# Patient Record
Sex: Male | Born: 1955
Health system: Southern US, Community
[De-identification: ages and names within clinical notes are randomized; demographics above are authoritative.]

## PROBLEM LIST (undated history)

## (undated) DIAGNOSIS — N433 Hydrocele, unspecified: Secondary | ICD-10-CM

## (undated) DIAGNOSIS — F329 Major depressive disorder, single episode, unspecified: Secondary | ICD-10-CM

## (undated) DIAGNOSIS — I10 Essential (primary) hypertension: Secondary | ICD-10-CM

## (undated) DIAGNOSIS — F1020 Alcohol dependence, uncomplicated: Secondary | ICD-10-CM

## (undated) DIAGNOSIS — D61818 Other pancytopenia: Secondary | ICD-10-CM

## (undated) DIAGNOSIS — F32A Depression, unspecified: Secondary | ICD-10-CM

## (undated) DIAGNOSIS — C4490 Unspecified malignant neoplasm of skin, unspecified: Secondary | ICD-10-CM

## (undated) DIAGNOSIS — T7422XA Child sexual abuse, confirmed, initial encounter: Secondary | ICD-10-CM

## (undated) DIAGNOSIS — S32000A Wedge compression fracture of unspecified lumbar vertebra, initial encounter for closed fracture: Secondary | ICD-10-CM

## (undated) HISTORY — PX: SKIN CANCER EXCISION: SHX779

## (undated) HISTORY — DX: Essential (primary) hypertension: I10

## (undated) HISTORY — PX: CATARACT EXTRACTION: SUR2

## (undated) HISTORY — DX: Hydrocele, unspecified: N43.3

## (undated) HISTORY — DX: Unspecified malignant neoplasm of skin, unspecified: C44.90

## (undated) HISTORY — DX: Depression, unspecified: F32.A

## (undated) HISTORY — DX: Wedge compression fracture of unspecified lumbar vertebra, initial encounter for closed fracture: S32.000A

---
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
## 2004-01-04 ENCOUNTER — Encounter: Admission: RE | Admit: 2004-01-04 | Discharge: 2004-01-04 | Payer: Self-pay | Admitting: Family Medicine

## 2004-05-12 ENCOUNTER — Ambulatory Visit: Payer: Self-pay | Admitting: Family Medicine

## 2006-09-16 DIAGNOSIS — G8929 Other chronic pain: Secondary | ICD-10-CM

## 2006-09-16 DIAGNOSIS — M545 Low back pain, unspecified: Secondary | ICD-10-CM

## 2006-09-16 DIAGNOSIS — F1020 Alcohol dependence, uncomplicated: Secondary | ICD-10-CM | POA: Insufficient documentation

## 2006-09-16 DIAGNOSIS — N509 Disorder of male genital organs, unspecified: Secondary | ICD-10-CM | POA: Insufficient documentation

## 2006-09-16 DIAGNOSIS — F1010 Alcohol abuse, uncomplicated: Secondary | ICD-10-CM

## 2006-09-16 HISTORY — DX: Alcohol dependence, uncomplicated: F10.20

## 2006-09-16 HISTORY — DX: Low back pain, unspecified: M54.50

## 2007-04-26 ENCOUNTER — Emergency Department (HOSPITAL_COMMUNITY): Admission: EM | Admit: 2007-04-26 | Discharge: 2007-04-27 | Payer: Self-pay | Admitting: Emergency Medicine

## 2008-05-10 ENCOUNTER — Ambulatory Visit: Payer: Self-pay | Admitting: Internal Medicine

## 2008-05-10 LAB — CONVERTED CEMR LAB
Albumin: 4.1 g/dL (ref 3.5–5.2)
CO2: 25 meq/L (ref 19–32)
Calcium: 9.7 mg/dL (ref 8.4–10.5)
Eosinophils Relative: 3 % (ref 0–5)
Glucose, Bld: 66 mg/dL — ABNORMAL LOW (ref 70–99)
HCT: 43.8 % (ref 39.0–52.0)
Lymphocytes Relative: 34 % (ref 12–46)
Lymphs Abs: 2.3 10*3/uL (ref 0.7–4.0)
Platelets: 281 10*3/uL (ref 150–400)
Potassium: 4.5 meq/L (ref 3.5–5.3)
Sodium: 143 meq/L (ref 135–145)
Total Protein: 6.7 g/dL (ref 6.0–8.3)
WBC: 6.8 10*3/uL (ref 4.0–10.5)

## 2008-07-12 ENCOUNTER — Emergency Department (HOSPITAL_COMMUNITY): Admission: EM | Admit: 2008-07-12 | Discharge: 2008-07-12 | Payer: Self-pay | Admitting: Emergency Medicine

## 2008-07-20 HISTORY — PX: HYDROCELE EXCISION / REPAIR: SUR1145

## 2008-09-03 ENCOUNTER — Emergency Department (HOSPITAL_COMMUNITY): Admission: EM | Admit: 2008-09-03 | Discharge: 2008-09-04 | Payer: Self-pay | Admitting: Emergency Medicine

## 2008-12-07 ENCOUNTER — Emergency Department (HOSPITAL_COMMUNITY): Admission: EM | Admit: 2008-12-07 | Discharge: 2008-12-07 | Payer: Self-pay | Admitting: Emergency Medicine

## 2009-01-18 ENCOUNTER — Encounter (INDEPENDENT_AMBULATORY_CARE_PROVIDER_SITE_OTHER): Payer: Self-pay | Admitting: Urology

## 2009-01-18 ENCOUNTER — Ambulatory Visit (HOSPITAL_BASED_OUTPATIENT_CLINIC_OR_DEPARTMENT_OTHER): Admission: RE | Admit: 2009-01-18 | Discharge: 2009-01-18 | Payer: Self-pay | Admitting: Urology

## 2009-04-10 ENCOUNTER — Ambulatory Visit: Payer: Self-pay | Admitting: Internal Medicine

## 2009-04-10 LAB — CONVERTED CEMR LAB
Lymphs Abs: 1.4 10*3/uL (ref 0.7–4.0)
Monocytes Relative: 15 % — ABNORMAL HIGH (ref 3–12)
Neutro Abs: 1.8 10*3/uL (ref 1.7–7.7)
Neutrophils Relative %: 43 % (ref 43–77)
RBC: 3.87 M/uL — ABNORMAL LOW (ref 4.22–5.81)
WBC: 4.3 10*3/uL (ref 4.0–10.5)

## 2010-10-25 ENCOUNTER — Emergency Department (HOSPITAL_COMMUNITY)
Admission: EM | Admit: 2010-10-25 | Discharge: 2010-10-25 | Disposition: A | Discharge status: Home / Self Care | Payer: Self-pay | Attending: Emergency Medicine | Admitting: Emergency Medicine

## 2010-10-25 DIAGNOSIS — R000 Tachycardia, unspecified: Secondary | ICD-10-CM | POA: Insufficient documentation

## 2010-10-25 DIAGNOSIS — R29898 Other symptoms and signs involving the musculoskeletal system: Secondary | ICD-10-CM | POA: Insufficient documentation

## 2010-10-25 DIAGNOSIS — R209 Unspecified disturbances of skin sensation: Secondary | ICD-10-CM | POA: Insufficient documentation

## 2010-10-25 LAB — GLUCOSE, CAPILLARY: Glucose-Capillary: 77 mg/dL (ref 70–99)

## 2010-10-26 LAB — RAPID URINE DRUG SCREEN, HOSP PERFORMED
Amphetamines: NOT DETECTED
Barbiturates: NOT DETECTED

## 2010-10-28 LAB — DIFFERENTIAL
Blasts: 0 %
Metamyelocytes Relative: 0 %
Monocytes Relative: 14 % — ABNORMAL HIGH (ref 3–12)
Myelocytes: 0 %
Promyelocytes Absolute: 0 %
nRBC: 0 /100 WBC

## 2010-10-28 LAB — URINALYSIS, ROUTINE W REFLEX MICROSCOPIC
Glucose, UA: NEGATIVE mg/dL
Hgb urine dipstick: NEGATIVE
Specific Gravity, Urine: 1.023 (ref 1.005–1.030)
pH: 5.5 (ref 5.0–8.0)

## 2010-10-28 LAB — CBC
HCT: 38.7 % — ABNORMAL LOW (ref 39.0–52.0)
MCV: 95.5 fL (ref 78.0–100.0)
RBC: 4.05 MIL/uL — ABNORMAL LOW (ref 4.22–5.81)
WBC: 6 10*3/uL (ref 4.0–10.5)

## 2010-10-28 LAB — URINE MICROSCOPIC-ADD ON

## 2010-10-28 LAB — COMPREHENSIVE METABOLIC PANEL
AST: 26 U/L (ref 0–37)
Alkaline Phosphatase: 65 U/L (ref 39–117)
CO2: 27 mEq/L (ref 19–32)
Chloride: 103 mEq/L (ref 96–112)
Creatinine, Ser: 0.76 mg/dL (ref 0.4–1.5)
GFR calc Af Amer: >60 mL/min (ref >60)
GFR calc non Af Amer: >60 mL/min (ref >60)
Potassium: 3.6 mEq/L (ref 3.5–5.1)
Total Bilirubin: 1 mg/dL (ref 0.3–1.2)

## 2010-10-30 ENCOUNTER — Emergency Department (HOSPITAL_COMMUNITY)
Admission: EM | Admit: 2010-10-30 | Discharge: 2010-10-30 | Disposition: A | Discharge status: Home / Self Care | Payer: Self-pay | Attending: Emergency Medicine | Admitting: Emergency Medicine

## 2010-10-30 ENCOUNTER — Emergency Department (HOSPITAL_COMMUNITY): Payer: Self-pay

## 2010-10-30 DIAGNOSIS — R5381 Other malaise: Secondary | ICD-10-CM | POA: Insufficient documentation

## 2010-10-30 DIAGNOSIS — R209 Unspecified disturbances of skin sensation: Secondary | ICD-10-CM | POA: Insufficient documentation

## 2010-10-30 DIAGNOSIS — R5383 Other fatigue: Secondary | ICD-10-CM | POA: Insufficient documentation

## 2010-10-30 DIAGNOSIS — E860 Dehydration: Secondary | ICD-10-CM | POA: Insufficient documentation

## 2010-10-30 DIAGNOSIS — R0602 Shortness of breath: Secondary | ICD-10-CM | POA: Insufficient documentation

## 2010-10-30 DIAGNOSIS — F411 Generalized anxiety disorder: Secondary | ICD-10-CM | POA: Insufficient documentation

## 2010-10-30 DIAGNOSIS — R259 Unspecified abnormal involuntary movements: Secondary | ICD-10-CM | POA: Insufficient documentation

## 2010-10-30 DIAGNOSIS — I498 Other specified cardiac arrhythmias: Secondary | ICD-10-CM | POA: Insufficient documentation

## 2010-10-30 LAB — RAPID URINE DRUG SCREEN, HOSP PERFORMED: Barbiturates: NOT DETECTED

## 2010-10-30 LAB — CBC
HCT: 46.5 % (ref 39.0–52.0)
MCHC: 34.2 g/dL (ref 30.0–36.0)
MCV: 96.9 fL (ref 78.0–100.0)
Platelets: 179 10*3/uL (ref 150–400)
RDW: 12.8 % (ref 11.5–15.5)
WBC: 6.6 10*3/uL (ref 4.0–10.5)

## 2010-10-30 LAB — URINE MICROSCOPIC-ADD ON

## 2010-10-30 LAB — URINALYSIS, ROUTINE W REFLEX MICROSCOPIC
Glucose, UA: NEGATIVE mg/dL
Hgb urine dipstick: NEGATIVE
Specific Gravity, Urine: 1.026 (ref 1.005–1.030)
pH: 5.5 (ref 5.0–8.0)

## 2010-10-30 LAB — DIFFERENTIAL
Basophils Absolute: 0 10*3/uL (ref 0.0–0.1)
Eosinophils Absolute: 0.2 10*3/uL (ref 0.0–0.7)
Eosinophils Relative: 3 % (ref 0–5)
Monocytes Absolute: 0.6 10*3/uL (ref 0.1–1.0)

## 2010-10-30 LAB — COMPREHENSIVE METABOLIC PANEL
ALT: 57 U/L — ABNORMAL HIGH (ref 0–53)
Alkaline Phosphatase: 74 U/L (ref 39–117)
BUN: 7 mg/dL (ref 6–23)
CO2: 23 mEq/L (ref 19–32)
GFR calc non Af Amer: >60 mL/min (ref >60)
Glucose, Bld: 91 mg/dL (ref 70–99)
Potassium: 4.1 mEq/L (ref 3.5–5.1)
Sodium: 133 mEq/L — ABNORMAL LOW (ref 135–145)
Total Protein: 6.8 g/dL (ref 6.0–8.3)

## 2010-10-30 LAB — POCT CARDIAC MARKERS
CKMB, poc: 2 ng/mL (ref 1.0–8.0)
Myoglobin, poc: 45.4 ng/mL (ref 12–200)

## 2010-10-30 LAB — ETHANOL: Alcohol, Ethyl (B): <5 mg/dL (ref 0–10)

## 2010-11-04 LAB — DIFFERENTIAL
Basophils Absolute: 0.1 10*3/uL (ref 0.0–0.1)
Basophils Relative: 1 % (ref 0–1)
Lymphocytes Relative: 39 % (ref 12–46)
Neutro Abs: 3.6 10*3/uL (ref 1.7–7.7)

## 2010-11-04 LAB — CBC
MCHC: 33.4 g/dL (ref 30.0–36.0)
Platelets: 296 10*3/uL (ref 150–400)
RDW: 15 % (ref 11.5–15.5)

## 2010-11-04 LAB — BASIC METABOLIC PANEL
BUN: 12 mg/dL (ref 6–23)
GFR calc Af Amer: >60 mL/min (ref >60)
GFR calc non Af Amer: >60 mL/min (ref >60)
Potassium: 4.1 mEq/L (ref 3.5–5.1)

## 2010-11-04 LAB — ETHANOL
Alcohol, Ethyl (B): 441 mg/dL (ref 0–10)
Alcohol, Ethyl (B): 337 mg/dL — ABNORMAL HIGH (ref 0–10)

## 2010-11-04 LAB — RAPID URINE DRUG SCREEN, HOSP PERFORMED
Cocaine: NOT DETECTED
Tetrahydrocannabinol: NOT DETECTED

## 2010-12-02 NOTE — Op Note (Signed)
NAME:  Anthony [name], Anthony [name]                  ACCOUNT NO.:  [id number]   MEDICAL RECORD NO.:  [id number]          PATIENT TYPE:  AMB   LOCATION:  [facility]                         FACILITY:  [facility]   PHYSICIAN:  Ronald L. Earlene Plater, M.D.  DATE OF BIRTH:  01-Dec-1955   DATE OF PROCEDURE:  01/18/2009  DATE OF DISCHARGE:                               OPERATIVE REPORT   PREOPERATIVE DIAGNOSIS:  Large left hydrocele.   POSTOPERATIVE DIAGNOSIS:  Large left hydrocele.   OPERATIVE PROCEDURE:  Left hydrocele repair.   SURGEON:  Dr. Windy Fast L. Davis.   ANESTHESIA:  LMA.   ESTIMATED BLOOD LOSS:  25 mL.   DRAINS:  A 1/4 inch Penrose drain.   COMPLICATIONS:  None.   INDICATIONS FOR PROCEDURE:  Anthony [name] is a very nice 55 year old white  male who presents with a very enlarged scrotal sac on ultrasound and was  found to have a large left hydrocele.  He notes it is been present for  some time.  It is worsened and become unsightly and uncomfortable.  After understanding the risks, benefits and alternatives, he has elected  to proceed.  He has a small hydrocele on the right, which he has elected  to leave alone at present.   DESCRIPTION OF PROCEDURE:  The patient was placed in the supine  position.  After proper LMA anesthesia, was prepped and draped with  Betadine in a sterile fashion.  A transverse left hemiscrotal incision  was made.  Sharp dissection was carried down through dartos tunica.  The  tunica vaginalis was incised and approximately 700 mL of clear straw  fluid was obtained.  A large hydrocele sac was present that was  noncommunicating. The hydrocele sac was resected off of the cord and the  surrounding structures, and the bulk of the hydrocele sac was excised  and submitted for identification only to pathology.  Good hemostasis was  noted be present.  The remaining tunica vaginalis was bottlenecked  around the cord and the adnexa with running 3-0 chromic catgut, and  there were no pockets noted.   Good hemostasis was noted to be present.  The testicle appeared to be viable and was placed into the scrotal sac.  A thorough irrigation was performed.  A 1/4 inch Penrose drain was  placed through a separate stab incision and sutured in place with  chromic catgut.  The scrotum was then closed in two layers.  The dartos  tunica  was closed with a running 3-0 chromic catgut, and the skin was closed  with a running 3-0 chromic catgut, horizontal mattress-type suture.  The  wound was dressed with collodion and then fluffs and a scrotal support.   The patient tolerated the procedure well.  No complications.  He was  taken to the recovery room stable.      Ronald L. Earlene Plater, M.D.  Electronically Signed     RLD/MEDQ  D:  01/18/2009  T:  01/18/2009  Job:  [id number]

## 2011-04-30 LAB — BASIC METABOLIC PANEL
CO2: 25
Calcium: 8.7
Creatinine, Ser: 0.78
GFR calc Af Amer: >60
GFR calc non Af Amer: >60
Glucose, Bld: 103 — ABNORMAL HIGH
Sodium: 141

## 2011-04-30 LAB — I-STAT 8, (EC8 V) (CONVERTED LAB)
Chloride: 107
HCT: 52
Hemoglobin: 17.7 — ABNORMAL HIGH
Potassium: 4
Sodium: 140
TCO2: 26

## 2011-04-30 LAB — RAPID URINE DRUG SCREEN, HOSP PERFORMED
Barbiturates: NOT DETECTED
Opiates: NOT DETECTED

## 2011-04-30 LAB — CBC
Hemoglobin: 15.8
MCHC: 34.1
RBC: 4.65
RDW: 14.1 — ABNORMAL HIGH

## 2011-04-30 LAB — DIFFERENTIAL
Basophils Absolute: 0.1
Basophils Relative: 2 — ABNORMAL HIGH
Lymphocytes Relative: 43
Monocytes Absolute: 0.5
Neutro Abs: 2.4
Neutrophils Relative %: 39 — ABNORMAL LOW

## 2011-05-18 ENCOUNTER — Emergency Department (HOSPITAL_COMMUNITY): Payer: Worker's Compensation

## 2011-05-18 ENCOUNTER — Emergency Department (HOSPITAL_COMMUNITY)
Admission: EM | Admit: 2011-05-18 | Discharge: 2011-05-18 | Disposition: A | Discharge status: Home / Self Care | Payer: Worker's Compensation | Attending: Emergency Medicine | Admitting: Emergency Medicine

## 2011-05-18 DIAGNOSIS — Y990 Civilian activity done for income or pay: Secondary | ICD-10-CM | POA: Insufficient documentation

## 2011-05-18 DIAGNOSIS — M79609 Pain in unspecified limb: Secondary | ICD-10-CM | POA: Insufficient documentation

## 2011-05-18 DIAGNOSIS — W230XXA Caught, crushed, jammed, or pinched between moving objects, initial encounter: Secondary | ICD-10-CM | POA: Insufficient documentation

## 2011-05-18 DIAGNOSIS — IMO0002 Reserved for concepts with insufficient information to code with codable children: Secondary | ICD-10-CM | POA: Insufficient documentation

## 2011-05-29 ENCOUNTER — Telehealth (HOSPITAL_COMMUNITY): Payer: Self-pay | Admitting: *Deleted

## 2012-03-25 ENCOUNTER — Telehealth: Payer: Self-pay | Admitting: Family Medicine

## 2012-03-25 NOTE — Telephone Encounter (Signed)
Patient called in. He has not yet received a call about a new patient. He came this afternoon to speak to someone about his status and the office was closed. Please call the patient to update regarding this.

## 2012-04-11 ENCOUNTER — Encounter: Payer: Self-pay | Admitting: Family Medicine

## 2012-04-11 ENCOUNTER — Ambulatory Visit (INDEPENDENT_AMBULATORY_CARE_PROVIDER_SITE_OTHER): Payer: Self-pay | Admitting: Family Medicine

## 2012-04-11 VITALS — BP 138/98 | HR 82 | Temp 97.9°F | Ht 70.0 in | Wt 181.6 lb

## 2012-04-11 DIAGNOSIS — I10 Essential (primary) hypertension: Secondary | ICD-10-CM

## 2012-04-11 DIAGNOSIS — F1010 Alcohol abuse, uncomplicated: Secondary | ICD-10-CM

## 2012-04-11 MED ORDER — HYDROCHLOROTHIAZIDE 12.5 MG PO TABS: 12.5000 mg | ORAL_TABLET | Freq: Every day | ORAL | Status: DC

## 2012-04-11 NOTE — Assessment & Plan Note (Signed)
Multiple elevated blood pressures measured at plasma donation site as well as here today. Will start on HCTZ 12.5 mg. FU in 4-6 weeks for nurse visit to recheck blood pressure.

## 2012-04-11 NOTE — Patient Instructions (Addendum)
Come back in 4-6 weeks for a blood pressure check.   We can check some basic labs at that time as well once you have obtained the [facility].   Take the HCTZ 12.5 mg once daily.   It was good to meet you today.    Hypertension As your heart beats, it forces blood through your arteries. This force is your blood pressure. If the pressure is too high, it is called hypertension (HTN) or high blood pressure. HTN is dangerous because you may have it and not know it. High blood pressure may mean that your heart has to work harder to pump blood. Your arteries may be narrow or stiff. The extra work puts you at risk for heart disease, stroke, and other problems.  Blood pressure consists of two numbers, a higher number over a lower, 110/72, for example. It is stated as "110 over 72." The ideal is below 120 for the top number (systolic) and under 80 for the bottom (diastolic). Write down your blood pressure today. You should pay close attention to your blood pressure if you have certain conditions such as:  Heart failure.   Prior heart attack.   Diabetes   Chronic kidney disease.   Prior stroke.   Multiple risk factors for heart disease.  To see if you have HTN, your blood pressure should be measured while you are seated with your arm held at the level of the heart. It should be measured at least twice. A one-time elevated blood pressure reading (especially in the Emergency Department) does not mean that you need treatment. There may be conditions in which the blood pressure is different between your right and left arms. It is important to see your caregiver soon for a recheck. Most people have essential hypertension which means that there is not a specific cause. This type of high blood pressure may be lowered by changing lifestyle factors such as:  Stress.   Smoking.   Lack of exercise.   Excessive weight.   Drug/tobacco/alcohol use.   Eating less salt.  Most people do not have symptoms  from high blood pressure until it has caused damage to the body. Effective treatment can often prevent, delay or reduce that damage. TREATMENT  When a cause has been identified, treatment for high blood pressure is directed at the cause. There are a large number of medications to treat HTN. These fall into several categories, and your caregiver will help you select the medicines that are best for you. Medications may have side effects. You should review side effects with your caregiver. If your blood pressure stays high after you have made lifestyle changes or started on medicines,   Your medication(s) may need to be changed.   Other problems may need to be addressed.   Be certain you understand your prescriptions, and know how and when to take your medicine.   Be sure to follow up with your caregiver within the time frame advised (usually within two weeks) to have your blood pressure rechecked and to review your medications.   If you are taking more than one medicine to lower your blood pressure, make sure you know how and at what times they should be taken. Taking two medicines at the same time can result in blood pressure that is too low.  SEEK IMMEDIATE MEDICAL CARE IF:  You develop a severe headache, blurred or changing vision, or confusion.   You have unusual weakness or numbness, or a faint feeling.  You have severe chest or abdominal pain, vomiting, or breathing problems.  MAKE SURE YOU:   Understand these instructions.   Will watch your condition.   Will get help right away if you are not doing well or get worse.  Document Released: 07/06/2005 Document Revised: 06/25/2011 Document Reviewed: 02/24/2008 [facility] Patient Information 2012 [place], [place].

## 2012-04-11 NOTE — Assessment & Plan Note (Signed)
Drinking heavily this summer, however now down to 1-2 beers a week.  CAGE negative.   Will continue to screen at further visits.

## 2012-04-11 NOTE — Progress Notes (Signed)
Patient ID: Nell Range, male   DOB: 06-11-1956, 56 y.o.   MRN: [id number] Truth A Russman is a 56 y.o. male who presents to [facility] today to re-establish care.  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  Was denied any further plasma donation about 6 weeks ago due to elevated blood pressure.  He presents for care to have this checked.  Denies any other medical issues or concerns.    Elevated blood pressures:  Unclear how long he has had this problem. No formal diagnosis of HTN, but he has had increased blood pressures multiple visits at plasma donation site.  Not checking it regularly.  No HA, CP, dizziness, shortness of breath, palpitations, or LE swelling.   BP Readings from Last 3 Encounters:  04/11/12 138/98   The following portions of the patient's history were reviewed and updated as appropriate: allergies, current medications, past medical history, family and social history, and problem list.  Patient is a nonsmoker.  No past medical history on file.  ROS as above otherwise neg. No Chest pain, palpitations, SOB, Fever, Chills, Abd pain, N/V/D.   Medications reviewed. No current outpatient prescriptions on file.    Exam:  BP 157/99  Pulse 82  Temp 97.9 F (36.6 C) (Oral)  Ht 5\' 10"  (1.778 m)  Wt 181 lb 9.6 oz (82.373 kg)  BMI 26.06 kg/m2 Gen: Well NAD HEENT:  [place]/AT.  EOMI, PERRL.  MMM, tonsils non-erythematous, non-edematous.  External ears WNL, Bilateral TM's normal without retraction, redness or bulging.  Cardiac:  Regular rate and rhythm without murmur auscultated.  Good S1/S2. Pulm:  Clear to auscultation bilaterally with good air movement.  No wheezes or rales noted.   Abd:  Soft/nondistended/nontender.  Good bowel sounds throughout all four quadrants.  No masses noted.  Exts: Non edematous BL  LE, warm and well perfused.  Right LE with varicose veins noted, non-thrombosed, non painful,  nonerythematous.   Psych:  Pleasant, not depressed, not anxious appearing   No results found for this or any previous visit (from the past 72 hour(s)).

## 2012-04-25 ENCOUNTER — Ambulatory Visit (INDEPENDENT_AMBULATORY_CARE_PROVIDER_SITE_OTHER): Payer: Self-pay | Admitting: *Deleted

## 2012-04-25 VITALS — BP 144/92 | HR 84

## 2012-04-25 DIAGNOSIS — I10 Essential (primary) hypertension: Secondary | ICD-10-CM

## 2012-04-25 NOTE — Progress Notes (Signed)
Patient notified and appointment scheduled for 05/09/2012.

## 2012-04-25 NOTE — Progress Notes (Signed)
Patient in for BP check as requested by Dr. Gwendolyn Grant at last visit. BP checked manually using regular adult cuff. BP RA 144/92 and LA 140/96 pulse 84. Taking medication as directed. Will forward to MD and call patient  back at [phone].

## 2012-04-25 NOTE — Progress Notes (Signed)
Will continue medication at current dosage.  Will follow up at next visit, if still elevated will consider increasing dose at that time.

## 2012-05-09 ENCOUNTER — Encounter: Payer: Self-pay | Admitting: Family Medicine

## 2012-05-09 ENCOUNTER — Ambulatory Visit (INDEPENDENT_AMBULATORY_CARE_PROVIDER_SITE_OTHER): Payer: Self-pay | Admitting: Family Medicine

## 2012-05-09 VITALS — BP 122/78 | HR 79 | Temp 98.0°F | Ht 70.0 in | Wt 175.0 lb

## 2012-05-09 DIAGNOSIS — I10 Essential (primary) hypertension: Secondary | ICD-10-CM

## 2012-05-09 DIAGNOSIS — F1010 Alcohol abuse, uncomplicated: Secondary | ICD-10-CM

## 2012-05-09 MED ORDER — HYDROCHLOROTHIAZIDE 12.5 MG PO CAPS: 12.5000 mg | ORAL_CAPSULE | Freq: Every day | ORAL | Status: DC

## 2012-05-09 MED ORDER — HYDROCHLOROTHIAZIDE 25 MG PO TABS: 25.0000 mg | ORAL_TABLET | Freq: Every day | ORAL | Status: DC

## 2012-05-09 NOTE — Patient Instructions (Signed)
Call here to speak to Britta Mccreedy to get the [employer] up.  We have increased your dose of HCTZ (blood pressure medicine) to 25 mg daily.  Take 2 of your current pills until they are gone and then you can start the new prescription.  Follow up with me in 6 - 8 weeks, sometime around Christmas.  It was good to see you today.

## 2012-05-09 NOTE — Assessment & Plan Note (Signed)
Patient states he "still drinks some."   CAGE negative. States he has never been late for work as long as he's been working (most of his life).   Does not feel alcohol negatively affects his life. Will continue to screen at follow-up visits.

## 2012-05-09 NOTE — Progress Notes (Signed)
  Subjective:    Patient ID: Anthony [name], male    DOB: Jan 28, 1956, 56 y.o.   MRN: [id number]  HPI  1.  Hypertension:  Long-term problem for this patient.  No adverse effects from medication, just started on HCTZ 12.5 mg last visit after not being on BP medications prior.  Seen at nurse visit on 10/7 with BP as below.  Has "missed a few pills" since nurse visit.  Not checking it regularly on his own.  No HA, CP, dizziness, shortness of breath, palpitations, or LE swelling.   BP Readings from Last 3 Encounters:  05/09/12 151/95  04/25/12 144/92  04/11/12 138/98    Review of Systems See HPI above for review of systems.       Objective:   Physical Exam Gen:  Alert, cooperative patient who appears stated age in no acute distress.  Vital signs reviewed. Cardiac:  Regular rate and rhythm without murmur auscultated.  Good S1/S2. Pulm:  Clear to auscultation bilaterally with good air movement.  No wheezes or rales noted.   Abd:  Soft/nondistended/nontender.  Good bowel sounds throughout all four quadrants.  No masses noted.  Ext:  No edema noted bilateral lower extremities.          Assessment & Plan:

## 2012-05-09 NOTE — Assessment & Plan Note (Addendum)
Repeat BP at goal. 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 Continue HCTZ at 12.5 mg.  FU in 6 - 8 weeks around Christmas time for recheck.

## 2012-08-04 ENCOUNTER — Emergency Department (HOSPITAL_COMMUNITY)
Admission: EM | Admit: 2012-08-04 | Discharge: 2012-08-04 | Disposition: A | Discharge status: Home / Self Care | Payer: Self-pay | Attending: Emergency Medicine | Admitting: Emergency Medicine

## 2012-08-04 ENCOUNTER — Emergency Department (HOSPITAL_COMMUNITY): Payer: Self-pay

## 2012-08-04 ENCOUNTER — Encounter (HOSPITAL_COMMUNITY): Payer: Self-pay | Admitting: *Deleted

## 2012-08-04 DIAGNOSIS — I10 Essential (primary) hypertension: Secondary | ICD-10-CM | POA: Insufficient documentation

## 2012-08-04 DIAGNOSIS — Z87891 Personal history of nicotine dependence: Secondary | ICD-10-CM | POA: Insufficient documentation

## 2012-08-04 DIAGNOSIS — J069 Acute upper respiratory infection, unspecified: Secondary | ICD-10-CM | POA: Insufficient documentation

## 2012-08-04 DIAGNOSIS — F1010 Alcohol abuse, uncomplicated: Secondary | ICD-10-CM

## 2012-08-04 DIAGNOSIS — Z79899 Other long term (current) drug therapy: Secondary | ICD-10-CM | POA: Insufficient documentation

## 2012-08-04 DIAGNOSIS — R079 Chest pain, unspecified: Secondary | ICD-10-CM

## 2012-08-04 DIAGNOSIS — Z7982 Long term (current) use of aspirin: Secondary | ICD-10-CM | POA: Insufficient documentation

## 2012-08-04 LAB — CBC WITH DIFFERENTIAL/PLATELET
Basophils Absolute: 0 10*3/uL (ref 0.0–0.1)
Basophils Relative: 1 % (ref 0–1)
Eosinophils Absolute: 0 10*3/uL (ref 0.0–0.7)
Eosinophils Relative: 1 % (ref 0–5)
HCT: 45.3 % (ref 39.0–52.0)
Hemoglobin: 15.8 g/dL (ref 13.0–17.0)
Lymphocytes Relative: 32 % (ref 12–46)
Lymphs Abs: 2.8 10*3/uL (ref 0.7–4.0)
MCH: 33.8 pg (ref 26.0–34.0)
MCHC: 34.9 g/dL (ref 30.0–36.0)
MCV: 97 fL (ref 78.0–100.0)
Monocytes Absolute: 0.9 10*3/uL (ref 0.1–1.0)
Monocytes Relative: 11 % (ref 3–12)
Neutro Abs: 4.8 10*3/uL (ref 1.7–7.7)
Neutrophils Relative %: 56 % (ref 43–77)
Platelets: 296 10*3/uL (ref 150–400)
RBC: 4.67 MIL/uL (ref 4.22–5.81)
RDW: 12.3 % (ref 11.5–15.5)
WBC: 8.5 10*3/uL (ref 4.0–10.5)

## 2012-08-04 LAB — COMPREHENSIVE METABOLIC PANEL WITH GFR
ALT: 22 U/L (ref 0–53)
AST: 38 U/L — ABNORMAL HIGH (ref 0–37)
Albumin: 3.4 g/dL — ABNORMAL LOW (ref 3.5–5.2)
Alkaline Phosphatase: 60 U/L (ref 39–117)
BUN: 9 mg/dL (ref 6–23)
CO2: 25 meq/L (ref 19–32)
Calcium: 8.7 mg/dL (ref 8.4–10.5)
Chloride: 102 meq/L (ref 96–112)
Creatinine, Ser: 0.72 mg/dL (ref 0.50–1.35)
GFR calc Af Amer: >90 mL/min
GFR calc non Af Amer: >90 mL/min
Glucose, Bld: 97 mg/dL (ref 70–99)
Potassium: 3.9 meq/L (ref 3.5–5.1)
Sodium: 142 meq/L (ref 135–145)
Total Bilirubin: 0.2 mg/dL — ABNORMAL LOW (ref 0.3–1.2)
Total Protein: 6.8 g/dL (ref 6.0–8.3)

## 2012-08-04 MED ORDER — LORAZEPAM 1 MG PO TABS: 1.0000 mg | ORAL_TABLET | Freq: Three times a day (TID) | ORAL | Status: DC | PRN

## 2012-08-04 NOTE — ED Notes (Signed)
EDP at bedside  

## 2012-08-04 NOTE — ED Notes (Addendum)
Pt with sore throat and malaise since 12/24.  However, cough/myalgias/weakness and sore throat increased markedly last week to where he was unable to go to work.  Yesterday he noticed swelling to the lymph nodes below R jaw.  Pt states he has not been able to cough since yesterday d/t increased pain to L lat chest when coughing.

## 2012-08-04 NOTE — ED Notes (Signed)
Security at door.

## 2012-08-04 NOTE — ED Notes (Signed)
Patient transported to X-ray 

## 2012-08-04 NOTE — ED Notes (Addendum)
Pt seems to be aggravated, stating he is "pissed off" states he has been waiting for 3 hrs, pt has been in ED for a total of 1.5 hrs at this point. He is not appropriate for situation, verbally or physically.

## 2012-08-04 NOTE — ED Notes (Addendum)
Answered call bell. Patient expressing displeasure that "nobody has been to see me yet". Explained to patient that MD would be with him within . Patient stated that he was angry and that if he could he would get out of bed and "pop you in the face". De-escalation technique used. Patient still verbally abusive. 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

## 2012-08-05 ENCOUNTER — Emergency Department (HOSPITAL_COMMUNITY): Payer: Self-pay

## 2012-08-05 ENCOUNTER — Emergency Department (HOSPITAL_COMMUNITY)
Admission: EM | Admit: 2012-08-05 | Discharge: 2012-08-05 | Discharge status: Against Medical Advice | Payer: Self-pay | Attending: Emergency Medicine | Admitting: Emergency Medicine

## 2012-08-05 ENCOUNTER — Encounter (HOSPITAL_COMMUNITY): Payer: Self-pay | Admitting: Family Medicine

## 2012-08-05 DIAGNOSIS — R42 Dizziness and giddiness: Secondary | ICD-10-CM | POA: Insufficient documentation

## 2012-08-05 DIAGNOSIS — S060X9A Concussion with loss of consciousness of unspecified duration, initial encounter: Secondary | ICD-10-CM | POA: Insufficient documentation

## 2012-08-05 DIAGNOSIS — R079 Chest pain, unspecified: Secondary | ICD-10-CM | POA: Insufficient documentation

## 2012-08-05 DIAGNOSIS — Z87891 Personal history of nicotine dependence: Secondary | ICD-10-CM | POA: Insufficient documentation

## 2012-08-05 DIAGNOSIS — I10 Essential (primary) hypertension: Secondary | ICD-10-CM | POA: Insufficient documentation

## 2012-08-05 DIAGNOSIS — W108XXA Fall (on) (from) other stairs and steps, initial encounter: Secondary | ICD-10-CM | POA: Insufficient documentation

## 2012-08-05 DIAGNOSIS — R55 Syncope and collapse: Secondary | ICD-10-CM | POA: Insufficient documentation

## 2012-08-05 DIAGNOSIS — Y9301 Activity, walking, marching and hiking: Secondary | ICD-10-CM | POA: Insufficient documentation

## 2012-08-05 DIAGNOSIS — R112 Nausea with vomiting, unspecified: Secondary | ICD-10-CM | POA: Insufficient documentation

## 2012-08-05 DIAGNOSIS — F1010 Alcohol abuse, uncomplicated: Secondary | ICD-10-CM | POA: Insufficient documentation

## 2012-08-05 DIAGNOSIS — Z79899 Other long term (current) drug therapy: Secondary | ICD-10-CM | POA: Insufficient documentation

## 2012-08-05 DIAGNOSIS — J3489 Other specified disorders of nose and nasal sinuses: Secondary | ICD-10-CM | POA: Insufficient documentation

## 2012-08-05 DIAGNOSIS — S0990XA Unspecified injury of head, initial encounter: Secondary | ICD-10-CM

## 2012-08-05 DIAGNOSIS — Y92009 Unspecified place in unspecified non-institutional (private) residence as the place of occurrence of the external cause: Secondary | ICD-10-CM | POA: Insufficient documentation

## 2012-08-05 DIAGNOSIS — R509 Fever, unspecified: Secondary | ICD-10-CM | POA: Insufficient documentation

## 2012-08-05 LAB — CBC
MCH: 34.6 pg — ABNORMAL HIGH (ref 26.0–34.0)
MCHC: 35.2 g/dL (ref 30.0–36.0)
MCV: 98.3 fL (ref 78.0–100.0)
Platelets: 227 10*3/uL (ref 150–400)
RBC: 4.13 MIL/uL — ABNORMAL LOW (ref 4.22–5.81)
RDW: 12.5 % (ref 11.5–15.5)

## 2012-08-05 LAB — COMPREHENSIVE METABOLIC PANEL
ALT: 20 U/L (ref 0–53)
AST: 36 U/L (ref 0–37)
Albumin: 2.9 g/dL — ABNORMAL LOW (ref 3.5–5.2)
Calcium: 7.6 mg/dL — ABNORMAL LOW (ref 8.4–10.5)
Creatinine, Ser: 0.72 mg/dL (ref 0.50–1.35)
GFR calc non Af Amer: >90 mL/min (ref >90)
Sodium: 143 mEq/L (ref 135–145)
Total Protein: 6.2 g/dL (ref 6.0–8.3)

## 2012-08-05 LAB — ETHANOL: Alcohol, Ethyl (B): 385 mg/dL — ABNORMAL HIGH (ref 0–11)

## 2012-08-05 LAB — GLUCOSE, CAPILLARY: Glucose-Capillary: 81 mg/dL (ref 70–99)

## 2012-08-05 MED ORDER — SODIUM CHLORIDE 0.9 % IV BOLUS (SEPSIS)
1000.0000 mL | Freq: Once | INTRAVENOUS | Status: AC
  Administered 2012-08-05: 1000 mL via INTRAVENOUS

## 2012-08-05 MED ORDER — SODIUM CHLORIDE 0.9 % IV BOLUS (SEPSIS): 1000.0000 mL | Freq: Once | INTRAVENOUS | Status: DC

## 2012-08-05 NOTE — ED Notes (Signed)
Per pt sts was discharged from here yesterday and walked all the way home. sts today he was really sick and passed out and hit his head. Pt also admits to drinking ETOH. sts that we should have never sent him home that he was really sick and he is going to sue. Pt has abrasion to right forehead and sts pain in right jaw. sts also chest pain and it hurts to cough and take a deep breath. Pt keeps repeating himself.

## 2012-08-05 NOTE — ED Notes (Signed)
CBG 81.  

## 2012-08-05 NOTE — ED Provider Notes (Signed)
History     CSN: [id number]  Arrival date & time 08/05/12  1826   First MD Initiated Contact with Patient 08/05/12 1834      Chief Complaint  Patient presents with  . Loss of Consciousness    (Consider location/radiation/quality/duration/timing/severity/associated sxs/prior treatment) HPI Comments: 57 y/o male h/o htn p/w syncope. Patient admits to etoh today. Was at home when he fell down 5 steps. Also reports some chest pain, neck pain, n/v/d. History limited 2/2 etoh.  Patient is a 57 y.o. male presenting with fall. The history is provided by the patient and the EMS personnel. The history is limited by the condition of the patient.  Fall Incident onset: patient states he is not sure when he fell. The fall occurred while walking. Distance fallen: down 5 stairs. There was no blood loss. Point of impact: uncertain. Pain location: left chest pain. The pain is moderate. There was alcohol use involved in the accident. Associated symptoms include a fever (subjective), nausea, vomiting and loss of consciousness. Pertinent negatives include no abdominal pain, no hematuria and no headaches. Treatment on scene includes IV fluid.    Past Medical History  Diagnosis Date  . Hypertension     Past Surgical History  Procedure Date  . Hydrocele excision / repair 2010    History reviewed. No pertinent family history.  History  Substance Use Topics  . 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 Smokeless tobacco: Never Used  . Alcohol Use: 25.2 oz/week    42 Cans of beer per week     Comment: and liquor on the weekends      Review of Systems  Constitutional: Positive for fever (subjective). Negative for chills.  HENT: Positive for congestion and rhinorrhea. Negative for neck pain and neck stiffness.   Eyes: Negative for visual disturbance.  Respiratory: Negative for cough and shortness of breath.   Cardiovascular: Positive for chest pain. Negative for leg swelling.  Gastrointestinal: Positive  for nausea and vomiting. Negative for abdominal pain.  Genitourinary: Negative for dysuria, hematuria, flank pain and difficulty urinating.  Neurological: Positive for loss of consciousness, syncope and light-headedness. Negative for weakness and headaches.  All other systems reviewed and are negative.    Allergies  Review of patient's allergies indicates no known allergies.  Home Medications   Current Outpatient Rx  Name  Route  Sig  Dispense  Refill  . ACETAMINOPHEN 325 MG PO TABS   Oral   Take 650 mg by mouth at bedtime as needed. For pain         . ASPIRIN EC 325 MG PO TBEC   Oral   Take 325 mg by mouth at bedtime as needed. For pain         . HYDROCHLOROTHIAZIDE 12.5 MG PO CAPS   Oral   Take 12.5 mg by mouth every morning.           BP 140/86  Pulse 120  Resp 16  SpO2 98%  Physical Exam  Nursing note and vitals reviewed. Constitutional: He is oriented to person, place, and time. He appears well-developed. No distress.  HENT:  Head: Normocephalic.  Mouth/Throat: Oropharynx is clear and moist.       Mild abrasion/ecchymosis to right forehead.  Eyes: Conjunctivae normal and EOM are normal. Pupils are equal, round, and reactive to light.  Neck: No tracheal deviation present.  Cardiovascular: Regular rhythm, normal heart sounds and intact distal pulses.        tachycardic  Pulmonary/Chest: Effort normal  and breath sounds normal. No stridor. No respiratory distress. He has no wheezes. He has no rales. He exhibits no tenderness.  Abdominal: Soft. He exhibits no distension. There is no tenderness. There is no rebound and no guarding.  Musculoskeletal: He exhibits no edema and no tenderness.       Cervical back: Normal. He exhibits no tenderness, no bony tenderness, no deformity and no laceration.       Thoracic back: Normal. He exhibits no tenderness, no bony tenderness, no deformity and no laceration.       Lumbar back: Normal. He exhibits no tenderness, no bony  tenderness, no deformity and no laceration.  Neurological: He is alert and oriented to person, place, and time.  Skin: Skin is warm and dry. He is not diaphoretic.    ED Course  Procedures (including critical care time)  Labs Reviewed  CBC - Abnormal; Notable for the following:    RBC 4.13 (*)     MCH 34.6 (*)     All other components within normal limits  COMPREHENSIVE METABOLIC PANEL - Abnormal; Notable for the following:    Glucose, Bld 122 (*)     Calcium 7.6 (*)     Albumin 2.9 (*)     Total Bilirubin 0.2 (*)     All other components within normal limits  ETHANOL - Abnormal; Notable for the following:    Alcohol, Ethyl (B) 385 (*)     All other components within normal limits  TROPONIN I  GLUCOSE, CAPILLARY   Dg Chest 2 View  08/04/2012  *RADIOLOGY REPORT*  Clinical Data: Upper respiratory infection.  Cough, chest pain.  CHEST - 2 VIEW  Comparison: 10/30/2010  Findings: Heart and mediastinal contours are within normal limits. No focal opacities or effusions.  No acute bony abnormality.  IMPRESSION: No active cardiopulmonary disease.   Original Report Authenticated By: Charlett Nose, M.D.    Ct Head Wo Contrast  08/05/2012  *RADIOLOGY REPORT*  Clinical Data:  57 year old male with syncope and head and neck injury.  Headache and neck pain.  CT HEAD WITHOUT CONTRAST CT CERVICAL SPINE WITHOUT CONTRAST  Technique:  Multidetector CT imaging of the head and cervical spine was performed following the standard protocol without intravenous contrast.  Multiplanar CT image reconstructions of the cervical spine were also generated.  Comparison:  None  CT HEAD  Findings: No intracranial abnormalities are identified, including mass lesion or mass effect, hydrocephalus, extra-axial fluid collection, midline shift, hemorrhage, or acute infarction.  The visualized bony calvarium is unremarkable.  IMPRESSION: No evidence of intracranial or acute abnormality.  CT CERVICAL SPINE  Findings: Normal  alignment is noted. There is no evidence of fracture, subluxation or prevertebral soft tissue swelling. Mild to moderate multilevel degenerative disc disease and spondylosis noted. No focal bony lesions are identified. The soft tissue structures are unremarkable.  IMPRESSION: No static evidence of acute injury to the cervical spine.  Mild to moderate multilevel degenerative changes.   Original Report Authenticated By: Harmon Pier, M.D.    Ct Cervical Spine Wo Contrast  08/05/2012  *RADIOLOGY REPORT*  Clinical Data:  57 year old male with syncope and head and neck injury.  Headache and neck pain.  CT HEAD WITHOUT CONTRAST CT CERVICAL SPINE WITHOUT CONTRAST  Technique:  Multidetector CT imaging of the head and cervical spine was performed following the standard protocol without intravenous contrast.  Multiplanar CT image reconstructions of the cervical spine were also generated.  Comparison:  None  CT HEAD  Findings:  No intracranial abnormalities are identified, including mass lesion or mass effect, hydrocephalus, extra-axial fluid collection, midline shift, hemorrhage, or acute infarction.  The visualized bony calvarium is unremarkable.  IMPRESSION: No evidence of intracranial or acute abnormality.  CT CERVICAL SPINE  Findings: Normal alignment is noted. There is no evidence of fracture, subluxation or prevertebral soft tissue swelling. Mild to moderate multilevel degenerative disc disease and spondylosis noted. No focal bony lesions are identified. The soft tissue structures are unremarkable.  IMPRESSION: No static evidence of acute injury to the cervical spine.  Mild to moderate multilevel degenerative changes.   Original Report Authenticated By: Harmon Pier, M.D.    Dg Chest Portable 1 View  08/05/2012  *RADIOLOGY REPORT*  Clinical Data: Loss of consciousness  PORTABLE CHEST - 1 VIEW  Comparison: 08/04/2012  Findings: Low lung volumes.  Heart size within normal limits for portable technique.  Mediastinal and  hilar contours normal.  The lungs are clear.  No visible pleural effusion or pneumothorax.  No acute osseous abnormality.  IMPRESSION: Low lung volumes.  No acute findings.   Original Report Authenticated By: Britta Mccreedy, M.D.      1. Alcohol abuse, unspecified   2. Syncope   3. Head injury     Date: 08/05/2012  Rate: 121  Rhythm: sinus tachycardia  QRS Axis: normal  Intervals: normal  ST/T Wave abnormalities: normal  Conduction Disutrbances:none  Narrative Interpretation:   Old EKG Reviewed: unchanged     MDM   57 y/o male p/w syncope. Reports etoh use. Abrasion to head. Now A&Ox4. Uncertain source of syncope. Likely 2/2 etoh use.  EKG as above. Unremarkable. Patient denies any active chest pain. Reportedly had some yesterday. No diaphoresis or SOB. Troponin negative. Doubt ACS. CT head and c-spine negative for injury or intracranial pathology.  No reported h/o seizure like activity. No h/o sz's. Patient asking to go home. Does not want to wait for labs.  Discussed with patient importance of staying for further w/u of his syncope and tachycardia. Patient refusing to stay. Is A&Ox4. Has decision making capacity. Refused to sign AMA papers. Discussed risks with patient. Patient left ED without paperwork.  Labs and imaging reviewed by myself and considered in medical decision making if ordered. Imaging interpreted by radiology.   Discussed case with Dr. Rubin Payor who is in agreement with assessment and plan.      Stevie Kern, MD 08/06/12 [phone]

## 2012-08-05 NOTE — ED Notes (Addendum)
After speaking with ED Resident and EDP, Pt advocate and myself, pt has decided to leave AMA. Pt states taking bus to get home. EDP and ED Resident aware.

## 2012-08-05 NOTE — ED Notes (Signed)
Per EMS, pt had syncopal episode earlier and hit head. ETOH on board. Was here yesterday for flu-like symptoms. sts SOB, V,D. Per EMS, pt orthostatic with HR in the 150s upon standing. IV left hand. sts chest and jaw hurt.

## 2012-08-05 NOTE — ED Notes (Signed)
Patient transported to CT 

## 2012-08-05 NOTE — ED Notes (Addendum)
Pt being inappropriate with staff, pt redirected multiple times and given explanations for lab work, radiology exams, and vital signs. Pt declining lab work at this time, pt states "I came here because I am sick and I feel like can't breathe, not to get a bunch of xrays that I don't want." Pt speaking in full sentences, RR15 even and unlabored, SpO2 99%. EDP and Pt advocate aware and at bedside.

## 2012-08-06 NOTE — ED Provider Notes (Signed)
I saw and evaluated the patient, reviewed the resident's note and I agree with the findings and plan and agree with their ECG interpretation. Patient with a syncopal episode. Also has tachycardia. Lab work at was back was reassuring. Patient does not want workup of his passing out. He states he just wants his flu cured. Patient does not want to stay. He is aware the wrist. Appears to have the capacity to make the decision.  Juliet Rude. Rubin Payor, MD 08/06/12 2317

## 2012-08-10 NOTE — ED Provider Notes (Signed)
History    57 y male with multiple complaints. Patient is primarily complaining of left-sided chest pain. Patient is not exactly sure when this started as at least the past several days. Pain is worse with deep breathing, coughing and certain movements. Denies any specific trauma to this area but does admit to frequent falls. Patient abuses alcohol. Occasional cough. Nonproductive. No fevers or chills. No shortness of breath. No unusual leg pain or swelling. CSN: [id number]  Arrival date & time 08/04/12  1650   First MD Initiated Contact with Patient 08/04/12 1900      Chief Complaint  Patient presents with  . URI    (Consider location/radiation/quality/duration/timing/severity/associated sxs/prior treatment) HPI  Past Medical History  Diagnosis Date  . Hypertension     Past Surgical History  Procedure Date  . Hydrocele excision / repair 2010    No family history on file.  History  Substance Use Topics  . 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 Smokeless tobacco: Never Used  . Alcohol Use: 25.2 oz/week    42 Cans of beer per week     Comment: and liquor on the weekends      Review of Systems  All systems reviewed and negative, other than as noted in HPI.   Allergies  Review of patient's allergies indicates no known allergies.  Home Medications   Current Outpatient Rx  Name  Route  Sig  Dispense  Refill  . ACETAMINOPHEN 325 MG PO TABS   Oral   Take 650 mg by mouth at bedtime as needed. For pain         . ASPIRIN EC 325 MG PO TBEC   Oral   Take 325 mg by mouth at bedtime as needed. For pain         . HYDROCHLOROTHIAZIDE 12.5 MG PO CAPS   Oral   Take 12.5 mg by mouth every morning.           BP 143/94  Pulse 140  Temp 98.2 F (36.8 C) (Oral)  Resp 18  SpO2 99%  Physical Exam  Nursing note and vitals reviewed. Constitutional: He is oriented to person, place, and time. He appears well-developed and well-nourished. No distress.  HENT:  Head:  Normocephalic and atraumatic.  Eyes: Conjunctivae normal are normal. Right eye exhibits no discharge. Left eye exhibits no discharge.  Neck: Neck supple.  Cardiovascular: Normal rate, regular rhythm and normal heart sounds.  Exam reveals no gallop and no friction rub.   No murmur heard. Pulmonary/Chest: Effort normal and breath sounds normal. No respiratory distress. He exhibits tenderness.       Area of ecchymosis and left axillary region with localized tenderness. No crepitus. Breath sounds were symmetric bilaterally with good air movement.  Abdominal: Soft. He exhibits no distension. There is no tenderness.  Musculoskeletal: He exhibits no edema and no tenderness.  Neurological: He is alert and oriented to person, place, and time. No cranial nerve deficit. He exhibits normal muscle tone. Coordination normal.  Skin: Skin is warm and dry. He is not diaphoretic.  Psychiatric: His behavior is normal. Thought content normal.    ED Course  Procedures (including critical care time)  Labs Reviewed  COMPREHENSIVE METABOLIC PANEL - Abnormal; Notable for the following:    Albumin 3.4 (*)     AST 38 (*)     Total Bilirubin 0.2 (*)     All other components within normal limits  CBC WITH DIFFERENTIAL  LAB REPORT - SCANNED  No results found.  EKG:  Rhythm: sinus tach Vent. rate 103 BPM PR interval 158 ms QRS duration 88 ms QT/QTc 344/450 ms ST segments: normal   1. Chest pain   2. Alcohol abuse       MDM  57 year old male with left-sided chest pain. Likely musculoskeletal. Patient has a large area of ecchymosis in area of his pain. Patient is unsure how he actually injured himself. He is an alcoholic though he does admit to frequent falls. Chest x-ray without evidence for fracture or pneumothorax. EKG is unremarkable. Very atypical for ACS. Plan symptomatic treatment. Offered have some talk to him in regard of his alcohol abuse, but is declining.         Raeford Razor,  MD 08/10/12 [phone]

## 2012-11-07 ENCOUNTER — Encounter: Payer: Self-pay | Admitting: *Deleted

## 2012-11-08 ENCOUNTER — Ambulatory Visit (INDEPENDENT_AMBULATORY_CARE_PROVIDER_SITE_OTHER): Payer: BC Managed Care – PPO | Admitting: Family Medicine

## 2012-11-08 ENCOUNTER — Encounter: Payer: Self-pay | Admitting: Family Medicine

## 2012-11-08 VITALS — BP 129/93 | HR 94 | Temp 98.1°F | Ht 70.0 in | Wt 182.2 lb

## 2012-11-08 DIAGNOSIS — R209 Unspecified disturbances of skin sensation: Secondary | ICD-10-CM

## 2012-11-08 DIAGNOSIS — G5621 Lesion of ulnar nerve, right upper limb: Secondary | ICD-10-CM

## 2012-11-08 DIAGNOSIS — M6740 Ganglion, unspecified site: Secondary | ICD-10-CM

## 2012-11-08 DIAGNOSIS — G5620 Lesion of ulnar nerve, unspecified upper limb: Secondary | ICD-10-CM

## 2012-11-08 DIAGNOSIS — I10 Essential (primary) hypertension: Secondary | ICD-10-CM

## 2012-11-08 DIAGNOSIS — L218 Other seborrheic dermatitis: Secondary | ICD-10-CM

## 2012-11-08 DIAGNOSIS — R200 Anesthesia of skin: Secondary | ICD-10-CM

## 2012-11-08 DIAGNOSIS — L219 Seborrheic dermatitis, unspecified: Secondary | ICD-10-CM | POA: Insufficient documentation

## 2012-11-08 HISTORY — DX: Lesion of ulnar nerve, right upper limb: G56.21

## 2012-11-08 MED ORDER — HYDROCHLOROTHIAZIDE 12.5 MG PO CAPS: 12.5000 mg | ORAL_CAPSULE | Freq: Every morning | ORAL | Status: DC

## 2012-11-08 NOTE — Assessment & Plan Note (Addendum)
Selsun blue to treat.   No evidence of sun damage or actinic keratosis.

## 2012-11-08 NOTE — Progress Notes (Signed)
Subjective:    Anthony [name] is a 57 y.o. male who presents to [facility] today for several issues:  1.  Right hand numbness:  New problem for patient.  Present for past several months.  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  Describes described sensation and tingling along 5th digit and most of 4th digit.  Otherwise hand WNL.  No pain.  No motor weakness. No inciting injury.  Has also noted a "bump" here that appeared last week.  Does not hurt, not red or inflamed.    2.  Dry skin on face:  Noted for past several months as well.  Has been using OTC cream which causes relief, but he didn't use this AM because he wanted me to see it.  Scaly patches in beard and scalp.    3.Hypertension:  Long-term problem for this patient.  No adverse effects from medication.  Donates plasma and checks BP regularly then, ranges from 130 - 150 systolic and persistently 90s diastolic.  No HA, CP, dizziness, shortness of breath, palpitations, or LE swelling.  Has actually been out of his medications since January.  BP controlled prior to that point, he reports.  Has been out of work also since January, increasing his stress levels.  BP Readings from Last 3 Encounters:  11/08/12 129/93  08/05/12 140/86  08/04/12 143/94     The following portions of the patient's history were reviewed and updated as appropriate: allergies, current medications, past medical history, family and social history, and problem list. Patient is a nonsmoker.    PMH reviewed.  Past Medical History  Diagnosis Date  . Hypertension    Past Surgical History  Procedure Laterality Date  . Hydrocele excision / repair  2010    Medications reviewed. Current Outpatient Prescriptions  Medication Sig Dispense Refill  . acetaminophen (TYLENOL) 325 MG tablet Take 650 mg by mouth at bedtime as needed. For pain      . aspirin EC 325 MG tablet Take 325 mg by mouth at bedtime as needed. For pain      . hydrochlorothiazide (MICROZIDE) 12.5 MG capsule  Take 12.5 mg by mouth every morning.       No current facility-administered medications for this visit.    ROS as above otherwise neg.  No chest pain, palpitations, SOB, Fever, Chills, Abd pain, N/V/D.   Objective:   Physical Exam BP 129/93  Pulse 94  Temp(Src) 98.1 F (36.7 C) (Oral)  Ht 5\' 10"  (1.778 m)  Wt 182 lb 3.2 oz (82.645 kg)  BMI 26.14 kg/m2 Gen:  Alert, cooperative patient who appears stated age in no acute distress.  Vital signs reviewed. HEENT: EOMI,  MMM Cardiac:  Regular rate and rhythm without murmur auscultated.  Good S1/S2. Pulm:  Clear to auscultation bilaterally with good air movement.  No wheezes or rales noted.   Abd:  Soft/nondistended/nontender.  Good bowel sounds throughout all four quadrants.  No masses noted.  Exts: RLE with +1 non pitting edema (from prior accidnet and surgery).  LLE without edema.   Hands:  Left hand WNL.  Right hand with nontender nodule noted palm of Right hand.  Just lateral to 4th digit tendon.  Does not move with tendon.  Sensation decreased along 5th and 4th digit of hand. No deformity to hand, no hypothenar atrophy.  Skin:  Red scaly patches noted at corner of moustache on Right side, along Left side of nose with easy scraping of skin, and throughout scalp, especially  at border with face.    No results found for this or any previous visit (from the past 72 hour(s)).

## 2012-11-08 NOTE — Assessment & Plan Note (Signed)
Most likely diagnosis for palmar lesion. Can be ultrasounded when at Sports [facility].  Not inflamed currently. If worsens or any evidence of inflammation, can discuss removal.

## 2012-11-08 NOTE — Assessment & Plan Note (Signed)
Persistently elevated but controlled when on his medications. Refill for HCTZ today.   FU in 3 months.

## 2012-11-08 NOTE — Assessment & Plan Note (Signed)
Sensory components.   No motor  Plan to refer to Sports med for ultrasonography of ulnar nerve and for any recommendations for ulnar entrapment, if this is what is going on.

## 2012-11-08 NOTE — Patient Instructions (Addendum)
It was good to see you today.    [facility] will be giving you a call by the end of the day or early tomorrow.  Use the Selsun blue on your face.  Refill for meds.

## 2012-11-18 ENCOUNTER — Ambulatory Visit (INDEPENDENT_AMBULATORY_CARE_PROVIDER_SITE_OTHER): Payer: BC Managed Care – PPO | Admitting: Family Medicine

## 2012-11-18 VITALS — BP 130/100 | Ht 70.0 in | Wt 180.0 lb

## 2012-11-18 DIAGNOSIS — M6530 Trigger finger, unspecified finger: Secondary | ICD-10-CM

## 2012-11-18 DIAGNOSIS — G5621 Lesion of ulnar nerve, right upper limb: Secondary | ICD-10-CM

## 2012-11-18 DIAGNOSIS — G5620 Lesion of ulnar nerve, unspecified upper limb: Secondary | ICD-10-CM

## 2012-11-18 HISTORY — DX: Trigger finger, unspecified finger: M65.30

## 2012-11-18 NOTE — Progress Notes (Signed)
  Subjective:    Patient ID: Anthony [name], male    DOB: January 12, 1956, 57 y.o.   MRN: [id number]  HPI 2 months of some numbness in his right hand. Came on fairly gradually, involves the ring and index finger and the ulnar portion of the palm up to the wrist. The entire forearm feels normal. The numbness is constant. Some tingling. No pain. #2. Also was noted by his primary care physician to have a nodule in the palm of the right hand. It is not really tender. He has no triggering or locking sensation of either the [place] finger or the ring finger.   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 He is right-hand dominant. He does not ride a motorcycle her bike and has no other specific injury noted his right upper extremity hand or wrist. He drinks alcohol fairly regularly. He is currently out of work.   Review of Systems See history of present illness. Additionally denies fever.    Objective:   Physical Exam  Vital signs are reviewed GENERAL: Well-developed male no acute distress IN: Right. Normal in appearance and symmetrical with the left hand. No thenar or hyperthenar atrophy. Full range of motion flexion extension lateral rotation at the wrist. Grip strength is normal. Abduction and adduction of the finger strength is normal. Pincer grasp is normal. Sensation to soft touch is mildly decreased in the lateral portion of the volar and dorsal hand as well as the fourth and fifth finger. Intact proprioception appeared intact sharp sense. ULTRASOUND: Nodular area noted on the flexor tendon of the fourth right finger. No triggering is noted to wrist reveals a fairly normal appearing carpal tunnel although the median nerve is mildly flattened. 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 Unclear if this will resolve.  His main concern was whether or not it would progress which I doubt. Potentially this could be a result of chronic repetitive trauma or single injury. There may be some component of alcoholic neuropathy but I doubt it. We discussed options which are limited. His main concerns have been answered and we will just follow this #2. Flexor tendon nodule. Nontender not causing triggering. We'll follow.

## 2013-05-02 ENCOUNTER — Ambulatory Visit: Payer: BC Managed Care – PPO | Admitting: Family Medicine

## 2013-05-05 ENCOUNTER — Ambulatory Visit (INDEPENDENT_AMBULATORY_CARE_PROVIDER_SITE_OTHER): Payer: BC Managed Care – PPO | Admitting: Family Medicine

## 2013-05-05 ENCOUNTER — Encounter: Payer: Self-pay | Admitting: Family Medicine

## 2013-05-05 VITALS — BP 156/97 | HR 137 | Temp 98.0°F | Ht 70.0 in | Wt 172.0 lb

## 2013-05-05 DIAGNOSIS — IMO0002 Reserved for concepts with insufficient information to code with codable children: Secondary | ICD-10-CM

## 2013-05-05 DIAGNOSIS — S32000S Wedge compression fracture of unspecified lumbar vertebra, sequela: Secondary | ICD-10-CM

## 2013-05-05 MED ORDER — NAPROXEN 500 MG PO TABS: 500.0000 mg | ORAL_TABLET | Freq: Two times a day (BID) | ORAL | Status: DC

## 2013-05-05 MED ORDER — OMEPRAZOLE 40 MG PO CPDR: 40.0000 mg | DELAYED_RELEASE_CAPSULE | Freq: Every day | ORAL | Status: DC

## 2013-05-05 NOTE — Patient Instructions (Signed)
It was nice to see you today.  Your pain should continue to improve over the next few weeks - month.  We will continue treating your pain.  I am switching you to Naprosyn twice daily.  You should also take scheduled tylenol (1000 mg three times daily).    Please follow up with your PCP.  If this continues to be a problem, you will need imaging and further workup.

## 2013-05-06 NOTE — Progress Notes (Signed)
Subjective:     Patient ID: Anthony [name], male   DOB: 1955-09-04, 57 y.o.   MRN: [id number]  HPI 57 year old male with HTN and alcohol abuse presents for evaluation of back pain.  1) Back Pain - Patient reports that on July 14th, 2014 he was in [place] visiting family.  He was helping his brother repair a chimney and slipped and fell off the roof (~25 feet).   - He landed on his feet and was taken to the hospital.  There, he was found to have L3, L4, and L5 compression fractures.  No surgery was done.  Patient was treated with medications and bracing and was discharged home 4-5 days later.   - He states that since that time he has had lower back pain.   - This has worsened recently.  He has been taking ibuprofen regularly for pain with some improvement/relief.  - He denies any radiation. No incontinence or LE weakness.  No recent fevers, chills.   Review of Systems Per HPI    Objective:   Physical Exam Exam: General: patient well appearing and in NAD.  Patient noted to be sweaty. Cardiovascular: Tachycardic.  Regular rhythm. No murmurs, rubs, or gallops. Respiratory: CTAB. No rales, rhonchi, or wheeze. Back: Decreased ROM with extension. No erythema noted.  Lumbar spine nontender to palpation. Neuro: No focal deficits.     Assessment:     See Problem list    Plan:

## 2013-05-07 DIAGNOSIS — M545 Low back pain: Secondary | ICD-10-CM | POA: Insufficient documentation

## 2013-05-07 DIAGNOSIS — M549 Dorsalgia, unspecified: Secondary | ICD-10-CM | POA: Insufficient documentation

## 2013-05-07 NOTE — Assessment & Plan Note (Signed)
Patient requesting "pain" medication. Patient given Naprosyn for pain.  Patient also encouraged to take scheduled tylenol.  PPI also given to help avoid gastritis from NSAIDS. Opoids not indicated given history, PE, and PMH (alcohol abuse).

## 2013-05-23 ENCOUNTER — Encounter: Payer: Self-pay | Admitting: Family Medicine

## 2013-05-23 ENCOUNTER — Ambulatory Visit (INDEPENDENT_AMBULATORY_CARE_PROVIDER_SITE_OTHER): Payer: BC Managed Care – PPO | Admitting: Family Medicine

## 2013-05-23 VITALS — BP 162/109 | HR 97 | Temp 97.7°F | Ht 70.0 in | Wt 179.3 lb

## 2013-05-23 DIAGNOSIS — F1010 Alcohol abuse, uncomplicated: Secondary | ICD-10-CM

## 2013-05-23 DIAGNOSIS — I10 Essential (primary) hypertension: Secondary | ICD-10-CM

## 2013-05-23 DIAGNOSIS — Z23 Encounter for immunization: Secondary | ICD-10-CM

## 2013-05-23 DIAGNOSIS — T148XXA Other injury of unspecified body region, initial encounter: Secondary | ICD-10-CM

## 2013-05-23 DIAGNOSIS — S32000A Wedge compression fracture of unspecified lumbar vertebra, initial encounter for closed fracture: Secondary | ICD-10-CM

## 2013-05-23 DIAGNOSIS — R000 Tachycardia, unspecified: Secondary | ICD-10-CM

## 2013-05-23 DIAGNOSIS — S32009A Unspecified fracture of unspecified lumbar vertebra, initial encounter for closed fracture: Secondary | ICD-10-CM

## 2013-05-23 DIAGNOSIS — IMO0002 Reserved for concepts with insufficient information to code with codable children: Secondary | ICD-10-CM

## 2013-05-23 LAB — COMPREHENSIVE METABOLIC PANEL
ALT: 66 U/L — ABNORMAL HIGH (ref 0–53)
AST: 93 U/L — ABNORMAL HIGH (ref 0–37)
CO2: 27 mEq/L (ref 19–32)
Calcium: 9.7 mg/dL (ref 8.4–10.5)
Chloride: 94 mEq/L — ABNORMAL LOW (ref 96–112)
Creat: 0.63 mg/dL (ref 0.50–1.35)
Sodium: 131 mEq/L — ABNORMAL LOW (ref 135–145)
Total Protein: 7.6 g/dL (ref 6.0–8.3)

## 2013-05-23 LAB — CBC WITH DIFFERENTIAL/PLATELET
Basophils Absolute: 0.1 10*3/uL (ref 0.0–0.1)
Basophils Relative: 2 % — ABNORMAL HIGH (ref 0–1)
Eosinophils Relative: 2 % (ref 0–5)
HCT: 38.3 % — ABNORMAL LOW (ref 39.0–52.0)
MCHC: 35.5 g/dL (ref 30.0–36.0)
MCV: 97.5 fL (ref 78.0–100.0)
Monocytes Absolute: 0.7 10*3/uL (ref 0.1–1.0)
Neutro Abs: 3.5 10*3/uL (ref 1.7–7.7)
Platelets: 435 10*3/uL — ABNORMAL HIGH (ref 150–400)
RDW: 14.4 % (ref 11.5–15.5)

## 2013-05-23 MED ORDER — METOPROLOL SUCCINATE ER 25 MG PO TB24: 25.0000 mg | ORAL_TABLET | Freq: Every day | ORAL | Status: DC

## 2013-05-23 MED ORDER — TRAMADOL HCL 50 MG PO TABS: 50.0000 mg | ORAL_TABLET | Freq: Three times a day (TID) | ORAL | Status: DC | PRN

## 2013-05-23 MED ORDER — HYDROCHLOROTHIAZIDE 25 MG PO TABS: 25.0000 mg | ORAL_TABLET | Freq: Every day | ORAL | Status: DC

## 2013-05-23 MED ORDER — CYCLOBENZAPRINE HCL 5 MG PO TABS: 5.0000 mg | ORAL_TABLET | Freq: Two times a day (BID) | ORAL | Status: DC | PRN

## 2013-05-23 NOTE — Patient Instructions (Signed)
I will talk to Anthony [name] our Child psychotherapist.   Increased the HCTZ 25 mg.  Take the Metoprolol as an addition and for your pulse.    Tramadol and Flexeril for pain relief.    It was good to see you today.  Come back and see me around Thanksgiving.

## 2013-05-24 ENCOUNTER — Ambulatory Visit
Admission: RE | Admit: 2013-05-24 | Discharge: 2013-05-24 | Disposition: A | Discharge status: Home / Self Care | Payer: BC Managed Care – PPO | Source: Ambulatory Visit | Attending: Family Medicine | Admitting: Family Medicine

## 2013-05-24 DIAGNOSIS — IMO0002 Reserved for concepts with insufficient information to code with codable children: Secondary | ICD-10-CM

## 2013-05-24 DIAGNOSIS — R000 Tachycardia, unspecified: Secondary | ICD-10-CM | POA: Insufficient documentation

## 2013-05-24 NOTE — Progress Notes (Signed)
Subjective:    Anthony [name] is a 57 y.o. male who presents to [facility] today for several concerns:  1.  Back pain:  Larey Seat off roof in July.  See previous OV note for full details.  Noted to have compression fracture L3-4-5 and treated as inpatient in [place] at that time.  Initially prescribed with Tramadol for pain relief but only for about 3 weeks.  Has been alternating Tylenol with Ibuprofen 800 mg 3-5 times a day since then.  Some relief with pain from these, had flexeril which helped with pain relief as well.  Doing well despite fractures.  Working out, doing abdominal exercises, no limping, no LE weakness, no paresthesias.  Only complaint is pain in back.  No bladder/bowel incontinence  2.  Nausea:  Has been taking increased doses of Ibuprofen 800 mg 3-4 times a day since his fall as above.  Very infrequent vomiting, none for about 3 weeks.  Not eating regularly, but this is due to being homeless and not nausea.  He is hungry.    3.  Hypertension:  Long-term problem for this patient.  No adverse effects from medication.  Not checking it regularly.  No HA, CP, dizziness, shortness of breath, palpitations, or LE swelling.  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  No chest pain.  Occasional palpitations.  No dyspnea.   BP Readings from Last 3 Encounters:  05/23/13 162/109  05/05/13 156/97  11/18/12 130/100   4.  Social situation:  Now homeless.  Living in sleeping bag and sleeping outside in cool to cold weather.  Draws some social service money and maintains gym membership mainly for ability to shower daily.  Not regular meals due to finances.    Prev health:   Currently overdue for flu, tetanus, colonoscopy. .  The following portions of the patient's history were reviewed and updated as appropriate: allergies, current medications, past medical history, family and social history, and problem list. Patient  is a nonsmoker.    PMH reviewed.  Past Medical History  Diagnosis Date  . Hypertension    Past Surgical History  Procedure Laterality Date  . Hydrocele excision / repair  2010    Medications reviewed. Current Outpatient Prescriptions  Medication Sig Dispense Refill  . acetaminophen (TYLENOL) 325 MG tablet Take 650 mg by mouth at bedtime as needed. For pain      . aspirin EC 325 MG tablet Take 325 mg by mouth at bedtime as needed. For pain      . hydrochlorothiazide (MICROZIDE) 12.5 MG capsule Take 1 capsule (12.5 mg total) by mouth every morning.  30 capsule  6  . naproxen (NAPROSYN) 500 MG tablet Take 1 tablet (500 mg total) by mouth 2 (two) times daily with a meal.  60 tablet  0  . omeprazole (PRILOSEC) 40 MG capsule Take 1 capsule (40 mg total) by mouth daily.  30 capsule  3   No current facility-administered medications for this visit.    ROS as above otherwise neg.    Objective:   Physical Exam BP 162/109  Pulse 97  Temp(Src) 97.7 F (36.5 C) (Oral)  Ht 5\' 10"  (1.778 m)  Wt 179 lb 4.8 oz (81.33 kg)  BMI 25.73 kg/m2 Gen:  Alert, cooperative patient who appears stated age in no acute distress.  Vital signs reviewed. HEENT: EOMI,  MMM Cardiac:  Regular rhythm, tachycardic.  No murmur Pulm:  Clear to auscultation bilaterally with good air movement.  No wheezes or rales noted.   Abd:  Soft/nondistended/nontender. Back:  Forward flexion about 40 degrees.limited by pain.  Apley's test negative.  SLR positive for back pain BL.  TTP along L4/L5 minimally.  No BL lumbar pain.  Stork's test positive.   Exts: Non edematous BL  LE, warm and well perfused.  Neuro:  Faint resting tremor noted BL hands.  No decreased sensation/strength  No results found for this or any previous visit (from the past 72 hour(s)).

## 2013-05-24 NOTE — Assessment & Plan Note (Signed)
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  Present for almost a year.   No red flags or concerning findings.

## 2013-05-24 NOTE — Assessment & Plan Note (Signed)
Tramadol and Flexeril for relief.   Checking for any signs of kidney damage from NSAID usage long-term.  Told to stop these. Needs repeat x-ray to see how he's doing.

## 2013-05-24 NOTE — Assessment & Plan Note (Signed)
Increase HCTZ and add Metoprolol for pulse control as well See tachycardia below.

## 2013-05-24 NOTE — Assessment & Plan Note (Signed)
CAGE positive for cutting back. Tremor noted at rest. Referred to social worker today -- homelessness is bigger issue but will continue to address at each visit and encourage complete cessation.

## 2013-05-29 ENCOUNTER — Telehealth: Payer: Self-pay | Admitting: Family Medicine

## 2013-05-29 NOTE — Telephone Encounter (Signed)
Called and discussed labwork with patient.  Somewhat elevated LFTs, likely secondary to EtOHism.  Kidney function WNL, which is what I was concerned about with NSAID use.  Recommended again to cut back on alcohol.  To FU for BP recheck in 1-2 weeks.  Patient expressed understanding and appreciation for call.

## 2013-06-05 ENCOUNTER — Telehealth: Payer: Self-pay | Admitting: Clinical

## 2013-06-05 NOTE — Telephone Encounter (Signed)
Clinical Child psychotherapist (CSW) received a referral to provide resources for housing. CSW was unable to reach pt or leave a vm. CSW will try again at a later time.  Theresia Bough, MSW, LCSW [phone]

## 2013-06-08 ENCOUNTER — Ambulatory Visit (INDEPENDENT_AMBULATORY_CARE_PROVIDER_SITE_OTHER): Payer: BC Managed Care – PPO | Admitting: Family Medicine

## 2013-06-08 ENCOUNTER — Encounter: Payer: Self-pay | Admitting: Family Medicine

## 2013-06-08 ENCOUNTER — Telehealth: Payer: Self-pay | Admitting: Clinical

## 2013-06-08 VITALS — BP 123/83 | HR 108 | Temp 98.1°F | Ht 70.0 in | Wt 179.8 lb

## 2013-06-08 DIAGNOSIS — Z609 Problem related to social environment, unspecified: Secondary | ICD-10-CM

## 2013-06-08 DIAGNOSIS — R000 Tachycardia, unspecified: Secondary | ICD-10-CM

## 2013-06-08 DIAGNOSIS — IMO0002 Reserved for concepts with insufficient information to code with codable children: Secondary | ICD-10-CM

## 2013-06-08 DIAGNOSIS — B360 Pityriasis versicolor: Secondary | ICD-10-CM

## 2013-06-08 DIAGNOSIS — Z659 Problem related to unspecified psychosocial circumstances: Secondary | ICD-10-CM

## 2013-06-08 DIAGNOSIS — I10 Essential (primary) hypertension: Secondary | ICD-10-CM

## 2013-06-08 DIAGNOSIS — Z658 Other specified problems related to psychosocial circumstances: Secondary | ICD-10-CM

## 2013-06-08 DIAGNOSIS — S32000S Wedge compression fracture of unspecified lumbar vertebra, sequela: Secondary | ICD-10-CM

## 2013-06-08 HISTORY — DX: Pityriasis versicolor: B36.0

## 2013-06-08 MED ORDER — KETOCONAZOLE 2 % EX CREA: 1.0000 "application " | TOPICAL_CREAM | Freq: Every day | CUTANEOUS | Status: DC

## 2013-06-08 NOTE — Patient Instructions (Addendum)
The rash is called tinea versicolor.  Use the ketoconazole cream daily for 2 weeks.  Call for the colonoscopy.    Take the Tramadol and Flexeril only if needed for your pain relief.  Otherwise use the Tylenol for pain relief.  Keep doing the core/abdominal exercises for strengthening.    [facility] is the name of who to call for hair donation.    See me in late December or January so I know you're doing okay.  Come back sooner if you need to.

## 2013-06-08 NOTE — Assessment & Plan Note (Signed)
Improving.   Can be controlled with rest when at plasma donation site.

## 2013-06-08 NOTE — Assessment & Plan Note (Signed)
Tylenol for pain relief.   Tramadol if needed along with Flexeril. To continue abdominal core strengthening exercises.  Repeat CXR about same as radiographs from outside facility.

## 2013-06-08 NOTE — Assessment & Plan Note (Addendum)
At goal.   No med changes today.

## 2013-06-08 NOTE — Telephone Encounter (Signed)
Clinical Child psychotherapist (CSW) contacted pt to provide housing resources. CSW explored pt living situation and whether pt has any family/other support/housing options. Pt stated he is currently staying in his storage unit or with a friend. Pt states he has signed up for the [facility] which will be available to him from Dec 1 - April 1. Pt also stated he has not used his days at [facility] and therefore has the option of staying there. Pt stated he does not have any family locally however if he runs out of places to stay he can always move back to [place] where his family lives. CSW explored whether pt has blankets/a coat and is getting food. 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 CSW explored whether pt has looked for employment through job [employer]. Pt stated he is waiting for his birth certificate to arrive so he can apply for disability however feels certain that he will not qualify. Pt also states that he is unable to work at this moment due to his back injury. CSW provided active listening and encouraged pt to contact CSW if needs arise in the future. At this moment pt appears to have his housing situated, is eating daily and does not express any concerns. Pt appreciative of CSW call.  Theresia Bough, MSW, LCSW [phone]

## 2013-06-08 NOTE — Assessment & Plan Note (Signed)
Ketoconazole cream to treat.

## 2013-06-08 NOTE — Progress Notes (Signed)
Subjective:    Anthony [name] is a 57 y.o. male who presents to [facility] today for several concerns:  1.  FU for compression fx:  Took Tramadol on Monday.  Stopped b/c plasma center won't let him donate if he's taking this daily.  Has been taking Tylenol since then for relief, taking 2000 mg a day.  Says this makes pain manageable.  Mostly aching in lower back.  No sharp shooting pain.  Continuing core exercises at the gym.  2.  Cards Concerns:  Donating plasma twice a week.  Tachycardia still precludes him from donating at times, however usually he can rest and bring it down to normal levels.  Blood pressure has greatly improved since addition of Metoprolol.  No chest pain/diaphoresis/palpitations.  3.  Blood in stool: Never had colonoscopy.  Has noted some few drops of blood when wiping.  No blood in stool/toilet.  No rectal pain.  Some rectal itching.  No abdominal pain.   4.  Rash: Present for past several months.  Redness spreading in middle of his chest.  Does not itch.  Has not tried anything for relief.  No fevers/chills/recent illnesses.     Prev health:  Currently overdue for colonoscopy.  The following portions of the patient's history were reviewed and updated as appropriate: allergies, current medications, past medical history, family and social history, and problem list. Patient is a nonsmoker.    PMH reviewed.  Past Medical History  Diagnosis Date  . Hypertension    Past Surgical History  Procedure Laterality Date  . Hydrocele excision / repair  2010    Medications reviewed. Current Outpatient Prescriptions  Medication Sig Dispense Refill  . acetaminophen (TYLENOL) 325 MG tablet Take 650 mg by mouth at bedtime as needed. For pain      . aspirin EC 325 MG tablet Take 325 mg by mouth at bedtime as needed. For pain      . cyclobenzaprine (FLEXERIL) 5 MG tablet Take 1 tablet (5 mg total) by mouth 2 (two) times daily as needed for muscle spasms.  30 tablet  1  .  hydrochlorothiazide (HYDRODIURIL) 25 MG tablet Take 1 tablet (25 mg total) by mouth daily.  90 tablet  3  . metoprolol succinate (TOPROL-XL) 25 MG 24 hr tablet Take 1 tablet (25 mg total) by mouth daily.  90 tablet  3  . naproxen (NAPROSYN) 500 MG tablet Take 1 tablet (500 mg total) by mouth 2 (two) times daily with a meal.  60 tablet  0  . omeprazole (PRILOSEC) 40 MG capsule Take 1 capsule (40 mg total) by mouth daily.  30 capsule  3  . traMADol (ULTRAM) 50 MG tablet Take 1 tablet (50 mg total) by mouth every 8 (eight) hours as needed.  45 tablet  1   No current facility-administered medications for this visit.    ROS as above otherwise neg.  No chest pain, palpitations, SOB, Fever, Chills, Abd pain, N/V/D.   Objective:   Physical Exam BP 123/83  Pulse 108  Temp(Src) 98.1 F (36.7 C) (Oral)  Ht 5\' 10"  (1.778 m)  Wt 179 lb 12.8 oz (81.557 kg)  BMI 25.80 kg/m2 Gen:  Alert, cooperative patient who appears stated age in no acute distress.  Vital signs reviewed. HEENT: EOMI,  MMM.  Poor dental hygiene. Cardiac:  Tachycardic but regular rhythm. No murmur Pulm:  Clear to auscultation BL  Abd:  Soft/nondistended/nontender.   Rectal: External hemorrhoid noted, not thrombosed/nontender.  Good rectal tone.  No overt bleeding noted.  Exts: Non edematous BL LE's Skin:  Red macular rash noted midsternum.   Neuro:  Fine resting tremor.  No focal deficits  No results found for this or any previous visit (from the past 72 hour(s)).

## 2013-06-09 ENCOUNTER — Encounter: Payer: Self-pay | Admitting: Gastroenterology

## 2013-06-09 DIAGNOSIS — Z659 Problem related to unspecified psychosocial circumstances: Secondary | ICD-10-CM | POA: Insufficient documentation

## 2013-06-09 NOTE — Assessment & Plan Note (Signed)
Patient has place to sleep every night.  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 Has plans in place for rest of winter as well.   Social worker still planning on touching base with him.

## 2013-06-22 ENCOUNTER — Telehealth: Payer: Self-pay | Admitting: Family Medicine

## 2013-06-22 NOTE — Telephone Encounter (Signed)
Patient states that BP has been good but his heart rate is still very high. Patient thinks that it could possibly be anxiety? Please call patient

## 2013-06-29 ENCOUNTER — Ambulatory Visit: Payer: BC Managed Care – PPO | Admitting: Family Medicine

## 2013-07-05 ENCOUNTER — Encounter: Payer: Self-pay | Admitting: Gastroenterology

## 2013-07-05 ENCOUNTER — Ambulatory Visit (INDEPENDENT_AMBULATORY_CARE_PROVIDER_SITE_OTHER): Payer: BC Managed Care – PPO | Admitting: Gastroenterology

## 2013-07-05 VITALS — BP 130/80 | HR 92 | Ht 70.0 in | Wt 186.0 lb

## 2013-07-05 DIAGNOSIS — K921 Melena: Secondary | ICD-10-CM

## 2013-07-05 MED ORDER — PEG-KCL-NACL-NASULF-NA ASC-C 100 G PO SOLR: 1.0000 | Freq: Once | ORAL | Status: DC

## 2013-07-05 NOTE — Patient Instructions (Signed)
You have been scheduled for a colonoscopy with propofol. Please follow written instructions given to you at your visit today.  Please pick up your prep kit at the pharmacy within the next 1-3 days. If you use inhalers (even only as needed), please bring them with you on the day of your procedure.  Thank you for choosing me and [facility].  Malcolm T. Stark, Jr., MD., FACG    

## 2013-07-05 NOTE — Progress Notes (Signed)
    History of Present Illness: This is a 57 year old male who notes intermittent small amounts of bright red blood on the tissue paper for about the past year. He was evaluated by his primary physician and external hemorrhoids were noted. Denies weight loss, abdominal pain, constipation, diarrhea, change in stool caliber, melena, nausea, vomiting, dysphagia, reflux symptoms, chest pain.  Review of Systems: Pertinent positive and negative review of systems were noted in the above HPI section. All other review of systems were otherwise negative.  Current Medications, Allergies, Past Medical History, Past Surgical History, Family History and Social History were reviewed in [employer] record.  Physical Exam: General: Well developed , well nourished, no acute distress Head: Normocephalic and atraumatic Eyes:  sclerae anicteric, EOMI Ears: Normal auditory acuity Mouth: No deformity or lesions Neck: Supple, no masses or thyromegaly Lungs: Clear throughout to auscultation Heart: Regular rate and rhythm; no murmurs, rubs or bruits Abdomen: Soft, non tender and non distended. No masses, hepatosplenomegaly or hernias noted. Normal Bowel sounds Rectal: Deferred to colonoscopy, recent exam by his PCP showed an external hemorrhoid and no other abnormalities Musculoskeletal: Symmetrical with no gross deformities  Skin: No lesions on visible extremities Pulses:  Normal pulses noted Extremities: No clubbing, cyanosis, edema or deformities noted Neurological: Alert oriented x 4, grossly nonfocal Cervical Nodes:  No significant cervical adenopathy Inguinal Nodes: No significant inguinal adenopathy Psychological:  Alert and cooperative. Normal mood and affect  Assessment and Recommendations:  1. Hematochezia. External hemorrhoid. Suspect hemorrhoids as a source of bleeding however colorectal neoplasms should be definitively excluded and he has not previously had a screening  colonoscopy. The risks, benefits, and alternatives to colonoscopy with possible biopsy and possible polypectomy were discussed with the patient and they consent to proceed.

## 2013-07-06 ENCOUNTER — Ambulatory Visit (INDEPENDENT_AMBULATORY_CARE_PROVIDER_SITE_OTHER): Payer: BC Managed Care – PPO | Admitting: Family Medicine

## 2013-07-06 ENCOUNTER — Encounter: Payer: Self-pay | Admitting: Family Medicine

## 2013-07-06 ENCOUNTER — Encounter: Payer: Self-pay | Admitting: Gastroenterology

## 2013-07-06 VITALS — BP 139/89 | HR 105 | Temp 98.0°F | Wt 185.3 lb

## 2013-07-06 DIAGNOSIS — Z658 Other specified problems related to psychosocial circumstances: Secondary | ICD-10-CM

## 2013-07-06 DIAGNOSIS — Z659 Problem related to unspecified psychosocial circumstances: Secondary | ICD-10-CM

## 2013-07-06 DIAGNOSIS — M79609 Pain in unspecified limb: Secondary | ICD-10-CM

## 2013-07-06 DIAGNOSIS — M79674 Pain in right toe(s): Secondary | ICD-10-CM

## 2013-07-06 DIAGNOSIS — R000 Tachycardia, unspecified: Secondary | ICD-10-CM

## 2013-07-06 MED ORDER — LORAZEPAM 0.5 MG PO TABS: 0.5000 mg | ORAL_TABLET | Freq: Every evening | ORAL | Status: DC | PRN

## 2013-07-06 NOTE — Patient Instructions (Signed)
Try the Lorazepam before going to the [facility].  It was good to see you today.  Keep working out like you have been.    I'll see you back in about 3 months.

## 2013-07-06 NOTE — Progress Notes (Signed)
Subjective:    Anthony [name] is a 57 y.o. male who presents to [facility] today for follow-up of several issues:  1.  Tachycardia:  Persists.  No chest pain.  Precludes him from donating plasma.  NO dyspnea.  No actual palpitations.  Still drinking though trying to cut back.   2.  Right small toe pain:  Toe curls under other toes.  Walks on this constantly.  Painful.  No redness or swelling, has tried nothing for relief.    3.  Back pain:  Improving.  Still some occasional sharp pains that occur intermittently.  Working out daily to improve core strength and stamina.  Still occasionally aching at times.  No incontinence, fevers/ chills.      The following portions of the patient's history were reviewed and updated as appropriate: allergies, current medications, past medical history, family and social history, and problem list. Patient is a nonsmoker.    PMH reviewed.  Past Medical History  Diagnosis Date  . Hypertension   . Hydrocele   . Lumbar compression fracture    Past Surgical History  Procedure Laterality Date  . Hydrocele excision / repair  2010    Medications reviewed. Current Outpatient Prescriptions  Medication Sig Dispense Refill  . acetaminophen (TYLENOL) 325 MG tablet Take 650 mg by mouth daily. For pain      . aspirin EC 325 MG tablet Take 325 mg by mouth at bedtime as needed. For pain      . cyclobenzaprine (FLEXERIL) 5 MG tablet Take 1 tablet (5 mg total) by mouth 2 (two) times daily as needed for muscle spasms.  30 tablet  1  . hydrochlorothiazide (HYDRODIURIL) 25 MG tablet Take 1 tablet (25 mg total) by mouth daily.  90 tablet  3  . ketoconazole (NIZORAL) 2 % cream Apply 1 application topically daily. X 14 days  30 g  1  . metoprolol succinate (TOPROL-XL) 25 MG 24 hr tablet Take 1 tablet (25 mg total) by mouth daily.  90 tablet  3  . naproxen (NAPROSYN) 500 MG tablet Take 1 tablet (500 mg total) by mouth 2 (two) times daily with a meal.  60 tablet  0  . omeprazole  (PRILOSEC) 40 MG capsule Take 1 capsule (40 mg total) by mouth daily.  30 capsule  3  . peg 3350 powder (MOVIPREP) 100 G SOLR Take 1 kit (200 g total) by mouth once.  1 kit  0  . traMADol (ULTRAM) 50 MG tablet Take 1 tablet (50 mg total) by mouth every 8 (eight) hours as needed.  45 tablet  1   No current facility-administered medications for this visit.    ROS as above otherwise neg.  No chest pain, palpitations, SOB, Fever, Chills, Abd pain, N/V/D.   Objective:   Physical Exam BP 139/89  Pulse 105  Temp(Src) 98 F (36.7 C) (Oral)  Wt 185 lb 4.8 oz (84.052 kg) Gen:  Alert, cooperative patient who appears stated age in no acute distress.  Vital signs reviewed. HEENT: EOMI,  MMM Cardiac:  Regular rate and rhythm without murmur auscultated.   Pulm:  Clear to auscultation bilaterally with good air movement.  No wheezes or rales noted.   Back:  TTP along lumbar spine.  No spasm noted.  Exts: Non edematous BL  LE, warm and well perfused.   Right 5th digit curls under 4th digit.  Has non-painful callus on dorsum.   Neuro:  Sensation 5/5 BL feet and toes to touch  No results found for this or any previous visit (from the past 72 hour(s)).

## 2013-07-07 DIAGNOSIS — M79676 Pain in unspecified toe(s): Secondary | ICD-10-CM | POA: Insufficient documentation

## 2013-07-07 NOTE — Assessment & Plan Note (Signed)
Living in shelter, situated for coming winter

## 2013-07-07 NOTE — Assessment & Plan Note (Signed)
No refills needed today. Still with some pain.

## 2013-07-07 NOTE — Assessment & Plan Note (Signed)
Unclear what to do to help so he doesn't mash toe each time he walks Refer to podiatry for rec's

## 2013-07-07 NOTE — Assessment & Plan Note (Signed)
Persists. Brother has similar issue, non-drinker.  Relief with Ativan once daily. Will attempt this, to take on days he has plasma donation.  Discussed possibility of addiction to this medication as well as resp depression.  Low dose only at night and not when drinking.  Patient expressed understanding Possibly may help with decrease in drinking as well.

## 2013-08-01 ENCOUNTER — Telehealth: Payer: Self-pay | Admitting: *Deleted

## 2013-08-01 NOTE — Telephone Encounter (Signed)
Patient comes in today stating that the Lorazepam prescribed at last office visit doesn't work for him. He states his brother has been on Citalopram with more success and wonders if Dr. Mingo Amber thinks this would be a good choice for him instead of the Lorazepam. Patient would like any rx's sent to [facility]. Will forward to PCP

## 2013-08-02 NOTE — Telephone Encounter (Signed)
Pt called to see if Dr. Mingo Amber has decided to change his medication? Please call and let him know. jw

## 2013-08-02 NOTE — Telephone Encounter (Signed)
Spoke with patient.  Too hard to figure out over the phone.  Told him to call and come see me tomorrow afternoon.  Will check EKG as well for persistent tachycardia.    Message for front office staff -- it's okay to double-book tomorrow PM.

## 2013-08-10 ENCOUNTER — Ambulatory Visit (HOSPITAL_COMMUNITY)
Admission: RE | Admit: 2013-08-10 | Discharge: 2013-08-10 | Disposition: A | Discharge status: Home / Self Care | Payer: BC Managed Care – PPO | Source: Ambulatory Visit | Attending: Family Medicine | Admitting: Family Medicine

## 2013-08-10 ENCOUNTER — Encounter: Payer: Self-pay | Admitting: Family Medicine

## 2013-08-10 ENCOUNTER — Ambulatory Visit (INDEPENDENT_AMBULATORY_CARE_PROVIDER_SITE_OTHER): Payer: BC Managed Care – PPO | Admitting: Family Medicine

## 2013-08-10 VITALS — BP 133/91 | HR 105 | Temp 98.1°F | Wt 183.0 lb

## 2013-08-10 DIAGNOSIS — Z659 Problem related to unspecified psychosocial circumstances: Secondary | ICD-10-CM

## 2013-08-10 DIAGNOSIS — R000 Tachycardia, unspecified: Secondary | ICD-10-CM | POA: Insufficient documentation

## 2013-08-10 DIAGNOSIS — S32000A Wedge compression fracture of unspecified lumbar vertebra, initial encounter for closed fracture: Secondary | ICD-10-CM

## 2013-08-10 DIAGNOSIS — Z658 Other specified problems related to psychosocial circumstances: Secondary | ICD-10-CM

## 2013-08-10 DIAGNOSIS — I10 Essential (primary) hypertension: Secondary | ICD-10-CM

## 2013-08-10 DIAGNOSIS — F1010 Alcohol abuse, uncomplicated: Secondary | ICD-10-CM

## 2013-08-10 DIAGNOSIS — S32009A Unspecified fracture of unspecified lumbar vertebra, initial encounter for closed fracture: Secondary | ICD-10-CM

## 2013-08-10 LAB — COMPREHENSIVE METABOLIC PANEL
ALBUMIN: 4 g/dL (ref 3.5–5.2)
ALT: 55 U/L — ABNORMAL HIGH (ref 0–53)
AST: 75 U/L — ABNORMAL HIGH (ref 0–37)
Alkaline Phosphatase: 76 U/L (ref 39–117)
BUN: 13 mg/dL (ref 6–23)
CALCIUM: 8.9 mg/dL (ref 8.4–10.5)
CHLORIDE: 102 meq/L (ref 96–112)
CO2: 24 meq/L (ref 19–32)
CREATININE: 0.78 mg/dL (ref 0.50–1.35)
GLUCOSE: 82 mg/dL (ref 70–99)
POTASSIUM: 4.5 meq/L (ref 3.5–5.3)
Sodium: 139 mEq/L (ref 135–145)
Total Bilirubin: 0.6 mg/dL (ref 0.3–1.2)
Total Protein: 7.1 g/dL (ref 6.0–8.3)

## 2013-08-10 LAB — CBC
HEMATOCRIT: 39.8 % (ref 39.0–52.0)
HEMOGLOBIN: 13.7 g/dL (ref 13.0–17.0)
MCH: 33.7 pg (ref 26.0–34.0)
MCHC: 34.4 g/dL (ref 30.0–36.0)
MCV: 97.8 fL (ref 78.0–100.0)
Platelets: 249 10*3/uL (ref 150–400)
RBC: 4.07 MIL/uL — AB (ref 4.22–5.81)
RDW: 13 % (ref 11.5–15.5)
WBC: 4.7 10*3/uL (ref 4.0–10.5)

## 2013-08-10 MED ORDER — CITALOPRAM HYDROBROMIDE 20 MG PO TABS: 20.0000 mg | ORAL_TABLET | Freq: Every day | ORAL | Status: DC

## 2013-08-10 NOTE — Patient Instructions (Signed)
Try the treatment centers I have circled.  Call and let me know if they're not working out.    I have sent in the Citalopram for you.  Let me know how this does.    We are checking labs today.  It will tell me more about the trouble eating and if you've had any issues from the alcohol.    See me in 2 weeks.

## 2013-08-10 NOTE — Progress Notes (Signed)
Subjective:    Anthony [name] is a 58 y.o. male who presents to [facility] today for several issues:  1.  Tachycardia:  Persists.  No chest pain.  No dyspnea.  No palpitations.  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    2.  Alcohol abuse:  Admits not "wanting" to quit, but knows he needs to.  Not interested in AA b/c of focus on a higher power.  Has quit previously cold [place] in 2001 after multiple DUI's, but needs help now.  Drinking for years.  No bleeding/bruising.  No LE edema.   3.  Difficulty eating and sleeping:  States present since July, though on further questioning sleep difficulties present since he was a child.  Has noted decreased appetitite since hurt his back.  Irregular eating schedule.  Protein shake every few days.  Drinks 12-24 beers to a 5th of whiskey basically daily.  Weight has remained stable.    ROS as above per HPI, otherwise neg.    The following portions of the patient's history were reviewed and updated as appropriate: allergies, current medications, past medical history, family and social history, and problem list. Patient is a nonsmoker.    PMH reviewed.  Past Medical History  Diagnosis Date  . Hypertension   . Hydrocele   . Lumbar compression fracture    Past Surgical History  Procedure Laterality Date  . Hydrocele excision / repair  2010    Medications reviewed. Current Outpatient Prescriptions  Medication Sig Dispense Refill  . acetaminophen (TYLENOL) 325 MG tablet Take 650 mg by mouth daily. For pain      . aspirin EC 325 MG tablet Take 325 mg by mouth at bedtime as needed. For pain      . cyclobenzaprine (FLEXERIL) 5 MG tablet Take 1 tablet (5 mg total) by mouth 2 (two) times daily as needed for muscle spasms.  30 tablet  1  . hydrochlorothiazide (HYDRODIURIL) 25 MG tablet Take 1 tablet (25 mg total) by mouth daily.  90 tablet  3  . ketoconazole (NIZORAL) 2 % cream Apply 1 application topically daily. X 14 days  30 g  1  . LORazepam (ATIVAN) 0.5 MG tablet Take 1  tablet (0.5 mg total) by mouth at bedtime as needed for anxiety.  30 tablet  1  . metoprolol succinate (TOPROL-XL) 25 MG 24 hr tablet Take 1 tablet (25 mg total) by mouth daily.  90 tablet  3  . naproxen (NAPROSYN) 500 MG tablet Take 1 tablet (500 mg total) by mouth 2 (two) times daily with a meal.  60 tablet  0  . omeprazole (PRILOSEC) 40 MG capsule Take 1 capsule (40 mg total) by mouth daily.  30 capsule  3  . peg 3350 powder (MOVIPREP) 100 G SOLR Take 1 kit (200 g total) by mouth once.  1 kit  0  . traMADol (ULTRAM) 50 MG tablet Take 1 tablet (50 mg total) by mouth every 8 (eight) hours as needed.  45 tablet  1   No current facility-administered medications for this visit.     Objective:   Physical Exam There were no vitals taken for this visit. Gen:  Alert, cooperative patient who appears stated age in no acute distress.  Vital signs reviewed.  Somewhat more disheveled and fatigued appearing today than usual. HEENT: EOMI,  MMM Cardiac:  RRR, pulse 96 on my count Pulm:  Clear to auscultation bilaterally  Abd:  Soft/nondistended/nontender.  No organomegaly, unable to palpate liver edge Exts: Non edematous  BL  LE, warm and well perfused.  Skin:  No bruising or varicosities noted  No results found for this or any previous visit (from the past 72 hour(s)).

## 2013-08-11 LAB — HEPATITIS PANEL, ACUTE
HCV AB: NEGATIVE
HEP A IGM: NONREACTIVE
HEP B C IGM: NONREACTIVE
HEP B S AG: NEGATIVE

## 2013-08-11 NOTE — Assessment & Plan Note (Addendum)
Know has to pay deductible for insurance.  Contributes to EtOHism Contributing to eating and drinking difficulties -- checking labs today

## 2013-08-11 NOTE — Assessment & Plan Note (Signed)
Still some mild pain Controlled with Ibuprofen/Tramadol.

## 2013-08-11 NOTE — Assessment & Plan Note (Signed)
Long discussion with him today. Wants to talk with social worker, out on maternity leave Provided handout on resources.  Will call [facility]

## 2013-08-11 NOTE — Assessment & Plan Note (Addendum)
Better. Continue metoprolol Baseline EKG today -- NSR Evidently brother with same issue and improved with Celexa. Stopping Ativan and trying Celexa  FU in 2 weeks.

## 2013-08-16 ENCOUNTER — Telehealth: Payer: Self-pay | Admitting: Family Medicine

## 2013-08-16 NOTE — Telephone Encounter (Signed)
Pt is calling because Dr. Mingo Amber told him that if his medication is over 4.00 call back and he would tell him where to get it at 4.00. Please send to a place that it is 4.00 and call and let him know. jw

## 2013-08-18 NOTE — Telephone Encounter (Signed)
Please let patient know that this medicine is listed as $4 on [facility] list.  He'll need to go to [facility] -- which one should I send it to?

## 2013-08-21 NOTE — Telephone Encounter (Signed)
Pt chose the [facility] on [address]

## 2013-08-22 ENCOUNTER — Telehealth: Payer: Self-pay | Admitting: Gastroenterology

## 2013-08-22 MED ORDER — PEG-KCL-NACL-NASULF-NA ASC-C 100 G PO SOLR: 1.0000 | Freq: Once | ORAL | Status: DC

## 2013-08-22 NOTE — Telephone Encounter (Signed)
Patient will come and pick up instructions and rx sent to the pharmacy

## 2013-08-28 ENCOUNTER — Telehealth: Payer: Self-pay | Admitting: Gastroenterology

## 2013-08-28 NOTE — Telephone Encounter (Signed)
Yes charge 

## 2013-08-29 ENCOUNTER — Encounter: Payer: BC Managed Care – PPO | Admitting: Gastroenterology

## 2013-09-20 ENCOUNTER — Telehealth: Payer: Self-pay | Admitting: Family Medicine

## 2013-09-20 NOTE — Telephone Encounter (Signed)
Pt is stating that he will be going out of town and pharmacy has changed to [facility] on [address], [place]. [address] and will call back with phone number [place] can be called if any questions.

## 2013-09-21 ENCOUNTER — Telehealth: Payer: Self-pay | Admitting: Family Medicine

## 2013-09-21 NOTE — Telephone Encounter (Signed)
Disregard this message patient will be staying in [facility]

## 2013-09-21 NOTE — Telephone Encounter (Signed)
Patient is needing a refill of tramadol and of his muscle relaxer.  He is supposed to be starting a new job next week.  He uses [facility] on [address].

## 2013-09-22 MED ORDER — TRAMADOL HCL 50 MG PO TABS: 50.0000 mg | ORAL_TABLET | Freq: Three times a day (TID) | ORAL | Status: DC | PRN

## 2013-09-22 MED ORDER — CYCLOBENZAPRINE HCL 5 MG PO TABS: 5.0000 mg | ORAL_TABLET | Freq: Two times a day (BID) | ORAL | Status: DC | PRN

## 2013-09-22 NOTE — Telephone Encounter (Signed)
Refilled Flexeril.  Will forward note to clinic staff to call in Tramadol

## 2013-09-22 NOTE — Telephone Encounter (Signed)
Called rx in  

## 2013-10-25 ENCOUNTER — Other Ambulatory Visit: Payer: Self-pay | Admitting: Family Medicine

## 2014-07-20 HISTORY — PX: HIATAL HERNIA REPAIR: SHX195

## 2016-08-13 ENCOUNTER — Encounter: Payer: Self-pay | Admitting: Student

## 2016-08-13 ENCOUNTER — Ambulatory Visit (INDEPENDENT_AMBULATORY_CARE_PROVIDER_SITE_OTHER): Payer: Medicaid Other | Admitting: Student

## 2016-08-13 VITALS — BP 120/90 | HR 99 | Temp 99.3°F | Ht 69.0 in | Wt 183.0 lb

## 2016-08-13 DIAGNOSIS — C4490 Unspecified malignant neoplasm of skin, unspecified: Secondary | ICD-10-CM | POA: Insufficient documentation

## 2016-08-13 DIAGNOSIS — M5440 Lumbago with sciatica, unspecified side: Secondary | ICD-10-CM

## 2016-08-13 MED ORDER — NAPROXEN 500 MG PO TABS: 500.0000 mg | ORAL_TABLET | Freq: Two times a day (BID) | ORAL | 0 refills | Status: DC

## 2016-08-13 NOTE — Progress Notes (Signed)
Subjective:     Anthony [name] is a 61 y.o. old male here to reestablish care  HPI  Patient moved back from [place] to [place] about a month ago. He lived here before 4 years and moved to [place]. He is currently applying for [facility]. He is also applying for his SSI. Currently he stays with his friend. Reports sleeping on recliner at his friends appartment. He stated that he is on "2 dozens" of medications. He did not bring his medication list with him.  He states that he has signed release at front desk for his MR from [place].  He reports going to gym everyday.   Back pain: he has history of lumbar compression fracture and reports taking Ibuprofen 800 mg three times a day as needed and methocarbamol. He states that he is about to run out of ibuprofen and likes to get his refill.  Pain is at baseline. He denies fever, chills, numbness or weakness in his legs, saddle anesthesia, urine or fecal incontinence or unintentional weight loss. PMH/Problem List: has ALCOHOL ABUSE, UNSPECIFIED; HTN (hypertension); Ulnar neuropathy at wrist; Seborrheic dermatitis of scalp; Trigger finger, acquired; Lumbar compression fracture ([place]); Tachycardia; Tinea versicolor; Poor social situation; Toe pain; and Skin cancer on his problem list.   has a past medical history of Hydrocele; Hypertension; Lumbar compression fracture ([place]); and Skin cancer.  [facility]  Family History  Problem Relation Age of Onset  . Heart attack Father     died at 70 years  . Dementia Father   . Stroke Father   . Cancer Sister     [facility] Social History  Substance Use Topics  . Smoking status: Never Smoker  . Smokeless tobacco: Never Used  . Alcohol use 25.2 oz/week    42 Cans of beer per week     Comment: quit 04/2016    Immunizations needed: reports receiving his flu vaccine in [place]  Review of Systems  Constitutional: Negative for diaphoresis, fatigue, fever and unexpected weight change.  HENT: Negative  for trouble swallowing.   Eyes: Negative for visual disturbance.  Respiratory: Negative for cough, chest tightness, shortness of breath and wheezing.   Cardiovascular: Positive for leg swelling. Negative for chest pain and palpitations.       Has right-sided ankle swelling due to prior ankle injury  Gastrointestinal: Negative for blood in stool, constipation, diarrhea and vomiting.  Genitourinary: Negative for dysuria, genital sores and scrotal swelling.  Musculoskeletal: Positive for back pain. Negative for arthralgias and myalgias.       Chronic  Allergic/Immunologic: Negative for environmental allergies.  Neurological: Negative for light-headedness and headaches.  Hematological: Negative for adenopathy.  Psychiatric/Behavioral: Negative for dysphoric mood. The patient is not nervous/anxious.    Review of Systems Objective:   Physical Exam Vitals:   08/13/16 1425  BP: 120/90  Pulse: 99  Temp: 99.3 F (37.4 C)  TempSrc: Oral  SpO2: 98%  Weight: 183 lb (83 kg)  Height: 5\' 9"  (1.753 m)   GEN: unkempt, here with history back pack, no apparent distress. Head: normocephalic and atraumatic  Oropharynx: mmm without erythema or exudation, poor dentitition HEM: negative for cervical or periauricular lymphadenopathies CVS: RRR, nl s1 & s2, no murmurs, no edema RESP: speaks in full sentence, no IWOB, CTAB GI: BS present & normal, soft, NTND, no guarding, no rebound, no mass MSK: no focal tenderness or notable swelling SKIN: no apparent skin lesion ENDO: negative thyromegally NEURO: alert and oiented appropriately, no gross defecits  PSYCH:  unkempt, circumferential    Assessment & Plan:  Lumbar compression fracture No red flags. Gave Rx for Naproxen 500 mg twice a day as needed for pain.   Establishing care: reviewed and updated history in chart. Patient to return in a month or when he receives his [place] Medicaid. Will review his medications when he brings his medication list or  bottles. Awaiting on his MR from [place]. Signed release form.   He reports receiving his flu vaccine in [place].

## 2016-08-13 NOTE — Patient Instructions (Signed)
It was great seeing you today! I recommend you come back and see [place] in about a month. Please make sure to bring your medications with you when you come back.   If we did any lab work today, and the results require attention, either me or my nurse will get in touch with you. If everything is normal, you will get a letter in mail and a message via . If you don't hear from [place] in two weeks, please give [place] a call. Otherwise, we look forward to seeing you again at your next visit. If you have any questions or concerns before then, please call the clinic at [phone].  Please bring all your medications to every doctors visit  Sign up for My Chart to have easy access to your labs results, and communication with your Primary care physician.    Please check-out at the front desk before leaving the clinic.    Take Care,   Dr. Cyndia Skeeters

## 2016-08-13 NOTE — Assessment & Plan Note (Signed)
No red flags. Gave Rx for Naproxen 500 mg twice a day as needed for pain.

## 2016-09-14 ENCOUNTER — Encounter: Payer: Self-pay | Admitting: Student

## 2016-09-14 DIAGNOSIS — K449 Diaphragmatic hernia without obstruction or gangrene: Secondary | ICD-10-CM | POA: Insufficient documentation

## 2016-09-14 HISTORY — DX: Diaphragmatic hernia without obstruction or gangrene: K44.9

## 2016-10-12 ENCOUNTER — Other Ambulatory Visit: Payer: Self-pay | Admitting: Student

## 2016-10-12 DIAGNOSIS — M5440 Lumbago with sciatica, unspecified side: Secondary | ICD-10-CM

## 2016-10-12 NOTE — Telephone Encounter (Signed)
Pt  calling to request refill of:  Name of Medication(s):  Naproxen Last date of OV:  08-13-16 Pharmacy:  [facility]   Will route refill request to Clinic RN.  Discussed with patient policy to call pharmacy for future refills.  Also, discussed refills may take up to 48 hours to approve or deny.  Renella Cunas

## 2016-10-13 ENCOUNTER — Other Ambulatory Visit: Payer: Self-pay | Admitting: Student

## 2016-10-13 DIAGNOSIS — I10 Essential (primary) hypertension: Secondary | ICD-10-CM

## 2016-10-13 MED ORDER — NAPROXEN 500 MG PO TABS: ORAL_TABLET | ORAL | 0 refills | Status: DC

## 2016-10-14 NOTE — Telephone Encounter (Signed)
pt calling to request refill of:  Name of Medication(s): HCTZ Last date of OV:  08-13-16  Pharmacy:  [facility]  [place]  [address] Please note the pharmacy  Change.  Will route refill request to Clinic RN.  Discussed with patient policy to call pharmacy for future refills.  Also, discussed refills may take up to 48 hours to approve or deny.  Roseanna Rainbow

## 2016-10-20 ENCOUNTER — Telehealth: Payer: Self-pay

## 2016-10-20 NOTE — Telephone Encounter (Signed)
Would like to have Dr.Gonfa order an xray for ribs lower ribs on right hand side. He would like to have these for when you see him on Friday. Please call pt when order is place. Ottis Stain, CMA

## 2016-10-23 ENCOUNTER — Ambulatory Visit: Payer: Medicaid Other | Admitting: Student

## 2016-11-01 ENCOUNTER — Emergency Department (HOSPITAL_COMMUNITY): Payer: Medicaid Other

## 2016-11-01 ENCOUNTER — Encounter (HOSPITAL_COMMUNITY): Payer: Self-pay | Admitting: Emergency Medicine

## 2016-11-01 ENCOUNTER — Inpatient Hospital Stay (HOSPITAL_COMMUNITY)
Admission: EM | Admit: 2016-11-01 | Discharge: 2016-11-07 | DRG: 176 | Disposition: A | Discharge status: Home / Self Care | Payer: Medicaid Other | Attending: Family Medicine | Admitting: Family Medicine

## 2016-11-01 DIAGNOSIS — Z781 Physical restraint status: Secondary | ICD-10-CM

## 2016-11-01 DIAGNOSIS — R451 Restlessness and agitation: Secondary | ICD-10-CM | POA: Diagnosis not present

## 2016-11-01 DIAGNOSIS — M545 Low back pain: Secondary | ICD-10-CM | POA: Diagnosis present

## 2016-11-01 DIAGNOSIS — D696 Thrombocytopenia, unspecified: Secondary | ICD-10-CM | POA: Diagnosis present

## 2016-11-01 DIAGNOSIS — D649 Anemia, unspecified: Secondary | ICD-10-CM | POA: Diagnosis present

## 2016-11-01 DIAGNOSIS — R443 Hallucinations, unspecified: Secondary | ICD-10-CM | POA: Diagnosis not present

## 2016-11-01 DIAGNOSIS — G8929 Other chronic pain: Secondary | ICD-10-CM | POA: Diagnosis present

## 2016-11-01 DIAGNOSIS — M7981 Nontraumatic hematoma of soft tissue: Secondary | ICD-10-CM | POA: Diagnosis not present

## 2016-11-01 DIAGNOSIS — G312 Degeneration of nervous system due to alcohol: Secondary | ICD-10-CM | POA: Diagnosis not present

## 2016-11-01 DIAGNOSIS — T510X1A Toxic effect of ethanol, accidental (unintentional), initial encounter: Secondary | ICD-10-CM | POA: Diagnosis not present

## 2016-11-01 DIAGNOSIS — E872 Acidosis: Secondary | ICD-10-CM | POA: Diagnosis present

## 2016-11-01 DIAGNOSIS — Z79899 Other long term (current) drug therapy: Secondary | ICD-10-CM

## 2016-11-01 DIAGNOSIS — K219 Gastro-esophageal reflux disease without esophagitis: Secondary | ICD-10-CM | POA: Diagnosis present

## 2016-11-01 DIAGNOSIS — N179 Acute kidney failure, unspecified: Secondary | ICD-10-CM

## 2016-11-01 DIAGNOSIS — F1010 Alcohol abuse, uncomplicated: Secondary | ICD-10-CM

## 2016-11-01 DIAGNOSIS — E8729 Other acidosis: Secondary | ICD-10-CM

## 2016-11-01 DIAGNOSIS — Z7982 Long term (current) use of aspirin: Secondary | ICD-10-CM

## 2016-11-01 DIAGNOSIS — I10 Essential (primary) hypertension: Secondary | ICD-10-CM | POA: Diagnosis present

## 2016-11-01 DIAGNOSIS — Z86711 Personal history of pulmonary embolism: Secondary | ICD-10-CM | POA: Diagnosis present

## 2016-11-01 DIAGNOSIS — E162 Hypoglycemia, unspecified: Secondary | ICD-10-CM | POA: Diagnosis present

## 2016-11-01 DIAGNOSIS — Z85828 Personal history of other malignant neoplasm of skin: Secondary | ICD-10-CM

## 2016-11-01 DIAGNOSIS — F10231 Alcohol dependence with withdrawal delirium: Secondary | ICD-10-CM | POA: Diagnosis not present

## 2016-11-01 DIAGNOSIS — E873 Alkalosis: Secondary | ICD-10-CM | POA: Diagnosis present

## 2016-11-01 DIAGNOSIS — I2699 Other pulmonary embolism without acute cor pulmonale: Principal | ICD-10-CM | POA: Diagnosis present

## 2016-11-01 DIAGNOSIS — K5900 Constipation, unspecified: Secondary | ICD-10-CM | POA: Diagnosis not present

## 2016-11-01 DIAGNOSIS — E876 Hypokalemia: Secondary | ICD-10-CM | POA: Diagnosis present

## 2016-11-01 LAB — CBC WITH DIFFERENTIAL/PLATELET
Basophils Absolute: 0 10*3/uL (ref 0.0–0.1)
Basophils Relative: 0 %
Eosinophils Absolute: 0 10*3/uL (ref 0.0–0.7)
Eosinophils Relative: 0 %
HCT: 38.1 % — ABNORMAL LOW (ref 39.0–52.0)
Hemoglobin: 13.4 g/dL (ref 13.0–17.0)
Lymphocytes Relative: 13 %
Lymphs Abs: 1.3 10*3/uL (ref 0.7–4.0)
MCH: 34.4 pg — ABNORMAL HIGH (ref 26.0–34.0)
MCHC: 35.2 g/dL (ref 30.0–36.0)
MCV: 97.7 fL (ref 78.0–100.0)
Monocytes Absolute: 0.5 10*3/uL (ref 0.1–1.0)
Monocytes Relative: 5 %
Neutro Abs: 8.3 10*3/uL — ABNORMAL HIGH (ref 1.7–7.7)
Neutrophils Relative %: 82 %
Platelets: 122 10*3/uL — ABNORMAL LOW (ref 150–400)
RBC: 3.9 MIL/uL — ABNORMAL LOW (ref 4.22–5.81)
RDW: 13.6 % (ref 11.5–15.5)
WBC: 10.1 10*3/uL (ref 4.0–10.5)

## 2016-11-01 LAB — I-STAT TROPONIN, ED: TROPONIN I, POC: 0.02 ng/mL (ref 0.00–0.08)

## 2016-11-01 MED ORDER — THIAMINE HCL 100 MG/ML IJ SOLN
100.0000 mg | Freq: Once | INTRAMUSCULAR | Status: AC
  Administered 2016-11-02: 100 mg via INTRAVENOUS
  Filled 2016-11-01: qty 2

## 2016-11-01 MED ORDER — LORAZEPAM 2 MG/ML IJ SOLN
1.0000 mg | Freq: Once | INTRAMUSCULAR | Status: AC
  Administered 2016-11-02: 1 mg via INTRAVENOUS
  Filled 2016-11-01: qty 1

## 2016-11-01 MED ORDER — SODIUM CHLORIDE 0.9 % IV BOLUS (SEPSIS)
1000.0000 mL | Freq: Once | INTRAVENOUS | Status: AC
  Administered 2016-11-02: 1000 mL via INTRAVENOUS

## 2016-11-01 NOTE — ED Provider Notes (Signed)
TIME SEEN: 11:10 PM  CHIEF COMPLAINT: SOB  HPI: Patient is a 61 y.o. M with h/o ETOH abuse, HTN who presents to the ED with complaints of SOB and intermittent chest tightness x 3 months.  History is limited as patient is a very poor historian. He reports he came in tonight because shortness of breath was worse with exertion and better with rest. He states he has had some "congestion" and thinks that this is consistent with "hayfever". He denies fevers, cough. No lower extremity swelling or pain. No history of diabetes, hyperlipidemia, tobacco use. Patient has a previous history of alcohol abuse but states that he quit drinking alcohol in November 2017 but then reports that he drinks 3 days ago. He is tachycardic here which he reports is normal for him. He states he does not think he is in alcohol withdrawal but does appear tremulous here. He states he has not slept in several days. States he has had intermittent chest tightness. None currently. Also reports right chest pain that is sharp in nature that he attributes to previous rib fractures that he states he thinks that he had after a fall 5 years ago. No new injury to the chest.  ROS: See HPI Constitutional: no fever  Eyes: no drainage  ENT: no runny nose   Cardiovascular:  chest pain  Resp:  SOB  GI: no vomiting GU: no dysuria Integumentary: no rash  Allergy: no hives  Musculoskeletal: no leg swelling  Neurological: no slurred speech ROS otherwise negative  PAST MEDICAL HISTORY/PAST SURGICAL HISTORY:  Past Medical History:  Diagnosis Date  . Hydrocele    Surgically corrected  . Hypertension   . Lumbar compression fracture ([place])   . Skin cancer    exciced 2017    MEDICATIONS:  Prior to Admission medications   Medication Sig Start Date End Date Taking? Authorizing Provider  acetaminophen (TYLENOL) 325 MG tablet Take 650 mg by mouth daily. For pain    Historical Provider, MD  aspirin EC 325 MG tablet Take 325 mg by mouth at bedtime  as needed. For pain    Historical Provider, MD  citalopram (CELEXA) 20 MG tablet Take 1 tablet (20 mg total) by mouth daily. 08/10/13   Alveda Reasons, MD  cyclobenzaprine (FLEXERIL) 5 MG tablet Take 1 tablet (5 mg total) by mouth 2 (two) times daily as needed for muscle spasms. 09/22/13   Alveda Reasons, MD  cyclobenzaprine (FLEXERIL) 5 MG tablet take 1 tablet by mouth twice a day if needed for MUSCLE SPASMS 10/25/13   Alveda Reasons, MD  hydrochlorothiazide (HYDRODIURIL) 25 MG tablet TAKE 1 TABLET BY MOUTH EVERY DAY 10/14/16   Mercy Riding, MD  ketoconazole (NIZORAL) 2 % cream Apply 1 application topically daily. X 14 days 06/08/13   Alveda Reasons, MD  LORazepam (ATIVAN) 0.5 MG tablet Take 1 tablet (0.5 mg total) by mouth at bedtime as needed for anxiety. 07/06/13   Alveda Reasons, MD  metoprolol succinate (TOPROL-XL) 25 MG 24 hr tablet Take 1 tablet (25 mg total) by mouth daily. 05/23/13   Alveda Reasons, MD  naproxen (NAPROSYN) 500 MG tablet Take 1 tablet (500 mg total) by mouth up to 2 (two) times daily with meals as needed for pain. Don't take this with Aspirn! 10/13/16   Mercy Riding, MD  omeprazole (PRILOSEC) 40 MG capsule Take 1 capsule (40 mg total) by mouth daily. 05/05/13   Coral Spikes, DO  peg 3350 powder (  MOVIPREP) 100 G SOLR Take 1 kit (200 g total) by mouth once. 08/22/13   Ladene Artist, MD  traMADol Veatrice Bourbon) 50 MG tablet take 1 tablet by mouth every 8 hours if needed 10/25/13   Alveda Reasons, MD    ALLERGIES:  No Known Allergies  SOCIAL HISTORY:  Social History  Substance Use Topics  . Smoking status: Never Smoker  . Smokeless tobacco: Never Used  . Alcohol use 25.2 oz/week    42 Cans of beer per week     Comment: quit 04/2016    FAMILY HISTORY: Family History  Problem Relation Age of Onset  . Heart attack Father     died at 9 years  . Dementia Father   . Stroke Father   . Cancer Sister     EXAM: BP (!) 114/98 (BP Location: Right Arm)   Pulse (!) 126    Temp 98.7 F (37.1 C) (Oral)   SpO2 92%  CONSTITUTIONAL: Alert and oriented and responds appropriately to questions. Chronically ill-appearing; well-nourished HEAD: Normocephalic EYES: Conjunctivae clear, pupils appear equal, EOMI ENT: normal nose; moist mucous membranes NECK: Supple, no meningismus, no nuchal rigidity, no LAD  CARD: Regular and tachycardic; S1 and S2 appreciated; no murmurs, no clicks, no rubs, no gallops RESP: Normal chest excursion without splinting or tachypnea; breath sounds clear and equal bilaterally; no wheezes, no rhonchi, no rales, no hypoxia or respiratory distress, speaking full sentences ABD/GI: Normal bowel sounds; non-distended; soft, non-tender, no rebound, no guarding, no peritoneal signs, no hepatosplenomegaly BACK:  The back appears normal and is non-tender to palpation, there is no CVA tenderness EXT: Normal ROM in all joints; non-tender to palpation; no edema; normal capillary refill; no cyanosis, no calf tenderness or swelling    SKIN: Normal color for age and race; warm; no rash NEURO: Moves all extremities equally, slightly tremulous PSYCH: The patient's mood and manner are appropriate. Grooming and personal hygiene are appropriate.  MEDICAL DECISION MAKING: Patient here shortness of breath the past 3 months. Lungs are clear. No history of tobacco use, asthma or COPD. Reports a history of benign skin cancer that has been removed. No other risk factors other than age for pulmonary embolus but this is also on the differential. Also concerned for possible pneumonia given he is so tachycardic, reports congestion and feels warm to touch. We'll check a rectal temperature. Given intermittent chest tightness with shortness of breath worse with exertion this could also be his anginal equivalent. Will obtain labs including troponin, d-dimer. Will obtain chest x-ray. His tachycardia and tremulousness may be also from alcohol withdrawal. We'll give IV fluids, IV Ativan  and reassess.  ED PROGRESS: Patient's labs show what appears to be acute renal failure. His bicarbonate is slightly low and he does have an elevated anion gap which may be from uremia. Normal glucose. We'll give second liter of IV fluids. Troponin is negative. D-dimer positive. Chest x-ray clear. We'll obtain CT of the chest for further evaluation to rule out pulmonary embolus. Plan is to repeat second troponin in 3 hours.  1:10 AM  Pt's CT shows acute right sided PE with evidence of right heart strain.  Patient is still hemodynamically stable. We'll start heparin. Will discuss with family medicine. Patient is a full code.   1:37 AM Discussed patient's case with family medicine resident.  I have recommended admission and patient (and family if present) agree with this plan. Admitting physician will place admission orders.   I reviewed all nursing  notes, vitals, pertinent previous records, EKGs, lab and urine results, imaging (as available).    EKG Interpretation  Date/Time:  Sunday November 01 2016 22:51:58 EDT Ventricular Rate:  140 PR Interval:    QRS Duration: 93 QT Interval:  317 QTC Calculation: 493 R Axis:   69 Text Interpretation:  Sinus tachycardia Multiform ventricular premature complexes Aberrant conduction of SV complex(es) Minimal ST depression, diffuse leads Borderline prolonged QT interval Confirmed by Madina Galati,  DO, [employer] ([id number]) on 11/01/2016 11:10:25 PM        CRITICAL CARE Performed by: Nyra Jabs   Total critical care time: 45 minutes  Critical care time was exclusive of separately billable procedures and treating other patients.  Critical care was necessary to treat or prevent imminent or life-threatening deterioration.  Critical care was time spent personally by me on the following activities: development of treatment plan with patient and/or surrogate as well as nursing, discussions with consultants, evaluation of patient's response to treatment, examination  of patient, obtaining history from patient or surrogate, ordering and performing treatments and interventions, ordering and review of laboratory studies, ordering and review of radiographic studies, pulse oximetry and re-evaluation of patient's condition.     [place], DO 11/02/16 [phone]

## 2016-11-01 NOTE — ED Triage Notes (Signed)
Per EMS: Pt c/o of SOB x 3 months.  Tonight worse with excerption and dizziness. A&Ox4.  VSS CBG 159

## 2016-11-02 ENCOUNTER — Emergency Department (HOSPITAL_COMMUNITY): Payer: Medicaid Other

## 2016-11-02 ENCOUNTER — Ambulatory Visit: Payer: Medicaid Other | Admitting: Student

## 2016-11-02 ENCOUNTER — Inpatient Hospital Stay (HOSPITAL_COMMUNITY): Payer: Medicaid Other

## 2016-11-02 ENCOUNTER — Encounter (HOSPITAL_COMMUNITY): Payer: Self-pay | Admitting: Radiology

## 2016-11-02 DIAGNOSIS — D649 Anemia, unspecified: Secondary | ICD-10-CM | POA: Diagnosis present

## 2016-11-02 DIAGNOSIS — D696 Thrombocytopenia, unspecified: Secondary | ICD-10-CM | POA: Diagnosis present

## 2016-11-02 DIAGNOSIS — E876 Hypokalemia: Secondary | ICD-10-CM | POA: Diagnosis present

## 2016-11-02 DIAGNOSIS — E872 Acidosis: Secondary | ICD-10-CM | POA: Insufficient documentation

## 2016-11-02 DIAGNOSIS — I2699 Other pulmonary embolism without acute cor pulmonale: Secondary | ICD-10-CM

## 2016-11-02 DIAGNOSIS — K219 Gastro-esophageal reflux disease without esophagitis: Secondary | ICD-10-CM | POA: Diagnosis present

## 2016-11-02 DIAGNOSIS — Z86711 Personal history of pulmonary embolism: Secondary | ICD-10-CM | POA: Diagnosis present

## 2016-11-02 DIAGNOSIS — R0600 Dyspnea, unspecified: Secondary | ICD-10-CM | POA: Diagnosis not present

## 2016-11-02 DIAGNOSIS — G312 Degeneration of nervous system due to alcohol: Secondary | ICD-10-CM | POA: Diagnosis not present

## 2016-11-02 DIAGNOSIS — R451 Restlessness and agitation: Secondary | ICD-10-CM | POA: Diagnosis not present

## 2016-11-02 DIAGNOSIS — Z7982 Long term (current) use of aspirin: Secondary | ICD-10-CM | POA: Diagnosis not present

## 2016-11-02 DIAGNOSIS — M7981 Nontraumatic hematoma of soft tissue: Secondary | ICD-10-CM | POA: Diagnosis not present

## 2016-11-02 DIAGNOSIS — Z79899 Other long term (current) drug therapy: Secondary | ICD-10-CM | POA: Diagnosis not present

## 2016-11-02 DIAGNOSIS — Z781 Physical restraint status: Secondary | ICD-10-CM | POA: Diagnosis not present

## 2016-11-02 DIAGNOSIS — F10231 Alcohol dependence with withdrawal delirium: Secondary | ICD-10-CM | POA: Diagnosis not present

## 2016-11-02 DIAGNOSIS — Z85828 Personal history of other malignant neoplasm of skin: Secondary | ICD-10-CM | POA: Diagnosis not present

## 2016-11-02 DIAGNOSIS — K5900 Constipation, unspecified: Secondary | ICD-10-CM | POA: Diagnosis not present

## 2016-11-02 DIAGNOSIS — I10 Essential (primary) hypertension: Secondary | ICD-10-CM | POA: Diagnosis present

## 2016-11-02 DIAGNOSIS — N179 Acute kidney failure, unspecified: Secondary | ICD-10-CM | POA: Insufficient documentation

## 2016-11-02 DIAGNOSIS — R443 Hallucinations, unspecified: Secondary | ICD-10-CM | POA: Diagnosis not present

## 2016-11-02 DIAGNOSIS — M545 Low back pain: Secondary | ICD-10-CM | POA: Diagnosis present

## 2016-11-02 DIAGNOSIS — G8929 Other chronic pain: Secondary | ICD-10-CM | POA: Diagnosis present

## 2016-11-02 DIAGNOSIS — E162 Hypoglycemia, unspecified: Secondary | ICD-10-CM | POA: Diagnosis present

## 2016-11-02 DIAGNOSIS — E873 Alkalosis: Secondary | ICD-10-CM | POA: Diagnosis present

## 2016-11-02 DIAGNOSIS — I2609 Other pulmonary embolism with acute cor pulmonale: Secondary | ICD-10-CM

## 2016-11-02 DIAGNOSIS — E8729 Other acidosis: Secondary | ICD-10-CM | POA: Insufficient documentation

## 2016-11-02 DIAGNOSIS — T510X1A Toxic effect of ethanol, accidental (unintentional), initial encounter: Secondary | ICD-10-CM | POA: Diagnosis not present

## 2016-11-02 HISTORY — DX: Personal history of pulmonary embolism: Z86.711

## 2016-11-02 LAB — COMPREHENSIVE METABOLIC PANEL
ALT: 45 U/L (ref 17–63)
AST: 57 U/L — AB (ref 15–41)
Albumin: 4.1 g/dL (ref 3.5–5.0)
Alkaline Phosphatase: 89 U/L (ref 38–126)
Anion gap: 20 — ABNORMAL HIGH (ref 5–15)
BUN: 53 mg/dL — AB (ref 6–20)
CHLORIDE: 89 mmol/L — AB (ref 101–111)
CO2: 21 mmol/L — ABNORMAL LOW (ref 22–32)
CREATININE: 1.74 mg/dL — AB (ref 0.61–1.24)
Calcium: 9.5 mg/dL (ref 8.9–10.3)
GFR calc Af Amer: 47 mL/min — ABNORMAL LOW (ref >60)
GFR, EST NON AFRICAN AMERICAN: 41 mL/min — AB (ref >60)
GLUCOSE: 92 mg/dL (ref 65–99)
Potassium: 3.1 mmol/L — ABNORMAL LOW (ref 3.5–5.1)
Sodium: 130 mmol/L — ABNORMAL LOW (ref 135–145)
Total Bilirubin: 1.5 mg/dL — ABNORMAL HIGH (ref 0.3–1.2)
Total Protein: 7.3 g/dL (ref 6.5–8.1)

## 2016-11-02 LAB — OSMOLALITY: Osmolality: 293 mOsm/kg (ref 275–295)

## 2016-11-02 LAB — HEPARIN LEVEL (UNFRACTIONATED)
HEPARIN UNFRACTIONATED: 1.06 [IU]/mL — AB (ref 0.30–0.70)
Heparin Unfractionated: 0.45 IU/mL (ref 0.30–0.70)

## 2016-11-02 LAB — GLUCOSE, CAPILLARY
GLUCOSE-CAPILLARY: 75 mg/dL (ref 65–99)
GLUCOSE-CAPILLARY: 123 mg/dL — AB (ref 65–99)
GLUCOSE-CAPILLARY: 65 mg/dL (ref 65–99)
Glucose-Capillary: 101 mg/dL — ABNORMAL HIGH (ref 65–99)
Glucose-Capillary: 90 mg/dL (ref 65–99)
Glucose-Capillary: 132 mg/dL — ABNORMAL HIGH (ref 65–99)

## 2016-11-02 LAB — TROPONIN I
TROPONIN I: 0.03 ng/mL — AB (ref <0.03)
TROPONIN I: 0.03 ng/mL — AB (ref <0.03)
Troponin I: <0.03 ng/mL (ref <0.03)

## 2016-11-02 LAB — POCT I-STAT 3, ART BLOOD GAS (G3+)
Acid-base deficit: 7 mmol/L — ABNORMAL HIGH (ref 0.0–2.0)
Bicarbonate: 16.1 mmol/L — ABNORMAL LOW (ref 20.0–28.0)
O2 Saturation: 96 %
PCO2 ART: 25.6 mmHg — AB (ref 32.0–48.0)
PH ART: 7.405 (ref 7.350–7.450)
PO2 ART: 79 mmHg — AB (ref 83.0–108.0)
Patient temperature: 98.1
TCO2: 17 mmol/L (ref 0–100)

## 2016-11-02 LAB — BASIC METABOLIC PANEL
ANION GAP: 17 — AB (ref 5–15)
BUN: 47 mg/dL — ABNORMAL HIGH (ref 6–20)
CHLORIDE: 100 mmol/L — AB (ref 101–111)
CO2: 18 mmol/L — ABNORMAL LOW (ref 22–32)
CREATININE: 1.23 mg/dL (ref 0.61–1.24)
Calcium: 8.4 mg/dL — ABNORMAL LOW (ref 8.9–10.3)
GFR calc non Af Amer: >60 mL/min (ref >60)
Glucose, Bld: 62 mg/dL — ABNORMAL LOW (ref 65–99)
POTASSIUM: 3.3 mmol/L — AB (ref 3.5–5.1)
Sodium: 135 mmol/L (ref 135–145)

## 2016-11-02 LAB — LACTIC ACID, PLASMA
Lactic Acid, Venous: 1.3 mmol/L (ref 0.5–1.9)
Lactic Acid, Venous: 1.1 mmol/L (ref 0.5–1.9)

## 2016-11-02 LAB — I-STAT TROPONIN, ED: Troponin i, poc: 0.02 ng/mL (ref 0.00–0.08)

## 2016-11-02 LAB — MRSA PCR SCREENING: MRSA BY PCR: POSITIVE — AB

## 2016-11-02 LAB — ECHOCARDIOGRAM COMPLETE
Height: 72 in
WEIGHTICAEL: 2740.76 [oz_av]

## 2016-11-02 LAB — D-DIMER, QUANTITATIVE (NOT AT ARMC): D DIMER QUANT: 2.17 ug{FEU}/mL — AB (ref 0.00–0.50)

## 2016-11-02 LAB — URINALYSIS, ROUTINE W REFLEX MICROSCOPIC
BILIRUBIN URINE: NEGATIVE
GLUCOSE, UA: NEGATIVE mg/dL
HGB URINE DIPSTICK: NEGATIVE
KETONES UR: 5 mg/dL — AB
Leukocytes, UA: NEGATIVE
Nitrite: NEGATIVE
PROTEIN: NEGATIVE mg/dL
Specific Gravity, Urine: 1.038 — ABNORMAL HIGH (ref 1.005–1.030)
pH: 6 (ref 5.0–8.0)

## 2016-11-02 LAB — RAPID URINE DRUG SCREEN, HOSP PERFORMED
AMPHETAMINES: NOT DETECTED
BARBITURATES: NOT DETECTED
BENZODIAZEPINES: NOT DETECTED
Cocaine: NOT DETECTED
Opiates: NOT DETECTED
TETRAHYDROCANNABINOL: NOT DETECTED

## 2016-11-02 LAB — RENAL FUNCTION PANEL
Albumin: 3.5 g/dL (ref 3.5–5.0)
Anion gap: 16 — ABNORMAL HIGH (ref 5–15)
BUN: 44 mg/dL — AB (ref 6–20)
CHLORIDE: 94 mmol/L — AB (ref 101–111)
CO2: 21 mmol/L — AB (ref 22–32)
Calcium: 8.4 mg/dL — ABNORMAL LOW (ref 8.9–10.3)
Creatinine, Ser: 1.27 mg/dL — ABNORMAL HIGH (ref 0.61–1.24)
GFR calc Af Amer: >60 mL/min (ref >60)
GFR, EST NON AFRICAN AMERICAN: 59 mL/min — AB (ref >60)
Glucose, Bld: 75 mg/dL (ref 65–99)
POTASSIUM: 3.3 mmol/L — AB (ref 3.5–5.1)
Phosphorus: 4.8 mg/dL — ABNORMAL HIGH (ref 2.5–4.6)
Sodium: 131 mmol/L — ABNORMAL LOW (ref 135–145)

## 2016-11-02 LAB — HIV ANTIBODY (ROUTINE TESTING W REFLEX): HIV Screen 4th Generation wRfx: NONREACTIVE

## 2016-11-02 LAB — ETHANOL: Alcohol, Ethyl (B): <5 mg/dL (ref <5)

## 2016-11-02 LAB — BRAIN NATRIURETIC PEPTIDE: B NATRIURETIC PEPTIDE 5: 72.1 pg/mL (ref 0.0–100.0)

## 2016-11-02 LAB — AMMONIA: Ammonia: 38 umol/L — ABNORMAL HIGH (ref 9–35)

## 2016-11-02 LAB — TSH: TSH: 3.349 u[IU]/mL (ref 0.350–4.500)

## 2016-11-02 MED ORDER — SODIUM CHLORIDE 0.9 % IV SOLN
30.0000 meq | Freq: Once | INTRAVENOUS | Status: AC
  Administered 2016-11-02: 30 meq via INTRAVENOUS
  Filled 2016-11-02 (×2): qty 15

## 2016-11-02 MED ORDER — DEXTROSE-NACL 5-0.9 % IV SOLN
INTRAVENOUS | Status: DC
  Administered 2016-11-02 – 2016-11-03 (×3): via INTRAVENOUS

## 2016-11-02 MED ORDER — LORAZEPAM 2 MG/ML IJ SOLN
INTRAMUSCULAR | Status: AC
  Administered 2016-11-02: 2 mg
  Filled 2016-11-02: qty 1

## 2016-11-02 MED ORDER — SODIUM CHLORIDE 0.9% FLUSH
3.0000 mL | Freq: Two times a day (BID) | INTRAVENOUS | Status: DC
  Administered 2016-11-02 – 2016-11-07 (×5): 3 mL via INTRAVENOUS

## 2016-11-02 MED ORDER — DEXTROSE 50 % IV SOLN
25.0000 mL | Freq: Once | INTRAVENOUS | Status: AC
  Administered 2016-11-02: 25 mL via INTRAVENOUS
  Filled 2016-11-02: qty 50

## 2016-11-02 MED ORDER — SODIUM CHLORIDE 0.9 % IV SOLN
INTRAVENOUS | Status: DC
  Administered 2016-11-02: 05:00:00 via INTRAVENOUS

## 2016-11-02 MED ORDER — HEPARIN BOLUS VIA INFUSION
4000.0000 [IU] | Freq: Once | INTRAVENOUS | Status: AC
  Administered 2016-11-02: 4000 [IU] via INTRAVENOUS
  Filled 2016-11-02: qty 4000

## 2016-11-02 MED ORDER — MIDAZOLAM HCL 2 MG/2ML IJ SOLN: 2.0000 mg | Freq: Once | INTRAMUSCULAR | Status: DC

## 2016-11-02 MED ORDER — LORAZEPAM 2 MG/ML IJ SOLN
4.0000 mg | Freq: Once | INTRAMUSCULAR | Status: AC
  Administered 2016-11-02: 4 mg via INTRAVENOUS
  Filled 2016-11-02: qty 2

## 2016-11-02 MED ORDER — LORAZEPAM 2 MG/ML IJ SOLN
1.0000 mg | Freq: Once | INTRAMUSCULAR | Status: AC
  Administered 2016-11-02: 1 mg via INTRAVENOUS
  Filled 2016-11-02: qty 1

## 2016-11-02 MED ORDER — ACETAMINOPHEN 325 MG PO TABS
650.0000 mg | ORAL_TABLET | Freq: Four times a day (QID) | ORAL | Status: DC | PRN
Start: 2016-11-02 — End: 2016-11-02

## 2016-11-02 MED ORDER — SODIUM CHLORIDE 0.9 % IV BOLUS (SEPSIS)
500.0000 mL | Freq: Once | INTRAVENOUS | Status: AC
  Administered 2016-11-02: 500 mL via INTRAVENOUS

## 2016-11-02 MED ORDER — DEXMEDETOMIDINE HCL IN NACL 400 MCG/100ML IV SOLN
0.4000 ug/kg/h | INTRAVENOUS | Status: DC
  Administered 2016-11-02: 1.2 ug/kg/h via INTRAVENOUS
  Administered 2016-11-02: 1 ug/kg/h via INTRAVENOUS
  Administered 2016-11-03 (×2): 0.9 ug/kg/h via INTRAVENOUS
  Filled 2016-11-02 (×4): qty 100

## 2016-11-02 MED ORDER — LORAZEPAM 2 MG/ML IJ SOLN
1.0000 mg | INTRAMUSCULAR | Status: DC | PRN
  Administered 2016-11-02: 2 mg via INTRAVENOUS
  Filled 2016-11-02: qty 1

## 2016-11-02 MED ORDER — LABETALOL HCL 5 MG/ML IV SOLN: 10.0000 mg | INTRAVENOUS | Status: DC | PRN

## 2016-11-02 MED ORDER — PANTOPRAZOLE SODIUM 40 MG IV SOLR
40.0000 mg | Freq: Every day | INTRAVENOUS | Status: DC
  Administered 2016-11-02 – 2016-11-03 (×2): 40 mg via INTRAVENOUS
  Filled 2016-11-02 (×2): qty 40

## 2016-11-02 MED ORDER — FOLIC ACID 5 MG/ML IJ SOLN
1.0000 mg | Freq: Every day | INTRAMUSCULAR | Status: DC
  Administered 2016-11-02 – 2016-11-04 (×3): 1 mg via INTRAVENOUS
  Filled 2016-11-02 (×4): qty 0.2

## 2016-11-02 MED ORDER — THIAMINE HCL 100 MG/ML IJ SOLN
100.0000 mg | Freq: Every day | INTRAMUSCULAR | Status: DC
  Administered 2016-11-03 – 2016-11-04 (×2): 100 mg via INTRAVENOUS
  Filled 2016-11-02 (×2): qty 1
  Filled 2016-11-02 (×2): qty 2

## 2016-11-02 MED ORDER — SODIUM CHLORIDE 0.9 % IV SOLN
0.4000 ug/kg/h | INTRAVENOUS | Status: DC
  Administered 2016-11-02: 1.3 ug/kg/h via INTRAVENOUS
  Administered 2016-11-02: 0.4 ug/kg/h via INTRAVENOUS
  Filled 2016-11-02 (×2): qty 2

## 2016-11-02 MED ORDER — HEPARIN (PORCINE) IN NACL 100-0.45 UNIT/ML-% IJ SOLN
1150.0000 [IU]/h | INTRAMUSCULAR | Status: DC
  Administered 2016-11-02: 1200 [IU]/h via INTRAVENOUS
  Administered 2016-11-03 – 2016-11-05 (×3): 1000 [IU]/h via INTRAVENOUS
  Administered 2016-11-06: 1150 [IU]/h via INTRAVENOUS
  Filled 2016-11-02 (×7): qty 250

## 2016-11-02 MED ORDER — VITAMIN B-1 100 MG PO TABS: 100.0000 mg | ORAL_TABLET | Freq: Every day | ORAL | Status: DC

## 2016-11-02 MED ORDER — HEPARIN (PORCINE) IN NACL 100-0.45 UNIT/ML-% IJ SOLN
1400.0000 [IU]/h | INTRAMUSCULAR | Status: DC
  Administered 2016-11-02: 1400 [IU]/h via INTRAVENOUS
  Filled 2016-11-02 (×3): qty 250

## 2016-11-02 MED ORDER — SODIUM CHLORIDE 0.9 % IV BOLUS (SEPSIS)
1000.0000 mL | Freq: Once | INTRAVENOUS | Status: AC
  Administered 2016-11-02: 1000 mL via INTRAVENOUS

## 2016-11-02 MED ORDER — ADULT MULTIVITAMIN W/MINERALS CH
1.0000 | ORAL_TABLET | Freq: Every day | ORAL | Status: DC
  Administered 2016-11-04 – 2016-11-07 (×4): 1 via ORAL
  Filled 2016-11-02 (×6): qty 1

## 2016-11-02 MED ORDER — PANTOPRAZOLE SODIUM 40 MG PO TBEC: 40.0000 mg | DELAYED_RELEASE_TABLET | Freq: Every day | ORAL | Status: DC

## 2016-11-02 MED ORDER — MUPIROCIN 2 % EX OINT
1.0000 "application " | TOPICAL_OINTMENT | Freq: Two times a day (BID) | CUTANEOUS | Status: AC
  Administered 2016-11-02 – 2016-11-06 (×10): 1 via NASAL
  Filled 2016-11-02 (×3): qty 22

## 2016-11-02 MED ORDER — LORAZEPAM 2 MG/ML IJ SOLN
1.0000 mg | Freq: Four times a day (QID) | INTRAMUSCULAR | Status: DC | PRN
  Administered 2016-11-02: 1 mg via INTRAVENOUS

## 2016-11-02 MED ORDER — CHLORHEXIDINE GLUCONATE CLOTH 2 % EX PADS
6.0000 | MEDICATED_PAD | Freq: Every day | CUTANEOUS | Status: AC
  Administered 2016-11-03 – 2016-11-06 (×3): 6 via TOPICAL

## 2016-11-02 MED ORDER — FENTANYL CITRATE (PF) 100 MCG/2ML IJ SOLN
25.0000 ug | INTRAMUSCULAR | Status: DC | PRN
  Filled 2016-11-02: qty 2

## 2016-11-02 MED ORDER — LORAZEPAM 2 MG/ML IJ SOLN
2.0000 mg | Freq: Once | INTRAMUSCULAR | Status: AC
  Filled 2016-11-02: qty 1

## 2016-11-02 MED ORDER — IOPAMIDOL (ISOVUE-370) INJECTION 76%
INTRAVENOUS | Status: AC
  Administered 2016-11-02: 80 mL
  Filled 2016-11-02: qty 100

## 2016-11-02 MED ORDER — HYDROCHLOROTHIAZIDE 25 MG PO TABS: 25.0000 mg | ORAL_TABLET | Freq: Every day | ORAL | Status: DC

## 2016-11-02 MED ORDER — FOLIC ACID 1 MG PO TABS: 1.0000 mg | ORAL_TABLET | Freq: Every day | ORAL | Status: DC

## 2016-11-02 MED ORDER — ACETAMINOPHEN 650 MG RE SUPP: 650.0000 mg | Freq: Four times a day (QID) | RECTAL | Status: DC | PRN

## 2016-11-02 MED ORDER — LORAZEPAM 1 MG PO TABS: 1.0000 mg | ORAL_TABLET | Freq: Four times a day (QID) | ORAL | Status: DC | PRN

## 2016-11-02 NOTE — Progress Notes (Signed)
S:  Called to assess at bedside for agitation / airway concerns   O: Blood pressure (!) 93/58, pulse 84, temperature 99.1 F (37.3 C), temperature source Oral, resp. rate (!) 30, height 6' (1.829 m), weight 171 lb 4.8 oz (77.7 kg), SpO2 92 %.  Sats 99% on 2L  General: adult male, lying in bed HEENT: MM pink, pupils 2-52mm PSY: sedate / calm, arouses with mild intermittent agitation and falls back to sleep  Neuro: sedate, MAE spontaneously  CV: s1s2 rrr, no m/r/g PULM: even/non-labored, lungs bilaterally diminished  BE:EFEO, non-tender, bsx4 active  Extremities: warm/dry, no edema, cap refill <3 sec in extremities  Skin: no rashes or lesions  ABG    Component Value Date/Time   PHART 7.405 11/02/2016 0829   PCO2ART 25.6 (L) 11/02/2016 0829   PO2ART 79.0 (L) 11/02/2016 0829   HCO3 16.1 (L) 11/02/2016 0829   TCO2 17 11/02/2016 0829   ACIDBASEDEF 7.0 (H) 11/02/2016 0829   O2SAT 96.0 11/02/2016 0829    Recent Labs Lab 11/01/16 2332  HGB 13.4  HCT 38.1*  WBC 10.1  PLT 122*    Recent Labs Lab 11/01/16 2332 11/02/16 0448 11/02/16 0822  NA 130* 131* 135  K 3.1* 3.3* 3.3*  CL 89* 94* 100*  CO2 21* 21* 18*  GLUCOSE 92 75 62*  BUN 53* 44* 47*  CREATININE 1.74* 1.27* 1.23  CALCIUM 9.5 8.4* 8.4*  PHOS  --  4.8*  --     Osmol 293 Ethyl Alcohol <5 UDS >> negative   A: Acute PE with RV Strain   Acute Encephalopathy  ETOH Withdrawal - day 3 since last ETOH per staff Chronic Respiratory Alkalosis AG MA AKI     P: Continue precedex gtt > discussed weaning with RN.   RASS Goal: 0 to -1  Continue thiamine / folate / MVI No indication for intubation at this time Monitor respiratory status closely   O2 as needed to support sat > 90% Intermittent CXR  Continue heparin gtt  Check ammonia with next lab draw  Noe Gens, NP-C [facility] Pgr: [phone] or if no answer [phone] 11/02/2016, 12:01 PM

## 2016-11-02 NOTE — Progress Notes (Signed)
Pt continues to be very disoriented, combative, and restless despite restraints and pharmacological interventions. Pt has now received total of 5mg  of ativan IV without improvement, MD notified, came to bedside, abnormal vitals (BP 167/116, HR 157, SpO2 97% on room air, temperature of 98.72F, and 24 respirations per minute), MD placed order for another dose of 4mg  of ativan IV. Rapid response also notified of patient's altered awareness and failure to respond to medications via CIWA protocol. RN will continue to monitor pt and initiated new orders, pt call bell within reach, bed in lowest position with bed alarm in use, relaxation techniques implemented to decrease stimulation.      Izola Price, RN 11/02/2016

## 2016-11-02 NOTE — H&P (Signed)
[facility] Admission History and Physical Service Pager: [phone]  Patient name: Breland Trouten Troost Medical record number: [id number] Date of birth: [birth year] Age: 61 y.o. Gender: male  Primary Care Provider: Mercy Riding, MD Consultants: none Code Status: FULL  Chief Complaint: shortness of breath  Assessment and Plan: Stevan A Morrisette is a 61 y.o. male presenting with shortness of breath. PMH is significant for HTN, EtOH abuse, h/o skin cancer, chronic back pain.  Acute submassive pulmonary embolus. Presented with acute worsening of shortness of breath and chest tightness, tachycardic into HR 120s with elevated D-dimer 2.17 found to have acute PE with evidence of R heart strain on CTA chest imaging. No signs or symptoms of DVT. EKG with sinus tachycardia and minimal ST depression in diffuse leads. POC troponin neg x2. No signs of fluid overload on exam and patient is hemodynamically stable.  - Admit to [facility], attending Dr. Andria Frames - Heparin gtt started in the ED - Trending troponins - monitor on telemetry  - Echo - Holding home metoprolol - BNP pending - am EKG  Acute renal injury with high anion gap. SCr 1.74 with AG 20. BUN to creatinine ratio >20 suggesting prerenal etiology. However DDx includes causes of high anion gap metabolic acidosis including alcoholic ketoacidosis. Lactic acid 1.3.  - monitor BMET - lactic acid pending - s/p NS bolus x2 - IVF NS@ 125cc/hr - low threshold to provide NS bolus  EtOH abuse. Patient is unreliable historian. On admission stated last drink was 1 week ago but told ED provider was 3 days ago. EtOH level <5. UDS neg. Was tremulous and agitated in the ED requiring multiple doses of Ativan. Will monitor closely in case need to start on scheduled dosing. - CIWA protocol  HTN. BP on admission 142/96. At home takes HCTZ 25mg  qd. States did take medication yesterday. Does not take home metoprolol - continue home HCTZ  GERD At  home omeprazole 40mg  qd - protonix  H/o skin cancer areas of pink discoloration on face and head. Will need to follow up as outpatient.  FEN/GI: regular diet. NS@125 , protonix Prophylaxis: heparin gtt  Disposition: admit to FPTS  History of Present Illness:  Katsumi A Yutzy is a 61 y.o. male presenting with shortness of breath. Patient is a poor historian with tangential speech.  States noticed worsening SOB over the past 2 weeks that with acutely worsened yesterday associated with some chest tightness. Worse with exertion, was not able to walk "just sat because couldn't breathe." Has some R sided chest pain that is at his baseline from a fall 5 years ago, no palpitations. Does endorse some lightheadedness.  Has h/o alcohol abuse, states quit liquor years ago but continued to drink beer regularly because he does not consider it to be alcohol. States last drink was 1 week ago although he told the ED provider 3 days ago.    Review Of Systems: Per HPI with the following additions:   Review of Systems  Constitutional: Negative for chills and fever.  Respiratory: Positive for shortness of breath. Negative for wheezing.   Cardiovascular: Negative for chest pain and palpitations.  Gastrointestinal: Negative for abdominal pain, constipation, diarrhea, heartburn, nausea and vomiting.  Genitourinary: Negative for dysuria, frequency and urgency.  Musculoskeletal:       Chest wall pain  Neurological: Negative for dizziness and seizures.    Patient Active Problem List   Diagnosis Date Noted  . Hiatal hernia 09/14/2016  . Skin cancer 08/13/2016  .  Toe pain 07/07/2013  . Poor social situation 06/09/2013  . Tinea versicolor 06/08/2013  . Tachycardia 05/24/2013  . Lumbar compression fracture ([place]) 05/07/2013  . Trigger finger, acquired 11/18/2012  . 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 HTN (hypertension) 04/11/2012  . Alcohol abuse 09/16/2006  .  Chronic low back pain 09/16/2006    Past Medical History: Past Medical History:  Diagnosis Date  . Hydrocele    Surgically corrected  . Hypertension   . Lumbar compression fracture ([place])   . Skin cancer    exciced 2017    Past Surgical History: Past Surgical History:  Procedure Laterality Date  . HIATAL HERNIA REPAIR  2016  . HYDROCELE EXCISION / REPAIR  2010  . SKIN CANCER EXCISION Left    Excised 2017    Social History: Social History  Substance Use Topics  . Smoking status: Never Smoker  . Smokeless tobacco: Never Used  . Alcohol use 25.2 oz/week    42 Cans of beer per week     Comment: quit 04/2016   Additional social history: Drinks beer. Denies recreational drug use. Please also refer to relevant sections of EMR.  Family History: Family History  Problem Relation Age of Onset  . Heart attack Father     died at 58 years  . Dementia Father   . Stroke Father   . Cancer Sister     Allergies and Medications: No Known Allergies No current facility-administered medications on file prior to encounter.    Current Outpatient Prescriptions on File Prior to Encounter  Medication Sig Dispense Refill  . acetaminophen (TYLENOL) 325 MG tablet Take 650 mg by mouth every 6 (six) hours as needed for mild pain.     Marland Kitchen aspirin EC 325 MG tablet Take 325 mg by mouth at bedtime as needed for mild pain. For pain     . hydrochlorothiazide (HYDRODIURIL) 25 MG tablet TAKE 1 TABLET BY MOUTH EVERY DAY 30 tablet 0  . LORazepam (ATIVAN) 0.5 MG tablet Take 1 tablet (0.5 mg total) by mouth at bedtime as needed for anxiety. 30 tablet 1  . naproxen (NAPROSYN) 500 MG tablet Take 1 tablet (500 mg total) by mouth up to 2 (two) times daily with meals as needed for pain. Don't take this with Aspirn! 60 tablet 0  . traMADol (ULTRAM) 50 MG tablet take 1 tablet by mouth every 8 hours if needed (Patient taking differently: take 1 tablet by mouth every 8 hours if needed for pain) 45 tablet 1  .  citalopram (CELEXA) 20 MG tablet Take 1 tablet (20 mg total) by mouth daily. 30 tablet 3  . cyclobenzaprine (FLEXERIL) 5 MG tablet Take 1 tablet (5 mg total) by mouth 2 (two) times daily as needed for muscle spasms. 30 tablet 1  . cyclobenzaprine (FLEXERIL) 5 MG tablet take 1 tablet by mouth twice a day if needed for MUSCLE SPASMS 30 tablet 1  . ketoconazole (NIZORAL) 2 % cream Apply 1 application topically daily. X 14 days 30 g 1  . metoprolol succinate (TOPROL-XL) 25 MG 24 hr tablet Take 1 tablet (25 mg total) by mouth daily. 90 tablet 3  . omeprazole (PRILOSEC) 40 MG capsule Take 1 capsule (40 mg total) by mouth daily. 30 capsule 3    Objective: BP (!) 142/101   Pulse (!) 115   Temp 98.7 F (37.1 C) (Oral)   Resp 19   SpO2 99%  Exam: General: Sitting up  in bed, appears tremulous. In NAD Eyes: PERRL, EOMI, conjunctiva normal ENTM: MMM. Pharynx unremarkable Neck: supple. No lymphadenopathy. No JVD Cardiovascular: Tachycardic, regular, normal S1 and S2. No murmurs Respiratory: CTAB, normal effort on room air, speaking in full sentences Gastrointestinal: soft, nontender, nondistended, + bowel sounds MSK: normal ROM. No calf tenderness. Neg homans. Derm: warm and dry, no rashes. Areas of pink discoloration on face and head. Neuro: Alert. Oriented to person, place and time. Psych: Tangential speech. Incongruent affect.   Labs and Imaging:  CBC    Component Value Date/Time   WBC 10.1 11/01/2016 2332   RBC 3.90 (L) 11/01/2016 2332   HGB 13.4 11/01/2016 2332   HCT 38.1 (L) 11/01/2016 2332   PLT 122 (L) 11/01/2016 2332   MCV 97.7 11/01/2016 2332   MCH 34.4 (H) 11/01/2016 2332   MCHC 35.2 11/01/2016 2332   RDW 13.6 11/01/2016 2332   LYMPHSABS 1.3 11/01/2016 2332   MONOABS 0.5 11/01/2016 2332   EOSABS 0.0 11/01/2016 2332   BASOSABS 0.0 11/01/2016 2332   CMP     Component Value Date/Time   NA 130 (L) 11/01/2016 2332   K 3.1 (L) 11/01/2016 2332   CL 89 (L) 11/01/2016 2332    CO2 21 (L) 11/01/2016 2332   GLUCOSE 92 11/01/2016 2332   BUN 53 (H) 11/01/2016 2332   CREATININE 1.74 (H) 11/01/2016 2332   CREATININE 0.78 08/10/2013 1630   CALCIUM 9.5 11/01/2016 2332   PROT 7.3 11/01/2016 2332   ALBUMIN 4.1 11/01/2016 2332   AST 57 (H) 11/01/2016 2332   ALT 45 11/01/2016 2332   ALKPHOS 89 11/01/2016 2332   BILITOT 1.5 (H) 11/01/2016 2332   GFRNONAA 41 (L) 11/01/2016 2332   GFRAA 47 (L) 11/01/2016 2332   Lactic acid 1.3  TSH 3.349  EtOH <5  Dg Chest 2 View  Result Date: 11/01/2016 CLINICAL DATA:  Dyspnea EXAM: CHEST  2 VIEW COMPARISON:  08/05/2012 chest radiograph. FINDINGS: Surgical clips overlie the left upper abdomen. Stable cardiomediastinal silhouette with normal heart size. No pneumothorax. No pleural effusion. Lungs appear clear, with no acute consolidative airspace disease and no pulmonary edema. IMPRESSION: No active cardiopulmonary disease. Electronically Signed   By: Ilona Sorrel M.D.   On: 11/01/2016 23:58   Ct Angio Chest Pe W And/or Wo Contrast  Result Date: 11/02/2016 CLINICAL DATA:  Shortness of breath. Tachycardia. Elevated D-dimer. Chest tightness for a very long time. EXAM: CT ANGIOGRAPHY CHEST WITH CONTRAST TECHNIQUE: Multidetector CT imaging of the chest was performed using the standard protocol during bolus administration of intravenous contrast. Multiplanar CT image reconstructions and MIPs were obtained to evaluate the vascular anatomy. CONTRAST:  80 mL Isovue 370 COMPARISON:  None. FINDINGS: Cardiovascular: Focal filling defects are demonstrated in the distal right lobar pulmonary artery with extension into right upper and lower lobe branches consistent with acute pulmonary embolus. RV to LV ratio is elevated at 1.965. Mild cardiac enlargement. Normal caliber thoracic aorta. No aortic dissection. Mediastinum/Nodes: No enlarged mediastinal, hilar, or axillary lymph nodes. Thyroid gland, trachea, and esophagus demonstrate no significant findings.  Lungs/Pleura: Calcified granuloma in the right lung. Mild dependent changes in the lung bases. No focal airspace disease or consolidation. No pleural effusions. No pneumothorax. Upper Abdomen: Diffuse fatty infiltration of the liver. Probable left renal cysts. Postoperative changes at the EG junction consistent with fundoplication. Musculoskeletal: Degenerative changes in spine. Old rib fractures. No destructive bone lesions. Review of the MIP images confirms the above findings. IMPRESSION: Positive for acute  PE with CT evidence of right heart strain (RV/LV Ratio = 1.965) consistent with at least submassive (intermediate risk) PE. The presence of right heart strain has been associated with an increased risk of morbidity and mortality. Please activate Code PE by paging [phone]. These results were called by telephone at the time of interpretation on 11/02/2016 at 1:04 am to [place] , who verbally acknowledged these results. Electronically Signed   By: Lucienne Capers M.D.   On: 11/02/2016 01:07    Bufford Lope, DO 11/02/2016, 1:55 AM PGY-1, [facility] pager: [phone], text pages welcome   Upper Level Addendum:  I have seen and evaluated this patient along with Dr. Shawna Orleans and reviewed the above note, making necessary revisions in red.   Elberta Leatherwood, MD,MS,  PGY3 11/02/2016 7:16 AM

## 2016-11-02 NOTE — Progress Notes (Signed)
Pt arrived from ED having pulled out IV during transport. Pt being combative and refusing to allow staff to replace IV. Pt presently requiring six staff members in room to maintain safety.  Contacted MD for orders for bilateral wrist restraints so that IV may be restarted and safety maintained. Pt has already received Ativan in ED.  Orders received for bilateral wrist restraints.

## 2016-11-02 NOTE — Progress Notes (Signed)
ANTICOAGULATION CONSULT NOTE - Initial Consult  Pharmacy Consult for Heparin Indication: pulmonary embolus  No Known Allergies  Patient Measurements:    Vital Signs: Temp: 98.7 F (37.1 C) (04/15 2253) Temp Source: Oral (04/15 2253) BP: 138/117 (04/16 0100) Pulse Rate: 114 (04/16 0100)  Labs:  Recent Labs  11/01/16 2332  HGB 13.4  HCT 38.1*  PLT 122*  CREATININE 1.74*    CrCl cannot be calculated (Unknown ideal weight.).   Medical History: Past Medical History:  Diagnosis Date  . Hydrocele    Surgically corrected  . Hypertension   . Lumbar compression fracture ([place])   . Skin cancer    exciced 2017    Medications:  No current facility-administered medications on file prior to encounter.    Current Outpatient Prescriptions on File Prior to Encounter  Medication Sig Dispense Refill  . acetaminophen (TYLENOL) 325 MG tablet Take 650 mg by mouth every 6 (six) hours as needed for mild pain.     Marland Kitchen aspirin EC 325 MG tablet Take 325 mg by mouth at bedtime as needed for mild pain. For pain     . hydrochlorothiazide (HYDRODIURIL) 25 MG tablet TAKE 1 TABLET BY MOUTH EVERY DAY 30 tablet 0  . LORazepam (ATIVAN) 0.5 MG tablet Take 1 tablet (0.5 mg total) by mouth at bedtime as needed for anxiety. 30 tablet 1  . naproxen (NAPROSYN) 500 MG tablet Take 1 tablet (500 mg total) by mouth up to 2 (two) times daily with meals as needed for pain. Don't take this with Aspirn! 60 tablet 0  . traMADol (ULTRAM) 50 MG tablet take 1 tablet by mouth every 8 hours if needed (Patient taking differently: take 1 tablet by mouth every 8 hours if needed for pain) 45 tablet 1  . citalopram (CELEXA) 20 MG tablet Take 1 tablet (20 mg total) by mouth daily. 30 tablet 3  . cyclobenzaprine (FLEXERIL) 5 MG tablet Take 1 tablet (5 mg total) by mouth 2 (two) times daily as needed for muscle spasms. 30 tablet 1  . cyclobenzaprine (FLEXERIL) 5 MG tablet take 1 tablet by mouth twice a day if needed for MUSCLE  SPASMS 30 tablet 1  . ketoconazole (NIZORAL) 2 % cream Apply 1 application topically daily. X 14 days 30 g 1  . metoprolol succinate (TOPROL-XL) 25 MG 24 hr tablet Take 1 tablet (25 mg total) by mouth daily. 90 tablet 3  . omeprazole (PRILOSEC) 40 MG capsule Take 1 capsule (40 mg total) by mouth daily. 30 capsule 3     Assessment: 61 y.o. male with PE for heparin  Goal of Therapy:  Heparin level 0.3-0.7 units/ml Monitor platelets by anticoagulation protocol: Yes   Plan:  Heparin 4000 units IV bolus, then start heparin 1400 units/hr Check heparin level in 6 hours.   Caryl Pina 11/02/2016,1:14 AM

## 2016-11-02 NOTE — Progress Notes (Signed)
Attempted to call brother who is listed in the computer to obtain medical information regarding patient. Only number listed and left message will try again later this afternoon. Modena Morrow E, RN 11/02/2016 1008

## 2016-11-02 NOTE — Progress Notes (Signed)
ANTICOAGULATION CONSULT NOTE - Follow up [facility] for Heparin Indication: pulmonary embolus with R heart strain  No Known Allergies  Patient Measurements: Height: 6' (182.9 cm) Weight: 171 lb 4.8 oz (77.7 kg) IBW/kg (Calculated) : 77.6  Vital Signs: Temp: 98 F (36.7 C) (04/16 1604) Temp Source: Oral (04/16 1604) BP: 110/44 (04/16 1500) Pulse Rate: 68 (04/16 1500)  Labs:  Recent Labs  11/01/16 2332 11/02/16 0410 11/02/16 0448 11/02/16 0822 11/02/16 1122 11/02/16 1512  HGB 13.4  --   --   --   --   --   HCT 38.1*  --   --   --   --   --   PLT 122*  --   --   --   --   --   HEPARINUNFRC  --   --   --  0.45  --  1.06*  CREATININE 1.74*  --  1.27* 1.23  --   --   TROPONINI  --  0.03*  --  <0.03 0.03*  --     Estimated Creatinine Clearance: 69.2 mL/min (by C-G formula based on SCr of 1.23 mg/dL).   Medications (infusion):  . dexmedetomidine 0.5 mcg/kg/hr (11/02/16 1625)  . dextrose 5 % and 0.9% NaCl 125 mL/hr at 11/02/16 1630  . heparin 1,400 Units/hr (11/02/16 1400)    Assessment: 61 y.o. male with PE with R heart strain currently treated with heparin drip. No AC PTA. Heparin level rose from 0.45 >> 1.06 (supratherapeutic) with no change of line or problems with infusion. Heparin level drawn from arm with no heparin infusing. Spoke with RN who confirmed this info. No s/sx bleeding noted.   Goal of Therapy:  Heparin level 0.3-0.7 units/ml Monitor platelets by anticoagulation protocol: Yes   Plan:  Hold heparin drip for 30 min (conservative with R heart strain) Decrease heparin drip to 1200 units/hr 6 hr heparin level  Daily CBC, heparin level Monitor for s/sx of bleeding   Renold Genta, PharmD, BCPS Clinical Pharmacist Phone for tonight - [place] - [phone] 11/02/2016 4:24 PM

## 2016-11-02 NOTE — Progress Notes (Signed)
ANTICOAGULATION CONSULT NOTE - Follow up [place] [facility] for Heparin Indication: pulmonary embolus with R heart strain  No Known Allergies  Patient Measurements: Height: 6' (182.9 cm) Weight: 171 lb 4.8 oz (77.7 kg) IBW/kg (Calculated) : 77.6  Vital Signs: Temp: 99.1 F (37.3 C) (04/16 0838) Temp Source: Oral (04/16 0838) BP: 152/93 (04/16 0745) Pulse Rate: 130 (04/16 0808)  Labs:  Recent Labs  11/01/16 2332 11/02/16 0410 11/02/16 0448 11/02/16 0822  HGB 13.4  --   --   --   HCT 38.1*  --   --   --   PLT 122*  --   --   --   HEPARINUNFRC  --   --   --  0.45  CREATININE 1.74*  --  1.27* 1.23  TROPONINI  --  0.03*  --  <0.03    Estimated Creatinine Clearance: 69.2 mL/min (by C-G formula based on SCr of 1.23 mg/dL).   Medical History: Past Medical History:  Diagnosis Date  . Hydrocele    Surgically corrected  . Hypertension   . Lumbar compression fracture ([place])   . Skin cancer    exciced 2017    Medications:  No current facility-administered medications on file prior to encounter.    Current Outpatient Prescriptions on File Prior to Encounter  Medication Sig Dispense Refill  . acetaminophen (TYLENOL) 325 MG tablet Take 650 mg by mouth every 6 (six) hours as needed for mild pain.     Marland Kitchen aspirin EC 325 MG tablet Take 325 mg by mouth at bedtime as needed for mild pain. For pain     . hydrochlorothiazide (HYDRODIURIL) 25 MG tablet TAKE 1 TABLET BY MOUTH EVERY DAY 30 tablet 0  . LORazepam (ATIVAN) 0.5 MG tablet Take 1 tablet (0.5 mg total) by mouth at bedtime as needed for anxiety. 30 tablet 1  . naproxen (NAPROSYN) 500 MG tablet Take 1 tablet (500 mg total) by mouth up to 2 (two) times daily with meals as needed for pain. Don't take this with Aspirn! 60 tablet 0  . traMADol (ULTRAM) 50 MG tablet take 1 tablet by mouth every 8 hours if needed (Patient taking differently: take 1 tablet by mouth every 8 hours if needed for pain) 45 tablet 1  . citalopram  (CELEXA) 20 MG tablet Take 1 tablet (20 mg total) by mouth daily. 30 tablet 3  . cyclobenzaprine (FLEXERIL) 5 MG tablet Take 1 tablet (5 mg total) by mouth 2 (two) times daily as needed for muscle spasms. 30 tablet 1  . cyclobenzaprine (FLEXERIL) 5 MG tablet take 1 tablet by mouth twice a day if needed for MUSCLE SPASMS 30 tablet 1  . ketoconazole (NIZORAL) 2 % cream Apply 1 application topically daily. X 14 days 30 g 1  . metoprolol succinate (TOPROL-XL) 25 MG 24 hr tablet Take 1 tablet (25 mg total) by mouth daily. 90 tablet 3  . omeprazole (PRILOSEC) 40 MG capsule Take 1 capsule (40 mg total) by mouth daily. 30 capsule 3     Assessment: 61 y.o. male with PE with R heart strain currently treated with heparin. No AC PTA. 6Hr heparin level therapeutic x1. No s/sx bleeding noted. CBC stable.   Goal of Therapy:  Heparin level 0.3-0.7 units/ml Monitor platelets by anticoagulation protocol: Yes   Plan:  Continue heparin 1400 units/hr Check confirmatory heparin level in 6 hours.  Daily CBC, HL, s/sx bleeding  Carlean Jews, Pharm.D. PGY1 Pharmacy Resident 4/16/20189:41 AM Pager [phone]

## 2016-11-02 NOTE — Progress Notes (Signed)
Hypoglycemic Event  CBG: 65  Treatment: D50 IV 25 mL  Symptoms: Pale and Sweaty  Follow-up CBG: [id number] CBG Result:123  Possible Reasons for Event: Inadequate meal intake  Comments/MD notified:Yes    Abdiaziz Klahn E

## 2016-11-02 NOTE — ED Notes (Signed)
Staffing notified of sitter need, EMT at bedside speaking with patient. Patient continues to pull off leads and pulse ox

## 2016-11-02 NOTE — Progress Notes (Signed)
Pt disoriented x4 with hallucinations, pt now in 4-point restraints and is still very combative, pt refuses to calm down while lying in the bed, pt ordered heparin unable to be continued due to loss of 2nd IV site placed on arrival to unit, CIWA of 54, MD notified of pt's behavior and CIWA score, MD paged and is currently at bedside, RN will initiate upcoming orders and continue to monitor pt, call bell within reach, bed in lowest position with bed alarm in use.   Izola Price, RN 11/02/2016

## 2016-11-02 NOTE — Significant Event (Signed)
Rapid Response Event Note  Overview: Time Called: 0701 Arrival Time: 0715 Event Type: Neurologic  Initial Focused Assessment: Patient in active DTs,  Constantly talking and restless in the bed but not interacting with staff. 152/93 HR 139  RR  24 O2 sat 98%  Interventions: Orders received to transfer to ICU Patient transferred to [facility] gtt  Plan of Care (if not transferred):  Event Summary: Name of Physician Notified: Resident at bedside upon my arrival at    Name of Consulting Physician Notified: Dr Sood/CCM at [facility]: Transferred (Comment)     Raliegh Ip

## 2016-11-02 NOTE — Progress Notes (Signed)
FPTS Interim Progress Note  S: Paged by RN that CIWA score 37 and that patient continues to be agitated. Patient is currently laying in bed, eyes closed, thrashing about and confused with incoherent speech.  O: BP (!) 167/116   Pulse (!) 157   Temp 98.1 F (36.7 C) (Axillary)   Resp 20   Wt 81.7 kg (180 lb 1.9 oz)   SpO2 99%   BMI 26.60 kg/m   General: Laying in bed, arms extended, thrashing and talking incoherently. Appears very diaphoretic  A/P: EtOH withdrawal with CIWA scores persistently over 20 despite now having received 6mg  Ativan since admission. - Ativan 4mg  IV now - CCM to see to evaluate for precedex drip and ICU transfer, appreciate recommendations.  Bufford Lope, DO 11/02/2016, 7:06 AM PGY-1, [facility] pager [phone]

## 2016-11-02 NOTE — Consult Note (Signed)
PCCM Consult Note  Admission date: 11/01/2016  CC: Altered mental status  HPI: Consulted by Dr. Shawna Orleans with [facility] on 11/02/16 for assessment of altered mental status. Pt not able to provide history.    He presented with dyspnea for about 2 weeks with chest discomfort.  He was found to have acute PE and started on heparin gtt.  He has hx of ETOH and last drink was from 3 to 7 days ago.  He developed progressive delirium with hallucinations.  He didn't improve with ativan.  He  has a past medical history of Hydrocele; Hypertension; Lumbar compression fracture ([place]); and Skin cancer.  He  has a past surgical history that includes Hydrocele excision / repair (2010); Hiatal hernia repair (2016); and Skin cancer excision ([place]).  His family history includes Cancer in his sister; Dementia in his father; Heart attack in his father; Stroke in his father.  He  reports that he has never smoked. He has never used smokeless tobacco. He reports that he drinks about 25.2 oz of alcohol per week . He reports that he uses drugs, including Marijuana.  No Known Allergies  No current facility-administered medications on file prior to encounter.    Current Outpatient Prescriptions on File Prior to Encounter  Medication Sig  . acetaminophen (TYLENOL) 325 MG tablet Take 650 mg by mouth every 6 (six) hours as needed for mild pain.   Marland Kitchen aspirin EC 325 MG tablet Take 325 mg by mouth at bedtime as needed for mild pain. For pain   . hydrochlorothiazide (HYDRODIURIL) 25 MG tablet TAKE 1 TABLET BY MOUTH EVERY DAY  . LORazepam (ATIVAN) 0.5 MG tablet Take 1 tablet (0.5 mg total) by mouth at bedtime as needed for anxiety.  . naproxen (NAPROSYN) 500 MG tablet Take 1 tablet (500 mg total) by mouth up to 2 (two) times daily with meals as needed for pain. Don't take this with Aspirn!  . traMADol (ULTRAM) 50 MG tablet take 1 tablet by mouth every 8 hours if needed (Patient taking differently: take 1 tablet by mouth every 8 hours if  needed for pain)  . citalopram (CELEXA) 20 MG tablet Take 1 tablet (20 mg total) by mouth daily.  . cyclobenzaprine (FLEXERIL) 5 MG tablet Take 1 tablet (5 mg total) by mouth 2 (two) times daily as needed for muscle spasms.  . cyclobenzaprine (FLEXERIL) 5 MG tablet take 1 tablet by mouth twice a day if needed for MUSCLE SPASMS  . ketoconazole (NIZORAL) 2 % cream Apply 1 application topically daily. X 14 days  . metoprolol succinate (TOPROL-XL) 25 MG 24 hr tablet Take 1 tablet (25 mg total) by mouth daily.  Marland Kitchen omeprazole (PRILOSEC) 40 MG capsule Take 1 capsule (40 mg total) by mouth daily.   ROS: Unable to obtain  Vital signs: BP (!) 167/116   Pulse (!) 157   Temp 98.1 F (36.7 C) (Axillary)   Resp 20   Wt 180 lb 1.9 oz (81.7 kg)   SpO2 99%   BMI 26.60 kg/m   Intake/output: I/O last 3 completed shifts: In: 2000 [IV Piggyback:2000] Out: -   General: confused Neuro: tremulous, wiggling in bed HEENT: Pupils dilated and reactive, no stridor, no LAN Cardiac: regular, tachycardic Chest: no wheeze Abd: soft, non tender Ext: no edema Skin: no rashes   CMP Latest Ref Rng & Units 11/02/2016 11/01/2016 08/10/2013  Glucose 65 - 99 mg/dL 75 92 82  BUN 6 - 20 mg/dL 44(H) 53(H) 13  Creatinine 0.61 -  1.24 mg/dL 1.27(H) 1.74(H) 0.78  Sodium 135 - 145 mmol/L 131(L) 130(L) 139  Potassium 3.5 - 5.1 mmol/L 3.3(L) 3.1(L) 4.5  Chloride 101 - 111 mmol/L 94(L) 89(L) 102  CO2 22 - 32 mmol/L 21(L) 21(L) 24  Calcium 8.9 - 10.3 mg/dL 8.4(L) 9.5 8.9  Total Protein 6.5 - 8.1 g/dL - 7.3 7.1  Total Bilirubin 0.3 - 1.2 mg/dL - 1.5(H) 0.6  Alkaline Phos 38 - 126 U/L - 89 76  AST 15 - 41 U/L - 57(H) 75(H)  ALT 17 - 63 U/L - 45 55(H)     CBC Latest Ref Rng & Units 11/01/2016 08/10/2013 05/23/2013  WBC 4.0 - 10.5 K/uL 10.1 4.7 5.4  Hemoglobin 13.0 - 17.0 g/dL 13.4 13.7 13.6  Hematocrit 39.0 - 52.0 % 38.1(L) 39.8 38.3(L)  Platelets 150 - 400 K/uL 122(L) 249 435(H)     ABG    Component Value  Date/Time   HCO3 24.5 (H) 04/26/2007 1718   TCO2 26 04/26/2007 1718     CBG (last 3)   Recent Labs  11/02/16 0546  GLUCAP 75     Imaging: Dg Chest 2 View  Result Date: 11/01/2016 CLINICAL DATA:  Dyspnea EXAM: CHEST  2 VIEW COMPARISON:  08/05/2012 chest radiograph. FINDINGS: Surgical clips overlie the left upper abdomen. Stable cardiomediastinal silhouette with normal heart size. No pneumothorax. No pleural effusion. Lungs appear clear, with no acute consolidative airspace disease and no pulmonary edema. IMPRESSION: No active cardiopulmonary disease. Electronically Signed   By: Ilona Sorrel M.D.   On: 11/01/2016 23:58   Ct Angio Chest Pe W And/or Wo Contrast  Result Date: 11/02/2016 CLINICAL DATA:  Shortness of breath. Tachycardia. Elevated D-dimer. Chest tightness for a very long time. EXAM: CT ANGIOGRAPHY CHEST WITH CONTRAST TECHNIQUE: Multidetector CT imaging of the chest was performed using the standard protocol during bolus administration of intravenous contrast. Multiplanar CT image reconstructions and MIPs were obtained to evaluate the vascular anatomy. CONTRAST:  80 mL Isovue 370 COMPARISON:  None. FINDINGS: Cardiovascular: Focal filling defects are demonstrated in the distal right lobar pulmonary artery with extension into right upper and lower lobe branches consistent with acute pulmonary embolus. RV to LV ratio is elevated at 1.965. Mild cardiac enlargement. Normal caliber thoracic aorta. No aortic dissection. Mediastinum/Nodes: No enlarged mediastinal, hilar, or axillary lymph nodes. Thyroid gland, trachea, and esophagus demonstrate no significant findings. Lungs/Pleura: Calcified granuloma in the right lung. Mild dependent changes in the lung bases. No focal airspace disease or consolidation. No pleural effusions. No pneumothorax. Upper Abdomen: Diffuse fatty infiltration of the liver. Probable left renal cysts. Postoperative changes at the EG junction consistent with  fundoplication. Musculoskeletal: Degenerative changes in spine. Old rib fractures. No destructive bone lesions. Review of the MIP images confirms the above findings. IMPRESSION: Positive for acute PE with CT evidence of right heart strain (RV/LV Ratio = 1.965) consistent with at least submassive (intermediate risk) PE. The presence of right heart strain has been associated with an increased risk of morbidity and mortality. Please activate Code PE by paging [phone]. These results were called by telephone at the time of interpretation on 11/02/2016 at 1:04 am to [place] , who verbally acknowledged these results. Electronically Signed   By: Lucienne Capers M.D.   On: 11/02/2016 01:07     Studies: CT angio chest 4/16 >> PE with RV:LV ratio 1.96  Events: 4/16 Admit, start heparin gtt, transfer to ICU for precedex  Summary: 61 yo male with acute  PE developed DT's.  Assessment/plan:  Acute pulmonary embolism with RV strain. - not a candidate for thrombolytic therapy in setting of ETOH with DTs - continue heparin gtt - f/u Echo - continue IV fluids  Delirium tremens. - change to precedex with RASS goal 0 to -1 - thiamine, folic acid - dextrose in IV fluids  Acute renal failure >> baseline creatinine 0.78 from 08/10/13. High anion gap metabolic acidosis. - repeat BMET - check serum osmolarity, ABG - continue IV fluids  Hx of HTN. - monitor hemodynamics  DVT prophylaxis - heparin gtt SUP - protonix Nutrition - NPO Goals of care - full code  CC time 32 minutes  Chesley Mires, MD [facility] 11/02/2016, 7:31 AM Pager:  [phone] After 3pm call: [phone]

## 2016-11-03 ENCOUNTER — Inpatient Hospital Stay (HOSPITAL_COMMUNITY): Payer: Medicaid Other

## 2016-11-03 LAB — COMPREHENSIVE METABOLIC PANEL
ALBUMIN: 3.1 g/dL — AB (ref 3.5–5.0)
ALK PHOS: 68 U/L (ref 38–126)
ALT: 36 U/L (ref 17–63)
AST: 70 U/L — ABNORMAL HIGH (ref 15–41)
Anion gap: 7 (ref 5–15)
BUN: 20 mg/dL (ref 6–20)
CALCIUM: 8.1 mg/dL — AB (ref 8.9–10.3)
CO2: 21 mmol/L — AB (ref 22–32)
CREATININE: 0.62 mg/dL (ref 0.61–1.24)
Chloride: 107 mmol/L (ref 101–111)
GFR calc non Af Amer: >60 mL/min (ref >60)
Glucose, Bld: 111 mg/dL — ABNORMAL HIGH (ref 65–99)
Potassium: 2.9 mmol/L — ABNORMAL LOW (ref 3.5–5.1)
SODIUM: 135 mmol/L (ref 135–145)
Total Bilirubin: 1.1 mg/dL (ref 0.3–1.2)
Total Protein: 6.1 g/dL — ABNORMAL LOW (ref 6.5–8.1)

## 2016-11-03 LAB — CBC
HCT: 32.8 % — ABNORMAL LOW (ref 39.0–52.0)
HEMOGLOBIN: 11.5 g/dL — AB (ref 13.0–17.0)
MCH: 34.7 pg — AB (ref 26.0–34.0)
MCHC: 35.1 g/dL (ref 30.0–36.0)
MCV: 99.1 fL (ref 78.0–100.0)
PLATELETS: 91 10*3/uL — AB (ref 150–400)
RBC: 3.31 MIL/uL — ABNORMAL LOW (ref 4.22–5.81)
RDW: 13.5 % (ref 11.5–15.5)
WBC: 4.9 10*3/uL (ref 4.0–10.5)

## 2016-11-03 LAB — GLUCOSE, CAPILLARY
GLUCOSE-CAPILLARY: 98 mg/dL (ref 65–99)
Glucose-Capillary: 109 mg/dL — ABNORMAL HIGH (ref 65–99)
Glucose-Capillary: 125 mg/dL — ABNORMAL HIGH (ref 65–99)
Glucose-Capillary: 66 mg/dL (ref 65–99)
Glucose-Capillary: 66 mg/dL (ref 65–99)
Glucose-Capillary: 70 mg/dL (ref 65–99)
Glucose-Capillary: 77 mg/dL (ref 65–99)
Glucose-Capillary: 87 mg/dL (ref 65–99)

## 2016-11-03 LAB — PROTIME-INR
INR: 1.09
Prothrombin Time: 14.2 seconds (ref 11.4–15.2)

## 2016-11-03 LAB — HEPARIN LEVEL (UNFRACTIONATED)
HEPARIN UNFRACTIONATED: 0.37 [IU]/mL (ref 0.30–0.70)
Heparin Unfractionated: 0.71 IU/mL — ABNORMAL HIGH (ref 0.30–0.70)
Heparin Unfractionated: 1 IU/mL — ABNORMAL HIGH (ref 0.30–0.70)

## 2016-11-03 LAB — HEMOGLOBIN A1C
HEMOGLOBIN A1C: 5.2 % (ref 4.8–5.6)
Mean Plasma Glucose: 103 mg/dL

## 2016-11-03 LAB — SEDIMENTATION RATE: SED RATE: 23 mm/h — AB (ref 0–16)

## 2016-11-03 MED ORDER — CLONIDINE HCL 0.1 MG PO TABS
0.1000 mg | ORAL_TABLET | Freq: Two times a day (BID) | ORAL | Status: DC
  Administered 2016-11-03 – 2016-11-04 (×3): 0.1 mg via ORAL
  Filled 2016-11-03 (×3): qty 1

## 2016-11-03 MED ORDER — SODIUM CHLORIDE 0.9 % IV SOLN
30.0000 meq | INTRAVENOUS | Status: AC
  Administered 2016-11-03 (×2): 30 meq via INTRAVENOUS
  Filled 2016-11-03 (×2): qty 15

## 2016-11-03 MED ORDER — LORAZEPAM 2 MG/ML IJ SOLN
1.0000 mg | Freq: Three times a day (TID) | INTRAMUSCULAR | Status: DC
  Administered 2016-11-03 – 2016-11-04 (×3): 1 mg via INTRAVENOUS
  Filled 2016-11-03 (×4): qty 1

## 2016-11-03 NOTE — Progress Notes (Signed)
FPTS Social Note  Socially rounded on patient. Appreciate excellent care being provided by CCM. Will resume care when patient is stable for transfer to floor.   Bufford Lope, DO 11/03/2016, 6:57 AM PGY-1, [facility] Service pager [phone]

## 2016-11-03 NOTE — Progress Notes (Signed)
Hypoglycemic Event  CBG: 66  Treatment: 15 GM carbohydrate snack  Symptoms: None  Follow-up CBG: Time:1240 CBG Result:77  Possible Reasons for Event: Inadequate meal intake  Comments/MD notified: E-link notified.    Bee Marchiano Pearline Cables

## 2016-11-03 NOTE — Progress Notes (Addendum)
PULMONARY  / CRITICAL CARE MEDICINE  Name: Anthony [name] MRN: [id number] DOB: 1955-12-29    LOS: 1  REFERRING MD : Dr. Andria Frames   CHIEF COMPLAINT: shortness of breath, chest tightness, tachycardia   BRIEF PATIENT DESCRIPTION:  61 yo M with PMH HTN, EtOH abuse, hx of skin CA, chronic back pain presented 4/16 with shortness of breath, chest tightness, tachycardia with HR 120s. Vital signs were stable and he had no signs of fluid overload. elevated D- Dimer, CT angio chest revealed acute PE, EKG showed sinus tachycardia minimal ST depression , trop negative. At 6 pm 4/16 was found to be disorientedx4 with hallucinations, CIWA 37 and was transferred to the ICU for precedex gtt.   LINES / TUBES: Urethral cath 4/16 >>  Peripheral IVs 4/16 >>   CULTURES: MRSA positive   ANTIBIOTICS: None   SIGNIFICANT EVENTS:  4/16 admitted  LEVEL OF CARE:  Critical care  PRIMARY SERVICE:  PCCM CONSULTANTS:  CODE STATUS FULL  DIET:  NPO DVT Px: heparin  GI Px:  protonix   REVIEW OF SYSTEMS:  Denies chest or abdominal pain, no shortness of breath.    INTERVAL HISTORY:   VITAL SIGNS: Temp:  [97.1 F (36.2 C)-98.9 F (37.2 C)] 98.9 F (37.2 C) (04/17 0830) Pulse Rate:  [40-84] 56 (04/17 0900) Resp:  [12-30] 13 (04/17 0900) BP: (70-144)/(44-91) 141/78 (04/17 0900) SpO2:  [92 %-100 %] 98 % (04/17 0900) HEMODYNAMICS:   VENTILATOR SETTINGS:   INTAKE / OUTPUT: Intake/Output      04/16 0701 - 04/17 0700 04/17 0701 - 04/18 0700   I.V. (mL/kg) 3528.1 (45.4) 55 (0.7)   IV Piggyback 529.9    Total Intake(mL/kg) 4058 (52.2) 55 (0.7)   Urine (mL/kg/hr) 2140 (1.1) 125 (0.7)   Stool 0 (0)    Total Output 2140 125   Net +1918 -70        Stool Occurrence 1 x      PHYSICAL EXAMINATION: General: NAD, well nourished  Neuro:  AAOx3, moving extremities  HEENT:  MMM, poor dentition  Cardiovascular:  No murmu, No calf swelling or tenderness  Lungs: clear to auscultation b/l  Abdomen:  BS+, non  tender, non distended Musculoskeletal: normal tone  Skin:  Intact, warm and dry    LABS: Cbc  Recent Labs Lab 11/01/16 2332 11/03/16 0254  WBC 10.1 4.9  HGB 13.4 11.5*  HCT 38.1* 32.8*  PLT 122* 91*    Chemistry   Recent Labs Lab 11/02/16 0448 11/02/16 0822 11/03/16 0254  NA 131* 135 135  K 3.3* 3.3* 2.9*  CL 94* 100* 107  CO2 21* 18* 21*  BUN 44* 47* 20  CREATININE 1.27* 1.23 0.62  CALCIUM 8.4* 8.4* 8.1*  PHOS 4.8*  --   --   GLUCOSE 75 62* 111*    Liver fxn  Recent Labs Lab 11/01/16 2332 11/02/16 0448 11/03/16 0254  AST 57*  --  70*  ALT 45  --  36  ALKPHOS 89  --  68  BILITOT 1.5*  --  1.1  PROT 7.3  --  6.1*  ALBUMIN 4.1 3.5 3.1*   coags No results for input(s): APTT, INR in the last 168 hours. Sepsis markers  Recent Labs Lab 11/02/16 0222 11/02/16 0448  LATICACIDVEN 1.3 1.1   Cardiac markers  Recent Labs Lab 11/02/16 0410 11/02/16 0822 11/02/16 1122  TROPONINI 0.03* <0.03 0.03*   BNP No results for input(s): PROBNP in the last 168 hours. ABG  Recent  Labs Lab 11/02/16 0829  PHART 7.405  PCO2ART 25.6*  PO2ART 79.0*  HCO3 16.1*  TCO2 17    CBG trend  Recent Labs Lab 11/02/16 1602 11/02/16 1958 11/03/16 0015 11/03/16 0347 11/03/16 0820  GLUCAP 90 132* 125* 109* 98    IMAGING:  ECG:  DIAGNOSES: Active Problems:   Right pulmonary embolus (HCC)   ASSESSMENT / PLAN:  PULMONARY  ASSESSMENT: Acute submassive pulmonary embolus  PLAN:   On heparin gtt   CARDIOVASCULAR  ASSESSMENT:  stable s/p 2.5 L NS bolus  PLAN:  On D5 NS at 125 ml/hr Labetalol PRN SBP >160 Cardiac monitor  RENAL  ASSESSMENT:   Hypokalemia, repleating s/p 2.5 L NS bolus PLAN:   On D5 NS at 125 ml/hr  GASTROINTESTINAL  ASSESSMENT:   Hx of alcohol abuse  PLAN:   Folic acid  MVM  thiamine Protonix 40 mg qd   HEMATOLOGIC  ASSESSMENT:   Acute submassive pulmonary embolus PLAN:  subq heparin  Heparin  gtt  INFECTIOUS  ASSESSMENT:   No acute issues  PLAN:   Monitor   ENDOCRINE  ASSESSMENT:   Hypoglycemia    PLAN:   On D5 NS gtt CBG q4h   NEUROLOGIC  ASSESSMENT:   Agitated  Alcohol withdrawal  PLAN:   Precedex gtt Ativan PRN  Neuro checks RASS 0--1  Safety restraints   CLINICAL SUMMARY:  precedex  Pulmonary and Critical Care Medicine [place] Pager: [phone]  11/03/2016, 9:17 AM   STAFF NOTE: I, Merrie Roof, MD FACP have personally reviewed patient's available data, including medical history, events of note, physical examination and test results as part of my evaluation. I have discussed with resident/NP and other care providers such as pharmacist, RN and RRT. In addition, I personally evaluated patient and elicited key findings of: awake, follows commands, int lethargy, lungs clear, no distress, not tachy, normal BP, I am also in agreement with Dr Halford Chessman, PE is not impressive and likely IS NOT causing the ratio, I am more concerned of volume status and pa htn causing ration, continued hep, NO ROLE TPA of any kind, would even consider roal agent now but I am concerned about fall risk for nonreversible agents and coumadin with etoh, , precedex is on going, add scheduled ativan to dc precedex, mobilize when doppler noted without clot and mobile clot, echo needed for pa pressures, get homocysteine level, ana, esr, assess cancer screening done, consider US liver, get baseline INR and PT, albumin reassuring, may be able to use coumadin The patient is critically ill with multiple organ systems failure and requires high complexity decision making for assessment and support, frequent evaluation and titration of therapies, application of advanced monitoring technologies and extensive interpretation of multiple databases.   Critical Care Time devoted to patient care services described in this note is35 Minutes. This time reflects time of care of this signee: Merrie Roof, MD FACP. This critical care time does not reflect procedure time, or teaching time or supervisory time of PA/NP/Med student/Med Resident etc but could involve care discussion time. Rest per NP/medical resident whose note is outlined above and that I agree with   Lavon Paganini. Titus Mould, MD, [place] Pgr: [facility] 11/03/2016 10:21 AM

## 2016-11-03 NOTE — Progress Notes (Signed)
[facility] for Heparin Indication: pulmonary embolus  No Known Allergies  Patient Measurements: Height: 6' (182.9 cm) Weight: 171 lb 4.8 oz (77.7 kg) IBW/kg (Calculated) : 77.6  Vital Signs: Temp: 98.6 F (37 C) (04/17 2339) Temp Source: Oral (04/17 2339) BP: 132/93 (04/17 2300) Pulse Rate: 125 (04/17 2300)  Labs:  Recent Labs  11/01/16 2332 11/02/16 0410 11/02/16 0448 11/02/16 0822 11/02/16 1122  11/03/16 0254 11/03/16 1040 11/03/16 1627 11/03/16 2215  HGB 13.4  --   --   --   --   --  11.5*  --   --   --   HCT 38.1*  --   --   --   --   --  32.8*  --   --   --   PLT 122*  --   --   --   --   --  91*  --   --   --   LABPROT  --   --   --   --   --   --   --  14.2  --   --   INR  --   --   --   --   --   --   --  1.09  --   --   HEPARINUNFRC  --   --   --  0.45  --   < >  --  0.71* 0.37 <0.10*  CREATININE 1.74*  --  1.27* 1.23  --   --  0.62  --   --   --   TROPONINI  --  0.03*  --  <0.03 0.03*  --   --   --   --   --   < > = values in this interval not displayed.  Estimated Creatinine Clearance: 106.4 mL/min (by C-G formula based on SCr of 0.62 mg/dL).   Assessment: 61 y.o. male with PE for heparin.  Heparin was off for ~ 2 hours this evening due to IV site infiltration and while a new site was obtained.  Goal of Therapy:  Heparin level 0.3-0.7 units/ml Monitor platelets by anticoagulation protocol: Yes   Plan:  Heparin 3000 units IV bolus, then continue heparin 1000 units/hr Check heparin level in 6 hours.     Caryl Pina 11/03/2016,11:57 PM

## 2016-11-03 NOTE — Progress Notes (Signed)
ANTICOAGULATION CONSULT NOTE  Pharmacy Consult for Heparin Indication: pulmonary embolus  No Known Allergies  Patient Measurements: Height: 6' (182.9 cm) Weight: 171 lb 4.8 oz (77.7 kg) IBW/kg (Calculated) : 77.6  Vital Signs: Temp: 98.7 F (37.1 C) (04/17 0019) Temp Source: Oral (04/17 0019) BP: 144/80 (04/17 0000) Pulse Rate: 51 (04/17 0000)  Labs:  Recent Labs  11/01/16 2332 11/02/16 0410 11/02/16 0448 11/02/16 0822 11/02/16 1122 11/02/16 1512 11/03/16 0004  HGB 13.4  --   --   --   --   --   --   HCT 38.1*  --   --   --   --   --   --   PLT 122*  --   --   --   --   --   --   HEPARINUNFRC  --   --   --  0.45  --  1.06* 1.00*  CREATININE 1.74*  --  1.27* 1.23  --   --   --   TROPONINI  --  0.03*  --  <0.03 0.03*  --   --     Estimated Creatinine Clearance: 69.2 mL/min (by C-G formula based on SCr of 1.23 mg/dL).   Assessment: 61 y.o. male with PE for heparin  Goal of Therapy:  Heparin level 0.3-0.7 units/ml Monitor platelets by anticoagulation protocol: Yes   Plan:  Decrease Heparin 1000 units/hr Check heparin level in 8 hours.   Caryl Pina 11/03/2016,1:03 AM

## 2016-11-03 NOTE — Care Management Note (Signed)
Case Management Note  Patient Details  Name: Anthony [name] MRN: [id number] Date of Birth: [birth year]  Subjective/Objective:     Pt admitted with SOB               Action/Plan:  PTA from home - with ETOH and experiencing withdrawls - on Precedex.  CSW consulted   Expected Discharge Date:                  Expected Discharge Plan:     In-House Referral:  Clinical Social Work  Discharge planning Services  CM Consult  Post Acute Care Choice:    Choice offered to:     DME Arranged:    DME Agency:     HH Arranged:    HH Agency:     Status of Service:  In process, will continue to follow  If discussed at Long Length of Stay Meetings, dates discussed:    Additional Comments:  Maryclare Labrador, RN 11/03/2016, 3:27 PM

## 2016-11-03 NOTE — Progress Notes (Signed)
ANTICOAGULATION CONSULT NOTE - Follow up [facility] for Heparin Indication: pulmonary embolus with R heart strain  No Known Allergies  Patient Measurements: Height: 6' (182.9 cm) Weight: 171 lb 4.8 oz (77.7 kg) IBW/kg (Calculated) : 77.6  Vital Signs: Temp: 98.9 F (37.2 C) (04/17 0830) Temp Source: Oral (04/17 0830) BP: 157/92 (04/17 1100) Pulse Rate: 60 (04/17 1100)  Labs:  Recent Labs  11/01/16 2332 11/02/16 0410 11/02/16 0448  11/02/16 0822 11/02/16 1122 11/02/16 1512 11/03/16 0004 11/03/16 0254 11/03/16 1040  HGB 13.4  --   --   --   --   --   --   --  11.5*  --   HCT 38.1*  --   --   --   --   --   --   --  32.8*  --   PLT 122*  --   --   --   --   --   --   --  91*  --   HEPARINUNFRC  --   --   --   < > 0.45  --  1.06* 1.00*  --  0.71*  CREATININE 1.74*  --  1.27*  --  1.23  --   --   --  0.62  --   TROPONINI  --  0.03*  --   --  <0.03 0.03*  --   --   --   --   < > = values in this interval not displayed.  Estimated Creatinine Clearance: 106.4 mL/min (by C-G formula based on SCr of 0.62 mg/dL).   Medical History: Past Medical History:  Diagnosis Date  . Hydrocele    Surgically corrected  . Hypertension   . Lumbar compression fracture ([place])   . Skin cancer    exciced 2017    Assessment: 61 y.o. male with PE with R heart strain currently treated with heparin. No AC PTA.  Initial heparin level therapeutic x1 however subsequent heparin levels supratherapeutic despite dose decreases. Most recent heparin level slightly supratherapeutic at 0.71 which is within 10% margin of error. Suspect patient is slow to equilibrate. Hgb down to 11.5, PLTC down to 91. S/p Fluid resuscitation, no bleeding noted.    Goal of Therapy:  Heparin level 0.3-0.7 units/ml Monitor platelets by anticoagulation protocol: Yes   Plan:  Continue heparin 1000 units/hr Check confirmatory heparin level in 6 hours.  Daily CBC, Heparin level Monitor for s/sx  bleeding F/u switch to PO AC option   Carlean Jews, Pharm.D. PGY1 Pharmacy Resident 4/17/201812:11 PM Pager [phone]

## 2016-11-03 NOTE — Progress Notes (Signed)
ANTICOAGULATION CONSULT NOTE - Follow up Anthony [name] for Heparin Indication: pulmonary embolus with R heart strain  No Known Allergies  Patient Measurements: Height: 6' (182.9 cm) Weight: 171 lb 4.8 oz (77.7 kg) IBW/kg (Calculated) : 77.6  Vital Signs: Temp: 97.5 F (36.4 C) (04/17 1614) Temp Source: Oral (04/17 1614) BP: 155/95 (04/17 1700) Pulse Rate: 87 (04/17 1700)  Labs:  Recent Labs  11/01/16 2332 11/02/16 0410 11/02/16 0448 11/02/16 0822 11/02/16 1122  11/03/16 0004 11/03/16 0254 11/03/16 1040 11/03/16 1627  HGB 13.4  --   --   --   --   --   --  11.5*  --   --   HCT 38.1*  --   --   --   --   --   --  32.8*  --   --   PLT 122*  --   --   --   --   --   --  91*  --   --   LABPROT  --   --   --   --   --   --   --   --  14.2  --   INR  --   --   --   --   --   --   --   --  1.09  --   HEPARINUNFRC  --   --   --  0.45  --   < > 1.00*  --  0.71* 0.37  CREATININE 1.74*  --  1.27* 1.23  --   --   --  0.62  --   --   TROPONINI  --  0.03*  --  <0.03 0.03*  --   --   --   --   --   < > = values in this interval not displayed.  Estimated Creatinine Clearance: 106.4 mL/min (by C-G formula based on SCr of 0.62 mg/dL).   Medical History: Past Medical History:  Diagnosis Date  . Hydrocele    Surgically corrected  . Hypertension   . Lumbar compression fracture ([place])   . Skin cancer    exciced 2017    Assessment: 61 y.o. male with PE with R heart strain currently treated with heparin. No AC PTA.  Initial heparin level therapeutic x1 however subsequent heparin levels supratherapeutic despite dose decreases. A heparin level earlier today was slightly supratherapeutic at 0.71 which is within 10% margin of error. However, heparin level rechecked this afternoon and level dropped to 0.37 units/mL on same rate.  Spoke with RN- no issues with line or drip being stopped, lab draws have been peripheral sticks, heparin is running at ordered rate. Patient may  take a longer time than most to re-equilibrate which would explain delay in level dropping with decreased rates.  Goal of Therapy:  Heparin level 0.3-0.7 units/ml Monitor platelets by anticoagulation protocol: Yes   Plan:  Continue heparin 1000 units/hr Check confirmatory heparin level in 6 hours at 2230 to ensure level does not drop further Daily CBC, Heparin level Monitor for s/sx bleeding F/u switch to PO AC option   Anthony [name], PharmD, BCPS Clinical Pharmacist Pager: [phone] 11/03/2016 6:10 PM

## 2016-11-04 ENCOUNTER — Inpatient Hospital Stay (HOSPITAL_COMMUNITY): Payer: Medicaid Other

## 2016-11-04 DIAGNOSIS — F1010 Alcohol abuse, uncomplicated: Secondary | ICD-10-CM

## 2016-11-04 DIAGNOSIS — Z86711 Personal history of pulmonary embolism: Secondary | ICD-10-CM

## 2016-11-04 LAB — MAGNESIUM: Magnesium: 1.4 mg/dL — ABNORMAL LOW (ref 1.7–2.4)

## 2016-11-04 LAB — BASIC METABOLIC PANEL
Anion gap: 9 (ref 5–15)
BUN: 7 mg/dL (ref 6–20)
CALCIUM: 8.8 mg/dL — AB (ref 8.9–10.3)
CHLORIDE: 103 mmol/L (ref 101–111)
CO2: 21 mmol/L — ABNORMAL LOW (ref 22–32)
CREATININE: 0.65 mg/dL (ref 0.61–1.24)
GFR calc non Af Amer: >60 mL/min (ref >60)
Glucose, Bld: 96 mg/dL (ref 65–99)
Potassium: 3 mmol/L — ABNORMAL LOW (ref 3.5–5.1)
SODIUM: 133 mmol/L — AB (ref 135–145)

## 2016-11-04 LAB — GLUCOSE, CAPILLARY
GLUCOSE-CAPILLARY: 74 mg/dL (ref 65–99)
Glucose-Capillary: 105 mg/dL — ABNORMAL HIGH (ref 65–99)
Glucose-Capillary: 73 mg/dL (ref 65–99)
Glucose-Capillary: 86 mg/dL (ref 65–99)

## 2016-11-04 LAB — CBC
HEMATOCRIT: 28.8 % — AB (ref 39.0–52.0)
Hemoglobin: 10 g/dL — ABNORMAL LOW (ref 13.0–17.0)
MCH: 34.4 pg — ABNORMAL HIGH (ref 26.0–34.0)
MCHC: 34.7 g/dL (ref 30.0–36.0)
MCV: 99 fL (ref 78.0–100.0)
Platelets: 97 10*3/uL — ABNORMAL LOW (ref 150–400)
RBC: 2.91 MIL/uL — ABNORMAL LOW (ref 4.22–5.81)
RDW: 13.6 % (ref 11.5–15.5)
WBC: 4.8 10*3/uL (ref 4.0–10.5)

## 2016-11-04 LAB — HEPARIN LEVEL (UNFRACTIONATED)
Heparin Unfractionated: 0.7 IU/mL (ref 0.30–0.70)
Heparin Unfractionated: 0.48 IU/mL (ref 0.30–0.70)

## 2016-11-04 LAB — ANTINUCLEAR ANTIBODIES, IFA: ANA Ab, IFA: NEGATIVE

## 2016-11-04 MED ORDER — HYDROCHLOROTHIAZIDE 25 MG PO TABS
25.0000 mg | ORAL_TABLET | Freq: Every day | ORAL | Status: DC
  Administered 2016-11-04 – 2016-11-07 (×4): 25 mg via ORAL
  Filled 2016-11-04 (×4): qty 1

## 2016-11-04 MED ORDER — LORAZEPAM 1 MG PO TABS
1.0000 mg | ORAL_TABLET | Freq: Three times a day (TID) | ORAL | Status: DC
  Administered 2016-11-04 – 2016-11-05 (×3): 1 mg via ORAL
  Filled 2016-11-04 (×3): qty 1

## 2016-11-04 MED ORDER — MAGNESIUM SULFATE 2 GM/50ML IV SOLN
2.0000 g | Freq: Once | INTRAVENOUS | Status: AC
Start: 2016-11-04 — End: 2016-11-04
  Administered 2016-11-04: 2 g via INTRAVENOUS
  Filled 2016-11-04: qty 50

## 2016-11-04 MED ORDER — LORAZEPAM 1 MG PO TABS
1.0000 mg | ORAL_TABLET | Freq: Four times a day (QID) | ORAL | Status: AC | PRN
  Administered 2016-11-06: 1 mg via ORAL
  Filled 2016-11-04: qty 1

## 2016-11-04 MED ORDER — PANTOPRAZOLE SODIUM 40 MG PO TBEC
40.0000 mg | DELAYED_RELEASE_TABLET | Freq: Every day | ORAL | Status: DC
  Administered 2016-11-04 – 2016-11-07 (×4): 40 mg via ORAL
  Filled 2016-11-04 (×4): qty 1

## 2016-11-04 MED ORDER — IBUPROFEN 600 MG PO TABS
600.0000 mg | ORAL_TABLET | Freq: Four times a day (QID) | ORAL | Status: DC | PRN
  Administered 2016-11-06 – 2016-11-07 (×4): 600 mg via ORAL
  Filled 2016-11-04 (×4): qty 1

## 2016-11-04 MED ORDER — HEPARIN BOLUS VIA INFUSION
3000.0000 [IU] | Freq: Once | INTRAVENOUS | Status: AC
  Administered 2016-11-04: 3000 [IU] via INTRAVENOUS
  Filled 2016-11-04: qty 3000

## 2016-11-04 MED ORDER — POTASSIUM CHLORIDE CRYS ER 20 MEQ PO TBCR
40.0000 meq | EXTENDED_RELEASE_TABLET | Freq: Two times a day (BID) | ORAL | Status: DC
Start: 2016-11-04 — End: 2016-11-07
  Administered 2016-11-04 – 2016-11-07 (×7): 40 meq via ORAL
  Filled 2016-11-04 (×7): qty 2

## 2016-11-04 MED ORDER — LORAZEPAM 2 MG/ML IJ SOLN: 2.0000 mg | INTRAMUSCULAR | Status: DC | PRN

## 2016-11-04 MED ORDER — FOLIC ACID 1 MG PO TABS
1.0000 mg | ORAL_TABLET | Freq: Every day | ORAL | Status: DC
  Administered 2016-11-05 – 2016-11-07 (×3): 1 mg via ORAL
  Filled 2016-11-04 (×3): qty 1

## 2016-11-04 MED ORDER — VITAMIN B-1 100 MG PO TABS
100.0000 mg | ORAL_TABLET | Freq: Every day | ORAL | Status: DC
  Administered 2016-11-05 – 2016-11-07 (×3): 100 mg via ORAL
  Filled 2016-11-04 (×3): qty 1

## 2016-11-04 MED ORDER — LORAZEPAM 2 MG/ML IJ SOLN: 1.0000 mg | Freq: Four times a day (QID) | INTRAMUSCULAR | Status: AC | PRN

## 2016-11-04 NOTE — Progress Notes (Signed)
[facility] for Heparin Indication: pulmonary embolus  No Known Allergies  Patient Measurements: Height: 6' (182.9 cm) Weight: 171 lb 4.8 oz (77.7 kg) IBW/kg (Calculated) : 77.6  Vital Signs: Temp: 98.1 F (36.7 C) (04/18 0723) Temp Source: Oral (04/18 0723) BP: 108/56 (04/18 1000) Pulse Rate: 114 (04/18 1000)  Labs:  Recent Labs  11/01/16 2332 11/02/16 0410  11/02/16 0822 11/02/16 1122  11/03/16 0254 11/03/16 1040  11/03/16 2215 11/04/16 0259 11/04/16 0817 11/04/16 0923  HGB 13.4  --   --   --   --   --  11.5*  --   --   --  10.0*  --   --   HCT 38.1*  --   --   --   --   --  32.8*  --   --   --  28.8*  --   --   PLT 122*  --   --   --   --   --  91*  --   --   --  97*  --   --   LABPROT  --   --   --   --   --   --   --  14.2  --   --   --   --   --   INR  --   --   --   --   --   --   --  1.09  --   --   --   --   --   HEPARINUNFRC  --   --   --  0.45  --   < >  --  0.71*  < > <0.10* 0.70  --  0.48  CREATININE 1.74*  --   < > 1.23  --   --  0.62  --   --   --   --  0.65  --   TROPONINI  --  0.03*  --  <0.03 0.03*  --   --   --   --   --   --   --   --   < > = values in this interval not displayed.  Estimated Creatinine Clearance: 106.4 mL/min (by C-G formula based on SCr of 0.65 mg/dL).   Assessment: 61 y.o. male with PE + R heart strain, on heparin. Heparin level was previously therapeutic on 1000 units/hr however, infusion was paused for ~2 hours 2/2 infiltration overnight. Patient was rebolused with 3000 units and gtt restarted at 1000 units/hr. Heparin level now therapeutic x2 now (first was drawn too early). Hgb slightly low to 10, PLTC low stable. No bleeding noted.    Goal of Therapy:  Heparin level 0.3-0.7 units/ml Monitor platelets by anticoagulation protocol: Yes   Plan:  Continue heparin 1000 units/hr Daily heparin level, CBC Watch s/sx bleeding  Carlean Jews, Pharm.D. PGY1 Pharmacy  Resident 4/18/201811:01 AM Pager [phone]

## 2016-11-04 NOTE — Progress Notes (Signed)
Attempted report to 5W. 

## 2016-11-04 NOTE — Progress Notes (Signed)
**  Preliminary report by tech**  Bilateral lower extremity venous duplex completed. There is no evidence of deep or superficial vein thrombosis involving the right and left lower extremities. All visualized vessels appear patent and compressible. There is no evidence of Baker's cysts bilaterally. Results were given to the patient's nurse, Colletta Maryland.  11/04/16 1:59 PM Carlos Levering RVT

## 2016-11-04 NOTE — Progress Notes (Signed)
Attempted report to 52W X2

## 2016-11-04 NOTE — Progress Notes (Signed)
Family Medicine Teaching Service Daily Progress Note Intern Pager: [phone]  Patient name: Anthony [name] Medical record number: [id number] Date of birth: 12-Feb-1956 Age: 61 y.o. Gender: male  Primary Care Provider: Mercy Riding, MD Consultants: none Code Status: FULL  Pt Overview and Major Events to Date:  4/16 Admitted for Acute PE 4/16 Transferred to ICU for EtOH with DT 4/18 Transferred to med-surg telemetry  Assessment and Plan: Anthony [name] is a 61 y.o. male presenting with shortness of breath. PMH is significant for HTN, EtOH abuse, h/o skin cancer, chronic back pain.  Acute submassive pulmonary embolus. CTA chest with acute PE with evidence of R heart strain. Patient is hemodynamically stable. Per CCM, PE not impressive and patient was not a candidate for tPA. Echo EF 60-65% wall motion normal, G1DD. - Continue heparin gtt  - monitor on telemetry   EtOH abuse with DTs this admission. Required ICU stay for precedex gtt for DTs and now improving on scheduled ativan. - transfer out of ICU this morning - Ativan 1mg  q8h scheduled  - CIWA protocol - folic acid, thiamine daily. Change to po  HTN.  At home takes HCTZ 25mg  qd. Does not take home metoprolol - restart HCTZ 25mg  qd and discontinue clonidine 0.1 BID  - labetalol 10mg  IV q2prn  Hypokalemia K 3.0 this am - replete with KDur 40 mEq BID today - monitor BMET  Decreasing Hgb Likely 2/2 bruising. Hgb 13.4 > 11.5 >10.0 - monitor CBC  GERD At home omeprazole 40mg  qd - protonix IV 40mg  qhs, change to po   H/o skin cancer areas of pink discoloration on face and head. Will need to follow up as outpatient.  Chronic back pain. h/o lumbar compression fracture since 2014. At home on naproxen 500mg  BID prn - discontinue fentanyl - start ibuprofen prn  Acute renal injury with high anion gap, resolved. Likely prerenal.  - monitor BMET  FEN/GI: heart healthy diet, protonix Prophylaxis: heparin gtt  Disposition: pending  medical management  Subjective:  States feels well this morning. Has no recollection of going through withdrawal 2 days ago, states last drink was 2 weeks ago. C/o his chronic back pain this morning and bruising on R arm.  Objective: Temp:  [97.5 F (36.4 C)-98.9 F (37.2 C)] 98.1 F (36.7 C) (04/18 0723) Pulse Rate:  [56-125] 86 (04/18 0600) Resp:  [12-31] 18 (04/18 0600) BP: (84-169)/(46-95) 120/46 (04/18 0600) SpO2:  [97 %-100 %] 100 % (04/18 0600) Physical Exam: General: Sitting up in bed, in NAD Cardiovascular: RRR, no murmurs Respiratory: CTAB, normal effort on room air Abdomen: soft, nontender, nondistended, + bowel sounds Extremities: warm and well perfused, no edema Skin: R arm with 3x2in area of ecchymosis in area of previous restraints   Laboratory:  Recent Labs Lab 11/01/16 2332 11/03/16 0254 11/04/16 0259  WBC 10.1 4.9 4.8  HGB 13.4 11.5* 10.0*  HCT 38.1* 32.8* 28.8*  PLT 122* 91* 97*    Recent Labs Lab 11/01/16 2332  11/02/16 0822 11/03/16 0254 11/04/16 0817  NA 130*  < > 135 135 133*  K 3.1*  < > 3.3* 2.9* 3.0*  CL 89*  < > 100* 107 103  CO2 21*  < > 18* 21* 21*  BUN 53*  < > 47* 20 7  CREATININE 1.74*  < > 1.23 0.62 0.65  CALCIUM 9.5  < > 8.4* 8.1* 8.8*  PROT 7.3  --   --  6.1*  --   BILITOT 1.5*  --   --  1.1  --   ALKPHOS 89  --   --  68  --   ALT 45  --   --  36  --   AST 57*  --   --  70*  --   GLUCOSE 92  < > 62* 111* 96  < > = values in this interval not displayed.   Imaging/Diagnostic Tests: US Abdomen Complete  Result Date: 11/03/2016 CLINICAL DATA:  Alcohol abuse EXAM: ABDOMEN ULTRASOUND COMPLETE COMPARISON:  None in PACs FINDINGS: Gallbladder: No gallstones or wall thickening visualized. No sonographic Murphy sign noted by sonographer. Common bile duct: Diameter: 6.5 mm Liver: The hepatic echotexture is mildly increased diffusely. There is no discrete mass or ductal dilation. The surface contour of the liver remains smooth. IVC: No  abnormality visualized. Pancreas: Visualized portion unremarkable. Spleen: The spleen is normal in size and echotexture. Right Kidney: Length: 11.2 cm. Echogenicity within normal limits. No mass or hydronephrosis visualized. Left Kidney: Length: The 11.1 cm. There is an exophytic midpole cyst measuring 1.6 x 1.3 x 1.9 cm. There is no hydronephrosis. Abdominal aorta: No aneurysm visualized. Other findings: There is no ascites. IMPRESSION: Heterogeneously increased hepatic echotexture may reflect fatty infiltrative change or other hepatocellular disease. No suspicious masses or surface contour abnormality to suggest cirrhosis or hepatocellular malignancy. Normal appearing gallbladder and spleen. No intra-abdominal abnormality is observed elsewhere. There is a simple appearing left renal cyst. Electronically Signed   By: David  Martinique M.D.   On: 11/03/2016 12:06    Bufford Lope, DO 11/04/2016, 8:19 AM PGY-1, [place] Intern pager: [phone], text pages welcome

## 2016-11-05 LAB — BASIC METABOLIC PANEL
Anion gap: 8 (ref 5–15)
BUN: 10 mg/dL (ref 6–20)
CALCIUM: 9 mg/dL (ref 8.9–10.3)
CHLORIDE: 105 mmol/L (ref 101–111)
CO2: 20 mmol/L — AB (ref 22–32)
CREATININE: 0.71 mg/dL (ref 0.61–1.24)
GFR calc non Af Amer: >60 mL/min (ref >60)
Glucose, Bld: 94 mg/dL (ref 65–99)
Potassium: 3.7 mmol/L (ref 3.5–5.1)
Sodium: 133 mmol/L — ABNORMAL LOW (ref 135–145)

## 2016-11-05 LAB — HEPARIN LEVEL (UNFRACTIONATED): Heparin Unfractionated: 0.34 IU/mL (ref 0.30–0.70)

## 2016-11-05 LAB — CBC
HEMATOCRIT: 28.1 % — AB (ref 39.0–52.0)
Hemoglobin: 9.5 g/dL — ABNORMAL LOW (ref 13.0–17.0)
MCH: 34.1 pg — ABNORMAL HIGH (ref 26.0–34.0)
MCHC: 33.8 g/dL (ref 30.0–36.0)
MCV: 100.7 fL — AB (ref 78.0–100.0)
Platelets: 120 10*3/uL — ABNORMAL LOW (ref 150–400)
RBC: 2.79 MIL/uL — ABNORMAL LOW (ref 4.22–5.81)
RDW: 13.6 % (ref 11.5–15.5)
WBC: 3.9 10*3/uL — AB (ref 4.0–10.5)

## 2016-11-05 LAB — HOMOCYSTEINE: HOMOCYSTEINE-NORM: 13.3 umol/L (ref 0.0–15.0)

## 2016-11-05 MED ORDER — LORAZEPAM 0.5 MG PO TABS
0.5000 mg | ORAL_TABLET | Freq: Three times a day (TID) | ORAL | Status: DC
  Administered 2016-11-05 – 2016-11-06 (×3): 0.5 mg via ORAL
  Filled 2016-11-05 (×3): qty 1

## 2016-11-05 MED ORDER — LORATADINE 10 MG PO TABS
10.0000 mg | ORAL_TABLET | Freq: Every day | ORAL | Status: DC
  Administered 2016-11-05 – 2016-11-07 (×3): 10 mg via ORAL
  Filled 2016-11-05 (×3): qty 1

## 2016-11-05 NOTE — Progress Notes (Signed)
Saw Anthony [name] on 11/04/2016 about 3:00 pm. [name] moved from [place] and reestablished his care at [facility] about three months ago. At that time, he was waiting on his  Medicaid. I recommended follow in clinic as soon as he receives his medicaid. Unfortunately, he ended up being admitted to the hospital with acute PE. Besides, his hospitalization was complicated by DT from EtOH withdrawal necessitating ICU stay for precedex drip. He is finally stable, and coming out of ICU to regular floor. He appears well.  I discussed about the importance of follow up in clinic. He is in agreement with this. He mentioned that he is on a lot of OTC medications at home that he doesn't remember the name. I recommended bringing his bottle to his hospital follow up.  I appreciate everyone involved in his care for the excellent care!

## 2016-11-05 NOTE — Progress Notes (Signed)
Family Medicine Teaching Service Daily Progress Note Intern Pager: [phone]  Patient name: Anthony [name] Medical record number: [id number] Date of birth: 06/09/1956 Age: 61 y.o. Gender: male  Primary Care Provider: Mercy Riding, MD Consultants: none Code Status: FULL  Pt Overview and Major Events to Date:  4/16 Admitted for Acute PE 4/16 Transferred to ICU for EtOH with DT 4/18 Transferred to med-surg telemetry  Assessment and Plan: Anthony [name] is a 61 y.o. male presenting with shortness of breath. PMH is significant for HTN, EtOH abuse, h/o skin cancer, chronic back pain.  Acute submassive pulmonary embolus. CTA chest with acute PE with evidence of R heart strain. Patient is hemodynamically stable. Per CCM, PE not impressive and patient was not a candidate for tPA. Echo EF 60-65% wall motion normal, G1DD. - Continue heparin gtt, will likely DC on xarelto when Hgb stable - monitor on telemetry   EtOH abuse with DTs this admission. Required ICU stay for precedex gtt for DTs and now improving on scheduled ativan. CIWA scores 0- 2 -Decrease Ativan from 1mg  to 0.5mg  q8h scheduled - CIWA protocol - folic acid, thiamine daily - Discussed EtOH cessation, patient is precontemplative  Decreasing Hgb, worsening Likely 2/2 bruising. Hgb 13.4 > 9.5 - monitor CBC  HTN.  At home takes HCTZ 25mg  qd. Does not take home metoprolol - HCTZ 25mg  qd  - labetalol 10mg  IV q2prn  Thrombocytopenia, improved. Plt back up to 120s, similar to admit. Unknown baseline but in 200s in 2015. Suspect some component of chronically low PLts given heavy EtOH use - monitor CBC  GERD At home omeprazole 40mg  qd - protonix 40mg  qd   H/o skin cancer areas of pink discoloration on face and head. Will need to follow up as outpatient.  Chronic back pain. h/o lumbar compression fracture since 2014. At home on naproxen 500mg  BID prn - ibuprofen prn  Hypokalemia, resolved  - monitor BMET  Acute renal injury  with high anion gap, resolved. Likely prerenal.  - monitor BMET  FEN/GI: heart healthy diet, protonix Prophylaxis: heparin gtt  Disposition: pending medical management  Subjective:  States feels well this morning. No SOB or CP. Bruising and swelling of R arm has not worsened. Discussed EtOH cessation but states still plans to continue drinking beer/wine after discharge. Some constipation but no hard stools or blood in stool.  Objective: Temp:  [97.4 F (36.3 C)-98.6 F (37 C)] 98.6 F (37 C) (04/19 0928) Pulse Rate:  [86-116] 105 (04/19 0928) Resp:  [15-20] 18 (04/19 0928) BP: (93-134)/(56-85) 109/84 (04/19 0928) SpO2:  [91 %-100 %] 95 % (04/19 0928) Weight:  [180 lb (81.6 kg)] 180 lb (81.6 kg) (04/18 1549) Physical Exam: General: Sitting up in bed, in NAD Cardiovascular: RRR, no murmurs Respiratory: CTAB, normal effort on room air Abdomen: soft, nontender, nondistended, + bowel sounds Extremities: warm and well perfused, no edema Skin: R arm with 5x5 inch area of ecchymosis in area of previous restraints with smaller 2x3inch area on L arm.  Laboratory:  Recent Labs Lab 11/03/16 0254 11/04/16 0259 11/05/16 0417  WBC 4.9 4.8 3.9*  HGB 11.5* 10.0* 9.5*  HCT 32.8* 28.8* 28.1*  PLT 91* 97* 120*    Recent Labs Lab 11/01/16 2332  11/03/16 0254 11/04/16 0817 11/05/16 0417  NA 130*  < > 135 133* 133*  K 3.1*  < > 2.9* 3.0* 3.7  CL 89*  < > 107 103 105  CO2 21*  < > 21* 21* 20*  BUN 53*  < > 20 7 10   CREATININE 1.74*  < > 0.62 0.65 0.71  CALCIUM 9.5  < > 8.1* 8.8* 9.0  PROT 7.3  --  6.1*  --   --   BILITOT 1.5*  --  1.1  --   --   ALKPHOS 89  --  68  --   --   ALT 45  --  36  --   --   AST 57*  --  70*  --   --   GLUCOSE 92  < > 111* 96 94  < > = values in this interval not displayed.   Imaging/Diagnostic Tests: No results found.  Bufford Lope, DO 11/05/2016, 9:46 AM PGY-1, [place] Intern pager: [phone], text pages welcome

## 2016-11-05 NOTE — Progress Notes (Signed)
ANTICOAGULATION CONSULT NOTE - Follow-Up Consult Pharmacy Consult for Heparin Indication: pulmonary embolus  No Known Allergies  Patient Measurements: Height: 5\' 10"  (177.8 cm) Weight: 180 lb (81.6 kg) IBW/kg (Calculated) : 73  Vital Signs: Temp: 98.6 F (37 C) (04/19 0928) Temp Source: Oral (04/19 0928) BP: 135/83 (04/19 1321) Pulse Rate: 86 (04/19 1321)  Labs:  Recent Labs  11/03/16 0254 11/03/16 1040  11/04/16 0259 11/04/16 0817 11/04/16 0923 11/05/16 0417  HGB 11.5*  --   --  10.0*  --   --  9.5*  HCT 32.8*  --   --  28.8*  --   --  28.1*  PLT 91*  --   --  97*  --   --  120*  LABPROT  --  14.2  --   --   --   --   --   INR  --  1.09  --   --   --   --   --   HEPARINUNFRC  --  0.71*  < > 0.70  --  0.48 0.34  CREATININE 0.62  --   --   --  0.65  --  0.71  < > = values in this interval not displayed.  Estimated Creatinine Clearance: 100.1 mL/min (by C-G formula based on SCr of 0.71 mg/dL).   Assessment: 61 y.o. male with PE + R heart strain, on heparin. Heparin levels remain therapeutic on 1000 units/hr.  Hgb trending down, no bleeding noted.   Goal of Therapy:  Heparin level 0.3-0.7 units/ml Monitor platelets by anticoagulation protocol: Yes   Plan:  Continue heparin at 1000 units/hr Daily heparin level, CBC Watch s/sx bleeding Follow-up transition to oral anticoagulant Does patient need benefits check for coverage of NOAC?  [employer], Pharm.D., BCPS Clinical Pharmacist Pager [phone] 11/05/2016 2:40 PM

## 2016-11-06 LAB — CBC
HCT: 27.5 % — ABNORMAL LOW (ref 39.0–52.0)
HEMOGLOBIN: 9.1 g/dL — AB (ref 13.0–17.0)
MCH: 33.6 pg (ref 26.0–34.0)
MCHC: 33.1 g/dL (ref 30.0–36.0)
MCV: 101.5 fL — ABNORMAL HIGH (ref 78.0–100.0)
Platelets: 156 10*3/uL (ref 150–400)
RBC: 2.71 MIL/uL — AB (ref 4.22–5.81)
RDW: 14.1 % (ref 11.5–15.5)
WBC: 4.5 10*3/uL (ref 4.0–10.5)

## 2016-11-06 LAB — BASIC METABOLIC PANEL
Anion gap: 7 (ref 5–15)
BUN: 10 mg/dL (ref 6–20)
CALCIUM: 9.8 mg/dL (ref 8.9–10.3)
CHLORIDE: 107 mmol/L (ref 101–111)
CO2: 23 mmol/L (ref 22–32)
Creatinine, Ser: 0.8 mg/dL (ref 0.61–1.24)
GFR calc Af Amer: >60 mL/min (ref >60)
GFR calc non Af Amer: >60 mL/min (ref >60)
Glucose, Bld: 98 mg/dL (ref 65–99)
Potassium: 4.3 mmol/L (ref 3.5–5.1)
Sodium: 137 mmol/L (ref 135–145)

## 2016-11-06 LAB — GLUCOSE, CAPILLARY: Glucose-Capillary: 88 mg/dL (ref 65–99)

## 2016-11-06 LAB — MAGNESIUM: Magnesium: 1.5 mg/dL — ABNORMAL LOW (ref 1.7–2.4)

## 2016-11-06 LAB — RETICULOCYTES
RBC.: 2.72 MIL/uL — ABNORMAL LOW (ref 4.22–5.81)
RETIC COUNT ABSOLUTE: 87 10*3/uL (ref 19.0–186.0)
RETIC CT PCT: 3.2 % — AB (ref 0.4–3.1)

## 2016-11-06 LAB — HEPARIN LEVEL (UNFRACTIONATED): HEPARIN UNFRACTIONATED: 0.29 [IU]/mL — AB (ref 0.30–0.70)

## 2016-11-06 LAB — VITAMIN B12: VITAMIN B 12: 368 pg/mL (ref 180–914)

## 2016-11-06 MED ORDER — ACETAMINOPHEN 325 MG PO TABS
650.0000 mg | ORAL_TABLET | Freq: Four times a day (QID) | ORAL | Status: DC | PRN
  Administered 2016-11-06 – 2016-11-07 (×4): 650 mg via ORAL
  Filled 2016-11-06 (×4): qty 2

## 2016-11-06 MED ORDER — RIVAROXABAN 15 MG PO TABS
15.0000 mg | ORAL_TABLET | Freq: Two times a day (BID) | ORAL | Status: DC
  Administered 2016-11-06 – 2016-11-07 (×4): 15 mg via ORAL
  Filled 2016-11-06 (×5): qty 1

## 2016-11-06 NOTE — Progress Notes (Signed)
Family Medicine Teaching Service Daily Progress Note Intern Pager: [phone]  Patient name: Anthony [name] Medical record number: [id number] Date of birth: October 24, 1955 Age: 61 y.o. Gender: male  Primary Care Provider: Mercy Riding, MD Consultants: none Code Status: FULL  Pt Overview and Major Events to Date:  4/16 Admitted for Acute PE 4/16 Transferred to ICU for EtOH with DT 4/18 Transferred to med-surg telemetry  Assessment and Plan: Anthony [name] is a 61 y.o. male presenting with shortness of breath. PMH is significant for HTN, EtOH abuse, h/o skin cancer, chronic back pain.  Acute submassive pulmonary embolus. CTA chest with acute PE with evidence of R heart strain. Patient is hemodynamically stable. Per CCM, PE not impressive and patient was not a candidate for tPA. Echo EF 60-65% wall motion normal, G1DD. LE doppler neg for DVTs. - Continue Xarelto  - monitor on telemetry    EtOH abuse with DTs this admission. Required ICU stay for precedex gtt for DTs and now improving on scheduled ativan. CIWA scores 0-2 - CIWA protocol - folic acid, thiamine daily - Discussed EtOH cessation, patient is precontemplative  Decreasing Hgb 2/2 ecchymosis, stable/improving. Hgb 13.4 > 9.5>9.1>9.7 but retic 3.2% and plts 204 - monitor CBC  HTN.  At home takes HCTZ 25mg  qd. Does not take home metoprolol - HCTZ 25mg  qd  - labetalol 10mg  IV q2prn  Thrombocytopenia, resolved. Plt 204 improved from admit. Suspect some component of chronically low Plts given heavy EtOH use - monitor CBC  GERD At home omeprazole 40mg  qd - protonix 40mg  qd   H/o skin cancer areas of pink discoloration on face and head. Will need to follow up as outpatient.  Chronic back pain. h/o lumbar compression fracture since 2014. At home on naproxen 500mg  BID prn - ibuprofen prn  Hypokalemia, resolved  - monitor BMET  Acute renal injury with high anion gap, resolved. Likely prerenal.  - monitor BMET  FEN/GI: heart  healthy diet, protonix Prophylaxis: heparin gtt  Disposition: pending medical management  Subjective:  Patient without SOB or chest pain. Doing well. Feels ready to go home.   Objective: Temp:  [97.8 F (36.6 C)-98.6 F (37 C)] 98.5 F (36.9 C) (04/20 2133) Pulse Rate:  [79-91] 85 (04/20 2133) Resp:  [18-20] 19 (04/20 2133) BP: (104-124)/(68-84) 124/84 (04/20 2133) SpO2:  [100 %] 100 % (04/20 2133) Physical Exam: General: Sitting up in bed, in NAD Cardiovascular: RRR, no murmurs Respiratory: CTAB, normal effort on room air Abdomen: soft, nontender, nondistended, + bowel sounds Extremities: warm and well perfused, no edema Skin: R arm with large area of ecchymosis in area of previous restraints showing signs of healing with smaller 2x3inch area on L arm. No hematoma.  Laboratory:  Recent Labs Lab 11/04/16 0259 11/05/16 0417 11/06/16 0442  WBC 4.8 3.9* 4.5  HGB 10.0* 9.5* 9.1*  HCT 28.8* 28.1* 27.5*  PLT 97* 120* 156    Recent Labs Lab 11/01/16 2332  11/03/16 0254 11/04/16 0817 11/05/16 0417 11/06/16 0942  NA 130*  < > 135 133* 133* 137  K 3.1*  < > 2.9* 3.0* 3.7 4.3  CL 89*  < > 107 103 105 107  CO2 21*  < > 21* 21* 20* 23  BUN 53*  < > 20 7 10 10   CREATININE 1.74*  < > 0.62 0.65 0.71 0.80  CALCIUM 9.5  < > 8.1* 8.8* 9.0 9.8  PROT 7.3  --  6.1*  --   --   --  BILITOT 1.5*  --  1.1  --   --   --   ALKPHOS 89  --  68  --   --   --   ALT 45  --  36  --   --   --   AST 57*  --  70*  --   --   --   GLUCOSE 92  < > 111* 96 94 98  < > = values in this interval not displayed.   Imaging/Diagnostic Tests: No results found.  Theo Reither [employer], MD 11/06/2016, 11:06 PM PGY-2, [place] Intern pager: [phone], text pages welcome

## 2016-11-06 NOTE — Progress Notes (Signed)
Family Medicine Teaching Service Daily Progress Note Intern Pager: [phone]  Patient name: Anthony [name] Medical record number: [id number] Date of birth: Apr 09, 1956 Age: 61 y.o. Gender: male  Primary Care Provider: Mercy Riding, MD Consultants: none Code Status: FULL  Pt Overview and Major Events to Date:  4/16 Admitted for Acute PE 4/16 Transferred to ICU for EtOH with DT 4/18 Transferred to med-surg telemetry  Assessment and Plan: Anthony [name] is a 61 y.o. male presenting with shortness of breath. PMH is significant for HTN, EtOH abuse, h/o skin cancer, chronic back pain.  Acute submassive pulmonary embolus. CTA chest with acute PE with evidence of R heart strain. Patient is hemodynamically stable. Per CCM, PE not impressive and patient was not a candidate for tPA. Echo EF 60-65% wall motion normal, G1DD. LE doppler neg for DVTs. - Transition from heparin gtt to xarelto today - monitor on telemetry    EtOH abuse with DTs this admission. Required ICU stay for precedex gtt for DTs and now improving on scheduled ativan. CIWA scores 0- 2 - Discontinue Ativan 0.5mg  q8h scheduled - CIWA protocol - folic acid, thiamine daily - Discussed EtOH cessation, patient is precontemplative  Decreasing Hgb 2/2 ecchymosis, stable. Hgb 13.4 > 9.5>9.1 but retic 3.2% and Plts improved at 156 - monitor CBC  HTN.  At home takes HCTZ 25mg  qd. Does not take home metoprolol - HCTZ 25mg  qd  - labetalol 10mg  IV q2prn  Thrombocytopenia, improved. Plt 156 improved from admit. Unknown baseline but in 200s in 2015. Suspect some component of chronically low Plts given heavy EtOH use - monitor CBC  GERD At home omeprazole 40mg  qd - protonix 40mg  qd   H/o skin cancer areas of pink discoloration on face and head. Will need to follow up as outpatient.  Chronic back pain. h/o lumbar compression fracture since 2014. At home on naproxen 500mg  BID prn - ibuprofen prn  Hypokalemia, resolved  - monitor  BMET  Acute renal injury with high anion gap, resolved. Likely prerenal.  - monitor BMET  FEN/GI: heart healthy diet, protonix Prophylaxis: heparin gtt  Disposition: pending medical management  Subjective:  States feels well this morning. No SOB or CP. Only complaint is his chronic back pain.   Objective: Temp:  [97.8 F (36.6 C)-98.6 F (37 C)] 97.8 F (36.6 C) (04/20 0611) Pulse Rate:  [79-114] 79 (04/20 0611) Resp:  [18] 18 (04/20 0611) BP: (109-135)/(66-84) 109/74 (04/20 0611) SpO2:  [95 %-100 %] 100 % (04/20 7654) Physical Exam: General: Sitting up in bed, in NAD Cardiovascular: RRR, no murmurs Respiratory: CTAB, normal effort on room air Abdomen: soft, nontender, nondistended, + bowel sounds Extremities: warm and well perfused, no edema Skin: R arm with large area of ecchymosis in area of previous restraints showing signs of healing with smaller 2x3inch area on L arm. No hematoma.  Laboratory:  Recent Labs Lab 11/04/16 0259 11/05/16 0417 11/06/16 0442  WBC 4.8 3.9* 4.5  HGB 10.0* 9.5* 9.1*  HCT 28.8* 28.1* 27.5*  PLT 97* 120* 156    Recent Labs Lab 11/01/16 2332  11/03/16 0254 11/04/16 0817 11/05/16 0417  NA 130*  < > 135 133* 133*  K 3.1*  < > 2.9* 3.0* 3.7  CL 89*  < > 107 103 105  CO2 21*  < > 21* 21* 20*  BUN 53*  < > 20 7 10   CREATININE 1.74*  < > 0.62 0.65 0.71  CALCIUM 9.5  < > 8.1* 8.8* 9.0  PROT 7.3  --  6.1*  --   --   BILITOT 1.5*  --  1.1  --   --   ALKPHOS 89  --  68  --   --   ALT 45  --  36  --   --   AST 57*  --  70*  --   --   GLUCOSE 92  < > 111* 96 94  < > = values in this interval not displayed.   Imaging/Diagnostic Tests: No results found.  Bufford Lope, DO 11/06/2016, 7:16 AM PGY-1, [place] Intern pager: [phone], text pages welcome

## 2016-11-06 NOTE — Progress Notes (Signed)
ANTICOAGULATION CONSULT NOTE - Follow-Up Consult  Pharmacy Consult for Heparin Indication: pulmonary embolus  No Known Allergies  Patient Measurements: Height: 5\' 10"  (177.8 cm) Weight: 180 lb (81.6 kg) IBW/kg (Calculated) : 73  Vital Signs: Temp: 97.8 F (36.6 C) (04/20 0611) BP: 109/74 (04/20 0611) Pulse Rate: 79 (04/20 0611)  Labs:  Recent Labs  11/03/16 1040  11/04/16 0259 11/04/16 0817 11/04/16 0923 11/05/16 0417 11/06/16 0442  HGB  --   < > 10.0*  --   --  9.5* 9.1*  HCT  --   --  28.8*  --   --  28.1* 27.5*  PLT  --   --  97*  --   --  120* 156  LABPROT 14.2  --   --   --   --   --   --   INR 1.09  --   --   --   --   --   --   HEPARINUNFRC 0.71*  < > 0.70  --  0.48 0.34 0.29*  CREATININE  --   --   --  0.65  --  0.71  --   < > = values in this interval not displayed.  Estimated Creatinine Clearance: 100.1 mL/min (by C-G formula based on SCr of 0.71 mg/dL).   Assessment: 61 y.o. male with PE + R heart strain, on heparin. Heparin level down to slightly subtherapeutic (0.29) on gtt at 1000 units/hr. Hgb down to 9.1. No issues with line or bleeding reported per RN.  Goal of Therapy:  Heparin level 0.3-0.7 units/ml (aim for 0.5-0.7 with PE with R heart strain) Monitor platelets by anticoagulation protocol: Yes   Plan:  Increase heparin to 1150 units/hr Will f/u 6 hr heparin level  Sherlon Handing, PharmD, BCPS Clinical pharmacist, pager [phone] 11/06/2016 6:28 AM

## 2016-11-06 NOTE — Discharge Instructions (Addendum)
You were admitted for a clot in your lung. You will need to be on blood clot therapy for 3-6 months. You are being sent out with 20 days of this medication (xarelto), however you need to make sure to go to your hospital follow up as you need to take this medication for 3- 6 months and will need to have it managed by your outpatient clinic. This is very important if you don't follow up and take your medication. You could have another life-threatening clot.  Below is some information of the illness you were treated for during your hospitalization.     Pulmonary Embolism A pulmonary embolism (PE) is a sudden blockage or decrease of blood flow in one lung or both lungs. Most blockages come from a blood clot that travels from the legs or the pelvis to the lungs. PE is a dangerous and potentially life-threatening condition if it is not treated right away. What are the causes? A pulmonary embolism occurs most commonly when a blood clot travels from one of your veins to your lungs. Rarely, PE is caused by air, fat, amniotic fluid, or part of a tumor traveling through your veins to your lungs. What increases the risk? A PE is more likely to develop in:  People who smoke.  People who areolder, especially over 28 years of age.  People who are overweight (obese).  People who sit or lie still for a long time, such as during long-distance travel (over 4 hours), bed rest, hospitalization, or during recovery from certain medical conditions like a stroke.  People who do not engage in much physical activity (sedentary lifestyle).  People who have chronic breathing disorders.  People whohave a personal or family history of blood clots or blood clotting disease.  People whohave peripheral vascular disease (PVD), diabetes, or some types of cancer.  People who haveheart disease, especially if the person had a recent heart attack or has congestive heart failure.  People who have neurological diseases that  affect the legs (leg paresis).  People who have had a traumatic injury, such as breaking a hip or leg.  People whohave recently had major or lengthy surgery, especially on the hip, knee, or abdomen.  People who have hada central line placed inside a large vein.  People who takemedicines that contain the hormone estrogen. These include birth control pills and hormone replacement therapy.  Pregnancy or during childbirth or the postpartum period. What are the signs or symptoms? The symptoms of a PE usually start suddenly and include:  Shortness of breath while active or at rest.  Coughing or coughing up blood or blood-tinged mucus.  Chest pain that is often worse with deep breaths.  Rapid or irregular heartbeat.  Feeling light-headed or dizzy.  Fainting.  Feelinganxious.  Sweating. There may also be pain and swelling in a leg if that is where the blood clot started. These symptoms may represent a serious problem that is an emergency. Do not wait to see if the symptoms will go away. Get medical help right away. Call your local emergency services (911 in the [place] Do not drive yourself to the hospital.  How is this diagnosed? Your health care provider will take a medical history and perform a physical exam. You may also have other tests, including:  Blood tests to assess the clotting properties of your blood, assess oxygen levels in your blood, and find blood clots.  Imaging tests, such as CT, ultrasound, MRI, X-ray, and other tests to see if  you have clots anywhere in your body.  An electrocardiogram (ECG) to look for heart strain from blood clots in the lungs. How is this treated? The main goals of PE treatment are:  To stop a blood clot from growing larger.  To stop new blood clots from forming. The type of treatment that you receive depends on many factors, such as the cause of your PE, your risk for bleeding or developing more clots, and other medical conditions that  you have. Sometimes, a combination of treatments is necessary. This condition may be treated with:  Medicines, including newer oral blood thinners (anticoagulants), warfarin, low molecular weight heparins, thrombolytics, or heparins.  Wearing compression stockings or using different types of devices.  Surgery (rare) to remove the blood clot or to place a filter in your abdomen to stop the blood clot from traveling to your lungs. Treatments for a PE are often divided into immediate treatment, long-term treatment (up to 3 months after PE), and extended treatment (more than 3 months after PE). Your treatment may continue for several months. This is called maintenance therapy, and it is used to prevent the forming of new blood clots. You can work with your health care provider to choose the treatment program that is best for you. What are anticoagulants?  Anticoagulants are medicines that treat PEs. They can stop current blood clots from growing and stop new clots from forming. They cannot dissolve existing clots. Your body dissolves clots by itself over time. Anticoagulants are given by mouth, by injection, or through an IV tube. What are thrombolytics?  Thrombolytics are clot-dissolving medicines that are used to dissolve a PE. They carry a high risk of bleeding, so they tend to be used only in severe cases or if you have very low blood pressure. Follow these instructions at home: If you are taking a newer oral anticoagulant:   Take the medicine every single day at the same time each day.  Understand what foods and drugs interact with this medicine.  Understand that there are no regular blood tests required when using this medicine.  Understandthe side effects of this medicine, including excessive bruising or bleeding. Ask your health care provider or pharmacist about other possible side effects. If you are taking warfarin:   Understand how to take warfarin and know which foods can affect how  warfarin works in Veterinary surgeon.  Understand that it is dangerous to taketoo much or too Golz warfarin. Too much warfarin increases the risk of bleeding. Too Abaya warfarin continues to allow the risk for blood clots.  Follow your PT and INR blood testing schedule. The PT and INR results allow your health care provider to adjust your dose of warfarin. It is very important that you have your PT and INR tested as often as told by your health care provider.  Avoid major changes in your diet, or tell your health care provider before you change your diet. Arrange a visit with a registered dietitian to answer your questions. Many foods, especially foods that are high in vitamin K, can interfere with warfarin and affect the PT and INR results. Eat a consistent amount of foods that are high in vitamin K, such as:  Spinach, kale, broccoli, cabbage, collard greens, turnip greens, Brussels sprouts, peas, cauliflower, seaweed, and parsley.  Beef liver and pork liver.  Green tea.  Soybean oil.  Tell your health care provider about any and all medicines, vitamins, and supplements that you take, including aspirin and other over-the-counter anti-inflammatory  medicines. Be especially cautious with aspirin and anti-inflammatory medicines. Do not take those before you ask your health care provider if it is safe to do so. This is important because many medicines can interfere with warfarin and affect the PT and INR results.  Do not start or stop taking any over-the-counter or prescription medicine unless your health care provider or pharmacist tells you to do so. If you take warfarin, you will also need to do these things:  Hold pressure over cuts for longer than usual.  Tell your dentist and other health care providers that you are taking warfarin before you have any procedures in which bleeding may occur.  Avoid alcohol or drink very small amounts. Tell your health care provider if you change your alcohol  intake.  Do not use tobacco products, including cigarettes, chewing tobacco, and e-cigarettes. If you need help quitting, ask your health care provider.  Avoid contact sports. General instructions   Take over-the-counter and prescription medicines only as told by your health care provider. Anticoagulant medicines can have side effects, including easy bruising and difficulty stopping bleeding. If you are prescribed an anticoagulant, you will also need to do these things:  Hold pressure over cuts for longer than usual.  Tell your dentist and other health care providers that you are taking anticoagulants before you have any procedures in which bleeding may occur.  Avoid contact sports.  Wear a medical alert bracelet or carry a medical alert card that says you have had a PE.  Ask your health care provider how soon you can go back to your normal activities. Stay active to prevent new blood clots from forming.  Make sure to exercise while traveling or when you have been sitting or standing for a long period of time. It is very important to exercise. Exercise your legs by walking or by tightening and relaxing your leg muscles often. Take frequent walks.  Wear compression stockings as told by your health care provider to help prevent more blood clots from forming.  Do not use tobacco products, including cigarettes, chewing tobacco, and e-cigarettes. If you need help quitting, ask your health care provider.  Keep all follow-up appointments with your health care provider. This is important. How is this prevented? Take these actions to decrease your risk of developing another PE:  Exercise regularly. For at least 30 minutes every day, engage in:  Activity that involves moving your arms and legs.  Activity that encourages good blood flow through your body by increasing your heart rate.  Exercise your arms and legs every hour during long-distance travel (over 4 hours). Drink plenty of water and  avoid drinking alcohol while traveling.  Avoid sitting or lying in bed for long periods of time without moving your legs.  Maintain a weight that is appropriate for your height. Ask your health care provider what weight is healthy for you.  If you are a woman who is over 3 years of age, avoid unnecessary use of medicines that contain estrogen. These include birth control pills.  Do not smoke, especially if you take estrogen medicines. If you need help quitting, ask your health care provider.  If you are at very high risk for PE, wear compression stockings.  If you recently had a PE, have regularly scheduled ultrasound testing on your legs to check for new blood clots. If you are hospitalized, prevention measures may include:  Early walking after surgery, as soon as your health care provider says that it is safe.  Receiving anticoagulants to prevent blood clots. If you cannot take anticoagulants, other options may be available, such as wearing compression stockings or using different types of devices. Get help right away if:  You have new or increased pain, swelling, or redness in an arm or leg.  You have numbness or tingling in an arm or leg.  You have shortness of breath while active or at rest.  You have chest pain.  You have a rapid or irregular heartbeat.  You feel light-headed or dizzy.  You cough up blood.  You notice blood in your vomit, bowel movement, or urine.  You have a fever. These symptoms may represent a serious problem that is an emergency. Do not wait to see if the symptoms will go away. Get medical help right away. Call your local emergency services (911 in the [place] Do not drive yourself to the hospital. This information is not intended to replace advice given to you by your health care provider. Make sure you discuss any questions you have with your health care provider. Document Released: 07/03/2000 Document Revised: 12/12/2015 Document Reviewed:  10/31/2014 Elsevier Interactive Patient Education  2017 [place] on my medicine - XARELTO (rivaroxaban)  This medication education was reviewed with me or my healthcare representative as part of my discharge preparation.    WHY WAS XARELTO PRESCRIBED FOR YOU? Xarelto was prescribed to treat blood clots that may have been found in the veins of your legs (deep vein thrombosis) or in your lungs (pulmonary embolism) and to reduce the risk of them occurring again.  What do you need to know about Xarelto? The starting dose is one 15 mg tablet taken TWICE daily with food for the FIRST 21 DAYS then on May 11th, 2018  the dose is changed to one 20 mg tablet taken ONCE A DAY with your evening meal.  DO NOT stop taking Xarelto without talking to the health care provider who prescribed the medication.  Refill your prescription for 20 mg tablets before you run out.  After discharge, you should have regular check-up appointments with your healthcare provider that is prescribing your Xarelto.  In the future your dose may need to be changed if your kidney function changes by a significant amount.  What do you do if you miss a dose? If you are taking Xarelto TWICE DAILY and you miss a dose, take it as soon as you remember. You may take two 15 mg tablets (total 30 mg) at the same time then resume your regularly scheduled 15 mg twice daily the next day.  If you are taking Xarelto ONCE DAILY and you miss a dose, take it as soon as you remember on the same day then continue your regularly scheduled once daily regimen the next day. Do not take two doses of Xarelto at the same time.   Important Safety Information Xarelto is a blood thinner medicine that can cause bleeding. You should call your healthcare provider right away if you experience any of the following: ? Bleeding from an injury or your nose that does not stop. ? Unusual colored urine (red or dark brown) or unusual colored  stools (red or black). ? Unusual bruising for unknown reasons. ? A serious fall or if you hit your head (even if there is no bleeding).  Some medicines may interact with Xarelto and might increase your risk of bleeding while on Xarelto. To help avoid this, consult your healthcare provider or pharmacist prior to using any  new prescription or non-prescription medications, including herbals, vitamins, non-steroidal anti-inflammatory drugs (NSAIDs) and supplements.  This website has more information on Xarelto: [email or web address]-benson.[email or web address]

## 2016-11-07 LAB — BASIC METABOLIC PANEL
ANION GAP: 8 (ref 5–15)
BUN: 11 mg/dL (ref 6–20)
CALCIUM: 9.5 mg/dL (ref 8.9–10.3)
CO2: 23 mmol/L (ref 22–32)
CREATININE: 0.98 mg/dL (ref 0.61–1.24)
Chloride: 104 mmol/L (ref 101–111)
Glucose, Bld: 98 mg/dL (ref 65–99)
Potassium: 4.4 mmol/L (ref 3.5–5.1)
SODIUM: 135 mmol/L (ref 135–145)

## 2016-11-07 LAB — CBC
HCT: 28.8 % — ABNORMAL LOW (ref 39.0–52.0)
Hemoglobin: 9.7 g/dL — ABNORMAL LOW (ref 13.0–17.0)
MCH: 34.9 pg — ABNORMAL HIGH (ref 26.0–34.0)
MCHC: 33.7 g/dL (ref 30.0–36.0)
MCV: 103.6 fL — ABNORMAL HIGH (ref 78.0–100.0)
Platelets: 204 10*3/uL (ref 150–400)
RBC: 2.78 MIL/uL — ABNORMAL LOW (ref 4.22–5.81)
RDW: 14.4 % (ref 11.5–15.5)
WBC: 3.9 10*3/uL — AB (ref 4.0–10.5)

## 2016-11-07 MED ORDER — MAGNESIUM SULFATE 2 GM/50ML IV SOLN
2.0000 g | Freq: Once | INTRAVENOUS | Status: AC
  Administered 2016-11-07: 2 g via INTRAVENOUS
  Filled 2016-11-07: qty 50

## 2016-11-07 MED ORDER — RIVAROXABAN 15 MG PO TABS: 15.0000 mg | ORAL_TABLET | Freq: Two times a day (BID) | ORAL | 0 refills | Status: DC

## 2016-11-07 NOTE — Progress Notes (Signed)
Pt. Came back for his home medications that was not given back to him at discharged.  Meet pt. At [facility] and returned medications.  Pt. Was very upset about having to come back, also stated he didn't get to the pharmacy to pick up his discharge medication.  Alphonzo Lemmings, RN

## 2016-11-07 NOTE — Care Management Note (Signed)
Case Management Note  Patient Details  Name: PURVIS SIDLE MRN: [id number] Date of Birth: 03-Apr-1956  Subjective/Objective:            Patient admitted from home with PE. Order in place for patient to DC to home. No CM consult, orders or needs identified. Jennye Moccasin covered by Medicaid.         Action/Plan:  DC to home self care.   Expected Discharge Date:  11/07/16               Expected Discharge Plan:  Home/Self Care  In-House Referral:  Clinical Social Work  Discharge planning Services  CM Consult  Post Acute Care Choice:    Choice offered to:     DME Arranged:    DME Agency:     HH Arranged:    HH Agency:     Status of Service:  Completed, signed off  If discussed at [employer], dates discussed:    Additional Comments:  Carles Collet, RN 11/07/2016, 11:02 AM

## 2016-11-09 NOTE — Progress Notes (Deleted)
  HPI:  Anthony [name] presents for hospital follow up. Patient was hospitalized from 11/01/2016 to 11/07/2016 with an acute PE.  PMH is significant for HTN, EtOH abuse, h/o skin cancer, chronic back pain. He also underwent precedex gtt in the ICU due to alcohol withdrawal during his hospital stay.  Patient now reports ***  1. Please ensure patient is on Xarelto 15 mg twice daily (started on 11/07/16) for 21 days followed by 20 mg once daily (start on 11/28/16).   ROS: See HPI.  [place]: ***  PHYSICAL EXAM: There were no vitals taken for this visit. Gen: *** HEENT: *** Heart: *** Lungs: *** Neuro: *** Ext: ***  ASSESSMENT/PLAN:  No problem-specific Assessment & Plan notes found for this encounter.  FOLLOW UP: Follow up in *** for ***  Everrett Coombe, MD, [place] PGY1 [place]

## 2016-11-09 NOTE — Discharge Summary (Signed)
[facility] Discharge Summary  Patient name: Anthony [name] Medical record number: [id number] Date of birth: [birth year] Age: 61 y.o. Gender: male Date of Admission: 11/01/2016  Date of Discharge: 11/07/16 Admitting Physician: Zenia Resides, MD  Primary Care Provider: Mercy Riding, MD Consultants: none  Indication for Hospitalization: Pulmonary embolism  Discharge Diagnoses/Problem List:  Acute submassive pulmonary embolus EtOH abuse with DTs Acute anemia 2/2 ecchymosis HTN Thrombocytopenia GERD H/o skin CA Chronic back pain AKI, resolved  Disposition: Home  Discharge Condition: Stable, improved  Discharge Exam:  General: Sitting up in bed, in NAD Cardiovascular: RRR, no murmurs Respiratory: CTAB, normal effort on room air Abdomen: soft, nontender, nondistended, + bowel sounds Extremities: warm and well perfused, no edema Skin: R arm with large area of ecchymosis in area of previous restraints showing signs of healing with smaller 2x3inch area on L arm. No hematoma.  Brief Hospital Course:  Anthony [name] is a 61 y.o. male with PMH significant for HTN, EtOH abuse, h/o skin cancer, chronic back pain who presented with dyspnea and was found to have an acute PE.  Patient presented with worsening shortness of breath and chest tightness and was found to have acute PE with evidence of R heart strain on CTA chest imaging. He was hemodynamically stable and not a candidate for tPA so was started on heparin gtt. Shortly after admission, patient went into acute alcohol withdrawal prompting a transfer to ICU for a precedex gtt. During his acute mental status change, patient had lost his IV and needed restraints to re-establish IV access. Given his thrashing and being administered heparin he developed significant bruising over his upper extremities. Patient did well in the ICU and was able to be transitioned out to the floor. His hemoglobin and platelets had an acute  drop 2/2 to ecchymoses which stabilized. Patient was then transitioned over to xarelto and tolerated well before discharge.  Issues for Follow Up:  1. Please ensure patient is on Xarelto 15 mg twice daily (started on 11/07/16) for 21 days followed by 20 mg once daily (start on 11/28/16).  Significant Procedures: none  Significant Labs and Imaging:   Recent Labs Lab 11/05/16 0417 11/06/16 0442 11/07/16 0530  WBC 3.9* 4.5 3.9*  HGB 9.5* 9.1* 9.7*  HCT 28.1* 27.5* 28.8*  PLT 120* 156 204    Recent Labs Lab 11/03/16 0254 11/04/16 0817 11/04/16 0923 11/05/16 0417 11/06/16 0942 11/07/16 0530  NA 135 133*  --  133* 137 135  K 2.9* 3.0*  --  3.7 4.3 4.4  CL 107 103  --  105 107 104  CO2 21* 21*  --  20* 23 23  GLUCOSE 111* 96  --  94 98 98  BUN 20 7  --  10 10 11   CREATININE 0.62 0.65  --  0.71 0.80 0.98  CALCIUM 8.1* 8.8*  --  9.0 9.8 9.5  MG  --   --  1.4*  --  1.5*  --   ALKPHOS 68  --   --   --   --   --   AST 70*  --   --   --   --   --   ALT 36  --   --   --   --   --   ALBUMIN 3.1*  --   --   --   --   --      Results/Tests Pending at Time of Discharge: none  Discharge Medications:  Allergies as of 11/07/2016   No Known Allergies     Medication List    STOP taking these medications   citalopram 20 MG tablet Commonly known as:  CELEXA   ketoconazole 2 % cream Commonly known as:  NIZORAL   methocarbamol 750 MG tablet Commonly known as:  ROBAXIN   metoprolol succinate 25 MG 24 hr tablet Commonly known as:  TOPROL-XL   omeprazole 40 MG capsule Commonly known as:  PRILOSEC     TAKE these medications   acetaminophen 325 MG tablet Commonly known as:  TYLENOL Take 650 mg by mouth every 6 (six) hours as needed for mild pain.   aspirin EC 325 MG tablet Take 325 mg by mouth at bedtime as needed for mild pain. For pain   cyclobenzaprine 5 MG tablet Commonly known as:  FLEXERIL Take 1 tablet (5 mg total) by mouth 2 (two) times daily as needed for muscle  spasms. What changed:  Another medication with the same name was removed. Continue taking this medication, and follow the directions you see here.   ferrous sulfate 325 (65 FE) MG tablet Take 325 mg by mouth 2 (two) times a week.   hydrochlorothiazide 25 MG tablet Commonly known as:  HYDRODIURIL TAKE 1 TABLET BY MOUTH EVERY DAY   LORazepam 0.5 MG tablet Commonly known as:  ATIVAN Take 1 tablet (0.5 mg total) by mouth at bedtime as needed for anxiety.   multivitamin with minerals Tabs tablet Take 1 tablet by mouth 2 (two) times a week.   naproxen 500 MG tablet Commonly known as:  NAPROSYN Take 1 tablet (500 mg total) by mouth up to 2 (two) times daily with meals as needed for pain. Don't take this with Aspirn!   PRESERVISION AREDS 2 PO Take 1 capsule by mouth daily.   Rivaroxaban 15 MG Tabs tablet Commonly known as:  XARELTO Take 1 tablet (15 mg total) by mouth 2 (two) times daily with a meal.   thiamine 100 MG tablet Commonly known as:  VITAMIN B-1 Take 100 mg by mouth daily.   traMADol 50 MG tablet Commonly known as:  ULTRAM take 1 tablet by mouth every 8 hours if needed What changed:  See the new instructions.       Discharge Instructions: Please refer to Patient Instructions section of EMR for full details.  Patient was counseled important signs and symptoms that should prompt return to medical care, changes in medications, dietary instructions, activity restrictions, and follow up appointments.   Follow-Up Appointments: Follow-up Information    Everrett Coombe, MD Follow up on 11/10/2016.   Why:  Please go to [facility] clinic @ 1:30 for your hospital follow up appointment  Contact information: [place] [address] [phone]            Janaisa Birkland J Traycen Goyer, DO 11/09/2016, 6:52 AM PGY-1, [place]

## 2016-11-10 ENCOUNTER — Encounter: Payer: Self-pay | Admitting: Obstetrics and Gynecology

## 2016-11-10 ENCOUNTER — Ambulatory Visit (INDEPENDENT_AMBULATORY_CARE_PROVIDER_SITE_OTHER): Payer: Medicaid Other | Admitting: Obstetrics and Gynecology

## 2016-11-10 ENCOUNTER — Other Ambulatory Visit: Payer: Self-pay | Admitting: *Deleted

## 2016-11-10 VITALS — BP 122/64 | HR 96 | Temp 98.5°F | Ht 70.0 in | Wt 174.0 lb

## 2016-11-10 DIAGNOSIS — I2699 Other pulmonary embolism without acute cor pulmonale: Secondary | ICD-10-CM

## 2016-11-10 DIAGNOSIS — F1010 Alcohol abuse, uncomplicated: Secondary | ICD-10-CM | POA: Diagnosis not present

## 2016-11-10 DIAGNOSIS — Z09 Encounter for follow-up examination after completed treatment for conditions other than malignant neoplasm: Secondary | ICD-10-CM

## 2016-11-10 NOTE — Telephone Encounter (Addendum)
Received request for med reconciliation from Dr. Gerarda Fraction and [place]. Pharm D and students unavailable at this time. Patient brought grocery bag filled with current meds and garbage bag filled with prn meds and expired meds. Expired meds (sucralfate, erythromycin 0.5% eye ung, and neomycin-polym-dexameth eye suspension) removed for destruction.  Current med list updated. Patient counseled on the 3 different acetaminophens (regular, PM and cold)  as well as taking 1000 mg ASA with caffeine while on Xarelto. Patient states that if he is in pain he will continue taking this amount of aspirin. Patient was taking zyprexa 10 mg prn stomach pain. Discussed that this med is for mood disorders and should be taken daily qHS. Patient states he will start taking it this way. Patient is aware not to take naproxen and ibuprofen. States he takes one or the other but not both.  ROI signed and faxed to Dr. Avanell Shackleton [phone]) in [place], [place]. Ph [phone]  First available F/U appt made with PCP for 11/27/2016 at 2:15 pm  Patient requesting referral to eye doctor and dermatologist for previous skin cancer.   Requesting refill on methocarbamol 750 mg and something for stomach. States sulcrafate did not really help. Hubbard Hartshorn, RN, BSN

## 2016-11-10 NOTE — Patient Instructions (Signed)
Follow-up with PCP for back pain  Continue blood thinners. Call for refill of medication a week from running out  Try to decrease alcohol intake  Follow-up with PCP as needed for acute concerns

## 2016-11-10 NOTE — Progress Notes (Signed)
   Received request for med reconciliation from Dr. Gerarda Fraction and [place]. Pharm D and students unavailable at this time. Patient brought grocery bag filled with current meds and garbage bag filled with prn meds and expired meds. Expired meds (sucralfate, erythromycin 0.5% eye ung, and neomycin-polym-dexameth eye suspension) removed for destruction.  Current med list updated. Patient counseled on the 3 different acetaminophens (regular, PM and cold)  as well as taking 1000 mg ASA with caffeine while on Xarelto. Patient states that if he is in pain he will continue taking this amount of aspirin. Patient was taking zyprexa 10 mg prn stomach pain. Discussed that this med is for mood disorders and should be taken daily qHS. Patient states he will start taking it this way. Patient is aware not to take naproxen and ibuprofen. States he takes one or the other but not both.  ROI signed and faxed to Dr. Avanell Shackleton [phone]) in [place], [place]. Ph [phone]  First available F/U appt made with PCP for 11/27/2016 at 2:15 pm  Patient requesting referral to eye doctor and dermatologist for previous skin cancer.   Requesting refill on methocarbamol 750 mg and something for stomach. States sulcrafate did not really help. Hubbard Hartshorn, RN, BSN

## 2016-11-10 NOTE — Progress Notes (Signed)
     Subjective: Chief Complaint  Patient presents with  . Hospitalization Follow-up     HPI: Anthony [name] is a 61 y.o. presenting to clinic today for hospital discharge follow-up:  HPI: Patient was hospitalized from 11/01/2016 to 11/07/2016 with an acute PE.  PMH is significant for HTN, EtOH abuse, h/o skin cancer, chronic back pain. He also underwent precedex gtt in the ICU due to alcohol withdrawal with DTs during his hospital stay.  Patient now reports that he is breathing much better. Able to walk, climb stairs, and do his usual activities without difficulty breathing. He is taking his Xarelto as prescribed. Denies any bleeding. Still has large bruise and hematoma from hospitlizayion. Patient endorse that he continues to drink alcohol but drinks "light beer" now. Endorses 2 cans of beer a day.   ROS: See HPI.  Past Medical, Surgical, Social, and Family History Reviewed & Updated per EMR.    History  Smoking Status  . Never Smoker  Smokeless Tobacco  . Never Used    Objective: BP 122/64   Pulse 96   Temp 98.5 F (36.9 C) (Oral)   Ht 5\' 10"  (1.778 m)   Wt 174 lb (78.9 kg)   SpO2 99%   BMI 24.97 kg/m  Nursing notes and vitals reviewed.  Physical Exam General: well-appearing, NAD Cardiovascular: RRR, no murmurs Respiratory: CTAB, normal effort on room air Abdomen: soft, nontender, nondistended, + bowel sounds Extremities: warm and well perfused, no edema Skin: R arm with large area of ecchymosis in area of previous restraints showing signs of healing.   Assessment/Plan: 1. Hospital discharge follow-up Doing well since hospital discharge. No complaints voiced today. Med reconciliation had. Patient scheduled for follow-up with PCP. Please see separate note from RN as well.   2. Right pulmonary embolus (HCC) Saturating well on RA. Lung exam normal. No signs of respiratory distress. Continue Xarelto 15 mg twice daily (started on 11/07/16)for 21 days followed by 20  mg once daily (start on 11/28/16).  3. Alcohol abuse Continues to drink alcohol. Discussed alcohol cessation but patient is not ready or willing to quit at this time.   PATIENT EDUCATION PROVIDED: See AVS    Luiz Blare, DO 11/10/2016, 8:19 AM PGY-3, [place]

## 2016-11-11 ENCOUNTER — Telehealth: Payer: Self-pay | Admitting: Student

## 2016-11-11 DIAGNOSIS — I10 Essential (primary) hypertension: Secondary | ICD-10-CM

## 2016-11-11 MED ORDER — METHOCARBAMOL 750 MG PO TABS: 750.0000 mg | ORAL_TABLET | Freq: Three times a day (TID) | ORAL | 0 refills | Status: DC | PRN

## 2016-11-11 MED ORDER — HYDROCHLOROTHIAZIDE 25 MG PO TABS: 25.0000 mg | ORAL_TABLET | Freq: Every day | ORAL | 6 refills | Status: DC

## 2016-11-11 NOTE — Telephone Encounter (Signed)
Called to discuss about his refill request on his methocarbamol. He says he has been on this medication for long time for his chronic back pain. He says this is the only medicine that helped with his pain. He says he didn't see improvement with Naproxen. He is also on Xarelto for PE now so NSAID may not be a good option. He says he received injection to his back when he was up in [place].  I will refill his Methocarbamol for now and discuss about his pain when he sees me in clinic on 11/27/2016. We may discuss about referral to physical therapy or pain clinic. Doubt injection to his back is an option now while he is on Xarelto.  Also refilled his HCTZ.

## 2016-11-12 ENCOUNTER — Other Ambulatory Visit: Payer: Self-pay | Admitting: Student in an Organized Health Care Education/Training Program

## 2016-11-16 NOTE — Telephone Encounter (Signed)
LVM for pt to call the office. Dr. Cyndia Skeeters refilled is Mehtocarbamol for now and will discuss about his pain when he sees himin clinic in 11/27/2016. See note below. Also HCTZ refilled. Ottis Stain, CMA

## 2016-11-16 NOTE — Telephone Encounter (Signed)
Pt wants pain medication. He says he has a broken back and is in pain.  He has used naproxen but it didn't seem to work. He would like to get something else. He doesn't want to wait until May 11 to get the pain medicine. Please advise

## 2016-11-20 NOTE — Telephone Encounter (Signed)
Has apt with me 5/11. Will address this at that visit.  Thanks! Bretta Bang

## 2016-11-20 NOTE — Telephone Encounter (Signed)
Patient states that he would like to get an xray for his "broken ribs" and also to look at his back again.  Patient was under the impression that this was going to happen when he came in for his hospital follow up.  Will forward to MD to advise. Felipe Cabell,CMA

## 2016-11-23 ENCOUNTER — Other Ambulatory Visit: Payer: Self-pay | Admitting: Student

## 2016-11-23 DIAGNOSIS — H04129 Dry eye syndrome of unspecified lacrimal gland: Secondary | ICD-10-CM | POA: Insufficient documentation

## 2016-11-23 DIAGNOSIS — F1010 Alcohol abuse, uncomplicated: Secondary | ICD-10-CM

## 2016-11-23 DIAGNOSIS — D539 Nutritional anemia, unspecified: Secondary | ICD-10-CM

## 2016-11-23 HISTORY — DX: Dry eye syndrome of unspecified lacrimal gland: H04.129

## 2016-11-23 MED ORDER — FOLIC ACID 1 MG PO TABS: 1.0000 mg | ORAL_TABLET | ORAL | 11 refills | Status: DC

## 2016-11-23 MED ORDER — PRESERVISION AREDS 2 PO CAPS: 2.0000 | ORAL_CAPSULE | ORAL | 6 refills | Status: DC

## 2016-11-23 MED ORDER — VITAMIN B-1 100 MG PO TABS
100.0000 mg | ORAL_TABLET | ORAL | 11 refills | Status: DC
Start: 2016-11-23 — End: 2018-01-07

## 2016-11-23 NOTE — Telephone Encounter (Signed)
Refills sent to his pharmacy. 

## 2016-11-23 NOTE — Progress Notes (Signed)
Refilled his folate, Thiamine and AREDS 2.

## 2016-11-23 NOTE — Telephone Encounter (Signed)
Contacted pt to inform him of below and at the time he requested refills on his folic acid 1mg , vitamin B1 100mg , and  areds 2 soft gels which is for dry eyes.  I told him that it may be that the PCP waits until visit to review medications and he said that he doesn't see why he should have to run out of these they are not like pain medication. Routing to PCP to see about refilling these. Katharina Caper, April D, [place]

## 2016-11-27 ENCOUNTER — Encounter: Payer: Self-pay | Admitting: Student

## 2016-11-27 ENCOUNTER — Ambulatory Visit (INDEPENDENT_AMBULATORY_CARE_PROVIDER_SITE_OTHER): Payer: Medicaid Other | Admitting: Student

## 2016-11-27 VITALS — BP 128/86 | HR 91 | Temp 98.0°F | Ht 70.0 in | Wt 181.4 lb

## 2016-11-27 DIAGNOSIS — G8929 Other chronic pain: Secondary | ICD-10-CM | POA: Diagnosis not present

## 2016-11-27 DIAGNOSIS — R0602 Shortness of breath: Secondary | ICD-10-CM | POA: Diagnosis present

## 2016-11-27 DIAGNOSIS — D539 Nutritional anemia, unspecified: Secondary | ICD-10-CM | POA: Diagnosis not present

## 2016-11-27 DIAGNOSIS — M545 Low back pain: Secondary | ICD-10-CM | POA: Diagnosis not present

## 2016-11-27 DIAGNOSIS — I2699 Other pulmonary embolism without acute cor pulmonale: Secondary | ICD-10-CM | POA: Diagnosis not present

## 2016-11-27 DIAGNOSIS — R0600 Dyspnea, unspecified: Secondary | ICD-10-CM | POA: Diagnosis not present

## 2016-11-27 DIAGNOSIS — L570 Actinic keratosis: Secondary | ICD-10-CM

## 2016-11-27 DIAGNOSIS — I10 Essential (primary) hypertension: Secondary | ICD-10-CM

## 2016-11-27 DIAGNOSIS — R0609 Other forms of dyspnea: Secondary | ICD-10-CM | POA: Insufficient documentation

## 2016-11-27 HISTORY — DX: Actinic keratosis: L57.0

## 2016-11-27 MED ORDER — HYDROCHLOROTHIAZIDE 25 MG PO TABS: 25.0000 mg | ORAL_TABLET | Freq: Every day | ORAL | 6 refills | Status: DC

## 2016-11-27 MED ORDER — OLANZAPINE 10 MG PO TABS: 10.0000 mg | ORAL_TABLET | Freq: Every day | ORAL | 3 refills | Status: DC

## 2016-11-27 MED ORDER — RIVAROXABAN 20 MG PO TABS: 20.0000 mg | ORAL_TABLET | Freq: Every day | ORAL | 0 refills | Status: DC

## 2016-11-27 NOTE — Assessment & Plan Note (Signed)
Unclear if this is due to his PE. Doubt another PE while on Xarelto. He compliant with this medication. He has bilateral edema but no JVD, JVR or crackles. No chest pain. He has macrocytic anemia which could contribute to this. TSH was within normal limit. -CMP, CBC with diff and BNP today.

## 2016-11-27 NOTE — Assessment & Plan Note (Signed)
Over his forehead and temporal areas. He has been followed by dermatologist in [place] before he moved back to [place]. Will order refer to dermatologist. He also has seborrheic dermatitis over his face.

## 2016-11-27 NOTE — Progress Notes (Signed)
Subjective:    Anthony [name] is a 61 y.o. old male here for back pain and shortness of breath   HPI Back pain: this is a chronic issue. Fell off two storyhouse roof while he was cleaning the gutters and sustained compression fracture of L3, L4 and L5 and fracture of right transverse process at L3 and nondisplaced fracture of left glenoid rim many years ago. He had multiple imaging in [place] including Lumbar MRI in 2016 which showed moderate to severe spinal canal stenosis at L3/L4 and mild canal and foraminal stenosis at other lumbar regions (see under media section). Patient describes his pain as achy over his lower back. Denies radiation to his legs or backside. Denies numbness or tingling in his legs or feet. Pain is basically unchanged. He denies fever, chills, bowel or bladder issue, saddle anesthesia, night sweat, unintentional weight loss or appetite changes. He has been taking multiple NSAIDs including naproxen, ibuprofen, ASA 325 with caffeine. He is also on Robaxin. He says Robaxin and tylenol helps with the pain. Off note, he was recently diagnosed with PE and started on Xarelto.   Shortness of breath: he recently started brisk walking and felt short of breath. He says he has not walked since he was diagnosed with PE about a month ago. He is on Xarelto. He reports good compliance with this medication. He brought his medication bottles to this visit. He has 3 pills of the starter pack left. He denies chest pain, cough, fever, orthopnea, PND or hemoptysis. He admits bilateral leg swelling. He also has history of macrocytic anemia likely due to alcohol use disorder. He went in to DT at his recent hospitalization for PE. He is on folic acid and vit B1.   Skin rash: reports skin rash on his face and head that has been there for long time. He says these were monitored by his dermatologist in [place] so that they don't turn in to skin cancer. He denies significant sun exposure. He likes to have a  referral to dermatologist.    PMH/Problem List: has Alcohol abuse; Chronic low back pain; HTN (hypertension); Ulnar neuropathy at wrist; Seborrheic dermatitis of scalp; Trigger finger, acquired; Lower back pain; Tinea versicolor; Poor social situation; Toe pain; Skin cancer; Hiatal hernia; Right pulmonary embolus ([place]); Acute renal failure ([place]); Increased anion gap metabolic acidosis; Macrocytic anemia; Dry eye; Dyspnea; and Actinic keratosis on his problem list.   has a past medical history of Hydrocele; Hypertension; Lumbar compression fracture ([place]); and Skin cancer.  FH:  Family History  Problem Relation Age of Onset  . Heart attack Father        died at 90 years  . Dementia Father   . Stroke Father   . Cancer Sister     [facility] Social History  Substance Use Topics  . Smoking status: Never Smoker  . Smokeless tobacco: Never Used  . Alcohol use 25.2 oz/week    42 Cans of beer per week     Comment: quit 04/2016    Review of Systems Review of systems negative except for pertinent positives and negatives in history of present illness above.     Objective:     Vitals:   11/27/16 1404  BP: 128/86  Pulse: 91  Temp: 98 F (36.7 C)  TempSrc: Oral  SpO2: 99%  Weight: 181 lb 6.4 oz (82.3 kg)  Height: 5\' 10"  (1.778 m)    Physical Exam GEN: appears anxious, no apparent distress. Head: normocephalic and atraumatic.  Eyes: conjunctiva  without injection, sclera anicteric Nares: no rhinorrhea, congestion or erythema Oropharynx: mmm without erythema or exudation HEM: negative for cervical or periauricular lymphadenopathies CVS: RRR, nl S1&S2, no murmurs, no JVD or HJR, 1+ pitting edema to his lower third ot his legs bilaterally,  2+ DP & PT pulses bilaterally RESP: speaks in full sentence, no IWOB, good air movement bilaterally, CTAB GI: BS present & normal, soft, NTND, no guarding, no rebound MSK:  Back:  Normal skin, spine with normal alignment and no deformity.    No step  offs, some tenderness to palpation over paraspinous muscles but none over his spines.  ROM is full at lumbar sacral regions  Special tests: lying and seated SLR negative  Neuro exam in LE: motor 5/5 in all muscle groups, light sensation intact in L4-S1 dermatomes, 1+ patellar and Achille reflexes bilaterally  SKIN: noted scaly skin lesions over his forehead and his temporal areas.  NEURO: alert and oiented appropriately. The rest as above. PSYCH: appears anxious, hardly focused.     Assessment and Plan:  Lower back pain This is a chronic issue. He has history of L3-L5 compression fractures per his chart and imaging reports from [place]. No recent changes to his pain. No red flags. Neuro exam within normal limit. Given the severity of his lumbar stenosis based on his old MRI and history of trauma, I will refer him to neurosurgery for further evaluation and imaging. Recommend against NSAID use while on Xarelto. Collected his Ibuprofen, Naproxen and ASA bottles and gave to RN for disposal. Tylenol and Robaxin are helping.   Dyspnea Unclear if this is due to his PE. Doubt another PE while on Xarelto. He compliant with this medication. He has bilateral edema but no JVD, JVR or crackles. No chest pain. He has macrocytic anemia which could contribute to this. TSH was within normal limit. -CMP, CBC with diff and BNP today.   Actinic keratosis Over his forehead and temporal areas. He has been followed by dermatologist in [place] before he moved back to [place]. Will order refer to dermatologist. He also has seborrheic dermatitis over his face.   Right pulmonary embolus (HCC) Got three pills of 15 mg Xarelto starter pack left. Sent new prescription for Xarelto 20 mg once daily. Understands that he takes one tablet a day.   Macrocytic anemia -CBC with diff. -Continue folate and Vit B1.  -Advised to quit drinking. He is still in precontemplation stage.   Orders Placed This Encounter    Procedures  . Brain natriuretic peptide  . Comprehensive metabolic panel  . CBC with Differential  . Ambulatory referral to Neurosurgery    Referral Priority:   Routine    Referral Type:   Surgical    Referral Reason:   Specialty Services Required    Requested Specialty:   Neurosurgery    Number of Visits Requested:   1  . Ambulatory referral to Dermatology    Referral Priority:   Routine    Referral Type:   Consultation    Referral Reason:   Specialty Services Required    Requested Specialty:   Dermatology    Number of Visits Requested:   1    Mercy Riding, MD 11/27/16 Pager: [phone]

## 2016-11-27 NOTE — Patient Instructions (Signed)
It was great seeing you today! We have addressed the following issues today 1. Back pain: I refilled her prescription for Robaxin. You can also take Tylenol along with that. Avoid taking other over-the-counter medications such as ibuprofen, Motrin, Naproxen, Aleve e.t.c. I also recommend trying ice or heat as needed.  2.   Shortness of breath: We'll get some blood tests. Someone will get in touch with you if the blood tests are abnormal. I recommend elevating the legs when you sit. You can also try compression stocking which is available at drug store.   3.  Pulmonary embolism: I have refilled his surgery also. You will be taking 20 mg daily.   4. Colon cancer screening: Please call one of the numbers below and schedule for your colonoscopy  If we did any lab work today, and the results require attention, either me or my nurse will get in touch with you. If everything is normal, you will get a letter in mail and a message via . If you don't hear from [place] in two weeks, please give [place] a call. Otherwise, we look forward to seeing you again at your next visit. If you have any questions or concerns before then, please call the clinic at [phone].  Please bring all your medications to every doctors visit  Sign up for My Chart to have easy access to your labs results, and communication with your Primary care physician.    Please check-out at the front desk before leaving the clinic.    Take Care,   Dr. Cyndia Skeeters             Colon Cancer  People with early colon cancer usually have no warning signs or symptoms.  If found early, most patients can be cured, but if found when it has already spread, the chance of survival is not as good.  Colon cancer is the second most common cause of concern is in the [place] with over 56,000 deaths from colon cancer in 2005  Colon cancer is a common, treatable disease. Screening tests can find a cancer  early, before you have symptoms, and make it more likely that  you will survive the disease.  Who needs to be tested? If you are age 4-75 yrs, you should be tested for colon cancer.  Ways to be tested:  A colonoscopy the best test to detect colon cancer. It requires you to drink a bowel preparation to clean out your colon before the test. During this test, a tube with a camera inserted into your rectum and examines your entire colon. You can be given medicine to make you sleepy during the exam. Therefore, you will not be able to drive immediately after the test. There is a small risk of bowel injury during the test.   Stool cards that you can take home and take a sample of your stool is another option. The cards are not as good as colonoscopy at detecting cancer, but the tests are easier and cheaper.   To schedule the colonoscopy, you can call one of the 3 options below:  [facility]. Phone number: [phone]  [facility]. Phone number: [phone]  [facility]: Phone number [phone]

## 2016-11-27 NOTE — Assessment & Plan Note (Signed)
-  CBC with diff. -Continue folate and Vit B1.  -Advised to quit drinking. He is still in precontemplation stage.

## 2016-11-27 NOTE — Assessment & Plan Note (Signed)
Got three pills of 15 mg Xarelto starter pack left. Sent new prescription for Xarelto 20 mg once daily. Understands that he takes one tablet a day.

## 2016-11-27 NOTE — Assessment & Plan Note (Addendum)
This is a chronic issue. He has history of L3-L5 compression fractures per his chart and imaging reports from [place]. No recent changes to his pain. No red flags. Neuro exam within normal limit. Given the severity of his lumbar stenosis based on his old MRI and history of trauma, I will refer him to neurosurgery for further evaluation and imaging. Recommend against NSAID use while on Xarelto. Collected his Ibuprofen, Naproxen and ASA bottles and gave to RN for disposal. Tylenol and Robaxin are helping.

## 2016-11-28 LAB — COMPREHENSIVE METABOLIC PANEL
ALT: 22 IU/L (ref 0–44)
AST: 36 IU/L (ref 0–40)
Albumin/Globulin Ratio: 1.5 (ref 1.2–2.2)
Albumin: 4.1 g/dL (ref 3.6–4.8)
Alkaline Phosphatase: 70 IU/L (ref 39–117)
BUN/Creatinine Ratio: 12 (ref 10–24)
BUN: 9 mg/dL (ref 8–27)
Bilirubin Total: 0.3 mg/dL (ref 0.0–1.2)
CALCIUM: 9.3 mg/dL (ref 8.6–10.2)
CO2: 20 mmol/L (ref 18–29)
CREATININE: 0.75 mg/dL — AB (ref 0.76–1.27)
Chloride: 104 mmol/L (ref 96–106)
GFR calc Af Amer: 114 mL/min/{1.73_m2} (ref >59)
GFR, EST NON AFRICAN AMERICAN: 99 mL/min/{1.73_m2} (ref >59)
GLOBULIN, TOTAL: 2.7 g/dL (ref 1.5–4.5)
Glucose: 81 mg/dL (ref 65–99)
Potassium: 4.2 mmol/L (ref 3.5–5.2)
Sodium: 138 mmol/L (ref 134–144)
TOTAL PROTEIN: 6.8 g/dL (ref 6.0–8.5)

## 2016-11-28 LAB — CBC WITH DIFFERENTIAL/PLATELET
BASOS: 1 %
Basophils Absolute: 0.1 10*3/uL (ref 0.0–0.2)
EOS (ABSOLUTE): 0.4 10*3/uL (ref 0.0–0.4)
EOS: 6 %
HEMATOCRIT: 32.3 % — AB (ref 37.5–51.0)
HEMOGLOBIN: 10.3 g/dL — AB (ref 13.0–17.7)
IMMATURE GRANS (ABS): 0 10*3/uL (ref 0.0–0.1)
Immature Granulocytes: 0 %
LYMPHS: 37 %
Lymphocytes Absolute: 2.2 10*3/uL (ref 0.7–3.1)
MCH: 33.6 pg — ABNORMAL HIGH (ref 26.6–33.0)
MCHC: 31.9 g/dL (ref 31.5–35.7)
MCV: 105 fL — AB (ref 79–97)
Monocytes Absolute: 0.7 10*3/uL (ref 0.1–0.9)
Monocytes: 12 %
NEUTROS PCT: 44 %
Neutrophils Absolute: 2.6 10*3/uL (ref 1.4–7.0)
Platelets: 275 10*3/uL (ref 150–379)
RBC: 3.07 x10E6/uL — ABNORMAL LOW (ref 4.14–5.80)
RDW: 14.5 % (ref 12.3–15.4)
WBC: 5.9 10*3/uL (ref 3.4–10.8)

## 2016-11-28 LAB — BRAIN NATRIURETIC PEPTIDE: BNP: 70.2 pg/mL (ref 0.0–100.0)

## 2016-12-01 ENCOUNTER — Telehealth: Payer: Self-pay | Admitting: Student

## 2016-12-01 NOTE — Telephone Encounter (Signed)
Pt feels he needs to be seen more often than every 3 months. He says he has so many medical issues that needs to see dr  more frequently. He wants to know why he doesn't he isnt seeing an oncologiist, eye dr. Halford Decamp specialiist.

## 2016-12-02 NOTE — Telephone Encounter (Signed)
Attempted to call patient. Patient did not answer. Doesn't have voicemail either. Patient already have a referral to dermatologist for skin issue and neurosurgery for his back. He can call the front desk office and schedule an appointment if he has other issues to discuss.  Thanks! Bretta Bang

## 2016-12-06 ENCOUNTER — Encounter: Payer: Self-pay | Admitting: Student

## 2016-12-06 NOTE — Progress Notes (Signed)
Your hemoglobin level is low but better than what it has been. I recommend you continue taking your folic acid and vitamin B12 or multivitamin. I also recommend against drinking alcohol.   Your other test results are normal.  I have also sent a referral to neurosurgeon for you back and dermatologist for your skin.  I tried to call you but you have no voice mail to leave a message.   Thank you! Dr. Cyndia Skeeters

## 2016-12-07 NOTE — Telephone Encounter (Signed)
Tried to contact pt about below, no answer and no VM. Checked appointment desk and pt has appointment scheduled on 12/18/2016 with Dr. Cyndia Skeeters so nothing further needs to be done. Katharina Caper, April D, [place]

## 2016-12-18 ENCOUNTER — Encounter: Payer: Self-pay | Admitting: Student

## 2016-12-18 ENCOUNTER — Ambulatory Visit (INDEPENDENT_AMBULATORY_CARE_PROVIDER_SITE_OTHER): Payer: Medicaid Other | Admitting: Student

## 2016-12-18 VITALS — BP 120/80 | HR 109 | Temp 98.1°F | Ht 70.0 in | Wt 184.6 lb

## 2016-12-18 DIAGNOSIS — Z23 Encounter for immunization: Secondary | ICD-10-CM

## 2016-12-18 DIAGNOSIS — D539 Nutritional anemia, unspecified: Secondary | ICD-10-CM | POA: Diagnosis not present

## 2016-12-18 DIAGNOSIS — F1010 Alcohol abuse, uncomplicated: Secondary | ICD-10-CM

## 2016-12-18 DIAGNOSIS — I2699 Other pulmonary embolism without acute cor pulmonale: Secondary | ICD-10-CM | POA: Diagnosis present

## 2016-12-18 DIAGNOSIS — L570 Actinic keratosis: Secondary | ICD-10-CM

## 2016-12-18 MED ORDER — ACETAMINOPHEN ER 650 MG PO TBCR: 650.0000 mg | EXTENDED_RELEASE_TABLET | Freq: Three times a day (TID) | ORAL | 2 refills | Status: DC | PRN

## 2016-12-18 MED ORDER — VITAMIN B-12 1000 MCG PO TABS: 1000.0000 ug | ORAL_TABLET | Freq: Every day | ORAL | 0 refills | Status: DC

## 2016-12-18 NOTE — Progress Notes (Signed)
Subjective:    Anthony [name] is a 61 y.o. old male here to discuss about his PE and skin rash  HPI PE: patient was recently hospitalized and found to have PE. He was started on Xarelto starter pack. He is concerned that Xarelto may not dissolve the clot in his lungs. He also reports skipping his Xarelto doses a couple of times when he took NSAIDs for his chronic back pain because he was told not to take NSAIDs with Xarelto. He denies chest pain, shortness of breath or swelling or tenderness in his legs.  Skin rash: reports having chronic skin rash on his arms and face. He says he had history of skin cancer on his left hand that was excised in the past. He was told that he is at risk of skin cancer from the skin rashes on his arms.   Alcohol: patient had DT when he was recently hospitalized for PE. He reports cutting down on his drinks to 2 of the 8 oz beers a day but admits drinking more than that sometimes. He says he has nothing to do around his friends who drinks alcohol. He voices understanding the impact of this on his health but doesn't want to quit.   PMH/Problem List: has Alcohol abuse; Chronic low back pain; HTN (hypertension); Ulnar neuropathy at wrist; Seborrheic dermatitis of scalp; Trigger finger, acquired; Lower back pain; Tinea versicolor; Poor social situation; Toe pain; Skin cancer; Hiatal hernia; Right pulmonary embolus ([name]); Acute renal failure ([name]); Macrocytic anemia; Dry eye; Dyspnea; and Actinic keratosis on his problem list.   has a past medical history of Hydrocele; Hypertension; Lumbar compression fracture ([name]); and Skin cancer.  FH:  Family History  Problem Relation Age of Onset  . Heart attack Father        died at 95 years  . Dementia Father   . Stroke Father   . Cancer Sister     [name] Social History  Substance Use Topics  . Smoking status: Never Smoker  . Smokeless tobacco: Never Used  . Alcohol use 25.2 oz/week    42 Cans of beer per week     Comment: quit  04/2016    Review of Systems Review of systems negative except for pertinent positives and negatives in history of present illness above.     Objective:     Vitals:   12/18/16 1350  BP: 120/80  Pulse: (!) 109  Temp: 98.1 F (36.7 C)  TempSrc: Oral  SpO2: 97%  Weight: 184 lb 9.6 oz (83.7 kg)  Height: 5\' 10"  (1.778 m)    Physical Exam GEN: appears well, no apparent distress. Oropharynx: poor dentition, no apparent lesion.  CVS: RRR, nl S1&S2, no murmurs, no edema RESP: no IWOB, good air movement bilaterally, CTAB GI: BS present & normal, soft, NTND MSK: no focal tenderness or notable swelling SKIN: scattered erythematous macules over the extensor surface his arms. None on his palms or oral mucosa. See pictures.    NEURO: alert and oiented appropriately, no gross defecits  PSYCH: appropriately dressed. Sometimes not able to maintain train of thought and concentrate on the questions. Cognitive ability may be low average.   Assessment and Plan:  Right pulmonary embolus [name]) Emphasized about the importance of adherence with his Xarelto. Xarelto might not dissolve the clot but prevents new clot formation and the exsisting clot from getting bigger. Skipping dose or stopping Xarelto before he completes the treatment course would increase his risk of thrombosis. Advised him to take tylenol  for pain and avoid NSAIDs. He is also on Robaxin. He has an upcoming appointment with neurosurgery for his chronic back pain in two weeks.   Actinic keratosis Has already placed referral to dermatology when I saw him at his last visit with me.  Alcohol abuse Discussed the impact of this on his overall health. Advised him to quit or stay below 2 of the 8 oz beers or equivalent in a day. Unfortunately, he is not willing.  Received his PPSV-23 today.   Macrocytic anemia Hgb 10.3. MCV 105%. Likely due to alcohol abuse.  Will continue folic acid. Added Vitamin B12 today.  Gave a number to  schedule for colonoscopy last visit about three weeks ago.   Orders Placed This Encounter  Procedures  . Pneumococcal polysaccharide vaccine 23-valent greater than or equal to 2yo subcutaneous/IM   Return in about 1 year (around 12/18/2017) for For annual physical.  Mercy Riding, MD 12/18/16 Pager: [phone]

## 2016-12-18 NOTE — Assessment & Plan Note (Addendum)
Hgb 10.3. MCV 105%. Likely due to alcohol abuse.  Will continue folic acid. Added Vitamin B12 today.  Gave a number to schedule for colonoscopy last visit about three weeks ago.

## 2016-12-18 NOTE — Assessment & Plan Note (Addendum)
Emphasized about the importance of adherence with his Xarelto. Xarelto might not dissolve the clot but prevents new clot formation and the exsisting clot from getting bigger. Skipping dose or stopping Xarelto before he completes the treatment course would increase his risk of thrombosis. Advised him to take tylenol for pain and avoid NSAIDs. He is also on Robaxin. He has an upcoming appointment with neurosurgery for his chronic back pain in two weeks.

## 2016-12-18 NOTE — Assessment & Plan Note (Signed)
Has already placed referral to dermatology when I saw him at his last visit with me.

## 2016-12-18 NOTE — Patient Instructions (Signed)
It was great seeing you today! We have addressed the following issues today 1. Blood clots: I strongly recommend you take his Crestor as prescribed. Avoid over-the-counter pain medications such as ibuprofen,  Naproxen, Advil and Aleve e.t.c at those medication could interfere with your blood thinner medicine 2.   Skin rash: I have already sent a referral to dermatologist. Someone will get in touch with you about this.  3.   Pain: Continue Tylenol and Robaxin as needed for pain  4.   Alcohol: I strongly recommend you cut down further to 1 oz of beer a day.   If we did any lab work today, and the results require attention, either me or my nurse will get in touch with you. If everything is normal, you will get a letter in mail and a message via . If you don't hear from [place] in two weeks, please give [place] a call. Otherwise, we look forward to seeing you again at your next visit. If you have any questions or concerns before then, please call the clinic at [phone].  Please bring all your medications to every doctors visit  Sign up for My Chart to have easy access to your labs results, and communication with your Primary care physician.    Please check-out at the front desk before leaving the clinic.    Take Care,   Dr. Cyndia Skeeters

## 2016-12-18 NOTE — Assessment & Plan Note (Addendum)
Discussed the impact of this on his overall health. Advised him to quit or stay below 2 of the 8 oz beers or equivalent in a day. Unfortunately, he is not willing.  Received his PPSV-23 today.

## 2017-01-04 ENCOUNTER — Other Ambulatory Visit: Payer: Self-pay | Admitting: *Deleted

## 2017-01-04 DIAGNOSIS — D539 Nutritional anemia, unspecified: Secondary | ICD-10-CM

## 2017-01-04 MED ORDER — VITAMIN B-12 1000 MCG PO TABS: 1000.0000 ug | ORAL_TABLET | Freq: Every day | ORAL | 0 refills | Status: DC

## 2017-01-04 NOTE — Telephone Encounter (Signed)
Refill request for 90 day supply.  Aaliya Maultsby L, RN  

## 2017-01-09 ENCOUNTER — Other Ambulatory Visit: Payer: Self-pay | Admitting: Student

## 2017-01-13 ENCOUNTER — Other Ambulatory Visit: Payer: Self-pay | Admitting: Neurosurgery

## 2017-01-13 DIAGNOSIS — M4726 Other spondylosis with radiculopathy, lumbar region: Secondary | ICD-10-CM

## 2017-01-29 ENCOUNTER — Other Ambulatory Visit: Payer: Medicaid Other

## 2017-02-01 ENCOUNTER — Ambulatory Visit
Admission: RE | Admit: 2017-02-01 | Discharge: 2017-02-01 | Disposition: A | Discharge status: Home / Self Care | Payer: Medicaid Other | Source: Ambulatory Visit | Attending: Neurosurgery | Admitting: Neurosurgery

## 2017-02-01 ENCOUNTER — Other Ambulatory Visit: Payer: Self-pay | Admitting: Internal Medicine

## 2017-02-01 DIAGNOSIS — D539 Nutritional anemia, unspecified: Secondary | ICD-10-CM

## 2017-02-01 DIAGNOSIS — M4726 Other spondylosis with radiculopathy, lumbar region: Secondary | ICD-10-CM

## 2017-02-11 ENCOUNTER — Other Ambulatory Visit: Payer: Medicaid Other

## 2017-02-16 ENCOUNTER — Encounter (HOSPITAL_COMMUNITY): Payer: Self-pay | Admitting: *Deleted

## 2017-02-16 ENCOUNTER — Emergency Department (HOSPITAL_COMMUNITY)
Admission: EM | Admit: 2017-02-16 | Discharge: 2017-02-17 | Disposition: A | Discharge status: Home / Self Care | Payer: Medicaid Other | Attending: Emergency Medicine | Admitting: Emergency Medicine

## 2017-02-16 DIAGNOSIS — I10 Essential (primary) hypertension: Secondary | ICD-10-CM | POA: Diagnosis not present

## 2017-02-16 DIAGNOSIS — F1010 Alcohol abuse, uncomplicated: Secondary | ICD-10-CM | POA: Diagnosis not present

## 2017-02-16 DIAGNOSIS — Z7901 Long term (current) use of anticoagulants: Secondary | ICD-10-CM | POA: Insufficient documentation

## 2017-02-16 DIAGNOSIS — Z79899 Other long term (current) drug therapy: Secondary | ICD-10-CM | POA: Diagnosis not present

## 2017-02-16 DIAGNOSIS — F329 Major depressive disorder, single episode, unspecified: Secondary | ICD-10-CM | POA: Diagnosis present

## 2017-02-16 DIAGNOSIS — R45851 Suicidal ideations: Secondary | ICD-10-CM | POA: Diagnosis not present

## 2017-02-16 DIAGNOSIS — F1020 Alcohol dependence, uncomplicated: Secondary | ICD-10-CM | POA: Diagnosis present

## 2017-02-16 DIAGNOSIS — G8929 Other chronic pain: Secondary | ICD-10-CM | POA: Diagnosis not present

## 2017-02-16 LAB — COMPREHENSIVE METABOLIC PANEL
ALK PHOS: 88 U/L (ref 38–126)
ALT: 33 U/L (ref 17–63)
ANION GAP: 15 (ref 5–15)
AST: 56 U/L — ABNORMAL HIGH (ref 15–41)
Albumin: 3.6 g/dL (ref 3.5–5.0)
BILIRUBIN TOTAL: 0.4 mg/dL (ref 0.3–1.2)
BUN: 10 mg/dL (ref 6–20)
CALCIUM: 8.4 mg/dL — AB (ref 8.9–10.3)
CO2: 21 mmol/L — ABNORMAL LOW (ref 22–32)
Chloride: 108 mmol/L (ref 101–111)
Creatinine, Ser: 0.61 mg/dL (ref 0.61–1.24)
GFR calc non Af Amer: >60 mL/min (ref >60)
Glucose, Bld: 95 mg/dL (ref 65–99)
POTASSIUM: 3.7 mmol/L (ref 3.5–5.1)
SODIUM: 144 mmol/L (ref 135–145)
TOTAL PROTEIN: 6.9 g/dL (ref 6.5–8.1)

## 2017-02-16 LAB — CBC
HCT: 35.4 % — ABNORMAL LOW (ref 39.0–52.0)
Hemoglobin: 11.8 g/dL — ABNORMAL LOW (ref 13.0–17.0)
MCH: 30.9 pg (ref 26.0–34.0)
MCHC: 33.3 g/dL (ref 30.0–36.0)
MCV: 92.7 fL (ref 78.0–100.0)
PLATELETS: 418 10*3/uL — AB (ref 150–400)
RBC: 3.82 MIL/uL — ABNORMAL LOW (ref 4.22–5.81)
RDW: 15.1 % (ref 11.5–15.5)
WBC: 6.6 10*3/uL (ref 4.0–10.5)

## 2017-02-16 LAB — RAPID URINE DRUG SCREEN, HOSP PERFORMED
Amphetamines: NOT DETECTED
Barbiturates: NOT DETECTED
Benzodiazepines: NOT DETECTED
COCAINE: NOT DETECTED
OPIATES: NOT DETECTED
Tetrahydrocannabinol: NOT DETECTED

## 2017-02-16 LAB — ETHANOL: Alcohol, Ethyl (B): 477 mg/dL (ref <5)

## 2017-02-16 LAB — ACETAMINOPHEN LEVEL

## 2017-02-16 LAB — SALICYLATE LEVEL

## 2017-02-16 MED ORDER — ACETAMINOPHEN 325 MG PO TABS
650.0000 mg | ORAL_TABLET | Freq: Once | ORAL | Status: AC
  Administered 2017-02-16: 650 mg via ORAL
  Filled 2017-02-16: qty 2

## 2017-02-16 NOTE — ED Triage Notes (Signed)
Per EMS, pt with SI, states he wants to hang himself. Pt states he has been drinking a lot, does not know how much. Pt smelled of alcohol.   BP 150/110 HR 130 CBG 134

## 2017-02-16 NOTE — ED Notes (Signed)
Bed: [facility] Expected date:  Expected time:  Means of arrival:  Comments: EMS 61 yo male SI-currently denies to EMS/ETOH CBG 134 HR 130

## 2017-02-16 NOTE — ED Notes (Signed)
Patient given bottle of water.

## 2017-02-16 NOTE — ED Notes (Signed)
Pt is dressed out pt has in his belongings bag a pair of blue jeans and a red plaid shirt.

## 2017-02-16 NOTE — ED Provider Notes (Signed)
[facility] DEPT Provider Note   CSN: [id number] Arrival date & time: 02/16/17  2048     History   Chief Complaint Chief Complaint  Patient presents with  . Suicidal  . Alcohol Intoxication    HPI Anthony [name] is a 61 y.o. male.  HPI patient presents due to concern of suicidal ideation. Patient knowledge is a history of depression, alcohol abuse. He drinks substantially, daily today, with thoughts of killing himself, he was referred here for evaluation. He told EMS providers that he wants to hang himself. To me the patient states that he is unwilling to commit to a plan of suicide, but states that he is tired of living. He denies physical pain beyond chronic back pain, which is unchanged, sore, midline, mid back. He typically takes Tylenol for pain relief, and has not had Tylenol recently. .   Past Medical History:  Diagnosis Date  . Hydrocele    Surgically corrected  . Hypertension   . Lumbar compression fracture ([place])   . Skin cancer    exciced 2017    Patient Active Problem List   Diagnosis Date Noted  . Dyspnea 11/27/2016  . Actinic keratosis 11/27/2016  . Macrocytic anemia 11/23/2016  . Dry eye 11/23/2016  . Right pulmonary embolus ([place]) 11/02/2016  . Acute renal failure ([place])   . Hiatal hernia 09/14/2016  . Skin cancer 08/13/2016  . Toe pain 07/07/2013  . Poor social situation 06/09/2013  . Tinea versicolor 06/08/2013  . Lower back pain 05/07/2013  . Trigger finger, acquired 11/18/2012  . 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 HTN (hypertension) 04/11/2012  . Alcohol abuse 09/16/2006  . Chronic low back pain 09/16/2006    Past Surgical History:  Procedure Laterality Date  . HIATAL HERNIA REPAIR  2016  . HYDROCELE EXCISION / REPAIR  2010  . SKIN CANCER EXCISION Left    Excised 2017       Home Medications    Prior to Admission medications   Medication Sig Start Date End Date Taking?  Authorizing Provider  acetaminophen (TYLENOL 8 HOUR) 650 MG CR tablet Take 1 tablet (650 mg total) by mouth every 8 (eight) hours as needed for pain. 12/18/16   Mercy Riding, MD  calcium carbonate (TUMS - DOSED IN MG ELEMENTAL CALCIUM) 500 MG chewable tablet Chew 2 tablets by mouth daily as needed for indigestion or heartburn.    [provider]  [facility] VITAMIN B12 1000 MCG tablet TAKE 1 TABLET BY MOUTH EVERY DAY 02/01/17   Mercy Riding, MD  Diphenhydramine-APAP 25-500 MG TABS Take 2 tablets by mouth at bedtime.    [provider]  ferrous sulfate 325 (65 FE) MG tablet Take 325 mg by mouth 2 (two) times a week.    [provider]  folic acid (FOLVITE) 1 MG tablet Take 1 tablet (1 mg total) by mouth 2 (two) times a week. 11/23/16   Mercy Riding, MD  hydrochlorothiazide (HYDRODIURIL) 25 MG tablet Take 1 tablet (25 mg total) by mouth daily. 11/27/16   Mercy Riding, MD  hydroxypropyl methylcellulose / hypromellose (ISOPTO TEARS / GONIOVISC) 2.5 % ophthalmic solution Place 1 drop into both eyes as needed for dry eyes.    [provider]  ketoconazole (NIZORAL) 2 % cream Apply 1 application topically 2 (two) times a week.    [provider]  ketoconazole (NIZORAL) 2 % shampoo Apply 1 application topically 2 (two) times  a week.    [provider]  methocarbamol (ROBAXIN) 750 MG tablet TAKE 1 TABLET (750 MG TOTAL) BY MOUTH EVERY 8 (EIGHT) HOURS AS NEEDED. 01/12/17   Mercy Riding, MD  Multiple Vitamin (MULTIVITAMIN WITH MINERALS) TABS tablet Take 1 tablet by mouth 2 (two) times a week.    [provider]  Multiple Vitamins-Minerals (PRESERVISION AREDS 2) CAPS Take 2 capsules by mouth 2 (two) times a week. 11/23/16   Mercy Riding, MD  Nutritional Supplements (GLUCERNA MEAL REPLACEMENT PO) Take 1 Bottle by mouth 2 (two) times a week.    [provider]  OLANZapine (ZYPREXA) 10 MG tablet Take 1 tablet (10 mg total) by mouth at bedtime. 11/27/16    Mercy Riding, MD  Rivaroxaban (XARELTO) 20 MG TABS tablet Take 1 tablet (20 mg total) by mouth daily with supper. 11/27/16   Mercy Riding, MD  simethicone (MYLICON) 106 MG chewable tablet Chew 125 mg by mouth every 6 (six) hours as needed for flatulence.    [provider]  thiamine (VITAMIN B-1) 100 MG tablet Take 1 tablet (100 mg total) by mouth 2 (two) times a week. 11/23/16   Mercy Riding, MD    Family History Family History  Problem Relation Age of Onset  . Heart attack Father        died at 55 years  . Dementia Father   . Stroke Father   . Cancer Sister     Social History Social History  Substance Use Topics  . Smoking status: Never Smoker  . Smokeless tobacco: Never Used  . Alcohol use 25.2 oz/week    42 Cans of beer per week     Comment: quit 04/2016     Allergies   Patient has no known allergies.   Review of Systems Review of Systems  Constitutional:       Per HPI, otherwise negative  HENT:       Per HPI, otherwise negative  Respiratory:       Per HPI, otherwise negative  Cardiovascular:       Per HPI, otherwise negative  Gastrointestinal: Negative for vomiting.  Endocrine:       Negative aside from HPI  Genitourinary:       Neg aside from HPI   Musculoskeletal:       Per HPI, otherwise negative  Skin: Negative.   Neurological: Negative for syncope.  Psychiatric/Behavioral: Positive for dysphoric mood, sleep disturbance and suicidal ideas.     Physical Exam Updated Vital Signs BP (!) 155/99 (BP Location: Right Arm)   Pulse (!) 110   Temp 97.8 F (36.6 C)   SpO2 90%   Physical Exam  Constitutional: He is oriented to person, place, and time. He appears well-developed. No distress.  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  Eyes: Conjunctivae and EOM are normal.  Cardiovascular: Normal rate and regular rhythm.   Pulmonary/Chest: Effort  normal. No stridor. No respiratory distress.  Abdominal: He exhibits no distension.  Musculoskeletal: He exhibits no edema.  Neurological: He is alert and oriented to person, place, and time.  Skin: Skin is warm and dry.  Psychiatric: He has a normal mood and affect. Thought content is paranoid. He expresses suicidal ideation.  Nursing note and vitals reviewed.    ED Treatments / Results  Labs (all labs ordered are listed, but only abnormal results are displayed) Labs Reviewed  COMPREHENSIVE METABOLIC PANEL - Abnormal; Notable for the following:       Result Value   CO2 21 (*)    Calcium 8.4 (*)    AST 56 (*)    All other components within normal limits  CBC - Abnormal; Notable for the following:    RBC 3.82 (*)    Hemoglobin 11.8 (*)    HCT 35.4 (*)    Platelets 418 (*)    All other components within normal limits  ETHANOL  SALICYLATE LEVEL  ACETAMINOPHEN LEVEL  RAPID URINE DRUG SCREEN, HOSP PERFORMED      Procedures Procedures (including critical care time)  Medications Ordered in ED Medications  acetaminophen (TYLENOL) tablet 650 mg (not administered)     Initial Impression / Assessment and Plan / ED Course  I have reviewed the triage vital signs and the nursing notes.  Pertinent labs & imaging results that were available during my care of the patient were reviewed by me and considered in my medical decision making (see chart for details).  Elderly male with history of alcohol abuse, depression presents with suicidal ideation. Patient is clinically intoxicated, but awake, alert, interactive, and describes his history well. Patient received Tylenol for back pain control, has no red flags for deep neurologic function, or spinal injury. With patient's endorsement of suicidal ideation he will be medically cleared for psychiatric evaluation after he has achieved sobriety. Patient's initial alcohol level is greater than 400.   Final Clinical Impressions(s) / ED  Diagnoses  Suicidal ideation Alcohol intoxication   Carmin Muskrat, MD 02/16/17 2323

## 2017-02-17 DIAGNOSIS — G8929 Other chronic pain: Secondary | ICD-10-CM

## 2017-02-17 DIAGNOSIS — F329 Major depressive disorder, single episode, unspecified: Secondary | ICD-10-CM

## 2017-02-17 DIAGNOSIS — F1010 Alcohol abuse, uncomplicated: Secondary | ICD-10-CM

## 2017-02-17 LAB — ETHANOL: Alcohol, Ethyl (B): 279 mg/dL — ABNORMAL HIGH (ref <5)

## 2017-02-17 MED ORDER — ACETAMINOPHEN 325 MG PO TABS
650.0000 mg | ORAL_TABLET | Freq: Four times a day (QID) | ORAL | Status: DC | PRN
  Administered 2017-02-17: 650 mg via ORAL
  Filled 2017-02-17: qty 2

## 2017-02-17 NOTE — Discharge Instructions (Signed)
For your ongoing behavioral health needs you are advised to follow up with [facility].  New patients are seen at their walk-in clinic.  Walk-in hours are Monday - Friday from 8:00 am - 12:00 pm, and from 1:00 pm - 3:00 pm.  Walk-in patients are seen on a first come, first served basis, so try to arrive as early as possible for the best chance of being seen the same day.  There is an initial fee of $22.50: ° °     [facility] °     [address] °     [place], [place] [address] °     [phone] °

## 2017-02-17 NOTE — ED Notes (Signed)
Pt up OOB walking in hallway.  Pt stated "walking helps with my back pain."

## 2017-02-17 NOTE — ED Notes (Signed)
Pt provided turkey sandwich & gingerale

## 2017-02-17 NOTE — ED Notes (Signed)
Pt requesting tylenol for back pain.

## 2017-02-17 NOTE — ED Notes (Signed)
TTS assessment in progress. 

## 2017-02-17 NOTE — BH Assessment (Addendum)
Tele Assessment Note   Anthony [name] is an 61 y.o. male who presents to the ED voluntarily. Pt is currently denying SI, HI, and reports past hx of VH that include the pt seeing "someone with a gun outside of the window and dead people that are alive but really dead." Pt was asked what led him to coming to the ED and he reports "well I've got a blood clot in my lungs, I've got skin cancer, I've got a broken back, and I'm getting thrown out of my apartment and I just couldn't take it anymore." Pt was asked if he had a plan to commit suicide as noted in the triage documentation but the pt denies. Pt stated "if I wanted to kill myself I would have done it already." Pt reports he has chronic pain and he feels that his PCP does not care about his medical issues because he has Medicaid. Pt endorses consuming up to a 12 pack of alcohol a day and his BAL on arrival to ED is 477(HH). Pt reports he has 3 DUI's in the past and has attended SA treatment many times. Pt states he has not driven in almost 18 years due to losing his license as a result of DUI's. Pt smiling throughout the assessment and stated "I had a choice between drinking or driving and I chose drinking." Pt was asked about his alcohol abuse and how often he consumes alcohol. Pt stated to this assessor "I'd like to have a beer right now" and began laughing during the assessment. Pt reports he does not have a plan to kill himself but states he does not want to be alive much longer. Pt reports a family hx of suicide and stated his mother committed suicide when he was 22 years old. Pt denies a current mental health provider.  Per [name], [place] pt will need an AM psych eval. [name], RN notified of the recommendation.   Diagnosis: Alcohol Use D/O; Substance Induced Mood D/O  Past Medical History:  Past Medical History:  Diagnosis Date  . Hydrocele    Surgically corrected  . Hypertension   . Lumbar compression fracture ([place])   . Skin cancer     exciced 2017    Past Surgical History:  Procedure Laterality Date  . HIATAL HERNIA REPAIR  2016  . HYDROCELE EXCISION / REPAIR  2010  . SKIN CANCER EXCISION Left    Excised 2017    Family History:  Family History  Problem Relation Age of Onset  . Heart attack Father        died at 39 years  . Dementia Father   . Stroke Father   . Cancer Sister     Social History:  reports that he has never smoked. He has never used smokeless tobacco. He reports that he drinks about 25.2 oz of alcohol per week . He reports that he uses drugs, including Marijuana.  Additional Social History:  Alcohol / Drug Use Pain Medications: See MAR Prescriptions: See MAR Over the Counter: See MAR History of alcohol / drug use?: Yes Substance #1 Name of Substance 1: Alcohol 1 - Age of First Use: 10 1 - Amount (size/oz): 12 beers  1 - Frequency: daily 1 - Duration: ongoing 1 - Last Use / Amount: 02/16/17 Substance #2 Name of Substance 2: Marijuana 2 - Age of First Use: teens 2 - Amount (size/oz): pt stated "2 hits" 2 - Frequency: 3x/ this year 2 - Duration: ongoing 2 -  Last Use / Amount: unknown  CIWA: CIWA-Ar BP: 138/89 Pulse Rate: (!) 106 COWS:    PATIENT STRENGTHS: (choose at least two) Ability for insight Active sense of humor Average or above average intelligence Capable of independent living Communication skills Financial means  Allergies: No Known Allergies  Home Medications:  (Not in a hospital admission)  OB/GYN Status:  No LMP for male patient.  General Assessment Data Location of Assessment: [facility] TTS Assessment: In system Is this a Tele or Face-to-Face Assessment?: Face-to-Face Is this an Initial Assessment or a Re-assessment for this encounter?: Initial Assessment Marital status: Divorced Is patient pregnant?: No Pregnancy Status: No Living Arrangements: Alone Can pt return to current living arrangement?: Yes Admission Status: Voluntary Is patient capable of  signing voluntary admission?: Yes Referral Source: Self/Family/Friend Insurance type: Medicaid     Crisis Care Plan Living Arrangements: Alone Name of Psychiatrist: none Name of Therapist: none  Education Status Is patient currently in school?: No Highest grade of school patient has completed: unknown  Risk to self with the past 6 months Suicidal Ideation: No-Not Currently/Within Last 6 Months Has patient been a risk to self within the past 6 months prior to admission? : No Suicidal Intent: No Has patient had any suicidal intent within the past 6 months prior to admission? : No Is patient at risk for suicide?: Yes (per triage note) Suicidal Plan?: No-Not Currently/Within Last 6 Months Has patient had any suicidal plan within the past 6 months prior to admission? : Yes Access to Means: Yes Specify Access to Suicidal Means: pt denies this during assessment, however chart reports pt reported to EMS that he planned to hang himself and has access to rope and items that could be used to hang himself  What has been your use of drugs/alcohol within the last 12 months?: reports to daily alcohol use  Previous Attempts/Gestures: No Triggers for Past Attempts: None known Intentional Self Injurious Behavior: None Family Suicide History: Yes (mother committed suicide when pt was 59 years old ) Recent stressful life event(s): Loss (Comment), Recent negative physical changes (put out of apartment ) Persecutory voices/beliefs?: No Depression: No Substance abuse history and/or treatment for substance abuse?: Yes Suicide prevention information given to non-admitted patients: Not applicable  Risk to Others within the past 6 months Homicidal Ideation: No Does patient have any lifetime risk of violence toward others beyond the six months prior to admission? : No Thoughts of Harm to Others: No Current Homicidal Intent: No Current Homicidal Plan: No Access to Homicidal Means: No History of harm to  others?: No Assessment of Violence: None Noted Does patient have access to weapons?: Yes (Comment) (machete) Criminal Charges Pending?: Yes Describe Pending Criminal Charges: pt reports he has trespassing charges pending in [place] Does patient have a court date: Yes Court Date:  (unknown) Is patient on probation?: No  Psychosis Hallucinations: Visual Delusions: None noted  Mental Status Report Appearance/Hygiene: Disheveled, In scrubs Eye Contact: Good Motor Activity: Unsteady Speech: Logical/coherent, Loud Level of Consciousness: Alert Mood: Silly, Euthymic Affect: Silly Anxiety Level: None Thought Processes: Tangential Judgement: Partial Orientation: Person, Place, Time, Situation, Appropriate for developmental age Obsessive Compulsive Thoughts/Behaviors: None  Cognitive Functioning Concentration: Fair Memory: Recent Intact, Remote Intact IQ: Average Insight: Poor Impulse Control: Fair Appetite: Fair Sleep: Decreased Total Hours of Sleep: 4 Vegetative Symptoms: None  ADLScreening [facility] Assessment Services) Patient's cognitive ability adequate to safely complete daily activities?: Yes Patient able to express need for assistance with ADLs?: Yes Independently performs  ADLs?: Yes (appropriate for developmental age)  Prior Inpatient Therapy Prior Inpatient Therapy: No  Prior Outpatient Therapy Prior Outpatient Therapy: No Does patient have an ACCT team?: No Does patient have Intensive In-House Services?  : No Does patient have [facility] services? : No Does patient have P4CC services?: No  ADL Screening (condition at time of admission) Patient's cognitive ability adequate to safely complete daily activities?: Yes Is the patient deaf or have difficulty hearing?: Yes Does the patient have difficulty seeing, even when wearing glasses/contacts?: No Does the patient have difficulty concentrating, remembering, or making decisions?: No Patient able to express need for  assistance with ADLs?: Yes Does the patient have difficulty dressing or bathing?: No Independently performs ADLs?: Yes (appropriate for developmental age) Does the patient have difficulty walking or climbing stairs?: Yes Weakness of Legs: Both Weakness of Arms/Hands: None  Home Assistive Devices/Equipment Home Assistive Devices/Equipment: Eyeglasses    Abuse/Neglect Assessment (Assessment to be complete while patient is alone) Physical Abuse: Denies Verbal Abuse: Denies Sexual Abuse: Denies Exploitation of patient/patient's resources: Denies Self-Neglect: Denies     Regulatory affairs officer (For Healthcare) Does Patient Have a Medical Advance Directive?: No Would patient like information on creating a medical advance directive?: No - Patient declined    Additional Information 1:1 In Past 12 Months?: No CIRT Risk: No Elopement Risk: No Does patient have medical clearance?:  (pending)     Disposition:  Disposition Initial Assessment Completed for this Encounter: Yes Disposition of Patient: Other dispositions Other disposition(s): Other (Comment) (AM psych eval per [name], [place])  Lyanne Co 02/17/2017 5:08 AM

## 2017-02-17 NOTE — ED Notes (Signed)
Pt requesting tylenol.  Informed is not time but will speak to provider.

## 2017-02-17 NOTE — BH Assessment (Signed)
[place] Assessment Progress Note  Per Corena Pilgrim, MD, this pt does not require psychiatric hospitalization at this time.  Pt is to be discharged from [facility] with recommendation to follow up with [facility].  This has been included in pt's discharge instructions.  Pt's nurse has been notified.  Jalene Mullet, [place] Triage Specialist [phone]

## 2017-02-17 NOTE — [facility] Suicide Risk Assessment (Signed)
Suicide Risk Assessment  Discharge Assessment   [facility] Discharge Suicide Risk Assessment   Principal Problem: Alcohol abuse Discharge Diagnoses:  Patient Active Problem List   Diagnosis Date Noted  . Alcohol abuse [F10.10] 09/16/2006    Priority: High  . Dyspnea [R06.00] 11/27/2016  . Actinic keratosis [L57.0] 11/27/2016  . Macrocytic anemia [D53.9] 11/23/2016  . Dry eye [B63.845] 11/23/2016  . Right pulmonary embolus ([place]) [I26.99] 11/02/2016  . Acute renal failure ([place]) [N17.9]   . Hiatal hernia [K44.9] 09/14/2016  . Skin cancer [C44.90] 08/13/2016  . Toe pain [M79.676] 07/07/2013  . Poor social situation [Z65.9] 06/09/2013  . Tinea versicolor [B36.0] 06/08/2013  . Lower back pain [M54.5] 05/07/2013  . Trigger finger, acquired [M65.30] 11/18/2012  . 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 Seborrheic dermatitis of scalp [L21.9] 11/08/2012  . HTN (hypertension) [I10] 04/11/2012  . Chronic low back pain [M54.5, G89.29] 09/16/2006    Total Time spent with patient: 30 minutes  Musculoskeletal: Strength & Muscle Tone: within normal limits Gait & Station: normal Patient leans: N/A  Psychiatric Specialty Exam:   Blood pressure (!) 135/94, pulse (!) 111, temperature 98.2 F (36.8 C), temperature source Oral, resp. rate (!) 22, SpO2 96 %.There is no height or weight on file to calculate BMI.  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 Social Support  Sleep:     Cognition: WNL  ADL's:  Intact   Mental Status Per Nursing Assessment::   On Admission:     Demographic Factors:  Male and Low socioeconomic status  Loss Factors: Decline in physical health and Financial problems/change in socioeconomic status  Historical Factors: Family history of suicide and Impulsivity  Risk Reduction Factors:   Positive social support  Continued Clinical Symptoms:  Depression:   Comorbid alcohol abuse/dependence Alcohol/Substance Abuse/Dependencies Chronic Pain  Cognitive Features That Contribute To Risk:  None    Suicide Risk:  Minimal: No identifiable suicidal ideation.  Patients presenting with no risk factors but with morbid ruminations; may be classified as minimal risk based on the severity of the depressive symptoms    Plan Of Care/Follow-up recommendations:  Activity:  as tolerated Diet:  heart healthy  Ethelene Hal, NP 02/17/2017, 10:18 AM

## 2017-02-17 NOTE — ED Notes (Addendum)
Pt stated "I fell 40 feet off my brother's roof and broke my back.  I haven't worked in 5 years because of it.  I saw Dr. Christella Noa last week.  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  I live in a room that was an old hotel.  I don't know who called the ambulance to bring me here.  I don't want to hurt myself and I don't want to kill myself but I don't want to suffer.  My dad died a horrible death.  He waited too long."

## 2017-02-27 ENCOUNTER — Other Ambulatory Visit: Payer: Self-pay | Admitting: Student

## 2017-02-27 DIAGNOSIS — I2699 Other pulmonary embolism without acute cor pulmonale: Secondary | ICD-10-CM

## 2017-03-18 ENCOUNTER — Ambulatory Visit (INDEPENDENT_AMBULATORY_CARE_PROVIDER_SITE_OTHER): Payer: Medicaid Other | Admitting: Student

## 2017-03-18 ENCOUNTER — Encounter: Payer: Self-pay | Admitting: Student

## 2017-03-18 VITALS — BP 118/74 | HR 116 | Temp 97.9°F | Ht 70.0 in | Wt 189.0 lb

## 2017-03-18 DIAGNOSIS — R0600 Dyspnea, unspecified: Secondary | ICD-10-CM | POA: Diagnosis not present

## 2017-03-18 DIAGNOSIS — Z23 Encounter for immunization: Secondary | ICD-10-CM | POA: Diagnosis not present

## 2017-03-18 DIAGNOSIS — I2699 Other pulmonary embolism without acute cor pulmonale: Secondary | ICD-10-CM | POA: Diagnosis not present

## 2017-03-18 MED ORDER — ZOSTER VAC RECOMB ADJUVANTED 50 MCG/0.5ML IM SUSR: 0.5000 mL | Freq: Once | INTRAMUSCULAR | 1 refills | Status: AC

## 2017-03-18 MED ORDER — PNEUMOCOCCAL VAC POLYVALENT 25 MCG/0.5ML IJ INJ: 0.5000 mL | INJECTION | Freq: Once | INTRAMUSCULAR | 0 refills | Status: AC

## 2017-03-18 NOTE — Progress Notes (Signed)
Subjective:    Anthony [name] is a 61 y.o. old male here to discuss about PE/shortness of breath  HPI Shortness of breath: this has been going on for two months.  Short of breath with walking over 300 yards today. It is getting better. Never a smoker or history of COPD. No history of CHF. Reports good compliance with Xarelto. Denies swelling in legs. Sleeps on recliner for his back pain. He had a PE about 4 months ago. He asks for more explanation about his PE and course of his Xarelto.  Denies cough, fever, night sweat losing and weight. Patient with alcohol use disorder.   PMH/Problem List: has Alcohol abuse; Chronic low back pain; HTN (hypertension); Ulnar neuropathy at wrist; Seborrheic dermatitis of scalp; Trigger finger, acquired; Lower back pain; Tinea versicolor; Poor social situation; Toe pain; Skin cancer; Hiatal hernia; Right pulmonary embolus ([place]); Acute renal failure ([place]); Macrocytic anemia; Dry eye; Dyspnea; and Actinic keratosis on his problem list.   has a past medical history of Hydrocele; Hypertension; Lumbar compression fracture ([place]); and Skin cancer.  FH:  Family History  Problem Relation Age of Onset  . Heart attack Father        died at 48 years  . Dementia Father   . Stroke Father   . Cancer Sister     [facility] Social History  Substance Use Topics  . Smoking status: Never Smoker  . Smokeless tobacco: Never Used  . Alcohol use 25.2 oz/week    42 Cans of beer per week     Comment: quit 04/2016    Review of Systems Review of systems negative except for pertinent positives and negatives in history of present illness above.     Objective:    Vitals:   03/18/17 1408  BP: 118/74  Pulse: (!) 116  Temp: 97.9 F (36.6 C)  TempSrc: Oral  SpO2: 96%  Weight: 189 lb (85.7 kg)  Height: 5\' 10"  (1.778 m)   Body mass index is 27.12 kg/m.  Physical Exam GEN: appears well, no apparent distress. HEM: negative for cervical or periauricular lymphadenopathies CVS: 96/min,  RR, nl S1&S2, no murmurs, no edema RESP: Speaks in full sentences, no IWOB, good air movement bilaterally, CTAB GI: BS present & normal, soft, NTND MSK: no focal tenderness or notable swelling SKIN: no apparent skin lesion NEURO: alert and oiented appropriately, no gross deficits     Assessment and Plan:  1. Dyspnea, unspecified type: No clear explanation for this. He reports feeling short after walking about 300 yards. Lung exam within normal limits. Has history of PE but he is on Xarelto and reports good compliance with this. History and exam not consistent with COPD or CHF. Has microcytic anemia due to alcohol abuse which could contribute to this to some extent. No signs and symptoms of infectious etiology. He has mild tachycardia likely due to anemia and alcohol. Advised him to quit drinking.   2. Right pulmonary embolus ([place]): Stable. -Continue Xarelto 20 mg daily. Started about 4 months ago.   3. Encounter for immunization - Zoster Vac Recomb Adjuvanted [facility]) injection; Inject 0.5 mLs into the muscle once. Repeat dose in 2 to 6 months.  Dispense: 0.5 mL; Refill: 1 - pneumococcal 23 valent vaccine (PNU-IMMUNE) 25 MCG/0.5ML injection; Inject 0.5 mLs into the muscle once.  Dispense: 0.5 mL; Refill: 0 - Flu Vaccine QUAD 36+ mos IM given  4. Screening for colon cancer: Gave him phone number to schedule for his colonoscopy.  Return if symptoms worsen  or fail to improve.  Mercy Riding, MD 03/18/17 Pager: [phone]

## 2017-03-18 NOTE — Patient Instructions (Addendum)
It was great seeing you today! We have addressed the following issues today 1. Shortness of breath: it is unlikely that this is related to your pulmonary embolism while you're taking your medication. I strongly recommend more physical activity. I also recommend quitting drinking.  2.   Screening for colon cancer: Please call one of the numbers below to schedule for colonoscopy 3.   Shingles vaccine: Please take the prescription we gave you to your pharmacy is to get a shingles vaccine.  If we did any lab work today, and the results require attention, either me or my nurse will get in touch with you. If everything is normal, you will get a letter in mail and a message via . If you don't hear from [place] in two weeks, please give [place] a call. Otherwise, we look forward to seeing you again at your next visit. If you have any questions or concerns before then, please call the clinic at [phone].  Please bring all your medications to every doctors visit  Sign up for My Chart to have easy access to your labs results, and communication with your Primary care physician.    Please check-out at the front desk before leaving the clinic.    Take Care,   Dr. Cyndia Skeeters              Colon Cancer  People with early colon cancer usually have no warning signs or symptoms.  If found early, most patients can be cured, but if found when it has already spread, the chance of survival is not as good.  Colon cancer is the second most common cause of concern is in the [place] with over 56,000 deaths from colon cancer in 2005  Colon cancer is a common, treatable disease. Screening tests can find a cancer  early, before you have symptoms, and make it more likely that you will survive the disease.  Who needs to be tested? If you are age 61-75 yrs, you should be tested for colon cancer.  Ways to be tested:  A colonoscopy the best test to detect colon cancer. It requires you to drink a bowel preparation to clean out  your colon before the test. During this test, a tube with a camera inserted into your rectum and examines your entire colon. You can be given medicine to make you sleepy during the exam. Therefore, you will not be able to drive immediately after the test. There is a small risk of bowel injury during the test.   Stool cards that you can take home and take a sample of your stool is another option. The cards are not as good as colonoscopy at detecting cancer, but the tests are easier and cheaper.   To schedule the colonoscopy, you can call one of the 3 options below:  [facility]. Phone number: [phone]  [facility]. Phone number: [phone]  [facility]: Phone number [phone]          Shigirix (Shingles Vaccine) Recombinant zoster (shingles) vaccine: Patient drug information Access [employer] here. Copyright (626) [phone] [employer] All rights reserved. (For additional information see "Recombinant zoster (shingles) vaccine: Drug information") Brand Names: [place]  Shingrix  What is this drug used for?   It is used to prevent shingles.  What do I need to tell my doctor BEFORE I take this drug?   If you have an allergy to this drug or any part of this drug.   If you are allergic to any drugs like  this one, any other drugs, foods, or other substances. Tell your doctor about the allergy and what signs you had, like rash; hives; itching; shortness of breath; wheezing; cough; swelling of face, lips, tongue, or throat; or any other signs.   This drug may interact with other drugs or health problems.   Tell your doctor and pharmacist about all of your drugs (prescription or OTC, natural products, vitamins) and health problems. You must check to make sure that it is safe for you to take this drug with all of your drugs and health problems. Do not start, stop, or change the dose of any drug without checking with your doctor.  What are some things I need to know or do while I take  this drug?   Tell all of your health care providers that you take this drug. This includes your doctors, nurses, pharmacists, and dentists.   Tell your doctor if you are pregnant or plan on getting pregnant. You will need to talk about the benefits and risks of using this drug while you are pregnant.   Tell your doctor if you are breast-feeding. You will need to talk about any risks to your baby.  What are some side effects that I need to call my doctor about right away?   WARNING/CAUTION: Even though it may be rare, some people may have very bad and sometimes deadly side effects when taking a drug. Tell your doctor or get medical help right away if you have any of the following signs or symptoms that may be related to a very bad side effect:   Signs of an allergic reaction, like rash; hives; itching; red, swollen, blistered, or peeling skin with or without fever; wheezing; tightness in the chest or throat; trouble breathing, swallowing, or talking; unusual hoarseness; or swelling of the mouth, face, lips, tongue, or throat.  What are some other side effects of this drug?   All drugs may cause side effects. However, many people have no side effects or only have minor side effects. Call your doctor or get medical help if any of these side effects or any other side effects bother you or do not go away:   Pain where the shot was given.   Redness or swelling where the shot is given.   Muscle pain.   Feeling tired or weak.   Headache.   Shivering.   Fever.   Upset stomach or throwing up.   Loose stools (diarrhea).   Belly pain.   These are not all of the side effects that may occur. If you have questions about side effects, call your doctor. Call your doctor for medical advice about side effects.   You may report side effects to your national health agency.  How is this drug best taken?   Use this drug as ordered by your doctor. Read all information given to you. Follow all instructions  closely.   It is given as a shot into a muscle.  What do I do if I miss a dose?   Call your doctor to find out what to do.  How do I store and/or throw out this drug?   If you need to store this drug at home, talk with your doctor, nurse, or pharmacist about how to store it.  General drug facts   If your symptoms or health problems do not get better or if they become worse, call your doctor.   Do not share your drugs with others and do not  take anyone else's drugs.   Keep a list of all your drugs (prescription, natural products, vitamins, OTC) with you. Give this list to your doctor.   Talk with the doctor before starting any new drug, including prescription or OTC, natural products, or vitamins.   Keep all drugs in a safe place. Keep all drugs out of the reach of children and pets.   Throw away unused or expired drugs. Do not flush down a toilet or pour down a drain unless you are told to do so. Check with your pharmacist if you have questions about the best way to throw out drugs. There may be drug take-back programs in your area.   Some drugs may have another patient information leaflet. If you have any questions about this drug, please talk with your doctor, nurse, pharmacist, or other health care provider.   If you think there has been an overdose, call your poison control center or get medical care right away. Be ready to tell or show what was taken, how much, and when it happened.  CDC Information   Vaccine Information Statements (VIS) are made by the staff of the Centers for Disease Control and Prevention (CDC). Each VIS gives information to properly inform the adult receiving the vaccine or, in the case of a minor, the child's parent or legal representative about the risks and benefits of each vaccine. Before a doctor vaccinates a child or an adult, the provider is required by the National Childhood Vaccine Injury Act to give a copy of the VIS. You can also get foreign language  versions.   https://www.bishop.org/

## 2017-03-25 ENCOUNTER — Telehealth: Payer: Self-pay | Admitting: *Deleted

## 2017-03-25 NOTE — Telephone Encounter (Signed)
Called and talked to his pharmacist. Realized that he has gotten Pneumovax about three months ago. So, doesn't need more.

## 2017-03-25 NOTE — Telephone Encounter (Signed)
Received fax from [place] requesting new Rx for Prevnar since patient is under age 61.  Will route request to PCP.  Burna Forts, BSN, RN-BC

## 2017-04-02 ENCOUNTER — Other Ambulatory Visit: Payer: Self-pay | Admitting: Student

## 2017-04-12 ENCOUNTER — Other Ambulatory Visit: Payer: Self-pay | Admitting: Student

## 2017-04-14 ENCOUNTER — Other Ambulatory Visit: Payer: Self-pay | Admitting: Student

## 2017-04-22 ENCOUNTER — Emergency Department (HOSPITAL_COMMUNITY): Payer: Medicaid Other

## 2017-04-22 ENCOUNTER — Emergency Department (HOSPITAL_COMMUNITY)
Admission: EM | Admit: 2017-04-22 | Discharge: 2017-04-22 | Disposition: A | Discharge status: Home / Self Care | Payer: Medicaid Other | Attending: Emergency Medicine | Admitting: Emergency Medicine

## 2017-04-22 ENCOUNTER — Encounter (HOSPITAL_COMMUNITY): Payer: Self-pay | Admitting: Radiology

## 2017-04-22 DIAGNOSIS — F1010 Alcohol abuse, uncomplicated: Secondary | ICD-10-CM | POA: Diagnosis not present

## 2017-04-22 DIAGNOSIS — F10920 Alcohol use, unspecified with intoxication, uncomplicated: Secondary | ICD-10-CM | POA: Insufficient documentation

## 2017-04-22 DIAGNOSIS — R079 Chest pain, unspecified: Secondary | ICD-10-CM | POA: Insufficient documentation

## 2017-04-22 DIAGNOSIS — Z7901 Long term (current) use of anticoagulants: Secondary | ICD-10-CM | POA: Diagnosis not present

## 2017-04-22 DIAGNOSIS — Z79899 Other long term (current) drug therapy: Secondary | ICD-10-CM | POA: Insufficient documentation

## 2017-04-22 DIAGNOSIS — F32A Depression, unspecified: Secondary | ICD-10-CM

## 2017-04-22 DIAGNOSIS — I10 Essential (primary) hypertension: Secondary | ICD-10-CM | POA: Insufficient documentation

## 2017-04-22 DIAGNOSIS — F329 Major depressive disorder, single episode, unspecified: Secondary | ICD-10-CM | POA: Diagnosis not present

## 2017-04-22 LAB — CBC WITH DIFFERENTIAL/PLATELET
BASOS PCT: 1 %
Basophils Absolute: 0 10*3/uL (ref 0.0–0.1)
EOS ABS: 0.1 10*3/uL (ref 0.0–0.7)
EOS PCT: 1 %
HCT: 37.5 % — ABNORMAL LOW (ref 39.0–52.0)
Hemoglobin: 12.5 g/dL — ABNORMAL LOW (ref 13.0–17.0)
LYMPHS ABS: 2.8 10*3/uL (ref 0.7–4.0)
Lymphocytes Relative: 51 %
MCH: 30.5 pg (ref 26.0–34.0)
MCHC: 33.3 g/dL (ref 30.0–36.0)
MCV: 91.5 fL (ref 78.0–100.0)
MONOS PCT: 8 %
Monocytes Absolute: 0.5 10*3/uL (ref 0.1–1.0)
Neutro Abs: 2.1 10*3/uL (ref 1.7–7.7)
Neutrophils Relative %: 39 %
PLATELETS: 217 10*3/uL (ref 150–400)
RBC: 4.1 MIL/uL — ABNORMAL LOW (ref 4.22–5.81)
RDW: 17.3 % — ABNORMAL HIGH (ref 11.5–15.5)
WBC: 5.5 10*3/uL (ref 4.0–10.5)

## 2017-04-22 LAB — COMPREHENSIVE METABOLIC PANEL
ALBUMIN: 3.8 g/dL (ref 3.5–5.0)
ALT: 37 U/L (ref 17–63)
AST: 53 U/L — AB (ref 15–41)
Alkaline Phosphatase: 83 U/L (ref 38–126)
Anion gap: 12 (ref 5–15)
BILIRUBIN TOTAL: 0.4 mg/dL (ref 0.3–1.2)
BUN: 10 mg/dL (ref 6–20)
CO2: 24 mmol/L (ref 22–32)
Calcium: 8.3 mg/dL — ABNORMAL LOW (ref 8.9–10.3)
Chloride: 108 mmol/L (ref 101–111)
Creatinine, Ser: 0.69 mg/dL (ref 0.61–1.24)
GFR calc Af Amer: >60 mL/min (ref >60)
GFR calc non Af Amer: >60 mL/min (ref >60)
GLUCOSE: 121 mg/dL — AB (ref 65–99)
Potassium: 3.2 mmol/L — ABNORMAL LOW (ref 3.5–5.1)
SODIUM: 144 mmol/L (ref 135–145)
TOTAL PROTEIN: 7.5 g/dL (ref 6.5–8.1)

## 2017-04-22 LAB — I-STAT TROPONIN, ED: Troponin i, poc: 0.01 ng/mL (ref 0.00–0.08)

## 2017-04-22 LAB — RAPID URINE DRUG SCREEN, HOSP PERFORMED
AMPHETAMINES: NOT DETECTED
BENZODIAZEPINES: NOT DETECTED
Barbiturates: NOT DETECTED
Cocaine: NOT DETECTED
Opiates: NOT DETECTED
TETRAHYDROCANNABINOL: NOT DETECTED

## 2017-04-22 LAB — D-DIMER, QUANTITATIVE (NOT AT ARMC): D DIMER QUANT: 0.72 ug{FEU}/mL — AB (ref 0.00–0.50)

## 2017-04-22 LAB — ACETAMINOPHEN LEVEL

## 2017-04-22 LAB — ETHANOL: ALCOHOL ETHYL (B): 426 mg/dL — AB (ref <10)

## 2017-04-22 MED ORDER — ACETAMINOPHEN 500 MG PO TABS
1000.0000 mg | ORAL_TABLET | Freq: Once | ORAL | Status: AC
  Administered 2017-04-22: 1000 mg via ORAL
  Filled 2017-04-22: qty 2

## 2017-04-22 MED ORDER — CHLORDIAZEPOXIDE HCL 25 MG PO CAPS: ORAL_CAPSULE | ORAL | 0 refills | Status: DC

## 2017-04-22 MED ORDER — SODIUM CHLORIDE 0.9 % IV BOLUS (SEPSIS)
500.0000 mL | Freq: Once | INTRAVENOUS | Status: AC
  Administered 2017-04-22: 500 mL via INTRAVENOUS

## 2017-04-22 MED ORDER — LORAZEPAM 2 MG/ML IJ SOLN
1.0000 mg | Freq: Once | INTRAMUSCULAR | Status: AC
  Administered 2017-04-22: 1 mg via INTRAVENOUS
  Filled 2017-04-22: qty 1

## 2017-04-22 MED ORDER — IOPAMIDOL (ISOVUE-370) INJECTION 76%
INTRAVENOUS | Status: AC
  Administered 2017-04-22: 100 mL via INTRAVENOUS
  Filled 2017-04-22: qty 100

## 2017-04-22 MED ORDER — IOPAMIDOL (ISOVUE-370) INJECTION 76%
100.0000 mL | Freq: Once | INTRAVENOUS | Status: AC | PRN
  Administered 2017-04-22: 100 mL via INTRAVENOUS

## 2017-04-22 MED ORDER — CHLORDIAZEPOXIDE HCL 25 MG PO CAPS
100.0000 mg | ORAL_CAPSULE | Freq: Once | ORAL | Status: AC
  Administered 2017-04-22: 100 mg via ORAL
  Filled 2017-04-22: qty 4

## 2017-04-22 NOTE — ED Notes (Signed)
Patient agitated, yelling at staff multiple times to "get my medication now" patient requesting a muscle relaxant for his chronic back pain. Dr. Tamera Punt aware. Orders placed for ativan.

## 2017-04-22 NOTE — ED Notes (Signed)
Patient transported to X-ray 

## 2017-04-22 NOTE — ED Triage Notes (Signed)
Per EMS, patient comes from home. He lives with his friend who he asked to call PD for having suicidal thoughts. Pt has hx of chronic back and chest pain. Pt smells of ETOH and verbalized to EMS he is intoxicated. During triage patient stated he did not want to verbalize to me if he was suicidal or not.

## 2017-04-22 NOTE — ED Provider Notes (Signed)
61 yo M with a history of alcoholism here with the acute intoxication. The patient is now clinically sober. He denies suicidal or homicidal ideology. Given resources for alcohol. Also given a Librium taper. Discharge home.   Deno Etienne, DO 04/22/17 1733

## 2017-04-22 NOTE — ED Notes (Signed)
Patient walked in hall with RN and MD. Patient steady on feet. Okay to d/c monitors except pulse ox per Dr. Tamera Punt.

## 2017-04-22 NOTE — ED Notes (Signed)
PT pulled IV out of arm. RN have been made aware

## 2017-04-22 NOTE — ED Notes (Signed)
Bed: ST41 Expected date:  Expected time:  Means of arrival:  Comments: EMS SI/intoxicated/chest pain

## 2017-04-22 NOTE — ED Notes (Signed)
PT have been made aware of urine sample. Urinal in hand

## 2017-04-22 NOTE — ED Provider Notes (Signed)
[facility] DEPT Provider Note   CSN: [id number] Arrival date & time: 04/22/17  0721     History   Chief Complaint Chief Complaint  Patient presents with  . Suicidal  . Alcohol Intoxication  . Back Pain    chronic  . Chest Pain    HPI Anthony [name] is a 61 y.o. male.  Patient is a 61 year old male with a history of chronic back pain, prior pulmonary embolus and alcohol abuse who presents with shortness of breath and thoughts of suicide. He does say that he drinks a lot. When I asked him if he is suicidal, he states that he doesn't want answer that question. Reportedly he asked a friend of his to call the police department because he was having suicidal thoughts. He does report shortness of breath. He states that he gets short of breath walking even short distances. He doesn't have a history of prior lung disease other than prior right pulmonary embolus that happened in March of this year. He is on Xarelto for this. He reports that he is taking it as directed.  He denies any cough or fever. He does say that he's had some occasional vomiting which she attributes to drinking too much. He does not use inhalers for his shortness of breath. He is not on oxygen at home.  He denies any associated chest pain.      Past Medical History:  Diagnosis Date  . Hydrocele    Surgically corrected  . Hypertension   . Lumbar compression fracture ([place])   . Skin cancer    exciced 2017    Patient Active Problem List   Diagnosis Date Noted  . Dyspnea 11/27/2016  . Actinic keratosis 11/27/2016  . Macrocytic anemia 11/23/2016  . Dry eye 11/23/2016  . Right pulmonary embolus ([place]) 11/02/2016  . Acute renal failure ([place])   . Hiatal hernia 09/14/2016  . Skin cancer 08/13/2016  . Toe pain 07/07/2013  . Poor social situation 06/09/2013  . Tinea versicolor 06/08/2013  . Lower back pain 05/07/2013  . Trigger finger, acquired 11/18/2012  . 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 HTN (hypertension) 04/11/2012  . Alcohol abuse 09/16/2006  . Chronic low back pain 09/16/2006    Past Surgical History:  Procedure Laterality Date  . HIATAL HERNIA REPAIR  2016  . HYDROCELE EXCISION / REPAIR  2010  . SKIN CANCER EXCISION Left    Excised 2017       Home Medications    Prior to Admission medications   Medication Sig Start Date End Date Taking? Authorizing Provider  acetaminophen (TYLENOL 8 HOUR) 650 MG CR tablet Take 1 tablet (650 mg total) by mouth every 8 (eight) hours as needed for pain. 12/18/16  Yes Mercy Riding, MD  [facility] VITAMIN B12 1000 MCG tablet TAKE 1 TABLET BY MOUTH EVERY DAY 02/01/17  Yes Gonfa, Taye T, MD  ferrous sulfate 325 (65 FE) MG tablet Take 325 mg by mouth 2 (two) times a week.   Yes [provider]  folic acid (FOLVITE) 1 MG tablet Take 1 tablet (1 mg total) by mouth 2 (two) times a week. 11/23/16  Yes Mercy Riding, MD  hydrochlorothiazide (HYDRODIURIL) 25 MG tablet Take 1 tablet (25 mg total) by mouth daily. 11/27/16  Yes Mercy Riding, MD  hydroxypropyl methylcellulose / hypromellose (ISOPTO TEARS / GONIOVISC) 2.5 % ophthalmic solution Place 1 drop into both eyes as needed for dry  eyes.   Yes [provider]  methocarbamol (ROBAXIN) 750 MG tablet TAKE 1 TABLET (750 MG TOTAL) BY MOUTH EVERY 8 (EIGHT) HOURS AS NEEDED. 04/05/17  Yes Mercy Riding, MD  Multiple Vitamin (MULTIVITAMIN WITH MINERALS) TABS tablet Take 1 tablet by mouth 2 (two) times a week.   Yes [provider]  thiamine (VITAMIN B-1) 100 MG tablet Take 1 tablet (100 mg total) by mouth 2 (two) times a week. 11/23/16  Yes Gonfa, Taye T, MD  XARELTO 20 MG TABS tablet TAKE 1 TABLET (20 MG TOTAL) BY MOUTH DAILY WITH SUPPER. 03/01/17  Yes Gonfa, Charlesetta Ivory, MD  Multiple Vitamins-Minerals (PRESERVISION AREDS 2) CAPS Take 2 capsules by mouth 2 (two) times a week. Patient not taking: Reported on 02/17/2017 11/23/16   Mercy Riding, MD  OLANZapine  (ZYPREXA) 10 MG tablet TAKE 1 TABLET BY MOUTH EVERYDAY AT BEDTIME Patient not taking: Reported on 04/22/2017 04/12/17   Mercy Riding, MD    Family History Family History  Problem Relation Age of Onset  . Heart attack Father        died at 87 years  . Dementia Father   . Stroke Father   . Cancer Sister     Social History Social History  Substance Use Topics  . Smoking status: Never Smoker  . Smokeless tobacco: Never Used  . Alcohol use 25.2 oz/week    42 Cans of beer per week     Comment: quit 04/2016     Allergies   Patient has no known allergies.   Review of Systems Review of Systems  Constitutional: Negative for chills, diaphoresis, fatigue and fever.  HENT: Negative for congestion, rhinorrhea and sneezing.   Eyes: Negative.   Respiratory: Positive for shortness of breath. Negative for cough and chest tightness.   Cardiovascular: Negative for chest pain and leg swelling.  Gastrointestinal: Positive for nausea and vomiting. Negative for abdominal pain, blood in stool and diarrhea.  Genitourinary: Negative for difficulty urinating, flank pain, frequency and hematuria.  Musculoskeletal: Positive for back pain (chronic). Negative for arthralgias.  Skin: Negative for rash.  Neurological: Negative for dizziness, speech difficulty, weakness, numbness and headaches.     Physical Exam Updated Vital Signs BP 121/89   Pulse 90   Temp 97.9 F (36.6 C)   Resp 18   Ht 5\' 8"  (1.727 m)   Wt 81.6 kg (180 lb)   SpO2 96%   BMI 27.37 kg/m   Physical Exam  Constitutional: He is oriented to person, place, and time. He appears well-developed and well-nourished.  Disheveled  HENT:  Head: Normocephalic and atraumatic.  Eyes: Pupils are equal, round, and reactive to light.  Neck: Normal range of motion. Neck supple.  Cardiovascular: Regular rhythm and normal heart sounds.  Tachycardia present.   Pulmonary/Chest: Effort normal and breath sounds normal. No respiratory distress.  He has no wheezes. He has no rales. He exhibits no tenderness.  Abdominal: Soft. Bowel sounds are normal. There is no tenderness. There is no rebound and no guarding.  Musculoskeletal: Normal range of motion. He exhibits edema (mild nonpitting edema bilaterally).  Lymphadenopathy:    He has no cervical adenopathy.  Neurological: He is alert and oriented to person, place, and time.  Skin: Skin is warm and dry. No rash noted.  Psychiatric: He has a normal mood and affect.     ED Treatments / Results  Labs (all labs ordered are listed, but only abnormal results are displayed) Labs Reviewed  COMPREHENSIVE METABOLIC PANEL - Abnormal; Notable for the following:       Result Value   Potassium 3.2 (*)    Glucose, Bld 121 (*)    Calcium 8.3 (*)    AST 53 (*)    All other components within normal limits  ETHANOL - Abnormal; Notable for the following:    Alcohol, Ethyl (B) 426 (*)    All other components within normal limits  CBC WITH DIFFERENTIAL/PLATELET - Abnormal; Notable for the following:    RBC 4.10 (*)    Hemoglobin 12.5 (*)    HCT 37.5 (*)    RDW 17.3 (*)    All other components within normal limits  ACETAMINOPHEN LEVEL - Abnormal; Notable for the following:    Acetaminophen (Tylenol), Serum <10 (*)    All other components within normal limits  D-DIMER, QUANTITATIVE (NOT AT [facility]) - Abnormal; Notable for the following:    D-Dimer, Quant 0.72 (*)    All other components within normal limits  RAPID URINE DRUG SCREEN, HOSP PERFORMED  I-STAT TROPONIN, ED    EKG  EKG Interpretation  Date/Time:  Thursday April 22 2017 07:37:28 EDT Ventricular Rate:  120 PR Interval:    QRS Duration: 91 QT Interval:  321 QTC Calculation: 454 R Axis:   59 Text Interpretation:  Sinus tachycardia since last tracing no significant change Confirmed by Malvin Johns [phone]) on 04/22/2017 8:40:03 AM       Radiology Dg Chest 2 View  Result Date: 04/22/2017 CLINICAL DATA:  Suicidal ideation,  alcohol on born. Patient ports mid chest and mid back pain and worsening of shortness of breath recently. History of pulmonary embolism. Nonsmoker. EXAM: CHEST  2 VIEW COMPARISON:  Chest x-ray of November 01, 2016 FINDINGS: The lungs are hypoinflated. There is some crowding of the interstitial lung markings. The cardiac silhouette is mildly enlarged. The pulmonary vascularity is not engorged. There is faint calcification in the wall of the aortic arch. There is no pleural effusion or pneumothorax. The observed bony thorax is unremarkable. IMPRESSION: Bilateral hypoinflation. This accentuates the lung markings as well as the cardiac silhouette. No definite acute cardiopulmonary abnormality. Electronically Signed   By: David  Martinique M.D.   On: 04/22/2017 08:18   Ct Angio Chest Pe W/cm &/or Wo Cm  Result Date: 04/22/2017 CLINICAL DATA:  Chest and back pain. Evaluate for pulmonary embolism. EXAM: CT ANGIOGRAPHY CHEST WITH CONTRAST TECHNIQUE: Multidetector CT imaging of the chest was performed using the standard protocol during bolus administration of intravenous contrast. Multiplanar CT image reconstructions and MIPs were obtained to evaluate the vascular anatomy. CONTRAST:  100 cc Isovue 370 intravenous contrast. COMPARISON:  CTA chest dated November 02, 2016. FINDINGS: Cardiovascular: Satisfactory opacification of the pulmonary arteries to the segmental level. No evidence of pulmonary embolism. Mild cardiomegaly. No pericardial effusion. Normal thoracic aorta. Mediastinum/Nodes: No enlarged mediastinal, hilar, or axillary lymph nodes. The thyroid gland and trachea are unremarkable. Mildly patulous, partially fluid filled esophagus. Lungs/Pleura: Bibasilar atelectasis. Unchanged calcified granuloma in the posterior right upper lobe. Stable 4 mm nodule at the left lung base (series 11, image 78). No pneumothorax or pleural effusion. Upper Abdomen: No acute abnormality. Prior fundoplication at the gastroesophageal  junction. Musculoskeletal: Degenerative changes of the thoracic spine. Old right-sided rib fractures. No acute osseous abnormality. Review of the MIP images confirms the above findings. IMPRESSION: 1. No evidence of pulmonary embolism. No acute intrathoracic process. Electronically Signed   By: Titus Dubin M.D.   On: 04/22/2017 10:56  Procedures Procedures (including critical care time)  Medications Ordered in ED Medications  sodium chloride 0.9 % bolus 500 mL (0 mLs Intravenous Stopped 04/22/17 0910)  acetaminophen (TYLENOL) tablet 1,000 mg (1,000 mg Oral Given 04/22/17 0918)  LORazepam (ATIVAN) injection 1 mg (1 mg Intravenous Given 04/22/17 0945)  iopamidol (ISOVUE-370) 76 % injection 100 mL (100 mLs Intravenous Contrast Given 04/22/17 1023)     Initial Impression / Assessment and Plan / ED Course  I have reviewed the triage vital signs and the nursing notes.  Pertinent labs & imaging results that were available during my care of the patient were reviewed by me and considered in my medical decision making (see chart for details).      Pt presents with ETOH intoxication and SI.  Also having chest pain.  No evidence of acute coronary syndrome. No ischemic changes on EKG. His d-dimer was elevated and a CT scan was performed which showed no evidence of PE. His alcohol level was markedly elevated. He will need to sober up and will need a reassessment 1 over to assess for ongoing suicidal ideations.  Turned over to oncoming EDP.  Final Clinical Impressions(s) / ED Diagnoses   Final diagnoses:  Alcoholic intoxication without complication ([place])  Depression, unspecified depression type    New Prescriptions New Prescriptions   No medications on file     Malvin Johns, MD 04/22/17 1526

## 2017-04-22 NOTE — ED Notes (Signed)
Bed: WA31 Expected date:  Expected time:  Means of arrival:  Comments: 

## 2017-04-22 NOTE — ED Notes (Signed)
Date and time results received: 04/22/17 8:59 AM  Test: Alcohol Critical Value: 426  Name of Provider Notified: Tamera Punt, MD  Orders Received? Or Actions Taken?: awaiting orders

## 2017-04-26 ENCOUNTER — Emergency Department (HOSPITAL_COMMUNITY)
Admission: EM | Admit: 2017-04-26 | Discharge: 2017-04-26 | Disposition: A | Discharge status: Home / Self Care | Payer: Medicaid Other | Attending: Emergency Medicine | Admitting: Emergency Medicine

## 2017-04-26 ENCOUNTER — Encounter (HOSPITAL_COMMUNITY): Payer: Self-pay | Admitting: Emergency Medicine

## 2017-04-26 ENCOUNTER — Telehealth: Payer: Self-pay | Admitting: *Deleted

## 2017-04-26 DIAGNOSIS — Z79899 Other long term (current) drug therapy: Secondary | ICD-10-CM | POA: Diagnosis not present

## 2017-04-26 DIAGNOSIS — G8929 Other chronic pain: Secondary | ICD-10-CM

## 2017-04-26 DIAGNOSIS — M549 Dorsalgia, unspecified: Secondary | ICD-10-CM | POA: Insufficient documentation

## 2017-04-26 DIAGNOSIS — R159 Full incontinence of feces: Secondary | ICD-10-CM | POA: Diagnosis not present

## 2017-04-26 DIAGNOSIS — F1019 Alcohol abuse with unspecified alcohol-induced disorder: Secondary | ICD-10-CM | POA: Insufficient documentation

## 2017-04-26 DIAGNOSIS — I10 Essential (primary) hypertension: Secondary | ICD-10-CM | POA: Insufficient documentation

## 2017-04-26 DIAGNOSIS — F1010 Alcohol abuse, uncomplicated: Secondary | ICD-10-CM

## 2017-04-26 LAB — ETHANOL: Alcohol, Ethyl (B): 347 mg/dL (ref <10)

## 2017-04-26 LAB — COMPREHENSIVE METABOLIC PANEL
ALBUMIN: 3.8 g/dL (ref 3.5–5.0)
ALK PHOS: 83 U/L (ref 38–126)
ALT: 46 U/L (ref 17–63)
ANION GAP: 13 (ref 5–15)
AST: 138 U/L — ABNORMAL HIGH (ref 15–41)
BILIRUBIN TOTAL: 0.8 mg/dL (ref 0.3–1.2)
BUN: 14 mg/dL (ref 6–20)
CALCIUM: 8.1 mg/dL — AB (ref 8.9–10.3)
CO2: 21 mmol/L — ABNORMAL LOW (ref 22–32)
Chloride: 107 mmol/L (ref 101–111)
Creatinine, Ser: 0.93 mg/dL (ref 0.61–1.24)
GFR calc Af Amer: >60 mL/min (ref >60)
GFR calc non Af Amer: >60 mL/min (ref >60)
GLUCOSE: 116 mg/dL — AB (ref 65–99)
Potassium: 3.2 mmol/L — ABNORMAL LOW (ref 3.5–5.1)
Sodium: 141 mmol/L (ref 135–145)
TOTAL PROTEIN: 7.2 g/dL (ref 6.5–8.1)

## 2017-04-26 LAB — CBC
HCT: 36.3 % — ABNORMAL LOW (ref 39.0–52.0)
Hemoglobin: 12.4 g/dL — ABNORMAL LOW (ref 13.0–17.0)
MCH: 30.9 pg (ref 26.0–34.0)
MCHC: 34.2 g/dL (ref 30.0–36.0)
MCV: 90.5 fL (ref 78.0–100.0)
PLATELETS: 114 10*3/uL — AB (ref 150–400)
RBC: 4.01 MIL/uL — AB (ref 4.22–5.81)
RDW: 16.6 % — ABNORMAL HIGH (ref 11.5–15.5)
WBC: 4 10*3/uL (ref 4.0–10.5)

## 2017-04-26 MED ORDER — RIVAROXABAN 20 MG PO TABS
20.0000 mg | ORAL_TABLET | Freq: Once | ORAL | Status: AC
  Administered 2017-04-26: 20 mg via ORAL
  Filled 2017-04-26: qty 1

## 2017-04-26 MED ORDER — ACETAMINOPHEN 325 MG PO TABS
650.0000 mg | ORAL_TABLET | Freq: Once | ORAL | Status: AC
  Administered 2017-04-26: 650 mg via ORAL
  Filled 2017-04-26: qty 2

## 2017-04-26 NOTE — ED Provider Notes (Signed)
[facility] DEPT Provider Note   CSN: [id number] Arrival date & time: 04/26/17  1433     History   Chief Complaint Chief Complaint  Patient presents with  . intoxicated  . Back Pain    HPI Anthony [name] is a 61 y.o. male.  HPI Patient started his interview by describing his eating habits to me. He stressed that he doesn't overeat because his father had been very obese and that he typically tries to eat healthy but not too much. He does indicate he would like to eat a sandwich here. No vomiting or abdominal pain. Patient however does report that he has some rectal leakage of stool at times and this is becoming very inconvenient because the people that let him live with them, obviously are not thrilled about the situation in his words. Patient also spent significant time describing to me the circumstances around at back injury he sustained about 5 years ago whereupon he directly onto his feet and ended up getting some vertebral body fractures. He reports that he was told there really wasn't anything to be done about it but he always has problems with pain. He reports his doctor will only prescribed Tylenol and a muscle relaxer because of his heavy drinking history. She reports that sometimes is helpful. He reports they work synergistically and if I was to give him only the Tylenol without the muscle relaxer wouldn't help at all. Patient described to me that he got his diagnosis with a blood clot to his lungs and is supposed be taking Xarelto. He complained because he had to wait in the waiting room for about 3 hours and hasn't had Xarelto which he usually takes with a meal.  Patient reports that he knows he drinks a lot. We discussed his alcohol level and he felt that it was incorrect that it would still be high because he had waited in the waiting room for so long and hadn't had a drink for while. He clarified that, "It doesn't matter anyways, I'm always going to drink and that's not going to  change". He continued to describe to me all the problems with the long wait times and how inappropriate it was and how we should either build a bigger hospital or hire  more doctors.   Past Medical History:  Diagnosis Date  . Hydrocele    Surgically corrected  . Hypertension   . Lumbar compression fracture ([place])   . Skin cancer    exciced 2017    Patient Active Problem List   Diagnosis Date Noted  . Dyspnea 11/27/2016  . Actinic keratosis 11/27/2016  . Macrocytic anemia 11/23/2016  . Dry eye 11/23/2016  . Right pulmonary embolus ([place]) 11/02/2016  . Acute renal failure ([place])   . Hiatal hernia 09/14/2016  . Skin cancer 08/13/2016  . Toe pain 07/07/2013  . Poor social situation 06/09/2013  . Tinea versicolor 06/08/2013  . Lower back pain 05/07/2013  . Trigger finger, acquired 11/18/2012  . 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 HTN (hypertension) 04/11/2012  . Alcohol abuse 09/16/2006  . Chronic low back pain 09/16/2006    Past Surgical History:  Procedure Laterality Date  . HIATAL HERNIA REPAIR  2016  . HYDROCELE EXCISION / REPAIR  2010  . SKIN CANCER EXCISION Left    Excised 2017       Home Medications    Prior to Admission medications   Medication Sig Start Date End Date  Taking? Authorizing Provider  acetaminophen (TYLENOL 8 HOUR) 650 MG CR tablet Take 1 tablet (650 mg total) by mouth every 8 (eight) hours as needed for pain. 12/18/16   Mercy Riding, MD  chlordiazePOXIDE (LIBRIUM) 25 MG capsule 50mg  PO TID x 1D, then 25-50mg  PO BID X 1D, then 25-50mg  PO QD X 1D 04/22/17   Deno Etienne, DO  [employer] VITAMIN B12 1000 MCG tablet TAKE 1 TABLET BY MOUTH EVERY DAY 02/01/17   Mercy Riding, MD  ferrous sulfate 325 (65 FE) MG tablet Take 325 mg by mouth 2 (two) times a week.    [provider]  folic acid (FOLVITE) 1 MG tablet Take 1 tablet (1 mg total) by mouth 2 (two) times a week. 11/23/16   Mercy Riding, MD    hydrochlorothiazide (HYDRODIURIL) 25 MG tablet Take 1 tablet (25 mg total) by mouth daily. 11/27/16   Mercy Riding, MD  hydroxypropyl methylcellulose / hypromellose (ISOPTO TEARS / GONIOVISC) 2.5 % ophthalmic solution Place 1 drop into both eyes as needed for dry eyes.    [provider]  methocarbamol (ROBAXIN) 750 MG tablet TAKE 1 TABLET (750 MG TOTAL) BY MOUTH EVERY 8 (EIGHT) HOURS AS NEEDED. 04/05/17   Mercy Riding, MD  Multiple Vitamin (MULTIVITAMIN WITH MINERALS) TABS tablet Take 1 tablet by mouth 2 (two) times a week.    [provider]  Multiple Vitamins-Minerals (PRESERVISION AREDS 2) CAPS Take 2 capsules by mouth 2 (two) times a week. Patient not taking: Reported on 02/17/2017 11/23/16   Mercy Riding, MD  OLANZapine (ZYPREXA) 10 MG tablet TAKE 1 TABLET BY MOUTH EVERYDAY AT BEDTIME Patient not taking: Reported on 04/22/2017 04/12/17   Mercy Riding, MD  thiamine (VITAMIN B-1) 100 MG tablet Take 1 tablet (100 mg total) by mouth 2 (two) times a week. 11/23/16   Mercy Riding, MD  XARELTO 20 MG TABS tablet TAKE 1 TABLET (20 MG TOTAL) BY MOUTH DAILY WITH SUPPER. 03/01/17   Mercy Riding, MD    Family History Family History  Problem Relation Age of Onset  . Heart attack Father        died at 49 years  . Dementia Father   . Stroke Father   . Cancer Sister     Social History Social History  Substance Use Topics  . Smoking status: Never Smoker  . Smokeless tobacco: Never Used  . Alcohol use 25.2 oz/week    42 Cans of beer per week     Allergies   Patient has no known allergies.   Review of Systems Review of Systems 10 Systems reviewed and are negative for acute change except as noted in the HPI.   Physical Exam Updated Vital Signs BP 104/74 (BP Location: Left Arm)   Pulse (!) 127   Temp 98 F (36.7 C) (Oral)   Resp 20   SpO2 98%   Physical Exam  Constitutional: He is oriented to person, place, and time.  Patient is alert and nontoxic. Mental status is  clear. No respiratory distress.  HENT:  Head: Normocephalic and atraumatic.  Patient has a lot of dry eczematous skin of the face. Airway is widely patent.  Eyes: EOM are normal.  Cardiovascular:  Borderline tachycardia. No rub murmur gallop.  Pulmonary/Chest: Effort normal and breath sounds normal.  Abdominal: Soft. Bowel sounds are normal. He exhibits no distension. There is no tenderness. There is no guarding.  Musculoskeletal:  Patient has trace peripheral edema  bilateral lower extremities. No facial asymmetry. Calves are soft and nontender. Skin condition of feet and lower legs is good. No active wounds or cellulitis.  Neurological: He is alert and oriented to person, place, and time. No cranial nerve deficit. He exhibits normal muscle tone. Coordination normal.  Skin: Skin is warm and dry.  Psychiatric: He has a normal mood and affect.     ED Treatments / Results  Labs (all labs ordered are listed, but only abnormal results are displayed) Labs Reviewed  COMPREHENSIVE METABOLIC PANEL - Abnormal; Notable for the following:       Result Value   Potassium 3.2 (*)    CO2 21 (*)    Glucose, Bld 116 (*)    Calcium 8.1 (*)    AST 138 (*)    All other components within normal limits  ETHANOL - Abnormal; Notable for the following:    Alcohol, Ethyl (B) 347 (*)    All other components within normal limits  CBC - Abnormal; Notable for the following:    RBC 4.01 (*)    Hemoglobin 12.4 (*)    HCT 36.3 (*)    RDW 16.6 (*)    Platelets 114 (*)    All other components within normal limits  RAPID URINE DRUG SCREEN, HOSP PERFORMED    EKG  EKG Interpretation None       Radiology No results found.  Procedures Procedures (including critical care time)  Medications Ordered in ED Medications  rivaroxaban (XARELTO) tablet 20 mg (20 mg Oral Given 04/26/17 2010)  acetaminophen (TYLENOL) tablet 650 mg (650 mg Oral Given 04/26/17 2010)     Initial Impression / Assessment and Plan  / ED Course  I have reviewed the triage vital signs and the nursing notes.  Pertinent labs & imaging results that were available during my care of the patient were reviewed by me and considered in my medical decision making (see chart for details).     Final Clinical Impressions(s) / ED Diagnoses   Final diagnoses:  Alcohol abuse, continuous  Chronic midline back pain, unspecified back location  Incontinence of feces, unspecified fecal incontinence type  As outlined above, patient has multiple complaints upon evaluation. I spent considerable time listing to the patient describe all of his concerns and performing examination. Ultimately, to the patient's dismay, I did have to advise him that the encounter was over. Patient ultimately wished to stay in the hospital, seemingly because he is having trouble with some bowel incontinence and the people with whom he is living are not dealing with this well. At this time, I do not find indication for hospitalization or further diagnostic evaluation. The patient is alert and nontoxic. Although alcohol level is in the 300s, this is not a high for him and his insight and mental status are clear. Patient clearly stated he has no intention to ever stop drinking. Patient was given his evening dose of Xarelto and a meal as requested. At this time he is stable for discharge.  New Prescriptions New Prescriptions   No medications on file     Charlesetta Shanks, MD 04/26/17 2101

## 2017-04-26 NOTE — ED Triage Notes (Signed)
Patient brought here by friend. Patient c/o back pain due his "fractured back, I have spot in my lung and I drink to much". When asking patient what he would like for ED to do for him today, he responds, "think I need to be here long time". When asked why, he repeats "got broken back, spot on my lung and drink to much".

## 2017-04-26 NOTE — Telephone Encounter (Signed)
Called to speak with Charge Nurse at [facility], however on hold for about 15 minutes. Derl Barrow, RN

## 2017-04-26 NOTE — Telephone Encounter (Signed)
Patient calling stating he is in the lobby of [facility] ED and getting ready to pass out. He has been waiting for about hour and was told he will have to wait. Patient stated he has been drinking alcoholic drinks today; per patient "they were pretty strong". Patient speech is very slow and slurred at times. Patient stated he will "fall down on the floor and then they will have to put me in medical care." Advised patient that she should wait to be seen, patient stated "he does not want to wait another two hours". 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 He keep repeating he does not want to wait another 2 hours. 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  "They have to service me if I pass out."  Derl Barrow, RN

## 2017-04-26 NOTE — ED Notes (Addendum)
Pt yelling at staff saying "I completely disagree with this! I do not need to be discharged. I came to get help and you only gave me a couple pills and now you're kicking me out." Explained to patient that he has been seen and there is nothing else we can do for him other than provide resources to assist him with alcohol abuse. Pt refused to sign signature pad for discharge and refused to take discharge papers with him. Pt stated "I'm telling my insurance company not to pay this bill and I'm filing a complaint. You don't deserve to be paid the thousands of dollars you're charging for this visit. You didn't provide any services to me and you were negligent." Told patient we are sorry he feels that way but there is nothing else we can do for him regarding his need for help with chronic back pain and alcohol abuse. Pt continued to yell at staff and had to be escorted out by security.

## 2017-04-27 ENCOUNTER — Telehealth: Payer: Self-pay | Admitting: *Deleted

## 2017-04-27 NOTE — Progress Notes (Signed)
Type of Service: [place] phone consult  SUBJECTIVE: Anthony [name] is a 61 y.o. male referred by Elwin Sleight, RN nurse line: resources for treatment with alcohol.  Patient reports he had one beer today.  Patient's speech is slow and slightly slurred.   Duration of problem: over 20 years Current / Hx of substance use: Patient states he drinks as much as he can get  GOALS: Patient will reduce and stop drinking improve:coping skills and self-management skills, and Increase healthy adjustment to current life circumstances.  INTERVENTION:  Supportive Counseling, Reflective listening,, Community Resource and Referral to Substance Abuse Program    ISSUES DISCUSSED: community resources, residential treatment and day program resources as well as transportation. Provided information for [facility], [facility], [facility] and [facility].     PLAN:   1. LCSW will F/U with phone call in 3 days  2.  Referral:Substance Abuse Program,   3.Patient will call all programs provided  Casimer Lanius, LCSW Licensed Clinical Social Worker [place]   [phone] 3:34 PM

## 2017-04-27 NOTE — Telephone Encounter (Signed)
Patient called stating he is need of help with alcohol. He was seen in the ED yesterday, per patient for help with drinking. He has history of anxiety and depression.  Patient transferred to Casimer Lanius, [place] with [facility] to assist.  Derl Barrow, RN

## 2017-04-29 ENCOUNTER — Other Ambulatory Visit: Payer: Self-pay | Admitting: Student

## 2017-04-29 DIAGNOSIS — D539 Nutritional anemia, unspecified: Secondary | ICD-10-CM

## 2017-05-04 ENCOUNTER — Telehealth: Payer: Self-pay | Admitting: Licensed Clinical Social Worker

## 2017-05-04 NOTE — Progress Notes (Signed)
[facility] f/u phone call to patient to see if he was able to connect to resources provided for substance treatment. Left voice message to call LCSW if additional resources are needed.  Plan: LCSW will wait for return call.  Casimer Lanius, LCSW Licensed Clinical Social Worker [place]   [phone] 1:27 PM

## 2017-05-13 ENCOUNTER — Ambulatory Visit: Payer: Self-pay | Admitting: Gastroenterology

## 2017-05-24 ENCOUNTER — Other Ambulatory Visit: Payer: Self-pay | Admitting: Student

## 2017-05-25 ENCOUNTER — Other Ambulatory Visit: Payer: Self-pay | Admitting: Student

## 2017-05-25 DIAGNOSIS — I2699 Other pulmonary embolism without acute cor pulmonale: Secondary | ICD-10-CM

## 2017-05-26 NOTE — Telephone Encounter (Signed)
Called and talked to patient about his refill on Xarelto.  Patient had unprovoked PE about 6 months ago.  He has been on Xarelto since then.  He says he has been out of his Xarelto for one week. He states calling his pharmacy but there was some confusion about his refill.  It is unclear to me where the confusion was. I received his refill request yesterday. Patient has history of alcohol abuse.  He has history of delirium tremens requiring ICU admission when he was diagnosed with PE.  He is still drinking.  He says he still drinks 3 beers a day.  He says "3 beers is nothing".  He is not willing to quit drinking. At this time, I discussed about the risks and benefits of continuing Xarelto.  Benefits include prevention of recurrence of PE/DVT, which could be up to 10% in the first year and then 5% annually after that.  Risk includes but not limited to major bleeding in his head that could lead to death especially with history of alcohol abuse.  Patient voiced understanding about the risks and benefits.  He still likes to continue taking his Xarelto. So, I have refilled his Xarelto today.

## 2017-07-01 ENCOUNTER — Other Ambulatory Visit: Payer: Self-pay | Admitting: Student

## 2017-07-02 ENCOUNTER — Other Ambulatory Visit: Payer: Self-pay | Admitting: Student

## 2017-07-02 MED ORDER — METHOCARBAMOL 750 MG PO TABS: 750.0000 mg | ORAL_TABLET | Freq: Three times a day (TID) | ORAL | 2 refills | Status: DC | PRN

## 2017-07-28 ENCOUNTER — Other Ambulatory Visit: Payer: Self-pay | Admitting: Student

## 2017-07-28 DIAGNOSIS — D539 Nutritional anemia, unspecified: Secondary | ICD-10-CM

## 2017-07-28 MED ORDER — CYANOCOBALAMIN 1000 MCG PO TABS: 1000.0000 ug | ORAL_TABLET | Freq: Every day | ORAL | 3 refills | Status: DC

## 2017-09-01 ENCOUNTER — Emergency Department (HOSPITAL_COMMUNITY): Payer: Medicaid Other

## 2017-09-01 ENCOUNTER — Inpatient Hospital Stay (HOSPITAL_COMMUNITY)
Admission: EM | Admit: 2017-09-01 | Discharge: 2017-09-05 | DRG: 897 | Disposition: A | Discharge status: Home / Self Care | Payer: Medicaid Other | Attending: Family Medicine | Admitting: Family Medicine

## 2017-09-01 ENCOUNTER — Encounter (HOSPITAL_COMMUNITY): Payer: Self-pay

## 2017-09-01 DIAGNOSIS — R4182 Altered mental status, unspecified: Secondary | ICD-10-CM | POA: Diagnosis not present

## 2017-09-01 DIAGNOSIS — I5189 Other ill-defined heart diseases: Secondary | ICD-10-CM | POA: Diagnosis present

## 2017-09-01 DIAGNOSIS — N179 Acute kidney failure, unspecified: Secondary | ICD-10-CM | POA: Diagnosis present

## 2017-09-01 DIAGNOSIS — L821 Other seborrheic keratosis: Secondary | ICD-10-CM | POA: Diagnosis not present

## 2017-09-01 DIAGNOSIS — E872 Acidosis: Secondary | ICD-10-CM | POA: Diagnosis present

## 2017-09-01 DIAGNOSIS — I10 Essential (primary) hypertension: Secondary | ICD-10-CM | POA: Diagnosis present

## 2017-09-01 DIAGNOSIS — F10931 Alcohol use, unspecified with withdrawal delirium: Secondary | ICD-10-CM

## 2017-09-01 DIAGNOSIS — Z7901 Long term (current) use of anticoagulants: Secondary | ICD-10-CM | POA: Diagnosis not present

## 2017-09-01 DIAGNOSIS — F10239 Alcohol dependence with withdrawal, unspecified: Secondary | ICD-10-CM | POA: Diagnosis not present

## 2017-09-01 DIAGNOSIS — I361 Nonrheumatic tricuspid (valve) insufficiency: Secondary | ICD-10-CM | POA: Diagnosis not present

## 2017-09-01 DIAGNOSIS — E876 Hypokalemia: Secondary | ICD-10-CM | POA: Diagnosis present

## 2017-09-01 DIAGNOSIS — R0600 Dyspnea, unspecified: Secondary | ICD-10-CM | POA: Diagnosis present

## 2017-09-01 DIAGNOSIS — G8929 Other chronic pain: Secondary | ICD-10-CM | POA: Diagnosis present

## 2017-09-01 DIAGNOSIS — R0602 Shortness of breath: Secondary | ICD-10-CM

## 2017-09-01 DIAGNOSIS — Z85828 Personal history of other malignant neoplasm of skin: Secondary | ICD-10-CM | POA: Diagnosis not present

## 2017-09-01 DIAGNOSIS — F10231 Alcohol dependence with withdrawal delirium: Principal | ICD-10-CM | POA: Diagnosis present

## 2017-09-01 DIAGNOSIS — F10939 Alcohol use, unspecified with withdrawal, unspecified: Secondary | ICD-10-CM | POA: Diagnosis present

## 2017-09-01 DIAGNOSIS — M549 Dorsalgia, unspecified: Secondary | ICD-10-CM | POA: Diagnosis present

## 2017-09-01 DIAGNOSIS — Z86711 Personal history of pulmonary embolism: Secondary | ICD-10-CM | POA: Diagnosis not present

## 2017-09-01 DIAGNOSIS — G9340 Encephalopathy, unspecified: Secondary | ICD-10-CM | POA: Diagnosis present

## 2017-09-01 DIAGNOSIS — L219 Seborrheic dermatitis, unspecified: Secondary | ICD-10-CM | POA: Diagnosis not present

## 2017-09-01 DIAGNOSIS — D61818 Other pancytopenia: Secondary | ICD-10-CM

## 2017-09-01 HISTORY — DX: Alcohol use, unspecified with withdrawal delirium: F10.931

## 2017-09-01 HISTORY — DX: Alcohol dependence with withdrawal delirium: F10.231

## 2017-09-01 LAB — URINALYSIS, ROUTINE W REFLEX MICROSCOPIC
Bilirubin Urine: NEGATIVE
Glucose, UA: NEGATIVE mg/dL
Hgb urine dipstick: NEGATIVE
KETONES UR: 20 mg/dL — AB
LEUKOCYTES UA: NEGATIVE
NITRITE: NEGATIVE
PH: 6 (ref 5.0–8.0)
PROTEIN: NEGATIVE mg/dL
Specific Gravity, Urine: 1.018 (ref 1.005–1.030)

## 2017-09-01 LAB — RAPID URINE DRUG SCREEN, HOSP PERFORMED
Amphetamines: NOT DETECTED
BENZODIAZEPINES: NOT DETECTED
Barbiturates: NOT DETECTED
COCAINE: NOT DETECTED
OPIATES: NOT DETECTED
TETRAHYDROCANNABINOL: NOT DETECTED

## 2017-09-01 LAB — COMPREHENSIVE METABOLIC PANEL
ALT: 66 U/L — ABNORMAL HIGH (ref 17–63)
ANION GAP: 17 — AB (ref 5–15)
AST: 101 U/L — ABNORMAL HIGH (ref 15–41)
Albumin: 3.9 g/dL (ref 3.5–5.0)
Alkaline Phosphatase: 80 U/L (ref 38–126)
BUN: 18 mg/dL (ref 6–20)
CHLORIDE: 98 mmol/L — AB (ref 101–111)
CO2: 16 mmol/L — ABNORMAL LOW (ref 22–32)
CREATININE: 1.26 mg/dL — AB (ref 0.61–1.24)
Calcium: 9.6 mg/dL (ref 8.9–10.3)
GFR, EST NON AFRICAN AMERICAN: 60 mL/min — AB (ref >60)
Glucose, Bld: 86 mg/dL (ref 65–99)
POTASSIUM: 2.9 mmol/L — AB (ref 3.5–5.1)
SODIUM: 131 mmol/L — AB (ref 135–145)
Total Bilirubin: 1 mg/dL (ref 0.3–1.2)
Total Protein: 7.5 g/dL (ref 6.5–8.1)

## 2017-09-01 LAB — CBC
HEMATOCRIT: 35 % — AB (ref 39.0–52.0)
HEMOGLOBIN: 12 g/dL — AB (ref 13.0–17.0)
MCH: 33 pg (ref 26.0–34.0)
MCHC: 34.3 g/dL (ref 30.0–36.0)
MCV: 96.2 fL (ref 78.0–100.0)
PLATELETS: 119 10*3/uL — AB (ref 150–400)
RBC: 3.64 MIL/uL — AB (ref 4.22–5.81)
RDW: 16.8 % — ABNORMAL HIGH (ref 11.5–15.5)
WBC: 5.1 10*3/uL (ref 4.0–10.5)

## 2017-09-01 LAB — CBG MONITORING, ED: Glucose-Capillary: 89 mg/dL (ref 65–99)

## 2017-09-01 LAB — ACETAMINOPHEN LEVEL: Acetaminophen (Tylenol), Serum: <10 ug/mL — ABNORMAL LOW (ref 10–30)

## 2017-09-01 LAB — LIPASE, BLOOD: Lipase: 35 U/L (ref 11–51)

## 2017-09-01 LAB — ETHANOL: Alcohol, Ethyl (B): <10 mg/dL (ref <10)

## 2017-09-01 LAB — AMMONIA: AMMONIA: 26 umol/L (ref 9–35)

## 2017-09-01 MED ORDER — SODIUM CHLORIDE 0.9 % IV SOLN
INTRAVENOUS | Status: DC
  Administered 2017-09-01: 22:00:00 via INTRAVENOUS

## 2017-09-01 MED ORDER — LORAZEPAM 2 MG/ML IJ SOLN: 2.0000 mg | Freq: Once | INTRAMUSCULAR | Status: DC

## 2017-09-01 MED ORDER — SODIUM CHLORIDE 0.9 % IV SOLN: 1.0000 mg | Freq: Once | INTRAVENOUS | Status: DC

## 2017-09-01 MED ORDER — LORAZEPAM 2 MG/ML IJ SOLN
2.0000 mg | Freq: Once | INTRAMUSCULAR | Status: AC
  Administered 2017-09-01 (×2): 2 mg via INTRAVENOUS
  Filled 2017-09-01: qty 1

## 2017-09-01 MED ORDER — SODIUM CHLORIDE 0.9 % IV SOLN: 250.0000 mL | INTRAVENOUS | Status: DC | PRN

## 2017-09-01 MED ORDER — HEPARIN SODIUM (PORCINE) 5000 UNIT/ML IJ SOLN
5000.0000 [IU] | Freq: Three times a day (TID) | INTRAMUSCULAR | Status: DC
  Administered 2017-09-01 – 2017-09-04 (×8): 5000 [IU] via SUBCUTANEOUS
  Filled 2017-09-01 (×9): qty 1

## 2017-09-01 MED ORDER — LORAZEPAM 2 MG/ML IJ SOLN
2.0000 mg | Freq: Once | INTRAMUSCULAR | Status: AC
  Administered 2017-09-01: 2 mg via INTRAVENOUS
  Filled 2017-09-01: qty 1

## 2017-09-01 MED ORDER — SODIUM CHLORIDE 0.9 % IV SOLN
0.2000 ug/kg/h | INTRAVENOUS | Status: DC
  Administered 2017-09-01: 0.2 ug/kg/h via INTRAVENOUS
  Administered 2017-09-02: 0.5 ug/kg/h via INTRAVENOUS
  Filled 2017-09-01 (×4): qty 2

## 2017-09-01 MED ORDER — LORAZEPAM 2 MG/ML IJ SOLN
INTRAMUSCULAR | Status: AC
  Administered 2017-09-01: 2 mg via INTRAVENOUS
  Filled 2017-09-01: qty 1

## 2017-09-01 MED ORDER — POTASSIUM CHLORIDE CRYS ER 20 MEQ PO TBCR
40.0000 meq | EXTENDED_RELEASE_TABLET | Freq: Once | ORAL | Status: AC
  Administered 2017-09-01: 40 meq via ORAL
  Filled 2017-09-01: qty 2

## 2017-09-01 MED ORDER — LORAZEPAM 2 MG/ML IJ SOLN: 2.0000 mg | Freq: Once | INTRAMUSCULAR | Status: DC | PRN

## 2017-09-01 MED ORDER — LORAZEPAM 2 MG/ML IJ SOLN
INTRAMUSCULAR | Status: AC
  Filled 2017-09-01: qty 1

## 2017-09-01 MED ORDER — LORAZEPAM 2 MG/ML IJ SOLN
1.0000 mg | Freq: Three times a day (TID) | INTRAMUSCULAR | Status: DC
  Administered 2017-09-01: 1 mg via INTRAVENOUS
  Filled 2017-09-01: qty 1

## 2017-09-01 MED ORDER — SODIUM CHLORIDE 0.9 % IV BOLUS (SEPSIS)
500.0000 mL | Freq: Once | INTRAVENOUS | Status: AC
  Administered 2017-09-01: 500 mL via INTRAVENOUS

## 2017-09-01 MED ORDER — FOLIC ACID 5 MG/ML IJ SOLN
1.0000 mg | Freq: Every day | INTRAMUSCULAR | Status: DC
  Administered 2017-09-02: 1 mg via INTRAVENOUS
  Filled 2017-09-01 (×2): qty 0.2

## 2017-09-01 MED ORDER — THIAMINE HCL 100 MG/ML IJ SOLN
100.0000 mg | Freq: Every day | INTRAMUSCULAR | Status: DC
  Administered 2017-09-02: 100 mg via INTRAVENOUS
  Filled 2017-09-01 (×2): qty 1

## 2017-09-01 MED ORDER — LORAZEPAM 2 MG/ML IJ SOLN
2.0000 mg | Freq: Once | INTRAMUSCULAR | Status: AC
  Administered 2017-09-01: 2 mg via INTRAVENOUS

## 2017-09-01 MED ORDER — SODIUM CHLORIDE 0.9 % IV SOLN
INTRAVENOUS | Status: DC
  Administered 2017-09-01: 14:00:00 via INTRAVENOUS

## 2017-09-01 NOTE — ED Notes (Signed)
The pt does not follow commands.  He is not able to respond to questions asked  He is talking to people in his head  He is in constant motion

## 2017-09-01 NOTE — ED Notes (Signed)
P;t sleeping at present  ? Last dose of precedex iv

## 2017-09-01 NOTE — ED Notes (Signed)
Pt naked not listening to anyone prewsent  He is talking to other people that are not here. Medicine not helping.  Jittery  And is floundering all over the bed

## 2017-09-01 NOTE — ED Provider Notes (Signed)
[place] EMERGENCY DEPARTMENT Provider Note   CSN: [id number] Arrival date & time: 09/01/17  5993     History   Chief Complaint Chief Complaint  Patient presents with  . Altered Mental Status    HPI Dona A Meschke is a 62 y.o. male.  Patient brought in by his roommate for confusion.  His roommate stated he heard him kind of talking throughout most of the night.  Patient arrived with follow all commands was confused but was pleasant and was cooperative.  No significant vital sign abnormalities.  Patient denied any complaints.  There is a history and chart review use.  According to patient's roommate he was fine yesterday.  And his roommate is never seen him like this.      Past Medical History:  Diagnosis Date  . Hydrocele    Surgically corrected  . Hypertension   . Lumbar compression fracture ([place])   . Skin cancer    exciced 2017    Patient Active Problem List   Diagnosis Date Noted  . Alcohol withdrawal ([place]) 09/01/2017  . Dyspnea 11/27/2016  . Actinic keratosis 11/27/2016  . Macrocytic anemia 11/23/2016  . Dry eye 11/23/2016  . Right pulmonary embolus ([place]) 11/02/2016  . Acute renal failure ([place])   . Hiatal hernia 09/14/2016  . Skin cancer 08/13/2016  . Toe pain 07/07/2013  . Poor social situation 06/09/2013  . Tinea versicolor 06/08/2013  . Lower back pain 05/07/2013  . Trigger finger, acquired 11/18/2012  . 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 HTN (hypertension) 04/11/2012  . Alcohol abuse 09/16/2006  . Chronic low back pain 09/16/2006    Past Surgical History:  Procedure Laterality Date  . HIATAL HERNIA REPAIR  2016  . HYDROCELE EXCISION / REPAIR  2010  . SKIN CANCER EXCISION Left    Excised 2017       Home Medications    Prior to Admission medications   Medication Sig Start Date End Date Taking? Authorizing Provider  acetaminophen (TYLENOL) 650 MG CR tablet TAKE 1 TABLET  (650 MG TOTAL) BY MOUTH EVERY 8 (EIGHT) HOURS AS NEEDED FOR PAIN. 05/24/17  Yes Mercy Riding, MD  cyanocobalamin ([employer] VITAMIN B12) 1000 MCG tablet Take 1 tablet (1,000 mcg total) by mouth daily. 07/28/17  Yes Mercy Riding, MD  folic acid (FOLVITE) 1 MG tablet Take 1 tablet (1 mg total) by mouth 2 (two) times a week. 11/23/16  Yes Mercy Riding, MD  hydrochlorothiazide (HYDRODIURIL) 25 MG tablet Take 1 tablet (25 mg total) by mouth daily. 11/27/16  Yes Mercy Riding, MD  Multiple Vitamins-Minerals (MULTIVITAMIN WITH MINERALS) tablet Take 1 tablet by mouth daily.   Yes [provider]  thiamine (VITAMIN B-1) 100 MG tablet Take 1 tablet (100 mg total) by mouth 2 (two) times a week. 11/23/16  Yes Gonfa, Taye T, MD  XARELTO 20 MG TABS tablet TAKE 1 TABLET (20 MG TOTAL) BY MOUTH DAILY WITH SUPPER. 05/26/17  Yes Mercy Riding, MD  chlordiazePOXIDE (LIBRIUM) 25 MG capsule 50mg  PO TID x 1D, then 25-50mg  PO BID X 1D, then 25-50mg  PO QD X 1D Patient not taking: Reported on 09/01/2017 04/22/17   Deno Etienne, DO  ferrous sulfate 325 (65 FE) MG tablet Take 325 mg by mouth 2 (two) times a week.    [provider]  hydroxypropyl methylcellulose / hypromellose (ISOPTO TEARS / GONIOVISC) 2.5 % ophthalmic solution Place 1 drop  into both eyes as needed for dry eyes.    [provider]  methocarbamol (ROBAXIN) 750 MG tablet Take 1 tablet (750 mg total) by mouth every 8 (eight) hours as needed. Patient not taking: Reported on 09/01/2017 07/02/17   Mercy Riding, MD  Multiple Vitamin (MULTIVITAMIN WITH MINERALS) TABS tablet Take 1 tablet by mouth 2 (two) times a week.    [provider]  Multiple Vitamins-Minerals (PRESERVISION AREDS 2) CAPS Take 2 capsules by mouth 2 (two) times a week. Patient not taking: Reported on 09/01/2017 11/23/16   Mercy Riding, MD    Family History Family History  Problem Relation Age of Onset  . Heart attack Father        died at 34 years  . Dementia Father   .  Stroke Father   . Cancer Sister     Social History Social History   Tobacco Use  . Smoking status: Never Smoker  . Smokeless tobacco: Never Used  Substance Use Topics  . Alcohol use: Yes    Alcohol/week: 25.2 oz    Types: 42 Cans of beer per week  . Drug use: Yes    Types: Marijuana    Comment: occasional     Allergies   Patient has no known allergies.   Review of Systems Review of Systems  Constitutional: Negative for fever.  HENT: Negative for congestion.   Respiratory: Negative for cough and shortness of breath.   Cardiovascular: Negative for chest pain.  Gastrointestinal: Negative for abdominal pain, nausea and vomiting.  Genitourinary: Negative for dysuria.  Musculoskeletal: Negative for myalgias.  Skin: Negative for rash.  Neurological: Negative for headaches.  Hematological: Does not bruise/bleed easily.  Psychiatric/Behavioral: Positive for confusion.     Physical Exam Updated Vital Signs BP (!) 129/92   Pulse (!) 144   Temp 97.6 F (36.4 C) (Oral)   Resp 16   Ht 1.778 m (5\' 10" )   Wt 81.6 kg (180 lb)   SpO2 97%   BMI 25.83 kg/m   Physical Exam  Constitutional: He appears well-developed and well-nourished. No distress.  HENT:  Head: Normocephalic and atraumatic.  Mouth/Throat: Oropharynx is clear and moist.  Eyes: Conjunctivae and EOM are normal. Pupils are equal, round, and reactive to light.  Neck: Normal range of motion. Neck supple.  Cardiovascular: Normal rate, regular rhythm and normal heart sounds.  Pulmonary/Chest: Effort normal and breath sounds normal.  Abdominal: Soft. Bowel sounds are normal.  Musculoskeletal: Normal range of motion. He exhibits no edema.  Neurological: He is alert. No cranial nerve deficit or sensory deficit. He exhibits normal muscle tone. Coordination normal.  Patient clearly is confused.  But not anxious he is cooperative and will follow all commands.  Skin: Skin is warm.  Nursing note and vitals  reviewed.    ED Treatments / Results  Labs (all labs ordered are listed, but only abnormal results are displayed) Labs Reviewed  COMPREHENSIVE METABOLIC PANEL - Abnormal; Notable for the following components:      Result Value   Sodium 131 (*)    Potassium 2.9 (*)    Chloride 98 (*)    CO2 16 (*)    Creatinine, Ser 1.26 (*)    AST 101 (*)    ALT 66 (*)    GFR calc non Af Amer 60 (*)    Anion gap 17 (*)    All other components within normal limits  CBC - Abnormal; Notable for the following components:  RBC 3.64 (*)    Hemoglobin 12.0 (*)    HCT 35.0 (*)    RDW 16.8 (*)    Platelets 119 (*)    All other components within normal limits  URINALYSIS, ROUTINE W REFLEX MICROSCOPIC - Abnormal; Notable for the following components:   Ketones, ur 20 (*)    All other components within normal limits  ACETAMINOPHEN LEVEL - Abnormal; Notable for the following components:   Acetaminophen (Tylenol), Serum <10 (*)    All other components within normal limits  ETHANOL  RAPID URINE DRUG SCREEN, HOSP PERFORMED  LIPASE, BLOOD  AMMONIA  CBG MONITORING, ED    EKG  EKG Interpretation  Date/Time:  Wednesday September 01 2017 08:27:56 EST Ventricular Rate:  127 PR Interval:  152 QRS Duration: 84 QT Interval:  318 QTC Calculation: 462 R Axis:   67 Text Interpretation:  Sinus tachycardia Otherwise normal ECG No significant change since last tracing Confirmed by Fredia Sorrow [phone]) on 09/01/2017 12:15:52 PM       Radiology Dg Chest 2 View  Result Date: 09/01/2017 CLINICAL DATA:  Shortness of breath with altered mental status EXAM: CHEST  2 VIEW COMPARISON:  Chest radiograph April 22, 2017 and chest CT April 22, 2017 FINDINGS: No edema or consolidation. Heart size and pulmonary vascularity are normal. No adenopathy. No evident bone lesions. IMPRESSION: No edema or consolidation. Electronically Signed   By: Lowella Grip III M.D.   On: 09/01/2017 13:31   Ct Head Wo  Contrast  Result Date: 09/01/2017 CLINICAL DATA:  Confusion and hallucinations EXAM: CT HEAD WITHOUT CONTRAST TECHNIQUE: Contiguous axial images were obtained from the base of the skull through the vertex without intravenous contrast. COMPARISON:  Head CT August 05, 2012. FINDINGS: Brain: There is mild diffuse atrophy with moderate cerebellar atrophy. There is no intracranial mass, hemorrhage, extra-axial fluid collection, or midline shift. Gray-white compartments appear normal. No evident acute infarct. Vascular: No hyperdense vessels. There is mild calcification in each carotid siphon region. Skull: The bony calvarium appears intact. Sinuses/Orbits: There is mucosal thickening in several ethmoid air cells bilaterally. There is rightward deviation of the nasal septum. Orbits appear symmetric bilaterally. Other: Mastoid air cells are clear. IMPRESSION: Mild supratentorial atrophy with moderate cerebellar atrophy. Gray-white compartments appear normal. No mass or hemorrhage. There is mild arteriovascular calcification in the carotid siphon regions. There is rightward deviation of the nasal septum. There is mucosal thickening in several ethmoid air cells. Electronically Signed   By: Lowella Grip III M.D.   On: 09/01/2017 13:16    Procedures Procedures (including critical care time)   CRITICAL CARE Performed by: Fredia Sorrow Total critical care time: 45 minutes Critical care time was exclusive of separately billable procedures and treating other patients. Critical care was necessary to treat or prevent imminent or life-threatening deterioration. Critical care was time spent personally by me on the following activities: development of treatment plan with patient and/or surrogate as well as nursing, discussions with consultants, evaluation of patient's response to treatment, examination of patient, obtaining history from patient or surrogate, ordering and performing treatments and interventions,  ordering and review of laboratory studies, ordering and review of radiographic studies, pulse oximetry and re-evaluation of patient's condition.   Medications Ordered in ED Medications  0.9 %  sodium chloride infusion ( Intravenous New Bag/Given 09/01/17 1339)  LORazepam (ATIVAN) injection 2 mg (not administered)  sodium chloride 0.9 % bolus 500 mL (0 mLs Intravenous Stopped 09/01/17 1427)  potassium chloride SA (K-DUR,KLOR-CON) CR tablet 40  mEq (40 mEq Oral Given 09/01/17 1421)  LORazepam (ATIVAN) injection 2 mg (2 mg Intravenous Given 09/01/17 1511)  LORazepam (ATIVAN) injection 2 mg (2 mg Intravenous Given 09/01/17 1642)     Initial Impression / Assessment and Plan / ED Course  I have reviewed the triage vital signs and the nursing notes.  Pertinent labs & imaging results that were available during my care of the patient were reviewed by me and considered in my medical decision making (see chart for details).     Initial part of workup proceeded as an altered mental status workup.  New about his past history of alcohol abuse.  Urine drug screen was negative alcohol was 0.  Initial workup negative so patient was going to get an MRI with claustrophobic and needs Ativan.  So he was ordered to milligrams of Ativan for the MRI.  Got to the MRI scanner did not get MRI completed and started to get very anxious.  So when he returned we thought perhaps may be it was just an anxiety attack.  But this persisted.  He got some more Ativan settled down family medicine was contacted because he is followed by them.  Patient continued to get worse got a total of 6 mg of Ativan throughout the day some improvement but family medicine stated that the withdrawal in the hemodynamics of his heart rate going up to 160s down to 120s was too much to admit to stepdown unit.  So ICU admit alcohol withdrawal orders were placed.  And critical care was contacted and they have agreed to see the patient and admit.  Patient will  receive another 2 units of Ativan and then the nurse will follow the protocol.  Patient still alert  Is tremorous is picking at things is not easy to redirect.  But he is mentally the same as when he arrived.  Final Clinical Impressions(s) / ED Diagnoses   Final diagnoses:  Altered mental status, unspecified altered mental status type  Delirium tremens ([place])  Alcohol withdrawal syndrome, with delirium [facility])    ED Discharge Orders    None       Fredia Sorrow, MD 09/01/17 1818

## 2017-09-01 NOTE — ED Notes (Signed)
Transported to MRI

## 2017-09-01 NOTE — ED Triage Notes (Signed)
Per Pt, Pt is coming from home with complaints of confusion and altered mental status that started when patient woke up. Family reports talking with people who were not present. Patient complains of SOB. Pt has a hx of broken back and PE.

## 2017-09-01 NOTE — ED Notes (Signed)
ED Provider at bedside. 

## 2017-09-01 NOTE — ED Notes (Signed)
Pt asleep from the medication pulls away from stimulation moves all extremities  Withdraws from pain

## 2017-09-01 NOTE — ED Notes (Signed)
[place], RN

## 2017-09-01 NOTE — ED Notes (Signed)
The  Pt is asleep and when he rouses he does not attempt to answer questions or follow commands.  He has calmed down with sleeping with the precedex and his bp has lowered.

## 2017-09-01 NOTE — H&P (Signed)
PULMONARY / CRITICAL CARE MEDICINE   Name: Anthony [name] MRN: [id number] DOB: [birth year]    ADMISSION DATE:  09/01/2017 CONSULTATION DATE:  09/01/2017  REFERRING MD:  Dr Rogene Houston  CHIEF COMPLAINT:  Acute encephalopathy/ETOH withdrawal  HISTORY OF PRESENT ILLNESS:   62 year old male with PMH as below, which is significant for HTN, PE on xarelto, and alcohol abuse.   who was brought into the emergency department on 2/13 by his roommate with complaints of confusion. Apparently had been talking all throughout the night the night before which is uncommon and awoke from sleep in a confused state.  Upon arrival to the emergency department he remained confused but was pleasant.   he underwent a CT which was acutely negative, and was to have MRI but was unable to tolerate due to agitation despite benzodiazepine dosing.  Of note UDS negative and alcohol level 0 and a known drinker.  After receiving 6 mg of Ativan he remained confused and agitated requiring Precedex infusion for suspected alcohol withdrawal.  PCCM asked to admit ICU.  PAST MEDICAL HISTORY :  He  has a past medical history of Hydrocele, Hypertension, Lumbar compression fracture ([place]), and Skin cancer.  PAST SURGICAL HISTORY: He  has a past surgical history that includes Hydrocele excision / repair (2010); Hiatal hernia repair (2016); and Skin cancer excision ([place]).  No Known Allergies  No current facility-administered medications on file prior to encounter.    Current Outpatient Medications on File Prior to Encounter  Medication Sig  . acetaminophen (TYLENOL) 650 MG CR tablet TAKE 1 TABLET (650 MG TOTAL) BY MOUTH EVERY 8 (EIGHT) HOURS AS NEEDED FOR PAIN.  . cyanocobalamin ([employer] VITAMIN B12) 1000 MCG tablet Take 1 tablet (1,000 mcg total) by mouth daily.  . folic acid (FOLVITE) 1 MG tablet Take 1 tablet (1 mg total) by mouth 2 (two) times a week.  . hydrochlorothiazide (HYDRODIURIL) 25 MG tablet Take 1 tablet (25 mg total) by mouth  daily.  . Multiple Vitamins-Minerals (MULTIVITAMIN WITH MINERALS) tablet Take 1 tablet by mouth daily.  Marland Kitchen thiamine (VITAMIN B-1) 100 MG tablet Take 1 tablet (100 mg total) by mouth 2 (two) times a week.  Alveda Reasons 20 MG TABS tablet TAKE 1 TABLET (20 MG TOTAL) BY MOUTH DAILY WITH SUPPER.  . chlordiazePOXIDE (LIBRIUM) 25 MG capsule 50mg  PO TID x 1D, then 25-50mg  PO BID X 1D, then 25-50mg  PO QD X 1D (Patient not taking: Reported on 09/01/2017)  . ferrous sulfate 325 (65 FE) MG tablet Take 325 mg by mouth 2 (two) times a week.  . hydroxypropyl methylcellulose / hypromellose (ISOPTO TEARS / GONIOVISC) 2.5 % ophthalmic solution Place 1 drop into both eyes as needed for dry eyes.  . methocarbamol (ROBAXIN) 750 MG tablet Take 1 tablet (750 mg total) by mouth every 8 (eight) hours as needed. (Patient not taking: Reported on 09/01/2017)  . Multiple Vitamin (MULTIVITAMIN WITH MINERALS) TABS tablet Take 1 tablet by mouth 2 (two) times a week.  . Multiple Vitamins-Minerals (PRESERVISION AREDS 2) CAPS Take 2 capsules by mouth 2 (two) times a week. (Patient not taking: Reported on 09/01/2017)    FAMILY HISTORY:  His indicated that his mother is deceased. He indicated that his father is deceased. He indicated that his sister is alive. He indicated that his brother is alive.   SOCIAL HISTORY: He  reports that  has never smoked. he has never used smokeless tobacco. He reports that he drinks about 25.2 oz of alcohol per  week. He reports that he uses drugs. Drug: Marijuana.  REVIEW OF SYSTEMS:   Unable as patient is encephalopathic  SUBJECTIVE:    VITAL SIGNS: BP (!) 118/93   Pulse (!) 114   Temp 97.6 F (36.4 C) (Oral)   Resp 17   Ht 5\' 10"  (1.778 m)   Wt 81.6 kg (180 lb)   SpO2 100%   BMI 25.83 kg/m   HEMODYNAMICS:    VENTILATOR SETTINGS:    INTAKE / OUTPUT: I/O last 3 completed shifts: In: 72 [I.V.:490] Out: -   PHYSICAL EXAMINATION: General:  Male appears older than stated age in  NAD Neuro:  RASS -1, agitated when aroused. Does not follow commands.  HEENT:  [place]/AT, PERRL, no JVD Cardiovascular:  RRR, no MRG Lungs:   Clear bilateral breath sounds Abdomen:  Soft, non-tender, non-distende Musculoskeletal:  No acute deformity. Moves all 4 extremities with good strength.  Skin:  Grossly intact.   LABS:  BMET Recent Labs  Lab 09/01/17 0833  NA 131*  K 2.9*  CL 98*  CO2 16*  BUN 18  CREATININE 1.26*  GLUCOSE 86    Electrolytes Recent Labs  Lab 09/01/17 0833  CALCIUM 9.6    CBC Recent Labs  Lab 09/01/17 0833  WBC 5.1  HGB 12.0*  HCT 35.0*  PLT 119*    Coag's No results for input(s): APTT, INR in the last 168 hours.  Sepsis Markers No results for input(s): LATICACIDVEN, PROCALCITON, O2SATVEN in the last 168 hours.  ABG No results for input(s): PHART, PCO2ART, PO2ART in the last 168 hours.  Liver Enzymes Recent Labs  Lab 09/01/17 0833  AST 101*  ALT 66*  ALKPHOS 80  BILITOT 1.0  ALBUMIN 3.9    Cardiac Enzymes No results for input(s): TROPONINI, PROBNP in the last 168 hours.  Glucose Recent Labs  Lab 09/01/17 0845  GLUCAP 89    Imaging Dg Chest 2 View  Result Date: 09/01/2017 CLINICAL DATA:  Shortness of breath with altered mental status EXAM: CHEST  2 VIEW COMPARISON:  Chest radiograph April 22, 2017 and chest CT April 22, 2017 FINDINGS: No edema or consolidation. Heart size and pulmonary vascularity are normal. No adenopathy. No evident bone lesions. IMPRESSION: No edema or consolidation. Electronically Signed   By: Lowella Grip III M.D.   On: 09/01/2017 13:31   Ct Head Wo Contrast  Result Date: 09/01/2017 CLINICAL DATA:  Confusion and hallucinations EXAM: CT HEAD WITHOUT CONTRAST TECHNIQUE: Contiguous axial images were obtained from the base of the skull through the vertex without intravenous contrast. COMPARISON:  Head CT August 05, 2012. FINDINGS: Brain: There is mild diffuse atrophy with moderate cerebellar  atrophy. There is no intracranial mass, hemorrhage, extra-axial fluid collection, or midline shift. Gray-white compartments appear normal. No evident acute infarct. Vascular: No hyperdense vessels. There is mild calcification in each carotid siphon region. Skull: The bony calvarium appears intact. Sinuses/Orbits: There is mucosal thickening in several ethmoid air cells bilaterally. There is rightward deviation of the nasal septum. Orbits appear symmetric bilaterally. Other: Mastoid air cells are clear. IMPRESSION: Mild supratentorial atrophy with moderate cerebellar atrophy. Gray-white compartments appear normal. No mass or hemorrhage. There is mild arteriovascular calcification in the carotid siphon regions. There is rightward deviation of the nasal septum. There is mucosal thickening in several ethmoid air cells. Electronically Signed   By: Lowella Grip III M.D.   On: 09/01/2017 13:16    STUDIES:  CT head 2/13 > Mild supratentorial atrophy with moderate cerebellar  atrophy. Gray-white compartments appear normal. No mass or hemorrhage. There is mild arteriovascular calcification in the carotid siphon regions. There is rightward deviation of the nasal septum. There is mucosal thickening in several ethmoid air cells.  CULTURES:   ANTIBIOTICS:   SIGNIFICANT EVENTS: 2/13 admit  LINES/TUBES:   DISCUSSION: 62 year old male who is a known alcohol abuser with history of PE (09/2016) on xarelto. Admitted 2/13 with AMS presumed to be secondary to ETOH withdrawal as the rest of the acute workup was negative. His agitation persisted despite benzos and he was started on Precedex infusions, therefore was admitted to ICU.  ASSESSMENT / PLAN:  Acute encephalopathy: presumed secondary to ETOH withdrawal in a known drinker with an alcohol level of 0 on presentation. CT of the head negative for acute etiology. Unable to obtain MRI due to agitation. UDS negative. - Admit to ICU for close monitoring -  Precedex infusion for RASS goal 0 to -1.  - Scheduled and PRN ativan - Thiamine, folate.   PE: diagnosed in March of 2018. Has been on xarelto since that time. CTA in 04/2017 negative for PE. As far as I can tell he is still taking Xarelto.  - Will hold anticoagulation as has been nearly a year, with no evidence of former thromboembolism that I can find.   HTN: - Telemetry monitoring - Holding home HCTZ while NPO  Hypokalemia - Supp K  AKI - Hydrate and monitor  Elevated anion gap metabolic acidosis - Ketones in urine. Alcoholic ketoacidosis? Renal failure? Other ingestion? - Check beta hydroxybutyric acid, ASA, Volatile panel, lactic acid, ethylene glycol. - Hydrate  Georgann Housekeeper, AGACNP-BC [facility] Pager [phone] or [phone]  09/01/2017 8:40 PM

## 2017-09-02 LAB — BASIC METABOLIC PANEL
ANION GAP: 15 (ref 5–15)
Anion gap: 11 (ref 5–15)
BUN: 11 mg/dL (ref 6–20)
BUN: 9 mg/dL (ref 6–20)
CHLORIDE: 106 mmol/L (ref 101–111)
CO2: 18 mmol/L — AB (ref 22–32)
CO2: 18 mmol/L — AB (ref 22–32)
CREATININE: 0.74 mg/dL (ref 0.61–1.24)
Calcium: 8.9 mg/dL (ref 8.9–10.3)
Calcium: 8.4 mg/dL — ABNORMAL LOW (ref 8.9–10.3)
Chloride: 102 mmol/L (ref 101–111)
Creatinine, Ser: 0.74 mg/dL (ref 0.61–1.24)
GFR calc Af Amer: >60 mL/min (ref >60)
GFR calc non Af Amer: >60 mL/min (ref >60)
GFR calc non Af Amer: >60 mL/min (ref >60)
GLUCOSE: 88 mg/dL (ref 65–99)
GLUCOSE: 100 mg/dL — AB (ref 65–99)
POTASSIUM: 2.8 mmol/L — AB (ref 3.5–5.1)
Potassium: 3.7 mmol/L (ref 3.5–5.1)
Sodium: 135 mmol/L (ref 135–145)
Sodium: 135 mmol/L (ref 135–145)

## 2017-09-02 LAB — BETA-HYDROXYBUTYRIC ACID: BETA-HYDROXYBUTYRIC ACID: 2.67 mmol/L — AB (ref 0.05–0.27)

## 2017-09-02 LAB — CBC
HCT: 33.3 % — ABNORMAL LOW (ref 39.0–52.0)
HEMATOCRIT: 33.7 % — AB (ref 39.0–52.0)
HEMOGLOBIN: 11.5 g/dL — AB (ref 13.0–17.0)
HEMOGLOBIN: 11.4 g/dL — AB (ref 13.0–17.0)
MCH: 33.6 pg (ref 26.0–34.0)
MCH: 32.7 pg (ref 26.0–34.0)
MCHC: 34.5 g/dL (ref 30.0–36.0)
MCHC: 33.8 g/dL (ref 30.0–36.0)
MCV: 97.4 fL (ref 78.0–100.0)
MCV: 96.6 fL (ref 78.0–100.0)
Platelets: 107 10*3/uL — ABNORMAL LOW (ref 150–400)
Platelets: 103 10*3/uL — ABNORMAL LOW (ref 150–400)
RBC: 3.42 MIL/uL — AB (ref 4.22–5.81)
RBC: 3.49 MIL/uL — ABNORMAL LOW (ref 4.22–5.81)
RDW: 16.4 % — ABNORMAL HIGH (ref 11.5–15.5)
RDW: 16.2 % — AB (ref 11.5–15.5)
WBC: 3.2 10*3/uL — ABNORMAL LOW (ref 4.0–10.5)
WBC: 3 10*3/uL — AB (ref 4.0–10.5)

## 2017-09-02 LAB — GLUCOSE, CAPILLARY
GLUCOSE-CAPILLARY: 79 mg/dL (ref 65–99)
GLUCOSE-CAPILLARY: 86 mg/dL (ref 65–99)
GLUCOSE-CAPILLARY: 90 mg/dL (ref 65–99)
GLUCOSE-CAPILLARY: 91 mg/dL (ref 65–99)
GLUCOSE-CAPILLARY: 95 mg/dL (ref 65–99)
Glucose-Capillary: 104 mg/dL — ABNORMAL HIGH (ref 65–99)

## 2017-09-02 LAB — POCT I-STAT 3, ART BLOOD GAS (G3+)
ACID-BASE DEFICIT: 6 mmol/L — AB (ref 0.0–2.0)
Bicarbonate: 18.9 mmol/L — ABNORMAL LOW (ref 20.0–28.0)
O2 SAT: 96 %
TCO2: 20 mmol/L — ABNORMAL LOW (ref 22–32)
pCO2 arterial: 32.4 mmHg (ref 32.0–48.0)
pH, Arterial: 7.371 (ref 7.350–7.450)
pO2, Arterial: 81 mmHg — ABNORMAL LOW (ref 83.0–108.0)

## 2017-09-02 LAB — CREATININE, SERUM
Creatinine, Ser: 0.84 mg/dL (ref 0.61–1.24)
GFR calc Af Amer: >60 mL/min (ref >60)
GFR calc non Af Amer: >60 mL/min (ref >60)

## 2017-09-02 LAB — MRSA PCR SCREENING: MRSA by PCR: NEGATIVE

## 2017-09-02 LAB — SALICYLATE LEVEL: Salicylate Lvl: <7 mg/dL (ref 2.8–30.0)

## 2017-09-02 LAB — VOLATILES,BLD-ACETONE,ETHANOL,ISOPROP,METHANOL
ACETONE, BLOOD: NEGATIVE % (ref 0.000–0.010)
ETHANOL, BLOOD: NEGATIVE % (ref 0.000–0.010)
Isopropanol, blood: NEGATIVE % (ref 0.000–0.010)
Methanol, blood: NEGATIVE % (ref 0.000–0.010)

## 2017-09-02 LAB — ETHYLENE GLYCOL: Ethylene Glycol Lvl: NOT DETECTED mg/dL

## 2017-09-02 LAB — MAGNESIUM
Magnesium: 1.7 mg/dL (ref 1.7–2.4)
Magnesium: 2.3 mg/dL (ref 1.7–2.4)

## 2017-09-02 LAB — PHOSPHORUS: Phosphorus: 3.8 mg/dL (ref 2.5–4.6)

## 2017-09-02 MED ORDER — ORAL CARE MOUTH RINSE
15.0000 mL | Freq: Two times a day (BID) | OROMUCOSAL | Status: DC
  Administered 2017-09-02: 15 mL via OROMUCOSAL

## 2017-09-02 MED ORDER — ACETAMINOPHEN 325 MG PO TABS
650.0000 mg | ORAL_TABLET | ORAL | Status: DC | PRN
  Administered 2017-09-02 – 2017-09-04 (×6): 650 mg via ORAL
  Filled 2017-09-02 (×6): qty 2

## 2017-09-02 MED ORDER — DEXTROSE-NACL 5-0.45 % IV SOLN
INTRAVENOUS | Status: DC
  Administered 2017-09-02 – 2017-09-03 (×2): via INTRAVENOUS

## 2017-09-02 MED ORDER — MAGNESIUM SULFATE 4 GM/100ML IV SOLN
4.0000 g | Freq: Once | INTRAVENOUS | Status: AC
  Administered 2017-09-02: 4 g via INTRAVENOUS
  Filled 2017-09-02: qty 100

## 2017-09-02 MED ORDER — POTASSIUM CHLORIDE 10 MEQ/100ML IV SOLN
10.0000 meq | INTRAVENOUS | Status: AC
  Administered 2017-09-02 (×6): 10 meq via INTRAVENOUS
  Filled 2017-09-02 (×7): qty 100

## 2017-09-02 NOTE — ED Notes (Signed)
Report called to rn on 2m 
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
## 2017-09-02 NOTE — Progress Notes (Signed)
PULMONARY / CRITICAL CARE MEDICINE   Name: Anthony [name] MRN: [id number] DOB: 1955/09/17    ADMISSION DATE:  09/01/2017 CONSULTATION DATE:  2/13  REFERRING MD:  Dr. Rogene Houston  CHIEF COMPLAINT:  Encephalopathy  HISTORY OF PRESENT ILLNESS:   62 year old male with PMH as below, which is significant for HTN, PE on xarelto, and alcohol abuse.   who was brought into the emergency department on 2/13 by his roommate with complaints of confusion. Apparently had been talking all throughout the night the night before which is uncommon and awoke from sleep in a confused state.  Upon arrival to the emergency department he remained confused but was pleasant.   he underwent a CT which was acutely negative, and was to have MRI but was unable to tolerate due to agitation despite benzodiazepine dosing.  Of note UDS negative and alcohol level 0 and a known drinker.  After receiving 6 mg of Ativan he remained confused and agitated requiring Precedex infusion for suspected alcohol withdrawal.  PCCM asked to admit ICU.  PAST MEDICAL HISTORY :  He  has a past medical history of Hydrocele, Hypertension, Lumbar compression fracture ([place]), and Skin cancer.  PAST SURGICAL HISTORY: He  has a past surgical history that includes Hydrocele excision / repair (2010); Hiatal hernia repair (2016); and Skin cancer excision ([place]).  No Known Allergies  No current facility-administered medications on file prior to encounter.    Current Outpatient Medications on File Prior to Encounter  Medication Sig  . acetaminophen (TYLENOL) 650 MG CR tablet TAKE 1 TABLET (650 MG TOTAL) BY MOUTH EVERY 8 (EIGHT) HOURS AS NEEDED FOR PAIN.  . cyanocobalamin ([employer]S VITAMIN B12) 1000 MCG tablet Take 1 tablet (1,000 mcg total) by mouth daily.  . folic acid (FOLVITE) 1 MG tablet Take 1 tablet (1 mg total) by mouth 2 (two) times a week.  . hydrochlorothiazide (HYDRODIURIL) 25 MG tablet Take 1 tablet (25 mg total) by mouth daily.  . Multiple  Vitamins-Minerals (MULTIVITAMIN WITH MINERALS) tablet Take 1 tablet by mouth daily.  Marland Kitchen thiamine (VITAMIN B-1) 100 MG tablet Take 1 tablet (100 mg total) by mouth 2 (two) times a week.  Alveda Reasons 20 MG TABS tablet TAKE 1 TABLET (20 MG TOTAL) BY MOUTH DAILY WITH SUPPER.  . chlordiazePOXIDE (LIBRIUM) 25 MG capsule 50mg  PO TID x 1D, then 25-50mg  PO BID X 1D, then 25-50mg  PO QD X 1D (Patient not taking: Reported on 09/01/2017)  . ferrous sulfate 325 (65 FE) MG tablet Take 325 mg by mouth 2 (two) times a week.  . hydroxypropyl methylcellulose / hypromellose (ISOPTO TEARS / GONIOVISC) 2.5 % ophthalmic solution Place 1 drop into both eyes as needed for dry eyes.  . methocarbamol (ROBAXIN) 750 MG tablet Take 1 tablet (750 mg total) by mouth every 8 (eight) hours as needed. (Patient not taking: Reported on 09/01/2017)  . Multiple Vitamin (MULTIVITAMIN WITH MINERALS) TABS tablet Take 1 tablet by mouth 2 (two) times a week.  . Multiple Vitamins-Minerals (PRESERVISION AREDS 2) CAPS Take 2 capsules by mouth 2 (two) times a week. (Patient not taking: Reported on 09/01/2017)    FAMILY HISTORY:  His indicated that his mother is deceased. He indicated that his father is deceased. He indicated that his sister is alive. He indicated that his brother is alive.   SOCIAL HISTORY: He  reports that  has never smoked. he has never used smokeless tobacco. He reports that he drinks about 25.2 oz of alcohol per week. He  reports that he uses drugs. Drug: Marijuana.  REVIEW OF SYSTEMS:   Unable to obtain  SUBJECTIVE:  Sleeping, difficult to arouse  VITAL SIGNS: BP (!) 88/66 Comment: dose of precedex down 50 %   Pulse 64   Temp (!) 97.5 F (36.4 C) (Oral) Comment: notified RN Rachel  Resp 15   Ht 5\' 10"  (1.778 m)   Wt 180 lb 16 oz (82.1 kg)   SpO2 99%   BMI 25.97 kg/m   HEMODYNAMICS:    VENTILATOR SETTINGS:  none  INTAKE / OUTPUT: I/O last 3 completed shifts: In: 1589 [I.V.:1389; IV Piggyback:200] Out: -    PHYSICAL EXAMINATION: General:  Elderly man laying in bed, sleeping Neuro:  Moves all extremities, opens eyes on command HEENT:  MMM Cardiovascular:  RRR, no MRG Lungs:  CTAB Abdomen:  Soft, NTND Musculoskeletal:  No edema or cyanosis Skin:  In tact  LABS:  BMET Recent Labs  Lab 09/01/17 0833 09/02/17 0111 09/02/17 0248  NA 131*  --  135  K 2.9*  --  2.8*  CL 98*  --  102  CO2 16*  --  18*  BUN 18  --  11  CREATININE 1.26* 0.84 0.74  GLUCOSE 86  --  88    Electrolytes Recent Labs  Lab 09/01/17 0833 09/02/17 0248  CALCIUM 9.6 8.9  MG  --  1.7  PHOS  --  3.8    CBC Recent Labs  Lab 09/01/17 0833 09/02/17 0111 09/02/17 0248  WBC 5.1 3.2* 3.0*  HGB 12.0* 11.5* 11.4*  HCT 35.0* 33.3* 33.7*  PLT 119* 107* 103*    Coag's No results for input(s): APTT, INR in the last 168 hours.  Sepsis Markers No results for input(s): LATICACIDVEN, PROCALCITON, O2SATVEN in the last 168 hours.  ABG Recent Labs  Lab 09/02/17 0434  PHART 7.371  PCO2ART 32.4  PO2ART 81.0*    Liver Enzymes Recent Labs  Lab 09/01/17 0833  AST 101*  ALT 66*  ALKPHOS 80  BILITOT 1.0  ALBUMIN 3.9    Cardiac Enzymes No results for input(s): TROPONINI, PROBNP in the last 168 hours.  Glucose Recent Labs  Lab 09/01/17 0845 09/02/17 0219  GLUCAP 89 79    Imaging Dg Chest 2 View  Result Date: 09/01/2017 CLINICAL DATA:  Shortness of breath with altered mental status EXAM: CHEST  2 VIEW COMPARISON:  Chest radiograph April 22, 2017 and chest CT April 22, 2017 FINDINGS: No edema or consolidation. Heart size and pulmonary vascularity are normal. No adenopathy. No evident bone lesions. IMPRESSION: No edema or consolidation. Electronically Signed   By: Lowella Grip III M.D.   On: 09/01/2017 13:31   Ct Head Wo Contrast  Result Date: 09/01/2017 CLINICAL DATA:  Confusion and hallucinations EXAM: CT HEAD WITHOUT CONTRAST TECHNIQUE: Contiguous axial images were obtained from the  base of the skull through the vertex without intravenous contrast. COMPARISON:  Head CT August 05, 2012. FINDINGS: Brain: There is mild diffuse atrophy with moderate cerebellar atrophy. There is no intracranial mass, hemorrhage, extra-axial fluid collection, or midline shift. Gray-white compartments appear normal. No evident acute infarct. Vascular: No hyperdense vessels. There is mild calcification in each carotid siphon region. Skull: The bony calvarium appears intact. Sinuses/Orbits: There is mucosal thickening in several ethmoid air cells bilaterally. There is rightward deviation of the nasal septum. Orbits appear symmetric bilaterally. Other: Mastoid air cells are clear. IMPRESSION: Mild supratentorial atrophy with moderate cerebellar atrophy. Gray-white compartments appear normal. No mass or hemorrhage.  There is mild arteriovascular calcification in the carotid siphon regions. There is rightward deviation of the nasal septum. There is mucosal thickening in several ethmoid air cells. Electronically Signed   By: Lowella Grip III M.D.   On: 09/01/2017 13:16    STUDIES:  CT head 2/13 > Mild supratentorial atrophy with moderate cerebellar atrophy. Gray-white compartments appear normal. No mass or hemorrhage. There is mild arteriovascular calcification in the carotid siphon regions. There is rightward deviation of the nasal septum. There is mucosal thickening in several ethmoid air cells.  CULTURES: none  ANTIBIOTICS: none  SIGNIFICANT EVENTS: 2/13 admitted to ICU  LINES/TUBES: PIV Foley 2/13  DISCUSSION: 62 year old male who is a known alcohol abuser with history of PE (09/2016) on xarelto. Admitted 2/13 with AMS presumed to be secondary to ETOH withdrawal as the rest of the acute workup was negative. His agitation persisted despite benzos and he was started on Precedex infusions, therefore was admitted to ICU.  ASSESSMENT / PLAN:  PULMONARY A: PE- 09/2016 P:   Hold  xarelto  CARDIOVASCULAR A:  HTN P:  Monitor on tele Hold home HCTZ for now  RENAL A:   AKI Hypokalemia Anion gap metabolic acidosis P:   Hydrate, monitor BMP Check beta hydroxybutyric acid (elevated 2.67), ASA (neg), Volatile panel (neg), lactic acid -ethylene glycol (pending)  GASTROINTESTINAL A:   none P:   NPO until mental status improves  HEMATOLOGIC A:   Leukopenia anemia P:  Follow CBC  INFECTIOUS A:   none P:   Monitor for fever, leukocytosis  ENDOCRINE A:   none   P:   Monitor CBG  NEUROLOGIC A:   Acute encephalopathy likely 2/2 ETOH withdrawal P:   RASS goal: 0, -1 Continue Precedex Monitor on CIWA No sitter needed at this time Supplement thiamine and folate  FAMILY  - Updates:  - Inter-disciplinary family meet or Palliative Care meeting due by:  2/20  Lucila Maine, DO PGY-2, [facility] 09/02/2017 8:26 AM

## 2017-09-02 NOTE — Progress Notes (Signed)
eLink Physician-Brief Progress Note Patient Name: Anthony [name] DOB: 04-02-1956 MRN: [id number]   Date of Service  09/02/2017  HPI/Events of Note  Hypokalemia, hypomag  eICU Interventions  Potassium and mag replaced     Intervention Category Intermediate Interventions: Electrolyte abnormality - evaluation and management  DETERDING,ELIZABETH 09/02/2017, 5:26 AM
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
## 2017-09-03 ENCOUNTER — Other Ambulatory Visit: Payer: Self-pay

## 2017-09-03 ENCOUNTER — Inpatient Hospital Stay (HOSPITAL_COMMUNITY): Payer: Medicaid Other

## 2017-09-03 DIAGNOSIS — I361 Nonrheumatic tricuspid (valve) insufficiency: Secondary | ICD-10-CM

## 2017-09-03 LAB — CBC
HCT: 33.9 % — ABNORMAL LOW (ref 39.0–52.0)
Hemoglobin: 11.3 g/dL — ABNORMAL LOW (ref 13.0–17.0)
MCH: 32.8 pg (ref 26.0–34.0)
MCHC: 33.3 g/dL (ref 30.0–36.0)
MCV: 98.3 fL (ref 78.0–100.0)
Platelets: 111 10*3/uL — ABNORMAL LOW (ref 150–400)
RBC: 3.45 MIL/uL — ABNORMAL LOW (ref 4.22–5.81)
RDW: 16.9 % — AB (ref 11.5–15.5)
WBC: 3.5 10*3/uL — AB (ref 4.0–10.5)

## 2017-09-03 LAB — ECHOCARDIOGRAM COMPLETE
Area-P 1/2: 5.79 cm2
CHL CUP DOP CALC LVOT VTI: 12 cm
E decel time: 129 msec
E/e' ratio: 5.55
FS: 26 % — AB (ref 28–44)
HEIGHTINCHES: 70 in
IV/PV OW: 1
LA vol: 67.4 mL
LADIAMINDEX: 1.85 cm/m2
LASIZE: 37 mm
LAVOLA4C: 49.1 mL
LAVOLIN: 33.7 mL/m2
LDCA: 4.91 cm2
LEFT ATRIUM END SYS DIAM: 37 mm
LV E/e'average: 5.55
LV TDI E'LATERAL: 10.3
LVEEMED: 5.55
LVELAT: 10.3 cm/s
LVOT SV: 59 mL
LVOT diameter: 25 mm
LVOT peak vel: 76 cm/s
Lateral S' vel: 13.7 cm/s
MV Dec: 129
MV pk A vel: 85.7 m/s
MV pk E vel: 57.2 m/s
P 1/2 time: 38 ms
PW: 11 mm — AB (ref 0.6–1.1)
TAPSE: 15.8 mm
TDI e' medial: 8.27
Weight: 2892.44 oz

## 2017-09-03 LAB — BASIC METABOLIC PANEL
Anion gap: 12 (ref 5–15)
BUN: 5 mg/dL — AB (ref 6–20)
CALCIUM: 8.8 mg/dL — AB (ref 8.9–10.3)
CO2: 20 mmol/L — ABNORMAL LOW (ref 22–32)
Chloride: 102 mmol/L (ref 101–111)
Creatinine, Ser: 0.7 mg/dL (ref 0.61–1.24)
GFR calc Af Amer: >60 mL/min (ref >60)
GLUCOSE: 93 mg/dL (ref 65–99)
Potassium: 3.2 mmol/L — ABNORMAL LOW (ref 3.5–5.1)
SODIUM: 134 mmol/L — AB (ref 135–145)

## 2017-09-03 LAB — GLUCOSE, CAPILLARY
GLUCOSE-CAPILLARY: 103 mg/dL — AB (ref 65–99)
Glucose-Capillary: 97 mg/dL (ref 65–99)

## 2017-09-03 LAB — MAGNESIUM: MAGNESIUM: 1.8 mg/dL (ref 1.7–2.4)

## 2017-09-03 MED ORDER — METHOCARBAMOL 500 MG PO TABS
500.0000 mg | ORAL_TABLET | Freq: Two times a day (BID) | ORAL | Status: DC | PRN
  Administered 2017-09-03 – 2017-09-04 (×3): 500 mg via ORAL
  Filled 2017-09-03 (×4): qty 1

## 2017-09-03 MED ORDER — VITAMIN B-1 100 MG PO TABS
100.0000 mg | ORAL_TABLET | Freq: Every day | ORAL | Status: DC
  Administered 2017-09-03 – 2017-09-05 (×3): 100 mg via ORAL
  Filled 2017-09-03 (×3): qty 1

## 2017-09-03 MED ORDER — TECHNETIUM TO 99M ALBUMIN AGGREGATED
4.2200 | Freq: Once | INTRAVENOUS | Status: AC | PRN
  Administered 2017-09-03: 4.22 via INTRAVENOUS

## 2017-09-03 MED ORDER — HYDROCHLOROTHIAZIDE 25 MG PO TABS
25.0000 mg | ORAL_TABLET | Freq: Every day | ORAL | Status: DC
  Administered 2017-09-03 – 2017-09-05 (×3): 25 mg via ORAL
  Filled 2017-09-03 (×3): qty 1

## 2017-09-03 MED ORDER — TECHNETIUM TC 99M DIETHYLENETRIAME-PENTAACETIC ACID
32.2000 | Freq: Once | INTRAVENOUS | Status: AC | PRN
  Administered 2017-09-03: 32.2 via RESPIRATORY_TRACT

## 2017-09-03 MED ORDER — POTASSIUM CHLORIDE CRYS ER 20 MEQ PO TBCR
40.0000 meq | EXTENDED_RELEASE_TABLET | Freq: Once | ORAL | Status: AC
  Administered 2017-09-03: 40 meq via ORAL
  Filled 2017-09-03: qty 2

## 2017-09-03 MED ORDER — FOLIC ACID 1 MG PO TABS
1.0000 mg | ORAL_TABLET | Freq: Every day | ORAL | Status: DC
  Administered 2017-09-03 – 2017-09-05 (×3): 1 mg via ORAL
  Filled 2017-09-03 (×3): qty 1
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
## 2017-09-03 NOTE — Progress Notes (Signed)
PULMONARY / CRITICAL CARE MEDICINE   Name: Anthony [name] MRN: [id number] DOB: 16-May-1956    ADMISSION DATE:  09/01/2017 CONSULTATION DATE:  2/13  REFERRING MD:  Dr. Rogene Houston  CHIEF COMPLAINT:  Encephalopathy  HISTORY OF PRESENT ILLNESS:   62 year old male with PMH as below, which is significant for HTN, PE on xarelto, and alcohol abuse who was brought into the emergency department on 2/13 by his roommate with complaints of confusion. Apparently had been talking all throughout the night the night before which is uncommon and awoke from sleep in a confused state.  Upon arrival to the emergency department he remained confused but was pleasant.  He underwent a CT which was acutely negative, and was to have MRI but was unable to tolerate due to agitation despite benzodiazepine dosing.  Of note UDS negative and alcohol level 0, patient is a known drinker.  After receiving 6 mg of Ativan he remained confused and agitated requiring Precedex infusion for suspected alcohol withdrawal.  PCCM asked to admit ICU.  PAST MEDICAL HISTORY :  He  has a past medical history of Hydrocele, Hypertension, Lumbar compression fracture ([place]), and Skin cancer.  PAST SURGICAL HISTORY: He  has a past surgical history that includes Hydrocele excision / repair (2010); Hiatal hernia repair (2016); and Skin cancer excision (Left).  No Known Allergies  No current facility-administered medications on file prior to encounter.    Current Outpatient Medications on File Prior to Encounter  Medication Sig  . acetaminophen (TYLENOL) 650 MG CR tablet TAKE 1 TABLET (650 MG TOTAL) BY MOUTH EVERY 8 (EIGHT) HOURS AS NEEDED FOR PAIN.  . cyanocobalamin ([employer] VITAMIN B12) 1000 MCG tablet Take 1 tablet (1,000 mcg total) by mouth daily.  . folic acid (FOLVITE) 1 MG tablet Take 1 tablet (1 mg total) by mouth 2 (two) times a week.  . hydrochlorothiazide (HYDRODIURIL) 25 MG tablet Take 1 tablet (25 mg total) by mouth daily.  . Multiple  Vitamins-Minerals (MULTIVITAMIN WITH MINERALS) tablet Take 1 tablet by mouth daily.  Marland Kitchen thiamine (VITAMIN B-1) 100 MG tablet Take 1 tablet (100 mg total) by mouth 2 (two) times a week.  Alveda Reasons 20 MG TABS tablet TAKE 1 TABLET (20 MG TOTAL) BY MOUTH DAILY WITH SUPPER.  . chlordiazePOXIDE (LIBRIUM) 25 MG capsule 50mg  PO TID x 1D, then 25-50mg  PO BID X 1D, then 25-50mg  PO QD X 1D (Patient not taking: Reported on 09/01/2017)  . ferrous sulfate 325 (65 FE) MG tablet Take 325 mg by mouth 2 (two) times a week.  . hydroxypropyl methylcellulose / hypromellose (ISOPTO TEARS / GONIOVISC) 2.5 % ophthalmic solution Place 1 drop into both eyes as needed for dry eyes.  . methocarbamol (ROBAXIN) 750 MG tablet Take 1 tablet (750 mg total) by mouth every 8 (eight) hours as needed. (Patient not taking: Reported on 09/01/2017)  . Multiple Vitamin (MULTIVITAMIN WITH MINERALS) TABS tablet Take 1 tablet by mouth 2 (two) times a week.  . Multiple Vitamins-Minerals (PRESERVISION AREDS 2) CAPS Take 2 capsules by mouth 2 (two) times a week. (Patient not taking: Reported on 09/01/2017)    FAMILY HISTORY:  His indicated that his mother is deceased. He indicated that his father is deceased. He indicated that his sister is alive. He indicated that his brother is alive.   SOCIAL HISTORY: He  reports that  has never smoked. he has never used smokeless tobacco. He reports that he drinks about 25.2 oz of alcohol per week. He reports that  he uses drugs. Drug: Marijuana.  REVIEW OF SYSTEMS:   Endorses DOE. Denies CP.   SUBJECTIVE:  Awake, interactive, no complaints. Asking for regular food and his PO medications. He reports he has been drinking 3 beers a day (12 oz cans). He reports for past year since having dx of PE he has been extremely short of breath with minimal exertion. He says the SOB can come on suddenly. He can barely shower due to SOB.  VITAL SIGNS: BP (!) 135/91   Pulse (!) 106   Temp 98.7 F (37.1 C) (Oral)    Resp (!) 26   Ht 5\' 10"  (1.778 m)   Wt 180 lb 12.4 oz (82 kg)   SpO2 98%   BMI 25.94 kg/m   HEMODYNAMICS:    VENTILATOR SETTINGS:  none  INTAKE / OUTPUT: I/O last 3 completed shifts: In: 3918.6 [P.O.:1320; I.V.:2298.6; IV Piggyback:300] Out: 3120 [Urine:3120]  PHYSICAL EXAMINATION: General:  Elderly man laying in bed, in NAD Neuro:  No focal deficits HEENT:  MMM Cardiovascular:  RRR, no MRG Lungs:  CTAB, normal work of breathing Abdomen:  Soft, NTND, +BS Musculoskeletal:  No edema or cyanosis, SCDs in place. No calf swelling. Negative homan sign.  Skin:  In tact  LABS:  BMET Recent Labs  Lab 09/02/17 0248 09/02/17 1236 09/03/17 0324  NA 135 135 134*  K 2.8* 3.7 3.2*  CL 102 106 102  CO2 18* 18* 20*  BUN 11 9 5*  CREATININE 0.74 0.74 0.70  GLUCOSE 88 100* 93    Electrolytes Recent Labs  Lab 09/02/17 0248 09/02/17 1236 09/03/17 0324  CALCIUM 8.9 8.4* 8.8*  MG 1.7 2.3 1.8  PHOS 3.8  --   --     CBC Recent Labs  Lab 09/02/17 0111 09/02/17 0248 09/03/17 0324  WBC 3.2* 3.0* 3.5*  HGB 11.5* 11.4* 11.3*  HCT 33.3* 33.7* 33.9*  PLT 107* 103* 111*    Coag's No results for input(s): APTT, INR in the last 168 hours.  Sepsis Markers No results for input(s): LATICACIDVEN, PROCALCITON, O2SATVEN in the last 168 hours.  ABG Recent Labs  Lab 09/02/17 0434  PHART 7.371  PCO2ART 32.4  PO2ART 81.0*    Liver Enzymes Recent Labs  Lab 09/01/17 0833  AST 101*  ALT 66*  ALKPHOS 80  BILITOT 1.0  ALBUMIN 3.9    Cardiac Enzymes No results for input(s): TROPONINI, PROBNP in the last 168 hours.  Glucose Recent Labs  Lab 09/02/17 1134 09/02/17 1511 09/02/17 2058 09/02/17 2345 09/03/17 0415 09/03/17 0802  GLUCAP 95 104* 90 86 103* 97    Imaging No results found.  STUDIES:  CT head 2/13 > Mild supratentorial atrophy with moderate cerebellar atrophy. Gray-white compartments appear normal. No mass or hemorrhage. There is mild  arteriovascular calcification in the carotid siphon regions. There is rightward deviation of the nasal septum. There is mucosal thickening in several ethmoid air cells.  CULTURES: none  ANTIBIOTICS: none  SIGNIFICANT EVENTS: 2/13 admitted to ICU  LINES/TUBES: PIV Foley 2/13  DISCUSSION: 62 year old male who is a known alcohol abuser with history of PE (09/2016) on xarelto. Admitted 2/13 with AMS presumed to be secondary to ETOH withdrawal as the rest of the acute workup was negative. His agitation persisted despite benzos and he was started on Precedex infusions, therefore was admitted to ICU.  ASSESSMENT / PLAN:  PULMONARY A: Hx of submassive PE- dx 10/2016 has been on xarelto since, no PE seen on CTA done  04/2017 Chronic dyspnea P:   Hold xarelto, unclear if patient needs to be on this past 1 year tx w/ anticoagulation Consider repeat chest imaging (V/Q scan if concern for chronic thromboembolism), echo, walking pulse ox, PFT's to evaluate complaints of chronic intermittent dyspnea  CARDIOVASCULAR A:  HTN P:  Monitor on tele Resume home HCTZ  RENAL A:   AKI Hypokalemia Anion gap metabolic acidosis P:   Hydrate, monitor BMP Replete electrolytes as needed  Check beta hydroxybutyric acid (elevated 2.67), ASA (neg), Volatile panel (neg), ethylene glycol (negative)  GASTROINTESTINAL A:   none P:   Regular diet  HEMATOLOGIC A:   Leukopenia anemia P:  Follow CBC  INFECTIOUS A:   none P:   Monitor for fever, leukocytosis  ENDOCRINE A:   none   P:   Monitor CBG  NEUROLOGIC A:   Acute encephalopathy likely 2/2 ETOH withdrawal- resolved P:   Monitor on CIWA Supplement thiamine and folate Transfer out to primary team   FAMILY  - Updates: no family at bedside - Inter-disciplinary family meet or Palliative Care meeting due by:  2/20  Lucila Maine, DO PGY-2, [facility] 09/03/2017 8:42 AM

## 2017-09-03 NOTE — Plan of Care (Signed)
  Completed/Met Education: Knowledge of General Education information will improve 09/03/2017 0555 - Completed/Met by Milford Cage, RN Clinical Measurements: Will remain free from infection 09/03/2017 0555 - Completed/Met by Milford Cage, RN Respiratory complications will improve 09/03/2017 0555 - Completed/Met by Milford Cage, RN Cardiovascular complication will be avoided 09/03/2017 0555 - Completed/Met by Genice Kimberlin A, RN Coping: Level of anxiety will decrease 09/03/2017 0555 - Completed/Met by Maclane Holloran A, RN Elimination: Will not experience complications related to urinary retention 09/03/2017 0555 - Completed/Met by Pieter Partridge A, RN Safety: Ability to remain free from injury will improve 09/03/2017 0555 - Completed/Met by Teghan Philbin A, RN Skin Integrity: Risk for impaired skin integrity will decrease 09/03/2017 0555 - Completed/Met by Milford Cage, RN   Progressing Clinical Measurements: Diagnostic test results will improve 09/03/2017 0555 - Progressing by Quest Tavenner A, RN          Pt neurological status improved. NO further instances of confusion/           Agitation.  Activity: Risk for activity intolerance will decrease 09/03/2017 0555 - Progressing by Milford Cage, RN Nutrition: Adequate nutrition will be maintained 09/03/2017 0555 - Progressing by Milford Cage, RN Patient tolerating a full liquid diet well, ready to have a normal diet again.  Elimination: Will not experience complications related to bowel motility 09/03/2017 0555 - Progressing by Milford Cage, RN

## 2017-09-03 NOTE — Progress Notes (Signed)
Pt to transfer from VQ scan to 5M09 Radiology aware. Pt was placed on tele monitor - all belongings taken to pt's room by RN. Pt has glasses on his face.

## 2017-09-03 NOTE — Progress Notes (Signed)
Called report to [facility] spoke to [employer]

## 2017-09-03 NOTE — Progress Notes (Signed)
  Echocardiogram 2D Echocardiogram has been performed.  Anthony [name] 09/03/2017, 5:11 PM

## 2017-09-04 DIAGNOSIS — D61818 Other pancytopenia: Secondary | ICD-10-CM

## 2017-09-04 DIAGNOSIS — F10239 Alcohol dependence with withdrawal, unspecified: Secondary | ICD-10-CM

## 2017-09-04 DIAGNOSIS — I10 Essential (primary) hypertension: Secondary | ICD-10-CM

## 2017-09-04 DIAGNOSIS — R0602 Shortness of breath: Secondary | ICD-10-CM

## 2017-09-04 LAB — BASIC METABOLIC PANEL
ANION GAP: 13 (ref 5–15)
BUN: <5 mg/dL — ABNORMAL LOW (ref 6–20)
CO2: 21 mmol/L — ABNORMAL LOW (ref 22–32)
Calcium: 9.2 mg/dL (ref 8.9–10.3)
Chloride: 102 mmol/L (ref 101–111)
Creatinine, Ser: 0.67 mg/dL (ref 0.61–1.24)
GLUCOSE: 102 mg/dL — AB (ref 65–99)
POTASSIUM: 3.3 mmol/L — AB (ref 3.5–5.1)
Sodium: 136 mmol/L (ref 135–145)

## 2017-09-04 LAB — CBC
HEMATOCRIT: 36.8 % — AB (ref 39.0–52.0)
HEMOGLOBIN: 12.1 g/dL — AB (ref 13.0–17.0)
MCH: 32.4 pg (ref 26.0–34.0)
MCHC: 32.9 g/dL (ref 30.0–36.0)
MCV: 98.7 fL (ref 78.0–100.0)
Platelets: 142 10*3/uL — ABNORMAL LOW (ref 150–400)
RBC: 3.73 MIL/uL — ABNORMAL LOW (ref 4.22–5.81)
RDW: 16.9 % — ABNORMAL HIGH (ref 11.5–15.5)
WBC: 3.2 10*3/uL — ABNORMAL LOW (ref 4.0–10.5)

## 2017-09-04 LAB — MAGNESIUM: MAGNESIUM: 1.5 mg/dL — AB (ref 1.7–2.4)

## 2017-09-04 MED ORDER — LORAZEPAM 1 MG PO TABS
1.0000 mg | ORAL_TABLET | Freq: Four times a day (QID) | ORAL | Status: DC | PRN
  Administered 2017-09-04: 1 mg via ORAL
  Filled 2017-09-04: qty 1

## 2017-09-04 MED ORDER — LORAZEPAM 2 MG/ML IJ SOLN
1.0000 mg | Freq: Four times a day (QID) | INTRAMUSCULAR | Status: DC | PRN
Start: 2017-09-04 — End: 2017-09-05

## 2017-09-04 MED ORDER — RIVAROXABAN 20 MG PO TABS
20.0000 mg | ORAL_TABLET | Freq: Every day | ORAL | Status: DC
  Administered 2017-09-04: 20 mg via ORAL
  Filled 2017-09-04 (×2): qty 1

## 2017-09-04 MED ORDER — POTASSIUM CHLORIDE CRYS ER 20 MEQ PO TBCR
40.0000 meq | EXTENDED_RELEASE_TABLET | Freq: Once | ORAL | Status: AC
  Administered 2017-09-04: 40 meq via ORAL
  Filled 2017-09-04: qty 2

## 2017-09-04 MED ORDER — ADULT MULTIVITAMIN W/MINERALS CH
1.0000 | ORAL_TABLET | Freq: Every day | ORAL | Status: DC
  Administered 2017-09-04 – 2017-09-05 (×2): 1 via ORAL
  Filled 2017-09-04 (×2): qty 1

## 2017-09-04 MED ORDER — CETAPHIL MOISTURIZING EX LOTN
TOPICAL_LOTION | Freq: Every day | CUTANEOUS | Status: DC
  Administered 2017-09-05: 09:00:00 via TOPICAL
  Filled 2017-09-04: qty 473

## 2017-09-04 MED ORDER — MAGNESIUM SULFATE 2 GM/50ML IV SOLN
2.0000 g | Freq: Once | INTRAVENOUS | Status: AC
  Administered 2017-09-04: 2 g via INTRAVENOUS
  Filled 2017-09-04: qty 50

## 2017-09-04 MED ORDER — METHOCARBAMOL 500 MG PO TABS
500.0000 mg | ORAL_TABLET | Freq: Three times a day (TID) | ORAL | Status: DC | PRN
  Administered 2017-09-04 – 2017-09-05 (×2): 500 mg via ORAL
  Filled 2017-09-04 (×2): qty 1

## 2017-09-04 NOTE — Progress Notes (Signed)
FMTS Attending Daily Note:  S: Mr. Nevins has transferred to our service from ICU.  He feels well this morning though is having some lower back pain.  He is frustrated that he cannot get out of bed.  He has been having ongoing dyspnea for the past year which he reports has been since his pulmonary embolism in April 2018.  Otherwise no complaints this morning.  Exam: BP (!) 141/97 (BP Location: Left Arm)   Pulse 99   Temp 97.8 F (36.6 C) (Oral)   Resp 20   Ht 5\' 10"  (1.778 m)   Wt 181 lb 7 oz (82.3 kg)   SpO2 97%   BMI 26.03 kg/m  Gen:  Alert, cooperative patient who appears stated age in no acute distress.  Vital signs reviewed. HEENT: Pupils equal and reactive to light.  Mucous members are moist Lungs: Clear throughout Heart: Regular rate and rhythm Abdomen is nontender Neuro: No tremulousness on exam.  Confluent speech.  No hallucinations.  Impression/plan: 1.  Acute alcohol withdrawal: - presumed as rest of w/u negative.   -I know Mr. Record very well as I used to be his outpatient primary care physician. -He is a long standing alcoholic and is in denial about the extent of his disease. -When asked what brought him to the hospital this time he says it was secondary to trouble breathing.  He denies having any trouble with his alcoholism at all. -He was in the ICU on Precedex due to w/d symptoms but has been off for several days..  We are watching him closely and monitor his CIWA scores. -Electrolytes are low.  Need to replete his mag and potassium.  2.  Dyspnea: -Unclear how much of this is psychogenic/secondary to anxiety. -Need to ambulate to see if he does indeed become hypoxic while walking. -He is being appropriately treated with Xarelto for PE. -He has had a negative VQ scan here in the hospital yesterday as well as negative chest x-ray.  3.  Pancytopenia: -Hemoglobin is only is leukopenic and thrombocytopenic most likely secondary to ongoing alcoholism. - Needs HIV  with AM labs tomorrow. Hep C screening is negative.   Alveda Reasons, MD 09/04/2017 12:02 PM

## 2017-09-04 NOTE — Progress Notes (Signed)
SATURATION QUALIFICATIONS: (This note is used to comply with regulatory documentation for home oxygen)  Patient Saturations on Room Air at Rest = 99%  Patient Saturations on Room Air while Ambulating = 98-100%  Bartholomew Crews, RN

## 2017-09-04 NOTE — Progress Notes (Signed)
ANTICOAGULATION CONSULT NOTE - Initial Consult  Pharmacy Consult for Xarelto Indication: pulmonary embolus   No Known Allergies  Patient Measurements: Height: 5\' 10"  (177.8 cm) Weight: 181 lb 7 oz (82.3 kg) IBW/kg (Calculated) : 73  Vital Signs: Temp: 98.5 F (36.9 C) (02/Anthony 0531) Temp Source: Oral (02/Anthony 0531) BP: 156/97 (02/Anthony 0531) Pulse Rate: 116 (02/Anthony 0531)  Labs: Recent Labs    09/02/17 0248 09/02/17 1236 09/03/17 0324 02/Anthony/19 0538  HGB 11.4*  --  11.3* 12.1*  HCT 33.7*  --  33.9* 36.8*  PLT 103*  --  111* 142*  CREATININE 0.74 0.74 0.70 0.67   Estimated Creatinine Clearance: 100.1 mL/min (by C-G formula based on SCr of 0.67 mg/dL).  Medical History: Past Medical History:  Diagnosis Date  . Hydrocele    Surgically corrected  . Hypertension   . Lumbar compression fracture ([place])   . Skin cancer    exciced 2017   Assessment: Anthony [name] presenting with AMS, on Xarelto PTA for hx PE (April 2018). Xarelto initially held by admitting team (assumed tx completed, has been on ~8 months), but pharmacy now consulted to resume as patient is to remain on Xarelto for 1 year total. Hgb 12.1, pltc 142. No bleeding noted. Scr 0.67 stable, est CrCl ~100 mL/min.  Goal of Therapy:  Monitor platelets by anticoagulation protocol: Yes   Plan:  Resume Xarelto 20mg  PO daily Monitor renal function, s/sx of bleeding Pharmacy will sign off as dose adjustments are not anticipated  Erin N. Gerarda Fraction, PharmD PGY1 Pharmacy Resident Pager: [phone] 2/Anthony/2019,10:20 AM

## 2017-09-04 NOTE — Progress Notes (Signed)
Family Medicine Teaching Service Daily Progress Note Intern Pager: [phone]  Patient name: Anthony [name] Medical record number: [id number] Date of birth: January 13, 1956 Age: 62 y.o. Gender: male  Primary Care Provider: Mercy Riding, MD Consultants: CCM (signed off) Code Status: Full  Pt Overview and Major Events to Date:  2/13 admitted to ICU for acute encephalopathy and required precedex gtt 2/16 transferred to FPTS   Assessment and Plan: Anthony [name] is a 62 y.o. male who presented in acute encephalopathy and was admitted to ICU for precedex gtt. PMH is significant for alcohol abuse, h/o PE in April 2018 on xarelto, HTN, chronic back pain.  EtOH abuse / resolved acute encephalopathy. Presumed likely 2/2 EtOH withdrawal given h/o DTs requiring precedex in the past. Per informal CCM sign out, some concern of robaxin withdrawal that may have contributed. Patient is a unreliable historian who states he drinks 2 12oz beers a day with his last drink the day of admission but EtOH level undetectable on admission. He also states that he has been taking robaxin TID without any missed doses.  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 Stable. Patient endorses dyspnea on exertion since PE in April 2018. Has had intermittent charted tachypnea up to RR 26 during hospital stay with normal RR of 14-16. This morning RR 19 with unremarkable lung exam. Neg V/Q scan for PE and CXR showing clear lungs. Echo EF65-65%, G1DD. No h/o of chronic lung disease and states his only tobacco hx is occasional smoking in teenage years. Denies h/o asthma even in childhood. Suspect some component of anxiety. -  Restart xarelto, patient will need for at least 1 year from 11/07/16. Given he was a poor historian during that admission, unclear if PE was provoked or  unprovoked event. - check amb pulse ox today - will need outpatient PFTs  Hypokalemia. K 3.3 - Kdur 40 mEq x1 - monitor BMP  Hypomagnesia. Mag 1.5 - Mag 2g x1  - monitor Mag  HTN. Chronic, stable. - Continue home HCTZ 25mg  qd  Pancytopenia likely 2/2 EtOH abuse. Hgb 12.1 at BL of ~12, Plt 142 improved from BL ~110. WBC 3.2 from BL ~5-4.  - monitor CBC  Chronic back pain, stable. - PT/IOT consulted - continue robaxin  Itching 2/2 dry skin and dandruff.  Patient requesting benadryl but recent acute encephalopathy so will hold off.  - lotion and encouraged good moisturizer use at home - treat with dandruff shampoo as outpatient   FEN/GI: regular diet PPx: xarelto  Disposition: pending PT/OT recs  Subjective:  Patient states that he feels overall well this morning. States his breathing is doing well at rest but that he think if he got up to walk that he would feel short of breath which has been ongoing since his PE last April.  He denies any history of excessive alcohol use, states drinks 2 12oz beers a day with last drink being on day he was brought to the hospital. Is not interested in quitting. He states his chronic back pain has been worse than usual prompting him to increase his robaxin from twice a day to 3 times a day recently. His main concern is that he feels itchy from dry skin this morning.    Objective: Temp:  [97.6 F (36.4 C)-98.6 F (37 C)] 98.5 F (36.9  C) (02/16 0531) Pulse Rate:  [95-128] 116 (02/16 0531) Resp:  [18-24] 19 (02/16 0531) BP: (126-156)/(91-100) 156/97 (02/16 0531) SpO2:  [97 %-99 %] 99 % (02/16 0531) Weight:  [181 lb 7 oz (82.3 kg)] 181 lb 7 oz (82.3 kg) (02/16 0439) Physical Exam: General: sitting up in bed, in NAD Cardiovascular: RRR, no murmurs Respiratory: normal work of breathing on room air, clear with good air movement bilaterally. Abdomen: soft, nontender, nondistended, + bowel sounds Extremities: moving all limbs equally. Warm  and well perfused Neuro: alert and awake, grossly normal  Laboratory: Recent Labs  Lab 09/02/17 0248 09/03/17 0324 09/04/17 0538  WBC 3.0* 3.5* 3.2*  HGB 11.4* 11.3* 12.1*  HCT 33.7* 33.9* 36.8*  PLT 103* 111* 142*   Recent Labs  Lab 09/01/17 0833  09/02/17 1236 09/03/17 0324 09/04/17 0538  NA 131*   < > 135 134* 136  K 2.9*   < > 3.7 3.2* 3.3*  CL 98*   < > 106 102 102  CO2 16*   < > 18* 20* 21*  BUN 18   < > 9 5* <5*  CREATININE 1.26*   < > 0.74 0.70 0.67  CALCIUM 9.6   < > 8.4* 8.8* 9.2  PROT 7.5  --   --   --   --   BILITOT 1.0  --   --   --   --   ALKPHOS 80  --   --   --   --   ALT 66*  --   --   --   --   AST 101*  --   --   --   --   GLUCOSE 86   < > 100* 93 102*   < > = values in this interval not displayed.   Ethanol 09/01/17: < 10 Acetaminophen 09/01/17: < 10 Ethylene glycol 09/02/17: none detected   Imaging/Diagnostic Tests: Dg Chest 2 View  Result Date: 09/03/2017 CLINICAL DATA:  Short of breath EXAM: CHEST  2 VIEW COMPARISON:  09/01/2017 FINDINGS: The heart size and mediastinal contours are within normal limits. Both lungs are clear. The visualized skeletal structures are unremarkable. IMPRESSION: No active cardiopulmonary disease. Electronically Signed   By: Franchot Gallo M.D.   On: 09/03/2017 13:44   Nm Pulmonary Perf And Vent  Result Date: 09/03/2017 CLINICAL DATA:  History of pulmonary embolus. EXAM: NUCLEAR MEDICINE VENTILATION - PERFUSION LUNG SCAN TECHNIQUE: Ventilation images were obtained in multiple projections using inhaled aerosol Tc-2m DTPA. Perfusion images were obtained in multiple projections after intravenous injection of Tc-3m-MAA. RADIOPHARMACEUTICALS:  32.2 mCi of Tc-61m DTPA aerosol inhalation and 4.2 mCi Tc59m-MAA IV COMPARISON:  CT 04/22/2017. FINDINGS: No ventilation perfusion mismatches. No evidence of pulmonary embolus. IMPRESSION: No evidence of pulmonary embolus. Electronically Signed   By: Marcello Moores  Register   On: 09/03/2017  13:49   Transthoracic Echocardiography  Study Date: 09/03/2017 Conclusions - Left ventricle: The cavity size was normal. Wall thickness was   normal. Systolic function was normal. The estimated ejection   fraction was in the range of 60% to 65%. Doppler parameters are   consistent with abnormal left ventricular relaxation (grade 1   diastolic dysfunction). - Atrial septum: No defect or patent foramen ovale was identified.  Bufford Lope, DO 09/04/2017, 8:38 AM PGY-2, [facility] Intern pager: [phone], text pages welcome

## 2017-09-05 LAB — CBC
HCT: 40.5 % (ref 39.0–52.0)
Hemoglobin: 13.4 g/dL (ref 13.0–17.0)
MCH: 33.1 pg (ref 26.0–34.0)
MCHC: 33.1 g/dL (ref 30.0–36.0)
MCV: 100 fL (ref 78.0–100.0)
PLATELETS: 177 10*3/uL (ref 150–400)
RBC: 4.05 MIL/uL — AB (ref 4.22–5.81)
RDW: 17 % — AB (ref 11.5–15.5)
WBC: 5 10*3/uL (ref 4.0–10.5)

## 2017-09-05 LAB — BASIC METABOLIC PANEL
Anion gap: 12 (ref 5–15)
BUN: 11 mg/dL (ref 6–20)
CALCIUM: 10.2 mg/dL (ref 8.9–10.3)
CO2: 22 mmol/L (ref 22–32)
CREATININE: 1.03 mg/dL (ref 0.61–1.24)
Chloride: 103 mmol/L (ref 101–111)
Glucose, Bld: 77 mg/dL (ref 65–99)
Potassium: 4.4 mmol/L (ref 3.5–5.1)
SODIUM: 137 mmol/L (ref 135–145)

## 2017-09-05 LAB — HIV ANTIBODY (ROUTINE TESTING W REFLEX): HIV SCREEN 4TH GENERATION: NONREACTIVE

## 2017-09-05 LAB — MAGNESIUM: MAGNESIUM: 1.9 mg/dL (ref 1.7–2.4)

## 2017-09-05 MED ORDER — CETAPHIL MOISTURIZING EX LOTN: TOPICAL_LOTION | Freq: Every day | CUTANEOUS | 0 refills | Status: DC

## 2017-09-05 NOTE — Progress Notes (Signed)
PT Cancellation Note  Patient Details Name: Anthony [name] MRN: [id number] DOB: 07/16/1956   Cancelled Treatment:    Reason Eval/Treat Not Completed: Other (comment)(Orders were cancelled prior to PT arrival.  )   Denice Paradise 09/05/2017, 12:15 PM  [name],PT Acute Rehabilitation [phone] (pager)

## 2017-09-05 NOTE — Discharge Instructions (Signed)
It has been a pleasure taking care of you! You were admitted due to alcohol withdrawal. We have treated you with medications. With that your symptoms improved to the point we think it is safe to let you go home and follow up with your primary care doctor.  There could be some changes made to your home medications during this hospitalization. Please, make sure to read the directions before you take them. The names and directions on how to take these medications are found on this discharge paper under medication section.  Please follow up at your primary care doctor's office. The address, date and time are found on the discharge paper under follow up section.      Alcohol Withdrawal Alcohol withdrawal is a group of symptoms that can develop when a person who drinks heavily and regularly stops drinking or drinks less. What are the causes? Heavy and regular drinking can cause chemicals that send signals from the brain to the body (neurotransmitters) to deactivate. Alcohol withdrawal develops when deactivated neurotransmitters reactivate because a person stops drinking or drinks less. What increases the risk? The more a person drinks and the longer he or she drinks, the greater the risk of alcohol withdrawal. Severe withdrawal is more likely to develop in someone who:  Had severe alcohol withdrawal in the past.  Had a seizure during a previous episode of alcohol withdrawal.  Is elderly.  Is pregnant.  Has been abusing drugs.  Has other medical problems, including: ? Infection. ? Heart, lung, or liver disease. ? Seizures. ? Mental health problems.  What are the signs or symptoms? Symptoms of this condition can be mild to moderate, or they can be severe. Mild to moderate symptoms may include:  Fatigue.  Nightmares.  Trouble sleeping.  Depression.  Anxiety.  Inability to think clearly.  Mood swings.  Irritability.  Loss of appetite.  Nausea or vomiting.  Clammy  skin.  Extreme sweating.  Rapid heartbeat.  Shakiness.  Uncontrollable shaking (tremor).  Severe symptoms may include:  Fever.  Seizures.  Severeconfusion.  Feeling or seeing things that are not there (hallucinations).  Symptoms usually begin within eight hours after a person stops drinking or drinks less. They can last for weeks. How is this diagnosed? Alcohol withdrawal is diagnosed with a medical history and physical exam. Sometimes, urine and blood tests are also done. How is this treated? Treatment may involve:  Monitoring blood pressure, pulse, and breathing.  Getting fluids through an IV tube.  Medicine to reduce anxiety.  Medicine to prevent or control seizures.  Multivitamins and B vitamins.  Having a health care provider check on you daily.  If symptoms are moderate to severe or if there is a risk of severe withdrawal, treatment may be done at a hospital or treatment center. Follow these instructions at home:  Take medicines and vitamin supplements only as directed by your health care provider.  Do not drink alcohol.  Have someone stay with you or be available if you need help.  Drink enough fluid to keep your urine clear or pale yellow.  Consider joining a 12-step program or another alcohol support group. Contact a health care provider if:  Your symptoms get worse or do not go away.  You cannot keep food or water in your stomach.  You are struggling with not drinking alcohol.  You cannot stop drinking alcohol. Get help right away if:  You have an irregular heartbeat.  You have chest pain.  You have trouble breathing.  You have symptoms of severe withdrawal, such as: ? A fever. ? Seizures. ? Severe confusion. ? Hallucinations. This information is not intended to replace advice given to you by your health care provider. Make sure you discuss any questions you have with your health care provider. Document Released: 04/15/2005 Document  Revised: 11/13/2015 Document Reviewed: 04/24/2014 Elsevier Interactive Patient Education  [employer].

## 2017-09-05 NOTE — Progress Notes (Signed)
Family Medicine Teaching Service Daily Progress Note Intern Pager: [phone]  Patient name: Anthony [name] Medical record number: [id number] Date of birth: 01-11-1956 Age: 62 y.o. Gender: male  Primary Care Provider: Mercy Riding, MD Consultants: CCM (signed off) Code Status: Full  Pt Overview and Major Events to Date:  2/13 admitted to ICU for acute encephalopathy and required precedex gtt 2/16 transferred to FPTS   Assessment and Plan: Anthony [name] is a 62 y.o. male who presented in acute encephalopathy and was admitted to ICU for precedex gtt. PMH is significant for alcohol abuse, h/o PE in April 2018 on xarelto, HTN, chronic back pain.  EtOH abuse / resolved acute encephalopathy. Presumed likely 2/2 EtOH withdrawal given h/o DTs requiring precedex in the past.  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 Stable. Patient endorses dyspnea on exertion since PE in April 2018. RR this morning 16, amb pulse ox 98-100%, patient has no chronic lung disease. Suspect some component of anxiety. -Continue xarelto, patient will need for at least 1 year from 11/07/16. Given he was a poor historian during that admission, unclear if PE was provoked or unprovoked event. - will need outpatient PFTs  Hypokalemia, resolved. K 4.4 - monitor BMP  Hypomagnesia, resolved. Mag 1.9 - monitor Mag  HTN. Chronic, stable. - Continue home HCTZ 25mg  qd  Pancytopenia, resolved likely 2/2 EtOH abuse. - monitor CBC  Chronic back pain, stable. - PT/OT consulted - continue robaxin  Itching 2/2 dry skin and dandruff.  - lotion and encouraged good moisturizer use at home - treat with dandruff shampoo as outpatient   FEN/GI: regular diet PPx: xarelto  Disposition: DC home today   Subjective:  Feels well today, walked without dyspnea  yesterday. Agreeable to going home and follow up as outpatient, wants PFTs   Objective: Temp:  [97.6 F (36.4 C)-98 F (36.7 C)] 98 F (36.7 C) (02/17 0505) Pulse Rate:  [95-125] 100 (02/17 0505) Resp:  [16-20] 16 (02/17 0505) BP: (112-141)/(74-97) 112/74 (02/17 0505) SpO2:  [97 %-99 %] 97 % (02/17 0505) Weight:  [80.6 kg (177 lb 11.1 oz)-80.6 kg (177 lb 12.8 oz)] 80.6 kg (177 lb 11.1 oz) (02/17 0110) Physical Exam: General: sitting up in bed, in NAD  Cardiovascular: RRR, no murmurs Respiratory: normal work of breathing on room air, clear with good air movement bilaterally Abdomen: soft, nontender, nondistended, + bowel sounds Extremities: moving all limbs equally. Warm and well perfused Neuro: alert and awake, grossly normal  Laboratory: Recent Labs  Lab 09/02/17 0248 09/03/17 0324 09/04/17 0538  WBC 3.0* 3.5* 3.2*  HGB 11.4* 11.3* 12.1*  HCT 33.7* 33.9* 36.8*  PLT 103* 111* 142*   Recent Labs  Lab 09/01/17 0833  09/02/17 1236 09/03/17 0324 09/04/17 0538  NA 131*   < > 135 134* 136  K 2.9*   < > 3.7 3.2* 3.3*  CL 98*   < > 106 102 102  CO2 16*   < > 18* 20* 21*  BUN 18   < > 9 5* <5*  CREATININE 1.26*   < > 0.74 0.70 0.67  CALCIUM 9.6   < > 8.4* 8.8* 9.2  PROT 7.5  --   --   --   --   BILITOT 1.0  --   --   --   --  ALKPHOS 80  --   --   --   --   ALT 66*  --   --   --   --   AST 101*  --   --   --   --   GLUCOSE 86   < > 100* 93 102*   < > = values in this interval not displayed.   Ethanol 09/01/17: < 10 Acetaminophen 09/01/17: < 10 Ethylene glycol 09/02/17: none detected   Imaging/Diagnostic Tests: No results found. Transthoracic Echocardiography  Study Date: 09/03/2017 Conclusions - Left ventricle: The cavity size was normal. Wall thickness was   normal. Systolic function was normal. The estimated ejection   fraction was in the range of 60% to 65%. Doppler parameters are   consistent with abnormal left ventricular relaxation (grade 1   diastolic  dysfunction). - Atrial septum: No defect or patent foramen ovale was identified.  Bufford Lope, DO 09/05/2017, 8:20 AM PGY-2, [place] Intern pager: [phone], text pages welcome

## 2017-09-06 NOTE — Discharge Summary (Signed)
[facility] Discharge Summary  Patient name: Anthony [name] Medical record number: [id number] Date of birth: 1955-09-05 Age: 62 y.o. Gender: male Date of Admission: 09/01/2017  Date of Discharge: 09/05/17 Admitting Physician: Kandice Hams, MD  Primary Care Provider: Mercy Riding, MD Consultants: Pulmonology/CCM  Indication for Hospitalization: Altered Mental Status  Discharge Diagnoses/Problem List:  AMS secondary to presumed Alcohol Withdrawal Hx of submassive on 10/2016, s/p anticoagulation with Xarelto Chronic Alcoholism AKI, improved Hypokalemia, Hypomagnesemia, Hypophosphatemia, resolved Pancytopenia (Anemia and Leukopenia) secondary to presumed alcoholism Hypertension HFpEF (G1DD)  Disposition: Home  Discharge Condition: Home  Discharge Exam:  General: sitting up in bed, in NAD  Cardiovascular: RRR, no murmurs Respiratory: normal work of breathing on room air, clear with good air movement bilaterally Abdomen: soft, nontender, nondistended, + bowel sounds Extremities: moving all limbs equally. Warm and well perfused Neuro: alert and awake, grossly normal  Brief Hospital Course:  Anthony [name] is a 62 y.o. male who presented with altered mental status. He was admitted to the ICU due to agitation and delirium and the need of Precedex infusion.  Full workup for acute encephalopathy was initiated.  CT head was negative for any acute etiologies.  MRI was unable to be obtained due to patient's agitation.  UDS and ethanol levels were negative.  Patient Precedex dosage was gradually weaned.  He was transitioned to the [place] floor his altered mental status improved and he was weaned from the drip.  Alcohol cessation resources were offered however patient denied them. He was discharged home in stable condition.  Issues for Follow Up:  1. Pt has SOB. H/o of submassive PE, but V/Q scan negative.  Likely elements of anxiety as well. Continue Xarelto for  1 year from event.  Consider outpatient PFTs  2. Seborrheic dermatitis - consider treating with antifungal shampoo   Significant Procedures: None  Significant Labs and Imaging:  Recent Labs  Lab 09/03/17 0324 09/04/17 0538 09/05/17 0849  WBC 3.5* 3.2* 5.0  HGB 11.3* 12.1* 13.4  HCT 33.9* 36.8* 40.5  PLT 111* 142* 177   Recent Labs  Lab 09/01/17 0833  09/02/17 0248 09/02/17 1236 09/03/17 0324 09/04/17 0538 09/05/17 0849  NA 131*  --  135 135 134* 136 137  K 2.9*  --  2.8* 3.7 3.2* 3.3* 4.4  CL 98*  --  102 106 102 102 103  CO2 16*  --  18* 18* 20* 21* 22  GLUCOSE 86  --  88 100* 93 102* 77  BUN 18  --  11 9 5* <5* 11  CREATININE 1.26*   < > 0.74 0.74 0.70 0.67 1.03  CALCIUM 9.6  --  8.9 8.4* 8.8* 9.2 10.2  MG  --   --  1.7 2.3 1.8 1.5* 1.9  PHOS  --   --  3.8  --   --   --   --   ALKPHOS 80  --   --   --   --   --   --   AST 101*  --   --   --   --   --   --   ALT 66*  --   --   --   --   --   --   ALBUMIN 3.9  --   --   --   --   --   --    < > = values in this interval not displayed.   Transthoracic Echocardiogram -------------------------------------------------------------------  Study Conclusions  - Left ventricle: The cavity size was normal. Wall thickness was   normal. Systolic function was normal. The estimated ejection   fraction was in the range of 60% to 65%. Doppler parameters are   consistent with abnormal left ventricular relaxation (grade 1   diastolic dysfunction). - Atrial septum: No defect or patent foramen ovale was identified.  Dg Chest 2 View  Result Date: 09/01/2017 CLINICAL DATA:  Shortness of breath with altered mental status EXAM: CHEST  2 VIEW COMPARISON:  Chest radiograph April 22, 2017 and chest CT April 22, 2017 FINDINGS: No edema or consolidation. Heart size and pulmonary vascularity are normal. No adenopathy. No evident bone lesions. IMPRESSION: No edema or consolidation. Electronically Signed   By: Lowella Grip III M.D.   On:  09/01/2017 13:31   Ct Head Wo Contrast  Result Date: 09/01/2017 CLINICAL DATA:  Confusion and hallucinations EXAM: CT HEAD WITHOUT CONTRAST TECHNIQUE: Contiguous axial images were obtained from the base of the skull through the vertex without intravenous contrast. COMPARISON:  Head CT August 05, 2012. FINDINGS: Brain: There is mild diffuse atrophy with moderate cerebellar atrophy. There is no intracranial mass, hemorrhage, extra-axial fluid collection, or midline shift. Gray-white compartments appear normal. No evident acute infarct. Vascular: No hyperdense vessels. There is mild calcification in each carotid siphon region. Skull: The bony calvarium appears intact. Sinuses/Orbits: There is mucosal thickening in several ethmoid air cells bilaterally. There is rightward deviation of the nasal septum. Orbits appear symmetric bilaterally. Other: Mastoid air cells are clear. IMPRESSION: Mild supratentorial atrophy with moderate cerebellar atrophy. Gray-white compartments appear normal. No mass or hemorrhage. There is mild arteriovascular calcification in the carotid siphon regions. There is rightward deviation of the nasal septum. There is mucosal thickening in several ethmoid air cells. Electronically Signed   By: Lowella Grip III M.D.   On: 09/01/2017 13:16   HIV - non-reactive  Results/Tests Pending at Time of Discharge: None  Discharge Medications:  Allergies as of 09/05/2017   No Known Allergies     Medication List    STOP taking these medications   chlordiazePOXIDE 25 MG capsule Commonly known as:  LIBRIUM     TAKE these medications   acetaminophen 650 MG CR tablet Commonly known as:  TYLENOL TAKE 1 TABLET (650 MG TOTAL) BY MOUTH EVERY 8 (EIGHT) HOURS AS NEEDED FOR PAIN.   cetaphil lotion Apply topically daily.   cyanocobalamin 1000 MCG tablet Commonly known as:  [employer] VITAMIN B12 Take 1 tablet (1,000 mcg total) by mouth daily.   ferrous sulfate 325 (65 FE) MG tablet Take 325  mg by mouth 2 (two) times a week.   folic acid 1 MG tablet Commonly known as:  FOLVITE Take 1 tablet (1 mg total) by mouth 2 (two) times a week.   hydrochlorothiazide 25 MG tablet Commonly known as:  HYDRODIURIL Take 1 tablet (25 mg total) by mouth daily.   hydroxypropyl methylcellulose / hypromellose 2.5 % ophthalmic solution Commonly known as:  ISOPTO TEARS / GONIOVISC Place 1 drop into both eyes as needed for dry eyes.   methocarbamol 750 MG tablet Commonly known as:  ROBAXIN Take 1 tablet (750 mg total) by mouth every 8 (eight) hours as needed.   multivitamin with minerals Tabs tablet Take 1 tablet by mouth 2 (two) times a week.   PRESERVISION AREDS 2 Caps Take 2 capsules by mouth 2 (two) times a week. What changed:  Another medication with the same name was removed. Continue  taking this medication, and follow the directions you see here.   thiamine 100 MG tablet Commonly known as:  VITAMIN B-1 Take 1 tablet (100 mg total) by mouth 2 (two) times a week.   XARELTO 20 MG Tabs tablet Generic drug:  rivaroxaban TAKE 1 TABLET (20 MG TOTAL) BY MOUTH DAILY WITH SUPPER.       Discharge Instructions: Please refer to Patient Instructions section of EMR for full details.  Patient was counseled important signs and symptoms that should prompt return to medical care, changes in medications, dietary instructions, activity restrictions, and follow up appointments.   Follow-Up Appointments: Follow-up Information    Mercy Riding, MD. Go on 09/08/2017.   Specialty:  Family Medicine Why:  Appointment at 8:50am, arrive 57min early for check in Contact information: [place] [address] [phone]           Bonnita Hollow, MD 09/06/2017, 2:30 PM PGY-1, [place]

## 2017-09-08 ENCOUNTER — Ambulatory Visit: Payer: Medicaid Other | Admitting: Student

## 2017-09-08 ENCOUNTER — Encounter: Payer: Self-pay | Admitting: Student

## 2017-09-08 ENCOUNTER — Other Ambulatory Visit: Payer: Self-pay

## 2017-09-08 VITALS — BP 122/82 | HR 86 | Temp 98.4°F | Ht 70.0 in | Wt 184.8 lb

## 2017-09-08 DIAGNOSIS — G4700 Insomnia, unspecified: Secondary | ICD-10-CM | POA: Diagnosis not present

## 2017-09-08 DIAGNOSIS — R0600 Dyspnea, unspecified: Secondary | ICD-10-CM

## 2017-09-08 DIAGNOSIS — I2699 Other pulmonary embolism without acute cor pulmonale: Secondary | ICD-10-CM

## 2017-09-08 DIAGNOSIS — F411 Generalized anxiety disorder: Secondary | ICD-10-CM | POA: Diagnosis not present

## 2017-09-08 MED ORDER — SERTRALINE HCL 50 MG PO TABS: 50.0000 mg | ORAL_TABLET | Freq: Every day | ORAL | 3 refills | Status: DC

## 2017-09-08 NOTE — Progress Notes (Signed)
Subjective:    Anthony [name] is a 62 y.o. old male here for hospital follow-up.   HPI  Patient was recently hospitalized for altered mental status.  Given history of alcoholism, LFT pattern consistent with alcohol and then negative workups, he is altered mental status was presumed to be due to alcohol withdrawal.  He was initially admitted to ICU on Precedex drips then transferred to floor prior to discharge. Today, patient states that he was hospitalized for confusion and dyspnea.  He states that no one has explained to him why he had dyspnea.  On the other hand, he says he was told that he CTA chest did not show PE.  He reports having dyspnea for about 6 months.  He says, he is dyspnea is exertional. Reports feeling short of breath with as [place] as walking from bus stop to his house or walking from parking lot to exam room.  He says he is dyspnea is getting worse to where he feels short of breath even with taking shower. Denies orthopnea or PND. He reports chronic left ankle swelling. Denies fever, unintentional weight loss, excessive night sweats, cough or chest pain. He asks if this could be anxiety related.  He also reports difficulty sleeping. He denies smoking cigarettes.  He continues to drink alcohol.  He states that he had another drink that day he was discharged from the hospital.  He does not believe his drinking is a problem.  He denies recreational drug use. PMH/Problem List: has Alcohol abuse; Chronic low back pain; HTN (hypertension); Ulnar neuropathy at wrist; Seborrheic dermatitis of scalp; Trigger finger, acquired; Tinea versicolor; Poor social situation; Toe pain; Skin cancer; Hiatal hernia; Right pulmonary embolus ([place]); Macrocytic anemia; Dry eye; Dyspnea; Actinic keratosis; Alcohol withdrawal ([place]); Delirium tremens ([place]); Altered mental status; and Pancytopenia ([place]) on their problem list.   has a past medical history of Hydrocele, Hypertension, Lumbar compression fracture ([place]), and Skin  cancer.  FH:  Family History  Problem Relation Age of Onset  . Heart attack Father        died at 50 years  . Dementia Father   . Stroke Father   . Cancer Sister     [facility] Social History   Tobacco Use  . Smoking status: Never Smoker  . Smokeless tobacco: Never Used  Substance Use Topics  . Alcohol use: Yes    Alcohol/week: 25.2 oz    Types: 42 Cans of beer per week  . Drug use: Yes    Types: Marijuana    Comment: occasional    Review of Systems Review of systems negative except for pertinent positives and negatives in history of present illness above.     Objective:     Vitals:   09/08/17 0833  BP: 122/82  Pulse: 86  Temp: 98.4 F (36.9 C)  TempSrc: Oral  SpO2: 99%  Weight: 184 lb 12.8 oz (83.8 kg)  Height: 5\' 10"  (1.778 m)   Body mass index is 26.52 kg/m.  Physical Exam  GEN: appears well, no apparent distress. Oropharynx: mmm without erythema or exudation HEM: negative for cervical or periauricular lymphadenopathies CVS: RRR, nl s1 & s2, no murmurs, trace edema bilaterally (socks marks) RESP: no IWOB, good air movement bilaterally, CTAB GI: BS present & normal, soft, NTND SKIN: no apparent skin lesion ENDO: negative thyromegally NEURO: alert and oiented appropriately, no gross deficits     Assessment and Plan:  1. Dyspnea, unspecified type: I believe this is anxiety related.  There could be  some element of deconditioning as well. His lung exam is completely normal.  His CTA at his recent admission about a week ago without PE.  He is still taking Xarelto as well.  His recent echocardiogram about 5 days ago was basically normal.  He has no signs of fluid overload.  No significant smoking history to think of COPD either.  Recent hemoglobin within normal except for macrocytosis likely due to his alcohol use.  He has no constitutional symptoms or B symptoms to think of malignancy.  After a long discussion about the above unlikely conditions he believes his  dyspnea could be related to his anxiety.  So, we will manage his anxiety as below.  2. Anxiety state: I wonder if this is secondary to alcohol withdrawal or primary.  Unfortunately he does not think his alcohol is a problem.  He is not ready to quit drinking.  He had it during the day he was discharged from the hospital. He is open to trying medication for his anxiety.  He denies personal or family history of bipolar disorder.  We will try Zoloft.  Follow-up in 2 weeks. - sertraline (ZOLOFT) 50 MG tablet; Take 1 tablet (50 mg total) by mouth daily.  Dispense: 30 tablet; Refill: 3  3. Insomnia, unspecified type: Discussed about sleep hygiene.  Gave him handout.  Zoloft might help as well if his insomnia is anxiety related.  4. Right pulmonary embolus [facility]): He had unprovoked PE about 8 months ago.  He has been on Xarelto since then which is considered adequate treatment.  His CTA at recent hospitalization about a week ago was normal.  We discussed about the risks and benefits and stopped his Xarelto today.  He is at increased risk of another PE compared to an average person due to his previous PE.  Obviously, there is increased risk of bleeding with Xarelto as well,  especially with his drinking habit.  He voices understanding the risks and benefits.   Return in about 2 weeks (around 09/22/2017) for Anxiety and sleep.  Mercy Riding, MD 09/08/17 Pager: [phone]

## 2017-09-08 NOTE — Patient Instructions (Addendum)
It was great seeing you today! We have addressed the following issues today  Shortness of breath/anxiety: As you told, this could be just anxiety related.  It could also be due to deconditioning.  I have low suspicion for major lung or heart related condition.  We have started you on a new medication for anxiety.  Please take this medication daily.  Please come back and see [place] in 2 weeks or sooner as needed.  Blood clots/pulmonary embolism: You have been on the blood thinner for almost a year now which should be adequate treatment.  We are stopping his Xarelto today.  As we discussed in the office, you have increased risk of another blood clot due to your previous blood clot.   Medication refills: Please contact your pharmacy with the medications you need a refill on.  Sleep issue: The anxiety medication restarted you on could help you with sleep to some degrees.  I also recommend trying a sleep hygiene.  Please read the handout we gave you about this   If we did any lab work today, and the results require attention, either me or my nurse will get in touch with you. If everything is normal, you will get a letter in mail and a message via . If you don't hear from [place] in two weeks, please give [place] a call. Otherwise, we look forward to seeing you again at your next visit. If you have any questions or concerns before then, please call the clinic at [phone].  Please bring all your medications to every doctors visit  Sign up for My Chart to have easy access to your labs results, and communication with your Primary care physician.    Please check-out at the front desk before leaving the clinic.    Take Care,   Dr. Cyndia Skeeters

## 2017-09-22 ENCOUNTER — Ambulatory Visit: Payer: Medicaid Other | Admitting: Student

## 2017-09-22 ENCOUNTER — Other Ambulatory Visit: Payer: Self-pay

## 2017-09-22 ENCOUNTER — Encounter: Payer: Self-pay | Admitting: Student

## 2017-09-22 VITALS — BP 110/74 | HR 87 | Temp 97.9°F | Wt 180.0 lb

## 2017-09-22 DIAGNOSIS — M545 Low back pain: Secondary | ICD-10-CM

## 2017-09-22 DIAGNOSIS — F411 Generalized anxiety disorder: Secondary | ICD-10-CM | POA: Diagnosis present

## 2017-09-22 DIAGNOSIS — R21 Rash and other nonspecific skin eruption: Secondary | ICD-10-CM | POA: Diagnosis not present

## 2017-09-22 DIAGNOSIS — G8929 Other chronic pain: Secondary | ICD-10-CM | POA: Diagnosis not present

## 2017-09-22 MED ORDER — PREDNISONE 10 MG (21) PO TBPK: ORAL_TABLET | ORAL | 0 refills | Status: DC

## 2017-09-22 MED ORDER — SERTRALINE HCL 50 MG PO TABS: 50.0000 mg | ORAL_TABLET | Freq: Every day | ORAL | 3 refills | Status: DC

## 2017-09-22 MED ORDER — CETIRIZINE HCL 10 MG PO TABS: 10.0000 mg | ORAL_TABLET | Freq: Every day | ORAL | 11 refills | Status: DC

## 2017-09-22 NOTE — Patient Instructions (Signed)
It was great seeing you today! We have addressed the following issues today  Anxiety: Continue taking his Zoloft.  We sent a refill to your pharmacy.  Follow-up in 1 month.  Back pain: Continue taking your pain medications.  You can also try back exercise as below.  Skin rash: We sent a prescription for steroid and Zyrtec to your pharmacy.  Zyrtec might be available over-the-counter.  We also send a referral to dermatology.  If we did any lab work today, and the results require attention, either me or my nurse will get in touch with you. If everything is normal, you will get a letter in mail and a message via . If you don't hear from [place] in two weeks, please give [place] a call. Otherwise, we look forward to seeing you again at your next visit. If you have any questions or concerns before then, please call the clinic at [phone].  Please bring all your medications to every doctors visit  Sign up for My Chart to have easy access to your labs results, and communication with your Primary care physician.    Please check-out at the front desk before leaving the clinic.    Take Care,   Dr. Cyndia Skeeters  Back Exercises If you have pain in your back, do these exercises 2-3 times each day or as told by your doctor. When the pain goes away, do the exercises once each day, but repeat the steps more times for each exercise (do more repetitions). If you do not have pain in your back, do these exercises once each day or as told by your doctor. Exercises Single Knee to Chest  Do these steps 3-5 times in a row for each leg: 1. Lie on your back on a firm bed or the floor with your legs stretched out. 2. Bring one knee to your chest. 3. Hold your knee to your chest by grabbing your knee or thigh. 4. Pull on your knee until you feel a gentle stretch in your lower back. 5. Keep doing the stretch for 10-30 seconds. 6. Slowly let go of your leg and straighten it.  Pelvic Tilt  Do these steps 5-10 times in a  row: 1. Lie on your back on a firm bed or the floor with your legs stretched out. 2. Bend your knees so they point up to the ceiling. Your feet should be flat on the floor. 3. Tighten your lower belly (abdomen) muscles to press your lower back against the floor. This will make your tailbone point up to the ceiling instead of pointing down to your feet or the floor. 4. Stay in this position for 5-10 seconds while you gently tighten your muscles and breathe evenly.  Cat-Cow  Do these steps until your lower back bends more easily: 1. Get on your hands and knees on a firm surface. Keep your hands under your shoulders, and keep your knees under your hips. You may put padding under your knees. 2. Let your head hang down, and make your tailbone point down to the floor so your lower back is round like the back of a cat. 3. Stay in this position for 5 seconds. 4. Slowly lift your head and make your tailbone point up to the ceiling so your back hangs low (sags) like the back of a cow. 5. Stay in this position for 5 seconds.  Press-Ups  Do these steps 5-10 times in a row: 1. Lie on your belly (face-down) on the floor.  2. Place your hands near your head, about shoulder-width apart. 3. While you keep your back relaxed and keep your hips on the floor, slowly straighten your arms to raise the top half of your body and lift your shoulders. Do not use your back muscles. To make yourself more comfortable, you may change where you place your hands. 4. Stay in this position for 5 seconds. 5. Slowly return to lying flat on the floor.  Bridges  Do these steps 10 times in a row: 1. Lie on your back on a firm surface. 2. Bend your knees so they point up to the ceiling. Your feet should be flat on the floor. 3. Tighten your butt muscles and lift your butt off of the floor until your waist is almost as high as your knees. If you do not feel the muscles working in your butt and the back of your thighs, slide your  feet 1-2 inches farther away from your butt. 4. Stay in this position for 3-5 seconds. 5. Slowly lower your butt to the floor, and let your butt muscles relax.  If this exercise is too easy, try doing it with your arms crossed over your chest. Belly Crunches  Do these steps 5-10 times in a row: 1. Lie on your back on a firm bed or the floor with your legs stretched out. 2. Bend your knees so they point up to the ceiling. Your feet should be flat on the floor. 3. Cross your arms over your chest. 4. Tip your chin a Fazzini bit toward your chest but do not bend your neck. 5. Tighten your belly muscles and slowly raise your chest just enough to lift your shoulder blades a tiny bit off of the floor. 6. Slowly lower your chest and your head to the floor.  Back Lifts Do these steps 5-10 times in a row: 1. Lie on your belly (face-down) with your arms at your sides, and rest your forehead on the floor. 2. Tighten the muscles in your legs and your butt. 3. Slowly lift your chest off of the floor while you keep your hips on the floor. Keep the back of your head in line with the curve in your back. Look at the floor while you do this. 4. Stay in this position for 3-5 seconds. 5. Slowly lower your chest and your face to the floor.  Contact a doctor if:  Your back pain gets a lot worse when you do an exercise.  Your back pain does not lessen 2 hours after you exercise. If you have any of these problems, stop doing the exercises. Do not do them again unless your doctor says it is okay. Get help right away if:  You have sudden, very bad back pain. If this happens, stop doing the exercises. Do not do them again unless your doctor says it is okay. This information is not intended to replace advice given to you by your health care provider. Make sure you discuss any questions you have with your health care provider. Document Released: 08/08/2010 Document Revised: 12/12/2015 Document Reviewed:  08/30/2014 Elsevier Interactive Patient Education  [employer].

## 2017-09-22 NOTE — Progress Notes (Signed)
Subjective:    Anthony Anthony [name] is a 62 y.o. old male here for follow-up on anxiety.  He also likes to discuss about pain and skin rash.  HPI Anxiety: Start the patient on Zoloft 50 mg daily 2 weeks ago.  Patient reports good compliance with this medication.  He reports improvement in his anxiety and sleep.  He also reports cutting down on his drinking.  He says he only had 2 beers over the weekend.  Back pain: History of chronic back pain.  He said he starts going gym and started lifting, stationary bike and walking.  Pain is over the lower back.  No radiation.  Describes the pain as achiness.  Denies fever, numbness, tingling or leg weakness.  Denies urinary retention, bowel or bladder issue or saddle anesthesia. He has baseline urge of urination.  He is on hydrochlorothiazide.  Skin rash: Has history of actinic keratosis.  Used to be followed by dermatologist.  Referred him to a dermatologist about a year ago but did not make to his appointment at that time due to transportation.  Rashes on his face, chest and back.  Rash is pruritic.  Has no recent illness or recent change.  PMH/Problem List: has Alcohol abuse; Chronic low back pain; HTN (hypertension); Ulnar neuropathy at wrist; Seborrheic dermatitis of scalp; Trigger finger, acquired; Tinea versicolor; Poor social situation; Toe pain; Skin cancer; Hiatal hernia; Right pulmonary embolus (Anthony [name]); Macrocytic anemia; Dry eye; Dyspnea; Actinic keratosis; Alcohol withdrawal (Anthony [name]); Delirium tremens (Anthony [name]); Altered mental status; and Pancytopenia (Anthony [name]) on their problem list.   has a past medical history of Hydrocele, Hypertension, Lumbar compression fracture ([place]), and Skin cancer.  FH:  Family History  Problem Relation Age of Onset  . Heart attack Father        died at 72 years  . Dementia Father   . Stroke Father   . Cancer Sister     [name] Social History   Tobacco Use  . Smoking status: Never Smoker  . Smokeless tobacco: Never Used  Substance Use  Topics  . Alcohol use: Yes    Alcohol/week: 25.2 oz    Types: 42 Cans of beer per week  . Drug use: Yes    Types: Marijuana    Comment: occasional    Review of Systems Review of systems negative except for pertinent positives and negatives in history of present illness above.     Objective:     Vitals:   09/22/17 0828  BP: 110/74  Pulse: 87  Temp: 97.9 F (36.6 C)  TempSrc: Oral  SpO2: 98%  Weight: 180 lb (81.6 kg)   Body mass index is 25.83 kg/m.  Physical Exam  GEN: appears well, no apparent distress. CVS: RRR, nl s1 & s2, no murmurs, no edema RESP: no IWOB, good air movement bilaterally, CTAB GI: BS present & normal, soft, NTND MSK: Back & LE exam  Erythematous diffuse macules over his upper back with some scaling, spine with normal alignment and no deformity. LE appears symmetric  No step offs, no tenderness to palpation over spines.  Mild tenderness over lumbosacral paraspinous muscles.    ROM is full at the lumbosacral region  Lying and seated SLR negative  Neuro exam in LE: motor 5/5 in all muscle groups, light sensation intact in L4-S1 dermatomes, patellar reflexes 2+ bilaterally  SKIN: Erythematous diffuse macules over his face, chest, arms and upper back with some scaling.  Some with coarse texture.  NEURO: as above PSYCH: euthymic mood with congruent  affect  GAD 7 : Generalized Anxiety Score 09/22/2017  Nervous, Anxious, on Edge 1  Control/stop worrying 0  Worry too much - different things 0  Trouble relaxing 1  Restless 1  Easily annoyed or irritable 1  Afraid - awful might happen 0  Total GAD 7 Score 4  Anxiety Difficulty Somewhat difficult    Assessment and Plan:  1. Anxiety state: Reports improvement in his symptoms and sleep. Scored 4 on GAD-7 today.  Cut down on his drinking as well.  Tolerating the Zoloft well.  We will continue Zoloft 50 mg daily.  Follow-up in 1 months. - sertraline (ZOLOFT) 50 MG tablet; Take 1 tablet (50 mg total) by  mouth daily.  Dispense: 30 tablet; Refill: 3  2. Chronic bilateral low back pain without sciatica: Likely lower back muscle strain.  Exam with tenderness to palpation over lumbosacral paraspinal muscles.  No tenderness over spinous process.  Neuro exam within normal limits.  No red flags for infectious etiology, fracture or malignancy.  Discussed about back exercise and gave him handout.  Recommended taking extra strength Tylenol every 6 hours. Continue Robaxin.   3. Rash and nonspecific skin eruption: Likely actinic keratosis.  He has history of this.  Not clear why he is pruritic.  Will try Sterapred and Zyrtec.  Referral to dermatology. - predniSONE (STERAPRED UNI-PAK 21 TAB) 10 MG (21) TBPK tablet; 6, 5, 4, 3, 2 and 1  Dispense: 21 tablet; Refill: 0 - cetirizine (ZYRTEC) 10 MG tablet; Take 1 tablet (10 mg total) by mouth daily.  Dispense: 30 tablet; Refill: 11 - Ambulatory referral to Dermatology  Return in about 1 month (around 10/23/2017) for Anxiety.  Mercy Riding, MD 09/22/17 Pager: [phone]

## 2017-10-13 DIAGNOSIS — L570 Actinic keratosis: Secondary | ICD-10-CM | POA: Diagnosis not present

## 2017-10-13 DIAGNOSIS — L219 Seborrheic dermatitis, unspecified: Secondary | ICD-10-CM | POA: Diagnosis not present

## 2017-10-21 ENCOUNTER — Ambulatory Visit: Payer: Self-pay | Admitting: Student

## 2017-11-09 ENCOUNTER — Other Ambulatory Visit: Payer: Self-pay | Admitting: Student

## 2017-11-09 DIAGNOSIS — M5441 Lumbago with sciatica, right side: Principal | ICD-10-CM

## 2017-11-09 DIAGNOSIS — G8929 Other chronic pain: Secondary | ICD-10-CM

## 2017-11-18 ENCOUNTER — Emergency Department (HOSPITAL_COMMUNITY): Payer: Medicaid Other

## 2017-11-18 ENCOUNTER — Encounter (HOSPITAL_COMMUNITY): Payer: Self-pay

## 2017-11-18 ENCOUNTER — Emergency Department (HOSPITAL_COMMUNITY)
Admission: EM | Admit: 2017-11-18 | Discharge: 2017-11-18 | Disposition: A | Discharge status: Home / Self Care | Payer: Medicaid Other | Attending: Emergency Medicine | Admitting: Emergency Medicine

## 2017-11-18 DIAGNOSIS — W19XXXA Unspecified fall, initial encounter: Secondary | ICD-10-CM | POA: Insufficient documentation

## 2017-11-18 DIAGNOSIS — F10920 Alcohol use, unspecified with intoxication, uncomplicated: Secondary | ICD-10-CM | POA: Diagnosis not present

## 2017-11-18 DIAGNOSIS — Z79899 Other long term (current) drug therapy: Secondary | ICD-10-CM | POA: Insufficient documentation

## 2017-11-18 DIAGNOSIS — R0781 Pleurodynia: Secondary | ICD-10-CM

## 2017-11-18 DIAGNOSIS — Z7901 Long term (current) use of anticoagulants: Secondary | ICD-10-CM | POA: Diagnosis not present

## 2017-11-18 DIAGNOSIS — I10 Essential (primary) hypertension: Secondary | ICD-10-CM | POA: Insufficient documentation

## 2017-11-18 DIAGNOSIS — M25552 Pain in left hip: Secondary | ICD-10-CM

## 2017-11-18 DIAGNOSIS — R51 Headache: Secondary | ICD-10-CM | POA: Diagnosis present

## 2017-11-18 LAB — CBC
HCT: 38.2 % — ABNORMAL LOW (ref 39.0–52.0)
Hemoglobin: 13.1 g/dL (ref 13.0–17.0)
MCH: 33.3 pg (ref 26.0–34.0)
MCHC: 34.3 g/dL (ref 30.0–36.0)
MCV: 97.2 fL (ref 78.0–100.0)
PLATELETS: 221 10*3/uL (ref 150–400)
RBC: 3.93 MIL/uL — ABNORMAL LOW (ref 4.22–5.81)
RDW: 14.5 % (ref 11.5–15.5)
WBC: 4.8 10*3/uL (ref 4.0–10.5)

## 2017-11-18 LAB — BASIC METABOLIC PANEL
ANION GAP: 12 (ref 5–15)
BUN: 9 mg/dL (ref 6–20)
CALCIUM: 8.5 mg/dL — AB (ref 8.9–10.3)
CO2: 20 mmol/L — ABNORMAL LOW (ref 22–32)
Chloride: 105 mmol/L (ref 101–111)
Creatinine, Ser: 0.72 mg/dL (ref 0.61–1.24)
GLUCOSE: 107 mg/dL — AB (ref 65–99)
POTASSIUM: 3.9 mmol/L (ref 3.5–5.1)
SODIUM: 137 mmol/L (ref 135–145)

## 2017-11-18 MED ORDER — ACETAMINOPHEN 325 MG PO TABS
650.0000 mg | ORAL_TABLET | Freq: Once | ORAL | Status: AC
  Administered 2017-11-18: 650 mg via ORAL
  Filled 2017-11-18: qty 2

## 2017-11-18 MED ORDER — IBUPROFEN 800 MG PO TABS
800.0000 mg | ORAL_TABLET | Freq: Once | ORAL | Status: AC
  Administered 2017-11-18: 800 mg via ORAL
  Filled 2017-11-18: qty 1

## 2017-11-18 NOTE — ED Notes (Signed)
Pt intoxicated. Pt upset about being in C-Collar. States his mother hung herself and he doesn't like things on his neck. RN advised Pt not to remove collar.

## 2017-11-18 NOTE — ED Notes (Signed)
Pt ambulated with steady gait

## 2017-11-18 NOTE — ED Notes (Signed)
Pt threatening to leave if he doesn't get pain meds.

## 2017-11-18 NOTE — ED Notes (Signed)
scanner note working

## 2017-11-18 NOTE — ED Triage Notes (Signed)
Patient arrived by [facility]-following fall at home, ETOH present and patient with unsteady gait and slurred speech. Patient arrived in c-collar, will not follow commands. Alert and responds to questions. Complains of increased back pain, has chronic back issues per EMS

## 2017-11-18 NOTE — ED Notes (Signed)
Pt taken to bathroom.  Pt had steady gait when ambulating.

## 2017-11-18 NOTE — ED Notes (Signed)
Pt verbalized understanding discharge instructions and denies any further needs or questions at this time. VS stable, ambulatory and steady gait.   

## 2017-11-18 NOTE — ED Notes (Signed)
Pt refusing xrays

## 2017-11-18 NOTE — Discharge Instructions (Addendum)
You came into the emergency department with a fall. We did a cat scan of your head and neck which were normal. Your x-rays were normal. Your labs were also normal. Please follow-up with your primary care doctor in the next 1-2 weeks.

## 2017-11-18 NOTE — ED Notes (Signed)
Patient transported to CT 

## 2017-11-18 NOTE — ED Provider Notes (Signed)
I saw and evaluated the patient, reviewed the resident's note and I agree with the findings and plan.   EKG Interpretation None     62 year old male here intoxicated after a fall.  Head and neck CT are pending at this time.  Patient is threatening to leave and he is not medically stable to do this at this time and due to intoxication does not have capacity to make that decision.  I placed in a 24-hour hold and he will be discharged when he is sober   Lacretia Leigh, MD 11/18/17 1347

## 2017-11-18 NOTE — ED Provider Notes (Addendum)
[facility] EMERGENCY DEPARTMENT Provider Note   CSN: [id number] Arrival date & time: 11/18/17  1106   History   Chief Complaint Fall   HPI Anthony [name] is a 62 y.o. male with a PMH of HTN, hx PE (taking Xarelto once per week), hx lumbar compression fracture, and alcohol abuse presenting to the ED after a fall. He states that he was drinking alcohol the entire day yesterday and this morning. He states that he thinks he fell at home. He is unsure if he fell last night or this morning. He states that his friends called 911. He doesn't remember how he fell. He states that his head hurts. No vision changes. His left rib cage also hurts, but patient states that he has broken ribs, so they always hurt. No neck pain, no abdominal pain, no pain in his arms or legs. He has a history of PE and has been on Xarelto in the past. He states that his PCP told him to stop taking his Xarelto, but he thinks he still needs it so he takes one tablet every Monday.  Past Medical History:  Diagnosis Date  . Hydrocele    Surgically corrected  . Hypertension   . Lumbar compression fracture ([place])   . Skin cancer    exciced 2017    Patient Active Problem List   Diagnosis Date Noted  . Pancytopenia ([place])   . Alcohol withdrawal ([place]) 09/01/2017  . Delirium tremens ([place]) 09/01/2017  . Altered mental status   . Dyspnea 11/27/2016  . Actinic keratosis 11/27/2016  . Macrocytic anemia 11/23/2016  . Dry eye 11/23/2016  . Right pulmonary embolus ([place]) 11/02/2016  . Hiatal hernia 09/14/2016  . Skin cancer 08/13/2016  . Toe pain 07/07/2013  . Poor social situation 06/09/2013  . Tinea versicolor 06/08/2013  . Trigger finger, acquired 11/18/2012  . 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 HTN (hypertension) 04/11/2012  . Alcohol abuse 09/16/2006  . Chronic low back pain 09/16/2006    Past Surgical History:  Procedure Laterality Date  . HIATAL  HERNIA REPAIR  2016  . HYDROCELE EXCISION / REPAIR  2010  . SKIN CANCER EXCISION Left    Excised 2017        Home Medications    Prior to Admission medications   Medication Sig Start Date End Date Taking? Authorizing Provider  acetaminophen (TYLENOL) 650 MG CR tablet TAKE 1 TABLET (650 MG TOTAL) BY MOUTH EVERY 8 (EIGHT) HOURS AS NEEDED FOR PAIN. 05/24/17   Mercy Riding, MD  cetaphil (CETAPHIL) lotion Apply topically daily. 09/06/17   Bufford Lope, DO  cetirizine (ZYRTEC) 10 MG tablet Take 1 tablet (10 mg total) by mouth daily. 09/22/17   Mercy Riding, MD  cyanocobalamin ([employer] VITAMIN B12) 1000 MCG tablet Take 1 tablet (1,000 mcg total) by mouth daily. 07/28/17   Mercy Riding, MD  ferrous sulfate 325 (65 FE) MG tablet Take 325 mg by mouth 2 (two) times a week.    [provider]  folic acid (FOLVITE) 1 MG tablet Take 1 tablet (1 mg total) by mouth 2 (two) times a week. 11/23/16   Mercy Riding, MD  hydrochlorothiazide (HYDRODIURIL) 25 MG tablet Take 1 tablet (25 mg total) by mouth daily. 11/27/16   Mercy Riding, MD  hydroxypropyl methylcellulose / hypromellose (ISOPTO TEARS / GONIOVISC) 2.5 % ophthalmic solution Place 1 drop into both eyes as needed for dry  eyes.    [provider]  methocarbamol (ROBAXIN) 750 MG tablet TAKE 1 TABLET (750 MG TOTAL) BY MOUTH EVERY 8 (EIGHT) HOURS AS NEEDED. 11/09/17   Mercy Riding, MD  Multiple Vitamin (MULTIVITAMIN WITH MINERALS) TABS tablet Take 1 tablet by mouth 2 (two) times a week.    [provider]  Multiple Vitamins-Minerals (PRESERVISION AREDS 2) CAPS Take 2 capsules by mouth 2 (two) times a week. Patient not taking: Reported on 09/01/2017 11/23/16   Mercy Riding, MD  predniSONE (STERAPRED UNI-PAK 21 TAB) 10 MG (21) TBPK tablet 6, 5, 4, 3, 2 and 1 09/22/17   Gonfa, Taye T, MD  sertraline (ZOLOFT) 50 MG tablet Take 1 tablet (50 mg total) by mouth daily. 09/22/17   Mercy Riding, MD  thiamine (VITAMIN B-1) 100 MG tablet Take 1 tablet  (100 mg total) by mouth 2 (two) times a week. 11/23/16   Mercy Riding, MD    Family History Family History  Problem Relation Age of Onset  . Heart attack Father        died at 64 years  . Dementia Father   . Stroke Father   . Cancer Sister     Social History Social History   Tobacco Use  . Smoking status: Never Smoker  . Smokeless tobacco: Never Used  Substance Use Topics  . Alcohol use: Yes    Alcohol/week: 25.2 oz    Types: 42 Cans of beer per week  . Drug use: Yes    Types: Marijuana    Comment: occasional     Allergies   Patient has no known allergies.   Review of Systems Review of Systems   Physical Exam Updated Vital Signs BP (!) 123/91 (BP Location: Left Arm)   Pulse (!) 128   Temp 98.9 F (37.2 C) (Oral)   Resp 20   SpO2 95%   Physical Exam  Constitutional: He appears well-developed. No distress.  Intoxicated   HENT:  Head: Normocephalic.  Superficial abrasion present over the left side of the forehead, no active bleeding  Eyes: EOM are normal. No scleral icterus.  Pupils dilated, but are symmetric and reactive to light.  Neck: Neck supple.  No tenderness to palpation, moving neck around without any issues or pain  Cardiovascular: Regular rhythm.  No murmur heard. Tachycardic   Pulmonary/Chest: Effort normal and breath sounds normal. He has no wheezes.  Abdominal: Soft. Bowel sounds are normal. He exhibits no distension. There is no tenderness. There is no rebound and no guarding.  Musculoskeletal: Normal range of motion. He exhibits no edema, tenderness or deformity.  Ecchymosis and tenderness present over the left ilium  Neurological: He is alert. No cranial nerve deficit or sensory deficit. Coordination normal.  Skin: Skin is warm and dry. No rash noted.     ED Treatments / Results  Labs (all labs ordered are listed, but only abnormal results are displayed) Labs Reviewed - No data to display  EKG None  Radiology No results  found.  Procedures Procedures (including critical care time)  Medications Ordered in ED Medications - No data to display   Initial Impression / Assessment and Plan / ED Course  I have reviewed the triage vital signs and the nursing notes.  Pertinent labs & imaging results that were available during my care of the patient were reviewed by me and considered in my medical decision making (see chart for details).  62 year old male presenting to clinic with  a fall while being intoxicated. Doesn't remember the fall, but he does have a superficial abrasion on the left side of his forehead. Not supposed to be taking Xarelto but he's taking it once a week. Will order CT head, CT c-spine, x-ray pelvis and L hip, CXR as well as CBC, BMP, EKG (due to patient's tachycardia).   1:30PM: Patient requesting to leave, but is intoxicated. CT head and C-spine without acute abnormalities. Patient refused CXR and x-ray pelvis and left hip. Medically stable for discharge, but will do 24 hour hold until patient becomes more sober.  2:45PM: Patient agreeable for x-rays now. Stating his ribs hurt. Will order x-ray L ribs and chest.  3:15PM: Patient now able to ambulate without any issues or discomfort. No balance issues. Alert and oriented x 4. No issues. Stable for discharge home.  Final Clinical Impressions(s) / ED Diagnoses   Final diagnoses:  None    ED Discharge Orders    None       Deshara Rossi, Pete Pelt, MD 11/18/17 1411    Sela Hua, MD 11/18/17 1520

## 2017-11-18 NOTE — ED Notes (Signed)
Pt has removed C-Collar himself.  Provider at the bedside.

## 2017-11-18 NOTE — ED Notes (Signed)
Pt yelling at RN threatening to leave again.  Pt warned Police will get involved if he tries to leave.

## 2017-11-26 ENCOUNTER — Ambulatory Visit: Payer: Medicaid Other | Admitting: Student

## 2017-11-26 VITALS — BP 118/80 | HR 92 | Temp 97.9°F | Ht 70.0 in | Wt 182.4 lb

## 2017-11-26 DIAGNOSIS — G5621 Lesion of ulnar nerve, right upper limb: Secondary | ICD-10-CM | POA: Diagnosis not present

## 2017-11-26 DIAGNOSIS — R42 Dizziness and giddiness: Secondary | ICD-10-CM

## 2017-11-26 MED ORDER — VITAMIN B-6 25 MG PO TABS: 25.0000 mg | ORAL_TABLET | Freq: Every day | ORAL | 0 refills | Status: DC

## 2017-11-26 MED ORDER — NAPROXEN 500 MG PO TABS: 500.0000 mg | ORAL_TABLET | Freq: Two times a day (BID) | ORAL | 0 refills | Status: DC

## 2017-11-26 NOTE — Patient Instructions (Addendum)
It was great seeing you today! We have addressed the following issues today  Dizziness/lightheadedness: I strongly recommend you quit drinking.  I am also concerned about you pain medication which can contribute to his symptoms.  Right arm/hand numbness: This is likely due to nerve compression in your elbow.  You may try vitamin B6/pyridoxine and naproxen.  Both are available over-the-counter.  We have also send a prescription to the pharmacy.    If we did any lab work today, and the results require attention, either me or my nurse will get in touch with you. If everything is normal, you will get a letter in mail and a message via . If you don't hear from [place] in two weeks, please give [place] a call. Otherwise, we look forward to seeing you again at your next visit. If you have any questions or concerns before then, please call the clinic at [phone].  Please bring all your medications to every doctors visit  Sign up for My Chart to have easy access to your labs results, and communication with your Primary care physician.    Please check-out at the front desk before leaving the clinic.    Take Care,   Dr. Cyndia Skeeters

## 2017-11-26 NOTE — Progress Notes (Signed)
Subjective:    Anthony [name] is a 62 y.o. old male here for dizziness and numbness in his right hand forearm  HPI Dizziness: felt lightheaded and fell down went to ED about a week ago.  Not sure about loss of consciousness.  Per ED note, intoxicated with alcohol.  Not ready to quit drinking.  He does not think his alcohol use is a problem.  He is also on methocarbamol for his back pain.  We discussed about the side effect of this medication.  He says he has been on this for long time and does want it changed.   Right hand numbness: Chronic issue.  Reports numbness in his liter of and ring finger.  Numbness goes up below his wrist joint.  MMR booster: asks if he needs MMR booster given the news of measles outbreak. He was born in [birth year].  PMH/Problem List: has Alcohol abuse; Chronic low back pain; HTN (hypertension); Ulnar tunnel syndrome of right wrist; Seborrheic dermatitis of scalp; Trigger finger, acquired; Tinea versicolor; Poor social situation; Toe pain; Skin cancer; Hiatal hernia; Right pulmonary embolus ([place]); Macrocytic anemia; Dry eye; Dyspnea; Actinic keratosis; Alcohol withdrawal ([place]); Delirium tremens ([place]); Altered mental status; Pancytopenia ([place]); and Lightheadedness on their problem list.   has a past medical history of Hydrocele, Hypertension, Lumbar compression fracture ([place]), and Skin cancer.  FH:  Family History  Problem Relation Age of Onset  . Heart attack Father        died at 73 years  . Dementia Father   . Stroke Father   . Cancer Sister     [facility] Social History   Tobacco Use  . Smoking status: Never Smoker  . Smokeless tobacco: Never Used  Substance Use Topics  . Alcohol use: Yes    Alcohol/week: 25.2 oz    Types: 42 Cans of beer per week  . Drug use: Yes    Types: Marijuana    Comment: occasional    Review of Systems Review of systems negative except for pertinent positives and negatives in history of present illness above.     Objective:     Vitals:   11/26/17 1347  BP: 118/80  Pulse: 92  Temp: 97.9 F (36.6 C)  TempSrc: Oral  SpO2: 96%  Weight: 182 lb 6.4 oz (82.7 kg)  Height: _0  (1.778 m)   Body mass index is 26.17 kg/m.  Physical Exam  GEN: appears well & comfortable. No apparent distress. Head: clean looking healing bruise over his left forehead. Ears: external ear, ear canal and TM normal CVS: RRR, nl s1 & s2, no murmurs, no edema RESP: no IWOB, good air movement bilaterally, CTAB MSK: Wrist and Hand:   Appears symmetric, no apparent swelling, atrophy, skin changes or deformity.  No tenderness to palpation  Full ROM wrist/hand/digits  Tinel signs negative   Neuro: grip, wrist and digit flexion and extension, radial or ulnar deviation, elbow supination or pronation, digital abduction and adduction and opposition are 5/5 and painless. Light sensation intact in all dermatomes.   CVS: radial and ulnar pulses normal. Cap refills brisk. No cyanosis  SKIN: healing bruise over his left forehead NEURO: alert and oiented appropriately, no gross deficits   PSYCH: appropriate    Assessment and Plan:  1. Lightheadedness: likely due to EtOH intoxication. History of alcohol use disorder and withdrawal. Still doesn't think his alcohol use is a problem. Not ready to quit. He is also on Robaxin for back pain which could cause dizziness but he  says he can't move without it due to his back pain.  2. Ulnar tunnel syndrome of right wrist: distribution of his symptoms consistent with ulnar nerve compression in his elbow.  Exam normal - vitamin B-6 (PYRIDOXINE) 25 MG tablet; Take 1 tablet (25 mg total) by mouth daily.  Dispense: 30 tablet; Refill: 0 - naproxen (NAPROSYN) 500 MG tablet; Take 1 tablet (500 mg total) by mouth 2 (two) times daily with a meal.  Dispense: 30 tablet; Refill: 0  3. MMR booster: born in [birth year]. He should be immune  Return if symptoms worsen or fail to improve.  Mercy Riding, MD 11/28/17 Pager:  [phone]

## 2017-11-28 ENCOUNTER — Encounter: Payer: Self-pay | Admitting: Student

## 2017-11-28 DIAGNOSIS — R42 Dizziness and giddiness: Secondary | ICD-10-CM | POA: Insufficient documentation

## 2017-12-31 ENCOUNTER — Other Ambulatory Visit: Payer: Self-pay | Admitting: Student

## 2017-12-31 DIAGNOSIS — I10 Essential (primary) hypertension: Secondary | ICD-10-CM

## 2018-01-04 ENCOUNTER — Inpatient Hospital Stay (HOSPITAL_COMMUNITY): Payer: Medicaid Other

## 2018-01-04 ENCOUNTER — Inpatient Hospital Stay (HOSPITAL_COMMUNITY)
Admission: EM | Admit: 2018-01-04 | Discharge: 2018-01-07 | DRG: 897 | Disposition: A | Discharge status: Home / Self Care | Payer: Medicaid Other | Attending: Internal Medicine | Admitting: Internal Medicine

## 2018-01-04 ENCOUNTER — Encounter (HOSPITAL_COMMUNITY): Payer: Self-pay | Admitting: Emergency Medicine

## 2018-01-04 ENCOUNTER — Other Ambulatory Visit: Payer: Self-pay

## 2018-01-04 DIAGNOSIS — N179 Acute kidney failure, unspecified: Secondary | ICD-10-CM | POA: Diagnosis present

## 2018-01-04 DIAGNOSIS — G8929 Other chronic pain: Secondary | ICD-10-CM

## 2018-01-04 DIAGNOSIS — F10932 Alcohol use, unspecified with withdrawal with perceptual disturbance: Secondary | ICD-10-CM

## 2018-01-04 DIAGNOSIS — Z809 Family history of malignant neoplasm, unspecified: Secondary | ICD-10-CM

## 2018-01-04 DIAGNOSIS — F10231 Alcohol dependence with withdrawal delirium: Secondary | ICD-10-CM | POA: Diagnosis present

## 2018-01-04 DIAGNOSIS — F419 Anxiety disorder, unspecified: Secondary | ICD-10-CM | POA: Diagnosis present

## 2018-01-04 DIAGNOSIS — Z86711 Personal history of pulmonary embolism: Secondary | ICD-10-CM

## 2018-01-04 DIAGNOSIS — Z85828 Personal history of other malignant neoplasm of skin: Secondary | ICD-10-CM | POA: Diagnosis not present

## 2018-01-04 DIAGNOSIS — F10939 Alcohol use, unspecified with withdrawal, unspecified: Secondary | ICD-10-CM | POA: Diagnosis present

## 2018-01-04 DIAGNOSIS — E876 Hypokalemia: Secondary | ICD-10-CM | POA: Diagnosis present

## 2018-01-04 DIAGNOSIS — R0602 Shortness of breath: Secondary | ICD-10-CM

## 2018-01-04 DIAGNOSIS — Z823 Family history of stroke: Secondary | ICD-10-CM

## 2018-01-04 DIAGNOSIS — F1010 Alcohol abuse, uncomplicated: Secondary | ICD-10-CM

## 2018-01-04 DIAGNOSIS — I10 Essential (primary) hypertension: Secondary | ICD-10-CM | POA: Diagnosis present

## 2018-01-04 DIAGNOSIS — Z8249 Family history of ischemic heart disease and other diseases of the circulatory system: Secondary | ICD-10-CM

## 2018-01-04 DIAGNOSIS — F10239 Alcohol dependence with withdrawal, unspecified: Secondary | ICD-10-CM | POA: Diagnosis present

## 2018-01-04 DIAGNOSIS — D649 Anemia, unspecified: Secondary | ICD-10-CM | POA: Diagnosis present

## 2018-01-04 DIAGNOSIS — F10232 Alcohol dependence with withdrawal with perceptual disturbance: Secondary | ICD-10-CM

## 2018-01-04 DIAGNOSIS — E871 Hypo-osmolality and hyponatremia: Secondary | ICD-10-CM | POA: Diagnosis present

## 2018-01-04 DIAGNOSIS — R441 Visual hallucinations: Secondary | ICD-10-CM | POA: Diagnosis present

## 2018-01-04 DIAGNOSIS — I2699 Other pulmonary embolism without acute cor pulmonale: Secondary | ICD-10-CM | POA: Diagnosis present

## 2018-01-04 DIAGNOSIS — M5441 Lumbago with sciatica, right side: Secondary | ICD-10-CM

## 2018-01-04 DIAGNOSIS — G5621 Lesion of ulnar nerve, right upper limb: Secondary | ICD-10-CM

## 2018-01-04 DIAGNOSIS — H04129 Dry eye syndrome of unspecified lacrimal gland: Secondary | ICD-10-CM

## 2018-01-04 DIAGNOSIS — F411 Generalized anxiety disorder: Secondary | ICD-10-CM

## 2018-01-04 DIAGNOSIS — D539 Nutritional anemia, unspecified: Secondary | ICD-10-CM

## 2018-01-04 DIAGNOSIS — F10931 Alcohol use, unspecified with withdrawal delirium: Secondary | ICD-10-CM | POA: Diagnosis present

## 2018-01-04 DIAGNOSIS — R21 Rash and other nonspecific skin eruption: Secondary | ICD-10-CM

## 2018-01-04 HISTORY — DX: Alcohol dependence, uncomplicated: F10.20

## 2018-01-04 LAB — CBC WITH DIFFERENTIAL/PLATELET
BASOS PCT: 0 %
Basophils Absolute: 0 10*3/uL (ref 0.0–0.1)
Eosinophils Absolute: 0 10*3/uL (ref 0.0–0.7)
Eosinophils Relative: 0 %
HEMATOCRIT: 33.8 % — AB (ref 39.0–52.0)
HEMOGLOBIN: 12 g/dL — AB (ref 13.0–17.0)
LYMPHS ABS: 0.9 10*3/uL (ref 0.7–4.0)
Lymphocytes Relative: 13 %
MCH: 35 pg — AB (ref 26.0–34.0)
MCHC: 35.5 g/dL (ref 30.0–36.0)
MCV: 98.5 fL (ref 78.0–100.0)
MONO ABS: 1.1 10*3/uL — AB (ref 0.1–1.0)
MONOS PCT: 16 %
NEUTROS PCT: 71 %
Neutro Abs: 4.8 10*3/uL (ref 1.7–7.7)
Platelets: 195 10*3/uL (ref 150–400)
RBC: 3.43 MIL/uL — ABNORMAL LOW (ref 4.22–5.81)
RDW: 14 % (ref 11.5–15.5)
WBC: 6.8 10*3/uL (ref 4.0–10.5)

## 2018-01-04 LAB — COMPREHENSIVE METABOLIC PANEL
ALK PHOS: 61 U/L (ref 38–126)
ALT: 62 U/L (ref 17–63)
ANION GAP: 13 (ref 5–15)
AST: 65 U/L — ABNORMAL HIGH (ref 15–41)
Albumin: 3.6 g/dL (ref 3.5–5.0)
BILIRUBIN TOTAL: 1.2 mg/dL (ref 0.3–1.2)
BUN: 7 mg/dL (ref 6–20)
CALCIUM: 8.5 mg/dL — AB (ref 8.9–10.3)
CO2: 20 mmol/L — ABNORMAL LOW (ref 22–32)
Chloride: 94 mmol/L — ABNORMAL LOW (ref 101–111)
Creatinine, Ser: 1.2 mg/dL (ref 0.61–1.24)
GFR calc non Af Amer: >60 mL/min (ref >60)
Glucose, Bld: 104 mg/dL — ABNORMAL HIGH (ref 65–99)
POTASSIUM: 3.1 mmol/L — AB (ref 3.5–5.1)
Sodium: 127 mmol/L — ABNORMAL LOW (ref 135–145)
TOTAL PROTEIN: 6.8 g/dL (ref 6.5–8.1)

## 2018-01-04 LAB — ETHANOL

## 2018-01-04 LAB — MAGNESIUM: MAGNESIUM: 1.6 mg/dL — AB (ref 1.7–2.4)

## 2018-01-04 LAB — LIPASE, BLOOD: LIPASE: 31 U/L (ref 11–51)

## 2018-01-04 MED ORDER — LORAZEPAM 2 MG/ML IJ SOLN: 1.0000 mg | INTRAMUSCULAR | Status: DC | PRN

## 2018-01-04 MED ORDER — POTASSIUM CHLORIDE CRYS ER 20 MEQ PO TBCR
40.0000 meq | EXTENDED_RELEASE_TABLET | Freq: Once | ORAL | Status: AC
  Administered 2018-01-04: 40 meq via ORAL
  Filled 2018-01-04: qty 2

## 2018-01-04 MED ORDER — SODIUM CHLORIDE 0.9 % IV BOLUS
1000.0000 mL | Freq: Once | INTRAVENOUS | Status: AC
  Administered 2018-01-04: 1000 mL via INTRAVENOUS

## 2018-01-04 MED ORDER — LORAZEPAM 2 MG/ML IJ SOLN
2.0000 mg | Freq: Once | INTRAMUSCULAR | Status: AC
  Administered 2018-01-04: 2 mg via INTRAVENOUS
  Filled 2018-01-04: qty 1

## 2018-01-04 NOTE — ED Notes (Signed)
Went to give patient his prescribed POTASSIUM CHLORIDE medication, patient was standing up, close to the sink. Patients IV was discontinued and the site was bleeding. Immediately applied pressure to the IV site and called for assistance. Marissa Calamity, RN came to bedside. While trying to clean patient and the surrounding area, patient stated he needed to have a bowel movement. Anderson Malta and myself assisted patient to the bathroom while using a wheelchair. When patient returned from restroom, administered prescribed medication. Placed patient on the stretcher that has been cleaned with clean linen. Patient was placed back on cardiac monitor, pulse ox, and automatic blood pressure. Mortimer Fries RN and Dr. Sherry Ruffing is aware of patients condition and situation.

## 2018-01-04 NOTE — ED Notes (Signed)
Dr T notified that pt pulled out IV site states as long as pt is taking po fluids well we can hold on new site.
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
## 2018-01-04 NOTE — ED Provider Notes (Signed)
[facility] DEPT Provider Note   CSN: [id number] Arrival date & time: 01/04/18  1621     History   Chief Complaint Chief Complaint  Patient presents with  . Withdrawal    HPI Anthony [name] is a 62 y.o. male.  The history is provided by the patient and medical records.  Alcohol Problem  This is a recurrent problem. The current episode started more than 2 days ago. The problem occurs constantly. The problem has been gradually worsening. Pertinent negatives include no chest pain, no abdominal pain, no headaches and no shortness of breath. Nothing aggravates the symptoms. Nothing relieves the symptoms. He has tried nothing for the symptoms. The treatment provided no relief.    Past Medical History:  Diagnosis Date  . Alcoholism ([place])   . Hydrocele    Surgically corrected  . Hypertension   . Lumbar compression fracture ([place])   . Skin cancer    exciced 2017    Patient Active Problem List   Diagnosis Date Noted  . Lightheadedness 11/28/2017  . Pancytopenia ([place])   . Alcohol withdrawal ([place]) 09/01/2017  . Delirium tremens ([place]) 09/01/2017  . Altered mental status   . Dyspnea 11/27/2016  . Actinic keratosis 11/27/2016  . Macrocytic anemia 11/23/2016  . Dry eye 11/23/2016  . Right pulmonary embolus ([place]) 11/02/2016  . Hiatal hernia 09/14/2016  . Skin cancer 08/13/2016  . Toe pain 07/07/2013  . Poor social situation 06/09/2013  . Tinea versicolor 06/08/2013  . Trigger finger, acquired 11/18/2012  . 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 HTN (hypertension) 04/11/2012  . Alcohol abuse 09/16/2006  . Chronic low back pain 09/16/2006    Past Surgical History:  Procedure Laterality Date  . HIATAL HERNIA REPAIR  2016  . HYDROCELE EXCISION / REPAIR  2010  . SKIN CANCER EXCISION Left    Excised 2017        Home Medications    Prior to Admission medications   Medication Sig Start  Date End Date Taking? Authorizing Provider  acetaminophen (TYLENOL) 650 MG CR tablet TAKE 1 TABLET (650 MG TOTAL) BY MOUTH EVERY 8 (EIGHT) HOURS AS NEEDED FOR PAIN. 05/24/17   Mercy Riding, MD  cetaphil (CETAPHIL) lotion Apply topically daily. 09/06/17   Bufford Lope, DO  cetirizine (ZYRTEC) 10 MG tablet Take 1 tablet (10 mg total) by mouth daily. 09/22/17   Mercy Riding, MD  cyanocobalamin ([employer] VITAMIN B12) 1000 MCG tablet Take 1 tablet (1,000 mcg total) by mouth daily. 07/28/17   Mercy Riding, MD  ferrous sulfate 325 (65 FE) MG tablet Take 325 mg by mouth 2 (two) times a week.    [provider]  folic acid (FOLVITE) 1 MG tablet Take 1 tablet (1 mg total) by mouth 2 (two) times a week. 11/23/16   Mercy Riding, MD  hydrochlorothiazide (HYDRODIURIL) 25 MG tablet TAKE 1 TABLET BY MOUTH EVERY DAY 12/31/17   Mercy Riding, MD  hydroxypropyl methylcellulose / hypromellose (ISOPTO TEARS / GONIOVISC) 2.5 % ophthalmic solution Place 1 drop into both eyes as needed for dry eyes.    [provider]  methocarbamol (ROBAXIN) 750 MG tablet TAKE 1 TABLET (750 MG TOTAL) BY MOUTH EVERY 8 (EIGHT) HOURS AS NEEDED. 11/09/17   Mercy Riding, MD  Multiple Vitamin (MULTIVITAMIN WITH MINERALS) TABS tablet Take 1 tablet by mouth 2 (two) times a week.    [provider]  Multiple Vitamins-Minerals (PRESERVISION AREDS 2) CAPS Take 2 capsules by mouth 2 (two) times a week. Patient not taking: Reported on 09/01/2017 11/23/16   Mercy Riding, MD  naproxen (NAPROSYN) 500 MG tablet Take 1 tablet (500 mg total) by mouth 2 (two) times daily with a meal. 11/26/17   Mercy Riding, MD  sertraline (ZOLOFT) 50 MG tablet Take 1 tablet (50 mg total) by mouth daily. 09/22/17   Mercy Riding, MD  thiamine (VITAMIN B-1) 100 MG tablet Take 1 tablet (100 mg total) by mouth 2 (two) times a week. 11/23/16   Mercy Riding, MD  vitamin B-6 (PYRIDOXINE) 25 MG tablet Take 1 tablet (25 mg total) by mouth daily. 11/26/17   Mercy Riding,  MD    Family History Family History  Problem Relation Age of Onset  . Heart attack Father        died at 61 years  . Dementia Father   . Stroke Father   . Cancer Sister     Social History Social History   Tobacco Use  . Smoking status: Never Smoker  . Smokeless tobacco: Never Used  Substance Use Topics  . Alcohol use: Yes    Alcohol/week: 50.4 oz    Types: 84 Cans of beer per week  . Drug use: Yes    Types: Marijuana    Comment: occasional     Allergies   Patient has no known allergies.   Review of Systems Review of Systems  Constitutional: Positive for diaphoresis. Negative for chills, fatigue and fever.  HENT: Negative for congestion.   Eyes: Negative for visual disturbance.  Respiratory: Negative for cough, chest tightness, shortness of breath and wheezing.   Cardiovascular: Negative for chest pain, palpitations and leg swelling.  Gastrointestinal: Positive for diarrhea and nausea. Negative for abdominal pain, constipation and vomiting.  Musculoskeletal: Negative for back pain, neck pain and neck stiffness.  Neurological: Negative for light-headedness, numbness and headaches.  All other systems reviewed and are negative.    Physical Exam Updated Vital Signs BP (!) 140/99   Pulse (!) 104   SpO2 98%   Physical Exam  Constitutional: He is oriented to person, place, and time. He appears well-developed. No distress.  HENT:  Head: Normocephalic.  Nose: Nose normal.  Mouth/Throat: Oropharynx is clear and moist. No oropharyngeal exudate.  Eyes: Pupils are equal, round, and reactive to light. Conjunctivae are normal.  Neck: Normal range of motion. Neck supple.  Cardiovascular: Tachycardia present.  No murmur heard. Pulmonary/Chest: Effort normal. No respiratory distress. He has no wheezes. He exhibits no tenderness.  Abdominal: Soft. He exhibits no distension. There is no tenderness.  Musculoskeletal: Normal range of motion. He exhibits no edema or  tenderness.  Lymphadenopathy:    He has no cervical adenopathy.  Neurological: He is alert and oriented to person, place, and time. He is not disoriented. He displays tremor. No cranial nerve deficit or sensory deficit. He displays no seizure activity. Coordination abnormal.  Skin: Capillary refill takes less than 2 seconds. He is diaphoretic. No erythema. No pallor.  Psychiatric: He is actively hallucinating.  Nursing note and vitals reviewed.    ED Treatments / Results  Labs (all labs ordered are listed, but only abnormal results are displayed) Labs Reviewed  CBC WITH DIFFERENTIAL/PLATELET - Abnormal; Notable for the following components:      Result Value   RBC 3.43 (*)    Hemoglobin 12.0 (*)    HCT 33.8 (*)  MCH 35.0 (*)    Monocytes Absolute 1.1 (*)    All other components within normal limits  COMPREHENSIVE METABOLIC PANEL - Abnormal; Notable for the following components:   Sodium 127 (*)    Potassium 3.1 (*)    Chloride 94 (*)    CO2 20 (*)    Glucose, Bld 104 (*)    Calcium 8.5 (*)    AST 65 (*)    All other components within normal limits  MAGNESIUM - Abnormal; Notable for the following components:   Magnesium 1.6 (*)    All other components within normal limits  BASIC METABOLIC PANEL - Abnormal; Notable for the following components:   Sodium 130 (*)    Potassium 3.3 (*)    Chloride 99 (*)    Calcium 8.2 (*)    All other components within normal limits  MRSA PCR SCREENING  LIPASE, BLOOD  ETHANOL  OSMOLALITY, URINE  SODIUM, URINE, RANDOM  BASIC METABOLIC PANEL    EKG EKG Interpretation  Date/Time:  Tuesday January 04 2018 17:44:35 EDT Ventricular Rate:  101 PR Interval:    QRS Duration: 81 QT Interval:  387 QTC Calculation: 502 R Axis:   54 Text Interpretation:  Sinus tachycardia Multiple premature complexes, vent & supraven Minimal ST elevation, inferior leads Prolonged QT interval Baseline wander in lead(s) III when compared to prior, longer QTc and  PVC present.  No STEMI Confirmed by Antony Blackbird [phone]) on 01/04/2018 6:08:22 PM   Radiology Dg Chest 2 View  Result Date: 01/04/2018 CLINICAL DATA:  Shortness of breath today.  History of hypertension. EXAM: CHEST - 2 VIEW COMPARISON:  11/18/2017 FINDINGS: The heart size and mediastinal contours are within normal limits. Both lungs are clear. The visualized skeletal structures are unremarkable. Surgical clips in the left upper quadrant. IMPRESSION: No active cardiopulmonary disease. Electronically Signed   By: Lucienne Capers M.D.   On: 01/04/2018 23:13    Procedures Procedures (including critical care time)  Medications Ordered in ED Medications  sodium chloride 0.9 % bolus 1,000 mL (0 mLs Intravenous Stopped 01/04/18 1915)  LORazepam (ATIVAN) injection 2 mg (2 mg Intravenous Given 01/04/18 1739)  potassium chloride SA (K-DUR,KLOR-CON) CR tablet 40 mEq (40 mEq Oral Given 01/04/18 1920)     Initial Impression / Assessment and Plan / ED Course  I have reviewed the triage vital signs and the nursing notes.  Pertinent labs & imaging results that were available during my care of the patient were reviewed by me and considered in my medical decision making (see chart for details).     Anthony [name] is a 62 y.o. male with past medical history significant for alcohol abuse and hypertension who presents with concern for alcohol withdrawal.  Patient says that he typically drinks 12 or so beers a day as well as liquor.  He says his last drink was Saturday (3 days ago) as he was arrested over the weekend.  He says that he has been having visual hallucinations seeing the blocks and the walls change shape, nausea, tremors, shakiness, sweating, and anxiety.  He says that he does not think he has had alcohol withdrawal seizures in the past but he is uncertain.  He denies any headache, chest pain, back pain, or abdominal pain at this time.  He does report some chronic diarrhea.  He denies any urinary  symptoms.  He says is not had anything to eat today.  He denies any trauma.    On exam, patient is  tachycardic and tremulous.  Patient is diaphoretic.  Patient's lungs are clear and chest is nontender.  Abdomen is nontender.  Patient has shaking with finger-nose-finger testing and pupils are reactive bilaterally.  No back tenderness.  Patient's triage CIWA score is a 25.  Clinically I am concerned about alcohol withdrawal.  Patient will be given Ativan and fluids.  He will have screening laboratory testing performed.  Next  Due to patient's withdrawals, he will likely need admission for further management.  9:07 PM After Ativan, CIWA score has improved to 10 however patient still feels anxious and tremulous.  Patient's tachycardia has slightly improved as well.  Patient was found to have some hyponatremia, hypokalemia which was replaced orally in the ED.  Given the patient's continued withdrawal symptoms and his CIWA of 25 on arrival and him having intermittent visual hallucinations, patient will be called for admission for alcohol withdrawal monitoring.   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 Occasional wrong-word or sound-a-like substitutions may have occurred due to the inherent limitations of voice recognition software.      Renell Allum, Gwenyth Allegra, MD 01/05/18 0120
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
## 2018-01-05 ENCOUNTER — Encounter (HOSPITAL_COMMUNITY): Payer: Self-pay | Admitting: *Deleted

## 2018-01-05 DIAGNOSIS — E876 Hypokalemia: Secondary | ICD-10-CM

## 2018-01-05 DIAGNOSIS — I2699 Other pulmonary embolism without acute cor pulmonale: Secondary | ICD-10-CM

## 2018-01-05 DIAGNOSIS — F10231 Alcohol dependence with withdrawal delirium: Secondary | ICD-10-CM

## 2018-01-05 DIAGNOSIS — F10232 Alcohol dependence with withdrawal with perceptual disturbance: Principal | ICD-10-CM

## 2018-01-05 LAB — BASIC METABOLIC PANEL
Anion gap: 9 (ref 5–15)
Anion gap: 9 (ref 5–15)
BUN: 7 mg/dL (ref 6–20)
BUN: 8 mg/dL (ref 6–20)
CALCIUM: 8.1 mg/dL — AB (ref 8.9–10.3)
CHLORIDE: 99 mmol/L — AB (ref 101–111)
CHLORIDE: 99 mmol/L — AB (ref 101–111)
CO2: 22 mmol/L (ref 22–32)
CO2: 23 mmol/L (ref 22–32)
CREATININE: 1 mg/dL (ref 0.61–1.24)
CREATININE: 1.14 mg/dL (ref 0.61–1.24)
Calcium: 8.2 mg/dL — ABNORMAL LOW (ref 8.9–10.3)
GFR calc non Af Amer: >60 mL/min (ref >60)
GLUCOSE: 89 mg/dL (ref 65–99)
Glucose, Bld: 91 mg/dL (ref 65–99)
POTASSIUM: 3.3 mmol/L — AB (ref 3.5–5.1)
Potassium: 3.6 mmol/L (ref 3.5–5.1)
SODIUM: 130 mmol/L — AB (ref 135–145)
Sodium: 131 mmol/L — ABNORMAL LOW (ref 135–145)

## 2018-01-05 LAB — OSMOLALITY, URINE: OSMOLALITY UR: 168 mosm/kg — AB (ref 300–900)

## 2018-01-05 LAB — SODIUM, URINE, RANDOM: Sodium, Ur: 27 mmol/L

## 2018-01-05 LAB — MRSA PCR SCREENING: MRSA by PCR: NEGATIVE

## 2018-01-05 LAB — MAGNESIUM: MAGNESIUM: 1.1 mg/dL — AB (ref 1.7–2.4)

## 2018-01-05 MED ORDER — ONDANSETRON HCL 4 MG PO TABS: 4.0000 mg | ORAL_TABLET | Freq: Four times a day (QID) | ORAL | Status: DC | PRN

## 2018-01-05 MED ORDER — FOLIC ACID 1 MG PO TABS
1.0000 mg | ORAL_TABLET | Freq: Every day | ORAL | Status: DC
  Administered 2018-01-05 – 2018-01-07 (×3): 1 mg via ORAL
  Filled 2018-01-05 (×3): qty 1

## 2018-01-05 MED ORDER — POTASSIUM CHLORIDE IN NACL 40-0.9 MEQ/L-% IV SOLN
INTRAVENOUS | Status: AC
  Administered 2018-01-05: 100 mL/h via INTRAVENOUS
  Filled 2018-01-05: qty 1000

## 2018-01-05 MED ORDER — ENOXAPARIN SODIUM 40 MG/0.4ML ~~LOC~~ SOLN
40.0000 mg | Freq: Every day | SUBCUTANEOUS | Status: DC
  Administered 2018-01-05 (×2): 40 mg via SUBCUTANEOUS
  Filled 2018-01-05 (×2): qty 0.4

## 2018-01-05 MED ORDER — LORAZEPAM 2 MG/ML IJ SOLN: 1.0000 mg | Freq: Four times a day (QID) | INTRAMUSCULAR | Status: DC | PRN

## 2018-01-05 MED ORDER — LORAZEPAM 1 MG PO TABS: 1.0000 mg | ORAL_TABLET | Freq: Four times a day (QID) | ORAL | Status: DC | PRN

## 2018-01-05 MED ORDER — METHOCARBAMOL 500 MG PO TABS
750.0000 mg | ORAL_TABLET | Freq: Three times a day (TID) | ORAL | Status: DC | PRN
  Administered 2018-01-05 – 2018-01-07 (×8): 750 mg via ORAL
  Filled 2018-01-05 (×8): qty 2

## 2018-01-05 MED ORDER — MAGNESIUM SULFATE 2 GM/50ML IV SOLN
2.0000 g | Freq: Once | INTRAVENOUS | Status: AC
  Administered 2018-01-05: 2 g via INTRAVENOUS
  Filled 2018-01-05: qty 50

## 2018-01-05 MED ORDER — POTASSIUM CHLORIDE CRYS ER 20 MEQ PO TBCR
40.0000 meq | EXTENDED_RELEASE_TABLET | Freq: Once | ORAL | Status: AC
  Administered 2018-01-05: 40 meq via ORAL
  Filled 2018-01-05: qty 2

## 2018-01-05 MED ORDER — THIAMINE HCL 100 MG/ML IJ SOLN: 100.0000 mg | Freq: Every day | INTRAMUSCULAR | Status: DC

## 2018-01-05 MED ORDER — LORAZEPAM 2 MG/ML IJ SOLN
0.0000 mg | Freq: Four times a day (QID) | INTRAMUSCULAR | Status: AC
  Administered 2018-01-05: 1 mg via INTRAVENOUS
  Filled 2018-01-05: qty 1

## 2018-01-05 MED ORDER — ADULT MULTIVITAMIN W/MINERALS CH
1.0000 | ORAL_TABLET | Freq: Every day | ORAL | Status: DC
  Administered 2018-01-05 – 2018-01-07 (×3): 1 via ORAL
  Filled 2018-01-05 (×3): qty 1

## 2018-01-05 MED ORDER — VITAMIN B-1 100 MG PO TABS
100.0000 mg | ORAL_TABLET | Freq: Every day | ORAL | Status: DC
  Administered 2018-01-05 – 2018-01-07 (×3): 100 mg via ORAL
  Filled 2018-01-05 (×3): qty 1

## 2018-01-05 MED ORDER — ONDANSETRON HCL 4 MG/2ML IJ SOLN: 4.0000 mg | Freq: Four times a day (QID) | INTRAMUSCULAR | Status: DC | PRN

## 2018-01-05 MED ORDER — SERTRALINE HCL 50 MG PO TABS
50.0000 mg | ORAL_TABLET | Freq: Every day | ORAL | Status: DC
  Administered 2018-01-05 – 2018-01-07 (×3): 50 mg via ORAL
  Filled 2018-01-05 (×3): qty 1

## 2018-01-05 MED ORDER — ACETAMINOPHEN 325 MG PO TABS
650.0000 mg | ORAL_TABLET | Freq: Three times a day (TID) | ORAL | Status: DC | PRN
  Administered 2018-01-05 – 2018-01-07 (×7): 650 mg via ORAL
  Filled 2018-01-05 (×7): qty 2

## 2018-01-05 MED ORDER — LORAZEPAM 2 MG/ML IJ SOLN: 0.0000 mg | Freq: Two times a day (BID) | INTRAMUSCULAR | Status: DC

## 2018-01-05 NOTE — ED Notes (Signed)
ED TO INPATIENT HANDOFF REPORT  Name/Age/Gender Anthony [name] 63 y.o. 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) [F10.232]  Level of Care/Admitting Diagnosis ED Disposition    ED Disposition Condition [facility] Area: [place] [[id number]]  Level of Care: Stepdown [14]  Admit to SDU based on following criteria: Other see comments  Comments: Alcohol withdrawal  Diagnosis: Alcohol withdrawal ([place]) [291.81.ICD-9-CM]  Admitting Physician: Bethena Roys [[id number]]  Attending Physician: Bethena Roys [phone]  Estimated length of stay: past midnight tomorrow  Certification:: I certify this patient will need inpatient services for at least 2 midnights  PT Class (Do Not Modify): Inpatient [101]  PT Acc Code (Do Not Modify): Private [1]       Medical History Past Medical History:  Diagnosis Date  . Alcoholism ([place])   . Hydrocele    Surgically corrected  . Hypertension   . Lumbar compression fracture ([place])   . Skin cancer    exciced 2017    Allergies No Known Allergies  IV Location/Drains/Wounds Patient Lines/Drains/Airways Status   Active Line/Drains/Airways    None          Labs/Imaging Results for orders placed or performed during the hospital encounter of 01/04/18 (from the past 48 hour(s))  CBC with Differential     Status: Abnormal   Collection  Time: 01/04/18  5:39 PM  Result Value Ref Range   WBC 6.8 4.0 - 10.5 K/uL   RBC 3.43 (L) 4.22 - 5.81 MIL/uL   Hemoglobin 12.0 (L) 13.0 - 17.0 g/dL   HCT 33.8 (L) 39.0 - 52.0 %   MCV 98.5 78.0 - 100.0 fL   MCH 35.0 (H) 26.0 - 34.0 pg   MCHC 35.5 30.0 - 36.0 g/dL   RDW 14.0 11.5 - 15.5 %   Platelets 195 150 - 400 K/uL   Neutrophils Relative % 71 %   Neutro Abs 4.8 1.7 - 7.7 K/uL   Lymphocytes Relative 13 %   Lymphs Abs 0.9 0.7 - 4.0 K/uL   Monocytes Relative 16 %   Monocytes Absolute 1.1 (H) 0.1 - 1.0 K/uL   Eosinophils Relative 0 %   Eosinophils Absolute 0.0 0.0 - 0.7 K/uL   Basophils Relative 0 %   Basophils Absolute 0.0 0.0 - 0.1 K/uL    Comment: Performed at [facility], [place] [address] [place], [place] [address]  Comprehensive metabolic panel     Status: Abnormal   Collection Time: 01/04/18  5:39 PM  Result Value Ref Range   Sodium 127 (L) 135 - 145 mmol/L   Potassium 3.1 (L) 3.5 - 5.1 mmol/L   Chloride 94 (L) 101 - 111 mmol/L   CO2 20 (L) 22 - 32 mmol/L   Glucose, Bld 104 (  H) 65 - 99 mg/dL   BUN 7 6 - 20 mg/dL   Creatinine, Ser 1.20 0.61 - 1.24 mg/dL   Calcium 8.5 (L) 8.9 - 10.3 mg/dL   Total Protein 6.8 6.5 - 8.1 g/dL   Albumin 3.6 3.5 - 5.0 g/dL   AST 65 (H) 15 - 41 U/L   ALT 62 17 - 63 U/L   Alkaline Phosphatase 61 38 - 126 U/L   Total Bilirubin 1.2 0.3 - 1.2 mg/dL   GFR calc non Af Amer >60 >60 mL/min   GFR calc Af Amer >60 >60 mL/min    Comment: (NOTE) The eGFR has been calculated using the CKD EPI equation. This calculation has not been validated in all clinical situations. eGFR's persistently <60 mL/min signify possible Chronic Kidney Disease.    Anion gap 13 5 - 15    Comment: Performed at [facility], [address] [place], [place] [address]  Lipase, blood     Status: None   Collection Time: 01/04/18  5:39 PM  Result Value Ref Range   Lipase 31 11 - 51 U/L    Comment: Performed at [facility], [address] [place], [place] [address]  Ethanol     Status: None   Collection Time: 01/04/18  9:30 PM  Result Value Ref Range   Alcohol, Ethyl (B) <10 <10 mg/dL    Comment: (NOTE) Lowest detectable limit for serum alcohol is 10 mg/dL. For medical purposes only. Performed at [facility], [address] [place], [place] [address]   Magnesium     Status: Abnormal   Collection Time: 01/04/18 10:00 PM  Result Value Ref Range   Magnesium 1.6 (L) 1.7 - 2.4 mg/dL    Comment: Performed at [facility], [address] [place], [place] [address]   Dg Chest 2 View  Result Date: 01/04/2018 CLINICAL DATA:  Shortness of breath today.  History of hypertension. EXAM: CHEST - 2 VIEW COMPARISON:  11/18/2017 FINDINGS: The heart size and mediastinal contours are within normal limits. Both lungs are clear. The visualized skeletal structures are unremarkable. Surgical clips in the left upper quadrant. IMPRESSION: No active cardiopulmonary disease. Electronically Signed   By: William  Stevens M.D.   On: 01/04/2018 23:13    Pending Labs Unresulted Labs (From admission, onward)   Start     Ordered   01/05/18 0500  Basic metabolic panel  Tomorrow morning,   R     01/05/18 0050   01/04/18 2359  Basic metabolic panel  Once,   R     01/04/18 2233   01/04/18 2126  Sodium, urine, random  Once,   R     01/04/18 2125   01/04/18 2125  Osmolality, urine  Once,   R     01/04/18 2125      Vitals/Pain Today's Vitals   01/04/18 2021 01/04/18 2100 01/04/18 2145 01/04/18 2230  BP: 126/88 131/89 107/70 120/76  Pulse: (!) 101 86 87 90  Resp:    20  Temp:      TempSrc:      SpO2:  99% 97% 96%  PainSc:        Isolation Precautions No active isolations  Medications Medications  LORazepam (ATIVAN) tablet 1 mg (has no administration in time range)    Or  LORazepam (ATIVAN) injection 1 mg (has no administration in time range)  thiamine (VITAMIN B-1)  tablet 100 mg (has no administration in time   range)    Or  thiamine (B-1) injection 100 mg (has no administration in time range)  folic acid (FOLVITE) tablet 1 mg (has no administration in time range)  multivitamin with minerals tablet 1 tablet (has no administration in time range)  LORazepam (ATIVAN) injection 0-4 mg (has no administration in time range)    Followed by  LORazepam (ATIVAN) injection 0-4 mg (has no administration in time range)  enoxaparin (LOVENOX) injection 40 mg (has no administration in time range)  0.9 % NaCl with KCl 40 mEq / L  infusion (has no administration in time range)  ondansetron (ZOFRAN) tablet 4 mg (has no administration in time range)    Or  ondansetron (ZOFRAN) injection 4 mg (has no administration in time range)  methocarbamol (ROBAXIN) tablet 750 mg (has no administration in time range)  acetaminophen (TYLENOL) CR tablet 650 mg (has no administration in time range)  sertraline (ZOLOFT) tablet 50 mg (has no administration in time range)  LORazepam (ATIVAN) injection 1 mg (has no administration in time range)  sodium chloride 0.9 % bolus 1,000 mL (0 mLs Intravenous Stopped 01/04/18 1915)  LORazepam (ATIVAN) injection 2 mg (2 mg Intravenous Given 01/04/18 1739)  potassium chloride SA (K-DUR,KLOR-CON) CR tablet 40 mEq (40 mEq Oral Given 01/04/18 1920)    Mobility walks  
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
## 2018-01-05 NOTE — ED Notes (Signed)
ED TO INPATIENT HANDOFF REPORT  Name/Age/Gender Anthony [name] 62 y.o. 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) [F10.232]  Level of Care/Admitting Diagnosis ED Disposition    ED Disposition Condition [facility] Area: [place] [[id number]]  Level of Care: Stepdown [14]  Admit to SDU based on following criteria: Other see comments  Comments: Alcohol withdrawal  Diagnosis: Alcohol withdrawal ([place]) [291.81.ICD-9-CM]  Admitting Physician: Bethena Roys [[id number]]  Attending Physician: Bethena Roys [phone]  Estimated length of stay: past midnight tomorrow  Certification:: I certify this patient will need inpatient services for at least 2 midnights  PT Class (Do Not Modify): Inpatient [101]  PT Acc Code (Do Not Modify): Private [1]       Medical History Past Medical History:  Diagnosis Date  . Alcoholism ([place])   . Hydrocele    Surgically corrected  . Hypertension   . Lumbar compression fracture ([place])   . Skin cancer    exciced 2017    Allergies No Known Allergies  IV Location/Drains/Wounds Patient Lines/Drains/Airways Status   Active Line/Drains/Airways    None          Labs/Imaging Results for orders placed or performed during the hospital encounter of 01/04/18 (from the past 48 hour(s))  CBC with Differential     Status: Abnormal   Collection Time: 01/04/18  5:39 PM  Result Value Ref Range   WBC 6.8 4.0 - 10.5 K/uL   RBC 3.43 (L) 4.22 - 5.81 MIL/uL   Hemoglobin 12.0 (L) 13.0 - 17.0  g/dL   HCT 33.8 (L) 39.0 - 52.0 %   MCV 98.5 78.0 - 100.0 fL   MCH 35.0 (H) 26.0 - 34.0 pg   MCHC 35.5 30.0 - 36.0 g/dL   RDW 14.0 11.5 - 15.5 %   Platelets 195 150 - 400 K/uL   Neutrophils Relative % 71 %   Neutro Abs 4.8 1.7 - 7.7 K/uL   Lymphocytes Relative 13 %   Lymphs Abs 0.9 0.7 - 4.0 K/uL   Monocytes Relative 16 %   Monocytes Absolute 1.1 (H) 0.1 - 1.0 K/uL   Eosinophils Relative 0 %   Eosinophils Absolute 0.0 0.0 - 0.7 K/uL   Basophils Relative 0 %   Basophils Absolute 0.0 0.0 - 0.1 K/uL    Comment: Performed at [facility], [place] [address] [place], [place] [address]  Comprehensive metabolic panel     Status: Abnormal   Collection Time: 01/04/18  5:39 PM  Result Value Ref Range   Sodium 127 (L) 135 - 145 mmol/L   Potassium 3.1 (L) 3.5 - 5.1 mmol/L   Chloride 94 (L) 101 - 111 mmol/L   CO2 20 (L) 22 - 32 mmol/L   Glucose, Bld 104 (H) 65 - 99 mg/dL   BUN 7 6 - 20 mg/dL   Creatinine, Ser 1.20 0.61 - 1.24 mg/dL   Calcium 8.5 (L) 8.9 - 10.3 mg/dL   Total Protein 6.8 6.5 - 8.1 g/dL  Albumin 3.6 3.5 - 5.0 g/dL   AST 65 (H) 15 - 41 U/L   ALT 62 17 - 63 U/L   Alkaline Phosphatase 61 38 - 126 U/L   Total Bilirubin 1.2 0.3 - 1.2 mg/dL   GFR calc non Af Amer >60 >60 mL/min   GFR calc Af Amer >60 >60 mL/min    Comment: (NOTE) The eGFR has been calculated using the CKD EPI equation. This calculation has not been validated in all clinical situations. eGFR's persistently <60 mL/min signify possible Chronic Kidney Disease.    Anion gap 13 5 - 15    Comment: Performed at [facility], [place] [address] [place], [place] [address]  Lipase, blood     Status: None   Collection Time: 01/04/18  5:39 PM  Result Value Ref Range   Lipase 31 11 - 51 U/L    Comment: Performed at [facility], [place] [address], [place], [place] [address]  Ethanol     Status: None   Collection Time: 01/04/18  9:30 PM  Result Value Ref Range    Alcohol, Ethyl (B) <10 <10 mg/dL    Comment: (NOTE) Lowest detectable limit for serum alcohol is 10 mg/dL. For medical purposes only. Performed at [facility], [place] [address] [place], [place] [address]   Magnesium     Status: Abnormal   Collection Time: 01/04/18 10:00 PM  Result Value Ref Range   Magnesium 1.6 (L) 1.7 - 2.4 mg/dL    Comment: Performed at [facility], [place] [address] [place], [place] [address]   Dg Chest 2 View  Result Date: 01/04/2018 CLINICAL DATA:  Shortness of breath today.  History of hypertension. EXAM: CHEST - 2 VIEW COMPARISON:  11/18/2017 FINDINGS: The heart size and mediastinal contours are within normal limits. Both lungs are clear. The visualized skeletal structures are unremarkable. Surgical clips in the left upper quadrant. IMPRESSION: No active cardiopulmonary disease. Electronically Signed   By: Lucienne Capers M.D.   On: 01/04/2018 23:13    Pending Labs Unresulted Labs (From admission, onward)   Start     Ordered   01/04/18 3149  Basic metabolic panel  Once,   R     01/04/18 2233   01/04/18 2126  Sodium, urine, random  Once,   R     01/04/18 2125   01/04/18 2125  Osmolality, urine  Once,   R     01/04/18 2125   Signed and Held  Basic metabolic panel  Tomorrow morning,   R     Signed and Held      Vitals/Pain Today's Vitals   01/04/18 2021 01/04/18 2100 01/04/18 2145 01/04/18 2230  BP: 126/88 131/89 107/70 120/76  Pulse: (!) 101 86 87 90  Resp:    20  Temp:      TempSrc:      SpO2:  99% 97% 96%  PainSc:        Isolation Precautions No active isolations  Medications Medications  LORazepam (ATIVAN) injection 1 mg (has no administration in time range)  sodium chloride 0.9 % bolus 1,000 mL (0 mLs Intravenous Stopped 01/04/18 1915)  LORazepam (ATIVAN) injection 2 mg (2 mg Intravenous Given 01/04/18 1739)  potassium chloride SA (K-DUR,KLOR-CON) CR tablet 40 mEq (40 mEq Oral Given 01/04/18 1920)     Mobility ambulatory

## 2018-01-06 DIAGNOSIS — I10 Essential (primary) hypertension: Secondary | ICD-10-CM

## 2018-01-06 LAB — COMPREHENSIVE METABOLIC PANEL
ALBUMIN: 3.8 g/dL (ref 3.5–5.0)
ALT: 52 U/L (ref 17–63)
ANION GAP: 8 (ref 5–15)
AST: 46 U/L — AB (ref 15–41)
Alkaline Phosphatase: 64 U/L (ref 38–126)
BUN: 9 mg/dL (ref 6–20)
CHLORIDE: 110 mmol/L (ref 101–111)
CO2: 19 mmol/L — ABNORMAL LOW (ref 22–32)
Calcium: 8.6 mg/dL — ABNORMAL LOW (ref 8.9–10.3)
Creatinine, Ser: 0.83 mg/dL (ref 0.61–1.24)
GFR calc Af Amer: >60 mL/min (ref >60)
GFR calc non Af Amer: >60 mL/min (ref >60)
GLUCOSE: 97 mg/dL (ref 65–99)
Potassium: 4.3 mmol/L (ref 3.5–5.1)
SODIUM: 137 mmol/L (ref 135–145)
Total Bilirubin: 0.9 mg/dL (ref 0.3–1.2)
Total Protein: 7 g/dL (ref 6.5–8.1)

## 2018-01-06 LAB — CBC WITH DIFFERENTIAL/PLATELET
BASOS ABS: 0 10*3/uL (ref 0.0–0.1)
BASOS PCT: 1 %
EOS ABS: 0.2 10*3/uL (ref 0.0–0.7)
EOS PCT: 3 %
HCT: 36.7 % — ABNORMAL LOW (ref 39.0–52.0)
Hemoglobin: 12.6 g/dL — ABNORMAL LOW (ref 13.0–17.0)
Lymphocytes Relative: 31 %
Lymphs Abs: 1.6 10*3/uL (ref 0.7–4.0)
MCH: 34.7 pg — ABNORMAL HIGH (ref 26.0–34.0)
MCHC: 34.3 g/dL (ref 30.0–36.0)
MCV: 101.1 fL — ABNORMAL HIGH (ref 78.0–100.0)
MONOS PCT: 17 %
Monocytes Absolute: 0.9 10*3/uL (ref 0.1–1.0)
Neutro Abs: 2.6 10*3/uL (ref 1.7–7.7)
Neutrophils Relative %: 48 %
PLATELETS: 235 10*3/uL (ref 150–400)
RBC: 3.63 MIL/uL — ABNORMAL LOW (ref 4.22–5.81)
RDW: 14.5 % (ref 11.5–15.5)
WBC: 5.3 10*3/uL (ref 4.0–10.5)

## 2018-01-06 LAB — MAGNESIUM: Magnesium: 1.7 mg/dL (ref 1.7–2.4)

## 2018-01-06 LAB — IRON AND TIBC
IRON: 27 ug/dL — AB (ref 45–182)
SATURATION RATIOS: 8 % — AB (ref 17.9–39.5)
TIBC: 359 ug/dL (ref 250–450)
UIBC: 332 ug/dL

## 2018-01-06 LAB — RETICULOCYTES
RBC.: 3.63 MIL/uL — AB (ref 4.22–5.81)
RETIC COUNT ABSOLUTE: 43.6 10*3/uL (ref 19.0–186.0)
Retic Ct Pct: 1.2 % (ref 0.4–3.1)

## 2018-01-06 LAB — FERRITIN: Ferritin: 77 ng/mL (ref 24–336)

## 2018-01-06 LAB — PHOSPHORUS: Phosphorus: 2.8 mg/dL (ref 2.5–4.6)

## 2018-01-06 LAB — VITAMIN B12: VITAMIN B 12: 675 pg/mL (ref 180–914)

## 2018-01-06 LAB — FOLATE: Folate: 9.8 ng/mL (ref >5.9)

## 2018-01-06 MED ORDER — HYDRALAZINE HCL 20 MG/ML IJ SOLN
10.0000 mg | Freq: Four times a day (QID) | INTRAMUSCULAR | Status: DC | PRN
  Administered 2018-01-06: 10 mg via INTRAVENOUS
  Filled 2018-01-06: qty 1

## 2018-01-06 MED ORDER — MAGNESIUM SULFATE 2 GM/50ML IV SOLN
2.0000 g | Freq: Once | INTRAVENOUS | Status: AC
  Administered 2018-01-06: 2 g via INTRAVENOUS
  Filled 2018-01-06: qty 50

## 2018-01-06 NOTE — Progress Notes (Signed)
Pt transferred to the unit at 1640. Pt mental status is alert. Pt oriented to room, staff, and call bell. Skin is intact besides a scratch to knee. Agree with previous nurse full assessment charted in [facility]. Call bell within reach.
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
## 2018-01-07 LAB — COMPREHENSIVE METABOLIC PANEL
ALK PHOS: 54 U/L (ref 38–126)
ALT: 40 U/L (ref 17–63)
AST: 33 U/L (ref 15–41)
Albumin: 3.2 g/dL — ABNORMAL LOW (ref 3.5–5.0)
Anion gap: 7 (ref 5–15)
BILIRUBIN TOTAL: 0.7 mg/dL (ref 0.3–1.2)
BUN: 7 mg/dL (ref 6–20)
CALCIUM: 8.8 mg/dL — AB (ref 8.9–10.3)
CO2: 23 mmol/L (ref 22–32)
CREATININE: 0.71 mg/dL (ref 0.61–1.24)
Chloride: 106 mmol/L (ref 101–111)
Glucose, Bld: 107 mg/dL — ABNORMAL HIGH (ref 65–99)
Potassium: 3.8 mmol/L (ref 3.5–5.1)
Sodium: 136 mmol/L (ref 135–145)
TOTAL PROTEIN: 6.5 g/dL (ref 6.5–8.1)

## 2018-01-07 LAB — CBC WITH DIFFERENTIAL/PLATELET
BASOS ABS: 0 10*3/uL (ref 0.0–0.1)
Basophils Relative: 1 %
EOS PCT: 4 %
Eosinophils Absolute: 0.2 10*3/uL (ref 0.0–0.7)
HEMATOCRIT: 35.1 % — AB (ref 39.0–52.0)
Hemoglobin: 11.7 g/dL — ABNORMAL LOW (ref 13.0–17.0)
LYMPHS PCT: 36 %
Lymphs Abs: 1.6 10*3/uL (ref 0.7–4.0)
MCH: 34.1 pg — AB (ref 26.0–34.0)
MCHC: 33.3 g/dL (ref 30.0–36.0)
MCV: 102.3 fL — AB (ref 78.0–100.0)
Monocytes Absolute: 0.8 10*3/uL (ref 0.1–1.0)
Monocytes Relative: 19 %
NEUTROS ABS: 1.7 10*3/uL (ref 1.7–7.7)
Neutrophils Relative %: 40 %
PLATELETS: 241 10*3/uL (ref 150–400)
RBC: 3.43 MIL/uL — AB (ref 4.22–5.81)
RDW: 14.7 % (ref 11.5–15.5)
WBC: 4.4 10*3/uL (ref 4.0–10.5)

## 2018-01-07 LAB — MAGNESIUM: MAGNESIUM: 1.4 mg/dL — AB (ref 1.7–2.4)

## 2018-01-07 LAB — PHOSPHORUS: PHOSPHORUS: 3.4 mg/dL (ref 2.5–4.6)

## 2018-01-07 MED ORDER — CYANOCOBALAMIN 1000 MCG PO TABS: 1000.0000 ug | ORAL_TABLET | Freq: Every day | ORAL | 0 refills | Status: DC

## 2018-01-07 MED ORDER — AMLODIPINE BESYLATE 5 MG PO TABS: 5.0000 mg | ORAL_TABLET | Freq: Every day | ORAL | 0 refills | Status: DC

## 2018-01-07 MED ORDER — AMLODIPINE BESYLATE 5 MG PO TABS
5.0000 mg | ORAL_TABLET | Freq: Every day | ORAL | Status: DC
  Administered 2018-01-07: 5 mg via ORAL
  Filled 2018-01-07: qty 1

## 2018-01-07 MED ORDER — METHOCARBAMOL 750 MG PO TABS: 750.0000 mg | ORAL_TABLET | Freq: Three times a day (TID) | ORAL | 0 refills | Status: DC | PRN

## 2018-01-07 MED ORDER — VITAMIN B-1 100 MG PO TABS: 100.0000 mg | ORAL_TABLET | Freq: Every day | ORAL | 11 refills | Status: DC

## 2018-01-07 MED ORDER — MAGNESIUM SULFATE 4 GM/100ML IV SOLN
4.0000 g | Freq: Once | INTRAVENOUS | Status: AC
  Administered 2018-01-07: 4 g via INTRAVENOUS
  Filled 2018-01-07: qty 100

## 2018-01-07 MED ORDER — ADULT MULTIVITAMIN W/MINERALS CH: 1.0000 | ORAL_TABLET | ORAL | 0 refills | Status: DC

## 2018-01-07 MED ORDER — VITAMIN B-6 25 MG PO TABS: 25.0000 mg | ORAL_TABLET | Freq: Every day | ORAL | 0 refills | Status: DC

## 2018-01-07 MED ORDER — ACETAMINOPHEN ER 650 MG PO TBCR: 650.0000 mg | EXTENDED_RELEASE_TABLET | Freq: Three times a day (TID) | ORAL | 0 refills | Status: DC | PRN

## 2018-01-07 MED ORDER — SERTRALINE HCL 50 MG PO TABS: 50.0000 mg | ORAL_TABLET | Freq: Every day | ORAL | 0 refills | Status: DC

## 2018-01-07 MED ORDER — CETIRIZINE HCL 10 MG PO TABS: 10.0000 mg | ORAL_TABLET | Freq: Every day | ORAL | 11 refills | Status: DC

## 2018-01-07 MED ORDER — FOLIC ACID 1 MG PO TABS: 1.0000 mg | ORAL_TABLET | Freq: Every day | ORAL | 0 refills | Status: DC

## 2018-01-07 MED ORDER — HYPROMELLOSE (GONIOSCOPIC) 2.5 % OP SOLN: 1.0000 [drp] | OPHTHALMIC | 12 refills | Status: DC | PRN

## 2018-01-07 MED ORDER — FERROUS SULFATE 325 (65 FE) MG PO TABS: 325.0000 mg | ORAL_TABLET | ORAL | 0 refills | Status: DC

## 2018-01-07 MED ORDER — LOPERAMIDE HCL 2 MG PO CAPS: 2.0000 mg | ORAL_CAPSULE | ORAL | Status: DC | PRN
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
## 2018-01-07 NOTE — Care Management Note (Signed)
Case Management Note  Patient Details  Name: Anthony [name] MRN: [id number] Date of Birth: April 16, 1956  Subjective/Objective: Noted prior CM note addressing meds-patient to contact medicaid case worker. CSW already following for homelessness,& resources. No further CM needs.                   Action/Plan:d/c homeless shelter   Expected Discharge Date:  (unknown)               Expected Discharge Plan:  Homeless Shelter  In-House Referral:  Clinical Social Work  Discharge planning Services  CM Consult  Post Acute Care Choice:    Choice offered to:     DME Arranged:    DME Agency:     HH Arranged:    [place] Agency:     Status of Service:  Completed, signed off  If discussed at [employer], dates discussed:    Additional Comments:  Dessa Phi, RN 01/07/2018, 10:12 AM
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
## 2018-01-07 NOTE — Progress Notes (Signed)
Patient has discharged on 01/07/18. Discharge instruction including medication and application was given to patient. SW and CM are notified. Resource was given to patient.
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
## 2018-01-26 ENCOUNTER — Other Ambulatory Visit: Payer: Self-pay

## 2018-01-26 DIAGNOSIS — G8929 Other chronic pain: Secondary | ICD-10-CM

## 2018-01-26 DIAGNOSIS — M5441 Lumbago with sciatica, right side: Principal | ICD-10-CM

## 2018-01-26 MED ORDER — METHOCARBAMOL 750 MG PO TABS: 750.0000 mg | ORAL_TABLET | Freq: Three times a day (TID) | ORAL | 3 refills | Status: DC | PRN

## 2018-02-14 ENCOUNTER — Ambulatory Visit: Payer: Medicaid Other | Admitting: Student in an Organized Health Care Education/Training Program

## 2018-02-14 NOTE — Progress Notes (Deleted)
CMP, CBC, mag, phos [facility] admission for EtOH withdrawal, DTs

## 2018-03-18 ENCOUNTER — Other Ambulatory Visit: Payer: Self-pay

## 2018-03-18 DIAGNOSIS — G5621 Lesion of ulnar nerve, right upper limb: Secondary | ICD-10-CM

## 2018-03-18 DIAGNOSIS — F1010 Alcohol abuse, uncomplicated: Secondary | ICD-10-CM

## 2018-03-22 MED ORDER — VITAMIN B-1 100 MG PO TABS: 100.0000 mg | ORAL_TABLET | Freq: Every day | ORAL | 11 refills | Status: DC

## 2018-03-22 MED ORDER — VITAMIN B-6 25 MG PO TABS: 25.0000 mg | ORAL_TABLET | Freq: Every day | ORAL | 0 refills | Status: DC

## 2018-03-24 ENCOUNTER — Telehealth: Payer: Self-pay | Admitting: Family Medicine

## 2018-03-24 NOTE — Telephone Encounter (Signed)
Pt would like a referral for a dentist and one for his hearing. Pt has an appt for October, but was wanting to know if he could go ahead and get those referrals placed. Asked for a call back number but pt will call back to tell us his new phone number to call him back on once he remembers it. Please advise

## 2018-03-25 NOTE — Telephone Encounter (Signed)
We can mail him a list of dentists who accept Medicaid, but he will need to call and make an appointment once he receives the list.  Since he does not have any documentation of a hearing problem, I unfortunately cannot make a referral for his hearing.  He will need to discuss this at his upcoming visit with [place].

## 2018-04-22 ENCOUNTER — Ambulatory Visit: Payer: Self-pay | Admitting: Family Medicine

## 2018-04-22 ENCOUNTER — Encounter: Payer: Self-pay | Admitting: Family Medicine

## 2018-04-22 ENCOUNTER — Ambulatory Visit (INDEPENDENT_AMBULATORY_CARE_PROVIDER_SITE_OTHER): Payer: Medicaid Other | Admitting: Licensed Clinical Social Worker

## 2018-04-22 ENCOUNTER — Other Ambulatory Visit: Payer: Self-pay

## 2018-04-22 ENCOUNTER — Ambulatory Visit: Payer: Medicaid Other | Admitting: Family Medicine

## 2018-04-22 VITALS — BP 108/64 | HR 94 | Temp 98.1°F | Ht 70.0 in | Wt 173.6 lb

## 2018-04-22 DIAGNOSIS — K029 Dental caries, unspecified: Secondary | ICD-10-CM | POA: Diagnosis not present

## 2018-04-22 DIAGNOSIS — Z23 Encounter for immunization: Secondary | ICD-10-CM

## 2018-04-22 DIAGNOSIS — F1010 Alcohol abuse, uncomplicated: Secondary | ICD-10-CM

## 2018-04-22 DIAGNOSIS — B360 Pityriasis versicolor: Secondary | ICD-10-CM | POA: Diagnosis not present

## 2018-04-22 DIAGNOSIS — Z659 Problem related to unspecified psychosocial circumstances: Secondary | ICD-10-CM | POA: Diagnosis not present

## 2018-04-22 DIAGNOSIS — F411 Generalized anxiety disorder: Secondary | ICD-10-CM | POA: Diagnosis not present

## 2018-04-22 MED ORDER — SERTRALINE HCL 100 MG PO TABS: 50.0000 mg | ORAL_TABLET | Freq: Every day | ORAL | 11 refills | Status: DC

## 2018-04-22 MED ORDER — KETOCONAZOLE 2 % EX CREA: TOPICAL_CREAM | Freq: Two times a day (BID) | CUTANEOUS | 0 refills | Status: DC | PRN

## 2018-04-22 NOTE — Addendum Note (Signed)
Addended by: Casimer Lanius H on: 04/22/2018 05:08 PM   Modules accepted: Level of Service

## 2018-04-22 NOTE — Patient Instructions (Signed)
It was nice meeting you today Mr. Duffey!  I am increasing your dose of sertraline to 100 mg daily.  Please take this to help alleviate your anxiety.  I am refilling your cream for your arms.  You may have a combination of causes for your rash and itching, but this will take care of any fungal causes.  You can also use hydrocortisone cream (this is over the counter) to help reduce inflammation in your skin.  Please call the dentists on the list we're giving you to see when they can get you in to be seen.  If you have any questions or concerns, please feel free to call the clinic.   Be well,  Dr. Shan Levans
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
## 2018-04-22 NOTE — BH Specialist Note (Signed)
Type of Service: Integrated Behavioral Health Estimate Time:15 minutes Interpreter:No.   Anthony [name] is a 62 y.o. male referred by Dr. Shan Levans for resources for substance use.  Life & Social Patient is currently experiencing homelessness.  Recent life changes: Patient reports being at [facility] treatment facility a week ago and has graduated the program. Patients last drink was this morning for breakfast.  Issues discussed: Getting the patient linked to substance use services.   Intervention:community resources,  Assessment/Plan:  1.Patient will follow up with [facility] for substance use treatment. 2. [facility] intern will follow up with client in 5-7 business days to assess if client was connected to substance use treatment.    Warm Hand Off Completed.      Audry Riles, [place] intern Behavioral Health Clinician,  [place] [phone]

## 2018-04-22 NOTE — BH Specialist Note (Signed)
Integrated Behavioral Health Warm Handoff  MRN: [id number] Name: Anthony [name]  Number of [place] Clinician visits:: Session Start time: 4:15  Session End time: 4:30 Total time: 15 minutes    Type of Service: [place] Interpretor:No. Interpretor Name and Language: NA   Warm Hand Off Completed.     Patient verbally consented to meet with [facility] Consultant about presenting concerns. SUBJECTIVE: Anthony [name] is a 62 y.o. male referred by Dr. Shan Levans for assistance with obtaining inpatient substance treatment information.    Patient reports the following: being homeless, completed treatment program at [facility] last week, last drink was a beer today for breakfast,  interested in treatment that but does not want 12 step program due to religions aspect.  Patient also attended [facility] treatment several years ago and states this is not an option for him. [facility] intern discussed several available treatment options with patient.  ASSESSMENT: Patient is currently experiencing homeless and substance use.  He would like an inpatient substance treatment. He completed an inpatient program last week at [facility].  Patient may benefit from a long-term program that provides housing to address homeless and substance use. PLAN: 1. [facility] intern will F/U with patient in 5 to 7 days.  Patient provided intern phone number to call him 2. Referral(s): Substance Use program at [place] 3. Patient will call [place] next week   Joint Visit with Social work intern Casimer Lanius, [place]   [phone] 5:01 PM

## 2018-04-25 ENCOUNTER — Other Ambulatory Visit: Payer: Self-pay | Admitting: Family Medicine

## 2018-04-25 DIAGNOSIS — F411 Generalized anxiety disorder: Secondary | ICD-10-CM | POA: Insufficient documentation

## 2018-04-25 DIAGNOSIS — K047 Periapical abscess without sinus: Secondary | ICD-10-CM | POA: Insufficient documentation

## 2018-04-25 DIAGNOSIS — Z23 Encounter for immunization: Secondary | ICD-10-CM | POA: Insufficient documentation

## 2018-04-25 DIAGNOSIS — G8929 Other chronic pain: Secondary | ICD-10-CM

## 2018-04-25 DIAGNOSIS — M5441 Lumbago with sciatica, right side: Principal | ICD-10-CM

## 2018-04-25 HISTORY — DX: Periapical abscess without sinus: K04.7

## 2018-04-25 HISTORY — DX: Generalized anxiety disorder: F41.1

## 2018-04-25 NOTE — Assessment & Plan Note (Signed)
Divided warm handoff for social work to meet with patient.  Also encouraged patient to see me in about 2 weeks.

## 2018-04-25 NOTE — Assessment & Plan Note (Signed)
Advised patient to go to a dentist as soon as possible.  Gave patient a list of dentists who accept Medicaid patients.

## 2018-04-25 NOTE — Assessment & Plan Note (Signed)
Performed warm handoff with social work to provide patient resources for alcohol treatment.

## 2018-04-25 NOTE — Assessment & Plan Note (Signed)
Divided flu shot.

## 2018-04-25 NOTE — Assessment & Plan Note (Signed)
Increase patient's Zoloft dose from 50 to 100 mg.  Recommended that he see me in about 2 weeks to follow-up on this dose.

## 2018-04-25 NOTE — Assessment & Plan Note (Signed)
Will refill patient's ketoconazole cream.  Patient advised to apply this 1-2 times daily.

## 2018-04-27 ENCOUNTER — Other Ambulatory Visit: Payer: Self-pay

## 2018-04-27 ENCOUNTER — Telehealth: Payer: Self-pay | Admitting: Licensed Clinical Social Worker

## 2018-04-27 DIAGNOSIS — G5621 Lesion of ulnar nerve, right upper limb: Secondary | ICD-10-CM

## 2018-04-27 MED ORDER — VITAMIN B-6 25 MG PO TABS: 25.0000 mg | ORAL_TABLET | Freq: Every day | ORAL | 11 refills | Status: DC

## 2018-04-27 NOTE — Progress Notes (Signed)
Type of Service: Clinical Social Work  [place] intern phone call to patient regarding seeing if the patient got connected to resources provided by [facility] intern. Patient phone went directly to voicemail. No Voicemail box was set up at this time.   [facility] intern will follow up in 5-7 days to assess if the patient was able to get connected.  Audry Riles, [place] intern Behavioral Health Clinician,  [place] [phone]

## 2018-05-03 ENCOUNTER — Encounter (HOSPITAL_COMMUNITY): Payer: Self-pay | Admitting: Emergency Medicine

## 2018-05-03 ENCOUNTER — Telehealth: Payer: Self-pay | Admitting: Licensed Clinical Social Worker

## 2018-05-03 ENCOUNTER — Emergency Department (HOSPITAL_COMMUNITY)
Admission: EM | Admit: 2018-05-03 | Discharge: 2018-05-03 | Discharge status: Against Medical Advice | Payer: Medicaid Other | Attending: Emergency Medicine | Admitting: Emergency Medicine

## 2018-05-03 DIAGNOSIS — I10 Essential (primary) hypertension: Secondary | ICD-10-CM | POA: Diagnosis not present

## 2018-05-03 DIAGNOSIS — F10229 Alcohol dependence with intoxication, unspecified: Secondary | ICD-10-CM | POA: Insufficient documentation

## 2018-05-03 DIAGNOSIS — Z79899 Other long term (current) drug therapy: Secondary | ICD-10-CM | POA: Diagnosis not present

## 2018-05-03 DIAGNOSIS — R000 Tachycardia, unspecified: Secondary | ICD-10-CM

## 2018-05-03 DIAGNOSIS — I495 Sick sinus syndrome: Secondary | ICD-10-CM | POA: Diagnosis not present

## 2018-05-03 DIAGNOSIS — F10920 Alcohol use, unspecified with intoxication, uncomplicated: Secondary | ICD-10-CM

## 2018-05-03 LAB — CBC WITH DIFFERENTIAL/PLATELET
ABS IMMATURE GRANULOCYTES: 0.01 10*3/uL (ref 0.00–0.07)
Basophils Absolute: 0.1 10*3/uL (ref 0.0–0.1)
Basophils Relative: 1 %
Eosinophils Absolute: 0.1 10*3/uL (ref 0.0–0.5)
Eosinophils Relative: 1 %
HCT: 37.4 % — ABNORMAL LOW (ref 39.0–52.0)
HEMOGLOBIN: 12.1 g/dL — AB (ref 13.0–17.0)
IMMATURE GRANULOCYTES: 0 %
LYMPHS ABS: 2.7 10*3/uL (ref 0.7–4.0)
LYMPHS PCT: 43 %
MCH: 32.4 pg (ref 26.0–34.0)
MCHC: 32.4 g/dL (ref 30.0–36.0)
MCV: 100 fL (ref 80.0–100.0)
MONO ABS: 0.5 10*3/uL (ref 0.1–1.0)
MONOS PCT: 7 %
NEUTROS ABS: 3 10*3/uL (ref 1.7–7.7)
NEUTROS PCT: 48 %
Platelets: 205 10*3/uL (ref 150–400)
RBC: 3.74 MIL/uL — ABNORMAL LOW (ref 4.22–5.81)
RDW: 15.7 % — ABNORMAL HIGH (ref 11.5–15.5)
WBC: 6.3 10*3/uL (ref 4.0–10.5)
nRBC: 0 % (ref 0.0–0.2)

## 2018-05-03 LAB — BASIC METABOLIC PANEL
ANION GAP: 15 (ref 5–15)
BUN: 13 mg/dL (ref 8–23)
CHLORIDE: 108 mmol/L (ref 98–111)
CO2: 20 mmol/L — ABNORMAL LOW (ref 22–32)
Calcium: 8.7 mg/dL — ABNORMAL LOW (ref 8.9–10.3)
Creatinine, Ser: 0.75 mg/dL (ref 0.61–1.24)
GFR calc Af Amer: >60 mL/min (ref >60)
GLUCOSE: 106 mg/dL — AB (ref 70–99)
POTASSIUM: 3.9 mmol/L (ref 3.5–5.1)
Sodium: 143 mmol/L (ref 135–145)

## 2018-05-03 MED ORDER — VITAMIN B-1 100 MG PO TABS
100.0000 mg | ORAL_TABLET | Freq: Once | ORAL | Status: AC
  Administered 2018-05-03: 100 mg via ORAL
  Filled 2018-05-03: qty 1

## 2018-05-03 NOTE — ED Notes (Signed)
Pt requesting his bag back. This RN took bottle of liquor out of bag and left at Triage desk before giving pt his suitcase back.

## 2018-05-03 NOTE — ED Provider Notes (Signed)
[facility] DEPT Provider Note   CSN: [id number] Arrival date & time: 05/03/18  1448     History   Chief Complaint Chief Complaint  Patient presents with  . Depression  . Alcohol Intoxication    HPI Anthony [name] is a 62 y.o. male.  62 yo M with a chief complaints of alcohol intoxication.  [place] park.  EMS was called and supposedly his blood pressure was elevated and so he was transported here.  Patient is been drinking all day.  He states that he has not been eating much recently.  Denies chest pain abdominal pain fevers chills.  States that he has chronic back pain he fractured 3 bones in his back about 6 years ago.  Does have medications for that.  Has been taking some vitamins at home.  Unsure of fevers or chills.  The history is provided by the patient.  Depression  Pertinent negatives include no chest pain, no abdominal pain, no headaches and no shortness of breath.  Alcohol Intoxication  Pertinent negatives include no chest pain, no abdominal pain, no headaches and no shortness of breath.  Illness  This is a new problem. The current episode started yesterday. The problem occurs constantly. The problem has not changed since onset.Pertinent negatives include no chest pain, no abdominal pain, no headaches and no shortness of breath. Nothing aggravates the symptoms. Nothing relieves the symptoms. He has tried nothing for the symptoms. The treatment provided no relief.    Past Medical History:  Diagnosis Date  . Alcoholism ([place])   . Hydrocele    Surgically corrected  . Hypertension   . Lumbar compression fracture ([place])   . Skin cancer    exciced 2017    Patient Active Problem List   Diagnosis Date Noted  . Tooth decay 04/25/2018  . Anxiety state 04/25/2018  . Need for immunization against influenza 04/25/2018  . Lightheadedness 11/28/2017  . Pancytopenia ([place])   . Alcohol withdrawal ([place]) 09/01/2017  .  Delirium tremens ([place]) 09/01/2017  . Altered mental status   . Dyspnea 11/27/2016  . Actinic keratosis 11/27/2016  . Macrocytic anemia 11/23/2016  . Dry eye 11/23/2016  . Right pulmonary embolus ([place]) 11/02/2016  . Hiatal hernia 09/14/2016  . Skin cancer 08/13/2016  . Toe pain 07/07/2013  . Poor social situation 06/09/2013  . Tinea versicolor 06/08/2013  . Trigger finger, acquired 11/18/2012  . 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 HTN (hypertension) 04/11/2012  . Alcohol abuse 09/16/2006  . Chronic low back pain 09/16/2006    Past Surgical History:  Procedure Laterality Date  . HIATAL HERNIA REPAIR  2016  . HYDROCELE EXCISION / REPAIR  2010  . SKIN CANCER EXCISION Left    Excised 2017        Home Medications    Prior to Admission medications   Medication Sig Start Date End Date Taking? Authorizing Provider  acetaminophen (TYLENOL) 650 MG CR tablet Take 1 tablet (650 mg total) by mouth every 8 (eight) hours as needed for pain. Patient taking differently: Take 1,300 mg by mouth 2 (two) times daily as needed for pain.  01/07/18  Yes Sheikh, Omair Latif, DO  amLODipine (NORVASC) 5 MG tablet Take 1 tablet (5 mg total) by mouth daily. 01/08/18  Yes Sheikh, Omair Latif, DO  cyanocobalamin ([employer] VITAMIN B12) 1000 MCG tablet Take 1 tablet (1,000 mcg total) by mouth daily. 01/07/18  Yes Sheikh, Omair Latif, DO  ferrous sulfate 325 (65 FE) MG tablet Take 1 tablet (325 mg total) by mouth 2 (two) times a week. 01/10/18  Yes Sheikh, Omair Latif, DO  folic acid (FOLVITE) 1 MG tablet Take 1 tablet (1 mg total) by mouth daily. 01/08/18  Yes Sheikh, Omair Latif, DO  hydrochlorothiazide (HYDRODIURIL) 25 MG tablet Take 25 mg by mouth daily. 03/18/18  Yes [provider]  methocarbamol (ROBAXIN) 750 MG tablet TAKE 1 TABLET (750 MG TOTAL) BY MOUTH EVERY 8 (EIGHT) HOURS AS NEEDED. 04/25/18  Yes Winfrey, Alcario Drought, MD  Multiple Vitamin  (MULTIVITAMIN WITH MINERALS) TABS tablet Take 1 tablet by mouth 2 (two) times a week. 01/10/18  Yes Sheikh, Omair Latif, DO  sertraline (ZOLOFT) 100 MG tablet Take 0.5 tablets (50 mg total) by mouth daily. 04/22/18  Yes Winfrey, Alcario Drought, MD  thiamine (VITAMIN B-1) 100 MG tablet Take 1 tablet (100 mg total) by mouth daily. 03/22/18  Yes Winfrey, Alcario Drought, MD  vitamin B-6 (PYRIDOXINE) 25 MG tablet Take 1 tablet (25 mg total) by mouth daily. 04/27/18  Yes Winfrey, Alcario Drought, MD  cetaphil (CETAPHIL) lotion Apply topically daily. Patient not taking: Reported on 05/03/2018 09/06/17   Bufford Lope, DO  cetirizine (ZYRTEC) 10 MG tablet Take 1 tablet (10 mg total) by mouth daily. 01/07/18   Raiford Noble Latif, DO  hydroxypropyl methylcellulose / hypromellose (ISOPTO TEARS / GONIOVISC) 2.5 % ophthalmic solution Place 1 drop into both eyes as needed for dry eyes. Patient not taking: Reported on 05/03/2018 01/07/18   Raiford Noble Latif, DO  ketoconazole (NIZORAL) 2 % cream Apply topically 2 (two) times daily as needed for irritation. Patient not taking: Reported on 05/03/2018 04/22/18   Kathrene Alu, MD    Family History Family History  Problem Relation Age of Onset  . Heart attack Father        died at 6 years  . Dementia Father   . Stroke Father   . Cancer Sister     Social History Social History   Tobacco Use  . Smoking status: Never Smoker  . Smokeless tobacco: Never Used  Substance Use Topics  . Alcohol use: Yes    Alcohol/week: 84.0 standard drinks    Types: 84 Cans of beer per week  . Drug use: Yes    Types: Marijuana    Comment: occasional     Allergies   Patient has no known allergies.   Review of Systems Review of Systems  Constitutional: Negative for chills and fever.  HENT: Negative for congestion and facial swelling.   Eyes: Negative for discharge and visual disturbance.  Respiratory: Negative for shortness of breath.   Cardiovascular: Negative for chest pain and  palpitations.  Gastrointestinal: Negative for abdominal pain, diarrhea and vomiting.  Musculoskeletal: Positive for back pain. Negative for arthralgias and myalgias.  Skin: Negative for color change and rash.  Neurological: Negative for tremors, syncope and headaches.  Psychiatric/Behavioral: Positive for depression. Negative for confusion and dysphoric mood.     Physical Exam Updated Vital Signs BP 123/87 (BP Location: Left Arm)   Pulse (!) 114   Temp 98 F (36.7 C) (Oral)   Resp 18   SpO2 95%   Physical Exam  Constitutional: He is oriented to person, place, and time. He appears well-developed and well-nourished.  HENT:  Head: Normocephalic and atraumatic.  Eyes: Pupils are equal, round, and reactive to light. EOM are normal.  Neck: Normal range of motion.  Neck supple. No JVD present.  Cardiovascular: Regular rhythm. Tachycardia present. Exam reveals no gallop and no friction rub.  No murmur heard. Pulmonary/Chest: No respiratory distress. He has no wheezes.  Abdominal: He exhibits no distension and no mass. There is no tenderness. There is no rebound and no guarding.  Musculoskeletal: Normal range of motion.  Neurological: He is alert and oriented to person, place, and time.  Skin: No rash noted. No pallor.  Psychiatric: He has a normal mood and affect. His behavior is normal.  Nursing note and vitals reviewed.    ED Treatments / Results  Labs (all labs ordered are listed, but only abnormal results are displayed) Labs Reviewed  CBC WITH DIFFERENTIAL/PLATELET - Abnormal; Notable for the following components:      Result Value   RBC 3.74 (*)    Hemoglobin 12.1 (*)    HCT 37.4 (*)    RDW 15.7 (*)    All other components within normal limits  BASIC METABOLIC PANEL - Abnormal; Notable for the following components:   CO2 20 (*)    Glucose, Bld 106 (*)    Calcium 8.7 (*)    All other components within normal limits  URINALYSIS, ROUTINE W REFLEX MICROSCOPIC     EKG None  Radiology No results found.  Procedures Procedures (including critical care time)  Medications Ordered in ED Medications  thiamine (VITAMIN B-1) tablet 100 mg (100 mg Oral Given 05/03/18 1703)     Initial Impression / Assessment and Plan / ED Course  I have reviewed the triage vital signs and the nursing notes.  Pertinent labs & imaging results that were available during my care of the patient were reviewed by me and considered in my medical decision making (see chart for details).     62 yo M with a chief complaint of alcohol intoxication.  The patient is able to ambulate without difficulty I feel he is likely safe for discharge home however he had a documented heart rate of 114 in triage.  Will obtain basic laboratory evaluation.  Give a dose of thiamine.  Lab work with very trace acidosis.  No anion gap.  I went to go discuss the results with the patient that he had already left.  He apparently was yelling obscenities and the triage area and decided to leave.  The patients results and plan were reviewed and discussed.   Any x-rays performed were independently reviewed by myself.   Differential diagnosis were considered with the presenting HPI.  Medications  thiamine (VITAMIN B-1) tablet 100 mg (100 mg Oral Given 05/03/18 1703)    Vitals:   05/03/18 1458  BP: 123/87  Pulse: (!) 114  Resp: 18  Temp: 98 F (36.7 C)  TempSrc: Oral  SpO2: 95%    Final diagnoses:  Alcoholic intoxication without complication (HCC)  Sinus tachycardia    Admission/ observation were discussed with the admitting physician, patient and/or family and they are comfortable with the plan.    Final Clinical Impressions(s) / ED Diagnoses   Final diagnoses:  Alcoholic intoxication without complication [facility])  Sinus tachycardia    ED Discharge Orders    None       Deno Etienne, DO 05/03/18 1843

## 2018-05-03 NOTE — Progress Notes (Signed)
Type of Service: Clinical Social Work  [facility] intern phone call to patient to follow up on patient getting connected to substance use services. Patient was unavailable. [facility] intern left message and provided patient with a number to call back.  Plan: 1. [facility] intern will wait for call from patient.     Audry Riles, [place] intern Behavioral Health Clinician,  [place] [phone]

## 2018-05-03 NOTE — ED Notes (Signed)
Patient came to nurse's desk stating " I have been waiting to long and need to go to get food, I'll be back." Informed patient that he will have to check back in. Patient stated "no I will be back and I am leaving my stuff in that room." Security called to help descalate the situation. Explained to patient again, that if he leaves the hospital property, he will need to take his personal belongings with him, as the hospital will not be held reliable for anything that (could) happen to them.Patient was escorted out by security.

## 2018-05-03 NOTE — ED Notes (Signed)
Pt came to nurse's desk asking how long he will be here because "I have already been here for over 2 hours". This RN explained that waiting for blood work results to come back and once EDP has them he will come give plan of care. Patient asking if he will be getting a charge for being here.  Informed pt that people due get a charge for being seen in the ED, patient asking how much the charges are. Informed him that depended on what he was still being seen for and all tests that are done.  Pt stated that he wanted a "real room one with a bed". Informed pt that right now all our rooms and Anthony [name] beds are occupied at this time but if he would be admitted that we would move him to a room that has a bed, but for the time being, he needed to wait in triage room that has lounge chair and tv to watch.

## 2018-05-03 NOTE — ED Triage Notes (Signed)
Per [facility] pt from parking lot where is was sleeping and by standers called. denid wanting to harm himself but depressed and using ETOH.   CBG 101, BP 138/88 Pt found by ED staff drinking ETOH in triage room when went to get vital signs.

## 2018-05-03 NOTE — ED Notes (Signed)
Bed: [facility] Expected date:  Expected time:  Means of arrival:  Comments: 

## 2018-05-11 ENCOUNTER — Ambulatory Visit: Payer: Medicaid Other | Admitting: Family Medicine

## 2018-05-18 ENCOUNTER — Emergency Department (HOSPITAL_COMMUNITY): Payer: Medicaid Other

## 2018-05-18 ENCOUNTER — Encounter (HOSPITAL_COMMUNITY): Payer: Self-pay

## 2018-05-18 ENCOUNTER — Other Ambulatory Visit: Payer: Self-pay

## 2018-05-18 ENCOUNTER — Emergency Department (HOSPITAL_COMMUNITY)
Admission: EM | Admit: 2018-05-18 | Discharge: 2018-05-19 | Disposition: A | Discharge status: Home / Self Care | Payer: Medicaid Other | Attending: Emergency Medicine | Admitting: Emergency Medicine

## 2018-05-18 DIAGNOSIS — Z79899 Other long term (current) drug therapy: Secondary | ICD-10-CM | POA: Diagnosis not present

## 2018-05-18 DIAGNOSIS — F1020 Alcohol dependence, uncomplicated: Secondary | ICD-10-CM | POA: Diagnosis not present

## 2018-05-18 DIAGNOSIS — I10 Essential (primary) hypertension: Secondary | ICD-10-CM | POA: Diagnosis not present

## 2018-05-18 DIAGNOSIS — F419 Anxiety disorder, unspecified: Secondary | ICD-10-CM | POA: Insufficient documentation

## 2018-05-18 DIAGNOSIS — Z590 Homelessness: Secondary | ICD-10-CM | POA: Insufficient documentation

## 2018-05-18 DIAGNOSIS — F1010 Alcohol abuse, uncomplicated: Secondary | ICD-10-CM

## 2018-05-18 LAB — ETHANOL: Alcohol, Ethyl (B): 369 mg/dL (ref <10)

## 2018-05-18 LAB — COMPREHENSIVE METABOLIC PANEL
ALT: 39 U/L (ref 0–44)
AST: 66 U/L — ABNORMAL HIGH (ref 15–41)
Albumin: 3.4 g/dL — ABNORMAL LOW (ref 3.5–5.0)
Alkaline Phosphatase: 58 U/L (ref 38–126)
Anion gap: 7 (ref 5–15)
BUN: 7 mg/dL — AB (ref 8–23)
CALCIUM: 8.6 mg/dL — AB (ref 8.9–10.3)
CO2: 25 mmol/L (ref 22–32)
CREATININE: 0.58 mg/dL — AB (ref 0.61–1.24)
Chloride: 109 mmol/L (ref 98–111)
GFR calc Af Amer: >60 mL/min (ref >60)
GFR calc non Af Amer: >60 mL/min (ref >60)
GLUCOSE: 91 mg/dL (ref 70–99)
Potassium: 4.1 mmol/L (ref 3.5–5.1)
SODIUM: 141 mmol/L (ref 135–145)
TOTAL PROTEIN: 6.8 g/dL (ref 6.5–8.1)
Total Bilirubin: 0.3 mg/dL (ref 0.3–1.2)

## 2018-05-18 LAB — RAPID URINE DRUG SCREEN, HOSP PERFORMED
AMPHETAMINES: NOT DETECTED
BARBITURATES: NOT DETECTED
BENZODIAZEPINES: NOT DETECTED
Cocaine: NOT DETECTED
Opiates: NOT DETECTED
Tetrahydrocannabinol: NOT DETECTED

## 2018-05-18 LAB — CBC
HEMATOCRIT: 32.5 % — AB (ref 39.0–52.0)
Hemoglobin: 10.2 g/dL — ABNORMAL LOW (ref 13.0–17.0)
MCH: 32.6 pg (ref 26.0–34.0)
MCHC: 31.4 g/dL (ref 30.0–36.0)
MCV: 103.8 fL — AB (ref 80.0–100.0)
PLATELETS: 254 10*3/uL (ref 150–400)
RBC: 3.13 MIL/uL — ABNORMAL LOW (ref 4.22–5.81)
RDW: 18.2 % — AB (ref 11.5–15.5)
WBC: 4.3 10*3/uL (ref 4.0–10.5)
nRBC: 0 % (ref 0.0–0.2)

## 2018-05-18 MED ORDER — VITAMIN B-12 1000 MCG PO TABS
1000.0000 ug | ORAL_TABLET | Freq: Every day | ORAL | Status: DC
  Administered 2018-05-18 – 2018-05-19 (×2): 1000 ug via ORAL
  Filled 2018-05-18 (×2): qty 1

## 2018-05-18 MED ORDER — VITAMIN B-1 100 MG PO TABS
100.0000 mg | ORAL_TABLET | Freq: Every day | ORAL | Status: DC
  Administered 2018-05-19 (×2): 100 mg via ORAL
  Filled 2018-05-18: qty 1

## 2018-05-18 MED ORDER — LORAZEPAM 1 MG PO TABS
0.0000 mg | ORAL_TABLET | Freq: Four times a day (QID) | ORAL | Status: DC
  Administered 2018-05-19 (×2): 2 mg via ORAL
  Filled 2018-05-18 (×2): qty 2

## 2018-05-18 MED ORDER — ALUM & MAG HYDROXIDE-SIMETH 200-200-20 MG/5ML PO SUSP: 30.0000 mL | Freq: Four times a day (QID) | ORAL | Status: DC | PRN

## 2018-05-18 MED ORDER — ADULT MULTIVITAMIN W/MINERALS CH
1.0000 | ORAL_TABLET | ORAL | Status: DC
  Administered 2018-05-19: 1 via ORAL
  Filled 2018-05-18: qty 1

## 2018-05-18 MED ORDER — VITAMIN B-1 100 MG PO TABS: 100.0000 mg | ORAL_TABLET | Freq: Every day | ORAL | Status: DC

## 2018-05-18 MED ORDER — FOLIC ACID 1 MG PO TABS
1.0000 mg | ORAL_TABLET | Freq: Every day | ORAL | Status: DC
  Administered 2018-05-19: 1 mg via ORAL
  Filled 2018-05-18: qty 1

## 2018-05-18 MED ORDER — SODIUM CHLORIDE 0.9 % IV BOLUS
1000.0000 mL | Freq: Once | INTRAVENOUS | Status: AC
  Administered 2018-05-18: 1000 mL via INTRAVENOUS

## 2018-05-18 MED ORDER — ONDANSETRON HCL 4 MG PO TABS: 4.0000 mg | ORAL_TABLET | Freq: Three times a day (TID) | ORAL | Status: DC | PRN

## 2018-05-18 MED ORDER — THIAMINE HCL 100 MG/ML IJ SOLN: 100.0000 mg | Freq: Every day | INTRAMUSCULAR | Status: DC

## 2018-05-18 MED ORDER — PYRIDOXINE HCL 25 MG PO TABS
25.0000 mg | ORAL_TABLET | Freq: Every day | ORAL | Status: DC
  Administered 2018-05-18 – 2018-05-19 (×2): 25 mg via ORAL
  Filled 2018-05-18 (×2): qty 1

## 2018-05-18 MED ORDER — METHOCARBAMOL 500 MG PO TABS
750.0000 mg | ORAL_TABLET | Freq: Three times a day (TID) | ORAL | Status: DC | PRN
  Administered 2018-05-19 (×2): 750 mg via ORAL
  Filled 2018-05-18 (×2): qty 2

## 2018-05-18 MED ORDER — ZOLPIDEM TARTRATE 5 MG PO TABS
5.0000 mg | ORAL_TABLET | Freq: Every evening | ORAL | Status: DC | PRN
  Administered 2018-05-18 – 2018-05-19 (×2): 5 mg via ORAL
  Filled 2018-05-18 (×2): qty 1

## 2018-05-18 MED ORDER — NICOTINE 21 MG/24HR TD PT24
21.0000 mg | MEDICATED_PATCH | Freq: Every day | TRANSDERMAL | Status: DC
  Filled 2018-05-18: qty 1

## 2018-05-18 MED ORDER — LORAZEPAM 1 MG PO TABS: 0.0000 mg | ORAL_TABLET | Freq: Two times a day (BID) | ORAL | Status: DC

## 2018-05-18 MED ORDER — HYDROCHLOROTHIAZIDE 25 MG PO TABS
25.0000 mg | ORAL_TABLET | Freq: Every day | ORAL | Status: DC
  Administered 2018-05-19: 25 mg via ORAL
  Filled 2018-05-18: qty 1

## 2018-05-18 MED ORDER — LORAZEPAM 2 MG/ML IJ SOLN: 0.0000 mg | Freq: Two times a day (BID) | INTRAMUSCULAR | Status: DC

## 2018-05-18 MED ORDER — LORATADINE 10 MG PO TABS
10.0000 mg | ORAL_TABLET | Freq: Every day | ORAL | Status: DC
  Administered 2018-05-18 – 2018-05-19 (×2): 10 mg via ORAL
  Filled 2018-05-18 (×2): qty 1

## 2018-05-18 MED ORDER — AMLODIPINE BESYLATE 5 MG PO TABS
5.0000 mg | ORAL_TABLET | Freq: Every day | ORAL | Status: DC
  Administered 2018-05-19: 5 mg via ORAL
  Filled 2018-05-18: qty 1

## 2018-05-18 MED ORDER — LORAZEPAM 2 MG/ML IJ SOLN: 0.0000 mg | Freq: Four times a day (QID) | INTRAMUSCULAR | Status: DC

## 2018-05-18 MED ORDER — SERTRALINE HCL 50 MG PO TABS
50.0000 mg | ORAL_TABLET | Freq: Every day | ORAL | Status: DC
  Administered 2018-05-19: 50 mg via ORAL
  Filled 2018-05-18: qty 1

## 2018-05-18 NOTE — ED Notes (Signed)
[name], PA, recommended overnight observation for safety due to intoxication. RN informed of disposition.

## 2018-05-18 NOTE — ED Notes (Signed)
Pt states that he is not here to get an EKG done. He is here because he is homeless. Pt states that he would not like [place] to do unnecessary testing.

## 2018-05-18 NOTE — ED Triage Notes (Addendum)
[place] the park. Pt requesting detox from alcohol. Pt states he drank today, and he drank "a lot". Per person familiar with pt, pt does not drink, but instead acts strangely and pretends to be intoxicated to visit the hospital. Pt states he has had a 40 oz beer.

## 2018-05-18 NOTE — BH Assessment (Addendum)
Tele Assessment Note   Patient Name: Anthony [name] MRN: [id number] Referring Physician: Dr. Regenia Skeeter Location of Patient: [facility] Location of Provider: Behavioral Health TTS Department  Anthony [name] is an 62 y.o. male presenting with intoxication requesting alcohol detox. Patient denies SI and HI. Patient is homeless. Patient reported calling EMS and requesting treatment so they brought him to ED. Last inpatient treatment was 01/04/18 at [facility]. However patient was discharged from ED on 05/03/18 for similar presentation after being found sleeping at the park. Patient reports drinking 12pk beer daily. Patient reported seeing someone at [facility], however he needs to be detoxed first. Patient reported he started drinking when he was 62 years old and has since been sober for 4 years straight. Patient reports increased depression as he has no family or friends support. Patient reported he is divorced with 3 adult children and they have negative feelings towards with no contact. Patient reported not receiving no mental health services at this time. When asked about hallucinations, patient stated, "I can't tell, some times I talk to people who is not there".   Patient was calm and cooperative. Patient was alert and oriented x4. Judgement impaired due to BAL 369 upon arrival at ED. Patient mood/affect was helpless and sad. Speech was logical and coherent. Patient eye contact was fair.   UDS negative BAL 369  Diagnosis: Alcohol depedence and Anxiety disorder  Past Medical History:  Past Medical History:  Diagnosis Date  . Alcoholism ([place])   . Hydrocele    Surgically corrected  . Hypertension   . Lumbar compression fracture ([place])   . Skin cancer    exciced 2017    Past Surgical History:  Procedure Laterality Date  . HIATAL HERNIA REPAIR  2016  . HYDROCELE EXCISION / REPAIR  2010  . SKIN CANCER EXCISION Left    Excised 2017    Family History:  Family History  Problem Relation Age of Onset   . Heart attack Father        died at 15 years  . Dementia Father   . Stroke Father   . Cancer Sister     Social History:  reports that he has never smoked. He has never used smokeless tobacco. He reports that he drinks about 84.0 standard drinks of alcohol per week. He reports that he has current or past drug history. Drug: Marijuana.  Additional Social History:  Alcohol / Drug Use Pain Medications: see MAR Prescriptions: see MAR Over the Counter: see MAR  CIWA: CIWA-Ar BP: 105/72 Pulse Rate: 89 COWS:    Allergies: No Known Allergies  Home Medications:  (Not in a hospital admission)  OB/GYN Status:  No LMP for male patient.  General Assessment Data Location of Assessment: [facility] TTS Assessment: In system Is this a Tele or Face-to-Face Assessment?: Face-to-Face Is this an Initial Assessment or a Re-assessment for this encounter?: Initial Assessment Patient Accompanied by:: N/A Language Other than English: No Living Arrangements: Homeless/Shelter What gender do you identify as?: Male Marital status: Divorced Pregnancy Status: (n/a) Living Arrangements: (homeless) Can pt return to current living arrangement?: Yes Admission Status: Voluntary Is patient capable of signing voluntary admission?: Yes Referral Source: Self/Family/Friend     Crisis Care Plan Living Arrangements: (homeless) Legal Guardian: (self) Name of Psychiatrist: (none) Name of Therapist: (none)  Education Status Is patient currently in school?: No Is the patient employed, unemployed or receiving disability?: Receiving disability income  Risk to self with the past 6  months Suicidal Ideation: No Has patient been a risk to self within the past 6 months prior to admission? : No Suicidal Intent: No Has patient had any suicidal intent within the past 6 months prior to admission? : No Is patient at risk for suicide?: No Suicidal Plan?: No Has patient had any suicidal plan within the past 6 months  prior to admission? : No Access to Means: No What has been your use of drugs/alcohol within the last 12 months?: (12pk beer daily BAL 369 today) Previous Attempts/Gestures: No How many times?: (n/a) Other Self Harm Risks: (n/a) Triggers for Past Attempts: (n/a) Intentional Self Injurious Behavior: None Family Suicide History: No Recent stressful life event(s): Financial Problems(alcoholism and homeless, no support system) Persecutory voices/beliefs?: No Depression: Yes Depression Symptoms: Fatigue, Feeling worthless/self pity Substance abuse history and/or treatment for substance abuse?: Yes  Risk to Others within the past 6 months Homicidal Ideation: No Does patient have any lifetime risk of violence toward others beyond the six months prior to admission? : No Thoughts of Harm to Others: No Current Homicidal Intent: No Current Homicidal Plan: No Access to Homicidal Means: No Identified Victim: (n/a) History of harm to others?: No Assessment of Violence: None Noted Does patient have access to weapons?: No Criminal Charges Pending?: No Does patient have a court date: Yes(open container and trespassing) Court Date: (05/2018) Is patient on probation?: No  Psychosis Hallucinations: Auditory("I talk to people who aren't there" no command voices) Delusions: None noted  Mental Status Report Appearance/Hygiene: Unremarkable Eye Contact: Fair Motor Activity: Unremarkable Speech: Logical/coherent Level of Consciousness: Alert Mood: Helpless, Sad Affect: Sad Anxiety Level: Minimal Thought Processes: Coherent Judgement: Impaired Orientation: Person, Place, Time, Situation Obsessive Compulsive Thoughts/Behaviors: None  Cognitive Functioning Concentration: Normal Memory: Recent Intact Is patient IDD: No Insight: Fair Impulse Control: Fair Appetite: Poor Have you had any weight changes? : No Change Sleep: No Change Total Hours of Sleep: (up and down) Vegetative Symptoms:  None  ADLScreening [facility] Assessment Services) Patient's cognitive ability adequate to safely complete daily activities?: Yes Patient able to express need for assistance with ADLs?: Yes Independently performs ADLs?: Yes (appropriate for developmental age)  Prior Inpatient Therapy Prior Inpatient Therapy: Yes Prior Therapy Dates: (12/2016) Prior Therapy Facilty/Provider(s): [facility]) Reason for Treatment: (alcohol)  Prior Outpatient Therapy Prior Outpatient Therapy: No Does patient have an ACCT team?: No Does patient have Intensive In-House Services?  : No Does patient have [facility] services? : No Does patient have P4CC services?: No  ADL Screening (condition at time of admission) Patient's cognitive ability adequate to safely complete daily activities?: Yes Patient able to express need for assistance with ADLs?: Yes Independently performs ADLs?: Yes (appropriate for developmental age)             Regulatory affairs officer (For Healthcare) Does Patient Have a Medical Advance Directive?: No Would patient like information on creating a medical advance directive?: No - Patient declined          Disposition:  Disposition Initial Assessment Completed for this Encounter: Yes  [name], PA, recommended overnight observation for safety due to intoxication. RN informed of disposition.  This service was provided via telemedicine using a 2-way, interactive audio and video technology.  Names of all persons participating in this telemedicine service and their role in this encounter. Name: Landin Berger Role: patient  Name: Kirtland Bouchard, [facility] Role: TTS Clinician  Name:  Role:   Name:  Role:     Venora Maples 05/18/2018 8:32  PM

## 2018-05-18 NOTE — ED Provider Notes (Signed)
[facility] DEPT Provider Note   CSN: [id number] Arrival date & time: 05/18/18  1505     History   Chief Complaint Chief Complaint  Patient presents with  . Homeless    HPI Anthony [name] is a 62 y.o. male.  HPI  62 year old male with a history of alcohol abuse presents with needing detox.  States he has seen the [facility] about coming off of alcohol but he stated he needs to be detoxed first.  He states he has been getting a [place] more depressed recently.  He estimates he drinks about 12 beers per day including today.  He states he is feels lightheaded often, especially when standing.  He cannot tell me exactly how long this is been going on.  He also has chronic shortness of breath that he states mostly occurs whenever he tries to run or goes long distances.  This is not a serially worse than typical and is been going on for months.  There is no chest pain.  He denies any headache or focal weakness.  When asked about suicidal thoughts, he tells me know but also states he would not tell me if he was suicidal.  Past Medical History:  Diagnosis Date  . Alcoholism ([place])   . Hydrocele    Surgically corrected  . Hypertension   . Lumbar compression fracture ([place])   . Skin cancer    exciced 2017    Patient Active Problem List   Diagnosis Date Noted  . Tooth decay 04/25/2018  . Anxiety state 04/25/2018  . Need for immunization against influenza 04/25/2018  . Lightheadedness 11/28/2017  . Pancytopenia ([place])   . Alcohol withdrawal ([place]) 09/01/2017  . Delirium tremens ([place]) 09/01/2017  . Altered mental status   . Dyspnea 11/27/2016  . Actinic keratosis 11/27/2016  . Macrocytic anemia 11/23/2016  . Dry eye 11/23/2016  . Right pulmonary embolus ([place]) 11/02/2016  . Hiatal hernia 09/14/2016  . Skin cancer 08/13/2016  . Toe pain 07/07/2013  . Poor social situation 06/09/2013  . Tinea versicolor 06/08/2013  . Trigger finger, acquired 11/18/2012  .  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 HTN (hypertension) 04/11/2012  . Alcohol abuse 09/16/2006  . Chronic low back pain 09/16/2006    Past Surgical History:  Procedure Laterality Date  . HIATAL HERNIA REPAIR  2016  . HYDROCELE EXCISION / REPAIR  2010  . SKIN CANCER EXCISION Left    Excised 2017        Home Medications    Prior to Admission medications   Medication Sig Start Date End Date Taking? Authorizing Provider  acetaminophen (TYLENOL) 650 MG CR tablet Take 1 tablet (650 mg total) by mouth every 8 (eight) hours as needed for pain. Patient taking differently: Take 1,300 mg by mouth 2 (two) times daily as needed for pain.  01/07/18  Yes Sheikh, Omair Latif, DO  cetirizine (ZYRTEC) 10 MG tablet Take 1 tablet (10 mg total) by mouth daily. 01/07/18  Yes Sheikh, Omair Latif, DO  cyanocobalamin ([employer] VITAMIN B12) 1000 MCG tablet Take 1 tablet (1,000 mcg total) by mouth daily. 01/07/18  Yes Sheikh, Omair Latif, DO  diphenhydramine-acetaminophen (TYLENOL PM) 25-500 MG TABS tablet Take 1 tablet by mouth at bedtime.   Yes [provider]  ferrous sulfate 325 (65 FE) MG tablet Take 1 tablet (325 mg total) by mouth 2 (two) times a week. 01/10/18  Yes [facility] Noble [place], DO  folic acid (FOLVITE) 1 MG tablet Take 1 tablet (1 mg total) by mouth daily. 01/08/18  Yes Sheikh, Omair Latif, DO  hydrochlorothiazide (HYDRODIURIL) 25 MG tablet Take 25 mg by mouth daily. 03/18/18  Yes [provider]  methocarbamol (ROBAXIN) 750 MG tablet TAKE 1 TABLET (750 MG TOTAL) BY MOUTH EVERY 8 (EIGHT) HOURS AS NEEDED. 04/25/18  Yes Winfrey, Alcario Drought, MD  Multiple Vitamin (MULTIVITAMIN WITH MINERALS) TABS tablet Take 1 tablet by mouth 2 (two) times a week. 01/10/18  Yes Sheikh, Omair Latif, DO  sertraline (ZOLOFT) 100 MG tablet Take 0.5 tablets (50 mg total) by mouth daily. Patient taking differently: Take 100 mg by mouth daily.  04/22/18  Yes  Winfrey, Alcario Drought, MD  thiamine (VITAMIN B-1) 100 MG tablet Take 1 tablet (100 mg total) by mouth daily. 03/22/18  Yes Winfrey, Alcario Drought, MD  vitamin B-6 (PYRIDOXINE) 25 MG tablet Take 1 tablet (25 mg total) by mouth daily. 04/27/18  Yes Winfrey, Alcario Drought, MD  amLODipine (NORVASC) 5 MG tablet Take 1 tablet (5 mg total) by mouth daily. Patient not taking: Reported on 05/18/2018 01/08/18   [facility] Noble Latif, DO  cetaphil (CETAPHIL) lotion Apply topically daily. Patient not taking: Reported on 05/03/2018 09/06/17   [facility]ord Lope, DO  hydroxypropyl methylcellulose / hypromellose (ISOPTO TEARS / GONIOVISC) 2.5 % ophthalmic solution Place 1 drop into both eyes as needed for dry eyes. Patient not taking: Reported on 05/03/2018 01/07/18   [facility] Noble Latif, DO  ketoconazole (NIZORAL) 2 % cream Apply topically 2 (two) times daily as needed for irritation. Patient not taking: Reported on 05/03/2018 04/22/18   Kathrene Alu, MD    Family History Family History  Problem Relation Age of Onset  . Heart attack Father        died at 33 years  . Dementia Father   . Stroke Father   . Cancer Sister     Social History Social History   Tobacco Use  . Smoking status: Never Smoker  . Smokeless tobacco: Never Used  Substance Use Topics  . Alcohol use: Yes    Alcohol/week: 84.0 standard drinks    Types: 84 Cans of beer per week  . Drug use: Yes    Types: Marijuana    Comment: occasional     Allergies   Patient has no known allergies.   Review of Systems Review of Systems  Constitutional: Negative for fever.  Eyes: Negative for visual disturbance.  Respiratory: Positive for shortness of breath.   Cardiovascular: Negative for chest pain.  Gastrointestinal: Negative for abdominal pain and vomiting.  Neurological: Positive for light-headedness. Negative for weakness and headaches.  Psychiatric/Behavioral: Positive for dysphoric mood. Negative for suicidal ideas.  All other systems reviewed and  are negative.    Physical Exam Updated Vital Signs BP 105/72 (BP Location: Left Arm)   Pulse 89   Temp 98 F (36.7 C) (Oral)   Resp 18   Wt 78.7 kg   SpO2 99%   BMI 24.89 kg/m   Physical Exam  Constitutional: He is oriented to person, place, and time. He appears well-developed and well-nourished. No distress.  HENT:  Head: Normocephalic and atraumatic.  Right Ear: External ear normal.  Left Ear: External ear normal.  Nose: Nose normal.  Eyes: Right eye exhibits no discharge. Left eye exhibits no discharge.  Neck: Neck supple.  Cardiovascular: Normal rate, regular rhythm and normal heart sounds.  Pulmonary/Chest: Effort normal and breath sounds normal. He has no  wheezes.  Abdominal: Soft. There is no tenderness.  Musculoskeletal: He exhibits no edema.  Neurological: He is alert and oriented to person, place, and time.  CN 3-12 grossly intact. 5/5 strength in all 4 extremities. Grossly normal sensation. Normal gait  Skin: Skin is warm and dry. He is not diaphoretic.  Psychiatric: He expresses no suicidal ideation.  Nursing note and vitals reviewed.    ED Treatments / Results  Labs (all labs ordered are listed, but only abnormal results are displayed) Labs Reviewed  COMPREHENSIVE METABOLIC PANEL - Abnormal; Notable for the following components:      Result Value   BUN 7 (*)    Creatinine, Ser 0.58 (*)    Calcium 8.6 (*)    Albumin 3.4 (*)    AST 66 (*)    All other components within normal limits  ETHANOL - Abnormal; Notable for the following components:   Alcohol, Ethyl (B) 369 (*)    All other components within normal limits  CBC - Abnormal; Notable for the following components:   RBC 3.13 (*)    Hemoglobin 10.2 (*)    HCT 32.5 (*)    MCV 103.8 (*)    RDW 18.2 (*)    All other components within normal limits  RAPID URINE DRUG SCREEN, HOSP PERFORMED    EKG None  Radiology Dg Chest 2 View  Result Date: 05/18/2018 CLINICAL DATA:  Requesting detox from  alcoholism. Drank a lot. Dyspnea EXAM: CHEST - 2 VIEW COMPARISON:  01/04/2018 FINDINGS: Bilateral mild interstitial thickening. Mild bibasilar airspace disease likely reflecting atelectasis. No pleural effusion or pneumothorax. Stable cardiomediastinal silhouette. No acute osseous abnormality. IMPRESSION: Mild bibasilar atelectasis. Otherwise no acute cardiopulmonary disease. Electronically Signed   By: Kathreen Devoid   On: 05/18/2018 18:39    Procedures Procedures (including critical care time)  Medications Ordered in ED Medications  loratadine (CLARITIN) tablet 10 mg (has no administration in time range)  vitamin B-12 (CYANOCOBALAMIN) tablet 1,000 mcg (has no administration in time range)  folic acid (FOLVITE) tablet 1 mg (has no administration in time range)  hydrochlorothiazide (HYDRODIURIL) tablet 25 mg (has no administration in time range)  amLODipine (NORVASC) tablet 5 mg (has no administration in time range)  methocarbamol (ROBAXIN) tablet 750 mg (has no administration in time range)  multivitamin with minerals tablet 1 tablet (has no administration in time range)  sertraline (ZOLOFT) tablet 50 mg (has no administration in time range)  thiamine (VITAMIN B-1) tablet 100 mg (has no administration in time range)  pyridOXINE (VITAMIN B-6) tablet 25 mg (has no administration in time range)  ondansetron (ZOFRAN) tablet 4 mg (has no administration in time range)  alum & mag hydroxide-simeth (MAALOX/MYLANTA) 200-200-20 MG/5ML suspension 30 mL (has no administration in time range)  nicotine (NICODERM CQ - dosed in mg/24 hours) patch 21 mg (has no administration in time range)  zolpidem (AMBIEN) tablet 5 mg (has no administration in time range)  LORazepam (ATIVAN) injection 0-4 mg (has no administration in time range)    Or  LORazepam (ATIVAN) tablet 0-4 mg (has no administration in time range)  LORazepam (ATIVAN) injection 0-4 mg (has no administration in time range)    Or  LORazepam (ATIVAN)  tablet 0-4 mg (has no administration in time range)  thiamine (VITAMIN B-1) tablet 100 mg (has no administration in time range)    Or  thiamine (B-1) injection 100 mg (has no administration in time range)  sodium chloride 0.9 % bolus 1,000 mL ( Intravenous  Stopped 05/18/18 1821)     Initial Impression / Assessment and Plan / ED Course  I have reviewed the triage vital signs and the nursing notes.  Pertinent labs & imaging results that were available during my care of the patient were reviewed by me and considered in my medical decision making (see chart for details).     Patient has significant elevation in his EtOH level but he is not significantly intoxicated on my exam.  He describes multiple subacute to chronic issues but his main problem today is wanting alcohol detox.  He is endorsing some depression with some vague suicidal thoughts.  From a medical screening perspective, his labs are overall near baseline.  He will be placed on CIWA protocol.  TTS has been consulted.  He appears medically stable for psychiatric care. Of note, I was ordering an ECG given the subacute/chronic dyspnea but he has declined.  He understands I cannot fully rule out cardiac issues.  Final Clinical Impressions(s) / ED Diagnoses   Final diagnoses:  Alcohol abuse    ED Discharge Orders    None       Sherwood Gambler, MD 05/18/18 2251

## 2018-05-19 DIAGNOSIS — F1010 Alcohol abuse, uncomplicated: Secondary | ICD-10-CM

## 2018-05-19 MED ORDER — IBUPROFEN 200 MG PO TABS
400.0000 mg | ORAL_TABLET | Freq: Once | ORAL | Status: AC
  Administered 2018-05-19: 400 mg via ORAL
  Filled 2018-05-19: qty 2

## 2018-05-19 NOTE — Discharge Instructions (Addendum)
To help you maintain a sober lifestyle, a substance abuse treatment program may be beneficial to you.  Contact one of the following facilities at your earliest opportunity to ask about enrolling: ° °RESIDENTIAL PROGRAMS: ° °     [facility] °     [address] °     [place], [place] [address] °     [phone] ° °     [facility] °     [address] °     [place], [place] [address] °     [phone] ° °     [facility] °     [address] °     [place], [place] [address] °     [phone] ° °OUTPATIENT PROGRAMS: ° °     [facility] °     [address] °     [place], [place] [address] °     [phone] °     New patients are seen at the walk-in clinic every Tuesday from 9:00 am - 12:00 pm °

## 2018-05-19 NOTE — BH Assessment (Signed)
[facility] Assessment Progress Note  Per Jacqueline Norman, DO, this pt does not require psychiatric hospitalization at this time.  Pt is to be discharged from [facility] with referral information for area substance abuse treatment providers.  This has been included in pt's discharge instructions.  Pt would also benefit from seeing Peer Support Specialists; they will be asked to speak to pt.  Pt's nurse has been notified.  Joey Lierman, MA Triage Specialist [phone]     

## 2018-05-19 NOTE — BH Assessment (Signed)
[facility] Assessment Progress Note     05/19/18 Per Leilani Merl, DO and [name], FNP.  Patient has been psych cleared for discharge after he has been seen by peer support.
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
## 2018-05-19 NOTE — ED Notes (Signed)
Medicated for CIWA 11 ativan 2 mg po.

## 2018-05-19 NOTE — Patient Outreach (Signed)
ED Peer Support Specialist Patient Intake (Complete at intake & 30-60 Day Follow-up)  Name: Anthony [name]  MRN: [id number]  Age: 62 y.o.   Date of Admission: 05/19/2018  Intake: Initial Comments:      Primary Reason Admitted: alcohol use, requesting help with detox  Lab values: Alcohol/ETOH: Positive Positive UDS? 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 Yes  Number of ED visits: 5  Reason(s) for visit: alcohol use, alcohol withdrawal syndrome, altered mental status, DTs, fall   In past year, has patient been hospitalized for any reason? Yes  Number of hospitalizations: 2  Reason(s) for hospitalization: altered mental status, DTs, alcohol withdrawal syndrome   In past year, has patient been arrested? Yes  Number of arrests: 2  Reason(s) for arrest: improper 911 calls, open container, trespassing  In past year, has patient been incarcerated? Yes  Number of incarcerations: 2  Reason(s) for incarceration: improper 911 calls, open container, trespassing   In past year, has patient received medication-assisted treatment? No  In past year, patient received the following treatments: Residential treatment (non-hospital)([facility] about one month ago)  In past year, has patient received any harm reduction services? No  Did this include any of the following?    In past year, has patient received care from a mental health provider for diagnosis other than SUD?  Yes  In past year, is this first time patient has overdosed? (has not overdosed)  Number of past overdoses:    In past year, is this first time patient has been hospitalized for an overdose? (has not overdosed )  Number of hospitalizations for overdose(s):    Is patient currently receiving treatment for a mental health diagnosis? No  Patient reports experiencing difficulty participating in SUD treatment: No    Most important reason(s) for this difficulty?    Has patient received prior services for treatment? No  In past, patient has received services from following agencies:    Plan of Care:  Suggested follow up at these agencies/treatment centers: Other (comment)(Patient is interested in getting connected to recovery resources including non 12-step support groups. [facility] will provide information for those resources. )  Other information: CPSS met with the patient to provide substance use recovery support and help with recovery resources. CPSS talked to the patient about posibibly getting connected to a longer term detox center for about 3-5 days, but patient stated he does not feel like he needs detox referral at this time.  Patient interested in substance use recovery support groups that are not 12 step related. CPSS will provide information for those support groups. CPSS will provide recovery resources including residential/outpatient treatment center list, support group information, and [facility] contact information. CSW will help patient with providing homeless shelter resources.    Mason Jim, CPSS  05/19/2018 12:08 PM

## 2018-05-19 NOTE — Consult Note (Addendum)
[facility] ED Discharge  05/19/2018 11:41 AM Anthony [name]  MRN:  [id number] Principal Problem: Alcohol abuse Discharge Diagnoses:  Patient Active Problem List   Diagnosis Date Noted  . Alcohol abuse [F10.10] 09/16/2006    Priority: High  . Tooth decay [K02.9] 04/25/2018  . Anxiety state [F41.1] 04/25/2018  . Need for immunization against influenza [Z23] 04/25/2018  . Lightheadedness [R42] 11/28/2017  . Pancytopenia ([place]) [D61.818]   . Alcohol withdrawal ([place]) [F10.239] 09/01/2017  . Delirium tremens ([place]) [F10.231] 09/01/2017  . Altered mental status [R41.82]   . Dyspnea [R06.00] 11/27/2016  . Actinic keratosis [L57.0] 11/27/2016  . Macrocytic anemia [D53.9] 11/23/2016  . Dry eye [K06.770] 11/23/2016  . Right pulmonary embolus ([place]) [I26.99] 11/02/2016  . Hiatal hernia [K44.9] 09/14/2016  . Skin cancer [C44.90] 08/13/2016  . Toe pain [M79.676] 07/07/2013  . Poor social situation [Z65.9] 06/09/2013  . Tinea versicolor [B36.0] 06/08/2013  . Trigger finger, acquired [M65.30] 11/18/2012  . 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 Seborrheic dermatitis of scalp [L21.9] 11/08/2012  . HTN (hypertension) [I10] 04/11/2012  . Chronic low back pain [M54.5, G89.29] 09/16/2006    Subjective: Pt was seen and chart reviewed with treatment team and Dr Mariea Clonts. Pt denies suicidal/homicidal ideation, denies auditory/visual hallucinations and does not appear to be responding to internal stimuli. Pt presented to the [facility] requesting detox from alcohol. Pt was seen by TTS and psychiatry and stated he wants to detox from alcohol. Pt's UDS negative and BAL 369 on admission. Pt was referred to Peer Support for assistance with substance abuse resources in the community. Pt is followed by [facility] for medication management. Pt was seen at [facility] on 05-03-18 for being intoxicated and found [place] the park. Pt is psychiatrically clear for discharge.   Total Time spent  with patient: 45 minutes  Past Psychiatric History: As above  Past Medical History:  Past Medical History:  Diagnosis Date  . Alcoholism ([place])   . Hydrocele    Surgically corrected  . Hypertension   . Lumbar compression fracture ([place])   . Skin cancer    exciced 2017   Past Surgical History:  Procedure Laterality Date  . HIATAL HERNIA REPAIR  2016  . HYDROCELE EXCISION / REPAIR  2010  . SKIN CANCER EXCISION Left    Excised 2017   Family History:  Family History  Problem Relation Age of Onset  . Heart attack Father        died at 64 years  . Dementia Father   . Stroke Father   . Cancer Sister    Family Psychiatric  History: As listed above.  Social History:  Social History   Substance and Sexual Activity  Alcohol Use Yes  . Alcohol/week: 84.0 standard drinks  . Types: 84 Cans of beer per week    Social History   Substance and Sexual Activity  Drug Use Yes  . Types: Marijuana   Comment: occasional   Social History   Socioeconomic History  . Marital status: Single    Spouse name: Not on file  . Number of children: Not on file  . Years of education: Not on file  . Highest education level: Not on file  Occupational History  . Occupation: unemployed  Social Needs  . Financial resource strain: Not on file  . Food insecurity:    Worry: Not on file    Inability: Not on file  . Transportation needs:  Medical: Not on file    Non-medical: Not on file  Tobacco Use  . Smoking status: Never Smoker  . Smokeless tobacco: Never Used  Substance and Sexual Activity  . Alcohol use: Yes    Alcohol/week: 84.0 standard drinks    Types: 84 Cans of beer per week  . Drug use: Yes    Types: Marijuana    Comment: occasional  . Sexual activity: Yes    Birth control/protection: None  Lifestyle  . Physical activity:    Days per week: Not on file    Minutes per session: Not on file  . Stress: Not on file  Relationships  . Social connections:    Talks on phone: Not on  file    Gets together: Not on file    Attends religious service: Not on file    Active member of club or organization: Not on file    Attends meetings of clubs or organizations: Not on file    Relationship status: Not on file  Other Topics Concern  . Not on file  Social History Narrative   Lives with a friend. Has children but hasn't met them in a while. He doesn't know where they live.     Has this patient used any form of tobacco in the last 30 days? (Cigarettes, Smokeless Tobacco, Cigars, and/or Pipes) Prescription not provided because: Patient does not use tobacco products.  Current Medications: Current Facility-Administered Medications  Medication Dose Route Frequency Provider Last Rate Last Dose  . alum & mag hydroxide-simeth (MAALOX/MYLANTA) 200-200-20 MG/5ML suspension 30 mL  30 mL Oral Q6H PRN Sherwood Gambler, MD      . amLODipine (NORVASC) tablet 5 mg  5 mg Oral Daily Sherwood Gambler, MD   5 mg at 05/19/18 0936  . folic acid (FOLVITE) tablet 1 mg  1 mg Oral Daily Sherwood Gambler, MD   1 mg at 05/19/18 2993  . hydrochlorothiazide (HYDRODIURIL) tablet 25 mg  25 mg Oral Daily Sherwood Gambler, MD   25 mg at 05/19/18 0939  . loratadine (CLARITIN) tablet 10 mg  10 mg Oral Daily Sherwood Gambler, MD   10 mg at 05/19/18 0939  . LORazepam (ATIVAN) injection 0-4 mg  0-4 mg Intravenous Q6H Sherwood Gambler, MD       Or  . LORazepam (ATIVAN) tablet 0-4 mg  0-4 mg Oral Q6H Sherwood Gambler, MD   2 mg at 05/19/18 7169  . [START ON 05/21/2018] LORazepam (ATIVAN) injection 0-4 mg  0-4 mg Intravenous Q12H Sherwood Gambler, MD       Or  . Derrill Memo ON 05/21/2018] LORazepam (ATIVAN) tablet 0-4 mg  0-4 mg Oral Q12H Sherwood Gambler, MD      . methocarbamol (ROBAXIN) tablet 750 mg  750 mg Oral Q8H PRN Sherwood Gambler, MD   750 mg at 05/19/18 6789  . multivitamin with minerals tablet 1 tablet  1 tablet Oral Once per day on Mon Thu [place], Nicki Reaper, MD   1 tablet at 05/19/18 0936  . nicotine (NICODERM CQ -  dosed in mg/24 hours) patch 21 mg  21 mg Transdermal Daily Sherwood Gambler, MD      . ondansetron [facility]) tablet 4 mg  4 mg Oral Q8H PRN Sherwood Gambler, MD      . pyridOXINE (VITAMIN B-6) tablet 25 mg  25 mg Oral Daily Sherwood Gambler, MD   25 mg at 05/19/18 0939  . sertraline (ZOLOFT) tablet 50 mg  50 mg Oral Daily Sherwood Gambler, MD   50  mg at 05/19/18 0936  . thiamine (VITAMIN B-1) tablet 100 mg  100 mg Oral Daily Sherwood Gambler, MD   100 mg at 05/19/18 0940   Or  . thiamine (B-1) injection 100 mg  100 mg Intravenous Daily Sherwood Gambler, MD      . thiamine (VITAMIN B-1) tablet 100 mg  100 mg Oral Daily Sherwood Gambler, MD      . vitamin B-12 (CYANOCOBALAMIN) tablet 1,000 mcg  1,000 mcg Oral Daily Sherwood Gambler, MD   1,000 mcg at 05/19/18 2952  . zolpidem (AMBIEN) tablet 5 mg  5 mg Oral QHS PRN Sherwood Gambler, MD   5 mg at 05/19/18 8413   Current Outpatient Medications  Medication Sig Dispense Refill  . acetaminophen (TYLENOL) 650 MG CR tablet Take 1 tablet (650 mg total) by mouth every 8 (eight) hours as needed for pain. (Patient taking differently: Take 1,300 mg by mouth 2 (two) times daily as needed for pain. ) 90 tablet 0  . cetirizine (ZYRTEC) 10 MG tablet Take 1 tablet (10 mg total) by mouth daily. 30 tablet 11  . cyanocobalamin ([facility] VITAMIN B12) 1000 MCG tablet Take 1 tablet (1,000 mcg total) by mouth daily. 90 tablet 0  . diphenhydramine-acetaminophen (TYLENOL PM) 25-500 MG TABS tablet Take 1 tablet by mouth at bedtime.    . ferrous sulfate 325 (65 FE) MG tablet Take 1 tablet (325 mg total) by mouth 2 (two) times a week. 30 tablet 0  . folic acid (FOLVITE) 1 MG tablet Take 1 tablet (1 mg total) by mouth daily. 30 tablet 0  . hydrochlorothiazide (HYDRODIURIL) 25 MG tablet Take 25 mg by mouth daily.  6  . methocarbamol (ROBAXIN) 750 MG tablet TAKE 1 TABLET (750 MG TOTAL) BY MOUTH EVERY 8 (EIGHT) HOURS AS NEEDED. 60 tablet 3  . Multiple Vitamin (MULTIVITAMIN WITH MINERALS) TABS  tablet Take 1 tablet by mouth 2 (two) times a week. 30 tablet 0  . sertraline (ZOLOFT) 100 MG tablet Take 0.5 tablets (50 mg total) by mouth daily. (Patient taking differently: Take 100 mg by mouth daily. ) 30 tablet 11  . thiamine (VITAMIN B-1) 100 MG tablet Take 1 tablet (100 mg total) by mouth daily. 60 tablet 11  . vitamin B-6 (PYRIDOXINE) 25 MG tablet Take 1 tablet (25 mg total) by mouth daily. 30 tablet 11  . amLODipine (NORVASC) 5 MG tablet Take 1 tablet (5 mg total) by mouth daily. (Patient not taking: Reported on 05/18/2018) 30 tablet 0  . cetaphil (CETAPHIL) lotion Apply topically daily. (Patient not taking: Reported on 05/03/2018) 236 mL 0  . hydroxypropyl methylcellulose / hypromellose (ISOPTO TEARS / GONIOVISC) 2.5 % ophthalmic solution Place 1 drop into both eyes as needed for dry eyes. (Patient not taking: Reported on 05/03/2018) 15 mL 12  . ketoconazole (NIZORAL) 2 % cream Apply topically 2 (two) times daily as needed for irritation. (Patient not taking: Reported on 05/03/2018) 15 g 0    Musculoskeletal: Strength & Muscle Tone: within normal limits Gait & Station: normal Patient leans: N/A  Psychiatric Specialty Exam: Physical Exam  Constitutional: He appears well-developed and well-nourished.  Psychiatric: He has a normal mood and affect. His behavior is normal. Judgment and thought content normal.    Review of Systems  Psychiatric/Behavioral: Positive for substance abuse.  All other systems reviewed and are negative.   Blood pressure (!) 156/110, pulse 96, temperature 98.1 F (36.7 C), temperature source Oral, resp. rate 18, weight 78.7 kg, SpO2 94 %.Body mass  index is 24.89 kg/m.  General Appearance: Disheveled  Eye Contact:  Good  Speech:  Clear and Coherent and Normal Rate  Volume:  Normal  Mood:  Depressed and Irritable  Affect:  Congruent and Depressed  Thought Process:  Coherent, Goal Directed and Linear  Orientation:  Full (Time, Place, and Person)  Thought  Content:  Logical  Suicidal Thoughts:  No  Homicidal Thoughts:  No  Memory:  Immediate;   Good Recent;   Fair Remote;   Fair  Judgement:  Fair  Insight:  Fair  Psychomotor Activity:  Normal  Concentration:  Concentration: Fair and Attention Span: Fair  Recall:  [employer] of Knowledge:  Fair  Language:  Good  Akathisia:  No  Handed:  Right  AIMS (if indicated):   N/A  Assets:  Communication Skills Desire for Improvement  ADL's:  Intact  Cognition:  WNL  Sleep:   N/A     Demographic Factors:  Male, Caucasian, Low socioeconomic status and Unemployed  Loss Factors: Financial problems/change in socioeconomic status  Historical Factors: Impulsivity  Risk Reduction Factors:   Sense of responsibility to family  Continued Clinical Symptoms:  Alcohol/Substance Abuse/Dependencies  Cognitive Features That Contribute To Risk:  Closed-mindedness    Suicide Risk:  Minimal: No identifiable suicidal ideation.  Patients presenting with no risk factors but with morbid ruminations; may be classified as minimal risk based on the severity of the depressive symptoms    Plan Of Care/Follow-up recommendations:  Activity:  as tolerated Diet:  Heart healthy  Disposition: Alcohol Abuse Take all medications as prescribed by any of your outpatient providers Keep all follow-up appointments as scheduled.  Do not consume alcohol or use illegal drugs while on prescription medications. Report any adverse effects from your medications to your primary care provider promptly.  In the event of recurrent symptoms or worsening symptoms, call 911, a crisis hotline, or go to the nearest emergency department for evaluation.  Follow up with Peer Support for substance abuse resources in the community.    Ethelene Hal, NP 05/19/2018, 11:41 AM   Patient seen face-to-face for psychiatric evaluation, chart reviewed and case discussed with the physician extender and developed treatment plan.  Reviewed the information documented and agree with the treatment plan.  Buford Dresser, DO 05/19/18 12:26 PM

## 2018-05-19 NOTE — ED Notes (Signed)
Pt found to have all his belongings including suit case inside room with him but was very cooperative with their removal once unit rules were explained.

## 2018-05-20 ENCOUNTER — Telehealth: Payer: Self-pay

## 2018-05-20 ENCOUNTER — Other Ambulatory Visit: Payer: Self-pay

## 2018-05-20 ENCOUNTER — Encounter (HOSPITAL_COMMUNITY): Payer: Self-pay | Admitting: Emergency Medicine

## 2018-05-20 ENCOUNTER — Emergency Department (HOSPITAL_COMMUNITY)
Admission: EM | Admit: 2018-05-20 | Discharge: 2018-05-21 | Disposition: A | Discharge status: Home / Self Care | Payer: Medicaid Other | Attending: Emergency Medicine | Admitting: Emergency Medicine

## 2018-05-20 DIAGNOSIS — I10 Essential (primary) hypertension: Secondary | ICD-10-CM | POA: Insufficient documentation

## 2018-05-20 DIAGNOSIS — Y908 Blood alcohol level of 240 mg/100 ml or more: Secondary | ICD-10-CM | POA: Insufficient documentation

## 2018-05-20 DIAGNOSIS — F10120 Alcohol abuse with intoxication, uncomplicated: Secondary | ICD-10-CM | POA: Insufficient documentation

## 2018-05-20 DIAGNOSIS — F1010 Alcohol abuse, uncomplicated: Secondary | ICD-10-CM

## 2018-05-20 DIAGNOSIS — F1290 Cannabis use, unspecified, uncomplicated: Secondary | ICD-10-CM | POA: Insufficient documentation

## 2018-05-20 DIAGNOSIS — F10920 Alcohol use, unspecified with intoxication, uncomplicated: Secondary | ICD-10-CM

## 2018-05-20 DIAGNOSIS — Z79899 Other long term (current) drug therapy: Secondary | ICD-10-CM | POA: Diagnosis not present

## 2018-05-20 LAB — CBC
HEMATOCRIT: 34.3 % — AB (ref 39.0–52.0)
Hemoglobin: 11.2 g/dL — ABNORMAL LOW (ref 13.0–17.0)
MCH: 32.3 pg (ref 26.0–34.0)
MCHC: 32.7 g/dL (ref 30.0–36.0)
MCV: 98.8 fL (ref 80.0–100.0)
NRBC: 0 % (ref 0.0–0.2)
PLATELETS: 323 10*3/uL (ref 150–400)
RBC: 3.47 MIL/uL — ABNORMAL LOW (ref 4.22–5.81)
RDW: 17 % — AB (ref 11.5–15.5)
WBC: 4 10*3/uL (ref 4.0–10.5)

## 2018-05-20 LAB — COMPREHENSIVE METABOLIC PANEL
ALT: 40 U/L (ref 0–44)
ANION GAP: 10 (ref 5–15)
AST: 60 U/L — AB (ref 15–41)
Albumin: 3.9 g/dL (ref 3.5–5.0)
Alkaline Phosphatase: 68 U/L (ref 38–126)
BILIRUBIN TOTAL: 0.4 mg/dL (ref 0.3–1.2)
BUN: 7 mg/dL — AB (ref 8–23)
CHLORIDE: 95 mmol/L — AB (ref 98–111)
CO2: 22 mmol/L (ref 22–32)
Calcium: 8.6 mg/dL — ABNORMAL LOW (ref 8.9–10.3)
Creatinine, Ser: 0.71 mg/dL (ref 0.61–1.24)
Glucose, Bld: 105 mg/dL — ABNORMAL HIGH (ref 70–99)
POTASSIUM: 3.9 mmol/L (ref 3.5–5.1)
Sodium: 127 mmol/L — ABNORMAL LOW (ref 135–145)
Total Protein: 7.4 g/dL (ref 6.5–8.1)

## 2018-05-20 LAB — ETHANOL: ALCOHOL ETHYL (B): 364 mg/dL — AB (ref <10)

## 2018-05-20 NOTE — ED Notes (Signed)
Bed: [facility] Expected date:  Expected time:  Means of arrival:  Comments: 

## 2018-05-20 NOTE — ED Provider Notes (Addendum)
[facility] DEPT Provider Note   CSN: [id number] Arrival date & time: 05/20/18  2021     History   Chief Complaint Chief Complaint  Patient presents with  . Alcohol Problem    HPI Anthony [name] is a 62 y.o. male.  Patient with history of alcoholism presents the emergency department with complaint of alcohol intoxication.  Patient was seen in the emergency department 2 days ago.  Patient had TTS consult and was given substance abuse referrals.  Patient was contacted by peers support.  He was also seen by social work and given Theatre stage manager.  Patient states that he was only in the hospital for a day and immediately began to drink after being discharged.  Brought in by mobile crisis tonight requesting alcohol detox.  Denies suicidal or homicidal ideations.  Denies history of seizures after alcohol cessation in the past.  He did have an admission for hallucinations in setting of alcohol detox past.  Patient otherwise denies any medical complaints.  No recent fevers, nausea, vomiting, diarrhea.  No chest pain or shortness of breath.  No abdominal pain.      Past Medical History:  Diagnosis Date  . Alcoholism ([place])   . Hydrocele    Surgically corrected  . Hypertension   . Lumbar compression fracture ([place])   . Skin cancer    exciced 2017    Patient Active Problem List   Diagnosis Date Noted  . Tooth decay 04/25/2018  . Anxiety state 04/25/2018  . Need for immunization against influenza 04/25/2018  . Lightheadedness 11/28/2017  . Pancytopenia ([place])   . Alcohol withdrawal ([place]) 09/01/2017  . Delirium tremens ([place]) 09/01/2017  . Altered mental status   . Dyspnea 11/27/2016  . Actinic keratosis 11/27/2016  . Macrocytic anemia 11/23/2016  . Dry eye 11/23/2016  . Right pulmonary embolus ([place]) 11/02/2016  . Hiatal hernia 09/14/2016  . Skin cancer 08/13/2016  . Toe pain 07/07/2013  . Poor social situation 06/09/2013  . Tinea versicolor  06/08/2013  . Trigger finger, acquired 11/18/2012  . 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 HTN (hypertension) 04/11/2012  . Alcohol abuse 09/16/2006  . Chronic low back pain 09/16/2006    Past Surgical History:  Procedure Laterality Date  . HIATAL HERNIA REPAIR  2016  . HYDROCELE EXCISION / REPAIR  2010  . SKIN CANCER EXCISION Left    Excised 2017        Home Medications    Prior to Admission medications   Medication Sig Start Date End Date Taking? Authorizing Provider  acetaminophen (TYLENOL) 650 MG CR tablet Take 1 tablet (650 mg total) by mouth every 8 (eight) hours as needed for pain. Patient taking differently: Take 1,300 mg by mouth 2 (two) times daily as needed for pain.  01/07/18   Raiford Noble Latif, DO  amLODipine (NORVASC) 5 MG tablet Take 1 tablet (5 mg total) by mouth daily. Patient not taking: Reported on 05/18/2018 01/08/18   Raiford Noble Latif, DO  cetaphil (CETAPHIL) lotion Apply topically daily. Patient not taking: Reported on 05/03/2018 09/06/17   Bufford Lope, DO  cetirizine (ZYRTEC) 10 MG tablet Take 1 tablet (10 mg total) by mouth daily. 01/07/18   Raiford Noble Latif, DO  cyanocobalamin ([facility]S VITAMIN B12) 1000 MCG tablet Take 1 tablet (1,000 mcg total) by mouth daily. 01/07/18   Sheikh, Omair Latif, DO  diphenhydramine-acetaminophen (TYLENOL PM) 25-500 MG TABS tablet Take 1  tablet by mouth at bedtime.    [provider]  ferrous sulfate 325 (65 FE) MG tablet Take 1 tablet (325 mg total) by mouth 2 (two) times a week. 01/10/18   Raiford Noble Latif, DO  folic acid (FOLVITE) 1 MG tablet Take 1 tablet (1 mg total) by mouth daily. 01/08/18   Raiford Noble Latif, DO  hydrochlorothiazide (HYDRODIURIL) 25 MG tablet Take 25 mg by mouth daily. 03/18/18   [provider]  hydroxypropyl methylcellulose / hypromellose (ISOPTO TEARS / GONIOVISC) 2.5 % ophthalmic solution Place 1 drop into both eyes as  needed for dry eyes. Patient not taking: Reported on 05/03/2018 01/07/18   Raiford Noble Latif, DO  ketoconazole (NIZORAL) 2 % cream Apply topically 2 (two) times daily as needed for irritation. Patient not taking: Reported on 05/03/2018 04/22/18   Kathrene Alu, MD  methocarbamol (ROBAXIN) 750 MG tablet TAKE 1 TABLET (750 MG TOTAL) BY MOUTH EVERY 8 (EIGHT) HOURS AS NEEDED. 04/25/18   Kathrene Alu, MD  Multiple Vitamin (MULTIVITAMIN WITH MINERALS) TABS tablet Take 1 tablet by mouth 2 (two) times a week. 01/10/18   Raiford Noble Latif, DO  sertraline (ZOLOFT) 100 MG tablet Take 0.5 tablets (50 mg total) by mouth daily. Patient taking differently: Take 100 mg by mouth daily.  04/22/18   Kathrene Alu, MD  thiamine (VITAMIN B-1) 100 MG tablet Take 1 tablet (100 mg total) by mouth daily. 03/22/18   Kathrene Alu, MD  vitamin B-6 (PYRIDOXINE) 25 MG tablet Take 1 tablet (25 mg total) by mouth daily. 04/27/18   Kathrene Alu, MD    Family History Family History  Problem Relation Age of Onset  . Heart attack Father        died at 25 years  . Dementia Father   . Stroke Father   . Cancer Sister     Social History Social History   Tobacco Use  . Smoking status: Never Smoker  . Smokeless tobacco: Never Used  Substance Use Topics  . Alcohol use: Yes    Alcohol/week: 84.0 standard drinks    Types: 84 Cans of beer per week  . Drug use: Yes    Types: Marijuana    Comment: occasional     Allergies   Patient has no known allergies.   Review of Systems Review of Systems  Constitutional: Negative for fever.  HENT: Negative for rhinorrhea and sore throat.   Eyes: Negative for redness.  Respiratory: Negative for cough.   Cardiovascular: Negative for chest pain.  Gastrointestinal: Negative for abdominal pain, diarrhea, nausea and vomiting.  Genitourinary: Negative for dysuria.  Musculoskeletal: Negative for myalgias.  Skin: Negative for rash.  Neurological: Negative for  headaches.     Physical Exam Updated Vital Signs BP 135/90   Pulse (!) 105   Temp 97.7 F (36.5 C) (Oral)   Resp 17   SpO2 99%   Physical Exam  Constitutional: He is oriented to person, place, and time. He appears well-developed and well-nourished.  HENT:  Head: Normocephalic and atraumatic.  Mouth/Throat: Oropharynx is clear and moist.  Eyes: Conjunctivae are normal. Right eye exhibits no discharge. Left eye exhibits no discharge.  Neck: Normal range of motion. Neck supple.  Cardiovascular: Normal rate, regular rhythm and normal heart sounds.  Pulmonary/Chest: Effort normal and breath sounds normal.  Abdominal: Soft. There is no tenderness.  Neurological: He is alert and oriented to person, place, and time.  Speech is slurred consistent with alcohol intoxication.  Odor of alcohol.  Skin: Skin is warm and dry.  Psychiatric: He has a normal mood and affect.  Nursing note and vitals reviewed.    ED Treatments / Results  Labs (all labs ordered are listed, but only abnormal results are displayed) Labs Reviewed  CBC - Abnormal; Notable for the following components:      Result Value   RBC 3.47 (*)    Hemoglobin 11.2 (*)    HCT 34.3 (*)    RDW 17.0 (*)    All other components within normal limits  COMPREHENSIVE METABOLIC PANEL - Abnormal; Notable for the following components:   Sodium 127 (*)    Chloride 95 (*)    Glucose, Bld 105 (*)    BUN 7 (*)    Calcium 8.6 (*)    AST 60 (*)    All other components within normal limits  ETHANOL - Abnormal; Notable for the following components:   Alcohol, Ethyl (B) 364 (*)    All other components within normal limits    EKG None  Radiology No results found.  Procedures Procedures (including critical care time)  Medications Ordered in ED Medications - No data to display   Initial Impression / Assessment and Plan / ED Course  I have reviewed the triage vital signs and the nursing notes.  Pertinent labs & imaging  results that were available during my care of the patient were reviewed by me and considered in my medical decision making (see chart for details).  Clinical Course as of May 27 1835  Sat May 21, 2018  0200 Baseline  Hemoglobin(!): 11.2 [HM]  0200 Noted in conjunction with hypochloremia.  Likely secondary to high alcohol content.    Sodium(!): 127 [HM]  0200 Alcohol, Ethyl (B)(!!): 364 [HM]  0413 Patient is alert and oriented.  He is tolerating p.o.  He has been ambulatory here in the emergency department without difficulty.  Patient with steady gait.   [HM]  0413 Patient specifically questioned about suicidal ideation.  He denies at this time.  He denies homicidal ideation, auditory or visual hallucinations.  Record review shows that patient was recently evaluated for suicidal ideation and cleared by psyc.   [HM]    Clinical Course User Index [HM] [place], Jarrett [place], [place]    Patient seen and examined.  Reviewed previous ED visit.  Patient had extensive resources given to him at previous visit.    Vital signs reviewed and are as follows: BP 135/90   Pulse (!) 105   Temp 97.7 F (36.5 C) (Oral)   Resp 17   SpO2 99%   11:34 PM Alcohol is very high. Will be allowed to metabolize. Hyponatremia and hypochloremia likely 2/2 excessive alcohol consumption. Anticipate d/c when safe.   Patient rechecked. Still obviously intoxicated. He states he is getting a coke and a sandwich.   Handoff to Law PA-C at shift change.   Final Clinical Impressions(s) / ED Diagnoses   Final diagnoses:  Alcoholic intoxication without complication ([place])   Pt with alcohol intoxication. No SI voiced. No withdrawal 2/2 acute intoxication. No indications for admission.   ED Discharge Orders    None       Carlisle Cater, PA-C 05/20/18 2344    Carlisle Cater, PA-C 05/26/18 1836    Hayden Rasmussen, MD 05/27/18 [phone]

## 2018-05-20 NOTE — ED Notes (Signed)
CRITICAL VALUE STICKER  CRITICAL VALUE: Alc 364  RECEIVER (on-site recipient of call): Jake T RN  [place] NOTIFIED: 11/1 943p  MESSENGER (representative from lab): Merrilyn Puma  MD NOTIFIED: Rondel Oh PA  TIME OF NOTIFICATION: 944p  RESPONSE: see orders

## 2018-05-20 NOTE — Telephone Encounter (Signed)
Patient called nurse line. States his back pain is very bad and is has been drinking. Stated he needs help finding an inpatient/residential facility to help him with his alcoholism but he does not want one associated with a 12 step program as he resents that b/c he does not believe in God. He can't get past step 2 where he has to profess faith in God so he is told he can't recover unless he says this.  Patient denies SI/HI. Has spoken with LCSW at [facility] in past and has had two recent ED visits.  Will route to LCSW, Dr Gwenlyn Saran, and PCP for further advice.  Patient number is [phone]  Danley Danker, RN [facility] [place])

## 2018-05-20 NOTE — ED Triage Notes (Addendum)
Patient BIB mobile crisis, requesting alcohol detox. Reports last drink approximately one hour ago. Denies SI/HI/A/V/H.

## 2018-05-21 NOTE — Discharge Instructions (Addendum)
1. Medications: usual home medications 2. Treatment: rest, drink plenty of fluids,  3. Follow Up: Please followup with your primary doctor in 1-2 days for discussion of your diagnoses and further evaluation after today's visit; if you do not have a primary care doctor use the resource guide provided to find one; Please return to the ER for any symptoms, difficulty walking, seizures, persistent vomiting or other concerns.

## 2018-05-21 NOTE — ED Provider Notes (Signed)
Care assumed from Armstead Peaks, PA-C, (who received pt from Carlisle Cater, PA-C0.  Please see his full H&P.  In short,  Anthony [name] is a 62 y.o. male presents for alcohol intoxication.  He was seen in the emergency department 2 days ago for similar complaints.  At that time he had a TTS consult and was given substance abuse referrals.  Patient reports that he immediately began to drink after being discharged.  Tonight he arrives via mobile crisis requesting alcohol detox.  On initial evaluation he denied suicidal or homicidal ideations.  Denied a history of seizures after alcohol cessation.  Patient did have admission for hallucinations in the setting of alcohol detox in the past.  Physical Exam  BP 135/90   Pulse (!) 105   Temp 97.7 F (36.5 C) (Oral)   Resp 17   SpO2 99%   Physical Exam  Constitutional: He appears well-developed and well-nourished. No distress.  HENT:  Head: Normocephalic.  Eyes: Conjunctivae are normal. No scleral icterus.  Neck: Normal range of motion.  Cardiovascular: Normal rate and intact distal pulses.  Radial pulses strong, regular and without tachycardia.  Pulmonary/Chest: Effort normal.  Musculoskeletal: Normal range of motion.  Neurological: He is alert.  Skin: Skin is warm and dry.  Nursing note and vitals reviewed.   ED Course/Procedures   Clinical Course as of May 21 418  Sat May 21, 2018  0200 Baseline  Hemoglobin(!): 11.2 [HM]  0200 Noted in conjunction with hypochloremia.  Likely secondary to high alcohol content.    Sodium(!): 127 [HM]  0200 Alcohol, Ethyl (B)(!!): 364 [HM]  0413 Patient is alert and oriented.  He is tolerating p.o.  He has been ambulatory here in the emergency department without difficulty.  Patient with steady gait.   [HM]  0413 Patient specifically questioned about suicidal ideation.  He denies at this time.  He denies homicidal ideation, auditory or visual hallucinations.  Record review shows that patient was recently  evaluated for suicidal ideation and cleared by psyc.   [HM]    Clinical Course User Index [HM] Linda Biehn, Gwenlyn Perking    Procedures  MDM   Patient presents with acute alcohol intoxication and request for alcohol detox.  He has sobered here in the emergency department.  He is ambulatory without difficulty and tolerating p.o.  He is alert and oriented.  He denies suicidal ideation.  Vitals have improved since triage.  BP (!) 133/93 (BP Location: Left Arm)   Pulse 88   Temp 97.7 F (36.5 C) (Oral)   Resp 20   SpO2 94%   He is well-appearing, alert and oriented.  He will be discharged home with outpatient detox resources.  Discussed reasons to return immediately to the emergency department.  Patient states understanding and is in agreement with the plan.   Alcoholic intoxication without complication [facility])      Anne Sebring, Gwenlyn Perking 05/21/18 0603    Molpus, Jenny Reichmann, MD 05/21/18 [id number]

## 2018-05-24 NOTE — Telephone Encounter (Signed)
Mr. Molinelli spoke with our behavior health specialists for a long time at his previous visit with me about this topic.  He presented to the ED with alcohol intoxication a few days after that appointment.  I'm not sure what other options we can provide him now that we did not provide him at that point.  It looks like he has an appointment on November 7, so hopefully resources can again be addressed at that point.

## 2018-05-26 ENCOUNTER — Other Ambulatory Visit: Payer: Self-pay

## 2018-05-26 ENCOUNTER — Ambulatory Visit: Payer: Medicaid Other | Admitting: Family Medicine

## 2018-05-26 ENCOUNTER — Encounter: Payer: Self-pay | Admitting: Family Medicine

## 2018-05-26 DIAGNOSIS — F329 Major depressive disorder, single episode, unspecified: Secondary | ICD-10-CM | POA: Diagnosis not present

## 2018-05-26 DIAGNOSIS — F1010 Alcohol abuse, uncomplicated: Secondary | ICD-10-CM

## 2018-05-26 DIAGNOSIS — F32A Depression, unspecified: Secondary | ICD-10-CM

## 2018-05-26 DIAGNOSIS — F411 Generalized anxiety disorder: Secondary | ICD-10-CM | POA: Diagnosis present

## 2018-05-26 MED ORDER — SERTRALINE HCL 100 MG PO TABS: 150.0000 mg | ORAL_TABLET | Freq: Every day | ORAL | 3 refills | Status: DC

## 2018-05-26 NOTE — Assessment & Plan Note (Signed)
Contracted for safety.  We agreed on plan to increase zoloft.  See me in one month.

## 2018-05-26 NOTE — Assessment & Plan Note (Signed)
No argumentativeness with me.  Pleasant and cooperative.  Neither intoxicated nor withdrawing.  Admits to continued use of alcohol at 6 beers per day.

## 2018-05-26 NOTE — Patient Instructions (Signed)
I sent in your prescriptions.   Good luck with the dentist and the housing folks.

## 2018-05-26 NOTE — Assessment & Plan Note (Signed)
Increase zoloft.  Continue to work on [place] back EtOH.

## 2018-05-26 NOTE — Progress Notes (Signed)
   Subjective:    Patient ID: Anthony [name], male    DOB: 1956/01/08, 62 y.o.   MRN: [id number]  HPI Homeless.  Chronic alcohol abuse - states that he has cut back from hard liquor to beer - 6 pack per day.  Anxious.  Short/irritable with people.  Maybe has gotten some benefit from his zoloft 100 mg per day.  Hopes to get more symptom relief.  No HI or SI.   He is cautiously optimistic about the future.  He has an appointment next week with the housing authority and may get an apartment.  Also has an upcoming appointment with a dentist.    Depressed:  No SI or HI.  Admits that he does not have much to live for.  Again, has some optimism.  Review of Systems     Objective:   Physical Exam  Psych: Normal mood and mentation. Ext no jitteriness or other signs of withdrawal.  Not intoxicated during his 11:30 AM visit.   Scored high on PHQ9 /  Answered that he had thoughts of hurting self, but denied to me and contracted for safety.        Assessment & Plan:

## 2018-06-07 ENCOUNTER — Other Ambulatory Visit: Payer: Self-pay

## 2018-06-07 ENCOUNTER — Encounter (HOSPITAL_COMMUNITY): Payer: Self-pay

## 2018-06-07 ENCOUNTER — Emergency Department (HOSPITAL_COMMUNITY)
Admission: EM | Admit: 2018-06-07 | Discharge: 2018-06-07 | Disposition: A | Discharge status: Home / Self Care | Payer: Medicaid Other | Attending: Emergency Medicine | Admitting: Emergency Medicine

## 2018-06-07 DIAGNOSIS — R600 Localized edema: Secondary | ICD-10-CM

## 2018-06-07 DIAGNOSIS — Z85828 Personal history of other malignant neoplasm of skin: Secondary | ICD-10-CM | POA: Insufficient documentation

## 2018-06-07 DIAGNOSIS — I10 Essential (primary) hypertension: Secondary | ICD-10-CM | POA: Diagnosis not present

## 2018-06-07 DIAGNOSIS — Z79899 Other long term (current) drug therapy: Secondary | ICD-10-CM | POA: Insufficient documentation

## 2018-06-07 DIAGNOSIS — M545 Low back pain, unspecified: Secondary | ICD-10-CM

## 2018-06-07 DIAGNOSIS — G8929 Other chronic pain: Secondary | ICD-10-CM | POA: Diagnosis not present

## 2018-06-07 DIAGNOSIS — R2240 Localized swelling, mass and lump, unspecified lower limb: Secondary | ICD-10-CM | POA: Insufficient documentation

## 2018-06-07 MED ORDER — LIDOCAINE 5 % EX PTCH
1.0000 | MEDICATED_PATCH | CUTANEOUS | Status: DC
  Administered 2018-06-07: 1 via TRANSDERMAL
  Filled 2018-06-07: qty 1

## 2018-06-07 MED ORDER — ACETAMINOPHEN 325 MG PO TABS
650.0000 mg | ORAL_TABLET | Freq: Once | ORAL | Status: AC
  Administered 2018-06-07: 650 mg via ORAL
  Filled 2018-06-07: qty 2

## 2018-06-07 NOTE — ED Provider Notes (Signed)
[place] DEPT Provider Note   CSN: [id number] Arrival date & time: 06/07/18  1637     History   Chief Complaint Chief Complaint  Patient presents with  . Back Pain    HPI Anthony [name] is a 62 y.o. male.  Anthony [name] is a 62 y.o. Male with a history of hypertension, lumbar compression fracture and chronic back pain, and alcoholism, who presents for evaluation of low back pain.  Patient presents with EMS after he was arrested by police for outstanding court date for trespassing, once arrested he began complaining of this pain.  He reports it radiates across his low back.  He denies any associated numbness or weakness, no saddle anesthesia no loss of bowel or bladder control.  No new injury or trauma to the area, no recent falls.  Patient reports that he does not have anywhere to stay currently and has been sleeping outside in the cold and thinks this is what his back pain is worse.  He denies any abdominal pain, nausea, vomiting, dysuria, fevers or chills.  He took 1 dose of a muscle relaxer with some relief today.  He also reports he has noted some mild swelling in both legs which occurs intermittently, he has not tried anything to help with the swelling, thinks it may be due to him up walking around on his feet more frequently.  He denies any associated chest pain or shortness of breath.     Past Medical History:  Diagnosis Date  . Alcoholism ([place])   . Hydrocele    Surgically corrected  . Hypertension   . Lumbar compression fracture ([place])   . Skin cancer    exciced 2017    Patient Active Problem List   Diagnosis Date Noted  . Depression 05/26/2018  . Tooth decay 04/25/2018  . Anxiety state 04/25/2018  . Need for immunization against influenza 04/25/2018  . Lightheadedness 11/28/2017  . Pancytopenia ([place])   . Alcohol withdrawal ([place]) 09/01/2017  . Delirium tremens ([place]) 09/01/2017  . Altered mental status   . Dyspnea 11/27/2016  .  Actinic keratosis 11/27/2016  . Macrocytic anemia 11/23/2016  . Dry eye 11/23/2016  . Right pulmonary embolus ([place]) 11/02/2016  . Hiatal hernia 09/14/2016  . Skin cancer 08/13/2016  . Toe pain 07/07/2013  . Poor social situation 06/09/2013  . Tinea versicolor 06/08/2013  . Trigger finger, acquired 11/18/2012  . 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 HTN (hypertension) 04/11/2012  . Alcohol abuse 09/16/2006  . Chronic low back pain 09/16/2006    Past Surgical History:  Procedure Laterality Date  . HIATAL HERNIA REPAIR  2016  . HYDROCELE EXCISION / REPAIR  2010  . SKIN CANCER EXCISION Left    Excised 2017        Home Medications    Prior to Admission medications   Medication Sig Start Date End Date Taking? Authorizing Provider  acetaminophen (TYLENOL) 650 MG CR tablet Take 1 tablet (650 mg total) by mouth every 8 (eight) hours as needed for pain. Patient taking differently: Take 1,300 mg by mouth 2 (two) times daily as needed for pain.  01/07/18   Raiford Noble Latif, DO  amLODipine (NORVASC) 5 MG tablet Take 1 tablet (5 mg total) by mouth daily. Patient not taking: Reported on 05/18/2018 01/08/18   Raiford Noble Latif, DO  cetaphil (CETAPHIL) lotion Apply topically daily. Patient not taking: Reported on 05/03/2018 09/06/17  Bufford Lope, DO  cetirizine (ZYRTEC) 10 MG tablet Take 1 tablet (10 mg total) by mouth daily. 01/07/18   Raiford Noble Latif, DO  cyanocobalamin ([employer] VITAMIN B12) 1000 MCG tablet Take 1 tablet (1,000 mcg total) by mouth daily. 01/07/18   Raiford Noble Latif, DO  ferrous sulfate 325 (65 FE) MG tablet Take 1 tablet (325 mg total) by mouth 2 (two) times a week. 01/10/18   Raiford Noble Latif, DO  folic acid (FOLVITE) 1 MG tablet Take 1 tablet (1 mg total) by mouth daily. 01/08/18   Raiford Noble Latif, DO  hydrochlorothiazide (HYDRODIURIL) 25 MG tablet Take 25 mg by mouth daily. 03/18/18   [provider]  hydroxypropyl methylcellulose / hypromellose (ISOPTO TEARS / GONIOVISC) 2.5 % ophthalmic solution Place 1 drop into both eyes as needed for dry eyes. Patient not taking: Reported on 05/03/2018 01/07/18   Raiford Noble Latif, DO  ketoconazole (NIZORAL) 2 % cream Apply topically 2 (two) times daily as needed for irritation. Patient not taking: Reported on 05/03/2018 04/22/18   Kathrene Alu, MD  methocarbamol (ROBAXIN) 750 MG tablet TAKE 1 TABLET (750 MG TOTAL) BY MOUTH EVERY 8 (EIGHT) HOURS AS NEEDED. 04/25/18   Kathrene Alu, MD  Multiple Vitamin (MULTIVITAMIN WITH MINERALS) TABS tablet Take 1 tablet by mouth 2 (two) times a week. 01/10/18   Raiford Noble Latif, DO  sertraline (ZOLOFT) 100 MG tablet Take 1.5 tablets (150 mg total) by mouth daily. For 4 weeks then 200 mg per day thereafter 05/26/18   Zenia Resides, MD  thiamine (VITAMIN B-1) 100 MG tablet Take 1 tablet (100 mg total) by mouth daily. 03/22/18   Kathrene Alu, MD  vitamin B-6 (PYRIDOXINE) 25 MG tablet Take 1 tablet (25 mg total) by mouth daily. 04/27/18   Kathrene Alu, MD    Family History Family History  Problem Relation Age of Onset  . Heart attack Father        died at 78 years  . Dementia Father   . Stroke Father   . Cancer Sister     Social History Social History   Tobacco Use  . Smoking status: Never Smoker  . Smokeless tobacco: Never Used  Substance Use Topics  . Alcohol use: Yes    Alcohol/week: 84.0 standard drinks    Types: 84 Cans of beer per week    Comment: daily use- 2 40 ounce beers daily and whiskey when he has it.  . Drug use: Yes    Types: Marijuana    Comment: occasional     Allergies   Patient has no known allergies.   Review of Systems Review of Systems  Constitutional: Negative for chills and fever.  HENT: Negative.   Respiratory: Negative for shortness of breath.   Cardiovascular: Positive for leg swelling. Negative for chest pain.  Gastrointestinal:  Negative for abdominal pain, constipation, diarrhea, nausea and vomiting.  Genitourinary: Negative for dysuria, flank pain, frequency and hematuria.  Musculoskeletal: Positive for back pain. Negative for arthralgias, gait problem, joint swelling, myalgias and neck pain.  Skin: Negative for color change, rash and wound.  Neurological: Negative for weakness and numbness.     Physical Exam Updated Vital Signs BP (!) 150/104   Pulse 100   Temp 98 F (36.7 C) (Oral)   Resp 18   Ht 5\' 10"  (1.778 m)   Wt 77.1 kg   SpO2 100%   BMI 24.39 kg/m   Physical Exam  Constitutional: He  is oriented to person, place, and time. He appears well-developed and well-nourished. No distress.  HENT:  Head: Atraumatic.  Eyes: Right eye exhibits no discharge. Left eye exhibits no discharge.  Neck: Neck supple.  Cardiovascular: Normal rate, regular rhythm, normal heart sounds and intact distal pulses. Exam reveals no gallop and no friction rub.  No murmur heard. Pulses:      Radial pulses are 2+ on the right side, and 2+ on the left side.       Dorsalis pedis pulses are 2+ on the right side, and 2+ on the left side.       Posterior tibial pulses are 2+ on the right side, and 2+ on the left side.  Pulmonary/Chest: Effort normal and breath sounds normal. No respiratory distress.  Respirations equal and unlabored, patient able to speak in full sentences, lungs clear to auscultation bilaterally  Abdominal: Soft. Bowel sounds are normal. He exhibits no distension and no mass. There is no tenderness. There is no guarding.  Abdomen soft, nondistended, nontender to palpation in all quadrants without guarding or peritoneal signs, no CVA tenderness bilaterally  Musculoskeletal:  Tenderness to palpation over lower back, without focal pain or deformity.  Pain made worse with range of motion of the lower extremities 1+ edema of bilateral lower extremities up to the midshin, no erythema or warmth  Neurological: He is  alert and oriented to person, place, and time.  Alert, clear speech, following commands. Moving all extremities without difficulty. Bilateral lower extremities with 5/5 strength in proximal and distal muscle groups and with dorsi and plantar flexion. Sensation intact in bilateral lower extremities. 2+ patellar DTRs bilaterally. Ambulatory with steady gait  Skin: Skin is warm and dry. Capillary refill takes less than 2 seconds. He is not diaphoretic.  Psychiatric: He has a normal mood and affect. His behavior is normal.  Nursing note and vitals reviewed.    ED Treatments / Results  Labs (all labs ordered are listed, but only abnormal results are displayed) Labs Reviewed - No data to display  EKG None  Radiology No results found.  Procedures Procedures (including critical care time)  Medications Ordered in ED Medications  lidocaine (LIDODERM) 5 % 1 patch (has no administration in time range)  acetaminophen (TYLENOL) tablet 650 mg (has no administration in time range)     Initial Impression / Assessment and Plan / ED Course  I have reviewed the triage vital signs and the nursing notes.  Pertinent labs & imaging results that were available during my care of the patient were reviewed by me and considered in my medical decision making (see chart for details).  Patient with back pain.  Has history of chronic back pain since injury several years ago.  No neurological deficits and normal neuro exam.  Patient is ambulatory in the department without difficulty.  No loss of bowel or bladder control.  No concern for cauda equina.  No fever, night sweats, weight loss, h/o cancer, IVDU.  No new injury or trauma to the back, patient reports he has been sleeping outdoors in the cold and thinks this is why his pain is worse, he took a muscle relaxer today he has not tried anything else, will provide patient with dose of Tylenol and Lidoderm patch.  Patient also reports some mild lower extremity  weakness with no associated chest pain or shortness of breath he has not tried anything to help with this, thinks it may be because he is been up on his feet  walking more, I have encouraged him to elevate his legs and use compression stockings and follow-up with primary care if it still not improving.  Patient presented for evaluation of this back pain after being arrested for trespassing, and is requesting to be admitted repeatedly so he can get some sleep.  I have explained to the patient that he does not appear to have an emergent medical condition further evaluation at this time and will be discharged, to which he expresses concern, reporting that there is no reason he should not be able to stay in the hospital overnight one night so that he does not have to sleep outside.  Patient exhibits multiple reasons for secondary gain, but does not have any acute medical issue requiring further evaluation or admission to the hospital at this time.  Will be discharged home.   Final Clinical Impressions(s) / ED Diagnoses   Final diagnoses:  Chronic bilateral low back pain without sciatica  Lower extremity edema    ED Discharge Orders    None       Jacqlyn Larsen, [place] 06/07/18 1826    Daleen Bo, MD 06/10/18 1520

## 2018-06-07 NOTE — ED Triage Notes (Signed)
Per EMS, states patient turned himself in for trespassing-when police decided to take him to jail he complained of back pain-history of chronic back pain

## 2018-06-07 NOTE — Discharge Instructions (Signed)
These follow-up with your primary care doctor for continued management of your chronic back pain.  Return to the emergency department if you have new weakness, numbness, loss of control of your bowels or bladder or any other new or concerning symptoms.  For leg swelling please use compression stockings and elevate your legs as much as possible and follow-up with your primary care doctor for this if it is not improving.

## 2018-07-18 ENCOUNTER — Other Ambulatory Visit: Payer: Self-pay

## 2018-07-18 ENCOUNTER — Encounter (HOSPITAL_COMMUNITY): Payer: Self-pay | Admitting: Emergency Medicine

## 2018-07-18 ENCOUNTER — Emergency Department (HOSPITAL_COMMUNITY)
Admission: EM | Admit: 2018-07-18 | Discharge: 2018-07-18 | Disposition: A | Discharge status: Home / Self Care | Payer: Medicaid Other | Attending: Emergency Medicine | Admitting: Emergency Medicine

## 2018-07-18 DIAGNOSIS — I10 Essential (primary) hypertension: Secondary | ICD-10-CM | POA: Diagnosis not present

## 2018-07-18 DIAGNOSIS — Z79899 Other long term (current) drug therapy: Secondary | ICD-10-CM | POA: Insufficient documentation

## 2018-07-18 DIAGNOSIS — M545 Low back pain, unspecified: Secondary | ICD-10-CM

## 2018-07-18 DIAGNOSIS — F1014 Alcohol abuse with alcohol-induced mood disorder: Secondary | ICD-10-CM | POA: Diagnosis present

## 2018-07-18 DIAGNOSIS — F1020 Alcohol dependence, uncomplicated: Secondary | ICD-10-CM

## 2018-07-18 DIAGNOSIS — Z85828 Personal history of other malignant neoplasm of skin: Secondary | ICD-10-CM | POA: Insufficient documentation

## 2018-07-18 DIAGNOSIS — Z590 Homelessness: Secondary | ICD-10-CM | POA: Diagnosis not present

## 2018-07-18 DIAGNOSIS — F1010 Alcohol abuse, uncomplicated: Secondary | ICD-10-CM | POA: Insufficient documentation

## 2018-07-18 DIAGNOSIS — F329 Major depressive disorder, single episode, unspecified: Secondary | ICD-10-CM | POA: Diagnosis present

## 2018-07-18 DIAGNOSIS — G8929 Other chronic pain: Secondary | ICD-10-CM | POA: Insufficient documentation

## 2018-07-18 DIAGNOSIS — F1914 Other psychoactive substance abuse with psychoactive substance-induced mood disorder: Secondary | ICD-10-CM | POA: Diagnosis not present

## 2018-07-18 HISTORY — DX: Alcohol abuse with alcohol-induced mood disorder: F10.14

## 2018-07-18 LAB — COMPREHENSIVE METABOLIC PANEL
ALBUMIN: 3.5 g/dL (ref 3.5–5.0)
ALT: 18 U/L (ref 0–44)
ANION GAP: 10 (ref 5–15)
AST: 44 U/L — AB (ref 15–41)
Alkaline Phosphatase: 71 U/L (ref 38–126)
BUN: 9 mg/dL (ref 8–23)
CHLORIDE: 112 mmol/L — AB (ref 98–111)
CO2: 19 mmol/L — ABNORMAL LOW (ref 22–32)
Calcium: 8.5 mg/dL — ABNORMAL LOW (ref 8.9–10.3)
Creatinine, Ser: 0.66 mg/dL (ref 0.61–1.24)
GFR calc Af Amer: >60 mL/min (ref >60)
Glucose, Bld: 93 mg/dL (ref 70–99)
POTASSIUM: 4 mmol/L (ref 3.5–5.1)
Sodium: 141 mmol/L (ref 135–145)
Total Bilirubin: 0.4 mg/dL (ref 0.3–1.2)
Total Protein: 7.1 g/dL (ref 6.5–8.1)

## 2018-07-18 LAB — CBC
HCT: 36.2 % — ABNORMAL LOW (ref 39.0–52.0)
HEMOGLOBIN: 11.5 g/dL — AB (ref 13.0–17.0)
MCH: 31.2 pg (ref 26.0–34.0)
MCHC: 31.8 g/dL (ref 30.0–36.0)
MCV: 98.1 fL (ref 80.0–100.0)
NRBC: 0 % (ref 0.0–0.2)
Platelets: 245 10*3/uL (ref 150–400)
RBC: 3.69 MIL/uL — AB (ref 4.22–5.81)
RDW: 17 % — ABNORMAL HIGH (ref 11.5–15.5)
WBC: 5.2 10*3/uL (ref 4.0–10.5)

## 2018-07-18 LAB — RAPID URINE DRUG SCREEN, HOSP PERFORMED
AMPHETAMINES: NOT DETECTED
BENZODIAZEPINES: NOT DETECTED
Barbiturates: NOT DETECTED
COCAINE: NOT DETECTED
OPIATES: NOT DETECTED
TETRAHYDROCANNABINOL: NOT DETECTED

## 2018-07-18 LAB — SALICYLATE LEVEL

## 2018-07-18 LAB — ETHANOL: ALCOHOL ETHYL (B): 326 mg/dL — AB (ref <10)

## 2018-07-18 LAB — ACETAMINOPHEN LEVEL: Acetaminophen (Tylenol), Serum: <10 ug/mL — ABNORMAL LOW (ref 10–30)

## 2018-07-18 MED ORDER — THIAMINE HCL 100 MG/ML IJ SOLN: 100.0000 mg | Freq: Every day | INTRAMUSCULAR | Status: DC

## 2018-07-18 MED ORDER — CHLORDIAZEPOXIDE HCL 25 MG PO CAPS
50.0000 mg | ORAL_CAPSULE | Freq: Once | ORAL | Status: AC
  Administered 2018-07-18: 50 mg via ORAL
  Filled 2018-07-18: qty 2

## 2018-07-18 MED ORDER — VITAMIN B-1 100 MG PO TABS: 100.0000 mg | ORAL_TABLET | Freq: Every day | ORAL | Status: DC

## 2018-07-18 MED ORDER — METHOCARBAMOL 500 MG PO TABS
1000.0000 mg | ORAL_TABLET | Freq: Once | ORAL | Status: AC
  Administered 2018-07-18: 1000 mg via ORAL
  Filled 2018-07-18: qty 2

## 2018-07-18 MED ORDER — LORAZEPAM 1 MG PO TABS: 0.0000 mg | ORAL_TABLET | Freq: Four times a day (QID) | ORAL | Status: DC

## 2018-07-18 MED ORDER — ADULT MULTIVITAMIN W/MINERALS CH
1.0000 | ORAL_TABLET | Freq: Every day | ORAL | Status: DC
  Administered 2018-07-18: 1 via ORAL
  Filled 2018-07-18: qty 1

## 2018-07-18 MED ORDER — LORAZEPAM 1 MG PO TABS: 0.0000 mg | ORAL_TABLET | Freq: Two times a day (BID) | ORAL | Status: DC

## 2018-07-18 MED ORDER — LORAZEPAM 2 MG/ML IJ SOLN: 0.0000 mg | Freq: Two times a day (BID) | INTRAMUSCULAR | Status: DC

## 2018-07-18 MED ORDER — VITAMIN B-1 100 MG PO TABS: 100.0000 mg | ORAL_TABLET | Freq: Three times a day (TID) | ORAL | 0 refills | Status: AC

## 2018-07-18 MED ORDER — LORAZEPAM 2 MG/ML IJ SOLN: 0.0000 mg | Freq: Four times a day (QID) | INTRAMUSCULAR | Status: DC

## 2018-07-18 MED ORDER — CHLORDIAZEPOXIDE HCL 25 MG PO CAPS: ORAL_CAPSULE | ORAL | 0 refills | Status: DC

## 2018-07-18 MED ORDER — KETOROLAC TROMETHAMINE 60 MG/2ML IM SOLN
30.0000 mg | Freq: Once | INTRAMUSCULAR | Status: AC
  Administered 2018-07-18: 30 mg via INTRAMUSCULAR
  Filled 2018-07-18: qty 2

## 2018-07-18 MED ORDER — GABAPENTIN 300 MG PO CAPS: 300.0000 mg | ORAL_CAPSULE | Freq: Three times a day (TID) | ORAL | Status: DC

## 2018-07-18 NOTE — ED Notes (Signed)
Patient very agitated and asking everyone who passes by him to speak with management. Charge RN Educational psychologist and Colma notified.

## 2018-07-18 NOTE — ED Notes (Signed)
Patient stopped this RN asking for breakfast tray. Patient noted to be eating at this time. When asked what he was eating, patient states "these are my leftovers. I have left over steak." Patient eating his leftover steak with steak sauce from inside his bag. Patient states "I still need food from you because Im a patient and that's my right." This RN updated patient that breakfast trays are not available in the ER and are ordered when a patient is admitted to the hospital. This RN offered patient a sandwich, but patient states "I want eggs and real breakfast food." Patient updated that was not available and patient states "Well, I got an ambulance ride to the hospital last night at about 10pm and nobody has helped me since. I mean, Im not even in a private room. I need a private room. I dont have a TV out here." Patient updated that private room is not available at this time and that they physician will be by to see him shortly. This RN provided patient with glass of water.

## 2018-07-18 NOTE — ED Notes (Addendum)
TTS at patient bedside. Patient denies HI/SI

## 2018-07-18 NOTE — ED Notes (Signed)
Patient is requesting to speak with management regarding "How Im being treated." Patient states "No body has helped me in 8 hours!" Charge RN Paraje notified. Stated will be by shortly.

## 2018-07-18 NOTE — ED Notes (Signed)
Pt has been given food.  Pt states he did not want it.

## 2018-07-18 NOTE — ED Notes (Signed)
Patient requesting breakfast tray again. Patient updated by this RN that breakfast trays are not available to the ED at this time. This RN checked for sandwiches, but are not available at this time. Patient offered graham crackers, cheese, peanut butter. Patient refused. Patient asked for coffee and was provided with coffee, creamer, and sugar. Patient states he wants a private room. This RN updated patient that a private room is not available at this time.

## 2018-07-18 NOTE — [facility] Suicide Risk Assessment (Signed)
Suicide Risk Assessment  Discharge Assessment   [facility] Discharge Suicide Risk Assessment   Principal Problem: Alcohol abuse with alcohol-induced mood disorder [facility]) Discharge Diagnoses: Principal Problem:   Alcohol abuse with alcohol-induced mood disorder ([place])   Total Time spent with patient: 45 minutes  Musculoskeletal: Strength & Muscle Tone: within normal limits Gait & Station: normal Patient leans: N/A  Psychiatric Specialty Exam:   Blood pressure 107/70, pulse (!) 112, temperature 98.5 F (36.9 C), temperature source Oral, resp. rate 18, height 5' 8"  (1.727 m), weight 74.8 kg, SpO2 97 %.Body mass index is 25.09 kg/m.  General Appearance: Casual  Eye Contact::  Good  Speech:  Normal Rate409  Volume:  Normal  Mood:  Irritable  Affect:  Congruent  Thought Process:  Coherent and Descriptions of Associations: Intact  Orientation:  Full (Time, Place, and Person)  Thought Content:  WDL and Logical  Suicidal Thoughts:  No  Homicidal Thoughts:  No  Memory:  Immediate;   Good Recent;   Good Remote;   Good  Judgement:  Fair  Insight:  Good  Psychomotor Activity:  Normal  Concentration:  Good  Recall:  Good  Fund of Knowledge:Fair  Language: Good  Akathisia:  No  Handed:  Right  AIMS (if indicated):     Assets:  Leisure Time Physical Health Resilience  Sleep:     Cognition: WNL  ADL's:  Intact   Mental Status Per Nursing Assessment::   On Admission:   62 yo male who presented to the ED demanding alcohol detox.  Met with the patient who was demanding and irritable.  No suicidal/homicidal ideations, hallucinations, or withdrawal symptom.  The plan was to have him meet Peer Support to help with this but left.    Demographic Factors:  Male and Caucasian  Loss Factors: NA  Historical Factors: NA  Risk Reduction Factors:   Sense of responsibility to family and Positive social support  Continued Clinical Symptoms:  Irritability  Cognitive Features That  Contribute To Risk:  None    Suicide Risk:  Minimal: No identifiable suicidal ideation.  Patients presenting with no risk factors but with morbid ruminations; may be classified as minimal risk based on the severity of the depressive symptoms  Follow-up Information    Placey, Audrea Muscat, NP.   Why:  Follow up if your back pain continues. Contact information: [place] [phone]           Plan Of Care/Follow-up recommendations:  Activity:  as tolerated  Diet:  heart healthy diet  Jahquan Klugh, NP 07/18/2018, 10:14 AM

## 2018-07-18 NOTE — ED Notes (Signed)
Bed: [facility] Expected date:  Expected time:  Means of arrival:  Comments: 

## 2018-07-18 NOTE — ED Notes (Signed)
Patient is overnight obs per TTS.
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
## 2018-07-18 NOTE — Discharge Instructions (Addendum)
Thank you for allowing me to care for you today in the Emergency Department.   Take Librium as prescribed to avoid alcohol withdrawal.  Follow up with Audrea Muscat at the IR if your back pain does not improve or with your primary care provider.  I have attached resources for rehab programs if you want treatment to stop drinking alcohol.  Return to the emergency department if you develop significant confusion, persistent vomiting, have episodes where you pee or poop on yourself, have new numbness or weakness, or other new, concerning symptoms.

## 2018-07-18 NOTE — ED Triage Notes (Addendum)
Patient brought by [facility]. Patient wants detox from alcohol. Patient states sometimes he thinks about being dead. Patient states that it would be better if he could kill himself, but he does not want to kill himself.
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
## 2018-07-18 NOTE — Progress Notes (Signed)
TTS consult ordered at 03:54 for pt who is still in the waiting room and cannot be assessed. TTS to assess when pt is actually in a private room.  Lind Covert, MSW, LCSW Therapeutic Triage Specialist  [phone]

## 2018-07-18 NOTE — ED Notes (Signed)
Bed: [facility] Expected date:  Expected time:  Means of arrival:  Comments: 

## 2018-07-18 NOTE — Progress Notes (Signed)
CSW aware of consult and familiar with patient from prior visit. CSW aware patient has a TTS consult in place and that they recommended patient be held and reassessed by AM psych team. CSW will continue to follow.  Ollen Barges, [place] Work Department  [employer]  [phone]

## 2018-07-18 NOTE — ED Notes (Signed)
Patient requesting breakfast tray again. Patient notified breakfast trays are not available at this time. There are no sandwiches available at this time.

## 2018-07-18 NOTE — Progress Notes (Signed)
TTS consulted with Lindon Romp, NP who recommends continued observation for safety and stabilization and to be reassessed in the AM by psych. Triage nurse Jenel Lucks, RN and EDP Dr. Florina Ou, MD have been advised of the disposition.

## 2018-07-18 NOTE — ED Notes (Signed)
Pt verbalizes "not given any food until this morning." Explained to pt food was given to him as requested. This Probation officer visualizes Glass blower/designer, ravioli, and ice water at beside; this Probation officer continues to discuss with pt that a medical and psychiatry provider have both evaluated him; discharge instructions reviewed; pt denies questions regarding follow up or instruction. Pt repeatedly states "I am disgusted with my care." This Probation officer and primary RN at bedside questioned pt if there was anything we could do for him; pt denies. Pt discharged.

## 2018-07-18 NOTE — ED Notes (Signed)
This RN introduced self to patient and updated patient on plan of care. Patient very upset that he is in the hallway and not "in a private room with a TV." Patient given ordered medications and provided with cup of coffee, creamer, and sugar, at patient request.

## 2018-07-20 ENCOUNTER — Encounter (HOSPITAL_COMMUNITY): Payer: Self-pay | Admitting: Emergency Medicine

## 2018-07-20 ENCOUNTER — Other Ambulatory Visit: Payer: Self-pay

## 2018-07-20 ENCOUNTER — Encounter (HOSPITAL_COMMUNITY): Payer: Self-pay | Admitting: Obstetrics and Gynecology

## 2018-07-20 ENCOUNTER — Emergency Department (HOSPITAL_COMMUNITY)
Admission: EM | Admit: 2018-07-20 | Discharge: 2018-07-21 | Disposition: A | Discharge status: Home / Self Care | Payer: Medicaid Other | Source: Home / Self Care | Attending: Emergency Medicine | Admitting: Emergency Medicine

## 2018-07-20 ENCOUNTER — Emergency Department (HOSPITAL_COMMUNITY)
Admission: EM | Admit: 2018-07-20 | Discharge: 2018-07-20 | Disposition: A | Discharge status: Home / Self Care | Payer: Medicaid Other | Attending: Emergency Medicine | Admitting: Emergency Medicine

## 2018-07-20 DIAGNOSIS — Z046 Encounter for general psychiatric examination, requested by authority: Secondary | ICD-10-CM | POA: Diagnosis present

## 2018-07-20 DIAGNOSIS — Z85828 Personal history of other malignant neoplasm of skin: Secondary | ICD-10-CM | POA: Diagnosis not present

## 2018-07-20 DIAGNOSIS — M549 Dorsalgia, unspecified: Secondary | ICD-10-CM

## 2018-07-20 DIAGNOSIS — I10 Essential (primary) hypertension: Secondary | ICD-10-CM | POA: Insufficient documentation

## 2018-07-20 DIAGNOSIS — F10920 Alcohol use, unspecified with intoxication, uncomplicated: Secondary | ICD-10-CM | POA: Diagnosis not present

## 2018-07-20 DIAGNOSIS — G8929 Other chronic pain: Secondary | ICD-10-CM

## 2018-07-20 DIAGNOSIS — Z79899 Other long term (current) drug therapy: Secondary | ICD-10-CM | POA: Diagnosis not present

## 2018-07-20 LAB — COMPREHENSIVE METABOLIC PANEL
ALT: 20 U/L (ref 0–44)
AST: 51 U/L — ABNORMAL HIGH (ref 15–41)
Albumin: 3.7 g/dL (ref 3.5–5.0)
Alkaline Phosphatase: 73 U/L (ref 38–126)
Anion gap: 13 (ref 5–15)
BILIRUBIN TOTAL: 0.1 mg/dL — AB (ref 0.3–1.2)
BUN: 10 mg/dL (ref 8–23)
CALCIUM: 8.5 mg/dL — AB (ref 8.9–10.3)
CO2: 21 mmol/L — ABNORMAL LOW (ref 22–32)
Chloride: 112 mmol/L — ABNORMAL HIGH (ref 98–111)
Creatinine, Ser: 0.7 mg/dL (ref 0.61–1.24)
GFR calc Af Amer: >60 mL/min (ref >60)
GFR calc non Af Amer: >60 mL/min (ref >60)
Glucose, Bld: 92 mg/dL (ref 70–99)
Potassium: 4.1 mmol/L (ref 3.5–5.1)
Sodium: 146 mmol/L — ABNORMAL HIGH (ref 135–145)
Total Protein: 7.5 g/dL (ref 6.5–8.1)

## 2018-07-20 LAB — CBC WITH DIFFERENTIAL/PLATELET
Abs Immature Granulocytes: 0.01 10*3/uL (ref 0.00–0.07)
BASOS ABS: 0 10*3/uL (ref 0.0–0.1)
Basophils Relative: 1 %
Eosinophils Absolute: 0.2 10*3/uL (ref 0.0–0.5)
Eosinophils Relative: 4 %
HEMATOCRIT: 37.4 % — AB (ref 39.0–52.0)
Hemoglobin: 11.7 g/dL — ABNORMAL LOW (ref 13.0–17.0)
Immature Granulocytes: 0 %
LYMPHS ABS: 2.3 10*3/uL (ref 0.7–4.0)
Lymphocytes Relative: 51 %
MCH: 31.5 pg (ref 26.0–34.0)
MCHC: 31.3 g/dL (ref 30.0–36.0)
MCV: 100.5 fL — AB (ref 80.0–100.0)
Monocytes Absolute: 0.4 10*3/uL (ref 0.1–1.0)
Monocytes Relative: 9 %
Neutro Abs: 1.6 10*3/uL — ABNORMAL LOW (ref 1.7–7.7)
Neutrophils Relative %: 35 %
Platelets: 228 10*3/uL (ref 150–400)
RBC: 3.72 MIL/uL — ABNORMAL LOW (ref 4.22–5.81)
RDW: 17.5 % — ABNORMAL HIGH (ref 11.5–15.5)
WBC: 4.5 10*3/uL (ref 4.0–10.5)
nRBC: 0 % (ref 0.0–0.2)

## 2018-07-20 LAB — ETHANOL: Alcohol, Ethyl (B): 330 mg/dL (ref <10)

## 2018-07-20 NOTE — ED Triage Notes (Signed)
Per EMS: Pt presents with chronic back pain and is coming from [facility]. Pt is intoxicated at this time. In no active distress

## 2018-07-20 NOTE — ED Notes (Addendum)
Patient is stating he needs to go now after sleeping and requesting something to drink. Patient is also stating he is missing a "big bottle of ETOH" that he had in his bag. There is no ETOH bottle that patient arrived to [facility] with via [facility].

## 2018-07-20 NOTE — ED Notes (Signed)
Bed: [facility] Expected date:  Expected time:  Means of arrival:  Comments: 

## 2018-07-20 NOTE — ED Triage Notes (Signed)
Pt presents from [facility] after being picked up from [facility] after refusing to leave. Pt did not report any complaint to EMS during transport besides being intoxicated.

## 2018-07-20 NOTE — Discharge Instructions (Signed)
Follow-up with outpatient alcohol treatment facilities as described in the resource guide you have been provided in this discharge summary.

## 2018-07-20 NOTE — ED Provider Notes (Signed)
[facility] DEPT Provider Note   CSN: [id number] Arrival date & time: 07/20/18  0108     History   Chief Complaint Chief Complaint  Patient presents with  . Alcohol Intoxication    HPI Anthony [name] is a 63 y.o. male.  Patient is a 63 year old male with history of hypertension, alcoholism, and substance-induced mood disorder.  He presents today for evaluation of drunkenness.  From what I am told, the patient was at a local [employer] and refused to leave.  EMS and police were called and the patient was transported here due to him being intoxicated.  He reported no other complaints to EMS and has no complaints to me.  He is sleeping upright in a wheelchair during exam.  The history is provided by the patient.  Alcohol Intoxication  This is a chronic problem. The problem occurs constantly. The problem has not changed since onset.   Past Medical History:  Diagnosis Date  . Alcoholism ([place])   . Hydrocele    Surgically corrected  . Hypertension   . Lumbar compression fracture ([place])   . Skin cancer    exciced 2017    Patient Active Problem List   Diagnosis Date Noted  . Alcohol abuse with alcohol-induced mood disorder ([place]) 07/18/2018  . Depression 05/26/2018  . Tooth decay 04/25/2018  . Anxiety state 04/25/2018  . Need for immunization against influenza 04/25/2018  . Lightheadedness 11/28/2017  . Pancytopenia ([place])   . Alcohol withdrawal ([place]) 09/01/2017  . Delirium tremens ([place]) 09/01/2017  . Altered mental status   . Dyspnea 11/27/2016  . Actinic keratosis 11/27/2016  . Macrocytic anemia 11/23/2016  . Dry eye 11/23/2016  . Right pulmonary embolus ([place]) 11/02/2016  . Hiatal hernia 09/14/2016  . Skin cancer 08/13/2016  . Toe pain 07/07/2013  . Poor social situation 06/09/2013  . Tinea versicolor 06/08/2013  . Trigger finger, acquired 11/18/2012  . 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 Seborrheic dermatitis of scalp  11/08/2012  . HTN (hypertension) 04/11/2012  . Alcohol abuse 09/16/2006  . Chronic low back pain 09/16/2006    Past Surgical History:  Procedure Laterality Date  . HIATAL HERNIA REPAIR  2016  . HYDROCELE EXCISION / REPAIR  2010  . SKIN CANCER EXCISION Left    Excised 2017        Home Medications    Prior to Admission medications   Medication Sig Start Date End Date Taking? Authorizing Provider  acetaminophen (TYLENOL) 650 MG CR tablet Take 1 tablet (650 mg total) by mouth every 8 (eight) hours as needed for pain. Patient taking differently: Take 1,300 mg by mouth 2 (two) times daily as needed for pain.  01/07/18   Raiford Noble Latif, DO  amLODipine (NORVASC) 5 MG tablet Take 1 tablet (5 mg total) by mouth daily. Patient not taking: Reported on 05/18/2018 01/08/18   Raiford Noble Latif, DO  cetaphil (CETAPHIL) lotion Apply topically daily. Patient not taking: Reported on 05/03/2018 09/06/17   Bufford Lope, DO  cetirizine (ZYRTEC) 10 MG tablet Take 1 tablet (10 mg total) by mouth daily. 01/07/18   Raiford Noble Latif, DO  chlordiazePOXIDE (LIBRIUM) 25 MG capsule 50mg  PO TID x 1D, then 25-50mg  PO BID X 1D, then 25-50mg  PO QD X 1D 07/18/18   McDonald, Mia A, PA-C  cyanocobalamin ([employer] VITAMIN B12) 1000 MCG tablet Take 1 tablet (1,000 mcg total) by mouth daily. 01/07/18   Raiford Noble Latif, DO  ferrous sulfate 325 (  65 FE) MG tablet Take 1 tablet (325 mg total) by mouth 2 (two) times a week. 01/10/18   Raiford Noble Latif, DO  folic acid (FOLVITE) 1 MG tablet Take 1 tablet (1 mg total) by mouth daily. 01/08/18   Raiford Noble Latif, DO  hydrochlorothiazide (HYDRODIURIL) 25 MG tablet Take 25 mg by mouth daily. 03/18/18   [provider]  hydroxypropyl methylcellulose / hypromellose (ISOPTO TEARS / GONIOVISC) 2.5 % ophthalmic solution Place 1 drop into both eyes as needed for dry eyes. Patient not taking: Reported on 05/03/2018 01/07/18   Raiford Noble Latif, DO  ketoconazole (NIZORAL) 2  % cream Apply topically 2 (two) times daily as needed for irritation. Patient not taking: Reported on 05/03/2018 04/22/18   Kathrene Alu, MD  methocarbamol (ROBAXIN) 750 MG tablet TAKE 1 TABLET (750 MG TOTAL) BY MOUTH EVERY 8 (EIGHT) HOURS AS NEEDED. 04/25/18   Kathrene Alu, MD  Multiple Vitamin (MULTIVITAMIN WITH MINERALS) TABS tablet Take 1 tablet by mouth 2 (two) times a week. 01/10/18   Raiford Noble Latif, DO  sertraline (ZOLOFT) 100 MG tablet Take 1.5 tablets (150 mg total) by mouth daily. For 4 weeks then 200 mg per day thereafter 05/26/18   Zenia Resides, MD  thiamine (VITAMIN B-1) 100 MG tablet Take 1 tablet (100 mg total) by mouth 3 (three) times daily for 14 days. 07/18/18 08/01/18  McDonald, Mia A, PA-C  vitamin B-6 (PYRIDOXINE) 25 MG tablet Take 1 tablet (25 mg total) by mouth daily. 04/27/18   Kathrene Alu, MD    Family History Family History  Problem Relation Age of Onset  . Heart attack Father        died at 60 years  . Dementia Father   . Stroke Father   . Cancer Sister     Social History Social History   Tobacco Use  . Smoking status: Never Smoker  . Smokeless tobacco: Never Used  Substance Use Topics  . Alcohol use: Yes    Alcohol/week: 84.0 standard drinks    Types: 84 Cans of beer per week    Comment: daily use- 2 40 ounce beers daily and whiskey when he has it.  . Drug use: Yes    Types: Marijuana    Comment: occasional     Allergies   Patient has no known allergies.   Review of Systems Review of Systems  All other systems reviewed and are negative.    Physical Exam Updated Vital Signs BP (!) 134/93 (BP Location: Left Arm)   Pulse 87   Temp (!) 97.3 F (36.3 C) (Oral)   Resp 16   Ht 5\' 8"  (1.727 m)   Wt 74 kg   SpO2 98%   BMI 24.81 kg/m   Physical Exam Vitals signs and nursing note reviewed.  Constitutional:      General: He is not in acute distress.    Appearance: He is well-developed. He is not diaphoretic.      Comments: Patient is a 63 year old male in no acute distress.  He is resting comfortably.  Odor of alcohol is present.  Patient does seem somewhat disheveled and unkempt.  HENT:     Head: Normocephalic and atraumatic.  Neck:     Musculoskeletal: Normal range of motion and neck supple.  Cardiovascular:     Rate and Rhythm: Normal rate and regular rhythm.     Heart sounds: No murmur. No friction rub.  Pulmonary:     Effort: Pulmonary effort  is normal. No respiratory distress.     Breath sounds: Normal breath sounds. No wheezing or rales.  Abdominal:     General: Bowel sounds are normal. There is no distension.     Palpations: Abdomen is soft.     Tenderness: There is no abdominal tenderness.  Musculoskeletal: Normal range of motion.  Skin:    General: Skin is warm and dry.  Neurological:     Mental Status: He is alert and oriented to person, place, and time.     Coordination: Coordination normal.      ED Treatments / Results  Labs (all labs ordered are listed, but only abnormal results are displayed) Labs Reviewed  CBC WITH DIFFERENTIAL/PLATELET  ETHANOL  COMPREHENSIVE METABOLIC PANEL    EKG None  Radiology No results found.  Procedures Procedures (including critical care time)  Medications Ordered in ED Medications - No data to display   Initial Impression / Assessment and Plan / ED Course  I have reviewed the triage vital signs and the nursing notes.  Pertinent labs & imaging results that were available during my care of the patient were reviewed by me and considered in my medical decision making (see chart for details).  Patient brought by EMS for evaluation of alcohol intoxication.  He apparently refused to leave a local [employer] and authorities were called.  Patient has rested comfortably throughout his emergency department stay.  His laboratory studies are unremarkable with the exception of a blood alcohol of 330.  He continues to be somnolent, but arousable.   Once patient sobers up appropriately, will be ready for discharge.  Final Clinical Impressions(s) / ED Diagnoses   Final diagnoses:  None    ED Discharge Orders    None       Veryl Speak, MD 07/20/18 [phone]

## 2018-07-20 NOTE — ED Provider Notes (Signed)
Patient is alert, ambulatory, neurologically intact.  He wants to be discharged.   Nat Christen, MD 07/20/18 1105

## 2018-07-20 NOTE — ED Notes (Signed)
Date and time results received: 07/20/18 6:22 AM (use smartphrase ".now" to insert current time)  Test: ETOH Critical Value: 330  Name of Provider Notified: Delo  Orders Received? Or Actions Taken?: Orders Received - See Orders for details

## 2018-07-20 NOTE — ED Notes (Signed)
Bed: [facility] Expected date:  Expected time:  Means of arrival:  Comments: 

## 2018-07-21 NOTE — ED Provider Notes (Signed)
TIME SEEN: 1:07 AM  CHIEF COMPLAINT: Alcohol abuse, chronic back pain  HPI: Patient is a 63 year old male with history of alcoholism, chronic back pain who presents to the emergency department complaining of chronic back pain.  States this is been ongoing for 6 years.  Denies any numbness, weakness, bowel or bladder incontinence, fever.  Able to ambulate.  Has been drinking alcohol today.  ROS: See HPI Constitutional: no fever  Eyes: no drainage  ENT: no runny nose   Cardiovascular:  no chest pain  Resp: no SOB  GI: no vomiting GU: no dysuria Integumentary: no rash  Allergy: no hives  Musculoskeletal: no leg swelling  Neurological: no slurred speech ROS otherwise negative  PAST MEDICAL HISTORY/PAST SURGICAL HISTORY:  Past Medical History:  Diagnosis Date  . Alcoholism ([place])   . Hydrocele    Surgically corrected  . Hypertension   . Lumbar compression fracture ([place])   . Skin cancer    exciced 2017    MEDICATIONS:  Prior to Admission medications   Medication Sig Start Date End Date Taking? Authorizing Provider  acetaminophen (TYLENOL) 650 MG CR tablet Take 1 tablet (650 mg total) by mouth every 8 (eight) hours as needed for pain. Patient taking differently: Take 1,300 mg by mouth 2 (two) times daily as needed for pain.  01/07/18   Raiford Noble Latif, DO  amLODipine (NORVASC) 5 MG tablet Take 1 tablet (5 mg total) by mouth daily. Patient not taking: Reported on 05/18/2018 01/08/18   Raiford Noble Latif, DO  cetaphil (CETAPHIL) lotion Apply topically daily. Patient not taking: Reported on 05/03/2018 09/06/17   Bufford Lope, DO  cetirizine (ZYRTEC) 10 MG tablet Take 1 tablet (10 mg total) by mouth daily. 01/07/18   Raiford Noble Latif, DO  chlordiazePOXIDE (LIBRIUM) 25 MG capsule 50mg  PO TID x 1D, then 25-50mg  PO BID X 1D, then 25-50mg  PO QD X 1D 07/18/18   McDonald, Mia A, PA-C  cyanocobalamin ([employer]S VITAMIN B12) 1000 MCG tablet Take 1 tablet (1,000 mcg total) by mouth daily. 01/07/18    Raiford Noble Latif, DO  ferrous sulfate 325 (65 FE) MG tablet Take 1 tablet (325 mg total) by mouth 2 (two) times a week. 01/10/18   Raiford Noble Latif, DO  folic acid (FOLVITE) 1 MG tablet Take 1 tablet (1 mg total) by mouth daily. 01/08/18   Raiford Noble Latif, DO  hydrochlorothiazide (HYDRODIURIL) 25 MG tablet Take 25 mg by mouth daily. 03/18/18   [provider]  hydroxypropyl methylcellulose / hypromellose (ISOPTO TEARS / GONIOVISC) 2.5 % ophthalmic solution Place 1 drop into both eyes as needed for dry eyes. Patient not taking: Reported on 05/03/2018 01/07/18   Raiford Noble Latif, DO  ketoconazole (NIZORAL) 2 % cream Apply topically 2 (two) times daily as needed for irritation. Patient not taking: Reported on 05/03/2018 04/22/18   Kathrene Alu, MD  methocarbamol (ROBAXIN) 750 MG tablet TAKE 1 TABLET (750 MG TOTAL) BY MOUTH EVERY 8 (EIGHT) HOURS AS NEEDED. 04/25/18   Kathrene Alu, MD  Multiple Vitamin (MULTIVITAMIN WITH MINERALS) TABS tablet Take 1 tablet by mouth 2 (two) times a week. 01/10/18   Raiford Noble Latif, DO  sertraline (ZOLOFT) 100 MG tablet Take 1.5 tablets (150 mg total) by mouth daily. For 4 weeks then 200 mg per day thereafter 05/26/18   Zenia Resides, MD  thiamine (VITAMIN B-1) 100 MG tablet Take 1 tablet (100 mg total) by mouth 3 (three) times daily for 14 days. 07/18/18 08/01/18  McDonald, Mia A, PA-C  vitamin B-6 (PYRIDOXINE) 25 MG tablet Take 1 tablet (25 mg total) by mouth daily. 04/27/18   Kathrene Alu, MD    ALLERGIES:  No Known Allergies  SOCIAL HISTORY:  Social History   Tobacco Use  . Smoking status: Never Smoker  . Smokeless tobacco: Never Used  Substance Use Topics  . Alcohol use: Yes    Alcohol/week: 84.0 standard drinks    Types: 84 Cans of beer per week    Comment: daily use- 2 40 ounce beers daily and whiskey when he has it.    FAMILY HISTORY: Family History  Problem Relation Age of Onset  . Heart attack Father         died at 35 years  . Dementia Father   . Stroke Father   . Cancer Sister     EXAM: BP 125/82 (BP Location: Right Arm)   Pulse (!) 118   Resp 17   SpO2 100%  CONSTITUTIONAL: Alert and oriented and responds appropriately to questions. Well-appearing; well-nourished HEAD: Normocephalic EYES: Conjunctivae clear, pupils appear equal, EOMI ENT: normal nose; moist mucous membranes NECK: Supple, no meningismus, no nuchal rigidity, no LAD  CARD: Regular and tachycardic; S1 and S2 appreciated; no murmurs, no clicks, no rubs, no gallops RESP: Normal chest excursion without splinting or tachypnea; breath sounds clear and equal bilaterally; no wheezes, no rhonchi, no rales, no hypoxia or respiratory distress, speaking full sentences ABD/GI: Normal bowel sounds; non-distended; soft, non-tender, no rebound, no guarding, no peritoneal signs, no hepatosplenomegaly BACK:  The back appears normal and is non-tender to palpation, there is no CVA tenderness, no tenderness over the midline spine and no step-off or deformity EXT: Normal ROM in all joints; non-tender to palpation; no edema; normal capillary refill; no cyanosis, no calf tenderness or swelling    SKIN: Normal color for age and race; warm; no rash NEURO: Moves all extremities equally, reports normal sensation diffusely, no saddle anesthesia, ambulates without any assistance PSYCH: The patient's mood and manner are appropriate. Grooming and personal hygiene are appropriate.  MEDICAL DECISION MAKING: Patient here with history of alcohol abuse and complaint of chronic back pain.  No focal neurologic deficits or fever.  He is tachycardic here which may be from his alcohol use.  He has no fever, abdominal pain, vomiting, diarrhea.  Plan is to observe patient and recheck vital signs.  He does not need any emergent imaging of his back as he has no red flag symptoms currently.  Doubt cauda equina, epidural abscess or hematoma, discitis or osteomyelitis,  transverse myelitis.  Reports this is the same pain he has had for 6 years.  I do not feel narcotics are indicated given his history of alcohol abuse.  ED PROGRESS: Patient still tachycardic.  Refusing to allow [place] to check a temperature.  He was found to have a bottle of wine on him and was drinking in the emergency department.  Security discussed with patient that we would have to dispose of this bottle of wine given it is open and patient became upset and requesting to leave.  I am concerned that he is still tachycardic here and would like to observe him further but this time I do not have any reason to involuntarily commit him or hold him against his will.  He has been drinking but does not appear so intoxicated that he does not understand the risk of leaving without further treatment.  He is ambulatory and has clear speech  currently.  Discussed return precautions and provided with discharge paperwork with outpatient follow-up.   I reviewed all nursing notes, vitals, pertinent previous records, EKGs, lab and urine results, imaging (as available).      Oksana Deberry, Delice Bison, DO 07/21/18 0139

## 2018-07-21 NOTE — Discharge Instructions (Addendum)
Steps to find a Primary Care Provider (PCP): ° °Call [phone] or [phone] to access "[facility] Find a Doctor Service." ° °2.  You may also go on the [facility] website at [email or web address] ° °3.  [facility] also frequently accepts new patients. ° °[facility]  °[address] °[place] [address] °[phone] ° °4.  There are also multiple [facility], [facility] and [facility] practices throughout the [place] that are frequently accepting new patients. You may find a clinic that is close to your home and contact them. ° °[facility] °[email or web address] °[phone] ° °[facility] °[email or web address] ° °[facility] °[email or web address] °[phone] ° °[facility] °[email or web address] °[phone] ° °5.  Local Health Departments also can provide primary care services. ° °[facility]  °[address] °[place] [address] °[phone] ° °[facility] °[address] °[place] [address] °[phone] ° °[facility] °[address]  °[place] [address] °[phone] ° ° °

## 2018-07-21 NOTE — ED Notes (Signed)
Patient was found drinking alcohol in the hall. Told patient that he was not allowed to indulge in alcoholic beverages while in the hospital. Security notified patient that alcohol would have to be disposed of because the seal was broken (opened) and patient stated that he needs the alcohol. Informed patient that he would be signing out against medical advice if he decided to leave before his treatment was complete. Patient has agreed to sign out AMA at this time.

## 2018-08-18 ENCOUNTER — Other Ambulatory Visit: Payer: Self-pay

## 2018-08-18 DIAGNOSIS — D539 Nutritional anemia, unspecified: Secondary | ICD-10-CM

## 2018-08-19 MED ORDER — CYANOCOBALAMIN 1000 MCG PO TABS: 1000.0000 ug | ORAL_TABLET | Freq: Every day | ORAL | 0 refills | Status: DC

## 2018-09-23 ENCOUNTER — Other Ambulatory Visit: Payer: Self-pay | Admitting: Family Medicine

## 2018-09-23 DIAGNOSIS — M5441 Lumbago with sciatica, right side: Principal | ICD-10-CM

## 2018-09-23 DIAGNOSIS — F411 Generalized anxiety disorder: Secondary | ICD-10-CM

## 2018-09-23 DIAGNOSIS — G8929 Other chronic pain: Secondary | ICD-10-CM

## 2018-09-23 MED ORDER — FOLIC ACID 1 MG PO TABS: 1.0000 mg | ORAL_TABLET | Freq: Every day | ORAL | 0 refills | Status: DC

## 2018-09-23 MED ORDER — HYDROCHLOROTHIAZIDE 25 MG PO TABS: 25.0000 mg | ORAL_TABLET | Freq: Every day | ORAL | 3 refills | Status: DC

## 2018-09-23 MED ORDER — AMLODIPINE BESYLATE 5 MG PO TABS: 5.0000 mg | ORAL_TABLET | Freq: Every day | ORAL | 0 refills | Status: DC

## 2018-09-23 MED ORDER — FERROUS SULFATE 325 (65 FE) MG PO TABS: 325.0000 mg | ORAL_TABLET | ORAL | 0 refills | Status: DC

## 2018-09-23 MED ORDER — SERTRALINE HCL 100 MG PO TABS: 150.0000 mg | ORAL_TABLET | Freq: Every day | ORAL | 3 refills | Status: DC

## 2018-10-18 ENCOUNTER — Ambulatory Visit: Payer: Self-pay | Admitting: Family Medicine

## 2018-11-07 ENCOUNTER — Telehealth: Payer: Self-pay | Admitting: Family Medicine

## 2018-11-07 ENCOUNTER — Telehealth: Payer: Self-pay

## 2018-11-07 NOTE — Telephone Encounter (Signed)
Patient called the nursing line, requested COVID testing.  Discussed with patient over the phone to triage for this.  States he has been feeling "bad "for the past month, however is feeling better now.  Endorses still having some congestion and mucus.  Some associated NB diarrhea, however states it is "not often "and not 1 of his major problems.  No associated fever, does not have a thermometer but has not felt warm.  Not coughing.  Breathing at baseline while at rest, however after walking long periods of time feels slightly out of breath and has to stop.  In efforts to reduce exposures, has not been using the bus which is his main transportation, therefore has been doing a 2-hour round trip walk to the grocery store and pharmacy.   He has been isolated for about 2 months, very [place] contact.  [place]ives in [place], lives [place].  He is very frustrated with social isolation.  Has a history of depression, has been taking his medications.  Denies any active SI/HI, or thoughts of harming himself or others.  History of alcoholism, already had at least 2 beers for the day.  Not considering cutting back at any point.  Has family he can reach out to for social support.  Has not established therapy in the past, would consider talking with someone, however would like it to be today.  "I do not keep my phone on me, if it rings in a few days I may not answer, a couple of days from now is not answer I will not accept ".   Objectively, patient is able to speak in long sentences comfortably.  No coughing or sneezing throughout our conversation, does not sound congested. No slurring of words and can hold conversation. Informed patient he did not meet testing criteria at this time for COVID, assured that he feels he is getting better. Offered patient an ATC appointment to further discuss his complaints, however refused to come as it "is not an emergency and there are other people that should be seen over  me".  Additionally, advised if he clinically worsens or has increasing shortness of breath, should present to the ED via EMS or bus.  Advised continuing self-isolation, call clinic if needed, and to continue supportive measures for congestion (steam showers etc).  Additionally, feel that patient needs continued interactions due to the difficulties of isolation at home.  Will touch base with Neoma Laming, our social worker, in the clinic to schedule a call this week or reach out to therapy offices that are doing telemedicine at this time, and will route this to his PCP for further follow up.  While patient is frustrated that this will not be today, will still set this up.  Also recommended he reach out to his family for support on a daily basis. Discussed plan of management with Dr. Lunette Stands.     [name], DO

## 2018-11-07 NOTE — Telephone Encounter (Signed)
Pt LVM on nurse line stating he doesn't appreciate anyone not returning his call from Friday. I did call him on Friday and again today and left a second message. He is not answering.

## 2018-11-09 ENCOUNTER — Telehealth: Payer: Self-pay | Admitting: Licensed Clinical Social Worker

## 2018-11-09 NOTE — Telephone Encounter (Signed)
   Outreach Note  11/09/2018 Name: Anthony [name] MRN: [id number] DOB: 10-Feb-1956  Referred by: Dr. Higinio Plan Reason for referral : [facility] (for therapy)  An unsuccessful telephone outreach to patient was attempted today ref. The above consult.  Voice mail confirmed patient's first and last name.  Left phone number for therapy resource, also asked patient to call LCSW for information on additional resources .   Follow Up Plan: LCSW will call again in 1 to 2 days. If no return call is received.  Dr. Higinio Plan has been notified of this out reach and F/U plan via in-basket message.  Casimer Lanius, LCSW [facility]   [phone] 11:11 AM

## 2018-11-10 NOTE — Telephone Encounter (Signed)
2nd unsuccessful telephone outreach attempt to Mr. Anthony [name] today. Left 2nd voice message.  Plan: LCSW will wait for return call.  Casimer Lanius, [facility]   [phone] 4:12 PM

## 2018-11-16 ENCOUNTER — Telehealth (INDEPENDENT_AMBULATORY_CARE_PROVIDER_SITE_OTHER): Payer: Medicaid Other | Admitting: Family Medicine

## 2018-11-16 ENCOUNTER — Other Ambulatory Visit: Payer: Self-pay

## 2018-11-16 DIAGNOSIS — K047 Periapical abscess without sinus: Secondary | ICD-10-CM

## 2018-11-16 MED ORDER — PENICILLIN V POTASSIUM 250 MG PO TABS: 250.0000 mg | ORAL_TABLET | Freq: Four times a day (QID) | ORAL | 0 refills | Status: DC

## 2018-11-16 NOTE — Assessment & Plan Note (Signed)
By his description.  Has not seen dentist since was homeless. Now has stable living situation.  Seems to be slight flare of chronic condition.  Will treat with pcn.  He will call with name of his pharmacy.  Also needs names of dentists who accept Medicaid

## 2018-11-16 NOTE — Progress Notes (Signed)
[facility] Visit  Patient consented to have virtual visit. Method of visit: Telephone  Encounter participants: Patient: Anthony [name] - located at home  Provider: Lind Covert - located at office Others (if applicable): no  Chief Complaint: Teeth infection  HPI:  Has bad teeth for years.  Feels several on both sides are infected.  Painful loose and red but not acutely.  No fever or problems swallowing  ROS: per HPI  Pertinent PMHx: alchohol abuse  Exam:  Respiratory: speaking normal rate but sounds slighlty slurred and requires redirecting to acute problems   Assessment/Plan:  Tooth infection By his description.  Has not seen dentist since was homeless. Now has stable living situation.  Seems to be slight flare of chronic condition.  Will treat with pcn.  He will call with name of his pharmacy.  Also needs names of dentists who accept Medicaid     Time spent during visit with patient: 15 minutes

## 2018-12-16 ENCOUNTER — Telehealth: Payer: Self-pay

## 2018-12-16 NOTE — Telephone Encounter (Signed)
Patient Anthony [name] on nurse line stating he is having cataract surgery on 6/5. Pt stated he believes he may need an aide to help him after for a couple of days.

## 2018-12-20 ENCOUNTER — Other Ambulatory Visit: Payer: Self-pay | Admitting: *Deleted

## 2018-12-20 DIAGNOSIS — G8929 Other chronic pain: Secondary | ICD-10-CM

## 2018-12-20 MED ORDER — METHOCARBAMOL 750 MG PO TABS: 750.0000 mg | ORAL_TABLET | Freq: Three times a day (TID) | ORAL | 3 refills | Status: DC | PRN

## 2018-12-20 NOTE — Telephone Encounter (Signed)
Pt is requesting a refill of methocarbamol.  Since Dr. Criss Rosales filled his last script medicaid is not paying for medication (glitch in system we are actively working on now).  Will forward to PCP to ask her to refill if appropriate.  Christen Bame, CMA

## 2019-01-05 ENCOUNTER — Telehealth (INDEPENDENT_AMBULATORY_CARE_PROVIDER_SITE_OTHER): Payer: Medicaid Other | Admitting: Family Medicine

## 2019-01-05 ENCOUNTER — Other Ambulatory Visit: Payer: Self-pay

## 2019-01-05 DIAGNOSIS — Z20828 Contact with and (suspected) exposure to other viral communicable diseases: Secondary | ICD-10-CM | POA: Diagnosis not present

## 2019-01-05 DIAGNOSIS — Z20822 Contact with and (suspected) exposure to covid-19: Secondary | ICD-10-CM

## 2019-01-05 DIAGNOSIS — R0602 Shortness of breath: Secondary | ICD-10-CM

## 2019-01-06 NOTE — Progress Notes (Signed)
[facility] Telemedicine Visit  Patient consented to have virtual visit. Method of visit: Telephone  Encounter participants: Patient: Anthony [name] - located at Home Provider: Nuala Alpha - located at [place] Others (if applicable): None  Chief Complaint: Concern about COVID exposure  HPI: Patient states he was informed his office had a COVID positive patient and he is somewhat concerned as he has some SOB and night sweats. He has an appointment with [place] on Monday in clinic. He is not struggling to breath and states he is not working hard to breath. The SOB is "just sometimes" and "goes away quick". His night sweats are chronic and he also endorses chronic diarrhea and dizziness. He has not had a fever and has no cough.   ROS: per HPI  Pertinent PMHx: HTN, EtOH abuse, Anemia, AMS,   Exam:  Respiratory: NWOB, speaking in full sentences with no difficulty  Assessment/Plan:  Concern for COVID exposure Patient was never directly exposed to COVID positive person that he is aware of. He is not having fever, cough, or difficulty breathing. He has some non-specific symptoms but these are also chronic symptoms that he has had in the past. Most likely he is COVID negative. Advised to continue to self quarantine and practice good hygiene to limit possible exposure. Patient will come into clinic on Monday as he said he already had an appointment scheduled for that time.   Time spent during visit with patient: >15 minutes

## 2019-01-09 ENCOUNTER — Telehealth: Payer: Self-pay | Admitting: *Deleted

## 2019-01-09 NOTE — Telephone Encounter (Signed)
Pt LM on nurse line stating that he is upset because " I need covid testing before my appt".  Unclear of what he is taking about, per review of last note, Dr. Allen Kell does not feel he is a candidate.    LMOVM for pt to return call.  Christen Bame, CMA

## 2019-01-31 ENCOUNTER — Telehealth: Payer: Self-pay | Admitting: *Deleted

## 2019-01-31 NOTE — Telephone Encounter (Signed)
Sounds like he need to be given some feedback. It will be nice if his PCP contacts him and discuss our clinic policy regarding communication with staff.   Dr. Shan Levans, please follow-up on this and document your conversation on file. If he does not like our rules and won't accept it, it sounds like he might benefit from a different clinic with a different policy from ours. Please, give the patient that feedback. Thanks you.

## 2019-01-31 NOTE — Telephone Encounter (Signed)
Pt lm on RN line VM x 2 (with in 30 minutes of each other) and demands we call him back.  States "I dont like the 24 - 48 hours rule and I dont accept that. I want you to call me back immediately."  Returned call.  No answer.  LMOVM for pt to call back.  Christen Bame, CMA

## 2019-02-19 IMAGING — MR MR LUMBAR SPINE W/O CM
5 series · 40 of 48 positions shown · non-contrast
Comparison: Outside lumbar MRI 02/25/2016, and thoracolumbar
radiographs 01/17/2016 available on [HOSPITAL] PACS

CLINICAL DATA: 61-year-old male with lumbar radiculopathy,
arthritis.

EXAM:
MRI LUMBAR SPINE WITHOUT CONTRAST
TECHNIQUE: Multiplanar, multisequence MR imaging of the lumbar spine was
performed. No intravenous contrast was administered.

[Series 3: tirm sag · sagittal · 4.0mm · 0.55mm/px · 6 of 13 slices shown]
[im 1/13]
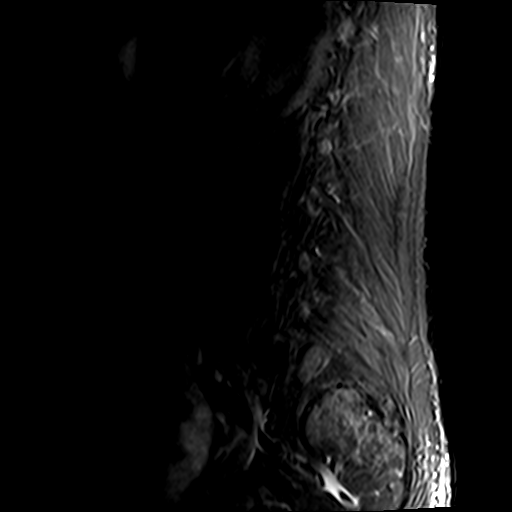
[im 3/13]
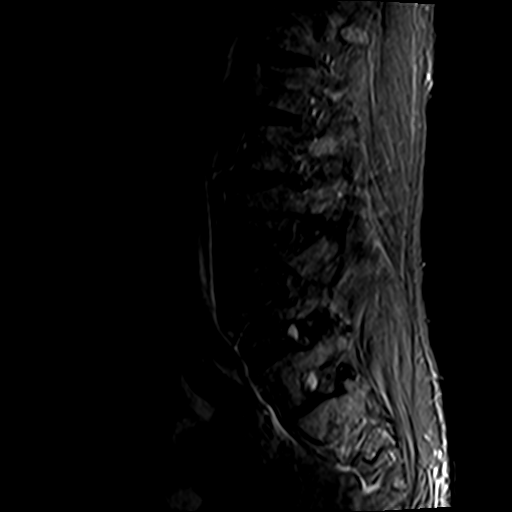
[im 5/13]
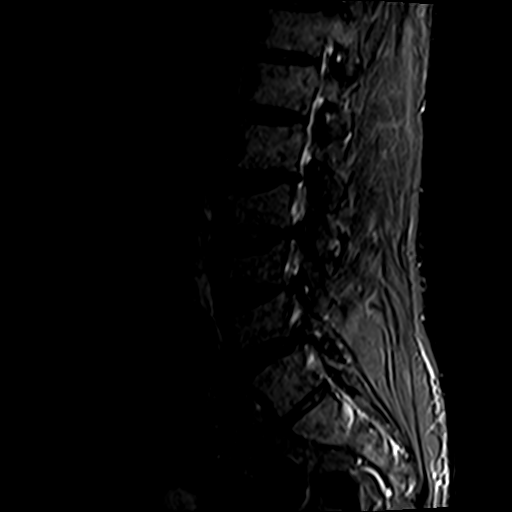
[im 8/13]
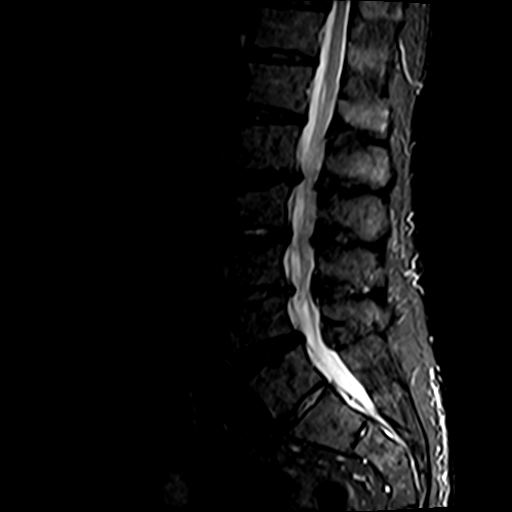
[im 10/13]
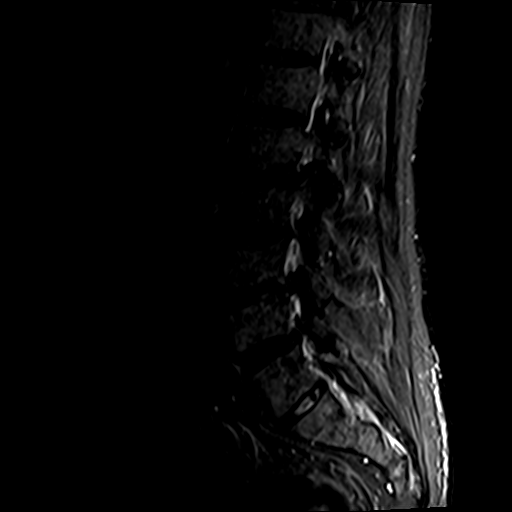
[im 13/13]
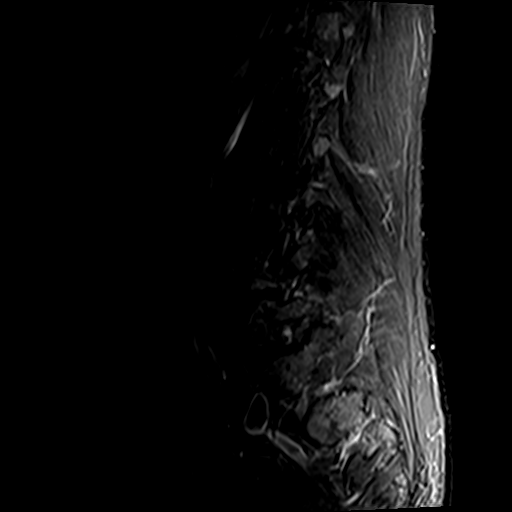

[Series 4: T2 · sagittal · 4.0mm · 0.88mm/px · 5 of 13 slices shown (1 of 2)]
[im 1/13]
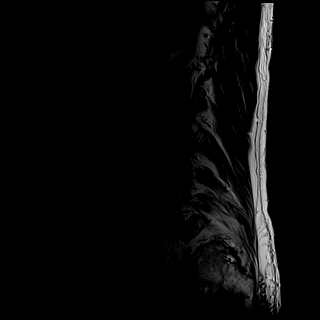
[im 4/13]
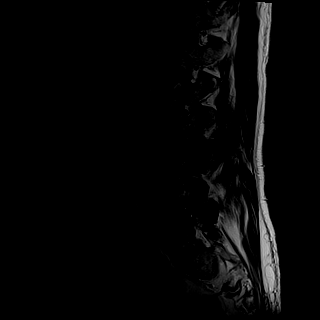
[im 7/13]
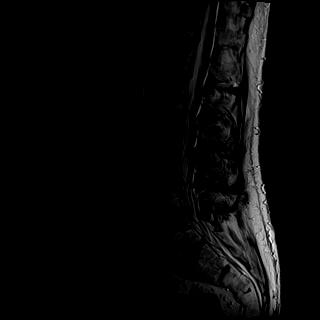
[im 10/13]
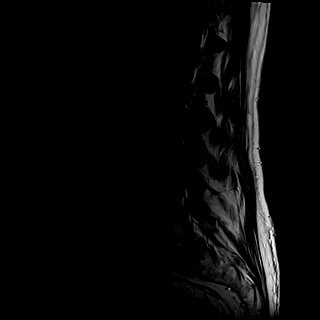
[im 13/13]
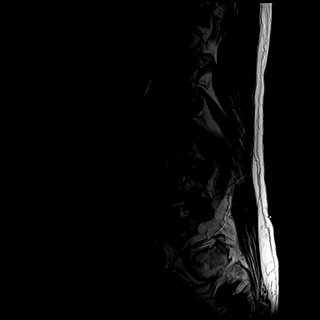

[Series 5: T1 · sagittal · 4.0mm · 0.88mm/px · 5 of 13 slices shown (1 of 2)]
[im 1/13]
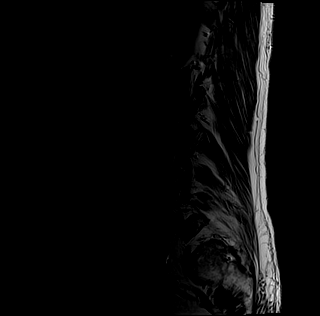
[im 4/13]
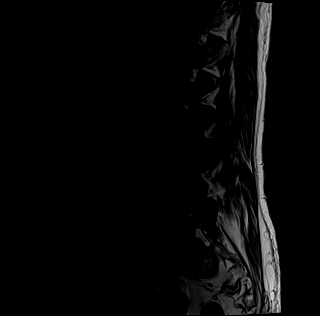
[im 7/13]
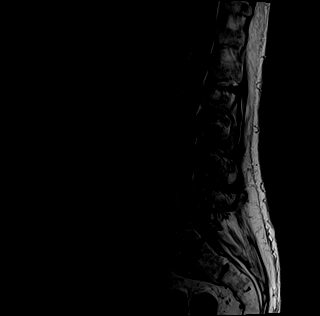
[im 10/13]
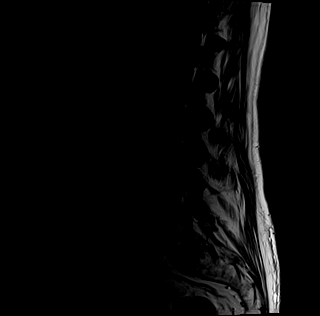
[im 13/13]
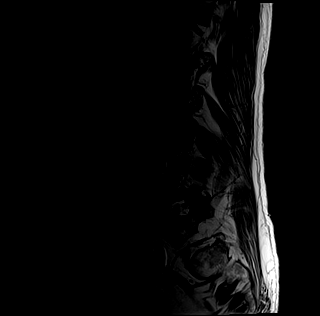

[Series 6: T1 · axial · 4.0mm · 0.78mm/px · z∈[-117,+97]mm · 10 of 42 slices shown (2 of 2)]
[im 3/42]
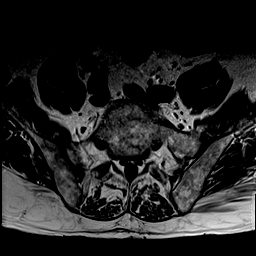
[im 6/42]
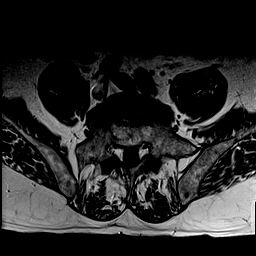
[im 9/42]
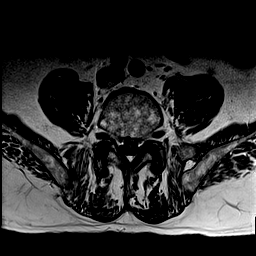
[im 14/42]
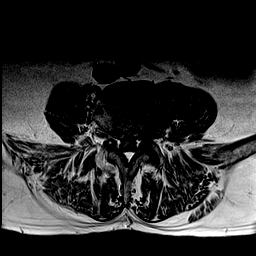
[im 20/42]
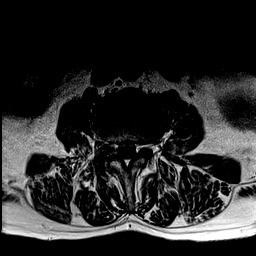
[im 22/42]
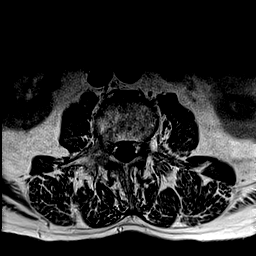
[im 25/42]
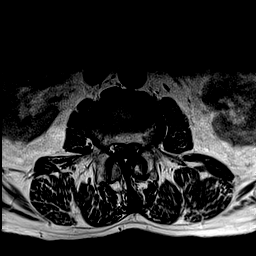
[im 31/42]
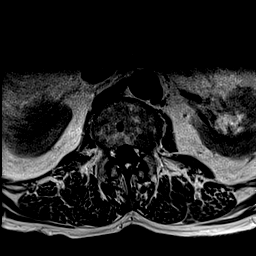
[im 36/42]
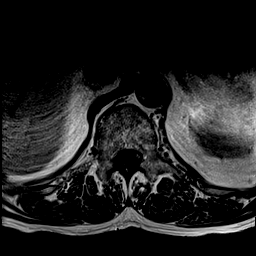
[im 42/42]
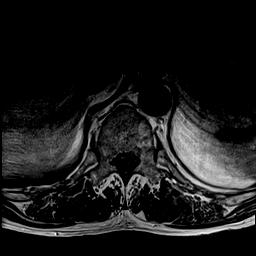

[Series 7: T2 · axial · 4.0mm · 0.78mm/px · z∈[-127,+97]mm · 14 of 42 slices shown (2 of 2)]
[im 1/42]
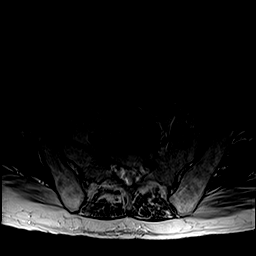
[im 3/42]
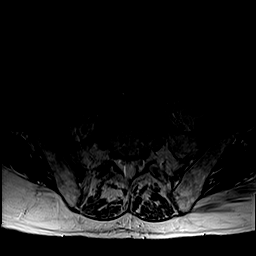
[im 6/42]
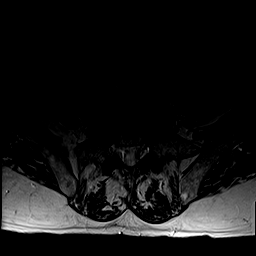
[im 9/42]
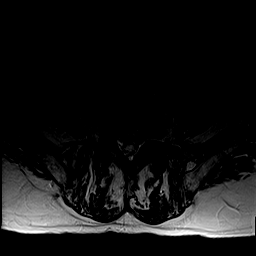
[im 11/42]
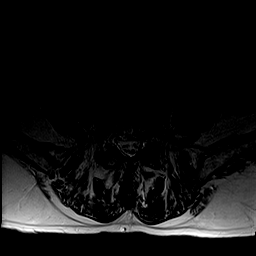
[im 14/42]
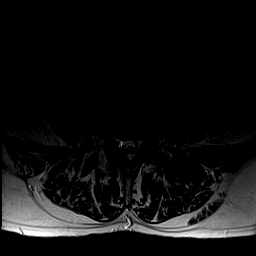
[im 17/42]
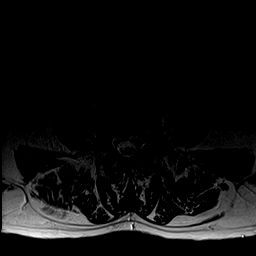
[im 20/42]
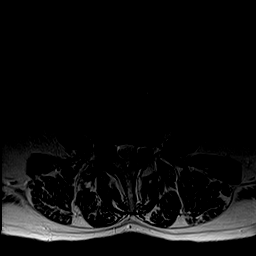
[im 22/42]
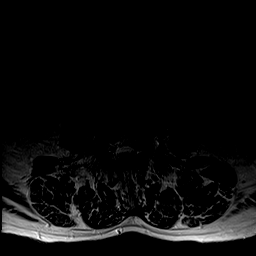
[im 25/42]
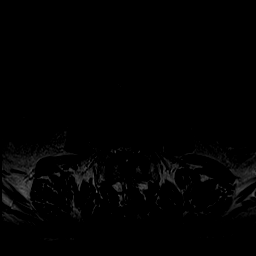
[im 28/42]
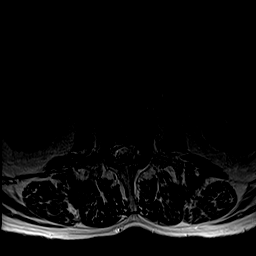
[im 31/42]
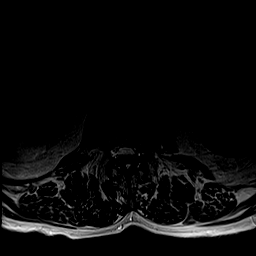
[im 36/42]
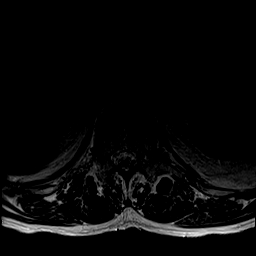
[im 42/42]
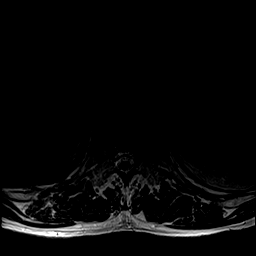

[40 of 48 positions shown; findings below may reference images not displayed]

FINDINGS: Segmentation: Mildly transitional lumbosacral anatomy demonstrated
on the comparison radiographs with a partially lumbarized S1 level
and vestigial S1-S2 disc space. Full size ribs at T12. Correlation
with radiographs is recommended prior to any operative intervention.

Alignment: Stable vertebral height and alignment. Chronic mild
superior endplate compression deformities of L3 through L5. Subtle
anterolisthesis of L5 on S1. Subtle retrolisthesis of L1 on L2.

Vertebrae: No marrow edema or evidence of acute osseous abnormality.
Visualized bone marrow signal is within normal limits. Intact
visible sacrum and SI joints.

Conus medullaris: Extends to the L1 level and appears normal.

Paraspinal and other soft tissues: Negative.

Disc levels:

T11-T12: Partially visible disc bulge. Borderline to mild spinal
stenosis.

T12-L1:  Mild disc bulge and epidural lipomatosis.

L1-L2: Mild retrolisthesis and mostly far lateral disc bulging. No
significant stenosis.

L2-L3: Circumferential disc bulge with broad-based posterior
component. Mild facet and ligament flavum hypertrophy. Mild spinal
and left lateral recess stenosis (series 7, image 17). Endplate
spurring. Mild multifactorial left L2 foraminal stenosis. This level
is stable.

L3-L4: Circumferential disc bulge, eccentric to the right. Moderate
to severe facet and ligament flavum hypertrophy, worse on the right.
Mild spinal stenosis with mild to moderate right lateral recess and
moderate to severe right L3 foraminal foraminal stenosis. This level
is stable.

L4-L5: disc space loss, especially posteriorly, with circumferential
disc bulge. Broad-based posterior component. Mild to moderate facet
and ligament flavum hypertrophy greater on the left. Epidural
lipomatosis. Mild to moderate spinal stenosis with mild bilateral
lateral recess stenosis (series 7, image 30). Mild to moderate
bilateral L4 foraminal stenosis, greater on the left. This level is
stable.

L5-S1: Circumferential disc bulge with broad-based posterior
component. Moderate to severe facet hypertrophy. Borderline to mild
spinal and bilateral lateral recess stenosis. Mild left L5 foraminal
stenosis. Moderate to severe right L5 foraminal stenosis appears to
be multifactorial, and might in part be related to a 5-6 mm synovial
cyst projecting into that foramen (series 4, image 4), but this is
not well correlated on the axial images. This level is stable.

S1-S2:  Partially lumbarized.  Otherwise negative.
IMPRESSION: 1. Mildly transitional anatomy with a partially lumbarized S1 level.
Correlation with radiographs is recommended prior to any operative
intervention.
2. Stable MRI appearance of the lumbar spine since February 2016.
- Multifactorial mild to moderate spinal stenosis L2-L3 through
L4-L5, with up to moderate lateral recess stenosis on the right at
L3-L4, and moderate to severe foraminal stenosis at the right L3 and
L4 nerve levels.
- Moderate to severe right foraminal stenosis at L5-S1 might be in
part related to a 5-6 mm synovial cyst projecting into that foramen.
Query right L5 radiculitis.

## 2019-02-21 ENCOUNTER — Telehealth: Payer: Self-pay | Admitting: Family Medicine

## 2019-02-21 NOTE — Telephone Encounter (Signed)
Left a voicemail on patient's phone reminding him of our policy of calling back within 48 hours and asking him to please speak professionally and respectfully to our staff.  I encouraged him to call and make an appointment since it has been over a year since I have seen him.  I told him I would really like to continue taking care of him, but in order to do that and the best that way that we can, he in our clinic need to have a professional and productive relationship.

## 2019-03-13 NOTE — Telephone Encounter (Signed)
Pt called to c/o about his dizziness and cataracts.  He has an appt with eye MD on Wednesday and thinks " this may help the dizziness".  After having an 25 minutes conversation with him was able to talk him into an appt for Thursday.  Christen Bame, CMA

## 2019-03-16 ENCOUNTER — Ambulatory Visit: Payer: Medicaid Other | Admitting: Family Medicine

## 2019-03-30 ENCOUNTER — Ambulatory Visit: Payer: Medicaid Other | Admitting: Family Medicine

## 2019-04-10 ENCOUNTER — Other Ambulatory Visit: Payer: Self-pay | Admitting: *Deleted

## 2019-04-10 DIAGNOSIS — F411 Generalized anxiety disorder: Secondary | ICD-10-CM

## 2019-04-10 DIAGNOSIS — M5441 Lumbago with sciatica, right side: Secondary | ICD-10-CM

## 2019-04-10 DIAGNOSIS — G8929 Other chronic pain: Secondary | ICD-10-CM

## 2019-04-10 MED ORDER — METHOCARBAMOL 750 MG PO TABS: 750.0000 mg | ORAL_TABLET | Freq: Three times a day (TID) | ORAL | 3 refills | Status: DC | PRN

## 2019-04-10 MED ORDER — SERTRALINE HCL 100 MG PO TABS: 200.0000 mg | ORAL_TABLET | Freq: Every day | ORAL | 3 refills | Status: DC

## 2019-04-18 ENCOUNTER — Ambulatory Visit (INDEPENDENT_AMBULATORY_CARE_PROVIDER_SITE_OTHER): Payer: Medicaid Other | Admitting: Family Medicine

## 2019-04-18 ENCOUNTER — Other Ambulatory Visit: Payer: Self-pay

## 2019-04-18 ENCOUNTER — Encounter: Payer: Self-pay | Admitting: Family Medicine

## 2019-04-18 VITALS — BP 128/80 | HR 121 | Ht 68.0 in | Wt 162.0 lb

## 2019-04-18 DIAGNOSIS — R0602 Shortness of breath: Secondary | ICD-10-CM

## 2019-04-18 DIAGNOSIS — Z23 Encounter for immunization: Secondary | ICD-10-CM | POA: Diagnosis not present

## 2019-04-18 DIAGNOSIS — Z1211 Encounter for screening for malignant neoplasm of colon: Secondary | ICD-10-CM | POA: Diagnosis not present

## 2019-04-18 DIAGNOSIS — Z13220 Encounter for screening for lipoid disorders: Secondary | ICD-10-CM | POA: Diagnosis not present

## 2019-04-18 DIAGNOSIS — R42 Dizziness and giddiness: Secondary | ICD-10-CM

## 2019-04-18 DIAGNOSIS — Z659 Problem related to unspecified psychosocial circumstances: Secondary | ICD-10-CM

## 2019-04-18 NOTE — Progress Notes (Signed)
Subjective:    Anthony [name] - 63 y.o. male MRN [id number]  Date of birth: 08-17-1955  CC:  Anthony [name] is here for his annual physical.  He would like to discuss his shortness of breath and dizziness.  HPI: Trouble breathing - feels like this is causing his dizziness - has canceled appointments here because he cannot physically make it due to his breathing - started during the past year - will have to take frequent breaks after showering or when walking - normally doesn't feel short of breath at rest - think he may have had the coronavirus earlier this year, was in a homeless shelter with a lot of people who were coughing - quit smoking decades ago  Dizziness with history of syncope - feels like his eyesight is poor, especially in his R eye since it wasn't tested for accuity after his surgery - had cataract removal surgery  - feels lightheaded when going from sitting to standing - Denies melanotic stool - Continues to drink alcohol, usually one 6-pack per day  Health Maintenance:  Would like a colonoscopy to be ordered today Health Maintenance Due  Topic Date Due  . COLONOSCOPY  09/28/2005    -  reports that he has never smoked. He has never used smokeless tobacco. - Review of Systems: Per HPI. - Past Medical History: Patient Active Problem List   Diagnosis Date Noted  . Alcohol abuse with alcohol-induced mood disorder ([place]) 07/18/2018  . Depression 05/26/2018  . Tooth infection 04/25/2018  . Anxiety state 04/25/2018  . Need for immunization against influenza 04/25/2018  . Lightheadedness 11/28/2017  . Pancytopenia ([place])   . Alcohol withdrawal ([place]) 09/01/2017  . Delirium tremens ([place]) 09/01/2017  . Altered mental status   . Dyspnea 11/27/2016  . Actinic keratosis 11/27/2016  . Macrocytic anemia 11/23/2016  . Dry eye 11/23/2016  . Right pulmonary embolus ([place]) 11/02/2016  . Hiatal hernia 09/14/2016  . Skin cancer 08/13/2016  . Toe pain 07/07/2013  . Poor  social situation 06/09/2013  . Tinea versicolor 06/08/2013  . Trigger finger, acquired 11/18/2012  . 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 HTN (hypertension) 04/11/2012  . Alcohol abuse 09/16/2006  . Chronic low back pain 09/16/2006   - Medications: reviewed and updated   Objective:   Physical Exam BP 128/80   Pulse (!) 121   Ht 5\' 8"  (1.727 m)   Wt 162 lb (73.5 kg)   SpO2 98%   BMI 24.63 kg/m  Gen: NAD, alert, cooperative with exam, slightly disheveled, pleasant CV: RRR, good S1/S2, no murmur, no edema  Resp: CTABL, no wheezes, non-labored Abd: SNTND, BS present, no guarding or organomegaly Skin: no rashes, normal turgor  Psych: Answers questions appropriately, although somewhat tangential  Depression screen [facility] 2/9 04/20/2019  Decreased Interest 3  Down, Depressed, Hopeless 2  PHQ - 2 Score 5  Altered sleeping 3  Tired, decreased energy 3  Change in appetite 3  Feeling bad or failure about yourself  0  Trouble concentrating 3  Moving slowly or fidgety/restless 0  Suicidal thoughts 0  PHQ-9 Score 17  Difficult doing work/chores Somewhat difficult   GAD 7 : Generalized Anxiety Score 04/20/2019 05/26/2018 09/22/2017  Nervous, Anxious, on Edge 3 3 1   Control/stop worrying 0 1 0  Worry too much - different things 0 2 0  Trouble relaxing 3 3 1   Restless 0 1 1  Easily annoyed or  irritable 2 3 1   Afraid - awful might happen 0 3 0  Total GAD 7 Score 8 16 4   Anxiety Difficulty Very difficult Very difficult Somewhat difficult      Assessment & Plan:   Dyspnea Pharyngeal is broad and includes obstructive lung disease, intrinsic lung disease, pneumonia, CHF, anemia, tuberculosis due to homelessness, among others.  We will start by obtaining a chest x-ray and CBC and CMP.  Patient has several chronic issues that need close follow-up, so I have scheduled a follow-up appointment with me in about 2 weeks.  We will review  his results at that time.  Lightheadedness May be due to his shortness of breath, although the differential is broad here as well.  Alcohol use disorder is likely also playing a role.  We will get initial labs as stated above and follow-up in 2 weeks.  Poor social situation Patient struggles with depression, anxiety, alcohol use disorder, and homelessness, although he is living in [place] currently.  Would like to have close follow-up with him and have scheduled a follow-up appointment for 2 weeks.  We will discuss his depression at that time as well.    Maia Breslow, M.D. 04/19/2019, 4:37 PM PGY-3, [place]

## 2019-04-18 NOTE — Patient Instructions (Addendum)
It was nice seeting you today Anthony [name]!  Today, we discussed your difficulty breathing and dizziness.  I would like to start off the work-up by getting labs today to check your blood count, kidney function, liver function, and electrolytes.  I would also like for you to get a chest x-ray.  Please go to [address] anytime from 9-5 Monday through Friday to get this done.  I would like to see you back on 10/15 at 2:30 PM.  Please bring your medications.  We will discuss your medications, at your loss of appetite, and your depression at that time.  I am also making a note that your vision and hearing are giving you trouble as well.  If you have any questions or concerns, please feel free to call the clinic.   Be well,  Dr. Shan Levans

## 2019-04-19 ENCOUNTER — Encounter: Payer: Self-pay | Admitting: Gastroenterology

## 2019-04-19 LAB — CBC
Hematocrit: 39.2 % (ref 37.5–51.0)
Hemoglobin: 13.7 g/dL (ref 13.0–17.7)
MCH: 36.1 pg — ABNORMAL HIGH (ref 26.6–33.0)
MCHC: 34.9 g/dL (ref 31.5–35.7)
MCV: 103 fL — ABNORMAL HIGH (ref 79–97)
Platelets: 223 10*3/uL (ref 150–450)
RBC: 3.8 x10E6/uL — ABNORMAL LOW (ref 4.14–5.80)
RDW: 14 % (ref 11.6–15.4)
WBC: 5.7 10*3/uL (ref 3.4–10.8)

## 2019-04-19 NOTE — Assessment & Plan Note (Addendum)
Pharyngeal is broad and includes obstructive lung disease, intrinsic lung disease, pneumonia, CHF, anemia, tuberculosis due to homelessness, among others.  We will start by obtaining a chest x-ray and CBC and CMP.  Patient has several chronic issues that need close follow-up, so I have scheduled a follow-up appointment with me in about 2 weeks.  We will review his results at that time.

## 2019-04-19 NOTE — Assessment & Plan Note (Signed)
Patient struggles with depression, anxiety, alcohol use disorder, and homelessness, although he is living in [place] currently.  Would like to have close follow-up with him and have scheduled a follow-up appointment for 2 weeks.  We will discuss his depression at that time as well.

## 2019-04-19 NOTE — Assessment & Plan Note (Signed)
May be due to his shortness of breath, although the differential is broad here as well.  Alcohol use disorder is likely also playing a role.  We will get initial labs as stated above and follow-up in 2 weeks.

## 2019-04-24 NOTE — Addendum Note (Signed)
Addended by: Maia Breslow C on: 04/24/2019 08:08 AM   Modules accepted: Level of Service

## 2019-05-04 ENCOUNTER — Ambulatory Visit: Payer: Medicaid Other | Admitting: Family Medicine

## 2019-05-17 ENCOUNTER — Ambulatory Visit (AMBULATORY_SURGERY_CENTER): Payer: Medicaid Other | Admitting: *Deleted

## 2019-05-17 ENCOUNTER — Other Ambulatory Visit: Payer: Self-pay

## 2019-05-17 VITALS — Temp 95.5°F | Ht 68.0 in | Wt 168.0 lb

## 2019-05-17 DIAGNOSIS — Z1211 Encounter for screening for malignant neoplasm of colon: Secondary | ICD-10-CM

## 2019-05-17 DIAGNOSIS — Z1159 Encounter for screening for other viral diseases: Secondary | ICD-10-CM

## 2019-05-17 MED ORDER — NA SULFATE-K SULFATE-MG SULF 17.5-3.13-1.6 GM/177ML PO SOLN: ORAL | 0 refills | Status: DC

## 2019-05-17 NOTE — Progress Notes (Signed)
Patient is here in-person for PV. Patient denies any allergies to eggs or soy. Patient denies any problems with anesthesia/sedation. Patient denies any oxygen use at home. Patient denies taking any diet/weight loss medications or blood thinners. Patient is not being treated for MRSA or C-diff. EMMI education assisgned to the patient for the procedure, this was explained and instructions given to patient. COVID-19 screening test is on 11/4 10:55 a.m., the pt is aware. Pt was told NOT to drink any alcohol, use drugs or tobacco the prep day or day of exam!  Pt is aware that care partner will wait in the car during procedure; if they feel like they will be too hot or cold to wait in the car; they may wait in the 4 th floor lobby. Patient is aware to bring only one care partner. We want them to wear a mask (we do not have any that we can provide them), practice social distancing, and we will check their temperatures when they get here.  I did remind the patient that their care partner needs to stay in the parking lot the entire time and have a cell phone available, we will call them when the pt is ready for discharge. Patient will wear mask into building.

## 2019-05-26 ENCOUNTER — Telehealth: Payer: Self-pay | Admitting: *Deleted

## 2019-05-26 NOTE — Telephone Encounter (Signed)
PT did not show up for his COVID test. Called patient to let him know that we would have to reschedule his appointment for Anthony [name]. Left message. SM

## 2019-05-29 ENCOUNTER — Encounter: Payer: Medicaid Other | Admitting: Gastroenterology

## 2019-05-30 NOTE — Telephone Encounter (Signed)
Pt's PCP called and stated that they had a 17-minute conversation with the patient.  He would like to reschedule colonoscopy that was canceled due to no show for COVID test.

## 2019-05-30 NOTE — Telephone Encounter (Signed)
Anthony [name], This was sent to [facility]

## 2019-05-30 NOTE — Telephone Encounter (Signed)
Called pt to RS his colon and covid test as per phone note No answer- LM to RC to PV to have a nurse RS his colon with another covid test

## 2019-05-31 NOTE — Telephone Encounter (Signed)
Called pt. No answer, unable to leave a message due to "mail box is full". Mailed letter to pt.

## 2019-07-22 DIAGNOSIS — S2249XA Multiple fractures of ribs, unspecified side, initial encounter for closed fracture: Secondary | ICD-10-CM

## 2019-07-22 HISTORY — DX: Multiple fractures of ribs, unspecified side, initial encounter for closed fracture: S22.49XA

## 2019-09-15 ENCOUNTER — Other Ambulatory Visit: Payer: Self-pay | Admitting: *Deleted

## 2019-09-15 ENCOUNTER — Other Ambulatory Visit: Payer: Self-pay | Admitting: Family Medicine

## 2019-09-15 DIAGNOSIS — G8929 Other chronic pain: Secondary | ICD-10-CM

## 2019-09-15 DIAGNOSIS — F411 Generalized anxiety disorder: Secondary | ICD-10-CM

## 2019-09-15 MED ORDER — HYDROCHLOROTHIAZIDE 25 MG PO TABS: 25.0000 mg | ORAL_TABLET | Freq: Every day | ORAL | 3 refills | Status: DC

## 2019-10-27 ENCOUNTER — Other Ambulatory Visit: Payer: Self-pay | Admitting: Family Medicine

## 2019-10-27 ENCOUNTER — Telehealth: Payer: Self-pay | Admitting: *Deleted

## 2019-10-27 DIAGNOSIS — Z659 Problem related to unspecified psychosocial circumstances: Secondary | ICD-10-CM

## 2019-10-27 NOTE — Telephone Encounter (Signed)
Pt called again.  Attempted to call back.  AGAIN received the same response. Christen Bame, CMA  Pt needs an appt to go over his medical situation.  Christen Bame, CMA

## 2019-10-27 NOTE — Telephone Encounter (Signed)
I agree.  Would you call and make an appointment for this patient?  He may also benefit from chronic care management social work.  I will make this referral.

## 2019-10-27 NOTE — Progress Notes (Signed)
CCM consult

## 2019-10-27 NOTE — Telephone Encounter (Signed)
Pt lmovm asking for a callback to "go over his medical situation with the doctor"  Attemtped to call, pt and schedule an appt. No answer and VM full. Christen Bame, CMA

## 2019-10-31 NOTE — Telephone Encounter (Signed)
LVM for pt to call office back to assist him in getting an appointment scheduled.April Zimmerman Rumple, CMA

## 2019-11-07 NOTE — Telephone Encounter (Signed)
LVM to call office to see about getting pt an appointment scheduled.  If he calls back please assist him in getting this scheduled.Aundrey Elahi Zimmerman Rumple, CMA

## 2019-11-15 ENCOUNTER — Telehealth: Payer: Self-pay

## 2019-11-15 DIAGNOSIS — F411 Generalized anxiety disorder: Secondary | ICD-10-CM

## 2019-11-15 NOTE — Telephone Encounter (Signed)
[facility] calls nurse line stating the Zoloft prescription that was transferred by [facility] appears to be an odd rx. The directions state, "take 200mg  PO for 4 weeks and then 200mg  thereafter." Please send in a new Zoloft prescription with appropriate dosing and directions.

## 2019-11-17 ENCOUNTER — Other Ambulatory Visit: Payer: Self-pay | Admitting: Family Medicine

## 2019-11-17 MED ORDER — SERTRALINE HCL 100 MG PO TABS: 200.0000 mg | ORAL_TABLET | Freq: Every day | ORAL | 11 refills | Status: DC

## 2019-11-17 NOTE — Telephone Encounter (Signed)
I have sent the new prescription.  Thanks.

## 2019-12-04 ENCOUNTER — Telehealth: Payer: Self-pay | Admitting: *Deleted

## 2019-12-04 ENCOUNTER — Other Ambulatory Visit: Payer: Self-pay

## 2019-12-04 NOTE — Telephone Encounter (Signed)
Pt walks in under the impression that he has an appointment for the covid vaccine here in our office.  When informed by Registration Leonard Downing) that we do not provide vaccines here patient started yelling that he knows we do and to get the supervisor.  Employee reports that he was loud and disruptive to other patients.  [place] came to get me and I pulled patient back from waiting room to sub-waiting. Marland Kitchen  He demands that we give him his covid vaccine.  I reiterated to patient that registration was correct and we do not have the vaccine here, nor do we test.  Patient then begins to yell again stating that "someone here scheduled me".  I asked him to lower his voice and advised that I would go to look into his chart.  Pt throws his hat down on the ground and says "you do that".  Upon review of chart I see that he is scheduled @ the [facility] site.  I wrote down the address and phone number for the patient.  Since this site is for testing and not the vaccine, I advised of this. He again started yelling about how we were incompetent and did not know what we were doing.  I asked him to lower his voice and which time he starts to whisper and says "does that make you feel better" in a mocking voice. Continued conversation with him about the vaccine vs testing. He reports not needing the test he only needs the vaccine.  At this point he is yelling again, then Dr. Andria Frames walked over to provide assistance.  Patient did briefly lower his voice but was still dissatisfied.  Patient states that since his appt was @ 9am he would never make it.  Dr. Andria Frames ask that I call and see if this would be an issue since they accept walkins.  Spoke with [facility], who confirmed that walkins are welcome.  Walked back over to patient, who was already on the phone with [place] and again speaking very loudly and rudely to employee on the phone.  I again asked him lower his voice, which was ignored.  Dr. Andria Frames came back (as patient could  be heard in the preceptor room) and asked him to lower his voice.  At this time patient screams as loud as he could "NO, I WILL NOT".  At this time we informed patient that we would be calling security.  He says "ok, go ahead" and finished his conversation on the phone.   Once security and [employer] arrived patient was still angry but did leave the campus after stating that the cops were "gonna pull him out of here and pull a gun on him".  None of the two afore mentioned things happened.

## 2019-12-04 NOTE — Telephone Encounter (Signed)
Agree this is an accurate description of the events.  To be clear, the patient was not threatening to me or any staff.  He was loud and disruptive - behavior that was uncomfortable for the other patients being seen in the [facility] at that time.

## 2019-12-04 NOTE — Telephone Encounter (Signed)
Thank you both for your feedback. I have left an HIPAA compliant callback message for this patient.  Based on your report, he met the "Tier 1" category of our IPBC policy.   I will discuss this with him and send him a certified letter. Let me know if you have additional feedback or questions.

## 2019-12-05 NOTE — Telephone Encounter (Signed)
I spoke to the pt while he was at [facility]. Pt would like a call back from my manger about being scheduled for an appt today and not being told where to go.  Pt thought he was getting a vaccine appt and was scheduled for a covid lab. Pt went to [facility] and was very agitated he was at wrong location. Pt was actually escorted out of there by security because he was so upset, loud and disruptive Pt has to ride the bus to get anywhere, and now he is especially aggravated he rode the bus to [place].  Pt asked me to make sure I sent to a manager the information he was not given address where to go.   I could hear security in the background, trying to escort him out, and he was cussing at them.  Saying very ugly remarks and yelling. Pt did not want to listen to anything I had to say.  I told him I was going to cancel the lab appt he had.  He said you better not.  I tried to explain this was not the vaccine appt.  I did advise pt he could walk in at the [facility] for the vaccine.   All the information recorded here is accurate from what I could hear on my end. Either way, he was not very nice to me either.

## 2019-12-06 ENCOUNTER — Telehealth: Payer: Self-pay | Admitting: Family Medicine

## 2019-12-06 NOTE — Telephone Encounter (Signed)
Patient LVM returning a call. I will not call him back as he is always aggressive and rude. Will forward to [place].

## 2019-12-06 NOTE — Telephone Encounter (Signed)
Hello Anthony [name],  It was Anthony [name] who routed the message to you.  I did call Anthony [name] back and discussed our low tolerance for mistreatment of our staff.  I also mailed Anthony [name] a certified warning letter.   He explained that he was upset about being escort out of the clinic by [employer] security. He confirmed that he was talking in a loud voice because he was upset and agitated.   He is aware of our dismissal policy regarding poor communication with our staff.

## 2019-12-06 NOTE — Telephone Encounter (Signed)
-----   Message from Kinnie Feil, MD sent at 12/04/2019  2:41 PM EDT ----- Regarding: IPBC

## 2019-12-06 NOTE — Telephone Encounter (Signed)
HIPAA compliant callback message left.  Since I have attempted to reach him twice with no response, I will mail him a certified letter regarding IPCB and risk for dismissal from our practice.

## 2019-12-07 NOTE — Telephone Encounter (Signed)
LVM for pt to call office back to assist him in getting an appointment scheduled with PCP.Kessler Solly Tacy Dura Rumple, CMA    Kinnie Feil, MD  Kathrene Alu, MD; [facility] Rn Team 22 hours ago (12:08 PM)   Hello RN team,   Patient request contact regarding his referral for blood work. Please contact him today to help schedule follow-up with his PCP. Thanks.   Routing comment

## 2019-12-08 DIAGNOSIS — Z23 Encounter for immunization: Secondary | ICD-10-CM | POA: Diagnosis not present

## 2019-12-11 NOTE — Telephone Encounter (Signed)
LVM to inform pt of below and to schedule him appointment with PCP also to see if he is changing pharmacies.  Received request for hydrochlorothiazide from [facility] and it looks like pt has refills at the [facility].  Please clarify which one he is using if he calls back. Maverik Foot Zimmerman Rumple, CMA

## 2019-12-20 ENCOUNTER — Telehealth: Payer: Self-pay

## 2019-12-20 NOTE — Telephone Encounter (Signed)
Pt has appointment with Dr. Criss Rosales on 01/01/2020. Closing this encounter.Anthony [name], CMA

## 2019-12-20 NOTE — Telephone Encounter (Signed)
Patient LVM on nurse line stating he needs medications. I attempted to call him back for further information, however I had to LVM.

## 2020-01-01 ENCOUNTER — Ambulatory Visit: Payer: Medicaid Other | Admitting: Family Medicine

## 2020-01-04 DIAGNOSIS — Z23 Encounter for immunization: Secondary | ICD-10-CM | POA: Diagnosis not present

## 2020-03-19 ENCOUNTER — Other Ambulatory Visit: Payer: Self-pay | Admitting: *Deleted

## 2020-03-19 DIAGNOSIS — G8929 Other chronic pain: Secondary | ICD-10-CM

## 2020-03-20 MED ORDER — METHOCARBAMOL 750 MG PO TABS: 750.0000 mg | ORAL_TABLET | Freq: Three times a day (TID) | ORAL | 3 refills | Status: DC | PRN

## 2020-07-21 ENCOUNTER — Emergency Department (HOSPITAL_COMMUNITY): Payer: Medicaid Other

## 2020-07-21 ENCOUNTER — Other Ambulatory Visit: Payer: Self-pay

## 2020-07-21 ENCOUNTER — Inpatient Hospital Stay (HOSPITAL_COMMUNITY)
Admission: EM | Admit: 2020-07-21 | Discharge: 2020-08-09 | DRG: 897 | Disposition: A | Discharge status: Home / Self Care | Payer: Medicaid Other | Attending: Family Medicine | Admitting: Family Medicine

## 2020-07-21 DIAGNOSIS — Z8249 Family history of ischemic heart disease and other diseases of the circulatory system: Secondary | ICD-10-CM

## 2020-07-21 DIAGNOSIS — Z5320 Procedure and treatment not carried out because of patient's decision for unspecified reasons: Secondary | ICD-10-CM | POA: Diagnosis present

## 2020-07-21 DIAGNOSIS — E876 Hypokalemia: Secondary | ICD-10-CM | POA: Diagnosis not present

## 2020-07-21 DIAGNOSIS — Z7901 Long term (current) use of anticoagulants: Secondary | ICD-10-CM

## 2020-07-21 DIAGNOSIS — J302 Other seasonal allergic rhinitis: Secondary | ICD-10-CM | POA: Diagnosis present

## 2020-07-21 DIAGNOSIS — S2242XA Multiple fractures of ribs, left side, initial encounter for closed fracture: Secondary | ICD-10-CM | POA: Diagnosis not present

## 2020-07-21 DIAGNOSIS — R748 Abnormal levels of other serum enzymes: Secondary | ICD-10-CM | POA: Diagnosis present

## 2020-07-21 DIAGNOSIS — Z720 Tobacco use: Secondary | ICD-10-CM

## 2020-07-21 DIAGNOSIS — F418 Other specified anxiety disorders: Secondary | ICD-10-CM | POA: Diagnosis present

## 2020-07-21 DIAGNOSIS — F10231 Alcohol dependence with withdrawal delirium: Principal | ICD-10-CM | POA: Diagnosis present

## 2020-07-21 DIAGNOSIS — R7401 Elevation of levels of liver transaminase levels: Secondary | ICD-10-CM | POA: Diagnosis present

## 2020-07-21 DIAGNOSIS — K922 Gastrointestinal hemorrhage, unspecified: Secondary | ICD-10-CM | POA: Diagnosis present

## 2020-07-21 DIAGNOSIS — G9389 Other specified disorders of brain: Secondary | ICD-10-CM | POA: Diagnosis not present

## 2020-07-21 DIAGNOSIS — I5032 Chronic diastolic (congestive) heart failure: Secondary | ICD-10-CM | POA: Diagnosis present

## 2020-07-21 DIAGNOSIS — F10232 Alcohol dependence with withdrawal with perceptual disturbance: Secondary | ICD-10-CM | POA: Diagnosis not present

## 2020-07-21 DIAGNOSIS — E8809 Other disorders of plasma-protein metabolism, not elsewhere classified: Secondary | ICD-10-CM | POA: Diagnosis present

## 2020-07-21 DIAGNOSIS — R000 Tachycardia, unspecified: Secondary | ICD-10-CM | POA: Diagnosis not present

## 2020-07-21 DIAGNOSIS — D539 Nutritional anemia, unspecified: Secondary | ICD-10-CM | POA: Diagnosis present

## 2020-07-21 DIAGNOSIS — G319 Degenerative disease of nervous system, unspecified: Secondary | ICD-10-CM | POA: Diagnosis not present

## 2020-07-21 DIAGNOSIS — E860 Dehydration: Secondary | ICD-10-CM | POA: Diagnosis not present

## 2020-07-21 DIAGNOSIS — R402252 Coma scale, best verbal response, oriented, at arrival to emergency department: Secondary | ICD-10-CM | POA: Diagnosis present

## 2020-07-21 DIAGNOSIS — F10239 Alcohol dependence with withdrawal, unspecified: Secondary | ICD-10-CM | POA: Diagnosis present

## 2020-07-21 DIAGNOSIS — L219 Seborrheic dermatitis, unspecified: Secondary | ICD-10-CM | POA: Diagnosis present

## 2020-07-21 DIAGNOSIS — Z823 Family history of stroke: Secondary | ICD-10-CM

## 2020-07-21 DIAGNOSIS — Z659 Problem related to unspecified psychosocial circumstances: Secondary | ICD-10-CM

## 2020-07-21 DIAGNOSIS — E162 Hypoglycemia, unspecified: Secondary | ICD-10-CM | POA: Diagnosis present

## 2020-07-21 DIAGNOSIS — R823 Hemoglobinuria: Secondary | ICD-10-CM | POA: Diagnosis present

## 2020-07-21 DIAGNOSIS — E872 Acidosis: Secondary | ICD-10-CM | POA: Diagnosis present

## 2020-07-21 DIAGNOSIS — I480 Paroxysmal atrial fibrillation: Secondary | ICD-10-CM | POA: Diagnosis present

## 2020-07-21 DIAGNOSIS — Y900 Blood alcohol level of less than 20 mg/100 ml: Secondary | ICD-10-CM | POA: Diagnosis present

## 2020-07-21 DIAGNOSIS — S0990XA Unspecified injury of head, initial encounter: Secondary | ICD-10-CM | POA: Diagnosis not present

## 2020-07-21 DIAGNOSIS — R402362 Coma scale, best motor response, obeys commands, at arrival to emergency department: Secondary | ICD-10-CM | POA: Diagnosis present

## 2020-07-21 DIAGNOSIS — I110 Hypertensive heart disease with heart failure: Secondary | ICD-10-CM | POA: Diagnosis present

## 2020-07-21 DIAGNOSIS — R440 Auditory hallucinations: Secondary | ICD-10-CM | POA: Diagnosis present

## 2020-07-21 DIAGNOSIS — Z20822 Contact with and (suspected) exposure to covid-19: Secondary | ICD-10-CM | POA: Diagnosis present

## 2020-07-21 DIAGNOSIS — Z85828 Personal history of other malignant neoplasm of skin: Secondary | ICD-10-CM

## 2020-07-21 DIAGNOSIS — R710 Precipitous drop in hematocrit: Secondary | ICD-10-CM | POA: Diagnosis present

## 2020-07-21 DIAGNOSIS — F411 Generalized anxiety disorder: Secondary | ICD-10-CM | POA: Diagnosis present

## 2020-07-21 DIAGNOSIS — R9082 White matter disease, unspecified: Secondary | ICD-10-CM | POA: Diagnosis not present

## 2020-07-21 DIAGNOSIS — R269 Unspecified abnormalities of gait and mobility: Secondary | ICD-10-CM | POA: Diagnosis present

## 2020-07-21 DIAGNOSIS — Z809 Family history of malignant neoplasm, unspecified: Secondary | ICD-10-CM

## 2020-07-21 DIAGNOSIS — W19XXXA Unspecified fall, initial encounter: Secondary | ICD-10-CM | POA: Diagnosis present

## 2020-07-21 DIAGNOSIS — Z23 Encounter for immunization: Secondary | ICD-10-CM

## 2020-07-21 DIAGNOSIS — E46 Unspecified protein-calorie malnutrition: Secondary | ICD-10-CM | POA: Diagnosis present

## 2020-07-21 DIAGNOSIS — F1020 Alcohol dependence, uncomplicated: Secondary | ICD-10-CM | POA: Diagnosis present

## 2020-07-21 DIAGNOSIS — I4892 Unspecified atrial flutter: Secondary | ICD-10-CM | POA: Diagnosis present

## 2020-07-21 DIAGNOSIS — G8929 Other chronic pain: Secondary | ICD-10-CM | POA: Diagnosis present

## 2020-07-21 DIAGNOSIS — Z86711 Personal history of pulmonary embolism: Secondary | ICD-10-CM

## 2020-07-21 DIAGNOSIS — F10939 Alcohol use, unspecified with withdrawal, unspecified: Secondary | ICD-10-CM | POA: Diagnosis present

## 2020-07-21 DIAGNOSIS — R4781 Slurred speech: Secondary | ICD-10-CM | POA: Diagnosis present

## 2020-07-21 DIAGNOSIS — R319 Hematuria, unspecified: Secondary | ICD-10-CM | POA: Diagnosis present

## 2020-07-21 DIAGNOSIS — Z79899 Other long term (current) drug therapy: Secondary | ICD-10-CM

## 2020-07-21 DIAGNOSIS — Z5901 Sheltered homelessness: Secondary | ICD-10-CM

## 2020-07-21 DIAGNOSIS — F1024 Alcohol dependence with alcohol-induced mood disorder: Secondary | ICD-10-CM | POA: Diagnosis present

## 2020-07-21 DIAGNOSIS — R54 Age-related physical debility: Secondary | ICD-10-CM | POA: Diagnosis present

## 2020-07-21 DIAGNOSIS — Z87898 Personal history of other specified conditions: Secondary | ICD-10-CM

## 2020-07-21 DIAGNOSIS — E611 Iron deficiency: Secondary | ICD-10-CM | POA: Diagnosis present

## 2020-07-21 DIAGNOSIS — R0602 Shortness of breath: Secondary | ICD-10-CM | POA: Diagnosis not present

## 2020-07-21 DIAGNOSIS — R402142 Coma scale, eyes open, spontaneous, at arrival to emergency department: Secondary | ICD-10-CM | POA: Diagnosis present

## 2020-07-21 DIAGNOSIS — M6282 Rhabdomyolysis: Secondary | ICD-10-CM | POA: Diagnosis present

## 2020-07-21 DIAGNOSIS — F10931 Alcohol use, unspecified with withdrawal delirium: Secondary | ICD-10-CM

## 2020-07-21 HISTORY — DX: Other pancytopenia: D61.818

## 2020-07-21 LAB — COMPREHENSIVE METABOLIC PANEL
ALT: 33 U/L (ref 0–44)
AST: 81 U/L — ABNORMAL HIGH (ref 15–41)
Albumin: 2.5 g/dL — ABNORMAL LOW (ref 3.5–5.0)
Alkaline Phosphatase: 176 U/L — ABNORMAL HIGH (ref 38–126)
Anion gap: 13 (ref 5–15)
BUN: 7 mg/dL — ABNORMAL LOW (ref 8–23)
CO2: 18 mmol/L — ABNORMAL LOW (ref 22–32)
Calcium: 8.5 mg/dL — ABNORMAL LOW (ref 8.9–10.3)
Chloride: 106 mmol/L (ref 98–111)
Creatinine, Ser: 1.05 mg/dL (ref 0.61–1.24)
GFR, Estimated: >60 mL/min (ref >60)
Glucose, Bld: 81 mg/dL (ref 70–99)
Potassium: 4.2 mmol/L (ref 3.5–5.1)
Sodium: 137 mmol/L (ref 135–145)
Total Bilirubin: 1.2 mg/dL (ref 0.3–1.2)
Total Protein: 6.4 g/dL — ABNORMAL LOW (ref 6.5–8.1)

## 2020-07-21 LAB — CBC WITH DIFFERENTIAL/PLATELET
Abs Immature Granulocytes: 0.11 10*3/uL — ABNORMAL HIGH (ref 0.00–0.07)
Basophils Absolute: 0 10*3/uL (ref 0.0–0.1)
Basophils Relative: 0 %
Eosinophils Absolute: 0 10*3/uL (ref 0.0–0.5)
Eosinophils Relative: 0 %
HCT: 33.2 % — ABNORMAL LOW (ref 39.0–52.0)
Hemoglobin: 11.5 g/dL — ABNORMAL LOW (ref 13.0–17.0)
Immature Granulocytes: 1 %
Lymphocytes Relative: 4 %
Lymphs Abs: 0.5 10*3/uL — ABNORMAL LOW (ref 0.7–4.0)
MCH: 37.5 pg — ABNORMAL HIGH (ref 26.0–34.0)
MCHC: 34.6 g/dL (ref 30.0–36.0)
MCV: 108.1 fL — ABNORMAL HIGH (ref 80.0–100.0)
Monocytes Absolute: 0.8 10*3/uL (ref 0.1–1.0)
Monocytes Relative: 6 %
Neutro Abs: 11.3 10*3/uL — ABNORMAL HIGH (ref 1.7–7.7)
Neutrophils Relative %: 89 %
Platelets: 276 10*3/uL (ref 150–400)
RBC: 3.07 MIL/uL — ABNORMAL LOW (ref 4.22–5.81)
RDW: 12.4 % (ref 11.5–15.5)
WBC: 12.8 10*3/uL — ABNORMAL HIGH (ref 4.0–10.5)
nRBC: 0 % (ref 0.0–0.2)

## 2020-07-21 LAB — CBG MONITORING, ED
Glucose-Capillary: 68 mg/dL — ABNORMAL LOW (ref 70–99)
Glucose-Capillary: 102 mg/dL — ABNORMAL HIGH (ref 70–99)

## 2020-07-21 LAB — RAPID URINE DRUG SCREEN, HOSP PERFORMED
Amphetamines: NOT DETECTED
Barbiturates: NOT DETECTED
Benzodiazepines: NOT DETECTED
Cocaine: NOT DETECTED
Opiates: NOT DETECTED
Tetrahydrocannabinol: NOT DETECTED

## 2020-07-21 LAB — RESP PANEL BY RT-PCR (FLU A&B, COVID) ARPGX2
Influenza A by PCR: NEGATIVE
Influenza B by PCR: NEGATIVE
SARS Coronavirus 2 by RT PCR: NEGATIVE

## 2020-07-21 LAB — URINALYSIS, ROUTINE W REFLEX MICROSCOPIC
Bacteria, UA: NONE SEEN
Bilirubin Urine: NEGATIVE
Glucose, UA: NEGATIVE mg/dL
Ketones, ur: NEGATIVE mg/dL
Leukocytes,Ua: NEGATIVE
Nitrite: NEGATIVE
Protein, ur: 30 mg/dL — AB
Specific Gravity, Urine: 1.024 (ref 1.005–1.030)
pH: 5 (ref 5.0–8.0)

## 2020-07-21 LAB — LACTIC ACID, PLASMA
Lactic Acid, Venous: 2.8 mmol/L (ref 0.5–1.9)
Lactic Acid, Venous: 1.1 mmol/L (ref 0.5–1.9)

## 2020-07-21 LAB — ETHANOL: Alcohol, Ethyl (B): <10 mg/dL (ref <10)

## 2020-07-21 LAB — MAGNESIUM: Magnesium: 1.5 mg/dL — ABNORMAL LOW (ref 1.7–2.4)

## 2020-07-21 LAB — PHOSPHORUS: Phosphorus: 4.4 mg/dL (ref 2.5–4.6)

## 2020-07-21 LAB — CK: Total CK: 890 U/L — ABNORMAL HIGH (ref 49–397)

## 2020-07-21 MED ORDER — AMLODIPINE BESYLATE 5 MG PO TABS
5.0000 mg | ORAL_TABLET | Freq: Every day | ORAL | Status: DC
  Administered 2020-07-22 – 2020-07-30 (×9): 5 mg via ORAL
  Filled 2020-07-21 (×10): qty 1

## 2020-07-21 MED ORDER — HYDROCHLOROTHIAZIDE 25 MG PO TABS
25.0000 mg | ORAL_TABLET | Freq: Every day | ORAL | Status: DC
  Administered 2020-07-22 – 2020-07-31 (×10): 25 mg via ORAL
  Filled 2020-07-21 (×11): qty 1

## 2020-07-21 MED ORDER — DEXTROSE 50 % IV SOLN: 50.0000 mL | Freq: Once | INTRAVENOUS | Status: DC

## 2020-07-21 MED ORDER — LORAZEPAM 2 MG/ML IJ SOLN
1.0000 mg | INTRAMUSCULAR | Status: AC | PRN
  Administered 2020-07-22 – 2020-07-23 (×4): 2 mg via INTRAVENOUS
  Administered 2020-07-23: 1 mg via INTRAVENOUS
  Filled 2020-07-21 (×3): qty 1

## 2020-07-21 MED ORDER — ACETAMINOPHEN 500 MG PO TABS
1000.0000 mg | ORAL_TABLET | Freq: Once | ORAL | Status: AC
  Administered 2020-07-21: 1000 mg via ORAL
  Filled 2020-07-21: qty 2

## 2020-07-21 MED ORDER — SODIUM CHLORIDE 0.9 % IV SOLN: INTRAVENOUS | Status: AC

## 2020-07-21 MED ORDER — THIAMINE HCL 100 MG/ML IJ SOLN: 100.0000 mg | Freq: Every day | INTRAMUSCULAR | Status: DC

## 2020-07-21 MED ORDER — FOLIC ACID 1 MG PO TABS
1.0000 mg | ORAL_TABLET | Freq: Every day | ORAL | Status: DC
  Administered 2020-07-21 – 2020-08-09 (×20): 1 mg via ORAL
  Filled 2020-07-21 (×20): qty 1

## 2020-07-21 MED ORDER — ADULT MULTIVITAMIN W/MINERALS CH
1.0000 | ORAL_TABLET | Freq: Every day | ORAL | Status: DC
  Administered 2020-07-21: 1 via ORAL
  Filled 2020-07-21: qty 1

## 2020-07-21 MED ORDER — THIAMINE HCL 100 MG/ML IJ SOLN
100.0000 mg | Freq: Every day | INTRAMUSCULAR | Status: DC
  Filled 2020-07-21: qty 2

## 2020-07-21 MED ORDER — POLYVINYL ALCOHOL 1.4 % OP SOLN
1.0000 [drp] | Freq: Three times a day (TID) | OPHTHALMIC | Status: DC | PRN
  Administered 2020-07-31: 1 [drp] via OPHTHALMIC
  Filled 2020-07-21: qty 15

## 2020-07-21 MED ORDER — THIAMINE HCL 100 MG PO TABS
100.0000 mg | ORAL_TABLET | Freq: Every day | ORAL | Status: DC
  Administered 2020-07-21: 100 mg via ORAL
  Filled 2020-07-21: qty 1

## 2020-07-21 MED ORDER — SODIUM CHLORIDE 0.9 % IV SOLN
1000.0000 mL | Freq: Once | INTRAVENOUS | Status: AC
  Administered 2020-07-21: 1000 mL via INTRAVENOUS

## 2020-07-21 MED ORDER — LORAZEPAM 1 MG PO TABS
1.0000 mg | ORAL_TABLET | ORAL | Status: AC | PRN
Start: 2020-07-21 — End: 2020-07-24
  Administered 2020-07-23: 1 mg via ORAL
  Filled 2020-07-21: qty 1

## 2020-07-21 MED ORDER — LORATADINE 10 MG PO TABS
10.0000 mg | ORAL_TABLET | Freq: Every day | ORAL | Status: DC
  Administered 2020-07-22 – 2020-08-09 (×19): 10 mg via ORAL
  Filled 2020-07-21 (×20): qty 1

## 2020-07-21 MED ORDER — SERTRALINE HCL 100 MG PO TABS
200.0000 mg | ORAL_TABLET | Freq: Every day | ORAL | Status: DC
  Administered 2020-07-22 – 2020-08-04 (×14): 200 mg via ORAL
  Administered 2020-08-05: 100 mg via ORAL
  Administered 2020-08-06 – 2020-08-09 (×4): 200 mg via ORAL
  Filled 2020-07-21 (×19): qty 2

## 2020-07-21 MED ORDER — FOLIC ACID 1 MG PO TABS
1.0000 mg | ORAL_TABLET | Freq: Every day | ORAL | Status: DC
  Administered 2020-07-21: 1 mg via ORAL
  Filled 2020-07-21: qty 1

## 2020-07-21 MED ORDER — THIAMINE HCL 100 MG PO TABS
100.0000 mg | ORAL_TABLET | Freq: Every day | ORAL | Status: DC
  Administered 2020-07-21 – 2020-07-22 (×2): 100 mg via ORAL
  Filled 2020-07-21 (×2): qty 1

## 2020-07-21 MED ORDER — SODIUM CHLORIDE 0.9 % IV BOLUS
1000.0000 mL | Freq: Once | INTRAVENOUS | Status: AC
  Administered 2020-07-21: 1000 mL via INTRAVENOUS

## 2020-07-21 MED ORDER — FERROUS SULFATE 325 (65 FE) MG PO TABS
325.0000 mg | ORAL_TABLET | ORAL | Status: DC
  Administered 2020-07-22 – 2020-08-08 (×6): 325 mg via ORAL
  Filled 2020-07-21 (×6): qty 1

## 2020-07-21 MED ORDER — ENOXAPARIN SODIUM 40 MG/0.4ML ~~LOC~~ SOLN
40.0000 mg | SUBCUTANEOUS | Status: DC
  Administered 2020-07-21 – 2020-07-25 (×5): 40 mg via SUBCUTANEOUS
  Filled 2020-07-21 (×5): qty 0.4

## 2020-07-21 MED ORDER — ADULT MULTIVITAMIN W/MINERALS CH
1.0000 | ORAL_TABLET | Freq: Every day | ORAL | Status: DC
  Administered 2020-07-21 – 2020-08-09 (×20): 1 via ORAL
  Filled 2020-07-21 (×20): qty 1

## 2020-07-21 MED ORDER — LORAZEPAM 1 MG PO TABS: 1.0000 mg | ORAL_TABLET | ORAL | Status: DC | PRN

## 2020-07-21 MED ORDER — LORAZEPAM 2 MG/ML IJ SOLN
1.0000 mg | INTRAMUSCULAR | Status: DC | PRN
  Filled 2020-07-21 (×2): qty 1

## 2020-07-21 NOTE — ED Notes (Signed)
Pt at xray

## 2020-07-21 NOTE — ED Provider Notes (Signed)
[facility] EMERGENCY DEPARTMENT Provider Note   CSN: [id number] Arrival date & time: 07/21/20  0932     History Chief Complaint  Patient presents with  . Altered Mental Status    Pt was found outside .... EMS was called found pt facedown in grass. Pt states he isnt sure what happened. States he has rib pain, drinks 12 pack a day but only had 1 beer today. Pt heart rate 130's    Anthony [name] is a 65 y.o. male.  The history is provided by the patient and medical records. No language interpreter was used.  Altered Mental Status    A 65 year old male significant history of alcohol abuse, hypertension, prior DT brought here via EMS when he was found outside face down in the grass.  Patient however is able to provide history.  Patient mention he was on his way to the grocery store to get groceries today.  He was outside in the rain at the bus stop and decided to lay on the grass as it was comfortable.  He is unable to tell me how long he was on the ground for but ultimately was brought here for evaluation.  He denies any fall but did report several weeks ago he did fell and landed on the left side of his chest and has been having some left-sided rib pain.  He also has some chronic pain.  He admits to drinking approximately 12 beers a day but only had 1 beer today.  He does feel slightly tremulous but denies any severe headache, chest pain, trouble breathing, focal numbness or focal weakness.  He denies feeling confused.  He admits he has been losing weight and was planning to see his PCP evaluation.  Denies any drug use.  He has been vaccinated for Covid.  No complaints of nausea vomiting diarrhea.  He did report having some congestion and throat irritation several days prior.  Past Medical History:  Diagnosis Date  . Alcoholism (HCC)   . Depression   . Hydrocele    Surgically corrected  . Hypertension   . Lumbar compression fracture (HCC)   . Skin cancer    exciced 2017     Patient Active Problem List   Diagnosis Date Noted  . Alcohol abuse with alcohol-induced mood disorder (HCC) 07/18/2018  . Depression 05/26/2018  . Tooth infection 04/25/2018  . Anxiety state 04/25/2018  . Need for immunization against influenza 04/25/2018  . Lightheadedness 11/28/2017  . Pancytopenia (HCC)   . Alcohol withdrawal (HCC) 09/01/2017  . Delirium tremens (HCC) 09/01/2017  . Altered mental status   . Dyspnea 11/27/2016  . Actinic keratosis 11/27/2016  . Macrocytic anemia 11/23/2016  . Dry eye 11/23/2016  . Right pulmonary embolus (HCC) 11/02/2016  . Hiatal hernia 09/14/2016  . Skin cancer 08/13/2016  . Toe pain 07/07/2013  . Poor social situation 06/09/2013  . Tinea versicolor 06/08/2013  . Trigger finger, acquired 11/18/2012  . 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 HTN (hypertension) 04/11/2012  . Alcohol abuse 09/16/2006  . Chronic low back pain 09/16/2006    Past Surgical History:  Procedure Laterality Date  . CATARACT EXTRACTION    . HIATAL HERNIA REPAIR  2016  . HYDROCELE EXCISION / REPAIR  2010  . SKIN CANCER EXCISION Left    Excised 2017       Family History  Problem Relation Age of Onset  . Heart attack Father  died at 40 years  . Dementia Father   . Stroke Father   . Cancer Sister   . Colon cancer Neg Hx   . Colon polyps Neg Hx   . Esophageal cancer Neg Hx   . Stomach cancer Neg Hx   . Rectal cancer Neg Hx     Social History   Tobacco Use  . Smoking status: Never Smoker  . Smokeless tobacco: Current User    Types: Snuff  Vaping Use  . Vaping Use: Never used  Substance Use Topics  . Alcohol use: Yes    Alcohol/week: 43.0 standard drinks    Types: 42 Cans of beer, 1 Standard drinks or equivalent per week    Comment: daily use-6 beers daily and whiskey when he has it.  . Drug use: Yes    Types: Marijuana    Comment: occasional-none this year per pt    Home  Medications Prior to Admission medications   Medication Sig Start Date End Date Taking? Authorizing Provider  acetaminophen (TYLENOL) 650 MG CR tablet Take 1 tablet (650 mg total) by mouth every 8 (eight) hours as needed for pain. Patient taking differently: Take 1,300 mg by mouth 2 (two) times daily as needed for pain.  01/07/18   Raiford Noble Latif, DO  amLODipine (NORVASC) 5 MG tablet Take 1 tablet (5 mg total) by mouth daily. 09/23/18   Sherene Sires, DO  b complex vitamins capsule Take 1 capsule by mouth daily.    [provider]  cetirizine (ZYRTEC) 10 MG tablet Take 1 tablet (10 mg total) by mouth daily. 01/07/18   Raiford Noble Latif, DO  ferrous sulfate 325 (65 FE) MG tablet Take 1 tablet (325 mg total) by mouth 2 (two) times a week. 09/26/18   Sherene Sires, DO  hydrochlorothiazide (HYDRODIURIL) 25 MG tablet Take 1 tablet (25 mg total) by mouth daily. 09/15/19   Kathrene Alu, MD  hydroxypropyl methylcellulose / hypromellose (ISOPTO TEARS / GONIOVISC) 2.5 % ophthalmic solution Place 1 drop into both eyes as needed for dry eyes. 01/07/18   Raiford Noble Latif, DO  methocarbamol (ROBAXIN) 750 MG tablet Take 1 tablet (750 mg total) by mouth every 8 (eight) hours as needed. 03/20/20   Carollee Leitz, MD  Multiple Vitamin (MULTIVITAMIN WITH MINERALS) TABS tablet Take 1 tablet by mouth 2 (two) times a week. 01/10/18   Sheikh, Omair Latif, DO  Na Sulfate-K Sulfate-Mg Sulf 17.5-3.13-1.6 GM/177ML SOLN Suprep (no substitutions)-TAKE AS DIRECTED. 05/17/19   Ladene Artist, MD  sertraline (ZOLOFT) 100 MG tablet Take 2 tablets (200 mg total) by mouth daily. 11/17/19   Kathrene Alu, MD    Allergies    Patient has no known allergies.  Review of Systems   Review of Systems  All other systems reviewed and are negative.   Physical Exam Updated Vital Signs BP (!) 119/91   Pulse (!) 114   Temp 99.4 F (37.4 C) (Rectal)   Resp (!) 24   Wt 74.8 kg   SpO2 99%   BMI 25.09 kg/m   Physical  Exam Vitals and nursing note reviewed.  Constitutional:      General: He is not in acute distress.    Appearance: He is well-developed and well-nourished.     Comments: Elderly male, wet hair, skin is wet, and his back is covered in leaves  HENT:     Head: Atraumatic.  Eyes:     Extraocular Movements: Extraocular movements intact.  Conjunctiva/sclera: Conjunctivae normal.     Pupils: Pupils are equal, round, and reactive to light.     Comments: Pupils are dilated at 5 mm bilaterally but reactive and equal  Cardiovascular:     Rate and Rhythm: Tachycardia present.     Pulses: Normal pulses.     Heart sounds: Normal heart sounds.  Pulmonary:     Effort: Pulmonary effort is normal.     Breath sounds: Normal breath sounds. No wheezing, rhonchi or rales.  Chest:     Chest wall: Tenderness (Tenderness to the left anterior chest wall without crepitus or emphysema.) present.  Abdominal:     Palpations: Abdomen is soft.     Tenderness: There is no abdominal tenderness.  Musculoskeletal:     Cervical back: Normal range of motion and neck supple. No tenderness.     Comments: Able to move all 4 extremities with equal but poor effort  Skin:    Capillary Refill: Capillary refill takes less than 2 seconds.     Coloration: Skin is pale.     Findings: No rash.  Neurological:     Mental Status: He is alert and oriented to person, place, and time.     GCS: GCS eye subscore is 4. GCS verbal subscore is 5. GCS motor subscore is 6.     Cranial Nerves: Cranial nerves are intact.     Sensory: Sensation is intact.     Motor: Weakness and tremor present.     Comments: Is alert and oriented x4  Psychiatric:        Mood and Affect: Mood and affect and mood normal.     ED Results / Procedures / Treatments   Labs (all labs ordered are listed, but only abnormal results are displayed) Labs Reviewed  CBC WITH DIFFERENTIAL/PLATELET - Abnormal; Notable for the following components:      Result Value    WBC 12.8 (*)    RBC 3.07 (*)    Hemoglobin 11.5 (*)    HCT 33.2 (*)    MCV 108.1 (*)    MCH 37.5 (*)    Neutro Abs 11.3 (*)    Lymphs Abs 0.5 (*)    Abs Immature Granulocytes 0.11 (*)    All other components within normal limits  URINALYSIS, ROUTINE W REFLEX MICROSCOPIC - Abnormal; Notable for the following components:   Color, Urine AMBER (*)    Hgb urine dipstick SMALL (*)    Protein, ur 30 (*)    All other components within normal limits  LACTIC ACID, PLASMA - Abnormal; Notable for the following components:   Lactic Acid, Venous 2.8 (*)    All other components within normal limits  MAGNESIUM - Abnormal; Notable for the following components:   Magnesium 1.5 (*)    All other components within normal limits  CK - Abnormal; Notable for the following components:   Total CK 890 (*)    All other components within normal limits  COMPREHENSIVE METABOLIC PANEL - Abnormal; Notable for the following components:   CO2 18 (*)    BUN 7 (*)    Calcium 8.5 (*)    Total Protein 6.4 (*)    Albumin 2.5 (*)    AST 81 (*)    Alkaline Phosphatase 176 (*)    All other components within normal limits  CBG MONITORING, ED - Abnormal; Notable for the following components:   Glucose-Capillary 68 (*)    All other components within normal limits  CBG MONITORING, ED -  Abnormal; Notable for the following components:   Glucose-Capillary 102 (*)    All other components within normal limits  RESP PANEL BY RT-PCR (FLU A&B, COVID) [id number]  ETHANOL  RAPID URINE DRUG SCREEN, HOSP PERFORMED  PHOSPHORUS  LACTIC ACID, PLASMA    EKG EKG Interpretation  Date/Time:  Sunday July 21 2020 09:58:38 EST Ventricular Rate:  129 PR Interval:    QRS Duration: 92 QT Interval:  325 QTC Calculation: 477 R Axis:   80 Text Interpretation: Sinus tachycardia Borderline prolonged QT interval Confirmed by Lajean Saver [phone]) on 07/21/2020 12:08:08 PM    Date: 07/21/2020  Rate: 129  Rhythm: sinus tachycardia   QRS Axis: normal  Intervals: normal, borderline prolonged QT  ST/T Wave abnormalities: normal  Conduction Disutrbances: none  Narrative Interpretation:   Old EKG Reviewed: No significant changes noted EKG reviewed and interpreted by me.      Radiology DG Ribs Unilateral W/Chest Left  Result Date: 07/21/2020 CLINICAL DATA:  Shortness of breath.  Possible fall. EXAM: LEFT RIBS AND CHEST - 3+ VIEW COMPARISON:  Chest x-ray 05/18/2018 FINDINGS: The cardiac silhouette, mediastinal and hilar contours are within normal limits. Moderate eventration of the right hemidiaphragm is noted. No acute pulmonary findings. No pleural effusion or pneumothorax. Dedicated views of the left ribs demonstrate multiple remote rib fractures. I do not see any definite acute fractures. No pleural thickening or pleural effusion. Other rib fractures appear to be remote. IMPRESSION: 1. Numerous remote left rib fractures.  No definite acute fractures. 2. No acute cardiopulmonary findings. Electronically Signed   By: Marijo Sanes M.D.   On: 07/21/2020 11:28   CT Head Wo Contrast  Result Date: 07/21/2020 CLINICAL DATA:  Poly trauma.  Fell. EXAM: CT HEAD WITHOUT CONTRAST TECHNIQUE: Contiguous axial images were obtained from the base of the skull through the vertex without intravenous contrast. COMPARISON:  Head CT 09/01/2017 FINDINGS: Brain: Stable age advanced cerebral atrophy, ventriculomegaly and periventricular white matter disease. No extra-axial fluid collections are identified. No CT findings for acute hemispheric infarction or intracranial hemorrhage. No mass lesions. The brainstem and cerebellum are unremarkable. Stable changes of cerebellar atrophy. Vascular: Vascular calcifications but no aneurysm or hyperdense vessels. Skull: No skull fracture or bone lesions. Sinuses/Orbits: The paranasal sinuses and mastoid air cells are clear. The globes are intact. Other: No scalp lesions or scalp hematoma. IMPRESSION: 1. Stable age  advanced cerebral atrophy, ventriculomegaly and periventricular white matter disease. 2. No acute intracranial findings or skull fracture. Electronically Signed   By: Marijo Sanes M.D.   On: 07/21/2020 12:33    Procedures Procedures (including critical care time)  Medications Ordered in ED Medications  LORazepam (ATIVAN) tablet 1-4 mg (has no administration in time range)    Or  LORazepam (ATIVAN) injection 1-4 mg (has no administration in time range)  thiamine tablet 100 mg (100 mg Oral Given 07/21/20 1136)    Or  thiamine (B-1) injection 100 mg ( Intravenous See Alternative 0000000 [id number])  folic acid (FOLVITE) tablet 1 mg (1 mg Oral Given 07/21/20 1134)  multivitamin with minerals tablet 1 tablet (1 tablet Oral Given 07/21/20 1134)  0.9 %  sodium chloride infusion (1,000 mLs Intravenous New Bag/Given 07/21/20 1133)  sodium chloride 0.9 % bolus 1,000 mL (1,000 mLs Intravenous New Bag/Given 07/21/20 1133)    ED Course  I have reviewed the triage vital signs and the nursing notes.  Pertinent labs & imaging results that were available during my care of the patient were reviewed by  me and considered in my medical decision making (see chart for details).    MDM Rules/Calculators/A&P                          BP 109/77   Pulse (!) 106   Temp 99.4 F (37.4 C) (Rectal)   Resp (!) 21   Wt 74.8 kg   SpO2 99%   BMI 25.09 kg/m   Final Clinical Impression(s) / ED Diagnoses Final diagnoses:  Alcohol withdrawal delirium ([place])  Dehydration  Hypoglycemia    Rx / DC Orders ED Discharge Orders    None     10:31 AM Patient [place] and was brought here for evaluation.  He does have significant history of alcohol abuse drinking approximately 12 pack a day.  He was found to be tachycardic with heart rate of 130.  He is frail appearing.  He is however mentating appropriately and denies falling onto the ground but states that he was resting  on the ground because it was comfortable.  Does not have any signs of head or cervical spine trauma.  No significant signs of injury.  Did report having some discomfort to his left ribs from a fall previously, will obtain x-ray.  Did endorse some congestion, Covid test ordered.  Tachycardia could be due to alcohol withdrawal, will initiate CIWA protocol.  Patient is not in DTs.  Since he was down the ground, will check CK level as well.  IV fluid given  2:29 PM CIWA protocol in place.  Patient does remains tachycardic which I suspect is related to alcohol withdrawal.  Patient also has a CBG of 68, he was given juice and crackers and CBG did improve.  He is currently receiving IV fluid.  He is mentating appropriately.  Due to high risk of alcohol withdrawal hospitalist elevated lactic acid plan to consult for admission.  Doubt infectious etiology as his chest x-ray and UA showed no evidence of infection.  4:18 PM Patient is amenable for help with alcohol detox.  He wants to quit drinking.  I have paged family medicine resident to have patient admitted but have not receive a response yet.    I have consulted Family medicine resident who agrees to see and will admit pt for alcohol detox, and alcohol withdrawal.  Anthony [name] was evaluated in Emergency Department on 07/21/2020 for the symptoms described in the history of present illness. He was evaluated in the context of the global COVID-19 pandemic, which necessitated consideration that the patient might be at risk for infection with the SARS-CoV-2 virus that causes COVID-19. Institutional protocols and algorithms that pertain to the evaluation of patients at risk for COVID-19 are in a state of rapid change based on information released by regulatory bodies including the CDC and federal and state organizations. These policies and algorithms were followed during the patient's care in the ED.    Domenic Moras, PA-C 07/21/20 1622    Quintella Reichert,  MD 07/21/20 [phone]

## 2020-07-21 NOTE — TOC Progression Note (Addendum)
Transition of Care [facility]) - Progression Note    Patient Details  Name: Anthony [name] MRN: [id number] Date of Birth: Dec 06, 1955  Transition of Care [facility]) CM/SW [place], LCSW Phone Number: 07/21/2020, 11:29 AM  Clinical Narrative: CSW acknowledges consult for SA. CSW notes patient currently under contact precautions and awaiting COVID results. CSW provided resources in chart and will follow-up with patient pending results of test.      1315: CSW noted test came back negative and met with patient to discuss alcohol. Patient was notably guarded and reported he had only been drinking 1-2 shots a day until he started hanging out with a different group. Patient reports he use to be working twenty hours a day and could not drink but says he has got back into a party-type atmosphere. Patient reported he was not considering quitting alcohol. Patient reported he had some depression but denied any SI. Patient did make a concerning statement that he will not be here much longer as he has lived a good life. CSW will notify provider. Patient accepted SA resources but declined a referral at this time.        Expected Discharge Plan and Services                                                 Social Determinants of Health (SDOH) Interventions    Readmission Risk Interventions No flowsheet data found.

## 2020-07-21 NOTE — ED Notes (Signed)
Pt states he has been sob also.

## 2020-07-21 NOTE — H&P (Addendum)
[facility] Admission History and Physical Service Pager: [phone]  Patient name: Anthony [name] Medical record number: [id number] Date of birth: 05/29/1956 Age: 65 y.o. Gender: male  Primary Care Provider: Carollee Leitz, MD Consultants: None Code Status: Full code  Preferred Emergency Contact: Anthony [name] [phone]  Chief Complaint: Alcohol withdrawal  Assessment and Plan: Anthony [name] is a 65 y.o. male presenting with alcohol withdrawal . PMH is significant for alcohol use disorder with prior withdrawal and DTs, HTN, chronic low back pain, anxiety, depression, and right pulmonary embolus.  Alcohol withdrawal  Alcohol Use Disorder  Elevated AST Patient was reportedly on his way to the store to buy more alcohol today when he was found down at the bus stop. In the ED patient is tremulous and tachycardic to the 120s. States he would like help with his alcohol use disorder. Typically drinks 12 beers per day. Last drink this morning ~10am. Of note, he does have a prior hx of withdrawal and DTs, but no seizures. Lab work remarkable for lactic acid 2.8, initial glucose 68, Mag 1.5, AST 81, alk phos 176, Hgb 11.5 with MCV 108.1, and CK 890. UDS negative, ethanol level <10. CT head unremarkable.  -Admit to FPTS, progressive, attending Dr. McDiarmid -OOB with assistance only -Heart healthy diet -CIWA monitoring with Ativan -Thiamine/Folate daily -Daily CMP, mag  Found down: Patient is a poor historian and states he had leg weakness which led to him lying on the ground. Per the ED documentation the patient was brought by EMS after being found outside laying face down on the grass. The patient told the ED provider he was walking to get groceries but side to lay down on the grass because it was more comfortable and did not realize how long he laid there.. The fact that he was found down does raise the concern for seizure as an etiology. Patient did have a CT head on  admission which did not show acute intracranial findings. Physical exam significant for no weakness in the lower limbs and normal neurologic exam. Do not think further neurologic work-up is needed at this time as the patient has a normal neurologic exam. There is a possibility that the patient had a fall, started having alcohol withdrawals or had a seizure which led to him being found down, or had some other trauma. The fact that the patient had some elevated CK (890 on admission) a can be suggestive of trauma versus being found down versus seizure. -CIWA protocol as above -If patient has a seizure will give 2 mg Ativan -Continue to monitor patient's mental status  Elevated CK  Hematuria Patient's CK found to be elevated at 890. UA demonstrated small hemoglobin. Differential includes rhabdo given patient was down for unclear duration. However, degree of CK elevation not consistent with rhabdo, and may just be related to dehydration. -s/p NS bolus x1 in the ED -NS @ 14m/hr for 12 hrs -Repeat CK tomorrow morning -Consider repeat UA at some point to ensure resolution of Hgb -Outpatient work-up for hematuria if not worked up during hospitalization  Lactic Acidosis- resolved Initial lactic acid 2.8, which subsequently improved to 1.1 after IV fluids in the ED.  Hypertension Patient with history of HTN, on HCTZ 228mand amlodipine 22m49maily at home. Reports he took his medications today. Has remained normotensive since arrival with most recent BP 109/77. -Monitor BP -Continue home meds  Macrocytic Anemia Hgb 11.5 on admission with MCV 108. Likely related to chronic  alcohol use and possible component of iron deficiency as well given poor nutritional status in the setting of alcoholism. On ferrous sulfate twice weekly at home. -Continue home ferrous sulfate -Daily CBC  HFpEF grade 1 diastolic dysfunction: Patient with an echo on 09/03/2017 showing ejection fraction 60-65% with left ventricular  relaxation abnormal consistent with grade 1 diastolic dysfunction. Patient not currently on treatment or medications. -Continue to monitor  Anxiety  Depression Chronic, stable. Home meds include zoloft 266m daily -Continue home Zoloft  Seasonal allergies Chronic, stable. Home meds include zyrtec 171mdaily -Loratadine 1064maily  Low back pain Chronic, stable. Home meds include robaxin 750m22mh prn and Tylenol prn -Will hold home Robaxin for now -Lidocaine patch as needed  Borderline Prolonged QTc Corrected QTc 464 on admission EKG.  -Avoid QT prolonging agents  Subacute Left Rib Fractures Patient reports hx of rib fractures after fall several months ago. Rib x-rays obtained on admission confirmed multiple remote L rib fractures without acute fracture. -Continue to monitor  FEN/GI: Heart healthy diet Prophylaxis: Lovenox  Disposition: Progressive  History of Present Illness:  Anthony [name] is a 64 y6. male presenting with alcohol withdrawal.  Patient was reportedly found lying down in the grass at the bus stop today. Stated to ED provider that he just wanted to rest. Reports to me he was unable to stand up because of weakness in bilateral lower legs once he laid down.  Patient states he was on his way to the store to buy more beer at the time. He drinks ~12 beers daily. Had 1 beer this morning around 10am per patient. In regard to his alcohol use, patient states he has never had a withdrawal seizure and never been hospitalized for withdrawal.  No tobacco, no recent illicit drugs (smoked pot about 2 years ago).  In the ED he endorses some back pain which he states is chronic and unchanged. Also reports some vision changes which are chronic (s/p laser surgery). Denies headache, SOB, chest pain, abdominal pain, N/V/D. He does endorse some auditory hallucinations which he states he typically has every morning (will hear things coming from the TV remote).  Review Of Systems: Per  HPI with the following additions:   Review of Systems  Constitutional: Negative for fatigue.  HENT: Negative for congestion.   Respiratory: Negative for shortness of breath.   Cardiovascular: Negative for chest pain.  Gastrointestinal: Negative for abdominal pain, diarrhea and vomiting.  Musculoskeletal: Positive for back pain.  Skin: Negative for rash.  Neurological: Negative for seizures and speech difficulty.  Psychiatric/Behavioral: Positive for hallucinations.     Patient Active Problem List   Diagnosis Date Noted  . Alcohol abuse with alcohol-induced mood disorder (HCC)[place]/30/2019  . Depression 05/26/2018  . Tooth infection 04/25/2018  . Anxiety state 04/25/2018  . Need for immunization against influenza 04/25/2018  . Lightheadedness 11/28/2017  . Pancytopenia (HCC)[place]. Alcohol withdrawal (HCC)[place]/13/2019  . Delirium tremens (HCC)[place]/13/2019  . Altered mental status   . Dyspnea 11/27/2016  . Actinic keratosis 11/27/2016  . Macrocytic anemia 11/23/2016  . Dry eye 11/23/2016  . Right pulmonary embolus (HCC)[place]/16/2018  . Hiatal hernia 09/14/2016  . Skin cancer 08/13/2016  . Toe pain 07/07/2013  . Poor social situation 06/09/2013  . Tinea versicolor 06/08/2013  . Trigger finger, acquired 11/18/2012  . 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 HTN (hypertension) 04/11/2012  . Alcohol abuse 09/16/2006  .  Chronic low back pain 09/16/2006    Past Medical History: Past Medical History:  Diagnosis Date  . Alcoholism ([place])   . Depression   . Hydrocele    Surgically corrected  . Hypertension   . Lumbar compression fracture ([place])   . Skin cancer    exciced 2017    Past Surgical History: Past Surgical History:  Procedure Laterality Date  . CATARACT EXTRACTION    . HIATAL HERNIA REPAIR  2016  . HYDROCELE EXCISION / REPAIR  2010  . SKIN CANCER EXCISION Left    Excised 2017    Social History: Social  History   Tobacco Use  . Smoking status: Never Smoker  . Smokeless tobacco: Current User    Types: Snuff  Vaping Use  . Vaping Use: Never used  Substance Use Topics  . Alcohol use: Yes    Alcohol/week: 43.0 standard drinks    Types: 42 Cans of beer, 1 Standard drinks or equivalent per week    Comment: daily use-6 beers daily and whiskey when he has it.  . Drug use: Yes    Types: Marijuana    Comment: occasional-none this year per pt     Family History: Family History  Problem Relation Age of Onset  . Heart attack Father        died at 29 years  . Dementia Father   . Stroke Father   . Cancer Sister   . Colon cancer Neg Hx   . Colon polyps Neg Hx   . Esophageal cancer Neg Hx   . Stomach cancer Neg Hx   . Rectal cancer Neg Hx     Allergies and Medications: No Known Allergies No current facility-administered medications on file prior to encounter.   Current Outpatient Medications on File Prior to Encounter  Medication Sig Dispense Refill  . acetaminophen (TYLENOL) 650 MG CR tablet Take 1 tablet (650 mg total) by mouth every 8 (eight) hours as needed for pain. (Patient taking differently: Take 1,300 mg by mouth 2 (two) times daily as needed for pain. ) 90 tablet 0  . amLODipine (NORVASC) 5 MG tablet Take 1 tablet (5 mg total) by mouth daily. 30 tablet 0  . b complex vitamins capsule Take 1 capsule by mouth daily.    . cetirizine (ZYRTEC) 10 MG tablet Take 1 tablet (10 mg total) by mouth daily. 30 tablet 11  . ferrous sulfate 325 (65 FE) MG tablet Take 1 tablet (325 mg total) by mouth 2 (two) times a week. 30 tablet 0  . hydrochlorothiazide (HYDRODIURIL) 25 MG tablet Take 1 tablet (25 mg total) by mouth daily. 90 tablet 3  . hydroxypropyl methylcellulose / hypromellose (ISOPTO TEARS / GONIOVISC) 2.5 % ophthalmic solution Place 1 drop into both eyes as needed for dry eyes. 15 mL 12  . methocarbamol (ROBAXIN) 750 MG tablet Take 1 tablet (750 mg total) by mouth every 8 (eight)  hours as needed. 60 tablet 3  . Multiple Vitamin (MULTIVITAMIN WITH MINERALS) TABS tablet Take 1 tablet by mouth 2 (two) times a week. 30 tablet 0  . Na Sulfate-K Sulfate-Mg Sulf 17.5-3.13-1.6 GM/177ML SOLN Suprep (no substitutions)-TAKE AS DIRECTED. 354 mL 0  . sertraline (ZOLOFT) 100 MG tablet Take 2 tablets (200 mg total) by mouth daily. 60 tablet 11    Objective: BP 109/77   Pulse (!) 106   Temp 98.9 F (37.2 C) (Oral)   Resp (!) 21   Wt 74.8 kg   SpO2 99%  BMI 25.09 kg/m  Exam: General: alert, disheveled, no acute distress Eyes: EOMI ENTM: dry mucous membranes Neck: supple Cardiovascular: tachycardic, normal S1/S2 without m/r/g Respiratory: normal WOB on room air, lungs CTAB Gastrointestinal: soft, nontender, nondistended MSK: no peripheral edema Derm: few scattered abrasions on extremities, otherwise intact, no rashes Neuro: A&O x 4. CNII-XII intact. Fine touch sensation in tact in upper and lower extremities bilaterally, strength 5/5 in upper and lower extremities bilaterally.  Psych: disheveled, appropriate affect, normal speech, does not appear to be responding to internal stimuli  Labs and Imaging: CBC BMET  Recent Labs  Lab 07/21/20 1029  WBC 12.8*  HGB 11.5*  HCT 33.2*  PLT 276   Recent Labs  Lab 07/21/20 1141  NA 137  K 4.2  CL 106  CO2 18*  BUN 7*  CREATININE 1.05  GLUCOSE 81  CALCIUM 8.5*     EKG: sinus tachycardia at 126, corrected QTc 464 (borderline prolonged)  DG Ribs Unilateral W/Chest Left Result Date: 07/21/2020 IMPRESSION: 1. Numerous remote left rib fractures.  No definite acute fractures. 2. No acute cardiopulmonary findings. Electronically Signed   By: Marijo Sanes M.D.   On: 07/21/2020 11:28   CT Head Wo Contrast Result Date: 07/21/2020 IMPRESSION: 1. Stable age advanced cerebral atrophy, ventriculomegaly and periventricular white matter disease. 2. No acute intracranial findings or skull fracture. Electronically Signed   By: Marijo Sanes M.D.   On: 07/21/2020 12:33    [name], MD 07/21/2020, 5:39 PM PGY-1, [place] Intern pager: [phone], text pages welcome  Upper Level Addendum:  I have seen and evaluated this patient along with Dr. Rock Nephew and reviewed the above note, making necessary revisions in green.   Lurline Del, DO 07/21/2020, 6:41 PM PGY-2, [place]

## 2020-07-22 ENCOUNTER — Encounter (HOSPITAL_COMMUNITY): Payer: Self-pay | Admitting: Family Medicine

## 2020-07-22 DIAGNOSIS — F10231 Alcohol dependence with withdrawal delirium: Secondary | ICD-10-CM | POA: Diagnosis not present

## 2020-07-22 DIAGNOSIS — F1020 Alcohol dependence, uncomplicated: Secondary | ICD-10-CM | POA: Diagnosis not present

## 2020-07-22 DIAGNOSIS — R9431 Abnormal electrocardiogram [ECG] [EKG]: Secondary | ICD-10-CM | POA: Diagnosis not present

## 2020-07-22 DIAGNOSIS — E872 Acidosis: Secondary | ICD-10-CM | POA: Diagnosis present

## 2020-07-22 DIAGNOSIS — Z659 Problem related to unspecified psychosocial circumstances: Secondary | ICD-10-CM

## 2020-07-22 DIAGNOSIS — Z87898 Personal history of other specified conditions: Secondary | ICD-10-CM

## 2020-07-22 DIAGNOSIS — F1024 Alcohol dependence with alcohol-induced mood disorder: Secondary | ICD-10-CM | POA: Diagnosis present

## 2020-07-22 DIAGNOSIS — I480 Paroxysmal atrial fibrillation: Secondary | ICD-10-CM | POA: Diagnosis present

## 2020-07-22 DIAGNOSIS — R710 Precipitous drop in hematocrit: Secondary | ICD-10-CM

## 2020-07-22 DIAGNOSIS — M6282 Rhabdomyolysis: Secondary | ICD-10-CM | POA: Diagnosis not present

## 2020-07-22 DIAGNOSIS — J302 Other seasonal allergic rhinitis: Secondary | ICD-10-CM | POA: Diagnosis present

## 2020-07-22 DIAGNOSIS — R319 Hematuria, unspecified: Secondary | ICD-10-CM | POA: Diagnosis present

## 2020-07-22 DIAGNOSIS — F17200 Nicotine dependence, unspecified, uncomplicated: Secondary | ICD-10-CM

## 2020-07-22 DIAGNOSIS — I5032 Chronic diastolic (congestive) heart failure: Secondary | ICD-10-CM | POA: Diagnosis present

## 2020-07-22 DIAGNOSIS — F10232 Alcohol dependence with withdrawal with perceptual disturbance: Secondary | ICD-10-CM | POA: Diagnosis not present

## 2020-07-22 DIAGNOSIS — E8809 Other disorders of plasma-protein metabolism, not elsewhere classified: Secondary | ICD-10-CM

## 2020-07-22 DIAGNOSIS — Y900 Blood alcohol level of less than 20 mg/100 ml: Secondary | ICD-10-CM | POA: Diagnosis present

## 2020-07-22 DIAGNOSIS — S2242XA Multiple fractures of ribs, left side, initial encounter for closed fracture: Secondary | ICD-10-CM | POA: Diagnosis not present

## 2020-07-22 DIAGNOSIS — R0602 Shortness of breath: Secondary | ICD-10-CM | POA: Diagnosis not present

## 2020-07-22 DIAGNOSIS — F10931 Alcohol use, unspecified with withdrawal delirium: Secondary | ICD-10-CM | POA: Insufficient documentation

## 2020-07-22 DIAGNOSIS — E860 Dehydration: Secondary | ICD-10-CM | POA: Diagnosis present

## 2020-07-22 DIAGNOSIS — F418 Other specified anxiety disorders: Secondary | ICD-10-CM | POA: Diagnosis present

## 2020-07-22 DIAGNOSIS — Z20822 Contact with and (suspected) exposure to covid-19: Secondary | ICD-10-CM | POA: Diagnosis present

## 2020-07-22 DIAGNOSIS — D539 Nutritional anemia, unspecified: Secondary | ICD-10-CM | POA: Diagnosis present

## 2020-07-22 DIAGNOSIS — R440 Auditory hallucinations: Secondary | ICD-10-CM | POA: Diagnosis present

## 2020-07-22 DIAGNOSIS — I4892 Unspecified atrial flutter: Secondary | ICD-10-CM | POA: Diagnosis present

## 2020-07-22 DIAGNOSIS — Z79899 Other long term (current) drug therapy: Secondary | ICD-10-CM | POA: Diagnosis not present

## 2020-07-22 DIAGNOSIS — E46 Unspecified protein-calorie malnutrition: Secondary | ICD-10-CM

## 2020-07-22 DIAGNOSIS — Z23 Encounter for immunization: Secondary | ICD-10-CM | POA: Diagnosis not present

## 2020-07-22 DIAGNOSIS — F411 Generalized anxiety disorder: Secondary | ICD-10-CM | POA: Diagnosis not present

## 2020-07-22 DIAGNOSIS — I110 Hypertensive heart disease with heart failure: Secondary | ICD-10-CM | POA: Diagnosis present

## 2020-07-22 DIAGNOSIS — R748 Abnormal levels of other serum enzymes: Secondary | ICD-10-CM | POA: Diagnosis present

## 2020-07-22 DIAGNOSIS — R9082 White matter disease, unspecified: Secondary | ICD-10-CM | POA: Diagnosis not present

## 2020-07-22 DIAGNOSIS — Z86711 Personal history of pulmonary embolism: Secondary | ICD-10-CM | POA: Diagnosis not present

## 2020-07-22 DIAGNOSIS — G9389 Other specified disorders of brain: Secondary | ICD-10-CM

## 2020-07-22 DIAGNOSIS — S0990XA Unspecified injury of head, initial encounter: Secondary | ICD-10-CM | POA: Diagnosis not present

## 2020-07-22 DIAGNOSIS — W19XXXA Unspecified fall, initial encounter: Secondary | ICD-10-CM | POA: Diagnosis present

## 2020-07-22 DIAGNOSIS — G319 Degenerative disease of nervous system, unspecified: Secondary | ICD-10-CM | POA: Diagnosis not present

## 2020-07-22 DIAGNOSIS — K922 Gastrointestinal hemorrhage, unspecified: Secondary | ICD-10-CM | POA: Diagnosis not present

## 2020-07-22 DIAGNOSIS — G8929 Other chronic pain: Secondary | ICD-10-CM | POA: Diagnosis present

## 2020-07-22 HISTORY — DX: Other disorders of plasma-protein metabolism, not elsewhere classified: E88.09

## 2020-07-22 HISTORY — DX: Unspecified protein-calorie malnutrition: E46

## 2020-07-22 HISTORY — DX: Personal history of other specified conditions: Z87.898

## 2020-07-22 HISTORY — DX: Nicotine dependence, unspecified, uncomplicated: F17.200

## 2020-07-22 HISTORY — DX: Rhabdomyolysis: M62.82

## 2020-07-22 LAB — CBC
HCT: 29.8 % — ABNORMAL LOW (ref 39.0–52.0)
Hemoglobin: 9.7 g/dL — ABNORMAL LOW (ref 13.0–17.0)
MCH: 36.1 pg — ABNORMAL HIGH (ref 26.0–34.0)
MCHC: 32.6 g/dL (ref 30.0–36.0)
MCV: 110.8 fL — ABNORMAL HIGH (ref 80.0–100.0)
Platelets: 209 10*3/uL (ref 150–400)
RBC: 2.69 MIL/uL — ABNORMAL LOW (ref 4.22–5.81)
RDW: 12.6 % (ref 11.5–15.5)
WBC: 7.4 10*3/uL (ref 4.0–10.5)
nRBC: 0 % (ref 0.0–0.2)

## 2020-07-22 LAB — COMPREHENSIVE METABOLIC PANEL
ALT: 39 U/L (ref 0–44)
AST: 134 U/L — ABNORMAL HIGH (ref 15–41)
Albumin: 1.9 g/dL — ABNORMAL LOW (ref 3.5–5.0)
Alkaline Phosphatase: 149 U/L — ABNORMAL HIGH (ref 38–126)
Anion gap: 12 (ref 5–15)
BUN: 12 mg/dL (ref 8–23)
CO2: 17 mmol/L — ABNORMAL LOW (ref 22–32)
Calcium: 7.9 mg/dL — ABNORMAL LOW (ref 8.9–10.3)
Chloride: 110 mmol/L (ref 98–111)
Creatinine, Ser: 0.84 mg/dL (ref 0.61–1.24)
GFR, Estimated: >60 mL/min (ref >60)
Glucose, Bld: 90 mg/dL (ref 70–99)
Potassium: 3.7 mmol/L (ref 3.5–5.1)
Sodium: 139 mmol/L (ref 135–145)
Total Bilirubin: 0.6 mg/dL (ref 0.3–1.2)
Total Protein: 5 g/dL — ABNORMAL LOW (ref 6.5–8.1)

## 2020-07-22 LAB — HIV ANTIBODY (ROUTINE TESTING W REFLEX): HIV Screen 4th Generation wRfx: NONREACTIVE

## 2020-07-22 LAB — CK
Total CK: 1236 U/L — ABNORMAL HIGH (ref 49–397)
Total CK: 829 U/L — ABNORMAL HIGH (ref 49–397)

## 2020-07-22 MED ORDER — CHLORDIAZEPOXIDE HCL 25 MG PO CAPS
25.0000 mg | ORAL_CAPSULE | Freq: Every day | ORAL | Status: AC
  Administered 2020-07-25: 25 mg via ORAL
  Filled 2020-07-22: qty 1

## 2020-07-22 MED ORDER — THIAMINE HCL 100 MG PO TABS
100.0000 mg | ORAL_TABLET | Freq: Every day | ORAL | Status: DC
  Administered 2020-07-23 – 2020-08-09 (×18): 100 mg via ORAL
  Filled 2020-07-22 (×19): qty 1

## 2020-07-22 MED ORDER — CHLORDIAZEPOXIDE HCL 25 MG PO CAPS
25.0000 mg | ORAL_CAPSULE | Freq: Four times a day (QID) | ORAL | Status: AC
  Administered 2020-07-22 – 2020-07-23 (×4): 25 mg via ORAL
  Filled 2020-07-22 (×3): qty 1

## 2020-07-22 MED ORDER — LIDOCAINE 5 % EX PTCH
1.0000 | MEDICATED_PATCH | Freq: Every day | CUTANEOUS | Status: DC | PRN
  Filled 2020-07-22: qty 1

## 2020-07-22 MED ORDER — MAGNESIUM SULFATE 2 GM/50ML IV SOLN
2.0000 g | Freq: Every day | INTRAVENOUS | Status: AC
  Administered 2020-07-22 – 2020-07-26 (×5): 2 g via INTRAVENOUS
  Filled 2020-07-22 (×5): qty 50

## 2020-07-22 MED ORDER — CHLORDIAZEPOXIDE HCL 25 MG PO CAPS: 25.0000 mg | ORAL_CAPSULE | Freq: Four times a day (QID) | ORAL | Status: AC | PRN

## 2020-07-22 MED ORDER — ONDANSETRON 4 MG PO TBDP
4.0000 mg | ORAL_TABLET | Freq: Four times a day (QID) | ORAL | Status: AC | PRN
  Filled 2020-07-22: qty 1

## 2020-07-22 MED ORDER — HYDROXYZINE HCL 25 MG PO TABS: 25.0000 mg | ORAL_TABLET | Freq: Four times a day (QID) | ORAL | Status: AC | PRN

## 2020-07-22 MED ORDER — CHLORDIAZEPOXIDE HCL 25 MG PO CAPS
25.0000 mg | ORAL_CAPSULE | Freq: Three times a day (TID) | ORAL | Status: AC
  Administered 2020-07-23 – 2020-07-24 (×3): 25 mg via ORAL
  Filled 2020-07-22 (×4): qty 1

## 2020-07-22 MED ORDER — THIAMINE HCL 100 MG/ML IJ SOLN: 100.0000 mg | Freq: Once | INTRAMUSCULAR | Status: DC

## 2020-07-22 MED ORDER — CHLORDIAZEPOXIDE HCL 25 MG PO CAPS
50.0000 mg | ORAL_CAPSULE | Freq: Once | ORAL | Status: AC
  Administered 2020-07-22: 50 mg via ORAL
  Filled 2020-07-22: qty 2

## 2020-07-22 MED ORDER — CHLORDIAZEPOXIDE HCL 25 MG PO CAPS
25.0000 mg | ORAL_CAPSULE | ORAL | Status: AC
  Administered 2020-07-24 – 2020-07-25 (×2): 25 mg via ORAL
  Filled 2020-07-22 (×2): qty 1

## 2020-07-22 MED ORDER — LOPERAMIDE HCL 2 MG PO CAPS: 2.0000 mg | ORAL_CAPSULE | ORAL | Status: AC | PRN

## 2020-07-22 MED ORDER — SODIUM CHLORIDE 0.9 % IV SOLN: INTRAVENOUS | Status: DC

## 2020-07-22 NOTE — ED Notes (Signed)
Pt has not ate any of his breakfast or lunch

## 2020-07-22 NOTE — ED Notes (Signed)
Cleaned pt and applied clean bedding and gown

## 2020-07-22 NOTE — ED Notes (Signed)
RN notified about vitals 

## 2020-07-22 NOTE — ED Notes (Signed)
Pt cleaned of urinary incontinence. Linens changed.  

## 2020-07-22 NOTE — Progress Notes (Signed)
FPTS Interim Progress Note  S: Sleeping soundly.   O: BP 130/90 (Recycled on monitor in the room)   Pulse 94   Temp 98.1 F (36.7 C) (Oral)   Resp 16   Wt 74.8 kg   SpO2 91%   BMI 25.09 kg/m   General: No distress. Age appropriate. Respiratory: normal effort  A/P: PM check  Patient sleeping soundly. VSS. Last CIWA score 1. Last ativan injection 2 mg at 0624. Librium 25 mg at 2055.   Lavonda Jumbo, DO 07/22/2020, 10:03 PM PGY-2, [facility] Family Medicine Service pager [phone]

## 2020-07-22 NOTE — ED Notes (Signed)
Pt is NSR on monitor 

## 2020-07-22 NOTE — ED Notes (Signed)
Lunch Tray Ordered @ 1045 

## 2020-07-22 NOTE — ED Notes (Signed)
Pt becoming more and move agitated and delusional.  Is unable to hold a conversation, not oriented at all and is thrashing around in the bed.  He might also be experiencing visual hallucinations as he asked RN to "get that man" out of the room.  2mg  of Ativan give per Mar.  Provider paged by unit secretary.

## 2020-07-22 NOTE — ED Notes (Signed)
Pt appears to be resting quietly, no signs of distress at this time, will continue to monitor.

## 2020-07-22 NOTE — ED Notes (Addendum)
Provider returned call, reassess in 10 minutes and if no change give 2mg  of Ativan again.  Also ordered to restart fluids at 150, which was done.  Will continue to monitor.

## 2020-07-22 NOTE — Progress Notes (Addendum)
Family Medicine Teaching Service Daily Progress Note Intern Pager: [phone]  Patient name: Anthony [name] Medical record number: [id number] Date of birth: 1955-09-20 Age: 65 y.o. Gender: male  Primary Care Provider: Carollee Leitz, MD Consultants: None Code Status: Full Code  Pt Overview and Major Events to Date:  Pt with history of alcohol use disorder with complicated WDLs/DTs.   Assessment and Plan: [name] is a 65 y.o. male presenting with found down for unknown time and unconscious and is now being treated for acute alcohol withdrawal . PMH is significant for alcohol use disorder with prior withdrawal and DTs, HFpEF (last Echo 2019), HTN, chronic low back pain, anxiety, depression, and h/o right pulmonary embolus.  Alcohol withdrawal  Alcohol Use Disorder  Elevated AST Patient has been increasingly agitated and disoriented in the ED overnight. CIWAs 11 and 13 at 4a and 6a, respectively, treated with Ativan 2mg  x2 per protocol. responded well to the ativan and was then sedated on our exam. Magnesium 1.5 yesterday. On exam, patient is sleeping heavily and responds sluggishly to verbal and tactile stimulation.  - CIWA monitoring with Ativan PRN  - if ativan is required frequently and/or high amounts, will initiate librium and consider cc CCM. -Thiamine/Folate daily - 2g IV Mag daily for 5 days -Daily CMP, mag, phos -OOB with assistance only -Heart healthy diet - PT/OT starting tom  Elevated CK  Hemoglobinuria: Patient's CK found to be elevated to 890>1236. UA with small hemoglobin. Unclear whether this CK is 2/2 dehydration vs. Rhabdo.  Rhabdo is less likely with a CK <1500 but remains on the differential given that patient was found face down in the grass with no known mechanism of fall/injury/possible seizure activity. Difficult to assess for muscle tenderness given sedation.  -s/p NS bolus x1 in the ED + 1276mL repletion - Increasing IVF to NS 115mL/hr -Repeat CK this  afternoon -Consider repeat UA at some point to ensure resolution of Hgb  Macrocytic Anemia: Hgb 11.5>9.7 with MCV 110.  -Continue home ferrous sulfate - CBC tomorrow am  Hypertension- chronic. Stable.  Has remained normotensive since arrival with most recent BP 131/88. -Monitor BP -Continue home meds  HFpEF grade 1 diastolic dysfunction: No LE edema or abdominal fluid on exam. Appears euvolemic. -Continue to monitor, especially with aggressive hydration  Anxiety  Depression: Chronic, stable. -Continue home Zoloft  Seasonal allergies: Chronic, stable.  -continue home Loratadine 10mg  daily  Low back pain: Chronic, stable. Home meds include robaxin 750mg  q8h prn and Tylenol prn -continue to hold home Robaxin -Lidocaine patch as needed  Borderline Prolonged QTc: Corrected QTc 464 on admission EKG.  -Avoid QT prolonging agents  Remote Left Rib Fractures: -Continue to monitor - pain control PRN as listed above - tylenol PRN - incentive spirometer   FEN/GI: Heart healthy diet Prophylaxis: Lovenox  Status is: progressive  The patient will require care spanning > 2 midnights and should be moved to inpatient because: Inpatient level of care appropriate due to severity of illness  Dispo: The patient is from: Home              Anticipated d/c is to: Home              Anticipated d/c date is: 3 days              Patient currently is not medically stable to d/c.  Subjective:  Patient is heavily sedated on Ativan. Responds to verbal and tactile stimuli, but responses are sluggish and  incomprehensible.   Objective: Temp:  [97.5 F (36.4 C)-99.4 F (37.4 C)] 97.5 F (36.4 C) (01/03 0735) Pulse Rate:  [89-114] 107 (01/03 1200) Resp:  [16-28] 21 (01/03 1200) BP: (102-150)/(57-114) 137/114 (01/03 1200) SpO2:  [93 %-100 %] 98 % (01/03 1200)  Physical Exam: General: Patient heavily sedated  Cardiovascular: S1/S2, no murmurs, rubs, or gallops Respiratory: Lungs  CTAB anteriorly Abdomen: Non-tender, non-distended, without ascites or hepatomegaly. Bowel sounds normal.  Extremities: No LE edema. Bilateral UE tremor.   Laboratory: Recent Labs  Lab 07/21/20 1029 07/22/20 0414  WBC 12.8* 7.4  HGB 11.5* 9.7*  HCT 33.2* 29.8*  PLT 276 209   Recent Labs  Lab 07/21/20 1141 07/22/20 0414  NA 137 139  K 4.2 3.7  CL 106 110  CO2 18* 17*  BUN 7* 12  CREATININE 1.05 0.84  CALCIUM 8.5* 7.9*  PROT 6.4* 5.0*  BILITOT 1.2 0.6  ALKPHOS 176* 149*  ALT 33 39  AST 81* 134*  GLUCOSE 81 90    Imaging/Diagnostic Tests: No new imaging.   Dorothyann Gibbs, Medical Student 07/22/2020, 12:32 PM     RESIDENT ATTESTATION    I have seen and examined this patient.     I have discussed the findings and exam with the student doctor and agree with the above note, which I have edited appropriately. I helped develop the management plan that is described in the student doctor's note, and I agree with the content.   Jamelle Rushing, DO PGY-3 Family Medicine Resident

## 2020-07-22 NOTE — Hospital Course (Addendum)
Anthony [name] is a 65 y.o. male with a history of AUD with history of DTs, HFpEF, HTN, chronic back pain, anxiety, depression, right PE who presented with acute alcohol withdrawal. Hospital course outlined by problem below:  Alcohol withdrawal Patient initially found unresponsive due to alcohol withdrawal. He was treated with chlordiazepoxide taper and lorazepam per CIWA protocol. Alcohol withdrawal resolved prior to discharge and CIWA monitoring was discontinued.  pAF He was noted on EKG to have a new onset paroxysmal episode of A Fib. Patient remained asymptomatic. Given his history of unprovoked PE, he was started on rivaroxaban for anticoagulation. Echo was obtained and was essentially unchanged from prior study 2 years ago. Metoprolol succinate was added for rate control.  HTN He was initially continued on his home medications, but his amlodipine and HCTZ were discontinued due to soft BP after metoprolol was added. His BP remained normotensive to soft throughout admission.  Hypomagnesemia Magnesium was persistently low throughout admission so he was started on magnesium oxide. Mg 1.5 prior to discharge. Patient refused IV magnesium on day of discharge.  Seborrheic dermatitis Noted on face, chronic for years. Resolved with topical ketoconazole.  Vaccination He received his COVID Booster vaccine while in hospital.   Issues for Follow Up:  New onset A Fib started on rivaroxaban and metoprolol succinate. Housing insecurity, may benefit from CCM referral. Needs magnesium checked in 2-3 days, patient refused replacement on date of discharge.

## 2020-07-22 NOTE — ED Notes (Signed)
Report given to floor, [place], Charity fundraiser.

## 2020-07-23 DIAGNOSIS — Z87898 Personal history of other specified conditions: Secondary | ICD-10-CM | POA: Diagnosis not present

## 2020-07-23 DIAGNOSIS — E860 Dehydration: Secondary | ICD-10-CM | POA: Diagnosis not present

## 2020-07-23 DIAGNOSIS — F1020 Alcohol dependence, uncomplicated: Secondary | ICD-10-CM | POA: Diagnosis not present

## 2020-07-23 LAB — COMPREHENSIVE METABOLIC PANEL
ALT: 41 U/L (ref 0–44)
AST: 124 U/L — ABNORMAL HIGH (ref 15–41)
Albumin: 1.9 g/dL — ABNORMAL LOW (ref 3.5–5.0)
Alkaline Phosphatase: 139 U/L — ABNORMAL HIGH (ref 38–126)
Anion gap: 10 (ref 5–15)
BUN: 6 mg/dL — ABNORMAL LOW (ref 8–23)
CO2: 19 mmol/L — ABNORMAL LOW (ref 22–32)
Calcium: 7.8 mg/dL — ABNORMAL LOW (ref 8.9–10.3)
Chloride: 109 mmol/L (ref 98–111)
Creatinine, Ser: 0.66 mg/dL (ref 0.61–1.24)
GFR, Estimated: >60 mL/min (ref >60)
Glucose, Bld: 70 mg/dL (ref 70–99)
Potassium: 3.4 mmol/L — ABNORMAL LOW (ref 3.5–5.1)
Sodium: 138 mmol/L (ref 135–145)
Total Bilirubin: 1.3 mg/dL — ABNORMAL HIGH (ref 0.3–1.2)
Total Protein: 4.9 g/dL — ABNORMAL LOW (ref 6.5–8.1)

## 2020-07-23 LAB — CBC
HCT: 33.2 % — ABNORMAL LOW (ref 39.0–52.0)
Hemoglobin: 11.1 g/dL — ABNORMAL LOW (ref 13.0–17.0)
MCH: 36.9 pg — ABNORMAL HIGH (ref 26.0–34.0)
MCHC: 33.4 g/dL (ref 30.0–36.0)
MCV: 110.3 fL — ABNORMAL HIGH (ref 80.0–100.0)
Platelets: 203 10*3/uL (ref 150–400)
RBC: 3.01 MIL/uL — ABNORMAL LOW (ref 4.22–5.81)
RDW: 12.7 % (ref 11.5–15.5)
WBC: 5.6 10*3/uL (ref 4.0–10.5)
nRBC: 0 % (ref 0.0–0.2)

## 2020-07-23 LAB — PHOSPHORUS: Phosphorus: 2.7 mg/dL (ref 2.5–4.6)

## 2020-07-23 LAB — MAGNESIUM: Magnesium: 1.5 mg/dL — ABNORMAL LOW (ref 1.7–2.4)

## 2020-07-23 LAB — TSH: TSH: 2.849 u[IU]/mL (ref 0.350–4.500)

## 2020-07-23 MED ORDER — METHOCARBAMOL 500 MG PO TABS
750.0000 mg | ORAL_TABLET | Freq: Three times a day (TID) | ORAL | Status: DC | PRN
  Administered 2020-07-23 (×2): 750 mg via ORAL
  Filled 2020-07-23 (×2): qty 2

## 2020-07-23 MED ORDER — BACLOFEN 10 MG PO TABS: 10.0000 mg | ORAL_TABLET | Freq: Three times a day (TID) | ORAL | Status: DC | PRN

## 2020-07-23 MED ORDER — POTASSIUM CHLORIDE CRYS ER 20 MEQ PO TBCR
40.0000 meq | EXTENDED_RELEASE_TABLET | Freq: Once | ORAL | Status: AC
  Administered 2020-07-23: 40 meq via ORAL
  Filled 2020-07-23: qty 2

## 2020-07-23 MED ORDER — ACETAMINOPHEN 325 MG PO TABS
650.0000 mg | ORAL_TABLET | Freq: Four times a day (QID) | ORAL | Status: DC
  Administered 2020-07-23 – 2020-08-09 (×65): 650 mg via ORAL
  Filled 2020-07-23 (×68): qty 2

## 2020-07-23 MED ORDER — SODIUM CHLORIDE 0.9 % IV SOLN: INTRAVENOUS | Status: DC

## 2020-07-23 MED ORDER — ACETAMINOPHEN 325 MG PO TABS: 650.0000 mg | ORAL_TABLET | Freq: Four times a day (QID) | ORAL | Status: DC | PRN

## 2020-07-23 NOTE — Progress Notes (Addendum)
Pt responds to voice and is oriented x3. When asked what brought him to the hospital, pt responded, "which story do you want?" Pt unable to answer situational question and is still impulsive when more alert. BP elevated. Ativan PRN recently given.   Pt able to describe chronic lower back pain and mildly recall falling at home. Lidocaine Patch applied.

## 2020-07-23 NOTE — Progress Notes (Addendum)
Family Medicine Teaching Service Daily Progress Note Intern Pager: [phone]  Patient name: Anthony [name] Medical record number: [id number] Date of birth: [birth year] Age: 65 y.o. Gender: male  Primary Care Provider: Carollee Leitz, MD Consultants: PT/OT Code Status: Full  Pt Overview and Major Events to Date:  Pt with history of alcohol use disorder with complicated WDLs/DTs.   Assessment and Plan: Anthony A Littleis a 65 y.o.male being treated for acute alcohol withdrawal. PMH is significant for alcohol use disorder with prior withdrawal and DTs, HFpEF (last Echo 2019), HTN, chronic low back pain, anxiety, depression, and h/o right pulmonary embolus.  Alcohol withdrawal Alcohol Use Disorder  Elevated AST: CIWAs overnight 2>6>4. Required 1mg  of Ativan at 0200. Patient's mental status improved this morning.  Conversant and oriented x3, though his thought is disorganized and speech is slurred. Reports motivation to treat his AUD.  - CIWA monitoring with Ativan PRN - Day 2/4 of Librium - Thiamine/Folate daily - Daily CMP, mag, phos - OOB with assistance only - Heart healthy diet - PT/OT   Hypomagnesemia: Magnesium 1.5 despite 2g IV infusion yesterday. K also decreased to 3.4 today. - Day 2/5 of daily 2g Mag infusion  -We will check AM magnesium  Hypokalemia: A.m. potassium 3.4. -We will give 40 kdur -A.m. BMP  Low back pain: Chronic, stable. Still having moderate back pain this morning despite lidocaine patch since last night.Home meds include robaxin750mg  q8h prn and Tylenol prn.  - Given his improved mental status this am, will restart home Robaxin - Scheduling Tylenol 650mg  q6 - Lidocaine patch -We will also start baclofen -In discharge summary will discuss recommendation for PCP potentially discontinuing Robaxin as it is beers criteria  Elevated CK  Hemoglobinuria: CK [phone]. Small hemoglobin on admission UA. Reassuring that this is coming down with adequate  hydration and no AKI (Cr 0.66). Does not appear to be true Rhabdomyolysis, most likely due to dehydration given that he does not know how long he had been down prior to being brought to the ED. Euvolemic on exam. +8L since admission.  - Will reduce IVF to 136mL/hr  - Consider repeat UA at some point to ensure resolution of Hgb  Macrocytic Anemia: Hgb stable 11.5>9.7>11.1 with MCV 110.  -Continue home ferrous sulfate  Hypertension- chronic. Stable:  Has remained normotensive since arrival with most recent BP 118/83. -Monitor BP -Continue home meds  HFpEF grade 1 diastolic dysfunction:  No LE edema or abdominal fluid on exam. Lungs clear. Appears euvolemic. -Continue to monitor   Borderline Prolonged QTc: Corrected QTc 464 on admission EKG.  -Avoid QT prolonging agents  FEN/GI:Heart healthy diet Prophylaxis:Lovenox  Status is: Progressive  Remains inpatient appropriate because:Inpatient level of care appropriate due to severity of illness   Dispo: The patient is from: Home              Anticipated d/c is to: Home              Anticipated d/c date is: 2 days              Patient currently is not medically stable to d/c.   Subjective:  Patient sitting up in bed and eating breakfast during interview. Conversant and engaged in conversation, though thought process is somewhat disorganized. Speech is slurred but comprehensible. Reports no complaints other than back pain. This is a chronic issue for him and is treated on an outpatient basis with Robaxin and Tylenol. He received a lidocaine patch last night with minimal  relief. Requesting Tylenol and Robaxin at this time.  Denies any headache, agitation, visual, or tactile disturbances.   Objective: Temp:  [97.5 F (36.4 C)-98.2 F (36.8 C)] 98.1 F (36.7 C) (01/04 1218) Pulse Rate:  [87-107] 87 (01/04 0803) Resp:  [12-50] 16 (01/04 0803) BP: (116-158)/(72-114) 131/94 (01/04 0803) SpO2:  [90 %-100 %] 96 % (01/04  0803) Weight:  [67.5 kg] 67.5 kg (01/04 0030) Physical Exam: General: Sitting up in bed and conversing Cardiovascular: RRR, no m/r/g Respiratory: Lungs CTAB bilaterally Abdomen: Non-tender, non-distended, without hepatomegaly or ascites Extremities: No LE edema, Hands in mitts, no tremor appreciated Mental Status: A&Ox3, Thought disorganized, speech slurred but comprehensible  Laboratory: Recent Labs  Lab 07/21/20 1029 07/22/20 0414 07/23/20 0533  WBC 12.8* 7.4 5.6  HGB 11.5* 9.7* 11.1*  HCT 33.2* 29.8* 33.2*  PLT 276 209 203   Recent Labs  Lab 07/21/20 1141 07/22/20 0414 07/23/20 0533  NA 137 139 138  K 4.2 3.7 3.4*  CL 106 110 109  CO2 18* 17* 19*  BUN 7* 12 6*  CREATININE 1.05 0.84 0.66  CALCIUM 8.5* 7.9* 7.8*  PROT 6.4* 5.0* 4.9*  BILITOT 1.2 0.6 1.3*  ALKPHOS 176* 149* 139*  ALT 33 39 41  AST 81* 134* 124*  GLUCOSE 81 90 70    Imaging/Diagnostic Tests: No new studies.   Dorothyann Gibbs, Medical Student 07/23/2020, 12:26 PM   RESIDENT ATTESTATION   I have seen and examined this patient.   I have discussed the findings and exam with the student doctor and agree with the above note, which I have edited appropriately. I helped develop the management plan that is described in the student doctor's note, and I agree with the content.  Jackelyn Poling, DO PGY-2 Family Medicine Resident

## 2020-07-24 DIAGNOSIS — F10231 Alcohol dependence with withdrawal delirium: Secondary | ICD-10-CM | POA: Diagnosis not present

## 2020-07-24 DIAGNOSIS — F1020 Alcohol dependence, uncomplicated: Secondary | ICD-10-CM | POA: Diagnosis not present

## 2020-07-24 DIAGNOSIS — D539 Nutritional anemia, unspecified: Secondary | ICD-10-CM | POA: Diagnosis not present

## 2020-07-24 LAB — MAGNESIUM: Magnesium: 1.4 mg/dL — ABNORMAL LOW (ref 1.7–2.4)

## 2020-07-24 LAB — COMPREHENSIVE METABOLIC PANEL
ALT: 38 U/L (ref 0–44)
AST: 84 U/L — ABNORMAL HIGH (ref 15–41)
Albumin: 1.7 g/dL — ABNORMAL LOW (ref 3.5–5.0)
Alkaline Phosphatase: 138 U/L — ABNORMAL HIGH (ref 38–126)
Anion gap: 6 (ref 5–15)
BUN: <5 mg/dL — ABNORMAL LOW (ref 8–23)
CO2: 23 mmol/L (ref 22–32)
Calcium: 7.6 mg/dL — ABNORMAL LOW (ref 8.9–10.3)
Chloride: 107 mmol/L (ref 98–111)
Creatinine, Ser: 0.56 mg/dL — ABNORMAL LOW (ref 0.61–1.24)
GFR, Estimated: >60 mL/min (ref >60)
Glucose, Bld: 93 mg/dL (ref 70–99)
Potassium: 3.5 mmol/L (ref 3.5–5.1)
Sodium: 136 mmol/L (ref 135–145)
Total Bilirubin: 0.7 mg/dL (ref 0.3–1.2)
Total Protein: 4.7 g/dL — ABNORMAL LOW (ref 6.5–8.1)

## 2020-07-24 MED ORDER — POTASSIUM CHLORIDE CRYS ER 20 MEQ PO TBCR
40.0000 meq | EXTENDED_RELEASE_TABLET | Freq: Once | ORAL | Status: AC
  Administered 2020-07-24: 40 meq via ORAL
  Filled 2020-07-24: qty 2

## 2020-07-24 MED ORDER — BACLOFEN 10 MG PO TABS
10.0000 mg | ORAL_TABLET | Freq: Three times a day (TID) | ORAL | Status: DC | PRN
  Administered 2020-07-26 – 2020-08-07 (×3): 10 mg via ORAL
  Filled 2020-07-24 (×3): qty 1

## 2020-07-24 MED ORDER — MAGNESIUM SULFATE 2 GM/50ML IV SOLN
2.0000 g | Freq: Once | INTRAVENOUS | Status: AC
  Administered 2020-07-24: 2 g via INTRAVENOUS
  Filled 2020-07-24: qty 50

## 2020-07-24 NOTE — Plan of Care (Signed)

## 2020-07-24 NOTE — Progress Notes (Signed)
Family Medicine Teaching Service Daily Progress Note Intern Pager: [phone]  Patient name: Anthony [name] Medical record number: [id number] Date of birth: 03-04-1956 Age: 65 y.o. Gender: male  Primary Care Provider: Dana Allan, MD Consultants: None Code Status: FULL  Pt Overview and Major Events to Date:  1/2: admitted  Assessment and Plan:  Anthony A Littleis a 64 y.o.male admitted with acute alcohol withdrawal. PMH is significant for alcohol use disorder with prior withdrawal and DTs,HFpEF (last Echo 2019),HTN, chronic low back pain, anxiety, depression, andh/oright pulmonary embolus.  Alcohol withdrawal Alcohol Use Disorder  Elevated AST Most recent CIWAs 4>1>3>1. AST improving (84 today from 124 yesterday). Last drink 1/2 in the am. -CIWA with Ativan -Continue Librium taper -Thiamine/folate daily -Daily CMP, mag, phos -PT/OT eval -d/c IV fluids  Hypomagnesemia  Hypokalemia Mag 1.4 this morning, K slightly improved at 3.5 -Replete with 2g Mag x2 and PO potassium  Chronic Low Back Pain Home meds: robaxin 750mg  q8h prn and Tylenol prn -Lidocaine patch -d/c Robaxin -Baclofen 10mg  TID prn  HTN BP wnl over past 24h. Most recent BP 129/98. -Continue home Amlodipine and HCTZ  HFpEF Chronic, stable. Last echo with grade 1 diastolic dysfunction.  FEN/GI: Heart healthy diet PPx: Lovenox   Status is: Inpatient Remains inpatient appropriate because:Persistent severe electrolyte disturbances and Inpatient level of care appropriate due to severity of illness   Dispo: The patient is from: Home              Anticipated d/c is to: Home              Anticipated d/c date is: 1 day              Patient currently is not medically stable to d/c.    Subjective:  No acute events overnight. Patient has mild rib pain this morning but otherwise has no complaints.   Objective: Temp:  [98 F (36.7 C)-98.2 F (36.8 C)] 98.2 F (36.8 C) (01/05 0450) Pulse Rate:   [80-108] 93 (01/05 0450) Resp:  [16-24] 19 (01/05 0450) BP: (100-136)/(72-98) 129/98 (01/05 0450) SpO2:  [92 %-100 %] 100 % (01/05 0450) Weight:  [68 kg] 68 kg (01/05 0459) Physical Exam: General: alert, resting comfortably, NAD Cardiovascular: RRR, normal S1/S2 without m/r/g Respiratory: normal WOB, lungs CTAB Abdomen: soft, nontender, nondistended Extremities: no peripheral edema Neuro: grossly intact, oriented to person, place and time  Laboratory: Recent Labs  Lab 07/21/20 1029 07/22/20 0414 07/23/20 0533  WBC 12.8* 7.4 5.6  HGB 11.5* 9.7* 11.1*  HCT 33.2* 29.8* 33.2*  PLT 276 209 203   Recent Labs  Lab 07/22/20 0414 07/23/20 0533 07/24/20 0254  NA 139 138 136  K 3.7 3.4* 3.5  CL 110 109 107  CO2 17* 19* 23  BUN 12 6* <5*  CREATININE 0.84 0.66 0.56*  CALCIUM 7.9* 7.8* 7.6*  PROT 5.0* 4.9* 4.7*  BILITOT 0.6 1.3* 0.7  ALKPHOS 149* 139* 138*  ALT 39 41 38  AST 134* 124* 84*  GLUCOSE 90 70 93   Mag 1.4  Imaging/Diagnostic Tests: No new imaging/diagnostic tests.   09/20/20, MD 07/24/2020, 6:13 AM PGY-1, [facility] Family Medicine FPTS Intern pager: [phone], text pages welcome

## 2020-07-24 NOTE — Progress Notes (Signed)
CCMD notified RN that pt had a burst of HR in the 130s-150s sustaining for about 1 mins. Patient just had two small bowel movements prior to this incident. Patient states he feels fine and VS are stable. EKG performed. Paged MD to notify. Responded with the team re-rounding and additional meds ordered.

## 2020-07-24 NOTE — Evaluation (Signed)
Physical Therapy Evaluation Patient Details Name: Anthony [name] MRN: [id number] DOB: 04/24/1956 Today's Date: 07/24/2020   History of Present Illness  Anthony [name] is a 65 y.o. male being treated for acute alcohol withdrawal . PMH is significant for alcohol use disorder with prior withdrawal and DTs, HFpEF (last Echo 2019), HTN, chronic low back pain, anxiety, depression, and h/o right pulmonary embolus.  Clinical Impression  Prior to admission, pt lives alone in an apartment and likely independent; reports taking the bus for transportation. On PT evaluation, pt presents with cognitive impairments with deficits noted in the areas of orientation, memory, awareness, problem solving, and command following. When asked where he was, pt stating, "earth," and unable to correctly state place with cues. Pt received with bowel incontinence (per RN this is 3rd episode today). Pt requiring min assist for transfers to standing to a walker to provide peri care. Further ambulation deferred as pt HR randomly spiking up to 170's-182 bpm for ~1-2 minutes, otherwise maintaining 111-120 bpm. Pt asymptomatic; RN notified and aware. Currently recommending SNF in setting of deficits and decreased caregiver support to maximize functional mobility.     Follow Up Recommendations SNF;Supervision/Assistance - 24 hour    Equipment Recommendations  Rolling walker with 5" wheels    Recommendations for Other Services       Precautions / Restrictions Precautions Precautions: Fall Restrictions Weight Bearing Restrictions: No      Mobility  Bed Mobility Overal bed mobility: Needs Assistance Bed Mobility: Rolling;Supine to Sit;Sit to Supine Rolling: Min guard   Supine to sit: Supervision Sit to supine: Min assist   General bed mobility comments: MinA for LE negotiation back into bed    Transfers Overall transfer level: Needs assistance Equipment used: Rolling walker (2 wheeled);2 person hand held  assist Transfers: Sit to/from Stand Sit to Stand: Min assist         General transfer comment: MinA to rise from edge of bed  Ambulation/Gait             General Gait Details: deferred due to tachycardia  Stairs            Wheelchair Mobility    Modified Rankin (Stroke Patients Only)       Balance Overall balance assessment: Needs assistance Sitting-balance support: No upper extremity supported;Feet supported Sitting balance-Leahy Scale: Fair Sitting balance - Comments: cues to scoot to EOB   Standing balance support: Bilateral upper extremity supported Standing balance-Leahy Scale: Poor Standing balance comment: support for stability; requires external support                             Pertinent Vitals/Pain Pain Assessment: No/denies pain    Home Living Family/patient expects to be discharged to:: Private residence Living Arrangements: Alone   Type of Home: Apartment Home Access: Level entry;Elevator     Home Layout: One level Home Equipment: None      Prior Function Level of Independence: Independent         Comments: Pt reports to take care of himself (but unable to go into detail) and uses the bus for transportation.     Hand Dominance   Dominant Hand: Right    Extremity/Trunk Assessment   Upper Extremity Assessment Upper Extremity Assessment: Defer to OT evaluation    Lower Extremity Assessment Lower Extremity Assessment: Overall WFL for tasks assessed    Cervical / Trunk Assessment Cervical / Trunk Assessment: Kyphotic  Communication  Communication: Expressive difficulties;Other (comment) (difficult to understand due to accent and lack of annunciation)  Cognition Arousal/Alertness: Awake/alert Behavior During Therapy: Flat affect Overall Cognitive Status: Impaired/Different from baseline Area of Impairment: Orientation;Memory;Following commands;Safety/judgement;Awareness;Problem solving                  Orientation Level: Disoriented to;Place;Time;Situation   Memory: Decreased short-term memory Following Commands: Follows one step commands inconsistently;Follows multi-step commands with increased time Safety/Judgement: Decreased awareness of safety;Decreased awareness of deficits Awareness: Emergent Problem Solving: Slow processing;Decreased initiation;Difficulty sequencing;Requires verbal cues;Requires tactile cues General Comments: Pt tangential at times; pt asked "what do you do during the day?" and 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 Pt requiring cues and increased time to say date.      General Comments      Exercises     Assessment/Plan    PT Assessment Patient needs continued PT services  PT Problem List Decreased strength;Decreased activity tolerance;Decreased balance;Decreased mobility;Decreased cognition;Decreased safety awareness;Cardiopulmonary status limiting activity       PT Treatment Interventions DME instruction;Gait training;Functional mobility training;Stair training;Therapeutic activities;Therapeutic exercise;Balance training;Cognitive remediation;Patient/family education    PT Goals (Current goals can be found in the Care Plan section)  Acute Rehab PT Goals Patient Stated Goal: did not state PT Goal Formulation: With patient Time For Goal Achievement: 08/07/20 Potential to Achieve Goals: Fair    Frequency Min 3X/week   Barriers to discharge Decreased caregiver support      Co-evaluation PT/OT/SLP Co-Evaluation/Treatment: Yes Reason for Co-Treatment: For patient/therapist safety;To address functional/ADL transfers PT goals addressed during session: Mobility/safety with mobility         AM-PAC PT "6 Clicks" Mobility  Outcome Measure Help needed turning from your back to your side while in a flat bed without using bedrails?: A Bleich Help needed moving from lying on your back to sitting on the side of a flat bed without using  bedrails?: A Clauss Help needed moving to and from a bed to a chair (including a wheelchair)?: A Fandino Help needed standing up from a chair using your arms (e.g., wheelchair or bedside chair)?: A Talwar Help needed to walk in hospital room?: A Garrido Help needed climbing 3-5 steps with a railing? : A Lot 6 Click Score: 17    End of Session Equipment Utilized During Treatment: Gait belt Activity Tolerance: Treatment limited secondary to medical complications (Comment) Patient left: in bed;with call bell/phone within reach Nurse Communication: Mobility status PT Visit Diagnosis: Unsteadiness on feet (R26.81);Muscle weakness (generalized) (M62.81);History of falling (Z91.81)    Time: [id number] PT Time Calculation (min) (ACUTE ONLY): 27 min   Charges:   PT Evaluation $PT Eval Moderate Complexity: 1 Mod          Wyona Almas, PT, DPT Acute Rehabilitation Services Pager [phone] Office [phone]   Deno Etienne 07/24/2020, 2:17 PM

## 2020-07-24 NOTE — Evaluation (Addendum)
Occupational Therapy Evaluation Patient Details Name: Anthony [name] MRN: [id number] DOB: July 19, 1956 Today's Date: 07/24/2020    History of Present Illness Anthony [name] is a 65 y.o. male being treated for acute alcohol withdrawal . PMH is significant for alcohol use disorder with prior withdrawal and DTs, HFpEF (last Echo 2019), HTN, chronic low back pain, anxiety, depression, and h/o right pulmonary embolus.   Clinical Impression   Pt PTA: Pt reports living alone in apartment with an elevator and was most likely independent, takes bus to appointments. Pt currently, minguardA overall for bed mobility; minA +2 for transfers and pt very unsteady with standing with RW; pt asked to stand to scoot towards [facility] and pt performed task in long sitting instead. Pt not following all commands and requires cues to attend to task, for sequencing and often needed directions repeated.  Pt modA +2 for most ADL tasks. Pt unaware of small bowel movement and unable to properly clean self at this time without assist. Pt unsafe to go home alone at this time. Pt would benefit from continued OT skilled services for ADL, mobility and safety in SNF setting. OT following acutely. HR 160s then 180s with minimal exertion and asymptomatic.  ** pt could benefit from [facility] with 24/7 with family support if SNF unable to take pt.     Follow Up Recommendations  SNF;Supervision/Assistance - 24 hour    Equipment Recommendations  3 in 1 bedside commode    Recommendations for Other Services       Precautions / Restrictions Precautions Precautions: Fall Restrictions Weight Bearing Restrictions: No      Mobility Bed Mobility Overal bed mobility: Needs Assistance Bed Mobility: Rolling;Supine to Sit;Sit to Supine Rolling: Min guard   Supine to sit: Supervision Sit to supine: Min guard   General bed mobility comments: use of bed rail    Transfers Overall transfer level: Needs assistance Equipment used: Rolling walker (2  wheeled);2 person hand held assist Transfers: Sit to/from Stand           General transfer comment: assist for sequencing; Unable to progress due to tachycardia    Balance Overall balance assessment: Needs assistance Sitting-balance support: Bilateral upper extremity supported Sitting balance-Leahy Scale: Fair Sitting balance - Comments: cues to scoot to EOB   Standing balance support: Bilateral upper extremity supported Standing balance-Leahy Scale: Poor Standing balance comment: support for stability; requires external support                           ADL either performed or assessed with clinical judgement   ADL Overall ADL's : Needs assistance/impaired Eating/Feeding: Set up;Sitting   Grooming: Set up;Sitting   Upper Body Bathing: Minimal assistance;Sitting   Lower Body Bathing: Maximal assistance;Sitting/lateral leans   Upper Body Dressing : Minimal assistance;Sitting   Lower Body Dressing: Maximal assistance;Sitting/lateral leans   Toilet Transfer: +2 for physical assistance;+2 for safety/equipment;Cueing for safety;Cueing for sequencing;Stand-pivot;Moderate assistance   Toileting- Clothing Manipulation and Hygiene: Moderate assistance;+2 for physical assistance;+2 for safety/equipment;Cueing for safety;Sitting/lateral lean;Sit to/from stand       Functional mobility during ADLs: Moderate assistance;+2 for physical assistance;+2 for safety/equipment;Cueing for safety;Cueing for sequencing General ADL Comments: Pt limited by decreased cognition, decreased strength and decreased ability to care for self.     Vision Patient Visual Report: No change from baseline Vision Assessment?: No apparent visual deficits     Perception     Praxis      Pertinent  Vitals/Pain Pain Assessment: No/denies pain     Hand Dominance Right   Extremity/Trunk Assessment Upper Extremity Assessment Upper Extremity Assessment: Generalized weakness   Lower Extremity  Assessment Lower Extremity Assessment: Generalized weakness;Defer to PT evaluation   Cervical / Trunk Assessment Cervical / Trunk Assessment: Kyphotic   Communication Communication Communication: Expressive difficulties;Other (comment) (difficult to understand due to accent and lack of annunciation)   Cognition Arousal/Alertness: Awake/alert Behavior During Therapy: Flat affect Overall Cognitive Status: Impaired/Different from baseline Area of Impairment: Orientation;Memory;Following commands;Safety/judgement;Awareness;Problem solving                 Orientation Level: Disoriented to;Place;Time;Situation   Memory: Decreased short-term memory Following Commands: Follows one step commands inconsistently;Follows multi-step commands with increased time Safety/Judgement: Decreased awareness of safety;Decreased awareness of deficits Awareness: Emergent Problem Solving: Slow processing;Decreased initiation;Difficulty sequencing;Requires verbal cues;Requires tactile cues General Comments: Pt tangential at times; pt asked "what do you do during the day?" and 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 Pt requiring cues and increased time to say date.   General Comments  Pt's HR Marin 180 BPM  and 166 BPM throughout session;  Pt remains asymptomatic.    Exercises     Shoulder Instructions      Home Living Family/patient expects to be discharged to:: Private residence Living Arrangements: Alone   Type of Home: Apartment Home Access: Level entry;Elevator     Home Layout: One level     Bathroom Shower/Tub: Occupational psychologist: Standard     Home Equipment: None          Prior Functioning/Environment Level of Independence: Independent        Comments: Pt reports to take care of himself (but unable to go into detail) and uses the bus for transportation.        OT Problem List: Decreased strength;Decreased range of motion;Decreased activity  tolerance;Impaired balance (sitting and/or standing);Decreased safety awareness;Decreased knowledge of use of DME or AE;Cardiopulmonary status limiting activity;Impaired UE functional use;Pain;Increased edema      OT Treatment/Interventions: Self-care/ADL training;Therapeutic exercise;Energy conservation;DME and/or AE instruction;Therapeutic activities;Patient/family education;Balance training;Cognitive remediation/compensation    OT Goals(Current goals can be found in the care plan section) Acute Rehab OT Goals Patient Stated Goal: did not state OT Goal Formulation: With patient Time For Goal Achievement: 08/07/20 Potential to Achieve Goals: Good ADL Goals Pt Will Perform Grooming: with min guard assist;standing Pt Will Perform Lower Body Dressing: with min guard assist;sit to/from stand Pt Will Transfer to Toilet: with supervision;ambulating;bedside commode Pt Will Perform Toileting - Clothing Manipulation and hygiene: with min guard assist;sitting/lateral leans;sit to/from stand Pt/caregiver will Perform Home Exercise Program: Increased strength;Both right and left upper extremity;With Supervision Additional ADL Goal #1: Pt will complete x10 mins of ADL tasks with 1 seated rest break with minguardA overall.  OT Frequency: Min 2X/week   Barriers to D/C:            Co-evaluation              AM-PAC OT "6 Clicks" Daily Activity     Outcome Measure Help from another person eating meals?: A Iacobucci Help from another person taking care of personal grooming?: A Doebler Help from another person toileting, which includes using toliet, bedpan, or urinal?: A Lot Help from another person bathing (including washing, rinsing, drying)?: A Lot Help from another person to put on and taking off regular upper body clothing?: A Lot Help from another person to put on  and taking off regular lower body clothing?: A Lot 6 Click Score: 14   End of Session Equipment Utilized During Treatment: Gait  belt;Rolling walker Nurse Communication: Mobility status  Activity Tolerance: Patient tolerated treatment well;Treatment limited secondary to medical complications (Comment) Patient left: in bed;with call bell/phone within reach;with bed alarm set  OT Visit Diagnosis: Unsteadiness on feet (R26.81);Muscle weakness (generalized) (M62.81);Other symptoms and signs involving cognitive function                Time: 3419-3790 OT Time Calculation (min): 29 min Charges:  OT General Charges $OT Visit: 1 Visit OT Evaluation $OT Eval Moderate Complexity: 1 Mod  Flora Lipps, OTR/L Acute Rehabilitation Services Pager: [phone] Office: [phone]   Elizabeht Suto C 07/24/2020, 1:44 PM

## 2020-07-25 DIAGNOSIS — F1020 Alcohol dependence, uncomplicated: Secondary | ICD-10-CM | POA: Diagnosis not present

## 2020-07-25 DIAGNOSIS — M6282 Rhabdomyolysis: Secondary | ICD-10-CM

## 2020-07-25 DIAGNOSIS — F10231 Alcohol dependence with withdrawal delirium: Secondary | ICD-10-CM | POA: Diagnosis not present

## 2020-07-25 DIAGNOSIS — Z87898 Personal history of other specified conditions: Secondary | ICD-10-CM | POA: Diagnosis not present

## 2020-07-25 DIAGNOSIS — E860 Dehydration: Secondary | ICD-10-CM | POA: Diagnosis not present

## 2020-07-25 LAB — COMPREHENSIVE METABOLIC PANEL
ALT: 36 U/L (ref 0–44)
AST: 60 U/L — ABNORMAL HIGH (ref 15–41)
Albumin: 1.7 g/dL — ABNORMAL LOW (ref 3.5–5.0)
Alkaline Phosphatase: 123 U/L (ref 38–126)
Anion gap: 7 (ref 5–15)
BUN: <5 mg/dL — ABNORMAL LOW (ref 8–23)
CO2: 22 mmol/L (ref 22–32)
Calcium: 7.6 mg/dL — ABNORMAL LOW (ref 8.9–10.3)
Chloride: 108 mmol/L (ref 98–111)
Creatinine, Ser: 0.49 mg/dL — ABNORMAL LOW (ref 0.61–1.24)
GFR, Estimated: >60 mL/min (ref >60)
Glucose, Bld: 82 mg/dL (ref 70–99)
Potassium: 3.6 mmol/L (ref 3.5–5.1)
Sodium: 137 mmol/L (ref 135–145)
Total Bilirubin: 0.9 mg/dL (ref 0.3–1.2)
Total Protein: 4.7 g/dL — ABNORMAL LOW (ref 6.5–8.1)

## 2020-07-25 LAB — PHOSPHORUS: Phosphorus: 2.5 mg/dL (ref 2.5–4.6)

## 2020-07-25 LAB — MAGNESIUM: Magnesium: 1.5 mg/dL — ABNORMAL LOW (ref 1.7–2.4)

## 2020-07-25 MED ORDER — MAGNESIUM SULFATE 2 GM/50ML IV SOLN
2.0000 g | Freq: Once | INTRAVENOUS | Status: AC
  Administered 2020-07-25: 2 g via INTRAVENOUS
  Filled 2020-07-25: qty 50

## 2020-07-25 NOTE — TOC Initial Note (Addendum)
Transition of Care [facility]) - Initial/Assessment Note    Patient Details  Name: Anthony [name] MRN: [id number] Date of Birth: 1956-03-29  Transition of Care [facility]) CM/SW Contact:    Carmina Miller, LCSWA Phone Number: 07/25/2020, 1:06 PM  Clinical Narrative:                 1:34 pm-CSW received a call from Anthony [name] stated that he has not spoken with pt in over a month because pt's phone number is disconnected. Anthony [name] states he hopes his brother would accept any help offered. Anthony [name] states pt does get SSI. Anthony [name] states he will be more than happy to help with placement options if need be. Anthony [name] doesn't live in [place], but is available by phone. Anthony [name] requested a callback from MD about pt's status, CSW sent MD a secure chat.  CSW reached out to pt's brother Anthony [name] in reference to pt needing SNF placement, had to leave a vm as he did not answer.         Patient Goals and CMS Choice        Expected Discharge Plan and Services                                                Prior Living Arrangements/Services                       Activities of Daily Living      Permission Sought/Granted                  Emotional Assessment              Admission diagnosis:  Alcohol withdrawal delirium (HCC) [F10.231] Alcohol withdrawal (HCC) [F10.239] Dehydration [E86.0] Hypoglycemia [E16.2] Patient Active Problem List   Diagnosis Date Noted  . Rhabdomyolysis 07/22/2020  . Cerebral ventriculomegaly 07/22/2020  . Hemoglobin decreased 07/22/2020  . History of delirium tremons 07/22/2020  . Hypoalbuminemia due to protein-calorie malnutrition (HCC) 07/22/2020  . Tobacco use disorder 07/22/2020  . Alcohol withdrawal delirium (HCC)   . Dehydration   . Alcohol abuse with alcohol-induced mood disorder (HCC) 07/18/2018  . Depression 05/26/2018  . Need for immunization against influenza 04/25/2018  . Alcohol withdrawal (HCC) 09/01/2017  . Actinic keratosis 11/27/2016  .  Macrocytic anemia 11/23/2016  . History of pulmonary embolus (PE) 11/02/2016  . Hiatal hernia 09/14/2016  . Skin cancer 08/13/2016  . Poor social situation 06/09/2013  . Tinea versicolor 06/08/2013  . 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 HTN (hypertension) 04/11/2012  . Alcohol use disorder, severe, dependence (HCC) 09/16/2006   PCP:  Dana Allan, MD Pharmacy:   [facility] [phone] [place], [place] - [address] AT [facility] [address] & [facility] [address] [place] [address] Phone: [phone] Fax: [phone]     Social Determinants of Health (SDOH) Interventions    Readmission Risk Interventions No flowsheet data found.

## 2020-07-25 NOTE — TOC Initial Note (Signed)
Transition of Care [facility]) - Initial/Assessment Note    Patient Details  Name: Anthony [name] MRN: [id number] Date of Birth: 31-May-1956  Transition of Care [facility]) CM/SW Contact:    Bethann Berkshire, [place] Phone Number: 07/25/2020, 2:48 PM  Clinical Narrative:                 CSW met with for SNF consult. Pt lives in an apartment in [place] alone. He states he has no family. Pt is agreeable to SNF and understands his SS check would need to be uses for SNF. FL2 to be completed and faxed in hub.   Expected Discharge Plan: Skilled Nursing Facility Barriers to Discharge: Continued Medical Work up   Patient Goals and CMS Choice Patient states their goals for this hospitalization and ongoing recovery are:: Rehab CMS Medicare.gov Compare Post Acute Care list provided to:: Patient Choice offered to / list presented to : Patient  Expected Discharge Plan and Services Expected Discharge Plan: [place] arrangements for the past 2 months: Apartment                                      Prior Living Arrangements/Services Living arrangements for the past 2 months: Apartment Lives with:: Self          Need for Family Participation in Patient Care: No (Comment) Care giver support system in place?: No (comment)      Activities of Daily Living      Permission Sought/Granted                  Emotional Assessment Appearance:: Disheveled Attitude/Demeanor/Rapport: Engaged Affect (typically observed): Accepting Orientation: : Oriented to Self,Oriented to Place,Oriented to  Time,Oriented to Situation Alcohol / Substance Use: Not Applicable Psych Involvement: No (comment)  Admission diagnosis:  Alcohol withdrawal delirium ([place]) [F10.231] Alcohol withdrawal ([place]) [F10.239] Dehydration [E86.0] Hypoglycemia [E16.2] Patient Active Problem List   Diagnosis Date Noted  . Rhabdomyolysis 07/22/2020  . Cerebral ventriculomegaly 07/22/2020  . Hemoglobin  decreased 07/22/2020  . History of delirium tremons 07/22/2020  . Hypoalbuminemia due to protein-calorie malnutrition ([place]) 07/22/2020  . Tobacco use disorder 07/22/2020  . Alcohol withdrawal delirium ([place])   . Dehydration   . Alcohol abuse with alcohol-induced mood disorder ([place]) 07/18/2018  . Depression 05/26/2018  . Need for immunization against influenza 04/25/2018  . Alcohol withdrawal ([place]) 09/01/2017  . Actinic keratosis 11/27/2016  . Macrocytic anemia 11/23/2016  . History of pulmonary embolus (PE) 11/02/2016  . Hiatal hernia 09/14/2016  . Skin cancer 08/13/2016  . Poor social situation 06/09/2013  . Tinea versicolor 06/08/2013  . 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 HTN (hypertension) 04/11/2012  . Alcohol use disorder, severe, dependence ([place]) 09/16/2006   PCP:  Carollee Leitz, MD Pharmacy:   [facility] STORE [place], [place] - [place] [address] AT [place] [address] Phone: [phone] Fax: [phone]     Social Determinants of Health (SDOH) Interventions    Readmission Risk Interventions No flowsheet data found.

## 2020-07-25 NOTE — Progress Notes (Signed)
FPTS Interim Progress Note  S:Spoke with patient's brother Cindee Lame who was able to shed further light on his mental status. It seems that Mr. Audino has some baseline confusion, disorientation, and agitation. He was also apparently facing eviction as of Nov 30th and it is unclear whether or not he still has access to the apartment where he had previously been living. His brother presumes that he has been drinking more heavily than usual during the period since Nov. 30th.  His brother also reports some baseline exercise intolerance and states that Mr. Lindroth cannot walk more than a few steps without becoming winded.   O: BP (!) 131/92   Pulse 100   Temp (!) 97.5 F (36.4 C) (Oral)   Resp (!) 21   Ht 5\' 8"  (1.727 m)   Wt 69.3 kg   SpO2 97%   BMI 23.23 kg/m     A/P: - Still planning for SNF placement - Social work made aware of uncertain housing situation  , Medical Student 07/25/2020, 2:18 PM

## 2020-07-25 NOTE — TOC Progression Note (Signed)
Transition of Care [facility]) - Progression Note    Patient Details  Name: Anthony [name] MRN: [id number] Date of Birth: 01/17/1956  Transition of Care [facility]) CM/SW Contact  Dannielle Karvonen Phone Number: 07/25/2020, 2:33 PM  Clinical Narrative:    RE: Anthony [name] Date of Birth:  1956-03-05 Date: 07/25/20  Please be advised that the above-named patient will require a short-term nursing home stay - anticipated 30 days or less for rehabilitation and strengthening.  The plan is for return home.        Expected Discharge Plan and Services                                                 Social Determinants of Health (SDOH) Interventions    Readmission Risk Interventions No flowsheet data found.

## 2020-07-25 NOTE — Plan of Care (Signed)
  Problem: Education: Goal: Knowledge of General Education information will improve Description Including pain rating scale, medication(s)/side effects and non-pharmacologic comfort measures Outcome: Progressing   Problem: Health Behavior/Discharge Planning: Goal: Ability to manage health-related needs will improve Outcome: Progressing   Problem: Elimination: Goal: Will not experience complications related to urinary retention Outcome: Progressing   Problem: Pain Managment: Goal: General experience of comfort will improve Outcome: Progressing   Problem: Safety: Goal: Ability to remain free from injury will improve Outcome: Progressing   

## 2020-07-25 NOTE — Progress Notes (Addendum)
Family Medicine Teaching Service Daily Progress Note Intern Pager: [phone]  Patient name: Anthony [name] [place] Medical record number: [id number] Date of birth: 02-Sep-1955 Age: 65 y.o. Gender: male  Primary Care Provider: Dana Allan, MD Consultants: PT/OT Code Status: Full  Pt Overview and Major Events to Date:  Pt with a history of AUD with complicated withdrawals and DTs here for treatment of withdrawals.  Pt. Is medically stable to discharge to SNF.   Assessment and Plan: Anthony [name] is a 65 y.o. male admitted with acute alcohol withdrawal. PMH is significant for alcohol use disorder with prior withdrawal and DTs, HFpEF (last Echo 2019), HTN, chronic low back pain, anxiety, depression, and h/o right pulmonary embolus.   Alcohol withdrawal  Alcohol Use Disorder  Elevated AST Most recent CIWAs 2. Has not required Ativan for >24 hours.Vitals stable. AST downtrending to 60 this am. Last drink 96 hrs ago. -CIWA with Ativan -Continue Librium taper, Day 4/5 -Thiamine/folate daily -Daily CMP, mag, phos - Pending SNF placement per PT/OT recs   Hypomagnesemia  Hypokalemia Mag 1.5 this morning, K slightly improved at 3.6 -Replete with 2g Mag x2 - AM labs to continue to monitor including magnesium  Chronic Low Back Pain Home meds: robaxin 750mg  q8h prn and Tylenol prn. -Tylenol 650mg  q6 -Lidocaine patch PRN - Dc'ed robaxin yesterday -Baclofen 10mg  TID prn   HTN BP wnl over past 24h. Most recent BP 129/98. -Continue home Amlodipine and HCTZ   HFpEF Chronic, stable. Last echo with grade 1 diastolic dysfunction.   FEN/GI: Heart healthy diet PPx: Lovenox   Status is: Inpatient  Remains inpatient appropriate because:Unsafe d/c plan   Dispo: The patient is from: Home              Anticipated d/c is to: SNF              Anticipated d/c date is: 1 day              Patient currently is medically stable to d/c.   Subjective:  Patient is resting comfortably in bed and eating  breakfast. He is A&Ox3, but is somewhat confused as to how he ended up in the hospital. Disorganized thought may be his baseline. He has no complaints at this time and feels that he is ready to discharge to SNF.   Objective: Temp:  [97.8 F (36.6 C)-98.3 F (36.8 C)] 98.2 F (36.8 C) (01/06 0000) Pulse Rate:  [82-108] 84 (01/06 0600) Resp:  [18-23] 22 (01/06 0600) BP: (97-127)/(68-92) 110/86 (01/06 0600) SpO2:  [93 %-98 %] 96 % (01/06 0600) Weight:  [69.3 kg] 69.3 kg (01/06 0015) Physical Exam: General: Sitting up in bed and eating comfortably Cardiovascular: RRR no m/r/g Respiratory: Lungs CTAB bilaterally.  Abdomen: Non-tender, non-distended. No ascites or hepatomegaly on exam. Extremities: No LE edema.  Mental Status: A&Ox3.  Thought disorganized.   Laboratory: Recent Labs  Lab 07/21/20 1029 07/22/20 0414 07/23/20 0533  WBC 12.8* 7.4 5.6  HGB 11.5* 9.7* 11.1*  HCT 33.2* 29.8* 33.2*  PLT 276 209 203   Recent Labs  Lab 07/23/20 0533 07/24/20 0254 07/25/20 0352  NA 138 136 137  K 3.4* 3.5 3.6  CL 109 107 108  CO2 19* 23 22  BUN 6* <5* <5*  CREATININE 0.66 0.56* 0.49*  CALCIUM 7.8* 7.6* 7.6*  PROT 4.9* 4.7* 4.7*  BILITOT 1.3* 0.7 0.9  ALKPHOS 139* 138* 123  ALT 41 38 36  AST 124* 84* 60*  GLUCOSE 70 93  82    Imaging/Diagnostic Tests: No new imaging or tests  Pearla Dubonnet, Medical Student 07/25/2020, 7:16 AM  Upper Level Addendum:  I have seen and evaluated this patient along with Student Dr. Joelyn Oms and reviewed the above note, making necessary revisions as appropriate. I have performed the physical exam as above.   Lurline Del, DO 07/25/2020, 2:03 PM PGY-2, [place]

## 2020-07-25 NOTE — NC FL2 (Signed)
[place] MEDICAID FL2 LEVEL OF CARE SCREENING TOOL     IDENTIFICATION  Patient Name: Anthony [name] Birthdate: 1956/03/02 Sex: male Admission Date (Current Location): 07/21/2020  [facility] and [place] Number:  Producer, television/film/video and Address:  [facility], [address] [place], [place] [address]      Provider Number: [id number]  Attending Physician Name and Address:  McDiarmid, Leighton Roach, MD  Relative Name and Phone Number:  Meredith Mells [phone]    Current Level of Care: Hospital Recommended Level of Care: Skilled Nursing Facility Prior Approval Number:    Date Approved/Denied:   PASRR Number: PENDING  Discharge Plan: SNF    Current Diagnoses: Patient Active Problem List   Diagnosis Date Noted  . Rhabdomyolysis 07/22/2020  . Cerebral ventriculomegaly 07/22/2020  . Hemoglobin decreased 07/22/2020  . History of delirium tremons 07/22/2020  . Hypoalbuminemia due to protein-calorie malnutrition (HCC) 07/22/2020  . Tobacco use disorder 07/22/2020  . Alcohol withdrawal delirium (HCC)   . Dehydration   . Alcohol abuse with alcohol-induced mood disorder (HCC) 07/18/2018  . Depression 05/26/2018  . Need for immunization against influenza 04/25/2018  . Alcohol withdrawal (HCC) 09/01/2017  . Actinic keratosis 11/27/2016  . Macrocytic anemia 11/23/2016  . History of pulmonary embolus (PE) 11/02/2016  . Hiatal hernia 09/14/2016  . Skin cancer 08/13/2016  . Poor social situation 06/09/2013  . Tinea versicolor 06/08/2013  . 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 HTN (hypertension) 04/11/2012  . Alcohol use disorder, severe, dependence (HCC) 09/16/2006    Orientation RESPIRATION BLADDER Height & Weight     Place,Self  Normal Continent,External catheter Weight: 152 lb 12.5 oz (69.3 kg) Height:  5\' 8"  (172.7 cm)  BEHAVIORAL SYMPTOMS/MOOD NEUROLOGICAL BOWEL NUTRITION STATUS      Continent Diet (see dc summary)  AMBULATORY STATUS COMMUNICATION  OF NEEDS Skin   Limited Assist Verbally Normal                       Personal Care Assistance Level of Assistance  Bathing,Feeding,Dressing Bathing Assistance: Limited assistance Feeding assistance: Independent Dressing Assistance: Limited assistance     Functional Limitations Info  Sight,Speech,Hearing Sight Info: Adequate Hearing Info: Adequate Speech Info: Adequate    SPECIAL CARE FACTORS FREQUENCY  PT (By licensed PT),OT (By licensed OT)     PT Frequency: 5x week OT Frequency: 5x week            Contractures Contractures Info: Not present    Additional Factors Info  Code Status,Allergies,Psychotropic Code Status Info: Full Allergies Info: NKA Psychotropic Info: sertraline (ZOLOFT) tablet 200 mg daily         Current Medications (07/25/2020):  This is the current hospital active medication list Current Facility-Administered Medications  Medication Dose Route Frequency Provider Last Rate Last Admin  . acetaminophen (TYLENOL) tablet 650 mg  650 mg Oral Q6H Anderson, Chelsey L, DO   650 mg at 07/25/20 1149  . amLODipine (NORVASC) tablet 5 mg  5 mg Oral Daily 09/22/20, DO   5 mg at 07/25/20 09/22/20  . baclofen (LIORESAL) tablet 10 mg  10 mg Oral TID PRN 1517, MD      . chlordiazePOXIDE (LIBRIUM) capsule 25 mg  25 mg Oral Daily Anderson, Chelsey L, DO      . enoxaparin (LOVENOX) injection 40 mg  40 mg Subcutaneous Q24H Welborn, Ryan, DO   40 mg at 07/24/20 2025  . ferrous sulfate tablet 325 mg  325 mg Oral Once per day on Mon Thu Welborn, Ryan, DO   325 mg at [id number]  . folic acid (FOLVITE) tablet 1 mg  1 mg Oral Daily Lurline Del, DO   1 mg at 07/25/20 0825  . hydrochlorothiazide (HYDRODIURIL) tablet 25 mg  25 mg Oral Daily Lurline Del, DO   25 mg at 07/25/20 0825  . lidocaine (LIDODERM) 5 % 1 patch  1 patch Transdermal Daily PRN Ouida Sills, Chelsey L, DO      . loratadine (CLARITIN) tablet 10 mg  10 mg Oral Daily Welborn, Ryan, DO   10 mg at  07/25/20 0825  . magnesium sulfate IVPB 2 g 50 mL  2 g Intravenous Daily Lurline Del, DO 50 mL/hr at 07/25/20 0832 2 g at 07/25/20 0832  . multivitamin with minerals tablet 1 tablet  1 tablet Oral Daily Lurline Del, DO   1 tablet at 07/25/20 0825  . polyvinyl alcohol (LIQUIFILM TEARS) 1.4 % ophthalmic solution 1 drop  1 drop Both Eyes TID PRN Lurline Del, DO      . sertraline (ZOLOFT) tablet 200 mg  200 mg Oral Daily Welborn, Ryan, DO   200 mg at 07/25/20 0825  . thiamine tablet 100 mg  100 mg Oral Daily Anderson, Chelsey L, DO   100 mg at 07/25/20 0825     Discharge Medications: Please see discharge summary for a list of discharge medications.  Relevant Imaging Results:  Relevant Lab Results:   Additional Information SSN [id number]  Loreta Ave, LCSWA

## 2020-07-25 NOTE — Progress Notes (Signed)
   07/25/20 1755  Vitals  Temp 98.6 F (37 C)  Temp Source Oral  BP 109/77  MAP (mmHg) 86  BP Location Left Arm  BP Method Automatic  Patient Position (if appropriate) Lying  Pulse Rate 95  Pulse Rate Source Monitor  ECG Heart Rate (!) 103  Resp 20  Oxygen Therapy  SpO2 95 %  O2 Device Room Air   Per CCMD pt is in afib with a high HR from 100-150.  Pt is in bed watching television and eating.  Vitals as shown above. EKG completed and in pts chart. MD notified by primary nurse.  EKG shows aflutter.  Pt denies chest pain, dizziness or any other discomfort at this time.

## 2020-07-26 ENCOUNTER — Inpatient Hospital Stay (HOSPITAL_COMMUNITY): Payer: Medicaid Other

## 2020-07-26 DIAGNOSIS — Z87898 Personal history of other specified conditions: Secondary | ICD-10-CM | POA: Diagnosis not present

## 2020-07-26 DIAGNOSIS — R9431 Abnormal electrocardiogram [ECG] [EKG]: Secondary | ICD-10-CM

## 2020-07-26 DIAGNOSIS — F1020 Alcohol dependence, uncomplicated: Secondary | ICD-10-CM | POA: Diagnosis not present

## 2020-07-26 DIAGNOSIS — F10231 Alcohol dependence with withdrawal delirium: Secondary | ICD-10-CM | POA: Diagnosis not present

## 2020-07-26 LAB — BASIC METABOLIC PANEL
Anion gap: 9 (ref 5–15)
BUN: <5 mg/dL — ABNORMAL LOW (ref 8–23)
CO2: 24 mmol/L (ref 22–32)
Calcium: 7.8 mg/dL — ABNORMAL LOW (ref 8.9–10.3)
Chloride: 105 mmol/L (ref 98–111)
Creatinine, Ser: 0.51 mg/dL — ABNORMAL LOW (ref 0.61–1.24)
GFR, Estimated: >60 mL/min (ref >60)
Glucose, Bld: 88 mg/dL (ref 70–99)
Potassium: 3.7 mmol/L (ref 3.5–5.1)
Sodium: 138 mmol/L (ref 135–145)

## 2020-07-26 LAB — ECHOCARDIOGRAM COMPLETE
Area-P 1/2: 3.17 cm2
Calc EF: 55 %
Height: 68 in
S' Lateral: 2.6 cm
Single Plane A2C EF: 46.2 %
Single Plane A4C EF: 60.1 %
Weight: 2317.48 oz

## 2020-07-26 LAB — MAGNESIUM: Magnesium: 1.5 mg/dL — ABNORMAL LOW (ref 1.7–2.4)

## 2020-07-26 MED ORDER — PERFLUTREN LIPID MICROSPHERE
1.0000 mL | INTRAVENOUS | Status: AC | PRN
  Administered 2020-07-26: 2 mL via INTRAVENOUS
  Filled 2020-07-26: qty 10

## 2020-07-26 MED ORDER — COVID-19 MRNA VACC (MODERNA) 50 MCG/0.25ML IM SUSP
0.2500 mL | Freq: Once | INTRAMUSCULAR | Status: AC
  Administered 2020-07-26: 0.25 mL via INTRAMUSCULAR
  Filled 2020-07-26: qty 0.25

## 2020-07-26 MED ORDER — RIVAROXABAN 20 MG PO TABS
20.0000 mg | ORAL_TABLET | Freq: Every day | ORAL | Status: DC
  Administered 2020-07-26 – 2020-08-08 (×14): 20 mg via ORAL
  Filled 2020-07-26 (×15): qty 1

## 2020-07-26 MED ORDER — MAGNESIUM OXIDE 400 (241.3 MG) MG PO TABS
400.0000 mg | ORAL_TABLET | Freq: Two times a day (BID) | ORAL | Status: DC
Start: 2020-07-26 — End: 2020-08-09
  Administered 2020-07-26 – 2020-08-09 (×29): 400 mg via ORAL
  Filled 2020-07-26 (×29): qty 1

## 2020-07-26 NOTE — Progress Notes (Signed)
Physical Therapy Treatment Patient Details Name: Anthony [name] MRN: [id number] DOB: 03-02-1956 Today's Date: 07/26/2020    History of Present Illness Anthony [name] is a 65 y.o. male being treated for acute alcohol withdrawal . PMH is significant for alcohol use disorder with prior withdrawal and DTs, HFpEF (last Echo 2019), HTN, chronic low back pain, anxiety, depression, and h/o right pulmonary embolus.    PT Comments    Today's PT session continued to focus on mobility progression. Pt's HR stable in the 90's with mobility today. Acute PT to continue during pt's hospital stay.  Follow Up Recommendations  SNF;Supervision/Assistance - 24 hour     Equipment Recommendations  Rolling walker with 5" wheels    Precautions / Restrictions Precautions Precautions: Fall Restrictions Weight Bearing Restrictions: No    Mobility  Bed Mobility Overal bed mobility: Needs Assistance Bed Mobility: Supine to Sit     Supine to sit: Supervision     General bed mobility comments: with HOB 30 degrees, cues for technique. Once seated at edge of bed pt able to scoot self closer to edge with cues.  Transfers Overall transfer level: Needs assistance Equipment used: 1 person hand held assist;Rolling walker (2 wheeled) Transfers: Sit to/from [facility] Sit to Stand: Min assist;Min guard Stand pivot transfers: Min assist       General transfer comment: min assist for 1st stand with pt holding onto PTA. Pt then asked "are going to dance now?". Had pt take lateral step toward HOB. On 2cd stand had pt stand to RW with cues for hand placment. With RW pt took pivot steps from bed to chair with min assit, second person for safety. Pt able to scoot self back into the chair.      Balance Overall balance assessment: Needs assistance Sitting-balance support: No upper extremity supported;Feet supported Sitting balance-Leahy Scale: Good Sitting balance - Comments: pt able to sit EOB several  minutes while lines/leads adjusted and gown adjusted with no balance loss noted.   Standing balance support: Bilateral upper extremity supported Standing balance-Leahy Scale: Poor Standing balance comment: support for stability; requires external support             Cognition Arousal/Alertness: Awake/alert Behavior During Therapy: WFL for tasks assessed/performed;Impulsive Overall Cognitive Status: Impaired/Different from baseline Area of Impairment: Memory;Safety/judgement;Awareness;Problem solving                 Orientation Level: Disoriented to;Time;Situation   Memory: Decreased short-term memory Following Commands: Follows one step commands consistently;Follows multi-step commands inconsistently Safety/Judgement: Decreased awareness of safety;Decreased awareness of deficits Awareness: Emergent Problem Solving: Difficulty sequencing;Requires verbal cues;Requires tactile cues General Comments: Pt able to state he is in the hospital, unable to state why. Pt found with gown striped off, reapplied for therapy. Pt followed all instructions with cues for safety needed at times due to impulsivity.       Pertinent Vitals/Pain Pain Assessment: No/denies pain     PT Goals (current goals can now be found in the care plan section) Acute Rehab PT Goals Patient Stated Goal: did not state PT Goal Formulation: With patient Time For Goal Achievement: 08/07/20 Potential to Achieve Goals: Fair Progress towards PT goals: Progressing toward goals    Frequency    Min 3X/week      PT Plan Current plan remains appropriate    AM-PAC PT "6 Clicks" Mobility   Outcome Measure  Help needed turning from your back to your side while in a flat bed without using  bedrails?: A [place] Help needed moving from lying on your back to sitting on the side of a flat bed without using bedrails?: A [place] Help needed moving to and from a bed to a chair (including a wheelchair)?: A Bischoff Help  needed standing up from a chair using your arms (e.g., wheelchair or bedside chair)?: A [place]och Help needed to walk in hospital room?: A [place] Help needed climbing 3-5 steps with a railing? : A Lot 6 Click Score: 17    End of Session Equipment Utilized During Treatment: Gait belt Activity Tolerance: Patient tolerated treatment well;No increased pain Patient left: in chair;with call bell/phone within reach;with chair alarm set Nurse Communication: Mobility status PT Visit Diagnosis: Unsteadiness on feet (R26.81);Muscle weakness (generalized) (M62.81);History of falling (Z91.81)     Time: 6659-9357 PT Time Calculation (min) (ACUTE ONLY): 18 min  Charges:  $Therapeutic Activity: 8-22 mins                    Willow Ora, PTA, [facility] Acute [employer] [phone] 07/26/20, 3:56 PM   Willow Ora 07/26/2020, 3:56 PM

## 2020-07-26 NOTE — Progress Notes (Addendum)
Family Medicine Teaching Service Daily Progress Note Intern Pager: [phone]  Patient name: Anthony [name] Medical record number: [id number] Date of birth: [birth year] Age: 65 y.o. Gender: male  Primary Care Provider: Carollee Leitz, MD Consultants: PT/OT  Code Status: Full  Pt Overview and Major Events to Date:  Pt with a history of AUD with complicated withdrawals and DTs here for treatment of withdrawals.  Pt. Is medically stable to discharge to SNF.   Assessment and Plan: Anthony A Littleis a 65 y.o.male admitted with acute alcohol withdrawal. PMH is significant for alcohol use disorder with prior withdrawal and DTs,HFpEF (last Echo 2019),HTN, chronic low back pain, anxiety, depression, andh/oright pulmonary embolus.  Alcohol withdrawal Alcohol Use Disorder  Most recentCIWAs 0. Has not required Ativan for >48 hours.Vitals stable. Last drink 5 days ago. - D/c CIWA protocol - Librium taper, Day 5/5 - Continue Thiamine/folate daily - Daily CMP, mag - Pending SNF placement per PT/OT recs  New-Onset Paroxysmal Atrial Fibrillation Patient with a run of HR to the 150s yesterday. EKG obtained showing atrial flutter. Heart rate has been within normal limits since that time. Patient was asymptomatic at the time. Likely that this was precipitated by his month-long history of increased etOH intake. Last Echo in 2019 without structural deformity. CHADS2-VASc Score 4, placing him in the "moderate-high" risk zone. Patient does have a history of unprovoked PE in 2018. Was on Xarelto 20mg  for about 10 months. - continue tele to monitor for subsequent events. - Initiate Xarelto 20mg  with dinner - Echocardiogram today - Can d/c Lovenox ppx  Unstable Housing Situation It seems that he missed his eviction court date on Tuesday and is uncertain whether he will have a safe place to return to upon discharge from SNF.  - Will make SW aware   COVID Vaccination Patient has had two doses of COVID  Vaccine. Requests booster shot today. - Booster vaccine given - Monitor for side effects  Hypomagnesemia  Hypokalemia Mag 1.5 this am despite several days receiving IV Mag. - Transition mag to PO 400mg  BID - AM BMP+Mag - monitor for diarrhea  Chronic Low Back Pain -Tylenol 650mg  q6 -Lidocaine patch PRN -Baclofen10mg  TID prn    FEN/GI: Heart healthy  PPx: xarelto  Status is: Inpatient  Remains inpatient appropriate because:Unsafe d/c plan   Dispo: The patient is from: Home              Anticipated d/c is to: SNF              Anticipated d/c date is: 1 day              Patient currently is medically stable to d/c.  Subjective:  Patient reports feeling well this morning. He is still amenable to discharge to SNF. He is also amenable to restarting Xarelto, denies any issues on this medication when he took it from 2018-2019.  On questioning about his housing situation he states "I have an apartment" but when pressed further he acknowledges that he missed a court date on Tuesday and may not have access to the apartment when he leaves SNF.   Objective: Temp:  [97.5 F (36.4 C)-98.6 F (37 C)] 97.7 F (36.5 C) (01/07 0604) Pulse Rate:  [84-102] 84 (01/06 2339) Resp:  [16-24] 21 (01/07 0604) BP: (93-131)/(67-94) 112/87 (01/07 0604) SpO2:  [94 %-98 %] 98 % (01/07 0604) Weight:  [65.7 kg] 65.7 kg (01/07 0604) Physical Exam: General: Pleasant, sitting up in bed eating breakfast during interview  Cardiovascular: Regular rate and rhythm Respiratory: Lungs CTAB Abdomen: Non-tender, non-distended, no hepatomegaly or ascites on exam Extremities: No LE Edema  Laboratory: Recent Labs  Lab 07/21/20 1029 07/22/20 0414 07/23/20 0533  WBC 12.8* 7.4 5.6  HGB 11.5* 9.7* 11.1*  HCT 33.2* 29.8* 33.2*  PLT 276 209 203   Recent Labs  Lab 07/23/20 0533 07/24/20 0254 07/25/20 0352 07/26/20 0233  NA 138 136 137 138  K 3.4* 3.5 3.6 3.7  CL 109 107 108 105  CO2 19* 23 22 24   BUN  6* <5* <5* <5*  CREATININE 0.66 0.56* 0.49* 0.51*  CALCIUM 7.8* 7.6* 7.6* 7.8*  PROT 4.9* 4.7* 4.7*  --   BILITOT 1.3* 0.7 0.9  --   ALKPHOS 139* 138* 123  --   ALT 41 38 36  --   AST 124* 84* 60*  --   GLUCOSE 70 93 82 88    Imaging/Diagnostic Tests: EKG yesterday with new-onset Afib  Pearla Dubonnet, Medical Student 07/26/2020, 6:53 AM  RESIDENT ATTESTATION    I have seen and examined this patient.     I have discussed the findings and exam with the intern and agree with the above note, which I have edited appropriately. I helped develop the management plan that is described in the resident's note, and I agree with the content.   Doristine Mango, DO PGY-3 Family Medicine Resident

## 2020-07-26 NOTE — Progress Notes (Signed)
  Echocardiogram 2D Echocardiogram has been performed.  Anthony [name] 07/26/2020, 4:15 PM

## 2020-07-26 NOTE — Plan of Care (Signed)
  Problem: Pain Managment: Goal: General experience of comfort will improve Outcome: Completed/Met

## 2020-07-27 DIAGNOSIS — E8809 Other disorders of plasma-protein metabolism, not elsewhere classified: Secondary | ICD-10-CM | POA: Diagnosis not present

## 2020-07-27 DIAGNOSIS — F1020 Alcohol dependence, uncomplicated: Secondary | ICD-10-CM | POA: Diagnosis not present

## 2020-07-27 DIAGNOSIS — Z87898 Personal history of other specified conditions: Secondary | ICD-10-CM | POA: Diagnosis not present

## 2020-07-27 DIAGNOSIS — D539 Nutritional anemia, unspecified: Secondary | ICD-10-CM | POA: Diagnosis not present

## 2020-07-27 LAB — GLUCOSE, CAPILLARY: Glucose-Capillary: 78 mg/dL (ref 70–99)

## 2020-07-27 LAB — MAGNESIUM: Magnesium: 1.4 mg/dL — ABNORMAL LOW (ref 1.7–2.4)

## 2020-07-27 NOTE — Progress Notes (Signed)
Patient had witnessed fall attempting to ambulate back from bathroom. No obvious signs of injury. Patient denies distress. MD notified. Safety zone created.

## 2020-07-27 NOTE — Progress Notes (Addendum)
Family Medicine Teaching Service Daily Progress Note Intern Pager: [phone]  Patient name: Anthony [name] Mittleman Medical record number: [id number] Date of birth: Mar 14, 1956 Age: 65 y.o. Gender: male  Primary Care Provider: Carollee Leitz, MD Consultants: none  Code Status: Full Code   Pt Overview and Major Events to Date:  Hospital Day: 7 07/21/2020: admitted for AMS  1/3-1/7: Librium taper   Assessment and Plan: Ashur A Bogart is a 65 y.o. male who presented w/ acute ETOH withdrawal. PMHx s/f AUD with prior DTs, HFpEF, HTN, chronic low back pain, anxiety, depression, and h/o right PE.  Alcohol withdrawal, resolved  Alcohol Use Disorder Mental status at baseline. S/p Librium taper. Patient is medically stable for discharge to SNF when bed is available.  - continue daily thiamine,  - TOC on board for dispo to SNF   Difficulty with Mobility  Evaluated by PT and OT who is recommending SNF for rehab. Patient requesting to walk and get out of bed more.  - continue PT/OT  - ambulate q shift  - will check vitamin B12, folate   New onset Afib on EKG (this admission)  Hx of PE Started on xarelto this admission (07/26/20). Patient tolerating well  - Continue daily xarelto   Hypertension  BP stables   - continue home HCTZ & amlodipine 5 mg   Hypomagnesemia  Magnesium 1.5. no diarrhea.   - continue daily MgOx 400 mg BID   Macrocytic Anemia  Likely in the seting of ETOH abuse. 07/23/20  Hgb 11.1, MCV 110.3 - checking B12 and folate as above   Depression & Anxiety  - continue home sertraline 200 mg   Iron Deficiency  - Continue home Iron Mon & Thursday   Allergies  - continue home zyrtec daily   Chronic low back pain  - scheduled acetaminophen q6 hours  - baclofen & lidocaine patch PRN   FEN/GI:  . Fluids: none  . Electrolytes: replete PRN   . Nutrition: Regular  VTE prophylaxis: Chronically Anticoagulated   Status is: Inpatient  Remains inpatient appropriate because:Unsafe d/c  plan   Dispo: The patient is from: Home              Anticipated d/c is to: SNF              Anticipated d/c date is: 1 day              Patient currently is medically stable to d/c.   Subjective:  NAEO.   Objective: Temp:  [97.6 F (36.4 C)-98.2 F (36.8 C)] 97.7 F (36.5 C) (01/08 2039) Pulse Rate:  [85-100] 97 (01/08 1900) Cardiac Rhythm: Normal sinus rhythm (01/08 0751) Resp:  [15-22] 22 (01/08 1900) BP: (96-127)/(74-88) 96/74 (01/08 2039) SpO2:  [96 %-99 %] 97 % (01/08 1900) Weight:  [64.3 kg] 64.3 kg (01/08 0327) Intake/Output      01/08 0701 01/09 0700   P.O. 600   Total Intake(mL/kg) 600 (9.3)   Urine (mL/kg/hr) 925 (1)   Stool 0   Total Output 925   Net -325       Stool Occurrence 1 x       Physical Exam: General: NAD, non-toxic, well-appearing, sitting comfortably in bed   HEENT: [place]/AT. PERRLA. EOMI.  Cardiovascular: RRR, normal S1, S2. B/L 2+ RP. No BLEE Respiratory: CTAB. No IWOB.  Abdomen: + BS. NT, ND, soft to palpation.  Extremities: Warm and well perfused. Moving spontaneously. No asterixis. Integumentary: No obvious rashes, lesions, trauma on general  exam. Neuro: A & O x3. CN grossly intact. No FND  Laboratory: I have personally read and reviewed all labs and imaging studies.  CBC: Recent Labs  Lab 07/21/20 1029 07/22/20 0414 07/23/20 0533  WBC 12.8* 7.4 5.6  NEUTROABS 11.3*  --   --   HGB 11.5* 9.7* 11.1*  HCT 33.2* 29.8* 33.2*  MCV 108.1* 110.8* 110.3*  PLT 276 209 203   CMP: Recent Labs  Lab 07/21/20 1029 07/21/20 1141 07/23/20 0533 07/24/20 0254 07/25/20 0352 07/26/20 0233 07/27/20 0338  NA  --    < > 138 136 137 138  --   K  --    < > 3.4* 3.5 3.6 3.7  --   CL  --    < > 109 107 108 105  --   CO2  --    < > 19* 23 22 24   --   GLUCOSE  --    < > 70 93 82 88  --   BUN  --    < > 6* <5* <5* <5*  --   CREATININE  --    < > 0.66 0.56* 0.49* 0.51*  --   CALCIUM  --    < > 7.8* 7.6* 7.6* 7.8*  --   MG 1.5*  --  1.5* 1.4*  1.5* 1.5* 1.4*  PHOS 4.4  --  2.7  --  2.5  --   --   ALBUMIN  --    < > 1.9* 1.7* 1.7*  --   --    < > = values in this interval not displayed.   CBG: Recent Labs  Lab 07/21/20 1145 07/21/20 1252 07/27/20 0643  GLUCAP 68* 102* 78   Micro: Covid Negative 07/21/20   Imaging/Diagnostic Tests: ECHOCARDIOGRAM COMPLETE  Result Date: 07/26/2020    ECHOCARDIOGRAM REPORT   Patient Name:   Grayton A Capuano Date of Exam: 07/26/2020 Medical Rec #:  [id number]    Height:       68.0 in Accession #:    [id number]   Weight:       144.8 lb Date of Birth:  1956/01/31    BSA:          1.782 m Patient Age:    83 years     BP:           110/78 mmHg Patient Gender: M            HR:           80 bpm. Exam Location:  Inpatient Procedure: 2D Echo, Cardiac Doppler, Color Doppler and Intracardiac            Opacification Agent Indications:    R94.31 Abnormal EKG; I48.91* Unspeicified atrial fibrillation  History:        Patient has prior history of Echocardiogram examinations, most                 recent 09/03/2017. Risk Factors:Hypertension and Current Smoker.                 ETOH. Delirium.  Sonographer:    Roseanna Rainbow RDCS Referring Phys: [phone] [facility]  Sonographer Comments: Technically difficult study due to poor echo windows, suboptimal parasternal window, suboptimal apical window, suboptimal subcostal window and Technically challenging study due to limited acoustic windows. Poor quality images. Patient sensitive to pressure of probe. Definity attempted. Exam delayed due to patient being in chair and having to make bed before moving patient. IMPRESSIONS  1. Left ventricular ejection fraction, by estimation, is 55 to 60%. The left ventricle has normal function. The left ventricle has no regional wall motion abnormalities. Left ventricular diastolic parameters are consistent with Grade II diastolic dysfunction (pseudonormalization).  2. Right ventricular systolic function is normal. The right ventricular size is normal.  Tricuspid regurgitation signal is inadequate for assessing PA pressure.  3. The mitral valve is normal in structure. Trivial mitral valve regurgitation. No evidence of mitral stenosis.  4. The aortic valve is tricuspid. Aortic valve regurgitation is not visualized. No aortic stenosis is present.  5. Aortic dilatation noted. There is mild dilatation of the aortic root, measuring 39 mm.  6. The inferior vena cava is dilated in size with >50% respiratory variability, suggesting right atrial pressure of 8 mmHg. FINDINGS  Left Ventricle: Left ventricular ejection fraction, by estimation, is 55 to 60%. The left ventricle has normal function. The left ventricle has no regional wall motion abnormalities. Definity contrast agent was given IV to delineate the left ventricular  endocardial borders. The left ventricular internal cavity size was normal in size. There is no left ventricular hypertrophy. Left ventricular diastolic parameters are consistent with Grade II diastolic dysfunction (pseudonormalization). Right Ventricle: The right ventricular size is normal. No increase in right ventricular wall thickness. Right ventricular systolic function is normal. Tricuspid regurgitation signal is inadequate for assessing PA pressure. Left Atrium: Left atrial size was normal in size. Right Atrium: Right atrial size was normal in size. Pericardium: There is no evidence of pericardial effusion. Mitral Valve: The mitral valve is normal in structure. Trivial mitral valve regurgitation. No evidence of mitral valve stenosis. Tricuspid Valve: The tricuspid valve is normal in structure. Tricuspid valve regurgitation is not demonstrated. Aortic Valve: The aortic valve is tricuspid. Aortic valve regurgitation is not visualized. No aortic stenosis is present. Pulmonic Valve: The pulmonic valve was normal in structure. Pulmonic valve regurgitation is not visualized. Aorta: Aortic dilatation noted. There is mild dilatation of the aortic root,  measuring 39 mm. Venous: The inferior vena cava is dilated in size with greater than 50% respiratory variability, suggesting right atrial pressure of 8 mmHg. IAS/Shunts: No atrial level shunt detected by color flow Doppler.  LEFT VENTRICLE PLAX 2D LVIDd:         3.70 cm     Diastology LVIDs:         2.60 cm     LV e' medial:    7.94 cm/s LV PW:         1.20 cm     LV E/e' medial:  8.6 LV IVS:        1.20 cm     LV e' lateral:   9.03 cm/s LVOT diam:     2.10 cm     LV E/e' lateral: 7.6 LV SV:         48 LV SV Index:   27 LVOT Area:     3.46 cm  LV Volumes (MOD) LV vol d, MOD A2C: 89.4 ml LV vol d, MOD A4C: 93.5 ml LV vol s, MOD A2C: 48.1 ml LV vol s, MOD A4C: 37.3 ml LV SV MOD A2C:     41.3 ml LV SV MOD A4C:     93.5 ml LV SV MOD BP:      51.6 ml RIGHT VENTRICLE             IVC RV S prime:     14.40 cm/s  IVC diam: 2.20 cm TAPSE (M-mode): 2.8  cm LEFT ATRIUM             Index       RIGHT ATRIUM           Index LA diam:        3.80 cm 2.13 cm/m  RA Area:     11.40 cm LA Vol (A2C):   26.3 ml 14.76 ml/m RA Volume:   22.40 ml  12.57 ml/m LA Vol (A4C):   19.5 ml 10.94 ml/m LA Biplane Vol: 22.3 ml 12.52 ml/m  AORTIC VALVE LVOT Vmax:   67.20 cm/s LVOT Vmean:  49.300 cm/s LVOT VTI:    0.138 m MITRAL VALVE MV Area (PHT): 3.17 cm    SHUNTS MV Decel Time: 239 msec    Systemic VTI:  0.14 m MV E velocity: 68.60 cm/s  Systemic Diam: 2.10 cm MV A velocity: 54.20 cm/s MV E/A ratio:  1.27 Loralie Champagne MD Electronically signed by Loralie Champagne MD Signature Date/Time: 07/26/2020/5:51:05 PM    Final    Procedures:   Procedure Orders     EKG 12-Lead     EKG     EKG 12-Lead     EKG 12-Lead     ECHOCARDIOGRAM COMPLETE  Wilber Oliphant, MD 07/27/2020, 9:57 PM PGY-3, [place] Intern pager: [phone], text pages welcome

## 2020-07-27 NOTE — Progress Notes (Addendum)
Family Medicine Teaching Service Daily Progress Note Intern Pager: [phone]  Patient name: Anthony [name] Medical record number: [id number] Date of birth: 02-13-1956 Age: 65 y.o. Gender: male  Primary Care Provider: Carollee Leitz, MD Consultants: PT/OT Code Status: Full  Pt Overview and Major Events to Date:  Pt with a history of AUD with complicated withdrawals and DTs here for treatment of withdrawals.  Pt. Is medically stable to discharge to SNF.  Assessment and Plan: Anthony [name] a 65 y.o.male admitted with acute alcohol withdrawal. PMH is significant for alcohol use disorder with prior withdrawal and DTs,HFpEF (last Echo 2019),HTN, chronic low back pain, anxiety, depression, andh/oright pulmonary embolus.  Alcohol withdrawal Alcohol Use Disorder  Last drink 6 days ago. Finished Librium taper yesterday. Feeling well today.  - Continue Thiamine/folate daily -Pending SNF placement per PT/OT recs  Witnessed Fall on Blood Thinners Received a page from nursing staff around 0830 that patient had a witnessed fall when ambulating to the bathroom this morning. He is on Xarelto for AFib. He did not hit his head and had no LOC. He has a normal neuro exam and no scalp deformities or tenderness on exam.  -  No further evaluation or intervention at this time  Hypomagnesemia Mag 1.4 this am. S/p MagOx 400mg  yesterday - MagOx 400mg  BID PO  New-Onset Paroxysmal Atrial Fibrillation Patient doesn't report any symptomatics events since yesterday. Echo yesterday was essentially unchanged from 2 years ago. EF 55-60%, Grade II diastolic dysfunction.  - continue tele to monitor for subsequent events. - Continue Xarelto 20mg  daily with dinner  COVID Vaccination Received COVID Booster yesterday. Vitals have remained stable since. He reports no chills, myalgias, or headache.  FEN/GI: Heart Healthy PPx: on Xarelto   Status is: Inpatient  Remains inpatient appropriate because:Unsafe  d/c plan   Dispo: The patient is from: Home              Anticipated d/c is to: SNF              Anticipated d/c date is: 1 day              Patient currently is medically stable to d/c.    Subjective:  Mr. Anthony [name] says that he feels "fine" this morning. He had no acute events in the past 24 hours and denies any adverse effects after receiving his COVID booster shot yesterday.   Objective: Temp:  [97.6 F (36.4 C)-98.2 F (36.8 C)] 97.6 F (36.4 C) (01/08 0630) Pulse Rate:  [86-94] 88 (01/08 0330) Resp:  [14-24] 15 (01/08 0330) BP: (103-125)/(77-89) 125/82 (01/08 0630) SpO2:  [95 %-99 %] 98 % (01/08 0630) Weight:  [64.3 kg] 64.3 kg (01/08 0327) Physical Exam: General: Alert, sitting up in bed eating breakfast Cardiovascular: RRR, no m/r/g Respiratory: Lungs CTAB Abdomen: non-tender, non-distended, no hepatosplenomegaly or ascites Extremities: No LE edema HEENT: Head atraumatic, without deformity or scalp tenderness Neuro: CN II-XII intact, strength 5/5 throughout Mental status: Unchanged from previous   Laboratory: Recent Labs  Lab 07/21/20 1029 07/22/20 0414 07/23/20 0533  WBC 12.8* 7.4 5.6  HGB 11.5* 9.7* 11.1*  HCT 33.2* 29.8* 33.2*  PLT 276 209 203   Recent Labs  Lab 07/23/20 0533 07/24/20 0254 07/25/20 0352 07/26/20 0233  NA 138 136 137 138  K 3.4* 3.5 3.6 3.7  CL 109 107 108 105  CO2 19* 23 22 24   BUN 6* <5* <5* <5*  CREATININE 0.66 0.56* 0.49* 0.51*  CALCIUM 7.8* 7.6* 7.6*  7.8*  PROT 4.9* 4.7* 4.7*  --   BILITOT 1.3* 0.7 0.9  --   ALKPHOS 139* 138* 123  --   ALT 41 38 36  --   AST 124* 84* 60*  --   GLUCOSE 70 93 82 88    Imaging/Diagnostic Tests: Echo yesterday essentially unchanged from last study ~2 yrs ago  Anthony [name], Medical Student 07/27/2020, 6:51 AM  Upper-level attestation: I agree with the above documentation of student Dr. Joelyn Oms.  Patient did have a witnessed fall earlier today when trying to ambulate to use the restroom.   He was evaluated after this fall and was not found to have any signs of injury or other complaints.  Currently awaiting SNF placement.  We are anticoagulating him with Xarelto due to his paroxysmal A. Fib  General: Alert and oriented in no apparent distress Heart: Regular rate and rhythm with no murmurs appreciated Lungs: CTA bilaterally, no wheezing Abdomen: Bowel sounds present, no abdominal pain Skin: Warm and dry

## 2020-07-28 DIAGNOSIS — E860 Dehydration: Secondary | ICD-10-CM | POA: Diagnosis not present

## 2020-07-28 DIAGNOSIS — F10231 Alcohol dependence with withdrawal delirium: Secondary | ICD-10-CM | POA: Diagnosis not present

## 2020-07-28 DIAGNOSIS — F10232 Alcohol dependence with withdrawal with perceptual disturbance: Secondary | ICD-10-CM | POA: Diagnosis not present

## 2020-07-28 DIAGNOSIS — F1020 Alcohol dependence, uncomplicated: Secondary | ICD-10-CM | POA: Diagnosis not present

## 2020-07-28 LAB — BASIC METABOLIC PANEL
Anion gap: 7 (ref 5–15)
BUN: 8 mg/dL (ref 8–23)
CO2: 28 mmol/L (ref 22–32)
Calcium: 8.3 mg/dL — ABNORMAL LOW (ref 8.9–10.3)
Chloride: 102 mmol/L (ref 98–111)
Creatinine, Ser: 0.56 mg/dL — ABNORMAL LOW (ref 0.61–1.24)
GFR, Estimated: >60 mL/min (ref >60)
Glucose, Bld: 87 mg/dL (ref 70–99)
Potassium: 3.9 mmol/L (ref 3.5–5.1)
Sodium: 137 mmol/L (ref 135–145)

## 2020-07-28 LAB — VITAMIN B12: Vitamin B-12: 541 pg/mL (ref 180–914)

## 2020-07-28 LAB — MAGNESIUM: Magnesium: 1.5 mg/dL — ABNORMAL LOW (ref 1.7–2.4)

## 2020-07-28 LAB — FOLATE: Folate: 10.8 ng/mL (ref >5.9)

## 2020-07-28 NOTE — Plan of Care (Signed)
  Problem: Elimination: Goal: Will not experience complications related to bowel motility Outcome: Completed/Met Goal: Will not experience complications related to urinary retention Outcome: Completed/Met

## 2020-07-28 NOTE — Plan of Care (Signed)
  Problem: Clinical Measurements: Goal: Will remain free from infection Outcome: Completed/Met Goal: Respiratory complications will improve Outcome: Completed/Met   Problem: Skin Integrity: Goal: Risk for impaired skin integrity will decrease Outcome: Completed/Met

## 2020-07-29 DIAGNOSIS — F1020 Alcohol dependence, uncomplicated: Secondary | ICD-10-CM | POA: Diagnosis not present

## 2020-07-29 DIAGNOSIS — E860 Dehydration: Secondary | ICD-10-CM | POA: Diagnosis not present

## 2020-07-29 DIAGNOSIS — Z659 Problem related to unspecified psychosocial circumstances: Secondary | ICD-10-CM | POA: Diagnosis not present

## 2020-07-29 DIAGNOSIS — F10231 Alcohol dependence with withdrawal delirium: Secondary | ICD-10-CM | POA: Diagnosis not present

## 2020-07-29 MED ORDER — METOPROLOL TARTRATE 12.5 MG HALF TABLET
12.5000 mg | ORAL_TABLET | Freq: Two times a day (BID) | ORAL | Status: DC
Start: 2020-07-29 — End: 2020-08-07
  Administered 2020-07-29 – 2020-08-06 (×16): 12.5 mg via ORAL
  Filled 2020-07-29 (×19): qty 1

## 2020-07-29 NOTE — Discharge Instructions (Addendum)
Dear Anthony [name],   Thank you so much for allowing [place] to be part of your care!  You were admitted to [facility] for alcohol withdrawal.  You were also found to have an abnormal heart rhythm called atrial fibrillation.  You were started on a blood thinner called rivaroxaban because you have an increased risk for blood clots due to atrial fibrillation.  We also started you on a medication called metoprolol to control your fast heartbeat.   POST-HOSPITAL & CARE INSTRUCTIONS 1. We recommend you stop drinking alcohol. Below are some treatment programs. 2. It is important to take all your medications, especially the rivaroxaban. 3. We have made you a follow-up appointment with your PCP Dr. Volanda Napoleon on Monday, January 31st at 8:30 AM. Please arrive 15 minutes prior to your appointment. If you need to reschedule your appointment, please call the clinic [phone]). 4. Please let PCP/Specialists know of any changes that were made.  5. Please see medications section of this packet for any medication changes.   DOCTOR'S APPOINTMENT & FOLLOW UP CARE INSTRUCTIONS  Future Appointments  Date Time Provider [place]  08/19/2020  8:30 AM Carollee Leitz, MD [facility] [place]    RETURN PRECAUTIONS: Please return if you develop severe shortness of breath, chest pain, or palpitations.  Take care and be well!  [facility]  [place], [place] [address] [phone]                  Intensive Outpatient Programs  [place]    [facility] [address]     [place] [address] [place],  [place], [place]      [place]  (Inpatient and outpatient)  [phone] (Suboxone and Methadone) [address]           [phone]           [facility]: Alcohol & Drug Services    [facility] - Intensive Outpatient [address]     [address] [place], [place] [address]     [place], [place]      [phone]  [facility] [place] (Outpatient, Inpatient, [place]  [facility] (Groups and [facility]idental) (insurance only) [phone]    [place], [place]          [phone]       [facility]    [facility] (for caregivers and family) [address]     [address] [place], [place], [place]      [phone]  Residential Treatment Programs  [place]  [place](2 years) Residential: 93 days)  [facility] ([place], [place], [place] [phone]      [phone] or [phone]  [facility] [place]    [facility] [address]      [address] [place], [place], [place]      [phone]  [place]   Residential Treatment Services (RTS) [place]     [address] [place], [place] [address]     [place],  [place] [phone]      [phone] Admissions: 8am-3pm M-F  [facility] Program: Residential Program [phone] Days)              [facility]: [name], [place], [place] or [phone]    (Walk in Hours over the weekend or by referral)   [place] Crisis: [facility]:[phone] (for crisis response 24 hours a day)  Information on my medicine - XARELTO (Rivaroxaban)  This medication education was reviewed with me or my healthcare representative as part of my discharge preparation.   Why was Xarelto prescribed for you? Xarelto was prescribed for you to reduce the risk of a blood clot forming that can cause a stroke if you have a medical condition called atrial fibrillation (a type of irregular heartbeat).  What do you need to know about  xarelto ? Take your Xarelto ONCE DAILY at the same time every day with your evening meal. If you have difficulty swallowing the tablet whole, you may crush it and mix in applesauce just prior to taking your dose.  Take Xarelto exactly as prescribed by your doctor and DO NOT stop taking Xarelto without talking to the doctor who prescribed the medication.  Stopping without other stroke prevention medication to take the place of Xarelto may increase your risk of developing a clot that causes a stroke.  Refill your prescription before you run out.  After discharge, you should have regular check-up appointments with your healthcare provider that is prescribing your Xarelto.  In the future your dose may need to be changed if your kidney function or weight changes by a significant amount.  What do you do if you miss a dose? If you are taking Xarelto ONCE DAILY and you miss a dose, take it as soon as you remember on the same day then continue your regularly scheduled once daily regimen the next day. Do not take two doses of Xarelto at the same time or on the same day.   Important Safety Information A possible side effect of Xarelto is bleeding. You should call your healthcare provider right away if you experience any of the following: ? Bleeding from an injury or your nose that does not stop. ? Unusual colored urine (red or dark brown) or unusual colored stools (red or black). ? Unusual bruising for unknown reasons. ? A serious fall or if you hit your head (even if there is no bleeding).  Some medicines may interact with Xarelto and might increase your risk of bleeding while on Xarelto. To help avoid this, consult your healthcare provider or pharmacist prior to using any new prescription or non-prescription medications, including herbals, vitamins, non-steroidal anti-inflammatory drugs (NSAIDs) and supplements.  This website has more information on Xarelto: [email or web address]

## 2020-07-29 NOTE — Progress Notes (Signed)
Physical Therapy Treatment Patient Details Name: Anthony [name] MRN: [id number] DOB: Nov 28, 1955 Today's Date: 07/29/2020    History of Present Illness Anthony [name] is a 65 y.o. male being treated for acute alcohol withdrawal . PMH is significant for alcohol use disorder with prior withdrawal and DTs, HFpEF (last Echo 2019), HTN, chronic low back pain, anxiety, depression, and h/o right pulmonary embolus.    PT Comments    Pt progressing towards his physical therapy goals. Ambulating 140 feet with a walker and min assist (+2 chair follow). Continues as a high fall risk based on decreased gait speed and poor safety awareness. Pt displays generalized weakness, cognitive impairments, abnormal posture, and balance deficits. Continue to recommend SNF for ongoing Physical Therapy.      Follow Up Recommendations  SNF;Supervision/Assistance - 24 hour     Equipment Recommendations  Rolling walker with 5" wheels    Recommendations for Other Services       Precautions / Restrictions Precautions Precautions: Fall Restrictions Weight Bearing Restrictions: No    Mobility  Bed Mobility Overal bed mobility: Needs Assistance Bed Mobility: Supine to Sit     Supine to sit: Supervision     General bed mobility comments: Supervision for safety. Multimodal cues for scooting to edge of bed  Transfers Overall transfer level: Needs assistance Equipment used: Rolling walker (2 wheeled) Transfers: Sit to/from Stand Sit to Stand: Min guard         General transfer comment: Min guard to rise to stand from edge of bed  Ambulation/Gait Ambulation/Gait assistance: Min assist;+2 safety/equipment Gait Distance (Feet): 140 Feet Assistive device: Rolling walker (2 wheeled) Gait Pattern/deviations: Step-through pattern;Decreased stride length;Trunk flexed;Shuffle Gait velocity: decreased   General Gait Details: [place] cues for walker positioning/proximity, minA for balance and to steer walker. Pt with  increased trunk flexion, stating, "I bend over when I walk."   Stairs             Wheelchair Mobility    Modified Rankin (Stroke Patients Only)       Balance Overall balance assessment: Needs assistance Sitting-balance support: No upper extremity supported;Feet supported Sitting balance-Leahy Scale: Good     Standing balance support: Bilateral upper extremity supported Standing balance-Leahy Scale: Poor Standing balance comment: reliant on external support                            Cognition Arousal/Alertness: Awake/alert Behavior During Therapy: WFL for tasks assessed/performed;Impulsive Overall Cognitive Status: Impaired/Different from baseline Area of Impairment: Memory;Safety/judgement;Awareness;Problem solving                 Orientation Level: Time;Disoriented to   Memory: Decreased short-term memory Following Commands: Follows one step commands consistently;Follows multi-step commands inconsistently Safety/Judgement: Decreased awareness of safety;Decreased awareness of deficits Awareness: Emergent Problem Solving: Difficulty sequencing;Requires verbal cues;Requires tactile cues General Comments: Pt stating, "I feel weak." Not oriented to day of week, stating it was Sunday. Decreased awareness of safety/deficits      Exercises      General Comments        Pertinent Vitals/Pain Pain Assessment: No/denies pain    Home Living                      Prior Function            PT Goals (current goals can now be found in the care plan section) Acute Rehab PT Goals Patient Stated Goal: "get  stronger." Potential to Achieve Goals: Fair Progress towards PT goals: Progressing toward goals    Frequency    Min 2X/week      PT Plan Frequency needs to be updated    Co-evaluation              AM-PAC PT "6 Clicks" Mobility   Outcome Measure  Help needed turning from your back to your side while in a flat bed without  using bedrails?: None Help needed moving from lying on your back to sitting on the side of a flat bed without using bedrails?: None Help needed moving to and from a bed to a chair (including a wheelchair)?: A Dark Help needed standing up from a chair using your arms (e.g., wheelchair or bedside chair)?: A Nieves Help needed to walk in hospital room?: A Pincus Help needed climbing 3-5 steps with a railing? : A Lot 6 Click Score: 19    End of Session Equipment Utilized During Treatment: Gait belt Activity Tolerance: Patient tolerated treatment well Patient left: in chair;with call bell/phone within reach;with chair alarm set Nurse Communication: Mobility status PT Visit Diagnosis: Unsteadiness on feet (R26.81);Muscle weakness (generalized) (M62.81);History of falling (Z91.81)     Time: 1062-6948 PT Time Calculation (min) (ACUTE ONLY): 19 min  Charges:  $Gait Training: 8-22 mins                     Wyona Almas, PT, DPT Acute Rehabilitation Services Pager [phone] Office [phone]    Deno Etienne 07/29/2020, 12:09 PM

## 2020-07-29 NOTE — Progress Notes (Signed)
[facility] is reviewing [place] is reviewing

## 2020-07-29 NOTE — Progress Notes (Addendum)
Family Medicine Teaching Service Daily Progress Note Intern Pager: [phone]  Patient name: Anthony [name] Medical record number: [id number] Date of birth: [birth year] Age: 65 y.o. Gender: male  Primary Care Provider: Carollee Leitz, MD Consultants:   Code Status: Full  Pt Overview and Major Events to Date:  Hospital Day 8  Assessment and Plan: Anthony [name] is a 65 y.o. male who presented w/ acute ETOH withdrawal. PMHx s/f AUD with prior DTs, HFpEF, HTN, chronic low back pain, anxiety, depression, and h/o right PE.  Alcohol withdrawal, resolved  Alcohol Use Disorder Mental status at baseline. Patient is medically stable for discharge to SNF when bed is available.  - Continue daily thiamine - TOC on board for dispo to SNF   Difficulty with Mobility  Evaluated by PT and OT who is recommending SNF for rehab. Patient requesting to walk and get out of bed more. B12 and Folate levels wnl.  - continue PT/OT  - ambulate q shift   New onset Afib on EKG (this admission)  Hx of PE Started on xarelto this admission (07/26/20). Patient tolerating well. Heart rates have been intermittently into the 100s-110s but no recurrence of AFib on tele monitor.  - Will initiate rate control with metoprolol tartrate 12.5mg  BID. - Ok to discontinue tele monitoring - Continue daily xarelto   Hypertension  BPs stable   - continue home HCTZ & amlodipine 5 mg   Hypomagnesemia  Magnesium yesterday 1.5. no diarrhea.    - continue daily MgOx 400 mg BID   Macrocytic Anemia  Likely chronic in the seting of ETOH abuse. 07/23/20  Hgb 11.1, MCV 110.3. B12 and Folate wnl.  - No further intervention at this time  Depression & Anxiety  - continue home sertraline 200 mg   Iron Deficiency  - Continue home Iron Mon & Thursday   Allergies  - continue home zyrtec daily   Chronic low back pain  - scheduled acetaminophen q6 hours  - baclofen & lidocaine patch PRN    Status is: Inpatient  Remains  inpatient appropriate because:Unsafe d/c plan   Dispo:  Patient From: Home  Planned Disposition: [place]  Expected discharge date: 07/29/2020  Medically stable for discharge: Yes   Subjective:  Patient sitting up in bed eating breakfast during interview. Pleasant and conversant this morning. No complaints, other than that he feels weak and would like more opportunities to get out of bed.   Objective: Temp:  [97.9 F (36.6 C)-99.1 F (37.3 C)] 98.6 F (37 C) (01/10 0400) Pulse Rate:  [96-117] 97 (01/10 0400) Resp:  [18-22] 18 (01/10 0400) BP: (106-132)/(76-93) 106/76 (01/10 0400) SpO2:  [96 %-100 %] 96 % (01/10 0400) Weight:  [62.7 kg] 62.7 kg (01/10 0400) Physical Exam: General: Appears comfortable, no acute distress Cardiovascular: RRR, no m/r/g Respiratory: Lungs CTAB  Abdomen: Soft, non-tender, non-distended, no hepatomegaly or ascites Extremities: Warm and well-perfused without LE edema  Laboratory: Recent Labs  Lab 07/23/20 0533  WBC 5.6  HGB 11.1*  HCT 33.2*  PLT 203   Recent Labs  Lab 07/23/20 0533 07/24/20 0254 07/25/20 0352 07/26/20 0233 07/28/20 0241  NA 138 136 137 138 137  K 3.4* 3.5 3.6 3.7 3.9  CL 109 107 108 105 102  CO2 19* 23 22 24 28   BUN 6* <5* <5* <5* 8  CREATININE 0.66 0.56* 0.49* 0.51* 0.56*  CALCIUM 7.8* 7.6* 7.6* 7.8* 8.3*  PROT 4.9* 4.7* 4.7*  --   --   BILITOT  1.3* 0.7 0.9  --   --   ALKPHOS 139* 138* 123  --   --   ALT 41 38 36  --   --   AST 124* 84* 60*  --   --   GLUCOSE 70 93 82 88 87    Imaging/Diagnostic Tests: No new imaging or diagnostic studies   Pearla Dubonnet, Medical Student 07/29/2020, 6:51 AM   Resident attestation: I agree with the above documentation of student Dr. Joelyn Oms.  I have performed the physical exam on this patient and agree with his documented physical exam above.  Patient medically stable for SNF.  We will initiate rate control with metoprolol tartrate 12.5 mg twice daily for his  intermittent tachycardia.  Lurline Del, DO'

## 2020-07-30 DIAGNOSIS — E860 Dehydration: Secondary | ICD-10-CM | POA: Diagnosis not present

## 2020-07-30 DIAGNOSIS — E8809 Other disorders of plasma-protein metabolism, not elsewhere classified: Secondary | ICD-10-CM | POA: Diagnosis not present

## 2020-07-30 DIAGNOSIS — F1020 Alcohol dependence, uncomplicated: Secondary | ICD-10-CM | POA: Diagnosis not present

## 2020-07-30 DIAGNOSIS — F10231 Alcohol dependence with withdrawal delirium: Secondary | ICD-10-CM | POA: Diagnosis not present

## 2020-07-30 NOTE — TOC Progression Note (Signed)
Transition of Care [facility]) - Progression Note    Patient Details  Name: Anthony [name] MRN: [id number] Date of Birth: [birth year]  Transition of Care [facility]) CM/SW [place], [place] Phone Number: 07/30/2020, 11:35 AM  Clinical Narrative:     CSW spoke with Ebony Hail at [facility]. They are still reviewing; will provide decision to CSW by end of day.   Expected Discharge Plan: [place] Barriers to Discharge: Continued Medical Work up  Expected Discharge Plan and Services Expected Discharge Plan: [place] arrangements for the past 2 months: Apartment                                       Social Determinants of Health (SDOH) Interventions    Readmission Risk Interventions No flowsheet data found.

## 2020-07-30 NOTE — Progress Notes (Signed)
Family Medicine Teaching Service Daily Progress Note Intern Pager: [phone]  Patient name: Anthony [name] [place] Medical record number: [id number] Date of birth: Jun 05, 1956 Age: 65 y.o. Gender: male  Primary Care Provider: Carollee Leitz, MD Consultants: none   Code Status: Full Code  Pt Overview and Major Events to Date:  1/2 Admitted  Assessment and Plan: Mylo A Trombetta is a 65 y.o. male who presented with acute alcohol withdrawal. PMHx significant for: AUD with history of DTs, HFpEF, HTN, chronic back pain, anxiety, depression, right PE. Medically stable for discharge to SNF when bed available.  Alcohol withdrawal Resolved, s/p chlordiazepoxide taper. Mental status at baseline. Medically stable for discharge. - daily thiamine - appreciate TOC assistance with placement  A Fib New onset this admission. Started on anticoagulation this admission (1/7). Rate control initiated yesterday, rate well-controlled. - continue rivaroxaban 20 mg - continue metoprolol tartrate 12.5 mg BID  HTN BP normotensive to soft, 91/72 this AM. BB started yesterday. - continue home HCTZ 25 mg daily - discontinue amlodipine to allow for rate control  Hypomagnesemia - magnesium oxide 400 mg BID  Other problems chronic and stable: Macrocytic anemia Depression/anxiety Iron deficiency Allergic rhinitis Chronic low back pain  FEN/GI: regular diet PPx: rivaroxaban  Disposition: med-surg, SNF when bed available  Subjective:  NAOE. No concerns this AM. Eyes feeling dry, asking for eye drops. He already has eyedrops (liquifilm tears) ordered, discussed with RN to offer eye drops.  Objective: Temp:  [97.6 F (36.4 C)-98.3 F (36.8 C)] 97.6 F (36.4 C) (01/11 0410) Pulse Rate:  [71-82] 71 (01/11 0410) Resp:  [15-16] 15 (01/11 0410) BP: (96-106)/(72-80) 106/80 (01/11 0410) SpO2:  [99 %-100 %] 99 % (01/11 0410) Weight:  [61.5 kg] 61.5 kg (01/11 0001) Physical Exam: General: Older male, resting comfortably in  bed, NAD Cardiovascular: Irregularly irregular, normal rate, no murmurs Respiratory: CTAB Abdomen: soft, non-tender, +BS Extremities: WWP, no edema  Laboratory: No results for input(s): WBC, HGB, HCT, PLT in the last 168 hours. Recent Labs  Lab 07/24/20 0254 07/25/20 0352 07/26/20 0233 07/28/20 0241  NA 136 137 138 137  K 3.5 3.6 3.7 3.9  CL 107 108 105 102  CO2 23 22 24 28   BUN <5* <5* <5* 8  CREATININE 0.56* 0.49* 0.51* 0.56*  CALCIUM 7.6* 7.6* 7.8* 8.3*  PROT 4.7* 4.7*  --   --   BILITOT 0.7 0.9  --   --   ALKPHOS 138* 123  --   --   ALT 38 36  --   --   AST 84* 60*  --   --   GLUCOSE 93 82 88 87     Imaging/Diagnostic Tests: No new imaging.  Zola Button, MD 07/30/2020, 7:32 AM PGY-1, [place] Intern pager: [phone], text pages welcome

## 2020-07-30 NOTE — Progress Notes (Signed)
   07/30/20 0800  Clinical Encounter Type  Visited With Patient  Visit Type Spiritual support  Referral From Nurse  Consult/Referral To Chaplain  Spiritual Encounters  Spiritual Needs Emotional  The chaplain visited with the patient to provide social support. The patient acknowledges that he has several health issues that he must monitor upon leaving. The patient stated he is awaiting placement. He mentioned [facility] in [place] but I am not familiar with that facility. The patient states he only has family in [place] and the rest of his family is scattered throughout the [place]. The chaplain took the time to see how the patient was coping and he stated considering the circumstances he was doing pretty Good. He did mention he has trouble breathing after taking a few steps.

## 2020-07-30 NOTE — TOC Progression Note (Signed)
Transition of Care [facility]) - Progression Note    Patient Details  Name: Anthony [name] MRN: [id number] Date of Birth: 28-Feb-1956  Transition of Care [facility]) CM/SW [place], [place] Phone Number: 07/30/2020, 4:27 PM  Clinical Narrative:     [facility] considering taking pt but wondering what pt's plan for after is.   CSW spoke with pt. He states he was living in [place] through [employer]. Pt is unclear if he can return. He states that he received and eviction notice related to an argument he had with a maintenance person. He states he lived there for a month or more after the notice. He states that he was at [facility] up until he was admitted to the hospital. Pt provides consent for CSW to contact [employer] or [facility]. Pt also confirms he receives 850$ in dissability/month.   No phone number available for [facility]. CSW leaves message for [employer] requesting return call.   Expected Discharge Plan: [place] Barriers to Discharge: Continued Medical Work up  Expected Discharge Plan and Services Expected Discharge Plan: [place] arrangements for the past 2 months: Apartment                                       Social Determinants of Health (SDOH) Interventions    Readmission Risk Interventions No flowsheet data found.

## 2020-07-31 DIAGNOSIS — D539 Nutritional anemia, unspecified: Secondary | ICD-10-CM | POA: Diagnosis not present

## 2020-07-31 DIAGNOSIS — Z659 Problem related to unspecified psychosocial circumstances: Secondary | ICD-10-CM | POA: Diagnosis not present

## 2020-07-31 DIAGNOSIS — Z87898 Personal history of other specified conditions: Secondary | ICD-10-CM | POA: Diagnosis not present

## 2020-07-31 DIAGNOSIS — F1020 Alcohol dependence, uncomplicated: Secondary | ICD-10-CM | POA: Diagnosis not present

## 2020-07-31 MED ORDER — HYDROCHLOROTHIAZIDE 25 MG PO TABS
12.5000 mg | ORAL_TABLET | Freq: Every day | ORAL | Status: DC
Start: 2020-08-01 — End: 2020-08-01
  Administered 2020-08-01: 12.5 mg via ORAL
  Filled 2020-07-31: qty 1

## 2020-07-31 MED ORDER — KETOCONAZOLE 2 % EX CREA
TOPICAL_CREAM | Freq: Every day | CUTANEOUS | Status: AC
  Administered 2020-08-01 – 2020-08-03 (×2): 1 via TOPICAL
  Filled 2020-07-31: qty 15

## 2020-07-31 NOTE — Plan of Care (Signed)
  Problem: Education: Goal: Knowledge of General Education information will improve Description: Including pain rating scale, medication(s)/side effects and non-pharmacologic comfort measures 07/31/2020 1844 by Beckey Rutter, RN Outcome: Progressing 07/31/2020 1843 by Beckey Rutter, RN Outcome: Progressing   Problem: Health Behavior/Discharge Planning: Goal: Ability to manage health-related needs will improve Outcome: Progressing   Problem: Clinical Measurements: Goal: Ability to maintain clinical measurements within normal limits will improve 07/31/2020 1844 by Beckey Rutter, RN Outcome: Progressing 07/31/2020 1843 by Beckey Rutter, RN Outcome: Progressing Goal: Diagnostic test results will improve Outcome: Progressing Goal: Cardiovascular complication will be avoided 07/31/2020 1844 by Beckey Rutter, RN Outcome: Progressing 07/31/2020 1843 by Beckey Rutter, RN Outcome: Progressing   Problem: Activity: Goal: Risk for activity intolerance will decrease Outcome: Progressing   Problem: Safety: Goal: Ability to remain free from injury will improve Outcome: Progressing   Problem: Education: Goal: Ability to demonstrate management of disease process will improve Outcome: Progressing Goal: Ability to verbalize understanding of medication therapies will improve Outcome: Progressing Goal: Individualized Educational Video(s) Outcome: Progressing   Problem: Activity: Goal: Capacity to carry out activities will improve Outcome: Progressing   Problem: Cardiac: Goal: Ability to achieve and maintain adequate cardiopulmonary perfusion will improve Outcome: Progressing

## 2020-07-31 NOTE — Progress Notes (Signed)
Family Medicine Teaching Service Daily Progress Note Intern Pager: [phone]  Patient name: Anthony [name] Medical record number: [id number] Date of birth: 10-22-1955 Age: 65 y.o. Gender: male  Primary Care Provider: Carollee Leitz, MD Consultants: none   Code Status: Full Code  Pt Overview and Major Events to Date:  1/2 Admitted  Assessment and Plan: Anthony [name] is a 65 y.o. male who presented with acute alcohol withdrawal. PMHx significant for: AUD with history of DTs, HFpEF, HTN, chronic back pain, anxiety, depression, right PE. Medically stable for discharge to SNF when bed available.  Alcohol withdrawal Resolved, s/p chlordiazepoxide taper. Mental status at baseline. Medically stable for discharge. - daily thiamine - appreciate TOC assistance with placement  A Fib New onset this admission. Started on anticoagulation this admission (1/7). Rate control initiated yesterday, rate well-controlled. - continue rivaroxaban 20 mg - continue metoprolol tartrate 12.5 mg BID  HTN BP normotensive to soft. BB started yesterday. - consider decreasing HCTZ to 12.5 mg daily - discontinue amlodipine to allow for rate control  Seborrheic dermatitis Noted on face, chronic. - start low potency topical corticosteroid  Hypomagnesemia - magnesium oxide 400 mg BID  Other problems chronic and stable: Macrocytic anemia Depression/anxiety Iron deficiency Allergic rhinitis Chronic low back pain  FEN/GI: regular diet PPx: rivaroxaban  Disposition: med-surg, SNF when bed available  Subjective:  NAOE. Requesting eye drops for dry eyes, he did not get any yesterday. He is also asking for something for his facial rash which he has had for years. He believes it was exacerbated from wearing masks.  Objective: Temp:  [97.7 F (36.5 C)-98 F (36.7 C)] 98 F (36.7 C) (01/12 0448) Pulse Rate:  [76-84] 76 (01/12 0448) Resp:  [16-18] 16 (01/12 0448) BP: (87-100)/(60-80) 100/80 (01/12 0448) SpO2:   [96 %-99 %] 98 % (01/12 0448) Weight:  [61.5 kg] 61.5 kg (01/12 0448) Physical Exam: General: Older male, resting comfortably in bed, NAD Cardiovascular: RRR, no murmurs Respiratory: CTAB Abdomen: soft, non-tender, +BS Extremities: WWP, no edema Derm: scattered areas of erythema with scaling noted mostly along beard, nose, and forehead  Laboratory: No results for input(s): WBC, HGB, HCT, PLT in the last 168 hours. Recent Labs  Lab 07/25/20 0352 07/26/20 0233 07/28/20 0241  NA 137 138 137  K 3.6 3.7 3.9  CL 108 105 102  CO2 22 24 28   BUN <5* <5* 8  CREATININE 0.49* 0.51* 0.56*  CALCIUM 7.6* 7.8* 8.3*  PROT 4.7*  --   --   BILITOT 0.9  --   --   ALKPHOS 123  --   --   ALT 36  --   --   AST 60*  --   --   GLUCOSE 82 88 87     Imaging/Diagnostic Tests: No new imaging.  Anthony Button, MD 07/31/2020, 6:55 AM Anthony [name], Anthony [name] Intern pager: [phone], text pages welcome

## 2020-07-31 NOTE — TOC Progression Note (Signed)
Transition of Care [facility]) - Progression Note    Patient Details  Name: Anthony [name] MRN: [id number] Date of Birth: 1955-11-12  Transition of Care [facility]) CM/SW [place], [place] Phone Number: 07/31/2020, 9:52 AM  Clinical Narrative:     CSW spoke with Golda Acre [phone]) from [employer]. She explained that pt has been evicted from [facility]. She stated he will no longer have possession of unit by next week and that the door would be padlocked. She explained that pt would not be eligible for any other [employer] properties because he was evicted for a criminal act. She suggested that he could seek housing at other low income properties that are not associated with [employer].   Expected Discharge Plan: [place] Barriers to Discharge: Continued Medical Work up  Expected Discharge Plan and Services Expected Discharge Plan: [place] arrangements for the past 2 months: Apartment                                       Social Determinants of Health (SDOH) Interventions    Readmission Risk Interventions No flowsheet data found.

## 2020-08-01 DIAGNOSIS — F1020 Alcohol dependence, uncomplicated: Secondary | ICD-10-CM | POA: Diagnosis not present

## 2020-08-01 DIAGNOSIS — E860 Dehydration: Secondary | ICD-10-CM | POA: Diagnosis not present

## 2020-08-01 DIAGNOSIS — E8809 Other disorders of plasma-protein metabolism, not elsewhere classified: Secondary | ICD-10-CM | POA: Diagnosis not present

## 2020-08-01 DIAGNOSIS — D539 Nutritional anemia, unspecified: Secondary | ICD-10-CM | POA: Diagnosis not present

## 2020-08-01 NOTE — Progress Notes (Signed)
Physical Therapy Treatment Patient Details Name: Anthony [name] MRN: [id number] DOB: [birth year] Today's Date: 08/01/2020    History of Present Illness Anthony [name] is a 65 y.o. male being treated for acute alcohol withdrawal . PMH is significant for alcohol use disorder with prior withdrawal and DTs, HFpEF (last Echo 2019), HTN, chronic low back pain, anxiety, depression, and h/o right pulmonary embolus.    PT Comments    Pt is progressing well towards goals. He was able to increase ambulation distance during today's session and required less assist with mobility.  There are continued concerns about pt's cognition and safety. Pt required Django cueing for safety with RW, seems to lack awareness of safety, judgment, and problem solving. Continue to feel SNF level therapies remain appropriate at this time.    Follow Up Recommendations  SNF;Supervision/Assistance - 24 hour     Equipment Recommendations  Rolling walker with 5" wheels    Recommendations for Other Services       Precautions / Restrictions Precautions Precautions: Fall    Mobility  Bed Mobility Overal bed mobility: Needs Assistance Bed Mobility: Supine to Sit     Supine to sit: Supervision;HOB elevated     General bed mobility comments: Supervision for safety.  Transfers Overall transfer level: Needs assistance Equipment used: Rolling walker (2 wheeled) Transfers: Sit to/from Stand Sit to Stand: Min guard         General transfer comment: Min guard to rise to stand from edge of bed  Ambulation/Gait Ambulation/Gait assistance: Min assist Gait Distance (Feet): 300 Feet Assistive device: Rolling walker (2 wheeled) Gait Pattern/deviations: Step-through pattern;Decreased stride length;Trunk flexed;Shuffle Gait velocity: decreased   General Gait Details: Nick cues for RW proximity and postural control. Pt with diffuculty negotiating around obsticles.   Stairs             Wheelchair Mobility     Modified Rankin (Stroke Patients Only)       Balance Overall balance assessment: Needs assistance Sitting-balance support: No upper extremity supported;Feet supported Sitting balance-Leahy Scale: Good Sitting balance - Comments: pt able to sit EOB several minutes while lines/leads adjusted and gown adjusted with no balance loss noted.   Standing balance support: No upper extremity supported;During functional activity Standing balance-Leahy Scale: Fair Standing balance comment: pt able to don mask in standing with no UE suport or LOB                            Cognition Arousal/Alertness: Awake/alert Behavior During Therapy: WFL for tasks assessed/performed Overall Cognitive Status: Impaired/Different from baseline Area of Impairment: Orientation;Attention;Following commands;Safety/judgement;Awareness;Problem solving                 Orientation Level:  (did not offically assess this session) Current Attention Level: Focused Memory: Decreased short-term memory Following Commands: Follows one step commands consistently;Follows multi-step commands with increased time Safety/Judgement: Decreased awareness of safety;Decreased awareness of deficits Awareness: Emergent Problem Solving: Slow processing;Difficulty sequencing;Requires verbal cues General Comments: Pt slow with processing. Urinating in the bed instead of using urinal and attempting to get up with out staff present.      Exercises      General Comments        Pertinent Vitals/Pain Pain Assessment: No/denies pain    Home Living                      Prior Function  PT Goals (current goals can now be found in the care plan section) Acute Rehab PT Goals Patient Stated Goal: "get stronger." PT Goal Formulation: With patient Time For Goal Achievement: 08/07/20 Potential to Achieve Goals: Fair Progress towards PT goals: Progressing toward goals    Frequency    Min  2X/week      PT Plan Frequency needs to be updated;Current plan remains appropriate    Co-evaluation              AM-PAC PT "6 Clicks" Mobility   Outcome Measure  Help needed turning from your back to your side while in a flat bed without using bedrails?: None Help needed moving from lying on your back to sitting on the side of a flat bed without using bedrails?: None Help needed moving to and from a bed to a chair (including a wheelchair)?: A Bon Help needed standing up from a chair using your arms (e.g., wheelchair or bedside chair)?: A Gressman Help needed to walk in hospital room?: A Ricciardi Help needed climbing 3-5 steps with a railing? : A Lot 6 Click Score: 19    End of Session Equipment Utilized During Treatment: Gait belt Activity Tolerance: Patient tolerated treatment well Patient left: in chair;with call bell/phone within reach;with chair alarm set Nurse Communication: Mobility status (need for clean bed linins) PT Visit Diagnosis: Unsteadiness on feet (R26.81);Muscle weakness (generalized) (M62.81);History of falling (Z91.81)     Time: 6286-3817 PT Time Calculation (min) (ACUTE ONLY): 24 min  Charges:  $Gait Training: 23-37 mins                     Benjiman Core, [place] Pager [phone] Acute Rehab   Allena Katz 08/01/2020, 3:11 PM

## 2020-08-01 NOTE — Progress Notes (Signed)
Family Medicine Teaching Service Daily Progress Note Intern Pager: [phone]  Patient name: Anthony [name] Medical record number: [id number] Date of birth: [birth year] Age: 65 y.o. Gender: male  Primary Care Provider: Carollee Leitz, MD Consultants: none   Code Status: Full Code  Pt Overview and Major Events to Date:  1/2 Admitted  Assessment and Plan: Anthony [name] is a 65 y.o. male who presented with acute alcohol withdrawal. PMHx significant for: AUD with history of DTs, HFpEF, HTN, chronic back pain, anxiety, depression, right PE. Medically stable for discharge to SNF when bed available.  Alcohol withdrawal Resolved, s/p chlordiazepoxide taper. Mental status at baseline. Medically stable for discharge. - daily thiamine - appreciate TOC assistance with placement  A Fib New onset this admission. Started on anticoagulation this admission (1/7). Rate control initiated yesterday, rate well-controlled. - continue rivaroxaban 20 mg - continue metoprolol tartrate 12.5 mg BID  HTN BP normotensive to soft. HCTZ decreased to 12.5 mg daily yesterday. - consider discontinuing HCTZ - discontinue amlodipine to allow for rate control  Seborrheic dermatitis Noted on face, chronic. Started topical antifungal yesterday. Slightly improved. - ketoconazole cream  Hypomagnesemia - magnesium oxide 400 mg BID  Other problems chronic and stable: Macrocytic anemia Depression/anxiety Iron deficiency Allergic rhinitis Chronic low back pain  FEN/GI: regular diet PPx: rivaroxaban  Disposition: med-surg, SNF when bed available  Subjective:  NAOE. No concerns this AM. Feels his rash is slightly improved.  Objective: Temp:  [97.7 F (36.5 C)-98.2 F (36.8 C)] 97.7 F (36.5 C) (01/13 0426) Pulse Rate:  [76-88] 76 (01/13 0426) Resp:  [18] 18 (01/13 0426) BP: (91-102)/(72-75) 102/75 (01/13 0426) SpO2:  [95 %-98 %] 98 % (01/13 0426) Weight:  [61.5 kg] 61.5 kg (01/13 0426) Physical  Exam: General: Older male, resting comfortably in bed, NAD Cardiovascular: RRR, no murmurs Respiratory: CTAB Abdomen: soft, non-tender, +BS Extremities: WWP, no edema Derm: scattered areas of erythema with scaling noted mostly along beard, nose, and forehead mostly concentrated around hair, improved from yesterday  Laboratory: No results for input(s): WBC, HGB, HCT, PLT in the last 168 hours. Recent Labs  Lab 07/26/20 0233 07/28/20 0241  NA 138 137  K 3.7 3.9  CL 105 102  CO2 24 28  BUN <5* 8  CREATININE 0.51* 0.56*  CALCIUM 7.8* 8.3*  GLUCOSE 88 87     Imaging/Diagnostic Tests: No new imaging.  Zola Button, MD 08/01/2020, 7:16 AM PGY-1, [place] Intern pager: [phone], text pages welcome

## 2020-08-01 NOTE — Progress Notes (Signed)
Occupational Therapy Treatment Patient Details Name: Anthony [name] MRN: [id number] DOB: 1955/09/09 Today's Date: 08/01/2020    History of present illness Anthony [name] is a 65 y.o. male being treated for acute alcohol withdrawal . PMH is significant for alcohol use disorder with prior withdrawal and DTs, HFpEF (last Echo 2019), HTN, chronic low back pain, anxiety, depression, and h/o right pulmonary embolus.   OT comments  Pt making steady progress towards OT goals this session.  Pt continues to present with cognitive impairments, and decreased strength and functional endurance however pt was able to progress functional mobility distance this session to greater than a household distance with RW and min A to manage RW around obstacles or in tight places. pt on RA during session with sats 100% and HR 86 bpm post mobility. Pt continues to present with cognitive impairments in the areas of orientation, attention, following commands, safety/judgement, awareness, and Problem solving. Pt would continue to benefit from skilled occupational therapy while admitted and after d/c to address the below listed limitations in order to improve overall functional mobility and facilitate independence with BADL participation. DC plan remains appropriate, will follow acutely per POC.     Follow Up Recommendations  SNF;Supervision/Assistance - 24 hour    Equipment Recommendations  3 in 1 bedside commode    Recommendations for Other Services      Precautions / Restrictions Precautions Precautions: Fall Restrictions Weight Bearing Restrictions: No       Mobility Bed Mobility Overal bed mobility: Needs Assistance Bed Mobility: Supine to Sit     Supine to sit: Supervision;HOB elevated     General bed mobility comments: Supervision for safety.  Transfers Overall transfer level: Needs assistance Equipment used: Rolling walker (2 wheeled) Transfers: Sit to/from Stand Sit to Stand: Min guard          General transfer comment: Min guard to rise to stand from edge of bed    Balance Overall balance assessment: Needs assistance Sitting-balance support: No upper extremity supported;Feet supported Sitting balance-Leahy Scale: Good     Standing balance support: No upper extremity supported;During functional activity Standing balance-Leahy Scale: Fair Standing balance comment: pt able to don mask in standing with no UE suport or LOB                           ADL either performed or assessed with clinical judgement   ADL Overall ADL's : Needs assistance/impaired             Lower Body Bathing: Supervison/ safety;Sitting/lateral leans Lower Body Bathing Details (indicate cue type and reason): simulated via LB dressing task     Lower Body Dressing: Supervision/safety;Sitting/lateral leans Lower Body Dressing Details (indicate cue type and reason): to adjust socks from EOB Toilet Transfer: Ambulation;RW;Minimal assistance Toilet Transfer Details (indicate cue type and reason): simulated via functional mobility         Functional mobility during ADLs: Rolling walker;Minimal assistance;Cueing for safety General ADL Comments: pt able to progress functional mobility distance with RW with MIN A to greater than a household distance. pt continues to present with cognitive impairments, decreased strength and functional endurance     Vision       Perception     Praxis      Cognition Arousal/Alertness: Awake/alert Behavior During Therapy: WFL for tasks assessed/performed Overall Cognitive Status: Impaired/Different from baseline Area of Impairment: Orientation;Attention;Following commands;Safety/judgement;Awareness;Problem solving  Orientation Level: Disoriented to;Time (pt disoriented to day of week but able to problem solve month with cues, pt oriented to year) Current Attention Level: Focused   Following Commands: Follows one step commands  consistently;Follows multi-step commands with increased time Safety/Judgement: Decreased awareness of safety;Decreased awareness of deficits Awareness: Emergent Problem Solving: Slow processing;Difficulty sequencing;Requires verbal cues General Comments: pt alert and oriented however very slow to process and often answering questions based on being at home although pt was able to state he was at [facility].        Exercises     Shoulder Instructions       General Comments pt on RA during session with sats 100% and HR 86 bpm post mobility. pt reports going to rehab in that past for alcohol but reports he wants to go to a more holistic mental health facility    Pertinent Vitals/ Pain       Pain Assessment: Faces Faces Pain Scale: Hurts a Dano bit Pain Location: baseline back pain Pain Descriptors / Indicators: Sore Pain Intervention(s): Monitored during session  Home Living                                          Prior Functioning/Environment              Frequency  Min 2X/week        Progress Toward Goals  OT Goals(current goals can now be found in the care plan section)  Progress towards OT goals: Progressing toward goals  Acute Rehab OT Goals Patient Stated Goal: "get stronger." OT Goal Formulation: With patient Time For Goal Achievement: 08/07/20 Potential to Achieve Goals: Good  Plan Discharge plan remains appropriate;Frequency remains appropriate    Co-evaluation                 AM-PAC OT "6 Clicks" Daily Activity     Outcome Measure   Help from another person eating meals?: None Help from another person taking care of personal grooming?: None Help from another person toileting, which includes using toliet, bedpan, or urinal?: A Dicicco Help from another person bathing (including washing, rinsing, drying)?: A Justin Help from another person to put on and taking off regular upper body clothing?: None Help from  another person to put on and taking off regular lower body clothing?: None 6 Click Score: 22    End of Session Equipment Utilized During Treatment: Gait belt;Rolling walker  OT Visit Diagnosis: Unsteadiness on feet (R26.81);Muscle weakness (generalized) (M62.81);Other symptoms and signs involving cognitive function   Activity Tolerance Patient tolerated treatment well   Patient Left in chair;with call bell/phone within reach;with chair alarm set   Nurse Communication Mobility status        Time: 7353-2992 OT Time Calculation (min): 20 min  Charges: OT General Charges $OT Visit: 1 Visit OT Treatments $Self Care/Home Management : 8-22 mins  Harley Alto., COTA/L Acute Rehabilitation Services [phone]    Precious Haws 08/01/2020, 11:13 AM

## 2020-08-01 NOTE — Progress Notes (Signed)
CSW inquired with [facility] if they would be able to accept pt. CSW is informed they have multiple staff out with covid and have unable come to a decision. They will continue to follow pt for possibility to offer bed.

## 2020-08-02 DIAGNOSIS — F10232 Alcohol dependence with withdrawal with perceptual disturbance: Secondary | ICD-10-CM | POA: Diagnosis not present

## 2020-08-02 NOTE — Plan of Care (Signed)
  Problem: Clinical Measurements: Goal: Ability to maintain clinical measurements within normal limits will improve Outcome: Progressing   Problem: Safety: Goal: Ability to remain free from injury will improve 08/02/2020 1646 by Gertie Fey, RN Outcome: Progressing 08/02/2020 1354 by Gertie Fey, RN Outcome: Progressing  Patient has been compliant with safety measures today.

## 2020-08-02 NOTE — Progress Notes (Signed)
Occupational Therapy Treatment Patient Details Name: Anthony [name] MRN: [id number] DOB: February 16, 1956 Today's Date: 08/02/2020    History of present illness Anthony [name] is a 65 y.o. male being treated for acute alcohol withdrawal . PMH is significant for alcohol use disorder with prior withdrawal and DTs, HFpEF (last Echo 2019), HTN, chronic low back pain, anxiety, depression, and h/o right pulmonary embolus.   OT comments  Session focus on higher level cognitive task to facilitate IADL and ADL engagment. Pt received supine in bed agreeable to cognitive task where pt instructed to distribute pills in weekly pillbox set-up based on instructions on pill bottles. Pt required step by step cues to complete placement of 3 pill bottles. Pt continues to present with poor planning, poor mental flexibility, impaired suboptimal search strategies, and decreased concrete thinking and the inability to multitask. Continue to recommend SNF and 24 hour care for higher level cognitive tasks such as medication mgmt. Will continue to follow acutely per POC.        Follow Up Recommendations  SNF;Supervision/Assistance - 24 hour    Equipment Recommendations       Recommendations for Other Services      Precautions / Restrictions Precautions Precautions: Fall Restrictions Weight Bearing Restrictions: No       Mobility Bed Mobility               General bed mobility comments: pt repositioning self in bed wth no physcial assist need with HOB elevated  Transfers                 General transfer comment: session focus on cog tasks    Balance                                           ADL either performed or assessed with clinical judgement   ADL Overall ADL's : Needs assistance/impaired                                       General ADL Comments: session focus on higher level cognitive task to facilitate functional independence with IADL and BADL  tasks     Vision       Perception     Praxis      Cognition Arousal/Alertness: Awake/alert Behavior During Therapy: WFL for tasks assessed/performed Overall Cognitive Status: Impaired/Different from baseline Area of Impairment: Attention;Memory;Following commands;Safety/judgement;Awareness;Problem solving                   Current Attention Level: Focused Memory: Decreased short-term memory (unable to follow directions given at start of task) Following Commands: Follows one step commands with increased time;Follows multi-step commands inconsistently Safety/Judgement: Decreased awareness of safety (pt states " I usually just take whatever pills I need at home") Awareness: Intellectual Problem Solving: Slow processing;Difficulty sequencing;Requires verbal cues;Requires tactile cues General Comments: worked on higher level cognitive task of medication mgmt with pt unable to correctly distribute pills into correct sequence without Anthony [name] step by step cues. intially started out with 5 different pill bottles and instructed pt to distribute the medication in a pillbox for one week with pt perseverating on the pllls not being his own therefore pt could not know how to distribute them. Pt required OTA to read out each bottle and cue pt step  by step of how many he would need and how to distribue the items. When pointing out pts mistakes, pt with no awareness into mistakes stating "well I Just take whatever I need at home."        Exercises     Shoulder Instructions       General Comments      Pertinent Vitals/ Pain       Pain Assessment: No/denies pain  Home Living                                          Prior Functioning/Environment              Frequency  Min 2X/week        Progress Toward Goals  OT Goals(current goals can now be found in the care plan section)  Progress towards OT goals: Progressing toward goals  Acute Rehab OT  Goals Patient Stated Goal: "get stronger." OT Goal Formulation: With patient Time For Goal Achievement: 08/07/20 Potential to Achieve Goals: Good  Plan Discharge plan remains appropriate;Frequency remains appropriate    Co-evaluation                 AM-PAC OT "6 Clicks" Daily Activity     Outcome Measure   Help from another person eating meals?: None Help from another person taking care of personal grooming?: None Help from another person toileting, which includes using toliet, bedpan, or urinal?: A Lipschutz Help from another person bathing (including washing, rinsing, drying)?: A [place] Help from another person to put on and taking off regular upper body clothing?: None Help from another person to put on and taking off regular lower body clothing?: None 6 Click Score: 22    End of Session    OT Visit Diagnosis: Unsteadiness on feet (R26.81);Muscle weakness (generalized) (M62.81);Other symptoms and signs involving cognitive function   Activity Tolerance Patient tolerated treatment well   Patient Left in bed;with call bell/phone within reach;with bed alarm set   Nurse Communication Mobility status        Time: 8811-0315 OT Time Calculation (min): 30 min  Charges: OT General Charges $OT Visit: 1 Visit OT Treatments $Self Care/Home Management : 23-37 mins  Anthony Alto., COTA/L Acute Rehabilitation Services [phone]    Anthony [name] 08/02/2020, 4:41 PM
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
## 2020-08-02 NOTE — Progress Notes (Signed)
Family Medicine Teaching Service Daily Progress Note Intern Pager: [phone]  Patient name: Anthony [name] Medical record number: [id number] Date of birth: 1955-08-08 Age: 65 y.o. Gender: male  Primary Care Provider: Carollee Leitz, MD Consultants: none   Code Status: Full Code  Pt Overview and Major Events to Date:  1/2 Admitted  Assessment and Plan: [name] is a 65 y.o. male who presented with acute alcohol withdrawal. PMHx significant for: AUD with history of DTs, HFpEF, HTN, chronic back pain, anxiety, depression, right PE. Medically stable for discharge to SNF when bed available.  Alcohol withdrawal Resolved, s/p chlordiazepoxide taper. Mental status at baseline. A&OX4. Medically stable for discharge. - daily thiamine - appreciate TOC assistance with placement (See Dr. Nancy Fetter note from 1/14 conversation with brother)  A Fib New onset this admission. Started on anticoagulation this admission (1/7). Rate control initiated 1/13, rate well-controlled. HR 68.  - continue rivaroxaban 20 mg - continue metoprolol tartrate 12.5 mg BID  HTN BP normotensive to soft 99/75. HCTZ and amlodipine discontinued.  Seborrheic dermatitis Noted on face, chronic. Started topical antifungal yesterday. Slightly improved. - ketoconazole cream  Hypomagnesemia - magnesium oxide 400 mg BID  Other problems chronic and stable: Macrocytic anemia Depression/anxiety Iron deficiency Allergic rhinitis Chronic low back pain  FEN/GI: regular diet PPx: rivaroxaban  Disposition: med-surg, SNF when bed available  Subjective:  No acute events overnight. He states he is doing well, just sleepy.   Objective: Temp:  [97.6 F (36.4 C)-97.8 F (36.6 C)] 97.8 F (36.6 C) (01/14 2053) Pulse Rate:  [69-88] 76 (01/14 2053) Resp:  [15-16] 16 (01/14 2053) BP: (90-105)/(66-76) 98/70 (01/14 2053) SpO2:  [94 %-96 %] 94 % (01/14 2053) Weight:  [60.8 kg] 60.8 kg (01/14 0305) Physical Exam:  General: Initially  sleeping aroused to name, no acute distress. Age appropriate. Cardiac: RRR, normal heart sounds, no murmurs Respiratory: CTAB, normal effort Abdomen: soft, nontender, nondistended, NABS Extremities: No edema or cyanosis. Neuro: alert and oriented x4 Psych: normal affect  Laboratory: No results for input(s): WBC, HGB, HCT, PLT in the last 168 hours. Recent Labs  Lab 07/28/20 0241  NA 137  K 3.9  CL 102  CO2 28  BUN 8  CREATININE 0.56*  CALCIUM 8.3*  GLUCOSE 87    Imaging/Diagnostic Tests: No new imaging.  Gerlene Fee, DO 08/02/2020, 10:15 PM PGY-2, [place] Intern pager: [phone], text pages welcome

## 2020-08-02 NOTE — Plan of Care (Signed)
  Problem: Education: Goal: Knowledge of General Education information will improve Description Including pain rating scale, medication(s)/side effects and non-pharmacologic comfort measures Outcome: Progressing   

## 2020-08-02 NOTE — Plan of Care (Signed)

## 2020-08-02 NOTE — Progress Notes (Signed)
Family Medicine Teaching Service Daily Progress Note Intern Pager: [phone]  Patient name: Anthony [name] Medical record number: [id number] Date of birth: 12-12-1955 Age: 65 y.o. Gender: male  Primary Care Provider: Carollee Leitz, MD Consultants: none   Code Status: Full Code  Pt Overview and Major Events to Date:  1/2 Admitted  Assessment and Plan: Emmanuell A Catanese is a 65 y.o. male who presented with acute alcohol withdrawal. PMHx significant for: AUD with history of DTs, HFpEF, HTN, chronic back pain, anxiety, depression, right PE. Medically stable for discharge to SNF when bed available.  Alcohol withdrawal Resolved, s/p chlordiazepoxide taper. Mental status at baseline. Medically stable for discharge. - daily thiamine - appreciate TOC assistance with placement  A Fib New onset this admission. Started on anticoagulation this admission (1/7). Rate control initiated yesterday, rate well-controlled. - continue rivaroxaban 20 mg - continue metoprolol tartrate 12.5 mg BID  HTN BP normotensive to soft. HCTZ and amlodipine discontinued.  Seborrheic dermatitis Noted on face, chronic. Started topical antifungal yesterday. Slightly improved. - ketoconazole cream  Hypomagnesemia - magnesium oxide 400 mg BID  Other problems chronic and stable: Macrocytic anemia Depression/anxiety Iron deficiency Allergic rhinitis Chronic low back pain  FEN/GI: regular diet PPx: rivaroxaban  Disposition: med-surg, SNF when bed available  Subjective:  NAOE. Facial rash improved. No concerns expressed at this time.  Objective: Temp:  [97.6 F (36.4 C)-98.2 F (36.8 C)] 97.6 F (36.4 C) (01/14 0315) Pulse Rate:  [69-88] 69 (01/14 0315) Resp:  [16-19] 16 (01/14 0315) BP: (95-97)/(68-69) 97/69 (01/14 0315) SpO2:  [94 %-100 %] 95 % (01/14 0315) Weight:  [60.8 kg] 60.8 kg (01/14 0305) Physical Exam: General: Older male, resting comfortably in bed, NAD Cardiovascular: RRR, no  murmurs Respiratory: CTAB Abdomen: soft, non-tender, +BS Extremities: WWP, no edema Derm: minimal scattered areas of scaling noted mostly along beard, nose, and forehead mostly concentrated around hair, significantly improved  Laboratory: No results for input(s): WBC, HGB, HCT, PLT in the last 168 hours. Recent Labs  Lab 07/28/20 0241  NA 137  K 3.9  CL 102  CO2 28  BUN 8  CREATININE 0.56*  CALCIUM 8.3*  GLUCOSE 87     Imaging/Diagnostic Tests: No new imaging.  Zola Button, MD 08/02/2020, 7:31 AM PGY-1, [place] Intern pager: [phone], text pages welcome

## 2020-08-03 DIAGNOSIS — E8809 Other disorders of plasma-protein metabolism, not elsewhere classified: Secondary | ICD-10-CM | POA: Diagnosis not present

## 2020-08-03 DIAGNOSIS — F1020 Alcohol dependence, uncomplicated: Secondary | ICD-10-CM | POA: Diagnosis not present

## 2020-08-03 DIAGNOSIS — Z659 Problem related to unspecified psychosocial circumstances: Secondary | ICD-10-CM | POA: Diagnosis not present

## 2020-08-03 DIAGNOSIS — F10232 Alcohol dependence with withdrawal with perceptual disturbance: Secondary | ICD-10-CM | POA: Diagnosis not present

## 2020-08-03 NOTE — Progress Notes (Signed)
Order placed by attending physician to remove condom cath. Discussed patient safety issues with day shift charge nurse. Charge nurse will address issues with rounding doctors.

## 2020-08-04 DIAGNOSIS — D539 Nutritional anemia, unspecified: Secondary | ICD-10-CM | POA: Diagnosis not present

## 2020-08-04 DIAGNOSIS — Z659 Problem related to unspecified psychosocial circumstances: Secondary | ICD-10-CM | POA: Diagnosis not present

## 2020-08-04 DIAGNOSIS — E860 Dehydration: Secondary | ICD-10-CM | POA: Diagnosis not present

## 2020-08-04 DIAGNOSIS — F1020 Alcohol dependence, uncomplicated: Secondary | ICD-10-CM | POA: Diagnosis not present

## 2020-08-04 MED ORDER — INFLUENZA VAC SPLIT QUAD 0.5 ML IM SUSY
0.5000 mL | PREFILLED_SYRINGE | INTRAMUSCULAR | Status: AC
  Administered 2020-08-05: 0.5 mL via INTRAMUSCULAR
  Filled 2020-08-04: qty 0.5

## 2020-08-04 NOTE — Progress Notes (Signed)
Family Medicine Teaching Service Daily Progress Note Intern Pager: [phone]  Patient name: Anthony [name] Medical record number: [id number] Date of birth: 02-09-1956 Age: 65 y.o. Gender: male  Primary Care Provider: Carollee Leitz, MD Consultants: none   Code Status: Full Code  Pt Overview and Major Events to Date:  1/2 Admitted  Assessment and Plan: Anthony [name] is a 65 y.o. male who presented with acute alcohol withdrawal. PMHx significant for: AUD with history of DTs, HFpEF, HTN, chronic back pain, anxiety, depression, right PE. Medically stable for discharge to SNF when bed available.  Alcohol withdrawal Resolved, s/p chlordiazepoxide taper. Mental status at baseline. Medically stable for discharge. - daily thiamine - appreciate TOC assistance with placement  A Fib New onset this admission. Started on anticoagulation this admission (1/7). - continue rivaroxaban 20 mg - continue metoprolol tartrate 12.5 mg BID  HTN Hypertensive 146/127 this AM though other readings have been normotensive to soft. HCTZ and amlodipine were discontinued this admission. Will continue to monitor for now and consider restarting antihypertensives if needed.  Seborrheic dermatitis Noted on face, chronic. Improving. - ketoconazole cream  Hypomagnesemia - magnesium oxide 400 mg BID  Other problems chronic and stable: Macrocytic anemia Depression/anxiety Iron deficiency Allergic rhinitis Chronic low back pain  FEN/GI: regular diet PPx: rivaroxaban  Disposition: med-surg, SNF when bed available  Subjective:  NAOE. No concerns this AM. Patient states he believes he has his wallet and identification with him.  Objective: Temp:  [97.5 F (36.4 C)-98.8 F (37.1 C)] 98 F (36.7 C) (01/16 0532) Pulse Rate:  [72-83] 74 (01/16 0532) Resp:  [16-20] 16 (01/16 0532) BP: (86-146)/(58-127) 146/127 (01/16 0532) SpO2:  [96 %-98 %] 97 % (01/16 0532) Weight:  [62.4 kg] 62.4 kg (01/16 0543) Physical  Exam: General: Older male, resting comfortably in bed, NAD Cardiovascular: RRR, no murmurs Respiratory: CTAB Extremities: WWP, no edema  Laboratory: No results for input(s): WBC, HGB, HCT, PLT in the last 168 hours. No results for input(s): NA, K, CL, CO2, BUN, CREATININE, CALCIUM, PROT, BILITOT, ALKPHOS, ALT, AST, GLUCOSE in the last 168 hours.  Invalid input(s): LABALBU   Imaging/Diagnostic Tests: No new imaging.  Anthony Button, MD 08/04/2020, 7:36 AM PGY-1, [place] Intern pager: [phone], text pages welcome

## 2020-08-05 DIAGNOSIS — D539 Nutritional anemia, unspecified: Secondary | ICD-10-CM | POA: Diagnosis not present

## 2020-08-05 DIAGNOSIS — F1020 Alcohol dependence, uncomplicated: Secondary | ICD-10-CM | POA: Diagnosis not present

## 2020-08-05 DIAGNOSIS — E860 Dehydration: Secondary | ICD-10-CM | POA: Diagnosis not present

## 2020-08-05 DIAGNOSIS — Z659 Problem related to unspecified psychosocial circumstances: Secondary | ICD-10-CM | POA: Diagnosis not present

## 2020-08-05 NOTE — Progress Notes (Addendum)
Family Medicine Teaching Service Daily Progress Note Intern Pager: [phone]  Patient name: Anthony [name] Medical record number: [id number] Date of birth: 08-May-1956 Age: 65 y.o. Gender: male  Primary Care Provider: Carollee Leitz, MD Consultants: none   Code Status: Full Code  Pt Overview and Major Events to Date:  1/2 Admitted  Assessment and Plan: Anthony [name] is a 64 y.o. male who presented with acute alcohol withdrawal. PMHx significant for: AUD with history of DTs, HFpEF, HTN, chronic back pain, anxiety, depression, right PE. Medically stable for discharge to SNF when bed available.  A Fib New onset this admission. Started on anticoagulation this admission (1/7). Rate well-controlled. - continue rivaroxaban 20 mg - continue metoprolol tartrate 12.5 mg BID  Seborrheic dermatitis Noted on face, chronic. Improving. - ketoconazole cream x 7 days  Hypomagnesemia - magnesium oxide 400 mg BID - re-check Mg tomorrow  Alcohol withdrawal Resolved, s/p chlordiazepoxide taper. Mental status at baseline. Medically stable for discharge. - daily thiamine - appreciate TOC assistance with placement  HTN Normotensive to soft. HCTZ and amlodipine were discontinued this admission.  Depression/anxiety - sertraline 200 mg daily  Chronic low back pain - Tylenol prn - lidocaine patch prn - baclofen 10 mg TID prn  Other problems chronic and stable: Macrocytic anemia Iron deficiency Allergic rhinitis  FEN/GI: regular diet PPx: rivaroxaban  Disposition: med-surg, SNF when bed available  Subjective:  NAOE. No concerns this AM. Hoping to get placement soon.  Objective: Temp:  [97.8 F (36.6 C)-98.3 F (36.8 C)] 98.3 F (36.8 C) (01/17 0550) Pulse Rate:  [73-93] 75 (01/17 0550) Resp:  [16-20] 16 (01/17 0550) BP: (91-115)/(66-81) 115/81 (01/17 0550) SpO2:  [97 %-100 %] 100 % (01/17 0550) Weight:  [62.2 kg] 62.2 kg (01/17 0547) Physical Exam: General: Older male, resting  comfortably in bed, NAD Cardiovascular: RRR, no murmurs Respiratory: CTAB Extremities: WWP, no edema  Laboratory: No results for input(s): WBC, HGB, HCT, PLT in the last 168 hours. No results for input(s): NA, K, CL, CO2, BUN, CREATININE, CALCIUM, PROT, BILITOT, ALKPHOS, ALT, AST, GLUCOSE in the last 168 hours.  Invalid input(s): LABALBU   Imaging/Diagnostic Tests: No new imaging.  Zola Button, MD 08/05/2020, 7:27 AM PGY-1, [place] Intern pager: [phone], text pages welcome

## 2020-08-05 NOTE — Progress Notes (Signed)
Physical Therapy Treatment Patient Details Name: Anthony [name] MRN: [id number] DOB: [birth year] Today's Date: 08/05/2020    History of Present Illness Anthony [name] is a 65 y.o. male being treated for acute alcohol withdrawal . PMH is significant for alcohol use disorder with prior withdrawal and DTs, HFpEF (last Echo 2019), HTN, chronic low back pain, anxiety, depression, and h/o right pulmonary embolus.    PT Comments    Pt progressing towards his physical therapy goals, exhibiting improved activity tolerance and ambulation distance. Session focused on functional mobility and dual tasking with cognitive tasks. Pt ambulating hallway distances with no assistive device and up to min assist with balance challenge. Scoring 13/24 on the Dynamic Gait Index, indicating pt is at high risk for falls, in addition to a decreased gait speed of 1.77 ft/s. Continue to recommend SNF for ongoing Physical Therapy.     Follow Up Recommendations  SNF;Supervision/Assistance - 24 hour     Equipment Recommendations  Rolling walker with 5" wheels    Recommendations for Other Services       Precautions / Restrictions Precautions Precautions: Fall Restrictions Weight Bearing Restrictions: No    Mobility  Bed Mobility               General bed mobility comments: OOB in chair  Transfers Overall transfer level: Needs assistance Equipment used: None Transfers: Sit to/from Stand Sit to Stand: Supervision         General transfer comment: Supervision for safety  Ambulation/Gait Ambulation/Gait assistance: Min guard;Min assist Gait Distance (Feet): 400 Feet Assistive device: None Gait Pattern/deviations: Step-through pattern;Decreased stride length;Trunk flexed;Shuffle Gait velocity: 1.77 ft/s Gait velocity interpretation: <1.8 ft/sec, indicate of risk for recurrent falls General Gait Details: Pt requiring min guard assist for level ground ambulation, up to minA for balance challenge. Pt  running into 1-2 obstacles   Stairs             Wheelchair Mobility    Modified Rankin (Stroke Patients Only)       Balance Overall balance assessment: Needs assistance Sitting-balance support: No upper extremity supported;Feet supported Sitting balance-Leahy Scale: Good     Standing balance support: No upper extremity supported;During functional activity Standing balance-Leahy Scale: Fair                   Standardized Balance Assessment Standardized Balance Assessment : Dynamic Gait Index   Dynamic Gait Index Level Surface: Mild Impairment Change in Gait Speed: Moderate Impairment Gait with Horizontal Head Turns: Mild Impairment Gait with Vertical Head Turns: Mild Impairment Gait and Pivot Turn: Moderate Impairment Step Over Obstacle: Mild Impairment Step Around Obstacles: Mild Impairment Steps: Moderate Impairment Total Score: 13      Cognition Arousal/Alertness: Awake/alert Behavior During Therapy: WFL for tasks assessed/performed Overall Cognitive Status: Impaired/Different from baseline Area of Impairment: Attention;Memory;Following commands;Safety/judgement;Awareness;Problem solving                 Orientation Level: Disoriented to;Time Current Attention Level: Selective Memory: Decreased short-term memory Following Commands: Follows one step commands with increased time;Follows multi-step commands inconsistently Safety/Judgement: Decreased awareness of safety Awareness: Intellectual Problem Solving: Slow processing;Difficulty sequencing;Requires verbal cues;Requires tactile cues General Comments: Pt not oriented to day of the week; requires increased time for motor planning tasks and following multi step commands inconsistently. Pt with urinary incontinence upon entrance, attempting to get to the bathroom      Exercises      General Comments        Pertinent  Vitals/Pain Pain Assessment: Faces Faces Pain Scale: No hurt     Home Living                      Prior Function            PT Goals (current goals can now be found in the care plan section) Acute Rehab PT Goals Patient Stated Goal: "get stronger." PT Goal Formulation: With patient Time For Goal Achievement: 08/19/20 Potential to Achieve Goals: Fair Progress towards PT goals: Progressing toward goals    Frequency    Min 2X/week      PT Plan Current plan remains appropriate    Co-evaluation              AM-PAC PT "6 Clicks" Mobility   Outcome Measure  Help needed turning from your back to your side while in a flat bed without using bedrails?: None Help needed moving from lying on your back to sitting on the side of a flat bed without using bedrails?: None Help needed moving to and from a bed to a chair (including a wheelchair)?: None Help needed standing up from a chair using your arms (e.g., wheelchair or bedside chair)?: None Help needed to walk in hospital room?: A Marich Help needed climbing 3-5 steps with a railing? : A [place] 6 Click Score: 22    End of Session Equipment Utilized During Treatment: Gait belt Activity Tolerance: Patient tolerated treatment well Patient left: in chair;with call bell/phone within reach;with chair alarm set Nurse Communication: Mobility status PT Visit Diagnosis: Unsteadiness on feet (R26.81);Muscle weakness (generalized) (M62.81);History of falling (Z91.81)     Time: 6384-5364 PT Time Calculation (min) (ACUTE ONLY): 19 min  Charges:  $Therapeutic Activity: 8-22 mins                     Wyona Almas, PT, DPT Acute Rehabilitation Services Pager [phone] Office [phone]    Deno Etienne 08/05/2020, 3:39 PM

## 2020-08-05 NOTE — Progress Notes (Addendum)
[facility] has concerns about incident where pt was evicted CSW called Golda Acre [phone]) with [employer]. Left message requesting further details of pt's eviction.   36: [facility] requesting they re-review pt for potential bed offer.

## 2020-08-06 DIAGNOSIS — Z659 Problem related to unspecified psychosocial circumstances: Secondary | ICD-10-CM | POA: Diagnosis not present

## 2020-08-06 DIAGNOSIS — F1020 Alcohol dependence, uncomplicated: Secondary | ICD-10-CM | POA: Diagnosis not present

## 2020-08-06 DIAGNOSIS — E860 Dehydration: Secondary | ICD-10-CM | POA: Diagnosis not present

## 2020-08-06 DIAGNOSIS — D539 Nutritional anemia, unspecified: Secondary | ICD-10-CM | POA: Diagnosis not present

## 2020-08-06 LAB — BASIC METABOLIC PANEL
Anion gap: 8 (ref 5–15)
BUN: 11 mg/dL (ref 8–23)
CO2: 25 mmol/L (ref 22–32)
Calcium: 9.3 mg/dL (ref 8.9–10.3)
Chloride: 102 mmol/L (ref 98–111)
Creatinine, Ser: 0.66 mg/dL (ref 0.61–1.24)
GFR, Estimated: >60 mL/min (ref >60)
Glucose, Bld: 83 mg/dL (ref 70–99)
Potassium: 3.8 mmol/L (ref 3.5–5.1)
Sodium: 135 mmol/L (ref 135–145)

## 2020-08-06 LAB — MAGNESIUM: Magnesium: 1.6 mg/dL — ABNORMAL LOW (ref 1.7–2.4)

## 2020-08-06 LAB — CBC
HCT: 34.5 % — ABNORMAL LOW (ref 39.0–52.0)
Hemoglobin: 11.2 g/dL — ABNORMAL LOW (ref 13.0–17.0)
MCH: 35.6 pg — ABNORMAL HIGH (ref 26.0–34.0)
MCHC: 32.5 g/dL (ref 30.0–36.0)
MCV: 109.5 fL — ABNORMAL HIGH (ref 80.0–100.0)
Platelets: 414 10*3/uL — ABNORMAL HIGH (ref 150–400)
RBC: 3.15 MIL/uL — ABNORMAL LOW (ref 4.22–5.81)
RDW: 12.7 % (ref 11.5–15.5)
WBC: 8.4 10*3/uL (ref 4.0–10.5)
nRBC: 0 % (ref 0.0–0.2)
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
## 2020-08-06 NOTE — Progress Notes (Signed)
Family Medicine Teaching Service Daily Progress Note Intern Pager: [phone]  Patient name: Anthony [name] Medical record number: [id number] Date of birth: 1956-02-25 Age: 65 y.o. Gender: male  Primary Care Provider: Carollee Leitz, MD Consultants: none   Code Status: Full Code  Pt Overview and Major Events to Date:  1/2 Admitted  Assessment and Plan: Anthony [name] is a 65 y.o. male who presented with acute alcohol withdrawal. PMHx significant for: AUD with history of DTs, HFpEF, HTN, chronic back pain, anxiety, depression, right PE. Medically stable for discharge to SNF when bed available.  A Fib New onset this admission. Started on anticoagulation this admission (1/7). Rate well-controlled. - continue rivaroxaban 20 mg - continue metoprolol tartrate 12.5 mg BID  Seborrheic dermatitis Noted on face, chronic. Resolved. - s/p ketoconazole cream x 7 days  Hypomagnesemia Mg mildly low 1.6 this AM. - magnesium oxide 400 mg BID  Alcohol withdrawal Resolved, s/p chlordiazepoxide taper. Mental status at baseline. Medically stable for discharge. - daily thiamine - appreciate TOC assistance with placement  HTN Normotensive to soft. HCTZ and amlodipine were discontinued this admission.  Depression/anxiety - sertraline 200 mg daily  Chronic low back pain - Tylenol prn - lidocaine patch prn - baclofen 10 mg TID prn  Other problems chronic and stable: Macrocytic anemia Iron deficiency Allergic rhinitis  FEN/GI: regular diet PPx: rivaroxaban  Disposition: med-surg, SNF when bed available  Subjective:  NAOE. No concerns this AM. Walked around with nurse yesterday. Encouraged patient to walk as much as possible.  Objective: Temp:  [97.7 F (36.5 C)-97.9 F (36.6 C)] 97.9 F (36.6 C) (01/18 0346) Pulse Rate:  [75-81] 81 (01/18 0346) Resp:  [16-18] 18 (01/18 0346) BP: (95-109)/(71-78) 95/71 (01/18 0346) SpO2:  [97 %] 97 % (01/18 0346) Weight:  [62.4 kg] 62.4 kg (01/18  0034) Physical Exam: General: Older male, resting comfortably in bed, NAD Cardiovascular: RRR, no murmurs Respiratory: CTAB Extremities: WWP, no edema  Laboratory: Recent Labs  Lab 08/06/20 0401  WBC 8.4  HGB 11.2*  HCT 34.5*  PLT 414*   Recent Labs  Lab 08/06/20 0401  NA 135  K 3.8  CL 102  CO2 25  BUN 11  CREATININE 0.66  CALCIUM 9.3  GLUCOSE 83     Imaging/Diagnostic Tests: No new imaging.  Zola Button, MD 08/06/2020, 9:43 AM PGY-1, [place] Intern pager: [phone], text pages welcome

## 2020-08-06 NOTE — Progress Notes (Addendum)
Paged Dr. Erin Hearing with following message via Amion:    [phone] ML patient blood pressure 94/78 this am, HR 90. Would you like the metoprolol held or dose changed? thank you, Val [phone]  Doristine Mango, DO responded to hold metoprolol dose at this time. Will continue to monitor.

## 2020-08-07 DIAGNOSIS — F1020 Alcohol dependence, uncomplicated: Secondary | ICD-10-CM | POA: Diagnosis not present

## 2020-08-07 DIAGNOSIS — E8809 Other disorders of plasma-protein metabolism, not elsewhere classified: Secondary | ICD-10-CM | POA: Diagnosis not present

## 2020-08-07 DIAGNOSIS — D539 Nutritional anemia, unspecified: Secondary | ICD-10-CM | POA: Diagnosis not present

## 2020-08-07 DIAGNOSIS — E860 Dehydration: Secondary | ICD-10-CM | POA: Diagnosis not present

## 2020-08-07 MED ORDER — METOPROLOL SUCCINATE ER 25 MG PO TB24
12.5000 mg | ORAL_TABLET | Freq: Every day | ORAL | Status: DC
  Administered 2020-08-07 – 2020-08-09 (×3): 12.5 mg via ORAL
  Filled 2020-08-07 (×3): qty 1

## 2020-08-07 NOTE — Progress Notes (Addendum)
Physical Therapy Treatment Patient Details Name: Anthony [name] MRN: [id number] DOB: 20-May-1956 Today's Date: 08/07/2020    History of Present Illness Anthony [name] is Anthony 65 y.o. male being treated for acute alcohol withdrawal . PMH is significant for alcohol use disorder with prior withdrawal and DTs, HFpEF (last Echo 2019), HTN, chronic low back pain, anxiety, depression, and h/o right pulmonary embolus.    PT Comments    Pt with improving mobility and balance. Likely pt not far from his baseline with mobility. Agree with plan to look at other DC options rather than SNF since pt has improved. Possible HHPT if available at DC location.    Follow Up Recommendations  Home health PT     Equipment Recommendations  None recommended by PT    Recommendations for Other Services       Precautions / Restrictions Precautions Precautions: Fall Restrictions Weight Bearing Restrictions: No    Mobility  Bed Mobility Overal bed mobility: Modified Independent                Transfers Overall transfer level: Modified independent Equipment used: None Transfers: Sit to/from Stand Sit to Stand: Modified independent (Device/Increase time)            Ambulation/Gait Ambulation/Gait assistance: Supervision Gait Distance (Feet): 450 Feet Assistive device: None Gait Pattern/deviations: Step-through pattern;Decreased stride length;Drifts right/left Gait velocity: Pt demonstrated ability to change speed up/down on requests Gait velocity interpretation: >2.62 ft/sec, indicative of community ambulatory General Gait Details: Pt with slight unsteadiness with higher level gait activities but no overt loss of balance   Stairs             Wheelchair Mobility    Modified Rankin (Stroke Patients Only)       Balance Overall balance assessment: Needs assistance Sitting-balance support: No upper extremity supported;Feet supported Sitting balance-Leahy Scale: Good     Standing  balance support: No upper extremity supported;During functional activity Standing balance-Leahy Scale: Good Standing balance comment: Pt holding on railing occasionally, but no need due to increased balance since last session.             High level balance activites: Backward walking;Direction changes;Turns;Sudden stops;Head turns High Level Balance Comments: Pt performed all of the above without loss of balance            Cognition Arousal/Alertness: Awake/alert Behavior During Therapy: WFL for tasks assessed/performed Overall Cognitive Status: Impaired/Different from baseline Area of Impairment: Orientation;Attention;Memory                 Orientation Level: Disoriented to;Time Current Attention Level: Selective Memory: Decreased short-term memory   Safety/Judgement: Decreased awareness of safety Awareness: Intellectual  General Comments: Pt easily distracted but able to follow all commands      Exercises      General Comments        Pertinent Vitals/Pain Pain Assessment: No/denies pain Faces Pain Scale: No hurt Pain Intervention(s): Premedicated before session (Pt took tylenol at beginning of session)    Home Living                      Prior Function            PT Goals (current goals can now be found in the care plan section) Acute Rehab PT Goals Patient Stated Goal: "get stronger." Progress towards PT goals: Progressing toward goals    Frequency    Min 3X/week      PT Plan Discharge plan  needs to be updated;Frequency needs to be updated    Co-evaluation              AM-PAC PT "6 Clicks" Mobility   Outcome Measure  Help needed turning from your back to your side while in Anthony flat bed without using bedrails?: None Help needed moving from lying on your back to sitting on the side of Anthony flat bed without using bedrails?: None Help needed moving to and from Anthony bed to Anthony chair (including Anthony wheelchair)?: None Help needed  standing up from Anthony chair using your arms (e.g., wheelchair or bedside chair)?: None Help needed to walk in hospital room?: None Help needed climbing 3-5 steps with Anthony railing? : Anthony [name] 6 Click Score: 23    End of Session   Activity Tolerance: Patient tolerated treatment well Patient left: in bed;with call bell/phone within reach   PT Visit Diagnosis: Unsteadiness on feet (R26.81)     Time: 9798-9211 PT Time Calculation (min) (ACUTE ONLY): 11 min  Charges:  $Gait Training: 8-22 mins                     [place] Pager [phone] Office [place] 08/07/2020, 4:44 PM

## 2020-08-07 NOTE — TOC Progression Note (Signed)
Transition of Care [facility]) - Progression Note    Patient Details  Name: Anthony [name] MRN: [id number] Date of Birth: [birth year]  Transition of Care [facility]) CM/SW [place], [place] Phone Number: 08/07/2020, 4:17 PM  Clinical Narrative:    Pt is now walking 400 feet and may be able to live independently. CSW spoke with pt at length about possible living options. Pt is extremely vague and tangential. He speaks about going to court with [employer] where he was evicted and getting access back. CSW explained that pt's unit had been padlocked and discussed more short term plan for shelter/housing. CSW discussed shelter options but pt began to talk about sleeping on the bus and using 24/7 businesses to stay sheltered. Pt does discuss a hotel as an option. He has stayed at [facility] the past. CSW discusses plan to d/c pt [facility] to get his belongings and then for him to stay in [facility]. Pt is more agreeable to this but expresses concerns about making sure his debit card is with his belongings. CSW will call [facility] to discuss pt's belonging. Pt to seek hotel options.   CSW called Golda Acre [phone])  At [facility]. She stated pt would be able to come get his belongings but stated they would not be able to go in his apartment to check if he had a card with his belongings. MD did notify CSW that pt's brother may be able to assist financially.     Expected Discharge Plan: [place] Barriers to Discharge: Continued Medical Work up  Expected Discharge Plan and Services Expected Discharge Plan: [place] arrangements for the past 2 months: Apartment                                       Social Determinants of Health (SDOH) Interventions    Readmission Risk Interventions No flowsheet data found.

## 2020-08-07 NOTE — Progress Notes (Signed)
Family Medicine Teaching Service Daily Progress Note Intern Pager: [phone]  Patient name: Anthony [name] Medical record number: [id number] Date of birth: Mar 11, 1956 Age: 65 y.o. Gender: male  Primary Care Provider: Carollee Leitz, MD Consultants: none   Code Status: Full Code  Pt Overview and Major Events to Date:  1/2 Admitted  Assessment and Plan: [name] Floor is a 64 y.o. male who presented with acute alcohol withdrawal. PMHx significant for: AUD with history of DTs, HFpEF, HTN, chronic back pain, anxiety, depression, right PE. Medically stable for discharge to SNF when bed available.  A Fib New onset this admission. Started on anticoagulation this admission (1/7). Rate well-controlled. AM metoprolol tartrate dose held yesterday due to soft BP. - continue rivaroxaban 20 mg - switch to metoprolol succinate 12.5 mg BID  Hypomagnesemia - magnesium oxide 400 mg BID  Alcohol withdrawal Resolved, s/p chlordiazepoxide taper. Mental status at baseline. Medically stable for discharge. - daily thiamine - appreciate TOC assistance with placement  HTN Normotensive to soft. HCTZ and amlodipine were discontinued this admission. - metoprolol succinate as above  Depression/anxiety - sertraline 200 mg daily  Chronic low back pain - Tylenol prn - lidocaine patch prn - baclofen 10 mg TID prn  Other problems chronic and stable: Macrocytic anemia Iron deficiency Allergic rhinitis  FEN/GI: regular diet PPx: rivaroxaban  Disposition: med-surg, SNF when bed available  Subjective:  NAOE. No concerns this AM. He plans on talking to his insurance company regarding housing/placement.  Objective: Temp:  [97.8 F (36.6 C)-98.1 F (36.7 C)] 98.1 F (36.7 C) (01/19 0457) Pulse Rate:  [72-90] 72 (01/19 0457) Resp:  [14-18] 14 (01/19 0457) BP: (94-118)/(69-78) 98/69 (01/19 0457) SpO2:  [94 %-99 %] 94 % (01/19 0457) Weight:  [62.9 kg] 62.9 kg (01/19 0410) Physical Exam: General: Older  male, resting comfortably in bed, NAD Cardiovascular: RRR, no murmurs Respiratory: CTAB Extremities: WWP, no edema  Laboratory: Recent Labs  Lab 08/06/20 0401  WBC 8.4  HGB 11.2*  HCT 34.5*  PLT 414*   Recent Labs  Lab 08/06/20 0401  NA 135  K 3.8  CL 102  CO2 25  BUN 11  CREATININE 0.66  CALCIUM 9.3  GLUCOSE 83     Imaging/Diagnostic Tests: No new imaging.  Zola Button, MD 08/07/2020, 7:25 AM PGY-1, [place] Intern pager: [phone], text pages welcome

## 2020-08-07 NOTE — Progress Notes (Signed)
Occupational Therapy Treatment Patient Details Name: Anthony [name] MRN: [id number] DOB: Nov 12, 1955 Today's Date: 08/07/2020    History of present illness Anthony [name] is a 65 y.o. male being treated for acute alcohol withdrawal . PMH is significant for alcohol use disorder with prior withdrawal and DTs, HFpEF (last Echo 2019), HTN, chronic low back pain, anxiety, depression, and h/o right pulmonary embolus.   OT comments  Pt increasing ability to care for self and to mobilize in room and out in hallway. Pt continues to have challenges with cognition and requires re-orientation, but appears to be baseline for cognitive appearance. Pt able to follow all commands while multi-tasking during walk ~125' with no AD and fair balance. Pt requires cues to stay on task, but no incontinence issues this session. Pt able to sequence through grooming tasks at sink and simulate donning pants. Pt speaking with CSW about being able to stay with someone vs d/c to shelter as SNF is not an option. OT continuing to work with pt on higher level cognitive tasks. OT following acutely.   Follow Up Recommendations  SNF;Other (comment) (shelter)    Equipment Recommendations  None recommended by OT    Recommendations for Other Services      Precautions / Restrictions Precautions Precautions: Fall Restrictions Weight Bearing Restrictions: No       Mobility Bed Mobility Overal bed mobility: Modified Independent                Transfers Overall transfer level: Needs assistance Equipment used: None Transfers: Sit to/from Stand Sit to Stand: Supervision              Balance Overall balance assessment: Needs assistance Sitting-balance support: No upper extremity supported;Feet supported Sitting balance-Leahy Scale: Good     Standing balance support: No upper extremity supported;During functional activity Standing balance-Leahy Scale: Fair Standing balance comment: Pt holding on railing  occasionally, but no need due to increased balance since last session.                           ADL either performed or assessed with clinical judgement   ADL Overall ADL's : Needs assistance/impaired     Grooming: Supervision/safety;Standing               Lower Body Dressing: Supervision/safety;Sitting/lateral leans;Sit to/from stand               Functional mobility during ADLs: Min guard;Supervision/safety General ADL Comments: Pt able to perform ADL tasks with increased time; pt performing ambulation task in hallway with no AD, supervisionA to minguardA no LOB as pt requesting to ambulate ~125'. pt stopping at sink for light grooming and simulating pulling pants to waist. Pt     Vision       Perception     Praxis      Cognition Arousal/Alertness: Awake/alert Behavior During Therapy: WFL for tasks assessed/performed Overall Cognitive Status: Impaired/Different from baseline Area of Impairment: Orientation;Attention;Problem solving;Memory                 Orientation Level: Disoriented to;Time Current Attention Level: Selective Memory: Decreased short-term memory   Safety/Judgement: Decreased awareness of safety Awareness: Intellectual Problem Solving: Requires verbal cues;Requires tactile cues General Comments: Pt requires re-orientation, but appears to be baseline for cognitive appearance. Pt able to follow all commands while multi-tasking. Pt requires cues to stay on task, but no incontinence issues this session. Pt able to sequence through grooming tasks  at sink.        Exercises     Shoulder Instructions       General Comments      Pertinent Vitals/ Pain       Pain Assessment: Faces Faces Pain Scale: No hurt Pain Intervention(s): Monitored during session  Home Living                                          Prior Functioning/Environment              Frequency  Min 2X/week        Progress  Toward Goals  OT Goals(current goals can now be found in the care plan section)  Progress towards OT goals: Progressing toward goals  Acute Rehab OT Goals Patient Stated Goal: "get stronger." OT Goal Formulation: With patient Time For Goal Achievement: 08/21/20 Potential to Achieve Goals: Good ADL Goals Pt Will Perform Grooming: with modified independence;standing Pt Will Perform Lower Body Dressing: with modified independence;sit to/from stand Pt Will Transfer to Toilet: with modified independence;ambulating Pt Will Perform Toileting - Clothing Manipulation and hygiene: with modified independence Pt/caregiver will Perform Home Exercise Program: Increased strength;Both right and left upper extremity;With Supervision Additional ADL Goal #1: Pt will complete x10 mins of ADL tasks with 1 seated rest break with minguardA overall. Additional ADL Goal #2: Pt will perform higher level cognitive task with minimal cues to attend to task,  Plan Discharge plan needs to be updated    Co-evaluation                 AM-PAC OT "6 Clicks" Daily Activity     Outcome Measure   Help from another person eating meals?: None Help from another person taking care of personal grooming?: None Help from another person toileting, which includes using toliet, bedpan, or urinal?: A [place] Help from another person bathing (including washing, rinsing, drying)?: A [place] Help from another person to put on and taking off regular upper body clothing?: None Help from another person to put on and taking off regular lower body clothing?: None 6 Click Score: 22    End of Session    OT Visit Diagnosis: Unsteadiness on feet (R26.81);Other symptoms and signs involving cognitive function   Activity Tolerance Patient tolerated treatment well   Patient Left in bed;with call bell/phone within reach;with bed alarm set   Nurse Communication Mobility status        Time: 1530-1550 OT Time Calculation (min): 20  min  Charges: OT General Charges $OT Visit: 1 Visit OT Treatments $Therapeutic Activity: 8-22 mins  Jefferey Pica, OTR/L Acute Rehabilitation Services Pager: [phone] Office: [phone]     Zaxton Angerer C 08/07/2020, 4:10 PM

## 2020-08-07 NOTE — TOC Progression Note (Addendum)
Transition of Care [facility]) - Progression Note    Patient Details  Name: Anthony [name] MRN: [id number] Date of Birth: Nov 29, 1955  Transition of Care [facility]) CM/SW [place], [place] Phone Number: 08/07/2020, 9:26 AM  Clinical Narrative:     CSW contacted [facility]; still waiting on response from clinical reviewer.  CSW contacted [facility] [place] [facility] [place] facilities requesting to review pt.    9735: [facility] not able to offer.    CSW spoke with Maudie Mercury with [employer] facilities. All 4 ([place], [place], [place], [place]) have no available beds due to either covid outbreaks or being full.   Expected Discharge Plan: [place] Barriers to Discharge: Continued Medical Work up  Expected Discharge Plan and Services Expected Discharge Plan: [place] arrangements for the past 2 months: Apartment                                       Social Determinants of Health (SDOH) Interventions    Readmission Risk Interventions No flowsheet data found.

## 2020-08-08 ENCOUNTER — Other Ambulatory Visit (HOSPITAL_COMMUNITY): Payer: Self-pay | Admitting: Family Medicine

## 2020-08-08 DIAGNOSIS — F10231 Alcohol dependence with withdrawal delirium: Secondary | ICD-10-CM | POA: Diagnosis not present

## 2020-08-08 DIAGNOSIS — Z659 Problem related to unspecified psychosocial circumstances: Secondary | ICD-10-CM | POA: Diagnosis not present

## 2020-08-08 DIAGNOSIS — F10232 Alcohol dependence with withdrawal with perceptual disturbance: Secondary | ICD-10-CM | POA: Diagnosis not present

## 2020-08-08 DIAGNOSIS — E8809 Other disorders of plasma-protein metabolism, not elsewhere classified: Secondary | ICD-10-CM | POA: Diagnosis not present

## 2020-08-08 LAB — MAGNESIUM: Magnesium: 1.5 mg/dL — ABNORMAL LOW (ref 1.7–2.4)

## 2020-08-08 MED ORDER — SERTRALINE HCL 100 MG PO TABS: 200.0000 mg | ORAL_TABLET | Freq: Every day | ORAL | 0 refills | Status: DC

## 2020-08-08 MED ORDER — RIVAROXABAN 20 MG PO TABS: 20.0000 mg | ORAL_TABLET | Freq: Every day | ORAL | 0 refills | Status: DC

## 2020-08-08 MED ORDER — FERROUS SULFATE 325 (65 FE) MG PO TABS: 325.0000 mg | ORAL_TABLET | ORAL | 0 refills | Status: DC

## 2020-08-08 MED ORDER — MAGNESIUM OXIDE 400 (241.3 MG) MG PO TABS: 400.0000 mg | ORAL_TABLET | Freq: Two times a day (BID) | ORAL | 0 refills | Status: DC

## 2020-08-08 MED ORDER — METOPROLOL SUCCINATE ER 25 MG PO TB24: 12.5000 mg | ORAL_TABLET | Freq: Every day | ORAL | 0 refills | Status: DC

## 2020-08-08 MED FILL — SERTRALINE HCL 100 MG TAB: 100 | 30 days supply | Qty: 60 | Fill #0

## 2020-08-08 MED FILL — MAGNESIUM OXIDE 400 MG TABS: 400 | 30 days supply | Qty: 60 | Fill #0

## 2020-08-08 MED FILL — XARELTO 20 MG TABLET: 20 | 30 days supply | Qty: 30 | Fill #0

## 2020-08-08 MED FILL — METOPROLOL SUCCINATE ER 25: 25 | 30 days supply | Qty: 15 | Fill #0

## 2020-08-08 NOTE — Progress Notes (Signed)
Family Medicine Teaching Service Daily Progress Note Intern Pager: [phone]  Patient name: Erron Wengert Reen Medical record number: [id number] Date of birth: 01/14/1956 Age: 65 y.o. Gender: male  Primary Care Provider: Carollee Leitz, MD Consultants: none   Code Status: Full Code  Pt Overview and Major Events to Date:  1/2 Admitted  Assessment and Plan: Damyn A Scaduto is a 65 y.o. male who presented with acute alcohol withdrawal. PMHx significant for: AUD with history of DTs, HFpEF, HTN, chronic back pain, anxiety, depression, right PE. Medically stable for discharge.  A Fib New onset this admission. Started on anticoagulation this admission (1/7). Rate well-controlled. - continue rivaroxaban 20 mg - metoprolol succinate 12.5 mg BID  Hypomagnesemia - magnesium oxide 400 mg BID  Alcohol withdrawal Resolved, s/p chlordiazepoxide taper. Mental status at baseline. Medically stable for discharge. - daily thiamine - appreciate TOC assistance with placement  HTN Normotensive to soft. HCTZ and amlodipine were discontinued this admission. - metoprolol succinate as above  Depression/anxiety - sertraline 200 mg daily  Chronic low back pain - Tylenol prn - lidocaine patch prn - baclofen 10 mg TID prn  Other problems chronic and stable: Macrocytic anemia Iron deficiency Allergic rhinitis  FEN/GI: regular diet PPx: rivaroxaban  Disposition: med-surg, no longer needing SNF, plan to discharge tomorrow into community (brother has paid for hotel)  Subjective:  NAOE. No concerns this AM. He is planning to reach out to his lawyer about his eviction. Discussed disposition, patient states he has stayed at a homeless shelter in the past but does not want to do so because of the rules.  Objective: Temp:  [97.3 F (36.3 C)-98.1 F (36.7 C)] 97.3 F (36.3 C) (01/20 0441) Pulse Rate:  [75-83] 75 (01/20 0441) Resp:  [15-20] 15 (01/20 0441) BP: (97-114)/(67-81) 110/81 (01/20 0441) SpO2:  [97  %-99 %] 99 % (01/20 0441) Weight:  [62.5 kg] 62.5 kg (01/20 0441) Physical Exam: General: Older male, resting comfortably in bed, NAD Cardiovascular: RRR, no murmurs Respiratory: CTAB Extremities: WWP, no edema  Laboratory: Recent Labs  Lab 08/06/20 0401  WBC 8.4  HGB 11.2*  HCT 34.5*  PLT 414*   Recent Labs  Lab 08/06/20 0401  NA 135  K 3.8  CL 102  CO2 25  BUN 11  CREATININE 0.66  CALCIUM 9.3  GLUCOSE 83     Imaging/Diagnostic Tests: No new imaging.  Zola Button, MD 08/08/2020, 7:35 AM PGY-1, [place] Intern pager: [phone], text pages welcome

## 2020-08-08 NOTE — TOC Progression Note (Signed)
Transition of Care [facility]) - Progression Note    Patient Details  Name: Anthony [name] MRN: [id number] Date of Birth: [birth year]  Transition of Care [facility]) CM/SW [place], [place] Phone Number: 08/08/2020, 1:36 PM  Clinical Narrative:     CSW called pt's brother who lives in [place]. CSW updated pt's brother on plan. Plan is for pt to be transported to [facility] to get his small personal belongings. Pt will then go to a hotel and from there work on arranging permenant housing and renting a truck to get his belongings from [place]. Pt brother is agreeable to this plan and to paying for pt's hotel.   CSW met with pt bedside and called pt's brother over the phone. Pt and brother both agreeable to plan. Brother will call [employer] and arrange for pt to stay there. Pt also has a friend named Anthony [name] [phone]) who may be able to help pt with transport and using a phone.   Pt's brother called CSW back and stated [facility] does not have any vacancies so he arranged 1 week at [employer] on [address] in [place]. His check in time is at one.   Pt's Friend Anthony [name] called CSW and confirmed he could pick pt up at 11am on 08/09/20. Pt to D/C tomorrow.    Expected Discharge Plan: [place] Barriers to Discharge: Continued Medical Work up  Expected Discharge Plan and Services Expected Discharge Plan: [place] arrangements for the past 2 months: Apartment                                       Social Determinants of Health (SDOH) Interventions    Readmission Risk Interventions No flowsheet data found.

## 2020-08-08 NOTE — Discharge Summary (Addendum)
[facility] Discharge Summary  Patient name: Anthony [name] [place] Medical record number: [id number] Date of birth: [birth year] Age: 65 y.o. Gender: male Date of Admission: 07/21/2020  Date of Discharge: 08/09/2020  Admitting Physician: Blane Ohara McDiarmid, MD  Primary Care Provider: Carollee Leitz, MD Consultants: none  Indication for Hospitalization: Alcohol withdrawal  Discharge Diagnoses/Problem List:  Principal Problem:   Alcohol withdrawal ([place]) Active Problems:   Alcohol use disorder, severe, dependence ([place])   Poor social situation   Macrocytic anemia   Rhabdomyolysis   Cerebral ventriculomegaly   Hemoglobin decreased   History of delirium tremons   Hypoalbuminemia due to protein-calorie malnutrition ([place])   Hypomagnesemia   Seborrheic dermatitis  Disposition: home  Discharge Condition: Stable  Discharge Exam:  General: Resting comfortably in bed, NAD Cardiovascular: RRR, no murmurs Respiratory: CTAB Abdomen: soft, non-tender, +BS Extremities: WWP, no edema  Neuro: alert, interactive, full strength in all extremities   Brief Hospital Course:  Anthony [name] is a 65 y.o. male with a history of AUD with history of DTs, HFpEF, HTN, chronic back pain, anxiety, depression, right PE who presented with acute alcohol withdrawal. Hospital course outlined by problem below:  Alcohol withdrawal Patient initially found unresponsive due to alcohol withdrawal. He was treated with chlordiazepoxide taper and lorazepam per CIWA protocol. Alcohol withdrawal resolved prior to discharge and CIWA monitoring was discontinued. Patient expressed desire to continue sobriety after discharge.  pAF He was noted on EKG to have a new onset paroxysmal episode of A Fib. Patient remained asymptomatic. Given his history of unprovoked PE and previous tolerance of xarelto, he was started on rivaroxaban for anticoagulation. Echo was obtained and was essentially unchanged from prior study 2  years ago. Metoprolol succinate was added for rate control and was well controlled throughout prolonged admission.  HTN He was initially continued on his home medications, but his amlodipine and HCTZ were discontinued due to soft BP after metoprolol was added. His BP remained normotensive to soft throughout admission.  Hypomagnesemia  malnutrition  Magnesium was persistently low throughout admission so he was started on magnesium oxide BID. Mg 1.5 prior to discharge. Patient refused IV magnesium on day of discharge.  Seborrheic dermatitis Noted on face, chronic for years. Resolved with topical ketoconazole.  Vaccination He received his COVID Booster vaccine while in hospital.   Issues for Follow Up:  New onset A Fib started on rivaroxaban and metoprolol succinate. Housing insecurity, may benefit from CCM referral. Needs magnesium checked in 2-3 days, patient refused replacement on date of discharge.  Significant Procedures: none  Significant Labs and Imaging:  Recent Labs  Lab 08/06/20 0401  WBC 8.4  HGB 11.2*  HCT 34.5*  PLT 414*   Recent Labs  Lab 08/06/20 0401 08/08/20 0853  NA 135  --   K 3.8  --   CL 102  --   CO2 25  --   GLUCOSE 83  --   BUN 11  --   CREATININE 0.66  --   CALCIUM 9.3  --   MG 1.6* 1.5*      Results/Tests Pending at Time of Discharge: none  Discharge Medications:  Allergies as of 08/09/2020   No Known Allergies      Medication List     STOP taking these medications    acetaminophen 650 MG CR tablet Commonly known as: TYLENOL   amLODipine 5 MG tablet Commonly known as: NORVASC   b complex vitamins capsule   hydrochlorothiazide 25 MG  tablet Commonly known as: HYDRODIURIL   methocarbamol 750 MG tablet Commonly known as: ROBAXIN   multivitamin with minerals Tabs tablet       TAKE these medications    cetirizine 10 MG tablet Commonly known as: ZYRTEC Take 1 tablet (10 mg total) by mouth daily.   ferrous sulfate 325  (65 FE) MG tablet Take 1 tablet (325 mg total) by mouth 2 (two) times a week.   folic acid 1 MG tablet Commonly known as: FOLVITE Take 1 tablet (1 mg total) by mouth daily.   hydroxypropyl methylcellulose / hypromellose 2.5 % ophthalmic solution Commonly known as: ISOPTO TEARS / GONIOVISC Place 1 drop into both eyes as needed for dry eyes.   magnesium oxide 400 (241.3 Mg) MG tablet Commonly known as: MAG-OX Take 1 tablet (400 mg total) by mouth 2 (two) times daily.   metoprolol succinate 25 MG 24 hr tablet Commonly known as: TOPROL-XL Take 0.5 tablets (12.5 mg total) by mouth daily.   rivaroxaban 20 MG Tabs tablet Commonly known as: XARELTO Take 1 tablet (20 mg total) by mouth daily with supper.   sertraline 100 MG tablet Commonly known as: ZOLOFT Take 2 tablets (200 mg total) by mouth daily.   thiamine 100 MG tablet Take 1 tablet (100 mg total) by mouth daily.        Discharge Instructions: Please refer to Patient Instructions section of EMR for full details.  Patient was counseled important signs and symptoms that should prompt return to medical care, changes in medications, dietary instructions, activity restrictions, and follow up appointments.   Follow-Up Appointments:  Follow-up Information     Carollee Leitz, MD. Go on 08/19/2020.   Specialty: Family Medicine Why: 8:30 AM. Contact information: [address] [place] [address] [phone]                 Zola Button, MD 08/09/2020, 10:53 AM PGY-1, [place]    I have seen and examined this patient.     I have discussed the findings and exam with the intern and agree with the above note, which I have edited appropriately. I helped develop the management plan that is described in the resident's note, and I agree with the content.   Doristine Mango, DO PGY-3 Family Medicine Resident

## 2020-08-09 ENCOUNTER — Other Ambulatory Visit (HOSPITAL_COMMUNITY): Payer: Self-pay | Admitting: Family Medicine

## 2020-08-09 DIAGNOSIS — F411 Generalized anxiety disorder: Secondary | ICD-10-CM

## 2020-08-09 MED ORDER — METOPROLOL SUCCINATE ER 25 MG PO TB24: 12.5000 mg | ORAL_TABLET | Freq: Every day | ORAL | 0 refills | Status: DC

## 2020-08-09 MED ORDER — MAGNESIUM OXIDE 400 (241.3 MG) MG PO TABS: 400.0000 mg | ORAL_TABLET | Freq: Two times a day (BID) | ORAL | 0 refills | Status: DC

## 2020-08-09 MED ORDER — THIAMINE HCL 100 MG PO TABS: 100.0000 mg | ORAL_TABLET | Freq: Every day | ORAL | 0 refills | Status: DC

## 2020-08-09 MED ORDER — MAGNESIUM SULFATE 2 GM/50ML IV SOLN: 2.0000 g | Freq: Once | INTRAVENOUS | Status: DC

## 2020-08-09 MED ORDER — SERTRALINE HCL 100 MG PO TABS: 200.0000 mg | ORAL_TABLET | Freq: Every day | ORAL | 0 refills | Status: DC

## 2020-08-09 MED ORDER — FOLIC ACID 1 MG PO TABS: 1.0000 mg | ORAL_TABLET | Freq: Every day | ORAL | 0 refills | Status: DC

## 2020-08-09 MED FILL — FERROUS SULFATE 325 MG TAB: 325 (65 FE) | 28 days supply | Qty: 9 | Fill #0

## 2020-08-09 MED FILL — FOLIC ACID 1 MG TABS: 1 | 30 days supply | Qty: 30 | Fill #0

## 2020-08-09 MED FILL — VITAMIN B-1 100 MG TABS: 100 | 90 days supply | Qty: 90 | Fill #0

## 2020-08-09 NOTE — Progress Notes (Signed)
Occupational Therapy Treatment and Discharge Patient Details Name: SALEH ULBRICH MRN: [id number] DOB: 06-07-1956 Today's Date: 08/09/2020    History of present illness Krishiv A Philson is a 65 y.o. male being treated for acute alcohol withdrawal . PMH is significant for alcohol use disorder with prior withdrawal and DTs, HFpEF (last Echo 2019), HTN, chronic low back pain, anxiety, depression, and h/o right pulmonary embolus.   OT comments  This 65 yo male seen today and is functioning at an independent level for basic ADLs. He is to D/C today, we will sign off.   Follow Up Recommendations  SNF;Other (comment) (shelter)    Equipment Recommendations  None recommended by OT       Precautions / Restrictions Precautions Precautions: Fall Restrictions Weight Bearing Restrictions: No       Mobility Bed Mobility Overal bed mobility: Independent                Transfers Overall transfer level: Independent Equipment used: None                  Balance Overall balance assessment: No apparent balance deficits (not formally assessed)                                         ADL either performed or assessed with clinical judgement   ADL Overall ADL's : Independent                                       General ADL Comments: Pt was able to get his shirt, put it on, button it up, put on his socks, ambulate to bathroom, get up and down from toilet without AD for any of the activities.     Vision Baseline Vision/History: Wears glasses Wears Glasses: Reading only Patient Visual Report: No change from baseline            Cognition Arousal/Alertness: Awake/alert Behavior During Therapy: WFL for tasks assessed/performed                                   General Comments: Seems on target cognitively when speaking with him; however he tells me last evening he was trying to wash out his clothes in the shower, left the water  running and forgot about it thus flooding the bathroom floor and part of his room floor                   Pertinent Vitals/ Pain       Pain Assessment: No/denies pain         Frequency  Min 2X/week        Progress Toward Goals  OT Goals(current goals can now be found in the care plan section)  Progress towards OT goals: Goals met/education completed, patient discharged from OT  Acute Rehab OT Goals Patient Stated Goal: to leave today OT Goal Formulation: With patient Time For Goal Achievement: 08/21/20 Potential to Achieve Goals: Good  Plan Discharge plan remains appropriate       AM-PAC OT "6 Clicks" Daily Activity     Outcome Measure   Help from another person eating meals?: None Help from another person taking care of personal grooming?: None Help from another person  toileting, which includes using toliet, bedpan, or urinal?: None Help from another person bathing (including washing, rinsing, drying)?: None Help from another person to put on and taking off regular upper body clothing?: None Help from another person to put on and taking off regular lower body clothing?: None 6 Click Score: 24    End of Session    OT Visit Diagnosis: Unsteadiness on feet (R26.81);Other symptoms and signs involving cognitive function   Activity Tolerance Patient tolerated treatment well   Patient Left  (in wheelchair with NT)           Time: 8638-1771 OT Time Calculation (min): 14 min  Charges: OT General Charges $OT Visit: 1 Visit OT Treatments $Self Care/Home Management : 8-22 mins  [place], OTR/L Acute [employer] Pager [phone] Office [phone]      Almon Register 08/09/2020, 11:39 AM

## 2020-08-09 NOTE — TOC Progression Note (Addendum)
Transition of Care [facility]) - Progression Note    Patient Details  Name: KANAN SOBEK MRN: [id number] Date of Birth: 1956/02/19  Transition of Care [facility]) CM/SW [place], [place] Phone Number: 08/09/2020, 11:11 AM  Clinical Narrative:     Confirmed plan with pt to discharge to [facility] to get his belongings and then to [facility] on [address] in [place] where his brother rented a room. Check in time is 3pm. Pt's friend Pilar Plate will pick him up from hospital and take him to [facility]. CSW provided clothing for pt. CSW provided list of housing and homelessness resources.   Expected Discharge Plan: [place] Barriers to Discharge: Continued Medical Work up  Expected Discharge Plan and Services Expected Discharge Plan: [place] arrangements for the past 2 months: Apartment Expected Discharge Date: 08/09/20                                     Social Determinants of Health (SDOH) Interventions    Readmission Risk Interventions No flowsheet data found.

## 2020-08-09 NOTE — Progress Notes (Signed)
D/C instructions given and reviewed. No questions asked but encouraged to call with any concerns. Waiting on ride to arrive.

## 2020-08-13 ENCOUNTER — Encounter (HOSPITAL_COMMUNITY): Payer: Self-pay

## 2020-08-13 ENCOUNTER — Inpatient Hospital Stay (HOSPITAL_COMMUNITY)
Admission: EM | Admit: 2020-08-13 | Discharge: 2020-10-09 | DRG: 871 | Disposition: A | Discharge status: Home / Self Care | Payer: Medicare Other | Attending: Family Medicine | Admitting: Family Medicine

## 2020-08-13 DIAGNOSIS — E876 Hypokalemia: Secondary | ICD-10-CM | POA: Diagnosis not present

## 2020-08-13 DIAGNOSIS — F411 Generalized anxiety disorder: Secondary | ICD-10-CM

## 2020-08-13 DIAGNOSIS — G928 Other toxic encephalopathy: Secondary | ICD-10-CM | POA: Diagnosis not present

## 2020-08-13 DIAGNOSIS — Z659 Problem related to unspecified psychosocial circumstances: Secondary | ICD-10-CM

## 2020-08-13 DIAGNOSIS — E871 Hypo-osmolality and hyponatremia: Secondary | ICD-10-CM | POA: Diagnosis not present

## 2020-08-13 DIAGNOSIS — Z809 Family history of malignant neoplasm, unspecified: Secondary | ICD-10-CM

## 2020-08-13 DIAGNOSIS — Z8249 Family history of ischemic heart disease and other diseases of the circulatory system: Secondary | ICD-10-CM

## 2020-08-13 DIAGNOSIS — Z86711 Personal history of pulmonary embolism: Secondary | ICD-10-CM

## 2020-08-13 DIAGNOSIS — I482 Chronic atrial fibrillation, unspecified: Secondary | ICD-10-CM | POA: Diagnosis not present

## 2020-08-13 DIAGNOSIS — R6520 Severe sepsis without septic shock: Secondary | ICD-10-CM

## 2020-08-13 DIAGNOSIS — I4891 Unspecified atrial fibrillation: Secondary | ICD-10-CM | POA: Diagnosis present

## 2020-08-13 DIAGNOSIS — R64 Cachexia: Secondary | ICD-10-CM | POA: Diagnosis not present

## 2020-08-13 DIAGNOSIS — Z7901 Long term (current) use of anticoagulants: Secondary | ICD-10-CM

## 2020-08-13 DIAGNOSIS — R000 Tachycardia, unspecified: Secondary | ICD-10-CM | POA: Diagnosis not present

## 2020-08-13 DIAGNOSIS — N390 Urinary tract infection, site not specified: Secondary | ICD-10-CM | POA: Diagnosis not present

## 2020-08-13 DIAGNOSIS — Z6820 Body mass index (BMI) 20.0-20.9, adult: Secondary | ICD-10-CM | POA: Diagnosis not present

## 2020-08-13 DIAGNOSIS — L853 Xerosis cutis: Secondary | ICD-10-CM | POA: Diagnosis not present

## 2020-08-13 DIAGNOSIS — E43 Unspecified severe protein-calorie malnutrition: Secondary | ICD-10-CM | POA: Insufficient documentation

## 2020-08-13 DIAGNOSIS — R7401 Elevation of levels of liver transaminase levels: Secondary | ICD-10-CM | POA: Diagnosis not present

## 2020-08-13 DIAGNOSIS — Z609 Problem related to social environment, unspecified: Secondary | ICD-10-CM

## 2020-08-13 DIAGNOSIS — Z9114 Patient's other noncompliance with medication regimen: Secondary | ICD-10-CM

## 2020-08-13 DIAGNOSIS — Z1611 Resistance to penicillins: Secondary | ICD-10-CM | POA: Diagnosis not present

## 2020-08-13 DIAGNOSIS — A419 Sepsis, unspecified organism: Secondary | ICD-10-CM

## 2020-08-13 DIAGNOSIS — A4151 Sepsis due to Escherichia coli [E. coli]: Secondary | ICD-10-CM | POA: Diagnosis not present

## 2020-08-13 DIAGNOSIS — G894 Chronic pain syndrome: Secondary | ICD-10-CM | POA: Diagnosis present

## 2020-08-13 DIAGNOSIS — I5032 Chronic diastolic (congestive) heart failure: Secondary | ICD-10-CM | POA: Diagnosis present

## 2020-08-13 DIAGNOSIS — R4189 Other symptoms and signs involving cognitive functions and awareness: Secondary | ICD-10-CM | POA: Diagnosis present

## 2020-08-13 DIAGNOSIS — F17290 Nicotine dependence, other tobacco product, uncomplicated: Secondary | ICD-10-CM | POA: Diagnosis not present

## 2020-08-13 DIAGNOSIS — F329 Major depressive disorder, single episode, unspecified: Secondary | ICD-10-CM | POA: Diagnosis not present

## 2020-08-13 DIAGNOSIS — R238 Other skin changes: Secondary | ICD-10-CM | POA: Diagnosis not present

## 2020-08-13 DIAGNOSIS — Z85828 Personal history of other malignant neoplasm of skin: Secondary | ICD-10-CM

## 2020-08-13 DIAGNOSIS — L89151 Pressure ulcer of sacral region, stage 1: Secondary | ICD-10-CM | POA: Diagnosis present

## 2020-08-13 DIAGNOSIS — Z818 Family history of other mental and behavioral disorders: Secondary | ICD-10-CM

## 2020-08-13 DIAGNOSIS — R0609 Other forms of dyspnea: Secondary | ICD-10-CM

## 2020-08-13 DIAGNOSIS — Z823 Family history of stroke: Secondary | ICD-10-CM

## 2020-08-13 DIAGNOSIS — Z5900 Homelessness unspecified: Secondary | ICD-10-CM

## 2020-08-13 DIAGNOSIS — I480 Paroxysmal atrial fibrillation: Secondary | ICD-10-CM | POA: Diagnosis not present

## 2020-08-13 DIAGNOSIS — I2510 Atherosclerotic heart disease of native coronary artery without angina pectoris: Secondary | ICD-10-CM | POA: Diagnosis present

## 2020-08-13 DIAGNOSIS — Z79899 Other long term (current) drug therapy: Secondary | ICD-10-CM

## 2020-08-13 DIAGNOSIS — J84116 Cryptogenic organizing pneumonia: Secondary | ICD-10-CM | POA: Diagnosis not present

## 2020-08-13 DIAGNOSIS — Z20822 Contact with and (suspected) exposure to covid-19: Secondary | ICD-10-CM | POA: Diagnosis present

## 2020-08-13 DIAGNOSIS — E8809 Other disorders of plasma-protein metabolism, not elsewhere classified: Secondary | ICD-10-CM | POA: Diagnosis not present

## 2020-08-13 DIAGNOSIS — I110 Hypertensive heart disease with heart failure: Secondary | ICD-10-CM | POA: Diagnosis present

## 2020-08-13 DIAGNOSIS — Y95 Nosocomial condition: Secondary | ICD-10-CM | POA: Diagnosis not present

## 2020-08-13 DIAGNOSIS — R0600 Dyspnea, unspecified: Secondary | ICD-10-CM

## 2020-08-13 DIAGNOSIS — K449 Diaphragmatic hernia without obstruction or gangrene: Secondary | ICD-10-CM | POA: Diagnosis present

## 2020-08-13 DIAGNOSIS — R059 Cough, unspecified: Secondary | ICD-10-CM

## 2020-08-13 DIAGNOSIS — Z23 Encounter for immunization: Secondary | ICD-10-CM | POA: Diagnosis present

## 2020-08-13 DIAGNOSIS — E785 Hyperlipidemia, unspecified: Secondary | ICD-10-CM | POA: Diagnosis present

## 2020-08-13 DIAGNOSIS — L8990 Pressure ulcer of unspecified site, unspecified stage: Secondary | ICD-10-CM | POA: Insufficient documentation

## 2020-08-13 DIAGNOSIS — R54 Age-related physical debility: Secondary | ICD-10-CM | POA: Diagnosis present

## 2020-08-13 DIAGNOSIS — F1020 Alcohol dependence, uncomplicated: Secondary | ICD-10-CM

## 2020-08-13 DIAGNOSIS — D509 Iron deficiency anemia, unspecified: Secondary | ICD-10-CM | POA: Diagnosis not present

## 2020-08-13 DIAGNOSIS — R609 Edema, unspecified: Secondary | ICD-10-CM

## 2020-08-13 LAB — URINALYSIS, ROUTINE W REFLEX MICROSCOPIC
Bilirubin Urine: NEGATIVE
Glucose, UA: NEGATIVE mg/dL
Ketones, ur: 5 mg/dL — AB
Nitrite: NEGATIVE
Protein, ur: 100 mg/dL — AB
Specific Gravity, Urine: 1.023 (ref 1.005–1.030)
WBC, UA: >50 WBC/hpf — ABNORMAL HIGH (ref 0–5)
pH: 5 (ref 5.0–8.0)

## 2020-08-13 LAB — ETHANOL: Alcohol, Ethyl (B): <10 mg/dL (ref <10)

## 2020-08-13 LAB — RAPID URINE DRUG SCREEN, HOSP PERFORMED
Amphetamines: NOT DETECTED
Barbiturates: NOT DETECTED
Benzodiazepines: POSITIVE — AB
Cocaine: NOT DETECTED
Opiates: NOT DETECTED
Tetrahydrocannabinol: NOT DETECTED

## 2020-08-13 LAB — CBC WITH DIFFERENTIAL/PLATELET
Abs Immature Granulocytes: 0.11 10*3/uL — ABNORMAL HIGH (ref 0.00–0.07)
Basophils Absolute: 0 10*3/uL (ref 0.0–0.1)
Basophils Relative: 0 %
Eosinophils Absolute: 0 10*3/uL (ref 0.0–0.5)
Eosinophils Relative: 0 %
HCT: 34.1 % — ABNORMAL LOW (ref 39.0–52.0)
Hemoglobin: 10.9 g/dL — ABNORMAL LOW (ref 13.0–17.0)
Immature Granulocytes: 1 %
Lymphocytes Relative: 11 %
Lymphs Abs: 1.3 10*3/uL (ref 0.7–4.0)
MCH: 34.8 pg — ABNORMAL HIGH (ref 26.0–34.0)
MCHC: 32 g/dL (ref 30.0–36.0)
MCV: 108.9 fL — ABNORMAL HIGH (ref 80.0–100.0)
Monocytes Absolute: 1.2 10*3/uL — ABNORMAL HIGH (ref 0.1–1.0)
Monocytes Relative: 10 %
Neutro Abs: 9.4 10*3/uL — ABNORMAL HIGH (ref 1.7–7.7)
Neutrophils Relative %: 78 %
Platelets: 299 10*3/uL (ref 150–400)
RBC: 3.13 MIL/uL — ABNORMAL LOW (ref 4.22–5.81)
RDW: 12.6 % (ref 11.5–15.5)
WBC: 12 10*3/uL — ABNORMAL HIGH (ref 4.0–10.5)
nRBC: 0 % (ref 0.0–0.2)

## 2020-08-13 LAB — LACTIC ACID, PLASMA: Lactic Acid, Venous: 2.4 mmol/L (ref 0.5–1.9)

## 2020-08-13 LAB — MAGNESIUM: Magnesium: 1.7 mg/dL (ref 1.7–2.4)

## 2020-08-13 LAB — COMPREHENSIVE METABOLIC PANEL
ALT: 30 U/L (ref 0–44)
AST: 45 U/L — ABNORMAL HIGH (ref 15–41)
Albumin: 2.4 g/dL — ABNORMAL LOW (ref 3.5–5.0)
Alkaline Phosphatase: 84 U/L (ref 38–126)
Anion gap: 12 (ref 5–15)
BUN: 34 mg/dL — ABNORMAL HIGH (ref 8–23)
CO2: 17 mmol/L — ABNORMAL LOW (ref 22–32)
Calcium: 9 mg/dL (ref 8.9–10.3)
Chloride: 108 mmol/L (ref 98–111)
Creatinine, Ser: 0.87 mg/dL (ref 0.61–1.24)
GFR, Estimated: >60 mL/min (ref >60)
Glucose, Bld: 116 mg/dL — ABNORMAL HIGH (ref 70–99)
Potassium: 3.2 mmol/L — ABNORMAL LOW (ref 3.5–5.1)
Sodium: 137 mmol/L (ref 135–145)
Total Bilirubin: 1.4 mg/dL — ABNORMAL HIGH (ref 0.3–1.2)
Total Protein: 6.9 g/dL (ref 6.5–8.1)

## 2020-08-13 LAB — PHOSPHORUS: Phosphorus: 3.7 mg/dL (ref 2.5–4.6)

## 2020-08-13 MED ORDER — RIVAROXABAN 20 MG PO TABS: 20.0000 mg | ORAL_TABLET | Freq: Every day | ORAL | Status: DC

## 2020-08-13 MED ORDER — METOPROLOL TARTRATE 5 MG/5ML IV SOLN
5.0000 mg | Freq: Once | INTRAVENOUS | Status: AC
  Administered 2020-08-13: 5 mg via INTRAVENOUS
  Filled 2020-08-13: qty 5

## 2020-08-13 MED ORDER — MAGNESIUM OXIDE 400 (241.3 MG) MG PO TABS
400.0000 mg | ORAL_TABLET | Freq: Two times a day (BID) | ORAL | Status: DC
  Administered 2020-08-14 – 2020-08-22 (×19): 400 mg via ORAL
  Filled 2020-08-13 (×19): qty 1

## 2020-08-13 MED ORDER — SERTRALINE HCL 100 MG PO TABS
200.0000 mg | ORAL_TABLET | Freq: Every day | ORAL | Status: DC
  Administered 2020-08-14 – 2020-08-21 (×8): 200 mg via ORAL
  Filled 2020-08-13 (×8): qty 2

## 2020-08-13 MED ORDER — SODIUM CHLORIDE 0.9 % IV SOLN
1.0000 g | Freq: Once | INTRAVENOUS | Status: AC
  Administered 2020-08-14: 1 g via INTRAVENOUS
  Filled 2020-08-13: qty 10

## 2020-08-13 MED ORDER — FOLIC ACID 1 MG PO TABS
1.0000 mg | ORAL_TABLET | Freq: Every day | ORAL | Status: DC
  Administered 2020-08-14 – 2020-08-21 (×8): 1 mg via ORAL
  Filled 2020-08-13 (×8): qty 1

## 2020-08-13 MED ORDER — THIAMINE HCL 100 MG PO TABS
100.0000 mg | ORAL_TABLET | Freq: Every day | ORAL | Status: DC
Start: 2020-08-14 — End: 2020-08-21
  Administered 2020-08-14 – 2020-08-21 (×8): 100 mg via ORAL
  Filled 2020-08-13 (×8): qty 1

## 2020-08-13 MED ORDER — LACTATED RINGERS IV BOLUS
1000.0000 mL | Freq: Once | INTRAVENOUS | Status: AC
  Administered 2020-08-14: 1000 mL via INTRAVENOUS

## 2020-08-13 MED ORDER — THIAMINE HCL 100 MG/ML IJ SOLN
Freq: Once | INTRAVENOUS | Status: AC
  Filled 2020-08-13: qty 1000

## 2020-08-13 MED ORDER — FERROUS SULFATE 325 (65 FE) MG PO TABS
325.0000 mg | ORAL_TABLET | ORAL | Status: DC
  Administered 2020-08-15 – 2020-08-19 (×2): 325 mg via ORAL
  Filled 2020-08-13 (×3): qty 1

## 2020-08-13 MED ORDER — METOPROLOL SUCCINATE ER 25 MG PO TB24
25.0000 mg | ORAL_TABLET | Freq: Every day | ORAL | Status: DC
  Administered 2020-08-13 – 2020-08-15 (×3): 25 mg via ORAL
  Filled 2020-08-13 (×3): qty 1

## 2020-08-13 MED ORDER — HYPROMELLOSE (GONIOSCOPIC) 2.5 % OP SOLN
1.0000 [drp] | OPHTHALMIC | Status: DC | PRN
  Administered 2020-10-04: 1 [drp] via OPHTHALMIC
  Filled 2020-08-13: qty 15

## 2020-08-13 NOTE — ED Provider Notes (Signed)
[facility] EMERGENCY DEPARTMENT Provider Note   CSN: [id number] Arrival date & time: 08/13/20  1745     History Chief Complaint  Patient presents with  . SVT    Anthony [name] is a 65 y.o. male.  HPI Patient reports he has not been drinking for 3 weeks.  Denies alcohol consumption since his discharge from the hospital 5 days ago he reports he is not taking any of his medications since discharge because he cannot get them.  Social work did a check on him today and found him in his motel room in the bed with feces and urine, slightly confused.  Heart rate was elevated rates of 210.  EMS administered 20 mg of Cardizem and a liter of fluids.  Patient arrives alert and oriented.  He reports he has not taken any of his medications since discharge.  Patient was contentious regarding his discharge planning.  Apparently, he does not think that his ultimate discharge plan was to the [facility] [address],  although this appears to be the case per EMR review.  Also, per his report he could not get his medications because no one would give them to him.    Past Medical History:  Diagnosis Date  . Actinic keratosis 11/27/2016  . Alcohol abuse with alcohol-induced mood disorder ([place]) 07/18/2018  . Alcohol use disorder, severe, dependence ([place]) 09/16/2006   05/22/2015 Argumentative, belligerent and verbally abusive to staff per his note from [place]   . Alcoholism ([place])   . Anxiety state 04/25/2018  . Chronic low back pain 09/16/2006   Followed by NS.  Per his chart from [place], he fell off two storyhouse roof while cleaning his brother's gutters and sustained compression fracture of L3, L4 and L5 and fracture of right transverse process at L3 and nondisplaced fracture of left glenoid rim many years ago. He had multiple imaging in [place] including Lumbar MRI in 2016 which showed moderate to severe spinal canal stenos  . Delirium tremens ([place]) 09/01/2017  . Depression   . Dry eye  11/23/2016  . Hiatal hernia 09/14/2016   Status Collis gastroplasty and Nissen's fundoplication on [id number] in [place]  . History of delirium tremons 07/22/2020   DTs during 10/2016 08/2017. And 12/2017 admissions   . History of pulmonary embolus (PE) 11/02/2016   Unprovoked 11/01/2016. Xarelto from 11/01/16 to 09/08/2017.  Marland Kitchen Hydrocele    Surgically corrected  . Hypertension   . Hypoalbuminemia due to protein-calorie malnutrition ([place]) 07/22/2020  . Lumbar compression fracture ([place])   . Multiple rib fractures 07/22/2019   Left rib details XR: left ribs demonstrate multiple remote rib  . Pancytopenia ([place])   . Rhabdomyolysis 07/22/2020  . Skin cancer    exciced 2017  . Tinea versicolor 06/08/2013  . Tobacco use disorder 07/22/2020  . Tooth infection 04/25/2018  . Trigger finger, acquired 11/18/2012  . Ulnar tunnel syndrome of right wrist 11/08/2012    Patient Active Problem List   Diagnosis Date Noted  . Atrial fibrillation with RVR ([place]) 08/13/2020  . Rhabdomyolysis 07/22/2020  . Cerebral ventriculomegaly 07/22/2020  . Hemoglobin decreased 07/22/2020  . History of delirium tremons 07/22/2020  . Hypoalbuminemia due to protein-calorie malnutrition ([place]) 07/22/2020  . Tobacco use disorder 07/22/2020  . Alcohol withdrawal delirium ([place])   . Dehydration   . Alcohol abuse with alcohol-induced mood disorder ([place]) 07/18/2018  . Depression 05/26/2018  . Need for immunization against influenza 04/25/2018  . Alcohol withdrawal ([place]) 09/01/2017  . Actinic keratosis 11/27/2016  .  Macrocytic anemia 11/23/2016  . History of pulmonary embolus (PE) 11/02/2016  . Hiatal hernia 09/14/2016  . Skin cancer 08/13/2016  . Poor social situation 06/09/2013  . Tinea versicolor 06/08/2013  . 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 HTN (hypertension) 04/11/2012  . Alcohol use disorder, severe, dependence ([place]) 09/16/2006    Past Surgical History:  Procedure Laterality Date  . CATARACT EXTRACTION     . HIATAL HERNIA REPAIR  2016  . HYDROCELE EXCISION / REPAIR  2010  . SKIN CANCER EXCISION Left    Excised 2017       Family History  Problem Relation Age of Onset  . Heart attack Father        died at 53 years  . Dementia Father   . Stroke Father   . Cancer Sister   . Colon cancer Neg Hx   . Colon polyps Neg Hx   . Esophageal cancer Neg Hx   . Stomach cancer Neg Hx   . Rectal cancer Neg Hx     Social History   Tobacco Use  . Smoking status: Never Smoker  . Smokeless tobacco: Current User    Types: Snuff  Vaping Use  . Vaping Use: Never used  Substance Use Topics  . Alcohol use: Yes    Alcohol/week: 43.0 standard drinks    Types: 42 Cans of beer, 1 Standard drinks or equivalent per week    Comment: daily use-6 beers daily and whiskey when he has it.  . Drug use: Yes    Types: Marijuana    Comment: occasional-none this year per pt    Home Medications Prior to Admission medications   Medication Sig Start Date End Date Taking? Authorizing Provider  cetirizine (ZYRTEC) 10 MG tablet Take 1 tablet (10 mg total) by mouth daily. Patient not taking: Reported on 07/24/2020 01/07/18   Raiford Noble Latif, DO  ferrous sulfate 325 (65 FE) MG tablet Take 1 tablet (325 mg total) by mouth 2 (two) times a week. 08/08/20   Zola Button, MD  folic acid (FOLVITE) 1 MG tablet Take 1 tablet (1 mg total) by mouth daily. 08/09/20   Lurline Del, DO  hydroxypropyl methylcellulose / hypromellose (ISOPTO TEARS / GONIOVISC) 2.5 % ophthalmic solution Place 1 drop into both eyes as needed for dry eyes. Patient not taking: Reported on 07/24/2020 01/07/18   Raiford Noble Latif, DO  magnesium oxide (MAG-OX) 400 (241.3 Mg) MG tablet Take 1 tablet (400 mg total) by mouth 2 (two) times daily. 08/09/20   Lurline Del, DO  metoprolol succinate (TOPROL-XL) 25 MG 24 hr tablet Take 0.5 tablets (12.5 mg total) by mouth daily. 08/09/20   Lurline Del, DO  rivaroxaban (XARELTO) 20 MG TABS tablet Take 1 tablet (20  mg total) by mouth daily with supper. 08/08/20   Zola Button, MD  sertraline (ZOLOFT) 100 MG tablet Take 2 tablets (200 mg total) by mouth daily. 08/09/20   Lurline Del, DO  thiamine 100 MG tablet Take 1 tablet (100 mg total) by mouth daily. 08/09/20   Lurline Del, DO    Allergies    Patient has no known allergies.  Review of Systems   Review of Systems 10 systems reviewed and negative except as per HPI Physical Exam Updated Vital Signs BP 121/86   Pulse (!) 40   Temp 99.3 F (37.4 C) (Oral)   Resp (!) 23   SpO2 (!) 55%   Physical Exam Constitutional:      Comments: Patient is alert.  No respiratory distress.  Supplemental oxygen removed and he is at 100% on room air.  Patient is thin.  HENT:     Head: Normocephalic and atraumatic.     Mouth/Throat:     Pharynx: Oropharynx is clear.  Eyes:     Extraocular Movements: Extraocular movements intact.  Cardiovascular:     Comments: Tachycardia irregularly irregular. Pulmonary:     Comments: No respiratory distress.  Speaking full sentences.  Lungs are clear Abdominal:     General: There is no distension.     Palpations: Abdomen is soft.     Tenderness: There is no abdominal tenderness. There is no guarding.  Musculoskeletal:     Comments: No peripheral edema.  Calves are soft nontender.  Skin:    General: Skin is warm and dry.  Neurological:     General: No focal deficit present.     ED Results / Procedures / Treatments   Labs (all labs ordered are listed, but only abnormal results are displayed) Labs Reviewed  COMPREHENSIVE METABOLIC PANEL - Abnormal; Notable for the following components:      Result Value   Potassium 3.2 (*)    CO2 17 (*)    Glucose, Bld 116 (*)    BUN 34 (*)    Albumin 2.4 (*)    AST 45 (*)    Total Bilirubin 1.4 (*)    All other components within normal limits  CBC WITH DIFFERENTIAL/PLATELET - Abnormal; Notable for the following components:   WBC 12.0 (*)    RBC 3.13 (*)    Hemoglobin  10.9 (*)    HCT 34.1 (*)    MCV 108.9 (*)    MCH 34.8 (*)    Neutro Abs 9.4 (*)    Monocytes Absolute 1.2 (*)    Abs Immature Granulocytes 0.11 (*)    All other components within normal limits  URINALYSIS, ROUTINE W REFLEX MICROSCOPIC - Abnormal; Notable for the following components:   Color, Urine AMBER (*)    APPearance HAZY (*)    Hgb urine dipstick SMALL (*)    Ketones, ur 5 (*)    Protein, ur 100 (*)    Leukocytes,Ua MODERATE (*)    WBC, UA >50 (*)    Bacteria, UA MANY (*)    All other components within normal limits  RAPID URINE DRUG SCREEN, HOSP PERFORMED - Abnormal; Notable for the following components:   Benzodiazepines POSITIVE (*)    All other components within normal limits  LACTIC ACID, PLASMA - Abnormal; Notable for the following components:   Lactic Acid, Venous 2.4 (*)    All other components within normal limits  URINE CULTURE  CULTURE, BLOOD (ROUTINE X 2)  CULTURE, BLOOD (ROUTINE X 2)  SARS CORONAVIRUS 2 (TAT 6-24 HRS)  ETHANOL  MAGNESIUM  PHOSPHORUS  LACTIC ACID, PLASMA  CBC  COMPREHENSIVE METABOLIC PANEL    EKG EKG is not importing from use.  Tachycardia, narrow complex, no acute ischemic changes.  Radiology No results found.  Procedures Procedures   Medications Ordered in ED Medications  metoprolol succinate (TOPROL-XL) 24 hr tablet 25 mg (25 mg Oral Given 08/13/20 1924)  cefTRIAXone (ROCEPHIN) 1 g in sodium chloride 0.9 % 100 mL IVPB (1 g Intravenous New Bag/Given 08/14/20 0002)  hydroxypropyl methylcellulose / hypromellose (ISOPTO TEARS / GONIOVISC) 2.5 % ophthalmic solution 1 drop (has no administration in time range)  ferrous sulfate tablet 325 mg (has no administration in time range)  rivaroxaban (XARELTO) tablet 20 mg (has  no administration in time range)  thiamine tablet 100 mg (has no administration in time range)  folic acid (FOLVITE) tablet 1 mg (has no administration in time range)  magnesium oxide (MAG-OX) tablet 400 mg (has no  administration in time range)  sertraline (ZOLOFT) tablet 200 mg (has no administration in time range)  metoprolol tartrate (LOPRESSOR) injection 5 mg (5 mg Intravenous Given 08/13/20 1926)  sodium chloride 0.9 % 1,000 mL with thiamine 604 mg, folic acid 1 mg, multivitamins adult 10 mL infusion ( Intravenous New Bag/Given 08/13/20 2114)  metoprolol tartrate (LOPRESSOR) injection 5 mg (5 mg Intravenous Given 08/13/20 2111)  lactated ringers bolus 1,000 mL (1,000 mLs Intravenous New Bag/Given 08/14/20 0003)    ED Course  I have reviewed the triage vital signs and the nursing notes.  Pertinent labs & imaging results that were available during my care of the patient were reviewed by me and considered in my medical decision making (see chart for details).    MDM Rules/Calculators/A&P                          Consult: Family medicine teaching service for admission.  Patient brought back to emergency department as outlined above.  At this time he does have rapid heart rate.  He was given Cardizem and fluids prior to arrival with heart rates persisting 120s to 140s.  Patient denies any alcohol use.  At this time withdrawal does not appear to be the issue.  Patient denies having had any medication since his discharge from the hospital.  At this time administering his home dose of metoprolol plus IV metoprolol.  Continued hydration.  UA is grossly positive for UTI.  At this time consideration also given to infection.  With combination of infectious source plus significant tachycardia, plan for readmission. Final Clinical Impression(s) / ED Diagnoses Final diagnoses:  Atrial fibrillation with RVR ([place])  Urinary tract infection without hematuria, site unspecified    Rx / DC Orders ED Discharge Orders    None       Charlesetta Shanks, MD 08/14/20 0030

## 2020-08-13 NOTE — H&P (Addendum)
[facility] Admission History and Physical Service Pager: [phone]  Patient name: Anthony [name] Medical record number: [id number] Date of birth: 1956/06/26 Age: 65 y.o. Gender: male  Primary Care Provider: Carollee Leitz, MD Consultants: none Code Status: Full  Emergency Contact: Alben Jepsen (brother) [phone]  Chief Complaint: AMS  Assessment and Plan: Anthony [name] is a 65 y.o. male presenting with urosepsis and A Fib in RVR. 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  AMS  Sepsis  UTI Patient found by social work in his motel room naked in urine and feces with mild confusion. Upon EMS arrival, he was noted to be tachycardic with HR 210 with soft BP, given 1L NS and diltiazem 20 mg. Upon presentation to the ED, he was afebrile, mildly tachypneic, tachycardic with HR 140, otherwise VSS. EKG notable for A. fib and RVR (detailed below). Labs notable for leukocytosis (WBC 12), bicarb 17, and elevated lactate 2.4, however repeat much improved at 0.5 after 250 cc Banana Bag and 1L LR. UA consistent with UTI (positive for bacteria, WBC>50, and leukocytes, nitrite negative). U tox positive for benzodiazepines (though he did receive chlordiazepoxide taper during most recent hospitalization, last dose 1/7). Ethanol level normal. In the ED, he received an additional 2L IV fluids and started on CTX. On our evaluation, he is around his baseline mental status and still tachycardic in the 130s, respiratory rate has normalized and he is otherwise in no acute distress. Low suspicion for CVA. Initially meeting 3/4 SIRS criteria with source of infection, will admit patient for management of UTI/sepsis and A. fib with RVR. - admit to [facility], progressive, Dr. Owens Shark attending - continue CTX - f/u urine cx, blood cultures, repeat lactate - AM labs: CBC, CMP - vitals per unit routine - seizure  precautions - cardiac monitoring - PT/OT - TOC for dispo - CIWAs  A Fib with RVR Elevated heart rate in the setting of infection and not taking his medications (he reports he did receive his medications at bedside prior to discharge, but he did not take his medications from the hospital because he was rushed out).  EKG obtained on presentation with A. fib with RVR 150.  So far has received 2 doses of IV metoprolol tartrate 5 mg and given metoprolol succinate 25 mg as well as IV Banana Bag 250cc, 1L LR . Home meds: metoprolol succinate 12.5 mg daily, rivaroxaban 20 mg. - s/p diltiazem 20 mg PTA - s/p IV metoprolol tartrate 5 mg x2 - IV metoprolol 5 mg q52min prn for HR > 110 (hold for SBP < 90) up to 5 doses - continue rivaroxaban - metoprolol succinate 25 mg daily  Hypokalemia K 3.2 on admission. Given PO KCl 40 mEq - Continue to monitor, AM BMP  Alcohol use disorder Most recent admission 1/2-1/21 for alcohol withdrawal. Denies alcohol use since being discharged. Ethanol level normal. He was discharged on folate and thiamine. - continue folate, thiamine - monitor CIWA  History of HTN BP this admission 92/74 - 122/93. Previously was on amlodipine and HCTZ which were discontinued on most recent admission due to soft BP.  BP normotensive to soft so far. - Continue to monitor blood pressure  History of hypomagnesemia Normal this admission. Persistently hypomagnesemic throughout most recent admission and was started on magnesium oxide 400 mg BID. - continue magnesium oxide 400 mg BID  Depression/anxiety On sertraline 200 mg  daily. - continue home meds  FEN/GI: NPO Prophylaxis: rivaroxaban  Disposition: progressive  History of Present Illness:  Anthony [name] is a 65 y.o. male presented after being found in his motel room naked with urine and feces with mild confusion. Is a poor historian and avoids answering questions. When asked about what happened to day he shares the  following:  I got a big ruin around last night and got a grand tour of [employer]. They didn't know what they were going to do with me after they got here. I don't know if you can feel it or not, but either I'm sicker than I thought or if it's in my head or something. When asked why he was brought here, he said "my doctor was supposed to meet me here". He said "a male, Dr. Zenovia Jordan. She was sent here from [facility], but I would assume .... I'm bad with names." He said he has been feeling bad the past few days, "I got just about every body pissed off but I can't think of any body that's done their job here. That's not a good public statement. I wouldn't want to put that at the head of my advertisement." When asked if he got his mediations when he left the hospital he says "I got almost nothing." Meds were sent to the patient's rooms before leaving the hospital "I got it, but I mean they stormed in a bunch of crazy Nazi's and I didn't have time to take anything. I believe I had my gown on still." He thinks he left his meds in the hospital. "I don't know if they can do anything about that because it would be around people and they wouldn't do that even if they could." When asked whether he got his things back from the apartment he said "No, they didn't end up going back there because he was in trouble. When I had to leave ... they didn't want me to stay there any more. I'll throw a 50 cent piece on the ground and you could probably throw one and pick another one the ground ... maybe you couldn't. The third one you'd be looking like me."   "Who do you work for? [employer]? 'You heard...' that's not a good advertisement." When asked about what happened today, he said "for 16 days I couldn't go to the pot without it being interrupted by ... feces ... and that's why it was basically all over the place. Because it came out first and I couldn't stop it." That's no effing  accident, my body wasn't right and that's what you're supposed to do while you're here is make it right. You guys haven't done anything to help me."   "I have to call Pilar Plate in a [place] while and make sure what is plans were for his day tomorrow. We do argue a lot. Pilar Plate is pretty smart and I think I'm pretty smart, too."  Review Of Systems: Per HPI with the following additions:   ROS  "Maybe I've had fevers but I don't know Back or belly pain, "don't give me anything that I didn't already know I had, I don't need anything new" Burning with urination - they took a test sample yesterday, they should have tested it by now, is it any better?  Did you pass out - I will say no because I was waiting for Pilar Plate to come over and I was waiting for him.  Patient  Active Problem List   Diagnosis Date Noted  . Rhabdomyolysis 07/22/2020  . Cerebral ventriculomegaly 07/22/2020  . Hemoglobin decreased 07/22/2020  . History of delirium tremons 07/22/2020  . Hypoalbuminemia due to protein-calorie malnutrition ([place]) 07/22/2020  . Tobacco use disorder 07/22/2020  . Alcohol withdrawal delirium ([place])   . Dehydration   . Alcohol abuse with alcohol-induced mood disorder ([place]) 07/18/2018  . Depression 05/26/2018  . Need for immunization against influenza 04/25/2018  . Alcohol withdrawal ([place]) 09/01/2017  . Actinic keratosis 11/27/2016  . Macrocytic anemia 11/23/2016  . History of pulmonary embolus (PE) 11/02/2016  . Hiatal hernia 09/14/2016  . Skin cancer 08/13/2016  . Poor social situation 06/09/2013  . Tinea versicolor 06/08/2013  . 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 HTN (hypertension) 04/11/2012  . Alcohol use disorder, severe, dependence ([place]) 09/16/2006    Past Medical History: Past Medical History:  Diagnosis Date  . Actinic keratosis 11/27/2016  . Alcohol abuse with alcohol-induced mood disorder ([place]) 07/18/2018  . Alcohol use disorder, severe, dependence ([place]) 09/16/2006   05/22/2015  Argumentative, belligerent and verbally abusive to staff per his note from [place]   . Alcoholism ([place])   . Anxiety state 04/25/2018  . Chronic low back pain 09/16/2006   Followed by NS.  Per his chart from [place], he fell off two storyhouse roof while cleaning his brother's gutters and sustained compression fracture of L3, L4 and L5 and fracture of right transverse process at L3 and nondisplaced fracture of left glenoid rim many years ago. He had multiple imaging in [place] including Lumbar MRI in 2016 which showed moderate to severe spinal canal stenos  . Delirium tremens ([place]) 09/01/2017  . Depression   . Dry eye 11/23/2016  . Hiatal hernia 09/14/2016   Status Collis gastroplasty and Nissen's fundoplication on 67/89/3810 in [place]  . History of delirium tremons 07/22/2020   DTs during 10/2016 08/2017. And 12/2017 admissions   . History of pulmonary embolus (PE) 11/02/2016   Unprovoked 11/01/2016. Xarelto from 11/01/16 to 09/08/2017.  Marland Kitchen Hydrocele    Surgically corrected  . Hypertension   . Hypoalbuminemia due to protein-calorie malnutrition ([place]) 07/22/2020  . Lumbar compression fracture ([place])   . Multiple rib fractures 07/22/2019   Left rib details XR: left ribs demonstrate multiple remote rib  . Pancytopenia ([place])   . Rhabdomyolysis 07/22/2020  . Skin cancer    exciced 2017  . Tinea versicolor 06/08/2013  . Tobacco use disorder 07/22/2020  . Tooth infection 04/25/2018  . Trigger finger, acquired 11/18/2012  . Ulnar tunnel syndrome of right wrist 11/08/2012    Past Surgical History: Past Surgical History:  Procedure Laterality Date  . CATARACT EXTRACTION    . HIATAL HERNIA REPAIR  2016  . HYDROCELE EXCISION / REPAIR  2010  . SKIN CANCER EXCISION Left    Excised 2017    Social History: Social History   Tobacco Use  . Smoking status: Never Smoker  . Smokeless tobacco: Current User    Types: Snuff  Vaping Use  . Vaping Use: Never used  Substance Use Topics  .  Alcohol use: Yes    Alcohol/week: 43.0 standard drinks    Types: 42 Cans of beer, 1 Standard drinks or equivalent per week    Comment: daily use-6 beers daily and whiskey when he has it.  . Drug use: Yes    Types: Marijuana    Comment: occasional-none this year per pt   Additional social history:   Please also refer to  relevant sections of EMR.  Family History: Family History  Problem Relation Age of Onset  . Heart attack Father        died at 39 years  . Dementia Father   . Stroke Father   . Cancer Sister   . Colon cancer Neg Hx   . Colon polyps Neg Hx   . Esophageal cancer Neg Hx   . Stomach cancer Neg Hx   . Rectal cancer Neg Hx     Allergies and Medications: No Known Allergies No current facility-administered medications on file prior to encounter.   Current Outpatient Medications on File Prior to Encounter  Medication Sig Dispense Refill  . cetirizine (ZYRTEC) 10 MG tablet Take 1 tablet (10 mg total) by mouth daily. (Patient not taking: Reported on 07/24/2020) 30 tablet 11  . ferrous sulfate 325 (65 FE) MG tablet Take 1 tablet (325 mg total) by mouth 2 (two) times a week. 9 tablet 0  . folic acid (FOLVITE) 1 MG tablet Take 1 tablet (1 mg total) by mouth daily. 30 tablet 0  . hydroxypropyl methylcellulose / hypromellose (ISOPTO TEARS / GONIOVISC) 2.5 % ophthalmic solution Place 1 drop into both eyes as needed for dry eyes. (Patient not taking: Reported on 07/24/2020) 15 mL 12  . magnesium oxide (MAG-OX) 400 (241.3 Mg) MG tablet Take 1 tablet (400 mg total) by mouth 2 (two) times daily. 120 tablet 0  . metoprolol succinate (TOPROL-XL) 25 MG 24 hr tablet Take 0.5 tablets (12.5 mg total) by mouth daily. 90 tablet 0  . rivaroxaban (XARELTO) 20 MG TABS tablet Take 1 tablet (20 mg total) by mouth daily with supper. 30 tablet 0  . sertraline (ZOLOFT) 100 MG tablet Take 2 tablets (200 mg total) by mouth daily. 120 tablet 0  . thiamine 100 MG tablet Take 1 tablet (100 mg total) by  mouth daily. 90 tablet 0    Objective: BP 121/86   Pulse (!) 40   Temp 99.3 F (37.4 C) (Oral)   Resp (!) 23   SpO2 (!) 55%  Exam: General: Alert, laying comfortably in bed with blanket, NAD Eyes: EOMI Neck: supple Cardiovascular: Tachycardic, irregular rhythm, no murmurs, no peripheral edema Respiratory: CTAB, breathing comfortably on room air Gastrointestinal: soft, non-tender, +BS Derm: warm, dry, dried fecal matter noted on lower extremities Neuro: alert, interactive, uncooperative with orientation questions, full strength bilateral upper extremities Psych: affect appropriate, speech tangential, mildly agitated  Labs and Imaging: CBC BMET  Recent Labs  Lab 08/13/20 1828  WBC 12.0*  HGB 10.9*  HCT 34.1*  PLT 299   Recent Labs  Lab 08/13/20 1828  NA 137  K 3.2*  CL 108  CO2 17*  BUN 34*  CREATININE 0.87  GLUCOSE 116*  CALCIUM 9.0     EKG A Fib in RVR 150   Zola Button, MD 08/13/2020, 10:45 PM PGY-1, [place] Intern pager: [phone], text pages welcome   FPTS Upper-Level Resident Addendum  I have independently interviewed and examined the patient. I have discussed the above with the original author and agree with their documentation. My edits for correction/addition/clarification are in italics. Please see also any attending notes.    Milus Banister, DO PGY-3, [facility] 08/14/2020 2:03 AM  FPTS Service pager: [phone] (text pages welcome through [facility])

## 2020-08-13 NOTE — ED Triage Notes (Signed)
Pt bib ems from [employer]. SW found pt naked in urine and feces with mild confusion. EMS called. Pt with 92 sys bp, hr 210. Pt given 1000 ml NS and a total of 20 mg Cardizem. Pt hr 134 afib/rvr. Capnography 19 RR 22-24 A&O x4. Pt has not taken meds in the last few days.

## 2020-08-13 NOTE — H&P (Incomplete)
[facility] Admission History and Physical Service Pager: [phone]  Patient name: Anthony [name] Medical record number: [id number] Date of birth: Apr 27, 1956 Age: 65 y.o. Gender: male  Primary Care Provider: Carollee Leitz, MD Consultants: none Code Status: ***  Chief Complaint: ***  Assessment and Plan: Anthony [name] is a 65 y.o. male presenting with *** . PMH is significant for ***   - admit to [facility], progressive, Dr. Owens [name] attending - continue CTX - f/u blood cultures, lactate - s/p 1L IV fluids -   A Fib with RVR  Home meds: metoprolol succinate 12.5 mg daily, rivaroxaban 20 mg. - s/p IV metoprolol tartrate 5 mg x2 ***  Alcohol use disorder Most recent admission 1/2-1/21 for alcohol withdrawal. Denies alcohol use since being discharged. Ethanol level normal. He was discharged on folate and thiamine. - continue folate, thiamine - monitor CIWA***  History of hypomagnesemia Normal on admission. Persistently hypomagnesemic throughout most recent admission and was started on magnesium oxide 400 mg BID.  Depression On sertraline 200 mg daily.  FEN/GI: *** Prophylaxis: ***  Disposition: ***  History of Present Illness:  Anthony [name] is a 65 y.o. male presenting with ***  I got a big ruin around last night and got a grand tour of [employer]. They didn't know what they were going to do with me after they got here. I don't know if you can feel it or not, but either I'm sicker than I thought or if it's in my head or something. When asked why he was brought here, he said "my doctor was supposed to meet me here". He said "a male, Anthony [name]. She was sent here from [facility], but I would assume .... I'm bad with names." He said he has been feeling bad the past few days, "I got just about every body pissed off but I can't think of any body that's done their job here. That's not a good public statement. I wouldn't want to put that  at the head of my advertisement." When asked if he got his mediations when he left the hospital he says "I got almost nothing." Meds were sent to the patient's rooms before leaving the hospital "I got it, but I mean they stormed in a bunch of crazy Nazi's and I didn't have time to take anything. I believe I had my gown on still." He thinks he left his meds in the hospital. "I don't know if they can do anything about that because it would be around people and they wouldn't do that even if they could." When asked whether he got his things back from the apartment he said "No, they didn't end up going back there because he was in trouble. When I had to leave ... they didn't want me to stay there any more. I'll throw a 50 cent piece on the ground and you could probably throw one and pick another one the ground ... maybe you couldn't. The third one you'd be looking like me."   "Who do you work for? [employer]? 'You heard...' that's not a good advertisement." When asked about what happened today, he said "for 16 days I couldn't go to the pot without it being interrupted by ... feces ... and that's why it was basically all over the place. Because it came out first and I couldn't stop it." That's no effing accident, my body wasn't right and that's what you're supposed to do  while you're here is make it right. You guys haven't done anything to help me."   I have to call Anthony [name] in a [place] while and make sure what is plans were for his day tomorrow. We do argue a lot. Anthony [name] is pretty smart and I think I'm pretty smart, too.   Review Of Systems: Per HPI with the following additions: ***  ROS *** Maybe I've had fevers but I don't know Back or belly pain, "don't give me anything that I didn't already know I had, I don't need anything new" Burning with urination - they took a test sample yesterday, they should have tested it by now, is it any better? No... I didn't buy that much because I knew I  couldn't carry it but I would put a question mark, I wasn't looking where I was going, I was looking at the ground, I couldn't lift my head up. I think I got home with it all, I'm not positive.  Did you pass out - I will say no because I was waiting for Anthony [name] to come over and I was waiting for him,   Patient Active Problem List   Diagnosis Date Noted  . Rhabdomyolysis 07/22/2020  . Cerebral ventriculomegaly 07/22/2020  . Hemoglobin decreased 07/22/2020  . History of delirium tremons 07/22/2020  . Hypoalbuminemia due to protein-calorie malnutrition ([place]) 07/22/2020  . Tobacco use disorder 07/22/2020  . Alcohol withdrawal delirium ([place])   . Dehydration   . Alcohol abuse with alcohol-induced mood disorder ([place]) 07/18/2018  . Depression 05/26/2018  . Need for immunization against influenza 04/25/2018  . Alcohol withdrawal ([place]) 09/01/2017  . Actinic keratosis 11/27/2016  . Macrocytic anemia 11/23/2016  . History of pulmonary embolus (PE) 11/02/2016  . Hiatal hernia 09/14/2016  . Skin cancer 08/13/2016  . Poor social situation 06/09/2013  . Tinea versicolor 06/08/2013  . 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 HTN (hypertension) 04/11/2012  . Alcohol use disorder, severe, dependence ([place]) 09/16/2006    Past Medical History: Past Medical History:  Diagnosis Date  . Actinic keratosis 11/27/2016  . Alcohol abuse with alcohol-induced mood disorder ([place]) 07/18/2018  . Alcohol use disorder, severe, dependence ([place]) 09/16/2006   05/22/2015 Argumentative, belligerent and verbally abusive to staff per his note from [place]   . Alcoholism ([place])   . Anxiety state 04/25/2018  . Chronic low back pain 09/16/2006   Followed by NS.  Per his chart from [place], he fell off two storyhouse roof while cleaning his brother's gutters and sustained compression fracture of L3, L4 and L5 and fracture of right transverse process at L3 and nondisplaced fracture of left glenoid rim many years ago.  He had multiple imaging in [place] including Lumbar MRI in 2016 which showed moderate to severe spinal canal stenos  . Delirium tremens ([place]) 09/01/2017  . Depression   . Dry eye 11/23/2016  . Hiatal hernia 09/14/2016   Status Collis gastroplasty and Nissen's fundoplication on [id number] in [place]  . History of delirium tremons 07/22/2020   DTs during 10/2016 08/2017. And 12/2017 admissions   . History of pulmonary embolus (PE) 11/02/2016   Unprovoked 11/01/2016. Xarelto from 11/01/16 to 09/08/2017.  Marland Kitchen Hydrocele    Surgically corrected  . Hypertension   . Hypoalbuminemia due to protein-calorie malnutrition ([place]) 07/22/2020  . Lumbar compression fracture ([place])   . Multiple rib fractures 07/22/2019   Left rib details XR: left ribs demonstrate multiple remote rib  . Pancytopenia ([place])   . Rhabdomyolysis 07/22/2020  .  Skin cancer    exciced 2017  . Tinea versicolor 06/08/2013  . Tobacco use disorder 07/22/2020  . Tooth infection 04/25/2018  . Trigger finger, acquired 11/18/2012  . Ulnar tunnel syndrome of right wrist 11/08/2012    Past Surgical History: Past Surgical History:  Procedure Laterality Date  . CATARACT EXTRACTION    . HIATAL HERNIA REPAIR  2016  . HYDROCELE EXCISION / REPAIR  2010  . SKIN CANCER EXCISION Left    Excised 2017    Social History: Social History   Tobacco Use  . Smoking status: Never Smoker  . Smokeless tobacco: Current User    Types: Snuff  Vaping Use  . Vaping Use: Never used  Substance Use Topics  . Alcohol use: Yes    Alcohol/week: 43.0 standard drinks    Types: 42 Cans of beer, 1 Standard drinks or equivalent per week    Comment: daily use-6 beers daily and whiskey when he has it.  . Drug use: Yes    Types: Marijuana    Comment: occasional-none this year per pt   Additional social history: ***  Please also refer to relevant sections of EMR.  Family History: Family History  Problem Relation Age of Onset  . Heart attack Father        died  at 70 years  . Dementia Father   . Stroke Father   . Cancer Sister   . Colon cancer Neg Hx   . Colon polyps Neg Hx   . Esophageal cancer Neg Hx   . Stomach cancer Neg Hx   . Rectal cancer Neg Hx    (***If not completed, MUST add something in)  Allergies and Medications: No Known Allergies No current facility-administered medications on file prior to encounter.   Current Outpatient Medications on File Prior to Encounter  Medication Sig Dispense Refill  . cetirizine (ZYRTEC) 10 MG tablet Take 1 tablet (10 mg total) by mouth daily. (Patient not taking: Reported on 07/24/2020) 30 tablet 11  . ferrous sulfate 325 (65 FE) MG tablet Take 1 tablet (325 mg total) by mouth 2 (two) times a week. 9 tablet 0  . folic acid (FOLVITE) 1 MG tablet Take 1 tablet (1 mg total) by mouth daily. 30 tablet 0  . hydroxypropyl methylcellulose / hypromellose (ISOPTO TEARS / GONIOVISC) 2.5 % ophthalmic solution Place 1 drop into both eyes as needed for dry eyes. (Patient not taking: Reported on 07/24/2020) 15 mL 12  . magnesium oxide (MAG-OX) 400 (241.3 Mg) MG tablet Take 1 tablet (400 mg total) by mouth 2 (two) times daily. 120 tablet 0  . metoprolol succinate (TOPROL-XL) 25 MG 24 hr tablet Take 0.5 tablets (12.5 mg total) by mouth daily. 90 tablet 0  . rivaroxaban (XARELTO) 20 MG TABS tablet Take 1 tablet (20 mg total) by mouth daily with supper. 30 tablet 0  . sertraline (ZOLOFT) 100 MG tablet Take 2 tablets (200 mg total) by mouth daily. 120 tablet 0  . thiamine 100 MG tablet Take 1 tablet (100 mg total) by mouth daily. 90 tablet 0    Objective: BP 121/86   Pulse (!) 40   Temp 99.3 F (37.4 C) (Oral)   Resp (!) 23   SpO2 (!) 55%  Exam: General: *** Eyes: *** ENTM: *** Neck: *** Cardiovascular: *** Respiratory: *** Gastrointestinal: *** MSK: *** Derm: *** Neuro: *** Psych: ***  Labs and Imaging: CBC BMET  Recent Labs  Lab 08/13/20 1828  WBC 12.0*  HGB 10.9*  HCT 34.1*  PLT 299   Recent  Labs  Lab 08/13/20 1828  NA 137  K 3.2*  CL 108  CO2 17*  BUN 34*  CREATININE 0.87  GLUCOSE 116*  CALCIUM 9.0     ***  Zola Button, MD 08/13/2020, 10:45 PM PGY-1, [place] Intern pager: [phone], text pages welcome

## 2020-08-14 ENCOUNTER — Encounter (HOSPITAL_COMMUNITY): Payer: Self-pay | Admitting: Family Medicine

## 2020-08-14 DIAGNOSIS — A419 Sepsis, unspecified organism: Secondary | ICD-10-CM

## 2020-08-14 DIAGNOSIS — G928 Other toxic encephalopathy: Secondary | ICD-10-CM | POA: Diagnosis not present

## 2020-08-14 DIAGNOSIS — M7989 Other specified soft tissue disorders: Secondary | ICD-10-CM | POA: Diagnosis not present

## 2020-08-14 DIAGNOSIS — Z6820 Body mass index (BMI) 20.0-20.9, adult: Secondary | ICD-10-CM | POA: Diagnosis not present

## 2020-08-14 DIAGNOSIS — R059 Cough, unspecified: Secondary | ICD-10-CM | POA: Diagnosis not present

## 2020-08-14 DIAGNOSIS — M545 Low back pain, unspecified: Secondary | ICD-10-CM | POA: Diagnosis not present

## 2020-08-14 DIAGNOSIS — N390 Urinary tract infection, site not specified: Secondary | ICD-10-CM | POA: Diagnosis not present

## 2020-08-14 DIAGNOSIS — E8809 Other disorders of plasma-protein metabolism, not elsewhere classified: Secondary | ICD-10-CM | POA: Diagnosis not present

## 2020-08-14 DIAGNOSIS — Z23 Encounter for immunization: Secondary | ICD-10-CM | POA: Diagnosis not present

## 2020-08-14 DIAGNOSIS — J984 Other disorders of lung: Secondary | ICD-10-CM | POA: Diagnosis not present

## 2020-08-14 DIAGNOSIS — Z5900 Homelessness unspecified: Secondary | ICD-10-CM | POA: Diagnosis not present

## 2020-08-14 DIAGNOSIS — A4151 Sepsis due to Escherichia coli [E. coli]: Secondary | ICD-10-CM | POA: Diagnosis not present

## 2020-08-14 DIAGNOSIS — I4891 Unspecified atrial fibrillation: Secondary | ICD-10-CM

## 2020-08-14 DIAGNOSIS — G9340 Encephalopathy, unspecified: Secondary | ICD-10-CM | POA: Diagnosis not present

## 2020-08-14 DIAGNOSIS — R6520 Severe sepsis without septic shock: Secondary | ICD-10-CM

## 2020-08-14 DIAGNOSIS — J84116 Cryptogenic organizing pneumonia: Secondary | ICD-10-CM | POA: Diagnosis not present

## 2020-08-14 DIAGNOSIS — G8929 Other chronic pain: Secondary | ICD-10-CM | POA: Diagnosis not present

## 2020-08-14 DIAGNOSIS — E43 Unspecified severe protein-calorie malnutrition: Secondary | ICD-10-CM | POA: Diagnosis not present

## 2020-08-14 DIAGNOSIS — R931 Abnormal findings on diagnostic imaging of heart and coronary circulation: Secondary | ICD-10-CM | POA: Diagnosis not present

## 2020-08-14 DIAGNOSIS — I2510 Atherosclerotic heart disease of native coronary artery without angina pectoris: Secondary | ICD-10-CM | POA: Diagnosis not present

## 2020-08-14 DIAGNOSIS — E871 Hypo-osmolality and hyponatremia: Secondary | ICD-10-CM | POA: Diagnosis not present

## 2020-08-14 DIAGNOSIS — J9811 Atelectasis: Secondary | ICD-10-CM | POA: Diagnosis not present

## 2020-08-14 DIAGNOSIS — F411 Generalized anxiety disorder: Secondary | ICD-10-CM | POA: Diagnosis present

## 2020-08-14 DIAGNOSIS — Z659 Problem related to unspecified psychosocial circumstances: Secondary | ICD-10-CM | POA: Diagnosis not present

## 2020-08-14 DIAGNOSIS — R64 Cachexia: Secondary | ICD-10-CM | POA: Diagnosis not present

## 2020-08-14 DIAGNOSIS — E46 Unspecified protein-calorie malnutrition: Secondary | ICD-10-CM | POA: Diagnosis not present

## 2020-08-14 DIAGNOSIS — F329 Major depressive disorder, single episode, unspecified: Secondary | ICD-10-CM | POA: Diagnosis present

## 2020-08-14 DIAGNOSIS — J189 Pneumonia, unspecified organism: Secondary | ICD-10-CM | POA: Diagnosis not present

## 2020-08-14 DIAGNOSIS — R0600 Dyspnea, unspecified: Secondary | ICD-10-CM | POA: Diagnosis not present

## 2020-08-14 DIAGNOSIS — R7401 Elevation of levels of liver transaminase levels: Secondary | ICD-10-CM | POA: Diagnosis not present

## 2020-08-14 DIAGNOSIS — Z1611 Resistance to penicillins: Secondary | ICD-10-CM | POA: Diagnosis not present

## 2020-08-14 DIAGNOSIS — F1020 Alcohol dependence, uncomplicated: Secondary | ICD-10-CM | POA: Diagnosis not present

## 2020-08-14 DIAGNOSIS — I110 Hypertensive heart disease with heart failure: Secondary | ICD-10-CM | POA: Diagnosis present

## 2020-08-14 DIAGNOSIS — E876 Hypokalemia: Secondary | ICD-10-CM | POA: Diagnosis present

## 2020-08-14 DIAGNOSIS — Y95 Nosocomial condition: Secondary | ICD-10-CM | POA: Diagnosis not present

## 2020-08-14 DIAGNOSIS — I5032 Chronic diastolic (congestive) heart failure: Secondary | ICD-10-CM | POA: Diagnosis not present

## 2020-08-14 DIAGNOSIS — Z20822 Contact with and (suspected) exposure to covid-19: Secondary | ICD-10-CM | POA: Diagnosis not present

## 2020-08-14 DIAGNOSIS — I480 Paroxysmal atrial fibrillation: Secondary | ICD-10-CM | POA: Diagnosis present

## 2020-08-14 DIAGNOSIS — F17290 Nicotine dependence, other tobacco product, uncomplicated: Secondary | ICD-10-CM | POA: Diagnosis present

## 2020-08-14 DIAGNOSIS — I482 Chronic atrial fibrillation, unspecified: Secondary | ICD-10-CM | POA: Diagnosis not present

## 2020-08-14 DIAGNOSIS — J8410 Pulmonary fibrosis, unspecified: Secondary | ICD-10-CM | POA: Diagnosis not present

## 2020-08-14 LAB — CBC
HCT: 30.2 % — ABNORMAL LOW (ref 39.0–52.0)
Hemoglobin: 10.1 g/dL — ABNORMAL LOW (ref 13.0–17.0)
MCH: 35.7 pg — ABNORMAL HIGH (ref 26.0–34.0)
MCHC: 33.4 g/dL (ref 30.0–36.0)
MCV: 106.7 fL — ABNORMAL HIGH (ref 80.0–100.0)
Platelets: 261 10*3/uL (ref 150–400)
RBC: 2.83 MIL/uL — ABNORMAL LOW (ref 4.22–5.81)
RDW: 12.5 % (ref 11.5–15.5)
WBC: 9.6 10*3/uL (ref 4.0–10.5)
nRBC: 0 % (ref 0.0–0.2)

## 2020-08-14 LAB — COMPREHENSIVE METABOLIC PANEL
ALT: 34 U/L (ref 0–44)
AST: 64 U/L — ABNORMAL HIGH (ref 15–41)
Albumin: 2.2 g/dL — ABNORMAL LOW (ref 3.5–5.0)
Alkaline Phosphatase: 78 U/L (ref 38–126)
Anion gap: 11 (ref 5–15)
BUN: 30 mg/dL — ABNORMAL HIGH (ref 8–23)
CO2: 17 mmol/L — ABNORMAL LOW (ref 22–32)
Calcium: 8.6 mg/dL — ABNORMAL LOW (ref 8.9–10.3)
Chloride: 108 mmol/L (ref 98–111)
Creatinine, Ser: 0.82 mg/dL (ref 0.61–1.24)
GFR, Estimated: >60 mL/min (ref >60)
Glucose, Bld: 105 mg/dL — ABNORMAL HIGH (ref 70–99)
Potassium: 3.2 mmol/L — ABNORMAL LOW (ref 3.5–5.1)
Sodium: 136 mmol/L (ref 135–145)
Total Bilirubin: 1.2 mg/dL (ref 0.3–1.2)
Total Protein: 6 g/dL — ABNORMAL LOW (ref 6.5–8.1)

## 2020-08-14 LAB — SARS CORONAVIRUS 2 (TAT 6-24 HRS): SARS Coronavirus 2: NEGATIVE

## 2020-08-14 LAB — LACTIC ACID, PLASMA: Lactic Acid, Venous: 0.5 mmol/L (ref 0.5–1.9)

## 2020-08-14 LAB — AMMONIA: Ammonia: 14 umol/L (ref 9–35)

## 2020-08-14 MED ORDER — AMIODARONE HCL IN DEXTROSE 360-4.14 MG/200ML-% IV SOLN
30.0000 mg/h | INTRAVENOUS | Status: DC
  Administered 2020-08-14 – 2020-08-16 (×3): 30 mg/h via INTRAVENOUS
  Filled 2020-08-14 (×3): qty 200

## 2020-08-14 MED ORDER — AMIODARONE HCL IN DEXTROSE 360-4.14 MG/200ML-% IV SOLN
60.0000 mg/h | INTRAVENOUS | Status: DC
  Administered 2020-08-14 (×2): 60 mg/h via INTRAVENOUS
  Filled 2020-08-14 (×2): qty 200

## 2020-08-14 MED ORDER — METOPROLOL TARTRATE 5 MG/5ML IV SOLN: 5.0000 mg | INTRAVENOUS | Status: DC | PRN

## 2020-08-14 MED ORDER — SODIUM CHLORIDE 0.9 % IV BOLUS
250.0000 mL | Freq: Once | INTRAVENOUS | Status: AC
  Administered 2020-08-14: 250 mL via INTRAVENOUS

## 2020-08-14 MED ORDER — ENOXAPARIN SODIUM 80 MG/0.8ML ~~LOC~~ SOLN
1.0000 mg/kg | Freq: Two times a day (BID) | SUBCUTANEOUS | Status: DC
  Filled 2020-08-14: qty 0.63

## 2020-08-14 MED ORDER — SODIUM CHLORIDE 0.9 % IV SOLN
1.0000 g | INTRAVENOUS | Status: DC
  Administered 2020-08-14 – 2020-08-15 (×2): 1 g via INTRAVENOUS
  Filled 2020-08-14 (×2): qty 10

## 2020-08-14 MED ORDER — LORAZEPAM 2 MG/ML IJ SOLN
0.5000 mg | Freq: Once | INTRAMUSCULAR | Status: AC
  Administered 2020-08-14: 0.5 mg via INTRAVENOUS
  Filled 2020-08-14: qty 1

## 2020-08-14 MED ORDER — LACTATED RINGERS IV SOLN: INTRAVENOUS | Status: AC

## 2020-08-14 MED ORDER — DIGOXIN 0.25 MG/ML IJ SOLN
0.2500 mg | Freq: Once | INTRAMUSCULAR | Status: AC
  Administered 2020-08-14: 0.25 mg via INTRAVENOUS
  Filled 2020-08-14: qty 2

## 2020-08-14 MED ORDER — AMIODARONE LOAD VIA INFUSION
150.0000 mg | Freq: Once | INTRAVENOUS | Status: AC
  Administered 2020-08-14: 150 mg via INTRAVENOUS
  Filled 2020-08-14: qty 83.34

## 2020-08-14 MED ORDER — POTASSIUM CHLORIDE CRYS ER 20 MEQ PO TBCR
40.0000 meq | EXTENDED_RELEASE_TABLET | Freq: Once | ORAL | Status: AC
  Administered 2020-08-14: 40 meq via ORAL
  Filled 2020-08-14: qty 2

## 2020-08-14 MED ORDER — POTASSIUM CHLORIDE 20 MEQ PO PACK
40.0000 meq | PACK | Freq: Once | ORAL | Status: AC
  Administered 2020-08-14: 40 meq via ORAL
  Filled 2020-08-14: qty 2

## 2020-08-14 MED ORDER — ENOXAPARIN SODIUM 60 MG/0.6ML ~~LOC~~ SOLN
60.0000 mg | Freq: Two times a day (BID) | SUBCUTANEOUS | Status: DC
  Administered 2020-08-14 – 2020-08-16 (×5): 60 mg via SUBCUTANEOUS
  Filled 2020-08-14 (×5): qty 0.6

## 2020-08-14 NOTE — ED Notes (Signed)
Patient cleaned up and linen changed. Condom cath applied to patient.

## 2020-08-14 NOTE — ED Notes (Signed)
MD Ouida Sills to place PRN order to decrease agitation/aid in pt comfort

## 2020-08-14 NOTE — Hospital Course (Addendum)
Anthony [name] is a 65 y.o. male with a history of alcohol use disorder and A. Fib who presented with urosepsis and A Fib with RVR. Hospital course outlined by problem below:  Sepsis  E. Coli UTI Patient initially meeting sepsis criteria with tachypnea, tachycardia, and leukocytosis.  Some confusion also noted, however appeared to be close to his baseline on our evaluation.  He was also noted to have a UTI positive for E. coli resistant only to ampicillin.  He was initially started on CTX, which was subsequently narrowed to cephalexin once urine culture susceptibilities resulted for a total of 10 days of antibiotic treatment.  Persistent Dyspnea  Cryptogenic Organizing Pneumonia  Grade 2 Diastolic Dysfunction Patient had persistent dyspnea that was worked up with CBC showing iron deficiency anemia- treated with oral iron supplementation. Patient continued to note dyspnea despite supplementation, so chest x-ray obtained that showed new opacity in apex of left lung on 3/6, questionable infectious or inflammatory process. CT chest on 3/7 most consistent with infectious process. He received CTX for 5 days to treat HAP, abx from 3/8-12. After completing CTX course, patient continued to note dyspnea. Cardiology consulted and echo showed preserved EF 60-65% with Grade 2 diastolic dysfunction and patient was started on low dose Lasix of 20mg  daily. Patient was insistent on receiving work up for ischemic heart disease with stress test, but cardiology recommended coronary CTA as best test choice. Coronary CTA on 3/16 showed mild non-obstructive stenoses and concern for cryptogenic organizing pneumonia. Pulmonology consulted and recommended no further antibiotic treatment, repeat chest CT in 6-8 weeks, follow up with pulmonology as an outpatient, and offered dulera for dyspnea, which he was provided upon discharge.  A Fib with RVR Patient presented in A. fib with RVR 150 on presentation in the setting of medication  noncompliance.  He was treated with metoprolol and initially on amiodarone drip due to soft BP before transitioning to oral amiodarone.  For anticoagulation, he was transitioned from rivaroxaban to enoxaparin initially due to poor p.o. intake, but this was transitioned back to p.o. meds once he was eating adequate meals.  Alcohol use disorder He was started on CIWA monitoring initially, but his CIWA scores were stable and he did not require any lorazepam per CIWA protocol, so monitoring was discontinued. Patient noted no seizures or withdrawal symptoms throughout the remainder of hospitalization.  Hypomagnesemia Persistently hypomagnesemic throughout most recent admission in during this admission.  He was continued on magnesium oxide 400 mg twice daily until experiencing multiple episodes of diarrhea. Discontinued magnesium oxide and given IV Mg. Thus was switched to extended release magnesium chloride supplementation which improved his symptoms.  Patient discharged with supplementation.  Magnesium on discharge of 1.7  Iron Deficiency Anemia PO iron started in the hospital.  Would benefit from a colonoscopy in the outpatient setting.  All other issues stable and chronic.    Issues to follow up Patient experiencing occasional leg cramping, possibly due to low Mg levels, consider repeat testing as needed (last tested on 3/21 and was 1.7). On magnesium chloride 64 mg supplement daily.  Patient had multiple hypotensive episodes during hospitalization that resolved, reassess to possibly consider holding metoprolol if this becomes a consistent issue.  Repeat CT Chest in 6-8 weeks Follow-up with Pulmonary for PFTs. Office contact info in AVS Patient started on Dulera 2puffs BID.  Iron deficiency anemia, presently on oral supplementation. Last CBC 3/16 with Hgb 12.2. Recheck CBC.

## 2020-08-14 NOTE — Progress Notes (Addendum)
Family Medicine Teaching Service Daily Progress Note Intern Pager: [phone]  Patient name: Anthony [name] Medical record number: [id number] Date of birth: 02/27/1956 Age: 65 y.o. Gender: male  Primary Care Provider: Carollee Leitz, MD Consultants: Cards  Code Status: Full   Pt Overview and Major Events to Date:  1/21- Discharge from previous admission 1/25- Bounce-back admission  Assessment and Plan: Anthony [name] is a 65 y.o. male presenting with urosepsis and A Fib in RVR. 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  AMS  Sepsis  UTI Continues to meet 2/4 SIRS criteria as he is tachypneic and tachycardic. Has remained afebrile throughout. Lactate 2.4>0.5. WBC 12.0>9.6. Blood and urine cultures pending.  - Continue CTX  - f/u urine cx, blood cultures - Daily labs: CBC, CMP - Vitals per unit routine - Cardiac monitoring - PT/OT - TOC for dispo  A Fib with RVR Heart rate remains elevated. Did not receive any doses of PRN IV metoprolol overnight. HR in the 120s during interview. Patient is asymptomatic. Is not eating full meals right now, so concerned that his Xarelto will not provide adequate anticoagulation.  - Metoprolol succinate 25mg  daily  - Adding on Amiodarone gtt for rate control  - IV metoprolol 5 mg q42min prn for HR > 110 (hold for SBP < 90) up to 5 doses - Consult to cardiology, appreciate recommendations - Switch rivaroxaban to Lovenox 60mg  BID - metoprolol succinate 25 mg daily  Hypokalemia K 3.2 on admission. K this am 3.2 s/p PO KCl 40 mEq.  - Repeat PO KCl 40 mEq.  - Continue to monitor, replete as needed   Alcohol use disorder No CIWAs documented in chart. Received 1/2mg  Ativan for agitation at 0820.  - continue folate, thiamine - monitor CIWA  History of HTN Normotensive, not on any BP meds.  - Continue to monitor blood pressure  History of hypomagnesemia Most  recent mag 1.7.  - Continue magnesium oxide 400 mg BID  Depression/anxiety On sertraline 200 mg daily. - continue home meds  FEN/GI: NPO Prophylaxis: Lovenox 1mg /kg while NPO, can consider restarting xarelto once he has a diet  Status is: Observation  The patient will require care spanning > 2 midnights and should be moved to inpatient because: Will require SNF Placement  Dispo: The patient is from: Home              Anticipated d/c is to: SNF              Anticipated d/c date is: 2 days              Patient currently is not medically stable to d/c.   Difficult to place patient Yes, legal history   Subjective:  Anthony [name] is very talkative during interview and seems to be mentating at his baseline, which is somewhat confused and circuitous. He expresses some displeasure with the care that he received while he was here last and feels that he is not better now than when he was first admitted.   Objective: Temp:  [98.9 F (37.2 C)-99.3 F (37.4 C)] 98.9 F (37.2 C) (01/26 0447) Pulse Rate:  [29-138] 126 (01/26 0445) Resp:  [18-34] 25 (01/26 0445) BP: (88-122)/(60-93) 104/64 (01/26 0445) SpO2:  [55 %-100 %] 95 % (01/26 0445) Physical Exam: General: Unkempt, very thin 65 yo man Cardiovascular: Tachycardic, regular rhytm, no m/r/g Respiratory: Lungs CTAB, normal WOB Abdomen: Soft,  non-tender, non-distended, without ascites or hepatomegaly Extremities: No LE edema  Laboratory: Recent Labs  Lab 08/13/20 1828 08/14/20 0304  WBC 12.0* 9.6  HGB 10.9* 10.1*  HCT 34.1* 30.2*  PLT 299 261   Recent Labs  Lab 08/13/20 1828 08/14/20 0304  NA 137 136  K 3.2* 3.2*  CL 108 108  CO2 17* 17*  BUN 34* 30*  CREATININE 0.87 0.82  CALCIUM 9.0 8.6*  PROT 6.9 6.0*  BILITOT 1.4* 1.2  ALKPHOS 84 78  ALT 30 34  AST 45* 64*  GLUCOSE 116* 105*    Imaging/Diagnostic Tests: No new imaging/tests  Pearla Dubonnet, Medical Student 08/14/2020, 5:46 AM   Resident attestation: I  agree with the documentation of Student Dr. Joelyn Oms above. I have made adjustments to his note as appropriate. I have seen the patient and performed physical exam on the patient consistent with his documented physical exam above.  Lurline Del, DO

## 2020-08-14 NOTE — Evaluation (Signed)
Clinical/Bedside Swallow Evaluation Patient Details  Name: Anthony [name] MRN: [id number] Date of Birth: Mar 05, 1956  Today's Date: 08/14/2020 Time: SLP Start Time (ACUTE ONLY): 1444 SLP Stop Time (ACUTE ONLY): 1502 SLP Time Calculation (min) (ACUTE ONLY): 18 min  Past Medical History:  Past Medical History:  Diagnosis Date  . Actinic keratosis 11/27/2016  . Alcohol abuse with alcohol-induced mood disorder ([place]) 07/18/2018  . Alcohol use disorder, severe, dependence ([place]) 09/16/2006   05/22/2015 Argumentative, belligerent and verbally abusive to staff per his note from [place]   . Alcoholism ([place])   . Anxiety state 04/25/2018  . Chronic low back pain 09/16/2006   Followed by NS.  Per his chart from [place], he fell off two storyhouse roof while cleaning his brother's gutters and sustained compression fracture of L3, L4 and L5 and fracture of right transverse process at L3 and nondisplaced fracture of left glenoid rim many years ago. He had multiple imaging in [place] including Lumbar MRI in 2016 which showed moderate to severe spinal canal stenos  . Delirium tremens ([place]) 09/01/2017  . Depression   . Dry eye 11/23/2016  . Hiatal hernia 09/14/2016   Status Collis gastroplasty and Nissen's fundoplication on 12/75/1700 in [place]  . History of delirium tremons 07/22/2020   DTs during 10/2016 08/2017. And 12/2017 admissions   . History of pulmonary embolus (PE) 11/02/2016   Unprovoked 11/01/2016. Xarelto from 11/01/16 to 09/08/2017.  Marland Kitchen Hydrocele    Surgically corrected  . Hypertension   . Hypoalbuminemia due to protein-calorie malnutrition ([place]) 07/22/2020  . Lumbar compression fracture ([place])   . Multiple rib fractures 07/22/2019   Left rib details XR: left ribs demonstrate multiple remote rib  . Pancytopenia ([place])   . Rhabdomyolysis 07/22/2020  . Skin cancer    exciced 2017  . Tinea versicolor 06/08/2013  . Tobacco use disorder 07/22/2020  . Tooth infection 04/25/2018  . Trigger  finger, acquired 11/18/2012  . Ulnar tunnel syndrome of right wrist 11/08/2012   Past Surgical History:  Past Surgical History:  Procedure Laterality Date  . CATARACT EXTRACTION    . HIATAL HERNIA REPAIR  2016  . HYDROCELE EXCISION / REPAIR  2010  . SKIN CANCER EXCISION Left    Excised 2017   HPI:  Pt is a 65 y.o. male presenting with AMS, urosepsis, and A Fib in RVR. Imaging without acute cardiopulmonary or intracranial findings. PMH is significant for A Fib, alcohol use disorder with history of DT, HFpEF, HTN, chronic back pain, anxiety, depression, right PE, HH (s/p repair in 2016).   Assessment / Plan / Recommendation Clinical Impression  Pt's oropharyngeal swallow appears to be grossly functional considering his limited dentition. He has a hard time describing what his baseline diet may consist of, telling me that he eats "mostly small children" but also sharing that some harder foods make his gums bleed. Will start with a mechanical soft diet and thin liquids. SLP f/u not indicated at this time, but RN could downgrade diet to softer foods if he has any difficulty masticating these softer solids. SLP Visit Diagnosis: Dysphagia, unspecified (R13.10)    Aspiration Risk  Mild aspiration risk    Diet Recommendation Dysphagia 3 (Mech soft);Thin liquid   Liquid Administration via: Cup;Straw Medication Administration: Whole meds with liquid Supervision: Staff to assist with self feeding Compensations: Minimize environmental distractions;Slow rate;Small sips/bites Postural Changes: Seated upright at 90 degrees    Other  Recommendations Oral Care Recommendations: Oral care BID   Follow up Recommendations 24  hour supervision/assistance      Frequency and Duration            Prognosis Prognosis for Safe Diet Advancement: Good Barriers to Reach Goals: Cognitive deficits      Swallow Study   General HPI: Pt is a 65 y.o. male presenting with AMS, urosepsis, and A Fib in RVR.  Imaging without acute cardiopulmonary or intracranial findings. PMH is significant for A Fib, alcohol use disorder with history of DT, HFpEF, HTN, chronic back pain, anxiety, depression, right PE, HH (s/p repair in 2016). Type of Study: Bedside Swallow Evaluation Previous Swallow Assessment: none in chart Diet Prior to this Study: NPO Temperature Spikes Noted: No Respiratory Status: Room air History of Recent Intubation: No Behavior/Cognition: Alert;Cooperative;Requires cueing Oral Cavity Assessment: Within Functional Limits Oral Care Completed by SLP: No Oral Cavity - Dentition: Poor condition;Missing dentition Vision: Functional for self-feeding Self-Feeding Abilities: Needs assist Patient Positioning: Upright in bed Baseline Vocal Quality: Normal Volitional Cough: Strong Volitional Swallow: Able to elicit    Oral/Motor/Sensory Function Overall Oral Motor/Sensory Function: Within functional limits   Ice Chips Ice chips: Not tested   Thin Liquid Thin Liquid: Within functional limits Presentation: Cup;Self Fed;Straw    Nectar Thick Nectar Thick Liquid: Not tested   Honey Thick Honey Thick Liquid: Not tested   Puree Puree: Within functional limits Presentation: Spoon   Solid     Solid: Within functional limits (considering limited dentition) Presentation: Self Fed      [name]., M.A. [place] Pager [phone] Office [phone]  08/14/2020,4:27 PM

## 2020-08-14 NOTE — Progress Notes (Signed)
   08/14/20 1530  Assess: MEWS Score  Temp 98.9 F (37.2 C)  BP 103/83  Pulse Rate (!) 107  ECG Heart Rate (!) 107  Resp (!) 24  Level of Consciousness Alert  SpO2 93 %  O2 Device Room Air  Assess: MEWS Score  MEWS Temp 0  MEWS Systolic 0  MEWS Pulse 1  MEWS RR 1  MEWS LOC 0  MEWS Score 2  MEWS Score Color Yellow  Assess: if the MEWS score is Yellow or Red  Were vital signs taken at a resting state? Yes  Focused Assessment No change from prior assessment  Early Detection of Sepsis Score *See Row Information* Low  MEWS guidelines implemented *See Row Information* Yes  Treat  MEWS Interventions Escalated (See documentation below)  Pain Scale 0-10  Pain Score 0  Take Vital Signs  Increase Vital Sign Frequency  Yellow: Q 2hr X 2 then Q 4hr X 2, if remains yellow, continue Q 4hrs  Escalate  MEWS: Escalate Yellow: discuss with charge nurse/RN and consider discussing with provider and RRT  Notify: Charge Nurse/RN  Name of Charge Nurse/RN Notified Christy  Date Charge Nurse/RN Notified 08/14/20  Time Charge Nurse/RN Notified 1530  Document  Patient Outcome Other (Comment) (No intervention)  Progress note created (see row info) Yes

## 2020-08-14 NOTE — Evaluation (Signed)
Occupational Therapy Evaluation Patient Details Name: Anthony [name] MRN: [id number] DOB: 02/18/1956 Today's Date: 08/14/2020    History of Present Illness Anthony [name] is a 65 y.o. male presenting with urosepsis and A Fib in RVR. 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 Patient found by social work in his motel room naked in urine and feces with mild confusion. Upon EMS arrival, he was noted to be tachycardic with HR 210 with soft BP,.   Clinical Impression   Pt PTA: Pt recent d/c from [facility] and pt per chart did not take medications and had "friends stop by and bring alcohol." Pt currently, limited by decreased cognitive status, decreased abilityto care for self and decreased safety awareness. Pt minA to modA overall for ADL and minA for mobility a short distance in room. Pt with cognitive deficits and requires SNF at D/C. Pt's O2>90% on RA. HR <120 BPM with exertion. Pt would benefit from continued OT skilled services for ADL, mobility and safety. OT following acutely.    Follow Up Recommendations  SNF    Equipment Recommendations  None recommended by OT    Recommendations for Other Services PT consult     Precautions / Restrictions Precautions Precautions: Fall Restrictions Weight Bearing Restrictions: No      Mobility Bed Mobility Overal bed mobility: Needs Assistance Bed Mobility: Supine to Sit;Sit to Supine     Supine to sit: Min assist Sit to supine: Min assist   General bed mobility comments: MinA for sequencing    Transfers Overall transfer level: Needs assistance Equipment used: 1 person hand held assist Transfers: Sit to/from [facility] Sit to Stand: Min assist Stand pivot transfers: Min assist       General transfer comment: pt with unsteady gait; was more steady with hand heldA; plan to use RW for stability next time.    Balance Overall balance  assessment: Needs assistance   Sitting balance-Leahy Scale: Fair       Standing balance-Leahy Scale: Poor Standing balance comment: requiring HHA                           ADL either performed or assessed with clinical judgement   ADL Overall ADL's : Needs assistance/impaired Eating/Feeding: Set up Eating/Feeding Details (indicate cue type and reason): pt drinking from cup with increased time Grooming: Min guard;Standing   Upper Body Bathing: Minimal assistance;Sitting   Lower Body Bathing: Minimal assistance;Sitting/lateral leans;Sit to/from stand   Upper Body Dressing : Minimal assistance;Sitting   Lower Body Dressing: Minimal assistance;Sitting/lateral leans;Sit to/from stand Lower Body Dressing Details (indicate cue type and reason): set-upA to donn socks in bed with hip hike technique Toilet Transfer: Minimal assistance;Cueing for safety;Cueing for sequencing;Ambulation Toilet Transfer Details (indicate cue type and reason): HHA for  mobility Toileting- Clothing Manipulation and Hygiene: Moderate assistance;Sit to/from stand       Functional mobility during ADLs: Minimal assistance;Cueing for safety;Cueing for sequencing General ADL Comments: Pt limited by decreased cognitive status, decreased abilityto care for self and decreased safety awareness. Pt's O2>90% on RA. HR <120 BPM with exertion     Vision Baseline Vision/History: Wears glasses Wears Glasses: Reading only Patient Visual Report: No change from baseline Vision Assessment?: No apparent visual deficits     Perception     Praxis      Pertinent Vitals/Pain Pain Assessment: Faces Faces Pain Scale:  No hurt Pain Intervention(s): Monitored during session     Hand Dominance Right   Extremity/Trunk Assessment Upper Extremity Assessment Upper Extremity Assessment: Generalized weakness   Lower Extremity Assessment Lower Extremity Assessment: Generalized weakness   Cervical / Trunk  Assessment Cervical / Trunk Assessment: Kyphotic   Communication Communication Communication: Expressive difficulties;Other (comment) (pt murmuring speech, unintelligible)   Cognition Arousal/Alertness: Lethargic Behavior During Therapy: Flat affect Overall Cognitive Status: Impaired/Different from baseline Area of Impairment: Orientation;Attention;Memory;Following commands;Awareness;Problem solving                 Orientation Level: Disoriented to;Time;Situation Current Attention Level: Selective Memory: Decreased short-term memory Following Commands: Follows one step commands with increased time;Follows multi-step commands inconsistently Safety/Judgement: Decreased awareness of safety Awareness: Intellectual Problem Solving: Requires verbal cues;Requires tactile cues;Decreased initiation General Comments: Pt unable to verbalize anything that made sense.   General Comments  O2>90% and HR <120 BPM with exertion    Exercises     Shoulder Instructions      Home Living Family/patient expects to be discharged to:: Unsure                                 Additional Comments: Pt was recently d/c'd to motel, but was back in this ED after 3 days after a wellness check with AMS. Pt's brother picked him up, but pt reports "I cannot go to his house."      Prior Functioning/Environment Level of Independence: Independent        Comments: Pt was independent with ADL and mobility at time of d/c on 1/21. Pt prior to a month ago, pt was living on his own, but recently evicted from apartment.        OT Problem List: Decreased strength;Decreased range of motion;Decreased activity tolerance;Impaired balance (sitting and/or standing);Decreased safety awareness;Decreased knowledge of use of DME or AE;Cardiopulmonary status limiting activity;Impaired UE functional use;Pain;Increased edema      OT Treatment/Interventions: Self-care/ADL training;Therapeutic exercise;Energy  conservation;DME and/or AE instruction;Therapeutic activities;Patient/family education;Balance training;Cognitive remediation/compensation    OT Goals(Current goals can be found in the care plan section) Acute Rehab OT Goals Patient Stated Goal: to walk OT Goal Formulation: With patient ADL Goals Pt Will Perform Grooming: with min guard assist;standing Pt Will Transfer to Toilet: with supervision;ambulating;bedside commode Pt Will Perform Toileting - Clothing Manipulation and hygiene: with supervision;sit to/from stand Pt/caregiver will Perform Home Exercise Program: Increased strength;Both right and left upper extremity;With written HEP provided Additional ADL Goal #1: Pt will complete x10 mins of ADL tasks with 1 seated rest break with minguardA overall Additional ADL Goal #2: Pt will perform higher level cognitive task with minimal cues to attend.  OT Frequency: Min 2X/week   Barriers to D/C:            Co-evaluation              AM-PAC OT "6 Clicks" Daily Activity     Outcome Measure Help from another person eating meals?: A Yi Help from another person taking care of personal grooming?: A Dickens Help from another person toileting, which includes using toliet, bedpan, or urinal?: A Mackiewicz Help from another person bathing (including washing, rinsing, drying)?: A Lot Help from another person to put on and taking off regular upper body clothing?: A Spoon Help from another person to put on and taking off regular lower body clothing?: A Lot 6 Click Score: 16   End of Session  Equipment Utilized During Treatment: Teacher, adult education Communication: Mobility status  Activity Tolerance: Patient tolerated treatment well Patient left: in bed;with call bell/phone within reach  OT Visit Diagnosis: Unsteadiness on feet (R26.81);Other symptoms and signs involving cognitive function                Time: 8338-2505 OT Time Calculation (min): 31 min Charges:  OT General Charges $OT  Visit: 1 Visit OT Evaluation $OT Eval Moderate Complexity: 1 Mod OT Treatments $Self Care/Home Management : 8-22 mins  Jefferey Pica, OTR/L Acute Rehabilitation Services Pager: [phone] Office: [phone]   Vanessia Bokhari C 08/14/2020, 1:40 PM

## 2020-08-14 NOTE — ED Notes (Signed)
Called resident Re pt diarrhea x 3.  He stated he does not feel stool needs to be tested at this time.

## 2020-08-14 NOTE — Consult Note (Addendum)
Cardiology Consultation:   Patient ID: Anthony [name] MRN: [id number]; DOB: May 07, 1956  Admit date: 08/13/2020 Date of Consult: 08/14/2020  Primary Care Provider: Carollee Leitz, MD [facility] Cardiologist: No primary care provider on file.  Tees Toh Electrophysiologist:  None    Patient Profile:   Anthony [name] is a 65 y.o. [name] with a hx of Afib, HTN, ETOH abuse, Diastolic HF, chronic back pain, depression and unprovoked PE who is being seen today for the evaluation of Afib at the request of Dr. Gwendlyn Deutscher.  History of Present Illness:   Anthony [name] is a 65 year old [name] with past medical history noted above.  He has never been evaluated by cardiology in the past.  Was recently admitted 1/2-1/22 for alcohol withdrawal which was complicated by altered mental status and development of new onset atrial fibrillation.  During that admission echo noted EF of 55 to 60% with no regional wall motion abnormality, grade 2 diastolic dysfunction. It was ultimately decided that he would be discharged to a hotel with social work following up with him as an outpatient.   Presented back to the ED on 1/25 after social worker went out to check on him at the motel and found him naked in his room covered in urine and feces confused.  Labs on admission showed sodium 137, potassium 3.2, creatinine 0.87, magnesium 1.7, WBC 12, hemoglobin 10.9, lactic acid 2.4>> 0.5.  UDS was positive for benzodiazepines.  Positive for moderate leukocytes.  EKG showed atrial fibrillation with RVR with a rate of 150 bpm.  He was admitted for with severe sepsis felt to be likely related to urinary origin.  Started on IV antibiotics.  In regards to his atrial fibrillation he was placed on IV amiodarone with Xarelto continued.  There has been concern over him having poor p.o. intake and Xarelto was transitioned to Lovenox.  Also placed on CIWA protocol.  Cardiology has been consulted in regards to assistance with rate management with his  atrial fibrillation.   Past Medical History:  Diagnosis Date  . Actinic keratosis 11/27/2016  . Alcohol abuse with alcohol-induced mood disorder ([place]) 07/18/2018  . Alcohol use disorder, severe, dependence ([place]) 09/16/2006   05/22/2015 Argumentative, belligerent and verbally abusive to staff per his note from [place]   . Alcoholism ([place])   . Anxiety state 04/25/2018  . Chronic low back pain 09/16/2006   Followed by NS.  Per his chart from [place], he fell off two storyhouse roof while cleaning his brother's gutters and sustained compression fracture of L3, L4 and L5 and fracture of right transverse process at L3 and nondisplaced fracture of left glenoid rim many years ago. He had multiple imaging in [place] including Lumbar MRI in 2016 which showed moderate to severe spinal canal stenos  . Delirium tremens ([place]) 2/Anthony/2019  . Depression   . Dry eye 11/23/2016  . Hiatal hernia 09/14/2016   Status Collis gastroplasty and Nissen's fundoplication on 20/35/5974 in [place]  . History of delirium tremons 07/22/2020   DTs during 10/2016 08/2017. And 12/2017 admissions   . History of pulmonary embolus (PE) 11/02/2016   Unprovoked 11/01/2016. Xarelto from 11/01/16 to 09/08/2017.  Marland Kitchen Hydrocele    Surgically corrected  . Hypertension   . Hypoalbuminemia due to protein-calorie malnutrition ([place]) 07/22/2020  . Lumbar compression fracture ([place])   . Multiple rib fractures 07/22/2019   Left rib details XR: left ribs demonstrate multiple remote rib  . Pancytopenia ([place])   . Rhabdomyolysis 07/22/2020  . Skin cancer  exciced 2017  . Tinea versicolor 06/08/2013  . Tobacco use disorder 07/22/2020  . Tooth infection 04/25/2018  . Trigger finger, acquired 11/18/2012  . Ulnar tunnel syndrome of right wrist 11/08/2012    Past Surgical History:  Procedure Laterality Date  . CATARACT EXTRACTION    . HIATAL HERNIA REPAIR  2016  . HYDROCELE EXCISION / REPAIR  2010  . SKIN CANCER EXCISION Left    Excised  2017     Home Medications:  Prior to Admission medications   Medication Sig Start Date End Date Taking? Authorizing Provider  ferrous sulfate 325 (65 FE) MG tablet Take 1 tablet (325 mg total) by mouth 2 (two) times a week. 08/08/20  Yes Zola Button, MD  folic acid (FOLVITE) 1 MG tablet Take 1 tablet (1 mg total) by mouth daily. 08/09/20  Yes Welborn, Ryan, DO  magnesium oxide (MAG-OX) 400 (241.3 Mg) MG tablet Take 1 tablet (400 mg total) by mouth 2 (two) times daily. 08/09/20  Yes Welborn, Ryan, DO  metoprolol succinate (TOPROL-XL) 25 MG 24 hr tablet Take 0.5 tablets (12.5 mg total) by mouth daily. 08/09/20  Yes Welborn, Ryan, DO  rivaroxaban (XARELTO) 20 MG TABS tablet Take 1 tablet (20 mg total) by mouth daily with supper. 08/08/20  Yes Zola Button, MD  sertraline (ZOLOFT) 100 MG tablet Take 2 tablets (200 mg total) by mouth daily. 08/09/20  Yes Welborn, Ryan, DO  thiamine 100 MG tablet Take 1 tablet (100 mg total) by mouth daily. 08/09/20  Yes Welborn, Ryan, DO  cetirizine (ZYRTEC) 10 MG tablet Take 1 tablet (10 mg total) by mouth daily. Patient not taking: Reported on 07/24/2020 01/07/18   Raiford Noble Latif, DO  hydroxypropyl methylcellulose / hypromellose (ISOPTO TEARS / GONIOVISC) 2.5 % ophthalmic solution Place 1 drop into both eyes as needed for dry eyes. Patient not taking: Reported on 07/24/2020 01/07/18   Kerney Elbe, DO    Inpatient Medications: Scheduled Meds: . enoxaparin (LOVENOX) injection  60 mg Subcutaneous Q12H  . [START ON 08/15/2020] ferrous sulfate  325 mg Oral Once per day on Mon Thu  . folic acid  1 mg Oral Daily  . magnesium oxide  400 mg Oral BID  . metoprolol succinate  25 mg Oral Daily  . sertraline  200 mg Oral Daily  . thiamine  100 mg Oral Daily   Continuous Infusions: . amiodarone 30 mg/hr (08/14/20 1419)  . cefTRIAXone (ROCEPHIN)  IV     PRN Meds: hydroxypropyl methylcellulose / hypromellose, metoprolol tartrate  Allergies:   No Known  Allergies  Social History:   Social History   Socioeconomic History  . Marital status: Single    Spouse name: Not on file  . Number of children: Not on file  . Years of education: Not on file  . Highest education level: Not on file  Occupational History  . Occupation: unemployed  Tobacco Use  . Smoking status: Never Smoker  . Smokeless tobacco: Current User    Types: Snuff  Vaping Use  . Vaping Use: Never used  Substance and Sexual Activity  . Alcohol use: Yes    Alcohol/week: 43.0 standard drinks    Types: 42 Cans of beer, 1 Standard drinks or equivalent per week    Comment: daily use-6 beers daily and whiskey when he has it.  . Drug use: Yes    Types: Marijuana    Comment: occasional-none this year per pt  . Sexual activity: Yes    Birth control/protection: None  Other Topics Concern  . Not on file  Social History Narrative   Lives with a friend. Has children but hasn't met them in a while. He doesn't know where they live.    Social Determinants of Health   Financial Resource Strain: Not on file  Food Insecurity: Not on file  Transportation Needs: Not on file  Physical Activity: Not on file  Stress: Not on file  Social Connections: Not on file  Intimate Partner Violence: Not on file    Family History:    Family History  Problem Relation Age of Onset  . Heart attack Father        died at 47 years  . Dementia Father   . Stroke Father   . Cancer Sister   . Colon cancer Neg Hx   . Colon polyps Neg Hx   . Esophageal cancer Neg Hx   . Stomach cancer Neg Hx   . Rectal cancer Neg Hx      ROS:  Please see the history of present illness.   All other ROS reviewed and negative.     Physical Exam/Data:   Vitals:   08/14/20 1245 08/14/20 1315 08/14/20 1319 08/14/20 1320  BP: 100/77 (!) 116/100    Pulse: (!) 113 (!) 126    Resp: 20 (!) 21    Temp:   98.3 F (36.8 C)   TempSrc:   Oral   SpO2: (!) 80% (!) 66%  92%    Intake/Output Summary (Last 24 hours)  at 08/14/2020 1542 Last data filed at 08/14/2020 0421 Gross per 24 hour  Intake 250 ml  Output -  Net 250 ml   Last 3 Weights 08/09/2020 08/08/2020 08/07/2020  Weight (lbs) 138 lb 9.6 oz 137 lb 11.2 oz 138 lb 11.2 oz  Weight (kg) 62.869 kg 62.46 kg 62.914 kg     There is no height or weight on file to calculate BMI.  General: Thin disheveled older white [name]. Confused, unable to state where he is.  HEENT: normal Lymph: no adenopathy Neck: no JVD Endocrine:  No thryomegaly Vascular: No carotid bruits; FA pulses 2+ bilaterally without bruits  Cardiac:  normal S1, S2; tachy, irreg irreg; no murmur  Lungs:  clear to auscultation bilaterally, no wheezing, rhonchi or rales  Abd: soft, nontender, no hepatomegaly  Ext: no edema Musculoskeletal:  No deformities, BUE and BLE strength normal and equal Skin: warm and dry  Neuro:  CNs 2-12 intact, no focal abnormalities noted  EKG:  The EKG was personally reviewed and demonstrates: Atrial fibrillation with RVR, 150s bpm Telemetry:  Telemetry was personally reviewed and demonstrates:  Atrial fib rates in the 110-120s  Relevant CV Studies:  Echo: 07/26/20  IMPRESSIONS    1. Left ventricular ejection fraction, by estimation, is 55 to 60%. The  left ventricle has normal function. The left ventricle has no regional  wall motion abnormalities. Left ventricular diastolic parameters are  consistent with Grade II diastolic  dysfunction (pseudonormalization).  2. Right ventricular systolic function is normal. The right ventricular  size is normal. Tricuspid regurgitation signal is inadequate for assessing  PA pressure.  3. The mitral valve is normal in structure. Trivial mitral valve  regurgitation. No evidence of mitral stenosis.  4. The aortic valve is tricuspid. Aortic valve regurgitation is not  visualized. No aortic stenosis is present.  5. Aortic dilatation noted. There is mild dilatation of the aortic root,  measuring 39 mm.  6. The  inferior vena cava is dilated  in size with >50% respiratory  variability, suggesting right atrial pressure of 8 mmHg.   Laboratory Data:  High Sensitivity Troponin:  No results for input(s): TROPONINIHS in the last 720 hours.   Chemistry Recent Labs  Lab 08/13/20 1828 08/14/20 0304  NA 137 136  K 3.2* 3.2*  CL 108 108  CO2 17* 17*  GLUCOSE 116* 105*  BUN 34* 30*  CREATININE 0.87 0.82  CALCIUM 9.0 8.6*  GFRNONAA >60 >60  ANIONGAP 12 11    Recent Labs  Lab 08/13/20 1828 08/14/20 0304  PROT 6.9 6.0*  ALBUMIN 2.4* 2.2*  AST 45* 64*  ALT 30 34  ALKPHOS 84 78  BILITOT 1.4* 1.2   Hematology Recent Labs  Lab 08/13/20 1828 08/14/20 0304  WBC 12.0* 9.6  RBC 3.Anthony* 2.83*  HGB 10.9* 10.1*  HCT 34.1* 30.2*  MCV 108.9* 106.7*  MCH 34.8* 35.7*  MCHC 32.0 33.4  RDW 12.6 12.5  PLT 299 261   BNPNo results for input(s): BNP, PROBNP in the last 168 hours.  DDimer No results for input(s): DDIMER in the last 168 hours.   Radiology/Studies:  No results found.   Assessment and Plan:   Anthony [name] is a 65 y.o. [name] with a hx of Afib, HTN, ETOH abuse, Diastolic HF, chronic back pain, depression and unprovoked PE who is being seen today for the evaluation of Afib at the request of Dr. Gwendlyn Deutscher.  1. Atrial fibrillation with RVR: This was diagnosed during his previous admission.  He was discharged on Toprol 12.5 mg daily along with Xarelto 20 mg daily.  He did not take any of his medications after discharge. Presented back with altered mental status, and sepsis.  Heart rates were initially in the 140-150 range.  Currently treated with IV amiodarone bolus and drip.  Rates have improved.  Blood pressures have been labile.  He has not been consuming meals therefore his Xarelto was transitioned to Lovenox. --We will continue with rate controlling strategy for now as patient remains altered, acutely ill. Not a candidate for cardioversion.  -- This patients CHA2DS2-VASc Score of 2. Does have  hx of unprovoked PE as well. Given his hx of ETOH use, altered mental status and social issues, not sure that he would be a good long term anticoagulation candidate unless he discharged to a controlled environment like SNF.  2. AMS with sepsis/UTI: met sepsis criteria on admission. Treated with antibiotics per primary. BC neg to date. Lactic improved. Remains confused, when asked where he is states " I would need many tiny flags to hold to say where I am".  3. ETOH use: On CIWA protocol   4. Hypokalemia/hypomag: Supplement per primary  5. Hx of PE: unprovoked per chart review from 2018. Had been on Xarelto.   6. Social issues: had been discharged to motel after last admission. Noted that he was unable to appropriately care for himself during the brief time he was discharged. SNF recommended during last admission but he denied.     Risk Assessment/Risk Scores:    CHA2DS2-VASc Score = 2  This indicates a 2.2% annual risk of stroke. The patient's score is based upon: CHF History: Yes HTN History: Yes Diabetes History: No Stroke History: No Vascular Disease History: No Age Score: 0 Gender Score: 0   For questions or updates, please contact [place] Please consult [email or web address] for contact info under    Signed, Reino Bellis, NP  08/14/2020 3:42 PM   I have personally seen  and examined this patient. I agree with the assessment and plan as outlined above.  65 yo [name] with history of PE, etoh abuse, atrial fibrillation, HTN, depression admitted with altered mental status and felt to have urosepsis and atrial fibrillation with RVR. He has been started on IV amiodarone. He was admitted from 07/21/20-08/08/20 with altered mental status and had etoh withdrawal. Noted to be in atrial fibrillation during that admission and started on Xarelto. Cardiology did not see him earlier this month. Echo January 2022 with normal LV systolic function. He was discharged to a hotel and found down today  covered in urine and confused when the social worker came to visit.  Labs reviewed by me.  EKG personally reviewed by me and shows atrial fibrillation with RVR, non-specific ST and T wave abnormalities.  My exam:  General: Cachectic [name] in NAD. He is confused and not oriented to place. SKIN: warm, dry. No rashes. Neuro: No focal deficits  Musculoskeletal: Muscle strength 5/5 all ext  Psychiatric: Mood and affect normal  Neck: No JVD Lungs:Clear bilaterally, no wheezes, rhonci, crackles Cardiovascular: Irregular, tachycardic. No murmurs, gallops or rubs. Abdomen:Soft. Bowel sounds present. Extremities: No lower extremity edema.  Plan: Atrial fibrillation with RVR: Agree with IV amiodarone while he is recovering from his infection/sepsis. He is on Lovenox. I would not restart Xarelto given his current social situation and apparent inability to comply with medications. No need to repeat his echo at this time. We will follow with you.   Lauree Chandler 08/14/2020 3:46 PM

## 2020-08-14 NOTE — ED Notes (Signed)
Pt noted to be soaked in urine, attempting to get out of bed. This RN & NT Erin changed linens on bed, redirected pt to proper position, condom cath applied.

## 2020-08-14 NOTE — Progress Notes (Signed)
Pt was seen for assistance with mobility and transfers, and increased his stability in walking with RW.  He is limited for control of balance due to attending to tasks, and in addition has ROM deficits of ankles and strength changes in legs.  These goals of acute PT are to be the focus, as well as working on pt plan to figure out his discharge location.  08/14/20 2000  PT Visit Information  Last PT Received On 08/14/20  Assistance Needed +1  History of Present Illness Anthony [name] is a 65 y.o. male presenting with urosepsis and A Fib in RVR. 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 Patient found by social work in his motel room naked in urine and feces with mild confusion. Upon EMS arrival, he was noted to be tachycardic with HR 210 with soft BP,.  Precautions  Precautions Fall  Precaution Comments incontinent  Restrictions  Weight Bearing Restrictions No  Home Living  Family/patient expects to be discharged to: Unsure  Living Arrangements Alone  Type of [place] None  Additional Comments Pt was recently d/c'd to motel, but was back in this ED after 3 days after a wellness check with AMS. Pt's brother picked him up, but pt reports "I cannot go to his house."  Prior Function  Level of Independence Independent  Comments Pt was independent with ADL and mobility at time of d/c on 1/21. Pt prior to a month ago, pt was living on his own, but recently evicted from apartment.  Communication  Communication Expressive difficulties  Pain Assessment  Pain Assessment No/denies pain  Cognition  Arousal/Alertness Lethargic  Behavior During Therapy Flat affect  Overall Cognitive Status Impaired/Different from baseline  Area of Impairment Problem solving;Awareness;Safety/judgement;Following commands;Memory;Orientation;Attention  Orientation Level Place;Time;Situation  Current Attention Level  Selective  Memory Decreased recall of precautions;Decreased short-term memory  Following Commands Follows one step commands inconsistently;Follows one step commands with increased time  Safety/Judgement Decreased awareness of safety;Decreased awareness of deficits  Awareness Intellectual  Problem Solving Slow processing;Requires verbal cues;Requires tactile cues  General Comments Nods yes no appropriately  Upper Extremity Assessment  Upper Extremity Assessment Overall WFL for tasks assessed  Lower Extremity Assessment  Lower Extremity Assessment Generalized weakness  Cervical / Trunk Assessment  Cervical / Trunk Assessment Kyphotic  Bed Mobility  Overal bed mobility Needs Assistance  Bed Mobility Sit to Supine  Supine to sit Min assist  Sit to supine Min assist  Transfers  Overall transfer level Needs assistance  Equipment used 1 person hand held assist  Transfers Sit to/from Stand  Sit to Stand Min guard  Stand pivot transfers Min guard  General transfer comment RW to support initial standing effort  Ambulation/Gait  Ambulation/Gait assistance Min guard  Gait Distance (Feet) 30 Feet  Assistive device Rolling walker (2 wheeled)  Gait Pattern/deviations Decreased stride length;Step-to pattern;Wide base of support  General Gait Details pt is standing in the center of the room when PT arrived, pulled off cath and had large episode of incontinence in center of floor  Gait velocity reduced  Gait velocity interpretation <1.31 ft/sec, indicative of household ambulator  Balance  Overall balance assessment Needs assistance  Sitting-balance support Feet supported  Sitting balance-Leahy Scale Fair  Sitting balance - Comments standing with fair balance but unable to move due to floor being convered in incontinent material  Standing balance support Single extremity supported  Standing balance-Leahy Scale Poor  Standing balance comment Fair with walker but cannot maneuver walker alone   General Comments  General comments (skin integrity, edema, etc.) Pt was assisted to walk on RW in his room, unable to leave room as pt is possibly going to continue incontinence  PT - End of Session  Equipment Utilized During Treatment Gait belt  Activity Tolerance Patient tolerated treatment well  Patient left in bed;with call bell/phone within reach  Nurse Communication Mobility status  PT Assessment  PT Recommendation/Assessment Patient needs continued PT services  PT Visit Diagnosis Unsteadiness on feet (R26.81)  PT Problem List Decreased strength;Decreased activity tolerance;Decreased balance;Decreased mobility;Decreased cognition;Decreased safety awareness;Cardiopulmonary status limiting activity  Barriers to Discharge Decreased caregiver support  Barriers to Discharge Comments unable to self mobilize safely  PT Plan  PT Frequency (ACUTE ONLY) Min 3X/week  PT Treatment/Interventions (ACUTE ONLY) DME instruction;Gait training;Functional mobility training;Stair training;Therapeutic activities;Therapeutic exercise;Balance training;Cognitive remediation;Patient/family education  AM-PAC PT "6 Clicks" Mobility Outcome Measure (Version 2)  Help needed turning from your back to your side while in a flat bed without using bedrails? 3  Help needed moving from lying on your back to sitting on the side of a flat bed without using bedrails? 3  Help needed moving to and from a bed to a chair (including a wheelchair)? 3  Help needed standing up from a chair using your arms (e.g., wheelchair or bedside chair)? 3  Help needed to walk in hospital room? 3  Help needed climbing 3-5 steps with a railing?  3  6 Click Score 18  Consider Recommendation of Discharge To: Home with [facility]  PT Recommendation  Follow Up Recommendations Home health PT  PT equipment None recommended by PT  Individuals Consulted  Consulted and Agree with Results and Recommendations Patient  Acute Rehab PT Goals  Patient Stated  Goal to walk  PT Goal Formulation With patient  Time For Goal Achievement 09/25/20  Potential to Achieve Goals Fair  PT Time Calculation  PT Start Time (ACUTE ONLY) 1501  PT Stop Time (ACUTE ONLY) 1533  PT Time Calculation (min) (ACUTE ONLY) 32 min  PT General Charges  $$ ACUTE PT VISIT 1 Visit  PT Evaluation  $PT Eval Moderate Complexity 1 Mod  PT Treatments  $Gait Training 8-22 mins  Written Expression  Dominant Hand Right  Mee Hives, PT MS Acute Rehab Dept. Number: [place]

## 2020-08-15 DIAGNOSIS — Z659 Problem related to unspecified psychosocial circumstances: Secondary | ICD-10-CM | POA: Diagnosis not present

## 2020-08-15 DIAGNOSIS — N390 Urinary tract infection, site not specified: Secondary | ICD-10-CM | POA: Diagnosis not present

## 2020-08-15 DIAGNOSIS — I4891 Unspecified atrial fibrillation: Secondary | ICD-10-CM | POA: Diagnosis not present

## 2020-08-15 LAB — CBC
HCT: 28.7 % — ABNORMAL LOW (ref 39.0–52.0)
Hemoglobin: 9.7 g/dL — ABNORMAL LOW (ref 13.0–17.0)
MCH: 35.5 pg — ABNORMAL HIGH (ref 26.0–34.0)
MCHC: 33.8 g/dL (ref 30.0–36.0)
MCV: 105.1 fL — ABNORMAL HIGH (ref 80.0–100.0)
Platelets: 259 10*3/uL (ref 150–400)
RBC: 2.73 MIL/uL — ABNORMAL LOW (ref 4.22–5.81)
RDW: 12.7 % (ref 11.5–15.5)
WBC: 7.7 10*3/uL (ref 4.0–10.5)
nRBC: 0 % (ref 0.0–0.2)

## 2020-08-15 LAB — COMPREHENSIVE METABOLIC PANEL
ALT: 53 U/L — ABNORMAL HIGH (ref 0–44)
AST: 99 U/L — ABNORMAL HIGH (ref 15–41)
Albumin: 2.2 g/dL — ABNORMAL LOW (ref 3.5–5.0)
Alkaline Phosphatase: 90 U/L (ref 38–126)
Anion gap: 10 (ref 5–15)
BUN: 18 mg/dL (ref 8–23)
CO2: 18 mmol/L — ABNORMAL LOW (ref 22–32)
Calcium: 8.8 mg/dL — ABNORMAL LOW (ref 8.9–10.3)
Chloride: 108 mmol/L (ref 98–111)
Creatinine, Ser: 0.64 mg/dL (ref 0.61–1.24)
GFR, Estimated: >60 mL/min (ref >60)
Glucose, Bld: 91 mg/dL (ref 70–99)
Potassium: 3.4 mmol/L — ABNORMAL LOW (ref 3.5–5.1)
Sodium: 136 mmol/L (ref 135–145)
Total Bilirubin: 0.9 mg/dL (ref 0.3–1.2)
Total Protein: 6.3 g/dL — ABNORMAL LOW (ref 6.5–8.1)

## 2020-08-15 MED ORDER — POTASSIUM CHLORIDE CRYS ER 20 MEQ PO TBCR
40.0000 meq | EXTENDED_RELEASE_TABLET | Freq: Two times a day (BID) | ORAL | Status: AC
  Administered 2020-08-15 – 2020-08-16 (×4): 40 meq via ORAL
  Filled 2020-08-15 (×4): qty 2

## 2020-08-15 MED ORDER — METOPROLOL SUCCINATE ER 50 MG PO TB24
50.0000 mg | ORAL_TABLET | Freq: Every day | ORAL | Status: DC
  Administered 2020-08-16: 50 mg via ORAL
  Filled 2020-08-15: qty 1

## 2020-08-15 MED ORDER — SODIUM CHLORIDE 0.9 % IV BOLUS
500.0000 mL | Freq: Once | INTRAVENOUS | Status: AC
  Administered 2020-08-15: 500 mL via INTRAVENOUS

## 2020-08-15 MED ORDER — SODIUM CHLORIDE 0.9 % IV SOLN
INTRAVENOUS | Status: DC | PRN
  Administered 2020-08-15: 250 mL via INTRAVENOUS

## 2020-08-15 NOTE — Progress Notes (Addendum)
Progress Note  Patient Name: Anthony [name] Date of Encounter: 08/15/2020  [facility] Cardiologist: Lauree Chandler, MD   Subjective   Laying in bed. Does answer questions when asked.   Inpatient Medications    Scheduled Meds: . enoxaparin (LOVENOX) injection  60 mg Subcutaneous Q12H  . ferrous sulfate  325 mg Oral Once per day on Mon Thu  . folic acid  1 mg Oral Daily  . magnesium oxide  400 mg Oral BID  . metoprolol succinate  25 mg Oral Daily  . sertraline  200 mg Oral Daily  . thiamine  100 mg Oral Daily   Continuous Infusions: . amiodarone 30.06 mg/hr (08/15/20 0500)  . cefTRIAXone (ROCEPHIN)  IV Stopped (08/14/20 2205)   PRN Meds: hydroxypropyl methylcellulose / hypromellose, metoprolol tartrate   Vital Signs    Vitals:   08/14/20 2000 08/15/20 0103 08/15/20 0300 08/15/20 0834  BP: 104/69 109/86 98/81 94/69   Pulse: (!) 113 (!) 113 (!) 112 98  Resp: 20 18  20   Temp: 98.7 F (37.1 C) 98.6 F (37 C)  98.7 F (37.1 C)  TempSrc: Oral Oral  Oral  SpO2: 96% 95%  95%  Weight:   62.7 kg     Intake/Output Summary (Last 24 hours) at 08/15/2020 0912 Last data filed at 08/14/2020 1900 Gross per 24 hour  Intake 78.07 ml  Output -  Net 78.07 ml   Last 3 Weights 08/15/2020 08/09/2020 08/08/2020  Weight (lbs) 138 lb 3.2 oz 138 lb 9.6 oz 137 lb 11.2 oz  Weight (kg) 62.687 kg 62.869 kg 62.46 kg      Telemetry    Afib rates 100-110s - Personally Reviewed  ECG    No new tracing this morning.   Physical Exam  Disheveled thin male, appearing older than stated age GEN: No acute distress.   Neck: No JVD Cardiac: Irreg Irreg, no murmurs, rubs, or gallops.  Respiratory: Clear to auscultation bilaterally. GI: Soft, nontender, non-distended  MS: No edema; No deformity. Neuro:  Nonfocal, confused ( but this appears to be his baseline per notes)   Labs    High Sensitivity Troponin:  No results for input(s): TROPONINIHS in the last 720 hours.     Chemistry Recent Labs  Lab 08/13/20 1828 08/14/20 0304 08/15/20 0321  NA 137 136 136  K 3.2* 3.2* 3.4*  CL 108 108 108  CO2 17* 17* 18*  GLUCOSE 116* 105* 91  BUN 34* 30* 18  CREATININE 0.87 0.82 0.64  CALCIUM 9.0 8.6* 8.8*  PROT 6.9 6.0* 6.3*  ALBUMIN 2.4* 2.2* 2.2*  AST 45* 64* 99*  ALT 30 34 53*  ALKPHOS 84 78 90  BILITOT 1.4* 1.2 0.9  GFRNONAA >60 >60 >60  ANIONGAP 12 11 10      Hematology Recent Labs  Lab 08/13/20 1828 08/14/20 0304 08/15/20 0321  WBC 12.0* 9.6 7.7  RBC 3.13* 2.83* 2.73*  HGB 10.9* 10.1* 9.7*  HCT 34.1* 30.2* 28.7*  MCV 108.9* 106.7* 105.1*  MCH 34.8* 35.7* 35.5*  MCHC 32.0 33.4 33.8  RDW 12.6 12.5 12.7  PLT 299 261 259    BNPNo results for input(s): BNP, PROBNP in the last 168 hours.   DDimer No results for input(s): DDIMER in the last 168 hours.   Radiology    No results found.  Cardiac Studies   N/a   Patient Profile     64 y.o. male with a hx of Afib, HTN, ETOH abuse, Diastolic HF, chronic back pain,  depression and unprovoked PE who was seen for the evaluation of Afib.  Assessment & Plan    1. Atrial fibrillation with RVR: This was diagnosed during his previous admission.  He was discharged on Toprol 12.5 mg daily along with Xarelto 20 mg daily.  He did not take any of his medications after discharge. Heart rates initially in the 140-150 range. Now improved into the 100s while on amiodarone gtt. Blood pressures remain soft, currently not able to tolerate BB therapy.  -- He has not been consuming meals therefore his Xarelto was transitioned to Lovenox. We will continue with rate controlling strategy for now. Would not consider a good candidate for cardioversion.  -- This patients CHA2DS2-VASc Score of 2. Does have hx of unprovoked PE as well. Given his hx of ETOH use, altered mental status and social issues, not sure that he would be a good long term anticoagulation candidate unless he discharged to a controlled environment like  SNF.  2. AMS with sepsis/UTI: met sepsis criteria on admission. Treated with antibiotics per primary. BC neg to date. Lactic improved. Blood pressures are soft, though he has not been eating or drinking much.   3. ETOH use: On CIWA protocol   4. Hypokalemia/hypomag: Supplement per primary  5. Hx of PE: unprovoked per chart review from 2018. Had been on Xarelto.   6. Social issues: had been discharged to motel after last admission. Noted that he was unable to appropriately care for himself during the brief time he was discharged. SNF recommended during last admission but he denied.   For questions or updates, please contact [facility] Please consult [email or web address] for contact info under        Signed, Reino Bellis, NP  08/15/2020, 9:12 AM    I have personally seen and examined this patient. I agree with the assessment and plan as outlined above.  Sinus this am. Convert IV amiodarone to amiodarone 200 mg po BID. I would consider him a poor candidate for long term anti-coagulation given his poor social situation.  -Recommend IV fluid hydration given soft blood pressures. He seems to be dry.   Lauree Chandler 08/15/2020 10:34 AM

## 2020-08-15 NOTE — Progress Notes (Addendum)
Family Medicine Teaching Service Daily Progress Note Intern Pager: [phone]  Patient name: Anthony [name] Medical record number: [id number] Date of birth: 1955-11-03 Age: 65 y.o. Gender: male  Primary Care Provider: Carollee Leitz, MD Consultants: Cardiology Code Status: Full  Pt Overview and Major Events to Date:  1/25- admitted  Assessment and Plan: Anthony A Littleis a 65 y.o.malepresenting with urosepsis and A Fib in RVR due to medication non-adherence. 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  A Fib with RVR Heart rate controlled <110 on exam today.Patient is asymptomatic. Cardiology does not believe pt is a candidate for cardioversion. Still not eating meals and Cardiology agrees that he will not adhere to Xarelto on an outpt basis. We will therefore continue with therapeutic Lovenox. - Increase metoprolol succinate to 50mg  daily  - Transition amiodarone to PO 200mg  BID per cardiology recommendation - D/c PRN IV metoprolol  - 58mL NS bolus per cards rec - Cardiac monitoring - Cards following, appreciate their input  - Lovenox 60mg  BID  AMS  UTI  Sepsis- Resolved No more tachypnea. Blood cultures with no growth at 12 hours. Urine culture with >100,000 colonies E Coli. No susceptibilities yet. WBC 12.0>>7.7. Consider that normal pressure hydrocephalus could be the cause of his AMS, urinary incontinence, and gait instability. CT Head at last admission with ventriculomegaly, though could be age-related cerebral atrophy.  - Continue CTX for now, will narrow once susceptibilities are back - f/ublood cultures - Brain MRI to evaluate for NPH - Daily labs: CBC, CMP - PT/OT - TOC for dispo  Transaminitis  Patient has a chronically elevated AST in the setting of chronic etOH use. However, AST and ALT acutely elevated today: AST 64>99. ALT 34>53. Likely a side effect of Lovenox started  yesterday. - Continue to monitor. CMP in am.   Hypokalemia  Hypomagnesemia K 3.2>3.4 s/p PO KCl 40 mEq. Most recent mag 1.7 - Will start KCl 40 mEq BID for 3 days - Continue magnesium oxide 400 mg BID - Continue to monitor, CMP in am  Alcohol use disorder CIWAs stable at 3 for disorientation and anxiety. However, disorientation seems to be his baseline.  - continue folate, thiamine - monitor CIWAs for two more days  History of HTN Normotensive, not on any BP meds.  -Continue to monitor blood pressure  Social Issues Patient discharged to hotel with outpt SW follow-up after last admission and was non-adherent to all meds.  - Will require SNF, SW consulted   FEN/GI:dysphagia 3 Prophylaxis:Lovenox 1mg /kg    Status is: Inpatient  Remains inpatient appropriate because:Ongoing diagnostic testing needed not appropriate for outpatient work up, Unsafe d/c plan and Inpatient level of care appropriate due to severity of illness   Dispo:  Patient From: Home  Planned Disposition: To be determined  Expected discharge date: 08/19/2020  Medically stable for discharge: No  Difficult to place patient: Yes, criminal history    Subjective:  Anthony [name] is rambling and tangential today, but at his baseline. He has no complaints and denies any CP, SOB, or palpitations.   Objective: Temp:  [98.3 F (36.8 C)-98.9 F (37.2 C)] 98.6 F (37 C) (01/27 0103) Pulse Rate:  [103-126] 112 (01/27 0300) Resp:  [18-24] 18 (01/27 0103) BP: (98-187)/(64-160) 98/81 (01/27 0300) SpO2:  [66 %-98 %] 95 % (01/27 0103) Weight:  [62.7 kg] 62.7 kg (01/27 0300) Physical Exam: General: Unkempt, very thin 65 year old man  Cardiovascular: Irregularly irregular, no murmurs  Respiratory: Normal WOB on RA, lungs CTAB Abdomen: Soft, non-tender, no ascites or organomegaly Extremities: No LE edema, warm and well-perfused   Laboratory: Recent Labs  Lab 08/13/20 1828 08/14/20 0304 08/15/20 0321  WBC 12.0*  9.6 7.7  HGB 10.9* 10.1* 9.7*  HCT 34.1* 30.2* 28.7*  PLT 299 261 259   Recent Labs  Lab 08/13/20 1828 08/14/20 0304 08/15/20 0321  NA 137 136 136  K 3.2* 3.2* 3.4*  CL 108 108 108  CO2 17* 17* 18*  BUN 34* 30* 18  CREATININE 0.87 0.82 0.64  CALCIUM 9.0 8.6* 8.8*  PROT 6.9 6.0* 6.3*  BILITOT 1.4* 1.2 0.9  ALKPHOS 84 78 90  ALT 30 34 53*  AST 45* 64* 99*  GLUCOSE 116* 105* 91    Imaging/Diagnostic Tests: No new imaging/tests  Pearla Dubonnet, Medical Student 08/15/2020, 6:43 AM   RESIDENT ATTESTATION    I have seen and examined this patient.     I have discussed the findings and exam and agree with the above note, which I have edited appropriately. I helped develop the management plan that is described in the student's note, and I agree with the content.   Doristine Mango, DO PGY-3 Family Medicine Resident

## 2020-08-16 ENCOUNTER — Inpatient Hospital Stay (HOSPITAL_COMMUNITY): Payer: Medicare Other

## 2020-08-16 DIAGNOSIS — N390 Urinary tract infection, site not specified: Secondary | ICD-10-CM | POA: Diagnosis not present

## 2020-08-16 DIAGNOSIS — Z659 Problem related to unspecified psychosocial circumstances: Secondary | ICD-10-CM | POA: Diagnosis not present

## 2020-08-16 DIAGNOSIS — I4891 Unspecified atrial fibrillation: Secondary | ICD-10-CM | POA: Diagnosis not present

## 2020-08-16 LAB — COMPREHENSIVE METABOLIC PANEL
ALT: 59 U/L — ABNORMAL HIGH (ref 0–44)
AST: 90 U/L — ABNORMAL HIGH (ref 15–41)
Albumin: 2.1 g/dL — ABNORMAL LOW (ref 3.5–5.0)
Alkaline Phosphatase: 100 U/L (ref 38–126)
Anion gap: 10 (ref 5–15)
BUN: 12 mg/dL (ref 8–23)
CO2: 18 mmol/L — ABNORMAL LOW (ref 22–32)
Calcium: 8.5 mg/dL — ABNORMAL LOW (ref 8.9–10.3)
Chloride: 109 mmol/L (ref 98–111)
Creatinine, Ser: 0.6 mg/dL — ABNORMAL LOW (ref 0.61–1.24)
GFR, Estimated: >60 mL/min (ref >60)
Glucose, Bld: 92 mg/dL (ref 70–99)
Potassium: 3.7 mmol/L (ref 3.5–5.1)
Sodium: 137 mmol/L (ref 135–145)
Total Bilirubin: 0.3 mg/dL (ref 0.3–1.2)
Total Protein: 6 g/dL — ABNORMAL LOW (ref 6.5–8.1)

## 2020-08-16 LAB — MAGNESIUM: Magnesium: 1.4 mg/dL — ABNORMAL LOW (ref 1.7–2.4)

## 2020-08-16 LAB — CBC
HCT: 27.5 % — ABNORMAL LOW (ref 39.0–52.0)
Hemoglobin: 9.3 g/dL — ABNORMAL LOW (ref 13.0–17.0)
MCH: 35.6 pg — ABNORMAL HIGH (ref 26.0–34.0)
MCHC: 33.8 g/dL (ref 30.0–36.0)
MCV: 105.4 fL — ABNORMAL HIGH (ref 80.0–100.0)
Platelets: 255 10*3/uL (ref 150–400)
RBC: 2.61 MIL/uL — ABNORMAL LOW (ref 4.22–5.81)
RDW: 12.7 % (ref 11.5–15.5)
WBC: 9 10*3/uL (ref 4.0–10.5)
nRBC: 0 % (ref 0.0–0.2)

## 2020-08-16 LAB — URINE CULTURE: Culture: >=100000 — AB

## 2020-08-16 MED ORDER — AMIODARONE HCL 200 MG PO TABS
200.0000 mg | ORAL_TABLET | Freq: Two times a day (BID) | ORAL | Status: AC
  Administered 2020-08-16 – 2020-08-23 (×16): 200 mg via ORAL
  Filled 2020-08-16 (×16): qty 1

## 2020-08-16 MED ORDER — RIVAROXABAN 20 MG PO TABS
20.0000 mg | ORAL_TABLET | Freq: Every day | ORAL | Status: DC
  Administered 2020-08-16 – 2020-10-08 (×54): 20 mg via ORAL
  Filled 2020-08-16 (×55): qty 1

## 2020-08-16 MED ORDER — CEPHALEXIN 250 MG PO CAPS
250.0000 mg | ORAL_CAPSULE | Freq: Four times a day (QID) | ORAL | Status: AC
  Administered 2020-08-16 – 2020-08-21 (×20): 250 mg via ORAL
  Filled 2020-08-16 (×21): qty 1

## 2020-08-16 MED ORDER — SODIUM CHLORIDE 0.9 % IV BOLUS
500.0000 mL | Freq: Once | INTRAVENOUS | Status: AC
  Administered 2020-08-16: 500 mL via INTRAVENOUS

## 2020-08-16 MED ORDER — METOPROLOL SUCCINATE ER 25 MG PO TB24
25.0000 mg | ORAL_TABLET | Freq: Every day | ORAL | Status: DC
  Filled 2020-08-16: qty 1

## 2020-08-16 MED ORDER — GADOBUTROL 1 MMOL/ML IV SOLN
6.5000 mL | Freq: Once | INTRAVENOUS | Status: AC | PRN
  Administered 2020-08-16: 6.5 mL via INTRAVENOUS

## 2020-08-16 NOTE — Progress Notes (Signed)
Physical Therapy Treatment Patient Details Name: Anthony [name] MRN: [id number] DOB: [birth year] Today's Date: 08/16/2020    History of Present Illness Anthony [name] is a 65 y.o. male presenting with urosepsis and A Fib in RVR. 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 Patient found by social work in his motel room naked in urine and feces with mild confusion. Upon EMS arrival, he was noted to be tachycardic with HR 210 with soft BP,.    PT Comments    Pt progressing with mobility. Known to me from recent admission. Expect pt again will improve with mobility fairly quickly. Currently can recommend SNF but expect he will progress more to an ALF level. He has demonstrated he can not care for himself and likely will need long term supervised setting.    Follow Up Recommendations  SNF (likely to progress to ALF level)     Equipment Recommendations  Other (comment) (maybe rollator)    Recommendations for Other Services       Precautions / Restrictions Precautions Precautions: Fall    Mobility  Bed Mobility Overal bed mobility: Needs Assistance Bed Mobility: Supine to Sit     Supine to sit: Min guard     General bed mobility comments: Assist for lines and safety  Transfers Overall transfer level: Needs assistance Equipment used: 4-wheeled walker Transfers: Sit to/from Stand Sit to Stand: Min guard         General transfer comment: Assist for safety and lines  Ambulation/Gait Ambulation/Gait assistance: Min guard Gait Distance (Feet): 180 Feet Assistive device: 4-wheeled walker Gait Pattern/deviations: Wide base of support;Step-through pattern;Decreased stride length Gait velocity: decr Gait velocity interpretation: 1.31 - 2.62 ft/sec, indicative of limited community ambulator General Gait Details: Assist for safety and lines. Initial instruction for how to use  rollator.   Stairs             Wheelchair Mobility    Modified Rankin (Stroke Patients Only)       Balance Overall balance assessment: Needs assistance Sitting-balance support: Feet supported;No upper extremity supported Sitting balance-Leahy Scale: Fair     Standing balance support: Single extremity supported Standing balance-Leahy Scale: Poor Standing balance comment: rollator and min guard for static standing                            Cognition Arousal/Alertness: Awake/alert Behavior During Therapy: WFL for tasks assessed/performed Overall Cognitive Status: No family/caregiver present to determine baseline cognitive functioning Area of Impairment: Problem solving;Awareness;Safety/judgement;Following commands;Memory;Orientation;Attention                 Orientation Level: Time;Situation Current Attention Level: Selective Memory: Decreased recall of precautions;Decreased short-term memory Following Commands: Follows one step commands consistently Safety/Judgement: Decreased awareness of safety;Decreased awareness of deficits Awareness: Intellectual Problem Solving: Requires verbal cues General Comments: Tangential speech      Exercises      General Comments        Pertinent Vitals/Pain Pain Assessment: No/denies pain    Home Living                      Prior Function            PT Goals (current goals can now be found in the care plan section) Acute Rehab PT Goals Patient Stated Goal: to walk PT Goal Formulation: With patient Time  For Goal Achievement: 08/30/20 Potential to Achieve Goals: Good Progress towards PT goals: Progressing toward goals    Frequency    Min 3X/week      PT Plan Discharge plan needs to be updated    Co-evaluation              AM-PAC PT "6 Clicks" Mobility   Outcome Measure  Help needed turning from your back to your side while in a flat bed without using bedrails?: A  [place] Help needed moving from lying on your back to sitting on the side of a flat bed without using bedrails?: A Rask Help needed moving to and from a bed to a chair (including a wheelchair)?: A Heckman Help needed standing up from a chair using your arms (e.g., wheelchair or bedside chair)?: A Reiger Help needed to walk in hospital room?: A Schmuhl Help needed climbing 3-5 steps with a railing? : A Lollis 6 Click Score: 18    End of Session Equipment Utilized During Treatment: Gait belt Activity Tolerance: Patient tolerated treatment well Patient left: with call bell/phone within reach;in chair;with chair alarm set Nurse Communication: Mobility status PT Visit Diagnosis: Unsteadiness on feet (R26.81)     Time: 1771-1657 PT Time Calculation (min) (ACUTE ONLY): 29 min  Charges:  $Gait Training: 23-37 mins                     [place] Pager [phone] Office [place] 08/16/2020, 1:49 PM

## 2020-08-16 NOTE — NC FL2 (Signed)
[place] LEVEL OF CARE SCREENING TOOL     IDENTIFICATION  Patient Name: Anthony [name] Birthdate: 20-Feb-1956 Sex: male Admission Date (Current Location): 08/13/2020  [facility] and [place] Number:  Herbalist and Address:  [facility], [place] [address] [place], [place] [address]      Provider Number: [id number]  Attending Physician Name and Address:  Zenia Resides, MD  Relative Name and Phone Number:  [name] [phone]    Current Level of Care: Hospital Recommended Level of Care: [place] Prior Approval Number:    Date Approved/Denied:   PASRR Number: [id number]  Discharge Plan: SNF    Current Diagnoses: Patient Active Problem List   Diagnosis Date Noted  . Severe sepsis ([place]) 08/14/2020  . Atrial fibrillation with RVR ([place]) 08/13/2020  . Rhabdomyolysis 07/22/2020  . Cerebral ventriculomegaly 07/22/2020  . Hemoglobin decreased 07/22/2020  . History of delirium tremons 07/22/2020  . Hypoalbuminemia due to protein-calorie malnutrition ([place]) 07/22/2020  . Tobacco use disorder 07/22/2020  . Alcohol withdrawal delirium ([place])   . Dehydration   . Alcohol abuse with alcohol-induced mood disorder ([place]) 07/18/2018  . Depression 05/26/2018  . Need for immunization against influenza 04/25/2018  . Alcohol withdrawal ([place]) 09/01/2017  . Actinic keratosis 11/27/2016  . Macrocytic anemia 11/23/2016  . History of pulmonary embolus (PE) 11/02/2016  . Hiatal hernia 09/14/2016  . Skin cancer 08/13/2016  . Poor social situation 06/09/2013  . Tinea versicolor 06/08/2013  . 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 HTN (hypertension) 04/11/2012  . Alcohol use disorder, severe, dependence ([place]) 09/16/2006    Orientation RESPIRATION BLADDER Height & Weight     Self,Time  Normal Incontinent,External catheter (External Urinary Catheter) Weight: 142 lb 10.2 oz (64.7 kg) Height:  5\' 8"  (172.7 cm)  BEHAVIORAL  SYMPTOMS/MOOD NEUROLOGICAL BOWEL NUTRITION STATUS      Continent Diet (See Discharge Summary)  AMBULATORY STATUS COMMUNICATION OF NEEDS Skin   Limited Assist Verbally Skin abrasions,Other (Comment) (Abrasion, arm, leg, Ecchymosis, Hip,Right,PI Coccyx medial stage 1,red)                       Personal Care Assistance Level of Assistance  Bathing,Feeding,Dressing Bathing Assistance: Limited assistance Feeding assistance: Independent (able to feed self, Mechanical Soft) Dressing Assistance: Limited assistance     Functional Limitations Info  Sight,Hearing,Speech Sight Info: Adequate Hearing Info: Adequate Speech Info: Adequate    SPECIAL CARE FACTORS FREQUENCY  PT (By licensed PT),OT (By licensed OT)     PT Frequency: 5x min weekly OT Frequency: 5x min weekly            Contractures Contractures Info: Not present    Additional Factors Info  Code Status,Psychotropic Code Status Info: FULL Allergies Info: No known allergies Psychotropic Info: sertraline (ZOLOFT) tablet 200 mg PO         Current Medications (08/16/2020):  This is the current hospital active medication list Current Facility-Administered Medications  Medication Dose Route Frequency Provider Last Rate Last Admin  . 0.9 %  sodium chloride infusion   Intravenous PRN Zenia Resides, MD   Stopped at 08/16/20 0118  . amiodarone (PACERONE) tablet 200 mg  200 mg Oral BID Reino Bellis B, NP   200 mg at 08/16/20 1042  . cephALEXin (KEFLEX) capsule 250 mg  250 mg Oral Q6H Welborn, Ryan, DO   250 mg at 08/16/20 1210  . ferrous sulfate tablet 325 mg  325 mg  Oral Once per day on Mon Thu Anderson, Hannah C, DO   325 mg at 08/15/20 1038  . folic acid (FOLVITE) tablet 1 mg  1 mg Oral Daily Milus Banister C, DO   1 mg at 08/16/20 0908  . hydroxypropyl methylcellulose / hypromellose (ISOPTO TEARS / GONIOVISC) 2.5 % ophthalmic solution 1 drop  1 drop Both Eyes PRN Milus Banister C, DO      . magnesium oxide  (MAG-OX) tablet 400 mg  400 mg Oral BID Milus Banister C, DO   400 mg at 08/16/20 0908  . [START ON 08/17/2020] metoprolol succinate (TOPROL-XL) 24 hr tablet 25 mg  25 mg Oral Daily Reino Bellis B, NP      . potassium chloride SA (KLOR-CON) CR tablet 40 mEq  40 mEq Oral BID Lurline Del, DO   40 mEq at 08/16/20 0908  . rivaroxaban (XARELTO) tablet 20 mg  20 mg Oral Q supper Zenia Resides, MD      . sertraline (ZOLOFT) tablet 200 mg  200 mg Oral Daily Milus Banister C, DO   200 mg at 08/16/20 0908  . thiamine tablet 100 mg  100 mg Oral Daily Milus Banister C, DO   100 mg at 08/16/20 6712     Discharge Medications: Please see discharge summary for a list of discharge medications.  Relevant Imaging Results:  Relevant Lab Results:   Additional Information [phone]  Trula Ore, LCSWA

## 2020-08-16 NOTE — Progress Notes (Addendum)
Family Medicine Teaching Service Daily Progress Note Intern Pager: [phone]  Patient name: Anthony [name] Medical record number: [id number] Date of birth: 1956/05/26 Age: 65 y.o. Gender: male  Primary Care Provider: Carollee Leitz, MD Consultants: Cardiology  Code Status: Full  Pt Overview and Major Events to Date:  1/25- admitted   Assessment and Plan: Anthony A Littleis a 65 y.o.malepresenting with urosepsis and A Fib in RVR due to medication non-adherence. 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  A Fib with RVR-resolved  HFpEF Heart rate well controlled in the 70s and currently regular.  Has remained asymptomatic. BP soft with MAPs in the 70s.   - Reduce metoprolol succinate to 25mg   - Amiodarone PO 200mg  BID per cardiology recommendation - 562mL NS bolus  -Cardiac monitoring - Cards following, appreciate their input  - Lovenox 60mg  BID  UTI  Sepsis- Resolved No more tachypnea. Blood cultures with no growth at 48 hours. Urine culture with >100,000 colonies Ampicillin resistant E Coli. -Narrow Ceftriaxone to Keflex  AMS  Patient continues mentating at his baseline from this admission and most recent admission. MRI ordered yesterday not yet done.  - MRI pending -Dailylabs: CBC, CMP - PT/OT - TOC for dispo  Transaminitis  AST/ALT remain mildly elevated; stable from yesterday. Likely a side effect of Lovenox started yesterday. - Continue to monitor. CMP in am.   Hypokalemia  Hypomagnesemia K 3.2>>3.7. Mag 1.4. - KCl 40 mEq BID day 2/3 -Continue magnesium oxide 400 mg BID - Continue to monitor, CMP in am  Alcohol use disorder CIWAs stable at 4 for disorientation. However, disorientation seems to be his baseline.  - continue folate, thiamine - monitor CIWAs for one more day  History of HTN Normotensive, not on any BP meds. -Continue to monitor blood  pressure  Social Issues Patient discharged to hotel with outpt SW follow-up after last admission and was non-adherent to all meds.  - Will require SNF, SW consulted   FEN/GI:dysphagia 3 Prophylaxis:Lovenox 1mg /kg   Status is: Inpatient  Remains inpatient appropriate because:Pending SNF Placement   Dispo:  Patient From: Home  Planned Disposition: Advance  Expected discharge date: 08/19/2020  Medically stable for discharge: No  Difficult to place patient: Yes  Subjective:  Anthony [name] is conversing at his baseline. States he is enjoying breakfast. He has no complaints today, feels like himself.   Objective: Temp:  [97.9 F (36.6 C)-98.7 F (37.1 C)] 98 F (36.7 C) (01/28 0540) Pulse Rate:  [77-100] 77 (01/28 0540) Resp:  [19-21] 21 (01/28 0540) BP: (93-114)/(62-92) 107/74 (01/28 0540) SpO2:  [94 %-100 %] 100 % (01/28 0540) Weight:  [64.7 kg] 64.7 kg (01/28 0540) Physical Exam: General: Unkempt, very thin 65 yo man Cardiovascular: RRR, no m/r/g Respiratory: Lungs are clear bilaterally, normal WOB on RA Abdomen: Soft, non-tender, non-distended, no ascites or organomegaly Extremities: No LE edema, no tremor Neuro: A&Ox3  Laboratory: Recent Labs  Lab 08/14/20 0304 08/15/20 0321 08/16/20 0328  WBC 9.6 7.7 9.0  HGB 10.1* 9.7* 9.3*  HCT 30.2* 28.7* 27.5*  PLT 261 259 255   Recent Labs  Lab 08/14/20 0304 08/15/20 0321 08/16/20 0328  NA 136 136 137  K 3.2* 3.4* 3.7  CL 108 108 109  CO2 17* 18* 18*  BUN 30* 18 12  CREATININE 0.82 0.64 0.60*  CALCIUM 8.6* 8.8* 8.5*  PROT 6.0* 6.3* 6.0*  BILITOT 1.2 0.9 0.3  ALKPHOS 78 90 100  ALT 34 53* 59*  AST 64* 99* 90*  GLUCOSE 105* 91 92   Imaging/Diagnostic Tests: No new imaging/tests  Anthony [name], Medical Student 08/16/2020, 6:40 AM  Resident attestation: I agree with the documentation of Student Anthony [name] above. I have made adjustments to his note as appropriate. I have seen the  patient and performed physical exam on the patient consistent with his documented physical exam above.  Anthony Del, DO

## 2020-08-16 NOTE — Plan of Care (Signed)

## 2020-08-16 NOTE — TOC Initial Note (Signed)
Transition of Care [facility]) - Initial/Assessment Note    Patient Details  Name: Anthony [name] MRN: [id number] Date of Birth: 01-03-1956  Transition of Care [facility]) CM/SW Contact:    Trula Ore, [place] Phone Number: 08/16/2020, 2:41 PM  Clinical Narrative:          CSW received consult for possible SNF placement at time of discharge. CSW spoke with patients Niece [name] regarding PT recommendation of SNF placement at time of discharge. Patients niece reported that patient comes from motel. Patients Niece expressed understanding of PT recommendation and is agreeable to SNF placement at time of discharge. Patient niece gave CSW permission to fax out near [place] area. CSW discussed insurance authorization process.Patients niece unsure if patient has received the COVID vaccines.No further questions reported at this time. CSW to continue to follow and assist with discharge planning needs.           Expected Discharge Plan: Skilled Nursing Facility Barriers to Discharge: Continued Medical Work up   Patient Goals and CMS Choice   CMS Medicare.gov Compare Post Acute Care list provided to:: Patient Represenative (must comment) (Niece [name]) Choice offered to / list presented to : NA (Niece [place])  Expected Discharge Plan and Services Expected Discharge Plan: [place]       Living arrangements for the past 2 months: Hotel/Motel                                      Prior Living Arrangements/Services Living arrangements for the past 2 months: Hotel/Motel Lives with:: Self Patient language and need for interpreter reviewed:: Yes Do you feel safe going back to the place where you live?: No   SNF  Need for Family Participation in Patient Care: Yes (Comment) Care giver support system in place?: Yes (comment)   Criminal Activity/Legal Involvement Pertinent to Current Situation/Hospitalization: No - Comment as needed  Activities of Daily Living Home Assistive  Devices/Equipment: None ADL Screening (condition at time of admission) Patient's cognitive ability adequate to safely complete daily activities?: Yes Is the patient deaf or have difficulty hearing?: No Does the patient have difficulty seeing, even when wearing glasses/contacts?: No Does the patient have difficulty concentrating, remembering, or making decisions?: Yes Patient able to express need for assistance with ADLs?: Yes Does the patient have difficulty dressing or bathing?: Yes Independently performs ADLs?: No Communication: Needs assistance (Confused) Is this a change from baseline?:  (Unsure) Dressing (OT): Independent Grooming: Needs assistance Feeding: Independent Bathing: Independent Toileting: Independent In/Out Bed: Needs assistance Is this a change from baseline?:  (Unsure) Walks in Home: Independent Does the patient have difficulty walking or climbing stairs?: No Weakness of Legs: None Weakness of Arms/Hands: None  Permission Sought/Granted Permission sought to share information with : Case Manager,Family Chief Financial Officer Permission granted to share information with : Yes, Verbal Permission Granted  Share Information with NAME: [name]  Permission granted to share info w AGENCY: SNF  Permission granted to share info w Relationship: Niece  Permission granted to share info w Contact Information: [name] [phone]  Emotional Assessment       Orientation: : Oriented to Self,Oriented to  Time Alcohol / Substance Use: Not Applicable Psych Involvement: No (comment)  Admission diagnosis:  Atrial fibrillation with RVR ([place]) [I48.91] Urinary tract infection without hematuria, site unspecified [N39.0] Patient Active Problem List   Diagnosis Date Noted  . Severe sepsis ([place]) 08/14/2020  .  Atrial fibrillation with RVR ([place]) 08/13/2020  . Rhabdomyolysis 07/22/2020  . Cerebral ventriculomegaly 07/22/2020  . Hemoglobin decreased 07/22/2020  .  History of delirium tremons 07/22/2020  . Hypoalbuminemia due to protein-calorie malnutrition ([place]) 07/22/2020  . Tobacco use disorder 07/22/2020  . Alcohol withdrawal delirium ([place])   . Dehydration   . Alcohol abuse with alcohol-induced mood disorder ([place]) 07/18/2018  . Depression 05/26/2018  . Need for immunization against influenza 04/25/2018  . Alcohol withdrawal ([place]) 09/01/2017  . Actinic keratosis 11/27/2016  . Macrocytic anemia 11/23/2016  . History of pulmonary embolus (PE) 11/02/2016  . Hiatal hernia 09/14/2016  . Skin cancer 08/13/2016  . Poor social situation 06/09/2013  . Tinea versicolor 06/08/2013  . 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 HTN (hypertension) 04/11/2012  . Alcohol use disorder, severe, dependence ([place]) 09/16/2006   PCP:  Carollee Leitz, MD Pharmacy:   [facility] [place], [place] - [address] AT [place] [place] [address] Phone: [phone] Fax: [phone]  [facility] [place], [place] - [address] [place] [address] Phone: [phone] Fax: [phone]     Social Determinants of Health (SDOH) Interventions    Readmission Risk Interventions No flowsheet data found.

## 2020-08-16 NOTE — Progress Notes (Addendum)
Progress Note  Patient Name: Anthony [name] Date of Encounter: 08/16/2020  [facility] Cardiologist: Lauree Chandler, MD   Subjective   Much more alert today. Able to state his name, and knows that he's in [place].   Inpatient Medications    Scheduled Meds: . cephALEXin  250 mg Oral Q6H  . enoxaparin (LOVENOX) injection  60 mg Subcutaneous Q12H  . ferrous sulfate  325 mg Oral Once per day on Mon Thu  . folic acid  1 mg Oral Daily  . magnesium oxide  400 mg Oral BID  . metoprolol succinate  50 mg Oral Daily  . potassium chloride  40 mEq Oral BID  . sertraline  200 mg Oral Daily  . thiamine  100 mg Oral Daily   Continuous Infusions: . sodium chloride Stopped (08/16/20 0118)  . amiodarone 30 mg/hr (08/16/20 0834)   PRN Meds: sodium chloride, hydroxypropyl methylcellulose / hypromellose   Vital Signs    Vitals:   08/16/20 0009 08/16/20 0325 08/16/20 0540 08/16/20 0730  BP: 103/79 110/82 107/74 98/70  Pulse: 86 81 77 75  Resp: 20 19 (!) 21 20  Temp: 98.3 F (36.8 C) 97.9 F (36.6 C) 98 F (36.7 C) 98.5 F (36.9 C)  TempSrc: Oral Oral Oral Oral  SpO2: 100% 100% 100% 100%  Weight:   64.7 kg   Height:   5' 8" (1.727 m)     Intake/Output Summary (Last 24 hours) at 08/16/2020 1004 Last data filed at 08/16/2020 0914 Gross per 24 hour  Intake 1427.64 ml  Output 700 ml  Net 727.64 ml   Last 3 Weights 08/16/2020 08/15/2020 08/09/2020  Weight (lbs) 142 lb 10.2 oz 138 lb 3.2 oz 138 lb 9.6 oz  Weight (kg) 64.7 kg 62.687 kg 62.869 kg      Telemetry    SR - Personally Reviewed  ECG    No new tracing.   Physical Exam  Thin, frail older male GEN: No acute distress.   Neck: No JVD Cardiac: RRR, no murmurs, rubs, or gallops.  Respiratory: Clear to auscultation bilaterally. GI: Soft, nontender, non-distended  MS: No edema; No deformity. Neuro:  Nonfocal  Psych: Normal affect   Labs    High Sensitivity Troponin:  No results for input(s): TROPONINIHS in  the last 720 hours.    Chemistry Recent Labs  Lab 08/14/20 0304 08/15/20 0321 08/16/20 0328  NA 136 136 137  K 3.2* 3.4* 3.7  CL 108 108 109  CO2 17* 18* 18*  GLUCOSE 105* 91 92  BUN 30* 18 12  CREATININE 0.82 0.64 0.60*  CALCIUM 8.6* 8.8* 8.5*  PROT 6.0* 6.3* 6.0*  ALBUMIN 2.2* 2.2* 2.1*  AST 64* 99* 90*  ALT 34 53* 59*  ALKPHOS 78 90 100  BILITOT 1.2 0.9 0.3  GFRNONAA >60 >60 >60  ANIONGAP _0 Hematology Recent Labs  Lab 08/14/20 0304 08/15/20 0321 08/16/20 0328  WBC 9.6 7.7 9.0  RBC 2.83* 2.73* 2.61*  HGB 10.1* 9.7* 9.3*  HCT 30.2* 28.7* 27.5*  MCV 106.7* 105.1* 105.4*  MCH 35.7* 35.5* 35.6*  MCHC 33.4 33.8 33.8  RDW 12.5 12.7 12.7  PLT 261 259 255    BNPNo results for input(s): BNP, PROBNP in the last 168 hours.   DDimer No results for input(s): DDIMER in the last 168 hours.   Radiology    No results found.  Cardiac Studies   N/a   Patient Profile  65 y.o. male  with a hx of Afib, HTN, ETOH abuse, Diastolic HF, chronic back pain, depression and unprovoked PEwho was seen for the evaluation of Afib.  Assessment & Plan    1. Atrialfibrillation with RVR:This was diagnosed during his previous admission. He was discharged on Toprol 12.5 mg daily along with Xarelto 20 mg daily.He did not take any of his medications after discharge. Heart rates initially in the 140-150 range. Placed on amiodarone gtt and converted to SR yesterday morning. Has maintained SR.  -- transitioned to amiodarone 200mg BID. Blood pressures remain soft, would reduce Toprol back to 25mg daily.  -- He has not been consuming meals therefore his Xarelto was transitioned to Lovenox. We will continue with rate controlling strategy for now. Would not consider a good candidate for cardioversion.  --This patients CHA2DS2-VASc Scoreof 2. Does have hx of unprovoked PE as well. Given his hx of ETOH use, altered mental status and social issues, not sure that he would be a good  long term anticoagulation candidate unless he discharged to a controlled environment like SNF.  2. AMS with sepsis/UTI: met sepsis criteria on admission. Treated with antibiotics per primary. BC neg to date. Lactic improved. Blood pressures are soft given IVFs yesterday. He is much more alert today and drinking fluids.   3. ETOH use:On CIWA protocol   4. Hypokalemia/hypomag: Improved to 3.7  5. Hx of PE:unprovoked per chart review from 2018. Had been on Xarelto.   6. Social issues:had been discharged to motel after last admission. Noted that he was unable to appropriately care for himself during the brief time he was discharged. SNF recommended during last admission but he denied.    For questions or updates, please contact [facility] Please consult [email or web address] for contact info under        Signed, Lindsay Roberts, NP  08/16/2020, 10:04 AM    I have personally seen and examined this patient. I agree with the assessment and plan as outlined above.  He is in sinus today. Continue amiodarone and Toprol. I would anticipate leaving him on amiodarone for now. I would not restart a DOAC given his poor compliance and poor social situation.     08/16/2020 10:57 AM   

## 2020-08-17 DIAGNOSIS — I4891 Unspecified atrial fibrillation: Secondary | ICD-10-CM | POA: Diagnosis not present

## 2020-08-17 DIAGNOSIS — A419 Sepsis, unspecified organism: Secondary | ICD-10-CM | POA: Diagnosis not present

## 2020-08-17 DIAGNOSIS — Z659 Problem related to unspecified psychosocial circumstances: Secondary | ICD-10-CM | POA: Diagnosis not present

## 2020-08-17 DIAGNOSIS — N390 Urinary tract infection, site not specified: Secondary | ICD-10-CM | POA: Diagnosis not present

## 2020-08-17 LAB — COMPREHENSIVE METABOLIC PANEL
ALT: 47 U/L — ABNORMAL HIGH (ref 0–44)
AST: 49 U/L — ABNORMAL HIGH (ref 15–41)
Albumin: 2 g/dL — ABNORMAL LOW (ref 3.5–5.0)
Alkaline Phosphatase: 93 U/L (ref 38–126)
Anion gap: 7 (ref 5–15)
BUN: 10 mg/dL (ref 8–23)
CO2: 19 mmol/L — ABNORMAL LOW (ref 22–32)
Calcium: 8.6 mg/dL — ABNORMAL LOW (ref 8.9–10.3)
Chloride: 110 mmol/L (ref 98–111)
Creatinine, Ser: 0.53 mg/dL — ABNORMAL LOW (ref 0.61–1.24)
GFR, Estimated: >60 mL/min (ref >60)
Glucose, Bld: 87 mg/dL (ref 70–99)
Potassium: 4.6 mmol/L (ref 3.5–5.1)
Sodium: 136 mmol/L (ref 135–145)
Total Bilirubin: 0.6 mg/dL (ref 0.3–1.2)
Total Protein: 5.6 g/dL — ABNORMAL LOW (ref 6.5–8.1)

## 2020-08-17 LAB — GLUCOSE, CAPILLARY: Glucose-Capillary: 82 mg/dL (ref 70–99)

## 2020-08-17 NOTE — Progress Notes (Signed)
Family Medicine Teaching Service Daily Progress Note Intern Pager: [phone]  Patient name: Anthony [name] [place] Medical record number: [id number] Date of birth: [birth year] Age: 65 y.o. Gender: male  Primary Care Provider: Carollee Leitz, MD Consultants: Cardiology   Code Status: Full Code  Pt Overview and Major Events to Date:  1/25 admitted  Assessment and Plan: Anthony [name] is a 65 y.o. male who presents with urosepsis and A Fib in RVRdue to medication non-adherence. 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 Medically stable for discharge to SNF.  A Fib Previously in RVR, now resolved. In A Fib, rate well controlled. Yesterday switched from [place] to [place]. - cardiology following, appreciate involvement - metoprolol succinate 25 mg - PO amiodarone 200 mg BID - rivaroxaban 20 mg  UTI No longer septic. Ampicillin resistant E. Coli. Antibiotics narrowed yesterday. - cephalexin x 5 days  Encephalopathy Resolved, at baseline. MRI unremarkable, no acute findings and no evidence of NPH.  Transaminitis Improving. Possibly secondary to LMWH.  Hypomagnesemia Mg 1.4 1/28. - magnesium oxide 400 mg BID  Alcohol use disorder CIWAs have been stable. - folate, thiamine - d/c CIWA monitoring  History of HTN BP soft to normotensive. - monitor  FEN/GI: DYS 3 PPx: rivaroxaban  Disposition: SNF vs ALF, medically stable for discharge  Subjective:  NAOE. No concerns this AM. Feeling sleepy.  Objective: Temp:  [97.8 F (36.6 C)-98.5 F (36.9 C)] 98.2 F (36.8 C) (01/29 0515) Pulse Rate:  [73-89] 79 (01/29 0515) Resp:  [16-20] 18 (01/29 0515) BP: (97-112)/(66-73) 112/68 (01/29 0515) SpO2:  [96 %-100 %] 100 % (01/29 0515) Weight:  [70 kg] 70 kg (01/29 0515) Physical Exam: General: unkempt male, NAD Cardiovascular: RRR, no murmurs Respiratory: CTAB, breathing comfortably on room air Abdomen: soft,  non-tender, +BS Extremities: WWP, no edema Neuro: A&Ox3  Laboratory: Recent Labs  Lab 08/14/20 0304 08/15/20 0321 08/16/20 0328  WBC 9.6 7.7 9.0  HGB 10.1* 9.7* 9.3*  HCT 30.2* 28.7* 27.5*  PLT 261 259 255   Recent Labs  Lab 08/15/20 0321 08/16/20 0328 08/17/20 0323  NA 136 137 136  K 3.4* 3.7 4.6  CL 108 109 110  CO2 18* 18* 19*  BUN 18 12 10   CREATININE 0.64 0.60* 0.53*  CALCIUM 8.8* 8.5* 8.6*  PROT 6.3* 6.0* 5.6*  BILITOT 0.9 0.3 0.6  ALKPHOS 90 100 93  ALT 53* 59* 47*  AST 99* 90* 49*  GLUCOSE 91 92 87    Imaging/Diagnostic Tests: MR BRAIN W WO CONTRAST  Result Date: 08/16/2020 CLINICAL DATA:  Encephalopathy EXAM: MRI HEAD WITHOUT AND WITH CONTRAST TECHNIQUE: Multiplanar, multiecho pulse sequences of the brain and surrounding structures were obtained without and with intravenous contrast. CONTRAST:  6.28mL GADAVIST GADOBUTROL 1 MMOL/ML IV SOLN COMPARISON:  None. FINDINGS: Brain: No acute infarct, mass effect or extra-axial collection. No acute or chronic hemorrhage. There is multifocal hyperintense T2-weighted signal within the white matter. Generalized volume loss without a clear lobar predilection. The midline structures are normal. There is no abnormal contrast enhancement. Vascular: Major flow voids are preserved. Skull and upper cervical spine: Normal calvarium and skull base. Visualized upper cervical spine and soft tissues are normal. Sinuses/Orbits:No paranasal sinus fluid levels or advanced mucosal thickening. No mastoid or middle ear effusion. Normal orbits. IMPRESSION: 1. No acute intracranial abnormality. 2. Findings of mild chronic microvascular disease and generalized volume loss. 3. No evidence of normal pressure hydrocephalus.  Electronically Signed   By: Ulyses Jarred M.D.   On: 08/16/2020 19:16     Zola Button, MD 08/17/2020, 6:50 AM PGY-1, [place] Intern pager: [phone], text pages welcome

## 2020-08-17 NOTE — Progress Notes (Signed)
Occupational Therapy Treatment Patient Details Name: Anthony [name] MRN: [id number] DOB: Apr 26, 1956 Today's Date: 08/17/2020    History of present illness Anthony [name] is a 65 y.o. male presenting with urosepsis and A Fib in RVR. 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 Patient found by social work in his motel room naked in urine and feces with mild confusion. Upon EMS arrival, he was noted to be tachycardic with HR 210 with soft BP,.   OT comments  Patient making incremental progress towards stated goals.  He was Mod I for bed mobility and generally supervision for in room mobility/toileting, with closer to [facility] when turning.  Deficits remain as described below.  SNF has been recommended.  OT will continue in the acute setting to maximize his functional status.    Follow Up Recommendations  SNF    Equipment Recommendations  None recommended by OT    Recommendations for Other Services      Precautions / Restrictions Precautions Precautions: Fall       Mobility Bed Mobility Overal bed mobility: Needs Assistance Bed Mobility: Supine to Sit;Sit to Supine Rolling: Modified independent (Device/Increase time)   Supine to sit: Modified independent (Device/Increase time)        Transfers Overall transfer level: Needs assistance   Transfers: Sit to/from Stand Sit to Stand: Min guard         General transfer comment: Assist for safety and lines    Balance Overall balance assessment: Needs assistance Sitting-balance support: Feet supported;No upper extremity supported Sitting balance-Leahy Scale: Good     Standing balance support: Bilateral upper extremity supported Standing balance-Leahy Scale: Poor                             ADL either performed or assessed with clinical judgement   ADL Overall ADL's : Needs assistance/impaired     Grooming:  Supervision/safety;Standing           Upper Body Dressing : Set up;Sitting   Lower Body Dressing: Sitting/lateral leans;Sit to/from stand;Min guard   Toilet Transfer: Min guard;RW   Toileting- Water quality scientist and Hygiene: Min guard;Sit to/from stand Toileting - Clothing Manipulation Details (indicate cue type and reason): patient urinated- cath was out.     Functional mobility during ADLs: Min guard;Rolling walker                         Cognition Arousal/Alertness: Awake/alert Behavior During Therapy: WFL for tasks assessed/performed                       Current Attention Level: Selective Memory: Decreased recall of precautions;Decreased short-term memory Following Commands: Follows one step commands consistently     Problem Solving: Requires verbal cues General Comments: loses his train of though easily        Exercises     Shoulder Instructions       General Comments  VSS on RA    Pertinent Vitals/ Pain  Frequency  Min 2X/week        Progress Toward Goals  OT Goals(current goals can now be found in the care plan section)  Progress towards OT goals: Progressing toward goals  Acute Rehab OT Goals Patient Stated Goal: I hope I'm getting better OT Goal Formulation: With patient Time For Goal Achievement: 08/21/20 Potential to Achieve Goals: [place] Discharge plan remains appropriate    Co-evaluation                 AM-PAC OT "6 Clicks" Daily Activity     Outcome Measure   Help from another person eating meals?: A [place] Help from another person taking care of personal grooming?: A [place] Help from another person toileting, which includes using toliet, bedpan, or urinal?: A [place] Help from another person bathing (including washing, rinsing, drying)?: A Lot Help from another person to put on and taking off regular upper  body clothing?: A [place] Help from another person to put on and taking off regular lower body clothing?: A Lot 6 Click Score: 16    End of Session Equipment Utilized During Treatment: Gait belt;Rolling walker  OT Visit Diagnosis: Unsteadiness on feet (R26.81);Other symptoms and signs involving cognitive function   Activity Tolerance Patient tolerated treatment well   Patient Left in bed;with call bell/phone within reach;with bed alarm set   Nurse Communication Mobility status        Time: 1631-1650 OT Time Calculation (min): 19 min  Charges: OT General Charges $OT Visit: 1 Visit OT Treatments $Self Care/Home Management : 8-22 mins  08/17/2020  Rich, OTR/L  Acute Rehabilitation Services  Office:  [phone]    Metta Clines 08/17/2020, 4:57 PM

## 2020-08-17 NOTE — TOC Progression Note (Signed)
Transition of Care [facility]) - Progression Note    Patient Details  Name: Anthony [name] MRN: [id number] Date of Birth: 05/18/1956  Transition [facility]) CM/SW [place], [place] Phone Number: [phone] 08/17/2020, 11:08 AM  Clinical Narrative:     Patient currently does not have any bed offers.  TOC team will continue to assist with discharge planning needs.  Expected Discharge Plan: [place] Barriers to Discharge: Continued Medical Work up  Expected Discharge Plan and Services Expected Discharge Plan: [place] arrangements for the past 2 months: Hotel/Motel                                       Social Determinants of Health (SDOH) Interventions    Readmission Risk Interventions No flowsheet data found.

## 2020-08-17 NOTE — Progress Notes (Signed)
Progress Note  Patient Name: Anthony [name] Date of Encounter: 08/17/2020  [facility] Cardiologist: Anthony Chandler, MD   Subjective   Alert and awake.  Feeling well.  Inpatient Medications    Scheduled Meds: . amiodarone  200 mg Oral BID  . cephALEXin  250 mg Oral Q6H  . ferrous sulfate  325 mg Oral Once per day on Mon Thu  . folic acid  1 mg Oral Daily  . magnesium oxide  400 mg Oral BID  . metoprolol succinate  25 mg Oral Daily  . rivaroxaban  20 mg Oral Q supper  . sertraline  200 mg Oral Daily  . thiamine  100 mg Oral Daily   Continuous Infusions: . sodium chloride Stopped (08/16/20 0118)   PRN Meds: sodium chloride, hydroxypropyl methylcellulose / hypromellose   Vital Signs    Vitals:   08/16/20 1302 08/16/20 1710 08/16/20 1941 08/17/20 0515  BP: 100/66 99/73 109/71 112/68  Pulse: 87  89 79  Resp: 19 18 16 18   Temp: 98.5 F (36.9 C) 98.1 F (36.7 C) 97.8 F (36.6 C) 98.2 F (36.8 C)  TempSrc: Oral Oral Oral Oral  SpO2: 100%  100% 100%  Weight:    70 kg  Height:        Intake/Output Summary (Last 24 hours) at 08/17/2020 0817 Last data filed at 08/17/2020 9983 Gross per 24 hour  Intake 387.83 ml  Output 1600 ml  Net -1212.17 ml   Last 3 Weights 08/17/2020 08/16/2020 08/15/2020  Weight (lbs) 154 lb 5.2 oz 142 lb 10.2 oz 138 lb 3.2 oz  Weight (kg) 70 kg 64.7 kg 62.687 kg      Telemetry    Sinus rhythm- Personally Reviewed  ECG    None  Physical Exam   GEN: Well nourished, well developed, in no acute distress  HEENT: normal  Neck: no JVD, carotid bruits, or masses Cardiac: RRR; no murmurs, rubs, or gallops,no edema  Respiratory:  clear to auscultation bilaterally, normal work of breathing GI: soft, nontender, nondistended, + BS MS: no deformity or atrophy  Skin: warm and dry Neuro:  Strength and sensation are intact Psych: euthymic mood, full affect   Labs    High Sensitivity Troponin:  No results for input(s): TROPONINIHS in  the last 720 hours.    Chemistry Recent Labs  Lab 08/15/20 0321 08/16/20 0328 08/17/20 0323  NA 136 137 136  K 3.4* 3.7 4.6  CL 108 109 110  CO2 18* 18* 19*  GLUCOSE 91 92 87  BUN 18 12 10   CREATININE 0.64 0.60* 0.53*  CALCIUM 8.8* 8.5* 8.6*  PROT 6.3* 6.0* 5.6*  ALBUMIN 2.2* 2.1* 2.0*  AST 99* 90* 49*  ALT 53* 59* 47*  ALKPHOS 90 100 93  BILITOT 0.9 0.3 0.6  GFRNONAA >60 >60 >60  ANIONGAP 10 10 7      Hematology Recent Labs  Lab 08/14/20 0304 08/15/20 0321 08/16/20 0328  WBC 9.6 7.7 9.0  RBC 2.83* 2.73* 2.61*  HGB 10.1* 9.7* 9.3*  HCT 30.2* 28.7* 27.5*  MCV 106.7* 105.1* 105.4*  MCH 35.7* 35.5* 35.6*  MCHC 33.4 33.8 33.8  RDW 12.5 12.7 12.7  PLT 261 259 255    BNPNo results for input(s): BNP, PROBNP in the last 168 hours.   DDimer No results for input(s): DDIMER in the last 168 hours.   Radiology    MR BRAIN W WO CONTRAST  Result Date: 08/16/2020 CLINICAL DATA:  Encephalopathy EXAM: MRI HEAD WITHOUT  AND WITH CONTRAST TECHNIQUE: Multiplanar, multiecho pulse sequences of the brain and surrounding structures were obtained without and with intravenous contrast. CONTRAST:  6.65mL GADAVIST GADOBUTROL 1 MMOL/ML IV SOLN COMPARISON:  None. FINDINGS: Brain: No acute infarct, mass effect or extra-axial collection. No acute or chronic hemorrhage. There is multifocal hyperintense T2-weighted signal within the white matter. Generalized volume loss without a clear lobar predilection. The midline structures are normal. There is no abnormal contrast enhancement. Vascular: Major flow voids are preserved. Skull and upper cervical spine: Normal calvarium and skull base. Visualized upper cervical spine and soft tissues are normal. Sinuses/Orbits:No paranasal sinus fluid levels or advanced mucosal thickening. No mastoid or middle ear effusion. Normal orbits. IMPRESSION: 1. No acute intracranial abnormality. 2. Findings of mild chronic microvascular disease and generalized volume loss. 3.  No evidence of normal pressure hydrocephalus. Electronically Signed   By: Anthony [name] M.D.   On: 08/16/2020 19:16    Cardiac Studies   N/a   Patient Profile     65 y.o. male  with a hx of Afib, HTN, ETOH abuse, Diastolic HF, chronic back pain, depression and unprovoked PEwho was seen for the evaluation of Afib.  Assessment & Plan    1.  Atrial fibrillation with rapid ventricular response.  Was diagnosed during a previous admission.  Was on metoprolol and Xarelto.  Heart rates were initially 140s to 150s and he was placed on amiodarone drip.  He did convert to sinus rhythm.  At this point, we Anthony [name] continue amiodarone 200 mg twice daily.  Also continue current dose of metoprolol.  CHA2DS2-VASc of 2.  It has been determined that he is not a good anticoagulation candidate so we Anthony [name] hold off on oral anticoagulation for now.  2.  Altered mental status with sepsis and UTI: Has been on antibiotics per primary team.  3.  Alcohol abuse: Currently on CIWA protocol.  Cardiology to see as needed through the weekend.  Feel free to call with any questions.   For questions or updates, please contact Anthony [name] Please consult [email or web address] for contact info under        Signed, Anthony Grimmett Meredith Leeds, MD  08/17/2020, 8:17 AM

## 2020-08-18 DIAGNOSIS — E46 Unspecified protein-calorie malnutrition: Secondary | ICD-10-CM

## 2020-08-18 DIAGNOSIS — E8809 Other disorders of plasma-protein metabolism, not elsewhere classified: Secondary | ICD-10-CM

## 2020-08-18 DIAGNOSIS — I4891 Unspecified atrial fibrillation: Secondary | ICD-10-CM | POA: Diagnosis not present

## 2020-08-18 DIAGNOSIS — L8990 Pressure ulcer of unspecified site, unspecified stage: Secondary | ICD-10-CM | POA: Insufficient documentation

## 2020-08-18 DIAGNOSIS — Z659 Problem related to unspecified psychosocial circumstances: Secondary | ICD-10-CM | POA: Diagnosis not present

## 2020-08-18 LAB — CBC
HCT: 28.4 % — ABNORMAL LOW (ref 39.0–52.0)
Hemoglobin: 9.3 g/dL — ABNORMAL LOW (ref 13.0–17.0)
MCH: 34.7 pg — ABNORMAL HIGH (ref 26.0–34.0)
MCHC: 32.7 g/dL (ref 30.0–36.0)
MCV: 106 fL — ABNORMAL HIGH (ref 80.0–100.0)
Platelets: 288 10*3/uL (ref 150–400)
RBC: 2.68 MIL/uL — ABNORMAL LOW (ref 4.22–5.81)
RDW: 12.6 % (ref 11.5–15.5)
WBC: 7.4 10*3/uL (ref 4.0–10.5)
nRBC: 0 % (ref 0.0–0.2)

## 2020-08-18 LAB — COMPREHENSIVE METABOLIC PANEL
ALT: 41 U/L (ref 0–44)
AST: 42 U/L — ABNORMAL HIGH (ref 15–41)
Albumin: 2 g/dL — ABNORMAL LOW (ref 3.5–5.0)
Alkaline Phosphatase: 93 U/L (ref 38–126)
Anion gap: 7 (ref 5–15)
BUN: 8 mg/dL (ref 8–23)
CO2: 23 mmol/L (ref 22–32)
Calcium: 8.5 mg/dL — ABNORMAL LOW (ref 8.9–10.3)
Chloride: 105 mmol/L (ref 98–111)
Creatinine, Ser: 0.55 mg/dL — ABNORMAL LOW (ref 0.61–1.24)
GFR, Estimated: >60 mL/min (ref >60)
Glucose, Bld: 90 mg/dL (ref 70–99)
Potassium: 3.9 mmol/L (ref 3.5–5.1)
Sodium: 135 mmol/L (ref 135–145)
Total Bilirubin: 0.5 mg/dL (ref 0.3–1.2)
Total Protein: 5.6 g/dL — ABNORMAL LOW (ref 6.5–8.1)

## 2020-08-18 LAB — CULTURE, BLOOD (ROUTINE X 2)
Culture: NO GROWTH
Culture: NO GROWTH

## 2020-08-18 LAB — MAGNESIUM: Magnesium: 1.4 mg/dL — ABNORMAL LOW (ref 1.7–2.4)

## 2020-08-18 MED ORDER — METOPROLOL SUCCINATE ER 25 MG PO TB24
25.0000 mg | ORAL_TABLET | Freq: Every day | ORAL | Status: DC
  Administered 2020-08-18 – 2020-10-09 (×52): 25 mg via ORAL
  Filled 2020-08-18 (×54): qty 1

## 2020-08-18 MED ORDER — HYDROXYZINE HCL 10 MG PO TABS
10.0000 mg | ORAL_TABLET | Freq: Once | ORAL | Status: AC
  Administered 2020-08-18: 10 mg via ORAL
  Filled 2020-08-18: qty 1

## 2020-08-18 MED ORDER — MAGNESIUM SULFATE 2 GM/50ML IV SOLN
2.0000 g | Freq: Once | INTRAVENOUS | Status: AC
  Administered 2020-08-18: 2 g via INTRAVENOUS
  Filled 2020-08-18: qty 50

## 2020-08-18 NOTE — TOC Progression Note (Signed)
Transition of Care [facility]) - Progression Note    Patient Details  Name: Anthony [name] MRN: [id number] Date of Birth: 1956/02/04  Transition of Care [facility]) CM/SW [place], [place] Phone Number: [phone] 08/18/2020, 1:43 PM  Clinical Narrative:     Patient has no bed offers.  TOC team will continue to assist with discharge planning needs.   Expected Discharge Plan: [place] Barriers to Discharge: Continued Medical Work up  Expected Discharge Plan and Services Expected Discharge Plan: [place] arrangements for the past 2 months: Hotel/Motel                                       Social Determinants of Health (SDOH) Interventions    Readmission Risk Interventions No flowsheet data found.

## 2020-08-18 NOTE — Progress Notes (Signed)
Family Medicine Teaching Service Daily Progress Note Intern Pager: [phone]  Patient name: Anthony [name] Medical record number: [id number] Date of birth: [birth year] Age: 65 y.o. Gender: male  Primary Care Provider: Carollee Leitz, MD Consultants: Cardiology Code Status: Full  Pt Overview and Major Events to Date:  01/25: Admitted to hospital  Assessment and Plan: Aydrian A Yeater is a 65 y.o. male who presents with urosepsis and A Fib in RVRdue to medication non-adherence. 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 Medically stable for discharge to SNF.  A Fib Stable. Controlled Afib.  Now on DOAC and tolerating well.  -Cardiology following and appreciate recommendations -Continue Amiodarone 200 mg po BID -Continue Metoprolol XR 25 mg daily, hold for SBP<100 or HR<60 -Continue Rivaroxaban 20 mg daily  UTI Improving. Sepsis resolved. Started Ceftriaxone on 01/27.  Switched to Keflex 01/28. Sensitivities returned and Amp resistant E.Coli.   -Continue Keflex 500 mg QID (01/28-01/31)  Encephalopathy Resolved.  He is at baseline.  MRI head show no acute intracranial abnormalities on 01/28. Findings consistent with mild chronic microvascular disease and volume loss -Continue to monitor  Transaminitis Improving.  AST now 42 and ALT wnl.  -CMP in am  Hypomagnesemia Magnesium today 1.4.  Chronic Hypomagnesemia.  Asymptomatic -Continue Mg Oxide 400 mg BID -Repeat in am  Hypoalbuminemia/ Protein deficiency Albumin and Protein continue to trend downward. Weight has decreased from 62.7kg to 60.2kg since admission -Encourage po intake -Consult dietitian to optimize protein intake -Daily weights  Alcohol use disorder Stable. CIWA discontinued. -Continue Folate and Thiamine  History of HTN Soft BP. Continue to monitor  FEN/GI: Dysphagia 3 PPx: Rivaroxaban   Status is: Inpatient  Remains  inpatient appropriate because:Unsafe d/c plan   Dispo:  Patient From: Home  Planned Disposition: [place]  Expected discharge date: 08/19/2020  Medically stable for discharge: Yes  Difficult to place patient: Yes   Subjective:  No acute events.  He reports no concerns,  Eating and drinking well.  He is orientated x 3 but thinks he is in the hospital because he was chasing me down the road.   Objective: Temp:  [97.9 F (36.6 C)-98.4 F (36.9 C)] 98.2 F (36.8 C) (01/30 0419) Pulse Rate:  [68-93] 93 (01/30 0419) Resp:  [16-18] 18 (01/30 0419) BP: (87-108)/(59-73) 108/64 (01/30 0419) SpO2:  [98 %-99 %] 99 % (01/30 0419) Weight:  [60.2 kg] 60.2 kg (01/30 0419) Physical Exam: General: Alert and in no acute distress Cardiovascular: Irregular rhythm and controlled rate, no murmurs, gallops heard Respiratory: CTAB, no IWOB, wheezing or crackles appreciated Abdomen: soft, non tender, BS present Extremities: no lower extremity edema, pulses present  Laboratory: Recent Labs  Lab 08/15/20 0321 08/16/20 0328 08/18/20 0411  WBC 7.7 9.0 7.4  HGB 9.7* 9.3* 9.3*  HCT 28.7* 27.5* 28.4*  PLT 259 255 288   Recent Labs  Lab 08/16/20 0328 08/17/20 0323 08/18/20 0411  NA 137 136 135  K 3.7 4.6 3.9  CL 109 110 105  CO2 18* 19* 23  BUN 12 10 8   CREATININE 0.60* 0.53* 0.55*  CALCIUM 8.5* 8.6* 8.5*  PROT 6.0* 5.6* 5.6*  BILITOT 0.3 0.6 0.5  ALKPHOS 100 93 93  ALT 59* 47* 41  AST 90* 49* 42*  GLUCOSE 92 87 90    Imaging/Diagnostic Tests: No results found.  Carollee Leitz, MD 08/18/2020, 5:24 AM PGY-2, [place] Intern  pager: [phone], text pages welcome

## 2020-08-19 ENCOUNTER — Inpatient Hospital Stay: Payer: Medicaid Other | Admitting: Family Medicine

## 2020-08-19 DIAGNOSIS — N390 Urinary tract infection, site not specified: Secondary | ICD-10-CM | POA: Diagnosis not present

## 2020-08-19 DIAGNOSIS — Z659 Problem related to unspecified psychosocial circumstances: Secondary | ICD-10-CM | POA: Diagnosis not present

## 2020-08-19 DIAGNOSIS — I4891 Unspecified atrial fibrillation: Secondary | ICD-10-CM | POA: Diagnosis not present

## 2020-08-19 DIAGNOSIS — A419 Sepsis, unspecified organism: Secondary | ICD-10-CM | POA: Diagnosis not present

## 2020-08-19 MED ORDER — HYDROCORTISONE 1 % EX CREA
TOPICAL_CREAM | Freq: Two times a day (BID) | CUTANEOUS | Status: DC
  Filled 2020-08-19 (×2): qty 28

## 2020-08-19 MED ORDER — ADULT MULTIVITAMIN W/MINERALS CH
1.0000 | ORAL_TABLET | Freq: Every day | ORAL | Status: DC
  Administered 2020-08-19 – 2020-10-09 (×52): 1 via ORAL
  Filled 2020-08-19 (×54): qty 1

## 2020-08-19 MED ORDER — ENSURE ENLIVE PO LIQD
237.0000 mL | Freq: Two times a day (BID) | ORAL | Status: DC
  Administered 2020-08-19 – 2020-10-09 (×85): 237 mL via ORAL

## 2020-08-19 MED ORDER — AMIODARONE HCL 200 MG PO TABS
200.0000 mg | ORAL_TABLET | Freq: Every day | ORAL | Status: DC
  Administered 2020-08-24 – 2020-10-09 (×47): 200 mg via ORAL
  Filled 2020-08-19 (×49): qty 1

## 2020-08-19 NOTE — Progress Notes (Signed)
Family Medicine Teaching Service Daily Progress Note Intern Pager: [phone]  Patient name: Anthony [name] Medical record number: [id number] Date of birth: 09-04-1955 Age: 65 y.o. Gender: male  Primary Care Provider: Carollee Leitz, MD Consultants: Cardiology   Code Status: Full Code  Pt Overview and Major Events to Date:  1/25 admitted  Assessment and Plan: Sehaj A Wirkkala is a 65 y.o. male who presents with urosepsis and A Fib in RVRdue to medication non-adherence. 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 Medically stable for discharge to SNF.  A Fib Previously in RVR, now resolved. Rate well controlled. - cardiology following, appreciate involvement - metoprolol succinate 25 mg - PO amiodarone 200 mg BID, plan to change to amiodarone 200 mg daily after 2/4 - rivaroxaban 20 mg  UTI No longer septic. Ampicillin resistant E. Coli. - cephalexin (1/28) x 5 days  Encephalopathy Resolved, at baseline. MRI unremarkable, no acute findings and no evidence of NPH.  Transaminitis Improving. Possibly secondary to LMWH.  Hypomagnesemia Mg 1.4 1/30. - magnesium oxide 400 mg BID  Alcohol use disorder Not in withdrawal, CIWAs have been discontinued. - folate, thiamine  History of HTN BP soft to normotensive. - monitor  FEN/GI: DYS 3 PPx: rivaroxaban  Disposition: SNF vs ALF, medically stable for discharge  Subjective:  NAOE. No concerns this AM.  Objective: Temp:  [98.2 F (36.8 C)-98.7 F (37.1 C)] 98.7 F (37.1 C) (01/31 0430) Pulse Rate:  [91-96] 96 (01/31 0430) Resp:  [16-18] 16 (01/31 0430) BP: (107-115)/(68-76) 111/68 (01/31 0430) SpO2:  [98 %-99 %] 99 % (01/31 0430) Physical Exam: General: unkempt male, NAD Cardiovascular: RRR, no murmurs Respiratory: CTAB, breathing comfortably on room air Abdomen: soft, non-tender, +BS Extremities: WWP, no edema Neuro:  A&Ox3  Laboratory: Recent Labs  Lab 08/15/20 0321 08/16/20 0328 08/18/20 0411  WBC 7.7 9.0 7.4  HGB 9.7* 9.3* 9.3*  HCT 28.7* 27.5* 28.4*  PLT 259 255 288   Recent Labs  Lab 08/16/20 0328 08/17/20 0323 08/18/20 0411  NA 137 136 135  K 3.7 4.6 3.9  CL 109 110 105  CO2 18* 19* 23  BUN 12 10 8   CREATININE 0.60* 0.53* 0.55*  CALCIUM 8.5* 8.6* 8.5*  PROT 6.0* 5.6* 5.6*  BILITOT 0.3 0.6 0.5  ALKPHOS 100 93 93  ALT 59* 47* 41  AST 90* 49* 42*  GLUCOSE 92 87 90    Imaging/Diagnostic Tests: No new imaging.   Zola Button, MD 08/19/2020, 6:52 AM PGY-1, [place] Intern pager: [phone], text pages welcome

## 2020-08-19 NOTE — Progress Notes (Signed)
Occupational Therapy Treatment Patient Details Name: Anthony [name] MRN: [id number] DOB: 01-25-1956 Today's Date: 08/19/2020    History of present illness Khoi A Henrickson is a 65 y.o. male presenting with urosepsis and A Fib in RVR. 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 Patient found by social work in his motel room naked in urine and feces with mild confusion. Upon EMS arrival, he was noted to be tachycardic with HR 210 with soft BP,.   OT comments  Pt making slow but steady progress with mobility and adls. Pt has some cognitive deficits (hard to know his baseline) which affect his safety with decision making and problem solving. Pt did manage his room on a walker without physical assist. Pt did need cues for safety and to remember to use the walker.  Feel he may forget to use walker after d/c and may see how he does with adls next session without it.  Will continue to follow to increase basic independence with adls and adl mobility.   Follow Up Recommendations  SNF    Equipment Recommendations  None recommended by OT    Recommendations for Other Services      Precautions / Restrictions Precautions Precautions: Fall Precaution Comments: incontinent Restrictions Weight Bearing Restrictions: No       Mobility Bed Mobility Overal bed mobility: Modified Independent             General bed mobility comments: HOB at 30  Transfers Overall transfer level: Needs assistance Equipment used: Rolling walker (2 wheeled) Transfers: Sit to/from Stand Sit to Stand: Supervision Stand pivot transfers: Supervision       General transfer comment: cues for hand placement on walker    Balance Overall balance assessment: Needs assistance Sitting-balance support: Feet supported;No upper extremity supported Sitting balance-Leahy Scale: Good     Standing balance support: Bilateral upper extremity  supported Standing balance-Leahy Scale: Fair Standing balance comment: Pt used rolling walker to walk to sink. 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                           ADL either performed or assessed with clinical judgement   ADL Overall ADL's : Needs assistance/impaired Eating/Feeding: Independent;Sitting   Grooming: Wash/dry hands;Wash/dry face;Oral care;Supervision/safety;Standing   Upper Body Bathing: Set up;Sitting;Cueing for compensatory techniques Upper Body Bathing Details (indicate cue type and reason): cues "how" to sponge bathe.  Could not problem solve it even with items right in front of him. Lower Body Bathing: Min guard;Sit to/from stand;Cueing for safety       Lower Body Dressing: Sitting/lateral leans;Sit to/from stand;Min guard Lower Body Dressing Details (indicate cue type and reason): Pt donned socks without assist. Only needs assist to stand due to safety. Toilet Transfer: Designer, television/film set Details (indicate cue type and reason): used rolling walker to walk to bathroom to toilet Toileting- Water quality scientist and Hygiene: Min guard;Sit to/from stand Toileting - Clothing Manipulation Details (indicate cue type and reason): assist to manage clothes when both hands off walker.     Functional mobility during ADLs: Min guard;Rolling walker General ADL Comments: Pt limited by decreased cognitive status, decreased abilityto care for self and decreased safety awareness. Pt's O2>90% on RA. HR <120 BPM with exertion     Vision   Vision Assessment?: No apparent visual  deficits Additional Comments: pt states he has had cataract surgery   Perception     Praxis      Cognition Arousal/Alertness: Awake/alert Behavior During Therapy: WFL for tasks assessed/performed Overall Cognitive Status: No family/caregiver present to determine baseline cognitive functioning Area of Impairment:  Orientation;Safety/judgement;Awareness;Problem solving                 Orientation Level: Time;Situation Current Attention Level: Selective Memory: Decreased recall of precautions;Decreased short-term memory Following Commands: Follows one step commands consistently;Follows multi-step commands with increased time Safety/Judgement: Decreased awareness of safety;Decreased awareness of deficits Awareness: Emergent Problem Solving: Requires verbal cues General Comments: loses his train of though easily        Exercises     Shoulder Instructions       General Comments Pt cognitively has some limitations that make him a safety risk.  Pt was steadier on feet compared to eval and was motivated to get up and move around.    Pertinent Vitals/ Pain       Pain Assessment: No/denies pain  Home Living                                          Prior Functioning/Environment              Frequency  Min 2X/week        Progress Toward Goals  OT Goals(current goals can now be found in the care plan section)  Progress towards OT goals: Progressing toward goals  Acute Rehab OT Goals Patient Stated Goal: to be better and out of the hospital OT Goal Formulation: With patient Time For Goal Achievement: 08/21/20 Potential to Achieve Goals: Fair ADL Goals Pt Will Perform Grooming: with min guard assist;standing Pt Will Perform Lower Body Dressing: with modified independence;sit to/from stand Pt Will Transfer to Toilet: with supervision;ambulating;bedside commode Pt Will Perform Toileting - Clothing Manipulation and hygiene: with supervision;sit to/from stand Pt/caregiver will Perform Home Exercise Program: Increased strength;Both right and left upper extremity;With written HEP provided Additional ADL Goal #1: Pt will complete x10 mins of ADL tasks with 1 seated rest break with minguardA overall Additional ADL Goal #2: Pt will perform higher level cognitive task  with minimal cues to attend.  Plan Discharge plan remains appropriate    Co-evaluation                 AM-PAC OT "6 Clicks" Daily Activity     Outcome Measure   Help from another person eating meals?: None Help from another person taking care of personal grooming?: None Help from another person toileting, which includes using toliet, bedpan, or urinal?: A Letson Help from another person bathing (including washing, rinsing, drying)?: A Marcantel Help from another person to put on and taking off regular upper body clothing?: A Matzke Help from another person to put on and taking off regular lower body clothing?: A [place] 6 Click Score: 20    End of Session Equipment Utilized During Treatment: Gait belt;Rolling walker  OT Visit Diagnosis: Unsteadiness on feet (R26.81);Other symptoms and signs involving cognitive function   Activity Tolerance Patient tolerated treatment well   Patient Left in bed;with call bell/phone within reach;with bed alarm set   Nurse Communication Mobility status        Time: 1100-1116 OT Time Calculation (min): 16 min  Charges: OT General Charges $OT Visit: 1 Visit OT Treatments $  Self Care/Home Management : 8-22 mins   Glenford Peers 08/19/2020, 11:27 AM

## 2020-08-19 NOTE — Progress Notes (Signed)
Physical Therapy Treatment Patient Details Name: Anthony [name] MRN: [id number] DOB: 14-Dec-1955 Today's Date: 08/19/2020    History of Present Illness Anthony [name] is a 65 y.o. male presenting with urosepsis and A Fib in RVR. 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 Patient found by social work in his motel room naked in urine and feces with mild confusion. Upon EMS arrival, he was noted to be tachycardic with HR 210 with soft BP,.    PT Comments    Pt making steady progress with mobility. Currently recommend SNF but likely will eventually need supervised setting like ALF.    Follow Up Recommendations  SNF (likely to progress to ALF)     Equipment Recommendations  Other (comment) (rollator)    Recommendations for Other Services       Precautions / Restrictions Precautions Precautions: Fall Precaution Comments: incontinent Restrictions Weight Bearing Restrictions: No    Mobility  Bed Mobility Overal bed mobility: Modified Independent             General bed mobility comments: HOB at 30  Transfers Overall transfer level: Needs assistance Equipment used: 4-wheeled walker;None Transfers: Sit to/from Stand Sit to Stand: Supervision Stand pivot transfers: Supervision       General transfer comment: cues for safety  Ambulation/Gait Ambulation/Gait assistance: Min guard;Supervision Gait Distance (Feet): 400 Feet Assistive device: 4-wheeled walker;None Gait Pattern/deviations: Step-through pattern;Decreased stride length;Drifts right/left Gait velocity: decr Gait velocity interpretation: 1.31 - 2.62 ft/sec, indicative of limited community ambulator General Gait Details: Min guard for balance when not using assistive device. Supervision for safety when using rollator.   Stairs             Wheelchair Mobility    Modified Rankin (Stroke Patients Only)        Balance Overall balance assessment: Needs assistance Sitting-balance support: Feet supported;No upper extremity supported Sitting balance-Leahy Scale: Good     Standing balance support: Bilateral upper extremity supported Standing balance-Leahy Scale: Fair Standing balance comment: Pt used rolling walker to walk to sink. 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                            Cognition Arousal/Alertness: Awake/alert Behavior During Therapy: WFL for tasks assessed/performed Overall Cognitive Status: No family/caregiver present to determine baseline cognitive functioning Area of Impairment: Orientation;Safety/judgement;Awareness;Problem solving                 Orientation Level: Time;Situation Current Attention Level: Selective Memory: Decreased recall of precautions;Decreased short-term memory Following Commands: Follows one step commands consistently;Follows multi-step commands with increased time Safety/Judgement: Decreased awareness of safety;Decreased awareness of deficits Awareness: Emergent Problem Solving: Requires verbal cues General Comments: loses his train of though easily      Exercises      General Comments General comments (skin integrity, edema, etc.): Pt cognitively has some limitations that make him a safety risk.  Pt was steadier on feet compared to eval and was motivated to get up and move around.      Pertinent Vitals/Pain Pain Assessment: No/denies pain    Home Living                      Prior Function            PT Goals (current  goals can now be found in the care plan section) Acute Rehab PT Goals Patient Stated Goal: to be better and out of the hospital Progress towards PT goals: Progressing toward goals    Frequency    Min 3X/week      PT Plan Current plan remains appropriate    Co-evaluation              AM-PAC PT "6 Clicks" Mobility    Outcome Measure  Help needed turning from your back to your side while in a flat bed without using bedrails?: None Help needed moving from lying on your back to sitting on the side of a flat bed without using bedrails?: None Help needed moving to and from a bed to a chair (including a wheelchair)?: A Serio Help needed standing up from a chair using your arms (e.g., wheelchair or bedside chair)?: A Nodine Help needed to walk in hospital room?: A Leisey Help needed climbing 3-5 steps with a railing? : A Buhman 6 Click Score: 20    End of Session   Activity Tolerance: Patient tolerated treatment well Patient left: in chair;with call bell/phone within reach;with chair alarm set   PT Visit Diagnosis: Unsteadiness on feet (R26.81)     Time: 0786-7544 PT Time Calculation (min) (ACUTE ONLY): 20 min  Charges:  $Gait Training: 8-22 mins                     [place] Pager [phone] Office [place] 08/19/2020, 1:13 PM

## 2020-08-19 NOTE — Progress Notes (Signed)
TOC CM/CSW received a call from Cristy Friedlander DSS/APS [phone].  Minna Merritts was in search of CSW assigned to pt.  This CSW gave Minna Merritts the [employer] name.  Deajah Erkkila Tarpley-Carter, MSW, LCSW-A Pronouns:  She, Her, Hers                  [facility] Transitions of CareClinical Social Worker [email or web address] [phone]

## 2020-08-19 NOTE — Progress Notes (Addendum)
Progress Note  Patient Name: Anthony [name] Date of Encounter: 08/19/2020  [facility] Cardiologist: Lauree Chandler, MD   Subjective   Sitting up in bed. Eating breakfast.   Inpatient Medications    Scheduled Meds: . amiodarone  200 mg Oral BID  . cephALEXin  250 mg Oral Q6H  . ferrous sulfate  325 mg Oral Once per day on Mon Thu  . folic acid  1 mg Oral Daily  . hydrocortisone cream   Topical BID  . magnesium oxide  400 mg Oral BID  . metoprolol succinate  25 mg Oral Daily  . rivaroxaban  20 mg Oral Q supper  . sertraline  200 mg Oral Daily  . thiamine  100 mg Oral Daily   Continuous Infusions: . sodium chloride Stopped (08/16/20 0118)   PRN Meds: sodium chloride, hydroxypropyl methylcellulose / hypromellose   Vital Signs    Vitals:   08/18/20 0934 08/18/20 1720 08/18/20 2001 08/19/20 0430  BP: 107/72 110/74 115/76 111/68  Pulse:   91 96  Resp: 18  18 16   Temp:  98.4 F (36.9 C) 98.2 F (36.8 C) 98.7 F (37.1 C)  TempSrc:  Oral Oral Oral  SpO2:   98% 99%  Weight:      Height:        Intake/Output Summary (Last 24 hours) at 08/19/2020 0750 Last data filed at 08/19/2020 0700 Gross per 24 hour  Intake 480 ml  Output 850 ml  Net -370 ml   Last 3 Weights 08/18/2020 08/17/2020 08/16/2020  Weight (lbs) 132 lb 12.8 oz 154 lb 5.2 oz 142 lb 10.2 oz  Weight (kg) 60.238 kg 70 kg 64.7 kg      Telemetry    Not on telemetry this morning   ECG    No new tracing  Physical Exam  Thin, frail older WM sitting up in bed GEN: No acute distress.   Neck: No JVD Cardiac: RRR, no murmurs, rubs, or gallops.  Respiratory: Clear to auscultation bilaterally. GI: Soft, nontender, non-distended  MS: No edema; No deformity. Neuro:  Nonfocal  Psych: Normal affect   Labs    High Sensitivity Troponin:  No results for input(s): TROPONINIHS in the last 720 hours.    Chemistry Recent Labs  Lab 08/16/20 0328 08/17/20 0323 08/18/20 0411  NA 137 136 135  K 3.7  4.6 3.9  CL 109 110 105  CO2 18* 19* 23  GLUCOSE 92 87 90  BUN 12 10 8   CREATININE 0.60* 0.53* 0.55*  CALCIUM 8.5* 8.6* 8.5*  PROT 6.0* 5.6* 5.6*  ALBUMIN 2.1* 2.0* 2.0*  AST 90* 49* 42*  ALT 59* 47* 41  ALKPHOS 100 93 93  BILITOT 0.3 0.6 0.5  GFRNONAA >60 >60 >60  ANIONGAP 10 7 7      Hematology Recent Labs  Lab 08/15/20 0321 08/16/20 0328 08/18/20 0411  WBC 7.7 9.0 7.4  RBC 2.73* 2.61* 2.68*  HGB 9.7* 9.3* 9.3*  HCT 28.7* 27.5* 28.4*  MCV 105.1* 105.4* 106.0*  MCH 35.5* 35.6* 34.7*  MCHC 33.8 33.8 32.7  RDW 12.7 12.7 12.6  PLT 259 255 288    BNPNo results for input(s): BNP, PROBNP in the last 168 hours.   DDimer No results for input(s): DDIMER in the last 168 hours.   Radiology    No results found.  Cardiac Studies   N/a  Patient Profile     65 y.o. male with a hx of Afib, HTN, ETOH abuse, Diastolic HF,  chronic back pain, depression and unprovoked PEwhowas seenfor the evaluation of Afib.  Assessment & Plan    1. Atrialfibrillation with GLO:VFIEPPIRJ to SR and has maintained. On Amiodarone 264m BID, along with Toprol XL 254mdaily. Has been restarted on Xarelto with plans for SNF at discharge per primary. --This patients CHA2DS2-VASc Scoreof 2. Planning to discharge to controlled environment with SNF.  -- would continue on amiodarone 20020mID until 2/4 then transition to 200m59mily.  2. AMS with sepsis/UTI: met sepsis criteria on admission, now resolved. -- Treated with antibiotics per primary. BC neg to date. Lactic improved.   3. ETOH use:On CIWA protocol   4. Hypokalemia/hypomag:Mag 1.4 yesterday -- Supplement per primary  5. Hx of PE:uJO:ACZYSAYTKZ chart review from 2018. Had been on Xarelto prior to admission.  6. Social issues:had been discharged to motel after last admission. Noted that he was unable to appropriately care for himself during the brief time he was discharged. SNF recommended during last admission but he denied.  Appears plan is discharge to SNF at this time.   [facility] will sign off.   Medication Recommendations:  Noted above Other recommendations (labs, testing, etc):  none Follow up as an outpatient:  Will arrange for outpatient follow up in 3-4 weeks  For questions or updates, please contact [facility] consult [email or web address] for contact info under        Signed, LindReino Bellis  08/19/2020, 7:50 AM    Patient seen, examined. Available data reviewed. Agree with findings, assessment, and plan as outlined by LindReino Bellis.  The patient is independently interviewed and examined.  He is sitting up in bed eating breakfast.  Alert, oriented, no distress.  JVP normal, lungs clear, heart regular rate and rhythm no murmur gallop, abdomen soft nontender, extremities no edema, skin warm and dry without rash, neurologic: Moves all extremities.  The patient is now off of telemetry.  His exam clearly indicates normal sinus rhythm.  He is doing well on amiodarone.  He is now anticoagulated with rivaroxaban and this should be a reasonable treatment approach as disposition suggest he is going to skilled nursing.  Continue amiodarone with dosing outlined above.  Will sign off today as he is stable from a cardiac perspective.  Please call if any further issues arise.  MichSherren MochaD. 08/19/2020 9:08 AM

## 2020-08-19 NOTE — TOC Progression Note (Signed)
Transition of Care [facility]) - Progression Note    Patient Details  Name: Anthony [name] MRN: [id number] Date of Birth: 10/03/1955  Transition of Care [facility]) CM/SW [place], [place] Phone Number:  [phone] 08/19/2020, 12:34 PM  Clinical Narrative:     CSW received call from [facility] [phone], from [place] requesting update on patient.  CSW gave Ms. Cordelia Pen CSW contact information.  Expected Discharge Plan: [place] Barriers to Discharge: Continued Medical Work up  Expected Discharge Plan and Services Expected Discharge Plan: [place] arrangements for the past 2 months: Hotel/Motel                                       Social Determinants of Health (SDOH) Interventions    Readmission Risk Interventions No flowsheet data found.

## 2020-08-19 NOTE — Progress Notes (Signed)
Initial Nutrition Assessment  DOCUMENTATION CODES:   Not applicable  INTERVENTION:   -MVI with minerals daily -Ensure Enlive po BID, each supplement provides 350 kcal and 20 grams of protein  NUTRITION DIAGNOSIS:   Predicted suboptimal nutrient intake related to social / environmental circumstances as evidenced by per patient/family report.  GOAL:   Patient will meet greater than or equal to 90% of their needs  MONITOR:   PO intake,Supplement acceptance,Labs,Weight trends,Skin,I & O's  REASON FOR ASSESSMENT:   Consult Assessment of nutrition requirement/status  ASSESSMENT:   Anthony [name] is a 65 y.o. male presenting with urosepsis and A Fib in RVR. 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  Pt admitted with AMS, sepsis, and UTI.   1/26- s/p BSE- advanced to dysphagia 3 diet with thin liquids  Reviewed I/O's: -370 ml x 24 hours and -1.6 L since admission  UOP: 850 ml x 24 hours  Pt unavailable at time of visit.   Pt with good appetite currently. Noted meal completion 50-100%.   Reviewed wt hx; pt has a distant history of weight loss.   Pt with difficult social situation and history of ETOH abuse. Suspect possible food insecurity and diet of poor nutritional quality PTA. Pt is at high risk for malnutrition and would benefit from addition of oral nutrition supplements.   Medications reviewed and include ferrous sulfate and folic acid.   Per MD notes, pt is medically stable for discharge. He is awaiting SNF placement.   Albumin has a half-life of 21 days and is strongly affected by stress response and inflammatory process, therefore, do not expect to see an improvement in this lab value during acute hospitalization. When a patient presents with low albumin, it is likely skewed due to the acute inflammatory response.  Unless it is suspected that patient had poor PO intake or  malnutrition prior to admission, then RD should not be consulted solely for low albumin. Note that low albumin is no longer used to diagnose malnutrition; [place] uses the new malnutrition guidelines published by the American Society for Parenteral and Enteral Nutrition (A.S.P.E.N.) and the Academy of Nutrition and Dietetics (AND).    Labs reviewed: Mg: 1.4 On on PO supplementation), CBGS: 82.   Diet Order:   Diet Order            DIET DYS 3 Room service appropriate? No; Fluid consistency: Thin  Diet effective now                 EDUCATION NEEDS:   No education needs have been identified at this time  Skin:  Skin Assessment: Skin Integrity Issues: Skin Integrity Issues:: Stage I Stage I: coccyx  Last BM:  08/18/20  Height:   Ht Readings from Last 1 Encounters:  08/16/20 5\' 8"  (1.727 m)    Weight:   Wt Readings from Last 1 Encounters:  08/18/20 60.2 kg    Ideal Body Weight:  70 kg  BMI:  Body mass index is 20.19 kg/m.  Estimated Nutritional Needs:   Kcal:  1900-2100  Protein:  105-120 grams  Fluid:  > 1.9 L    Loistine Chance, RD, LDN, [place] Registered Dietitian II Certified Diabetes Care and Education Specialist Please refer to [facility] for RD and/or RD on-call/weekend/after hours pager

## 2020-08-19 NOTE — Plan of Care (Signed)
  Problem: Nutrition: Goal: Adequate nutrition will be maintained Outcome: Progressing   Problem: Elimination: Goal: Will not experience complications related to bowel motility Outcome: Progressing   

## 2020-08-20 DIAGNOSIS — Z659 Problem related to unspecified psychosocial circumstances: Secondary | ICD-10-CM | POA: Diagnosis not present

## 2020-08-20 DIAGNOSIS — A419 Sepsis, unspecified organism: Secondary | ICD-10-CM | POA: Diagnosis not present

## 2020-08-20 DIAGNOSIS — I4891 Unspecified atrial fibrillation: Secondary | ICD-10-CM | POA: Diagnosis not present

## 2020-08-20 DIAGNOSIS — N390 Urinary tract infection, site not specified: Secondary | ICD-10-CM | POA: Diagnosis not present

## 2020-08-20 LAB — BASIC METABOLIC PANEL
Anion gap: 10 (ref 5–15)
BUN: 11 mg/dL (ref 8–23)
CO2: 24 mmol/L (ref 22–32)
Calcium: 8.6 mg/dL — ABNORMAL LOW (ref 8.9–10.3)
Chloride: 100 mmol/L (ref 98–111)
Creatinine, Ser: 0.68 mg/dL (ref 0.61–1.24)
GFR, Estimated: >60 mL/min (ref >60)
Glucose, Bld: 90 mg/dL (ref 70–99)
Potassium: 4 mmol/L (ref 3.5–5.1)
Sodium: 134 mmol/L — ABNORMAL LOW (ref 135–145)

## 2020-08-20 LAB — MAGNESIUM: Magnesium: 1.5 mg/dL — ABNORMAL LOW (ref 1.7–2.4)

## 2020-08-20 NOTE — Progress Notes (Addendum)
Family Medicine Teaching Service Daily Progress Note Intern Pager: [phone]  Patient name: Anthony [name] Medical record number: [id number] Date of birth: 1955-07-26 Age: 65 y.o. Gender: male  Primary Care Provider: Carollee Leitz, MD Consultants: Cardiology  Code Status: Full  Pt Overview and Major Events to Date:  1/25 admitted  Assessment and Plan: Norma A Treto is a 65 y.o. male who presents with urosepsis and A Fib in RVRdue to medication non-adherence. 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 Medically stable for discharge to SNF.  A Fib Previously in RVR, now resolved. Rate well controlled. Cardiology previously following, has signed off at this time.  - metoprolol succinate 25 mg - PO amiodarone 200 mg BID, plan to change to amiodarone 200 mg daily after 2/4 - rivaroxaban 20 mg  UTI No longer septic. Ampicillin resistant E. Coli. - continue cephalexin (1/28) x 5 days  Encephalopathy Resolved, at baseline. MRI unremarkable, no acute findings and no evidence of NPH. Patient fully alert and oriented this morning.   Hypomagnesemia Mg 1.5 this morning. Has been a chronic issue since prior to this admission.  - magnesium oxide 400 mg BID  Alcohol use disorder Not in withdrawal, CIWAs have been discontinued. - continue folate - continue thiamine  History of HTN Hypotensive this morning 87/55, previously within normotensive range. Called nurse to ensure that BP was taken with appropriate cuff size. Repeat BP 105/89.  - monitor BP - consider holding metoprolol if consistently hypotensive   History of depression & anxiety -continue sertraline 200 mg daily   FEN/GI: dysphagia 3 diet  PPx: rivaroxaban    Status is: Inpatient  Dispo: pending SNF placement      Subjective:  No significant overnight events. Patient denies dizziness but states that he only stands up with  assistance. No further concerns at this time.   Objective: Temp:  [97.6 F (36.4 C)-99.5 F (37.5 C)] 99.5 F (37.5 C) (02/01 0506) Pulse Rate:  [76-83] 76 (02/01 0506) Resp:  [16] 16 (02/01 0506) BP: (87-108)/(55-73) 87/55 (02/01 0506) SpO2:  [96 %-100 %] 96 % (02/01 0506) Weight:  [61.9 kg] 61.9 kg (02/01 0506) Physical Exam: General: Patient well-appearing, sitting upright in bed eating breakfast. Cardiovascular:  Respiratory: RRR, no murmurs or gallops auscultated Abdomen: soft, nontender, presence of active bowel sounds Extremities: radial and distal pulses strong and equal bilaterally, no LE edema noted bilaterally Neuro: AOx3, cooperative Psych: mood appropriate   Laboratory: Recent Labs  Lab 08/15/20 0321 08/16/20 0328 08/18/20 0411  WBC 7.7 9.0 7.4  HGB 9.7* 9.3* 9.3*  HCT 28.7* 27.5* 28.4*  PLT 259 255 288   Recent Labs  Lab 08/16/20 0328 08/17/20 0323 08/18/20 0411 08/20/20 0159  NA 137 136 135 134*  K 3.7 4.6 3.9 4.0  CL 109 110 105 100  CO2 18* 19* 23 24  BUN 12 10 8 11   CREATININE 0.60* 0.53* 0.55* 0.68  CALCIUM 8.5* 8.6* 8.5* 8.6*  PROT 6.0* 5.6* 5.6*  --   BILITOT 0.3 0.6 0.5  --   ALKPHOS 100 93 93  --   ALT 59* 47* 41  --   AST 90* 49* 42*  --   GLUCOSE 92 87 90 90      Imaging/Diagnostic Tests: No results found.  Donney Dice, DO 08/20/2020, 6:48 AM PGY-1, [place] Intern pager: [phone], text pages welcome

## 2020-08-20 NOTE — Progress Notes (Signed)
Pt BP 87/55 with MAP 66; Rock Nephew, MD made aware, awaiting orders. Will continue to monitor.  Elaina Hoops, RN

## 2020-08-20 NOTE — TOC Progression Note (Signed)
Transition of Care [facility]) - Progression Note    Patient Details  Name: Anthony [name] MRN: [id number] Date of Birth: 1956-06-30  Transition of Care [facility]) CM/SW [place], [place] Phone Number: 08/20/2020, 2:31 PM  Clinical Narrative:     CSW spoke with patients Niece and let her know that patient currently has no SNF bed offers. Patients niece agreed for CSW to fax out patient further to see if we can get SNF bed offers for patient.  CSW will continue to follow.    Expected Discharge Plan: [place] Barriers to Discharge: Continued Medical Work up  Expected Discharge Plan and Services Expected Discharge Plan: [place] arrangements for the past 2 months: Hotel/Motel                                       Social Determinants of Health (SDOH) Interventions    Readmission Risk Interventions No flowsheet data found.

## 2020-08-21 DIAGNOSIS — Z659 Problem related to unspecified psychosocial circumstances: Secondary | ICD-10-CM | POA: Diagnosis not present

## 2020-08-21 DIAGNOSIS — F1020 Alcohol dependence, uncomplicated: Secondary | ICD-10-CM

## 2020-08-21 DIAGNOSIS — I4891 Unspecified atrial fibrillation: Secondary | ICD-10-CM | POA: Diagnosis not present

## 2020-08-21 DIAGNOSIS — N390 Urinary tract infection, site not specified: Secondary | ICD-10-CM | POA: Diagnosis not present

## 2020-08-21 LAB — BASIC METABOLIC PANEL
Anion gap: 11 (ref 5–15)
BUN: 16 mg/dL (ref 8–23)
CO2: 25 mmol/L (ref 22–32)
Calcium: 8.6 mg/dL — ABNORMAL LOW (ref 8.9–10.3)
Chloride: 98 mmol/L (ref 98–111)
Creatinine, Ser: 0.71 mg/dL (ref 0.61–1.24)
GFR, Estimated: >60 mL/min (ref >60)
Glucose, Bld: 87 mg/dL (ref 70–99)
Potassium: 4.1 mmol/L (ref 3.5–5.1)
Sodium: 134 mmol/L — ABNORMAL LOW (ref 135–145)

## 2020-08-21 LAB — MAGNESIUM: Magnesium: 1.5 mg/dL — ABNORMAL LOW (ref 1.7–2.4)

## 2020-08-21 MED ORDER — ACETAMINOPHEN 325 MG PO TABS
650.0000 mg | ORAL_TABLET | Freq: Four times a day (QID) | ORAL | Status: DC | PRN
  Administered 2020-08-21 – 2020-09-03 (×17): 650 mg via ORAL
  Filled 2020-08-21 (×17): qty 2

## 2020-08-21 MED ORDER — SERTRALINE HCL 100 MG PO TABS
100.0000 mg | ORAL_TABLET | Freq: Every day | ORAL | Status: DC
  Administered 2020-08-22 – 2020-10-09 (×49): 100 mg via ORAL
  Filled 2020-08-21 (×50): qty 1
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
## 2020-08-21 NOTE — Progress Notes (Signed)
Physical Therapy Treatment Patient Details Name: Anthony [name] MRN: [id number] DOB: 09/07/1955 Today's Date: 08/21/2020    History of Present Illness Anthony [name] is a 65 y.o. male presenting with urosepsis and A Fib in RVR. 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 Patient found by social work in his motel room naked in urine and feces with mild confusion. Upon EMS arrival, he was noted to be tachycardic with HR 210 with soft BP,.    PT Comments    Patient progressing slowly towards PT goals. Improved ambulation distance without use of DME but requires Min A due to LOB towards right side needing support to prevent fall. Also noted to have 2/4 DOE. Pt with slow gait speed putting pt at increased risk for falls; declines using any DME. Pt tangential in speed throughout session and needs to be redirected often. Encouraged increasing activity while in the hospital. Will follow.  Follow Up Recommendations  SNF     Equipment Recommendations  Other (comment) (rollator)    Recommendations for Other Services       Precautions / Restrictions Precautions Precautions: Fall Restrictions Weight Bearing Restrictions: No    Mobility  Bed Mobility Overal bed mobility: Modified Independent Bed Mobility: Supine to Sit;Sit to Supine     Supine to sit: Modified independent (Device/Increase time) Sit to supine: Modified independent (Device/Increase time)   General bed mobility comments: HOB at 30, no assist needed. initially sat up but returned to supine after 1 minute due to back pain. Performed x2.  Transfers Overall transfer level: Needs assistance Equipment used: None Transfers: Sit to/from Stand Sit to Stand: Min guard         General transfer comment: Min guard for safety, stood from EOB x1. Declined transfer to chair.  Ambulation/Gait Ambulation/Gait assistance: Min assist;Min guard Gait  Distance (Feet): 500 Feet Assistive device: None Gait Pattern/deviations: Step-through pattern;Decreased stride length;Drifts right/left;Narrow base of support Gait velocity: 1.29 ft/sec Gait velocity interpretation: <1.8 ft/sec, indicate of risk for recurrent falls General Gait Details: Slow, unsteady gait with downward gaze and narrow BoS; drifting in both directions esp to the right with 1 LOB requiring assist to prevent fall. Cues to look straight ahead. Declined using DME. 2/4 DOE noted.   Stairs             Wheelchair Mobility    Modified Rankin (Stroke Patients Only)       Balance Overall balance assessment: Needs assistance Sitting-balance support: Feet supported;No upper extremity supported Sitting balance-Leahy Scale: Good Sitting balance - Comments: Limited sitting tolerance due to back pain.   Standing balance support: During functional activity Standing balance-Leahy Scale: Fair                              Cognition Arousal/Alertness: Awake/alert Behavior During Therapy: WFL for tasks assessed/performed Overall Cognitive Status: No family/caregiver present to determine baseline cognitive functioning Area of Impairment: Orientation;Attention;Memory;Following commands;Safety/judgement;Problem solving                 Orientation Level: Disoriented to;Time;Situation Current Attention Level: Selective Memory: Decreased recall of precautions;Decreased short-term memory Following Commands: Follows one step commands consistently;Follows multi-step commands with increased time Safety/Judgement: Decreased awareness of safety;Decreased awareness of deficits   Problem Solving: Slow processing;Requires verbal cues General Comments: Tangential, needs cues to be redirected and stay on task.  Exercises      General Comments        Pertinent Vitals/Pain Pain Assessment: Faces Faces Pain Scale: Hurts Way more Pain Location: chronic back  pain Pain Descriptors / Indicators: Sore Pain Intervention(s): Monitored during session;Repositioned    Home Living                      Prior Function            PT Goals (current goals can now be found in the care plan section) Progress towards PT goals: Progressing toward goals    Frequency    Min 3X/week      PT Plan Current plan remains appropriate    Co-evaluation              AM-PAC PT "6 Clicks" Mobility   Outcome Measure  Help needed turning from your back to your side while in a flat bed without using bedrails?: None Help needed moving from lying on your back to sitting on the side of a flat bed without using bedrails?: None Help needed moving to and from a bed to a chair (including a wheelchair)?: A [place] Help needed standing up from a chair using your arms (e.g., wheelchair or bedside chair)?: A [place] Help needed to walk in hospital room?: A [place] Help needed climbing 3-5 steps with a railing? : A [place] 6 Click Score: 20    End of Session Equipment Utilized During Treatment: Gait belt Activity Tolerance: Patient tolerated treatment well Patient left: in bed;with call bell/phone within reach;with bed alarm set Nurse Communication: Mobility status PT Visit Diagnosis: Unsteadiness on feet (R26.81)     Time: 1336-1400 PT Time Calculation (min) (ACUTE ONLY): 24 min  Charges:  $Gait Training: 23-37 mins                     Marisa Severin, PT, DPT Acute Rehabilitation Services Pager [phone] Office [place] 08/21/2020, 3:11 PM

## 2020-08-21 NOTE — Progress Notes (Signed)
Sister Stryker Veasey [phone] would please like MD to call with update information. Per patient okay to give details. Sister also wanted to make known that she lives in [place].

## 2020-08-21 NOTE — Progress Notes (Signed)
Nutrition Follow-up  DOCUMENTATION CODES:   Severe malnutrition in context of social or environmental circumstances  INTERVENTION:   -Continue Ensure Enlive po BID, each supplement provides 350 kcal and 20 grams of protein -Continue MVI with minerals daily  NUTRITION DIAGNOSIS:   Severe Malnutrition related to social / environmental circumstances as evidenced by moderate fat depletion,severe fat depletion,moderate muscle depletion,severe muscle depletion.  Ongoing  GOAL:   Patient will meet greater than or equal to 90% of their needs  Progressing   MONITOR:   PO intake,Supplement acceptance,Labs,Weight trends,Skin,I & O's  REASON FOR ASSESSMENT:   Consult Assessment of nutrition requirement/status  ASSESSMENT:   Anthony [name] is a 65 y.o. male presenting with urosepsis and A Fib in RVR. 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  Reviewed I/O's: -450 ml x 24 hours and -2.1 L since admission  UOP: 950 ml x 24 hours  Pt sleeping soundly at time of visit. He did not respond to voice or touch. Noted lunch and breakfast trays unattempted.  Pt with fair intake. Noted meal completions 50-100%. Pt is consuming Ensure Enlive supplements.   Per MD notes, pt is medically stable for discharge and awaiting SNF placement.   Labs reviewed: Na: 134. Mg: 1.5 (on PO supplementation).   NUTRITION - FOCUSED PHYSICAL EXAM:  Flowsheet Row Most Recent Value  Orbital Region Severe depletion  Upper Arm Region Moderate depletion  Thoracic and Lumbar Region Unable to assess  Buccal Region Severe depletion  Temple Region Severe depletion  Clavicle Bone Region Moderate depletion  Clavicle and Acromion Bone Region Moderate depletion  Scapular Bone Region Moderate depletion  Dorsal Hand Severe depletion  Patellar Region Severe depletion  Anterior Thigh Region Severe depletion  Posterior Calf Region  Severe depletion  Edema (RD Assessment) None  Hair Reviewed  Eyes Reviewed  Mouth Reviewed  Skin Reviewed  Nails Reviewed       Diet Order:   Diet Order            DIET DYS 3 Room service appropriate? No; Fluid consistency: Thin  Diet effective now                 EDUCATION NEEDS:   No education needs have been identified at this time  Skin:  Skin Assessment: Skin Integrity Issues: Skin Integrity Issues:: Stage I Stage I: coccyx  Last BM:  08/18/20  Height:   Ht Readings from Last 1 Encounters:  08/20/20 5\' 8"  (1.727 m)    Weight:   Wt Readings from Last 1 Encounters:  08/20/20 61.9 kg    Ideal Body Weight:  70 kg  BMI:  Body mass index is 20.75 kg/m.  Estimated Nutritional Needs:   Kcal:  1900-2100  Protein:  105-120 grams  Fluid:  > 1.9 L    Loistine Chance, RD, LDN, [place] Registered Dietitian II Certified Diabetes Care and Education Specialist Please refer to [facility] for RD and/or RD on-call/weekend/after hours pager

## 2020-08-21 NOTE — Progress Notes (Signed)
Family Medicine Teaching Service Daily Progress Note Intern Pager: [phone]  Patient name: Anthony [name] Medical record number: [id number] Date of birth: October 30, 1955 Age: 65 y.o. Gender: male  Primary Care Provider: Carollee Leitz, MD Consultants: Cardiology  Code Status: Full  Pt Overview and Major Events to Date:  1/25 Admitted   Assessment and Plan: Anthony A Littleis a 65 y.o.malewho presents with urosepsis and A Fib in RVRdue to medication non-adherence. 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 Medically stable for discharge to SNF.  A Fib Previously in RVR, now resolved.Rate well controlled. Cardiology previously following, has signed off.  - metoprolol succinate 25 mg - PO amiodarone 200 mg BID, plan to change to amiodarone 200 mg daily after 2/4 - rivaroxaban 20 mg  UTI No longer septic. Ampicillin resistant E. Coli. - continue cephalexin(1/28)x 5 days  Encephalopathy MRI unremarkable, no acute findings and no evidence of NPH. AOx2, unable to be able to report the reason for his hospitalization.   Hypomagnesemia Mg 1.5 this morning. Has been a chronic issue since prior to this admission.  - magnesium oxide 400 mg BID - continue to monitor   Alcohol use disorder Not in withdrawal, CIWAs have been discontinued. - continue folate - continue thiamine  History of HTN Hypotensive this morning 93/64, previously within normotensive range.  - monitor BP - consider holding metoprolol if consistently hypotensive   History of depression & anxiety -continue sertraline 200 mg daily   Muscle cramps Patient complained of leg cramps overnight, requested tylenol. Currently not reporting any pain or cramping as tylenol resolved his symptoms. States that he gets these regular cramps occasionally. Possibly due to hypomagnesemia.  -likely resolved -continue magnesium oxide  supplementation daily    FEN/GI: dysphagia 3 diet  PPx: rivaroxaban    Status is: Inpatient    Dispo: pending SNF placement       Subjective:  No acute overnight events. Patient denying any concerns or pain at this time.   Objective: Temp:  [98 F (36.7 C)-98.4 F (36.9 C)] 98.1 F (36.7 C) (02/02 0508) Pulse Rate:  [79-81] 80 (02/02 0508) Resp:  [16-17] 17 (02/02 0508) BP: (93-107)/(63-89) 93/64 (02/02 0508) SpO2:  [97 %-98 %] 98 % (02/02 0508) Physical Exam: General: Patient sitting upright in bed comfortably, in no acute distress. Cardiovascular: irregular rhythm, no murmurs or gallops auscultated  Respiratory: lungs clear to auscultation bilaterally Abdomen: soft, nontender, presence of bowel sounds Extremities: no LE edema, radial and dorsalis pedis pulses strong and equal bilaterally Neuro: AOx2, alert and cooperative Psych: mood appropriate, pleasant   Laboratory: Recent Labs  Lab 08/15/20 0321 08/16/20 0328 08/18/20 0411  WBC 7.7 9.0 7.4  HGB 9.7* 9.3* 9.3*  HCT 28.7* 27.5* 28.4*  PLT 259 255 288   Recent Labs  Lab 08/16/20 0328 08/17/20 0323 08/18/20 0411 08/20/20 0159 08/21/20 0204  NA 137 136 135 134* 134*  K 3.7 4.6 3.9 4.0 4.1  CL 109 110 105 100 98  CO2 18* 19* 23 24 25   BUN 12 10 8 11 16   CREATININE 0.60* 0.53* 0.55* 0.68 0.71  CALCIUM 8.5* 8.6* 8.5* 8.6* 8.6*  PROT 6.0* 5.6* 5.6*  --   --   BILITOT 0.3 0.6 0.5  --   --   ALKPHOS 100 93 93  --   --   ALT 59* 47* 41  --   --   AST 90* 49*  42*  --   --   GLUCOSE 92 87 90 90 87      Imaging/Diagnostic Tests: No results found.  Donney Dice, DO 08/21/2020, 6:41 AM PGY-1, [place] Intern pager: [phone], text pages welcome

## 2020-08-22 ENCOUNTER — Other Ambulatory Visit: Payer: Self-pay

## 2020-08-22 DIAGNOSIS — E43 Unspecified severe protein-calorie malnutrition: Secondary | ICD-10-CM | POA: Diagnosis not present

## 2020-08-22 DIAGNOSIS — I4891 Unspecified atrial fibrillation: Secondary | ICD-10-CM | POA: Diagnosis not present

## 2020-08-22 DIAGNOSIS — F1020 Alcohol dependence, uncomplicated: Secondary | ICD-10-CM | POA: Diagnosis not present

## 2020-08-22 DIAGNOSIS — Z659 Problem related to unspecified psychosocial circumstances: Secondary | ICD-10-CM | POA: Diagnosis not present

## 2020-08-22 LAB — BASIC METABOLIC PANEL
Anion gap: 9 (ref 5–15)
BUN: 18 mg/dL (ref 8–23)
CO2: 25 mmol/L (ref 22–32)
Calcium: 8.8 mg/dL — ABNORMAL LOW (ref 8.9–10.3)
Chloride: 102 mmol/L (ref 98–111)
Creatinine, Ser: 0.73 mg/dL (ref 0.61–1.24)
GFR, Estimated: >60 mL/min (ref >60)
Glucose, Bld: 97 mg/dL (ref 70–99)
Potassium: 4 mmol/L (ref 3.5–5.1)
Sodium: 136 mmol/L (ref 135–145)

## 2020-08-22 LAB — MAGNESIUM: Magnesium: 1.5 mg/dL — ABNORMAL LOW (ref 1.7–2.4)

## 2020-08-22 MED ORDER — MAGNESIUM SULFATE 2 GM/50ML IV SOLN
2.0000 g | Freq: Once | INTRAVENOUS | Status: AC
  Administered 2020-08-22: 2 g via INTRAVENOUS
  Filled 2020-08-22: qty 50

## 2020-08-22 NOTE — Progress Notes (Addendum)
Physical Therapy Treatment Patient Details Name: Anthony [name] MRN: [id number] DOB: 10-12-1955 Today's Date: 08/22/2020    History of Present Illness Pt is a 65 y.o. male admitted 08/13/20 after being found by social worker in his hotel room naked, incontinent of urine/feces with AMS; EMS noted tachycardia and hypotension. Workup for urosepsis, afib with RVR. CT/MRI negative for acute intracranial abnormality. PMH includes afib, ETOH use, HTN, HF, chronic back pain, anxiety, depression.   PT Comments    Pt progressing with mobility; requires encouragement to participate, reports limited by fatigue after bowel incontinence earlier today. Pt remains limited by higher level balance and cognitive impairments, including decreased awareness and impaired attention. Pt has demonstrated he cannot care for himself and would benefit from long-term supervised setting. Will continue to follow acutely to address established goals.   Follow Up Recommendations  SNF;Supervision - Intermittent     Equipment Recommendations   (TBD - pt declining use of DME; may benefit from rollator if willing to trial)    Recommendations for Other Services       Precautions / Restrictions Precautions Precautions: Fall;Other (comment) Precaution Comments: Incontinence Restrictions Weight Bearing Restrictions: No    Mobility  Bed Mobility Overal bed mobility: Modified Independent Bed Mobility: Supine to Sit;Sit to Supine              Transfers Overall transfer level: Needs assistance Equipment used: None Transfers: Sit to/from Stand Sit to Stand: Supervision            Ambulation/Gait Ambulation/Gait assistance: Min guard Gait Distance (Feet): 600 Feet Assistive device: IV Pole;None Gait Pattern/deviations: Step-through pattern;Decreased stride length;Drifts right/left;Narrow base of support     General Gait Details: Slow, mostly steady gait initially pushing IV pole, progressing to no UE support;  min guard for balance due to fall risk; 2x bouts self-corrected instability, no LOB. No DOE noted this session   Stairs             Wheelchair Mobility    Modified Rankin (Stroke Patients Only)       Balance Overall balance assessment: Needs assistance Sitting-balance support: Feet supported;No upper extremity supported Sitting balance-Leahy Scale: Good     Standing balance support: During functional activity;No upper extremity supported Standing balance-Leahy Scale: Fair                              Cognition Arousal/Alertness: Awake/alert Behavior During Therapy: WFL for tasks assessed/performed Overall Cognitive Status: No family/caregiver present to determine baseline cognitive functioning Area of Impairment: Orientation;Attention;Memory;Following commands;Safety/judgement;Awareness;Problem solving                 Orientation Level: Disoriented to;Time;Situation Current Attention Level: Selective Memory: Decreased recall of precautions;Decreased short-term memory Following Commands: Follows one step commands consistently;Follows multi-step commands inconsistently Safety/Judgement: Decreased awareness of safety;Decreased awareness of deficits Awareness: Emergent Problem Solving: Slow processing;Requires verbal cues General Comments: required redirection, easily distracted, poor insight      Exercises Other Exercises Other Exercises: Pt declined due to fatigue post-ambulation    General Comments        Pertinent Vitals/Pain Pain Assessment: No/denies pain    Home Living                      Prior Function            PT Goals (current goals can now be found in the care plan section) Progress towards PT goals:  Progressing toward goals    Frequency    Min 2X/week      PT Plan Frequency needs to be updated    Co-evaluation              AM-PAC PT "6 Clicks" Mobility   Outcome Measure  Help needed turning  from your back to your side while in a flat bed without using bedrails?: None Help needed moving from lying on your back to sitting on the side of a flat bed without using bedrails?: None Help needed moving to and from a bed to a chair (including a wheelchair)?: A Schiele Help needed standing up from a chair using your arms (e.g., wheelchair or bedside chair)?: A Carbon Help needed to walk in hospital room?: A Beste Help needed climbing 3-5 steps with a railing? : A Glorioso 6 Click Score: 20    End of Session Equipment Utilized During Treatment: Gait belt Activity Tolerance: Patient tolerated treatment well;Patient limited by fatigue Patient left: in bed;with call bell/phone within reach;with bed alarm set (declined chair) Nurse Communication: Mobility status PT Visit Diagnosis: Unsteadiness on feet (R26.81)     Time: 5465-0354 PT Time Calculation (min) (ACUTE ONLY): 18 min  Charges:  $Gait Training: 8-22 mins                    Mabeline Caras, PT, DPT Acute Rehabilitation Services  Pager [phone] Office [place] 08/22/2020, 3:19 PM

## 2020-08-22 NOTE — Progress Notes (Signed)
Family Medicine Teaching Service Daily Progress Note Intern Pager: [phone]  Patient name: Savannah Erbe Kraker Medical record number: [id number] Date of birth: April 15, 1956 Age: 65 y.o. Gender: male  Primary Care Provider: Carollee Leitz, MD Consultants: Cardiology  Code Status: Full  Pt Overview and Major Events to Date:  1/25 Admitted   Assessment and Plan: Jaque A Littleis a 65 y.o.malewho presents with urosepsis and A Fib in RVRdue to medication non-adherence. 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 Medically stable for discharge to SNF.  A Fib Previously in RVR, now resolved.Rate well controlled at HE 77.Cardiology previously following, has signed off. - metoprolol succinate 25 mg - PO amiodarone 200 mg BID, plan to change to amiodarone 200 mg daily after 2/4 - rivaroxaban 20 mg  Encephalopathy MRI unremarkable, no acute findings and no evidence of NPH.AOx3, unable to be able to report the reason for his hospitalization.   Hypomagnesemia Mg 1.5 this morning. Has been a chronic issue since prior to this admission. - 2g IV Mg - magnesium oxide 400 mg BID - continue to monitor   Alcohol use disorder Not in withdrawal, CIWAs have been discontinued. -continuefolate - continue thiamine  History of HTN Hypotensive this morning 92/66, previously within normotensive range. - monitorBP - consider holding metoprolol if consistently hypotensive  History of depression & anxiety -continue sertraline 200 mg daily  Muscle cramps Resolved. Currently not reporting any pain or cramping. States that he gets these regular cramps occasionally. Possibly due to hypomagnesemia.  -continue magnesium oxide supplementation daily   Social concerns Spoke with sister yesterday who reported that patient has had a history of homelessness and was living in a low-income housing unit until August  2021. Further complicated by patient's extensive alcohol use history which has last housing did not allow. Upon last admission, disposition was an issue and patient discharge. He was staying in a motel and found in the motel with AMS covered in urine and feces.  -pending SNF placement   FEN/GI: dysphagia 3 diet  PPx: rivaroxaban    Status is: Inpatient   Dispo: pending SNF placement     Subjective:  No acute overnight events. Patient has no concerns at this time, states that he has been eating and sleeping well.   Objective: Temp:  [97.5 F (36.4 C)-98.5 F (36.9 C)] 98.1 F (36.7 C) (02/03 0451) Pulse Rate:  [77-80] 77 (02/03 0451) Resp:  [17-20] 18 (02/03 0451) BP: (87-101)/(64-66) 92/66 (02/03 0451) SpO2:  [98 %] 98 % (02/03 0451) Physical Exam: General: Patient sitting upright in bed eating breakfast, in no acute distress. Cardiovascular: RRR, no murmurs or gallops auscultated  Respiratory: lungs clear to auscultation bilaterally Abdomen: soft, non-tender, presence of active bowel sounds Extremities: no LE edema noted bilaterally, radial pulses strong and equal bilaterally Neuro: AOx3 Psych: mood appropriate, no agitation noted   Laboratory: Recent Labs  Lab 08/16/20 0328 08/18/20 0411  WBC 9.0 7.4  HGB 9.3* 9.3*  HCT 27.5* 28.4*  PLT 255 288   Recent Labs  Lab 08/16/20 0328 08/17/20 0323 08/18/20 0411 08/20/20 0159 08/21/20 0204 08/22/20 0120  NA 137 136 135 134* 134* 136  K 3.7 4.6 3.9 4.0 4.1 4.0  CL 109 110 105 100 98 102  CO2 18* 19* 23 24 25 25   BUN 12 10 8 11 16 18   CREATININE 0.60* 0.53* 0.55* 0.68 0.71 0.73  CALCIUM 8.5* 8.6* 8.5* 8.6* 8.6*  8.8*  PROT 6.0* 5.6* 5.6*  --   --   --   BILITOT 0.3 0.6 0.5  --   --   --   ALKPHOS 100 93 93  --   --   --   ALT 59* 47* 41  --   --   --   AST 90* 49* 42*  --   --   --   GLUCOSE 92 87 90 90 87 97      Imaging/Diagnostic Tests: No results found.  Donney Dice, DO 08/22/2020, 6:45 AM PGY-1,  [place] Intern pager: [phone], text pages welcome

## 2020-08-22 NOTE — Progress Notes (Signed)
Occupational Therapy Treatment Patient Details Name: Anthony [name] MRN: [id number] DOB: 1955/09/10 Today's Date: 08/22/2020    History of present illness Anthony [name] is a 65 y.o. male presenting with urosepsis and A Fib in RVR. 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 Patient found by social work in his motel room naked in urine and feces with mild confusion. Upon EMS arrival, he was noted to be tachycardic with HR 210 with soft BP,.   OT comments  Pt requiring encouragement to work with OT. Able to don and doff socks, backside gown with set up. Ambulated to bathroom with min guard assist pushing IV pole. Attempted to get pt to shower, but declined stating, "I have some business to take care of first." Updated frequency to 1 x a week.   Follow Up Recommendations  SNF    Equipment Recommendations  None recommended by OT    Recommendations for Other Services      Precautions / Restrictions Precautions Precautions: Fall Precaution Comments: incontinent       Mobility Bed Mobility Overal bed mobility: Modified Independent                Transfers Overall transfer level: Needs assistance Equipment used:  (pushed IV pole) Transfers: Sit to/from Stand Sit to Stand: Supervision         General transfer comment: from bed and toilet    Balance Overall balance assessment: Needs assistance   Sitting balance-Leahy Scale: Good     Standing balance support: During functional activity Standing balance-Leahy Scale: Fair                             ADL either performed or assessed with clinical judgement   ADL Overall ADL's : Needs assistance/impaired Eating/Feeding: Independent;Sitting   Grooming: Wash/dry hands;Standing;Supervision/safety           Upper Body Dressing : Set up;Sitting Upper Body Dressing Details (indicate cue type and reason): front opening  gown Lower Body Dressing: Set up;Sitting/lateral leans Lower Body Dressing Details (indicate cue type and reason): socks Toilet Transfer: Min guard;Ambulation   Toileting- Clothing Manipulation and Hygiene: Min guard;Sit to/from stand       Functional mobility during ADLs: Min guard (pushing IV pole)       Vision       Perception     Praxis      Cognition Arousal/Alertness: Awake/alert Behavior During Therapy: WFL for tasks assessed/performed Overall Cognitive Status: No family/caregiver present to determine baseline cognitive functioning Area of Impairment: Orientation;Attention;Memory;Following commands;Safety/judgement;Awareness;Problem solving                 Orientation Level: Disoriented to;Time;Situation Current Attention Level: Selective Memory: Decreased recall of precautions;Decreased short-term memory Following Commands: Follows multi-step commands inconsistently Safety/Judgement: Decreased awareness of safety;Decreased awareness of deficits   Problem Solving: Slow processing;Requires verbal cues General Comments: required redirection, easily distracted, poor insight        Exercises     Shoulder Instructions       General Comments      Pertinent Vitals/ Pain       Pain Assessment: No/denies pain  Home Living  Prior Functioning/Environment              Frequency  Min 1X/week        Progress Toward Goals  OT Goals(current goals can now be found in the care plan section)  Progress towards OT goals: Progressing toward goals  Acute Rehab OT Goals Patient Stated Goal: to go to the barber shop OT Goal Formulation: With patient Time For Goal Achievement: 09/05/20 Potential to Achieve Goals: Good  Plan Discharge plan remains appropriate;Frequency needs to be updated    Co-evaluation                 AM-PAC OT "6 Clicks" Daily Activity     Outcome Measure   Help  from another person eating meals?: None Help from another person taking care of personal grooming?: A Sloss Help from another person toileting, which includes using toliet, bedpan, or urinal?: A Evett Help from another person bathing (including washing, rinsing, drying)?: A [place]clucas Help from another person to put on and taking off regular upper body clothing?: None Help from another person to put on and taking off regular lower body clothing?: None 6 Click Score: 21    End of Session Equipment Utilized During Treatment: Gait belt  OT Visit Diagnosis: Unsteadiness on feet (R26.81);Other symptoms and signs involving cognitive function   Activity Tolerance Patient tolerated treatment well   Patient Left in bed;with call bell/phone within reach;with bed alarm set   Nurse Communication          Time: 3338-3291 OT Time Calculation (min): 18 min  Charges: OT General Charges $OT Visit: 1 Visit OT Treatments $Self Care/Home Management : 8-22 mins  Nestor Lewandowsky, OTR/L Acute Rehabilitation Services Pager: [phone] Office: [phone]   Malka So 08/22/2020, 10:16 AM

## 2020-08-23 DIAGNOSIS — I4891 Unspecified atrial fibrillation: Secondary | ICD-10-CM | POA: Diagnosis not present

## 2020-08-23 DIAGNOSIS — F1020 Alcohol dependence, uncomplicated: Secondary | ICD-10-CM | POA: Diagnosis not present

## 2020-08-23 DIAGNOSIS — Z659 Problem related to unspecified psychosocial circumstances: Secondary | ICD-10-CM | POA: Diagnosis not present

## 2020-08-23 MED ORDER — MAGNESIUM CHLORIDE 64 MG PO TBEC
1.0000 | DELAYED_RELEASE_TABLET | Freq: Two times a day (BID) | ORAL | Status: DC
  Administered 2020-08-24: 64 mg via ORAL
  Filled 2020-08-23 (×2): qty 1

## 2020-08-23 MED ORDER — MAGNESIUM SULFATE 2 GM/50ML IV SOLN
2.0000 g | Freq: Once | INTRAVENOUS | Status: AC
  Administered 2020-08-23: 2 g via INTRAVENOUS
  Filled 2020-08-23: qty 50

## 2020-08-23 NOTE — Progress Notes (Signed)
CALL PAGER [phone] for any questions or notifications regarding this patient  FMTS Attending Note: Anthony Mcmurray MD Patient experiencing explosive diarrhea. Continues to have magnesium in 1.5 range and will need continued replacement. Willl switch to slow magnesium (extended release) starting tomorrow and give IV dose today. I suspect his hypomagnesia is related to long standing dietary issues and alcohol use disorder, so repletion make take a while. As he has started experiencing diarrhea, will swict type of oral replacement and give IV dose today. If he continues to have diarrhea, we may need to switch to IV replacement for a day or two. Ultimately I would recommend slow release form of replacement on discharge. We will have to see what dosage he will tolerate. Continue to check mg levels qd or QOD while inpatient. He is asymptomatic from hypomagnesia at this time.

## 2020-08-23 NOTE — Progress Notes (Signed)
FPTS Interim Progress Note  Contacted social work regarding patient's current status regarding SNF placement. Spoke with Anthony [name] who shares that patient has no bed offer at this time. It is unclear on whether patient has outstanding legal issues, if so this could hinder his SNF placement depending on the severity. SW is unaware of this but will look into it and determine if he is still a likely candidate for SNF placement soon given this new information. Appreciate continued involvement and assistance of [place], [place], DO 08/23/2020, 11:24 AM PGY-1, [place] Medicine Service pager [phone]

## 2020-08-23 NOTE — TOC Progression Note (Addendum)
Transition of Care [facility]) - Progression Note    Patient Details  Name: Anthony [name] MRN: [id number] Date of Birth: 05-Jan-1956  Transition of Care [facility]) CM/SW [place], [place] Phone Number: 08/23/2020, 12:49 PM  Clinical Narrative:     CSW spoke to pts niece [name] and informed her that has no bed offers. [name] expressed concern and frustration with this process. [name] is worried about pt being discharged back to a hotel when he cant care for himself properly. CSW inquired about moving in with family, Trinda Pascal stated that is not an option. [name] reports pt has a long hx of alcohol use, pt has a few DUI's. And was evicted from his apartment last year. [name] unaware of any other criminal charges.   CSW reached out to admission coordinator for [facility] to inquire about bed offer, they will review and let csw know of decision.   CSW reached out to [place] at [phone], left message requesting call back.   1:30pm Pts brother [name] called back and confirmed that they have no family that is willing to take him in due to "burning bridges due to being an alcoholic". [name] states him and [name] have discussed POA and is seeking guidance on how to move forward with that.  [name] stated pt has a warrant for his arrest in [place] for missed court date, unaware of original charge.   Expected Discharge Plan: [place] Barriers to Discharge: Continued Medical Work up  Expected Discharge Plan and Services Expected Discharge Plan: [place] arrangements for the past 2 months: Hotel/Motel                                       Social Determinants of Health (SDOH) Interventions    Readmission Risk Interventions No flowsheet data found.  Emeterio Reeve, Latanya Presser, [place] Social Worker [phone]

## 2020-08-23 NOTE — Progress Notes (Addendum)
Family Medicine Teaching Service Daily Progress Note Intern Pager: [phone]  Patient name: Anthony [name] Medical record number: [id number] Date of birth: 24-Aug-1955 Age: 65 y.o. Gender: male  Primary Care Provider: Carollee Leitz, MD Consultants: Cardiology  Code Status: Full  Pt Overview and Major Events to Date:  1/25 Admitted   Assessment and Plan: Stevenson [name] Anthony [name] 65 y.o.malewho presents with urosepsis and [name] Fib in RVRdue to medication non-adherence. 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 Medically stable for discharge to SNF.  [name] Fib Previously in RVR, now resolved.Rate well controlled at HR 90.Cardiology previously following, has signed off. - metoprolol succinate 25 mg - PO amiodarone 200 mg BID, plan to change to amiodarone 200 mg daily after 2/4 - rivaroxaban 20 mg  Encephalopathy MRI unremarkable, no acute findings and no evidence of NPH.AOx3, unable to be able to report the reason for his hospitalization.  Hypomagnesemia Mg 1.5 most recently on 2/3. Has been [name] chronic issue since prior to this admission. - 2g IV Mg - magnesium oxide 400 mg BID - continue to monitorwith Mg qod  Alcohol use disorder Not in withdrawal, CIWAs have been discontinued. -continuefolate - continue thiamine  History of HTN Normotensive this morning BP 115/70. - monitorBP - consider holding metoprolol if consistently hypotensive  History of depression & anxiety -continue sertraline 200 mg daily  Social concerns Spoke with sister previously who reported that patient has had [name] history of homelessness and was living in [name] low-income housing unit until August 2021. Further complicated by patient's extensive alcohol use history which has last housing did not allow. Upon last admission, disposition was an issue and patient discharge. He was staying in [name] motel and found in the motel  with AMS covered in urine and feces.  -pending SNF placement    FEN/GI: dysphagia 3 diet  PPx: rivaroxaban   Status is: Inpatient    Dispo: pending SNF placement     Subjective:  No significant overnight events.   Objective: Temp:  [97.4 F (36.3 C)-98 F (36.7 C)] 98 F (36.7 C) (02/03 1953) Pulse Rate:  [90-93] 90 (02/03 1953) Resp:  [18] 18 (02/03 1953) BP: (114-115)/(70-72) 115/70 (02/03 1953) SpO2:  [99 %] 99 % (02/03 1953) Physical Exam: General: Patient sitting upright in bed eating breakfast, in no acute distress. Cardiovascular: RRR, no murmurs or gallops Respiratory: lungs clear to auscultation bilaterally Abdomen: soft, non-tender, presence of active bowel sounds Extremities: no LE edema noted bilaterally, radial and dorsalis pedis pulses strong and equal bilaterally  Derm: skin warm and dry to touch, no new rashes or lesions noted  Neuro: AOx3, follows commands appropriately  Psych: mood appropriate   Laboratory: Recent Labs  Lab 08/18/20 0411  WBC 7.4  HGB 9.3*  HCT 28.4*  PLT 288   Recent Labs  Lab 08/17/20 0323 08/18/20 0411 08/20/20 0159 08/21/20 0204 08/22/20 0120  NA 136 135 134* 134* 136  K 4.6 3.9 4.0 4.1 4.0  CL 110 105 100 98 102  CO2 19* 23 24 25 25   BUN 10 8 11 16 18   CREATININE 0.53* 0.55* 0.68 0.71 0.73  CALCIUM 8.6* 8.5* 8.6* 8.6* 8.8*  PROT 5.6* 5.6*  --   --   --   BILITOT 0.6 0.5  --   --   --   ALKPHOS 93 93  --   --   --   ALT 47* 41  --   --   --  AST 49* 42*  --   --   --   GLUCOSE 87 90 90 87 97      Imaging/Diagnostic Tests: No results found.  Donney Dice, DO 08/23/2020, 5:59 AM PGY-1, [place] Intern pager: [phone], text pages welcome

## 2020-08-24 DIAGNOSIS — E43 Unspecified severe protein-calorie malnutrition: Secondary | ICD-10-CM

## 2020-08-24 DIAGNOSIS — I4891 Unspecified atrial fibrillation: Secondary | ICD-10-CM | POA: Diagnosis not present

## 2020-08-24 DIAGNOSIS — Z659 Problem related to unspecified psychosocial circumstances: Secondary | ICD-10-CM | POA: Diagnosis not present

## 2020-08-24 DIAGNOSIS — F1020 Alcohol dependence, uncomplicated: Secondary | ICD-10-CM | POA: Diagnosis not present

## 2020-08-24 LAB — MAGNESIUM: Magnesium: 1.6 mg/dL — ABNORMAL LOW (ref 1.7–2.4)

## 2020-08-24 LAB — BASIC METABOLIC PANEL
Anion gap: 11 (ref 5–15)
BUN: 14 mg/dL (ref 8–23)
CO2: 21 mmol/L — ABNORMAL LOW (ref 22–32)
Calcium: 8.9 mg/dL (ref 8.9–10.3)
Chloride: 99 mmol/L (ref 98–111)
Creatinine, Ser: 0.71 mg/dL (ref 0.61–1.24)
GFR, Estimated: >60 mL/min (ref >60)
Glucose, Bld: 95 mg/dL (ref 70–99)
Potassium: 4.5 mmol/L (ref 3.5–5.1)
Sodium: 131 mmol/L — ABNORMAL LOW (ref 135–145)

## 2020-08-24 MED ORDER — MAGNESIUM CHLORIDE 64 MG PO TBEC
1.0000 | DELAYED_RELEASE_TABLET | Freq: Every day | ORAL | Status: DC
  Administered 2020-08-25 – 2020-10-09 (×45): 64 mg via ORAL
  Filled 2020-08-24 (×46): qty 1

## 2020-08-24 NOTE — Progress Notes (Signed)
May be difficult to place as he has some outstanding legal issues in [place]. Not sure if this willimpact his ability to get a bed in [place].

## 2020-08-24 NOTE — Progress Notes (Addendum)
Family Medicine Teaching Service Daily Progress Note Intern Pager: [phone]  Patient name: Anthony [name] Medical record number: [id number] Date of birth: 1955/08/21 Age: 65 y.o. Gender: male  Primary Care Provider: Carollee Leitz, MD Consultants: Cardiology  Code Status: Full  Pt Overview and Major Events to Date:  1/25 Admitted   Assessment and Plan: Anthony A Littleis a 65 y.o.malewho presents with urosepsis and A Fib in RVRdue to medication non-adherence. 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 Medically stable for discharge to SNF.    Social concerns History of homelessness and was living in a low-income housing unit until August 2021. Further complicated by patient's extensive alcohol use history which has last housing did not allow. Upon last admission, disposition was an issue and patient discharge. He was staying in a motel and found in the motel with AMS covered in urine and feces. Per SW, it is not an option that family takes him. Legal issues along with extensive history of alcohol use further complicates this process. [facility]  -pending SNF placement  A Fib Previously in RVR, now resolved.Rate well controlledat HR 76.Cardiology previously following, has signed off. - metoprolol succinate 25 mg - amiodarone 200 mg daily - rivaroxaban 20 mg  Encephalopathy MRI unremarkable, no acute findings and no evidence of NPH.AOx3, unable to be able to report the reason for his hospitalization.  Hypomagnesemia Mg 1.6 this morning. Has been a chronic issue since prior to this admission.s/p 2g IV Mg on 2/4. Switched from magnesium oxide to extended release magnesium chloride due to multiple episodes of diarrhea. - magnesium chloride 64 mg bid  - continue to monitorwith Mg qod  Hyponatremia Na 131, patient does not appear to exhibit fluid  overload on exam. Maintaining appropriate hydration. Expected to resolve.  -monitor BMP  Alcohol use disorder Not in withdrawal, CIWAs have been discontinued. -continuefolate - continue thiamine  History of HTN Normotensive this morning BP 101/73. - monitorBP - consider holding metoprolol if consistently hypotensive  History of depression & anxiety -continue sertraline 200 mg daily  Chronic back pain Complaining of lower back pain that is chronic, endorses experiencing more pain yesterday afternoon. Given tylenol which helped the pain, essentially resolved. -monitor for symptoms -consider tylenol if arises again    FEN/GI: dysphagia 3 diet  PPx: rivaroxaban   Status is: Inpatient   Dispo: pending SNF placement       Subjective:  No acute overnight events. Endorsing one BM that was consistent with diarrhea over the past 12 hours, admits to improving prior diarrhea. Denies any pain this morning but had back pain that he gets occasionally yesterday afternoon. Shares that he was previously living in a public housing facility in [place] but unsure of where he was living prior to this hospital stay.   Objective: Temp:  [98 F (36.7 C)-98.4 F (36.9 C)] 98.4 F (36.9 C) (02/05 0609) Pulse Rate:  [75-84] 76 (02/05 0609) Resp:  [16-19] 16 (02/05 0609) BP: (98-113)/(54-85) 101/73 (02/05 0609) SpO2:  [98 %-100 %] 98 % (02/05 0609) Physical Exam: General: Patient sitting upright in bed eating breakfast, in no acute distress. Cardiovascular: RRR, no murmurs or gallops auscultated  Respiratory: lungs clear to auscultation bilaterally, no rales or rhonchi noted  Abdomen: soft, nontender, presence of active bowel sounds, no evidence of ascites noted  Extremities: no LE edema noted bilaterally, radial and dorsalis pedis  pulses strong and equal bilaterally  Neuro: AOx3, appropriately conversational Psych: mood appropriate, pleasant   Laboratory: Recent Labs  Lab  08/18/20 0411  WBC 7.4  HGB 9.3*  HCT 28.4*  PLT 288   Recent Labs  Lab 08/18/20 0411 08/20/20 0159 08/21/20 0204 08/22/20 0120 08/24/20 0329  NA 135   < > 134* 136 131*  K 3.9   < > 4.1 4.0 4.5  CL 105   < > 98 102 99  CO2 23   < > 25 25 21*  BUN 8   < > 16 18 14   CREATININE 0.55*   < > 0.71 0.73 0.71  CALCIUM 8.5*   < > 8.6* 8.8* 8.9  PROT 5.6*  --   --   --   --   BILITOT 0.5  --   --   --   --   ALKPHOS 93  --   --   --   --   ALT 41  --   --   --   --   AST 42*  --   --   --   --   GLUCOSE 90   < > 87 97 95   < > = values in this interval not displayed.      Imaging/Diagnostic Tests: No results found.  Donney Dice, DO 08/24/2020, 6:35 AM PGY-1, [place] Intern pager: [phone], text pages welcome

## 2020-08-24 NOTE — Progress Notes (Addendum)
Family Medicine Teaching Service Daily Progress Note Intern Pager: [phone]  Patient name: Korben Carcione Blasdel Medical record number: [id number] Date of birth: Aug 14, 1955 Age: 65 y.o. Gender: male  Primary Care Provider: Carollee Leitz, MD Consultants: Cardiology  Code Status: Full  Pt Overview and Major Events to Date:  1/25 Admitted   Assessment and Plan: Rose A Littleis a 65 y.o.malewho presents with urosepsis and A Fib in RVRdue to medication non-adherence. 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 Medically stable for discharge to SNF.    Social concerns  Difficult disposition:  No longer option of staying with family or hotel as he had previously.  Extensive history of alcohol use and current legal issues complicating placement.  Social work looking at possible bed at [facility].  -Pending SNF placement, appreciate SW   A Fib: Acute RVR resolved, now chronic and stable.  Rate controlled HR 70s. - metoprolol succinate 25 mg (consider decrease if BP lowers)  - amiodarone 200 mg daily - rivaroxaban 20 mg -Appreciated cardiology recommendations earlier in hospital stay  Previous Toxic/metabolic Encephalopathy, in setting of urosepsis: Resolved.  Completed antibiotic treatment.  MRI brain unremarkable.  Mentating now at baseline, alert and oriented. -Continue to monitor mental status  Hypomagnesemia: Recurrent. Mag 1.6 on 2/5, recurrently low despite replacement.  Recently transitioned to magnesium chloride as could not tolerate previous oral supplement. - magnesium chloride 64 mg bid  - continue to monitorwith Mg qod (next 2/7)  Alcohol use disorder: Chronic, stable.  Not in withdrawal, CIWAs have been discontinued. -continuefolate - continue thiamine  Hypertension: Well controlled, Chronic.  Normotensive, MAP >65.  - monitorBP -Consider decreasing metoprolol dose to 12.5 if blood  pressure continually lower  MDD  Anxiety: Chronic, stable.  -continue sertraline 200 mg daily  Acute on Chronic back pain: Stable.  Likely due to deconditioning and minimal movement recently.  Neurologically intact without any concerning s/sx. -Tylenol as needed -K pad -monitor for symptoms  FEN/GI: dysphagia 3 diet  PPx: rivaroxaban   Status is: Inpatient  Dispo: pending SNF placement, medically stable for discharge  Subjective:  No acute events overnight.  Reports he intermittently has low back pain due to the beds.  He hasn't been walking or moving around as much recently.  Denies any associated weakness, numbness, bowel/bladder dysfunction.  Has not tried any heat on his back yet.  Objective: Temp:  [98.1 F (36.7 C)-98.4 F (36.9 C)] 98.3 F (36.8 C) (02/05 2053) Pulse Rate:  [76] 76 (02/05 2053) Resp:  [16-17] 17 (02/05 2053) BP: (90-101)/(53-73) 90/53 (02/05 2053) SpO2:  [97 %-98 %] 97 % (02/05 2053) Physical Exam: General: Alert, NAD HEENT: NCAT, MMM, oropharynx nonerythematous  Cardiac: RRR Lungs: Clear bilaterally, no increased WOB  Abdomen: soft, non-tender Msk: Moves all extremities spontaneously, 5/5 lower extremity strength bilaterally.  Sensation to light touch intact throughout.  Nontender to palpation of lumbar spinous processes, some tenderness to bilateral paraspinal lumbar musculature. Ext: Warm, dry, 2+ distal pulses   Laboratory: Recent Labs  Lab 08/18/20 0411  WBC 7.4  HGB 9.3*  HCT 28.4*  PLT 288   Recent Labs  Lab 08/18/20 0411 08/20/20 0159 08/21/20 0204 08/22/20 0120 08/24/20 0329  NA 135   < > 134* 136 131*  K 3.9   < > 4.1 4.0 4.5  CL 105   < > 98 102 99  CO2 23   < > 25 25 21*  BUN 8   < > 16 18 14   CREATININE 0.55*   < > 0.71 0.73 0.71  CALCIUM 8.5*   < > 8.6* 8.8* 8.9  PROT 5.6*  --   --   --   --   BILITOT 0.5  --   --   --   --   ALKPHOS 93  --   --   --   --   ALT 41  --   --   --   --   AST 42*  --   --   --    --   GLUCOSE 90   < > 87 97 95   < > = values in this interval not displayed.    Imaging/Diagnostic Tests: No results found.  [name], DO 08/24/2020, 9:46 PM PGY-3, [place] Intern pager: [phone], text pages welcome

## 2020-08-25 DIAGNOSIS — I4891 Unspecified atrial fibrillation: Secondary | ICD-10-CM | POA: Diagnosis not present

## 2020-08-25 DIAGNOSIS — F1020 Alcohol dependence, uncomplicated: Secondary | ICD-10-CM | POA: Diagnosis not present

## 2020-08-25 DIAGNOSIS — N390 Urinary tract infection, site not specified: Secondary | ICD-10-CM | POA: Diagnosis not present

## 2020-08-26 DIAGNOSIS — I4891 Unspecified atrial fibrillation: Secondary | ICD-10-CM | POA: Diagnosis not present

## 2020-08-26 LAB — BASIC METABOLIC PANEL
Anion gap: 8 (ref 5–15)
BUN: 19 mg/dL (ref 8–23)
CO2: 24 mmol/L (ref 22–32)
Calcium: 9.3 mg/dL (ref 8.9–10.3)
Chloride: 104 mmol/L (ref 98–111)
Creatinine, Ser: 0.93 mg/dL (ref 0.61–1.24)
GFR, Estimated: >60 mL/min (ref >60)
Glucose, Bld: 100 mg/dL — ABNORMAL HIGH (ref 70–99)
Potassium: 3.8 mmol/L (ref 3.5–5.1)
Sodium: 136 mmol/L (ref 135–145)

## 2020-08-26 LAB — MAGNESIUM: Magnesium: 1.6 mg/dL — ABNORMAL LOW (ref 1.7–2.4)

## 2020-08-26 NOTE — Progress Notes (Signed)
Family Medicine Teaching Service Daily Progress Note Intern Pager: [phone]  Patient name: Anthony [name] Medical record number: [id number] Date of birth: 1955-10-05 Age: 65 y.o. Gender: male  Primary Care Provider: Carollee Leitz, MD Consultants: Cardiology (s/o) Code Status: Full  Pt Overview and Major Events to Date:  1/25: admitted 1/29: medically stable for discharge  Assessment and Plan:  Aydrian A Littleis a 65 y.o.malewho presented with urosepsis and A-Fib w/RVRdue to medication non-adherence. 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 Medically stable for discharge to SNF.    Difficult Disposition Patient unable to properly care for himself and has no family willing to take him in. Prior legal issues have also been a barrier to placement. -Appreciate SW assistance with SNF placement  Recurrent Hypomagnesemia Mag 1.6 this morning (has remained 1.5-1.6 throughout entire admission despite repletion). -Magnesium chloride 64mg  daily -Monitor with QOD labs  A-Fib, rate-controlled Patient remains in A-fib with HR 70s. -metoprolol succinate 25mg  daily -amiodarone 200mg  daily -xarelto 20mg  daily  Hx of Hypertension Chronic, very well-controlled. Patient has been normotensive over past 24h. Most recent BP 97/67. Previously had issues with low BPs earlier this admission. -MonitorBP -Consider decreasing metoprolol dose to 12.5 if blood pressure continually lower  Urosepsis- resolved Urine cx grew ampicillin resistance E. Coli. Patient is s/p Keflex x5 days  Alcohol use disorder Chronic, stable. Not in withdrawal, CIWAs have been discontinued. -Daily thiamine and folic acid  MDD  Anxiety Chronic, stable. -Continue sertraline 200 mg daily  Chronic Back Pain Chronic, stable.  -Tylenol as needed -K pad   FEN/GI: Dysphagia 3 diet PPx: Xarelto   Status is: Inpatient Remains  inpatient appropriate because:Unsafe d/c plan and awaiting SNF   Dispo:  Patient From: Home  Planned Disposition: [facility]  Expected discharge date: 08/26/2020  Medically stable for discharge: Yes  Difficult to place patient: Yes   Subjective:  No acute events overnight. Patient denies complaints this morning. He is frustrated about his bed alarm because he'd like to be able to ambulate to the bathroom. He also would like assistance with personal care items such as trimming his fingernails.  Objective: Temp:  [98.1 F (36.7 C)-98.6 F (37 C)] 98.1 F (36.7 C) (02/06 2241) Pulse Rate:  [74-83] 74 (02/06 2241) Resp:  [16-18] 18 (02/06 2241) BP: (97-106)/(67) 97/67 (02/06 2241) SpO2:  [98 %-99 %] 98 % (02/06 2241) Physical Exam: General: alert, sitting up in bed comfortably Cardiovascular: irregularly irregular rhythm, normal S1/S2 Respiratory: normal WOB, lungs CTAB Abdomen: soft, nontender Extremities: no peripheral edema  Laboratory: No results for input(s): WBC, HGB, HCT, PLT in the last 168 hours. Recent Labs  Lab 08/22/20 0120 08/24/20 0329 08/26/20 0245  NA 136 131* 136  K 4.0 4.5 3.8  CL 102 99 104  CO2 25 21* 24  BUN 18 14 19   CREATININE 0.73 0.71 0.93  CALCIUM 8.8* 8.9 9.3  GLUCOSE 97 95 100*     Imaging/Diagnostic Tests: No new imaging/diagnostic tests   [name], MD 08/26/2020, 6:06 AM PGY-1, [place] Intern pager: [phone], text pages welcome

## 2020-08-27 DIAGNOSIS — I4891 Unspecified atrial fibrillation: Secondary | ICD-10-CM | POA: Diagnosis not present

## 2020-08-27 MED ORDER — LORAZEPAM 2 MG/ML IJ SOLN
1.0000 mg | Freq: Once | INTRAMUSCULAR | Status: AC
  Administered 2020-08-27: 1 mg via INTRAVENOUS
  Filled 2020-08-27: qty 1

## 2020-08-27 NOTE — Progress Notes (Signed)
PT Cancellation Note  Patient Details Name: Anthony [name] MRN: [id number] DOB: July 19, 1956   Cancelled Treatment:    Reason Eval/Treat Not Completed: (P) Fatigue/lethargy limiting ability to participate (Pt given dose of ativan will f/u when patient is more alert.)   Norleen Xie Eli Hose 08/27/2020, 1:10 PM  Erasmo Leventhal , PTA Acute Rehabilitation Services Pager [phone] Office [phone]

## 2020-08-27 NOTE — Progress Notes (Signed)
Nutrition Follow-up  DOCUMENTATION CODES:   Severe malnutrition in context of social or environmental circumstances  INTERVENTION:   -Continue Ensure Enlive po BID, each supplement provides 350 kcal and 20 grams of protein -Continue MVI with minerals daily -Magic cup BID with meals, each supplement provides 290 kcal and 9 grams of protein  NUTRITION DIAGNOSIS:   Severe Malnutrition related to social / environmental circumstances as evidenced by moderate fat depletion,severe fat depletion,moderate muscle depletion,severe muscle depletion.  Ongoing  GOAL:   Patient will meet greater than or equal to 90% of their needs  Progressing   MONITOR:   PO intake,Supplement acceptance,Labs,Weight trends,Skin,I & O's  REASON FOR ASSESSMENT:   Consult Assessment of nutrition requirement/status  ASSESSMENT:   Anthony [name] is a 65 y.o. male presenting with urosepsis and A Fib in RVR. 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  Reviewed I/O's: +84ml x 24 hours and -2.6 L since admission  UOP: 375 ml x 24 hours  Pt unavailable at time of visit.   Pt remains with good appetite. Noted meal completions 25-100%. He is intermittently accepting of Ensure supplements, refusing the last two doses.   Medications reviewed.   Per TOC notes, pt remains medically stable for discharge, however, continuing to wait on SNF placement.   Labs reviewed: CBGS: 87-100, Mg: 1.6 (on PO supplementation).  Diet Order:   Diet Order            DIET DYS 3 Room service appropriate? No; Fluid consistency: Thin  Diet effective now                 EDUCATION NEEDS:   No education needs have been identified at this time  Skin:  Skin Assessment: Skin Integrity Issues: Skin Integrity Issues:: Stage I Stage I: coccyx  Last BM:  08/27/20  Height:   Ht Readings from Last 1 Encounters:  08/20/20 5\' 8"  (1.727 m)     Weight:   Wt Readings from Last 1 Encounters:  08/20/20 61.9 kg    Ideal Body Weight:  70 kg  BMI:  Body mass index is 20.75 kg/m.  Estimated Nutritional Needs:   Kcal:  1900-2100  Protein:  105-120 grams  Fluid:  > 1.9 L    Loistine Chance, RD, LDN, [place] Registered Dietitian II Certified Diabetes Care and Education Specialist Please refer to [facility] for RD and/or RD on-call/weekend/after hours pager

## 2020-08-27 NOTE — Progress Notes (Signed)
Physical Therapy Treatment Patient Details Name: Anthony [name] MRN: [id number] DOB: [birth year] Today's Date: 08/27/2020    History of Present Illness Pt is a 65 y.o. male admitted 08/13/20 after being found by social worker in his hotel room naked, incontinent of urine/feces with AMS; EMS noted tachycardia and hypotension. Workup for urosepsis, afib with RVR. CT/MRI negative for acute intracranial abnormality. PMH includes afib, ETOH use, HTN, HF, chronic back pain, anxiety, depression.    PT Comments    Pt supine in bed on arrival.  He is able to perform bed mobility and transfers independently.  Poor safety awareness during trial of rollator.  Pt attempting to sit and pedal and at one point placing his foot on the seat of the rollator and required cues for safety to correct.  Pt continues to benefit from snf due to cognitive behaviors and need for long term placement.     Follow Up Recommendations  SNF;Supervision - Intermittent     Equipment Recommendations   (TBD)    Recommendations for Other Services       Precautions / Restrictions Precautions Precautions: Fall;Other (comment) Precaution Comments: Incontinence Restrictions Weight Bearing Restrictions: No    Mobility  Bed Mobility Overal bed mobility: Needs Assistance;Modified Independent       Supine to sit: Modified independent (Device/Increase time)        Transfers Overall transfer level: Modified independent Equipment used: None Transfers: Sit to/from Stand              Ambulation/Gait Ambulation/Gait assistance: Supervision Gait Distance (Feet): 350 Feet Assistive device: 4-wheeled walker Gait Pattern/deviations: Step-through pattern;Decreased stride length;Drifts right/left;Narrow base of support     General Gait Details: Pt mildly unsteady but no true LOB, utilized rollator but feel he will not use it correctly at d/c.  Will need more practice without device as balance is more impaired without  device.  Easily distracted trying to enter other rooms and stopping to talk with nursing staff.   Stairs             Wheelchair Mobility    Modified Rankin (Stroke Patients Only)       Balance Overall balance assessment: Needs assistance Sitting-balance support: Feet supported;No upper extremity supported Sitting balance-Leahy Scale: Good Sitting balance - Comments: Limited sitting tolerance due to back pain.   Standing balance support: During functional activity;No upper extremity supported Standing balance-Leahy Scale: Fair                              Cognition Arousal/Alertness: Awake/alert Behavior During Therapy: WFL for tasks assessed/performed Overall Cognitive Status: No family/caregiver present to determine baseline cognitive functioning Area of Impairment: Orientation;Attention;Memory;Following commands;Safety/judgement;Awareness;Problem solving                 Orientation Level: Disoriented to;Time;Situation Current Attention Level: Selective Memory: Decreased recall of precautions;Decreased short-term memory Following Commands: Follows one step commands consistently;Follows multi-step commands inconsistently Safety/Judgement: Decreased awareness of safety;Decreased awareness of deficits Awareness: Emergent Problem Solving: Slow processing;Requires verbal cues General Comments: required redirection, easily distracted, poor insight, innappropriate at times with male staff, placing L foot on seat of rollator with very poor safety awareness using device.      Exercises      General Comments        Pertinent Vitals/Pain Pain Assessment: No/denies pain Faces Pain Scale: Hurts Busey more Pain Location: chronic back pain Pain Descriptors / Indicators: Sore Pain Intervention(s): Monitored during  session    Home Living                      Prior Function            PT Goals (current goals can now be found in the care  plan section) Acute Rehab PT Goals Patient Stated Goal: to play the banjo Potential to Achieve Goals: Good Progress towards PT goals: Progressing toward goals    Frequency    Min 2X/week      PT Plan Current plan remains appropriate    Co-evaluation              AM-PAC PT "6 Clicks" Mobility   Outcome Measure  Help needed turning from your back to your side while in a flat bed without using bedrails?: None Help needed moving from lying on your back to sitting on the side of a flat bed without using bedrails?: None Help needed moving to and from a bed to a chair (including a wheelchair)?: A [place] Help needed standing up from a chair using your arms (e.g., wheelchair or bedside chair)?: A [place] Help needed to walk in hospital room?: A [place] Help needed climbing 3-5 steps with a railing? : A [place] 6 Click Score: 20    End of Session Equipment Utilized During Treatment: Gait belt Activity Tolerance: Patient tolerated treatment well (very distracted.) Patient left: in bed;with call bell/phone within reach;with bed alarm set Nurse Communication: Mobility status PT Visit Diagnosis: Unsteadiness on feet (R26.81)     Time: 7290-2111 PT Time Calculation (min) (ACUTE ONLY): 14 min  Charges:  $Gait Training: 8-22 mins                     Erasmo Leventhal , PTA Acute Rehabilitation Services Pager [phone] Office [phone]     Rochell Mabie Eli Hose 08/27/2020, 4:49 PM

## 2020-08-27 NOTE — TOC Progression Note (Signed)
Transition of Care [facility]) - Progression Note    Patient Details  Name: BLAKELEY MARGRAF MRN: [id number] Date of Birth: [birth year]  Transition of Care [facility]) CM/SW [place], [place] Phone Number: 08/27/2020, 4:19 PM  Clinical Narrative:     Pt has no bed offers at this time. TOC will continue to follow for placement needs.   Expected Discharge Plan: [place] Barriers to Discharge: Continued Medical Work up  Expected Discharge Plan and Services Expected Discharge Plan: [place] arrangements for the past 2 months: Hotel/Motel                                       Social Determinants of Health (SDOH) Interventions    Readmission Risk Interventions No flowsheet data found.  Emeterio Reeve, Latanya Presser, [place] Social Worker [phone]

## 2020-08-27 NOTE — Progress Notes (Signed)
Family Medicine Teaching Service Daily Progress Note Intern Pager: [phone]  Patient name: Anthony [name] Medical record number: [id number] Date of birth: [birth year] Age: 65 y.o. Gender: male  Primary Care Provider: Carollee Leitz, MD Consultants: Cardiology (s/o) Code Status: Full  Pt Overview and Major Events to Date:  1/25: admitted 1/29: medically stable for discharge  Assessment and Plan:  Anthony [name] a 65 y.o.malewho presented with urosepsis and A-Fib w/RVRdue to medication non-adherence. 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 Medically stable for discharge to SNF.    Difficult Disposition Patient unable to properly care for himself and has no family willing to take him in. Prior legal issues have also been a barrier to placement. -Appreciate SW assistance with SNF placement -Will touch base with SW today regarding placement progress  Behavioral Concerns Nurse reported concerns that patient may have ingested the Listerine mouthwash that was in his room. This was not witnessed. Additionally, patient is becoming frustrated by his bed alarm and is wanting to leave the hospital. -Will have PT re-evaluate, can possibly d/c bed alarm pending PT eval -Will order tele-sitter for patient  Recurrent Hypomagnesemia Mag has remained 1.5-1.6 throughout entire admission despite repletion. -Magnesium chloride 64mg  daily -Monitor with QOD labs  A-Fib, rate-controlled Patient remains in A-fib with HR 70s. -metoprolol succinate 25mg  daily -amiodarone 200mg  daily -xarelto 20mg  daily  Hx of Hypertension Chronic, very well-controlled. Patient has been normotensive over past 24h. Most recent BP 105/71. Previously had issues with low BPs earlier this admission. -MonitorBP -Consider decreasing metoprolol dose to 12.5 if blood pressure continually lower  Urosepsis- resolved Urine cx grew  ampicillin resistance E. Coli. Patient is s/p Keflex x5 days  Alcohol use disorder Chronic, stable. Not in withdrawal, CIWAs have been discontinued. -Daily thiamine and folic acid  MDD  Anxiety Chronic, stable. -Continue sertraline 200 mg daily  Chronic Back Pain Chronic, stable.  -Tylenol as needed -K pad   FEN/GI: Dysphagia 3 diet PPx: Xarelto   Status is: Inpatient Remains inpatient appropriate because:Unsafe d/c plan and awaiting SNF   Dispo:  Patient From: Home  Planned Disposition: [place]  Expected discharge date: 08/27/2020  Medically stable for discharge: Yes  Difficult to place patient: Yes   Subjective:  No acute events overnight. Patient is frustrated and feels there's no plan for him while he's here in the hospital. He has some tangential speech and is difficult to redirect this morning.  Objective: Temp:  [98.1 F (36.7 C)-98.2 F (36.8 C)] 98.2 F (36.8 C) (02/07 2026) Pulse Rate:  [68-76] 72 (02/07 2026) Resp:  [16-18] 16 (02/07 2026) BP: (93-106)/(65-69) 106/69 (02/07 2026) SpO2:  [98 %] 98 % (02/07 2026) Physical Exam: General: alert, no acute distress Cardiovascular: irregularly irregular rhythm, normal S1/S2 Respiratory: normal WOB, lungs CTAB Abdomen: soft, nontender Extremities: no peripheral edema  Laboratory: No results for input(s): WBC, HGB, HCT, PLT in the last 168 hours. Recent Labs  Lab 08/22/20 0120 08/24/20 0329 08/26/20 0245  NA 136 131* 136  K 4.0 4.5 3.8  CL 102 99 104  CO2 25 21* 24  BUN 18 14 19   CREATININE 0.73 0.71 0.93  CALCIUM 8.8* 8.9 9.3  GLUCOSE 97 95 100*     Imaging/Diagnostic Tests: No new imaging/diagnostic tests   [name], MD 08/27/2020, 6:22 AM PGY-1, [place] Intern pager: [phone], text pages welcome

## 2020-08-27 NOTE — Progress Notes (Signed)
Sitter noticed that pt's personal large bottle of mouthwash was almost finished and the rest of the staff agreed that he only got it a couple days ago and that his talkative and inappropriate behavior is not normal. Pt was asked and he said "I've been brushing my teeth a lot" but denies drinking it. MD notified, one time dose of Ativan 1mg  IV given.

## 2020-08-28 DIAGNOSIS — Z659 Problem related to unspecified psychosocial circumstances: Secondary | ICD-10-CM | POA: Diagnosis not present

## 2020-08-28 DIAGNOSIS — I4891 Unspecified atrial fibrillation: Secondary | ICD-10-CM | POA: Diagnosis not present

## 2020-08-28 DIAGNOSIS — F1020 Alcohol dependence, uncomplicated: Secondary | ICD-10-CM | POA: Diagnosis not present

## 2020-08-28 DIAGNOSIS — E43 Unspecified severe protein-calorie malnutrition: Secondary | ICD-10-CM | POA: Diagnosis not present

## 2020-08-28 LAB — BASIC METABOLIC PANEL
Anion gap: 10 (ref 5–15)
BUN: 17 mg/dL (ref 8–23)
CO2: 23 mmol/L (ref 22–32)
Calcium: 9.4 mg/dL (ref 8.9–10.3)
Chloride: 106 mmol/L (ref 98–111)
Creatinine, Ser: 0.76 mg/dL (ref 0.61–1.24)
GFR, Estimated: >60 mL/min (ref >60)
Glucose, Bld: 87 mg/dL (ref 70–99)
Potassium: 4.3 mmol/L (ref 3.5–5.1)
Sodium: 139 mmol/L (ref 135–145)

## 2020-08-28 LAB — MAGNESIUM: Magnesium: 1.6 mg/dL — ABNORMAL LOW (ref 1.7–2.4)

## 2020-08-28 MED ORDER — THIAMINE HCL 100 MG PO TABS
100.0000 mg | ORAL_TABLET | Freq: Every day | ORAL | Status: DC
  Administered 2020-08-28 – 2020-10-09 (×43): 100 mg via ORAL
  Filled 2020-08-28 (×44): qty 1

## 2020-08-28 MED ORDER — CHLORHEXIDINE GLUCONATE 0.12 % MT SOLN
15.0000 mL | Freq: Two times a day (BID) | OROMUCOSAL | Status: DC
  Administered 2020-08-28 – 2020-10-09 (×65): 15 mL via OROMUCOSAL
  Filled 2020-08-28 (×68): qty 15

## 2020-08-28 MED ORDER — FOLIC ACID 1 MG PO TABS
1.0000 mg | ORAL_TABLET | Freq: Every day | ORAL | Status: DC
  Administered 2020-08-28 – 2020-10-09 (×43): 1 mg via ORAL
  Filled 2020-08-28 (×44): qty 1

## 2020-08-28 NOTE — Progress Notes (Signed)
Occupational Therapy Treatment Patient Details Name: Anthony [name] MRN: [id number] DOB: 1955/09/27 Today's Date: 08/28/2020    History of present illness Pt is a 65 y.o. male admitted 08/13/20 after being found by social worker in his hotel room naked, incontinent of urine/feces with AMS; EMS noted tachycardia and hypotension. Workup for urosepsis, afib with RVR. CT/MRI negative for acute intracranial abnormality. PMH includes afib, ETOH use, HTN, HF, chronic back pain, anxiety, depression.   OT comments  Pt making progress with functional goals. Pt with no c/o pain and was not inappropriate this session. OT will continue to follow acutely to maximize level of function and safety  Follow Up Recommendations  SNF    Equipment Recommendations  None recommended by OT    Recommendations for Other Services      Precautions / Restrictions Precautions Precautions: Fall;Other (comment) Precaution Comments: Incontinence Restrictions Weight Bearing Restrictions: No       Mobility Bed Mobility Overal bed mobility: Needs Assistance;Modified Independent       Supine to sit: Modified independent (Device/Increase time)        Transfers Overall transfer level: Modified independent Equipment used: None Transfers: Sit to/from Stand Sit to Stand: Supervision         General transfer comment: pt ambulated to bathroom, stood at sink, then to recliner    Balance Overall balance assessment: Needs assistance Sitting-balance support: Feet supported;No upper extremity supported Sitting balance-Leahy Scale: Good     Standing balance support: During functional activity;No upper extremity supported Standing balance-Leahy Scale: Fair                             ADL either performed or assessed with clinical judgement   ADL Overall ADL's : Needs assistance/impaired     Grooming: Wash/dry hands;Standing;Supervision/safety;Wash/dry face       Lower Body Bathing: Min  guard;Sit to/from stand;Cueing for safety Lower Body Bathing Details (indicate cue type and reason): simulated standing at sink in bathroom         Toilet Transfer: Supervision/safety;Ambulation   Toileting- Clothing Manipulation and Hygiene: Supervision/safety;Sit to/from stand       Functional mobility during ADLs: Supervision/safety       Vision Baseline Vision/History: Wears glasses Wears Glasses: Reading only Patient Visual Report: No change from baseline     Perception     Praxis      Cognition Arousal/Alertness: Awake/alert Behavior During Therapy: WFL for tasks assessed/performed Overall Cognitive Status: No family/caregiver present to determine baseline cognitive functioning Area of Impairment: Orientation;Attention;Memory;Following commands;Safety/judgement;Awareness;Problem solving                 Orientation Level: Disoriented to;Time;Situation   Memory: Decreased recall of precautions;Decreased short-term memory   Safety/Judgement: Decreased awareness of safety;Decreased awareness of deficits   Problem Solving: Slow processing;Requires verbal cues General Comments: required redirection, easily distracted, poor insight,  poor safety awareness        Exercises     Shoulder Instructions       General Comments      Pertinent Vitals/ Pain       Pain Assessment: No/denies pain Pain Score: 0-No pain Pain Intervention(s): Monitored during session  Home Living                                          Prior Functioning/Environment  Frequency  Min 1X/week        Progress Toward Goals  OT Goals(current goals can now be found in the care plan section)  Progress towards OT goals: Progressing toward goals     Plan Discharge plan remains appropriate    Co-evaluation                 AM-PAC OT "6 Clicks" Daily Activity     Outcome Measure   Help from another person eating meals?: None Help  from another person taking care of personal grooming?: A [place] Help from another person toileting, which includes using toliet, bedpan, or urinal?: A [place] Help from another person bathing (including washing, rinsing, drying)?: A [place] Help from another person to put on and taking off regular upper body clothing?: None Help from another person to put on and taking off regular lower body clothing?: A [place] 6 Click Score: 20    End of Session Equipment Utilized During Treatment: Gait belt  OT Visit Diagnosis: Unsteadiness on feet (R26.81);Other symptoms and signs involving cognitive function   Activity Tolerance Patient tolerated treatment well   Patient Left with call bell/phone within reach;in chair;with chair alarm set   Nurse Communication          Time: 1224-8250 OT Time Calculation (min): 20 min  Charges: OT General Charges $OT Visit: 1 Visit OT Treatments $Self Care/Home Management : 8-22 mins     Britt Bottom 08/28/2020, 2:40 PM

## 2020-08-28 NOTE — Progress Notes (Signed)
Family Medicine Teaching Service Daily Progress Note Intern Pager: [phone]  Patient name: Anthony [name] Medical record number: [id number] Date of birth: 1955/09/11 Age: 65 y.o. Gender: male  Primary Care Provider: Carollee Leitz, MD Consultants: Cardiology (s/o) Code Status: Full  Pt Overview and Major Events to Date:  1/25: admitted 1/29: medically stable for discharge  Assessment and Plan:  Anthony [name] a 65 y.o.malewho presented with urosepsis and A-Fib w/RVRdue to medication non-adherence. 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 Medically stable for discharge to SNF.    Difficult Disposition Patient unable to properly care for himself and has no family willing to take him in. Prior legal issues have also been a barrier to placement. -Appreciate SW assistance with SNF placement -Patient does not have any bed offers at this time  Recurrent Hypomagnesemia Mag has remained 1.5-1.6 throughout entire admission despite repletion. -Magnesium chloride 64mg  daily -Monitor with QOD labs  Paroxysmal A-Fib Patient remains in sinus rhythm with HR 60s-70s. -metoprolol succinate 25mg  daily -amiodarone 200mg  daily -xarelto 20mg  daily  Hx of Hypertension Chronic, very well-controlled. Has been normotensive (with some lower BPs throughout admission). Most recent BP 91/69. -MonitorBP -Consider decreasing metoprolol dose to 12.5 if blood pressure consistently less than 90 systolic.  Urosepsis- resolved Urine cx grew ampicillin resistance E. Coli. Patient is s/p Keflex x5 days  Alcohol use disorder Chronic, stable. Not in withdrawal, CIWAs have been discontinued. -Daily thiamine and folic acid  MDD  Anxiety Chronic, stable. -Continue sertraline 200 mg daily  Chronic Back Pain Chronic, stable.  -Tylenol as needed -K pad  Severe Malnutrition Nutrition following- they have  recommended ensure and magic cup supplementation -Continue nutrient supplementation   FEN/GI: Dysphagia 3 diet PPx: Xarelto   Status is: Inpatient Remains inpatient appropriate because:Unsafe d/c plan and awaiting SNF   Dispo:  Patient From: Home  Planned Disposition: [name]  Expected discharge date: 08/27/2020  Medically stable for discharge: Yes  Difficult to place patient: Yes   Subjective:  No acute events overnight. Patient reports some intermittent low back pain which is chronic and improved with Tylenol. He is requesting help trimming his fingernails. Denies drinking any mouthwash. Patient states he has been rinsing his mouth frequently because he bites his lower lip and gums.  Objective: Temp:  [98.3 F (36.8 C)-98.6 F (37 C)] 98.3 F (36.8 C) (02/09 0616) Pulse Rate:  [67-72] 67 (02/09 0616) Resp:  [16-17] 16 (02/09 0616) BP: (91-109)/(69-71) 91/69 (02/09 0616) SpO2:  [99 %] 99 % (02/09 0616) Physical Exam: General: alert, no acute distress Cardiovascular: RRR, normal S1/S2 Respiratory: normal WOB, lungs CTAB Abdomen: soft, nontender Extremities: no peripheral edema Neuro: alert, oriented x3  Laboratory: No results for input(s): WBC, HGB, HCT, PLT in the last 168 hours. Recent Labs  Lab 08/24/20 0329 08/26/20 0245 08/28/20 0151  NA 131* 136 139  K 4.5 3.8 4.3  CL 99 104 106  CO2 21* 24 23  BUN 14 19 17   CREATININE 0.71 0.93 0.76  CALCIUM 8.9 9.3 9.4  GLUCOSE 95 100* 87   Recent Labs    08/26/20 0245 08/28/20 0151  MG 1.6* 1.6*    Imaging/Diagnostic Tests: No new imaging/diagnostic tests   [name], MD 08/28/2020, 6:17 AM PGY-1, [place] Intern pager: [phone], text pages welcome

## 2020-08-29 DIAGNOSIS — I4891 Unspecified atrial fibrillation: Secondary | ICD-10-CM | POA: Diagnosis not present

## 2020-08-29 NOTE — Progress Notes (Addendum)
Family Medicine Teaching Service Daily Progress Note Intern Pager: [phone]  Patient name: Anthony [name] Medical record number: [id number] Date of birth: 17-Aug-1955 Age: 65 y.o. Gender: male  Primary Care Provider: Carollee Leitz, MD Consultants: Cardiology (s/o) Code Status: Full  Pt Overview and Major Events to Date:  1/25: admitted 1/29: medically stable for discharge  Assessment and Plan:  Anthony A Littleis a 65 y.o.malewho presented with urosepsis and A-Fib w/RVRdue to medication non-adherence. 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 Medically stable for discharge to SNF.    Difficult Disposition Patient's hx of alcohol use disorder and prior legal issues have complicated patient's disposition. He requires SNF level of care but has no bed offers at this time. -Appreciate SW assistance with SNF placement  Recurrent Hypomagnesemia Mag has remained 1.5-1.6 throughout entire admission despite repletion. -Magnesium chloride 64mg  daily -Monitor with QOD labs  Paroxysmal A-Fib Patient in sinus rhythm with HR in the 70s. -metoprolol succinate 25mg  daily -amiodarone 200mg  daily -xarelto 20mg  daily  Low Blood Pressure Patient with blood pressure in the 15A-309M systolic throughout admission. Asymptomatic. Most recent BP 100/67. -MonitorBP  Urosepsis- resolved Urine cx grew ampicillin resistance E. Coli. Patient is s/p Keflex x5 days  Alcohol use disorder Chronic, stable. Not in withdrawal, CIWAs have been discontinued. -Daily thiamine, folic acid, and multivitamin  MDD  Anxiety Chronic, stable. -Continue sertraline 200 mg daily  Chronic Back Pain Chronic, stable.  -Tylenol as needed -K pad  Severe Malnutrition Nutrition following- they have recommended ensure and magic cup supplementation -Continue nutrient supplementation  Deconditioning -Ongoing PT/OT   FEN/GI:  Dysphagia 3 diet PPx: Xarelto   Status is: Inpatient Remains inpatient appropriate because:Unsafe d/c plan and awaiting SNF   Dispo:  Patient From: Home  Planned Disposition: [place]  Expected discharge date: 08/30/2020  Medically stable for discharge: Yes  Difficult to place patient: Yes   Subjective:  No acute events overnight. Patient is very pleasant this morning- sitting up in bed and conversing appropriately. States he needs follow-up with his ophthalmologist (hasn't seen in years) and his dentist.  Objective: Temp:  [97.5 F (36.4 C)-98.5 F (36.9 C)] 98.1 F (36.7 C) (02/10 0407) Pulse Rate:  [67-75] 71 (02/10 0407) Resp:  [15-16] 15 (02/10 0407) BP: (91-100)/(63-71) 100/67 (02/10 0407) SpO2:  [97 %-99 %] 98 % (02/10 0407) Weight:  [62.2 kg-62.9 kg] 62.9 kg (02/10 0353) Physical Exam: General: alert, no acute distress Cardiovascular: RRR, normal S1/S2 Respiratory: normal WOB, lungs CTAB Abdomen: soft, nontender Extremities: no peripheral edema Neuro: alert, oriented x3 Psych: appropriate affect, slow but normal speech  Laboratory: No results for input(s): WBC, HGB, HCT, PLT in the last 168 hours. Recent Labs  Lab 08/24/20 0329 08/26/20 0245 08/28/20 0151  NA 131* 136 139  K 4.5 3.8 4.3  CL 99 104 106  CO2 21* 24 23  BUN 14 19 17   CREATININE 0.71 0.93 0.76  CALCIUM 8.9 9.3 9.4  GLUCOSE 95 100* 87   Recent Labs    08/28/20 0151  MG 1.6*    Imaging/Diagnostic Tests: No new imaging/diagnostic tests   [name], MD 08/29/2020, 6:09 AM PGY-65, [place] Intern pager: [phone], text pages welcome

## 2020-08-29 NOTE — Progress Notes (Signed)
Physical Therapy Treatment Patient Details Name: Anthony [name] MRN: [id number] DOB: [birth year] Today's Date: 08/29/2020    History of Present Illness Pt is a 65 y.o. male admitted 08/13/20 after being found by social worker in his hotel room naked, incontinent of urine/feces with AMS; EMS noted tachycardia and hypotension. Workup for urosepsis, afib with RVR. CT/MRI negative for acute intracranial abnormality. PMH includes afib, ETOH use, HTN, HF, chronic back pain, anxiety, depression.    PT Comments    Pt supine in bed on arrival this session. Pt performing all mobility MOD I to Independent.  He required no AD.  Presents with no LOB.  Based on previous behaviors he would benefit from ALF placement at d/c.  Will d/c patient from caseload.  Discussed d/c with patient and supervising PT.    Physical Therapy Discharge:  Patient discharged from PT services secondary to goals met and no further PT needs identified.  Please see latest therapy progress note for current level of functioning and progress toward goals.    Progress and discharge plan discussed with patient and/or caregiver: Patient agrees with plan  GP          Follow Up Recommendations  Other (comment) (ALF ( all PT goals met and will d/c patient from caseload))     Equipment Recommendations  None recommended by PT    Recommendations for Other Services       Precautions / Restrictions Precautions Precautions: Fall;Other (comment) Restrictions Weight Bearing Restrictions: No    Mobility  Bed Mobility Overal bed mobility: Needs Assistance;Modified Independent Bed Mobility: Supine to Sit Rolling: Modified independent (Device/Increase time)              Transfers Overall transfer level: Modified independent Equipment used: None Transfers: Sit to/from Stand Sit to Stand: Modified independent (Device/Increase time)            Ambulation/Gait Ambulation/Gait assistance: Independent Gait Distance  (Feet): 1200 Feet Assistive device: None Gait Pattern/deviations: WFL(Within Functional Limits)     General Gait Details: Cues for reciprocal armswing this session.  NO LOB and within functional limits.   Stairs Stairs: Yes Stairs assistance: Modified independent (Device/Increase time) Stair Management: One rail Left Number of Stairs: 12     Wheelchair Mobility    Modified Rankin (Stroke Patients Only)       Balance                                            Cognition Arousal/Alertness: Awake/alert Behavior During Therapy: WFL for tasks assessed/performed Overall Cognitive Status: Within Functional Limits for tasks assessed                                 General Comments: Pt appropriate and performing all mobility within functional limits.      Exercises      General Comments        Pertinent Vitals/Pain Pain Assessment: No/denies pain Pain Score: 0-No pain Pain Location: chronic back pain Pain Descriptors / Indicators: Sore Pain Intervention(s): Monitored during session    Home Living                      Prior Function            PT Goals (current goals can now be found in  the care plan section) Acute Rehab PT Goals Patient Stated Goal: to find a place to live Potential to Achieve Goals: Good Progress towards PT goals: Goals met/education completed, patient discharged from PT    Frequency    Min 2X/week      PT Plan Discharge plan needs to be updated    Co-evaluation              AM-PAC PT "6 Clicks" Mobility   Outcome Measure  Help needed turning from your back to your side while in a flat bed without using bedrails?: None Help needed moving from lying on your back to sitting on the side of a flat bed without using bedrails?: None Help needed moving to and from a bed to a chair (including a wheelchair)?: None Help needed standing up from a chair using your arms (e.g., wheelchair or  bedside chair)?: None Help needed to walk in hospital room?: None Help needed climbing 3-5 steps with a railing? : None 6 Click Score: 24    End of Session Equipment Utilized During Treatment: Gait belt Activity Tolerance: Patient tolerated treatment well Patient left: in chair;with call bell/phone within reach;with nursing/sitter in room Conservator, museum/gallery) Nurse Communication: Mobility status PT Visit Diagnosis: Unsteadiness on feet (R26.81)     Time: 3716-9678 PT Time Calculation (min) (ACUTE ONLY): 17 min  Charges:  $Gait Training: 8-22 mins                     Anthony [name] , PTA Acute Rehabilitation Services Pager [phone] Office [phone]     Anthony [name] 08/29/2020, 4:51 PM

## 2020-08-30 DIAGNOSIS — I4891 Unspecified atrial fibrillation: Secondary | ICD-10-CM | POA: Diagnosis not present

## 2020-08-30 NOTE — Social Work (Signed)
PT changed rec from SNF to ALF. Pt with no SNF offers to date. MD requesting referral to [place] [place] [facility]. Spoke to [place] at [facility] who reports their admissions team (Hazel/Karen) not available today. Referral faxed to [facility] for review Monday [phone]. Also sent to available ALFs in [place]. Pt ambulating 1200' with PT today but family does not live locally and are unable to assist with 24/7 supervision. Will provide updates as available.   Wandra Feinstein, MSW, LCSW [phone] (coverage)

## 2020-08-30 NOTE — Progress Notes (Signed)
Family Medicine Teaching Service Daily Progress Note Intern Pager: [phone]  Patient name: Anthony [name] Medical record number: [id number] Date of birth: Jul 25, 1955 Age: 65 y.o. Gender: male  Primary Care Provider: Carollee Leitz, MD Consultants: Cardiology (s/o) Code Status: Full  Pt Overview and Major Events to Date:  1/25: admitted 1/29: medically stable for discharge  Assessment and Plan:  [name] a 65 y.o.malewho presented with urosepsis and A-Fib w/RVRdue to medication non-adherence. 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 Medically stable for discharge to SNF/ALF.    Difficult Disposition Patient's hx of alcohol use disorder and prior legal issues have complicated patient's disposition. He requires SNF/ALF level of care but has no bed offers at this time. -Appreciate SW assistance with placement -Will reach out to SW today for update  Recurrent Hypomagnesemia Mag has remained 1.5-1.6 throughout entire admission despite repletion. Improves to 1.7-1.8 when corrected for albumin. -Magnesium chloride 64mg  daily -Monitor with weekly labs  Paroxysmal A-Fib Patient in sinus rhythm with HR in the 60s and 70s. -metoprolol succinate 25mg  daily -amiodarone 200mg  daily -xarelto 20mg  daily  Urosepsis- resolved Urine cx grew ampicillin resistance E. Coli. Patient is s/p Keflex x5 days  Alcohol use disorder Chronic, stable. Not in withdrawal, CIWAs have been discontinued. -Daily thiamine, folic acid, and multivitamin  MDD  Anxiety Chronic, stable. -Continue sertraline 200 mg daily  Chronic Back Pain Chronic, stable.  -Tylenol as needed -K pad  Severe Malnutrition Nutrition following- they have recommended ensure and magic cup supplementation -Continue nutrient supplementation   FEN/GI: Dysphagia 3 diet PPx: Xarelto   Status is: Inpatient Remains inpatient  appropriate because:Unsafe d/c plan  Dispo:  [facility]:  [facility]  Planned Disposition: Skilled Nursing Facility/ALF  Expected discharge date: 09/06/2020  Medically stable for discharge: Yes  Difficult to place patient: Yes   Subjective:  No acute events overnight. Patient denies complaints this morning other than some frustration with the lack of progress.  Objective: Temp:  [97.1 F (36.2 C)-100.2 F (37.9 C)] 97.1 F (36.2 C) (02/11 0604) Pulse Rate:  [67-75] 67 (02/11 0604) Resp:  [16-18] 18 (02/11 0604) BP: (93-106)/(63-67) 106/67 (02/11 0604) SpO2:  [98 %] 98 % (02/11 0604) Weight:  [62.5 kg] 62.5 kg (02/11 7416) Physical Exam: General: alert, no acute distress Cardiovascular: RRR, normal S1/S2 Respiratory: normal WOB, lungs CTAB Abdomen: soft, nontender Extremities: no peripheral edema Neuro: alert, oriented x3 Psych: appropriate affect, cooperative  Laboratory: No results for input(s): WBC, HGB, HCT, PLT in the last 168 hours. Recent Labs  Lab 08/24/20 0329 08/26/20 0245 08/28/20 0151  NA 131* 136 139  K 4.5 3.8 4.3  CL 99 104 106  CO2 21* 24 23  BUN 14 19 17   CREATININE 0.71 0.93 0.76  CALCIUM 8.9 9.3 9.4  GLUCOSE 95 100* 87   Recent Labs    08/28/20 0151  MG 1.6*    Imaging/Diagnostic Tests: No new imaging/diagnostic tests   [name], MD 08/30/2020, 6:17 AM PGY-1, [place] Intern pager: [phone], text pages welcome

## 2020-08-31 DIAGNOSIS — I4891 Unspecified atrial fibrillation: Secondary | ICD-10-CM | POA: Diagnosis not present

## 2020-08-31 DIAGNOSIS — Z659 Problem related to unspecified psychosocial circumstances: Secondary | ICD-10-CM | POA: Diagnosis not present

## 2020-08-31 DIAGNOSIS — F1020 Alcohol dependence, uncomplicated: Secondary | ICD-10-CM | POA: Diagnosis not present

## 2020-08-31 MED ORDER — LORATADINE 10 MG PO TABS
10.0000 mg | ORAL_TABLET | Freq: Once | ORAL | Status: AC
  Administered 2020-09-01: 10 mg via ORAL
  Filled 2020-08-31: qty 1

## 2020-08-31 NOTE — Progress Notes (Signed)
Family Medicine Teaching Service Daily Progress Note Intern Pager: [phone]  Patient name: Anthony [name] Medical record number: [id number] Date of birth: 1956/05/29 Age: 65 y.o. Gender: male  Primary Care Provider: Carollee Leitz, MD Consultants: Cardiology (s/o) Code Status: Full  Pt Overview and Major Events to Date:  1/25: admitted 1/29: medically stable for discharge  Assessment and Plan:  Anthony A Littleis a 65 y.o.malewho presented with urosepsis and A-Fib w/RVRdue to medication non-adherence. 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 Medically stable for discharge to SNF/ALF.    Difficult Disposition Patient's hx of alcohol use disorder and prior legal issues have complicated patient's disposition. He requires SNF/ALF level of care but has no bed offers at this time. PT recommends ALF. SW reached out for placement but administration not available. Contiue to work with PT, SW for safe disposition.  -Appreciate SW assistance with placement -Will reach out to SW today for update  Recurrent Hypomagnesemia Mag has remained 1.5-1.6 throughout entire admission despite repletion. Improves to 1.7-1.8 when corrected for albumin. Most recently 1.6 on 08/28/20.  -Magnesium chloride 64mg  daily -Monitor with weekly labs  Paroxysmal A-Fib Rate controlled. Regular rate and rhythm on exam.  -metoprolol succinate 25mg  daily -amiodarone 200mg  daily -xarelto 20mg  daily  Urosepsis- resolved Urine cx grew ampicillin resistance E. Coli. Patient is s/p Keflex x5 days  Alcohol use disorder Chronic, stable. Not in withdrawal, CIWAs have been discontinued. -Daily thiamine, folic acid, and multivitamin  MDD  Anxiety Chronic, stable. -Continue sertraline 200 mg daily  Chronic Back Pain Chronic, stable.  -Tylenol as needed -K pad  Severe Malnutrition Nutrition following- they have recommended ensure  and magic cup supplementation -Continue nutrient supplementation   FEN/GI: Dysphagia 3 diet PPx: Xarelto   Status is: Inpatient Remains inpatient appropriate because:Unsafe d/c plan  Dispo:  Patient From:  [facility]  Planned Disposition: Skilled Nursing Facility/ALF  Expected discharge date: 09/02/2020  Medically stable for discharge: Yes  Difficult to place patient: Yes   Subjective:  No acute overnight events.   Objective: Temp:  [98 F (36.7 C)-98.3 F (36.8 C)] 98.3 F (36.8 C) (02/12 0529) Pulse Rate:  [66-73] 66 (02/12 0529) Resp:  [16-19] 16 (02/12 0529) BP: (97-109)/(56-69) 109/56 (02/12 0529) SpO2:  [96 %-99 %] 98 % (02/12 0529) Weight:  [62.7 kg] 62.7 kg (02/12 0529) Physical Exam: GEN: pleasant elderly male, in no acute distress watching TV in bed CV: regular rate and rhythm RESP: no increased work of breathing, clear to ascultation bilaterally  ABD: Bowel sounds present. Soft, Nontender, Nondistended.  MSK: no edema, or cyanosis noted SKIN: warm, dry    Laboratory: No results for input(s): WBC, HGB, HCT, PLT in the last 168 hours. Recent Labs  Lab 08/26/20 0245 08/28/20 0151  NA 136 139  K 3.8 4.3  CL 104 106  CO2 24 23  BUN 19 17  CREATININE 0.93 0.76  CALCIUM 9.3 9.4  GLUCOSE 100* 87   No results for input(s): MG in the last 72 hours.  Imaging/Diagnostic Tests: No results found.    Lyndee Hensen, DO 08/31/2020, 8:41 AM PGY-2, [place] Intern pager: [phone], text pages welcome

## 2020-08-31 NOTE — Progress Notes (Signed)
This NT offered to assist pt with shower or bath pt refused stating "I will do it later."

## 2020-08-31 NOTE — Progress Notes (Addendum)
S: 65 yo patient reported itching on back, arms and legs, requested medication. RN reports rash on back. Patient unaware of rash, but he does have itching on his legs, arm, and back with a papular rash not mentioned in previous progress notes.  O:  Gen: elderly WM, resting comfortably in bed, alert, NAD Skin: dry, flaking skin on arms and legs, erythematous papules diffusely scattered across back.      A&P Papular rash: unclear etiology for rash, patient heretofore unaware of it. Will treat dry skin with lotion, will also treat itching of back and rash with 2nd gen antihistamine. DDx includes drug eruption vs acne vs heat eruption (lying on back). Will continue to monitor.

## 2020-08-31 NOTE — TOC Progression Note (Signed)
Transition of Care [facility]) - Progression Note    Patient Details  Name: Anthony [name] MRN: [id number] Date of Birth: 1955-12-10  Transition of Care [facility]) CM/SW [place], [place] Phone Number: [phone] 08/31/2020, 4:57 PM  Clinical Narrative:     Patient has no bed offers.  TOC team will continue to assist with discharge planning needs.  Expected Discharge Plan: [place] Barriers to Discharge: Continued Medical Work up  Expected Discharge Plan and Services Expected Discharge Plan: [place] arrangements for the past 2 months: Hotel/Motel                                       Social Determinants of Health (SDOH) Interventions    Readmission Risk Interventions No flowsheet data found.

## 2020-09-01 DIAGNOSIS — I4891 Unspecified atrial fibrillation: Secondary | ICD-10-CM | POA: Diagnosis not present

## 2020-09-01 NOTE — Progress Notes (Signed)
Family Medicine Teaching Service Daily Progress Note Intern Pager: [phone]  Patient name: Anthony [name] Medical record number: [id number] Date of birth: 01/18/1956 Age: 65 y.o. Gender: male  Primary Care Provider: Carollee Leitz, MD Consultants: Cardiology (s/o) Code Status: Full  Pt Overview and Major Events to Date:  1/25: admitted 1/29: medically stable for discharge  Assessment and Plan:  Anthony A Littleis a 65 y.o.malewho presented with urosepsis and A-Fib w/RVRdue to medication non-adherence. 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 Medically stable for discharge to SNF/ALF.    Difficult Disposition Patient's hx of alcohol use disorder and prior legal issues have complicated patient's disposition. He requires SNF/ALF level of care but has no bed offers at this time. PT recommends ALF. SW reached out for placement but administration not available. Contiue to work with PT, SW for safe disposition.  -Appreciate SW assistance with placement -Patient is medically stable for discharge to SNF/ALF.  Papular rash  Dry Skin: Noted to patient's back the evening of 2/12.  Patient also reports some pruritus associated with it.  Did not have this on admission.  Differential includes xerosis, acne, heat eruption due to lying on back.  Noninfectious appearing at this time. -Continue to monitor -Given Claritin, continue to moisturize back -Can add on hydrocortisone cream topical as needed  Recurrent Hypomagnesemia Mag has remained 1.5-1.6 throughout entire admission despite repletion. Improves to 1.7-1.8 when corrected for albumin. Most recently 1.6 on 08/28/20.  -Magnesium chloride 64mg  daily -Monitor with weekly labs  Paroxysmal A-Fib Rate controlled, heart rate today 08/1376.. Regular rate and rhythm on exam.  -metoprolol succinate 25mg  daily -amiodarone 200mg  daily -xarelto 20mg  daily  Urosepsis-  resolved Urine cx grew ampicillin resistance E. Coli. Patient is s/p Keflex x5 days  Alcohol use disorder Chronic, stable. Not in withdrawal, CIWAs have been discontinued. -Daily thiamine, folic acid, and multivitamin  MDD  Anxiety Chronic, stable. -Continue sertraline 200 mg daily  Chronic Back Pain Chronic, stable.  -Tylenol as needed -K pad  Severe Malnutrition Nutrition following- they have recommended ensure and magic cup supplementation -Continue nutrient supplementation  FEN/GI: Dysphagia 3 diet PPx: Xarelto  Status is: Inpatient Remains inpatient appropriate because:Unsafe d/c plan  Dispo:  Patient From:  [facility]  Planned Disposition: Skilled Nursing Facility/ALF  Expected discharge date: 09/02/2020  Medically stable for discharge: Yes  Difficult to place patient: Yes  Subjective:  Patient had some itching on his back, MD overnight visited patient and ordered moisturizing lotion and second-generation antihistamine.  Otherwise no events overnight.  Objective: Temp:  [97.5 F (36.4 C)-98.4 F (36.9 C)] 97.5 F (36.4 C) (02/12 2013) Pulse Rate:  [66-74] 74 (02/12 2013) Resp:  [16-17] 16 (02/12 2013) BP: (95-109)/(56-71) 95/67 (02/12 2013) SpO2:  [98 %] 98 % (02/12 2013) Weight:  [62.7 kg] 62.7 kg (02/12 0529) Physical Exam: GEN: pleasant elderly male, in no acute distress watching TV in bed CV: regular rate and rhythm RESP: no increased work of breathing, clear to ascultation bilaterally  ABD: Bowel sounds present. Soft, Nontender, Nondistended.  MSK: no edema, or cyanosis noted SKIN: warm, dry, rash appreciated to patient's upper back, dry skin diffusely  Laboratory: No results for input(s): WBC, HGB, HCT, PLT in the last 168 hours. Recent Labs  Lab 08/26/20 0245 08/28/20 0151  NA 136 139  K 3.8 4.3  CL 104 106  CO2 24 23  BUN 19 17  CREATININE 0.93 0.76  CALCIUM 9.3 9.4  GLUCOSE 100* 87   No results for input(s): MG in the last 72  hours.  Imaging/Diagnostic Tests: No results found.  Anthony Floro, DO 09/01/2020, 5:28 AM PGY-3, [place] Intern pager: [phone], text pages welcome

## 2020-09-01 NOTE — Plan of Care (Signed)
°  Problem: Clinical Measurements: °Goal: Will remain free from infection °Outcome: Progressing °  °Problem: Activity: °Goal: Risk for activity intolerance will decrease °Outcome: Progressing °  °Problem: Nutrition: °Goal: Adequate nutrition will be maintained °Outcome: Progressing °  °Problem: Elimination: °Goal: Will not experience complications related to bowel motility °Outcome: Progressing °  °

## 2020-09-02 DIAGNOSIS — I4891 Unspecified atrial fibrillation: Secondary | ICD-10-CM | POA: Diagnosis not present

## 2020-09-02 LAB — BASIC METABOLIC PANEL
Anion gap: 11 (ref 5–15)
BUN: 17 mg/dL (ref 8–23)
CO2: 23 mmol/L (ref 22–32)
Calcium: 9.4 mg/dL (ref 8.9–10.3)
Chloride: 107 mmol/L (ref 98–111)
Creatinine, Ser: 0.69 mg/dL (ref 0.61–1.24)
GFR, Estimated: >60 mL/min (ref >60)
Glucose, Bld: 82 mg/dL (ref 70–99)
Potassium: 4.2 mmol/L (ref 3.5–5.1)
Sodium: 141 mmol/L (ref 135–145)

## 2020-09-02 LAB — MAGNESIUM: Magnesium: 1.6 mg/dL — ABNORMAL LOW (ref 1.7–2.4)

## 2020-09-02 NOTE — TOC Progression Note (Signed)
Transition of Care [facility]) - Progression Note    Patient Details  Name: Anthony [name] MRN: [id number] Date of Birth: 1956-02-14  Transition of Care [facility]) CM/SW [place], [place] Phone Number: 09/02/2020, 4:35 PM  Clinical Narrative:     CSW reached out to [facility], Admission coordinator Alric Quan was gone for the day and csw was unable to leave a message. Pt has no bed offers at this time.   Expected Discharge Plan: [place] Barriers to Discharge: Continued Medical Work up  Expected Discharge Plan and Services Expected Discharge Plan: [place] arrangements for the past 2 months: Hotel/Motel                                       Social Determinants of Health (SDOH) Interventions    Readmission Risk Interventions No flowsheet data found.   Emeterio Reeve, Latanya Presser, [place] Social Worker [phone]

## 2020-09-02 NOTE — Progress Notes (Signed)
Family Medicine Teaching Service Daily Progress Note Intern Pager: [phone]  Patient name: Anthony [name] Medical record number: [id number] Date of birth: Feb 02, 1956 Age: 65 y.o. Gender: male  Primary Care Provider: Carollee Leitz, MD Consultants: Cardiology (s/o) Code Status: Full  Pt Overview and Major Events to Date:  1/25: admitted 1/29: medically stable for discharge  Assessment and Plan:  Anthony [name] a 65 y.o.malewho presented with urosepsis and A-Fib w/RVRdue to medication non-adherence. PMH is significant forA Fib, alcohol use disorder, HFpEF, HTN, chronic back pain, anxiety, depression, right PE. Patient is medically stable for discharge to ALF.  Difficult Placement Patient has been medically stable for discharge since 1/29. He requires ALF level of care due to cognitive deficits and inability to safely care for himself. Hx of alcohol use disorder and prior legal issues have delayed placement. -Appreciate SW assistance  Xerosis Patient complains of itching throughout bilateral arms and legs. It's improved somewhat by moisturizer. -Daily moisturizing lotion -Can consider Atarax prn for itching if persistent  Recurrent Hypomagnesemia 1.5-1.6 throughout admission despite daily repletion. Corrects to 1.7-1.8 due to low albumin. -Continue Mag chloride 64mg  daily -Weekly labs qMonday  Paroxysmal A-Fib Patient remains in sinus rhythm with HR 60s. -Continue Metoprolol 25mg  daily, amiodarone 200mg  daily and Xarelto 20mg  daily  Alcohol Use Disorder Chronic, stable. Last drink 07/21/2020. -Daily thiamine, folic acid, and multivitamin  MDD  Anxiety Chronic, stable. -Continue Sertraline 200mg  daily  Chronic Back Pain Chronic, stable. -Tylenol and K-pad prn  Malnutrition -Continue ensure/magic cup supplementation per nutrition  Urosepsis- Resolved S/p Keflex x5 days (08/16/20-08/21/20)   FEN/GI: Dysphagia 3 diet PPx: Xarelto   Status is: Inpatient Remains  inpatient appropriate because:Unsafe d/c plan  Dispo:  Patient From:    Planned Disposition: [place]  Expected discharge date: 09/09/2020  Medically stable for discharge: Yes  Difficult to place patient: Yes    Subjective:  No acute overnight events. Patient has some itching of bilateral arms and legs which is relieved somewhat with moisturizer. He is wondering if there's any way he can get reading glasses while he's here. Patient slightly frustrated about being stuck in the hospital.   Objective: Temp:  [98.1 F (36.7 C)-98.4 F (36.9 C)] 98.4 F (36.9 C) (02/14 0441) Pulse Rate:  [69-80] 69 (02/14 0441) Resp:  [20] 20 (02/13 2030) BP: (98-105)/(68-78) 105/74 (02/14 0441) SpO2:  [99 %] 99 % (02/14 0441) Weight:  [63.1 kg] 63.1 kg (02/14 0500) Physical Exam: General: alert, well-appearing elderly male, NAD Cardiovascular: RRR, normal S1/S2 without m/r/g Respiratory: normal WOB on room air Abdomen: soft, nontender Extremities: no peripheral edema Skin: few scattered excoriated areas on upper extremities, no obvious rash  Laboratory: No results for input(s): WBC, HGB, HCT, PLT in the last 168 hours. Recent Labs  Lab 08/28/20 0151 09/02/20 0712  NA 139 141  K 4.3 4.2  CL 106 107  CO2 23 23  BUN 17 17  CREATININE 0.76 0.69  CALCIUM 9.4 9.4  GLUCOSE 87 82   Recent Labs    09/02/20 0712  MG 1.6*   Imaging/Diagnostic Tests: No new imaging in past 24h.   [name], MD 09/02/2020, 9:44 AM PGY-1, [place] Intern pager: [phone], text pages welcome

## 2020-09-03 DIAGNOSIS — I4891 Unspecified atrial fibrillation: Secondary | ICD-10-CM | POA: Diagnosis not present

## 2020-09-03 MED ORDER — HYDROXYZINE HCL 25 MG PO TABS: 25.0000 mg | ORAL_TABLET | Freq: Two times a day (BID) | ORAL | Status: DC | PRN

## 2020-09-03 MED ORDER — CAMPHOR-MENTHOL 0.5-0.5 % EX LOTN
TOPICAL_LOTION | CUTANEOUS | Status: DC | PRN
  Filled 2020-09-03 (×2): qty 222

## 2020-09-03 NOTE — Progress Notes (Signed)
Family Medicine Teaching Service Daily Progress Note Intern Pager: [phone]  Patient name: Anthony [name] Medical record number: [id number] Date of birth: January 25, 1956 Age: 65 y.o. Gender: male  Primary Care Provider: Carollee Leitz, MD Consultants: Cardiology (s/o) Code Status: Full  Pt Overview and Major Events to Date:  1/25: admitted 1/29: medically stable for discharge  Assessment and Plan:  Winthrop A Littleis a 65 y.o.malewho presented with urosepsis and A-Fib w/RVRdue to medication non-adherence. PMH is significant forA Fib, alcohol use disorder, HFpEF, HTN, chronic back pain, anxiety, depression, right PE. Patient is medically stable for discharge to ALF.  Difficult Placement Patient has been medically stable for discharge since 1/29. He requires ALF level of care due to cognitive deficits and inability to safely care for himself. Hx of alcohol use disorder and prior legal issues have delayed placement. -Appreciate SW assistance  Xerosis Patient complains of itching throughout bilateral arms and legs. It's improved somewhat by moisturizer. -Daily moisturizing lotion -Atarax 25 mg BID prn for itching  Recurrent Hypomagnesemia 1.5-1.6 throughout admission despite daily repletion. Corrects to 1.7-1.8 due to low albumin. -Continue Mag chloride 64mg  daily -Weekly labs qMonday  Paroxysmal A-Fib Patient remains in sinus rhythm with HR between 65-85. -Continue Metoprolol 25mg  daily, amiodarone 200mg  daily and Xarelto 20mg  daily  Alcohol Use Disorder Chronic, stable. Last drink 07/21/2020. -Daily thiamine, folic acid, and multivitamin  MDD  Anxiety Chronic, stable. -Continue Sertraline 200mg  daily  Chronic Back Pain Chronic, stable. -Tylenol and K-pad prn  Malnutrition -Continue ensure/magic cup supplementation per nutrition  Urosepsis- Resolved S/p Keflex x5 days (08/16/20-08/21/20)   FEN/GI: Dysphagia 3 diet PPx: Xarelto   Status is: Inpatient Remains inpatient  appropriate because:Unsafe d/c plan  Dispo:  Patient From:    Planned Disposition: [place]  Expected discharge date: 09/09/2020  Medically stable for discharge: Yes  Difficult to place patient: Yes    Subjective:  No acute overnight events. Patient is pleasant this morning and has no complaints other than diffuse itching on bilateral upper and lower extremities.  States it was improved with moisturizing lotion yesterday, but became worse after he got a [name] bit sweaty last night.  He is pleased to be off the bed alarm.  Objective: Temp:  [97.9 F (36.6 C)-98.4 F (36.9 C)] 98.1 F (36.7 C) (02/15 0426) Pulse Rate:  [71-83] 71 (02/15 0426) Resp:  [16-20] 18 (02/15 0426) BP: (98-100)/(71-72) 100/71 (02/15 0426) SpO2:  [97 %-100 %] 97 % (02/15 0426) Physical Exam: General: alert, elderly male, NAD Cardiovascular: RRR, normal S1/S2 without m/r/g Respiratory: normal WOB on room air Abdomen: soft, nontender Extremities: no peripheral edema Skin: few scattered erythematous papules on upper back, no rash elsewhere on his body  Laboratory: No results for input(s): WBC, HGB, HCT, PLT in the last 168 hours. Recent Labs  Lab 08/28/20 0151 09/02/20 0712  NA 139 141  K 4.3 4.2  CL 106 107  CO2 23 23  BUN 17 17  CREATININE 0.76 0.69  CALCIUM 9.4 9.4  GLUCOSE 87 82   Recent Labs    09/02/20 0712  MG 1.6*   Imaging/Diagnostic Tests: No new imaging in past 24h.   [name], MD 09/03/2020, 6:12 AM PGY-1, [place] Intern pager: [phone], text pages welcome

## 2020-09-03 NOTE — TOC Progression Note (Signed)
Transition of Care [facility]) - Progression Note    Patient Details  Name: Anthony [name] MRN: [id number] Date of Birth: 1955-09-23  Transition of Care [facility]) CM/SW [place], [place] Phone Number: 09/03/2020, 12:51 PM  Clinical Narrative:     CSW reached out to [facility] and requested phone call back.  Minna Merritts Carmicheal from [place] reached out to CSW for updates. CSW informed her pt is now ALF appropriate. Ms London Pepper had no updates for CSW.   Expected Discharge Plan: [place] Barriers to Discharge: Continued Medical Work up  Expected Discharge Plan and Services Expected Discharge Plan: [place] arrangements for the past 2 months: Hotel/Motel                                       Social Determinants of Health (SDOH) Interventions    Readmission Risk Interventions No flowsheet data found.  Emeterio Reeve, Latanya Presser, [place] Social Worker [phone]

## 2020-09-03 NOTE — Progress Notes (Signed)
Nutrition Follow-up  DOCUMENTATION CODES:   Severe malnutrition in context of social or environmental circumstances  INTERVENTION:   -ContinueEnsure Enlive poBID, each supplement provides 350 kcal and 20 grams of protein -Continue MVI with minerals daily -Magic cup BID with meals, each supplement provides 290 kcal and 9 grams of protein -Due to medical stability, RD will sign off. If further nutrition-related issues arise, please re-consult RD  NUTRITION DIAGNOSIS:   Severe Malnutrition related to social / environmental circumstances as evidenced by moderate fat depletion,severe fat depletion,moderate muscle depletion,severe muscle depletion.  Ongoing  GOAL:   Patient will meet greater than or equal to 90% of their needs  Progressing   MONITOR:   PO intake,Supplement acceptance,Labs,Weight trends,Skin,I & O's  REASON FOR ASSESSMENT:   Consult Assessment of nutrition requirement/status  ASSESSMENT:   Anthony [name] is a 65 y.o. male presenting with urosepsis and A Fib in RVR. 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  Reviewed I/O's: +40 ml x 24 hours and -1.4 L since 08/20/20  UOP: 1 L x 24 hours  Pt with very good appetite. Noted meal completions 75-100%. Pt is consuming Ensure Enlive supplements per MAR.  Medications reviewed and include magnesium chloride, folic acid, and thiamine.   Per TOC notes, pt is medically stable for discharge. He is awaiting on facility placement.   Due to pt's medical stability, RD will sign off. If further nutrition-related concerns arise, please re-consult RD.   Labs reviewed.   Diet Order:   Diet Order            DIET DYS 3 Room service appropriate? No; Fluid consistency: Thin  Diet effective now                 EDUCATION NEEDS:   No education needs have been identified at this time  Skin:  Skin Assessment: Skin Integrity  Issues: Skin Integrity Issues:: Stage I Stage I: coccyx  Last BM:  09/01/20  Height:   Ht Readings from Last 1 Encounters:  08/20/20 5\' 8"  (1.727 m)    Weight:   Wt Readings from Last 1 Encounters:  09/02/20 63.1 kg    Ideal Body Weight:  70 kg  BMI:  Body mass index is 21.15 kg/m.  Estimated Nutritional Needs:   Kcal:  1900-2100  Protein:  105-120 grams  Fluid:  > 1.9 L    Loistine Chance, RD, LDN, [place] Registered Dietitian II Certified Diabetes Care and Education Specialist Please refer to [facility] for RD and/or RD on-call/weekend/after hours pager

## 2020-09-04 DIAGNOSIS — I4891 Unspecified atrial fibrillation: Secondary | ICD-10-CM | POA: Diagnosis not present

## 2020-09-04 MED ORDER — ACETAMINOPHEN 325 MG PO TABS
650.0000 mg | ORAL_TABLET | Freq: Two times a day (BID) | ORAL | Status: DC | PRN
  Administered 2020-09-04 – 2020-09-07 (×6): 650 mg via ORAL
  Filled 2020-09-04 (×7): qty 2

## 2020-09-04 MED ORDER — ACETAMINOPHEN 325 MG PO TABS
650.0000 mg | ORAL_TABLET | Freq: Every day | ORAL | Status: DC
  Administered 2020-09-05 – 2020-09-08 (×3): 650 mg via ORAL
  Filled 2020-09-04 (×3): qty 2

## 2020-09-04 NOTE — Progress Notes (Signed)
Family Medicine Teaching Service Daily Progress Note Intern Pager: [phone]  Patient name: Anthony [name] H[facility] Medical record number: [id number] Date of birth: [birth year] Age: 65 y.o. Gender: male  Primary Care Provider: Carollee Leitz, MD Consultants: Cardiology  Code Status: Full  Pt Overview and Major Events to Date:  1/25: admitted 1/29: medically stable for discharge  Assessment and Plan:  Anthony A Littleis a 64 y.o.malewho presented with urosepsis and A-Fib w/RVRdue to medication non-adherence. PMH is significant forA Fib, alcohol use disorder, HFpEF, HTN, chronic back pain, anxiety, depression, right PE. Patient is medically stable for discharge to ALF.  Difficult Placement Patient has been medically stable for discharge since 1/29. He requires ALF level of care due to cognitive deficits and inability to safely care for himself. Hx of alcohol use disorder and prior legal issues have delayed placement. -Appreciate SW assistance  Xerosis Patient complains of itching throughout bilateral arms and legs. It's improved somewhat by moisturizer. -Daily moisturizing lotion. Can give Sarna anti-itch lotion if needed.  -Atarax 25 mg BID prn for itching  Recurrent Hypomagnesemia 1.5-1.6 throughout admission despite daily repletion. Corrects to 1.7-1.8 due to low albumin. -Continue Mag chloride 64mg  daily -Weekly labs qMonday  Paroxysmal A-Fib Patient remains in sinus rhythm with HR between 65-85. -Continue Metoprolol 25mg  daily, amiodarone 200mg  daily and Xarelto 20mg  daily  Alcohol Use Disorder Chronic, stable. Last drink 07/21/2020. -Daily thiamine, folic acid, and multivitamin  MDD  Anxiety Chronic, stable. -Continue Sertraline 200mg  daily  Chronic Back Pain Chronic, stable. -Tylenol and K-pad prn  Malnutrition -Continue ensure/magic cup supplementation per nutrition  Urosepsis- Resolved S/p Keflex x5 days (08/16/20-08/21/20)  FEN/GI: Dysphagia 3 diet PPx:  Xarelto  Subjective:  Patient doing well. He does endorse some itching. Stated he felt slightly short of breath after getting out of the shower yesterday. Also reports some back pain and is requesting to have 1 of his doses scheduled instead of having to ask for them since he feels it takes a while to get his tylenol.   Objective: Temp:  [98 F (36.7 C)] 98 F (36.7 C) (02/15 1459) Pulse Rate:  [80] 80 (02/15 1459) BP: (99)/(68) 99/68 (02/15 1459) SpO2:  [98 %] 98 % (02/15 1459) Physical Exam: General: alert, elderly male, NAD Cardiovascular: RRR. No murmurs Respiratory: CTAB. Normal WOB Abdomen: soft, non distended, non tender Extremities: moving spontaneously. No edema.   Laboratory: No results for input(s): WBC, HGB, HCT, PLT in the last 168 hours. Recent Labs  Lab 09/02/20 0712  NA 141  K 4.2  CL 107  CO2 23  BUN 17  CREATININE 0.69  CALCIUM 9.4  GLUCOSE 82    Imaging/Diagnostic Tests: No results found.  Shary Key, DO 09/04/2020, 7:22 AM PGY-1, [place] Intern pager: [phone], text pages welcome

## 2020-09-04 NOTE — Progress Notes (Signed)
Occupational Therapy Treatment Patient Details Name: Anthony [name] MRN: [id number] DOB: 11-07-1955 Today's Date: 09/04/2020    History of present illness Pt is a 65 y.o. male admitted 08/13/20 after being found by social worker in his hotel room naked, incontinent of urine/feces with AMS; EMS noted tachycardia and hypotension. Workup for urosepsis, afib with RVR. CT/MRI negative for acute intracranial abnormality. PMH includes afib, ETOH use, HTN, HF, chronic back pain, anxiety, depression.   OT comments  Pt. Was cooperative during therapy. Pt. Did not have any LOB during ADLs task. Pt. Was able to stand at sink for 20 min for bathing and grooming. Pt. Is worried about his dc disposition.   Follow Up Recommendations       Equipment Recommendations  None recommended by OT    Recommendations for Other Services      Precautions / Restrictions Precautions Precautions: Fall;Other (comment) Precaution Comments: Incontinence Restrictions Weight Bearing Restrictions: No       Mobility Bed Mobility Overal bed mobility: Modified Independent                Transfers       Sit to Stand: Modified independent (Device/Increase time) Stand pivot transfers: Modified independent (Device/Increase time)       General transfer comment: Pt. Ambulated at S level without AD.    Balance     Sitting balance-Leahy Scale: Good       Standing balance-Leahy Scale: Fair                             ADL either performed or assessed with clinical judgement   ADL Overall ADL's : Needs assistance/impaired Eating/Feeding: Independent;Sitting   Grooming: Wash/dry hands;Oral care;Wash/dry face;Brushing hair;Applying deodorant;Modified independent   Upper Body Bathing: Modified independent   Lower Body Bathing: Supervison/ safety   Upper Body Dressing : Modified independent   Lower Body Dressing: Supervision/safety   Toilet Transfer: Supervision/safety;Ambulation    Toileting- Clothing Manipulation and Hygiene: Modified independent       Functional mobility during ADLs: Supervision/safety General ADL Comments: Pt. has decreased safety.     Vision   Vision Assessment?: No apparent visual deficits   Perception     Praxis      Cognition Arousal/Alertness: Awake/alert Behavior During Therapy: WFL for tasks assessed/performed Overall Cognitive Status: Within Functional Limits for tasks assessed Area of Impairment: Safety/judgement;Awareness                 Orientation Level: Situation Current Attention Level: Selective Memory: Decreased recall of precautions;Decreased short-term memory Following Commands: Follows one step commands consistently;Follows multi-step commands inconsistently Safety/Judgement: Decreased awareness of safety;Decreased awareness of deficits Awareness: Emergent Problem Solving: Slow processing;Requires verbal cues General Comments: Pt appropriate and performing all mobility within functional limits.        Exercises     Shoulder Instructions       General Comments      Pertinent Vitals/ Pain       Pain Assessment: 0-10 Pain Score: 5  Pain Location: chronic back pain Pain Descriptors / Indicators: Sore Pain Intervention(s): Monitored during session (Pt. does not want pain meds currently)  Home Living                                          Prior Functioning/Environment  Frequency  Min 1X/week        Progress Toward Goals  OT Goals(current goals can now be found in the care plan section)  Progress towards OT goals: Progressing toward goals  Acute Rehab OT Goals Patient Stated Goal: to find a place to live OT Goal Formulation: With patient Time For Goal Achievement: 09/05/20 Potential to Achieve Goals: Good ADL Goals Pt Will Perform Grooming: with modified independence;standing Pt Will Perform Lower Body Dressing: with modified independence;sit  to/from stand Pt Will Transfer to Toilet: with modified independence;ambulating;regular height toilet Pt Will Perform Toileting - Clothing Manipulation and hygiene: with modified independence;sit to/from stand Pt/caregiver will Perform Home Exercise Program: Increased strength;Both right and left upper extremity;With written HEP provided Additional ADL Goal #1: Pt will complete x10 mins of ADL tasks with 1 seated rest break with minguardA overall Additional ADL Goal #2: Pt will perform higher level cognitive task with minimal cues to attend.  Plan Discharge plan remains appropriate    Co-evaluation                 AM-PAC OT "6 Clicks" Daily Activity     Outcome Measure   Help from another person eating meals?: None Help from another person taking care of personal grooming?: A [place] Help from another person toileting, which includes using toliet, bedpan, or urinal?: A [place] Help from another person bathing (including washing, rinsing, drying)?: A [place] Help from another person to put on and taking off regular upper body clothing?: None Help from another person to put on and taking off regular lower body clothing?: A [place] 6 Click Score: 20    End of Session    OT Visit Diagnosis: Unsteadiness on feet (R26.81);Other symptoms and signs involving cognitive function   Activity Tolerance Patient tolerated treatment well   Patient Left with call bell/phone within reach;in bed   Nurse Communication Mobility status        Time: 6222-9798 OT Time Calculation (min): 44 min  Charges: OT General Charges $OT Visit: 1 Visit OT Treatments $Self Care/Home Management : 38-52 mins  Reece Packer OT/L    [place] 09/04/2020, 10:35 AM

## 2020-09-05 DIAGNOSIS — I4891 Unspecified atrial fibrillation: Secondary | ICD-10-CM | POA: Diagnosis not present

## 2020-09-05 DIAGNOSIS — Z659 Problem related to unspecified psychosocial circumstances: Secondary | ICD-10-CM | POA: Diagnosis not present

## 2020-09-05 DIAGNOSIS — F1020 Alcohol dependence, uncomplicated: Secondary | ICD-10-CM | POA: Diagnosis not present

## 2020-09-05 MED ORDER — DICLOFENAC SODIUM 1 % EX GEL
2.0000 g | Freq: Four times a day (QID) | CUTANEOUS | Status: DC
  Administered 2020-09-05 – 2020-09-09 (×13): 2 g via TOPICAL
  Filled 2020-09-05: qty 100

## 2020-09-05 NOTE — Progress Notes (Signed)
Family Medicine Teaching Service Daily Progress Note Intern Pager: [phone]  Patient name: Anthony [name] Lung Medical record number: [id number] Date of birth: 1955/11/08 Age: 65 y.o. Gender: male  Primary Care Provider: Carollee Leitz, MD Consultants: Cardiology Code Status: Full   Pt Overview and Major Events to Date:  1/25: admitted 1/29: medically stable for discharge  Assessment and Plan:  Gabriell A Littleis a 65 y.o.malewho presented with urosepsis and A-Fib w/RVRdue to medication non-adherence. PMH is significant forA Fib, alcohol use disorder, HFpEF, HTN, chronic back pain, anxiety, depression, right PE. Patient is medically stable for discharge to ALF.  Difficult Placement Patient has been medically stable for discharge since 1/29. He requires ALF level of care due to cognitive deficits and inability to safely care for himself. Hx of alcohol use disorder and prior legal issues have delayed placement. -Appreciate SW assistance  Xerosis Patient complains of itching throughout bilateral arms and legs. It's improved somewhat by moisturizer. -Daily moisturizing lotion.  -Atarax 25 mgBID prnfor itching - anti itch lotion applied twice daily   Recurrent Hypomagnesemia 1.5-1.6 throughout admission despite daily repletion. Corrects to 1.7-1.8 due to low albumin. -Continue Mag chloride 64mg  daily -Weekly labs qMonday  Paroxysmal A-Fib Patient remains in sinus rhythm with HRbetween 65-85. -Continue Metoprolol 25mg  daily, amiodarone 200mg  daily and Xarelto 20mg  daily  Alcohol Use Disorder Chronic, stable. Last drink 07/21/2020. -Daily thiamine, folic acid, and multivitamin  MDD  Anxiety Chronic, stable. -Continue Sertraline 200mg  daily  Chronic Back Pain Chronic, stable. -Tylenol and K-pad prn - Voltaren gel 2g QID   Malnutrition -Continue ensure/magic cup supplementation per nutrition  Urosepsis- Resolved S/p Keflex x5 days (08/16/20-08/21/20)  FEN/GI:  Dysphagia 3 diet PPx: Xarelto Disposition: SNF placement when bed available  Subjective:  Patient sitting in bed comfortably. Endorses itching that was improved with anti itch lotion that he has at bedside. Does endorse lower back pain with Dr. Owens Shark. Denies any other complaints for [place] this morning.   Objective: Temp:  [97.8 F (36.6 C)-98 F (36.7 C)] 97.9 F (36.6 C) (02/17 0351) Pulse Rate:  [77-83] 77 (02/17 0351) Resp:  [16-17] 17 (02/17 0351) BP: (103-108)/(68-76) 103/73 (02/17 0351) SpO2:  [98 %-100 %] 98 % (02/17 0351) Physical Exam: General: alert, MAD Cardiovascular: RRR no murmurs  Respiratory: CTAB. Normal WOB Abdomen: soft, non distended, non tender  Extremities: warm, dry. No edema   Laboratory: No results for input(s): WBC, HGB, HCT, PLT in the last 168 hours. Recent Labs  Lab 09/02/20 0712  NA 141  K 4.2  CL 107  CO2 23  BUN 17  CREATININE 0.69  CALCIUM 9.4  GLUCOSE 82     Imaging/Diagnostic Tests:  No results found.  Shary Key, DO 09/05/2020, 11:50 AM PGY-1, [place] Intern pager: [phone], text pages welcome

## 2020-09-05 NOTE — Progress Notes (Signed)
FMTS Brief Note   Spoke with Education officer, museum and asked to escalate care to difficult to place list. She has reached out to other facilities to take patient.  Dorris Singh, MD  Family Medicine Teaching Service

## 2020-09-05 NOTE — NC FL2 (Signed)
[place] LEVEL OF CARE SCREENING TOOL     IDENTIFICATION  Patient Name: Anthony [name] Birthdate: [birth year] Sex: male Admission Date (Current Location): 08/13/2020  [facility] and [place] Number:  Herbalist and Address:  [facility]. [facility], [place] [address] [place], [place] [address]      Provider Number: [id number]  Attending Physician Name and Address:  Martyn Malay, MD  Relative Name and Phone Number:  [name] [phone]    Current Level of Care: Hospital Recommended Level of Care: [place] Prior Approval Number:    Date Approved/Denied:   PASRR Number: [id number]  Discharge Plan: Other (Comment) (ALF/ Family care home)    Current Diagnoses: Patient Active Problem List   Diagnosis Date Noted  . Hypomagnesemia   . Protein-calorie malnutrition, severe 08/22/2020  . Alcohol use disorder, severe, in controlled environment ([place])   . Pressure injury of skin 08/18/2020  . Urinary tract infection without hematuria   . Severe sepsis ([place]) 08/14/2020  . Atrial fibrillation with RVR ([place]) 08/13/2020  . Rhabdomyolysis 07/22/2020  . Cerebral ventriculomegaly 07/22/2020  . Hemoglobin decreased 07/22/2020  . History of delirium tremons 07/22/2020  . Hypoalbuminemia due to protein-calorie malnutrition ([place]) 07/22/2020  . Tobacco use disorder 07/22/2020  . Alcohol withdrawal delirium ([place])   . Dehydration   . Alcohol abuse with alcohol-induced mood disorder ([place]) 07/18/2018  . Depression 05/26/2018  . Need for immunization against influenza 04/25/2018  . Alcohol withdrawal ([place]) 09/01/2017  . Actinic keratosis 11/27/2016  . Macrocytic anemia 11/23/2016  . History of pulmonary embolus (PE) 11/02/2016  . Hiatal hernia 09/14/2016  . Skin cancer 08/13/2016  . Poor social situation 06/09/2013  . Tinea versicolor 06/08/2013  . 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 HTN (hypertension) 04/11/2012  .  Alcohol use disorder, severe, dependence ([place]) 09/16/2006    Orientation RESPIRATION BLADDER Height & Weight     Self,Time,Situation,Place  Normal Continent Weight: 139 lb 1.8 oz (63.1 kg) (standing scale) Height:  5\' 8"  (172.7 cm)  BEHAVIORAL SYMPTOMS/MOOD NEUROLOGICAL BOWEL NUTRITION STATUS      Continent Diet  AMBULATORY STATUS COMMUNICATION OF NEEDS Skin   Independent Verbally Normal                       Personal Care Assistance Level of Assistance  Bathing,Feeding,Dressing Bathing Assistance: Independent Feeding assistance: Independent Dressing Assistance: Independent     Functional Limitations Info  Sight,Hearing,Speech Sight Info: Adequate Hearing Info: Adequate Speech Info: Adequate    SPECIAL CARE FACTORS FREQUENCY  PT (By licensed PT),OT (By licensed OT)     PT Frequency: 5x min weekly OT Frequency: 5x min weekly            Contractures Contractures Info: Not present    Additional Factors Info  Code Status,Allergies,Psychotropic Code Status Info: Full Allergies Info: NKA Psychotropic Info: Zoloft 100mg  daily PO         Current Medications (09/05/2020):  This is the current hospital active medication list Current Facility-Administered Medications  Medication Dose Route Frequency Provider Last Rate Last Admin  . 0.9 %  sodium chloride infusion   Intravenous PRN Zenia Resides, MD   Stopped at 08/16/20 0118  . acetaminophen (TYLENOL) tablet 650 mg  650 mg Oral BID PRN Shary Key, DO   650 mg at 09/05/20 0024  . acetaminophen (TYLENOL) tablet 650 mg  650 mg Oral Daily Shary Key, DO  650 mg at 09/05/20 1043  . amiodarone (PACERONE) tablet 200 mg  200 mg Oral Daily Reino Bellis B, NP   200 mg at 09/05/20 1044  . camphor-menthol (SARNA) lotion   Topical PRN [name], MD      . chlorhexidine (PERIDEX) 0.12 % solution 15 mL  15 mL Mouth/Throat BID Matilde Haymaker, MD   15 mL at 09/05/20 1043  . feeding supplement (ENSURE  ENLIVE / ENSURE PLUS) liquid 237 mL  237 mL Oral BID BM Hensel, Jamal Collin, MD   237 mL at 09/05/20 1045  . folic acid (FOLVITE) tablet 1 mg  1 mg Oral Daily [name], MD   1 mg at 09/05/20 1044  . hydroxypropyl methylcellulose / hypromellose (ISOPTO TEARS / GONIOVISC) 2.5 % ophthalmic solution 1 drop  1 drop Both Eyes PRN Milus Banister C, DO      . magnesium chloride (SLOW-MAG) 64 MG SR tablet 64 mg  1 tablet Oral Daily Dickie La, MD   64 mg at 09/05/20 1044  . metoprolol succinate (TOPROL-XL) 24 hr tablet 25 mg  25 mg Oral Daily Matilde Haymaker, MD   25 mg at 09/05/20 1044  . multivitamin with minerals tablet 1 tablet  1 tablet Oral Daily Zenia Resides, MD   1 tablet at 09/05/20 1043  . rivaroxaban (XARELTO) tablet 20 mg  20 mg Oral Q supper Zenia Resides, MD   20 mg at 09/04/20 1653  . sertraline (ZOLOFT) tablet 100 mg  100 mg Oral Daily Zenia Resides, MD   100 mg at 09/05/20 1043  . thiamine tablet 100 mg  100 mg Oral Daily [name], MD   100 mg at 09/05/20 1044     Discharge Medications: Please see discharge summary for a list of discharge medications.  Relevant Imaging Results:  Relevant Lab Results:   Additional Information [phone]  Emeterio Reeve, [place]

## 2020-09-06 DIAGNOSIS — Z659 Problem related to unspecified psychosocial circumstances: Secondary | ICD-10-CM | POA: Diagnosis not present

## 2020-09-06 DIAGNOSIS — E43 Unspecified severe protein-calorie malnutrition: Secondary | ICD-10-CM | POA: Diagnosis not present

## 2020-09-06 DIAGNOSIS — F1020 Alcohol dependence, uncomplicated: Secondary | ICD-10-CM | POA: Diagnosis not present

## 2020-09-06 NOTE — TOC Progression Note (Addendum)
Transition of Care [facility]) - Progression Note    Patient Details  Name: Anthony [name] MRN: [id number] Date of Birth: 1956/05/29  Transition of Care [facility]) CM/SW [place], [place] Phone Number: 09/06/2020, 3:43 PM  Clinical Narrative:     CSW made referral to [facility] (fax # [phone]) [facility] (ph# [phone], fax number [phone]) and [facility] (ph [phone], fax [phone]). TOC will follow up with ALFs for placement.   Expected Discharge Plan: [place] Barriers to Discharge: Continued Medical Work up  Expected Discharge Plan and Services Expected Discharge Plan: [place] arrangements for the past 2 months: Hotel/Motel                                       Social Determinants of Health (SDOH) Interventions    Readmission Risk Interventions No flowsheet data found.  Emeterio Reeve, Latanya Presser, [place] Social Worker [phone]

## 2020-09-06 NOTE — Progress Notes (Signed)
Family Medicine Teaching Service Daily Progress Note Intern Pager: [phone]  Patient name: Anthony [name] Medical record number: [id number] Date of birth: 01/12/1956 Age: 65 y.o. Gender: male  Primary Care Provider: Carollee Leitz, MD Consultants: Cardiology Code Status: Full  Pt Overview and Major Events to Date:  1/25: admitted 1/29: medically stable for discharge  Assessment and Plan:  Bell A Littleis a 65 y.o.malewho presented with urosepsis and A-Fib w/RVRdue to medication non-adherence. PMH is significant forA Fib, alcohol use disorder, HFpEF, HTN, chronic back pain, anxiety, depression, right PE. Patient is medically stable for discharge to ALF.  Difficult Placement Patient has been medically stable for discharge since 1/29. He requires ALF level of care due to cognitive deficits and inability to safely care for himself. Hx of alcohol use disorder and prior legal issues have delayed placement. -Appreciate SW assistance  Xerosis Patient complains of itching throughout bilateral arms and legs. It's improved somewhat by moisturizer. -Daily moisturizing lotion.  -Atarax 25 mgBID prnfor itching - anti itch lotion applied twice daily   Recurrent Hypomagnesemia 1.5-1.6 throughout admission despite daily repletion. Corrects to 1.7-1.8 due to low albumin. -Continue Mag chloride 64mg  daily -Weekly labs qMonday  Paroxysmal A-Fib Patient remains in sinus rhythm with HRbetween 65-85. -Continue Metoprolol 25mg  daily, amiodarone 200mg  daily and Xarelto 20mg  daily  Alcohol Use Disorder Chronic, stable. Last drink 07/21/2020. -Daily thiamine, folic acid, and multivitamin  MDD  Anxiety Chronic, stable. -Continue Sertraline 200mg  daily  Chronic Back Pain Chronic, stable. -Tylenol and K-pad prn - Voltaren gel 2g QID   Malnutrition -Continue ensure/magic cup supplementation per nutrition  Urosepsis- Resolved S/p Keflex x5 days (08/16/20-08/21/20)  FEN/GI: Dysphagia  3 diet PPx: Xarelto  Disposition: SNF placement when bed available  Subjective:  No acute events overnight. He asks about his previous infection when he first came to the hospital and what was done for that. We discussed how he was on Keflex for his urinary infection. No other concerns or complaints.   Objective: Temp:  [98 F (36.7 C)-98.3 F (36.8 C)] 98.3 F (36.8 C) (02/18 1442) Pulse Rate:  [79-85] 79 (02/18 1442) Resp:  [17] 17 (02/18 1442) BP: (91-115)/(60-81) 91/60 (02/18 1442) SpO2:  [99 %-100 %] 99 % (02/18 1442) Physical Exam: General: alert, pleasant, NAD Cardiovascular: RRR no murmurs Respiratory: CTAB. Normal WOB Abdomen: soft, non distended Extremities: warm, dry. No edema   Laboratory: No results for input(s): WBC, HGB, HCT, PLT in the last 168 hours. Recent Labs  Lab 09/02/20 0712  NA 141  K 4.2  CL 107  CO2 23  BUN 17  CREATININE 0.69  CALCIUM 9.4  GLUCOSE 82    Imaging/Diagnostic Tests: No results found.  Shary Key, DO 09/06/2020, 3:11 PM PGY-1, [place] Intern pager: [phone], text pages welcome

## 2020-09-06 NOTE — Progress Notes (Addendum)
Family Medicine Teaching Service Daily Progress Note Intern Pager: [phone]  Patient name: Anthony [name] [place] Medical record number: [id number] Date of birth: 04-27-1956 Age: 65 y.o. Gender: male  Primary Care Provider: Carollee Leitz, MD Consultants: Cardiology Code Status: Full  Pt Overview and Major Events to Date:  1/25: admitted 1/29: medically stable for discharge  Assessment and Plan:  Anthony [name] a 65 y.o.malewho presented with urosepsis and A-Fib w/RVRdue to medication non-adherence. PMH is significant forA Fib, alcohol use disorder, HFpEF, HTN, chronic back pain, anxiety, depression, right PE.  Patient remains medically stable for discharge to ALF.  Difficult Placement Patient has been medically stable for discharge since 1/29. He requires ALF level of care due to cognitive deficits and inability to safely care for himself. Hx of alcohol use disorder and prior legal issues have delayed placement. -Appreciate SW assistance  Xerosis Patient complains of itching throughout bilateral arms and legs. It's improved somewhat by moisturizer.  Today he states his itching is still present but has improved after using the lotions. -Daily moisturizing lotion.  -Atarax 25 mgBID prnfor itching - anti itch lotion applied twice daily   Recurrent Hypomagnesemia 1.5-1.6 throughout admission despite daily repletion. -Continue Mag chloride 64mg  daily -Weekly labs each Monday  Paroxysmal A-Fib Patient remains in sinus rhythm with HRbetween 65-85. -Continue Metoprolol 25mg  daily, amiodarone 200mg  daily and Xarelto 20mg  daily  Alcohol Use Disorder Chronic, stable. Last drink 07/21/2020. -Daily thiamine, folic acid, and multivitamin  MDD  Anxiety Chronic, stable. -Continue Sertraline 200mg  daily  Chronic Back Pain Chronic, stable. -Tylenol and K-pad prn - Voltaren gel 2g QID   Malnutrition -Continue ensure/magic cup supplementation per nutrition  Urosepsis-  Resolved S/p Keflex x5 days (08/16/20-08/21/20)  FEN/GI: Dysphagia 3 diet PPx: Xarelto  Disposition:  Medically stable for discharge to ALF or other facility with supervision for medications and ADLs when available.  Subjective:  Patient with no complaints today.  States he is anxious to find out where he might be placed as he has heard some "bad things" about assisted living facilities.  States that the itching on his arms has improved after using the lotions.  He states he has been trying to ambulate the halls with assistance for exercise.  Objective: Temp:  [98 F (36.7 C)-98.3 F (36.8 C)] 98.1 F (36.7 C) (02/18 1957) Pulse Rate:  [79-85] 82 (02/18 1957) Resp:  [17] 17 (02/18 1957) BP: (91-115)/(60-80) 101/75 (02/18 1957) SpO2:  [99 %-100 %] 99 % (02/18 1957) Physical Exam: General: Alert and oriented in no apparent distress Heart: Regular rate and rhythm with no murmurs appreciated Lungs: CTA bilaterally, no respiratory distress Extremities: No lower extremity edema  Laboratory: No results for input(s): WBC, HGB, HCT, PLT in the last 168 hours. Recent Labs  Lab 09/02/20 0712  NA 141  K 4.2  CL 107  CO2 23  BUN 17  CREATININE 0.69  CALCIUM 9.4  GLUCOSE 82    Imaging/Diagnostic Tests: No results found.  Anthony Del, DO 09/06/2020, 10:29 PM PGY-2, [place] Intern pager: [phone], text pages welcome

## 2020-09-07 DIAGNOSIS — E43 Unspecified severe protein-calorie malnutrition: Secondary | ICD-10-CM | POA: Diagnosis not present

## 2020-09-07 DIAGNOSIS — Z659 Problem related to unspecified psychosocial circumstances: Secondary | ICD-10-CM | POA: Diagnosis not present

## 2020-09-07 NOTE — Progress Notes (Signed)
Family Medicine Teaching Service Daily Progress Note Intern Pager: [phone]  Patient name: Anthony [name] [place] Medical record number: [id number] Date of birth: Mar 27, 1956 Age: 65 y.o. Gender: male  Primary Care Provider: Carollee Leitz, MD Consultants: Cardiology Code Status: Full  Pt Overview and Major Events to Date:  1/25: admitted 1/29: medically stable for discharge  Assessment and Plan:  Randal A Littleis a 65 y.o.malewho presented with urosepsis and A-Fib w/RVRdue to medication non-adherence, now resolved. PMH is significant forA Fib, alcohol use disorder, HFpEF, HTN, chronic back pain, anxiety, depression, right PE.  Patient remains medically stable to discharge to ALF.  Difficult Placement Patient remains medically stable for discharge and has been since 1/29.  Given his cognitive deficits, will need to go to an assisted living facility.  Given a history of prior alcohol use disorder and legal issues there have been difficulties with placement.  Per social work note on 2/18, referral was made to [facility], [facility], and [facility].  Social work continues to follow. -Appreciate SW assistance  Xerosis Today does not complain of this. -Daily moisturizing lotion.  -Atarax 25 mgBID prnfor itching - anti itch lotion applied twice daily   Recurrent Hypomagnesemia 1.5-1.6 throughout admission despite daily repletion. -Continue Mag chloride 64mg  daily -Weekly labs each Monday, next on 2/21  Paroxysmal A-Fib HR 68 this AM. -Continue Metoprolol 25mg  daily, amiodarone 200mg  daily and Xarelto 20mg  daily  Alcohol Use Disorder Chronic, stable. Last drink 07/21/2020. -Daily thiamine, folic acid, and multivitamin  MDD  Anxiety Chronic, stable. -Continue Sertraline 200mg  daily  Chronic Back Pain Chronic, stable. -Tylenol and K-pad prn - Voltaren gel 2g QID   Malnutrition -Continue ensure/magic cup supplementation per nutrition  Urosepsis- Resolved S/p  Keflex x5 days (08/16/20-08/21/20)  FEN/GI: Dysphagia 3 diet PPx: Xarelto  Disposition:  Medically stable for discharge to ALF or other facility with supervision for medications and ADLs when available.  Subjective:  Denies any complaints today other that not having anything to do.  No other concerns.  Objective: Temp:  [98.1 F (36.7 C)] 98.1 F (36.7 C) (02/20 0510) Pulse Rate:  [68-79] 68 (02/20 0510) Resp:  [17] 17 (02/20 0510) BP: (97-105)/(65-70) 105/70 (02/20 0510) SpO2:  [99 %] 99 % (02/20 0510)  Physical Exam:  General: 65 y.o. male in NAD, lying in bed Cardio: RRR no m/r/g Lungs: CTAB, no wheezing, no rhonchi, no crackles, no IWOB on RA Skin: warm and dry Extremities: No edema   Laboratory: No results for input(s): WBC, HGB, HCT, PLT in the last 168 hours. Recent Labs  Lab 09/02/20 0712  NA 141  K 4.2  CL 107  CO2 23  BUN 17  CREATININE 0.69  CALCIUM 9.4  GLUCOSE 82    Imaging/Diagnostic Tests: No results found.  [place], DO 09/08/2020, 5:18 AM PGY-3, [place] Intern pager: [phone], text pages welcome

## 2020-09-08 DIAGNOSIS — I4891 Unspecified atrial fibrillation: Secondary | ICD-10-CM | POA: Diagnosis not present

## 2020-09-08 DIAGNOSIS — Z659 Problem related to unspecified psychosocial circumstances: Secondary | ICD-10-CM | POA: Diagnosis not present

## 2020-09-08 DIAGNOSIS — F1020 Alcohol dependence, uncomplicated: Secondary | ICD-10-CM | POA: Diagnosis not present

## 2020-09-08 MED ORDER — ACETAMINOPHEN 500 MG PO TABS
1000.0000 mg | ORAL_TABLET | Freq: Two times a day (BID) | ORAL | Status: AC
  Administered 2020-09-08 (×2): 1000 mg via ORAL
  Filled 2020-09-08 (×2): qty 2

## 2020-09-08 MED ORDER — ACETAMINOPHEN 500 MG PO TABS
1000.0000 mg | ORAL_TABLET | Freq: Three times a day (TID) | ORAL | Status: DC
  Administered 2020-09-09 – 2020-09-26 (×51): 1000 mg via ORAL
  Filled 2020-09-08 (×53): qty 2

## 2020-09-08 MED ORDER — ACETAMINOPHEN 500 MG PO TABS: 1000.0000 mg | ORAL_TABLET | Freq: Two times a day (BID) | ORAL | Status: DC

## 2020-09-08 MED ORDER — LIDOCAINE 5 % EX PTCH
1.0000 | MEDICATED_PATCH | CUTANEOUS | Status: DC
  Administered 2020-09-08 – 2020-09-11 (×3): 1 via TRANSDERMAL
  Filled 2020-09-08 (×9): qty 1

## 2020-09-08 NOTE — Progress Notes (Signed)
FPTS Interim Progress Note  S: Received page regarding patient having 8/10 back pain and threatening to "end it all." Went to bedside to assess patient, he endorses 8/10 low back pain that has been a chronic issue. States that at home he takes tylenol three times daily but has not been getting his medication here for some reason. Denies any other concerns but was very upset regarding this issue. States that he wants to leave, explained that this would be against medical advice and that we will work to resolve this. Patient states that he does not want to end it all but meant that he will leave the hospital and would rather sleep under a bridge but denies any intention to harm himself.   O: BP 105/70 (BP Location: Left Arm)   Pulse 68   Temp 98.1 F (36.7 C) (Oral)   Resp 17   Ht 5\' 8"  (1.727 m)   Wt 63.1 kg Comment: standing scale  SpO2 99%   BMI 21.15 kg/m   General: Patient sitting in bed, in no acute distress. Resp: normal WOB Neuro: AOx4, speaks in full sentences  Psych: denies both active and passive SI  A/P: -lidocaine patch -K pad -voltaren gel -tylenol 650 mg given around 9am, tylenol 1000 mg bid (2/20) -tylenol 1000 mg tid starting 2/21 -no sitter needed at this time -called nurse to ensure that patient receives tylenol as ordered    Donney Dice, DO 09/08/2020, 9:50 AM PGY-1, [place] Medicine Service pager [phone]

## 2020-09-08 NOTE — Evaluation (Signed)
Clinical/Bedside Swallow Evaluation Patient Details  Name: KARANVIR BALDERSTON MRN: [id number] Date of Birth: 08-03-1955  Today's Date: 09/08/2020 Time: SLP Start Time (ACUTE ONLY): 0940 SLP Stop Time (ACUTE ONLY): 0957 SLP Time Calculation (min) (ACUTE ONLY): 17 min  Past Medical History:  Past Medical History:  Diagnosis Date  . Actinic keratosis 11/27/2016  . Alcohol abuse with alcohol-induced mood disorder ([place]) 07/18/2018  . Alcohol use disorder, severe, dependence ([place]) 09/16/2006   05/22/2015 Argumentative, belligerent and verbally abusive to staff per his note from [place]   . Alcoholism ([place])   . Anxiety state 04/25/2018  . Chronic low back pain 09/16/2006   Followed by NS.  Per his chart from [place], he fell off two storyhouse roof while cleaning his brother's gutters and sustained compression fracture of L3, L4 and L5 and fracture of right transverse process at L3 and nondisplaced fracture of left glenoid rim many years ago. He had multiple imaging in [place] including Lumbar MRI in 2016 which showed moderate to severe spinal canal stenos  . Delirium tremens ([place]) 09/01/2017  . Depression   . Dry eye 11/23/2016  . Hiatal hernia 09/14/2016   Status Collis gastroplasty and Nissen's fundoplication on 20/25/4270 in [place]  . History of delirium tremons 07/22/2020   DTs during 10/2016 08/2017. And 12/2017 admissions   . History of pulmonary embolus (PE) 11/02/2016   Unprovoked 11/01/2016. Xarelto from 11/01/16 to 09/08/2017.  Marland Kitchen Hydrocele    Surgically corrected  . Hypertension   . Hypoalbuminemia due to protein-calorie malnutrition ([place]) 07/22/2020  . Lumbar compression fracture ([place])   . Multiple rib fractures 07/22/2019   Left rib details XR: left ribs demonstrate multiple remote rib  . Pancytopenia ([place])   . Rhabdomyolysis 07/22/2020  . Skin cancer    exciced 2017  . Tinea versicolor 06/08/2013  . Tobacco use disorder 07/22/2020  . Tooth infection 04/25/2018  . Trigger  finger, acquired 11/18/2012  . Ulnar tunnel syndrome of right wrist 11/08/2012   Past Surgical History:  Past Surgical History:  Procedure Laterality Date  . CATARACT EXTRACTION    . HIATAL HERNIA REPAIR  2016  . HYDROCELE EXCISION / REPAIR  2010  . SKIN CANCER EXCISION Left    Excised 2017   HPI:  Pt is a 65 y.o. male presenting with AMS, urosepsis, and A Fib in RVR. Imaging without acute cardiopulmonary or intracranial findings. Initial BSE completed this admission on 1/26, at which time swallowing appeared to be grossly functional given lack of dentition, but pt reported that hard foods made his gums bleed. He was therefore started on a mechanical soft diet, but SLP was reordered when pt asked to upgrade to regular solids. PMH is significant for A Fib, alcohol use disorder with history of DT, HFpEF, HTN, chronic back pain, anxiety, depression, right PE, HH (s/p repair in 2016).   Assessment / Plan / Recommendation Clinical Impression  Pt's oropharyngeal swallowing appears to be [facility]. He is frustrated and very distractible throughout testing and has limited dentition, but he does not have any overt signs of dysphagia nor aspiration. He does acknowledge that with the poor condition of his dentition that he does not like certain foods/temperatures, but it may help him to have a full range of the menu to select what he typically wants to eat. Will advance diet to regular solids and thin liquids. No further SLP f/u indicated at this time. SLP Visit Diagnosis: Dysphagia, unspecified (R13.10)    Aspiration Risk  No limitations  Diet Recommendation Regular;Thin liquid   Liquid Administration via: Cup;Straw Medication Administration: Whole meds with liquid Supervision: Patient able to self feed;Intermittent supervision to cue for compensatory strategies Compensations: Minimize environmental distractions;Slow rate;Small sips/bites Postural Changes: Seated upright at 90 degrees    Other   Recommendations Oral Care Recommendations: Oral care BID   Follow up Recommendations 24 hour supervision/assistance      Frequency and Duration            Prognosis Prognosis for Safe Diet Advancement: Good Barriers to Reach Goals: Cognitive deficits      Swallow Study   General HPI: Pt is a 65 y.o. male presenting with AMS, urosepsis, and A Fib in RVR. Imaging without acute cardiopulmonary or intracranial findings. Initial BSE completed this admission on 1/26, at which time swallowing appeared to be grossly functional given lack of dentition, but pt reported that hard foods made his gums bleed. He was therefore started on a mechanical soft diet, but SLP was reordered when pt asked to upgrade to regular solids. PMH is significant for A Fib, alcohol use disorder with history of DT, HFpEF, HTN, chronic back pain, anxiety, depression, right PE, HH (s/p repair in 2016). Type of Study: Bedside Swallow Evaluation Previous Swallow Assessment: see HPI Diet Prior to this Study: Dysphagia 3 (soft);Thin liquids Temperature Spikes Noted: No Respiratory Status: Room air History of Recent Intubation: No Behavior/Cognition: Cooperative;Alert;Requires cueing Oral Care Completed by SLP: No Oral Cavity - Dentition: Poor condition;Missing dentition Vision: Functional for self-feeding Self-Feeding Abilities: Able to feed self Patient Positioning: Upright in bed Baseline Vocal Quality: Normal    Oral/Motor/Sensory Function     Ice Chips Ice chips: Not tested   Thin Liquid Thin Liquid: Within functional limits Presentation: Self Fed    Nectar Thick Nectar Thick Liquid: Not tested   Honey Thick Honey Thick Liquid: Not tested   Puree Puree: Not tested   Solid     Solid: Within functional limits      [name]., M.A. [place] Pager [phone] Office [phone]  09/08/2020,10:00 AM

## 2020-09-08 NOTE — Progress Notes (Signed)
Patient is complaining of 8/10 pain, Tylenol given but he says it does not cover his pain at all. He also stated that he would "Order a [employer] and end it all right now." MD paged and made aware. Will continue to monitor.

## 2020-09-09 DIAGNOSIS — I4891 Unspecified atrial fibrillation: Secondary | ICD-10-CM | POA: Diagnosis not present

## 2020-09-09 LAB — BASIC METABOLIC PANEL
Anion gap: 8 (ref 5–15)
BUN: 18 mg/dL (ref 8–23)
CO2: 24 mmol/L (ref 22–32)
Calcium: 9.5 mg/dL (ref 8.9–10.3)
Chloride: 108 mmol/L (ref 98–111)
Creatinine, Ser: 0.78 mg/dL (ref 0.61–1.24)
GFR, Estimated: >60 mL/min (ref >60)
Glucose, Bld: 89 mg/dL (ref 70–99)
Potassium: 4.5 mmol/L (ref 3.5–5.1)
Sodium: 140 mmol/L (ref 135–145)

## 2020-09-09 LAB — MAGNESIUM: Magnesium: 1.7 mg/dL (ref 1.7–2.4)

## 2020-09-09 MED ORDER — IBUPROFEN 400 MG PO TABS: 400.0000 mg | ORAL_TABLET | Freq: Four times a day (QID) | ORAL | Status: DC | PRN

## 2020-09-09 MED ORDER — MUSCLE RUB 10-15 % EX CREA
TOPICAL_CREAM | CUTANEOUS | Status: DC | PRN
  Filled 2020-09-09 (×2): qty 85

## 2020-09-09 MED ORDER — CAPSAICIN 0.025 % EX CREA
TOPICAL_CREAM | Freq: Two times a day (BID) | CUTANEOUS | Status: DC
  Administered 2020-09-10: 1 via TOPICAL
  Filled 2020-09-09 (×3): qty 60

## 2020-09-09 MED ORDER — DICLOFENAC SODIUM 1 % EX GEL
2.0000 g | Freq: Four times a day (QID) | CUTANEOUS | Status: DC | PRN
  Administered 2020-09-15 – 2020-09-24 (×10): 2 g via TOPICAL
  Filled 2020-09-09: qty 100

## 2020-09-09 NOTE — Progress Notes (Signed)
Family Medicine Teaching Service Daily Progress Note Intern Pager: [phone]  Patient name: Anthony [name] Medical record number: [id number] Date of birth: [birth year] Age: 65 y.o. Gender: male  Primary Care Provider: Carollee Leitz, MD Consultants: Cardiology (s/o) Code Status: Full  Pt Overview and Major Events to Date:  1/25 admitted 1/29 medically stable for discharge  Assessment and Plan: Anthony [name] a 65 y.o.malewho presented with urosepsis and A-Fib w/RVRdue to medication non-adherence, now resolved. PMH is significant forA Fib, alcohol use disorder, HFpEF, HTN, chronic back pain, anxiety, depression, right PE.  Patient remains medically stable to discharge to ALF.  Difficult Placement Patient remains medically stable for discharge and has been since 1/29.  Given his cognitive deficits, will need to go to an assisted living facility.  Given a history of prior alcohol use disorder and legal issues there have been difficulties with placement.  Per social work note on 2/18, referral was made to [facility], [facility], and [facility].  Social work continues to follow. -Social work consulted, appreciate recommendations and assistance  Chronic Back Pain This is patient's only complaint today.  Reports frustration with changing doses of Tylenol and inability to have a muscle relaxer with this Tylenol, which apparently previously worked at home.  Reports that Tylenol does only help him with takes muscle laxer with it.  Has tried ibuprofen but "feels as though I did not even take it".  Says that he is not requesting a muscle relaxer but he does not understand why he cannot have one. -Tylenol 1000 mg scheduled 3 times daily -Ibuprofen 400 mg every 6 as needed, can alternate with Tylenol -Lidocaine patch every 24 -Capsaicin cream twice daily as needed -Voltaren gel 4 times daily as needed -Muscle rub cream as needed -Heat and ice as necessary  Xerosis: Stable -Daily  moisturizing lotion -Lotion twice daily -Atarax 5 mg twice daily as needed  Recurrent Hypomagnesemia Magnesium today 1.7.  Weekly labs at this point. -Continue mag chloride 64 mg daily  Paroxysmal A-Fib: Chronic, stable Heart loss 24 hours 70-85. -Continue Metoprolol 25mg  daily, amiodarone 200mg  daily and Xarelto 20mg  daily  Alcohol Use Disorder: Chronic, stable -Daily thiamine, folic acid, and multivitamin  MDD  Anxiety: Chronic, stable -Continue Sertraline 200mg  daily  Malnutrition -Continue ensure/magic cup supplementation per nutrition    FEN/GI: Regular diet PPx: Xarelto 20 mg daily   Status is: Inpatient  Remains inpatient appropriate because:Awaiting placement   Dispo:  Patient From: Home  Planned Disposition: ALF  Expected discharge date: 09/16/2020  Medically stable for discharge: Yes  Difficult to place patient: Yes    Subjective:  Patient found sitting upright in bed, awake alert and oriented.  He is watching TV comfortably.  Chronic back pain is patient's only complaint today.  Reports frustration with changing doses of Tylenol and inability to have a muscle relaxer with this Tylenol, which apparently previously worked at home.  Reports that Tylenol does only help him with takes muscle laxer with it.  Has tried ibuprofen but "feels as though I did not even take it". Says that he is not requesting a muscle relaxer but he does not understand why he cannot have one.  Objective: Temp:  [97.7 F (36.5 C)-98.3 F (36.8 C)] 97.7 F (36.5 C) (02/21 0606) Pulse Rate:  [70-85] 70 (02/21 0606) Resp:  [18-19] 18 (02/21 0606) BP: (90-110)/(66-77) 92/72 (02/21 0606) SpO2:  [93 %-98 %] 98 % (02/21 0606)   Physical Exam General: Awake, alert, oriented, no acute  distress Respiratory: Unlabored respirations, speaking in full sentences, no respiratory distress Extremities: Moving all extremities spontaneously Neuro: Cranial nerves II through X grossly  intact Psych: Normal insight and judgement   Laboratory: No results for input(s): WBC, HGB, HCT, PLT in the last 168 hours. Recent Labs  Lab 09/09/20 0514  NA 140  K 4.5  CL 108  CO2 24  BUN 18  CREATININE 0.78  CALCIUM 9.5  GLUCOSE 89    Imaging/Diagnostic Tests: None last 24 hours.   [name], MD 09/09/2020, 8:27 AM PGY-1, [place] Intern pager: [phone], text pages welcome

## 2020-09-10 DIAGNOSIS — I4891 Unspecified atrial fibrillation: Secondary | ICD-10-CM | POA: Diagnosis not present

## 2020-09-10 DIAGNOSIS — G8929 Other chronic pain: Secondary | ICD-10-CM | POA: Diagnosis not present

## 2020-09-10 DIAGNOSIS — M545 Low back pain, unspecified: Secondary | ICD-10-CM | POA: Diagnosis not present

## 2020-09-10 MED ORDER — INFLUENZA VAC A&B SA ADJ QUAD 0.5 ML IM PRSY
0.5000 mL | PREFILLED_SYRINGE | INTRAMUSCULAR | Status: AC
  Administered 2020-09-12: 0.5 mL via INTRAMUSCULAR
  Filled 2020-09-10: qty 0.5

## 2020-09-10 MED ORDER — PNEUMOCOCCAL VAC POLYVALENT 25 MCG/0.5ML IJ INJ
0.5000 mL | INJECTION | INTRAMUSCULAR | Status: DC
  Filled 2020-09-10: qty 0.5

## 2020-09-10 NOTE — Plan of Care (Signed)
  Problem: Education: Goal: Knowledge of General Education information will improve Description: Including pain rating scale, medication(s)/side effects and non-pharmacologic comfort measures Outcome: Progressing   Problem: Clinical Measurements: Goal: Will remain free from infection Outcome: Progressing Goal: Cardiovascular complication will be avoided Outcome: Progressing   Problem: Activity: Goal: Risk for activity intolerance will decrease Outcome: Progressing   Problem: Nutrition: Goal: Adequate nutrition will be maintained Outcome: Progressing   Problem: Elimination: Goal: Will not experience complications related to bowel motility Outcome: Progressing Goal: Will not experience complications related to urinary retention Outcome: Progressing   Problem: Pain Managment: Goal: General experience of comfort will improve Outcome: Progressing   Problem: Safety: Goal: Ability to remain free from injury will improve Outcome: Progressing   Problem: Skin Integrity: Goal: Risk for impaired skin integrity will decrease Outcome: Progressing   Problem: Education: Goal: Knowledge of disease or condition will improve Outcome: Progressing Goal: Understanding of medication regimen will improve Outcome: Progressing Goal: Individualized Educational Video(s) Outcome: Progressing   Problem: Activity: Goal: Ability to tolerate increased activity will improve Outcome: Progressing   Problem: Cardiac: Goal: Ability to achieve and maintain adequate cardiopulmonary perfusion will improve Outcome: Progressing   Problem: Health Behavior/Discharge Planning: Goal: Ability to safely manage health-related needs after discharge will improve Outcome: Progressing

## 2020-09-10 NOTE — Progress Notes (Addendum)
Family Medicine Teaching Service Daily Progress Note Intern Pager: [phone]  Patient name: Anthony [name] Medical record number: [id number] Date of birth: October 03, 1955 Age: 65 y.o. Gender: male  Primary Care Provider: Carollee Leitz, MD Consultants: Cardiology (s/o) Code Status: Full  Pt Overview and Major Events to Date:  1/25 admitted 1/29 medically stable for discharge  Assessment and Plan: Anthony A Littleis a 65 y.o.malewho presented with urosepsis and A-Fib w/RVRdue to medication non-adherence, now resolved. PMH is significant forA Fib, alcohol use disorder, HFpEF, HTN, chronic back pain, anxiety, depression, right PE.  Patient remains medically stable to discharge to ALF.  Difficult Placement Patient remains medically stable for discharge and has been since 1/29.  Given his cognitive deficits, will need to go to an assisted living facility.  Given a history of prior alcohol use disorder and legal issues there have been difficulties with placement.  Per social work note on 2/18, referral was made to [facility], [place], and [facility].  Social work continues to follow. -Social work consulted, appreciate recommendations and assistance -Per Education officer, museum on floor, patient has not yet been deemed difficult placement.  Dr. Owens Shark to email Dr. Elesa Massed and Dr. Andee Poles regarding officially making this distinction. -This morning, patient requests to speak with social worker regarding placement options  Vaccines This morning, patient requests flu and pneumonia vaccines.  Was informed vaccines are usually done on discharge. -Flu and pneumonia vaccine scheduled for 10 AM on 2/23  Chronic Back Pain Does not complain of today.  For future reference, if continues to have severe back pain on the below medications, can try baclofen 5 mg twice daily. -Tylenol 1000 mg scheduled 3 times daily -Lidocaine patch every 24 -Capsaicin cream twice daily as needed -Voltaren gel 4 times daily as  needed -Muscle rub cream as needed -Heat and ice as necessary  Xerosis: Stable -Daily moisturizing lotion -Lotion twice daily -Atarax 5 mg twice daily as needed  Recurrent Hypomagnesemia Magnesium on 2/21 was 1.7.  Weekly labs at this point. -Continue mag chloride 64 mg daily  Paroxysmal A-Fib: Chronic, stable -Continue Metoprolol 25mg  daily, amiodarone 200mg  daily and Xarelto 20mg  daily  Alcohol Use Disorder: Chronic, stable -Daily thiamine, folic acid, and multivitamin  MDD  Anxiety: Chronic, stable -Continue Sertraline 200mg  daily  Malnutrition -Continue ensure/magic cup supplementation per nutrition    FEN/GI: Regular diet PPx: Xarelto 20 mg daily   Status is: Inpatient  Remains inpatient appropriate because:Awaiting placement   Dispo:  Patient From: Home  Planned Disposition: ALF  Expected discharge date: 09/12/2020  Medically stable for discharge: Yes  Difficult to place patient: Yes    Subjective:  Patient found sitting on the bed with his slippers on watching TV.  He reports doing well overnight.  Today he requests to receive the flu and pneumonia vaccines.  He would also like to speak with the social worker regarding his placement options.  He asks again if he can go to behavioral health and we informed him that that would not quite be an appropriate placement for him given what he needs currently and lack of SI/HI.  He requests to speak with a psychiatrist regardless.  Lastly, a nurse found reading glasses and the loss of abdomen and gave it to him upon his request.  He reports that these glasses are not the right prescription" likely need to be stronger".  He requests [place] to find stronger prescription glasses for him to help him read.  Objective: Temp:  [97.7 F (36.5  C)-97.9 F (36.6 C)] 97.7 F (36.5 C) (02/22 0448) Pulse Rate:  [74-85] 76 (02/22 0944) Resp:  [16-18] 16 (02/22 0448) BP: (102-112)/(71-83) 102/73 (02/22 0944) SpO2:  [98 %] 98 %  (02/22 0448) Weight:  [66.7 kg] 66.7 kg (02/22 0448)   Physical Exam General: Awake, alert, oriented, no acute distress Respiratory: Unlabored respirations, speaking in full sentences, no respiratory distress Extremities: Moving all extremities spontaneously Neuro: Cranial nerves II through X grossly intact Psych: Normal insight and judgement   Laboratory: No results for input(s): WBC, HGB, HCT, PLT in the last 168 hours. Recent Labs  Lab 09/09/20 0514  NA 140  K 4.5  CL 108  CO2 24  BUN 18  CREATININE 0.78  CALCIUM 9.5  GLUCOSE 89    Imaging/Diagnostic Tests: None last 24 hours.   [name], MD 09/10/2020, 3:09 PM PGY-1, [place] Intern pager: [phone], text pages welcome

## 2020-09-10 NOTE — TOC Progression Note (Signed)
Transition of Care [facility]) - Progression Note    Patient Details  Name: Anthony [name] MRN: [id number] Date of Birth: 04-22-1956  Transition of Care [facility]) CM/SW [place], [place] Phone Number: 09/10/2020, 4:26 PM  Clinical Narrative:     CSW has no updates on placement at this time.   Expected Discharge Plan: [place] Barriers to Discharge: Continued Medical Work up  Expected Discharge Plan and Services Expected Discharge Plan: [place] arrangements for the past 2 months: Hotel/Motel                                       Social Determinants of Health (SDOH) Interventions    Readmission Risk Interventions No flowsheet data found.  Emeterio Reeve, Latanya Presser, [place] Social Worker [phone]

## 2020-09-11 DIAGNOSIS — I4891 Unspecified atrial fibrillation: Secondary | ICD-10-CM | POA: Diagnosis not present

## 2020-09-11 MED ORDER — CALCIUM CARBONATE ANTACID 500 MG PO CHEW
1.0000 | CHEWABLE_TABLET | Freq: Three times a day (TID) | ORAL | Status: DC | PRN
  Administered 2020-09-15: 200 mg via ORAL
  Filled 2020-09-11: qty 1

## 2020-09-11 NOTE — Progress Notes (Signed)
Family Medicine Teaching Service Daily Progress Note Intern Pager: [phone]  Patient name: Anthony [name] Medical record number: [id number] Date of birth: 09/11/1955 Age: 65 y.o. Gender: male  Primary Care Provider: Carollee Leitz, MD Consultants: Cardiology (s/o) Code Status: Full  Pt Overview and Major Events to Date:  1/25 admitted 1/29 medically stable for discharge  Assessment and Plan: Anthony A Littleis a 65 y.o.malewho presented with urosepsis and A-Fib w/RVRdue to medication non-adherence, now resolved. PMH is significant forA Fib, alcohol use disorder, HFpEF, HTN, chronic back pain, anxiety, depression, right PE. Patient continues to be medically stable for discharge to SNF.  Difficult Placement Patient remains medically stable for discharge and has been since 1/29.  Given his cognitive deficits, will in the middle of assisted living facility.  PT/OT recommended SNF.  Difficult placement due to history of prior alcohol use disorder and legal issues.  Per social work note on 2/18 referral medicine [place], [place], [place].  Social work note on 2/22 demonstrates no updates as of yet. 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  Reports this is quite bothersome.  Denies history of lung diagnoses.  Has never used inhaler for SOB.  Likely due to deconditioning. -Encourage PT participation -Encourage ambulation throughout day and being up out of bed  Chronic Back Pain -Tylenol 1000 mg scheduled 3 times daily -Lidocaine patch every 24 -Capsaicin cream twice daily as needed -Voltaren gel 4 times daily as needed -Muscle rub cream as needed -Heat and ice as necessary -If continues to experience severe back pain, can prescribe baclofen 5 mg BID  Xerosis: Stable -Daily moisturizing lotion -Lotion twice daily -Atarax 5 mg twice  daily as needed  Recurrent Hypomagnesemia Magnesium on 2/21 was 1.7.  Weekly labs at this point. -Continue mag chloride 64 mg daily  Paroxysmal A-Fib: Chronic, stable -Continue Metoprolol 25mg  daily, amiodarone 200mg  daily and Xarelto 20mg  daily  Alcohol Use Disorder: Chronic, stable -Daily thiamine, folic acid, and multivitamin  MDD  Anxiety: Chronic, stable -Continue Sertraline 200mg  daily  Malnutrition -Continue ensure/magic cup supplementation per nutrition   FEN/GI: Regular diet PPx: Xarelto 20 mg daily   Status is: Inpatient  Remains inpatient appropriate because:Awaiting ALF placement   Dispo:  Patient From: Home  Planned Disposition: ALF  Expected discharge date: 09/16/2020  Medically stable for discharge: Yes  Difficult to place patient: Yes  Subjective:  Patient found awake and reclined in bed, appears comfortable.  Awake alert.  Says that his main problem is his breathing, that he gets short of breath whenever he gets up and exerts himself such as when he uses a washcloth to bathe himself.  He reports that is his biggest problem and that it is very concerning to him.  Denies ever being diagnosed with lung pathology such as COPD, emphysema, asthma.  Denies ever using an inhaler at home for shortness of breath.  Objective: Temp:  [97.5 F (36.4 C)-98.3 F (36.8 C)] 98.3 F (36.8 C) (02/23 1429) Pulse Rate:  [72-87] 87 (02/23 1429) Resp:  [18-20] 18 (02/23 1429) BP: (99-118)/(64-87) 99/64 (02/23 1429) SpO2:  [98 %-100 %] 98 % (02/23 1429) Weight:  [67.2 kg] 67.2 kg (02/23 0509)  Physical Exam General: Awake, alert, oriented Cardiovascular: Regular rate and rhythm, S1 and S2 present, no murmurs auscultated Respiratory: Lung fields clear to auscultation bilaterally Neuro: Cranial nerves II through X  grossly intact, able to move all extremities spontaneously   Laboratory: No results for input(s): WBC, HGB, HCT, PLT in the last 168 hours. Recent Labs   Lab 09/09/20 0514  NA 140  K 4.5  CL 108  CO2 24  BUN 18  CREATININE 0.78  CALCIUM 9.5  GLUCOSE 89    Imaging/Diagnostic Tests: None last 24 hours.   [name], MD 09/11/2020, 2:35 PM PGY-1, [place] Intern pager: [phone], text pages welcome

## 2020-09-11 NOTE — Progress Notes (Signed)
Occupational Therapy Treatment Patient Details Name: Anthony [name] MRN: [id number] DOB: 1956/06/11 Today's Date: 09/11/2020    History of present illness Pt is a 65 y.o. male admitted 08/13/20 after being found by social worker in his hotel room naked, incontinent of urine/feces with AMS; EMS noted tachycardia and hypotension. Workup for urosepsis, afib with RVR. CT/MRI negative for acute intracranial abnormality. PMH includes afib, ETOH use, HTN, HF, chronic back pain, anxiety, depression.   OT comments  Pt received in bathroom completing toileting and standing at sink to wash clothes. Pt reports he has a system of washing clothes in sink and hanging items to dry in bathroom, although pt declined this COTA observing the task. Pt completed household distance functional mobility with no AD and gross supervision. Pt presents mostly with cognitive impairments impacting pts ability to complete BADLs independently. Issued pt written HEP for BUE therex with level 1 theraband although pt declined participating in therex but reports he would do it later. Provided visual demo of all therex, however pt would benefit from further demonstration and participation next session. Pt would continue to benefit from skilled occupational therapy while admitted and after d/c to address the below listed limitations in order to improve overall functional mobility and facilitate independence with BADL participation. DC plan remains appropriate, will follow acutely per POC.    Follow Up Recommendations  SNF    Equipment Recommendations  None recommended by OT    Recommendations for Other Services      Precautions / Restrictions Precautions Precautions: Fall Restrictions Weight Bearing Restrictions: No       Mobility Bed Mobility Overal bed mobility: Modified Independent Bed Mobility: Supine to Sit;Sit to Supine     Supine to sit: Modified independent (Device/Increase time) Sit to supine: Modified independent  (Device/Increase time)   General bed mobility comments: no physical assist needed, use of bed rails and elevated HOB    Transfers Overall transfer level: Modified independent Equipment used: None Transfers: Sit to/from Stand Sit to Stand: Modified independent (Device/Increase time)         General transfer comment: increased effort d/t back pain    Balance Overall balance assessment: Needs assistance Sitting-balance support: Feet supported;No upper extremity supported Sitting balance-Leahy Scale: Good       Standing balance-Leahy Scale: Fair Standing balance comment: standing at sink with no UE support with no LOB                           ADL either performed or assessed with clinical judgement   ADL Overall ADL's : Needs assistance/impaired       Grooming Details (indicate cue type and reason): pt standing at sink washing clothes in sink with no LOB or UE support                 Toilet Transfer: Supervision/safety;Ambulation Toilet Transfer Details (indicate cue type and reason): pt recieved in BR completing toileting with no AD, supervision for safety however no LOB noted         Functional mobility during ADLs: Supervision/safety General ADL Comments: pt presents mostly with cognitive impairments and decreased safety awareness. pt recieved finishing toileting and standing at sink washing clothes in sink     Vision       Perception     Praxis      Cognition Arousal/Alertness: Awake/alert Behavior During Therapy: WFL for tasks assessed/performed Overall Cognitive Status: Within Functional Limits for tasks assessed Area  of Impairment: Orientation;Attention;Memory;Following commands;Safety/judgement;Awareness;Problem solving                 Orientation Level: Disoriented to;Time (states he just finished dinner( it was breakfast time)) Current Attention Level: Selective Memory: Decreased short-term memory (pt states he wants to  finish breakfast but then later states he is "all done") Following Commands: Follows one step commands consistently;Follows multi-step commands with increased time Safety/Judgement: Decreased awareness of safety;Decreased awareness of deficits Awareness: Emergent Problem Solving: Slow processing;Requires verbal cues General Comments: pt appropriate during session but disorineted to time of day and noted to present with some STM deficits as provided conflicting responses throughout session        Exercises     Shoulder Instructions       General Comments issued pt level 1 theraband and written HEP however pt declined completing therex with COTA, visually demo'ed all exercies and provided handout to increase carryover    Pertinent Vitals/ Pain       Pain Assessment: Faces Faces Pain Scale: Hurts a [place] bit Pain Location: chronic back pain Pain Descriptors / Indicators: Sore Pain Intervention(s): Monitored during session  Home Living                                          Prior Functioning/Environment              Frequency  Min 1X/week        Progress Toward Goals  OT Goals(current goals can now be found in the care plan section)  Progress towards OT goals: Progressing toward goals  Acute Rehab OT Goals Patient Stated Goal: to find a place to live OT Goal Formulation: With patient Time For Goal Achievement: 09/18/20 (updated goal date based on OTR last visit) Potential to Achieve Goals: [place] Discharge plan remains appropriate;Frequency remains appropriate    Co-evaluation                 AM-PAC OT "6 Clicks" Daily Activity     Outcome Measure   Help from another person eating meals?: None Help from another person taking care of personal grooming?: None Help from another person toileting, which includes using toliet, bedpan, or urinal?: A [place] Help from another person bathing (including washing, rinsing, drying)?: A  [place] Help from another person to put on and taking off regular upper body clothing?: None Help from another person to put on and taking off regular lower body clothing?: None 6 Click Score: 22    End of Session    OT Visit Diagnosis: Unsteadiness on feet (R26.81);Other symptoms and signs involving cognitive function   Activity Tolerance Patient tolerated treatment well   Patient Left in bed;with call bell/phone within reach   Nurse Communication Mobility status        Time: 0902-0911 OT Time Calculation (min): 9 min  Charges: OT General Charges $OT Visit: 1 Visit OT Treatments $Therapeutic Activity: 8-22 mins  Harley Alto., COTA/L Acute Rehabilitation Services [place] 09/11/2020, 10:59 AM

## 2020-09-11 NOTE — Progress Notes (Signed)
Family Medicine Teaching Service Daily Progress Note Intern Pager: [phone]  Patient name: Anthony [name] Medical record number: [id number] Date of birth: 1956/05/20 Age: 65 y.o. Gender: male  Primary Care Provider: Carollee Leitz, MD Consultants: Cardiology (s/o) Code Status: Full  Pt Overview and Major Events to Date:  1/25 admitted 1/29 medically stable for discharge  Assessment and Plan: Anthony A Littleis a 65 y.o.malewho presented with urosepsis and A-Fib w/RVRdue to medication non-adherence, now resolved. PMH is significant forA Fib, alcohol use disorder, HFpEF, HTN, chronic back pain, anxiety, depression, right PE.    Patient remains medically stable to discharge to ALF.  Difficult Placement Patient remains medically stable for discharge and has been since 1/29.  Given his cognitive deficits, will need to go to an assisted living facility.  Given a history of prior alcohol use disorder and legal issues there have been difficulties with placement.  Per social work note on 2/18, referral was made to [facility], [place], and [facility].  Social work continues to follow. -Social work consulted, appreciate recommendations and ongoing care -Dr. Owens Shark to email Dr. Elesa Massed and Dr. Andee Poles regarding "difficult placement" distinction  Breathing issues Patient reports no breathing issues while lying in bed.  However, he is bothered by increased work of breathing when exertion such as when he is up to shower.  Respiratory effort returns to normal upon rest.  Denies previous history of albuterol inhaler use.  Likely due to deconditioning. -May consider albuterol inhaler as needed  Vaccines Yesterday patient requested flu and pneumonia vaccines. -Flu and pneumonia vaccine scheduled for 10 AM on 2/23  Dyspepsia  Flatulence Patient today complains of dyspepsia due to "too much coffee". Also bothered by several days of increased flatulence. First day to report this.  - [place]  prn  Chronic Back Pain Patient frustrated this morning that he cannot take ibuprofen, despite telling me several days ago that this does not work for him at all.  Explained to him this was an error, and that he cannot take ibuprofen due to his concurrent Xarelto use.  For future reference, if continues to have severe back pain on the below medications, can try baclofen 5 mg twice daily. -Tylenol 1000 mg scheduled 3 times daily -Lidocaine patch every 24 -Capsaicin cream twice daily as needed -Voltaren gel 4 times daily as needed -Muscle rub cream as needed -Heat and ice as necessary  Xerosis: Stable -Daily moisturizing lotion -Lotion twice daily -Atarax 5 mg twice daily as needed  Recurrent Hypomagnesemia Magnesium on 2/21 was 1.7.  Weekly labs at this point. -Continue mag chloride 64 mg daily  Paroxysmal A-Fib: Chronic, stable -Continue Metoprolol 25mg  daily, amiodarone 200mg  daily and Xarelto 20mg  daily  Alcohol Use Disorder: Chronic, stable -Daily thiamine, folic acid, and multivitamin  MDD  Anxiety: Chronic, stable -Continue Sertraline 200mg  daily  Malnutrition -Continue ensure/magic cup supplementation per nutrition    FEN/GI: Regular diet PPx: Xarelto 20 mg daily   Status is: Inpatient  Remains inpatient appropriate because:Awaiting placement   Dispo:  Patient From: Home  Planned Disposition: ALF  Expected discharge date: 09/12/2020  Medically stable for discharge: Yes  Difficult to place patient: Yes    Subjective:  Patient found reclined comfortably in bed wearing T-shirt and boxers watching television.  Informed patient that he would be getting his flu and pneumonia vaccines as requested this morning around 10 AM.  He reports "the Solu-Medrol bolus anymore".  He now reports dyspepsia from too much coffee this morning and  increased flatulence for several days. Patient reports no breathing issues while lying in bed.  However, he is bothered by  increased work of breathing when exertion such as when he is up to shower.  Respiratory effort returns to normal upon rest.  Denies previous history of albuterol inhaler use.    Objective: Temp:  [97.5 F (36.4 C)-98.3 F (36.8 C)] 98.3 F (36.8 C) (02/23 0800) Pulse Rate:  [72-80] 79 (02/23 0800) Resp:  [18-20] 20 (02/23 0800) BP: (101-118)/(71-87) 101/71 (02/23 0800) SpO2:  [98 %-100 %] 99 % (02/23 0800) Weight:  [67.2 kg] 67.2 kg (02/23 0509)  Physical Exam General: Awake, alert, oriented, no acute distress Respiratory: Lung fields clear to auscultation bilaterally Neuro: Cranial nerves II through X grossly intact, able to move all extremities spontaneously   Laboratory: No results for input(s): WBC, HGB, HCT, PLT in the last 168 hours. Recent Labs  Lab 09/09/20 0514  NA 140  K 4.5  CL 108  CO2 24  BUN 18  CREATININE 0.78  CALCIUM 9.5  GLUCOSE 89    Imaging/Diagnostic Tests: None last 24 hours.   [name], MD 09/11/2020, 8:57 AM PGY-1, [place] Intern pager: [phone], text pages welcome

## 2020-09-12 DIAGNOSIS — I4891 Unspecified atrial fibrillation: Secondary | ICD-10-CM | POA: Diagnosis not present

## 2020-09-12 NOTE — Progress Notes (Signed)
Pt is not drinking supplemental drinks. He stated he "did not need to gain anymore weight with snacks between meals."

## 2020-09-12 NOTE — TOC Progression Note (Signed)
Transition of Care [facility]) - Progression Note    Patient Details  Name: Anthony [name] MRN: [id number] Date of Birth: 10/13/1955  [facility] of [facility] CM/SW [place], [place] Phone Number: 09/12/2020, 2:51 PM  Clinical Narrative:     CSW spoke to [facility] admission coordinator about ALF/ Family care home placement. At this time they are unable to offer a bed without pt having SSI/ disability. Admission coordinator stated referral can be resent once payor source has been established. No other bed offers at this time.   Expected Discharge Plan: [place] Barriers to Discharge: Continued Medical Work up  Expected Discharge Plan and Services Expected Discharge Plan: [place] arrangements for the past 2 months: Hotel/Motel                                       Social Determinants of Health (SDOH) Interventions    Readmission Risk Interventions No flowsheet data found.  Emeterio Reeve, Latanya Presser, [place] Social Worker [phone]

## 2020-09-13 DIAGNOSIS — E43 Unspecified severe protein-calorie malnutrition: Secondary | ICD-10-CM | POA: Diagnosis not present

## 2020-09-13 DIAGNOSIS — I4891 Unspecified atrial fibrillation: Secondary | ICD-10-CM | POA: Diagnosis not present

## 2020-09-13 DIAGNOSIS — Z659 Problem related to unspecified psychosocial circumstances: Secondary | ICD-10-CM | POA: Diagnosis not present

## 2020-09-13 MED ORDER — LORATADINE 10 MG PO TABS
10.0000 mg | ORAL_TABLET | Freq: Every day | ORAL | Status: DC
  Administered 2020-09-13 – 2020-10-09 (×27): 10 mg via ORAL
  Filled 2020-09-13 (×28): qty 1

## 2020-09-13 MED ORDER — LORATADINE 10 MG PO TABS: 10.0000 mg | ORAL_TABLET | Freq: Every day | ORAL | Status: DC

## 2020-09-13 NOTE — Progress Notes (Signed)
Left phone message for Ms. Miranda [place], . Her TOC progression note indicates that a barrier to Mr. Bedel ALF placement is continued medical work up. I want to clarify that Mr. Lina is currently medically stable for discharge to SNF and no further work up is indicated at this time.   Please contact our service at [phone] with any questions or concerns.   Thank you so much,  [name], MD

## 2020-09-13 NOTE — Progress Notes (Addendum)
Family Medicine Teaching Service Daily Progress Note Intern Pager: [phone]  Patient name: Anthony [name] Medical record number: [id number] Date of birth: 23-Feb-1956 Age: 65 y.o. Gender: male  Primary Care Provider: Carollee Leitz, MD Consultants: Cardiology, signed off Code Status: Full  Pt Overview and Major Events to Date:  1/25 admitted 1/29 medically stable for discharge  Assessment and Plan: Anthony [name] a 65 y.o.malewho presented with urosepsis and A-Fib w/RVRdue to medication non-adherence, now resolved. PMH is significant forA Fib, alcohol use disorder, HFpEF, HTN, chronic back pain, anxiety, depression, right PE.  Patient is medically stable for discharge.  Difficult Placement Patient remains medically stable for discharge and has been since 1/29.  Given his cognitive deficits, will in the middle of assisted living facility.  PT/OT recommended SNF.  Difficult placement due to history of prior alcohol use disorder and legal issues.  Per social work note on 2/18 referral medicine Anthony [name], Anthony [name], [place].  Social work note on 2/24 demonstrates no updates as of yet. 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 -Tylenol 1000 mg scheduled 3 times daily -Lidocaine patch every 24 -Capsaicin cream twice daily as needed -Voltaren gel 4 times daily as needed -Muscle rub cream as needed -Heat and ice as necessary -If continues to experience severe back pain, can prescribe baclofen 5 mg BID  Xerosis: Stable -Daily moisturizing lotion -Lotion twice daily -Atarax 5 mg twice daily as needed  Recurrent Hypomagnesemia Last week the lab indicated magnesium 1.7. -Continue mag chloride 64 mg daily  Paroxysmal A-Fib: Chronic, stable -Continue Metoprolol 25mg  daily, amiodarone 200mg  daily and Xarelto 20mg  daily  Alcohol Use Disorder: Chronic, stable -Daily thiamine, folic acid, and  multivitamin  MDD  Anxiety: Chronic, stable -Continue Sertraline 200mg  daily  Malnutrition -Continue ensure/magic cup supplementation per nutrition   FEN/GI: Regular diet PPx: Xarelto 20 mg daily   Status is: Inpatient  Remains inpatient appropriate because:awaiting placement   Dispo:  Patient From: Home  Planned Disposition: ALF  Medically stable for discharge: Yes  Difficult to place patient: Yes    Subjective:  Patient uncomfortable in bed, reclined.  Wearing T-shirt and boxers.  Reports that he ordered his breakfast at 630 did not get it until 9 AM.  Says that he would rather not eat if he has to wait that long.  Also continue to have back pain, but request Tylenol.  Reporting missed medications or taking days to get medications.  Objective: Temp:  [97.9 F (36.6 C)-98.2 F (36.8 C)] 98.1 F (36.7 C) (02/25 1637) Pulse Rate:  [85-88] 88 (02/25 1637) Resp:  [18-19] 18 (02/25 1637) BP: (103-112)/(76-82) 103/82 (02/25 1637) SpO2:  [98 %-100 %] 100 % (02/25 1637) Weight:  [67.6 kg] 67.6 kg (02/25 0431)  Physical Exam General: Awake, alert, oriented, no acute distress Respiratory: Unlabored respirations, speaking in full sentences, no respiratory distress Extremities: Moving all extremities spontaneously Neuro: Cranial nerves II through X grossly intact Psych: Normal insight and judgement   Laboratory: No results for input(s): WBC, HGB, HCT, PLT in the last 168 hours. Recent Labs  Lab 09/09/20 0514  NA 140  K 4.5  CL 108  CO2 24  BUN 18  CREATININE 0.78  CALCIUM 9.5  GLUCOSE 89     Imaging/Diagnostic Tests: None last 24 hours.   Anthony Essex, MD 09/13/2020, 5:31 PM PGY-1, [place] Intern pager: [phone], text pages welcome

## 2020-09-13 NOTE — Progress Notes (Addendum)
Family Medicine Teaching Service Daily Progress Note Intern Pager: [phone]  Patient name: Anthony [name] Medical record number: [id number] Date of birth: Mar 09, 1956 Age: 65 y.o. Gender: male  Primary Care Provider: Carollee Leitz, MD Consultants: Cardiology, signed off Code Status: Full  Pt Overview and Major Events to Date:  1/25 admitted 1/29 medically stable for discharge  Assessment and Plan: Anthony A Littleis a 65 y.o.malewho presented with urosepsis and A-Fib w/RVRdue to medication non-adherence, now resolved. PMH is significant forA Fib, alcohol use disorder, HFpEF, HTN, chronic back pain, anxiety, depression, right PE.  Patient is medically stable for discharge.  Difficult Placement Patient remains medically stable for discharge and has been since 1/29.  Given his cognitive deficits, is in need of assisted living facility.  PT/OT recommended SNF.  Difficult placement due to history of prior alcohol use disorder and legal issues.  Per social work note on 2/18 referral medicine [place], [place], [place].  Social work note on 2/24 demonstrates no updates as of yet. -Social work on Mining engineer, greatly appreciate the ongoing care of this patient  Chronic Back Pain States his back pain is about like usual. -Tylenol 1000 mg scheduled 3 times daily -Lidocaine patch every 24 -Capsaicin cream twice daily as needed -Voltaren gel 4 times daily as needed -Muscle rub cream as needed -Heat and ice as necessary -If continues to experience severe back pain, can prescribe baclofen 5 mg BID  Xerosis: Stable Patient states this has improved since starting the lotions. -Daily moisturizing lotion -Lotion twice daily -Atarax 5 mg twice daily as needed  Recurrent Hypomagnesemia Last week the lab indicated magnesium 1.7. -Continue mag chloride 64 mg daily - Recheck each Monday  Paroxysmal A-Fib: Chronic, stable Current heart rate 89. -Continue Metoprolol 25mg  daily, amiodarone 200mg  daily and  Xarelto 20mg  daily  Alcohol Use Disorder: Chronic, stable -Daily thiamine, folic acid, and multivitamin  MDD  Anxiety: Chronic, stable -Continue Sertraline 200mg  daily  Malnutrition -Continue ensure/magic cup supplementation per nutrition  FEN/GI: Regular diet PPx: Xarelto 20 mg daily  Status is: Inpatient  Remains inpatient appropriate because:awaiting placement   Dispo:  Patient From: Home  Planned Disposition: ALF  Medically stable for discharge: Yes  Difficult to place patient: Yes   Subjective:  Patient resting on my initial encounter.  Had no complaints upon waking up.  When asked about his back he states is about like usual.  He states that the review dry skin has improved with using new lotions.  He denies other complaints at this time.  Patient was very pleasant on exam.  Objective: Temp:  [97.8 F (36.6 C)-98.1 F (36.7 C)] 97.8 F (36.6 C) (02/25 2027) Pulse Rate:  [87-89] 89 (02/25 2027) Resp:  [18-19] 18 (02/25 2027) BP: (103-112)/(76-83) 103/83 (02/25 2027) SpO2:  [98 %-100 %] 100 % (02/25 2027) Weight:  [67.6 kg] 67.6 kg (02/25 0431)  Physical Exam General: Mechele Claude elderly male alert and oriented in no apparent distress Heart: S1, S2 present with no murmurs appreciated Lungs: CTA bilaterally, no wheezing Extremities: No lower extremity edema  Laboratory: No results for input(s): WBC, HGB, HCT, PLT in the last 168 hours. Recent Labs  Lab 09/09/20 0514  NA 140  K 4.5  CL 108  CO2 24  BUN 18  CREATININE 0.78  CALCIUM 9.5  GLUCOSE 89    Imaging/Diagnostic Tests: None last 24 hours.   Lurline Del, DO 09/13/2020, 9:36 PM PGY-2, [place] Intern pager: [phone], text pages welcome

## 2020-09-14 DIAGNOSIS — Z659 Problem related to unspecified psychosocial circumstances: Secondary | ICD-10-CM | POA: Diagnosis not present

## 2020-09-14 DIAGNOSIS — E43 Unspecified severe protein-calorie malnutrition: Secondary | ICD-10-CM | POA: Diagnosis not present

## 2020-09-14 NOTE — Progress Notes (Signed)
Family Medicine Teaching Service Daily Progress Note Intern Pager: [phone]  Patient name: Anthony [name] Medical record number: [id number] Date of birth: Sep 18, 1955 Age: 65 y.o. Gender: male  Primary Care Provider: Carollee Leitz, MD Consultants: Cardiology, signed off Code Status: Full  Pt Overview and Major Events to Date:  1/25 admitted 1/29 medically stable for discharge  Assessment and Plan: Anthony A Littleis a 65 y.o.malewho presented with urosepsis and A-Fib w/RVRdue to medication non-adherence, now resolved. PMH is significant forA Fib, alcohol use disorder, HFpEF, HTN, chronic back pain, anxiety, depression, right PE.  Patient is medically stable for discharge.  Difficult Placement Patient remains medically stable for discharge and has been since 1/29. Given his cognitive deficits, is in need of assisted living facility. PT/OT recommended SNF. Difficult placement due to history of prior alcohol use disorder and legal issues. Social work note on 2/26 with no bed offers to date. -Social work on board, greatly appreciate the ongoing care of this patient  Shortness of breath without hypoxia: Endorses SOB with moderate exertion. Denies chest pain or infectious symptoms. Hemodynamically stable with normal O2 sats. Normal cardiac and pulmonary exam. Suspect likely deconditioning vs anemia given last Hgb on 1/30 was noted to be 9.3. He does have history of afib however denies palpitations. Currently on Xarelto.  - obtain CBC, EKG and ambulatory pulse ox to further evaluate  - do not feel CXR is indicated at this time given lack of symptoms and normal pulm exam - if still anemic, consider iron studies to evaluate and consider starting supplementation if indicated  Chronic Back Pain Stable, at baseline. -Tylenol 1000 mg scheduled 3 times daily -Lidocaine patch every 24 -Capsaicin cream twice daily as needed -Voltaren gel 4 times daily as needed -Muscle rub cream as needed -Heat  and ice as necessary -If continues to experience severe back pain, can prescribe baclofen 5 mgBID  Xerosis: Stable -Daily moisturizing lotion -Lotion twice daily -Atarax 5 mg twice daily as needed  Recurrent Hypomagnesemia Last week the lab indicated magnesium 1.7. -Continue mag chloride 64 mg daily - Follow up labs every Monday  Paroxysmal A-Fib: Chronic, stable Current heart rate 80's. -Continue Metoprolol 25mg  daily, amiodarone 200mg  daily and Xarelto 20mg  daily  Alcohol Use Disorder: Chronic, stable -Daily thiamine, folic acid, and multivitamin  MDD  Anxiety: Chronic, stable -Continue Sertraline 200mg  daily  Malnutrition -Continue ensure/magic cup supplementation per nutrition  FEN/GI: Regular diet PPx: Xarelto 20 mg daily  Status is: Inpatient  Remains inpatient appropriate because:awaiting placement   Dispo:             Patient From: Home             Planned Disposition: ALF/SNF             Medically stable for discharge: Yes             Difficult to place patient: Yes   Subjective:  Patient awake this morning. Endorses shortness of breath when he walks around the building or down the street. This has developed since being in the hospital. Denies chest pain, palpitations, or infectious symptoms. Continues to complain of back pain and the tylenol isnt enough.   Objective: Temp:  [97.6 F (36.4 C)-98.3 F (36.8 C)] 97.6 F (36.4 C) (02/27 0522) Pulse Rate:  [83-84] 83 (02/27 0522) Resp:  [18] 18 (02/26 1944) BP: (104-107)/(74-77) 106/76 (02/27 0522) SpO2:  [97 %-98 %] 98 % (02/27 0522) Weight:  [67.4 kg] 67.4 kg (02/27 0522)  Physical  Exam: General: initially pleasant older male, sitting comfortably bed, well nourished, well developed, in no acute distress with non-toxic appearance CV: regular rate and rhythm without murmurs, rubs, or gallops, no lower extremity edema, 2+ radial and pedal pulses bilaterally Lungs: clear to auscultation  bilaterally with normal work of breathing on room air, speaking in full sentences Skin: warm, dry Extremities: warm and well perfused Neuro: Alert and oriented, speech normal  Laboratory: No results for input(s): WBC, HGB, HCT, PLT in the last 168 hours. Recent Labs  Lab 09/09/20 0514  NA 140  K 4.5  CL 108  CO2 24  BUN 18  CREATININE 0.78  CALCIUM 9.5  GLUCOSE 89     Imaging/Diagnostic Tests: No results found.  Mina Marble [place], DO 09/15/2020, 6:25 AM PGY-3, [place] Intern pager: [phone], text pages welcome

## 2020-09-14 NOTE — TOC Progression Note (Signed)
Transition of Care [facility]) - Progression Note    Patient Details  Name: Anthony [name] MRN: [id number] Date of Birth: 04-20-1956  Transition of Care [facility]) CM/SW [place], [place] Phone Number: 09/14/2020, 3:21 PM  Clinical Narrative:     Barriers to discharge updated. No bed offers at this time.   Expected Discharge Plan: [place] Barriers to Discharge: Inadequate or no insurance,SNF Pending Medicaid,SNF Pending bed offer,SNF Pending payor source - LOG,Family Issues,Active Substance Use - Placement,Financial Resources,No SNF bed,Unsafe home situation  Expected Discharge Plan and Services Expected Discharge Plan: [place] arrangements for the past 2 months: Hotel/Motel                                       Social Determinants of Health (SDOH) Interventions    Readmission Risk Interventions No flowsheet data found.  Emeterio Reeve, Latanya Presser, [place] Social Worker [phone]

## 2020-09-15 DIAGNOSIS — F1020 Alcohol dependence, uncomplicated: Secondary | ICD-10-CM | POA: Diagnosis not present

## 2020-09-15 DIAGNOSIS — E43 Unspecified severe protein-calorie malnutrition: Secondary | ICD-10-CM | POA: Diagnosis not present

## 2020-09-15 DIAGNOSIS — I4891 Unspecified atrial fibrillation: Secondary | ICD-10-CM | POA: Diagnosis not present

## 2020-09-15 DIAGNOSIS — Z659 Problem related to unspecified psychosocial circumstances: Secondary | ICD-10-CM | POA: Diagnosis not present

## 2020-09-16 DIAGNOSIS — I4891 Unspecified atrial fibrillation: Secondary | ICD-10-CM | POA: Diagnosis not present

## 2020-09-16 DIAGNOSIS — Z20822 Contact with and (suspected) exposure to covid-19: Secondary | ICD-10-CM | POA: Diagnosis not present

## 2020-09-16 DIAGNOSIS — A4151 Sepsis due to Escherichia coli [E. coli]: Secondary | ICD-10-CM | POA: Diagnosis not present

## 2020-09-16 DIAGNOSIS — E43 Unspecified severe protein-calorie malnutrition: Secondary | ICD-10-CM | POA: Diagnosis not present

## 2020-09-16 DIAGNOSIS — G928 Other toxic encephalopathy: Secondary | ICD-10-CM | POA: Diagnosis not present

## 2020-09-16 LAB — BASIC METABOLIC PANEL
Anion gap: 9 (ref 5–15)
BUN: 18 mg/dL (ref 8–23)
CO2: 24 mmol/L (ref 22–32)
Calcium: 9.4 mg/dL (ref 8.9–10.3)
Chloride: 105 mmol/L (ref 98–111)
Creatinine, Ser: 0.74 mg/dL (ref 0.61–1.24)
GFR, Estimated: >60 mL/min (ref >60)
Glucose, Bld: 94 mg/dL (ref 70–99)
Potassium: 3.7 mmol/L (ref 3.5–5.1)
Sodium: 138 mmol/L (ref 135–145)

## 2020-09-16 LAB — CBC
HCT: 34.8 % — ABNORMAL LOW (ref 39.0–52.0)
Hemoglobin: 10.9 g/dL — ABNORMAL LOW (ref 13.0–17.0)
MCH: 32.3 pg (ref 26.0–34.0)
MCHC: 31.3 g/dL (ref 30.0–36.0)
MCV: 103.3 fL — ABNORMAL HIGH (ref 80.0–100.0)
Platelets: 268 10*3/uL (ref 150–400)
RBC: 3.37 MIL/uL — ABNORMAL LOW (ref 4.22–5.81)
RDW: 13.4 % (ref 11.5–15.5)
WBC: 7.9 10*3/uL (ref 4.0–10.5)
nRBC: 0 % (ref 0.0–0.2)

## 2020-09-16 LAB — MAGNESIUM: Magnesium: 1.7 mg/dL (ref 1.7–2.4)

## 2020-09-16 LAB — IRON AND TIBC
Iron: 34 ug/dL — ABNORMAL LOW (ref 45–182)
Saturation Ratios: 8 % — ABNORMAL LOW (ref 17.9–39.5)
TIBC: 438 ug/dL (ref 250–450)
UIBC: 404 ug/dL

## 2020-09-16 LAB — FERRITIN: Ferritin: 44 ng/mL (ref 24–336)

## 2020-09-16 MED ORDER — BACLOFEN 10 MG PO TABS
5.0000 mg | ORAL_TABLET | Freq: Three times a day (TID) | ORAL | Status: DC
  Administered 2020-09-16 – 2020-10-09 (×69): 5 mg via ORAL
  Filled 2020-09-16 (×70): qty 1

## 2020-09-16 MED ORDER — FERROUS SULFATE 325 (65 FE) MG PO TABS
325.0000 mg | ORAL_TABLET | Freq: Every day | ORAL | Status: DC
  Administered 2020-09-17 – 2020-10-09 (×22): 325 mg via ORAL
  Filled 2020-09-16 (×27): qty 1

## 2020-09-16 NOTE — Progress Notes (Signed)
Family Medicine Teaching Service Daily Progress Note Intern Pager: [phone]  Patient name: Anthony [name] Medical record number: [id number] Date of birth: Jan 07, 1956 Age: 65 y.o. Gender: male  Primary Care Provider: Carollee Leitz, MD Consultants: Cardiology (s/o) Code Status: Full  Pt Overview and Major Events to Date:  1/25 admitted 1/29 medically stable for discharge to supervised residential setting  Assessment and Plan: Mr. Batson is a 65 year old male who presented to the hospital with urosepsis and A. fib with RVR due to medication non-adherence; these have been treated and resolved. PMH is significant forA Fib, alcohol use disorder, HFpEF, HTN, chronic back pain, anxiety, depression, right PE.  Patient is medically stable for discharge to supervised residential setting.  Difficult Placement Patient remains medically stable for discharge and has been since 1/29.  Given his cognitive deficits is in need of assisted living facility.  No bed offers as of yet. -Social work consulted, appreciate ongoing care  Iron deficiency anemia  Shortness of breath without hypoxia Continues to endorse shortness of breath with moderate exertion.  CBC this morning demonstrates hemoglobin 10.9, improved from 9.3 on 1/30.  Weekly BMP today unremarkable.  EKG this morning normal sinus rhythm at 74 bpm, normal QTC at 475, normal axis, no ST elevation.  Likely due to deconditioning versus chronic anemia. -Awaiting ambulatory pulse ox -Iron studies completed, indicate iron deficiency anemia. Will start daily PO supplementation  Chronic Back Pain Continues to complain of back pain unrelieved by tylenol. Reports using "all the creams every day".  -Tylenol 1000 mg scheduled 3 times daily -Lidocaine patch every 24 -Capsaicin cream twice daily as needed -Voltaren gel 4 times daily as needed -Muscle rub cream as needed -Heat and ice as necessary -Started baclofen 5 mgBID today (2/28). If no improvement with  baclofen, will discontinue.   Xerosis: Stable -Daily moisturizing lotion -Lotion twice daily -Atarax 5 mg twice daily as needed  Recurrent Hypomagnesemia Magnesium 2/28 measures 1.7, stable from week prior. -Continue mag chloride 64 mg daily - Follow up labs every Monday  Paroxysmal A-Fib: Chronic, stable Current heart rate 70s-80s. -Continue Metoprolol 25mg  daily, amiodarone 200mg  daily and Xarelto 20mg  daily  Alcohol Use Disorder: Chronic, stable -Daily thiamine, folic acid, and multivitamin  MDD  Anxiety: Chronic, stable -Continue Sertraline 200mg  daily  Malnutrition -Continue ensure/magic cup supplementation per nutrition  FEN/GI: Regular diet PPx: Xarelto 20 mg daily   Status is: Inpatient  Remains inpatient appropriate because:Awaiting placement   Dispo:  Patient From: Home  Planned Disposition: ALF  Medically stable for discharge: Yes  Difficult to place patient: Yes    Subjective:  Patient laying in bed comfortably watching television. Patient lounging back wearing boxers, t shirt, and jacket. Complains of continues chronic back pain that is not relieved by tylenol. Strokes beard and laughs as he talks. Reports using "all the creams every day" without relief. Believes he waits an entire day before getting medications requested; MAR reflects otherwise. Asks "if someone had just gotten hit by a car, would you say 'sorry you're in pain, we'll get to that tomorrow' that wouldn't be acceptable." Note writer responds by saying we do triage pain relief needs by acuity and that the nurses are trying to get him the requested pain relief as soon as they are able, but patient keeps talking without acknowledgement.   Objective: Temp:  [98 F (36.7 C)-98.3 F (36.8 C)] 98 F (36.7 C) (02/28 0541) Pulse Rate:  [73-86] 73 (02/28 0541) Resp:  [17] 17 (02/28 0541) BP: (  100-106)/(71-77) 106/77 (02/28 0541) SpO2:  [96 %-98 %] 98 % (02/28 0541) Weight:  [67.9 kg] 67.9  kg (02/28 0533)   Physical Exam General: Awake, alert, oriented, no acute distress Respiratory: Unlabored respirations, speaking in full sentences, no respiratory distress Extremities: Moving all extremities spontaneously Neuro: Cranial nerves II through X grossly intact Psych: limited insight, impaired judgement   Laboratory: Recent Labs  Lab 09/16/20 0637  WBC 7.9  HGB 10.9*  HCT 34.8*  PLT 268   Recent Labs  Lab 09/16/20 0637  NA 138  K 3.7  CL 105  CO2 24  BUN 18  CREATININE 0.74  CALCIUM 9.4  GLUCOSE 94    Imaging/Diagnostic Tests: None last 24 hours.   [name], MD 09/16/2020, 7:49 AM PGY-1, [place] Intern pager: [phone], text pages welcome

## 2020-09-17 DIAGNOSIS — E43 Unspecified severe protein-calorie malnutrition: Secondary | ICD-10-CM | POA: Diagnosis present

## 2020-09-17 DIAGNOSIS — I4891 Unspecified atrial fibrillation: Secondary | ICD-10-CM | POA: Diagnosis present

## 2020-09-17 DIAGNOSIS — J189 Pneumonia, unspecified organism: Secondary | ICD-10-CM | POA: Diagnosis not present

## 2020-09-17 DIAGNOSIS — Z6820 Body mass index (BMI) 20.0-20.9, adult: Secondary | ICD-10-CM | POA: Diagnosis not present

## 2020-09-17 DIAGNOSIS — F1020 Alcohol dependence, uncomplicated: Secondary | ICD-10-CM | POA: Diagnosis not present

## 2020-09-17 DIAGNOSIS — Z5900 Homelessness unspecified: Secondary | ICD-10-CM | POA: Diagnosis not present

## 2020-09-17 DIAGNOSIS — G928 Other toxic encephalopathy: Secondary | ICD-10-CM | POA: Diagnosis not present

## 2020-09-17 DIAGNOSIS — F17290 Nicotine dependence, other tobacco product, uncomplicated: Secondary | ICD-10-CM | POA: Diagnosis present

## 2020-09-17 DIAGNOSIS — R059 Cough, unspecified: Secondary | ICD-10-CM | POA: Diagnosis not present

## 2020-09-17 DIAGNOSIS — R6520 Severe sepsis without septic shock: Secondary | ICD-10-CM | POA: Diagnosis present

## 2020-09-17 DIAGNOSIS — F329 Major depressive disorder, single episode, unspecified: Secondary | ICD-10-CM | POA: Diagnosis present

## 2020-09-17 DIAGNOSIS — E871 Hypo-osmolality and hyponatremia: Secondary | ICD-10-CM | POA: Diagnosis not present

## 2020-09-17 DIAGNOSIS — R7401 Elevation of levels of liver transaminase levels: Secondary | ICD-10-CM | POA: Diagnosis not present

## 2020-09-17 DIAGNOSIS — I482 Chronic atrial fibrillation, unspecified: Secondary | ICD-10-CM | POA: Diagnosis present

## 2020-09-17 DIAGNOSIS — Z20822 Contact with and (suspected) exposure to covid-19: Secondary | ICD-10-CM | POA: Diagnosis present

## 2020-09-17 DIAGNOSIS — E876 Hypokalemia: Secondary | ICD-10-CM | POA: Diagnosis present

## 2020-09-17 DIAGNOSIS — R64 Cachexia: Secondary | ICD-10-CM | POA: Diagnosis present

## 2020-09-17 DIAGNOSIS — Y95 Nosocomial condition: Secondary | ICD-10-CM | POA: Diagnosis not present

## 2020-09-17 DIAGNOSIS — Z1611 Resistance to penicillins: Secondary | ICD-10-CM | POA: Diagnosis present

## 2020-09-17 DIAGNOSIS — I2510 Atherosclerotic heart disease of native coronary artery without angina pectoris: Secondary | ICD-10-CM | POA: Diagnosis not present

## 2020-09-17 DIAGNOSIS — A4151 Sepsis due to Escherichia coli [E. coli]: Secondary | ICD-10-CM | POA: Diagnosis present

## 2020-09-17 DIAGNOSIS — E8809 Other disorders of plasma-protein metabolism, not elsewhere classified: Secondary | ICD-10-CM | POA: Diagnosis not present

## 2020-09-17 DIAGNOSIS — R931 Abnormal findings on diagnostic imaging of heart and coronary circulation: Secondary | ICD-10-CM | POA: Diagnosis not present

## 2020-09-17 DIAGNOSIS — N390 Urinary tract infection, site not specified: Secondary | ICD-10-CM | POA: Diagnosis present

## 2020-09-17 DIAGNOSIS — F411 Generalized anxiety disorder: Secondary | ICD-10-CM | POA: Diagnosis present

## 2020-09-17 DIAGNOSIS — R0600 Dyspnea, unspecified: Secondary | ICD-10-CM | POA: Diagnosis not present

## 2020-09-17 DIAGNOSIS — I480 Paroxysmal atrial fibrillation: Secondary | ICD-10-CM | POA: Diagnosis present

## 2020-09-17 DIAGNOSIS — Z659 Problem related to unspecified psychosocial circumstances: Secondary | ICD-10-CM | POA: Diagnosis not present

## 2020-09-17 DIAGNOSIS — I110 Hypertensive heart disease with heart failure: Secondary | ICD-10-CM | POA: Diagnosis present

## 2020-09-17 DIAGNOSIS — Z23 Encounter for immunization: Secondary | ICD-10-CM | POA: Diagnosis not present

## 2020-09-17 DIAGNOSIS — I5032 Chronic diastolic (congestive) heart failure: Secondary | ICD-10-CM | POA: Diagnosis present

## 2020-09-17 DIAGNOSIS — J84116 Cryptogenic organizing pneumonia: Secondary | ICD-10-CM | POA: Diagnosis not present

## 2020-09-17 NOTE — TOC Progression Note (Signed)
Transition of Care [facility]) - Progression Note    Patient Details  Name: Anthony [name] MRN: [id number] Date of Birth: Mar 15, 1956  Transition of Care [facility]) CM/SW [place], [place] Phone Number: 09/17/2020, 3:27 PM  Clinical Narrative:     There are no updates at this time. TOC will continue to follow.   Expected Discharge Plan: [place] Barriers to Discharge: Inadequate or no insurance,SNF Pending Medicaid,SNF Pending bed offer,SNF Pending payor source - LOG,Family Issues,Active Substance Use - Placement,Financial Resources,No SNF bed,Unsafe home situation  Expected Discharge Plan and Services Expected Discharge Plan: [place] arrangements for the past 2 months: Hotel/Motel                                       Social Determinants of Health (SDOH) Interventions    Readmission Risk Interventions No flowsheet data found.  Emeterio Reeve, Latanya Presser, [place] Social Worker [phone]

## 2020-09-17 NOTE — Progress Notes (Addendum)
Occupational Therapy Treatment/Discharge Patient Details Name: Anthony [name] MRN: [id number] DOB: 10/02/1955 Today's Date: 09/17/2020    History of present illness Pt is a 65 y.o. male admitted 08/13/20 after being found by social worker in his hotel room naked, incontinent of urine/feces with AMS; EMS noted tachycardia and hypotension. Workup for urosepsis, afib with RVR. CT/MRI negative for acute intracranial abnormality. PMH includes afib, ETOH use, HTN, HF, chronic back pain, anxiety, depression.   OT comments  Pt presents mostly with cognitive impairments and decreased safety awareness. No physical assist required for ADL tasks. Selfcare Mod I - Sup for safety for LB ADLs. Pt has met goals and reached maximum level of function and at this time. All education has been completed and OT will sign off  Follow Up Recommendations  SNF;Supervision/Assistance - 24 hour    Equipment Recommendations  None recommended by OT    Recommendations for Other Services      Precautions / Restrictions Precautions Precautions: Fall Precaution Comments: Incontinence Restrictions Weight Bearing Restrictions: No       Mobility Bed Mobility Overal bed mobility: Modified Independent Bed Mobility: Supine to Sit;Sit to Supine     Supine to sit: Modified independent (Device/Increase time) Sit to supine: Modified independent (Device/Increase time)   General bed mobility comments: no physical assist needed    Transfers Overall transfer level: Modified independent Equipment used: None Transfers: Sit to/from Stand Sit to Stand: Modified independent (Device/Increase time)              Balance Overall balance assessment: Needs assistance Sitting-balance support: Feet supported;No upper extremity supported Sitting balance-Leahy Scale: Good     Standing balance support: During functional activity;No upper extremity supported Standing balance-Leahy Scale: Fair                              ADL either performed or assessed with clinical judgement   ADL Overall ADL's : Needs assistance/impaired Eating/Feeding: Independent                                     General ADL Comments: pt presents mostly with cognitive impairments and decreased safety awareness. No physical assist required for ADL tasks. Selfcare Mod I - Sup for safety for LB ADLs     Vision Baseline Vision/History: Wears glasses Wears Glasses: Reading only Patient Visual Report: No change from baseline     Perception     Praxis      Cognition Arousal/Alertness: Awake/alert Behavior During Therapy: WFL for tasks assessed/performed Overall Cognitive Status: Within Functional Limits for tasks assessed Area of Impairment: Orientation;Attention;Memory;Following commands;Safety/judgement;Awareness;Problem solving                     Memory: Decreased short-term memory Following Commands: Follows one step commands consistently;Follows multi-step commands with increased time Safety/Judgement: Decreased awareness of safety;Decreased awareness of deficits   Problem Solving: Slow processing;Requires verbal cues General Comments: pt hyperverbose        Exercises     Shoulder Instructions       General Comments      Pertinent Vitals/ Pain       Pain Assessment: No/denies pain Faces Pain Scale: No hurt Pain Intervention(s): Monitored during session  Home Living  Prior Functioning/Environment              Frequency           Progress Toward Goals  OT Goals(current goals can now be found in the care plan section)  Progress towards OT goals: Progressing toward goals;Goals met/education completed, patient discharged from OT  Acute Rehab OT Goals Patient Stated Goal: to find a place to live  Plan All goals met and education completed, patient discharged from OT services    Co-evaluation                  AM-PAC OT "6 Clicks" Daily Activity     Outcome Measure   Help from another person eating meals?: None Help from another person taking care of personal grooming?: None Help from another person toileting, which includes using toliet, bedpan, or urinal?: A [place] Help from another person bathing (including washing, rinsing, drying)?: None Help from another person to put on and taking off regular upper body clothing?: A [place] Help from another person to put on and taking off regular lower body clothing?: None 6 Click Score: 22    End of Session    OT Visit Diagnosis: Unsteadiness on feet (R26.81);Other symptoms and signs involving cognitive function   Activity Tolerance Patient tolerated treatment well   Patient Left in bed;with call bell/phone within reach   Nurse Communication          Time: 1200-1219 OT Time Calculation (min): 19 min  Charges: OT General Charges $OT Visit: 1 Visit OT Treatments $Self Care/Home Management : 8-22 mins     Britt Bottom 09/17/2020, 2:25 PM

## 2020-09-17 NOTE — Progress Notes (Signed)
Family Medicine Teaching Service Daily Progress Note Intern Pager: [phone]  Patient name: Anthony [name] Medical record number: [id number] Date of birth: 21-Feb-1956 Age: 65 y.o. Gender: male  Primary Care Provider: Carollee Leitz, MD Consultants: Cards (s/o) Code Status: Full   Pt Overview and Major Events to Date:  1/25 admitted 1/29 medically stable for discharge to supervised residential setting  Assessment and Plan: Mr. Punch is a 65 year old male who presented to the hospital with urosepsis and A. fib with RVR due to medication non-adherence; these have been treated and resolved. PMH is significant forA Fib, alcohol use disorder, HFpEF, HTN, chronic back pain, anxiety, depression, right PE.  Patient is medically stable for discharge to supervised residential setting.  Difficult Placement Patient remains medically stable for discharge and has been since 1/29.  Given his cognitive deficits is in need of assisted living facility.  No bed offers as of yet. -Social work consulted, appreciate ongoing care  Iron deficiency anemia  Shortness of breath without hypoxia Continues to endorse shortness of breath with moderate exertion.  CBC this morning demonstrates hemoglobin 10.9, improved from 9.3 on 1/30.  Weekly BMP today unremarkable.  EKG this morning normal sinus rhythm at 74 bpm, normal QTC at 475, normal axis, no ST elevation.  Likely due to deconditioning versus chronic anemia. -Awaiting ambulatory pulse ox -Iron studies completed, indicate iron deficiency anemia. Will start daily PO supplementation  Chronic Back Pain Continues to complain of back pain unrelieved by tylenol. Reports using "all the creams every day".  -Tylenol 1000 mg scheduled 3 times daily -Lidocaine patch every 24 -Capsaicin cream twice daily as needed -Voltaren gel 4 times daily as needed -Muscle rub cream as needed -Heat and ice as necessary -baclofen 5 mgBID. If no improvement with baclofen, will  discontinue.   Xerosis: Stable -Daily moisturizing lotion -Lotion twice daily -Atarax 5 mg twice daily as needed  Recurrent Hypomagnesemia Magnesium 2/28 measures 1.7, stable from week prior. -Continue mag chloride 64 mg daily -Follow up labs everyMonday  Paroxysmal A-Fib: Chronic, stable Current heart rate 70s-80s. -Continue Metoprolol 25mg  daily, amiodarone 200mg  daily and Xarelto 20mg  daily  Alcohol Use Disorder:Chronic, stable -Daily thiamine, folic acid, and multivitamin  MDD  Anxiety:Chronic, stable -Continue Sertraline 200mg  daily  Malnutrition -Continue ensure/magic cup supplementation per nutrition  FEN/GI: Regular diet PPx: Xarelto 20 mg daily   Status is: Inpatient  Remains inpatient appropriate because:Awaiting placement   Dispo:             Patient From: Home             Planned Disposition: ALF             Medically stable for discharge: Yes             Difficult to place patient: Yes  Subjective:  No acute events overnight. Patient has questions with regards to his shortness of breath . Denies other complaints this morning  Objective: Temp:  [97.6 F (36.4 C)-97.9 F (36.6 C)] 97.6 F (36.4 C) (03/01 1445) Pulse Rate:  [78-88] 88 (03/01 1445) Resp:  [16-18] 16 (03/01 1445) BP: (93-121)/(56-82) 121/82 (03/01 1445) SpO2:  [96 %-100 %] 100 % (03/01 1445) Physical Exam: General: alert, NAD Cardiovascular: RRR no murmurs  Respiratory: CTAB. Normal WOB Abdomen: soft, non distended, non tender  Extremities: no LE edema   Laboratory: Recent Labs  Lab 09/16/20 0637  WBC 7.9  HGB 10.9*  HCT 34.8*  PLT 268   Recent Labs  Lab 09/16/20  7253  NA 138  K 3.7  CL 105  CO2 24  BUN 18  CREATININE 0.74  CALCIUM 9.4  GLUCOSE 94     Imaging/Diagnostic Tests: No results found.  Shary Key, DO 09/17/2020, 3:22 PM PGY-1, [place] Intern pager: [phone], text pages welcome

## 2020-09-17 NOTE — Care Management Important Message (Signed)
Important Message  Patient Details  Name: Anthony [name] MRN: [id number] Date of Birth: June 24, 1956   Medicare Important Message Given:  Yes     [place] Brigance Montine Circle 09/17/2020, 2:55 PM

## 2020-09-18 ENCOUNTER — Ambulatory Visit: Payer: Medicaid Other | Admitting: Physician Assistant

## 2020-09-18 DIAGNOSIS — I4891 Unspecified atrial fibrillation: Secondary | ICD-10-CM | POA: Diagnosis not present

## 2020-09-18 DIAGNOSIS — F1020 Alcohol dependence, uncomplicated: Secondary | ICD-10-CM | POA: Diagnosis not present

## 2020-09-18 DIAGNOSIS — Z659 Problem related to unspecified psychosocial circumstances: Secondary | ICD-10-CM | POA: Diagnosis not present

## 2020-09-18 MED ORDER — DM-GUAIFENESIN ER 30-600 MG PO TB12
1.0000 | ORAL_TABLET | Freq: Two times a day (BID) | ORAL | Status: DC
  Administered 2020-09-18 – 2020-10-06 (×37): 1 via ORAL
  Filled 2020-09-18 (×38): qty 1

## 2020-09-18 NOTE — Progress Notes (Signed)
Family Medicine Teaching Service Daily Progress Note Intern Pager: [phone]  Patient name: Anthony [name] Medical record number: [id number] Date of birth: [birth year] Age: 65 y.o. Gender: male  Primary Care Provider: Carollee Leitz, MD Consultants: Cards (s/o) Code Status: Full   Pt Overview and Major Events to Date:  1/25 admitted 1/29 medically stable for discharge to supervised residential setting  Assessment and Plan: Anthony [name] is a 65 year old male who presented to the hospital with urosepsis and A. fib with RVR due to medication non-adherence; these have been treated and resolved. PMH is significant forA Fib, alcohol use disorder, HFpEF, HTN, chronic back pain, anxiety, depression, right PE.  Patient is medically stable for discharge to supervised residential setting.  Difficult Placement Patient remains medically stable for discharge and has been since 1/29.  Given his cognitive deficits is in need of assisted living facility.  No bed offers as of yet. -Social work consulted, appreciate ongoing care  Iron deficiency anemia  Shortness of breath without hypoxia Continues to endorse shortness of breath with moderate exertion.  CBC on 2/28 with hemoglobin 10.9, improved from 9.3 on 1/30.  Weekly BMP unremarkable.  EKG this morning normal sinus rhythm at 74 bpm, normal QTC at 475, normal axis, no ST elevation.  Likely due to deconditioning versus chronic anemia. Iron studies completed, indicate iron deficiency anemia. - continue daily PO supplementation  Chronic Back Pain No complaints recently after adding Baclofen  -Tylenol 1000 mg scheduled 3 times daily -Lidocaine patch every 24 -Capsaicin cream twice daily as needed -Voltaren gel 4 times daily as needed -Muscle rub cream as needed -Heat and ice as necessary -baclofen 5 mgBID. If no improvement with baclofen, will discontinue.   Xerosis: Stable -Daily moisturizing lotion -Lotion twice daily -Atarax 5 mg twice daily as  needed  Recurrent Hypomagnesemia Magnesium 2/28 measures 1.7, stable from week prior. -Continue mag chloride 64 mg daily -Follow up labs everyMonday  Paroxysmal A-Fib: Chronic, stable Current heart rate 70s-80s. -Continue Metoprolol 25mg  daily, amiodarone 200mg  daily and Xarelto 20mg  daily  Alcohol Use Disorder:Chronic, stable -Daily thiamine, folic acid, and multivitamin  MDD  Anxiety:Chronic, stable -Continue Sertraline 200mg  daily  Malnutrition -Continue ensure/magic cup supplementation per nutrition  FEN/GI: Regular diet PPx: Xarelto 20 mg daily   Status is: Inpatient  Remains inpatient appropriate because:Awaiting placement   Dispo:             Patient From: Home             Planned Disposition: ALF             Medically stable for discharge: Yes             Difficult to place patient: Yes  Subjective:  No acute events overnight. Patient sleeping comfortably this morning.   Objective: Temp:  [97.6 F (36.4 C)-98.1 F (36.7 C)] 97.8 F (36.6 C) (03/02 0445) Pulse Rate:  [81-88] 81 (03/02 1019) Resp:  [16-18] 18 (03/02 0445) BP: (111-126)/(73-83) 115/82 (03/02 1019) SpO2:  [98 %-100 %] 98 % (03/02 1019) Physical Exam: General: alert, NAD  Respiratory:Normal WOB Abdomen: soft, non distended Extremities: no LE edema   Laboratory: Recent Labs  Lab 09/16/20 0637  WBC 7.9  HGB 10.9*  HCT 34.8*  PLT 268   Recent Labs  Lab 09/16/20 0637  NA 138  K 3.7  CL 105  CO2 24  BUN 18  CREATININE 0.74  CALCIUM 9.4  GLUCOSE 94     Imaging/Diagnostic Tests: No results found.  Shary Key, DO 09/18/2020, 1:15 PM PGY-1, [place] Intern pager: [phone], text pages welcome

## 2020-09-18 NOTE — Progress Notes (Deleted)
Cardiology Office Note:    Date:  09/18/2020   ID:  Anthony [name], DOB 05-10-1956, MRN [id number]  PCP:  Carollee Leitz, MD  [facility] Cardiologist:  Lauree Chandler, MD  [place] Electrophysiologist:  None   Chief Complaint:   History of Present Illness:    Anthony [name] is a 65 y.o. male with a hx of ***  Past Medical History:  Diagnosis Date  . Actinic keratosis 11/27/2016  . Alcohol abuse with alcohol-induced mood disorder ([place]) 07/18/2018  . Alcohol use disorder, severe, dependence ([place]) 09/16/2006   05/22/2015 Argumentative, belligerent and verbally abusive to staff per his note from [place]   . Alcoholism ([place])   . Anxiety state 04/25/2018  . Chronic low back pain 09/16/2006   Followed by NS.  Per his chart from [place], he fell off two storyhouse roof while cleaning his brother's gutters and sustained compression fracture of L3, L4 and L5 and fracture of right transverse process at L3 and nondisplaced fracture of left glenoid rim many years ago. He had multiple imaging in [place] including Lumbar MRI in 2016 which showed moderate to severe spinal canal stenos  . Delirium tremens ([place]) 09/01/2017  . Depression   . Dry eye 11/23/2016  . Hiatal hernia 09/14/2016   Status Collis gastroplasty and Nissen's fundoplication on 36/62/9476 in [place]  . History of delirium tremons 07/22/2020   DTs during 10/2016 08/2017. And 12/2017 admissions   . History of pulmonary embolus (PE) 11/02/2016   Unprovoked 11/01/2016. Xarelto from 11/01/16 to 09/08/2017.  Marland Kitchen Hydrocele    Surgically corrected  . Hypertension   . Hypoalbuminemia due to protein-calorie malnutrition ([place]) 07/22/2020  . Lumbar compression fracture ([place])   . Multiple rib fractures 07/22/2019   Left rib details XR: left ribs demonstrate multiple remote rib  . Pancytopenia ([place])   . Rhabdomyolysis 07/22/2020  . Skin cancer    exciced 2017  . Tinea versicolor 06/08/2013  . Tobacco use disorder 07/22/2020  .  Tooth infection 04/25/2018  . Trigger finger, acquired 11/18/2012  . Ulnar tunnel syndrome of right wrist 11/08/2012    Past Surgical History:  Procedure Laterality Date  . CATARACT EXTRACTION    . HIATAL HERNIA REPAIR  2016  . HYDROCELE EXCISION / REPAIR  2010  . SKIN CANCER EXCISION Left    Excised 2017    Current Medications: No outpatient medications have been marked as taking for the 09/18/20 encounter (Appointment) with Leanor Kail, [place].     Allergies:   Patient has no known allergies.   Social History   Socioeconomic History  . Marital status: Single    Spouse name: Not on file  . Number of children: Not on file  . Years of education: Not on file  . Highest education level: Not on file  Occupational History  . Occupation: unemployed  Tobacco Use  . Smoking status: Never Smoker  . Smokeless tobacco: Current User    Types: Snuff  Vaping Use  . Vaping Use: Never used  Substance and Sexual Activity  . Alcohol use: Yes    Alcohol/week: 43.0 standard drinks    Types: 42 Cans of beer, 1 Standard drinks or equivalent per week    Comment: daily use-6 beers daily and whiskey when he has it.  . Drug use: Yes    Types: Marijuana    Comment: occasional-none this year per pt  . Sexual activity: Yes    Birth control/protection: None  Other Topics Concern  . Not  on file  Social History Narrative   Lives with a friend. Has children but hasn't met them in a while. He doesn't know where they live.    Social Determinants of Health   Financial Resource Strain: Not on file  Food Insecurity: Not on file  Transportation Needs: Not on file  Physical Activity: Not on file  Stress: Not on file  Social Connections: Not on file     Family History: The patient's family history includes Cancer in his sister; Dementia in his father; Heart attack in his father; Stroke in his father. There is no history of Colon cancer, Colon polyps, Esophageal cancer, Stomach cancer, or Rectal  cancer.  ***  ROS:   Please see the history of present illness.    All other systems reviewed and are negative. ***  EKGs/Labs/Other Studies Reviewed:    The following studies were reviewed today: ***  EKG:  EKG is *** ordered today.  The ekg ordered today demonstrates ***  Recent Labs: 07/23/2020: TSH 2.849 08/18/2020: ALT 41 09/16/2020: BUN 18; Creatinine, Ser 0.74; Hemoglobin 10.9; Magnesium 1.7; Platelets 268; Potassium 3.7; Sodium 138  Recent Lipid Panel No results found for: CHOL, TRIG, HDL, CHOLHDL, VLDL, LDLCALC, LDLDIRECT   Risk Assessment/Calculations:   {Does this patient have ATRIAL FIBRILLATION?:[phone]}   Physical Exam:    VS:  There were no vitals taken for this visit.    Wt Readings from Last 3 Encounters:  09/16/20 149 lb 11.2 oz (67.9 kg)  08/09/20 138 lb 9.6 oz (62.9 kg)  05/17/19 168 lb (76.2 kg)     GEN: *** Well nourished, well developed in no acute distress HEENT: Normal NECK: No JVD; No carotid bruits LYMPHATICS: No lymphadenopathy CARDIAC: ***RRR, no murmurs, rubs, gallops RESPIRATORY:  Clear to auscultation without rales, wheezing or rhonchi  ABDOMEN: Soft, non-tender, non-distended MUSCULOSKELETAL:  No edema; No deformity  SKIN: Warm and dry NEUROLOGIC:  Alert and oriented x 3 PSYCHIATRIC:  Normal affect   ASSESSMENT AND PLAN:    1. ***  2. ***  Medication Adjustments/Labs and Tests Ordered: Current medicines are reviewed at length with the patient today.  Concerns regarding medicines are outlined above.  No orders of the defined types were placed in this encounter.  No orders of the defined types were placed in this encounter.   There are no Patient Instructions on file for this visit.   Jarrett Soho, [place]  09/18/2020 8:03 AM    [facility]

## 2020-09-19 DIAGNOSIS — Z659 Problem related to unspecified psychosocial circumstances: Secondary | ICD-10-CM | POA: Diagnosis not present

## 2020-09-19 DIAGNOSIS — F1020 Alcohol dependence, uncomplicated: Secondary | ICD-10-CM | POA: Diagnosis not present

## 2020-09-19 NOTE — Progress Notes (Signed)
Family Medicine Teaching Service Daily Progress Note Intern Pager: [phone]  Patient name: Anthony [name] Medical record number: [id number] Date of birth: 1956/07/03 Age: 65 y.o. Gender: male  Primary Care Provider: Carollee Leitz, MD Consultants: Cards (s/o) Code Status: Full   Pt Overview and Major Events to Date:  1/25 admitted 1/29 medically stable for discharge to supervised residential setting  Assessment and Plan: Anthony [name] is a 65 year old male who presented to the hospital with urosepsis and A. fib with RVR due to medication non-adherence; these have been treated and resolved. PMH is significant forA Fib, alcohol use disorder, HFpEF, HTN, chronic back pain, anxiety, depression, right PE.  Patient is medically stable for discharge to supervised residential setting.  Difficult Placement:  Medically stable since end of 07/2020.  Unfortunately difficult placement due to behavioral/cognitive deficits.  No willing family outpatient and not able to care for himself. He does not have capacity to make medical decisions.  -Appreciate social work's continued efforts -Will consider formal psychiatry evaluation for second opinion on capacity given likely proceeding with court legal guardianship  Iron deficiency anemia  Dyspnea on exertion: Stable.  Suspect as possible etiology for DOE, in addition to deconditioning.  Hgb 10.9 with panel consistent with iron deficiency anemia on 2/28.  Started iron supplementation on 3/1.  NSR EKG recently with preserved EF on echo in 07/2020. -Continue to monitor symptoms -Continue ferrous sulfate daily -Recheck CBC in approximately 4 weeks or sooner if needed -Should proceed with outpatient colonoscopy for screening, no current s/sx of bleeding   Chronic Back Pain: Stable.  Improved with baclofen addition, will monitor and provide patient list of as needed medications that he can request. -Tylenol 1000 mg scheduled 3 times daily -Capsaicin cream twice  daily as needed -Voltaren gel 4 times daily as needed -Muscle rub cream as needed -Heat and ice as necessary -baclofen 5 mgTID  Xerosis: Stable -Daily moisturizing lotion -Lotion twice daily -Atarax 5 mg twice daily as needed  Recurrent Hypomagnesemia: Stable. Mag consistently 1.7. -Continue mag chloride 64 mg daily -Follow up labs everyMonday  Paroxysmal A-Fib, RVR resolved: Chronic, stable Rate controlled. -Continue Metoprolol 25mg  daily, amiodarone 200mg  daily and Xarelto 20mg  daily  Alcohol Use Disorder:Chronic, stable -Daily thiamine, folic acid, and multivitamin  MDD  Anxiety:Chronic, stable -Continue Sertraline 200mg  daily  Malnutrition -Continue ensure/magic cup supplementation, patient intermittently taking  FEN/GI: Regular diet PPx: Xarelto 20 mg daily   Status is: Inpatient  Remains inpatient appropriate because:Awaiting placement   Dispo:             Patient From: Home             Planned Disposition: SNF             Medically stable for discharge: Yes             Difficult to place patient: Yes  Subjective:  No acute events overnight.  Had a long discussion with the patient that he is frustrated with his pain regimen.  States he just started the baclofen today and wished that it was started earlier.  He also is annoyed that we have several as needed pain/itch creams that he should not be responsible for asking for them and knowing what they are.  Objective: Temp:  [97.5 F (36.4 C)-97.9 F (36.6 C)] 97.5 F (36.4 C) (03/03 0526) Pulse Rate:  [71-81] 77 (03/03 0526) Resp:  [17-18] 18 (03/03 0526) BP: (101-115)/(67-82) 108/71 (03/03 0526) SpO2:  [97 %-100 %] 100 % (  03/03 0526) Weight:  [68.7 kg] 68.7 kg (03/03 0526) Physical Exam: General: Alert, NAD HEENT: NCAT, MMM Lungs: No increased WOB  Msk: Moves all extremities spontaneously  Psych: Alert and oriented.  Poor insight, frequently accusing against staff about not getting  medications despite receiving them every day.  Can follow normal conversation.  Laboratory: Recent Labs  Lab 09/16/20 0637  WBC 7.9  HGB 10.9*  HCT 34.8*  PLT 268   Recent Labs  Lab 09/16/20 0637  NA 138  K 3.7  CL 105  CO2 24  BUN 18  CREATININE 0.74  CALCIUM 9.4  GLUCOSE 94     Imaging/Diagnostic Tests: No results found.  [name], DO 09/19/2020, 8:05 AM PGY-3, [place] Intern pager: [phone], text pages welcome
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
## 2020-09-20 DIAGNOSIS — Z659 Problem related to unspecified psychosocial circumstances: Secondary | ICD-10-CM | POA: Diagnosis not present

## 2020-09-20 DIAGNOSIS — I4891 Unspecified atrial fibrillation: Secondary | ICD-10-CM | POA: Diagnosis not present

## 2020-09-20 DIAGNOSIS — F1020 Alcohol dependence, uncomplicated: Secondary | ICD-10-CM | POA: Diagnosis not present

## 2020-09-20 NOTE — Progress Notes (Signed)
Family Medicine Teaching Service Daily Progress Note Intern Pager: [phone]  Patient name: Anthony [name] Medical record number: [id number] Date of birth: [birth year] Age: 65 y.o. Gender: male  Primary Care Provider: Carollee Leitz, MD Consultants: Cards (s/o) Code Status: Full   Pt Overview and Major Events to Date:  1/25 admitted 1/29 medically stable for discharge to supervised residential setting  Assessment and Plan: Mr. Anthony [name] a 65 year old male who presented to the hospital with urosepsis and A. fib with RVR due to medication non-adherence;these have been treated and resolved.PMH is significant forA Fib, alcohol use disorder, HFpEF, HTN, chronic back pain, anxiety, depression, right PE.Patient is medically stable for discharge to supervised residential setting.  Difficult Placement  Capacity Medically stable since end of 07/2020.  Unfortunately difficult placement due to behavioral/cognitive deficits.  No willing family outpatient and not able to care for himself. He does not have capacity to make medical decisions.  -Appreciate social work's continued efforts -Will consider formal psychiatry evaluation for second opinion on capacity given likely proceeding with court legal guardianship  Iron deficiency anemia Dyspnea on exertion: Stable.  Suspect as possible etiology for DOE, in addition to deconditioning.  Hgb 10.9 with panel consistent with iron deficiency anemia on 2/28.  Started iron supplementation on 3/1.  NSR EKG recently with preserved EF on echo in 07/2020. -Continue to monitor symptoms -Continue ferrous sulfate daily -Recheck CBC in approximately 4 weeks or sooner if needed -Should proceed with outpatient colonoscopy for screening, no current s/sx of bleeding   Chronic Back Pain: Stable.  Improved with baclofen addition, will monitor and provide patient list of as needed medications that he can request. -Tylenol 1000 mg scheduled 3 times daily -Capsaicin cream  twice daily as needed -Voltaren gel 4 times daily as needed -Muscle rub cream as needed -Heat and ice as necessary -baclofen 5 mgTID  Xerosis: Stable -Daily moisturizing lotion -Lotion twice daily -Atarax 5 mg twice daily as needed  Recurrent Hypomagnesemia: Stable. Mag consistently 1.7. -Continue mag chloride 64 mg daily -Follow up labs everyMonday  Paroxysmal A-Fib, RVR resolved: Chronic, stable Rate controlled. -Continue Metoprolol 25mg  daily, amiodarone 200mg  daily and Xarelto 20mg  daily  Alcohol Use Disorder:Chronic, stable -Daily thiamine, folic acid, and multivitamin  MDD  Anxiety:Chronic, stable -Continue Sertraline 200mg  daily  Malnutrition -Continue ensure/magic cup supplementation, patient intermittently taking  FEN/GI:Regular diet [id number]:[id number] 20 mg daily   Status is: Inpatient  Remains inpatient appropriate because:Awaiting placement   Dispo: Patient From: Home Planned Disposition: SNF Medically stable for discharge: Yes Difficult to place patient: Yes  Subjective:  No acute events overnight. Patient expressed that he felt that his shortness of breath was never addressed. He is also upset that his back has been hurting. When I asked about Baclofen and that if it isnt helping we can have him stop taking it, he stated he only took it once yesterday and wants to keep taking it.   Objective: Temp:  [97.6 F (36.4 C)-98.1 F (36.7 C)] 97.6 F (36.4 C) (03/04 0702) Pulse Rate:  [76-90] 76 (03/04 0702) Resp:  [17-18] 17 (03/04 0702) BP: (98-111)/(65-78) 111/78 (03/04 0702) SpO2:  [96 %-100 %] 100 % (03/04 0702) Weight:  [68.5 kg] 68.5 kg (03/04 0700) Physical Exam: General: alert, NAD Cardiovascular: RRR no murmurs   Respiratory: CTAB. Normal WOB Abdomen: soft, non distended, non tender  Extremities: moving spontaneously, distal pulses 2+  Laboratory: Recent Labs  Lab  09/16/20 0637  WBC 7.9  HGB 10.9*  HCT 34.8*  PLT  268   Recent Labs  Lab 09/16/20 0637  NA 138  K 3.7  CL 105  CO2 24  BUN 18  CREATININE 0.74  CALCIUM 9.4  GLUCOSE 94     Imaging/Diagnostic Tests: No results found.  Shary Key, DO 09/20/2020, 8:03 AM PGY-1, [place] Intern pager: [phone], text pages welcome

## 2020-09-20 NOTE — Progress Notes (Signed)
Family Medicine Teaching Service Daily Progress Note Intern Pager: [phone]  Patient name: Anthony [name] Medical record number: [id number] Date of birth: 08-21-1955 Age: 65 y.o. Gender: male  Primary Care Provider: Dana Allan, MD Consultants: No current consultants Code Status: FULL  Pt Overview and Major Events to Date:  1/25: Admitted 1/29: Medically stable for discharge to supervised residential setting  Assessment and Plan: Anthony [name] a 65 year old male who presented to the hospital with urosepsis and A. fib with RVR due to medication non-adherence;these have been treated and resolved. He continues to require inpatient hospitalization due to unsafe discharge plan and is pending acceptance to a supervised residential setting.PMH is significant forA Fib, alcohol use disorder, HFpEF, HTN, chronic back pain, anxiety, depression, right PE.Patient is medically stable for discharge to supervised residential setting.  Unsafe Disposition  Lacks Capacity Medically stable since end of 08/17/2020. Appreciate CSW help in finding safe disposition for this patient. This has been on-going and barriers to placement include his behavioral/cognitive deficits. He lacks capacity to make medical decisions and family members that have been contacted do not want to obtain guardianship of him. CSW awaiting to hear back from patients daughter before reaching out to DSS to file guardianship. -Continue to follow up with CSW daily regarding updates  Iron deficiency anemia Dyspnea on exertion: Stable.  Started iron supplementation on 3/1.  -Continue ferrous sulfate 325 mg daily -Recheck CBC in approximately 4 weeks (3/28) or sooner if needed -Recommend outpatient colonoscopy for colon cancer screening  Chronic Back Pain: Stable.  Continue to monitor and provide supportive measures below. -Tylenol 1000 mg TID -Capsaicin cream twice daily  -Voltaren gel 4 times daily as needed -Muscle rub cream as  needed -Baclofen 5 mgTID  Xerosis: Stable -Camphor-Menthol lotion PRN   Recurrent Hypomagnesemia: Stable. Mg 1.7 on 2/28. Weekly labs.  -Continue mag chloride 64 mg daily -F/u Mg level (next lab check 3/7)  Paroxysmal A-Fib, RVR: resolved  Rate controlled. -Continue Metoprolol 25mg  daily, amiodarone 200mg  daily and Xarelto 20mg  daily  Alcohol Use Disorder:Chronic, stable -Daily thiamine, folic acid, and multivitamin  MDD  Anxiety:Chronic, stable -Continue Sertraline 200mg  daily  Malnutrition -Continue ensure BID between meals  FEN/GI: Regular  PPx: Xarelto 20 mg daily    Status is: Inpatient  Remains inpatient appropriate because:Unsafe d/c plan   Dispo:  Patient From: Home  Planned Disposition: ALF  Medically stable for discharge: Yes  Difficult to place patient: Yes  Subjective:  NAEO. Anthony [name] is concerned about his breathing. He is fine at rest but states that he sometimes has difficulty with ambulating, however, he has been able to ambulate through the halls throughout the night. He has not fallen and does not feel dizzy.   Objective: Temp:  [97.6 F (36.4 C)-98.2 F (36.8 C)] 97.6 F (36.4 C) (03/04 1952) Pulse Rate:  [75-90] 78 (03/04 1952) Resp:  [16-18] 18 (03/04 1952) BP: (99-111)/(65-78) 101/71 (03/04 1952) SpO2:  [96 %-100 %] 96 % (03/04 1952) Weight:  [68.5 kg] 68.5 kg (03/04 0700) Physical Exam: General: Awake, alert, pleasant, conversant, in no distress Cardiovascular: RRR without murmur Respiratory: CTAB without wheezing, rhonchi or rales. Good air movement.  Abdomen: Normoactive bowel sounds, non-tender  Extremities: No edema, 2+ radial and DP pulses   Laboratory: Recent Labs  Lab 09/16/20 0637  WBC 7.9  HGB 10.9*  HCT 34.8*  PLT 268   Recent Labs  Lab 09/16/20 0637  NA 138  K 3.7  CL 105  CO2 24  BUN 18  CREATININE 0.74  CALCIUM 9.4  GLUCOSE 94    Imaging/Diagnostic Tests: None new.   Sabino Dick, DO 09/20/2020, 8:48 PM PGY-1, [facility] Intern pager: [phone], text pages welcome

## 2020-09-21 DIAGNOSIS — E43 Unspecified severe protein-calorie malnutrition: Secondary | ICD-10-CM | POA: Diagnosis not present

## 2020-09-21 DIAGNOSIS — I4891 Unspecified atrial fibrillation: Secondary | ICD-10-CM | POA: Diagnosis not present

## 2020-09-21 DIAGNOSIS — Z659 Problem related to unspecified psychosocial circumstances: Secondary | ICD-10-CM | POA: Diagnosis not present

## 2020-09-21 DIAGNOSIS — F1020 Alcohol dependence, uncomplicated: Secondary | ICD-10-CM | POA: Diagnosis not present

## 2020-09-22 ENCOUNTER — Inpatient Hospital Stay (HOSPITAL_COMMUNITY): Payer: Medicare Other

## 2020-09-22 DIAGNOSIS — R059 Cough, unspecified: Secondary | ICD-10-CM | POA: Diagnosis not present

## 2020-09-22 DIAGNOSIS — Z659 Problem related to unspecified psychosocial circumstances: Secondary | ICD-10-CM | POA: Diagnosis not present

## 2020-09-22 DIAGNOSIS — I4891 Unspecified atrial fibrillation: Secondary | ICD-10-CM | POA: Diagnosis not present

## 2020-09-22 DIAGNOSIS — F1020 Alcohol dependence, uncomplicated: Secondary | ICD-10-CM | POA: Diagnosis not present

## 2020-09-22 NOTE — Progress Notes (Signed)
Family Medicine Teaching Service Daily Progress Note Intern Pager: [phone]  Patient name: Anthony [name] Medical record number: [id number] Date of birth: 17-Oct-1955 Age: 65 y.o. Gender: male  Primary Care Provider: Carollee Leitz, MD Consultants: No current consultants Code Status: FULL  Pt Overview and Major Events to Date:  1/25: Admitted 1/29: Medically stable for discharge to supervised residential setting  Assessment and Plan: Mr. Angelos a 65 year old male who presented to the hospital with urosepsis and A. fib with RVR due to medication non-adherence;these have been treated and resolved. He continues to require inpatient hospitalization due to unsafe discharge plan and is pending acceptance to a supervised residential setting.PMH is significant forA Fib, alcohol use disorder, HFpEF, HTN, chronic back pain, anxiety, depression, right PE.Patient is medically stable for discharge to supervised residential setting.  Unsafe Disposition  Lacks Capacity Medically stable since end of 08/17/2020. Appreciate CSW help in finding safe disposition for this patient. This has been on-going and barriers to placement include his behavioral/cognitive deficits. He lacks capacity to make medical decisions and family members that have been contacted do not want to obtain guardianship of him. -Continue to follow up with CSW daily regarding updates  Iron deficiency anemia Dyspnea on exertion: Stable.  Started iron supplementation on 3/1.  -Continue ferrous sulfate 325 mg daily -Recheck CBC in approximately 4 weeks (3/28) or sooner if needed -Recommend outpatient colonoscopy for colon cancer screening  Chronic Back Pain: Stable.  Continue to monitor and provide supportive measures below. -Tylenol 1000 mg TID -Capsaicin cream twice daily  -Voltaren gel 4 times daily as needed -Muscle rub cream as needed -Baclofen 5 mgTID  Xerosis: Stable -Camphor-Menthol lotion PRN   Recurrent  Hypomagnesemia: Stable. Mg 1.7 on 2/28. Weekly labs.  -Continue mag chloride 64 mg daily -F/u Mg level (next lab check 3/7)  Paroxysmal A-Fib, RVR: resolved  Rate controlled. -Continue Metoprolol 25mg  daily, amiodarone 200mg  daily and Xarelto 20mg  daily - pulse ox with ambulation, per patient request  Alcohol Use Disorder:Chronic, stable -Daily thiamine, folic acid, and multivitamin  MDD  Anxiety:Chronic, stable -Continue Sertraline 200mg  daily  Malnutrition -Continue ensure BID between meals  FEN/GI: Regular  PPx: Xarelto 20 mg daily   Status is: Inpatient  Remains inpatient appropriate because:Unsafe d/c plan  Dispo:  Patient From:    Planned Disposition:    Medically stable for discharge:       Subjective:  Anthony [name] is doing well today. His biggest concern today is that we have not been checking his heart lungs while he is exerting himself and only at rest.  Objective: Temp:  [97.7 F (36.5 C)-98 F (36.7 C)] 98 F (36.7 C) (03/06 0508) Pulse Rate:  [68-80] 68 (03/06 0508) Resp:  [16-18] 16 (03/06 0508) BP: (103-115)/(70-77) 115/77 (03/06 0508) SpO2:  [99 %] 99 % (03/06 0508) Physical Exam: General: Awake, alert, pleasant, conversant, in no distress Cardiovascular: RRR without murmur Respiratory: CTAB without wheezing, rhonchi or rales. Good air movement.   Extremities: No edema, 2+ radial and DP pulses   Laboratory: Recent Labs  Lab 09/16/20 0637  WBC 7.9  HGB 10.9*  HCT 34.8*  PLT 268   Recent Labs  Lab 09/16/20 0637  NA 138  K 3.7  CL 105  CO2 24  BUN 18  CREATININE 0.74  CALCIUM 9.4  GLUCOSE 94    Imaging/Diagnostic Tests: None new.   Richarda Osmond, DO 09/22/2020, 7:23 AM PGY-3, [place] Intern pager: [phone], text pages welcome

## 2020-09-22 NOTE — Plan of Care (Signed)
  Problem: Education: Goal: Knowledge of General Education information will improve Description: Including pain rating scale, medication(s)/side effects and non-pharmacologic comfort measures Outcome: Progressing   Problem: Education: Goal: Knowledge of General Education information will improve Description: Including pain rating scale, medication(s)/side effects and non-pharmacologic comfort measures 09/22/2020 2302 by Keturah Shavers, RN Outcome: Progressing 09/22/2020 2301 by Keturah Shavers, RN Outcome: Progressing

## 2020-09-22 NOTE — Progress Notes (Signed)
SATURATION QUALIFICATIONS: (This note is used to comply with regulatory documentation for home oxygen)  Patient Saturations on Room Air at Rest = 100%  Patient Saturations on Room Air while Ambulating = 100%  Patient Saturations on 0 Liters of oxygen while Ambulating = 100%  Please briefly explain why patient needs home oxygen: 

## 2020-09-23 ENCOUNTER — Inpatient Hospital Stay (HOSPITAL_COMMUNITY): Payer: Medicare Other

## 2020-09-23 DIAGNOSIS — F1020 Alcohol dependence, uncomplicated: Secondary | ICD-10-CM | POA: Diagnosis not present

## 2020-09-23 DIAGNOSIS — I4891 Unspecified atrial fibrillation: Secondary | ICD-10-CM | POA: Diagnosis not present

## 2020-09-23 DIAGNOSIS — R059 Cough, unspecified: Secondary | ICD-10-CM

## 2020-09-23 LAB — MAGNESIUM: Magnesium: 1.7 mg/dL (ref 1.7–2.4)

## 2020-09-23 LAB — BASIC METABOLIC PANEL
Anion gap: 8 (ref 5–15)
BUN: 18 mg/dL (ref 8–23)
CO2: 22 mmol/L (ref 22–32)
Calcium: 9.4 mg/dL (ref 8.9–10.3)
Chloride: 105 mmol/L (ref 98–111)
Creatinine, Ser: 1.02 mg/dL (ref 0.61–1.24)
GFR, Estimated: >60 mL/min (ref >60)
Glucose, Bld: 106 mg/dL — ABNORMAL HIGH (ref 70–99)
Potassium: 4.3 mmol/L (ref 3.5–5.1)
Sodium: 135 mmol/L (ref 135–145)

## 2020-09-23 MED ORDER — IOHEXOL 300 MG/ML  SOLN
75.0000 mL | Freq: Once | INTRAMUSCULAR | Status: AC | PRN
  Administered 2020-09-23: 75 mL via INTRAVENOUS

## 2020-09-23 NOTE — Social Work (Signed)
CSW called [place] to began the process for pt to have a legal guardian. CSW left message for Adria Devon at [phone]. [facility] voicemail stated allow 48 hours for a return call.   Emeterio Reeve, Latanya Presser, [place] Social Worker [phone]

## 2020-09-23 NOTE — Progress Notes (Signed)
Family Medicine Teaching Service Daily Progress Note Intern Pager: [phone]  Patient name: Anthony [name] Medical record number: [id number] Date of birth: 19-Sep-1955 Age: 65 y.o. Gender: male  Primary Care Provider: Carollee Leitz, MD Consultants: Cards Code Status: Full  Pt Overview and Major Events to Date:  1/25 admitted 1/29 medically stable for discharge to supervised residential setting  Assessment and Plan: Anthony [name] a 65 year old male who presented to the hospital with urosepsis and A. fib with RVR due to medication non-adherence;these have been treated and resolved.PMH is significant forA Fib, alcohol use disorder, HFpEF, HTN, chronic back pain, anxiety, depression, right PE.Patient is medically stable for discharge to supervised residential setting.   Difficult Placement  Lacks Capacity Medically stable since end of 07/2020.  Unfortunately difficult placement due to behavioral/cognitive deficits.  No willing family outpatient and not able to care for himself. He does not have capacity to make medical decisions.  -Appreciate social work's continued efforts -Continue to follow-up with CSW updates  Cough Patient endorses increase in cough and SOB. CXR shows new hazy opacity in L apex possibly infection vs malignancy. CT chest ordered  -f/u CT   Iron deficiency anemia Dyspnea on exertion: Stable.  Suspect as possible etiology for DOE, in addition to deconditioning.  Hgb 10.9 with panel consistent with iron deficiency anemia on 2/28.  Started iron supplementation on 3/1.  NSR EKG recently with preserved EF on echo in 07/2020.  -Continue to monitor symptoms -Continue ferrous sulfate daily -Recheck CBC in approximately 4 weeks or sooner if needed -Should proceed with outpatient colonoscopy for screening, no current s/sx of bleeding   Chronic Back Pain: Stable.  Improved with baclofen addition, will monitor and provide patient list of as needed medications that he can  request. -Tylenol 1000 mg scheduled 3 times daily -Capsaicin cream twice daily as needed -Voltaren gel 4 times daily as needed -Muscle rub cream as needed -Heat and ice as necessary -baclofen 5 mgTID  Xerosis: Stable -Daily moisturizing lotion -Lotion twice daily -Atarax 5 mg twice daily as needed  Recurrent Hypomagnesemia: Stable. Mag consistently 1.7. -Continue mag chloride 64 mg daily -Follow up labs everyMonday  Paroxysmal A-Fib, RVR resolved: Chronic, stable Rate controlled. -Continue Metoprolol 25mg  daily, amiodarone 200mg  daily and Xarelto 20mg  daily  Alcohol Use Disorder:Chronic, stable -Daily thiamine, folic acid, and multivitamin  MDD  Anxiety:Chronic, stable -Continue Sertraline 200mg  daily  Malnutrition -Continue ensure/magic cup supplementation, patient intermittently taking  FEN/GI:Regular diet [id number] 20 mg daily   Status is: Inpatient  Remains inpatient appropriate because:Awaiting placement   Dispo: Patient From: Home Planned Disposition:SNF Medically stable for discharge: Yes Difficult to place patient: Yes   Subjective:  No acute events overnight. Patient sitting on the side of the bed this morning, just finishing being seen by the nurse. Denies any complaints this morning and I told him we would follow up with him about his CT.   Objective: Temp:  [97.4 F (36.3 C)-98.1 F (36.7 C)] 97.6 F (36.4 C) (03/07 1326) Pulse Rate:  [78-89] 78 (03/07 1326) Resp:  [16-18] 16 (03/07 1326) BP: (105-113)/(72-78) 107/72 (03/07 1326) SpO2:  [97 %-100 %] 97 % (03/07 1326) Physical Exam: General: alert, NAD Cardiovascular: RRR no murmurs  Respiratory: CTAB normal WOB Abdomen: soft, non distended Extremities: warm, dry. No edema   Laboratory: No results for input(s): WBC, HGB, HCT, PLT in the last 168 hours. Recent Labs  Lab 09/23/20 0435  NA 135  K 4.3  CL 105  CO2  22  BUN 18  CREATININE 1.02  CALCIUM 9.4  GLUCOSE 106*    Imaging/Diagnostic Tests: None new- awaiting CT results   Shary Key, DO 09/23/2020, 2:06 PM PGY-1, [place] Intern pager: [phone], text pages welcome

## 2020-09-24 DIAGNOSIS — I4891 Unspecified atrial fibrillation: Secondary | ICD-10-CM | POA: Diagnosis not present

## 2020-09-24 DIAGNOSIS — R059 Cough, unspecified: Secondary | ICD-10-CM | POA: Diagnosis not present

## 2020-09-24 DIAGNOSIS — F1020 Alcohol dependence, uncomplicated: Secondary | ICD-10-CM | POA: Diagnosis not present

## 2020-09-24 LAB — CBC WITH DIFFERENTIAL/PLATELET
Abs Immature Granulocytes: 0.03 10*3/uL (ref 0.00–0.07)
Basophils Absolute: 0.1 10*3/uL (ref 0.0–0.1)
Basophils Relative: 1 %
Eosinophils Absolute: 0.5 10*3/uL (ref 0.0–0.5)
Eosinophils Relative: 7 %
HCT: 29.6 % — ABNORMAL LOW (ref 39.0–52.0)
Hemoglobin: 9.5 g/dL — ABNORMAL LOW (ref 13.0–17.0)
Immature Granulocytes: 0 %
Lymphocytes Relative: 33 %
Lymphs Abs: 2.5 10*3/uL (ref 0.7–4.0)
MCH: 32.4 pg (ref 26.0–34.0)
MCHC: 32.1 g/dL (ref 30.0–36.0)
MCV: 101 fL — ABNORMAL HIGH (ref 80.0–100.0)
Monocytes Absolute: 0.7 10*3/uL (ref 0.1–1.0)
Monocytes Relative: 9 %
Neutro Abs: 3.8 10*3/uL (ref 1.7–7.7)
Neutrophils Relative %: 50 %
Platelets: 289 10*3/uL (ref 150–400)
RBC: 2.93 MIL/uL — ABNORMAL LOW (ref 4.22–5.81)
RDW: 13.2 % (ref 11.5–15.5)
WBC: 7.5 10*3/uL (ref 4.0–10.5)
nRBC: 0 % (ref 0.0–0.2)

## 2020-09-24 LAB — MRSA PCR SCREENING: MRSA by PCR: NEGATIVE

## 2020-09-24 MED ORDER — CEFTRIAXONE SODIUM 1 G IJ SOLR
1.0000 g | INTRAMUSCULAR | Status: AC
  Administered 2020-09-24 – 2020-09-28 (×5): 1 g via INTRAVENOUS
  Filled 2020-09-24 (×2): qty 1
  Filled 2020-09-24 (×2): qty 10
  Filled 2020-09-24: qty 1

## 2020-09-24 MED ORDER — BENZOCAINE 10 % MT GEL
Freq: Three times a day (TID) | OROMUCOSAL | Status: DC | PRN
  Filled 2020-09-24: qty 9

## 2020-09-24 NOTE — Progress Notes (Addendum)
Family Medicine Teaching Service Daily Progress Note Intern Pager: [phone]  Patient name: Anthony [name] Medical record number: [id number] Date of birth: 09/02/1955 Age: 65 y.o. Gender: male  Primary Care Provider: Carollee Leitz, MD Consultants: Cardiology (s/o) Code Status: Full   Pt Overview and Major Events to Date:  1/25 admitted 1/29 medically stable for discharge to supervised residential setting  Assessment and Plan: Anthony [name] a 65 year old male who presented to the hospital with urosepsis and A. fib with RVR due to medication non-adherence;these have been treated and resolved.PMH is significant forA Fib, alcohol use disorder, HFpEF, HTN, chronic back pain, anxiety, depression, right PE.Patient is medically stable for discharge to supervised residential setting.   Difficult Placement  Lacks Capacity Medically stable since end of 07/2020.  Unfortunately difficult placement due to behavioral/cognitive deficits.  No willing family outpatient and not able to care for himself. He does not have capacity to make medical decisions.  -Appreciate social work's continued efforts -Continue to follow-up with CSW updates  Hospital acquired pneumonia  CXR shows new hazy opacity in L apex possibly infection vs malignancy. CT normal. - Con't ceftriaxone    Iron deficiency anemia Dyspnea on exertion: Stable.  Suspect as possible etiology for DOE, in addition to deconditioning.  Hgb 10.9 with panel consistent with iron deficiency anemia on 2/28.  Started iron supplementation on 3/1.  -Continue to monitor symptoms -Continue ferrous sulfate daily -Recheck CBC in approximately 4 weeks or sooner if needed -Should proceed with outpatient colonoscopy for screening, no current s/sx of bleeding   Chronic Back Pain: Stable.  Improved with baclofen addition, will monitor and provide patient list of as needed medications that he can request. -Tylenol 1000 mg scheduled 3 times  daily -Capsaicin cream twice daily as needed -Voltaren gel 4 times daily as needed -Muscle rub cream as needed -Heat and ice as necessary -baclofen 5 mgTID  Xerosis: Stable -Daily moisturizing lotion -Lotion twice daily -Atarax 5 mg twice daily as needed  Recurrent Hypomagnesemia: Stable. Mag consistently 1.7. -Continue mag chloride 64 mg daily -Follow up labs everyMonday  Paroxysmal A-Fib, RVR resolved: Chronic, stable Rate controlled. -Continue Metoprolol 25mg  daily, amiodarone 200mg  daily and Xarelto 20mg  daily  Alcohol Use Disorder:Chronic, stable -Daily thiamine, folic acid, and multivitamin  MDD  Anxiety:Chronic, stable -Continue Sertraline 200mg  daily  Malnutrition -Continue ensure/magic cup supplementation, patient intermittently taking  FEN/GI:Regular diet [id number] 20 mg daily  Status is: Inpatient  Remains inpatient appropriate because:Awaiting placement  Dispo: Patient From: Home Planned Disposition:SNF Medically stable for discharge: Yes Difficult to place patient: Yes  Subjective:  No acute events overnight. Patient endorses skin itching and feeling like bugs are crawling on him, pointing to the veins on his right lower leg. Also complains that he thought he was not treated for his lung infection, but the nurse had just told him that it wasn't yet time for his antibiotics today but he is actually receiving his medicine.    Objective: Temp:  [97.4 F (36.3 C)-97.8 F (36.6 C)] 97.6 F (36.4 C) (03/08 1411) Pulse Rate:  [71-79] 74 (03/08 1411) Resp:  [17-18] 17 (03/08 1411) BP: (111-122)/(72-84) 122/84 (03/08 1411) SpO2:  [98 %-99 %] 98 % (03/08 1411) Weight:  [69 kg] 69 kg (03/08 0526) Physical Exam: General: alert, NAD Cardiovascular: RRR no murmurs Respiratory: CTAB normal WOB Abdomen: soft, non distended, non tender  Extremities: warm, dry. No edema   Laboratory: Recent  Labs  Lab 09/24/20 0010  WBC 7.5  HGB 9.5*  HCT  29.6*  PLT 289   Recent Labs  Lab 09/23/20 0435  NA 135  K 4.3  CL 105  CO2 22  BUN 18  CREATININE 1.02  CALCIUM 9.4  GLUCOSE 106*    Imaging/Diagnostic Tests: None new  Shary Key, DO 09/24/2020, 5:43 PM PGY-1, [place] Intern pager: [phone], text pages welcome

## 2020-09-24 NOTE — Progress Notes (Signed)
Family Medicine Teaching Service Daily Progress Note Intern Pager: [phone]  Patient name: Anthony [name] Medical record number: [id number] Date of birth: [birth year] Age: 65 y.o. Gender: male  Primary Care Provider: Carollee Leitz, MD Consultants: Cardiology Code Status: Full   Pt Overview and Major Events to Date:  1/25 admitted 1/29 medically stable for discharge to supervised residential setting  Assessment and Plan: Mr. Hankerson a 65 year old male who presented to the hospital with urosepsis and A. fib with RVR due to medication non-adherence;these have been treated and resolved.PMH is significant forA Fib, alcohol use disorder, HFpEF, HTN, chronic back pain, anxiety, depression, right PE.Patient is medically stable for discharge to supervised residential setting.   Difficult Placement  Lacks Capacity Medically stable since end of 07/2020.  Unfortunately difficult placement due to behavioral/cognitive deficits.  No willing family outpatient and not able to care for himself. He does not have capacity to make medical decisions.  -Appreciate social work's continued efforts -Continue to follow-up with CSW updates  Cough Patient endorses cough with occasional sputum production and SOB . CXR shows new hazy opacity in L apex possibly infection vs malignancy. CT normal - Start cefipime    Iron deficiency anemia Dyspnea on exertion: Stable.  Suspect as possible etiology for DOE, in addition to deconditioning.  Hgb 10.9 with panel consistent with iron deficiency anemia on 2/28.  Started iron supplementation on 3/1.  -Continue to monitor symptoms -Continue ferrous sulfate daily -Recheck CBC in approximately 4 weeks or sooner if needed -Should proceed with outpatient colonoscopy for screening, no current s/sx of bleeding   Chronic Back Pain: Stable.  Improved with baclofen addition, will monitor and provide patient list of as needed medications that he can request. -Tylenol 1000  mg scheduled 3 times daily -Capsaicin cream twice daily as needed -Voltaren gel 4 times daily as needed -Muscle rub cream as needed -Heat and ice as necessary -baclofen 5 mgTID  Xerosis: Stable -Daily moisturizing lotion -Lotion twice daily -Atarax 5 mg twice daily as needed  Recurrent Hypomagnesemia: Stable. Mag consistently 1.7. -Continue mag chloride 64 mg daily -Follow up labs everyMonday  Paroxysmal A-Fib, RVR resolved: Chronic, stable Rate controlled. -Continue Metoprolol 25mg  daily, amiodarone 200mg  daily and Xarelto 20mg  daily  Alcohol Use Disorder:Chronic, stable -Daily thiamine, folic acid, and multivitamin  MDD  Anxiety:Chronic, stable -Continue Sertraline 200mg  daily  Malnutrition -Continue ensure/magic cup supplementation, patient intermittently taking  FEN/GI:Regular diet NOI:BBCWUGQ 20 mg daily   Status is: Inpatient  Remains inpatient appropriate because:Awaiting placement   Dispo: Patient From: Home Planned Disposition:SNF Medically stable for discharge: Yes Difficult to place patient: Yes  Subjective:  No acute events overnight. Patient complains of his shortness of breath, dental problems, his cataract surgery, and other issues that he feels has not been addressed. He feels that providers and staff walk out on him while he talks.    Objective: Temp:  [97.4 F (36.3 C)-97.8 F (36.6 C)] 97.4 F (36.3 C) (03/08 0526) Pulse Rate:  [71-79] 79 (03/08 0526) Resp:  [16-18] 18 (03/08 0526) BP: (107-117)/(72-82) 111/72 (03/08 0526) SpO2:  [97 %-99 %] 98 % (03/08 0526) Weight:  [69 kg] 69 kg (03/08 0526) Physical Exam: General: alert, NAD Cardiovascular: RRR no murmurs  Respiratory: CTAB normal WOB Abdomen: soft, non distended Extremities: warm, dry. No edema   Laboratory: Recent Labs  Lab 09/24/20 0010  WBC 7.5  HGB 9.5*  HCT 29.6*  PLT 289   Recent Labs  Lab  09/23/20 0435  NA 135  K 4.3  CL 105  CO2 22  BUN 18  CREATININE 1.02  CALCIUM 9.4  GLUCOSE 106*     Imaging/Diagnostic Tests:  CT CHEST W CONTRAST  Result Date: 09/23/2020 CLINICAL DATA:  Abnormal chest x-ray EXAM: CT CHEST WITH CONTRAST TECHNIQUE: Multidetector CT imaging of the chest was performed during intravenous contrast administration. CONTRAST:  2mL OMNIPAQUE IOHEXOL 300 MG/ML  SOLN COMPARISON:  None. FINDINGS: Cardiovascular: Normal heart size. Coronary artery calcification. No pericardial effusion. Main pulmonary artery is normal in caliber. No evidence of central pulmonary embolism. Thoracic aorta is normal in caliber. Mediastinum/Nodes: Thyroid is unremarkable. No enlarged lymph nodes. Evidence of prior surgery at the gastroesophageal junction. Lungs/Pleura: There is patchy ground-glass density in the left upper lobe accounting for abnormality on radiograph. Mild bibasilar atelectasis and scarring. Small calcified granuloma of the right upper lobe. No pleural effusion. No pneumothorax. Upper Abdomen: Surgical clips adjacent to the spleen. No acute abnormality. Musculoskeletal: Degenerative changes of the included spine. Small central calcified disc protrusion at C6-C7. Chronic L2 and L3 compression fractures. No acute osseous abnormality IMPRESSION: Patchy ground-glass density within the left upper lobe accounting for chest radiograph abnormality. Likely infectious/inflammatory in etiology. Electronically Signed   By: Macy Mis M.D.   On: 09/23/2020 16:56    Shary Key, DO 09/24/2020, 9:03 AM PGY-1, [place] Intern pager: [phone], text pages welcome

## 2020-09-25 DIAGNOSIS — I4891 Unspecified atrial fibrillation: Secondary | ICD-10-CM | POA: Diagnosis not present

## 2020-09-25 DIAGNOSIS — F1020 Alcohol dependence, uncomplicated: Secondary | ICD-10-CM | POA: Diagnosis not present

## 2020-09-25 DIAGNOSIS — R059 Cough, unspecified: Secondary | ICD-10-CM | POA: Diagnosis not present

## 2020-09-25 NOTE — TOC Progression Note (Signed)
Transition of Care [facility]) - Progression Note    Patient Details  Name: Anthony [name] MRN: [id number] Date of Birth: 1955-09-14  Transition of Care [facility]) CM/SW [place], [place] Phone Number: 09/25/2020, 5:11 PM  Clinical Narrative:     There are no placement updates at this time.  Expected Discharge Plan: [place] Barriers to Discharge: Inadequate or no insurance,SNF Pending Medicaid,SNF Pending bed offer,SNF Pending payor source - LOG,Family Issues,Active Substance Use - Placement,Financial Resources,No SNF bed,Unsafe home situation  Expected Discharge Plan and Services Expected Discharge Plan: [place] arrangements for the past 2 months: Hotel/Motel                                       Social Determinants of Health (SDOH) Interventions    Readmission Risk Interventions No flowsheet data found.  Emeterio Reeve, Latanya Presser, [place] Social Worker [phone]

## 2020-09-26 DIAGNOSIS — R059 Cough, unspecified: Secondary | ICD-10-CM | POA: Diagnosis not present

## 2020-09-26 DIAGNOSIS — F1020 Alcohol dependence, uncomplicated: Secondary | ICD-10-CM | POA: Diagnosis not present

## 2020-09-26 DIAGNOSIS — I4891 Unspecified atrial fibrillation: Secondary | ICD-10-CM | POA: Diagnosis not present

## 2020-09-26 MED ORDER — ACETAMINOPHEN 325 MG PO TABS
650.0000 mg | ORAL_TABLET | Freq: Four times a day (QID) | ORAL | Status: DC
  Administered 2020-09-26 – 2020-10-09 (×45): 650 mg via ORAL
  Filled 2020-09-26 (×46): qty 2

## 2020-09-26 NOTE — Progress Notes (Signed)
Family Medicine Teaching Service Daily Progress Note Intern Pager: [phone]  Patient name: Anthony [name] Medical record number: [id number] Date of birth: [birth year] Age: 65 y.o. Gender: male  Primary Care Provider: Carollee Leitz, MD Consultants: Cardiology Code Status: Full   Pt Overview and Major Events to Date:  1/25 admitted 1/29 medically stable for discharge to supervised residential setting  Assessment and Plan: Mr. Anthony [name] a 65 year old male who presented to the hospital with urosepsis and A. fib with RVR due to medication non-adherence;these have been treated and resolved.PMH is significant forA Fib, alcohol use disorder, HFpEF, HTN, chronic back pain, anxiety, depression, right PE.Patient is medically stable for discharge to supervised residential setting.   Difficult Placement  Lacks Capacity Medically stable since end of 07/2020.  Unfortunately difficult placement due to behavioral/cognitive deficits.  No willing family outpatient and not able to care for himself. He does not have capacity to make medical decisions.  -Appreciate social work's continued efforts -Continue to follow-up with CSW updates  Hospital acquired pneumonia  CXR shows new hazy opacity in L apex possibly infection vs malignancy. CT normal. - Continue Cefriaxone (3/8-)   Iron deficiency anemia Dyspnea on exertion: Stable.  Suspect as possible etiology for DOE, in addition to deconditioning.  Hgb 10.9 with panel consistent with iron deficiency anemia on 2/28.  Started iron supplementation on 3/1.  -Continue to monitor symptoms -Continue ferrous sulfate daily -Recheck CBC in approximately 4 weeks or sooner if needed -Should proceed with outpatient colonoscopy for screening, no current s/sx of bleeding   Chronic Back Pain: Stable.  Improved with baclofen addition, will monitor and provide patient list of as needed medications that he can request. -Tylenol 1000 mg scheduled 3 times daily. Can  consider reducing dose and giving more frequently  -Capsaicin cream twice daily as needed -Voltaren gel 4 times daily as needed -Muscle rub cream as needed -Heat and ice as necessary -baclofen 5 mgTID  Xerosis: Stable -Daily moisturizing lotion -Lotion twice daily -Atarax 5 mg twice daily as needed  Recurrent Hypomagnesemia: Stable. Mag consistently 1.7. -Continue mag chloride 64 mg daily -Follow up labs everyMonday  Paroxysmal A-Fib, RVR resolved: Chronic, stable Rate controlled. -Continue Metoprolol 25mg  daily, amiodarone 200mg  daily and Xarelto 20mg  daily  Alcohol Use Disorder:Chronic, stable -Daily thiamine, folic acid, and multivitamin  MDD  Anxiety:Chronic, stable -Continue Sertraline 200mg  daily  Malnutrition -Continue ensure/magic cup supplementation, patient intermittently taking  FEN/GI:Regular diet QIW:[id number] 20 mg daily   Status is: Inpatient  Remains inpatient appropriate because:Awaiting placement   Dispo: Patient From: Home Planned Disposition:SNF Medically stable for discharge: Yes Difficult to place patient: Yes  Subjective:  Patient reports issues with the shower water not being hot, and that he gets short of breath when showoering. He is concerned that we are not checking his breathing when he does rigorous activities. Reports 7/10 back pain.   Objective: Temp:  [97.4 F (36.3 C)-98.3 F (36.8 C)] 97.8 F (36.6 C) (03/10 0354) Pulse Rate:  [74-88] 74 (03/10 0354) Resp:  [17-18] 18 (03/10 0354) BP: (98-129)/(65-95) 102/68 (03/10 0354) SpO2:  [97 %-99 %] 97 % (03/10 0354) Weight:  [71.1 kg] 71.1 kg (03/10 0357) Physical Exam: General: alert, NAD Cardiovascular: RRR no murmurs Respiratory: CTAB normal WOB Abdomen: soft, non distended, non tender Extremities: warm, dry, no edema   Laboratory: Recent Labs  Lab 09/24/20 0010  WBC 7.5  HGB 9.5*  HCT 29.6*  PLT 289    Recent Labs  Lab 09/23/20 0435  NA 135  K 4.3  CL 105  CO2 22  BUN 18  CREATININE 1.02  CALCIUM 9.4  GLUCOSE 106*    Imaging/Diagnostic Tests: None new  Shary Key, DO 09/26/2020, 9:02 AM PGY-1, [place] Intern pager: [phone], text pages welcome

## 2020-09-26 NOTE — TOC Progression Note (Addendum)
Transition of Care [facility]) - Progression Note    Patient Details  Name: Anthony [name] MRN: [id number] Date of Birth: 11-06-1955  Transition of Care [facility]) CM/SW [place], [place] Phone Number: 09/26/2020, 11:57 AM  Clinical Narrative:     CSW spoke to Adria Devon about guardianship. Olivia Mackie stated that pt will need to have a petition filled through the clerk of court ot an APS report will need to be made first.   CSW called APS line and left message with details to call back.  3:10pm CSW completed APS report with Ebony. Charlena Cross stated the information will be reviewed and CSW will be informed if/when the case has been accepted.   Expected Discharge Plan: [place] Barriers to Discharge: Inadequate or no insurance,SNF Pending Medicaid,SNF Pending bed offer,SNF Pending payor source - LOG,Family Issues,Active Substance Use - Placement,Financial Resources,No SNF bed,Unsafe home situation  Expected Discharge Plan and Services Expected Discharge Plan: [place] arrangements for the past 2 months: Hotel/Motel                                       Social Determinants of Health (SDOH) Interventions    Readmission Risk Interventions No flowsheet data found.  Emeterio Reeve, Latanya Presser, [place] Social Worker [phone]

## 2020-09-27 DIAGNOSIS — F1020 Alcohol dependence, uncomplicated: Secondary | ICD-10-CM | POA: Diagnosis not present

## 2020-09-27 DIAGNOSIS — I4891 Unspecified atrial fibrillation: Secondary | ICD-10-CM | POA: Diagnosis not present

## 2020-09-27 DIAGNOSIS — R059 Cough, unspecified: Secondary | ICD-10-CM | POA: Diagnosis not present

## 2020-09-27 NOTE — Progress Notes (Signed)
Family Medicine Teaching Service Daily Progress Note Intern Pager: [phone]  Patient name: Anthony [name] Medical record number: [id number] Date of birth: Jan 14, 1956 Age: 65 y.o. Gender: male  Primary Care Provider: Carollee Leitz, MD Consultants: Cardiology (s/o) Code Status: Full  Pt Overview and Major Events to Date:  1/25 admitted 1/29 medically stable for discharge to supervised residential setting  Assessment and Plan: Anthony [name] a 65 year old male who presented to the hospital with urosepsis and A. fib with RVR due to medication non-adherence;these have been treated and resolved.PMH is significant forA Fib, alcohol use disorder, HFpEF, HTN, chronic back pain, anxiety, depression, right PE.Patient is medically stable for discharge to supervised residential setting.   Difficult Placement  Lacks Capacity Medically stable since end of 07/2020.  Unfortunately difficult placement due to behavioral/cognitive deficits.  No willing family outpatient and not able to care for himself. He does not have capacity to make medical decisions.  -Appreciate social work's continued efforts -Continue to follow-up with CSW updates  Hospital acquired pneumonia - Cefriaxone (3/8-3/12)   Iron deficiency anemia Dyspnea on exertion: Stable.  - Ferrous sulfate daily - Recheck CBC around 4/28  Chronic Back Pain: Stable.  -Tylenol 1000 mg scheduled 3 times daily.  -Capsaicin cream twice daily as needed -Voltaren gel 4 times daily as needed -Muscle rub cream as needed -Heat and ice as necessary -baclofen 5 mgTID  Xerosis: Stable -Daily moisturizing lotion -Lotion twice daily -Atarax 5 mg twice daily as needed  Recurrent Hypomagnesemia: Stable. - Continue mag chloride 64 mg daily - labs everyMonday  Paroxysmal A-Fib, RVR resolved: Chronic, stable Rate controlled. - Metoprolol 25mg  daily,  - amiodarone 200mg  daily  - Xarelto 20mg  daily  Alcohol Use Disorder:Chronic,  stable -Daily thiamine, folic acid, and multivitamin  MDD  Anxiety:Chronic, stable -Continue Sertraline 200mg  daily  Malnutrition -Continue ensure/magic cup supplementation, patient intermittently taking  FEN/GI:Regular diet HGD:[id number] 20 mg daily   Status is: Inpatient  Remains inpatient appropriate because:Awaiting placement   Dispo: Patient From: Home Planned Disposition:SNF Medically stable for discharge: Yes Difficult to place patient: Yes  Subjective:  No acute events overnight.  No new complaints this morning.  He continues to note some shortness of breath with exertion.  He was wished a happy birthday.  Objective: Temp:  [97.6 F (36.4 C)-98.2 F (36.8 C)] 98.2 F (36.8 C) (03/11 2059) Pulse Rate:  [74-78] 77 (03/11 2059) Resp:  [17-18] 18 (03/11 2059) BP: (114-115)/(74-84) 115/74 (03/11 2059) SpO2:  [99 %-100 %] 100 % (03/11 2059) Weight:  [71.1 kg] 71.1 kg (03/11 0355) Physical Exam: General: Alert and cooperative and appears to be in no acute distress.  Lying in bed comfortably. Cardio: Normal S1 and S2, no S3 or S4. Rhythm is regular. No murmurs or rubs.   Pulm: Clear to auscultation bilaterally, no crackles, wheezing, or diminished breath sounds. Normal respiratory effort Abdomen: Bowel sounds normal. Abdomen soft and non-tender.  Extremities: No peripheral edema. Warm/ well perfused.  Strong radial pulses. Neuro: Cranial nerves grossly intact   Laboratory: Recent Labs  Lab 09/24/20 0010  WBC 7.5  HGB 9.5*  HCT 29.6*  PLT 289   Recent Labs  Lab 09/23/20 0435  NA 135  K 4.3  CL 105  CO2 22  BUN 18  CREATININE 1.02  CALCIUM 9.4  GLUCOSE 106*    Imaging/Diagnostic Tests: No results found.   Matilde Haymaker, MD 09/27/2020, 9:10 PM PGY-3, [place] Intern pager: [phone], text pages welcome

## 2020-09-27 NOTE — TOC Progression Note (Signed)
Transition of Care [facility]) - Progression Note    Patient Details  Name: Anthony [name] MRN: [id number] Date of Birth: 1956/05/01  Transition of Care [facility]) CM/SW [place], [place] Phone Number: 09/27/2020, 3:48 PM  Clinical Narrative:     CSW spoke to [place] social worker Suan Halter. Vincente Liberty stated she spent 30 minutes talking to pt at bedside and states she believes that pt does have capacity to make decisions.Vincente Liberty  has been following pt for the past couple months and states he has greatly improved over the last couple months. CSW Vincente Liberty and MD Ardelia Mems  discussed that at this point pt needs a pysch eval since there are differing opinions about capacity. MD states she will order psych consult.    TOC will follow.   Expected Discharge Plan: [place] Barriers to Discharge: Inadequate or no insurance,SNF Pending Medicaid,SNF Pending bed offer,SNF Pending payor source - LOG,Family Issues,Active Substance Use - Placement,Financial Resources,No SNF bed,Unsafe home situation  Expected Discharge Plan and Services Expected Discharge Plan: [place] arrangements for the past 2 months: Hotel/Motel                                       Social Determinants of Health (SDOH) Interventions    Readmission Risk Interventions No flowsheet data found.  Emeterio Reeve, Latanya Presser, [place] Social Worker [phone]

## 2020-09-27 NOTE — Progress Notes (Signed)
Family Medicine Teaching Service Daily Progress Note Intern Pager: [phone]  Patient name: Anthony [name] Medical record number: [id number] Date of birth: [birth year] Age: 65 y.o. Gender: male  Primary Care Provider: Carollee Leitz, MD Consultants: Cardiology (s/o) Code Status: Full  Pt Overview and Major Events to Date:  1/25 admitted 1/29 medically stable for discharge to supervised residential setting  Assessment and Plan: Anthony [name] a 65 year old male who presented to the hospital with urosepsis and A. fib with RVR due to medication non-adherence;these have been treated and resolved.PMH is significant forA Fib, alcohol use disorder, HFpEF, HTN, chronic back pain, anxiety, depression, right PE.Patient is medically stable for discharge to supervised residential setting.   Difficult Placement  Lacks Capacity Medically stable since end of 07/2020.  Unfortunately difficult placement due to behavioral/cognitive deficits.  No willing family outpatient and not able to care for himself. He does not have capacity to make medical decisions.  -Appreciate social work's continued efforts -Continue to follow-up with CSW updates  Hospital acquired pneumonia CXR shows new hazy opacity in L apex possibly infection vs malignancy. CT normal. - Continue Cefriaxone (3/8-)   Iron deficiency anemia Dyspnea on exertion: Stable.  Suspect as possible etiology for DOE, in addition to deconditioning.  Hgb 10.9 with panel consistent with iron deficiency anemia on 2/28.  Started iron supplementation on 3/1.  -Continue to monitor symptoms -Continue ferrous sulfate daily -Recheck CBC in approximately 4 weeks or sooner if needed -Should proceed with outpatient colonoscopy for screening, no current s/sx of bleeding   Chronic Back Pain: Stable.  Improved with baclofen addition, will monitor and provide patient list of as needed medications that he can request. -Tylenol 1000 mg scheduled 3 times daily.  Can consider reducing dose and giving more frequently  -Capsaicin cream twice daily as needed -Voltaren gel 4 times daily as needed -Muscle rub cream as needed -Heat and ice as necessary -baclofen 5 mgTID  Xerosis: Stable -Daily moisturizing lotion -Lotion twice daily -Atarax 5 mg twice daily as needed  Recurrent Hypomagnesemia: Stable. Mag consistently 1.7. -Continue mag chloride 64 mg daily -Follow up labs everyMonday  Paroxysmal A-Fib, RVR resolved: Chronic, stable Rate controlled. -Continue Metoprolol 25mg  daily, amiodarone 200mg  daily and Xarelto 20mg  daily  Alcohol Use Disorder:Chronic, stable -Daily thiamine, folic acid, and multivitamin  MDD  Anxiety:Chronic, stable -Continue Sertraline 200mg  daily  Malnutrition -Continue ensure/magic cup supplementation, patient intermittently taking  FEN/GI:Regular diet [id number]:[id number] 20 mg daily   Status is: Inpatient  Remains inpatient appropriate because:Awaiting placement   Dispo: Patient From: Home Planned Disposition:SNF Medically stable for discharge: Yes Difficult to place patient: Yes  Subjective:  No acute events overnight. Patient sleeping comfortably when I examined him.   Objective: Temp:  [97.8 F (36.6 C)-98 F (36.7 C)] 98 F (36.7 C) (03/11 0357) Pulse Rate:  [74-81] 74 (03/11 0357) Resp:  [16-18] 18 (03/10 2037) BP: (99-115)/(65-84) 115/84 (03/11 0357) SpO2:  [90 %-99 %] 99 % (03/11 0357) Weight:  [71.1 kg] 71.1 kg (03/11 0355) Physical Exam: General: well appearing, sleeping, NAD Respiratory: breathing comfortably on RA Abdomen: soft, non distended  Extremities: warm, dry. No edema   Laboratory: Recent Labs  Lab 09/24/20 0010  WBC 7.5  HGB 9.5*  HCT 29.6*  PLT 289   Recent Labs  Lab 09/23/20 0435  NA 135  K 4.3  CL 105  CO2 22  BUN 18  CREATININE 1.02  CALCIUM 9.4  GLUCOSE 106*    Imaging/Diagnostic  Tests: None new  Gerrit Rafalski,  Weldon Picking, DO 09/27/2020, 12:06 PM PGY-1, [place] Intern pager: [phone], text pages welcome

## 2020-09-28 DIAGNOSIS — I4891 Unspecified atrial fibrillation: Secondary | ICD-10-CM | POA: Diagnosis not present

## 2020-09-28 NOTE — Consult Note (Signed)
[facility] Face-to-Face Psychiatry Consult   Reason for Consult:'' To determine capacity for DSS.''  Referring Physician:  McDiarmid Sherren Mocha, MD Patient Identification: Anthony [name] MRN:  [id number] Principal Diagnosis: Atrial fibrillation with RVR ([place]) Diagnosis:  Principal Problem:   Atrial fibrillation with RVR ([place]) Active Problems:   Poor social situation   Severe sepsis ([place])   Urinary tract infection without hematuria   Pressure injury of skin   Alcohol use disorder, severe, in controlled environment ([place])   Protein-calorie malnutrition, severe   Hypomagnesemia   Cough   Total Time spent with patient: 45 minutes  Subjective:   Anthony [name] is a 65 y.o. male patient admitted with urosepsis and A.fib  HPI:  65 year old male with history ofA Fib, HFpEF, HTN, chronic back pain, right PE, Alcohol use disorder, Anxiety and depression. Patient is alert, cooperative, oriented to time, place, person and situation. He reports long history of alcohol use dating back to about 50 years ago for which he has had alcohol rehabilitation due to multiple DWI. He also reports taking medication for depression and anxiety. He states that his major stressor is being homeless and a [place] frustrated but hopeful that he will get places in a facility. He denies anxiety, craving for alcohol, psychosis, delusions, mood swings and self harming thoughts. Patient scored 28/30 on mini-mental status examination.  Past Psychiatric History: as above  Risk to Self:  denies Risk to Others:  denies Prior Inpatient Therapy:  alcohol rehab Prior Outpatient Therapy:    Past Medical History:  Past Medical History:  Diagnosis Date  . Actinic keratosis 11/27/2016  . Alcohol abuse with alcohol-induced mood disorder ([place]) 07/18/2018  . Alcohol use disorder, severe, dependence ([place]) 09/16/2006   05/22/2015 Argumentative, belligerent and verbally abusive to staff per his note from [place]   . Alcoholism ([place])   .  Anxiety state 04/25/2018  . Chronic low back pain 09/16/2006   Followed by NS.  Per his chart from [place], he fell off two storyhouse roof while cleaning his brother's gutters and sustained compression fracture of L3, L4 and L5 and fracture of right transverse process at L3 and nondisplaced fracture of left glenoid rim many years ago. He had multiple imaging in [place] including Lumbar MRI in 2016 which showed moderate to severe spinal canal stenos  . Delirium tremens ([place]) 09/01/2017  . Depression   . Dry eye 11/23/2016  . Hiatal hernia 09/14/2016   Status Collis gastroplasty and Nissen's fundoplication on 34/19/3790 in [place]  . History of delirium tremons 07/22/2020   DTs during 10/2016 08/2017. And 12/2017 admissions   . History of pulmonary embolus (PE) 11/02/2016   Unprovoked 11/01/2016. Xarelto from 11/01/16 to 09/08/2017.  Marland Kitchen Hydrocele    Surgically corrected  . Hypertension   . Hypoalbuminemia due to protein-calorie malnutrition ([place]) 07/22/2020  . Lumbar compression fracture ([place])   . Multiple rib fractures 07/22/2019   Left rib details XR: left ribs demonstrate multiple remote rib  . Pancytopenia ([place])   . Rhabdomyolysis 07/22/2020  . Skin cancer    exciced 2017  . Tinea versicolor 06/08/2013  . Tobacco use disorder 07/22/2020  . Tooth infection 04/25/2018  . Trigger finger, acquired 11/18/2012  . Ulnar tunnel syndrome of right wrist 11/08/2012    Past Surgical History:  Procedure Laterality Date  . CATARACT EXTRACTION    . HIATAL HERNIA REPAIR  2016  . HYDROCELE EXCISION / REPAIR  2010  . SKIN CANCER EXCISION Left    Excised 2017  Family History:  Family History  Problem Relation Age of Onset  . Heart attack Father        died at 15 years  . Dementia Father   . Stroke Father   . Cancer Sister   . Colon cancer Neg Hx   . Colon polyps Neg Hx   . Esophageal cancer Neg Hx   . Stomach cancer Neg Hx   . Rectal cancer Neg Hx    Family Psychiatric  History:  Social  History:  Social History   Substance and Sexual Activity  Alcohol Use Yes  . Alcohol/week: 43.0 standard drinks  . Types: 42 Cans of beer, 1 Standard drinks or equivalent per week   Comment: daily use-6 beers daily and whiskey when he has it.     Social History   Substance and Sexual Activity  Drug Use Yes  . Types: Marijuana   Comment: occasional-none this year per pt    Social History   Socioeconomic History  . Marital status: Single    Spouse name: Not on file  . Number of children: Not on file  . Years of education: Not on file  . Highest education level: Not on file  Occupational History  . Occupation: unemployed  Tobacco Use  . Smoking status: Never Smoker  . Smokeless tobacco: Current User    Types: Snuff  Vaping Use  . Vaping Use: Never used  Substance and Sexual Activity  . Alcohol use: Yes    Alcohol/week: 43.0 standard drinks    Types: 42 Cans of beer, 1 Standard drinks or equivalent per week    Comment: daily use-6 beers daily and whiskey when he has it.  . Drug use: Yes    Types: Marijuana    Comment: occasional-none this year per pt  . Sexual activity: Yes    Birth control/protection: None  Other Topics Concern  . Not on file  Social History Narrative   Lives with a friend. Has children but hasn't met them in a while. He doesn't know where they live.    Social Determinants of Health   Financial Resource Strain: Not on file  Food Insecurity: Not on file  Transportation Needs: Not on file  Physical Activity: Not on file  Stress: Not on file  Social Connections: Not on file   Additional Social History:    Allergies:  No Known Allergies  Labs: No results found for this or any previous visit (from the past 48 hour(s)).  Current Facility-Administered Medications  Medication Dose Route Frequency Provider Last Rate Last Admin  . 0.9 %  sodium chloride infusion   Intravenous PRN Zenia Resides, MD   Stopped at 08/16/20 0118  . acetaminophen  (TYLENOL) tablet 650 mg  650 mg Oral Q6H Lattie Haw, MD   650 mg at 09/28/20 0912  . amiodarone (PACERONE) tablet 200 mg  200 mg Oral Daily Reino Bellis B, NP   200 mg at 09/28/20 0912  . baclofen (LIORESAL) tablet 5 mg  5 mg Oral TID [name], MD   5 mg at 09/28/20 0912  . benzocaine (ORAJEL) 10 % mucosal gel   Mouth/Throat TID PRN Gladys Damme, MD      . calcium carbonate (TUMS - dosed in mg elemental calcium) chewable tablet 200 mg of elemental calcium  1 tablet Oral TID PRN [name], MD   200 mg of elemental calcium at 09/15/20 1934  . camphor-menthol (SARNA) lotion   Topical PRN [name], MD  Given at 09/18/20 0756  . capsaicin (ZOSTRIX) 0.025 % cream   Topical BID [name], MD   Given at 09/27/20 1026  . chlorhexidine (PERIDEX) 0.12 % solution 15 mL  15 mL Mouth/Throat BID Matilde Haymaker, MD   15 mL at 09/27/20 2237  . dextromethorphan-guaiFENesin (MUCINEX DM) 30-600 MG per 12 hr tablet 1 tablet  1 tablet Oral BID Shary Key, DO   1 tablet at 09/28/20 0912  . diclofenac Sodium (VOLTAREN) 1 % topical gel 2 g  2 g Topical QID PRN [name], MD   2 g at 09/24/20 0944  . feeding supplement (ENSURE ENLIVE / ENSURE PLUS) liquid 237 mL  237 mL Oral BID BM Zenia Resides, MD   237 mL at 09/25/20 0851  . ferrous sulfate tablet 325 mg  325 mg Oral Q breakfast Matilde Haymaker, MD   325 mg at 09/28/20 0912  . folic acid (FOLVITE) tablet 1 mg  1 mg Oral Daily [name], MD   1 mg at 09/28/20 0911  . hydroxypropyl methylcellulose / hypromellose (ISOPTO TEARS / GONIOVISC) 2.5 % ophthalmic solution 1 drop  1 drop Both Eyes PRN Milus Banister C, DO      . loratadine (CLARITIN) tablet 10 mg  10 mg Oral Daily McDiarmid, Blane Ohara, MD   10 mg at 09/28/20 0912  . magnesium chloride (SLOW-MAG) 64 MG SR tablet 64 mg  1 tablet Oral Daily Dickie La, MD   64 mg at 09/28/20 0917  . metoprolol succinate (TOPROL-XL) 24 hr tablet 25 mg  25 mg Oral Daily Matilde Haymaker, MD   25 mg at 09/28/20 0915  . multivitamin with minerals tablet 1 tablet  1 tablet Oral Daily Zenia Resides, MD   1 tablet at 09/28/20 0912  . Muscle Rub CREA   Topical PRN [name], MD   Given at 09/27/20 2237  . pneumococcal 23 valent vaccine (PNEUMOVAX-23) injection 0.5 mL  0.5 mL Intramuscular Tomorrow-1000 [name], MD      . rivaroxaban Alveda Reasons) tablet 20 mg  20 mg Oral Q supper Zenia Resides, MD   20 mg at 09/27/20 1657  . sertraline (ZOLOFT) tablet 100 mg  100 mg Oral Daily Zenia Resides, MD   100 mg at 09/28/20 0912  . thiamine tablet 100 mg  100 mg Oral Daily [name], MD   100 mg at 09/28/20 1572    Musculoskeletal: Strength & Muscle Tone: not tested Gait & Station: not tested Patient leans: N/A   Psychiatric Specialty Exam:  Presentation  General Appearance: fairly groomed Eye Contact:good Speech:clear and coherent Speech Volume:normal  Mood and Affect  Mood:euthymic Affect:congruent  Thought Process  Thought Processes:logical Descriptions of Associations:intact Orientation:to time, place, person and situation Thought Content:No data recorded Hallucinations:none Ideas of Reference:none Suicidal Thoughts:denies Homicidal Thoughts:denies Sensorium  Memory: intact Judgment:intact Insight:fair Executive Functions  Concentration:good Attention Span: good [place] Psychomotor Activity  Psychomotor Activity:normal Assets  Assets: desire for improvement  Sleep  Sleep:fair  Physical Exam: Physical Exam Psychiatric:        Attention and Perception: Attention and perception normal.        Mood and Affect: Mood and affect normal.        Speech: Speech normal.        Behavior: Behavior normal. Behavior is cooperative.        Thought Content: Thought content normal.        Cognition and Memory:  Cognition and memory normal.        Judgment: Judgment normal.    Review of  Systems  Constitutional: Negative.   Psychiatric/Behavioral: Negative.    Blood pressure 118/79, pulse 75, temperature 98.1 F (36.7 C), resp. rate 16, height 5' 8"  (1.727 m), weight 71.1 kg, SpO2 97 %. Body mass index is 23.83 kg/m.  Treatment Plan Summary: 65 year old male with history of multiple medical issues, depression, anxiety, alcohol use disorder who was admitted to the hospital for Urosepsis, Afib which has been treated. Currently, patient denies any psychiatric issues and verbalized that his main stressor is being homeless and difficulty finding a facility for him.He denies anxiety, craving for alcohol, psychosis, delusions, mood swings and self harming thoughts. Patient scored 28/30 on mini-mental status examination. Based on my evaluation today, patient has a mental capacity to make informed decision. However, I don't know if he possess physical ability to live independently. Please, be informed that Capacity changes from time to time and in view of that, patient may benefit from legal Competency evaluation.    Disposition: No evidence of imminent risk to self or others at present.   Supportive therapy provided about ongoing stressors. Psychiatric service signing out. Re-consult as needed  Corena Pilgrim, MD 09/28/2020 2:56 PM

## 2020-09-28 NOTE — Progress Notes (Signed)
Family Medicine Teaching Service Daily Progress Note Intern Pager: [phone]  Patient name: Anthony [name] Medical record number: [id number] Date of birth: Dec 11, 1955 Age: 65 y.o. Gender: male  Primary Care Provider: Carollee Leitz, MD Consultants: Cardiology, Psychiatry  Code Status: Full  Pt Overview and Major Events to Date:  1/25 admitted 1/29 medically stable for discharge to supervised residential setting  Assessment and Plan: Mr. Matulich a 65 year old male who presented to the hospital with urosepsis and A. fib with RVR due to medication non-adherence;these have been treated and resolved.PMH is significant forA Fib, alcohol use disorder, HFpEF, HTN, chronic back pain, anxiety, depression, right PE.Patient is medically stable for discharge to supervised residential setting.   Difficult Placement  Lacks Capacity Stable.  Patient has been medically stable since 01/22. Difficult placement due to cognitive/behavorial deficits.  Patient does not have family members to help and cannot care for himself.   Per Psych eval 03/1, he does have capacity make medical decisions for himself but questionable if he has physical ability to live independently.  -CSW following to help with placement  Hospital acquired pneumonia Stable.  Vital signs stable and afebrile.  Completed course of Ceftriaxone (03/08-12) -Repeat chest xray 3-4 weeks   Shortness of breath Stable. Unclear etiology.  Patient reports has been ongoing prior to hospitalization.  Exertional SOB. Denies any cough or chest pain.  Last ECHO in 01/22 LVEF 55-60% with normal LV function.  No valvular dysfunction.  Low suspicion for anemia as cause of SOB given patient on iron supplementation and last Hbg 9.5.  Not concerning for CHF given normal exam and last EF wnl without valvular disease.  Patient is on Amiodarone and this could potentially be contributing to SOB although patient reports history of SOB prior to admission and  exertional.  Likely deconditioned state contributing to symptoms. Awaiting repeat ECHO.  IF wnl will likely need Cards consult for evaluation and possible stress testing. -Continue to monitor  Iron deficiency anemia Dyspnea on exertion:   Stable. -Continue FeSO4 daily -Recheck CBC 04/28  Chronic Back Pain:  Stable. -Continue Tylenol 1gm TID -Contniue Capsaicin prn -Continue Voltaren gel as needed -Continue Baclofen 5 mg TID  Xerosis:  Stable -Continue moisturizing lotion BID -Atarax 5 mg prn  Recurrent Hypomagnesemia:  Stable -Continue Mag Chloride daily -BMP qweekly on Mondays  Paroxysmal A-Fib, RVR resolved:  Stable. Rate controlled -Continue Metoprolol 25 mg daily -Continue Amiodarone 200 mg daily, if continues to have SOB consider discontinuing -Continue Xarelto 20 mg daily  Alcohol Use Disorder: Stable. -Continue Thiamine, Folic Acid and Multivitamins daily  MDD  Anxiety: Stable -Continue Sertraline 200 mg daily  Malnutrition Stable -Continue to encourage po supplementation with Ensure    FEN/GI: Regular diet PPx: Xarelto   Status is: Inpatient  Remains inpatient appropriate because:Unsafe d/c plan and Awaiting Placement   Dispo:  Patient From:    Planned Disposition:    Medically stable for discharge:       Subjective:  No acute events overnight.  No new concerns today.  No worsening shortness of breath or chest pain.   Objective: Temp:  [98.1 F (36.7 C)-98.6 F (37 C)] 98.4 F (36.9 C) (03/12 1941) Pulse Rate:  [75-83] 80 (03/12 1941) Resp:  [16-18] 18 (03/12 1941) BP: (106-118)/(68-79) 110/72 (03/12 1941) SpO2:  [96 %-100 %] 97 % (03/12 1941)  Physical Exam:  General: 65 y.o. male in NAD Cardio: RRR no m/r/g Lungs: CTAB, no wheezing, no rhonchi, no crackles, no IWOB on room air  Abdomen: Soft, non-tender to palpation, non-distended, positive bowel sounds Skin: warm and dry Extremities: No edema  Laboratory: Recent Labs   Lab 09/24/20 0010  WBC 7.5  HGB 9.5*  HCT 29.6*  PLT 289   Recent Labs  Lab 09/23/20 0435  NA 135  K 4.3  CL 105  CO2 22  BUN 18  CREATININE 1.02  CALCIUM 9.4  GLUCOSE 106*      Imaging/Diagnostic Tests: No results found.  Carollee Leitz, MD 09/28/2020, 8:45 PM PGY-2, [facility] Intern pager: [phone], text pages welcome

## 2020-09-29 ENCOUNTER — Inpatient Hospital Stay (HOSPITAL_COMMUNITY): Payer: Medicare Other

## 2020-09-29 DIAGNOSIS — I4891 Unspecified atrial fibrillation: Secondary | ICD-10-CM | POA: Diagnosis not present

## 2020-09-29 DIAGNOSIS — R0600 Dyspnea, unspecified: Secondary | ICD-10-CM | POA: Diagnosis not present

## 2020-09-29 DIAGNOSIS — Z659 Problem related to unspecified psychosocial circumstances: Secondary | ICD-10-CM | POA: Diagnosis not present

## 2020-09-29 DIAGNOSIS — Y95 Nosocomial condition: Secondary | ICD-10-CM

## 2020-09-29 DIAGNOSIS — J189 Pneumonia, unspecified organism: Secondary | ICD-10-CM | POA: Diagnosis not present

## 2020-09-29 LAB — ECHOCARDIOGRAM COMPLETE
AR max vel: 2.11 cm2
AV Area VTI: 2.36 cm2
AV Area mean vel: 2.21 cm2
AV Mean grad: 3 mmHg
AV Peak grad: 4.8 mmHg
Ao pk vel: 1.1 m/s
Area-P 1/2: 4.57 cm2
Height: 68 in
MV M vel: 4.19 m/s
MV Peak grad: 70.2 mmHg
MV VTI: 2.15 cm2
Radius: 0.3 cm
S' Lateral: 2.5 cm
Single Plane A4C EF: 76.6 %
Weight: 2525.59 oz

## 2020-09-29 LAB — BRAIN NATRIURETIC PEPTIDE: B Natriuretic Peptide: 252.8 pg/mL — ABNORMAL HIGH (ref 0.0–100.0)

## 2020-09-29 LAB — URIC ACID: Uric Acid, Serum: 5.3 mg/dL (ref 3.7–8.6)

## 2020-09-29 NOTE — Progress Notes (Signed)
Family Medicine Teaching Service Daily Progress Note Intern Pager: [phone]  Patient name: Anthony [name] Medical record number: [id number] Date of birth: Apr 01, 1956 Age: 65 y.o. Gender: male  Primary Care Provider: Carollee Leitz, MD Consultants: Cardiology, psychiatry Code Status: Full  Pt Overview and Major Events to Date:  1/25 admitted 1/29 medically stable for discharge to supervised residential setting  Assessment and Plan: Anthony [name] a 65 year old male who presented to the hospital with urosepsis and A. fib with RVR due to medication non-adherence;these have been treated and resolved.PMH is significant forA Fib, alcohol use disorder, HFpEF, HTN, chronic back pain, anxiety, depression, right PE.Patient is medically stable for discharge to supervised residential setting.   Difficult Placement Stable. Difficult placement secondary to cognitive/behavioral deficits and inability to care for himself Psych evaluation on 3/12 reported patient has capacity but unlikely that he is able to live independently d/t physical condition. Psych mentioned to consider legal competency evaluation. -CSW following to help with placement  Right ankle tenderness and swelling, improving Xray obtained on 3/13 negative for fracture. No ankle instability, tenderness to palpation is improved per patient.  - Continue to monitor for improvement - Tylenol PRN for pain  Shortness of breath Dyspnea on exertion  Stable. Unclear etiology. BNP 252.8; echo showed EF 60-65% with grade II diastolic dysfunction, severe dilation of LA, mild dilation of RA. Consulting cardiology for evaluation of heart failure with concern for possible amiodarone contributing to clinical picture, though difficult to assess as patient has significant deconditioning. - Continue to monitor - Consulting cardiology  Iron deficiency anemia Stable. -Continue FeSO4 daily -Recheck CBC 04/28  Chronic Back Pain: Stable. -Continue  Tylenol 1gm TID -Contniue Capsaicin prn -Continue Voltaren gel as needed -Continue Baclofen 5 mg TID  Xerosis: Stable -Continue moisturizing lotion BID -Atarax 5 mg prn  Recurrent Hypomagnesemia: Stable -Continue Mag Chloride daily -BMP qweekly on Mondays  Paroxysmal A-Fib, RVR resolved: Stable. Rate controlled -Continue Metoprolol 25 mg daily -Continue Amiodarone 200 mg daily, if continues to have SOB consider discontinuing -Continue Xarelto 20 mg daily  Alcohol Use Disorder:Stable. -Continue Thiamine, Folic Acid and Multivitamins daily  MDD  Anxiety:Stable -Continue Sertraline 200 mg daily  Malnutrition Stable -Continue to encourage po supplementation with Ensure   Hospital acquired pneumonia, resolved  Stable.  VSS, patient remains afebrile.  Completed CTX (03/08-12). -Repeat chest xray 3-4 weeks (around beginning of April)   FEN/GI: Regular diet PPx: Xarelto   Status is: Inpatient  Remains inpatient appropriate because:Unsafe d/c plan   Dispo:  Patient From:  Home  Planned Disposition:   SNF  Medically stable for discharge:  Yes, patient is stable for discharge      Subjective:  Patient was sleeping initially, he reports that he feels pretty tired today. Otherwise he has no complaints, just a request for Ensure for his coffee.   Objective: Temp:  [98 F (36.7 C)-98.2 F (36.8 C)] 98 F (36.7 C) (03/14 0333) Pulse Rate:  [76-78] 78 (03/14 0333) Resp:  [18] 18 (03/14 0333) BP: (111-113)/(81-84) 112/81 (03/14 0333) SpO2:  [98 %] 98 % (03/14 0333) Weight:  [70.4 kg] 70.4 kg (03/14 0607) Physical Exam: General: NAD, initially sleeping on exam, easily awoken Cardiovascular: RRR, no m/r/g appreciated Respiratory: CTAB, no increased WOB, comfortable on room air Abdomen: soft, non-tender, non-distended Extremities: moves all extremities appropriately, trace swelling of the superior aspect of the right malleolus with some associated tenderness (reports  improved from prior)  Laboratory: Recent Labs  Lab 09/24/20 0010  WBC 7.5  HGB 9.5*  HCT 29.6*  PLT 289     Imaging/Diagnostic Tests: DG Ankle Complete Right  Result Date: 09/29/2020 CLINICAL DATA:  RIGHT ankle swelling. EXAM: RIGHT ANKLE - COMPLETE 3+ VIEW COMPARISON:  None FINDINGS: Signs of prior trauma to the RIGHT ankle. Healed fractures of the distal fibula/lateral malleolus and signs of fixation of medial malleolus. Ankle mortise is preserved. Mild soft tissue swelling along the lateral malleolus without visible fracture. No signs of dislocation. Vascular calcifications in the soft tissues. Degenerative changes about the ankle related to posttraumatic changes. Question of subluxation and or hammertoe deformities about the toes, difficult to establish which of these toes may be involved IMPRESSION: 1. Mild soft tissue swelling along the lateral malleolus without visible fracture. 2. Degenerative changes about the ankle related to posttraumatic changes. 3. Question of subluxation and or hammertoe deformities about the toes, difficult to establish which of these toes may be involved. Correlate with any pain about the forefoot with dedicated imaging as warranted. Electronically Signed   By: Zetta Bills M.D.   On: 09/29/2020 19:31   ECHOCARDIOGRAM COMPLETE  Result Date: 09/29/2020    ECHOCARDIOGRAM REPORT   Patient Name:   Anthony [name] Date of Exam: 09/29/2020 Medical Rec #:  [id number]    Height:       68.0 in Accession #:    [id number]   Weight:       157.8 lb Date of Birth:  02/01/1956    BSA:          1.848 m Patient Age:    13 years     BP:           117/80 mmHg Patient Gender: M            HR:           87 bpm. Exam Location:  Inpatient Procedure: 2D Echo, 3D Echo, Cardiac Doppler, Color Doppler and Strain Analysis Indications:    Dyspnea  History:        Patient has prior history of Echocardiogram examinations, most                 recent 07/26/2020. Risk Factors:Hypertension.   Sonographer:    Luisa Hart RDCS Referring Phys: [place]  1. Tracking is not accurate on GLS measurements.  2. Left ventricular ejection fraction, by estimation, is 60 to 65%. The left ventricle has normal function. The left ventricle has no regional wall motion abnormalities. Left ventricular diastolic parameters are consistent with Grade II diastolic dysfunction (pseudonormalization). Elevated left ventricular end-diastolic pressure.  3. Right ventricular systolic function is normal. The right ventricular size is normal. There is normal pulmonary artery systolic pressure.  4. Left atrial size was severely dilated.  5. Right atrial size was mildly dilated.  6. The mitral valve is normal in structure. Trivial mitral valve regurgitation. No evidence of mitral stenosis.  7. The aortic valve is tricuspid. Aortic valve regurgitation is not visualized. No aortic stenosis is present.  8. The inferior vena cava is normal in size with greater than 50% respiratory variability, suggesting right atrial pressure of 3 mmHg. FINDINGS  Left Ventricle: Left ventricular ejection fraction, by estimation, is 60 to 65%. The left ventricle has normal function. The left ventricle has no regional wall motion abnormalities. The left ventricular internal cavity size was normal in size. There is  no left ventricular hypertrophy. Left ventricular diastolic parameters are consistent with Grade II diastolic dysfunction (pseudonormalization). Elevated left ventricular  end-diastolic pressure. Right Ventricle: The right ventricular size is normal. No increase in right ventricular wall thickness. Right ventricular systolic function is normal. There is normal pulmonary artery systolic pressure. The tricuspid regurgitant velocity is 2.69 m/s, and  with an assumed right atrial pressure of 3 mmHg, the estimated right ventricular systolic pressure is 25.0 mmHg. Left Atrium: Left atrial size was severely dilated. Right Atrium:  Right atrial size was mildly dilated. Pericardium: There is no evidence of pericardial effusion. Mitral Valve: The mitral valve is normal in structure. Trivial mitral valve regurgitation. No evidence of mitral valve stenosis. MV peak gradient, 7.1 mmHg. The mean mitral valve gradient is 2.0 mmHg. Tricuspid Valve: The tricuspid valve is normal in structure. Tricuspid valve regurgitation is trivial. No evidence of tricuspid stenosis. Aortic Valve: The aortic valve is tricuspid. Aortic valve regurgitation is not visualized. No aortic stenosis is present. Aortic valve mean gradient measures 3.0 mmHg. Aortic valve peak gradient measures 4.8 mmHg. Aortic valve area, by VTI measures 2.36 cm. Pulmonic Valve: The pulmonic valve was normal in structure. Pulmonic valve regurgitation is not visualized. No evidence of pulmonic stenosis. Aorta: The aortic root is normal in size and structure. Venous: The inferior vena cava is normal in size with greater than 50% respiratory variability, suggesting right atrial pressure of 3 mmHg. IAS/Shunts: No atrial level shunt detected by color flow Doppler.  LEFT VENTRICLE PLAX 2D LVIDd:         4.80 cm      Diastology LVIDs:         2.50 cm      LV e' medial:    7.94 cm/s LV PW:         0.70 cm      LV E/e' medial:  13.5 LV IVS:        0.80 cm      LV e' lateral:   10.30 cm/s LVOT diam:     1.90 cm      LV E/e' lateral: 10.4 LV SV:         52 LV SV Index:   28           2D Longitudinal Strain LVOT Area:     2.84 cm     2D Strain GLS (A2C):   -10.1 %                             2D Strain GLS (A3C):   -16.1 %                             2D Strain GLS (A4C):   -15.7 % LV Volumes (MOD)            2D Strain GLS Avg:     -14.0 % LV vol d, MOD A4C: 122.0 ml LV vol s, MOD A4C: 28.6 ml LV SV MOD A4C:     122.0 ml                             3D Volume EF:                             3D EF:        32 %  LV EDV:       134 ml                             LV ESV:       91 ml                              LV SV:        43 ml RIGHT VENTRICLE RV S prime:     11.70 cm/s TAPSE (M-mode): 2.7 cm LEFT ATRIUM           Index       RIGHT ATRIUM           Index LA diam:      4.00 cm 2.16 cm/m  RA Area:     26.60 cm LA Vol (A2C): 84.8 ml 45.88 ml/m RA Volume:   83.80 ml  45.34 ml/m  AORTIC VALVE                   PULMONIC VALVE AV Area (Vmax):    2.11 cm    PV Vmax:       0.64 m/s AV Area (Vmean):   2.21 cm    PV Vmean:      50.200 cm/s AV Area (VTI):     2.36 cm    PV VTI:        0.133 m AV Vmax:           110.00 cm/s PV Peak grad:  1.6 mmHg AV Vmean:          77.400 cm/s PV Mean grad:  1.0 mmHg AV VTI:            0.220 m AV Peak Grad:      4.8 mmHg AV Mean Grad:      3.0 mmHg LVOT Vmax:         81.80 cm/s LVOT Vmean:        60.400 cm/s LVOT VTI:          0.183 m LVOT/AV VTI ratio: 0.83  AORTA Ao Root diam: 3.00 cm Ao Asc diam:  3.30 cm MITRAL VALVE                 TRICUSPID VALVE MV Area (PHT): 4.57 cm      TR Peak grad:   28.9 mmHg MV Area VTI:   2.15 cm      TR Vmax:        269.00 cm/s MV Peak grad:  7.1 mmHg MV Mean grad:  2.0 mmHg      SHUNTS MV Vmax:       1.33 m/s      Systemic VTI:  0.18 m MV Vmean:      65.0 cm/s     Systemic Diam: 1.90 cm MV Decel Time: 166 msec MR Peak grad:    70.2 mmHg MR Mean grad:    48.0 mmHg MR Vmax:         419.00 cm/s MR Vmean:        331.0 cm/s MR PISA:         0.57 cm MR PISA Eff ROA: 3 mm MR PISA Radius:  0.30 cm MV E velocity: 107.00 cm/s MV A velocity: 38.10 cm/s MV E/A ratio:  2.81 Skeet Latch MD Electronically signed by Skeet Latch MD Signature Date/Time: 09/29/2020/1:46:06 PM    Final  Rise Patience, DO 09/30/2020, 9:44 AM PGY-1, [place] Intern pager: [phone], text pages welcome

## 2020-09-29 NOTE — Progress Notes (Signed)
Curbsided Dr. Audie Box, cardiology, re: persistent dyspnea. Patient finished tx for HAP, is presently on qod fe supplementation, continues to complain of dyspnea. Echo obtained today close to normal: EF 60%, G2DD. Dr. Audie Box does not suspect dyspnea to be a sx of amio toxicity in someone with use less than 1 year with recent chest imaging without signs of fibrosis. Recommends checking BNP for CHF, will obtain with weekly labs tomorrow. If abnormal, will request formal consult.  Gladys Damme, MD [facility], PGY-2

## 2020-09-30 DIAGNOSIS — I4891 Unspecified atrial fibrillation: Secondary | ICD-10-CM | POA: Diagnosis not present

## 2020-09-30 LAB — MAGNESIUM: Magnesium: 1.7 mg/dL (ref 1.7–2.4)

## 2020-09-30 LAB — BASIC METABOLIC PANEL
Anion gap: 8 (ref 5–15)
BUN: 16 mg/dL (ref 8–23)
CO2: 23 mmol/L (ref 22–32)
Calcium: 9.4 mg/dL (ref 8.9–10.3)
Chloride: 107 mmol/L (ref 98–111)
Creatinine, Ser: 0.81 mg/dL (ref 0.61–1.24)
GFR, Estimated: >60 mL/min (ref >60)
Glucose, Bld: 117 mg/dL — ABNORMAL HIGH (ref 70–99)
Potassium: 4.3 mmol/L (ref 3.5–5.1)
Sodium: 138 mmol/L (ref 135–145)

## 2020-09-30 MED ORDER — FUROSEMIDE 10 MG/ML IJ SOLN
40.0000 mg | Freq: Every day | INTRAMUSCULAR | Status: DC
  Administered 2020-09-30 – 2020-10-01 (×2): 40 mg via INTRAVENOUS
  Filled 2020-09-30 (×2): qty 4

## 2020-09-30 NOTE — Progress Notes (Signed)
Family Medicine Teaching Service Daily Progress Note Intern Pager: [phone]  Patient name: Anthony [name] [place] Medical record number: [id number] Date of birth: [birth year] Age: 65 y.o. Gender: male  Primary Care Provider: Carollee Leitz, MD Consultants: Cardiology, psychiatry Code Status: Full  Pt Overview and Major Events to Date:  1/25 admitted 1/29 medically stable for discharge to supervised residential setting 3/14 Psych - pt has capacity  Assessment and Plan: Anthony [name] a 65 year old male who presented to the hospital with urosepsis and A. fib with RVR due to medication non-adherence;these have been treated and resolved.PMH is significant forA Fib, alcohol use disorder, HFpEF, HTN, chronic back pain, anxiety, depression, right PE.Patient is medically stable for discharge to supervised residential setting.   Difficult Placement Stable. Difficult placement secondary to cognitive/behavioral deficits and inability to care for himself. Psych mentioned to consider legal competency evaluation. -CSW following to help with placement  Right ankle tenderness and swelling, improving Patient reports minimal tenderness to superior aspect of right lateral malleolus - Continue to monitor for improvement - Tylenol PRN for pain  Shortness of breath  Dyspnea on exertion   HF (Grade 2 diastolic dysfunction) Stable. Unclear etiology. Cardiology stated that with G2DD, patient has some component of heart failure. Started on low dose IV lasix. Weight 70.4>69.2, no output measured.  - Continue IV Lasix  - Continue to monitor - Consulting cardiology  Iron deficiency anemiaStable. -Continue FeSO4 daily -Recheck CBC 04/28  Chronic Back Pain: Stable. -Continue Tylenol 1gm TID -Contniue Capsaicin prn -Continue Voltaren gel as needed -Continue Baclofen 5 mg TID  Xerosis: Stable -Continue moisturizing lotion BID -Atarax 5 mg prn  Recurrent Hypomagnesemia: Stable -Continue Mag Chloride  daily -BMP qweekly on Mondays  Paroxysmal A-Fib, RVR resolved: Stable. Rate controlled -Continue Metoprolol 25 mg daily -Continue Amiodarone 200 mg daily, if continues to have SOB consider discontinuing -Continue Xarelto 20 mg daily  Alcohol Use Disorder:Stable. -Continue Thiamine, Folic Acid and Multivitamins daily  MDD   Anxiety:Stable -Continue Sertraline 200 mg daily  Malnutrition Stable -Continue to encourage po supplementation with Ensure   Hospital acquired pneumonia, resolved  Stable.  VSS, patient remains afebrile.  Completed CTX (03/08-12). -Repeat chest xray 3-4 weeks (around beginning of April)   FEN/GI: Regular diet PPx: Xarelto   Status is: Inpatient   Dispo:  Patient From:  Home  Planned Disposition:  ALF/SNF  Medically stable for discharge:  Yes, difficult to place      Subjective:  Patient reports that we are still not addressing his main concern for shortness of breath and is insistent that he should have a stress test while inpatient because if he discharges he will be "on my own", which he will not explain why he would not be able to go to outpatient appointments. He states that he gets short of breath with showering and walking. Patient reports dissatisfaction with hospital stay and that he is not getting what he wants.   Objective: Temp:  [97.8 F (36.6 C)-98.1 F (36.7 C)] 98 F (36.7 C) (03/15 0358) Pulse Rate:  [72-78] 72 (03/15 0358) Resp:  [16-17] 17 (03/15 0358) BP: (108-114)/(77-82) 114/82 (03/15 0358) SpO2:  [97 %-100 %] 100 % (03/15 0358) Weight:  [69.2 kg] 69.2 kg (03/15 0500) Physical Exam: General: NAD, sitting up in bed with papers on bed and phone out as he had been attempting to call someone Cardiovascular: NSR on exam today Respiratory: CTAB, comfortable on room air, no increased WOB Extremities: Minimal swelling around right lateral malleolus with some  associated pain, no other swelling noted currently by my exam.    Laboratory: No results for input(s): WBC, HGB, HCT, PLT in the last 168 hours. Recent Labs  Lab 09/30/20 0719  NA 138  K 4.3  CL 107  CO2 23  BUN 16  CREATININE 0.81  CALCIUM 9.4  GLUCOSE 117*    Imaging/Diagnostic Tests: No results found.   Rise Patience, DO 10/01/2020, 7:01 AM PGY-1, [place] Intern pager: [phone], text pages welcome

## 2020-09-30 NOTE — TOC Progression Note (Signed)
Transition of Care [facility]) - Progression Note    Patient Details  Name: DIVANTE KOTCH MRN: [id number] Date of Birth: 01/18/1956  [facility] CM/SW [place], [place] Phone Number: 09/30/2020, 3:10 PM  Clinical Narrative:     CSW spoke with pt concerning discharge plan. Pt immediately began talking about his pain medication and shortness of breath and not getting the care he feels like he should have. Pt reports he would like a dentist to come in and pull his bad teeth. CSW explained that he can make an appointment with a dentist after leaving the hospital. Pt became upset saying we are not providing him with the care he needs. Pt inquired why he wasn't allowed to leave the unit or go outside. CSW explained it is for his safety and he cannot room the hospital.   CSW again explained that we needed to talk about his discharge plan. Pt stated he wanted CSW to get him another housing authority apartment. CSW explained that will not be possible due to the housing waitlist and the fact that he was evicted from his apartment late 2021.   CSW explained that his options are assisted living/ group home placement. CSW explained that if he went to one of those he will have the assistance he needs as far as meals, medications, transportation to appointments. CSW also explained that he will have to give a portion of his check to cover his stay. Pt stated he was fine with this.  Pt informed CSW that he will also needs to go somewhere where he will not have to clean or do chores because he gets short of breath. CSW explained that no matter where he lives he will have some type of personal responsibilities that he will have to complete. CSW also explained that pt will have certain rules to follow, pt states it depends on the rules because he is an adult and no one will tell him when to go to bed.   Psych has deemed that pt has capacity to make his own decisions.   CSW faxed referral to  [facility], [place]. CSW will continue to follow up and refer to other facilities.   Expected Discharge Plan: [place] Barriers to Discharge: Inadequate or no insurance,SNF Pending Medicaid,SNF Pending bed offer,SNF Pending payor source - LOG,Family Issues,Active Substance Use - Placement,Financial Resources,No SNF bed,Unsafe home situation  Expected Discharge Plan and Services Expected Discharge Plan: [place] arrangements for the past 2 months: Hotel/Motel                                       Social Determinants of Health (SDOH) Interventions    Readmission Risk Interventions No flowsheet data found.  Emeterio Reeve, Latanya Presser, [place] Social Worker [phone]

## 2020-09-30 NOTE — Consult Note (Addendum)
Cardiology Consultation:   Patient ID: Anthony [name]; [id number]; 1956/05/08   Admit date: 08/13/2020 Date of Consult: 09/30/2020  Primary Care Provider: Carollee Leitz, MD Primary Cardiologist: Dr. Lauree Chandler, MD  Patient Profile:   Anthony [name] is a 65 y.o. male with a hx of atrial fibrillation, HTN, EtOH abuse, diastolic heart failure, chronic back pain, depression and unprovoked PE who is being seen today for the evaluation of SOB/DOE at the request of Dr. Volanda Napoleon.  History of Present Illness:   Anthony [name] is a 65 year old male with a history as stated above who presented to [facility] on 08/13/20. He was last evaluated by cardiology 08/14/2020 for the evaluation of atrial fibrillation.  He was admitted 1/2-1/22/2022 for alcohol withdrawal which was complicated by altered mental status and development of new onset atrial fibrillation.  During that admission, echocardiogram showed an LVEF at 55 to 60% with no regional wall motion abnormalities, G2 DD.  He was ultimately discharged to a hotel with social work following for outpatient assistance.  He presented back to the emergency department 08/13/2020 after social worker went to check on him and he was found naked in his room covered in urine and feces and was markedly confused. UDS was positive for benzodiazepines.  EKG showed atrial fibrillation with RVR at a rate of 150 bpm. He had been discharged on Toprol 12.5 mg daily along with Xarelto 20 mg daily however he did not take any of his medications after hospital discharge. Given his social issues along with his ongoing EtOH use, he was not felt to be a good candidate for long-term anticoagulation unless discharge was planned to a controlled environment like SNF.  He was also not felt to be a good candidate for cardioversion.  Cardiology ultimately signed off 08/19/2020 with plans for amiodarone 200 mg twice daily until 08/23/2020 then transition to 200 mg daily.  Plan was for SNF discharge.     Unfortunately, since that time there has been much difficulty in finding placement secondary to cognitive/behavioral deficits and inability to care for himself.  He underwent a psychiatric evaluation 09/28/2020 with reported patient capacity however was felt that he is unable to live independently due to physical condition with mention to consider legal competency evaluation.  Cardiology has been asked to evaluate due to DOE felt to be secondary to amiodarone therapy. Dr. Audie Box was curb-sided yesterday for DOE and reported that he had finished tx for HAP and was not presently requiring supplementation. Repeat echo 09/29/20 is essentially unchanged from prior study 07/2020 with EF at 60% and G2DD. Dr. Audie Box  Did not suspect amio toxicity in someone with use less than 1 year with recent chest imaging without signs of fibrosis. He recommended obtaining BNP which was mildly elevated at 252.  Chest CT performed 09/23/2020 with patchy groundglass density in the left upper lobe with mild bibasilar atelectasis and scarring along with small calcific granuloma of the right upper lobe with no pleural effusion and no pneumothorax.   Past Medical History:  Diagnosis Date   Actinic keratosis 11/27/2016   Alcohol abuse with alcohol-induced mood disorder ([place]) 07/18/2018   Alcohol use disorder, severe, dependence ([place]) 09/16/2006   05/22/2015 Argumentative, belligerent and verbally abusive to staff per his note from [facility])    Anxiety state 04/25/2018   Chronic low back pain 09/16/2006   Followed by NS.  Per his chart from [place], he fell off two storyhouse roof while cleaning his brother's  gutters and sustained compression fracture of L3, L4 and L5 and fracture of right transverse process at L3 and nondisplaced fracture of left glenoid rim many years ago. He had multiple imaging in [place] including Lumbar MRI in 2016 which showed moderate to severe spinal canal stenos   Delirium  tremens ([place]) 09/01/2017   Depression    Dry eye 11/23/2016   Hiatal hernia 09/14/2016   Status Collis gastroplasty and Nissen's fundoplication on 70/17/7939 in [place]   History of delirium tremons 07/22/2020   DTs during 10/2016 08/2017. And 12/2017 admissions    History of pulmonary embolus (PE) 11/02/2016   Unprovoked 11/01/2016. Xarelto from 11/01/16 to 09/08/2017.   Hydrocele    Surgically corrected   Hypertension    Hypoalbuminemia due to protein-calorie malnutrition ([place]) 07/22/2020   Lumbar compression fracture ([facility])    Multiple rib fractures 07/22/2019   Left rib details XR: left ribs demonstrate multiple remote rib   Pancytopenia ([place])    Rhabdomyolysis 07/22/2020   Skin cancer    exciced 2017   Tinea versicolor 06/08/2013   Tobacco use disorder 07/22/2020   Tooth infection 04/25/2018   Trigger finger, acquired 11/18/2012   Ulnar tunnel syndrome of right wrist 11/08/2012    Past Surgical History:  Procedure Laterality Date   CATARACT EXTRACTION     HIATAL HERNIA REPAIR  2016   HYDROCELE EXCISION / REPAIR  2010   SKIN CANCER EXCISION Left    Excised 2017     Prior to Admission medications   Medication Sig Start Date End Date Taking? Authorizing Provider  ferrous sulfate 325 (65 FE) MG tablet Take 1 tablet (325 mg total) by mouth 2 (two) times a week. 08/08/20  Yes Zola Button, MD  folic acid (FOLVITE) 1 MG tablet Take 1 tablet (1 mg total) by mouth daily. 08/09/20  Yes Welborn, Ryan, DO  magnesium oxide (MAG-OX) 400 (241.3 Mg) MG tablet Take 1 tablet (400 mg total) by mouth 2 (two) times daily. 08/09/20  Yes Welborn, Ryan, DO  metoprolol succinate (TOPROL-XL) 25 MG 24 hr tablet Take 0.5 tablets (12.5 mg total) by mouth daily. 08/09/20  Yes Welborn, Ryan, DO  rivaroxaban (XARELTO) 20 MG TABS tablet Take 1 tablet (20 mg total) by mouth daily with supper. 08/08/20  Yes Zola Button, MD  sertraline (ZOLOFT) 100 MG tablet Take 2 tablets (200 mg total) by mouth daily. 08/09/20  Yes  Welborn, Ryan, DO  thiamine 100 MG tablet Take 1 tablet (100 mg total) by mouth daily. 08/09/20  Yes Lurline Del, DO    Inpatient Medications: Scheduled Meds:  acetaminophen  650 mg Oral Q6H   amiodarone  200 mg Oral Daily   baclofen  5 mg Oral TID   capsaicin   Topical BID   chlorhexidine  15 mL Mouth/Throat BID   dextromethorphan-guaiFENesin  1 tablet Oral BID   feeding supplement  237 mL Oral BID BM   ferrous sulfate  325 mg Oral Q breakfast   folic acid  1 mg Oral Daily   loratadine  10 mg Oral Daily   magnesium chloride  1 tablet Oral Daily   metoprolol succinate  25 mg Oral Daily   multivitamin with minerals  1 tablet Oral Daily   pneumococcal 23 valent vaccine  0.5 mL Intramuscular Tomorrow-1000   rivaroxaban  20 mg Oral Q supper   sertraline  100 mg Oral Daily   thiamine  100 mg Oral Daily   Continuous Infusions:  sodium chloride Stopped (08/16/20 0118)  PRN Meds: sodium chloride, benzocaine, calcium carbonate, camphor-menthol, diclofenac Sodium, hydroxypropyl methylcellulose / hypromellose, Muscle Rub  Allergies:   No Known Allergies  Social History:   Social History   Socioeconomic History   Marital status: Single    Spouse name: Not on file   Number of children: Not on file   Years of education: Not on file   Highest education level: Not on file  Occupational History   Occupation: unemployed  Tobacco Use   Smoking status: Never Smoker   Smokeless tobacco: Current User    Types: Snuff  Vaping Use   Vaping Use: Never used  Substance and Sexual Activity   Alcohol use: Yes    Alcohol/week: 43.0 standard drinks    Types: 42 Cans of beer, 1 Standard drinks or equivalent per week    Comment: daily use-6 beers daily and whiskey when he has it.   Drug use: Yes    Types: Marijuana    Comment: occasional-none this year per pt   Sexual activity: Yes    Birth control/protection: None  Other Topics Concern   Not on file  Social History Narrative   Lives  with a friend. Has children but hasn't met them in a while. He doesn't know where they live.    Social Determinants of Health   Financial Resource Strain: Not on file  Food Insecurity: Not on file  Transportation Needs: Not on file  Physical Activity: Not on file  Stress: Not on file  Social Connections: Not on file  Intimate Partner Violence: Not on file    Family History:   Family History  Problem Relation Age of Onset   Heart attack Father        died at 62 years   Dementia Father    Stroke Father    Cancer Sister    Colon cancer Neg Hx    Colon polyps Neg Hx    Esophageal cancer Neg Hx    Stomach cancer Neg Hx    Rectal cancer Neg Hx    Family Status:  Family Status  Relation Name Status   Mother  Deceased at age 6   Father  Deceased at age 70   Sister  Alive   Brother  Alive   Neg Hx  (Not Specified)    ROS:  Please see the history of present illness.  All other ROS reviewed and negative.     Physical Exam/Data:   Vitals:   09/29/20 1448 09/29/20 1950 09/30/20 0333 09/30/20 0607  BP: 111/84 113/82 112/81   Pulse: 76 77 78   Resp: _0 Temp: 98.1 F (36.7 C) 98.2 F (36.8 C) 98 F (36.7 C)   TempSrc: Oral Oral Oral   SpO2: 98% 98% 98%   Weight:    70.4 kg  Height:        Intake/Output Summary (Last 24 hours) at 09/30/2020 1430 Last data filed at 09/30/2020 0330 Gross per 24 hour  Intake 1200 ml  Output --  Net 1200 ml   Filed Weights   09/27/20 0355 09/29/20 0437 09/30/20 0607  Weight: 71.1 kg 71.6 kg 70.4 kg   Body mass index is 23.6 kg/m.   General: Elderly, NAD Neck: Negative for carotid bruits. No JVD Lungs:Clear to ausculation bilaterally. No wheezes, rales, or rhonchi. Breathing is unlabored. Cardiovascular: RRR with S1 S2. No murmurs Abdomen: Soft, non-tender, non-distended. No obvious abdominal masses. Extremities: No edema. Radial pulses 2+ bilaterally Neuro: Alert and oriented.  No focal deficits. No facial asymmetry. MAE  spontaneously. Psych: Responds to questions appropriately with normal affect.    Telemetry:  Telemetry was personally reviewed and demonstrates:  Not currently on telemetry   Relevant CV Studies:  Echo 09/29/20:   1. Tracking is not accurate on GLS measurements.   2. Left ventricular ejection fraction, by estimation, is 60 to 65%. The  left ventricle has normal function. The left ventricle has no regional  wall motion abnormalities. Left ventricular diastolic parameters are  consistent with Grade II diastolic  dysfunction (pseudonormalization). Elevated left ventricular end-diastolic  pressure.   3. Right ventricular systolic function is normal. The right ventricular  size is normal. There is normal pulmonary artery systolic pressure.   4. Left atrial size was severely dilated.   5. Right atrial size was mildly dilated.   6. The mitral valve is normal in structure. Trivial mitral valve  regurgitation. No evidence of mitral stenosis.   7. The aortic valve is tricuspid. Aortic valve regurgitation is not  visualized. No aortic stenosis is present.   8. The inferior vena cava is normal in size with greater than 50%  respiratory variability, suggesting right atrial pressure of 3 mmHg.    Echo: 07/26/20    1. Left ventricular ejection fraction, by estimation, is 55 to 60%. The  left ventricle has normal function. The left ventricle has no regional  wall motion abnormalities. Left ventricular diastolic parameters are  consistent with Grade II diastolic  dysfunction (pseudonormalization).   2. Right ventricular systolic function is normal. The right ventricular  size is normal. Tricuspid regurgitation signal is inadequate for assessing  PA pressure.   3. The mitral valve is normal in structure. Trivial mitral valve  regurgitation. No evidence of mitral stenosis.   4. The aortic valve is tricuspid. Aortic valve regurgitation is not  visualized. No aortic stenosis is present.   5. Aortic  dilatation noted. There is mild dilatation of the aortic root,  measuring 39 mm.   6. The inferior vena cava is dilated in size with >50% respiratory  variability, suggesting right atrial pressure of 8 mmHg.   Laboratory Data:  Chemistry Recent Labs  Lab 09/30/20 0719  NA 138  K 4.3  CL 107  CO2 23  GLUCOSE 117*  BUN 16  CREATININE 0.81  CALCIUM 9.4  GFRNONAA >60  ANIONGAP 8    Total Protein  Date Value Ref Range Status  08/18/2020 5.6 (L) 6.5 - 8.1 g/dL Final  11/27/2016 6.8 6.0 - 8.5 g/dL Final   Albumin  Date Value Ref Range Status  08/18/2020 2.0 (L) 3.5 - 5.0 g/dL Final  11/27/2016 4.1 3.6 - 4.8 g/dL Final   AST  Date Value Ref Range Status  08/18/2020 42 (H) 15 - 41 U/L Final   ALT  Date Value Ref Range Status  08/18/2020 41 0 - 44 U/L Final   Alkaline Phosphatase  Date Value Ref Range Status  08/18/2020 93 38 - 126 U/L Final   Total Bilirubin  Date Value Ref Range Status  08/18/2020 0.5 0.3 - 1.2 mg/dL Final   Bilirubin Total  Date Value Ref Range Status  11/27/2016 0.3 0.0 - 1.2 mg/dL Final   Hematology Recent Labs  Lab 09/24/20 0010  WBC 7.5  RBC 2.93*  HGB 9.5*  HCT 29.6*  MCV 101.0*  MCH 32.4  MCHC 32.1  RDW 13.2  PLT 289   Cardiac EnzymesNo results for input(s): TROPONINI in the last 168 hours. No  results for input(s): TROPIPOC in the last 168 hours.  BNP Recent Labs  Lab 09/29/20 1558  BNP 252.8*    DDimer No results for input(s): DDIMER in the last 168 hours. TSH:  Lab Results  Component Value Date   TSH 2.849 07/23/2020   Lipids:No results found for: CHOL, HDL, LDLCALC, LDLDIRECT, TRIG, CHOLHDL HgbA1c: Lab Results  Component Value Date   HGBA1C 5.2 11/01/2016    Radiology/Studies:  DG Ankle Complete Right  Result Date: 09/29/2020 CLINICAL DATA:  RIGHT ankle swelling. EXAM: RIGHT ANKLE - COMPLETE 3+ VIEW COMPARISON:  None FINDINGS: Signs of prior trauma to the RIGHT ankle. Healed fractures of the distal  fibula/lateral malleolus and signs of fixation of medial malleolus. Ankle mortise is preserved. Mild soft tissue swelling along the lateral malleolus without visible fracture. No signs of dislocation. Vascular calcifications in the soft tissues. Degenerative changes about the ankle related to posttraumatic changes. Question of subluxation and or hammertoe deformities about the toes, difficult to establish which of these toes may be involved IMPRESSION: 1. Mild soft tissue swelling along the lateral malleolus without visible fracture. 2. Degenerative changes about the ankle related to posttraumatic changes. 3. Question of subluxation and or hammertoe deformities about the toes, difficult to establish which of these toes may be involved. Correlate with any pain about the forefoot with dedicated imaging as warranted. Electronically Signed   By: Zetta Bills M.D.   On: 09/29/2020 19:31   ECHOCARDIOGRAM COMPLETE  Result Date: 09/29/2020    ECHOCARDIOGRAM REPORT   Patient Name:   Anthony [name] Date of Exam: 09/29/2020 Medical Rec #:  [id number]    Height:       68.0 in Accession #:    [id number]   Weight:       157.8 lb Date of Birth:  February 25, 1956    BSA:          1.848 m Patient Age:    53 years     BP:           117/80 mmHg Patient Gender: M            HR:           87 bpm. Exam Location:  Inpatient Procedure: 2D Echo, 3D Echo, Cardiac Doppler, Color Doppler and Strain Analysis Indications:    Dyspnea  History:        Patient has prior history of Echocardiogram examinations, most                 recent 07/26/2020. Risk Factors:Hypertension.  Sonographer:    Luisa Hart RDCS Referring Phys: [place]  1. Tracking is not accurate on GLS measurements.  2. Left ventricular ejection fraction, by estimation, is 60 to 65%. The left ventricle has normal function. The left ventricle has no regional wall motion abnormalities. Left ventricular diastolic parameters are consistent with Grade II diastolic  dysfunction (pseudonormalization). Elevated left ventricular end-diastolic pressure.  3. Right ventricular systolic function is normal. The right ventricular size is normal. There is normal pulmonary artery systolic pressure.  4. Left atrial size was severely dilated.  5. Right atrial size was mildly dilated.  6. The mitral valve is normal in structure. Trivial mitral valve regurgitation. No evidence of mitral stenosis.  7. The aortic valve is tricuspid. Aortic valve regurgitation is not visualized. No aortic stenosis is present.  8. The inferior vena cava is normal in size with greater than 50% respiratory variability, suggesting right atrial pressure of  3 mmHg. FINDINGS  Left Ventricle: Left ventricular ejection fraction, by estimation, is 60 to 65%. The left ventricle has normal function. The left ventricle has no regional wall motion abnormalities. The left ventricular internal cavity size was normal in size. There is  no left ventricular hypertrophy. Left ventricular diastolic parameters are consistent with Grade II diastolic dysfunction (pseudonormalization). Elevated left ventricular end-diastolic pressure. Right Ventricle: The right ventricular size is normal. No increase in right ventricular wall thickness. Right ventricular systolic function is normal. There is normal pulmonary artery systolic pressure. The tricuspid regurgitant velocity is 2.69 m/s, and  with an assumed right atrial pressure of 3 mmHg, the estimated right ventricular systolic pressure is 29.5 mmHg. Left Atrium: Left atrial size was severely dilated. Right Atrium: Right atrial size was mildly dilated. Pericardium: There is no evidence of pericardial effusion. Mitral Valve: The mitral valve is normal in structure. Trivial mitral valve regurgitation. No evidence of mitral valve stenosis. MV peak gradient, 7.1 mmHg. The mean mitral valve gradient is 2.0 mmHg. Tricuspid Valve: The tricuspid valve is normal in structure. Tricuspid valve  regurgitation is trivial. No evidence of tricuspid stenosis. Aortic Valve: The aortic valve is tricuspid. Aortic valve regurgitation is not visualized. No aortic stenosis is present. Aortic valve mean gradient measures 3.0 mmHg. Aortic valve peak gradient measures 4.8 mmHg. Aortic valve area, by VTI measures 2.36 cm. Pulmonic Valve: The pulmonic valve was normal in structure. Pulmonic valve regurgitation is not visualized. No evidence of pulmonic stenosis. Aorta: The aortic root is normal in size and structure. Venous: The inferior vena cava is normal in size with greater than 50% respiratory variability, suggesting right atrial pressure of 3 mmHg. IAS/Shunts: No atrial level shunt detected by color flow Doppler.  LEFT VENTRICLE PLAX 2D LVIDd:         4.80 cm      Diastology LVIDs:         2.50 cm      LV e' medial:    7.94 cm/s LV PW:         0.70 cm      LV E/e' medial:  13.5 LV IVS:        0.80 cm      LV e' lateral:   10.30 cm/s LVOT diam:     1.90 cm      LV E/e' lateral: 10.4 LV SV:         52 LV SV Index:   28           2D Longitudinal Strain LVOT Area:     2.84 cm     2D Strain GLS (A2C):   -10.1 %                             2D Strain GLS (A3C):   -16.1 %                             2D Strain GLS (A4C):   -15.7 % LV Volumes (MOD)            2D Strain GLS Avg:     -14.0 % LV vol d, MOD A4C: 122.0 ml LV vol s, MOD A4C: 28.6 ml LV SV MOD A4C:     122.0 ml  3D Volume EF:                             3D EF:        32 %                             LV EDV:       134 ml                             LV ESV:       91 ml                             LV SV:        43 ml RIGHT VENTRICLE RV S prime:     11.70 cm/s TAPSE (M-mode): 2.7 cm LEFT ATRIUM           Index       RIGHT ATRIUM           Index LA diam:      4.00 cm 2.16 cm/m  RA Area:     26.60 cm LA Vol (A2C): 84.8 ml 45.88 ml/m RA Volume:   83.80 ml  45.34 ml/m  AORTIC VALVE                   PULMONIC VALVE AV Area (Vmax):    2.11 cm     PV Vmax:       0.64 m/s AV Area (Vmean):   2.21 cm    PV Vmean:      50.200 cm/s AV Area (VTI):     2.36 cm    PV VTI:        0.133 m AV Vmax:           110.00 cm/s PV Peak grad:  1.6 mmHg AV Vmean:          77.400 cm/s PV Mean grad:  1.0 mmHg AV VTI:            0.220 m AV Peak Grad:      4.8 mmHg AV Mean Grad:      3.0 mmHg LVOT Vmax:         81.80 cm/s LVOT Vmean:        60.400 cm/s LVOT VTI:          0.183 m LVOT/AV VTI ratio: 0.83  AORTA Ao Root diam: 3.00 cm Ao Asc diam:  3.30 cm MITRAL VALVE                 TRICUSPID VALVE MV Area (PHT): 4.57 cm      TR Peak grad:   28.9 mmHg MV Area VTI:   2.15 cm      TR Vmax:        269.00 cm/s MV Peak grad:  7.1 mmHg MV Mean grad:  2.0 mmHg      SHUNTS MV Vmax:       1.33 m/s      Systemic VTI:  0.18 m MV Vmean:      65.0 cm/s     Systemic Diam: 1.90 cm MV Decel Time: 166 msec MR Peak grad:    70.2 mmHg MR Mean grad:    48.0 mmHg MR Vmax:  419.00 cm/s MR Vmean:        331.0 cm/s MR PISA:         0.57 cm MR PISA Eff ROA: 3 mm MR PISA Radius:  0.30 cm MV E velocity: 107.00 cm/s MV A velocity: 38.10 cm/s MV E/A ratio:  2.81 Skeet Latch MD Electronically signed by Skeet Latch MD Signature Date/Time: 09/29/2020/1:46:06 PM    Final    Assessment and Plan:   1. DOE: -Most likely multifactorial given profound deconditioning however BNP marginally elevated at 254 with G2DD on echocardiogram therefore there is some degree of CHF. Will plan to start low dose IV Lasix and follow response. Creatinine is stable at 0.88.  -Low suspicion for PE given Xarelto -Not likely related to Amio given short duration of use and stable imaging  -Likely transition to PO dosing tomorrow if improvement   2. Atrial fibrillation with RVR:  -Maintaining NSR per ausculation -Continue current regimen with Amiodarone 243m QD along with Toprol XL 263mdaily and Xarelto 2026m-CHA2DS2-VASc Score of 2.  -Planning to discharge to controlled environment with SNF.     3. Hx  of PE:  -Unprovoked per chart review from 2018.  -Continue Xarelto   4. Social issues:  -Waiting on placed at SNF however complicated due to psych issues -Management per primary team   For questions or updates, please contact [facility] consult [email or web address] for contact info under Cardiology/STEMI.   [facility] Pager: [phone]/2022 2:30 PM  Personally seen and examined. Agree with APP above with the following comments: Briefly [age] M with prior ETOH, HTN, HFpEF who presents again with new SOB.  Has AF on amiodarone but has preferred not to have telemetry Patient notes DOE.  Notes that he cannot take a shower without shortness of breath. Exam notable for crackles in all lung fields. Labs notable for elevated BNP Personally reviewed relevant tests; elevated LAP and LVEDP on 3/13 echo, distal coronary calcium on prior CT chest.  Would recommend trial of lasix; BB, AC, and amiodarone maintenance is reasonable.    MahWerner LeanD

## 2020-10-01 DIAGNOSIS — R0600 Dyspnea, unspecified: Secondary | ICD-10-CM

## 2020-10-01 DIAGNOSIS — I4891 Unspecified atrial fibrillation: Secondary | ICD-10-CM | POA: Diagnosis not present

## 2020-10-01 DIAGNOSIS — F1020 Alcohol dependence, uncomplicated: Secondary | ICD-10-CM | POA: Diagnosis not present

## 2020-10-01 LAB — BASIC METABOLIC PANEL
Anion gap: 8 (ref 5–15)
BUN: 25 mg/dL — ABNORMAL HIGH (ref 8–23)
CO2: 25 mmol/L (ref 22–32)
Calcium: 9.8 mg/dL (ref 8.9–10.3)
Chloride: 104 mmol/L (ref 98–111)
Creatinine, Ser: 1.11 mg/dL (ref 0.61–1.24)
GFR, Estimated: >60 mL/min (ref >60)
Glucose, Bld: 91 mg/dL (ref 70–99)
Potassium: 3.7 mmol/L (ref 3.5–5.1)
Sodium: 137 mmol/L (ref 135–145)

## 2020-10-01 MED ORDER — METOPROLOL TARTRATE 50 MG PO TABS: 50.0000 mg | ORAL_TABLET | ORAL | Status: DC

## 2020-10-01 MED ORDER — METOPROLOL TARTRATE 50 MG PO TABS
50.0000 mg | ORAL_TABLET | ORAL | Status: AC
  Filled 2020-10-01: qty 1

## 2020-10-01 MED ORDER — FUROSEMIDE 40 MG PO TABS: 40.0000 mg | ORAL_TABLET | Freq: Every day | ORAL | Status: DC

## 2020-10-01 MED ORDER — FUROSEMIDE 20 MG PO TABS
20.0000 mg | ORAL_TABLET | Freq: Every day | ORAL | Status: DC
  Administered 2020-10-02 – 2020-10-09 (×8): 20 mg via ORAL
  Filled 2020-10-01 (×8): qty 1

## 2020-10-01 MED ORDER — FUROSEMIDE 20 MG PO TABS: 20.0000 mg | ORAL_TABLET | Freq: Every day | ORAL | Status: DC

## 2020-10-01 NOTE — TOC Progression Note (Signed)
Transition of Care [facility]) - Progression Note    Patient Details  Name: CORWYN VORA MRN: [id number] Date of Birth: Mar 20, 1956  [facility] CM/SW [place], [place] Phone Number: 10/01/2020, 4:03 PM  Clinical Narrative:     Pt completed virtual interview with admission coordinators from [facility] in [place]. Admission coordinators asked questions about likes/dislikes, hobbies and habits. They also gave pt information about the facilities, rules and regulations. Pt was unhappy that he will only be allowed to keep $66 dollars out of his check a month and that he will have a roommate. CSW and pt discussed these things after the call and pt ultimately stated he understood. CSW is waiting a call back from the admission coordinators on decision.   CSW discussed with supervisor. If pt is accepted and chooses not to accept bed offer MD will need to issue discharge order and pt will have to leave the hospital on his own.   TOC will follow.  Expected Discharge Plan: [place] Barriers to Discharge: Unsafe home situation,Family Issues,Financial Resources,Other (comment),Homeless with medical needs (No ALF bed)  Expected Discharge Plan and Services Expected Discharge Plan: [place] arrangements for the past 2 months: Hotel/Motel                                       Social Determinants of Health (SDOH) Interventions    Readmission Risk Interventions No flowsheet data found.  Emeterio Reeve, Latanya Presser, [place] Social Worker [phone]

## 2020-10-01 NOTE — Progress Notes (Signed)
Family Medicine Teaching Service Daily Progress Note Intern Pager: [phone]  Patient name: Anthony [name] Medical record number: [id number] Date of birth: 22-Jul-1955 Age: 65 y.o. Gender: male  Primary Care Provider: Carollee Leitz, MD Consultants: Cardiology Code Status: Full  Pt Overview and Major Events to Date:  1/25 admitted 1/29 medically stable for discharge to supervised residential setting 3/14 Psych - pt has capacity  Assessment and Plan: Anthony [name] a 65 year old male who presented to the hospital with urosepsis and A. fib with RVR due to medication non-adherence;these have been treated and resolved.PMH is significant forA Fib, alcohol use disorder, HFpEF, HTN, chronic back pain, anxiety, depression, right PE.Patient is medically stable for discharge to supervised residential setting.   Difficult Placement -CSW following to help with placement  Dark stools Overnight patient and RN reported dark tarry stool without abdominal pain. Hgb appropriate at 12.2, which is actually elevated from last labs on 3/8 of 8.5. Patient is currently on iron supplementation which can cause darker stools. Low suspicion for GI bleed but obtaining FOBT. - F/u FOBT  Shortness of breath Dyspnea on exertion  HF (Grade 2 diastolic dysfunction) Stable. Unclear etiology.On Lasix 20mg  daily. - Cardiology consulted - CCTA today, will follow-up results - Continue IV Lasix  - Continue to monitor - Cardiology consulted, appreciate recs  Iron deficiency anemiaStable. -Continue FeSO4 daily -Recheck CBC 04/28  Chronic Back Pain: Stable. -Continue Tylenol 1gm TID -Contniue Capsaicin prn -Continue Voltaren gel as needed -Continue Baclofen 5 mg TID  Xerosis: Stable -Continue moisturizing lotion BID -Atarax 5 mg prn  Recurrent Hypomagnesemia:Stable -Continue Mag Chloride daily -BMP qweekly on Mondays  Paroxysmal A-Fib, RVR resolved: Stable. Rate controlled -Continue Metoprolol 25  mg daily -Continue Amiodarone 200 mg daily, if continues to have SOB consider discontinuing -Continue Xarelto 20 mg daily  Alcohol Use Disorder:Stable. -Continue Thiamine, Folic Acid and Multivitamins daily  MDD  Anxiety:Stable -Continue Sertraline 200 mg daily  Malnutrition Stable -Continue to encourage po supplementation with Ensure   Hospital acquired pneumonia, resolved Stable.VSS, patient remains afebrile. Completed CTX(03/08-12). -Repeat chest xray 3-4 weeks(around beginning of April)   Right ankle tenderness and swelling, resolved   FEN/GI: Regular diet PPx: Xarelto  Status is: Inpatient  Dispo:             Patient From:  Home             Planned Disposition:  ALF/SNF             Medically stable for discharge:  Yes, difficult to place   Subjective:  Patient continues to report dissatisfaction with his care and that he was told this morning he was only getting medications that "were necessary" per the staff member administering the medications and he believed his pain medications were not being given and was upset that his pain treatment was "not important to anyone" (per [facility] the medications Tylenol and Baclofen were given). Patient reports that he is not sure if he will accept the ALF he interviewed with, though he has not made any decisions. He did mention that he may appeal the discharge and go the legal route, which patient has the right to do.   Objective: Temp:  [97.7 F (36.5 C)-98 F (36.7 C)] 97.7 F (36.5 C) (03/16 0500) Pulse Rate:  [80-107] 80 (03/16 0500) Resp:  [17-19] 17 (03/16 0500) BP: (97-116)/(73-86) 97/73 (03/16 0500) SpO2:  [98 %] 98 % (03/16 0500) Physical Exam: General: NAD, supine in bed, well-appearing Cardiovascular: no peripheral edema noted,  2+ posterior tibial pulses Respiratory: comfortable on room air, no increased WOB, equal chest Anthony, able to speak in full sentences  Extremities: moving all extremities  appropriately, no reported tenderness with palpation of right ankle, no peripheral edema noted  Laboratory: Recent Labs  Lab 10/02/20 0212  WBC 6.7  HGB 12.2*  HCT 36.7*  PLT 374   Recent Labs  Lab 09/30/20 0719 10/01/20 1127  NA 138 137  K 4.3 3.7  CL 107 104  CO2 23 25  BUN 16 25*  CREATININE 0.81 1.11  CALCIUM 9.4 9.8  GLUCOSE 117* 91      Imaging/Diagnostic Tests: No results found.   Anthony Patience, DO 10/02/2020, 7:43 AM PGY-1, [place] Intern pager: [phone], text pages welcome

## 2020-10-01 NOTE — Progress Notes (Addendum)
Progress Note  Patient Name: Anthony [name] Date of Encounter: 10/01/2020  Primary Cardiologist: Dr. Lauree Chandler, MD  Subjective   No specific complaints today. Frustrated with his cell phone service. Reports no real improvement in symptoms today  Inpatient Medications    Scheduled Meds: . acetaminophen  650 mg Oral Q6H  . amiodarone  200 mg Oral Daily  . baclofen  5 mg Oral TID  . capsaicin   Topical BID  . chlorhexidine  15 mL Mouth/Throat BID  . dextromethorphan-guaiFENesin  1 tablet Oral BID  . feeding supplement  237 mL Oral BID BM  . ferrous sulfate  325 mg Oral Q breakfast  . folic acid  1 mg Oral Daily  . furosemide  40 mg Intravenous Daily  . loratadine  10 mg Oral Daily  . magnesium chloride  1 tablet Oral Daily  . metoprolol succinate  25 mg Oral Daily  . multivitamin with minerals  1 tablet Oral Daily  . pneumococcal 23 valent vaccine  0.5 mL Intramuscular Tomorrow-1000  . rivaroxaban  20 mg Oral Q supper  . sertraline  100 mg Oral Daily  . thiamine  100 mg Oral Daily   Continuous Infusions: . sodium chloride Stopped (08/16/20 0118)   PRN Meds: sodium chloride, benzocaine, calcium carbonate, camphor-menthol, diclofenac Sodium, hydroxypropyl methylcellulose / hypromellose, Muscle Rub   Vital Signs    Vitals:   09/30/20 1600 09/30/20 2038 10/01/20 0358 10/01/20 0500  BP: 112/77 108/78 114/82   Pulse: 78 77 72   Resp: 16 17 17    Temp: 97.8 F (36.6 C) 98.1 F (36.7 C) 98 F (36.7 C)   TempSrc: Oral Oral Oral   SpO2: 97% 100% 100%   Weight:    69.2 kg  Height:        Intake/Output Summary (Last 24 hours) at 10/01/2020 0959 Last data filed at 10/01/2020 0928 Gross per 24 hour  Intake 1750 ml  Output --  Net 1750 ml   Filed Weights   09/29/20 0437 09/30/20 0607 10/01/20 0500  Weight: 71.6 kg 70.4 kg 69.2 kg    Physical Exam   General: Elderly, NAD Neck: Negative for carotid bruits. No JVD Lungs:Clear to ausculation bilaterally.  No wheezes, rales, or rhonchi. Breathing is unlabored. Cardiovascular: RRR with S1 S2. No murmurs Abdomen: Soft, non-tender, non-distended. No obvious abdominal masses. Extremities: No edema. Radial pulses 2+ bilaterally Neuro: Alert and oriented. No focal deficits. No facial asymmetry. MAE spontaneously. Psych: Responds to questions appropriately with normal affect.    Labs    Chemistry Recent Labs  Lab 09/30/20 0719  NA 138  K 4.3  CL 107  CO2 23  GLUCOSE 117*  BUN 16  CREATININE 0.81  CALCIUM 9.4  GFRNONAA >60  ANIONGAP 8     HematologyNo results for input(s): WBC, RBC, HGB, HCT, MCV, MCH, MCHC, RDW, PLT in the last 168 hours.  Cardiac EnzymesNo results for input(s): TROPONINI in the last 168 hours. No results for input(s): TROPIPOC in the last 168 hours.   BNP Recent Labs  Lab 09/29/20 1558  BNP 252.8*     DDimer No results for input(s): DDIMER in the last 168 hours.   Radiology    DG Ankle Complete Right  Result Date: 09/29/2020 CLINICAL DATA:  RIGHT ankle swelling. EXAM: RIGHT ANKLE - COMPLETE 3+ VIEW COMPARISON:  None FINDINGS: Signs of prior trauma to the RIGHT ankle. Healed fractures of the distal fibula/lateral malleolus and signs of fixation of  medial malleolus. Ankle mortise is preserved. Mild soft tissue swelling along the lateral malleolus without visible fracture. No signs of dislocation. Vascular calcifications in the soft tissues. Degenerative changes about the ankle related to posttraumatic changes. Question of subluxation and or hammertoe deformities about the toes, difficult to establish which of these toes may be involved IMPRESSION: 1. Mild soft tissue swelling along the lateral malleolus without visible fracture. 2. Degenerative changes about the ankle related to posttraumatic changes. 3. Question of subluxation and or hammertoe deformities about the toes, difficult to establish which of these toes may be involved. Correlate with any pain about the  forefoot with dedicated imaging as warranted. Electronically Signed   By: Zetta Bills M.D.   On: 09/29/2020 19:31   ECHOCARDIOGRAM COMPLETE  Result Date: 09/29/2020    ECHOCARDIOGRAM REPORT   Patient Name:   Anthony [name] Date of Exam: 09/29/2020 Medical Rec #:  [id number]    Height:       68.0 in Accession #:    [id number]   Weight:       157.8 lb Date of Birth:  August 29, 1955    BSA:          1.848 m Patient Age:    60 years     BP:           117/80 mmHg Patient Gender: M            HR:           87 bpm. Exam Location:  Inpatient Procedure: 2D Echo, 3D Echo, Cardiac Doppler, Color Doppler and Strain Analysis Indications:    Dyspnea  History:        Patient has prior history of Echocardiogram examinations, most                 recent 07/26/2020. Risk Factors:Hypertension.  Sonographer:    Luisa Hart RDCS Referring Phys: [place]  1. Tracking is not accurate on GLS measurements.  2. Left ventricular ejection fraction, by estimation, is 60 to 65%. The left ventricle has normal function. The left ventricle has no regional wall motion abnormalities. Left ventricular diastolic parameters are consistent with Grade II diastolic dysfunction (pseudonormalization). Elevated left ventricular end-diastolic pressure.  3. Right ventricular systolic function is normal. The right ventricular size is normal. There is normal pulmonary artery systolic pressure.  4. Left atrial size was severely dilated.  5. Right atrial size was mildly dilated.  6. The mitral valve is normal in structure. Trivial mitral valve regurgitation. No evidence of mitral stenosis.  7. The aortic valve is tricuspid. Aortic valve regurgitation is not visualized. No aortic stenosis is present.  8. The inferior vena cava is normal in size with greater than 50% respiratory variability, suggesting right atrial pressure of 3 mmHg. FINDINGS  Left Ventricle: Left ventricular ejection fraction, by estimation, is 60 to 65%. The left ventricle  has normal function. The left ventricle has no regional wall motion abnormalities. The left ventricular internal cavity size was normal in size. There is  no left ventricular hypertrophy. Left ventricular diastolic parameters are consistent with Grade II diastolic dysfunction (pseudonormalization). Elevated left ventricular end-diastolic pressure. Right Ventricle: The right ventricular size is normal. No increase in right ventricular wall thickness. Right ventricular systolic function is normal. There is normal pulmonary artery systolic pressure. The tricuspid regurgitant velocity is 2.69 m/s, and  with an assumed right atrial pressure of 3 mmHg, the estimated right ventricular systolic pressure is 94.5 mmHg. Left  Atrium: Left atrial size was severely dilated. Right Atrium: Right atrial size was mildly dilated. Pericardium: There is no evidence of pericardial effusion. Mitral Valve: The mitral valve is normal in structure. Trivial mitral valve regurgitation. No evidence of mitral valve stenosis. MV peak gradient, 7.1 mmHg. The mean mitral valve gradient is 2.0 mmHg. Tricuspid Valve: The tricuspid valve is normal in structure. Tricuspid valve regurgitation is trivial. No evidence of tricuspid stenosis. Aortic Valve: The aortic valve is tricuspid. Aortic valve regurgitation is not visualized. No aortic stenosis is present. Aortic valve mean gradient measures 3.0 mmHg. Aortic valve peak gradient measures 4.8 mmHg. Aortic valve area, by VTI measures 2.36 cm. Pulmonic Valve: The pulmonic valve was normal in structure. Pulmonic valve regurgitation is not visualized. No evidence of pulmonic stenosis. Aorta: The aortic root is normal in size and structure. Venous: The inferior vena cava is normal in size with greater than 50% respiratory variability, suggesting right atrial pressure of 3 mmHg. IAS/Shunts: No atrial level shunt detected by color flow Doppler.  LEFT VENTRICLE PLAX 2D LVIDd:         4.80 cm      Diastology  LVIDs:         2.50 cm      LV e' medial:    7.94 cm/s LV PW:         0.70 cm      LV E/e' medial:  13.5 LV IVS:        0.80 cm      LV e' lateral:   10.30 cm/s LVOT diam:     1.90 cm      LV E/e' lateral: 10.4 LV SV:         52 LV SV Index:   28           2D Longitudinal Strain LVOT Area:     2.84 cm     2D Strain GLS (A2C):   -10.1 %                             2D Strain GLS (A3C):   -16.1 %                             2D Strain GLS (A4C):   -15.7 % LV Volumes (MOD)            2D Strain GLS Avg:     -14.0 % LV vol d, MOD A4C: 122.0 ml LV vol s, MOD A4C: 28.6 ml LV SV MOD A4C:     122.0 ml                             3D Volume EF:                             3D EF:        32 %                             LV EDV:       134 ml                             LV ESV:       91  ml                             LV SV:        43 ml RIGHT VENTRICLE RV S prime:     11.70 cm/s TAPSE (M-mode): 2.7 cm LEFT ATRIUM           Index       RIGHT ATRIUM           Index LA diam:      4.00 cm 2.16 cm/m  RA Area:     26.60 cm LA Vol (A2C): 84.8 ml 45.88 ml/m RA Volume:   83.80 ml  45.34 ml/m  AORTIC VALVE                   PULMONIC VALVE AV Area (Vmax):    2.11 cm    PV Vmax:       0.64 m/s AV Area (Vmean):   2.21 cm    PV Vmean:      50.200 cm/s AV Area (VTI):     2.36 cm    PV VTI:        0.133 m AV Vmax:           110.00 cm/s PV Peak grad:  1.6 mmHg AV Vmean:          77.400 cm/s PV Mean grad:  1.0 mmHg AV VTI:            0.220 m AV Peak Grad:      4.8 mmHg AV Mean Grad:      3.0 mmHg LVOT Vmax:         81.80 cm/s LVOT Vmean:        60.400 cm/s LVOT VTI:          0.183 m LVOT/AV VTI ratio: 0.83  AORTA Ao Root diam: 3.00 cm Ao Asc diam:  3.30 cm MITRAL VALVE                 TRICUSPID VALVE MV Area (PHT): 4.57 cm      TR Peak grad:   28.9 mmHg MV Area VTI:   2.15 cm      TR Vmax:        269.00 cm/s MV Peak grad:  7.1 mmHg MV Mean grad:  2.0 mmHg      SHUNTS MV Vmax:       1.33 m/s      Systemic VTI:  0.18 m MV Vmean:      65.0  cm/s     Systemic Diam: 1.90 cm MV Decel Time: 166 msec MR Peak grad:    70.2 mmHg MR Mean grad:    48.0 mmHg MR Vmax:         419.00 cm/s MR Vmean:        331.0 cm/s MR PISA:         0.57 cm MR PISA Eff ROA: 3 mm MR PISA Radius:  0.30 cm MV E velocity: 107.00 cm/s MV A velocity: 38.10 cm/s MV E/A ratio:  2.81 Skeet Latch MD Electronically signed by Skeet Latch MD Signature Date/Time: 09/29/2020/1:46:06 PM    Final    Telemetry    No currently on telemetry- Personally Reviewed  ECG    No new tracing as of 10/01/20- Personally Reviewed  Cardiac Studies   Echo 09/29/20:   1. Tracking is not accurate on GLS measurements.   2. Left ventricular  ejection fraction, by estimation, is 60 to 65%. The  left ventricle has normal function. The left ventricle has no regional  wall motion abnormalities. Left ventricular diastolic parameters are  consistent with Grade II diastolic  dysfunction (pseudonormalization). Elevated left ventricular end-diastolic  pressure.   3. Right ventricular systolic function is normal. The right ventricular  size is normal. There is normal pulmonary artery systolic pressure.   4. Left atrial size was severely dilated.   5. Right atrial size was mildly dilated.   6. The mitral valve is normal in structure. Trivial mitral valve  regurgitation. No evidence of mitral stenosis.   7. The aortic valve is tricuspid. Aortic valve regurgitation is not  visualized. No aortic stenosis is present.   8. The inferior vena cava is normal in size with greater than 50%  respiratory variability, suggesting right atrial pressure of 3 mmHg.      Echo: 07/26/20    1. Left ventricular ejection fraction, by estimation, is 55 to 60%. The  left ventricle has normal function. The left ventricle has no regional  wall motion abnormalities. Left ventricular diastolic parameters are  consistent with Grade II diastolic  dysfunction (pseudonormalization).   2. Right ventricular systolic  function is normal. The right ventricular  size is normal. Tricuspid regurgitation signal is inadequate for assessing  PA pressure.   3. The mitral valve is normal in structure. Trivial mitral valve  regurgitation. No evidence of mitral stenosis.   4. The aortic valve is tricuspid. Aortic valve regurgitation is not  visualized. No aortic stenosis is present.   5. Aortic dilatation noted. There is mild dilatation of the aortic root,  measuring 39 mm.   6. The inferior vena cava is dilated in size with >50% respiratory  variability, suggesting right atrial pressure of 8 mmHg.    Patient Profile     65 y.o. male with a hx of atrial fibrillation, HTN, EtOH abuse, diastolic heart failure, chronic back pain, depression and unprovoked PE who is being seen today for the evaluation of SOB/DOE at the request of Dr. Volanda Napoleon.   Assessment & Plan    1. DOE: -Most likely multifactorial given profound deconditioning however BNP marginally elevated at 254 with G2DD on echocardiogram therefore there is some degree of CHFCreatinine is stable at 0.88.  -Low suspicion for PE given Xarelto -Not likely related to Amio given short duration of use and stable imaging  -Transition to PO dosing today  -I&O, positive 11L- would get strict I&Os -Weight, 152lb>>admission weight appears to be 138lb -Follow BMET  -Given no real improvement in symptoms>>plan for CCTA tomorrow.  -One time metoprolol dose will be available if needed    2. Atrial fibrillation with RVR:  -Maintaining NSR per ausculation -Continue current regimen with Amiodarone 200mg  QD along with Toprol XL 25mg  daily and Xarelto 20mg   -CHA2DS2-VASc Score of 2.  -Planning to discharge to controlled environment with SNF.     3. Hx of PE:  -Unprovoked per chart review from 2018 -Continue Xarelto   4. Social issues:  -Waiting on placed at SNF however complicated due to psych issues -Management per primary team     Signed, Kathyrn Drown  NP-C [facility] Pager: [phone] 10/01/2020, 9:59 AM    Personally seen and examined. Agree with APP above with the following comments: Briefly 65 yo M with PAF on amiodarone, who has had persistent DOE.    Patient notes DOE with taking a shower.  Doesn't believe that this has improved with  IV lasix, though notes increase urination. Exam notable for crackles in lung bases; difficult exam (patient has very concerned about his cell phone service and was not able to breath or stop talking during much of his assessment) Labs notable for stable creatinine W0ould recommend CCTA.  This will show use pleural effusion and need for diuretics as well as rule out coronary cause of DOE.  Deconditioning on differential. Would benefit from I/Os but this may be difficult while also respecting patients wishes. Discussed at length with patient and reviewed differential with patient  Rudean Haskell, [place], [address] [place], [place] [id number] [phone]  11:09 AM   For questions or updates, please contact   Please consult [email or web address] for contact info under Cardiology/STEMI.

## 2020-10-02 ENCOUNTER — Inpatient Hospital Stay (HOSPITAL_COMMUNITY): Payer: Medicare Other

## 2020-10-02 ENCOUNTER — Encounter (HOSPITAL_COMMUNITY): Payer: Self-pay | Admitting: Family Medicine

## 2020-10-02 DIAGNOSIS — I2510 Atherosclerotic heart disease of native coronary artery without angina pectoris: Secondary | ICD-10-CM

## 2020-10-02 DIAGNOSIS — I4891 Unspecified atrial fibrillation: Secondary | ICD-10-CM | POA: Diagnosis not present

## 2020-10-02 LAB — QUANTIFERON-TB GOLD PLUS (RQFGPL)
QuantiFERON Mitogen Value: 5.11 IU/mL
QuantiFERON Nil Value: 0.07 IU/mL
QuantiFERON TB1 Ag Value: 0.07 IU/mL
QuantiFERON TB2 Ag Value: 0.08 IU/mL

## 2020-10-02 LAB — CBC
HCT: 36.7 % — ABNORMAL LOW (ref 39.0–52.0)
Hemoglobin: 12.2 g/dL — ABNORMAL LOW (ref 13.0–17.0)
MCH: 32.6 pg (ref 26.0–34.0)
MCHC: 33.2 g/dL (ref 30.0–36.0)
MCV: 98.1 fL (ref 80.0–100.0)
Platelets: 374 10*3/uL (ref 150–400)
RBC: 3.74 MIL/uL — ABNORMAL LOW (ref 4.22–5.81)
RDW: 13.5 % (ref 11.5–15.5)
WBC: 6.7 10*3/uL (ref 4.0–10.5)
nRBC: 0 % (ref 0.0–0.2)

## 2020-10-02 LAB — QUANTIFERON-TB GOLD PLUS: QuantiFERON-TB Gold Plus: NEGATIVE

## 2020-10-02 MED ORDER — NITROGLYCERIN 0.4 MG SL SUBL
SUBLINGUAL_TABLET | SUBLINGUAL | Status: AC
  Administered 2020-10-02: 0.8 mg via SUBLINGUAL
  Filled 2020-10-02: qty 2

## 2020-10-02 MED ORDER — NITROGLYCERIN 0.4 MG SL SUBL: 0.8000 mg | SUBLINGUAL_TABLET | Freq: Once | SUBLINGUAL | Status: AC

## 2020-10-02 MED ORDER — IVABRADINE HCL 5 MG PO TABS
10.0000 mg | ORAL_TABLET | Freq: Once | ORAL | Status: AC
  Administered 2020-10-02: 10 mg via ORAL
  Filled 2020-10-02 (×2): qty 2

## 2020-10-02 MED ORDER — IOHEXOL 350 MG/ML SOLN
80.0000 mL | Freq: Once | INTRAVENOUS | Status: AC | PRN
  Administered 2020-10-02: 80 mL via INTRAVENOUS
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
## 2020-10-02 NOTE — Progress Notes (Addendum)
FPTS Interim Progress Note  Received page from RN that patient had dark tarry stool today.Paitent denies abdominal pain or pain with bowel movements. Denies current diarrhea or constipation though he says he does have a history of diarrhea. Discussed with patient that the dark stool is likely from his oral iron supplement, but will obtain FOBT and am CBC  Shary Key, DO 10/02/2020, 12:07 AM PGY-1, [place] Medicine Service pager [phone]

## 2020-10-02 NOTE — Progress Notes (Addendum)
Progress Note  Patient Name: Anthony [name] Date of Encounter: 10/02/2020  Primary Cardiologist: Lauree Chandler, MD   Subjective   Pending CT scan.  Noted new tarry stools over night.  Inpatient Medications    Scheduled Meds: . acetaminophen  650 mg Oral Q6H  . amiodarone  200 mg Oral Daily  . baclofen  5 mg Oral TID  . capsaicin   Topical BID  . chlorhexidine  15 mL Mouth/Throat BID  . dextromethorphan-guaiFENesin  1 tablet Oral BID  . feeding supplement  237 mL Oral BID BM  . ferrous sulfate  325 mg Oral Q breakfast  . folic acid  1 mg Oral Daily  . furosemide  20 mg Oral Daily  . ivabradine  10 mg Oral Once  . loratadine  10 mg Oral Daily  . magnesium chloride  1 tablet Oral Daily  . metoprolol succinate  25 mg Oral Daily  . metoprolol tartrate  50 mg Oral On Call  . multivitamin with minerals  1 tablet Oral Daily  . pneumococcal 23 valent vaccine  0.5 mL Intramuscular Tomorrow-1000  . rivaroxaban  20 mg Oral Q supper  . sertraline  100 mg Oral Daily  . thiamine  100 mg Oral Daily   Continuous Infusions: . sodium chloride Stopped (08/16/20 0118)   PRN Meds: sodium chloride, benzocaine, calcium carbonate, camphor-menthol, diclofenac Sodium, hydroxypropyl methylcellulose / hypromellose, Muscle Rub   Vital Signs    Vitals:   10/02/20 0500 10/02/20 0806 10/02/20 0835 10/02/20 1142  BP: 97/73 97/73 107/76 (!) 129/97  Pulse: 80  78 87  Resp: 17   17  Temp: 97.7 F (36.5 C)     TempSrc: Oral     SpO2: 98%  98% 96%  Weight:      Height:       No intake or output data in the 24 hours ending 10/02/20 1240 Filed Weights   09/29/20 0437 09/30/20 0607 10/01/20 0500  Weight: 71.6 kg 70.4 kg 69.2 kg    Telemetry    Patient has deferred telemetry - Personally Reviewed  ECG    No new - Personally Reviewed  Physical Exam   GEN: No acute distress.   Neck: No JVD Cardiac: RRR, no murmurs, rubs, or gallops.  Respiratory: Coarse breath sounds worse in the  bases with poor air movement (Patient talks through exam) GI: Soft, nontender, non-distended  MS: No edema; No deformity. Neuro:  Nonfocal  Psych: Normal affect   Labs    Chemistry Recent Labs  Lab 09/30/20 0719 10/01/20 1127  NA 138 137  K 4.3 3.7  CL 107 104  CO2 23 25  GLUCOSE 117* 91  BUN 16 25*  CREATININE 0.81 1.11  CALCIUM 9.4 9.8  GFRNONAA >60 >60  ANIONGAP 8 8     Hematology Recent Labs  Lab 10/02/20 0212  WBC 6.7  RBC 3.74*  HGB 12.2*  HCT 36.7*  MCV 98.1  MCH 32.6  MCHC 33.2  RDW 13.5  PLT 374    Cardiac EnzymesNo results for input(s): TROPONINI in the last 168 hours. No results for input(s): TROPIPOC in the last 168 hours.   BNP Recent Labs  Lab 09/29/20 1558  BNP 252.8*     DDimer No results for input(s): DDIMER in the last 168 hours.   Radiology    No results found.  Cardiac Studies    Echo from 09/29/20  1. Tracking is not accurate on GLS measurements.   2. Left  ventricular ejection fraction, by estimation, is 60 to 65%. The  left ventricle has normal function. The left ventricle has no regional  wall motion abnormalities. Left ventricular diastolic parameters are  consistent with Grade II diastolic  dysfunction (pseudonormalization). Elevated left ventricular end-diastolic  pressure.   3. Right ventricular systolic function is normal. The right ventricular  size is normal. There is normal pulmonary artery systolic pressure.   4. Left atrial size was severely dilated.   5. Right atrial size was mildly dilated.   6. The mitral valve is normal in structure. Trivial mitral valve  regurgitation. No evidence of mitral stenosis.   7. The aortic valve is tricuspid. Aortic valve regurgitation is not  visualized. No aortic stenosis is present.   8. The inferior vena cava is normal in size with greater than 50%  respiratory variability, suggesting right atrial pressure of 3 mmHg.   Patient Profile     65 y.o. male atrial fibrillation,  HTN, EtOH abuse, diastolic heart failure, chronic back pain, depression and unprovoked PE who is being seen today for the evaluation of SOB/DOE   Assessment & Plan       DOE: -Most likely multifactorial given profound deconditioning  -Low suspicion for PE given Xarelto -Not likely related to Amio given short duration of use and stable imaging  -has not improved with diuresis- CT scan pending today - CCTA ordered today   Atrial fibrillation with RVR:  -Maintaining NSR per ausculation -Continue current regimen with Amiodarone 200mg  QD along with Toprol XL 25mg  daily and Xarelto 20mg   -CHA2DS2-VASc Score of 2.  -Planning to discharge to controlled environment with SNF.      Hx of PE:  -Unprovoked per chart review from 2018 -Continue Xarelto    Social issues:  -Waiting on placed at SNF however complicated due to psych issues -Management per primary team    Time Spent Directly with Patient:   I have spent a total of 35 minutes with the patient reviewing  notes, imaging labs and examining the patient as well as establishing an assessment and plan that was discussed personally with the patient.  > 50% of time was spent in direct patient care, discussing his concerns about back pain, mouth pain, and DOE.  For questions or updates, please contact [email or web address] Please consult [email or web address] for contact info under Cardiology/STEMI.      Signed, Werner Lean, MD  10/02/2020, 12:40 PM

## 2020-10-03 ENCOUNTER — Inpatient Hospital Stay (HOSPITAL_COMMUNITY): Payer: Medicare Other

## 2020-10-03 DIAGNOSIS — R931 Abnormal findings on diagnostic imaging of heart and coronary circulation: Secondary | ICD-10-CM

## 2020-10-03 MED ORDER — MOMETASONE FURO-FORMOTEROL FUM 100-5 MCG/ACT IN AERO
2.0000 | INHALATION_SPRAY | Freq: Two times a day (BID) | RESPIRATORY_TRACT | Status: DC
  Administered 2020-10-04 – 2020-10-08 (×9): 2 via RESPIRATORY_TRACT
  Filled 2020-10-03: qty 8.8

## 2020-10-03 NOTE — Progress Notes (Signed)
Family Medicine Teaching Service Daily Progress Note Intern Pager: [phone]  Patient name: Anthony [name] Medical record number: [id number] Date of birth: 1956/06/15 Age: 65 y.o. Gender: male  Primary Care Provider: Carollee Leitz, MD Consultants: Cardiology Code Status: Full  Pt Overview and Major Events to Date:  1/25 admitted 1/29 medically stable for discharge to supervised residential setting 3/14 Psych - pt has capacity  Assessment and Plan: Anthony [name] a 65 year old male who presented to the hospital with urosepsis and A. fib with RVR due to medication non-adherence;these have been treated and resolved.PMH is significant forA Fib, alcohol use disorder, HFpEF, HTN, chronic back pain, anxiety, depression, right PE.Patient is medically stable for discharge to supervised residential setting.   Difficult Placement -CSW following to help with placement  Dyspnea on exertion  HF (Grade 2 diastolic dysfunction) Stable. Unclear etiology.On Lasix 20mg  daily. CT scan on 3/16 noted ground glass opacity which has increased in density suggesting infection or inflammation. As patient has not had leukocytosis of fever and was treated for 5 days with abx, no suggestion to treat with more antibiotics at this time--scan may be lagging behind his clinical picture. Pulmonology reported that if patient wishes to start inhaler that would okay to do so and we will initiate dulera now. - Cardiology consulted, appreciate recs - Pulmonology consulted, appreciate recs - Repeat CT chest in 6-8 weeks - Pt will follow-up outpatient with pulm for PFTs - Dulera 100-14mcg 2 puffs BID - Continue IV Lasix  - Continue to monitor - Cardiology consulted, appreciate recs  Dark stools No new reports of dark stools to the primary team, FOBT has still not been completed. - F/u FOBT  Iron deficiency anemiaStable. -Continue FeSO4 daily -Recheck CBC 04/28  Chronic Back Pain: Stable. -Continue Tylenol 1gm  TID -Contniue Capsaicin prn -Continue Voltaren gel as needed -Continue Baclofen 5 mg TID  Xerosis: Stable -Continue moisturizing lotion BID -Atarax 5 mg prn  Recurrent Hypomagnesemia:Stable -Continue Mag Chloride daily -BMP qweekly on Mondays  Paroxysmal A-Fib, RVR resolved: Stable. Rate controlled -Continue Metoprolol 25 mg daily -Continue Amiodarone 200 mg daily, if continues to have SOB consider discontinuing -Continue Xarelto 20 mg daily  Alcohol Use Disorder:Stable. -Continue Thiamine, Folic Acid and Multivitamins daily  MDD  Anxiety:Stable -Continue Sertraline 200 mg daily  Malnutrition Stable -Continue to encourage po supplementation with Ensure   Hospital acquired pneumonia, resolved Stable.VSS, patient remains afebrile. Completed CTX(03/08-12). -Repeat chest xray 3-4 weeks(around beginning of April)   Right ankle tenderness and swelling, resolved   FEN/GI: Regular diet PPx: Xarelto  Status is: Inpatient  Dispo:             Patient From:  Home             Planned Disposition:  ALF/SNF             Medically stable for discharge:  Yes, difficult to place   Subjective:  Patient still reports many grievances, though none of them are new ones. Patient is agreeable to trying out the inhalers mentioned by pulmonology.    Objective: Temp:  [97.8 F (36.6 C)-97.9 F (36.6 C)] 97.9 F (36.6 C) (03/17 0429) Pulse Rate:  [71-83] 83 (03/17 0429) Resp:  [17-18] 18 (03/17 0429) BP: (106-118)/(77-79) 118/77 (03/17 0429) SpO2:  [97 %-100 %] 100 % (03/17 0429) Physical Exam: General: NAD, sitting up in bed, well-appearing Respiratory: comfortable on room air, no increased WOB, equal chest rise Extremities:moving all extremities appropriately, up and walking in the room during  examination.    Laboratory: Recent Labs  Lab 10/02/20 0212  WBC 6.7  HGB 12.2*  HCT 36.7*  PLT 374   Recent Labs  Lab 09/30/20 0719 10/01/20 1127  NA 138 137   K 4.3 3.7  CL 107 104  CO2 23 25  BUN 16 25*  CREATININE 0.81 1.11  CALCIUM 9.4 9.8  GLUCOSE 117* 91      Imaging/Diagnostic Tests: CT CORONARY MORPH W/CTA COR W/SCORE W/CA W/CM &/OR WO/CM  Addendum Date: 10/02/2020   ADDENDUM REPORT: 10/02/2020 17:59 CLINICAL DATA:  65 Year-old White Male EXAM: Cardiac/Coronary  CTA TECHNIQUE: The patient was scanned on a [employer]. FINDINGS: A 100 kV prospective scan was triggered in the descending thoracic aorta at 111 HU's. Axial non-contrast 3 mm slices were carried out through the heart. The data set was analyzed on a dedicated work station and scored using the [place]. Gantry rotation speed was 250 msecs and collimation was .6 mm. No beta blockade and 0.8 mg of sl NTG was given. The 3D data set was reconstructed in 5% intervals of the 67-82 % of the R-R cycle. Diastolic phases were analyzed on a dedicated work station using MPR, MIP and VRT modes. The patient received 80 cc of contrast. Aorta:  Normal size.  No calcifications.  No dissection. Aortic Valve:  Tri-leaflet.  Mild annular calcification. Coronary Arteries:  Normal coronary origin.  Right dominance. Coronary calcium score of 884. This was 91st percentile for age, sex, and race matched control. RCA is a large dominant artery that gives rise to PDA and PLA. There is no plaque. Left main is a large artery that gives rise to LAD and LCX arteries. There is a minimal calcification from the annulus that abuts the ostium of the left main. LAD is a large vessel that gives rise to one large D1 Branch. There is a minimal non-obstructive (1-24%) calcified plaque in the diagonal vessel. There are tandem moderate stenoses (50-70%) calcified plaques in the mid vessel. There are tandem calcified plaques in the distal vessel (mild non-obstructive 25-49% and moderate stenosis 50-70%). LCX is a non-dominant artery that gives rise to one large OM1 branch that bifurcates. There is a minimal  non-obstructive (1-24%) soft plaque in the proximal vessel. 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 Normal left atrial appendage without a thrombus. Normal size of the pulmonary artery. Extra-cardiac findings: See attached radiology report for non-cardiac structures. IMPRESSION: 1. Coronary calcium score of 884. This was 91st percentile for age, sex, and race matched control. 2. Normal coronary origin.  Right dominance. 3. CAD-RADS 3. Moderate stenosis. Consider symptom-guided anti-ischemic pharmacotherapy as well as risk factor modification per guideline directed care. Additional analysis with CT FFR will be submitted. 4. 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 Electronically Signed   By: Rudean Haskell MD   On: 10/02/2020 17:59   Result Date: 10/02/2020 EXAM: OVER-READ INTERPRETATION  CT CHEST The following report is an over-read performed by radiologist Dr. Vinnie Langton of [facility], [place] on 10/02/2020. This over-read does not include interpretation of cardiac or coronary anatomy or pathology. The coronary calcium score/coronary CTA interpretation by the cardiologist is attached. COMPARISON:  None. FINDINGS: Aortic atherosclerosis. Patchy areas of ground-glass attenuation are noted in the lungs, most evident in the left upper lobe within the visualized portions of the thorax there are no suspicious appearing pulmonary  nodules or masses, there is no confluent consolidative airspace disease, no pleural effusions, no pneumothorax and no lymphadenopathy. Visualized portions of the upper abdomen demonstrate postoperative changes at the gastroesophageal junction. There are no aggressive appearing lytic or blastic lesions noted in the visualized portions of the skeleton. IMPRESSION: 1. Patchy ground-glass attenuation in the lungs, most evident in the left upper lobe.  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 2.  Aortic Atherosclerosis (ICD10-I70.0). Electronically Signed: By: Vinnie Langton M.D. On: 10/02/2020 16:22     Rise Patience, DO 10/03/2020, 12:02 PM PGY-1, [place] Intern pager: [phone], text pages welcome

## 2020-10-03 NOTE — Progress Notes (Signed)
Family Medicine Teaching Service Daily Progress Note Intern Pager: [phone]  Patient name: Anthony [name] Medical record number: [id number] Date of birth: 12-27-1955 Age: 65 y.o. Gender: male  Primary Care Provider: Carollee Leitz, MD Consultants: Cardiology, pulmonology Code Status: Full  Pt Overview and Major Events to Date:  1/25 admitted 1/29 medically stable for discharge to supervised residential setting 3/14 Psych - pt has capacity  Assessment and Plan: Anthony [name] a 65 year old male who presented to the hospital with urosepsis and A. fib with RVR due to medication non-adherence;these have been treated and resolved.PMH is significant forA Fib, alcohol use disorder, HFpEF, HTN, chronic back pain, anxiety, depression, right PE.Patient is medically stable for discharge to supervised residential setting.   Difficult Placement -CSW following to help with placement  Dyspnea on exertion HF (Grade 2 diastolic dysfunction) Stable. Unclear etiology.On Lasix 20mg  daily. Patient is now on [facility]. Cardiology noted nonobstructive CAD with no area of FFR positive lesions on his imaging. Per cardiology, starting imdur 15mg  PO and getting a lipid panel to determine if statin needed.  - Cardiology consulted, appreciate recs - Pulmonology consulted, appreciate recs - Repeat CT chest in 6-8 weeks - Pt will follow-up outpatient with pulm for PFTs - Dulera 100-34mcg 2 puffs BID - Continue Lasix - Continue to monitor - Cardiology consulted, appreciate recs  Dark stools No new reports of dark stools to the primary team, FOBT has still not been completed. - F/u FOBT  Iron deficiency anemiaStable. -Continue FeSO4 daily -Recheck CBC 04/28  Chronic Back Pain: Stable. -Continue Tylenol 1gm TID -Contniue Capsaicin prn -Continue Voltaren gel as needed -Continue Baclofen 5 mg TID  Xerosis: Stable -Continue moisturizing lotion BID -Atarax 5 mg prn  Recurrent  Hypomagnesemia:Stable -Continue Mag Chloride daily -BMP qweekly on Mondays  Paroxysmal A-Fib, RVR resolved: Stable. Rate controlled -Continue Metoprolol 25 mg daily -Continue Amiodarone 200 mg daily, if continues to have SOB consider discontinuing -Continue Xarelto 20 mg daily  Alcohol Use Disorder:Stable. -Continue Thiamine, Folic Acid and Multivitamins daily  MDD  Anxiety:Stable -Continue Sertraline 200 mg daily  Malnutrition Stable -Continue to encourage po supplementation with Ensure   Hospital acquired pneumonia, resolved Stable.VSS, patient remains afebrile. Completed CTX(03/08-12). -Repeat chest xray 3-4 weeks(around beginning of April)  FEN/GI: Regular diet PPx: Xarelto   Status is: Inpatient  Status is: Inpatient  Dispo: Patient From: Home Planned Disposition: ALF/SNF Medically stable for discharge: Yes, difficult to place     Subjective:  Unable to see patient this morning, no calls from nursing overnight. Please see attending attestation.   Objective: Temp:  [98 F (36.7 C)-98.1 F (36.7 C)] 98 F (36.7 C) (03/18 0403) Pulse Rate:  [78-85] 78 (03/18 0756) Resp:  [16-18] 16 (03/18 0756) BP: (115-116)/(82-83) 116/83 (03/18 0403) SpO2:  [96 %-99 %] 96 % (03/18 0756) Physical Exam: General: appeared well from the doorway in passing. Was not able to see him this morning, please see attending attestation   Laboratory: Recent Labs  Lab 10/02/20 0212  WBC 6.7  HGB 12.2*  HCT 36.7*  PLT 374   Recent Labs  Lab 09/30/20 0719 10/01/20 1127  NA 138 137  K 4.3 3.7  CL 107 104  CO2 23 25  BUN 16 25*  CREATININE 0.81 1.11  CALCIUM 9.4 9.8  GLUCOSE 117* 91     Imaging/Diagnostic Tests: CT CORONARY FRACTIONAL FLOW RESERVE DATA PREP  Result Date: 10/03/2020 EXAM: CT-FFR ANALYSIS CLINICAL DATA:  LAD calcification FINDINGS: CT-FFR analysis was performed on the original  cardiac CT angiogram  dataset. Diagrammatic representation of the CT-FFR analysis is provided in a separate PDF document in PACS. This dictation was created using the PDF document and an interactive 3D model of the results. 3D model is not available in the EMR/PACS. Normal FFR range is >0.80. 1. Left Main: No significant functional stenosis, CT-FFR 0.97. 2. LAD: No significant functional stenosis, CT-FFR 0.86 distal vessel; CT-FFr 0.83 distal diagonal vessel. 3. LCX: No significant functional stenosis, CT-FFR 0.89 distal vessel. 4. RCA: No significant functional stenosis, CT-FFR 0.90. IMPRESSION: 1. CT FFR analysis shows no evidence of significant functional stenosis. Recommend aggressive risk factor management for non-obstructive calcifications. Rudean Haskell MD Electronically Signed   By: Rudean Haskell MD   On: 10/03/2020 14:32     Rise Patience, DO 10/04/2020, 1:56 PM PGY-1, [place] Intern pager: [phone], text pages welcome

## 2020-10-03 NOTE — TOC Progression Note (Signed)
Transition of Care [facility]) - Progression Note    Patient Details  Name: Anthony [name] MRN: [id number] Date of Birth: 01-05-1956  Transition of Care [facility] CM/SW [place], [place] Phone Number: 10/03/2020, 2:30 PM  Clinical Narrative:     No updates from ALF. CSW provided updates to pts brother [name] and niece Meagan per pts request.   CSW will continue to follow.   Expected Discharge Plan: [place] Barriers to Discharge: Unsafe home situation,Family Issues,Financial Resources,Other (comment),Homeless with medical needs (No ALF bed)  Expected Discharge Plan and Services Expected Discharge Plan: [place] arrangements for the past 2 months: Hotel/Motel                                       Social Determinants of Health (SDOH) Interventions    Readmission Risk Interventions No flowsheet data found.  Emeterio Reeve, Latanya Presser, [place] Social Worker [phone]

## 2020-10-04 DIAGNOSIS — Z659 Problem related to unspecified psychosocial circumstances: Secondary | ICD-10-CM | POA: Diagnosis not present

## 2020-10-04 DIAGNOSIS — I4891 Unspecified atrial fibrillation: Secondary | ICD-10-CM | POA: Diagnosis not present

## 2020-10-04 DIAGNOSIS — F1020 Alcohol dependence, uncomplicated: Secondary | ICD-10-CM | POA: Diagnosis not present

## 2020-10-04 MED ORDER — ISOSORBIDE MONONITRATE ER 30 MG PO TB24
15.0000 mg | ORAL_TABLET | Freq: Every day | ORAL | Status: DC
  Administered 2020-10-04 – 2020-10-05 (×2): 15 mg via ORAL
  Filled 2020-10-04: qty 1

## 2020-10-04 NOTE — Progress Notes (Signed)
Progress Note  Patient Name: Domenico A Covino Date of Encounter: 10/04/2020  Primary Cardiologist: Dr. Lauree Chandler, MD  Subjective   Notes some improvement with the inhaler.  No DOE last 24 hours.  Eager to review images. Today.  Inpatient Medications    Scheduled Meds: . acetaminophen  650 mg Oral Q6H  . amiodarone  200 mg Oral Daily  . baclofen  5 mg Oral TID  . capsaicin   Topical BID  . chlorhexidine  15 mL Mouth/Throat BID  . dextromethorphan-guaiFENesin  1 tablet Oral BID  . feeding supplement  237 mL Oral BID BM  . ferrous sulfate  325 mg Oral Q breakfast  . folic acid  1 mg Oral Daily  . furosemide  20 mg Oral Daily  . loratadine  10 mg Oral Daily  . magnesium chloride  1 tablet Oral Daily  . metoprolol succinate  25 mg Oral Daily  . mometasone-formoterol  2 puff Inhalation BID  . multivitamin with minerals  1 tablet Oral Daily  . pneumococcal 23 valent vaccine  0.5 mL Intramuscular Tomorrow-1000  . rivaroxaban  20 mg Oral Q supper  . sertraline  100 mg Oral Daily  . thiamine  100 mg Oral Daily   Continuous Infusions: . sodium chloride Stopped (08/16/20 0118)   PRN Meds: sodium chloride, benzocaine, calcium carbonate, camphor-menthol, diclofenac Sodium, hydroxypropyl methylcellulose / hypromellose, Muscle Rub   Vital Signs    Vitals:   10/03/20 1348 10/03/20 1953 10/04/20 0403 10/04/20 0756  BP: 117/84 115/82 116/83   Pulse:  85 84 78  Resp: 18 18 18 16   Temp: 98 F (36.7 C) 98.1 F (36.7 C) 98 F (36.7 C)   TempSrc: Oral Oral Oral   SpO2: 98% 98% 99% 96%  Weight:      Height:        Intake/Output Summary (Last 24 hours) at 10/04/2020 1339 Last data filed at 10/04/2020 1000 Gross per 24 hour  Intake 560 ml  Output --  Net 560 ml   Filed Weights   09/29/20 0437 09/30/20 0607 10/01/20 0500  Weight: 71.6 kg 70.4 kg 69.2 kg    Physical Exam   General: Elderly, NAD Neck: Negative for carotid bruits. No JVD Lungs:Clear to  ausculation bilaterally. No wheezes, rales, or rhonchi. Breathing is unlabored. Cardiovascular: RRR with S1 S2. No murmurs Abdomen: Soft, non-tender, non-distended. No obvious abdominal masses.  Extremities: No edema. Neuro: Alert and oriented. No focal deficits. No facial asymmetry. MAE spontaneously. Psych: Responds to questions appropriately with normal affect.    Labs    Chemistry Recent Labs  Lab 09/30/20 0719 10/01/20 1127  NA 138 137  K 4.3 3.7  CL 107 104  CO2 23 25  GLUCOSE 117* 91  BUN 16 25*  CREATININE 0.81 1.11  CALCIUM 9.4 9.8  GFRNONAA >60 >60  ANIONGAP 8 8     Hematology Recent Labs  Lab 10/02/20 0212  WBC 6.7  RBC 3.74*  HGB 12.2*  HCT 36.7*  MCV 98.1  MCH 32.6  MCHC 33.2  RDW 13.5  PLT 374    Cardiac EnzymesNo results for input(s): TROPONINI in the last 168 hours. No results for input(s): TROPIPOC in the last 168 hours.   BNP Recent Labs  Lab 09/29/20 1558  BNP 252.8*     DDimer No results for input(s): DDIMER in the last 168 hours.   Radiology    CT CORONARY MORPH W/CTA COR W/SCORE W/CA W/CM &/OR WO/CM  Addendum Date: 10/02/2020   ADDENDUM REPORT: 10/02/2020 17:59 CLINICAL DATA:  65 Year-old White Male EXAM: Cardiac/Coronary  CTA TECHNIQUE: The patient was scanned on a [employer]. FINDINGS: A 100 kV prospective scan was triggered in the descending thoracic aorta at 111 HU's. Axial non-contrast 3 mm slices were carried out through the heart. The data set was analyzed on a dedicated work station and scored using the [place]. Gantry rotation speed was 250 msecs and collimation was .6 mm. No beta blockade and 0.8 mg of sl NTG was given. The 3D data set was reconstructed in 5% intervals of the 67-82 % of the R-R cycle. Diastolic phases were analyzed on a dedicated work station using MPR, MIP and VRT modes. The patient received 80 cc of contrast. Aorta:  Normal size.  No calcifications.  No dissection. Aortic Valve:  Tri-leaflet.   Mild annular calcification. Coronary Arteries:  Normal coronary origin.  Right dominance. Coronary calcium score of 884. This was 91st percentile for age, sex, and race matched control. RCA is a large dominant artery that gives rise to PDA and PLA. There is no plaque. Left main is a large artery that gives rise to LAD and LCX arteries. There is a minimal calcification from the annulus that abuts the ostium of the left main. LAD is a large vessel that gives rise to one large D1 Branch. There is a minimal non-obstructive (1-24%) calcified plaque in the diagonal vessel. There are tandem moderate stenoses (50-70%) calcified plaques in the mid vessel. There are tandem calcified plaques in the distal vessel (mild non-obstructive 25-49% and moderate stenosis 50-70%). LCX is a non-dominant artery that gives rise to one large OM1 branch that bifurcates. There is a minimal non-obstructive (1-24%) soft plaque in the proximal vessel. 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 Normal left atrial appendage without a thrombus. Normal size of the pulmonary artery. Extra-cardiac findings: See attached radiology report for non-cardiac structures. IMPRESSION: 1. Coronary calcium score of 884. This was 91st percentile for age, sex, and race matched control. 2. Normal coronary origin.  Right dominance. 3. CAD-RADS 3. Moderate stenosis. Consider symptom-guided anti-ischemic pharmacotherapy as well as risk factor modification per guideline directed care. Additional analysis with CT FFR will be submitted. 4. 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 Electronically Signed   By: Rudean Haskell MD   On: 10/02/2020 17:59   Result Date: 10/02/2020 EXAM: OVER-READ INTERPRETATION  CT CHEST The following report is an over-read performed by radiologist Dr. Vinnie Langton of [facility], [place] on 10/02/2020.  This over-read does not include interpretation of cardiac or coronary anatomy or pathology. The coronary calcium score/coronary CTA interpretation by the cardiologist is attached. COMPARISON:  None. FINDINGS: Aortic atherosclerosis. Patchy areas of ground-glass attenuation are noted in the lungs, most evident in the left upper lobe within the visualized portions of the thorax there are no suspicious appearing pulmonary nodules or masses, there is no confluent consolidative airspace disease, no pleural effusions, no pneumothorax and no lymphadenopathy. Visualized portions of the upper abdomen demonstrate postoperative changes at the gastroesophageal junction. There are no aggressive appearing lytic or blastic lesions noted in the visualized portions of the skeleton. IMPRESSION: 1. Patchy ground-glass attenuation in the lungs, most evident in the left upper lobe. 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 2.  Aortic Atherosclerosis (ICD10-I70.0). Electronically Signed: By: Vinnie Langton M.D. On: 10/02/2020 16:22   CT CORONARY FRACTIONAL FLOW RESERVE DATA PREP  Result Date: 10/03/2020 EXAM: CT-FFR ANALYSIS CLINICAL DATA:  LAD calcification FINDINGS: CT-FFR analysis was performed on the original cardiac CT angiogram dataset. Diagrammatic representation of the CT-FFR analysis is provided in a separate PDF document in PACS. This dictation was created using the PDF document and an interactive 3D model of the results. 3D model is not available in the EMR/PACS. Normal FFR range is >0.80. 1. Left Main: No significant functional stenosis, CT-FFR 0.97. 2. LAD: No significant functional stenosis, CT-FFR 0.86 distal vessel; CT-FFr 0.83 distal diagonal vessel. 3. LCX: No significant functional stenosis, CT-FFR 0.89 distal vessel. 4. RCA: No significant functional stenosis, CT-FFR 0.90. IMPRESSION: 1. CT FFR analysis shows no evidence  of significant functional stenosis. Recommend aggressive risk factor management for non-obstructive calcifications. Rudean Haskell MD Electronically Signed   By: Rudean Haskell MD   On: 10/03/2020 14:32   Telemetry    No currently on telemetry- Personally Reviewed  ECG    No new tracing as of 10/01/20- Personally Reviewed  Cardiac Studies   Echo 09/29/20:   1. Tracking is not accurate on GLS measurements.   2. Left ventricular ejection fraction, by estimation, is 60 to 65%. The  left ventricle has normal function. The left ventricle has no regional  wall motion abnormalities. Left ventricular diastolic parameters are  consistent with Grade II diastolic  dysfunction (pseudonormalization). Elevated left ventricular end-diastolic  pressure.   3. Right ventricular systolic function is normal. The right ventricular  size is normal. There is normal pulmonary artery systolic pressure.   4. Left atrial size was severely dilated.   5. Right atrial size was mildly dilated.   6. The mitral valve is normal in structure. Trivial mitral valve  regurgitation. No evidence of mitral stenosis.   7. The aortic valve is tricuspid. Aortic valve regurgitation is not  visualized. No aortic stenosis is present.   8. The inferior vena cava is normal in size with greater than 50%  respiratory variability, suggesting right atrial pressure of 3 mmHg.      Echo: 07/26/20    1. Left ventricular ejection fraction, by estimation, is 55 to 60%. The  left ventricle has normal function. The left ventricle has no regional  wall motion abnormalities. Left ventricular diastolic parameters are  consistent with Grade II diastolic  dysfunction (pseudonormalization).   2. Right ventricular systolic function is normal. The right ventricular  size is normal. Tricuspid regurgitation signal is inadequate for assessing  PA pressure.   3. The mitral valve is normal in structure. Trivial mitral valve   regurgitation. No evidence of mitral stenosis.   4. The aortic valve is tricuspid. Aortic valve regurgitation is not  visualized. No aortic stenosis is present.   5. Aortic dilatation noted. There is mild dilatation of the aortic root,  measuring 39 mm.   6. The inferior vena cava is dilated in size with >50% respiratory  variability, suggesting right atrial pressure of 8 mmHg.    Patient Profile     65 y.o. male with a hx of atrial fibrillation, HTN, EtOH abuse, diastolic heart failure, chronic back pain, depression and unprovoked PE who is being seen today for the evaluation of SOB/DOE at the request of Dr. Volanda Napoleon.   Assessment & Plan    Nonobstructive CAD -Review images with patient- no area of FFR positive lessions -Shared Decision Making:  Discussed trial of Imdur 15 mg PO daily; discuss the risks of headache and that this is for quality of life.  Discussed aggressive risk factor modification, patient would like to make medication changes one at a time, but is amenable to checking Lipids and setting a goal of LDL < 70 - starting Imdur 15 mg PO daily - seing Lipids for AM 10/05/20; if LDL greater than 70, would recommend starting moderate intensity statin (consider rosuvastatin 10 mg PO daily)  DOE: - COP may also be a contributor; PCCM has made recommendations    Atrial fibrillation with RVR:  -Maintaining NSR per ausculation -Continue current regimen with Amiodarone 200mg  QD along with Toprol XL 25mg  daily and Xarelto 20mg   -CHA2DS2-VASc Score of 2.     Hx of PE:  -Unprovoked per chart review from 2018 -Continue Xarelto (no ASA for CAD)   4. Social issues:  -Waiting on placed at SNF however complicated due to psych issues -Management per primary team    [facility] will sign off.   Medication Recommendations:  Imdur 15 mg Other recommendations (labs, testing, etc):  Lipids in AM and possible statin as above Follow up as an outpatient:  If patient planned for discharge  let us know peri-discharge and we will arrange follow up.  If not, can re-consult in 3 months for follow up.  Rudean Haskell, MD [address] [place], [place] [address] [phone]  1:49 PM   For questions or updates, please contact   Please consult [email or web address] for contact info under Cardiology/STEMI.

## 2020-10-04 NOTE — Progress Notes (Signed)
Family Medicine Teaching Service Daily Progress Note Intern Pager: [phone]  Patient name: Anthony [name] Medical record number: [id number] Date of birth: 1955/09/27 Age: 65 y.o. Gender: male  Primary Care Provider: Carollee Leitz, MD Consultants: Cardiology, pulmonology Code Status: Full  Pt Overview and Major Events to Date:  1/25 admitted 1/29 medically stable for discharge to supervised residential setting 3/14 Psych - pt has capacity  Assessment and Plan: Anthony [name] a 65 year old male who presented to the hospital with urosepsis and A. fib with RVR due to medication non-adherence;these have been treated and resolved.PMH is significant forA Fib, alcohol use disorder, HFpEF, HTN, chronic back pain, anxiety, depression, right PE.   Difficult Placement Complicating and prolonging hospital stay.  [facility] admissions coordinator to evaluate on 10/05/2020 for hopeful placement on 3/28 if accepted. Patient is medically stable for discharge to supervised residential setting. -CSW following to help with placement  Dyspnea on exertion HFpEF (Grade 2 DD): Chronic, improving. Likely multifactorial due to deconditioning, mild central bronchiectasis, and HFpEF.  Reassuringly low risk CT coronary study on 3/17. -Cardiology signed off 3/17, appreciate recommendations -Pulmonology consulted due to abnormal CT, no concern for COP, recommended starting [facility] and follow-up outpatient for PFTs -Repeat CT chest in 6-8 weeks -Continue Dulera twice daily -Continue Lasix 20 mg daily (will obtain BMP given recent start) -Up as tolerated  Hyperlipidemia: Chronic, stable. LDL 87 on am lipid panel goal per cardiology <70.   -Discuss starting rosuvastatin 10 mg when patient is more alert (later in day)   Dark stools: Resolved.  Noted on 3/15, no further report per patient.  Suspect likely due to new iron supplementation.  Hemoglobin stable, low concern for GI bleed. -Monitor CBC -f/u FOBT when  collected   Iron deficiency anemia: Chronic, Stable. Last Hgb 12.2 on 3/16, much improved from 08/2020.  -Continue FeSO4 daily -Recheck CBC in 2 weeks or sooner if clinical concern as above  Chronic Back Pain: Stable. -Continue Tylenol 1gm TID -Contniue Capsaicin prn -Continue Voltaren gel as needed -Continue Baclofen 5 mg TID  Xerosis: Stable -Continue moisturizing lotion BID -Atarax 5 mg prn  Recurrent Hypomagnesemia:Stable -Continue Mag Chloride daily -BMP + Mag level qweekly on Mondays  Paroxysmal A-Fib, RVR resolved: Stable, Rate controlled. -Continue Metoprolol 25 mg daily -Continue Amiodarone 200 mg daily, if continues to have SOB consider discontinuing -Continue Xarelto 20 mg daily  Alcohol Use Disorder: Stable. No use since admission in 07/2020.  -Continue Thiamine, Folic Acid and Multivitamins daily  MDD  Anxiety:Chronic, Stable. -Continue Sertraline 200 mg daily  Malnutrition: Chronic, Stable. -Continue to encourage po supplementation with Ensure   Hospital acquired pneumonia: Treated and resolved.  Asymptomatic.  Completed CTX 3/8-3/12. -Repeat CT in 6-8 weeks to reevaluate as discussed above   FEN/GI: Regular diet PPx: Xarelto   Status is: Inpatient  Status is: Inpatient  Dispo: Patient From: Hotel  Planned Disposition: ALF Medically stable for discharge: Yes, difficult to place.   Subjective:  No acute events overnight.  Reports he had a mild headache that is since resolved.  Otherwise continues to endorse his breathing has felt better with the inhaler and Imdur.  Has no acute concerns.  Objective: Temp:  [97.9 F (36.6 C)-98.2 F (36.8 C)] 98.2 F (36.8 C) (03/18 1947) Pulse Rate:  [71-84] 71 (03/18 1947) Resp:  [16-18] 18 (03/18 1947) BP: (109-119)/(73-87) 119/87 (03/18 1947) SpO2:  [96 %-100 %] 99 % (03/18 2048) Physical Exam: General: NAD, resting comfortably  HEENT: NCAT,  MMM Lungs: No increased  WOB on RA Abdomen: soft Msk: Moves all extremities spontaneously   Laboratory: Recent Labs  Lab 10/02/20 0212  WBC 6.7  HGB 12.2*  HCT 36.7*  PLT 374   Recent Labs  Lab 09/30/20 0719 10/01/20 1127  NA 138 137  K 4.3 3.7  CL 107 104  CO2 23 25  BUN 16 25*  CREATININE 0.81 1.11  CALCIUM 9.4 9.8  GLUCOSE 117* 91     Imaging/Diagnostic Tests: No results found.   [name], DO 10/04/2020, 9:54 PM PGY-3, [place] Intern pager: [phone], text pages welcome

## 2020-10-04 NOTE — TOC Progression Note (Signed)
Transition of Care [facility]) - Progression Note    Patient Details  Name: Anthony [name] MRN: [id number] Date of Birth: [birth year]  Transition of Care [facility]) CM/SW [place], [place] Phone Number: 10/04/2020, 3:51 PM  Clinical Narrative:     CSW spoke to [facility] admissions coordinator. The bed in [place] is no longer available. There will be a bed in [facility] opening March 28th. Admissions coordinator plans to evaluate pt in person on Monday 10/05/20. After evaluation we will know more about if pt will be potentially accepted or not.    Expected Discharge Plan: [place] Barriers to Discharge: Unsafe home situation,Family Issues,Financial Resources,Other (comment),Homeless with medical needs (No ALF bed)  Expected Discharge Plan and Services Expected Discharge Plan: [place] arrangements for the past 2 months: Hotel/Motel                                       Social Determinants of Health (SDOH) Interventions    Readmission Risk Interventions No flowsheet data found.  Emeterio Reeve, Latanya Presser, [place] Social Worker [phone]

## 2020-10-05 DIAGNOSIS — R0600 Dyspnea, unspecified: Secondary | ICD-10-CM

## 2020-10-05 DIAGNOSIS — Z659 Problem related to unspecified psychosocial circumstances: Secondary | ICD-10-CM | POA: Diagnosis not present

## 2020-10-05 DIAGNOSIS — I4891 Unspecified atrial fibrillation: Secondary | ICD-10-CM | POA: Diagnosis not present

## 2020-10-05 LAB — BASIC METABOLIC PANEL
Anion gap: 7 (ref 5–15)
BUN: 16 mg/dL (ref 8–23)
CO2: 22 mmol/L (ref 22–32)
Calcium: 9.1 mg/dL (ref 8.9–10.3)
Chloride: 108 mmol/L (ref 98–111)
Creatinine, Ser: 0.73 mg/dL (ref 0.61–1.24)
GFR, Estimated: >60 mL/min (ref >60)
Glucose, Bld: 97 mg/dL (ref 70–99)
Potassium: 3.6 mmol/L (ref 3.5–5.1)
Sodium: 137 mmol/L (ref 135–145)

## 2020-10-05 LAB — LIPID PANEL
Cholesterol: 164 mg/dL (ref 0–200)
HDL: 52 mg/dL (ref >40)
LDL Cholesterol: 87 mg/dL (ref 0–99)
Total CHOL/HDL Ratio: 3.2 RATIO
Triglycerides: 125 mg/dL (ref <150)
VLDL: 25 mg/dL (ref 0–40)

## 2020-10-06 DIAGNOSIS — F1020 Alcohol dependence, uncomplicated: Secondary | ICD-10-CM | POA: Diagnosis not present

## 2020-10-06 DIAGNOSIS — I4891 Unspecified atrial fibrillation: Secondary | ICD-10-CM | POA: Diagnosis not present

## 2020-10-06 DIAGNOSIS — Z659 Problem related to unspecified psychosocial circumstances: Secondary | ICD-10-CM | POA: Diagnosis not present

## 2020-10-06 MED ORDER — ROSUVASTATIN CALCIUM 5 MG PO TABS
10.0000 mg | ORAL_TABLET | Freq: Every day | ORAL | Status: DC
  Administered 2020-10-06 – 2020-10-09 (×4): 10 mg via ORAL
  Filled 2020-10-06 (×4): qty 2

## 2020-10-06 NOTE — Progress Notes (Signed)
Family Medicine Teaching Service Daily Progress Note Intern Pager: [phone]  Patient name: Anthony [name] [place] Medical record number: [id number] Date of birth: [birth year] Age: 65 y.o. Gender: male  Primary Care Provider: Carollee Leitz, MD Consultants: Cardiology, Pulmonology  Code Status: Full  Pt Overview and Major Events to Date:  1/25 admitted 1/29 medically stable for discharge to supervised residential setting 3/14 Psych - pt has capacity 3/14 cardiology consulted 3/16 pulmonology consulted due to COP on CT 3/17 reassuring low risk CT coronary study, cardiology signed off.  Patient given Dulera  Assessment and Plan: Anthony [name] a 65 year old male who presented to the hospital with urosepsis and A. fib with RVR due to medication non-adherence;these have been treated and resolved.PMH is significant forA Fib, alcohol use disorder, HFpEF, HTN, chronic back pain, anxiety, depression, right PE.   Difficult Placement Complicating and prolonging hospital stay.  [facility] admissions coordinator to evaluate on 10/05/2020 for hopeful placement on 3/28 if accepted. Patient is medically stable for discharge to supervised residential setting. -CSW following to help with placement  Dyspnea on exertion HFpEF (Grade 2 DD): Chronic, improving. Likely multifactorial due to deconditioning, mild central bronchiectasis, and HFpEF.  Reassuringly low risk CT coronary study on 3/17.  BMP collected as patient is on Lasix, creatinine 0.73. -Cardiology signed off 3/17, appreciate recommendations -Pulmonology consulted (follow-up outpatient for PFTs) -Repeat CT chest in 6-8 weeks -Continue Dulera twice daily -Continue Lasix 20 mg daily  -Up as tolerated  Hyperlipidemia: Chronic, stable. LDL 87, goal per cardiology <70.  Patient expressed interest in statin during discussion, discussed common side effects. -Start rosuvastatin 10 mg   Iron deficiency anemia: Chronic, Stable. Last Hgb 12.2 on 3/16.    Patient reported dark stools previously, likely secondary to oral iron. -Continue FeSO4 daily -Recheck CBC 3/30 or sooner if clinical concern   Chronic Back Pain: Stable. -Continue Tylenol 1gm TID -Contniue Capsaicin prn -Continue Voltaren gel as needed -Continue Baclofen 5 mg TID  Xerosis: Stable -Continue moisturizing lotion BID -Atarax 5 mg prn  Recurrent Hypomagnesemia:Stable -Continue Mag Chloride daily -BMP + Mag level qweekly on Mondays  Paroxysmal A-Fib, RVR resolved: Stable, Rate controlled. -Continue Metoprolol 25 mg daily -Continue Amiodarone 200 mg daily -Continue Xarelto 20 mg daily  Alcohol Use Disorder: Stable. No use since admission in 07/2020.  -Continue Thiamine, Folic Acid and Multivitamins daily  MDD  Anxiety:Chronic, Stable. -Continue Sertraline 200 mg daily  Malnutrition: Chronic, Stable. -Continue to encourage po supplementation with Ensure   Hospital acquired pneumonia: Treated and resolved.  Asymptomatic.  Completed CTX 3/8-3/12. -Repeat CT in 6-8 weeks to reevaluate  FEN/GI: Regular diet PPx: Xarelto   Status is: Inpatient   Dispo:  Patient From:  Hotel  Planned Disposition:   ALF  Medically stable for discharge:  Yes, difficult to place     Subjective:  Patient reports that the "medication I got after my procedure" gave him a massive headache and that he does not want to take this medication anymore (likely Imdur given it is a common side effect).  Patient was interested in starting the statin medication.  Patient inconsistent in discussion of medications on what he wants to take and what he is not.  Patient reports his muscle cream got very warm on his skin on Friday night and he has not used it since-reports that "no one came to help me" and was dissatisfied.  Objective: Temp:  [97.2 F (36.2 C)-97.8 F (36.6 C)] 97.8 F (36.6 C) (03/20 0400) Pulse Rate:  [77-81]  79 (03/20 0400) Resp:  [16-18] 16 (03/20 0400) BP:  (110-114)/(72-74) 114/72 (03/20 0400) SpO2:  [97 %] 97 % (03/20 0400) Physical Exam: General: NAD, supine in bed Respiratory: Comfortable on room air, no increased work of breathing, able speak in full sentences Abdomen: Flat, nondistended Extremities: Moving all extremities equally and appropriately  Laboratory: Recent Labs  Lab 10/02/20 0212  WBC 6.7  HGB 12.2*  HCT 36.7*  PLT 374   Recent Labs  Lab 09/30/20 0719 10/01/20 1127 10/05/20 0728  NA 138 137 137  K 4.3 3.7 3.6  CL 107 104 108  CO2 23 25 22   BUN 16 25* 16  CREATININE 0.81 1.11 0.73  CALCIUM 9.4 9.8 9.1  GLUCOSE 117* 91 97    Imaging/Diagnostic Tests: No results found.   Rise Patience, DO 10/06/2020, 9:57 AM PGY-1, [place] Intern pager: [phone], text pages welcome

## 2020-10-06 NOTE — Progress Notes (Signed)
Family Medicine Teaching Service Daily Progress Note Intern Pager: [phone]  Patient name: Anthony [name] Medical record number: [id number] Date of birth: [birth year] Age: 65 y.o. Gender: male  Primary Care Provider: Carollee Leitz, MD Consultants: Cardiology, pulmonology Code Status: Full  Pt Overview and Major Events to Date:  1/25 admitted 1/29 medically stable for discharge to supervised residential setting 3/14 Psych - pt has capacity 3/14 cardiology consulted 3/16 pulmonology consulted due to COP on CT 3/17 reassuring low risk CT coronary study, cardiology signed off.  Patient given Dulera  Assessment and Plan: Mr. Ursua a 65 year old male who presented to the hospital with urosepsis and A. fib with RVR due to medication non-adherence;these have been treated and resolved.PMH is significant forA Fib, alcohol use disorder, HFpEF, HTN, chronic back pain, anxiety, depression, right PE.   Difficult Placement Complicating and prolonging hospital stay.[facility] coordinator to evaluate on 10/05/2020 for hopeful placement on 3/28 if accepted. Patient is medically stable for discharge to supervised residential setting. -CSW following to help with placement  Dyspnea on exertion HFpEF(Grade 2 DD):Chronic, improving. Likely multifactorial due to deconditioning, mild central bronchiectasis,andHFpEF.Reassuringly low risk CT coronary study on 3/17.  -Cardiology signed off 3/17, appreciate recommendations -Pulmonology consulted (follow-up outpatient for PFTs) -Repeat CT chest in 6-8 weeks -Continue Dulera twice daily -Continue Lasix 20 mg daily -Up as tolerated  Hyperlipidemia: Chronic, stable. - Continue rosuvastatin 10 mg  Iron deficiency anemia: Chronic,Stable. Last Hgb 12.2 on 3/16.  Patient reported dark stools previously, likely secondary to oral iron. -Continue FeSO4 daily -Recheck CBC 3/30 or sooner if clinical concern   Chronic Back Pain:  Stable. -Continue Tylenol 1gm TID -Contniue Capsaicin prn -Continue Voltaren gel as needed -Continue Baclofen 5 mg TID  Xerosis: Stable -Continue moisturizing lotion BID -Atarax 5 mg prn  Recurrent Hypomagnesemia:Stable -Continue Mag Chloride daily -BMP+ Mag levelqweekly on Mondays  Paroxysmal A-Fib, RVR resolved: Stable,Rate controlled. -Continue Metoprolol 25 mg daily -Continue Amiodarone 200 mg daily -Continue Xarelto 20 mg daily  Alcohol Use Disorder: Stable. No use since admission in 07/2020. -Continue Thiamine, Folic Acid and Multivitamins daily  MDD  Anxiety:Chronic,Stable. -Continue Sertraline 200 mg daily  Malnutrition: Chronic,Stable. -Continue to encourage po supplementation with Ensure   Hospital acquired pneumonia: Treated and resolved. Asymptomatic. Completed CTX 3/8-3/12. -Repeat CT in 6-8 weeks to reevaluate  FEN/GI: Regular diet PPx: Xarelto   Status is: Inpatient   Dispo:  Patient From:  Hotel  Planned Disposition:  ALF  Medically stable for discharge:  Yes, difficult to place      Subjective:  Patient doing well today, no new complaints.   Objective: Temp:  [97.4 F (36.3 C)-98.5 F (36.9 C)] 98.5 F (36.9 C) (03/21 1243) Pulse Rate:  [67-83] 83 (03/21 1243) Resp:  [16-18] 18 (03/21 1243) BP: (102-109)/(67-75) 102/68 (03/21 1243) SpO2:  [98 %-100 %] 100 % (03/21 1243) Weight:  [39 kg] 69 kg (03/21 0351) Physical Exam: General: NAD, sitting up in bed Respiratory: comfortable on room air, no increased WOB, equal chest rise Extremities: able to move all extremities appropriately.   Laboratory: Recent Labs  Lab 10/02/20 0212  WBC 6.7  HGB 12.2*  HCT 36.7*  PLT 374   Recent Labs  Lab 10/01/20 1127 10/05/20 0728  NA 137 137  K 3.7 3.6  CL 104 108  CO2 25 22  BUN 25* 16  CREATININE 1.11 0.73  CALCIUM 9.8 9.1  GLUCOSE 91 97     Imaging/Diagnostic Tests: No results found.   Rise Patience,  DO 10/07/2020, 1:52 PM PGY-1, [place] Intern pager: [phone], text pages welcome

## 2020-10-07 LAB — MAGNESIUM: Magnesium: 1.7 mg/dL (ref 1.7–2.4)

## 2020-10-07 NOTE — Progress Notes (Signed)
CSW spoke with Anitha at [facility] who is planning to come visit patient in person today to determine if she can offer him a bed.  Madilyn Fireman, MSW, LCSW Transitions of Care  Clinical Social Worker II [phone]

## 2020-10-07 NOTE — Progress Notes (Signed)
Family Medicine Teaching Service Daily Progress Note Intern Pager: [phone]  Patient name: Anthony [name] Medical record number: [id number] Date of birth: 1956-01-31 Age: 65 y.o. Gender: male  Primary Care Provider: Carollee Leitz, MD Consultants: Cardiology, Pulmonology  Code Status: Full   Pt Overview and Major Events to Date:  1/25 admitted 1/29 medically stable for discharge to supervised residential setting 3/14 Psych - pt has capacity 3/14 cardiology consulted 3/16 pulmonology consulted due to COP on CT 3/17 reassuring low risk CT coronary study, cardiology signed off. Patient given Dulera  Assessment and Plan: Anthony [name] a 65 year old male who presented to the hospital with urosepsis and A. fib with RVR due to medication non-adherence;these have been treated and resolved.PMH is significant forA Fib, alcohol use disorder, HFpEF, HTN, chronic back pain, anxiety, depression, right PE.   Difficult Placement Patient planning to discharge on 3/23 early to be able to get to the bank and will be going to a shelter for temporary assistance. Patient is medically stable for discharge to supervised residential setting. -CSW following   Dyspnea on exertion HFpEF(Grade 2 DD):Chronic, improving. Likely multifactorial due to deconditioning, mild central bronchiectasis,andHFpEF.Reassuringly low risk CT coronary study on 3/17. -Cardiology signed off 3/17, appreciate recommendations -Pulmonology consulted(follow-up outpatient for PFTs) -Repeat CT chest in 6-8 weeks -Continue Dulera twice daily -Continue Lasix 20 mg daily -Up as tolerated  Hyperlipidemia: Chronic, stable. - Continue rosuvastatin 10 mg  Iron deficiency anemia: Chronic,Stable. Last Hgb 12.2 on 3/16. -Continue FeSO4 daily -Recheck CBC3/30or sooner if clinical concern   Chronic Back Pain: Stable. -Continue Tylenol 1gm TID -Contniue Capsaicin prn -Continue Voltaren gel as needed -Continue  Baclofen 5 mg TID  Xerosis: Stable -Continue moisturizing lotion BID -Atarax 5 mg prn  Recurrent Hypomagnesemia:Stable -Continue Mag Chloride daily -BMP+ Mag levelqweekly on Mondays  Paroxysmal A-Fib, RVR resolved: Stable,Rate controlled. -Continue Metoprolol 25 mg daily -Continue Amiodarone 200 mg daily -Continue Xarelto 20 mg daily  Alcohol Use Disorder: Stable. No use since admission in 07/2020. -Continue Thiamine, Folic Acid and Multivitamins daily  MDD  Anxiety:Chronic,Stable. -Continue Sertraline 200 mg daily  Malnutrition: Chronic,Stable. -Continue to encourage po supplementation with Ensure   Hospital acquired pneumonia: Treated and resolved. Asymptomatic. Completed CTX 3/8-3/12. -Repeat CT in 6-8 weeks to reevaluate  FEN/GI: Regular diet PPx: Xarelto   Status is: Inpatient  Dispo:  Patient From:  Hotel  Planned Disposition:  Shelter  Medically stable for discharge:  Yes      Subjective:  Patient reports that he requests an early AM discharge to be able to go to the bank and get all of his things sorted out.  Social work and attending Dr. Andria Frames around the room interviewing patient together.  Patient provided list of things that he will need going, assistance with getting blanket and some clothing.  Patient will also need his medications prior to discharge as well as a list of shelters and some bus passes.  Objective: Temp:  [97.6 F (36.4 C)-98.5 F (36.9 C)] 98 F (36.7 C) (03/22 0443) Pulse Rate:  [71-83] 71 (03/22 0443) Resp:  [18-19] 19 (03/22 0443) BP: (102-104)/(68-73) 104/69 (03/22 0443) SpO2:  [98 %-100 %] 99 % (03/22 0443) Physical Exam: General: NAD, supine in bed Respiratory: Comfortable on room air, no increased work of breathing Extremities: Moving all extremities equally and appropriately.  Laboratory: Recent Labs  Lab 10/02/20 0212  WBC 6.7  HGB 12.2*  HCT 36.7*  PLT 374   Recent Labs  Lab 10/01/20 1127  10/05/20 0728  10/08/20 0643  NA 137 137 139  K 3.7 3.6 4.2  CL 104 108 110  CO2 25 22 24   BUN 25* 16 15  CREATININE 1.11 0.73 0.98  CALCIUM 9.8 9.1 9.0  GLUCOSE 91 97 102*     Imaging/Diagnostic Tests: No results found.   Anthony Patience, DO 10/08/2020, 9:20 AM PGY-1, [place] Intern pager: [phone], text pages welcome

## 2020-10-08 ENCOUNTER — Other Ambulatory Visit (HOSPITAL_COMMUNITY): Payer: Self-pay | Admitting: Family Medicine

## 2020-10-08 LAB — BASIC METABOLIC PANEL
Anion gap: 5 (ref 5–15)
BUN: 15 mg/dL (ref 8–23)
CO2: 24 mmol/L (ref 22–32)
Calcium: 9 mg/dL (ref 8.9–10.3)
Chloride: 110 mmol/L (ref 98–111)
Creatinine, Ser: 0.98 mg/dL (ref 0.61–1.24)
GFR, Estimated: >60 mL/min (ref >60)
Glucose, Bld: 102 mg/dL — ABNORMAL HIGH (ref 70–99)
Potassium: 4.2 mmol/L (ref 3.5–5.1)
Sodium: 139 mmol/L (ref 135–145)

## 2020-10-08 MED ORDER — ROSUVASTATIN CALCIUM 10 MG PO TABS: 10.0000 mg | ORAL_TABLET | Freq: Every day | ORAL | 0 refills | Status: DC

## 2020-10-08 MED ORDER — THIAMINE HCL 100 MG PO TABS: 100.0000 mg | ORAL_TABLET | Freq: Every day | ORAL | 0 refills | Status: DC

## 2020-10-08 MED ORDER — LORATADINE 10 MG PO TABS: 10.0000 mg | ORAL_TABLET | Freq: Every day | ORAL | 0 refills | Status: DC

## 2020-10-08 MED ORDER — FERROUS SULFATE 325 (65 FE) MG PO TABS: 325.0000 mg | ORAL_TABLET | Freq: Every day | ORAL | 0 refills | Status: DC

## 2020-10-08 MED ORDER — FUROSEMIDE 20 MG PO TABS: 20.0000 mg | ORAL_TABLET | Freq: Every day | ORAL | 0 refills | Status: DC

## 2020-10-08 MED ORDER — SERTRALINE HCL 100 MG PO TABS: 100.0000 mg | ORAL_TABLET | Freq: Every day | ORAL | 0 refills | Status: DC

## 2020-10-08 MED ORDER — METOPROLOL SUCCINATE ER 25 MG PO TB24: 12.5000 mg | ORAL_TABLET | Freq: Every day | ORAL | 0 refills | Status: DC

## 2020-10-08 MED ORDER — AMIODARONE HCL 200 MG PO TABS: 200.0000 mg | ORAL_TABLET | Freq: Every day | ORAL | 0 refills | Status: DC

## 2020-10-08 MED ORDER — MAGNESIUM CHLORIDE 64 MG PO TBEC: 1.0000 | DELAYED_RELEASE_TABLET | Freq: Every day | ORAL | 0 refills | Status: DC

## 2020-10-08 MED ORDER — RIVAROXABAN 20 MG PO TABS: 20.0000 mg | ORAL_TABLET | Freq: Every day | ORAL | 0 refills | Status: DC

## 2020-10-08 MED ORDER — BACLOFEN 5 MG PO TABS: 5.0000 mg | ORAL_TABLET | Freq: Three times a day (TID) | ORAL | 0 refills | Status: DC

## 2020-10-08 MED ORDER — MOMETASONE FURO-FORMOTEROL FUM 100-5 MCG/ACT IN AERO: 2.0000 | INHALATION_SPRAY | Freq: Two times a day (BID) | RESPIRATORY_TRACT | 0 refills | Status: DC

## 2020-10-08 MED ORDER — FOLIC ACID 1 MG PO TABS: 1.0000 mg | ORAL_TABLET | Freq: Every day | ORAL | 0 refills | Status: DC

## 2020-10-08 MED ORDER — CALCIUM CARBONATE ANTACID 500 MG PO CHEW
1.0000 | CHEWABLE_TABLET | Freq: Two times a day (BID) | ORAL | Status: DC | PRN
  Administered 2020-10-08: 200 mg via ORAL
  Filled 2020-10-08: qty 1

## 2020-10-08 MED FILL — XARELTO 20 MG TABLET: 20 | 30 days supply | Qty: 30 | Fill #0

## 2020-10-08 MED FILL — DULERA 100-5 MCG/ACT AERO: 100-5 | 30 days supply | Qty: 13 | Fill #0

## 2020-10-08 MED FILL — AMIODARONE HCL 200 MG TABS: 200 | 30 days supply | Qty: 30 | Fill #0

## 2020-10-08 MED FILL — METOPROLOL SUCCINATE ER 25: 25 | 15 days supply | Qty: 15 | Fill #0

## 2020-10-08 MED FILL — ROSUVASTATIN CALCIUM 10 MG: 10 | 30 days supply | Qty: 30 | Fill #0

## 2020-10-08 MED FILL — FUROSEMIDE 20 MG TAB: 20 | 30 days supply | Qty: 30 | Fill #0

## 2020-10-08 MED FILL — BACLOFEN 5 MG TABS: 5 | 10 days supply | Qty: 30 | Fill #0

## 2020-10-08 MED FILL — SERTRALINE HCL 100 MG TAB: 100 | 30 days supply | Qty: 30 | Fill #0

## 2020-10-08 MED FILL — LORATADINE 10 MG TABLET: 10 | 30 days supply | Qty: 30 | Fill #0

## 2020-10-08 MED FILL — FOLIC ACID 1 MG TABS: 1 | 30 days supply | Qty: 30 | Fill #0

## 2020-10-08 MED FILL — VITAMIN B-1 100 MG TABS: 100 | 30 days supply | Qty: 30 | Fill #0

## 2020-10-08 NOTE — Progress Notes (Signed)
Patient continues to be compliant with meds; tolerates inhaler well. Complained of indigestion, prn tums ordered and given. Patient states he is feeling better.

## 2020-10-08 NOTE — Progress Notes (Signed)
Spoke to [facility] regarding this pt's medications for discharge tomorrow. Lydia recommended we send the medications to [facility] and she will work on getting the meds via [facility].  Lattie Haw MD  PGY2, [place]

## 2020-10-08 NOTE — TOC Progression Note (Signed)
Transition of Care [facility]) - Progression Note    Patient Details  Name: Anthony [name] MRN: [id number] Date of Birth: April 09, 1956  Transition of Care [facility]) CM/SW [place], RN Phone Number: 10/08/2020, 2:48 PM  Clinical Narrative:    Case management spoke with the patient on the phone regarding discharging planning to [facility] tomorrow - since patient is refusing SNF placement at this time.  The patient is going to be discharged tomorrow through [facility] Medicine group that is following the patient.  I spoke with the patient on the phone regarding access to money to pay for his medications from [place].  The patient is having the medications delivered to his hospital room prior to discharge tomorrow.  The patient currently does not have cash in his wallet or credit card.  I called and spoke with the patient's brother, [name] on the phone and he is willing to pay the patient's co-Pay to [place].  I spoke with [facility] and they are calling the patient's brother to receive payment.  CM and MSW will continue to follow the patient for discharge planning to [facility] tomorrow.   Expected Discharge Plan: Homeless Shelter Barriers to Discharge: No Barriers Identified  Expected Discharge Plan and Services Expected Discharge Plan: Homeless Shelter In-house Referral: Clinical Social Work Discharge Planning Services: CM Consult,Medication Assistance   Living arrangements for the past 2 months:  (Discharging to [facility] tomorrow.)                                       Social Determinants of Health (SDOH) Interventions    Readmission Risk Interventions Readmission Risk Prevention Plan 10/08/2020  Transportation Screening Complete  PCP or Specialist Appt within 3-5 Days Complete  HRI or [place] Complete  Social Work Consult for [place] Planning/Counseling Complete  Palliative Care Screening Complete  Medication Review [employer]) Complete  Some  recent data might be hidden

## 2020-10-08 NOTE — Progress Notes (Addendum)
2pm: CSW searched for clothing in TOC closet, no size appropriate clothing available.  CSW delivered shelter list and bus passes to unit and placed in discharge packet on unit.  Unit Director aware of discharge plan.  9am: CSW met with patient at bedside to discuss discharge plan. Patient states he prioritizes his independence over housing. Patient reports he desires to be discharged to a shelter not to any facility that will take his income.  Dr. Andria Frames and Dr. Oleh Genin presented to patient's room to discuss discharge plan. Patient agreeable to receive the following prior to discharge: -a list of shelters to utilize if he desires -clothing  -bus passes -medications from [place] will attempt to obtain items above and deliver them to patient prior to his discharge tomorrow morning.  Madilyn Fireman, MSW, LCSW Transitions of Care  Clinical Social Worker II [phone]

## 2020-10-08 NOTE — Plan of Care (Signed)
  Problem: Education: Goal: Knowledge of General Education information will improve Description Including pain rating scale, medication(s)/side effects and non-pharmacologic comfort measures Outcome: Progressing   

## 2020-10-08 NOTE — Discharge Instructions (Signed)
You were admitted to the hospital after being found down with weakness. You had electrolytes that were appropriately repleted. You had shortness of breath caused by iron deficiency anemia. Please continue to take iron supplementation and follow up with your PCP to check anemia levels. You also had shortness of breath due to a pneumonia, and you may use dulera inhaler daily. Please follow up with pulmonology to get pulmonary function tests. You had a full evaluation with cardiology and they recommended using a statin to decrease risk for heart attacks and strokes.

## 2020-10-08 NOTE — Discharge Summary (Signed)
[facility] Discharge Summary  Patient name: Anthony [name] Medical record number: [id number] Date of birth: [birth year] Age: 65 y.o. Gender: male Date of Admission: 08/13/2020  Date of Discharge: 10/09/2020  Admitting Physician: Anthony Del, DO  Primary Care Provider: Carollee Leitz, MD Consultants: Cardiology, Pulmonology  Indication for Hospitalization: Acute encephalopathy  Discharge Diagnoses/Problem List:  Patient Active Problem List   Diagnosis Date Noted  . Cough   . Hypomagnesemia   . Protein-calorie malnutrition, severe 08/22/2020  . Alcohol use disorder, severe, in controlled environment ([place])   . Pressure injury of skin 08/18/2020  . Urinary tract infection without hematuria   . Severe sepsis ([place]) 08/14/2020  . Atrial fibrillation with RVR ([place]) 08/13/2020  . Rhabdomyolysis 07/22/2020  . Cerebral ventriculomegaly 07/22/2020  . Hemoglobin decreased 07/22/2020  . History of delirium tremons 07/22/2020  . Hypoalbuminemia due to protein-calorie malnutrition ([place]) 07/22/2020  . Tobacco use disorder 07/22/2020  . Alcohol withdrawal delirium ([place])   . Dehydration   . Alcohol abuse with alcohol-induced mood disorder ([place]) 07/18/2018  . Depression 05/26/2018  . Need for immunization against influenza 04/25/2018  . Alcohol withdrawal ([place]) 09/01/2017  . DOE (dyspnea on exertion) 11/27/2016  . Actinic keratosis 11/27/2016  . Macrocytic anemia 11/23/2016  . History of pulmonary embolus (PE) 11/02/2016  . Hiatal hernia 09/14/2016  . Skin cancer 08/13/2016  . Poor social situation 06/09/2013  . Tinea versicolor 06/08/2013  . 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 HTN (hypertension) 04/11/2012  . Alcohol use disorder, severe, dependence ([place]) 09/16/2006     Disposition: Home/Shelter  Discharge Condition: Stable, improved  Discharge Exam:  General: NAD, standing up gathering clothing together. Respiratory: comfortable on room air, no  increased WOB, able to speak in full sentences MSK: Up walking around the room, no gait abnormalities noted Extremities: no muscle atrophy noted, patient moving all extremities appropriately Neuro: A&O x4.  Brief Hospital Course:  Anthony [name] is a 65 y.o. male with a history of alcohol use disorder and A. Fib who presented with urosepsis and A Fib with RVR. Hospital course outlined by problem below:  Sepsis  E. Coli UTI Patient initially meeting sepsis criteria with tachypnea, tachycardia, and leukocytosis.  Some confusion also noted, however appeared to be close to his baseline on our evaluation.  He was also noted to have a UTI positive for E. coli resistant only to ampicillin.  He was initially started on CTX, which was subsequently narrowed to cephalexin once urine culture susceptibilities resulted for a total of 10 days of antibiotic treatment.  Persistent Dyspnea  Cryptogenic Organizing Pneumonia  Grade 2 Diastolic Dysfunction Patient had persistent dyspnea that was worked up with CBC showing iron deficiency anemia- treated with oral iron supplementation. Patient continued to note dyspnea despite supplementation, so chest x-ray obtained that showed new opacity in apex of left lung on 3/6, questionable infectious or inflammatory process. CT chest on 3/7 most consistent with infectious process. He received CTX for 5 days to treat HAP, abx from 3/8-12. After completing CTX course, patient continued to note dyspnea. Cardiology consulted and echo showed preserved EF 60-65% with Grade 2 diastolic dysfunction and patient was started on low dose Lasix of 20mg  daily. Patient was insistent on receiving work up for ischemic heart disease with stress test, but cardiology recommended coronary CTA as best test choice. Coronary CTA on 3/16 showed mild non-obstructive stenoses and concern for cryptogenic organizing pneumonia. Pulmonology consulted and recommended no further antibiotic treatment, repeat chest  CT in 6-8 weeks, follow up with pulmonology as an outpatient, and offered dulera for dyspnea, which he was provided upon discharge.  A Fib with RVR Patient presented in A. fib with RVR 150 on presentation in the setting of medication noncompliance.  He was treated with metoprolol and initially on amiodarone drip due to soft BP before transitioning to oral amiodarone.  For anticoagulation, he was transitioned from rivaroxaban to enoxaparin initially due to poor p.o. intake, but this was transitioned back to p.o. meds once he was eating adequate meals.  Alcohol use disorder He was started on CIWA monitoring initially, but his CIWA scores were stable and he did not require any lorazepam per CIWA protocol, so monitoring was discontinued. Patient noted no seizures or withdrawal symptoms throughout the remainder of hospitalization.  Hypomagnesemia Persistently hypomagnesemic throughout most recent admission in during this admission.  He was continued on magnesium oxide 400 mg twice daily until experiencing multiple episodes of diarrhea. Discontinued magnesium oxide and given IV Mg. Thus was switched to extended release magnesium chloride supplementation which improved his symptoms.  Patient discharged with supplementation.  Magnesium on discharge of 1.7  Iron Deficiency Anemia PO iron started in the hospital.  Would benefit from a colonoscopy in the outpatient setting.  All other issues stable and chronic.    Issues to follow up 1. Patient experiencing occasional leg cramping, possibly due to low Mg levels, consider repeat testing as needed (last tested on 3/21 and was 1.7). On magnesium chloride 64 mg supplement daily.  2. Patient had multiple hypotensive episodes during hospitalization that resolved, reassess to possibly consider holding metoprolol if this becomes a consistent issue.  3. Repeat CT Chest in 6-8 weeks 4. Follow-up with Pulmonary for PFTs. Office contact info in AVS 5. Patient started  on Dulera 2puffs BID.  6. Iron deficiency anemia, presently on oral supplementation. Last CBC 3/16 with Hgb 12.2. Recheck CBC.   Significant Procedures: Echocardiogram  Significant Labs and Imaging:  No results for input(s): WBC, HGB, HCT, PLT in the last 168 hours. Recent Labs  Lab 10/05/20 0728 10/07/20 0450 10/08/20 0643  NA 137  --  139  K 3.6  --  4.2  CL 108  --  110  CO2 22  --  24  GLUCOSE 97  --  102*  BUN 16  --  15  CREATININE 0.73  --  0.98  CALCIUM 9.1  --  9.0  MG  --  1.7  --     DG Ankle Complete Right  Result Date: 09/29/2020 CLINICAL DATA:  RIGHT ankle swelling. EXAM: RIGHT ANKLE - COMPLETE 3+ VIEW COMPARISON:  None FINDINGS: Signs of prior trauma to the RIGHT ankle. Healed fractures of the distal fibula/lateral malleolus and signs of fixation of medial malleolus. Ankle mortise is preserved. Mild soft tissue swelling along the lateral malleolus without visible fracture. No signs of dislocation. Vascular calcifications in the soft tissues. Degenerative changes about the ankle related to posttraumatic changes. Question of subluxation and or hammertoe deformities about the toes, difficult to establish which of these toes may be involved IMPRESSION: 1. Mild soft tissue swelling along the lateral malleolus without visible fracture. 2. Degenerative changes about the ankle related to posttraumatic changes. 3. Question of subluxation and or hammertoe deformities about the toes, difficult to establish which of these toes may be involved. Correlate with any pain about the forefoot with dedicated imaging as warranted. Electronically Signed   By: Zetta Bills M.D.   On: 09/29/2020  19:31    CT CHEST W CONTRAST  Result Date: 09/23/2020 CLINICAL DATA:  Abnormal chest x-ray EXAM: CT CHEST WITH CONTRAST TECHNIQUE: Multidetector CT imaging of the chest was performed during intravenous contrast administration. CONTRAST:  45mL OMNIPAQUE IOHEXOL 300 MG/ML  SOLN COMPARISON:  None.  FINDINGS: Cardiovascular: Normal heart size. Coronary artery calcification. No pericardial effusion. Main pulmonary artery is normal in caliber. No evidence of central pulmonary embolism. Thoracic aorta is normal in caliber. Mediastinum/Nodes: Thyroid is unremarkable. No enlarged lymph nodes. Evidence of prior surgery at the gastroesophageal junction. Lungs/Pleura: There is patchy ground-glass density in the left upper lobe accounting for abnormality on radiograph. Mild bibasilar atelectasis and scarring. Small calcified granuloma of the right upper lobe. No pleural effusion. No pneumothorax. Upper Abdomen: Surgical clips adjacent to the spleen. No acute abnormality. Musculoskeletal: Degenerative changes of the included spine. Small central calcified disc protrusion at C6-C7. Chronic L2 and L3 compression fractures. No acute osseous abnormality IMPRESSION: Patchy ground-glass density within the left upper lobe accounting for chest radiograph abnormality. Likely infectious/inflammatory in etiology. Electronically Signed   By: Macy Mis M.D.   On: 09/23/2020 16:56   MR BRAIN W WO CONTRAST  Result Date: 08/16/2020 CLINICAL DATA:  Encephalopathy EXAM: MRI HEAD WITHOUT AND WITH CONTRAST TECHNIQUE: Multiplanar, multiecho pulse sequences of the brain and surrounding structures were obtained without and with intravenous contrast. CONTRAST:  6.36mL GADAVIST GADOBUTROL 1 MMOL/ML IV SOLN COMPARISON:  None. FINDINGS: Brain: No acute infarct, mass effect or extra-axial collection. No acute or chronic hemorrhage. There is multifocal hyperintense T2-weighted signal within the white matter. Generalized volume loss without a clear lobar predilection. The midline structures are normal. There is no abnormal contrast enhancement. Vascular: Major flow voids are preserved. Skull and upper cervical spine: Normal calvarium and skull base. Visualized upper cervical spine and soft tissues are normal. Sinuses/Orbits:No paranasal sinus  fluid levels or advanced mucosal thickening. No mastoid or middle ear effusion. Normal orbits. IMPRESSION: 1. No acute intracranial abnormality. 2. Findings of mild chronic microvascular disease and generalized volume loss. 3. No evidence of normal pressure hydrocephalus. Electronically Signed   By: Ulyses Jarred M.D.   On: 08/16/2020 19:16   CT CORONARY MORPH W/CTA COR W/SCORE W/CA W/CM &/OR WO/CM  Addendum Date: 10/02/2020   ADDENDUM REPORT: 10/02/2020 17:59 CLINICAL DATA:  65 Year-old White Male EXAM: Cardiac/Coronary  CTA TECHNIQUE: The patient was scanned on a [employer]. FINDINGS: A 100 kV prospective scan was triggered in the descending thoracic aorta at 111 HU's. Axial non-contrast 3 mm slices were carried out through the heart. The data set was analyzed on a dedicated work station and scored using the [place]. Gantry rotation speed was 250 msecs and collimation was .6 mm. No beta blockade and 0.8 mg of sl NTG was given. The 3D data set was reconstructed in 5% intervals of the 67-82 % of the R-R cycle. Diastolic phases were analyzed on a dedicated work station using MPR, MIP and VRT modes. The patient received 80 cc of contrast. Aorta:  Normal size.  No calcifications.  No dissection. Aortic Valve:  Tri-leaflet.  Mild annular calcification. Coronary Arteries:  Normal coronary origin.  Right dominance. Coronary calcium score of 884. This was 91st percentile for age, sex, and race matched control. RCA is a large dominant artery that gives rise to PDA and PLA. There is no plaque. Left main is a large artery that gives rise to LAD and LCX arteries. There is a minimal calcification from  the annulus that abuts the ostium of the left main. LAD is a large vessel that gives rise to one large D1 Branch. There is a minimal non-obstructive (1-24%) calcified plaque in the diagonal vessel. There are tandem moderate stenoses (50-70%) calcified plaques in the mid vessel. There are tandem calcified  plaques in the distal vessel (mild non-obstructive 25-49% and moderate stenosis 50-70%). LCX is a non-dominant artery that gives rise to one large OM1 branch that bifurcates. There is a minimal non-obstructive (1-24%) soft plaque in the proximal vessel. 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 Normal left atrial appendage without a thrombus. Normal size of the pulmonary artery. Extra-cardiac findings: See attached radiology report for non-cardiac structures. IMPRESSION: 1. Coronary calcium score of 884. This was 91st percentile for age, sex, and race matched control. 2. Normal coronary origin.  Right dominance. 3. CAD-RADS 3. Moderate stenosis. Consider symptom-guided anti-ischemic pharmacotherapy as well as risk factor modification per guideline directed care. Additional analysis with CT FFR will be submitted. 4. 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 Electronically Signed   By: Rudean Haskell MD   On: 10/02/2020 17:59   Result Date: 10/02/2020 EXAM: OVER-READ INTERPRETATION  CT CHEST The following report is an over-read performed by radiologist Dr. Vinnie Langton of [facility], [place] on 10/02/2020. This over-read does not include interpretation of cardiac or coronary anatomy or pathology. The coronary calcium score/coronary CTA interpretation by the cardiologist is attached. COMPARISON:  None. FINDINGS: Aortic atherosclerosis. Patchy areas of ground-glass attenuation are noted in the lungs, most evident in the left upper lobe within the visualized portions of the thorax there are no suspicious appearing pulmonary nodules or masses, there is no confluent consolidative airspace disease, no pleural effusions, no pneumothorax and no lymphadenopathy. Visualized portions of the upper abdomen demonstrate postoperative changes at the gastroesophageal junction. There are no  aggressive appearing lytic or blastic lesions noted in the visualized portions of the skeleton. IMPRESSION: 1. Patchy ground-glass attenuation in the lungs, most evident in the left upper lobe. 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 2.  Aortic Atherosclerosis (ICD10-I70.0). Electronically Signed: By: Vinnie Langton M.D. On: 10/02/2020 16:22   DG CHEST PORT 1 VIEW  Result Date: 09/22/2020 CLINICAL DATA:  Cough EXAM: PORTABLE CHEST 1 VIEW COMPARISON:  July 21, 2020 FINDINGS: The cardiomediastinal silhouette is unchanged in contour.Elevation the RIGHT hemidiaphragm. No pleural effusion. No pneumothorax. Unchanged linear opacities of the RIGHT lung base. New hazy opacity of the LEFT apex. Surgical clips project over the upper abdomen. Multilevel degenerative changes of the thoracic spine. Remote RIGHT clavicular fracture. IMPRESSION: There is a new hazy opacity at the LEFT apex. This may reflect infection. Followup PA and lateral chest X-ray is recommended in 3-4 weeks following trial of antibiotic therapy to ensure resolution and exclude underlying malignancy. Electronically Signed   By: Valentino Saxon MD   On: 09/22/2020 14:50   CT CORONARY FRACTIONAL FLOW RESERVE DATA PREP  Result Date: 10/03/2020 EXAM: CT-FFR ANALYSIS CLINICAL DATA:  LAD calcification FINDINGS: CT-FFR analysis was performed on the original cardiac CT angiogram dataset. Diagrammatic representation of the CT-FFR analysis is provided in a separate PDF document in PACS. This dictation was created using the PDF document and an interactive 3D model of the results. 3D model is not available in the EMR/PACS. Normal FFR range  is >0.80. 1. Left Main: No significant functional stenosis, CT-FFR 0.97. 2. LAD: No significant functional stenosis, CT-FFR 0.86 distal vessel; CT-FFr 0.83 distal diagonal vessel. 3. LCX: No significant functional stenosis,  CT-FFR 0.89 distal vessel. 4. RCA: No significant functional stenosis, CT-FFR 0.90. IMPRESSION: 1. CT FFR analysis shows no evidence of significant functional stenosis. Recommend aggressive risk factor management for non-obstructive calcifications. Rudean Haskell MD Electronically Signed   By: Rudean Haskell MD   On: 10/03/2020 14:32   ECHOCARDIOGRAM COMPLETE  Result Date: 09/29/2020    ECHOCARDIOGRAM REPORT   Patient Name:   Cassie A Whaling Date of Exam: 09/29/2020 Medical Rec #:  [id number]    Height:       68.0 in Accession #:    [id number]   Weight:       157.8 lb Date of Birth:  [birth year]    BSA:          1.848 m Patient Age:    24 years     BP:           117/80 mmHg Patient Gender: M            HR:           87 bpm. Exam Location:  Inpatient Procedure: 2D Echo, 3D Echo, Cardiac Doppler, Color Doppler and Strain Analysis Indications:    Dyspnea  History:        Patient has prior history of Echocardiogram examinations, most                 recent 07/26/2020. Risk Factors:Hypertension.  Sonographer:    Luisa Hart RDCS Referring Phys: [place]  1. Tracking is not accurate on GLS measurements.  2. Left ventricular ejection fraction, by estimation, is 60 to 65%. The left ventricle has normal function. The left ventricle has no regional wall motion abnormalities. Left ventricular diastolic parameters are consistent with Grade II diastolic dysfunction (pseudonormalization). Elevated left ventricular end-diastolic pressure.  3. Right ventricular systolic function is normal. The right ventricular size is normal. There is normal pulmonary artery systolic pressure.  4. Left atrial size was severely dilated.  5. Right atrial size was mildly dilated.  6. The mitral valve is normal in structure. Trivial mitral valve regurgitation. No evidence of mitral stenosis.  7. The aortic valve is tricuspid. Aortic valve regurgitation is not visualized. No aortic stenosis is present.  8. The  inferior vena cava is normal in size with greater than 50% respiratory variability, suggesting right atrial pressure of 3 mmHg. FINDINGS  Left Ventricle: Left ventricular ejection fraction, by estimation, is 60 to 65%. The left ventricle has normal function. The left ventricle has no regional wall motion abnormalities. The left ventricular internal cavity size was normal in size. There is  no left ventricular hypertrophy. Left ventricular diastolic parameters are consistent with Grade II diastolic dysfunction (pseudonormalization). Elevated left ventricular end-diastolic pressure. Right Ventricle: The right ventricular size is normal. No increase in right ventricular wall thickness. Right ventricular systolic function is normal. There is normal pulmonary artery systolic pressure. The tricuspid regurgitant velocity is 2.69 m/s, and  with an assumed right atrial pressure of 3 mmHg, the estimated right ventricular systolic pressure is 73.5 mmHg. Left Atrium: Left atrial size was severely dilated. Right Atrium: Right atrial size was mildly dilated. Pericardium: There is no evidence of pericardial effusion. Mitral Valve: The mitral valve is normal in structure. Trivial mitral valve regurgitation. No evidence of mitral valve stenosis. MV peak  gradient, 7.1 mmHg. The mean mitral valve gradient is 2.0 mmHg. Tricuspid Valve: The tricuspid valve is normal in structure. Tricuspid valve regurgitation is trivial. No evidence of tricuspid stenosis. Aortic Valve: The aortic valve is tricuspid. Aortic valve regurgitation is not visualized. No aortic stenosis is present. Aortic valve mean gradient measures 3.0 mmHg. Aortic valve peak gradient measures 4.8 mmHg. Aortic valve area, by VTI measures 2.36 cm. Pulmonic Valve: The pulmonic valve was normal in structure. Pulmonic valve regurgitation is not visualized. No evidence of pulmonic stenosis. Aorta: The aortic root is normal in size and structure. Venous: The inferior vena cava is  normal in size with greater than 50% respiratory variability, suggesting right atrial pressure of 3 mmHg. IAS/Shunts: No atrial level shunt detected by color flow Doppler.  LEFT VENTRICLE PLAX 2D LVIDd:         4.80 cm      Diastology LVIDs:         2.50 cm      LV e' medial:    7.94 cm/s LV PW:         0.70 cm      LV E/e' medial:  13.5 LV IVS:        0.80 cm      LV e' lateral:   10.30 cm/s LVOT diam:     1.90 cm      LV E/e' lateral: 10.4 LV SV:         52 LV SV Index:   28           2D Longitudinal Strain LVOT Area:     2.84 cm     2D Strain GLS (A2C):   -10.1 %                             2D Strain GLS (A3C):   -16.1 %                             2D Strain GLS (A4C):   -15.7 % LV Volumes (MOD)            2D Strain GLS Avg:     -14.0 % LV vol d, MOD A4C: 122.0 ml LV vol s, MOD A4C: 28.6 ml LV SV MOD A4C:     122.0 ml                             3D Volume EF:                             3D EF:        32 %                             LV EDV:       134 ml                             LV ESV:       91 ml                             LV SV:        43 ml RIGHT VENTRICLE RV S  prime:     11.70 cm/s TAPSE (M-mode): 2.7 cm LEFT ATRIUM           Index       RIGHT ATRIUM           Index LA diam:      4.00 cm 2.16 cm/m  RA Area:     26.60 cm LA Vol (A2C): 84.8 ml 45.88 ml/m RA Volume:   83.80 ml  45.34 ml/m  AORTIC VALVE                   PULMONIC VALVE AV Area (Vmax):    2.11 cm    PV Vmax:       0.64 m/s AV Area (Vmean):   2.21 cm    PV Vmean:      50.200 cm/s AV Area (VTI):     2.36 cm    PV VTI:        0.133 m AV Vmax:           110.00 cm/s PV Peak grad:  1.6 mmHg AV Vmean:          77.400 cm/s PV Mean grad:  1.0 mmHg AV VTI:            0.220 m AV Peak Grad:      4.8 mmHg AV Mean Grad:      3.0 mmHg LVOT Vmax:         81.80 cm/s LVOT Vmean:        60.400 cm/s LVOT VTI:          0.183 m LVOT/AV VTI ratio: 0.83  AORTA Ao Root diam: 3.00 cm Ao Asc diam:  3.30 cm MITRAL VALVE                 TRICUSPID VALVE MV Area  (PHT): 4.57 cm      TR Peak grad:   28.9 mmHg MV Area VTI:   2.15 cm      TR Vmax:        269.00 cm/s MV Peak grad:  7.1 mmHg MV Mean grad:  2.0 mmHg      SHUNTS MV Vmax:       1.33 m/s      Systemic VTI:  0.18 m MV Vmean:      65.0 cm/s     Systemic Diam: 1.90 cm MV Decel Time: 166 msec MR Peak grad:    70.2 mmHg MR Mean grad:    48.0 mmHg MR Vmax:         419.00 cm/s MR Vmean:        331.0 cm/s MR PISA:         0.57 cm MR PISA Eff ROA: 3 mm MR PISA Radius:  0.30 cm MV E velocity: 107.00 cm/s MV A velocity: 38.10 cm/s MV E/A ratio:  2.81 Skeet Latch MD Electronically signed by Skeet Latch MD Signature Date/Time: 09/29/2020/1:46:06 PM    Final         Results/Tests Pending at Time of Discharge: None  Discharge Medications:  Allergies as of 10/09/2020   Not on File     Medication List    STOP taking these medications   magnesium oxide 400 (241.3 Mg) MG tablet Commonly known as: MAG-OX     TAKE these medications   amiodarone 200 MG tablet Commonly known as: PACERONE Take 1 tablet (200 mg total) by mouth daily.   Baclofen 5 MG Tabs Take 5 mg by mouth 3 (  three) times daily.   ferrous sulfate 325 (65 FE) MG tablet Take 1 tablet (325 mg total) by mouth daily. What changed: when to take this   folic acid 1 MG tablet Commonly known as: FOLVITE Take 1 tablet (1 mg total) by mouth daily.   furosemide 20 MG tablet Commonly known as: LASIX Take 1 tablet (20 mg total) by mouth daily.   loratadine 10 MG tablet Commonly known as: CLARITIN Take 1 tablet (10 mg total) by mouth daily.   magnesium chloride 64 MG Tbec SR tablet Commonly known as: SLOW-MAG Take 1 tablet (64 mg total) by mouth daily.   metoprolol succinate 25 MG 24 hr tablet Commonly known as: TOPROL-XL Take 0.5 tablets (12.5 mg total) by mouth daily.   mometasone-formoterol 100-5 MCG/ACT Aero Commonly known as: DULERA Inhale 2 puffs into the lungs 2 (two) times daily.   rivaroxaban 20 MG Tabs  tablet Commonly known as: XARELTO Take 1 tablet (20 mg total) by mouth daily with supper.   rosuvastatin 10 MG tablet Commonly known as: CRESTOR Take 1 tablet (10 mg total) by mouth daily.   sertraline 100 MG tablet Commonly known as: ZOLOFT Take 1 tablet (100 mg total) by mouth daily. What changed: how much to take   thiamine 100 MG tablet Take 1 tablet (100 mg total) by mouth daily.       Discharge Instructions: Please refer to Patient Instructions section of EMR for full details.  Patient was counseled important signs and symptoms that should prompt return to medical care, changes in medications, dietary instructions, activity restrictions, and follow up appointments.   Follow-Up Appointments:  Follow-up Information    [place], Crista Luria, [place] Follow up on 09/18/2020.   Specialty: Cardiology Why: at 2:15pm for your follow up appt.  Contact information: [address] [place] [address] [phone]        Margaretha Seeds, MD Follow up.   Specialty: Pulmonary Disease Why: Please call office to schedule office follow-up after you leave the hospital Contact information: [place] [address] [phone]        Ava. Go on 10/16/2020.   Specialty: Family Medicine Why: Please arrive by 10:55 AM for an 11:10 AM appt Contact information: [address] [id number] [place] [address] [phone]              Rise Patience, DO 10/09/2020, 7:47 AM PGY-1, [place]

## 2020-10-09 DIAGNOSIS — F411 Generalized anxiety disorder: Secondary | ICD-10-CM

## 2020-10-09 NOTE — TOC Transition Note (Signed)
Transition of Care [facility]) - CM/SW Discharge Note   Patient Details  Name: Anthony [name] MRN: [id number] Date of Birth: 12-27-1955  [facility] CM/SW Contact:  Curlene Labrum, RN Phone Number: 10/09/2020, 8:39 AM   Clinical Narrative:    Case management called and spoke with [facility] pharmacy this morning and they will be delivering discharge medications to the patient's room in a few minutes.  The patient is ready for discharge to the bus stop once the patient receives his discharge medications.  I spoke with Anthony Gell, RN at bedside and she is aware of discharge plans.    I called and spoke with the patient's brother, Anthony [name], and he is aware that the patient refused SNF placement and has chosen to discharge to a community shelter.  Anthony [name], brother paid the patient's TOC medications co-payment last night and is aware of the patient's pending discharge.  MSW and CM will continue to follow the patient for discharge planning to shelter this morning.   Final next level of care: Homeless Shelter Barriers to Discharge: No Barriers Identified   Patient Goals and CMS Choice Patient states their goals for this hospitalization and ongoing recovery are:: Patient is refusing SNF placement and requests to discharge to homeless shelter. CMS Medicare.gov Compare Post Acute Care list provided to:: Patient Choice offered to / list presented to : Patient  Discharge Placement                       Discharge Plan and Services In-house Referral: Clinical Social Work Discharge Planning Services: CM Consult,Medication Assistance                                 Social Determinants of Health (SDOH) Interventions     Readmission Risk Interventions Readmission Risk Prevention Plan 10/08/2020  Transportation Screening Complete  PCP or Specialist Appt within 3-5 Days Complete  HRI or [place] Complete  Social Work Consult for [place] Planning/Counseling  Complete  Palliative Care Screening Complete  Medication Review Press photographer) Complete  Some recent data might be hidden

## 2020-10-09 NOTE — Progress Notes (Signed)
Discharge order acknowledged. Patient provided paperwork, bus passes, and shelter list. Will discharge momentarily.

## 2020-10-16 ENCOUNTER — Other Ambulatory Visit: Payer: Self-pay

## 2020-10-16 ENCOUNTER — Ambulatory Visit (INDEPENDENT_AMBULATORY_CARE_PROVIDER_SITE_OTHER): Payer: Medicare Other | Admitting: Family Medicine

## 2020-10-16 VITALS — BP 115/60 | HR 95 | Ht 68.0 in | Wt 161.8 lb

## 2020-10-16 DIAGNOSIS — I4891 Unspecified atrial fibrillation: Secondary | ICD-10-CM | POA: Diagnosis present

## 2020-10-16 DIAGNOSIS — R0609 Other forms of dyspnea: Secondary | ICD-10-CM

## 2020-10-16 DIAGNOSIS — D509 Iron deficiency anemia, unspecified: Secondary | ICD-10-CM

## 2020-10-16 DIAGNOSIS — R0600 Dyspnea, unspecified: Secondary | ICD-10-CM

## 2020-10-16 NOTE — Patient Instructions (Addendum)
It was great seeing you today!  Follow up with your PCP, as needed.   For pain: Take 1000 mg (2 -500 mg) tablets every 8 hours as needed. Continue taking your muscle relaxers.   Restart which ever heart medication your stopped (amiodarone vs metoprolol). Take Tylenol for pain. Be sure to call the cardiology office for follow up before stopping any heart medications for atrial fibrillation (see below): [place] Address: [address], [place], [place] [address] Phone: [phone]  Be sure to call the belly doctors to schedule your colonoscopy: [facility]  Located in: [facility] [place] Address: [place], [place], [place] [address] Phone: [phone]  Call the lung doctors for a follow up appointment. See contact information below.  Dr. Margaretha Seeds  [phone] [place], [place] [address]  [phone]  Regarding lab work today:  Due to recent changes in healthcare laws, you may see the results of your imaging and laboratory studies on MyChart before your provider has had a chance to review them.  I understand that in some cases there may be results that are confusing or concerning to you. Not all laboratory results come back in the same time frame and you may be waiting for multiple results in order to interpret others.  Please give [place] 72 hours in order for your provider to thoroughly review all the results before contacting the office for clarification of your results. If everything is normal, you will get a letter in the mail or a message in My Chart. Please give [place] a call if you do not hear from [place] after 2 weeks.  Please bring all of your medications with you to each visit.    If you haven't already, sign up for My Chart to have easy access to your labs results, and communication with your primary care physician.  Feel free to call with any questions or concerns at any time, at [phone].    Take care,  Dr. Rushie [facility]

## 2020-10-16 NOTE — Assessment & Plan Note (Addendum)
Stable. Regular rate and rhythm, likely sinus on exam. Pt stopped taking one of these medications (metroprolol or amiodarone) due to headache. Compliant with Xarelto. Pt encouraged to take both medications. Risks and benefits discussed. No scheduled follow up with cardiology. Referral placed to cardiology. [facility] contact information provided.

## 2020-10-16 NOTE — Assessment & Plan Note (Signed)
Continue oral supplementation. Last CBC 3/16 with Hgb 12.2. Recheck CBC. Contact information given for colonoscopy as he was previously referred in 2020.

## 2020-10-16 NOTE — Progress Notes (Signed)
   SUBJECTIVE:   CHIEF COMPLAINT / HPI:   Chief Complaint  Patient presents with  . Hosp f/u     Sender A Dusza is a 65 y.o. male here for hospital follow up. Pt admitted late January for AFIB with RVR.  Pt discharged about a week ago after nearly 2 month hospitalization due to difficult disposition. Pt is staying at a motel and is working on getting stable housing.    Pt taking Xarelto. Denies blood in stool or urine. He is taking one of his two heart medications as it is causing him to have a headache. States the doctor that started it in hospital told him not to take that medication if he gets a headache. Pt unsure which medication it is (metroprolol vs amiodarone).   Taking Lasix started during his admission. Has some lower leg swelling. Denies tobacco. States his shortness of breath has improved with Dulera inhaler.  No recent falls and reports being active. Leg cramping has resolved. Remains active. Pt reports consistent pain in his lower back. States that "Tylenol is not made for that. Taking muscle relaxers.     PERTINENT  PMH / PSH: reviewed and updated as appropriate   OBJECTIVE:   BP 115/60   Pulse 95   Ht 5\' 8"  (1.727 m)   Wt 161 lb 12.8 oz (73.4 kg)   SpO2 95%   BMI 24.60 kg/m    GEN: well appearing male, in no acute distress CV: regular rate and rhythm, no murmurs appreciated RESP: no increased work of breathing, clear to ascultation bilaterally, speaking in full sentences without pause MSK: no LE edema, normal gait SKIN: warm, dry    ASSESSMENT/PLAN:   Atrial fibrillation (HCC) Stable. Regular rate and rhythm, likely sinus on exam. Pt stopped taking one of these medications (metroprolol or amiodarone) due to headache. Compliant with Xarelto. Pt encouraged to take both medications. Risks and benefits discussed. No scheduled follow up with cardiology. Referral placed to cardiology. [facility] contact information provided.   Iron deficiency anemia Continue  oral supplementation. Last CBC 3/16 with Hgb 12.2. Recheck CBC. Contact information given for colonoscopy as he was previously referred in 2020.   DOE (dyspnea on exertion) Stable. 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 Continue Dulera 2 puffs BID. Follow up with pulmonology for PFTs. Repeat CT Chest in 6-8 weeks. Contact information provided in AVS. Referral to pulmonology placed.      Follow up with PCP for chronic back and knee pain. Continue magnesium supplementation as this has resolved leg cramping.   Lyndee Hensen, DO PGY-2, [facility] 10/16/2020

## 2020-10-16 NOTE — Assessment & Plan Note (Addendum)
Stable. 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 Continue Dulera 2 puffs BID. Follow up with pulmonology for PFTs. Repeat CT Chest in 6-8 weeks. Contact information provided in AVS. Referral to pulmonology placed.

## 2020-10-17 LAB — CBC
Hematocrit: 37.7 % (ref 37.5–51.0)
Hemoglobin: 11.8 g/dL — ABNORMAL LOW (ref 13.0–17.7)
MCH: 31.4 pg (ref 26.6–33.0)
MCHC: 31.3 g/dL — ABNORMAL LOW (ref 31.5–35.7)
MCV: 100 fL — ABNORMAL HIGH (ref 79–97)
Platelets: 273 10*3/uL (ref 150–450)
RBC: 3.76 x10E6/uL — ABNORMAL LOW (ref 4.14–5.80)
RDW: 12.8 % (ref 11.6–15.4)
WBC: 6.9 10*3/uL (ref 3.4–10.8)

## 2020-11-11 ENCOUNTER — Telehealth: Payer: Self-pay

## 2020-11-11 NOTE — Telephone Encounter (Signed)
Patient LVM on nurse line requesting a call back to discuss medication refills. I attempted to call him, however no answer or option for VM.

## 2020-12-06 ENCOUNTER — Telehealth: Payer: Self-pay | Admitting: *Deleted

## 2020-12-06 NOTE — Telephone Encounter (Signed)
Notes on file from Arthur Holms PCP at [facility], [phone]. Referral sent to scheduling.

## 2020-12-19 ENCOUNTER — Institutional Professional Consult (permissible substitution): Payer: Medicare Other | Admitting: Emergency Medicine

## 2020-12-19 ENCOUNTER — Institutional Professional Consult (permissible substitution): Payer: Medicare HMO | Admitting: Emergency Medicine

## 2020-12-19 ENCOUNTER — Other Ambulatory Visit: Payer: Self-pay

## 2020-12-20 ENCOUNTER — Telehealth: Payer: Self-pay | Admitting: *Deleted

## 2020-12-20 NOTE — Telephone Encounter (Signed)
REFERRAL ON FILE...CHART PREP/ MD

## 2020-12-23 ENCOUNTER — Telehealth: Payer: Self-pay

## 2020-12-23 NOTE — Telephone Encounter (Signed)
NOTES ON FILE FROM [facility] [phone] SENT REFERRAL TO SCHEDULING 

## 2021-01-17 ENCOUNTER — Telehealth: Payer: Self-pay | Admitting: Cardiovascular Disease

## 2021-01-17 NOTE — Telephone Encounter (Signed)
Spoke with patient who is requesting f/u appt for SOB with Dr Angelena Form.  He had been scheduled to be evaluated by pulmonary but got his appt confused and was never evaluated.  He is c/o having SOB with activity such as walking 3 to 4 blocks.  He admits to staying inside and laying around a lot still being out of the hospital.  He denies any other cardiac s/s - no chest pain.  Advised pt I will send this information to Dr Angelena Form and his nurse for them to decide when they can work him into their schedule.   Pt went on to tell me this office has a problem if he can't be seen until October and if that is the case, he will just go somewhere else.   Advised pt again I will have Dr Angelena Form and his nurse to review and he will be contacted to be scheduled. He continued on to shout at me that we should "just hire people that can get the job done"  "hire people that are half way done with medical school so they can see patients", etc.  Pt continue to shout and I disconnected the call.

## 2021-01-17 NOTE — Telephone Encounter (Signed)
Pt c/o Shortness Of Breath: STAT if SOB developed within the last 24 hours or pt is noticeably SOB on the phone  1. Are you currently SOB (can you hear that pt is SOB on the phone)?  No not right now   2. How long have you been experiencing SOB?  Pt was in the hosp at the beginning at the year for 9 weeks.  He has had sob since the 1st of the year and has not fu with Cardiology.    3. Are you SOB when sitting or when up moving around? When he is up moving around   4. Are you currently experiencing any other symptoms? Pt denies any other symptoms   Best number [phone]  Next able appt is Oct and pt stated he needs to fu sooner in the that.   Pt is upset he can not get a sooner appt

## 2021-01-22 NOTE — Telephone Encounter (Signed)
The was notified of reply from Dr. Angelena Form:  I met this patient in the hospital in January 2022. I have not seen him in the office. He has a history of behavioral/psychiatric issues. He had an extensive cardiac workup in the hospital. He will need to follow up with his primary care doctor to discuss his dyspnea and see a pulmonologist. If he wishes to see someone in our office we can set him up with an office APP or put him on a DOD schedule. Or he can go elsewhere for his cardiac care if he chooses. Anthony [name] ____________________________________ The patient denies chest pain and no changes in the SOB that he has been experiencing for several months.  He refuses to wait until October for an appointment and he will go elsewhere for his care if needed but would still like a sooner appointment than October.  I adv that I would send a message to scheduling to try to facilitate an appointment with an office APP.  As these are unchanged, ongoing symptoms, they do not meet DOD schedule add on. Also adv that pt should see PCP/pulmonary as soon as possible to consider further work up of SOB as Dr. Angelena Form recommends.  He tells me he has an appointment with PCP tomorrow.  The pt is appreciative for information/assistance.

## 2021-02-07 ENCOUNTER — Ambulatory Visit (HOSPITAL_BASED_OUTPATIENT_CLINIC_OR_DEPARTMENT_OTHER): Payer: Medicare HMO | Admitting: Family

## 2021-02-07 NOTE — Progress Notes (Deleted)
Office Visit    Patient Name: Anthony [name] Date of Encounter: 02/07/2021  PCP:  Carollee Leitz, [facility]  Cardiologist:  Lauree Chandler, MD  Advanced Practice Provider:  No care team member to display Electrophysiologist:  None   Chief Complaint    Anthony [name] is a 65 y.o. male with a hx of atrial fibrillation on Xarelto, *** presents today for ***   Past Medical History    Past Medical History:  Diagnosis Date   Actinic keratosis 11/27/2016   Alcohol abuse with alcohol-induced mood disorder ([place]) 07/18/2018   Alcohol use disorder, severe, dependence ([place]) 09/16/2006   05/22/2015 Argumentative, belligerent and verbally abusive to staff per his note from [facility])    Anxiety state 04/25/2018   Chronic low back pain 09/16/2006   Followed by NS.  Per his chart from [place], he fell off two storyhouse roof while cleaning his brother's gutters and sustained compression fracture of L3, L4 and L5 and fracture of right transverse process at L3 and nondisplaced fracture of left glenoid rim many years ago. He had multiple imaging in [place] including Lumbar MRI in 2016 which showed moderate to severe spinal canal stenos   Delirium tremens ([place]) 09/01/2017   Depression    Dry eye 11/23/2016   Hiatal hernia 09/14/2016   Status Collis gastroplasty and Nissen's fundoplication on 123XX123 in [place]   History of delirium tremons 07/22/2020   DTs during 10/2016 08/2017. And 12/2017 admissions    History of pulmonary embolus (PE) 11/02/2016   Unprovoked 11/01/2016. Xarelto from 11/01/16 to 09/08/2017.   Hydrocele    Surgically corrected   Hypertension    Hypoalbuminemia due to protein-calorie malnutrition ([place]) 07/22/2020   Lumbar compression fracture (HCC)    Multiple rib fractures 07/22/2019   Left rib details XR: left ribs demonstrate multiple remote rib   Pancytopenia ([place])    Rhabdomyolysis 07/22/2020   Skin cancer     exciced 2017   Tinea versicolor 06/08/2013   Tobacco use disorder 07/22/2020   Tooth infection 04/25/2018   Trigger finger, acquired 11/18/2012   Ulnar tunnel syndrome of right wrist 11/08/2012   Past Surgical History:  Procedure Laterality Date   CATARACT EXTRACTION     HIATAL HERNIA REPAIR  2016   HYDROCELE EXCISION / REPAIR  2010   SKIN CANCER EXCISION Left    Excised 2017    Allergies  Not on File  History of Present Illness    Anthony [name] is a 65 y.o. male with a hx of *** last seen ***.   EKGs/Labs/Other Studies Reviewed:   The following studies were reviewed today: ***  EKG:  EKG is *** ordered today.  The ekg ordered today demonstrates ***  Recent Labs: 07/23/2020: TSH 2.849 08/18/2020: ALT 41 09/29/2020: B Natriuretic Peptide 252.8 10/07/2020: Magnesium 1.7 10/08/2020: BUN 15; Creatinine, Ser 0.98; Potassium 4.2; Sodium 139 10/16/2020: Hemoglobin 11.8; Platelets 273  Recent Lipid Panel    Component Value Date/Time   CHOL 164 10/05/2020 0033   TRIG 125 10/05/2020 0033   HDL 52 10/05/2020 0033   CHOLHDL 3.2 10/05/2020 0033   VLDL 25 10/05/2020 0033   LDLCALC 87 10/05/2020 0033    Risk Assessment/Calculations:  {Does this patient have ATRIAL FIBRILLATION?[phone]}  Home Medications   No outpatient medications have been marked as taking for the 02/07/21 encounter (Appointment) with Loel Dubonnet, NP.     Review of Systems   ***  All other systems reviewed and are otherwise negative except as noted above.  Physical Exam    VS:  There were no vitals taken for this visit. , BMI There is no height or weight on file to calculate BMI.  Wt Readings from Last 3 Encounters:  10/16/20 161 lb 12.8 oz (73.4 kg)  10/09/20 159 lb 13.3 oz (72.5 kg)  08/09/20 138 lb 9.6 oz (62.9 kg)     GEN: Well nourished, well developed, in no acute distress. HEENT: normal. Neck: Supple, no JVD, carotid bruits, or masses. Cardiac: ***RRR, no murmurs, rubs, or gallops. No  clubbing, cyanosis, edema.  ***Radials/PT 2+ and equal bilaterally.  Respiratory:  ***Respirations regular and unlabored, clear to auscultation bilaterally. GI: Soft, nontender, nondistended. MS: No deformity or atrophy. Skin: Warm and dry, no rash. Neuro:  Strength and sensation are intact. Psych: Normal affect.  Assessment & Plan    *** Atrial fibrillation/chronic anticoagulation -  Iron deficiency anemia -  DOE -   Disposition: Follow up {follow up:15908} with *** or APP.  Signed, Loel Dubonnet, NP 02/07/2021, 1:33 PM [facility]

## 2021-02-10 NOTE — Progress Notes (Deleted)
Date:  02/10/2021   ID:  Anthony [name], DOB September 28, 1955, MRN [id number]  PCP:  Carollee Leitz, MD  Cardiologist:  Rex Kras, DO, [facility] (established care 02/11/2021) Former Cardiology Providers: ***  REASON FOR CONSULT: Atherosclerotic heart disease of native coronary artery without angina pectoris  REQUESTING PHYSICIAN:  Carollee Leitz, MD [address],  [place] [address]  No chief complaint on file.   HPI  Anthony [name] is a 65 y.o. male who presents to the office with a chief complaint of "***." Patient's past medical history and cardiovascular risk factors include: hypertension, atrial fibrillation, ***  He is referred to the office at the request of Carollee Leitz, MD for evaluation of atherosclerotic heart disease of native coronary artery without angina pectoris.  ***  History of  Denies prior history of coronary artery disease, myocardial infarction, congestive heart failure, deep venous thrombosis, pulmonary embolism, stroke, transient ischemic attack.  FUNCTIONAL STATUS: ***   ALLERGIES: Not on File  MEDICATION LIST PRIOR TO VISIT: No outpatient medications have been marked as taking for the 02/11/21 encounter (Appointment) with Rex Kras, DO.     PAST MEDICAL HISTORY: Past Medical History:  Diagnosis Date   Actinic keratosis 11/27/2016   Alcohol abuse with alcohol-induced mood disorder ([place]) 07/18/2018   Alcohol use disorder, severe, dependence ([place]) 09/16/2006   05/22/2015 Argumentative, belligerent and verbally abusive to staff per his note from [facility])    Anxiety state 04/25/2018   Chronic low back pain 09/16/2006   Followed by NS.  Per his chart from [place], he fell off two storyhouse roof while cleaning his brother's gutters and sustained compression fracture of L3, L4 and L5 and fracture of right transverse process at L3 and nondisplaced fracture of left glenoid rim many years ago. He had multiple imaging in [place]  including Lumbar MRI in 2016 which showed moderate to severe spinal canal stenos   Delirium tremens ([place]) 09/01/2017   Depression    Dry eye 11/23/2016   Hiatal hernia 09/14/2016   Status Collis gastroplasty and Nissen's fundoplication on 29/52/8413 in [place]   History of delirium tremons 07/22/2020   DTs during 10/2016 08/2017. And 12/2017 admissions    History of pulmonary embolus (PE) 11/02/2016   Unprovoked 11/01/2016. Xarelto from 11/01/16 to 09/08/2017.   Hydrocele    Surgically corrected   Hypertension    Hypoalbuminemia due to protein-calorie malnutrition ([place]) 07/22/2020   Lumbar compression fracture (HCC)    Multiple rib fractures 07/22/2019   Left rib details XR: left ribs demonstrate multiple remote rib   Pancytopenia ([place])    Rhabdomyolysis 07/22/2020   Skin cancer    exciced 2017   Tinea versicolor 06/08/2013   Tobacco use disorder 07/22/2020   Tooth infection 04/25/2018   Trigger finger, acquired 11/18/2012   Ulnar tunnel syndrome of right wrist 11/08/2012    PAST SURGICAL HISTORY: Past Surgical History:  Procedure Laterality Date   CATARACT EXTRACTION     HIATAL HERNIA REPAIR  2016   HYDROCELE EXCISION / REPAIR  2010   SKIN CANCER EXCISION Left    Excised 2017    FAMILY HISTORY: The patient family history includes Cancer in his sister; Dementia in his father; Heart attack in his father; Stroke in his father.  SOCIAL HISTORY:  The patient  reports that he has never smoked. His smokeless tobacco use includes snuff. He reports current alcohol use of about 43.0 standard drinks of alcohol per week. He  reports current drug use. Drug: Marijuana.  REVIEW OF SYSTEMS: ROS  PHYSICAL EXAM: Vitals with BMI 10/16/2020 10/09/2020 10/08/2020  Height 5' 8" - -  Weight 161 lbs 13 oz 159 lbs 13 oz -  BMI 50.09 - -  Systolic 381 829 937  Diastolic 60 79 78  Pulse 95 79 79    CONSTITUTIONAL: Well-developed and well-nourished. No acute distress.  SKIN: Skin is warm and dry. No rash  noted. No cyanosis. No pallor. No jaundice HEAD: Normocephalic and atraumatic.  EYES: No scleral icterus MOUTH/THROAT: Moist oral membranes.  NECK: No JVD present. No thyromegaly noted. No carotid bruits  LYMPHATIC: No visible cervical adenopathy.  CHEST Normal respiratory effort. No intercostal retractions  LUNGS: *** No stridor. No wheezes. No rales.  CARDIOVASCULAR: *** ABDOMINAL: No apparent ascites.  EXTREMITIES: No peripheral edema  HEMATOLOGIC: No significant bruising NEUROLOGIC: Oriented to person, place, and time. Nonfocal. Normal muscle tone.  PSYCHIATRIC: Normal mood and affect. Normal behavior. Cooperative  CARDIAC DATABASE: EKG: 09/16/2020:  Echocardiogram: ECHO COMPLETE WITH IMAGING ENHANCING AGENT 07/26/2020  1. Left ventricular ejection fraction, by estimation, is 55 to 60%. The left ventricle has normal function. The left ventricle has no regional wall motion abnormalities. Left ventricular diastolic parameters are consistent with Grade II diastolic dysfunction (pseudonormalization). 2. Right ventricular systolic function is normal. The right ventricular size is normal. Tricuspid regurgitation signal is inadequate for assessing PA pressure. 3. The mitral valve is normal in structure. Trivial mitral valve regurgitation. No evidence of mitral stenosis. 4. The aortic valve is tricuspid. Aortic valve regurgitation is not visualized. No aortic stenosis is present. 5. Aortic dilatation noted. There is mild dilatation of the aortic root, measuring 39 mm. 6. The inferior vena cava is dilated in size with >50% respiratory variability, suggesting right atrial pressure of 8 mmHg.   Stress Testing: No results found for this or any previous visit from the past 1095 days.   Heart Catheterization: ***  LABORATORY DATA: CBC Latest Ref Rng & Units 10/16/2020 10/02/2020 09/24/2020  WBC 3.4 - 10.8 x10E3/uL 6.9 6.7 7.5  Hemoglobin 13.0 - 17.7 g/dL 11.8(L) 12.2(L) 9.5(L)  Hematocrit  37.5 - 51.0 % 37.7 36.7(L) 29.6(L)  Platelets 150 - 450 x10E3/uL 273 374 289    CMP Latest Ref Rng & Units 10/08/2020 10/05/2020 10/01/2020  Glucose 70 - 99 mg/dL 102(H) 97 91  BUN 8 - 23 mg/dL 15 16 25(H)  Creatinine 0.61 - 1.24 mg/dL 0.98 0.73 1.11  Sodium 135 - 145 mmol/L 139 137 137  Potassium 3.5 - 5.1 mmol/L 4.2 3.6 3.7  Chloride 98 - 111 mmol/L 110 108 104  CO2 22 - 32 mmol/L _0 Calcium 8.9 - 10.3 mg/dL 9.0 9.1 9.8  Total Protein 6.5 - 8.1 g/dL - - -  Total Bilirubin 0.3 - 1.2 mg/dL - - -  Alkaline Phos 38 - 126 U/L - - -  AST 15 - 41 U/L - - -  ALT 0 - 44 U/L - - -    Lipid Panel     Component Value Date/Time   CHOL 164 10/05/2020 0033   TRIG 125 10/05/2020 0033   HDL 52 10/05/2020 0033   CHOLHDL 3.2 10/05/2020 0033   VLDL 25 10/05/2020 0033   LDLCALC 87 10/05/2020 0033    No components found for: NTPROBNP No results for input(s): PROBNP in the last 8760 hours. Recent Labs    07/23/20 0533  TSH 2.849    BMP Recent Labs  10/01/20 1127 10/05/20 0728 10/08/20 0643  NA 137 137 139  K 3.7 3.6 4.2  CL 104 108 110  CO2 _0 GLUCOSE 91 97 102*  BUN 25* 16 15  CREATININE 1.11 0.73 0.98  CALCIUM 9.8 9.1 9.0  GFRNONAA >60 >60 >60    HEMOGLOBIN A1C Lab Results  Component Value Date   HGBA1C 5.2 11/01/2016   MPG 103 11/01/2016   External Labs:  Date Collected: 12/03/2020 , information obtained by *** Potassium: *** Creatinine *** mg/dL. eGFR: *** mL/min per 1.73 m Hemoglobin: 10.9 g/dL and hematocrit: 34.1 % Lipid profile: Total cholesterol *** , triglycerides *** , HDL *** , LDL *** AST: *** , ALT: *** , alkaline phosphatase: ***  Hemoglobin A1c: *** TSH: ***     IMPRESSION:  No diagnosis found.   RECOMMENDATIONS: Kemet A Latchford is a 65 y.o. male whose past medical history and cardiac risk factors include: ***   FINAL MEDICATION LIST END OF ENCOUNTER: No orders of the defined types were placed in this encounter.   There are no  discontinued medications.   Current Outpatient Medications:    amiodarone (PACERONE) 200 MG tablet, TAKE 1 TABLET (200 MG TOTAL) BY MOUTH DAILY., Disp: 30 tablet, Rfl: 0   Baclofen 5 MG TABS, TAKE 1 TABLET (5 MG) BY MOUTH THREE TIMES DAILY., Disp: 30 tablet, Rfl: 0   ferrous sulfate 325 (65 FE) MG tablet, Take 1 tablet (325 mg total) by mouth daily., Disp: 30 tablet, Rfl: 0   ferrous sulfate 325 (65 FE) MG tablet, TAKE 1 TABLET BY MOUTH TWO TIMES WEEKLY, Disp: 9 tablet, Rfl: 0   folic acid (FOLVITE) 1 MG tablet, TAKE 1 TABLET (1 MG TOTAL) BY MOUTH DAILY., Disp: 30 tablet, Rfl: 0   furosemide (LASIX) 20 MG tablet, TAKE 1 TABLET (20 MG TOTAL) BY MOUTH DAILY., Disp: 30 tablet, Rfl: 0   loratadine (CLARITIN) 10 MG tablet, TAKE 1 TABLET (10 MG TOTAL) BY MOUTH DAILY., Disp: 30 tablet, Rfl: 0   magnesium chloride (SLOW-MAG) 64 MG TBEC SR tablet, Take 1 tablet (64 mg total) by mouth daily., Disp: 30 tablet, Rfl: 0   Magnesium Chloride 535 (64 Mg) MG TBCR, TAKE 1 TABLET (64 MG TOTAL) BY MOUTH DAILY., Disp: 30 tablet, Rfl: 0   magnesium oxide (MAG-OX) 400 MG tablet, TAKE 1 TABLET (400 MG TOTAL) BY MOUTH 2 (TWO) TIMES DAILY., Disp: 120 tablet, Rfl: 0   magnesium oxide (MAG-OX) 400 MG tablet, TAKE 1 TABLET (400 MG TOTAL) BY MOUTH TWO TIMES DAILY., Disp: 60 tablet, Rfl: 0   metoprolol succinate (TOPROL-XL) 25 MG 24 hr tablet, TAKE 1/2 TABLET (12.5 MG TOTAL) BY MOUTH DAILY., Disp: 30 tablet, Rfl: 0   mometasone-formoterol (DULERA) 100-5 MCG/ACT AERO, INHALE 2 PUFFS INTO THE LUNGS TWO TIMES DAILY., Disp: 13 g, Rfl: 0   rivaroxaban (XARELTO) 20 MG TABS tablet, TAKE 1 TABLET (20 MG TOTAL) BY MOUTH DAILY WITH SUPPER., Disp: 30 tablet, Rfl: 0   rosuvastatin (CRESTOR) 10 MG tablet, TAKE 1 TABLET (10 MG TOTAL) BY MOUTH DAILY., Disp: 30 tablet, Rfl: 0   sertraline (ZOLOFT) 100 MG tablet, TAKE 1 TABLET (100 MG TOTAL) BY MOUTH DAILY., Disp: 30 tablet, Rfl: 0   thiamine 100 MG tablet, TAKE 1 TABLET (100 MG TOTAL) BY MOUTH  DAILY., Disp: 30 tablet, Rfl: 0  No orders of the defined types were placed in this encounter.   There are no Patient Instructions on file for this visit.   --  Continue cardiac medications as reconciled in final medication list. --No follow-ups on file. Or sooner if needed. --Continue follow-up with your primary care physician regarding the management of your other chronic comorbid conditions.  Patient's questions and concerns were addressed to his satisfaction. He voices understanding of the instructions provided during this encounter.   This note was created using a voice recognition software as a result there may be grammatical errors inadvertently enclosed that do not reflect the nature of this encounter. Every attempt is made to correct such errors.  Rex Kras, [place], [facility]  Pager: [phone] Office: [phone]

## 2021-02-11 ENCOUNTER — Ambulatory Visit: Payer: Self-pay | Admitting: Cardiology

## 2021-02-19 NOTE — Progress Notes (Deleted)
Date:  02/19/2021   ID:  Anthony [name], DOB 13-Jul-1956, MRN [id number]  PCP:  Carollee Leitz, MD  Cardiologist:  Rex Kras, DO, [facility]  (established care 02/20/2021) Former Cardiology Providers: ***  REASON FOR CONSULT: Atherosclerotic heart disease of native coronary artery without angina pectoris  REQUESTING PHYSICIAN:  Carollee Leitz, MD [address],  [place] [address]  No chief complaint on file.   HPI  Anthony [name] is a 65 y.o. male who presents to the office with a chief complaint of "***." Patient's past medical history and cardiovascular risk factors include: hypertension, atrial fibrillation, ***  He is referred to the office at the request of Carollee Leitz, MD for evaluation of atherosclerotic heart disease of native coronary artery without angina pectoris.  ***  History of  Denies prior history of coronary artery disease, myocardial infarction, congestive heart failure, deep venous thrombosis, pulmonary embolism, stroke, transient ischemic attack.  FUNCTIONAL STATUS: ***   ALLERGIES: Not on File  MEDICATION LIST PRIOR TO VISIT: No outpatient medications have been marked as taking for the 02/20/21 encounter (Appointment) with Rex Kras, DO.     PAST MEDICAL HISTORY: Past Medical History:  Diagnosis Date   Actinic keratosis 11/27/2016   Alcohol abuse with alcohol-induced mood disorder ([place]) 07/18/2018   Alcohol use disorder, severe, dependence ([place]) 09/16/2006   05/22/2015 Argumentative, belligerent and verbally abusive to staff per his note from [facility])    Anxiety state 04/25/2018   Chronic low back pain 09/16/2006   Followed by NS.  Per his chart from [place], he fell off two storyhouse roof while cleaning his brother's gutters and sustained compression fracture of L3, L4 and L5 and fracture of right transverse process at L3 and nondisplaced fracture of left glenoid rim many years ago. He had multiple imaging in [place]  including Lumbar MRI in 2016 which showed moderate to severe spinal canal stenos   Delirium tremens ([place]) 09/01/2017   Depression    Dry eye 11/23/2016   Hiatal hernia 09/14/2016   Status Collis gastroplasty and Nissen's fundoplication on 25/95/6387 in [place]   History of delirium tremons 07/22/2020   DTs during 10/2016 08/2017. And 12/2017 admissions    History of pulmonary embolus (PE) 11/02/2016   Unprovoked 11/01/2016. Xarelto from 11/01/16 to 09/08/2017.   Hydrocele    Surgically corrected   Hypertension    Hypoalbuminemia due to protein-calorie malnutrition ([place]) 07/22/2020   Lumbar compression fracture (HCC)    Multiple rib fractures 07/22/2019   Left rib details XR: left ribs demonstrate multiple remote rib   Pancytopenia ([place])    Rhabdomyolysis 07/22/2020   Skin cancer    exciced 2017   Tinea versicolor 06/08/2013   Tobacco use disorder 07/22/2020   Tooth infection 04/25/2018   Trigger finger, acquired 11/18/2012   Ulnar tunnel syndrome of right wrist 11/08/2012    PAST SURGICAL HISTORY: Past Surgical History:  Procedure Laterality Date   CATARACT EXTRACTION     HIATAL HERNIA REPAIR  2016   HYDROCELE EXCISION / REPAIR  2010   SKIN CANCER EXCISION Left    Excised 2017    FAMILY HISTORY: The patient family history includes Cancer in his sister; Dementia in his father; Heart attack in his father; Stroke in his father.  SOCIAL HISTORY:  The patient  reports that he has never smoked. His smokeless tobacco use includes snuff. He reports current alcohol use of about 43.0 standard drinks of alcohol per week. He  reports current drug use. Drug: Marijuana.  REVIEW OF SYSTEMS: ROS  PHYSICAL EXAM: Vitals with BMI 10/16/2020 10/09/2020 10/08/2020  Height 5' 8"  - -  Weight 161 lbs 13 oz 159 lbs 13 oz -  BMI 09.81 - -  Systolic 191 478 295  Diastolic 60 79 78  Pulse 95 79 79    CONSTITUTIONAL: Well-developed and well-nourished. No acute distress.  SKIN: Skin is warm and dry. No rash  noted. No cyanosis. No pallor. No jaundice HEAD: Normocephalic and atraumatic.  EYES: No scleral icterus MOUTH/THROAT: Moist oral membranes.  NECK: No JVD present. No thyromegaly noted. No carotid bruits  LYMPHATIC: No visible cervical adenopathy.  CHEST Normal respiratory effort. No intercostal retractions  LUNGS: *** No stridor. No wheezes. No rales.  CARDIOVASCULAR: *** ABDOMINAL: No apparent ascites.  EXTREMITIES: No peripheral edema  HEMATOLOGIC: No significant bruising NEUROLOGIC: Oriented to person, place, and time. Nonfocal. Normal muscle tone.  PSYCHIATRIC: Normal mood and affect. Normal behavior. Cooperative  CARDIAC DATABASE: EKG: ***  Echocardiogram: ECHO COMPLETE WITH IMAGING ENHANCING AGENT 07/26/2020  1. Left ventricular ejection fraction, by estimation, is 55 to 60%. The left ventricle has normal function. The left ventricle has no regional wall motion abnormalities. Left ventricular diastolic parameters are consistent with Grade II diastolic dysfunction (pseudonormalization). 2. Right ventricular systolic function is normal. The right ventricular size is normal. Tricuspid regurgitation signal is inadequate for assessing PA pressure. 3. The mitral valve is normal in structure. Trivial mitral valve regurgitation. No evidence of mitral stenosis. 4. The aortic valve is tricuspid. Aortic valve regurgitation is not visualized. No aortic stenosis is present. 5. Aortic dilatation noted. There is mild dilatation of the aortic root, measuring 39 mm. 6. The inferior vena cava is dilated in size with >50% respiratory variability, suggesting right atrial pressure of 8 mmHg.    Stress Testing: No results found for this or any previous visit from the past 1095 days.   Heart Catheterization: ***  LABORATORY DATA: CBC Latest Ref Rng & Units 10/16/2020 10/02/2020 09/24/2020  WBC 3.4 - 10.8 x10E3/uL 6.9 6.7 7.5  Hemoglobin 13.0 - 17.7 g/dL 11.8(L) 12.2(L) 9.5(L)  Hematocrit 37.5 -  51.0 % 37.7 36.7(L) 29.6(L)  Platelets 150 - 450 x10E3/uL 273 374 289    CMP Latest Ref Rng & Units 10/08/2020 10/05/2020 10/01/2020  Glucose 70 - 99 mg/dL 102(H) 97 91  BUN 8 - 23 mg/dL 15 16 25(H)  Creatinine 0.61 - 1.24 mg/dL 0.98 0.73 1.11  Sodium 135 - 145 mmol/L 139 137 137  Potassium 3.5 - 5.1 mmol/L 4.2 3.6 3.7  Chloride 98 - 111 mmol/L 110 108 104  CO2 22 - 32 mmol/L 24 22 25   Calcium 8.9 - 10.3 mg/dL 9.0 9.1 9.8  Total Protein 6.5 - 8.1 g/dL - - -  Total Bilirubin 0.3 - 1.2 mg/dL - - -  Alkaline Phos 38 - 126 U/L - - -  AST 15 - 41 U/L - - -  ALT 0 - 44 U/L - - -    Lipid Panel     Component Value Date/Time   CHOL 164 10/05/2020 0033   TRIG 125 10/05/2020 0033   HDL 52 10/05/2020 0033   CHOLHDL 3.2 10/05/2020 0033   VLDL 25 10/05/2020 0033   LDLCALC 87 10/05/2020 0033    No components found for: NTPROBNP No results for input(s): PROBNP in the last 8760 hours. Recent Labs    07/23/20 0533  TSH 2.849    BMP Recent Labs  10/01/20 1127 10/05/20 0728 10/08/20 0643  NA 137 137 139  K 3.7 3.6 4.2  CL 104 108 110  CO2 25 22 24   GLUCOSE 91 97 102*  BUN 25* 16 15  CREATININE 1.11 0.73 0.98  CALCIUM 9.8 9.1 9.0  GFRNONAA >60 >60 >60    HEMOGLOBIN A1C Lab Results  Component Value Date   HGBA1C 5.2 11/01/2016   MPG 103 11/01/2016   External Labs:  Date Collected: 12/03/2020 , information obtained by *** Potassium: *** Creatinine *** mg/dL. eGFR: *** mL/min per 1.73 m Hemoglobin: 10.9 g/dL and hematocrit: 34.1 % Lipid profile: Total cholesterol *** , triglycerides *** , HDL *** , LDL *** AST: *** , ALT: *** , alkaline phosphatase: ***  Hemoglobin A1c: *** TSH: ***    IMPRESSION:  No diagnosis found.   RECOMMENDATIONS: Harun A Baham is a 65 y.o. male whose past medical history and cardiac risk factors include: ***   FINAL MEDICATION LIST END OF ENCOUNTER: No orders of the defined types were placed in this encounter.   There are no  discontinued medications.   Current Outpatient Medications:    amiodarone (PACERONE) 200 MG tablet, TAKE 1 TABLET (200 MG TOTAL) BY MOUTH DAILY., Disp: 30 tablet, Rfl: 0   Baclofen 5 MG TABS, TAKE 1 TABLET (5 MG) BY MOUTH THREE TIMES DAILY., Disp: 30 tablet, Rfl: 0   ferrous sulfate 325 (65 FE) MG tablet, Take 1 tablet (325 mg total) by mouth daily., Disp: 30 tablet, Rfl: 0   ferrous sulfate 325 (65 FE) MG tablet, TAKE 1 TABLET BY MOUTH TWO TIMES WEEKLY, Disp: 9 tablet, Rfl: 0   folic acid (FOLVITE) 1 MG tablet, TAKE 1 TABLET (1 MG TOTAL) BY MOUTH DAILY., Disp: 30 tablet, Rfl: 0   furosemide (LASIX) 20 MG tablet, TAKE 1 TABLET (20 MG TOTAL) BY MOUTH DAILY., Disp: 30 tablet, Rfl: 0   loratadine (CLARITIN) 10 MG tablet, TAKE 1 TABLET (10 MG TOTAL) BY MOUTH DAILY., Disp: 30 tablet, Rfl: 0   magnesium chloride (SLOW-MAG) 64 MG TBEC SR tablet, Take 1 tablet (64 mg total) by mouth daily., Disp: 30 tablet, Rfl: 0   Magnesium Chloride 535 (64 Mg) MG TBCR, TAKE 1 TABLET (64 MG TOTAL) BY MOUTH DAILY., Disp: 30 tablet, Rfl: 0   magnesium oxide (MAG-OX) 400 MG tablet, TAKE 1 TABLET (400 MG TOTAL) BY MOUTH 2 (TWO) TIMES DAILY., Disp: 120 tablet, Rfl: 0   magnesium oxide (MAG-OX) 400 MG tablet, TAKE 1 TABLET (400 MG TOTAL) BY MOUTH TWO TIMES DAILY., Disp: 60 tablet, Rfl: 0   metoprolol succinate (TOPROL-XL) 25 MG 24 hr tablet, TAKE 1/2 TABLET (12.5 MG TOTAL) BY MOUTH DAILY., Disp: 30 tablet, Rfl: 0   mometasone-formoterol (DULERA) 100-5 MCG/ACT AERO, INHALE 2 PUFFS INTO THE LUNGS TWO TIMES DAILY., Disp: 13 g, Rfl: 0   rivaroxaban (XARELTO) 20 MG TABS tablet, TAKE 1 TABLET (20 MG TOTAL) BY MOUTH DAILY WITH SUPPER., Disp: 30 tablet, Rfl: 0   rosuvastatin (CRESTOR) 10 MG tablet, TAKE 1 TABLET (10 MG TOTAL) BY MOUTH DAILY., Disp: 30 tablet, Rfl: 0   sertraline (ZOLOFT) 100 MG tablet, TAKE 1 TABLET (100 MG TOTAL) BY MOUTH DAILY., Disp: 30 tablet, Rfl: 0   thiamine 100 MG tablet, TAKE 1 TABLET (100 MG TOTAL) BY MOUTH  DAILY., Disp: 30 tablet, Rfl: 0  No orders of the defined types were placed in this encounter.   There are no Patient Instructions on file for this visit.   --Continue  cardiac medications as reconciled in final medication list. --No follow-ups on file. Or sooner if needed. --Continue follow-up with your primary care physician regarding the management of your other chronic comorbid conditions.  Patient's questions and concerns were addressed to his satisfaction. He voices understanding of the instructions provided during this encounter.   This note was created using a voice recognition software as a result there may be grammatical errors inadvertently enclosed that do not reflect the nature of this encounter. Every attempt is made to correct such errors.  Rex Kras, [place], [facility]  Pager: [phone] Office: [phone]

## 2021-02-20 ENCOUNTER — Ambulatory Visit: Payer: Self-pay | Admitting: Cardiology

## 2021-03-13 ENCOUNTER — Other Ambulatory Visit: Payer: Self-pay

## 2021-03-13 ENCOUNTER — Encounter: Payer: Self-pay | Admitting: Pulmonary Disease

## 2021-03-13 ENCOUNTER — Ambulatory Visit (INDEPENDENT_AMBULATORY_CARE_PROVIDER_SITE_OTHER): Payer: Medicare Other | Admitting: Pulmonary Disease

## 2021-03-13 VITALS — BP 144/90 | HR 65 | Temp 97.6°F | Ht 70.0 in | Wt 157.8 lb

## 2021-03-13 DIAGNOSIS — R0600 Dyspnea, unspecified: Secondary | ICD-10-CM

## 2021-03-13 DIAGNOSIS — J181 Lobar pneumonia, unspecified organism: Secondary | ICD-10-CM

## 2021-03-13 DIAGNOSIS — R0609 Other forms of dyspnea: Secondary | ICD-10-CM

## 2021-03-13 NOTE — Patient Instructions (Signed)
Nice to meet you  I recommend to get pulmonary function test to further evaluate your shortness of breath.  I think it was related to your heart but we can see if there is anything else we can improve upon.  We may need to increase your inhaler in the future.  To check on the spot in the left upper part of your lung that was there during your hospitalization, I recommend a repeat CT scan at your convenience in the coming days to weeks.  Return to clinic in 3 months or sooner as needed

## 2021-03-13 NOTE — Progress Notes (Signed)
$'@Patient'C$  ID: Anthony [name], male    DOB: 02-Aug-1955, 65 y.o.   MRN: [id number]  Chief Complaint  Patient presents with   Consult    Pt states he is having breathing problems he is having SOB and dizziness.    Referring provider: Carollee Leitz, MD  HPI:   65 y.o. man whom we are seeing in consultation for evaluation of shortness of breath, abnormal CT scan.  Most recent PCP note reviewed.  Discharge summary 09/2020 reviewed.  Pulmonary consult note 09/2020 reviewed.  Patient was hospitalized for nearly 2 months 1/22 to 3/22.  Seems like difficult placement, home as of the time.   Seems like endorse ongoing dyspnea on exertion to his inpatient team during 40-monthhospitalization.  This prompted a chest x-ray 3/22 my interpretation showed infiltrate left upper lung field.  For unclear reasons, this triggered a CT scan by the primary team.  This showed similar findings that in addition to my interpretation with left upper lobe groundglass opacity, bronchial thickening throughout, bibasilar bronchiectasis.  Pulm was consulted who recommended ongoing treatment for And repeat imaging in about 6 weeks.  This was never done.  He had a CT coronary scan 10/02/2020 on my tradition shows similar-appearing left upper lobe infiltrate.  In addition he had significant coronary disease on the scan.  Today, he has difficulty given clear history.  He endorses ongoing alcohol use.  Asked if he had alcohol to drink that he said yes.  But only "1 shot."  He is tangential and difficult to give answers to questions.  He endorses a longstanding history of dyspnea exertion for at least 5 years.  He was put on Dulera during hospitalization 09/2020.  He thinks this helps a [place] bit.  He feels that he does not have much energy, cannot do much around the apartment.  He reports recently moving to a new apartment and moving was quite difficult on him.  It is hard for him to give any specific timeline or more specific details in  regards to his dyspnea.  No clear time during the day when he is better or worse..  No seasonal environmental changes that we can identify.  Again DRuthe Mannanis helped some but no other medications seem to help.  PMH: Hypertension, atrial fibrillation Surgical history: Back surgery Family history: Heart disease in father Social history: never smoker, drinks at least a six pack a day but L[place] bit later admits drinking liquor this morning prior to the visit  ACT:  No flowsheet data found.  MMRC: No flowsheet data found.  Epworth:  No flowsheet data found.  Tests:   FENO:  No results found for: NITRICOXIDE  PFT: No flowsheet data found.  WALK:  No flowsheet data found.  Imaging: Personally reviewed as per EMR discussion this note  Lab Results: Personally reviewed CBC    Component Value Date/Time   WBC 6.9 10/16/2020 1203   WBC 6.7 10/02/2020 0212   RBC 3.76 (L) 10/16/2020 1203   RBC 3.74 (L) 10/02/2020 0212   HGB 11.8 (L) 10/16/2020 1203   HCT 37.7 10/16/2020 1203   PLT 273 10/16/2020 1203   MCV 100 (H) 10/16/2020 1203   MCH 31.4 10/16/2020 1203   MCH 32.6 10/02/2020 0212   MCHC 31.3 (L) 10/16/2020 1203   MCHC 33.2 10/02/2020 0212   RDW 12.8 10/16/2020 1203   LYMPHSABS 2.5 09/24/2020 0010   LYMPHSABS 2.2 11/27/2016 1509   MONOABS 0.7 09/24/2020 0010   EOSABS 0.5 09/24/2020  0010   EOSABS 0.4 11/27/2016 1509   BASOSABS 0.1 09/24/2020 0010   BASOSABS 0.1 11/27/2016 1509    BMET    Component Value Date/Time   NA 139 10/08/2020 0643   NA 138 11/27/2016 1509   K 4.2 10/08/2020 0643   CL 110 10/08/2020 0643   CO2 24 10/08/2020 0643   GLUCOSE 102 (H) 10/08/2020 0643   BUN 15 10/08/2020 0643   BUN 9 11/27/2016 1509   CREATININE 0.98 10/08/2020 0643   CREATININE 0.78 08/10/2013 1630   CALCIUM 9.0 10/08/2020 0643   GFRNONAA >60 10/08/2020 0643   GFRAA >60 07/20/2018 0524    BNP    Component Value Date/Time   BNP 252.8 (H) 09/29/2020 1558    ProBNP No  results found for: PROBNP  Specialty Problems       Pulmonary Problems   DOE (dyspnea on exertion)   Cough    Not on File  Immunization History  Administered Date(s) Administered   Fluad Quad(high Dose 65+) 09/12/2020   Influenza,inj,Quad PF,6+ Mos 05/23/2013, 03/18/2017, 04/15/2017, 04/22/2018, 04/18/2019, 08/05/2020   Moderna SARS-COV2 Booster Vaccination 07/26/2020   PFIZER(Purple Top)SARS-COV-2 Vaccination 12/08/2019, 01/04/2020   Pneumococcal Polysaccharide-23 12/18/2016    Past Medical History:  Diagnosis Date   Actinic keratosis 11/27/2016   Alcohol abuse with alcohol-induced mood disorder ([place]) 07/18/2018   Alcohol use disorder, severe, dependence ([place]) 09/16/2006   05/22/2015 Argumentative, belligerent and verbally abusive to staff per his note from [facility])    Anxiety state 04/25/2018   Chronic low back pain 09/16/2006   Followed by NS.  Per his chart from [place], he fell off two storyhouse roof while cleaning his brother's gutters and sustained compression fracture of L3, L4 and L5 and fracture of right transverse process at L3 and nondisplaced fracture of left glenoid rim many years ago. He had multiple imaging in [place] including Lumbar MRI in 2016 which showed moderate to severe spinal canal stenos   Delirium tremens ([place]) 09/01/2017   Depression    Dry eye 11/23/2016   Hiatal hernia 09/14/2016   Status Collis gastroplasty and Nissen's fundoplication on [id number] in [place]   History of delirium tremons 07/22/2020   DTs during 10/2016 08/2017. And 12/2017 admissions    History of pulmonary embolus (PE) 11/02/2016   Unprovoked 11/01/2016. Xarelto from 11/01/16 to 09/08/2017.   Hydrocele    Surgically corrected   Hypertension    Hypoalbuminemia due to protein-calorie malnutrition ([place]) 07/22/2020   Lumbar compression fracture ([facility])    Multiple rib fractures 07/22/2019   Left rib details XR: left ribs demonstrate multiple remote rib    Pancytopenia ([place])    Rhabdomyolysis 07/22/2020   Skin cancer    exciced 2017   Tinea versicolor 06/08/2013   Tobacco use disorder 07/22/2020   Tooth infection 04/25/2018   Trigger finger, acquired 11/18/2012   Ulnar tunnel syndrome of right wrist 11/08/2012    Tobacco History: Social History   Tobacco Use  Smoking Status Never  Smokeless Tobacco Current   Types: Snuff  Tobacco Comments   Pt states do snuff when stressed had 1 can last 3 month//PL   Ready to quit: Not Answered Counseling given: Not Answered Tobacco comments: Pt states do snuff when stressed had 1 can last 3 month//PL   Continue to not smoke  Outpatient Encounter Medications as of 03/13/2021  Medication Sig   ferrous sulfate 325 (65 FE) MG tablet Take 1 tablet (325 mg total) by mouth daily.  furosemide (LASIX) 20 MG tablet TAKE 1 TABLET (20 MG TOTAL) BY MOUTH DAILY.   loratadine (CLARITIN) 10 MG tablet TAKE 1 TABLET (10 MG TOTAL) BY MOUTH DAILY.   mometasone-formoterol (DULERA) 100-5 MCG/ACT AERO INHALE 2 PUFFS INTO THE LUNGS TWO TIMES DAILY.   sertraline (ZOLOFT) 100 MG tablet TAKE 1 TABLET (100 MG TOTAL) BY MOUTH DAILY.   thiamine 100 MG tablet TAKE 1 TABLET (100 MG TOTAL) BY MOUTH DAILY.   amiodarone (PACERONE) 200 MG tablet TAKE 1 TABLET (200 MG TOTAL) BY MOUTH DAILY. (Patient not taking: Reported on 03/13/2021)   Baclofen 5 MG TABS TAKE 1 TABLET (5 MG) BY MOUTH THREE TIMES DAILY. (Patient not taking: Reported on 03/13/2021)   ferrous sulfate 325 (65 FE) MG tablet TAKE 1 TABLET BY MOUTH TWO TIMES WEEKLY (Patient not taking: Reported on 0000000)   folic acid (FOLVITE) 1 MG tablet TAKE 1 TABLET (1 MG TOTAL) BY MOUTH DAILY. (Patient not taking: Reported on 03/13/2021)   magnesium chloride (SLOW-MAG) 64 MG TBEC SR tablet Take 1 tablet (64 mg total) by mouth daily. (Patient not taking: Reported on 03/13/2021)   Magnesium Chloride 535 (64 Mg) MG TBCR TAKE 1 TABLET (64 MG TOTAL) BY MOUTH DAILY. (Patient not taking:  Reported on 03/13/2021)   magnesium oxide (MAG-OX) 400 MG tablet TAKE 1 TABLET (400 MG TOTAL) BY MOUTH 2 (TWO) TIMES DAILY. (Patient not taking: Reported on 03/13/2021)   magnesium oxide (MAG-OX) 400 MG tablet TAKE 1 TABLET (400 MG TOTAL) BY MOUTH TWO TIMES DAILY. (Patient not taking: Reported on 03/13/2021)   metoprolol succinate (TOPROL-XL) 25 MG 24 hr tablet TAKE 1/2 TABLET (12.5 MG TOTAL) BY MOUTH DAILY. (Patient not taking: Reported on 03/13/2021)   rivaroxaban (XARELTO) 20 MG TABS tablet TAKE 1 TABLET (20 MG TOTAL) BY MOUTH DAILY WITH SUPPER. (Patient not taking: Reported on 03/13/2021)   rosuvastatin (CRESTOR) 10 MG tablet TAKE 1 TABLET (10 MG TOTAL) BY MOUTH DAILY. (Patient not taking: Reported on 03/13/2021)   No facility-administered encounter medications on file as of 03/13/2021.     Review of Systems  Review of Systems  Unable to ascertain as patient is tangential when trying to perform review of systems. Physical Exam  BP (!) 144/90 (BP Location: Left Arm, Patient Position: Sitting, Cuff Size: Normal)   Pulse 65   Temp 97.6 F (36.4 C) (Oral)   Ht '5\' 10"'$  (1.778 m)   Wt 157 lb 12.8 oz (71.6 kg)   SpO2 98%   BMI 22.64 kg/m   Wt Readings from Last 5 Encounters:  03/13/21 157 lb 12.8 oz (71.6 kg)  10/16/20 161 lb 12.8 oz (73.4 kg)  10/09/20 159 lb 13.3 oz (72.5 kg)  08/09/20 138 lb 9.6 oz (62.9 kg)  05/17/19 168 lb (76.2 kg)    BMI Readings from Last 5 Encounters:  03/13/21 22.64 kg/m  10/16/20 24.60 kg/m  10/09/20 24.30 kg/m  08/09/20 21.07 kg/m  05/17/19 25.54 kg/m     Physical Exam General: Sitting in chair, no acute distress Eyes: EOMI, no icterus Neck: Supple, no JVP Cardiovascular: Warm, trace pitting edema to lower shin Pulmonary: Clear to auscultation, no wheeze, normal work of breathing on room air Abdomen: Nondistended, bowel sounds present MSK: No synovitis, joint effusion Neuro: No weakness, sensation intact Psych: Tangential, easily  irritated   Assessment & Plan:   Dyspnea exertion: Likely multifactorial and related to degree of deconditioning given prolonged hospitalization earlier in the year, asthma given thickened bronchial seen on CT, potential contribution  of mild bronchiectasis, concern for coronary disease given significant calcification on CT coronary although FFR appears okay, likely transient pulmonary edema given his significant left-sided disease and severely dilated LA likely due to alcohol use. -- PFTs for further evaluation --Continue mid dose Dulera, consider increasing in the future --Recommend he reestablish care with cardiology  Pneumonia: Had cough and left upper lobe infiltrate on CT and chest x-ray 09/2020.  From what I can ascertain the cough is improved significantly.  He has difficulty answering questions.  We will repeat CT scan to evaluate for resolution.  Low suspicion for organized pneumonia given reported symptom improvement   Return in about 3 months (around 06/13/2021).   Lanier Clam, MD 03/13/2021

## 2021-03-29 ENCOUNTER — Other Ambulatory Visit: Payer: Medicare Other

## 2021-05-18 ENCOUNTER — Encounter (HOSPITAL_COMMUNITY): Payer: Self-pay | Admitting: Emergency Medicine

## 2021-05-18 ENCOUNTER — Other Ambulatory Visit: Payer: Self-pay

## 2021-05-18 ENCOUNTER — Emergency Department (HOSPITAL_COMMUNITY): Payer: Medicare Other

## 2021-05-18 ENCOUNTER — Emergency Department (HOSPITAL_COMMUNITY)
Admission: EM | Admit: 2021-05-18 | Discharge: 2021-05-19 | Disposition: A | Discharge status: Home / Self Care | Payer: Medicare Other | Attending: Emergency Medicine | Admitting: Emergency Medicine

## 2021-05-18 DIAGNOSIS — Y901 Blood alcohol level of 20-39 mg/100 ml: Secondary | ICD-10-CM | POA: Insufficient documentation

## 2021-05-18 DIAGNOSIS — I10 Essential (primary) hypertension: Secondary | ICD-10-CM | POA: Insufficient documentation

## 2021-05-18 DIAGNOSIS — S7011XA Contusion of right thigh, initial encounter: Secondary | ICD-10-CM | POA: Insufficient documentation

## 2021-05-18 DIAGNOSIS — E876 Hypokalemia: Secondary | ICD-10-CM | POA: Diagnosis not present

## 2021-05-18 DIAGNOSIS — F17290 Nicotine dependence, other tobacco product, uncomplicated: Secondary | ICD-10-CM | POA: Insufficient documentation

## 2021-05-18 DIAGNOSIS — E871 Hypo-osmolality and hyponatremia: Secondary | ICD-10-CM | POA: Insufficient documentation

## 2021-05-18 DIAGNOSIS — Z79899 Other long term (current) drug therapy: Secondary | ICD-10-CM | POA: Insufficient documentation

## 2021-05-18 DIAGNOSIS — F1020 Alcohol dependence, uncomplicated: Secondary | ICD-10-CM | POA: Diagnosis not present

## 2021-05-18 DIAGNOSIS — H538 Other visual disturbances: Secondary | ICD-10-CM | POA: Diagnosis not present

## 2021-05-18 DIAGNOSIS — Z7901 Long term (current) use of anticoagulants: Secondary | ICD-10-CM | POA: Insufficient documentation

## 2021-05-18 DIAGNOSIS — W19XXXA Unspecified fall, initial encounter: Secondary | ICD-10-CM | POA: Diagnosis not present

## 2021-05-18 DIAGNOSIS — Z85828 Personal history of other malignant neoplasm of skin: Secondary | ICD-10-CM | POA: Diagnosis not present

## 2021-05-18 DIAGNOSIS — S300XXA Contusion of lower back and pelvis, initial encounter: Secondary | ICD-10-CM | POA: Insufficient documentation

## 2021-05-18 DIAGNOSIS — G8929 Other chronic pain: Secondary | ICD-10-CM

## 2021-05-18 DIAGNOSIS — D649 Anemia, unspecified: Secondary | ICD-10-CM | POA: Insufficient documentation

## 2021-05-18 DIAGNOSIS — S3992XA Unspecified injury of lower back, initial encounter: Secondary | ICD-10-CM | POA: Diagnosis present

## 2021-05-18 LAB — CBC WITH DIFFERENTIAL/PLATELET
Abs Immature Granulocytes: 0.01 10*3/uL (ref 0.00–0.07)
Basophils Absolute: 0 10*3/uL (ref 0.0–0.1)
Basophils Relative: 1 %
Eosinophils Absolute: 0.2 10*3/uL (ref 0.0–0.5)
Eosinophils Relative: 4 %
HCT: 28 % — ABNORMAL LOW (ref 39.0–52.0)
Hemoglobin: 8.7 g/dL — ABNORMAL LOW (ref 13.0–17.0)
Immature Granulocytes: 0 %
Lymphocytes Relative: 26 %
Lymphs Abs: 1.1 10*3/uL (ref 0.7–4.0)
MCH: 32.7 pg (ref 26.0–34.0)
MCHC: 31.1 g/dL (ref 30.0–36.0)
MCV: 105.3 fL — ABNORMAL HIGH (ref 80.0–100.0)
Monocytes Absolute: 0.8 10*3/uL (ref 0.1–1.0)
Monocytes Relative: 18 %
Neutro Abs: 2.2 10*3/uL (ref 1.7–7.7)
Neutrophils Relative %: 51 %
Platelets: 273 10*3/uL (ref 150–400)
RBC: 2.66 MIL/uL — ABNORMAL LOW (ref 4.22–5.81)
RDW: 18.8 % — ABNORMAL HIGH (ref 11.5–15.5)
WBC: 4.4 10*3/uL (ref 4.0–10.5)
nRBC: 0 % (ref 0.0–0.2)

## 2021-05-18 LAB — COMPREHENSIVE METABOLIC PANEL
ALT: 59 U/L — ABNORMAL HIGH (ref 0–44)
AST: 138 U/L — ABNORMAL HIGH (ref 15–41)
Albumin: 3.1 g/dL — ABNORMAL LOW (ref 3.5–5.0)
Alkaline Phosphatase: 67 U/L (ref 38–126)
Anion gap: 11 (ref 5–15)
BUN: 12 mg/dL (ref 8–23)
CO2: 17 mmol/L — ABNORMAL LOW (ref 22–32)
Calcium: 8.3 mg/dL — ABNORMAL LOW (ref 8.9–10.3)
Chloride: 106 mmol/L (ref 98–111)
Creatinine, Ser: 0.94 mg/dL (ref 0.61–1.24)
GFR, Estimated: >60 mL/min (ref >60)
Glucose, Bld: 151 mg/dL — ABNORMAL HIGH (ref 70–99)
Potassium: 3.1 mmol/L — ABNORMAL LOW (ref 3.5–5.1)
Sodium: 134 mmol/L — ABNORMAL LOW (ref 135–145)
Total Bilirubin: 0.7 mg/dL (ref 0.3–1.2)
Total Protein: 6.4 g/dL — ABNORMAL LOW (ref 6.5–8.1)

## 2021-05-18 LAB — RAPID URINE DRUG SCREEN, HOSP PERFORMED
Amphetamines: NOT DETECTED
Barbiturates: NOT DETECTED
Benzodiazepines: NOT DETECTED
Cocaine: NOT DETECTED
Opiates: NOT DETECTED
Tetrahydrocannabinol: NOT DETECTED

## 2021-05-18 LAB — URINALYSIS, ROUTINE W REFLEX MICROSCOPIC
Bilirubin Urine: NEGATIVE
Glucose, UA: NEGATIVE mg/dL
Hgb urine dipstick: NEGATIVE
Ketones, ur: 5 mg/dL — AB
Leukocytes,Ua: NEGATIVE
Nitrite: NEGATIVE
Protein, ur: NEGATIVE mg/dL
Specific Gravity, Urine: 1.015 (ref 1.005–1.030)
pH: 5 (ref 5.0–8.0)

## 2021-05-18 LAB — ETHANOL: Alcohol, Ethyl (B): 20 mg/dL — ABNORMAL HIGH (ref <10)

## 2021-05-18 LAB — ACETAMINOPHEN LEVEL: Acetaminophen (Tylenol), Serum: 25 ug/mL (ref 10–30)

## 2021-05-18 NOTE — ED Provider Notes (Signed)
Emergency Medicine Provider Triage Evaluation Note  Anthony [name] , a 65 y.o. male  was evaluated in triage.  Pt complains of acute on chronic back pain.  Patient reports injury approximately 15 years ago with crushing of 3 vertebra.  Patient reports he has had chronic pain since that time.  Reports it seems to be worse over the last several weeks but is unable to give me a specific timeframe.  States he has had several falls due to pain.  Denies numbness, tingling in his legs, loss of bowel or bladder control.  Does report that sometimes his legs feel weak due to pain.  Patient reports that he has been taking Tylenol over the last few weeks.  States he has been taking "more than I should" but is fully unable/unwilling to tell me how much or how often.  Records reviewed.  Patient has a history of alcohol use disorder with severe dependence.  Review of Systems  Positive:  Low back pain, upper back pain, cervical pain Negative: Numbness, tingling  Physical Exam  BP 119/65   Pulse 97   Resp 17   Ht 5\' 10"  (1.778 m)   Wt 75 kg   SpO2 100%   BMI 23.72 kg/m  Gen:   Awake, no distress   Resp:  Normal effort  MSK:   Moves extremities without difficulty  Other:  Antalgic gait, sensation intact to lower extremities  Medical Decision Making  Medically screening exam initiated at 10:27 PM.  Appropriate orders placed.  Anthony [name] was informed that the remainder of the evaluation will be completed by another provider, this initial triage assessment does not replace that evaluation, and the importance of remaining in the ED until their evaluation is complete.  Acute on chronic back pain.  Labs and imaging pending.   Yvone Slape, Gwenlyn Perking 05/18/21 2231    Davonna Belling, MD 05/18/21 [phone]

## 2021-05-18 NOTE — ED Triage Notes (Signed)
Patient arrived with EMS from [facility] reports chronic low back pain that worsened this week , denies recent injury or fall , ambulatory .

## 2021-05-19 ENCOUNTER — Emergency Department (HOSPITAL_COMMUNITY): Payer: Medicare Other

## 2021-05-19 DIAGNOSIS — S300XXA Contusion of lower back and pelvis, initial encounter: Secondary | ICD-10-CM | POA: Diagnosis not present

## 2021-05-19 NOTE — ED Notes (Signed)
Pt very upset about d/c. Does not want papers or v/s. Pt taken to bathroom to get dressed

## 2021-05-19 NOTE — ED Provider Notes (Signed)
EMERGENCY DEPARTMENT Provider Note   CSN: [id number] Arrival date & time: 05/18/21  2032     History Chief Complaint  Patient presents with   Back Pain    Anthony [name] is a 65 y.o. male with history of alcohol use disorder, severe dependence presents the emergency department via  with acute on chronic back pain for the past few weeks.  Patient reports he has chronic back pain for the past 15 years and has "3 crushed vertebrae".  He reports that he has had bowel and bladder movements on himself because he was unable to get up to go to the bathroom because of the pain.  Additionally, he reports blurry vision for some unknown time.  He denies any falls or traumas.  Patient has a large hematoma to his lower right back and posterior thigh.  Patient does report EtOH use stating he drank a quart of beer yesterday and usually drinks around a sixpack of beer a day.  He denies any shakes or tremors.  Denies any hallucinations.  He denies any headache or neck pain.  Denies any numbness or tingling.  Denies any saddle anesthesia. Patient reports he is not taking Tylenol for the pain, but will not tell me how much or how often, but states it does not help with the pain.  Back Pain Associated symptoms: no abdominal pain, no chest pain, no dysuria, no fever, no headaches, no numbness and no weakness       Past Medical History:  Diagnosis Date   Actinic keratosis 11/27/2016   Alcohol abuse with alcohol-induced mood disorder ([place]) 07/18/2018   Alcohol use disorder, severe, dependence ([place]) 09/16/2006   05/22/2015 Argumentative, belligerent and verbally abusive to staff per his note from [facility])    Anxiety state 04/25/2018   Chronic low back pain 09/16/2006   Followed by NS.  Per his chart from [place], he fell off two storyhouse roof while cleaning his brother's gutters and sustained compression fracture of L3, L4 and L5 and fracture of right  transverse process at L3 and nondisplaced fracture of left glenoid rim many years ago. He had multiple imaging in [place] including Lumbar MRI in 2016 which showed moderate to severe spinal canal stenos   Delirium tremens ([place]) 09/01/2017   Depression    Dry eye 11/23/2016   Hiatal hernia 09/14/2016   Status Collis gastroplasty and Nissen's fundoplication on 34/74/2595 in [place]   History of delirium tremons 07/22/2020   DTs during 10/2016 08/2017. And 12/2017 admissions    History of pulmonary embolus (PE) 11/02/2016   Unprovoked 11/01/2016. Xarelto from 11/01/16 to 09/08/2017.   Hydrocele    Surgically corrected   Hypertension    Hypoalbuminemia due to protein-calorie malnutrition ([place]) 07/22/2020   Lumbar compression fracture (HCC)    Multiple rib fractures 07/22/2019   Left rib details XR: left ribs demonstrate multiple remote rib   Pancytopenia ([place])    Rhabdomyolysis 07/22/2020   Skin cancer    exciced 2017   Tinea versicolor 06/08/2013   Tobacco use disorder 07/22/2020   Tooth infection 04/25/2018   Trigger finger, acquired 11/18/2012   Ulnar tunnel syndrome of right wrist 11/08/2012    Patient Active Problem List   Diagnosis Date Noted   Iron deficiency anemia 10/16/2020   Cough    Hypomagnesemia    Protein-calorie malnutrition, severe 08/22/2020   Alcohol use disorder, severe, in controlled environment ([place])    Pressure injury of  skin 08/18/2020   Urinary tract infection without hematuria    Severe sepsis ([place]) 08/14/2020   Atrial fibrillation ([place]) 08/13/2020   Rhabdomyolysis 07/22/2020   Cerebral ventriculomegaly 07/22/2020   Hemoglobin decreased 07/22/2020   History of delirium tremons 07/22/2020   Hypoalbuminemia due to protein-calorie malnutrition ([place]) 07/22/2020   Tobacco use disorder 07/22/2020   Alcohol withdrawal delirium ([place])    Dehydration    Alcohol abuse with alcohol-induced mood disorder ([place]) 07/18/2018   Depression 05/26/2018   Anxiety state  04/25/2018   Need for immunization against influenza 04/25/2018   Alcohol withdrawal ([place]) 09/01/2017   DOE (dyspnea on exertion) 11/27/2016   Actinic keratosis 11/27/2016   Macrocytic anemia 11/23/2016   History of pulmonary embolus (PE) 11/02/2016   Hiatal hernia 09/14/2016   Skin cancer 08/13/2016   Poor social situation 06/09/2013   Tinea versicolor 06/08/2013   Seborrheic dermatitis of scalp 11/08/2012   HTN (hypertension) 04/11/2012   Alcohol use disorder, severe, dependence ([place]) 09/16/2006    Past Surgical History:  Procedure Laterality Date   CATARACT EXTRACTION     HIATAL HERNIA REPAIR  2016   HYDROCELE EXCISION / REPAIR  2010   SKIN CANCER EXCISION Left    Excised 2017       Family History  Problem Relation Age of Onset   Heart attack Father        died at 74 years   Dementia Father    Stroke Father    Cancer Sister    Colon cancer Neg Hx    Colon polyps Neg Hx    Esophageal cancer Neg Hx    Stomach cancer Neg Hx    Rectal cancer Neg Hx     Social History   Tobacco Use   Smoking status: Never   Smokeless tobacco: Current    Types: Snuff   Tobacco comments:    Pt states do snuff when stressed had 1 can last 3 month//PL  Vaping Use   Vaping Use: Never used  Substance Use Topics   Alcohol use: Yes    Alcohol/week: 43.0 standard drinks    Types: 42 Cans of beer, 1 Standard drinks or equivalent per week    Comment: daily use-6 beers daily and whiskey when he has it.   Drug use: Yes    Types: Marijuana    Comment: occasional-none this year per pt    Home Medications Prior to Admission medications   Medication Sig Start Date End Date Taking? Authorizing Provider  amiodarone (PACERONE) 200 MG tablet TAKE 1 TABLET (200 MG TOTAL) BY MOUTH DAILY. Patient not taking: Reported on 03/13/2021 10/08/20 10/08/21  Lilland, Alana, DO  Baclofen 5 MG TABS TAKE 1 TABLET (5 MG) BY MOUTH THREE TIMES DAILY. Patient not taking: Reported on 03/13/2021 10/08/20 10/08/21   Lilland, Lorrin Goodell, DO  ferrous sulfate 325 (65 FE) MG tablet Take 1 tablet (325 mg total) by mouth daily. 10/08/20   Lilland, Alana, DO  ferrous sulfate 325 (65 FE) MG tablet TAKE 1 TABLET BY MOUTH TWO TIMES WEEKLY Patient not taking: Reported on 03/13/2021 08/09/20 08/09/21  Zola Button, MD  folic acid (FOLVITE) 1 MG tablet TAKE 1 TABLET (1 MG TOTAL) BY MOUTH DAILY. Patient not taking: Reported on 03/13/2021 10/08/20 10/08/21  Lilland, Lorrin Goodell, DO  furosemide (LASIX) 20 MG tablet TAKE 1 TABLET (20 MG TOTAL) BY MOUTH DAILY. 10/08/20 10/08/21  Lilland, Alana, DO  loratadine (CLARITIN) 10 MG tablet TAKE 1 TABLET (10 MG TOTAL) BY MOUTH DAILY. 10/08/20 10/08/21  Lilland, Alana, DO  magnesium chloride (SLOW-MAG) 64 MG TBEC SR tablet Take 1 tablet (64 mg total) by mouth daily. Patient not taking: Reported on 03/13/2021 10/09/20   Rise Patience, DO  Magnesium Chloride 535 (64 Mg) MG TBCR TAKE 1 TABLET (64 MG TOTAL) BY MOUTH DAILY. Patient not taking: Reported on 03/13/2021 10/08/20 10/08/21  Lilland, Alana, DO  magnesium oxide (MAG-OX) 400 MG tablet TAKE 1 TABLET (400 MG TOTAL) BY MOUTH 2 (TWO) TIMES DAILY. Patient not taking: Reported on 03/13/2021 08/09/20 08/09/21  Lurline Del, DO  magnesium oxide (MAG-OX) 400 MG tablet TAKE 1 TABLET (400 MG TOTAL) BY MOUTH TWO TIMES DAILY. Patient not taking: Reported on 03/13/2021 08/08/20 08/08/21  Zola Button, MD  metoprolol succinate (TOPROL-XL) 25 MG 24 hr tablet TAKE 1/2 TABLET (12.5 MG TOTAL) BY MOUTH DAILY. Patient not taking: Reported on 03/13/2021 10/08/20 10/08/21  Lilland, Alana, DO  mometasone-formoterol (DULERA) 100-5 MCG/ACT AERO INHALE 2 PUFFS INTO THE LUNGS TWO TIMES DAILY. 10/08/20 10/08/21  Lilland, Lorrin Goodell, DO  rivaroxaban (XARELTO) 20 MG TABS tablet TAKE 1 TABLET (20 MG TOTAL) BY MOUTH DAILY WITH SUPPER. Patient not taking: Reported on 03/13/2021 10/08/20 10/08/21  Lilland, Alana, DO  rosuvastatin (CRESTOR) 10 MG tablet TAKE 1 TABLET (10 MG TOTAL) BY MOUTH DAILY. Patient not  taking: Reported on 03/13/2021 10/08/20 10/08/21  Lilland, Alana, DO  sertraline (ZOLOFT) 100 MG tablet TAKE 1 TABLET (100 MG TOTAL) BY MOUTH DAILY. 10/08/20 10/08/21  Lilland, Alana, DO  thiamine 100 MG tablet TAKE 1 TABLET (100 MG TOTAL) BY MOUTH DAILY. 10/08/20 10/08/21  Rise Patience, DO    Allergies    Patient has no known allergies.  Review of Systems   Review of Systems  Constitutional:  Negative for chills and fever.  HENT:  Negative for ear pain and sore throat.   Eyes:  Positive for visual disturbance. Negative for pain.  Respiratory:  Negative for cough and shortness of breath.   Cardiovascular:  Negative for chest pain and palpitations.  Gastrointestinal:  Negative for abdominal pain and vomiting.  Genitourinary:  Negative for decreased urine volume, difficulty urinating, dysuria, hematuria and urgency.  Musculoskeletal:  Positive for back pain. Negative for arthralgias.  Skin:  Negative for color change and rash.  Neurological:  Negative for seizures, syncope, weakness, numbness and headaches.  All other systems reviewed and are negative.  Physical Exam Updated Vital Signs BP 115/79   Pulse 85   Resp 16   Ht 5\' 10"  (1.778 m)   Wt 75 kg   SpO2 98%   BMI 23.72 kg/m   Physical Exam Vitals and nursing note reviewed.  Constitutional:      General: He is not in acute distress.    Appearance: Normal appearance. He is not toxic-appearing.     Comments: Appears unkempt.  Answers questions appropriately with normal speech.  HENT:     Head: Normocephalic and atraumatic.  Eyes:     General: No scleral icterus. Neck:     Comments: No step-offs or deformities noted.  No overlying skin changes, erythema, ecchymosis, or rash.  No cervical midline or paraspinal tenderness. Cardiovascular:     Rate and Rhythm: Normal rate and regular rhythm.  Pulmonary:     Effort: Pulmonary effort is normal. No respiratory distress.     Breath sounds: Normal breath sounds.  Abdominal:      General: Abdomen is flat. Bowel sounds are normal.     Palpations: Abdomen is soft.  Musculoskeletal:  General: No swelling or deformity. Normal range of motion.     Cervical back: Normal range of motion. No tenderness.     Comments: Down on the IVThe patient was able to ambulate from a lying to sitting position with use.  No midline or paraspinal tenderness to the cervical, thoracic, lumbar, or sacral area.  No step-offs or deformities noted.  No overlying skin changes, erythema, rash, or ecchymosis noted to the C/T/L area.  Pedal and PT pulses intact.  Compartments soft.  Sensation intact.  Faint scar to the medial right ankle.  No bilateral edema.  The patient can lift bilateral legs against resistance.  He can flex and extend ankles and flex and extend knees.  Will observe patient ambulate.  Patient was able to sit up and stand from a lying position without assistance. Ambulated patient ER hallway and back with ease and without assistance.    Skin:    General: Skin is warm and dry.     Findings: Bruising present.     Comments: Patient has a large bruise of unknown age to the right gluteus going down to the posterior right thigh.  Compartments soft.  Neurological:     General: No focal deficit present.     Mental Status: He is alert and oriented to person, place, and time. Mental status is at baseline.     Sensory: No sensory deficit.     Motor: No weakness.     Gait: Gait normal.     Comments: Sensation intact.    ED Results / Procedures / Treatments   Labs (all labs ordered are listed, but only abnormal results are displayed) Labs Reviewed  CBC WITH DIFFERENTIAL/PLATELET - Abnormal; Notable for the following components:      Result Value   RBC 2.66 (*)    Hemoglobin 8.7 (*)    HCT 28.0 (*)    MCV 105.3 (*)    RDW 18.8 (*)    All other components within normal limits  COMPREHENSIVE METABOLIC PANEL - Abnormal; Notable for the following components:   Sodium 134 (*)     Potassium 3.1 (*)    CO2 17 (*)    Glucose, Bld 151 (*)    Calcium 8.3 (*)    Total Protein 6.4 (*)    Albumin 3.1 (*)    AST 138 (*)    ALT 59 (*)    All other components within normal limits  ETHANOL - Abnormal; Notable for the following components:   Alcohol, Ethyl (B) 20 (*)    All other components within normal limits  URINALYSIS, ROUTINE W REFLEX MICROSCOPIC - Abnormal; Notable for the following components:   Ketones, ur 5 (*)    All other components within normal limits  ACETAMINOPHEN LEVEL  RAPID URINE DRUG SCREEN, HOSP PERFORMED    EKG None  Radiology DG Cervical Spine Complete  Result Date: 05/18/2021 CLINICAL DATA:  Back pain EXAM: CERVICAL SPINE - COMPLETE 4+ VIEW COMPARISON:  None. FINDINGS: Normal thoracic kyphosis. No evidence of fracture or dislocation. Vertebral body heights are maintained. Dens appears intact. Lateral masses C1 are symmetric. No prevertebral soft tissue swelling. Moderate degenerative changes of the mid/lower cervical spine. Evaluation of the bilateral neural foramina is constrained by obliquity. Visualized lung apices are clear. IMPRESSION: No fracture or dislocation is seen. Moderate degenerative changes. Electronically Signed   By: Julian Hy M.D.   On: 05/18/2021 23:32   DG Thoracic Spine 2 View  Result Date: 05/18/2021 CLINICAL DATA:  Back pain EXAM: THORACIC SPINE 2 VIEWS COMPARISON:  None. FINDINGS: Normal thoracic kyphosis. No evidence of fracture or dislocation. Vertebral body heights are maintained. Visualized lungs are clear. Surgical clips in the left upper abdomen. IMPRESSION: Negative. Electronically Signed   By: Julian Hy M.D.   On: 05/18/2021 23:34   DG Lumbar Spine Complete  Result Date: 05/18/2021 CLINICAL DATA:  Back pain EXAM: LUMBAR SPINE - COMPLETE 4+ VIEW COMPARISON:  MRI lumbar spine dated 02/01/2017. FINDINGS: Five lumbar-type vertebral bodies with a partially lumbarized S1. Normal lumbar lordosis. Mild  superior endplate compression fracture deformity at L2 through L4, chronic. No retropulsion. No evidence of acute fracture or dislocation. Visualized bony pelvis appears intact. Surgical clips in the left upper abdomen. IMPRESSION: Chronic mild superior endplate compression fracture deformities at L2-4. Electronically Signed   By: Julian Hy M.D.   On: 05/18/2021 23:34   DG Pelvis 1-2 Views  Result Date: 05/19/2021 CLINICAL DATA:  Trauma, fall, pain EXAM: PELVIS - 1-2 VIEW COMPARISON:  None. FINDINGS: No displaced fracture is seen. Joint spaces in both hips appear symmetrical. Arterial calcifications are seen in soft tissues. Degenerative changes are noted in the lower lumbar spine. IMPRESSION: No recent fracture is seen in pelvis. Electronically Signed   By: Elmer Picker M.D.   On: 05/19/2021 09:24   CT Head Wo Contrast  Result Date: 05/19/2021 CLINICAL DATA:  Trauma EXAM: CT HEAD WITHOUT CONTRAST TECHNIQUE: Contiguous axial images were obtained from the base of the skull through the vertex without intravenous contrast. COMPARISON:  CT done on 07/21/2020 and MR done on 08/16/2020 FINDINGS: Brain: There are no signs of recent bleeding within the cranium. There is no focal mass effect. Cortical sulci are prominent. Vascular: There are scattered arterial calcifications. Skull: Unremarkable. Sinuses/Orbits: Unremarkable Other: None IMPRESSION: No acute intracranial findings are seen in noncontrast CT brain. Atrophy. No significant interval changes are noted. Electronically Signed   By: Elmer Picker M.D.   On: 05/19/2021 09:46    Procedures Procedures   Medications Ordered in ED Medications - No data to display  ED Course  I have reviewed the triage vital signs and the nursing notes.  Pertinent labs & imaging results that were available during my care of the patient were reviewed by me and considered in my medical decision making (see chart for details).  This patient was seen  here for chronic back pain and blurry vision.  Patient has a known history of alcohol use disorder with severe dependence and reports he has not had a fall.  His physical exam shows signs of a fall due to the large bruising to his right gluteus and right posterior thigh.  Patient is unable to tell me how long his blurry vision has been going on, but will order head CT to rule out bleed from possible fall due to EtOH.  After discussion with the patient, his incontinence is due to pain or intoxication, and is not true incontinence as he knows and chooses to void or have a bowel movement, but does not want to get up or cannot get up.  I personally reviewed the patient's labs and imaging.  Mild hypokalemia and hyponatremia that the patient can replenish outpatient. Mildly hyperglycemic at 151. Elevated liver enzymes, patient is a known heavy alcohol user.  Urinalysis and UDA negative.  CBC shows no leukocytosis. Anemia present with hemoglobin 8.7 with baseline around 10-11. Will inform patient to follow up with PCP about this. Positive EtOH.  X-ray of  lumbar shows chronic compression fractures of L2-4, consistent with patient's history. Negative pelvis, thoracic, and cervical spine plain films. CT head shows no acute intracranial abnormalities.   Patient is ambulatory in the ED and is requesting food. I discussed the negative lab and imaging findings and reassuring physical exam findings with the patient who is refusing to go home  and would like to "go over my head with this one". Attending evaluated at bedside and assured the patient that he is safe to go home. Security involved for discharge. Given patient's alcohol use and chronicity of pain, it is unsafe to send him home with narcotic pain medication. Patient is stable and being discharged home in good condition.   I discussed this case with my attending physician who cosigned this note including patient's presenting symptoms, physical exam, and planned  diagnostics and interventions. Attending physician stated agreement with plan or made changes to plan which were implemented.   Attending physician assessed patient at bedside.   MDM Rules/Calculators/A&P                         Final Clinical Impression(s) / ED Diagnoses Final diagnoses:  Chronic bilateral low back pain without sciatica    Rx / DC Orders ED Discharge Orders     None        Sherrell Puller, PA-C 05/20/21 1613    Truddie Hidden, MD 05/20/21 [phone]

## 2021-05-19 NOTE — Discharge Instructions (Addendum)
You were seen here today for your chronic back pain. Do not drink alcohol if you are taking over the counter pain medication as this may cause liver damage or GI bleeding. If you have any concern, new or worsening symptoms, please return to the nearest ER.

## 2021-05-19 NOTE — ED Notes (Signed)
Pt denies numbness and tingling. Pt states he has had loss of bowel and bladder, but due to not able to get up to go to bathroom from the back pain.

## 2021-06-18 ENCOUNTER — Emergency Department (HOSPITAL_COMMUNITY): Payer: Medicare Other

## 2021-06-18 ENCOUNTER — Emergency Department (HOSPITAL_COMMUNITY)
Admission: EM | Admit: 2021-06-18 | Discharge: 2021-06-19 | Disposition: A | Discharge status: Home / Self Care | Payer: Medicare Other | Attending: Emergency Medicine | Admitting: Emergency Medicine

## 2021-06-18 ENCOUNTER — Encounter (HOSPITAL_COMMUNITY): Payer: Self-pay | Admitting: Emergency Medicine

## 2021-06-18 ENCOUNTER — Other Ambulatory Visit: Payer: Self-pay

## 2021-06-18 DIAGNOSIS — F17220 Nicotine dependence, chewing tobacco, uncomplicated: Secondary | ICD-10-CM | POA: Insufficient documentation

## 2021-06-18 DIAGNOSIS — I10 Essential (primary) hypertension: Secondary | ICD-10-CM | POA: Diagnosis not present

## 2021-06-18 DIAGNOSIS — Z7901 Long term (current) use of anticoagulants: Secondary | ICD-10-CM | POA: Insufficient documentation

## 2021-06-18 DIAGNOSIS — Y900 Blood alcohol level of less than 20 mg/100 ml: Secondary | ICD-10-CM | POA: Insufficient documentation

## 2021-06-18 DIAGNOSIS — R443 Hallucinations, unspecified: Secondary | ICD-10-CM | POA: Insufficient documentation

## 2021-06-18 DIAGNOSIS — Z85828 Personal history of other malignant neoplasm of skin: Secondary | ICD-10-CM | POA: Insufficient documentation

## 2021-06-18 DIAGNOSIS — R4182 Altered mental status, unspecified: Secondary | ICD-10-CM | POA: Diagnosis present

## 2021-06-18 DIAGNOSIS — Z79899 Other long term (current) drug therapy: Secondary | ICD-10-CM | POA: Diagnosis not present

## 2021-06-18 DIAGNOSIS — F1020 Alcohol dependence, uncomplicated: Secondary | ICD-10-CM | POA: Diagnosis present

## 2021-06-18 DIAGNOSIS — Z20822 Contact with and (suspected) exposure to covid-19: Secondary | ICD-10-CM | POA: Diagnosis not present

## 2021-06-18 LAB — COMPREHENSIVE METABOLIC PANEL
ALT: 24 U/L (ref 0–44)
AST: 59 U/L — ABNORMAL HIGH (ref 15–41)
Albumin: 3.7 g/dL (ref 3.5–5.0)
Alkaline Phosphatase: 86 U/L (ref 38–126)
Anion gap: 15 (ref 5–15)
BUN: 25 mg/dL — ABNORMAL HIGH (ref 8–23)
CO2: 17 mmol/L — ABNORMAL LOW (ref 22–32)
Calcium: 8.9 mg/dL (ref 8.9–10.3)
Chloride: 102 mmol/L (ref 98–111)
Creatinine, Ser: 1.2 mg/dL (ref 0.61–1.24)
GFR, Estimated: >60 mL/min (ref >60)
Glucose, Bld: 74 mg/dL (ref 70–99)
Potassium: 3.9 mmol/L (ref 3.5–5.1)
Sodium: 134 mmol/L — ABNORMAL LOW (ref 135–145)
Total Bilirubin: 1.3 mg/dL — ABNORMAL HIGH (ref 0.3–1.2)
Total Protein: 7.7 g/dL (ref 6.5–8.1)

## 2021-06-18 LAB — CBC
HCT: 28.7 % — ABNORMAL LOW (ref 39.0–52.0)
Hemoglobin: 9.1 g/dL — ABNORMAL LOW (ref 13.0–17.0)
MCH: 33.5 pg (ref 26.0–34.0)
MCHC: 31.7 g/dL (ref 30.0–36.0)
MCV: 105.5 fL — ABNORMAL HIGH (ref 80.0–100.0)
Platelets: 246 10*3/uL (ref 150–400)
RBC: 2.72 MIL/uL — ABNORMAL LOW (ref 4.22–5.81)
RDW: 16.3 % — ABNORMAL HIGH (ref 11.5–15.5)
WBC: 6.8 10*3/uL (ref 4.0–10.5)
nRBC: 0 % (ref 0.0–0.2)

## 2021-06-18 LAB — RESP PANEL BY RT-PCR (FLU A&B, COVID) ARPGX2
Influenza A by PCR: NEGATIVE
Influenza B by PCR: NEGATIVE
SARS Coronavirus 2 by RT PCR: NEGATIVE

## 2021-06-18 LAB — ETHANOL: Alcohol, Ethyl (B): <10 mg/dL (ref <10)

## 2021-06-18 MED ORDER — SODIUM CHLORIDE 0.9 % IV BOLUS
1000.0000 mL | Freq: Once | INTRAVENOUS | Status: AC
  Administered 2021-06-18: 1000 mL via INTRAVENOUS

## 2021-06-18 MED ORDER — LORAZEPAM 1 MG PO TABS
0.0000 mg | ORAL_TABLET | Freq: Four times a day (QID) | ORAL | Status: DC
  Administered 2021-06-19 (×2): 1 mg via ORAL
  Filled 2021-06-18 (×2): qty 1

## 2021-06-18 MED ORDER — LORAZEPAM 2 MG/ML IJ SOLN: 0.0000 mg | Freq: Two times a day (BID) | INTRAMUSCULAR | Status: DC

## 2021-06-18 MED ORDER — THIAMINE HCL 100 MG PO TABS
100.0000 mg | ORAL_TABLET | Freq: Every day | ORAL | Status: DC
  Administered 2021-06-18 – 2021-06-19 (×2): 100 mg via ORAL
  Filled 2021-06-18 (×2): qty 1

## 2021-06-18 MED ORDER — LORAZEPAM 2 MG/ML IJ SOLN: 0.0000 mg | Freq: Four times a day (QID) | INTRAMUSCULAR | Status: DC

## 2021-06-18 MED ORDER — LORAZEPAM 1 MG PO TABS: 0.0000 mg | ORAL_TABLET | Freq: Two times a day (BID) | ORAL | Status: DC

## 2021-06-18 MED ORDER — THIAMINE HCL 100 MG/ML IJ SOLN: 100.0000 mg | Freq: Every day | INTRAMUSCULAR | Status: DC
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
## 2021-06-18 NOTE — ED Notes (Signed)
Pt washing up. Pt changing into burgundy scrubs.
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
## 2021-06-18 NOTE — BH Assessment (Signed)
Comprehensive Clinical Assessment (CCA) Note  06/19/2021 Anthony [name] [id number]  Discharge Disposition: Lindon Romp, NP, reviewed pt's chart and information and determined pt meets criteria for gero psych. Pt's referral information will be faxed out to multiple hospitals for potential placement. This information was relayed to pt's team at 2108.  The patient demonstrates the following risk factors for suicide: Chronic risk factors for suicide include: psychiatric disorder of Alcohol-induced depressive disorder, With moderate or severe use disorder and substance use disorder. Acute risk factors for suicide include: N/A. Protective factors for this patient include: hope for the future. Considering these factors, the overall suicide risk at this point appears to be none. Patient is not appropriate for outpatient follow up.  Therefore, no sitter is recommended for suicide precautions.  [place] ED from 06/18/2021 in [facility] DEPT ED from 05/18/2021 in [place] ED to Hosp-Admission (Discharged) from 08/13/2020 in [place] CATEGORY No Risk No Risk No Risk     Chief Complaint:  Chief Complaint  Patient presents with   Altered Mental Status   Visit Diagnosis: F10.24, Alcohol-induced depressive disorder, With severe use disorder  CCA Screening, Triage and Referral (STR) Anthony [name] is a 65 year old patient who was brought to the [facility] by EMS at the request of [employer] due to pt being found asleep on a stranger's porch and appearing to have ALS. Pt is unable to specify specifically why he is in the hospital, though he talks multiple times about breaking his back and needing a tracheotomy (in the past, though he cannot remember when this occurred).   Pt denies SI, any prior SI, or any plan or any prior attempts to kill himself. He denies HI, AVH, NSSIB, access to guns/weapons, or  engagement with the legal system. Pt acknowledges he engages in the use of a 6-pack of 12-ounce beers on a daily basis. He states he last drank yesterday. When detoxing, pt states he experiences shakiness and has blacked out.  Pt is oriented x5 (he cannot identify why he is in the hospital). His recent/remote memory is UTA. Pt was cooperative, though talkative, throughout the assessment process. Pt's insight, judgement, and impulse control is impaired at this time.  Patient Reported Information How did you hear about [place]? Legal System  What Is the Reason for Your Visit/Call Today? Pt is unable to specify specifically why he is in the hospital, though he talks multiple times about breaking his back and needing a tracheotomy (in the past, though he cannot remember when this occurred). Pt denies SI, any prior SI, or any plan or any prior attempts to kill himself. He denies HI, AVH, NSSIB, access to guns/weapons, or engagement with the legal system. Pt acknowledges he engages in the use of a 6-pack of 12-ounce beers on a daily basis. He states he last drank yesterday. When detoxing, pt states he experiences shakiness and has blacked out.  How Long Has This Been Causing You Problems? > than 6 months  What Do You Feel Would Help You the Most Today? -- (Pt is unsure)   Have You Recently Had Any Thoughts About Hurting Yourself? No  Are You Planning to Commit Suicide/Harm Yourself At This time? No   Have you Recently Had Thoughts About [place]? No  Are You Planning to Harm Someone at This Time? No  Explanation: No data recorded  Have You Used Any Alcohol or Drugs in  the Past 24 Hours? Yes  How Long Ago Did You Use Drugs or Alcohol? No data recorded What Did You Use and How Much? EtOH - a 6-pack of 12-ounce beers   Do You Currently Have a Therapist/Psychiatrist? No  Name of Therapist/Psychiatrist: No data recorded  Have You Been Recently Discharged From Any Office Practice or  Programs? No  Explanation of Discharge From Practice/Program: No data recorded    CCA Screening Triage Referral Assessment Type of Contact: Tele-Assessment  Telemedicine Service Delivery: Telemedicine service delivery: This service was provided via telemedicine using a 2-way, interactive audio and video technology  Is this Initial or Reassessment? Initial Assessment  Date Telepsych consult ordered in [facility]:  06/18/21  Time Telepsych consult ordered in [facility]:  1615  Location of Assessment: [facility] ED  Provider Location: [facility]   Collateral Involvement: None at this time   Does Patient Have a [place]? No data recorded Name and Contact of Legal Guardian: No data recorded If Minor and Not Living with Parent(s), Who has Custody? N/A  Is CPS involved or ever been involved? Never  Is APS involved or ever been involved? Never   Patient Determined To Be At Risk for Harm To Self or Others Based on Review of Patient Reported Information or Presenting Complaint? No  Method: No data recorded Availability of Means: No data recorded Intent: No data recorded Notification Required: No data recorded Additional Information for Danger to Others Potential: No data recorded Additional Comments for Danger to Others Potential: No data recorded Are There Guns or Other Weapons in Your Home? No data recorded Types of Guns/Weapons: No data recorded Are These Weapons Safely Secured?                            No data recorded Who Could Verify You Are Able To Have These Secured: No data recorded Do You Have any Outstanding Charges, Pending Court Dates, Parole/Probation? No data recorded Contacted To Inform of Risk of Harm To Self or Others: -- (N/A)    Does Patient Present under Involuntary Commitment? No  IVC Papers Initial File Date: No data recorded  [place] of Residence: [place]   Patient Currently Receiving the Following Services: Not Receiving  Services   Determination of Need: Emergent (2 hours)   Options For Referral: Inpatient Hospitalization     CCA Biopsychosocial Patient Reported Schizophrenia/Schizoaffective Diagnosis in Past: -- (Unknown)   Strengths: Pt is able to identify his name, DOB, today's date, and where he is. It's unclear at this time if pt doesn't understand why he came to the hospital or if he just prefers to talk about when he broke his back. Pt is polite and answers the questions posed.   Mental Health Symptoms Depression:   None   Duration of Depressive symptoms:    Mania:   None   Anxiety:    None   Psychosis:   None   Duration of Psychotic symptoms:    Trauma:   None   Obsessions:   None   Compulsions:   None   Inattention:   None   Hyperactivity/Impulsivity:   None   Oppositional/Defiant Behaviors:   None   Emotional Irregularity:   Potentially harmful impulsivity   Other Mood/Personality Symptoms:   None noted    Mental Status Exam Appearance and self-care  Stature:   Average   Weight:   Average weight   Clothing:   -- (  Pt is dressed in a hospital gown)   Grooming:   Neglected   Cosmetic use:   None   Posture/gait:   Normal   Motor activity:   Not Remarkable   Sensorium  Attention:   Distractible   Concentration:   Focuses on irrelevancies   Orientation:   Object; Person; Place; Time   Recall/memory:   -- (UTA)   Affect and Mood  Affect:   Appropriate   Mood:   Euthymic   Relating  Eye contact:   Normal   Facial expression:   Responsive   Attitude toward examiner:   Cooperative   Thought and Language  Speech flow:  Clear and Coherent   Thought content:   Appropriate to Mood and Circumstances   Preoccupation:   None   Hallucinations:   None   Organization:  No data recorded  [employer] of Knowledge:   Average   Intelligence:   Average   Abstraction:   Functional   Judgement:    Impaired   Reality Testing:   Adequate   Insight:   Fair   Decision Making:   Impulsive   Social Functioning  Social Maturity:   Impulsive   Social Judgement:   Heedless   Stress  Stressors:   -- (None noted)   Coping Ability:   Deficient supports   Skill Deficits:   Self-control   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)     Religion: Religion/Spirituality Are You A Religious Person?:  (Not assessed) How Might This Affect Treatment?: Not assessed  Leisure/Recreation: Leisure / Recreation Do You Have Hobbies?:  (Not assessed)  Exercise/Diet: Exercise/Diet Do You Exercise?:  (Not assessed) Have You Gained or Lost A Significant Amount of Weight in the Past Six Months?:  (Not assessed) Do You Follow a Special Diet?:  (Not assessed) Do You Have Any Trouble Sleeping?:  (Not assessed)   CCA Employment/Education Employment/Work Situation: Employment / Work Situation 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) Patient's Job has Been Impacted by Current Illness:  (UTA) 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)  Education: Education Is Patient Currently Attending School?: No Last Grade Completed:  (UTA) Did You Attend College?:  (UTA) Did You Have An Individualized Education Program (IIEP):  (UTA) Did You Have Any Difficulty At School?:  (UTA) Patient's Education Has Been Impacted by Current Illness:  (UTA)   CCA Family/Childhood History Family and Relationship History: Family history Marital status:  (Not assessed) Does patient have children?:  (Not assessed)  Childhood History:  Childhood History By whom was/is the patient raised?:  (Not assessed) Did patient suffer any verbal/emotional/physical/sexual abuse as a child?:  (Not assessed) Did patient suffer from severe childhood neglect?:  (Not assessed) Has patient ever been sexually abused/assaulted/raped as an adolescent or adult?:  (Not assessed) Was the patient ever a victim of a crime or a disaster?:  (Not  assessed) Witnessed domestic violence?:  (Not assessed) Has patient been affected by domestic violence as an adult?:  (Not assessed)  Child/Adolescent Assessment:     CCA Substance Use Alcohol/Drug Use: Alcohol / Drug Use Pain Medications: See MAR Prescriptions: See MAR Over the Counter: See MAR History of alcohol / drug use?: Yes Longest period of sobriety (when/how long): 4 years Negative Consequences of Use:  (None noted) Withdrawal Symptoms: Blackouts, Tremors Substance #1 Name of Substance 1: EtOH 1 - Age of First Use: 12 1 - Amount (size/oz): 6-pack of 12-ounce beers 1 - Frequency: Daily 1 - Duration: Unknown 1 -  Last Use / Amount: Yesterday (06/17/2021) 1 - Method of Aquiring: Unknown 1- Route of Use: Oral                       ASAM's:  Six Dimensions of Multidimensional Assessment  Dimension 1:  Acute Intoxication and/or Withdrawal Potential:   Dimension 1:  Description of individual's past and current experiences of substance use and withdrawal: Pt notes he has blacked out and had tremors when w/d in the past  Dimension 2:  Biomedical Conditions and Complications:   Dimension 2:  Description of patient's biomedical conditions and  complications: Pt denies any current ailments  Dimension 3:  Emotional, Behavioral, or Cognitive Conditions and Complications:  Dimension 3:  Description of emotional, behavioral, or cognitive conditions and complications: Pt was found on a stranger's porch earlier today  Dimension 4:  Readiness to Change:  Dimension 4:  Description of Readiness to Change criteria: Pt notes no need to stop using EtOH  Dimension 5:  Relapse, Continued use, or Continued Problem Potential:  Dimension 5:  Relapse, continued use, or continued problem potential critiera description: Pt identifies no need to stop drinking  Dimension 6:  Recovery/Living Environment:  Dimension 6:  Recovery/Iiving environment criteria description: Pt is currently homeless   ASAM Severity Score: ASAM's Severity Rating Score: 11  ASAM Recommended Level of Treatment: ASAM Recommended Level of Treatment: Level I Outpatient Treatment   Substance use Disorder (SUD) Substance Use Disorder (SUD)  Checklist Symptoms of Substance Use: Continued use despite having a persistent/recurrent physical/psychological problem caused/exacerbated by use, Evidence of withdrawal (Comment), Persistent desire or unsuccessful efforts to cut down or control use  Recommendations for Services/Supports/Treatments: Recommendations for Services/Supports/Treatments Recommendations For Services/Supports/Treatments: Inpatient Hospitalization, Medication Management  Discharge Disposition: Lindon Romp, NP, reviewed pt's chart and information and determined pt meets criteria for gero psych. Pt's referral information will be faxed out to multiple hospitals for potential placement. This information was relayed to pt's team at 2108.  DSM5 Diagnoses: Patient Active Problem List   Diagnosis Date Noted   Iron deficiency anemia 10/16/2020   Cough    Hypomagnesemia    Protein-calorie malnutrition, severe 08/22/2020   Alcohol use disorder, severe, in controlled environment ([place])    Pressure injury of skin 08/18/2020   Urinary tract infection without hematuria    Severe sepsis ([place]) 08/14/2020   Atrial fibrillation ([place]) 08/13/2020   Rhabdomyolysis 07/22/2020   Cerebral ventriculomegaly 07/22/2020   Hemoglobin decreased 07/22/2020   History of delirium tremons 07/22/2020   Hypoalbuminemia due to protein-calorie malnutrition ([place]) 07/22/2020   Tobacco use disorder 07/22/2020   Alcohol withdrawal delirium ([place])    Dehydration    Alcohol abuse with alcohol-induced mood disorder ([place]) 07/18/2018   Depression 05/26/2018   Anxiety state 04/25/2018   Need for immunization against influenza 04/25/2018   Alcohol withdrawal ([place]) 09/01/2017   DOE (dyspnea on exertion) 11/27/2016   Actinic keratosis  11/27/2016   Macrocytic anemia 11/23/2016   History of pulmonary embolus (PE) 11/02/2016   Hiatal hernia 09/14/2016   Skin cancer 08/13/2016   Poor social situation 06/09/2013   Tinea versicolor 06/08/2013   Seborrheic dermatitis of scalp 11/08/2012   HTN (hypertension) 04/11/2012   Alcohol use disorder, severe, dependence ([place]) 09/16/2006     Referrals to Alternative Service(s): Referred to Alternative Service(s):   Place:   Date:   Time:    Referred to Alternative Service(s):   Place:   Date:   Time:    Referred  to Alternative Service(s):   Place:   Date:   Time:    Referred to Alternative Service(s):   Place:   Date:   Time:     Dannielle Burn, LMFT

## 2021-06-19 DIAGNOSIS — R4182 Altered mental status, unspecified: Secondary | ICD-10-CM | POA: Diagnosis not present

## 2021-06-19 NOTE — BH Assessment (Signed)
[facility] Assessment Progress Note   Per Ethelene Hal, NP, this voluntary pt does not require psychiatric hospitalization at this time.  Pt is psychiatrically cleared.  Discharge instructions include referral information for [facility] in [place] and for [facility] in [place].  Information about area supportive services for the homeless has also been included.  EDP Blanchie Dessert, MD and pt's nurse, Lynn Ito, have been notified.  Jalene Mullet, [place] Triage Specialist [phone]

## 2021-06-19 NOTE — ED Notes (Signed)
Pt provided hot pack per pt's request. DC instructions given, pt verbalized understanding that he's going to [facility] in [place]. Pt belongings given back to pt. [facility] transport called, will be here in 30 mins.

## 2021-06-19 NOTE — Consult Note (Signed)
Telepsych Consultation   Reason for Consult:  Altered mental status Referring Physician:  Shirlyn Goltz, Unk Pinto Location of Patient: [facility] Location of Provider: [facility]  Patient Identification: LORCAN SHELP MRN:  [id number] Principal Diagnosis: Alcohol use disorder, severe, dependence ([place]) Diagnosis:  Principal Problem:   Alcohol use disorder, severe, dependence ([place])   Total Time spent with patient: 30 minutes Subjective:   Tobechukwu A Nordmeyer is a 65 y.o. male patient admitted to [facility] after being found sleeping on a stranger's porch. He appeared to have altered mental status and EMS was called. Patient was seen, chart reviewed and case discussed with the treatment team and Dr Dwyane Dee. Patient admitted he drinks a 6 - 12 pack of beer every day and had been drinking yesterday. He stated he went on the porch to get out of the rain.  Patient denies SI/HI/AVH. He denies paranoia and delusions. He does not appear to be responding to internal stimuli. Patient appears disheveled and has circumstantial thought process, however he is easily redirected. He stated he needs help for his 3 crushed vertebrae and he thought that is what he was going to get at the emergency room. He denies withdrawal symptoms. He denies depression and anxiety. He is calm and cooperative. Patient stated he is homeless but stays in shelters. He stated he would like to go to [facility] in [place]. Patient is psychiatrically clear.     Past Psychiatric History: Alcohol use disorder  Risk to Self:  No Risk to Others:  No Prior Inpatient Therapy:  No Prior Outpatient Therapy:  No  Past Medical History:  Past Medical History:  Diagnosis Date   Actinic keratosis 11/27/2016   Alcohol abuse with alcohol-induced mood disorder ([place]) 07/18/2018   Alcohol use disorder, severe, dependence ([place]) 09/16/2006   05/22/2015 Argumentative, belligerent and verbally abusive to staff per his note from [facility])    Anxiety state 04/25/2018   Chronic low back pain 09/16/2006   Followed by NS.  Per his chart from [place], he fell off two storyhouse roof while cleaning his brother's gutters and sustained compression fracture of L3, L4 and L5 and fracture of right transverse process at L3 and nondisplaced fracture of left glenoid rim many years ago. He had multiple imaging in [place] including Lumbar MRI in 2016 which showed moderate to severe spinal canal stenos   Delirium tremens ([place]) 09/01/2017   Depression    Dry eye 11/23/2016   Hiatal hernia 09/14/2016   Status Collis gastroplasty and Nissen's fundoplication on 44/31/5400 in [place]   History of delirium tremons 07/22/2020   DTs during 10/2016 08/2017. And 12/2017 admissions    History of pulmonary embolus (PE) 11/02/2016   Unprovoked 11/01/2016. Xarelto from 11/01/16 to 09/08/2017.   Hydrocele    Surgically corrected   Hypertension    Hypoalbuminemia due to protein-calorie malnutrition ([place]) 07/22/2020   Lumbar compression fracture (HCC)    Multiple rib fractures 07/22/2019   Left rib details XR: left ribs demonstrate multiple remote rib   Pancytopenia ([place])    Rhabdomyolysis 07/22/2020   Skin cancer    exciced 2017   Tinea versicolor 06/08/2013   Tobacco use disorder 07/22/2020   Tooth infection 04/25/2018   Trigger finger, acquired 11/18/2012   Ulnar tunnel syndrome of right wrist 11/08/2012    Past Surgical History:  Procedure Laterality Date   CATARACT EXTRACTION     HIATAL HERNIA REPAIR  2016   HYDROCELE EXCISION /  REPAIR  2010   SKIN CANCER EXCISION Left    Excised 2017   Family History:  Family History  Problem Relation Age of Onset   Heart attack Father        died at 21 years   Dementia Father    Stroke Father    Cancer Sister    Colon cancer Neg Hx    Colon polyps Neg Hx    Esophageal cancer Neg Hx    Stomach cancer Neg Hx    Rectal cancer Neg Hx    Family Psychiatric  History: Unknown Social  History:  Social History   Substance and Sexual Activity  Alcohol Use Yes   Alcohol/week: 43.0 standard drinks   Types: 42 Cans of beer, 1 Standard drinks or equivalent per week   Comment: daily use-6 beers daily and whiskey when he has it.     Social History   Substance and Sexual Activity  Drug Use Yes   Types: Marijuana   Comment: occasional-none this year per pt    Social History   Socioeconomic History   Marital status: Single    Spouse name: Not on file   Number of children: Not on file   Years of education: Not on file   Highest education level: Not on file  Occupational History   Occupation: unemployed  Tobacco Use   Smoking status: Never   Smokeless tobacco: Current    Types: Snuff   Tobacco comments:    Pt states do snuff when stressed had 1 can last 3 month//PL  Vaping Use   Vaping Use: Never used  Substance and Sexual Activity   Alcohol use: Yes    Alcohol/week: 43.0 standard drinks    Types: 42 Cans of beer, 1 Standard drinks or equivalent per week    Comment: daily use-6 beers daily and whiskey when he has it.   Drug use: Yes    Types: Marijuana    Comment: occasional-none this year per pt   Sexual activity: Yes    Birth control/protection: None  Other Topics Concern   Not on file  Social History Narrative   Lives with a friend. Has children but hasn't met them in a while. He doesn't know where they live.    Social Determinants of Health   Financial Resource Strain: Not on file  Food Insecurity: Not on file  Transportation Needs: Not on file  Physical Activity: Not on file  Stress: Not on file  Social Connections: Not on file   Additional Social History:    Allergies:  No Known Allergies  Labs:  Results for orders placed or performed during the hospital encounter of 06/18/21 (from the past 48 hour(s))  Ethanol     Status: None   Collection Time: 06/18/21  1:26 PM  Result Value Ref Range   Alcohol, Ethyl (B) <10 <10 mg/dL    Comment:  (NOTE) Lowest detectable limit for serum alcohol is 10 mg/dL.  For medical purposes only. Performed at [facility], [place] [address], [place],  [address]   Comprehensive metabolic panel     Status: Abnormal   Collection Time: 06/18/21  1:59 PM  Result Value Ref Range   Sodium 134 (L) 135 - 145 mmol/L   Potassium 3.9 3.5 - 5.1 mmol/L   Chloride 102 98 - 111 mmol/L   CO2 17 (L) 22 - 32 mmol/L   Glucose, Bld 74 70 - 99 mg/dL    Comment: Glucose reference range applies  only to samples taken after fasting for at least 8 hours.   BUN 25 (H) 8 - 23 mg/dL   Creatinine, Ser 1.20 0.61 - 1.24 mg/dL   Calcium 8.9 8.9 - 10.3 mg/dL   Total Protein 7.7 6.5 - 8.1 g/dL   Albumin 3.7 3.5 - 5.0 g/dL   AST 59 (H) 15 - 41 U/L   ALT 24 0 - 44 U/L   Alkaline Phosphatase 86 38 - 126 U/L   Total Bilirubin 1.3 (H) 0.3 - 1.2 mg/dL   GFR, Estimated >60 >60 mL/min    Comment: (NOTE) Calculated using the CKD-EPI Creatinine Equation (2021)    Anion gap 15 5 - 15    Comment: Performed at [facility], [place] [address], [place], [place] [address]  CBC     Status: Abnormal   Collection Time: 06/18/21  1:59 PM  Result Value Ref Range   WBC 6.8 4.0 - 10.5 K/uL   RBC 2.72 (L) 4.22 - 5.81 MIL/uL   Hemoglobin 9.1 (L) 13.0 - 17.0 g/dL   HCT 28.7 (L) 39.0 - 52.0 %   MCV 105.5 (H) 80.0 - 100.0 fL   MCH 33.5 26.0 - 34.0 pg   MCHC 31.7 30.0 - 36.0 g/dL   RDW 16.3 (H) 11.5 - 15.5 %   Platelets 246 150 - 400 K/uL   nRBC 0.0 0.0 - 0.2 %    Comment: Performed at [facility], [place] [address], [place], [place] [address]  Resp Panel by RT-PCR (Flu A&B, Covid) Nasopharyngeal Swab     Status: None   Collection Time: 06/18/21  9:49 PM   Specimen: Nasopharyngeal Swab; Nasopharyngeal(NP) swabs in vial transport medium  Result Value Ref Range   SARS Coronavirus 2 by RT PCR NEGATIVE NEGATIVE    Comment: (NOTE) SARS-CoV-2 target nucleic acids are NOT  DETECTED.  The SARS-CoV-2 RNA is generally detectable in upper respiratory specimens during the acute phase of infection. The lowest concentration of SARS-CoV-2 viral copies this assay can detect is 138 copies/mL. A negative result does not preclude SARS-Cov-2 infection and should not be used as the sole basis for treatment or other patient management decisions. A negative result may occur with  improper specimen collection/handling, submission of specimen other than nasopharyngeal swab, presence of viral mutation(s) within the areas targeted by this assay, and inadequate number of viral copies(<138 copies/mL). A negative result must be combined with clinical observations, patient history, and epidemiological information. The expected result is Negative.  Fact Sheet for Patients:  EntrepreneurPulse.com.au  Fact Sheet for Healthcare Providers:  IncredibleEmployment.be  This test is no t yet approved or cleared by the [place] FDA and  has been authorized for detection and/or diagnosis of SARS-CoV-2 by FDA under an Emergency Use Authorization (EUA). This EUA will remain  in effect (meaning this test can be used) for the duration of the COVID-19 declaration under Section 564(b)(1) of the Act, 21 U.S.C.section 360bbb-3(b)(1), unless the authorization is terminated  or revoked sooner.       Influenza A by PCR NEGATIVE NEGATIVE   Influenza B by PCR NEGATIVE NEGATIVE    Comment: (NOTE) The Xpert Xpress SARS-CoV-2/FLU/RSV plus assay is intended as an aid in the diagnosis of influenza from Nasopharyngeal swab specimens and should not be used as a sole basis for treatment. Nasal washings and aspirates are unacceptable for Xpert Xpress SARS-CoV-2/FLU/RSV testing.  Fact Sheet for Patients: EntrepreneurPulse.com.au  Fact Sheet for Healthcare Providers: IncredibleEmployment.be  This test  is not yet approved or  cleared by the [place] and has been authorized for detection and/or diagnosis of SARS-CoV-2 by FDA under an Emergency Use Authorization (EUA). This EUA will remain in effect (meaning this test can be used) for the duration of the COVID-19 declaration under Section 564(b)(1) of the Act, 21 U.S.C. section 360bbb-3(b)(1), unless the authorization is terminated or revoked.  Performed at [facility], [place] [address], [place], [place] [address]     Medications:  Current Facility-Administered Medications  Medication Dose Route Frequency Provider Last Rate Last Admin   LORazepam (ATIVAN) injection 0-4 mg  0-4 mg Intravenous Q6H Drenda Freeze, MD       Or   LORazepam (ATIVAN) tablet 0-4 mg  0-4 mg Oral Q6H Drenda Freeze, MD   1 mg at 06/19/21 1015   [START ON 06/21/2021] LORazepam (ATIVAN) injection 0-4 mg  0-4 mg Intravenous Q12H Drenda Freeze, MD       Or   Derrill Memo ON 06/21/2021] LORazepam (ATIVAN) tablet 0-4 mg  0-4 mg Oral Q12H Drenda Freeze, MD       thiamine tablet 100 mg  100 mg Oral Daily Drenda Freeze, MD   100 mg at 06/19/21 1015   Or   thiamine (B-1) injection 100 mg  100 mg Intravenous Daily Drenda Freeze, MD       Current Outpatient Medications  Medication Sig Dispense Refill   acetaminophen (TYLENOL) 500 MG tablet Take 1,000 mg by mouth every 6 (six) hours as needed for moderate pain.     furosemide (LASIX) 20 MG tablet TAKE 1 TABLET (20 MG TOTAL) BY MOUTH DAILY. 30 tablet 0   loratadine (CLARITIN) 10 MG tablet TAKE 1 TABLET (10 MG TOTAL) BY MOUTH DAILY. 30 tablet 0   sertraline (ZOLOFT) 100 MG tablet TAKE 1 TABLET (100 MG TOTAL) BY MOUTH DAILY. 30 tablet 0   amiodarone (PACERONE) 200 MG tablet TAKE 1 TABLET (200 MG TOTAL) BY MOUTH DAILY. (Patient not taking: Reported on 03/13/2021) 30 tablet 0   Baclofen 5 MG TABS TAKE 1 TABLET (5 MG) BY MOUTH THREE TIMES DAILY. (Patient not taking: Reported on 03/13/2021) 30 tablet 0    ferrous sulfate 325 (65 FE) MG tablet Take 1 tablet (325 mg total) by mouth daily. (Patient not taking: Reported on 06/18/2021) 30 tablet 0   ferrous sulfate 325 (65 FE) MG tablet TAKE 1 TABLET BY MOUTH TWO TIMES WEEKLY (Patient not taking: Reported on 03/13/2021) 9 tablet 0   folic acid (FOLVITE) 1 MG tablet TAKE 1 TABLET (1 MG TOTAL) BY MOUTH DAILY. (Patient not taking: Reported on 03/13/2021) 30 tablet 0   magnesium chloride (SLOW-MAG) 64 MG TBEC SR tablet Take 1 tablet (64 mg total) by mouth daily. (Patient not taking: Reported on 03/13/2021) 30 tablet 0   Magnesium Chloride 535 (64 Mg) MG TBCR TAKE 1 TABLET (64 MG TOTAL) BY MOUTH DAILY. (Patient not taking: Reported on 03/13/2021) 30 tablet 0   magnesium oxide (MAG-OX) 400 MG tablet TAKE 1 TABLET (400 MG TOTAL) BY MOUTH 2 (TWO) TIMES DAILY. (Patient not taking: Reported on 03/13/2021) 120 tablet 0   magnesium oxide (MAG-OX) 400 MG tablet TAKE 1 TABLET (400 MG TOTAL) BY MOUTH TWO TIMES DAILY. (Patient not taking: Reported on 03/13/2021) 60 tablet 0   metoprolol succinate (TOPROL-XL) 25 MG 24 hr tablet TAKE 1/2 TABLET (12.5 MG TOTAL) BY MOUTH DAILY. (Patient not taking: Reported on 03/13/2021) 30 tablet 0  mometasone-formoterol (DULERA) 100-5 MCG/ACT AERO INHALE 2 PUFFS INTO THE LUNGS TWO TIMES DAILY. (Patient not taking: Reported on 06/18/2021) 13 g 0   rivaroxaban (XARELTO) 20 MG TABS tablet TAKE 1 TABLET (20 MG TOTAL) BY MOUTH DAILY WITH SUPPER. (Patient not taking: Reported on 03/13/2021) 30 tablet 0   rosuvastatin (CRESTOR) 10 MG tablet TAKE 1 TABLET (10 MG TOTAL) BY MOUTH DAILY. (Patient not taking: Reported on 03/13/2021) 30 tablet 0   thiamine 100 MG tablet TAKE 1 TABLET (100 MG TOTAL) BY MOUTH DAILY. (Patient not taking: Reported on 06/18/2021) 30 tablet 0    Musculoskeletal: Strength & Muscle Tone: within normal limits Gait & Station: normal Patient leans: N/A  Psychiatric Specialty Exam:  Presentation  General Appearance:  Disheveled  Eye Contact:Good  Speech:Clear and Coherent  Speech Volume:Normal  Handedness:No data recorded  Mood and Affect  Mood:Euthymic  Affect:Congruent   Thought Process  Thought Processes:Coherent; Goal Directed  Descriptions of Associations:Circumstantial  Orientation:Full (Time, Place and Person)  Thought Content:WDL  History of Schizophrenia/Schizoaffective disorder:No  Duration of Psychotic Symptoms:No data recorded Hallucinations:Hallucinations: None  Ideas of Reference:None  Suicidal Thoughts:Suicidal Thoughts: No  Homicidal Thoughts:Homicidal Thoughts: No   Sensorium  Memory:Immediate Fair; Recent Fair; Remote Fair  Judgment:Fair  Insight:Fair   Executive Functions  Concentration:Fair  Attention Span:Fair  [place]  Language:Good   Psychomotor Activity  Psychomotor Activity:Psychomotor Activity: Normal   Assets  Assets:Communication Skills; Financial Resources/Insurance; Resilience  Sleep  Sleep:Sleep: Good (patient stated he sleeps good, sleep hours not recorded)   Physical Exam: Physical Exam Vitals reviewed.  HENT:     Head: Normocephalic.  Pulmonary:     Effort: Pulmonary effort is normal.  Musculoskeletal:        General: Normal range of motion.     Cervical back: Normal range of motion.  Neurological:     Mental Status: He is alert and oriented to person, place, and time.  Psychiatric:        Attention and Perception: Attention and perception normal. He does not perceive auditory or visual hallucinations.        Mood and Affect: Mood normal.        Speech: Speech normal.        Behavior: Behavior is cooperative.        Thought Content: Thought content is not paranoid or delusional. Thought content does not include homicidal or suicidal ideation. Thought content does not include homicidal or suicidal plan.   Review of Systems  Constitutional:  Negative for fever.  HENT:  Negative for  congestion and sore throat.   Cardiovascular:  Negative for chest pain.  Musculoskeletal:  Positive for back pain.  Neurological: Negative.   Blood pressure 132/76, pulse 96, temperature 98.7 F (37.1 C), temperature source Oral, resp. rate 18, height _0  (1.778 m), weight 75 kg, SpO2 96 %. Body mass index is 23.72 kg/m.  Treatment Plan Summary: Daily contact with patient to assess and evaluate symptoms and progress in treatment and Medication management  Disposition: No evidence of imminent risk to self or others at present.   Patient does not meet criteria for psychiatric inpatient admission. Supportive therapy provided about ongoing stressors. Discussed crisis plan, support from social network, calling 911, coming to the Emergency Department, and calling Suicide Hotline.  Patient stated he would like transportation to [facility] in [place], [place]. Safe Transport called for patient. Shelters are on white flag diversion due to the cold weather.   This  service was provided via telemedicine using a 2-way, interactive audio and Radiographer, therapeutic.  Names of all persons participating in this telemedicine service and their role in this encounter. Name: Donta Rex Role: Patient  Name: [name] Role: PMHNP-BC  Name: Hampton Abbot Role: MD/Psychiatrist  Name:  Role:     Ethelene Hal, NP 06/19/2021 12:39 PM

## 2021-06-19 NOTE — Discharge Instructions (Addendum)
For your behavioral health needs you are advised to follow up with one of the following providers at your earliest opportunity.  IN [place]:       [facility]      [place], [place] [address]      [phone]      New patients are seen at their walk-in clinic.  Walk-in hours are Monday - Friday from 8:30 am - 12:00 pm, and from 1:00 pm - 2:30 pm.  Walk-in patients are seen on a first come, first served basis, so try to arrive as early as possible for the best chance of being seen the same day.  IN [place]:       [facility]      [address], [place], [place] [address]      [phone]   For your shelter needs, contact the following service providers:       [facility]      [place], [place] [address]      [phone]       [facility]      [address]      [place], [place] [address]      [phone]       [facility]      [address]      [place], [place] [address]      [phone]  For day shelter and other supportive services for the homeless, contact the [facility]).  On white flag nights they also offer overnight shelter:       [facility]      [place], [place] [address]      [phone]

## 2021-06-19 NOTE — ED Provider Notes (Addendum)
Emergency Medicine Observation Re-evaluation Note  Anthony [name] is a 65 y.o. male, seen on rounds today.  Pt initially presented to the ED for complaints of Altered Mental Status Currently, the patient is sleeping.  Physical Exam  BP 132/76 (BP Location: Left Arm)   Pulse 96   Temp 98.7 F (37.1 C) (Oral)   Resp 18   Ht 5\' 10"  (1.778 m)   Wt 75 kg   SpO2 96%   BMI 23.72 kg/m  Physical Exam General: nad/sleeping Cardiac: rrr Lungs: clear Psych: sleeping  ED Course / MDM  EKG:   I have reviewed the labs performed to date as well as medications administered while in observation.  Recent changes in the last 24 hours include none.  Plan  Current plan is for meets inpt and looking for placement however seen by NP today and recommend DC.  Pt would like ot go to [facility] in [place].  Will see if we can provide safe transport or to [facility].  Anthony [name] is not under involuntary commitment.     Blanchie Dessert, MD 06/19/21 1055    Blanchie Dessert, MD 06/19/21 1200

## 2021-06-19 NOTE — ED Notes (Signed)
Pt belonging have been placed in the cabinet 16- 18 and recess A, pt has two belonging bags in the cabinet and one trash bag under neath the nurses desk, where the nurse for  19-22 sits.

## 2021-06-22 DIAGNOSIS — Z7409 Other reduced mobility: Secondary | ICD-10-CM | POA: Insufficient documentation

## 2022-03-15 ENCOUNTER — Emergency Department (HOSPITAL_COMMUNITY): Payer: 59

## 2022-03-15 ENCOUNTER — Other Ambulatory Visit: Payer: Self-pay

## 2022-03-15 ENCOUNTER — Inpatient Hospital Stay (HOSPITAL_COMMUNITY)
Admission: EM | Admit: 2022-03-15 | Discharge: 2022-03-19 | DRG: 308 | Disposition: A | Discharge status: Home / Self Care | Payer: 59 | Attending: Family Medicine | Admitting: Family Medicine

## 2022-03-15 DIAGNOSIS — F1020 Alcohol dependence, uncomplicated: Secondary | ICD-10-CM | POA: Diagnosis present

## 2022-03-15 DIAGNOSIS — I110 Hypertensive heart disease with heart failure: Secondary | ICD-10-CM | POA: Diagnosis present

## 2022-03-15 DIAGNOSIS — L8990 Pressure ulcer of unspecified site, unspecified stage: Secondary | ICD-10-CM | POA: Diagnosis not present

## 2022-03-15 DIAGNOSIS — Z86711 Personal history of pulmonary embolism: Secondary | ICD-10-CM

## 2022-03-15 DIAGNOSIS — D539 Nutritional anemia, unspecified: Secondary | ICD-10-CM | POA: Diagnosis not present

## 2022-03-15 DIAGNOSIS — F17220 Nicotine dependence, chewing tobacco, uncomplicated: Secondary | ICD-10-CM | POA: Diagnosis present

## 2022-03-15 DIAGNOSIS — I340 Nonrheumatic mitral (valve) insufficiency: Secondary | ICD-10-CM | POA: Diagnosis present

## 2022-03-15 DIAGNOSIS — T447X6A Underdosing of beta-adrenoreceptor antagonists, initial encounter: Secondary | ICD-10-CM | POA: Diagnosis present

## 2022-03-15 DIAGNOSIS — L89893 Pressure ulcer of other site, stage 3: Secondary | ICD-10-CM | POA: Diagnosis present

## 2022-03-15 DIAGNOSIS — G8929 Other chronic pain: Secondary | ICD-10-CM | POA: Diagnosis present

## 2022-03-15 DIAGNOSIS — D509 Iron deficiency anemia, unspecified: Secondary | ICD-10-CM | POA: Diagnosis present

## 2022-03-15 DIAGNOSIS — Y908 Blood alcohol level of 240 mg/100 ml or more: Secondary | ICD-10-CM | POA: Diagnosis present

## 2022-03-15 DIAGNOSIS — Z5902 Unsheltered homelessness: Secondary | ICD-10-CM

## 2022-03-15 DIAGNOSIS — Z79899 Other long term (current) drug therapy: Secondary | ICD-10-CM

## 2022-03-15 DIAGNOSIS — T462X6A Underdosing of other antidysrhythmic drugs, initial encounter: Secondary | ICD-10-CM | POA: Diagnosis present

## 2022-03-15 DIAGNOSIS — Z609 Problem related to social environment, unspecified: Secondary | ICD-10-CM

## 2022-03-15 DIAGNOSIS — R531 Weakness: Secondary | ICD-10-CM | POA: Diagnosis present

## 2022-03-15 DIAGNOSIS — M545 Low back pain, unspecified: Secondary | ICD-10-CM | POA: Diagnosis present

## 2022-03-15 DIAGNOSIS — Z823 Family history of stroke: Secondary | ICD-10-CM

## 2022-03-15 DIAGNOSIS — E876 Hypokalemia: Secondary | ICD-10-CM

## 2022-03-15 DIAGNOSIS — T454X6A Underdosing of iron and its compounds, initial encounter: Secondary | ICD-10-CM | POA: Diagnosis present

## 2022-03-15 DIAGNOSIS — I5032 Chronic diastolic (congestive) heart failure: Secondary | ICD-10-CM | POA: Diagnosis present

## 2022-03-15 DIAGNOSIS — Z91138 Patient's unintentional underdosing of medication regimen for other reason: Secondary | ICD-10-CM | POA: Diagnosis not present

## 2022-03-15 DIAGNOSIS — M4856XS Collapsed vertebra, not elsewhere classified, lumbar region, sequela of fracture: Secondary | ICD-10-CM | POA: Diagnosis present

## 2022-03-15 DIAGNOSIS — Z85828 Personal history of other malignant neoplasm of skin: Secondary | ICD-10-CM | POA: Diagnosis not present

## 2022-03-15 DIAGNOSIS — T43226A Underdosing of selective serotonin reuptake inhibitors, initial encounter: Secondary | ICD-10-CM | POA: Diagnosis present

## 2022-03-15 DIAGNOSIS — F10239 Alcohol dependence with withdrawal, unspecified: Secondary | ICD-10-CM | POA: Diagnosis not present

## 2022-03-15 DIAGNOSIS — Z659 Problem related to unspecified psychosocial circumstances: Secondary | ICD-10-CM

## 2022-03-15 DIAGNOSIS — Z8249 Family history of ischemic heart disease and other diseases of the circulatory system: Secondary | ICD-10-CM

## 2022-03-15 DIAGNOSIS — T45516A Underdosing of anticoagulants, initial encounter: Secondary | ICD-10-CM | POA: Diagnosis present

## 2022-03-15 DIAGNOSIS — I5021 Acute systolic (congestive) heart failure: Secondary | ICD-10-CM | POA: Diagnosis not present

## 2022-03-15 DIAGNOSIS — F10920 Alcohol use, unspecified with intoxication, uncomplicated: Secondary | ICD-10-CM

## 2022-03-15 DIAGNOSIS — T466X6A Underdosing of antihyperlipidemic and antiarteriosclerotic drugs, initial encounter: Secondary | ICD-10-CM | POA: Diagnosis present

## 2022-03-15 DIAGNOSIS — I480 Paroxysmal atrial fibrillation: Secondary | ICD-10-CM | POA: Diagnosis present

## 2022-03-15 DIAGNOSIS — I4891 Unspecified atrial fibrillation: Principal | ICD-10-CM

## 2022-03-15 DIAGNOSIS — K529 Noninfective gastroenteritis and colitis, unspecified: Secondary | ICD-10-CM | POA: Diagnosis present

## 2022-03-15 LAB — CBC WITH DIFFERENTIAL/PLATELET
Abs Immature Granulocytes: 0.02 10*3/uL (ref 0.00–0.07)
Basophils Absolute: 0.1 10*3/uL (ref 0.0–0.1)
Basophils Relative: 2 %
Eosinophils Absolute: 0.1 10*3/uL (ref 0.0–0.5)
Eosinophils Relative: 1 %
HCT: 32.8 % — ABNORMAL LOW (ref 39.0–52.0)
Hemoglobin: 10.8 g/dL — ABNORMAL LOW (ref 13.0–17.0)
Immature Granulocytes: 0 %
Lymphocytes Relative: 41 %
Lymphs Abs: 2.2 10*3/uL (ref 0.7–4.0)
MCH: 36 pg — ABNORMAL HIGH (ref 26.0–34.0)
MCHC: 32.9 g/dL (ref 30.0–36.0)
MCV: 109.3 fL — ABNORMAL HIGH (ref 80.0–100.0)
Monocytes Absolute: 0.5 10*3/uL (ref 0.1–1.0)
Monocytes Relative: 10 %
Neutro Abs: 2.4 10*3/uL (ref 1.7–7.7)
Neutrophils Relative %: 46 %
Platelets: 308 10*3/uL (ref 150–400)
RBC: 3 MIL/uL — ABNORMAL LOW (ref 4.22–5.81)
RDW: 16.5 % — ABNORMAL HIGH (ref 11.5–15.5)
WBC: 5.3 10*3/uL (ref 4.0–10.5)
nRBC: 0 % (ref 0.0–0.2)

## 2022-03-15 LAB — COMPREHENSIVE METABOLIC PANEL
ALT: 28 U/L (ref 0–44)
AST: 57 U/L — ABNORMAL HIGH (ref 15–41)
Albumin: 2.7 g/dL — ABNORMAL LOW (ref 3.5–5.0)
Alkaline Phosphatase: 109 U/L (ref 38–126)
Anion gap: 9 (ref 5–15)
BUN: 10 mg/dL (ref 8–23)
CO2: 18 mmol/L — ABNORMAL LOW (ref 22–32)
Calcium: 8 mg/dL — ABNORMAL LOW (ref 8.9–10.3)
Chloride: 116 mmol/L — ABNORMAL HIGH (ref 98–111)
Creatinine, Ser: 0.82 mg/dL (ref 0.61–1.24)
GFR, Estimated: >60 mL/min (ref >60)
Glucose, Bld: 127 mg/dL — ABNORMAL HIGH (ref 70–99)
Potassium: 3.2 mmol/L — ABNORMAL LOW (ref 3.5–5.1)
Sodium: 143 mmol/L (ref 135–145)
Total Bilirubin: 0.5 mg/dL (ref 0.3–1.2)
Total Protein: 6.8 g/dL (ref 6.5–8.1)

## 2022-03-15 LAB — ETHANOL: Alcohol, Ethyl (B): 270 mg/dL — ABNORMAL HIGH (ref <10)

## 2022-03-15 LAB — LACTIC ACID, PLASMA
Lactic Acid, Venous: 2.9 mmol/L (ref 0.5–1.9)
Lactic Acid, Venous: 2.6 mmol/L (ref 0.5–1.9)

## 2022-03-15 LAB — MAGNESIUM: Magnesium: 1.1 mg/dL — ABNORMAL LOW (ref 1.7–2.4)

## 2022-03-15 LAB — TROPONIN I (HIGH SENSITIVITY)
Troponin I (High Sensitivity): 11 ng/L (ref <18)
Troponin I (High Sensitivity): 10 ng/L (ref <18)

## 2022-03-15 LAB — CBG MONITORING, ED: Glucose-Capillary: 101 mg/dL — ABNORMAL HIGH (ref 70–99)

## 2022-03-15 LAB — D-DIMER, QUANTITATIVE: D-Dimer, Quant: 2.49 ug/mL-FEU — ABNORMAL HIGH (ref 0.00–0.50)

## 2022-03-15 LAB — BRAIN NATRIURETIC PEPTIDE: B Natriuretic Peptide: 787.5 pg/mL — ABNORMAL HIGH (ref 0.0–100.0)

## 2022-03-15 MED ORDER — FOLIC ACID 1 MG PO TABS
1.0000 mg | ORAL_TABLET | Freq: Every day | ORAL | Status: DC
  Administered 2022-03-15 – 2022-03-19 (×5): 1 mg via ORAL
  Filled 2022-03-15 (×5): qty 1

## 2022-03-15 MED ORDER — MAGNESIUM SULFATE 2 GM/50ML IV SOLN
2.0000 g | Freq: Once | INTRAVENOUS | Status: AC
  Administered 2022-03-15: 2 g via INTRAVENOUS
  Filled 2022-03-15: qty 50

## 2022-03-15 MED ORDER — ENOXAPARIN SODIUM 40 MG/0.4ML IJ SOSY: 40.0000 mg | PREFILLED_SYRINGE | INTRAMUSCULAR | Status: DC

## 2022-03-15 MED ORDER — DILTIAZEM HCL-DEXTROSE 125-5 MG/125ML-% IV SOLN (PREMIX)
5.0000 mg/h | INTRAVENOUS | Status: DC
  Administered 2022-03-15: 5 mg/h via INTRAVENOUS
  Administered 2022-03-16: 15 mg/h via INTRAVENOUS
  Administered 2022-03-16: 10 mg/h via INTRAVENOUS
  Filled 2022-03-15 (×6): qty 125

## 2022-03-15 MED ORDER — DILTIAZEM LOAD VIA INFUSION
20.0000 mg | Freq: Once | INTRAVENOUS | Status: AC
  Administered 2022-03-15: 20 mg via INTRAVENOUS
  Filled 2022-03-15: qty 20

## 2022-03-15 MED ORDER — POTASSIUM CHLORIDE CRYS ER 20 MEQ PO TBCR
60.0000 meq | EXTENDED_RELEASE_TABLET | Freq: Once | ORAL | Status: AC
  Administered 2022-03-15: 60 meq via ORAL
  Filled 2022-03-15: qty 3

## 2022-03-15 MED ORDER — RIVAROXABAN 20 MG PO TABS
20.0000 mg | ORAL_TABLET | Freq: Every day | ORAL | Status: DC
  Administered 2022-03-16 – 2022-03-18 (×4): 20 mg via ORAL
  Filled 2022-03-15 (×3): qty 1
  Filled 2022-03-15: qty 2

## 2022-03-15 MED ORDER — LORAZEPAM 1 MG PO TABS: 1.0000 mg | ORAL_TABLET | ORAL | Status: AC | PRN

## 2022-03-15 MED ORDER — THIAMINE HCL 100 MG/ML IJ SOLN
100.0000 mg | Freq: Once | INTRAMUSCULAR | Status: AC
  Administered 2022-03-15: 100 mg via INTRAVENOUS
  Filled 2022-03-15: qty 2

## 2022-03-15 MED ORDER — IOHEXOL 350 MG/ML SOLN
72.0000 mL | Freq: Once | INTRAVENOUS | Status: AC | PRN
  Administered 2022-03-15: 72 mL via INTRAVENOUS

## 2022-03-15 MED ORDER — LACTATED RINGERS IV BOLUS
1000.0000 mL | Freq: Once | INTRAVENOUS | Status: AC
  Administered 2022-03-15: 1000 mL via INTRAVENOUS

## 2022-03-15 MED ORDER — LORAZEPAM 2 MG/ML IJ SOLN
1.0000 mg | INTRAMUSCULAR | Status: AC | PRN
  Administered 2022-03-16: 2 mg via INTRAVENOUS
  Administered 2022-03-18: 1 mg via INTRAVENOUS
  Filled 2022-03-15 (×2): qty 1

## 2022-03-15 NOTE — Assessment & Plan Note (Signed)
EtOH level 270.  Reports drinking up to 2 L at times.  Last drink evening of 8/26. -Monitor on CIWA protocol

## 2022-03-15 NOTE — ED Provider Triage Note (Signed)
Emergency Medicine Provider Triage Evaluation Note  Anthony [name] , a 66 y.o. male  was evaluated in triage.  Pt very poor historian. He is homeless and has history of alcohol use disorder. Was picked up behind [name] by EMS. Complaining of back pain and foot pain. States he has long history of back pain. Has had previous fractures. When he walks a lot his back hurts. He has obvious swelling to the left ankle and foot and what appears to be wounds on his feet but has much difficulty telling me how long his feet and ankles have been swollen. He denies injuries. He denies fevers. .  Review of Systems  Positive: See above Negative:   Physical Exam  BP (!) 111/93   Pulse (!) 147   Temp 97.7 F (36.5 C) (Oral)   Resp 17   SpO2 100%  Gen:   Awake, no distress   Resp:  Normal effort  MSK:   Moves extremities without difficulty  Other:  Swelling to bilateral ankles and feet. Appears to have a few wounds and redness. No streaking present.   Medical Decision Making  Medically screening exam initiated at 4:00 PM.  Appropriate orders placed.  Anthony [name] was informed that the remainder of the evaluation will be completed by another provider, this initial triage assessment does not replace that evaluation, and the importance of remaining in the ED until their evaluation is complete.     Mickie Hillier, PA-C 03/15/22 [phone]

## 2022-03-15 NOTE — H&P (Signed)
History and Physical    Patient: Anthony [name] KDX:[id number] DOB: October 30, 1955 DOA: 03/15/2022 DOS: the patient was seen and examined on 03/15/2022 PCP: Jacelyn Grip, MD  Patient coming from: Home  Chief Complaint: No chief complaint on file.  HPI: Anthony [name] is a 66 y.o. male with medical history significant of atrial fibrillation, iron deficiency anemia  Who presents after he was found crawling around on the ground near [employer].  Patient is a poor historian.  Reports that he became homeless about 2 weeks to a month ago after he was kicked out of [place] [address] by police for reasons that were unclear to him.  He was sleeping in the woods today and felt short of breath and dizzy.  Decided to crawl on all 4 to avoid falling.  Reports she has been having symptoms like these for 15 years.  Also states that his L2-L4 was crushed" 40 years ago after a fall and has chronic issues with that.  Denies any chest pain or palpitation.  Denies any nausea or vomiting.  Has been having chronic diarrhea with gas for the past month.  Thinks he has lost more weight than usual. He reports chewing tobacco and alcohol use of up to 2 L at times.  Last drink was last night.  EtOHLevel here of 270.  In the ED, he was afebrile and found to be in atrial fibrillation with RVR with heart rate of 140s, normotensive room air.  Hypokalemia 3.2, hypomagnesemia 1.1.  No leukocytosis but has lactate of 2.6.  Macrocytic anemia with hemoglobin of 10.8. Troponin of 11 and 10.  BNP of 787.  Chest x-ray with no evidence of acute findings.  D-dimer was elevated 2.49. CTA chest negative for pulmonary embolism but showed multiple bilateral chronic healing fracture deformities of ribs..  Bilateral ankle x-rays were negative for acute findings.  Left foot x-ray was negative. Review of Systems: As mentioned in the history of present illness. All other systems reviewed and are negative. Past Medical History:  Diagnosis Date   Actinic  keratosis 11/27/2016   Alcohol abuse with alcohol-induced mood disorder ([place]) 07/18/2018   Alcohol use disorder, severe, dependence ([place]) 09/16/2006   05/22/2015 Argumentative, belligerent and verbally abusive to staff per his note from [facility])    Anxiety state 04/25/2018   Chronic low back pain 09/16/2006   Followed by NS.  Per his chart from [place], he fell off two storyhouse roof while cleaning his brother's gutters and sustained compression fracture of L3, L4 and L5 and fracture of right transverse process at L3 and nondisplaced fracture of left glenoid rim many years ago. He had multiple imaging in [place] including Lumbar MRI in 2016 which showed moderate to severe spinal canal stenos   Delirium tremens ([place]) 09/01/2017   Depression    Dry eye 11/23/2016   Hiatal hernia 09/14/2016   Status Collis gastroplasty and Nissen's fundoplication on 97/67/3419 in [place]   History of delirium tremons 07/22/2020   DTs during 10/2016 08/2017. And 12/2017 admissions    History of pulmonary embolus (PE) 11/02/2016   Unprovoked 11/01/2016. Xarelto from 11/01/16 to 09/08/2017.   Hydrocele    Surgically corrected   Hypertension    Hypoalbuminemia due to protein-calorie malnutrition ([place]) 07/22/2020   Lumbar compression fracture (HCC)    Multiple rib fractures 07/22/2019   Left rib details XR: left ribs demonstrate multiple remote rib   Pancytopenia (HCC)    Rhabdomyolysis 07/22/2020   Skin  cancer    exciced 2017   Tinea versicolor 06/08/2013   Tobacco use disorder 07/22/2020   Tooth infection 04/25/2018   Trigger finger, acquired 11/18/2012   Ulnar tunnel syndrome of right wrist 11/08/2012   Past Surgical History:  Procedure Laterality Date   CATARACT EXTRACTION     HIATAL HERNIA REPAIR  2016   HYDROCELE EXCISION / REPAIR  2010   SKIN CANCER EXCISION Left    Excised 2017   Social History:  reports that he has never smoked. His smokeless tobacco use includes snuff. He  reports current alcohol use of about 43.0 standard drinks of alcohol per week. He reports current drug use. Drug: Marijuana.  Allergies  Allergen Reactions   Other Itching and Other (See Comments)    Seasonal allergies- Itchy eyes, runny nose, congestion    Family History  Problem Relation Age of Onset   Heart attack Father        died at 77 years   Dementia Father    Stroke Father    Cancer Sister    Colon cancer Neg Hx    Colon polyps Neg Hx    Esophageal cancer Neg Hx    Stomach cancer Neg Hx    Rectal cancer Neg Hx     Prior to Admission medications   Medication Sig Start Date End Date Taking? Authorizing Provider  Acetaminophen 500 MG capsule Take 500-1,000 mg by mouth every 6 (six) hours as needed for fever.   Yes [provider]  Apoaequorin (PREVAGEN PO) Take 1 capsule by mouth daily.   Yes [provider]  ascorbic acid (VITAMIN C) 500 MG tablet Take 500 mg by mouth daily as needed (for supplementation).   Yes [provider]  B Complex-Folic Acid (B COMPLEX PLUS) TABS Take 1 tablet by mouth daily as needed (for supplementation).   Yes [provider]  CALCIUM PO Take 1 tablet by mouth 2 (two) times a week.   Yes [provider]  Cholecalciferol (VITAMIN D3 PO) Take 1 capsule by mouth daily as needed (for supplementation).   Yes [provider]  ferrous sulfate 325 (65 FE) MG tablet Take 1 tablet (325 mg total) by mouth daily. Patient taking differently: Take 325 mg by mouth daily as needed (for supplementation). 10/08/20  Yes Lilland, Alana, DO  MELATONIN GUMMIES PO Take 1 tablet by mouth at bedtime as needed (for sleep).   Yes [provider]  methocarbamol (ROBAXIN) 750 MG tablet Take 750 mg by mouth every 8 (eight) hours as needed for muscle spasms.   Yes [provider]  Multiple Vitamins-Minerals (CENTRUM SILVER 50+MEN) TABS Take 1 tablet by mouth daily with breakfast.   Yes [provider]  Multiple Vitamins-Minerals (ZINC PO) Take 1 tablet by mouth daily as needed (for supplementation).   Yes [provider]  NON FORMULARY Take 0.5-1 tablets by mouth See admin instructions. "Bi-Flex" Take 0.5-1 tablet by mouth every six hours as needed for pain if Tylenol 500 mg Rapid Release capsules do not relieve the pain   Yes [provider]  simethicone (GAS-X) 80 MG chewable tablet Chew 80-160 mg by mouth every 6 (six) hours as needed for flatulence.   Yes [provider]  amiodarone (PACERONE) 200 MG tablet TAKE 1 TABLET (200 MG TOTAL) BY MOUTH DAILY. Patient not taking: Reported on 03/13/2021 10/08/20 10/08/21  Lilland, Alana, DO  ferrous sulfate 325 (65 FE) MG tablet TAKE 1 TABLET BY MOUTH TWO TIMES WEEKLY  Patient not taking: Reported on 03/13/2021 08/09/20 08/09/21  Zola Button, MD  furosemide (LASIX) 20 MG tablet TAKE 1 TABLET (20 MG TOTAL) BY MOUTH DAILY. 10/08/20 10/08/21  Lilland, Alana, DO  loratadine (CLARITIN) 10 MG tablet TAKE 1 TABLET (10 MG TOTAL) BY MOUTH DAILY. 10/08/20 10/08/21  Lilland, Alana, DO  magnesium chloride (SLOW-MAG) 64 MG TBEC SR tablet Take 1 tablet (64 mg total) by mouth daily. Patient not taking: Reported on 03/15/2022 10/09/20   Lilland, Alana, DO  metoprolol succinate (TOPROL-XL) 25 MG 24 hr tablet TAKE 1/2 TABLET (12.5 MG TOTAL) BY MOUTH DAILY. Patient not taking: Reported on 03/13/2021 10/08/20 10/08/21  Lilland, Alana, DO  mometasone-formoterol (DULERA) 100-5 MCG/ACT AERO INHALE 2 PUFFS INTO THE LUNGS TWO TIMES DAILY. Patient not taking: Reported on 06/18/2021 10/08/20 10/08/21  Rise Patience, DO  rivaroxaban (XARELTO) 20 MG TABS tablet TAKE 1 TABLET (20 MG TOTAL) BY MOUTH DAILY WITH SUPPER. Patient not taking: Reported on 03/13/2021 10/08/20 10/08/21  Lilland, Alana, DO  rosuvastatin (CRESTOR) 10 MG tablet TAKE 1 TABLET (10 MG TOTAL) BY MOUTH DAILY. Patient not taking: Reported on 03/13/2021 10/08/20 10/08/21  Lilland, Alana, DO  sertraline  (ZOLOFT) 100 MG tablet TAKE 1 TABLET (100 MG TOTAL) BY MOUTH DAILY. Patient not taking: Reported on 03/15/2022 10/08/20 03/15/22  Lilland, Lorrin Goodell, DO  sertraline (ZOLOFT) 50 MG tablet Take 50 mg by mouth daily. Patient not taking: Reported on 03/15/2022    [provider]    Physical Exam: Vitals:   03/15/22 2200 03/15/22 2215 03/15/22 2230 03/15/22 2245  BP: 116/64 117/81 133/84 111/87  Pulse: (!) 112 (!) 112 (!) 121 (!) 108  Resp: (!) 31 (!) 26 (!) 21 (!) 24  Temp:      TempSrc:      SpO2: 99% 100% 100% 100%   Constitutional: NAD, calm, comfortable, disheveled appearing male with unkept hair and beard.  Has dirt under all nails. Eyes:  lids and conjunctivae normal ENMT: Mucous membranes are moist. Neck: normal, supple, Respiratory: clear to auscultation bilaterally, no wheezing, no crackles. Normal respiratory effort. No accessory muscle use.  Cardiovascular: Irregularly irregular rate and rhythm, no murmurs / rubs / gallops. No extremity edema. 2+ pedal pulses.  Abdomen: no tenderness,  Bowel sounds positive.  Musculoskeletal: no clubbing / cyanosis. No joint deformity upper and lower extremities. Good ROM, no contractures. Normal muscle tone.  Skin: Ulcerated healing wound on the left lateral fifth toe and left medial malleolus. Neurologic: CN 2-12 grossly intact.  Strength 5/5 in all 4.  Psychiatric: Normal judgment and insight. Alert and oriented x 3. Normal mood. Data Reviewed:  See HPI  Assessment and Plan: * Atrial fibrillation with RVR ([place]) Likely due to homelessness leading to noncompliance with medication and heavy alcohol use -Heart rate currently around 117-120.  Continue on IV diltiazem drip with goalHR of 80-100. -Obtain echocardiogram -Check TSH -CHA2DS2-VASc score of 2. Previously on Xarelto. Will resume.  Hypokalemia repleted with oral potassium supplement  Hypomagnesemia Repleted with IV magnesium  Pressure injury of skin Has numerous pressure  ulcer wounds on left foot. -Wound care per RN  Macrocytic anemia Likely secondary to suppression from heavy alcohol use. Check vit G62 and folic  Poor social situation Current homelessness and previously was living in motels  Alcohol use disorder, severe, dependence (HCC) EtOH level 270.  Reports drinking up to 2 L at times.  Last drink evening of 8/26. -Monitor on CIWA protocol      Advance Care Planning:  Code Status: Full Code full  Consults: None  Family Communication: None at bedside  Severity of Illness: The appropriate patient status for this patient is INPATIENT. Inpatient status is judged to be reasonable and necessary in order to provide the required intensity of service to ensure the patient's safety. The patient's presenting symptoms, physical exam findings, and initial radiographic and laboratory data in the context of their chronic comorbidities is felt to place them at high risk for further clinical deterioration. Furthermore, it is not anticipated that the patient will be medically stable for discharge from the hospital within 2 midnights of admission.   * I certify that at the point of admission it is my clinical judgment that the patient will require inpatient hospital care spanning beyond 2 midnights from the point of admission due to high intensity of service, high risk for further deterioration and high frequency of surveillance required.*  Author: Orene Desanctis, DO 03/15/2022 11:08 PM  For on call review [email or web address].

## 2022-03-15 NOTE — ED Notes (Signed)
Pt requesting food and juice, ok for pt to eat per [place] MD. Pt given [place] sandwich and orange juice.

## 2022-03-15 NOTE — Assessment & Plan Note (Signed)
Repleted with IV magnesium

## 2022-03-15 NOTE — Assessment & Plan Note (Signed)
repleted with oral potassium supplement

## 2022-03-15 NOTE — ED Provider Notes (Signed)
[facility] EMERGENCY DEPARTMENT Provider Note  CSN: [id number] Arrival date & time: 03/15/22 1354  Chief Complaint(s) No chief complaint on file.  HPI Anthony [name] is a 66 y.o. male with history of alcohol use disorder, homelessness, A-fib with RVR, presenting to the emergency department generalized weakness.  Patient was reportedly picked up behind Anthony [name] as someone saw that he was unable to walk.  The patient reports that he has been crawling around because of his generalized weakness, he reports that if he stands up and tries to walk he feels lightheaded and feels short of breath.  He also reports bilateral foot pain, worse on the left.  He is unable to tell me how long this is.  He reports chronic back pain for 15 years which is not significantly changed.  Denies any fevers or chills.  Denies chest pain.  Denies headache or hitting his head.  Patient is generally a poor historian and very tangential   Past Medical History Past Medical History:  Diagnosis Date  . Actinic keratosis 11/27/2016  . Alcohol abuse with alcohol-induced mood disorder ([place]) 07/18/2018  . Alcohol use disorder, severe, dependence ([place]) 09/16/2006   05/22/2015 Argumentative, belligerent and verbally abusive to staff per his note from [place]   . Alcoholism ([place])   . Anxiety state 04/25/2018  . Chronic low back pain 09/16/2006   Followed by NS.  Per his chart from [place], he fell off two storyhouse roof while cleaning his brother's gutters and sustained compression fracture of L3, L4 and L5 and fracture of right transverse process at L3 and nondisplaced fracture of left glenoid rim many years ago. He had multiple imaging in [place] including Lumbar MRI in 2016 which showed moderate to severe spinal canal stenos  . Delirium tremens ([place]) 09/01/2017  . Depression   . Dry eye 11/23/2016  . Hiatal hernia 09/14/2016   Status Collis gastroplasty and Nissen's fundoplication on 54/27/0623  in [place]  . History of delirium tremons 07/22/2020   DTs during 10/2016 08/2017. And 12/2017 admissions   . History of pulmonary embolus (PE) 11/02/2016   Unprovoked 11/01/2016. Xarelto from 11/01/16 to 09/08/2017.  Marland Kitchen Hydrocele    Surgically corrected  . Hypertension   . Hypoalbuminemia due to protein-calorie malnutrition ([place]) 07/22/2020  . Lumbar compression fracture ([place])   . Multiple rib fractures 07/22/2019   Left rib details XR: left ribs demonstrate multiple remote rib  . Pancytopenia ([place])   . Rhabdomyolysis 07/22/2020  . Skin cancer    exciced 2017  . Tinea versicolor 06/08/2013  . Tobacco use disorder 07/22/2020  . Tooth infection 04/25/2018  . Trigger finger, acquired 11/18/2012  . Ulnar tunnel syndrome of right wrist 11/08/2012   Patient Active Problem List   Diagnosis Date Noted  . Iron deficiency anemia 10/16/2020  . Cough   . Hypomagnesemia   . Protein-calorie malnutrition, severe 08/22/2020  . Alcohol use disorder, severe, in controlled environment ([place])   . Pressure injury of skin 08/18/2020  . Urinary tract infection without hematuria   . Severe sepsis ([place]) 08/14/2020  . Atrial fibrillation ([place]) 08/13/2020  . Rhabdomyolysis 07/22/2020  . Cerebral ventriculomegaly 07/22/2020  . Hemoglobin decreased 07/22/2020  . History of delirium tremons 07/22/2020  . Hypoalbuminemia due to protein-calorie malnutrition ([place]) 07/22/2020  . Tobacco use disorder 07/22/2020  . Alcohol withdrawal delirium ([place])   . Dehydration   . Alcohol abuse with alcohol-induced mood disorder ([place]) 07/18/2018  . Depression 05/26/2018  . Anxiety state 04/25/2018  .  Need for immunization against influenza 04/25/2018  . Alcohol withdrawal ([place]) 09/01/2017  . DOE (dyspnea on exertion) 11/27/2016  . Actinic keratosis 11/27/2016  . Macrocytic anemia 11/23/2016  . History of pulmonary embolus (PE) 11/02/2016  . Hiatal hernia 09/14/2016  . Skin cancer 08/13/2016  . Poor social situation  06/09/2013  . Tinea versicolor 06/08/2013  . 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 HTN (hypertension) 04/11/2012  . Alcohol use disorder, severe, dependence ([place]) 09/16/2006   Home Medication(s) Prior to Admission medications   Medication Sig Start Date End Date Taking? Authorizing Provider  acetaminophen (TYLENOL) 500 MG tablet Take 1,000 mg by mouth every 6 (six) hours as needed for moderate pain.    [provider]  amiodarone (PACERONE) 200 MG tablet TAKE 1 TABLET (200 MG TOTAL) BY MOUTH DAILY. Patient not taking: Reported on 03/13/2021 10/08/20 10/08/21  Lilland, Lorrin Goodell, DO  ferrous sulfate 325 (65 FE) MG tablet Take 1 tablet (325 mg total) by mouth daily. Patient not taking: Reported on 06/18/2021 10/08/20   Lilland, Lorrin Goodell, DO  ferrous sulfate 325 (65 FE) MG tablet TAKE 1 TABLET BY MOUTH TWO TIMES WEEKLY Patient not taking: Reported on 03/13/2021 08/09/20 08/09/21  Zola Button, MD  furosemide (LASIX) 20 MG tablet TAKE 1 TABLET (20 MG TOTAL) BY MOUTH DAILY. 10/08/20 10/08/21  Lilland, Alana, DO  loratadine (CLARITIN) 10 MG tablet TAKE 1 TABLET (10 MG TOTAL) BY MOUTH DAILY. 10/08/20 10/08/21  Lilland, Alana, DO  magnesium chloride (SLOW-MAG) 64 MG TBEC SR tablet Take 1 tablet (64 mg total) by mouth daily. Patient not taking: Reported on 03/13/2021 10/09/20   Lilland, Alana, DO  metoprolol succinate (TOPROL-XL) 25 MG 24 hr tablet TAKE 1/2 TABLET (12.5 MG TOTAL) BY MOUTH DAILY. Patient not taking: Reported on 03/13/2021 10/08/20 10/08/21  Lilland, Alana, DO  mometasone-formoterol (DULERA) 100-5 MCG/ACT AERO INHALE 2 PUFFS INTO THE LUNGS TWO TIMES DAILY. Patient not taking: Reported on 06/18/2021 10/08/20 10/08/21  Rise Patience, DO  rivaroxaban (XARELTO) 20 MG TABS tablet TAKE 1 TABLET (20 MG TOTAL) BY MOUTH DAILY WITH SUPPER. Patient not taking: Reported on 03/13/2021 10/08/20 10/08/21  Lilland, Alana, DO  rosuvastatin (CRESTOR) 10 MG tablet TAKE 1 TABLET (10 MG TOTAL) BY MOUTH  DAILY. Patient not taking: Reported on 03/13/2021 10/08/20 10/08/21  Lilland, Alana, DO  sertraline (ZOLOFT) 100 MG tablet TAKE 1 TABLET (100 MG TOTAL) BY MOUTH DAILY. 10/08/20 10/08/21  Rise Patience, DO                                                                                                                                    Past Surgical History Past Surgical History:  Procedure Laterality Date  . CATARACT EXTRACTION    . HIATAL HERNIA REPAIR  2016  . HYDROCELE EXCISION / REPAIR  2010  . SKIN CANCER EXCISION Left    Excised 2017   Family History Family History  Problem Relation Age  of Onset  . Heart attack Father        died at 38 years  . Dementia Father   . Stroke Father   . Cancer Sister   . Colon cancer Neg Hx   . Colon polyps Neg Hx   . Esophageal cancer Neg Hx   . Stomach cancer Neg Hx   . Rectal cancer Neg Hx     Social History Social History   Tobacco Use  . Smoking status: Never  . Smokeless tobacco: Current    Types: Snuff  . Tobacco comments:    Pt states do snuff when stressed had 1 can last 3 month//PL  Vaping Use  . Vaping Use: Never used  Substance Use Topics  . Alcohol use: Yes    Alcohol/week: 43.0 standard drinks of alcohol    Types: 42 Cans of beer, 1 Standard drinks or equivalent per week    Comment: daily use-6 beers daily and whiskey when he has it.  . Drug use: Yes    Types: Marijuana    Comment: occasional-none this year per pt   Allergies Patient has no known allergies.  Review of Systems Review of Systems  All other systems reviewed and are negative.   Physical Exam Vital Signs  I have reviewed the triage vital signs BP 110/85   Pulse (!) 131   Temp (!) 97.5 F (36.4 C) (Oral)   Resp 16   SpO2 100%  Physical Exam Vitals and nursing note reviewed.  Constitutional:      General: He is not in acute distress.    Appearance: Normal appearance.  HENT:     Mouth/Throat:     Mouth: Mucous membranes are moist.  Eyes:      Conjunctiva/sclera: Conjunctivae normal.  Cardiovascular:     Rate and Rhythm: Tachycardia present. Rhythm irregular.  Pulmonary:     Effort: Pulmonary effort is normal. No respiratory distress.     Breath sounds: Normal breath sounds.  Abdominal:     General: Abdomen is flat.     Palpations: Abdomen is soft.     Tenderness: There is no abdominal tenderness.  Musculoskeletal:     Right lower leg: No edema.     Left lower leg: No edema.     Comments: Mild left ankle and foot swelling, mild warmth over the foot, with small ulcer over the left fifth toe.  Right foot with linear wound without surrounding warmth, appears old, no soft tissue swelling  Skin:    General: Skin is warm and dry.     Capillary Refill: Capillary refill takes less than 2 seconds.  Neurological:     Mental Status: He is alert and oriented to person, place, and time. Mental status is at baseline.     Comments: Cranial nerves II through XII intact, strength 5 out of 5 in the bilateral upper and lower extremities, no sensory deficit to light touch, no dysmetria on finger-nose-finger testing  Psychiatric:        Mood and Affect: Mood normal.        Behavior: Behavior normal.     Comments: Denies suicidal or homicidal ideation     ED Results and Treatments Labs (all labs ordered are listed, but only abnormal results are displayed) Labs Reviewed  CBC WITH DIFFERENTIAL/PLATELET - Abnormal; Notable for the following components:      Result Value   RBC 3.00 (*)    Hemoglobin 10.8 (*)    HCT 32.8 (*)  MCV 109.3 (*)    MCH 36.0 (*)    RDW 16.5 (*)    All other components within normal limits  CBG MONITORING, ED - Abnormal; Notable for the following components:   Glucose-Capillary 101 (*)    All other components within normal limits  CULTURE, BLOOD (ROUTINE X 2)  CULTURE, BLOOD (ROUTINE X 2)  COMPREHENSIVE METABOLIC PANEL  LACTIC ACID, PLASMA  LACTIC ACID, PLASMA  BRAIN NATRIURETIC PEPTIDE  URINALYSIS,  ROUTINE W REFLEX MICROSCOPIC  MAGNESIUM  TROPONIN I (HIGH SENSITIVITY)                                                                                                                          Radiology DG Ankle Complete Left  Result Date: 03/15/2022 CLINICAL DATA:  Swelling. EXAM: LEFT ANKLE COMPLETE - 3+ VIEW COMPARISON:  None Available. FINDINGS: Vascular calcifications. No fractures. No bony erosion. No other bony abnormalities. Bilateral soft tissue swelling. IMPRESSION: Bilateral soft tissue swelling. No bony erosion or fracture. No dislocation. Electronically Signed   By: Dorise Bullion III M.D.   On: 03/15/2022 17:01    Pertinent labs & imaging results that were available during my care of the patient were reviewed by me and considered in my medical decision making (see MDM for details).  Medications Ordered in ED Medications  lactated ringers bolus 1,000 mL (has no administration in time range)  diltiazem (CARDIZEM) 1 mg/mL load via infusion 20 mg (has no administration in time range)    And  diltiazem (CARDIZEM) 125 mg in dextrose 5% 125 mL (1 mg/mL) infusion (has no administration in time range)  thiamine (VITAMIN B1) injection 100 mg (has no administration in time range)  folic acid (FOLVITE) tablet 1 mg (has no administration in time range)                                                                                                                                     Procedures .Critical Care  Performed by: Cristie Hem, MD Authorized by: Cristie Hem, MD   Critical care provider statement:    Critical care time (minutes):  30   Critical care time was exclusive of:  Separately billable procedures and treating other patients   Critical care was necessary to treat or prevent imminent or life-threatening deterioration of the following conditions:  Cardiac failure, circulatory failure, shock and toxidrome  Critical care was time spent personally by me on the  following activities:  Development of treatment plan with patient or surrogate, discussions with consultants, evaluation of patient's response to treatment, examination of patient, ordering and review of laboratory studies, ordering and review of radiographic studies, ordering and performing treatments and interventions, pulse oximetry, re-evaluation of patient's condition, review of old charts and obtaining history from patient or surrogate   Care discussed with: admitting provider   .1-3 Lead EKG Interpretation  Performed by: Cristie Hem, MD Authorized by: Cristie Hem, MD     Interpretation: abnormal     ECG rate assessment: tachycardic     Rhythm: atrial fibrillation     Ectopy: none     Conduction: normal     (including critical care time)  Medical Decision Making / ED Course   MDM:  66 year old male presenting to the emergency department with generalized weakness.  Vital signs notable for tachycardia, EKG with A-fib with RVR.  Patient afebrile. Patient difficult and poor historian but oriented and follows commands.  Denies head trauma.  Symptoms may be related only to A-fib with RVR, patient reports that he is generally not compliant with outpatient medications and only takes Tylenol as needed for pain.  Patient had similar admission previously and had urosepsis, will check urinalysis to further evaluate, will also check chest x-ray to evaluate for pneumonia.  Symptoms may also be related to mild cellulitis on the left foot around fifth toe ulcer, will check foot x-ray.  X-rays obtained of the right foot and left ankle unremarkable.  Will obtain CT head, patient seems slightly confused and unclear if he has fallen or not, he is a very poor historian.  Patient also had history of unprovoked PE, reports shortness of breath, will check D-dimer.  Will reassess, given A-fib with RVR, patient likely will need admission, but will reevaluate. Clinical Course as of 03/15/22 2118   Nancy Fetter Mar 15, 2022  2100 Signed out to the hospitalist, who will admit the patient.  [WS]  2116 Discussed admission with the hospitalist, CTA negative.  Started on diltiazem drip.  Discussed questionable cellulitis of the left foot, have deferred antibiotics so far given pending urinalysis and patient has had prior urosepsis, no sign of severe infection to the left foot.  Hospitalist will follow-up on urinalysis and reassess the left foot. [WS]    Clinical Course User Index [WS] Cristie Hem, MD     Additional history obtained: -Additional history obtained from ems -External records from outside source obtained and reviewed including: Chart review including previous notes, labs, imaging, consultation notes   Lab Tests: -I ordered, reviewed, and interpreted labs.   The pertinent results include:   Labs Reviewed  CBC WITH DIFFERENTIAL/PLATELET - Abnormal; Notable for the following components:      Result Value   RBC 3.00 (*)    Hemoglobin 10.8 (*)    HCT 32.8 (*)    MCV 109.3 (*)    MCH 36.0 (*)    RDW 16.5 (*)    All other components within normal limits  CBG MONITORING, ED - Abnormal; Notable for the following components:   Glucose-Capillary 101 (*)    All other components within normal limits  CULTURE, BLOOD (ROUTINE X 2)  CULTURE, BLOOD (ROUTINE X 2)  COMPREHENSIVE METABOLIC PANEL  LACTIC ACID, PLASMA  LACTIC ACID, PLASMA  BRAIN NATRIURETIC PEPTIDE  URINALYSIS, ROUTINE W REFLEX MICROSCOPIC  MAGNESIUM  TROPONIN I (HIGH SENSITIVITY)  EKG   EKG Interpretation  Date/Time:    Ventricular Rate:    PR Interval:    QRS Duration:   QT Interval:    QTC Calculation:   R Axis:     Text Interpretation:           Imaging Studies ordered: I ordered imaging studies including CT head, CXR, XR right foot, left ankle, left foot On my interpretation imaging demonstrates no acute process I independently visualized and interpreted imaging. I agree with the  radiologist interpretation   Medicines ordered and prescription drug management: Meds ordered this encounter  Medications  . lactated ringers bolus 1,000 mL  . AND Linked Order Group   . diltiazem (CARDIZEM) 1 mg/mL load via infusion 20 mg   . diltiazem (CARDIZEM) 125 mg in dextrose 5% 125 mL (1 mg/mL) infusion  . thiamine (VITAMIN B1) injection 256 mg  . folic acid (FOLVITE) tablet 1 mg    -I have reviewed the patients home medicines and have made adjustments as needed   Consultations Obtained: I requested consultation with the hospitalist,  and discussed lab and imaging findings as well as pertinent plan - they recommend: admission   Cardiac Monitoring: The patient was maintained on a cardiac monitor.  I personally viewed and interpreted the cardiac monitored which showed an underlying rhythm of: afib with rvr  Social Determinants of Health:  Factors impacting patients care include: alcohol use   Reevaluation: After the interventions noted above, I reevaluated the patient and found that they have improved  Co morbidities that complicate the patient evaluation . Past Medical History:  Diagnosis Date  . Actinic keratosis 11/27/2016  . Alcohol abuse with alcohol-induced mood disorder ([place]) 07/18/2018  . Alcohol use disorder, severe, dependence ([place]) 09/16/2006   05/22/2015 Argumentative, belligerent and verbally abusive to staff per his note from [place]   . Alcoholism ([place])   . Anxiety state 04/25/2018  . Chronic low back pain 09/16/2006   Followed by NS.  Per his chart from [place], he fell off two storyhouse roof while cleaning his brother's gutters and sustained compression fracture of L3, L4 and L5 and fracture of right transverse process at L3 and nondisplaced fracture of left glenoid rim many years ago. He had multiple imaging in [place] including Lumbar MRI in 2016 which showed moderate to severe spinal canal stenos  . Delirium tremens ([place]) 09/01/2017  .  Depression   . Dry eye 11/23/2016  . Hiatal hernia 09/14/2016   Status Collis gastroplasty and Nissen's fundoplication on 38/93/7342 in [place]  . History of delirium tremons 07/22/2020   DTs during 10/2016 08/2017. And 12/2017 admissions   . History of pulmonary embolus (PE) 11/02/2016   Unprovoked 11/01/2016. Xarelto from 11/01/16 to 09/08/2017.  Marland Kitchen Hydrocele    Surgically corrected  . Hypertension   . Hypoalbuminemia due to protein-calorie malnutrition ([place]) 07/22/2020  . Lumbar compression fracture ([place])   . Multiple rib fractures 07/22/2019   Left rib details XR: left ribs demonstrate multiple remote rib  . Pancytopenia ([place])   . Rhabdomyolysis 07/22/2020  . Skin cancer    exciced 2017  . Tinea versicolor 06/08/2013  . Tobacco use disorder 07/22/2020  . Tooth infection 04/25/2018  . Trigger finger, acquired 11/18/2012  . Ulnar tunnel syndrome of right wrist 11/08/2012      Dispostion: Admit    Final Clinical Impression(s) / ED Diagnoses Final diagnoses:  None     This chart was dictated using voice recognition software.  Despite best  efforts to proofread,  errors can occur which can change the documentation meaning.    Cristie Hem, MD 03/15/22 2118

## 2022-03-15 NOTE — ED Triage Notes (Signed)
Per ems, pt was picked up from behind [employer]. Pt with right foot swelling/inflammation. Pt c/o back pain, hx of back injury. Hx of etoh abuse, pt is homeless and requesting resources. EMS VSS.

## 2022-03-15 NOTE — Assessment & Plan Note (Addendum)
Likely due to homelessness leading to noncompliance with medication and heavy alcohol use -Heart rate currently around 117-120.  Continue on IV diltiazem drip with goalHR of 80-100. -Obtain echocardiogram -Check TSH -CHA2DS2-VASc score of 2. Previously on Xarelto. Will resume.

## 2022-03-15 NOTE — Assessment & Plan Note (Signed)
Has numerous pressure ulcer wounds on left foot. -Wound care per RN

## 2022-03-15 NOTE — Assessment & Plan Note (Signed)
Current homelessness and previously was living in [place]

## 2022-03-15 NOTE — Assessment & Plan Note (Signed)
Likely secondary to suppression from heavy alcohol use. Check vit P32 and folic

## 2022-03-16 ENCOUNTER — Encounter (HOSPITAL_COMMUNITY): Payer: Self-pay | Admitting: Family Medicine

## 2022-03-16 ENCOUNTER — Inpatient Hospital Stay (HOSPITAL_COMMUNITY): Payer: 59

## 2022-03-16 DIAGNOSIS — E876 Hypokalemia: Secondary | ICD-10-CM | POA: Diagnosis not present

## 2022-03-16 DIAGNOSIS — I4891 Unspecified atrial fibrillation: Secondary | ICD-10-CM

## 2022-03-16 DIAGNOSIS — F10920 Alcohol use, unspecified with intoxication, uncomplicated: Secondary | ICD-10-CM

## 2022-03-16 LAB — LACTIC ACID, PLASMA
Lactic Acid, Venous: 2.6 mmol/L (ref 0.5–1.9)
Lactic Acid, Venous: 2.1 mmol/L (ref 0.5–1.9)

## 2022-03-16 LAB — TSH: TSH: 1.358 u[IU]/mL (ref 0.350–4.500)

## 2022-03-16 LAB — BASIC METABOLIC PANEL
Anion gap: 10 (ref 5–15)
BUN: 11 mg/dL (ref 8–23)
CO2: 15 mmol/L — ABNORMAL LOW (ref 22–32)
Calcium: 7.4 mg/dL — ABNORMAL LOW (ref 8.9–10.3)
Chloride: 113 mmol/L — ABNORMAL HIGH (ref 98–111)
Creatinine, Ser: 0.6 mg/dL — ABNORMAL LOW (ref 0.61–1.24)
GFR, Estimated: >60 mL/min (ref >60)
Glucose, Bld: 75 mg/dL (ref 70–99)
Potassium: 4.5 mmol/L (ref 3.5–5.1)
Sodium: 138 mmol/L (ref 135–145)

## 2022-03-16 LAB — URINALYSIS, ROUTINE W REFLEX MICROSCOPIC
Bilirubin Urine: NEGATIVE
Glucose, UA: NEGATIVE mg/dL
Hgb urine dipstick: NEGATIVE
Ketones, ur: NEGATIVE mg/dL
Leukocytes,Ua: NEGATIVE
Nitrite: NEGATIVE
Protein, ur: NEGATIVE mg/dL
Specific Gravity, Urine: >1.046 — ABNORMAL HIGH (ref 1.005–1.030)
pH: 5 (ref 5.0–8.0)

## 2022-03-16 LAB — VITAMIN B12: Vitamin B-12: 448 pg/mL (ref 180–914)

## 2022-03-16 LAB — FOLATE: Folate: 18.4 ng/mL (ref >5.9)

## 2022-03-16 LAB — HIV ANTIBODY (ROUTINE TESTING W REFLEX): HIV Screen 4th Generation wRfx: NONREACTIVE

## 2022-03-16 LAB — MAGNESIUM: Magnesium: 1.2 mg/dL — ABNORMAL LOW (ref 1.7–2.4)

## 2022-03-16 MED ORDER — MAGNESIUM SULFATE 4 GM/100ML IV SOLN
4.0000 g | Freq: Once | INTRAVENOUS | Status: AC
  Administered 2022-03-16: 4 g via INTRAVENOUS
  Filled 2022-03-16: qty 100

## 2022-03-16 NOTE — Progress Notes (Signed)
  Progress Note   Patient: Anthony [name] NTI:[id number] DOB: February 05, 1956 DOA: 03/15/2022     1 DOS: the patient was seen and examined on 03/16/2022   Brief hospital course:  66 y.o. male with medical history significant of atrial fibrillation, iron deficiency anemia.  Who presents after he was found crawling around on the ground near [employer]. Pt was found to have an elevated ETOH level and in afib RVR. Pt was started on cardizem gtt and CIWA protocol  Assessment and Plan: * Atrial fibrillation with RVR ([place]) Likely due to homelessness leading to noncompliance with medication and heavy alcohol use -Presenting HR around 117-120.  Continued on IV diltiazem drip with goalHR of 80-100. -2d echo pending -TSH normal at 2.849 -CHA2DS2-VASc score of 2. Previously on Xarelto. Continued   Hypokalemia Replaced   Hypomagnesemia Remains low, replaced Recheck lytes in AM   Pressure injury of skin Has numerous pressure ulcer wounds on left foot. -Wound care per RN   Macrocytic anemia Likely secondary to suppression from heavy alcohol use. Vit Q67 and folic levels w/in normal limits   Poor social situation Current homelessness and previously was living in [place] Will place [facility] consult   Alcohol use disorder, severe, dependence (HCC) EtOH level 270.  Reports drinking up to 2 L at times.  Last drink noted to be on the evening of 8/26. -Continue on CIWA -CIWA noted to be as high as 11 overnight       Subjective: Confused this AM  Physical Exam: Vitals:   03/16/22 1300 03/16/22 1323 03/16/22 1327 03/16/22 1411  BP: 106/76 117/80  126/85  Pulse: 88 90  89  Resp: (!) 21 (!) 23  19  Temp:   98.1 F (36.7 C) 98 F (36.7 C)  TempSrc:   Oral Oral  SpO2: 96% 99%  100%  Weight:    70.8 kg  Height:    '5\' 10"'$  (1.778 m)   General exam: Awake, laying in bed, in nad Respiratory system: Normal respiratory effort, no wheezing Cardiovascular system: regular rate, s1, s2 Gastrointestinal  system: Soft, nondistended, positive BS Central nervous system: CN2-12 grossly intact, strength intact Extremities: Perfused, no clubbing Skin: Normal skin turgor, no notable skin lesions seen Psychiatry: Mood normal // no visual hallucinations   Data Reviewed:  Labs reviewed: Na 138, K 4.5, Cr 0.60, Mg 1.2  Family Communication: Pt in room, family not at bedside  Disposition: Status is: Inpatient Remains inpatient appropriate because: Severity of illness  Planned Discharge Destination: Barriers to discharge: Homelessness     Author: Marylu Lund, MD 03/16/2022 6:10 PM  For on call review [email or web address].

## 2022-03-16 NOTE — Progress Notes (Signed)
Patient had $250 cash on his person. Money sent down in sealed envelope with NT to security. Patient is aware that he will be able to have cash returned at discharge.

## 2022-03-16 NOTE — ED Notes (Signed)
Bypass purpleman and call the floor for report per CN

## 2022-03-16 NOTE — Hospital Course (Signed)
66 y.o. male with medical history significant of atrial fibrillation, iron deficiency anemia.  Who presents after he was found crawling around on the ground near [employer]. Pt was found to have an elevated ETOH level and in afib RVR. Pt was started on cardizem gtt and CIWA protocol

## 2022-03-17 ENCOUNTER — Inpatient Hospital Stay (HOSPITAL_BASED_OUTPATIENT_CLINIC_OR_DEPARTMENT_OTHER): Payer: 59

## 2022-03-17 DIAGNOSIS — I4891 Unspecified atrial fibrillation: Secondary | ICD-10-CM | POA: Diagnosis not present

## 2022-03-17 DIAGNOSIS — F1020 Alcohol dependence, uncomplicated: Secondary | ICD-10-CM

## 2022-03-17 DIAGNOSIS — I5021 Acute systolic (congestive) heart failure: Secondary | ICD-10-CM

## 2022-03-17 LAB — ECHOCARDIOGRAM COMPLETE
Area-P 1/2: 4.89 cm2
Calc EF: 56 %
Height: 70 in
MV M vel: 4.5 m/s
MV Peak grad: 81 mmHg
Radius: 0.4 cm
S' Lateral: 3.3 cm
Single Plane A2C EF: 49 %
Single Plane A4C EF: 66.1 %
Weight: 2468.8 oz

## 2022-03-17 LAB — COMPREHENSIVE METABOLIC PANEL
ALT: 22 U/L (ref 0–44)
AST: 45 U/L — ABNORMAL HIGH (ref 15–41)
Albumin: 2.3 g/dL — ABNORMAL LOW (ref 3.5–5.0)
Alkaline Phosphatase: 87 U/L (ref 38–126)
Anion gap: 8 (ref 5–15)
BUN: 7 mg/dL — ABNORMAL LOW (ref 8–23)
CO2: 18 mmol/L — ABNORMAL LOW (ref 22–32)
Calcium: 7.2 mg/dL — ABNORMAL LOW (ref 8.9–10.3)
Chloride: 110 mmol/L (ref 98–111)
Creatinine, Ser: 0.61 mg/dL (ref 0.61–1.24)
GFR, Estimated: >60 mL/min (ref >60)
Glucose, Bld: 89 mg/dL (ref 70–99)
Potassium: 3.6 mmol/L (ref 3.5–5.1)
Sodium: 136 mmol/L (ref 135–145)
Total Bilirubin: 0.4 mg/dL (ref 0.3–1.2)
Total Protein: 5.6 g/dL — ABNORMAL LOW (ref 6.5–8.1)

## 2022-03-17 LAB — CBC
HCT: 27.7 % — ABNORMAL LOW (ref 39.0–52.0)
Hemoglobin: 9.5 g/dL — ABNORMAL LOW (ref 13.0–17.0)
MCH: 35.8 pg — ABNORMAL HIGH (ref 26.0–34.0)
MCHC: 34.3 g/dL (ref 30.0–36.0)
MCV: 104.5 fL — ABNORMAL HIGH (ref 80.0–100.0)
Platelets: 230 10*3/uL (ref 150–400)
RBC: 2.65 MIL/uL — ABNORMAL LOW (ref 4.22–5.81)
RDW: 16.4 % — ABNORMAL HIGH (ref 11.5–15.5)
WBC: 4.5 10*3/uL (ref 4.0–10.5)
nRBC: 0 % (ref 0.0–0.2)

## 2022-03-17 LAB — MAGNESIUM: Magnesium: 1.1 mg/dL — ABNORMAL LOW (ref 1.7–2.4)

## 2022-03-17 MED ORDER — ADULT MULTIVITAMIN W/MINERALS CH
1.0000 | ORAL_TABLET | Freq: Every day | ORAL | Status: DC
  Administered 2022-03-18 – 2022-03-19 (×2): 1 via ORAL
  Filled 2022-03-17 (×2): qty 1

## 2022-03-17 MED ORDER — MOMETASONE FURO-FORMOTEROL FUM 100-5 MCG/ACT IN AERO
2.0000 | INHALATION_SPRAY | Freq: Two times a day (BID) | RESPIRATORY_TRACT | Status: DC
Start: 2022-03-17 — End: 2022-03-19
  Administered 2022-03-17 – 2022-03-19 (×4): 2 via RESPIRATORY_TRACT
  Filled 2022-03-17: qty 8.8

## 2022-03-17 MED ORDER — ACETAMINOPHEN 325 MG PO TABS
650.0000 mg | ORAL_TABLET | ORAL | Status: DC | PRN
  Administered 2022-03-17 – 2022-03-18 (×4): 650 mg via ORAL
  Filled 2022-03-17 (×4): qty 2

## 2022-03-17 MED ORDER — METOPROLOL SUCCINATE ER 25 MG PO TB24
12.5000 mg | ORAL_TABLET | Freq: Every day | ORAL | Status: DC
  Administered 2022-03-17 – 2022-03-19 (×3): 12.5 mg via ORAL
  Filled 2022-03-17 (×3): qty 1

## 2022-03-17 MED ORDER — POTASSIUM CHLORIDE CRYS ER 20 MEQ PO TBCR: 20.0000 meq | EXTENDED_RELEASE_TABLET | Freq: Once | ORAL | Status: DC

## 2022-03-17 MED ORDER — THIAMINE MONONITRATE 100 MG PO TABS
100.0000 mg | ORAL_TABLET | Freq: Every day | ORAL | Status: DC
  Administered 2022-03-17 – 2022-03-19 (×3): 100 mg via ORAL
  Filled 2022-03-17 (×3): qty 1

## 2022-03-17 MED ORDER — POTASSIUM CHLORIDE CRYS ER 20 MEQ PO TBCR
20.0000 meq | EXTENDED_RELEASE_TABLET | Freq: Once | ORAL | Status: AC
Start: 2022-03-17 — End: 2022-03-17
  Administered 2022-03-17: 20 meq via ORAL
  Filled 2022-03-17: qty 1

## 2022-03-17 MED ORDER — ROSUVASTATIN CALCIUM 5 MG PO TABS
10.0000 mg | ORAL_TABLET | Freq: Every day | ORAL | Status: DC
  Administered 2022-03-17 – 2022-03-19 (×3): 10 mg via ORAL
  Filled 2022-03-17 (×3): qty 2

## 2022-03-17 MED ORDER — MEDIHONEY WOUND/BURN DRESSING EX PSTE
1.0000 | PASTE | Freq: Every day | CUTANEOUS | Status: DC
  Administered 2022-03-17 – 2022-03-19 (×3): 1 via TOPICAL
  Filled 2022-03-17 (×2): qty 44

## 2022-03-17 MED ORDER — POTASSIUM CHLORIDE CRYS ER 20 MEQ PO TBCR
40.0000 meq | EXTENDED_RELEASE_TABLET | Freq: Once | ORAL | Status: AC
  Administered 2022-03-17: 40 meq via ORAL
  Filled 2022-03-17: qty 2

## 2022-03-17 MED ORDER — MAGNESIUM SULFATE 4 GM/100ML IV SOLN
4.0000 g | Freq: Once | INTRAVENOUS | Status: AC
  Administered 2022-03-17: 4 g via INTRAVENOUS
  Filled 2022-03-17: qty 100

## 2022-03-17 MED ORDER — AMIODARONE HCL 200 MG PO TABS
200.0000 mg | ORAL_TABLET | Freq: Every day | ORAL | Status: DC
  Administered 2022-03-17 – 2022-03-19 (×3): 200 mg via ORAL
  Filled 2022-03-17 (×3): qty 1

## 2022-03-17 NOTE — Progress Notes (Signed)
Daily Progress Note Intern Pager: [phone]  Patient name: Anthony [name] Medical record number: [id number] Date of birth: January 20, 1956 Age: 66 y.o. Gender: male  Primary Care Provider: Jacelyn Grip, MD Consultants: None Code Status: Full  Pt Overview and Major Events to Date:  8/27: Admitted to progressive 8/29: Transferred to [facility]MT[facility] progressive  Assessment and Plan: Anthony [name] is a 66 year old male presented to ED after found crawling on the ground near [employer].  He was found to have elevated ethanol level and in A-fib RVR.  Differential for A-fib RVR includes medication noncompliance and alcohol use.  He was started on diltiazem drip and CIWA protocol.  * Atrial fibrillation with RVR ([place]) Likely secondary to medicine noncompliance and alcohol use.  Heart rate has been between 80-100 on dilt drip. Echo showed mild-moderate mitral regurgitation. - D/C dilt -12.5 mg metoprolol succinate daily - 200 mg amiodarone daily - PT/OT consulted, appreciate recss - Currently on Xarelto  Hypokalemia Potassium 3.6 today - Replete  Hypomagnesemia Mag 1.1 today - Replete  Pressure injury of skin Pressure injury on fifth digit left foot.  Healing, not infected. - Wound care per RN  Macrocytic anemia Hemoglobin of 9.5.  Likely secondary to alcohol use. - Folate and B12 within normal limits   Alcohol use disorder, severe, dependence (Anthony [name]) Last drink 8/26. - CIWA protocol - As needed Ativan no scheduled    FEN/GI: Regular PPx: Xarelto Dispo:Home pending PT/OT recs and clinical improvement.  Subjective:  I evaluated the patient at bedside.  He reports that he is concerned of exertional dyspnea-I discussed that we are still awaiting his echo.  He is not short of breath while at rest.  He says he is feeling mildly anxious but managing okay.  He is able to eat/drink and ambulate.  He is agreeable to PT OT.  Objective: Temp:  [98 F (36.7 C)-98.8 F (37.1 C)] 98.8 F (37.1  C) (08/29 1054) Pulse Rate:  [61-97] 74 (08/29 1054) Resp:  [16-29] 20 (08/29 1054) BP: (97-129)/(74-87) 97/77 (08/29 1054) SpO2:  [92 %-100 %] 94 % (08/29 1054) Weight:  [70 kg-70.8 kg] 70 kg (08/29 0606) Physical Exam: General: Not in acute distress Cardiovascular: Regular rate and rhythm on exam  Respiratory: Clear to auscultation bilaterally, normal work of breathing Abdomen: Bowel sounds present, nondistended, nontender Extremities: Healing pressure ulcer on left fifth digit of left foot.  Healing laceration dorsal right foot.  No edema Neuro: Alert and oriented x3.  Mild tremulousness.  No intention tremor.  Laboratory: Most recent CBC Lab Results  Component Value Date   WBC 4.5 03/17/2022   HGB 9.5 (L) 03/17/2022   HCT 27.7 (L) 03/17/2022   MCV 104.5 (H) 03/17/2022   PLT 230 03/17/2022   Most recent BMP    Latest Ref Rng & Units 03/17/2022    3:56 AM  BMP  Glucose 70 - 99 mg/dL 89   BUN 8 - 23 mg/dL 7   Creatinine 0.61 - 1.24 mg/dL 0.61   Sodium 135 - 145 mmol/L 136   Potassium 3.5 - 5.1 mmol/L 3.6   Chloride 98 - 111 mmol/L 110   CO2 22 - 32 mmol/L 18   Calcium 8.9 - 10.3 mg/dL 7.2    ECHOCARDIOGRAM COMPLETE  Result Date: 03/17/2022    ECHOCARDIOGRAM REPORT   Patient Name:   Anthony [name] Date of Exam: 03/17/2022 Medical Rec #:  [id number]    Height:       70.0 in  Accession #:    [id number]   Weight:       154.3 lb Date of Birth:  1956/06/17    BSA:          1.869 m Patient Age:    89 years     BP:           119/83 mmHg Patient Gender: M            HR:           84 bpm. Exam Location:  Inpatient Procedure: 2D Echo, Cardiac Doppler and Color Doppler Indications:    CHF-Acute Systolic U93.23  History:        Patient has prior history of Echocardiogram examinations, most                 recent 09/29/2020. Risk Factors:Current Smoker, Hypertension and                 ETOH.  Sonographer:    Anthony [name] RDCS Referring Phys: [id number] [place] [facility]  Sonographer Comments: Image  acquisition challenging due to respiratory motion. IMPRESSIONS  1. Left ventricular ejection fraction, by estimation, is 55 to 60%. The left ventricle has normal function. The left ventricle has no regional wall motion abnormalities. Left ventricular diastolic parameters are consistent with Grade III diastolic dysfunction (restrictive). Elevated left atrial pressure.  2. Right ventricular systolic function is normal. The right ventricular size is mildly enlarged. There is normal pulmonary artery systolic pressure. The estimated right ventricular systolic pressure is 25.4 mmHg.  3. Left atrial size was mildly dilated.  4. The mitral valve is abnormal. Mild to moderate mitral valve regurgitation. No evidence of mitral stenosis.  5. The aortic valve is tricuspid. There is mild calcification of the aortic valve. There is mild thickening of the aortic valve. Aortic valve regurgitation is not visualized. No aortic stenosis is present.  6. The inferior vena cava is normal in size with greater than 50% respiratory variability, suggesting right atrial pressure of 3 mmHg. Comparison(s): Mitral regurgitation increased from prior. FINDINGS  Left Ventricle: Left ventricular ejection fraction, by estimation, is 55 to 60%. The left ventricle has normal function. The left ventricle has no regional wall motion abnormalities. The left ventricular internal cavity size was normal in size. There is  no left ventricular hypertrophy. Left ventricular diastolic parameters are consistent with Grade III diastolic dysfunction (restrictive). Elevated left atrial pressure. Right Ventricle: The right ventricular size is mildly enlarged. No increase in right ventricular wall thickness. Right ventricular systolic function is normal. There is normal pulmonary artery systolic pressure. The tricuspid regurgitant velocity is 2.82  m/s, and with an assumed right atrial pressure of 3 mmHg, the estimated right ventricular systolic pressure is 27.0 mmHg.  Left Atrium: Left atrial size was mildly dilated. Right Atrium: Right atrial size was normal in size. Pericardium: There is no evidence of pericardial effusion. Mitral Valve: Mitral valve billowing. The mitral valve is abnormal. Mild to moderate mitral valve regurgitation. No evidence of mitral valve stenosis. Tricuspid Valve: The tricuspid valve is normal in structure. Tricuspid valve regurgitation is mild . No evidence of tricuspid stenosis. Aortic Valve: The aortic valve is tricuspid. There is mild calcification of the aortic valve. There is mild thickening of the aortic valve. Aortic valve regurgitation is not visualized. No aortic stenosis is present. Pulmonic Valve: The pulmonic valve was normal in structure. Pulmonic valve regurgitation is not visualized. No evidence of pulmonic stenosis. Aorta: The aortic root and ascending aorta  are structurally normal, with no evidence of dilitation. Venous: The inferior vena cava is normal in size with greater than 50% respiratory variability, suggesting right atrial pressure of 3 mmHg. IAS/Shunts: No atrial level shunt detected by color flow Doppler.  LEFT VENTRICLE PLAX 2D LVIDd:         4.20 cm      Diastology LVIDs:         3.30 cm      LV e' medial:    8.84 cm/s LV PW:         0.70 cm      LV E/e' medial:  9.4 LV IVS:        0.80 cm      LV e' lateral:   14.10 cm/s LVOT diam:     2.10 cm      LV E/e' lateral: 5.9 LV SV:         53 LV SV Index:   28 LVOT Area:     3.46 cm  LV Volumes (MOD) LV vol d, MOD A2C: 115.0 ml LV vol d, MOD A4C: 99.9 ml LV vol s, MOD A2C: 58.7 ml LV vol s, MOD A4C: 33.9 ml LV SV MOD A2C:     56.3 ml LV SV MOD A4C:     99.9 ml LV SV MOD BP:      60.0 ml RIGHT VENTRICLE RV S prime:     11.60 cm/s TAPSE (M-mode): 2.2 cm LEFT ATRIUM             Index        RIGHT ATRIUM           Index LA diam:        4.70 cm 2.51 cm/m   RA Area:     18.50 cm LA Vol (A2C):   64.8 ml 34.66 ml/m  RA Volume:   45.40 ml  24.29 ml/m LA Vol (A4C):   68.5 ml 36.64  ml/m LA Biplane Vol: 69.6 ml 37.23 ml/m  AORTIC VALVE LVOT Vmax:   92.80 cm/s LVOT Vmean:  61.200 cm/s LVOT VTI:    0.152 m  AORTA Ao Root diam: 3.70 cm Ao Asc diam:  3.20 cm MITRAL VALVE                  TRICUSPID VALVE MV Area (PHT): 4.89 cm       TR Peak grad:   31.8 mmHg MV Decel Time: 155 msec       TR Vmax:        282.00 cm/s MR Peak grad:    81.0 mmHg MR Mean grad:    55.0 mmHg    SHUNTS MR Vmax:         450.00 cm/s  Systemic VTI:  0.15 m MR Vmean:        351.0 cm/s   Systemic Diam: 2.10 cm MR PISA:         1.01 cm MR PISA Eff ROA: 9 mm MR PISA Radius:  0.40 cm MV E velocity: 82.90 cm/s MV A velocity: 37.40 cm/s MV E/A ratio:  2.22 Rudean Haskell MD Electronically signed by Rudean Haskell MD Signature Date/Time: 03/17/2022/9:30:41 AM    Final        Leslie Dales, DO 03/17/2022, 11:04 AM  PGY-1, [place] Intern pager: [phone], text pages welcome Secure chat group [facility] Teaching Service

## 2022-03-17 NOTE — Evaluation (Addendum)
Physical Therapy Evaluation Patient Details Name: Anthony [name] MRN: [id number] DOB: September 12, 1955 Today's Date: 03/17/2022  History of Present Illness  Patient is a 66 year old male presented to ED after found crawling on the ground near [employer].  He was found to have elevated ethanol level and in A-fib RVR.  Differential for A-fib RVR includes medication noncompliance and alcohol use.  He was started on diltiazem drip and CIWA protocol.    Clinical Impression  Anthony [name] is 66 y.o. male admitted with above HPI and diagnosis. Patient is currently limited by functional impairments below (see PT problem list). Patient currently struggling with homelessness and is independent at baseline. Currently her requires min guard for safety with all mobility. Patient will benefit from continued skilled PT interventions to address impairments and progress independence with mobility, recommending rehab at SNF vs substance rehab if pt qualifies and progresses mobility to safe level for independence. Acute PT will follow and progress as able.        Recommendations for follow up therapy are one component of a multi-disciplinary discharge planning process, led by the attending physician.  Recommendations may be updated based on patient status, additional functional criteria and insurance authorization.  Follow Up Recommendations Skilled nursing-short term rehab (<3 hours/day) (rehab/detox from ETOH (pt wants to go to subastance abuse rehab) struggling with homelessness, may be difficult placement) Can patient physically be transported by private vehicle: No    Assistance Recommended at Discharge Intermittent Supervision/Assistance  Patient can return home with the following  A [place] help with walking and/or transfers;A [place] help with bathing/dressing/bathroom;Assistance with cooking/housework;Direct supervision/assist for medications management;Help with stairs or ramp for entrance;Assist for  transportation    Equipment Recommendations Rollator (4 wheels)  Recommendations for Other Services       Functional Status Assessment Patient has had a recent decline in their functional status and demonstrates the ability to make significant improvements in function in a reasonable and predictable amount of time.     Precautions / Restrictions Precautions Precautions: Fall Restrictions Weight Bearing Restrictions: No      Mobility  Bed Mobility Overal bed mobility: Needs Assistance Bed Mobility: Supine to Sit     Supine to sit: Min guard, Supervision     General bed mobility comments: gaurd/supervision for safety    Transfers Overall transfer level: Needs assistance Equipment used: Rolling walker (2 wheels) Transfers: Sit to/from Stand Sit to Stand: Min guard           General transfer comment: close guard for safety, and cues for hand placement on RW.    Ambulation/Gait Ambulation/Gait assistance: Min assist, Min guard Gait Distance (Feet): 250 Feet Assistive device: Rolling walker (2 wheels) Gait Pattern/deviations: Step-through pattern, Decreased stride length, Drifts right/left, Narrow base of support Gait velocity: decr     General Gait Details: cues for proximity to RW and pt improved management as gait distance porgressed. no major LOb and progressed to min guard. pt steady through turns. VSS with HR in 120's.  Stairs            Wheelchair Mobility    Modified Rankin (Stroke Patients Only)       Balance Overall balance assessment: Needs assistance, History of Falls Sitting-balance support: Feet supported, No upper extremity supported Sitting balance-Leahy Scale: Good     Standing balance support: During functional activity, Reliant on assistive device for balance, Bilateral upper extremity supported Standing balance-Leahy Scale: Fair  Pertinent Vitals/Pain Pain Assessment Pain Assessment:  No/denies pain    Home Living Family/patient expects to be discharged to:: Unsure                   Additional Comments: was staying at [facility] (homeless shelter) stays at [facility] that they pay the rent for (not the nicest places per pt description but somewhere to stay). pt states he cannot go back after discharge.    Prior Function Prior Level of Function : Independent/Modified Independent             Mobility Comments: pt ambulates with no device, uses cart at food store, does not drive.       Hand Dominance   Dominant Hand: Right    Extremity/Trunk Assessment   Upper Extremity Assessment Upper Extremity Assessment: Overall WFL for tasks assessed    Lower Extremity Assessment Lower Extremity Assessment: RLE deficits/detail;LLE deficits/detail;Overall WFL for tasks assessed RLE Deficits / Details: 4/5 grossly RLE Sensation: WNL RLE Coordination: decreased fine motor LLE Deficits / Details: 4/5 grossly LLE Sensation: WNL LLE Coordination: decreased fine motor    Cervical / Trunk Assessment Cervical / Trunk Assessment: Kyphotic  Communication   Communication: No difficulties  Cognition Arousal/Alertness: Awake/alert Behavior During Therapy: WFL for tasks assessed/performed Overall Cognitive Status: No family/caregiver present to determine baseline cognitive functioning                                 General Comments: pt overall WFL's orientedx4, n        General Comments      Exercises     Assessment/Plan    PT Assessment Patient needs continued PT services  PT Problem List Decreased strength;Decreased activity tolerance;Decreased balance;Decreased mobility;Decreased cognition;Decreased coordination;Decreased safety awareness;Decreased knowledge of precautions       PT Treatment Interventions DME instruction;Gait training;Stair training;Functional mobility training;Therapeutic activities;Therapeutic exercise;Balance  training;Neuromuscular re-education;Patient/family education    PT Goals (Current goals can be found in the Care Plan section)  Acute Rehab PT Goals Patient Stated Goal: none specified by patient, mentions getting better and into rehab PT Goal Formulation: With patient Time For Goal Achievement: 03/31/22 Potential to Achieve Goals: Good    Frequency Min 3X/week     Co-evaluation               AM-PAC PT "6 Clicks" Mobility  Outcome Measure Help needed turning from your back to your side while in a flat bed without using bedrails?: A Difonzo Help needed moving from lying on your back to sitting on the side of a flat bed without using bedrails?: A Arca Help needed moving to and from a bed to a chair (including a wheelchair)?: A [place] Help needed standing up from a chair using your arms (e.g., wheelchair or bedside chair)?: A [place]ann Help needed to walk in hospital room?: A Bier Help needed climbing 3-5 steps with a railing? : A Lot 6 Click Score: 17    End of Session Equipment Utilized During Treatment: Gait belt Activity Tolerance: Patient tolerated treatment well Patient left: with call bell/phone within reach;with chair alarm set;in chair Nurse Communication: Mobility status PT Visit Diagnosis: Unsteadiness on feet (R26.81);Difficulty in walking, not elsewhere classified (R26.2);Other abnormalities of gait and mobility (R26.89)    Time: 0254-2706 PT Time Calculation (min) (ACUTE ONLY): 31 min   Charges:   PT Evaluation $PT Eval Moderate Complexity: 1 Mod PT Treatments $Gait Training: 8-22 mins  Verner Mould, DPT Acute Rehabilitation Services Office [phone] Pager [phone]  03/17/22 2:52 PM

## 2022-03-17 NOTE — Consult Note (Signed)
[facility] Nurse Consult Note: Reason for Consult: Consult requested for left foot.  Left anterior 5th toe full thickness wound with moist eschar, .5X.5cm.  No odor, drainage, or fluctuance. Dressing procedure/placement/frequency: Topical treatment orders provided for bedside nurses to perform to assist with removal of nonviable tissue as follows: Apply Medihoney to left 5th toe Q day, then cover with foam dressing.  (Change foam dressing Q 3 days or PRN soiling.) Please re-consult if further assistance is needed.  Thank-you,  Julien Girt MSN, [place], [place], [place], [place]

## 2022-03-17 NOTE — Social Work (Signed)
CSW acknowledges consult for Substance Use and Homeless resources. CSW met with pt at bedside. Pt states that his drinking depends on the day, he could have a beer or will buy a bottle of whiskey. States he has struggled with alcohol use for a long time. He notes he has been to rehab and AA in the past, when he had 3 DUI's. Pt states he has a paper with a detox center he can go to and that they will take him and then find him a place to stay. Pt unable to identify the name of a program or provider. Pt states his plan is to discharge from the hospital and into detox.  Pt states he had housing through the [facility], but he was arrested and they put all of his things into storage. He is unable to get to any of his medicare cards to use his medication assistance, says he has been paying for everything with cash. Pt says he has a court date on September 22 for the charges he was arrested for.  CSW to follow up on detox center and medication assistance. TOC will be available for additional needs.

## 2022-03-17 NOTE — Assessment & Plan Note (Signed)
Mag 1.3 today - Replete

## 2022-03-17 NOTE — Assessment & Plan Note (Addendum)
Discontinue CIWA.  No withdrawal symptoms today.

## 2022-03-17 NOTE — Hospital Course (Signed)
Anthony [name] is a 66 year old male who presented to the ED after found crawling on the ground near [employer].  He was found to have elevated ethanol level and A-fib RVR.  Differential for A-fib RVR includes medication noncompliance and alcohol use.  He was started on diltiazem drip and CIWA protocol.  Past medical history includes alcohol-use disorder with history of delirium tremens, hypertension and anemia.  Atrial fibrillation with RVR ([facility]) Heart rate stabilized with diltiazem drip.  He was converted to oral metoprolol after 2 days.  Remained NSR.  Stable at time of discharge ***.  Echo showed***  Hypokalemia Potassium of 3.2.  Potassium was repleted and stable at time of discharge.  Hypomagnesemia Chronically low magnesium, likely secondary to severe alcohol use disorder.  Magnesium was repleted and stable at time of discharge. ***  Macrocytic anemia Longstanding history of macrocytic anemia.  Folate and B12 normal.  Stable during admission.  Alcohol use disorder, severe, dependence ([place]) Patient was monitored with CIWA protocol.  He did/did not meet criteria of delirium tremens***.

## 2022-03-17 NOTE — Progress Notes (Signed)
  Echocardiogram 2D Echocardiogram has been performed.  Fidel Levy 03/17/2022, 9:12 AM

## 2022-03-17 NOTE — Progress Notes (Signed)
Heart Failure Nurse Navigator Progress Note  Assessed for HV TOC readiness. Pt admission related to ETOH and AF RVR. Upon review no dieretics noted during this hospitalization. Repeat ECHO with EF 55-60%, G3DD. No needs noted for HV TOC clinic follow up upon DC.   HF Navigation team will sign off.

## 2022-03-17 NOTE — Assessment & Plan Note (Signed)
Stable

## 2022-03-17 NOTE — Assessment & Plan Note (Addendum)
Likely secondary to medicine noncompliance and alcohol use.  Heart rate has been between 80-100 on dilt drip. Echo showed mild-moderate mitral regurgitation. - D/C dilt -12.5 mg metoprolol succinate daily - 200 mg amiodarone daily - PT/OT consulted, appreciate recss - Currently on Xarelto

## 2022-03-17 NOTE — Progress Notes (Signed)
Mobility Specialist - Progress Note   03/17/22 1548  Mobility  Activity Ambulated with assistance in hallway  Level of Assistance Contact guard assist, steadying assist  Assistive Device Front wheel walker  Distance Ambulated (ft) 300 ft  Activity Response Tolerated well  $Mobility charge 1 Mobility     Pre-mobility:80 HR, 116/80 BP Post-mobility:84 HR,108/81 BP  Pt was received in bed and agreeable to mobility. No c/o pain throughout ambulation. Pt was returned back to bed with all needs met and bed alarm on.       Mobility Specialist  

## 2022-03-17 NOTE — Assessment & Plan Note (Signed)
Pressure injury on fifth digit left foot.  Healing, not infected. - Wound care per RN

## 2022-03-17 NOTE — Assessment & Plan Note (Addendum)
Resolved

## 2022-03-18 DIAGNOSIS — F1020 Alcohol dependence, uncomplicated: Secondary | ICD-10-CM | POA: Diagnosis not present

## 2022-03-18 DIAGNOSIS — I4891 Unspecified atrial fibrillation: Secondary | ICD-10-CM | POA: Diagnosis not present

## 2022-03-18 LAB — BASIC METABOLIC PANEL
Anion gap: 5 (ref 5–15)
BUN: 8 mg/dL (ref 8–23)
CO2: 21 mmol/L — ABNORMAL LOW (ref 22–32)
Calcium: 7.1 mg/dL — ABNORMAL LOW (ref 8.9–10.3)
Chloride: 111 mmol/L (ref 98–111)
Creatinine, Ser: 0.68 mg/dL (ref 0.61–1.24)
GFR, Estimated: >60 mL/min (ref >60)
Glucose, Bld: 89 mg/dL (ref 70–99)
Potassium: 4.3 mmol/L (ref 3.5–5.1)
Sodium: 137 mmol/L (ref 135–145)

## 2022-03-18 LAB — MAGNESIUM: Magnesium: 1.3 mg/dL — ABNORMAL LOW (ref 1.7–2.4)

## 2022-03-18 MED ORDER — MAGNESIUM SULFATE 4 GM/100ML IV SOLN
4.0000 g | Freq: Once | INTRAVENOUS | Status: AC
  Administered 2022-03-18: 4 g via INTRAVENOUS
  Filled 2022-03-18: qty 100

## 2022-03-18 MED ORDER — METHOCARBAMOL 500 MG PO TABS
750.0000 mg | ORAL_TABLET | Freq: Once | ORAL | Status: AC | PRN
Start: 2022-03-18 — End: 2022-03-18
  Administered 2022-03-18: 750 mg via ORAL
  Filled 2022-03-18: qty 2

## 2022-03-18 NOTE — Progress Notes (Signed)
Patient requesting gabapentin, tylenol, and a muscle relaxer for back pain. Saw methocarbamol on home meds. Also wants cream for his arm. Previous RN gave unit stocked cream and I notified MD about patient needs for pain.

## 2022-03-18 NOTE — Progress Notes (Signed)
   03/18/22 1407  Mobility  Activity Ambulated with assistance in hallway  Level of Assistance Contact guard assist, steadying assist  Assistive Device Front wheel walker  Distance Ambulated (ft) 500 ft  Activity Response Tolerated well  $Mobility charge 1 Mobility   Mobility Specialist Progress Note  Received pt in bed having no complaints and agreeable to mobility. Pt was asymptomatic throughout ambulation and returned to room w/o fault. Left EOB w/ call bell in reach and all needs met.  Anthony [name] Mobility Specialist

## 2022-03-18 NOTE — Progress Notes (Signed)
     Daily Progress Note Intern Pager: [phone]  Patient name: Anthony [name] Medical record number: [id number] Date of birth: 08/25/1955 Age: 66 y.o. Gender: male  Primary Care Provider: Jacelyn Grip, MD Consultants: None Code Status: Full  Pt Overview and Major Events to Date:  8/27: Admitted to progressive 8/29: Transferred to FMTS progressive  Assessment and Plan: Anthony [name] is a 65 year old male presented to ED after found crawling on the ground near [employer].  He was found to have elevated ethanol level and in A-fib RVR.  Differential for A-fib RVR includes medication noncompliance and alcohol use.  He was started on diltiazem drip and CIWA protocol.  * Atrial fibrillation with RVR (HCC) Heart rate remains between 80-100 on oral metoprolol and amiodarone.  Increase of heart rate to 110 at 0800 secondary to needing his morning medications and his alcohol withdrawal.  PT saw patient and recommended SNF and rollator- we will start this process.  -12.5 mg metoprolol succinate daily - 200 mg amiodarone daily - Continue to work with PT/OT, recs appreciated - Currently on Xarelto -Pending SNF  Hypokalemia 4.3 today. - Continue to monitor  Hypomagnesemia Mag 1.3 today - Replete  Pressure injury of skin Healing, noninfected. - Wound care per RN  Macrocytic anemia Has been stable. Recheck tomorrow. - Consider folate and B12 supplementation pending tomorrow recheck.   Alcohol use disorder, severe, dependence ([place]) CIWA 6 at midnight.  Has received 1 mg as needed Ativan. - CIWA protocol - As needed Ativan no scheduled    FEN/GI: Regular PPx: Xarelto Dispo:SNF  pending placement .   Subjective:  He has no acute concerns today.  He would like to try for SNF placement and would like rollator per PT recommendations.  He would like to speak to social work.  Objective: Temp:  [97.8 F (36.6 C)-99.1 F (37.3 C)] 97.8 F (36.6 C) (08/30 0548) Pulse Rate:  [74-91]  91 (08/30 0738) Resp:  [16-25] 18 (08/30 0738) BP: (97-114)/(75-92) 114/92 (08/30 0548) SpO2:  [94 %-100 %] 95 % (08/30 0738) Weight:  [70.3 kg-70.4 kg] 70.3 kg (08/30 0548) Physical Exam: General: Not in acute distress Cardiovascular: Regular rate and rhythm on exam, no MRG Respiratory: Clear to auscultation bilaterally, normal work of breathing.  Room air  Laboratory: Most recent CBC Lab Results  Component Value Date   WBC 4.5 03/17/2022   HGB 9.5 (L) 03/17/2022   HCT 27.7 (L) 03/17/2022   MCV 104.5 (H) 03/17/2022   PLT 230 03/17/2022   Most recent BMP    Latest Ref Rng & Units 03/18/2022    6:52 AM  BMP  Glucose 70 - 99 mg/dL 89   BUN 8 - 23 mg/dL 8   Creatinine 0.61 - 1.24 mg/dL 0.68   Sodium 135 - 145 mmol/L 137   Potassium 3.5 - 5.1 mmol/L 4.3   Chloride 98 - 111 mmol/L 111   CO2 22 - 32 mmol/L 21   Calcium 8.9 - 10.3 mg/dL 7.1     Leslie Dales, DO 03/18/2022, 9:03 AM  PGY-1, [place] Intern pager: [phone], text pages welcome Secure chat group [place]

## 2022-03-18 NOTE — Progress Notes (Signed)
Physical Therapy Treatment Patient Details Name: Anthony [name] MRN: [id number] DOB: 07/17/1956 Today's Date: 03/18/2022   History of Present Illness Patient is a 66 year old male presented to ED after found crawling on the ground near [employer].  He was found to have elevated ethanol level and in A-fib RVR.  Differential for A-fib RVR includes medication noncompliance and alcohol use.  He was started on diltiazem drip and CIWA protocol.    PT Comments    Patient is agreeable to PT and is making progress with functional independence. Patient ambulated in hallway with verbal cues safety and positioning of rolling walker. No physical assistance required with mobility efforts, however patient does need cues for safety with decreased safety awareness at times. Recommend to continue PT to maximize independence and facilitate return to prior level of function. The patient is requesting to go to a detox facility and then have assistance with his living situation afterwards.    Recommendations for follow up therapy are one component of a multi-disciplinary discharge planning process, led by the attending physician.  Recommendations may be updated based on patient status, additional functional criteria and insurance authorization.  Follow Up Recommendations  Other (comment) (rehab/detox from ETOH (pt wants to go to subastance abuse rehab) Can patient physically be transported by private vehicle: Yes   Assistance Recommended at Discharge Intermittent Supervision/Assistance  Patient can return home with the following A [place] help with walking and/or transfers;A [place] help with bathing/dressing/bathroom;Assistance with cooking/housework;Direct supervision/assist for medications management;Help with stairs or ramp for entrance;Assist for transportation   Equipment Recommendations  Rollator (4 wheels)    Recommendations for Other Services       Precautions / Restrictions Precautions Precautions:  Fall Restrictions Weight Bearing Restrictions: No     Mobility  Bed Mobility Overal bed mobility: Modified Independent                  Transfers Overall transfer level: Needs assistance Equipment used: Rolling walker (2 wheels) Transfers: Sit to/from Stand Sit to Stand: Supervision           General transfer comment: cues for hand placement for safety    Ambulation/Gait Ambulation/Gait assistance: Min guard, Supervision Gait Distance (Feet): 250 Feet Assistive device: Rolling walker (2 wheels) Gait Pattern/deviations: Step-through pattern, Decreased stride length, Trunk flexed Gait velocity: decreased     General Gait Details: occasional cues for positioning of rolling walker for safety. vitals stable during session with heart rate in the 110's. no shortness of breath is noted with activity. discussed using a rollator to have the chair access for limited endurance with loger distance ambualtion.   Stairs             Wheelchair Mobility    Modified Rankin (Stroke Patients Only)       Balance Overall balance assessment: Needs assistance, History of Falls Sitting-balance support: No upper extremity supported, Feet supported Sitting balance-Leahy Scale: Good     Standing balance support: No upper extremity supported, During functional activity, Bilateral upper extremity supported Standing balance-Leahy Scale: Fair                              Cognition Arousal/Alertness: Awake/alert Behavior During Therapy: WFL for tasks assessed/performed Overall Cognitive Status: No family/caregiver present to determine baseline cognitive functioning  General Comments: very talkative, needs cues for attention to task. decreased awareness of need for assistance        Exercises      General Comments General comments (skin integrity, edema, etc.): HR up to 124 with activity in room      Pertinent  Vitals/Pain Pain Assessment Pain Assessment: No/denies pain    Home Living Family/patient expects to be discharged to:: Unsure                   Additional Comments: was staying at [facility] (homeless shelter) stays at [place] that they pay the rent for (not the nicest places per pt description but somewhere to stay). pt states he cannot go back after discharge.    Prior Function            PT Goals (current goals can now be found in the care plan section) Acute Rehab PT Goals Patient Stated Goal: to get to a detox facility PT Goal Formulation: With patient Time For Goal Achievement: 03/31/22 Potential to Achieve Goals: Good Progress towards PT goals: Progressing toward goals    Frequency    Min 3X/week      PT Plan Current plan remains appropriate    Co-evaluation              AM-PAC PT "6 Clicks" Mobility   Outcome Measure  Help needed turning from your back to your side while in a flat bed without using bedrails?: A Nanna Help needed moving from lying on your back to sitting on the side of a flat bed without using bedrails?: A Mcvicar Help needed moving to and from a bed to a chair (including a wheelchair)?: A Petree Help needed standing up from a chair using your arms (e.g., wheelchair or bedside chair)?: A Adkison Help needed to walk in hospital room?: A S[place]augh Help needed climbing 3-5 steps with a railing? : A Lot 6 Click Score: 17    End of Session Equipment Utilized During Treatment: Gait belt Activity Tolerance: Patient tolerated treatment well Patient left: in bed;with call bell/phone within reach;with bed alarm set   PT Visit Diagnosis: Unsteadiness on feet (R26.81);Difficulty in walking, not elsewhere classified (R26.2);Other abnormalities of gait and mobility (R26.89)     Time: 9563-8756 PT Time Calculation (min) (ACUTE ONLY): 37 min  Charges:  $Gait Training: 8-22 mins $Therapeutic Activity: 8-22 mins                     Minna Merritts,  PT, MPT    Percell Locus 03/18/2022, 12:19 PM

## 2022-03-18 NOTE — Evaluation (Signed)
Occupational Therapy Evaluation Patient Details Name: Anthony [name] MRN: [id number] DOB: Jul 09, 1956 Today's Date: 03/18/2022   History of Present Illness Patient is a 66 year old male presented to ED after found crawling on the ground near [employer].  He was found to have elevated ethanol level and in A-fib RVR.  Differential for A-fib RVR includes medication noncompliance and alcohol use.  He was started on diltiazem drip and CIWA protocol.   Clinical Impression   PTA patient independent with ADLs and mobility. Admitted for above and presents with problem list below, including impaired balance, decreased activity tolerance and generalized weakness. Anticipate cognition is at baseline, but noted decreased attention and problem solving, question recall as pt is very repetitive during session. Pt currently requires supervision for transfers and ADLs in room, mobility using RW.  Cueing for safety and activity tolerance, HR up to 124 with minimal activity. Will benefit from continued OT service acutely and after dc at SNF level to optimize independence, safety and return to PLOF.       Recommendations for follow up therapy are one component of a multi-disciplinary discharge planning process, led by the attending physician.  Recommendations may be updated based on patient status, additional functional criteria and insurance authorization.   Follow Up Recommendations  Skilled nursing-short term rehab (<3 hours/day) (vs substance abuse rehab)    Assistance Recommended at Discharge Intermittent Supervision/Assistance  Patient can return home with the following A [place] help with walking and/or transfers;A General help with bathing/dressing/bathroom;Direct supervision/assist for medications management;Direct supervision/assist for financial management;Assist for transportation    Functional Status Assessment  Patient has had a recent decline in their functional status and demonstrates the ability to  make significant improvements in function in a reasonable and predictable amount of time.  Equipment Recommendations  None recommended by OT    Recommendations for Other Services       Precautions / Restrictions Precautions Precautions: Fall Restrictions Weight Bearing Restrictions: No      Mobility Bed Mobility Overal bed mobility: Modified Independent             General bed mobility comments: no assist required    Transfers Overall transfer level: Needs assistance Equipment used: Rolling walker (2 wheels) Transfers: Sit to/from Stand Sit to Stand: Supervision           General transfer comment: for safety      Balance Overall balance assessment: Needs assistance, History of Falls Sitting-balance support: No upper extremity supported, Feet supported Sitting balance-Leahy Scale: Good     Standing balance support: No upper extremity supported, During functional activity, Bilateral upper extremity supported Standing balance-Leahy Scale: Fair Standing balance comment: relies on BUE support dynamically, able to complete ADLs without UE support                           ADL either performed or assessed with clinical judgement   ADL Overall ADL's : Needs assistance/impaired     Grooming: Supervision/safety;Standing           Upper Body Dressing : Set up;Sitting   Lower Body Dressing: Supervision/safety;Sit to/from stand   Toilet Transfer: Supervision/safety;Ambulation;Rolling walker (2 wheels) Toilet Transfer Details (indicate cue type and reason): simulated in room         Functional mobility during ADLs: Supervision/safety;Rolling walker (2 wheels) General ADL Comments: pt HR up to 124 with activity     Vision Baseline Vision/History: 1 Wears glasses Vision Assessment?: No apparent visual  deficits     Perception     Praxis      Pertinent Vitals/Pain Pain Assessment Pain Assessment: No/denies pain     Hand Dominance  Right   Extremity/Trunk Assessment Upper Extremity Assessment Upper Extremity Assessment: Overall WFL for tasks assessed   Lower Extremity Assessment Lower Extremity Assessment: Defer to PT evaluation   Cervical / Trunk Assessment Cervical / Trunk Assessment: Kyphotic   Communication Communication Communication: No difficulties   Cognition Arousal/Alertness: Awake/alert Behavior During Therapy: WFL for tasks assessed/performed Overall Cognitive Status: No family/caregiver present to determine baseline cognitive functioning                                 General Comments: pt repetitive and requires cueing to attend to task, demonstrates some decreased awareness and problem solving (asking for a belt to keep him from falling out the the chair when he wakes). Anticpate near baseline.     General Comments  HR up to 124 with activity in room    Exercises     Shoulder Instructions      Home Living Family/patient expects to be discharged to:: Unsure                                 Additional Comments: was staying at [facility] (homeless shelter) stays at [place] that they pay the rent for (not the nicest places per pt description but somewhere to stay). pt states he cannot go back after discharge.      Prior Functioning/Environment Prior Level of Function : Independent/Modified Independent             Mobility Comments: pt ambulates with no device, uses cart at food store, does not drive. ADLs Comments: independent, carries backpack with belongings        OT Problem List: Impaired balance (sitting and/or standing);Decreased activity tolerance;Decreased safety awareness;Decreased knowledge of use of DME or AE;Decreased knowledge of precautions;Cardiopulmonary status limiting activity      OT Treatment/Interventions: Self-care/ADL training;DME and/or AE instruction;Therapeutic exercise;Therapeutic activities;Patient/family education;Balance  training;Energy conservation    OT Goals(Current goals can be found in the care plan section) Acute Rehab OT Goals Patient Stated Goal: get better OT Goal Formulation: With patient Time For Goal Achievement: 04/01/22 Potential to Achieve Goals: Good  OT Frequency: Min 2X/week    Co-evaluation              AM-PAC OT "6 Clicks" Daily Activity     Outcome Measure Help from another person eating meals?: None Help from another person taking care of personal grooming?: A Nevares Help from another person toileting, which includes using toliet, bedpan, or urinal?: A Burgher Help from another person bathing (including washing, rinsing, drying)?: A [place] Help from another person to put on and taking off regular upper body clothing?: A [place] Help from another person to put on and taking off regular lower body clothing?: A Reuter 6 Click Score: 19   End of Session Equipment Utilized During Treatment: Rolling walker (2 wheels) Nurse Communication: Mobility status;Other (comment) (asking for belt to use in chair)  Activity Tolerance: Patient tolerated treatment well Patient left: with call bell/phone within reach;in bed;with bed alarm set  OT Visit Diagnosis: Other abnormalities of gait and mobility (R26.89);Muscle weakness (generalized) (M62.81)                Time: 5027-7412 OT  Time Calculation (min): 25 min Charges:  OT General Charges $OT Visit: 1 Visit OT Evaluation $OT Eval Low Complexity: 1 Low OT Treatments $Self Care/Home Management : 8-22 mins  Jolaine Artist, OT Acute Rehabilitation Services Office [phone]   Anthony [name] 03/18/2022, 9:35 AM

## 2022-03-18 NOTE — Social Work (Addendum)
CSW followed up with pt at bedside. Pt states he has no memory of CSW meeting with him yesterday. CSW explained that there is no documentation anywhere in the chart that says he was referred or accepted to a detox facility. Alternate options given, including several facilities highlighted that will take his insurance. Pt now states the detox center he is talking about was a flyer he got in the mail, and that he wasn't actually accepted. CSW explained that most of the time, those flyers are for out of pocket places that are upscale. Pt states he has Medicare and Medicaid that should pay, CSW educated that many of these places do not accept insurance.  The list CSW provided are facilities in the area that are accessible to everyone. Pt states that he has been to a few places and spoke at length about how they were trying to shove religion down his throat. CSW encouraged pt to contact some facilities and determine if they would be a good fit. CSW advised that pt has to make these arrangements.  CSW followed up with CM regarding pt's insurance and medication concerns. CM stated that if pt had used a pharmacy in the last few months, there may be a way to get that information. Pt states [facility] used the [facility] [address], but they make him present his OTC card to use that benefit. Pt notes that he uses it on supplements and minerals for which he has a big box of.   CSW let pt know that CM would work on getting him the walker / rollator he requested. Pt states he will get it himself from [facility] or [place]. CSW encouraged pt to look into treatment facilities and CSW will assist as possible. CM updated on new findings. TOC will continue to follow for DC needs. CSW noting PT/ OT are recommending SNF. Pt is not appropriate for SNF at this time and was able to be mostly independent in the room. SNF is not an alternative for substance use rehab. Pt would also be a poor candidate to place due to ongoing substance use and  homelessness, as well as pending criminal charges.

## 2022-03-19 ENCOUNTER — Other Ambulatory Visit (HOSPITAL_COMMUNITY): Payer: Self-pay

## 2022-03-19 DIAGNOSIS — R531 Weakness: Secondary | ICD-10-CM

## 2022-03-19 DIAGNOSIS — E876 Hypokalemia: Secondary | ICD-10-CM | POA: Diagnosis not present

## 2022-03-19 DIAGNOSIS — I4891 Unspecified atrial fibrillation: Secondary | ICD-10-CM | POA: Diagnosis not present

## 2022-03-19 DIAGNOSIS — Z659 Problem related to unspecified psychosocial circumstances: Secondary | ICD-10-CM

## 2022-03-19 DIAGNOSIS — F1020 Alcohol dependence, uncomplicated: Secondary | ICD-10-CM | POA: Diagnosis not present

## 2022-03-19 DIAGNOSIS — D539 Nutritional anemia, unspecified: Secondary | ICD-10-CM

## 2022-03-19 LAB — BASIC METABOLIC PANEL
Anion gap: 5 (ref 5–15)
BUN: 8 mg/dL (ref 8–23)
CO2: 21 mmol/L — ABNORMAL LOW (ref 22–32)
Calcium: 7.4 mg/dL — ABNORMAL LOW (ref 8.9–10.3)
Chloride: 108 mmol/L (ref 98–111)
Creatinine, Ser: 0.68 mg/dL (ref 0.61–1.24)
GFR, Estimated: >60 mL/min (ref >60)
Glucose, Bld: 89 mg/dL (ref 70–99)
Potassium: 4.2 mmol/L (ref 3.5–5.1)
Sodium: 134 mmol/L — ABNORMAL LOW (ref 135–145)

## 2022-03-19 LAB — CBC
HCT: 28.1 % — ABNORMAL LOW (ref 39.0–52.0)
Hemoglobin: 9.3 g/dL — ABNORMAL LOW (ref 13.0–17.0)
MCH: 35.8 pg — ABNORMAL HIGH (ref 26.0–34.0)
MCHC: 33.1 g/dL (ref 30.0–36.0)
MCV: 108.1 fL — ABNORMAL HIGH (ref 80.0–100.0)
Platelets: 219 10*3/uL (ref 150–400)
RBC: 2.6 MIL/uL — ABNORMAL LOW (ref 4.22–5.81)
RDW: 15.8 % — ABNORMAL HIGH (ref 11.5–15.5)
WBC: 4.2 10*3/uL (ref 4.0–10.5)
nRBC: 0 % (ref 0.0–0.2)

## 2022-03-19 LAB — MAGNESIUM: Magnesium: 1.4 mg/dL — ABNORMAL LOW (ref 1.7–2.4)

## 2022-03-19 MED ORDER — FUROSEMIDE 20 MG PO TABS
ORAL_TABLET | Freq: Every day | ORAL | 1 refills | Status: DC
  Filled 2022-03-19: qty 30, 30d supply, fill #0

## 2022-03-19 MED ORDER — MAGNESIUM SULFATE 4 GM/100ML IV SOLN
4.0000 g | Freq: Once | INTRAVENOUS | Status: AC
Start: 2022-03-19 — End: 2022-03-19
  Administered 2022-03-19: 4 g via INTRAVENOUS
  Filled 2022-03-19: qty 100

## 2022-03-19 MED ORDER — FOLIC ACID 1 MG PO TABS
1.0000 mg | ORAL_TABLET | Freq: Every day | ORAL | 1 refills | Status: DC
  Filled 2022-03-19: qty 30, 30d supply, fill #0

## 2022-03-19 MED ORDER — FERROUS SULFATE 325 (65 FE) MG PO TABS: 325.0000 mg | ORAL_TABLET | Freq: Every day | ORAL | 1 refills | Status: DC | PRN

## 2022-03-19 MED ORDER — RIVAROXABAN 20 MG PO TABS
ORAL_TABLET | Freq: Every day | ORAL | 1 refills | Status: DC
  Filled 2022-03-19: qty 30, 30d supply, fill #0

## 2022-03-19 MED ORDER — THIAMINE HCL 100 MG PO TABS
100.0000 mg | ORAL_TABLET | Freq: Every day | ORAL | 1 refills | Status: DC
  Filled 2022-03-19: qty 30, 30d supply, fill #0

## 2022-03-19 MED ORDER — METOPROLOL SUCCINATE ER 25 MG PO TB24
12.5000 mg | ORAL_TABLET | Freq: Every day | ORAL | 1 refills | Status: DC
  Filled 2022-03-19: qty 30, 60d supply, fill #0

## 2022-03-19 MED ORDER — MOMETASONE FURO-FORMOTEROL FUM 100-5 MCG/ACT IN AERO
2.0000 | INHALATION_SPRAY | Freq: Two times a day (BID) | RESPIRATORY_TRACT | 1 refills | Status: DC
  Filled 2022-03-19: qty 13, 30d supply, fill #0

## 2022-03-19 MED ORDER — AMIODARONE HCL 200 MG PO TABS
ORAL_TABLET | Freq: Every day | ORAL | 1 refills | Status: DC
  Filled 2022-03-19: qty 30, 30d supply, fill #0

## 2022-03-19 MED ORDER — ROSUVASTATIN CALCIUM 10 MG PO TABS
ORAL_TABLET | Freq: Every day | ORAL | 1 refills | Status: DC
  Filled 2022-03-19: qty 30, 30d supply, fill #0

## 2022-03-19 NOTE — Care Management Important Message (Signed)
Important Message  Patient Details  Name: Anthony [name] MRN: [id number] Date of Birth: February 20, 1956   Medicare Important Message Given:  Yes     Shelda Altes 03/19/2022, 8:58 AM

## 2022-03-19 NOTE — TOC Progression Note (Signed)
Transition of Care [facility]) - Progression Note    Patient Details  Name: Anthony [name] MRN: [id number] Date of Birth: Mar 31, 1956  Transition of [facility]) CM/SW Contact  Zenon Mayo, RN Phone Number: 03/19/2022, 11:57 AM  Clinical Narrative:    NCM spoke with patient , he states he has a bus pass and he is needing to get his [facility] OTC card that helps him to pay for healthy foods etc.  NCM contacted [facility] , was transferred to Member services at 1 [phone] to see if they can assist me in getting card for the patient.  Patient states he will buy a rollator on his card that he has,  NCM lspoke with Kiera at the [facility] at [phone].  She spoke with patient over the phone and instructed him on what to do to order on his card the things he needs.  He will need to call [phone] to make his order.  She states they will also mail him out another card and he will get it within 7 to 10 business days, he confirmed the address they need to mail the card to . He has no other needs. He will be going to [facility] at discharge.  He states he has a bus pass that he gets a discount on.  He was very appreciative of this NCM assisting him with his OTC card.           Expected Discharge Plan and Services           Expected Discharge Date: 03/19/22                                     Social Determinants of Health (SDOH) Interventions    Readmission Risk Interventions    10/08/2020    2:48 PM  Readmission Risk Prevention Plan  Transportation Screening Complete  PCP or Specialist Appt within 3-5 Days Complete  HRI or [place] Complete  Social Work Consult for [place] Planning/Counseling Complete  Palliative Care Screening Complete  Medication Review Press photographer) Complete

## 2022-03-19 NOTE — Progress Notes (Signed)
     Daily Progress Note Intern Pager: [phone]  Patient name: Anthony [name] Medical record number: [id number] Date of birth: June 23, 1956 Age: 66 y.o. Gender: male  Primary Care Provider: Jacelyn Grip, MD Consultants: None Code Status: Full  Pt Overview and Major Events to Date:  8/27: Admitted to progressive 8/29: Transferred to FMTS progressive  Assessment and Plan:  Anthony [name] is a 66 year old male who presented to ED after found crawling on the ground [name].  He was found to have elevated ethanol and in A-fib RVR.  Differential for A-fib RVR is medication noncompliance alcohol use.  * Atrial fibrillation with RVR (HCC) Heart rate at goal, he is sinus. -Continue metoprolol, amiodarone, Xarelto   Hypokalemia Resolved  Hypomagnesemia Mag 1.3 today - Replete  Pressure injury of skin Healing, noninfected. - Wound care per RN  Macrocytic anemia Stable   Alcohol use disorder, severe, dependence ([name]) Discontinue CIWA.  No withdrawal symptoms today.    FEN/GI: Regular PPx: Xarelto Dispo:Home today.   Subjective:  I evaluated patient at bedside.  He says that TOC has been unhelpful and he does not want rollator-he says he will get one himself. He is not interested in having [place] contact his brother at this time.  He says that he needs another day to sort things out, however we discussed that there is no medical reason to hold him - he is ultimately resigned to our medical decision making.  Objective: Temp:  [97.9 F (36.6 C)-99.2 F (37.3 C)] 97.9 F (36.6 C) (08/31 0428) Pulse Rate:  [88-98] 88 (08/31 0428) Resp:  [18-20] 20 (08/31 0428) BP: (105-139)/(80-96) 139/96 (08/31 0428) SpO2:  [94 %-100 %] 99 % (08/31 0428) Weight:  [71.6 kg] 71.6 kg (08/31 0122) Physical Exam: General: Not in acute distress Cardiovascular: Regular rate and rhythm on exam.  No murmurs rubs or gallops Respiratory: Clear to auscultation bilaterally, normal work of breathing on room  air  Laboratory: Most recent CBC Lab Results  Component Value Date   WBC 4.2 03/19/2022   HGB 9.3 (L) 03/19/2022   HCT 28.1 (L) 03/19/2022   MCV 108.1 (H) 03/19/2022   PLT 219 03/19/2022   Most recent BMP    Latest Ref Rng & Units 03/19/2022    3:24 AM  BMP  Glucose 70 - 99 mg/dL 89   BUN 8 - 23 mg/dL 8   Creatinine 0.61 - 1.24 mg/dL 0.68   Sodium 135 - 145 mmol/L 134   Potassium 3.5 - 5.1 mmol/L 4.2   Chloride 98 - 111 mmol/L 108   CO2 22 - 32 mmol/L 21   Calcium 8.9 - 10.3 mg/dL 7.4     Leslie Dales, DO 03/19/2022, 7:16 AM  PGY-1, [place] Intern pager: [phone], text pages welcome Secure chat group [place]

## 2022-03-19 NOTE — TOC Progression Note (Addendum)
Transition of Care [facility]) - Progression Note    Patient Details  Name: Anthony [name] MRN: [id number] Date of Birth: October 03, 1955  Transition of Care [facility]) CM/SW Contact  Zenon Mayo, RN Phone Number: 03/19/2022, 11:16 AM  Clinical Narrative:    NCM spoke with patient , he states he has a bus pass and he is needing to get his [facility] OTC card that helps him to pay for healthy foods etc.  NCM contacted [facility] , was transferred to Member services at 1 [phone] to see if they can assist me in getting card for the patient.  Patient states he will buy a rollator on his card that he has,  NCM lspoke with Kiera at the [place] at [phone].  She spoke with patient over the phone and instructed him on what to do to order on his card the things he needs.  He will need to call [phone] to make his order.  She states they will also mail him out another card and he will get it within 7 to 10 business days, he confirmed the address they need to mail the card to . He has no other needs. He will be going to [facility] at discharge.  He states he has a bus pass that he gets a discount on.  He was very appreciative of this NCM assisting him with his OTC card.          Expected Discharge Plan and Services           Expected Discharge Date: 03/19/22                                     Social Determinants of Health (SDOH) Interventions    Readmission Risk Interventions    10/08/2020    2:48 PM  Readmission Risk Prevention Plan  Transportation Screening Complete  PCP or Specialist Appt within 3-5 Days Complete  HRI or [place] Complete  Social Work Consult for [place] Planning/Counseling Complete  Palliative Care Screening Complete  Medication Review Press photographer) Complete

## 2022-03-19 NOTE — Discharge Instructions (Addendum)
Dear Anthony [name],  Thank you for letting us participate in your care. You were hospitalized for uncontrolled heart rate and diagnosed with Atrial fibrillation with RVR ([place]). You were treated with diltiazem drip, metoprolol, amiodarone.   POST-HOSPITAL & CARE INSTRUCTIONS Please take your metoprolol, amiodarone and Xarelto We encourage you to decrease alcohol intake and speak with your primary care provider about stopping Go to your follow up appointments (listed below)   DOCTOR'S APPOINTMENT   Future Appointments  Date Time Provider [place]  03/27/2022  9:45 AM Eppie Gibson, MD [facility]    Follow-up Information     Jacelyn Grip, MD. Go on 03/27/2022.   Specialty: Family Medicine Why: Follow-up appointment with Dr. Biagio Borg partner Dr. Joelyn Oms on Friday, September 8 at 9:45 AM Contact information: [place] [address] [phone]         Burnell Blanks, MD .   Specialty: Cardiology Contact information: [place]. [address] [place] [address] [phone]                 Take care and be well!  [facility]  [place], [place] [address] [phone]

## 2022-03-19 NOTE — Progress Notes (Signed)
Mobility Specialist Progress Note:   03/19/22 1000  Mobility  Activity Ambulated with assistance in hallway  Level of Assistance Standby assist, set-up cues, supervision of patient - no hands on  Assistive Device Front wheel walker  Distance Ambulated (ft) 120 ft  Activity Response Tolerated well  $Mobility charge 1 Mobility   Pt received at front desk asking for help using the phone. No complaints of pain. Left in chair with call bell in reach and all needs met.   [facility] Mackensi Mahadeo Mobility Specialist

## 2022-03-19 NOTE — Discharge Summary (Addendum)
[facility] Discharge Summary  Patient name: Anthony [name] Medical record number: [id number] Date of birth: 10/07/1955 Age: 66 y.o. Gender: male Date of Admission: 03/15/2022  Date of Discharge: 03/19/2022 Admitting Physician: Orene Desanctis, DO  Primary Care Provider: Jacelyn Grip, MD Consultants: None  Indication for Hospitalization: A-fib RVR  Brief Hospital Course:  Anthony [name] is a 66 year old male who presented to the ED after found crawling on the ground on all fours.  He was found to have elevated ethanol level and was in A-fib with RVR.  He was started on diltiazem drip and CIWA protocol.  Past medical history includes alcohol-use disorder with history of delirium tremens, A-fib, hypertension, and anemia.  Atrial fibrillation with RVR (HCC) Heart rate stabilized with diltiazem drip.  He was converted to oral metoprolol and amiodarone after 2 days.  Remained NSR for remainder of the hospitalization.  Echo was unremarkable except for mild to moderate mitral regurgitation. Stable at time of discharge.    Hypomagnesemia Chronically low magnesium, likely secondary to severe alcohol use disorder. Given several doses of IV mag over the course of his hospitalization.  Alcohol use disorder, severe, dependence ([place]) Patient was monitored with CIWA protocol. Patient did well and had no withdrawal symptoms at time of discharge.  PCP follow-up recommendations 1.)  Follow-up on A-fib and discuss medication compliance 2.)  Engage in substance use counseling 3.)  Recheck magnesium and electrolytes 4.)  Patient may need additional social work assistance as outpatient  Discharge Diagnoses/Problem List:  Atrial fibrillation with RVR (HCC) Hypokalemia Hypomagnesemia Pressure injury of skin Macrocytic anemia Alcohol use disorder, severe, dependence ([place])   Disposition: Home  Discharge Condition: Stable  Discharge Exam: Per my progress note General: Not in  acute distress Cardiovascular: Regular rate and rhythm on exam.  No murmurs rubs or gallops Respiratory: Clear to auscultation bilaterally, normal work of breathing on room air  Vitals:   03/19/22 0428 03/19/22 0719  BP: (!) 139/96 (!) 134/99  Pulse: 88 86  Resp: 20 18  Temp: 97.9 F (36.6 C) 97.7 F (36.5 C)  SpO2: 99% 98%    Significant Procedures: None  Significant Labs and Imaging:  Recent Labs  Lab 03/19/22 0324  WBC 4.2  HGB 9.3*  HCT 28.1*  PLT 219   Recent Labs  Lab 03/18/22 0652 03/19/22 0324  NA 137 134*  K 4.3 4.2  CL 111 108  CO2 21* 21*  GLUCOSE 89 89  BUN 8 8  CREATININE 0.68 0.68  CALCIUM 7.1* 7.4*  MG 1.3* 1.4*    ECHOCARDIOGRAM COMPLETE  Result Date: 03/17/2022    ECHOCARDIOGRAM REPORT   Patient Name:   Anthony [name] Date of Exam: 03/17/2022 Medical Rec #:  [id number]    Height:       70.0 in Accession #:    [id number]   Weight:       154.3 lb Date of Birth:  [birth year]    BSA:          1.869 m Patient Age:    38 years     BP:           119/83 mmHg Patient Gender: M            HR:           84 bpm. Exam Location:  Inpatient Procedure: 2D Echo, Cardiac Doppler and Color Doppler Indications:    CHF-Acute Systolic E52.77  History:  Patient has prior history of Echocardiogram examinations, most                 recent 09/29/2020. Risk Factors:Current Smoker, Hypertension and                 ETOH.  Sonographer:    Bernadene Person RDCS Referring Phys: [id number] [place] T [facility]  Sonographer Comments: Image acquisition challenging due to respiratory motion. IMPRESSIONS  1. Left ventricular ejection fraction, by estimation, is 55 to 60%. The left ventricle has normal function. The left ventricle has no regional wall motion abnormalities. Left ventricular diastolic parameters are consistent with Grade III diastolic dysfunction (restrictive). Elevated left atrial pressure.  2. Right ventricular systolic function is normal. The right ventricular size is mildly enlarged. There  is normal pulmonary artery systolic pressure. The estimated right ventricular systolic pressure is 44.0 mmHg.  3. Left atrial size was mildly dilated.  4. The mitral valve is abnormal. Mild to moderate mitral valve regurgitation. No evidence of mitral stenosis.  5. The aortic valve is tricuspid. There is mild calcification of the aortic valve. There is mild thickening of the aortic valve. Aortic valve regurgitation is not visualized. No aortic stenosis is present.  6. The inferior vena cava is normal in size with greater than 50% respiratory variability, suggesting right atrial pressure of 3 mmHg. Comparison(s): Mitral regurgitation increased from prior. FINDINGS  Left Ventricle: Left ventricular ejection fraction, by estimation, is 55 to 60%. The left ventricle has normal function. The left ventricle has no regional wall motion abnormalities. The left ventricular internal cavity size was normal in size. There is  no left ventricular hypertrophy. Left ventricular diastolic parameters are consistent with Grade III diastolic dysfunction (restrictive). Elevated left atrial pressure. Right Ventricle: The right ventricular size is mildly enlarged. No increase in right ventricular wall thickness. Right ventricular systolic function is normal. There is normal pulmonary artery systolic pressure. The tricuspid regurgitant velocity is 2.82  m/s, and with an assumed right atrial pressure of 3 mmHg, the estimated right ventricular systolic pressure is 34.7 mmHg. Left Atrium: Left atrial size was mildly dilated. Right Atrium: Right atrial size was normal in size. Pericardium: There is no evidence of pericardial effusion. Mitral Valve: Mitral valve billowing. The mitral valve is abnormal. Mild to moderate mitral valve regurgitation. No evidence of mitral valve stenosis. Tricuspid Valve: The tricuspid valve is normal in structure. Tricuspid valve regurgitation is mild . No evidence of tricuspid stenosis. Aortic Valve: The aortic  valve is tricuspid. There is mild calcification of the aortic valve. There is mild thickening of the aortic valve. Aortic valve regurgitation is not visualized. No aortic stenosis is present. Pulmonic Valve: The pulmonic valve was normal in structure. Pulmonic valve regurgitation is not visualized. No evidence of pulmonic stenosis. Aorta: The aortic root and ascending aorta are structurally normal, with no evidence of dilitation. Venous: The inferior vena cava is normal in size with greater than 50% respiratory variability, suggesting right atrial pressure of 3 mmHg. IAS/Shunts: No atrial level shunt detected by color flow Doppler.  LEFT VENTRICLE PLAX 2D LVIDd:         4.20 cm      Diastology LVIDs:         3.30 cm      LV e' medial:    8.84 cm/s LV PW:         0.70 cm      LV E/e' medial:  9.4 LV IVS:  0.80 cm      LV e' lateral:   14.10 cm/s LVOT diam:     2.10 cm      LV E/e' lateral: 5.9 LV SV:         53 LV SV Index:   28 LVOT Area:     3.46 cm  LV Volumes (MOD) LV vol d, MOD A2C: 115.0 ml LV vol d, MOD A4C: 99.9 ml LV vol s, MOD A2C: 58.7 ml LV vol s, MOD A4C: 33.9 ml LV SV MOD A2C:     56.3 ml LV SV MOD A4C:     99.9 ml LV SV MOD BP:      60.0 ml RIGHT VENTRICLE RV S prime:     11.60 cm/s TAPSE (M-mode): 2.2 cm LEFT ATRIUM             Index        RIGHT ATRIUM           Index LA diam:        4.70 cm 2.51 cm/m   RA Area:     18.50 cm LA Vol (A2C):   64.8 ml 34.66 ml/m  RA Volume:   45.40 ml  24.29 ml/m LA Vol (A4C):   68.5 ml 36.64 ml/m LA Biplane Vol: 69.6 ml 37.23 ml/m  AORTIC VALVE LVOT Vmax:   92.80 cm/s LVOT Vmean:  61.200 cm/s LVOT VTI:    0.152 m  AORTA Ao Root diam: 3.70 cm Ao Asc diam:  3.20 cm MITRAL VALVE                  TRICUSPID VALVE MV Area (PHT): 4.89 cm       TR Peak grad:   31.8 mmHg MV Decel Time: 155 msec       TR Vmax:        282.00 cm/s MR Peak grad:    81.0 mmHg MR Mean grad:    55.0 mmHg    SHUNTS MR Vmax:         450.00 cm/s  Systemic VTI:  0.15 m MR Vmean:        351.0  cm/s   Systemic Diam: 2.10 cm MR PISA:         1.01 cm MR PISA Eff ROA: 9 mm MR PISA Radius:  0.40 cm MV E velocity: 82.90 cm/s MV A velocity: 37.40 cm/s MV E/A ratio:  2.22 Rudean Haskell MD Electronically signed by Rudean Haskell MD Signature Date/Time: 03/17/2022/9:30:41 AM    Final    CT Angio Chest PE W and/or Wo Contrast  Result Date: 03/15/2022 CLINICAL DATA:  Suspected pulmonary embolism, elevated D-dimer. EXAM: CT ANGIOGRAPHY CHEST WITH CONTRAST TECHNIQUE: Multidetector CT imaging of the chest was performed using the standard protocol during bolus administration of intravenous contrast. Multiplanar CT image reconstructions and MIPs were obtained to evaluate the vascular anatomy. RADIATION DOSE REDUCTION: This exam was performed according to the departmental dose-optimization program which includes automated exposure control, adjustment of the mA and/or kV according to patient size and/or use of iterative reconstruction technique. CONTRAST:  55m OMNIPAQUE IOHEXOL 350 MG/ML SOLN COMPARISON:  October 02, 2020 FINDINGS: Cardiovascular: The thoracic aorta is normal in appearance, without evidence of aortic aneurysm. Satisfactory opacification of the pulmonary arteries to the segmental level. No evidence of pulmonary embolism. There is mild cardiomegaly with marked severity coronary artery calcification. No pericardial effusion. Mediastinum/Nodes: Subcentimeter AP window and paratracheal lymph nodes are seen. Thyroid gland, trachea, and esophagus demonstrate  no significant findings. Lungs/Pleura: Mild atelectasis is seen within the posterior aspects of the bilateral lower lobes. There is no evidence of a pleural effusion or pneumothorax. Upper Abdomen: Multiple surgical sutures are seen within the gastric region. Surgical clips are also noted in between the body of the stomach and spleen. Musculoskeletal: Healing fracture deformities are seen along the posterior medial aspects of the eighth, ninth  and tenth right ribs. Additional healing fracture deformities of the posterior tenth, eleventh and twelfth left ribs are noted. Chronic posterior ninth, tenth and eleventh right rib fractures are seen seen. Multilevel degenerative changes seen throughout the thoracic spine. Review of the MIP images confirms the above findings. IMPRESSION: 1. No evidence of pulmonary embolism. 2. Mild posterior bilateral lower lobe atelectasis. 3. Multiple bilateral chronic and healing fracture deformities, as described above. Electronically Signed   By: Virgina Norfolk M.D.   On: 03/15/2022 19:50   CT Head Wo Contrast  Result Date: 03/15/2022 CLINICAL DATA:  Nevertheless EXAM: CT HEAD WITHOUT CONTRAST TECHNIQUE: Contiguous axial images were obtained from the base of the skull through the vertex without intravenous contrast. RADIATION DOSE REDUCTION: This exam was performed according to the departmental dose-optimization program which includes automated exposure control, adjustment of the mA and/or kV according to patient size and/or use of iterative reconstruction technique. COMPARISON:  Head CT dated June 18, 2021 FINDINGS: Brain: Chronic white matter ischemic change. Unchanged generalized atrophy. No evidence of acute infarction, hemorrhage, hydrocephalus, extra-axial collection or mass lesion/mass effect. Vascular: No hyperdense vessel or unexpected calcification. Skull: Normal. Negative for fracture or focal lesion. Sinuses/Orbits: No acute finding. Other: None. IMPRESSION: No acute intracranial abnormality. Electronically Signed   By: Yetta Glassman M.D.   On: 03/15/2022 17:47   DG Foot 2 Views Left  Result Date: 03/15/2022 CLINICAL DATA:  Homeless, ETOH, foot pain EXAM: LEFT FOOT - 2 VIEW COMPARISON:  None Available. FINDINGS: No fracture or dislocation is seen. The joint spaces are preserved. Visualized soft tissues are notable for vascular calcifications. IMPRESSION: Negative. Electronically Signed   By:  Julian Hy M.D.   On: 03/15/2022 17:42   DG Chest 1 View  Result Date: 03/15/2022 CLINICAL DATA:  Homeless, ETOH, foot pain EXAM: CHEST  1 VIEW COMPARISON:  09/22/2020 FINDINGS: Lungs are clear.  No pleural effusion or pneumothorax. Eventration of the right hemidiaphragm. The heart is normal in size. Surgical clips at the GE junction. IMPRESSION: No evidence of acute cardiopulmonary disease. Electronically Signed   By: Julian Hy M.D.   On: 03/15/2022 17:42   DG Ankle Complete Right  Result Date: 03/15/2022 CLINICAL DATA:  Patient found with foot swelling and inflammation. EXAM: RIGHT ANKLE - COMPLETE 3+ VIEW COMPARISON:  None Available. FINDINGS: Two surgical pins extend through the medial malleolus. Deformity of the distal fibula is consistent with a healed fracture. No acute fractures are noted. No dislocation. No bony erosion. Mild soft tissue swelling. No other acute abnormalities. Vascular calcifications. IMPRESSION: Sequela of remote healed medial malleolar and fibular fractures. No acute fractures. No bony erosion. Electronically Signed   By: Dorise Bullion III M.D.   On: 03/15/2022 17:03   DG Ankle Complete Left  Result Date: 03/15/2022 CLINICAL DATA:  Swelling. EXAM: LEFT ANKLE COMPLETE - 3+ VIEW COMPARISON:  None Available. FINDINGS: Vascular calcifications. No fractures. No bony erosion. No other bony abnormalities. Bilateral soft tissue swelling. IMPRESSION: Bilateral soft tissue swelling. No bony erosion or fracture. No dislocation. Electronically Signed   By: Dorise Bullion III M.D.  On: 03/15/2022 17:01     Results/Tests Pending at Time of Discharge: None  Discharge Medications:  Allergies as of 03/19/2022       Reactions   Other Itching, Other (See Comments)   Seasonal allergies- Itchy eyes, runny nose, congestion        Medication List     STOP taking these medications    MELATONIN GUMMIES PO   methocarbamol 750 MG tablet Commonly known as:  ROBAXIN   NON FORMULARY   PREVAGEN PO   sertraline 100 MG tablet Commonly known as: ZOLOFT   sertraline 50 MG tablet Commonly known as: ZOLOFT       TAKE these medications    Acetaminophen 500 MG capsule Take 500-1,000 mg by mouth every 6 (six) hours as needed for fever.   amiodarone 200 MG tablet Commonly known as: PACERONE TAKE 1 TABLET (200 MG TOTAL) BY MOUTH DAILY.   ascorbic acid 500 MG tablet Commonly known as: VITAMIN C Take 500 mg by mouth daily as needed (for supplementation).   B Complex Plus Tabs Take 1 tablet by mouth daily as needed (for supplementation).   CALCIUM PO Take 1 tablet by mouth 2 (two) times a week.   Centrum Silver 50+Men Tabs Take 1 tablet by mouth daily with breakfast.   ferrous sulfate 325 (65 FE) MG tablet Take 1 tablet (325 mg total) by mouth daily as needed (for supplementation). What changed:  when to take this reasons to take this Another medication with the same name was removed. Continue taking this medication, and follow the directions you see here.   folic acid 1 MG tablet Commonly known as: FOLVITE Take 1 tablet (1 mg total) by mouth daily. Start taking on: March 20, 2022   furosemide 20 MG tablet Commonly known as: LASIX TAKE 1 TABLET (20 MG TOTAL) BY MOUTH DAILY.   Gas-X 80 MG chewable tablet Generic drug: simethicone Chew 80-160 mg by mouth every 6 (six) hours as needed for flatulence.   loratadine 10 MG tablet Commonly known as: CLARITIN TAKE 1 TABLET (10 MG TOTAL) BY MOUTH DAILY.   magnesium chloride 64 MG Tbec SR tablet Commonly known as: SLOW-MAG Take 1 tablet (64 mg total) by mouth daily.   metoprolol succinate 25 MG 24 hr tablet Commonly known as: TOPROL-XL Take 1/2 tablet (12.5 mg total) by mouth daily. Start taking on: March 20, 2022 What changed: how much to take   mometasone-formoterol 100-5 MCG/ACT Aero Commonly known as: DULERA INHALE 2 PUFFS INTO THE LUNGS TWO TIMES DAILY.    rivaroxaban 20 MG Tabs tablet Commonly known as: XARELTO TAKE 1 TABLET (20 MG TOTAL) BY MOUTH DAILY WITH SUPPER.   rosuvastatin 10 MG tablet Commonly known as: CRESTOR TAKE 1 TABLET (10 MG TOTAL) BY MOUTH DAILY.   thiamine 100 MG tablet Commonly known as: VITAMIN B1 Take 1 tablet (100 mg total) by mouth daily. Start taking on: March 20, 2022   VITAMIN D3 PO Take 1 capsule by mouth daily as needed (for supplementation).   ZINC PO Take 1 tablet by mouth daily as needed (for supplementation).               Durable Medical Equipment  (From admission, onward)           Start     Ordered   03/18/22 0916  For home use only DME 4 wheeled rolling walker with seat  Once       Question:  Patient needs a walker to  treat with the following condition  Answer:  Unstable gait   03/18/22 0915            Discharge Instructions: Please refer to Patient Instructions section of EMR for full details.  Patient was counseled important signs and symptoms that should prompt return to medical care, changes in medications, dietary instructions, activity restrictions, and follow up appointments.   Follow-Up Appointments:  Follow-up Information     Jacelyn Grip, MD. Go on 03/27/2022.   Specialty: Family Medicine Why: Follow-up appointment with Dr. Biagio Borg partner Dr. Joelyn Oms on Friday, September 8 at 9:45 AM Contact information: [place] [address] [phone]         Burnell Blanks, MD .   Specialty: Cardiology Contact information: [place]. [address] [place] [address] [phone]                 Leslie Dales, DO 03/19/2022, 11:09 AM PGY-1, [place]   I have evaluated this patient along with Dr. Nelda Bucks and reviewed the above note, making necessary revisions.  Pearla Dubonnet, MD 03/19/2022, 1:10 PM PGY-2, [place]

## 2022-03-19 NOTE — Plan of Care (Signed)
  Problem: Education: Goal: Ability to demonstrate management of disease process will improve Outcome: Progressing   Problem: Clinical Measurements: Goal: Ability to maintain clinical measurements within normal limits will improve Outcome: Progressing   Problem: Clinical Measurements: Goal: Cardiovascular complication will be avoided Outcome: Progressing   Problem: Activity: Goal: Risk for activity intolerance will decrease Outcome: Progressing

## 2022-03-20 LAB — CULTURE, BLOOD (ROUTINE X 2)
Culture: NO GROWTH
Culture: NO GROWTH
Special Requests: ADEQUATE
Special Requests: ADEQUATE

## 2022-03-23 ENCOUNTER — Other Ambulatory Visit: Payer: Self-pay

## 2022-03-23 ENCOUNTER — Emergency Department (HOSPITAL_COMMUNITY): Payer: 59

## 2022-03-23 ENCOUNTER — Encounter (HOSPITAL_COMMUNITY): Payer: Self-pay

## 2022-03-23 ENCOUNTER — Observation Stay (HOSPITAL_COMMUNITY)
Admission: EM | Admit: 2022-03-23 | Discharge: 2022-03-25 | Disposition: A | Discharge status: Home / Self Care | Payer: 59 | Attending: Emergency Medicine | Admitting: Emergency Medicine

## 2022-03-23 DIAGNOSIS — F1010 Alcohol abuse, uncomplicated: Secondary | ICD-10-CM

## 2022-03-23 DIAGNOSIS — F32A Depression, unspecified: Secondary | ICD-10-CM | POA: Diagnosis present

## 2022-03-23 DIAGNOSIS — Z5900 Homelessness unspecified: Secondary | ICD-10-CM | POA: Diagnosis not present

## 2022-03-23 DIAGNOSIS — Z7901 Long term (current) use of anticoagulants: Secondary | ICD-10-CM | POA: Diagnosis not present

## 2022-03-23 DIAGNOSIS — R0902 Hypoxemia: Secondary | ICD-10-CM

## 2022-03-23 DIAGNOSIS — I480 Paroxysmal atrial fibrillation: Secondary | ICD-10-CM

## 2022-03-23 DIAGNOSIS — E785 Hyperlipidemia, unspecified: Secondary | ICD-10-CM

## 2022-03-23 DIAGNOSIS — Z86711 Personal history of pulmonary embolism: Secondary | ICD-10-CM | POA: Insufficient documentation

## 2022-03-23 DIAGNOSIS — Z85828 Personal history of other malignant neoplasm of skin: Secondary | ICD-10-CM | POA: Insufficient documentation

## 2022-03-23 DIAGNOSIS — Z20822 Contact with and (suspected) exposure to covid-19: Secondary | ICD-10-CM | POA: Diagnosis not present

## 2022-03-23 DIAGNOSIS — Z79899 Other long term (current) drug therapy: Secondary | ICD-10-CM | POA: Diagnosis not present

## 2022-03-23 DIAGNOSIS — R262 Difficulty in walking, not elsewhere classified: Secondary | ICD-10-CM | POA: Insufficient documentation

## 2022-03-23 DIAGNOSIS — I110 Hypertensive heart disease with heart failure: Secondary | ICD-10-CM | POA: Insufficient documentation

## 2022-03-23 DIAGNOSIS — J9601 Acute respiratory failure with hypoxia: Secondary | ICD-10-CM | POA: Diagnosis not present

## 2022-03-23 DIAGNOSIS — I5033 Acute on chronic diastolic (congestive) heart failure: Secondary | ICD-10-CM | POA: Diagnosis not present

## 2022-03-23 DIAGNOSIS — Z7982 Long term (current) use of aspirin: Secondary | ICD-10-CM | POA: Diagnosis not present

## 2022-03-23 DIAGNOSIS — R0609 Other forms of dyspnea: Secondary | ICD-10-CM

## 2022-03-23 DIAGNOSIS — R531 Weakness: Secondary | ICD-10-CM | POA: Insufficient documentation

## 2022-03-23 DIAGNOSIS — R0602 Shortness of breath: Secondary | ICD-10-CM | POA: Diagnosis present

## 2022-03-23 LAB — COMPREHENSIVE METABOLIC PANEL
ALT: 26 U/L (ref 0–44)
AST: 52 U/L — ABNORMAL HIGH (ref 15–41)
Albumin: 2.9 g/dL — ABNORMAL LOW (ref 3.5–5.0)
Alkaline Phosphatase: 106 U/L (ref 38–126)
Anion gap: 10 (ref 5–15)
BUN: 15 mg/dL (ref 8–23)
CO2: 22 mmol/L (ref 22–32)
Calcium: 8.9 mg/dL (ref 8.9–10.3)
Chloride: 106 mmol/L (ref 98–111)
Creatinine, Ser: 0.73 mg/dL (ref 0.61–1.24)
GFR, Estimated: >60 mL/min (ref >60)
Glucose, Bld: 81 mg/dL (ref 70–99)
Potassium: 4.5 mmol/L (ref 3.5–5.1)
Sodium: 138 mmol/L (ref 135–145)
Total Bilirubin: 1.2 mg/dL (ref 0.3–1.2)
Total Protein: 7.3 g/dL (ref 6.5–8.1)

## 2022-03-23 LAB — CBC WITH DIFFERENTIAL/PLATELET
Abs Immature Granulocytes: 0.02 10*3/uL (ref 0.00–0.07)
Basophils Absolute: 0.1 10*3/uL (ref 0.0–0.1)
Basophils Relative: 2 %
Eosinophils Absolute: 0 10*3/uL (ref 0.0–0.5)
Eosinophils Relative: 1 %
HCT: 33.2 % — ABNORMAL LOW (ref 39.0–52.0)
Hemoglobin: 10.6 g/dL — ABNORMAL LOW (ref 13.0–17.0)
Immature Granulocytes: 1 %
Lymphocytes Relative: 33 %
Lymphs Abs: 1.4 10*3/uL (ref 0.7–4.0)
MCH: 35.1 pg — ABNORMAL HIGH (ref 26.0–34.0)
MCHC: 31.9 g/dL (ref 30.0–36.0)
MCV: 109.9 fL — ABNORMAL HIGH (ref 80.0–100.0)
Monocytes Absolute: 0.6 10*3/uL (ref 0.1–1.0)
Monocytes Relative: 15 %
Neutro Abs: 2.1 10*3/uL (ref 1.7–7.7)
Neutrophils Relative %: 48 %
Platelets: 317 10*3/uL (ref 150–400)
RBC: 3.02 MIL/uL — ABNORMAL LOW (ref 4.22–5.81)
RDW: 15.7 % — ABNORMAL HIGH (ref 11.5–15.5)
WBC: 4.2 10*3/uL (ref 4.0–10.5)
nRBC: 0 % (ref 0.0–0.2)

## 2022-03-23 LAB — RESP PANEL BY RT-PCR (FLU A&B, COVID) ARPGX2
Influenza A by PCR: NEGATIVE
Influenza B by PCR: NEGATIVE
SARS Coronavirus 2 by RT PCR: NEGATIVE

## 2022-03-23 LAB — TROPONIN I (HIGH SENSITIVITY)
Troponin I (High Sensitivity): 60 ng/L — ABNORMAL HIGH (ref <18)
Troponin I (High Sensitivity): 8 ng/L (ref <18)

## 2022-03-23 LAB — BRAIN NATRIURETIC PEPTIDE: B Natriuretic Peptide: 583.7 pg/mL — ABNORMAL HIGH (ref 0.0–100.0)

## 2022-03-23 MED ORDER — B COMPLEX-C PO TABS
1.0000 | ORAL_TABLET | Freq: Every day | ORAL | Status: DC | PRN
  Administered 2022-03-24: 1 via ORAL
  Filled 2022-03-23: qty 1

## 2022-03-23 MED ORDER — ZINC SULFATE 220 (50 ZN) MG PO CAPS: 220.0000 mg | ORAL_CAPSULE | Freq: Every day | ORAL | Status: DC | PRN

## 2022-03-23 MED ORDER — AMIODARONE HCL 200 MG PO TABS
200.0000 mg | ORAL_TABLET | Freq: Every day | ORAL | Status: DC
  Filled 2022-03-23: qty 1

## 2022-03-23 MED ORDER — VITAMIN D 25 MCG (1000 UNIT) PO TABS
1000.0000 [IU] | ORAL_TABLET | Freq: Every day | ORAL | Status: DC
  Administered 2022-03-24 – 2022-03-25 (×2): 1000 [IU] via ORAL
  Filled 2022-03-23 (×2): qty 1

## 2022-03-23 MED ORDER — LORATADINE 10 MG PO TABS
10.0000 mg | ORAL_TABLET | Freq: Every day | ORAL | Status: DC
  Administered 2022-03-24 – 2022-03-25 (×2): 10 mg via ORAL
  Filled 2022-03-23 (×2): qty 1

## 2022-03-23 MED ORDER — ACETAMINOPHEN 650 MG RE SUPP: 650.0000 mg | Freq: Four times a day (QID) | RECTAL | Status: DC | PRN

## 2022-03-23 MED ORDER — FUROSEMIDE 40 MG PO TABS
20.0000 mg | ORAL_TABLET | Freq: Once | ORAL | Status: AC
  Administered 2022-03-23: 20 mg via ORAL
  Filled 2022-03-23: qty 1

## 2022-03-23 MED ORDER — ROSUVASTATIN CALCIUM 10 MG PO TABS
10.0000 mg | ORAL_TABLET | Freq: Every day | ORAL | Status: DC
  Administered 2022-03-23 – 2022-03-25 (×3): 10 mg via ORAL
  Filled 2022-03-23 (×3): qty 1

## 2022-03-23 MED ORDER — SERTRALINE HCL 50 MG PO TABS
50.0000 mg | ORAL_TABLET | Freq: Every day | ORAL | Status: DC
  Administered 2022-03-24 – 2022-03-25 (×2): 50 mg via ORAL
  Filled 2022-03-23 (×2): qty 1

## 2022-03-23 MED ORDER — ONDANSETRON HCL 4 MG/2ML IJ SOLN: 4.0000 mg | Freq: Four times a day (QID) | INTRAMUSCULAR | Status: DC | PRN

## 2022-03-23 MED ORDER — IOHEXOL 350 MG/ML SOLN
100.0000 mL | Freq: Once | INTRAVENOUS | Status: AC | PRN
  Administered 2022-03-23: 100 mL via INTRAVENOUS

## 2022-03-23 MED ORDER — ACETAMINOPHEN 325 MG PO TABS
650.0000 mg | ORAL_TABLET | Freq: Four times a day (QID) | ORAL | Status: DC | PRN
  Administered 2022-03-23 – 2022-03-25 (×3): 650 mg via ORAL
  Filled 2022-03-23 (×3): qty 2

## 2022-03-23 MED ORDER — FUROSEMIDE 10 MG/ML IJ SOLN
40.0000 mg | Freq: Two times a day (BID) | INTRAMUSCULAR | Status: DC
  Administered 2022-03-23 – 2022-03-24 (×3): 40 mg via INTRAVENOUS
  Filled 2022-03-23 (×3): qty 4

## 2022-03-23 MED ORDER — MAGNESIUM HYDROXIDE 400 MG/5ML PO SUSP
30.0000 mL | Freq: Every day | ORAL | Status: DC | PRN
Start: 2022-03-23 — End: 2022-03-25

## 2022-03-23 MED ORDER — ROSUVASTATIN CALCIUM 10 MG PO TABS: 10.0000 mg | ORAL_TABLET | Freq: Every day | ORAL | Status: DC

## 2022-03-23 MED ORDER — SODIUM CHLORIDE (PF) 0.9 % IJ SOLN
INTRAMUSCULAR | Status: AC
  Filled 2022-03-23: qty 50

## 2022-03-23 MED ORDER — METHOCARBAMOL 500 MG PO TABS
750.0000 mg | ORAL_TABLET | Freq: Four times a day (QID) | ORAL | Status: DC
  Administered 2022-03-23 – 2022-03-25 (×6): 750 mg via ORAL
  Filled 2022-03-23 (×6): qty 2

## 2022-03-23 MED ORDER — ONDANSETRON HCL 4 MG PO TABS: 4.0000 mg | ORAL_TABLET | Freq: Four times a day (QID) | ORAL | Status: DC | PRN

## 2022-03-23 MED ORDER — ASPIRIN 325 MG PO TBEC
325.0000 mg | DELAYED_RELEASE_TABLET | ORAL | Status: DC | PRN
  Administered 2022-03-24: 325 mg via ORAL
  Filled 2022-03-23: qty 1

## 2022-03-23 MED ORDER — RIVAROXABAN 20 MG PO TABS
20.0000 mg | ORAL_TABLET | Freq: Every day | ORAL | Status: DC
  Administered 2022-03-24: 20 mg via ORAL
  Filled 2022-03-23: qty 1

## 2022-03-23 MED ORDER — THIAMINE HCL 100 MG PO TABS
100.0000 mg | ORAL_TABLET | Freq: Every day | ORAL | Status: DC
  Administered 2022-03-24 – 2022-03-25 (×2): 100 mg via ORAL
  Filled 2022-03-23 (×4): qty 1

## 2022-03-23 MED ORDER — ADULT MULTIVITAMIN W/MINERALS CH
1.0000 | ORAL_TABLET | Freq: Every day | ORAL | Status: DC
  Administered 2022-03-24 – 2022-03-25 (×2): 1 via ORAL
  Filled 2022-03-23 (×2): qty 1

## 2022-03-23 MED ORDER — AMIODARONE HCL 200 MG PO TABS
200.0000 mg | ORAL_TABLET | Freq: Every day | ORAL | Status: DC
  Administered 2022-03-23 – 2022-03-25 (×3): 200 mg via ORAL
  Filled 2022-03-23 (×3): qty 1

## 2022-03-23 MED ORDER — FERROUS SULFATE 325 (65 FE) MG PO TABS
325.0000 mg | ORAL_TABLET | Freq: Every day | ORAL | Status: DC | PRN
  Administered 2022-03-24: 325 mg via ORAL
  Filled 2022-03-23: qty 1

## 2022-03-23 MED ORDER — SIMETHICONE 80 MG PO CHEW: 80.0000 mg | CHEWABLE_TABLET | Freq: Four times a day (QID) | ORAL | Status: DC | PRN

## 2022-03-23 MED ORDER — MAGNESIUM CHLORIDE 64 MG PO TBEC
1.0000 | DELAYED_RELEASE_TABLET | Freq: Every day | ORAL | Status: DC
Start: 2022-03-24 — End: 2022-03-25
  Administered 2022-03-24 – 2022-03-25 (×2): 64 mg via ORAL
  Filled 2022-03-23 (×2): qty 1

## 2022-03-23 MED ORDER — RIVAROXABAN 20 MG PO TABS
20.0000 mg | ORAL_TABLET | Freq: Once | ORAL | Status: AC
  Administered 2022-03-23: 20 mg via ORAL
  Filled 2022-03-23: qty 1

## 2022-03-23 MED ORDER — METOPROLOL SUCCINATE ER 25 MG PO TB24: 12.5000 mg | ORAL_TABLET | Freq: Every day | ORAL | Status: DC

## 2022-03-23 MED ORDER — VITAMIN C 500 MG PO TABS
500.0000 mg | ORAL_TABLET | Freq: Every day | ORAL | Status: DC | PRN
  Administered 2022-03-24: 500 mg via ORAL
  Filled 2022-03-23: qty 1

## 2022-03-23 MED ORDER — TRAZODONE HCL 50 MG PO TABS
25.0000 mg | ORAL_TABLET | Freq: Every evening | ORAL | Status: DC | PRN
  Administered 2022-03-23: 25 mg via ORAL
  Filled 2022-03-23: qty 1

## 2022-03-23 MED ORDER — FOLIC ACID 1 MG PO TABS
1.0000 mg | ORAL_TABLET | Freq: Every day | ORAL | Status: DC
  Administered 2022-03-24 – 2022-03-25 (×2): 1 mg via ORAL
  Filled 2022-03-23 (×2): qty 1

## 2022-03-23 MED ORDER — METOPROLOL SUCCINATE ER 25 MG PO TB24
12.5000 mg | ORAL_TABLET | Freq: Every day | ORAL | Status: DC
  Administered 2022-03-23 – 2022-03-24 (×2): 12.5 mg via ORAL
  Filled 2022-03-23 (×3): qty 1

## 2022-03-23 MED ORDER — CALCIUM CARBONATE 1250 (500 CA) MG PO TABS: 1250.0000 mg | ORAL_TABLET | ORAL | Status: DC

## 2022-03-23 NOTE — ED Notes (Signed)
Extra light green tube sent to lab with initial blood draw. CBC w/ diff added. Harlene Ramus RN contacted lab at this time to notify of add on order.

## 2022-03-23 NOTE — ED Provider Notes (Signed)
[facility] DEPT Provider Note   CSN: [id number] Arrival date & time: 03/23/22  1332     History  Chief Complaint  Patient presents with  . Shortness of Breath    Anthony [name] is a 66 y.o. male.   Shortness of Breath   66 year old male presents emergency department with complaints of shortness of breath and back pain.  Patient states that back pain has been present for approximate last 15 years and has been unchanged in intensity over the past several years.  Denies any repeat trauma to affected back.  Patient states that he has been short of breath since his previous hospital discharge on 03/19/2022.  He states that his shortness of breath has gotten slightly worse since his discharge.  Denies any associated chest pain.  States that shortness of breath is worse with physical activity and is relieved with rest.  Patient states he is homeless and was recently staying at a facility but was "put out."  He states he has not been able to take his daily medications since his prior discharge on 03/19/2022.  He has a history of alcohol abuse but states he has not had an alcoholic beverage since Friday where he consumed a 42 ounce beer.  Denies fever, chills, night sweats, cough, congestion, chest pain, abdominal pain, nausea, vomiting, diarrhea, weakness/sensory deficits in lower extremities, bowel/bladder dysfunction, history of IV drug use, recent steroid use, known cancer diagnosis.  Patient currently denies auditory/visual hallucinations, suicidal/homicidal ideation.  Patient is requesting to stay through dinner.   Past medical history significant for delirium tremens, rhabdomyolysis, alcohol use disorder, tobacco use disorder, hypertension, chronic low back pain, atrial fibrillation,  Home Medications Prior to Admission medications   Medication Sig Start Date End Date Taking? Authorizing Provider  acetaminophen (TYLENOL) 325 MG tablet Take 325-650 mg by mouth every  6 (six) hours as needed for headache (or pain). Patient not taking: Reported on 03/23/2022    [provider]  amiodarone (PACERONE) 200 MG tablet TAKE 1 TABLET (200 MG TOTAL) BY MOUTH DAILY. Patient not taking: Reported on 03/23/2022 03/19/22 03/19/23  Sharion Settler, DO  ascorbic acid (VITAMIN C) 500 MG tablet Take 500 mg by mouth daily as needed (for supplementation).    [provider]  aspirin EC 325 MG tablet Take 325 mg by mouth as needed (for pain). Patient not taking: Reported on 03/23/2022    [provider]  B Complex-Folic Acid (B COMPLEX PLUS) TABS Take 1 tablet by mouth daily as needed (for supplementation).    [provider]  CALCIUM PO Take 1 tablet by mouth 2 (two) times a week.    [provider]  Cholecalciferol (VITAMIN D3 PO) Take 1 capsule by mouth daily as needed (for supplementation).    [provider]  ferrous sulfate 325 (65 FE) MG tablet Take 1 tablet (325 mg total) by mouth daily as needed (for supplementation). 03/19/22   Sharion Settler, DO  folic acid (FOLVITE) 1 MG tablet Take 1 tablet (1 mg total) by mouth daily. Patient not taking: Reported on 03/23/2022 03/20/22   Sharion Settler, DO  furosemide (LASIX) 20 MG tablet TAKE 1 TABLET (20 MG TOTAL) BY MOUTH DAILY. Patient not taking: Reported on 03/23/2022 03/19/22 03/19/23  Sharion Settler, DO  loratadine (CLARITIN) 10 MG tablet TAKE 1 TABLET (10 MG TOTAL) BY MOUTH DAILY. Patient taking differently: Take 10 mg by mouth daily. 10/08/20 03/23/22  Lilland, Alana, DO  magnesium chloride (SLOW-MAG) 64 MG  TBEC SR tablet Take 1 tablet (64 mg total) by mouth daily. 10/09/20   Lilland, Alana, DO  methocarbamol (ROBAXIN) 750 MG tablet Take 750 mg by mouth 4 (four) times daily. Patient not taking: Reported on 03/23/2022    [provider]  metoprolol succinate (TOPROL-XL) 25 MG 24 hr tablet Take 1/2 tablet (12.5 mg total) by mouth daily. Patient not taking: Reported  on 03/23/2022 03/20/22   Sharion Settler, DO  mometasone-formoterol (DULERA) 100-5 MCG/ACT AERO INHALE 2 PUFFS INTO THE LUNGS TWO TIMES DAILY. Patient not taking: Reported on 03/23/2022 03/19/22 03/19/23  Sharion Settler, DO  Multiple Vitamins-Minerals (CENTRUM SILVER 50+MEN) TABS Take 1 tablet by mouth daily with breakfast.    [provider]  Multiple Vitamins-Minerals (ZINC PO) Take 1 tablet by mouth daily as needed (for supplementation).    [provider]  rivaroxaban (XARELTO) 20 MG TABS tablet TAKE 1 TABLET (20 MG TOTAL) BY MOUTH DAILY WITH SUPPER. Patient not taking: Reported on 03/23/2022 03/19/22 03/19/23  Sharion Settler, DO  rosuvastatin (CRESTOR) 10 MG tablet TAKE 1 TABLET (10 MG TOTAL) BY MOUTH DAILY. Patient not taking: Reported on 03/23/2022 03/19/22 03/19/23  Sharion Settler, DO  sertraline (ZOLOFT) 50 MG tablet Take 50 mg by mouth daily. Patient not taking: Reported on 03/23/2022    [provider]  simethicone (GAS-X) 80 MG chewable tablet Chew 80-160 mg by mouth every 6 (six) hours as needed for flatulence. Patient not taking: Reported on 03/23/2022    [provider]  thiamine (VITAMIN B1) 100 MG tablet Take 1 tablet (100 mg total) by mouth daily. Patient not taking: Reported on 03/23/2022 03/20/22   Sharion Settler, DO      Allergies    Other and Poison ivy extract    Review of Systems   Review of Systems  Respiratory:  Positive for shortness of breath.   All other systems reviewed and are negative.   Physical Exam Updated Vital Signs BP 129/86   Pulse (!) 103   Temp 98.4 F (36.9 C)   Resp 18   Ht '5\' 10"'$  (1.778 m)   Wt 61.2 kg   SpO2 100%   BMI 19.37 kg/m  Physical Exam Vitals and nursing note reviewed.  Constitutional:      General: He is not in acute distress.    Appearance: He is well-developed.  HENT:     Head: Normocephalic and atraumatic.  Eyes:     Extraocular Movements: Extraocular movements intact.      Conjunctiva/sclera: Conjunctivae normal.     Pupils: Pupils are equal, round, and reactive to light.  Cardiovascular:     Rate and Rhythm: Normal rate and regular rhythm.     Heart sounds: No murmur heard. Pulmonary:     Effort: Pulmonary effort is normal. No respiratory distress.     Breath sounds: Normal breath sounds. No wheezing, rhonchi or rales.  Abdominal:     Palpations: Abdomen is soft.     Tenderness: There is no abdominal tenderness.  Musculoskeletal:        General: No swelling.     Cervical back: Normal range of motion and neck supple.     Right lower leg: Edema present.     Left lower leg: Edema present.     Comments: 1-2+ bilateral lower extremity edema noted.  Skin:    General: Skin is warm and dry.     Capillary Refill: Capillary refill takes less than 2 seconds.  Neurological:     Mental  Status: He is alert.  Psychiatric:        Mood and Affect: Mood normal.     ED Results / Procedures / Treatments   Labs (all labs ordered are listed, but only abnormal results are displayed) Labs Reviewed  BRAIN NATRIURETIC PEPTIDE - Abnormal; Notable for the following components:      Result Value   B Natriuretic Peptide 583.7 (*)    All other components within normal limits  COMPREHENSIVE METABOLIC PANEL - Abnormal; Notable for the following components:   Albumin 2.9 (*)    AST 52 (*)    All other components within normal limits  CBC WITH DIFFERENTIAL/PLATELET - Abnormal; Notable for the following components:   RBC 3.02 (*)    Hemoglobin 10.6 (*)    HCT 33.2 (*)    MCV 109.9 (*)    MCH 35.1 (*)    RDW 15.7 (*)    All other components within normal limits  TROPONIN I (HIGH SENSITIVITY) - Abnormal; Notable for the following components:   Troponin I (High Sensitivity) 60 (*)    All other components within normal limits  RESP PANEL BY RT-PCR (FLU A&B, COVID) ARPGX2  BASIC METABOLIC PANEL  CBC  TROPONIN I (HIGH SENSITIVITY)    EKG None  Radiology CT Angio  Chest PE W/Cm &/Or Wo Cm  Result Date: 03/23/2022 CLINICAL DATA:  Pulmonary embolism (PE) suspected, high prob EXAM: CT ANGIOGRAPHY CHEST WITH CONTRAST TECHNIQUE: Multidetector CT imaging of the chest was performed using the standard protocol during bolus administration of intravenous contrast. Multiplanar CT image reconstructions and MIPs were obtained to evaluate the vascular anatomy. RADIATION DOSE REDUCTION: This exam was performed according to the departmental dose-optimization program which includes automated exposure control, adjustment of the mA and/or kV according to patient size and/or use of iterative reconstruction technique. CONTRAST:  177m OMNIPAQUE IOHEXOL 350 MG/ML SOLN COMPARISON:  Radiograph earlier today.  Chest CTA 1 week ago FINDINGS: Cardiovascular: There are no filling defects within the pulmonary arteries to suggest pulmonary embolus. The heart is normal in size, there are coronary artery calcifications. Aortic atherosclerosis and tortuosity, no acute aortic findings. No pericardial effusion. Mediastinum/Nodes: No enlarged mediastinal or hilar lymph nodes, stable subcentimeter short axis anterior paratracheal node. No esophageal wall thickening. Postsurgical change in the distal esophagus. No thyroid nodule. Lungs/Pleura: Minimal biapical pleuroparenchymal scarring. Dependent atelectasis, basilar assessment obscured by breathing motion. Benign calcified granuloma in the right upper lobe, needing no further follow-up. Minimal retained mucus in the trachea. No pleural fluid. Upper Abdomen: Postsurgical change of the proximal stomach. Additional surgical clips in the left upper quadrant. No acute upper abdominal findings. Musculoskeletal: Stable appearance of multiple healing and chronic lower rib fractures. No acute osseous findings. Stable small posterior disc osteophyte complex at T6-T7. No chest wall soft tissue abnormalities Review of the MIP images confirms the above findings.  IMPRESSION: 1. No pulmonary embolus or acute intrathoracic abnormality. 2. Coronary artery calcifications. Aortic Atherosclerosis (ICD10-I70.0). Electronically Signed   By: MKeith RakeM.D.   On: 03/23/2022 21:08   DG Chest 2 View  Result Date: 03/23/2022 CLINICAL DATA:  Shortness of breath EXAM: CHEST - 2 VIEW COMPARISON:  03/15/2022 FINDINGS: The heart size and mediastinal contours are within normal limits. Both lungs are clear. Disc degenerative disease of the thoracic spine. IMPRESSION: No acute abnormality of the lungs. Electronically Signed   By: ADelanna AhmadiM.D.   On: 03/23/2022 14:59    Procedures Procedures    Medications Ordered in  ED Medications  amiodarone (PACERONE) tablet 200 mg (200 mg Oral Given 03/23/22 1900)  metoprolol succinate (TOPROL-XL) 24 hr tablet 12.5 mg (12.5 mg Oral Given 03/23/22 1900)  rosuvastatin (CRESTOR) tablet 10 mg (10 mg Oral Given 03/23/22 1900)  sodium chloride (PF) 0.9 % injection (has no administration in time range)  aspirin EC tablet 325 mg (has no administration in time range)  amiodarone (PACERONE) tablet 200 mg (has no administration in time range)  metoprolol succinate (TOPROL-XL) 24 hr tablet 12.5 mg (has no administration in time range)  rosuvastatin (CRESTOR) tablet 10 mg (has no administration in time range)  sertraline (ZOLOFT) tablet 50 mg (has no administration in time range)  simethicone (MYLICON) chewable tablet 80-160 mg (has no administration in time range)  ferrous sulfate tablet 325 mg (has no administration in time range)  folic acid (FOLVITE) tablet 1 mg (has no administration in time range)  rivaroxaban (XARELTO) tablet 20 mg (has no administration in time range)  methocarbamol (ROBAXIN) tablet 750 mg (has no administration in time range)  ascorbic acid (VITAMIN C) tablet 500 mg (has no administration in time range)  B Complex Plus TABS 1 tablet (has no administration in time range)  Calcium (has no administration in time  range)  cholecalciferol (VITAMIN D3) 25 MCG (1000 UNIT) tablet 1,000 Units (has no administration in time range)  magnesium chloride (SLOW-MAG) 64 MG SR tablet 64 mg (has no administration in time range)  Centrum Silver 50+Men TABS 1 tablet (has no administration in time range)  Zinc TABS (has no administration in time range)  thiamine (VITAMIN B1) tablet 100 mg (has no administration in time range)  loratadine (CLARITIN) tablet 10 mg (has no administration in time range)  furosemide (LASIX) injection 40 mg (has no administration in time range)  acetaminophen (TYLENOL) tablet 650 mg (has no administration in time range)    Or  acetaminophen (TYLENOL) suppository 650 mg (has no administration in time range)  traZODone (DESYREL) tablet 25 mg (has no administration in time range)  magnesium hydroxide (MILK OF MAGNESIA) suspension 30 mL (has no administration in time range)  ondansetron (ZOFRAN) tablet 4 mg (has no administration in time range)    Or  ondansetron (ZOFRAN) injection 4 mg (has no administration in time range)  furosemide (LASIX) tablet 20 mg (20 mg Oral Given 03/23/22 1900)  rivaroxaban (XARELTO) tablet 20 mg (20 mg Oral Given 03/23/22 1900)  iohexol (OMNIPAQUE) 350 MG/ML injection 100 mL (100 mLs Intravenous Contrast Given 03/23/22 2033)    ED Course/ Medical Decision Making/ A&P Clinical Course as of 03/23/22 2222  Mon Mar 23, 2022  1940 Dr. Caryl Comes of cardiology was consulted regarding the patient.  He did not think patient's symptoms were related to cardiac origin.  No further recommendations made. [CR]  2012 Patient desaturated to 85% to 93% on room air with minimal ambulation. [CR]  2035 Hospital Dr. Sidney Ace was consulted regarding the patient he agreed with admission and assume further treatment/care. [CR]    Clinical Course User Index [CR] Wilnette Kales, PA                           Medical Decision Making Amount and/or Complexity of Data Reviewed Labs:  ordered. Radiology: ordered. ECG/medicine tests:  Decision-making details documented in ED Course.  Risk Prescription drug management. Decision regarding hospitalization.   This patient presents to the ED for concern of shortness of breath, this involves an extensive number  of treatment options, and is a complaint that carries with it a high risk of complications and morbidity.  The differential diagnosis includes CHF exacerbation, ACS/MI, pneumonia, COVID,    Co morbidities that complicate the patient evaluation  See HPI   Additional history obtained:  Additional history obtained from EMR External records from outside source obtained and reviewed including echocardiogram from 03/17/2022 indicating a left-ventricular ejection fraction of 55- 60% with normal right ventricular systolic function.   Lab Tests:  I Ordered, and personally interpreted labs.  The pertinent results include: No leukocytosis noted.  Mild anemia with hemoglobin of 10.6 which is near patient's baseline.  Platelets within normal range.  No electrolyte abnormalities.  Renal function within normal limits.  Respiratory viral panel negative.  Initial troponin of 60 with second of 8 with no new EKG findings of ischemia; doubt ACS.  BNP of 583.7; patient does not look clinically volume overloaded as lungs are clear to auscultation as well as chest x-ray/CTA chest shows no pulmonary vascular congestion so doubt CHF exacerbation.   Imaging Studies ordered:  I ordered imaging studies including chest x-ray, CT angio chest I independently visualized and interpreted imaging which showed  Chest x-ray: No acute cardiopulmonary abnormality CT angio chest: No pulmonary embolus or acute intrathoracic abnormality.  Coronary artery calcifications.  Aortic atherosclerosis. I agree with the radiologist interpretation  Cardiac Monitoring: / EKG:  The patient was maintained on a cardiac monitor.  I personally viewed and interpreted  the cardiac monitored which showed an underlying rhythm of: Sinus rhythm.   Consultations Obtained:  See ED course.  Problem List / ED Course / Critical interventions / Medication management  Shortness of breath I ordered medication including Lasix, Xarelto, amiodarone, metoprolol, Crestor for continued at home medicines.    Reevaluation of the patient after these medicines showed that the patient improved I have reviewed the patients home medicines and have made adjustments as needed   Social Determinants of Health:  Chronic alcohol use.   Test / Admission - Considered:  Vitals signs within normal range and stable throughout visit. Laboratory/imaging studies significant for: See above Patient ambulated and oxygen saturation dropped down to 85%.  Unsure of exact etiology of patient's hypoxia with ambulation.  Mild CHF exacerbation could be possibility.  Doubt pneumonia.  Doubt ACS.  Doubt PE.  Doubt dissection.  Admission to the hospital deemed necessary given patient's hypoxia with ambulation.  Treatment plan was discussed along with patient and he knowledge understanding was agreeable to said plan.  Proper consultations were performed as depicted in ED course.  Patient was stable upon admission to the hospital.        Final Clinical Impression(s) / ED Diagnoses Final diagnoses:  Hypoxia  Dyspnea on exertion    Rx / DC Orders ED Discharge Orders     None         Wilnette Kales, [place] 03/23/22 2222    Margette Fast, MD 03/26/22 [phone]

## 2022-03-23 NOTE — ED Notes (Signed)
Ambulated pt, pts SpO2 levels walking stayed between 85-93%

## 2022-03-23 NOTE — ED Notes (Signed)
Delta trop needed at 4:25 pm.

## 2022-03-23 NOTE — ED Notes (Signed)
Toc completed via phone with patient at this time.

## 2022-03-23 NOTE — ED Notes (Signed)
Patient reports he has had no money and no clothes to change into, has been wearing same clothes for 4 days.

## 2022-03-23 NOTE — ED Notes (Signed)
Patient homeless, says he was staying at a place, but was recently put out. All of his medications were reportedly put into storage so patient has no access to his daily medications. TOC consult added.

## 2022-03-23 NOTE — Progress Notes (Signed)
This CSW went to speak with the pt to find out more information about the pt's medications being in storage.   The pt shared that he was connected to the [facility] program through the [facility]. Pt reports he was staying at the [facility]. He shared that he was arrested a couple weeks ago by [facility] but states he is unsure why. The pt then reported he went to [facility] ED and stayed for 5 days. The pt feels that he is not medically okay to leave the hospital. He reported he walks a block and has shortness of breath. This CSW asked the pt if he expressed concerns to the provider and he states that he did.  The pt stated "If I am sent home, I will be back." The pt also expressed that he was hungry. This CSW asked NT, Jenny Reichmann, if he could receive a sandwich and and she stated he could after his EKG is complete. This CSW will reach out to Dr. Laverta [place] to inform him of the pt's concerns. This CSW also attached [employer] to the pt's AVS. 2nd shift CSW, [place], made aware. TOC following.

## 2022-03-23 NOTE — ED Triage Notes (Addendum)
Patient bib [facility], reports that patient was at [facility] today and called EMS. Patient reported he injured his back 15 years ago and has had exertional shobr since., patient wants to be evaluated today.  VS w EMS 120/60, rr 18, o2 sat 95% RA  Pain reports pain in back 5/10 in triage.  Patient reports he is fine in triage, and has no shobr when sitting or lying.

## 2022-03-23 NOTE — H&P (Signed)
[place]   PATIENT NAME: Anthony [name]    MR#:  [id number]  DATE OF BIRTH:  1955-07-22  DATE OF ADMISSION:  03/23/2022  PRIMARY CARE PHYSICIAN: Jacelyn Grip, MD   Patient is coming from: [facility] group home  REQUESTING/REFERRING PHYSICIAN: Wilnette Kales, PA   CHIEF COMPLAINT:   Chief Complaint  Patient presents with   Shortness of Breath    HISTORY OF PRESENT ILLNESS:  Anthony [name] is a 65 y.o. Caucasian male with medical history significant for alcohol use, atrial fibrillation, hypertension and grade 3 diastolic CHF, who presented to the emergency room with acute onset of worsening dyspnea after minimal exertion with associated mild orthopnea and cough productive of yellowish sputum without wheezing.  He denies any chest pain or palpitations.  No dysuria, oliguria or hematuria or flank pain.  His Pulsoxymeter was dropping to 85% upon ambulation in the ER on room air.  He denied any fever or chills.  The patient has not had his medications since discharge.  He was residing at [facility] group home for homeless.  He was recently admitted to [facility] for atrial fibrillation with RVR as well as altered mental status with alcohol intoxication and was discharged on 8/31.   ED Course: When he came to the ER, vital signs were within normal and later with rate was 21.  Labs revealed elevated BNP and albumin of 2.9 with AST 52.  High sensitive troponin was 16 and later 8 and CBC showed anemia better than previous levels with macrocytosis. EKG as reviewed by me : EKG showed normal sinus rhythm with a rate of 91 with PACs and borderline prolonged QT interval with QTc of 494 MS. Imaging: Two-view chest x-ray showed no acute cardiopulmonary disease.  The patient was given 20 mg of p.o. Lasix, Xarelto, Crestor and Toprol-XL were ordered to be resumed.  He will be admitted to an observation medical telemetry bed for further evaluation and management. PAST MEDICAL HISTORY:   Past Medical History:   Diagnosis Date   Actinic keratosis 11/27/2016   Alcohol abuse with alcohol-induced mood disorder ([place]) 07/18/2018   Alcohol use disorder, severe, dependence ([place]) 09/16/2006   05/22/2015 Argumentative, belligerent and verbally abusive to staff per his note from [facility])    Anxiety state 04/25/2018   Chronic low back pain 09/16/2006   Followed by [facility].  Per his chart from [place], he fell off two storyhouse roof while cleaning his brother's gutters and sustained compression fracture of L3, L4 and L5 and fracture of right transverse process at L3 and nondisplaced fracture of left glenoid rim many years ago. He had multiple imaging in [place] including Lumbar MRI in 2016 which showed moderate to severe spinal canal stenos   Delirium tremens ([place]) 09/01/2017   Depression    Dry eye 11/23/2016   Hiatal hernia 09/14/2016   Status Collis gastroplasty and Nissen's fundoplication on 51/70/0174 in [place]   History of delirium tremons 07/22/2020   DTs during 10/2016 08/2017. And 12/2017 admissions    History of pulmonary embolus (PE) 11/02/2016   Unprovoked 11/01/2016. Xarelto from 11/01/16 to 09/08/2017.   Hydrocele    Surgically corrected   Hypertension    Hypoalbuminemia due to protein-calorie malnutrition ([place]) 07/22/2020   Lumbar compression fracture (HCC)    Multiple rib fractures 07/22/2019   Left rib details XR: left ribs demonstrate multiple remote rib   Pancytopenia (HCC)    Rhabdomyolysis 07/22/2020   Skin  cancer    exciced 2017   Tinea versicolor 06/08/2013   Tobacco use disorder 07/22/2020   Tooth infection 04/25/2018   Trigger finger, acquired 11/18/2012   Ulnar tunnel syndrome of right wrist 11/08/2012    PAST SURGICAL HISTORY:   Past Surgical History:  Procedure Laterality Date   CATARACT EXTRACTION     HIATAL HERNIA REPAIR  2016   HYDROCELE EXCISION / REPAIR  2010   SKIN CANCER EXCISION Left    Excised 2017    SOCIAL HISTORY:   Social History    Tobacco Use   Smoking status: Never   Smokeless tobacco: Current    Types: Snuff   Tobacco comments:    Pt states do snuff when stressed had 1 can last 3 month//PL  Substance Use Topics   Alcohol use: Yes    Alcohol/week: 43.0 standard drinks of alcohol    Types: 42 Cans of beer, 1 Standard drinks or equivalent per week    Comment: daily use-6 beers daily and whiskey when he has it.    FAMILY HISTORY:   Family History  Problem Relation Age of Onset   Heart attack Father        died at 69 years   Dementia Father    Stroke Father    Cancer Sister    Colon cancer Neg Hx    Colon polyps Neg Hx    Esophageal cancer Neg Hx    Stomach cancer Neg Hx    Rectal cancer Neg Hx     DRUG ALLERGIES:   Allergies  Allergen Reactions   Other Itching and Other (See Comments)    Seasonal allergies- Itchy eyes, runny nose, congestion   Poison Ivy Extract Rash    REVIEW OF SYSTEMS:   ROS As per history of present illness. All pertinent systems were reviewed above. Constitutional, HEENT, cardiovascular, respiratory, GI, GU, musculoskeletal, neuro, psychiatric, endocrine, integumentary and hematologic systems were reviewed and are otherwise negative/unremarkable except for positive findings mentioned above in the HPI.   MEDICATIONS AT HOME:   Prior to Admission medications   Medication Sig Start Date End Date Taking? Authorizing Provider  acetaminophen (TYLENOL) 325 MG tablet Take 325-650 mg by mouth every 6 (six) hours as needed for headache (or pain). Patient not taking: Reported on 03/23/2022    [provider]  amiodarone (PACERONE) 200 MG tablet TAKE 1 TABLET (200 MG TOTAL) BY MOUTH DAILY. Patient not taking: Reported on 03/23/2022 03/19/22 03/19/23  Sharion Settler, DO  ascorbic acid (VITAMIN C) 500 MG tablet Take 500 mg by mouth daily as needed (for supplementation).    [provider]  aspirin EC 325 MG tablet Take 325 mg by mouth as needed (for  pain). Patient not taking: Reported on 03/23/2022    [provider]  B Complex-Folic Acid (B COMPLEX PLUS) TABS Take 1 tablet by mouth daily as needed (for supplementation).    [provider]  CALCIUM PO Take 1 tablet by mouth 2 (two) times a week.    [provider]  Cholecalciferol (VITAMIN D3 PO) Take 1 capsule by mouth daily as needed (for supplementation).    [provider]  ferrous sulfate 325 (65 FE) MG tablet Take 1 tablet (325 mg total) by mouth daily as needed (for supplementation). 03/19/22   Sharion Settler, DO  folic acid (FOLVITE) 1 MG tablet Take 1 tablet (1 mg total) by mouth daily. Patient not taking: Reported on 03/23/2022 03/20/22   Sharion Settler, DO  furosemide (  LASIX) 20 MG tablet TAKE 1 TABLET (20 MG TOTAL) BY MOUTH DAILY. Patient not taking: Reported on 03/23/2022 03/19/22 03/19/23  Sharion Settler, DO  loratadine (CLARITIN) 10 MG tablet TAKE 1 TABLET (10 MG TOTAL) BY MOUTH DAILY. Patient taking differently: Take 10 mg by mouth daily. 10/08/20 03/23/22  Lilland, Alana, DO  magnesium chloride (SLOW-MAG) 64 MG TBEC SR tablet Take 1 tablet (64 mg total) by mouth daily. 10/09/20   Lilland, Alana, DO  methocarbamol (ROBAXIN) 750 MG tablet Take 750 mg by mouth 4 (four) times daily. Patient not taking: Reported on 03/23/2022    [provider]  metoprolol succinate (TOPROL-XL) 25 MG 24 hr tablet Take 1/2 tablet (12.5 mg total) by mouth daily. Patient not taking: Reported on 03/23/2022 03/20/22   Sharion Settler, DO  mometasone-formoterol (DULERA) 100-5 MCG/ACT AERO INHALE 2 PUFFS INTO THE LUNGS TWO TIMES DAILY. Patient not taking: Reported on 03/23/2022 03/19/22 03/19/23  Sharion Settler, DO  Multiple Vitamins-Minerals (CENTRUM SILVER 50+MEN) TABS Take 1 tablet by mouth daily with breakfast.    [provider]  Multiple Vitamins-Minerals (ZINC PO) Take 1 tablet by mouth daily as needed (for supplementation).    [provider]  rivaroxaban (XARELTO) 20 MG TABS tablet TAKE 1 TABLET (20 MG TOTAL) BY MOUTH DAILY WITH SUPPER. Patient not taking: Reported on 03/23/2022 03/19/22 03/19/23  Sharion Settler, DO  rosuvastatin (CRESTOR) 10 MG tablet TAKE 1 TABLET (10 MG TOTAL) BY MOUTH DAILY. Patient not taking: Reported on 03/23/2022 03/19/22 03/19/23  Sharion Settler, DO  sertraline (ZOLOFT) 50 MG tablet Take 50 mg by mouth daily. Patient not taking: Reported on 03/23/2022    [provider]  simethicone (GAS-X) 80 MG chewable tablet Chew 80-160 mg by mouth every 6 (six) hours as needed for flatulence. Patient not taking: Reported on 03/23/2022    [provider]  thiamine (VITAMIN B1) 100 MG tablet Take 1 tablet (100 mg total) by mouth daily. Patient not taking: Reported on 03/23/2022 03/20/22   Sharion Settler, DO      VITAL SIGNS:  Blood pressure 129/86, pulse (!) 103, temperature 98.4 F (36.9 C), resp. rate 18, height '5\' 10"'$  (1.778 m), weight 61.2 kg, SpO2 100 %.  PHYSICAL EXAMINATION:  Physical Exam  GENERAL:  66 y.o.-year-old disheveled Caucasian patient lying in the bed with no acute distress.  EYES: Pupils equal, round, reactive to light and accommodation. No scleral icterus. Extraocular muscles intact.  HEENT: Head atraumatic, normocephalic. Oropharynx and nasopharynx clear.  NECK:  Supple, no jugular venous distention. No thyroid enlargement, no tenderness.  LUNGS: Mildly diminished bibasilar breath sounds.. No use of accessory muscles of respiration.  CARDIOVASCULAR: Regular rate and rhythm, S1, S2 normal. No murmurs, rubs, or gallops.  ABDOMEN: Soft, nondistended, nontender. Bowel sounds present. No organomegaly or mass.  EXTREMITIES: No pedal edema, cyanosis, or clubbing.  NEUROLOGIC: Cranial nerves II through XII are intact. Muscle strength 5/5 in all extremities. Sensation intact. Gait not checked.  PSYCHIATRIC: The patient is alert and oriented x 3.  Normal affect and good  eye contact. SKIN: No obvious rash, lesion, or ulcer.   LABORATORY PANEL:   CBC Recent Labs  Lab 03/23/22 1502  WBC 4.2  HGB 10.6*  HCT 33.2*  PLT 317   ------------------------------------------------------------------------------------------------------------------  Chemistries  Recent Labs  Lab 03/19/22 0324 03/23/22 1424  NA 134* 138  K 4.2 4.5  CL 108 106  CO2 21* 22  GLUCOSE 89 81  BUN 8 15  CREATININE 0.68 0.73  CALCIUM 7.4* 8.9  MG 1.4*  --   AST  --  52*  ALT  --  26  ALKPHOS  --  106  BILITOT  --  1.2   ------------------------------------------------------------------------------------------------------------------  Cardiac Enzymes No results for input(s): "TROPONINI" in the last 168 hours. ------------------------------------------------------------------------------------------------------------------  RADIOLOGY:  CT Angio Chest PE W/Cm &/Or Wo Cm  Result Date: 03/23/2022 CLINICAL DATA:  Pulmonary embolism (PE) suspected, high prob EXAM: CT ANGIOGRAPHY CHEST WITH CONTRAST TECHNIQUE: Multidetector CT imaging of the chest was performed using the standard protocol during bolus administration of intravenous contrast. Multiplanar CT image reconstructions and MIPs were obtained to evaluate the vascular anatomy. RADIATION DOSE REDUCTION: This exam was performed according to the departmental dose-optimization program which includes automated exposure control, adjustment of the mA and/or kV according to patient size and/or use of iterative reconstruction technique. CONTRAST:  137m OMNIPAQUE IOHEXOL 350 MG/ML SOLN COMPARISON:  Radiograph earlier today.  Chest CTA 1 week ago FINDINGS: Cardiovascular: There are no filling defects within the pulmonary arteries to suggest pulmonary embolus. The heart is normal in size, there are coronary artery calcifications. Aortic atherosclerosis and tortuosity, no acute aortic findings. No pericardial effusion. Mediastinum/Nodes: No  enlarged mediastinal or hilar lymph nodes, stable subcentimeter short axis anterior paratracheal node. No esophageal wall thickening. Postsurgical change in the distal esophagus. No thyroid nodule. Lungs/Pleura: Minimal biapical pleuroparenchymal scarring. Dependent atelectasis, basilar assessment obscured by breathing motion. Benign calcified granuloma in the right upper lobe, needing no further follow-up. Minimal retained mucus in the trachea. No pleural fluid. Upper Abdomen: Postsurgical change of the proximal stomach. Additional surgical clips in the left upper quadrant. No acute upper abdominal findings. Musculoskeletal: Stable appearance of multiple healing and chronic lower rib fractures. No acute osseous findings. Stable small posterior disc osteophyte complex at T6-T7. No chest wall soft tissue abnormalities Review of the MIP images confirms the above findings. IMPRESSION: 1. No pulmonary embolus or acute intrathoracic abnormality. 2. Coronary artery calcifications. Aortic Atherosclerosis (ICD10-I70.0). Electronically Signed   By: MKeith RakeM.D.   On: 03/23/2022 21:08   DG Chest 2 View  Result Date: 03/23/2022 CLINICAL DATA:  Shortness of breath EXAM: CHEST - 2 VIEW COMPARISON:  03/15/2022 FINDINGS: The heart size and mediastinal contours are within normal limits. Both lungs are clear. Disc degenerative disease of the thoracic spine. IMPRESSION: No acute abnormality of the lungs. Electronically Signed   By: ADelanna AhmadiM.D.   On: 03/23/2022 14:59      IMPRESSION AND PLAN:  Assessment and Plan: * Acute on chronic diastolic CHF (congestive heart failure) ([place] - The patient was admitted to an observation to a telemetry bed. - We will continue diuresis with IV Lasix. - We will follow serial troponins. - He had a 2D echo with grade 3 diastolic dysfunction on 88/34/1962with EF of 55 to 60% and mild to moderate mitral regurgitation. - We will follow I's and O's and daily weights. - We will  resume his beta-blocker therapy with Toprol-XL.  Acute respiratory failure with hypoxia (HCC) - O2 protocol will be followed. - This is clearly secondary to #1.  Paroxysmal atrial fibrillation (HCC) - We will continue his amiodarone and Xarelto.  Dyslipidemia - We will continue statin therapy.  Alcohol abuse - She will be monitored for alcohol withdrawal.  He stated that he has been drinking 1 beer daily.  Depression - We will continue Zoloft.   DVT prophylaxis: Xarelto. Advanced Care Planning:  Code Status: full code. Family Communication:  The plan of care was discussed in details with the patient (and family). I answered all questions. The patient agreed to proceed with the above mentioned plan. Further management will depend upon hospital course. Disposition Plan: Back to previous home environment Consults called: none. All the records are reviewed and case discussed with ED provider.  Status is: Observation   I certify that at the time of admission, it is my clinical judgment that the patient will require  hospital care extending less than 2 midnights.                            Dispo: The patient is from: Home              Anticipated d/c is to: Home              Patient currently is not medically stable to d/c.              Difficult to place patient: No  Christel Mormon M.D on 03/24/2022 at 2:11 AM  [facility]   From 7 PM-7 AM, contact night-coverage [email or web address]  CC: Primary care physician; Jacelyn Grip, MD

## 2022-03-24 ENCOUNTER — Encounter (HOSPITAL_COMMUNITY): Payer: Self-pay | Admitting: Family Medicine

## 2022-03-24 DIAGNOSIS — I5033 Acute on chronic diastolic (congestive) heart failure: Secondary | ICD-10-CM | POA: Diagnosis not present

## 2022-03-24 DIAGNOSIS — E785 Hyperlipidemia, unspecified: Secondary | ICD-10-CM

## 2022-03-24 DIAGNOSIS — F1010 Alcohol abuse, uncomplicated: Secondary | ICD-10-CM

## 2022-03-24 LAB — BASIC METABOLIC PANEL
Anion gap: 7 (ref 5–15)
BUN: 15 mg/dL (ref 8–23)
CO2: 23 mmol/L (ref 22–32)
Calcium: 8.4 mg/dL — ABNORMAL LOW (ref 8.9–10.3)
Chloride: 108 mmol/L (ref 98–111)
Creatinine, Ser: 0.78 mg/dL (ref 0.61–1.24)
GFR, Estimated: >60 mL/min (ref >60)
Glucose, Bld: 104 mg/dL — ABNORMAL HIGH (ref 70–99)
Potassium: 3.8 mmol/L (ref 3.5–5.1)
Sodium: 138 mmol/L (ref 135–145)

## 2022-03-24 LAB — CBC
HCT: 31.2 % — ABNORMAL LOW (ref 39.0–52.0)
Hemoglobin: 10.3 g/dL — ABNORMAL LOW (ref 13.0–17.0)
MCH: 35.8 pg — ABNORMAL HIGH (ref 26.0–34.0)
MCHC: 33 g/dL (ref 30.0–36.0)
MCV: 108.3 fL — ABNORMAL HIGH (ref 80.0–100.0)
Platelets: 274 10*3/uL (ref 150–400)
RBC: 2.88 MIL/uL — ABNORMAL LOW (ref 4.22–5.81)
RDW: 15.1 % (ref 11.5–15.5)
WBC: 5.6 10*3/uL (ref 4.0–10.5)
nRBC: 0 % (ref 0.0–0.2)

## 2022-03-24 NOTE — Progress Notes (Signed)
PROGRESS NOTE    Anthony [name]  TIR:[id number] DOB: 07-06-1956 DOA: 03/23/2022 PCP: Jacelyn Grip, MD    Brief Narrative:   Anthony [name] is a 66 y.o. male with past medical history significant for paroxysmal atrial fibrillation, chronic diastolic congestive heart failure, essential hypertension, alcohol use disorder, homelessness who presented to [facility] ED via EMS for shortness of breath.  Patient was at [facility] in which EMS was called due to his complaints.  Patient reports acute onset worsening of his shortness of breath with minimal exertion and productive cough of yellow sputum without wheezing.  Denies chest pain or palpitations, no urinary symptoms.  No fever/chills.  In the ED, patient was satting well on room air at rest but when he was ambulating it dropped to 85%.  Patiently patient states has been out of his medications, does not know where they are and he has tried to find them without success.  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  In the ED, temperature 98.2 F, HR 90, RR 17, BP 120/92, SPO2 100% on room air at rest, but dropped to 85% on ambulation.  WBC 4.2, hemoglobin 10.6, platelets 317.  Sodium 138, potassium 4.5, chloride 106, CO2 22, glucose 81, BUN 15, creatinine 0.73.  AST 52, ALT 26, total bilirubin 1.2.  High sensitive troponin 60>8.  BNP 583.7.  COVID-19 PCR negative.  Influenza A/B PCR negative.  Chest x-ray with no acute abnormality.  CT angiogram chest with no pulmonary embolism or acute intrathoracic abnormality, coronary artery calcifications.  Patient was given 20 mg p.o. Lasix, Xarelto, Crestor and Toprol-XL.  EDP requested admission for further evaluation and management of acute hypoxic respite failure secondary to CHF exacerbation due to medication noncompliance.  Assessment & Plan:   Acute hypoxic respiratory failure, POA Acute on chronic diastolic  congestive heart failure Patient presenting to ED with acute shortness of breath.  Patient is afebrile, imaging negative for PE, no consolidation concerning for infectious etiology.  BNP elevated on admission.  Patient has lost his medications and likely etiology is noncompliance. -- Furosemide 40 mg IV every 12 hours; likely can switch back to oral dosing tomorrow -- Metoprolol succinate 12.5 mg p.o. daily -- Strict I's and O's and daily weights -- Ambulatory O2 screen -- BMP daily  Paroxysmal atrial fibrillation EKG on admission, normal sinus rhythm. -- Amiodarone 200 mg p.o. daily -- Metoprolol succinate 12.5 mg p.o. daily -- Xarelto 20 mg p.o. daily -- Monitor on telemetry  Essential hypertension -- Metoprolol succinate 12.5 mg p.o. daily  EtOH use disorder Counseled on need for complete cessation.  Social work for substance abuse resources.  Depression/anxiety: Zoloft 50 mg p.o. daily  Hyperlipidemia: Crestor 10 mg p.o. daily  Homelessness Social work for evaluation.  Anticipate discharge back to shelter once medically stable.  Weakness/deconditioning/gait disturbance: --PT/OT evaluation   DVT prophylaxis:  rivaroxaban (XARELTO) tablet 20 mg    Code Status: Full Code Family Communication: No family present at bedside this morning  Disposition Plan:  Level of care: Telemetry Status is: Observation The patient remains OBS appropriate and will d/c before 2 midnights.  Anticipate discharge back to homeless shelter on 9/6    Consultants:  None  Procedures:  None  Antimicrobials:  None   Subjective: Patient seen and examined at bedside, resting comfortably.  Disheveled, chronically ill in appearance.  States does not know where his medications went,  cannot recall where he left him and has been trying to find them apparently.  States he goes into [facility] and they allow him to sit in there for some time.  Discussed with patient that his recurrent admission likely  secondary to noncompliance as he is lost his medications.  He is a patient of the family medicine residency program at [facility], recently discharged on 8/31; seems to have significant compliance issues with his medical treatment as well as continued substance abuse.  Discussed with patient need for complete cessation from alcohol and needs to have more responsibility and maintaining his health and managing his medications.  No other questions or concerns at this time.  Denies headache, no dizziness, no chest pain, no palpitations, no current shortness of breath, no fever/chills/night sweats, no nausea/vomiting/diarrhea, no fatigue, no focal weakness, no paresthesias.  No acute events overnight per nursing staff.  Objective: Vitals:   03/23/22 2115 03/24/22 0232 03/24/22 0541 03/24/22 1037  BP: 129/86 96/66 112/86 102/71  Pulse: (!) 103 90 86 89  Resp: '18 17 15 16  '$ Temp: 98.4 F (36.9 C) 98.1 F (36.7 C) 98.1 F (36.7 C) 98.4 F (36.9 C)  TempSrc:  Oral  Oral  SpO2: 100% 92% 100% 98%  Weight:      Height:        Intake/Output Summary (Last 24 hours) at 03/24/2022 1142 Last data filed at 03/24/2022 0900 Gross per 24 hour  Intake 630 ml  Output 1875 ml  Net -1245 ml   Filed Weights   03/23/22 1339  Weight: 61.2 kg    Examination:  Physical Exam: GEN: NAD, alert and oriented x4, chronically ill/disheveled in appearance, appears older than stated age HEENT: NCAT, PERRL, EOMI, sclera clear, dentition PULM: CTAB w/o wheezes/crackles, normal respiratory effort, on room air CV: RRR w/o M/G/R GI: abd soft, NTND, NABS, no R/G/M MSK: no peripheral edema, moves all extremities independently NEURO: CN II-XII intact, no focal deficits, sensation to light touch intact PSYCH: Depressed mood, flat affect    Data Reviewed: I have personally reviewed following labs and imaging studies  CBC: Recent Labs  Lab 03/19/22 0324 03/23/22 1502 03/24/22 0339  WBC 4.2 4.2 5.6  NEUTROABS  --  2.1   --   HGB 9.3* 10.6* 10.3*  HCT 28.1* 33.2* 31.2*  MCV 108.1* 109.9* 108.3*  PLT 219 317 675   Basic Metabolic Panel: Recent Labs  Lab 03/18/22 0652 03/19/22 0324 03/23/22 1424 03/24/22 0339  NA 137 134* 138 138  K 4.3 4.2 4.5 3.8  CL 111 108 106 108  CO2 21* 21* 22 23  GLUCOSE 89 89 81 104*  BUN '8 8 15 15  '$ CREATININE 0.68 0.68 0.73 0.78  CALCIUM 7.1* 7.4* 8.9 8.4*  MG 1.3* 1.4*  --   --    GFR: Estimated Creatinine Clearance: 78.6 mL/min (by C-G formula based on SCr of 0.78 mg/dL). Liver Function Tests: Recent Labs  Lab 03/23/22 1424  AST 52*  ALT 26  ALKPHOS 106  BILITOT 1.2  PROT 7.3  ALBUMIN 2.9*   No results for input(s): "LIPASE", "AMYLASE" in the last 168 hours. No results for input(s): "AMMONIA" in the last 168 hours. Coagulation Profile: No results for input(s): "INR", "PROTIME" in the last 168 hours. Cardiac Enzymes: No results for input(s): "CKTOTAL", "CKMB", "CKMBINDEX", "TROPONINI" in the last 168 hours. BNP (last 3 results) No results for input(s): "PROBNP" in the last 8760 hours. HbA1C: No results for input(s): "HGBA1C" in the last  72 hours. CBG: No results for input(s): "GLUCAP" in the last 168 hours. Lipid Profile: No results for input(s): "CHOL", "HDL", "LDLCALC", "TRIG", "CHOLHDL", "LDLDIRECT" in the last 72 hours. Thyroid Function Tests: No results for input(s): "TSH", "T4TOTAL", "FREET4", "T3FREE", "THYROIDAB" in the last 72 hours. Anemia Panel: No results for input(s): "VITAMINB12", "FOLATE", "FERRITIN", "TIBC", "IRON", "RETICCTPCT" in the last 72 hours. Sepsis Labs: No results for input(s): "PROCALCITON", "LATICACIDVEN" in the last 168 hours.  Recent Results (from the past 240 hour(s))  Culture, blood (routine x 2)     Status: None   Collection Time: 03/15/22  4:03 PM   Specimen: BLOOD  Result Value Ref Range Status   Specimen Description BLOOD RIGHT ANTECUBITAL  Final   Special Requests   Final    BOTTLES DRAWN AEROBIC AND  ANAEROBIC Blood Culture adequate volume   Culture   Final    NO GROWTH 5 DAYS Performed at [facility], [address], [place], [place] [address]    Report Status 03/20/2022 FINAL  Final  Culture, blood (routine x 2)     Status: None   Collection Time: 03/15/22  4:08 PM   Specimen: BLOOD  Result Value Ref Range Status   Specimen Description BLOOD LEFT ANTECUBITAL  Final   Special Requests   Final    BOTTLES DRAWN AEROBIC AND ANAEROBIC Blood Culture adequate volume   Culture   Final    NO GROWTH 5 DAYS Performed at [facility], [place] [address], [place], [place] [address]    Report Status 03/20/2022 FINAL  Final  Resp Panel by RT-PCR (Flu A&B, Covid) Anterior Nasal Swab     Status: None   Collection Time: 03/23/22  5:41 PM   Specimen: Anterior Nasal Swab  Result Value Ref Range Status   SARS Coronavirus 2 by RT PCR NEGATIVE NEGATIVE Final    Comment: (NOTE) SARS-CoV-2 target nucleic acids are NOT DETECTED.  The SARS-CoV-2 RNA is generally detectable in upper respiratory specimens during the acute phase of infection. The lowest concentration of SARS-CoV-2 viral copies this assay can detect is 138 copies/mL. A negative result does not preclude SARS-Cov-2 infection and should not be used as the sole basis for treatment or other patient management decisions. A negative result may occur with  improper specimen collection/handling, submission of specimen other than nasopharyngeal swab, presence of viral mutation(s) within the areas targeted by this assay, and inadequate number of viral copies(<138 copies/mL). A negative result must be combined with clinical observations, patient history, and epidemiological information. The expected result is Negative.  Fact Sheet for Patients:  EntrepreneurPulse.com.au  Fact Sheet for Healthcare Providers:  IncredibleEmployment.be  This test is no t yet approved or cleared by the [place] FDA  and  has been authorized for detection and/or diagnosis of SARS-CoV-2 by FDA under an Emergency Use Authorization (EUA). This EUA will remain  in effect (meaning this test can be used) for the duration of the COVID-19 declaration under Section 564(b)(1) of the Act, 21 U.S.C.section 360bbb-3(b)(1), unless the authorization is terminated  or revoked sooner.       Influenza A by PCR NEGATIVE NEGATIVE Final   Influenza B by PCR NEGATIVE NEGATIVE Final    Comment: (NOTE) The Xpert Xpress SARS-CoV-2/FLU/RSV plus assay is intended as an aid in the diagnosis of influenza from Nasopharyngeal swab specimens and should not be used as a sole basis for treatment. Nasal washings and aspirates are unacceptable for Xpert Xpress SARS-CoV-2/FLU/RSV testing.  Fact  Sheet for Patients: EntrepreneurPulse.com.au  Fact Sheet for Healthcare Providers: IncredibleEmployment.be  This test is not yet approved or cleared by the [place] FDA and has been authorized for detection and/or diagnosis of SARS-CoV-2 by FDA under an Emergency Use Authorization (EUA). This EUA will remain in effect (meaning this test can be used) for the duration of the COVID-19 declaration under Section 564(b)(1) of the Act, 21 U.S.C. section 360bbb-3(b)(1), unless the authorization is terminated or revoked.  Performed at [facility], [place] [address] [place],  [address]          Radiology Studies: CT Angio Chest PE W/Cm &/Or Wo Cm  Result Date: 03/23/2022 CLINICAL DATA:  Pulmonary embolism (PE) suspected, high prob EXAM: CT ANGIOGRAPHY CHEST WITH CONTRAST TECHNIQUE: Multidetector CT imaging of the chest was performed using the standard protocol during bolus administration of intravenous contrast. Multiplanar CT image reconstructions and MIPs were obtained to evaluate the vascular anatomy. RADIATION DOSE REDUCTION: This exam was performed according to the  departmental dose-optimization program which includes automated exposure control, adjustment of the mA and/or kV according to patient size and/or use of iterative reconstruction technique. CONTRAST:  157m OMNIPAQUE IOHEXOL 350 MG/ML SOLN COMPARISON:  Radiograph earlier today.  Chest CTA 1 week ago FINDINGS: Cardiovascular: There are no filling defects within the pulmonary arteries to suggest pulmonary embolus. The heart is normal in size, there are coronary artery calcifications. Aortic atherosclerosis and tortuosity, no acute aortic findings. No pericardial effusion. Mediastinum/Nodes: No enlarged mediastinal or hilar lymph nodes, stable subcentimeter short axis anterior paratracheal node. No esophageal wall thickening. Postsurgical change in the distal esophagus. No thyroid nodule. Lungs/Pleura: Minimal biapical pleuroparenchymal scarring. Dependent atelectasis, basilar assessment obscured by breathing motion. Benign calcified granuloma in the right upper lobe, needing no further follow-up. Minimal retained mucus in the trachea. No pleural fluid. Upper Abdomen: Postsurgical change of the proximal stomach. Additional surgical clips in the left upper quadrant. No acute upper abdominal findings. Musculoskeletal: Stable appearance of multiple healing and chronic lower rib fractures. No acute osseous findings. Stable small posterior disc osteophyte complex at T6-T7. No chest wall soft tissue abnormalities Review of the MIP images confirms the above findings. IMPRESSION: 1. No pulmonary embolus or acute intrathoracic abnormality. 2. Coronary artery calcifications. Aortic Atherosclerosis (ICD10-I70.0). Electronically Signed   By: MKeith RakeM.D.   On: 03/23/2022 21:08   DG Chest 2 View  Result Date: 03/23/2022 CLINICAL DATA:  Shortness of breath EXAM: CHEST - 2 VIEW COMPARISON:  03/15/2022 FINDINGS: The heart size and mediastinal contours are within normal limits. Both lungs are clear. Disc degenerative disease  of the thoracic spine. IMPRESSION: No acute abnormality of the lungs. Electronically Signed   By: ADelanna AhmadiM.D.   On: 03/23/2022 14:59        Scheduled Meds:  amiodarone  200 mg Oral Daily   [START ON 03/26/2022] calcium carbonate  1,250 mg Oral Once per day on Mon Thu   cholecalciferol  1,000 Units Oral Daily   folic acid  1 mg Oral Daily   furosemide  40 mg Intravenous Q12H   loratadine  10 mg Oral Daily   magnesium chloride  1 tablet Oral Daily   methocarbamol  750 mg Oral QID   metoprolol succinate  12.5 mg Oral Daily   multivitamin with minerals  1 tablet Oral Q breakfast   rivaroxaban  20 mg Oral Q supper   rosuvastatin  10 mg Oral Daily   sertraline  50 mg Oral  Daily   thiamine  100 mg Oral Daily   Continuous Infusions:   LOS: 0 days    Time spent: 52 minutes spent on chart review, discussion with nursing staff, consultants, updating family and interview/physical exam; more than 50% of that time was spent in counseling and/or coordination of care.    Jack Mineau J [place] DO [facility] Available via Epic secure chat 7am-7pm After these hours, please refer to coverage provider listed on [email or web address] 03/24/2022, 11:42 AM

## 2022-03-24 NOTE — Assessment & Plan Note (Signed)
-   She will be monitored for alcohol withdrawal.  He stated that he has been drinking 1 beer daily.

## 2022-03-24 NOTE — Assessment & Plan Note (Addendum)
-   The patient was admitted to an observation to a telemetry bed. - We will continue diuresis with IV Lasix. - We will follow serial troponins. - He had a 2D echo with grade 3 diastolic dysfunction on 03/19/7459 with EF of 55 to 60% and mild to moderate mitral regurgitation. - We will follow I's and O's and daily weights. - We will resume his beta-blocker therapy with Toprol-XL.

## 2022-03-24 NOTE — Evaluation (Addendum)
Physical Therapy Evaluation-1x Patient Details Name: Anthony [name] MRN: [id number] DOB: Jan 18, 1956 Today's Date: 03/24/2022  History of Present Illness  66 yo male admitted with acute on chronic CHF, acute respiratory failure. hx of chronic back pain, ETOH abuse, Afib  Clinical Impression  On eval, pt was Mod Ind with mobility. He walked ~175 feet around the unit without a device. O2 >90% on RA and HR 99 bpm end of session. Pt appeared to tolerate activity well. He expresses concerns about being able to tolerate increased activity (longer distances while managing his belongings in community-reports he had difficulty walking a whole block prior to this admission). Currently, pt does not have any acute PT needs. He could benefit from increased activity with nursing staff or mobility team with continued assessment of his vitals. 1x eval.     Recommendations for follow up therapy are one component of a multi-disciplinary discharge planning process, led by the attending physician.  Recommendations may be updated based on patient status, additional functional criteria and insurance authorization.  Follow Up Recommendations No PT follow up Can patient physically be transported by private vehicle: Yes    Assistance Recommended at Discharge PRN  Patient can return home with the following  Help with stairs or ramp for entrance;Assist for transportation;Direct supervision/assist for medications management;Direct supervision/assist for financial management;Assistance with cooking/housework    Equipment Recommendations None recommended by PT  Recommendations for Other Services       Functional Status Assessment Patient has had a recent decline in their functional status and demonstrates the ability to make significant improvements in function in a reasonable and predictable amount of time.     Precautions / Restrictions Precautions Precautions: Fall Restrictions Weight Bearing Restrictions: No       Mobility  Bed Mobility Overal bed mobility: Independent                  Transfers Overall transfer level: Modified independent                      Ambulation/Gait Ambulation/Gait assistance: Modified independent (Device/Increase time) Gait Distance (Feet): 175 Feet Assistive device: None Gait Pattern/deviations: Step-through pattern, Decreased stride length       General Gait Details: Mod Ind with ambulation. Slow but steady. O2 >90% on RA, HR 99 bpm end of session. Pt able to converse and ambulate without significant dyspnea. He does express concerns and prior issues with endurance  Stairs            Wheelchair Mobility    Modified Rankin (Stroke Patients Only)       Balance Overall balance assessment: History of Falls         Standing balance support: No upper extremity supported, During functional activity Standing balance-Leahy Scale: Good                               Pertinent Vitals/Pain Pain Assessment Pain Assessment: No/denies pain    Home Living Family/patient expects to be discharged to:: Unsure                   Additional Comments: was staying at [facility] (homeless shelter) stays at [employer] that they pay the rent for (not the nicest places per pt description but somewhere to stay). pt states he cannot go back after discharge.    Prior Function Prior Level of Function : Independent/Modified Independent  Mobility Comments: pt ambulates with no device, uses carts as able, does not drive. ADLs Comments: independent, carries backpack with belongings     Hand Dominance        Extremity/Trunk Assessment   Upper Extremity Assessment Upper Extremity Assessment: Overall WFL for tasks assessed    Lower Extremity Assessment Lower Extremity Assessment: Generalized weakness    Cervical / Trunk Assessment Cervical / Trunk Assessment: Kyphotic  Communication   Communication: No difficulties   Cognition Arousal/Alertness: Awake/alert Behavior During Therapy: WFL for tasks assessed/performed Overall Cognitive Status: Within Functional Limits for tasks assessed                                          General Comments      Exercises     Assessment/Plan    PT Assessment Patient does not need any further PT services  PT Problem List Decreased activity tolerance       PT Treatment Interventions      PT Goals (Current goals can be found in the Care Plan section)  Acute Rehab PT Goals Patient Stated Goal: to find somewhere to stay/live; to be able to better care for himself PT Goal Formulation: With patient Time For Goal Achievement: 04/07/22 Potential to Achieve Goals: Good    Frequency       Co-evaluation               AM-PAC PT "6 Clicks" Mobility  Outcome Measure Help needed turning from your back to your side while in a flat bed without using bedrails?: None Help needed moving from lying on your back to sitting on the side of a flat bed without using bedrails?: None Help needed moving to and from a bed to a chair (including a wheelchair)?: None Help needed standing up from a chair using your arms (e.g., wheelchair or bedside chair)?: None Help needed to walk in hospital room?: None Help needed climbing 3-5 steps with a railing? : A [place] 6 Click Score: 23    End of Session   Activity Tolerance: Patient tolerated treatment well Patient left: in bed;with call bell/phone within reach;with nursing/sitter in room   PT Visit Diagnosis: Difficulty in walking, not elsewhere classified (R26.2)    Time: 1120-1140 PT Time Calculation (min) (ACUTE ONLY): 20 min   Charges:   PT Evaluation $PT Eval Low Complexity: 1 Low             Doreatha Massed, PT Acute Rehabilitation  Office: [phone] Pager: [phone]

## 2022-03-24 NOTE — Assessment & Plan Note (Signed)
-   We will continue his amiodarone and Xarelto.

## 2022-03-24 NOTE — Progress Notes (Addendum)
Mobility Specialist - Progress Note   03/24/22 1556  Mobility  HOB Elevated/Bed Position Self regulated  Activity Ambulated independently in hallway  Range of Motion/Exercises Active  Level of Assistance Independent  Assistive Device None  Distance Ambulated (ft) 350 ft  Activity Response Tolerated well  Transport method Ambulatory  $Mobility charge 1 Mobility   Pt received in bed and agreeable to mobility.  Pt to bed after session with all needs met.      Pre-mobility:  95bpm HR, 94% SpO2 During mobility: 101bpm HR, 92% SpO2 Post-mobility:  82bpm HR, 99% SPO2  [employer] Mobility Specialist  [employer] Mobility Specialist

## 2022-03-24 NOTE — TOC Initial Note (Signed)
Transition of Care [facility]) - Initial/Assessment Note    Patient Details  Name: Anthony [name] MRN: [id number] Date of Birth: 1956-06-11  Transition of Care [facility]) CM/SW Contact:    Leeroy Cha, RN Phone Number: 03/24/2022, 8:49 AM  Clinical Narrative:                 [id number] [facility]. Shelter resources added to the AVS.  Expected Discharge Plan: Homeless Shelter Barriers to Discharge: Continued Medical Work up   Patient Goals and CMS Choice Patient states their goals for this hospitalization and ongoing recovery are:: ti go baCK TO [place] CMS Medicare.gov Compare Post Acute Care list provided to:: Patient Choice offered to / list presented to : Patient  Expected Discharge Plan and Services Expected Discharge Plan: [place]   Discharge Planning Services: CM Consult   Living arrangements for the past 2 months: [place]: [place] Date [place]: 03/24/22 Time HH Agency Contacted: [id number] Representative spoke with at [place]: [place]  Prior Living Arrangements/Services Living arrangements for the past 2 months: [place] with:: Self Patient language and need for interpreter reviewed:: Yes Do you feel safe going back to the place where you live?: Yes            Criminal Activity/Legal Involvement Pertinent to Current Situation/Hospitalization: No - Comment as needed  Activities of Daily Living Home Assistive Devices/Equipment: None ADL Screening (condition at time of admission) Patient's cognitive ability adequate to safely complete daily activities?: Yes Is the patient deaf or have difficulty hearing?: Yes Does the patient have difficulty seeing, even when wearing glasses/contacts?: Yes Does the patient have difficulty concentrating, remembering, or making decisions?: No Patient able to express need for assistance with ADLs?: Yes Does  the patient have difficulty dressing or bathing?: No Independently performs ADLs?: Yes (appropriate for developmental age) Does the patient have difficulty walking or climbing stairs?: No Weakness of Legs: None Weakness of Arms/Hands: None  Permission Sought/Granted                  Emotional Assessment Appearance:: Appears stated age Attitude/Demeanor/Rapport: Guarded Affect (typically observed): Accepting Orientation: : Oriented to Self, Oriented to Place, Oriented to  Time, Oriented to Situation Alcohol / Substance Use: Illicit Drugs, Alcohol Use, Tobacco Use Psych Involvement: No (comment)  Admission diagnosis:  Dyspnea on exertion [R06.09] Hypoxia [R09.02] Acute respiratory failure with hypoxia (HCC) [J96.01] Patient Active Problem List   Diagnosis Date Noted   Alcohol abuse 03/24/2022   Dyslipidemia 03/24/2022   Acute on chronic diastolic CHF (congestive heart failure) ([place]) 03/24/2022   Acute respiratory failure with hypoxia (HCC) 03/23/2022   Generalized weakness    Atrial fibrillation with RVR ([place]) 03/15/2022   Hypokalemia 03/15/2022   Iron deficiency anemia 10/16/2020   Cough    Hypomagnesemia    Protein-calorie malnutrition, severe 08/22/2020   Alcohol use disorder, severe, in controlled environment ([place])    Pressure injury of skin 08/18/2020   Urinary tract infection without hematuria    Severe sepsis ([place]) 08/14/2020   Paroxysmal atrial fibrillation ([place]) 08/13/2020   Rhabdomyolysis 07/22/2020   Cerebral ventriculomegaly 07/22/2020   Hemoglobin decreased 07/22/2020   History of delirium tremons 07/22/2020   Hypoalbuminemia due to protein-calorie malnutrition ([place]) 07/22/2020   Tobacco use disorder  07/22/2020   Alcohol withdrawal delirium ([place])    Dehydration    Alcohol abuse with alcohol-induced mood disorder ([place]) 07/18/2018   Depression 05/26/2018   Anxiety state 04/25/2018   Need for immunization against influenza 04/25/2018   Alcohol withdrawal  ([place]) 09/01/2017   DOE (dyspnea on exertion) 11/27/2016   Actinic keratosis 11/27/2016   Macrocytic anemia 11/23/2016   History of pulmonary embolus (PE) 11/02/2016   Hiatal hernia 09/14/2016   Skin cancer 08/13/2016   Poor social situation 06/09/2013   Tinea versicolor 06/08/2013   Seborrheic dermatitis of scalp 11/08/2012   HTN (hypertension) 04/11/2012   Alcohol use disorder, severe, dependence ([place]) 09/16/2006   PCP:  Jacelyn Grip, MD Pharmacy:   [facility] [place], [place] [address] [place] [address] Phone: [phone] Fax: [phone]     Social Determinants of Health (SDOH) Interventions    Readmission Risk Interventions   Row Labels 10/08/2020    2:48 PM  Readmission Risk Prevention Plan   Section Header. No data exists in this row.   Transportation Screening   Complete  PCP or Specialist Appt within 3-5 Days   Complete  HRI or [place]   Complete  Social Work Consult for [place] Planning/Counseling   Complete  Palliative Care Screening   Complete  Medication Review Press photographer)   Complete

## 2022-03-24 NOTE — Assessment & Plan Note (Signed)
-   We will continue statin therapy. 

## 2022-03-24 NOTE — Assessment & Plan Note (Addendum)
-   O2 protocol will be followed. - This is clearly secondary to #1. 

## 2022-03-24 NOTE — TOC Progression Note (Signed)
Transition of Care [facility]) - Progression Note    Patient Details  Name: Anthony [name] MRN: [id number] Date of Birth: 01-23-1956  Transition of Care [facility]) CM/SW Contact  Leeroy Cha, RN Phone Number: 03/24/2022, 1:45 PM  Clinical Narrative:    Smoking cessation information added to AVS.   Expected Discharge Plan: Homeless Shelter Barriers to Discharge: Homeless with medical needs, Inadequate or no insurance, No Home Care Agency will accept this patient  Expected Discharge Plan and Services Expected Discharge Plan: Homeless Shelter   Discharge Planning Services: CM Consult   Living arrangements for the past 2 months: Homeless Shelter                             [place]: [place] Date [place]: 03/24/22 Time [place]: [id number] Representative spoke with at [place]: [place] ([place]) Interventions    Readmission Risk Interventions   Row Labels 10/08/2020    2:48 PM  Readmission Risk Prevention Plan   Section Header. No data exists in this row.   Transportation Screening   Complete  PCP or Specialist Appt within 3-5 Days   Complete  HRI or [place]   Complete  Social Work Consult for [place] Planning/Counseling   Complete  Palliative Care Screening   Complete  Medication Review Press photographer)   Complete

## 2022-03-24 NOTE — TOC Progression Note (Addendum)
Transition of Care [facility]) - Progression Note    Patient Details  Name: AXEL FRISK MRN: [id number] Date of Birth: April 01, 1956  Transition of Care [facility]) CM/SW Contact  Leeroy Cha, RN Phone Number: 03/24/2022, 10:58 AM  Clinical Narrative:    Have contacted 5 different hhc provider none take the insurance and can not see patient without a residence. Service was for home heart failure screening. [facility], [facility], [facility] ,[facility] and [place].  Expected Discharge Plan: Homeless Shelter Barriers to Discharge: Continued Medical Work up  Expected Discharge Plan and Services Expected Discharge Plan: Homeless Shelter   Discharge Planning Services: CM Consult   Living arrangements for the past 2 months: Homeless Shelter                             [place]: [place] Date [place]: 03/24/22 Time [place]: [phone] Representative spoke with at [place]: [place] Determinants of Health ([place]) Interventions    Readmission Risk Interventions   Row Labels 10/08/2020    2:48 PM  Readmission Risk Prevention Plan   Section Header. No data exists in this row.   Transportation Screening   Complete  PCP or Specialist Appt within 3-5 Days   Complete  HRI or [place]   Complete  Social Work Consult for [place] Planning/Counseling   Complete  Palliative Care Screening   Complete  Medication Review Press photographer)   Complete

## 2022-03-24 NOTE — Assessment & Plan Note (Signed)
-   We will continue Zoloft 

## 2022-03-25 ENCOUNTER — Other Ambulatory Visit (HOSPITAL_COMMUNITY): Payer: Self-pay

## 2022-03-25 DIAGNOSIS — I5033 Acute on chronic diastolic (congestive) heart failure: Secondary | ICD-10-CM | POA: Diagnosis not present

## 2022-03-25 LAB — BASIC METABOLIC PANEL
Anion gap: 7 (ref 5–15)
BUN: 16 mg/dL (ref 8–23)
CO2: 26 mmol/L (ref 22–32)
Calcium: 8.2 mg/dL — ABNORMAL LOW (ref 8.9–10.3)
Chloride: 103 mmol/L (ref 98–111)
Creatinine, Ser: 0.93 mg/dL (ref 0.61–1.24)
GFR, Estimated: >60 mL/min (ref >60)
Glucose, Bld: 101 mg/dL — ABNORMAL HIGH (ref 70–99)
Potassium: 3.5 mmol/L (ref 3.5–5.1)
Sodium: 136 mmol/L (ref 135–145)

## 2022-03-25 MED ORDER — METOPROLOL SUCCINATE ER 25 MG PO TB24
12.5000 mg | ORAL_TABLET | Freq: Every day | ORAL | 0 refills | Status: DC
  Filled 2022-03-25: qty 30, 60d supply, fill #0

## 2022-03-25 MED ORDER — FUROSEMIDE 20 MG PO TABS: 20.0000 mg | ORAL_TABLET | Freq: Every day | ORAL | Status: DC

## 2022-03-25 MED ORDER — RIVAROXABAN 20 MG PO TABS
ORAL_TABLET | Freq: Every day | ORAL | 0 refills | Status: DC
Start: 2022-03-25 — End: 2022-06-02
  Filled 2022-03-25: qty 30, fill #0

## 2022-03-25 MED ORDER — AMIODARONE HCL 200 MG PO TABS
ORAL_TABLET | Freq: Every day | ORAL | 0 refills | Status: DC
  Filled 2022-03-25: qty 30, fill #0

## 2022-03-25 MED ORDER — FUROSEMIDE 20 MG PO TABS
ORAL_TABLET | Freq: Every day | ORAL | 0 refills | Status: DC
Start: 2022-03-25 — End: 2022-06-02
  Filled 2022-03-25: qty 30, fill #0

## 2022-03-25 NOTE — TOC Transition Note (Signed)
Transition of Care [facility]) - CM/SW Discharge Note   Patient Details  Name: Anthony [name] MRN: [id number] Date of Birth: 17-Aug-1955  Transition [facility] Care [facility]) CM/SW Contact:  Roseanne Kaufman, RN Phone Number: 03/25/2022, 10:06 AM   Clinical Narrative:   Patient has a bus pass and can be discharged to [facility], needs to discharge before 1pm. Patient reports he prefers to stay on the street vs. Staying with family. Shelter resources have been attached to discharge summary (AVS). Patient requesting ALF list, TOC unable to provide ALF list.  No additional TOC needs.    Barriers to Discharge: No Home Care Agency will accept this patient   Patient Goals and CMS Choice Patient states their goals for this hospitalization and ongoing recovery are:: return to shelter CMS Medicare.gov Compare Post Acute Care list provided to:: Patient Choice offered to / list presented to : Patient  Discharge Placement     Return to shelter             Discharge Plan and Services   Discharge Planning Services: CM Consult                        [facility] Agency: [place] Date [place]: 03/24/22 Time [place]: [phone] Representative spoke with at [place]: [place] ([place]) Interventions     Readmission Risk Interventions    10/08/2020    2:48 PM  Readmission Risk Prevention Plan  Transportation Screening Complete  PCP or Specialist Appt within 3-5 Days Complete  HRI or [place] Complete  Social Work Consult for [place] Planning/Counseling Complete  Palliative Care Screening Complete  Medication Review Press photographer) Complete

## 2022-03-25 NOTE — Plan of Care (Signed)
  Problem: Education: Goal: Knowledge of General Education information will improve Description Including pain rating scale, medication(s)/side effects and non-pharmacologic comfort measures Outcome: Progressing   Problem: Health Behavior/Discharge Planning: Goal: Ability to manage health-related needs will improve Outcome: Progressing   

## 2022-03-25 NOTE — Evaluation (Signed)
Occupational Therapy Evaluation Patient Details Name: Anthony [name] MRN: [id number] DOB: 1955-12-24 Today's Date: 03/25/2022   History of Present Illness 66 yo male admitted with acute on chronic CHF, acute respiratory failure. hx of chronic back pain, ETOH abuse, Afib   Clinical Impression   Patient evaluated by Occupational Therapy after attempting a screen based on physical therapy's evaluation, however pt insisted on a full OT Evaluation.   No further acute OT needs identified. Unfortunately, pt is not receptive to teachings including energy conservation and self care, and rather continues to verbalize a series of complaints re: the food at the hospital and the perfect hospital conditions not reflecting pt's actual baseline.  Pt did agree that his extremity edema has almost completely resolved and that he does feel overall improved but pt expressed that when he leaves the hospital, he will likely have to return due to continued dyspnea.  Pt did tire easily with activity and was reminded of pacing and resting as needed.  A Rollator has been recommended to allow pt to sit when needed in the community. Pt required no external assist for his functional mobility and ADLs this session. All education has been completed.  See below for any follow-up Occupational Therapy or equipment needs. OT is signing off. Thank you for this referral.       Recommendations for follow up therapy are one component of a multi-disciplinary discharge planning process, led by the attending physician.  Recommendations may be updated based on patient status, additional functional criteria and insurance authorization.   Follow Up Recommendations  No OT follow up    Assistance Recommended at Discharge Intermittent Supervision/Assistance  Patient can return home with the following Assist for transportation;Assistance with cooking/housework    Functional Status Assessment  Patient has had a recent decline in their functional  status and/or demonstrates limited ability to make significant improvements in function in a reasonable and predictable amount of time  Equipment Recommendations   (Rollator)    Recommendations for Other Services       Precautions / Restrictions Restrictions Weight Bearing Restrictions: No      Mobility Bed Mobility Overal bed mobility: Independent                  Transfers Overall transfer level: Modified independent (slow)                        Balance Overall balance assessment: History of Falls, Modified Independent Sitting-balance support: No upper extremity supported, Feet supported Sitting balance-Leahy Scale: Good     Standing balance support: No upper extremity supported, During functional activity Standing balance-Leahy Scale: Good                             ADL either performed or assessed with clinical judgement   ADL Overall ADL's : At baseline                                       General ADL Comments: Pt able to demonstrate bed mobility, transfers, in-room ambulation without AD, standing at sink for hand hygiene and LE dressing without need of external assistance or cues for safety. Pt was reminded of energy conservation principles with main points reviewed.     Vision Baseline Vision/History: 1 Wears glasses Vision Assessment?: No apparent visual deficits Additional Comments:  Needs his glasses to read. Pt was assisted with finding glasses which were hanging on the neck of his gown.     Perception     Praxis      Pertinent Vitals/Pain Pain Assessment Pain Assessment: Faces Pain Score: 0-No pain Faces Pain Scale: No hurt     Hand Dominance Right   Extremity/Trunk Assessment Upper Extremity Assessment Upper Extremity Assessment: Overall WFL for tasks assessed       Cervical / Trunk Assessment Cervical / Trunk Assessment: Kyphotic   Communication Communication Communication: No  difficulties   Cognition Arousal/Alertness: Awake/alert Behavior During Therapy: WFL for tasks assessed/performed Overall Cognitive Status: No family/caregiver present to determine baseline cognitive functioning                                 General Comments: Lacks insight into medical condition and how to manage it. Needs cues to redirect to task. Pt with list of complaints but tends to shoot down solutions.     General Comments       Exercises     Shoulder Instructions      Home Living Family/patient expects to be discharged to:: Unsure                                 Additional Comments: [facility] stays at [place] [facility] that they pay the rent for (not the nicest places per pt description but somewhere to stay). pt states he cannot go back after discharge.      Prior Functioning/Environment Prior Level of Function : Independent/Modified Independent             Mobility Comments: pt ambulates with no device, uses carts as able, does not drive. ADLs Comments: independent, carries backpack with belongings        OT Problem List: Decreased activity tolerance      OT Treatment/Interventions:      OT Goals(Current goals can be found in the care plan section) Acute Rehab OT Goals Patient Stated Goal: To speak with Social Work about discharge. OT Goal Formulation: All assessment and education complete, DC therapy  OT Frequency:      Co-evaluation              AM-PAC OT "6 Clicks" Daily Activity     Outcome Measure Help from another person eating meals?: None Help from another person taking care of personal grooming?: None Help from another person toileting, which includes using toliet, bedpan, or urinal?: None Help from another person bathing (including washing, rinsing, drying)?: None Help from another person to put on and taking off regular upper body clothing?: None Help from another person to put on and  taking off regular lower body clothing?: None 6 Click Score: 24   End of Session Equipment Utilized During Treatment:  (none) Nurse Communication: Other (comment) (Met with MD re: discharge planning)  Activity Tolerance: Patient limited by fatigue Patient left: in chair;with call bell/phone within reach  OT Visit Diagnosis: History of falling (Z91.81)                Time: 3546-5681 OT Time Calculation (min): 30 min Charges:  OT General Charges $OT Visit: 1 Visit OT Evaluation $OT Eval Low Complexity: 1 Low  Aidynn Polendo, OT Acute Rehab Services Office: [phone] 03/25/2022  Julien Girt 03/25/2022, 9:04 AM

## 2022-03-25 NOTE — Discharge Summary (Signed)
Physician Discharge Summary  Anthony [name] TGG:[id number] DOB: [birth year] DOA: 03/23/2022  PCP: Jacelyn Grip, MD  Admit date: 03/23/2022 Discharge date: 03/25/2022  Admitted From: home Discharge disposition: home   Recommendations for Outpatient Follow-Up:   Needs compliance with meds/substance abuse cessation Suspect will bounce back as he is homeless and enjoys being in the hospital where he is cared for   Discharge Diagnosis:   Principal Problem:   Acute on chronic diastolic CHF (congestive heart failure) ([place]) Active Problems:   Acute respiratory failure with hypoxia (HCC)   Paroxysmal atrial fibrillation ([place])   Alcohol abuse   Dyslipidemia   Depression    Discharge Condition: Improved.  Diet recommendation: Low sodium, heart healthy.  Wound care: None.  Code status: Full.   History of Present Illness:   Anthony [name] is a 66 y.o. Caucasian male with medical history significant for alcohol use, atrial fibrillation, hypertension and grade 3 diastolic CHF, who presented to the emergency room with acute onset of worsening dyspnea after minimal exertion with associated mild orthopnea and cough productive of yellowish sputum without wheezing.  He denies any chest pain or palpitations.  No dysuria, oliguria or hematuria or flank pain.  His Pulsoxymeter was dropping to 85% upon ambulation in the ER on room air.  He denied any fever or chills.  The patient has not had his medications since discharge.  He was residing at [facility] group home for homeless.   He was recently admitted to [facility] for atrial fibrillation with RVR as well as altered mental status with alcohol intoxication and was discharged on 8/31.    Hospital Course by Problem:   Acute hypoxic respiratory failure, POA Acute on chronic diastolic congestive heart failure Patient presenting to ED with acute shortness of breath.  Patient is afebrile, imaging negative for PE, no consolidation concerning for  infectious etiology.  BNP elevated on admission.  Patient has lost his medications and likely etiology is noncompliance. -- resume home meds and encourage compliance -- Metoprolol succinate 12.5 mg p.o. daily   Paroxysmal atrial fibrillation -- Amiodarone 200 mg p.o. daily -- Metoprolol succinate 12.5 mg p.o. daily -- Xarelto 20 mg p.o. daily   Essential hypertension -- Metoprolol succinate 12.5 mg p.o. daily   EtOH use disorder Counseled on need for complete cessation.  Social work for substance abuse resources.   Depression/anxiety: Zoloft 50 mg p.o. daily   Hyperlipidemia: Crestor 10 mg p.o. daily   Homelessness Social work for evaluation -Production manager given   Weakness/deconditioning/gait disturbance: --PT/OT evaluation- no need for follow up  Hypomagnesemia -replete   Medical Consultants:   none   Discharge Exam:   Vitals:   03/25/22 0454 03/25/22 0855  BP: 95/76   Pulse: 76 77  Resp: 16   Temp: 98 F (36.7 C)   SpO2: 99%    Vitals:   03/24/22 2121 03/25/22 0454 03/25/22 0500 03/25/22 0855  BP: 127/83 95/76    Pulse: 77 76  77  Resp: 18 16    Temp: 98.5 F (36.9 C) 98 F (36.7 C)    TempSrc: Oral Oral    SpO2: 100% 99%    Weight:   64.5 kg   Height:        General exam: Appears calm and comfortable.   The results of significant diagnostics from this hospitalization (including imaging, microbiology, ancillary and laboratory) are listed below for reference.     Procedures and Diagnostic Studies:   CT  Angio Chest PE W/Cm &/Or Wo Cm  Result Date: 03/23/2022 CLINICAL DATA:  Pulmonary embolism (PE) suspected, high prob EXAM: CT ANGIOGRAPHY CHEST WITH CONTRAST TECHNIQUE: Multidetector CT imaging of the chest was performed using the standard protocol during bolus administration of intravenous contrast. Multiplanar CT image reconstructions and MIPs were obtained to evaluate the vascular anatomy. RADIATION DOSE REDUCTION: This exam was performed  according to the departmental dose-optimization program which includes automated exposure control, adjustment of the mA and/or kV according to patient size and/or use of iterative reconstruction technique. CONTRAST:  161m OMNIPAQUE IOHEXOL 350 MG/ML SOLN COMPARISON:  Radiograph earlier today.  Chest CTA 1 week ago FINDINGS: Cardiovascular: There are no filling defects within the pulmonary arteries to suggest pulmonary embolus. The heart is normal in size, there are coronary artery calcifications. Aortic atherosclerosis and tortuosity, no acute aortic findings. No pericardial effusion. Mediastinum/Nodes: No enlarged mediastinal or hilar lymph nodes, stable subcentimeter short axis anterior paratracheal node. No esophageal wall thickening. Postsurgical change in the distal esophagus. No thyroid nodule. Lungs/Pleura: Minimal biapical pleuroparenchymal scarring. Dependent atelectasis, basilar assessment obscured by breathing motion. Benign calcified granuloma in the right upper lobe, needing no further follow-up. Minimal retained mucus in the trachea. No pleural fluid. Upper Abdomen: Postsurgical change of the proximal stomach. Additional surgical clips in the left upper quadrant. No acute upper abdominal findings. Musculoskeletal: Stable appearance of multiple healing and chronic lower rib fractures. No acute osseous findings. Stable small posterior disc osteophyte complex at T6-T7. No chest wall soft tissue abnormalities Review of the MIP images confirms the above findings. IMPRESSION: 1. No pulmonary embolus or acute intrathoracic abnormality. 2. Coronary artery calcifications. Aortic Atherosclerosis (ICD10-I70.0). Electronically Signed   By: MKeith RakeM.D.   On: 03/23/2022 21:08   DG Chest 2 View  Result Date: 03/23/2022 CLINICAL DATA:  Shortness of breath EXAM: CHEST - 2 VIEW COMPARISON:  03/15/2022 FINDINGS: The heart size and mediastinal contours are within normal limits. Both lungs are clear. Disc  degenerative disease of the thoracic spine. IMPRESSION: No acute abnormality of the lungs. Electronically Signed   By: ADelanna AhmadiM.D.   On: 03/23/2022 14:59     Labs:   Basic Metabolic Panel: Recent Labs  Lab 03/19/22 0324 03/23/22 1424 03/24/22 0339 03/25/22 0411  NA 134* 138 138 136  K 4.2 4.5 3.8 3.5  CL 108 106 108 103  CO2 21* '22 23 26  '$ GLUCOSE 89 81 104* 101*  BUN '8 15 15 16  '$ CREATININE 0.68 0.73 0.78 0.93  CALCIUM 7.4* 8.9 8.4* 8.2*  MG 1.4*  --   --   --    GFR Estimated Creatinine Clearance: 71.3 mL/min (by C-G formula based on SCr of 0.93 mg/dL). Liver Function Tests: Recent Labs  Lab 03/23/22 1424  AST 52*  ALT 26  ALKPHOS 106  BILITOT 1.2  PROT 7.3  ALBUMIN 2.9*   No results for input(s): "LIPASE", "AMYLASE" in the last 168 hours. No results for input(s): "AMMONIA" in the last 168 hours. Coagulation profile No results for input(s): "INR", "PROTIME" in the last 168 hours.  CBC: Recent Labs  Lab 03/19/22 0324 03/23/22 1502 03/24/22 0339  WBC 4.2 4.2 5.6  NEUTROABS  --  2.1  --   HGB 9.3* 10.6* 10.3*  HCT 28.1* 33.2* 31.2*  MCV 108.1* 109.9* 108.3*  PLT 219 317 274   Cardiac Enzymes: No results for input(s): "CKTOTAL", "CKMB", "CKMBINDEX", "TROPONINI" in the last 168 hours. BNP: Invalid input(s): "POCBNP" CBG: No results  for input(s): "GLUCAP" in the last 168 hours. D-Dimer No results for input(s): "DDIMER" in the last 72 hours. Hgb A1c No results for input(s): "HGBA1C" in the last 72 hours. Lipid Profile No results for input(s): "CHOL", "HDL", "LDLCALC", "TRIG", "CHOLHDL", "LDLDIRECT" in the last 72 hours. Thyroid function studies No results for input(s): "TSH", "T4TOTAL", "T3FREE", "THYROIDAB" in the last 72 hours.  Invalid input(s): "FREET3" Anemia work up No results for input(s): "VITAMINB12", "FOLATE", "FERRITIN", "TIBC", "IRON", "RETICCTPCT" in the last 72 hours. Microbiology Recent Results (from the past 240 hour(s))   Culture, blood (routine x 2)     Status: None   Collection Time: 03/15/22  4:03 PM   Specimen: BLOOD  Result Value Ref Range Status   Specimen Description BLOOD RIGHT ANTECUBITAL  Final   Special Requests   Final    BOTTLES DRAWN AEROBIC AND ANAEROBIC Blood Culture adequate volume   Culture   Final    NO GROWTH 5 DAYS Performed at [facility], [address] [place], [place] [address]    Report Status 03/20/2022 FINAL  Final  Culture, blood (routine x 2)     Status: None   Collection Time: 03/15/22  4:08 PM   Specimen: BLOOD  Result Value Ref Range Status   Specimen Description BLOOD LEFT ANTECUBITAL  Final   Special Requests   Final    BOTTLES DRAWN AEROBIC AND ANAEROBIC Blood Culture adequate volume   Culture   Final    NO GROWTH 5 DAYS Performed at [facility], [place] [address] [place], [place] [address]    Report Status 03/20/2022 FINAL  Final  Resp Panel by RT-PCR (Flu A&B, Covid) Anterior Nasal Swab     Status: None   Collection Time: 03/23/22  5:41 PM   Specimen: Anterior Nasal Swab  Result Value Ref Range Status   SARS Coronavirus 2 by RT PCR NEGATIVE NEGATIVE Final    Comment: (NOTE) SARS-CoV-2 target nucleic acids are NOT DETECTED.  The SARS-CoV-2 RNA is generally detectable in upper respiratory specimens during the acute phase of infection. The lowest concentration of SARS-CoV-2 viral copies this assay can detect is 138 copies/mL. A negative result does not preclude SARS-Cov-2 infection and should not be used as the sole basis for treatment or other patient management decisions. A negative result may occur with  improper specimen collection/handling, submission of specimen other than nasopharyngeal swab, presence of viral mutation(s) within the areas targeted by this assay, and inadequate number of viral copies(<138 copies/mL). A negative result must be combined with clinical observations, patient history, and epidemiological information. The  expected result is Negative.  Fact Sheet for Patients:  EntrepreneurPulse.com.au  Fact Sheet for Healthcare Providers:  IncredibleEmployment.be  This test is no t yet approved or cleared by the [place] FDA and  has been authorized for detection and/or diagnosis of SARS-CoV-2 by FDA under an Emergency Use Authorization (EUA). This EUA will remain  in effect (meaning this test can be used) for the duration of the COVID-19 declaration under Section 564(b)(1) of the Act, 21 U.S.C.section 360bbb-3(b)(1), unless the authorization is terminated  or revoked sooner.       Influenza A by PCR NEGATIVE NEGATIVE Final   Influenza B by PCR NEGATIVE NEGATIVE Final    Comment: (NOTE) The Xpert Xpress SARS-CoV-2/FLU/RSV plus assay is intended as an aid in the diagnosis of influenza from Nasopharyngeal swab specimens and should not be used as a sole basis for treatment. Nasal washings and aspirates  are unacceptable for Xpert Xpress SARS-CoV-2/FLU/RSV testing.  Fact Sheet for Patients: EntrepreneurPulse.com.au  Fact Sheet for Healthcare Providers: IncredibleEmployment.be  This test is not yet approved or cleared by the [place] FDA and has been authorized for detection and/or diagnosis of SARS-CoV-2 by FDA under an Emergency Use Authorization (EUA). This EUA will remain in effect (meaning this test can be used) for the duration of the COVID-19 declaration under Section 564(b)(1) of the Act, 21 U.S.C. section 360bbb-3(b)(1), unless the authorization is terminated or revoked.  Performed at [facility], [place] [address], [place], [place] [address]      Discharge Instructions:   Discharge Instructions     (HEART FAILURE PATIENTS) Call MD:  Anytime you have any of the following symptoms: 1) 3 pound weight gain in 24 hours or 5 pounds in 1 week 2) shortness of breath, with or without a dry  hacking cough 3) swelling in the hands, feet or stomach 4) if you have to sleep on extra pillows at night in order to breathe.   Complete by: As directed    Diet - low sodium heart healthy   Complete by: As directed    Heart Failure patients record your daily weight using the same scale at the same time of day   Complete by: As directed    Increase activity slowly   Complete by: As directed       Allergies as of 03/25/2022       Reactions   Other Itching, Other (See Comments)   Seasonal allergies- Itchy eyes, runny nose, congestion   Poison Ivy Extract Rash        Medication List     STOP taking these medications    aspirin EC 325 MG tablet       TAKE these medications    acetaminophen 325 MG tablet Commonly known as: TYLENOL Take 325-650 mg by mouth every 6 (six) hours as needed for headache (or pain).   amiodarone 200 MG tablet Commonly known as: PACERONE TAKE 1 TABLET (200 MG TOTAL) BY MOUTH DAILY.   ascorbic acid 500 MG tablet Commonly known as: VITAMIN C Take 500 mg by mouth daily as needed (for supplementation).   B Complex Plus Tabs Take 1 tablet by mouth daily as needed (for supplementation).   CALCIUM PO Take 1 tablet by mouth 2 (two) times a week.   Centrum Silver 50+Men Tabs Take 1 tablet by mouth daily with breakfast.   Dulera 100-5 MCG/ACT Aero Generic drug: mometasone-formoterol INHALE 2 PUFFS INTO THE LUNGS TWO TIMES DAILY.   ferrous sulfate 325 (65 FE) MG tablet Take 1 tablet (325 mg total) by mouth daily as needed (for supplementation).   folic acid 1 MG tablet Commonly known as: FOLVITE Take 1 tablet (1 mg total) by mouth daily.   furosemide 20 MG tablet Commonly known as: LASIX TAKE 1 TABLET (20 MG TOTAL) BY MOUTH DAILY.   Gas-X 80 MG chewable tablet Generic drug: simethicone Chew 80-160 mg by mouth every 6 (six) hours as needed for flatulence.   loratadine 10 MG tablet Commonly known as: CLARITIN TAKE 1 TABLET (10 MG TOTAL)  BY MOUTH DAILY. What changed: how much to take   magnesium chloride 64 MG Tbec SR tablet Commonly known as: SLOW-MAG Take 1 tablet (64 mg total) by mouth daily.   methocarbamol 750 MG tablet Commonly known as: ROBAXIN Take 750 mg by mouth 4 (four) times daily.   metoprolol succinate 25 MG 24 hr  tablet Commonly known as: TOPROL-XL Take 1/2 tablet (12.5 mg total) by mouth daily.   rivaroxaban 20 MG Tabs tablet Commonly known as: XARELTO TAKE 1 TABLET (20 MG TOTAL) BY MOUTH DAILY WITH SUPPER.   rosuvastatin 10 MG tablet Commonly known as: CRESTOR TAKE 1 TABLET (10 MG TOTAL) BY MOUTH DAILY.   sertraline 50 MG tablet Commonly known as: ZOLOFT Take 50 mg by mouth daily.   thiamine 100 MG tablet Commonly known as: VITAMIN B1 Take 1 tablet (100 mg total) by mouth daily.   VITAMIN D3 PO Take 1 capsule by mouth daily as needed (for supplementation).   ZINC PO Take 1 tablet by mouth daily as needed (for supplementation).          Time coordinating discharge: 45 min  Signed:  Geradine Girt DO  [facility] 03/25/2022, 9:05 AM

## 2022-03-27 ENCOUNTER — Inpatient Hospital Stay: Payer: Self-pay | Admitting: Student

## 2022-05-16 ENCOUNTER — Emergency Department (HOSPITAL_COMMUNITY): Payer: 59

## 2022-05-16 ENCOUNTER — Emergency Department (HOSPITAL_COMMUNITY)
Admission: EM | Admit: 2022-05-16 | Discharge: 2022-05-16 | Discharge status: Against Medical Advice | Payer: 59 | Attending: Physician Assistant | Admitting: Physician Assistant

## 2022-05-16 ENCOUNTER — Other Ambulatory Visit: Payer: Self-pay

## 2022-05-16 ENCOUNTER — Encounter (HOSPITAL_COMMUNITY): Payer: Self-pay | Admitting: *Deleted

## 2022-05-16 DIAGNOSIS — R0602 Shortness of breath: Secondary | ICD-10-CM | POA: Diagnosis present

## 2022-05-16 DIAGNOSIS — Z5321 Procedure and treatment not carried out due to patient leaving prior to being seen by health care provider: Secondary | ICD-10-CM | POA: Insufficient documentation

## 2022-05-16 LAB — BASIC METABOLIC PANEL
Anion gap: 13 (ref 5–15)
BUN: 10 mg/dL (ref 8–23)
CO2: 20 mmol/L — ABNORMAL LOW (ref 22–32)
Calcium: 8.1 mg/dL — ABNORMAL LOW (ref 8.9–10.3)
Chloride: 107 mmol/L (ref 98–111)
Creatinine, Ser: 0.88 mg/dL (ref 0.61–1.24)
GFR, Estimated: >60 mL/min (ref >60)
Glucose, Bld: 95 mg/dL (ref 70–99)
Potassium: 3.7 mmol/L (ref 3.5–5.1)
Sodium: 140 mmol/L (ref 135–145)

## 2022-05-16 LAB — CBC WITH DIFFERENTIAL/PLATELET
Abs Immature Granulocytes: 0.01 10*3/uL (ref 0.00–0.07)
Basophils Absolute: 0.1 10*3/uL (ref 0.0–0.1)
Basophils Relative: 1 %
Eosinophils Absolute: 0.1 10*3/uL (ref 0.0–0.5)
Eosinophils Relative: 1 %
HCT: 27.2 % — ABNORMAL LOW (ref 39.0–52.0)
Hemoglobin: 9.3 g/dL — ABNORMAL LOW (ref 13.0–17.0)
Immature Granulocytes: 0 %
Lymphocytes Relative: 47 %
Lymphs Abs: 2.4 10*3/uL (ref 0.7–4.0)
MCH: 37.2 pg — ABNORMAL HIGH (ref 26.0–34.0)
MCHC: 34.2 g/dL (ref 30.0–36.0)
MCV: 108.8 fL — ABNORMAL HIGH (ref 80.0–100.0)
Monocytes Absolute: 0.8 10*3/uL (ref 0.1–1.0)
Monocytes Relative: 16 %
Neutro Abs: 1.8 10*3/uL (ref 1.7–7.7)
Neutrophils Relative %: 35 %
Platelets: 135 10*3/uL — ABNORMAL LOW (ref 150–400)
RBC: 2.5 MIL/uL — ABNORMAL LOW (ref 4.22–5.81)
RDW: 16.7 % — ABNORMAL HIGH (ref 11.5–15.5)
WBC: 5.2 10*3/uL (ref 4.0–10.5)
nRBC: 0 % (ref 0.0–0.2)

## 2022-05-16 LAB — TROPONIN I (HIGH SENSITIVITY)
Troponin I (High Sensitivity): 13 ng/L (ref <18)
Troponin I (High Sensitivity): 14 ng/L (ref <18)

## 2022-05-16 LAB — ETHANOL: Alcohol, Ethyl (B): 312 mg/dL (ref <10)

## 2022-05-16 LAB — BRAIN NATRIURETIC PEPTIDE: B Natriuretic Peptide: 465.3 pg/mL — ABNORMAL HIGH (ref 0.0–100.0)

## 2022-05-16 NOTE — ED Provider Triage Note (Signed)
Emergency Medicine Provider Triage Evaluation Note  Anthony [name] , a 66 y.o. male  was evaluated in triage.  Pt complains of sob. Chronic. Picked up by EMS at [facility]. States he has been drinking alcohol, hx of DT No LE swelling. Hx of PE, not on anticoagulation  Review of Systems  Positive: Sob Negative:   Physical Exam  There were no vitals taken for this visit. Gen:   Awake, no distress   Resp:  Normal effort  MSK:   Moves extremities without difficulty  Other:    Medical Decision Making  Medically screening exam initiated at 1:57 AM.  Appropriate orders placed.  Danile A Uselton was informed that the remainder of the evaluation will be completed by another provider, this initial triage assessment does not replace that evaluation, and the importance of remaining in the ED until their evaluation is complete.  SOB   Jolayne Branson A, PA-C 05/16/22 0159

## 2022-05-16 NOTE — ED Triage Notes (Signed)
The pt arrived by [facility] from home the pt is intoxicated no visible sob talks in sentences without stopping for air he reports that he drinks alcohol every day slurred  speech

## 2022-05-16 NOTE — ED Notes (Signed)
Saw [facility] asleep at [facility]. Seen pt come back to ED waiting room. Called pt x3 no answer. Checked outside ED and pt is not out there.

## 2022-05-16 NOTE — ED Notes (Signed)
Pt was seen going to [employer] about 5 hours prior to this note and Pt has not been seen since.

## 2022-05-19 ENCOUNTER — Observation Stay (HOSPITAL_COMMUNITY): Payer: 59

## 2022-05-19 ENCOUNTER — Inpatient Hospital Stay (HOSPITAL_COMMUNITY)
Admission: EM | Admit: 2022-05-19 | Discharge: 2022-06-02 | DRG: 871 | Disposition: A | Discharge status: Skilled Nursing Facility | Payer: 59 | Attending: Family Medicine | Admitting: Family Medicine

## 2022-05-19 ENCOUNTER — Observation Stay (HOSPITAL_BASED_OUTPATIENT_CLINIC_OR_DEPARTMENT_OTHER): Payer: 59

## 2022-05-19 ENCOUNTER — Other Ambulatory Visit: Payer: Self-pay

## 2022-05-19 ENCOUNTER — Encounter (HOSPITAL_COMMUNITY): Payer: Self-pay | Admitting: Emergency Medicine

## 2022-05-19 ENCOUNTER — Emergency Department (HOSPITAL_COMMUNITY): Payer: 59

## 2022-05-19 DIAGNOSIS — Z1152 Encounter for screening for COVID-19: Secondary | ICD-10-CM

## 2022-05-19 DIAGNOSIS — Z781 Physical restraint status: Secondary | ICD-10-CM

## 2022-05-19 DIAGNOSIS — F1020 Alcohol dependence, uncomplicated: Secondary | ICD-10-CM | POA: Diagnosis present

## 2022-05-19 DIAGNOSIS — E440 Moderate protein-calorie malnutrition: Secondary | ICD-10-CM | POA: Insufficient documentation

## 2022-05-19 DIAGNOSIS — K222 Esophageal obstruction: Secondary | ICD-10-CM

## 2022-05-19 DIAGNOSIS — Z823 Family history of stroke: Secondary | ICD-10-CM

## 2022-05-19 DIAGNOSIS — T424X5A Adverse effect of benzodiazepines, initial encounter: Secondary | ICD-10-CM | POA: Diagnosis not present

## 2022-05-19 DIAGNOSIS — M4856XA Collapsed vertebra, not elsewhere classified, lumbar region, initial encounter for fracture: Secondary | ICD-10-CM | POA: Diagnosis present

## 2022-05-19 DIAGNOSIS — Y906 Blood alcohol level of 120-199 mg/100 ml: Secondary | ICD-10-CM | POA: Diagnosis present

## 2022-05-19 DIAGNOSIS — I5033 Acute on chronic diastolic (congestive) heart failure: Secondary | ICD-10-CM | POA: Diagnosis present

## 2022-05-19 DIAGNOSIS — M7989 Other specified soft tissue disorders: Secondary | ICD-10-CM | POA: Diagnosis not present

## 2022-05-19 DIAGNOSIS — A419 Sepsis, unspecified organism: Secondary | ICD-10-CM | POA: Diagnosis not present

## 2022-05-19 DIAGNOSIS — I4891 Unspecified atrial fibrillation: Secondary | ICD-10-CM | POA: Diagnosis not present

## 2022-05-19 DIAGNOSIS — R238 Other skin changes: Secondary | ICD-10-CM

## 2022-05-19 DIAGNOSIS — R0602 Shortness of breath: Secondary | ICD-10-CM | POA: Diagnosis not present

## 2022-05-19 DIAGNOSIS — B379 Candidiasis, unspecified: Secondary | ICD-10-CM | POA: Diagnosis present

## 2022-05-19 DIAGNOSIS — F10939 Alcohol use, unspecified with withdrawal, unspecified: Secondary | ICD-10-CM | POA: Diagnosis present

## 2022-05-19 DIAGNOSIS — Z91199 Patient's noncompliance with other medical treatment and regimen due to unspecified reason: Secondary | ICD-10-CM

## 2022-05-19 DIAGNOSIS — R600 Localized edema: Secondary | ICD-10-CM | POA: Diagnosis not present

## 2022-05-19 DIAGNOSIS — Z86711 Personal history of pulmonary embolism: Secondary | ICD-10-CM

## 2022-05-19 DIAGNOSIS — I480 Paroxysmal atrial fibrillation: Secondary | ICD-10-CM | POA: Diagnosis not present

## 2022-05-19 DIAGNOSIS — T18198A Other foreign object in esophagus causing other injury, initial encounter: Secondary | ICD-10-CM | POA: Diagnosis present

## 2022-05-19 DIAGNOSIS — L03116 Cellulitis of left lower limb: Secondary | ICD-10-CM | POA: Diagnosis present

## 2022-05-19 DIAGNOSIS — Z5902 Unsheltered homelessness: Secondary | ICD-10-CM

## 2022-05-19 DIAGNOSIS — K7010 Alcoholic hepatitis without ascites: Secondary | ICD-10-CM | POA: Diagnosis present

## 2022-05-19 DIAGNOSIS — E871 Hypo-osmolality and hyponatremia: Secondary | ICD-10-CM | POA: Insufficient documentation

## 2022-05-19 DIAGNOSIS — E876 Hypokalemia: Secondary | ICD-10-CM | POA: Diagnosis not present

## 2022-05-19 DIAGNOSIS — G9389 Other specified disorders of brain: Secondary | ICD-10-CM

## 2022-05-19 DIAGNOSIS — G8929 Other chronic pain: Secondary | ICD-10-CM | POA: Diagnosis present

## 2022-05-19 DIAGNOSIS — Z79899 Other long term (current) drug therapy: Secondary | ICD-10-CM

## 2022-05-19 DIAGNOSIS — F10931 Alcohol use, unspecified with withdrawal delirium: Secondary | ICD-10-CM

## 2022-05-19 DIAGNOSIS — K760 Fatty (change of) liver, not elsewhere classified: Secondary | ICD-10-CM | POA: Diagnosis present

## 2022-05-19 DIAGNOSIS — Z8249 Family history of ischemic heart disease and other diseases of the circulatory system: Secondary | ICD-10-CM

## 2022-05-19 DIAGNOSIS — D72829 Elevated white blood cell count, unspecified: Secondary | ICD-10-CM | POA: Diagnosis present

## 2022-05-19 DIAGNOSIS — M545 Low back pain, unspecified: Secondary | ICD-10-CM

## 2022-05-19 DIAGNOSIS — T18108A Unspecified foreign body in esophagus causing other injury, initial encounter: Secondary | ICD-10-CM

## 2022-05-19 DIAGNOSIS — Z87891 Personal history of nicotine dependence: Secondary | ICD-10-CM

## 2022-05-19 DIAGNOSIS — R7989 Other specified abnormal findings of blood chemistry: Secondary | ICD-10-CM

## 2022-05-19 DIAGNOSIS — R6520 Severe sepsis without septic shock: Secondary | ICD-10-CM

## 2022-05-19 DIAGNOSIS — E46 Unspecified protein-calorie malnutrition: Secondary | ICD-10-CM | POA: Diagnosis present

## 2022-05-19 DIAGNOSIS — I10 Essential (primary) hypertension: Secondary | ICD-10-CM | POA: Diagnosis present

## 2022-05-19 DIAGNOSIS — F1024 Alcohol dependence with alcohol-induced mood disorder: Secondary | ICD-10-CM | POA: Diagnosis present

## 2022-05-19 DIAGNOSIS — R410 Disorientation, unspecified: Secondary | ICD-10-CM | POA: Insufficient documentation

## 2022-05-19 DIAGNOSIS — Z7901 Long term (current) use of anticoagulants: Secondary | ICD-10-CM

## 2022-05-19 DIAGNOSIS — D539 Nutritional anemia, unspecified: Secondary | ICD-10-CM | POA: Diagnosis present

## 2022-05-19 DIAGNOSIS — D72828 Other elevated white blood cell count: Secondary | ICD-10-CM

## 2022-05-19 DIAGNOSIS — Z91148 Patient's other noncompliance with medication regimen for other reason: Secondary | ICD-10-CM

## 2022-05-19 DIAGNOSIS — I509 Heart failure, unspecified: Secondary | ICD-10-CM

## 2022-05-19 DIAGNOSIS — K449 Diaphragmatic hernia without obstruction or gangrene: Secondary | ICD-10-CM | POA: Diagnosis present

## 2022-05-19 DIAGNOSIS — Z85828 Personal history of other malignant neoplasm of skin: Secondary | ICD-10-CM

## 2022-05-19 DIAGNOSIS — B372 Candidiasis of skin and nail: Secondary | ICD-10-CM

## 2022-05-19 DIAGNOSIS — F10231 Alcohol dependence with withdrawal delirium: Secondary | ICD-10-CM | POA: Diagnosis present

## 2022-05-19 DIAGNOSIS — K2970 Gastritis, unspecified, without bleeding: Secondary | ICD-10-CM

## 2022-05-19 DIAGNOSIS — K2990 Gastroduodenitis, unspecified, without bleeding: Secondary | ICD-10-CM | POA: Diagnosis present

## 2022-05-19 DIAGNOSIS — M549 Dorsalgia, unspecified: Secondary | ICD-10-CM

## 2022-05-19 DIAGNOSIS — E8721 Acute metabolic acidosis: Secondary | ICD-10-CM | POA: Diagnosis present

## 2022-05-19 DIAGNOSIS — Z659 Problem related to unspecified psychosocial circumstances: Secondary | ICD-10-CM

## 2022-05-19 DIAGNOSIS — S91109A Unspecified open wound of unspecified toe(s) without damage to nail, initial encounter: Secondary | ICD-10-CM | POA: Diagnosis present

## 2022-05-19 DIAGNOSIS — I110 Hypertensive heart disease with heart failure: Secondary | ICD-10-CM | POA: Diagnosis present

## 2022-05-19 DIAGNOSIS — J189 Pneumonia, unspecified organism: Secondary | ICD-10-CM | POA: Diagnosis present

## 2022-05-19 DIAGNOSIS — Z809 Family history of malignant neoplasm, unspecified: Secondary | ICD-10-CM

## 2022-05-19 DIAGNOSIS — Z609 Problem related to social environment, unspecified: Secondary | ICD-10-CM

## 2022-05-19 DIAGNOSIS — W44E2XA Non-magnetic metal coin entering into or through a natural orifice, initial encounter: Secondary | ICD-10-CM | POA: Diagnosis present

## 2022-05-19 DIAGNOSIS — R195 Other fecal abnormalities: Secondary | ICD-10-CM

## 2022-05-19 DIAGNOSIS — G9341 Metabolic encephalopathy: Secondary | ICD-10-CM

## 2022-05-19 DIAGNOSIS — Z6820 Body mass index (BMI) 20.0-20.9, adult: Secondary | ICD-10-CM

## 2022-05-19 HISTORY — DX: Unspecified foreign body in esophagus causing other injury, initial encounter: T18.108A

## 2022-05-19 LAB — CBC WITH DIFFERENTIAL/PLATELET
Abs Immature Granulocytes: 0.4 10*3/uL — ABNORMAL HIGH (ref 0.00–0.07)
Basophils Absolute: 0 10*3/uL (ref 0.0–0.1)
Basophils Relative: 0 %
Eosinophils Absolute: 0 10*3/uL (ref 0.0–0.5)
Eosinophils Relative: 0 %
HCT: 28.9 % — ABNORMAL LOW (ref 39.0–52.0)
Hemoglobin: 9.8 g/dL — ABNORMAL LOW (ref 13.0–17.0)
Lymphocytes Relative: 2 %
Lymphs Abs: 0.3 10*3/uL — ABNORMAL LOW (ref 0.7–4.0)
MCH: 38 pg — ABNORMAL HIGH (ref 26.0–34.0)
MCHC: 33.9 g/dL (ref 30.0–36.0)
MCV: 112 fL — ABNORMAL HIGH (ref 80.0–100.0)
Metamyelocytes Relative: 3 %
Monocytes Absolute: 0.3 10*3/uL (ref 0.1–1.0)
Monocytes Relative: 2 %
Neutro Abs: 13.8 10*3/uL — ABNORMAL HIGH (ref 1.7–7.7)
Neutrophils Relative %: 93 %
Platelets: 172 10*3/uL (ref 150–400)
RBC: 2.58 MIL/uL — ABNORMAL LOW (ref 4.22–5.81)
RDW: 17.9 % — ABNORMAL HIGH (ref 11.5–15.5)
WBC: 14.8 10*3/uL — ABNORMAL HIGH (ref 4.0–10.5)
nRBC: 0 % (ref 0.0–0.2)
nRBC: 0 /100 WBC

## 2022-05-19 LAB — URINALYSIS, ROUTINE W REFLEX MICROSCOPIC
Bilirubin Urine: NEGATIVE
Glucose, UA: NEGATIVE mg/dL
Ketones, ur: NEGATIVE mg/dL
Leukocytes,Ua: NEGATIVE
Nitrite: NEGATIVE
Protein, ur: NEGATIVE mg/dL
Specific Gravity, Urine: 1.008 (ref 1.005–1.030)
pH: 5 (ref 5.0–8.0)

## 2022-05-19 LAB — RESP PANEL BY RT-PCR (FLU A&B, COVID) ARPGX2
Influenza A by PCR: NEGATIVE
Influenza B by PCR: NEGATIVE
SARS Coronavirus 2 by RT PCR: NEGATIVE

## 2022-05-19 LAB — COMPREHENSIVE METABOLIC PANEL
ALT: 45 U/L — ABNORMAL HIGH (ref 0–44)
AST: 150 U/L — ABNORMAL HIGH (ref 15–41)
Albumin: 2.8 g/dL — ABNORMAL LOW (ref 3.5–5.0)
Alkaline Phosphatase: 56 U/L (ref 38–126)
Anion gap: 17 — ABNORMAL HIGH (ref 5–15)
BUN: 14 mg/dL (ref 8–23)
CO2: 13 mmol/L — ABNORMAL LOW (ref 22–32)
Calcium: 7.8 mg/dL — ABNORMAL LOW (ref 8.9–10.3)
Chloride: 106 mmol/L (ref 98–111)
Creatinine, Ser: 0.89 mg/dL (ref 0.61–1.24)
GFR, Estimated: >60 mL/min (ref >60)
Glucose, Bld: 80 mg/dL (ref 70–99)
Potassium: 4.1 mmol/L (ref 3.5–5.1)
Sodium: 136 mmol/L (ref 135–145)
Total Bilirubin: 0.7 mg/dL (ref 0.3–1.2)
Total Protein: 6.6 g/dL (ref 6.5–8.1)

## 2022-05-19 LAB — MAGNESIUM
Magnesium: 1 mg/dL — ABNORMAL LOW (ref 1.7–2.4)
Magnesium: 1.9 mg/dL (ref 1.7–2.4)

## 2022-05-19 LAB — RAPID URINE DRUG SCREEN, HOSP PERFORMED
Amphetamines: NOT DETECTED
Barbiturates: NOT DETECTED
Benzodiazepines: NOT DETECTED
Cocaine: NOT DETECTED
Opiates: NOT DETECTED
Tetrahydrocannabinol: NOT DETECTED

## 2022-05-19 LAB — APTT: aPTT: 62 seconds — ABNORMAL HIGH (ref 24–36)

## 2022-05-19 LAB — PROTIME-INR
INR: 3.6 — ABNORMAL HIGH (ref 0.8–1.2)
Prothrombin Time: 35.3 seconds — ABNORMAL HIGH (ref 11.4–15.2)

## 2022-05-19 LAB — T4, FREE: Free T4: 0.79 ng/dL (ref 0.61–1.12)

## 2022-05-19 LAB — TSH: TSH: 0.977 u[IU]/mL (ref 0.350–4.500)

## 2022-05-19 LAB — BRAIN NATRIURETIC PEPTIDE: B Natriuretic Peptide: 996.8 pg/mL — ABNORMAL HIGH (ref 0.0–100.0)

## 2022-05-19 LAB — LACTIC ACID, PLASMA
Lactic Acid, Venous: 5 mmol/L (ref 0.5–1.9)
Lactic Acid, Venous: 1.8 mmol/L (ref 0.5–1.9)

## 2022-05-19 LAB — TROPONIN I (HIGH SENSITIVITY)
Troponin I (High Sensitivity): 33 ng/L — ABNORMAL HIGH (ref <18)
Troponin I (High Sensitivity): 28 ng/L — ABNORMAL HIGH (ref <18)

## 2022-05-19 LAB — ETHANOL: Alcohol, Ethyl (B): 121 mg/dL — ABNORMAL HIGH (ref <10)

## 2022-05-19 MED ORDER — METOPROLOL SUCCINATE ER 25 MG PO TB24
12.5000 mg | ORAL_TABLET | Freq: Once | ORAL | Status: AC
  Administered 2022-05-19: 12.5 mg via ORAL
  Filled 2022-05-19: qty 1

## 2022-05-19 MED ORDER — MAGNESIUM SULFATE IN D5W 1-5 GM/100ML-% IV SOLN
1.0000 g | Freq: Once | INTRAVENOUS | Status: AC
  Administered 2022-05-19: 1 g via INTRAVENOUS
  Filled 2022-05-19: qty 100

## 2022-05-19 MED ORDER — METOPROLOL SUCCINATE ER 25 MG PO TB24
12.5000 mg | ORAL_TABLET | Freq: Every day | ORAL | Status: DC
  Administered 2022-05-20 – 2022-06-02 (×12): 12.5 mg via ORAL
  Filled 2022-05-19 (×14): qty 1

## 2022-05-19 MED ORDER — PHENOBARBITAL SODIUM 130 MG/ML IJ SOLN
65.0000 mg | Freq: Three times a day (TID) | INTRAMUSCULAR | Status: AC
  Administered 2022-05-21 – 2022-05-23 (×6): 65 mg via INTRAVENOUS
  Filled 2022-05-19 (×6): qty 1

## 2022-05-19 MED ORDER — PHENOBARBITAL SODIUM 130 MG/ML IJ SOLN
97.5000 mg | Freq: Three times a day (TID) | INTRAMUSCULAR | Status: AC
  Administered 2022-05-19 – 2022-05-21 (×6): 97.5 mg via INTRAVENOUS
  Filled 2022-05-19 (×2): qty 1
  Filled 2022-05-19: qty 0.75
  Filled 2022-05-19 (×3): qty 1

## 2022-05-19 MED ORDER — ACETAMINOPHEN 325 MG PO TABS
650.0000 mg | ORAL_TABLET | Freq: Four times a day (QID) | ORAL | Status: DC | PRN
  Administered 2022-05-19: 650 mg via ORAL
  Filled 2022-05-19: qty 2

## 2022-05-19 MED ORDER — ROSUVASTATIN CALCIUM 5 MG PO TABS
10.0000 mg | ORAL_TABLET | Freq: Every day | ORAL | Status: DC
  Administered 2022-05-19 – 2022-06-02 (×15): 10 mg via ORAL
  Filled 2022-05-19 (×15): qty 2

## 2022-05-19 MED ORDER — ADULT MULTIVITAMIN W/MINERALS CH
1.0000 | ORAL_TABLET | Freq: Every day | ORAL | Status: DC
  Administered 2022-05-19 – 2022-06-02 (×14): 1 via ORAL
  Filled 2022-05-19 (×15): qty 1

## 2022-05-19 MED ORDER — AMIODARONE HCL IN DEXTROSE 360-4.14 MG/200ML-% IV SOLN
60.0000 mg/h | INTRAVENOUS | Status: DC
  Administered 2022-05-19 (×2): 60 mg/h via INTRAVENOUS
  Filled 2022-05-19 (×2): qty 200

## 2022-05-19 MED ORDER — NICOTINE 14 MG/24HR TD PT24: 14.0000 mg | MEDICATED_PATCH | Freq: Every day | TRANSDERMAL | Status: DC

## 2022-05-19 MED ORDER — AMIODARONE HCL 200 MG PO TABS
200.0000 mg | ORAL_TABLET | Freq: Once | ORAL | Status: AC
  Administered 2022-05-19: 200 mg via ORAL
  Filled 2022-05-19: qty 1

## 2022-05-19 MED ORDER — METHOCARBAMOL 500 MG PO TABS
750.0000 mg | ORAL_TABLET | Freq: Three times a day (TID) | ORAL | Status: DC | PRN
  Administered 2022-05-20 – 2022-05-24 (×5): 750 mg via ORAL
  Filled 2022-05-19 (×5): qty 2

## 2022-05-19 MED ORDER — LORAZEPAM 2 MG/ML IJ SOLN
1.0000 mg | INTRAMUSCULAR | Status: DC | PRN
  Administered 2022-05-19: 2 mg via INTRAVENOUS
  Administered 2022-05-19: 3 mg via INTRAVENOUS
  Filled 2022-05-19: qty 1
  Filled 2022-05-19: qty 2

## 2022-05-19 MED ORDER — MAGNESIUM SULFATE 50 % IJ SOLN: 4.0000 g | Freq: Once | INTRAMUSCULAR | Status: DC

## 2022-05-19 MED ORDER — FOLIC ACID 1 MG PO TABS
1.0000 mg | ORAL_TABLET | Freq: Every day | ORAL | Status: DC
  Administered 2022-05-19 – 2022-06-02 (×15): 1 mg via ORAL
  Filled 2022-05-19 (×15): qty 1

## 2022-05-19 MED ORDER — RIVAROXABAN 10 MG PO TABS: 20.0000 mg | ORAL_TABLET | Freq: Every day | ORAL | Status: DC

## 2022-05-19 MED ORDER — PHENOBARBITAL SODIUM 65 MG/ML IJ SOLN
32.5000 mg | Freq: Three times a day (TID) | INTRAMUSCULAR | Status: DC
  Administered 2022-05-23 – 2022-05-24 (×4): 32.5 mg via INTRAVENOUS
  Filled 2022-05-19 (×5): qty 1

## 2022-05-19 MED ORDER — LORAZEPAM 2 MG/ML IJ SOLN
1.0000 mg | Freq: Once | INTRAMUSCULAR | Status: AC
  Administered 2022-05-19: 1 mg via INTRAVENOUS
  Filled 2022-05-19: qty 1

## 2022-05-19 MED ORDER — LACTATED RINGERS IV SOLN: INTRAVENOUS | Status: DC

## 2022-05-19 MED ORDER — THIAMINE MONONITRATE 100 MG PO TABS
100.0000 mg | ORAL_TABLET | Freq: Every day | ORAL | Status: DC
  Administered 2022-05-19 – 2022-06-02 (×15): 100 mg via ORAL
  Filled 2022-05-19 (×15): qty 1

## 2022-05-19 MED ORDER — AMIODARONE LOAD VIA INFUSION
150.0000 mg | Freq: Once | INTRAVENOUS | Status: AC
  Administered 2022-05-19: 150 mg via INTRAVENOUS
  Filled 2022-05-19: qty 83.34

## 2022-05-19 MED ORDER — HEPARIN (PORCINE) 25000 UT/250ML-% IV SOLN
900.0000 [IU]/h | INTRAVENOUS | Status: DC
  Administered 2022-05-20: 900 [IU]/h via INTRAVENOUS
  Filled 2022-05-19: qty 250

## 2022-05-19 MED ORDER — AMIODARONE HCL IN DEXTROSE 360-4.14 MG/200ML-% IV SOLN: 30.0000 mg/h | INTRAVENOUS | Status: DC

## 2022-05-19 MED ORDER — AMIODARONE HCL IN DEXTROSE 360-4.14 MG/200ML-% IV SOLN
30.0000 mg/h | INTRAVENOUS | Status: DC
  Administered 2022-05-19 – 2022-05-21 (×3): 30 mg/h via INTRAVENOUS
  Filled 2022-05-19 (×3): qty 200

## 2022-05-19 MED ORDER — DILTIAZEM LOAD VIA INFUSION
10.0000 mg | Freq: Once | INTRAVENOUS | Status: AC
  Administered 2022-05-19: 10 mg via INTRAVENOUS
  Filled 2022-05-19: qty 10

## 2022-05-19 MED ORDER — FUROSEMIDE 10 MG/ML IJ SOLN
40.0000 mg | INTRAMUSCULAR | Status: AC
  Administered 2022-05-19: 40 mg via INTRAVENOUS
  Filled 2022-05-19: qty 4

## 2022-05-19 MED ORDER — LORAZEPAM 2 MG/ML IJ SOLN
1.0000 mg | INTRAMUSCULAR | Status: DC | PRN
  Administered 2022-05-19 – 2022-05-24 (×9): 1 mg via INTRAVENOUS
  Filled 2022-05-19 (×10): qty 1

## 2022-05-19 MED ORDER — MAGNESIUM SULFATE 4 GM/100ML IV SOLN
4.0000 g | Freq: Once | INTRAVENOUS | Status: AC
  Administered 2022-05-19: 4 g via INTRAVENOUS
  Filled 2022-05-19: qty 100

## 2022-05-19 MED ORDER — DILTIAZEM HCL-DEXTROSE 125-5 MG/125ML-% IV SOLN (PREMIX)
5.0000 mg/h | INTRAVENOUS | Status: DC
  Administered 2022-05-19: 5 mg/h via INTRAVENOUS
  Filled 2022-05-19: qty 125

## 2022-05-19 MED ORDER — AMIODARONE HCL 200 MG PO TABS: 200.0000 mg | ORAL_TABLET | Freq: Every day | ORAL | Status: DC

## 2022-05-19 MED ORDER — AMIODARONE LOAD VIA INFUSION: 150.0000 mg | Freq: Once | INTRAVENOUS | Status: DC

## 2022-05-19 MED ORDER — MAGNESIUM SULFATE 2 GM/50ML IV SOLN: 2.0000 g | Freq: Every day | INTRAVENOUS | Status: DC

## 2022-05-19 MED ORDER — VANCOMYCIN HCL 1500 MG/300ML IV SOLN
1500.0000 mg | Freq: Once | INTRAVENOUS | Status: AC
  Administered 2022-05-19: 1500 mg via INTRAVENOUS
  Filled 2022-05-19: qty 300

## 2022-05-19 MED ORDER — VANCOMYCIN HCL 750 MG/150ML IV SOLN
750.0000 mg | Freq: Two times a day (BID) | INTRAVENOUS | Status: DC
  Administered 2022-05-19 – 2022-05-22 (×4): 750 mg via INTRAVENOUS
  Filled 2022-05-19 (×6): qty 150

## 2022-05-19 MED ORDER — CLOTRIMAZOLE 1 % EX CREA
TOPICAL_CREAM | Freq: Two times a day (BID) | CUTANEOUS | Status: DC
  Administered 2022-05-22: 1 via TOPICAL
  Filled 2022-05-19 (×2): qty 15

## 2022-05-19 MED ORDER — SODIUM CHLORIDE 0.9 % IV SOLN
2.0000 g | INTRAVENOUS | Status: AC
  Administered 2022-05-19 – 2022-05-23 (×5): 2 g via INTRAVENOUS
  Filled 2022-05-19 (×5): qty 20

## 2022-05-19 MED ORDER — RIVAROXABAN 10 MG PO TABS
20.0000 mg | ORAL_TABLET | Freq: Once | ORAL | Status: AC
  Administered 2022-05-19: 20 mg via ORAL
  Filled 2022-05-19: qty 2

## 2022-05-19 MED ORDER — FUROSEMIDE 10 MG/ML IJ SOLN: 40.0000 mg | Freq: Once | INTRAMUSCULAR | Status: DC

## 2022-05-19 MED ORDER — ACETAMINOPHEN 325 MG PO TABS
650.0000 mg | ORAL_TABLET | Freq: Four times a day (QID) | ORAL | Status: DC | PRN
  Administered 2022-05-19 – 2022-05-29 (×9): 650 mg via ORAL
  Filled 2022-05-19 (×9): qty 2

## 2022-05-19 MED ORDER — LORAZEPAM 1 MG PO TABS: 1.0000 mg | ORAL_TABLET | ORAL | Status: DC | PRN

## 2022-05-19 MED ORDER — AMIODARONE HCL IN DEXTROSE 360-4.14 MG/200ML-% IV SOLN: 60.0000 mg/h | INTRAVENOUS | Status: DC

## 2022-05-19 MED ORDER — VANCOMYCIN HCL 750 MG/150ML IV SOLN
750.0000 mg | Freq: Two times a day (BID) | INTRAVENOUS | Status: DC
  Filled 2022-05-19: qty 150

## 2022-05-19 NOTE — Evaluation (Signed)
Clinical/Bedside Swallow Evaluation Patient Details  Name: [name] MRN: [id number] Date of Birth: [birth year]  Today's Date: 05/19/2022 Time: SLP Start Time (ACUTE ONLY): 1411 SLP Stop Time (ACUTE ONLY): 1423 SLP Time Calculation (min) (ACUTE ONLY): 12 min  Past Medical History:  Past Medical History:  Diagnosis Date   Actinic keratosis 11/27/2016   Alcohol abuse with alcohol-induced mood disorder ([place]) 07/18/2018   Alcohol use disorder, severe, dependence ([place]) 09/16/2006   05/22/2015 Argumentative, belligerent and verbally abusive to staff per his note from [facility])    Anxiety state 04/25/2018   Chronic low back pain 09/16/2006   Followed by NS.  Per his chart from [place], he fell off two storyhouse roof while cleaning his brother's gutters and sustained compression fracture of L3, L4 and L5 and fracture of right transverse process at L3 and nondisplaced fracture of left glenoid rim many years ago. 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 And 12/2017 admissions    History of pulmonary embolus (PE) 11/02/2016   Unprovoked 11/01/2016. Xarelto from 11/01/16 to 09/08/2017.   Hydrocele    Surgically corrected   Hypertension    Hypoalbuminemia due to protein-calorie malnutrition ([place]) 07/22/2020   Lumbar compression fracture (HCC)    Multiple rib fractures 07/22/2019   Left rib details XR: left ribs demonstrate multiple remote rib   Pancytopenia ([place])    Rhabdomyolysis 07/22/2020   Skin cancer    exciced 2017   Tinea versicolor 06/08/2013   Tobacco use disorder 07/22/2020   Tooth infection 04/25/2018   Trigger finger, acquired 11/18/2012    Ulnar tunnel syndrome of right wrist 11/08/2012   Past Surgical History:  Past Surgical History:  Procedure Laterality Date   CATARACT EXTRACTION     HIATAL HERNIA REPAIR  2016   HYDROCELE EXCISION / REPAIR  2010   SKIN CANCER EXCISION Left    Excised 2017   HPI:  Pt is a 66 yo male presenting with back pain, SOB, BLE swelling. Work up still underway but with concern for CHF exacerbation, sepsis. Pt found to have foreign body (suspected coin) in his distal esophagus with endoscopy currently on hold so cleared for clear liquids only per GI. Two prior swallow evals with functional appearing swallow in light of limited dentition, recommending Dys 3-regular solids and thin liquids. PMH includes: HH, EtOH and tobacco use, a-fib, anxiety, HFpEF, HTN, unhoused for the past year    Assessment / Plan / Recommendation  Clinical Impression  Pt is currently cleared for clear liquids only per GI pending ability to undergo endoscopy for foriegn body in esophagus. His mentation is quite altered (question how much is medication related) but when stimulated and more adequately alert, he drinks sips of water without overt signs of orophrayngeal dysphagia or aspiration. He has had adequate oropharyngeal function during previous evaluations, so hopeful that he will be able to progress as his mentation improves and as he is medically cleared. For now, would offer sips of thin liquids with assistance to ensure mentation and positioning are appropriate. SLP Visit Diagnosis: Dysphagia, unspecified (R13.10)    Aspiration Risk  Moderate aspiration risk    Diet  Recommendation Thin liquid (sips of clear liquids when alert)   Liquid Administration via: Spoon;Cup;Straw Medication Administration: Other (Comment) (per MD/GI - need to crush given foreign body?) Supervision: Staff to assist with self feeding;Full supervision/cueing for compensatory strategies Compensations: Slow rate;Small sips/bites Postural Changes:  Seated upright at 90 degrees;Remain upright for at least 30 minutes after po intake    Other  Recommendations Oral Care Recommendations: Oral care BID    Recommendations for follow up therapy are one component of a multi-disciplinary discharge planning process, led by the attending physician.  Recommendations may be updated based on patient status, additional functional criteria and insurance authorization.  Follow up Recommendations No SLP follow up      Assistance Recommended at Discharge None  Functional Status Assessment Patient has had a recent decline in their functional status and demonstrates the ability to make significant improvements in function in a reasonable and predictable amount of time.  Frequency and Duration min 2x/week  2 weeks       Prognosis Prognosis for Safe Diet Advancement: Good Barriers to Reach Goals: Cognitive deficits      Swallow Study   General HPI: Pt is a 66 yo male presenting with back pain, SOB, BLE swelling. Work up still underway but with concern for CHF exacerbation, sepsis. Pt found to have foreign body (suspected coin) in his distal esophagus with endoscopy currently on hold so cleared for clear liquids only per GI. Two prior swallow evals with functional appearing swallow in light of limited dentition, recommending Dys 3-regular solids and thin liquids. PMH includes: HH, EtOH and tobacco use, a-fib, anxiety, HFpEF, HTN, unhoused for the past year Type of Study: Bedside Swallow Evaluation Previous Swallow Assessment: see HPI Diet Prior to this Study: NPO Temperature Spikes Noted: No Respiratory Status: Room air History of Recent Intubation: No Behavior/Cognition: Lethargic/Drowsy Oral Care Completed by SLP: No Oral Cavity - Dentition: Poor condition;Missing dentition Self-Feeding Abilities: Total assist Patient Positioning: Upright in bed Baseline Vocal Quality: Normal Volitional Cough: Cognitively unable to elicit Volitional Swallow:  Unable to elicit    Oral/Motor/Sensory Function Overall Oral Motor/Sensory Function:  (not following commands to assess)   Ice Chips Ice chips: Not tested   Thin Liquid Thin Liquid: Within functional limits Presentation: Spoon;Straw    Nectar Thick Nectar Thick Liquid: Not tested   Honey Thick Honey Thick Liquid: Not tested   Puree Puree: Not tested   Solid     Solid: Not tested      [name]., M.A. [place] Office [phone]  Secure chat preferred  05/19/2022,3:45 PM

## 2022-05-19 NOTE — ED Notes (Signed)
Provider at bedside and updated

## 2022-05-19 NOTE — Progress Notes (Addendum)
Pharmacy Antibiotic Note  Anthony [name] is a 66 y.o. male admitted on 05/19/2022 with sepsis.  Pharmacy has been consulted for vancomycin dosing.  Patient presented to ED 10/31 with acute back pain and afib with RVR. Patient noted to be altered/confused this morning with a temperature of 100.7 and elevated WBC of 14.8. Scr 0.89--stable. Patient was also started on ceftriaxone 2g IV q24hrs.   Plan: Vancomycin '1500mg'$  IV once  Vancomycin '750mg'$  IV every 12 hours.  Goal trough 15-20 mcg/mL. (eAUC 487 using Scr 0.89 and Vd 0.72). Monitor renal function, clinical picture daily and vanc levels as appropriate.   Height: '5\' 10"'$  (177.8 cm) Weight: 64.5 kg (142 lb 3.2 oz) IBW/kg (Calculated) : 73  Temp (24hrs), Avg:99.5 F (37.5 C), Min:98.1 F (36.7 C), Sami:100.7 F (38.2 C)  Recent Labs  Lab 05/16/22 0204 05/19/22 0345  WBC 5.2 14.8*  CREATININE 0.88 0.89    Estimated Creatinine Clearance: 74.5 mL/min (by C-G formula based on SCr of 0.89 mg/dL).    Allergies  Allergen Reactions   Other Itching and Other (See Comments)    Seasonal allergies- Itchy eyes, runny nose, congestion   Poison Ivy Extract Rash    Antimicrobials this admission: ceftriaxone 10/31 >>  vancomycin 10/31 >>   Dose adjustments this admission: N/A  Microbiology results: 10/31 BCx: collected 10/31 UCx: not yet collected    Thank you for allowing pharmacy to be a part of this patient's care.   Billey Gosling, PharmD PGY1 Pharmacy Resident 10/31/20239:01 AM

## 2022-05-19 NOTE — ED Notes (Signed)
Provider at bedside

## 2022-05-19 NOTE — ED Notes (Signed)
While this RN was giving report on another pt, this pt attempted to get out of bed again. Pt is not following safe limits. Pt removed his penis form purewick and urinated on bed and is having a BM  on the bed. Pt continues to think that he can get up out of bed. Soft restraints obtained to be applied. Notified admitting team.

## 2022-05-19 NOTE — H&P (Addendum)
Hospital Admission History and Physical Service Pager: [phone]  Patient name: Anthony [name] Medical record number: [id number] Date of Birth: 12/24/1955 Age: 66 y.o. Gender: male  Primary Care Provider: Jacelyn Grip, MD Consultants: None Code Status: Full Preferred Emergency Contact: Anthony [name] (brother) [phone]  Chief Complaint: Back pain, shortness of breath and BLE swelling  Assessment and Plan: Anthony [name] is a 66 y.o. male presenting with back pain, SOB, and BLE swelling. Differential for this patient's presentation of this includes CHF exacerbation (most likely given now afib in RVR, exam, imaging, and history of difficulties with medications and resulting hospitalizations from uncontrolled CHF and afib), PE (less likely given bilateral LE swelling though lack of recent anticoagulation increases risk), pneumonia (less likely given lack of infectious symptoms and imaging findings), and ACS (less likely given no chest pain and EKG findings).  * Atrial fibrillation with RVR (HCC) HR in 120-140s. Overall asymptomatic. Has been off of his metoprolol, amiodarone, and xarelto for an unknown amount of time. Etiology likely multifactorial 2/2 medication nonadherence, CHF exacerbation, electrolyte disturbances, and ongoing alcohol use. -Admit to progressive, FMTS attending Dr. McDiarmid -s/p amio '200mg'$  PO and toprol 12.'5mg'$  PO in the ED -Continue home amiodarone, metoprolol, xarelto; can consider amiodarone drip should PO not control rates -Monitor electrolytes (Keep Mg>2 and K>4) -Cardiac telemetry -Vital signs per unit routine  Acute on chronic diastolic CHF (congestive heart failure) (HCC) Has 2+ pitting edema to BLE. CXR with early pulmonary congestion. Appears to be on lasix 20 mg PO daily outpatient but is unable to take medication. -S.p IV lasix 40 mg in ED -Will give additional IV lasix 40 mg this pm -Continue home metoprolol 12.5 mg daily -Strict I/Os, daily  weights  Elevated troponin Trops 33>28. Denies chest pain. Likely elevated in setting of demand ischemia with RVR and fluid overload. -Continue diuresis and rate control as above  Hypomagnesemia Mag 1.0 on admission -Replete with 4g IV Mag x1 -Consider daily supplementation  Poor social situation Currently unhoused. Likely contributing to lack of follow up with PCP and inability to obtain medications, leading to recurrent hospitalizations and chronic disease exacerbations. -TOC consult for potential dispo items, though has proved difficult in the past  Back pain Acute on chronic pain since yesterday. No neuro deficits on exam. Has been sleeping on benches outside which he says has exacerbated his pain. He usually takes a muscle relaxer and tylenol PM, though it has been a couple days since his last dose. Suspect pain 2/2 chronic compression fractures at L2-4.  -Tylenol, robaxin for pain -Can consider lidocaine patch and/or x-ray if not controlled -PT/OT  Alcohol use disorder, severe, dependence ([place]) Last had one beer yesterday morning. Before that, had a bottle of liquor over the weekend. Has history of withdrawals. He appears mildly tremulous on exam and mentions possible hallucinations of speaking with nurses who were not there before presentation. -CIWA protocol with ativan 1-4 mg prn -Thiamine, folate, MVI  Macrocytic anemia Chronic, stable. Likely 2/2 alcohol use. -Continue folate, thiamine, MVI as above -AM CBC  HTN (hypertension) BP elevated on exam, likely 2/2 acute back pain and fluid overload. Has not been on home lasix or metoprolol given inability to obtain medications. -Continue diuresis as above -Monitor BP -AM CMP  Foreign body in esophagus CXR shows likely coin in distal esophagus. Patient denies swallowing coin or other foreign body to his knowledge.   FEN/GI: Heart healthy diet VTE Prophylaxis: Outpatient prescribed Xarelto  Disposition:  Progressive  History of Present Illness:  Anthony [name] is a 66 y.o. male presenting with back pain, SOB and BLE swelling.  Patient complains of significant back pain. Describes it as "a bad ache" with intermittent stabbing pain. States he has known back problems for the past 15 years s/p fall injury. Worse over the past 1 day- now rated 10/10. Thinks it's worse due to sleeping outside on a wooden bench. Takes Tylenol pm and muscle relaxer as needed for back pain at baseline-- hasn't taken it for a few days but this regimen typically helps. Does not radiate into legs, no changes in bowel/bladder habits, no saddle anesthesia, no IVDU, no fever. No recent injury.  Also states he's "been collecting a lot of water" - ankles have been swelling, he's getting water in his lungs. Reports this is a longstanding problem and is not worse. Has minimal shortness of breath at the present time.  Notes he has been living on the street for the past 1 year. Has intermittently stayed in shelters, mainly when it's cold outside. Has not been compliant with his medications as he has trouble getting them and doesn't have a phone. Patient has come to the hospital several times over the last few months but hasn't seen his PCP in a long time.  States he's gotten multiple DUIs in the past. Told himself, "Anthony [name], you can quit drinking or quit driving. I knew I wasn't going to quit drinking so I quit driving". Drinks varying amounts-- up to 1 bottle of whiskey at a time. Last drink yesterday morning. Endorses a history of alcohol withdrawal.  Requesting a soda.  In the ED, he was found to be in afib with RVR with rates that improved somewhat with PO amiodarone. CXR demonstrated volume overload and likely a coin in esophagus. Exam was remarkable for BLE pitting edema. He was given IV lasix 40 mg along with metoprolol 12.5 mg and xarelto 20 mg.  Review Of Systems: Per HPI. Denies palpitations Denies dizziness  Pertinent Past  Medical History: A-fib HFpEF HTN Alcohol use disorder Remainder reviewed in history tab.   Pertinent Past Surgical History: None pertinent Remainder reviewed in history tab.   Pertinent Social History: Tobacco use: Former Alcohol use: daily, intake varies (up to 1 bottle of whiskey daily) Other substance use: none recently. Marijuana and crack cocaine Unhoused  Pertinent Family History: None pertinent Remainder reviewed in history tab.   Important Outpatient Medications: Amiodarone '300mg'$  daily Lasix '20mg'$  daily Metoprolol succinate 12.'5mg'$  daily Xarelto '20mg'$  daily Rosuvastatin '10mg'$  daily Sertraline '50mg'$  daily Remainder reviewed in medication history.   Objective: BP 127/80   Pulse (!) 138   Temp 98.1 F (36.7 C) (Oral)   Resp (!) 23   Ht '5\' 10"'$  (1.778 m)   Wt 64.5 kg   SpO2 100%   BMI 20.40 kg/m  Exam: General: Alert and oriented, in NAD, unkempt Skin: Warm, dry HEENT: NCAT, EOM grossly normal, midline nasal septum Cardiac: Irregularly irregular, no m/r/g appreciated, 2+ pitting edema to BLE Respiratory: CTAB anteriorly, breathing and speaking comfortably on RA Abdominal: Soft, nontender, nondistended, normoactive bowel sounds Extremities: Moves all extremities grossly equally in bed Neurological: Strength 5/5 throughout, sensation intact throughout Psychiatric: Appropriate mood and affect   Labs:  CBC BMET  Recent Labs  Lab 05/19/22 0345  WBC 14.8*  HGB 9.8*  HCT 28.9*  PLT 172   Recent Labs  Lab 05/19/22 0345  NA 136  K 4.1  CL 106  CO2 13*  BUN  14  CREATININE 0.89  GLUCOSE 80  CALCIUM 7.8*    Mg 1.0 BNP 996.8 Trops 33>28 Ethanol 121 Albumin 2.8 AST 150 ALT 45  EKG: Afib with RVR to 130s, occasional PVCs, no STEMI/BBB  Imaging Studies Performed:  CXR IMPRESSION: 1. Interval development of central pulmonary vascular engorgement in keeping with changes of early cardiogenic failure or volume overload. 2. Stable 26 mm metallic  foreign body, likely a coin, within the expected distal esophagus.  Ethelene Hal, MD 05/19/2022, 6:33 AM PGY-1, [facility] Intern pager: [phone], text pages welcome Secure chat group [facility] Upper-Level Resident Addendum   I have independently interviewed and examined the patient. I have discussed the above with Dr. Marcha Dutton and agree with the documented plan. My edits for correction/addition/clarification are included above. Please see any attending notes.   [name], MD PGY-3, [facility] 05/19/2022 6:51 AM  FPTS Service pager: [phone] (text pages welcome through [facility])

## 2022-05-19 NOTE — Assessment & Plan Note (Addendum)
Rate controled.  - Amio 200 mg BID x7 day (11/7-11/14), followed by 200 mg daily - Monitor electrolytes (Keep Mg>2 and K>4) - d/c anticoagulation at discharge

## 2022-05-19 NOTE — ED Notes (Signed)
Pt noted to be crawling out of bed. Pt unable to follow all commands. Pt confused. Pt tremorous. Door open, nonskid socks on. Pt noted to feel hot and temp obtained and 100.7. Notified admitting

## 2022-05-19 NOTE — ED Notes (Signed)
Notified admitting about CIWA 13

## 2022-05-19 NOTE — Assessment & Plan Note (Deleted)
Stable.  - trend CBC  - transfuse <7

## 2022-05-19 NOTE — ED Triage Notes (Signed)
Pt BIB EMS here for ShOB, back pain, BIL leg swelling. Found in a-fib RVR HR 180-205. '10mg'$  of cardizem with improvement 100-150.  EMS vitals: 122/76 81% RA, 100% 3LNC

## 2022-05-19 NOTE — ED Notes (Signed)
Safety sitter camera at bedside. This RN will hook it up as soon as able d/t high acuity assignment

## 2022-05-19 NOTE — ED Notes (Signed)
Pt cleaned up and bed pad changed

## 2022-05-19 NOTE — Progress Notes (Signed)
ANTICOAGULATION CONSULT NOTE - Initial Consult  Pharmacy Consult for Xarelto>>heparin Indication: atrial fibrillation  Allergies  Allergen Reactions   Other Itching and Other (See Comments)    Seasonal allergies- Itchy eyes, runny nose, congestion   Poison Ivy Extract Rash    Patient Measurements: Height: '5\' 10"'$  (177.8 cm) Weight: 64.5 kg (142 lb 3.2 oz) IBW/kg (Calculated) : 73 Heparin Dosing Weight: 64.5kg  Vital Signs: Temp: 100.7 F (38.2 C) (10/31 0735) Temp Source: Oral (10/31 0735) BP: 122/87 (10/31 0854) Pulse Rate: 142 (10/31 0854)  Labs: Recent Labs    05/19/22 0345 05/19/22 0530  HGB 9.8*  --   HCT 28.9*  --   PLT 172  --   CREATININE 0.89  --   TROPONINIHS 33* 28*    Estimated Creatinine Clearance: 74.5 mL/min (by C-G formula based on SCr of 0.89 mg/dL).   Medical History: Past Medical History:  Diagnosis Date   Actinic keratosis 11/27/2016   Alcohol abuse with alcohol-induced mood disorder ([place]) 07/18/2018   Alcohol use disorder, severe, dependence ([place]) 09/16/2006   05/22/2015 Argumentative, belligerent and verbally abusive to staff per his note from [facility])    Anxiety state 04/25/2018   Chronic low back pain 09/16/2006   Followed by NS.  Per his chart from [place], he fell off two storyhouse roof while cleaning his brother's gutters and sustained compression fracture of L3, L4 and L5 and fracture of right transverse process at L3 and nondisplaced fracture of left glenoid rim many years ago. He had multiple imaging in [place] including Lumbar MRI in 2016 which showed moderate to severe spinal canal stenos   Delirium tremens ([place]) 09/01/2017   Depression    Dry eye 11/23/2016   Hiatal hernia 09/14/2016   Status Collis gastroplasty and Nissen's fundoplication on 35/00/9381 in [place]   History of delirium tremons 07/22/2020   DTs during 10/2016 08/2017. And 12/2017 admissions    History of pulmonary embolus (PE)  11/02/2016   Unprovoked 11/01/2016. Xarelto from 11/01/16 to 09/08/2017.   Hydrocele    Surgically corrected   Hypertension    Hypoalbuminemia due to protein-calorie malnutrition ([place]) 07/22/2020   Lumbar compression fracture (HCC)    Multiple rib fractures 07/22/2019   Left rib details XR: left ribs demonstrate multiple remote rib   Pancytopenia (HCC)    Rhabdomyolysis 07/22/2020   Skin cancer    exciced 2017   Tinea versicolor 06/08/2013   Tobacco use disorder 07/22/2020   Tooth infection 04/25/2018   Trigger finger, acquired 11/18/2012   Ulnar tunnel syndrome of right wrist 11/08/2012    Medications:  Scheduled:   clotrimazole   Topical BID   folic acid  1 mg Oral Daily   [START ON 05/20/2022] metoprolol succinate  12.5 mg Oral Daily   multivitamin with minerals  1 tablet Oral Daily   rosuvastatin  10 mg Oral Daily   thiamine  100 mg Oral Daily   Infusions:   cefTRIAXone (ROCEPHIN)  IV Stopped (05/19/22 0915)   diltiazem (CARDIZEM) infusion 5 mg/hr (05/19/22 0845)   lactated ringers 100 mL/hr at 05/19/22 0846   [START ON 05/20/2022] magnesium sulfate bolus IVPB     magnesium sulfate bolus IVPB 4 g (05/19/22 0847)   vancomycin 1,500 mg (05/19/22 8299)   vancomycin      Assessment: 66 YO male presenting to ED on 10/31 with acute back pain and Afib with RVR. Patient is on rivaroxaban PTA for afib but last dose outside of  hospital is unknown. Rivaroxaban was re-started during hospital admission with his last dose being 10/31 @ 0528. Patient may require procedure(s) given poor clinical status and coin-like object in distal esophagus on imaging--pharmacy was consulted for heparin dosing. Rivaroxaban has now been discontinued and will start heparin drip 24hrs after last dose of rivaroxaban.   Baseline aPTT is subtherapeutic at 62. Hgb 9.8, plts 172--stable.   Goal of Therapy:  Heparin level 0.3-0.7 units/ml aPTT 66-102 seconds Monitor platelets by anticoagulation protocol: Yes   Plan:   Start heparin gtt at 900 units/hr on 11/1 @ 0600 F/u 8hr aPTT after heparin gtt initiation  Monitor aPTT, heparin level, CBC, and s/sx of bleeding daily  F/u correlation between aPTT and heparin level  F/u GI recs as to whether surgery is necessary    Billey Gosling, PharmD PGY1 Pharmacy Resident 10/31/202310:21 AM

## 2022-05-19 NOTE — ED Notes (Signed)
Admitting providers at bedside

## 2022-05-19 NOTE — ED Notes (Signed)
Patient provided with perineal care after BM. Patient dressed into new gown with fresh sheets/diaper/chucks pad. This is patient's 2nd loose BM since 1900.

## 2022-05-19 NOTE — Progress Notes (Signed)
Lower extremity venous has been completed.   Preliminary results in CV Proc.   Anthony [name] 05/19/2022 3:32 PM

## 2022-05-19 NOTE — ED Notes (Signed)
Notified attending about pt receiving '2mg'$  ativan for CIWA of 15

## 2022-05-19 NOTE — Consult Note (Addendum)
Cardiology Consultation   Patient ID: Anthony [name] MRN: [id number]; DOB: 12-12-1955  Admit date: 05/19/2022 Date of Consult: 05/19/2022  PCP:  Jacelyn Grip, [facility] Cardiologist:  Lauree Chandler, MD        Patient Profile:   Anthony [name] is a 66 y.o. male with a hx of atrial fib, HTN, ETOH abuse, diastolic HF, PAF, depression and unprovoked PE  has never followed up in office who is being seen 05/19/2022 for the evaluation of atrial fib with RVR at the request of Dr. McDiarmid.  History of Present Illness:   Anthony [name] with above hx and echo in 07/2020 with EF 55-60%, no RWMA, G2DD.  Discharged on toprol 12.5 and xarelto.  Anthony [name] never took the medications. On follow up admit pt placed on amiodarone 200 BID for a week then 200 daily.  Discharged to SNF.  In March 09/2020 echo with EF 60% and G2DD,  Anthony [name] had been admitted for DOE. + CHF. CT performed 09/23/2020 with patchy groundglass density in the left upper lobe with mild bibasilar atelectasis and scarring along with small calcific granuloma of the right upper lobe with no pleural effusion and no pneumothorax -felt to be cause of DOE and not amio toxicity.  Anthony [name] did have cCTA with Calcium scor 884, 91st percentile, There is a minimal non-obstructive (1-24%) calcified plaque in the diagonal vessel. There are tandem moderate stenoses (50-70%) calcified plaques in the mid vessel. There are tandem calcified plaques in the distal vessel (mild non-obstructive 25-49% and moderate stenosis 50-70%).  LCX is a non-dominant artery that gives rise to one large OM1 branch that bifurcates. There is a minimal non-obstructive (1-24%) soft plaque in the proximal vessel.  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  CT FFR with no evidence of significant functional stenosis.    Pt did not show for cardiology visits.    Pt presented today to ER by EMS with SOB, back pain and  bil leg swelling.  Was in a fib with RVR and given dilt 10 mg IV.   No recent anticoagulant,  given amio 200 po and toprol 12.5 in ER.  Given IV lasix in ER 40 mg.  Troponin minimally elevated at 33 pk. No chest pain, Mg+ repleted   Unhoused. Living on the street for 1 year.  Difficulty obtaining meds does not follow up  today placed on IV dilt drip.   EKG:  The EKG was personally reviewed and demonstrates:  atrial fib with PVC and baseline artifact though rate is irregular irregular.  No acute ST changes Telemetry:  Telemetry was personally reviewed and demonstrates:  atrial fib  Mg+ 1.0 HS troponin 14, 33, 28   BNP 996 Na 1326, K+ 4.1 Cr 0.89 gap 17, albumin 2.8, AST 150, ALT 45 WBC 14.8  Hgb 9.8 MCV 112 plts 172 TSH 0.977  free T4 0.79  ETOH level 121 Neg COVID NEG flu  blood cultures done INR 3.6   PCXR IMPRESSION: 1. Interval development of central pulmonary vascular engorgement in keeping with changes of early cardiogenic failure or volume overload. 2. Stable 26 mm metallic foreign body, likely a coin, within the expected distal esophagus  BP 122/84 P 117 R 29   neg 497 if accurate  Past Medical History:  Diagnosis Date   Actinic keratosis 11/27/2016   Alcohol abuse with alcohol-induced mood disorder ([place]) 07/18/2018   Alcohol use disorder, severe,  dependence ([place]) 09/16/2006   05/22/2015 Argumentative, belligerent and verbally abusive to staff per his note from [facility])    Anxiety state 04/25/2018   Chronic low back pain 09/16/2006   Followed by NS.  Per his chart from [place], Anthony [name] fell off two storyhouse roof while cleaning his brother's gutters and sustained compression fracture of L3, L4 and L5 and fracture of right transverse process at L3 and nondisplaced fracture of left glenoid rim many years ago. Anthony [name] had multiple imaging in [place] including Lumbar MRI in 2016 which showed moderate to severe spinal canal stenos   Delirium tremens ([place])  09/01/2017   Depression    Dry eye 11/23/2016   Hiatal hernia 09/14/2016   Status Collis gastroplasty and Nissen's fundoplication on 54/27/0623 in [place]   History of delirium tremons 07/22/2020   DTs during 10/2016 08/2017. And 12/2017 admissions    History of pulmonary embolus (PE) 11/02/2016   Unprovoked 11/01/2016. Xarelto from 11/01/16 to 09/08/2017.   Hydrocele    Surgically corrected   Hypertension    Hypoalbuminemia due to protein-calorie malnutrition ([place]) 07/22/2020   Lumbar compression fracture ([facility])    Multiple rib fractures 07/22/2019   Left rib details XR: left ribs demonstrate multiple remote rib   Pancytopenia ([place])    Rhabdomyolysis 07/22/2020   Skin cancer    exciced 2017   Tinea versicolor 06/08/2013   Tobacco use disorder 07/22/2020   Tooth infection 04/25/2018   Trigger finger, acquired 11/18/2012   Ulnar tunnel syndrome of right wrist 11/08/2012    Past Surgical History:  Procedure Laterality Date   CATARACT EXTRACTION     HIATAL HERNIA REPAIR  2016   HYDROCELE EXCISION / REPAIR  2010   SKIN CANCER EXCISION Left    Excised 2017     Home Medications:  Prior to Admission medications   Medication Sig Start Date End Date Taking? Authorizing Provider  acetaminophen (TYLENOL) 325 MG tablet Take 325-650 mg by mouth every 6 (six) hours as needed for headache (or pain). Patient not taking: Reported on 03/23/2022    [provider]  amiodarone (PACERONE) 200 MG tablet TAKE 1 TABLET (200 MG TOTAL) BY MOUTH DAILY. Patient taking differently: Take 200 mg by mouth daily. 03/25/22 03/25/23  Geradine Girt, DO  ascorbic acid (VITAMIN C) 500 MG tablet Take 500 mg by mouth daily as needed (for supplementation).    [provider]  B Complex-Folic Acid (B COMPLEX PLUS) TABS Take 1 tablet by mouth daily as needed (for supplementation).    [provider]  CALCIUM PO Take 1 tablet by mouth 2 (two) times a week.    [provider]  Cholecalciferol  (VITAMIN D3 PO) Take 1 capsule by mouth daily as needed (for supplementation).    [provider]  ferrous sulfate 325 (65 FE) MG tablet Take 1 tablet (325 mg total) by mouth daily as needed (for supplementation). 03/19/22   Sharion Settler, DO  folic acid (FOLVITE) 1 MG tablet Take 1 tablet (1 mg total) by mouth daily. 03/20/22   Sharion Settler, DO  furosemide (LASIX) 20 MG tablet TAKE 1 TABLET (20 MG TOTAL) BY MOUTH DAILY. Patient taking differently: Take 20 mg by mouth daily. 03/25/22 03/25/23  Geradine Girt, DO  loratadine (CLARITIN) 10 MG tablet TAKE 1 TABLET (10 MG TOTAL) BY MOUTH DAILY. Patient taking differently: Take 10 mg by mouth daily. 10/08/20 03/23/22  Lilland, Alana, DO  magnesium chloride (SLOW-MAG) 64 MG TBEC  SR tablet Take 1 tablet (64 mg total) by mouth daily. 10/09/20   Lilland, Alana, DO  methocarbamol (ROBAXIN) 750 MG tablet Take 750 mg by mouth 4 (four) times daily.    [provider]  metoprolol succinate (TOPROL-XL) 25 MG 24 hr tablet Take 1/2 tablet (12.5 mg total) by mouth daily. 03/25/22   Geradine Girt, DO  mometasone-formoterol (DULERA) 100-5 MCG/ACT AERO INHALE 2 PUFFS INTO THE LUNGS TWO TIMES DAILY. 03/19/22 03/19/23  Sharion Settler, DO  Multiple Vitamins-Minerals (CENTRUM SILVER 50+MEN) TABS Take 1 tablet by mouth daily with breakfast.    [provider]  rivaroxaban (XARELTO) 20 MG TABS tablet TAKE 1 TABLET (20 MG TOTAL) BY MOUTH DAILY WITH SUPPER. 03/25/22 03/25/23  Eulogio Bear U, DO  rosuvastatin (CRESTOR) 10 MG tablet TAKE 1 TABLET (10 MG TOTAL) BY MOUTH DAILY. 03/19/22 03/19/23  Sharion Settler, DO  sertraline (ZOLOFT) 50 MG tablet Take 50 mg by mouth daily.    [provider]  simethicone (GAS-X) 80 MG chewable tablet Chew 80-160 mg by mouth every 6 (six) hours as needed for flatulence.    [provider]  thiamine (VITAMIN B1) 100 MG tablet Take 1 tablet (100 mg total) by mouth daily. 03/20/22   Sharion Settler,  DO    Inpatient Medications: Scheduled Meds:  clotrimazole   Topical BID   folic acid  1 mg Oral Daily   [START ON 05/20/2022] metoprolol succinate  12.5 mg Oral Daily   multivitamin with minerals  1 tablet Oral Daily   PHENObarbital  97.5 mg Intravenous Q8H   Followed by   [START ON 05/21/2022] PHENObarbital  65 mg Intravenous Q8H   Followed by   Derrill Memo ON 05/23/2022] PHENObarbital  32.5 mg Intravenous Q8H   rosuvastatin  10 mg Oral Daily   thiamine  100 mg Oral Daily   Continuous Infusions:  amiodarone 60 mg/hr (05/19/22 1353)   Followed by   amiodarone     cefTRIAXone (ROCEPHIN)  IV Stopped (05/19/22 0915)   [START ON 05/20/2022] heparin     lactated ringers 100 mL/hr at 05/19/22 0846   vancomycin     PRN Meds: acetaminophen, LORazepam, methocarbamol  Allergies:    Allergies  Allergen Reactions   Other Itching and Other (See Comments)    Seasonal allergies- Itchy eyes, runny nose, congestion   Poison Ivy Extract Rash    Social History:   Social History   Socioeconomic History   Marital status: Single    Spouse name: Not on file   Number of children: Not on file   Years of education: Not on file   Highest education level: Not on file  Occupational History   Occupation: unemployed  Tobacco Use   Smoking status: Never   Smokeless tobacco: Current    Types: Snuff   Tobacco comments:    Pt states do snuff when stressed had 1 can last 3 month//PL  Vaping Use   Vaping Use: Never used  Substance and Sexual Activity   Alcohol use: Yes    Alcohol/week: 43.0 standard drinks of alcohol    Types: 42 Cans of beer, 1 Standard drinks or equivalent per week    Comment: daily use-6 beers daily and whiskey when Anthony [name] has it.   Drug use: Yes    Types: Marijuana    Comment: occasional-none this year per pt   Sexual activity: Yes    Birth control/protection: None  Other Topics Concern   Not on file  Social History Narrative  Lives with a friend. Has children but hasn't met  them in a while. Anthony [name] doesn't know where they live.    Social Determinants of Health   Financial Resource Strain: Not on file  Food Insecurity: Food Insecurity Present (03/24/2022)   Hunger Vital Sign    Worried About Running Out of Food in the Last Year: Often true    Ran Out of Food in the Last Year: Often true  Transportation Needs: Unmet Transportation Needs (03/24/2022)   PRAPARE - Hydrologist (Medical): Yes    Lack of Transportation (Non-Medical): Yes  Physical Activity: Not on file  Stress: Not on file  Social Connections: Not on file  Intimate Partner Violence: Not At Risk (03/24/2022)   Humiliation, Afraid, Rape, and Kick questionnaire    Fear of Current or Ex-Partner: No    Emotionally Abused: No    Physically Abused: No    Sexually Abused: No    Family History:    Family History  Problem Relation Age of Onset   Heart attack Father        died at 79 years   Dementia Father    Stroke Father    Cancer Sister    Colon cancer Neg Hx    Colon polyps Neg Hx    Esophageal cancer Neg Hx    Stomach cancer Neg Hx    Rectal cancer Neg Hx      ROS:  Please see the history of present illness.  General:no colds or fevers, no weight changes Skin:no rashes or ulcers HEENT:no blurred vision, no congestion CV:see HPI PUL:see HPI GI:no diarrhea constipation or melena, no indigestion GU:no hematuria, no dysuria MS:no joint pain, no claudication Neuro:no syncope, no lightheadedness Endo:no diabetes, no thyroid disease  All other ROS reviewed and negative.     Physical Exam/Data:   Vitals:   05/19/22 1215 05/19/22 1230 05/19/22 1245 05/19/22 1400  BP: 102/83 107/74 118/81 122/85  Pulse: 94 (!) 110 (!) 118 (!) 122  Resp: (!) 28 (!) 24 (!) 21 (!) 25  Temp:      TempSrc:      SpO2: (!) 85% (!) 84% 92% 95%  Weight:      Height:        Intake/Output Summary (Last 24 hours) at 05/19/2022 1453 Last data filed at 05/19/2022 1104 Gross per 24 hour   Intake 502.64 ml  Output 1000 ml  Net -497.36 ml      05/19/2022    3:36 AM 05/16/2022    2:10 AM 03/25/2022    5:00 AM  Last 3 Weights  Weight (lbs) 142 lb 3.2 oz 142 lb 3.2 oz 142 lb 3.2 oz  Weight (kg) 64.5 kg 64.5 kg 64.501 kg     Body mass index is 20.4 kg/m.   Exam per Dr. Harriet Masson  General:  Well nourished, well developed, in no acute distress HEENT: normal Neck: no JVD Vascular: No carotid bruits; Distal pulses 2+ bilaterally Cardiac:  irreg irreg; no murmur no gallup or rub Lungs:  rales to auscultation bilaterally, no wheezing, rhonchi or rales  Abd: soft, nontender, no hepatomegaly  Ext: no edema Musculoskeletal:  No deformities, BUE and BLE strength normal and equal Skin: warm and dry  Neuro:  CNs 2-12 intact, no focal abnormalities noted Psych:  Normal affect   Relevant CV Studies: Cardiac CTA 10/02/20 IMPRESSION: 1. Coronary calcium score of 884. This was 91st percentile for age, sex, and race matched control.  2. Normal coronary origin.  Right dominance.   3. CAD-RADS 3. Moderate stenosis. Consider symptom-guided anti-ischemic pharmacotherapy as well as risk factor modification per guideline directed care. Additional analysis with CT FFR will be submitted.   4. 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   CT FFR analysis shows no evidence of significant functional stenosis. Recommend aggressive risk factor management for non-obstructive calcifications.  Echo 03/17/22 1. Left ventricular ejection fraction, by estimation, is 55 to 60%. The  left ventricle has normal function. The left ventricle has no regional  wall motion abnormalities. Left ventricular diastolic parameters are  consistent with Grade III diastolic  dysfunction (restrictive). Elevated left atrial pressure.   2. Right ventricular systolic function is normal. The right ventricular  size is mildly enlarged. There is normal pulmonary  artery systolic  pressure. The estimated right ventricular systolic pressure is 66.0 mmHg.   3. Left atrial size was mildly dilated.   4. The mitral valve is abnormal. Mild to moderate mitral valve  regurgitation. No evidence of mitral stenosis.   5. The aortic valve is tricuspid. There is mild calcification of the  aortic valve. There is mild thickening of the aortic valve. Aortic valve  regurgitation is not visualized. No aortic stenosis is present.   6. The inferior vena cava is normal in size with greater than 50%  respiratory variability, suggesting right atrial pressure of 3 mmHg.   Comparison(s): Mitral regurgitation increased from prior.   FINDINGS   Left Ventricle: Left ventricular ejection fraction, by estimation, is 55  to 60%. The left ventricle has normal function. The left ventricle has no  regional wall motion abnormalities. The left ventricular internal cavity  size was normal in size. There is   no left ventricular hypertrophy. Left ventricular diastolic parameters  are consistent with Grade III diastolic dysfunction (restrictive).  Elevated left atrial pressure.   Right Ventricle: The right ventricular size is mildly enlarged. No  increase in right ventricular wall thickness. Right ventricular systolic  function is normal. There is normal pulmonary artery systolic pressure.  The tricuspid regurgitant velocity is 2.82   m/s, and with an assumed right atrial pressure of 3 mmHg, the estimated  right ventricular systolic pressure is 63.0 mmHg.   Left Atrium: Left atrial size was mildly dilated.   Right Atrium: Right atrial size was normal in size.   Pericardium: There is no evidence of pericardial effusion.   Mitral Valve: Mitral valve billowing. The mitral valve is abnormal. Mild  to moderate mitral valve regurgitation. No evidence of mitral valve  stenosis.   Tricuspid Valve: The tricuspid valve is normal in structure. Tricuspid  valve regurgitation is mild .  No evidence of tricuspid stenosis.   Aortic Valve: The aortic valve is tricuspid. There is mild calcification  of the aortic valve. There is mild thickening of the aortic valve. Aortic  valve regurgitation is not visualized. No aortic stenosis is present.   Pulmonic Valve: The pulmonic valve was normal in structure. Pulmonic valve  regurgitation is not visualized. No evidence of pulmonic stenosis.   Aorta: The aortic root and ascending aorta are structurally normal, with  no evidence of dilitation.   Venous: The inferior vena cava is normal in size with greater than 50%  respiratory variability, suggesting right atrial pressure of 3 mmHg.   IAS/Shunts: No atrial level shunt detected by color flow Doppler.       Laboratory Data:  High Sensitivity Troponin:  Recent Labs  Lab 05/16/22 0204 05/16/22 0405 05/19/22 0345 05/19/22 0530  TROPONINIHS 13 14 33* 28*     Chemistry Recent Labs  Lab 05/16/22 0204 05/19/22 0345 05/19/22 0530  NA 140 136  --   K 3.7 4.1  --   CL 107 106  --   CO2 20* 13*  --   GLUCOSE 95 80  --   BUN 10 14  --   CREATININE 0.88 0.89  --   CALCIUM 8.1* 7.8*  --   MG  --   --  1.0*  GFRNONAA >60 >60  --   ANIONGAP 13 17*  --     Recent Labs  Lab 05/19/22 0345  PROT 6.6  ALBUMIN 2.8*  AST 150*  ALT 45*  ALKPHOS 56  BILITOT 0.7   Lipids No results for input(s): "CHOL", "TRIG", "HDL", "LABVLDL", "LDLCALC", "CHOLHDL" in the last 168 hours.  Hematology Recent Labs  Lab 05/16/22 0204 05/19/22 0345  WBC 5.2 14.8*  RBC 2.50* 2.58*  HGB 9.3* 9.8*  HCT 27.2* 28.9*  MCV 108.8* 112.0*  MCH 37.2* 38.0*  MCHC 34.2 33.9  RDW 16.7* 17.9*  PLT 135* 172   Thyroid  Recent Labs  Lab 05/19/22 0345  TSH 0.977  FREET4 0.79    BNP Recent Labs  Lab 05/16/22 0209 05/19/22 0345  BNP 465.3* 996.8*    DDimer No results for input(s): "DDIMER" in the last 168 hours.   Radiology/Studies:  US Abdomen Limited  Result Date:  05/19/2022 CLINICAL DATA:  Elevated liver enzymes EXAM: ULTRASOUND ABDOMEN LIMITED RIGHT UPPER QUADRANT COMPARISON:  11/03/2016 FINDINGS: Gallbladder: No gallstones or wall thickening visualized. No sonographic Murphy sign noted by sonographer. Common bile duct: Diameter: 5 mm. Liver: No focal lesion identified. Diffusely increased hepatic parenchymal echogenicity. Portal vein is patent on color Doppler imaging with normal direction of blood flow towards the liver. Other: None. IMPRESSION: The echogenicity of the liver is increased. This is a nonspecific finding but is most commonly seen with fatty infiltration of the liver. There are no obvious focal liver lesions. Electronically Signed   By: Davina Poke D.O.   On: 05/19/2022 11:02   DG Chest Port 1 View  Result Date: 05/19/2022 CLINICAL DATA:  Atrial fibrillation EXAM: PORTABLE CHEST 1 VIEW COMPARISON:  05/16/2022 FINDINGS: Lungs are clear. No pneumothorax or pleural effusion. Metallic density compatible with a 26 mm coin is again seen in the expected location of the distal esophagus above the level the fundoplication noted on CT examination of 03/23/2022. Cardiac size within normal limits. Central pulmonary arteries demonstrate vascular engorgement, progressive since prior examination in keeping with changes of early cardiogenic failure or volume overload. No superimposed overt pulmonary edema. Healed right rib fracture noted. IMPRESSION: 1. Interval development of central pulmonary vascular engorgement in keeping with changes of early cardiogenic failure or volume overload. 2. 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 Electronically Signed   By: Fidela Salisbury M.D.   On: 05/19/2022 04:07   DG Chest 2 View  Result Date: 05/16/2022 CLINICAL DATA:  Shortness of breath EXAM: CHEST - 2 VIEW COMPARISON:  03/23/2022 FINDINGS: Normal cardiac and mediastinal contours. No acute focal pulmonary opacity. No  pleural effusion or pneumothorax. A round, thin, metal object, which measures up to 2.6 cm, appears to be in the distal esophagus, most likely a coin. Surgical clips in the left upper quadrant. IMPRESSION: 1. No acute cardiopulmonary process. 2. A round, thin,  metal object appears to be in the distal esophagus, possibly a coin. Correlate with patient history of ingestion. Electronically Signed   By: Merilyn Baba M.D.   On: 05/16/2022 03:10     Assessment and Plan:   Atrial fib with RVR now on dilt drip, has been on amiodarone as outpt.  On xarelto though may have missed some doses.  Now seems per orders to be on amiodarone drip.  Will need to monitor LFTs, TSH is normal. Repeat echo once HR slower.  GI prefers pt on heparin for now with need for endoscopy. 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  Dopplers pending Hx of unprovoked PE.  On xarelto. Foreign body in  distal esophagus, GI has seen possible endoscopy once improved    Risk Assessment/Risk Scores:     New York Heart Association (NYHA) Functional Class NYHA Class III  CHA2DS2-VASc Score = 4   This indicates a 4.8% annual risk of stroke. The patient's score is based upon: CHF History: 1 HTN History: 1 Diabetes History: 0 Stroke History: 0 Vascular Disease History: 1 Age Score: 1 Gender Score: 0   For questions or updates, please contact [facility] Please consult [email or web address] for contact info under  Signed, Cecilie Kicks, NP  05/19/2022 2:53 PM  Patient seen and examined, note reviewed with the signed Advanced Practice Provider. I personally reviewed laboratory data, imaging studies and relevant notes. I independently examined the patient and formulated the important aspects of  the plan. I have personally discussed the plan with the patient and/or family. Comments or changes to the note/plan are indicated below.  Patient is seen and examined. Anthony [name] was sleeping when I arrived Anthony [name] opens his eyes to voice.  Still in afib but rate not yet quite controlled continue the Cardizem gtt. Recommend restarting home dose amiodarone. Continue hep gtt for now until GI procedure is completed No need for further ischemic evaluation at this time.  Berniece Salines DO, MS [facility] Attending Cardiologist [place]  [address] [place], [place] [address] [phone] Website: [email or web address]

## 2022-05-19 NOTE — Sepsis Progress Note (Signed)
Sepsis protocol monitored by eLink 

## 2022-05-19 NOTE — Assessment & Plan Note (Deleted)
1.6, re-pleated.

## 2022-05-19 NOTE — Assessment & Plan Note (Addendum)
Currently unhoused. Likely contributing to lack of follow up with PCP and inability to obtain medications, leading to recurrent hospitalizations and chronic disease exacerbations. -TOC consult for dispo planning  - planning SNF placement for dispo

## 2022-05-19 NOTE — Assessment & Plan Note (Deleted)
BP Stable overnight.  -Monitor BP -Cont metoprolol

## 2022-05-19 NOTE — Assessment & Plan Note (Deleted)
Trops 33>28. Denies chest pain. Likely elevated in setting of demand ischemia with RVR and fluid overload. -Continue diuresis and rate control as above

## 2022-05-19 NOTE — Progress Notes (Signed)
PT Cancellation Note  Patient Details Name: Anthony [name] MRN: [id number] DOB: 1956-07-05   Cancelled Treatment:    Reason Eval/Treat Not Completed: Other (comment) Order for PT begins tomorrow (11/1).  Chipper Oman, SPT   Aizik Reh 05/19/2022, 7:50 AM

## 2022-05-19 NOTE — Progress Notes (Signed)
OT Cancellation Note  Patient Details Name: Anthony [name] MRN: [id number] DOB: 01-31-1956   Cancelled Treatment:    Reason Eval/Treat Not Completed: Medical issues which prohibited therapy;Fatigue/lethargy limiting ability to participate Noted OT orders for start tomorrow, 11/1. Discussed with nursing - pt currently in etoh w/d, received ativan and lethargic/resting now. Will follow up tomorrow for OT eval.  Layla Maw 05/19/2022, 1:35 PM

## 2022-05-19 NOTE — ED Notes (Signed)
Patient provided with bedside urinal 

## 2022-05-19 NOTE — Consult Note (Addendum)
[facility] Consult: 8:13 AM 05/19/2022  LOS: 0 days    Referring Provider: Dr McDiarmid  Primary Care Physician:  Jacelyn Grip, MD Primary Gastroenterologist:  2014 OV w Dr Fuller Plan.       Reason for Consultation:  metallic, coin-like object in distal esophagus on imaging.     HPI: Anthony [name] is a 66 y.o. male.  PMH EtOH use disorder, dependence.  A-fib, RVR.  IDA 2019, 09/2020.  PE 2018.  Grade 3 diastolic CHF.  Depression.  Chronic back pain.  10/2016 ultrasound showed heterogeneous, increased hepatic echo probably fatty infiltrative change or other hepatocellular disease.  No suspicious masses or contour abnormalities suggesting cirrhosis or malignancy.  No ascites.  Surgeries include hydrocele repair 2012, hiatal hernia repair, skin cancer excision, cataracts.  Medication, medical care noncompliant. Devious meds include Xarelto, amiodarone, beta-blocker, iron, thiamine, B complex vitamin amongst others. [place] for few days in late August with intoxication, A-fib/RVR. Admission for a few days in early September with acute on chronic CHF D.  He had lost his medication.  No PE on CT.  Was supposed to continue on multiple meds including Xarelto for his PAF. Care complicated by homelessness.  In 2014 had office visit with GI for intermittent minor rectal bleeding.  There were external hemorrhoids on visual inspection.  Canceled and then no showed for planned colonoscopy in 2014 or 05/2019.Marland Kitchen  ED visit 10/28 via EMS.  Eval intoxication, slurred speech.  Left ED before seen by provider.  CXR showed coin like object in distal esophagus.    Returned to ED early this AM via EMS.  C/O SOB, back pain, leg swelling.  Was in A-fib with heart rate 180s to 205 treated with Cardizem with rate reduction 100-150.  Room air sats  81%, 100% sats on 3 L nasal cannula.  No hypotension.  Hgb 9.8.  avg of 9.5 to 10.5 2 m ago.  MCV 112.  Platelets 135... 172 (no other recent thrombocytopenia).  WBCs 14.8, were 5.2 3 days ago. T bili, alk phos normal.  AST/ALT 150/45.  Albumin 2.8.   Mag low 1.  Lactate elevated 5.   ETOH 121, 312 3 d ago.  Trop I 33.. 28.  BNP 996.   B12, folate were normal in 02/2022 CXR: 26 mm (likely) coin at distal esophagus.  Interval, new pulmonary vascular engorgement centrally in keeping with early cardiogenic failure or volume overload.  Since arrival he is been confused, agitated, tremorous.  Temperature up to 100.7, sepsis protocol initiated Patient was able to swallow oral medications without difficulty according to his current RN.   Father died of a heart attack at 91 and suffered from dementia had had a stroke.  Sister has had some sort of cancer.  Past Medical History:  Diagnosis Date   Actinic keratosis 11/27/2016   Alcohol abuse with alcohol-induced mood disorder ([place]) 07/18/2018   Alcohol use disorder, severe, dependence ([place]) 09/16/2006   05/22/2015 Argumentative, belligerent and verbally abusive to staff per his note from [facility])  Anxiety state 04/25/2018   Chronic low back pain 09/16/2006   Followed by NS.  Per his chart from [place], he fell off two storyhouse roof while cleaning his brother's gutters and sustained compression fracture of L3, L4 and L5 and fracture of right transverse process at L3 and nondisplaced fracture of left glenoid rim many years ago. He had multiple imaging in [place] including Lumbar MRI in 2016 which showed moderate to severe spinal canal stenos   Delirium tremens ([place]) 09/01/2017   Depression    Dry eye 11/23/2016   Hiatal hernia 09/14/2016   Status Collis gastroplasty and Nissen's fundoplication on 16/60/6301 in [place]   History of delirium tremons 07/22/2020   DTs during 10/2016 08/2017. And 12/2017 admissions     History of pulmonary embolus (PE) 11/02/2016   Unprovoked 11/01/2016. Xarelto from 11/01/16 to 09/08/2017.   Hydrocele    Surgically corrected   Hypertension    Hypoalbuminemia due to protein-calorie malnutrition ([place]) 07/22/2020   Lumbar compression fracture ([facility])    Multiple rib fractures 07/22/2019   Left rib details XR: left ribs demonstrate multiple remote rib   Pancytopenia ([place])    Rhabdomyolysis 07/22/2020   Skin cancer    exciced 2017   Tinea versicolor 06/08/2013   Tobacco use disorder 07/22/2020   Tooth infection 04/25/2018   Trigger finger, acquired 11/18/2012   Ulnar tunnel syndrome of right wrist 11/08/2012    Past Surgical History:  Procedure Laterality Date   CATARACT EXTRACTION     HIATAL HERNIA REPAIR  2016   HYDROCELE EXCISION / REPAIR  2010   SKIN CANCER EXCISION Left    Excised 2017    Prior to Admission medications   Medication Sig Start Date End Date Taking? Authorizing Provider  acetaminophen (TYLENOL) 325 MG tablet Take 325-650 mg by mouth every 6 (six) hours as needed for headache (or pain). Patient not taking: Reported on 03/23/2022    [provider]  amiodarone (PACERONE) 200 MG tablet TAKE 1 TABLET (200 MG TOTAL) BY MOUTH DAILY. Patient taking differently: Take 200 mg by mouth daily. 03/25/22 03/25/23  Geradine Girt, DO  ascorbic acid (VITAMIN C) 500 MG tablet Take 500 mg by mouth daily as needed (for supplementation).    [provider]  B Complex-Folic Acid (B COMPLEX PLUS) TABS Take 1 tablet by mouth daily as needed (for supplementation).    [provider]  CALCIUM PO Take 1 tablet by mouth 2 (two) times a week.    [provider]  Cholecalciferol (VITAMIN D3 PO) Take 1 capsule by mouth daily as needed (for supplementation).    [provider]  ferrous sulfate 325 (65 FE) MG tablet Take 1 tablet (325 mg total) by mouth daily as needed (for supplementation). 03/19/22   Sharion Settler, DO  folic acid (FOLVITE) 1 MG  tablet Take 1 tablet (1 mg total) by mouth daily. 03/20/22   Sharion Settler, DO  furosemide (LASIX) 20 MG tablet TAKE 1 TABLET (20 MG TOTAL) BY MOUTH DAILY. Patient taking differently: Take 20 mg by mouth daily. 03/25/22 03/25/23  Geradine Girt, DO  loratadine (CLARITIN) 10 MG tablet TAKE 1 TABLET (10 MG TOTAL) BY MOUTH DAILY. Patient taking differently: Take 10 mg by mouth daily. 10/08/20 03/23/22  Lilland, Alana, DO  magnesium chloride (SLOW-MAG) 64 MG TBEC SR tablet Take 1 tablet (64 mg total) by mouth daily. 10/09/20   Lilland, Alana, DO  methocarbamol (ROBAXIN) 750 MG tablet Take 750 mg by mouth 4 (four)  times daily.    [provider]  metoprolol succinate (TOPROL-XL) 25 MG 24 hr tablet Take 1/2 tablet (12.5 mg total) by mouth daily. 03/25/22   Geradine Girt, DO  mometasone-formoterol (DULERA) 100-5 MCG/ACT AERO INHALE 2 PUFFS INTO THE LUNGS TWO TIMES DAILY. 03/19/22 03/19/23  Sharion Settler, DO  Multiple Vitamins-Minerals (CENTRUM SILVER 50+MEN) TABS Take 1 tablet by mouth daily with breakfast.    [provider]  rivaroxaban (XARELTO) 20 MG TABS tablet TAKE 1 TABLET (20 MG TOTAL) BY MOUTH DAILY WITH SUPPER. 03/25/22 03/25/23  Eulogio Bear U, DO  rosuvastatin (CRESTOR) 10 MG tablet TAKE 1 TABLET (10 MG TOTAL) BY MOUTH DAILY. 03/19/22 03/19/23  Sharion Settler, DO  sertraline (ZOLOFT) 50 MG tablet Take 50 mg by mouth daily.    [provider]  simethicone (GAS-X) 80 MG chewable tablet Chew 80-160 mg by mouth every 6 (six) hours as needed for flatulence.    [provider]  thiamine (VITAMIN B1) 100 MG tablet Take 1 tablet (100 mg total) by mouth daily. 03/20/22   Sharion Settler, DO    Scheduled Meds:  diltiazem  10 mg Intravenous Once   folic acid  1 mg Oral Daily   furosemide  40 mg Intravenous Once   [START ON 05/20/2022] metoprolol succinate  12.5 mg Oral Daily   multivitamin with minerals  1 tablet Oral Daily   rivaroxaban  20 mg Oral Q supper    rosuvastatin  10 mg Oral Daily   thiamine  100 mg Oral Daily   Infusions:  cefTRIAXone (ROCEPHIN)  IV     diltiazem (CARDIZEM) infusion     lactated ringers     [START ON 05/20/2022] magnesium sulfate bolus IVPB     magnesium sulfate bolus IVPB     PRN Meds: acetaminophen, LORazepam **OR** LORazepam, methocarbamol   Allergies as of 05/19/2022 - Review Complete 05/19/2022  Allergen Reaction Noted   Other Itching and Other (See Comments) 03/15/2022   Poison ivy extract Rash 03/23/2022    Family History  Problem Relation Age of Onset   Heart attack Father        died at 11 years   Dementia Father    Stroke Father    Cancer Sister    Colon cancer Neg Hx    Colon polyps Neg Hx    Esophageal cancer Neg Hx    Stomach cancer Neg Hx    Rectal cancer Neg Hx     Social History   Socioeconomic History   Marital status: Single    Spouse name: Not on file   Number of children: Not on file   Years of education: Not on file   Highest education level: Not on file  Occupational History   Occupation: unemployed  Tobacco Use   Smoking status: Never   Smokeless tobacco: Current    Types: Snuff   Tobacco comments:    Pt states do snuff when stressed had 1 can last 3 month//PL  Vaping Use   Vaping Use: Never used  Substance and Sexual Activity   Alcohol use: Yes    Alcohol/week: 43.0 standard drinks of alcohol    Types: 42 Cans of beer, 1 Standard drinks or equivalent per week    Comment: daily use-6 beers daily and whiskey when he has it.   Drug use: Yes    Types: Marijuana    Comment: occasional-none this year per pt   Sexual activity: Yes    Birth control/protection: None  Other Topics Concern   Not on file  Social History Narrative   Lives with a friend. Has children but hasn't met them in a while. He doesn't know where they live.    Social Determinants of Health   Financial Resource Strain: Not on file  Food Insecurity: Food Insecurity Present (03/24/2022)   Hunger  Vital Sign    Worried About Running Out of Food in the Last Year: Often true    Ran Out of Food in the Last Year: Often true  Transportation Needs: Unmet Transportation Needs (03/24/2022)   PRAPARE - Hydrologist (Medical): Yes    Lack of Transportation (Non-Medical): Yes  Physical Activity: Not on file  Stress: Not on file  Social Connections: Not on file  Intimate Partner Violence: Not At Risk (03/24/2022)   Humiliation, Afraid, Rape, and Kick questionnaire    Fear of Current or Ex-Partner: No    Emotionally Abused: No    Physically Abused: No    Sexually Abused: No    REVIEW OF SYSTEMS: Not able to obtain review of systems from the patient as he is sedated and not answering questions.    PHYSICAL EXAM: Vital signs in last 24 hours: Vitals:   05/19/22 0652 05/19/22 0735  BP: (!) 144/107 (!) 142/95  Pulse: (!) 171 (!) 159  Resp:  (!) 31  Temp:  (!) 100.7 F (38.2 C)  SpO2:  98%   Wt Readings from Last 3 Encounters:  05/19/22 64.5 kg  05/16/22 64.5 kg  03/25/22 64.5 kg    General: Sedated, just received Ativan for active withdrawal symptoms.  Disheveled.  Resting on the bed in no distress. Head: No signs of head trauma. Eyes: Conjunctiva pale.  No scleral icterus. Ears: Unable to assess hearing as he is sedated and not responding to voice, commands. Nose: No discharge Mouth: Limited views but poor dentition with caries.  Mucosa is pink, moist, clear. Neck: No JVD, no masses. Lungs: No labored breathing.  Lungs clear.  No labored breathing.  No cough Heart: Tachy, currently rate 132. Abdomen: Soft, no response to palpation.  No masses.  Bowel sounds active.  No distention..   Rectal: Deferred Musc/Skeltl: No obvious joint swelling, redness or deformity. Extremities: 2+ edema on the lower legs/feet, more prominent on left.  Mild erythema in the lower legs. Neurologic: Unresponsive to voice, name.  Did not applied noxious stimuli to wake him  up.  Turned himself from side to side in the bed.  No tremors observed. Skin: Changes of excessive sun exposure with prominent outer erythema on the hands and lower arms. Nodes: No cervical adenopathy Psych: Sedated, unresponsive to nonnoxious stimuli and exam.  Intake/Output from previous day: No intake/output data recorded. Intake/Output this shift: No intake/output data recorded.  LAB RESULTS: Recent Labs    05/19/22 0345  WBC 14.8*  HGB 9.8*  HCT 28.9*  PLT 172   BMET Lab Results  Component Value Date   NA 136 05/19/2022   NA 140 05/16/2022   NA 136 03/25/2022   K 4.1 05/19/2022   K 3.7 05/16/2022   K 3.5 03/25/2022   CL 106 05/19/2022   CL 107 05/16/2022   CL 103 03/25/2022   CO2 13 (L) 05/19/2022   CO2 20 (L) 05/16/2022   CO2 26 03/25/2022   GLUCOSE 80 05/19/2022   GLUCOSE 95 05/16/2022   GLUCOSE 101 (H) 03/25/2022   BUN 14 05/19/2022   BUN 10 05/16/2022  BUN 16 03/25/2022   CREATININE 0.89 05/19/2022   CREATININE 0.88 05/16/2022   CREATININE 0.93 03/25/2022   CALCIUM 7.8 (L) 05/19/2022   CALCIUM 8.1 (L) 05/16/2022   CALCIUM 8.2 (L) 03/25/2022   LFT Recent Labs    05/19/22 0345  PROT 6.6  ALBUMIN 2.8*  AST 150*  ALT 45*  ALKPHOS 56  BILITOT 0.7   PT/INR Lab Results  Component Value Date   INR 1.09 11/03/2016   Hepatitis Panel No results for input(s): "HEPBSAG", "HCVAB", "HEPAIGM", "HEPBIGM" in the last 72 hours. C-Diff No components found for: "CDIFF" Lipase     Component Value Date/Time   LIPASE 31 01/04/2018 1739    Drugs of Abuse     Component Value Date/Time   LABOPIA NONE DETECTED 05/18/2021 2230   COCAINSCRNUR NONE DETECTED 05/18/2021 2230   LABBENZ NONE DETECTED 05/18/2021 2230   AMPHETMU NONE DETECTED 05/18/2021 2230   THCU NONE DETECTED 05/18/2021 2230   LABBARB NONE DETECTED 05/18/2021 2230     RADIOLOGY STUDIES: DG Chest Port 1 View  Result Date: 05/19/2022 CLINICAL DATA:  Atrial fibrillation EXAM: PORTABLE  CHEST 1 VIEW COMPARISON:  05/16/2022 FINDINGS: Lungs are clear. No pneumothorax or pleural effusion. Metallic density compatible with a 26 mm coin is again seen in the expected location of the distal esophagus above the level the fundoplication noted on CT examination of 03/23/2022. Cardiac size within normal limits. Central pulmonary arteries demonstrate vascular engorgement, progressive since prior examination in keeping with changes of early cardiogenic failure or volume overload. No superimposed overt pulmonary edema. Healed right rib fracture noted. IMPRESSION: 1. Interval development of central pulmonary vascular engorgement in keeping with changes of early cardiogenic failure or volume overload. 2. 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 Electronically Signed   By: Fidela Salisbury M.D.   On: 05/19/2022 04:07      IMPRESSION:   What appears to be a coin in the distal esophagus, stable positioning since 10/28.  Per notes patient did not complain of dysphagia and was not able to recall having swallowed a coin.  Able to swallow meds without difficulty within the last hour.  A-fib, RVR.  Previously on Xarelto which has been restarted as of orders this morning.  Diltiazem drip in place.  CHF.  Diastolic  Sepsis.  Possible lower extremity cellulitis.  Possible pneumonia.  Vancomycin, Rocephin in place.  EtOH use disorder and dependence, chronic without remission.  Requiring Ativan for active withdrawal.  Elevated transaminases.  Suspect element of alcoholic hepatitis.  At latest liver imaging, ultrasound in 2018, there was fatty liver.    Macrocytic anemia.  Stable.  B12, folate ok in 02/2022.    PLAN:     Given patient is able to swallow probably best to delay endoscopy until tachycardia, heart failure have stabilized.  If alert enough he can have clear liquids.  Await PT/INR which have been ordered.  Ultrasound abdomen to assess what was  previously fatty liver.  Will order.  It would be best if Xarelto was discontinued and if anticoagulation required, go with IV heparin as this can be stopped just a few hours before endoscopic procedures.   Azucena Freed  05/19/2022, 8:13 AM Phone [phone]   Attending physician's note   I have taken history, reviewed the chart and examined the patient. I performed a substantive portion of this encounter, including complete performance of at least one of the key components, in conjunction with  the APP. I agree with the Advanced Practitioner's note, impression and recommendations.  X-rays reviewed.  FB esophagus (likely a coin) since 10/28 on CxR.  No dysphagia. Pt does not recall swallowing a coin.  A fib with RVR on Xeralto (last dose today) with dCHF exacerbation, currently on cardiazem drip/amiodarone.  Continued ETOH abuse with ETOH liver disease. At risk for liver cirrhosis. ETOH level 121 today. Care complicated by homelessness.  Plan: -Appreciate cardiology consult.   -Hold Xarelto and switch to heparin. -EGD once cleared by cardiology.  Tentatively tomorrow.  Hold heparin 6 hours before. -Check PT INR. Trend LFTs -Check [place] abdo -Watch for alcohol withdrawal   Carmell Austria, MD [facility] [phone]

## 2022-05-19 NOTE — Assessment & Plan Note (Deleted)
Acute on chronic pain. No neuro deficits on exam. Has been sleeping on benches outside which he says has exacerbated his pain. He usually takes a muscle relaxer and tylenol PM. Suspect pain 2/2 chronic compression fractures at L2-4.  -Tylenol, robaxin for pain -Can consider lidocaine patch and/or x-ray if not controlled -PT/OT

## 2022-05-19 NOTE — ED Notes (Signed)
Pt now sleeping; rise and fall of chest noted. Pt cleaned up and linens changed. Pt in restraints to prevent interference of care and for pt safety

## 2022-05-19 NOTE — Assessment & Plan Note (Deleted)
CXR shows likely coin in distal esophagus. Patient denies swallowing coin or other foreign body to his knowledge. - GI consulted, appreciate recommendations  - cont heparin  - planning EGD today

## 2022-05-19 NOTE — Progress Notes (Signed)
Spoke with Dr. Erin Fulling with PCCM about hx of multiple DTs and increased CIWA scores of 15 and 16. We would like to start phenobarbital taper and was wondering what recommendations/protocol.  He let me know to discuss with pharmacist to order phenobarbital taper and can have as needed Ativan alongside this.  He also discussed possibilities of either long acting benzos IV or oral valium throughout the day and prn benzos as well.  He said phenobarbital taper likely safest option as less respiratory depression.  He says if withdrawal worsening/needing Precedex or if any concerns other than this may contact PCCM again for full formal consult.  Appreciate recommendations from PCCM.  Discussed with our [facility] pharmacist has placed phenobarbital taper for this high risk patient.

## 2022-05-19 NOTE — Assessment & Plan Note (Addendum)
Is outside of withdrawal window. - TOC help with resources for maintaining sobriety  - Thiamine, folate, MVI

## 2022-05-19 NOTE — Progress Notes (Addendum)
FMTS Brief Progress Note  S: Patient seen at bedside for evening rounds. Dr. Marcha Dutton and RN team (both day and night nurse) present as well. Patient does not provide any meaningful history.   O: BP 111/77 (BP Location: Right Arm)  Pulse (!) 156   Temp 99.5 F (37.5 C) (Oral)   Resp (!) 32   Ht '5\' 10"'$  (1.778 m)   Wt 64.5 kg   SpO2 96%   BMI 20.40 kg/m   Gen: alert, uncomfortable appearing HEENT: PERRL CV: irregularly irregular rhythm, tachycardic Resp: normal effort, lungs CTA anteriorly GI: abd soft, NTND Ext: increased warmth L lower extremity with slight erythema distal shin, patient grimaces with palpation of L anterior lower leg Neuro: oriented to self only, otherwise with nonsensical answers to orientation questions, mildly tremulous, moves all extremities equally  A/P: Acute Alcohol Withdrawal Concern for DTs. Most recent CIWA 51.  -Additional '1mg'$  Ativan now -Continue phenobarb taper with '1mg'$  Ativan q4h prn -Low threshold for CCM consult/precedex gtt  A-Fib with RVR Persistent RVR with rates as high as 150s, although most recent HR 109. -Continue amio gtt -Tele monitoring -Continue treatment of underlying sepsis, alcohol withdrawal, electrolyte derangements  Hypomagnesemia Repeat Mg pending  Remainder per day team note.  - Orders reviewed. Labs for AM ordered, which was adjusted as needed.  - If condition changes, plan includes CCM consult, consider imaging of back and/or L lower extremity.   [name], MD 05/19/2022, 8:33 PM PGY-3, [facility] Night Resident  Please page [phone] with questions.

## 2022-05-19 NOTE — ED Notes (Signed)
Notified admitting team about pt CIWA, ativan being given and pt HR

## 2022-05-19 NOTE — Hospital Course (Addendum)
Anthony [name] is a 66 year old male who presents to the ED with low back pain and shortness of breath.  Pertinent past medical history includes heart failure, alcohol use disorder, atrial fibrillation, poor social situation, hypertension.  His hospital course is outlined below.  Atrial fibrillation with RVR ([place]) Patient has been without home medications.  He was started on diltiazem drip, however blood pressure was unable to tolerate.  Switched to amiodarone drip and eventually converted to p.o. amiodarone.  Cardiology was consulted and recommended Amiodarone orally, '400mg'$  BID x 5 days, '200mg'$  BID x7 days then '200mg'$  daily; low-dose beta-blocker; and Xarelto 20 mg daily. Through shared decision making with patient, determined we should discontinue anticoagulation due to patients high risk of falls and alcohol abuse. At time of discharge, pt was rate controlled and will continue Amiodarone '200mg'$  daily and Metop succinate 12.'5mg'$  daily.  Sepsis 2/2 Pneumonia  Fever to 100.7. Blood and urine cultures collected and showed no growth.  CXR showing possible pneumonia.  LA elevated to 5 (which trended down to 1.8 with initiation of maintenance fluids).  WBC elevated to 14.8.  Patient presented with erythema of left lower extremity and concern for cellulitis (DVT ultrasound was negative). Vancomycin and ceftriaxone were started for broad-spectrum coverage.  Patient completed antibiotic course of azithromycin and Rocephin for pneumonia with stabilization of vital signs and white count.  Medically stable at discharge with no signs of acute infection.  Acute on chronic diastolic CHF [facility]) Patient had +2 pitting bilateral lower extremity edema.  Pulmonary edema on chest x-ray.  BNP elevated at 996. Initially was started on maintenance fluids for sepsis.  Patient remained euvolemic. With soft blood pressures, patient was not restarted on diuretics.   Alcohol use disorder, severe dependence ([place]) Last drink was morning  prior to admission.  Ethanol level was 121.  History of multiple delirium tremens. Patient was placed on phenobarbital and Librium taper due to history of DT and current comorbid conditions.  He is was successfully weaned off alcohol and completed Librium and phenobarbital taper.  Medically stable at discharge with no signs of acute alcohol withdrawal.  Back pain Acute on chronic pain with no neurologic deficits.  Patient has unfortunately been sleeping on pensions of side due to housing insecurity.  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  Patient was given Tylenol and Robaxin for pain which improved his back pain.  PT/OT recommended SNF.  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  Stable on repeat x-ray.  GI was consulted and recommended endoscopy and  successfully removed the quarter.  Hyponatremia Developed hyponatremia during admission. Per nursing, pt was drinking a lot of decaf coffee. Na improved with 1500 ml/day fluid restriction.   Hypomagnesemia Noted to have hypomagnesemia that required frequent repletion during this admission. Most likely due to malnutrition. Will start a Mag supplement to continue on discharge.

## 2022-05-19 NOTE — Assessment & Plan Note (Addendum)
Euvolemic on exam today. - consider restarting home lasix 20 mg BID if signs of fluid overload - Continue home metoprolol 12.5 mg daily - Strict I/Os, daily weights

## 2022-05-19 NOTE — Progress Notes (Signed)
FMTS Interim Progress Note  S: I evaluated the patient at bedside, attending Dr. McDiarmid present.  Patient is asleep s/p 3 mg Ativan.  He is arousable by verbal stimuli, but falls asleep fairly quickly.  He denies being in pain at this time.  O: BP (!) 144/107   Pulse (!) 171   Temp 98.1 F (36.7 C) (Oral)   Resp (!) 26   Ht '5\' 10"'$  (1.778 m)   Wt 64.5 kg   SpO2 97%   BMI 20.40 kg/m   General: Sleepy but arousable Cardio: Irregular rate and rhythm, no murmurs Respiratory: Clear to auscultation anteriorly and laterally.  Normal work of breathing on room air GI: Patient does not wake from sleep when palpating abdomen. Skin: Erythematous rash on left lower extremity, warm-concern for cellulitis.  Patient wakes from sleep with firm pressure over rash. Extremities: +2 pitting edema in bilateral lower extremities.  A/P: A-fib RVR Heart rate between 140-150 in room.  Irregular rhythm.  S/p p.o. amnio and metoprolol. - Start diltiazem drip 10 mg - Taper drip based on blood pressure - If he is not able to tolerate, consider switching to amnio drip - Consider cardiology consult  Cellulitis sepsis Left lower extremity with erythema, warm to touch.  Firm pressure wakes patient from sleep and pain.  Erythema extends from ankle to mid tibia-no drainage present.  Patient with leukocytosis and elevated heart rate, potential source, meeting SIRS criteria. - Obtain blood cultures, prior to ABX - Start Vanc/CTX IV -We will start maintenance fluids at 100 mL/hour, need to be careful with concomitant acute decompensated CHF.  Foreign body aspiration distal esophagus Currently asymptomatic, originally noted by CXR on 10/28.  CXR today shows stable location.  -We will obtain a GI consult for further recommendations  Leslie Dales, DO 05/19/2022, 7:21 AM PGY-1, [facility] pager [phone]

## 2022-05-19 NOTE — ED Provider Notes (Addendum)
[facility] EMERGENCY DEPARTMENT Provider Note   CSN: [id number] Arrival date & time: 05/19/22  0330     History  Chief Complaint  Patient presents with   Shortness of Breath    Anthony [name] is a 66 y.o. male.  The history is provided by the patient and medical records.  Shortness of Breath  66 year old male with history of hypertension, history of PE, alcoholism, depression, A-fib, presenting to the ED with A-fib RVR.  Patient is currently homeless, called EMS from the street as he was having back pain.  This is a chronic issue for him following a fall 20+ years ago.  He denies any new falls/trauma.  Upon assessment, found to be AFIB w/RVR rate 150's-220's, sats low 80's on RA.  He was given '10mg'$  IV cardizem which slowed him down to around 140's.  Patient is non-compliant with medications-- unable to state last time he took his home meds.  Home Medications Prior to Admission medications   Medication Sig Start Date End Date Taking? Authorizing Provider  acetaminophen (TYLENOL) 325 MG tablet Take 325-650 mg by mouth every 6 (six) hours as needed for headache (or pain). Patient not taking: Reported on 03/23/2022    [provider]  amiodarone (PACERONE) 200 MG tablet TAKE 1 TABLET (200 MG TOTAL) BY MOUTH DAILY. 03/25/22 03/25/23  Geradine Girt, DO  ascorbic acid (VITAMIN C) 500 MG tablet Take 500 mg by mouth daily as needed (for supplementation).    [provider]  B Complex-Folic Acid (B COMPLEX PLUS) TABS Take 1 tablet by mouth daily as needed (for supplementation).    [provider]  CALCIUM PO Take 1 tablet by mouth 2 (two) times a week.    [provider]  Cholecalciferol (VITAMIN D3 PO) Take 1 capsule by mouth daily as needed (for supplementation).    [provider]  ferrous sulfate 325 (65 FE) MG tablet Take 1 tablet (325 mg total) by mouth daily as needed (for supplementation). 03/19/22   Sharion Settler, DO   folic acid (FOLVITE) 1 MG tablet Take 1 tablet (1 mg total) by mouth daily. Patient not taking: Reported on 03/23/2022 03/20/22   Sharion Settler, DO  furosemide (LASIX) 20 MG tablet TAKE 1 TABLET (20 MG TOTAL) BY MOUTH DAILY. 03/25/22 03/25/23  Geradine Girt, DO  loratadine (CLARITIN) 10 MG tablet TAKE 1 TABLET (10 MG TOTAL) BY MOUTH DAILY. Patient taking differently: Take 10 mg by mouth daily. 10/08/20 03/23/22  Lilland, Alana, DO  magnesium chloride (SLOW-MAG) 64 MG TBEC SR tablet Take 1 tablet (64 mg total) by mouth daily. 10/09/20   Lilland, Alana, DO  methocarbamol (ROBAXIN) 750 MG tablet Take 750 mg by mouth 4 (four) times daily. Patient not taking: Reported on 03/23/2022    [provider]  metoprolol succinate (TOPROL-XL) 25 MG 24 hr tablet Take 1/2 tablet (12.5 mg total) by mouth daily. 03/25/22   Geradine Girt, DO  mometasone-formoterol (DULERA) 100-5 MCG/ACT AERO INHALE 2 PUFFS INTO THE LUNGS TWO TIMES DAILY. Patient not taking: Reported on 03/23/2022 03/19/22 03/19/23  Sharion Settler, DO  Multiple Vitamins-Minerals (CENTRUM SILVER 50+MEN) TABS Take 1 tablet by mouth daily with breakfast.    [provider]  Multiple Vitamins-Minerals (ZINC PO) Take 1 tablet by mouth daily as needed (for supplementation).    [provider]  rivaroxaban (XARELTO) 20 MG TABS tablet TAKE 1 TABLET (20 MG TOTAL) BY MOUTH DAILY WITH SUPPER. 03/25/22 03/25/23  Eulogio Bear  U, DO  rosuvastatin (CRESTOR) 10 MG tablet TAKE 1 TABLET (10 MG TOTAL) BY MOUTH DAILY. Patient not taking: Reported on 03/23/2022 03/19/22 03/19/23  Sharion Settler, DO  sertraline (ZOLOFT) 50 MG tablet Take 50 mg by mouth daily. Patient not taking: Reported on 03/23/2022    [provider]  simethicone (GAS-X) 80 MG chewable tablet Chew 80-160 mg by mouth every 6 (six) hours as needed for flatulence. Patient not taking: Reported on 03/23/2022    [provider]  thiamine (VITAMIN B1) 100 MG tablet Take 1  tablet (100 mg total) by mouth daily. Patient not taking: Reported on 03/23/2022 03/20/22   Sharion Settler, DO      Allergies    Other and Poison ivy extract    Review of Systems   Review of Systems  Respiratory:  Positive for shortness of breath.   All other systems reviewed and are negative.   Physical Exam Updated Vital Signs BP (!) 128/90   Pulse (!) 129   Temp 99.6 F (37.6 C) (Oral)   Resp 16   Ht '5\' 10"'$  (1.778 m)   Wt 64.5 kg   SpO2 99%   BMI 20.40 kg/m   Physical Exam Vitals and nursing note reviewed.  Constitutional:      Appearance: He is well-developed.     Comments: Disheveled appearing  HENT:     Head: Normocephalic and atraumatic.  Eyes:     Conjunctiva/sclera: Conjunctivae normal.     Pupils: Pupils are equal, round, and reactive to light.  Cardiovascular:     Rate and Rhythm: Tachycardia present. Rhythm irregularly irregular.     Heart sounds: Normal heart sounds.     Comments: AFIB, rate 130's-140's on exam Pulmonary:     Effort: Pulmonary effort is normal.     Breath sounds: Normal breath sounds. No wheezing or rhonchi.  Abdominal:     General: Bowel sounds are normal.     Palpations: Abdomen is soft.  Musculoskeletal:        General: Normal range of motion.     Cervical back: Normal range of motion.  Skin:    General: Skin is warm and dry.     Comments: 1+ pitting edema BLE  Neurological:     Mental Status: He is alert and oriented to person, place, and time.     ED Results / Procedures / Treatments   Labs (all labs ordered are listed, but only abnormal results are displayed) Labs Reviewed  CBC WITH DIFFERENTIAL/PLATELET - Abnormal; Notable for the following components:      Result Value   WBC 14.8 (*)    RBC 2.58 (*)    Hemoglobin 9.8 (*)    HCT 28.9 (*)    MCV 112.0 (*)    MCH 38.0 (*)    RDW 17.9 (*)    Neutro Abs 13.8 (*)    Lymphs Abs 0.3 (*)    Abs Immature Granulocytes 0.40 (*)    All other components within normal  limits  COMPREHENSIVE METABOLIC PANEL - Abnormal; Notable for the following components:   CO2 13 (*)    Calcium 7.8 (*)    Albumin 2.8 (*)    AST 150 (*)    ALT 45 (*)    Anion gap 17 (*)    All other components within normal limits  ETHANOL - Abnormal; Notable for the following components:   Alcohol, Ethyl (B) 121 (*)    All other components within normal limits  BRAIN  NATRIURETIC PEPTIDE - Abnormal; Notable for the following components:   B Natriuretic Peptide 996.8 (*)    All other components within normal limits  TROPONIN I (HIGH SENSITIVITY) - Abnormal; Notable for the following components:   Troponin I (High Sensitivity) 33 (*)    All other components within normal limits  T4, FREE  RAPID URINE DRUG SCREEN, HOSP PERFORMED  TSH  TROPONIN I (HIGH SENSITIVITY)    EKG EKG Interpretation  Date/Time:  Tuesday May 19 2022 04:16:37 EDT Ventricular Rate:  136 PR Interval:    QRS Duration: 88 QT Interval:  312 QTC Calculation: 470 R Axis:   86 Text Interpretation: Atrial fibrillation Multiple ventricular premature complexes Borderline right axis deviation Interpretation limited secondary to artifact Confirmed by Ripley Fraise ([id number]) on 05/19/2022 4:25:02 AM  Radiology DG Chest Port 1 View  Result Date: 05/19/2022 CLINICAL DATA:  Atrial fibrillation EXAM: PORTABLE CHEST 1 VIEW COMPARISON:  05/16/2022 FINDINGS: Lungs are clear. No pneumothorax or pleural effusion. Metallic density compatible with a 26 mm coin is again seen in the expected location of the distal esophagus above the level the fundoplication noted on CT examination of 03/23/2022. Cardiac size within normal limits. Central pulmonary arteries demonstrate vascular engorgement, progressive since prior examination in keeping with changes of early cardiogenic failure or volume overload. No superimposed overt pulmonary edema. Healed right rib fracture noted. IMPRESSION: 1. Interval development of central pulmonary  vascular engorgement in keeping with changes of early cardiogenic failure or volume overload. 2. 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 Electronically Signed   By: Fidela Salisbury M.D.   On: 05/19/2022 04:07    Procedures Procedures    CRITICAL CARE Performed by: Larene Pickett   Total critical care time: 35 minutes  Critical care time was exclusive of separately billable procedures and treating other patients.  Critical care was necessary to treat or prevent imminent or life-threatening deterioration.  Critical care was time spent personally by me on the following activities: development of treatment plan with patient and/or surrogate as well as nursing, discussions with consultants, evaluation of patient's response to treatment, examination of patient, obtaining history from patient or surrogate, ordering and performing treatments and interventions, ordering and review of laboratory studies, ordering and review of radiographic studies, pulse oximetry and re-evaluation of patient's condition.  This patients CHA2DS2-VASc Score and unadjusted Ischemic Stroke Rate (% per year) is equal to 4.8 % stroke rate/year from a score of 4  Above score calculated as 1 point each if present [CHF, HTN, DM, Vascular=MI/PAD/Aortic Plaque, Age if 65-74, or Male] Above score calculated as 2 points each if present [Age > 75, or Stroke/TIA/TE]   Medications Ordered in ED Medications  furosemide (LASIX) injection 40 mg (40 mg Intravenous Given 05/19/22 0527)  amiodarone (PACERONE) tablet 200 mg (200 mg Oral Given 05/19/22 0528)  metoprolol succinate (TOPROL-XL) 24 hr tablet 12.5 mg (12.5 mg Oral Given 05/19/22 0529)  rivaroxaban (XARELTO) tablet 20 mg (20 mg Oral Given 05/19/22 6568)    ED Course/ Medical Decision Making/ A&P                           Medical Decision Making Amount and/or Complexity of Data Reviewed Labs: ordered. Radiology: ordered  and independent interpretation performed. ECG/medicine tests: ordered and independent interpretation performed.  Risk Prescription drug management. Decision regarding hospitalization.   66 year old male presenting to the ED with A-fib RVR.  Has history of same, has been noncompliant with his medications.  Also reports chronic back pain.  Patient is disheveled on exam, does appear to have some peripheral edema.  Heart rate in the 140s, still in A-fib rhythm.  BP is stable in the 381W systolic.  Will check labs, CXR.    Labs as above--mild leukocytosis.  LFTs elevated, appears to be chronic and likely from his alcohol abuse.  BNP 996.  Troponin is 33, likely some demand ischemia from CHF.  Chest x-ray with findings of vascular congestion.  Also has a coin in distal esophagus, this was noted a few days ago as well.  He is not having any vomiting or airway compromise. BP soft in 299'B systolic.  He is not in any acute distress.  Suspect large portion of his symptoms due to medication noncompliance.  He cannot tell me the last time he took his home medications.  Will give dose of his home Toprol XL, amiodarone, and xarelto along with dose of IV lasix.  He will require admission once again.  Discussed with family practice teaching service-- they will admit for ongoing care.  Final Clinical Impression(s) / ED Diagnoses Final diagnoses:  None    Rx / DC Orders ED Discharge Orders     None         Larene Pickett, PA-C 05/19/22 M[place]    Larene Pickett, PA-C 05/19/22 0544    Ripley Fraise, MD 05/19/22 [phone]

## 2022-05-20 ENCOUNTER — Inpatient Hospital Stay (HOSPITAL_COMMUNITY): Payer: 59

## 2022-05-20 DIAGNOSIS — W44E2XA Non-magnetic metal coin entering into or through a natural orifice, initial encounter: Secondary | ICD-10-CM | POA: Diagnosis present

## 2022-05-20 DIAGNOSIS — Z5902 Unsheltered homelessness: Secondary | ICD-10-CM | POA: Diagnosis not present

## 2022-05-20 DIAGNOSIS — Z91148 Patient's other noncompliance with medication regimen for other reason: Secondary | ICD-10-CM | POA: Diagnosis not present

## 2022-05-20 DIAGNOSIS — F1024 Alcohol dependence with alcohol-induced mood disorder: Secondary | ICD-10-CM | POA: Diagnosis not present

## 2022-05-20 DIAGNOSIS — E876 Hypokalemia: Secondary | ICD-10-CM | POA: Diagnosis not present

## 2022-05-20 DIAGNOSIS — A419 Sepsis, unspecified organism: Secondary | ICD-10-CM | POA: Diagnosis not present

## 2022-05-20 DIAGNOSIS — Y906 Blood alcohol level of 120-199 mg/100 ml: Secondary | ICD-10-CM | POA: Diagnosis present

## 2022-05-20 DIAGNOSIS — L03116 Cellulitis of left lower limb: Secondary | ICD-10-CM | POA: Diagnosis present

## 2022-05-20 DIAGNOSIS — Z1152 Encounter for screening for COVID-19: Secondary | ICD-10-CM | POA: Diagnosis not present

## 2022-05-20 DIAGNOSIS — R195 Other fecal abnormalities: Secondary | ICD-10-CM

## 2022-05-20 DIAGNOSIS — E8721 Acute metabolic acidosis: Secondary | ICD-10-CM | POA: Diagnosis not present

## 2022-05-20 DIAGNOSIS — I5033 Acute on chronic diastolic (congestive) heart failure: Secondary | ICD-10-CM | POA: Diagnosis not present

## 2022-05-20 DIAGNOSIS — I509 Heart failure, unspecified: Secondary | ICD-10-CM

## 2022-05-20 DIAGNOSIS — T18108A Unspecified foreign body in esophagus causing other injury, initial encounter: Secondary | ICD-10-CM | POA: Diagnosis not present

## 2022-05-20 DIAGNOSIS — I480 Paroxysmal atrial fibrillation: Secondary | ICD-10-CM | POA: Diagnosis not present

## 2022-05-20 DIAGNOSIS — J189 Pneumonia, unspecified organism: Secondary | ICD-10-CM | POA: Diagnosis not present

## 2022-05-20 DIAGNOSIS — F10931 Alcohol use, unspecified with withdrawal delirium: Secondary | ICD-10-CM | POA: Diagnosis not present

## 2022-05-20 DIAGNOSIS — K449 Diaphragmatic hernia without obstruction or gangrene: Secondary | ICD-10-CM | POA: Diagnosis present

## 2022-05-20 DIAGNOSIS — E440 Moderate protein-calorie malnutrition: Secondary | ICD-10-CM | POA: Diagnosis not present

## 2022-05-20 DIAGNOSIS — G8929 Other chronic pain: Secondary | ICD-10-CM | POA: Diagnosis present

## 2022-05-20 DIAGNOSIS — M4856XA Collapsed vertebra, not elsewhere classified, lumbar region, initial encounter for fracture: Secondary | ICD-10-CM | POA: Diagnosis present

## 2022-05-20 DIAGNOSIS — R410 Disorientation, unspecified: Secondary | ICD-10-CM | POA: Diagnosis not present

## 2022-05-20 DIAGNOSIS — K315 Obstruction of duodenum: Secondary | ICD-10-CM | POA: Diagnosis not present

## 2022-05-20 DIAGNOSIS — R0602 Shortness of breath: Secondary | ICD-10-CM | POA: Diagnosis present

## 2022-05-20 DIAGNOSIS — I110 Hypertensive heart disease with heart failure: Secondary | ICD-10-CM | POA: Diagnosis not present

## 2022-05-20 DIAGNOSIS — I4891 Unspecified atrial fibrillation: Secondary | ICD-10-CM | POA: Diagnosis present

## 2022-05-20 DIAGNOSIS — K2100 Gastro-esophageal reflux disease with esophagitis, without bleeding: Secondary | ICD-10-CM | POA: Diagnosis not present

## 2022-05-20 DIAGNOSIS — K2990 Gastroduodenitis, unspecified, without bleeding: Secondary | ICD-10-CM | POA: Diagnosis present

## 2022-05-20 DIAGNOSIS — K7010 Alcoholic hepatitis without ascites: Secondary | ICD-10-CM | POA: Diagnosis not present

## 2022-05-20 DIAGNOSIS — E871 Hypo-osmolality and hyponatremia: Secondary | ICD-10-CM | POA: Diagnosis not present

## 2022-05-20 DIAGNOSIS — R6520 Severe sepsis without septic shock: Secondary | ICD-10-CM | POA: Diagnosis not present

## 2022-05-20 DIAGNOSIS — G9341 Metabolic encephalopathy: Secondary | ICD-10-CM | POA: Diagnosis not present

## 2022-05-20 DIAGNOSIS — K222 Esophageal obstruction: Secondary | ICD-10-CM | POA: Diagnosis present

## 2022-05-20 DIAGNOSIS — K2970 Gastritis, unspecified, without bleeding: Secondary | ICD-10-CM | POA: Diagnosis present

## 2022-05-20 DIAGNOSIS — F10231 Alcohol dependence with withdrawal delirium: Secondary | ICD-10-CM | POA: Diagnosis not present

## 2022-05-20 DIAGNOSIS — D539 Nutritional anemia, unspecified: Secondary | ICD-10-CM | POA: Diagnosis not present

## 2022-05-20 DIAGNOSIS — T18198A Other foreign object in esophagus causing other injury, initial encounter: Secondary | ICD-10-CM | POA: Diagnosis present

## 2022-05-20 DIAGNOSIS — F1020 Alcohol dependence, uncomplicated: Secondary | ICD-10-CM | POA: Diagnosis not present

## 2022-05-20 LAB — COMPREHENSIVE METABOLIC PANEL
ALT: 45 U/L — ABNORMAL HIGH (ref 0–44)
AST: 171 U/L — ABNORMAL HIGH (ref 15–41)
Albumin: 2.2 g/dL — ABNORMAL LOW (ref 3.5–5.0)
Alkaline Phosphatase: 67 U/L (ref 38–126)
Anion gap: 9 (ref 5–15)
BUN: 20 mg/dL (ref 8–23)
CO2: 22 mmol/L (ref 22–32)
Calcium: 7.5 mg/dL — ABNORMAL LOW (ref 8.9–10.3)
Chloride: 103 mmol/L (ref 98–111)
Creatinine, Ser: 0.8 mg/dL (ref 0.61–1.24)
GFR, Estimated: >60 mL/min (ref >60)
Glucose, Bld: 118 mg/dL — ABNORMAL HIGH (ref 70–99)
Potassium: 3.1 mmol/L — ABNORMAL LOW (ref 3.5–5.1)
Sodium: 134 mmol/L — ABNORMAL LOW (ref 135–145)
Total Bilirubin: 0.6 mg/dL (ref 0.3–1.2)
Total Protein: 5.5 g/dL — ABNORMAL LOW (ref 6.5–8.1)

## 2022-05-20 LAB — URINE CULTURE: Culture: NO GROWTH

## 2022-05-20 LAB — CBC
HCT: 25.3 % — ABNORMAL LOW (ref 39.0–52.0)
Hemoglobin: 8.7 g/dL — ABNORMAL LOW (ref 13.0–17.0)
MCH: 36.4 pg — ABNORMAL HIGH (ref 26.0–34.0)
MCHC: 34.4 g/dL (ref 30.0–36.0)
MCV: 105.9 fL — ABNORMAL HIGH (ref 80.0–100.0)
Platelets: 141 10*3/uL — ABNORMAL LOW (ref 150–400)
RBC: 2.39 MIL/uL — ABNORMAL LOW (ref 4.22–5.81)
RDW: 17.4 % — ABNORMAL HIGH (ref 11.5–15.5)
WBC: 11.1 10*3/uL — ABNORMAL HIGH (ref 4.0–10.5)
nRBC: 0 % (ref 0.0–0.2)

## 2022-05-20 LAB — BASIC METABOLIC PANEL
Anion gap: 12 (ref 5–15)
BUN: 20 mg/dL (ref 8–23)
CO2: 21 mmol/L — ABNORMAL LOW (ref 22–32)
Calcium: 7.7 mg/dL — ABNORMAL LOW (ref 8.9–10.3)
Chloride: 100 mmol/L (ref 98–111)
Creatinine, Ser: 0.74 mg/dL (ref 0.61–1.24)
GFR, Estimated: >60 mL/min (ref >60)
Glucose, Bld: 96 mg/dL (ref 70–99)
Potassium: 3.5 mmol/L (ref 3.5–5.1)
Sodium: 133 mmol/L — ABNORMAL LOW (ref 135–145)

## 2022-05-20 LAB — HEPARIN LEVEL (UNFRACTIONATED): Heparin Unfractionated: >1.1 IU/mL — ABNORMAL HIGH (ref 0.30–0.70)

## 2022-05-20 LAB — APTT
aPTT: 54 seconds — ABNORMAL HIGH (ref 24–36)
aPTT: 74 seconds — ABNORMAL HIGH (ref 24–36)

## 2022-05-20 LAB — MAGNESIUM: Magnesium: 1.8 mg/dL (ref 1.7–2.4)

## 2022-05-20 MED ORDER — AZITHROMYCIN 250 MG PO TABS
500.0000 mg | ORAL_TABLET | Freq: Every day | ORAL | Status: DC
  Administered 2022-05-20 – 2022-05-22 (×3): 500 mg via ORAL
  Filled 2022-05-20 (×3): qty 2

## 2022-05-20 MED ORDER — IOHEXOL 350 MG/ML SOLN
75.0000 mL | Freq: Once | INTRAVENOUS | Status: AC | PRN
  Administered 2022-05-20: 75 mL via INTRAVENOUS

## 2022-05-20 MED ORDER — CHLORDIAZEPOXIDE HCL 25 MG PO CAPS
25.0000 mg | ORAL_CAPSULE | Freq: Four times a day (QID) | ORAL | Status: AC
  Administered 2022-05-20 (×3): 25 mg via ORAL
  Filled 2022-05-20: qty 1
  Filled 2022-05-20 (×2): qty 5
  Filled 2022-05-20: qty 1
  Filled 2022-05-20: qty 5

## 2022-05-20 MED ORDER — MAGNESIUM SULFATE 2 GM/50ML IV SOLN
2.0000 g | Freq: Once | INTRAVENOUS | Status: AC
  Administered 2022-05-20: 2 g via INTRAVENOUS
  Filled 2022-05-20: qty 50

## 2022-05-20 MED ORDER — VITAMIN K1 10 MG/ML IJ SOLN
5.0000 mg | Freq: Every day | INTRAVENOUS | Status: DC
  Filled 2022-05-20: qty 0.5

## 2022-05-20 MED ORDER — CHLORDIAZEPOXIDE HCL 25 MG PO CAPS
25.0000 mg | ORAL_CAPSULE | Freq: Three times a day (TID) | ORAL | Status: AC
  Administered 2022-05-21 (×2): 25 mg via ORAL
  Filled 2022-05-20 (×3): qty 1

## 2022-05-20 MED ORDER — ENSURE ENLIVE PO LIQD
237.0000 mL | Freq: Three times a day (TID) | ORAL | Status: DC
  Administered 2022-05-21 – 2022-06-02 (×25): 237 mL via ORAL

## 2022-05-20 MED ORDER — IOHEXOL 9 MG/ML PO SOLN
500.0000 mL | ORAL | Status: AC
  Administered 2022-05-20 (×2): 500 mL via ORAL

## 2022-05-20 MED ORDER — POTASSIUM CHLORIDE CRYS ER 20 MEQ PO TBCR
40.0000 meq | EXTENDED_RELEASE_TABLET | ORAL | Status: AC
  Administered 2022-05-20 (×2): 40 meq via ORAL
  Filled 2022-05-20 (×2): qty 2

## 2022-05-20 MED ORDER — CHLORDIAZEPOXIDE HCL 25 MG PO CAPS
25.0000 mg | ORAL_CAPSULE | ORAL | Status: AC
  Administered 2022-05-20 (×2): 25 mg via ORAL

## 2022-05-20 MED ORDER — VITAMIN K1 10 MG/ML IJ SOLN
10.0000 mg | Freq: Every day | INTRAVENOUS | Status: AC
  Administered 2022-05-20 – 2022-05-22 (×2): 10 mg via INTRAVENOUS
  Filled 2022-05-20 (×3): qty 1

## 2022-05-20 MED ORDER — CHLORDIAZEPOXIDE HCL 25 MG PO CAPS
25.0000 mg | ORAL_CAPSULE | Freq: Every day | ORAL | Status: AC
  Administered 2022-05-23: 25 mg via ORAL
  Filled 2022-05-20 (×2): qty 1

## 2022-05-20 NOTE — ED Notes (Signed)
Patient had another loose BM. Provided with perineal care and fresh diaper and sheets.

## 2022-05-20 NOTE — Assessment & Plan Note (Deleted)
Resolved.  S/p antibiotics.  Labs, VS, and imaging stable.   - s/p Rocephin (10/31 - 11/4) - s/p Azithromycin (11/1 - 11/3) - monitor fever curve, if temp >100.4 re-culture

## 2022-05-20 NOTE — H&P (View-Only) (Signed)
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   He again denies knowledge of having swallowed anything coin like.  Not having any chest pain, odynophagia, dysphagia.  No shortness of breath.  OBJECTIVE:         Vital signs in last 24 hours:    Temp:  [97.5 F (36.4 C)-100.4 F (38 C)] 97.9 F (36.6 C) (11/01 1001) Pulse Rate:  [84-156] 88 (11/01 1001) Resp:  [19-42] 19 (11/01 1001) BP: (102-182)/(61-161) 113/82 (11/01 1001) SpO2:  [81 %-100 %] 94 % (11/01 0755) Weight:  [68.3 kg] 68.3 kg (11/01 0424) Last BM Date : 05/20/22 Filed Weights   05/19/22 0336 05/20/22 0005 05/20/22 0424  Weight: 64.5 kg 68.3 kg 68.3 kg   General: Patient is more alert today.  He is able to answer questions appropriately Heart: Telemetry with sinus rhythm in the 90s. Chest: No labored breathing.  No cough.  No tenderness on palpation of sternal region. Abdomen: Soft without tenderness.  Active bowel sounds. Extremities: No CCE. Neuro/Psych: Pleasant.  Answers questions appropriately but quite slowly.  Stays focused.  Has soft wrist restraints in place but currently not acutely agitated.  Intake/Output from previous day: 10/31 0701 - 11/01 0700 In: 1571.2 [I.V.:952.6; IV Piggyback:618.6] Out: 1000 [Urine:1000]  Intake/Output this shift: No intake/output data recorded.  Lab Results: Recent Labs    05/19/22 0345 05/20/22 0310  WBC 14.8* 11.1*  HGB 9.8* 8.7*  HCT 28.9* 25.3*  PLT 172 141*   BMET Recent Labs    05/19/22 0345 05/20/22 0310  NA 136 134*  K 4.1 3.1*  CL 106 103  CO2 13* 22  GLUCOSE 80 118*  BUN 14 20  CREATININE 0.89 0.80  CALCIUM 7.8* 7.5*   LFT Recent Labs    05/19/22 0345 05/20/22 0310  PROT 6.6 5.5*  ALBUMIN 2.8* 2.2*  AST 150* 171*  ALT 45* 45*  ALKPHOS 56 67  BILITOT 0.7  0.6   PT/INR Recent Labs    05/19/22 1103  LABPROT 35.3*  INR 3.6*   Hepatitis Panel No results for input(s): "HEPBSAG", "HCVAB", "HEPAIGM", "HEPBIGM" in the last 72 hours.  Studies/Results: VAS [place] LOWER EXTREMITY VENOUS (DVT)  Result Date: 05/19/2022 Summary: LEFT: - There is no evidence of deep vein thrombosis in the lower extremity.  - No cystic structure found in the popliteal fossa.  . Electronically signed by Harold Barban MD on 05/19/2022 at 8:46:38 PM.    Final    US Abdomen Limited  Result Date: 05/19/2022 CLINICAL DATA:  Elevated liver enzymes EXAM: ULTRASOUND ABDOMEN LIMITED RIGHT UPPER QUADRANT COMPARISON:  11/03/2016 FINDINGS: Gallbladder: No gallstones or wall thickening visualized. No sonographic Murphy sign noted by sonographer. Common bile duct: Diameter: 5 mm. Liver: No focal lesion identified. Diffusely increased hepatic parenchymal echogenicity. Portal vein is patent on color Doppler imaging with normal direction of blood flow towards the liver. Other: None. IMPRESSION: The echogenicity of the liver is increased. This is a nonspecific finding but is most commonly seen with fatty infiltration of the liver. There are no obvious focal liver lesions. Electronically Signed   By: Davina Poke D.O.   On: 05/19/2022 11:02   DG Chest Port 1 View  Result Date: 05/19/2022 CLINICAL DATA:  Atrial fibrillation EXAM: PORTABLE CHEST 1 VIEW COMPARISON:  05/16/2022 FINDINGS: Lungs are clear. No pneumothorax or pleural effusion. Metallic density compatible with a 26 mm coin is again seen in the expected location of the distal esophagus above the level the fundoplication noted on CT examination of 03/23/2022. Cardiac size within normal limits. Central pulmonary arteries demonstrate vascular engorgement, progressive since prior examination in keeping with changes of early cardiogenic failure or volume overload. No superimposed overt pulmonary edema. Healed right rib fracture noted.  IMPRESSION: 1. Interval development of central pulmonary vascular engorgement in keeping with changes of early cardiogenic failure or volume overload. 2. 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 Electronically Signed   By: Fidela Salisbury M.D.   On: 05/19/2022 04:07    Scheduled Meds:  chlordiazePOXIDE  25 mg Oral QID   Followed by   Derrill Memo ON 05/21/2022] chlordiazePOXIDE  25 mg Oral TID   Followed by   Derrill Memo ON 05/22/2022] chlordiazePOXIDE  25 mg Oral BH-qamhs   Followed by   Derrill Memo ON 05/23/2022] chlordiazePOXIDE  25 mg Oral Daily   clotrimazole   Topical BID   folic acid  1 mg Oral Daily   iohexol  500 mL Oral Q1H   metoprolol succinate  12.5 mg Oral Daily   multivitamin with minerals  1 tablet Oral Daily   PHENObarbital  97.5 mg Intravenous Q8H   Followed by   [START ON 05/21/2022] PHENObarbital  65 mg Intravenous Q8H   Followed by   Derrill Memo ON 05/23/2022] PHENObarbital  32.5 mg Intravenous Q8H   rosuvastatin  10 mg Oral Daily   thiamine  100 mg Oral Daily   Continuous Infusions:  amiodarone 30 mg/hr (05/20/22 0500)   cefTRIAXone (ROCEPHIN)  IV 2 g (05/20/22 1006)   heparin 900 Units/hr (05/20/22 6962)   vancomycin Stopped (05/19/22 2124)   PRN Meds:.acetaminophen, LORazepam, methocarbamol   ASSESMENT:   Coin like object seen on x-ray    Rapid A fib.  Home Xarelto for atrial fib and prior RVR.  Diastolic CHF.  On heparin and amio gtt. heart rate improved and currently sinus rhythm in the 90s.  BNP 996.  CXR demonstrating early volume overload.    Coagulopathy.  At home on chronic Xarelto.  INR is 3.6.  Macrocytic anemia. Hgb 9.3..  9.8..  9.7.  This is within range of previous levels between August and September of this year    Fatty liver in ETOH use disorder.  Elevated transaminases, AST.Marland Kitchen  ALT consistent with alcoholic hepatitis.  No evidence for cirrhosis on ultrasound.  Has not had hepatitis serologies since 2015.    ETOH  use disorder and dependence.  Receiving meds for active alcohol withdrawal.  More alert today.      Hypokelemia.     Mild hyponatremia.  Hx of same periodically.    Homelessness, complicates medical care/compliance.   PLAN     EGD rescheduled for tomorrow morning 0830.  Can have full liquids today, I ordered these.  N.p.o. after midnight.  Stop heparin drip at 0400 in AM.     Hep B surface Ab, hep B surface Ag, HCV Ab in AM.      Azucena Freed  05/20/2022, 11:27 AM Phone [phone]    Attending physician's note   I have taken history, reviewed the chart and examined the patient. I performed a substantive portion of this encounter, including complete performance of at least one of the key components, in conjunction with the APP. I agree with  the Advanced Practitioner's note, impression and recommendations.   Appreciate cardiology input.  Xarelto on hold. Nonobstructive FB esophagus- coin.  Tolerating p.o. without any problems. Note - HH repair in past. Proceed with EGD in AM.  Explained pt the risks of bleeding/perforation/aspiration. No anesthesia slots were available today. Hold heparin 6 hours prior. Recheck INR.  [place] - fatty liver. CT AP- neg for pHTN LFTs- stable. Likely d/t ETOH liver Dz. Note pt is on amiodarone as well. Check acute hepatitis panel.  Trend LFTs.   Carmell Austria, MD [facility] [phone]

## 2022-05-20 NOTE — Progress Notes (Addendum)
Daily Progress Note Intern Pager: [phone]  Patient name: Anthony [name] Medical record number: [id number] Date of birth: 01-Sep-1955 Age: 66 y.o. Gender: male  Primary Care Provider: Jacelyn Grip, MD Consultants: GI, Cardiology Code Status: Full  Pt Overview and Major Events to Date:  10/31 Admitted to FMTS  Assessment and Plan:  Creedon A Medlock is a 66 y.o. male presenting with back pain, SOB, and BLE swelling.  Currently being treated for acute on chronic diastolic CHF, A-fib, alcohol withdrawal.  Pertinent PMH/PSH includes A-fib, HFpEF, hypertension, alcohol use disorder .  * Sepsis ([place]) Afebrile overnight. HR controlled on amiodarone drip. WBC stable.  Possible source of left distal cellulitis. On exam leg has mild edema and erythema.  - cont Rocephin (10/31 - ) - monitor fever curve, if temp >100.4 re-culture  - f/u blood and urine cx - ordered CT AP with IV and oral contrast   Atrial fibrillation with RVR (HCC) HR <100 overnight. Overall asymptomatic. Has been off of his metoprolol, amiodarone, and xarelto for an unknown amount of time.  -Cardiology consulted, appreciate recommendations  -Continue amnio drip  -Monitor electrolytes (Keep Mg>2 and K>4)  Acute on chronic diastolic CHF (congestive heart failure) ([place]) Has 2+ pitting edema to BLE. CXR: early pulmonary congestion. Appears to be on lasix 20 mg PO daily outpatient but is unable to take medication. - 1000cc urine output 10/31 -Continue home metoprolol 12.5 mg daily -Strict I/Os, daily weights  Alcohol use disorder, severe, dependence ([place]) Last drink 10/30.  CIWA elevated to 13 overnight  - CIWA protocol  - Cont Phenobarb taper with 1 mg Ativan q4h prn - start Librium taper in anticipation for worsening symptoms at 48 hours from last drink  - Thiamine, folate, MVI - low threshold for CCM consult/precedex gtt  Loose stools x3 overnight, febrile to 100.4, WBC stable.  - consider c diff testing   Foreign  body in esophagus CXR shows likely coin in distal esophagus. Patient denies swallowing coin or other foreign body to his knowledge. - GI consulted, appreciate recommendations  - cont heparin  - planning for EGD once stabilized.   Back pain Acute on chronic pain since yesterday. No neuro deficits on exam. Has been sleeping on benches outside which he says has exacerbated his pain. He usually takes a muscle relaxer and tylenol PM, though it has been a couple days since his last dose. Suspect pain 2/2 chronic compression fractures at L2-4.  -Tylenol, robaxin for pain -Can consider lidocaine patch and/or x-ray if not controlled -PT/OT  Hypomagnesemia Stable. 1.8 on 11/1  Macrocytic anemia Chronic, stable. Likely 2/2 alcohol use. -Continue folate, thiamine, MVI as above -daily CBC  HTN (hypertension) BP Stable overnight.  -Monitor BP -Cont metoprolol   Poor social situation Currently unhoused. Likely contributing to lack of follow up with PCP and inability to obtain medications, leading to recurrent hospitalizations and chronic disease exacerbations. -TOC consult for dispo planning    FEN/GI: Heart healthy  PPx: Xarelto  Dispo: pending clinical improvement . Barriers include ongoing medical management.   Subjective:  No acute events overnight. Patient is oriented to self and place. He has a mild slurred speech. PT at bedside. Denies being in pain.   Objective: Temp:  [97.5 F (36.4 C)-100.4 F (38 C)] 97.9 F (36.6 C) (11/01 1001) Pulse Rate:  [84-156] 88 (11/01 1001) Resp:  [19-42] 19 (11/01 1001) BP: (102-182)/(61-161) 113/82 (11/01 1001) SpO2:  [81 %-100 %] 94 % (11/01 0755) Weight:  [  68.3 kg] 68.3 kg (11/01 0424) Physical Exam: General: Chronically ill appearing  Cardiovascular: regular rate, irregularly regular rhythm, no murmur  Respiratory: clear, no wheezing, no crackles Abdomen: soft, non-tender, non-distended  Extremities: mild peripheral edema LLE with mild  erythema concerning for cellulitis, no warm to touch.   Laboratory: Most recent CBC Lab Results  Component Value Date   WBC 11.1 (H) 05/20/2022   HGB 8.7 (L) 05/20/2022   HCT 25.3 (L) 05/20/2022   MCV 105.9 (H) 05/20/2022   PLT 141 (L) 05/20/2022   Most recent BMP    Latest Ref Rng & Units 05/20/2022    3:10 AM  BMP  Glucose 70 - 99 mg/dL 118   BUN 8 - 23 mg/dL 20   Creatinine 0.61 - 1.24 mg/dL 0.80   Sodium 135 - 145 mmol/L 134   Potassium 3.5 - 5.1 mmol/L 3.1   Chloride 98 - 111 mmol/L 103   CO2 22 - 32 mmol/L 22   Calcium 8.9 - 10.3 mg/dL 7.5   PTT 62>54  Darci Current, DO 05/20/2022, 10:54 AM  PGY-1, [facility] Intern pager: [phone], text pages welcome Secure chat group [place]

## 2022-05-20 NOTE — Progress Notes (Signed)
Progress Note  Patient Name: Anthony [name] Date of Encounter: 05/20/2022  Primary Cardiologist: Lauree Chandler, MD   Subjective   Patient seen and examined at her bedside. He was being set up in bed after working with physical therapy.  Inpatient Medications    Scheduled Meds:  clotrimazole   Topical BID   folic acid  1 mg Oral Daily   metoprolol succinate  12.5 mg Oral Daily   multivitamin with minerals  1 tablet Oral Daily   PHENObarbital  97.5 mg Intravenous Q8H   Followed by   [START ON 05/21/2022] PHENObarbital  65 mg Intravenous Q8H   Followed by   Derrill Memo ON 05/23/2022] PHENObarbital  32.5 mg Intravenous Q8H   potassium chloride  40 mEq Oral Q4H   rosuvastatin  10 mg Oral Daily   thiamine  100 mg Oral Daily   Continuous Infusions:  amiodarone 30 mg/hr (05/20/22 0500)   cefTRIAXone (ROCEPHIN)  IV Stopped (05/19/22 0915)   heparin 900 Units/hr (05/20/22 0620)   vancomycin Stopped (05/19/22 2124)   PRN Meds: acetaminophen, LORazepam, methocarbamol   Vital Signs    Vitals:   05/19/22 2200 05/20/22 0005 05/20/22 0015 05/20/22 0424  BP: 106/66 (!) 129/95 110/68 103/61  Pulse: 96 87 100 86  Resp: (!) 42 (!) '22 20 20  '$ Temp:  (!) 100.4 F (38 C) (!) 100.4 F (38 C) 99.4 F (37.4 C)  TempSrc:  Axillary  Axillary  SpO2: 94% 94%  97%  Weight:  68.3 kg  68.3 kg  Height:        Intake/Output Summary (Last 24 hours) at 05/20/2022 1610 Last data filed at 05/20/2022 0500 Gross per 24 hour  Intake 1571.15 ml  Output 1000 ml  Net 571.15 ml   Filed Weights   05/19/22 0336 05/20/22 0005 05/20/22 0424  Weight: 64.5 kg 68.3 kg 68.3 kg    Telemetry    Atrial fibrillation with rvr - Personally Reviewed  ECG    None today  - Personally Reviewed  Physical Exam    General: Comfortable Head: Atraumatic, normal size  Eyes: PEERLA, EOMI  Neck: Supple, normal JVD Cardiac: Normal S1, S2; RRR; no murmurs, rubs, or gallops Lungs: Clear to auscultation  bilaterally Abd: Soft, nontender, no hepatomegaly  Ext: warm, no edema Musculoskeletal: No deformities, BUE and BLE strength normal and equal Skin: Warm and dry, no rashes   Neuro: Alert and oriented to person, place, time, and situation, CNII-XII grossly intact, no focal deficits  Psych: Normal mood and affect   Labs    Chemistry Recent Labs  Lab 05/16/22 0204 05/19/22 0345 05/20/22 0310  NA 140 136 134*  K 3.7 4.1 3.1*  CL 107 106 103  CO2 20* 13* 22  GLUCOSE 95 80 118*  BUN '10 14 20  '$ CREATININE 0.88 0.89 0.80  CALCIUM 8.1* 7.8* 7.5*  PROT  --  6.6 5.5*  ALBUMIN  --  2.8* 2.2*  AST  --  150* 171*  ALT  --  45* 45*  ALKPHOS  --  56 67  BILITOT  --  0.7 0.6  GFRNONAA >60 >60 >60  ANIONGAP 13 17* 9     Hematology Recent Labs  Lab 05/16/22 0204 05/19/22 0345 05/20/22 0310  WBC 5.2 14.8* 11.1*  RBC 2.50* 2.58* 2.39*  HGB 9.3* 9.8* 8.7*  HCT 27.2* 28.9* 25.3*  MCV 108.8* 112.0* 105.9*  MCH 37.2* 38.0* 36.4*  MCHC 34.2 33.9 34.4  RDW 16.7* 17.9* 17.4*  PLT  135* 172 141*    Cardiac EnzymesNo results for input(s): "TROPONINI" in the last 168 hours. No results for input(s): "TROPIPOC" in the last 168 hours.   BNP Recent Labs  Lab 05/16/22 0209 05/19/22 0345  BNP 465.3* 996.8*     DDimer No results for input(s): "DDIMER" in the last 168 hours.   Radiology    VAS [place] LOWER EXTREMITY VENOUS (DVT)  Result Date: 05/19/2022  Lower Venous DVT Study Patient Name:  Anthony [name]  Date of Exam:   05/19/2022 Medical Rec #: [id number]     Accession #:    [id number] Date of Birth: May 09, 1956     Patient Gender: M Patient Age:   66 years Exam Location:  [facility] Procedure:      VAS [place] LOWER EXTREMITY VENOUS (DVT) Referring Phys: TODD MCDIARMID --------------------------------------------------------------------------------  Indications: Swelling, and Edema.  Limitations: Patient positioning. Comparison Study: no prior Performing Technologist: Archie Patten RVS   Examination Guidelines: A complete evaluation includes B-mode imaging, spectral Doppler, color Doppler, and power Doppler as needed of all accessible portions of each vessel. Bilateral testing is considered an integral part of a complete examination. Limited examinations for reoccurring indications may be performed as noted. The reflux portion of the exam is performed with the patient in reverse Trendelenburg.  +-----+---------------+---------+-----------+----------+-----------------------+ RIGHTCompressibilityPhasicitySpontaneityPropertiesThrombus Aging          +-----+---------------+---------+-----------+----------+-----------------------+ CFV                                               not visualized due to                                                     patient positioning     +-----+---------------+---------+-----------+----------+-----------------------+   +---------+---------------+---------+-----------+----------+-------------------+ LEFT     CompressibilityPhasicitySpontaneityPropertiesThrombus Aging      +---------+---------------+---------+-----------+----------+-------------------+ CFV      Full           Yes      Yes                                      +---------+---------------+---------+-----------+----------+-------------------+ SFJ      Full                                                             +---------+---------------+---------+-----------+----------+-------------------+ FV Prox  Full                                                             +---------+---------------+---------+-----------+----------+-------------------+ FV Mid   Full                                                             +---------+---------------+---------+-----------+----------+-------------------+  FV DistalFull                                                              +---------+---------------+---------+-----------+----------+-------------------+ PFV      Full                                                             +---------+---------------+---------+-----------+----------+-------------------+ POP      Full           Yes      Yes                                      +---------+---------------+---------+-----------+----------+-------------------+ PTV      Full           Yes      Yes                                      +---------+---------------+---------+-----------+----------+-------------------+ PERO                                                  Not well visualized +---------+---------------+---------+-----------+----------+-------------------+     Summary: LEFT: - There is no evidence of deep vein thrombosis in the lower extremity.  - No cystic structure found in the popliteal fossa.  *See table(s) above for measurements and observations. Electronically signed by Harold Barban MD on 05/19/2022 at 8:46:38 PM.    Final    US Abdomen Limited  Result Date: 05/19/2022 CLINICAL DATA:  Elevated liver enzymes EXAM: ULTRASOUND ABDOMEN LIMITED RIGHT UPPER QUADRANT COMPARISON:  11/03/2016 FINDINGS: Gallbladder: No gallstones or wall thickening visualized. No sonographic Murphy sign noted by sonographer. Common bile duct: Diameter: 5 mm. Liver: No focal lesion identified. Diffusely increased hepatic parenchymal echogenicity. Portal vein is patent on color Doppler imaging with normal direction of blood flow towards the liver. Other: None. IMPRESSION: The echogenicity of the liver is increased. This is a nonspecific finding but is most commonly seen with fatty infiltration of the liver. There are no obvious focal liver lesions. Electronically Signed   By: Davina Poke D.O.   On: 05/19/2022 11:02   DG Chest Port 1 View  Result Date: 05/19/2022 CLINICAL DATA:  Atrial fibrillation EXAM: PORTABLE CHEST 1 VIEW COMPARISON:  05/16/2022 FINDINGS:  Lungs are clear. No pneumothorax or pleural effusion. Metallic density compatible with a 26 mm coin is again seen in the expected location of the distal esophagus above the level the fundoplication noted on CT examination of 03/23/2022. Cardiac size within normal limits. Central pulmonary arteries demonstrate vascular engorgement, progressive since prior examination in keeping with changes of early cardiogenic failure or volume overload. No superimposed overt pulmonary edema. Healed right rib fracture noted. IMPRESSION: 1. Interval development of central pulmonary vascular engorgement in keeping with changes of early cardiogenic failure  or volume overload. 2. 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 Electronically Signed   By: Fidela Salisbury M.D.   On: 05/19/2022 04:07    Cardiac Studies   Relevant CV Studies: Cardiac CTA 10/02/20 IMPRESSION: 1. Coronary calcium score of 884. This was 91st percentile for age, sex, and race matched control.   2. Normal coronary origin.  Right dominance.   3. CAD-RADS 3. Moderate stenosis. Consider symptom-guided anti-ischemic pharmacotherapy as well as risk factor modification per guideline directed care. Additional analysis with CT FFR will be submitted.   4. 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   CT FFR analysis shows no evidence of significant functional stenosis. Recommend aggressive risk factor management for non-obstructive calcifications.   Echo 03/17/22 1. Left ventricular ejection fraction, by estimation, is 55 to 60%. The  left ventricle has normal function. The left ventricle has no regional  wall motion abnormalities. Left ventricular diastolic parameters are  consistent with Grade III diastolic  dysfunction (restrictive). Elevated left atrial pressure.   2. Right ventricular systolic function is normal. The right ventricular  size is  mildly enlarged. There is normal pulmonary artery systolic  pressure. The estimated right ventricular systolic pressure is 87.8 mmHg.   3. Left atrial size was mildly dilated.   4. The mitral valve is abnormal. Mild to moderate mitral valve  regurgitation. No evidence of mitral stenosis.   5. The aortic valve is tricuspid. There is mild calcification of the  aortic valve. There is mild thickening of the aortic valve. Aortic valve  regurgitation is not visualized. No aortic stenosis is present.   6. The inferior vena cava is normal in size with greater than 50%  respiratory variability, suggesting right atrial pressure of 3 mmHg.   Comparison(s): Mitral regurgitation increased from prior.   FINDINGS   Left Ventricle: Left ventricular ejection fraction, by estimation, is 55  to 60%. The left ventricle has normal function. The left ventricle has no  regional wall motion abnormalities. The left ventricular internal cavity  size was normal in size. There is   no left ventricular hypertrophy. Left ventricular diastolic parameters  are consistent with Grade III diastolic dysfunction (restrictive).  Elevated left atrial pressure.   Right Ventricle: The right ventricular size is mildly enlarged. No  increase in right ventricular wall thickness. Right ventricular systolic  function is normal. There is normal pulmonary artery systolic pressure.  The tricuspid regurgitant velocity is 2.82   m/s, and with an assumed right atrial pressure of 3 mmHg, the estimated  right ventricular systolic pressure is 67.6 mmHg.   Left Atrium: Left atrial size was mildly dilated.   Right Atrium: Right atrial size was normal in size.   Pericardium: There is no evidence of pericardial effusion.   Mitral Valve: Mitral valve billowing. The mitral valve is abnormal. Mild  to moderate mitral valve regurgitation. No evidence of mitral valve  stenosis.   Tricuspid Valve: The tricuspid valve is normal in structure.  Tricuspid  valve regurgitation is mild . No evidence of tricuspid stenosis.   Aortic Valve: The aortic valve is tricuspid. There is mild calcification  of the aortic valve. There is mild thickening of the aortic valve. Aortic  valve regurgitation is not visualized. No aortic stenosis is present.   Pulmonic Valve: The pulmonic valve was normal in structure. Pulmonic valve  regurgitation is not visualized. No evidence of pulmonic stenosis.  Aorta: The aortic root and ascending aorta are structurally normal, with  no evidence of dilitation.   Venous: The inferior vena cava is normal in size with greater than 50%  respiratory variability, suggesting right atrial pressure of 3 mmHg.   IAS/Shunts: No atrial level shunt detected by color flow Doppler.     Patient Profile     65 y.o. male  with a hx of atrial fib, HTN, ETOH abuse, diastolic HF, PAF, depression and unprovoked PE.   Assessment & Plan    Afib rvr  Acute alcohol withdrawal  Electrolytes Abnormality  His heart rate has improved, will continue amiodarone drip for now.  I agree with the low-dose beta-blocker. For him we will have to take [place] approach, okay with his heart rate in the low 100s especially in the setting of his acute alcohol withdrawal. Please please isolate as appropriate keep K above 4 mag above 2.    For questions or updates, please contact [place] Please consult [email or web address] for contact info under Cardiology/STEMI.      Signed, Jennilee Demarco, DO  05/20/2022, 8:22 AM

## 2022-05-20 NOTE — Assessment & Plan Note (Deleted)
x3 overnight, a febrile, WBC stable.  C. difficile testing negative.  -Continue fecal management system

## 2022-05-20 NOTE — Progress Notes (Signed)
   05/20/22 0005  Assess: MEWS Score  Temp (!) 100.4 F (38 C)  BP (!) 129/95  MAP (mmHg) 106  Pulse Rate 87  ECG Heart Rate 88  Resp (!) 22  Level of Consciousness Responds to Voice  SpO2 94 %  O2 Device Room Air  Assess: MEWS Score  MEWS Temp 0  MEWS Systolic 0  MEWS Pulse 0  MEWS RR 1  MEWS LOC 1  MEWS Score 2  MEWS Score Color Yellow  Assess: if the MEWS score is Yellow or Red  Were vital signs taken at a resting state? No  Focused Assessment No change from prior assessment  Does the patient meet 2 or more of the SIRS criteria? No (RR was up dut to restlessness)  MEWS guidelines implemented *See Row Information* No, vital signs rechecked  Treat  Pain Scale 0-10  Pain Score 10  Pain Type Chronic pain  Pain Location Back  Pain Orientation Right;Lower  Pain Radiating Towards down right leg  Pain Descriptors / Indicators Burning;Sharp  Pain Frequency Constant  Escalate  MEWS: Escalate Yellow: discuss with charge nurse/RN and consider discussing with provider and RRT  Notify: Charge Nurse/RN  Name of Charge Nurse/RN Notified Heather RN  Date Charge Nurse/RN Notified 05/20/22  Time Charge Nurse/RN Notified 0015  Notify: Provider  Provider Name/Title Wells MD  Date Provider Notified 05/20/22  Time Provider Notified 0005  Method of Notification Call  Notification Reason Other (Comment) (Increased temperature)  Provider response No new orders  Date of Provider Response 05/20/22  Time of Provider Response 0015  Document  Patient Outcome Stabilized after interventions  Progress note created (see row info) Yes  Assess: SIRS CRITERIA  SIRS Temperature  0  SIRS Pulse 0  SIRS Respirations  1  SIRS WBC 1  SIRS Score Sum  2

## 2022-05-20 NOTE — TOC Initial Note (Signed)
Transition of Care [facility]) - Initial/Assessment Note    Patient Details  Name: Anthony [name] MRN: [id number] Date of Birth: 04-Nov-1955  [facility] of [facility]) CM/SW Contact:    Milas Gain, [place] Phone Number: 05/20/2022, 1:41 PM  Clinical Narrative:                  CSW received consult for possible SNF placement at time of discharge. Due to patients current orientation CSW spoke with patients brother [name].CSW spoke with patients brother regarding PT recommendation of SNF placement for patient at time of discharge.Patients brother reports he is unsure if patient is homeless or if is staying with someone. Patients brother would like for CSW to follow up with patient when better orientated regarding PT/OT recommendations. CSW following to speak with patient when better oriented regarding PT/OT recommendation. CSW received consult for food resources,shelter resources,and substance resources. CSW following to speak with patient when appropriate. No further questions reported at this time. CSW to continue to follow and assist with discharge planning needs.        Patient Goals and CMS Choice        Expected Discharge Plan and Services                                                Prior Living Arrangements/Services                       Activities of Daily Living      Permission Sought/Granted                  Emotional Assessment              Admission diagnosis:  Paroxysmal atrial fibrillation ([place]) [I48.0] Atrial fibrillation with RVR (HCC) [I48.91] Non compliance w medication regimen [Z91.148] Acute on chronic congestive heart failure, unspecified heart failure type ([place]) [I50.9] Patient Active Problem List   Diagnosis Date Noted   Loose stools 05/20/2022   Acute on chronic congestive heart failure ([place])    Foreign body in esophagus 05/19/2022   Leukocytosis 05/19/2022   Sepsis ([place]) 05/19/2022   Cellulitis of left leg, possible  05/19/2022    Class: Question of   Moniliasis, interdigital 05/19/2022   Open toe wound, right foot, medial fourth digit 05/19/2022   Malnutrition ([place]) 05/19/2022   Alcohol abuse 03/24/2022   Dyslipidemia 03/24/2022   Acute on chronic diastolic CHF (congestive heart failure) ([place]) 03/24/2022   Acute respiratory failure with hypoxia (HCC) 03/23/2022   Generalized weakness    Atrial fibrillation with RVR ([place]) 03/15/2022   Hypokalemia 03/15/2022   Iron deficiency anemia 10/16/2020   Cough    Hypomagnesemia    Protein-calorie malnutrition, severe 08/22/2020   Alcohol use disorder, severe, in controlled environment ([place])    Pressure injury of skin 08/18/2020   Urinary tract infection without hematuria    Severe sepsis ([place]) 08/14/2020   Paroxysmal atrial fibrillation ([place]) 08/13/2020   Rhabdomyolysis 07/22/2020   Cerebral ventriculomegaly 07/22/2020   Hemoglobin decreased 07/22/2020   History of delirium tremons 07/22/2020   Hypoalbuminemia due to protein-calorie malnutrition ([place]) 07/22/2020   Tobacco use disorder 07/22/2020   Alcohol withdrawal delirium ([place])    Dehydration    Alcohol abuse with alcohol-induced mood disorder ([place]) 07/18/2018   Depression 05/26/2018   Anxiety state 04/25/2018   Need  for immunization against influenza 04/25/2018   Alcohol withdrawal ([place]) 09/01/2017   DOE (dyspnea on exertion) 11/27/2016   Actinic keratosis 11/27/2016   Macrocytic anemia 11/23/2016   History of pulmonary embolus (PE) 11/02/2016   Hiatal hernia 09/14/2016   Skin cancer 08/13/2016   Poor social situation 06/09/2013   Tinea versicolor 06/08/2013   Back pain 05/07/2013   Seborrheic dermatitis of scalp 11/08/2012   HTN (hypertension) 04/11/2012   Alcohol use disorder, severe, dependence ([place]) 09/16/2006   PCP:  Jacelyn Grip, MD Pharmacy:   [place] [place] [address] [place] [address] Phone: [phone]  Fax: [place], [address] [place] [address] Phone: [phone] Fax: [phone]     Social Determinants of Health (SDOH) Interventions    Readmission Risk Interventions    10/08/2020    2:48 PM  Readmission Risk Prevention Plan  Transportation Screening Complete  PCP or Specialist Appt within 3-5 Days Complete  HRI or [place] Complete  Social Work Consult for [place] Planning/Counseling Complete  Palliative Care Screening Complete  Medication Review Press photographer) Complete

## 2022-05-20 NOTE — Evaluation (Signed)
Physical Therapy Evaluation Patient Details Name: Anthony [name] MRN: [id number] DOB: 04-03-1956 Today's Date: 05/20/2022  History of Present Illness  Pt is 66 y/o M admitted to [facility] 05/19/22 with LE swelling, back pain, SOB, workup for sepsis. Of note, pt with 5 ED visits in past 12 months. PMH to include afib with RVR, CHF, chronic LBP, PEs, depression, delirium, alcohol abuse, homelessness.   Clinical Impression  Pt presents with an overall decrease in functional mobility secondary to above. PTA details challenging to determine due to pt's current cognitive status and lack of forthcoming information while in CIWA. ED notes indicate pt is homeless and utilizes shelters as needed during cold months. Today, pt able to ambulate 39f with RW, min guard to minA with excess verbal cues due to lack of safety awareness. Pt required several brief cues to engage in therapy session. Pt would benefit from continued acute PT services to maximize functional mobility and independence prior to d/c to SNF level therapies (will continue to follow acutely should pt status change).     Recommendations for follow up therapy are one component of a multi-disciplinary discharge planning process, led by the attending physician.  Recommendations may be updated based on patient status, additional functional criteria and insurance authorization.  Follow Up Recommendations Skilled nursing-short term rehab (<3 hours/day) Can patient physically be transported by private vehicle: Yes    Assistance Recommended at Discharge Intermittent Supervision/Assistance  Patient can return home with the following  A [place] help with walking and/or transfers;A Staller help with bathing/dressing/bathroom;Assist for transportation;Help with stairs or ramp for entrance    Equipment Recommendations Other (comment) (TBD)  Recommendations for Other Services       Functional Status Assessment Patient has had a recent decline in their functional  status and demonstrates the ability to make significant improvements in function in a reasonable and predictable amount of time.     Precautions / Restrictions Precautions Precautions: Fall Precaution Comments: rectal tube, catheter, UE restraints Restrictions Weight Bearing Restrictions: No      Mobility  Bed Mobility Overal bed mobility: Needs Assistance Bed Mobility: Supine to Sit     Supine to sit: Min assist, Mod assist     General bed mobility comments: pt demonstrated core strength to sit up EOB, could not maintain position and required modA for upright trunk position and Tab cues for LE placement. minA to scoot hips forward once sitting EOB, as well as to laterally scoot to get closer to head of bead after ambulation    Transfers Overall transfer level: Needs assistance Equipment used: Rolling walker (2 wheels) Transfers: Sit to/from Stand Sit to Stand: Min guard, From elevated surface           General transfer comment: min guard for line management cues for hand placement    Ambulation/Gait Ambulation/Gait assistance: Min guard, Min assist Gait Distance (Feet): 60 Feet Assistive device: Rolling walker (2 wheels) Gait Pattern/deviations: Narrow base of support, Step-through pattern, Decreased stride length Gait velocity: decreased     General Gait Details: excess verbal cues necessary for safe ambulation with RW, pt responded better to "step into the walker". intermittent minA necessary for steading RW when it would get out of pts BOS  Stairs            Wheelchair Mobility    Modified Rankin (Stroke Patients Only)       Balance Overall balance assessment: Needs assistance Sitting-balance support: Bilateral upper extremity supported, Feet supported, Single extremity supported Sitting  balance-Leahy Scale: Fair Sitting balance - Comments: pt cued to use UEs to help maintain balance when sitting EOB   Standing balance support: Bilateral upper  extremity supported, During functional activity, Reliant on assistive device for balance Standing balance-Leahy Scale: Poor Standing balance comment: reliant on RW for UE support, pt did remove hands one time to talk with his hands and experience minor LOB posteriorly requiring minA for correction                             Pertinent Vitals/Pain Pain Assessment Faces Pain Scale: Hurts even more Pain Location: back pain Pain Descriptors / Indicators: Aching, Grimacing, Moaning Pain Intervention(s): Monitored during session, Repositioned    Home Living Family/patient expects to be discharged to:: Shelter/Homeless                   Additional Comments: encounter in ED states pt is homeless. multiple attempts to ask pt about plan for location after d/c, did not provide any information, stated "thats a great question" when inquiring about plan    Prior Function Prior Level of Function : Patient poor historian/Family not available             Mobility Comments: pt indicated he doesn't use equipment because he doesnt know what he will need until he needs it ADLs Comments: per previous admission note, pt alternates between street living and homeless shelters in the colder months     Hand Dominance        Extremity/Trunk Assessment   Upper Extremity Assessment Upper Extremity Assessment: Generalized weakness (functional use of UEs to >90 deg overhead)    Lower Extremity Assessment Lower Extremity Assessment: Overall WFL for tasks assessed;Generalized weakness (functional AROM of legs during bed mobility)    Cervical / Trunk Assessment Cervical / Trunk Assessment: Normal  Communication   Communication: No difficulties  Cognition Arousal/Alertness: Awake/alert, Lethargic Behavior During Therapy: Flat affect Overall Cognitive Status: No family/caregiver present to determine baseline cognitive functioning Area of Impairment: Attention, Memory, Following  commands, Safety/judgement, Awareness, Problem solving                   Current Attention Level: Sustained Memory: Decreased short-term memory Following Commands: Follows one step commands inconsistently, Follows one step commands with increased time Safety/Judgement: Decreased awareness of safety, Decreased awareness of deficits Awareness: Emergent Problem Solving: Slow processing, Decreased initiation, Requires tactile cues, Requires verbal cues General Comments: pt oriented to person and place, some ability to express needing help when out of hospital but unable to maintain focus on conversation. DO enter room three times and pt minimally participating in conversation, great difficulty multitasking with mobility and conversation. STM impaired as pt repeated same statement of lumbar vertebrae fractures 2-3x        General Comments General comments (skin integrity, edema, etc.): CIWA protocol, VSS during session    Exercises     Assessment/Plan    PT Assessment Patient needs continued PT services  PT Problem List Decreased strength;Decreased activity tolerance;Decreased balance;Decreased mobility;Decreased safety awareness;Decreased coordination       PT Treatment Interventions DME instruction;Balance training;Gait training;Stair training;Functional mobility training;Therapeutic activities;Therapeutic exercise;Patient/family education    PT Goals (Current goals can be found in the Care Plan section)  Acute Rehab PT Goals Patient Stated Goal: to have less back pain PT Goal Formulation: With patient Time For Goal Achievement: 06/03/22 Potential to Achieve Goals: Fair    Frequency Min 3X/week  Co-evaluation               AM-PAC PT "6 Clicks" Mobility  Outcome Measure Help needed turning from your back to your side while in a flat bed without using bedrails?: None Help needed moving from lying on your back to sitting on the side of a flat bed without using  bedrails?: A Lot Help needed moving to and from a bed to a chair (including a wheelchair)?: A Gazda Help needed standing up from a chair using your arms (e.g., wheelchair or bedside chair)?: A Yoshimura Help needed to walk in hospital room?: A Tsuchiya Help needed climbing 3-5 steps with a railing? : A Lot 6 Click Score: 17    End of Session Equipment Utilized During Treatment: Gait belt Activity Tolerance: Patient limited by fatigue;Patient tolerated treatment well Patient left: in bed;with restraints reapplied;with call bell/phone within reach;with bed alarm set;Other (comment) (with DO in room) Nurse Communication: Mobility status;Other (comment) (IV beeping) PT Visit Diagnosis: Other abnormalities of gait and mobility (R26.89);Unsteadiness on feet (R26.81)    Time: 0762-2633 PT Time Calculation (min) (ACUTE ONLY): 23 min   Charges:   PT Evaluation $PT Eval Moderate Complexity: 1 Mod          [address] [place], SPT   [place] [facility]am 05/20/2022, 10:04 AM

## 2022-05-20 NOTE — Progress Notes (Addendum)
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   He again denies knowledge of having swallowed anything coin like.  Not having any chest pain, odynophagia, dysphagia.  No shortness of breath.  OBJECTIVE:         Vital signs in last 24 hours:    Temp:  [97.5 F (36.4 C)-100.4 F (38 C)] 97.9 F (36.6 C) (11/01 1001) Pulse Rate:  [84-156] 88 (11/01 1001) Resp:  [19-42] 19 (11/01 1001) BP: (102-182)/(61-161) 113/82 (11/01 1001) SpO2:  [81 %-100 %] 94 % (11/01 0755) Weight:  [68.3 kg] 68.3 kg (11/01 0424) Last BM Date : 05/20/22 Filed Weights   05/19/22 0336 05/20/22 0005 05/20/22 0424  Weight: 64.5 kg 68.3 kg 68.3 kg   General: Patient is more alert today.  He is able to answer questions appropriately Heart: Telemetry with sinus rhythm in the 90s. Chest: No labored breathing.  No cough.  No tenderness on palpation of sternal region. Abdomen: Soft without tenderness.  Active bowel sounds. Extremities: No CCE. Neuro/Psych: Pleasant.  Answers questions appropriately but quite slowly.  Stays focused.  Has soft wrist restraints in place but currently not acutely agitated.  Intake/Output from previous day: 10/31 0701 - 11/01 0700 In: 1571.2 [I.V.:952.6; IV Piggyback:618.6] Out: 1000 [Urine:1000]  Intake/Output this shift: No intake/output data recorded.  Lab Results: Recent Labs    05/19/22 0345 05/20/22 0310  WBC 14.8* 11.1*  HGB 9.8* 8.7*  HCT 28.9* 25.3*  PLT 172 141*   BMET Recent Labs    05/19/22 0345 05/20/22 0310  NA 136 134*  K 4.1 3.1*  CL 106 103  CO2 13* 22  GLUCOSE 80 118*  BUN 14 20  CREATININE 0.89 0.80  CALCIUM 7.8* 7.5*   LFT Recent Labs    05/19/22 0345 05/20/22 0310  PROT 6.6 5.5*  ALBUMIN 2.8* 2.2*  AST 150* 171*  ALT 45* 45*  ALKPHOS 56 67  BILITOT 0.7  0.6   PT/INR Recent Labs    05/19/22 1103  LABPROT 35.3*  INR 3.6*   Hepatitis Panel No results for input(s): "HEPBSAG", "HCVAB", "HEPAIGM", "HEPBIGM" in the last 72 hours.  Studies/Results: VAS [place] LOWER EXTREMITY VENOUS (DVT)  Result Date: 05/19/2022 Summary: LEFT: - There is no evidence of deep vein thrombosis in the lower extremity.  - No cystic structure found in the popliteal fossa.  . Electronically signed by Harold Barban MD on 05/19/2022 at 8:46:38 PM.    Final    US Abdomen Limited  Result Date: 05/19/2022 CLINICAL DATA:  Elevated liver enzymes EXAM: ULTRASOUND ABDOMEN LIMITED RIGHT UPPER QUADRANT COMPARISON:  11/03/2016 FINDINGS: Gallbladder: No gallstones or wall thickening visualized. No sonographic Murphy sign noted by sonographer. Common bile duct: Diameter: 5 mm. Liver: No focal lesion identified. Diffusely increased hepatic parenchymal echogenicity. Portal vein is patent on color Doppler imaging with normal direction of blood flow towards the liver. Other: None. IMPRESSION: The echogenicity of the liver is increased. This is a nonspecific finding but is most commonly seen with fatty infiltration of the liver. There are no obvious focal liver lesions. Electronically Signed   By: Davina Poke D.O.   On: 05/19/2022 11:02   DG Chest Port 1 View  Result Date: 05/19/2022 CLINICAL DATA:  Atrial fibrillation EXAM: PORTABLE CHEST 1 VIEW COMPARISON:  05/16/2022 FINDINGS: Lungs are clear. No pneumothorax or pleural effusion. Metallic density compatible with a 26 mm coin is again seen in the expected location of the distal esophagus above the level the fundoplication noted on CT examination of 03/23/2022. Cardiac size within normal limits. Central pulmonary arteries demonstrate vascular engorgement, progressive since prior examination in keeping with changes of early cardiogenic failure or volume overload. No superimposed overt pulmonary edema. Healed right rib fracture noted.  IMPRESSION: 1. Interval development of central pulmonary vascular engorgement in keeping with changes of early cardiogenic failure or volume overload. 2. 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 Electronically Signed   By: Fidela Salisbury M.D.   On: 05/19/2022 04:07    Scheduled Meds:  chlordiazePOXIDE  25 mg Oral QID   Followed by   Derrill Memo ON 05/21/2022] chlordiazePOXIDE  25 mg Oral TID   Followed by   Derrill Memo ON 05/22/2022] chlordiazePOXIDE  25 mg Oral BH-qamhs   Followed by   Derrill Memo ON 05/23/2022] chlordiazePOXIDE  25 mg Oral Daily   clotrimazole   Topical BID   folic acid  1 mg Oral Daily   iohexol  500 mL Oral Q1H   metoprolol succinate  12.5 mg Oral Daily   multivitamin with minerals  1 tablet Oral Daily   PHENObarbital  97.5 mg Intravenous Q8H   Followed by   [START ON 05/21/2022] PHENObarbital  65 mg Intravenous Q8H   Followed by   Derrill Memo ON 05/23/2022] PHENObarbital  32.5 mg Intravenous Q8H   rosuvastatin  10 mg Oral Daily   thiamine  100 mg Oral Daily   Continuous Infusions:  amiodarone 30 mg/hr (05/20/22 0500)   cefTRIAXone (ROCEPHIN)  IV 2 g (05/20/22 1006)   heparin 900 Units/hr (05/20/22 9983)   vancomycin Stopped (05/19/22 2124)   PRN Meds:.acetaminophen, LORazepam, methocarbamol   ASSESMENT:   Coin like object seen on x-ray    Rapid A fib.  Home Xarelto for atrial fib and prior RVR.  Diastolic CHF.  On heparin and amio gtt. heart rate improved and currently sinus rhythm in the 90s.  BNP 996.  CXR demonstrating early volume overload.    Coagulopathy.  At home on chronic Xarelto.  INR is 3.6.  Macrocytic anemia. Hgb 9.3..  9.8..  9.7.  This is within range of previous levels between August and September of this year    Fatty liver in ETOH use disorder.  Elevated transaminases, AST.Marland Kitchen  ALT consistent with alcoholic hepatitis.  No evidence for cirrhosis on ultrasound.  Has not had hepatitis serologies since 2015.    ETOH  use disorder and dependence.  Receiving meds for active alcohol withdrawal.  More alert today.      Hypokelemia.     Mild hyponatremia.  Hx of same periodically.    Homelessness, complicates medical care/compliance.   PLAN     EGD rescheduled for tomorrow morning 0830.  Can have full liquids today, I ordered these.  N.p.o. after midnight.  Stop heparin drip at 0400 in AM.     Hep B surface Ab, hep B surface Ag, HCV Ab in AM.      Azucena Freed  05/20/2022, 11:27 AM Phone [phone]    Attending physician's note   I have taken history, reviewed the chart and examined the patient. I performed a substantive portion of this encounter, including complete performance of at least one of the key components, in conjunction with the APP. I agree with  the Advanced Practitioner's note, impression and recommendations.   Appreciate cardiology input.  Xarelto on hold. Nonobstructive FB esophagus- coin.  Tolerating p.o. without any problems. Note - HH repair in past. Proceed with EGD in AM.  Explained pt the risks of bleeding/perforation/aspiration. No anesthesia slots were available today. Hold heparin 6 hours prior. Recheck INR.  [place] - fatty liver. CT AP- neg for pHTN LFTs- stable. Likely d/t ETOH liver Dz. Note pt is on amiodarone as well. Check acute hepatitis panel.  Trend LFTs.   Carmell Austria, MD [facility] [phone]

## 2022-05-20 NOTE — Progress Notes (Signed)
FMTS Interim Progress Note  S:Evaluated at bedside after being paged for agitation. Patient was trying to take off leads. He also tried to get out of bed during the day, and there is concern he would try to remove his IV tonight. By the time I arrived, patient was calm and cooperative, though he had just received next phenobarb dose.  O: BP 118/86 (BP Location: Right Arm)   Pulse 91   Temp 100.1 F (37.8 C) (Oral)   Resp 15   Ht '5\' 10"'$  (1.778 m)   Wt 68.3 kg   SpO2 98%   BMI 21.61 kg/m   General: Sedated, intermittently opens eyes and responds to questions, in NAD Skin: Warm, dry Cardiac: Tachycardic to 100s, no m/r/g appreciated Respiratory: CTAB anteriorly, breathing comfortably on RA Abdominal: Soft, nontender, nondistended, hypoactive bowel sounds Extremities: Moves all extremities grossly equally in bed, soft restraints applied to bilateral upper extremities  A/P: Agitation Suspect 2/2 alcohol withdrawal. Improving with phenobarb dosing. Given he appears to still be pulling at IV, taking off leads, and trying to get out of bed, will reorder soft restraints. - Soft restraints x24 hours - Continue phenobarb taper with ativan 1 mg Q4H prn - CIWAs - Low threshold for CCM consult for precedex gtt if worsening  Remainder per day team note.  Ethelene Hal, MD 05/20/2022, 10:49 PM PGY-1, [facility] pager [phone]

## 2022-05-20 NOTE — ED Notes (Signed)
Floor RN confirms ready to receive patient. Patient transported upstairs on bedside monitor

## 2022-05-20 NOTE — Progress Notes (Signed)
Speech Language Pathology Treatment: Dysphagia  Patient Details Name: Anthony [name] MRN: [id number] DOB: 15-Jun-1956 Today's Date: 05/20/2022 Time: 1020-1030 SLP Time Calculation (min) (ACUTE ONLY): 10 min  Assessment / Plan / Recommendation Clinical Impression  Patient remains confused by it alert, pleasant, and cooperative this am. Requesting soda. Patient able to consume clinician provided trials of thin liquids at bedside with assist of OT for positioning with minimal signs of aspiration characterized by inconsistent cough post swallow, sometimes immediate and sometimes delayed. Coughing did decrease in nature with removal of straw and cues for single cup sips. Patient has only been cleared for clear liquids pending endoscopy.  No overt s/s of aspiration were seen during evaluation yesterday, suspect lethargy yesterday resulted in patient only consuming very small single sips provided by clinician. Now that patient is able to self feed and given confusion he is moderately impulsive, with again with cues to slow rate of intake, s/s decreased. Will initiate clear liquids with full supervision and f/u closely for tolerance and potential to advance post removed of esophageal foreign body seen on chest Xray.    HPI HPI: Pt is a 66 yo male presenting with back pain, SOB, BLE swelling. Work up still underway but with concern for CHF exacerbation, sepsis. Pt found to have foreign body (suspected coin) in his distal esophagus with endoscopy currently on hold so cleared for clear liquids only per GI. Two prior swallow evals with functional appearing swallow in light of limited dentition, recommending Dys 3-regular solids and thin liquids. PMH includes: HH, EtOH and tobacco use, a-fib, anxiety, HFpEF, HTN, unhoused for the past year      SLP Plan  Continue with current plan of care      Recommendations for follow up therapy are one component of a multi-disciplinary discharge planning process, led by the  attending physician.  Recommendations may be updated based on patient status, additional functional criteria and insurance authorization.    Recommendations  Diet recommendations: Thin liquid (clears per GI) Liquids provided via: Cup;No straw Medication Administration: Other (Comment) (per MD/GI - need to crush given foreign body?) Compensations: Slow rate;Small sips/bites                Oral Care Recommendations: Oral care BID Follow Up Recommendations: No SLP follow up Assistance recommended at discharge: None SLP Visit Diagnosis: Dysphagia, unspecified (R13.10) Plan: Continue with current plan of care        [facility] MA, [place]  05/20/2022, 10:58 AM

## 2022-05-20 NOTE — Progress Notes (Signed)
Dr. Marcha Dutton informed that restraint order expires in 4hrs

## 2022-05-20 NOTE — Progress Notes (Signed)
Initial Nutrition Assessment  DOCUMENTATION CODES:   Non-severe (moderate) malnutrition in context of social or environmental circumstances  INTERVENTION:  Ensure Enlive po TID, each supplement provides 350 kcal and 20 grams of protein MVI with minerals daily Monitor magnesium, potassium, and phosphorus BID for at least 3 days, MD to replete as needed, as pt is at risk for refeeding syndrome  NUTRITION DIAGNOSIS:   Moderate Malnutrition related to social / environmental circumstances (homeless, etoh use) as evidenced by moderate fat depletion, severe muscle depletion, moderate muscle depletion.  GOAL:   Patient will meet greater than or equal to 90% of their needs  MONITOR:   PO intake, Supplement acceptance, Diet advancement, Labs, Weight trends  REASON FOR ASSESSMENT:   Consult Assessment of nutrition requirement/status  ASSESSMENT:   Pt admitted d/t back pain, SOB and BLE swelling, found to be septic. PMH significant for CHF, afib, alcohol use disorder.  GI following. Plans for EGD tomorrow morning d/t coin like object in distal esophagus per xray.   SLP following. Plans to follow up post EGD for diet tolerance.   Per review of chart, noted pt reports of homelessness x1 year.  Per flowsheet documentation pt a/o x1. At time of visit, pt did not respond appropriately to nutrition related questions such as access to food outside of hospital. He did report that he sometimes does not eat but it is unclear the length of time in which he does not eat.   Noted CSW consulted for additional food, shelter and substance resources once medically stable for d/c.   Given pt's malnutrition and likely going extended time without adequate nutrition, he would benefit from the addition of nutrition supplements. Will also monitor refeeding labs as he is at high risk.   Within the last 2 months, it appears he has had a 4.6% weight loss which is concerning but not clinically significant for  time frame.  Current weight: 68.3 kg  Edema: moderate pitting BLE  Medications: folvite, MVI, thiamine IV drips: amio, abx, vitamin K x3 days  Labs: sodium 133, AST 171, ALT 45   NUTRITION - FOCUSED PHYSICAL EXAM:  Flowsheet Row Most Recent Value  Orbital Region Mild depletion  Upper Arm Region Moderate depletion  Thoracic and Lumbar Region Moderate depletion  Buccal Region No depletion  Temple Region Mild depletion  Clavicle Bone Region Moderate depletion  Clavicle and Acromion Bone Region Severe depletion  Scapular Bone Region Moderate depletion  Dorsal Hand Moderate depletion  Patellar Region Severe depletion  Anterior Thigh Region Severe depletion  Posterior Calf Region Severe depletion  Edema (RD Assessment) None  Hair Reviewed  Eyes Reviewed  Mouth Other (Comment)  [poor dentition]  Skin Other (Comment)  [pink scabbing on hands]  Nails Reviewed      Diet Order:   Diet Order             Diet NPO time specified  Diet effective midnight           Diet full liquid Room service appropriate? Yes; Fluid consistency: Thin  Diet effective now                   EDUCATION NEEDS:   Not appropriate for education at this time  Skin:  Skin Assessment: Reviewed RN Assessment  Last BM:  11/1 (type 7)  Height:   Ht Readings from Last 1 Encounters:  05/19/22 '5\' 10"'$  (1.778 m)    Weight:   Wt Readings from Last 1 Encounters:  05/20/22  68.3 kg   BMI:  Body mass index is 21.61 kg/m.  Estimated Nutritional Needs:   Kcal:  1900-2100  Protein:  95-110g  Fluid:  >/=1.9L  Clayborne Dana, RDN, LDN Clinical Nutrition

## 2022-05-20 NOTE — Progress Notes (Signed)
Occupational Therapy Treatment Patient Details Name: Anthony [name] MRN: [id number] DOB: [birth year] Today's Date: 05/20/2022   History of present illness Pt is 66 y/o M admitted to [facility] 05/19/22 with LE swelling, back pain, SOB, workup for sepsis. Of note, pt with 5 ED visits in past 12 months. PMH to include afib with RVR, CHF, chronic LBP, PEs, depression, delirium, alcohol abuse, homelessness.   OT comments  Pt questionable historian, reports use of AD at baseline but is unable to clarify specifics. Pt is homeless and reports living in a park in [place]. Pt currently needing min-total A for ADLs, mod A for bed mobility, and min guard for transfers with RW. Pt perseverative and needing mod cues to sequence and attend to simple task (washing face) during session. Pt presenting with impairments listed below, will follow acutely. Recommend SNF at d/c.   Recommendations for follow up therapy are one component of a multi-disciplinary discharge planning process, led by the attending physician.  Recommendations may be updated based on patient status, additional functional criteria and insurance authorization.    Follow Up Recommendations  Skilled nursing-short term rehab (<3 hours/day)    Assistance Recommended at Discharge Frequent or constant Supervision/Assistance  Patient can return home with the following  A lot of help with walking and/or transfers;A lot of help with bathing/dressing/bathroom;Assistance with cooking/housework;Direct supervision/assist for medications management;Direct supervision/assist for financial management;Assist for transportation;Help with stairs or ramp for entrance   Equipment Recommendations  None recommended by OT (defer)    Recommendations for Other Services PT consult    Precautions / Restrictions Precautions Precautions: Fall Precaution Comments: rectal tube, catheter, bil wrist restraints Restrictions Weight Bearing Restrictions: No       Mobility  Bed Mobility Overal bed mobility: Needs Assistance Bed Mobility: Sidelying to Sit, Sit to Sidelying   Sidelying to sit: Mod assist     Sit to sidelying: Mod assist General bed mobility comments: use of log rolling for comfort    Transfers Overall transfer level: Needs assistance Equipment used: Rolling walker (2 wheels) Transfers: Sit to/from Stand Sit to Stand: Min guard, From elevated surface           General transfer comment: min guard for line management cues for hand placement     Balance Overall balance assessment: Needs assistance Sitting-balance support: Bilateral upper extremity supported, Feet supported, Single extremity supported Sitting balance-Leahy Scale: Fair     Standing balance support: Bilateral upper extremity supported, During functional activity, Reliant on assistive device for balance Standing balance-Leahy Scale: Poor Standing balance comment: reliant on RW for UE support, pt did remove hands one time to talk with his hands and experience minor LOB posteriorly requiring minA for correction                           ADL either performed or assessed with clinical judgement   ADL Overall ADL's : Needs assistance/impaired Eating/Feeding: Set up;Sitting   Grooming: Minimal assistance;Standing Grooming Details (indicate cue type and reason): cues for sequencing Upper Body Bathing: Minimal assistance;Sitting;Moderate assistance;Min guard   Lower Body Bathing: Maximal assistance;Sitting/lateral leans   Upper Body Dressing : Minimal assistance;Sitting   Lower Body Dressing: Maximal assistance;Sit to/from stand;Sitting/lateral leans   Toilet Transfer: Minimal assistance;+2 for safety/equipment;Rolling walker (2 wheels);Ambulation;Regular Toilet   Toileting- Water quality scientist and Hygiene: Total assistance       Functional mobility during ADLs: Minimal assistance;+2 for safety/equipment      Extremity/Trunk Assessment Upper  Extremity Assessment Upper Extremity Assessment: Generalized weakness (shakiness, decreased fine motor skills)   Lower Extremity Assessment Lower Extremity Assessment: Defer to PT evaluation   Cervical / Trunk Assessment Cervical / Trunk Assessment: Normal    Vision   Vision Assessment?: No apparent visual deficits   Perception     Praxis      Cognition Arousal/Alertness: Awake/alert, Lethargic Behavior During Therapy: Flat affect Overall Cognitive Status: No family/caregiver present to determine baseline cognitive functioning Area of Impairment: Attention, Memory, Following commands, Safety/judgement, Awareness, Problem solving                   Current Attention Level: Sustained Memory: Decreased short-term memory Following Commands: Follows one step commands inconsistently, Follows one step commands with increased time Safety/Judgement: Decreased awareness of safety, Decreased awareness of deficits Awareness: Emergent Problem Solving: Slow processing, Decreased initiation, Requires tactile cues, Requires verbal cues General Comments: perseverative on needing to have BM, despite continued reassurance of catheter and rectal tube, needing step by step cues for sequencing basic ADL task        Exercises      Shoulder Instructions       General Comments VSS during session    Pertinent Vitals/ Pain       Pain Assessment Pain Assessment: Faces Pain Score: 4  Faces Pain Scale: Hurts Gebhardt more Pain Location: back pain Pain Descriptors / Indicators: Discomfort, Grimacing Pain Intervention(s): Monitored during session, Repositioned  Home Living Family/patient expects to be discharged to:: Shelter/Homeless                                 Additional Comments: encounter in ED states pt is homeless. multiple attempts to ask pt about plan for location after d/c, did not provide any information, stated "thats a great question" when inquiring about  plan      Prior Functioning/Environment              Frequency  Min 2X/week        Progress Toward Goals  OT Goals(current goals can now be found in the care plan section)     Acute Rehab OT Goals Patient Stated Goal: none stated OT Goal Formulation: With patient Time For Goal Achievement: 06/03/22 Potential to Achieve Goals: Good ADL Goals Pt Will Perform Grooming: with modified independence;standing Pt Will Perform Upper Body Dressing: with min guard assist;sitting Pt Will Perform Lower Body Dressing: with min guard assist;sitting/lateral leans;sit to/from stand Pt Will Transfer to Toilet: with supervision;regular height toilet;ambulating Additional ADL Goal #1: Pt will perform 2 step ADL task with min cues  Plan      Co-evaluation                 AM-PAC OT "6 Clicks" Daily Activity     Outcome Measure   Help from another person eating meals?: A Richart Help from another person taking care of personal grooming?: A Angelini Help from another person toileting, which includes using toliet, bedpan, or urinal?: Total Help from another person bathing (including washing, rinsing, drying)?: A Lot Help from another person to put on and taking off regular upper body clothing?: A Lot Help from another person to put on and taking off regular lower body clothing?: A Lot 6 Click Score: 13    End of Session Equipment Utilized During Treatment: Gait belt;Rolling walker (2 wheels)  OT Visit Diagnosis: Unsteadiness on feet (R26.81);Other abnormalities of gait and  mobility (R26.89);Muscle weakness (generalized) (M62.81)   Activity Tolerance Patient tolerated treatment well   Patient Left in bed;with call bell/phone within reach;with bed alarm set;with restraints reapplied   Nurse Communication Mobility status        Time: 2423-5361 OT Time Calculation (min): 35 min  Charges: OT General Charges $OT Visit: 1 Visit OT Evaluation $OT Eval Moderate Complexity: 1  [address], OTD, OTR/L Acute Rehab [phone] 05/20/2022, 1:44 PM

## 2022-05-20 NOTE — Anesthesia Preprocedure Evaluation (Signed)
Anesthesia Evaluation  Patient identified by MRN, date of birth, ID band Patient confused    Reviewed: Allergy & Precautions, H&P , NPO status , Patient's Chart, lab work & pertinent test results, reviewed documented beta blocker date and time   Airway Mallampati: III  TM Distance: >3 FB Neck ROM: Full    Dental no notable dental hx. (+) Edentulous Upper, Poor Dentition, Partial Lower, Dental Advisory Given   Pulmonary neg pulmonary ROS, PE   Pulmonary exam normal breath sounds clear to auscultation       Cardiovascular Exercise Tolerance: Good hypertension, Pt. on medications and Pt. on home beta blockers +CHF and + DOE   Rhythm:Regular Rate:Normal     Neuro/Psych   Anxiety Depression    negative neurological ROS     GI/Hepatic hiatal hernia,,,(+)     substance abuse  alcohol use  Endo/Other  negative endocrine ROS    Renal/GU negative Renal ROS  negative genitourinary   Musculoskeletal   Abdominal   Peds  Hematology  (+) Blood dyscrasia, anemia   Anesthesia Other Findings   Reproductive/Obstetrics negative OB ROS                             Anesthesia Physical Anesthesia Plan  ASA: 3  Anesthesia Plan: General   Post-op Pain Management: Minimal or no pain anticipated   Induction: Intravenous, Rapid sequence and Cricoid pressure planned  PONV Risk Score and Plan: 1 and Propofol infusion and TIVA  Airway Management Planned: Oral ETT  Additional Equipment:   Intra-op Plan:   Post-operative Plan: Extubation in OR  Informed Consent: I have reviewed the patients History and Physical, chart, labs and discussed the procedure including the risks, benefits and alternatives for the proposed anesthesia with the patient or authorized representative who has indicated his/her understanding and acceptance.     Dental advisory given  Plan Discussed with: CRNA  Anesthesia Plan  Comments:         Anesthesia Quick Evaluation

## 2022-05-20 NOTE — Progress Notes (Signed)
ANTICOAGULATION CONSULT NOTE - Follow-up  Pharmacy Consult for Xarelto>>heparin Indication: atrial fibrillation  Allergies  Allergen Reactions   Other Itching and Other (See Comments)    Seasonal allergies- Itchy eyes, runny nose, congestion   Poison Ivy Extract Rash    Patient Measurements: Height: '5\' 10"'$  (177.8 cm) Weight: 68.3 kg (150 lb 9.2 oz) IBW/kg (Calculated) : 73 Heparin Dosing Weight: 64.5kg  Vital Signs: Temp: 98 F (36.7 C) (11/01 1100) Temp Source: Oral (11/01 1100) BP: 122/97 (11/01 1100) Pulse Rate: 91 (11/01 1100)  Labs: Recent Labs    05/19/22 0345 05/19/22 0530 05/19/22 1103 05/20/22 0310 05/20/22 1357  HGB 9.8*  --   --  8.7*  --   HCT 28.9*  --   --  25.3*  --   PLT 172  --   --  141*  --   APTT  --   --  62* 54* 74*  LABPROT  --   --  35.3*  --   --   INR  --   --  3.6*  --   --   HEPARINUNFRC  --   --   --  >1.10*  --   CREATININE 0.89  --   --  0.80 0.74  TROPONINIHS 33* 28*  --   --   --      Estimated Creatinine Clearance: 87.7 mL/min (by C-G formula based on SCr of 0.74 mg/dL).   Medical History: Past Medical History:  Diagnosis Date   Actinic keratosis 11/27/2016   Alcohol abuse with alcohol-induced mood disorder ([place]) 07/18/2018   Alcohol use disorder, severe, dependence ([place]) 09/16/2006   05/22/2015 Argumentative, belligerent and verbally abusive to staff per his note from [facility])    Anxiety state 04/25/2018   Chronic low back pain 09/16/2006   Followed by NS.  Per his chart from [place], he fell off two storyhouse roof while cleaning his brother's gutters and sustained compression fracture of L3, L4 and L5 and fracture of right transverse process at L3 and nondisplaced fracture of left glenoid rim many years ago. He had multiple imaging in [place] including Lumbar MRI in 2016 which showed moderate to severe spinal canal stenos   Delirium tremens ([place]) 09/01/2017   Depression    Dry eye 11/23/2016    Hiatal hernia 09/14/2016   Status Collis gastroplasty and Nissen's fundoplication on 08/65/7846 in [place]   History of delirium tremons 07/22/2020   DTs during 10/2016 08/2017. And 12/2017 admissions    History of pulmonary embolus (PE) 11/02/2016   Unprovoked 11/01/2016. Xarelto from 11/01/16 to 09/08/2017.   Hydrocele    Surgically corrected   Hypertension    Hypoalbuminemia due to protein-calorie malnutrition ([place]) 07/22/2020   Lumbar compression fracture ([facility])    Multiple rib fractures 07/22/2019   Left rib details XR: left ribs demonstrate multiple remote rib   Pancytopenia ([place])    Rhabdomyolysis 07/22/2020   Skin cancer    exciced 2017   Tinea versicolor 06/08/2013   Tobacco use disorder 07/22/2020   Tooth infection 04/25/2018   Trigger finger, acquired 11/18/2012   Ulnar tunnel syndrome of right wrist 11/08/2012    Medications:  Scheduled:   chlordiazePOXIDE  25 mg Oral QID   Followed by   Derrill Memo ON 05/21/2022] chlordiazePOXIDE  25 mg Oral TID   Followed by   Derrill Memo ON 05/22/2022] chlordiazePOXIDE  25 mg Oral BH-qamhs   Followed by   Derrill Memo ON 05/23/2022] chlordiazePOXIDE  25 mg Oral  Daily   clotrimazole   Topical BID   folic acid  1 mg Oral Daily   metoprolol succinate  12.5 mg Oral Daily   multivitamin with minerals  1 tablet Oral Daily   PHENObarbital  97.5 mg Intravenous Q8H   Followed by   [START ON 05/21/2022] PHENObarbital  65 mg Intravenous Q8H   Followed by   Derrill Memo ON 05/23/2022] PHENObarbital  32.5 mg Intravenous Q8H   rosuvastatin  10 mg Oral Daily   thiamine  100 mg Oral Daily   Infusions:   amiodarone 30 mg/hr (05/20/22 0500)   cefTRIAXone (ROCEPHIN)  IV 2 g (05/20/22 1006)   heparin 900 Units/hr (05/20/22 0620)   vancomycin 750 mg (05/20/22 1147)    Assessment: 66 YO male presenting to ED on 10/31 with acute back pain and Afib with RVR. Patient is on rivaroxaban PTA for afib but last dose outside of hospital is unknown. Rivaroxaban was re-started during  hospital admission with his last dose being 10/31 @ 0528. Patient may require procedure(s) given poor clinical status and coin-like object in distal esophagus on imaging--pharmacy was consulted for heparin dosing. Rivaroxaban has now been discontinued and will start heparin drip 24hrs after last dose of rivaroxaban.   aPTT is therapeutic at 0.74. EGD scheduled tomorrow (11/2) AM at 0830 and heparin infusion is scheduled to stop at 0400 before the procedure.  No issues/interruptions with infusion or bleeding noted per RN.   Goal of Therapy:  Heparin level 0.3-0.7 units/ml aPTT 66-102 seconds Monitor platelets by anticoagulation protocol: Yes   Plan:  Continue heparin gtt at 900 units/hr  F/u hold heparin 11/2 at 0400  F/u re-initiation post procedure and/or transition to oral AC  Monitor CBC and s/sx of bleeding daily   Francena Hanly, PharmD Pharmacy Resident  05/20/2022 2:57 PM

## 2022-05-20 NOTE — Plan of Care (Signed)
  Problem: Safety: Goal: Non-violent Restraint(s) Outcome: Not Progressing   Problem: Health Behavior/Discharge Planning: Goal: Ability to manage health-related needs will improve Outcome: Progressing   Problem: Clinical Measurements: Goal: Ability to maintain clinical measurements within normal limits will improve Outcome: Progressing Goal: Will remain free from infection Outcome: Progressing Goal: Diagnostic test results will improve Outcome: Progressing Goal: Respiratory complications will improve Outcome: Progressing Goal: Cardiovascular complication will be avoided Outcome: Progressing   Problem: Activity: Goal: Risk for activity intolerance will decrease Outcome: Progressing   Problem: Nutrition: Goal: Adequate nutrition will be maintained Outcome: Not Progressing   Problem: Coping: Goal: Level of anxiety will decrease Outcome: Progressing   Problem: Elimination: Goal: Will not experience complications related to bowel motility Outcome: Progressing Goal: Will not experience complications related to urinary retention Outcome: Progressing   Problem: Pain Managment: Goal: General experience of comfort will improve Outcome: Progressing   Problem: Safety: Goal: Ability to remain free from injury will improve Outcome: Progressing   Problem: Skin Integrity: Goal: Risk for impaired skin integrity will decrease Outcome: Progressing

## 2022-05-20 NOTE — Progress Notes (Addendum)
Phenobariital- .75 mL given to patient. .25 mL wasted with Dillard Cannon RN

## 2022-05-21 ENCOUNTER — Inpatient Hospital Stay (HOSPITAL_COMMUNITY): Payer: 59 | Admitting: Anesthesiology

## 2022-05-21 ENCOUNTER — Encounter (HOSPITAL_COMMUNITY)
Admission: EM | Disposition: A | Discharge status: Skilled Nursing Facility | Payer: Self-pay | Source: Home / Self Care | Attending: Family Medicine

## 2022-05-21 ENCOUNTER — Encounter (HOSPITAL_COMMUNITY): Payer: Self-pay | Admitting: Family Medicine

## 2022-05-21 DIAGNOSIS — K2100 Gastro-esophageal reflux disease with esophagitis, without bleeding: Secondary | ICD-10-CM

## 2022-05-21 DIAGNOSIS — R6520 Severe sepsis without septic shock: Secondary | ICD-10-CM | POA: Diagnosis not present

## 2022-05-21 DIAGNOSIS — K2970 Gastritis, unspecified, without bleeding: Secondary | ICD-10-CM

## 2022-05-21 DIAGNOSIS — K222 Esophageal obstruction: Secondary | ICD-10-CM

## 2022-05-21 DIAGNOSIS — F418 Other specified anxiety disorders: Secondary | ICD-10-CM

## 2022-05-21 DIAGNOSIS — K449 Diaphragmatic hernia without obstruction or gangrene: Secondary | ICD-10-CM

## 2022-05-21 DIAGNOSIS — A419 Sepsis, unspecified organism: Secondary | ICD-10-CM | POA: Diagnosis not present

## 2022-05-21 DIAGNOSIS — K315 Obstruction of duodenum: Secondary | ICD-10-CM

## 2022-05-21 DIAGNOSIS — T18198A Other foreign object in esophagus causing other injury, initial encounter: Secondary | ICD-10-CM

## 2022-05-21 DIAGNOSIS — G9341 Metabolic encephalopathy: Secondary | ICD-10-CM | POA: Diagnosis not present

## 2022-05-21 DIAGNOSIS — I480 Paroxysmal atrial fibrillation: Secondary | ICD-10-CM | POA: Diagnosis not present

## 2022-05-21 DIAGNOSIS — T18108A Unspecified foreign body in esophagus causing other injury, initial encounter: Secondary | ICD-10-CM

## 2022-05-21 HISTORY — PX: ESOPHAGOGASTRODUODENOSCOPY (EGD) WITH PROPOFOL: SHX5813

## 2022-05-21 HISTORY — PX: FOREIGN BODY REMOVAL: SHX962

## 2022-05-21 HISTORY — PX: BIOPSY: SHX5522

## 2022-05-21 LAB — CBC
HCT: 24.8 % — ABNORMAL LOW (ref 39.0–52.0)
Hemoglobin: 8.3 g/dL — ABNORMAL LOW (ref 13.0–17.0)
MCH: 35.8 pg — ABNORMAL HIGH (ref 26.0–34.0)
MCHC: 33.5 g/dL (ref 30.0–36.0)
MCV: 106.9 fL — ABNORMAL HIGH (ref 80.0–100.0)
Platelets: 153 10*3/uL (ref 150–400)
RBC: 2.32 MIL/uL — ABNORMAL LOW (ref 4.22–5.81)
RDW: 17.1 % — ABNORMAL HIGH (ref 11.5–15.5)
WBC: 7.4 10*3/uL (ref 4.0–10.5)
nRBC: 0 % (ref 0.0–0.2)

## 2022-05-21 LAB — COMPREHENSIVE METABOLIC PANEL
ALT: 42 U/L (ref 0–44)
AST: 128 U/L — ABNORMAL HIGH (ref 15–41)
Albumin: 2.2 g/dL — ABNORMAL LOW (ref 3.5–5.0)
Alkaline Phosphatase: 87 U/L (ref 38–126)
Anion gap: 12 (ref 5–15)
BUN: 16 mg/dL (ref 8–23)
CO2: 20 mmol/L — ABNORMAL LOW (ref 22–32)
Calcium: 8 mg/dL — ABNORMAL LOW (ref 8.9–10.3)
Chloride: 103 mmol/L (ref 98–111)
Creatinine, Ser: 0.75 mg/dL (ref 0.61–1.24)
GFR, Estimated: >60 mL/min (ref >60)
Glucose, Bld: 87 mg/dL (ref 70–99)
Potassium: 3.3 mmol/L — ABNORMAL LOW (ref 3.5–5.1)
Sodium: 135 mmol/L (ref 135–145)
Total Bilirubin: 0.9 mg/dL (ref 0.3–1.2)
Total Protein: 5.5 g/dL — ABNORMAL LOW (ref 6.5–8.1)

## 2022-05-21 LAB — C DIFFICILE QUICK SCREEN W PCR REFLEX
C Diff antigen: NEGATIVE
C Diff interpretation: NOT DETECTED
C Diff toxin: NEGATIVE

## 2022-05-21 LAB — MRSA NEXT GEN BY PCR, NASAL: MRSA by PCR Next Gen: NOT DETECTED

## 2022-05-21 LAB — PROTIME-INR
INR: 1.2 (ref 0.8–1.2)
Prothrombin Time: 14.6 seconds (ref 11.4–15.2)

## 2022-05-21 LAB — HEPATITIS A ANTIBODY, TOTAL: hep A Total Ab: NONREACTIVE

## 2022-05-21 LAB — MAGNESIUM
Magnesium: 1.6 mg/dL — ABNORMAL LOW (ref 1.7–2.4)
Magnesium: 2 mg/dL (ref 1.7–2.4)

## 2022-05-21 LAB — HEPATITIS B SURFACE ANTIGEN: Hepatitis B Surface Ag: NONREACTIVE

## 2022-05-21 LAB — PHOSPHORUS
Phosphorus: 1.8 mg/dL — ABNORMAL LOW (ref 2.5–4.6)
Phosphorus: 2.7 mg/dL (ref 2.5–4.6)

## 2022-05-21 LAB — HEPATITIS B SURFACE ANTIBODY,QUALITATIVE: Hep B S Ab: NONREACTIVE

## 2022-05-21 LAB — HEPATITIS C ANTIBODY: HCV Ab: NONREACTIVE

## 2022-05-21 LAB — LIPASE, BLOOD: Lipase: 55 U/L — ABNORMAL HIGH (ref 11–51)

## 2022-05-21 SURGERY — ESOPHAGOGASTRODUODENOSCOPY (EGD) WITH PROPOFOL
Anesthesia: Monitor Anesthesia Care

## 2022-05-21 MED ORDER — SUCCINYLCHOLINE CHLORIDE 200 MG/10ML IV SOSY
PREFILLED_SYRINGE | INTRAVENOUS | Status: DC | PRN
  Administered 2022-05-21: 80 mg via INTRAVENOUS

## 2022-05-21 MED ORDER — AMIODARONE HCL 200 MG PO TABS
400.0000 mg | ORAL_TABLET | Freq: Two times a day (BID) | ORAL | Status: AC
  Administered 2022-05-21 – 2022-05-25 (×10): 400 mg via ORAL
  Filled 2022-05-21 (×10): qty 2

## 2022-05-21 MED ORDER — POTASSIUM CHLORIDE CRYS ER 20 MEQ PO TBCR: 20.0000 meq | EXTENDED_RELEASE_TABLET | ORAL | Status: DC

## 2022-05-21 MED ORDER — DEXMEDETOMIDINE HCL IN NACL 80 MCG/20ML IV SOLN
INTRAVENOUS | Status: DC | PRN
  Administered 2022-05-21: 4 ug via BUCCAL
  Administered 2022-05-21: 8 ug via BUCCAL
  Administered 2022-05-21: 4 ug via BUCCAL

## 2022-05-21 MED ORDER — MAGNESIUM SULFATE 2 GM/50ML IV SOLN
2.0000 g | Freq: Once | INTRAVENOUS | Status: AC
  Administered 2022-05-21: 2 g via INTRAVENOUS
  Filled 2022-05-21: qty 50

## 2022-05-21 MED ORDER — PANTOPRAZOLE SODIUM 40 MG IV SOLR
40.0000 mg | Freq: Two times a day (BID) | INTRAVENOUS | Status: DC
  Administered 2022-05-21 – 2022-05-29 (×15): 40 mg via INTRAVENOUS
  Filled 2022-05-21 (×17): qty 10

## 2022-05-21 MED ORDER — DEXAMETHASONE SODIUM PHOSPHATE 10 MG/ML IJ SOLN
INTRAMUSCULAR | Status: DC | PRN
  Administered 2022-05-21: 10 mg via INTRAVENOUS

## 2022-05-21 MED ORDER — PROPOFOL 500 MG/50ML IV EMUL
INTRAVENOUS | Status: DC | PRN
  Administered 2022-05-21: 90 ug/kg/min via INTRAVENOUS

## 2022-05-21 MED ORDER — AMIODARONE HCL 200 MG PO TABS
200.0000 mg | ORAL_TABLET | Freq: Two times a day (BID) | ORAL | Status: AC
  Administered 2022-05-26 – 2022-06-01 (×14): 200 mg via ORAL
  Filled 2022-05-21 (×14): qty 1

## 2022-05-21 MED ORDER — PHENYLEPHRINE HCL-NACL 20-0.9 MG/250ML-% IV SOLN
INTRAVENOUS | Status: DC | PRN
  Administered 2022-05-21: 30 ug/min via INTRAVENOUS

## 2022-05-21 MED ORDER — POTASSIUM CHLORIDE CRYS ER 20 MEQ PO TBCR
20.0000 meq | EXTENDED_RELEASE_TABLET | Freq: Once | ORAL | Status: AC
  Administered 2022-05-21: 20 meq via ORAL
  Filled 2022-05-21: qty 1

## 2022-05-21 MED ORDER — LIDOCAINE 2% (20 MG/ML) 5 ML SYRINGE
INTRAMUSCULAR | Status: DC | PRN
  Administered 2022-05-21: 80 mg via INTRAVENOUS

## 2022-05-21 MED ORDER — LACTATED RINGERS IV SOLN: INTRAVENOUS | Status: DC | PRN

## 2022-05-21 MED ORDER — RIVAROXABAN 20 MG PO TABS
20.0000 mg | ORAL_TABLET | Freq: Every day | ORAL | Status: DC
  Administered 2022-05-22 – 2022-06-01 (×11): 20 mg via ORAL
  Filled 2022-05-21 (×14): qty 1

## 2022-05-21 MED ORDER — POTASSIUM PHOSPHATES 15 MMOLE/5ML IV SOLN
15.0000 mmol | Freq: Once | INTRAVENOUS | Status: AC
  Administered 2022-05-21: 15 mmol via INTRAVENOUS

## 2022-05-21 MED ORDER — POTASSIUM PHOSPHATES 15 MMOLE/5ML IV SOLN
15.0000 mmol | Freq: Once | INTRAVENOUS | Status: DC
  Administered 2022-05-21: 15 mmol via INTRAVENOUS
  Filled 2022-05-21: qty 5

## 2022-05-21 MED ORDER — PROPOFOL 10 MG/ML IV BOLUS
INTRAVENOUS | Status: DC | PRN
  Administered 2022-05-21: 100 mg via INTRAVENOUS

## 2022-05-21 MED ORDER — ONDANSETRON HCL 4 MG/2ML IJ SOLN
INTRAMUSCULAR | Status: DC | PRN
  Administered 2022-05-21: 4 mg via INTRAVENOUS

## 2022-05-21 MED ORDER — SUCRALFATE 1 GM/10ML PO SUSP
1.0000 g | Freq: Three times a day (TID) | ORAL | Status: DC
  Administered 2022-05-21 – 2022-06-02 (×42): 1 g via ORAL
  Filled 2022-05-21 (×42): qty 10

## 2022-05-21 SURGICAL SUPPLY — 15 items

## 2022-05-21 NOTE — Transfer of Care (Signed)
Immediate Anesthesia Transfer of Care Note  Patient: Anthony [name]  Procedure(s) Performed: ESOPHAGOGASTRODUODENOSCOPY (EGD) WITH PROPOFOL BIOPSY Balloon dilation wire-guided FOREIGN BODY REMOVAL  Patient Location: PACU  Anesthesia Type:General  Level of Consciousness: drowsy  Airway & Oxygen Therapy: Patient Spontanous Breathing and Patient connected to face mask oxygen  Post-op Assessment: Report given to RN and Post -op Vital signs reviewed and stable  Post vital signs: Reviewed and stable  Last Vitals:  Vitals Value Taken Time  BP 101/75 05/21/22 1200  Temp 36.8 C 05/21/22 1157  Pulse 69 05/21/22 1200  Resp 26 05/21/22 1200  SpO2 100 % 05/21/22 1200  Vitals shown include unvalidated device data.  Last Pain:  Vitals:   05/21/22 1157  TempSrc:   PainSc: Asleep      Patients Stated Pain Goal: 0 (72/55/00 1642)  Complications: No notable events documented.

## 2022-05-21 NOTE — Anesthesia Procedure Notes (Signed)
Procedure Name: Intubation Date/Time: 05/21/2022 11:22 AM  Performed by: Lorie Phenix, CRNAPre-anesthesia Checklist: Patient identified, Emergency Drugs available, Suction available and Patient being monitored Patient Re-evaluated:Patient Re-evaluated prior to induction Oxygen Delivery Method: Circle system utilized Preoxygenation: Pre-oxygenation with 100% oxygen Induction Type: IV induction, Rapid sequence and Cricoid Pressure applied Laryngoscope Size: Mac and 4 Grade View: Grade I Tube type: Oral Number of attempts: 1 Airway Equipment and Method: Stylet Placement Confirmation: ETT inserted through vocal cords under direct vision, positive ETCO2 and breath sounds checked- equal and bilateral Secured at: 23 cm Tube secured with: Tape Dental Injury: Teeth and Oropharynx as per pre-operative assessment

## 2022-05-21 NOTE — Op Note (Signed)
[facility] Patient Name: Anthony [name] Procedure Date : 05/21/2022 MRN: [id number] Attending MD: [name] , MD, [id number] Date of Birth: 1956/02/09 CSN: [id number] Age: 66 Admit Type: Inpatient Procedure:                Upper GI endoscopy Indications:              Foreign body in the esophagus Providers:                [name], MD, Velva Harman, RN, Fransico Setters                            [place], Technician Referring MD:              Medicines:                General Anesthesia Complications:            No immediate complications. Estimated Blood Loss:     Estimated blood loss: none. Procedure:                Pre-Anesthesia Assessment:                           - Prior to the procedure, a History and Physical                            was performed, and patient medications and                            allergies were reviewed. The patient's tolerance of                            previous anesthesia was also reviewed. The risks                            and benefits of the procedure and the sedation                            options and risks were discussed with the patient.                            All questions were answered, and informed consent                            was obtained. Prior Anticoagulants: The patient has                            taken heparin, last dose was day of procedure. ASA                            Grade Assessment: III - A patient with severe                            systemic disease. After reviewing the risks and  benefits, the patient was deemed in satisfactory                            condition to undergo the procedure.                           After obtaining informed consent, the endoscope was                            passed under direct vision. Throughout the                            procedure, the patient's blood pressure, pulse, and                            oxygen saturations were  monitored continuously. The                            [id number]-H190 ([id number]) Olympus endoscope was introduced                            through the mouth, and advanced to the second part                            of duodenum. The upper GI endoscopy was                            accomplished without difficulty. The patient                            tolerated the procedure well. Scope In: Scope Out: Findings:      A Quarter coin was found in the lower esophagus. Removal was       accomplished with a rat-toothed forceps. Estimated blood loss: none.      LA Grade C-D esophagitis with no bleeding was found in the lower third       of the esophagus. Biopsies were taken with a cold forceps for histology.      One benign-appearing, intrinsic moderate (circumferential scarring or       stenosis; an endoscope may pass) stenosis was found at the       gastroesophageal junction. This stenosis measured 1.2 cm (inner       diameter). The stenosis was traversed. A TTS dilator was passed through       the scope. Dilation with a 15-16.5-18 mm balloon dilator was performed       to 16.5 mm. The dilation site was examined and showed mild mucosal       disruption and moderate improvement in luminal narrowing.      A 2 cm hiatal hernia was present.      Evidence of a fundoplication was found in the cardia.      Diffuse moderate inflammation characterized by erosions, erythema and       granularity was found in the entire examined stomach. Biopsies were       taken with a cold forceps for histology.      An acquired benign-appearing, intrinsic mild stenosis was found in  the       D1/D2 junction and was traversed. Biopsies were taken with a cold       forceps for histology. This could easily be traversed. A second portion       of the duodenum was normal. Impression:               - Quarter coin was found in the esophagus. Removal                            was successful.                           - Distal  eso stricture with reflux esophagitis                            (Biopsied and dilated).                           - 2 cm hiatal hernia.                           - A fundoplication was found.                           - Gastritis. Biopsied.                           - Acquired duodenal stenosis. Biopsied. Recommendation:           - Return patient to hospital ward for ongoing care.                           - Full liquid diet. Advance to soft diet by AM                           - Use Protonix (pantoprazole) 40 mg IV BID. Once                            able to take p.o., can use Protonix or omeprazole                            (d/t cost) BID.                           - Avoid NSAIDs                           - Stop ETOH                           - FU GI as out pt in 6-8 weeks.                           - Resume heparin in 6 hrs.                           - The findings and recommendations were discussed  with the patient's brother. Procedure Code(s):        --- Professional ---                           [phone], Esophagogastroduodenoscopy, flexible,                            transoral; with removal of foreign body(s)                           43249, Esophagogastroduodenoscopy, flexible,                            transoral; with transendoscopic balloon dilation of                            esophagus (less than 30 mm diameter)                           43239, 59, Esophagogastroduodenoscopy, flexible,                            transoral; with biopsy, single or multiple Diagnosis Code(s):        --- Professional ---                           [phone], Other foreign object in esophagus causing                            other injury, initial encounter                           K21.00, Gastro-esophageal reflux disease with                            esophagitis, without bleeding                           K22.2, Esophageal obstruction                            K44.9, Diaphragmatic hernia without obstruction or                            gangrene                           Z98.890, Other specified postprocedural states                           K29.70, Gastritis, unspecified, without bleeding                           K31.5, Obstruction of duodenum                           T18.108A, Unspecified foreign body in esophagus  causing other injury, initial encounter CPT copyright 2022 [employer]. All rights reserved. The codes documented in this report are preliminary and upon coder review may  be revised to meet current compliance requirements. [name], MD 05/21/2022 11:58:24 AM This report has been signed electronically. Number of Addenda: 0

## 2022-05-21 NOTE — Progress Notes (Addendum)
Patient needs ASAP EGD. I and others have tried to reach POA and other family members No one is returning calls. Will need to perform emergently with two MD consents. Anesthesia prefers not to sign. I have reached out to attending MD , Dr. Ardelia Mems and she will sign.

## 2022-05-21 NOTE — Progress Notes (Signed)
Daily Progress Note Intern Pager: [phone]  Patient name: Anthony [name] Medical record number: [id number] Date of birth: 01-25-1956 Age: 66 y.o. Gender: male  Primary Care Provider: Jacelyn Grip, MD Consultants: GI, cardiology Code Status: Full code  Pt Overview and Major Events to Date:  10/30 Admitted to [facility]  Assessment and Plan:  Arley A Knoch is a 66 y.o. male presenting with back pain, SOB, and BLE swelling.  Currently being treated for acute on chronic diastolic CHF, A-fib, alcohol withdrawal.  Pertinent PMH/PSH includes A-fib, HFpEF, hypertension, alcohol use disorder.  * Sepsis ([place]) Afebrile overnight. HR controlled. WBC stable.  Unclear source of infection: CT chest showed possible pneumonia and CT AP showed possible stranding around the gallbladder.  - cont Rocephin (10/31 - ) - cont Azithromycin (11/1 - ) - monitor fever curve, if temp >100.4 re-culture  - f/u blood and urine cx - consider HIDA scan to r/o biliary cause of sepsis   Alcohol use disorder, severe, dependence ([place]) Last drink 10/30.  CIWA elevated overnight.  Patient required soft restraints for his and staff safety.  On assessment this morning he is still agitated and not very redirectable.  With patient still in acute alcohol withdrawal continue soft restraints.  -Continue soft restraints for patient's safety, reassess after GI procedure this morning to deal escalate to mittens.  - CIWAs q2h  - Cont Phenobarb taper with 1 mg Ativan q4h prn - cont Librium taper  - Thiamine, folate, MVI - low threshold for CCM consult/precedex gtt  Acute on chronic diastolic CHF (congestive heart failure) (HCC) Mild trace LE edema on exam, no crackles on exam.  - consider restarting home lasix 20 mg BID - 1500cc urine output since admission - Continue home metoprolol 12.5 mg daily - Strict I/Os, daily weights  Atrial fibrillation with RVR (HCC) Rate controled.  -Cardiology consulted, appreciate recommendations   -D/C amio drip to transition to PO 400 mg BID x5 days, 200 mg BID x7 day -Monitor electrolytes (Keep Mg>2 and K>4)  Foreign body in esophagus CXR shows likely coin in distal esophagus. Patient denies swallowing coin or other foreign body to his knowledge. - GI consulted, appreciate recommendations  - cont heparin  - planning EGD today   Loose stools x3 overnight, a febrile, WBC stable.  C. difficile testing negative.  -Continue fecal management system  Back pain Acute on chronic pain. No neuro deficits on exam. Has been sleeping on benches outside which he says has exacerbated his pain. He usually takes a muscle relaxer and tylenol PM, though it has been a couple days since his last dose. Suspect pain 2/2 chronic compression fractures at L2-4.  -Tylenol, robaxin for pain -Can consider lidocaine patch and/or x-ray if not controlled -PT/OT  Hypomagnesemia 1.6, re-pleated.   Macrocytic anemia Chronic, stable. Likely 2/2 alcohol use. -Continue folate, thiamine, MVI as above -daily CBC  HTN (hypertension) BP Stable overnight.  -Monitor BP -Cont metoprolol   Poor social situation Currently unhoused. Likely contributing to lack of follow up with PCP and inability to obtain medications, leading to recurrent hospitalizations and chronic disease exacerbations. -TOC consult for dispo planning    FEN/GI: Heart healthy PPx: Xarelto Dispo: pending clinical improvement . Barriers include ongoing alcohol withdrawal.   Subjective:  CIWAs elevated overnight.  Soft restraints were placed for patient's safety and staff safety.  Soft restraints on and present this morning.  Patient is able to answer questions appropriately but appears to be fidgeting.  With agitation overnight requiring soft restraints and ongoing acute alcohol withdrawal, will continue soft restraints and will reassess for removal to mittens after GI procedure this morning.  Objective: Temp:  [97.9 F (36.6 C)-100.3 F  (37.9 C)] 98.1 F (36.7 C) (11/02 0738) Pulse Rate:  [84-99] 86 (11/02 0738) Resp:  [15-28] 28 (11/02 0738) BP: (92-125)/(66-97) 119/85 (11/02 0800) SpO2:  [97 %-100 %] 97 % (11/02 0738) Weight:  [68.1 kg] 68.1 kg (11/02 0531) Physical Exam: General: Chronically ill-appearing no acute distress Cardiovascular: Regular rate, irregularly regular rhythm, no murmur Respiratory: Clear, no wheezing, no crackles Abdomen: Soft, nontender, nondistended Extremities: Trace peripheral edema bilaterally, no concerns for cellulitis  Laboratory: Most recent CBC Lab Results  Component Value Date   WBC 7.4 05/21/2022   HGB 8.3 (L) 05/21/2022   HCT 24.8 (L) 05/21/2022   MCV 106.9 (H) 05/21/2022   PLT 153 05/21/2022   Most recent BMP    Latest Ref Rng & Units 05/21/2022    5:44 AM  BMP  Glucose 70 - 99 mg/dL 87   BUN 8 - 23 mg/dL 16   Creatinine 0.61 - 1.24 mg/dL 0.75   Sodium 135 - 145 mmol/L 135   Potassium 3.5 - 5.1 mmol/L 3.3   Chloride 98 - 111 mmol/L 103   CO2 22 - 32 mmol/L 20   Calcium 8.9 - 10.3 mg/dL 8.0     Other pertinent labs C diff culture negative    Imaging/Diagnostic Tests: CT AP: - Suspected foreign body distal esophagus - Basilar airspace disease on the right with nodular features suspicious for pneumonia - Small right-sided pleural effusion and trace left pleural effusion - Mild distention of gallbladder, mild stranding and fluid adjacent to the gallbladder in the setting of generalized edema, consider HIDA biliary for further evaluation is warranted - Trace fluid in the pelvis - Colonic diverticulosis without evidence of acute diverticulitis - Aortic atherosclerosis  Ultrasound abdomen: 10/31 - Echogenicity of liver is increased - Gallbladder: No gallstones or wall thickening, no sonographic Murphy sign noted by sonographer  Darci Current, DO 05/21/2022, 8:44 AM  PGY-1, [place] Intern pager: [phone], text pages welcome Secure chat  group [facility] Teaching Service

## 2022-05-21 NOTE — Anesthesia Postprocedure Evaluation (Signed)
Anesthesia Post Note  Patient: Anthony [name]  Procedure(s) Performed: ESOPHAGOGASTRODUODENOSCOPY (EGD) WITH PROPOFOL BIOPSY Balloon dilation wire-guided FOREIGN BODY REMOVAL     Patient location during evaluation: PACU Anesthesia Type: General Level of consciousness: awake and alert Pain management: pain level controlled Vital Signs Assessment: post-procedure vital signs reviewed and stable Respiratory status: spontaneous breathing, nonlabored ventilation, respiratory function stable and patient connected to nasal cannula oxygen Cardiovascular status: blood pressure returned to baseline and stable Postop Assessment: no apparent nausea or vomiting Anesthetic complications: no  No notable events documented.  Last Vitals:  Vitals:   05/21/22 1226 05/21/22 1309  BP: 106/79 107/66  Pulse: 83 84  Resp: (!) 21 (!) 27  Temp: 36.8 C 36.6 C  SpO2: 100% 99%    Last Pain:  Vitals:   05/21/22 1309  TempSrc: Oral  PainSc:                  Neka Bise,W. EDMOND

## 2022-05-21 NOTE — TOC Progression Note (Addendum)
Transition of Care [facility]) - Progression Note    Patient Details  Name: Anthony [name] MRN: [id number] Date of Birth: 19-Jun-1956  Transition of Care [facility] [place]) CM/SW [place], [place] Phone Number: 05/21/2022, 2:04 PM  Clinical Narrative:     Update-2:18pm- CSW received callback from patients brother Anthony [name] regarding dc plan for patient. CSW informed patients brother CSW following to speak with patient when better oriented regarding PT/OT recommendations.CSW discussed possible barriers to SNF placement if patient agreeable. Patients brother request for CSW to follow back up with him after CSW is able to speak with patient regarding his dc plan.All questions answered. No further questions reported at this time.Patients brother thanked CSW.  CSW will continue to follow.  CSW and CM informed by MD patients brother Anthony [name] request phone call. CSW called patients brother Anthony [name]. CSW LVM. CSW awaiting callback. CSW following to speak with patient when better oriented to discuss PT/OT recommendations/patients dc plan and consult for substance resources, food resources, and shelter resources. CSW will continue to follow and assist with patients dc planning needs.       Expected Discharge Plan and Services                                                 Social Determinants of Health (SDOH) Interventions    Readmission Risk Interventions    10/08/2020    2:48 PM  Readmission Risk Prevention Plan  Transportation Screening Complete  PCP or Specialist Appt within 3-5 Days Complete  HRI or [place] Complete  Social Work Consult for [place] Planning/Counseling Complete  Palliative Care Screening Complete  Medication Review Press photographer) Complete

## 2022-05-21 NOTE — Interval H&P Note (Signed)
History and Physical Interval Note:  05/21/2022 9:58 AM  Anthony [name]  has presented today for surgery, with the diagnosis of Patient has what looks like a coin in the distal esophagus on 2 separate x-rays this week.  Needs evaluation.  The various methods of treatment have been discussed with the patient and family. After consideration of risks, benefits and other options for treatment, the patient has consented to  Procedure(s): ESOPHAGOGASTRODUODENOSCOPY (EGD) WITH PROPOFOL (N/A) FOREIGN BODY REMOVAL (N/A) as a surgical intervention.  The patient's history has been reviewed, patient examined, no change in status, stable for surgery.  I have reviewed the patient's chart and labs.  Questions were answered to the patient's satisfaction.     [name]

## 2022-05-21 NOTE — Progress Notes (Signed)
SLP Cancellation Note  Patient Details Name: LAJUANE LEATHAM MRN: [id number] DOB: Dec 16, 1955   Cancelled treatment:       Reason Eval/Treat Not Completed: Medical issues which prohibited therapy.  Pt is currently OTF for EGD.  SLP will f/u as schedule allows.    Elvia Collum Horst Ostermiller 05/21/2022, 9:59 AM

## 2022-05-21 NOTE — Progress Notes (Signed)
Progress Note  Patient Name: Anthony [name] Date of Encounter: 05/21/2022  Primary Cardiologist: Lauree Chandler, MD   Subjective   Patient seen and examined at her bedside.   Inpatient Medications    Scheduled Meds:  azithromycin  500 mg Oral Daily   chlordiazePOXIDE  25 mg Oral TID   Followed by   Derrill Memo ON 05/23/2022] chlordiazePOXIDE  25 mg Oral Daily   clotrimazole   Topical BID   feeding supplement  237 mL Oral TID BM   folic acid  1 mg Oral Daily   metoprolol succinate  12.5 mg Oral Daily   multivitamin with minerals  1 tablet Oral Daily   PHENObarbital  65 mg Intravenous Q8H   Followed by   [START ON 05/23/2022] PHENObarbital  32.5 mg Intravenous Q8H   potassium chloride  20 mEq Oral Once   rosuvastatin  10 mg Oral Daily   thiamine  100 mg Oral Daily   Continuous Infusions:  amiodarone 30 mg/hr (05/21/22 0644)   cefTRIAXone (ROCEPHIN)  IV 2 g (05/20/22 1006)   magnesium sulfate bolus IVPB     phytonadione (VITAMIN K) 10 mg in dextrose 5 % 50 mL IVPB 10 mg (05/20/22 1944)   potassium PHOSPHATE IVPB (in mmol)     vancomycin 750 mg (05/20/22 2023)   PRN Meds: acetaminophen, LORazepam, methocarbamol   Vital Signs    Vitals:   05/21/22 0531 05/21/22 0600 05/21/22 0738 05/21/22 0800  BP: 92/66 105/80  119/85  Pulse: 84  86   Resp: (!) 26  (!) 28   Temp: 98.1 F (36.7 C)  98.1 F (36.7 C)   TempSrc: Oral  Oral   SpO2: 100%  97%   Weight: 68.1 kg     Height:       No intake or output data in the 24 hours ending 05/21/22 0816  Filed Weights   05/20/22 0005 05/20/22 0424 05/21/22 0531  Weight: 68.3 kg 68.3 kg 68.1 kg    Telemetry    Sinus rhythm - Personally Reviewed  ECG    ECG yesterday with sinus rhythm - Personally Reviewed  Physical Exam    General: Comfortable Head: Atraumatic, normal size  Eyes: PEERLA, EOMI  Neck: Supple, normal JVD Cardiac: Normal S1, S2; RRR; no murmurs, rubs, or gallops Lungs: Clear to auscultation  bilaterally Abd: Soft, nontender, no hepatomegaly  Ext: warm, no edema Musculoskeletal: No deformities, BUE and BLE strength normal and equal Skin: Warm and dry, no rashes   Neuro: Alert and oriented to person, place, time, and situation, CNII-XII grossly intact, no focal deficits  Psych: Normal mood and affect   Labs    Chemistry Recent Labs  Lab 05/19/22 0345 05/20/22 0310 05/20/22 1357 05/21/22 0544  NA 136 134* 133* 135  K 4.1 3.1* 3.5 3.3*  CL 106 103 100 103  CO2 13* 22 21* 20*  GLUCOSE 80 118* 96 87  BUN '14 20 20 16  '$ CREATININE 0.89 0.80 0.74 0.75  CALCIUM 7.8* 7.5* 7.7* 8.0*  PROT 6.6 5.5*  --  5.5*  ALBUMIN 2.8* 2.2*  --  2.2*  AST 150* 171*  --  128*  ALT 45* 45*  --  42  ALKPHOS 56 67  --  87  BILITOT 0.7 0.6  --  0.9  GFRNONAA >60 >60 >60 >60  ANIONGAP 17* '9 12 12     '$ Hematology Recent Labs  Lab 05/19/22 0345 05/20/22 0310 05/21/22 0544  WBC 14.8* 11.1* 7.4  RBC 2.58* 2.39* 2.32*  HGB 9.8* 8.7* 8.3*  HCT 28.9* 25.3* 24.8*  MCV 112.0* 105.9* 106.9*  MCH 38.0* 36.4* 35.8*  MCHC 33.9 34.4 33.5  RDW 17.9* 17.4* 17.1*  PLT 172 141* 153    Cardiac EnzymesNo results for input(s): "TROPONINI" in the last 168 hours. No results for input(s): "TROPIPOC" in the last 168 hours.   BNP Recent Labs  Lab 05/16/22 0209 05/19/22 0345  BNP 465.3* 996.8*     DDimer No results for input(s): "DDIMER" in the last 168 hours.   Radiology    CT ABDOMEN PELVIS W CONTRAST  Result Date: 05/20/2022 CLINICAL DATA:  A 66 year old male presents for evaluation of sepsis. Also with elevated liver enzymes. EXAM: CT ABDOMEN AND PELVIS WITH CONTRAST TECHNIQUE: Multidetector CT imaging of the abdomen and pelvis was performed using the standard protocol following bolus administration of intravenous contrast. RADIATION DOSE REDUCTION: This exam was performed according to the departmental dose-optimization program which includes automated exposure control, adjustment of the mA  and/or kV according to patient size and/or use of iterative reconstruction technique. CONTRAST:  33m OMNIPAQUE IOHEXOL 350 MG/ML SOLN COMPARISON:  Abdominal sonogram which was performed on May 19, 2022 and CT angiography of the chest from September of 2023. FINDINGS: Lower chest: Basilar airspace disease on the RIGHT with nodular features. Small RIGHT-sided pleural effusion. Trace LEFT-sided pleural effusion. Signs of coronary artery disease. Heart size mildly enlarged. No pericardial effusion. Coronary artery calcifications. No chest wall abnormality at the lung bases. Hepatobiliary: Liver with smooth contours. Portal vein is patent. No biliary duct dilation. No focal, suspicious hepatic lesion. Gallbladder is only mildly distended. There is generalized stranding in the abdomen stranding about the gallbladder in small amount of pericholecystic fluid. Pancreas: Mild atrophy of the pancreas without ductal dilation, inflammation or visible lesion. Spleen: Normal. Adrenals/Urinary Tract: Adrenal glands are normal. Symmetric renal enhancement. No hydronephrosis. Bilateral perinephric stranding relatively symmetric with generalized stranding and edema Stomach/Bowel: foreign body in the distal esophagus just above the GE junction, likely a coin not changed in position compared to imaging dating back to May 16, 2022. Postoperative changes about the GE junction similar to previous imaging colonic diverticulosis. Appendix not visualized though than no secondary signs that would indicate acute appendiceal process Vascular/Lymphatic: Aortic atherosclerosis. No sign of aneurysm. Smooth contour of the IVC. There is no gastrohepatic or hepatoduodenal ligament lymphadenopathy. No retroperitoneal or mesenteric lymphadenopathy. No pelvic sidewall lymphadenopathy. Atherosclerotic changes are mild. Reproductive: Unremarkable by CT. Other: Trace fluid in the pelvis. Musculoskeletal: No acute bone finding. No destructive bone  process. Spinal degenerative changes. Similar appearance of multilevel compression fractures in the spine compared to imaging from May 18, 2021. IMPRESSION: 1. Suspected foreign body in the distal esophagus likely a coin but should be correlated with any history of foreign body ingestion. Given failure to pass suggest narrowing of the GE junction just proximal to postoperative changes. 2. Basilar airspace disease on the RIGHT with nodular features. Findings are suspicious for pneumonia. 3. Small RIGHT-sided pleural effusion and trace LEFT pleural effusion. No sign of pleural enhancement. 4. Only mild distension of the gallbladder. Mild stranding and or fluid adjacent to the gallbladder in the setting of generalized edema is favored to represent sequela of anasarca but should be correlated clinically. If there is further concern for acute process could consider HIDA biliary for further evaluation as warranted. 5. Trace fluid in the pelvis. 6. Colonic diverticulosis without evidence of acute diverticulitis. 7. Aortic atherosclerosis. Aortic Atherosclerosis (ICD10-I70.0).  These results will be called to the ordering clinician or representative by the Radiologist Assistant, and communication documented in the PACS or [employer]. Electronically Signed   By: Zetta Bills M.D.   On: 05/20/2022 15:22   VAS [place] LOWER EXTREMITY VENOUS (DVT)  Result Date: 05/19/2022  Lower Venous DVT Study Patient Name:  LATHON ADAN Bramer  Date of Exam:   05/19/2022 Medical Rec #: [id number]     Accession #:    [id number] Date of Birth: 16-Feb-1956     Patient Gender: M Patient Age:   40 years Exam Location:  [facility] Procedure:      VAS [place] LOWER EXTREMITY VENOUS (DVT) Referring Phys: TODD MCDIARMID --------------------------------------------------------------------------------  Indications: Swelling, and Edema.  Limitations: Patient positioning. Comparison Study: no prior Performing Technologist: Archie Patten RVS   Examination Guidelines: A complete evaluation includes B-mode imaging, spectral Doppler, color Doppler, and power Doppler as needed of all accessible portions of each vessel. Bilateral testing is considered an integral part of a complete examination. Limited examinations for reoccurring indications may be performed as noted. The reflux portion of the exam is performed with the patient in reverse Trendelenburg.  +-----+---------------+---------+-----------+----------+-----------------------+ RIGHTCompressibilityPhasicitySpontaneityPropertiesThrombus Aging          +-----+---------------+---------+-----------+----------+-----------------------+ CFV                                               not visualized due to                                                     patient positioning     +-----+---------------+---------+-----------+----------+-----------------------+   +---------+---------------+---------+-----------+----------+-------------------+ LEFT     CompressibilityPhasicitySpontaneityPropertiesThrombus Aging      +---------+---------------+---------+-----------+----------+-------------------+ CFV      Full           Yes      Yes                                      +---------+---------------+---------+-----------+----------+-------------------+ SFJ      Full                                                             +---------+---------------+---------+-----------+----------+-------------------+ FV Prox  Full                                                             +---------+---------------+---------+-----------+----------+-------------------+ FV Mid   Full                                                             +---------+---------------+---------+-----------+----------+-------------------+  FV DistalFull                                                              +---------+---------------+---------+-----------+----------+-------------------+ PFV      Full                                                             +---------+---------------+---------+-----------+----------+-------------------+ POP      Full           Yes      Yes                                      +---------+---------------+---------+-----------+----------+-------------------+ PTV      Full           Yes      Yes                                      +---------+---------------+---------+-----------+----------+-------------------+ PERO                                                  Not well visualized +---------+---------------+---------+-----------+----------+-------------------+     Summary: LEFT: - There is no evidence of deep vein thrombosis in the lower extremity.  - No cystic structure found in the popliteal fossa.  *See table(s) above for measurements and observations. Electronically signed by Harold Barban MD on 05/19/2022 at 8:46:38 PM.    Final    US Abdomen Limited  Result Date: 05/19/2022 CLINICAL DATA:  Elevated liver enzymes EXAM: ULTRASOUND ABDOMEN LIMITED RIGHT UPPER QUADRANT COMPARISON:  11/03/2016 FINDINGS: Gallbladder: No gallstones or wall thickening visualized. No sonographic Murphy sign noted by sonographer. Common bile duct: Diameter: 5 mm. Liver: No focal lesion identified. Diffusely increased hepatic parenchymal echogenicity. Portal vein is patent on color Doppler imaging with normal direction of blood flow towards the liver. Other: None. IMPRESSION: The echogenicity of the liver is increased. This is a nonspecific finding but is most commonly seen with fatty infiltration of the liver. There are no obvious focal liver lesions. Electronically Signed   By: Davina Poke D.O.   On: 05/19/2022 11:02    Cardiac Studies   Relevant CV Studies: Cardiac CTA 10/02/20 IMPRESSION: 1. Coronary calcium score of 884. This was 91st percentile for  age, sex, and race matched control.   2. Normal coronary origin.  Right dominance.   3. CAD-RADS 3. Moderate stenosis. Consider symptom-guided anti-ischemic pharmacotherapy as well as risk factor modification per guideline directed care. Additional analysis with CT FFR will be submitted.   4. 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   CT FFR analysis shows no evidence of significant functional stenosis. Recommend aggressive risk factor management for non-obstructive calcifications.   Echo 03/17/22 1. Left ventricular ejection fraction,  by estimation, is 55 to 60%. The  left ventricle has normal function. The left ventricle has no regional  wall motion abnormalities. Left ventricular diastolic parameters are  consistent with Grade III diastolic  dysfunction (restrictive). Elevated left atrial pressure.   2. Right ventricular systolic function is normal. The right ventricular  size is mildly enlarged. There is normal pulmonary artery systolic  pressure. The estimated right ventricular systolic pressure is 21.3 mmHg.   3. Left atrial size was mildly dilated.   4. The mitral valve is abnormal. Mild to moderate mitral valve  regurgitation. No evidence of mitral stenosis.   5. The aortic valve is tricuspid. There is mild calcification of the  aortic valve. There is mild thickening of the aortic valve. Aortic valve  regurgitation is not visualized. No aortic stenosis is present.   6. The inferior vena cava is normal in size with greater than 50%  respiratory variability, suggesting right atrial pressure of 3 mmHg.   Comparison(s): Mitral regurgitation increased from prior.   FINDINGS   Left Ventricle: Left ventricular ejection fraction, by estimation, is 55  to 60%. The left ventricle has normal function. The left ventricle has no  regional wall motion abnormalities. The left ventricular internal cavity  size was normal in size.  There is   no left ventricular hypertrophy. Left ventricular diastolic parameters  are consistent with Grade III diastolic dysfunction (restrictive).  Elevated left atrial pressure.   Right Ventricle: The right ventricular size is mildly enlarged. No  increase in right ventricular wall thickness. Right ventricular systolic  function is normal. There is normal pulmonary artery systolic pressure.  The tricuspid regurgitant velocity is 2.82   m/s, and with an assumed right atrial pressure of 3 mmHg, the estimated  right ventricular systolic pressure is 08.6 mmHg.   Left Atrium: Left atrial size was mildly dilated.   Right Atrium: Right atrial size was normal in size.   Pericardium: There is no evidence of pericardial effusion.   Mitral Valve: Mitral valve billowing. The mitral valve is abnormal. Mild  to moderate mitral valve regurgitation. No evidence of mitral valve  stenosis.   Tricuspid Valve: The tricuspid valve is normal in structure. Tricuspid  valve regurgitation is mild . No evidence of tricuspid stenosis.   Aortic Valve: The aortic valve is tricuspid. There is mild calcification  of the aortic valve. There is mild thickening of the aortic valve. Aortic  valve regurgitation is not visualized. No aortic stenosis is present.   Pulmonic Valve: The pulmonic valve was normal in structure. Pulmonic valve  regurgitation is not visualized. No evidence of pulmonic stenosis.   Aorta: The aortic root and ascending aorta are structurally normal, with  no evidence of dilitation.   Venous: The inferior vena cava is normal in size with greater than 50%  respiratory variability, suggesting right atrial pressure of 3 mmHg.   IAS/Shunts: No atrial level shunt detected by color flow Doppler.     Patient Profile     66 y.o. male  with a hx of atrial fib, HTN, ETOH abuse, diastolic HF, PAF, depression and unprovoked PE.   Assessment & Plan    Afib rvr  Acute alcohol withdrawal   Electrolytes Abnormality Anemia  Back in sinus, convert to Amiodarone orally, '400mg'$  BID x 5 days, '200mg'$  BID x7 days then '200mg'$  daily. Keep on low dose beta blocker. Continue anticoagulation with xeralto '20mg'$  daily Please please isolate as appropriate keep K above 4 mag above 2. Continue to  monitor hemoglobin    [facility] will sign off.   Medication Recommendations:  Amiodarone '400mg'$  BID x 5 days, '200mg'$  BID x7 days then '200mg'$  daily, toprol xl 12.5 mg daily and Xeralto 20 mg daily Other recommendations (labs, testing, etc):  None  Follow up as an outpatient:  routine     Signed, Payne Garske, DO  05/21/2022, 8:16 AM

## 2022-05-21 NOTE — Progress Notes (Signed)
FMTS Interim Progress Note  Patient sleeping on assessment.  RN at bedside to help with report.  He is very confused after returning from GI procedure.  We will attempt to downgrade soft restraints to mittens.  As patient is still in acute alcohol withdrawal suspect that he will need to return to soft restraints later on tonight.    Will discontinue soft restraint order, but very low threshold to return to self restraints if patient becomes safety issue for self or nursing staff.  Darci Current, DO 05/21/2022, 2:30 PM PGY-1, [place] Medicine Service pager [phone]

## 2022-05-22 ENCOUNTER — Encounter (HOSPITAL_COMMUNITY): Payer: Self-pay | Admitting: Family Medicine

## 2022-05-22 DIAGNOSIS — F10931 Alcohol use, unspecified with withdrawal delirium: Secondary | ICD-10-CM | POA: Diagnosis not present

## 2022-05-22 LAB — BASIC METABOLIC PANEL
Anion gap: 13 (ref 5–15)
BUN: 14 mg/dL (ref 8–23)
CO2: 20 mmol/L — ABNORMAL LOW (ref 22–32)
Calcium: 7.9 mg/dL — ABNORMAL LOW (ref 8.9–10.3)
Chloride: 101 mmol/L (ref 98–111)
Creatinine, Ser: 0.93 mg/dL (ref 0.61–1.24)
GFR, Estimated: >60 mL/min (ref >60)
Glucose, Bld: 158 mg/dL — ABNORMAL HIGH (ref 70–99)
Potassium: 3.7 mmol/L (ref 3.5–5.1)
Sodium: 134 mmol/L — ABNORMAL LOW (ref 135–145)

## 2022-05-22 LAB — CBC
HCT: 28.4 % — ABNORMAL LOW (ref 39.0–52.0)
Hemoglobin: 9.5 g/dL — ABNORMAL LOW (ref 13.0–17.0)
MCH: 35.8 pg — ABNORMAL HIGH (ref 26.0–34.0)
MCHC: 33.5 g/dL (ref 30.0–36.0)
MCV: 107.2 fL — ABNORMAL HIGH (ref 80.0–100.0)
Platelets: 170 10*3/uL (ref 150–400)
RBC: 2.65 MIL/uL — ABNORMAL LOW (ref 4.22–5.81)
RDW: 16.4 % — ABNORMAL HIGH (ref 11.5–15.5)
WBC: 3.5 10*3/uL — ABNORMAL LOW (ref 4.0–10.5)
nRBC: 0 % (ref 0.0–0.2)

## 2022-05-22 LAB — MAGNESIUM
Magnesium: 1.5 mg/dL — ABNORMAL LOW (ref 1.7–2.4)
Magnesium: 1.8 mg/dL (ref 1.7–2.4)

## 2022-05-22 LAB — PHOSPHORUS
Phosphorus: 2.8 mg/dL (ref 2.5–4.6)
Phosphorus: 1.8 mg/dL — ABNORMAL LOW (ref 2.5–4.6)

## 2022-05-22 MED ORDER — POTASSIUM CHLORIDE CRYS ER 20 MEQ PO TBCR
40.0000 meq | EXTENDED_RELEASE_TABLET | Freq: Once | ORAL | Status: AC
  Administered 2022-05-22: 40 meq via ORAL
  Filled 2022-05-22: qty 2

## 2022-05-22 MED ORDER — MAGNESIUM SULFATE 2 GM/50ML IV SOLN
2.0000 g | Freq: Once | INTRAVENOUS | Status: AC
  Administered 2022-05-22: 2 g via INTRAVENOUS
  Filled 2022-05-22: qty 50

## 2022-05-22 MED ORDER — CHLORDIAZEPOXIDE HCL 25 MG PO CAPS
25.0000 mg | ORAL_CAPSULE | Freq: Once | ORAL | Status: AC
  Administered 2022-05-22: 25 mg via ORAL
  Filled 2022-05-22: qty 1

## 2022-05-22 NOTE — Progress Notes (Signed)
Nutrition Brief Note:   Pt remains on full liquid diet. Pt remains in soft restraints. RN reports that the pt is drinking Ensure shakes. Spoke with MD team about possible Cortrak placement to help pt meet his needs in setting of malnutrition. Plan is to hold off on tube placement due to potentially worsening agitation and continue to encourage PO. Will continue to monitor POC.   Thalia Bloodgood, RD, LDN, CNSC.

## 2022-05-22 NOTE — Progress Notes (Signed)
FMTS Interim Progress Note  Went to bedside to reassess patient and discussed with nurse who says that they attempted to take his soft wrist restraints off but he tried to get out of bed. Given this and to ensure the safety of both the patient and staff, patient meets criteria for continued use of bilateral soft wrist restraints and mittens. CIWAs recently have been 4 and he just received his phenobarbital. Will continue to monitor closely and continue restraints for now, the team will continuously reassess need for restraints and remove them once appropriate.   Donney Dice, DO 05/22/2022, 2:30 PM PGY-3, [place] [facility] Service pager [phone]

## 2022-05-22 NOTE — Progress Notes (Signed)
Mobility Specialist - Progress Note   05/22/22 1218  Mobility  Activity Contraindicated/medical hold   RN advised. Will follow up.  Larey Seat

## 2022-05-22 NOTE — Care Management Important Message (Signed)
Important Message  Patient Details  Name: Anthony [name] MRN: [id number] Date of Birth: 06-Nov-1955   Medicare Important Message Given:  Yes     Shelda Altes 05/22/2022, 9:15 AM

## 2022-05-22 NOTE — Progress Notes (Addendum)
Daily Progress Note Intern Pager: [phone]  Patient name: Anthony [name] Medical record number: [id number] Date of birth: [birth year] Age: 66 y.o. Gender: male  Primary Care Provider: Jacelyn Grip, MD Consultants: GI, cardiology Code Status: Full code  Pt Overview and Major Events to Date:  10/30: Admitted to FMTS  Assessment and Plan:  Anthony [name] is a 66 y.o. male presenting with back pain, SOB, and BLE swelling.  Currently being treated for acute on chronic diastolic CHF, A-fib, alcohol withdrawal.  Pertinent PMH/PSH includes A-fib, HFpEF, hypertension, alcohol use disorder.   * Sepsis ([place]) Afebrile overnight. HR controlled. WBC stable.  Unclear source of infection: CT chest showed possible pneumonia and CT AP showed possible stranding around the gallbladder but unlikely source as RUQ U/S negative. - cont Rocephin (10/31 - ) - cont Azithromycin (11/1 - ) -Discontinue vancomycin, MRSA swab negative with low suspicion of cellulitis - monitor fever curve, if temp >100.4 re-culture  - f/u blood and urine cx  Alcohol use disorder, severe, dependence ([place]) Last drink 10/30.  Patient in mittens this morning and is resting comfortably.  Spoke with RN at bedside about renewing soft restraints if patient becomes combative as he is still in acute alcohol withdrawal. -Low threshold to renew soft restraints if patient becomes combative - CIWAs q2h  - Cont Phenobarb taper with 1 mg Ativan q4h prn - cont Librium taper  - Thiamine, folate, MVI  Acute on chronic diastolic CHF (congestive heart failure) (HCC) Mild trace LE edema on exam, no crackles on exam.  - consider restarting home lasix 20 mg BID - Continue home metoprolol 12.5 mg daily - Strict I/Os, daily weights  Atrial fibrillation with RVR ([place]) Rate controled.  -Cardiology consulted, appreciate recommendations  -D/C amio drip to transition to PO 400 mg BID x5 days, 200 mg BID x7 day -Monitor electrolytes (Keep Mg>2 and  K>4) -Patient is not a good candidate for anticoagulation with substance abuse and multiple falls.  Continue discussions when patient becomes more coherent to discontinue anticoagulation.  Back pain Acute on chronic pain. No neuro deficits on exam. Has been sleeping on benches outside which he says has exacerbated his pain. He usually takes a muscle relaxer and tylenol PM, though it has been a couple days since his last dose. Suspect pain 2/2 chronic compression fractures at L2-4.  -Tylenol, robaxin for pain -Can consider lidocaine patch and/or x-ray if not controlled -PT/OT  Hypomagnesemia 1.6, re-pleated.   Macrocytic anemia Chronic, stable. Likely 2/2 alcohol use. -Continue folate, thiamine, MVI as above -daily CBC  HTN (hypertension) BP Stable overnight.  -Monitor BP -Cont metoprolol   Poor social situation Currently unhoused. Likely contributing to lack of follow up with PCP and inability to obtain medications, leading to recurrent hospitalizations and chronic disease exacerbations. -TOC consult for dispo planning    FEN/GI: Heart healthy PPx: Xarelto Dispo: pending clinical improvement . Barriers include acute alcohol withdrawal.   Subjective:  Patient resting comfortably in mittens.  No acute events overnight.  Attempted yesterday to remove soft restraints with multiple attempts of patient to harm staff.  Patient remains in mittens with very low threshold to resume soft restraints as he is in acute alcohol withdrawal.  Objective: Temp:  [97.6 F (36.4 C)-98.2 F (36.8 C)] 97.8 F (36.6 C) (11/03 0313) Pulse Rate:  [70-87] 87 (11/03 0626) Resp:  [13-28] 17 (11/03 0626) BP: (92-128)/(63-92) 99/75 (11/03 0313) SpO2:  [97 %-100 %] 100 % (11/03 0626) Weight:  [  65.5 kg-68.1 kg] 65.5 kg (11/03 0626) Physical Exam: General: Chronically ill-appearing, no acute distress Cardiovascular: Regular rate, irregularly regular rhythm, no murmurs on exam Respiratory: Clear, no  wheezing, no crackles, no increased work of breathing Abdomen: Soft, nontender, nondistended Extremities: No peripheral edema  Laboratory: Most recent CBC Lab Results  Component Value Date   WBC 3.5 (L) 05/22/2022   HGB 9.5 (L) 05/22/2022   HCT 28.4 (L) 05/22/2022   MCV 107.2 (H) 05/22/2022   PLT 170 05/22/2022   Most recent BMP    Latest Ref Rng & Units 05/22/2022    4:40 AM  BMP  Glucose 70 - 99 mg/dL 158   BUN 8 - 23 mg/dL 14   Creatinine 0.61 - 1.24 mg/dL 0.93   Sodium 135 - 145 mmol/L 134   Potassium 3.5 - 5.1 mmol/L 3.7   Chloride 98 - 111 mmol/L 101   CO2 22 - 32 mmol/L 20   Calcium 8.9 - 10.3 mg/dL 7.9    Anthony Current, DO 05/22/2022, 9:18 AM  PGY-1, [place] Intern pager: [phone], text pages welcome Secure chat group [place]

## 2022-05-22 NOTE — TOC Progression Note (Addendum)
Transition of Care [facility]) - Progression Note    Patient Details  Name: Anthony [name] MRN: [id number] Date of Birth: 1955/08/03  Transition of Care [facility]) CM/SW [place], [place] Phone Number: 05/22/2022, 1:13 PM  Clinical Narrative:     CSW spoke with patient earlier at bedside and offered patient resources for food,transportation,and outpatient substance use treatment services resources.Patient accepted. All questions answered. No further questions reported at that time.CSW informed patient will follow back up this afternoon to discuss PT/OT recommendations/dc plan. CSW followed  back up with patient regarding PT/OT recommendations/dc plan this afternoon. Patients orientation was fluctuating. CSW will follow back up with patient regarding PT/OT recommendation/dc plans when patient is better oriented.CSW will continue to follow and assist with patients dc planning needs.         Expected Discharge Plan and Services                                                 Social Determinants of Health (SDOH) Interventions Food Insecurity Interventions: Other (Comment) (Patient provided food resources) Transportation Interventions:  (Patient will need bus pass at dc)  Readmission Risk Interventions    10/08/2020    2:48 PM  Readmission Risk Prevention Plan  Transportation Screening Complete  PCP or Specialist Appt within 3-5 Days Complete  HRI or [place] Complete  Social Work Consult for [place] Planning/Counseling Complete  Palliative Care Screening Complete  Medication Review Press photographer) Complete
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
## 2022-05-22 NOTE — Progress Notes (Signed)
Physical Therapy Treatment Patient Details Name: Anthony [name] MRN: [id number] DOB: [birth year] Today's Date: 05/22/2022   History of Present Illness Pt is 66 y/o M admitted to [facility]19/22 with LE swelling, back pain, SOB, workup for sepsis. Of note, pt with 5 ED visits in past 12 months. PMH to include afib with RVR, CHF, chronic LBP, PEs, depression, delirium, alcohol abuse, homelessness.    PT Comments    Pt seen for PT tx with pt received in bed with pt & bed soiled with BM & rectal tube removed. Nurse called to room to assess & assist. Pt completes supine<>sit with supervision with extra time & use of bed rails, STS with min assist from EOB with cuing but poor understanding of safe hand placement when transferring with RW. After PT/RN assist with hygiene pt ambulates short distance in room with poor ability to follow cuing, ability to ambulate within base of AD, and with shuffled gait. Pt returned to bed & left in care of nurse.    Recommendations for follow up therapy are one component of a multi-disciplinary discharge planning process, led by the attending physician.  Recommendations may be updated based on patient status, additional functional criteria and insurance authorization.  Follow Up Recommendations  Skilled nursing-short term rehab (<3 hours/day) Can patient physically be transported by private vehicle: Yes   Assistance Recommended at Discharge Intermittent Supervision/Assistance  Patient can return home with the following A Gianino help with walking and/or transfers;A [place] help with bathing/dressing/bathroom;Assist for transportation;Help with stairs or ramp for entrance   Equipment Recommendations  Other (comment) (TBD)    Recommendations for Other Services       Precautions / Restrictions Precautions Precautions: Fall Precaution Comments: bil wrist restraints Restrictions Weight Bearing Restrictions: No     Mobility  Bed Mobility Overal bed mobility: Needs  Assistance Bed Mobility: Supine to Sit, Sit to Supine     Supine to sit: Supervision, HOB elevated Sit to supine: Supervision, HOB elevated   General bed mobility comments: extra time to complete supine<>sit with hospital bed features    Transfers Overall transfer level: Needs assistance Equipment used: Rolling walker (2 wheels) Transfers: Sit to/from Stand Sit to Stand: Min assist, From elevated surface           General transfer comment: cuing for safe hand placement but poor return demo    Ambulation/Gait Ambulation/Gait assistance: Min Web designer (Feet): 20 Feet Assistive device: Rolling walker (2 wheels) Gait Pattern/deviations: Narrow base of support, Decreased stride length, Shuffle Gait velocity: decreased     General Gait Details: Pt pushes RW out in front & unable to follow various cuing to ambulate within base of AD. Step to pattern with shuffled steps.   Stairs             Wheelchair Mobility    Modified Rankin (Stroke Patients Only)       Balance Overall balance assessment: Needs assistance Sitting-balance support: Bilateral upper extremity supported, Feet supported, No upper extremity supported Sitting balance-Leahy Scale: Fair Sitting balance - Comments: supervision static sitting   Standing balance support: Bilateral upper extremity supported, During functional activity, Reliant on assistive device for balance Standing balance-Leahy Scale: Fair                              Cognition Arousal/Alertness: Awake/alert   Overall Cognitive Status: No family/caregiver present to determine baseline cognitive functioning  General Comments: Pt oriented to self, follows simple commands throughout session.        Exercises      General Comments        Pertinent Vitals/Pain Pain Assessment Pain Assessment: No/denies pain    Home Living                           Prior Function            PT Goals (current goals can now be found in the care plan section) Acute Rehab PT Goals Patient Stated Goal: to have less back pain PT Goal Formulation: With patient Time For Goal Achievement: 06/03/22 Potential to Achieve Goals: Fair Progress towards PT goals: Progressing toward goals    Frequency    Min 3X/week      PT Plan Current plan remains appropriate    Co-evaluation              AM-PAC PT "6 Clicks" Mobility   Outcome Measure  Help needed turning from your back to your side while in a flat bed without using bedrails?: None Help needed moving from lying on your back to sitting on the side of a flat bed without using bedrails?: A Kotara Help needed moving to and from a bed to a chair (including a wheelchair)?: A Knight Help needed standing up from a chair using your arms (e.g., wheelchair or bedside chair)?: A [place] Help needed to walk in hospital room?: A Ealey Help needed climbing 3-5 steps with a railing? : A Lot 6 Click Score: 18    End of Session   Activity Tolerance: Patient tolerated treatment well Patient left: in bed;with call bell/phone within reach;with bed alarm set (LUE wrist restraint donned, nurse in room planning to don RUE wrist restraint & BUE mittens)   PT Visit Diagnosis: Other abnormalities of gait and mobility (R26.89);Unsteadiness on feet (R26.81)     Time: 0768-0881 PT Time Calculation (min) (ACUTE ONLY): 18 min  Charges:  $Therapeutic Activity: 8-22 mins                     Lavone Nian, PT, DPT 05/22/22, 4:29 PM  Waunita Schooner 05/22/2022, 4:28 PM

## 2022-05-23 DIAGNOSIS — F1020 Alcohol dependence, uncomplicated: Secondary | ICD-10-CM | POA: Diagnosis not present

## 2022-05-23 DIAGNOSIS — R6520 Severe sepsis without septic shock: Secondary | ICD-10-CM | POA: Diagnosis not present

## 2022-05-23 DIAGNOSIS — G9341 Metabolic encephalopathy: Secondary | ICD-10-CM | POA: Diagnosis not present

## 2022-05-23 DIAGNOSIS — A419 Sepsis, unspecified organism: Secondary | ICD-10-CM | POA: Diagnosis not present

## 2022-05-23 LAB — CBC
HCT: 28.3 % — ABNORMAL LOW (ref 39.0–52.0)
Hemoglobin: 9.1 g/dL — ABNORMAL LOW (ref 13.0–17.0)
MCH: 35.8 pg — ABNORMAL HIGH (ref 26.0–34.0)
MCHC: 32.2 g/dL (ref 30.0–36.0)
MCV: 111.4 fL — ABNORMAL HIGH (ref 80.0–100.0)
Platelets: 191 10*3/uL (ref 150–400)
RBC: 2.54 MIL/uL — ABNORMAL LOW (ref 4.22–5.81)
RDW: 16.5 % — ABNORMAL HIGH (ref 11.5–15.5)
WBC: 4.3 10*3/uL (ref 4.0–10.5)
nRBC: 0 % (ref 0.0–0.2)

## 2022-05-23 LAB — MAGNESIUM
Magnesium: 1.6 mg/dL — ABNORMAL LOW (ref 1.7–2.4)
Magnesium: 2.3 mg/dL (ref 1.7–2.4)

## 2022-05-23 LAB — BASIC METABOLIC PANEL
Anion gap: 15 (ref 5–15)
Anion gap: 13 (ref 5–15)
BUN: 12 mg/dL (ref 8–23)
BUN: 9 mg/dL (ref 8–23)
CO2: 19 mmol/L — ABNORMAL LOW (ref 22–32)
CO2: 20 mmol/L — ABNORMAL LOW (ref 22–32)
Calcium: 8.3 mg/dL — ABNORMAL LOW (ref 8.9–10.3)
Calcium: 8.2 mg/dL — ABNORMAL LOW (ref 8.9–10.3)
Chloride: 106 mmol/L (ref 98–111)
Chloride: 105 mmol/L (ref 98–111)
Creatinine, Ser: 0.57 mg/dL — ABNORMAL LOW (ref 0.61–1.24)
Creatinine, Ser: 0.63 mg/dL (ref 0.61–1.24)
GFR, Estimated: >60 mL/min (ref >60)
GFR, Estimated: >60 mL/min (ref >60)
Glucose, Bld: 86 mg/dL (ref 70–99)
Glucose, Bld: 87 mg/dL (ref 70–99)
Potassium: 3.8 mmol/L (ref 3.5–5.1)
Potassium: 3.5 mmol/L (ref 3.5–5.1)
Sodium: 140 mmol/L (ref 135–145)
Sodium: 138 mmol/L (ref 135–145)

## 2022-05-23 LAB — PHOSPHORUS
Phosphorus: 2.2 mg/dL — ABNORMAL LOW (ref 2.5–4.6)
Phosphorus: 2.4 mg/dL — ABNORMAL LOW (ref 2.5–4.6)

## 2022-05-23 MED ORDER — POTASSIUM PHOSPHATES 15 MMOLE/5ML IV SOLN
15.0000 mmol | Freq: Once | INTRAVENOUS | Status: AC
  Administered 2022-05-23: 15 mmol via INTRAVENOUS
  Filled 2022-05-23: qty 5

## 2022-05-23 MED ORDER — MAGNESIUM SULFATE 4 GM/100ML IV SOLN
4.0000 g | Freq: Once | INTRAVENOUS | Status: AC
  Administered 2022-05-23: 4 g via INTRAVENOUS
  Filled 2022-05-23: qty 100

## 2022-05-23 NOTE — Progress Notes (Signed)
I entered my shift assessment from 9am under 7:38am and then deleted- Richardo Hanks, Student Nurse

## 2022-05-23 NOTE — Progress Notes (Signed)
Daily Progress Note Intern Pager: [phone]  Patient name: Anthony [name] Medical record number: [id number] Date of birth: [birth year] Age: 66 y.o. Gender: male  Primary Care Provider: Jacelyn Grip, MD Consultants: GI, Cardiology  Code Status: Full code  Pt Overview and Major Events to Date:  10/30: admitted to FMTS  Assessment and Plan:  Anthony [name] is a 66 y.o. male presenting with back pain, SOB, and BLE swelling.  Currently being treated for acute on chronic diastolic CHF, A-fib, alcohol withdrawal.  Pertinent PMH/PSH includes A-fib, HFpEF, hypertension, alcohol use disorder.    * Sepsis ([place]) Afebrile overnight. HR controlled. WBC stable.  Unclear source of infection: CT chest showed possible pneumonia and CT AP showed possible stranding around the gallbladder but unlikely source as RUQ U/S negative. - cont Rocephin (10/31 - 11/5) - s/p Azithromycin (11/1 - 11/3) - monitor fever curve, if temp >100.4 re-culture   Alcohol use disorder, severe, dependence ([place]) Last drink 10/30.  Patient in mittens this morning and is resting comfortably.  -Low threshold to renew soft restraints if patient becomes combative - CIWAs q2h  - Cont Phenobarb taper with 1 mg Ativan q4h prn - cont Librium taper  - Thiamine, folate, MVI  Acute on chronic diastolic CHF (congestive heart failure) (HCC) Mild trace LE edema on exam, no crackles on exam.  - consider restarting home lasix 20 mg BID - Continue home metoprolol 12.5 mg daily - Strict I/Os, daily weights  Atrial fibrillation with RVR (HCC) Rate controled.  -Cont amio PO 400 mg BID x5 days, 200 mg BID x7 day -Monitor electrolytes (Keep Mg>2 and K>4) -Patient is not a good candidate for anticoagulation with substance abuse and multiple falls.  Continue discussions when patient becomes more coherent to discontinue anticoagulation.  Back pain Acute on chronic pain. No neuro deficits on exam. Has been sleeping on benches outside which he  says has exacerbated his pain. He usually takes a muscle relaxer and tylenol PM. Suspect pain 2/2 chronic compression fractures at L2-4.  -Tylenol, robaxin for pain -Can consider lidocaine patch and/or x-ray if not controlled -PT/OT  Macrocytic anemia Stable.  - trend CBC  - transfuse <7  Poor social situation Currently unhoused. Likely contributing to lack of follow up with PCP and inability to obtain medications, leading to recurrent hospitalizations and chronic disease exacerbations. -TOC consult for dispo planning    FEN/GI: heart healthy, soft diet due to poor dentition  PPx: Xarelto  Dispo:SNF in 2-3 days. Barriers include dispo planning.   Subjective:  No acute events overnight. He is much more alert this morning, but not redirectable. Remains in soft mittens for patient safety.   Objective: Temp:  [97.5 F (36.4 C)-97.8 F (36.6 C)] 97.5 F (36.4 C) (11/04 0738) Pulse Rate:  [66-82] 77 (11/04 0738) Resp:  [16-18] 16 (11/04 0738) BP: (104-122)/(49-88) 122/88 (11/04 0738) SpO2:  [97 %-100 %] 97 % (11/04 0738) Physical Exam: General: chronically ill appearing, No acute distress Cardiovascular: regular rate, regular rhythm, no murmurs on exam  Respiratory: clear, no wheezing, no crackles  Abdomen: soft, non-tender, non-distended Extremities: no peripheral edema, deconditioned   Laboratory: Most recent CBC Lab Results  Component Value Date   WBC 4.3 05/23/2022   HGB 9.1 (L) 05/23/2022   HCT 28.3 (L) 05/23/2022   MCV 111.4 (H) 05/23/2022   PLT 191 05/23/2022   Most recent BMP    Latest Ref Rng & Units 05/23/2022    6:06 AM  BMP  Glucose 70 - 99 mg/dL 86   BUN 8 - 23 mg/dL 12   Creatinine 0.61 - 1.24 mg/dL 0.57   Sodium 135 - 145 mmol/L 140   Potassium 3.5 - 5.1 mmol/L 3.8   Chloride 98 - 111 mmol/L 106   CO2 22 - 32 mmol/L 19   Calcium 8.9 - 10.3 mg/dL 8.3    Darci Current, DO 05/23/2022, 8:48 AM  PGY-1, [place] Intern pager:  [phone], text pages welcome Secure chat group [place]

## 2022-05-24 ENCOUNTER — Encounter (HOSPITAL_COMMUNITY): Payer: Self-pay | Admitting: Gastroenterology

## 2022-05-24 DIAGNOSIS — F1020 Alcohol dependence, uncomplicated: Secondary | ICD-10-CM | POA: Diagnosis not present

## 2022-05-24 LAB — BASIC METABOLIC PANEL
Anion gap: 10 (ref 5–15)
BUN: 7 mg/dL — ABNORMAL LOW (ref 8–23)
CO2: 22 mmol/L (ref 22–32)
Calcium: 7.8 mg/dL — ABNORMAL LOW (ref 8.9–10.3)
Chloride: 102 mmol/L (ref 98–111)
Creatinine, Ser: 0.62 mg/dL (ref 0.61–1.24)
GFR, Estimated: >60 mL/min (ref >60)
Glucose, Bld: 79 mg/dL (ref 70–99)
Potassium: 3.8 mmol/L (ref 3.5–5.1)
Sodium: 134 mmol/L — ABNORMAL LOW (ref 135–145)

## 2022-05-24 LAB — CULTURE, BLOOD (ROUTINE X 2)
Culture: NO GROWTH
Culture: NO GROWTH
Special Requests: ADEQUATE
Special Requests: ADEQUATE

## 2022-05-24 LAB — CBC
HCT: 29.5 % — ABNORMAL LOW (ref 39.0–52.0)
Hemoglobin: 9.7 g/dL — ABNORMAL LOW (ref 13.0–17.0)
MCH: 35.8 pg — ABNORMAL HIGH (ref 26.0–34.0)
MCHC: 32.9 g/dL (ref 30.0–36.0)
MCV: 108.9 fL — ABNORMAL HIGH (ref 80.0–100.0)
Platelets: 265 10*3/uL (ref 150–400)
RBC: 2.71 MIL/uL — ABNORMAL LOW (ref 4.22–5.81)
RDW: 16.5 % — ABNORMAL HIGH (ref 11.5–15.5)
WBC: 6.6 10*3/uL (ref 4.0–10.5)
nRBC: 0 % (ref 0.0–0.2)

## 2022-05-24 LAB — PHOSPHORUS
Phosphorus: 3.5 mg/dL (ref 2.5–4.6)
Phosphorus: 3.1 mg/dL (ref 2.5–4.6)

## 2022-05-24 LAB — MAGNESIUM
Magnesium: 1.5 mg/dL — ABNORMAL LOW (ref 1.7–2.4)
Magnesium: 1.8 mg/dL (ref 1.7–2.4)

## 2022-05-24 MED ORDER — PHENOBARBITAL SODIUM 65 MG/ML IJ SOLN: 32.5000 mg | Freq: Three times a day (TID) | INTRAMUSCULAR | Status: AC

## 2022-05-24 MED ORDER — LORAZEPAM 2 MG/ML IJ SOLN: 1.0000 mg | INTRAMUSCULAR | Status: DC | PRN

## 2022-05-24 MED ORDER — NYSTATIN 100000 UNIT/GM EX POWD: CUTANEOUS | Status: DC | PRN

## 2022-05-24 MED ORDER — MAGNESIUM SULFATE 4 GM/100ML IV SOLN
4.0000 g | Freq: Once | INTRAVENOUS | Status: AC
  Administered 2022-05-24: 4 g via INTRAVENOUS
  Filled 2022-05-24: qty 100

## 2022-05-24 MED ORDER — POTASSIUM CHLORIDE CRYS ER 20 MEQ PO TBCR
40.0000 meq | EXTENDED_RELEASE_TABLET | Freq: Once | ORAL | Status: AC
  Administered 2022-05-24: 40 meq via ORAL
  Filled 2022-05-24: qty 2

## 2022-05-24 MED ORDER — PHENOBARBITAL 32.4 MG PO TABS
32.4000 mg | ORAL_TABLET | Freq: Three times a day (TID) | ORAL | Status: AC
  Administered 2022-05-25: 32.4 mg via ORAL
  Filled 2022-05-24: qty 1

## 2022-05-24 MED ORDER — LORAZEPAM 1 MG PO TABS
1.0000 mg | ORAL_TABLET | ORAL | Status: DC | PRN
  Administered 2022-05-25: 1 mg via ORAL
  Filled 2022-05-24: qty 1

## 2022-05-24 NOTE — Progress Notes (Signed)
A consult was placed to IV Therapy to establish new iv access for pt;  since admission on 05-19-22, pt has had 7-8 iv restarts, primarily due to the pt removing them;  pt was assessed again however no suitable veins are noted; bruises bilaterally; pt has not been wearing the mittens;  RN aware of no iv access at present.

## 2022-05-24 NOTE — Progress Notes (Signed)
Pt pulled out only IV. IV team attempted to place but was unsuccessful. Family medicine made aware. No new orders at this time.

## 2022-05-24 NOTE — Plan of Care (Signed)
Patient was seen with Dr. Donney Dice in the afternoon. Patient was sleeping in bed. CIWA score is 0. We will discontinue the non-violent restraint order at this time and if agitation arises, we can reassess.

## 2022-05-24 NOTE — Progress Notes (Signed)
FMTS Interim Progress Note  S: Went to eval pt because he was unable to get IV access with IV team. Pt still altered and nurse had hard time getting him to take meds.  O: BP 131/71 (BP Location: Left Arm)   Pulse 90   Temp 97.9 F (36.6 C) (Axillary)   Resp (!) 23   Ht '5\' 10"'$  (1.778 m)   Wt 67.7 kg   SpO2 98%   BMI 21.42 kg/m   CIWA: 9  A/P: Alcohol Withdrawal - Converted phenobarbital IV to PO for remainder of taper - Discussed with nurse that soft restraints can be added and reattempt to obtain IV access if pt is still refusing medications.    Arlyce Dice, MD 05/24/2022, 10:59 PM PGY-1, [place] [facility] Service pager [phone]

## 2022-05-24 NOTE — Progress Notes (Addendum)
Daily Progress Note Intern Pager: [phone]  Patient name: Anthony [name] Medical record number: [id number] Date of birth: 1955-12-30 Age: 66 y.o. Gender: male  Primary Care Provider: Jacelyn Grip, MD Consultants: GI, cardiology Code Status: FULL  Pt Overview and Major Events to Date:  10/30: admitted  Assessment and Plan:  Anthony [name] is a 66 y.o. male presenting with back pain, SOB, and BLE swelling.  Currently being treated for acute on chronic diastolic CHF, A-fib, alcohol withdrawal.  Pertinent PMH/PSH includes A-fib, HFpEF, hypertension, alcohol use disorder.     * Sepsis ([place]) Remains afebrile. HR controlled. WBC stable.  Unclear source of infection: CT chest showed possible pneumonia and CT AP showed possible stranding around the gallbladder but unlikely source as RUQ U/S negative. - s/p Rocephin (10/31 - 11/4) - s/p Azithromycin (11/1 - 11/3) - monitor fever curve, if temp >100.4 re-culture   Alcohol use disorder, severe, dependence ([place]) Last drink 10/30.  Patient in non-violent restraints resting comfortably. Patient has slowed fluency of speech and is fairly sedated. He is A&Ox3, knowing is name, the date, and place. - CIWAs q4h  - Cont Phenobarb taper with 1 mg Ativan q4h prn - Thiamine, folate, MVI -If patient remains calm, d/c restraints  Acute on chronic diastolic CHF (congestive heart failure) (HCC) Mild trace LE edema on exam, no crackles on exam.  - consider restarting home lasix 20 mg BID - Continue home metoprolol 12.5 mg daily - Strict I/Os, daily weights  Atrial fibrillation with RVR ([place]) Rate controled. K 3.8 and Mag 1.5, repleted this morning. -Cont amio PO 400 mg BID x5 days, 200 mg BID x7 day -Monitor electrolytes (Keep Mg>2 and K>4) -Patient is not a good candidate for anticoagulation with substance abuse and multiple falls.  Continue discussions when patient becomes more coherent to discontinue anticoagulation.  Back pain Acute on chronic  pain. No neuro deficits on exam. Has been sleeping on benches outside which he says has exacerbated his pain. He usually takes a muscle relaxer and tylenol PM. Suspect pain 2/2 chronic compression fractures at L2-4.  -Tylenol, robaxin for pain -Can consider lidocaine patch and/or x-ray if not controlled -PT/OT  Macrocytic anemia Stable.  - trend CBC  - transfuse <7  Poor social situation Currently unhoused. Likely contributing to lack of follow up with PCP and inability to obtain medications, leading to recurrent hospitalizations and chronic disease exacerbations. -TOC consult for dispo planning        FEN/GI: heart healthy, soft diet due to poor dentition  PPx: Xarelto  Dispo:SNF in 2-3 days. Barriers include dispo planning.   Subjective:  Patient was seen this AM. He was resting comfortably but was slow in his responses. He voices that he needs help with his "facilities" but not able to elaborate what that means.  Objective: Temp:  [97.6 F (36.4 C)-99 F (37.2 C)] 97.6 F (36.4 C) (11/05 0432) Pulse Rate:  [65-100] 81 (11/05 0805) Resp:  [18-20] 20 (11/05 0805) BP: (101-149)/(68-99) 112/68 (11/05 0805) SpO2:  [96 %-97 %] 97 % (11/05 0426) Weight:  [149 lb 4 oz (67.7 kg)] 149 lb 4 oz (67.7 kg) (11/05 0500)  Physical Exam: General: chronically ill appearing, no acute distress, in mittens and restraints Cardiovascular: RRR, no murmurs on exam Respiratory: clear, no wheezing, no crackles Abdomen: soft, non-tender, non-distended Extremities: no peripheral edema, deconditioned Neuro: A&O x 3. Slowed speech and nonsensical responses at times.  Laboratory: Most recent CBC Lab Results  Component  Value Date   WBC 6.6 05/24/2022   HGB 9.7 (L) 05/24/2022   HCT 29.5 (L) 05/24/2022   MCV 108.9 (H) 05/24/2022   PLT 265 05/24/2022   Most recent BMP    Latest Ref Rng & Units 05/24/2022    5:45 AM  BMP  Glucose 70 - 99 mg/dL 79   BUN 8 - 23 mg/dL 7   Creatinine 0.61 -  1.24 mg/dL 0.62   Sodium 135 - 145 mmol/L 134   Potassium 3.5 - 5.1 mmol/L 3.8   Chloride 98 - 111 mmol/L 102   CO2 22 - 32 mmol/L 22   Calcium 8.9 - 10.3 mg/dL 7.8      Alesia Morin, MD 05/24/2022, 11:55 AM  PGY-1, [place] Intern pager: [phone], text pages welcome Secure chat group [place]

## 2022-05-25 DIAGNOSIS — R410 Disorientation, unspecified: Secondary | ICD-10-CM | POA: Diagnosis not present

## 2022-05-25 LAB — MAGNESIUM
Magnesium: 1.5 mg/dL — ABNORMAL LOW (ref 1.7–2.4)
Magnesium: 1.3 mg/dL — ABNORMAL LOW (ref 1.7–2.4)

## 2022-05-25 LAB — SURGICAL PATHOLOGY

## 2022-05-25 LAB — BASIC METABOLIC PANEL
Anion gap: 8 (ref 5–15)
BUN: <5 mg/dL — ABNORMAL LOW (ref 8–23)
CO2: 22 mmol/L (ref 22–32)
Calcium: 7.9 mg/dL — ABNORMAL LOW (ref 8.9–10.3)
Chloride: 106 mmol/L (ref 98–111)
Creatinine, Ser: 0.51 mg/dL — ABNORMAL LOW (ref 0.61–1.24)
GFR, Estimated: >60 mL/min (ref >60)
Glucose, Bld: 85 mg/dL (ref 70–99)
Potassium: 3.6 mmol/L (ref 3.5–5.1)
Sodium: 136 mmol/L (ref 135–145)

## 2022-05-25 LAB — BLOOD GAS, ARTERIAL
Acid-Base Excess: 3.7 mmol/L — ABNORMAL HIGH (ref 0.0–2.0)
Bicarbonate: 26.1 mmol/L (ref 20.0–28.0)
Drawn by: 441371
O2 Saturation: 99.3 %
Patient temperature: 36.8
pCO2 arterial: 32 mmHg (ref 32–48)
pH, Arterial: 7.52 — ABNORMAL HIGH (ref 7.35–7.45)
pO2, Arterial: 72 mmHg — ABNORMAL LOW (ref 83–108)

## 2022-05-25 LAB — AMMONIA: Ammonia: 23 umol/L (ref 9–35)

## 2022-05-25 LAB — CBC
HCT: 25 % — ABNORMAL LOW (ref 39.0–52.0)
Hemoglobin: 8.3 g/dL — ABNORMAL LOW (ref 13.0–17.0)
MCH: 36.1 pg — ABNORMAL HIGH (ref 26.0–34.0)
MCHC: 33.2 g/dL (ref 30.0–36.0)
MCV: 108.7 fL — ABNORMAL HIGH (ref 80.0–100.0)
Platelets: 263 10*3/uL (ref 150–400)
RBC: 2.3 MIL/uL — ABNORMAL LOW (ref 4.22–5.81)
RDW: 16.7 % — ABNORMAL HIGH (ref 11.5–15.5)
WBC: 6.3 10*3/uL (ref 4.0–10.5)
nRBC: 0 % (ref 0.0–0.2)

## 2022-05-25 LAB — PHOSPHORUS
Phosphorus: 3.1 mg/dL (ref 2.5–4.6)
Phosphorus: 2.9 mg/dL (ref 2.5–4.6)

## 2022-05-25 MED ORDER — GERHARDT'S BUTT CREAM
TOPICAL_CREAM | CUTANEOUS | Status: DC | PRN
  Filled 2022-05-25: qty 1

## 2022-05-25 MED ORDER — DICLOFENAC SODIUM 1 % EX GEL
2.0000 g | Freq: Two times a day (BID) | CUTANEOUS | Status: DC | PRN
  Administered 2022-05-26 – 2022-05-30 (×2): 2 g via TOPICAL
  Filled 2022-05-25: qty 100

## 2022-05-25 MED ORDER — MAGNESIUM CHLORIDE 64 MG PO TBEC
1.0000 | DELAYED_RELEASE_TABLET | Freq: Once | ORAL | Status: AC
  Administered 2022-05-25: 64 mg via ORAL
  Filled 2022-05-25: qty 1

## 2022-05-25 NOTE — TOC Progression Note (Signed)
Transition of Care [facility]) - Progression Note    Patient Details  Name: OHN BOSTIC MRN: [id number] Date of Birth: 29-Dec-1955  Transition of Care [facility]) CM/SW [place], [place] Phone Number: 05/25/2022, 4:07 PM  Clinical Narrative:     CSW following to speak with patient regarding PT/OT recommendations when appropriate. CSW will continue to follow and assist with patients dc planning needs.       Expected Discharge Plan and Services                                                 Social Determinants of Health (SDOH) Interventions Food Insecurity Interventions: Other (Comment) (Patient provided food resources) Transportation Interventions:  (Patient will need bus pass at dc)  Readmission Risk Interventions    10/08/2020    2:48 PM  Readmission Risk Prevention Plan  Transportation Screening Complete  PCP or Specialist Appt within 3-5 Days Complete  HRI or [place] Complete  Social Work Consult for [place] Planning/Counseling Complete  Palliative Care Screening Complete  Medication Review Press photographer) Complete

## 2022-05-25 NOTE — Progress Notes (Signed)
Speech Language Pathology Treatment: Dysphagia  Patient Details Name: Anthony [name] MRN: [id number] DOB: Apr 14, 1956 Today's Date: 05/25/2022 Time: 7893-8101 SLP Time Calculation (min) (ACUTE ONLY): 16 min  Assessment / Plan / Recommendation Clinical Impression  Pt is more alert compared to initial evaluation by this SLP, engaging more in self-feeding but still needing some help. He does need some stimulation and cueing to become more alert initially and he remains disoriented. Since last SLP visit he has had his EGD with removal of quarter from his esophagus and GI has s/o. SLP provided trials of thin liquids and purees with no overt s/s of aspiration but frequent cues needed to redirect to task. Will adjust from full liquids diet to Dys 1 (pureed) diet and thin liquids. He is still going to need careful assistance during meals due to mentation.   HPI HPI: Pt is a 66 yo male presenting with back pain, SOB, BLE swelling. Work up still underway but with concern for CHF exacerbation, sepsis. Pt found to have foreign body (suspected coin) in his distal esophagus with endoscopy currently on hold so cleared for clear liquids only per GI. Two prior swallow evals with functional appearing swallow in light of limited dentition, recommending Dys 3-regular solids and thin liquids. PMH includes: HH, EtOH and tobacco use, a-fib, anxiety, HFpEF, HTN, unhoused for the past year      SLP Plan  Continue with current plan of care      Recommendations for follow up therapy are one component of a multi-disciplinary discharge planning process, led by the attending physician.  Recommendations may be updated based on patient status, additional functional criteria and insurance authorization.    Recommendations  Diet recommendations: Dysphagia 1 (puree);Thin liquid Liquids provided via: Cup;Straw Medication Administration: Crushed with puree Supervision: Patient able to self feed;Full supervision/cueing for  compensatory strategies Compensations: Slow rate;Small sips/bites Postural Changes and/or Swallow Maneuvers: Seated upright 90 degrees;Upright 30-60 min after meal                Oral Care Recommendations: Oral care BID Follow Up Recommendations: No SLP follow up Assistance recommended at discharge: None SLP Visit Diagnosis: Dysphagia, unspecified (R13.10) Plan: Continue with current plan of care           [name]., M.A. [place] Office [phone]  Secure chat preferred   05/25/2022, 11:20 AM

## 2022-05-25 NOTE — Progress Notes (Signed)
Daily Progress Note Intern Pager: [phone]  Patient name: Anthony [name] Medical record number: [id number] Date of birth: 08-09-1955 Age: 66 y.o. Gender: male  Primary Care Provider: Jacelyn Grip, MD Consultants: GI, cardiology Code Status: Full code  Pt Overview and Major Events to Date:  10/30: Admitted  Assessment and Plan:  Anthony [name] is a 66 y.o. male presenting with back pain, SOB, and BLE swelling.  Currently being treated for acute on chronic diastolic CHF, A-fib, alcohol withdrawal.  Pertinent PMH/PSH includes A-fib, HFpEF, hypertension, alcohol use disorder.       * Agitation Patient pulled out IVs last night.  And required 1 mg of Ativan.  CIWA's were documented to 14 but alcohol withdrawal period is over.  Lapbelt restraint in place.  Patient is sedated and only responds to voice and stimuli. - CT head to rule out acute stroke - discontinue Ativan - Continue phenobarb taper  - Obtain IV access, replete electrolytes as appropriate  Alcohol use disorder, severe, dependence ([place]) Last drink 10/30.  Should be through alcohol withdrawal.  He was given 1 mg Ativan this morning along with his phenobarbital taper.  He is obviously sedated and incoherent.  Restrained with lap belt.  With ongoing agitation in the setting of completed alcohol withdrawal therapy, will need work-up for delirium. - Discontinue restraints - Discontinue Ativan - Cont Phenobarb taper  - Thiamine, folate, MVI  Sepsis ([place]) Resolved.  S/p antibiotics.  Labs, VS, and imaging stable.   - s/p Rocephin (10/31 - 11/4) - s/p Azithromycin (11/1 - 11/3) - monitor fever curve, if temp >100.4 re-culture   Acute on chronic diastolic CHF (congestive heart failure) (HCC) Mild trace LE edema on exam, no crackles on exam.  - consider restarting home lasix 20 mg BID - Continue home metoprolol 12.5 mg daily - Strict I/Os, daily weights  Atrial fibrillation with RVR (HCC) Rate controled.  -Cont amio PO 400  mg BID x5 days, 200 mg BID x7 day -Monitor electrolytes (Keep Mg>2 and K>4) -d/c anticoagulation at discharge  Macrocytic anemia Stable.  - d/c daily CBC  Poor social situation Currently unhoused. Likely contributing to lack of follow up with PCP and inability to obtain medications, leading to recurrent hospitalizations and chronic disease exacerbations. -TOC consult for dispo planning   FEN/GI: Heart healthy, soft diet PPx: Xarelto Dispo:SNF pending clinical improvement . Barriers include AMS.   Subjective:  Patient agitated and increasingly altered over the night.  Nursing gave multiple doses of Ativan overnight for agitation.  His CIWA's are documented in the teens.  However he is through the alcohol withdrawal.  This morning he is responsive to voice and stimuli but will not follow commands.  He is speaking incoherently.    Objective: Temp:  [97.9 F (36.6 C)-98.3 F (36.8 C)] 98.3 F (36.8 C) (11/06 0410) Pulse Rate:  [85-90] 85 (11/06 0410) Resp:  [19-23] 22 (11/06 0410) BP: (101-131)/(67-82) 101/67 (11/06 0410) SpO2:  [98 %-100 %] 100 % (11/06 0410) Weight:  [68.1 kg] 68.1 kg (11/06 0410) Physical Exam: General: Chronically ill-appearing, delirious, no acute distress Cardiovascular: Regular rate, irregular rhythm, no murmurs Respiratory: Clear, no wheezing, no increased work of breathing Abdomen: Soft, nontender, nondistended Extremities: No peripheral edema Neuro: Responds to voice and stimuli, speech is slurred and incoherent  Laboratory: Most recent CBC Lab Results  Component Value Date   WBC 6.3 05/25/2022   HGB 8.3 (L) 05/25/2022   HCT 25.0 (L) 05/25/2022   MCV  108.7 (H) 05/25/2022   PLT 263 05/25/2022   Most recent BMP    Latest Ref Rng & Units 05/25/2022    4:43 AM  BMP  Glucose 70 - 99 mg/dL 85   BUN 8 - 23 mg/dL <5   Creatinine 0.61 - 1.24 mg/dL 0.51   Sodium 135 - 145 mmol/L 136   Potassium 3.5 - 5.1 mmol/L 3.6   Chloride 98 - 111 mmol/L 106    CO2 22 - 32 mmol/L 22   Calcium 8.9 - 10.3 mg/dL 7.9    Darci Current, DO 05/25/2022, 9:31 AM  PGY-1, [place] Intern pager: [phone], text pages welcome Secure chat group [place]

## 2022-05-25 NOTE — Assessment & Plan Note (Signed)
Resolved.  Most likely due to sedation from Ativan and phenobarbital.  He is alert and oriented to self, place, situation. - discontinue Ativan - Continue phenobarb taper  - Obtain IV access, replete electrolytes as appropriate

## 2022-05-26 DIAGNOSIS — R410 Disorientation, unspecified: Secondary | ICD-10-CM | POA: Diagnosis not present

## 2022-05-26 LAB — BASIC METABOLIC PANEL
Anion gap: 9 (ref 5–15)
Anion gap: 12 (ref 5–15)
BUN: 5 mg/dL — ABNORMAL LOW (ref 8–23)
BUN: 13 mg/dL (ref 8–23)
CO2: 21 mmol/L — ABNORMAL LOW (ref 22–32)
CO2: 21 mmol/L — ABNORMAL LOW (ref 22–32)
Calcium: 8 mg/dL — ABNORMAL LOW (ref 8.9–10.3)
Calcium: 8.4 mg/dL — ABNORMAL LOW (ref 8.9–10.3)
Chloride: 106 mmol/L (ref 98–111)
Chloride: 104 mmol/L (ref 98–111)
Creatinine, Ser: 0.6 mg/dL — ABNORMAL LOW (ref 0.61–1.24)
Creatinine, Ser: 0.56 mg/dL — ABNORMAL LOW (ref 0.61–1.24)
GFR, Estimated: >60 mL/min (ref >60)
GFR, Estimated: >60 mL/min (ref >60)
Glucose, Bld: 85 mg/dL (ref 70–99)
Glucose, Bld: 96 mg/dL (ref 70–99)
Potassium: 3.7 mmol/L (ref 3.5–5.1)
Potassium: 3.8 mmol/L (ref 3.5–5.1)
Sodium: 136 mmol/L (ref 135–145)
Sodium: 137 mmol/L (ref 135–145)

## 2022-05-26 LAB — CBC
HCT: 26.3 % — ABNORMAL LOW (ref 39.0–52.0)
Hemoglobin: 8.6 g/dL — ABNORMAL LOW (ref 13.0–17.0)
MCH: 36.3 pg — ABNORMAL HIGH (ref 26.0–34.0)
MCHC: 32.7 g/dL (ref 30.0–36.0)
MCV: 111 fL — ABNORMAL HIGH (ref 80.0–100.0)
Platelets: 263 10*3/uL (ref 150–400)
RBC: 2.37 MIL/uL — ABNORMAL LOW (ref 4.22–5.81)
RDW: 16.7 % — ABNORMAL HIGH (ref 11.5–15.5)
WBC: 6.9 10*3/uL (ref 4.0–10.5)
nRBC: 0 % (ref 0.0–0.2)

## 2022-05-26 LAB — MAGNESIUM
Magnesium: 1.2 mg/dL — ABNORMAL LOW (ref 1.7–2.4)
Magnesium: 1.4 mg/dL — ABNORMAL LOW (ref 1.7–2.4)

## 2022-05-26 MED ORDER — MAGNESIUM OXIDE -MG SUPPLEMENT 400 (240 MG) MG PO TABS
400.0000 mg | ORAL_TABLET | Freq: Two times a day (BID) | ORAL | Status: DC
  Administered 2022-05-26: 400 mg via ORAL
  Filled 2022-05-26: qty 1

## 2022-05-26 MED ORDER — MAGNESIUM OXIDE -MG SUPPLEMENT 400 (240 MG) MG PO TABS
800.0000 mg | ORAL_TABLET | Freq: Two times a day (BID) | ORAL | Status: DC
  Administered 2022-05-26: 800 mg via ORAL
  Filled 2022-05-26: qty 2

## 2022-05-26 MED ORDER — LACTATED RINGERS IV SOLN: INTRAVENOUS | Status: DC

## 2022-05-26 MED ORDER — SODIUM CHLORIDE 0.9 % IV BOLUS
500.0000 mL | Freq: Once | INTRAVENOUS | Status: AC
  Administered 2022-05-26: 500 mL via INTRAVENOUS

## 2022-05-26 MED ORDER — MAGNESIUM SULFATE IN D5W 1-5 GM/100ML-% IV SOLN
1.0000 g | Freq: Once | INTRAVENOUS | Status: AC
  Administered 2022-05-26: 1 g via INTRAVENOUS
  Filled 2022-05-26 (×2): qty 100

## 2022-05-26 MED ORDER — MAGNESIUM OXIDE -MG SUPPLEMENT 400 (240 MG) MG PO TABS
800.0000 mg | ORAL_TABLET | Freq: Once | ORAL | Status: AC
  Administered 2022-05-26: 800 mg via ORAL
  Filled 2022-05-26: qty 2

## 2022-05-26 NOTE — Assessment & Plan Note (Addendum)
Mag 1.6 - ordered 2g Mag IV  - recheck daily

## 2022-05-26 NOTE — NC FL2 (Signed)
[place] LEVEL OF CARE SCREENING TOOL     IDENTIFICATION  Patient Name: Anthony [name] Birthdate: January 23, 1956 Sex: male Admission Date (Current Location): 05/19/2022  [facility] and [place] Number:  Herbalist and Address:  [facility] [place] [place] [address] [place], [place] [address]      Provider Number: [id number]  Attending Physician Name and Address:  Leeanne Rio, MD  Relative Name and Phone Number:       Current Level of Care: Hospital Recommended Level of Care: [place] Prior Approval Number:    Date Approved/Denied:   PASRR Number: PASRR under review  Discharge Plan: SNF    Current Diagnoses: Patient Active Problem List   Diagnosis Date Noted   Delirium 05/25/2022   Stricture and stenosis of esophagus 05/21/2022   Gastritis and gastroduodenitis 05/21/2022   Loose stools 05/20/2022   Malnutrition of moderate degree 05/20/2022   Acute on chronic congestive heart failure ([place])    Leukocytosis 05/19/2022   Sepsis ([place]) 05/19/2022   Cellulitis of left leg, possible 05/19/2022   Moniliasis, interdigital 05/19/2022   Open toe wound, right foot, medial fourth digit 05/19/2022   Malnutrition ([place]) 05/19/2022   Alcohol abuse 03/24/2022   Dyslipidemia 03/24/2022   Acute on chronic diastolic CHF (congestive heart failure) ([place]) 03/24/2022   Acute respiratory failure with hypoxia ([place]) 03/23/2022   Generalized weakness    Atrial fibrillation with RVR ([place]) 03/15/2022   Hypokalemia 03/15/2022   Iron deficiency anemia 10/16/2020   Cough    Hypomagnesemia    Protein-calorie malnutrition, severe 08/22/2020   Alcohol use disorder, severe, in controlled environment ([place])    Pressure injury of skin 08/18/2020   Urinary tract infection without hematuria    Severe sepsis ([place]) 08/14/2020   Paroxysmal atrial fibrillation ([place]) 08/13/2020   Rhabdomyolysis 07/22/2020   Cerebral ventriculomegaly 07/22/2020    Hemoglobin decreased 07/22/2020   History of delirium tremons 07/22/2020   Hypoalbuminemia due to protein-calorie malnutrition ([place]) 07/22/2020   Tobacco use disorder 07/22/2020   Alcohol withdrawal delirium ([place])    Dehydration    Alcohol abuse with alcohol-induced mood disorder ([place]) 07/18/2018   Depression 05/26/2018   Anxiety state 04/25/2018   Need for immunization against influenza 04/25/2018   Alcohol withdrawal ([place]) 09/01/2017   DOE (dyspnea on exertion) 11/27/2016   Actinic keratosis 11/27/2016   Macrocytic anemia 11/23/2016   History of pulmonary embolus (PE) 11/02/2016   Hiatal hernia 09/14/2016   Skin cancer 08/13/2016   Poor social situation 06/09/2013   Tinea versicolor 06/08/2013   Back pain 05/07/2013   Seborrheic dermatitis of scalp 11/08/2012   HTN (hypertension) 04/11/2012   Alcohol use disorder, severe, dependence ([place]) 09/16/2006    Orientation RESPIRATION BLADDER Height & Weight     Self, Place  Normal Incontinent, External catheter (External Urinary Catheter) Weight: 144 lb 6.4 oz (65.5 kg) Height:  '5\' 10"'$  (177.8 cm)  BEHAVIORAL SYMPTOMS/MOOD NEUROLOGICAL BOWEL NUTRITION STATUS      Incontinent Diet (Please see discharge summary)  AMBULATORY STATUS COMMUNICATION OF NEEDS Skin   Limited Assist Verbally Other (Comment) (appropriate for ethnicity,dry,ecchymosis,abdomen,L,Erythema,buttocks,bilateral,rash,buttocks,bilateral,wound/Incision LDAs)                       Personal Care Assistance Level of Assistance  Bathing, Feeding, Dressing Bathing Assistance: Limited assistance Feeding assistance: Independent (can feed self) Dressing Assistance: Limited assistance     Functional Limitations Info  Sight, Hearing, Speech Sight Info: Adequate (  WDL) Hearing Info: Adequate (WDL) Speech Info: Adequate    SPECIAL CARE FACTORS FREQUENCY  PT (By licensed PT), OT (By licensed OT)     PT Frequency: 5x min weekly OT Frequency: 5x min weekly             Contractures Contractures Info: Not present    Additional Factors Info  Code Status, Allergies Code Status Info: FULL Allergies Info: Other-Seasonal allergies- Itchy eyes, runny nose, congestion,Poison Ivy Extract           Current Medications (05/26/2022):  This is the current hospital active medication list Current Facility-Administered Medications  Medication Dose Route Frequency Provider Last Rate Last Admin   acetaminophen (TYLENOL) tablet 650 mg  650 mg Oral Q6H PRN [name], MD   650 mg at 05/26/22 0501   amiodarone (PACERONE) tablet 200 mg  200 mg Oral BID Darci Current, DO   200 mg at 05/26/22 1037   clotrimazole (LOTRIMIN) 1 % cream   Topical BID McDiarmid, Blane Ohara, MD   Given at 05/26/22 1038   diclofenac Sodium (VOLTAREN) 1 % topical gel 2 g  2 g Topical BID PRN Arlyce Dice, MD   2 g at 05/26/22 0452   feeding supplement (ENSURE ENLIVE / ENSURE PLUS) liquid 237 mL  237 mL Oral TID BM McDiarmid, Blane Ohara, MD   237 mL at 03/47/42 5956   folic acid (FOLVITE) tablet 1 mg  1 mg Oral Daily [name], MD   1 mg at 05/26/22 1036   Gerhardt's butt cream   Topical PRN Leeanne Rio, MD   Given at 05/26/22 3875   lactated ringers infusion   Intravenous Continuous Alesia Morin, MD 75 mL/hr at 05/26/22 1159 New Bag at 05/26/22 1159   magnesium oxide (MAG-OX) tablet 400 mg  400 mg Oral BID Darci Current, DO   400 mg at 05/26/22 1036   metoprolol succinate (TOPROL-XL) 24 hr tablet 12.5 mg  12.5 mg Oral Daily [name], MD   12.5 mg at 05/25/22 0950   multivitamin with minerals tablet 1 tablet  1 tablet Oral Daily [name], MD   1 tablet at 05/26/22 1035   nystatin (MYCOSTATIN/NYSTOP) topical powder   Topical PRN Coralyn Pear B, MD       pantoprazole (PROTONIX) injection 40 mg  40 mg Intravenous Q12H Ganta, Anupa, DO   40 mg at 05/26/22 1039   rivaroxaban (XARELTO) tablet 20 mg  20 mg Oral Q supper Coralyn Pear B, MD   20 mg at 05/26/22 1600    rosuvastatin (CRESTOR) tablet 10 mg  10 mg Oral Daily [name], MD   10 mg at 05/26/22 1037   sucralfate (CARAFATE) 1 GM/10ML suspension 1 g  1 g Oral TID WC & HS Willia Craze, NP   1 g at 05/26/22 1600   thiamine (VITAMIN B1) tablet 100 mg  100 mg Oral Daily [name], MD   100 mg at 05/26/22 1036     Discharge Medications: Please see discharge summary for a list of discharge medications.  Relevant Imaging Results:  Relevant Lab Results:   Additional Information [id number]  Milas Gain, LCSWA

## 2022-05-26 NOTE — Progress Notes (Signed)
Speech Language Pathology Treatment: Dysphagia  Patient Details Name: Anthony [name] MRN: [id number] DOB: [birth year] Today's Date: 05/26/2022 Time: 1200-1212 SLP Time Calculation (min) (ACUTE ONLY): 12 min  Assessment / Plan / Recommendation Clinical Impression  Pt's mentation continues to improve a [place] with each visit by this SLP, and per DO note today, suspected to be at his cognitive baseline. He is still confused but attends better to PO intake. He consumed thin liquids and purees with Mod I for swallowing. SLP tried to offer him a graham cracker, but pt reported "that would not be comfortable," referring to his lack of dentition. Note that in previous admissions it has also been challenging to decipher what his normal diet may consist of in terms of textures, but today he does not want to try anything other than purees. Will leave on Dys 1 diet and thin liquids. Suspect that this may remain the most appropriate diet for him, and if so, he may not have much further needs from SLP.    HPI HPI: Pt is a 66 yo male presenting with back pain, SOB, BLE swelling. Work up still underway but with concern for CHF exacerbation, sepsis. Pt found to have foreign body (suspected coin) in his distal esophagus with endoscopy currently on hold so cleared for clear liquids only per GI. Two prior swallow evals with functional appearing swallow in light of limited dentition, recommending Dys 3-regular solids and thin liquids. PMH includes: HH, EtOH and tobacco use, a-fib, anxiety, HFpEF, HTN, unhoused for the past year      SLP Plan  Continue with current plan of care      Recommendations for follow up therapy are one component of a multi-disciplinary discharge planning process, led by the attending physician.  Recommendations may be updated based on patient status, additional functional criteria and insurance authorization.    Recommendations  Diet recommendations: Dysphagia 1 (puree);Thin liquid Liquids  provided via: Cup;Straw Medication Administration: Crushed with puree Supervision: Patient able to self feed;Full supervision/cueing for compensatory strategies Compensations: Slow rate;Small sips/bites Postural Changes and/or Swallow Maneuvers: Seated upright 90 degrees;Upright 30-60 min after meal                Oral Care Recommendations: Oral care BID Follow Up Recommendations: No SLP follow up Assistance recommended at discharge: None SLP Visit Diagnosis: Dysphagia, unspecified (R13.10) Plan: Continue with current plan of care           [name]., M.A. Interior Office [phone]  Secure chat preferred   05/26/2022, 1:46 PM

## 2022-05-26 NOTE — Progress Notes (Signed)
Occupational Therapy Treatment Patient Details Name: Anthony [name] MRN: [id number] DOB: [birth year] Today's Date: 05/26/2022   History of present illness Pt is 66 y/o M admitted to [facility] 05/19/22 with LE swelling, back pain, SOB, workup for sepsis. Of note, pt with 5 ED visits in past 12 months. PMH to include afib with RVR, CHF, chronic LBP, PEs, depression, delirium, alcohol abuse, homelessness.   OT comments  Pt with slow progression towards goals, session limited by soft BP (see vitals below) and bowel incontinence. Pt needing min A for transfers to stand at take side steps at EOB, total A for pericare at bed level. Pt able to state he is at "the hospital" but remains disoriented to situation. Pt presenting with impairments listed below, will follow acutely. Continue to recommend SNF at d/c.  BP supine: 89/63 (72) BP seated EOB: 91/66 (75)   Recommendations for follow up therapy are one component of a multi-disciplinary discharge planning process, led by the attending physician.  Recommendations may be updated based on patient status, additional functional criteria and insurance authorization.    Follow Up Recommendations  Skilled nursing-short term rehab (<3 hours/day)    Assistance Recommended at Discharge Frequent or constant Supervision/Assistance  Patient can return home with the following  A lot of help with walking and/or transfers;A lot of help with bathing/dressing/bathroom;Assistance with cooking/housework;Direct supervision/assist for medications management;Direct supervision/assist for financial management;Assist for transportation;Help with stairs or ramp for entrance   Equipment Recommendations  None recommended by OT (defer)    Recommendations for Other Services PT consult    Precautions / Restrictions Precautions Precautions: Fall Restrictions Weight Bearing Restrictions: No       Mobility Bed Mobility Overal bed mobility: Needs Assistance Bed Mobility: Supine to  Sit, Sit to Supine     Supine to sit: Mod assist, Min assist Sit to supine: Min assist   General bed mobility comments: increased assist for trunk elevation    Transfers Overall transfer level: Needs assistance Equipment used: Rolling walker (2 wheels) Transfers: Sit to/from Stand Sit to Stand: Min assist, From elevated surface                 Balance Overall balance assessment: Needs assistance Sitting-balance support: Bilateral upper extremity supported, Feet supported, No upper extremity supported Sitting balance-Leahy Scale: Fair Sitting balance - Comments: supervision static sitting   Standing balance support: Bilateral upper extremity supported, During functional activity, Reliant on assistive device for balance Standing balance-Leahy Scale: Fair Standing balance comment: reliant on external support                           ADL either performed or assessed with clinical judgement   ADL Overall ADL's : Needs assistance/impaired                     Lower Body Dressing: Maximal assistance;Sit to/from stand;Sitting/lateral leans   Toilet Transfer: Minimal assistance;Rolling walker (2 wheels);Ambulation Toilet Transfer Details (indicate cue type and reason): simulated steps at [place] and Hygiene: Total assistance Toileting - Clothing Manipulation Details (indicate cue type and reason): pericare     Functional mobility during ADLs: Minimal assistance;+2 for physical assistance      Extremity/Trunk Assessment Upper Extremity Assessment Upper Extremity Assessment: Generalized weakness   Lower Extremity Assessment Lower Extremity Assessment: Defer to PT evaluation        Vision   Vision Assessment?: No apparent visual deficits   Perception  Perception Perception: Not tested   Praxis Praxis Praxis: Not tested    Cognition Arousal/Alertness: Awake/alert Behavior During Therapy: Flat affect Overall  Cognitive Status: No family/caregiver present to determine baseline cognitive functioning Area of Impairment: Attention, Memory, Following commands, Safety/judgement, Awareness, Problem solving                   Current Attention Level: Sustained Memory: Decreased short-term memory Following Commands: Follows one step commands inconsistently, Follows one step commands with increased time Safety/Judgement: Decreased awareness of safety, Decreased awareness of deficits Awareness: Emergent Problem Solving: Slow processing, Decreased initiation, Requires tactile cues, Requires verbal cues General Comments: Pt oriented to self, follows simple commands throughout session.        Exercises      Shoulder Instructions       General Comments BP 89/63 (72) supine, 91/66 (75) seated    Pertinent Vitals/ Pain       Pain Assessment Pain Assessment: No/denies pain Pain Score: 4  Faces Pain Scale: Hurts [place] more Pain Location: back pain Pain Descriptors / Indicators: Discomfort, Grimacing Pain Intervention(s): Limited activity within patient's tolerance, Monitored during session, Repositioned  Home Living Family/patient expects to be discharged to:: Shelter/Homeless                                 Additional Comments: encounter in ED states pt is homeless. multiple attempts to ask pt about plan for location after d/c, did not provide any information, stated "thats a great question" when inquiring about plan      Prior Functioning/Environment              Frequency  Min 2X/week        Progress Toward Goals  OT Goals(current goals can now be found in the care plan section)     Acute Rehab OT Goals Patient Stated Goal: none stated OT Goal Formulation: With patient Time For Goal Achievement: 06/03/22 Potential to Achieve Goals: Good ADL Goals Pt Will Perform Grooming: with modified independence;standing Pt Will Perform Upper Body Dressing: with min  guard assist;sitting Pt Will Perform Lower Body Dressing: with min guard assist;sitting/lateral leans;sit to/from stand Pt Will Transfer to Toilet: with supervision;regular height toilet;ambulating Additional ADL Goal #1: Pt will perform 2 step ADL task with min cues  Plan Discharge plan remains appropriate;Frequency remains appropriate    Co-evaluation                 AM-PAC OT "6 Clicks" Daily Activity     Outcome Measure   Help from another person eating meals?: A [place] Help from another person taking care of personal grooming?: A [place] Help from another person toileting, which includes using toliet, bedpan, or urinal?: Total Help from another person bathing (including washing, rinsing, drying)?: A Lot Help from another person to put on and taking off regular upper body clothing?: A Lot Help from another person to put on and taking off regular lower body clothing?: A Lot 6 Click Score: 13    End of Session Equipment Utilized During Treatment: Gait belt;Rolling walker (2 wheels)  OT Visit Diagnosis: Unsteadiness on feet (R26.81);Other abnormalities of gait and mobility (R26.89);Muscle weakness (generalized) (M62.81)   Activity Tolerance Patient tolerated treatment well   Patient Left in bed;with call bell/phone within reach;with bed alarm set;with nursing/sitter in room   Nurse Communication Mobility status;Other (comment) (soft BP)        Time: 0981-1914  OT Time Calculation (min): 31 min  Charges: OT General Charges $OT Visit: 1 Visit OT Treatments $Self Care/Home Management : 8-22 mins $Therapeutic Activity: 8-22 mins  Lynnda Child, OTD, OTR/L Acute Rehab [phone] - Guadalupe Guerra 05/26/2022, 12:16 PM

## 2022-05-26 NOTE — Progress Notes (Signed)
Daily Progress Note Intern Pager: [phone]  Patient name: Anthony [name] Medical record number: [id number] Date of birth: Dec 13, 1955 Age: 66 y.o. Gender: male  Primary Care Provider: Jacelyn Grip, MD Consultants: GI S/O, cardiology S/O Code Status: Full code  Pt Overview and Major Events to Date:  10/30: Admitted  Assessment and Plan:  Anthony [name] is a 66 y.o. male presenting with back pain, SOB, and BLE swelling.  Currently being treated for acute on chronic diastolic CHF, A-fib, alcohol withdrawal.  Pertinent PMH/PSH includes A-fib, HFpEF, hypertension, alcohol use disorder.       * Delirium Resolved.  Most likely due to sedation from Ativan and phenobarbital.  He is alert and oriented to self, place, situation. - discontinue Ativan - Continue phenobarb taper  - Obtain IV access, replete electrolytes as appropriate  Alcohol use disorder, severe, dependence ([place]) Last drink 10/30.  Should be through alcohol withdrawal.  Ental status improved and back to baseline. - Discontinue restraints - Cont Phenobarb taper  - Thiamine, folate, MVI  Sepsis ([place]) Resolved.  S/p antibiotics.  Labs, VS, and imaging stable.   - s/p Rocephin (10/31 - 11/4) - s/p Azithromycin (11/1 - 11/3) - monitor fever curve, if temp >100.4 re-culture   Acute on chronic diastolic CHF (congestive heart failure) (HCC) Mild trace LE edema on exam, no crackles on exam.  - consider restarting home lasix 20 mg BID - Continue home metoprolol 12.5 mg daily - Strict I/Os, daily weights  Atrial fibrillation with RVR (HCC) Rate controled.  -Cont amio PO 400 mg BID x5 days, 200 mg BID x7 day -Monitor electrolytes (Keep Mg>2 and K>4) -d/c anticoagulation at discharge  Hypomagnesemia Mag 1.2 - Patient does not have IV and is a hard stick.  - replete with 400 mg mag oxide BID  - recheck Mag at 1900  Macrocytic anemia Stable.  - d/c daily CBC  Poor social situation Currently unhoused. Likely  contributing to lack of follow up with PCP and inability to obtain medications, leading to recurrent hospitalizations and chronic disease exacerbations. -TOC consult for dispo planning    FEN/GI: Heart healthy, mechanically soft diet PPx: Xarelto Dispo:SNF tomorrow. Barriers include SNF placement.   Subjective:  No acute events overnight.  Patient's mental status has resolved.  He is oriented to self place and situation. Patient is back to cognitive baseline and will be medically stable for discharge to SNF pending bed availability.  Objective: Temp:  [98 F (36.7 C)-99.3 F (37.4 C)] 99.3 F (37.4 C) (11/07 0450) Pulse Rate:  [101] 101 (11/06 2052) Resp:  [20] 20 (11/07 0450) BP: (118-130)/(80-83) 118/80 (11/07 0450) SpO2:  [100 %] 100 % (11/06 2052) Weight:  [65.5 kg] 65.5 kg (11/07 0500) Physical Exam: General: Chronically ill-appearing, no acute distress Cardiovascular: Regular rate, irregular rhythm, no murmurs on exam Respiratory: Clear, no wheezing, no increased work of breathing Abdomen: Soft, nontender, nondistended Extremities: No peripheral edema  Laboratory: Most recent CBC Lab Results  Component Value Date   WBC 6.9 05/26/2022   HGB 8.6 (L) 05/26/2022   HCT 26.3 (L) 05/26/2022   MCV 111.0 (H) 05/26/2022   PLT 263 05/26/2022   Most recent BMP    Latest Ref Rng & Units 05/26/2022    2:41 AM  BMP  Glucose 70 - 99 mg/dL 85   BUN 8 - 23 mg/dL 5   Creatinine 0.61 - 1.24 mg/dL 0.60   Sodium 135 - 145 mmol/L 136   Potassium 3.5 -  5.1 mmol/L 3.7   Chloride 98 - 111 mmol/L 106   CO2 22 - 32 mmol/L 21   Calcium 8.9 - 10.3 mg/dL 8.0    Ammonia within normal limits, thiamine pending  Darci Current, DO 05/26/2022, 8:57 AM  PGY-1, [place] Intern pager: [phone], text pages welcome Secure chat group [place]

## 2022-05-26 NOTE — TOC Progression Note (Addendum)
Transition of Care [facility]) - Progression Note    Patient Details  Name: Anthony [name] MRN: [id number] Date of Birth: [birth year]  Transition of Care [facility] CM/SW [place], [place] Phone Number: 05/26/2022, 2:43 PM  Clinical Narrative:     .   CSW received consult for possible SNF placement at time of discharge.CSW spoke with patient at bedside. Due to patients current orientation .CSW spoke with patient and patients brother [name] regarding PT recommendation of SNF placement at time of discharge. Patient reports he is homeless. Patient and patients brother expressed understanding of PT recommendation and is agreeable to SNF placement at time of discharge for patient.Patient and patients brother [name] is agreeable to short term rehab and hopeful for LTC placement for patient, and gave CSW permission to fax out initial referral near the [place], [place], [place], [place], [place], [place] area.CSW discussed possible barriers to placement.CSW discussed insurance authorization process and will provide medicare compare snf ratings list with accepted SNF bed offers when available.  No further questions reported at this time. Patients passr currently under review. CSW submitted requested clinicals to [place] must for review.CSW to continue to follow and assist with discharge planning needs.   Expected Discharge Plan: [place] Barriers to Discharge: Continued Medical Work up  Expected Discharge Plan and Services Expected Discharge Plan: [place] In-house Referral: Clinical Social Work     Living arrangements for the past 2 months:  (homeless)                                       Social Determinants of Health (SDOH) Interventions Food Insecurity Interventions: Other (Comment) (Patient provided food resources) Transportation Interventions:  (Patient will need bus pass at dc)  Readmission Risk  Interventions    10/08/2020    2:48 PM  Readmission Risk Prevention Plan  Transportation Screening Complete  PCP or Specialist Appt within 3-5 Days Complete  HRI or [place] Complete  Social Work Consult for [place] Planning/Counseling Complete  Palliative Care Screening Complete  Medication Review Press photographer) Complete

## 2022-05-26 NOTE — Progress Notes (Signed)
Physical Therapy Treatment Patient Details Name: Anthony [name] MRN: [id number] DOB: Nov 18, 1955 Today's Date: 05/26/2022   History of Present Illness Pt is 66 y/o M admitted to [facility] 05/19/22 with LE swelling, back pain, SOB, workup for sepsis. Of note, pt with 5 ED visits in past 12 months. PMH to include afib with RVR, CHF, chronic LBP, PEs, depression, delirium, alcohol abuse, homelessness.    PT Comments    Pt received in bed and asking to get up and eat lunch. Pt needed min A to come to EOB, had some difficulty problem solving and sequencing task. Stood to [employer] with min A for posterior bias and very narrow BOS. Ambulated 5' with RW and min. Further ambulation deferred so pt could eat lunch. Also being followed by mobility team. Continue to recommend SNF at d/c. PT will continue to follow.    Recommendations for follow up therapy are one component of a multi-disciplinary discharge planning process, led by the attending physician.  Recommendations may be updated based on patient status, additional functional criteria and insurance authorization.  Follow Up Recommendations  Skilled nursing-short term rehab (<3 hours/day) Can patient physically be transported by private vehicle: Yes   Assistance Recommended at Discharge Intermittent Supervision/Assistance  Patient can return home with the following A [place] help with walking and/or transfers;A [place] help with bathing/dressing/bathroom;Assist for transportation;Help with stairs or ramp for entrance   Equipment Recommendations  Rolling walker (2 wheels)    Recommendations for Other Services       Precautions / Restrictions Precautions Precautions: Fall Precaution Comments: posey Restrictions Weight Bearing Restrictions: No     Mobility  Bed Mobility Overal bed mobility: Needs Assistance Bed Mobility: Supine to Sit     Supine to sit: Min assist     General bed mobility comments: vc's for initiation, pt able to bring LE's off bed  but needed tactile cues for problelm solving getting to edge and min A for elevation of trunk    Transfers Overall transfer level: Needs assistance Equipment used: Rolling walker (2 wheels) Transfers: Sit to/from Stand, Bed to chair/wheelchair/BSC Sit to Stand: Min assist   Step pivot transfers: Min assist       General transfer comment: vc's for hand placement. Min A for safety, increased time needed    Ambulation/Gait Ambulation/Gait assistance: Min assist Gait Distance (Feet): 5 Feet Assistive device: Rolling walker (2 wheels) Gait Pattern/deviations: Narrow base of support, Decreased stride length, Shuffle Gait velocity: decreased Gait velocity interpretation: <1.31 ft/sec, indicative of household ambulator   General Gait Details: further walking deferred as pt wanting to get to chair to eat lunch. Focused on balance within RW, pt unsteady   Stairs             Wheelchair Mobility    Modified Rankin (Stroke Patients Only)       Balance Overall balance assessment: Needs assistance Sitting-balance support: Bilateral upper extremity supported, Feet supported, No upper extremity supported Sitting balance-Leahy Scale: Fair Sitting balance - Comments: supervision static sitting   Standing balance support: Bilateral upper extremity supported, During functional activity, Reliant on assistive device for balance Standing balance-Leahy Scale: Poor Standing balance comment: stands with very narrow BOS and posterior bias                            Cognition Arousal/Alertness: Awake/alert Behavior During Therapy: Flat affect Overall Cognitive Status: No family/caregiver present to determine baseline cognitive functioning Area of Impairment: Attention,  Memory, Following commands, Safety/judgement, Awareness, Problem solving                   Current Attention Level: Sustained Memory: Decreased short-term memory Following Commands: Follows one step  commands inconsistently, Follows one step commands with increased time Safety/Judgement: Decreased awareness of safety, Decreased awareness of deficits Awareness: Emergent Problem Solving: Slow processing, Decreased initiation, Requires tactile cues, Requires verbal cues General Comments: Pt oriented to self and location. Follows all commands throughout session. Slow processing. Sometimes difficult to understand due to missing teeth        Exercises      General Comments General comments (skin integrity, edema, etc.): VSS on RA      Pertinent Vitals/Pain Pain Assessment Pain Assessment: Faces Faces Pain Scale: Hurts [place] more Pain Location: back pain Pain Descriptors / Indicators: Discomfort, Grimacing Pain Intervention(s): Limited activity within patient's tolerance, Monitored during session, Repositioned    Home Living                          Prior Function            PT Goals (current goals can now be found in the care plan section) Acute Rehab PT Goals Patient Stated Goal: to have less back pain PT Goal Formulation: With patient Time For Goal Achievement: 06/03/22 Potential to Achieve Goals: Fair Progress towards PT goals: Progressing toward goals    Frequency    Min 3X/week      PT Plan Current plan remains appropriate    Co-evaluation              AM-PAC PT "6 Clicks" Mobility   Outcome Measure  Help needed turning from your back to your side while in a flat bed without using bedrails?: None Help needed moving from lying on your back to sitting on the side of a flat bed without using bedrails?: A [place] Help needed moving to and from a bed to a chair (including a wheelchair)?: A [place] Help needed standing up from a chair using your arms (e.g., wheelchair or bedside chair)?: A [place] Help needed to walk in hospital room?: A [place] Help needed climbing 3-5 steps with a railing? : A Lot 6 Click Score: 18    End of Session Equipment  Utilized During Treatment: Gait belt Activity Tolerance: Patient tolerated treatment well Patient left: with call bell/phone within reach;in chair;Other (comment) (posey chair belt placed around pt and plugged into monitor) Nurse Communication: Mobility status PT Visit Diagnosis: Other abnormalities of gait and mobility (R26.89);Unsteadiness on feet (R26.81)     Time: 8675-4492 PT Time Calculation (min) (ACUTE ONLY): 22 min  Charges:  $Therapeutic Activity: 8-22 mins                    Leighton Roach, PT  Acute Rehab Services Secure chat preferred Office [place] 05/26/2022, 4:09 PM

## 2022-05-26 NOTE — Progress Notes (Addendum)
RE: Anthony [name]  Date of Birth: 05-Dec-1955  Date: 05/26/2022  To Whom It May Concern:  Please be advised that the above-named patient will require a short-term nursing home stay - anticipated 30 days or less for rehabilitation and strengthening. The plan is for return home.

## 2022-05-27 DIAGNOSIS — R410 Disorientation, unspecified: Secondary | ICD-10-CM | POA: Diagnosis not present

## 2022-05-27 LAB — BASIC METABOLIC PANEL
Anion gap: 11 (ref 5–15)
BUN: 10 mg/dL (ref 8–23)
CO2: 18 mmol/L — ABNORMAL LOW (ref 22–32)
Calcium: 7.9 mg/dL — ABNORMAL LOW (ref 8.9–10.3)
Chloride: 105 mmol/L (ref 98–111)
Creatinine, Ser: 0.52 mg/dL — ABNORMAL LOW (ref 0.61–1.24)
GFR, Estimated: >60 mL/min (ref >60)
Glucose, Bld: 80 mg/dL (ref 70–99)
Potassium: 4 mmol/L (ref 3.5–5.1)
Sodium: 134 mmol/L — ABNORMAL LOW (ref 135–145)

## 2022-05-27 LAB — CBC
HCT: 22.3 % — ABNORMAL LOW (ref 39.0–52.0)
Hemoglobin: 7.4 g/dL — ABNORMAL LOW (ref 13.0–17.0)
MCH: 36.3 pg — ABNORMAL HIGH (ref 26.0–34.0)
MCHC: 33.2 g/dL (ref 30.0–36.0)
MCV: 109.3 fL — ABNORMAL HIGH (ref 80.0–100.0)
Platelets: 264 10*3/uL (ref 150–400)
RBC: 2.04 MIL/uL — ABNORMAL LOW (ref 4.22–5.81)
RDW: 16.4 % — ABNORMAL HIGH (ref 11.5–15.5)
WBC: 5.6 10*3/uL (ref 4.0–10.5)
nRBC: 0 % (ref 0.0–0.2)

## 2022-05-27 LAB — MAGNESIUM
Magnesium: 1.1 mg/dL — ABNORMAL LOW (ref 1.7–2.4)
Magnesium: 2 mg/dL (ref 1.7–2.4)

## 2022-05-27 LAB — VITAMIN B1: Vitamin B1 (Thiamine): 141.6 nmol/L (ref 66.5–200.0)

## 2022-05-27 MED ORDER — MAGNESIUM SULFATE 2 GM/50ML IV SOLN
2.0000 g | Freq: Once | INTRAVENOUS | Status: AC
  Administered 2022-05-27: 2 g via INTRAVENOUS
  Filled 2022-05-27: qty 50

## 2022-05-27 MED ORDER — METHOCARBAMOL 500 MG PO TABS
750.0000 mg | ORAL_TABLET | Freq: Three times a day (TID) | ORAL | Status: DC
  Administered 2022-05-27 – 2022-05-28 (×2): 750 mg via ORAL
  Filled 2022-05-27 (×2): qty 2

## 2022-05-27 NOTE — Progress Notes (Signed)
Mobility Specialist - Progress Note   05/27/22 1009  Mobility  Activity Refused mobility   Pt refused mobility stating he had a lot of phone calls to make right now and would like for me to come back later. Will follow up. Larey Seat

## 2022-05-27 NOTE — TOC Progression Note (Addendum)
Transition of Care [facility]) - Progression Note    Patient Details  Name: Anthony [name] MRN: [id number] Date of Birth: [birth year]  Transition of Care [facility]) CM/SW [place], [place] Phone Number: 05/27/2022, 11:07 AM  Clinical Narrative:     Update- CSW spoke with [name] with [facility] who confirmed will have central intake look at referral to see if they can offer SNF bed for patient. CSW will continue to follow.   CSW received message from [facility] with [facility] that patient was denied for for SNF placement. Patient has no current SNF bed offers. CSW will continue to follow and assist with patients dc planning needs. CSW received passr for patient [phone] [name] with [facility] is looking at patient for possible rehab then LTC for patient. [name] confirmed will get back with CSW to let CSW know if she can offer bed after speaks with DON. CSW will continue to follow and assist with patients dc planning needs.  Expected Discharge Plan: [place] Barriers to Discharge: Continued Medical Work up  Expected Discharge Plan and Services Expected Discharge Plan: [place] In-house Referral: Clinical Social Work     Living arrangements for the past 2 months:  (homeless)                                       Social Determinants of Health (SDOH) Interventions Food Insecurity Interventions: Other (Comment) (Patient provided food resources) Transportation Interventions:  (Patient will need bus pass at dc)  Readmission Risk Interventions    10/08/2020    2:48 PM  Readmission Risk Prevention Plan  Transportation Screening Complete  PCP or Specialist Appt within 3-5 Days Complete  HRI or [place] Complete  Social Work Consult for [place] Planning/Counseling Complete  Palliative Care Screening Complete  Medication Review Press photographer) Complete

## 2022-05-27 NOTE — Assessment & Plan Note (Addendum)
Hgb stably low. Component of IDA. -Start daily iron supplement - transfuse if Hgb <7

## 2022-05-27 NOTE — Progress Notes (Signed)
     Daily Progress Note Intern Pager: [phone]  Patient name: Anthony [name] Medical record number: [id number] Date of birth: 05-27-1956 Age: 66 y.o. Gender: male  Primary Care Provider: Jacelyn Grip, MD Consultants: GI, cardiology Code Status: Full code  Pt Overview and Major Events to Date:  10/30: Admitted  Assessment and Plan:  Slayde A Bourget is a 66 y.o. male presenting with back pain, SOB, and BLE swelling.  Currently being treated for acute on chronic diastolic CHF, A-fib, alcohol withdrawal.  Pertinent PMH/PSH includes A-fib, HFpEF, hypertension, alcohol use disorder.      Alcohol use disorder, severe, dependence ([place]) Through withdrawal.  - TOC help with resources for maintaining sobriety  - Thiamine, folate, MVI  Acute on chronic diastolic CHF (congestive heart failure) (HCC) Mild trace LE edema on exam, no crackles on exam.  - consider restarting home lasix 20 mg BID - Continue home metoprolol 12.5 mg daily - Strict I/Os, daily weights  Atrial fibrillation with RVR (HCC) Rate controled.  -Cont amio PO 400 mg BID x5 days, 200 mg BID x7 day -Monitor electrolytes (Keep Mg>2 and K>4) -d/c anticoagulation at discharge  Hypomagnesemia Mag 1.1 - replete with 400 mg mag oxide BID  - ordered 2g Mag IV  - recheck at 1500 today   Macrocytic anemia Hgb 8.6>7.4.  - check ferritin for iron deficiency as patient complaining of leg cramping.  - recheck CBC tomorrow  - transfuse if Hgb <7   Poor social situation Currently unhoused. Likely contributing to lack of follow up with PCP and inability to obtain medications, leading to recurrent hospitalizations and chronic disease exacerbations. -TOC consult for dispo planning  - planning SNF placement for dispo   FEN/GI: Heart healthy, mechanically soft diet PPx: Xarelto Dispo:SNF  pending placement . Barriers include none.   Subjective:  No acute events overnight.  Patient's mental status continues to improve.  He is  oriented to self, place, and situation.  He is not very redirectable.    Objective: Temp:  [97.4 F (36.3 C)-98.1 F (36.7 C)] 97.9 F (36.6 C) (11/08 0823) Pulse Rate:  [78-100] 78 (11/08 0823) Resp:  [13-20] 15 (11/08 0823) BP: (80-107)/(57-76) 103/68 (11/08 0823) SpO2:  [100 %] 100 % (11/08 0823) Weight:  [67.7 kg] 67.7 kg (11/08 0554) Physical Exam: General: Chronically ill-appearing, no acute distress, deconditioned Cardiovascular: Regular rate, regular rhythm, no murmurs on exam Respiratory: Clear, no wheezing, no consolidations.  No increased work of breathing Abdomen: Soft, nontender Extremities: Deconditioned, no peripheral edema  Laboratory: Most recent CBC Lab Results  Component Value Date   WBC 5.6 05/27/2022   HGB 7.4 (L) 05/27/2022   HCT 22.3 (L) 05/27/2022   MCV 109.3 (H) 05/27/2022   PLT 264 05/27/2022   Most recent BMP    Latest Ref Rng & Units 05/27/2022    3:20 AM  BMP  Glucose 70 - 99 mg/dL 80   BUN 8 - 23 mg/dL 10   Creatinine 0.61 - 1.24 mg/dL 0.52   Sodium 135 - 145 mmol/L 134   Potassium 3.5 - 5.1 mmol/L 4.0   Chloride 98 - 111 mmol/L 105   CO2 22 - 32 mmol/L 18   Calcium 8.9 - 10.3 mg/dL 7.9    Mag 1.1  Darci Current, DO 05/27/2022, 8:57 AM  PGY-1, [place] Intern pager: [phone], text pages welcome Secure chat group [place]

## 2022-05-27 NOTE — Progress Notes (Signed)
Nutrition Follow-up  DOCUMENTATION CODES:   Non-severe (moderate) malnutrition in context of social or environmental circumstances  INTERVENTION:  Continue diet recommendations per ST Ensure Enlive po TID, each supplement provides 350 kcal and 20 grams of protein Magic cup TID with meals, each supplement provides 290 kcal and 9 grams of protein  NUTRITION DIAGNOSIS:   Moderate Malnutrition related to social / environmental circumstances (homeless, etoh use) as evidenced by moderate fat depletion, severe muscle depletion, moderate muscle depletion.  ongoing  GOAL:   Patient will meet greater than or equal to 90% of their needs  Progressing via diet advancement and nutrition supplements  MONITOR:   PO intake, Supplement acceptance, Diet advancement, Labs, Weight trends  REASON FOR ASSESSMENT:   Consult Assessment of nutrition requirement/status  ASSESSMENT:   Pt admitted d/t back pain, SOB and BLE swelling, found to be septic. PMH significant for CHF, afib, alcohol use disorder.  TOC working on SNF placement.   Pt sitting in bed with breakfast tray on table. He completed all but his pureed sausage. He mentioned that these were not appealing. Pt assessed by ST yesterday. At that time he declined trying a graham cracker d/t discomfort with poor dentition. He believes that he could tolerate minced meats. ST planning to follow up with pt tomorrow on diet tolerance.     Medications: folvite, MVI, protonix, carafate, thiamine IV drips: LR @ 8m/hr, Mg sulfate  Labs: sodium 134, Cr 0.52, Ca 7.9, Mg 1.1 (repletion)  Diet Order:   Diet Order             DIET - DYS 1 Room service appropriate? No; Fluid consistency: Thin  Diet effective now                   EDUCATION NEEDS:   Not appropriate for education at this time  Skin:  Skin Assessment: Reviewed RN Assessment  Last BM:  11/1 (type 7)  Height:   Ht Readings from Last 1 Encounters:  05/21/22 '5\' 10"'$   (1.778 m)    Weight:   Wt Readings from Last 1 Encounters:  05/27/22 67.7 kg    Ideal Body Weight:  75.5 kg  BMI:  Body mass index is 21.41 kg/m.  Estimated Nutritional Needs:   Kcal:  1900-2100  Protein:  95-110g  Fluid:  >/=1.9L  AClayborne Dana RDN, LDN Clinical Nutrition

## 2022-05-28 DIAGNOSIS — R410 Disorientation, unspecified: Secondary | ICD-10-CM | POA: Diagnosis not present

## 2022-05-28 LAB — CBC
HCT: 24.4 % — ABNORMAL LOW (ref 39.0–52.0)
Hemoglobin: 8 g/dL — ABNORMAL LOW (ref 13.0–17.0)
MCH: 35.9 pg — ABNORMAL HIGH (ref 26.0–34.0)
MCHC: 32.8 g/dL (ref 30.0–36.0)
MCV: 109.4 fL — ABNORMAL HIGH (ref 80.0–100.0)
Platelets: 307 10*3/uL (ref 150–400)
RBC: 2.23 MIL/uL — ABNORMAL LOW (ref 4.22–5.81)
RDW: 16.1 % — ABNORMAL HIGH (ref 11.5–15.5)
WBC: 5.5 10*3/uL (ref 4.0–10.5)
nRBC: 0 % (ref 0.0–0.2)

## 2022-05-28 LAB — IRON AND TIBC
Iron: 30 ug/dL — ABNORMAL LOW (ref 45–182)
Saturation Ratios: 11 % — ABNORMAL LOW (ref 17.9–39.5)
TIBC: 265 ug/dL (ref 250–450)
UIBC: 235 ug/dL

## 2022-05-28 LAB — BASIC METABOLIC PANEL
Anion gap: 7 (ref 5–15)
BUN: 6 mg/dL — ABNORMAL LOW (ref 8–23)
CO2: 23 mmol/L (ref 22–32)
Calcium: 7.8 mg/dL — ABNORMAL LOW (ref 8.9–10.3)
Chloride: 100 mmol/L (ref 98–111)
Creatinine, Ser: 0.53 mg/dL — ABNORMAL LOW (ref 0.61–1.24)
GFR, Estimated: >60 mL/min (ref >60)
Glucose, Bld: 81 mg/dL (ref 70–99)
Potassium: 4.1 mmol/L (ref 3.5–5.1)
Sodium: 130 mmol/L — ABNORMAL LOW (ref 135–145)

## 2022-05-28 LAB — FERRITIN: Ferritin: 88 ng/mL (ref 24–336)

## 2022-05-28 LAB — MAGNESIUM: Magnesium: 1.4 mg/dL — ABNORMAL LOW (ref 1.7–2.4)

## 2022-05-28 MED ORDER — MAGNESIUM SULFATE 2 GM/50ML IV SOLN
2.0000 g | Freq: Once | INTRAVENOUS | Status: AC
  Administered 2022-05-28: 2 g via INTRAVENOUS
  Filled 2022-05-28: qty 50

## 2022-05-28 MED ORDER — SODIUM CHLORIDE 0.9 % IV SOLN
250.0000 mg | Freq: Once | INTRAVENOUS | Status: AC
  Administered 2022-05-28: 250 mg via INTRAVENOUS
  Filled 2022-05-28: qty 20

## 2022-05-28 MED ORDER — SODIUM CHLORIDE 0.9 % IV SOLN: INTRAVENOUS | Status: DC

## 2022-05-28 MED ORDER — METHOCARBAMOL 500 MG PO TABS
750.0000 mg | ORAL_TABLET | Freq: Three times a day (TID) | ORAL | Status: DC | PRN
  Administered 2022-05-29 – 2022-05-30 (×2): 750 mg via ORAL
  Filled 2022-05-28 (×3): qty 2

## 2022-05-28 NOTE — Progress Notes (Signed)
Speech Language Pathology Treatment: Dysphagia  Patient Details Name: Anthony [name] MRN: [id number] DOB: May 03, 1956 Today's Date: 05/28/2022 Time: 1445-1501 SLP Time Calculation (min) (ACUTE ONLY): 16 min  Assessment / Plan / Recommendation Clinical Impression  Pt says he has been very eager to have a more solid diet, insisting on upgrading to the "regular menu." Described the mechanical soft diet he has previously been on here but he declines this as well, saying again that he wants the regular menu. SLP provided trials of regular solids, which he consumed without overt difficulty. Will advance him to regular diet, maintaining thin liquids.    HPI HPI: Pt is a 66 yo male presenting with back pain, SOB, BLE swelling. Work up still underway but with concern for CHF exacerbation, sepsis. Pt found to have foreign body (suspected coin) in his distal esophagus with endoscopy currently on hold so cleared for clear liquids only per GI. Two prior swallow evals with functional appearing swallow in light of limited dentition, recommending Dys 3-regular solids and thin liquids. PMH includes: HH, EtOH and tobacco use, a-fib, anxiety, HFpEF, HTN, unhoused for the past year      SLP Plan  Continue with current plan of care      Recommendations for follow up therapy are one component of a multi-disciplinary discharge planning process, led by the attending physician.  Recommendations may be updated based on patient status, additional functional criteria and insurance authorization.    Recommendations  Diet recommendations: Regular;Thin liquid Liquids provided via: Cup;Straw Medication Administration: Whole meds with puree Supervision: Patient able to self feed;Full supervision/cueing for compensatory strategies Compensations: Slow rate;Small sips/bites Postural Changes and/or Swallow Maneuvers: Seated upright 90 degrees;Upright 30-60 min after meal                Oral Care Recommendations: Oral care  BID Follow Up Recommendations: No SLP follow up Assistance recommended at discharge: None SLP Visit Diagnosis: Dysphagia, unspecified (R13.10) Plan: Continue with current plan of care           Anthony [name]., M.A. Anthony [name] Office [phone]  Secure chat preferred   05/28/2022, 3:36 PM

## 2022-05-28 NOTE — TOC Progression Note (Signed)
Transition of Care [facility]) - Progression Note    Patient Details  Name: Anthony [name] MRN: [id number] Date of Birth: [birth year]  Transition of Care [facility]) CM/SW [place], [place] Phone Number: 05/28/2022, 1:07 PM  Clinical Narrative:     Jackelyn Poling with [facility] confirmed [place] with [place] is going to come and see patient to determine if they can offer SNF bed for patient. [facility] confirmed will follow up with CSW on decision. CSW will continue to follow and assist with patients dc planning needs.  Expected Discharge Plan: [place] Barriers to Discharge: Continued Medical Work up  Expected Discharge Plan and Services Expected Discharge Plan: [place] In-house Referral: Clinical Social Work     Living arrangements for the past 2 months:  (homeless)                                       Social Determinants of Health (SDOH) Interventions Food Insecurity Interventions: Other (Comment) (Patient provided food resources) Transportation Interventions:  (Patient will need bus pass at dc)  Readmission Risk Interventions    10/08/2020    2:48 PM  Readmission Risk Prevention Plan  Transportation Screening Complete  PCP or Specialist Appt within 3-5 Days Complete  HRI or [place] Complete  Social Work Consult for [place] Planning/Counseling Complete  Palliative Care Screening Complete  Medication Review Press photographer) Complete

## 2022-05-28 NOTE — Progress Notes (Signed)
     Daily Progress Note Intern Pager: [phone]  Patient name: Anthony [name] [place] Medical record number: [id number] Date of birth: 1955/09/25 Age: 66 y.o. Gender: male  Primary Care Provider: Jacelyn Grip, MD Consultants: GI, cardiology Code Status: Full code  Pt Overview and Major Events to Date:  10/30: Admitted  Assessment and Plan:  Anthony [name] is a 66 y.o. male presenting with back pain, SOB, and BLE swelling.  Currently being treated for acute on chronic diastolic CHF, A-fib, alcohol withdrawal.  Pertinent PMH/PSH includes A-fib, HFpEF, hypertension, alcohol use disorder.       Alcohol use disorder, severe, dependence ([place]) Through withdrawal window. - TOC help with resources for maintaining sobriety  - Thiamine, folate, MVI  Acute on chronic diastolic CHF (congestive heart failure) (HCC) Mild trace LE edema on exam, no crackles on exam.  - consider restarting home lasix 20 mg BID - Continue home metoprolol 12.5 mg daily - Strict I/Os, daily weights  Atrial fibrillation with RVR (HCC) Rate controled.  - 200 mg BID x7 day, followed by 200 mg daily -Monitor electrolytes (Keep Mg>2 and K>4) -d/c anticoagulation at discharge  Hypomagnesemia Mag 1.4 - ordered 2g Mag IV  - recheck daily  Macrocytic anemia Hgb 8.6>7.4>8.0 - Iron 30, ferritin 88, Tsat 11 - IV iron repletion  - transfuse if Hgb <7   Poor social situation Currently unhoused. Likely contributing to lack of follow up with PCP and inability to obtain medications, leading to recurrent hospitalizations and chronic disease exacerbations. -TOC consult for dispo planning  - planning SNF placement for dispo   FEN/GI: Heart healthy, mechanically soft diet PPx: Xarelto Dispo:CIR  pending bed availability . Barriers include SNF bed placement.   Subjective:  Patient resting comfortably.  No acute events overnight.  Awaiting SNF placement.  Objective: Temp:  [98 F (36.7 C)-98.7 F (37.1 C)] 98 F (36.7 C)  (11/09 0612) Pulse Rate:  [80-91] 80 (11/09 0612) Resp:  [16] 16 (11/09 0612) BP: (101-110)/(69-84) 110/84 (11/09 0612) SpO2:  [99 %-100 %] 99 % (11/09 0612) Weight:  [70 kg] 70 kg (11/09 0612) Physical Exam: General: Chronically ill-appearing, no acute distress Cardiovascular: Regular rate, regular them, no murmurs on exam.  Patient converted to sinus rhythm overnight Respiratory: Clear, no wheezing, no increased work of breathing Abdomen: Soft, nontender, nondistended Extremities: No peripheral edema  Laboratory: Most recent CBC Lab Results  Component Value Date   WBC 5.5 05/28/2022   HGB 8.0 (L) 05/28/2022   HCT 24.4 (L) 05/28/2022   MCV 109.4 (H) 05/28/2022   PLT 307 05/28/2022   Most recent BMP    Latest Ref Rng & Units 05/28/2022    2:28 AM  BMP  Glucose 70 - 99 mg/dL 81   BUN 8 - 23 mg/dL 6   Creatinine 0.61 - 1.24 mg/dL 0.53   Sodium 135 - 145 mmol/L 130   Potassium 3.5 - 5.1 mmol/L 4.1   Chloride 98 - 111 mmol/L 100   CO2 22 - 32 mmol/L 23   Calcium 8.9 - 10.3 mg/dL 7.8     Darci Current, DO 05/28/2022, 8:24 AM  PGY-1, [place] Intern pager: [phone], text pages welcome Secure chat group [place]

## 2022-05-28 NOTE — Progress Notes (Signed)
Mobility Specialist - Progress Note   05/28/22 1115  Mobility  Activity Transferred from bed to chair  Level of Assistance Contact guard assist, steadying assist  Assistive Device Front wheel walker  Activity Response Tolerated well  Mobility Referral Yes  $Mobility charge 1 Mobility   Pt received in bed and agreeable to transfer. No complaints throughout. Pt left in chair with all needs met and alarm on.   Franki Monte  Mobility Specialist Please contact via Solicitor or Rehab office at [phone]

## 2022-05-28 NOTE — Progress Notes (Signed)
Physical Therapy Treatment Patient Details Name: Anthony [name] MRN: [id number] DOB: 1956/01/08 Today's Date: 05/28/2022   History of Present Illness Pt is 66 y/o M admitted to [facility] 05/19/22 with LE swelling, back pain, SOB, workup for sepsis. Of note, pt with 5 ED visits in past 12 months. PMH to include afib with RVR, CHF, chronic LBP, PEs, depression, delirium, alcohol abuse, homelessness.    PT Comments    Pt has been trying to get out of bed all morning after a night of [place] to no sleep. Pt preoccupied with "going on a trip", which is "top secret". Also, reports at one point he is [facility]. While conversing with pt and getting up to walk in a way that is related to his ongoing story. Pt requires min assist for management of lines and leads for safety, and close guard for safety. Pt with decreased knowledge of RW use. D/c plan remains appropriate at this time. PT will continue to follow acutely.   Recommendations for follow up therapy are one component of a multi-disciplinary discharge planning process, led by the attending physician.  Recommendations may be updated based on patient status, additional functional criteria and insurance authorization.  Follow Up Recommendations  Skilled nursing-short term rehab (<3 hours/day) Can patient physically be transported by private vehicle: Yes   Assistance Recommended at Discharge Intermittent Supervision/Assistance  Patient can return home with the following A [place] help with walking and/or transfers;A [place] help with bathing/dressing/bathroom;Assist for transportation;Help with stairs or ramp for entrance   Equipment Recommendations  Rolling walker (2 wheels)       Precautions / Restrictions Precautions Precautions: Fall Precaution Comments: posey Restrictions Weight Bearing Restrictions: No     Mobility  Bed Mobility Overal bed mobility: Needs Assistance Bed Mobility: Supine to Sit     Supine to sit: Min guard, Min assist      General bed mobility comments: min guard for safety, requries minA for management of lines that are twisted around him    Transfers Overall transfer level: Needs assistance Equipment used: Rolling walker (2 wheels) Transfers: Sit to/from Stand, Bed to chair/wheelchair/BSC Sit to Stand: Min guard           General transfer comment: min guard for safety, good power up, instructed to reach for RW to improve balance    Ambulation/Gait Ambulation/Gait assistance: Min assist Gait Distance (Feet): 100 Feet Assistive device: Rolling walker (2 wheels) Gait Pattern/deviations: Narrow base of support, Decreased stride length, Shuffle Gait velocity: decreased Gait velocity interpretation: <1.8 ft/sec, indicate of risk for recurrent falls   General Gait Details: pt with decreased knowledge of RW use, constant multimodal cues for proximity to RW, pt preoccupied with getting back to room to "get ready for his trip"       Balance Overall balance assessment: Needs assistance Sitting-balance support: Bilateral upper extremity supported, Feet supported, No upper extremity supported Sitting balance-Leahy Scale: Fair Sitting balance - Comments: supervision static sitting   Standing balance support: Bilateral upper extremity supported, During functional activity, Reliant on assistive device for balance Standing balance-Leahy Scale: Poor Standing balance comment: improved BoS and steadiness today                            Cognition Arousal/Alertness: Awake/alert Behavior During Therapy: Flat affect Overall Cognitive Status: No family/caregiver present to determine baseline cognitive functioning Area of Impairment: Attention, Memory, Following commands, Safety/judgement, Awareness, Problem solving  Current Attention Level: Sustained Memory: Decreased short-term memory Following Commands: Follows one step commands inconsistently, Follows one step commands  with increased time Safety/Judgement: Decreased awareness of safety, Decreased awareness of deficits Awareness: Emergent Problem Solving: Slow processing, Decreased initiation, Requires tactile cues, Requires verbal cues General Comments: Pt oriented to self and location. Pt thinks he is [place] and keeps trying to get up from chair as he needs to get ready for top secret trip, in vehicle that needs to carry 100 thousand lbs. Follows all commands but is preoccupied with "missing trip that leaves in 2 hours"           General Comments General comments (skin integrity, edema, etc.): VSS on RA      Pertinent Vitals/Pain Pain Assessment Pain Assessment: Faces Pain Score: 0-No pain Faces Pain Scale: Hurts Levels more Pain Intervention(s): Monitored during session     PT Goals (current goals can now be found in the care plan section) Acute Rehab PT Goals Patient Stated Goal: to have less back pain PT Goal Formulation: With patient Time For Goal Achievement: 06/03/22 Potential to Achieve Goals: Fair Progress towards PT goals: Progressing toward goals    Frequency    Min 3X/week      PT Plan Current plan remains appropriate       AM-PAC PT "6 Clicks" Mobility   Outcome Measure  Help needed turning from your back to your side while in a flat bed without using bedrails?: None Help needed moving from lying on your back to sitting on the side of a flat bed without using bedrails?: None Help needed moving to and from a bed to a chair (including a wheelchair)?: A Kr[place] Help needed standing up from a chair using your arms (e.g., wheelchair or bedside chair)?: A [place] Help needed to walk in hospital room?: A [place] Help needed climbing 3-5 steps with a railing? : A Lot 6 Click Score: 19    End of Session Equipment Utilized During Treatment: Gait belt Activity Tolerance: Patient tolerated treatment well Patient left: with call bell/phone within reach;in chair;Other (comment)  (posey chair belt placed around pt and plugged into monitor) Nurse Communication: Mobility status PT Visit Diagnosis: Other abnormalities of gait and mobility (R26.89);Unsteadiness on feet (R26.81)     Time: 2694-8546 PT Time Calculation (min) (ACUTE ONLY): 33 min  Charges:  $Gait Training: 8-22 mins $Therapeutic Activity: 8-22 mins                     Aarib Pulido B. Migdalia Dk PT, DPT Acute Rehabilitation Services Please use secure chat or  Call Office [phone]    [place] 05/28/2022, 10:08 AM

## 2022-05-29 DIAGNOSIS — R410 Disorientation, unspecified: Secondary | ICD-10-CM | POA: Diagnosis not present

## 2022-05-29 LAB — BASIC METABOLIC PANEL
Anion gap: 7 (ref 5–15)
BUN: 7 mg/dL — ABNORMAL LOW (ref 8–23)
CO2: 24 mmol/L (ref 22–32)
Calcium: 8.6 mg/dL — ABNORMAL LOW (ref 8.9–10.3)
Chloride: 101 mmol/L (ref 98–111)
Creatinine, Ser: 0.55 mg/dL — ABNORMAL LOW (ref 0.61–1.24)
GFR, Estimated: >60 mL/min (ref >60)
Glucose, Bld: 82 mg/dL (ref 70–99)
Potassium: 4.9 mmol/L (ref 3.5–5.1)
Sodium: 132 mmol/L — ABNORMAL LOW (ref 135–145)

## 2022-05-29 LAB — CBC
HCT: 27.2 % — ABNORMAL LOW (ref 39.0–52.0)
Hemoglobin: 9 g/dL — ABNORMAL LOW (ref 13.0–17.0)
MCH: 36.1 pg — ABNORMAL HIGH (ref 26.0–34.0)
MCHC: 33.1 g/dL (ref 30.0–36.0)
MCV: 109.2 fL — ABNORMAL HIGH (ref 80.0–100.0)
Platelets: 331 10*3/uL (ref 150–400)
RBC: 2.49 MIL/uL — ABNORMAL LOW (ref 4.22–5.81)
RDW: 16.1 % — ABNORMAL HIGH (ref 11.5–15.5)
WBC: 6.2 10*3/uL (ref 4.0–10.5)
nRBC: 0 % (ref 0.0–0.2)

## 2022-05-29 LAB — GLUCOSE, CAPILLARY: Glucose-Capillary: 85 mg/dL (ref 70–99)

## 2022-05-29 LAB — MAGNESIUM: Magnesium: 1.6 mg/dL — ABNORMAL LOW (ref 1.7–2.4)

## 2022-05-29 MED ORDER — MAGNESIUM SULFATE 2 GM/50ML IV SOLN
2.0000 g | Freq: Once | INTRAVENOUS | Status: AC
  Administered 2022-05-29: 2 g via INTRAVENOUS

## 2022-05-29 MED ORDER — MAGNESIUM SULFATE 2 GM/50ML IV SOLN
INTRAVENOUS | Status: AC
  Filled 2022-05-29: qty 50

## 2022-05-29 NOTE — Progress Notes (Signed)
Occupational Therapy Treatment Patient Details Name: Anthony [name] MRN: [id number] DOB: 02/12/1956 Today's Date: 05/29/2022   History of present illness Pt is 66 y/o M admitted to [facility] 05/19/22 with LE swelling, back pain, SOB, workup for sepsis. Of note, pt with 5 ED visits in past 12 months. PMH to include afib with RVR, CHF, chronic LBP, PEs, depression, delirium, alcohol abuse, homelessness.   OT comments  Patient received in recliner and agreeable to OT session. Patient instructed on hand placement to stand from recliner and min guard assist to stand and ambulate to bathroom and back to simulate toilet transfers. Patient able to stand at sink with min guard assist and perform grooming tasks with no UE support. Patient able to doff and donn socks seated in recliner with supervision. Patient is making good gains and could benefit from further OT services to address safety and independence with self care. Acute OT to continue to follow.    Recommendations for follow up therapy are one component of a multi-disciplinary discharge planning process, led by the attending physician.  Recommendations may be updated based on patient status, additional functional criteria and insurance authorization.    Follow Up Recommendations  Skilled nursing-short term rehab (<3 hours/day)    Assistance Recommended at Discharge Frequent or constant Supervision/Assistance  Patient can return home with the following  A lot of help with walking and/or transfers;A lot of help with bathing/dressing/bathroom;Assistance with cooking/housework;Direct supervision/assist for medications management;Direct supervision/assist for financial management;Assist for transportation;Help with stairs or ramp for entrance   Equipment Recommendations  None recommended by OT (defer)    Recommendations for Other Services      Precautions / Restrictions Precautions Precautions: Fall Precaution Comments: posey Restrictions Weight  Bearing Restrictions: No       Mobility Bed Mobility Overal bed mobility: Needs Assistance             General bed mobility comments: OOB in recliner upon entry    Transfers Overall transfer level: Needs assistance Equipment used: Rolling walker (2 wheels) Transfers: Sit to/from Stand, Bed to chair/wheelchair/BSC Sit to Stand: Min guard     Step pivot transfers: Min guard     General transfer comment: min guard for safety with cues for safety and hand placement     Balance Overall balance assessment: Needs assistance Sitting-balance support: Bilateral upper extremity supported, Feet supported, No upper extremity supported Sitting balance-Leahy Scale: Fair Sitting balance - Comments: up in recliner   Standing balance support: Single extremity supported, Bilateral upper extremity supported, No upper extremity supported, During functional activity Standing balance-Leahy Scale: Poor Standing balance comment: able to stand at sink and perform hand and face hygiene with use of sink for balance                           ADL either performed or assessed with clinical judgement   ADL Overall ADL's : Needs assistance/impaired     Grooming: Wash/dry hands;Wash/dry face;Min guard;Standing Grooming Details (indicate cue type and reason): at sink             Lower Body Dressing: Supervision/safety;Sitting/lateral leans Lower Body Dressing Details (indicate cue type and reason): changed socks seated in recliner Toilet Transfer: Rolling walker (2 wheels);Ambulation;Min guard Armed forces technical officer Details (indicate cue type and reason): simulated to recliner with cues for hand placement           General ADL Comments: cues for safety    Extremity/Trunk Assessment  Vision       Perception     Praxis      Cognition Arousal/Alertness: Awake/alert Behavior During Therapy: Flat affect Overall Cognitive Status: No family/caregiver present to  determine baseline cognitive functioning Area of Impairment: Attention, Memory, Following commands, Safety/judgement, Awareness, Problem solving                   Current Attention Level: Sustained Memory: Decreased short-term memory Following Commands: Follows one step commands inconsistently, Follows one step commands with increased time Safety/Judgement: Decreased awareness of safety, Decreased awareness of deficits Awareness: Emergent Problem Solving: Slow processing, Decreased initiation, Requires tactile cues, Requires verbal cues General Comments: Oriented to self and location. required cues for safety        Exercises      Shoulder Instructions       General Comments      Pertinent Vitals/ Pain       Pain Assessment Pain Assessment: Faces Faces Pain Scale: No hurt Pain Intervention(s): Monitored during session  Home Living                                          Prior Functioning/Environment              Frequency  Min 2X/week        Progress Toward Goals  OT Goals(current goals can now be found in the care plan section)  Progress towards OT goals: Progressing toward goals  Acute Rehab OT Goals Patient Stated Goal: get better OT Goal Formulation: With patient Time For Goal Achievement: 06/03/22 Potential to Achieve Goals: Good ADL Goals Pt Will Perform Grooming: with modified independence;standing Pt Will Perform Upper Body Dressing: with min guard assist;sitting Pt Will Perform Lower Body Dressing: with min guard assist;sitting/lateral leans;sit to/from stand Pt Will Transfer to Toilet: with supervision;regular height toilet;ambulating Additional ADL Goal #1: Pt will perform 2 step ADL task with min cues  Plan Discharge plan remains appropriate;Frequency remains appropriate    Co-evaluation                 AM-PAC OT "6 Clicks" Daily Activity     Outcome Measure   Help from another person eating meals?: A  Gregson Help from another person taking care of personal grooming?: A Worsley Help from another person toileting, which includes using toliet, bedpan, or urinal?: A Auriemma Help from another person bathing (including washing, rinsing, drying)?: A Imhoff Help from another person to put on and taking off regular upper body clothing?: A Punch Help from another person to put on and taking off regular lower body clothing?: A Calarco 6 Click Score: 18    End of Session Equipment Utilized During Treatment: Gait belt;Rolling walker (2 wheels)  OT Visit Diagnosis: Unsteadiness on feet (R26.81);Other abnormalities of gait and mobility (R26.89);Muscle weakness (generalized) (M62.81)   Activity Tolerance Patient tolerated treatment well   Patient Left in chair;with call bell/phone within reach;with chair alarm set;Other (comment) (Posey belt)   Nurse Communication Mobility status        Time: [phone] OT Time Calculation (min): 24 min  Charges: OT General Charges $OT Visit: 1 Visit OT Treatments $Self Care/Home Management : 23-37 mins  Lodema Hong, [place]  Office [phone]   Trixie Dredge 05/29/2022, 11:46 AM

## 2022-05-29 NOTE — Progress Notes (Signed)
Spoke to [name] with [employer] who confirmed she will meet with pt around noon today to screen for possible admission. SW will follow.  Wandra Feinstein, MSW, LCSW [phone] (coverage)

## 2022-05-29 NOTE — Progress Notes (Signed)
     Daily Progress Note Intern Pager: [phone]  Patient name: Anthony [name] Medical record number: [id number] Date of birth: 12-Jan-1956 Age: 66 y.o. Gender: male  Primary Care Provider: Jacelyn Grip, MD Consultants: GI, Cardiology  Code Status: Full code   Pt Overview and Major Events to Date:  10/30: admitted   Assessment and Plan:  Anthony [name] is a 66 y.o. male presenting with back pain, SOB, and BLE swelling.  Currently being treated for acute on chronic diastolic CHF, A-fib, alcohol withdrawal.  Pertinent PMH/PSH includes A-fib, HFpEF, hypertension, alcohol use disorder.       Alcohol use disorder, severe, dependence ([place]) Through withdrawal window. - TOC help with resources for maintaining sobriety  - Thiamine, folate, MVI  Acute on chronic diastolic CHF (congestive heart failure) (HCC) Mild trace LE edema on exam, no crackles on exam.  - consider restarting home lasix 20 mg BID - Continue home metoprolol 12.5 mg daily - Strict I/Os, daily weights  Atrial fibrillation with RVR (HCC) Rate controled.  - 200 mg BID x7 day, followed by 200 mg daily -Monitor electrolytes (Keep Mg>2 and K>4) -d/c anticoagulation at discharge  Hypomagnesemia Mag 1.6 - ordered 2g Mag IV  - recheck daily  Macrocytic anemia Hgb 8.6>7.4>8.0 - Iron 30, ferritin 88, Tsat 11 - IV iron repletion  - transfuse if Hgb <7   Poor social situation Currently unhoused. Likely contributing to lack of follow up with PCP and inability to obtain medications, leading to recurrent hospitalizations and chronic disease exacerbations. -TOC consult for dispo planning  - planning SNF placement for dispo   FEN/GI: Heart healthy, mechanically soft diet  PPx: Xarelto  Dispo:SNF  pending bed availability . Barriers include bed placement.   Subjective:  No acute events overnight.  Patient resting comfortably.  Awaiting SNF placement.  Had discussion about discontinuing anticoagulation.  Through shared  decision making determined by coagulant should be discontinued at discharge since patient is a high risk for falls and has the history of substance abuse.  Objective: Temp:  [98.4 F (36.9 C)-99.2 F (37.3 C)] 98.4 F (36.9 C) (11/10 0336) Pulse Rate:  [79-86] 79 (11/10 0336) Resp:  [15-19] 19 (11/10 0336) BP: (108-113)/(76-80) 108/76 (11/10 0336) SpO2:  [98 %-99 %] 98 % (11/10 0336) Weight:  [67.3 kg] 67.3 kg (11/10 0336) Physical Exam: General: Well-appearing, no acute distress Cardiovascular: Regular rate regular rhythm no murmurs on exam.  Patient still in sinus rhythm overnight. Respiratory: Clear no wheezing no increased work of breathing Abdomen: Soft, nontender, nondistended Extremities: No peripheral edema  Laboratory: Most recent CBC Lab Results  Component Value Date   WBC 6.2 05/29/2022   HGB 9.0 (L) 05/29/2022   HCT 27.2 (L) 05/29/2022   MCV 109.2 (H) 05/29/2022   PLT 331 05/29/2022   Most recent BMP    Latest Ref Rng & Units 05/29/2022    1:51 AM  BMP  Glucose 70 - 99 mg/dL 82   BUN 8 - 23 mg/dL 7   Creatinine 0.61 - 1.24 mg/dL 0.55   Sodium 135 - 145 mmol/L 132   Potassium 3.5 - 5.1 mmol/L 4.9   Chloride 98 - 111 mmol/L 101   CO2 22 - 32 mmol/L 24   Calcium 8.9 - 10.3 mg/dL 8.6     Darci Current, DO 05/29/2022, 9:07 AM  PGY-1, [place] Intern pager: [phone], text pages welcome Secure chat group [place]

## 2022-05-29 NOTE — Progress Notes (Signed)
Physical Therapy Treatment Patient Details Name: Anthony [name] MRN: [id number] DOB: 26-Jul-1955 Today's Date: 05/29/2022   History of Present Illness Pt is 66 y/o M admitted to [name]/19/22 with LE swelling, back pain, SOB, workup for sepsis. Of note, pt with 5 ED visits in past 12 months. PMH to include afib with RVR, CHF, chronic LBP, PEs, depression, delirium, alcohol abuse, homelessness.    PT Comments    Pt is demonstrating improvements in his cognition and balance. He was able to identify that he was not cognitively intact previously, but he still has deficits in attention, memory, and processing speed. Pt was able to ambulate slowly with a RW at a min guard assist level. However, he reports at baseline he uses a cane. When attempting to ambulate with x1 HHA to simulate using a cane, he displayed increased instability and needed minA to prevent LOB. Pt is at high risk for falls, supported by his 30 second sit to stand test results of 5 reps today. A score of < 12 is indicative of a risk for falls for males in his age group. Will continue to follow acutely. Current recommendations remain appropriate.     Recommendations for follow up therapy are one component of a multi-disciplinary discharge planning process, led by the attending physician.  Recommendations may be updated based on patient status, additional functional criteria and insurance authorization.  Follow Up Recommendations  Skilled nursing-short term rehab (<3 hours/day) Can patient physically be transported by private vehicle: Yes   Assistance Recommended at Discharge Intermittent Supervision/Assistance  Patient can return home with the following [name] help with walking and/or transfers;[name] help with bathing/dressing/bathroom;Assist for transportation;Help with stairs or ramp for entrance;Direct supervision/assist for medications management;Direct supervision/assist for financial management;Assistance with  cooking/housework   Equipment Recommendations  Rolling walker (2 wheels)    Recommendations for Other Services       Precautions / Restrictions Precautions Precautions: Fall Precaution Comments: posey Restrictions Weight Bearing Restrictions: No     Mobility  Bed Mobility               General bed mobility comments: Pt up in recliner upon arrival.    Transfers Overall transfer level: Needs assistance Equipment used: Rolling walker (2 wheels) Transfers: Sit to/from Stand Sit to Stand: Min guard           General transfer comment: Min guard assist for safety, slow to rise, but no LOB. x11 reps    Ambulation/Gait Ambulation/Gait assistance: Min assist, Min guard Gait Distance (Feet): 150 Feet Assistive device: Rolling walker (2 wheels), 1 person hand held assist Gait Pattern/deviations: Narrow base of support, Decreased stride length, Shuffle, Trunk flexed Gait velocity: decreased Gait velocity interpretation: <1.8 ft/sec, indicate of risk for recurrent falls   General Gait Details: Pt continues to push RW distal to him, but would correct when cued and maintain it for ~30 sec then need cues again. Min guard assist to ambulate with RW. However, minA for stability when trying to ambulate with 1 HHA. Pt reports using a cane at baseline.   Stairs             Wheelchair Mobility    Modified Rankin (Stroke Patients Only)       Balance Overall balance assessment: Needs assistance Sitting-balance support: Feet supported, No upper extremity supported Sitting balance-Leahy Scale: Good     Standing balance support: Bilateral upper extremity supported, During functional activity, Reliant on assistive device for balance, Single extremity supported Standing  balance-Leahy Scale: Poor Standing balance comment: Reliant on at least 1 UE support or assistance for standing mobility                            Cognition Arousal/Alertness:  Awake/alert Behavior During Therapy: WFL for tasks assessed/performed Overall Cognitive Status: No family/caregiver present to determine baseline cognitive functioning Area of Impairment: Attention, Memory, Following commands, Safety/judgement, Awareness, Problem solving                   Current Attention Level: Selective Memory: Decreased short-term memory Following Commands: Follows one step commands with increased time, Follows one step commands consistently Safety/Judgement: Decreased awareness of safety, Decreased awareness of deficits Awareness: Emergent Problem Solving: Slow processing, Requires verbal cues General Comments: Pt oriented to self, month, year, and location. Pt much more aware of his deficits, reporting he thought he was dreaming but in reality he was awake but not thinking clearly. Very slow to process thoughts. Needs redirecting at times.        Exercises Other Exercises Other Exercises: x10 sit <> stand    General Comments General comments (skin integrity, edema, etc.): VSS on RA; 30 second STS = 5 reps      Pertinent Vitals/Pain Pain Assessment Pain Assessment: Faces Faces Pain Scale: No hurt Pain Intervention(s): Monitored during session    Home Living                          Prior Function            PT Goals (current goals can now be found in the care plan section) Acute Rehab PT Goals Patient Stated Goal: to improve PT Goal Formulation: With patient Time For Goal Achievement: 06/03/22 Potential to Achieve Goals: Fair Progress towards PT goals: Progressing toward goals    Frequency    Min 3X/week      PT Plan Current plan remains appropriate    Co-evaluation              AM-PAC PT "6 Clicks" Mobility   Outcome Measure  Help needed turning from your back to your side while in a flat bed without using bedrails?: None Help needed moving from lying on your back to sitting on the side of a flat bed without  using bedrails?: None Help needed moving to and from a bed to a chair (including a wheelchair)?: [name] Help needed standing up from a chair using your arms (e.g., wheelchair or bedside chair)?: [name] Help needed to walk in hospital room?: [name] Help needed climbing 3-5 steps with a railing? : A Lot 6 Click Score: 19    End of Session   Activity Tolerance: Patient tolerated treatment well Patient left: with call bell/phone within reach;in chair;Other (comment) (posey chair belt placed around pt and plugged into monitor with monitor on) Nurse Communication: Mobility status;Other (comment) (red swollen leg noted on L) PT Visit Diagnosis: Other abnormalities of gait and mobility (R26.89);Unsteadiness on feet (R26.81)     Time: 3267-1245 PT Time Calculation (min) (ACUTE ONLY): 25 min  Charges:  $Gait Training: 8-22 mins $Therapeutic Exercise: 8-22 mins                     Moishe Spice, PT, DPT Acute Rehabilitation Services  Office: [phone]    Anthony [name] 05/29/2022, 4:18 PM

## 2022-05-30 DIAGNOSIS — R410 Disorientation, unspecified: Secondary | ICD-10-CM | POA: Diagnosis not present

## 2022-05-30 LAB — BASIC METABOLIC PANEL
Anion gap: 6 (ref 5–15)
BUN: 5 mg/dL — ABNORMAL LOW (ref 8–23)
CO2: 25 mmol/L (ref 22–32)
Calcium: 8.6 mg/dL — ABNORMAL LOW (ref 8.9–10.3)
Chloride: 103 mmol/L (ref 98–111)
Creatinine, Ser: 0.68 mg/dL (ref 0.61–1.24)
GFR, Estimated: >60 mL/min (ref >60)
Glucose, Bld: 126 mg/dL — ABNORMAL HIGH (ref 70–99)
Potassium: 4.2 mmol/L (ref 3.5–5.1)
Sodium: 134 mmol/L — ABNORMAL LOW (ref 135–145)

## 2022-05-30 LAB — CBC
HCT: 26 % — ABNORMAL LOW (ref 39.0–52.0)
Hemoglobin: 8.7 g/dL — ABNORMAL LOW (ref 13.0–17.0)
MCH: 36.6 pg — ABNORMAL HIGH (ref 26.0–34.0)
MCHC: 33.5 g/dL (ref 30.0–36.0)
MCV: 109.2 fL — ABNORMAL HIGH (ref 80.0–100.0)
Platelets: 357 10*3/uL (ref 150–400)
RBC: 2.38 MIL/uL — ABNORMAL LOW (ref 4.22–5.81)
RDW: 16.1 % — ABNORMAL HIGH (ref 11.5–15.5)
WBC: 4.9 10*3/uL (ref 4.0–10.5)
nRBC: 0 % (ref 0.0–0.2)

## 2022-05-30 LAB — MAGNESIUM: Magnesium: 1.4 mg/dL — ABNORMAL LOW (ref 1.7–2.4)

## 2022-05-30 MED ORDER — MAGNESIUM SULFATE 2 GM/50ML IV SOLN
2.0000 g | Freq: Once | INTRAVENOUS | Status: AC
  Administered 2022-05-30: 2 g via INTRAVENOUS
  Filled 2022-05-30: qty 50

## 2022-05-30 MED ORDER — ACETAMINOPHEN 325 MG PO TABS
650.0000 mg | ORAL_TABLET | Freq: Four times a day (QID) | ORAL | Status: DC
  Administered 2022-05-30 – 2022-06-02 (×12): 650 mg via ORAL
  Filled 2022-05-30 (×12): qty 2

## 2022-05-30 MED ORDER — FERROUS SULFATE 325 (65 FE) MG PO TABS
325.0000 mg | ORAL_TABLET | Freq: Every day | ORAL | Status: DC
  Administered 2022-05-30 – 2022-06-02 (×4): 325 mg via ORAL
  Filled 2022-05-30 (×4): qty 1

## 2022-05-30 MED ORDER — MAGNESIUM CHLORIDE 64 MG PO TBEC: 1.0000 | DELAYED_RELEASE_TABLET | Freq: Two times a day (BID) | ORAL | Status: DC

## 2022-05-30 MED ORDER — MAGNESIUM CHLORIDE 64 MG PO TBEC
1.0000 | DELAYED_RELEASE_TABLET | Freq: Two times a day (BID) | ORAL | Status: DC
  Administered 2022-05-30 – 2022-06-02 (×7): 64 mg via ORAL
  Filled 2022-05-30 (×8): qty 1

## 2022-05-30 MED ORDER — PANTOPRAZOLE SODIUM 40 MG PO TBEC
40.0000 mg | DELAYED_RELEASE_TABLET | Freq: Every day | ORAL | Status: DC
  Administered 2022-05-30 – 2022-06-02 (×4): 40 mg via ORAL
  Filled 2022-05-30 (×4): qty 1

## 2022-05-30 MED ORDER — MAGNESIUM SULFATE IN D5W 1-5 GM/100ML-% IV SOLN
1.0000 g | Freq: Once | INTRAVENOUS | Status: AC
  Administered 2022-05-30: 1 g via INTRAVENOUS
  Filled 2022-05-30: qty 100

## 2022-05-30 MED ORDER — METHOCARBAMOL 500 MG PO TABS
750.0000 mg | ORAL_TABLET | Freq: Three times a day (TID) | ORAL | Status: DC
  Administered 2022-05-30 – 2022-06-02 (×9): 750 mg via ORAL
  Filled 2022-05-30 (×9): qty 2

## 2022-05-30 NOTE — Progress Notes (Signed)
Mobility Specialist - Progress Note   05/30/22 1039  Mobility  Activity Transferred to/from [facility]  Level of Assistance Standby assist, set-up cues, supervision of patient - no hands on  Assistive Device None  Distance Ambulated (ft) 2 ft  Activity Response Tolerated well  Mobility Referral Yes  $Mobility charge 1 Mobility   Pt received in bed and requesting to use BSC. Pt was stand by assist for transfer and ModI for pericare. Pt declined further ambulation d/t wanting to eat breakfast. Pt was returned to bed with all needs met.   Franki Monte  Mobility Specialist Please contact via S[facility] or Rehab office at [phone]

## 2022-05-30 NOTE — Progress Notes (Signed)
     Daily Progress Note Intern Pager: [phone]  Patient name: Caiden Arteaga [place] Medical record number: [id number] Date of birth: 1956/04/09 Age: 66 y.o. Gender: male  Primary Care Provider: Jacelyn Grip, MD Consultants: GI, Cardiology  Code Status: Full code    Pt Overview and Major Events to Date:  10/30: admitted    Assessment and Plan: Braydin A Peragine is a 66 y.o. male admitted for acute on chronic diastolic CHF, A-fib, alcohol withdrawal. Now medically stable for discharge, pending SNF placement. Pertinent PMH/PSH includes A-fib, HFpEF, hypertension, alcohol use disorder.       Alcohol use disorder, severe, dependence ([place]) Through withdrawal window. - TOC help with resources for maintaining sobriety  - Thiamine, folate, MVI  Acute on chronic diastolic CHF (congestive heart failure) (HCC) Euvolemic on exam today. - consider restarting home lasix 20 mg BID if signs of fluid overload - Continue home metoprolol 12.5 mg daily - Strict I/Os, daily weights  Atrial fibrillation with RVR (HCC) Rate controled.  - Amio 200 mg BID x7 day (11/7-11/14), followed by 200 mg daily - Monitor electrolytes (Keep Mg>2 and K>4) - d/c anticoagulation at discharge  Hypomagnesemia - Repleted 2g Mg - recheck daily  Macrocytic anemia Hgb stably low. Component of IDA. -Start daily iron supplement - transfuse if Hgb <7   Poor social situation Currently unhoused. Likely contributing to lack of follow up with PCP and inability to obtain medications, leading to recurrent hospitalizations and chronic disease exacerbations. -TOC consult for dispo planning  - planning SNF placement for dispo   FEN/GI:  Heart healthy, mechanically soft diet   PPx: Xarelto   Dispo: SNF pending bed availability . Barriers include bed placement.   Subjective:  NAEO. Resting comfortably.  Objective: Temp:  [97.8 F (36.6 C)-100 F (37.8 C)] 100 F (37.8 C) (11/11 0423) Pulse Rate:  [77-98] 98 (11/11 0423) Resp:   [17-19] 18 (11/11 0423) BP: (84-126)/(58-96) 95/58 (11/11 0423) SpO2:  [97 %-100 %] 97 % (11/11 0423) Weight:  [24.8 kg] 24.8 kg (11/11 0423)  Physical Exam: General: Resting comfortably on RA. Laying in bed. NAD. Cardiovascular: RRR. No lower ext edema. Respiratory: CTAB. Normal WOB on RA. Abdomen: Soft, nontender,nondistended. Normal BS.  Extremities: Indurated erythmatous and warm rash on L shin (previously noted on admission)  Laboratory: Most recent CBC Lab Results  Component Value Date   WBC 4.9 05/30/2022   HGB 8.7 (L) 05/30/2022   HCT 26.0 (L) 05/30/2022   MCV 109.2 (H) 05/30/2022   PLT 357 05/30/2022   Most recent BMP    Latest Ref Rng & Units 05/30/2022    1:00 AM  BMP  Glucose 70 - 99 mg/dL 126   BUN 8 - 23 mg/dL 5   Creatinine 0.61 - 1.24 mg/dL 0.68   Sodium 135 - 145 mmol/L 134   Potassium 3.5 - 5.1 mmol/L 4.2   Chloride 98 - 111 mmol/L 103   CO2 22 - 32 mmol/L 25   Calcium 8.9 - 10.3 mg/dL 8.6     Arlyce Dice, MD 05/30/2022, 7:42 AM  PGY-1, [place] Intern pager: [phone], text pages welcome Secure chat group [place]

## 2022-05-31 DIAGNOSIS — I509 Heart failure, unspecified: Secondary | ICD-10-CM | POA: Diagnosis not present

## 2022-05-31 DIAGNOSIS — I480 Paroxysmal atrial fibrillation: Secondary | ICD-10-CM | POA: Diagnosis not present

## 2022-05-31 DIAGNOSIS — Z91148 Patient's other noncompliance with medication regimen for other reason: Secondary | ICD-10-CM

## 2022-05-31 DIAGNOSIS — R410 Disorientation, unspecified: Secondary | ICD-10-CM | POA: Diagnosis not present

## 2022-05-31 LAB — BASIC METABOLIC PANEL
Anion gap: 8 (ref 5–15)
BUN: 9 mg/dL (ref 8–23)
CO2: 22 mmol/L (ref 22–32)
Calcium: 8.5 mg/dL — ABNORMAL LOW (ref 8.9–10.3)
Chloride: 100 mmol/L (ref 98–111)
Creatinine, Ser: 0.62 mg/dL (ref 0.61–1.24)
GFR, Estimated: >60 mL/min (ref >60)
Glucose, Bld: 83 mg/dL (ref 70–99)
Potassium: 4.4 mmol/L (ref 3.5–5.1)
Sodium: 130 mmol/L — ABNORMAL LOW (ref 135–145)

## 2022-05-31 LAB — MAGNESIUM: Magnesium: 1.3 mg/dL — ABNORMAL LOW (ref 1.7–2.4)

## 2022-05-31 LAB — CBC
HCT: 24.9 % — ABNORMAL LOW (ref 39.0–52.0)
Hemoglobin: 8.3 g/dL — ABNORMAL LOW (ref 13.0–17.0)
MCH: 36.1 pg — ABNORMAL HIGH (ref 26.0–34.0)
MCHC: 33.3 g/dL (ref 30.0–36.0)
MCV: 108.3 fL — ABNORMAL HIGH (ref 80.0–100.0)
Platelets: 357 10*3/uL (ref 150–400)
RBC: 2.3 MIL/uL — ABNORMAL LOW (ref 4.22–5.81)
RDW: 16 % — ABNORMAL HIGH (ref 11.5–15.5)
WBC: 6.8 10*3/uL (ref 4.0–10.5)
nRBC: 0 % (ref 0.0–0.2)

## 2022-05-31 MED ORDER — LORATADINE 10 MG PO TABS
10.0000 mg | ORAL_TABLET | Freq: Every day | ORAL | Status: DC
  Administered 2022-06-01 – 2022-06-02 (×3): 10 mg via ORAL
  Filled 2022-05-31 (×3): qty 1

## 2022-05-31 MED ORDER — MAGNESIUM SULFATE 2 GM/50ML IV SOLN
2.0000 g | Freq: Once | INTRAVENOUS | Status: AC
  Administered 2022-05-31: 2 g via INTRAVENOUS
  Filled 2022-05-31: qty 50

## 2022-05-31 NOTE — Progress Notes (Signed)
Pt requesting something for generalized itching. Provider on call paged via [place].

## 2022-05-31 NOTE — Progress Notes (Signed)
Mobility Specialist - Progress Note   05/31/22 1130  Mobility  Activity Ambulated with assistance in hallway  Level of Assistance Contact guard assist, steadying assist  Assistive Device Front wheel walker  Distance Ambulated (ft) 300 ft  Activity Response Tolerated well  Mobility Referral Yes  $Mobility charge 1 Mobility   Pt received bed and agreeable. No complaints throughout. Pt returned to bed with all needs met and bed alarm on.   Franki Monte  Mobility Specialist Please contact via S[facility]itor or Rehab office at [phone]

## 2022-05-31 NOTE — Progress Notes (Signed)
Physical Therapy Treatment Patient Details Name: Anthony [name] MRN: [id number] DOB: 1955/09/25 Today's Date: 05/31/2022   History of Present Illness Pt is 66 y/o M admitted to [facility] 05/19/22 with LE swelling, back pain, SOB, workup for sepsis. Of note, pt with 5 ED visits in past 12 months. PMH to include afib with RVR, CHF, chronic LBP, PEs, depression, delirium, alcohol abuse, homelessness.    PT Comments    Pt making steady progress with mobility. Did well with rollator giving him more stability with gait. Continue to recommend SNF at DC.    Recommendations for follow up therapy are one component of a multi-disciplinary discharge planning process, led by the attending physician.  Recommendations may be updated based on patient status, additional functional criteria and insurance authorization.  Follow Up Recommendations  Skilled nursing-short term rehab (<3 hours/day) Can patient physically be transported by private vehicle: Yes   Assistance Recommended at Discharge Intermittent Supervision/Assistance  Patient can return home with the following A Seng help with walking and/or transfers;A Weisel help with bathing/dressing/bathroom;Assist for transportation;Help with stairs or ramp for entrance;Direct supervision/assist for medications management;Direct supervision/assist for financial management;Assistance with Landscape architect (4 wheels)    Recommendations for Other Services       Precautions / Restrictions Precautions Precautions: Fall Restrictions Weight Bearing Restrictions: No     Mobility  Bed Mobility Overal bed mobility: Modified Independent Bed Mobility: Supine to Sit, Sit to Supine     Supine to sit: Modified independent (Device/Increase time) Sit to supine: Modified independent (Device/Increase time)        Transfers Overall transfer level: Needs assistance Equipment used: None Transfers: Sit to/from Stand Sit to  Stand: Min guard           General transfer comment: Assist for safety and lines    Ambulation/Gait Ambulation/Gait assistance: Min guard, Min assist Gait Distance (Feet): 200 Feet Assistive device: Rollator (4 wheels), None Gait Pattern/deviations: Narrow base of support, Decreased stride length, Shuffle, Trunk flexed Gait velocity: decreased Gait velocity interpretation: 1.31 - 2.62 ft/sec, indicative of limited community ambulator   General Gait Details: min assist for balance without assistive device. Min guard when using rollator.   Stairs             Wheelchair Mobility    Modified Rankin (Stroke Patients Only)       Balance Overall balance assessment: Needs assistance Sitting-balance support: Feet supported, No upper extremity supported Sitting balance-Leahy Scale: Good     Standing balance support: During functional activity, No upper extremity supported Standing balance-Leahy Scale: Fair                              Cognition Arousal/Alertness: Awake/alert Behavior During Therapy: WFL for tasks assessed/performed Overall Cognitive Status: No family/caregiver present to determine baseline cognitive functioning Area of Impairment: Attention, Memory, Following commands, Safety/judgement, Awareness, Problem solving                   Current Attention Level: Alternating Memory: Decreased short-term memory Following Commands: Follows one step commands consistently Safety/Judgement: Decreased awareness of safety, Decreased awareness of deficits Awareness: Emergent Problem Solving: Requires verbal cues          Exercises      General Comments        Pertinent Vitals/Pain Pain Assessment Pain Assessment: No/denies pain    Home Living  Prior Function            PT Goals (current goals can now be found in the care plan section) Acute Rehab PT Goals Patient Stated Goal: to  improve Progress towards PT goals: Progressing toward goals    Frequency    Min 3X/week      PT Plan Current plan remains appropriate    Co-evaluation              AM-PAC PT "6 Clicks" Mobility   Outcome Measure  Help needed turning from your back to your side while in a flat bed without using bedrails?: None Help needed moving from lying on your back to sitting on the side of a flat bed without using bedrails?: None Help needed moving to and from a bed to a chair (including a wheelchair)?: A Hellenbrand Help needed standing up from a chair using your arms (e.g., wheelchair or bedside chair)?: A Viar Help needed to walk in hospital room?: A Coatney Help needed climbing 3-5 steps with a railing? : A Lot 6 Click Score: 19    End of Session   Activity Tolerance: Patient tolerated treatment well Patient left: with call bell/phone within reach;in bed;with bed alarm set   PT Visit Diagnosis: Other abnormalities of gait and mobility (R26.89);Unsteadiness on feet (R26.81)     Time: 6629-4765 PT Time Calculation (min) (ACUTE ONLY): 19 min  Charges:  $Gait Training: 8-22 mins                     [place] Office [place] 05/31/2022, 4:16 PM

## 2022-05-31 NOTE — Progress Notes (Signed)
     Daily Progress Note Intern Pager: [phone]  Patient name: Anthony [name] Medical record number: [id number] Date of birth: [birth year] Age: 66 y.o. Gender: male  Primary Care Provider: Jacelyn Grip, MD Consultants: GI, Cardiology Code Status: Full  Pt Overview and Major Events to Date:  10/30: admitted     Assessment and Plan:  [name] is a 66 y.o. male admitted for acute on chronic diastolic CHF, A-fib, alcohol withdrawal. Now medically stable for discharge, pending SNF placement. Pertinent PMH/PSH includes A-fib, HFpEF, hypertension, alcohol use disorder.      Alcohol use disorder, severe, dependence ([place]) Is outside of withdrawal window. - TOC help with resources for maintaining sobriety  - Thiamine, folate, MVI  Acute on chronic diastolic CHF (congestive heart failure) (HCC) Euvolemic on exam today. 1775 ml UOP - consider restarting home lasix 20 mg BID if signs of fluid overload - Continue home metoprolol 12.5 mg daily - Strict I/Os, daily weights  Atrial fibrillation with RVR (HCC) Rate controled.  - Amio 200 mg BID x7 day (11/7-11/14), followed by 200 mg daily - Monitor electrolytes (Keep Mg>2 and K>4) - d/c anticoagulation at discharge  Hypomagnesemia - Repleted 2g Mg - recheck daily  Macrocytic anemia Hgb stably low at 8.3 today. Component of IDA. -Start daily iron supplement - transfuse if Hgb <7   Poor social situation Currently unhoused. Likely contributing to lack of follow up with PCP and inability to obtain medications, leading to recurrent hospitalizations and chronic disease exacerbations. -TOC consult for dispo planning  - planning SNF placement for dispo    FEN/GI: Heart healthy, mechanically soft diet    PPx: Xarelto Dispo:SNF  pending bed availability . Barriers include bed placement.   Subjective:  Doing well this morning, notes that he'll want to see his PCP for issues with cataracts, and teeth, when he get's  out.  Objective: Temp:  [97.3 F (36.3 C)-98.3 F (36.8 C)] 97.3 F (36.3 C) (11/12 0518) Pulse Rate:  [78-92] 92 (11/11 2236) Resp:  [14-20] 17 (11/12 0518) BP: (107-118)/(61-88) 117/79 (11/12 0518) SpO2:  [97 %-99 %] 98 % (11/12 0518) Weight:  [65.5 kg] 65.5 kg (11/11 1300) Physical Exam: General: Well appearing, NAD, polite, conversant/interactive, white male Cardiovascular: RRR, NRMG, S1/S2 Respiratory: CTABL, no wheezes or stridor, good WOB Abdomen: Soft, NTTP, non-distended Extremities: Moving all extremities independently, no edema in extremities  Laboratory: Most recent CBC Lab Results  Component Value Date   WBC 6.8 05/31/2022   HGB 8.3 (L) 05/31/2022   HCT 24.9 (L) 05/31/2022   MCV 108.3 (H) 05/31/2022   PLT 357 05/31/2022   Most recent BMP    Latest Ref Rng & Units 05/31/2022   12:59 AM  BMP  Glucose 70 - 99 mg/dL 83   BUN 8 - 23 mg/dL 9   Creatinine 0.61 - 1.24 mg/dL 0.62   Sodium 135 - 145 mmol/L 130   Potassium 3.5 - 5.1 mmol/L 4.4   Chloride 98 - 111 mmol/L 100   CO2 22 - 32 mmol/L 22   Calcium 8.9 - 10.3 mg/dL 8.5     Holley Bouche, MD 05/31/2022, 7:16 AM  PGY-2, [place] Intern pager: [phone], text pages welcome Secure chat group [place]

## 2022-06-01 DIAGNOSIS — E871 Hypo-osmolality and hyponatremia: Secondary | ICD-10-CM | POA: Insufficient documentation

## 2022-06-01 DIAGNOSIS — R410 Disorientation, unspecified: Secondary | ICD-10-CM | POA: Diagnosis not present

## 2022-06-01 LAB — CBC
HCT: 25.3 % — ABNORMAL LOW (ref 39.0–52.0)
Hemoglobin: 8.4 g/dL — ABNORMAL LOW (ref 13.0–17.0)
MCH: 36.1 pg — ABNORMAL HIGH (ref 26.0–34.0)
MCHC: 33.2 g/dL (ref 30.0–36.0)
MCV: 108.6 fL — ABNORMAL HIGH (ref 80.0–100.0)
Platelets: 388 10*3/uL (ref 150–400)
RBC: 2.33 MIL/uL — ABNORMAL LOW (ref 4.22–5.81)
RDW: 16.1 % — ABNORMAL HIGH (ref 11.5–15.5)
WBC: 4.7 10*3/uL (ref 4.0–10.5)
nRBC: 0 % (ref 0.0–0.2)

## 2022-06-01 LAB — BASIC METABOLIC PANEL
Anion gap: 8 (ref 5–15)
BUN: 11 mg/dL (ref 8–23)
CO2: 22 mmol/L (ref 22–32)
Calcium: 8.5 mg/dL — ABNORMAL LOW (ref 8.9–10.3)
Chloride: 99 mmol/L (ref 98–111)
Creatinine, Ser: 0.61 mg/dL (ref 0.61–1.24)
GFR, Estimated: >60 mL/min (ref >60)
Glucose, Bld: 69 mg/dL — ABNORMAL LOW (ref 70–99)
Potassium: 4.1 mmol/L (ref 3.5–5.1)
Sodium: 129 mmol/L — ABNORMAL LOW (ref 135–145)

## 2022-06-01 LAB — MAGNESIUM: Magnesium: 1.4 mg/dL — ABNORMAL LOW (ref 1.7–2.4)

## 2022-06-01 MED ORDER — MAGNESIUM SULFATE 2 GM/50ML IV SOLN
2.0000 g | Freq: Once | INTRAVENOUS | Status: AC
  Administered 2022-06-01: 2 g via INTRAVENOUS
  Filled 2022-06-01: qty 50

## 2022-06-01 NOTE — NC FL2 (Signed)
[place] LEVEL OF CARE SCREENING TOOL     IDENTIFICATION  Patient Name: Anthony [name] Birthdate: [birth year] Sex: male Admission Date (Current Location): 05/19/2022  [facility] and [place] Number:  Herbalist and Address:  [facility], [place] [address] [place], [place] [address]      Provider Number: [id number]  Attending Physician Name and Address:  Lind Covert, MD  Relative Name and Phone Number:  Galvin, Aversa (Brother)  [phone] (Home Phone)    Current Level of Care: Hospital Recommended Level of Care: [place] Prior Approval Number:    Date Approved/Denied:   PASRR Number: [id number] E, expires 06/25/22  Discharge Plan: SNF    Current Diagnoses: Patient Active Problem List   Diagnosis Date Noted   Non compliance w medication regimen 05/31/2022   Delirium 05/25/2022   Stricture and stenosis of esophagus 05/21/2022   Gastritis and gastroduodenitis 05/21/2022   Loose stools 05/20/2022   Malnutrition of moderate degree 05/20/2022   Acute on chronic congestive heart failure (HCC)    Leukocytosis 05/19/2022   Sepsis ([place]) 05/19/2022   Cellulitis of left leg, possible 05/19/2022   Moniliasis, interdigital 05/19/2022   Open toe wound, right foot, medial fourth digit 05/19/2022   Malnutrition ([place]) 05/19/2022   Alcohol abuse 03/24/2022   Dyslipidemia 03/24/2022   Acute on chronic diastolic CHF (congestive heart failure) ([place]) 03/24/2022   Acute respiratory failure with hypoxia (HCC) 03/23/2022   Generalized weakness    Atrial fibrillation with RVR ([place]) 03/15/2022   Hypokalemia 03/15/2022   Iron deficiency anemia 10/16/2020   Cough    Hypomagnesemia    Protein-calorie malnutrition, severe 08/22/2020   Alcohol use disorder, severe, in controlled environment ([place])    Pressure injury of skin 08/18/2020   Urinary tract infection without hematuria    Severe sepsis ([place]) 08/14/2020   Paroxysmal  atrial fibrillation ([place]) 08/13/2020   Rhabdomyolysis 07/22/2020   Cerebral ventriculomegaly 07/22/2020   Hemoglobin decreased 07/22/2020   History of delirium tremons 07/22/2020   Hypoalbuminemia due to protein-calorie malnutrition ([place]) 07/22/2020   Tobacco use disorder 07/22/2020   Alcohol withdrawal delirium ([place])    Dehydration    Alcohol abuse with alcohol-induced mood disorder ([place]) 07/18/2018   Depression 05/26/2018   Anxiety state 04/25/2018   Need for immunization against influenza 04/25/2018   Alcohol withdrawal ([place]) 09/01/2017   DOE (dyspnea on exertion) 11/27/2016   Actinic keratosis 11/27/2016   Macrocytic anemia 11/23/2016   History of pulmonary embolus (PE) 11/02/2016   Hiatal hernia 09/14/2016   Skin cancer 08/13/2016   Poor social situation 06/09/2013   Tinea versicolor 06/08/2013   Back pain 05/07/2013   Seborrheic dermatitis of scalp 11/08/2012   HTN (hypertension) 04/11/2012   Alcohol use disorder, severe, dependence ([place]) 09/16/2006    Orientation RESPIRATION BLADDER Height & Weight     Self, Time, Situation, Place  Normal External catheter Weight: 145 lb 15.1 oz (66.2 kg) Height:  '5\' 10"'$  (177.8 cm)  BEHAVIORAL SYMPTOMS/MOOD NEUROLOGICAL BOWEL NUTRITION STATUS      Incontinent Diet (see d/c summary)  AMBULATORY STATUS COMMUNICATION OF NEEDS Skin   Limited Assist Verbally  (appropriate for ethnicity,dry,ecchymosis,abdomen,L,Erythema,buttocks,bilateral,rash,buttocks,bilateral,wound/Incision LDAs)                       Personal Care Assistance Level of Assistance  Bathing, Feeding, Dressing Bathing Assistance: Limited assistance Feeding assistance: Independent Dressing Assistance: Limited assistance     Functional Limitations Info  Sight,  Hearing, Speech Sight Info: Impaired Hearing Info: Adequate Speech Info: Adequate    SPECIAL CARE FACTORS FREQUENCY  PT (By licensed PT), OT (By licensed OT)     PT Frequency: 5x/week OT Frequency:  5x/week            Contractures Contractures Info: Not present    Additional Factors Info  Code Status, Allergies Code Status Info: full Allergies Info: Other-Seasonal allergies- Itchy eyes, runny nose, congestion,Poison Ivy Extract           Current Medications (06/01/2022):  This is the current hospital active medication list Current Facility-Administered Medications  Medication Dose Route Frequency Provider Last Rate Last Admin   acetaminophen (TYLENOL) tablet 650 mg  650 mg Oral Q6H Ganta, Anupa, DO   650 mg at 06/01/22 0350   amiodarone (PACERONE) tablet 200 mg  200 mg Oral BID Darci Current, DO   200 mg at 05/31/22 2251   clotrimazole (LOTRIMIN) 1 % cream   Topical BID McDiarmid, Blane Ohara, MD   Given at 05/31/22 2251   diclofenac Sodium (VOLTAREN) 1 % topical gel 2 g  2 g Topical BID PRN Arlyce Dice, MD   2 g at 05/30/22 2248   feeding supplement (ENSURE ENLIVE / ENSURE PLUS) liquid 237 mL  237 mL Oral TID BM McDiarmid, Blane Ohara, MD   237 mL at 05/31/22 2258   ferrous sulfate tablet 325 mg  325 mg Oral Q breakfast Lilland, Alana, DO   325 mg at 80/99/83 3825   folic acid (FOLVITE) tablet 1 mg  1 mg Oral Daily [name], MD   1 mg at 05/31/22 0539   Gerhardt's butt cream   Topical PRN Leeanne Rio, MD   Given at 05/26/22 0452   loratadine (CLARITIN) tablet 10 mg  10 mg Oral Daily Darci Current, DO   10 mg at 06/01/22 0039   magnesium chloride (SLOW-MAG) 64 MG SR tablet 64 mg  1 tablet Oral BID Ganta, Anupa, DO   64 mg at 05/31/22 2251   methocarbamol (ROBAXIN) tablet 750 mg  750 mg Oral Q8H Ganta, Anupa, DO   750 mg at 06/01/22 7673   metoprolol succinate (TOPROL-XL) 24 hr tablet 12.5 mg  12.5 mg Oral Daily [name], MD   12.5 mg at 05/31/22 0855   multivitamin with minerals tablet 1 tablet  1 tablet Oral Daily [name], MD   1 tablet at 05/31/22 0854   nystatin (MYCOSTATIN/NYSTOP) topical powder   Topical PRN Alesia Morin, MD       pantoprazole  (PROTONIX) EC tablet 40 mg  40 mg Oral Daily Lilland, Alana, DO   40 mg at 05/31/22 0855   rivaroxaban (XARELTO) tablet 20 mg  20 mg Oral Q supper Coralyn Pear B, MD   20 mg at 05/31/22 1622   rosuvastatin (CRESTOR) tablet 10 mg  10 mg Oral Daily [name], MD   10 mg at 05/31/22 0856   sucralfate (CARAFATE) 1 GM/10ML suspension 1 g  1 g Oral TID WC & HS Willia Craze, NP   1 g at 06/01/22 0755   thiamine (VITAMIN B1) tablet 100 mg  100 mg Oral Daily [name], MD   100 mg at 05/31/22 4193     Discharge Medications: Please see discharge summary for a list of discharge medications.  Relevant Imaging Results:  Relevant Lab Results:   Additional Information [phone]  Bethann Berkshire, LCSW

## 2022-06-01 NOTE — TOC Progression Note (Signed)
Transition of Care [facility]) - Progression Note    Patient Details  Name: Anthony [name] MRN: [id number] Date of Birth: 04/29/1956  Transition of Care [facility]) CM/SW [place], [place] Phone Number: 06/01/2022, 11:14 AM  Clinical Narrative:     CSW contacted [facility]; confirmed they can accept pt once Josem Kaufmann is approved. CSW submitted SNF auth request in [employer] portal; Status: Pending  Expected Discharge Plan: [place] Barriers to Discharge: Continued Medical Work up  Expected Discharge Plan and Services Expected Discharge Plan: [place] In-house Referral: Clinical Social Work     Living arrangements for the past 2 months:  (homeless)                                       Social Determinants of Health (SDOH) Interventions Food Insecurity Interventions: Other (Comment) (Patient provided food resources) Transportation Interventions:  (Patient will need bus pass at dc)  Readmission Risk Interventions    10/08/2020    2:48 PM  Readmission Risk Prevention Plan  Transportation Screening Complete  PCP or Specialist Appt within 3-5 Days Complete  HRI or [place] Complete  Social Work Consult for [place] Planning/Counseling Complete  Palliative Care Screening Complete  Medication Review Press photographer) Complete

## 2022-06-01 NOTE — Assessment & Plan Note (Signed)
Has worsening hyponatremia in the past few days. Per nursing, pt is drinking a lot of decaf coffee. Concern that this is in the setting of excessive fluid intake.  - Start 1500 mL/day fluid restriction

## 2022-06-01 NOTE — Progress Notes (Signed)
Orders received for IV Mg infusion. Attempted to flush NSL and site infiltrated and painful. IV team notified for new site. Jessie Foot, RN

## 2022-06-01 NOTE — Progress Notes (Signed)
Mobility Specialist - Progress Note   06/01/22 1507  Mobility  Activity Ambulated with assistance to bathroom  Level of Assistance Contact guard assist, steadying assist  Assistive Device None  Distance Ambulated (ft) 30 ft  Activity Response Tolerated well  Mobility Referral Yes  $Mobility charge 1 Mobility   Pt received in bed and requesting to use BR. No complaints throughout. Pt returned to bed with all needs met and bed alarm on.   Franki Monte  Mobility Specialist Please contact via Solicitor or Rehab office at [phone]

## 2022-06-01 NOTE — Progress Notes (Signed)
Daily Progress Note Intern Pager: [phone]  Patient name: Anthony [name] Medical record number: [id number] Date of birth: October 17, 1955 Age: 66 y.o. Gender: male  Primary Care Provider: Jacelyn Grip, MD Consultants: GI, Cardiology Code Status: Full code  Pt Overview and Major Events to Date:  10/30: admitted     Assessment and Plan: Anthony [name] is a 66 y.o. male admitted for acute on chronic diastolic CHF, A-fib, alcohol withdrawal. Now medically stable for discharge, pending SNF placement.   Pertinent PMH/PSH includes A-fib, HFpEF, hypertension, alcohol use disorder.       Alcohol use disorder, severe, dependence ([place]) Has hx of hallucinations noted at prior hospitalizations. 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 Otherwise appeared euthymic and no acute distress.  - Consider psych consult if symptoms or hallucinations worsen - TOC help with resources for maintaining sobriety  - Thiamine, folate, MVI  Acute on chronic diastolic CHF (congestive heart failure) (HCC) Euvolemic on exam today - consider restarting home lasix 20 mg BID if signs of fluid overload - Continue home metoprolol 12.5 mg daily - Strict I/Os, daily weights  Atrial fibrillation with RVR (HCC) Rate controled.  - Amio 200 mg BID x7 day (11/7-11/14), followed by 200 mg daily - Monitor electrolytes (Keep Mg>2 and K>4) - d/c anticoagulation at discharge  Hypomagnesemia - Repleted 2g Mg - recheck daily  Macrocytic anemia - Cont daily iron supplement - transfuse if Hgb <7   Poor social situation Currently unhoused. Likely contributing to lack of follow up with PCP and inability to obtain medications, leading to recurrent hospitalizations and chronic disease exacerbations. -TOC consult for dispo planning  - planning SNF placement for dispo  Hyponatremia Has worsening hyponatremia in the past few days. Per nursing, pt is drinking a lot of decaf coffee. Concern that this is in the setting of  excessive fluid intake.  - Start 1500 mL/day fluid restriction   FEN/GI: Regular diet, fluid restriction of 1500 PPx: Xarelto Dispo:SNF today. Barriers include awaiting SNF placement.   Subjective:  Reports that he has had vivid dreams. 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 Unclear if this was him describing his dream or hallucinations/delusions.   Objective: Temp:  [97.5 F (36.4 C)-98.1 F (36.7 C)] 97.7 F (36.5 C) (11/13 1208) Pulse Rate:  [69-98] 88 (11/13 1208) Resp:  [16-20] 16 (11/13 1208) BP: (94-113)/(59-76) 103/72 (11/13 1208) SpO2:  [98 %-100 %] 98 % (11/13 1208) Weight:  [66.2 kg] 66.2 kg (11/13 0425) Physical Exam: General: Friendly older man, laying in bed comfortably. Slow speech. Cardiovascular: RRR. No pitting edema Respiratory: Normal WOB on RA. CTAB. Abdomen: Soft, nontender, nondistended.  Laboratory: Most recent CBC Lab Results  Component Value Date   WBC 4.7 06/01/2022   HGB 8.4 (L) 06/01/2022   HCT 25.3 (L) 06/01/2022   MCV 108.6 (H) 06/01/2022   PLT 388 06/01/2022   Most recent BMP    Latest Ref Rng & Units 06/01/2022    1:48 AM  BMP  Glucose 70 - 99 mg/dL 69   BUN 8 - 23 mg/dL 11   Creatinine 0.61 - 1.24 mg/dL 0.61   Sodium 135 - 145 mmol/L 129   Potassium 3.5 - 5.1 mmol/L 4.1   Chloride 98 - 111 mmol/L 99   CO2 22 - 32 mmol/L 22   Calcium 8.9 - 10.3 mg/dL 8.5    Anthony Dice, MD 06/01/2022, 1:29 PM  PGY-1,  [place] Intern pager: [phone], text pages welcome Secure chat group [place]

## 2022-06-01 NOTE — Progress Notes (Signed)
Occupational Therapy Treatment Patient Details Name: Anthony [name] MRN: [id number] DOB: 1956/07/02 Today's Date: 06/01/2022   History of present illness Pt is 66 y/o M admitted to [facility] 05/19/22 with LE swelling, back pain, SOB, workup for sepsis. Of note, pt with 5 ED visits in past 12 months. PMH to include afib with RVR, CHF, chronic LBP, PEs, depression, delirium, alcohol abuse, homelessness.   OT comments  Pt progressing towards goals this session, completing ADLs with min guard-minA, mod I for bed mobility, and min guard for transfers with RW. Pt able to stand at sink x5 min for ADL, noted drop in BP (see below) however pt asymptomatic throughout session. Pt tangential needing mod cues for redirection throughout.  Pt presenting with impairments listed below, will follow acutely. Continue to recommend SNF at d/c.  BP supine: 100/71  BP seated EOB: 94/51  BP standing x5 min: 78/54 BP seated in chair post transfer 107/76   Recommendations for follow up therapy are one component of a multi-disciplinary discharge planning process, led by the attending physician.  Recommendations may be updated based on patient status, additional functional criteria and insurance authorization.    Follow Up Recommendations  Skilled nursing-short term rehab (<3 hours/day)     Assistance Recommended at Discharge Frequent or constant Supervision/Assistance  Patient can return home with the following  A lot of help with walking and/or transfers;A lot of help with bathing/dressing/bathroom;Assistance with cooking/housework;Direct supervision/assist for medications management;Direct supervision/assist for financial management;Assist for transportation;Help with stairs or ramp for entrance   Equipment Recommendations  None recommended by OT (defer)    Recommendations for Other Services PT consult    Precautions / Restrictions Precautions Precautions: Fall Restrictions Weight Bearing Restrictions: No        Mobility Bed Mobility Overal bed mobility: Modified Independent                  Transfers Overall transfer level: Needs assistance Equipment used: Rolling walker (2 wheels) Transfers: Sit to/from Stand Sit to Stand: Min guard                 Balance Overall balance assessment: Needs assistance Sitting-balance support: Feet supported, No upper extremity supported Sitting balance-Leahy Scale: Good     Standing balance support: During functional activity, No upper extremity supported Standing balance-Leahy Scale: Fair                             ADL either performed or assessed with clinical judgement   ADL Overall ADL's : Needs assistance/impaired     Grooming: Oral care;Sitting;Supervision/safety Grooming Details (indicate cue type and reason): at sink         Upper Body Dressing : Minimal assistance;Sitting   Lower Body Dressing: Minimal assistance;Sitting/lateral leans   Toilet Transfer: Min guard;Rolling walker (2 wheels);Ambulation;Regular Glass blower/designer Details (indicate cue type and reason): simulated to recliner with cues for hand placement Toileting- Clothing Manipulation and Hygiene: Min guard;Sit to/from stand Toileting - Clothing Manipulation Details (indicate cue type and reason): managing     Functional mobility during ADLs: Min guard;Rolling walker (2 wheels)      Extremity/Trunk Assessment Upper Extremity Assessment Upper Extremity Assessment: Generalized weakness   Lower Extremity Assessment Lower Extremity Assessment: Defer to PT evaluation        Vision   Vision Assessment?: No apparent visual deficits   Perception     Praxis      Cognition Arousal/Alertness: Awake/alert  Behavior During Therapy: WFL for tasks assessed/performed Overall Cognitive Status: No family/caregiver present to determine baseline cognitive functioning Area of Impairment: Attention, Memory, Following commands,  Safety/judgement, Awareness, Problem solving                   Current Attention Level: Alternating Memory: Decreased short-term memory Following Commands: Follows one step commands consistently Safety/Judgement: Decreased awareness of safety, Decreased awareness of deficits Awareness: Emergent Problem Solving: Requires verbal cues General Comments: slow processing, tangential in conversation, oriented to day of week and able to recall he swallowed a coin        Exercises      Shoulder Instructions       General Comments orthostatic BP, however pt asymptomatic, see note    Pertinent Vitals/ Pain       Pain Assessment Pain Assessment: No/denies pain Pain Location: back pain Pain Descriptors / Indicators: Discomfort, Grimacing Pain Intervention(s): Limited activity within patient's tolerance, Monitored during session, Repositioned  Home Living                                          Prior Functioning/Environment              Frequency  Min 2X/week        Progress Toward Goals  OT Goals(current goals can now be found in the care plan section)  Progress towards OT goals: Progressing toward goals  Acute Rehab OT Goals Patient Stated Goal: to get better OT Goal Formulation: With patient Time For Goal Achievement: 06/03/22 Potential to Achieve Goals: Good ADL Goals Pt Will Perform Grooming: with modified independence;standing Pt Will Perform Upper Body Dressing: with min guard assist;sitting Pt Will Perform Lower Body Dressing: with min guard assist;sitting/lateral leans;sit to/from stand Pt Will Transfer to Toilet: with supervision;regular height toilet;ambulating Additional ADL Goal #1: Pt will perform 2 step ADL task with min cues  Plan Discharge plan remains appropriate;Frequency remains appropriate    Co-evaluation                 AM-PAC OT "6 Clicks" Daily Activity     Outcome Measure   Help from another person  eating meals?: A Savant Help from another person taking care of personal grooming?: A Goral Help from another person toileting, which includes using toliet, bedpan, or urinal?: A Kobus Help from another person bathing (including washing, rinsing, drying)?: A Drew Help from another person to put on and taking off regular upper body clothing?: A Greenlaw Help from another person to put on and taking off regular lower body clothing?: A Mckiernan 6 Click Score: 18    End of Session Equipment Utilized During Treatment: Gait belt;Rolling walker (2 wheels)  OT Visit Diagnosis: Unsteadiness on feet (R26.81);Other abnormalities of gait and mobility (R26.89);Muscle weakness (generalized) (M62.81)   Activity Tolerance Patient tolerated treatment well   Patient Left in chair;with call bell/phone within reach;with chair alarm set   Nurse Communication Mobility status        Time: 2774-1287 OT Time Calculation (min): 18 min  Charges: OT General Charges $OT Visit: 1 Visit OT Treatments $Self Care/Home Management : 8-22 mins  Lynnda Child, OTD, OTR/L Acute Rehab [phone]) [phone] [place] 06/01/2022, 12:10 PM

## 2022-06-02 ENCOUNTER — Other Ambulatory Visit (HOSPITAL_COMMUNITY): Payer: Self-pay

## 2022-06-02 DIAGNOSIS — R410 Disorientation, unspecified: Secondary | ICD-10-CM | POA: Diagnosis not present

## 2022-06-02 LAB — BASIC METABOLIC PANEL
Anion gap: 5 (ref 5–15)
BUN: 16 mg/dL (ref 8–23)
CO2: 25 mmol/L (ref 22–32)
Calcium: 9.1 mg/dL (ref 8.9–10.3)
Chloride: 106 mmol/L (ref 98–111)
Creatinine, Ser: 0.67 mg/dL (ref 0.61–1.24)
GFR, Estimated: >60 mL/min (ref >60)
Glucose, Bld: 94 mg/dL (ref 70–99)
Potassium: 5 mmol/L (ref 3.5–5.1)
Sodium: 136 mmol/L (ref 135–145)

## 2022-06-02 LAB — CBC
HCT: 27.1 % — ABNORMAL LOW (ref 39.0–52.0)
Hemoglobin: 8.9 g/dL — ABNORMAL LOW (ref 13.0–17.0)
MCH: 35.9 pg — ABNORMAL HIGH (ref 26.0–34.0)
MCHC: 32.8 g/dL (ref 30.0–36.0)
MCV: 109.3 fL — ABNORMAL HIGH (ref 80.0–100.0)
Platelets: 416 10*3/uL — ABNORMAL HIGH (ref 150–400)
RBC: 2.48 MIL/uL — ABNORMAL LOW (ref 4.22–5.81)
RDW: 16.3 % — ABNORMAL HIGH (ref 11.5–15.5)
WBC: 4.7 10*3/uL (ref 4.0–10.5)
nRBC: 0 % (ref 0.0–0.2)

## 2022-06-02 LAB — MAGNESIUM: Magnesium: 1.5 mg/dL — ABNORMAL LOW (ref 1.7–2.4)

## 2022-06-02 MED ORDER — NYSTATIN 100000 UNIT/GM EX POWD
CUTANEOUS | 0 refills | Status: DC | PRN
  Filled 2022-06-02: qty 15, 15d supply, fill #0

## 2022-06-02 MED ORDER — DICLOFENAC SODIUM 1 % EX GEL
2.0000 g | Freq: Two times a day (BID) | CUTANEOUS | 0 refills | Status: DC | PRN
  Filled 2022-06-02: qty 100, 30d supply, fill #0

## 2022-06-02 MED ORDER — METHOCARBAMOL 750 MG PO TABS: 750.0000 mg | ORAL_TABLET | Freq: Three times a day (TID) | ORAL | 0 refills | Status: DC | PRN

## 2022-06-02 MED ORDER — VITAMIN B-1 100 MG PO TABS: 100.0000 mg | ORAL_TABLET | Freq: Every day | ORAL | 0 refills | Status: DC

## 2022-06-02 MED ORDER — ADULT MULTIVITAMIN W/MINERALS CH
1.0000 | ORAL_TABLET | Freq: Every day | ORAL | 0 refills | Status: DC
  Filled 2022-06-02: qty 30, 30d supply, fill #0

## 2022-06-02 MED ORDER — FERROUS SULFATE 325 (65 FE) MG PO TABS
325.0000 mg | ORAL_TABLET | Freq: Every day | ORAL | 3 refills | Status: DC
  Filled 2022-06-02: qty 30, 30d supply, fill #0

## 2022-06-02 MED ORDER — ROSUVASTATIN CALCIUM 10 MG PO TABS
10.0000 mg | ORAL_TABLET | Freq: Every day | ORAL | 1 refills | Status: DC
  Filled 2022-06-02: qty 30, 30d supply, fill #0

## 2022-06-02 MED ORDER — GERHARDT'S BUTT CREAM: 1.0000 | TOPICAL_CREAM | CUTANEOUS | 0 refills | Status: DC | PRN

## 2022-06-02 MED ORDER — METOPROLOL SUCCINATE ER 25 MG PO TB24
12.5000 mg | ORAL_TABLET | Freq: Every day | ORAL | 2 refills | Status: DC
  Filled 2022-06-02: qty 30, 60d supply, fill #0

## 2022-06-02 MED ORDER — MAGNESIUM OXIDE 400 MG PO CAPS: 400.0000 mg | ORAL_CAPSULE | Freq: Two times a day (BID) | ORAL | 0 refills | Status: AC

## 2022-06-02 NOTE — Discharge Summary (Addendum)
[facility] Discharge Summary  Patient name: Anthony [name] Medical record number: [id number] Date of birth: 11-23-1955 Age: 66 y.o. 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  Pertinent past medical history includes heart failure, alcohol use disorder, atrial fibrillation, poor social situation, hypertension.  His hospital course is outlined below.  Atrial fibrillation with RVR ([place]) Patient has been without home medications.  He was started on diltiazem drip, however blood pressure was unable to tolerate.  Switched to amiodarone drip and eventually converted to p.o. amiodarone.  Cardiology was consulted and recommended Amiodarone orally, '400mg'$  BID x 5 days, '200mg'$  BID x7 days then '200mg'$  daily; low-dose beta-blocker; and Xarelto 20 mg daily. Through shared decision making with patient, determined we should discontinue anticoagulation due to patients high risk of falls and alcohol abuse. At time of discharge, pt was rate controlled and will continue Amiodarone '200mg'$  daily and Metop succinate 12.'5mg'$  daily.  Sepsis 2/2 Pneumonia  Fever to 100.7. Blood and urine cultures collected and showed no growth.  CXR showing possible pneumonia.  LA elevated to 5 (which trended down to 1.8 with initiation of maintenance fluids).  WBC elevated to 14.8.  Patient presented with erythema of left lower extremity and concern for cellulitis (DVT ultrasound was negative). Vancomycin and ceftriaxone were started for broad-spectrum coverage.  Patient completed antibiotic course of azithromycin and Rocephin for  pneumonia with stabilization of vital signs and white count.  Medically stable at discharge with no signs of acute infection.  Acute on chronic diastolic CHF [facility]) Patient had +2 pitting bilateral lower extremity edema.  Pulmonary edema on chest x-ray.  BNP elevated at 996. Initially was started on maintenance fluids for sepsis.  Patient remained euvolemic. With soft blood pressures, patient was not restarted on diuretics.   Alcohol use disorder, severe dependence ([place]) Last drink was morning prior to admission.  Ethanol level was 121.  History of multiple delirium tremens. Patient was placed on phenobarbital and Librium taper due to history of DT and current comorbid conditions.  He is was successfully weaned off alcohol and completed Librium and phenobarbital taper.  Medically stable at discharge with no signs of acute alcohol withdrawal.  Back pain Acute on chronic pain with no neurologic deficits.  Patient has unfortunately been sleeping on pensions of side due to housing insecurity.  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  Patient was given Tylenol and Robaxin for pain which improved his back pain.  PT/OT recommended SNF.  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  Stable on repeat x-ray.  GI was consulted and recommended endoscopy and  successfully removed the quarter.  Hyponatremia Developed hyponatremia during admission. Per nursing, pt was drinking a lot of decaf coffee. Na improved with 1500 ml/day fluid restriction.   Hypomagnesemia Noted to have hypomagnesemia that required frequent repletion during this admission. Most likely due to malnutrition. Will start a Mag supplement to continue on discharge.   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 NAD. HEENT: MMM, NCAT Resp: CTAB. Normal WOB on RA. CV: RRR Abm: Soft, nontender, nondistended  Issues for Follow Up:  Diuretics were held during the admission and discontinued at discharge due to hypotension. Consider restarting medication is volume overloaded.  Substance abuse counseling, alcohol cessation. Patient was discontinued on anticoagulation due to substance abuse and history of multiple falls.  Patient has had ongoing hypomagnesemia, please recheck Mg level and replete as appropriate. Patient discharged on daily oral supplementation. Anticoagulation held at discharge after shared decision making with the patient.    Significant Procedures: Coin removed from esophagus via EGD  Significant Labs and Imaging:  Recent Labs  Lab 06/01/22 0148 06/02/22 0203  WBC 4.7 4.7  HGB 8.4* 8.9*  HCT 25.3* 27.1*  PLT 388 416*   Recent Labs  Lab 06/01/22 0148 06/02/22 0203  NA 129* 136  K 4.1 5.0  CL 99 106  CO2 22 25  GLUCOSE 69* 94  BUN 11 16  CREATININE 0.61 0.67  CALCIUM 8.5* 9.1  MG 1.4* 1.5*     Results/Tests Pending at Time of Discharge: None  Discharge Medications:  Allergies as of 06/02/2022       Reactions   Other Itching, Other (See Comments)   Seasonal allergies- Itchy eyes, runny nose, congestion   Poison Ivy Extract Rash        Medication List     STOP taking these medications    acetaminophen 325 MG tablet Commonly known as: TYLENOL   furosemide 20 MG tablet Commonly known as: LASIX   Gas-X 80 MG chewable tablet Generic drug: simethicone   magnesium chloride 64 MG Tbec SR tablet Commonly known as: SLOW-MAG   rivaroxaban 20 MG Tabs tablet Commonly known as: XARELTO       TAKE these medications    amiodarone 200 MG tablet Commonly known as: PACERONE TAKE 1 TABLET (200 MG TOTAL) BY MOUTH DAILY. What changed: how much to take   ascorbic acid 500 MG tablet Commonly known as:  VITAMIN C Take 500 mg by mouth daily as needed (for supplementation).   B Complex Plus Tabs Take 1 tablet by mouth daily as needed (for supplementation).   CALCIUM PO Take 1 tablet by mouth 2 (two) times a week.   diclofenac Sodium 1 % Gel Commonly known as: VOLTAREN Apply 2 g topically 2 (two) times daily as needed (for pain).   Dulera 100-5 MCG/ACT Aero Generic drug: mometasone-formoterol INHALE 2 PUFFS INTO THE LUNGS TWO TIMES DAILY.   ferrous sulfate 325 (65 FE) MG tablet Take 1 tablet (325 mg total) by mouth daily with breakfast. Start taking on: June 03, 2022 What changed:  when to take this reasons to take this   folic acid 1 MG tablet Commonly known as: FOLVITE Take 1 tablet (1 mg total) by mouth daily.   Gerhardt's butt cream Crea Apply 1 Application topically as needed for irritation.   loratadine 10 MG tablet Commonly known as: CLARITIN TAKE 1 TABLET (10 MG TOTAL) BY MOUTH DAILY. What changed: how much to take   Magnesium Oxide 400 MG Caps Take 1 capsule (400 mg total) by mouth 2 (two) times daily.   methocarbamol 750 MG tablet Commonly known as: ROBAXIN Take 1 tablet (750 mg total) by mouth every 8 (eight) hours as needed for muscle spasms. What changed:  when to take this reasons to take this  metoprolol succinate 25 MG 24 hr tablet Commonly known as: TOPROL-XL Take 0.5 tablets (12.5 mg total) by mouth daily. Start taking on: June 03, 2022   multivitamin with minerals Tabs tablet Take 1 tablet by mouth daily. Start taking on: June 03, 2022 What changed: when to take this   nystatin powder Commonly known as: MYCOSTATIN/NYSTOP Apply topically as needed (irritation).   rosuvastatin 10 MG tablet Commonly known as: CRESTOR Take 1 tablet (10 mg total) by mouth daily. Start taking on: June 03, 2022 What changed: how much to take   sertraline 50 MG tablet Commonly known as: ZOLOFT Take 50 mg by mouth daily.   thiamine 100 MG  tablet Commonly known as: VITAMIN B1 Take 1 tablet (100 mg total) by mouth daily.   thiamine 100 MG tablet Commonly known as: Vitamin B-1 Take 1 tablet (100 mg total) by mouth daily. Start taking on: June 03, 2022   VITAMIN D3 PO Take 1 capsule by mouth daily as needed (for supplementation).        Discharge Instructions: Please refer to Patient Instructions section of EMR for full details.  Patient was counseled important signs and symptoms that should prompt return to medical care, changes in medications, dietary instructions, activity restrictions, and follow up appointments.   Follow-Up Appointments:  Contact information for follow-up providers     [name], MD. Schedule an appointment as soon as possible for a visit.   Specialties: Gastroenterology, Internal Medicine Why: Please schedule an appointment to be seen within 6-8 weeks after discharge. Contact information: [place]. [place] [address] [phone]         Orvis Brill, DO. Go on 06/09/2022.   Specialty: Family Medicine Why: Appointment at 1:50 pm, please arrive at least 15 min prior to your scheduled appointment time. Contact information: [place] [address] [phone]              Contact information for after-discharge care     [facility] .   Service: Skilled Nursing Contact information: [place] [phone]                     Arlyce Dice, MD 06/02/2022, 12:33 PM PGY-1, [place]    I was personally present and re-performed the exam. I verified the service and findings are accurately documented in the intern note.  Alen Bleacher, MD 06/02/2022 12:39 PM

## 2022-06-02 NOTE — TOC Progression Note (Addendum)
Transition of Care [facility]) - Progression Note    Patient Details  Name: MAICOL BOWLAND MRN: [id number] Date of Birth: Jun 07, 1956  [facility] CM/SW [place], LCSW Phone Number: 06/02/2022, 9:03 AM  Clinical Narrative:    CSW received insurance approval for [facility], Ref# [id number], Auth ID # [id number], effective 06/01/2022-06/03/2022. [facility] aware and patient will go to room B18 bed 1.   CSW spoke with patient and made him aware of discharge plan for today. Patient stated he did not agree to go to SNF because they would take his check. CSW explained that he is going under rehab through his Medicare and that it would be up to him after that if he chose to stay at [facility]. CSW inquired where he would discharge to and he stated back to the street where he could buy a tent. CSW encouraged him to consider if he should stay at SNF long enough to wait out the winter months. He reported he will try it. CSW will arrange PTAR for transport.     Expected Discharge Plan: [place] Barriers to Discharge: Continued Medical Work up  Expected Discharge Plan and Services Expected Discharge Plan: [place] In-house Referral: Clinical Social Work     Living arrangements for the past 2 months:  (homeless)                                       Social Determinants of Health (SDOH) Interventions Food Insecurity Interventions: Other (Comment) (Patient provided food resources) Transportation Interventions:  (Patient will need bus pass at dc)  Readmission Risk Interventions    10/08/2020    2:48 PM  Readmission Risk Prevention Plan  Transportation Screening Complete  PCP or Specialist Appt within 3-5 Days Complete  HRI or [place] Complete  Social Work Consult for [place] Planning/Counseling Complete  Palliative Care Screening Complete  Medication Review Press photographer) Complete

## 2022-06-02 NOTE — TOC Transition Note (Signed)
Transition of Care [facility]) - CM/SW Discharge Note   Patient Details  Name: Anthony [name] MRN: [id number] Date of Birth: [birth year]  [facility]) CM/SW Contact:  Benard Halsted, [place] Phone Number: 06/02/2022, 1:31 PM   Clinical Narrative:    Patient will DC to: [facility]  Anticipated DC date: 06/02/22  Family notified: NA Transport by: Corey Harold   Per MD patient ready for DC to [place]. RN to call report prior to discharge ([phone] room B18 bed 1 ). RN, patient, patient's family, and facility notified of DC. Discharge Summary and FL2 sent to facility. DC packet on chart. Ambulance transport requested for patient.   CSW will sign off for now as social work intervention is no longer needed. Please consult [place] again if new needs arise.     Final next level of care: Skilled Nursing Facility Barriers to Discharge: Barriers Resolved   Patient Goals and CMS Choice Patient states their goals for this hospitalization and ongoing recovery are:: SNF CMS Medicare.gov Compare Post Acute Care list provided to:: Patient Choice offered to / list presented to : Patient  Discharge Placement   Existing PASRR number confirmed : 06/02/22          Patient chooses bed at: [facility] Patient to be transferred to facility by: [place] Name of family member notified: NA Patient and family notified of of transfer: 06/02/22  Discharge Plan and Services In-house Referral: Clinical Social Work                                   Social Determinants of Health (SDOH) Interventions Food Insecurity Interventions: Other (Comment) (Patient provided food resources) Transportation Interventions:  (Patient will need bus pass at dc)   Readmission Risk Interventions    10/08/2020    2:48 PM  Readmission Risk Prevention Plan  Transportation Screening Complete  PCP or Specialist Appt within 3-5 Days Complete  HRI or [place] Complete  Social Work Consult for  [place] Planning/Counseling Complete  Palliative Care Screening Complete  Medication Review Press photographer) Complete

## 2022-06-02 NOTE — Progress Notes (Signed)
Speech Language Pathology Treatment: Dysphagia  Patient Details Name: Anthony [name] MRN: [id number] DOB: 08/05/1955 Today's Date: 06/02/2022 Time: 4709-2957 SLP Time Calculation (min) (ACUTE ONLY): 21 min  Assessment / Plan / Recommendation Clinical Impression  Pt was seen for f/u after recent advancement to regular textures. He says he can swallow, but certain things are hard to chew and hurt his gums. He says he has to take breaks and needs his food to be cut up. Again we discussed the option of a mechanical soft diet, but I think he is concerned that I am trying to recommend the purees again, and despite attempts at education, he continues to decline this diet. Will leave him on regular solids and thin liquids. No further f/u needed, but he can always downgrade his diet per his preference.    HPI HPI: Pt is a 66 yo male presenting with back pain, SOB, BLE swelling. Work up still underway but with concern for CHF exacerbation, sepsis. Pt found to have foreign body (suspected coin) in his distal esophagus with endoscopy currently on hold so cleared for clear liquids only per GI. Two prior swallow evals with functional appearing swallow in light of limited dentition, recommending Dys 3-regular solids and thin liquids. PMH includes: HH, EtOH and tobacco use, a-fib, anxiety, HFpEF, HTN, unhoused for the past year      SLP Plan  All goals met      Recommendations for follow up therapy are one component of a multi-disciplinary discharge planning process, led by the attending physician.  Recommendations may be updated based on patient status, additional functional criteria and insurance authorization.    Recommendations  Diet recommendations: Regular;Thin liquid Liquids provided via: Cup;Straw Medication Administration: Whole meds with puree Supervision: Patient able to self feed;Full supervision/cueing for compensatory strategies Compensations: Slow rate;Small sips/bites Postural Changes  and/or Swallow Maneuvers: Seated upright 90 degrees;Upright 30-60 min after meal                Oral Care Recommendations: Oral care BID Follow Up Recommendations: No SLP follow up Assistance recommended at discharge: None SLP Visit Diagnosis: Dysphagia, unspecified (R13.10) Plan: All goals met           [name]., M.A. [place] Office [phone]  Secure chat preferred   06/02/2022, 11:07 AM

## 2022-06-02 NOTE — Progress Notes (Signed)
Physical Therapy Treatment Patient Details Name: Anthony [name] MRN: [id number] DOB: 19-Jun-1956 Today's Date: 06/02/2022   History of Present Illness Pt is 66 y/o M admitted to [facility] 05/19/22 with LE swelling, back pain, SOB, workup for sepsis. Of note, pt with 5 ED visits in past 12 months. PMH to include afib with RVR, CHF, chronic LBP, PEs, depression, delirium, alcohol abuse, homelessness.    PT Comments    Pt agreeable to ambulation with PT. Pt with reduced delusional conversation during mobilization, and reasoned think regarding why he does not want to go to SNF. Pt limited in safe mobility by decreased higher level balance. Pt mod I for bed mobility and transfers and supervision for ambulation. D/c plans remain appropriate at this time. Plan for d/c to SNF this afternoon, however PT will continue to follow acutely if plans change.    Recommendations for follow up therapy are one component of a multi-disciplinary discharge planning process, led by the attending physician.  Recommendations may be updated based on patient status, additional functional criteria and insurance authorization.  Follow Up Recommendations  Skilled nursing-short term rehab (<3 hours/day) Can patient physically be transported by private vehicle: Yes   Assistance Recommended at Discharge Intermittent Supervision/Assistance  Patient can return home with the following A Bevan help with walking and/or transfers;A Bullinger help with bathing/dressing/bathroom;Assist for transportation;Help with stairs or ramp for entrance;Direct supervision/assist for medications management;Direct supervision/assist for financial management;Assistance with Landscape architect (4 wheels)    Recommendations for Other Services       Precautions / Restrictions Precautions Precautions: Fall Restrictions Weight Bearing Restrictions: No     Mobility  Bed Mobility Overal bed mobility: Modified  Independent Bed Mobility: Supine to Sit, Sit to Supine     Supine to sit: Modified independent (Device/Increase time) Sit to supine: Modified independent (Device/Increase time)        Transfers Overall transfer level: Needs assistance Equipment used: None Transfers: Sit to/from Stand Sit to Stand: Min guard           General transfer comment: Assist for safety and lines    Ambulation/Gait Ambulation/Gait assistance: Supervision Gait Distance (Feet): 540 Feet Assistive device: Rollator (4 wheels), None Gait Pattern/deviations: Narrow base of support, Decreased stride length, Shuffle, Trunk flexed Gait velocity: decreased Gait velocity interpretation: 1.31 - 2.62 ft/sec, indicative of limited community ambulator   General Gait Details: Supervision for safety with ambulation with RW       Balance Overall balance assessment: Needs assistance Sitting-balance support: Feet supported, No upper extremity supported Sitting balance-Leahy Scale: Good     Standing balance support: During functional activity, No upper extremity supported Standing balance-Leahy Scale: Fair Standing balance comment: Reliant on at least 1 UE support or assistance for standing mobility                            Cognition Arousal/Alertness: Awake/alert Behavior During Therapy: WFL for tasks assessed/performed Overall Cognitive Status: No family/caregiver present to determine baseline cognitive functioning Area of Impairment: Memory, Following commands, Safety/judgement, Awareness, Problem solving                     Memory: Decreased short-term memory Following Commands: Follows one step commands consistently Safety/Judgement: Decreased awareness of safety, Decreased awareness of deficits Awareness: Anticipatory Problem Solving: Requires verbal cues General Comments: pt less delusional today, reasoned argument for not going to SNF because of use of the  majority of his  Social Security           General Comments General comments (skin integrity, edema, etc.): VSS on RA      Pertinent Vitals/Pain Pain Assessment Pain Assessment: Faces Faces Pain Scale: No hurt     PT Goals (current goals can now be found in the care plan section) Acute Rehab PT Goals Patient Stated Goal: to improve PT Goal Formulation: With patient Time For Goal Achievement: 06/03/22 Potential to Achieve Goals: Fair Progress towards PT goals: Progressing toward goals    Frequency    Min 3X/week      PT Plan Current plan remains appropriate       AM-PAC PT "6 Clicks" Mobility   Outcome Measure  Help needed turning from your back to your side while in a flat bed without using bedrails?: None Help needed moving from lying on your back to sitting on the side of a flat bed without using bedrails?: None Help needed moving to and from a bed to a chair (including a wheelchair)?: A Adorno Help needed standing up from a chair using your arms (e.g., wheelchair or bedside chair)?: A Abernethy Help needed to walk in hospital room?: A Vandervelden Help needed climbing 3-5 steps with a railing? : A Lot 6 Click Score: 19    End of Session Equipment Utilized During Treatment: Gait belt Activity Tolerance: Patient tolerated treatment well Patient left: with call bell/phone within reach;in bed;with bed alarm set Nurse Communication: Mobility status;Other (comment) PT Visit Diagnosis: Other abnormalities of gait and mobility (R26.89);Unsteadiness on feet (R26.81)     Time: 7829-5621 PT Time Calculation (min) (ACUTE ONLY): 22 min  Charges:  $Gait Training: 8-22 mins                     Blonnie Maske B. Migdalia Dk PT, DPT Acute Rehabilitation Services Please use secure chat or  Call Office [phone]    [place] 06/02/2022, 3:10 PM

## 2022-06-02 NOTE — Care Management Important Message (Signed)
Important Message  Patient Details  Name: Anthony [name] MRN: [id number] Date of Birth: [birth year]   Medicare Important Message Given:  Yes     Shelda Altes 06/02/2022, 12:11 PM

## 2022-06-03 ENCOUNTER — Other Ambulatory Visit: Payer: Self-pay

## 2022-06-03 DIAGNOSIS — R109 Unspecified abdominal pain: Secondary | ICD-10-CM

## 2022-06-04 ENCOUNTER — Telehealth: Payer: Self-pay | Admitting: Gastroenterology

## 2022-06-04 NOTE — Telephone Encounter (Unsigned)
Spoke with Case Worker Ebony Hail. Ebony Hail stated that she will give me a call back when she is at her desk: Direct number provided:

## 2022-06-04 NOTE — Telephone Encounter (Signed)
Patient's brother is returning your call and did not know why we were calling him, was to too happy about it. He stated he knows very [place] about his brother at this time all he knows is that he was at [facility] and was sent to a rehab center. He said you would perhaps want to call a case worker that is assigned to the patient. Her name is Ebony Hail and her number is [phone]. He said you can call him back if you wanted to.

## 2022-06-05 NOTE — Telephone Encounter (Unsigned)
Spoke with Case worker Ebony Hail at [facility]: Ebony Hail stated that she is not following him and is unsure of his whereabouts: Spoke with Pt daughter Anthony [name] and she stated that she has not seen her father in years and stated that he was homeless, alcoholic and a sad situation:

## 2022-06-08 NOTE — Telephone Encounter (Signed)
Chart reviewed: Noted that pt was in [place]: [place] contacted and Theodosia Paling made aware of Dr. Lyndel Safe recommendations for pt have labs drawn: Lab orders have been previous entered: Delcie Roch given location to lab [place] verbalized understanding with all questions answered.

## 2022-06-09 ENCOUNTER — Ambulatory Visit: Payer: Self-pay | Admitting: Student

## 2022-07-16 ENCOUNTER — Emergency Department (HOSPITAL_COMMUNITY)
Admission: EM | Admit: 2022-07-16 | Discharge: 2022-07-17 | Disposition: A | Discharge status: Home / Self Care | Payer: 59 | Attending: Emergency Medicine | Admitting: Emergency Medicine

## 2022-07-16 ENCOUNTER — Encounter (HOSPITAL_COMMUNITY): Payer: Self-pay | Admitting: Emergency Medicine

## 2022-07-16 ENCOUNTER — Emergency Department (HOSPITAL_COMMUNITY): Payer: 59

## 2022-07-16 DIAGNOSIS — F10129 Alcohol abuse with intoxication, unspecified: Secondary | ICD-10-CM | POA: Diagnosis present

## 2022-07-16 DIAGNOSIS — Z79899 Other long term (current) drug therapy: Secondary | ICD-10-CM | POA: Insufficient documentation

## 2022-07-16 DIAGNOSIS — E876 Hypokalemia: Secondary | ICD-10-CM | POA: Insufficient documentation

## 2022-07-16 DIAGNOSIS — F10920 Alcohol use, unspecified with intoxication, uncomplicated: Secondary | ICD-10-CM

## 2022-07-16 DIAGNOSIS — F1020 Alcohol dependence, uncomplicated: Secondary | ICD-10-CM | POA: Diagnosis not present

## 2022-07-16 DIAGNOSIS — Z59819 Housing instability, housed unspecified: Secondary | ICD-10-CM | POA: Diagnosis not present

## 2022-07-16 DIAGNOSIS — Y905 Blood alcohol level of 100-119 mg/100 ml: Secondary | ICD-10-CM | POA: Insufficient documentation

## 2022-07-16 DIAGNOSIS — K029 Dental caries, unspecified: Secondary | ICD-10-CM | POA: Diagnosis not present

## 2022-07-16 LAB — ETHANOL: Alcohol, Ethyl (B): 113 mg/dL — ABNORMAL HIGH (ref <10)

## 2022-07-16 LAB — COMPREHENSIVE METABOLIC PANEL
ALT: 12 U/L (ref 0–44)
AST: 36 U/L (ref 15–41)
Albumin: 2.9 g/dL — ABNORMAL LOW (ref 3.5–5.0)
Alkaline Phosphatase: 71 U/L (ref 38–126)
Anion gap: 14 (ref 5–15)
BUN: 11 mg/dL (ref 8–23)
CO2: 19 mmol/L — ABNORMAL LOW (ref 22–32)
Calcium: 8.4 mg/dL — ABNORMAL LOW (ref 8.9–10.3)
Chloride: 102 mmol/L (ref 98–111)
Creatinine, Ser: 1 mg/dL (ref 0.61–1.24)
GFR, Estimated: >60 mL/min (ref >60)
Glucose, Bld: 76 mg/dL (ref 70–99)
Potassium: 3.4 mmol/L — ABNORMAL LOW (ref 3.5–5.1)
Sodium: 135 mmol/L (ref 135–145)
Total Bilirubin: 0.8 mg/dL (ref 0.3–1.2)
Total Protein: 7 g/dL (ref 6.5–8.1)

## 2022-07-16 LAB — CBC WITH DIFFERENTIAL/PLATELET
Abs Immature Granulocytes: 0.03 10*3/uL (ref 0.00–0.07)
Basophils Absolute: 0 10*3/uL (ref 0.0–0.1)
Basophils Relative: 1 %
Eosinophils Absolute: 0 10*3/uL (ref 0.0–0.5)
Eosinophils Relative: 0 %
HCT: 33.5 % — ABNORMAL LOW (ref 39.0–52.0)
Hemoglobin: 10.9 g/dL — ABNORMAL LOW (ref 13.0–17.0)
Immature Granulocytes: 1 %
Lymphocytes Relative: 24 %
Lymphs Abs: 1.5 10*3/uL (ref 0.7–4.0)
MCH: 34.6 pg — ABNORMAL HIGH (ref 26.0–34.0)
MCHC: 32.5 g/dL (ref 30.0–36.0)
MCV: 106.3 fL — ABNORMAL HIGH (ref 80.0–100.0)
Monocytes Absolute: 0.5 10*3/uL (ref 0.1–1.0)
Monocytes Relative: 8 %
Neutro Abs: 4.2 10*3/uL (ref 1.7–7.7)
Neutrophils Relative %: 66 %
Platelets: 312 10*3/uL (ref 150–400)
RBC: 3.15 MIL/uL — ABNORMAL LOW (ref 4.22–5.81)
RDW: 18.4 % — ABNORMAL HIGH (ref 11.5–15.5)
WBC: 6.4 10*3/uL (ref 4.0–10.5)
nRBC: 0 % (ref 0.0–0.2)

## 2022-07-16 LAB — TROPONIN I (HIGH SENSITIVITY): Troponin I (High Sensitivity): 9 ng/L (ref <18)

## 2022-07-16 LAB — D-DIMER, QUANTITATIVE: D-Dimer, Quant: 0.99 ug/mL-FEU — ABNORMAL HIGH (ref 0.00–0.50)

## 2022-07-16 NOTE — ED Triage Notes (Addendum)
PT is intoxicated. Manager called EMS at [facility]. He had cup of alcohol when EMS arrived. Pt is alert and answers questions appropriately. Pt drinks every day. He reports he is in chronic pain in back and teeth are rotten. He reports he hasn't been eating due to mouth pain. He reports that he has SOB with exertion.

## 2022-07-16 NOTE — ED Provider Triage Note (Signed)
Emergency Medicine Provider Triage Evaluation Note  Trini A Ruark , a 66 y.o. male  was evaluated in triage.  Pt complains of EMS being called at [facility] d/t alcohol intoxication. Notes he has chronic shortness of breath, that has been worse while walking. Denies chest pain.  Review of Systems  Positive: SOB Negative: Chest pain  Physical Exam  BP 102/78 (BP Location: Right Arm)   Pulse (!) 106   Temp 98 F (36.7 C)   Resp (!) 24   SpO2 98%  Gen:   Awake, no distress   Resp:  Normal effort  MSK:   Moves extremities without difficulty  Other:    Medical Decision Making  Medically screening exam initiated at 8:58 PM.  Appropriate orders placed.  Orvile A Lotter was informed that the remainder of the evaluation will be completed by another provider, this initial triage assessment does not replace that evaluation, and the importance of remaining in the ED until their evaluation is complete.    Osvaldo Shipper, [place] 07/16/22 2059

## 2022-07-17 DIAGNOSIS — F1020 Alcohol dependence, uncomplicated: Secondary | ICD-10-CM | POA: Diagnosis not present

## 2022-07-17 MED ORDER — POTASSIUM CHLORIDE CRYS ER 20 MEQ PO TBCR
40.0000 meq | EXTENDED_RELEASE_TABLET | Freq: Once | ORAL | Status: AC
  Administered 2022-07-17: 40 meq via ORAL
  Filled 2022-07-17: qty 2

## 2022-07-17 MED ORDER — MAGNESIUM OXIDE -MG SUPPLEMENT 400 (240 MG) MG PO TABS
800.0000 mg | ORAL_TABLET | Freq: Once | ORAL | Status: AC
  Administered 2022-07-17: 800 mg via ORAL
  Filled 2022-07-17: qty 2

## 2022-07-17 MED ORDER — ACETAMINOPHEN 325 MG PO TABS
650.0000 mg | ORAL_TABLET | Freq: Once | ORAL | Status: AC
  Administered 2022-07-17: 650 mg via ORAL
  Filled 2022-07-17: qty 2

## 2022-07-17 NOTE — ED Notes (Signed)
Pt has been discharged. Pt refusing to let staff get vitals. Pt refusing to leave. Pt states he will only leave if he is arrested. [employer] and security are at bedside.

## 2022-07-17 NOTE — ED Notes (Signed)
This NT attempted to get an EKG on pt pt kept refusing and making request for medicine and a new clock in the room. NT asked 3x to get an EKG pt refused each time. Nurse was notified.

## 2022-07-17 NOTE — ED Notes (Signed)
Pt belligerant, demanding, argumentative, unable or unwilling to follow directions. Unable to reason. Will not go to room. Circular rambling arguments and statements. Given plan. Demanding timeframe and details. Security called to area, and present.

## 2022-07-17 NOTE — ED Provider Notes (Addendum)
[place] EMERGENCY DEPARTMENT Provider Note   CSN: [id number] Arrival date & time: 07/16/22  2040     History  Chief Complaint  Patient presents with   Alcohol Intoxication    Anthony [name] is a 66 y.o. male.  Pt to ED via EMS last pm with etoh intoxication. Pt at local store noted to be drinking alcohol. Pt denies specific physical c/o other than wanting something to eat or drink. Also states teeth chronically 'bad', decayed. Denies trauma or fall. No headache. No chest pain or sob. No abd pain or nv. No extremity pain or swelling.   The history is provided by the patient, medical records and the EMS personnel. The history is limited by the condition of the patient.  Alcohol Intoxication Pertinent negatives include no chest pain, no abdominal pain, no headaches and no shortness of breath.       Home Medications Prior to Admission medications   Medication Sig Start Date End Date Taking? Authorizing Provider  amiodarone (PACERONE) 200 MG tablet TAKE 1 TABLET (200 MG TOTAL) BY MOUTH DAILY. Patient taking differently: Take 200 mg by mouth daily. 03/25/22 03/25/23  Geradine Girt, DO  ascorbic acid (VITAMIN C) 500 MG tablet Take 500 mg by mouth daily as needed (for supplementation).    [provider]  B Complex-Folic Acid (B COMPLEX PLUS) TABS Take 1 tablet by mouth daily as needed (for supplementation).    [provider]  CALCIUM PO Take 1 tablet by mouth 2 (two) times a week.    [provider]  Cholecalciferol (VITAMIN D3 PO) Take 1 capsule by mouth daily as needed (for supplementation).    [provider]  diclofenac Sodium (VOLTAREN) 1 % GEL Apply 2 g topically 2 (two) times daily as needed (for pain). 06/02/22   Arlyce Dice, MD  ferrous sulfate 325 (65 FE) MG tablet Take 1 tablet (325 mg total) by mouth daily with breakfast. 06/03/22   Arlyce Dice, MD  folic acid (FOLVITE) 1 MG tablet Take 1 tablet (1 mg total) by mouth  daily. 03/20/22   Sharion Settler, DO  loratadine (CLARITIN) 10 MG tablet TAKE 1 TABLET (10 MG TOTAL) BY MOUTH DAILY. Patient taking differently: Take 10 mg by mouth daily. 10/08/20 03/23/22  Lilland, Alana, DO  methocarbamol (ROBAXIN) 750 MG tablet Take 1 tablet (750 mg total) by mouth every 8 (eight) hours as needed for muscle spasms. 06/02/22   Arlyce Dice, MD  metoprolol succinate (TOPROL-XL) 25 MG 24 hr tablet Take 0.5 tablets (12.5 mg total) by mouth daily. 06/03/22   Arlyce Dice, MD  mometasone-formoterol (DULERA) 100-5 MCG/ACT AERO INHALE 2 PUFFS INTO THE LUNGS TWO TIMES DAILY. 03/19/22 03/19/23  Sharion Settler, DO  Multiple Vitamin (MULTIVITAMIN WITH MINERALS) TABS tablet Take 1 tablet by mouth daily. 06/03/22   Arlyce Dice, MD  Nystatin (GERHARDT'S BUTT CREAM) CREA Apply 1 Application topically as needed for irritation. 06/02/22   Arlyce Dice, MD  nystatin (MYCOSTATIN/NYSTOP) powder Apply topically as needed (irritation). 06/02/22   Arlyce Dice, MD  rosuvastatin (CRESTOR) 10 MG tablet Take 1 tablet (10 mg total) by mouth daily. 06/03/22   Arlyce Dice, MD  sertraline (ZOLOFT) 50 MG tablet Take 50 mg by mouth daily.    [provider]  thiamine (VITAMIN B1) 100 MG tablet Take 1 tablet (100 mg total) by mouth daily. 03/20/22   Sharion Settler, DO      Allergies    Other and Poison ivy extract  Review of Systems   Review of Systems  Constitutional:  Negative for chills and fever.  HENT:  Positive for dental problem. Negative for sore throat.   Eyes:  Negative for redness.  Respiratory:  Negative for cough and shortness of breath.   Cardiovascular:  Negative for chest pain and leg swelling.  Gastrointestinal:  Negative for abdominal pain, diarrhea and vomiting.  Genitourinary:  Negative for flank pain.  Musculoskeletal:  Negative for back pain and neck pain.  Skin:  Negative for rash.  Neurological:  Negative for headaches.  Hematological:  Does not bruise/bleed  easily.    Physical Exam Updated Vital Signs BP 104/64 (BP Location: Right Arm)   Pulse 97   Temp 98.1 F (36.7 C)   Resp 19   SpO2 97%  Physical Exam Vitals and nursing note reviewed.  Constitutional:      Appearance: Normal appearance. He is well-developed.  HENT:     Head: Atraumatic.     Nose: Nose normal.     Mouth/Throat:     Mouth: Mucous membranes are moist.     Pharynx: Oropharynx is clear.     Comments: Multiple chronically absent teeth, and multiple chronic appearing decayed teeth. No dental abscess noted. No trismus. No facial or neck swelling noted.  Eyes:     General: No scleral icterus.    Conjunctiva/sclera: Conjunctivae normal.     Pupils: Pupils are equal, round, and reactive to light.  Neck:     Trachea: No tracheal deviation.  Cardiovascular:     Rate and Rhythm: Normal rate and regular rhythm.     Pulses: Normal pulses.     Heart sounds: Normal heart sounds. No murmur heard.    No friction rub. No gallop.  Pulmonary:     Effort: Pulmonary effort is normal. No accessory muscle usage or respiratory distress.     Breath sounds: Normal breath sounds.  Abdominal:     General: Bowel sounds are normal. There is no distension.     Palpations: Abdomen is soft.     Tenderness: There is no abdominal tenderness.  Genitourinary:    Comments: No cva tenderness. Musculoskeletal:        General: No swelling or tenderness.     Cervical back: Normal range of motion and neck supple. No rigidity or tenderness.     Right lower leg: No edema.  Skin:    General: Skin is warm and dry.     Findings: No rash.  Neurological:     Mental Status: He is alert.     Comments: Alert, speech clear. Motor/sens grossly intact bil. Steady gait.   Psychiatric:        Mood and Affect: Mood normal.     ED Results / Procedures / Treatments   Labs (all labs ordered are listed, but only abnormal results are displayed) Results for orders placed or performed during the hospital  encounter of 07/16/22  Comprehensive metabolic panel  Result Value Ref Range   Sodium 135 135 - 145 mmol/L   Potassium 3.4 (L) 3.5 - 5.1 mmol/L   Chloride 102 98 - 111 mmol/L   CO2 19 (L) 22 - 32 mmol/L   Glucose, Bld 76 70 - 99 mg/dL   BUN 11 8 - 23 mg/dL   Creatinine, Ser 1.00 0.61 - 1.24 mg/dL   Calcium 8.4 (L) 8.9 - 10.3 mg/dL   Total Protein 7.0 6.5 - 8.1 g/dL   Albumin 2.9 (L) 3.5 - 5.0 g/dL  AST 36 15 - 41 U/L   ALT 12 0 - 44 U/L   Alkaline Phosphatase 71 38 - 126 U/L   Total Bilirubin 0.8 0.3 - 1.2 mg/dL   GFR, Estimated >60 >60 mL/min   Anion gap 14 5 - 15  CBC with Differential  Result Value Ref Range   WBC 6.4 4.0 - 10.5 K/uL   RBC 3.15 (L) 4.22 - 5.81 MIL/uL   Hemoglobin 10.9 (L) 13.0 - 17.0 g/dL   HCT 33.5 (L) 39.0 - 52.0 %   MCV 106.3 (H) 80.0 - 100.0 fL   MCH 34.6 (H) 26.0 - 34.0 pg   MCHC 32.5 30.0 - 36.0 g/dL   RDW 18.4 (H) 11.5 - 15.5 %   Platelets 312 150 - 400 K/uL   nRBC 0.0 0.0 - 0.2 %   Neutrophils Relative % 66 %   Neutro Abs 4.2 1.7 - 7.7 K/uL   Lymphocytes Relative 24 %   Lymphs Abs 1.5 0.7 - 4.0 K/uL   Monocytes Relative 8 %   Monocytes Absolute 0.5 0.1 - 1.0 K/uL   Eosinophils Relative 0 %   Eosinophils Absolute 0.0 0.0 - 0.5 K/uL   Basophils Relative 1 %   Basophils Absolute 0.0 0.0 - 0.1 K/uL   Immature Granulocytes 1 %   Abs Immature Granulocytes 0.03 0.00 - 0.07 K/uL  Ethanol  Result Value Ref Range   Alcohol, Ethyl (B) 113 (H) <10 mg/dL  D-dimer, quantitative  Result Value Ref Range   D-Dimer, Quant 0.99 (H) 0.00 - 0.50 ug/mL-FEU  Troponin I (High Sensitivity)  Result Value Ref Range   Troponin I (High Sensitivity) 9 <18 ng/L   DG Chest 2 View  Result Date: 07/16/2022 CLINICAL DATA:  Chronic shortness of breath EXAM: CHEST - 2 VIEW COMPARISON:  05/19/2022 FINDINGS: Stable cardiomediastinal silhouette. Aortic atherosclerotic calcification. No focal consolidation, pleural effusion, or pneumothorax. No acute osseous abnormality.  Biapical pleural-parenchymal scarring. IMPRESSION: No active cardiopulmonary disease. Electronically Signed   By: Placido Sou M.D.   On: 07/16/2022 22:00    EKG None  Radiology DG Chest 2 View  Result Date: 07/16/2022 CLINICAL DATA:  Chronic shortness of breath EXAM: CHEST - 2 VIEW COMPARISON:  05/19/2022 FINDINGS: Stable cardiomediastinal silhouette. Aortic atherosclerotic calcification. No focal consolidation, pleural effusion, or pneumothorax. No acute osseous abnormality. Biapical pleural-parenchymal scarring. IMPRESSION: No active cardiopulmonary disease. Electronically Signed   By: Placido Sou M.D.   On: 07/16/2022 22:00    Procedures Procedures    Medications Ordered in ED Medications  potassium chloride SA (KLOR-CON M) CR tablet 40 mEq (has no administration in time range)    ED Course/ Medical Decision Making/ A&P                           Medical Decision Making Problems Addressed: Acute alcoholic intoxication without complication [facility]): acute illness or injury with systemic symptoms that poses a threat to life or bodily functions Chronic alcoholism ([place]): chronic illness or injury with exacerbation, progression, or side effects of treatment that poses a threat to life or bodily functions Dental caries: chronic illness or injury Housing instability: chronic illness or injury that poses a threat to life or bodily functions Hypokalemia: acute illness or injury  Amount and/or Complexity of Data Reviewed Independent Historian: EMS    Details: hx External Data Reviewed: notes. Labs: ordered. Decision-making details documented in ED Course. Radiology: ordered and independent interpretation performed. Decision-making details documented  in ED Course.  Risk OTC drugs. Prescription drug management. Decision regarding hospitalization.  Iv ns. Continuous pulse ox and cardiac monitoring. Labs ordered/sent. Imaging ordered.   Diff dx includes etoh intoxication,  dehydration, hypokalemia, aki, etc - dispo decision including potential need for admission considered - will get labs and imaging and reassess.   Reviewed nursing notes and prior charts for additional history. External reports reviewed. Additional history from: EMS.   Cardiac monitor: sinus rhythm, rate 90.  Labs reviewed/interpreted by me - etoh high. K sl low. Kcl po.   Xrays reviewed/interpreted by me - no pna.   Po fluids/food provided. Kcl.  Pt ambulates w steady gait. No current c/o. No chest pain. No sob. No abd pain.   Pt encouraged to maintain sobriety/avoid etoh - also will provide resource guide to help access variety of etoh tx programs.  Pt refuses further tests/lab work, meds, requests d/c.   Pt appears stable for d/c.   Rec pcp f/u.  Return precautions provided.            Final Clinical Impression(s) / ED Diagnoses Final diagnoses:  None    Rx / DC Orders ED Discharge Orders     None          Lajean Saver, MD 07/17/22 1003

## 2022-07-17 NOTE — Discharge Instructions (Addendum)
It was our pleasure to provide your ER care today - we hope that you feel better.  Avoid alcohol use as it is harmful to your physical health and mental well-being. See resource guide attached in terms of accessing inpatient or outpatient alcohol use treatment programs.   Follow up closely with primary care doctor in the coming week. For dental issues, follow up closely with dentist in the next 1-2 weeks.   Your potassium level is mildly low - eat plenty of fruits and vegetables and follow up with primary care doctor.   For mental health issues and/or crisis, you may also go directly to the [facility] - they are open 24/7 and walk-ins are welcome.    Return to ER if worse, new symptoms, fevers, chest pain, trouble breathing, or other emergency concern.

## 2022-07-18 ENCOUNTER — Other Ambulatory Visit: Payer: Self-pay

## 2022-07-18 ENCOUNTER — Encounter (HOSPITAL_COMMUNITY): Payer: Self-pay

## 2022-07-18 ENCOUNTER — Emergency Department (HOSPITAL_COMMUNITY)
Admission: EM | Admit: 2022-07-18 | Discharge: 2022-07-19 | Disposition: A | Discharge status: Home / Self Care | Payer: 59 | Attending: Emergency Medicine | Admitting: Emergency Medicine

## 2022-07-18 DIAGNOSIS — I10 Essential (primary) hypertension: Secondary | ICD-10-CM | POA: Diagnosis not present

## 2022-07-18 DIAGNOSIS — F10920 Alcohol use, unspecified with intoxication, uncomplicated: Secondary | ICD-10-CM | POA: Diagnosis not present

## 2022-07-18 DIAGNOSIS — R0602 Shortness of breath: Secondary | ICD-10-CM | POA: Insufficient documentation

## 2022-07-18 DIAGNOSIS — F1490 Cocaine use, unspecified, uncomplicated: Secondary | ICD-10-CM | POA: Diagnosis not present

## 2022-07-18 DIAGNOSIS — F1010 Alcohol abuse, uncomplicated: Secondary | ICD-10-CM | POA: Diagnosis not present

## 2022-07-18 DIAGNOSIS — R4182 Altered mental status, unspecified: Secondary | ICD-10-CM | POA: Diagnosis present

## 2022-07-18 LAB — COMPREHENSIVE METABOLIC PANEL
ALT: 11 U/L (ref 0–44)
AST: 22 U/L (ref 15–41)
Albumin: 2.6 g/dL — ABNORMAL LOW (ref 3.5–5.0)
Alkaline Phosphatase: 69 U/L (ref 38–126)
Anion gap: 10 (ref 5–15)
BUN: 14 mg/dL (ref 8–23)
CO2: 19 mmol/L — ABNORMAL LOW (ref 22–32)
Calcium: 7.8 mg/dL — ABNORMAL LOW (ref 8.9–10.3)
Chloride: 111 mmol/L (ref 98–111)
Creatinine, Ser: 0.66 mg/dL (ref 0.61–1.24)
GFR, Estimated: >60 mL/min (ref >60)
Glucose, Bld: 95 mg/dL (ref 70–99)
Potassium: 3.5 mmol/L (ref 3.5–5.1)
Sodium: 140 mmol/L (ref 135–145)
Total Bilirubin: 0.3 mg/dL (ref 0.3–1.2)
Total Protein: 6.5 g/dL (ref 6.5–8.1)

## 2022-07-18 LAB — CBC WITH DIFFERENTIAL/PLATELET
Abs Immature Granulocytes: 0.03 10*3/uL (ref 0.00–0.07)
Basophils Absolute: 0 10*3/uL (ref 0.0–0.1)
Basophils Relative: 0 %
Eosinophils Absolute: 0.1 10*3/uL (ref 0.0–0.5)
Eosinophils Relative: 2 %
HCT: 30 % — ABNORMAL LOW (ref 39.0–52.0)
Hemoglobin: 9.8 g/dL — ABNORMAL LOW (ref 13.0–17.0)
Immature Granulocytes: 0 %
Lymphocytes Relative: 24 %
Lymphs Abs: 1.6 10*3/uL (ref 0.7–4.0)
MCH: 35.1 pg — ABNORMAL HIGH (ref 26.0–34.0)
MCHC: 32.7 g/dL (ref 30.0–36.0)
MCV: 107.5 fL — ABNORMAL HIGH (ref 80.0–100.0)
Monocytes Absolute: 0.7 10*3/uL (ref 0.1–1.0)
Monocytes Relative: 10 %
Neutro Abs: 4.3 10*3/uL (ref 1.7–7.7)
Neutrophils Relative %: 64 %
Platelets: 230 10*3/uL (ref 150–400)
RBC: 2.79 MIL/uL — ABNORMAL LOW (ref 4.22–5.81)
RDW: 19.2 % — ABNORMAL HIGH (ref 11.5–15.5)
WBC: 6.8 10*3/uL (ref 4.0–10.5)
nRBC: 0 % (ref 0.0–0.2)

## 2022-07-18 LAB — ETHANOL: Alcohol, Ethyl (B): 195 mg/dL — ABNORMAL HIGH (ref <10)

## 2022-07-18 LAB — ACETAMINOPHEN LEVEL: Acetaminophen (Tylenol), Serum: 24 ug/mL (ref 10–30)

## 2022-07-18 LAB — SALICYLATE LEVEL: Salicylate Lvl: 16.3 mg/dL (ref 7.0–30.0)

## 2022-07-18 LAB — AMMONIA: Ammonia: 19 umol/L (ref 9–35)

## 2022-07-18 LAB — CBG MONITORING, ED: Glucose-Capillary: 89 mg/dL (ref 70–99)

## 2022-07-18 MED ORDER — LORAZEPAM 2 MG/ML IJ SOLN: 1.0000 mg | Freq: Once | INTRAMUSCULAR | Status: DC

## 2022-07-18 MED ORDER — SODIUM CHLORIDE 0.9 % IV BOLUS
1000.0000 mL | Freq: Once | INTRAVENOUS | Status: AC
  Administered 2022-07-19: 1000 mL via INTRAVENOUS

## 2022-07-18 MED ORDER — THIAMINE HCL 100 MG/ML IJ SOLN: 100.0000 mg | Freq: Every day | INTRAMUSCULAR | Status: DC

## 2022-07-18 NOTE — ED Notes (Signed)
Pt. In gown. Pt. Has 2 belongings bag. Pt. Belonging locked up in cabinet 9-12 behind nurses station.

## 2022-07-18 NOTE — ED Provider Triage Note (Cosign Needed)
Emergency Medicine Provider Triage Evaluation Note  Korie A Brandvold , a 66 y.o. male  was evaluated in triage.  Pt complains of altered mental status.  He is unable to really tell us why he is here.  He has a history of alcoholism.  Patient is unable to give [place] any kind of logical story  Review of Systems  Positive: confusion Negative:   Physical Exam  BP 126/87 (BP Location: Right Arm)   Pulse 92   Temp 97.7 F (36.5 C) (Oral)   Resp 16   SpO2 100%  Gen:   Awake, no distress   Resp:  Normal effort  MSK:   Moves extremities without difficulty  Other:    Medical Decision Making  Medically screening exam initiated at 9:46 PM.  Appropriate orders placed.  Cotton A President was informed that the remainder of the evaluation will be completed by another provider, this initial triage assessment does not replace that evaluation, and the importance of remaining in the ED until their evaluation is complete.     Margarita Mail, PA-C 07/18/22 2149

## 2022-07-18 NOTE — ED Notes (Signed)
Pt. Refused to get into gown and vital sign. Pt. Stated, "we already have his vital signs,look out there and you can see them."

## 2022-07-18 NOTE — ED Triage Notes (Signed)
Pt seems very confused and is unable to answer questions properly. Pt smells of alcohol and is unable to stay on topic during conversation.

## 2022-07-18 NOTE — ED Provider Notes (Incomplete)
[facility] DEPT Provider Note  CSN: [id number] Arrival date & time: 07/18/22 2038  Chief Complaint(s) Medical Clearance and Altered Mental Status  HPI Anthony [name] is a 66 y.o. male {Add pertinent medical, surgical, social history, OB history to HPI:1}    Altered Mental Status   Past Medical History Past Medical History:  Diagnosis Date   Actinic keratosis 11/27/2016   Alcohol abuse with alcohol-induced mood disorder ([place]) 07/18/2018   Alcohol use disorder, severe, dependence ([place]) 09/16/2006   05/22/2015 Argumentative, belligerent and verbally abusive to staff per his note from [facility])    Anxiety state 04/25/2018   Chronic low back pain 09/16/2006   Followed by NS.  Per his chart from [place], he fell off two storyhouse roof while cleaning his brother's gutters and sustained compression fracture of L3, L4 and L5 and fracture of right transverse process at L3 and nondisplaced fracture of left glenoid rim many years ago. He had multiple imaging in [place] including Lumbar MRI in 2016 which showed moderate to severe spinal canal stenos   Delirium tremens ([place]) 09/01/2017   Depression    Dry eye 11/23/2016   Foreign body in middle portion of esophagus 05/19/2022   Hiatal hernia 09/14/2016   Status Collis gastroplasty and Nissen's fundoplication on 56/38/7564 in [place]   History of delirium tremons 07/22/2020   DTs during 10/2016 08/2017. And 12/2017 admissions    History of pulmonary embolus (PE) 11/02/2016   Unprovoked 11/01/2016. Xarelto from 11/01/16 to 09/08/2017.   Hydrocele    Surgically corrected   Hypertension    Hypoalbuminemia due to protein-calorie malnutrition ([place]) 07/22/2020   Lumbar compression fracture (HCC)    Multiple rib fractures 07/22/2019   Left rib details XR: left ribs demonstrate multiple remote rib   Pancytopenia ([place])    Rhabdomyolysis 07/22/2020   Skin cancer    exciced 2017    Tinea versicolor 06/08/2013   Tobacco use disorder 07/22/2020   Tooth infection 04/25/2018   Trigger finger, acquired 11/18/2012   Ulnar tunnel syndrome of right wrist 11/08/2012   Patient Active Problem List   Diagnosis Date Noted   Hyponatremia 06/01/2022   Non compliance w medication regimen 05/31/2022   Delirium 05/25/2022   Stricture and stenosis of esophagus 05/21/2022   Gastritis and gastroduodenitis 05/21/2022   Loose stools 05/20/2022   Malnutrition of moderate degree 05/20/2022   Acute on chronic congestive heart failure ([place])    Leukocytosis 05/19/2022   Sepsis ([place]) 05/19/2022   Cellulitis of left leg, possible 05/19/2022    Class: Question of   Moniliasis, interdigital 05/19/2022   Open toe wound, right foot, medial fourth digit 05/19/2022   Malnutrition ([place]) 05/19/2022   Alcohol abuse 03/24/2022   Dyslipidemia 03/24/2022   Acute on chronic diastolic CHF (congestive heart failure) ([place]) 03/24/2022   Acute respiratory failure with hypoxia (HCC) 03/23/2022   Generalized weakness    Atrial fibrillation with RVR ([place]) 03/15/2022   Hypokalemia 03/15/2022   Iron deficiency anemia 10/16/2020   Cough    Hypomagnesemia    Protein-calorie malnutrition, severe 08/22/2020   Alcohol use disorder, severe, in controlled environment ([address])    Pressure injury of skin 08/18/2020   Urinary tract infection without hematuria    Severe sepsis ([place]) 08/14/2020   Paroxysmal atrial fibrillation ([place]) 08/13/2020   Rhabdomyolysis 07/22/2020   Cerebral ventriculomegaly 07/22/2020   Hemoglobin decreased 07/22/2020   History of delirium tremons 07/22/2020   Hypoalbuminemia due to protein-calorie malnutrition (  [place]) 07/22/2020   Tobacco use disorder 07/22/2020   Alcohol withdrawal delirium ([place])    Dehydration    Alcohol abuse with alcohol-induced mood disorder ([place]) 07/18/2018   Depression 05/26/2018   Anxiety state 04/25/2018   Need for immunization against influenza 04/25/2018    Alcohol withdrawal ([place]) 09/01/2017   DOE (dyspnea on exertion) 11/27/2016   Actinic keratosis 11/27/2016   Macrocytic anemia 11/23/2016   History of pulmonary embolus (PE) 11/02/2016   Hiatal hernia 09/14/2016   Skin cancer 08/13/2016   Poor social situation 06/09/2013   Tinea versicolor 06/08/2013   Back pain 05/07/2013   Seborrheic dermatitis of scalp 11/08/2012   HTN (hypertension) 04/11/2012   Alcohol use disorder, severe, dependence ([place]) 09/16/2006   Home Medication(s) Prior to Admission medications   Medication Sig Start Date End Date Taking? Authorizing Provider  amiodarone (PACERONE) 200 MG tablet TAKE 1 TABLET (200 MG TOTAL) BY MOUTH DAILY. Patient taking differently: Take 200 mg by mouth daily. 03/25/22 03/25/23  Geradine Girt, DO  ascorbic acid (VITAMIN C) 500 MG tablet Take 500 mg by mouth daily as needed (for supplementation).    [provider]  B Complex-Folic Acid (B COMPLEX PLUS) TABS Take 1 tablet by mouth daily as needed (for supplementation).    [provider]  CALCIUM PO Take 1 tablet by mouth 2 (two) times a week.    [provider]  Cholecalciferol (VITAMIN D3 PO) Take 1 capsule by mouth daily as needed (for supplementation).    [provider]  diclofenac Sodium (VOLTAREN) 1 % GEL Apply 2 g topically 2 (two) times daily as needed (for pain). 06/02/22   Arlyce Dice, MD  ferrous sulfate 325 (65 FE) MG tablet Take 1 tablet (325 mg total) by mouth daily with breakfast. 06/03/22   Arlyce Dice, MD  folic acid (FOLVITE) 1 MG tablet Take 1 tablet (1 mg total) by mouth daily. 03/20/22   Sharion Settler, DO  loratadine (CLARITIN) 10 MG tablet TAKE 1 TABLET (10 MG TOTAL) BY MOUTH DAILY. Patient taking differently: Take 10 mg by mouth daily. 10/08/20 03/23/22  Lilland, Alana, DO  methocarbamol (ROBAXIN) 750 MG tablet Take 1 tablet (750 mg total) by mouth every 8 (eight) hours as needed for muscle spasms. 06/02/22   Arlyce Dice, MD   metoprolol succinate (TOPROL-XL) 25 MG 24 hr tablet Take 0.5 tablets (12.5 mg total) by mouth daily. 06/03/22   Arlyce Dice, MD  mometasone-formoterol (DULERA) 100-5 MCG/ACT AERO INHALE 2 PUFFS INTO THE LUNGS TWO TIMES DAILY. 03/19/22 03/19/23  Sharion Settler, DO  Multiple Vitamin (MULTIVITAMIN WITH MINERALS) TABS tablet Take 1 tablet by mouth daily. 06/03/22   Arlyce Dice, MD  Nystatin (GERHARDT'S BUTT CREAM) CREA Apply 1 Application topically as needed for irritation. 06/02/22   Arlyce Dice, MD  nystatin (MYCOSTATIN/NYSTOP) powder Apply topically as needed (irritation). 06/02/22   Arlyce Dice, MD  rosuvastatin (CRESTOR) 10 MG tablet Take 1 tablet (10 mg total) by mouth daily. 06/03/22   Arlyce Dice, MD  sertraline (ZOLOFT) 50 MG tablet Take 50 mg by mouth daily.    [provider]  thiamine (VITAMIN B1) 100 MG tablet Take 1 tablet (100 mg total) by mouth daily. 03/20/22   Sharion Settler, DO  Allergies Other and Poison ivy extract  Review of Systems Review of Systems As noted in HPI  Physical Exam Vital Signs  I have reviewed the triage vital signs BP 124/87 (BP Location: Right Arm)   Pulse 86   Temp 97.8 F (36.6 C) (Oral)   Resp 16   Ht '5\' 10"'$  (1.778 m)   Wt 63.5 kg   SpO2 99%   BMI 20.09 kg/m  *** Physical Exam  ED Results and Treatments Labs (all labs ordered are listed, but only abnormal results are displayed) Labs Reviewed  CBC WITH DIFFERENTIAL/PLATELET - Abnormal; Notable for the following components:      Result Value   RBC 2.79 (*)    Hemoglobin 9.8 (*)    HCT 30.0 (*)    MCV 107.5 (*)    MCH 35.1 (*)    RDW 19.2 (*)    All other components within normal limits  COMPREHENSIVE METABOLIC PANEL  URINALYSIS, ROUTINE W REFLEX MICROSCOPIC  AMMONIA  ETHANOL  RAPID URINE DRUG SCREEN, HOSP PERFORMED  VITAMIN B1   RAPID URINE DRUG SCREEN, HOSP PERFORMED  CBC WITH DIFFERENTIAL/PLATELET  SALICYLATE LEVEL  ACETAMINOPHEN LEVEL  CBG MONITORING, ED                                                                                                                         EKG  EKG Interpretation  Date/Time:    Ventricular Rate:    PR Interval:    QRS Duration:   QT Interval:    QTC Calculation:   R Axis:     Text Interpretation:         Radiology No results found.  Medications Ordered in ED Medications  thiamine (VITAMIN B1) injection 100 mg (has no administration in time range)  LORazepam (ATIVAN) injection 1 mg (has no administration in time range)  sodium chloride 0.9 % bolus 1,000 mL (has no administration in time range)                                                                                                                                     Procedures Procedures  (including critical care time)  Medical Decision Making / ED Course   Medical Decision Making Amount and/or Complexity of Data Reviewed Labs: ordered. Radiology: ordered.  Risk Prescription drug management.          Final Clinical Impression(s) /  ED Diagnoses Final diagnoses:  None    {Document critical care time when appropriate:1}  {Document review of labs and clinical decision tools ie heart score, Chads2Vasc2 etc:1}  {Document your independent review of radiology images, and any outside records:1} {Document your discussion with family members, caretakers, and with consultants:1} {Document social determinants of health affecting pt's care:1} {Document your decision making why or why not admission, treatments were needed:1} This chart was dictated using voice recognition software.  Despite best efforts to proofread,  errors can occur which can change the documentation meaning.

## 2022-07-19 ENCOUNTER — Other Ambulatory Visit: Payer: Self-pay

## 2022-07-19 ENCOUNTER — Emergency Department (HOSPITAL_COMMUNITY): Payer: 59

## 2022-07-19 ENCOUNTER — Ambulatory Visit (HOSPITAL_COMMUNITY)
Admission: EM | Admit: 2022-07-19 | Discharge: 2022-07-19 | Disposition: A | Discharge status: Home / Self Care | Payer: 59 | Attending: Psychiatry | Admitting: Psychiatry

## 2022-07-19 ENCOUNTER — Emergency Department (HOSPITAL_COMMUNITY)
Admission: EM | Admit: 2022-07-19 | Discharge: 2022-07-19 | Disposition: A | Discharge status: Home / Self Care | Payer: 59 | Source: Home / Self Care | Attending: Emergency Medicine | Admitting: Emergency Medicine

## 2022-07-19 DIAGNOSIS — F10920 Alcohol use, unspecified with intoxication, uncomplicated: Secondary | ICD-10-CM | POA: Diagnosis not present

## 2022-07-19 DIAGNOSIS — F1010 Alcohol abuse, uncomplicated: Secondary | ICD-10-CM

## 2022-07-19 DIAGNOSIS — R0602 Shortness of breath: Secondary | ICD-10-CM | POA: Insufficient documentation

## 2022-07-19 LAB — URINALYSIS, ROUTINE W REFLEX MICROSCOPIC
Bilirubin Urine: NEGATIVE
Glucose, UA: NEGATIVE mg/dL
Hgb urine dipstick: NEGATIVE
Ketones, ur: NEGATIVE mg/dL
Leukocytes,Ua: NEGATIVE
Nitrite: NEGATIVE
Protein, ur: NEGATIVE mg/dL
Specific Gravity, Urine: 1.019 (ref 1.005–1.030)
pH: 5 (ref 5.0–8.0)

## 2022-07-19 LAB — RAPID URINE DRUG SCREEN, HOSP PERFORMED
Amphetamines: NOT DETECTED
Barbiturates: NOT DETECTED
Benzodiazepines: NOT DETECTED
Cocaine: POSITIVE — AB
Opiates: NOT DETECTED
Tetrahydrocannabinol: NOT DETECTED

## 2022-07-19 MED ORDER — ACETAMINOPHEN 500 MG PO TABS
1000.0000 mg | ORAL_TABLET | Freq: Once | ORAL | Status: AC
  Administered 2022-07-19: 1000 mg via ORAL
  Filled 2022-07-19: qty 2

## 2022-07-19 NOTE — ED Notes (Signed)
Patient ambulated out in hallway.

## 2022-07-19 NOTE — ED Notes (Signed)
Pt has a box of Icehouse beer with a few missing, and a plastic cup placed in belongings bag in the 9-12 cabinet

## 2022-07-19 NOTE — H&P (Signed)
Behavioral Health Medical Screening Exam  Anthony [name] is a 66 y.o. male.  Anthony [name] is a 66 yo Caucasian male who initially presented to the [facility] on 07/19/22 with complaints of shortness of breath. Pt was also noted to be confused at that time related to alcohol abuse. Pt was medically cleared today, and walked into the [facility] with a chief complaint of:  "I am having trouble with dreams and reality." Pt  denies any past mental health diagnoses, but as per chart review, has a history of alcoholism & anxiety. Medical history is significant for A-Fib.  Mental Health assessment:   Anthony [name] is seen face to face by this provider, consulted with Dr. Dwyane Dee; and chart reviewed on 07/19/2022. During evaluation, pt is seated upright in no acute distress.  He is alert/oriented x 4; calm/cooperative; and mood congruent with affect.  He is speaking in a clear tone at moderate volume, and normal pace; with good eye contact.  His thought process is coherent and relevant; There is no indication that he is currently responding to internal/external stimuli or experiencing delusional thought content; and he has denied suicidal/self-harm/homicidal ideation, psychosis, and paranoia.  Patient has remained calm throughout assessment and has answered questions appropriately.     Pt reports trouble breathing, but oxygen sat is 97% on RA. He reports that this is a chronic condition related to his A-Fib. He reports that he has insurance coverage, but has not been following up medically with his medical problems. He denies having a mental health diagnosis even though chart review lists conditions as above. He reports that he is not on any mental health medications, denies SI currently, denies any suicide attempts in the past. He denies AVH. He states: "I thought the only way to get into this behavioral health center is to go drink a couple of beers and come in for detox." He reports that the typically drinks "a couple of beers  daily", states that he is not interested in quitting alcohol use altogether, but wants to be admitted for detox, so that he can have shelter since he is currently homeless. He states that he is "a functional alcoholic", and denies any other substance use, but as per chart review, he also uses cocaine. He reports that he has no worked in 15-20 yrs, and has no support system, and has "three crushed vertebrae" which renders him to be in pain all the time. Pt educated that he can go to the [facility] center since they now offer shelter round the clock. Pt provided with bus passes to go there, and educated on the need to follow the recommendations of the ED provider at [facility] and go to the [facility] health and wellness center within the week for assistance with his medical needs. Pt's stressors are socioeconomic in nature at this time, and he does not currently meet criteria for admission to [facility].  At this time Anthony [name] is educated and verbalizes understanding of mental health resources and other crisis services in the community. He is instructed to call 911 or [phone] and/ or present to the nearest emergency room should he experience any suicidal, homicidal ideation, auditory/visual/hallucinations, or detrimental worsening of his mental health condition.  He was also advised by Probation officer that he could call the toll-free phone on insurance card for assistance with identifying in network counselors and agencies or number on back of Medicaid card to speak with care coordinator.   Total Time spent with patient: 28  minutes  Psychiatric Specialty Exam:  Presentation  General Appearance: Disheveled  Eye Contact:Good  Speech:Clear and Coherent  Speech Volume:Normal  Handedness:Right   Mood and Affect  Mood:Euthymic  Affect:Congruent   Thought Process  Thought Processes:Coherent  Descriptions of Associations:Intact  Orientation:Full (Time, Place and Person)  Thought  Content:Logical  History of Schizophrenia/Schizoaffective disorder:No data recorded Duration of Psychotic Symptoms:No data recorded Hallucinations:Hallucinations: None  Ideas of Reference:None  Suicidal Thoughts:Suicidal Thoughts: No  Homicidal Thoughts:Homicidal Thoughts: No   Sensorium  Memory:Immediate Good  Judgment:Good  Insight:Good   Executive Functions  Concentration:Good  Attention Span:Good  [place] of Knowledge:Good  Language:Good   Psychomotor Activity  Psychomotor Activity:Psychomotor Activity: Decreased (ambulates with a cane)   Assets  Assets:Communication Skills; Resilience   Sleep  Sleep:Sleep: Good    Physical Exam: Physical Exam Constitutional:      Appearance: He is ill-appearing.  HENT:     Head: Normocephalic.     Nose: Nose normal.  Eyes:     Pupils: Pupils are equal, round, and reactive to light.  Musculoskeletal:     Cervical back: Normal range of motion.  Skin:    Coloration: Skin is pale.  Neurological:     Mental Status: He is oriented to person, place, and time.  Psychiatric:        Mood and Affect: Mood normal.        Behavior: Behavior normal.        Thought Content: Thought content normal.    Review of Systems  Constitutional: Negative.   HENT: Negative.    Eyes: Negative.   Respiratory: Negative.    Gastrointestinal: Negative.   Genitourinary: Negative.   Musculoskeletal:  Positive for back pain (chronic back pain, states takes Tylenol for it and that it is helpful).  Neurological: Negative.   Psychiatric/Behavioral:  Positive for substance abuse. Negative for depression, hallucinations, memory loss and suicidal ideas. The patient is not nervous/anxious and does not have insomnia.    Blood pressure (!) 115/90, pulse 97, temperature (!) 97.5 F (36.4 C), temperature source Oral, resp. rate 18, SpO2 97 %. There is no height or weight on file to calculate BMI.  Musculoskeletal: Strength & Muscle  Tone: within normal limits Gait & Station: normal Patient leans: N/A  [place] Scale:  Flowsheet Row ED from 07/19/2022 in [place] DEPT ED from 07/18/2022 in [place] DEPT ED from 07/16/2022 in [place] No Risk No Risk No Risk      Recommendations: -Given Bus passes to go to the [facility] for assistance with shelter and other resources. -Educated to f/u with medical problems by going to the [facility] within the week for assistance.  Based on my evaluation the patient does not appear to have an emergency medical condition.  Nicholes Rough, NP 07/19/2022, 5:47 PM

## 2022-07-19 NOTE — ED Notes (Signed)
Patient given back two bags of patient belongings and a cane. Encouraged to get changed. Patient currently refusing. Will re-check.

## 2022-07-19 NOTE — ED Notes (Signed)
Pt refuses to be discharged. Pt said "you can just call the police, 'cause I'm not leaving". Security now at bedside, waiting on [employer]

## 2022-07-19 NOTE — Discharge Instructions (Signed)
Your exam today is reassuring.  You had blood work done earlier today which was reassuring and showed no source of emergent cause of your shortness of breath.  He also recently had chest x-ray done which showed no evidence of pneumonia, fluid on the lungs or other concerning findings.  Your EKG that was done last night showed no evidence of heart problems including A-fib.  I believe the best thing for you will be to establish a primary care provider.  I have given you information for [facility].  Please call their office tomorrow morning if they are open otherwise Tuesday morning to schedule an appointment.  Please return to the emergency room for any worsening in your symptoms.

## 2022-07-19 NOTE — ED Notes (Signed)
Patient in the process of getting dressed. Was given breakfast sandwich and some coffee.

## 2022-07-19 NOTE — ED Notes (Signed)
Pt ambulated to restroom, was provided with a specimen cup and instructed to give a urine sample. Pt walked out of the restroom- when asked about sample, pt states " I dont remember any cup, and I dont remember you asking me- that's why I need to go to behavioral health"

## 2022-07-19 NOTE — ED Provider Notes (Signed)
[facility] DEPT Provider Note   CSN: [id number] Arrival date & time: 07/19/22  1228     History  Chief Complaint  Patient presents with   Medical Clearance    Anthony [name] is a 65 y.o. male.  66 year old male with past medical history significant for A-fib, polysubstance abuse who presents today for evaluation of shortness of breath which he states has been going on for quite some time.  When asked to quantify this he states it has been going on for several months.  He states he has had multiple ED visits for the same however he has not received any diagnosis and his symptoms have not improved.  He denies chest pain, palpitations, lightheadedness.  Initially when he arrived into the emergency department he was requesting to speak to behavioral health and wanting to talk to the lawyers.  He would not give a reason for why he came into the emergency department.  During my interview he denies any request to speak to [facility].  Has SI, HI.  He is alert and oriented x 4.  His main concern appears to be the inadequate treatment he receives when he comes into the emergency department.  He does not have any conversational dyspnea.  He does state that he has insurance but does not currently have a primary care provider set up.  The history is provided by the patient. No language interpreter was used.       Home Medications Prior to Admission medications   Medication Sig Start Date End Date Taking? Authorizing Provider  amiodarone (PACERONE) 200 MG tablet TAKE 1 TABLET (200 MG TOTAL) BY MOUTH DAILY. Patient taking differently: Take 200 mg by mouth daily. 03/25/22 03/25/23  Geradine Girt, DO  ascorbic acid (VITAMIN C) 500 MG tablet Take 500 mg by mouth daily as needed (for supplementation).    [provider]  B Complex-Folic Acid (B COMPLEX PLUS) TABS Take 1 tablet by mouth daily as needed (for supplementation).    [provider]  CALCIUM PO Take 1  tablet by mouth 2 (two) times a week.    [provider]  Cholecalciferol (VITAMIN D3 PO) Take 1 capsule by mouth daily as needed (for supplementation).    [provider]  diclofenac Sodium (VOLTAREN) 1 % GEL Apply 2 g topically 2 (two) times daily as needed (for pain). 06/02/22   Arlyce Dice, MD  ferrous sulfate 325 (65 FE) MG tablet Take 1 tablet (325 mg total) by mouth daily with breakfast. 06/03/22   Arlyce Dice, MD  folic acid (FOLVITE) 1 MG tablet Take 1 tablet (1 mg total) by mouth daily. 03/20/22   Sharion Settler, DO  loratadine (CLARITIN) 10 MG tablet TAKE 1 TABLET (10 MG TOTAL) BY MOUTH DAILY. Patient taking differently: Take 10 mg by mouth daily. 10/08/20 03/23/22  Lilland, Alana, DO  methocarbamol (ROBAXIN) 750 MG tablet Take 1 tablet (750 mg total) by mouth every 8 (eight) hours as needed for muscle spasms. 06/02/22   Arlyce Dice, MD  metoprolol succinate (TOPROL-XL) 25 MG 24 hr tablet Take 0.5 tablets (12.5 mg total) by mouth daily. 06/03/22   Arlyce Dice, MD  mometasone-formoterol (DULERA) 100-5 MCG/ACT AERO INHALE 2 PUFFS INTO THE LUNGS TWO TIMES DAILY. 03/19/22 03/19/23  Sharion Settler, DO  Multiple Vitamin (MULTIVITAMIN WITH MINERALS) TABS tablet Take 1 tablet by mouth daily. 06/03/22   Arlyce Dice, MD  Nystatin (GERHARDT'S BUTT CREAM) CREA Apply 1 Application topically as needed for irritation.  06/02/22   Arlyce Dice, MD  nystatin (MYCOSTATIN/NYSTOP) powder Apply topically as needed (irritation). 06/02/22   Arlyce Dice, MD  rosuvastatin (CRESTOR) 10 MG tablet Take 1 tablet (10 mg total) by mouth daily. 06/03/22   Arlyce Dice, MD  sertraline (ZOLOFT) 50 MG tablet Take 50 mg by mouth daily.    [provider]  thiamine (VITAMIN B1) 100 MG tablet Take 1 tablet (100 mg total) by mouth daily. 03/20/22   Sharion Settler, DO      Allergies    Other and Poison ivy extract    Review of Systems   Review of Systems  Constitutional:  Negative for  chills and fever.  Respiratory:  Positive for shortness of breath. Negative for wheezing.   Cardiovascular:  Negative for chest pain, palpitations and leg swelling.  Gastrointestinal:  Negative for abdominal pain.  Neurological:  Negative for syncope and light-headedness.  All other systems reviewed and are negative.   Physical Exam Updated Vital Signs BP 116/87   Pulse 93   Temp 97.6 F (36.4 C) (Oral)   Resp 18   Ht '5\' 10"'$  (1.778 m)   Wt 63.5 kg   SpO2 98%   BMI 20.09 kg/m  Physical Exam Vitals and nursing note reviewed.  Constitutional:      General: He is not in acute distress.    Appearance: Normal appearance. He is not ill-appearing.  HENT:     Head: Normocephalic and atraumatic.     Nose: Nose normal.  Eyes:     General: No scleral icterus.    Extraocular Movements: Extraocular movements intact.     Conjunctiva/sclera: Conjunctivae normal.  Cardiovascular:     Rate and Rhythm: Normal rate and regular rhythm.     Pulses: Normal pulses.  Pulmonary:     Effort: Pulmonary effort is normal. No respiratory distress.     Breath sounds: Normal breath sounds. No wheezing or rales.  Abdominal:     General: There is no distension.     Palpations: Abdomen is soft.     Tenderness: There is no abdominal tenderness. There is no guarding.  Musculoskeletal:        General: Normal range of motion.     Cervical back: Normal range of motion.     Right lower leg: No edema.     Left lower leg: No edema.  Skin:    General: Skin is warm and dry.  Neurological:     General: No focal deficit present.     Mental Status: He is alert. Mental status is at baseline.     ED Results / Procedures / Treatments   Labs (all labs ordered are listed, but only abnormal results are displayed) Labs Reviewed - No data to display  EKG None  Radiology CT Head Wo Contrast  Result Date: 07/19/2022 CLINICAL DATA:  Altered mental status. EXAM: CT HEAD WITHOUT CONTRAST TECHNIQUE: Contiguous  axial images were obtained from the base of the skull through the vertex without intravenous contrast. RADIATION DOSE REDUCTION: This exam was performed according to the departmental dose-optimization program which includes automated exposure control, adjustment of the mA and/or kV according to patient size and/or use of iterative reconstruction technique. COMPARISON:  March 15, 2022 FINDINGS: Brain: There is mild cerebral atrophy with widening of the extra-axial spaces and ventricular dilatation. There are areas of decreased attenuation within the white matter tracts of the supratentorial brain, consistent with microvascular disease changes. Vascular: No hyperdense vessel or unexpected calcification. Skull: Normal. Negative  for fracture or focal lesion. Sinuses/Orbits: There is mild bilateral ethmoid sinus and mild left maxillary sinus mucosal thickening. Moderate sized bilateral maxillary sinus air-fluid levels are also seen. Other: None. IMPRESSION: 1. No acute intracranial abnormality. 2. Generalized cerebral atrophy with widening of the extra-axial spaces and ventricular dilatation. 3. Bilateral ethmoid sinus and bilateral maxillary sinus disease. Electronically Signed   By: Virgina Norfolk M.D.   On: 07/19/2022 01:39    Procedures Procedures    Medications Ordered in ED Medications - No data to display  ED Course/ Medical Decision Making/ A&P                           Medical Decision Making  66 year old male presents today for evaluation of chronic shortness of breath.  Denies any recent change in his symptoms.  He has been seen in the emergency department multiple times mostly for alcohol intoxication most recently last night.  He was discharged after metabolized to [facility].  He expresses most of his concern regarding the treatment he receives from the medical system.  He states he now has insurance.  He states he is other styloids of the healthcare providers and he expects better treatment.   He appears in no acute distress.  No signs of volume overload.  No conversational dyspnea.  He had a chest x-ray done on 12/28 which showed no acute cardiopulmonary process.  He had an EKG done last night which showed sinus rhythm.  No A-fib.  He does have history of A-fib.  Low suspicion for acute cause of his symptoms.  Particularly since he states this has been ongoing for several months and there is been no recent change in his symptoms.  Other blood work from last night with the exception of his EtOH level were reassuring.  Patient is appropriate for discharge.  Given information for [facility] to establish care.   Final Clinical Impression(s) / ED Diagnoses Final diagnoses:  Chronic shortness of breath    Rx / DC Orders ED Discharge Orders     None         Evlyn Courier, PA-C 07/19/22 1450    Regan Lemming, MD 07/19/22 [phone]

## 2022-07-19 NOTE — ED Triage Notes (Signed)
Pt requesting writer call [facility] and let him talk to them. He states she wants to talk to the lawyers and not tell everyone his business. Pt noncompliant in answering triage question. Not sure what he wants at this time.

## 2022-07-21 ENCOUNTER — Ambulatory Visit: Payer: 59 | Admitting: Gastroenterology

## 2022-07-21 LAB — VITAMIN B1: Vitamin B1 (Thiamine): 79 nmol/L (ref 66.5–200.0)

## 2022-07-23 ENCOUNTER — Emergency Department (HOSPITAL_COMMUNITY)
Admission: EM | Admit: 2022-07-23 | Discharge: 2022-07-24 | Disposition: A | Discharge status: Home / Self Care | Payer: 59 | Attending: Emergency Medicine | Admitting: Emergency Medicine

## 2022-07-23 DIAGNOSIS — F10129 Alcohol abuse with intoxication, unspecified: Secondary | ICD-10-CM | POA: Diagnosis present

## 2022-07-23 DIAGNOSIS — F10920 Alcohol use, unspecified with intoxication, uncomplicated: Secondary | ICD-10-CM

## 2022-07-23 DIAGNOSIS — Z7982 Long term (current) use of aspirin: Secondary | ICD-10-CM | POA: Diagnosis not present

## 2022-07-23 DIAGNOSIS — R4781 Slurred speech: Secondary | ICD-10-CM | POA: Insufficient documentation

## 2022-07-23 DIAGNOSIS — Z79899 Other long term (current) drug therapy: Secondary | ICD-10-CM | POA: Insufficient documentation

## 2022-07-23 DIAGNOSIS — K5900 Constipation, unspecified: Secondary | ICD-10-CM | POA: Diagnosis not present

## 2022-07-23 DIAGNOSIS — Y908 Blood alcohol level of 240 mg/100 ml or more: Secondary | ICD-10-CM | POA: Insufficient documentation

## 2022-07-23 LAB — CBC WITH DIFFERENTIAL/PLATELET
Abs Immature Granulocytes: 0.01 10*3/uL (ref 0.00–0.07)
Basophils Absolute: 0.1 10*3/uL (ref 0.0–0.1)
Basophils Relative: 1 %
Eosinophils Absolute: 0.1 10*3/uL (ref 0.0–0.5)
Eosinophils Relative: 2 %
HCT: 36.1 % — ABNORMAL LOW (ref 39.0–52.0)
Hemoglobin: 11.8 g/dL — ABNORMAL LOW (ref 13.0–17.0)
Immature Granulocytes: 0 %
Lymphocytes Relative: 47 %
Lymphs Abs: 2.1 10*3/uL (ref 0.7–4.0)
MCH: 35.2 pg — ABNORMAL HIGH (ref 26.0–34.0)
MCHC: 32.7 g/dL (ref 30.0–36.0)
MCV: 107.8 fL — ABNORMAL HIGH (ref 80.0–100.0)
Monocytes Absolute: 0.3 10*3/uL (ref 0.1–1.0)
Monocytes Relative: 7 %
Neutro Abs: 2 10*3/uL (ref 1.7–7.7)
Neutrophils Relative %: 43 %
Platelets: 233 10*3/uL (ref 150–400)
RBC: 3.35 MIL/uL — ABNORMAL LOW (ref 4.22–5.81)
RDW: 19.9 % — ABNORMAL HIGH (ref 11.5–15.5)
WBC: 4.5 10*3/uL (ref 4.0–10.5)
nRBC: 0 % (ref 0.0–0.2)

## 2022-07-23 LAB — COMPREHENSIVE METABOLIC PANEL
ALT: 9 U/L (ref 0–44)
AST: 29 U/L (ref 15–41)
Albumin: 2.4 g/dL — ABNORMAL LOW (ref 3.5–5.0)
Alkaline Phosphatase: 60 U/L (ref 38–126)
Anion gap: 10 (ref 5–15)
BUN: 8 mg/dL (ref 8–23)
CO2: 20 mmol/L — ABNORMAL LOW (ref 22–32)
Calcium: 7.5 mg/dL — ABNORMAL LOW (ref 8.9–10.3)
Chloride: 114 mmol/L — ABNORMAL HIGH (ref 98–111)
Creatinine, Ser: 0.58 mg/dL — ABNORMAL LOW (ref 0.61–1.24)
GFR, Estimated: >60 mL/min (ref >60)
Glucose, Bld: 103 mg/dL — ABNORMAL HIGH (ref 70–99)
Potassium: 3.9 mmol/L (ref 3.5–5.1)
Sodium: 144 mmol/L (ref 135–145)
Total Bilirubin: 0.6 mg/dL (ref 0.3–1.2)
Total Protein: 6.4 g/dL — ABNORMAL LOW (ref 6.5–8.1)

## 2022-07-23 LAB — ETHANOL: Alcohol, Ethyl (B): 391 mg/dL (ref <10)

## 2022-07-23 MED ORDER — THIAMINE MONONITRATE 100 MG PO TABS
100.0000 mg | ORAL_TABLET | Freq: Every day | ORAL | Status: DC
  Administered 2022-07-23: 100 mg via ORAL
  Filled 2022-07-23: qty 1

## 2022-07-23 MED ORDER — LORAZEPAM 1 MG PO TABS: 2.0000 mg | ORAL_TABLET | Freq: Two times a day (BID) | ORAL | Status: DC

## 2022-07-23 MED ORDER — THIAMINE HCL 100 MG/ML IJ SOLN: 100.0000 mg | Freq: Every day | INTRAMUSCULAR | Status: DC

## 2022-07-23 MED ORDER — SODIUM CHLORIDE 0.9 % IV BOLUS
1000.0000 mL | Freq: Once | INTRAVENOUS | Status: AC
  Administered 2022-07-23: 1000 mL via INTRAVENOUS

## 2022-07-23 MED ORDER — LORAZEPAM 1 MG PO TABS
2.0000 mg | ORAL_TABLET | Freq: Four times a day (QID) | ORAL | Status: DC
  Administered 2022-07-23: 2 mg via ORAL
  Filled 2022-07-23: qty 2

## 2022-07-23 MED ORDER — LORAZEPAM 1 MG PO TABS: 0.0000 mg | ORAL_TABLET | Freq: Four times a day (QID) | ORAL | Status: DC

## 2022-07-23 NOTE — Discharge Instructions (Addendum)
You were seen in the emergency department today and found to be intoxicated.  Your labs look similar to prior with anemia, low protein levels, low calcium levels, please have these followed by your doctor in the primary care setting.  Please try to steadily decrease your alcohol intake.  Please follow-up with attached resources.  Return to the ED for new or worsening symptoms or any other concerns.

## 2022-07-23 NOTE — ED Triage Notes (Signed)
Pt BIBA, [facility], thought to be there more than a day. Found with 2 half empty liquor bottles. Reports [place] food intake last few days. Hands very cold to touch.  98/60 HR 90 CBG 84

## 2022-07-23 NOTE — ED Provider Notes (Signed)
[facility] DEPT Provider Note   CSN: [id number] Arrival date & time: 07/23/22  1355     History  Chief Complaint  Patient presents with   Alcohol Intoxication   Cold Exposure    Anthony [name] is a 67 y.o. male with a past medical history of alcohol use disorder presenting today due to "wanting some help."  When asked what he means by this he says that I will not understand what is going on in his mind.  Denies SI/HI or AVH.  Appears intoxicated.  Says that he does not want help with his alcohol use.   Alcohol Intoxication       Home Medications Prior to Admission medications   Medication Sig Start Date End Date Taking? Authorizing Provider  amiodarone (PACERONE) 200 MG tablet TAKE 1 TABLET (200 MG TOTAL) BY MOUTH DAILY. Patient taking differently: Take 200 mg by mouth daily. 03/25/22 03/25/23  Geradine Girt, DO  ascorbic acid (VITAMIN C) 500 MG tablet Take 500 mg by mouth daily as needed (for supplementation).    [provider]  B Complex-Folic Acid (B COMPLEX PLUS) TABS Take 1 tablet by mouth daily as needed (for supplementation).    [provider]  CALCIUM PO Take 1 tablet by mouth 2 (two) times a week.    [provider]  Cholecalciferol (VITAMIN D3 PO) Take 1 capsule by mouth daily as needed (for supplementation).    [provider]  diclofenac Sodium (VOLTAREN) 1 % GEL Apply 2 g topically 2 (two) times daily as needed (for pain). 06/02/22   Arlyce Dice, MD  ferrous sulfate 325 (65 FE) MG tablet Take 1 tablet (325 mg total) by mouth daily with breakfast. 06/03/22   Arlyce Dice, MD  folic acid (FOLVITE) 1 MG tablet Take 1 tablet (1 mg total) by mouth daily. 03/20/22   Sharion Settler, DO  loratadine (CLARITIN) 10 MG tablet TAKE 1 TABLET (10 MG TOTAL) BY MOUTH DAILY. Patient taking differently: Take 10 mg by mouth daily. 10/08/20 03/23/22  Lilland, Alana, DO  methocarbamol (ROBAXIN) 750 MG tablet Take 1 tablet  (750 mg total) by mouth every 8 (eight) hours as needed for muscle spasms. 06/02/22   Arlyce Dice, MD  metoprolol succinate (TOPROL-XL) 25 MG 24 hr tablet Take 0.5 tablets (12.5 mg total) by mouth daily. 06/03/22   Arlyce Dice, MD  mometasone-formoterol (DULERA) 100-5 MCG/ACT AERO INHALE 2 PUFFS INTO THE LUNGS TWO TIMES DAILY. 03/19/22 03/19/23  Sharion Settler, DO  Multiple Vitamin (MULTIVITAMIN WITH MINERALS) TABS tablet Take 1 tablet by mouth daily. 06/03/22   Arlyce Dice, MD  Nystatin (GERHARDT'S BUTT CREAM) CREA Apply 1 Application topically as needed for irritation. 06/02/22   Arlyce Dice, MD  nystatin (MYCOSTATIN/NYSTOP) powder Apply topically as needed (irritation). 06/02/22   Arlyce Dice, MD  rosuvastatin (CRESTOR) 10 MG tablet Take 1 tablet (10 mg total) by mouth daily. 06/03/22   Arlyce Dice, MD  sertraline (ZOLOFT) 50 MG tablet Take 50 mg by mouth daily.    [provider]  thiamine (VITAMIN B1) 100 MG tablet Take 1 tablet (100 mg total) by mouth daily. 03/20/22   Sharion Settler, DO      Allergies    Other and Poison ivy extract    Review of Systems   Review of Systems  Physical Exam Updated Vital Signs BP (!) 125/94 (BP Location: Left Arm)   Pulse (!) 102   Resp 20   SpO2 100%  Physical Exam  Vitals and nursing note reviewed.  Constitutional:      General: He is not in acute distress.    Appearance: Normal appearance. He is not ill-appearing or diaphoretic.  HENT:     Head: Normocephalic and atraumatic.  Eyes:     General: No scleral icterus.    Conjunctiva/sclera: Conjunctivae normal.  Pulmonary:     Effort: Pulmonary effort is normal. No respiratory distress.  Skin:    Findings: No rash.  Neurological:     Mental Status: He is alert.     Comments: Slurred speech, favoring intoxication  Psychiatric:        Mood and Affect: Mood normal.     ED Results / Procedures / Treatments   Labs (all labs ordered are listed, but only abnormal results  are displayed) Labs Reviewed  COMPREHENSIVE METABOLIC PANEL  ETHANOL  RAPID URINE DRUG SCREEN, HOSP PERFORMED  CBC WITH DIFFERENTIAL/PLATELET    EKG None  Radiology No results found.  Procedures Procedures   Medications Ordered in ED Medications - No data to display  ED Course/ Medical Decision Making/ A&P                           Medical Decision Making Amount and/or Complexity of Data Reviewed Labs: ordered.  Risk OTC drugs. Prescription drug management.   67 year old male with a past medical history of alcohol use disorder presenting today requesting help.  He is unable to say what he needs help with.  Very clearly intoxicated.  Denies SI/HI/AVH.  Do not believe patient needs a psychiatric evaluation at this time.  More suspicious for alcohol intoxication causing his symptoms and complaints.  Will order basic labs and patient will likely need to metabolize to discharge with outpatient resources. .  All of medical clearance labs reviewed and interpreted by me.  At or better than baseline.  Ethanol 391 as expected.  9pm: Patient is arousable, slightly more alert.  Does still fall asleep during evaluation.  Has not eaten the food next to his bed despite saying that he has not had anything to eat in 3 days.  Will need to continue to metabolize.  Patient has been signed out to overnight PA, Sammy Petrucelli.  Please see her note for any further events and ultimate disposition.  I suspect patient will be discharged after he is clinically sober and able to ambulate.  Final Clinical Impression(s) / ED Diagnoses Final diagnoses:  Alcoholic intoxication without complication ([place])    Rx / DC Orders ED Discharge Orders     None      Results and diagnoses were explained to the patient. Return precautions discussed in full. Patient had no additional questions and expressed complete understanding.   This chart was dictated using voice recognition software.  Despite best  efforts to proofread,  errors can occur which can change the documentation meaning.    Darliss Ridgel 07/23/22 2203    Margette Fast, MD 07/28/22 0400

## 2022-07-24 ENCOUNTER — Encounter (HOSPITAL_COMMUNITY): Payer: Self-pay

## 2022-07-24 ENCOUNTER — Emergency Department (HOSPITAL_COMMUNITY)
Admission: EM | Admit: 2022-07-24 | Discharge: 2022-07-24 | Disposition: A | Discharge status: Home / Self Care | Payer: 59 | Source: Home / Self Care | Attending: Emergency Medicine | Admitting: Emergency Medicine

## 2022-07-24 ENCOUNTER — Other Ambulatory Visit: Payer: Self-pay

## 2022-07-24 ENCOUNTER — Other Ambulatory Visit (HOSPITAL_COMMUNITY): Payer: Self-pay

## 2022-07-24 DIAGNOSIS — K5900 Constipation, unspecified: Secondary | ICD-10-CM | POA: Insufficient documentation

## 2022-07-24 DIAGNOSIS — F10129 Alcohol abuse with intoxication, unspecified: Secondary | ICD-10-CM | POA: Diagnosis not present

## 2022-07-24 DIAGNOSIS — Z7982 Long term (current) use of aspirin: Secondary | ICD-10-CM | POA: Insufficient documentation

## 2022-07-24 MED ORDER — POLYETHYLENE GLYCOL 3350 17 GM/SCOOP PO POWD
17.0000 g | Freq: Every day | ORAL | 0 refills | Status: DC
  Filled 2022-07-24: qty 238, 14d supply, fill #0

## 2022-07-24 MED ORDER — METAMUCIL 0.52 G PO CAPS
0.5200 g | ORAL_CAPSULE | Freq: Every day | ORAL | 0 refills | Status: DC
  Filled 2022-07-24: qty 30, 30d supply, fill #0

## 2022-07-24 MED ORDER — FLEET ENEMA 7-19 GM/118ML RE ENEM
1.0000 | ENEMA | Freq: Once | RECTAL | 0 refills | Status: AC
  Filled 2022-07-24: qty 133, 1d supply, fill #0

## 2022-07-24 NOTE — Discharge Instructions (Signed)
You were seen in the emergency department for your constipation.  I have given you a fiber supplement that you can take daily as well as MiraLAX which is a laxative that you can take to help with bowel movements.  You should take this daily until you are having regular soft bowel movements, then you can cut the dose in half until you are again having regular soft bowel movements and then can take as needed.  I have also given you an enema that you can use as needed.  You can follow-up with your primary doctor in the next few days to have your symptoms rechecked as well as for management of your chronic medical problems.  You should return to the emergency department for significantly worsening abdominal pain, repetitive vomiting, fevers or any other new or concerning symptoms.

## 2022-07-24 NOTE — ED Notes (Signed)
An After Visit Summary was printed and given to the patient. Discharge instructions given and no further questions at this time.  Pt has resources in his discharge papers. Pt wants to stay in his room. Pt educated that he has been medically cleared. Pt A&Ox4, ambulatory with a steady gait.

## 2022-07-24 NOTE — ED Triage Notes (Signed)
Patient was sitting in the lobby when staff arrived this AM. Patient states he was waiting for a ride.  Patient then reports that he was trying to walk across the parking lot a few minutes ago and had abdominal pain because he could not have a BM today.  Patient also c/o bilateral hand and lower extremity edema.

## 2022-07-24 NOTE — ED Provider Triage Note (Signed)
Emergency Medicine Provider Triage Evaluation Note  Westen A Kight , a 67 y.o. male  was evaluated in triage.  Pt with multiple complaints after being discharged from this facility at 5:30am this morning. Complaining that since leaving he has been unable to have a bowel movement. Also complaining of bilateral hand and lower extremity edema for "a while", and intermittent falls for 3-4 years.   Review of Systems  Positive: Abd pain, constipation, leg and hand swelling Negative:   Physical Exam  There were no vitals taken for this visit. Gen:   Awake, no distress   Resp:  Normal effort  MSK:   Moves extremities without difficulty  Other:    Medical Decision Making  Medically screening exam initiated at 11:30 AM.  Appropriate orders placed.  Adolfo A Hildebrant was informed that the remainder of the evaluation will be completed by another provider, this initial triage assessment does not replace that evaluation, and the importance of remaining in the ED until their evaluation is complete.  Numerous visits at this facility for alcohol intoxication, most recently last night, metabolized over night and discharged this morning   Xaria Judon T, PA-C 07/24/22 1132

## 2022-07-24 NOTE — ED Provider Notes (Signed)
   ED Course / MDM    Medical Decision Making Amount and/or Complexity of Data Reviewed Labs: ordered.  Risk OTC drugs. Prescription drug management.   Assumed care of patient from PA [place] at shift change pending metabolization. Please see prior provider note for full H&P.  Briefly patient is a 67 year old male with a history of alcohol use disorder as well as several other medical comorbidities who presented to the emergency department requesting help, no additional specific complaints.  Found to be intoxicated with an EtOH of 391.  The remainder of his laboratory abnormalities appear similar to prior.  Patient metabolized in the emergency department overnight, was able to sleep, subsequently woke up and was able to ambulate and tolerate p.o. without difficulty.  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  He appears reasonable for discharge.  Resources provided.       Leafy Kindle 07/24/22 2130    Quintella Reichert, MD 07/25/22 [phone]

## 2022-07-24 NOTE — ED Provider Notes (Signed)
[facility] DEPT Provider Note   CSN: [id number] Arrival date & time: 07/24/22  1022     History  Chief Complaint  Patient presents with   Abdominal Pain   Leg Swelling   hand swelling    Anthony [name] is a 67 y.o. male.  Patient is a 67 year old male with a past medical history of A-fib and alcohol use disorder presenting to the emergency department with abdominal pain and constipation.  Patient states that he was seen in the ED yesterday for his alcohol intoxication and upon leaving he had abdominal bloating.  He states that he tried to have a bowel movement only had a small amount of stool, with significant pain and straining.  He denies any black or bloody stools.  He denies any fevers or chills, nausea or vomiting.  He states that he does have frequent constipation but does not take any stool softeners regularly.  The history is provided by the patient.  Abdominal Pain      Home Medications Prior to Admission medications   Medication Sig Start Date End Date Taking? Authorizing Provider  polyethylene glycol (MIRALAX) 17 g packet Take 17 g by mouth daily. 07/24/22  Yes Maylon Peppers, [place] K, DO  psyllium (METAMUCIL) 0.52 g capsule Take 1 capsule (0.52 g total) by mouth daily. 07/24/22  Yes Maylon Peppers, [place], DO  sodium phosphate (FLEET) 7-19 GM/118ML ENEM Place 133 mLs (1 enema total) rectally once for 1 dose. 07/24/22 07/24/22 Yes Leanord Asal K, DO  acetaminophen (TYLENOL) 500 MG tablet Take 500-1,000 mg by mouth every 6 (six) hours as needed (for pain, headaches, or dental pain).    [provider]  amiodarone (PACERONE) 200 MG tablet TAKE 1 TABLET (200 MG TOTAL) BY MOUTH DAILY. Patient not taking: Reported on 07/23/2022 03/25/22 03/25/23  Geradine Girt, DO  ascorbic acid (VITAMIN C) 500 MG tablet Take 500 mg by mouth daily as needed (for supplementation). Patient not taking: Reported on 07/23/2022    [provider]  aspirin EC 325  MG tablet Take 325 mg by mouth every 6 (six) hours as needed (for pain, headaches, or dental pain).    [provider]  B Complex-Folic Acid (B COMPLEX PLUS) TABS Take 1 tablet by mouth daily as needed (for supplementation). Patient not taking: Reported on 07/23/2022    [provider]  CALCIUM PO Take 1 tablet by mouth 2 (two) times a week. Patient not taking: Reported on 07/23/2022    [provider]  Cholecalciferol (VITAMIN D3 PO) Take 1 capsule by mouth daily as needed (for supplementation). Patient not taking: Reported on 07/23/2022    [provider]  diclofenac Sodium (VOLTAREN) 1 % GEL Apply 2 g topically 2 (two) times daily as needed (for pain). Patient not taking: Reported on 07/23/2022 06/02/22   Arlyce Dice, MD  ferrous sulfate 325 (65 FE) MG tablet Take 1 tablet (325 mg total) by mouth daily with breakfast. Patient not taking: Reported on 07/23/2022 06/03/22   Arlyce Dice, MD  folic acid (FOLVITE) 1 MG tablet Take 1 tablet (1 mg total) by mouth daily. Patient not taking: Reported on 07/23/2022 03/20/22   Sharion Settler, DO  loratadine (CLARITIN) 10 MG tablet TAKE 1 TABLET (10 MG TOTAL) BY MOUTH DAILY. Patient not taking: Reported on 07/23/2022 10/08/20 07/23/22  Lilland, Lorrin Goodell, DO  methocarbamol (ROBAXIN) 750 MG tablet Take 1 tablet (750 mg total) by mouth every 8 (eight) hours as needed for muscle spasms. Patient  not taking: Reported on 07/23/2022 06/02/22   Arlyce Dice, MD  metoprolol succinate (TOPROL-XL) 25 MG 24 hr tablet Take 0.5 tablets (12.5 mg total) by mouth daily. Patient not taking: Reported on 07/23/2022 06/03/22   Arlyce Dice, MD  metoprolol tartrate (LOPRESSOR) 25 MG tablet Take by mouth. Patient not taking: Reported on 07/23/2022 06/15/22   [provider]  mometasone-formoterol (DULERA) 100-5 MCG/ACT AERO INHALE 2 PUFFS INTO THE LUNGS TWO TIMES DAILY. Patient not taking: Reported on 07/23/2022 03/19/22 03/19/23  Sharion Settler, DO   Multiple Vitamin (MULTIVITAMIN WITH MINERALS) TABS tablet Take 1 tablet by mouth daily. Patient not taking: Reported on 07/23/2022 06/03/22   Arlyce Dice, MD  Nystatin (GERHARDT'S BUTT CREAM) CREA Apply 1 Application topically as needed for irritation. Patient not taking: Reported on 07/23/2022 06/02/22   Arlyce Dice, MD  nystatin (MYCOSTATIN/NYSTOP) powder Apply topically as needed (irritation). Patient not taking: Reported on 07/23/2022 06/02/22   Arlyce Dice, MD  rosuvastatin (CRESTOR) 10 MG tablet Take 1 tablet (10 mg total) by mouth daily. Patient not taking: Reported on 07/23/2022 06/03/22   Arlyce Dice, MD  sertraline (ZOLOFT) 50 MG tablet Take 50 mg by mouth daily. Patient not taking: Reported on 07/23/2022    [provider]  thiamine (VITAMIN B1) 100 MG tablet Take 1 tablet (100 mg total) by mouth daily. Patient not taking: Reported on 07/23/2022 03/20/22   Sharion Settler, DO      Allergies    Other and Poison ivy extract    Review of Systems   Review of Systems  Gastrointestinal:  Positive for abdominal pain.    Physical Exam Updated Vital Signs BP (!) 126/93 (BP Location: Left Arm)   Pulse 90   Temp 97.9 F (36.6 C) (Oral)   Resp 18   Ht '5\' 10"'$  (1.778 m)   Wt 63.5 kg   SpO2 98%   BMI 20.09 kg/m  Physical Exam Vitals and nursing note reviewed.  Constitutional:      General: He is not in acute distress.    Appearance: He is well-developed.  HENT:     Head: Normocephalic and atraumatic.     Mouth/Throat:     Mouth: Mucous membranes are moist.     Pharynx: Oropharynx is clear.  Eyes:     Pupils: Pupils are equal, round, and reactive to light.  Cardiovascular:     Rate and Rhythm: Normal rate and regular rhythm.  Pulmonary:     Effort: Pulmonary effort is normal.     Breath sounds: Normal breath sounds.  Abdominal:     General: Abdomen is flat.     Palpations: Abdomen is soft.     Tenderness: There is no abdominal tenderness.  Skin:    General:  Skin is warm and dry.  Neurological:     General: No focal deficit present.     Mental Status: He is alert and oriented to person, place, and time.  Psychiatric:        Mood and Affect: Mood normal.        Behavior: Behavior normal.     ED Results / Procedures / Treatments   Labs (all labs ordered are listed, but only abnormal results are displayed) Labs Reviewed - No data to display  EKG None  Radiology No results found.  Procedures Procedures    Medications Ordered in ED Medications - No data to display  ED Course/ Medical Decision Making/ A&P  Medical Decision Making This patient presents to the ED with chief complaint(s) of abdominal pain, constipation with pertinent past medical history of A fib, ETOH use which further complicates the presenting complaint. The complaint involves an extensive differential diagnosis and also carries with it a high risk of complications and morbidity.    The differential diagnosis includes constipation, the patient still having normal bowel movements and passing gas making SBO unlikely, he has no fevers and a benign abdomen making an intra-abdominal infection unlikely  Additional history obtained: Additional history obtained from N/A Records reviewed recent ED records  ED Course and Reassessment: Patient is awake alert and well-appearing in no acute distress.  Exam and history consistent with constipation and he will be given stool softeners and fiber supplements as well as a enema as needed.  Of note, the patient was seen in the ED last night for his alcohol intoxication and his labs were reviewed including normal kidney and liver function without significant abnormalities from his baseline.  The patient is requesting primary care follow-up and resources for homelessness and he will be given shelter as well as alcohol rehab resources.  Independent labs interpretation:  N/A  Independent visualization of  imaging: N/A  Consultation: - Consulted or discussed management/test interpretation w/ external professional: N/A  Consideration for admission or further workup: Patient has no emergent conditions requiring admission or further work-up at this time and is stable for discharge home with primary care follow-up  Social Determinants of health: N/A            Final Clinical Impression(s) / ED Diagnoses Final diagnoses:  Constipation, unspecified constipation type    Rx / DC Orders ED Discharge Orders          Ordered    polyethylene glycol (MIRALAX) 17 g packet  Daily        07/24/22 1642    psyllium (METAMUCIL) 0.52 g capsule  Daily        07/24/22 1642    sodium phosphate (FLEET) 7-19 GM/118ML ENEM   Once        07/24/22 [place], [place], DO 07/24/22 1645

## 2022-07-24 NOTE — ED Notes (Signed)
I called patient in the lobby. Outside, and the bathroom to take back to a room and no one responded.

## 2022-07-27 ENCOUNTER — Other Ambulatory Visit (HOSPITAL_COMMUNITY): Payer: Self-pay

## 2022-08-09 DIAGNOSIS — F32A Depression, unspecified: Secondary | ICD-10-CM | POA: Insufficient documentation

## 2022-08-15 ENCOUNTER — Emergency Department (HOSPITAL_COMMUNITY)
Admission: EM | Admit: 2022-08-15 | Discharge: 2022-08-15 | Disposition: A | Discharge status: Home / Self Care | Payer: 59 | Attending: Student | Admitting: Student

## 2022-08-15 ENCOUNTER — Other Ambulatory Visit: Payer: Self-pay

## 2022-08-15 DIAGNOSIS — Z5900 Homelessness unspecified: Secondary | ICD-10-CM | POA: Insufficient documentation

## 2022-08-15 DIAGNOSIS — R0602 Shortness of breath: Secondary | ICD-10-CM | POA: Insufficient documentation

## 2022-08-15 NOTE — ED Provider Notes (Signed)
  [facility] Emergency Department Provider Note MRN:  [id number]  Arrival date & time: 08/15/22     Chief Complaint   Shortness of Breath   History of Present Illness   Anthony [name] is a 67 y.o. year-old male presents to the ED with chief complaint of homelessness.  Was picked up by EMS from bus stop near [facility].  Patient changes his story as to why he is here and ultimately, doesn't provide a history due to being belligerent and uncooperative. EMS reports SOB for months.  Denies cough or fever.  History provided by patient.   Review of Systems  Pertinent positive and negative review of systems noted in HPI.    Physical Exam   Vitals:   08/15/22 2334  BP: (!) 135/105  Pulse: 69  Resp: 16  Temp: 97.7 F (36.5 C)  SpO2: 98%    CONSTITUTIONAL:  non toxic-appearing, NAD NEURO:  Alert and oriented x 3, CN 3-12 grossly intact EYES:  eyes equal and reactive ENT/NECK:  Supple, no stridor  CARDIO: Normal rate, appears well-perfused  PULM:  No respiratory distress, no wheezing, speaking in complete sentences GI/GU:  non-distended,  MSK/SPINE:  No gross deformities, no edema, moves all extremities, ambulatory SKIN:  no rash, atraumatic   *Additional and/or pertinent findings included in MDM below  Diagnostic and Interventional Summary    EKG Interpretation  Date/Time:    Ventricular Rate:    PR Interval:    QRS Duration:   QT Interval:    QTC Calculation:   R Axis:     Text Interpretation:         Labs Reviewed - No data to display  No orders to display    Medications - No data to display   Procedures  /  Critical Care Procedures  ED Course and Medical Decision Making  I have reviewed the triage vital signs, the nursing notes, and pertinent available records from the EMR.  Social Determinants Affecting Complexity of Care: Patient is homelessness.   ED Course:    Medical Decision Making Patient here for reported  SOB.  No respiratory distress on exam.  States that he fell onto the ground, but I watched him lie down to sleep.  He did not fall.   Patient ambulates to the bathroom without difficulty.  He is belligerent and hostile with staff.  He doesn't appear to have any emergent condition.  I find him appropriately screened and stable for discharge.      Consultants: No consultations were needed in caring for this patient.   Treatment and Plan: Emergency department workup does not suggest an emergent condition requiring admission or immediate intervention beyond  what has been performed at this time. The patient is safe for discharge and has  been instructed to return immediately for worsening symptoms, change in  symptoms or any other concerns    Final Clinical Impressions(s) / ED Diagnoses     ICD-10-CM   1. Homelessness  Z59.00       ED Discharge Orders     None         Discharge Instructions Discussed with and Provided to Patient:   Discharge Instructions   None      Montine Circle, Hershal Coria 08/15/22 2338    Teressa Lower, MD 08/16/22 [phone]

## 2022-08-15 NOTE — ED Triage Notes (Signed)
Pt bib [facility] for SOB for months. No cough, no fever. Pt is NAD. Pt was picked up by EMS from the bus stop. Per EMS pt is banned from [facility] so brought here.

## 2022-08-21 ENCOUNTER — Emergency Department (HOSPITAL_COMMUNITY)
Admission: EM | Admit: 2022-08-21 | Discharge: 2022-08-21 | Disposition: A | Discharge status: Home / Self Care | Payer: 59 | Attending: Emergency Medicine | Admitting: Emergency Medicine

## 2022-08-21 ENCOUNTER — Encounter (HOSPITAL_COMMUNITY): Payer: Self-pay | Admitting: *Deleted

## 2022-08-21 ENCOUNTER — Emergency Department (HOSPITAL_COMMUNITY)
Admission: EM | Admit: 2022-08-21 | Discharge: 2022-08-21 | Disposition: A | Discharge status: Home / Self Care | Payer: 59 | Source: Home / Self Care | Attending: Emergency Medicine | Admitting: Emergency Medicine

## 2022-08-21 ENCOUNTER — Other Ambulatory Visit: Payer: Self-pay

## 2022-08-21 ENCOUNTER — Encounter (HOSPITAL_COMMUNITY): Payer: Self-pay

## 2022-08-21 DIAGNOSIS — Z5900 Homelessness unspecified: Secondary | ICD-10-CM | POA: Insufficient documentation

## 2022-08-21 DIAGNOSIS — F1090 Alcohol use, unspecified, uncomplicated: Secondary | ICD-10-CM | POA: Diagnosis present

## 2022-08-21 DIAGNOSIS — R0602 Shortness of breath: Secondary | ICD-10-CM | POA: Diagnosis not present

## 2022-08-21 DIAGNOSIS — Z7982 Long term (current) use of aspirin: Secondary | ICD-10-CM | POA: Insufficient documentation

## 2022-08-21 MED ORDER — ALBUTEROL SULFATE HFA 108 (90 BASE) MCG/ACT IN AERS
2.0000 | INHALATION_SPRAY | Freq: Once | RESPIRATORY_TRACT | Status: AC
  Administered 2022-08-21: 2 via RESPIRATORY_TRACT
  Filled 2022-08-21: qty 6.7

## 2022-08-21 NOTE — Discharge Instructions (Signed)
Substance Abuse Treatment Programs ° °Intensive Outpatient Programs °[facility]     °[address]      °[place], [place]                   °[phone]      ° °[facility] °[address] °[place], [place] °[phone] ° °[facility]     °(Inpatient and outpatient)     °[address]           °[phone]   ° °[facility] °[phone] (Suboxone and Methadone) ° °[address]      °[place], [place] [address]      °[phone]      ° °[address] °[place], [place] °[phone] ° °[facility] (Outpatient/Inpatient, Chemical)    °(insurance only) [phone]      °       °[facility] (Groups & Residential) °[place], [place] °[phone] ° °   °[facility]     °[address]     °[place], [place]      °[phone]      ° °[facility] (for caregivers and family) °[address] °[place], [place] °336-299-4655 ° ° ° ° ° °Residential Treatment Programs °[facility]      °[address], [place], [place] [address]  °[phone]      ° °[facility] °[address] [place], [place] [address] °[phone] ° °[facility]        °[phone]      ° °[facility] °[phone] ° °[facility]             °[address]                                         °[place], [place]                                                °[phone] or [phone]                              ° °[facility] [place] °[address] °[place], [address] °[phone] ° °[facility]    °[address]      °[place], [place]     °[phone]      ° °[facility] °[address] °[place], [place] °[phone] ° °[facility]   °[address]     °[place], [place] [address]     °[phone]      °Admissions: 8am-3pm M-F ° °[facility] °[address] °[place],  [place] °336-227-7417 ° °[facility]: Residential Program (90 Days)   °[place], [place]      °[phone] or [phone]    ° °[facility] °[place], [place] °(Walk in Hours over the weekend or by referral) ° °[facility] °[address], [place], [place] [address] °([phone] ° °Crisis Mobile: [facility]:  1-877-626-1772 (for crisis response 24 hours a day) °[facility] Hotline:      1-800-256-2452 °Outpatient Psychiatry and Counseling ° °[facility]: Mobile Crisis   Management 24 hours:  1-877-626-1772 ° °[facility] sliding scale fee and walk in schedule: M-F 8am-12pm/1pm-3pm °[address]  °[place], [place] [address] °[phone] ° °[facility] °[address] °[place], [place] [address] °[phone] ° °[facility] ([facility])- new patient walk-in appointments available Monday - Friday 8am -3pm.          °[address] °[place], [place] [address] °336-676-6840 or crisis line- 336-676-6905 ° °[facility] Intensive Outpatient Therapy Program °[address] °[place], [place] [address] °336-832-9804 ° °[facility]                  °Crisis Services      °[phone]      °[address]     °[place], [place] [address]                ° °[facility]   °[facility] °[phone] °[address] °[place], [place] [address] ° ° °[facility]          °[address]  °[place], [place] [address]       °[phone] ° °[facility] °[address] °[place], [place] [address] °336-292-1510 ° °[facility]    °[address]    °[place], [place] [address]     °[phone]      ° °Parish McKinney, MD     °[address]     °[place] [address]     °[phone]     °  °[facility] °[address] °[place] [address] ° °[facility]     °[address]     °[place], [place]      °336-542-2076      ° °[facility] °Shamsher Ahluwalia, MD °[address] °[place], [place] [address] °[phone] ° °[facility]     °[address]     °[place], [place] [address]     °336-274-1237      ° °[facility] °[address] °[place], [place] [address] °336-854-4450 °Resources for Temporary Residential Assistance/Crisis Centers ° °DAY CENTERS °[facility] °M-F 8am-3pm   °[address] [place], [place] [address]   336-332-0824 °Services include: laundry, barbering, support groups, case management, phone  & computer access, showers, AA/NA mtgs, mental health/substance abuse nurse, job skills class, disability information, VA assistance, spiritual classes, etc.  ° °HOMELESS SHELTERS ° °[facility]     °[facility]   °[address], [place]     °[phone]       °       °[facility] (women and children)       °[address] °[place], [place] [address] °336-275-0820 °[email or web address] for application and process °Application Required ° °[facility]   °[address]    °[place]  [address]     °[phone]       °             °[facility] °[address] °[place],  [address] °336.273.5572 °336-235-0363(schedule application appt.) °Application Required ° °[facility] (women only)    °[address]     °[place],  [address]     °336-884-1039      °  Intake starts 6pm daily °Need valid ID, SSC, & Police report °[facility] [place] °[address] °[place], [place] °336-881-5420 °Application Required ° °[facility] (men only)     °[address]      °[place], [place]     °[phone]      ° °[facility] °(Pregnant women only) °[address] °[place], [place] °336-275-0206 ° °[facility]      °[address]      °[place], [place] [address]     °[phone]      °       °[facility] °[address] °[place], [place] °336-723-1848 °90 day commitment/SA/Application process ° °[facility](men only)     °[address]     °[place], [place]     °[phone]       °Check-in at 7pm     °       °[facility] °[address] °[place], [place] [address] °336-248-6684 °Men/Women/Women and Children must be there by 7 pm ° °[facility] °[place], [place] °336-722-8721                ° °

## 2022-08-21 NOTE — ED Provider Notes (Signed)
[facility] Provider Note   CSN: [id number] Arrival date & time: 08/21/22  6812     History  Chief Complaint  Patient presents with   Shortness of Breath    Anthony [name] is a 67 y.o. male.  Patient checked back in after being discharged.  Says he is short of breath.  Denies any chest pain.  He has chronic pain he states.  Denies any chest pain, abdominal pain, back pain.  History of alcohol abuse.  Admits to alcohol use earlier in the night.  Denies any weakness, numbness, chills.  The history is provided by the patient.       Home Medications Prior to Admission medications   Medication Sig Start Date End Date Taking? Authorizing Provider  acetaminophen (TYLENOL) 500 MG tablet Take 500-1,000 mg by mouth every 6 (six) hours as needed (for pain, headaches, or dental pain).    [provider]  amiodarone (PACERONE) 200 MG tablet TAKE 1 TABLET (200 MG TOTAL) BY MOUTH DAILY. Patient not taking: Reported on 07/23/2022 03/25/22 03/25/23  Geradine Girt, DO  ascorbic acid (VITAMIN C) 500 MG tablet Take 500 mg by mouth daily as needed (for supplementation). Patient not taking: Reported on 07/23/2022    [provider]  aspirin EC 325 MG tablet Take 325 mg by mouth every 6 (six) hours as needed (for pain, headaches, or dental pain).    [provider]  B Complex-Folic Acid (B COMPLEX PLUS) TABS Take 1 tablet by mouth daily as needed (for supplementation). Patient not taking: Reported on 07/23/2022    [provider]  CALCIUM PO Take 1 tablet by mouth 2 (two) times a week. Patient not taking: Reported on 07/23/2022    [provider]  Cholecalciferol (VITAMIN D3 PO) Take 1 capsule by mouth daily as needed (for supplementation). Patient not taking: Reported on 07/23/2022    [provider]  diclofenac Sodium (VOLTAREN) 1 % GEL Apply 2 g topically 2 (two) times daily as needed (for pain). Patient not  taking: Reported on 07/23/2022 06/02/22   Arlyce Dice, MD  ferrous sulfate 325 (65 FE) MG tablet Take 1 tablet (325 mg total) by mouth daily with breakfast. Patient not taking: Reported on 07/23/2022 06/03/22   Arlyce Dice, MD  folic acid (FOLVITE) 1 MG tablet Take 1 tablet (1 mg total) by mouth daily. Patient not taking: Reported on 07/23/2022 03/20/22   Sharion Settler, DO  loratadine (CLARITIN) 10 MG tablet TAKE 1 TABLET (10 MG TOTAL) BY MOUTH DAILY. Patient not taking: Reported on 07/23/2022 10/08/20 07/23/22  Lilland, Lorrin Goodell, DO  methocarbamol (ROBAXIN) 750 MG tablet Take 1 tablet (750 mg total) by mouth every 8 (eight) hours as needed for muscle spasms. Patient not taking: Reported on 07/23/2022 06/02/22   Arlyce Dice, MD  metoprolol succinate (TOPROL-XL) 25 MG 24 hr tablet Take 0.5 tablets (12.5 mg total) by mouth daily. Patient not taking: Reported on 07/23/2022 06/03/22   Arlyce Dice, MD  metoprolol tartrate (LOPRESSOR) 25 MG tablet Take by mouth. Patient not taking: Reported on 07/23/2022 06/15/22   [provider]  mometasone-formoterol (DULERA) 100-5 MCG/ACT AERO INHALE 2 PUFFS INTO THE LUNGS TWO TIMES DAILY. Patient not taking: Reported on 07/23/2022 03/19/22 03/19/23  Sharion Settler, DO  Multiple Vitamin (MULTIVITAMIN WITH MINERALS) TABS tablet Take 1 tablet by mouth daily. Patient not taking: Reported on 07/23/2022 06/03/22   Arlyce Dice, MD  Nystatin (GERHARDT'S BUTT CREAM) CREA Apply 1  Application topically as needed for irritation. Patient not taking: Reported on 07/23/2022 06/02/22   Arlyce Dice, MD  nystatin (MYCOSTATIN/NYSTOP) powder Apply topically as needed (irritation). Patient not taking: Reported on 07/23/2022 06/02/22   Arlyce Dice, MD  polyethylene glycol powder (GLYCOLAX/MIRALAX) 17 GM/SCOOP powder Take 17 g by mouth daily. (Mix with 8 ounces of liquid as directed) 07/24/22   Leanord Asal K, DO  psyllium (METAMUCIL) 0.52 g capsule Take 1 capsule (0.52 g total) by  mouth daily. 07/24/22   Leanord Asal K, DO  rosuvastatin (CRESTOR) 10 MG tablet Take 1 tablet (10 mg total) by mouth daily. Patient not taking: Reported on 07/23/2022 06/03/22   Arlyce Dice, MD  sertraline (ZOLOFT) 50 MG tablet Take 50 mg by mouth daily. Patient not taking: Reported on 07/23/2022    [provider]  thiamine (VITAMIN B1) 100 MG tablet Take 1 tablet (100 mg total) by mouth daily. Patient not taking: Reported on 07/23/2022 03/20/22   Sharion Settler, DO      Allergies    Other and Poison ivy extract    Review of Systems   Review of Systems  Physical Exam Updated Vital Signs BP 99/67 (BP Location: Left Arm)   Pulse (!) 102   Temp 98.2 F (36.8 C) (Oral)   Resp 18   SpO2 99%  Physical Exam Vitals and nursing note reviewed.  Constitutional:      General: He is not in acute distress.    Appearance: He is well-developed.  HENT:     Head: Normocephalic and atraumatic.  Eyes:     Extraocular Movements: Extraocular movements intact.     Conjunctiva/sclera: Conjunctivae normal.     Pupils: Pupils are equal, round, and reactive to light.  Cardiovascular:     Rate and Rhythm: Normal rate and regular rhythm.     Pulses: Normal pulses.     Heart sounds: Normal heart sounds. No murmur heard. Pulmonary:     Effort: Pulmonary effort is normal. No respiratory distress.     Breath sounds: Normal breath sounds. No decreased breath sounds, wheezing or rhonchi.  Abdominal:     Palpations: Abdomen is soft.     Tenderness: There is no abdominal tenderness.  Musculoskeletal:        General: No swelling.     Cervical back: Normal range of motion and neck supple.  Skin:    General: Skin is warm and dry.     Capillary Refill: Capillary refill takes less than 2 seconds.  Neurological:     General: No focal deficit present.     Mental Status: He is alert.  Psychiatric:        Mood and Affect: Mood normal.     ED Results / Procedures / Treatments   Labs (all  labs ordered are listed, but only abnormal results are displayed) Labs Reviewed - No data to display  EKG None  Radiology No results found.  Procedures Procedures    Medications Ordered in ED Medications  albuterol (VENTOLIN HFA) 108 (90 Base) MCG/ACT inhaler 2 puff (2 puffs Inhalation Given 08/21/22 0751)    ED Course/ Medical Decision Making/ A&P                             Medical Decision Making Risk Prescription drug management.   Anthony [name] is here with shortness of breath.  Patient was just discharged about an hour ago.  Was here for suspected  alcohol intoxication.  Per my review of chart he was brought in from [facility] refusing to leave there with suspected alcohol intoxication.  Patient is ambulating around the room without any issues.  He denies any chest pain.  Patient has normal vitals.  Clear breath sounds.  No signs of volume overload on exam.  Ultimately he appears very well.  No signs of respiratory distress.  Multiple ED visits for the same.  Patient to be discharged as previous plan.  This chart was dictated using voice recognition software.  Despite best efforts to proofread,  errors can occur which can change the documentation meaning.         Final Clinical Impression(s) / ED Diagnoses Final diagnoses:  SOB (shortness of breath)    Rx / DC Orders ED Discharge Orders     None         Lennice Sites, DO 08/21/22 [phone]

## 2022-08-21 NOTE — ED Triage Notes (Signed)
Pt c/o sob x 6 months, reports chronic pain, denies new complaints. Pt seen and discharged earlier tonight

## 2022-08-21 NOTE — ED Notes (Addendum)
Pt in triage is complaining that "the doctor saw that I could walk across the room so that they told him to get the hell out of here" and "I couldn't make it to the bus stop if I tried.  Seems aggravated but calmed down

## 2022-08-21 NOTE — ED Triage Notes (Signed)
Patient arrives via ems due Etho intoxication.  Patient was at [facility] and  police was called due to patient refusing to leave, patient states is homeless.

## 2022-08-21 NOTE — ED Notes (Signed)
Gave pt lunch pack and soda

## 2022-08-21 NOTE — ED Provider Notes (Signed)
[facility] Provider Note   CSN: [id number] Arrival date & time: 08/21/22  0008     History  Chief Complaint  Patient presents with   Homeless   Level 5 caveat due to intoxication Anthony [name] is a 67 y.o. male.  The history is provided by the patient.   Patient arrives via EMS for concern for alcohol intoxication.  Patient was at [facility] and was refusing to leave, therefore police were called.  Paramedics were then summoned because patient was unable to ambulate.  Patient was clearly intoxicated with a large bottle of whiskey. Patient has no complaints at this time    Home Medications Prior to Admission medications   Medication Sig Start Date End Date Taking? Authorizing Provider  acetaminophen (TYLENOL) 500 MG tablet Take 500-1,000 mg by mouth every 6 (six) hours as needed (for pain, headaches, or dental pain).    [provider]  amiodarone (PACERONE) 200 MG tablet TAKE 1 TABLET (200 MG TOTAL) BY MOUTH DAILY. Patient not taking: Reported on 07/23/2022 03/25/22 03/25/23  Geradine Girt, DO  ascorbic acid (VITAMIN C) 500 MG tablet Take 500 mg by mouth daily as needed (for supplementation). Patient not taking: Reported on 07/23/2022    [provider]  aspirin EC 325 MG tablet Take 325 mg by mouth every 6 (six) hours as needed (for pain, headaches, or dental pain).    [provider]  B Complex-Folic Acid (B COMPLEX PLUS) TABS Take 1 tablet by mouth daily as needed (for supplementation). Patient not taking: Reported on 07/23/2022    [provider]  CALCIUM PO Take 1 tablet by mouth 2 (two) times a week. Patient not taking: Reported on 07/23/2022    [provider]  Cholecalciferol (VITAMIN D3 PO) Take 1 capsule by mouth daily as needed (for supplementation). Patient not taking: Reported on 07/23/2022    [provider]  diclofenac Sodium (VOLTAREN) 1 % GEL Apply 2 g topically 2 (two) times  daily as needed (for pain). Patient not taking: Reported on 07/23/2022 06/02/22   Arlyce Dice, MD  ferrous sulfate 325 (65 FE) MG tablet Take 1 tablet (325 mg total) by mouth daily with breakfast. Patient not taking: Reported on 07/23/2022 06/03/22   Arlyce Dice, MD  folic acid (FOLVITE) 1 MG tablet Take 1 tablet (1 mg total) by mouth daily. Patient not taking: Reported on 07/23/2022 03/20/22   Sharion Settler, DO  loratadine (CLARITIN) 10 MG tablet TAKE 1 TABLET (10 MG TOTAL) BY MOUTH DAILY. Patient not taking: Reported on 07/23/2022 10/08/20 07/23/22  Lilland, Lorrin Goodell, DO  methocarbamol (ROBAXIN) 750 MG tablet Take 1 tablet (750 mg total) by mouth every 8 (eight) hours as needed for muscle spasms. Patient not taking: Reported on 07/23/2022 06/02/22   Arlyce Dice, MD  metoprolol succinate (TOPROL-XL) 25 MG 24 hr tablet Take 0.5 tablets (12.5 mg total) by mouth daily. Patient not taking: Reported on 07/23/2022 06/03/22   Arlyce Dice, MD  metoprolol tartrate (LOPRESSOR) 25 MG tablet Take by mouth. Patient not taking: Reported on 07/23/2022 06/15/22   [provider]  mometasone-formoterol (DULERA) 100-5 MCG/ACT AERO INHALE 2 PUFFS INTO THE LUNGS TWO TIMES DAILY. Patient not taking: Reported on 07/23/2022 03/19/22 03/19/23  Sharion Settler, DO  Multiple Vitamin (MULTIVITAMIN WITH MINERALS) TABS tablet Take 1 tablet by mouth daily. Patient not taking: Reported on 07/23/2022 06/03/22   Arlyce Dice, MD  Nystatin (GERHARDT'S BUTT CREAM) CREA Apply 1 Application topically  as needed for irritation. Patient not taking: Reported on 07/23/2022 06/02/22   Arlyce Dice, MD  nystatin (MYCOSTATIN/NYSTOP) powder Apply topically as needed (irritation). Patient not taking: Reported on 07/23/2022 06/02/22   Arlyce Dice, MD  polyethylene glycol powder (GLYCOLAX/MIRALAX) 17 GM/SCOOP powder Take 17 g by mouth daily. (Mix with 8 ounces of liquid as directed) 07/24/22   Leanord Asal K, DO  psyllium (METAMUCIL) 0.52 g  capsule Take 1 capsule (0.52 g total) by mouth daily. 07/24/22   Leanord Asal K, DO  rosuvastatin (CRESTOR) 10 MG tablet Take 1 tablet (10 mg total) by mouth daily. Patient not taking: Reported on 07/23/2022 06/03/22   Arlyce Dice, MD  sertraline (ZOLOFT) 50 MG tablet Take 50 mg by mouth daily. Patient not taking: Reported on 07/23/2022    [provider]  thiamine (VITAMIN B1) 100 MG tablet Take 1 tablet (100 mg total) by mouth daily. Patient not taking: Reported on 07/23/2022 03/20/22   Sharion Settler, DO      Allergies    Other and Poison ivy extract    Review of Systems   Review of Systems  Unable to perform ROS: Other    Physical Exam Updated Vital Signs BP 117/67 (BP Location: Right Arm)   Pulse 71   Temp 98.2 F (36.8 C) (Oral)   Resp 18   Ht 1.778 m ('5\' 10"'$ )   Wt 63.5 kg   SpO2 99%   BMI 20.09 kg/m  Physical Exam CONSTITUTIONAL: Disheveled HEAD: Normocephalic/atraumatic EYES: EOMI ABDOMEN: soft, nontender NEURO: Pt is awake/alert/appropriate, moves all extremitiesx4.   Patient is unable to ambulate EXTREMITIES: full ROM, minimal lower extremity edema is noted SKIN: warm, color normal  ED Results / Procedures / Treatments   Labs (all labs ordered are listed, but only abnormal results are displayed) Labs Reviewed - No data to display  EKG None  Radiology No results found.  Procedures Procedures    Medications Ordered in ED Medications - No data to display  ED Course/ Medical Decision Making/ A&P Clinical Course as of 08/21/22 0443  Fri Aug 21, 2022  0110 Patient presents for his 11th ER visit in 6 months.  Patient is unhoused and has a history of alcohol use disorder.  He is clearly intoxicated and arrived with a large bottle of whiskey.  Patient is unable to ambulate.  Will allow him to sleep and then reassess.  No other acute medical issues at this time [DW]  0303 Patient resting calmly, no acute distress [DW]  [phone] Patient appearing  improved awake and alert and eating and drinking.  He will be discharged [DW]    Clinical Course User Index [DW] Ripley Fraise, MD                             Medical Decision Making          Final Clinical Impression(s) / ED Diagnoses Final diagnoses:  Alcohol use disorder    Rx / DC Orders ED Discharge Orders     None         Ripley Fraise, MD 08/21/22 [phone]

## 2022-08-21 NOTE — ED Notes (Signed)
Discharge instructions reviewed with patient. Patient raised concerns about leaving. MD at bedside to discuss discharge with patient. Patient agreeable, ambulatory out to lobby.

## 2022-08-27 ENCOUNTER — Emergency Department (HOSPITAL_COMMUNITY): Payer: 59

## 2022-08-27 ENCOUNTER — Other Ambulatory Visit: Payer: Self-pay

## 2022-08-27 ENCOUNTER — Encounter (HOSPITAL_COMMUNITY): Payer: Self-pay

## 2022-08-27 ENCOUNTER — Inpatient Hospital Stay (HOSPITAL_COMMUNITY)
Admission: EM | Admit: 2022-08-27 | Discharge: 2022-09-05 | DRG: 177 | Disposition: A | Discharge status: Home / Self Care | Payer: 59 | Attending: Internal Medicine | Admitting: Internal Medicine

## 2022-08-27 DIAGNOSIS — E871 Hypo-osmolality and hyponatremia: Secondary | ICD-10-CM | POA: Diagnosis not present

## 2022-08-27 DIAGNOSIS — Z609 Problem related to social environment, unspecified: Secondary | ICD-10-CM

## 2022-08-27 DIAGNOSIS — R0602 Shortness of breath: Principal | ICD-10-CM

## 2022-08-27 DIAGNOSIS — D539 Nutritional anemia, unspecified: Secondary | ICD-10-CM | POA: Diagnosis present

## 2022-08-27 DIAGNOSIS — E876 Hypokalemia: Secondary | ICD-10-CM | POA: Diagnosis present

## 2022-08-27 DIAGNOSIS — I10 Essential (primary) hypertension: Secondary | ICD-10-CM | POA: Diagnosis present

## 2022-08-27 DIAGNOSIS — I700 Atherosclerosis of aorta: Secondary | ICD-10-CM | POA: Diagnosis present

## 2022-08-27 DIAGNOSIS — Z8249 Family history of ischemic heart disease and other diseases of the circulatory system: Secondary | ICD-10-CM

## 2022-08-27 DIAGNOSIS — I110 Hypertensive heart disease with heart failure: Secondary | ICD-10-CM | POA: Diagnosis present

## 2022-08-27 DIAGNOSIS — F17200 Nicotine dependence, unspecified, uncomplicated: Secondary | ICD-10-CM | POA: Diagnosis present

## 2022-08-27 DIAGNOSIS — F411 Generalized anxiety disorder: Secondary | ICD-10-CM | POA: Diagnosis present

## 2022-08-27 DIAGNOSIS — Z9849 Cataract extraction status, unspecified eye: Secondary | ICD-10-CM

## 2022-08-27 DIAGNOSIS — I480 Paroxysmal atrial fibrillation: Secondary | ICD-10-CM | POA: Diagnosis present

## 2022-08-27 DIAGNOSIS — E785 Hyperlipidemia, unspecified: Secondary | ICD-10-CM | POA: Diagnosis present

## 2022-08-27 DIAGNOSIS — K449 Diaphragmatic hernia without obstruction or gangrene: Secondary | ICD-10-CM

## 2022-08-27 DIAGNOSIS — Z781 Physical restraint status: Secondary | ICD-10-CM | POA: Diagnosis not present

## 2022-08-27 DIAGNOSIS — R197 Diarrhea, unspecified: Secondary | ICD-10-CM | POA: Diagnosis not present

## 2022-08-27 DIAGNOSIS — I951 Orthostatic hypotension: Secondary | ICD-10-CM | POA: Diagnosis not present

## 2022-08-27 DIAGNOSIS — E440 Moderate protein-calorie malnutrition: Secondary | ICD-10-CM | POA: Diagnosis present

## 2022-08-27 DIAGNOSIS — D61818 Other pancytopenia: Secondary | ICD-10-CM | POA: Diagnosis present

## 2022-08-27 DIAGNOSIS — Z1152 Encounter for screening for COVID-19: Secondary | ICD-10-CM

## 2022-08-27 DIAGNOSIS — Z91148 Patient's other noncompliance with medication regimen for other reason: Secondary | ICD-10-CM

## 2022-08-27 DIAGNOSIS — I5043 Acute on chronic combined systolic (congestive) and diastolic (congestive) heart failure: Secondary | ICD-10-CM | POA: Diagnosis present

## 2022-08-27 DIAGNOSIS — I959 Hypotension, unspecified: Secondary | ICD-10-CM | POA: Diagnosis not present

## 2022-08-27 DIAGNOSIS — Y900 Blood alcohol level of less than 20 mg/100 ml: Secondary | ICD-10-CM | POA: Diagnosis present

## 2022-08-27 DIAGNOSIS — J690 Pneumonitis due to inhalation of food and vomit: Principal | ICD-10-CM | POA: Diagnosis present

## 2022-08-27 DIAGNOSIS — Z659 Problem related to unspecified psychosocial circumstances: Secondary | ICD-10-CM

## 2022-08-27 DIAGNOSIS — F10931 Alcohol use, unspecified with withdrawal delirium: Secondary | ICD-10-CM | POA: Diagnosis not present

## 2022-08-27 DIAGNOSIS — G9341 Metabolic encephalopathy: Secondary | ICD-10-CM | POA: Diagnosis present

## 2022-08-27 DIAGNOSIS — I5033 Acute on chronic diastolic (congestive) heart failure: Secondary | ICD-10-CM | POA: Diagnosis present

## 2022-08-27 DIAGNOSIS — G8929 Other chronic pain: Secondary | ICD-10-CM | POA: Diagnosis present

## 2022-08-27 DIAGNOSIS — D638 Anemia in other chronic diseases classified elsewhere: Secondary | ICD-10-CM | POA: Diagnosis present

## 2022-08-27 DIAGNOSIS — Z720 Tobacco use: Secondary | ICD-10-CM

## 2022-08-27 DIAGNOSIS — Z7982 Long term (current) use of aspirin: Secondary | ICD-10-CM

## 2022-08-27 DIAGNOSIS — G312 Degeneration of nervous system due to alcohol: Secondary | ICD-10-CM | POA: Diagnosis not present

## 2022-08-27 DIAGNOSIS — F10231 Alcohol dependence with withdrawal delirium: Secondary | ICD-10-CM | POA: Diagnosis not present

## 2022-08-27 DIAGNOSIS — R6889 Other general symptoms and signs: Secondary | ICD-10-CM | POA: Diagnosis present

## 2022-08-27 DIAGNOSIS — F1020 Alcohol dependence, uncomplicated: Secondary | ICD-10-CM | POA: Diagnosis present

## 2022-08-27 DIAGNOSIS — Z86711 Personal history of pulmonary embolism: Secondary | ICD-10-CM | POA: Diagnosis present

## 2022-08-27 DIAGNOSIS — M549 Dorsalgia, unspecified: Secondary | ICD-10-CM | POA: Diagnosis present

## 2022-08-27 DIAGNOSIS — Z5901 Sheltered homelessness: Secondary | ICD-10-CM | POA: Diagnosis not present

## 2022-08-27 DIAGNOSIS — Z79899 Other long term (current) drug therapy: Secondary | ICD-10-CM | POA: Diagnosis not present

## 2022-08-27 DIAGNOSIS — F32A Depression, unspecified: Secondary | ICD-10-CM | POA: Diagnosis present

## 2022-08-27 DIAGNOSIS — Z823 Family history of stroke: Secondary | ICD-10-CM

## 2022-08-27 DIAGNOSIS — Z85828 Personal history of other malignant neoplasm of skin: Secondary | ICD-10-CM

## 2022-08-27 DIAGNOSIS — Z681 Body mass index (BMI) 19 or less, adult: Secondary | ICD-10-CM

## 2022-08-27 HISTORY — DX: Atherosclerosis of aorta: I70.0

## 2022-08-27 LAB — COMPREHENSIVE METABOLIC PANEL
ALT: 15 U/L (ref 0–44)
AST: 36 U/L (ref 15–41)
Albumin: 2.7 g/dL — ABNORMAL LOW (ref 3.5–5.0)
Alkaline Phosphatase: 113 U/L (ref 38–126)
Anion gap: 13 (ref 5–15)
BUN: 5 mg/dL — ABNORMAL LOW (ref 8–23)
CO2: 17 mmol/L — ABNORMAL LOW (ref 22–32)
Calcium: 7.8 mg/dL — ABNORMAL LOW (ref 8.9–10.3)
Chloride: 102 mmol/L (ref 98–111)
Creatinine, Ser: 0.62 mg/dL (ref 0.61–1.24)
GFR, Estimated: >60 mL/min (ref >60)
Glucose, Bld: 73 mg/dL (ref 70–99)
Potassium: 3.7 mmol/L (ref 3.5–5.1)
Sodium: 132 mmol/L — ABNORMAL LOW (ref 135–145)
Total Bilirubin: 1.2 mg/dL (ref 0.3–1.2)
Total Protein: 7.2 g/dL (ref 6.5–8.1)

## 2022-08-27 LAB — ACETAMINOPHEN LEVEL: Acetaminophen (Tylenol), Serum: <10 ug/mL — ABNORMAL LOW (ref 10–30)

## 2022-08-27 LAB — CBC WITH DIFFERENTIAL/PLATELET
Abs Immature Granulocytes: 0.09 10*3/uL — ABNORMAL HIGH (ref 0.00–0.07)
Basophils Absolute: 0 10*3/uL (ref 0.0–0.1)
Basophils Relative: 0 %
Eosinophils Absolute: 0 10*3/uL (ref 0.0–0.5)
Eosinophils Relative: 0 %
HCT: 32.4 % — ABNORMAL LOW (ref 39.0–52.0)
Hemoglobin: 10.7 g/dL — ABNORMAL LOW (ref 13.0–17.0)
Immature Granulocytes: 1 %
Lymphocytes Relative: 7 %
Lymphs Abs: 0.8 10*3/uL (ref 0.7–4.0)
MCH: 35.8 pg — ABNORMAL HIGH (ref 26.0–34.0)
MCHC: 33 g/dL (ref 30.0–36.0)
MCV: 108.4 fL — ABNORMAL HIGH (ref 80.0–100.0)
Monocytes Absolute: 0.6 10*3/uL (ref 0.1–1.0)
Monocytes Relative: 5 %
Neutro Abs: 9.7 10*3/uL — ABNORMAL HIGH (ref 1.7–7.7)
Neutrophils Relative %: 87 %
Platelets: 231 10*3/uL (ref 150–400)
RBC: 2.99 MIL/uL — ABNORMAL LOW (ref 4.22–5.81)
RDW: 18.5 % — ABNORMAL HIGH (ref 11.5–15.5)
WBC: 11.2 10*3/uL — ABNORMAL HIGH (ref 4.0–10.5)
nRBC: 0 % (ref 0.0–0.2)

## 2022-08-27 LAB — BLOOD GAS, VENOUS
Acid-base deficit: 0.9 mmol/L (ref 0.0–2.0)
Bicarbonate: 22 mmol/L (ref 20.0–28.0)
O2 Saturation: 92.8 %
Patient temperature: 37
pCO2, Ven: 31 mmHg — ABNORMAL LOW (ref 44–60)
pH, Ven: 7.46 — ABNORMAL HIGH (ref 7.25–7.43)
pO2, Ven: 62 mmHg — ABNORMAL HIGH (ref 32–45)

## 2022-08-27 LAB — TROPONIN I (HIGH SENSITIVITY)
Troponin I (High Sensitivity): 19 ng/L — ABNORMAL HIGH (ref <18)
Troponin I (High Sensitivity): 20 ng/L — ABNORMAL HIGH (ref <18)

## 2022-08-27 LAB — ETHANOL: Alcohol, Ethyl (B): <10 mg/dL (ref <10)

## 2022-08-27 LAB — MAGNESIUM: Magnesium: 0.8 mg/dL — CL (ref 1.7–2.4)

## 2022-08-27 LAB — BRAIN NATRIURETIC PEPTIDE: B Natriuretic Peptide: 529.3 pg/mL — ABNORMAL HIGH (ref 0.0–100.0)

## 2022-08-27 LAB — EXPECTORATED SPUTUM ASSESSMENT W GRAM STAIN, RFLX TO RESP C

## 2022-08-27 LAB — RESP PANEL BY RT-PCR (RSV, FLU A&B, COVID)  RVPGX2
Influenza A by PCR: NEGATIVE
Influenza B by PCR: NEGATIVE
Resp Syncytial Virus by PCR: NEGATIVE
SARS Coronavirus 2 by RT PCR: NEGATIVE

## 2022-08-27 LAB — D-DIMER, QUANTITATIVE: D-Dimer, Quant: 1.82 ug/mL-FEU — ABNORMAL HIGH (ref 0.00–0.50)

## 2022-08-27 LAB — STREP PNEUMONIAE URINARY ANTIGEN: Strep Pneumo Urinary Antigen: NEGATIVE

## 2022-08-27 LAB — SALICYLATE LEVEL: Salicylate Lvl: <7 mg/dL — ABNORMAL LOW (ref 7.0–30.0)

## 2022-08-27 LAB — LACTIC ACID, PLASMA: Lactic Acid, Venous: 0.9 mmol/L (ref 0.5–1.9)

## 2022-08-27 MED ORDER — MAGNESIUM SULFATE 2 GM/50ML IV SOLN
2.0000 g | Freq: Once | INTRAVENOUS | Status: AC
  Administered 2022-08-27: 2 g via INTRAVENOUS
  Filled 2022-08-27: qty 50

## 2022-08-27 MED ORDER — PIPERACILLIN-TAZOBACTAM 3.375 G IVPB 30 MIN
3.3750 g | Freq: Three times a day (TID) | INTRAVENOUS | Status: DC
  Administered 2022-08-27 – 2022-08-29 (×7): 3.375 g via INTRAVENOUS
  Filled 2022-08-27 (×14): qty 50

## 2022-08-27 MED ORDER — METRONIDAZOLE 500 MG/100ML IV SOLN
500.0000 mg | Freq: Once | INTRAVENOUS | Status: AC
  Administered 2022-08-27: 500 mg via INTRAVENOUS
  Filled 2022-08-27: qty 100

## 2022-08-27 MED ORDER — ADULT MULTIVITAMIN W/MINERALS CH
1.0000 | ORAL_TABLET | Freq: Every day | ORAL | Status: DC
  Administered 2022-08-27: 1 via ORAL
  Filled 2022-08-27: qty 1

## 2022-08-27 MED ORDER — ACETAMINOPHEN 650 MG RE SUPP: 650.0000 mg | Freq: Four times a day (QID) | RECTAL | Status: DC | PRN

## 2022-08-27 MED ORDER — VANCOMYCIN HCL 750 MG/150ML IV SOLN: 750.0000 mg | Freq: Two times a day (BID) | INTRAVENOUS | Status: DC

## 2022-08-27 MED ORDER — FOLIC ACID 1 MG PO TABS
1.0000 mg | ORAL_TABLET | Freq: Every day | ORAL | Status: DC
  Administered 2022-08-27: 1 mg via ORAL
  Filled 2022-08-27: qty 1

## 2022-08-27 MED ORDER — LISINOPRIL 5 MG PO TABS
2.5000 mg | ORAL_TABLET | Freq: Every day | ORAL | Status: DC
  Administered 2022-08-27: 2.5 mg via ORAL
  Filled 2022-08-27: qty 1

## 2022-08-27 MED ORDER — LORAZEPAM 1 MG PO TABS
1.0000 mg | ORAL_TABLET | ORAL | Status: DC | PRN
  Filled 2022-08-27: qty 1

## 2022-08-27 MED ORDER — FAMOTIDINE 20 MG PO TABS
20.0000 mg | ORAL_TABLET | Freq: Two times a day (BID) | ORAL | Status: DC
  Administered 2022-08-27 (×2): 20 mg via ORAL
  Filled 2022-08-27 (×2): qty 1

## 2022-08-27 MED ORDER — PROCHLORPERAZINE EDISYLATE 10 MG/2ML IJ SOLN
5.0000 mg | INTRAMUSCULAR | Status: DC | PRN
  Filled 2022-08-27: qty 2

## 2022-08-27 MED ORDER — VANCOMYCIN HCL IN DEXTROSE 1-5 GM/200ML-% IV SOLN
1000.0000 mg | Freq: Once | INTRAVENOUS | Status: AC
  Administered 2022-08-27: 1000 mg via INTRAVENOUS
  Filled 2022-08-27: qty 200

## 2022-08-27 MED ORDER — ASPIRIN 81 MG PO TBEC: 81.0000 mg | DELAYED_RELEASE_TABLET | Freq: Four times a day (QID) | ORAL | Status: DC | PRN

## 2022-08-27 MED ORDER — SODIUM CHLORIDE 0.9 % IV SOLN
2.0000 g | Freq: Once | INTRAVENOUS | Status: AC
  Administered 2022-08-27: 2 g via INTRAVENOUS
  Filled 2022-08-27: qty 12.5

## 2022-08-27 MED ORDER — METOPROLOL TARTRATE 25 MG PO TABS
12.5000 mg | ORAL_TABLET | Freq: Two times a day (BID) | ORAL | Status: DC
  Administered 2022-08-27 (×2): 12.5 mg via ORAL
  Filled 2022-08-27 (×2): qty 1

## 2022-08-27 MED ORDER — THIAMINE HCL 100 MG/ML IJ SOLN
100.0000 mg | Freq: Every day | INTRAMUSCULAR | Status: DC
  Administered 2022-08-28: 100 mg via INTRAVENOUS
  Filled 2022-08-27: qty 2

## 2022-08-27 MED ORDER — LORAZEPAM 1 MG PO TABS: 0.0000 mg | ORAL_TABLET | Freq: Two times a day (BID) | ORAL | Status: DC

## 2022-08-27 MED ORDER — IPRATROPIUM-ALBUTEROL 0.5-2.5 (3) MG/3ML IN SOLN: 3.0000 mL | Freq: Four times a day (QID) | RESPIRATORY_TRACT | Status: DC | PRN

## 2022-08-27 MED ORDER — POTASSIUM CHLORIDE CRYS ER 20 MEQ PO TBCR
20.0000 meq | EXTENDED_RELEASE_TABLET | Freq: Two times a day (BID) | ORAL | Status: DC
  Administered 2022-08-27 (×2): 20 meq via ORAL
  Filled 2022-08-27 (×2): qty 1

## 2022-08-27 MED ORDER — LORAZEPAM 1 MG PO TABS
0.0000 mg | ORAL_TABLET | Freq: Four times a day (QID) | ORAL | Status: DC
  Administered 2022-08-27 (×3): 1 mg via ORAL
  Administered 2022-08-28: 0 mg via ORAL
  Filled 2022-08-27 (×3): qty 1

## 2022-08-27 MED ORDER — METHOCARBAMOL 500 MG PO TABS
750.0000 mg | ORAL_TABLET | Freq: Three times a day (TID) | ORAL | Status: DC | PRN
  Administered 2022-08-27: 750 mg via ORAL
  Filled 2022-08-27: qty 2

## 2022-08-27 MED ORDER — IOHEXOL 350 MG/ML SOLN
80.0000 mL | Freq: Once | INTRAVENOUS | Status: AC | PRN
  Administered 2022-08-27: 80 mL via INTRAVENOUS

## 2022-08-27 MED ORDER — THIAMINE HCL 100 MG/ML IJ SOLN
100.0000 mg | Freq: Once | INTRAMUSCULAR | Status: AC
  Administered 2022-08-27: 100 mg via INTRAVENOUS
  Filled 2022-08-27: qty 2

## 2022-08-27 MED ORDER — THIAMINE MONONITRATE 100 MG PO TABS
100.0000 mg | ORAL_TABLET | Freq: Every day | ORAL | Status: DC
  Filled 2022-08-27: qty 1

## 2022-08-27 MED ORDER — SODIUM CHLORIDE (PF) 0.9 % IJ SOLN
INTRAMUSCULAR | Status: AC
  Filled 2022-08-27: qty 50

## 2022-08-27 MED ORDER — ACETAMINOPHEN 325 MG PO TABS: 650.0000 mg | ORAL_TABLET | Freq: Four times a day (QID) | ORAL | Status: DC | PRN

## 2022-08-27 MED ORDER — FUROSEMIDE 10 MG/ML IJ SOLN
20.0000 mg | Freq: Two times a day (BID) | INTRAMUSCULAR | Status: DC
  Administered 2022-08-27: 20 mg via INTRAVENOUS
  Filled 2022-08-27 (×2): qty 2

## 2022-08-27 MED ORDER — LORAZEPAM 1 MG PO TABS
1.0000 mg | ORAL_TABLET | ORAL | Status: DC | PRN
  Administered 2022-08-28 (×2): 2 mg via ORAL
  Filled 2022-08-27 (×2): qty 2

## 2022-08-27 MED ORDER — FUROSEMIDE 10 MG/ML IJ SOLN
20.0000 mg | Freq: Every day | INTRAMUSCULAR | Status: DC
  Administered 2022-08-28: 20 mg via INTRAVENOUS
  Filled 2022-08-27: qty 2

## 2022-08-27 NOTE — TOC Initial Note (Addendum)
Transition of Care [facility]) - Initial/Assessment Note    Patient Details  Name: Anthony [name] MRN: [id number] Date of Birth: 1955/11/30  [facility]) CM/SW Contact:    Henrietta Dine, RN Phone Number: 08/27/2022, 10:10 AM  Clinical Narrative:                 [facility] consulted for SDOH Risk and SA; spoke w/ pt in room; pt says he is from [facility] and he plans to return at d/c; he denies IPV, problems paying utilities, and food insecurity; pt says he does not have transport back to [facility]; pt says he has reading glasses; he also says he uses a cane occasionally, but he lost the last one; pt says he does not have other DME, [place] services, or home oxygen; pt says there are bars in shower at [facility]; pt agrees to receiving resources for SA and community resources; copies of resources given to pt ( IP/OP SA counseling/education, shelter, transportation, and social services; resources also placed in d/c instructions; pt will make appt at these agenices; TOC will follow for d/c needs.  Expected Discharge Plan: Homeless Shelter Barriers to Discharge: Continued Medical Work up   Patient Goals and CMS Choice Patient states their goals for this hospitalization and ongoing recovery are:: back to shelter CMS Medicare.gov Compare Post Acute Care list provided to:: Patient        Expected Discharge Plan and Services   Discharge Planning Services: CM Consult   Living arrangements for the past 2 months: Homeless Shelter                                      Prior Living Arrangements/Services Living arrangements for the past 2 months: Homeless Shelter Lives with:: Self Patient language and need for interpreter reviewed:: Yes Do you feel safe going back to the place where you live?: Yes      Need for Family Participation in Patient Care: Yes (Comment) Care giver support system in place?: No (comment)   Criminal Activity/Legal Involvement Pertinent to Current Situation/Hospitalization: No -  Comment as needed  Activities of Daily Living      Permission Sought/Granted Permission sought to share information with : Case Manager Permission granted to share information with : Yes, Verbal Permission Granted  Share Information with NAME: Lenor Coffin, RN, CM     Permission granted to share info w Relationship: Kashus Karlen (brother) [phone]     Emotional Assessment Appearance:: Appears stated age Attitude/Demeanor/Rapport: Gracious Affect (typically observed): Accepting Orientation: : Oriented to Self, Oriented to Place, Oriented to  Time, Oriented to Situation   Psych Involvement: No (comment)  Admission diagnosis:  Hypomagnesemia [E83.42] Aspiration pneumonia (HCC) [J69.0] SOB (shortness of breath) [R06.02] Aspiration pneumonia of both lower lobes, unspecified aspiration pneumonia type ([place]) [J69.0] Patient Active Problem List   Diagnosis Date Noted   Aspiration pneumonia ([place]) 08/27/2022   Hyponatremia 06/01/2022   Non compliance w medication regimen 05/31/2022   Delirium 05/25/2022   Stricture and stenosis of esophagus 05/21/2022   Gastritis and gastroduodenitis 05/21/2022   Loose stools 05/20/2022   Malnutrition of moderate degree 05/20/2022   Acute on chronic congestive heart failure ([place])    Leukocytosis 05/19/2022   Sepsis ([place]) 05/19/2022   Cellulitis of left leg, possible 05/19/2022    Class: Question of   Moniliasis, interdigital 05/19/2022   Open toe wound, right foot, medial fourth digit  05/19/2022   Malnutrition ([place]) 05/19/2022   Alcohol abuse 03/24/2022   Dyslipidemia 03/24/2022   Acute on chronic diastolic CHF (congestive heart failure) ([place]) 03/24/2022   Acute respiratory failure with hypoxia (HCC) 03/23/2022   Generalized weakness    Atrial fibrillation with RVR ([place]) 03/15/2022   Hypokalemia 03/15/2022   Iron deficiency anemia 10/16/2020   Cough    Hypomagnesemia    Protein-calorie malnutrition, severe 08/22/2020   Alcohol use  disorder, severe, in controlled environment ([place])    Pressure injury of skin 08/18/2020   Urinary tract infection without hematuria    Severe sepsis ([place]) 08/14/2020   Paroxysmal atrial fibrillation ([place]) 08/13/2020   Rhabdomyolysis 07/22/2020   Cerebral ventriculomegaly 07/22/2020   Hemoglobin decreased 07/22/2020   History of delirium tremons 07/22/2020   Hypoalbuminemia due to protein-calorie malnutrition ([place]) 07/22/2020   Tobacco use disorder 07/22/2020   Alcohol withdrawal delirium ([place])    Dehydration    Alcohol abuse with alcohol-induced mood disorder ([place]) 07/18/2018   Depression 05/26/2018   Anxiety state 04/25/2018   Need for immunization against influenza 04/25/2018   Alcohol withdrawal ([place]) 09/01/2017   DOE (dyspnea on exertion) 11/27/2016   Actinic keratosis 11/27/2016   Macrocytic anemia 11/23/2016   History of pulmonary embolus (PE) 11/02/2016   Hiatal hernia 09/14/2016   Skin cancer 08/13/2016   Poor social situation 06/09/2013   Tinea versicolor 06/08/2013   Back pain 05/07/2013   Seborrheic dermatitis of scalp 11/08/2012   HTN (hypertension) 04/11/2012   Alcohol use disorder, severe, dependence ([place]) 09/16/2006   PCP:  Jacelyn Grip, MD Pharmacy:   [facility] [place] [address] Phone: [phone] Fax: [phone]     Social Determinants of Health (SDOH) Social History: SDOH Screenings   Food Insecurity: No Food Insecurity (08/27/2022)  Housing: High Risk (08/27/2022)  Transportation Needs: Unmet Transportation Needs (08/27/2022)  Utilities: Not At Risk (08/27/2022)  Depression (PHQ2-9): Medium Risk (10/16/2020)  Tobacco Use: High Risk (08/27/2022)   SDOH Interventions: Food Insecurity Interventions: Inpatient TOC Housing Interventions: Inpatient TOC Transportation Interventions: Inpatient TOC Utilities Interventions: Inpatient TOC   Readmission Risk Interventions    08/27/2022   10:08 AM  10/08/2020    2:48 PM  Readmission Risk Prevention Plan  Transportation Screening  Complete  PCP or Specialist Appt within 3-5 Days  Complete  HRI or [place]  Complete  Social Work Consult for [place] Planning/Counseling  Complete  Palliative Care Screening  Complete  Medication Review Press photographer)  Complete  PCP or Specialist appointment within 3-5 days of discharge Complete   HRI or [place] Complete   SW Recovery Care/Counseling Consult Complete   Palliative Care Screening Complete   [place] Not Applicable

## 2022-08-27 NOTE — ED Notes (Signed)
ED TO INPATIENT HANDOFF REPORT  ED Nurse Name and Phone #: Tonette Bihari [phone]  S Name/Age/Gender Anthony [name] 67 y.o. male Room/Bed: WA19/WA19  Code Status   Code Status: Prior  Home/SNF/Other Home Patient oriented to: self, place, time, and situation Is this baseline? Yes   Triage Complete: Triage complete  Chief Complaint Aspiration pneumonia ([place]) [J69.0]  Triage Note Pt BIB EMS after found laying on the ground and c/o SOB. Per EMS, pt has been drinking.   O2 96%   Allergies Allergies  Allergen Reactions   Other Itching and Other (See Comments)    Seasonal allergies- Itchy eyes, runny nose, congestion   Poison Ivy Extract Rash    Level of Care/Admitting Diagnosis ED Disposition     ED Disposition  Admit   Condition  --   Comment  Hospital Area: [place] [[id number]]  Level of Care: Telemetry [5]  Admit to tele based on following criteria: Monitor for Ischemic changes  May admit patient to [facility] or [facility] if equivalent level of care is available:: Yes  Covid Evaluation: Asymptomatic - no recent exposure (last 10 days) testing not required  Diagnosis: Aspiration pneumonia [facility]) [[id number]]  Admitting Physician: Kayleen Memos [[id number]]  Attending Physician: Kayleen Memos [[id number]]  Certification:: I certify this patient will need inpatient services for at least 2 midnights  Estimated Length of Stay: 2          B Medical/Surgery History Past Medical History:  Diagnosis Date   Actinic keratosis 11/27/2016   Alcohol abuse with alcohol-induced mood disorder ([place]) 07/18/2018   Alcohol use disorder, severe, dependence ([place]) 09/16/2006   05/22/2015 Argumentative, belligerent and verbally abusive to staff per his note from [facility])    Anxiety state 04/25/2018   Chronic low back pain 09/16/2006   Followed by NS.  Per his chart from [place], he fell off two storyhouse roof while cleaning his brother's  gutters and sustained compression fracture of L3, L4 and L5 and fracture of right transverse process at L3 and nondisplaced fracture of left glenoid rim many years ago. He had multiple imaging in [place] including Lumbar MRI in 2016 which showed moderate to severe spinal canal stenos   Delirium tremens ([place]) 09/01/2017   Depression    Dry eye 11/23/2016   Foreign body in middle portion of esophagus 05/19/2022   Hiatal hernia 09/14/2016   Status Collis gastroplasty and Nissen's fundoplication on 70/62/3762 in [place]   History of delirium tremons 07/22/2020   DTs during 10/2016 08/2017. And 12/2017 admissions    History of pulmonary embolus (PE) 11/02/2016   Unprovoked 11/01/2016. Xarelto from 11/01/16 to 09/08/2017.   Hydrocele    Surgically corrected   Hypertension    Hypoalbuminemia due to protein-calorie malnutrition ([place]) 07/22/2020   Lumbar compression fracture ([facility])    Multiple rib fractures 07/22/2019   Left rib details XR: left ribs demonstrate multiple remote rib   Pancytopenia ([place])    Rhabdomyolysis 07/22/2020   Skin cancer    exciced 2017   Tinea versicolor 06/08/2013   Tobacco use disorder 07/22/2020   Tooth infection 04/25/2018   Trigger finger, acquired 11/18/2012   Ulnar tunnel syndrome of right wrist 11/08/2012   Past Surgical History:  Procedure Laterality Date   BIOPSY  05/21/2022   Procedure: BIOPSY;  Surgeon: [name], MD;  Location: [facility];  Service: Gastroenterology;;   CATARACT EXTRACTION     ESOPHAGOGASTRODUODENOSCOPY (EGD) WITH PROPOFOL N/A 05/21/2022  Procedure: ESOPHAGOGASTRODUODENOSCOPY (EGD) WITH PROPOFOL;  Surgeon: [name], MD;  Location: [place];  Service: Gastroenterology;  Laterality: N/A;   FOREIGN BODY REMOVAL N/A 05/21/2022   Procedure: FOREIGN BODY REMOVAL;  Surgeon: [name], MD;  Location: [place];  Service: Gastroenterology;  Laterality: N/A;   HIATAL HERNIA REPAIR  2016   HYDROCELE EXCISION / REPAIR   2010   SKIN CANCER EXCISION Left    Excised 2017     A IV Location/Drains/Wounds Patient Lines/Drains/Airways Status     Active Line/Drains/Airways     Name Placement date Placement time Site Days   Peripheral IV 08/27/22 20 G Left Antecubital 08/27/22  0137  Antecubital  less than 1   Pressure Injury 08/14/20 Coccyx Medial Stage 1 -  Intact skin with non-blanchable redness of a localized area usually over a bony prominence. 08/14/20  1530  -- 743   Wound / Incision (Open or Dehisced) 03/16/22 Non-pressure wound Toe (Comment  which one) Anterior;Left 03/16/22  1430  Toe (Comment  which one)  164            Intake/Output Last 24 hours No intake or output data in the 24 hours ending 08/27/22 0723  Labs/Imaging Results for orders placed or performed during the hospital encounter of 08/27/22 (from the past 48 hour(s))  Comprehensive metabolic panel     Status: Abnormal   Collection Time: 08/27/22  1:23 AM  Result Value Ref Range   Sodium 132 (L) 135 - 145 mmol/L   Potassium 3.7 3.5 - 5.1 mmol/L   Chloride 102 98 - 111 mmol/L   CO2 17 (L) 22 - 32 mmol/L   Glucose, Bld 73 70 - 99 mg/dL    Comment: Glucose reference range applies only to samples taken after fasting for at least 8 hours.   BUN 5 (L) 8 - 23 mg/dL   Creatinine, Ser 0.62 0.61 - 1.24 mg/dL   Calcium 7.8 (L) 8.9 - 10.3 mg/dL   Total Protein 7.2 6.5 - 8.1 g/dL   Albumin 2.7 (L) 3.5 - 5.0 g/dL   AST 36 15 - 41 U/L   ALT 15 0 - 44 U/L   Alkaline Phosphatase 113 38 - 126 U/L   Total Bilirubin 1.2 0.3 - 1.2 mg/dL   GFR, Estimated >60 >60 mL/min    Comment: (NOTE) Calculated using the CKD-EPI Creatinine Equation (2021)    Anion gap 13 5 - 15    Comment: Performed at [facility], [place] [address] [place], [place] [address]  Brain natriuretic peptide     Status: Abnormal   Collection Time: 08/27/22  1:23 AM  Result Value Ref Range   B Natriuretic Peptide 529.3 (H) 0.0 - 100.0 pg/mL    Comment:  Performed at [facility], [place] [address], [place], [place] [address]  Troponin I (High Sensitivity)     Status: Abnormal   Collection Time: 08/27/22  1:23 AM  Result Value Ref Range   Troponin I (High Sensitivity) 19 (H) <18 ng/L    Comment: (NOTE) Elevated high sensitivity troponin I (hsTnI) values and significant  changes across serial measurements may suggest ACS but many other  chronic and acute conditions are known to elevate hsTnI results.  Refer to the "Links" section for chest pain algorithms and additional  guidance. Performed at [facility], [place] [address] [place], [place] [address]   Salicylate level     Status: Abnormal   Collection Time: 08/27/22  1:23  AM  Result Value Ref Range   Salicylate Lvl <1.6 (L) 7.0 - 30.0 mg/dL    Comment: Performed at [facility], [place] [address], [place], [place] [address]  Acetaminophen level     Status: Abnormal   Collection Time: 08/27/22  1:23 AM  Result Value Ref Range   Acetaminophen (Tylenol), Serum <10 (L) 10 - 30 ug/mL    Comment: (NOTE) Therapeutic concentrations vary significantly. A range of 10-30 ug/mL  may be an effective concentration for many patients. However, some  are best treated at concentrations outside of this range. Acetaminophen concentrations >150 ug/mL at 4 hours after ingestion  and >50 ug/mL at 12 hours after ingestion are often associated with  toxic reactions.  Performed at [facility], [place] [address] [place], [place] [address]   CBC with Differential     Status: Abnormal   Collection Time: 08/27/22  1:23 AM  Result Value Ref Range   WBC 11.2 (H) 4.0 - 10.5 K/uL   RBC 2.99 (L) 4.22 - 5.81 MIL/uL   Hemoglobin 10.7 (L) 13.0 - 17.0 g/dL   HCT 32.4 (L) 39.0 - 52.0 %   MCV 108.4 (H) 80.0 - 100.0 fL   MCH 35.8 (H) 26.0 - 34.0 pg   MCHC 33.0 30.0 - 36.0 g/dL   RDW 18.5 (H) 11.5 - 15.5 %   Platelets 231 150 - 400 K/uL   nRBC  0.0 0.0 - 0.2 %   Neutrophils Relative % 87 %   Neutro Abs 9.7 (H) 1.7 - 7.7 K/uL   Lymphocytes Relative 7 %   Lymphs Abs 0.8 0.7 - 4.0 K/uL   Monocytes Relative 5 %   Monocytes Absolute 0.6 0.1 - 1.0 K/uL   Eosinophils Relative 0 %   Eosinophils Absolute 0.0 0.0 - 0.5 K/uL   Basophils Relative 0 %   Basophils Absolute 0.0 0.0 - 0.1 K/uL   Immature Granulocytes 1 %   Abs Immature Granulocytes 0.09 (H) 0.00 - 0.07 K/uL    Comment: Performed at [facility], [place] [address] [place], [place] [address]  Ethanol     Status: None   Collection Time: 08/27/22  1:23 AM  Result Value Ref Range   Alcohol, Ethyl (B) <10 <10 mg/dL    Comment: (NOTE) Lowest detectable limit for serum alcohol is 10 mg/dL.  For medical purposes only. Performed at [facility], [place] [address], [place], [place] [address]   Magnesium     Status: Abnormal   Collection Time: 08/27/22  1:23 AM  Result Value Ref Range   Magnesium 0.8 (LL) 1.7 - 2.4 mg/dL    Comment: CRITICAL RESULT CALLED TO, READ BACK BY AND VERIFIED WITH Laquasia Pincus,A RN @ [phone] ON [id number] BY MAHMOUD,S Performed at [facility], [place] [address], [place], [place] [address]   Resp panel by RT-PCR (RSV, Flu A&B, Covid) Anterior Nasal Swab     Status: None   Collection Time: 08/27/22  1:41 AM   Specimen: Anterior Nasal Swab  Result Value Ref Range   SARS Coronavirus 2 by RT PCR NEGATIVE NEGATIVE    Comment: (NOTE) SARS-CoV-2 target nucleic acids are NOT DETECTED.  The SARS-CoV-2 RNA is generally detectable in upper respiratory specimens during the acute phase of infection. The lowest concentration of SARS-CoV-2 viral copies this assay can detect is 138 copies/mL. A negative result does not preclude SARS-Cov-2 infection and should not be used as the sole basis for treatment  or other patient management decisions. A negative result may occur with  improper specimen collection/handling, submission of  specimen other than nasopharyngeal swab, presence of viral mutation(s) within the areas targeted by this assay, and inadequate number of viral copies(<138 copies/mL). A negative result must be combined with clinical observations, patient history, and epidemiological information. The expected result is Negative.  Fact Sheet for Patients:  EntrepreneurPulse.com.au  Fact Sheet for Healthcare Providers:  IncredibleEmployment.be  This test is no t yet approved or cleared by the [place] FDA and  has been authorized for detection and/or diagnosis of SARS-CoV-2 by FDA under an Emergency Use Authorization (EUA). This EUA will remain  in effect (meaning this test can be used) for the duration of the COVID-19 declaration under Section 564(b)(1) of the Act, 21 U.S.C.section 360bbb-3(b)(1), unless the authorization is terminated  or revoked sooner.       Influenza A by PCR NEGATIVE NEGATIVE   Influenza B by PCR NEGATIVE NEGATIVE    Comment: (NOTE) The Xpert Xpress SARS-CoV-2/FLU/RSV plus assay is intended as an aid in the diagnosis of influenza from Nasopharyngeal swab specimens and should not be used as a sole basis for treatment. Nasal washings and aspirates are unacceptable for Xpert Xpress SARS-CoV-2/FLU/RSV testing.  Fact Sheet for Patients: EntrepreneurPulse.com.au  Fact Sheet for Healthcare Providers: IncredibleEmployment.be  This test is not yet approved or cleared by the [place] FDA and has been authorized for detection and/or diagnosis of SARS-CoV-2 by FDA under an Emergency Use Authorization (EUA). This EUA will remain in effect (meaning this test can be used) for the duration of the COVID-19 declaration under Section 564(b)(1) of the Act, 21 U.S.C. section 360bbb-3(b)(1), unless the authorization is terminated or revoked.     Resp Syncytial Virus by PCR NEGATIVE NEGATIVE    Comment:  (NOTE) Fact Sheet for Patients: EntrepreneurPulse.com.au  Fact Sheet for Healthcare Providers: IncredibleEmployment.be  This test is not yet approved or cleared by the [place] FDA and has been authorized for detection and/or diagnosis of SARS-CoV-2 by FDA under an Emergency Use Authorization (EUA). This EUA will remain in effect (meaning this test can be used) for the duration of the COVID-19 declaration under Section 564(b)(1) of the Act, 21 U.S.C. section 360bbb-3(b)(1), unless the authorization is terminated or revoked.  Performed at [facility], [place] [address], [place], [place] [address]   Troponin I (High Sensitivity)     Status: Abnormal   Collection Time: 08/27/22  3:50 AM  Result Value Ref Range   Troponin I (High Sensitivity) 20 (H) <18 ng/L    Comment: (NOTE) Elevated high sensitivity troponin I (hsTnI) values and significant  changes across serial measurements may suggest ACS but many other  chronic and acute conditions are known to elevate hsTnI results.  Refer to the "Links" section for chest pain algorithms and additional  guidance. Performed at [facility], [place] [address], [place], [place] [address]   Blood gas, venous     Status: Abnormal   Collection Time: 08/27/22  3:50 AM  Result Value Ref Range   pH, Ven 7.46 (H) 7.25 - 7.43   pCO2, Ven 31 (L) 44 - 60 mmHg   pO2, Ven 62 (H) 32 - 45 mmHg   Bicarbonate 22.0 20.0 - 28.0 mmol/L   Acid-base deficit 0.9 0.0 - 2.0 mmol/L   O2 Saturation 92.8 %   Patient temperature 37.0     Comment: Performed at [facility], [place] [address]  [place], [place] [address]  D-dimer, quantitative     Status: Abnormal   Collection Time: 08/27/22  3:50 AM  Result Value Ref Range   D-Dimer, Quant 1.82 (H) 0.00 - 0.50 ug/mL-FEU    Comment: (NOTE) At the manufacturer cut-off value of 0.5 g/mL FEU, this assay has a negative predictive  value of 95-100%.This assay is intended for use in conjunction with a clinical pretest probability (PTP) assessment model to exclude pulmonary embolism (PE) and deep venous thrombosis (DVT) in outpatients suspected of PE or DVT. Results should be correlated with clinical presentation. Performed at [facility], [place] [address] [place], [place] [address]   Lactic acid, plasma     Status: None   Collection Time: 08/27/22  6:00 AM  Result Value Ref Range   Lactic Acid, Venous 0.9 0.5 - 1.9 mmol/L    Comment: Performed at [facility], [place] [address], [place], [place] [address]   CT Angio Chest PE W/Cm &/Or Wo Cm  Result Date: 08/27/2022 CLINICAL DATA:  67 year old male with history of positive D-dimer suspected pulmonary embolism. EXAM: CT ANGIOGRAPHY CHEST WITH CONTRAST TECHNIQUE: Multidetector CT imaging of the chest was performed using the standard protocol during bolus administration of intravenous contrast. Multiplanar CT image reconstructions and MIPs were obtained to evaluate the vascular anatomy. RADIATION DOSE REDUCTION: This exam was performed according to the departmental dose-optimization program which includes automated exposure control, adjustment of the mA and/or kV according to patient size and/or use of iterative reconstruction technique. CONTRAST:  3m OMNIPAQUE IOHEXOL 350 MG/ML SOLN COMPARISON:  Chest CTA 03/23/2022. FINDINGS: Cardiovascular: There are no filling defects within the pulmonary arterial tree to suggest pulmonary embolism. Heart size is mildly enlarged. There is no significant pericardial fluid, thickening or pericardial calcification. There is aortic atherosclerosis, as well as atherosclerosis of the great vessels of the mediastinum and the coronary arteries, including calcified atherosclerotic plaque in the left main, left anterior descending and left circumflex coronary arteries. Mediastinum/Nodes: No pathologically enlarged  mediastinal or hilar lymph nodes. Esophagus is unremarkable in appearance. No axillary lymphadenopathy. Lungs/Pleura: Patchy areas of ground-glass attenuation and airspace consolidation are noted in the dependent portions of the lower lobes bilaterally. In this same distribution there are extensive areas of mucoid impaction, most evident in the left lower lobe. No pleural effusions. No definite suspicious appearing pulmonary nodules or masses are noted. Upper Abdomen: Aortic atherosclerosis. Low attenuation throughout the visualized hepatic parenchyma, indicative of a background of hepatic steatosis. Musculoskeletal: Several old healed bilateral posterior rib fractures are incidentally noted. There are no aggressive appearing lytic or blastic lesions noted in the visualized portions of the skeleton. Review of the MIP images confirms the above findings. IMPRESSION: 1. No evidence of pulmonary embolism. 2. Dependent airspace disease in the lower lobes of the lungs bilaterally, the appearance of which is most suggestive of aspiration pneumonitis and developing aspiration pneumonia. Further clinical evaluation is recommended. 3. Aortic atherosclerosis, in addition to left main and 2 vessel coronary artery disease. Please note that although the presence of coronary artery calcium documents the presence of coronary artery disease, the severity of this disease and any potential stenosis cannot be assessed on this non-gated CT examination. Assessment for potential risk factor modification, dietary therapy or pharmacologic therapy may be warranted, if clinically indicated. 4. Mild cardiomegaly. Aortic Atherosclerosis (ICD10-I70.0). Electronically Signed   By: DVinnie LangtonM.D.   On: 08/27/2022 05:10   DG Chest Port 1 View  Result Date: 08/27/2022 CLINICAL DATA:  Shortness of breath EXAM: PORTABLE CHEST 1 VIEW COMPARISON:  07/15/2022 FINDINGS: Mild chronic elevation of the right hemidiaphragm. Heart and mediastinal  contours are within normal limits. No focal opacities or effusions. No acute bony abnormality. IMPRESSION: No active disease. Electronically Signed   By: Rolm Baptise M.D.   On: 08/27/2022 01:10    Pending Labs Unresulted Labs (From admission, onward)     Start     Ordered   08/27/22 0517  Culture, blood (routine x 2)  BLOOD CULTURE X 2,   R (with STAT occurrences)      08/27/22 0517   08/27/22 0517  Lactic acid, plasma  Now then every 2 hours,   R (with STAT occurrences)      08/27/22 0517            Vitals/Pain Today's Vitals   08/27/22 0400 08/27/22 0415 08/27/22 0430 08/27/22 0551  BP: 126/70 121/73 121/61   Pulse: (!) 118 (!) 109 (!) 109   Resp: (!) 30     Temp:    98.5 F (36.9 C)  TempSrc:    Oral  SpO2: 95% 96% 94%     Isolation Precautions Airborne and Contact precautions  Medications Medications  vancomycin (VANCOCIN) IVPB 1000 mg/200 mL premix (1,000 mg Intravenous New Bag/Given 08/27/22 0649)  vancomycin (VANCOREADY) IVPB 750 mg/150 mL (has no administration in time range)  metroNIDAZOLE (FLAGYL) IVPB 500 mg (500 mg Intravenous New Bag/Given 08/27/22 0650)  thiamine (VITAMIN B1) injection 100 mg (100 mg Intravenous Given 08/27/22 0138)  magnesium sulfate IVPB 2 g 50 mL (0 g Intravenous Stopped 08/27/22 0709)  iohexol (OMNIPAQUE) 350 MG/ML injection 80 mL (80 mLs Intravenous Contrast Given 08/27/22 0445)  sodium chloride (PF) 0.9 % injection (  Given by Other 08/27/22 0443)  ceFEPIme (MAXIPIME) 2 g in sodium chloride 0.9 % 100 mL IVPB (0 g Intravenous Stopped 08/27/22 0709)    Mobility walks     Focused Assessments    R Recommendations: See Admitting Provider Note  Report given to:   Additional Notes:

## 2022-08-27 NOTE — Plan of Care (Signed)
  Problem: Education: Goal: Knowledge of General Education information will improve Description Including pain rating scale, medication(s)/side effects and non-pharmacologic comfort measures Outcome: Progressing   Problem: Health Behavior/Discharge Planning: Goal: Ability to manage health-related needs will improve Outcome: Progressing   

## 2022-08-27 NOTE — ED Triage Notes (Signed)
Pt BIB EMS after found laying on the ground and c/o SOB. Per EMS, pt has been drinking.   O2 96%

## 2022-08-27 NOTE — ED Provider Notes (Signed)
[place] EMERGENCY DEPARTMENT AT [facility] Provider Note   CSN: [id number] Arrival date & time: 08/27/22  0007     History  Chief Complaint  Patient presents with   Shortness of Breath    Anthony [name] is a 67 y.o. male.  The history is provided by the patient and medical records.  Shortness of Breath Anthony [name] is a 67 y.o. male who presents to the Emergency Department complaining of shortness of breath.  Level 5 caveat due to confusion and vague historian.  He presents to the emergency department by EMS for shortness of breath.  He is unable to clarify how long this has been going on.  He does complain of cough productive of sputum as well as bilateral lower extremity edema.  No complaints of chest pain or abdominal pain.  No nausea, vomiting.  He does drink alcohol and takes acetaminophen and salicylate regularly.  He does not know how much he takes of any of these things.  He is unsure if he takes prescription medications or not.     Home Medications Prior to Admission medications   Medication Sig Start Date End Date Taking? Authorizing Provider  acetaminophen (TYLENOL) 500 MG tablet Take 500-1,000 mg by mouth every 6 (six) hours as needed (for pain, headaches, or dental pain).    [provider]  amiodarone (PACERONE) 200 MG tablet TAKE 1 TABLET (200 MG TOTAL) BY MOUTH DAILY. Patient not taking: Reported on 07/23/2022 03/25/22 03/25/23  Geradine Girt, DO  ascorbic acid (VITAMIN C) 500 MG tablet Take 500 mg by mouth daily as needed (for supplementation). Patient not taking: Reported on 07/23/2022    [provider]  aspirin EC 325 MG tablet Take 325 mg by mouth every 6 (six) hours as needed (for pain, headaches, or dental pain).    [provider]  B Complex-Folic Acid (B COMPLEX PLUS) TABS Take 1 tablet by mouth daily as needed (for supplementation). Patient not taking: Reported on 07/23/2022    [provider]  CALCIUM PO Take 1  tablet by mouth 2 (two) times a week. Patient not taking: Reported on 07/23/2022    [provider]  Cholecalciferol (VITAMIN D3 PO) Take 1 capsule by mouth daily as needed (for supplementation). Patient not taking: Reported on 07/23/2022    [provider]  diclofenac Sodium (VOLTAREN) 1 % GEL Apply 2 g topically 2 (two) times daily as needed (for pain). Patient not taking: Reported on 07/23/2022 06/02/22   Arlyce Dice, MD  ferrous sulfate 325 (65 FE) MG tablet Take 1 tablet (325 mg total) by mouth daily with breakfast. Patient not taking: Reported on 07/23/2022 06/03/22   Arlyce Dice, MD  folic acid (FOLVITE) 1 MG tablet Take 1 tablet (1 mg total) by mouth daily. Patient not taking: Reported on 07/23/2022 03/20/22   Sharion Settler, DO  loratadine (CLARITIN) 10 MG tablet TAKE 1 TABLET (10 MG TOTAL) BY MOUTH DAILY. Patient not taking: Reported on 07/23/2022 10/08/20 07/23/22  Lilland, Lorrin Goodell, DO  methocarbamol (ROBAXIN) 750 MG tablet Take 1 tablet (750 mg total) by mouth every 8 (eight) hours as needed for muscle spasms. Patient not taking: Reported on 07/23/2022 06/02/22   Arlyce Dice, MD  metoprolol succinate (TOPROL-XL) 25 MG 24 hr tablet Take 0.5 tablets (12.5 mg total) by mouth daily. Patient not taking: Reported on 07/23/2022 06/03/22   Arlyce Dice, MD  metoprolol tartrate (LOPRESSOR) 25 MG tablet Take by mouth. Patient not taking: Reported  on 07/23/2022 06/15/22   [provider]  mometasone-formoterol (DULERA) 100-5 MCG/ACT AERO INHALE 2 PUFFS INTO THE LUNGS TWO TIMES DAILY. Patient not taking: Reported on 07/23/2022 03/19/22 03/19/23  Sharion Settler, DO  Multiple Vitamin (MULTIVITAMIN WITH MINERALS) TABS tablet Take 1 tablet by mouth daily. Patient not taking: Reported on 07/23/2022 06/03/22   Arlyce Dice, MD  Nystatin (GERHARDT'S BUTT CREAM) CREA Apply 1 Application topically as needed for irritation. Patient not taking: Reported on 07/23/2022 06/02/22   Arlyce Dice, MD   nystatin (MYCOSTATIN/NYSTOP) powder Apply topically as needed (irritation). Patient not taking: Reported on 07/23/2022 06/02/22   Arlyce Dice, MD  polyethylene glycol powder (GLYCOLAX/MIRALAX) 17 GM/SCOOP powder Take 17 g by mouth daily. (Mix with 8 ounces of liquid as directed) 07/24/22   Leanord Asal K, DO  psyllium (METAMUCIL) 0.52 g capsule Take 1 capsule (0.52 g total) by mouth daily. 07/24/22   Leanord Asal K, DO  rosuvastatin (CRESTOR) 10 MG tablet Take 1 tablet (10 mg total) by mouth daily. Patient not taking: Reported on 07/23/2022 06/03/22   Arlyce Dice, MD  sertraline (ZOLOFT) 50 MG tablet Take 50 mg by mouth daily. Patient not taking: Reported on 07/23/2022    [provider]  thiamine (VITAMIN B1) 100 MG tablet Take 1 tablet (100 mg total) by mouth daily. Patient not taking: Reported on 07/23/2022 03/20/22   Sharion Settler, DO      Allergies    Other and Poison ivy extract    Review of Systems   Review of Systems  Respiratory:  Positive for shortness of breath.   All other systems reviewed and are negative.   Physical Exam Updated Vital Signs BP 121/61   Pulse (!) 109   Temp 98.5 F (36.9 C) (Oral)   Resp (!) 30   SpO2 94%  Physical Exam Vitals and nursing note reviewed.  Constitutional:      Appearance: He is well-developed.     Comments: Drowsy.  Awakens to verbal stimuli  HENT:     Head: Normocephalic and atraumatic.  Cardiovascular:     Rate and Rhythm: Regular rhythm. Tachycardia present.     Heart sounds: No murmur heard. Pulmonary:     Effort: Pulmonary effort is normal. No respiratory distress.     Breath sounds: Normal breath sounds.  Abdominal:     Palpations: Abdomen is soft.     Tenderness: There is no abdominal tenderness. There is no guarding or rebound.  Musculoskeletal:        General: No tenderness.     Comments: 3+ pitting edema to bilateral lower extremities  Skin:    General: Skin is warm and dry.  Neurological:      Comments: Drowsy but awakens to verbal stimuli.  Moves all extremities symmetrically.  Oriented to person and hospital.  Disoriented to recent events.  Psychiatric:        Behavior: Behavior normal.     ED Results / Procedures / Treatments   Labs (all labs ordered are listed, but only abnormal results are displayed) Labs Reviewed  COMPREHENSIVE METABOLIC PANEL - Abnormal; Notable for the following components:      Result Value   Sodium 132 (*)    CO2 17 (*)    BUN 5 (*)    Calcium 7.8 (*)    Albumin 2.7 (*)    All other components within normal limits  BRAIN NATRIURETIC PEPTIDE - Abnormal; Notable for the following components:   B Natriuretic Peptide 529.3 (*)  All other components within normal limits  SALICYLATE LEVEL - Abnormal; Notable for the following components:   Salicylate Lvl <1.5 (*)    All other components within normal limits  ACETAMINOPHEN LEVEL - Abnormal; Notable for the following components:   Acetaminophen (Tylenol), Serum <10 (*)    All other components within normal limits  CBC WITH DIFFERENTIAL/PLATELET - Abnormal; Notable for the following components:   WBC 11.2 (*)    RBC 2.99 (*)    Hemoglobin 10.7 (*)    HCT 32.4 (*)    MCV 108.4 (*)    MCH 35.8 (*)    RDW 18.5 (*)    Neutro Abs 9.7 (*)    Abs Immature Granulocytes 0.09 (*)    All other components within normal limits  MAGNESIUM - Abnormal; Notable for the following components:   Magnesium 0.8 (*)    All other components within normal limits  BLOOD GAS, VENOUS - Abnormal; Notable for the following components:   pH, Ven 7.46 (*)    pCO2, Ven 31 (*)    pO2, Ven 62 (*)    All other components within normal limits  D-DIMER, QUANTITATIVE - Abnormal; Notable for the following components:   D-Dimer, Quant 1.82 (*)    All other components within normal limits  TROPONIN I (HIGH SENSITIVITY) - Abnormal; Notable for the following components:   Troponin I (High Sensitivity) 19 (*)    All other  components within normal limits  TROPONIN I (HIGH SENSITIVITY) - Abnormal; Notable for the following components:   Troponin I (High Sensitivity) 20 (*)    All other components within normal limits  RESP PANEL BY RT-PCR (RSV, FLU A&B, COVID)  RVPGX2  CULTURE, BLOOD (ROUTINE X 2)  CULTURE, BLOOD (ROUTINE X 2)  ETHANOL  LACTIC ACID, PLASMA  LACTIC ACID, PLASMA    EKG EKG Interpretation  Date/Time:  Thursday August 27 2022 01:37:15 EST Ventricular Rate:  116 PR Interval:  151 QRS Duration: 85 QT Interval:  354 QTC Calculation: 492 R Axis:   86 Text Interpretation: Sinus tachycardia Borderline right axis deviation Borderline prolonged QT interval Confirmed by Quintella Reichert [id number]) on 08/27/2022 1:44:13 AM  Radiology CT Angio Chest PE W/Cm &/Or Wo Cm  Result Date: 08/27/2022 CLINICAL DATA:  67 year old male with history of positive D-dimer suspected pulmonary embolism. EXAM: CT ANGIOGRAPHY CHEST WITH CONTRAST TECHNIQUE: Multidetector CT imaging of the chest was performed using the standard protocol during bolus administration of intravenous contrast. Multiplanar CT image reconstructions and MIPs were obtained to evaluate the vascular anatomy. RADIATION DOSE REDUCTION: This exam was performed according to the departmental dose-optimization program which includes automated exposure control, adjustment of the mA and/or kV according to patient size and/or use of iterative reconstruction technique. CONTRAST:  59m OMNIPAQUE IOHEXOL 350 MG/ML SOLN COMPARISON:  Chest CTA 03/23/2022. FINDINGS: Cardiovascular: There are no filling defects within the pulmonary arterial tree to suggest pulmonary embolism. Heart size is mildly enlarged. There is no significant pericardial fluid, thickening or pericardial calcification. There is aortic atherosclerosis, as well as atherosclerosis of the great vessels of the mediastinum and the coronary arteries, including calcified atherosclerotic plaque in the left main,  left anterior descending and left circumflex coronary arteries. Mediastinum/Nodes: No pathologically enlarged mediastinal or hilar lymph nodes. Esophagus is unremarkable in appearance. No axillary lymphadenopathy. Lungs/Pleura: Patchy areas of ground-glass attenuation and airspace consolidation are noted in the dependent portions of the lower lobes bilaterally. In this same distribution there are extensive areas of mucoid impaction, most  evident in the left lower lobe. No pleural effusions. No definite suspicious appearing pulmonary nodules or masses are noted. Upper Abdomen: Aortic atherosclerosis. Low attenuation throughout the visualized hepatic parenchyma, indicative of a background of hepatic steatosis. Musculoskeletal: Several old healed bilateral posterior rib fractures are incidentally noted. There are no aggressive appearing lytic or blastic lesions noted in the visualized portions of the skeleton. Review of the MIP images confirms the above findings. IMPRESSION: 1. No evidence of pulmonary embolism. 2. Dependent airspace disease in the lower lobes of the lungs bilaterally, the appearance of which is most suggestive of aspiration pneumonitis and developing aspiration pneumonia. Further clinical evaluation is recommended. 3. Aortic atherosclerosis, in addition to left main and 2 vessel coronary artery disease. Please note that although the presence of coronary artery calcium documents the presence of coronary artery disease, the severity of this disease and any potential stenosis cannot be assessed on this non-gated CT examination. Assessment for potential risk factor modification, dietary therapy or pharmacologic therapy may be warranted, if clinically indicated. 4. Mild cardiomegaly. Aortic Atherosclerosis (ICD10-I70.0). Electronically Signed   By: Vinnie Langton M.D.   On: 08/27/2022 05:10   DG Chest Port 1 View  Result Date: 08/27/2022 CLINICAL DATA:  Shortness of breath EXAM: PORTABLE CHEST 1 VIEW  COMPARISON:  07/15/2022 FINDINGS: Mild chronic elevation of the right hemidiaphragm. Heart and mediastinal contours are within normal limits. No focal opacities or effusions. No acute bony abnormality. IMPRESSION: No active disease. Electronically Signed   By: Rolm Baptise M.D.   On: 08/27/2022 01:10    Procedures Procedures   CRITICAL CARE Performed by: Quintella Reichert   Total critical care time: 40 minutes  Critical care time was exclusive of separately billable procedures and treating other patients.  Critical care was necessary to treat or prevent imminent or life-threatening deterioration.  Critical care was time spent personally by me on the following activities: development of treatment plan with patient and/or surrogate as well as nursing, discussions with consultants, evaluation of patient's response to treatment, examination of patient, obtaining history from patient or surrogate, ordering and performing treatments and interventions, ordering and review of laboratory studies, ordering and review of radiographic studies, pulse oximetry and re-evaluation of patient's condition.  Medications Ordered in ED Medications  ceFEPIme (MAXIPIME) 2 g in sodium chloride 0.9 % 100 mL IVPB (2 g Intravenous New Bag/Given 08/27/22 0604)  vancomycin (VANCOCIN) IVPB 1000 mg/200 mL premix (has no administration in time range)  vancomycin (VANCOREADY) IVPB 750 mg/150 mL (has no administration in time range)  metroNIDAZOLE (FLAGYL) IVPB 500 mg (has no administration in time range)  thiamine (VITAMIN B1) injection 100 mg (100 mg Intravenous Given 08/27/22 0138)  magnesium sulfate IVPB 2 g 50 mL (2 g Intravenous New Bag/Given 08/27/22 0336)  iohexol (OMNIPAQUE) 350 MG/ML injection 80 mL (80 mLs Intravenous Contrast Given 08/27/22 0445)  sodium chloride (PF) 0.9 % injection (  Given by Other 08/27/22 0443)    ED Course/ Medical Decision Making/ A&P                             Medical Decision Making Amount  and/or Complexity of Data Reviewed Labs: ordered. Radiology: ordered.  Risk Prescription drug management. Decision regarding hospitalization.  Patient with history of paroxysmal atrial fibrillation, alcohol abuse here for evaluation of shortness of breath.  Patient is mild to moderately confused on examination and cannot provide very useful history but does complain of shortness  of breath, cough productive of sputum as well as lower extremity edema.  He is nontoxic-appearing on evaluation but is tachycardic with good air movement bilaterally.  He does have pitting edema to bilateral lower extremities.  Given his recent hospitalization at [facility] a D-dimer was obtained which was elevated.  A CTA was obtained, which is negative for PE but does demonstrate bilateral lower lobe opacities concerning for possible aspiration pneumonia.  Given his tachycardia and shortness of breath will start on antibiotics.  Labs also significant for profound hypomag.  This was treated with IV magnesium replacement.  Discussed with patient findings of studies and recommendation for admission for ongoing care and he is in agreement with plan.  Medicine consulted for admission.         Final Clinical Impression(s) / ED Diagnoses Final diagnoses:  SOB (shortness of breath)  Aspiration pneumonia of both lower lobes, unspecified aspiration pneumonia type ([place])  Hypomagnesemia    Rx / DC Orders ED Discharge Orders     None         Quintella Reichert, MD 08/27/22 [phone]

## 2022-08-27 NOTE — H&P (Addendum)
History and Physical    Patient: Anthony [name] KXF:[id number] DOB: 23-May-1956 DOA: 08/27/2022 DOS: the patient was seen and examined on 08/27/2022 PCP: Anthony Grip, MD  Patient coming from: Home  Chief Complaint:  Chief Complaint  Patient presents with   Shortness of Breath   HPI: Anthony [name] is a 67 y.o. 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  However, shortness of breath has been worse over the last few days.  No chest pain, diaphoresis, orthopnea or PND.  He gets occasional palpitations.  He is currently not taking his any of his medications.  He drinks 84 ounces of beer daily. He denied fever, chills, rhinorrhea, sore throat, wheezing or hemoptysis. No abdominal pain, nausea, emesis, recent diarrhea,  melena or hematochezia.  He has occasional constipation.  Has been having frequency, but no flank pain, dysuria or hematuria.  No polyuria, polydipsia, polyphagia or blurred vision.   Lab work: His CBCs are white count 11.2, hemoglobin 10.7 g/dL with an MCV of 108.4 fL and platelets 231.  D-dimer 1.82.  Alcohol level, salicylate level and acetaminophen were below detectable levels.  Troponin was 19 then 29 mg/L and BNP 529.3 pg/mL.  Lactic acid was normal.  Venous blood gas with a pH of 7.46, pCO2 of 31 and pO2 of 62 mmHg.  The rest of the VBG results were normal.  CMP showed a sodium 132, potassium 3.7, chloride 102 and CO2 17 mmol/L.  Calcium  7.8, magnesium 0.8, glucose 73, BUN 5 and creatinine 0.62 mg/dL.  LFTs were normal SF4 and albumin level 2.7 g/dL.  Coronavirus, RSV and influenza PCR negative.  Imaging: Portable 1 view chest radiograph with no active disease.  CTA chest no evidence of PE.  There is dependent airspace disease in the lower lobes of the lungs bilaterally, the appearance of which is most suggestive of aspiration pneumonitis and developing aspiration pneumonia.  Aortic atherosclerosis in addition to left main and two-vessel coronary disease.  Mild cardiomegaly.  ED course: Initial vital signs were temperature 98.4 F, pulse 120, respiration 16, BP 149/96 mmHg O2 sat 99% on room air.  The patient received cefepime 2 g IVPB, magnesium sulfate 2 g IVPB, metronidazole 500 mg IVPB, vancomycin 1000 mg IVPB and thiamine 100 mg IVP.   Review of Systems: As mentioned in the history of present illness. All other systems reviewed and are negative. Past Medical History:  Diagnosis Date   Actinic keratosis 11/27/2016   Alcohol abuse with alcohol-induced mood disorder ([place]) 07/18/2018   Alcohol use disorder, severe, dependence ([place]) 09/16/2006   05/22/2015 Argumentative, belligerent and verbally abusive to staff per his note from [facility])    Anxiety state 04/25/2018   Chronic low back pain 09/16/2006   Followed by NS.  Per his chart from [place], he fell off two storyhouse roof while cleaning his brother's gutters and sustained compression fracture of L3, L4 and L5 and fracture of right transverse process at L3 and nondisplaced fracture of left glenoid rim many years ago. He had multiple imaging in [place] including Lumbar  MRI in 2016 which showed moderate to severe spinal canal stenos   Delirium tremens ([place]) 09/01/2017   Depression    Dry eye 11/23/2016   Foreign body in middle portion of esophagus 05/19/2022   Hiatal hernia 09/14/2016   Status Collis gastroplasty and Nissen's fundoplication  on 33/29/5188 in [place]   History of delirium tremons 07/22/2020   DTs during 10/2016 08/2017. And 12/2017 admissions    History of pulmonary embolus (PE) 11/02/2016   Unprovoked 11/01/2016. Xarelto from 11/01/16 to 09/08/2017.   Hydrocele    Surgically corrected   Hypertension    Hypoalbuminemia due to protein-calorie malnutrition ([place]) 07/22/2020   Lumbar compression fracture (HCC)    Multiple rib fractures 07/22/2019   Left rib details XR: left ribs demonstrate multiple remote rib   Pancytopenia ([place])    Rhabdomyolysis 07/22/2020   Skin cancer    exciced 2017   Tinea versicolor 06/08/2013   Tobacco use disorder 07/22/2020   Tooth infection 04/25/2018   Trigger finger, acquired 11/18/2012   Ulnar tunnel syndrome of right wrist 11/08/2012   Past Surgical History:  Procedure Laterality Date   BIOPSY  05/21/2022   Procedure: BIOPSY;  Surgeon: [name], MD;  Location: [facility] ENDOSCOPY;  Service: Gastroenterology;;   CATARACT EXTRACTION     ESOPHAGOGASTRODUODENOSCOPY (EGD) WITH PROPOFOL N/A 05/21/2022   Procedure: ESOPHAGOGASTRODUODENOSCOPY (EGD) WITH PROPOFOL;  Surgeon: [name], MD;  Location: [place];  Service: Gastroenterology;  Laterality: N/A;   FOREIGN BODY REMOVAL N/A 05/21/2022   Procedure: FOREIGN BODY REMOVAL;  Surgeon: [name], MD;  Location: [place];  Service: Gastroenterology;  Laterality: N/A;   HIATAL HERNIA REPAIR  2016   HYDROCELE EXCISION / REPAIR  2010   SKIN CANCER EXCISION Left    Excised 2017   Social History:  reports that he has never smoked. His smokeless tobacco use includes snuff. He reports current alcohol use of about 43.0 standard drinks of alcohol per week. He reports current drug use. Drug: Marijuana.  Allergies  Allergen Reactions   Other Itching and Other (See Comments)    Seasonal allergies- Itchy eyes, runny nose, congestion   Poison Ivy Extract Rash    Family History  Problem Relation Age of Onset   Heart attack  Father        died at 43 years   Dementia Father    Stroke Father    Cancer Sister    Colon cancer Neg Hx    Colon polyps Neg Hx    Esophageal cancer Neg Hx    Stomach cancer Neg Hx    Rectal cancer Neg Hx     Prior to Admission medications   Medication Sig Start Date End Date Taking? Authorizing Provider  acetaminophen (TYLENOL) 500 MG tablet Take 500-1,000 mg by mouth every 6 (six) hours as needed (for pain, headaches, or dental pain).    [provider]  amiodarone (PACERONE) 200 MG tablet TAKE 1 TABLET (200 MG TOTAL) BY MOUTH DAILY. Patient not taking: Reported on 07/23/2022 03/25/22 03/25/23  Geradine Girt, DO  ascorbic acid (VITAMIN C) 500 MG tablet Take 500 mg by mouth daily as needed (for supplementation). Patient not taking: Reported on 07/23/2022    [provider]  aspirin EC 325 MG tablet Take 325 mg by mouth every 6 (six) hours as needed (for pain, headaches, or dental pain).    [provider]  B Complex-Folic Acid (B COMPLEX PLUS) TABS Take 1 tablet by mouth daily as needed (for supplementation).  Patient not taking: Reported on 07/23/2022    [provider]  CALCIUM PO Take 1 tablet by mouth 2 (two) times a week. Patient not taking: Reported on 07/23/2022    [provider]  Cholecalciferol (VITAMIN D3 PO) Take 1 capsule by mouth daily as needed (for supplementation). Patient not taking: Reported on 07/23/2022    [provider]  diclofenac Sodium (VOLTAREN) 1 % GEL Apply 2 g topically 2 (two) times daily as needed (for pain). Patient not taking: Reported on 07/23/2022 06/02/22   Arlyce Dice, MD  ferrous sulfate 325 (65 FE) MG tablet Take 1 tablet (325 mg total) by mouth daily with breakfast. Patient not taking: Reported on 07/23/2022 06/03/22   Arlyce Dice, MD  folic acid (FOLVITE) 1 MG tablet Take 1 tablet (1 mg total) by mouth daily. Patient not taking: Reported on 07/23/2022 03/20/22   Sharion Settler, DO  loratadine  (CLARITIN) 10 MG tablet TAKE 1 TABLET (10 MG TOTAL) BY MOUTH DAILY. Patient not taking: Reported on 07/23/2022 10/08/20 07/23/22  Lilland, Lorrin Goodell, DO  methocarbamol (ROBAXIN) 750 MG tablet Take 1 tablet (750 mg total) by mouth every 8 (eight) hours as needed for muscle spasms. Patient not taking: Reported on 07/23/2022 06/02/22   Arlyce Dice, MD  metoprolol succinate (TOPROL-XL) 25 MG 24 hr tablet Take 0.5 tablets (12.5 mg total) by mouth daily. Patient not taking: Reported on 07/23/2022 06/03/22   Arlyce Dice, MD  metoprolol tartrate (LOPRESSOR) 25 MG tablet Take by mouth. Patient not taking: Reported on 07/23/2022 06/15/22   [provider]  mometasone-formoterol (DULERA) 100-5 MCG/ACT AERO INHALE 2 PUFFS INTO THE LUNGS TWO TIMES DAILY. Patient not taking: Reported on 07/23/2022 03/19/22 03/19/23  Sharion Settler, DO  Multiple Vitamin (MULTIVITAMIN WITH MINERALS) TABS tablet Take 1 tablet by mouth daily. Patient not taking: Reported on 07/23/2022 06/03/22   Arlyce Dice, MD  Nystatin (GERHARDT'S BUTT CREAM) CREA Apply 1 Application topically as needed for irritation. Patient not taking: Reported on 07/23/2022 06/02/22   Arlyce Dice, MD  nystatin (MYCOSTATIN/NYSTOP) powder Apply topically as needed (irritation). Patient not taking: Reported on 07/23/2022 06/02/22   Arlyce Dice, MD  polyethylene glycol powder (GLYCOLAX/MIRALAX) 17 GM/SCOOP powder Take 17 g by mouth daily. (Mix with 8 ounces of liquid as directed) 07/24/22   Leanord Asal K, DO  psyllium (METAMUCIL) 0.52 g capsule Take 1 capsule (0.52 g total) by mouth daily. 07/24/22   Leanord Asal K, DO  rosuvastatin (CRESTOR) 10 MG tablet Take 1 tablet (10 mg total) by mouth daily. Patient not taking: Reported on 07/23/2022 06/03/22   Arlyce Dice, MD  sertraline (ZOLOFT) 50 MG tablet Take 50 mg by mouth daily. Patient not taking: Reported on 07/23/2022    [provider]  thiamine (VITAMIN B1) 100 MG tablet Take 1 tablet (100 mg  total) by mouth daily. Patient not taking: Reported on 07/23/2022 03/20/22   Sharion Settler, DO    Physical Exam: Vitals:   08/27/22 0700 08/27/22 0715 08/27/22 0730 08/27/22 0745  BP: 117/71 126/71 113/67 125/64  Pulse: (!) 101 (!) 102 100 (!) 101  Resp:    20  Temp:    98.1 F (36.7 C)  TempSrc:    Oral  SpO2: 96% 94% 96% 95%   Physical Exam Vitals and nursing note reviewed.  Constitutional:      General: He is awake.     Appearance: He is well-developed and normal weight.  HENT:     Head: Normocephalic.  Nose: No rhinorrhea.     Mouth/Throat:     Mouth: Mucous membranes are moist.  Eyes:     General: No scleral icterus.    Pupils: Pupils are equal, round, and reactive to light.  Neck:     Vascular: No JVD.  Cardiovascular:     Rate and Rhythm: Normal rate and regular rhythm.     Heart sounds: S1 normal and S2 normal.  Pulmonary:     Effort: No tachypnea or respiratory distress.     Breath sounds: Examination of the right-lower field reveals rales. Examination of the left-lower field reveals rales. Rhonchi and rales present. No wheezing.  Abdominal:     General: Bowel sounds are normal. There is no distension.     Palpations: Abdomen is soft.     Tenderness: There is no abdominal tenderness.  Musculoskeletal:     Cervical back: Neck supple.     Lumbar back: Tenderness present. Decreased range of motion.     Right lower leg: 1+ Edema present.     Left lower leg: 1+ Edema present.     Comments: Bilateral paraspinal muscle tenderness on palpation.  Skin:    General: Skin is warm and dry.  Neurological:     General: No focal deficit present.     Mental Status: He is alert.  Psychiatric:        Mood and Affect: Mood normal.        Behavior: Behavior normal. Behavior is cooperative.   Data Reviewed:  Results are pending, will review when available.  03/17/2022 echocardiogram IMPRESSIONS:   1. Left ventricular ejection fraction, by estimation, is 55 to  60%. The  left ventricle has normal function. The left ventricle has no regional  wall motion abnormalities. Left ventricular diastolic parameters are  consistent with Grade III diastolic  dysfunction (restrictive). Elevated left atrial pressure.   2. Right ventricular systolic function is normal. The right ventricular  size is mildly enlarged. There is normal pulmonary artery systolic  pressure. The estimated right ventricular systolic pressure is 16.1 mmHg.   3. Left atrial size was mildly dilated.   4. The mitral valve is abnormal. Mild to moderate mitral valve  regurgitation. No evidence of mitral stenosis.   5. The aortic valve is tricuspid. There is mild calcification of the  aortic valve. There is mild thickening of the aortic valve. Aortic valve  regurgitation is not visualized. No aortic stenosis is present.   6. The inferior vena cava is normal in size with greater than 50%  respiratory variability, suggesting right atrial pressure of 3 mmHg.   Assessment and Plan: Principal Problem:   Aspiration pneumonia ([place]) Admit to telemetry/inpatient. Continue supplemental oxygen. As needed bronchodilators. Zosyn 3.375 g IVPB every 8 hours. Check strep pneumoniae urinary antigen. Check sputum Gram stain, culture and sensitivity. Follow-up blood culture and sensitivity. Follow-up CBC and chemistry in the morning.  Active Problems:   Alcohol use disorder, severe, dependence ([place]) Not intending to quit at the moment. Lorazepam as needed by CIWA protocol while in the hospital. Folate, MVI and thiamine supplementation. Follow-up magnesium and check phosphorus in AM. Consult transition of care team.    Acute on chronic diastolic CHF (congestive heart failure) ([place]) Supplemental oxygen as needed. Sodium and fluid restriction. Continue furosemide 20 mg IVP twice daily. Resume metoprolol 12.5 mg p.o. twice daily. Begin lisinopril 2.5 mg p.o. daily. Monitor daily weights, intake and  output. Monitor renal function and electrolytes.    Tobacco use disorder  Does not smoke. He chews tobacco occasionally. Has not been using recently.    Paroxysmal atrial fibrillation (HCC) CHA2DS2-VASc Score of at least 4. Rate is currently controlled. Not on anticoagulation. Not a good candidate to start. History of noncompliance and alcohol abuse. Will not resume amiodarone for same reasons. Resume metoprolol and aspirin. Keep electrolytes optimized.    Dyslipidemia/aortic atherosclerosis. Will not resume rosuvastatin due to EtOH use/noncompliance.    Hypomagnesemia Replacement given in the ED. Check magnesium level in the morning.    HTN (hypertension) Resume metoprolol as above. ACE inhibitor as earlier mention. Monitor BP, HR, renal function and electrolytes.    Macrocytic anemia In the setting of alcohol abuse. Folate, MVI and thiamine supplementation. Follow-up hematocrit and hemoglobin in the morning.    History of pulmonary embolus (PE) No PE on CTA. SCDs for DVT prophylaxis.    Malnutrition of moderate degree In the setting of alcohol abuse. Alcohol cessation advised. Protein supplementation.    Poor social situation   Depression No SI or HI. TOC has been consulted.    Hiatal hernia Famotidine 20 mg p.o. twice daily.    Chronic back pain Resume methocarbamol 750 mg p.o. TID PRN.    Advance Care Planning:   Code Status: Full Code   Consults:   Family Communication:   Severity of Illness: The appropriate patient status for this patient is INPATIENT. Inpatient status is judged to be reasonable and necessary in order to provide the required intensity of service to ensure the patient's safety. The patient's presenting symptoms, physical exam findings, and initial radiographic and laboratory data in the context of their chronic comorbidities is felt to place them at high risk for further clinical deterioration. Furthermore, it is not anticipated that  the patient will be medically stable for discharge from the hospital within 2 midnights of admission.   * I certify that at the point of admission it is my clinical judgment that the patient will require inpatient hospital care spanning beyond 2 midnights from the point of admission due to high intensity of service, high risk for further deterioration and high frequency of surveillance required.*  Author: Reubin Milan, MD 08/27/2022 8:05 AM  For on call review [email or web address].   This document was prepared using Dragon voice recognition software and may contain some unintended transcription errors.

## 2022-08-27 NOTE — Progress Notes (Addendum)
Pharmacy Antibiotic Note  Anthony [name] is a 67 y.o. male admitted on 08/27/2022 with SOB.  Pharmacy has been consulted to dose vancomycin for pna  Plan: Vancomycin 1gm IV x 1 then '750mg'$  IV q12h (AUC 455, Scr 0.8, TBW) Follow renal function and clinical course     Temp (24hrs), Avg:98.4 F (36.9 C), Min:98.4 F (36.9 C), [place]ell:98.4 F (36.9 C)  Recent Labs  Lab 08/27/22 0123  WBC 11.2*  CREATININE 0.62    Estimated Creatinine Clearance: 81.6 mL/min (by C-G formula based on SCr of 0.62 mg/dL).    Allergies  Allergen Reactions   Other Itching and Other (See Comments)    Seasonal allergies- Itchy eyes, runny nose, congestion   Poison Ivy Extract Rash    Antimicrobials this admission: 2/8 vanc >> 2/8 cefepime x 1  Dose adjustments this admission:   Microbiology results: 2/8 BCx:   Thank you for allowing pharmacy to be a part of this patient's care.  Dolly Rias RPh 08/27/2022, 5:21 AM

## 2022-08-28 ENCOUNTER — Inpatient Hospital Stay (HOSPITAL_COMMUNITY): Payer: 59

## 2022-08-28 DIAGNOSIS — I5033 Acute on chronic diastolic (congestive) heart failure: Secondary | ICD-10-CM | POA: Diagnosis not present

## 2022-08-28 DIAGNOSIS — G9341 Metabolic encephalopathy: Secondary | ICD-10-CM | POA: Diagnosis not present

## 2022-08-28 DIAGNOSIS — F10931 Alcohol use, unspecified with withdrawal delirium: Secondary | ICD-10-CM | POA: Diagnosis not present

## 2022-08-28 DIAGNOSIS — J690 Pneumonitis due to inhalation of food and vomit: Secondary | ICD-10-CM | POA: Diagnosis not present

## 2022-08-28 LAB — COMPREHENSIVE METABOLIC PANEL
ALT: 14 U/L (ref 0–44)
AST: 32 U/L (ref 15–41)
Albumin: 2.4 g/dL — ABNORMAL LOW (ref 3.5–5.0)
Alkaline Phosphatase: 92 U/L (ref 38–126)
Anion gap: 7 (ref 5–15)
BUN: 11 mg/dL (ref 8–23)
CO2: 23 mmol/L (ref 22–32)
Calcium: 7.6 mg/dL — ABNORMAL LOW (ref 8.9–10.3)
Chloride: 100 mmol/L (ref 98–111)
Creatinine, Ser: 0.64 mg/dL (ref 0.61–1.24)
GFR, Estimated: >60 mL/min (ref >60)
Glucose, Bld: 82 mg/dL (ref 70–99)
Potassium: 3.4 mmol/L — ABNORMAL LOW (ref 3.5–5.1)
Sodium: 130 mmol/L — ABNORMAL LOW (ref 135–145)
Total Bilirubin: 1.1 mg/dL (ref 0.3–1.2)
Total Protein: 6.4 g/dL — ABNORMAL LOW (ref 6.5–8.1)

## 2022-08-28 LAB — PHOSPHORUS: Phosphorus: 1.9 mg/dL — ABNORMAL LOW (ref 2.5–4.6)

## 2022-08-28 LAB — CBC
HCT: 32.3 % — ABNORMAL LOW (ref 39.0–52.0)
Hemoglobin: 10.4 g/dL — ABNORMAL LOW (ref 13.0–17.0)
MCH: 36 pg — ABNORMAL HIGH (ref 26.0–34.0)
MCHC: 32.2 g/dL (ref 30.0–36.0)
MCV: 111.8 fL — ABNORMAL HIGH (ref 80.0–100.0)
Platelets: 175 10*3/uL (ref 150–400)
RBC: 2.89 MIL/uL — ABNORMAL LOW (ref 4.22–5.81)
RDW: 17.5 % — ABNORMAL HIGH (ref 11.5–15.5)
WBC: 8.1 10*3/uL (ref 4.0–10.5)
nRBC: 0 % (ref 0.0–0.2)

## 2022-08-28 LAB — ECHOCARDIOGRAM COMPLETE
Area-P 1/2: 3.5 cm2
S' Lateral: 3.7 cm
Weight: 2144.63 oz

## 2022-08-28 LAB — MAGNESIUM: Magnesium: 1.4 mg/dL — ABNORMAL LOW (ref 1.7–2.4)

## 2022-08-28 MED ORDER — MAGNESIUM SULFATE 4 GM/100ML IV SOLN
4.0000 g | Freq: Once | INTRAVENOUS | Status: AC
  Administered 2022-08-28: 4 g via INTRAVENOUS
  Filled 2022-08-28: qty 100

## 2022-08-28 MED ORDER — LORAZEPAM 2 MG/ML IJ SOLN
0.0000 mg | Freq: Four times a day (QID) | INTRAMUSCULAR | Status: DC
  Administered 2022-08-28 – 2022-08-29 (×2): 2 mg via INTRAVENOUS
  Filled 2022-08-28 (×2): qty 1

## 2022-08-28 MED ORDER — LORAZEPAM 1 MG PO TABS: 1.0000 mg | ORAL_TABLET | ORAL | Status: DC | PRN

## 2022-08-28 MED ORDER — LORAZEPAM 2 MG/ML IJ SOLN: 0.0000 mg | Freq: Two times a day (BID) | INTRAMUSCULAR | Status: DC

## 2022-08-28 MED ORDER — THIAMINE HCL 100 MG/ML IJ SOLN: 100.0000 mg | Freq: Every day | INTRAMUSCULAR | Status: DC

## 2022-08-28 MED ORDER — THIAMINE MONONITRATE 100 MG PO TABS
100.0000 mg | ORAL_TABLET | Freq: Every day | ORAL | Status: DC
  Administered 2022-08-29 – 2022-09-05 (×8): 100 mg via ORAL
  Filled 2022-08-28 (×8): qty 1

## 2022-08-28 MED ORDER — PERFLUTREN LIPID MICROSPHERE
1.0000 mL | INTRAVENOUS | Status: AC | PRN
  Administered 2022-08-28: 2 mL via INTRAVENOUS

## 2022-08-28 MED ORDER — LORAZEPAM 2 MG/ML IJ SOLN
1.0000 mg | INTRAMUSCULAR | Status: DC | PRN
  Administered 2022-08-28 (×2): 3 mg via INTRAVENOUS
  Administered 2022-08-28: 4 mg via INTRAVENOUS
  Filled 2022-08-28 (×3): qty 2
  Filled 2022-08-28: qty 1

## 2022-08-28 MED ORDER — POTASSIUM PHOSPHATES 15 MMOLE/5ML IV SOLN
20.0000 mmol | Freq: Once | INTRAVENOUS | Status: AC
  Administered 2022-08-28: 20 mmol via INTRAVENOUS
  Filled 2022-08-28: qty 6.67

## 2022-08-28 MED ORDER — ENOXAPARIN SODIUM 40 MG/0.4ML IJ SOSY
40.0000 mg | PREFILLED_SYRINGE | Freq: Every day | INTRAMUSCULAR | Status: DC
  Administered 2022-08-28 – 2022-09-05 (×9): 40 mg via SUBCUTANEOUS
  Filled 2022-08-28 (×9): qty 0.4

## 2022-08-28 MED ORDER — POTASSIUM CHLORIDE 10 MEQ/100ML IV SOLN
10.0000 meq | INTRAVENOUS | Status: AC
  Administered 2022-08-28 (×4): 10 meq via INTRAVENOUS
  Filled 2022-08-28 (×4): qty 100

## 2022-08-28 MED ORDER — METOPROLOL TARTRATE 5 MG/5ML IV SOLN: 2.5000 mg | Freq: Three times a day (TID) | INTRAVENOUS | Status: DC

## 2022-08-28 MED ORDER — PHENOBARBITAL SODIUM 65 MG/ML IJ SOLN
65.0000 mg | Freq: Once | INTRAMUSCULAR | Status: AC
  Administered 2022-08-28: 65 mg via INTRAVENOUS
  Filled 2022-08-28: qty 1

## 2022-08-28 MED ORDER — POTASSIUM CHLORIDE CRYS ER 20 MEQ PO TBCR: 40.0000 meq | EXTENDED_RELEASE_TABLET | Freq: Every day | ORAL | Status: DC

## 2022-08-28 MED ORDER — FOLIC ACID 1 MG PO TABS
1.0000 mg | ORAL_TABLET | Freq: Every day | ORAL | Status: DC
  Administered 2022-08-29 – 2022-09-05 (×8): 1 mg via ORAL
  Filled 2022-08-28 (×8): qty 1

## 2022-08-28 MED ORDER — ADULT MULTIVITAMIN W/MINERALS CH
1.0000 | ORAL_TABLET | Freq: Every day | ORAL | Status: DC
  Administered 2022-08-29 – 2022-09-05 (×8): 1 via ORAL
  Filled 2022-08-28 (×8): qty 1

## 2022-08-28 MED ORDER — LORAZEPAM 2 MG/ML IJ SOLN
1.0000 mg | INTRAMUSCULAR | Status: DC | PRN
  Administered 2022-08-29 – 2022-08-30 (×2): 2 mg via INTRAVENOUS
  Filled 2022-08-28 (×2): qty 1

## 2022-08-28 MED ORDER — PANTOPRAZOLE SODIUM 40 MG IV SOLR
40.0000 mg | INTRAVENOUS | Status: DC
  Administered 2022-08-28: 40 mg via INTRAVENOUS
  Filled 2022-08-28: qty 10

## 2022-08-28 NOTE — Progress Notes (Signed)
  Echocardiogram 2D Echocardiogram has been performed.  Bobbye Charleston 08/28/2022, 3:45 PM

## 2022-08-28 NOTE — Progress Notes (Signed)
ANTICOAGULATION CONSULT NOTE - Initial Consult  Pharmacy Consult for Lovenox Indication: VTE prophylaxis  Allergies  Allergen Reactions   Other Itching and Other (See Comments)    Seasonal allergies- Itchy eyes, runny nose, congestion   Poison Ivy Extract Rash    Patient Measurements: Weight: 60.8 kg (134 lb 0.6 oz)  Vital Signs: Temp: 97.5 F (36.4 C) (02/09 1300) Temp Source: Axillary (02/09 1300) BP: 109/75 (02/09 1300) Pulse Rate: 77 (02/09 1300)  Labs: Recent Labs    08/27/22 0123 08/27/22 0350 08/28/22 0339  HGB 10.7*  --  10.4*  HCT 32.4*  --  32.3*  PLT 231  --  175  CREATININE 0.62  --  0.64  TROPONINIHS 19* 20*  --     Estimated Creatinine Clearance: 78.1 mL/min (by C-G formula based on SCr of 0.64 mg/dL).   Medical History: Past Medical History:  Diagnosis Date   Actinic keratosis 11/27/2016   Alcohol abuse with alcohol-induced mood disorder ([place]) 07/18/2018   Alcohol use disorder, severe, dependence ([place]) 09/16/2006   05/22/2015 Argumentative, belligerent and verbally abusive to staff per his note from [facility])    Anxiety state 04/25/2018   Aortic atherosclerosis ([place]) 08/27/2022   Chronic low back pain 09/16/2006   Followed by NS.  Per his chart from [place], he fell off two storyhouse roof while cleaning his brother's gutters and sustained compression fracture of L3, L4 and L5 and fracture of right transverse process at L3 and nondisplaced fracture of left glenoid rim many years ago. He had multiple imaging in [place] including Lumbar MRI in 2016 which showed moderate to severe spinal canal stenos   Delirium tremens ([place]) 09/01/2017   Depression    Dry eye 11/23/2016   Foreign body in middle portion of esophagus 05/19/2022   Hiatal hernia 09/14/2016   Status Collis gastroplasty and Nissen's fundoplication on [id number] in [place]   History of delirium tremons 07/22/2020   DTs during 10/2016 08/2017. And  12/2017 admissions    History of pulmonary embolus (PE) 11/02/2016   Unprovoked 11/01/2016. Xarelto from 11/01/16 to 09/08/2017.   Hydrocele    Surgically corrected   Hypertension    Hypoalbuminemia due to protein-calorie malnutrition ([place]) 07/22/2020   Lumbar compression fracture (HCC)    Multiple rib fractures 07/22/2019   Left rib details XR: left ribs demonstrate multiple remote rib   Pancytopenia (HCC)    Rhabdomyolysis 07/22/2020   Skin cancer    exciced 2017   Tinea versicolor 06/08/2013   Tobacco use disorder 07/22/2020   Tooth infection 04/25/2018   Trigger finger, acquired 11/18/2012   Ulnar tunnel syndrome of right wrist 11/08/2012    Medications:  unprovoked PE-completed Xarelto in 2019  No current anticoagulation listed prior to admission  Assessment: Pharmacy consulted to dose Lovenox for DVT prophylaxis for this 67 yo male.  BMI < 30 and estimated CrCl greater than 30.  Goal of Therapy:  DVT prophylaxis Monitor platelets by anticoagulation protocol: Yes   Plan:  Lovenox 40 mg subq daily Pharmacy will sign off consult and continue to monitor renal function for any dose adjustment and signs of bleeding   Thank you for allowing pharmacy to be a part of this patient's care.  Royetta Asal, PharmD, BCPS Clinical Pharmacist [place] Please utilize Amion for appropriate phone number to reach the unit pharmacist ([place]) 08/28/2022 2:07 PM

## 2022-08-28 NOTE — Progress Notes (Addendum)
PROGRESS NOTE   Anthony [name]  PT:[id number]    DOB: 06-20-1956    DOA: 08/27/2022  PCP: Jacelyn Grip, MD   I have briefly reviewed patients previous medical records in [facility].  Chief Complaint  Patient presents with   Shortness of Breath    Brief Narrative:  67 year old homeless male with medical history significant for alcohol use disorder, prior DTs, anxiety and depression, chronic low back pain, unprovoked PE-completed Xarelto in 2019, HTN, tobacco use disorder presented to the ED on 08/27/2022 with complaints of shortness of breath for last few months associated with on and off lower extremity edema, dyspnea worse over the last few days PTA.  Not on any meds PTA.  Reportedly drinks 84 ounces of beer daily.  CTA chest confirmed no PE but showed dependent airspace disease in the lower lobes of the lungs bilaterally most suggestive of aspiration pneumonitis and developing aspiration pneumonia.  Admitted for aspiration pneumonia.  Course complicated by alcohol withdrawal delirium.   Assessment & Plan:  Principal Problem:   Aspiration pneumonia ([place]) Active Problems:   Tobacco use disorder   Alcohol use disorder, severe, dependence (HCC)   Acute on chronic diastolic CHF (congestive heart failure) (HCC)   Paroxysmal atrial fibrillation (HCC)   Back pain   Dyslipidemia   Hypomagnesemia   HTN (hypertension)   Macrocytic anemia   Poor social situation   Hiatal hernia   History of pulmonary embolus (PE)   Malnutrition of moderate degree   Aortic atherosclerosis (HCC)   Aspiration pneumonia: At risk due to history of alcohol use disorder.  On admission, tachypneic up to 30/min, tachycardic up to 120, normotensive but not hypoxic.  WBC 11.2.  COVID-19, flu, RSV PCR negative.  Urinary strep antigen negative.  D-dimer 1.82.  VBG pH of 7.46.  CTA chest 2/8: No PE.  Bibasal aspiration pneumonitis and developing aspiration pneumonia.  Although BNP elevated at 529.  Clinically or  radiologically without CHF features.  In fact may be on the drier side.  Discontinue IV Lasix.  Continue empirically started IV Zosyn.  Sputum culture pending.  Blood cultures x 2: Negative to date.  Aggressive pulmonary toilet.  N.p.o. while patient unsafe to take by mouth due to AMS.  Lactate normal.  Severe alcohol use disorder with withdrawal and delirium: As per H&P, no intention on quitting.  Ongoing high CIWA scores ranging from 8-30 today.  Continue CIWA protocol including scheduled and as needed benzodiazepines.  Continue IV thiamine, folate and oral multivitamins when able.  Has safety sitter at bedside and bilateral wrist soft restraints due to agitation and attempting to pull at lines and tubes in attempting to get out of bed.  If worsens, may have to transfer to stepdown unit for Precedex drip.  Serum acetaminophen, Sal related and blood alcohol levels negative on admission.  Acute metabolic encephalopathy: Secondary to alcohol withdrawal.  No focal deficits.  N.p.o. until safe to take p.o.  Delirium precautions.  Rest of management as above.  Multiple electrolyte abnormalities (hypokalemia, hypophosphatemia and hypomagnesemia): On admission magnesium 0.8.  Replace aggressively IV and follow labs daily and replace as needed.  Hyponatremia: May be due to beer potomania.  Follow daily BMP.  Macrocytic anemia: Suspect due to anemia of chronic disease.  Stable.  Monitor daily CBCs for pancytopenia related to alcohol toxic effects.  Tobacco use disorder: Chews tobacco does not smoke.  Reportedly has not been using recently.  Paroxysmal atrial fibrillation: CHA2DS2-VASc score of at least  4.  Currently in sinus rhythm.  Was not taking the amiodarone, metoprolol or Xarelto listed on his home meds.  Not a safe candidate to start anticoagulation due to history of alcohol dependence, noncompliance.  For same reason amiodarone not resumed.  Since patient is in sinus rhythm, not taking  metoprolol PTA, this was not resumed either.  Continue telemetry.  Dyslipidemia Currently n.p.o.  Essential hypertension Controlled or even soft blood pressures.  No antihypertensives for now.  History of pulmonary embolism: No PE on CTA chest. Add Lovenox for DVT prophylaxis.  Hiatal hernia: IV PPI  Chronic back pain Judicious use of opioids.  Supportive care.  Homelessness TOC has been consulted.  Body mass index is 19.23 kg/m.  Nutritional Status         DVT prophylaxis: SCDs Start: 08/27/22 0802     Code Status: Full Code:  Family Communication: None at bedside Disposition:  Status is: Inpatient Remains inpatient appropriate because: Acute metabolic encephalopathy, aspiration pneumonia, IV meds and n.p.o.     Consultants:     Procedures:     Antimicrobials:   IV Zosyn 2/8 >   Subjective:  Seen this morning.  Sedated from meds.  Safety sitter at bedside.  Bilateral wrist soft restraints.  To touch on repeated call, arousable, fights of examiner's hand.  No eye-opening or verbal response.  Does have purposeful movements.  Objective:   Vitals:   08/27/22 1230 08/27/22 1746 08/27/22 2103 08/28/22 0509  BP: 120/78 104/70 100/65 99/72  Pulse: (!) 108 83 92 88  Resp: 20 18 20 20  $ Temp: 98.6 F (37 C) 99.9 F (37.7 C) 98.3 F (36.8 C) 98.9 F (37.2 C)  TempSrc:  Oral Oral Oral  SpO2: 92% 97% 90% 93%  Weight:    60.8 kg    General exam: Middle-age male, looks older than stated age, moderately built, poorly nourished and frail, bitemporal wasting, decreased subcutaneous fat and limbs in other places, disheveled, lying supine in bed without distress. Respiratory system: Diminished breath sounds in the bases with occasional basal crackles.  Rest of lung fields clear to auscultation.  No increased work of breathing. Cardiovascular system: S1 & S2 heard, RRR. No JVD, murmurs, rubs, gallops or clicks. No pedal edema.  Telemetry personally reviewed: Sinus  rhythm. Gastrointestinal system: Abdomen is nondistended, soft and nontender. No organomegaly or masses felt. Normal bowel sounds heard. Central nervous system: Mental status as noted above. No focal neurological deficits. Extremities: Symmetric 5 x 5 power.  Bilateral wrist soft restraints Skin: No rashes, lesions or ulcers Psychiatry: Judgement and insight impaired. Mood & affect cannot be as asked at this time.   Data Reviewed:   I have personally reviewed following labs and imaging studies   CBC: Recent Labs  Lab 08/27/22 0123 08/28/22 0339  WBC 11.2* 8.1  NEUTROABS 9.7*  --   HGB 10.7* 10.4*  HCT 32.4* 32.3*  MCV 108.4* 111.8*  PLT 231 0000000    Basic Metabolic Panel: Recent Labs  Lab 08/27/22 0123 08/28/22 0339  NA 132* 130*  K 3.7 3.4*  CL 102 100  CO2 17* 23  GLUCOSE 73 82  BUN 5* 11  CREATININE 0.62 0.64  CALCIUM 7.8* 7.6*  MG 0.8* 1.4*  PHOS  --  1.9*    Liver Function Tests: Recent Labs  Lab 08/27/22 0123 08/28/22 0339  AST 36 32  ALT 15 14  ALKPHOS 113 92  BILITOT 1.2 1.1  PROT 7.2 6.4*  ALBUMIN 2.7* 2.4*  CBG: No results for input(s): "GLUCAP" in the last 168 hours.  Microbiology Studies:   Recent Results (from the past 240 hour(s))  Resp panel by RT-PCR (RSV, Flu A&B, Covid) Anterior Nasal Swab     Status: None   Collection Time: 08/27/22  1:41 AM   Specimen: Anterior Nasal Swab  Result Value Ref Range Status   SARS Coronavirus 2 by RT PCR NEGATIVE NEGATIVE Final    Comment: (NOTE) SARS-CoV-2 target nucleic acids are NOT DETECTED.  The SARS-CoV-2 RNA is generally detectable in upper respiratory specimens during the acute phase of infection. The lowest concentration of SARS-CoV-2 viral copies this assay can detect is 138 copies/mL. A negative result does not preclude SARS-Cov-2 infection and should not be used as the sole basis for treatment or other patient management decisions. A negative result may occur with  improper specimen  collection/handling, submission of specimen other than nasopharyngeal swab, presence of viral mutation(s) within the areas targeted by this assay, and inadequate number of viral copies(<138 copies/mL). A negative result must be combined with clinical observations, patient history, and epidemiological information. The expected result is Negative.  Fact Sheet for Patients:  EntrepreneurPulse.com.au  Fact Sheet for Healthcare Providers:  IncredibleEmployment.be  This test is no t yet approved or cleared by the [place] FDA and  has been authorized for detection and/or diagnosis of SARS-CoV-2 by FDA under an Emergency Use Authorization (EUA). This EUA will remain  in effect (meaning this test can be used) for the duration of the COVID-19 declaration under Section 564(b)(1) of the Act, 21 U.S.C.section 360bbb-3(b)(1), unless the authorization is terminated  or revoked sooner.       Influenza A by PCR NEGATIVE NEGATIVE Final   Influenza B by PCR NEGATIVE NEGATIVE Final    Comment: (NOTE) The Xpert Xpress SARS-CoV-2/FLU/RSV plus assay is intended as an aid in the diagnosis of influenza from Nasopharyngeal swab specimens and should not be used as a sole basis for treatment. Nasal washings and aspirates are unacceptable for Xpert Xpress SARS-CoV-2/FLU/RSV testing.  Fact Sheet for Patients: EntrepreneurPulse.com.au  Fact Sheet for Healthcare Providers: IncredibleEmployment.be  This test is not yet approved or cleared by the [place] FDA and has been authorized for detection and/or diagnosis of SARS-CoV-2 by FDA under an Emergency Use Authorization (EUA). This EUA will remain in effect (meaning this test can be used) for the duration of the COVID-19 declaration under Section 564(b)(1) of the Act, 21 U.S.C. section 360bbb-3(b)(1), unless the authorization is terminated or revoked.     Resp Syncytial  Virus by PCR NEGATIVE NEGATIVE Final    Comment: (NOTE) Fact Sheet for Patients: EntrepreneurPulse.com.au  Fact Sheet for Healthcare Providers: IncredibleEmployment.be  This test is not yet approved or cleared by the [place] FDA and has been authorized for detection and/or diagnosis of SARS-CoV-2 by FDA under an Emergency Use Authorization (EUA). This EUA will remain in effect (meaning this test can be used) for the duration of the COVID-19 declaration under Section 564(b)(1) of the Act, 21 U.S.C. section 360bbb-3(b)(1), unless the authorization is terminated or revoked.  Performed at [facility] [place] [address] [place], [place] [address]   Culture, blood (routine x 2)     Status: None (Preliminary result)   Collection Time: 08/27/22  5:42 AM   Specimen: BLOOD LEFT HAND  Result Value Ref Range Status   Specimen Description   Final    BLOOD LEFT HAND Performed at [facility]  [address] [place], [place] [address]    Special Requests   Final    BOTTLES DRAWN AEROBIC AND ANAEROBIC Blood Culture adequate volume Performed at [facility] [facility] [address], [place], [place] [address]    Culture   Final    NO GROWTH 1 DAY Performed at [facility], [place] [address] [place], [place] [address]    Report Status PENDING  Incomplete  Culture, blood (routine x 2)     Status: None (Preliminary result)   Collection Time: 08/27/22  6:00 AM   Specimen: BLOOD  Result Value Ref Range Status   Specimen Description   Final    BLOOD SITE NOT SPECIFIED Performed at [place] [address] [place], [place] [address]    Special Requests   Final    BOTTLES DRAWN AEROBIC AND ANAEROBIC Blood Culture results may not be optimal due to an excessive volume of blood received in culture bottles Performed at [facility] [address] [place],  [place] [address]    Culture   Final    NO GROWTH 1 DAY Performed at [facility], [place] [address], [place], [place] [address]    Report Status PENDING  Incomplete  Expectorated Sputum Assessment w Gram Stain, Rflx to Resp Cult     Status: None   Collection Time: 08/27/22  2:01 PM   Specimen: Expectorated Sputum  Result Value Ref Range Status   Specimen Description EXPECTORATED SPUTUM  Final   Special Requests NONE  Final   Sputum evaluation   Final    THIS SPECIMEN IS ACCEPTABLE FOR SPUTUM CULTURE Performed at [facility], [place] [address] [place], [place] [address]    Report Status 08/27/2022 FINAL  Final  Culture, Respiratory w Gram Stain     Status: None (Preliminary result)   Collection Time: 08/27/22  2:01 PM  Result Value Ref Range Status   Specimen Description   Final    EXPECTORATED SPUTUM Performed at [facility] [address] [place], [place] [address]    Special Requests   Final    NONE Reflexed from [phone] Performed at [facility], [place] [address] [place], [place] [address]    Gram Stain   Final    FEW WBC PRESENT, PREDOMINANTLY PMN FEW GRAM POSITIVE COCCI    Culture   Final    TOO YOUNG TO READ Performed at [facility], [place] [address] [place], [place] [address]    Report Status PENDING  Incomplete    Radiology Studies:  CT Angio Chest PE W/Cm &/Or Wo Cm  Result Date: 08/27/2022 CLINICAL DATA:  67 year old male with history of positive D-dimer suspected pulmonary embolism. EXAM: CT ANGIOGRAPHY CHEST WITH CONTRAST TECHNIQUE: Multidetector CT imaging of the chest was performed using the standard protocol during bolus administration of intravenous contrast. Multiplanar CT image reconstructions and MIPs were obtained to evaluate the vascular anatomy. RADIATION DOSE REDUCTION: This exam was performed according to the departmental dose-optimization program which includes automated exposure control,  adjustment of the mA and/or kV according to patient size and/or use of iterative reconstruction technique. CONTRAST:  66m OMNIPAQUE IOHEXOL 350 MG/ML SOLN COMPARISON:  Chest CTA 03/23/2022. FINDINGS: Cardiovascular: There are no filling defects within the pulmonary arterial tree to suggest pulmonary embolism. Heart size is mildly enlarged. There is no significant pericardial fluid, thickening or pericardial calcification. There is aortic atherosclerosis, as well as atherosclerosis of the great vessels  of the mediastinum and the coronary arteries, including calcified atherosclerotic plaque in the left main, left anterior descending and left circumflex coronary arteries. Mediastinum/Nodes: No pathologically enlarged mediastinal or hilar lymph nodes. Esophagus is unremarkable in appearance. No axillary lymphadenopathy. Lungs/Pleura: Patchy areas of ground-glass attenuation and airspace consolidation are noted in the dependent portions of the lower lobes bilaterally. In this same distribution there are extensive areas of mucoid impaction, most evident in the left lower lobe. No pleural effusions. No definite suspicious appearing pulmonary nodules or masses are noted. Upper Abdomen: Aortic atherosclerosis. Low attenuation throughout the visualized hepatic parenchyma, indicative of a background of hepatic steatosis. Musculoskeletal: Several old healed bilateral posterior rib fractures are incidentally noted. There are no aggressive appearing lytic or blastic lesions noted in the visualized portions of the skeleton. Review of the MIP images confirms the above findings. IMPRESSION: 1. No evidence of pulmonary embolism. 2. Dependent airspace disease in the lower lobes of the lungs bilaterally, the appearance of which is most suggestive of aspiration pneumonitis and developing aspiration pneumonia. Further clinical evaluation is recommended. 3. Aortic atherosclerosis, in addition to left main and 2 vessel coronary artery  disease. Please note that although the presence of coronary artery calcium documents the presence of coronary artery disease, the severity of this disease and any potential stenosis cannot be assessed on this non-gated CT examination. Assessment for potential risk factor modification, dietary therapy or pharmacologic therapy may be warranted, if clinically indicated. 4. Mild cardiomegaly. Aortic Atherosclerosis (ICD10-I70.0). Electronically Signed   By: Vinnie Langton M.D.   On: 08/27/2022 05:10   DG Chest Port 1 View  Result Date: 08/27/2022 CLINICAL DATA:  Shortness of breath EXAM: PORTABLE CHEST 1 VIEW COMPARISON:  07/15/2022 FINDINGS: Mild chronic elevation of the right hemidiaphragm. Heart and mediastinal contours are within normal limits. No focal opacities or effusions. No acute bony abnormality. IMPRESSION: No active disease. Electronically Signed   By: Rolm Baptise M.D.   On: 08/27/2022 01:10    Scheduled Meds:    famotidine  20 mg Oral BID   folic acid  1 mg Oral Daily   furosemide  20 mg Intravenous Daily   lisinopril  2.5 mg Oral Daily   LORazepam  0-4 mg Oral Q6H   Followed by   Derrill Memo ON 08/29/2022] LORazepam  0-4 mg Oral Q12H   metoprolol tartrate  12.5 mg Oral BID   multivitamin with minerals  1 tablet Oral Daily   potassium chloride  40 mEq Oral Daily   thiamine  100 mg Oral Daily   Or   thiamine  100 mg Intravenous Daily    Continuous Infusions:    piperacillin-tazobactam 3.375 g (08/28/22 0519)   potassium PHOSPHATE IVPB (in mmol) 20 mmol (08/28/22 0951)     LOS: 1 day     Vernell Leep, MD,  FACP, [place], SFHM, [facility], [place]     To contact the attending provider between 7A-7P or the covering provider during after hours 7P-7A, please log into the web site [email or web address] and access using universal [place] password for that web site. If you do not have the password, please call the hospital  operator.  08/28/2022, 1:02 PM

## 2022-08-29 DIAGNOSIS — J690 Pneumonitis due to inhalation of food and vomit: Secondary | ICD-10-CM | POA: Diagnosis not present

## 2022-08-29 LAB — COMPREHENSIVE METABOLIC PANEL
ALT: 13 U/L (ref 0–44)
AST: 29 U/L (ref 15–41)
Albumin: 2.4 g/dL — ABNORMAL LOW (ref 3.5–5.0)
Alkaline Phosphatase: 80 U/L (ref 38–126)
Anion gap: 10 (ref 5–15)
BUN: 7 mg/dL — ABNORMAL LOW (ref 8–23)
CO2: 25 mmol/L (ref 22–32)
Calcium: 7.9 mg/dL — ABNORMAL LOW (ref 8.9–10.3)
Chloride: 98 mmol/L (ref 98–111)
Creatinine, Ser: 0.62 mg/dL (ref 0.61–1.24)
GFR, Estimated: >60 mL/min (ref >60)
Glucose, Bld: 89 mg/dL (ref 70–99)
Potassium: 3.7 mmol/L (ref 3.5–5.1)
Sodium: 133 mmol/L — ABNORMAL LOW (ref 135–145)
Total Bilirubin: 1 mg/dL (ref 0.3–1.2)
Total Protein: 6.4 g/dL — ABNORMAL LOW (ref 6.5–8.1)

## 2022-08-29 LAB — CBC
HCT: 30.6 % — ABNORMAL LOW (ref 39.0–52.0)
Hemoglobin: 10.1 g/dL — ABNORMAL LOW (ref 13.0–17.0)
MCH: 36.1 pg — ABNORMAL HIGH (ref 26.0–34.0)
MCHC: 33 g/dL (ref 30.0–36.0)
MCV: 109.3 fL — ABNORMAL HIGH (ref 80.0–100.0)
Platelets: 154 10*3/uL (ref 150–400)
RBC: 2.8 MIL/uL — ABNORMAL LOW (ref 4.22–5.81)
RDW: 17.1 % — ABNORMAL HIGH (ref 11.5–15.5)
WBC: 7 10*3/uL (ref 4.0–10.5)
nRBC: 0 % (ref 0.0–0.2)

## 2022-08-29 LAB — MAGNESIUM: Magnesium: 1.7 mg/dL (ref 1.7–2.4)

## 2022-08-29 LAB — PHOSPHORUS: Phosphorus: 3.4 mg/dL (ref 2.5–4.6)

## 2022-08-29 MED ORDER — AMOXICILLIN-POT CLAVULANATE 875-125 MG PO TABS
1.0000 | ORAL_TABLET | Freq: Two times a day (BID) | ORAL | Status: AC
  Administered 2022-08-29 – 2022-09-02 (×8): 1 via ORAL
  Filled 2022-08-29 (×8): qty 1

## 2022-08-29 MED ORDER — POTASSIUM CHLORIDE CRYS ER 20 MEQ PO TBCR
40.0000 meq | EXTENDED_RELEASE_TABLET | Freq: Once | ORAL | Status: AC
  Administered 2022-08-29: 40 meq via ORAL
  Filled 2022-08-29: qty 2

## 2022-08-29 MED ORDER — ACETAMINOPHEN 325 MG PO TABS
650.0000 mg | ORAL_TABLET | ORAL | Status: DC | PRN
  Administered 2022-08-29 – 2022-09-05 (×13): 650 mg via ORAL
  Filled 2022-08-29 (×13): qty 2

## 2022-08-29 MED ORDER — PANTOPRAZOLE SODIUM 40 MG PO TBEC
40.0000 mg | DELAYED_RELEASE_TABLET | Freq: Every day | ORAL | Status: DC
  Administered 2022-08-29 – 2022-09-04 (×7): 40 mg via ORAL
  Filled 2022-08-29 (×7): qty 1

## 2022-08-29 MED ORDER — MAGNESIUM SULFATE 2 GM/50ML IV SOLN
2.0000 g | Freq: Once | INTRAVENOUS | Status: AC
  Administered 2022-08-29: 2 g via INTRAVENOUS
  Filled 2022-08-29: qty 50

## 2022-08-29 NOTE — Progress Notes (Signed)
PROGRESS NOTE   Anthony [name]  [id number]    DOB: 26-Dec-1955    DOA: 08/27/2022  PCP: Jacelyn Grip, MD   I have briefly reviewed patients previous medical records in [facility].  Chief Complaint  Patient presents with   Shortness of Breath    Brief Narrative:  67 year old homeless male with medical history significant for alcohol use disorder, prior DTs, anxiety and depression, chronic low back pain, unprovoked PE-completed Xarelto in 2019, HTN, tobacco use disorder presented to the ED on 08/27/2022 with complaints of shortness of breath for last few months associated with on and off lower extremity edema, dyspnea worse over the last few days PTA.  Not on any meds PTA.  Reportedly drinks 84 ounces of beer daily.  CTA chest confirmed no PE but showed dependent airspace disease in the lower lobes of the lungs bilaterally most suggestive of aspiration pneumonitis and developing aspiration pneumonia.  Admitted for aspiration pneumonia.  Course complicated by alcohol withdrawal delirium.  Improving.   Assessment & Plan:  Principal Problem:   Aspiration pneumonia ([place]) Active Problems:   Tobacco use disorder   Alcohol use disorder, severe, dependence (HCC)   Acute on chronic diastolic CHF (congestive heart failure) (HCC)   Paroxysmal atrial fibrillation (HCC)   Back pain   Dyslipidemia   Hypomagnesemia   HTN (hypertension)   Macrocytic anemia   Poor social situation   Hiatal hernia   History of pulmonary embolus (PE)   Malnutrition of moderate degree   Aortic atherosclerosis (HCC)   Aspiration pneumonia: At risk due to history of alcohol use disorder.  On admission, tachypneic up to 30/min, tachycardic up to 120, normotensive but not hypoxic.  WBC 11.2.  COVID-19, flu, RSV PCR negative.  Urinary strep antigen negative.  D-dimer 1.82.  VBG pH of 7.46.  CTA chest 2/8: No PE.  Bibasal aspiration pneumonitis and developing aspiration pneumonia.  Although BNP elevated at 529.   Clinically or radiologically without CHF features.  Discontinued IV Lasix.  Continue empirically started IV Zosyn.  Sputum culture pending, reintubated for better growth.  Blood cultures x 2: Negative to date.  Aggressive pulmonary toilet. Lactate normal.  Mental status improved and probably back to his baseline.  Diet initiated.  Changed to oral Augmentin to complete total 7 days course.  Severe alcohol use disorder with withdrawal and delirium: As per H&P, no intention on quitting.  Had high CIWA scores on 2/9, improved mental status, alert and coherent without overt withdrawal signs, CIWA scores in the 4 range for the last 2 readings since this morning. Continue CIWA protocol including scheduled and as needed benzodiazepines.  Continue po thiamine, folate and oral multivitamins.  Has safety sitter at bedside, hopefully can be discontinued soon.  Off wrist restraints.  Serum acetaminophen, salicylate and blood alcohol levels negative on admission.  Improved.  Acute metabolic encephalopathy: Secondary to alcohol withdrawal.  No focal deficits. Delirium precautions.  Rest of management as above.  Improved and may have resolved but will continue to monitor.  Multiple electrolyte abnormalities (hypokalemia, hypophosphatemia and hypomagnesemia): On admission magnesium 0.8.  Improved but will continue to replace and follow in AM.  Hyponatremia: May be due to beer potomania.  Follow daily BMP.  Improved and stable.  Macrocytic anemia: Suspect due to anemia of chronic disease.  Stable.   Tobacco use disorder: Chews tobacco does not smoke.  Reportedly has not been using recently.  Paroxysmal atrial fibrillation: CHA2DS2-VASc score of at least 4.  Remain  in's in sinus rhythm.  Was not taking the amiodarone, metoprolol or Xarelto listed on his home meds or any of his prescription meds.  Not a safe candidate to start anticoagulation or amiodarone due to history of alcohol dependence, noncompliance. Since  patient is in sinus rhythm, not taking metoprolol PTA, this was not resumed either.  Discontinued telemetry 2/10.  Dyslipidemia Not on meds PTA  Essential hypertension Not on meds PTA.  Controlled off meds here.  History of pulmonary embolism: No PE on CTA chest.  Continue Lovenox for DVT prophylaxis.  Hiatal hernia: Not on meds PTA.  Chronic back pain Judicious use of opioids.  Supportive care.  Homelessness TOC has been consulted.  Body mass index is 19.26 kg/m.  Nutritional Status         DVT prophylaxis: enoxaparin (LOVENOX) injection 40 mg Start: 08/28/22 1500 SCDs Start: 08/27/22 0802     Code Status: Full Code:  Family Communication: None at bedside Disposition:  Clinically improved.  Pending stability over the next 24 hours, possible DC 2/11.     Consultants:     Procedures:     Antimicrobials:   IV Zosyn 2/8 >   Subjective:  Off restraints.  Safety sitter at bedside.  Alert oriented to person, place, partly to time (only could not get the date right).  States that he drinks 84 ounces of beers daily alternating with?  32 mL liquor.  Homeless and stays "wherever I get a chance".  Does want assistance for a shelter.  Reports cough with intermittent production of white, green and yellow sputum.  No dyspnea or pain.  Wants to use the bathroom right now.  Objective:   Vitals:   08/29/22 0633 08/29/22 0935 08/29/22 1350 08/29/22 1422  BP: 113/81 120/79 124/79 124/79  Pulse: 95 86 85 85  Resp: 20  18   Temp: 98 F (36.7 C)  99.1 F (37.3 C)   TempSrc:   Oral   SpO2: 92%  100%   Weight:      Height:        General exam: Middle-age male, looks older than stated age, moderately built, poorly nourished and frail, bitemporal wasting, decreased subcutaneous fat and limbs in other places, disheveled, sitting up comfortably in bed without distress.  Looks much improved compared to 2/9. Respiratory system: Occasional basal crackles but otherwise clear to  auscultation.  No increased work of breathing. Cardiovascular system: S1 & S2 heard, RRR. No JVD, murmurs, rubs, gallops or clicks. No pedal edema.  Telemetry 3 personally reviewed: Sinus rhythm. Gastrointestinal system: Abdomen is nondistended, soft and nontender. No organomegaly or masses felt. Normal bowel sounds heard. Central nervous system: Mental status as noted above, improved. No focal neurological deficits. Extremities: Symmetric 5 x 5 power.  Off wrist restraints Skin: No rashes, lesions or ulcers Psychiatry: Judgement and insight appears to be intact. Mood & affect pleasant and appropriate.   Data Reviewed:   I have personally reviewed following labs and imaging studies   CBC: Recent Labs  Lab 08/27/22 0123 08/28/22 0339 08/29/22 0430  WBC 11.2* 8.1 7.0  NEUTROABS 9.7*  --   --   HGB 10.7* 10.4* 10.1*  HCT 32.4* 32.3* 30.6*  MCV 108.4* 111.8* 109.3*  PLT 231 175 [id number]    Basic Metabolic Panel: Recent Labs  Lab 08/27/22 0123 08/28/22 0339 08/29/22 0430  NA 132* 130* 133*  K 3.7 3.4* 3.7  CL 102 100 98  CO2 17* 23 25  GLUCOSE 73 82  89  BUN 5* 11 7*  CREATININE 0.62 0.64 0.62  CALCIUM 7.8* 7.6* 7.9*  MG 0.8* 1.4* 1.7  PHOS  --  1.9* 3.4    Liver Function Tests: Recent Labs  Lab 08/27/22 0123 08/28/22 0339 08/29/22 0430  AST 36 32 29  ALT 15 14 13  $ ALKPHOS 113 92 80  BILITOT 1.2 1.1 1.0  PROT 7.2 6.4* 6.4*  ALBUMIN 2.7* 2.4* 2.4*    CBG: No results for input(s): "GLUCAP" in the last 168 hours.  Microbiology Studies:   Recent Results (from the past 240 hour(s))  Resp panel by RT-PCR (RSV, Flu A&B, Covid) Anterior Nasal Swab     Status: None   Collection Time: 08/27/22  1:41 AM   Specimen: Anterior Nasal Swab  Result Value Ref Range Status   SARS Coronavirus 2 by RT PCR NEGATIVE NEGATIVE Final    Comment: (NOTE) SARS-CoV-2 target nucleic acids are NOT DETECTED.  The SARS-CoV-2 RNA is generally detectable in upper respiratory specimens  during the acute phase of infection. The lowest concentration of SARS-CoV-2 viral copies this assay can detect is 138 copies/mL. A negative result does not preclude SARS-Cov-2 infection and should not be used as the sole basis for treatment or other patient management decisions. A negative result may occur with  improper specimen collection/handling, submission of specimen other than nasopharyngeal swab, presence of viral mutation(s) within the areas targeted by this assay, and inadequate number of viral copies(<138 copies/mL). A negative result must be combined with clinical observations, patient history, and epidemiological information. The expected result is Negative.  Fact Sheet for Patients:  EntrepreneurPulse.com.au  Fact Sheet for Healthcare Providers:  IncredibleEmployment.be  This test is no t yet approved or cleared by the [place] FDA and  has been authorized for detection and/or diagnosis of SARS-CoV-2 by FDA under an Emergency Use Authorization (EUA). This EUA will remain  in effect (meaning this test can be used) for the duration of the COVID-19 declaration under Section 564(b)(1) of the Act, 21 U.S.C.section 360bbb-3(b)(1), unless the authorization is terminated  or revoked sooner.       Influenza A by PCR NEGATIVE NEGATIVE Final   Influenza B by PCR NEGATIVE NEGATIVE Final    Comment: (NOTE) The Xpert Xpress SARS-CoV-2/FLU/RSV plus assay is intended as an aid in the diagnosis of influenza from Nasopharyngeal swab specimens and should not be used as a sole basis for treatment. Nasal washings and aspirates are unacceptable for Xpert Xpress SARS-CoV-2/FLU/RSV testing.  Fact Sheet for Patients: EntrepreneurPulse.com.au  Fact Sheet for Healthcare Providers: IncredibleEmployment.be  This test is not yet approved or cleared by the [place] FDA and has been authorized for detection  and/or diagnosis of SARS-CoV-2 by FDA under an Emergency Use Authorization (EUA). This EUA will remain in effect (meaning this test can be used) for the duration of the COVID-19 declaration under Section 564(b)(1) of the Act, 21 U.S.C. section 360bbb-3(b)(1), unless the authorization is terminated or revoked.     Resp Syncytial Virus by PCR NEGATIVE NEGATIVE Final    Comment: (NOTE) Fact Sheet for Patients: EntrepreneurPulse.com.au  Fact Sheet for Healthcare Providers: IncredibleEmployment.be  This test is not yet approved or cleared by the [place] FDA and has been authorized for detection and/or diagnosis of SARS-CoV-2 by FDA under an Emergency Use Authorization (EUA). This EUA will remain in effect (meaning this test can be used) for the duration of the COVID-19 declaration under Section 564(b)(1) of the Act, 21 U.S.C. section 360bbb-3(b)(1), unless  the authorization is terminated or revoked.  Performed at [facility], [place] [address], [place], [place] [address]   Culture, blood (routine x 2)     Status: None (Preliminary result)   Collection Time: 08/27/22  5:42 AM   Specimen: BLOOD LEFT HAND  Result Value Ref Range Status   Specimen Description   Final    BLOOD LEFT HAND Performed at [place] [address], [place], [place] [address]    Special Requests   Final    BOTTLES DRAWN AEROBIC AND ANAEROBIC Blood Culture adequate volume Performed at [place] [address] [place], [place] [address]    Culture   Final    NO GROWTH 2 DAYS Performed at [place] [address], [place], [place] [address]    Report Status PENDING  Incomplete  Culture, blood (routine x 2)     Status: None (Preliminary result)   Collection Time: 08/27/22  6:00 AM   Specimen: BLOOD  Result Value Ref Range Status   Specimen Description   Final    BLOOD SITE NOT  SPECIFIED Performed at [facility] [place] [address], [place], [place] [address]    Special Requests   Final    BOTTLES DRAWN AEROBIC AND ANAEROBIC Blood Culture results may not be optimal due to an excessive volume of blood received in culture bottles Performed at [place] [facility] [address], [place], [place] [address]    Culture   Final    NO GROWTH 2 DAYS Performed at [facility] [address] [place], [place] [address]    Report Status PENDING  Incomplete  Expectorated Sputum Assessment w Gram Stain, Rflx to Resp Cult     Status: None   Collection Time: 08/27/22  2:01 PM   Specimen: Expectorated Sputum  Result Value Ref Range Status   Specimen Description EXPECTORATED SPUTUM  Final   Special Requests NONE  Final   Sputum evaluation   Final    THIS SPECIMEN IS ACCEPTABLE FOR SPUTUM CULTURE Performed at [facility], [place] [address] [place], [place] [address]    Report Status 08/27/2022 FINAL  Final  Culture, Respiratory w Gram Stain     Status: None (Preliminary result)   Collection Time: 08/27/22  2:01 PM  Result Value Ref Range Status   Specimen Description   Final    EXPECTORATED SPUTUM Performed at [place] [address] [place], [place] [address]    Special Requests   Final    NONE Reflexed from [id number] Performed at [facility], [place] [address] [place], [place] [address]    Gram Stain   Final    FEW WBC PRESENT, PREDOMINANTLY PMN FEW GRAM POSITIVE COCCI    Culture   Final    CULTURE REINCUBATED FOR BETTER GROWTH Performed at [facility], [place] [address], [place],  [address]    Report Status PENDING  Incomplete    Radiology Studies:  ECHOCARDIOGRAM COMPLETE  Result Date: 08/28/2022    ECHOCARDIOGRAM REPORT   Patient Name:   Kobie A Ureste Date of Exam: 08/28/2022 Medical Rec #:  [id number]    Height:       70.0 in Accession #:    [id number]    Weight:       134.0 lb Date of Birth:  [birth year]    BSA:  1.761 m Patient Age:    32 years     BP:           99/72 mmHg Patient Gender: M            HR:           79 bpm. Exam Location:  Inpatient Procedure: 2D Echo, Cardiac Doppler, Color Doppler and Intracardiac            Opacification Agent Indications:    I50.40* Unspecified combined systolic (congestive) and diastolic                 (congestive) heart failure  History:        Patient has prior history of Echocardiogram examinations, most                 recent 03/17/2022. Signs/Symptoms:Altered Mental Status, Dyspnea                 and Shortness of Breath; Risk Factors:Hypertension and Current                 Smoker. ETOH.  Sonographer:    Roseanna Rainbow RDCS Referring Phys: [phone] Estel Scholze D Annalaura Sauseda  Sonographer Comments: Technically difficult study due to poor echo windows, suboptimal parasternal window, suboptimal apical window and suboptimal subcostal window. Image acquisition challenging due to uncooperative patient and Image acquisition challenging due to patient behavioral factors. Patient in restraints attempting to get up during study. Patient supine and could not turn. IMPRESSIONS  1. Left ventricular ejection fraction, by estimation, is 60 to 65%. The left ventricle has normal function. The left ventricle has no regional wall motion abnormalities. Left ventricular diastolic parameters are indeterminate.  2. Right ventricular systolic function is normal. The right ventricular size is mildly enlarged. Tricuspid regurgitation signal is inadequate for assessing PA pressure.  3. The mitral valve is normal in structure. No evidence of mitral valve regurgitation. No evidence of mitral stenosis.  4. The aortic valve was not well visualized. Aortic valve regurgitation is not visualized. No aortic stenosis is present. FINDINGS  Left Ventricle: Left ventricular ejection fraction, by estimation, is 60 to 65%. The left ventricle has normal function. The left  ventricle has no regional wall motion abnormalities. Definity contrast agent was given IV to delineate the left ventricular  endocardial borders. The left ventricular internal cavity size was normal in size. There is no left ventricular hypertrophy. Left ventricular diastolic parameters are indeterminate. Right Ventricle: The right ventricular size is mildly enlarged. Right vetricular wall thickness was not well visualized. Right ventricular systolic function is normal. Tricuspid regurgitation signal is inadequate for assessing PA pressure. Left Atrium: Left atrial size was normal in size. Right Atrium: Right atrial size was normal in size. Pericardium: There is no evidence of pericardial effusion. Presence of epicardial fat layer. Mitral Valve: The mitral valve is normal in structure. No evidence of mitral valve regurgitation. No evidence of mitral valve stenosis. Tricuspid Valve: The tricuspid valve is normal in structure. Tricuspid valve regurgitation is trivial. Aortic Valve: The aortic valve was not well visualized. Aortic valve regurgitation is not visualized. No aortic stenosis is present. Pulmonic Valve: The pulmonic valve was not well visualized. Pulmonic valve regurgitation is not visualized. Aorta: The aortic root is normal in size and structure. IAS/Shunts: The interatrial septum was not well visualized.  LEFT VENTRICLE PLAX 2D LVIDd:         4.40 cm LVIDs:         3.70 cm LV PW:  0.90 cm LV IVS:        0.70 cm LVOT diam:     2.50 cm LV SV:         46 LV SV Index:   26 LVOT Area:     4.91 cm  LV Volumes (MOD) LV vol s, MOD A4C: 53.0 ml RIGHT VENTRICLE RV S prime:     12.20 cm/s TAPSE (M-mode): 1.3 cm LEFT ATRIUM           Index        RIGHT ATRIUM           Index LA diam:      3.90 cm 2.21 cm/m   RA Area:     14.10 cm LA Vol (A2C): 9.9 ml  5.62 ml/m   RA Volume:   29.80 ml  16.92 ml/m LA Vol (A4C): 46.6 ml 26.46 ml/m  AORTIC VALVE LVOT Vmax:   59.30 cm/s LVOT Vmean:  37.400 cm/s LVOT VTI:     0.094 m  AORTA Ao Root diam: 3.70 cm MITRAL VALVE MV Area (PHT): 3.50 cm    SHUNTS MV Decel Time: 217 msec    Systemic VTI:  0.09 m MV E velocity: 48.30 cm/s  Systemic Diam: 2.50 cm MV A velocity: 41.15 cm/s MV E/A ratio:  1.17 Oswaldo Milian MD Electronically signed by Oswaldo Milian MD Signature Date/Time: 08/28/2022/5:01:33 PM    Final     Scheduled Meds:    enoxaparin (LOVENOX) injection  40 mg Subcutaneous Daily   folic acid  1 mg Oral Daily   LORazepam  0-4 mg Intravenous Q6H   Followed by   Derrill Memo ON 08/30/2022] LORazepam  0-4 mg Intravenous Q12H   multivitamin with minerals  1 tablet Oral Daily   pantoprazole  40 mg Oral QHS   thiamine  100 mg Oral Daily    Continuous Infusions:    piperacillin-tazobactam 3.375 g (08/29/22 1350)     LOS: 2 days     Vernell Leep, MD,  FACP, FHM, SFHM, [facility], [place]     To contact the attending provider between 7A-7P or the covering provider during after hours 7P-7A, please log into the web site [email or web address] and access using universal [place] password for that web site. If you do not have the password, please call the hospital operator.  08/29/2022, 2:53 PM

## 2022-08-30 DIAGNOSIS — J690 Pneumonitis due to inhalation of food and vomit: Secondary | ICD-10-CM | POA: Diagnosis not present

## 2022-08-30 LAB — CULTURE, RESPIRATORY W GRAM STAIN: Culture: NORMAL

## 2022-08-30 LAB — BASIC METABOLIC PANEL
Anion gap: 9 (ref 5–15)
BUN: 8 mg/dL (ref 8–23)
CO2: 24 mmol/L (ref 22–32)
Calcium: 8.2 mg/dL — ABNORMAL LOW (ref 8.9–10.3)
Chloride: 101 mmol/L (ref 98–111)
Creatinine, Ser: 0.57 mg/dL — ABNORMAL LOW (ref 0.61–1.24)
GFR, Estimated: >60 mL/min (ref >60)
Glucose, Bld: 97 mg/dL (ref 70–99)
Potassium: 4.3 mmol/L (ref 3.5–5.1)
Sodium: 134 mmol/L — ABNORMAL LOW (ref 135–145)

## 2022-08-30 LAB — MAGNESIUM: Magnesium: 1.8 mg/dL (ref 1.7–2.4)

## 2022-08-30 NOTE — Progress Notes (Signed)
PROGRESS NOTE   Anthony [name]  PT:[id number]    DOB: [birth year]    DOA: 08/27/2022  PCP: Jacelyn Grip, MD   I have briefly reviewed patients previous medical records in [facility].  Chief Complaint  Patient presents with   Shortness of Breath    Brief Narrative:  67 year old homeless male with medical history significant for alcohol use disorder, prior DTs, anxiety and depression, chronic low back pain, unprovoked PE-completed Xarelto in 2019, HTN, tobacco use disorder presented to the ED on 08/27/2022 with complaints of shortness of breath for last few months associated with on and off lower extremity edema, dyspnea worse over the last few days PTA.  Not on any meds PTA.  Reportedly drinks 84 ounces of beer daily.  CTA chest confirmed no PE but showed dependent airspace disease in the lower lobes of the lungs bilaterally most suggestive of aspiration pneumonitis and developing aspiration pneumonia.  Admitted for aspiration pneumonia.  Course complicated by alcohol withdrawal delirium.  Improved.  Medically stable for DC as of 2/11 but [facility] (shelter) closed on weekends.  DC tomorrow.   Assessment & Plan:  Principal Problem:   Aspiration pneumonia ([place]) Active Problems:   Tobacco use disorder   Alcohol use disorder, severe, dependence (HCC)   Acute on chronic diastolic CHF (congestive heart failure) (HCC)   Paroxysmal atrial fibrillation (HCC)   Back pain   Dyslipidemia   Hypomagnesemia   HTN (hypertension)   Macrocytic anemia   Poor social situation   Hiatal hernia   History of pulmonary embolus (PE)   Malnutrition of moderate degree   Aortic atherosclerosis (HCC)   Aspiration pneumonia: At risk due to history of alcohol use disorder.  On admission, tachypneic up to 30/min, tachycardic up to 120, normotensive but not hypoxic.  WBC 11.2.  COVID-19, flu, RSV PCR negative.  Urinary strep antigen negative.  D-dimer 1.82.  VBG pH of 7.46.  CTA chest 2/8: No PE.  Bibasal aspiration  pneumonitis and developing aspiration pneumonia.  Although BNP elevated at 529.  Clinically or radiologically without CHF features.  Discontinued IV Lasix.  Continue empirically started IV Zosyn.  Sputum culture pending, reintubated for better growth.  Blood cultures x 2: Negative to date.  Aggressive pulmonary toilet. Lactate normal.  Mental status improved and probably back to his baseline.  Diet initiated.  Changed to oral Augmentin to complete total 7 days course.  Improved and stable.  Severe alcohol use disorder with withdrawal and delirium: As per H&P, no intention on quitting.  Had high CIWA scores on 2/9, improved mental status, alert and coherent without overt withdrawal signs, CIWA scores in the 4 range for the last 2 readings since this morning. Continue CIWA protocol including scheduled and as needed benzodiazepines.  Continue po thiamine, folate and oral multivitamins.  Has safety sitter at bedside, hopefully can be discontinued soon.  Off wrist restraints.  Serum acetaminophen, salicylate and blood alcohol levels negative on admission.  Interestingly had CIWA score of 11 at midnight but prior to that and since that time low CIWA scores including 1 and 0 today.?  Sundowning at night.  No safety sitter needed last night.  Acute metabolic encephalopathy: Secondary to alcohol withdrawal.  No focal deficits. Delirium precautions.  Rest of management as above.  Resolved.  Multiple electrolyte abnormalities (hypokalemia, hypophosphatemia and hypomagnesemia): On admission magnesium 0.8.  All resolved.  Hyponatremia: May be due to beer potomania.  Stable.  Macrocytic anemia: Suspect due to anemia of chronic  disease.  Stable.   Tobacco use disorder: Chews tobacco does not smoke.  Reportedly has not been using recently.  Paroxysmal atrial fibrillation: CHA2DS2-VASc score of at least 4.  Remain in's in sinus rhythm.  Was not taking the amiodarone, metoprolol or Xarelto listed on his home meds  or any of his prescription meds.  Not a safe candidate to start anticoagulation or amiodarone due to history of alcohol dependence, noncompliance. Since patient is in sinus rhythm, not taking metoprolol PTA, this was not resumed either.  Discontinued telemetry 2/10.  Dyslipidemia Not on meds PTA  Essential hypertension Not on meds PTA.  Controlled off meds here.  History of pulmonary embolism: No PE on CTA chest.  Continue Lovenox for DVT prophylaxis.  Hiatal hernia: Not on meds PTA.  Chronic back pain Judicious use of opioids.  Supportive care.  Homelessness TOC has been consulted.  As per discussion with TOC team, patient can go to [facility] (shelter) tomorrow.  Body mass index is 19.77 kg/m.  Nutritional Status         DVT prophylaxis: enoxaparin (LOVENOX) injection 40 mg Start: 08/28/22 1500 SCDs Start: 08/27/22 0802     Code Status: Full Code:  Family Communication: None at bedside Disposition:  Medically optimized for DC as of 2/11 but shelter unable to take patient until tomorrow.     Consultants:     Procedures:     Antimicrobials:   IV Zosyn 2/8 >   Subjective:  Off restraints for more than 24 hours, did not require safety sitter last night.  Per tech report, may have attempted to get out of bed last night.  This morning mostly alert and coherent.  Denies complaints.  No acute issues reported by RN.  PT recommends no outpatient follow-up.  Objective:   Vitals:   08/29/22 2056 08/30/22 0500 08/30/22 0640 08/30/22 1332  BP: 118/77  121/85 108/78  Pulse: 100  (!) 102 (!) 107  Resp: 20   20  Temp: (!) 100.5 F (38.1 C)  97.7 F (36.5 C) 99.6 F (37.6 C)  TempSrc: Oral  Oral Oral  SpO2: 96%  100% 98%  Weight:  62.5 kg    Height:        General exam: Middle-age male, looks older than stated age, moderately built, poorly nourished and frail, bitemporal wasting, decreased subcutaneous fat and limbs in other places, disheveled, sitting up comfortably in  bed without distress.  Appears to be in good spirits. Respiratory system: Occasional basal crackles but otherwise clear to auscultation.  No increased work of breathing.  No changes/stable. Cardiovascular system: S1 & S2 heard, RRR. No JVD, murmurs, rubs, gallops or clicks. No pedal edema.  Off of telemetry. Gastrointestinal system: Abdomen is nondistended, soft and nontender. No organomegaly or masses felt. Normal bowel sounds heard. Central nervous system: Alert and oriented x 3. No focal neurological deficits. Extremities: Symmetric 5 x 5 power.  Off wrist restraints Skin: No rashes, lesions or ulcers Psychiatry: Judgement and insight appears to be intact. Mood & affect pleasant and appropriate.   Data Reviewed:   I have personally reviewed following labs and imaging studies   CBC: Recent Labs  Lab 08/27/22 0123 08/28/22 0339 08/29/22 0430  WBC 11.2* 8.1 7.0  NEUTROABS 9.7*  --   --   HGB 10.7* 10.4* 10.1*  HCT 32.4* 32.3* 30.6*  MCV 108.4* 111.8* 109.3*  PLT 231 175 [id number]    Basic Metabolic Panel: Recent Labs  Lab 08/27/22 0123 08/28/22 0339 08/29/22  0430 08/30/22 0404  NA 132* 130* 133* 134*  K 3.7 3.4* 3.7 4.3  CL 102 100 98 101  CO2 17* 23 25 24  $ GLUCOSE 73 82 89 97  BUN 5* 11 7* 8  CREATININE 0.62 0.64 0.62 0.57*  CALCIUM 7.8* 7.6* 7.9* 8.2*  MG 0.8* 1.4* 1.7 1.8  PHOS  --  1.9* 3.4  --     Liver Function Tests: Recent Labs  Lab 08/27/22 0123 08/28/22 0339 08/29/22 0430  AST 36 32 29  ALT 15 14 13  $ ALKPHOS 113 92 80  BILITOT 1.2 1.1 1.0  PROT 7.2 6.4* 6.4*  ALBUMIN 2.7* 2.4* 2.4*    CBG: No results for input(s): "GLUCAP" in the last 168 hours.  Microbiology Studies:   Recent Results (from the past 240 hour(s))  Resp panel by RT-PCR (RSV, Flu A&B, Covid) Anterior Nasal Swab     Status: None   Collection Time: 08/27/22  1:41 AM   Specimen: Anterior Nasal Swab  Result Value Ref Range Status   SARS Coronavirus 2 by RT PCR NEGATIVE NEGATIVE  Final    Comment: (NOTE) SARS-CoV-2 target nucleic acids are NOT DETECTED.  The SARS-CoV-2 RNA is generally detectable in upper respiratory specimens during the acute phase of infection. The lowest concentration of SARS-CoV-2 viral copies this assay can detect is 138 copies/mL. A negative result does not preclude SARS-Cov-2 infection and should not be used as the sole basis for treatment or other patient management decisions. A negative result may occur with  improper specimen collection/handling, submission of specimen other than nasopharyngeal swab, presence of viral mutation(s) within the areas targeted by this assay, and inadequate number of viral copies(<138 copies/mL). A negative result must be combined with clinical observations, patient history, and epidemiological information. The expected result is Negative.  Fact Sheet for Patients:  EntrepreneurPulse.com.au  Fact Sheet for Healthcare Providers:  IncredibleEmployment.be  This test is no t yet approved or cleared by the [place] FDA and  has been authorized for detection and/or diagnosis of SARS-CoV-2 by FDA under an Emergency Use Authorization (EUA). This EUA will remain  in effect (meaning this test can be used) for the duration of the COVID-19 declaration under Section 564(b)(1) of the Act, 21 U.S.C.section 360bbb-3(b)(1), unless the authorization is terminated  or revoked sooner.       Influenza A by PCR NEGATIVE NEGATIVE Final   Influenza B by PCR NEGATIVE NEGATIVE Final    Comment: (NOTE) The Xpert Xpress SARS-CoV-2/FLU/RSV plus assay is intended as an aid in the diagnosis of influenza from Nasopharyngeal swab specimens and should not be used as a sole basis for treatment. Nasal washings and aspirates are unacceptable for Xpert Xpress SARS-CoV-2/FLU/RSV testing.  Fact Sheet for Patients: EntrepreneurPulse.com.au  Fact Sheet for Healthcare  Providers: IncredibleEmployment.be  This test is not yet approved or cleared by the [place] FDA and has been authorized for detection and/or diagnosis of SARS-CoV-2 by FDA under an Emergency Use Authorization (EUA). This EUA will remain in effect (meaning this test can be used) for the duration of the COVID-19 declaration under Section 564(b)(1) of the Act, 21 U.S.C. section 360bbb-3(b)(1), unless the authorization is terminated or revoked.     Resp Syncytial Virus by PCR NEGATIVE NEGATIVE Final    Comment: (NOTE) Fact Sheet for Patients: EntrepreneurPulse.com.au  Fact Sheet for Healthcare Providers: IncredibleEmployment.be  This test is not yet approved or cleared by the [place] FDA and has been authorized for detection and/or diagnosis of  SARS-CoV-2 by FDA under an Emergency Use Authorization (EUA). This EUA will remain in effect (meaning this test can be used) for the duration of the COVID-19 declaration under Section 564(b)(1) of the Act, 21 U.S.C. section 360bbb-3(b)(1), unless the authorization is terminated or revoked.  Performed at [facility], [place] [address], [place], [place] [address]   Culture, blood (routine x 2)     Status: None (Preliminary result)   Collection Time: 08/27/22  5:42 AM   Specimen: BLOOD LEFT HAND  Result Value Ref Range Status   Specimen Description   Final    BLOOD LEFT HAND Performed at [facility] [address], [place], [place] [address]    Special Requests   Final    BOTTLES DRAWN AEROBIC AND ANAEROBIC Blood Culture adequate volume Performed at [facility] [address], [place], [place] [address]    Culture   Final    NO GROWTH 3 DAYS Performed at [facility], [place] [address], [place], [place] [address]    Report Status PENDING  Incomplete  Culture, blood (routine x 2)     Status: None (Preliminary  result)   Collection Time: 08/27/22  6:00 AM   Specimen: BLOOD  Result Value Ref Range Status   Specimen Description   Final    BLOOD SITE NOT SPECIFIED Performed at [facility] [address] [place], [place] [address]    Special Requests   Final    BOTTLES DRAWN AEROBIC AND ANAEROBIC Blood Culture results may not be optimal due to an excessive volume of blood received in culture bottles Performed at [facility] [address], [place], [place] [address]    Culture   Final    NO GROWTH 3 DAYS Performed at [facility], [place] [address], [place], [place] [address]    Report Status PENDING  Incomplete  Expectorated Sputum Assessment w Gram Stain, Rflx to Resp Cult     Status: None   Collection Time: 08/27/22  2:01 PM   Specimen: Expectorated Sputum  Result Value Ref Range Status   Specimen Description EXPECTORATED SPUTUM  Final   Special Requests NONE  Final   Sputum evaluation   Final    THIS SPECIMEN IS ACCEPTABLE FOR SPUTUM CULTURE Performed at [facility], [place] [address], [place], [place] [address]    Report Status 08/27/2022 FINAL  Final  Culture, Respiratory w Gram Stain     Status: None   Collection Time: 08/27/22  2:01 PM  Result Value Ref Range Status   Specimen Description   Final    EXPECTORATED SPUTUM Performed at [facility] [address] [place], [place] [address]    Special Requests   Final    NONE Reflexed from [id number] Performed at [facility], [place] [address], [place], [place] [address]    Gram Stain   Final    FEW WBC PRESENT, PREDOMINANTLY PMN FEW GRAM POSITIVE COCCI    Culture   Final    Normal respiratory flora-no Staph aureus or Pseudomonas seen Performed at [facility] [address] [place], [place] [address]    Report Status 08/30/2022 FINAL  Final    Radiology Studies:  No results found.  Scheduled Meds:     amoxicillin-clavulanate  1 tablet Oral Q12H   enoxaparin (LOVENOX) injection  40 mg Subcutaneous Daily   folic acid  1 mg Oral Daily  LORazepam  0-4 mg Intravenous Q12H   multivitamin with minerals  1 tablet Oral Daily   pantoprazole  40 mg Oral QHS   thiamine  100 mg Oral Daily    Continuous Infusions:       LOS: 3 days     Vernell Leep, MD,  FACP, FHM, SFHM, [facility], [place]     To contact the attending provider between 7A-7P or the covering provider during after hours 7P-7A, please log into the web site [email or web address] and access using universal [place] password for that web site. If you do not have the password, please call the hospital operator.  08/30/2022, 4:46 PM

## 2022-08-30 NOTE — Evaluation (Signed)
Physical Therapy One Time Evaluation Patient Details Name: Anthony [name] MRN: [id number] DOB: 03/22/1956 Today's Date: 08/30/2022  History of Present Illness  67 year old male admitted 08/27/22 for aspiration pneumonia.  Past medical history significant for alcohol use disorder, prior DTs, anxiety and depression, chronic low back pain, unprovoked PE-completed Xarelto in 2019, HTN, tobacco use disorder.  CTA chest confirmed no PE but showed dependent airspace disease in the lower lobes of the lungs bilaterally most suggestive of aspiration pneumonitis and developing aspiration pneumonia.  Clinical Impression  Patient evaluated by Physical Therapy with no further acute PT needs identified. All education has been completed and the patient has no further questions.  Pt ambulated in hallway with RW and gait deficits decreased with use of RW (bil UE support) compared to no assistive device.  Pt questions if RW if good choice stating he needs to be able to carry things upon d/c and questions whether w/c is possibility.  Pt states he wants a PCP and someone he "trusts" to help decide assistive device for him.  See below for any follow-up Physical Therapy or equipment needs. PT is signing off. Thank you for this referral.        Recommendations for follow up therapy are one component of a multi-disciplinary discharge planning process, led by the attending physician.  Recommendations may be updated based on patient status, additional functional criteria and insurance authorization.  Follow Up Recommendations No PT follow up      Assistance Recommended at Discharge    Patient can return home with the following  Help with stairs or ramp for entrance    Equipment Recommendations Other (comment) (would benefit from RW, pt concerned with ability to carry things (homeless) and appears to want w/c)  Recommendations for Other Services       Functional Status Assessment Patient has not had a recent decline in  their functional status     Precautions / Restrictions Precautions Precautions: Fall      Mobility  Bed Mobility Overal bed mobility: Modified Independent                  Transfers Overall transfer level: Modified independent                      Ambulation/Gait Ambulation/Gait assistance: Supervision Gait Distance (Feet): 120 Feet Assistive device: Rolling walker (2 wheels) Gait Pattern/deviations: Step-through pattern, Decreased stride length, Trunk flexed, Narrow base of support, Shuffle Gait velocity: decr     General Gait Details: steady with RW, able to ambulate without assistive device however slower and smaller steps and appears unsteady so continued with RW  Stairs            Wheelchair Mobility    Modified Rankin (Stroke Patients Only)       Balance Overall balance assessment: History of Falls                                           Pertinent Vitals/Pain Pain Assessment Pain Assessment: Faces Faces Pain Scale: Hurts a Didio bit Pain Location: vague description Pain Intervention(s): Limited activity within patient's tolerance    Home Living Family/patient expects to be discharged to:: Shelter/Homeless                        Prior Function Prior Level of Function : Independent/Modified  Independent             Mobility Comments: reports not using assistive device although has had them in the past, states current difficulty walking to grocery store, feels he needs use of his arms to "carry things" ADLs Comments: reports "sleeping in the cold"     Hand Dominance        Extremity/Trunk Assessment        Lower Extremity Assessment Lower Extremity Assessment: Generalized weakness    Cervical / Trunk Assessment Cervical / Trunk Assessment: Kyphotic;Other exceptions Cervical / Trunk Exceptions: forward neck posture  Communication   Communication: HOH  Cognition Arousal/Alertness:  Awake/alert Behavior During Therapy: Flat affect Overall Cognitive Status: No family/caregiver present to determine baseline cognitive functioning                                          General Comments      Exercises     Assessment/Plan    PT Assessment Patient does not need any further PT services  PT Problem List         PT Treatment Interventions      PT Goals (Current goals can be found in the Care Plan section)  Acute Rehab PT Goals PT Goal Formulation: All assessment and education complete, DC therapy    Frequency       Co-evaluation               AM-PAC PT "6 Clicks" Mobility  Outcome Measure Help needed turning from your back to your side while in a flat bed without using bedrails?: None Help needed moving from lying on your back to sitting on the side of a flat bed without using bedrails?: None Help needed moving to and from a bed to a chair (including a wheelchair)?: None Help needed standing up from a chair using your arms (e.g., wheelchair or bedside chair)?: None Help needed to walk in hospital room?: A [place] Help needed climbing 3-5 steps with a railing? : A [place] 6 Click Score: 22    End of Session   Activity Tolerance: Patient tolerated treatment well Patient left: with call bell/phone within reach;in bed Nurse Communication: Mobility status PT Visit Diagnosis: Difficulty in walking, not elsewhere classified (R26.2)    Time: [id number] PT Time Calculation (min) (ACUTE ONLY): 13 min   Charges:   PT Evaluation $PT Eval Low Complexity: 1 Low         Kati PT, DPT Physical Therapist Acute Rehabilitation Services Preferred contact method: Secure Chat Weekend Pager Only: [phone] Office: [phone]   Myrtis Hopping [place] 08/30/2022, 11:44 AM

## 2022-08-31 DIAGNOSIS — I959 Hypotension, unspecified: Secondary | ICD-10-CM | POA: Diagnosis not present

## 2022-08-31 DIAGNOSIS — J690 Pneumonitis due to inhalation of food and vomit: Secondary | ICD-10-CM | POA: Diagnosis not present

## 2022-08-31 MED ORDER — SODIUM CHLORIDE 0.9 % IV BOLUS: 500.0000 mL | Freq: Once | INTRAVENOUS | Status: DC

## 2022-08-31 MED ORDER — METHOCARBAMOL 500 MG PO TABS
500.0000 mg | ORAL_TABLET | Freq: Three times a day (TID) | ORAL | Status: DC | PRN
  Administered 2022-08-31 – 2022-09-04 (×3): 500 mg via ORAL
  Filled 2022-08-31 (×3): qty 1

## 2022-08-31 MED ORDER — SODIUM CHLORIDE 0.9 % IV BOLUS
500.0000 mL | Freq: Once | INTRAVENOUS | Status: AC
  Administered 2022-08-31: 500 mL via INTRAVENOUS

## 2022-08-31 NOTE — Progress Notes (Signed)
Heart Failure Navigator Progress Note  Assessed for Heart & Vascular TOC clinic readiness.  Patient EF 55-60%, admission aspiration pneumonia, shortness of breath, ETOH with drawl. .   Navigator will sign off at this time.    Earnestine Leys, BSN, Clinical cytogeneticist Only

## 2022-08-31 NOTE — Care Management Important Message (Signed)
Important Message  Patient Details  Name: Anthony [name] MRN: [id number] Date of Birth: [birth year]   Medicare Important Message Given:  Yes     Memory Argue 08/31/2022, 4:12 PM

## 2022-08-31 NOTE — Progress Notes (Signed)
Mobility Specialist - Progress Note   08/31/22 1027  Mobility  Activity Ambulated with assistance in room;Transferred from bed to chair  Level of Assistance Contact guard assist, steadying assist  Assistive Device Front wheel walker  Distance Ambulated (ft) 2 ft  Range of Motion/Exercises Active  Activity Response Tolerated fair  Mobility Referral Yes  $Mobility charge 1 Mobility   Pt was found in bed and agreeable to ambulate. Once up and taking a couple of steps pt stated feeling dizzy and was advised to sit to check BP. Pt wanted to be supine and BP was checked to be 111/69 (84) mmHg and pulse 104 bpm. Pt was then transferred to recliner chair and stated still feeling dizzy. BP was checked to be 96/74 (82) mmHg and pulse was 91 bpm. Pt stated feeling okay to sit on recliner chair and was left with all necessities in reach. Chair alarm on.   Ferd Hibbs Mobility Specialist

## 2022-08-31 NOTE — Progress Notes (Signed)
PROGRESS NOTE   Anthony [name]  PT:[id number]    DOB: [birth year]    DOA: 08/27/2022  PCP: Jacelyn Grip, MD   I have briefly reviewed patients previous medical records in [facility].  Chief Complaint  Patient presents with   Shortness of Breath    Brief Narrative:  67 year old homeless male with medical history significant for alcohol use disorder, prior DTs, anxiety and depression, chronic low back pain, unprovoked PE-completed Xarelto in 2019, HTN, tobacco use disorder presented to the ED on 08/27/2022 with complaints of shortness of breath for last few months associated with on and off lower extremity edema, dyspnea worse over the last few days PTA.  Not on any meds PTA.  Reportedly drinks 84 ounces of beer daily.  CTA chest confirmed no PE but showed dependent airspace disease in the lower lobes of the lungs bilaterally most suggestive of aspiration pneumonitis and developing aspiration pneumonia.  Admitted for aspiration pneumonia.  Course complicated by alcohol withdrawal delirium.  Patient was medically stable for DC to [facility] over the weekend.  However on 2/12, dizzy with symptomatic hypotension/orthostatic hypotension.   Assessment & Plan:  Principal Problem:   Aspiration pneumonia ([place]) Active Problems:   Tobacco use disorder   Alcohol use disorder, severe, dependence (HCC)   Acute on chronic diastolic CHF (congestive heart failure) (HCC)   Paroxysmal atrial fibrillation (HCC)   Back pain   Dyslipidemia   Hypomagnesemia   HTN (hypertension)   Macrocytic anemia   Poor social situation   Hiatal hernia   History of pulmonary embolus (PE)   Malnutrition of moderate degree   Aortic atherosclerosis (HCC)  Hypotension/orthostatic hypotension: Dizzy this more thing with mobility specialist and was unable to walk about 2 steps.  BP around 11 AM, 86/60 sitting and?  49/37 standing and symptomatic.  Not on antihypertensives.  Unclear etiology.  Oral intake appears to be okay.  IV NS  bolus x 500 mL and placed bilateral leg compression stockings and reassess.  It appears that BP have improved.  Aspiration pneumonia: At risk due to history of alcohol use disorder.  On admission, tachypneic up to 30/min, tachycardic up to 120, normotensive but not hypoxic.  WBC 11.2.  COVID-19, flu, RSV PCR negative.  Urinary strep antigen negative.  D-dimer 1.82.  VBG pH of 7.46.  CTA chest 2/8: No PE.  Bibasal aspiration pneumonitis and developing aspiration pneumonia.  Although BNP elevated at 529.  Clinically or radiologically without CHF features.  Discontinued IV Lasix.  Continue empirically started IV Zosyn.  Sputum culture pending, reintubated for better growth.  Blood cultures x 2: Negative to date.  Aggressive pulmonary toilet. Lactate normal.  Mental status improved and probably back to his baseline.  Diet initiated.  Changed to oral Augmentin to complete total 7 days course.  Improved and stable.  Severe alcohol use disorder with withdrawal and delirium: As per H&P, no intention on quitting.  Had high CIWA scores on 2/9, improved mental status, alert and coherent without overt withdrawal signs, CIWA scores in the 4 range for the last 2 readings since this morning. Continue CIWA protocol including scheduled and as needed benzodiazepines.  Continue po thiamine, folate and oral multivitamins.  Has safety sitter at bedside, hopefully can be discontinued soon.  Off wrist restraints.  Serum acetaminophen, salicylate and blood alcohol levels negative on admission.  Interestingly had CIWA score of 11 at midnight but prior to that and since that time low CIWA scores including 1 and 0  today.?  Sundowning at night.  No safety sitter needed last night.  Seems to have resolved.  Very low CIWA scores.  Acute metabolic encephalopathy: Secondary to alcohol withdrawal.  No focal deficits. Delirium precautions.  Rest of management as above.  Resolved.  Multiple electrolyte abnormalities (hypokalemia,  hypophosphatemia and hypomagnesemia): On admission magnesium 0.8.  All resolved.  Hyponatremia: May be due to beer potomania.  Stable.  Macrocytic anemia: Suspect due to anemia of chronic disease.  Stable.   Tobacco use disorder: Chews tobacco does not smoke.  Reportedly has not been using recently.  Paroxysmal atrial fibrillation: CHA2DS2-VASc score of at least 4.  Remain in's in sinus rhythm.  Was not taking the amiodarone, metoprolol or Xarelto listed on his home meds or any of his prescription meds.  Not a safe candidate to start anticoagulation or amiodarone due to history of alcohol dependence, noncompliance. Since patient is in sinus rhythm, not taking metoprolol PTA, this was not resumed either.  Discontinued telemetry 2/10.  Dyslipidemia Not on meds PTA  Essential hypertension Not on meds PTA.  Controlled off meds here.  History of pulmonary embolism: No PE on CTA chest.  Continue Lovenox for DVT prophylaxis.  Hiatal hernia: Not on meds PTA.  Chronic back pain Judicious use of opioids.  Supportive care.  Patient states that he has L2-4 fracture and chronic back pain for which he takes Tylenol along with Robaxin and is requesting Robaxin to be added back on.  Homelessness TOC has been consulted.  As per discussion with TOC team, patient can go to [facility] (shelter[facility] tomorrow.  Body mass index is 19.45 kg/m.  Nutritional Status         DVT prophylaxis: Place TED hose Start: 08/31/22 1250 enoxaparin (LOVENOX) injection 40 mg Start: 08/28/22 1500 SCDs Start: 08/27/22 0802     Code Status: Full Code:  Family Communication: None at bedside Disposition:  Due to hypotension/orthostatic hypotension, patient not medically optimized today for DC.  Reassess in a.m.     Consultants:     Procedures:     Antimicrobials:   IV Zosyn 2/8 > was changed to Augmentin couple days ago.   Subjective:  .  History as noted above.  Dizziness and unable to ambulate.  Headache.   Nausea.  Chronic low back pain and asking for Robaxin along with Tylenol.  Objective:   Vitals:   08/31/22 1217 08/31/22 1221 08/31/22 1317 08/31/22 1426  BP: (!) 86/60 (!) 49/37 100/75 108/87  Pulse: 76  61 (!) 104  Resp: 16  18 18  $ Temp: 98.9 F (37.2 C)  99.4 F (37.4 C) 98.9 F (37.2 C)  TempSrc: Oral  Oral Oral  SpO2: 99%  100% 100%  Weight:      Height:        General exam: Middle-age male, looks older than stated age, moderately built, poorly nourished and frail, bitemporal wasting, decreased subcutaneous fat and limbs in other places, disheveled, sitting up in recliner chair without distress.  Appears to be in good spirits. Respiratory system: Clear to auscultation.  No increased work of breathing. Cardiovascular system: S1 and S2 heard, RRR. No JVD, murmurs, rubs, gallops or clicks. No pedal edema.  Off of telemetry. Gastrointestinal system: Abdomen is nondistended, soft and nontender. No organomegaly or masses felt. Normal bowel sounds heard. Central nervous system: Alert and oriented x 3. No focal neurological deficits. Extremities: Symmetric 5 x 5 power.  Off wrist restraints Skin: No rashes, lesions or ulcers Psychiatry: Judgement and  insight appears to be intact. Mood & affect pleasant and appropriate.   Data Reviewed:   I have personally reviewed following labs and imaging studies   CBC: Recent Labs  Lab 08/27/22 0123 08/28/22 0339 08/29/22 0430  WBC 11.2* 8.1 7.0  NEUTROABS 9.7*  --   --   HGB 10.7* 10.4* 10.1*  HCT 32.4* 32.3* 30.6*  MCV 108.4* 111.8* 109.3*  PLT 231 175 [id number]    Basic Metabolic Panel: Recent Labs  Lab 08/27/22 0123 08/28/22 0339 08/29/22 0430 08/30/22 0404  NA 132* 130* 133* 134*  K 3.7 3.4* 3.7 4.3  CL 102 100 98 101  CO2 17* 23 25 24  $ GLUCOSE 73 82 89 97  BUN 5* 11 7* 8  CREATININE 0.62 0.64 0.62 0.57*  CALCIUM 7.8* 7.6* 7.9* 8.2*  MG 0.8* 1.4* 1.7 1.8  PHOS  --  1.9* 3.4  --     Liver Function Tests: Recent Labs   Lab 08/27/22 0123 08/28/22 0339 08/29/22 0430  AST 36 32 29  ALT 15 14 13  $ ALKPHOS 113 92 80  BILITOT 1.2 1.1 1.0  PROT 7.2 6.4* 6.4*  ALBUMIN 2.7* 2.4* 2.4*    CBG: No results for input(s): "GLUCAP" in the last 168 hours.  Microbiology Studies:   Recent Results (from the past 240 hour(s))  Resp panel by RT-PCR (RSV, Flu A&B, Covid) Anterior Nasal Swab     Status: None   Collection Time: 08/27/22  1:41 AM   Specimen: Anterior Nasal Swab  Result Value Ref Range Status   SARS Coronavirus 2 by RT PCR NEGATIVE NEGATIVE Final    Comment: (NOTE) SARS-CoV-2 target nucleic acids are NOT DETECTED.  The SARS-CoV-2 RNA is generally detectable in upper respiratory specimens during the acute phase of infection. The lowest concentration of SARS-CoV-2 viral copies this assay can detect is 138 copies/mL. A negative result does not preclude SARS-Cov-2 infection and should not be used as the sole basis for treatment or other patient management decisions. A negative result may occur with  improper specimen collection/handling, submission of specimen other than nasopharyngeal swab, presence of viral mutation(s) within the areas targeted by this assay, and inadequate number of viral copies(<138 copies/mL). A negative result must be combined with clinical observations, patient history, and epidemiological information. The expected result is Negative.  Fact Sheet for Patients:  EntrepreneurPulse.com.au  Fact Sheet for Healthcare Providers:  IncredibleEmployment.be  This test is no t yet approved or cleared by the [place] FDA and  has been authorized for detection and/or diagnosis of SARS-CoV-2 by FDA under an Emergency Use Authorization (EUA). This EUA will remain  in effect (meaning this test can be used) for the duration of the COVID-19 declaration under Section 564(b)(1) of the Act, 21 U.S.C.section 360bbb-3(b)(1), unless the authorization is  terminated  or revoked sooner.       Influenza A by PCR NEGATIVE NEGATIVE Final   Influenza B by PCR NEGATIVE NEGATIVE Final    Comment: (NOTE) The Xpert Xpress SARS-CoV-2/FLU/RSV plus assay is intended as an aid in the diagnosis of influenza from Nasopharyngeal swab specimens and should not be used as a sole basis for treatment. Nasal washings and aspirates are unacceptable for Xpert Xpress SARS-CoV-2/FLU/RSV testing.  Fact Sheet for Patients: EntrepreneurPulse.com.au  Fact Sheet for Healthcare Providers: IncredibleEmployment.be  This test is not yet approved or cleared by the [place] FDA and has been authorized for detection and/or diagnosis of SARS-CoV-2 by FDA under an Emergency Use Authorization (EUA).  This EUA will remain in effect (meaning this test can be used) for the duration of the COVID-19 declaration under Section 564(b)(1) of the Act, 21 U.S.C. section 360bbb-3(b)(1), unless the authorization is terminated or revoked.     Resp Syncytial Virus by PCR NEGATIVE NEGATIVE Final    Comment: (NOTE) Fact Sheet for Patients: EntrepreneurPulse.com.au  Fact Sheet for Healthcare Providers: IncredibleEmployment.be  This test is not yet approved or cleared by the [place] FDA and has been authorized for detection and/or diagnosis of SARS-CoV-2 by FDA under an Emergency Use Authorization (EUA). This EUA will remain in effect (meaning this test can be used) for the duration of the COVID-19 declaration under Section 564(b)(1) of the Act, 21 U.S.C. section 360bbb-3(b)(1), unless the authorization is terminated or revoked.  Performed at [facility], [place] [address], [place], [place] [address]   Culture, blood (routine x 2)     Status: None (Preliminary result)   Collection Time: 08/27/22  5:42 AM   Specimen: BLOOD LEFT HAND  Result Value Ref Range Status   Specimen  Description   Final    BLOOD LEFT HAND Performed at [facility] [address], [place], [place] [address]    Special Requests   Final    BOTTLES DRAWN AEROBIC AND ANAEROBIC Blood Culture adequate volume Performed at [facility] [address] [place], [place] [address]    Culture   Final    NO GROWTH 4 DAYS Performed at [facility], [place] [address] [place], [place] [address]    Report Status PENDING  Incomplete  Culture, blood (routine x 2)     Status: None (Preliminary result)   Collection Time: 08/27/22  6:00 AM   Specimen: BLOOD  Result Value Ref Range Status   Specimen Description   Final    BLOOD SITE NOT SPECIFIED Performed at [facility] [address] [place], [place] [address]    Special Requests   Final    BOTTLES DRAWN AEROBIC AND ANAEROBIC Blood Culture results may not be optimal due to an excessive volume of blood received in culture bottles Performed at [facility] [address], [place], [place] [address]    Culture   Final    NO GROWTH 4 DAYS Performed at [facility], [place] [address] [place], [place] [address]    Report Status PENDING  Incomplete  Expectorated Sputum Assessment w Gram Stain, Rflx to Resp Cult     Status: None   Collection Time: 08/27/22  2:01 PM   Specimen: Expectorated Sputum  Result Value Ref Range Status   Specimen Description EXPECTORATED SPUTUM  Final   Special Requests NONE  Final   Sputum evaluation   Final    THIS SPECIMEN IS ACCEPTABLE FOR SPUTUM CULTURE Performed at [facility], [place] [address] [place], [place] [address]    Report Status 08/27/2022 FINAL  Final  Culture, Respiratory w Gram Stain     Status: None   Collection Time: 08/27/22  2:01 PM  Result Value Ref Range Status   Specimen Description   Final    EXPECTORATED SPUTUM Performed at [facility] [address]  [place], [place] [address]    Special Requests   Final    NONE Reflexed from [id number] Performed at [facility], [place] [address], [place], [place] [address]    Gram Stain   Final    FEW  WBC PRESENT, PREDOMINANTLY PMN FEW GRAM POSITIVE COCCI    Culture   Final    Normal respiratory flora-no Staph aureus or Pseudomonas seen Performed at [facility] [address] [place], [place] [address]    Report Status 08/30/2022 FINAL  Final  Culture, blood (Routine X 2) w Reflex to ID Panel     Status: None (Preliminary result)   Collection Time: 08/29/22 10:10 PM   Specimen: BLOOD  Result Value Ref Range Status   Specimen Description   Final    BLOOD BLOOD RIGHT ARM Performed at [facility] [address] [place], [place] [address]    Special Requests   Final    BOTTLES DRAWN AEROBIC AND ANAEROBIC Blood Culture adequate volume Performed at [facility] [address], [place], [place] [address]    Culture   Final    NO GROWTH 1 DAY Performed at [facility], [place] [address] [place] [place], [place] [address]    Report Status PENDING  Incomplete  Culture, blood (Routine X 2) w Reflex to ID Panel     Status: None (Preliminary result)   Collection Time: 08/29/22 10:10 PM   Specimen: BLOOD  Result Value Ref Range Status   Specimen Description   Final    BLOOD BLOOD LEFT ARM Performed at [facility] [address], [place], [place] [address]    Special Requests   Final    BOTTLES DRAWN AEROBIC AND ANAEROBIC Blood Culture adequate volume Performed at [facility] [address], [place], [place] [address]    Culture   Final    NO GROWTH 1 DAY Performed at [facility], [place] [address], [place],  [address]    Report Status PENDING  Incomplete    Radiology Studies:  No results found.  Scheduled Meds:    amoxicillin-clavulanate  1 tablet Oral Q12H   enoxaparin (LOVENOX)  injection  40 mg Subcutaneous Daily   folic acid  1 mg Oral Daily   LORazepam  0-4 mg Intravenous Q12H   multivitamin with minerals  1 tablet Oral Daily   pantoprazole  40 mg Oral QHS   thiamine  100 mg Oral Daily    Continuous Infusions:       LOS: 4 days     Vernell Leep, MD,  FACP, FHM, SFHM, [facility], [place]     To contact the attending provider between 7A-7P or the covering provider during after hours 7P-7A, please log into the web site [email or web address] and access using universal [place] password for that web site. If you do not have the password, please call the hospital operator.  08/31/2022, 3:07 PM

## 2022-08-31 NOTE — Progress Notes (Signed)
Dr. Algis Liming notified of patient's low BP. IVF/TED hose ordered, given. Will continue to assess patient.    08/31/22 1217  Assess: MEWS Score  Temp 98.9 F (37.2 C)  BP (!) 86/60  MAP (mmHg) 70  Pulse Rate 76  Resp 16  Level of Consciousness Alert  SpO2 99 %  O2 Device Room Air  Assess: MEWS Score  MEWS Temp 0  MEWS Systolic 1  MEWS Pulse 0  MEWS RR 0  MEWS LOC 0  MEWS Score 1  MEWS Score Color Green  Assess: if the MEWS score is Yellow or Red  Were vital signs taken at a resting state? Yes  Focused Assessment Change from prior assessment (see assessment flowsheet)  Does the patient meet 2 or more of the SIRS criteria? No  MEWS guidelines implemented  Yes, yellow  Treat  MEWS Interventions Considered administering scheduled or prn medications/treatments as ordered  Take Vital Signs  Increase Vital Sign Frequency  Yellow: Q2hr x1, continue Q4hrs until patient remains green for 12hrs  Escalate  MEWS: Escalate Yellow: Discuss with charge nurse and consider notifying provider and/or RRT  Provider Notification  Provider Name/Title Dr. Algis Liming  Date Provider Notified 08/31/22  Time Provider Notified 1217  Method of Notification Face-to-face  Notification Reason Other (Comment) (Low BP in the 80s)  Provider response See new orders  Date of Provider Response 08/31/22  Time of Provider Response 1217  Assess: SIRS CRITERIA  SIRS Temperature  0  SIRS Pulse 0  SIRS Respirations  0  SIRS WBC 0  SIRS Score Sum  0

## 2022-09-01 DIAGNOSIS — J690 Pneumonitis due to inhalation of food and vomit: Secondary | ICD-10-CM | POA: Diagnosis not present

## 2022-09-01 DIAGNOSIS — I959 Hypotension, unspecified: Secondary | ICD-10-CM | POA: Diagnosis not present

## 2022-09-01 LAB — CBC
HCT: 33.8 % — ABNORMAL LOW (ref 39.0–52.0)
Hemoglobin: 10.8 g/dL — ABNORMAL LOW (ref 13.0–17.0)
MCH: 35.2 pg — ABNORMAL HIGH (ref 26.0–34.0)
MCHC: 32 g/dL (ref 30.0–36.0)
MCV: 110.1 fL — ABNORMAL HIGH (ref 80.0–100.0)
Platelets: 202 10*3/uL (ref 150–400)
RBC: 3.07 MIL/uL — ABNORMAL LOW (ref 4.22–5.81)
RDW: 16.4 % — ABNORMAL HIGH (ref 11.5–15.5)
WBC: 6.8 10*3/uL (ref 4.0–10.5)
nRBC: 0 % (ref 0.0–0.2)

## 2022-09-01 LAB — CULTURE, BLOOD (ROUTINE X 2)
Culture: NO GROWTH
Culture: NO GROWTH
Special Requests: ADEQUATE

## 2022-09-01 LAB — BASIC METABOLIC PANEL
Anion gap: 10 (ref 5–15)
BUN: 10 mg/dL (ref 8–23)
CO2: 23 mmol/L (ref 22–32)
Calcium: 8.5 mg/dL — ABNORMAL LOW (ref 8.9–10.3)
Chloride: 98 mmol/L (ref 98–111)
Creatinine, Ser: 0.65 mg/dL (ref 0.61–1.24)
GFR, Estimated: >60 mL/min (ref >60)
Glucose, Bld: 107 mg/dL — ABNORMAL HIGH (ref 70–99)
Potassium: 3.7 mmol/L (ref 3.5–5.1)
Sodium: 131 mmol/L — ABNORMAL LOW (ref 135–145)

## 2022-09-01 LAB — MAGNESIUM
Magnesium: 1.3 mg/dL — ABNORMAL LOW (ref 1.7–2.4)
Magnesium: 2.5 mg/dL — ABNORMAL HIGH (ref 1.7–2.4)

## 2022-09-01 LAB — PHOSPHORUS: Phosphorus: 3.8 mg/dL (ref 2.5–4.6)

## 2022-09-01 MED ORDER — MIDODRINE HCL 5 MG PO TABS
5.0000 mg | ORAL_TABLET | Freq: Three times a day (TID) | ORAL | Status: DC
  Administered 2022-09-01 – 2022-09-02 (×3): 5 mg via ORAL
  Filled 2022-09-01 (×3): qty 1

## 2022-09-01 MED ORDER — MAGNESIUM SULFATE 4 GM/100ML IV SOLN
4.0000 g | Freq: Once | INTRAVENOUS | Status: AC
  Administered 2022-09-01: 4 g via INTRAVENOUS
  Filled 2022-09-01: qty 100

## 2022-09-01 NOTE — Progress Notes (Signed)
   09/01/22 0631  Vitals  Temp 98.2 F (36.8 C)  Temp Source Oral  Resp 18  MEWS COLOR  MEWS Score Color Green  Orthostatic Lying   BP- Lying 90/60  Pulse- Lying 103  Orthostatic Sitting  BP- Sitting 96/85  Pulse- Sitting 74  Orthostatic Standing at 0 minutes  BP- Standing at 0 minutes (!) 113/91  Pulse- Standing at 0 minutes 61  Orthostatic Standing at 3 minutes  BP- Standing at 3 minutes  (unable to do the standing at 3 minutes due to dizziness)  Pulse- Standing at 3 minutes  (unable to do due to dizziness)  Oxygen Therapy  SpO2 100 %  O2 Device Room Air  Height and Weight  Weight 60.5 kg  Type of Scale Used Bed  Type of Weight Actual  BMI (Calculated) 19.14  MEWS Score  MEWS Temp 0  MEWS Systolic 0  MEWS Pulse 0  MEWS RR 0  MEWS LOC 0  MEWS Score 0

## 2022-09-01 NOTE — Progress Notes (Signed)
PROGRESS NOTE   Anthony [name]  [id number]    DOB: [birth year]    DOA: 08/27/2022  PCP: Jacelyn Grip, MD   I have briefly reviewed patients previous medical records in [facility].  Chief Complaint  Patient presents with   Shortness of Breath    Brief Narrative:  67 year old homeless male with medical history significant for alcohol use disorder, prior DTs, anxiety and depression, chronic low back pain, unprovoked PE-completed Xarelto in 2019, HTN, tobacco use disorder presented to the ED on 08/27/2022 with complaints of shortness of breath for last few months associated with on and off lower extremity edema, dyspnea worse over the last few days PTA.  Not on any meds PTA.  Reportedly drinks 84 ounces of beer daily.  CTA chest confirmed no PE but showed dependent airspace disease in the lower lobes of the lungs bilaterally most suggestive of aspiration pneumonitis and developing aspiration pneumonia.  Admitted for aspiration pneumonia.  Course complicated by alcohol withdrawal delirium.  Patient was medically stable for DC to [facility] over the weekend.  However on 2/12, dizzy with symptomatic hypotension/orthostatic hypotension, ongoing issues with hypotension.   Assessment & Plan:  Principal Problem:   Aspiration pneumonia ([place]) Active Problems:   Tobacco use disorder   Alcohol use disorder, severe, dependence (HCC)   Acute on chronic diastolic CHF (congestive heart failure) (HCC)   Paroxysmal atrial fibrillation (HCC)   Back pain   Dyslipidemia   Hypomagnesemia   HTN (hypertension)   Macrocytic anemia   Poor social situation   Hiatal hernia   History of pulmonary embolus (PE)   Malnutrition of moderate degree   Aortic atherosclerosis (HCC)  Hypotension/orthostatic hypotension: Hypotensive/orthostatic on 2/12.  Not on antihypertensives.  Bolused with NS 500 mL x 1 and improved.  Again 2/13, patient unable to perform 3 minutes stand due to dizziness.  Afternoon BP 89/60.  Clinically  euvolemic.  Check a.m. cortisol.  Started midodrine 5 Mg 3 times daily.  Until patient is able to steadily ambulate independently, unsafe to DC to homeless shelter.  Aspiration pneumonia: At risk due to history of alcohol use disorder.  On admission, tachypneic up to 30/min, tachycardic up to 120, normotensive but not hypoxic.  WBC 11.2.  COVID-19, flu, RSV PCR negative.  Urinary strep antigen negative.  D-dimer 1.82.  VBG pH of 7.46.  CTA chest 2/8: No PE.  Bibasal aspiration pneumonitis and developing aspiration pneumonia.  Although BNP elevated at 529.  Clinically or radiologically without CHF features.  Discontinued IV Lasix.  Continue empirically started IV Zosyn.  Sputum culture pending, reintubated for better growth.  Blood cultures x 2: Negative to date.  Aggressive pulmonary toilet. Lactate normal.  Mental status improved and probably back to his baseline.  Diet initiated.  Changed to oral Augmentin to complete total 7 days course.  Improved and stable.  Severe alcohol use disorder with withdrawal and delirium: As per H&P, no intention on quitting.  Had high CIWA scores with overt withdrawal early on in admission.  This has resolved.  Safety sitter and restraints have been off for a couple days.  Continue po thiamine, folate and oral multivitamins.  Serum acetaminophen, salicylate and blood alcohol levels negative on admission.    Acute metabolic encephalopathy: Secondary to alcohol withdrawal.  Resolved.  Multiple electrolyte abnormalities (hypokalemia, hypophosphatemia and hypomagnesemia): On admission magnesium 0.8.  Recurrent hypomagnesemia, replaced and corrected.  Hyponatremia: May be due to beer potomania.  Stable.  Macrocytic anemia: Suspect due to anemia  of chronic disease.  Stable.   Tobacco use disorder: Chews tobacco does not smoke.  Reportedly has not been using recently.  Paroxysmal atrial fibrillation: CHA2DS2-VASc score of at least 4.  Remain in's in sinus rhythm.   Was not taking the amiodarone, metoprolol or Xarelto listed on his home meds or any of his prescription meds.  Not a safe candidate to start anticoagulation or amiodarone due to history of alcohol dependence, noncompliance. Since patient is in sinus rhythm, not taking metoprolol PTA, this was not resumed either.  Discontinued telemetry 2/10.  Dyslipidemia Not on meds PTA  Essential hypertension Not on meds PTA.  Hypotensive now.  History of pulmonary embolism: No PE on CTA chest.  Continue Lovenox for DVT prophylaxis.  Hiatal hernia: Not on meds PTA.  Chronic back pain Judicious use of opioids.  Supportive care.  Patient states that he has L2-4 fracture and chronic back pain for which he takes Tylenol along with Robaxin and is requesting Robaxin to be added back on.  Homelessness TOC has been consulted.  As per discussion with TOC team, patient can go to [facility] (shelter) when stable.    As per d/w TOC> IRC the day center.  It is open Monday to Friday from 8 AM-3 PM, close this from 3 PM to 8 PM and then again open from 8 AM to 8 AM the next day.  It is also closed on weekends.  She indicates that there are no other shelter options and he is likely already on the waiting list.  Patient has been provided with all resources for homeless shelters at discharge.  Body mass index is 19.14 kg/m.  Nutritional Status         DVT prophylaxis: Place TED hose Start: 08/31/22 1250 enoxaparin (LOVENOX) injection 40 mg Start: 08/28/22 1500 SCDs Start: 08/27/22 0802     Code Status: Full Code:  Family Communication: None at bedside Disposition:  Due to hypotension/orthostatic hypotension, patient not medically optimized today for DC.  Reassess in a.m.     Consultants:     Procedures:     Antimicrobials:   IV Zosyn 2/8 > was changed to Augmentin couple days ago.   Subjective:  Having difficulty standing up for orthostatics this morning with dizziness.  RN yet to ambulate him.    Objective:   Vitals:   08/31/22 2248 09/01/22 0307 09/01/22 0631 09/01/22 1317  BP: 111/77 104/70  (!) 89/60  Pulse: 97 99  99  Resp: 18 18 18 20  $ Temp: 99.1 F (37.3 C) 97.6 F (36.4 C) 98.2 F (36.8 C) 98.2 F (36.8 C)  TempSrc: Oral Oral Oral Oral  SpO2: 97% 100% 100% 97%  Weight:   60.5 kg   Height:        General exam: Middle-age male, looks older than stated age, moderately built, poorly nourished and frail, bitemporal wasting, decreased subcutaneous fat and limbs in other places, sitting up comfortably in bed without distress. Respiratory system: Clear to auscultation.  No increased work of breathing. Cardiovascular system: S1 and S2 heard, RRR. No JVD, murmurs, rubs, gallops or clicks. No pedal edema.  Off of telemetry. Gastrointestinal system: Abdomen is nondistended, soft and nontender. No organomegaly or masses felt. Normal bowel sounds heard. Central nervous system: Alert and oriented x 3. No focal neurological deficits. Extremities: Symmetric 5 x 5 power.  Off wrist restraints Skin: No rashes, lesions or ulcers Psychiatry: Judgement and insight appears to be intact. Mood & affect pleasant and appropriate.  Data Reviewed:   I have personally reviewed following labs and imaging studies   CBC: Recent Labs  Lab 08/27/22 0123 08/28/22 0339 08/29/22 0430 09/01/22 0417  WBC 11.2* 8.1 7.0 6.8  NEUTROABS 9.7*  --   --   --   HGB 10.7* 10.4* 10.1* 10.8*  HCT 32.4* 32.3* 30.6* 33.8*  MCV 108.4* 111.8* 109.3* 110.1*  PLT 231 175 154 [id number]    Basic Metabolic Panel: Recent Labs  Lab 08/27/22 0123 08/28/22 0339 08/29/22 0430 08/30/22 0404 09/01/22 0417 09/01/22 1201  NA 132* 130* 133* 134* 131*  --   K 3.7 3.4* 3.7 4.3 3.7  --   CL 102 100 98 101 98  --   CO2 17* 23 25 24 23  $ --   GLUCOSE 73 82 89 97 107*  --   BUN 5* 11 7* 8 10  --   CREATININE 0.62 0.64 0.62 0.57* 0.65  --   CALCIUM 7.8* 7.6* 7.9* 8.2* 8.5*  --   MG 0.8* 1.4* 1.7 1.8 1.3* 2.5*  PHOS   --  1.9* 3.4  --  3.8  --     Liver Function Tests: Recent Labs  Lab 08/27/22 0123 08/28/22 0339 08/29/22 0430  AST 36 32 29  ALT 15 14 13  $ ALKPHOS 113 92 80  BILITOT 1.2 1.1 1.0  PROT 7.2 6.4* 6.4*  ALBUMIN 2.7* 2.4* 2.4*    CBG: No results for input(s): "GLUCAP" in the last 168 hours.  Microbiology Studies:   Recent Results (from the past 240 hour(s))  Resp panel by RT-PCR (RSV, Flu A&B, Covid) Anterior Nasal Swab     Status: None   Collection Time: 08/27/22  1:41 AM   Specimen: Anterior Nasal Swab  Result Value Ref Range Status   SARS Coronavirus 2 by RT PCR NEGATIVE NEGATIVE Final    Comment: (NOTE) SARS-CoV-2 target nucleic acids are NOT DETECTED.  The SARS-CoV-2 RNA is generally detectable in upper respiratory specimens during the acute phase of infection. The lowest concentration of SARS-CoV-2 viral copies this assay can detect is 138 copies/mL. A negative result does not preclude SARS-Cov-2 infection and should not be used as the sole basis for treatment or other patient management decisions. A negative result may occur with  improper specimen collection/handling, submission of specimen other than nasopharyngeal swab, presence of viral mutation(s) within the areas targeted by this assay, and inadequate number of viral copies(<138 copies/mL). A negative result must be combined with clinical observations, patient history, and epidemiological information. The expected result is Negative.  Fact Sheet for Patients:  EntrepreneurPulse.com.au  Fact Sheet for Healthcare Providers:  IncredibleEmployment.be  This test is no t yet approved or cleared by the [place] FDA and  has been authorized for detection and/or diagnosis of SARS-CoV-2 by FDA under an Emergency Use Authorization (EUA). This EUA will remain  in effect (meaning this test can be used) for the duration of the COVID-19 declaration under Section 564(b)(1) of the  Act, 21 U.S.C.section 360bbb-3(b)(1), unless the authorization is terminated  or revoked sooner.       Influenza A by PCR NEGATIVE NEGATIVE Final   Influenza B by PCR NEGATIVE NEGATIVE Final    Comment: (NOTE) The Xpert Xpress SARS-CoV-2/FLU/RSV plus assay is intended as an aid in the diagnosis of influenza from Nasopharyngeal swab specimens and should not be used as a sole basis for treatment. Nasal washings and aspirates are unacceptable for Xpert Xpress SARS-CoV-2/FLU/RSV testing.  Fact Sheet for Patients: EntrepreneurPulse.com.au  Fact Sheet for Healthcare Providers: IncredibleEmployment.be  This test is not yet approved or cleared by the [place] FDA and has been authorized for detection and/or diagnosis of SARS-CoV-2 by FDA under an Emergency Use Authorization (EUA). This EUA will remain in effect (meaning this test can be used) for the duration of the COVID-19 declaration under Section 564(b)(1) of the Act, 21 U.S.C. section 360bbb-3(b)(1), unless the authorization is terminated or revoked.     Resp Syncytial Virus by PCR NEGATIVE NEGATIVE Final    Comment: (NOTE) Fact Sheet for Patients: EntrepreneurPulse.com.au  Fact Sheet for Healthcare Providers: IncredibleEmployment.be  This test is not yet approved or cleared by the [place] FDA and has been authorized for detection and/or diagnosis of SARS-CoV-2 by FDA under an Emergency Use Authorization (EUA). This EUA will remain in effect (meaning this test can be used) for the duration of the COVID-19 declaration under Section 564(b)(1) of the Act, 21 U.S.C. section 360bbb-3(b)(1), unless the authorization is terminated or revoked.  Performed at [facility], [place] [address], [place], [place] [address]   Culture, blood (routine x 2)     Status: None   Collection Time: 08/27/22  5:42 AM   Specimen: BLOOD LEFT HAND   Result Value Ref Range Status   Specimen Description   Final    BLOOD LEFT HAND Performed at [place] [address], [place], [place] [address]    Special Requests   Final    BOTTLES DRAWN AEROBIC AND ANAEROBIC Blood Culture adequate volume Performed at [place] [address] [place], [place] [address]    Culture   Final    NO GROWTH 5 DAYS Performed at [facility], [place] [address], [place], [place] [address]    Report Status 09/01/2022 FINAL  Final  Culture, blood (routine x 2)     Status: None   Collection Time: 08/27/22  6:00 AM   Specimen: BLOOD  Result Value Ref Range Status   Specimen Description   Final    BLOOD SITE NOT SPECIFIED Performed at [place] [address], [place], [place] [address]    Special Requests   Final    BOTTLES DRAWN AEROBIC AND ANAEROBIC Blood Culture results may not be optimal due to an excessive volume of blood received in culture bottles Performed at [place] [address], [place], [place] [address]    Culture   Final    NO GROWTH 5 DAYS Performed at [facility], [place] [address], [place], [place] [address]    Report Status 09/01/2022 FINAL  Final  Expectorated Sputum Assessment w Gram Stain, Rflx to Resp Cult     Status: None   Collection Time: 08/27/22  2:01 PM   Specimen: Expectorated Sputum  Result Value Ref Range Status   Specimen Description EXPECTORATED SPUTUM  Final   Special Requests NONE  Final   Sputum evaluation   Final    THIS SPECIMEN IS ACCEPTABLE FOR SPUTUM CULTURE Performed at [facility], [place] [address], [place], [place] [address]    Report Status 08/27/2022 FINAL  Final  Culture, Respiratory w Gram Stain     Status: None   Collection Time: 08/27/22  2:01 PM  Result Value Ref Range Status   Specimen Description   Final    EXPECTORATED SPUTUM Performed at [place] [address], [place], [place] [address]    Special  Requests   Final    NONE Reflexed from [phone] Performed at [facility], [place] [address] [place], [place] [address]    Gram Stain   Final    FEW WBC PRESENT, PREDOMINANTLY PMN FEW GRAM POSITIVE COCCI    Culture   Final    Normal respiratory flora-no Staph aureus or Pseudomonas seen Performed at [place] [address], [place], [place] [address]    Report Status 08/30/2022 FINAL  Final  Culture, blood (Routine X 2) w Reflex to ID Panel     Status: None (Preliminary result)   Collection Time: 08/29/22 10:10 PM   Specimen: BLOOD  Result Value Ref Range Status   Specimen Description   Final    BLOOD BLOOD RIGHT ARM Performed at [name] [address], [place], [place] [address]    Special Requests   Final    BOTTLES DRAWN AEROBIC AND ANAEROBIC Blood Culture adequate volume Performed at [place] [address], [place], [place] [address]    Culture   Final    NO GROWTH 2 DAYS Performed at [place] [address], [place], [place] [address]    Report Status PENDING  Incomplete  Culture, blood (Routine X 2) w Reflex to ID Panel     Status: None (Preliminary result)   Collection Time: 08/29/22 10:10 PM   Specimen: BLOOD  Result Value Ref Range Status   Specimen Description   Final    BLOOD BLOOD LEFT ARM Performed at [place] [facility] [place] [address] [place], [place] [address]    Special Requests   Final    BOTTLES DRAWN AEROBIC AND ANAEROBIC Blood Culture adequate volume Performed at [place] [address] [place], [place] [address]    Culture   Final    NO GROWTH 2 DAYS Performed at [place] [address] [place], [place] [address]    Report Status PENDING  Incomplete    Radiology Studies:  No results found.  Scheduled Meds:    amoxicillin-clavulanate  1 tablet Oral Q12H    enoxaparin (LOVENOX) injection  40 mg Subcutaneous Daily   folic acid  1 mg Oral Daily   midodrine  5 mg Oral TID WC   multivitamin with minerals  1 tablet Oral Daily   pantoprazole  40 mg Oral QHS   thiamine  100 mg Oral Daily    Continuous Infusions:       LOS: 5 days     Vernell Leep, MD,  FACP, FHM, SFHM, [facility], [place]     To contact the attending provider between 7A-7P or the covering provider during after hours 7P-7A, please log into the web site [email or web address] and access using universal [place] password for that web site. If you do not have the password, please call the hospital operator.  09/01/2022, 2:01 PM

## 2022-09-01 NOTE — Plan of Care (Signed)
  Problem: Education: Goal: Knowledge of General Education information will improve Description: Including pain rating scale, medication(s)/side effects and non-pharmacologic comfort measures Outcome: Progressing   Problem: Coping: Goal: Level of anxiety will decrease Outcome: Progressing   Problem: Pain Managment: Goal: General experience of comfort will improve Outcome: Progressing   Problem: Safety: Goal: Ability to remain free from injury will improve Outcome: Progressing   Problem: Skin Integrity: Goal: Risk for impaired skin integrity will decrease Outcome: Progressing   Problem: Education: Goal: Knowledge of General Education information will improve Description: Including pain rating scale, medication(s)/side effects and non-pharmacologic comfort measures Outcome: Progressing   Problem: Safety: Goal: Non-violent Restraint(s) Outcome: Completed/Met

## 2022-09-01 NOTE — TOC Progression Note (Signed)
Transition of Care [facility]) - Progression Note    Patient Details  Name: HALEEM SEMELSBERGER MRN: [id number] Date of Birth: 1956/07/02  [facility] CM/SW [place], LCSW Phone Number: 09/01/2022, 3:50 PM  Clinical Narrative:     CSW spoke with pt regarding discharge, pt had questions about the [facility]. CSW informed pt about the updated IRC hours. Pt also inquired about OP Mental Health resources . CSW informed pt all resources has been added to his d/c paperwork. Pt stated he is not sure if he will need transportation assistance at d/c. TOC informed pt to just let his nurse know so she can contact TOC for bus pass. TOC to follow  Expected Discharge Plan: Homeless Shelter Barriers to Discharge: Continued Medical Work up  Expected Discharge Plan and Services   Discharge Planning Services: CM Consult   Living arrangements for the past 2 months: Homeless Shelter                                       Social Determinants of Health (SDOH) Interventions SDOH Screenings   Food Insecurity: No Food Insecurity (08/27/2022)  Housing: High Risk (08/27/2022)  Transportation Needs: Unmet Transportation Needs (08/27/2022)  Utilities: Not At Risk (08/27/2022)  Depression (PHQ2-9): Medium Risk (10/16/2020)  Tobacco Use: High Risk (08/27/2022)    Readmission Risk Interventions    08/28/2022    2:00 PM 08/27/2022   10:30 AM 08/27/2022   10:08 AM  Readmission Risk Prevention Plan  Transportation Screening Complete Complete   Medication Review Press photographer)  Complete   PCP or Specialist appointment within 3-5 days of discharge Complete Complete Complete  HRI or Home Care Consult  Complete Complete  SW Recovery Care/Counseling Consult Complete Complete Complete  Palliative Care Screening Not Applicable Not Applicable Complete  [place] Not Applicable Not Applicable Not Applicable

## 2022-09-02 DIAGNOSIS — J690 Pneumonitis due to inhalation of food and vomit: Secondary | ICD-10-CM | POA: Diagnosis not present

## 2022-09-02 LAB — BASIC METABOLIC PANEL
Anion gap: 10 (ref 5–15)
BUN: 12 mg/dL (ref 8–23)
CO2: 21 mmol/L — ABNORMAL LOW (ref 22–32)
Calcium: 8.8 mg/dL — ABNORMAL LOW (ref 8.9–10.3)
Chloride: 100 mmol/L (ref 98–111)
Creatinine, Ser: 0.81 mg/dL (ref 0.61–1.24)
GFR, Estimated: >60 mL/min (ref >60)
Glucose, Bld: 96 mg/dL (ref 70–99)
Potassium: 3.8 mmol/L (ref 3.5–5.1)
Sodium: 131 mmol/L — ABNORMAL LOW (ref 135–145)

## 2022-09-02 LAB — CORTISOL: Cortisol, Plasma: 15 ug/dL

## 2022-09-02 LAB — MAGNESIUM: Magnesium: 1.9 mg/dL (ref 1.7–2.4)

## 2022-09-02 LAB — TSH: TSH: 1.712 u[IU]/mL (ref 0.350–4.500)

## 2022-09-02 MED ORDER — LORATADINE 10 MG PO TABS: 10.0000 mg | ORAL_TABLET | Freq: Every day | ORAL | Status: DC | PRN

## 2022-09-02 MED ORDER — HYDROCORTISONE 1 % EX CREA: 1.0000 | TOPICAL_CREAM | Freq: Three times a day (TID) | CUTANEOUS | Status: DC | PRN

## 2022-09-02 MED ORDER — LIP MEDEX EX OINT: 1.0000 | TOPICAL_OINTMENT | CUTANEOUS | Status: DC | PRN

## 2022-09-02 MED ORDER — SALINE SPRAY 0.65 % NA SOLN: 1.0000 | NASAL | Status: DC | PRN

## 2022-09-02 MED ORDER — ALUM & MAG HYDROXIDE-SIMETH 200-200-20 MG/5ML PO SUSP: 30.0000 mL | ORAL | Status: DC | PRN

## 2022-09-02 MED ORDER — HYDRALAZINE HCL 20 MG/ML IJ SOLN: 10.0000 mg | INTRAMUSCULAR | Status: DC | PRN

## 2022-09-02 MED ORDER — HYDROCORTISONE (PERIANAL) 2.5 % EX CREA: 1.0000 | TOPICAL_CREAM | Freq: Four times a day (QID) | CUTANEOUS | Status: DC | PRN

## 2022-09-02 MED ORDER — SODIUM CHLORIDE 0.9 % IV SOLN: INTRAVENOUS | Status: DC

## 2022-09-02 MED ORDER — TRAZODONE HCL 50 MG PO TABS
50.0000 mg | ORAL_TABLET | Freq: Every evening | ORAL | Status: DC | PRN
  Administered 2022-09-04: 50 mg via ORAL
  Filled 2022-09-02: qty 1

## 2022-09-02 MED ORDER — POLYVINYL ALCOHOL 1.4 % OP SOLN: 1.0000 [drp] | OPHTHALMIC | Status: DC | PRN

## 2022-09-02 MED ORDER — METOPROLOL TARTRATE 5 MG/5ML IV SOLN: 5.0000 mg | INTRAVENOUS | Status: DC | PRN

## 2022-09-02 MED ORDER — SENNOSIDES-DOCUSATE SODIUM 8.6-50 MG PO TABS: 1.0000 | ORAL_TABLET | Freq: Every evening | ORAL | Status: DC | PRN

## 2022-09-02 MED ORDER — MUSCLE RUB 10-15 % EX CREA: 1.0000 | TOPICAL_CREAM | CUTANEOUS | Status: DC | PRN

## 2022-09-02 MED ORDER — ONDANSETRON HCL 4 MG/2ML IJ SOLN: 4.0000 mg | Freq: Four times a day (QID) | INTRAMUSCULAR | Status: DC | PRN

## 2022-09-02 MED ORDER — PHENOL 1.4 % MT LIQD: 1.0000 | OROMUCOSAL | Status: DC | PRN

## 2022-09-02 MED ORDER — GUAIFENESIN 100 MG/5ML PO LIQD: 5.0000 mL | ORAL | Status: DC | PRN

## 2022-09-02 MED ORDER — MIDODRINE HCL 5 MG PO TABS
10.0000 mg | ORAL_TABLET | Freq: Three times a day (TID) | ORAL | Status: DC
  Administered 2022-09-02 – 2022-09-05 (×9): 10 mg via ORAL
  Filled 2022-09-02 (×10): qty 2

## 2022-09-02 MED ORDER — SODIUM CHLORIDE 0.9 % IV BOLUS
1000.0000 mL | Freq: Once | INTRAVENOUS | Status: AC
  Administered 2022-09-02: 1000 mL via INTRAVENOUS

## 2022-09-02 NOTE — Plan of Care (Signed)
  Problem: Education: Goal: Knowledge of General Education information will improve Description: Including pain rating scale, medication(s)/side effects and non-pharmacologic comfort measures Outcome: Progressing   Problem: Coping: Goal: Level of anxiety will decrease Outcome: Progressing   Problem: Safety: Goal: Ability to remain free from injury will improve Outcome: Progressing   

## 2022-09-02 NOTE — Progress Notes (Signed)
PROGRESS NOTE    Anthony [name]  PT:[id number] DOB: [birth year] DOA: 08/27/2022 PCP: Jacelyn Grip, MD   Brief Narrative:  67 year old homeless male with medical history significant for alcohol use disorder, prior DTs, anxiety and depression, chronic low back pain, unprovoked PE-completed Xarelto in 2019, HTN, tobacco use disorder presented to the ED on 08/27/2022 with complaints of shortness of breath for last few months associated with on and off lower extremity edema, dyspnea worse over the last few days PTA.  Not on any meds PTA.  Reportedly drinks 84 ounces of beer daily.  CTA chest confirmed no PE but showed dependent airspace disease in the lower lobes of the lungs bilaterally most suggestive of aspiration pneumonitis and developing aspiration pneumonia.  Admitted for aspiration pneumonia.  Course complicated by alcohol withdrawal delirium.  Patient was medically stable for DC to [facility] over the weekend.  However on 2/12, dizzy with symptomatic hypotension/orthostatic hypotension, ongoing issues with hypotension.    Assessment & Plan:  Principal Problem:   Aspiration pneumonia ([place]) Active Problems:   Tobacco use disorder   Alcohol use disorder, severe, dependence (HCC)   Acute on chronic diastolic CHF (congestive heart failure) (HCC)   Paroxysmal atrial fibrillation (HCC)   Back pain   Dyslipidemia   Hypomagnesemia   HTN (hypertension)   Macrocytic anemia   Poor social situation   Hiatal hernia   History of pulmonary embolus (PE)   Malnutrition of moderate degree   Aortic atherosclerosis (HCC)     Hypotension/orthostatic hypotension: Patient still remains orthostatic this morning.  Started on midodrine 5 mg 3 times daily by previous provider.  I will give him another normal saline bolus and continue aggressive IV fluids.    Aspiration pneumonia: -Suspect from alcohol disorder.  At this time patient remains on room air.  CTA chest negative for PE.  Does show some evidence of  bilateral pneumonitis.  Initially empirically started on Zosyn, now transition to p.o. Augmentin.  Complete 7-day course.  Severe alcohol use disorder with withdrawal and delirium: Patient denies any intention of quitting.  Continue thiamine, folic acid and multivitamin.  Acute metabolic encephalopathy: Secondary to alcohol withdrawal.  Resolved.   Multiple electrolyte abnormalities (hypokalemia, hypophosphatemia and hypomagnesemia): Replete as needed   Hyponatremia: May be due to beer potomania.  Stable.   Macrocytic anemia: Suspect due to anemia of chronic disease.  Stable.    Tobacco use disorder: Chews tobacco does not smoke.  Reportedly has not been using recently.   Paroxysmal atrial fibrillation: Medication noncompliance.  Due to his alcohol issue noncompliance he is not safe to start any kind of anticoagulation or antiarrhythmic therapy.  Currently in sinus rhythm.   Dyslipidemia Not on meds PTA   Essential hypertension Not on meds PTA.  Hypotensive now.   History of pulmonary embolism: No PE on CTA chest.  Continue Lovenox for DVT prophylaxis.   Hiatal hernia: Not on meds PTA.   Chronic back pain Judicious use of opioids.  Supportive care.  Patient states that he has L2-4 fracture and chronic back pain for which he takes Tylenol along with Robaxin and is requesting Robaxin to be added back on.   Homelessness TOC has been consulted.  As per discussion with TOC team, patient can go to [facility] (shelter) when stable.     As per d/w TOC> [facility] the day center.  It is open Monday to Friday from 8 AM-3 PM, close this from 3 PM to 8 PM and then again open from  8 AM to 8 AM the next day.  It is also closed on weekends.  She indicates that there are no other shelter options and he is likely already on the waiting list.  Patient has been provided with all resources for homeless shelters at discharge.   Body mass index is 19.14 kg/m.  DVT prophylaxis: SCDs Code Status: Full  code Family Communication:    Status is: Inpatient Remains inpatient appropriate because: Still orthostatic getting IV fluids      Subjective: Patient is orthostatic this morning requiring IV fluids.  Patient is clear to me that even when he is discharged at home he will not be leaving the hospital and wants me to call [facility] police department to take him to jail so he can have his own bed, toilet and meals 3 times a day.   Examination:  General exam: Appears calm and comfortable  Respiratory system: Clear to auscultation. Respiratory effort normal. Cardiovascular system: S1 & S2 heard, RRR. No JVD, murmurs, rubs, gallops or clicks. No pedal edema. Gastrointestinal system: Abdomen is nondistended, soft and nontender. No organomegaly or masses felt. Normal bowel sounds heard. Central nervous system: Alert and oriented. No focal neurological deficits. Extremities: Symmetric 5 x 5 power. Skin: No rashes, lesions or ulcers Psychiatry: Judgement and insight appear normal. Mood & affect appropriate.     Objective: Vitals:   09/01/22 1950 09/02/22 0300 09/02/22 0341 09/02/22 1357  BP: 98/62  102/73 103/73  Pulse: 94  69 (!) 109  Resp: 17  18 16  $ Temp: 98.7 F (37.1 C)  98.8 F (37.1 C) 98.2 F (36.8 C)  TempSrc: Oral  Oral   SpO2: 95%  98% 100%  Weight:  60.6 kg    Height:        Intake/Output Summary (Last 24 hours) at 09/02/2022 1429 Last data filed at 09/02/2022 0600 Gross per 24 hour  Intake 360 ml  Output --  Net 360 ml   Filed Weights   08/31/22 0500 09/01/22 0631 09/02/22 0300  Weight: 61.5 kg 60.5 kg 60.6 kg     Data Reviewed:   CBC: Recent Labs  Lab 08/27/22 0123 08/28/22 0339 08/29/22 0430 09/01/22 0417  WBC 11.2* 8.1 7.0 6.8  NEUTROABS 9.7*  --   --   --   HGB 10.7* 10.4* 10.1* 10.8*  HCT 32.4* 32.3* 30.6* 33.8*  MCV 108.4* 111.8* 109.3* 110.1*  PLT 231 175 154 [id number]   Basic Metabolic Panel: Recent Labs  Lab 08/28/22 0339 08/29/22 0430  08/30/22 0404 09/01/22 0417 09/01/22 1201 09/02/22 0421  NA 130* 133* 134* 131*  --  131*  K 3.4* 3.7 4.3 3.7  --  3.8  CL 100 98 101 98  --  100  CO2 23 25 24 23  $ --  21*  GLUCOSE 82 89 97 107*  --  96  BUN 11 7* 8 10  --  12  CREATININE 0.64 0.62 0.57* 0.65  --  0.81  CALCIUM 7.6* 7.9* 8.2* 8.5*  --  8.8*  MG 1.4* 1.7 1.8 1.3* 2.5* 1.9  PHOS 1.9* 3.4  --  3.8  --   --    GFR: Estimated Creatinine Clearance: 76.9 mL/min (by C-G formula based on SCr of 0.81 mg/dL). Liver Function Tests: Recent Labs  Lab 08/27/22 0123 08/28/22 0339 08/29/22 0430  AST 36 32 29  ALT 15 14 13  $ ALKPHOS 113 92 80  BILITOT 1.2 1.1 1.0  PROT 7.2 6.4* 6.4*  ALBUMIN 2.7*  2.4* 2.4*   No results for input(s): "LIPASE", "AMYLASE" in the last 168 hours. No results for input(s): "AMMONIA" in the last 168 hours. Coagulation Profile: No results for input(s): "INR", "PROTIME" in the last 168 hours. Cardiac Enzymes: No results for input(s): "CKTOTAL", "CKMB", "CKMBINDEX", "TROPONINI" in the last 168 hours. BNP (last 3 results) No results for input(s): "PROBNP" in the last 8760 hours. HbA1C: No results for input(s): "HGBA1C" in the last 72 hours. CBG: No results for input(s): "GLUCAP" in the last 168 hours. Lipid Profile: No results for input(s): "CHOL", "HDL", "LDLCALC", "TRIG", "CHOLHDL", "LDLDIRECT" in the last 72 hours. Thyroid Function Tests: Recent Labs    09/02/22 0421  TSH 1.712   Anemia Panel: No results for input(s): "VITAMINB12", "FOLATE", "FERRITIN", "TIBC", "IRON", "RETICCTPCT" in the last 72 hours. Sepsis Labs: Recent Labs  Lab 08/27/22 0600  LATICACIDVEN 0.9    Recent Results (from the past 240 hour(s))  Resp panel by RT-PCR (RSV, Flu A&B, Covid) Anterior Nasal Swab     Status: None   Collection Time: 08/27/22  1:41 AM   Specimen: Anterior Nasal Swab  Result Value Ref Range Status   SARS Coronavirus 2 by RT PCR NEGATIVE NEGATIVE Final    Comment: (NOTE) SARS-CoV-2 target  nucleic acids are NOT DETECTED.  The SARS-CoV-2 RNA is generally detectable in upper respiratory specimens during the acute phase of infection. The lowest concentration of SARS-CoV-2 viral copies this assay can detect is 138 copies/mL. A negative result does not preclude SARS-Cov-2 infection and should not be used as the sole basis for treatment or other patient management decisions. A negative result may occur with  improper specimen collection/handling, submission of specimen other than nasopharyngeal swab, presence of viral mutation(s) within the areas targeted by this assay, and inadequate number of viral copies(<138 copies/mL). A negative result must be combined with clinical observations, patient history, and epidemiological information. The expected result is Negative.  Fact Sheet for Patients:  EntrepreneurPulse.com.au  Fact Sheet for Healthcare Providers:  IncredibleEmployment.be  This test is no t yet approved or cleared by the [place] FDA and  has been authorized for detection and/or diagnosis of SARS-CoV-2 by FDA under an Emergency Use Authorization (EUA). This EUA will remain  in effect (meaning this test can be used) for the duration of the COVID-19 declaration under Section 564(b)(1) of the Act, 21 U.S.C.section 360bbb-3(b)(1), unless the authorization is terminated  or revoked sooner.       Influenza A by PCR NEGATIVE NEGATIVE Final   Influenza B by PCR NEGATIVE NEGATIVE Final    Comment: (NOTE) The Xpert Xpress SARS-CoV-2/FLU/RSV plus assay is intended as an aid in the diagnosis of influenza from Nasopharyngeal swab specimens and should not be used as a sole basis for treatment. Nasal washings and aspirates are unacceptable for Xpert Xpress SARS-CoV-2/FLU/RSV testing.  Fact Sheet for Patients: EntrepreneurPulse.com.au  Fact Sheet for Healthcare  Providers: IncredibleEmployment.be  This test is not yet approved or cleared by the [place] FDA and has been authorized for detection and/or diagnosis of SARS-CoV-2 by FDA under an Emergency Use Authorization (EUA). This EUA will remain in effect (meaning this test can be used) for the duration of the COVID-19 declaration under Section 564(b)(1) of the Act, 21 U.S.C. section 360bbb-3(b)(1), unless the authorization is terminated or revoked.     Resp Syncytial Virus by PCR NEGATIVE NEGATIVE Final    Comment: (NOTE) Fact Sheet for Patients: EntrepreneurPulse.com.au  Fact Sheet for Healthcare Providers: IncredibleEmployment.be  This test  is not yet approved or cleared by the [place] and has been authorized for detection and/or diagnosis of SARS-CoV-2 by FDA under an Emergency Use Authorization (EUA). This EUA will remain in effect (meaning this test can be used) for the duration of the COVID-19 declaration under Section 564(b)(1) of the Act, 21 U.S.C. section 360bbb-3(b)(1), unless the authorization is terminated or revoked.  Performed at [facility], [place] [address] [place], [place] [address]   Culture, blood (routine x 2)     Status: None   Collection Time: 08/27/22  5:42 AM   Specimen: BLOOD LEFT HAND  Result Value Ref Range Status   Specimen Description   Final    BLOOD LEFT HAND Performed at [facility] [address] [place], [place] [address]    Special Requests   Final    BOTTLES DRAWN AEROBIC AND ANAEROBIC Blood Culture adequate volume Performed at [facility] [address], [place], [place] [address]    Culture   Final    NO GROWTH 5 DAYS Performed at [facility], [place] [address], [place], [place] [address]    Report Status 09/01/2022 FINAL  Final  Culture, blood (routine x 2)     Status: None   Collection Time: 08/27/22   6:00 AM   Specimen: BLOOD  Result Value Ref Range Status   Specimen Description   Final    BLOOD SITE NOT SPECIFIED Performed at [facility] [address], [place], [place] [address]    Special Requests   Final    BOTTLES DRAWN AEROBIC AND ANAEROBIC Blood Culture results may not be optimal due to an excessive volume of blood received in culture bottles Performed at [facility] [address] [place], [place] [address]    Culture   Final    NO GROWTH 5 DAYS Performed at [facility], [place] [address], [place], [place] [address]    Report Status 09/01/2022 FINAL  Final  Expectorated Sputum Assessment w Gram Stain, Rflx to Resp Cult     Status: None   Collection Time: 08/27/22  2:01 PM   Specimen: Expectorated Sputum  Result Value Ref Range Status   Specimen Description EXPECTORATED SPUTUM  Final   Special Requests NONE  Final   Sputum evaluation   Final    THIS SPECIMEN IS ACCEPTABLE FOR SPUTUM CULTURE Performed at [facility], [place] [address] [place], [place] [address]    Report Status 08/27/2022 FINAL  Final  Culture, Respiratory w Gram Stain     Status: None   Collection Time: 08/27/22  2:01 PM  Result Value Ref Range Status   Specimen Description   Final    EXPECTORATED SPUTUM Performed at [facility] [address], [place], [place] [address]    Special Requests   Final    NONE Reflexed from [id number] Performed at [facility], [place] [address], [place], [place] [address]    Gram Stain   Final    FEW WBC PRESENT, PREDOMINANTLY PMN FEW GRAM POSITIVE COCCI    Culture   Final    Normal respiratory flora-no Staph aureus or Pseudomonas seen Performed at [facility] [address] [place], [place] [address]    Report Status 08/30/2022 FINAL  Final  Culture, blood (Routine X 2) w Reflex to ID Panel     Status: None (Preliminary result)   Collection Time:  08/29/22  10:10 PM   Specimen: BLOOD  Result Value Ref Range Status   Specimen Description   Final    BLOOD BLOOD RIGHT ARM Performed at [facility] [place] [address], [place], [place] [address]    Special Requests   Final    BOTTLES DRAWN AEROBIC AND ANAEROBIC Blood Culture adequate volume Performed at [facility] [address] [place], [place] [address]    Culture   Final    NO GROWTH 3 DAYS Performed at [facility], [place] [address] [place], [place] [address]    Report Status PENDING  Incomplete  Culture, blood (Routine X 2) w Reflex to ID Panel     Status: None (Preliminary result)   Collection Time: 08/29/22 10:10 PM   Specimen: BLOOD  Result Value Ref Range Status   Specimen Description   Final    BLOOD BLOOD LEFT ARM Performed at [facility] [address], [place], [place] [address]    Special Requests   Final    BOTTLES DRAWN AEROBIC AND ANAEROBIC Blood Culture adequate volume Performed at [facility] [address] [place],  [address]    Culture   Final    NO GROWTH 3 DAYS Performed at [facility], [place] [address] [place],  [address]    Report Status PENDING  Incomplete         Radiology Studies: No results found.      Scheduled Meds:  enoxaparin (LOVENOX) injection  40 mg Subcutaneous Daily   folic acid  1 mg Oral Daily   midodrine  5 mg Oral TID WC   multivitamin with minerals  1 tablet Oral Daily   pantoprazole  40 mg Oral QHS   thiamine  100 mg Oral Daily   Continuous Infusions:   LOS: 6 days   Time spent= 35 mins    Amora Sheehy Arsenio Loader, MD [facility]  If 7PM-7AM, please contact night-coverage  09/02/2022, 2:29 PM

## 2022-09-03 DIAGNOSIS — J690 Pneumonitis due to inhalation of food and vomit: Secondary | ICD-10-CM | POA: Diagnosis not present

## 2022-09-03 LAB — BASIC METABOLIC PANEL WITH GFR
Anion gap: 10 (ref 5–15)
BUN: 10 mg/dL (ref 8–23)
CO2: 17 mmol/L — ABNORMAL LOW (ref 22–32)
Calcium: 8.3 mg/dL — ABNORMAL LOW (ref 8.9–10.3)
Chloride: 103 mmol/L (ref 98–111)
Creatinine, Ser: 0.66 mg/dL (ref 0.61–1.24)
GFR, Estimated: >60 mL/min
Glucose, Bld: 86 mg/dL (ref 70–99)
Potassium: 3.3 mmol/L — ABNORMAL LOW (ref 3.5–5.1)
Sodium: 130 mmol/L — ABNORMAL LOW (ref 135–145)

## 2022-09-03 LAB — CBC
HCT: 30.6 % — ABNORMAL LOW (ref 39.0–52.0)
Hemoglobin: 9.7 g/dL — ABNORMAL LOW (ref 13.0–17.0)
MCH: 35.5 pg — ABNORMAL HIGH (ref 26.0–34.0)
MCHC: 31.7 g/dL (ref 30.0–36.0)
MCV: 112.1 fL — ABNORMAL HIGH (ref 80.0–100.0)
Platelets: 320 10*3/uL (ref 150–400)
RBC: 2.73 MIL/uL — ABNORMAL LOW (ref 4.22–5.81)
RDW: 16.1 % — ABNORMAL HIGH (ref 11.5–15.5)
WBC: 9 10*3/uL (ref 4.0–10.5)
nRBC: 0 % (ref 0.0–0.2)

## 2022-09-03 LAB — MAGNESIUM: Magnesium: 1.5 mg/dL — ABNORMAL LOW (ref 1.7–2.4)

## 2022-09-03 MED ORDER — POTASSIUM CHLORIDE 10 MEQ/100ML IV SOLN
10.0000 meq | INTRAVENOUS | Status: AC
  Administered 2022-09-03 (×4): 10 meq via INTRAVENOUS
  Filled 2022-09-03 (×3): qty 100

## 2022-09-03 MED ORDER — SODIUM CHLORIDE 0.9 % IV SOLN: INTRAVENOUS | Status: AC

## 2022-09-03 MED ORDER — SODIUM CHLORIDE 0.9 % IV BOLUS
500.0000 mL | Freq: Once | INTRAVENOUS | Status: AC
  Administered 2022-09-03: 500 mL via INTRAVENOUS

## 2022-09-03 MED ORDER — MAGNESIUM SULFATE 4 GM/100ML IV SOLN
4.0000 g | Freq: Once | INTRAVENOUS | Status: AC
  Administered 2022-09-03: 4 g via INTRAVENOUS
  Filled 2022-09-03: qty 100

## 2022-09-03 MED ORDER — POTASSIUM CHLORIDE CRYS ER 20 MEQ PO TBCR
40.0000 meq | EXTENDED_RELEASE_TABLET | Freq: Once | ORAL | Status: AC
  Administered 2022-09-03: 40 meq via ORAL
  Filled 2022-09-03: qty 2

## 2022-09-03 NOTE — Progress Notes (Signed)
PROGRESS NOTE    Anthony [name]  PT:[id number] DOB: 09-Jun-1956 DOA: 08/27/2022 PCP: Jacelyn Grip, MD   Brief Narrative:  67 year old homeless male with medical history significant for alcohol use disorder, prior DTs, anxiety and depression, chronic low back pain, unprovoked PE-completed Xarelto in 2019, HTN, tobacco use disorder presented to the ED on 08/27/2022 with complaints of shortness of breath for last few months associated with on and off lower extremity edema, dyspnea worse over the last few days PTA.  Not on any meds PTA.  Reportedly drinks 84 ounces of beer daily.  CTA chest confirmed no PE but showed dependent airspace disease in the lower lobes of the lungs bilaterally most suggestive of aspiration pneumonitis and developing aspiration pneumonia.  Admitted for aspiration pneumonia.  Course complicated by alcohol withdrawal delirium.  Patient was medically stable for DC to [facility] over the weekend.  However on 2/12, dizzy with symptomatic hypotension/orthostatic hypotension, ongoing issues with hypotension.    Assessment & Plan:  Principal Problem:   Aspiration pneumonia ([place]) Active Problems:   Tobacco use disorder   Alcohol use disorder, severe, dependence (HCC)   Acute on chronic diastolic CHF (congestive heart failure) (HCC)   Paroxysmal atrial fibrillation (HCC)   Back pain   Dyslipidemia   Hypomagnesemia   HTN (hypertension)   Macrocytic anemia   Poor social situation   Hiatal hernia   History of pulmonary embolus (PE)   Malnutrition of moderate degree   Aortic atherosclerosis (HCC)     Hypotension/orthostatic hypotension: Still remains orthostatic this morning but improved.  Continue midodrine 10 mg 3 times daily.  Continue IV fluids.     Aspiration pneumonia: -Suspect from alcohol disorder.  At this time patient remains on room air.  CTA chest negative for PE.  Does show some evidence of bilateral pneumonitis.  Initially empirically started on Zosyn, now transition to  p.o. Augmentin.  Complete 7-day course.  Severe alcohol use disorder with withdrawal and delirium: Patient denies any intention of quitting.  Continue thiamine, folic acid and multivitamin.   Acute metabolic encephalopathy: Secondary to alcohol withdrawal.  Resolved.   Multiple electrolyte abnormalities (hypokalemia, hypophosphatemia and hypomagnesemia): Replete as needed   Hyponatremia: May be due to beer potomania.  Stable.   Macrocytic anemia: Suspect due to anemia of chronic disease.  Stable.    Tobacco use disorder: Chews tobacco does not smoke.  Reportedly has not been using recently.   Paroxysmal atrial fibrillation: Medication noncompliance.  Due to his alcohol issue noncompliance he is not safe to start any kind of anticoagulation or antiarrhythmic therapy.  Currently in sinus rhythm.   Dyslipidemia Not on meds PTA   Essential hypertension Not on meds PTA.  Hypotensive now.   History of pulmonary embolism: No PE on CTA chest.  Continue Lovenox for DVT prophylaxis.   Hiatal hernia: Not on meds PTA.   Chronic back pain Judicious use of opioids.  Supportive care.  Patient states that he has L2-4 fracture and chronic back pain for which he takes Tylenol along with Robaxin and is requesting Robaxin to be added back on.   Homelessness TOC has been consulted.  As per discussion with TOC team, patient can go to [facility] (shelter) when stable.     As per d/w TOC> [facility] the day center.  It is open Monday to Friday from 8 AM-3 PM, close this from 3 PM to 8 PM and then again open from 8 AM to 8 AM the next day.  It is  also closed on weekends.  She indicates that there are no other shelter options and he is likely already on the waiting list.  Patient has been provided with all resources for homeless shelters at discharge.   Body mass index is 19.14 kg/m.  DVT prophylaxis: SCDs Code Status: Full code Family Communication:    Status is: Inpatient Remains inpatient appropriate  because: Still orthostatic getting IV fluids      Subjective: Seen and examined at bedside, still feeling dizzy especially with standing up, orthostatic.  Examination:  Constitutional: Not in acute distress Respiratory: Clear to auscultation bilaterally Cardiovascular: Normal sinus rhythm, no rubs Abdomen: Nontender nondistended good bowel sounds Musculoskeletal: No edema noted Skin: No rashes seen Neurologic: CN 2-12 grossly intact.  And nonfocal Psychiatric: Normal judgment and insight. Alert and oriented x 3. Normal mood.  Objective: Vitals:   09/03/22 0516 09/03/22 0517 09/03/22 0831 09/03/22 1215  BP: 101/75 101/75 129/82 109/69  Pulse: (!) 111 (!) 111 (!) 109 (!) 101  Resp: 17 17 17 17  $ Temp: 98 F (36.7 C) 98 F (36.7 C) 98.5 F (36.9 C) 98.1 F (36.7 C)  TempSrc: Oral  Oral Oral  SpO2:    95%  Weight:   61.6 kg   Height:        Intake/Output Summary (Last 24 hours) at 09/03/2022 1339 Last data filed at 09/03/2022 1300 Gross per 24 hour  Intake 1692.88 ml  Output 350 ml  Net 1342.88 ml   Filed Weights   09/01/22 0631 09/02/22 0300 09/03/22 0831  Weight: 60.5 kg 60.6 kg 61.6 kg     Data Reviewed:   CBC: Recent Labs  Lab 08/28/22 0339 08/29/22 0430 09/01/22 0417 09/03/22 0359  WBC 8.1 7.0 6.8 9.0  HGB 10.4* 10.1* 10.8* 9.7*  HCT 32.3* 30.6* 33.8* 30.6*  MCV 111.8* 109.3* 110.1* 112.1*  PLT 175 154 202 [id number]   Basic Metabolic Panel: Recent Labs  Lab 08/28/22 0339 08/29/22 0430 08/30/22 0404 09/01/22 0417 09/01/22 1201 09/02/22 0421 09/03/22 0359  NA 130* 133* 134* 131*  --  131* 130*  K 3.4* 3.7 4.3 3.7  --  3.8 3.3*  CL 100 98 101 98  --  100 103  CO2 23 25 24 23  $ --  21* 17*  GLUCOSE 82 89 97 107*  --  96 86  BUN 11 7* 8 10  --  12 10  CREATININE 0.64 0.62 0.57* 0.65  --  0.81 0.66  CALCIUM 7.6* 7.9* 8.2* 8.5*  --  8.8* 8.3*  MG 1.4* 1.7 1.8 1.3* 2.5* 1.9 1.5*  PHOS 1.9* 3.4  --  3.8  --   --   --    GFR: Estimated Creatinine  Clearance: 79.1 mL/min (by C-G formula based on SCr of 0.66 mg/dL). Liver Function Tests: Recent Labs  Lab 08/28/22 0339 08/29/22 0430  AST 32 29  ALT 14 13  ALKPHOS 92 80  BILITOT 1.1 1.0  PROT 6.4* 6.4*  ALBUMIN 2.4* 2.4*   No results for input(s): "LIPASE", "AMYLASE" in the last 168 hours. No results for input(s): "AMMONIA" in the last 168 hours. Coagulation Profile: No results for input(s): "INR", "PROTIME" in the last 168 hours. Cardiac Enzymes: No results for input(s): "CKTOTAL", "CKMB", "CKMBINDEX", "TROPONINI" in the last 168 hours. BNP (last 3 results) No results for input(s): "PROBNP" in the last 8760 hours. HbA1C: No results for input(s): "HGBA1C" in the last 72 hours. CBG: No results for input(s): "GLUCAP" in the last 168  hours. Lipid Profile: No results for input(s): "CHOL", "HDL", "LDLCALC", "TRIG", "CHOLHDL", "LDLDIRECT" in the last 72 hours. Thyroid Function Tests: Recent Labs    09/02/22 0421  TSH 1.712   Anemia Panel: No results for input(s): "VITAMINB12", "FOLATE", "FERRITIN", "TIBC", "IRON", "RETICCTPCT" in the last 72 hours. Sepsis Labs: No results for input(s): "PROCALCITON", "LATICACIDVEN" in the last 168 hours.   Recent Results (from the past 240 hour(s))  Resp panel by RT-PCR (RSV, Flu A&B, Covid) Anterior Nasal Swab     Status: None   Collection Time: 08/27/22  1:41 AM   Specimen: Anterior Nasal Swab  Result Value Ref Range Status   SARS Coronavirus 2 by RT PCR NEGATIVE NEGATIVE Final    Comment: (NOTE) SARS-CoV-2 target nucleic acids are NOT DETECTED.  The SARS-CoV-2 RNA is generally detectable in upper respiratory specimens during the acute phase of infection. The lowest concentration of SARS-CoV-2 viral copies this assay can detect is 138 copies/mL. A negative result does not preclude SARS-Cov-2 infection and should not be used as the sole basis for treatment or other patient management decisions. A negative result may occur with   improper specimen collection/handling, submission of specimen other than nasopharyngeal swab, presence of viral mutation(s) within the areas targeted by this assay, and inadequate number of viral copies(<138 copies/mL). A negative result must be combined with clinical observations, patient history, and epidemiological information. The expected result is Negative.  Fact Sheet for Patients:  EntrepreneurPulse.com.au  Fact Sheet for Healthcare Providers:  IncredibleEmployment.be  This test is no t yet approved or cleared by the [place] FDA and  has been authorized for detection and/or diagnosis of SARS-CoV-2 by FDA under an Emergency Use Authorization (EUA). This EUA will remain  in effect (meaning this test can be used) for the duration of the COVID-19 declaration under Section 564(b)(1) of the Act, 21 U.S.C.section 360bbb-3(b)(1), unless the authorization is terminated  or revoked sooner.       Influenza A by PCR NEGATIVE NEGATIVE Final   Influenza B by PCR NEGATIVE NEGATIVE Final    Comment: (NOTE) The Xpert Xpress SARS-CoV-2/FLU/RSV plus assay is intended as an aid in the diagnosis of influenza from Nasopharyngeal swab specimens and should not be used as a sole basis for treatment. Nasal washings and aspirates are unacceptable for Xpert Xpress SARS-CoV-2/FLU/RSV testing.  Fact Sheet for Patients: EntrepreneurPulse.com.au  Fact Sheet for Healthcare Providers: IncredibleEmployment.be  This test is not yet approved or cleared by the [place] FDA and has been authorized for detection and/or diagnosis of SARS-CoV-2 by FDA under an Emergency Use Authorization (EUA). This EUA will remain in effect (meaning this test can be used) for the duration of the COVID-19 declaration under Section 564(b)(1) of the Act, 21 U.S.C. section 360bbb-3(b)(1), unless the authorization is terminated or revoked.      Resp Syncytial Virus by PCR NEGATIVE NEGATIVE Final    Comment: (NOTE) Fact Sheet for Patients: EntrepreneurPulse.com.au  Fact Sheet for Healthcare Providers: IncredibleEmployment.be  This test is not yet approved or cleared by the [place] FDA and has been authorized for detection and/or diagnosis of SARS-CoV-2 by FDA under an Emergency Use Authorization (EUA). This EUA will remain in effect (meaning this test can be used) for the duration of the COVID-19 declaration under Section 564(b)(1) of the Act, 21 U.S.C. section 360bbb-3(b)(1), unless the authorization is terminated or revoked.  Performed at [facility], [place] [address], [place], [place] [address]   Culture, blood (routine x 2)  Status: None   Collection Time: 08/27/22  5:42 AM   Specimen: BLOOD LEFT HAND  Result Value Ref Range Status   Specimen Description   Final    BLOOD LEFT HAND Performed at [place] [address] [place], [place] [address]    Special Requests   Final    BOTTLES DRAWN AEROBIC AND ANAEROBIC Blood Culture adequate volume Performed at [place] [address] [place], [place] [address]    Culture   Final    NO GROWTH 5 DAYS Performed at [facility], [place] [address] [place], [place] [address]    Report Status 09/01/2022 FINAL  Final  Culture, blood (routine x 2)     Status: None   Collection Time: 08/27/22  6:00 AM   Specimen: BLOOD  Result Value Ref Range Status   Specimen Description   Final    BLOOD SITE NOT SPECIFIED Performed at [facility] [place] [address] [place], [place] [address]    Special Requests   Final    BOTTLES DRAWN AEROBIC AND ANAEROBIC Blood Culture results may not be optimal due to an excessive volume of blood received in culture bottles Performed at [place] [address] [place], [place] [address]     Culture   Final    NO GROWTH 5 DAYS Performed at [facility], [place] [address], [place], [place] [address]    Report Status 09/01/2022 FINAL  Final  Expectorated Sputum Assessment w Gram Stain, Rflx to Resp Cult     Status: None   Collection Time: 08/27/22  2:01 PM   Specimen: Expectorated Sputum  Result Value Ref Range Status   Specimen Description EXPECTORATED SPUTUM  Final   Special Requests NONE  Final   Sputum evaluation   Final    THIS SPECIMEN IS ACCEPTABLE FOR SPUTUM CULTURE Performed at [facility], [place] [address], [place], [place] [address]    Report Status 08/27/2022 FINAL  Final  Culture, Respiratory w Gram Stain     Status: None   Collection Time: 08/27/22  2:01 PM  Result Value Ref Range Status   Specimen Description   Final    EXPECTORATED SPUTUM Performed at [place] [address], [place], [place] [address]    Special Requests   Final    NONE Reflexed from [phone] [phone] Performed at [facility], [place] [address] [place], [place] [address]    Gram Stain   Final    FEW WBC PRESENT, PREDOMINANTLY PMN FEW GRAM POSITIVE COCCI    Culture   Final    Normal respiratory flora-no Staph aureus or Pseudomonas seen Performed at [place] [address], [place], [place] [address]    Report Status 08/30/2022 FINAL  Final  Culture, blood (Routine X 2) w Reflex to ID Panel     Status: None (Preliminary result)   Collection Time: 08/29/22 10:10 PM   Specimen: BLOOD  Result Value Ref Range Status   Specimen Description   Final    BLOOD BLOOD RIGHT ARM Performed at [facility] [address] [place], [place] [address]    Special Requests   Final    BOTTLES DRAWN AEROBIC AND ANAEROBIC Blood Culture adequate volume Performed at [place] [address] [place], [place] [address]    Culture   Final    NO GROWTH 4 DAYS Performed at [facility],  [place] [address] [place], [place] [address]    Report Status PENDING  Incomplete  Culture, blood (Routine X 2) w Reflex to ID Panel     Status: None (Preliminary result)   Collection Time: 08/29/22 10:10 PM   Specimen: BLOOD  Result Value Ref Range Status   Specimen Description   Final    BLOOD BLOOD LEFT ARM Performed at [place] [address], [place], [place] [address]    Special Requests   Final    BOTTLES DRAWN AEROBIC AND ANAEROBIC Blood Culture adequate volume Performed at [facility] [address], [place], [place] [address]    Culture   Final    NO GROWTH 4 DAYS Performed at [facility], [place] [address] [place], [place] [address]    Report Status PENDING  Incomplete         Radiology Studies: No results found.      Scheduled Meds:  enoxaparin (LOVENOX) injection  40 mg Subcutaneous Daily   folic acid  1 mg Oral Daily   midodrine  10 mg Oral TID WC   multivitamin with minerals  1 tablet Oral Daily   pantoprazole  40 mg Oral QHS   thiamine  100 mg Oral Daily   Continuous Infusions:  sodium chloride 125 mL/hr at 09/03/22 0913   potassium chloride 10 mEq (09/03/22 1302)     LOS: 7 days   Time spent= 35 mins    Gerarda Conklin Arsenio Loader, MD [facility]  If 7PM-7AM, please contact night-coverage  09/03/2022, 1:39 PM

## 2022-09-03 NOTE — Progress Notes (Signed)
Mobility Specialist - Progress Note   09/03/22 1001  Mobility  Activity Ambulated with assistance to bathroom  Level of Assistance Standby assist, set-up cues, supervision of patient - no hands on  Assistive Device Front wheel walker  Distance Ambulated (ft) 20 ft  Range of Motion/Exercises Active  Activity Response Tolerated well  Mobility Referral Yes  $Mobility charge 1 Mobility   Pt was found in bed wanting to ambulate to bathroom. Was SB for ambulation to bathroom and declined to sit on recliner chair or further ambulation. At EOS returned to bed with all necessities in reach.  Ferd Hibbs Mobility Specialist

## 2022-09-03 NOTE — Care Management Important Message (Signed)
Important Message  Patient Details IM Letter given. Name: Anthony [name] MRN: [id number] Date of Birth: 04-30-1956   Medicare Important Message Given:  Yes     Kerin Salen 09/03/2022, 12:43 PM

## 2022-09-04 DIAGNOSIS — J690 Pneumonitis due to inhalation of food and vomit: Secondary | ICD-10-CM | POA: Diagnosis not present

## 2022-09-04 LAB — BASIC METABOLIC PANEL
Anion gap: 6 (ref 5–15)
BUN: 8 mg/dL (ref 8–23)
CO2: 20 mmol/L — ABNORMAL LOW (ref 22–32)
Calcium: 8.1 mg/dL — ABNORMAL LOW (ref 8.9–10.3)
Chloride: 107 mmol/L (ref 98–111)
Creatinine, Ser: 0.55 mg/dL — ABNORMAL LOW (ref 0.61–1.24)
GFR, Estimated: >60 mL/min (ref >60)
Glucose, Bld: 83 mg/dL (ref 70–99)
Potassium: 3.5 mmol/L (ref 3.5–5.1)
Sodium: 133 mmol/L — ABNORMAL LOW (ref 135–145)

## 2022-09-04 LAB — CULTURE, BLOOD (ROUTINE X 2)
Culture: NO GROWTH
Culture: NO GROWTH
Special Requests: ADEQUATE
Special Requests: ADEQUATE

## 2022-09-04 LAB — MAGNESIUM: Magnesium: 1.8 mg/dL (ref 1.7–2.4)

## 2022-09-04 LAB — CBC
HCT: 26 % — ABNORMAL LOW (ref 39.0–52.0)
Hemoglobin: 8.3 g/dL — ABNORMAL LOW (ref 13.0–17.0)
MCH: 35.9 pg — ABNORMAL HIGH (ref 26.0–34.0)
MCHC: 31.9 g/dL (ref 30.0–36.0)
MCV: 112.6 fL — ABNORMAL HIGH (ref 80.0–100.0)
Platelets: 315 10*3/uL (ref 150–400)
RBC: 2.31 MIL/uL — ABNORMAL LOW (ref 4.22–5.81)
RDW: 15.7 % — ABNORMAL HIGH (ref 11.5–15.5)
WBC: 6.3 10*3/uL (ref 4.0–10.5)
nRBC: 0 % (ref 0.0–0.2)

## 2022-09-04 MED ORDER — LOPERAMIDE HCL 2 MG PO CAPS
4.0000 mg | ORAL_CAPSULE | ORAL | Status: DC | PRN
  Administered 2022-09-04: 4 mg via ORAL
  Filled 2022-09-04: qty 2

## 2022-09-04 MED ORDER — MAGNESIUM SULFATE IN D5W 1-5 GM/100ML-% IV SOLN
1.0000 g | Freq: Once | INTRAVENOUS | Status: AC
  Administered 2022-09-04: 1 g via INTRAVENOUS
  Filled 2022-09-04: qty 100

## 2022-09-04 MED ORDER — POTASSIUM CHLORIDE 10 MEQ/100ML IV SOLN
10.0000 meq | INTRAVENOUS | Status: AC
  Administered 2022-09-04 (×3): 10 meq via INTRAVENOUS
  Filled 2022-09-04 (×3): qty 100

## 2022-09-04 NOTE — Progress Notes (Signed)
PROGRESS NOTE    Anthony [name]  CE:[id number] DOB: 1956-07-05 DOA: 08/27/2022 PCP: Jacelyn Grip, MD   Brief Narrative:  67 year old homeless male with medical history significant for alcohol use disorder, prior DTs, anxiety and depression, chronic low back pain, unprovoked PE-completed Xarelto in 2019, HTN, tobacco use disorder presented to the ED on 08/27/2022 with complaints of shortness of breath for last few months associated with on and off lower extremity edema, dyspnea worse over the last few days PTA.  Not on any meds PTA.  Reportedly drinks 84 ounces of beer daily.  CTA chest confirmed no PE but showed dependent airspace disease in the lower lobes of the lungs bilaterally most suggestive of aspiration pneumonitis and developing aspiration pneumonia.  Admitted for aspiration pneumonia.  Course complicated by alcohol withdrawal delirium.  Patient was medically stable for DC to [facility] over the weekend.  However on 2/12, dizzy with symptomatic hypotension/orthostatic hypotension, ongoing issues with hypotension.    Assessment & Plan:  Principal Problem:   Aspiration pneumonia ([place]) Active Problems:   Tobacco use disorder   Alcohol use disorder, severe, dependence (HCC)   Acute on chronic diastolic CHF (congestive heart failure) (HCC)   Paroxysmal atrial fibrillation (HCC)   Back pain   Dyslipidemia   Hypomagnesemia   HTN (hypertension)   Macrocytic anemia   Poor social situation   Hiatal hernia   History of pulmonary embolus (PE)   Malnutrition of moderate degree   Aortic atherosclerosis (HCC)     Hypotension/orthostatic hypotension: Patient is still orthostatic.  Continue midodrine 10 mg 3 times daily.  Continue IV fluids.     Aspiration pneumonia: -Suspect from alcohol disorder.  At this time patient remains on room air.  CTA chest negative for PE.  Does show some evidence of bilateral pneumonitis.  Initially empirically started on Zosyn, now transition to p.o. Augmentin.   Complete 7-day course.  Severe alcohol use disorder with withdrawal and delirium: Patient denies any intention of quitting.  Continue thiamine, folic acid and multivitamin.   Acute metabolic encephalopathy: Secondary to alcohol withdrawal.  Resolved.   Multiple electrolyte abnormalities (hypokalemia, hypophosphatemia and hypomagnesemia): Replete as needed   Hyponatremia: May be due to beer potomania.  Stable.   Macrocytic anemia: Suspect due to anemia of chronic disease.  Stable.    Tobacco use disorder: Chews tobacco does not smoke.  Reportedly has not been using recently.   Paroxysmal atrial fibrillation: Medication noncompliance.  Due to his alcohol issue noncompliance he is not safe to start any kind of anticoagulation or antiarrhythmic therapy.  Currently in sinus rhythm.   Dyslipidemia Not on meds PTA   Essential hypertension Not on meds PTA.  Hypotensive now.   History of pulmonary embolism: No PE on CTA chest.  Continue Lovenox for DVT prophylaxis.   Hiatal hernia: Not on meds PTA.   Chronic back pain Judicious use of opioids.  Supportive care.  Patient states that he has L2-4 fracture and chronic back pain for which he takes Tylenol along with Robaxin and is requesting Robaxin to be added back on.   Homelessness TOC has been consulted.  As per discussion with TOC team, patient can go to [facility] (shel[facility] when stable.     As per d/w TOC> [facility] the day center.  It is open Monday to Friday from 8 AM-3 PM, close this from 3 PM to 8 PM and then again open from 8 AM to 8 AM the next day.  It is also closed on  weekends.  She indicates that there are no other shelter options and he is likely already on the waiting list.  Patient has been provided with all resources for homeless shelters at discharge.   Body mass index is 19.14 kg/m.  DVT prophylaxis: SCDs Code Status: Full code Family Communication:    Status is: Inpatient Remains inpatient appropriate because: Still  orthostatic getting IV fluids      Subjective: Had some diarrhea overnight.  No complaints this morning Examination: Constitutional: Not in acute distress Respiratory: Clear to auscultation bilaterally Cardiovascular: Normal sinus rhythm, no rubs Abdomen: Nontender nondistended good bowel sounds Musculoskeletal: No edema noted Skin: No rashes seen Neurologic: CN 2-12 grossly intact.  And nonfocal Psychiatric: Normal judgment and insight. Alert and oriented x 3. Normal mood.  Objective: Vitals:   09/04/22 0532 09/04/22 0835 09/04/22 0945 09/04/22 0950  BP:  113/85 127/79   Pulse:  95 (!) 114 (!) 136  Resp:  15 15 18  $ Temp:  98.4 F (36.9 C)    TempSrc:  Oral    SpO2:  100% 98%   Weight: 63.5 kg     Height:        Intake/Output Summary (Last 24 hours) at 09/04/2022 1046 Last data filed at 09/04/2022 0900 Gross per 24 hour  Intake 1151.29 ml  Output 1100 ml  Net 51.29 ml   Filed Weights   09/02/22 0300 09/03/22 0831 09/04/22 0532  Weight: 60.6 kg 61.6 kg 63.5 kg     Data Reviewed:   CBC: Recent Labs  Lab 08/29/22 0430 09/01/22 0417 09/03/22 0359 09/04/22 0432  WBC 7.0 6.8 9.0 6.3  HGB 10.1* 10.8* 9.7* 8.3*  HCT 30.6* 33.8* 30.6* 26.0*  MCV 109.3* 110.1* 112.1* 112.6*  PLT 154 202 320 [id number]   Basic Metabolic Panel: Recent Labs  Lab 08/29/22 0430 08/30/22 0404 09/01/22 0417 09/01/22 1201 09/02/22 0421 09/03/22 0359 09/04/22 0432  NA 133* 134* 131*  --  131* 130* 133*  K 3.7 4.3 3.7  --  3.8 3.3* 3.5  CL 98 101 98  --  100 103 107  CO2 25 24 23  $ --  21* 17* 20*  GLUCOSE 89 97 107*  --  96 86 83  BUN 7* 8 10  --  12 10 8  $ CREATININE 0.62 0.57* 0.65  --  0.81 0.66 0.55*  CALCIUM 7.9* 8.2* 8.5*  --  8.8* 8.3* 8.1*  MG 1.7 1.8 1.3* 2.5* 1.9 1.5* 1.8  PHOS 3.4  --  3.8  --   --   --   --    GFR: Estimated Creatinine Clearance: 81.6 mL/min (A) (by C-G formula based on SCr of 0.55 mg/dL (L)). Liver Function Tests: Recent Labs  Lab 08/29/22 0430   AST 29  ALT 13  ALKPHOS 80  BILITOT 1.0  PROT 6.4*  ALBUMIN 2.4*   No results for input(s): "LIPASE", "AMYLASE" in the last 168 hours. No results for input(s): "AMMONIA" in the last 168 hours. Coagulation Profile: No results for input(s): "INR", "PROTIME" in the last 168 hours. Cardiac Enzymes: No results for input(s): "CKTOTAL", "CKMB", "CKMBINDEX", "TROPONINI" in the last 168 hours. BNP (last 3 results) No results for input(s): "PROBNP" in the last 8760 hours. HbA1C: No results for input(s): "HGBA1C" in the last 72 hours. CBG: No results for input(s): "GLUCAP" in the last 168 hours. Lipid Profile: No results for input(s): "CHOL", "HDL", "LDLCALC", "TRIG", "CHOLHDL", "LDLDIRECT" in the last 72 hours. Thyroid Function Tests: Recent Labs  09/02/22 0421  TSH 1.712   Anemia Panel: No results for input(s): "VITAMINB12", "FOLATE", "FERRITIN", "TIBC", "IRON", "RETICCTPCT" in the last 72 hours. Sepsis Labs: No results for input(s): "PROCALCITON", "LATICACIDVEN" in the last 168 hours.   Recent Results (from the past 240 hour(s))  Resp panel by RT-PCR (RSV, Flu A&B, Covid) Anterior Nasal Swab     Status: None   Collection Time: 08/27/22  1:41 AM   Specimen: Anterior Nasal Swab  Result Value Ref Range Status   SARS Coronavirus 2 by RT PCR NEGATIVE NEGATIVE Final    Comment: (NOTE) SARS-CoV-2 target nucleic acids are NOT DETECTED.  The SARS-CoV-2 RNA is generally detectable in upper respiratory specimens during the acute phase of infection. The lowest concentration of SARS-CoV-2 viral copies this assay can detect is 138 copies/mL. A negative result does not preclude SARS-Cov-2 infection and should not be used as the sole basis for treatment or other patient management decisions. A negative result may occur with  improper specimen collection/handling, submission of specimen other than nasopharyngeal swab, presence of viral mutation(s) within the areas targeted by this assay,  and inadequate number of viral copies(<138 copies/mL). A negative result must be combined with clinical observations, patient history, and epidemiological information. The expected result is Negative.  Fact Sheet for Patients:  EntrepreneurPulse.com.au  Fact Sheet for Healthcare Providers:  IncredibleEmployment.be  This test is no t yet approved or cleared by the [place] FDA and  has been authorized for detection and/or diagnosis of SARS-CoV-2 by FDA under an Emergency Use Authorization (EUA). This EUA will remain  in effect (meaning this test can be used) for the duration of the COVID-19 declaration under Section 564(b)(1) of the Act, 21 U.S.C.section 360bbb-3(b)(1), unless the authorization is terminated  or revoked sooner.       Influenza A by PCR NEGATIVE NEGATIVE Final   Influenza B by PCR NEGATIVE NEGATIVE Final    Comment: (NOTE) The Xpert Xpress SARS-CoV-2/FLU/RSV plus assay is intended as an aid in the diagnosis of influenza from Nasopharyngeal swab specimens and should not be used as a sole basis for treatment. Nasal washings and aspirates are unacceptable for Xpert Xpress SARS-CoV-2/FLU/RSV testing.  Fact Sheet for Patients: EntrepreneurPulse.com.au  Fact Sheet for Healthcare Providers: IncredibleEmployment.be  This test is not yet approved or cleared by the [place] FDA and has been authorized for detection and/or diagnosis of SARS-CoV-2 by FDA under an Emergency Use Authorization (EUA). This EUA will remain in effect (meaning this test can be used) for the duration of the COVID-19 declaration under Section 564(b)(1) of the Act, 21 U.S.C. section 360bbb-3(b)(1), unless the authorization is terminated or revoked.     Resp Syncytial Virus by PCR NEGATIVE NEGATIVE Final    Comment: (NOTE) Fact Sheet for Patients: EntrepreneurPulse.com.au  Fact Sheet for  Healthcare Providers: IncredibleEmployment.be  This test is not yet approved or cleared by the [place] FDA and has been authorized for detection and/or diagnosis of SARS-CoV-2 by FDA under an Emergency Use Authorization (EUA). This EUA will remain in effect (meaning this test can be used) for the duration of the COVID-19 declaration under Section 564(b)(1) of the Act, 21 U.S.C. section 360bbb-3(b)(1), unless the authorization is terminated or revoked.  Performed at [facility], [place] [address], [place], [place] [address]   Culture, blood (routine x 2)     Status: None   Collection Time: 08/27/22  5:42 AM   Specimen: BLOOD LEFT HAND  Result Value Ref Range Status  Specimen Description   Final    BLOOD LEFT HAND Performed at [facility] [address], [place], [place] [address]    Special Requests   Final    BOTTLES DRAWN AEROBIC AND ANAEROBIC Blood Culture adequate volume Performed at [place] [address], [place], [place] [address]    Culture   Final    NO GROWTH 5 DAYS Performed at [facility], [place] [address] [place], [place] [address]    Report Status 09/01/2022 FINAL  Final  Culture, blood (routine x 2)     Status: None   Collection Time: 08/27/22  6:00 AM   Specimen: BLOOD  Result Value Ref Range Status   Specimen Description   Final    BLOOD SITE NOT SPECIFIED Performed at [facility] [address], [place], [place] [address]    Special Requests   Final    BOTTLES DRAWN AEROBIC AND ANAEROBIC Blood Culture results may not be optimal due to an excessive volume of blood received in culture bottles Performed at [facility] [address] [place], [place] [address]    Culture   Final    NO GROWTH 5 DAYS Performed at [facility], [place] [address] [place], [place] [address]    Report Status 09/01/2022 FINAL  Final   Expectorated Sputum Assessment w Gram Stain, Rflx to Resp Cult     Status: None   Collection Time: 08/27/22  2:01 PM   Specimen: Expectorated Sputum  Result Value Ref Range Status   Specimen Description EXPECTORATED SPUTUM  Final   Special Requests NONE  Final   Sputum evaluation   Final    THIS SPECIMEN IS ACCEPTABLE FOR SPUTUM CULTURE Performed at [facility], [place] [address], [place], [place] [address]    Report Status 08/27/2022 FINAL  Final  Culture, Respiratory w Gram Stain     Status: None   Collection Time: 08/27/22  2:01 PM  Result Value Ref Range Status   Specimen Description   Final    EXPECTORATED SPUTUM Performed at [place] [address], [place], [place] [address]    Special Requests   Final    NONE Reflexed from [id number] Performed at [facility], [place] [address], [place], [place] [address]    Gram Stain   Final    FEW WBC PRESENT, PREDOMINANTLY PMN FEW GRAM POSITIVE COCCI    Culture   Final    Normal respiratory flora-no Staph aureus or Pseudomonas seen Performed at [facility] [address] [place], [place] [address]    Report Status 08/30/2022 FINAL  Final  Culture, blood (Routine X 2) w Reflex to ID Panel     Status: None   Collection Time: 08/29/22 10:10 PM   Specimen: BLOOD  Result Value Ref Range Status   Specimen Description   Final    BLOOD BLOOD RIGHT ARM Performed at [facility] [address], [place], [place] [address]    Special Requests   Final    BOTTLES DRAWN AEROBIC AND ANAEROBIC Blood Culture adequate volume Performed at [facility] [address], [place], [place] [address]    Culture   Final    NO GROWTH 5 DAYS Performed at [facility], [place] [address] [place], [place] [address]    Report Status 09/04/2022 FINAL  Final  Culture, blood (Routine X 2) w  Reflex to ID Panel     Status: None   Collection Time: 08/29/22  10:10 PM   Specimen: BLOOD  Result Value Ref Range Status   Specimen Description   Final    BLOOD BLOOD LEFT ARM Performed at [facility] [address] [place], [place] [address]    Special Requests   Final    BOTTLES DRAWN AEROBIC AND ANAEROBIC Blood Culture adequate volume Performed at [facility] [address], [place],  [address]    Culture   Final    NO GROWTH 5 DAYS Performed at [facility], [place] [address], [place],  [address]    Report Status 09/04/2022 FINAL  Final         Radiology Studies: No results found.      Scheduled Meds:  enoxaparin (LOVENOX) injection  40 mg Subcutaneous Daily   folic acid  1 mg Oral Daily   midodrine  10 mg Oral TID WC   multivitamin with minerals  1 tablet Oral Daily   pantoprazole  40 mg Oral QHS   thiamine  100 mg Oral Daily   Continuous Infusions:  sodium chloride 125 mL/hr at 09/04/22 0723   magnesium sulfate bolus IVPB 1 g (09/04/22 0956)   potassium chloride       LOS: 8 days   Time spent= 35 mins    Joffre Lucks Arsenio Loader, MD [facility]  If 7PM-7AM, please contact night-coverage  09/04/2022, 10:46 AM

## 2022-09-05 DIAGNOSIS — J690 Pneumonitis due to inhalation of food and vomit: Secondary | ICD-10-CM | POA: Diagnosis not present

## 2022-09-05 LAB — BASIC METABOLIC PANEL
Anion gap: 5 (ref 5–15)
BUN: 7 mg/dL — ABNORMAL LOW (ref 8–23)
CO2: 20 mmol/L — ABNORMAL LOW (ref 22–32)
Calcium: 8.3 mg/dL — ABNORMAL LOW (ref 8.9–10.3)
Chloride: 111 mmol/L (ref 98–111)
Creatinine, Ser: 0.62 mg/dL (ref 0.61–1.24)
GFR, Estimated: >60 mL/min (ref >60)
Glucose, Bld: 84 mg/dL (ref 70–99)
Potassium: 3.8 mmol/L (ref 3.5–5.1)
Sodium: 136 mmol/L (ref 135–145)

## 2022-09-05 LAB — CBC
HCT: 24.9 % — ABNORMAL LOW (ref 39.0–52.0)
Hemoglobin: 8 g/dL — ABNORMAL LOW (ref 13.0–17.0)
MCH: 35.7 pg — ABNORMAL HIGH (ref 26.0–34.0)
MCHC: 32.1 g/dL (ref 30.0–36.0)
MCV: 111.2 fL — ABNORMAL HIGH (ref 80.0–100.0)
Platelets: 346 10*3/uL (ref 150–400)
RBC: 2.24 MIL/uL — ABNORMAL LOW (ref 4.22–5.81)
RDW: 15.3 % (ref 11.5–15.5)
WBC: 4.1 10*3/uL (ref 4.0–10.5)
nRBC: 0 % (ref 0.0–0.2)

## 2022-09-05 LAB — MAGNESIUM: Magnesium: 1.7 mg/dL (ref 1.7–2.4)

## 2022-09-05 MED ORDER — METHOCARBAMOL 750 MG PO TABS: 750.0000 mg | ORAL_TABLET | Freq: Three times a day (TID) | ORAL | 0 refills | Status: DC | PRN

## 2022-09-05 MED ORDER — FOLIC ACID 1 MG PO TABS: 1.0000 mg | ORAL_TABLET | Freq: Every day | ORAL | 0 refills | Status: DC

## 2022-09-05 MED ORDER — POTASSIUM CHLORIDE 10 MEQ/100ML IV SOLN
10.0000 meq | INTRAVENOUS | Status: AC
  Administered 2022-09-05 (×2): 10 meq via INTRAVENOUS
  Filled 2022-09-05 (×2): qty 100

## 2022-09-05 MED ORDER — MIDODRINE HCL 10 MG PO TABS: 10.0000 mg | ORAL_TABLET | Freq: Three times a day (TID) | ORAL | 0 refills | Status: DC

## 2022-09-05 MED ORDER — ROSUVASTATIN CALCIUM 10 MG PO TABS: 10.0000 mg | ORAL_TABLET | Freq: Every day | ORAL | 1 refills | Status: DC

## 2022-09-05 MED ORDER — LOPERAMIDE HCL 2 MG PO CAPS: 4.0000 mg | ORAL_CAPSULE | ORAL | 0 refills | Status: DC | PRN

## 2022-09-05 MED ORDER — PANTOPRAZOLE SODIUM 40 MG PO TBEC: 40.0000 mg | DELAYED_RELEASE_TABLET | Freq: Every day | ORAL | 0 refills | Status: DC

## 2022-09-05 MED ORDER — VITAMIN B-1 100 MG PO TABS: 100.0000 mg | ORAL_TABLET | Freq: Every day | ORAL | 0 refills | Status: DC

## 2022-09-05 MED ORDER — MAGNESIUM SULFATE 2 GM/50ML IV SOLN
2.0000 g | Freq: Once | INTRAVENOUS | Status: AC
  Administered 2022-09-05: 2 g via INTRAVENOUS
  Filled 2022-09-05: qty 50

## 2022-09-05 NOTE — Progress Notes (Signed)
Bus passes given to nurse.

## 2022-09-05 NOTE — Progress Notes (Signed)
PROGRESS NOTE    Anthony [name]  CE:[id number] DOB: May 01, 1956 DOA: 08/27/2022 PCP: Jacelyn Grip, MD   Brief Narrative:  67 year old homeless male with medical history significant for alcohol use disorder, prior DTs, anxiety and depression, chronic low back pain, unprovoked PE-completed Xarelto in 2019, HTN, tobacco use disorder presented to the ED on 08/27/2022 with complaints of shortness of breath for last few months associated with on and off lower extremity edema, dyspnea worse over the last few days PTA.  Not on any meds PTA.  Reportedly drinks 84 ounces of beer daily.  CTA chest confirmed no PE but showed dependent airspace disease in the lower lobes of the lungs bilaterally most suggestive of aspiration pneumonitis and developing aspiration pneumonia.  Admitted for aspiration pneumonia.  Course complicated by alcohol withdrawal delirium.  Patient was medically stable for DC to [facility] over the weekend.  However on 2/12, dizzy with symptomatic hypotension/orthostatic hypotension, ongoing issues with hypotension.  His midodrine has been increased.   Assessment & Plan:  Principal Problem:   Aspiration pneumonia ([place]) Active Problems:   Tobacco use disorder   Alcohol use disorder, severe, dependence (HCC)   Acute on chronic diastolic CHF (congestive heart failure) (HCC)   Paroxysmal atrial fibrillation (HCC)   Back pain   Dyslipidemia   Hypomagnesemia   HTN (hypertension)   Macrocytic anemia   Poor social situation   Hiatal hernia   History of pulmonary embolus (PE)   Malnutrition of moderate degree   Aortic atherosclerosis (HCC)     Hypotension/orthostatic hypotension: Improved continue midodrine. IVF prn. Advised po hydration at home and follow up with PCP  Diarrhea; improved.  - Nonspecific.  Wonder if this was related to Augmentin.  Received imodium.    Aspiration pneumonia: -Suspect from alcohol disorder.  At this time patient remains on room air.  CTA chest negative for PE.   Does show some evidence of bilateral pneumonitis.  Initially empirically started on Zosyn > Augmentin.  Completed 7-day course  Severe alcohol use disorder with withdrawal and delirium: Resolved.  Patient denies any intention of quitting.  Continue thiamine, folic acid and multivitamin.   Acute metabolic encephalopathy: Secondary to alcohol withdrawal.  Resolved.   Multiple electrolyte abnormalities (hypokalemia, hypophosphatemia and hypomagnesemia): Replete as needed   Hyponatremia: Resolved May be due to beer potomania.  Stable.   Macrocytic anemia: EtOH use Suspect due to anemia of chronic disease.  Stable.    Tobacco use disorder: Chews tobacco does not smoke.  Reportedly has not been using recently.   Paroxysmal atrial fibrillation: stable.  Medication noncompliance.  Due to his alcohol issue noncompliance he is not safe to start any kind of anticoagulation or antiarrhythmic therapy.  Currently in sinus rhythm.   Dyslipidemia Not on meds PTA   Essential hypertension Not on meds PTA.  Hypotensive now.   History of pulmonary embolism: No PE on CTA chest.  Continue Lovenox for DVT prophylaxis.   Hiatal hernia: Not on meds PTA.   Chronic back pain Judicious use of opioids.  Supportive care.  Patient states that he has L2-4 fracture and chronic back pain for which he takes Tylenol and Robaxin.    Homelessness TOC has been consulted.  As per discussion with TOC team, patient can go to [facility] (shelter[facility] when stable.     As per d/w TOC> [facility] the day center.  It is open Monday to Friday from 8 AM-3 PM, close this from 3 PM to 8 PM and then again open  from 8 AM to 8 AM the next day.  It is also closed on weekends.  She indicates that there are no other shelter options and he is likely already on the waiting list.  Patient has been provided with all resources for homeless shelters at discharge.   Body mass index is 19.14 kg/m.  DVT prophylaxis: SCDs Code Status: Full code Family  Communication:    Status is: Inpatient After his electrolyte repletion, possible discharge later today  Subjective: Seen and examined at bedside.  Patient complaining that he did not get a good night rest last night as his IV was beeping.  I explained to him that nurses try to adjust it.  He continues to be extremely verbally aggressive and abusive towards nursing staff.  He does not want to appropriately answer questions to me. He is using some threattening language towards me and the staff.  Examination: Constitutional: Not in acute distress Respiratory: Clear to auscultation bilaterally Cardiovascular: Normal sinus rhythm, no rubs Abdomen: Nontender nondistended good bowel sounds Musculoskeletal: No edema noted Skin: No rashes seen Neurologic: CN 2-12 grossly intact.  And nonfocal Psychiatric: Normal judgment and insight. Alert and oriented x 3. Normal mood.    Objective: Vitals:   09/04/22 1701 09/04/22 2108 09/05/22 0500 09/05/22 0545  BP: (!) 121/90 106/72  109/83  Pulse: 88 90  92  Resp: 16 19  18  $ Temp: 98.9 F (37.2 C) 98.2 F (36.8 C)  98.7 F (37.1 C)  TempSrc: Oral Oral  Oral  SpO2: 100% 99%  100%  Weight:   64.2 kg   Height:        Intake/Output Summary (Last 24 hours) at 09/05/2022 0801 Last data filed at 09/05/2022 0500 Gross per 24 hour  Intake 2924.21 ml  Output 2175 ml  Net 749.21 ml   Filed Weights   09/03/22 0831 09/04/22 0532 09/05/22 0500  Weight: 61.6 kg 63.5 kg 64.2 kg     Data Reviewed:   CBC: Recent Labs  Lab 09/01/22 0417 09/03/22 0359 09/04/22 0432 09/05/22 0351  WBC 6.8 9.0 6.3 4.1  HGB 10.8* 9.7* 8.3* 8.0*  HCT 33.8* 30.6* 26.0* 24.9*  MCV 110.1* 112.1* 112.6* 111.2*  PLT 202 320 315 [id number]   Basic Metabolic Panel: Recent Labs  Lab 09/01/22 0417 09/01/22 1201 09/02/22 0421 09/03/22 0359 09/04/22 0432 09/05/22 0351  NA 131*  --  131* 130* 133* 136  K 3.7  --  3.8 3.3* 3.5 3.8  CL 98  --  100 103 107 111  CO2 23  --   21* 17* 20* 20*  GLUCOSE 107*  --  96 86 83 84  BUN 10  --  12 10 8 $ 7*  CREATININE 0.65  --  0.81 0.66 0.55* 0.62  CALCIUM 8.5*  --  8.8* 8.3* 8.1* 8.3*  MG 1.3* 2.5* 1.9 1.5* 1.8 1.7  PHOS 3.8  --   --   --   --   --    GFR: Estimated Creatinine Clearance: 82.5 mL/min (by C-G formula based on SCr of 0.62 mg/dL). Liver Function Tests: No results for input(s): "AST", "ALT", "ALKPHOS", "BILITOT", "PROT", "ALBUMIN" in the last 168 hours.  No results for input(s): "LIPASE", "AMYLASE" in the last 168 hours. No results for input(s): "AMMONIA" in the last 168 hours. Coagulation Profile: No results for input(s): "INR", "PROTIME" in the last 168 hours. Cardiac Enzymes: No results for input(s): "CKTOTAL", "CKMB", "CKMBINDEX", "TROPONINI" in the last 168 hours. BNP (last 3 results)  No results for input(s): "PROBNP" in the last 8760 hours. HbA1C: No results for input(s): "HGBA1C" in the last 72 hours. CBG: No results for input(s): "GLUCAP" in the last 168 hours. Lipid Profile: No results for input(s): "CHOL", "HDL", "LDLCALC", "TRIG", "CHOLHDL", "LDLDIRECT" in the last 72 hours. Thyroid Function Tests: No results for input(s): "TSH", "T4TOTAL", "FREET4", "T3FREE", "THYROIDAB" in the last 72 hours.  Anemia Panel: No results for input(s): "VITAMINB12", "FOLATE", "FERRITIN", "TIBC", "IRON", "RETICCTPCT" in the last 72 hours. Sepsis Labs: No results for input(s): "PROCALCITON", "LATICACIDVEN" in the last 168 hours.   Recent Results (from the past 240 hour(s))  Resp panel by RT-PCR (RSV, Flu A&B, Covid) Anterior Nasal Swab     Status: None   Collection Time: 08/27/22  1:41 AM   Specimen: Anterior Nasal Swab  Result Value Ref Range Status   SARS Coronavirus 2 by RT PCR NEGATIVE NEGATIVE Final    Comment: (NOTE) SARS-CoV-2 target nucleic acids are NOT DETECTED.  The SARS-CoV-2 RNA is generally detectable in upper respiratory specimens during the acute phase of infection. The  lowest concentration of SARS-CoV-2 viral copies this assay can detect is 138 copies/mL. A negative result does not preclude SARS-Cov-2 infection and should not be used as the sole basis for treatment or other patient management decisions. A negative result may occur with  improper specimen collection/handling, submission of specimen other than nasopharyngeal swab, presence of viral mutation(s) within the areas targeted by this assay, and inadequate number of viral copies(<138 copies/mL). A negative result must be combined with clinical observations, patient history, and epidemiological information. The expected result is Negative.  Fact Sheet for Patients:  EntrepreneurPulse.com.au  Fact Sheet for Healthcare Providers:  IncredibleEmployment.be  This test is no t yet approved or cleared by the [place] FDA and  has been authorized for detection and/or diagnosis of SARS-CoV-2 by FDA under an Emergency Use Authorization (EUA). This EUA will remain  in effect (meaning this test can be used) for the duration of the COVID-19 declaration under Section 564(b)(1) of the Act, 21 U.S.C.section 360bbb-3(b)(1), unless the authorization is terminated  or revoked sooner.       Influenza A by PCR NEGATIVE NEGATIVE Final   Influenza B by PCR NEGATIVE NEGATIVE Final    Comment: (NOTE) The Xpert Xpress SARS-CoV-2/FLU/RSV plus assay is intended as an aid in the diagnosis of influenza from Nasopharyngeal swab specimens and should not be used as a sole basis for treatment. Nasal washings and aspirates are unacceptable for Xpert Xpress SARS-CoV-2/FLU/RSV testing.  Fact Sheet for Patients: EntrepreneurPulse.com.au  Fact Sheet for Healthcare Providers: IncredibleEmployment.be  This test is not yet approved or cleared by the [place] FDA and has been authorized for detection and/or diagnosis of SARS-CoV-2 by FDA under  an Emergency Use Authorization (EUA). This EUA will remain in effect (meaning this test can be used) for the duration of the COVID-19 declaration under Section 564(b)(1) of the Act, 21 U.S.C. section 360bbb-3(b)(1), unless the authorization is terminated or revoked.     Resp Syncytial Virus by PCR NEGATIVE NEGATIVE Final    Comment: (NOTE) Fact Sheet for Patients: EntrepreneurPulse.com.au  Fact Sheet for Healthcare Providers: IncredibleEmployment.be  This test is not yet approved or cleared by the [place] FDA and has been authorized for detection and/or diagnosis of SARS-CoV-2 by FDA under an Emergency Use Authorization (EUA). This EUA will remain in effect (meaning this test can be used) for the duration of the COVID-19 declaration under Section 564(b)(1) of the  Act, 21 U.S.C. section 360bbb-3(b)(1), unless the authorization is terminated or revoked.  Performed at [facility], [place] [address], [place], [place] [address]   Culture, blood (routine x 2)     Status: None   Collection Time: 08/27/22  5:42 AM   Specimen: BLOOD LEFT HAND  Result Value Ref Range Status   Specimen Description   Final    BLOOD LEFT HAND Performed at [facility] [address] [place], [place] [address]    Special Requests   Final    BOTTLES DRAWN AEROBIC AND ANAEROBIC Blood Culture adequate volume Performed at [facility] [address], [place], [place] [address]    Culture   Final    NO GROWTH 5 DAYS Performed at [facility], [place] [address] [place], [place] [address]    Report Status 09/01/2022 FINAL  Final  Culture, blood (routine x 2)     Status: None   Collection Time: 08/27/22  6:00 AM   Specimen: BLOOD  Result Value Ref Range Status   Specimen Description   Final    BLOOD SITE NOT SPECIFIED Performed at [facility] [address] [place], [place]  [address]    Special Requests   Final    BOTTLES DRAWN AEROBIC AND ANAEROBIC Blood Culture results may not be optimal due to an excessive volume of blood received in culture bottles Performed at [facility] [address] [place] [address], [place], [place] [address]    Culture   Final    NO GROWTH 5 DAYS Performed at [facility], [place] [address] [place], [place] [address]    Report Status 09/01/2022 FINAL  Final  Expectorated Sputum Assessment w Gram Stain, Rflx to Resp Cult     Status: None   Collection Time: 08/27/22  2:01 PM   Specimen: Expectorated Sputum  Result Value Ref Range Status   Specimen Description EXPECTORATED SPUTUM  Final   Special Requests NONE  Final   Sputum evaluation   Final    THIS SPECIMEN IS ACCEPTABLE FOR SPUTUM CULTURE Performed at [facility], [place] [address], [place], [place] [address]    Report Status 08/27/2022 FINAL  Final  Culture, Respiratory w Gram Stain     Status: None   Collection Time: 08/27/22  2:01 PM  Result Value Ref Range Status   Specimen Description   Final    EXPECTORATED SPUTUM Performed at [facility] [address], [place], [place] [address]    Special Requests   Final    NONE Reflexed from [id number] Performed at [facility], [place] [address], [place], [place] [address]    Gram Stain   Final    FEW WBC PRESENT, PREDOMINANTLY PMN FEW GRAM POSITIVE COCCI    Culture   Final    Normal respiratory flora-no Staph aureus or Pseudomonas seen Performed at [facility] [address] [place], [place] [address]    Report Status 08/30/2022 FINAL  Final  Culture, blood (Routine X 2) w Reflex to ID Panel     Status: None   Collection Time: 08/29/22 10:10 PM   Specimen: BLOOD  Result Value Ref Range Status   Specimen Description   Final    BLOOD BLOOD RIGHT ARM Performed at [facility] [address], [place], [place] [address]    Special  Requests   Final    BOTTLES DRAWN AEROBIC  AND ANAEROBIC Blood Culture adequate volume Performed at [facility] [address] [place], [place] [address]    Culture   Final    NO GROWTH 5 DAYS Performed at [facility], [place] [address] [place], [place] [address]    Report Status 09/04/2022 FINAL  Final  Culture, blood (Routine X 2) w Reflex to ID Panel     Status: None   Collection Time: 08/29/22 10:10 PM   Specimen: BLOOD  Result Value Ref Range Status   Specimen Description   Final    BLOOD BLOOD LEFT ARM Performed at [facility] [address] [place], [place] [address]    Special Requests   Final    BOTTLES DRAWN AEROBIC AND ANAEROBIC Blood Culture adequate volume Performed at [facility] [address], [place], [place] [address]    Culture   Final    NO GROWTH 5 DAYS Performed at [facility], [place] [address], [place], [place] [address]    Report Status 09/04/2022 FINAL  Final         Radiology Studies: No results found.      Scheduled Meds:  enoxaparin (LOVENOX) injection  40 mg Subcutaneous Daily   folic acid  1 mg Oral Daily   midodrine  10 mg Oral TID WC   multivitamin with minerals  1 tablet Oral Daily   pantoprazole  40 mg Oral QHS   thiamine  100 mg Oral Daily   Continuous Infusions:  sodium chloride 125 mL/hr at 09/05/22 0349   magnesium sulfate bolus IVPB     potassium chloride       LOS: 9 days   Time spent= 35 mins    Gudrun Axe Arsenio Loader, MD [facility]  If 7PM-7AM, please contact night-coverage  09/05/2022, 8:01 AM

## 2022-09-05 NOTE — Plan of Care (Signed)
  Problem: Education: Goal: Knowledge of General Education information will improve Description: Including pain rating scale, medication(s)/side effects and non-pharmacologic comfort measures Outcome: Adequate for Discharge   Problem: Health Behavior/Discharge Planning: Goal: Ability to manage health-related needs will improve Outcome: Adequate for Discharge   Problem: Clinical Measurements: Goal: Ability to maintain clinical measurements within normal limits will improve Outcome: Adequate for Discharge Goal: Will remain free from infection Outcome: Adequate for Discharge Goal: Diagnostic test results will improve Outcome: Adequate for Discharge Goal: Respiratory complications will improve Outcome: Adequate for Discharge Goal: Cardiovascular complication will be avoided Outcome: Adequate for Discharge   Problem: Activity: Goal: Risk for activity intolerance will decrease Outcome: Adequate for Discharge   Problem: Nutrition: Goal: Adequate nutrition will be maintained Outcome: Adequate for Discharge   Problem: Coping: Goal: Level of anxiety will decrease Outcome: Adequate for Discharge   Problem: Elimination: Goal: Will not experience complications related to bowel motility Outcome: Adequate for Discharge Goal: Will not experience complications related to urinary retention Outcome: Adequate for Discharge   Problem: Pain Managment: Goal: General experience of comfort will improve Outcome: Adequate for Discharge   Problem: Safety: Goal: Ability to remain free from injury will improve Outcome: Adequate for Discharge   Problem: Skin Integrity: Goal: Risk for impaired skin integrity will decrease Outcome: Adequate for Discharge   Problem: Education: Goal: Knowledge of General Education information will improve Description: Including pain rating scale, medication(s)/side effects and non-pharmacologic comfort measures Outcome: Adequate for Discharge   Problem: Health  Behavior/Discharge Planning: Goal: Ability to manage health-related needs will improve Outcome: Adequate for Discharge   Problem: Clinical Measurements: Goal: Ability to maintain clinical measurements within normal limits will improve Outcome: Adequate for Discharge Goal: Will remain free from infection Outcome: Adequate for Discharge Goal: Diagnostic test results will improve Outcome: Adequate for Discharge Goal: Respiratory complications will improve Outcome: Adequate for Discharge Goal: Cardiovascular complication will be avoided Outcome: Adequate for Discharge

## 2022-09-05 NOTE — Progress Notes (Signed)
Discharge instructions reviewed with patient and bus passes provided.  Patient acknowledged understanding.

## 2022-09-06 NOTE — Discharge Summary (Signed)
Physician Discharge Summary  Anthony [name] [id number] DOB: 1955-11-25 DOA: 08/27/2022  PCP: Jacelyn Grip, MD  Admit date: 08/27/2022 Discharge date: 09/06/2022  Admitted From: Home Disposition:  Home  Recommendations for Outpatient Follow-up:  Follow up with PCP in 1-2 weeks Please obtain BMP/CBC in one week your next doctors visit.  Midodrine prescribed Counseled to quit using any illicit drugs   Discharge Condition: Stable CODE STATUS: Full code Diet recommendation: Regular  Brief/Interim Summary: 67 year old homeless male with medical history significant for alcohol use disorder, prior DTs, anxiety and depression, chronic low back pain, unprovoked PE-completed Xarelto in 2019, HTN, tobacco use disorder presented to the ED on 08/27/2022 with complaints of shortness of breath for last few months associated with on and off lower extremity edema, dyspnea worse over the last few days PTA.  Not on any meds PTA.  Reportedly drinks 84 ounces of beer daily.  CTA chest confirmed no PE but showed dependent airspace disease in the lower lobes of the lungs bilaterally most suggestive of aspiration pneumonitis and developing aspiration pneumonia.  Admitted for aspiration pneumonia.  Course complicated by alcohol withdrawal delirium.  Patient was medically stable for DC to [facility] over the weekend.  However on 2/12, dizzy with symptomatic hypotension/orthostatic hypotension, ongoing issues with hypotension.  His midodrine has been increased.  Slowly his condition improved during the hospitalization and was stable for discharge. Rest of the details from progress note from earlier today.    Discharge Diagnoses:  Principal Problem:   Aspiration pneumonia ([place]) Active Problems:   Tobacco use disorder   Alcohol use disorder, severe, dependence (HCC)   Acute on chronic diastolic CHF (congestive heart failure) (HCC)   Paroxysmal atrial fibrillation (HCC)   Back pain   Dyslipidemia   Hypomagnesemia   HTN  (hypertension)   Macrocytic anemia   Poor social situation   Hiatal hernia   History of pulmonary embolus (PE)   Malnutrition of moderate degree   Aortic atherosclerosis ([place])      Discharge Exam: Vitals:   09/05/22 0545 09/05/22 1212  BP: 109/83 118/73  Pulse: 92 80  Resp: 18 19  Temp: 98.7 F (37.1 C) 98.8 F (37.1 C)  SpO2: 100% 100%   Vitals:   09/04/22 2108 09/05/22 0500 09/05/22 0545 09/05/22 1212  BP: 106/72  109/83 118/73  Pulse: 90  92 80  Resp: 19  18 19  $ Temp: 98.2 F (36.8 C)  98.7 F (37.1 C) 98.8 F (37.1 C)  TempSrc: Oral  Oral Oral  SpO2: 99%  100% 100%  Weight:  64.2 kg    Height:         Discharge Instructions   Allergies as of 09/05/2022       Reactions   Other Itching, Other (See Comments)   Seasonal allergies- Itchy eyes, runny nose, congestion   Poison Ivy Extract Rash        Medication List     STOP taking these medications    metoprolol tartrate 25 MG tablet Commonly known as: LOPRESSOR   polyethylene glycol powder 17 GM/SCOOP powder Commonly known as: GLYCOLAX/MIRALAX       TAKE these medications    acetaminophen 500 MG tablet Commonly known as: TYLENOL Take 500-1,000 mg by mouth every 6 (six) hours as needed (for pain, headaches, or dental pain).   aspirin EC 325 MG tablet Take 325 mg by mouth every 6 (six) hours as needed (for pain, headaches, or dental pain).   budesonide-formoterol 160-4.5 MCG/ACT inhaler Commonly  known as: SYMBICORT Inhale 2 puffs into the lungs 2 (two) times daily.   Dulera 100-5 MCG/ACT Aero Generic drug: mometasone-formoterol INHALE 2 PUFFS INTO THE LUNGS TWO TIMES DAILY.   folic acid 1 MG tablet Commonly known as: FOLVITE Take 1 tablet (1 mg total) by mouth daily.   loperamide 2 MG capsule Commonly known as: IMODIUM Take 2 capsules (4 mg total) by mouth as needed for diarrhea or loose stools.   loratadine 10 MG tablet Commonly known as: CLARITIN TAKE 1 TABLET (10 MG TOTAL) BY  MOUTH DAILY.   Metamucil 0.52 g capsule Generic drug: psyllium Take 1 capsule (0.52 g total) by mouth daily.   methocarbamol 750 MG tablet Commonly known as: ROBAXIN Take 1 tablet (750 mg total) by mouth every 8 (eight) hours as needed for muscle spasms.   midodrine 10 MG tablet Commonly known as: PROAMATINE Take 1 tablet (10 mg total) by mouth 3 (three) times daily with meals.   nystatin powder Commonly known as: MYCOSTATIN/NYSTOP Apply topically as needed (irritation).   pantoprazole 40 MG tablet Commonly known as: PROTONIX Take 1 tablet (40 mg total) by mouth at bedtime.   rosuvastatin 10 MG tablet Commonly known as: CRESTOR Take 1 tablet (10 mg total) by mouth daily.   thiamine 100 MG tablet Commonly known as: Vitamin B-1 Take 1 tablet (100 mg total) by mouth daily.        Follow-up Information     Jacelyn Grip, MD Follow up in 1 week(s).   Specialty: Family Medicine Contact information: [place] [address] [phone]                Allergies  Allergen Reactions   Other Itching and Other (See Comments)    Seasonal allergies- Itchy eyes, runny nose, congestion   Poison Ivy Extract Rash    You were cared for by a hospitalist during your hospital stay. If you have any questions about your discharge medications or the care you received while you were in the hospital after you are discharged, you can call the unit and asked to speak with the hospitalist on call if the hospitalist that took care of you is not available. Once you are discharged, your primary care physician will handle any further medical issues. Please note that no refills for any discharge medications will be authorized once you are discharged, as it is imperative that you return to your primary care physician (or establish a relationship with a primary care physician if you do not have one) for your aftercare needs so that they can reassess your need for medications and  monitor your lab values.   Procedures/Studies: ECHOCARDIOGRAM COMPLETE  Result Date: 08/28/2022    ECHOCARDIOGRAM REPORT   Patient Name:   Anthony [name] Date of Exam: 08/28/2022 Medical Rec #:  [id number]    Height:       70.0 in Accession #:    [id number]   Weight:       134.0 lb Date of Birth:  25-Jun-1956    BSA:          1.761 m Patient Age:    89 years     BP:           99/72 mmHg Patient Gender: M            HR:           79 bpm. Exam Location:  Inpatient Procedure: 2D Echo, Cardiac Doppler, Color Doppler and Intracardiac  Opacification Agent Indications:    I50.40* Unspecified combined systolic (congestive) and diastolic                 (congestive) heart failure  History:        Patient has prior history of Echocardiogram examinations, most                 recent 03/17/2022. Signs/Symptoms:Altered Mental Status, Dyspnea                 and Shortness of Breath; Risk Factors:Hypertension and Current                 Smoker. ETOH.  Sonographer:    Roseanna Rainbow RDCS Referring Phys: [phone] ANAND D HONGALGI  Sonographer Comments: Technically difficult study due to poor echo windows, suboptimal parasternal window, suboptimal apical window and suboptimal subcostal window. Image acquisition challenging due to uncooperative patient and Image acquisition challenging due to patient behavioral factors. Patient in restraints attempting to get up during study. Patient supine and could not turn. IMPRESSIONS  1. Left ventricular ejection fraction, by estimation, is 60 to 65%. The left ventricle has normal function. The left ventricle has no regional wall motion abnormalities. Left ventricular diastolic parameters are indeterminate.  2. Right ventricular systolic function is normal. The right ventricular size is mildly enlarged. Tricuspid regurgitation signal is inadequate for assessing PA pressure.  3. The mitral valve is normal in structure. No evidence of mitral valve regurgitation. No evidence of mitral stenosis.  4.  The aortic valve was not well visualized. Aortic valve regurgitation is not visualized. No aortic stenosis is present. FINDINGS  Left Ventricle: Left ventricular ejection fraction, by estimation, is 60 to 65%. The left ventricle has normal function. The left ventricle has no regional wall motion abnormalities. Definity contrast agent was given IV to delineate the left ventricular  endocardial borders. The left ventricular internal cavity size was normal in size. There is no left ventricular hypertrophy. Left ventricular diastolic parameters are indeterminate. Right Ventricle: The right ventricular size is mildly enlarged. Right vetricular wall thickness was not well visualized. Right ventricular systolic function is normal. Tricuspid regurgitation signal is inadequate for assessing PA pressure. Left Atrium: Left atrial size was normal in size. Right Atrium: Right atrial size was normal in size. Pericardium: There is no evidence of pericardial effusion. Presence of epicardial fat layer. Mitral Valve: The mitral valve is normal in structure. No evidence of mitral valve regurgitation. No evidence of mitral valve stenosis. Tricuspid Valve: The tricuspid valve is normal in structure. Tricuspid valve regurgitation is trivial. Aortic Valve: The aortic valve was not well visualized. Aortic valve regurgitation is not visualized. No aortic stenosis is present. Pulmonic Valve: The pulmonic valve was not well visualized. Pulmonic valve regurgitation is not visualized. Aorta: The aortic root is normal in size and structure. IAS/Shunts: The interatrial septum was not well visualized.  LEFT VENTRICLE PLAX 2D LVIDd:         4.40 cm LVIDs:         3.70 cm LV PW:         0.90 cm LV IVS:        0.70 cm LVOT diam:     2.50 cm LV SV:         46 LV SV Index:   26 LVOT Area:     4.91 cm  LV Volumes (MOD) LV vol s, MOD A4C: 53.0 ml RIGHT VENTRICLE RV S prime:     12.20 cm/s TAPSE (  M-mode): 1.3 cm LEFT ATRIUM           Index        RIGHT  ATRIUM           Index LA diam:      3.90 cm 2.21 cm/m   RA Area:     14.10 cm LA Vol (A2C): 9.9 ml  5.62 ml/m   RA Volume:   29.80 ml  16.92 ml/m LA Vol (A4C): 46.6 ml 26.46 ml/m  AORTIC VALVE LVOT Vmax:   59.30 cm/s LVOT Vmean:  37.400 cm/s LVOT VTI:    0.094 m  AORTA Ao Root diam: 3.70 cm MITRAL VALVE MV Area (PHT): 3.50 cm    SHUNTS MV Decel Time: 217 msec    Systemic VTI:  0.09 m MV E velocity: 48.30 cm/s  Systemic Diam: 2.50 cm MV A velocity: 41.15 cm/s MV E/A ratio:  1.17 Oswaldo Milian MD Electronically signed by Oswaldo Milian MD Signature Date/Time: 08/28/2022/5:01:33 PM    Final    CT Angio Chest PE W/Cm &/Or Wo Cm  Result Date: 08/27/2022 CLINICAL DATA:  67 year old male with history of positive D-dimer suspected pulmonary embolism. EXAM: CT ANGIOGRAPHY CHEST WITH CONTRAST TECHNIQUE: Multidetector CT imaging of the chest was performed using the standard protocol during bolus administration of intravenous contrast. Multiplanar CT image reconstructions and MIPs were obtained to evaluate the vascular anatomy. RADIATION DOSE REDUCTION: This exam was performed according to the departmental dose-optimization program which includes automated exposure control, adjustment of the mA and/or kV according to patient size and/or use of iterative reconstruction technique. CONTRAST:  83m OMNIPAQUE IOHEXOL 350 MG/ML SOLN COMPARISON:  Chest CTA 03/23/2022. FINDINGS: Cardiovascular: There are no filling defects within the pulmonary arterial tree to suggest pulmonary embolism. Heart size is mildly enlarged. There is no significant pericardial fluid, thickening or pericardial calcification. There is aortic atherosclerosis, as well as atherosclerosis of the great vessels of the mediastinum and the coronary arteries, including calcified atherosclerotic plaque in the left main, left anterior descending and left circumflex coronary arteries. Mediastinum/Nodes: No pathologically enlarged mediastinal or hilar  lymph nodes. Esophagus is unremarkable in appearance. No axillary lymphadenopathy. Lungs/Pleura: Patchy areas of ground-glass attenuation and airspace consolidation are noted in the dependent portions of the lower lobes bilaterally. In this same distribution there are extensive areas of mucoid impaction, most evident in the left lower lobe. No pleural effusions. No definite suspicious appearing pulmonary nodules or masses are noted. Upper Abdomen: Aortic atherosclerosis. Low attenuation throughout the visualized hepatic parenchyma, indicative of a background of hepatic steatosis. Musculoskeletal: Several old healed bilateral posterior rib fractures are incidentally noted. There are no aggressive appearing lytic or blastic lesions noted in the visualized portions of the skeleton. Review of the MIP images confirms the above findings. IMPRESSION: 1. No evidence of pulmonary embolism. 2. Dependent airspace disease in the lower lobes of the lungs bilaterally, the appearance of which is most suggestive of aspiration pneumonitis and developing aspiration pneumonia. Further clinical evaluation is recommended. 3. Aortic atherosclerosis, in addition to left main and 2 vessel coronary artery disease. Please note that although the presence of coronary artery calcium documents the presence of coronary artery disease, the severity of this disease and any potential stenosis cannot be assessed on this non-gated CT examination. Assessment for potential risk factor modification, dietary therapy or pharmacologic therapy may be warranted, if clinically indicated. 4. Mild cardiomegaly. Aortic Atherosclerosis (ICD10-I70.0). Electronically Signed   By: DVinnie LangtonM.D.   On: 08/27/2022 05:10  DG Chest Port 1 View  Result Date: 08/27/2022 CLINICAL DATA:  Shortness of breath EXAM: PORTABLE CHEST 1 VIEW COMPARISON:  07/15/2022 FINDINGS: Mild chronic elevation of the right hemidiaphragm. Heart and mediastinal contours are within  normal limits. No focal opacities or effusions. No acute bony abnormality. IMPRESSION: No active disease. Electronically Signed   By: Rolm Baptise M.D.   On: 08/27/2022 01:10     The results of significant diagnostics from this hospitalization (including imaging, microbiology, ancillary and laboratory) are listed below for reference.     Microbiology: Recent Results (from the past 240 hour(s))  Expectorated Sputum Assessment w Gram Stain, Rflx to Resp Cult     Status: None   Collection Time: 08/27/22  2:01 PM   Specimen: Expectorated Sputum  Result Value Ref Range Status   Specimen Description EXPECTORATED SPUTUM  Final   Special Requests NONE  Final   Sputum evaluation   Final    THIS SPECIMEN IS ACCEPTABLE FOR SPUTUM CULTURE Performed at [facility], [place] [address] [place] [address], [place], [place] [address]    Report Status 08/27/2022 FINAL  Final  Culture, Respiratory w Gram Stain     Status: None   Collection Time: 08/27/22  2:01 PM  Result Value Ref Range Status   Specimen Description   Final    EXPECTORATED SPUTUM Performed at [facility] [address] [place], [place] [address]    Special Requests   Final    NONE Reflexed from [id number] Performed at [facility], [place] [address], [place], [place] [address]    Gram Stain   Final    FEW WBC PRESENT, PREDOMINANTLY PMN FEW GRAM POSITIVE COCCI    Culture   Final    Normal respiratory flora-no Staph aureus or Pseudomonas seen Performed at [place] [address], [place], [place] [address]    Report Status 08/30/2022 FINAL  Final  Culture, blood (Routine X 2) w Reflex to ID Panel     Status: None   Collection Time: 08/29/22 10:10 PM   Specimen: BLOOD  Result Value Ref Range Status   Specimen Description   Final    BLOOD BLOOD RIGHT ARM Performed at [facility] [address], [place], [place] [address]    Special Requests   Final    BOTTLES  DRAWN AEROBIC AND ANAEROBIC Blood Culture adequate volume Performed at [place] [address], [place], [place] [address]    Culture   Final    NO GROWTH 5 DAYS Performed at [facility], [place] [address] [place], [place] [address]    Report Status 09/04/2022 FINAL  Final  Culture, blood (Routine X 2) w Reflex to ID Panel     Status: None   Collection Time: 08/29/22 10:10 PM   Specimen: BLOOD  Result Value Ref Range Status   Specimen Description   Final    BLOOD BLOOD LEFT ARM Performed at [facility] [address] [place], [place] [address]    Special Requests   Final    BOTTLES DRAWN AEROBIC AND ANAEROBIC Blood Culture adequate volume Performed at [facility] [address] [place], [place] [address]    Culture   Final    NO GROWTH 5 DAYS Performed at [facility], [place] [address] [place], [place] [address]    Report Status 09/04/2022 FINAL  Final     Labs: BNP (last 3  results) Recent Labs    05/16/22 0209 05/19/22 0345 08/27/22 0123  BNP 465.3* 996.8* [id number]*   Basic Metabolic Panel: Recent Labs  Lab 09/01/22 0417 09/01/22 1201 09/02/22 0421 09/03/22 0359 09/04/22 0432 09/05/22 0351  NA 131*  --  131* 130* 133* 136  K 3.7  --  3.8 3.3* 3.5 3.8  CL 98  --  100 103 107 111  CO2 23  --  21* 17* 20* 20*  GLUCOSE 107*  --  96 86 83 84  BUN 10  --  12 10 8 $ 7*  CREATININE 0.65  --  0.81 0.66 0.55* 0.62  CALCIUM 8.5*  --  8.8* 8.3* 8.1* 8.3*  MG 1.3* 2.5* 1.9 1.5* 1.8 1.7  PHOS 3.8  --   --   --   --   --    Liver Function Tests: No results for input(s): "AST", "ALT", "ALKPHOS", "BILITOT", "PROT", "ALBUMIN" in the last 168 hours. No results for input(s): "LIPASE", "AMYLASE" in the last 168 hours. No results for input(s): "AMMONIA" in the last 168 hours. CBC: Recent Labs  Lab 09/01/22 0417 09/03/22 0359 09/04/22 0432 09/05/22 0351  WBC 6.8 9.0 6.3 4.1  HGB 10.8* 9.7* 8.3* 8.0*   HCT 33.8* 30.6* 26.0* 24.9*  MCV 110.1* 112.1* 112.6* 111.2*  PLT 202 320 315 346   Cardiac Enzymes: No results for input(s): "CKTOTAL", "CKMB", "CKMBINDEX", "TROPONINI" in the last 168 hours. BNP: Invalid input(s): "POCBNP" CBG: No results for input(s): "GLUCAP" in the last 168 hours. D-Dimer No results for input(s): "DDIMER" in the last 72 hours. Hgb A1c No results for input(s): "HGBA1C" in the last 72 hours. Lipid Profile No results for input(s): "CHOL", "HDL", "LDLCALC", "TRIG", "CHOLHDL", "LDLDIRECT" in the last 72 hours. Thyroid function studies No results for input(s): "TSH", "T4TOTAL", "T3FREE", "THYROIDAB" in the last 72 hours.  Invalid input(s): "FREET3" Anemia work up No results for input(s): "VITAMINB12", "FOLATE", "FERRITIN", "TIBC", "IRON", "RETICCTPCT" in the last 72 hours. Urinalysis    Component Value Date/Time   COLORURINE YELLOW 07/19/2022 [place] 07/19/2022 0731   LABSPEC 1.019 07/19/2022 0731   PHURINE 5.0 07/19/2022 0731   GLUCOSEU NEGATIVE 07/19/2022 0731   HGBUR NEGATIVE 07/19/2022 0731   BILIRUBINUR NEGATIVE 07/19/2022 0731   KETONESUR NEGATIVE 07/19/2022 0731   PROTEINUR NEGATIVE 07/19/2022 0731   UROBILINOGEN 1.0 10/30/2010 1345   NITRITE NEGATIVE 07/19/2022 0731   LEUKOCYTESUR NEGATIVE 07/19/2022 0731   Sepsis Labs Recent Labs  Lab 09/01/22 0417 09/03/22 0359 09/04/22 0432 09/05/22 0351  WBC 6.8 9.0 6.3 4.1   Microbiology Recent Results (from the past 240 hour(s))  Expectorated Sputum Assessment w Gram Stain, Rflx to Resp Cult     Status: None   Collection Time: 08/27/22  2:01 PM   Specimen: Expectorated Sputum  Result Value Ref Range Status   Specimen Description EXPECTORATED SPUTUM  Final   Special Requests NONE  Final   Sputum evaluation   Final    THIS SPECIMEN IS ACCEPTABLE FOR SPUTUM CULTURE Performed at [facility], [place] [address] [place], [place] [address]    Report Status  08/27/2022 FINAL  Final  Culture, Respiratory w Gram Stain     Status: None   Collection Time: 08/27/22  2:01 PM  Result Value Ref Range Status   Specimen Description   Final    EXPECTORATED SPUTUM Performed at [facility] [address], [place], [place] [address]    Special Requests   Final  NONE Reflexed from [id number] Performed at [facility], [place] [address] [place], [place] [address]    Gram Stain   Final    FEW WBC PRESENT, PREDOMINANTLY PMN FEW GRAM POSITIVE COCCI    Culture   Final    Normal respiratory flora-no Staph aureus or Pseudomonas seen Performed at [facility] [address], [place], [place] [address]    Report Status 08/30/2022 FINAL  Final  Culture, blood (Routine X 2) w Reflex to ID Panel     Status: None   Collection Time: 08/29/22 10:10 PM   Specimen: BLOOD  Result Value Ref Range Status   Specimen Description   Final    BLOOD BLOOD RIGHT ARM Performed at [place] [address], [place], [place] [address]    Special Requests   Final    BOTTLES DRAWN AEROBIC AND ANAEROBIC Blood Culture adequate volume Performed at [facility] [address], [place], [place] [address]    Culture   Final    NO GROWTH 5 DAYS Performed at [facility], [place] [address], [place], [place] [address]    Report Status 09/04/2022 FINAL  Final  Culture, blood (Routine X 2) w Reflex to ID Panel     Status: None   Collection Time: 08/29/22 10:10 PM   Specimen: BLOOD  Result Value Ref Range Status   Specimen Description   Final    BLOOD BLOOD LEFT ARM Performed at [place] [address], [place], [place] [address]    Special Requests   Final    BOTTLES DRAWN AEROBIC AND ANAEROBIC Blood Culture adequate volume Performed at [facility] [address], [place], [place] [address]    Culture   Final    NO GROWTH 5 DAYS Performed at  [facility], [place] [address] [place], [place] [address]    Report Status 09/04/2022 FINAL  Final     Time coordinating discharge:  I have spent 35 minutes face to face with the patient and on the ward discussing the patients care, assessment, plan and disposition with other care givers. >50% of the time was devoted counseling the patient about the risks and benefits of treatment/Discharge disposition and coordinating care.   SIGNED:   Damita Lack, MD  [facility] 09/06/2022, 12:53 PM   If 7PM-7AM, please contact night-coverage

## 2022-09-07 ENCOUNTER — Telehealth: Payer: Self-pay

## 2022-09-07 NOTE — Transitions of Care (Post Inpatient/ED Visit) (Signed)
   09/07/2022  Name: Anthony [name] MRN: [id number] DOB: 05/06/1956  Today's TOC FU Call Status: Today's TOC FU Call Status:: Unsuccessul Call (1st Attempt) Unsuccessful Call (1st Attempt) Date: 09/07/22  Attempted to reach the patient regarding the most recent Inpatient/ED visit.  Follow Up Plan: Additional outreach attempts will be made to reach the patient to complete the Transitions of Care (Post Inpatient/ED visit) call.   Signature  Juanda Crumble, [place] Direct Dial [phone]

## 2022-09-08 NOTE — Transitions of Care (Post Inpatient/ED Visit) (Signed)
   09/08/2022  Name: Anthony [name] MRN: [id number] DOB: [birth year]  Today's TOC FU Call Status: Today's TOC FU Call Status:: Unsuccessful Call (2nd Attempt) Unsuccessful Call (1st Attempt) Date: 09/07/22 Unsuccessful Call (2nd Attempt) Date: 09/08/22  Attempted to reach the patient regarding the most recent Inpatient/ED visit.  Follow Up Plan: Additional outreach attempts will be made to reach the patient to complete the Transitions of Care (Post Inpatient/ED visit) call.   Signature Juanda Crumble, [place] Direct Dial [phone]

## 2022-09-09 NOTE — Transitions of Care (Post Inpatient/ED Visit) (Signed)
   09/09/2022  Name: Anthony [name] MRN: [id number] DOB: [birth year]  Today's TOC FU Call Status: Today's TOC FU Call Status:: Unsuccessful Call (3rd Attempt) Unsuccessful Call (1st Attempt) Date: 09/07/22 Unsuccessful Call (2nd Attempt) Date: 09/08/22 Unsuccessful Call (3rd Attempt) Date: 09/09/22  Attempted to reach the patient regarding the most recent Inpatient/ED visit.  Follow Up Plan: No further outreach attempts will be made at this time. We have been unable to contact the patient.  Signature Juanda Crumble, [place] Direct Dial [phone]

## 2022-09-11 ENCOUNTER — Encounter (HOSPITAL_COMMUNITY): Payer: Self-pay | Admitting: Emergency Medicine

## 2022-09-11 ENCOUNTER — Other Ambulatory Visit: Payer: Self-pay

## 2022-09-11 ENCOUNTER — Emergency Department (HOSPITAL_COMMUNITY): Payer: 59

## 2022-09-11 ENCOUNTER — Inpatient Hospital Stay (HOSPITAL_COMMUNITY)
Admission: EM | Admit: 2022-09-11 | Discharge: 2022-09-17 | DRG: 291 | Disposition: A | Discharge status: Home / Self Care | Payer: 59 | Attending: Internal Medicine | Admitting: Internal Medicine

## 2022-09-11 DIAGNOSIS — E871 Hypo-osmolality and hyponatremia: Secondary | ICD-10-CM | POA: Diagnosis present

## 2022-09-11 DIAGNOSIS — Z5941 Food insecurity: Secondary | ICD-10-CM

## 2022-09-11 DIAGNOSIS — F17200 Nicotine dependence, unspecified, uncomplicated: Secondary | ICD-10-CM | POA: Diagnosis present

## 2022-09-11 DIAGNOSIS — Z86711 Personal history of pulmonary embolism: Secondary | ICD-10-CM

## 2022-09-11 DIAGNOSIS — I4891 Unspecified atrial fibrillation: Secondary | ICD-10-CM | POA: Diagnosis present

## 2022-09-11 DIAGNOSIS — Z79899 Other long term (current) drug therapy: Secondary | ICD-10-CM

## 2022-09-11 DIAGNOSIS — Z823 Family history of stroke: Secondary | ICD-10-CM

## 2022-09-11 DIAGNOSIS — K529 Noninfective gastroenteritis and colitis, unspecified: Secondary | ICD-10-CM | POA: Diagnosis present

## 2022-09-11 DIAGNOSIS — D638 Anemia in other chronic diseases classified elsewhere: Secondary | ICD-10-CM | POA: Diagnosis present

## 2022-09-11 DIAGNOSIS — D509 Iron deficiency anemia, unspecified: Secondary | ICD-10-CM | POA: Diagnosis present

## 2022-09-11 DIAGNOSIS — F1010 Alcohol abuse, uncomplicated: Secondary | ICD-10-CM | POA: Diagnosis present

## 2022-09-11 DIAGNOSIS — I5033 Acute on chronic diastolic (congestive) heart failure: Secondary | ICD-10-CM | POA: Diagnosis present

## 2022-09-11 DIAGNOSIS — Z809 Family history of malignant neoplasm, unspecified: Secondary | ICD-10-CM

## 2022-09-11 DIAGNOSIS — I480 Paroxysmal atrial fibrillation: Secondary | ICD-10-CM | POA: Diagnosis present

## 2022-09-11 DIAGNOSIS — Z720 Tobacco use: Secondary | ICD-10-CM

## 2022-09-11 DIAGNOSIS — I700 Atherosclerosis of aorta: Secondary | ICD-10-CM | POA: Diagnosis present

## 2022-09-11 DIAGNOSIS — I2510 Atherosclerotic heart disease of native coronary artery without angina pectoris: Secondary | ICD-10-CM | POA: Diagnosis present

## 2022-09-11 DIAGNOSIS — E785 Hyperlipidemia, unspecified: Secondary | ICD-10-CM | POA: Diagnosis present

## 2022-09-11 DIAGNOSIS — Z5982 Transportation insecurity: Secondary | ICD-10-CM

## 2022-09-11 DIAGNOSIS — Z8249 Family history of ischemic heart disease and other diseases of the circulatory system: Secondary | ICD-10-CM

## 2022-09-11 DIAGNOSIS — G8929 Other chronic pain: Secondary | ICD-10-CM | POA: Diagnosis present

## 2022-09-11 DIAGNOSIS — Z7951 Long term (current) use of inhaled steroids: Secondary | ICD-10-CM

## 2022-09-11 DIAGNOSIS — Z5901 Sheltered homelessness: Secondary | ICD-10-CM

## 2022-09-11 DIAGNOSIS — K2970 Gastritis, unspecified, without bleeding: Secondary | ICD-10-CM | POA: Diagnosis present

## 2022-09-11 DIAGNOSIS — D539 Nutritional anemia, unspecified: Secondary | ICD-10-CM | POA: Diagnosis present

## 2022-09-11 DIAGNOSIS — F419 Anxiety disorder, unspecified: Secondary | ICD-10-CM | POA: Diagnosis present

## 2022-09-11 DIAGNOSIS — Z7982 Long term (current) use of aspirin: Secondary | ICD-10-CM

## 2022-09-11 DIAGNOSIS — I110 Hypertensive heart disease with heart failure: Principal | ICD-10-CM | POA: Diagnosis present

## 2022-09-11 DIAGNOSIS — Z85828 Personal history of other malignant neoplasm of skin: Secondary | ICD-10-CM

## 2022-09-11 DIAGNOSIS — Z6820 Body mass index (BMI) 20.0-20.9, adult: Secondary | ICD-10-CM

## 2022-09-11 DIAGNOSIS — E440 Moderate protein-calorie malnutrition: Secondary | ICD-10-CM | POA: Diagnosis present

## 2022-09-11 DIAGNOSIS — Z91199 Patient's noncompliance with other medical treatment and regimen due to unspecified reason: Secondary | ICD-10-CM

## 2022-09-11 DIAGNOSIS — E876 Hypokalemia: Secondary | ICD-10-CM | POA: Diagnosis present

## 2022-09-11 DIAGNOSIS — A0472 Enterocolitis due to Clostridium difficile, not specified as recurrent: Secondary | ICD-10-CM | POA: Diagnosis present

## 2022-09-11 DIAGNOSIS — M545 Low back pain, unspecified: Secondary | ICD-10-CM | POA: Diagnosis present

## 2022-09-11 DIAGNOSIS — R197 Diarrhea, unspecified: Secondary | ICD-10-CM | POA: Diagnosis not present

## 2022-09-11 DIAGNOSIS — K2990 Gastroduodenitis, unspecified, without bleeding: Secondary | ICD-10-CM | POA: Diagnosis present

## 2022-09-11 DIAGNOSIS — I10 Essential (primary) hypertension: Secondary | ICD-10-CM | POA: Diagnosis present

## 2022-09-11 LAB — C DIFFICILE QUICK SCREEN W PCR REFLEX
C Diff antigen: POSITIVE — AB
C Diff interpretation: DETECTED
C Diff toxin: POSITIVE — AB

## 2022-09-11 LAB — CBC WITH DIFFERENTIAL/PLATELET
Abs Immature Granulocytes: 0.1 10*3/uL — ABNORMAL HIGH (ref 0.00–0.07)
Basophils Absolute: 0.1 10*3/uL (ref 0.0–0.1)
Basophils Relative: 1 %
Eosinophils Absolute: 0 10*3/uL (ref 0.0–0.5)
Eosinophils Relative: 0 %
HCT: 32 % — ABNORMAL LOW (ref 39.0–52.0)
Hemoglobin: 10.4 g/dL — ABNORMAL LOW (ref 13.0–17.0)
Immature Granulocytes: 1 %
Lymphocytes Relative: 7 %
Lymphs Abs: 1 10*3/uL (ref 0.7–4.0)
MCH: 35.5 pg — ABNORMAL HIGH (ref 26.0–34.0)
MCHC: 32.5 g/dL (ref 30.0–36.0)
MCV: 109.2 fL — ABNORMAL HIGH (ref 80.0–100.0)
Monocytes Absolute: 1.5 10*3/uL — ABNORMAL HIGH (ref 0.1–1.0)
Monocytes Relative: 10 %
Neutro Abs: 11.6 10*3/uL — ABNORMAL HIGH (ref 1.7–7.7)
Neutrophils Relative %: 81 %
Platelets: 539 10*3/uL — ABNORMAL HIGH (ref 150–400)
RBC: 2.93 MIL/uL — ABNORMAL LOW (ref 4.22–5.81)
RDW: 15.4 % (ref 11.5–15.5)
WBC: 14.2 10*3/uL — ABNORMAL HIGH (ref 4.0–10.5)
nRBC: 0 % (ref 0.0–0.2)

## 2022-09-11 LAB — PHOSPHORUS: Phosphorus: 3.3 mg/dL (ref 2.5–4.6)

## 2022-09-11 LAB — COMPREHENSIVE METABOLIC PANEL
ALT: 13 U/L (ref 0–44)
AST: 21 U/L (ref 15–41)
Albumin: 2.4 g/dL — ABNORMAL LOW (ref 3.5–5.0)
Alkaline Phosphatase: 77 U/L (ref 38–126)
Anion gap: 13 (ref 5–15)
BUN: 7 mg/dL — ABNORMAL LOW (ref 8–23)
CO2: 18 mmol/L — ABNORMAL LOW (ref 22–32)
Calcium: 7.4 mg/dL — ABNORMAL LOW (ref 8.9–10.3)
Chloride: 100 mmol/L (ref 98–111)
Creatinine, Ser: 0.73 mg/dL (ref 0.61–1.24)
GFR, Estimated: >60 mL/min (ref >60)
Glucose, Bld: 92 mg/dL (ref 70–99)
Potassium: 3.1 mmol/L — ABNORMAL LOW (ref 3.5–5.1)
Sodium: 131 mmol/L — ABNORMAL LOW (ref 135–145)
Total Bilirubin: 0.6 mg/dL (ref 0.3–1.2)
Total Protein: 6 g/dL — ABNORMAL LOW (ref 6.5–8.1)

## 2022-09-11 LAB — MAGNESIUM: Magnesium: 1.4 mg/dL — ABNORMAL LOW (ref 1.7–2.4)

## 2022-09-11 LAB — TROPONIN I (HIGH SENSITIVITY)
Troponin I (High Sensitivity): 7 ng/L (ref <18)
Troponin I (High Sensitivity): 8 ng/L (ref <18)

## 2022-09-11 LAB — BRAIN NATRIURETIC PEPTIDE: B Natriuretic Peptide: 484 pg/mL — ABNORMAL HIGH (ref 0.0–100.0)

## 2022-09-11 MED ORDER — METHOCARBAMOL 500 MG PO TABS
750.0000 mg | ORAL_TABLET | Freq: Three times a day (TID) | ORAL | Status: DC | PRN
  Administered 2022-09-11 – 2022-09-17 (×12): 750 mg via ORAL
  Filled 2022-09-11 (×12): qty 2

## 2022-09-11 MED ORDER — DILTIAZEM HCL-DEXTROSE 125-5 MG/125ML-% IV SOLN (PREMIX)
5.0000 mg/h | INTRAVENOUS | Status: DC
  Administered 2022-09-11 – 2022-09-12 (×2): 5 mg/h via INTRAVENOUS
  Filled 2022-09-11 (×3): qty 125

## 2022-09-11 MED ORDER — METOPROLOL TARTRATE 5 MG/5ML IV SOLN
5.0000 mg | Freq: Once | INTRAVENOUS | Status: AC
  Administered 2022-09-11: 5 mg via INTRAVENOUS
  Filled 2022-09-11: qty 5

## 2022-09-11 MED ORDER — ACETAMINOPHEN 650 MG RE SUPP: 650.0000 mg | Freq: Four times a day (QID) | RECTAL | Status: DC | PRN

## 2022-09-11 MED ORDER — THIAMINE MONONITRATE 100 MG PO TABS
100.0000 mg | ORAL_TABLET | Freq: Every day | ORAL | Status: DC
  Administered 2022-09-12 – 2022-09-17 (×6): 100 mg via ORAL
  Filled 2022-09-11 (×6): qty 1

## 2022-09-11 MED ORDER — POTASSIUM CHLORIDE CRYS ER 20 MEQ PO TBCR
20.0000 meq | EXTENDED_RELEASE_TABLET | Freq: Every day | ORAL | Status: DC
  Administered 2022-09-12: 20 meq via ORAL
  Filled 2022-09-11 (×2): qty 1

## 2022-09-11 MED ORDER — MOMETASONE FURO-FORMOTEROL FUM 200-5 MCG/ACT IN AERO
2.0000 | INHALATION_SPRAY | Freq: Two times a day (BID) | RESPIRATORY_TRACT | Status: DC
  Administered 2022-09-12 – 2022-09-17 (×11): 2 via RESPIRATORY_TRACT
  Filled 2022-09-11: qty 8.8

## 2022-09-11 MED ORDER — VANCOMYCIN HCL 125 MG PO CAPS
125.0000 mg | ORAL_CAPSULE | Freq: Four times a day (QID) | ORAL | Status: DC
  Administered 2022-09-11 – 2022-09-17 (×24): 125 mg via ORAL
  Filled 2022-09-11 (×25): qty 1

## 2022-09-11 MED ORDER — VITAMIN B-12 1000 MCG PO TABS
1000.0000 ug | ORAL_TABLET | Freq: Every day | ORAL | Status: DC
  Administered 2022-09-11 – 2022-09-17 (×7): 1000 ug via ORAL
  Filled 2022-09-11 (×7): qty 1

## 2022-09-11 MED ORDER — DILTIAZEM LOAD VIA INFUSION
20.0000 mg | Freq: Once | INTRAVENOUS | Status: AC
  Administered 2022-09-11: 20 mg via INTRAVENOUS
  Filled 2022-09-11: qty 20

## 2022-09-11 MED ORDER — ACETAMINOPHEN 325 MG PO TABS
650.0000 mg | ORAL_TABLET | Freq: Four times a day (QID) | ORAL | Status: DC | PRN
  Administered 2022-09-11 – 2022-09-16 (×12): 650 mg via ORAL
  Filled 2022-09-11 (×13): qty 2

## 2022-09-11 MED ORDER — PROCHLORPERAZINE EDISYLATE 10 MG/2ML IJ SOLN: 10.0000 mg | Freq: Four times a day (QID) | INTRAMUSCULAR | Status: DC | PRN

## 2022-09-11 MED ORDER — POTASSIUM CHLORIDE CRYS ER 20 MEQ PO TBCR
40.0000 meq | EXTENDED_RELEASE_TABLET | Freq: Once | ORAL | Status: AC
  Administered 2022-09-11: 40 meq via ORAL
  Filled 2022-09-11: qty 2

## 2022-09-11 MED ORDER — FUROSEMIDE 10 MG/ML IJ SOLN
20.0000 mg | Freq: Every day | INTRAMUSCULAR | Status: DC
  Administered 2022-09-11 – 2022-09-12 (×2): 20 mg via INTRAVENOUS
  Filled 2022-09-11 (×2): qty 2

## 2022-09-11 MED ORDER — MAGNESIUM SULFATE 2 GM/50ML IV SOLN
2.0000 g | Freq: Once | INTRAVENOUS | Status: AC
  Administered 2022-09-11: 2 g via INTRAVENOUS
  Filled 2022-09-11: qty 50

## 2022-09-11 MED ORDER — PANTOPRAZOLE SODIUM 40 MG PO TBEC
40.0000 mg | DELAYED_RELEASE_TABLET | Freq: Every day | ORAL | Status: DC
  Administered 2022-09-11: 40 mg via ORAL
  Filled 2022-09-11 (×2): qty 1

## 2022-09-11 MED ORDER — FOLIC ACID 1 MG PO TABS
1.0000 mg | ORAL_TABLET | Freq: Every day | ORAL | Status: DC
  Administered 2022-09-12 – 2022-09-17 (×6): 1 mg via ORAL
  Filled 2022-09-11 (×6): qty 1

## 2022-09-11 MED ORDER — LACTATED RINGERS IV SOLN: INTRAVENOUS | Status: DC

## 2022-09-11 MED ORDER — ROSUVASTATIN CALCIUM 10 MG PO TABS
10.0000 mg | ORAL_TABLET | Freq: Every day | ORAL | Status: DC
  Administered 2022-09-12 – 2022-09-17 (×6): 10 mg via ORAL
  Filled 2022-09-11 (×6): qty 1

## 2022-09-11 MED ORDER — LACTATED RINGERS IV BOLUS
1000.0000 mL | Freq: Once | INTRAVENOUS | Status: AC
  Administered 2022-09-11: 1000 mL via INTRAVENOUS

## 2022-09-11 MED ORDER — PSYLLIUM 0.52 G PO CAPS: 0.5200 g | ORAL_CAPSULE | Freq: Three times a day (TID) | ORAL | Status: DC

## 2022-09-11 NOTE — ED Provider Notes (Signed)
[place] EMERGENCY DEPARTMENT AT [facility] Provider Note   CSN: [id number] Arrival date & time: 09/11/22  [id number]     History  Chief Complaint  Patient presents with   Diarrhea   Weakness    Anthony [name] is a 67 y.o. male.  67 year old male presents with chronic diarrhea along with weakness.  No associate abdominal pain with his diarrhea.  Diarrhea described as being watery.  Patient states that diarrhea has been there for for quite some time.  Denies any prior history of C. difficile.  Patient discharged from the hospital several days ago and was initially homeless but is now living in an apartment.  Denies any fever or chills.  No vomiting noted.  No abdominal discomfort.  EMS called and patient found to be in A-fib with RVR.  Transported here for further management       Home Medications Prior to Admission medications   Medication Sig Start Date End Date Taking? Authorizing Provider  acetaminophen (TYLENOL) 500 MG tablet Take 500-1,000 mg by mouth every 6 (six) hours as needed (for pain, headaches, or dental pain).    [provider]  aspirin EC 325 MG tablet Take 325 mg by mouth every 6 (six) hours as needed (for pain, headaches, or dental pain).    [provider]  budesonide-formoterol (SYMBICORT) 160-4.5 MCG/ACT inhaler Inhale 2 puffs into the lungs 2 (two) times daily. 08/12/22 09/11/22  [provider]  folic acid (FOLVITE) 1 MG tablet Take 1 tablet (1 mg total) by mouth daily. 09/06/22   Amin, Jeanella Flattery, MD  loperamide (IMODIUM) 2 MG capsule Take 2 capsules (4 mg total) by mouth as needed for diarrhea or loose stools. 09/05/22   Amin, Jeanella Flattery, MD  loratadine (CLARITIN) 10 MG tablet TAKE 1 TABLET (10 MG TOTAL) BY MOUTH DAILY. Patient not taking: Reported on 07/23/2022 10/08/20 07/23/22  Lilland, Lorrin Goodell, DO  methocarbamol (ROBAXIN) 750 MG tablet Take 1 tablet (750 mg total) by mouth every 8 (eight) hours as needed for muscle spasms.  09/05/22   Amin, Jeanella Flattery, MD  midodrine (PROAMATINE) 10 MG tablet Take 1 tablet (10 mg total) by mouth 3 (three) times daily with meals. 09/05/22   Amin, Ankit Chirag, MD  mometasone-formoterol (DULERA) 100-5 MCG/ACT AERO INHALE 2 PUFFS INTO THE LUNGS TWO TIMES DAILY. 03/19/22 03/19/23  Sharion Settler, DO  nystatin (MYCOSTATIN/NYSTOP) powder Apply topically as needed (irritation). Patient not taking: Reported on 07/23/2022 06/02/22   Arlyce Dice, MD  pantoprazole (PROTONIX) 40 MG tablet Take 1 tablet (40 mg total) by mouth at bedtime. 09/05/22   Amin, Ankit Chirag, MD  psyllium (METAMUCIL) 0.52 g capsule Take 1 capsule (0.52 g total) by mouth daily. Patient not taking: Reported on 08/27/2022 07/24/22   Leanord Asal K, DO  rosuvastatin (CRESTOR) 10 MG tablet Take 1 tablet (10 mg total) by mouth daily. 09/05/22   Amin, Jeanella Flattery, MD  thiamine (VITAMIN B-1) 100 MG tablet Take 1 tablet (100 mg total) by mouth daily. 09/06/22   Damita Lack, MD      Allergies    Other and Poison ivy extract    Review of Systems   Review of Systems  All other systems reviewed and are negative.   Physical Exam Updated Vital Signs BP 129/81   Pulse (!) 156   Temp 99.1 F (37.3 C) (Oral)   Resp 18   Ht 1.727 m ('5\' 8"'$ )   Wt 61.2 kg   SpO2 100%  BMI 20.53 kg/m  Physical Exam Vitals and nursing note reviewed.  Constitutional:      General: He is not in acute distress.    Appearance: Normal appearance. He is well-developed. He is not toxic-appearing.  HENT:     Head: Normocephalic and atraumatic.  Eyes:     General: Lids are normal.     Conjunctiva/sclera: Conjunctivae normal.     Pupils: Pupils are equal, round, and reactive to light.  Neck:     Thyroid: No thyroid mass.     Trachea: No tracheal deviation.  Cardiovascular:     Rate and Rhythm: Tachycardia present. Rhythm irregular.     Heart sounds: Normal heart sounds. No murmur heard.    No gallop.  Pulmonary:     Effort:  Pulmonary effort is normal. No respiratory distress.     Breath sounds: No stridor. Examination of the right-lower field reveals decreased breath sounds. Examination of the left-lower field reveals decreased breath sounds. Decreased breath sounds present. No wheezing, rhonchi or rales.  Abdominal:     General: There is no distension.     Palpations: Abdomen is soft.     Tenderness: There is no abdominal tenderness. There is no rebound.  Musculoskeletal:        General: No tenderness. Normal range of motion.     Cervical back: Normal range of motion and neck supple.     Comments: Bilater LE edema  Skin:    General: Skin is warm and dry.     Findings: No abrasion or rash.  Neurological:     Mental Status: He is alert and oriented to person, place, and time. Mental status is at baseline.     GCS: GCS eye subscore is 4. GCS verbal subscore is 5. GCS motor subscore is 6.     Cranial Nerves: No cranial nerve deficit.     Sensory: No sensory deficit.     Motor: Motor function is intact.  Psychiatric:        Attention and Perception: Attention normal.        Speech: Speech normal.        Behavior: Behavior normal.     ED Results / Procedures / Treatments   Labs (all labs ordered are listed, but only abnormal results are displayed) Labs Reviewed  CBC WITH DIFFERENTIAL/PLATELET  COMPREHENSIVE METABOLIC PANEL  BRAIN NATRIURETIC PEPTIDE  TROPONIN I (HIGH SENSITIVITY)    EKG EKG Interpretation  Date/Time:  Friday September 11 2022 09:44:39 EST Ventricular Rate:  155 PR Interval:    QRS Duration: 69 QT Interval:  296 QTC Calculation: 476 R Axis:   83 Text Interpretation: Atrial fibrillation with rapid V-rate Borderline right axis deviation Repolarization abnormality, prob rate related Confirmed by Lacretia Leigh ([id number]) on 09/11/2022 11:26:14 AM  Radiology No results found.  Procedures Procedures    Medications Ordered in ED Medications  lactated ringers infusion (  Intravenous New Bag/Given 09/11/22 1022)  diltiazem (CARDIZEM) 1 mg/mL load via infusion 20 mg (has no administration in time range)    And  diltiazem (CARDIZEM) 125 mg in dextrose 5% 125 mL (1 mg/mL) infusion (has no administration in time range)  lactated ringers bolus 1,000 mL (1,000 mLs Intravenous New Bag/Given 09/11/22 1020)    ED Course/ Medical Decision Making/ A&P                             Medical Decision Making Amount and/or Complexity of  Data Reviewed Labs: ordered. Radiology: ordered. ECG/medicine tests: ordered.  Risk Prescription drug management.   Presents with chronic diarrhea as well as weakness with A-fib with RVR.  Patient does have a history of paroxysmal A-fib but is not on medications for this.  Patient is unsure when his symptoms started.  Patient given Cardizem 20 mg bolus here and placed on Cardizem drip and rate is more controlled at this time.  Chest x-ray without evidence of heart failure per my interpretation.  Stool cultures ordered for patient diarrhea.  Is mildly hypokalemic likely from his diarrhea and will be given potassium supplementation.  Albumin and protein are also low.  Mild leukocytosis noted on CBC of unclear etiology.  Will check urinalysis.  Opponent is negative here.  Low suspicion for ACS.  BMP is pending at this time.  Plan will be for hospitalist admission  CRITICAL CARE Performed by: Leota Jacobsen Total critical care time: 50 minutes Critical care time was exclusive of separately billable procedures and treating other patients. Critical care was necessary to treat or prevent imminent or life-threatening deterioration. Critical care was time spent personally by me on the following activities: development of treatment plan with patient and/or surrogate as well as nursing, discussions with consultants, evaluation of patient's response to treatment, examination of patient, obtaining history from patient or surrogate, ordering and performing  treatments and interventions, ordering and review of laboratory studies, ordering and review of radiographic studies, pulse oximetry and re-evaluation of patient's condition.'        Final Clinical Impression(s) / ED Diagnoses Final diagnoses:  None    Rx / DC Orders ED Discharge Orders     None         Lacretia Leigh, MD 09/11/22 1130

## 2022-09-11 NOTE — ED Triage Notes (Signed)
Pt to ER from home with c/o diarrhea and weakness.  Pt was recently discharged from hospital.  Pt noted to be in Afib RVR by EMS, no reports of Chest pain or other c/o at this time.  Pt reports diarrhea was present during hospitalization.

## 2022-09-11 NOTE — Progress Notes (Signed)
[facility] admitting physician addendum:  The patient's C. difficile antigen and toxin were positive.  C. difficile colitis treatment order set placed.  He will be treated with vancomycin 125 mg p.o. 4 times a day.  Tennis Must, MD

## 2022-09-11 NOTE — ED Notes (Signed)
ED TO INPATIENT HANDOFF REPORT  ED Nurse Name and Phone #: Anderson Malta [phone]  S Name/Age/Gender Anthony [name] 67 y.o. male Room/Bed: WA10/WA10  Code Status   Code Status: Prior  Home/SNF/Other Home Patient oriented to: self, place, time, and situation Is this baseline? Yes   Triage Complete: Triage complete  Chief Complaint Atrial fibrillation with RVR ([place]) [I48.91]  Triage Note Pt to ER from home with c/o diarrhea and weakness.  Pt was recently discharged from hospital.  Pt noted to be in Afib RVR by EMS, no reports of Chest pain or other c/o at this time.  Pt reports diarrhea was present during hospitalization.   Allergies Allergies  Allergen Reactions   Other Itching and Other (See Comments)    Seasonal allergies- Itchy eyes, runny nose, congestion   Poison Ivy Extract Rash    Level of Care/Admitting Diagnosis ED Disposition     ED Disposition  Admit   Condition  --   Comment  H[facility] Area: [facility] [[id number]]  Level of Care: Progressive [102]  Admit to Progressive based on following criteria: CARDIOVASCULAR & THORACIC of moderate stability with acute coronary syndrome symptoms/low risk myocardial infarction/hypertensive urgency/arrhythmias/heart failure potentially compromising stability and stable post cardiovascular intervention patients.  May place patient in observation at [facility] or [facility] if equivalent level of care is available:: No  Covid Evaluation: Asymptomatic - no recent exposure (last 10 days) testing not required  Diagnosis: Atrial fibrillation with RVR [facility]) [id number]  Admitting Physician: Reubin Milan [id number]  Attending Physician: Reubin Milan [id number]          B Medical/Surgery History Past Medical History:  Diagnosis Date   Actinic keratosis 11/27/2016   Alcohol abuse with alcohol-induced mood disorder ([place]) 07/18/2018   Alcohol use disorder, severe, dependence ([place]) 09/16/2006    05/22/2015 Argumentative, belligerent and verbally abusive to staff per his note from [facility])    Anxiety state 04/25/2018   Aortic atherosclerosis ([place]) 08/27/2022   Chronic low back pain 09/16/2006   Followed by NS.  Per his chart from [place], he fell off two storyhouse roof while cleaning his brother's gutters and sustained compression fracture of L3, L4 and L5 and fracture of right transverse process at L3 and nondisplaced fracture of left glenoid rim many years ago. He had multiple imaging in [place] including Lumbar MRI in 2016 which showed moderate to severe spinal canal stenos   Delirium tremens ([place]) 09/01/2017   Depression    Dry eye 11/23/2016   Foreign body in middle portion of esophagus 05/19/2022   Hiatal hernia 09/14/2016   Status Collis gastroplasty and Nissen's fundoplication on [id number] in [place]   History of delirium tremons 07/22/2020   DTs during 10/2016 08/2017. And 12/2017 admissions    History of pulmonary embolus (PE) 11/02/2016   Unprovoked 11/01/2016. Xarelto from 11/01/16 to 09/08/2017.   Hydrocele    Surgically corrected   Hypertension    Hypoalbuminemia due to protein-calorie malnutrition ([place]) 07/22/2020   Lumbar compression fracture (H[facility])    Multiple rib fractures 07/22/2019   Left rib details XR: left ribs demonstrate multiple remote rib   Pancytopenia ([place])    Rhabdomyolysis 07/22/2020   Skin cancer    exciced 2017   Tinea versicolor 06/08/2013   Tobacco use disorder 07/22/2020   Tooth infection 04/25/2018   Trigger finger, acquired 11/18/2012   Ulnar tunnel syndrome of right wrist 11/08/2012   Past Surgical History:  Procedure Laterality  Date   BIOPSY  05/21/2022   Procedure: BIOPSY;  Surgeon: [name], MD;  Location: [facility];  Service: Gastroenterology;;   CATARACT EXTRACTION     ESOPHAGOGASTRODUODENOSCOPY (EGD) WITH PROPOFOL N/A 05/21/2022   Procedure: ESOPHAGOGASTRODUODENOSCOPY (EGD) WITH  PROPOFOL;  Surgeon: [name], MD;  Location: [place];  Service: Gastroenterology;  Laterality: N/A;   FOREIGN BODY REMOVAL N/A 05/21/2022   Procedure: FOREIGN BODY REMOVAL;  Surgeon: [name], MD;  Location: [place];  Service: Gastroenterology;  Laterality: N/A;   HIATAL HERNIA REPAIR  2016   HYDROCELE EXCISION / REPAIR  2010   SKIN CANCER EXCISION Left    Excised 2017     A IV Location/Drains/Wounds Patient Lines/Drains/Airways Status     Active Line/Drains/Airways     Name Placement date Placement time Site Days   Peripheral IV 09/11/22 20 G Left Antecubital 09/11/22  0957  Antecubital  less than 1   Pressure Injury 08/14/20 Coccyx Medial Stage 1 -  Intact skin with non-blanchable redness of a localized area usually over a bony prominence. 08/14/20  1530  -- 758   Wound / Incision (Open or Dehisced) 03/16/22 Non-pressure wound Toe (Comment  which one) Anterior;Left 03/16/22  1430  Toe (Comment  which one)  179            Intake/Output Last 24 hours No intake or output data in the 24 hours ending 09/11/22 1214  Labs/Imaging Results for orders placed or performed during the hospital encounter of 09/11/22 (from the past 48 hour(s))  CBC with Differential/Platelet     Status: Abnormal   Collection Time: 09/11/22 10:29 AM  Result Value Ref Range   WBC 14.2 (H) 4.0 - 10.5 K/uL   RBC 2.93 (L) 4.22 - 5.81 MIL/uL   Hemoglobin 10.4 (L) 13.0 - 17.0 g/dL   HCT 32.0 (L) 39.0 - 52.0 %   MCV 109.2 (H) 80.0 - 100.0 fL   MCH 35.5 (H) 26.0 - 34.0 pg   MCHC 32.5 30.0 - 36.0 g/dL   RDW 15.4 11.5 - 15.5 %   Platelets 539 (H) 150 - 400 K/uL   nRBC 0.0 0.0 - 0.2 %   Neutrophils Relative % 81 %   Neutro Abs 11.6 (H) 1.7 - 7.7 K/uL   Lymphocytes Relative 7 %   Lymphs Abs 1.0 0.7 - 4.0 K/uL   Monocytes Relative 10 %   Monocytes Absolute 1.5 (H) 0.1 - 1.0 K/uL   Eosinophils Relative 0 %   Eosinophils Absolute 0.0 0.0 - 0.5 K/uL   Basophils Relative 1 %   Basophils  Absolute 0.1 0.0 - 0.1 K/uL   Immature Granulocytes 1 %   Abs Immature Granulocytes 0.10 (H) 0.00 - 0.07 K/uL    Comment: Performed at [facility], [place] [address], [place], [place] [address]  Comprehensive metabolic panel     Status: Abnormal   Collection Time: 09/11/22 10:29 AM  Result Value Ref Range   Sodium 131 (L) 135 - 145 mmol/L   Potassium 3.1 (L) 3.5 - 5.1 mmol/L   Chloride 100 98 - 111 mmol/L   CO2 18 (L) 22 - 32 mmol/L   Glucose, Bld 92 70 - 99 mg/dL    Comment: Glucose reference range applies only to samples taken after fasting for at least 8 hours.   BUN 7 (L) 8 - 23 mg/dL   Creatinine, Ser 0.73 0.61 - 1.24 mg/dL   Calcium 7.4 (L) 8.9 - 10.3 mg/dL   Total  Protein 6.0 (L) 6.5 - 8.1 g/dL   Albumin 2.4 (L) 3.5 - 5.0 g/dL   AST 21 15 - 41 U/L   ALT 13 0 - 44 U/L   Alkaline Phosphatase 77 38 - 126 U/L   Total Bilirubin 0.6 0.3 - 1.2 mg/dL   GFR, Estimated >60 >60 mL/min    Comment: (NOTE) Calculated using the CKD-EPI Creatinine Equation (2021)    Anion gap 13 5 - 15    Comment: Performed at [facility], [place] [address] [place], [place] [address]  Brain natriuretic peptide     Status: Abnormal   Collection Time: 09/11/22 10:29 AM  Result Value Ref Range   B Natriuretic Peptide 484.0 (H) 0.0 - 100.0 pg/mL    Comment: Performed at [facility], [place] [address], [place], [place] [address]  Troponin I (High Sensitivity)     Status: None   Collection Time: 09/11/22 10:29 AM  Result Value Ref Range   Troponin I (High Sensitivity) 7 <18 ng/L    Comment: (NOTE) Elevated high sensitivity troponin I (hsTnI) values and significant  changes across serial measurements may suggest ACS but many other  chronic and acute conditions are known to elevate hsTnI results.  Refer to the "Links" section for chest pain algorithms and additional  guidance. Performed at [facility], [place] [address], [place],  [place] [address]    DG Chest Port 1 View  Result Date: 09/11/2022 CLINICAL DATA:  Shortness of breath.  Diarrhea and weakness. EXAM: PORTABLE CHEST 1 VIEW COMPARISON:  Single-view of the chest 08/27/2022. PA and lateral chest 07/15/2022. CT chest 08/27/2022 FINDINGS: There is basilar atelectasis on the left. The lungs are otherwise clear. Heart size is upper normal. Aortic atherosclerosis noted. No pneumothorax or pleural fluid. No acute bony abnormality. IMPRESSION: No acute disease. Electronically Signed   By: Inge Rise M.D.   On: 09/11/2022 11:13    Pending Labs Unresulted Labs (From admission, onward)     Start     Ordered   09/11/22 1158  Magnesium  Add-on,   AD        09/11/22 1157   09/11/22 1158  Phosphorus  Add-on,   AD        09/11/22 1157   09/11/22 1029  C Difficile Quick Screen w PCR reflex  (C Difficile quick screen w PCR reflex panel )  Once, for 24 hours,   URGENT       References:    CDiff Information Tool   09/11/22 1028            Vitals/Pain Today's Vitals   09/11/22 0939 09/11/22 0940 09/11/22 0941 09/11/22 1108  BP: 129/81   118/70  Pulse: (!) 156   (!) 115  Resp: 18   (!) 22  Temp: 99.1 F (37.3 C)     TempSrc: Oral     SpO2: 100%   99%  Weight:   61.2 kg   Height:   '5\' 8"'$  (1.727 m)   PainSc:  7       Isolation Precautions Enteric precautions (UV disinfection)  Medications Medications  lactated ringers infusion ( Intravenous New Bag/Given 09/11/22 1022)  diltiazem (CARDIZEM) 1 mg/mL load via infusion 20 mg (20 mg Intravenous Bolus from Bag 09/11/22 1049)    And  diltiazem (CARDIZEM) 125 mg in dextrose 5% 125 mL (1 mg/mL) infusion (5 mg/hr Intravenous New Bag/Given 09/11/22 1049)  potassium chloride SA (KLOR-CON M) CR tablet 40  mEq (has no administration in time range)  metoprolol tartrate (LOPRESSOR) injection 5 mg (has no administration in time range)  magnesium sulfate IVPB 2 g 50 mL (has no administration in time range)  lactated ringers bolus  1,000 mL (0 mLs Intravenous Stopped 09/11/22 1109)    Mobility walks with person assist     Focused Assessments Cardiac Assessment Handoff:    Lab Results  Component Value Date   CKTOTAL 829 (H) 07/22/2020   TROPONINI 0.03 (HH) 11/02/2016   Lab Results  Component Value Date   DDIMER 1.82 (H) 08/27/2022   Does the Patient currently have chest pain? No    R Recommendations: See Admitting Provider Note  Report given to:   Additional Notes:

## 2022-09-11 NOTE — Care Management Obs Status (Signed)
[place] NOTIFICATION   Patient Details  Name: Anthony [name] MRN: [id number] Date of Birth: 1955/08/11   Medicare Observation Status Notification Given:  Yes    Dessa Phi, RN 09/11/2022, 3:03 PM

## 2022-09-11 NOTE — H&P (Signed)
History and Physical    Patient: Anthony [name] [id number] DOB: October 25, 1955 DOA: 09/11/2022 DOS: the patient was seen and examined on 09/11/2022 PCP: Jacelyn Grip, MD  Patient coming from: Home  Chief Complaint:  Chief Complaint  Patient presents with   Diarrhea   Weakness   HPI: [name] is a 67 y.o. 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  He was found to be in atrial fibrillation with RVR. No chest pain, palpitations, diaphoresis, PND, orthopnea or recent pitting edema of the lower extremities according to the patient. He denied fever, chills, rhinorrhea, sore throat, wheezing or hemoptysis. No abdominal pain, nausea, emesis, constipation, melena or hematochezia.  No flank pain, dysuria, frequency or hematuria.  No polyuria, polydipsia, polyphagia or blurred vision.  Metoprolol was held after the last admission due to hypotension.  Patient has being on midodrine in the past.  Lab work: CBC showed a white count of 14.2, hemoglobin 10.4 g/dL platelets 539.  Troponin was 7 ng/L.  BNP 484.0 pg/mL.  Sodium 131, potassium 3.1, chloride 100 and CO2 18 mmol/L with a normal anion gap.  Total protein 6.0 and albumin 2.4 g/dL.  Glucose, renal function and the rest of the hepatic functions are unremarkable.  Imaging: Portable 1 view chest radiograph with no acute disease.  ED course: Initial  vital signs were temperature 99.1 F, pulse 156, respiration 18, BP 129/81 mmHg and O2 sat 100% on room air.  The patient received 1000 mL of LR bolus and was started on a diltiazem infusion after a 20 mg diltiazem bolus.  I added magnesium sulfate 2 g IVPB, K-Lor 40 mEq p.o. and metoprolol 5 mg IVP.  His heart rate is now in the 90s and regular.   Review of Systems: As mentioned in the history of present illness. All other systems reviewed and are negative. Past Medical History:  Diagnosis Date   Actinic keratosis 11/27/2016   Alcohol abuse with alcohol-induced mood disorder ([place]) 07/18/2018   Alcohol use disorder, severe, dependence ([place]) 09/16/2006   05/22/2015 Argumentative, belligerent and verbally abusive to staff per his note from [facility])    Anxiety state 04/25/2018   Aortic atherosclerosis ([place]) 08/27/2022   Chronic low back pain 09/16/2006   Followed by NS.  Per his chart from [place], he fell off two storyhouse roof while cleaning his brother's gutters and sustained compression fracture of L3, L4 and L5 and fracture of right transverse process at L3 and nondisplaced fracture of left glenoid rim many years ago. He had multiple imaging in [place] including Lumbar MRI in 2016 which showed moderate to severe spinal canal stenos   Delirium tremens ([place]) 09/01/2017   Depression    Dry eye 11/23/2016   Foreign body in middle portion of esophagus 05/19/2022   Hiatal hernia 09/14/2016   Status Collis gastroplasty and Nissen's fundoplication on [id number] in [place]   History of delirium tremons 07/22/2020   DTs during 10/2016 08/2017. And 12/2017 admissions    History of pulmonary embolus (  PE) 11/02/2016   Unprovoked 11/01/2016. Xarelto from 11/01/16 to 09/08/2017.   Hydrocele    Surgically corrected   Hypertension    Hypoalbuminemia due to protein-calorie malnutrition ([place]) 07/22/2020   Lumbar compression fracture (HCC)    Multiple rib fractures  07/22/2019   Left rib details XR: left ribs demonstrate multiple remote rib   Pancytopenia ([place])    Rhabdomyolysis 07/22/2020   Skin cancer    exciced 2017   Tinea versicolor 06/08/2013   Tobacco use disorder 07/22/2020   Tooth infection 04/25/2018   Trigger finger, acquired 11/18/2012   Ulnar tunnel syndrome of right wrist 11/08/2012   Past Surgical History:  Procedure Laterality Date   BIOPSY  05/21/2022   Procedure: BIOPSY;  Surgeon: [name], MD;  Location: [facility] ENDOSCOPY;  Service: Gastroenterology;;   CATARACT EXTRACTION     ESOPHAGOGASTRODUODENOSCOPY (EGD) WITH PROPOFOL N/A 05/21/2022   Procedure: ESOPHAGOGASTRODUODENOSCOPY (EGD) WITH PROPOFOL;  Surgeon: [name], MD;  Location: [place];  Service: Gastroenterology;  Laterality: N/A;   FOREIGN BODY REMOVAL N/A 05/21/2022   Procedure: FOREIGN BODY REMOVAL;  Surgeon: [name], MD;  Location: [place];  Service: Gastroenterology;  Laterality: N/A;   HIATAL HERNIA REPAIR  2016   HYDROCELE EXCISION / REPAIR  2010   SKIN CANCER EXCISION Left    Excised 2017   Social History:  reports that he has never smoked. His smokeless tobacco use includes snuff. He reports current alcohol use of about 43.0 standard drinks of alcohol per week. He reports current drug use. Drug: Marijuana.  Allergies  Allergen Reactions   Other Itching and Other (See Comments)    Seasonal allergies- Itchy eyes, runny nose, congestion   Poison Ivy Extract Rash    Family History  Problem Relation Age of Onset   Heart attack Father        died at 23 years   Dementia Father    Stroke Father    Cancer Sister    Colon cancer Neg Hx    Colon polyps Neg Hx    Esophageal cancer Neg Hx    Stomach cancer Neg Hx    Rectal cancer Neg Hx     Prior to Admission medications   Medication Sig Start Date End Date Taking? Authorizing Provider  acetaminophen (TYLENOL) 500 MG tablet Take 500-1,000 mg by mouth every 6 (six) hours as needed (for pain,  headaches, or dental pain).    [provider]  aspirin EC 325 MG tablet Take 325 mg by mouth every 6 (six) hours as needed (for pain, headaches, or dental pain).    [provider]  budesonide-formoterol (SYMBICORT) 160-4.5 MCG/ACT inhaler Inhale 2 puffs into the lungs 2 (two) times daily. 08/12/22 09/11/22  [provider]  folic acid (FOLVITE) 1 MG tablet Take 1 tablet (1 mg total) by mouth daily. 09/06/22   Amin, Jeanella Flattery, MD  loperamide (IMODIUM) 2 MG capsule Take 2 capsules (4 mg total) by mouth as needed for diarrhea or loose stools. 09/05/22   Amin, Jeanella Flattery, MD  loratadine (CLARITIN) 10 MG tablet TAKE 1 TABLET (10 MG TOTAL) BY MOUTH DAILY. Patient not taking: Reported on 07/23/2022 10/08/20 07/23/22  Lilland, Lorrin Goodell, DO  methocarbamol (ROBAXIN) 750 MG tablet Take 1 tablet (750 mg total) by mouth every 8 (eight) hours as needed for muscle spasms. 09/05/22   Amin, Jeanella Flattery, MD  midodrine (PROAMATINE) 10 MG tablet Take 1 tablet (10 mg total) by mouth 3 (three) times daily with meals. 09/05/22  Amin, Ankit Chirag, MD  mometasone-formoterol (DULERA) 100-5 MCG/ACT AERO INHALE 2 PUFFS INTO THE LUNGS TWO TIMES DAILY. 03/19/22 03/19/23  Sharion Settler, DO  nystatin (MYCOSTATIN/NYSTOP) powder Apply topically as needed (irritation). Patient not taking: Reported on 07/23/2022 06/02/22   Arlyce Dice, MD  pantoprazole (PROTONIX) 40 MG tablet Take 1 tablet (40 mg total) by mouth at bedtime. 09/05/22   Amin, Ankit Chirag, MD  psyllium (METAMUCIL) 0.52 g capsule Take 1 capsule (0.52 g total) by mouth daily. Patient not taking: Reported on 08/27/2022 07/24/22   Leanord Asal K, DO  rosuvastatin (CRESTOR) 10 MG tablet Take 1 tablet (10 mg total) by mouth daily. 09/05/22   Amin, Jeanella Flattery, MD  thiamine (VITAMIN B-1) 100 MG tablet Take 1 tablet (100 mg total) by mouth daily. 09/06/22   Damita Lack, MD    Physical Exam: Vitals:   09/11/22 0939 09/11/22 0941 09/11/22 1108   BP: 129/81  118/70  Pulse: (!) 156  (!) 115  Resp: 18  (!) 22  Temp: 99.1 F (37.3 C)    TempSrc: Oral    SpO2: 100%  99%  Weight:  61.2 kg   Height:  '5\' 8"'$  (1.727 m)    Physical Exam Vitals and nursing note reviewed.  Constitutional:      Appearance: Normal appearance.  HENT:     Head: Normocephalic.     Mouth/Throat:     Mouth: Mucous membranes are moist.  Eyes:     General: No scleral icterus.    Pupils: Pupils are equal, round, and reactive to light.  Cardiovascular:     Rate and Rhythm: Normal rate and regular rhythm.  Pulmonary:     Effort: Pulmonary effort is normal.     Breath sounds: Normal breath sounds.  Abdominal:     General: Bowel sounds are normal. There is no distension.     Palpations: Abdomen is soft.     Tenderness: There is no abdominal tenderness. There is no guarding.  Musculoskeletal:     Cervical back: Neck supple.     Right lower leg: 1+ Edema present.     Left lower leg: 1+ Edema present.  Skin:    General: Skin is warm and dry.  Neurological:     General: No focal deficit present.     Mental Status: He is alert and oriented to person, place, and time.  Psychiatric:        Mood and Affect: Mood normal.        Behavior: Behavior normal.   Data Reviewed:  Results are pending, will review when available.  08/28/2022 echocardiogram: IMPRESSIONS:   1. Left ventricular ejection fraction, by estimation, is 60 to 65%. The  left ventricle has normal function. The left ventricle has no regional  wall motion abnormalities. Left ventricular diastolic parameters are  indeterminate.   2. Right ventricular systolic function is normal. The right ventricular  size is mildly enlarged. Tricuspid regurgitation signal is inadequate for  assessing PA pressure.   3. The mitral valve is normal in structure. No evidence of mitral valve  regurgitation. No evidence of mitral stenosis.   4. The aortic valve was not well visualized. Aortic valve regurgitation   is not visualized. No aortic stenosis is present.   Assessment and Plan: Principal Problem:   Atrial fibrillation with RVR ([place]) With superimposed   Acute on chronic diastolic CHF (congestive heart failure) ([place]) Observation/PCU. Not on anticoagulation. Supplemental oxygen as needed. Continue diltiazem infusion. Will resume low-dose beta-blocker.  Keep electrolytes optimized. Sodium and fluid restriction. Continue furosemide 20 mg IVP daily. Monitor daily weights, intake and output. Monitor renal function electrolytes. Check echocardiogram.  Active Problems:   Hypokalemia Replacing. Magnesium was supplemented. Follow potassium level. Supplement as needed.    Hyponatremia In the setting of acute CHF. Continue treatment as above. Follow-up sodium level.    Gastritis and gastroduodenitis Continue pantoprazole 40 mg p.o. at bedtime.    Chronic diarrhea C. difficile testing still pending. Check GI pathogen by PCR. Resume/increase psyllium 1 capsule p.o. 3 times daily.    Tobacco use disorder Occasional smokeless tobacco use.    Alcohol abuse In remission according to the patient.    HTN (hypertension) Antihypertensives recently held. May need low-dose negative chronotropic. Resume midodrine 10 mg p.o. 3 times daily as needed.    Macrocytic anemia Anemia panel in August revealed iron deficiency. Continue [id number], folic acid and thiamine supplementation. Monitor hematocrit and hemoglobin. Transfuse PRBC as needed.    Aortic atherosclerosis (HCC)   Dyslipidemia Continue rosuvastatin 10 mg p.o. daily.    Malnutrition of moderate degree   Moderate protein malnutrition (HCC) Protein supplementation. Consider nutritional services evaluation.    Advance Care Planning:   Code Status: Full Code   Consults:   Family Communication:   Severity of Illness: The appropriate patient status for this patient is OBSERVATION. Observation status is judged to be reasonable  and necessary in order to provide the required intensity of service to ensure the patient's safety. The patient's presenting symptoms, physical exam findings, and initial radiographic and laboratory data in the context of their medical condition is felt to place them at decreased risk for further clinical deterioration. Furthermore, it is anticipated that the patient will be medically stable for discharge from the hospital within 2 midnights of admission.   Author: Reubin Milan, MD 09/11/2022 11:47 AM  For on call review [email or web address].   This document was prepared using Dragon voice recognition software and may contain some unintended transcription errors.

## 2022-09-11 NOTE — TOC Initial Note (Addendum)
Transition of Care [facility]) - Initial/Assessment Note    Patient Details  Name: Anthony [name] MRN: [id number] Date of Birth: 06/08/1956  [facility]) CM/SW Contact:    Dessa Phi, RN Phone Number: 09/11/2022, 3:00 PM  Clinical Narrative: Patient deferred me to talk to his friend Anthony [name] [id number] [id number] [id number] will stay with her @ d/c-she will pick him up @ d/c;ordered 3n1 [employer] to deliver to rm prior d/c. Address @ d/c:[address], [place].Informed Matesha that hospital doesn't carry depends-she understands to get those @ a pharmacy or a general store. Monitor for d/c plans.                  Expected Discharge Plan: Home/Self Care Barriers to Discharge: Continued Medical Work up   Patient Goals and CMS Choice Patient states their goals for this hospitalization and ongoing recovery are::  (Home) CMS Medicare.gov Compare Post Acute Care list provided to:: Patient Choice offered to / list presented to : Patient [place] ownership interest in [facility].provided to:: Patient    Expected Discharge Plan and Services   Discharge Planning Services: CM Consult Post Acute Care Choice: Durable Medical Equipment (3n1) Living arrangements for the past 2 months: Homeless Shelter                 DME Arranged: 3-N-1 DME Agency: [employer] Date DME Agency Contacted: 09/11/22 Time DME Agency Contacted: 1500 Representative spoke with at DME Agency: [email or web address]            Prior Living Arrangements/Services Living arrangements for the past 2 months: [place] with:: Roommate                   Activities of Daily Living Home Assistive Devices/Equipment: None ADL Screening (condition at time of admission) Patient's cognitive ability adequate to safely complete daily activities?: Yes Is the patient deaf or have difficulty hearing?: No Does the patient have difficulty seeing, even when wearing glasses/contacts?: No Does the  patient have difficulty concentrating, remembering, or making decisions?: No Patient able to express need for assistance with ADLs?: Yes Does the patient have difficulty dressing or bathing?: No Independently performs ADLs?: Yes (appropriate for developmental age) Does the patient have difficulty walking or climbing stairs?: Yes Weakness of Legs: Both Weakness of Arms/Hands: None  Permission Sought/Granted                  Emotional Assessment              Admission diagnosis:  Atrial fibrillation with RVR ([place]) [I48.91] Patient Active Problem List   Diagnosis Date Noted   Moderate protein malnutrition ([place]) 09/11/2022   Aspiration pneumonia ([place]) 08/27/2022   Aortic atherosclerosis ([place]) 08/27/2022   Depression, unspecified 08/09/2022   Hyponatremia 06/01/2022   Non compliance w medication regimen 05/31/2022   Delirium 05/25/2022   Stricture and stenosis of esophagus 05/21/2022   Gastritis and gastroduodenitis 05/21/2022   Loose stools 05/20/2022   Malnutrition of moderate degree 05/20/2022   Acute on chronic congestive heart failure ([place])    Leukocytosis 05/19/2022   Sepsis ([place]) 05/19/2022   Cellulitis of left leg, possible 05/19/2022    Class: Question of   Moniliasis, interdigital 05/19/2022   Open toe wound, right foot, medial fourth digit 05/19/2022   Malnutrition ([place]) 05/19/2022   Alcohol abuse 03/24/2022   Dyslipidemia 03/24/2022   Acute on chronic diastolic CHF (congestive heart failure) ([place]) 03/24/2022   Acute respiratory  failure with hypoxia ([place]) 03/23/2022   Generalized weakness    Atrial fibrillation with RVR ([place]) 03/15/2022   Hypokalemia 03/15/2022   Iron deficiency anemia 10/16/2020   Cough    Hypomagnesemia    Protein-calorie malnutrition, severe 08/22/2020   Alcohol use disorder, severe, in controlled environment ([place])    Pressure injury of skin 08/18/2020   Urinary tract infection without hematuria    Severe sepsis ([place]) 08/14/2020    Paroxysmal atrial fibrillation ([place]) 08/13/2020   Rhabdomyolysis 07/22/2020   Cerebral ventriculomegaly 07/22/2020   Hemoglobin decreased 07/22/2020   History of delirium tremons 07/22/2020   Hypoalbuminemia due to protein-calorie malnutrition ([place]) 07/22/2020   Tobacco use disorder 07/22/2020   Alcohol withdrawal delirium ([place])    Dehydration    Alcohol abuse with alcohol-induced mood disorder ([place]) 07/18/2018   Depression 05/26/2018   Anxiety state 04/25/2018   Need for immunization against influenza 04/25/2018   Alcohol withdrawal ([place]) 09/01/2017   DOE (dyspnea on exertion) 11/27/2016   Actinic keratosis 11/27/2016   Macrocytic anemia 11/23/2016   History of pulmonary embolus (PE) 11/02/2016   Hiatal hernia 09/14/2016   Skin cancer 08/13/2016   Poor social situation 06/09/2013   Tinea versicolor 06/08/2013   Back pain 05/07/2013   Seborrheic dermatitis of scalp 11/08/2012   HTN (hypertension) 04/11/2012   Alcohol use disorder, severe, dependence ([place]) 09/16/2006   PCP:  Jacelyn Grip, MD Pharmacy:   [facility] [place], [place] [address] [place] [address] Phone: [phone] Fax: [phone]     Social Determinants of Health (SDOH) Social History: SDOH Screenings   Food Insecurity: Food Insecurity Present (09/11/2022)  Housing: High Risk (09/11/2022)  Transportation Needs: Unmet Transportation Needs (09/11/2022)  Utilities: Not At Risk (09/11/2022)  Depression (PHQ2-9): Medium Risk (10/16/2020)  Tobacco Use: High Risk (09/11/2022)   SDOH Interventions:     Readmission Risk Interventions    08/28/2022    2:00 PM 08/27/2022   10:30 AM 08/27/2022   10:08 AM  Readmission Risk Prevention Plan  Transportation Screening Complete Complete   Medication Review ([place])  Complete   PCP or Specialist appointment within 3-5 days of discharge Complete Complete Complete  HRI or  Home Care Consult  Complete Complete  SW Recovery Care/Counseling Consult Complete Complete Complete  Palliative Care Screening Not Applicable Not Applicable Complete  Skilled Nursing Facility Not Applicable Not Applicable Not Applicable

## 2022-09-12 DIAGNOSIS — I10 Essential (primary) hypertension: Secondary | ICD-10-CM | POA: Diagnosis not present

## 2022-09-12 DIAGNOSIS — I4891 Unspecified atrial fibrillation: Secondary | ICD-10-CM | POA: Diagnosis present

## 2022-09-12 DIAGNOSIS — D539 Nutritional anemia, unspecified: Secondary | ICD-10-CM

## 2022-09-12 DIAGNOSIS — K529 Noninfective gastroenteritis and colitis, unspecified: Secondary | ICD-10-CM

## 2022-09-12 DIAGNOSIS — E440 Moderate protein-calorie malnutrition: Secondary | ICD-10-CM

## 2022-09-12 DIAGNOSIS — E871 Hypo-osmolality and hyponatremia: Secondary | ICD-10-CM | POA: Diagnosis present

## 2022-09-12 DIAGNOSIS — M545 Low back pain, unspecified: Secondary | ICD-10-CM | POA: Diagnosis present

## 2022-09-12 DIAGNOSIS — Z79899 Other long term (current) drug therapy: Secondary | ICD-10-CM | POA: Diagnosis not present

## 2022-09-12 DIAGNOSIS — Z86711 Personal history of pulmonary embolism: Secondary | ICD-10-CM | POA: Diagnosis not present

## 2022-09-12 DIAGNOSIS — E876 Hypokalemia: Secondary | ICD-10-CM | POA: Diagnosis present

## 2022-09-12 DIAGNOSIS — Z6820 Body mass index (BMI) 20.0-20.9, adult: Secondary | ICD-10-CM | POA: Diagnosis not present

## 2022-09-12 DIAGNOSIS — G8929 Other chronic pain: Secondary | ICD-10-CM | POA: Diagnosis present

## 2022-09-12 DIAGNOSIS — I5033 Acute on chronic diastolic (congestive) heart failure: Secondary | ICD-10-CM

## 2022-09-12 DIAGNOSIS — D638 Anemia in other chronic diseases classified elsewhere: Secondary | ICD-10-CM | POA: Diagnosis present

## 2022-09-12 DIAGNOSIS — D509 Iron deficiency anemia, unspecified: Secondary | ICD-10-CM | POA: Diagnosis present

## 2022-09-12 DIAGNOSIS — I480 Paroxysmal atrial fibrillation: Secondary | ICD-10-CM | POA: Diagnosis present

## 2022-09-12 DIAGNOSIS — Z7982 Long term (current) use of aspirin: Secondary | ICD-10-CM | POA: Diagnosis not present

## 2022-09-12 DIAGNOSIS — F1010 Alcohol abuse, uncomplicated: Secondary | ICD-10-CM

## 2022-09-12 DIAGNOSIS — I700 Atherosclerosis of aorta: Secondary | ICD-10-CM | POA: Diagnosis present

## 2022-09-12 DIAGNOSIS — A0472 Enterocolitis due to Clostridium difficile, not specified as recurrent: Secondary | ICD-10-CM | POA: Diagnosis not present

## 2022-09-12 DIAGNOSIS — Z8249 Family history of ischemic heart disease and other diseases of the circulatory system: Secondary | ICD-10-CM | POA: Diagnosis not present

## 2022-09-12 DIAGNOSIS — Z85828 Personal history of other malignant neoplasm of skin: Secondary | ICD-10-CM | POA: Diagnosis not present

## 2022-09-12 DIAGNOSIS — I2510 Atherosclerotic heart disease of native coronary artery without angina pectoris: Secondary | ICD-10-CM | POA: Diagnosis present

## 2022-09-12 DIAGNOSIS — R197 Diarrhea, unspecified: Secondary | ICD-10-CM | POA: Diagnosis present

## 2022-09-12 DIAGNOSIS — E785 Hyperlipidemia, unspecified: Secondary | ICD-10-CM | POA: Diagnosis present

## 2022-09-12 DIAGNOSIS — Z7951 Long term (current) use of inhaled steroids: Secondary | ICD-10-CM | POA: Diagnosis not present

## 2022-09-12 DIAGNOSIS — I110 Hypertensive heart disease with heart failure: Secondary | ICD-10-CM | POA: Diagnosis present

## 2022-09-12 DIAGNOSIS — F17200 Nicotine dependence, unspecified, uncomplicated: Secondary | ICD-10-CM | POA: Diagnosis not present

## 2022-09-12 DIAGNOSIS — F419 Anxiety disorder, unspecified: Secondary | ICD-10-CM | POA: Diagnosis present

## 2022-09-12 DIAGNOSIS — Z5901 Sheltered homelessness: Secondary | ICD-10-CM | POA: Diagnosis not present

## 2022-09-12 LAB — CBC WITH DIFFERENTIAL/PLATELET
Abs Immature Granulocytes: 0.09 10*3/uL — ABNORMAL HIGH (ref 0.00–0.07)
Basophils Absolute: 0.1 10*3/uL (ref 0.0–0.1)
Basophils Relative: 1 %
Eosinophils Absolute: 0.1 10*3/uL (ref 0.0–0.5)
Eosinophils Relative: 1 %
HCT: 27 % — ABNORMAL LOW (ref 39.0–52.0)
Hemoglobin: 8.8 g/dL — ABNORMAL LOW (ref 13.0–17.0)
Immature Granulocytes: 1 %
Lymphocytes Relative: 12 %
Lymphs Abs: 1.5 10*3/uL (ref 0.7–4.0)
MCH: 35.2 pg — ABNORMAL HIGH (ref 26.0–34.0)
MCHC: 32.6 g/dL (ref 30.0–36.0)
MCV: 108 fL — ABNORMAL HIGH (ref 80.0–100.0)
Monocytes Absolute: 0.8 10*3/uL (ref 0.1–1.0)
Monocytes Relative: 6 %
Neutro Abs: 10.4 10*3/uL — ABNORMAL HIGH (ref 1.7–7.7)
Neutrophils Relative %: 79 %
Platelets: 462 10*3/uL — ABNORMAL HIGH (ref 150–400)
RBC: 2.5 MIL/uL — ABNORMAL LOW (ref 4.22–5.81)
RDW: 15.2 % (ref 11.5–15.5)
WBC: 13 10*3/uL — ABNORMAL HIGH (ref 4.0–10.5)
nRBC: 0 % (ref 0.0–0.2)

## 2022-09-12 LAB — RETICULOCYTES
Immature Retic Fract: 21.9 % — ABNORMAL HIGH (ref 2.3–15.9)
RBC.: 2.5 MIL/uL — ABNORMAL LOW (ref 4.22–5.81)
Retic Count, Absolute: 52.5 10*3/uL (ref 19.0–186.0)
Retic Ct Pct: 2.1 % (ref 0.4–3.1)

## 2022-09-12 LAB — GASTROINTESTINAL PANEL BY PCR, STOOL (REPLACES STOOL CULTURE)

## 2022-09-12 LAB — IRON AND TIBC
Iron: 11 ug/dL — ABNORMAL LOW (ref 45–182)
Saturation Ratios: 5 % — ABNORMAL LOW (ref 17.9–39.5)
TIBC: 224 ug/dL — ABNORMAL LOW (ref 250–450)
UIBC: 213 ug/dL

## 2022-09-12 LAB — BASIC METABOLIC PANEL
Anion gap: 6 (ref 5–15)
BUN: 9 mg/dL (ref 8–23)
CO2: 20 mmol/L — ABNORMAL LOW (ref 22–32)
Calcium: 7.2 mg/dL — ABNORMAL LOW (ref 8.9–10.3)
Chloride: 106 mmol/L (ref 98–111)
Creatinine, Ser: 0.65 mg/dL (ref 0.61–1.24)
GFR, Estimated: >60 mL/min (ref >60)
Glucose, Bld: 111 mg/dL — ABNORMAL HIGH (ref 70–99)
Potassium: 2.8 mmol/L — ABNORMAL LOW (ref 3.5–5.1)
Sodium: 132 mmol/L — ABNORMAL LOW (ref 135–145)

## 2022-09-12 LAB — FERRITIN: Ferritin: 53 ng/mL (ref 24–336)

## 2022-09-12 LAB — VITAMIN B12: Vitamin B-12: 474 pg/mL (ref 180–914)

## 2022-09-12 LAB — FOLATE: Folate: 13.3 ng/mL (ref >5.9)

## 2022-09-12 MED ORDER — FAMOTIDINE 20 MG PO TABS
20.0000 mg | ORAL_TABLET | Freq: Every day | ORAL | Status: DC
  Administered 2022-09-12 – 2022-09-17 (×6): 20 mg via ORAL
  Filled 2022-09-12 (×6): qty 1

## 2022-09-12 MED ORDER — POTASSIUM CHLORIDE CRYS ER 20 MEQ PO TBCR
40.0000 meq | EXTENDED_RELEASE_TABLET | Freq: Two times a day (BID) | ORAL | Status: DC
  Administered 2022-09-12 – 2022-09-13 (×4): 40 meq via ORAL
  Filled 2022-09-12 (×4): qty 2

## 2022-09-12 MED ORDER — ALUM & MAG HYDROXIDE-SIMETH 200-200-20 MG/5ML PO SUSP
30.0000 mL | Freq: Four times a day (QID) | ORAL | Status: DC | PRN
  Administered 2022-09-12: 30 mL via ORAL
  Filled 2022-09-12: qty 30

## 2022-09-12 MED ORDER — MIDODRINE HCL 5 MG PO TABS
10.0000 mg | ORAL_TABLET | Freq: Three times a day (TID) | ORAL | Status: DC
  Administered 2022-09-12 – 2022-09-17 (×15): 10 mg via ORAL
  Filled 2022-09-12 (×14): qty 2

## 2022-09-12 MED ORDER — DILTIAZEM HCL 30 MG PO TABS
30.0000 mg | ORAL_TABLET | Freq: Four times a day (QID) | ORAL | Status: DC
  Administered 2022-09-12 – 2022-09-13 (×4): 30 mg via ORAL
  Filled 2022-09-12 (×4): qty 1

## 2022-09-12 MED ORDER — RIVAROXABAN 20 MG PO TABS
20.0000 mg | ORAL_TABLET | Freq: Every day | ORAL | Status: DC
  Administered 2022-09-12 – 2022-09-16 (×5): 20 mg via ORAL
  Filled 2022-09-12 (×5): qty 1

## 2022-09-12 MED ORDER — POTASSIUM CHLORIDE 10 MEQ/100ML IV SOLN
10.0000 meq | INTRAVENOUS | Status: AC
  Administered 2022-09-12 (×2): 10 meq via INTRAVENOUS
  Filled 2022-09-12 (×2): qty 100

## 2022-09-12 MED ORDER — MAGNESIUM SULFATE 2 GM/50ML IV SOLN
2.0000 g | Freq: Once | INTRAVENOUS | Status: AC
  Administered 2022-09-12: 2 g via INTRAVENOUS
  Filled 2022-09-12: qty 50

## 2022-09-12 NOTE — Progress Notes (Signed)
   09/12/22 1145  Assess: MEWS Score  Temp 98.7 F (37.1 C)  BP 98/65  MAP (mmHg) 79  Pulse Rate (!) 108  Resp 18  SpO2 98 %  O2 Device Room Air  Assess: MEWS Score  MEWS Temp 0  MEWS Systolic 1  MEWS Pulse 1  MEWS RR 0  MEWS LOC 0  MEWS Score 2  MEWS Score Color Yellow  Assess: if the MEWS score is Yellow or Red  Were vital signs taken at a resting state? Yes  Focused Assessment Change from prior assessment (see assessment flowsheet)  Does the patient meet 2 or more of the SIRS criteria? No  MEWS guidelines implemented  Yes, yellow  Treat  MEWS Interventions Considered administering scheduled or prn medications/treatments as ordered  Take Vital Signs  Increase Vital Sign Frequency  Yellow: Q2hr x1, continue Q4hrs until patient remains green for 12hrs  Escalate  MEWS: Escalate Yellow: Discuss with charge nurse and consider notifying provider and/or RRT  Notify: Charge Nurse/RN  Name of Charge Nurse/RN Notified Dealer  Assess: SIRS CRITERIA  SIRS Temperature  0  SIRS Pulse 1  SIRS Respirations  0  SIRS WBC 0  SIRS Score Sum  1

## 2022-09-12 NOTE — Progress Notes (Signed)
[facility]                                                                               Anthony [name], is a 67 y.o. male, DOB - 1955/11/27, [id number] Admit date - 09/11/2022    Outpatient Primary MD for the patient is Jacelyn Grip, MD  LOS - 0  days    Brief summary    Anthony [name] is a 67 y.o. 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  He was found to be in atrial fibrillation with RVR.   Assessment & Plan    Assessment and Plan:    Mild acute on chronic diastolic CHF:  Appears to have resolved.  Sob improved.  Diuresed well with IV lasix 20 mg daily.  BNP is 484.  Troponins are negative.  EKG Ordered.     Atrial fibrillation with RVR.  Rate better controlled with cardizem gtt.  Transitioned to oral cardizem 30 mg every 6 hours.  His CHA2D2 score is 2, started him on eliquis for anti coagulation.  TSH WNL.  EKG repeat today.    C diff colitis:  On oral vancomycin.  Advanced diet    Hypokalemia and hypomagnesemia:  Replaced.    Chronic low back pain  Therapy evaluations ordered.     Anxiety and depression:     Pulmonary Embolism:  Completed Xarelto in 2019.     Hypertension: BP parameters are well controlled.    Tobacco Use disorder:   Alcohol Use disorder:  No signs of withdrawal.   Hyperlipidemia:  Continue with Crestor.     Hyponatremia:  ? From fluid overload.    Macrocytic anemia/ anemia of chronic disease Baseline hemoglobin around 8.  Check anemia panel and transfuse to keep hemoglobin greater than 7.    Estimated body mass index is 20.33 kg/m as calculated from the following:   Height as of this encounter: '5\' 8"'$  (1.727 m).   Weight as of this encounter: 60.6 kg.  Code Status: full code.  DVT Prophylaxis:     Level of Care: Level of care: Progressive Family Communication: none at bedside.   Disposition Plan:     Remains inpatient appropriate:  pending further improvement.   Procedures:  None.   Consultants:   None.  Antimicrobials:   Anti-infectives (From admission, onward)    Start     Dose/Rate Route Frequency Ordered Stop   09/11/22 1800  vancomycin (VANCOCIN) capsule 125 mg        125 mg Oral 4 times daily 09/11/22 1547 09/21/22 1759        Medications  Scheduled Meds:  vitamin B-12  1,000 mcg Oral Daily   diltiazem  30 mg Oral  [id number]   folic acid  1 mg Oral Daily   midodrine  10 mg Oral TID WC   mometasone-formoterol  2 puff Inhalation BID   potassium chloride  40 mEq Oral BID   rosuvastatin  10 mg Oral Daily   thiamine  100 mg Oral Daily   vancomycin  125 mg Oral QID   Continuous Infusions:  magnesium sulfate bolus IVPB     potassium chloride     PRN Meds:.acetaminophen **OR** acetaminophen, methocarbamol, prochlorperazine    Subjective:   Anthony [name] was seen and examined today.  Reports sob and some abd discomfort. Passing flatus   Objective:   Vitals:   09/12/22 0524 09/12/22 0747 09/12/22 1145 09/12/22 1415  BP: 108/82  98/65 95/71  Pulse: (!) 101  (!) 108 (!) 117  Resp: '19  18 16  '$ Temp: 98.9 F (37.2 C)  98.7 F (37.1 C) 99.1 F (37.3 C)  TempSrc:   Oral Oral  SpO2: 98% 97% 98% 100%  Weight:      Height:        Intake/Output Summary (Last 24 hours) at 09/12/2022 1547 Last data filed at 09/12/2022 1100 Gross per 24 hour  Intake 351.29 ml   Output 1850 ml  Net -1498.71 ml   Filed Weights   09/11/22 0941 09/12/22 0500  Weight: 61.2 kg 60.6 kg     Exam General exam: Appears calm and comfortable  Respiratory system: Clear to auscultation. Respiratory effort normal. Cardiovascular system: S1 & S2 heard, irregularly irregular. No JVD, pedal edema improving.  Gastrointestinal system: Abdomen is soft, mildly tender, bs+ Central nervous system: Alert and oriented. No focal neurological deficits. Extremities: Symmetric 5 x 5 power. Skin: No rashes, lesions or ulcers Psychiatry:  Mood & affect appropriate.    Data Reviewed:  I have personally reviewed following labs and imaging studies   CBC Lab Results  Component Value Date   WBC 13.0 (H) 09/12/2022   RBC 2.50 (L) 09/12/2022   HGB 8.8 (L) 09/12/2022   HCT 27.0 (L) 09/12/2022   MCV 108.0 (H) 09/12/2022   MCH 35.2 (H) 09/12/2022   PLT 462 (H) 09/12/2022   MCHC 32.6 09/12/2022   RDW 15.2 09/12/2022   LYMPHSABS 1.5 09/12/2022   MONOABS 0.8 09/12/2022   EOSABS 0.1 09/12/2022   BASOSABS 0.1 [id number]     Last metabolic panel Lab Results  Component Value Date   NA 132 (L) 09/12/2022   K 2.8 (L) 09/12/2022   CL 106 09/12/2022   CO2 20 (L) 09/12/2022   BUN 9 09/12/2022   CREATININE 0.65 09/12/2022   GLUCOSE 111 (H) 09/12/2022   GFRNONAA >60 09/12/2022   GFRAA >60 07/20/2018   CALCIUM 7.2 (L) 09/12/2022   PHOS 3.3 09/11/2022   PROT 6.0 (L) 09/11/2022   ALBUMIN 2.4 (L) 09/11/2022   LABGLOB 2.7 11/27/2016   AGRATIO 1.5 11/27/2016   BILITOT 0.6 09/11/2022   ALKPHOS 77 09/11/2022   AST 21 09/11/2022   ALT 13 09/11/2022   ANIONGAP 6 09/12/2022    CBG (last 3)  No results for input(s): "GLUCAP" in the last 72 hours.    Coagulation Profile: No results for input(s): "INR", "PROTIME" in the last 168 hours.   Radiology Studies: DG Chest Port 1 View  Result Date: 09/11/2022 CLINICAL DATA:  Shortness of breath.  Diarrhea and weakness. EXAM: PORTABLE  CHEST 1 VIEW COMPARISON:  Single-view of the chest 08/27/2022. PA and lateral chest 07/15/2022. CT chest 08/27/2022 FINDINGS: There is basilar  atelectasis on the left. The lungs are otherwise clear. Heart size is upper normal. Aortic atherosclerosis noted. No pneumothorax or pleural fluid. No acute bony abnormality. IMPRESSION: No acute disease. Electronically Signed   By: Inge Rise M.D.   On: 09/11/2022 11:13       Hosie Poisson M.D. [facility] 09/12/2022, 3:47 PM  Available via Epic secure chat 7am-7pm After 7 pm, please refer to night coverage provider listed on amion.

## 2022-09-13 DIAGNOSIS — A0472 Enterocolitis due to Clostridium difficile, not specified as recurrent: Secondary | ICD-10-CM | POA: Diagnosis not present

## 2022-09-13 DIAGNOSIS — I4891 Unspecified atrial fibrillation: Secondary | ICD-10-CM | POA: Diagnosis not present

## 2022-09-13 DIAGNOSIS — I10 Essential (primary) hypertension: Secondary | ICD-10-CM | POA: Diagnosis not present

## 2022-09-13 DIAGNOSIS — D539 Nutritional anemia, unspecified: Secondary | ICD-10-CM | POA: Diagnosis not present

## 2022-09-13 LAB — CBC WITH DIFFERENTIAL/PLATELET
Abs Immature Granulocytes: 0.07 10*3/uL (ref 0.00–0.07)
Basophils Absolute: 0.1 10*3/uL (ref 0.0–0.1)
Basophils Relative: 1 %
Eosinophils Absolute: 0.1 10*3/uL (ref 0.0–0.5)
Eosinophils Relative: 1 %
HCT: 30.4 % — ABNORMAL LOW (ref 39.0–52.0)
Hemoglobin: 9.5 g/dL — ABNORMAL LOW (ref 13.0–17.0)
Immature Granulocytes: 1 %
Lymphocytes Relative: 14 %
Lymphs Abs: 1.5 10*3/uL (ref 0.7–4.0)
MCH: 34.7 pg — ABNORMAL HIGH (ref 26.0–34.0)
MCHC: 31.3 g/dL (ref 30.0–36.0)
MCV: 110.9 fL — ABNORMAL HIGH (ref 80.0–100.0)
Monocytes Absolute: 0.7 10*3/uL (ref 0.1–1.0)
Monocytes Relative: 7 %
Neutro Abs: 8.1 10*3/uL — ABNORMAL HIGH (ref 1.7–7.7)
Neutrophils Relative %: 76 %
Platelets: 441 10*3/uL — ABNORMAL HIGH (ref 150–400)
RBC: 2.74 MIL/uL — ABNORMAL LOW (ref 4.22–5.81)
RDW: 15.2 % (ref 11.5–15.5)
WBC: 10.6 10*3/uL — ABNORMAL HIGH (ref 4.0–10.5)
nRBC: 0 % (ref 0.0–0.2)

## 2022-09-13 LAB — BASIC METABOLIC PANEL
Anion gap: 8 (ref 5–15)
BUN: 9 mg/dL (ref 8–23)
CO2: 19 mmol/L — ABNORMAL LOW (ref 22–32)
Calcium: 7.6 mg/dL — ABNORMAL LOW (ref 8.9–10.3)
Chloride: 107 mmol/L (ref 98–111)
Creatinine, Ser: 0.47 mg/dL — ABNORMAL LOW (ref 0.61–1.24)
GFR, Estimated: >60 mL/min (ref >60)
Glucose, Bld: 97 mg/dL (ref 70–99)
Potassium: 3.5 mmol/L (ref 3.5–5.1)
Sodium: 134 mmol/L — ABNORMAL LOW (ref 135–145)

## 2022-09-13 LAB — PHOSPHORUS: Phosphorus: 3.5 mg/dL (ref 2.5–4.6)

## 2022-09-13 LAB — MAGNESIUM: Magnesium: 1.6 mg/dL — ABNORMAL LOW (ref 1.7–2.4)

## 2022-09-13 MED ORDER — MELATONIN 3 MG PO TABS
6.0000 mg | ORAL_TABLET | Freq: Once | ORAL | Status: AC
  Administered 2022-09-13: 6 mg via ORAL
  Filled 2022-09-13: qty 2

## 2022-09-13 MED ORDER — ADULT MULTIVITAMIN W/MINERALS CH
1.0000 | ORAL_TABLET | Freq: Every day | ORAL | Status: DC
  Administered 2022-09-13 – 2022-09-17 (×5): 1 via ORAL
  Filled 2022-09-13 (×5): qty 1

## 2022-09-13 MED ORDER — ENSURE ENLIVE PO LIQD
237.0000 mL | Freq: Two times a day (BID) | ORAL | Status: DC
  Administered 2022-09-13 – 2022-09-17 (×7): 237 mL via ORAL

## 2022-09-13 MED ORDER — DILTIAZEM HCL ER COATED BEADS 120 MG PO CP24
120.0000 mg | ORAL_CAPSULE | Freq: Every day | ORAL | Status: DC
  Administered 2022-09-13 – 2022-09-14 (×2): 120 mg via ORAL
  Filled 2022-09-13 (×2): qty 1

## 2022-09-13 MED ORDER — MAGNESIUM SULFATE 2 GM/50ML IV SOLN
2.0000 g | Freq: Once | INTRAVENOUS | Status: AC
  Administered 2022-09-13: 2 g via INTRAVENOUS
  Filled 2022-09-13: qty 50

## 2022-09-13 NOTE — Progress Notes (Signed)
Initial Nutrition Assessment  DOCUMENTATION CODES:   Not applicable  INTERVENTION:   -MVI with minerals daily -Ensure Enlive po BID, each supplement provides 350 kcal and 20 grams of protein -Downgrade diet to dysphagia 3 (advanced mechanical soft) for ease of intake  NUTRITION DIAGNOSIS:   Increased nutrient needs related to chronic illness (CHF) as evidenced by estimated needs.  GOAL:   Patient will meet greater than or equal to 90% of their needs  MONITOR:   PO intake, Supplement acceptance  REASON FOR ASSESSMENT:   Consult Assessment of nutrition requirement/status  ASSESSMENT:   Pt with medical history significant of  actinic keratosis, alcohol abuse, alcohol withdrawal, anxiety, depression, dry eyes, midesophagus foreign body, hiatal hernia, pulmonary embolism, surgically corrected hydrocephaly, hypertension, hypokalemia, lumbar compression fracture, left rib fractures, pancytopenia, rhabdomyolysis, skin cancer, tinea versicolor, tobacco use disorder, dental infection, 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  He was found to be in atrial fibrillation with RVR.  Pt admitted with CHF and a-fib.   Reviewed I/O's: -1.4 L x 24 hours   Pt unavailable at time of visit. Attempted to speak with pt via call to hospital room phone, however, unable to reach. RD unable to obtain further nutrition-related history or complete nutrition-focused physical exam at this time.   Pt with good oral intake. Noted meal completions 95-100%. Noted pt with history of poor dentition and may benefit from mechanically altered diet.   Pt familiar to this RD due to prior admissions. Pt has been identified with moderate and severe malnutrition in the past and suspect this diagnosis is ongoing. Pt would greatly benefit from addition of oral nutrition supplements.  Reviewed wt hx; pt has  experienced a 14.7% wt loss over the past 6 months. Pt also with mild edema, which may be masking further weight loss as well as fat and muscle depletions.   Medications reviewed and include vitamin 0000000, cardizem, folic acid, potassium chloride, and  vitamin B-1.   Lab Results  Component Value Date   HGBA1C 5.2 11/01/2016   PTA DM medications are none.   Labs reviewed: Na: 134, Mg: 1.6, CBGS: 89 (inpatient orders for glycemic control are none).    Diet Order:   Diet Order             Diet Heart Room service appropriate? Yes; Fluid consistency: Thin  Diet effective now                   EDUCATION NEEDS:   No education needs have been identified at this time  Skin:  Skin Assessment: Reviewed RN Assessment  Last BM:  09/12/22  Height:   Ht Readings from Last 1 Encounters:  09/11/22 '5\' 8"'$  (1.727 m)    Weight:   Wt Readings from Last 1 Encounters:  09/13/22 61.1 kg    Ideal Body Weight:  70 kg  BMI:  Body mass index is 20.5 kg/m.  Estimated Nutritional Needs:   Kcal:  1950-2150  Protein:  90-105 grams  Fluid:  >1.9 L    Loistine Chance, RD, LDN, [place] Registered Dietitian II Certified Diabetes Care and Education Specialist Please refer to [facility] for RD and/or RD on-call/weekend/after hours pager

## 2022-09-13 NOTE — Plan of Care (Signed)
  Problem: Education: Goal: Knowledge of General Education information will improve Description: Including pain rating scale, medication(s)/side effects and non-pharmacologic comfort measures Outcome: Progressing   Problem: Clinical Measurements: Goal: Diagnostic test results will improve Outcome: Progressing   Problem: Activity: Goal: Risk for activity intolerance will decrease Outcome: Progressing   Problem: Nutrition: Goal: Adequate nutrition will be maintained Outcome: Progressing   Problem: Pain Managment: Goal: General experience of comfort will improve Outcome: Progressing   Problem: Safety: Goal: Ability to remain free from injury will improve Outcome: Progressing

## 2022-09-13 NOTE — Progress Notes (Signed)
OT Cancellation Note  Patient Details Name: DORRIEN OBIE MRN: [id number] DOB: 05-18-1956   Cancelled Treatment:    Reason Eval/Treat Not Completed: Fatigue/lethargy limiting ability to participate. Patient reporting busy all morning and too tired to participate. Will f/u as able.  Yevonne Yokum L Kadance Mccuistion 09/13/2022, 10:47 AM

## 2022-09-13 NOTE — Progress Notes (Signed)
[facility]                                                                               Shain Mosinee, is a 67 y.o. male, DOB - 1955/10/09, PT:[id number] Admit date - 09/11/2022    Outpatient Primary MD for the patient is Jacelyn Grip, MD  LOS - 1  days    Brief summary    Anthony [name] is a 67 y.o. 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  He was found to be in atrial fibrillation with RVR.   Assessment & Plan    Assessment and Plan:    Mild acute on chronic diastolic CHF:  Appears to have resolved.  Sob improved.  Diuresed well with IV lasix 20 mg daily. Diuresed about 1.4 lit since admission BNP is 484.  Troponins are negative.       Atrial fibrillation with RVR.  Rate in 100/min.  Transitioned to Cardizem to 120 mg daily.  His CHA2D2 score is 2, started him on xarelto for anti coagulation.  TSH WNL.     C diff colitis:  On oral vancomycin.  Advanced diet as tolerated.    Hypokalemia and hypomagnesemia:  Replaced. Recheck in am.    Chronic low back pain  Therapy evaluations ordered.  Pain control.     Anxiety and depression:     Pulmonary Embolism:  Completed Xarelto in 2019.     Hypertension: BP parameters are soft.    Tobacco Use disorder:   Alcohol Use disorder:  No signs of withdrawal.   Hyperlipidemia:  Continue with  Crestor.    Hyponatremia:  ? From fluid overload.  Much improved.    Macrocytic anemia/ anemia of chronic disease/ iron deficiency anemia.  Baseline hemoglobin around 8. Will add iron supplementation.  Check anemia panel and transfuse to keep hemoglobin greater than 7.     Estimated body mass index is 20.5 kg/m as calculated from the following:   Height as of this encounter: '5\' 8"'$  (1.727 m).   Weight as of this encounter: 61.1 kg.  Code Status: full code.  DVT Prophylaxis:   rivaroxaban (XARELTO) tablet 20 mg   Level of Care: Level of care: Progressive Family Communication: none at bedside.   Disposition Plan:     Remains inpatient appropriate:  pending further improvement.   Procedures:  None.   Consultants:   None.  Antimicrobials:   Anti-infectives (From admission, onward)    Start     Dose/Rate Route Frequency Ordered Stop   09/11/22 1800  vancomycin (VANCOCIN) capsule 125 mg        125 mg Oral 4 times daily 09/11/22 1547 09/21/22 1759  Medications  Scheduled Meds:  vitamin B-12  1,000 mcg Oral Daily   diltiazem  30 mg Oral Q6H   famotidine  20 mg Oral Daily   folic acid  1 mg Oral Daily   midodrine  10 mg Oral TID WC   mometasone-formoterol  2 puff Inhalation BID   potassium chloride  40 mEq Oral BID   rivaroxaban  20 mg Oral Q supper   rosuvastatin  10 mg Oral Daily   thiamine  100 mg Oral Daily   vancomycin  125 mg Oral QID   Continuous Infusions:   PRN Meds:.acetaminophen **OR** acetaminophen, alum & mag hydroxide-simeth, methocarbamol, prochlorperazine    Subjective:   Anthony [name] was seen and examined today.  He reports persistent abdominal discomfort.   Objective:   Vitals:   09/12/22 2229 09/13/22 0416 09/13/22 0500 09/13/22 0805  BP: 113/79 102/81    Pulse: 88 94    Resp: 18 18    Temp: (!) 97.5 F (36.4 C) (!) 97.5 F (36.4 C)    TempSrc: Oral Oral    SpO2: 100% 100%  99%  Weight:   61.1 kg   Height:         Intake/Output Summary (Last 24 hours) at 09/13/2022 1232 Last data filed at 09/13/2022 0526 Gross per 24 hour  Intake 650 ml  Output 550 ml  Net 100 ml    Filed Weights   09/11/22 0941 09/12/22 0500 09/13/22 0500  Weight: 61.2 kg 60.6 kg 61.1 kg     Exam General exam: Appears calm and comfortable  Respiratory system: Clear to auscultation. Respiratory effort normal. Cardiovascular system: S1 & S2 heard, irregularly irregular, no JVD, pedal edema improved.  Gastrointestinal system: Abdomen is soft, mildly tender, non distended, bs+ Central nervous system: Alert and oriented. No focal neurological deficits. Extremities: Symmetric 5 x 5 power. Skin: No rashes, lesions or ulcers Psychiatry:mood is appropriate.     Data Reviewed:  I have personally reviewed following labs and imaging studies   CBC Lab Results  Component Value Date   WBC 10.6 (H) 09/13/2022   RBC 2.74 (L) 09/13/2022   HGB 9.5 (L) 09/13/2022   HCT 30.4 (L) 09/13/2022   MCV 110.9 (H) 09/13/2022   MCH 34.7 (H) 09/13/2022   PLT 441 (H) 09/13/2022   MCHC 31.3 09/13/2022   RDW 15.2 09/13/2022   LYMPHSABS 1.5 09/13/2022   MONOABS 0.7 09/13/2022   EOSABS 0.1 09/13/2022   BASOSABS 0.1 [id number]     Last metabolic panel Lab Results  Component Value Date   NA 134 (L) 09/13/2022   K 3.5 09/13/2022   CL 107 09/13/2022   CO2 19 (L) 09/13/2022   BUN 9 09/13/2022   CREATININE 0.47 (L) 09/13/2022   GLUCOSE 97 09/13/2022   GFRNONAA >60 09/13/2022   GFRAA >60 07/20/2018   CALCIUM 7.6 (L) 09/13/2022   PHOS 3.5 09/13/2022   PROT 6.0 (L) 09/11/2022   ALBUMIN 2.4 (L) 09/11/2022   LABGLOB 2.7 11/27/2016   AGRATIO 1.5 11/27/2016   BILITOT 0.6 09/11/2022   ALKPHOS 77 09/11/2022   AST 21 09/11/2022   ALT 13 09/11/2022   ANIONGAP 8 09/13/2022    CBG (last 3)  No results for input(s): "GLUCAP" in the last 72 hours.    Coagulation Profile: No results for input(s): "INR", "PROTIME" in the last 168  hours.   Radiology Studies: No results found.     Hosie Poisson M.D. [facility] 09/13/2022, 12:32 PM  Available via  Epic secure chat 7am-7pm After 7 pm, please refer to night coverage provider listed on amion.

## 2022-09-13 NOTE — Evaluation (Signed)
Physical Therapy Evaluation Patient Details Name: JULIYAN WOOSLEY MRN: [id number] DOB: 1956-01-26 Today's Date: 09/13/2022  History of Present Illness  67 yo male admitted with Afib with RVR, acute on chronic HF, diarrhea, weakness. Hx of asp pna, ETOH use d/o, DTs, chronic pain, PE. Recent d/c 2/17  Clinical Impression  On eval, pt was Supv level for mobility. He walked a short distance around the room before having to use the bsc. BP was low-see vitals section in flowsheets. O2>90% on RA with activity. Pt denied dizziness during session. Recommend daily ambulation with nursing and/or mobility team to increase activity. Will plan to follow pt during this hospital stay.       Recommendations for follow up therapy are one component of a multi-disciplinary discharge planning process, led by the attending physician.  Recommendations may be updated based on patient status, additional functional criteria and insurance authorization.  Follow Up Recommendations No PT follow up      Assistance Recommended at Discharge PRN  Patient can return home with the following  Help with stairs or ramp for entrance;Assist for transportation    Equipment Recommendations  (could benefit from RW but pt needs hands free to carry belongings)  Recommendations for Other Services       Functional Status Assessment Patient has had a recent decline in their functional status and demonstrates the ability to make significant improvements in function in a reasonable and predictable amount of time.     Precautions / Restrictions Precautions Precautions: Fall Precaution Comments: monitor BP Restrictions Weight Bearing Restrictions: No      Mobility  Bed Mobility Overal bed mobility: Modified Independent                  Transfers Overall transfer level: Needs assistance   Transfers: Sit to/from Stand Sit to Stand: Supervision           General transfer comment: BP low-pt denied dizziness. Able to  converse throughout. O2 96% RA    Ambulation/Gait Ambulation/Gait assistance: Supervision Gait Distance (Feet): 20 Feet (x2) Assistive device: None Gait Pattern/deviations: Step-through pattern, Decreased stride length, Decreased step length - right, Decreased step length - left       General Gait Details: guarded, slow gait. no overt lob. short step lengths bilaterally. pt walked around room and used bsc  Stairs            Wheelchair Mobility    Modified Rankin (Stroke Patients Only)       Balance Overall balance assessment: Mild deficits observed, not formally tested, History of Falls                                           Pertinent Vitals/Pain Pain Assessment Pain Assessment: Faces Faces Pain Scale: Hurts a [place] bit Pain Location: abdomen Pain Descriptors / Indicators: Aching Pain Intervention(s): Monitored during session    Home Living Family/patient expects to be discharged to:: Unsure     Type of Home: Apartment (pt stated he recently moved into an apt)           Home Equipment: None      Prior Function Prior Level of Function : Independent/Modified Independent             Mobility Comments: reports not using assistive device although has had them in the past, states current difficulty walking to grocery store, feels he  needs use of his arms to "carry things"       Hand Dominance        Extremity/Trunk Assessment   Upper Extremity Assessment Upper Extremity Assessment: Defer to OT evaluation    Lower Extremity Assessment Lower Extremity Assessment: Generalized weakness    Cervical / Trunk Assessment Cervical / Trunk Assessment: Kyphotic Cervical / Trunk Exceptions: forward neck posture  Communication   Communication: HOH  Cognition Arousal/Alertness: Awake/alert Behavior During Therapy: WFL for tasks assessed/performed Overall Cognitive Status: Within Functional Limits for tasks assessed                                  General Comments: talkative        General Comments      Exercises     Assessment/Plan    PT Assessment Patient needs continued PT services  PT Problem List Decreased mobility;Decreased activity tolerance;Pain;Decreased balance       PT Treatment Interventions Gait training;Therapeutic exercise;Balance training;Functional mobility training;Therapeutic activities;Patient/family education    PT Goals (Current goals can be found in the Care Plan section)  Acute Rehab PT Goals Patient Stated Goal: to feel better PT Goal Formulation: With patient Time For Goal Achievement: 09/27/22 Potential to Achieve Goals: Good    Frequency Min 3X/week     Co-evaluation               AM-PAC PT "6 Clicks" Mobility  Outcome Measure Help needed turning from your back to your side while in a flat bed without using bedrails?: None Help needed moving from lying on your back to sitting on the side of a flat bed without using bedrails?: None Help needed moving to and from a bed to a chair (including a wheelchair)?: None Help needed standing up from a chair using your arms (e.g., wheelchair or bedside chair)?: A [place] Help needed to walk in hospital room?: A [place] Help needed climbing 3-5 steps with a railing? : A [place] 6 Click Score: 21    End of Session   Activity Tolerance: Patient tolerated treatment well Patient left: in bed;with call bell/phone within reach;with bed alarm set   PT Visit Diagnosis: Difficulty in walking, not elsewhere classified (R26.2)    Time: [id number] PT Time Calculation (min) (ACUTE ONLY): 36 min   Charges:   PT Evaluation $PT Eval Low Complexity: 1 Low PT Treatments $Gait Training: 8-22 mins          Weston Anna Shemere Doreatha Massed, PT Acute Rehabilitation  Office: [phone]

## 2022-09-14 ENCOUNTER — Other Ambulatory Visit (HOSPITAL_COMMUNITY): Payer: Self-pay

## 2022-09-14 DIAGNOSIS — I10 Essential (primary) hypertension: Secondary | ICD-10-CM | POA: Diagnosis not present

## 2022-09-14 DIAGNOSIS — I4891 Unspecified atrial fibrillation: Secondary | ICD-10-CM | POA: Diagnosis not present

## 2022-09-14 DIAGNOSIS — D539 Nutritional anemia, unspecified: Secondary | ICD-10-CM | POA: Diagnosis not present

## 2022-09-14 DIAGNOSIS — A0472 Enterocolitis due to Clostridium difficile, not specified as recurrent: Secondary | ICD-10-CM | POA: Diagnosis not present

## 2022-09-14 LAB — CBC WITH DIFFERENTIAL/PLATELET
Abs Immature Granulocytes: 0.04 10*3/uL (ref 0.00–0.07)
Basophils Absolute: 0.1 10*3/uL (ref 0.0–0.1)
Basophils Relative: 1 %
Eosinophils Absolute: 0.2 10*3/uL (ref 0.0–0.5)
Eosinophils Relative: 2 %
HCT: 29.3 % — ABNORMAL LOW (ref 39.0–52.0)
Hemoglobin: 9 g/dL — ABNORMAL LOW (ref 13.0–17.0)
Immature Granulocytes: 1 %
Lymphocytes Relative: 17 %
Lymphs Abs: 1.4 10*3/uL (ref 0.7–4.0)
MCH: 34.9 pg — ABNORMAL HIGH (ref 26.0–34.0)
MCHC: 30.7 g/dL (ref 30.0–36.0)
MCV: 113.6 fL — ABNORMAL HIGH (ref 80.0–100.0)
Monocytes Absolute: 0.6 10*3/uL (ref 0.1–1.0)
Monocytes Relative: 8 %
Neutro Abs: 6 10*3/uL (ref 1.7–7.7)
Neutrophils Relative %: 71 %
Platelets: 448 10*3/uL — ABNORMAL HIGH (ref 150–400)
RBC: 2.58 MIL/uL — ABNORMAL LOW (ref 4.22–5.81)
RDW: 15.4 % (ref 11.5–15.5)
WBC: 8.2 10*3/uL (ref 4.0–10.5)
nRBC: 0 % (ref 0.0–0.2)

## 2022-09-14 LAB — MAGNESIUM: Magnesium: 1.6 mg/dL — ABNORMAL LOW (ref 1.7–2.4)

## 2022-09-14 LAB — BASIC METABOLIC PANEL
Anion gap: 7 (ref 5–15)
BUN: 11 mg/dL (ref 8–23)
CO2: 19 mmol/L — ABNORMAL LOW (ref 22–32)
Calcium: 7.8 mg/dL — ABNORMAL LOW (ref 8.9–10.3)
Chloride: 110 mmol/L (ref 98–111)
Creatinine, Ser: 0.51 mg/dL — ABNORMAL LOW (ref 0.61–1.24)
GFR, Estimated: >60 mL/min (ref >60)
Glucose, Bld: 96 mg/dL (ref 70–99)
Potassium: 3.9 mmol/L (ref 3.5–5.1)
Sodium: 136 mmol/L (ref 135–145)

## 2022-09-14 LAB — PHOSPHORUS: Phosphorus: 2.7 mg/dL (ref 2.5–4.6)

## 2022-09-14 MED ORDER — MAGNESIUM SULFATE 4 GM/100ML IV SOLN
4.0000 g | Freq: Once | INTRAVENOUS | Status: AC
  Administered 2022-09-14: 4 g via INTRAVENOUS
  Filled 2022-09-14: qty 100

## 2022-09-14 MED ORDER — METOPROLOL TARTRATE 25 MG PO TABS
25.0000 mg | ORAL_TABLET | Freq: Two times a day (BID) | ORAL | Status: DC
  Administered 2022-09-14 (×2): 25 mg via ORAL
  Filled 2022-09-14 (×3): qty 1

## 2022-09-14 MED ORDER — DILTIAZEM HCL ER COATED BEADS 180 MG PO CP24
180.0000 mg | ORAL_CAPSULE | Freq: Every day | ORAL | Status: DC
  Administered 2022-09-15 – 2022-09-17 (×3): 180 mg via ORAL
  Filled 2022-09-14 (×3): qty 1

## 2022-09-14 NOTE — Progress Notes (Signed)
Heart Failure Navigator Progress Note  Assessed for Heart & Vascular TOC clinic readiness.  Patient EF 60-65% , admitted for A-fib with RVR, weakness. .   Navigator will sign off at this time.   Earnestine Leys, BSN, Clinical cytogeneticist Only

## 2022-09-14 NOTE — Progress Notes (Signed)
[facility]                                                                               Anthony [name], is a 67 y.o. male, DOB - 1955/10/17, [id number] Admit date - 09/11/2022    Outpatient Primary MD for the patient is Jacelyn Grip, MD  LOS - 2  days    Brief summary    Anthony [name] is a 67 y.o. 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  He was found to be in atrial fibrillation with RVR.   Assessment & Plan    Assessment and Plan:    Mild acute on chronic diastolic CHF:  Appears to have resolved.  Sob improved.  Diuresed well with IV lasix 20 mg daily. Diuresed about 1.4 lit since admission BNP is 484.  Troponins are negative.       Atrial fibrillation with RVR.  Rate betwee, 100 to 130/min on oob.  Transitioned to Cardizem to 120 mg daily, increased to '180mg'$  daily.  His CHA2D2 score is 2, started him on xarelto for anti coagulation.  TSH WNL.     C diff colitis:  Diarrhea improving.  On oral vancomycin.  Advanced diet as tolerated.    Hypokalemia and hypomagnesemia:  Replaced. Recheck in am.    Chronic low back pain  Therapy evaluations ordered.  Pain control.     Anxiety and depression:     Pulmonary Embolism:  Completed Xarelto in 2019.     Hypertension: BP parameters are soft, on midodrine.    Tobacco Use  disorder:   Alcohol Use disorder:  No signs of withdrawal.   Hyperlipidemia:  Continue with Crestor.    Hyponatremia:  ? From fluid overload. Resolved.  Much improved.    Macrocytic anemia/ anemia of chronic disease/ iron deficiency anemia.  Baseline hemoglobin around 8. Will add iron supplementation.  Anemia panel reviewed. Transfuse to keep hemoglobin greater than 7.     Estimated body mass index is 20.78 kg/m as calculated from the following:   Height as of this encounter: '5\' 8"'$  (1.727 m).   Weight as of this encounter: 62 kg.  Code Status: full code.  DVT Prophylaxis:   rivaroxaban (XARELTO) tablet 20 mg   Level of Care: Level of care: Progressive Family Communication: none at bedside.   Disposition Plan:     Remains inpatient appropriate:  pending further improvement.   Procedures:  None.   Consultants:   Cardiology.  Antimicrobials:   Anti-infectives (From admission, onward)    Start     Dose/Rate Route Frequency Ordered Stop   09/11/22 1800  vancomycin (VANCOCIN) capsule 125 mg        125 mg Oral  4 times daily 09/11/22 1547 09/21/22 1759        Medications  Scheduled Meds:  vitamin B-12  1,000 mcg Oral Daily   [START ON 09/15/2022] diltiazem  180 mg Oral Daily   famotidine  20 mg Oral Daily   feeding supplement  237 mL Oral BID BM   folic acid  1 mg Oral Daily   metoprolol tartrate  25 mg Oral BID   midodrine  10 mg Oral TID WC   mometasone-formoterol  2 puff Inhalation BID   multivitamin with minerals  1 tablet Oral Daily   rivaroxaban  20 mg Oral Q supper   rosuvastatin  10 mg Oral Daily   thiamine  100 mg Oral Daily   vancomycin  125 mg Oral QID   Continuous Infusions:   PRN Meds:.acetaminophen **OR** acetaminophen, alum & mag hydroxide-simeth, methocarbamol, prochlorperazine    Subjective:   [place] Willig was seen and examined today.  Abd pain improving. No palpitations.   Objective:   Vitals:   09/14/22 0500 09/14/22 0503  09/14/22 0821 09/14/22 1251  BP:  106/86  106/84  Pulse:  97  (!) 104  Resp:  20  20  Temp:  97.8 F (36.6 C)  97.8 F (36.6 C)  TempSrc:  Oral  Oral  SpO2:  100% 99% 100%  Weight: 62 kg     Height:        Intake/Output Summary (Last 24 hours) at 09/14/2022 1524 Last data filed at 09/14/2022 1100 Gross per 24 hour  Intake 240 ml  Output 100 ml  Net 140 ml    Filed Weights   09/12/22 0500 09/13/22 0500 09/14/22 0500  Weight: 60.6 kg 61.1 kg 62 kg     Exam General exam: Appears calm and comfortable  Respiratory system: Clear to auscultation. Respiratory effort normal. Cardiovascular system: S1 & S2 heard, RRR. No JVD, murmurs, Gastrointestinal system: Abdomen is nondistended, soft and nontender.  Central nervous system: Alert and oriented. No focal neurological deficits. Extremities: Symmetric 5 x 5 power. Skin: No rashes,  Psychiatry:  Mood & affect appropriate.      Data Reviewed:  I have personally reviewed following labs and imaging studies   CBC Lab Results  Component Value Date   WBC 8.2 09/14/2022   RBC 2.58 (L) 09/14/2022   HGB 9.0 (L) 09/14/2022   HCT 29.3 (L) 09/14/2022   MCV 113.6 (H) 09/14/2022   MCH 34.9 (H) 09/14/2022   PLT 448 (H) 09/14/2022   MCHC 30.7 09/14/2022   RDW 15.4 09/14/2022   LYMPHSABS 1.4 09/14/2022   MONOABS 0.6 09/14/2022   EOSABS 0.2 09/14/2022   BASOSABS 0.1 0000000     Last metabolic panel Lab Results  Component Value Date   NA 136 09/14/2022   K 3.9 09/14/2022   CL 110 09/14/2022   CO2 19 (L) 09/14/2022   BUN 11 09/14/2022   CREATININE 0.51 (L) 09/14/2022   GLUCOSE 96 09/14/2022   GFRNONAA >60 09/14/2022   GFRAA >60 07/20/2018   CALCIUM 7.8 (L) 09/14/2022   PHOS 2.7 09/14/2022   PROT 6.0 (L) 09/11/2022   ALBUMIN 2.4 (L) 09/11/2022   LABGLOB 2.7 11/27/2016   AGRATIO 1.5 11/27/2016   BILITOT 0.6 09/11/2022   ALKPHOS 77 09/11/2022   AST 21 09/11/2022   ALT 13 09/11/2022   ANIONGAP 7 09/14/2022    CBG  (last 3)  No results for input(s): "GLUCAP" in the last 72 hours.    Coagulation Profile: No results  for input(s): "INR", "PROTIME" in the last 168 hours.   Radiology Studies: No results found.     Hosie Poisson M.D. [facility] 09/14/2022, 3:24 PM  Available via Epic secure chat 7am-7pm After 7 pm, please refer to night coverage provider listed on amion.
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
## 2022-09-14 NOTE — Progress Notes (Signed)
OT Cancellation Note  Patient Details Name: Anthony [name] MRN: [id number] DOB: 09-18-1955   Cancelled Treatment:    Reason Eval/Treat Not Completed: Medical issues which prohibited therapy Patient is in bed with HR up to 130s. Nurse in room with patient at this time. OT to continue to follow and check back as schedule will allow.  Rennie Plowman, [facility] Department Office# [phone]  09/14/2022, 9:50 AM

## 2022-09-14 NOTE — TOC Benefit Eligibility Note (Signed)
Patient Teacher, English as a foreign language completed.    The patient is currently admitted and upon discharge could be taking vancomycin 125 mg capsules.  The current 7 day co-pay is $0.00.   The patient is insured through [place], [place] Patient Advocate Specialist [place] Patient Advocate Team Direct Number: [phone]  Fax: [phone]

## 2022-09-15 DIAGNOSIS — D539 Nutritional anemia, unspecified: Secondary | ICD-10-CM | POA: Diagnosis not present

## 2022-09-15 DIAGNOSIS — I10 Essential (primary) hypertension: Secondary | ICD-10-CM | POA: Diagnosis not present

## 2022-09-15 DIAGNOSIS — A0472 Enterocolitis due to Clostridium difficile, not specified as recurrent: Secondary | ICD-10-CM | POA: Diagnosis not present

## 2022-09-15 DIAGNOSIS — I4891 Unspecified atrial fibrillation: Secondary | ICD-10-CM | POA: Diagnosis not present

## 2022-09-15 LAB — CBC WITH DIFFERENTIAL/PLATELET
Abs Immature Granulocytes: 0.04 10*3/uL (ref 0.00–0.07)
Basophils Absolute: 0.1 10*3/uL (ref 0.0–0.1)
Basophils Relative: 2 %
Eosinophils Absolute: 0.2 10*3/uL (ref 0.0–0.5)
Eosinophils Relative: 3 %
HCT: 31.9 % — ABNORMAL LOW (ref 39.0–52.0)
Hemoglobin: 9.7 g/dL — ABNORMAL LOW (ref 13.0–17.0)
Immature Granulocytes: 1 %
Lymphocytes Relative: 29 %
Lymphs Abs: 1.7 10*3/uL (ref 0.7–4.0)
MCH: 35.1 pg — ABNORMAL HIGH (ref 26.0–34.0)
MCHC: 30.4 g/dL (ref 30.0–36.0)
MCV: 115.6 fL — ABNORMAL HIGH (ref 80.0–100.0)
Monocytes Absolute: 0.6 10*3/uL (ref 0.1–1.0)
Monocytes Relative: 10 %
Neutro Abs: 3.3 10*3/uL (ref 1.7–7.7)
Neutrophils Relative %: 55 %
Platelets: 421 10*3/uL — ABNORMAL HIGH (ref 150–400)
RBC: 2.76 MIL/uL — ABNORMAL LOW (ref 4.22–5.81)
RDW: 15.5 % (ref 11.5–15.5)
WBC: 5.8 10*3/uL (ref 4.0–10.5)
nRBC: 0 % (ref 0.0–0.2)

## 2022-09-15 LAB — BASIC METABOLIC PANEL
Anion gap: 5 (ref 5–15)
BUN: 12 mg/dL (ref 8–23)
CO2: 22 mmol/L (ref 22–32)
Calcium: 8.3 mg/dL — ABNORMAL LOW (ref 8.9–10.3)
Chloride: 108 mmol/L (ref 98–111)
Creatinine, Ser: 0.65 mg/dL (ref 0.61–1.24)
GFR, Estimated: >60 mL/min (ref >60)
Glucose, Bld: 94 mg/dL (ref 70–99)
Potassium: 4.2 mmol/L (ref 3.5–5.1)
Sodium: 135 mmol/L (ref 135–145)

## 2022-09-15 LAB — PHOSPHORUS: Phosphorus: 3.3 mg/dL (ref 2.5–4.6)

## 2022-09-15 LAB — MAGNESIUM: Magnesium: 1.7 mg/dL (ref 1.7–2.4)

## 2022-09-15 MED ORDER — METOPROLOL TARTRATE 25 MG PO TABS
12.5000 mg | ORAL_TABLET | Freq: Two times a day (BID) | ORAL | Status: DC
  Administered 2022-09-15 – 2022-09-17 (×5): 12.5 mg via ORAL
  Filled 2022-09-15 (×5): qty 1

## 2022-09-15 NOTE — Progress Notes (Signed)
Rounding Note    Patient Name: Nishaan A Smits Date of Encounter: 09/15/2022  [place] Cardiologist: Lauree Chandler, MD   Subjective   Patient resting comfortably in bed laying flat    Inpatient Medications    Scheduled Meds:  vitamin B-12  1,000 mcg Oral Daily   diltiazem  180 mg Oral Daily   famotidine  20 mg Oral Daily   feeding supplement  237 mL Oral BID BM   folic acid  1 mg Oral Daily   metoprolol tartrate  12.5 mg Oral BID   midodrine  10 mg Oral TID WC   mometasone-formoterol  2 puff Inhalation BID   multivitamin with minerals  1 tablet Oral Daily   rivaroxaban  20 mg Oral Q supper   rosuvastatin  10 mg Oral Daily   thiamine  100 mg Oral Daily   vancomycin  125 mg Oral QID   Continuous Infusions:  PRN Meds: acetaminophen **OR** acetaminophen, alum & mag hydroxide-simeth, methocarbamol, prochlorperazine   Vital Signs    Vitals:   09/14/22 1956 09/14/22 2009 09/15/22 0516 09/15/22 0803  BP:  113/64 107/83   Pulse:  82 73   Resp:      Temp:  98 F (36.7 C) 98.2 F (36.8 C)   TempSrc:  Oral Oral   SpO2: 98% 99% 100% 100%  Weight:   61.3 kg   Height:        Intake/Output Summary (Last 24 hours) at 09/15/2022 0944 Last data filed at 09/15/2022 0858 Gross per 24 hour  Intake 1900 ml  Output 675 ml  Net 1225 ml      09/15/2022    5:16 AM 09/14/2022    5:00 AM 09/13/2022    5:00 AM  Last 3 Weights  Weight (lbs) 135 lb 2.3 oz 136 lb 11 oz 134 lb 13 oz  Weight (kg) 61.3 kg 62 kg 61.15 kg      Telemetry    SR - Personally Reviewed  ECG    No new  - Personally Reviewed  Physical Exam   GEN: No acute distress.   Neck: JVP does not appear elevated  Cardiac: RRR, no murmur Respiratory: Clear to auscultation. GI: Soft, nontender, non-distended  MS: No edema; No deformity. Neuro:  Nonfocal  Psych: Normal affect   Labs    High Sensitivity Troponin:   Recent Labs  Lab 08/27/22 0123 08/27/22 0350 09/11/22 1029  09/11/22 1500  TROPONINIHS 19* 20* 7 8     Chemistry Recent Labs  Lab 09/11/22 1029 09/11/22 1500 09/13/22 0459 09/14/22 0417 09/15/22 0510  NA 131*   < > 134* 136 135  K 3.1*   < > 3.5 3.9 4.2  CL 100   < > 107 110 108  CO2 18*   < > 19* 19* 22  GLUCOSE 92   < > 97 96 94  BUN 7*   < > '9 11 12  '$ CREATININE 0.73   < > 0.47* 0.51* 0.65  CALCIUM 7.4*   < > 7.6* 7.8* 8.3*  MG  --    < > 1.6* 1.6* 1.7  PROT 6.0*  --   --   --   --   ALBUMIN 2.4*  --   --   --   --   AST 21  --   --   --   --   ALT 13  --   --   --   --   [facility]  77  --   --   --   --   BILITOT 0.6  --   --   --   --   GFRNONAA >60   < > >60 >60 >60  ANIONGAP 13   < > '8 7 5   '$ < > = values in this interval not displayed.    Lipids No results for input(s): "CHOL", "TRIG", "HDL", "LABVLDL", "LDLCALC", "CHOLHDL" in the last 168 hours.  Hematology Recent Labs  Lab 09/13/22 0459 09/14/22 0417 09/15/22 0510  WBC 10.6* 8.2 5.8  RBC 2.74* 2.58* 2.76*  HGB 9.5* 9.0* 9.7*  HCT 30.4* 29.3* 31.9*  MCV 110.9* 113.6* 115.6*  MCH 34.7* 34.9* 35.1*  MCHC 31.3 30.7 30.4  RDW 15.2 15.4 15.5  PLT 441* 448* 421*   Thyroid No results for input(s): "TSH", "FREET4" in the last 168 hours.  BNP Recent Labs  Lab 09/11/22 1029  BNP 484.0*    DDimer No results for input(s): "DDIMER" in the last 168 hours.   Radiology    No results found.  Cardiac Studies    Coronary CTA 10/02/2020: Impressions: 1. Coronary calcium score of 884. This was 91st percentile for age, sex, and race matched control. 2. Normal coronary origin.  Right dominance. 3. CAD-RADS 3. Moderate stenosis. Consider symptom-guided anti-ischemic pharmacotherapy as well as risk factor modification per guideline directed care. Additional analysis with CT FFR will be submitted. 4. 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 _______________   Echocardiogram 08/28/2022: Impressions: 1. Left  ventricular ejection fraction, by estimation, is 60 to 65%. The  left ventricle has normal function. The left ventricle has no regional  wall motion abnormalities. Left ventricular diastolic parameters are  indeterminate.   2. Right ventricular systolic function is normal. The right ventricular  size is mildly enlarged. Tricuspid regurgitation signal is inadequate for  assessing PA pressure.   3. The mitral valve is normal in structure. No evidence of mitral valve  regurgitation. No evidence of mitral stenosis.   4. The aortic valve was not well visualized. Aortic valve regurgitation  is not visualized. No aortic stenosis is present.     Patient Profile      Zenon A Sirico is a 67 y.o. male with a history of chronic diastolic CHF, paroxysmal atrial fibrillation, unprovoked PE, hypertension, chronic back pain, alcohol abuse with prior DTs, non-compliance, and homelessness who is being seen 09/14/2022 for the evaluation of atrial fibrillation with RVR  at the request of Dr. Karleen Hampshire.   Assessment & Plan    1  Atrial fibrillation    Pt has converted to SR  on diltiazem   Given noncomplance would not recomm an antiarrhythmic     Keep on diltiazem and metoprolol  ON Xarelto here but not prior to admit If compliant with meds and follow up he should be on it       2 Hypotension  Tolerating current meds  with midodrine 10 mg tid   COuld back down on dilt to 120 CD but with afib I would keep on low dose of this and metoprolol if possible      3  CAD  Nonobstrucive by CTA  4   HL  On Crestor here given CAD       WIll make appt for patient   Agan, compliance with meds is the real question.  Will sign off.     For questions or updates, please contact [facility] Please consult [email or web address] for contact info under        Signed, Dorris Carnes, MD  09/15/2022, 9:44 AM

## 2022-09-15 NOTE — Progress Notes (Signed)
PT Cancellation/Discharge from PT Note  Patient Details Name: Anthony [name] MRN: [id number] DOB: 18-Nov-1955   Cancelled Treatment:    Reason Eval/Treat Not Completed:  Attempted tx session a 2nd time-pt declined to participate. He denies need for PT services in a round about way-"what good is that going to do?." Pt has been evaluated by PT on 2/25. Will sign off at this time. Encouraged pt to let MD know if he changes his mind and would like to participate with PT.     Doreatha Massed, PT Acute Rehabilitation  Office: [phone]

## 2022-09-15 NOTE — Progress Notes (Signed)
OT Cancellation Note  Patient Details Name: Anthony [name] MRN: [id number] DOB: March 17, 1956   Cancelled Treatment:    Reason Eval/Treat Not Completed: Other (comment) Patient is in room with lunch. Patient when educated on OT role, patient went on long winded conversation about previous d/c from hospital and getting out of bed "wont make me stronger". Patient when educated that this therapist would check back another time when he does not have lunch. Also that this therapist cannot speak to previous discharges, patient reported " If you had half a brain you would be able to". Therapist excused herself from the room at this time. OT to continue to follow and check back at another time.  Rennie Plowman, MS Acute Rehabilitation Department Office# [phone]  09/15/2022, 1:38 PM

## 2022-09-15 NOTE — Progress Notes (Signed)
[facility]                                                                               Anthony [name], is a 67 y.o. male, DOB - 07/09/1956, [id number] Admit date - 09/11/2022    Outpatient Primary MD for the patient is Jacelyn Grip, MD  LOS - 3  days    Brief summary    Anthony [name] is a 67 y.o. 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  He was found to be in atrial fibrillation with RVR.  He was started on IV cardizem gtt and transitioned to oral cardizem .  Cardiology consulted and metoprolol was added and xarelto added.  Meanwhile he was also found to have c diff colitis and started on vancomycin for 10 days.  Therapy evals done and plan for [facility] for day shelter on discharge and bus pass.  TOC on board.  Assessment & Plan    Assessment and Plan:    Mild acute on chronic diastolic CHF:  Appears to have resolved.  Sob improved.  Diuresed well with IV lasix 20 mg daily. Diuresed about 1.4 lit since admission BNP is 484.  Troponins are negative.  He denies any chest pain or sob. He is on RA.       Atrial fibrillation with RVR. Paroxysmal, transitioned to sinus rhythm today.  Initially required IV cardizem gtt and transitioned to oral cardizem and metoprolol.  His CHA2D2 score is 2, started him on xarelto for anti coagulation.  TSH WNL.     C diff  colitis:  Diarrhea improving.  On oral vancomycin.  Advanced diet as tolerated.    Hypokalemia and hypomagnesemia:  Replaced.    Chronic low back pain  Therapy evaluations ordered.  Pain control.     Anxiety and depression:     Pulmonary Embolism:  Completed Xarelto in 2019.     Hypertension: BP parameters are soft, on midodrine.    Tobacco Use disorder:   Alcohol Use disorder:  No signs of withdrawal.   Hyperlipidemia:  Continue with Crestor.    Hyponatremia:  ? From fluid overload. Resolved.     Macrocytic anemia/ anemia of chronic disease/ iron deficiency anemia.  Baseline hemoglobin around 8. Will add iron supplementation.  Anemia panel reviewed. Transfuse to keep hemoglobin greater than 7.     Estimated body mass index is 20.55 kg/m as calculated from the following:   Height as of this encounter: '5\' 8"'$  (1.727 m).   Weight as of this encounter: 61.3 kg.  Code Status: full code.  DVT Prophylaxis:   rivaroxaban (XARELTO) tablet 20 mg   Level of Care: Level of care: Progressive Family Communication: none at bedside.  Disposition Plan:     Remains inpatient appropriate:  possible d/c in am.   Procedures:  None.   Consultants:   Cardiology.  Antimicrobials:   Anti-infectives (From admission, onward)    Start     Dose/Rate Route Frequency Ordered Stop   09/11/22 1800  vancomycin (VANCOCIN) capsule 125 mg        125 mg Oral 4 times daily 09/11/22 1547 09/21/22 1759        Medications  Scheduled Meds:  vitamin B-12  1,000 mcg Oral Daily   diltiazem  180 mg Oral Daily   famotidine  20 mg Oral Daily   feeding supplement  237 mL Oral BID BM   folic acid  1 mg Oral Daily   metoprolol tartrate  12.5 mg Oral BID   midodrine  10 mg Oral TID WC   mometasone-formoterol  2 puff Inhalation BID   multivitamin with minerals  1 tablet Oral Daily   rivaroxaban  20 mg Oral Q supper   rosuvastatin  10 mg Oral Daily   thiamine  100 mg Oral Daily    vancomycin  125 mg Oral QID   Continuous Infusions:   PRN Meds:.acetaminophen **OR** acetaminophen, alum & mag hydroxide-simeth, methocarbamol, prochlorperazine    Subjective:   [name] was seen and examined today.  Still some diarrhea earlier this am, abd pain  is improving, not resolved yet. No nausea,vomiting. No palpitations or chest pain.   Objective:   Vitals:   09/15/22 0516 09/15/22 0803 09/15/22 1215 09/15/22 1223  BP: 107/83  105/74 105/71  Pulse: 73  79 80  Resp:    17  Temp: 98.2 F (36.8 C)   98.7 F (37.1 C)  TempSrc: Oral   Oral  SpO2: 100% 100%  100%  Weight: 61.3 kg     Height:        Intake/Output Summary (Last 24 hours) at 09/15/2022 1557 Last data filed at 09/15/2022 1300 Gross per 24 hour  Intake 1200 ml  Output 650 ml  Net 550 ml    Filed Weights   09/13/22 0500 09/14/22 0500 09/15/22 0516  Weight: 61.1 kg 62 kg 61.3 kg     Exam General exam: Appears calm and comfortable  Respiratory system: Clear to auscultation. Respiratory effort normal. Cardiovascular system: S1 & S2 heard, RRR. No JVD, murmurs, Gastrointestinal system: Abdomen is nondistended, soft and nontender. No organomegaly or masses felt.  Central nervous system: Alert and oriented. No focal neurological deficits. Extremities: Symmetric 5 x 5 power. Skin: No rashes, lesions or ulcers Psychiatry:  Mood & affect appropriate.      Data Reviewed:  I have personally reviewed following labs and imaging studies   CBC Lab Results  Component Value Date   WBC 5.8 09/15/2022   RBC 2.76 (L) 09/15/2022   HGB 9.7 (L) 09/15/2022   HCT 31.9 (L) 09/15/2022   MCV 115.6 (H) 09/15/2022   MCH 35.1 (H) 09/15/2022   PLT 421 (H) 09/15/2022   MCHC 30.4 09/15/2022   RDW 15.5 09/15/2022   LYMPHSABS 1.7 09/15/2022   MONOABS 0.6 09/15/2022   EOSABS 0.2 09/15/2022   BASOSABS 0.1 123XX123     Last metabolic panel Lab Results  Component Value Date   NA 135 09/15/2022   K 4.2  09/15/2022   CL 108 09/15/2022   CO2 22 09/15/2022   BUN 12 09/15/2022   CREATININE 0.65 09/15/2022   GLUCOSE 94 09/15/2022   GFRNONAA >60 09/15/2022   GFRAA >60  07/20/2018   CALCIUM 8.3 (L) 09/15/2022   PHOS 3.3 09/15/2022   PROT 6.0 (L) 09/11/2022   ALBUMIN 2.4 (L) 09/11/2022   LABGLOB 2.7 11/27/2016   AGRATIO 1.5 11/27/2016   BILITOT 0.6 09/11/2022   ALKPHOS 77 09/11/2022   AST 21 09/11/2022   ALT 13 09/11/2022   ANIONGAP 5 09/15/2022    CBG (last 3)  No results for input(s): "GLUCAP" in the last 72 hours.    Coagulation Profile: No results for input(s): "INR", "PROTIME" in the last 168 hours.   Radiology Studies: No results found.     Hosie Poisson M.D. [facility] 09/15/2022, 3:57 PM  Available via Epic secure chat 7am-7pm After 7 pm, please refer to night coverage provider listed on amion.

## 2022-09-15 NOTE — TOC Progression Note (Signed)
Transition of Care [facility]) - Progression Note    Patient Details  Name: Anthony [name] MRN: [id number] Date of Birth: 1955-12-08  Transition of Care [facility]) CM/SW Contact  Nicasio Barlowe, Juliann Pulse, RN Phone Number: 09/15/2022, 3:23 PM  Clinical Narrative:  Damaris Schooner to patient about resources-informed of  DSS-housing/shelter resources, ALF is out of pocket expense & patient doesn't qualify -PT/OT. Can provide [employer] for day shelter @ d/c, & bus pass-patient agreed.MD updated.  Expected Discharge Plan: Homeless Shelter Barriers to Discharge: No Barriers Identified  Expected Discharge Plan and Services   Discharge Planning Services: CM Consult Post Acute Care Choice: Durable Medical Equipment (3n1) Living arrangements for the past 2 months: Homeless Shelter                 DME Arranged: 3-N-1 DME Agency: [employer] Date DME Agency Contacted: 09/11/22 Time DME Agency Contacted: 1500 Representative spoke with at DME Agency: kristin             Social Determinants of Health ([place]) Interventions SDOH Screenings   Food Insecurity: Food Insecurity Present (09/11/2022)  Housing: High Risk (09/11/2022)  Transportation Needs: Unmet Transportation Needs (09/11/2022)  Utilities: Not At Risk (09/11/2022)  Depression (PHQ2-9): Medium Risk (10/16/2020)  Tobacco Use: High Risk (09/11/2022)    Readmission Risk Interventions    08/28/2022    2:00 PM 08/27/2022   10:30 AM 08/27/2022   10:08 AM  Readmission Risk Prevention Plan  Transportation Screening Complete Complete   Medication Review Press photographer)  Complete   PCP or Specialist appointment within 3-5 days of discharge Complete Complete Complete  HRI or Home Care Consult  Complete Complete  SW Recovery Care/Counseling Consult Complete Complete Complete  Palliative Care Screening Not Applicable Not Applicable Complete  [place] Not Applicable Not Applicable Not Applicable

## 2022-09-15 NOTE — Progress Notes (Signed)
PT Cancellation Note  Patient Details Name: Anthony [name] MRN: [id number] DOB: [birth year]   Cancelled Treatment:    Reason Eval/Treat Not Completed:  Attempted tx sesson-pt currently on phone speaking with someone about the meals being sent up. will check back as schedule allows.    [facility] [facility]  Office: [phone]

## 2022-09-16 DIAGNOSIS — F17200 Nicotine dependence, unspecified, uncomplicated: Secondary | ICD-10-CM

## 2022-09-16 LAB — CBC WITH DIFFERENTIAL/PLATELET
Abs Immature Granulocytes: 0.06 10*3/uL (ref 0.00–0.07)
Basophils Absolute: 0.1 10*3/uL (ref 0.0–0.1)
Basophils Relative: 1 %
Eosinophils Absolute: 0.1 10*3/uL (ref 0.0–0.5)
Eosinophils Relative: 2 %
HCT: 28.1 % — ABNORMAL LOW (ref 39.0–52.0)
Hemoglobin: 8.5 g/dL — ABNORMAL LOW (ref 13.0–17.0)
Immature Granulocytes: 1 %
Lymphocytes Relative: 35 %
Lymphs Abs: 2 10*3/uL (ref 0.7–4.0)
MCH: 34.6 pg — ABNORMAL HIGH (ref 26.0–34.0)
MCHC: 30.2 g/dL (ref 30.0–36.0)
MCV: 114.2 fL — ABNORMAL HIGH (ref 80.0–100.0)
Monocytes Absolute: 0.7 10*3/uL (ref 0.1–1.0)
Monocytes Relative: 12 %
Neutro Abs: 2.8 10*3/uL (ref 1.7–7.7)
Neutrophils Relative %: 49 %
Platelets: 360 10*3/uL (ref 150–400)
RBC: 2.46 MIL/uL — ABNORMAL LOW (ref 4.22–5.81)
RDW: 15.3 % (ref 11.5–15.5)
WBC: 5.8 10*3/uL (ref 4.0–10.5)
nRBC: 0 % (ref 0.0–0.2)

## 2022-09-16 LAB — MAGNESIUM: Magnesium: 1.4 mg/dL — ABNORMAL LOW (ref 1.7–2.4)

## 2022-09-16 LAB — PHOSPHORUS: Phosphorus: 3.7 mg/dL (ref 2.5–4.6)

## 2022-09-16 MED ORDER — MAGNESIUM SULFATE 4 GM/100ML IV SOLN
4.0000 g | Freq: Once | INTRAVENOUS | Status: AC
  Administered 2022-09-16: 4 g via INTRAVENOUS
  Filled 2022-09-16: qty 100

## 2022-09-16 MED ORDER — FERROUS SULFATE 325 (65 FE) MG PO TABS
325.0000 mg | ORAL_TABLET | Freq: Every day | ORAL | Status: DC
  Administered 2022-09-16 – 2022-09-17 (×2): 325 mg via ORAL
  Filled 2022-09-16 (×2): qty 1

## 2022-09-16 NOTE — Progress Notes (Signed)
OT Cancellation Note  Patient Details Name: Anthony [name] MRN: [id number] DOB: 1956-06-24   Cancelled Treatment:    Reason Eval/Treat Not Completed: OT screened, no needs identified, will sign off. Patient is loquacious and getting him to report on his current abilities is difficult as he fixates on what he does and needs at discharge- like crossing multiple lanes of traffic. Patient is independent with toileting and ADLs per his reports and nursing documentation and appears to have no acute care OT needs. He reports concerns about walking to the bus stop and what he does after he gets to the bus stop. Will defer to [facility] for discharge planning assistance.  Anthony [name] L Syna Gad 09/16/2022, 10:16 AM

## 2022-09-16 NOTE — Progress Notes (Signed)
[facility]                                                                               Anthony [name], is a 67 y.o. male, DOB - 05-11-1956, [id number] Admit date - 09/11/2022    Outpatient Primary MD for the patient is Jacelyn Grip, MD  LOS - 4  days    Brief summary   Anthony [name] is a 67 y.o. 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  He was found to be in atrial fibrillation with RVR.  He was started on IV cardizem gtt and transitioned to oral cardizem .  Cardiology consulted and metoprolol was added and xarelto added.  Meanwhile he was also found to have c diff colitis and started on vancomycin for 10 days.  Therapy evals done and plan for [facility] for day shelter on discharge and bus pass.  TOC on board.  Assessment & Plan    Assessment and Plan:  Acute on chronic diastolic CHF Stable, on RA  Diuresed well with IV lasix 20 mg daily. Diuresed about 1.4 lit since admission BNP is 484  Troponins are negative   Paroxysmal Atrial fibrillation with RVR Now in SR Initially required IV cardizem gtt and transitioned to oral cardizem and metoprolol His CHA2D2 score is 2, started him on xarelto for anti coagulation TSH WNL  C diff colitis  Diarrhea improving  On oral vancomycin.  Advanced diet as tolerated  Hypokalemia and hypomagnesemia Replace prn  Chronic low  back pain  Therapy evaluations ordered Pain control  Hypertension BP parameters stable Continue on midodrine  Macrocytic anemia/iron deficiency anemia Baseline hemoglobin around 8  Anemia panel showed iron 11, sats 5, folate 13, B12-->474 Transfuse to keep hemoglobin greater than 7 Oral iron, vitamin B12 supplementation  Hyperlipidemia  Continue with Crestor.   Tobacco Use disorder Advised to quit  Alcohol Use disorder No signs of withdrawal Advised to quit  Homeless Multiple social issues/complaints TOC on board    Estimated body mass index is 20.82 kg/m as calculated from the following:   Height as of this encounter: '5\' 8"'$  (1.727 m).   Weight as of this encounter: 62.1 kg.  Code Status: full code.  DVT Prophylaxis:   rivaroxaban (XARELTO) tablet 20 mg   Level of Care: Level of care: Progressive Family Communication: none at bedside.   Disposition Plan:    Homeless shelter  Procedures:  None.   Consultants:   Cardiology.    Antimicrobials:   Anti-infectives (From admission, onward)    Start     Dose/Rate Route Frequency Ordered Stop   09/11/22 1800  vancomycin (VANCOCIN) capsule 125 mg        125 mg  Oral 4 times daily 09/11/22 1547 09/21/22 1759        Medications  Scheduled Meds:  vitamin B-12  1,000 mcg Oral Daily   diltiazem  180 mg Oral Daily   famotidine  20 mg Oral Daily   feeding supplement  237 mL Oral BID BM   ferrous sulfate  325 mg Oral Q breakfast   folic acid  1 mg Oral Daily   metoprolol tartrate  12.5 mg Oral BID   midodrine  10 mg Oral TID WC   mometasone-formoterol  2 puff Inhalation BID   multivitamin with minerals  1 tablet Oral Daily   rivaroxaban  20 mg Oral Q supper   rosuvastatin  10 mg Oral Daily   thiamine  100 mg Oral Daily   vancomycin  125 mg Oral QID   Continuous Infusions:   PRN Meds:.acetaminophen **OR** acetaminophen, alum & mag hydroxide-simeth, methocarbamol, prochlorperazine    Subjective:    Patient seen and examined at bedside, still reports some some diarrhea but noted improvement.  Reports multiple social issues about transportation, shelter issues, about shelter closing over the weekends, unable to ambulate long distance because of his significant back issues.  Reports that if he is discharged he will come right back  Objective:   Vitals:   09/15/22 2010 09/16/22 0538 09/16/22 0805 09/16/22 1400  BP: 111/77 110/69  108/72  Pulse: 72 85  85  Resp:      Temp: 98.9 F (37.2 C) 98.6 F (37 C)  98.8 F (37.1 C)  TempSrc: Oral Oral  Oral  SpO2: 98% 99% 100% 97%  Weight:  62.1 kg    Height:        Intake/Output Summary (Last 24 hours) at 09/16/2022 1808 Last data filed at 09/16/2022 1734 Gross per 24 hour  Intake 960 ml  Output 1925 ml  Net -965 ml   Filed Weights   09/14/22 0500 09/15/22 0516 09/16/22 0538  Weight: 62 kg 61.3 kg 62.1 kg     Exam General: NAD  Cardiovascular: S1, S2 present Respiratory: CTAB Abdomen: Soft, nontender, nondistended, bowel sounds present Musculoskeletal: No bilateral pedal edema noted Skin: Normal Psychiatry: Normal mood       Data Reviewed:  I have personally reviewed following labs and imaging studies   CBC Lab Results  Component Value Date   WBC 5.8 09/16/2022   RBC 2.46 (L) 09/16/2022   HGB 8.5 (L) 09/16/2022   HCT 28.1 (L) 09/16/2022   MCV 114.2 (H) 09/16/2022   MCH 34.6 (H) 09/16/2022   PLT 360 09/16/2022   MCHC 30.2 09/16/2022   RDW 15.3 09/16/2022   LYMPHSABS 2.0 09/16/2022   MONOABS 0.7 09/16/2022   EOSABS 0.1 09/16/2022   BASOSABS 0.1 123XX123     Last metabolic panel Lab Results  Component Value Date   NA 135 09/15/2022   K 4.2 09/15/2022   CL 108 09/15/2022   CO2 22 09/15/2022   BUN 12 09/15/2022   CREATININE 0.65 09/15/2022   GLUCOSE 94 09/15/2022   GFRNONAA >60 09/15/2022   GFRAA >60 07/20/2018   CALCIUM 8.3 (L) 09/15/2022   PHOS 3.7 09/16/2022   PROT 6.0 (L) 09/11/2022    ALBUMIN 2.4 (L) 09/11/2022   LABGLOB 2.7 11/27/2016   AGRATIO 1.5 11/27/2016   BILITOT 0.6 09/11/2022   ALKPHOS 77 09/11/2022   AST 21 09/11/2022   ALT 13 09/11/2022   ANIONGAP 5 09/15/2022    CBG (last 3)  No results for input(s): "GLUCAP"  in the last 72 hours.    Coagulation Profile: No results for input(s): "INR", "PROTIME" in the last 168 hours.   Radiology Studies: No results found.     Alma Friendly M.D. [facility] 09/16/2022, 6:08 PM  Available via Epic secure chat 7am-7pm After 7 pm, please refer to night coverage provider listed on amion.

## 2022-09-17 ENCOUNTER — Other Ambulatory Visit (HOSPITAL_COMMUNITY): Payer: Self-pay

## 2022-09-17 LAB — CBC WITH DIFFERENTIAL/PLATELET
Abs Immature Granulocytes: 0.07 10*3/uL (ref 0.00–0.07)
Basophils Absolute: 0.1 10*3/uL (ref 0.0–0.1)
Basophils Relative: 1 %
Eosinophils Absolute: 0.1 10*3/uL (ref 0.0–0.5)
Eosinophils Relative: 3 %
HCT: 31 % — ABNORMAL LOW (ref 39.0–52.0)
Hemoglobin: 9.8 g/dL — ABNORMAL LOW (ref 13.0–17.0)
Immature Granulocytes: 1 %
Lymphocytes Relative: 32 %
Lymphs Abs: 1.6 10*3/uL (ref 0.7–4.0)
MCH: 34.6 pg — ABNORMAL HIGH (ref 26.0–34.0)
MCHC: 31.6 g/dL (ref 30.0–36.0)
MCV: 109.5 fL — ABNORMAL HIGH (ref 80.0–100.0)
Monocytes Absolute: 0.5 10*3/uL (ref 0.1–1.0)
Monocytes Relative: 11 %
Neutro Abs: 2.6 10*3/uL (ref 1.7–7.7)
Neutrophils Relative %: 52 %
Platelets: 383 10*3/uL (ref 150–400)
RBC: 2.83 MIL/uL — ABNORMAL LOW (ref 4.22–5.81)
RDW: 15.3 % (ref 11.5–15.5)
WBC: 5 10*3/uL (ref 4.0–10.5)
nRBC: 0 % (ref 0.0–0.2)

## 2022-09-17 LAB — MAGNESIUM: Magnesium: 1.6 mg/dL — ABNORMAL LOW (ref 1.7–2.4)

## 2022-09-17 MED ORDER — METOPROLOL TARTRATE 25 MG PO TABS
12.5000 mg | ORAL_TABLET | Freq: Two times a day (BID) | ORAL | 0 refills | Status: DC
  Filled 2022-09-17: qty 30, 30d supply, fill #0

## 2022-09-17 MED ORDER — RIVAROXABAN 20 MG PO TABS
20.0000 mg | ORAL_TABLET | Freq: Every day | ORAL | 0 refills | Status: DC
  Filled 2022-09-17: qty 30, 30d supply, fill #0

## 2022-09-17 MED ORDER — FERROUS SULFATE 325 (65 FE) MG PO TABS
325.0000 mg | ORAL_TABLET | Freq: Every day | ORAL | 0 refills | Status: DC
  Filled 2022-09-17: qty 30, 30d supply, fill #0

## 2022-09-17 MED ORDER — FAMOTIDINE 20 MG PO TABS
20.0000 mg | ORAL_TABLET | Freq: Every day | ORAL | 0 refills | Status: DC
  Filled 2022-09-17: qty 30, 30d supply, fill #0

## 2022-09-17 MED ORDER — DILTIAZEM HCL ER COATED BEADS 180 MG PO CP24
180.0000 mg | ORAL_CAPSULE | Freq: Every day | ORAL | 0 refills | Status: DC
  Filled 2022-09-17: qty 30, 30d supply, fill #0

## 2022-09-17 MED ORDER — CYANOCOBALAMIN 1000 MCG PO TABS
1000.0000 ug | ORAL_TABLET | Freq: Every day | ORAL | 0 refills | Status: DC
  Filled 2022-09-17: qty 30, 30d supply, fill #0

## 2022-09-17 MED ORDER — MAGNESIUM SULFATE 4 GM/100ML IV SOLN
4.0000 g | Freq: Once | INTRAVENOUS | Status: AC
  Administered 2022-09-17: 4 g via INTRAVENOUS
  Filled 2022-09-17: qty 100

## 2022-09-17 MED ORDER — VANCOMYCIN HCL 125 MG PO CAPS
125.0000 mg | ORAL_CAPSULE | Freq: Four times a day (QID) | ORAL | 0 refills | Status: AC
  Filled 2022-09-17: qty 17, 5d supply, fill #0

## 2022-09-17 NOTE — TOC Transition Note (Addendum)
Transition of Care [facility]) - CM/SW Discharge Note   Patient Details  Name: Anthony [name] MRN: [id number] Date of Birth: 17-Oct-1955  Transition of Care [facility]) CM/SW Contact:  Dessa Phi, RN Phone Number: 09/17/2022, 1:48 PM   Clinical Narrative:Provided nurse with bus pass to [facility] for patient @ d/c. He has already received a 3n1. May have to upgrade transportation to a taxi.   -2:37p-patient has a 3n1 also-will provide taxi voucher to [facility]. No further CM needs.     Final next level of care: Homeless Shelter Barriers to Discharge: No Barriers Identified   Patient Goals and CMS Choice CMS Medicare.gov Compare Post Acute Care list provided to:: Patient Choice offered to / list presented to : Patient  Discharge Placement                         Discharge Plan and Services Additional resources added to the After Visit Summary for     Discharge Planning Services: CM Consult Post Acute Care Choice: Durable Medical Equipment (3n1)          DME Arranged: 3-N-1 DME Agency: [employer] Date DME Agency Contacted: 09/11/22 Time DME Agency Contacted: 1500 Representative spoke with at DME Agency: [place]            Social Determinants of Health ([place]) Interventions SDOH Screenings   Food Insecurity: Food Insecurity Present (09/11/2022)  Housing: High Risk (09/11/2022)  Transportation Needs: Unmet Transportation Needs (09/11/2022)  Utilities: Not At Risk (09/11/2022)  Depression (PHQ2-9): Medium Risk (10/16/2020)  Tobacco Use: High Risk (09/11/2022)     Readmission Risk Interventions    08/28/2022    2:00 PM 08/27/2022   10:30 AM 08/27/2022   10:08 AM  Readmission Risk Prevention Plan  Transportation Screening Complete Complete   Medication Review Press photographer)  Complete   PCP or Specialist appointment within 3-5 days of discharge Complete Complete Complete  HRI or Home Care Consult  Complete Complete  SW Recovery Care/Counseling Consult  Complete Complete Complete  Palliative Care Screening Not Applicable Not Applicable Complete  [place] Not Applicable Not Applicable Not Applicable

## 2022-09-17 NOTE — Plan of Care (Signed)
  Problem: Education: Goal: Knowledge of disease or condition will improve Outcome: Progressing   Problem: Clinical Measurements: Goal: Ability to maintain clinical measurements within normal limits will improve Outcome: Adequate for Discharge Goal: Will remain free from infection Outcome: Adequate for Discharge Goal: Cardiovascular complication will be avoided Outcome: Adequate for Discharge

## 2022-09-17 NOTE — Progress Notes (Signed)
Pharmacy meds delivered to staff nurse.

## 2022-09-17 NOTE — Discharge Summary (Signed)
Physician Discharge Summary   Patient: Anthony [name] MRN: [id number] DOB: Jan 09, 1956  Admit date:     09/11/2022  Discharge date: 09/17/22  Discharge Physician: Alma Friendly   PCP: Jacelyn Grip, MD   Recommendations at discharge:   Follow-up with PCP in 1 week Follow-up with cardiology as scheduled   Discharge Diagnoses: Principal Problem:   Atrial fibrillation with RVR (HCC) Active Problems:   Tobacco use disorder   Acute on chronic diastolic CHF (congestive heart failure) (HCC)   Alcohol abuse   Dyslipidemia   HTN (hypertension)   Macrocytic anemia   Hypokalemia   Malnutrition of moderate degree   Gastritis and gastroduodenitis   Hyponatremia   Aortic atherosclerosis (HCC)   Moderate protein malnutrition (HCC)   Chronic diarrhea   C. difficile colitis   Atrial fibrillation [facility])     Hospital Course: Anthony [name] is a 67 y.o. 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  He was found to be in atrial fibrillation with RVR.  He was started on IV cardizem gtt and transitioned to oral cardizem. Cardiology consulted and metoprolol was added and xarelto added. Meanwhile he was also found to have c diff colitis and started on vancomycin for 10 days. Therapy evals done and plan for [facility] for day shelter on discharge and bus/taxi pass.      Today, pt with multiple social  complaints, but overall reports feeling better.  Denies any diarrhea, abdominal pain, shortness of breath, chest pain, nausea/vomiting.  Arranged for medication to be brought by patient's bedside to promote compliance.  Patient is high risk for readmission due to homelessness.  Advised to follow-up with cardiology as well as PCP.     Assessment and Plan:  ???Acute on chronic diastolic CHF Vs flash pulmonary edema Stable, on RA  Diuresed well with IV lasix 20 mg daily for a few days.  Diuresed about 1.4 lit since admission BNP is 484  Echo with normal EF with undetermined diastolic dysfunction Troponins are negative Cardiology consulted, no further recommendations for Lasix   Paroxysmal Atrial fibrillation with RVR Now in SR Initially required IV cardizem gtt and transitioned to oral cardizem and metoprolol His CHA2D2 score is 2, started him on xarelto for anti coagulation TSH WNL Discharged on Cardizem, metoprolol and Xarelto   C diff colitis  Diarrhea resolved Discharge on oral vancomycin for total of 10 days   Hypokalemia and hypomagnesemia Replaced prn   Chronic low back pain  Pain management   Hypertension BP stable Continue on midodrine   Macrocytic anemia/iron deficiency anemia Baseline hemoglobin around 8  Anemia panel showed iron 11, sats 5, folate 13, B12-->474 Continue oral iron, vitamin B12 supplementation   Hyperlipidemia  Continue with Crestor.    Tobacco Use disorder Advised to quit   Alcohol Use disorder No signs of withdrawal Advised to quit   Homeless Multiple social issues/complaints TOC consulted       Consultants: Cardiology Procedures performed: None Disposition: Homeless shelter Diet recommendation:  Regular diet   DISCHARGE  MEDICATION: Allergies as of 09/17/2022       Reactions   Other Itching, Other (See Comments)   Seasonal allergies- Itchy eyes, runny nose, congestion   Poison Ivy Extract Rash        Medication List      STOP taking these medications    aspirin EC 325 MG tablet   Dulera 100-5 MCG/ACT Aero Generic drug: mometasone-formoterol   loratadine 10 MG tablet Commonly known as: CLARITIN   Metamucil 0.52 g capsule Generic drug: psyllium   nystatin powder Commonly known as: MYCOSTATIN/NYSTOP   pantoprazole 40 MG tablet Commonly known as: PROTONIX   thiamine 100 MG tablet Commonly known as: Vitamin B-1       TAKE these medications    acetaminophen 500 MG tablet Commonly known as: TYLENOL Take 500-1,000 mg by mouth every 6 (six) hours as needed (for pain, headaches, or dental pain).   budesonide-formoterol 160-4.5 MCG/ACT inhaler Commonly known as: SYMBICORT Inhale 2 puffs into the lungs 2 (two) times daily.   cyanocobalamin 1000 MCG tablet Take 1 tablet (1,000 mcg total) by mouth daily. Start taking on: September 18, 2022   diltiazem 180 MG 24 hr capsule Commonly known as: CARDIZEM CD Take 1 capsule (180 mg total) by mouth daily. Start taking on: September 18, 2022   famotidine 20 MG tablet Commonly known as: PEPCID Take 1 tablet (20 mg total) by mouth daily. Start taking on: September 18, 2022   ferrous sulfate 325 (65 FE) MG tablet Take 1 tablet (325 mg total) by mouth daily with breakfast. Start taking on: March 1, 123456   folic acid 1 MG tablet Commonly known as: FOLVITE Take 1 tablet (1 mg total) by mouth daily.   loperamide 2 MG capsule Commonly known as: IMODIUM Take 2 capsules (4 mg total) by mouth as needed for diarrhea or loose stools.   methocarbamol 750 MG tablet Commonly known as: ROBAXIN Take 1 tablet (750 mg total) by mouth every 8 (eight) hours as needed for muscle spasms.   metoprolol tartrate 25 MG tablet Commonly known as: LOPRESSOR Take 1/2 tablet (12.5 mg total) by mouth 2 (two) times daily.   midodrine 10 MG tablet Commonly known as: PROAMATINE Take 1 tablet (10 mg total) by mouth 3 (three) times daily with meals.   rosuvastatin 10 MG  tablet Commonly known as: CRESTOR Take 1 tablet (10 mg total) by mouth daily.   vancomycin 125 MG capsule Commonly known as: VANCOCIN Take 1 capsule (125 mg total) by mouth 4 (four) times daily for 17 doses.   Xarelto 20 MG Tabs tablet Generic drug: rivaroxaban Take 1 tablet (20 mg) by mouth daily with supper.               Durable Medical Equipment  (From admission, onward)           Start     Ordered   09/11/22 1507  For home use only DME 3 n 1  Once        09/11/22 1506            Follow-up [facility] Follow up.   Why: bedside commode Contact information: [place] [address] [phone]         Richardson Dopp T, PA-C Follow up.   Specialties: Cardiology, Physician Assistant Why: Hospital follow-up with Cardiology scheduled for 09/29/2022 at 10:05am. Please arrive 15 minutes early for check-in. If this date/time does not work for  you, please call our office to reschedule. Contact information: [id number] [address] [place] Follow up.   Why: must be there by 11a day of discharge. Contact information: [address]([place]) [place] [address] [phone])        Jacelyn Grip, MD. Schedule an appointment as soon as possible for a visit in 1 week(s).   Specialty: Family Medicine Contact information: [place] [address] [phone]                Discharge Exam: Danley Danker Weights   09/15/22 0516 09/16/22 0538 09/17/22 0604  Weight: 61.3 kg 62.1 kg 62.2 kg   General: NAD, disheveled Cardiovascular: S1, S2 present Respiratory: CTAB Abdomen: Soft, nontender, nondistended, bowel sounds present Musculoskeletal: No bilateral pedal edema noted Skin: Normal Psychiatry: Normal mood   Condition at discharge: stable    The results of significant diagnostics from this hospitalization  (including imaging, microbiology, ancillary and laboratory) are listed below for reference.   Imaging Studies: DG Chest Port 1 View  Result Date: 09/11/2022 CLINICAL DATA:  Shortness of breath.  Diarrhea and weakness. EXAM: PORTABLE CHEST 1 VIEW COMPARISON:  Single-view of the chest 08/27/2022. PA and lateral chest 07/15/2022. CT chest 08/27/2022 FINDINGS: There is basilar atelectasis on the left. The lungs are otherwise clear. Heart size is upper normal. Aortic atherosclerosis noted. No pneumothorax or pleural fluid. No acute bony abnormality. IMPRESSION: No acute disease. Electronically Signed   By: Inge Rise M.D.   On: 09/11/2022 11:13   ECHOCARDIOGRAM COMPLETE  Result Date: 08/28/2022    ECHOCARDIOGRAM REPORT   Patient Name:   Anthony [name] Date of Exam: 08/28/2022 Medical Rec #:  [id number]    Height:       70.0 in Accession #:    [id number]   Weight:       134.0 lb Date of Birth:  [birth year]    BSA:          1.761 m Patient Age:    30 years     BP:           99/72 mmHg Patient Gender: M            HR:           79 bpm. Exam Location:  Inpatient Procedure: 2D Echo, Cardiac Doppler, Color Doppler and Intracardiac            Opacification Agent Indications:    I50.40* Unspecified combined systolic (congestive) and diastolic                 (congestive) heart failure  History:        Patient has prior history of Echocardiogram examinations, most                 recent 03/17/2022. Signs/Symptoms:Altered Mental Status, Dyspnea                 and Shortness of Breath; Risk Factors:Hypertension and Current                 Smoker. ETOH.  Sonographer:    Roseanna Rainbow RDCS Referring Phys: [id number] ANAND D HONGALGI  Sonographer Comments: Technically difficult study due to poor echo windows, suboptimal parasternal window, suboptimal apical window and suboptimal subcostal window. Image acquisition challenging due to uncooperative patient and Image acquisition challenging due to patient behavioral factors. Patient in  restraints attempting to get up during study. Patient supine and could not turn. IMPRESSIONS  1. Left ventricular ejection fraction, by estimation, is 60 to 65%. The left ventricle has normal function. The left ventricle has no regional wall motion abnormalities. Left ventricular diastolic parameters are indeterminate.  2. Right ventricular systolic function is normal. The right ventricular size is mildly enlarged. Tricuspid regurgitation signal is inadequate for assessing PA pressure.  3. The mitral valve is normal in structure. No evidence of mitral valve regurgitation. No evidence of mitral stenosis.  4. The aortic valve was not well visualized. Aortic valve regurgitation is not visualized. No aortic stenosis is present. FINDINGS  Left Ventricle: Left ventricular ejection fraction, by estimation, is 60 to 65%. The left ventricle has normal function. The left ventricle has no regional wall motion abnormalities. Definity contrast agent was given IV to delineate the left ventricular  endocardial borders. The left ventricular internal cavity size was normal in size. There is no left ventricular hypertrophy. Left ventricular diastolic parameters are indeterminate. Right Ventricle: The right ventricular size is mildly enlarged. Right vetricular wall thickness was not well visualized. Right ventricular systolic function is normal. Tricuspid regurgitation signal is inadequate for assessing PA pressure. Left Atrium: Left atrial size was normal in size. Right Atrium: Right atrial size was normal in size. Pericardium: There is no evidence of pericardial effusion. Presence of epicardial fat layer. Mitral Valve: The mitral valve is normal in structure. No evidence of mitral valve regurgitation. No evidence of mitral valve stenosis. Tricuspid Valve: The tricuspid valve is normal in structure. Tricuspid valve regurgitation is trivial. Aortic Valve: The aortic valve was not well visualized. Aortic valve regurgitation is not  visualized. No aortic stenosis is present. Pulmonic Valve: The pulmonic valve was not well visualized. Pulmonic valve regurgitation is not visualized. Aorta: The aortic root is normal in size and structure. IAS/Shunts: The interatrial septum was not well visualized.  LEFT VENTRICLE PLAX 2D LVIDd:         4.40 cm LVIDs:         3.70 cm LV PW:         0.90 cm LV IVS:        0.70 cm LVOT diam:     2.50 cm LV SV:         46 LV SV Index:   26 LVOT Area:     4.91 cm  LV Volumes (MOD) LV vol s, MOD A4C: 53.0 ml RIGHT VENTRICLE RV S prime:     12.20 cm/s TAPSE (M-mode): 1.3 cm LEFT ATRIUM           Index        RIGHT ATRIUM           Index LA diam:      3.90 cm 2.21 cm/m   RA Area:     14.10 cm LA Vol (A2C): 9.9 ml  5.62 ml/m   RA Volume:   29.80 ml  16.92 ml/m LA Vol (A4C): 46.6 ml 26.46 ml/m  AORTIC VALVE LVOT Vmax:   59.30 cm/s LVOT Vmean:  37.400 cm/s LVOT VTI:    0.094 m  AORTA Ao Root diam: 3.70 cm MITRAL VALVE MV Area (PHT): 3.50 cm    SHUNTS MV Decel Time: 217 msec    Systemic VTI:  0.09 m MV E velocity: 48.30 cm/s  Systemic Diam: 2.50 cm MV A velocity: 41.15 cm/s MV E/A ratio:  1.17 Oswaldo Milian MD Electronically signed by Oswaldo Milian MD Signature Date/Time:  08/28/2022/5:01:33 PM    Final    CT Angio Chest PE W/Cm &/Or Wo Cm  Result Date: 08/27/2022 CLINICAL DATA:  67 year old male with history of positive D-dimer suspected pulmonary embolism. EXAM: CT ANGIOGRAPHY CHEST WITH CONTRAST TECHNIQUE: Multidetector CT imaging of the chest was performed using the standard protocol during bolus administration of intravenous contrast. Multiplanar CT image reconstructions and MIPs were obtained to evaluate the vascular anatomy. RADIATION DOSE REDUCTION: This exam was performed according to the departmental dose-optimization program which includes automated exposure control, adjustment of the mA and/or kV according to patient size and/or use of iterative reconstruction technique. CONTRAST:  45m  OMNIPAQUE IOHEXOL 350 MG/ML SOLN COMPARISON:  Chest CTA 03/23/2022. FINDINGS: Cardiovascular: There are no filling defects within the pulmonary arterial tree to suggest pulmonary embolism. Heart size is mildly enlarged. There is no significant pericardial fluid, thickening or pericardial calcification. There is aortic atherosclerosis, as well as atherosclerosis of the great vessels of the mediastinum and the coronary arteries, including calcified atherosclerotic plaque in the left main, left anterior descending and left circumflex coronary arteries. Mediastinum/Nodes: No pathologically enlarged mediastinal or hilar lymph nodes. Esophagus is unremarkable in appearance. No axillary lymphadenopathy. Lungs/Pleura: Patchy areas of ground-glass attenuation and airspace consolidation are noted in the dependent portions of the lower lobes bilaterally. In this same distribution there are extensive areas of mucoid impaction, most evident in the left lower lobe. No pleural effusions. No definite suspicious appearing pulmonary nodules or masses are noted. Upper Abdomen: Aortic atherosclerosis. Low attenuation throughout the visualized hepatic parenchyma, indicative of a background of hepatic steatosis. Musculoskeletal: Several old healed bilateral posterior rib fractures are incidentally noted. There are no aggressive appearing lytic or blastic lesions noted in the visualized portions of the skeleton. Review of the MIP images confirms the above findings. IMPRESSION: 1. No evidence of pulmonary embolism. 2. Dependent airspace disease in the lower lobes of the lungs bilaterally, the appearance of which is most suggestive of aspiration pneumonitis and developing aspiration pneumonia. Further clinical evaluation is recommended. 3. Aortic atherosclerosis, in addition to left main and 2 vessel coronary artery disease. Please note that although the presence of coronary artery calcium documents the presence of coronary artery disease,  the severity of this disease and any potential stenosis cannot be assessed on this non-gated CT examination. Assessment for potential risk factor modification, dietary therapy or pharmacologic therapy may be warranted, if clinically indicated. 4. Mild cardiomegaly. Aortic Atherosclerosis (ICD10-I70.0). Electronically Signed   By: DVinnie LangtonM.D.   On: 08/27/2022 05:10   DG Chest Port 1 View  Result Date: 08/27/2022 CLINICAL DATA:  Shortness of breath EXAM: PORTABLE CHEST 1 VIEW COMPARISON:  07/15/2022 FINDINGS: Mild chronic elevation of the right hemidiaphragm. Heart and mediastinal contours are within normal limits. No focal opacities or effusions. No acute bony abnormality. IMPRESSION: No active disease. Electronically Signed   By: KRolm BaptiseM.D.   On: 08/27/2022 01:10    Microbiology: Results for orders placed or performed during the hospital encounter of 09/11/22  C Difficile Quick Screen w PCR reflex     Status: Abnormal   Collection Time: 09/11/22 10:29 AM   Specimen: STOOL  Result Value Ref Range Status   C Diff antigen POSITIVE (A) NEGATIVE Final   C Diff toxin POSITIVE (A) NEGATIVE Final   C Diff interpretation Toxin producing C. difficile detected.  Final    Comment:Marrian SalvageRN @ 1540 09/11/22. GILBERTL [facility]formed at [facility] [address] [place]  [place] [address]   Gastrointestinal Panel by PCR , Stool     Status: None   Collection Time: 09/11/22 10:29 AM   Specimen: Stool  Result Value Ref Range Status   Campylobacter species NOT DETECTED NOT DETECTED Final   Plesimonas shigelloides NOT DETECTED NOT DETECTED Final   Salmonella species NOT DETECTED NOT DETECTED Final   Yersinia enterocolitica NOT DETECTED NOT DETECTED Final   Vibrio species NOT DETECTED NOT DETECTED Final   Vibrio cholerae NOT DETECTED NOT DETECTED Final   Enteroaggregative E coli (EAEC) NOT DETECTED NOT DETECTED Final   Enteropathogenic E coli (EPEC) NOT DETECTED NOT  DETECTED Final   Enterotoxigenic E coli (ETEC) NOT DETECTED NOT DETECTED Final   Shiga like toxin producing E coli (STEC) NOT DETECTED NOT DETECTED Final   Shigella/Enteroinvasive E coli (EIEC) NOT DETECTED NOT DETECTED Final   Cryptosporidium NOT DETECTED NOT DETECTED Final   Cyclospora cayetanensis NOT DETECTED NOT DETECTED Final   Entamoeba histolytica NOT DETECTED NOT DETECTED Final   Giardia lamblia NOT DETECTED NOT DETECTED Final   Adenovirus F40/41 NOT DETECTED NOT DETECTED Final   Astrovirus NOT DETECTED NOT DETECTED Final   Norovirus GI/GII NOT DETECTED NOT DETECTED Final   Rotavirus A NOT DETECTED NOT DETECTED Final   Sapovirus (I, II, IV, and V) NOT DETECTED NOT DETECTED Final    Comment: Performed at [facility], [address] [place], [place] [address]    Labs: CBC: Recent Labs  Lab 09/13/22 0459 09/14/22 0417 09/15/22 0510 09/16/22 0504 09/17/22 0452  WBC 10.6* 8.2 5.8 5.8 5.0  NEUTROABS 8.1* 6.0 3.3 2.8 2.6  HGB 9.5* 9.0* 9.7* 8.5* 9.8*  HCT 30.4* 29.3* 31.9* 28.1* 31.0*  MCV 110.9* 113.6* 115.6* 114.2* 109.5*  PLT 441* 448* 421* 360 [id number]   Basic Metabolic Panel: Recent Labs  Lab 09/11/22 1029 09/11/22 1500 09/11/22 1500 09/12/22 1028 09/13/22 0459 09/14/22 0417 09/15/22 0510 09/16/22 0504 09/17/22 0452  NA 131*  --   --  132* 134* 136 135  --   --   K 3.1*  --   --  2.8* 3.5 3.9 4.2  --   --   CL 100  --   --  106 107 110 108  --   --   CO2 18*  --   --  20* 19* 19* 22  --   --   GLUCOSE 92  --   --  111* 97 96 94  --   --   BUN 7*  --   --  '9 9 11 12  '$ --   --   CREATININE 0.73  --   --  0.65 0.47* 0.51* 0.65  --   --   CALCIUM 7.4*  --   --  7.2* 7.6* 7.8* 8.3*  --   --   MG  --  1.4*   < >  --  1.6* 1.6* 1.7 1.4* 1.6*  PHOS  --  3.3  --   --  3.5 2.7 3.3 3.7  --    < > = values in this interval not displayed.   Liver Function Tests: Recent Labs  Lab 09/11/22 1029  AST 21  ALT 13  ALKPHOS 77  BILITOT 0.6  PROT 6.0*  ALBUMIN  2.4*   CBG: No results for input(s): "GLUCAP" in the last 168 hours.  Discharge time spent: greater than 30 minutes.  Signed: Alma Friendly, MD [facility] 09/17/2022

## 2022-09-18 ENCOUNTER — Telehealth: Payer: Self-pay

## 2022-09-18 NOTE — Transitions of Care (Post Inpatient/ED Visit) (Unsigned)
   09/18/2022  Name: Anthony [name] MRN: [id number] DOB: 02-19-1956  Today's TOC FU Call Status: Today's TOC FU Call Status:: Unsuccessul Call (1st Attempt) Unsuccessful Call (1st Attempt) Date: 09/18/22  Attempted to reach the patient regarding the most recent Inpatient/ED visit.  Follow Up Plan: Additional outreach attempts will be made to reach the patient to complete the Transitions of Care (Post Inpatient/ED visit) call.   Signature Juanda Crumble, [place] Direct Dial [phone]

## 2022-09-20 ENCOUNTER — Emergency Department (HOSPITAL_COMMUNITY): Payer: 59

## 2022-09-20 ENCOUNTER — Encounter (HOSPITAL_COMMUNITY): Payer: Self-pay

## 2022-09-20 ENCOUNTER — Observation Stay (HOSPITAL_COMMUNITY)
Admission: EM | Admit: 2022-09-20 | Discharge: 2022-09-22 | Disposition: A | Discharge status: Home / Self Care | Payer: 59 | Attending: Internal Medicine | Admitting: Internal Medicine

## 2022-09-20 ENCOUNTER — Other Ambulatory Visit: Payer: Self-pay

## 2022-09-20 DIAGNOSIS — Z1152 Encounter for screening for COVID-19: Secondary | ICD-10-CM | POA: Diagnosis not present

## 2022-09-20 DIAGNOSIS — Z7901 Long term (current) use of anticoagulants: Secondary | ICD-10-CM | POA: Diagnosis not present

## 2022-09-20 DIAGNOSIS — L89151 Pressure ulcer of sacral region, stage 1: Secondary | ICD-10-CM | POA: Diagnosis not present

## 2022-09-20 DIAGNOSIS — I110 Hypertensive heart disease with heart failure: Secondary | ICD-10-CM | POA: Insufficient documentation

## 2022-09-20 DIAGNOSIS — Z79899 Other long term (current) drug therapy: Secondary | ICD-10-CM | POA: Insufficient documentation

## 2022-09-20 DIAGNOSIS — F329 Major depressive disorder, single episode, unspecified: Secondary | ICD-10-CM | POA: Insufficient documentation

## 2022-09-20 DIAGNOSIS — A0472 Enterocolitis due to Clostridium difficile, not specified as recurrent: Secondary | ICD-10-CM | POA: Diagnosis not present

## 2022-09-20 DIAGNOSIS — R509 Fever, unspecified: Secondary | ICD-10-CM | POA: Insufficient documentation

## 2022-09-20 DIAGNOSIS — I5042 Chronic combined systolic (congestive) and diastolic (congestive) heart failure: Secondary | ICD-10-CM | POA: Insufficient documentation

## 2022-09-20 DIAGNOSIS — Z85828 Personal history of other malignant neoplasm of skin: Secondary | ICD-10-CM | POA: Insufficient documentation

## 2022-09-20 DIAGNOSIS — Z86711 Personal history of pulmonary embolism: Secondary | ICD-10-CM | POA: Insufficient documentation

## 2022-09-20 DIAGNOSIS — R6510 Systemic inflammatory response syndrome (SIRS) of non-infectious origin without acute organ dysfunction: Secondary | ICD-10-CM | POA: Diagnosis present

## 2022-09-20 DIAGNOSIS — I480 Paroxysmal atrial fibrillation: Secondary | ICD-10-CM | POA: Diagnosis present

## 2022-09-20 DIAGNOSIS — F419 Anxiety disorder, unspecified: Secondary | ICD-10-CM | POA: Insufficient documentation

## 2022-09-20 DIAGNOSIS — R000 Tachycardia, unspecified: Secondary | ICD-10-CM | POA: Diagnosis not present

## 2022-09-20 DIAGNOSIS — F1020 Alcohol dependence, uncomplicated: Secondary | ICD-10-CM | POA: Insufficient documentation

## 2022-09-20 DIAGNOSIS — M25561 Pain in right knee: Secondary | ICD-10-CM | POA: Insufficient documentation

## 2022-09-20 DIAGNOSIS — M25572 Pain in left ankle and joints of left foot: Secondary | ICD-10-CM | POA: Insufficient documentation

## 2022-09-20 DIAGNOSIS — R0602 Shortness of breath: Secondary | ICD-10-CM | POA: Diagnosis present

## 2022-09-20 DIAGNOSIS — F1010 Alcohol abuse, uncomplicated: Secondary | ICD-10-CM | POA: Diagnosis present

## 2022-09-20 DIAGNOSIS — Z5900 Homelessness unspecified: Secondary | ICD-10-CM | POA: Diagnosis not present

## 2022-09-20 DIAGNOSIS — I509 Heart failure, unspecified: Secondary | ICD-10-CM | POA: Insufficient documentation

## 2022-09-20 DIAGNOSIS — L8990 Pressure ulcer of unspecified site, unspecified stage: Secondary | ICD-10-CM | POA: Diagnosis present

## 2022-09-20 DIAGNOSIS — I10 Essential (primary) hypertension: Secondary | ICD-10-CM | POA: Diagnosis present

## 2022-09-20 LAB — COMPREHENSIVE METABOLIC PANEL
ALT: 33 U/L (ref 0–44)
AST: 37 U/L (ref 15–41)
Albumin: 3.2 g/dL — ABNORMAL LOW (ref 3.5–5.0)
Alkaline Phosphatase: 79 U/L (ref 38–126)
Anion gap: 8 (ref 5–15)
BUN: 9 mg/dL (ref 8–23)
CO2: 21 mmol/L — ABNORMAL LOW (ref 22–32)
Calcium: 8.7 mg/dL — ABNORMAL LOW (ref 8.9–10.3)
Chloride: 102 mmol/L (ref 98–111)
Creatinine, Ser: 0.53 mg/dL — ABNORMAL LOW (ref 0.61–1.24)
GFR, Estimated: >60 mL/min (ref >60)
Glucose, Bld: 95 mg/dL (ref 70–99)
Potassium: 4 mmol/L (ref 3.5–5.1)
Sodium: 131 mmol/L — ABNORMAL LOW (ref 135–145)
Total Bilirubin: 0.7 mg/dL (ref 0.3–1.2)
Total Protein: 7.9 g/dL (ref 6.5–8.1)

## 2022-09-20 LAB — URINALYSIS, ROUTINE W REFLEX MICROSCOPIC
Bilirubin Urine: NEGATIVE
Glucose, UA: NEGATIVE mg/dL
Hgb urine dipstick: NEGATIVE
Ketones, ur: NEGATIVE mg/dL
Leukocytes,Ua: NEGATIVE
Nitrite: NEGATIVE
Protein, ur: NEGATIVE mg/dL
Specific Gravity, Urine: 1.021 (ref 1.005–1.030)
pH: 5 (ref 5.0–8.0)

## 2022-09-20 LAB — CBC WITH DIFFERENTIAL/PLATELET
Abs Immature Granulocytes: 0.06 10*3/uL (ref 0.00–0.07)
Basophils Absolute: 0.1 10*3/uL (ref 0.0–0.1)
Basophils Relative: 1 %
Eosinophils Absolute: 0.1 10*3/uL (ref 0.0–0.5)
Eosinophils Relative: 1 %
HCT: 29.7 % — ABNORMAL LOW (ref 39.0–52.0)
Hemoglobin: 9.3 g/dL — ABNORMAL LOW (ref 13.0–17.0)
Immature Granulocytes: 1 %
Lymphocytes Relative: 21 %
Lymphs Abs: 2.2 10*3/uL (ref 0.7–4.0)
MCH: 33.9 pg (ref 26.0–34.0)
MCHC: 31.3 g/dL (ref 30.0–36.0)
MCV: 108.4 fL — ABNORMAL HIGH (ref 80.0–100.0)
Monocytes Absolute: 1.3 10*3/uL — ABNORMAL HIGH (ref 0.1–1.0)
Monocytes Relative: 13 %
Neutro Abs: 6.6 10*3/uL (ref 1.7–7.7)
Neutrophils Relative %: 63 %
Platelets: 299 10*3/uL (ref 150–400)
RBC: 2.74 MIL/uL — ABNORMAL LOW (ref 4.22–5.81)
RDW: 15.4 % (ref 11.5–15.5)
WBC: 10.3 10*3/uL (ref 4.0–10.5)
nRBC: 0 % (ref 0.0–0.2)

## 2022-09-20 LAB — RESP PANEL BY RT-PCR (RSV, FLU A&B, COVID)  RVPGX2
Influenza A by PCR: NEGATIVE
Influenza B by PCR: NEGATIVE
Resp Syncytial Virus by PCR: NEGATIVE
SARS Coronavirus 2 by RT PCR: NEGATIVE

## 2022-09-20 LAB — LACTIC ACID, PLASMA
Lactic Acid, Venous: 0.8 mmol/L (ref 0.5–1.9)
Lactic Acid, Venous: 1.1 mmol/L (ref 0.5–1.9)

## 2022-09-20 LAB — MAGNESIUM: Magnesium: 1.3 mg/dL — ABNORMAL LOW (ref 1.7–2.4)

## 2022-09-20 MED ORDER — ACETAMINOPHEN 325 MG PO TABS
650.0000 mg | ORAL_TABLET | Freq: Once | ORAL | Status: AC
  Administered 2022-09-20: 650 mg via ORAL
  Filled 2022-09-20: qty 2

## 2022-09-20 MED ORDER — SODIUM CHLORIDE (PF) 0.9 % IJ SOLN
INTRAMUSCULAR | Status: AC
  Filled 2022-09-20: qty 50

## 2022-09-20 MED ORDER — IOHEXOL 350 MG/ML SOLN
100.0000 mL | Freq: Once | INTRAVENOUS | Status: AC | PRN
  Administered 2022-09-20: 100 mL via INTRAVENOUS

## 2022-09-20 MED ORDER — LACTATED RINGERS IV BOLUS (SEPSIS)
1000.0000 mL | Freq: Once | INTRAVENOUS | Status: AC
  Administered 2022-09-20: 1000 mL via INTRAVENOUS

## 2022-09-20 MED ORDER — MAGNESIUM SULFATE 2 GM/50ML IV SOLN
2.0000 g | Freq: Once | INTRAVENOUS | Status: AC
  Administered 2022-09-20: 2 g via INTRAVENOUS
  Filled 2022-09-20: qty 50

## 2022-09-20 NOTE — ED Triage Notes (Signed)
Pt. BIB [facility] for SOB x1 year. Lungs clear with EMS. Pt. Also c/o chronic pain.

## 2022-09-20 NOTE — ED Provider Notes (Signed)
[facility] Provider Note   CSN: [id number] Arrival date & time: 09/20/22  1856     History {Add pertinent medical, surgical, social history, OB history to HPI:1} No chief complaint on file.   Anthony [name] is a 67 y.o. male.  HPI Patient presents for multiple complaints.  Medical history includes alcohol abuse, anxiety, HTN, depression, atrial fibrillation, CHF, anemia.  He had a recent hospital admission for atrial fibrillation with RVR.  He was started on Cardizem, metoprolol, and Xarelto.  During the hospitalization, he was found to have C. difficile colitis.  He was started on oral vancomycin.  Patient arrives via EMS from [facility].  EMS stated that he endorsed chronic complaints to the: Shortness of breath, arthralgias, ongoing for the last year.  Patient states that he has had exertional shortness of breath.  He endorses some recent lightheadedness.  He currently endorses pain in his neck and right knee.  He denies any recent injuries.    Home Medications Prior to Admission medications   Medication Sig Start Date End Date Taking? Authorizing Provider  acetaminophen (TYLENOL) 500 MG tablet Take 500-1,000 mg by mouth every 6 (six) hours as needed (for pain, headaches, or dental pain).    [provider]  budesonide-formoterol (SYMBICORT) 160-4.5 MCG/ACT inhaler Inhale 2 puffs into the lungs 2 (two) times daily. 08/12/22 09/12/22  [provider]  cyanocobalamin 1000 MCG tablet Take 1 tablet (1,000 mcg total) by mouth daily. 09/18/22 10/18/22  Alma Friendly, MD  diltiazem (CARDIZEM CD) 180 MG 24 hr capsule Take 1 capsule (180 mg total) by mouth daily. 09/18/22 10/18/22  Alma Friendly, MD  famotidine (PEPCID) 20 MG tablet Take 1 tablet (20 mg total) by mouth daily. 09/18/22 10/18/22  Alma Friendly, MD  ferrous sulfate 325 (65 FE) MG tablet Take 1 tablet (325 mg total) by mouth daily with breakfast. 09/18/22 10/18/22   Alma Friendly, MD  folic acid (FOLVITE) 1 MG tablet Take 1 tablet (1 mg total) by mouth daily. 09/06/22   Amin, Jeanella Flattery, MD  loperamide (IMODIUM) 2 MG capsule Take 2 capsules (4 mg total) by mouth as needed for diarrhea or loose stools. 09/05/22   Amin, Jeanella Flattery, MD  methocarbamol (ROBAXIN) 750 MG tablet Take 1 tablet (750 mg total) by mouth every 8 (eight) hours as needed for muscle spasms. 09/05/22   Amin, Jeanella Flattery, MD  metoprolol tartrate (LOPRESSOR) 25 MG tablet Take 1/2 tablet (12.5 mg total) by mouth 2 (two) times daily. 09/17/22 10/17/22  Alma Friendly, MD  midodrine (PROAMATINE) 10 MG tablet Take 1 tablet (10 mg total) by mouth 3 (three) times daily with meals. 09/05/22   Amin, Jeanella Flattery, MD  rivaroxaban (XARELTO) 20 MG TABS tablet Take 1 tablet (20 mg) by mouth daily with supper. 09/17/22 10/17/22  Alma Friendly, MD  rosuvastatin (CRESTOR) 10 MG tablet Take 1 tablet (10 mg total) by mouth daily. 09/05/22   Amin, Jeanella Flattery, MD  vancomycin (VANCOCIN) 125 MG capsule Take 1 capsule (125 mg total) by mouth 4 (four) times daily for 17 doses. 09/17/22 09/22/22  Alma Friendly, MD      Allergies    Other and Poison ivy extract    Review of Systems   Review of Systems  Respiratory:  Positive for shortness of breath.   Musculoskeletal:  Positive for arthralgias and neck pain.  Neurological:  Positive for light-headedness.  All other systems reviewed and  are negative.   Physical Exam Updated Vital Signs There were no vitals taken for this visit. Physical Exam Vitals and nursing note reviewed.  Constitutional:      General: He is not in acute distress.    Appearance: He is well-developed. He is ill-appearing (Chronically). He is not toxic-appearing or diaphoretic.  HENT:     Head: Normocephalic and atraumatic.     Right Ear: External ear normal.     Left Ear: External ear normal.     Nose: Nose normal.     Mouth/Throat:     Mouth: Mucous membranes  are moist.  Eyes:     Extraocular Movements: Extraocular movements intact.     Conjunctiva/sclera: Conjunctivae normal.  Cardiovascular:     Rate and Rhythm: Normal rate and regular rhythm.     Heart sounds: No murmur heard. Pulmonary:     Effort: Pulmonary effort is normal. No respiratory distress.     Breath sounds: Normal breath sounds. No wheezing or rales.  Chest:     Chest wall: No tenderness.  Abdominal:     General: There is no distension.     Palpations: Abdomen is soft.     Tenderness: There is no abdominal tenderness.  Musculoskeletal:        General: Tenderness (Right knee) present. No swelling.     Cervical back: Normal range of motion and neck supple. Tenderness present.     Right lower leg: Edema present.     Left lower leg: Edema present.  Skin:    General: Skin is warm and dry.     Capillary Refill: Capillary refill takes less than 2 seconds.     Coloration: Skin is not jaundiced or pale.  Neurological:     General: No focal deficit present.     Mental Status: He is alert and oriented to person, place, and time.     Cranial Nerves: No cranial nerve deficit.     Sensory: No sensory deficit.     Motor: No weakness.     Coordination: Coordination normal.  Psychiatric:        Mood and Affect: Mood normal.        Behavior: Behavior normal.     ED Results / Procedures / Treatments   Labs (all labs ordered are listed, but only abnormal results are displayed) Labs Reviewed - No data to display  EKG None  Radiology No results found.  Procedures Procedures  {Document cardiac monitor, telemetry assessment procedure when appropriate:1}  Medications Ordered in ED Medications - No data to display  ED Course/ Medical Decision Making/ A&P   {   Click here for ABCD2, HEART and other calculatorsREFRESH Note before signing :1}                          Medical Decision Making Amount and/or Complexity of Data Reviewed Labs: ordered. Radiology:  ordered. ECG/medicine tests: ordered.  Risk OTC drugs. Prescription drug management.   This patient presents to the ED for concern of shortness of breath and arthralgias, this involves an extensive number of treatment options, and is a complaint that carries with it a high risk of complications and morbidity.  The differential diagnosis includes viral illness, occult trauma, PE, pneumonia, chronic arthritis, gout, septic arthritis   Co morbidities that complicate the patient evaluation  alcohol abuse, anxiety, HTN, depression, atrial fibrillation, CHF, anemia   Additional history obtained:  Additional history obtained from EMS External records  from outside source obtained and reviewed including EMR   Lab Tests:  I Ordered, and personally interpreted labs.  The pertinent results include: Baseline anemia, no leukocytosis, hypomagnesemia with otherwise unremarkable electrolytes, normal lactate, negative COVID/flu, negative urine   Imaging Studies ordered:  I ordered imaging studies including x-ray of chest and right knee, CT of cervical spine and CTA chest I independently visualized and interpreted imaging which showed no acute findings on x-ray imaging; CT scan*** I agree with the radiologist interpretation   Cardiac Monitoring: / EKG:  The patient was maintained on a cardiac monitor.  I personally viewed and interpreted the cardiac monitored which showed an underlying rhythm of: Sinus rhythm   Consultations Obtained:  I requested consultation with the ***,  and discussed lab and imaging findings as well as pertinent plan - they recommend: ***   Problem List / ED Course / Critical interventions / Medication management  Patient presents from [facility] via EMS for multiple complaints.  He had a recent hospitalization for atrial fibrillation with RVR.  He was started on multiple medications.  Since discharge, 3 days ago, he states that he has been taking his newly prescribed  medications.  Current complaints are exertional shortness of breath, lightheadedness, right knee pain, neck pain.  He denies any recent injuries.  He does state that he has been sleeping on the ground since his hospital discharge.  On exam, he does have tenderness to his areas of pain.  No deformities or swelling appreciated.  He is able to actively flex right knee greater than 90 degrees.  Extension is intact as well.  Lungs are clear to auscultation.  Current SpO2 is normal on room air.  He is mildly tachycardic.  He does feel warm to touch.  Will initiate workup for possible infection.   Imaging studies ordered.  Patient was found to be febrile to 1 2.6 degrees on rectal temp.  Tylenol was given for antipyresis.  IV fluids were ordered.  Lab work is notable for hypomagnesemia.  Replacement magnesium was ordered.  On reassessment, patient resting with normal vital signs.  He states that he feels better simply because he has a comfortable warm place to be.  Lactate is normal and no leukocytosis is present.  I favor a viral illness.  COVID, flu, and RSV were negative.  20 pathogen respiratory panel was sent.***. I ordered medication including ***  for ***  Reevaluation of the patient after these medicines showed that the patient {resolved/improved/worsened:23923::"improved"} I have reviewed the patients home medicines and have made adjustments as needed   Social Determinants of Health:  ***   Test / Admission - Considered:  ***   {Document critical care time when appropriate:1} {Document review of labs and clinical decision tools ie heart score, Chads2Vasc2 etc:1}  {Document your independent review of radiology images, and any outside records:1} {Document your discussion with family members, caretakers, and with consultants:1} {Document social determinants of health affecting pt's care:1} {Document your decision making why or why not admission, treatments were needed:1} Final Clinical  Impression(s) / ED Diagnoses Final diagnoses:  None    Rx / DC Orders ED Discharge Orders     None

## 2022-09-21 DIAGNOSIS — R6510 Systemic inflammatory response syndrome (SIRS) of non-infectious origin without acute organ dysfunction: Secondary | ICD-10-CM | POA: Diagnosis present

## 2022-09-21 DIAGNOSIS — R0602 Shortness of breath: Secondary | ICD-10-CM | POA: Diagnosis not present

## 2022-09-21 DIAGNOSIS — A0472 Enterocolitis due to Clostridium difficile, not specified as recurrent: Secondary | ICD-10-CM

## 2022-09-21 DIAGNOSIS — Z5900 Homelessness unspecified: Secondary | ICD-10-CM

## 2022-09-21 DIAGNOSIS — I10 Essential (primary) hypertension: Secondary | ICD-10-CM | POA: Diagnosis not present

## 2022-09-21 DIAGNOSIS — I480 Paroxysmal atrial fibrillation: Secondary | ICD-10-CM

## 2022-09-21 DIAGNOSIS — F1010 Alcohol abuse, uncomplicated: Secondary | ICD-10-CM | POA: Diagnosis not present

## 2022-09-21 LAB — CBC WITH DIFFERENTIAL/PLATELET
Abs Immature Granulocytes: 0.1 10*3/uL — ABNORMAL HIGH (ref 0.00–0.07)
Basophils Absolute: 0.1 10*3/uL (ref 0.0–0.1)
Basophils Relative: 1 %
Eosinophils Absolute: 0 10*3/uL (ref 0.0–0.5)
Eosinophils Relative: 0 %
HCT: 27.1 % — ABNORMAL LOW (ref 39.0–52.0)
Hemoglobin: 8.7 g/dL — ABNORMAL LOW (ref 13.0–17.0)
Immature Granulocytes: 1 %
Lymphocytes Relative: 19 %
Lymphs Abs: 1.6 10*3/uL (ref 0.7–4.0)
MCH: 34.1 pg — ABNORMAL HIGH (ref 26.0–34.0)
MCHC: 32.1 g/dL (ref 30.0–36.0)
MCV: 106.3 fL — ABNORMAL HIGH (ref 80.0–100.0)
Monocytes Absolute: 1.1 10*3/uL — ABNORMAL HIGH (ref 0.1–1.0)
Monocytes Relative: 13 %
Neutro Abs: 5.4 10*3/uL (ref 1.7–7.7)
Neutrophils Relative %: 66 %
Platelets: 305 10*3/uL (ref 150–400)
RBC: 2.55 MIL/uL — ABNORMAL LOW (ref 4.22–5.81)
RDW: 14.9 % (ref 11.5–15.5)
WBC: 8.3 10*3/uL (ref 4.0–10.5)
nRBC: 0 % (ref 0.0–0.2)

## 2022-09-21 LAB — BASIC METABOLIC PANEL
Anion gap: 6 (ref 5–15)
BUN: 7 mg/dL — ABNORMAL LOW (ref 8–23)
CO2: 22 mmol/L (ref 22–32)
Calcium: 8.3 mg/dL — ABNORMAL LOW (ref 8.9–10.3)
Chloride: 103 mmol/L (ref 98–111)
Creatinine, Ser: 0.43 mg/dL — ABNORMAL LOW (ref 0.61–1.24)
GFR, Estimated: >60 mL/min (ref >60)
Glucose, Bld: 89 mg/dL (ref 70–99)
Potassium: 3.5 mmol/L (ref 3.5–5.1)
Sodium: 131 mmol/L — ABNORMAL LOW (ref 135–145)

## 2022-09-21 LAB — RESPIRATORY PANEL BY PCR

## 2022-09-21 LAB — MAGNESIUM: Magnesium: 1.5 mg/dL — ABNORMAL LOW (ref 1.7–2.4)

## 2022-09-21 LAB — PROCALCITONIN: Procalcitonin: 0.31 ng/mL

## 2022-09-21 LAB — C-REACTIVE PROTEIN: CRP: 14.9 mg/dL — ABNORMAL HIGH (ref <1.0)

## 2022-09-21 LAB — SEDIMENTATION RATE: Sed Rate: 88 mm/hr — ABNORMAL HIGH (ref 0–16)

## 2022-09-21 LAB — ETHANOL: Alcohol, Ethyl (B): <10 mg/dL (ref <10)

## 2022-09-21 MED ORDER — METOPROLOL TARTRATE 12.5 MG HALF TABLET
12.5000 mg | ORAL_TABLET | Freq: Two times a day (BID) | ORAL | Status: DC
  Administered 2022-09-21 – 2022-09-22 (×3): 12.5 mg via ORAL
  Filled 2022-09-21 (×3): qty 1

## 2022-09-21 MED ORDER — THIAMINE HCL 100 MG/ML IJ SOLN
100.0000 mg | Freq: Every day | INTRAMUSCULAR | Status: DC
  Filled 2022-09-21: qty 2

## 2022-09-21 MED ORDER — RIVAROXABAN 20 MG PO TABS
20.0000 mg | ORAL_TABLET | Freq: Every day | ORAL | Status: DC
  Administered 2022-09-21: 20 mg via ORAL
  Filled 2022-09-21: qty 1

## 2022-09-21 MED ORDER — LORAZEPAM 1 MG PO TABS: 1.0000 mg | ORAL_TABLET | ORAL | Status: DC | PRN

## 2022-09-21 MED ORDER — ONDANSETRON HCL 4 MG PO TABS: 4.0000 mg | ORAL_TABLET | Freq: Four times a day (QID) | ORAL | Status: DC | PRN

## 2022-09-21 MED ORDER — ACETAMINOPHEN 650 MG RE SUPP: 650.0000 mg | Freq: Four times a day (QID) | RECTAL | Status: DC | PRN

## 2022-09-21 MED ORDER — VANCOMYCIN HCL 125 MG PO CAPS: 125.0000 mg | ORAL_CAPSULE | Freq: Four times a day (QID) | ORAL | Status: DC

## 2022-09-21 MED ORDER — ADULT MULTIVITAMIN W/MINERALS CH
1.0000 | ORAL_TABLET | Freq: Every day | ORAL | Status: DC
  Administered 2022-09-21 – 2022-09-22 (×2): 1 via ORAL
  Filled 2022-09-21 (×2): qty 1

## 2022-09-21 MED ORDER — FAMOTIDINE 20 MG PO TABS
20.0000 mg | ORAL_TABLET | Freq: Every day | ORAL | Status: DC
  Administered 2022-09-21 – 2022-09-22 (×2): 20 mg via ORAL
  Filled 2022-09-21 (×2): qty 1

## 2022-09-21 MED ORDER — ACETAMINOPHEN 325 MG PO TABS
650.0000 mg | ORAL_TABLET | Freq: Four times a day (QID) | ORAL | Status: DC | PRN
  Administered 2022-09-21 – 2022-09-22 (×4): 650 mg via ORAL
  Filled 2022-09-21 (×4): qty 2

## 2022-09-21 MED ORDER — ACETAMINOPHEN 325 MG PO TABS
650.0000 mg | ORAL_TABLET | Freq: Once | ORAL | Status: AC
  Administered 2022-09-21: 650 mg via ORAL
  Filled 2022-09-21: qty 2

## 2022-09-21 MED ORDER — ONDANSETRON HCL 4 MG/2ML IJ SOLN: 4.0000 mg | Freq: Four times a day (QID) | INTRAMUSCULAR | Status: DC | PRN

## 2022-09-21 MED ORDER — THIAMINE MONONITRATE 100 MG PO TABS
100.0000 mg | ORAL_TABLET | Freq: Every day | ORAL | Status: DC
  Administered 2022-09-21 – 2022-09-22 (×2): 100 mg via ORAL
  Filled 2022-09-21 (×2): qty 1

## 2022-09-21 MED ORDER — DILTIAZEM HCL ER COATED BEADS 180 MG PO CP24
180.0000 mg | ORAL_CAPSULE | Freq: Every day | ORAL | Status: DC
  Administered 2022-09-21 – 2022-09-22 (×2): 180 mg via ORAL
  Filled 2022-09-21 (×2): qty 1

## 2022-09-21 MED ORDER — MOMETASONE FURO-FORMOTEROL FUM 200-5 MCG/ACT IN AERO
2.0000 | INHALATION_SPRAY | Freq: Two times a day (BID) | RESPIRATORY_TRACT | Status: DC
  Administered 2022-09-21 – 2022-09-22 (×3): 2 via RESPIRATORY_TRACT
  Filled 2022-09-21: qty 8.8

## 2022-09-21 MED ORDER — ROSUVASTATIN CALCIUM 10 MG PO TABS
10.0000 mg | ORAL_TABLET | Freq: Every day | ORAL | Status: DC
  Administered 2022-09-21 – 2022-09-22 (×2): 10 mg via ORAL
  Filled 2022-09-21 (×2): qty 1

## 2022-09-21 MED ORDER — VANCOMYCIN HCL 125 MG PO CAPS
125.0000 mg | ORAL_CAPSULE | Freq: Four times a day (QID) | ORAL | Status: DC
  Administered 2022-09-21 – 2022-09-22 (×5): 125 mg via ORAL
  Filled 2022-09-21 (×6): qty 1

## 2022-09-21 MED ORDER — METHOCARBAMOL 500 MG PO TABS: 750.0000 mg | ORAL_TABLET | Freq: Three times a day (TID) | ORAL | Status: DC | PRN

## 2022-09-21 MED ORDER — FOLIC ACID 1 MG PO TABS
1.0000 mg | ORAL_TABLET | Freq: Every day | ORAL | Status: DC
  Administered 2022-09-21: 1 mg via ORAL
  Filled 2022-09-21: qty 1

## 2022-09-21 MED ORDER — FOLIC ACID 1 MG PO TABS
1.0000 mg | ORAL_TABLET | Freq: Every day | ORAL | Status: DC
  Administered 2022-09-21 – 2022-09-22 (×2): 1 mg via ORAL
  Filled 2022-09-21 (×2): qty 1

## 2022-09-21 NOTE — Assessment & Plan Note (Addendum)
-  Takes Cardizem, and metoprolol -Also appears to have been discharged on Midodrine in mid Feb - BP normal today so will hold -If midodrine is needed, other BP meds should likely be stopped

## 2022-09-21 NOTE — Assessment & Plan Note (Addendum)
-  Continue PO vanc through 3/5. -Scant diarrhea in ED since presentation.

## 2022-09-21 NOTE — Assessment & Plan Note (Addendum)
-  Continue metoprolol -Continue Xarelto for now, his HAS-BLED score is 2.  Does have h/o PE in past as well.

## 2022-09-21 NOTE — Assessment & Plan Note (Addendum)
-  Continue CIWA for withdrawal prevention

## 2022-09-21 NOTE — Hospital Course (Signed)
Patient with h/o homelessness, ETOH dependence, afib on Eliquis, chronic diastolic CHF, prior PE, and HTN who presented with SOB, fever.  He was recently hospitalized x 2 - once in early February for aspiration PNA and then again from 2/23-29 for afib with RVR as well as C diff colitis and acute on chronic CHF.  Patient was persistently febrile in the ER with mild tachycardia.  Chest CT with improving infiltrates, but possible ongoing PNA?  Antibiotics were held due to recent h/o C difficile infection.  He remains on PO Vanc through 3/5.  Blood cultures are pending.

## 2022-09-21 NOTE — Assessment & Plan Note (Addendum)
-  Pt with persistent fevers in ED, mild tachycardia 100-110. -Unclear source of infection - recently hospitalized with aspiration PNA (imaging currently with persistent but improved infiltrates) and with C diff colitis (reports improving diarrhea) -His biggest complaint is generalized arthralgias -Mildly elevated ESR, CRP -Somewhat concerning for rheumatologic disease -UA neg -COVID, FLU, RSV, and 20 virus panel RVP are all negative. -Minimally elevated procalcitonin -Monitor fever and for development of localizing signs / symptoms -Will hold off on empiric ABx as patient just had C.Diff colitis this past week (is actually still on treatment for this) - at least until we have a stronger suspicion of bacterial infection. -Blood cultures pending. -PT/OT consults

## 2022-09-21 NOTE — Assessment & Plan Note (Signed)
-  TOC team consulted, reports that patient will need to return to homelessness with [facility] SW assistance to hopefully find ALF housing for the patient

## 2022-09-21 NOTE — H&P (Signed)
History and Physical    Patient: Anthony [name] [id number] DOB: 1955/10/20 DOA: 09/20/2022 DOS: the patient was seen and examined on 09/21/2022 PCP: Pcp, No  Patient coming from: Homeless  Chief Complaint:  Chief Complaint  Patient presents with   Shortness of Breath   HPI: Anthony [name] is a 67 y.o. male with medical history significant of EtOH abuse, homelessness, PAF on Eliquis, prior PE, HTN.  Pt with hospitalization in early Feb (2/8) for Aspiration PNA.  Pt with second hospitalization in Feb 2/23-2/29 for A.Fib RVR, that admit complicated by C.Diff colitis and CHF decompensation.  Pt discharged on PO vanc which he reports taking.  Pt presents to ED with c/o DOE, R knee pain, neck pain, and lightheadedness.  SOB ongoing for past 1 year.  Denies any recent injuries.  Knee pain and neck pain also seem to be chronic by report.  Unfortunately pt febrile persistently in ED.  Regarding diarrhea: "some days I do, some days I dont"  No BM since arrival to ED earlier in evening.  Review of Systems: As mentioned in the history of present illness. All other systems reviewed and are negative. Past Medical History:  Diagnosis Date   Actinic keratosis 11/27/2016   Alcohol abuse with alcohol-induced mood disorder ([place]) 07/18/2018   Alcohol use disorder, severe, dependence ([place]) 09/16/2006   05/22/2015 Argumentative, belligerent and verbally abusive to staff per his note from [facility])    Anxiety state 04/25/2018   Aortic atherosclerosis ([place]) 08/27/2022   Chronic low back pain 09/16/2006   Followed by NS.  Per his chart from [place], he fell off two storyhouse roof while cleaning his brother's gutters and sustained compression fracture of L3, L4 and L5 and fracture of right transverse process at L3 and nondisplaced fracture of left glenoid rim many years ago. He had multiple imaging in [place] including Lumbar MRI in 2016 which showed moderate to  severe spinal canal stenos   Delirium tremens ([place]) 09/01/2017   Depression    Dry eye 11/23/2016   Foreign body in middle portion of esophagus 05/19/2022   Hiatal hernia 09/14/2016   Status Collis gastroplasty and Nissen's fundoplication on [id number] in [place]   History of delirium tremons 07/22/2020   DTs during 10/2016 08/2017. And 12/2017 admissions    History of pulmonary embolus (PE) 11/02/2016   Unprovoked 11/01/2016. Xarelto from 11/01/16 to 09/08/2017.   Hydrocele    Surgically corrected   Hypertension    Hypoalbuminemia due to protein-calorie malnutrition ([place]) 07/22/2020   Lumbar compression fracture (HCC)    Multiple rib fractures 07/22/2019   Left rib details XR: left ribs demonstrate multiple remote rib   Pancytopenia ([place])    Rhabdomyolysis 07/22/2020   Skin cancer    exciced 2017   Tinea versicolor 06/08/2013   Tobacco use disorder 07/22/2020   Tooth infection 04/25/2018   Trigger finger, acquired 11/18/2012   Ulnar tunnel syndrome of right wrist 11/08/2012   Past Surgical History:  Procedure Laterality Date   BIOPSY  05/21/2022   Procedure: BIOPSY;  Surgeon: [name], MD;  Location: [facility] ENDOSCOPY;  Service: Gastroenterology;;   CATARACT EXTRACTION     ESOPHAGOGASTRODUODENOSCOPY (EGD) WITH PROPOFOL N/A 05/21/2022   Procedure: ESOPHAGOGASTRODUODENOSCOPY (EGD) WITH PROPOFOL;  Surgeon: [name], MD;  Location: [place];  Service: Gastroenterology;  Laterality: N/A;   FOREIGN BODY REMOVAL N/A 05/21/2022   Procedure: FOREIGN BODY REMOVAL;  Surgeon: [name], MD;  Location: [place];  Service: Gastroenterology;  Laterality: N/A;   HIATAL HERNIA REPAIR  2016   HYDROCELE EXCISION / REPAIR  2010   SKIN CANCER EXCISION Left    Excised 2017   Social History:  reports that he has never smoked. His smokeless tobacco use includes snuff. He reports current alcohol use of about 43.0 standard drinks of alcohol per week. He reports current drug use.  Drug: Marijuana.  Allergies  Allergen Reactions   Other Itching and Other (See Comments)    Seasonal allergies- Itchy eyes, runny nose, congestion   Poison Ivy Extract Rash    Family History  Problem Relation Age of Onset   Heart attack Father        died at 36 years   Dementia Father    Stroke Father    Cancer Sister    Colon cancer Neg Hx    Colon polyps Neg Hx    Esophageal cancer Neg Hx    Stomach cancer Neg Hx    Rectal cancer Neg Hx     Prior to Admission medications   Medication Sig Start Date End Date Taking? Authorizing Provider  acetaminophen (TYLENOL) 500 MG tablet Take 500-1,000 mg by mouth every 6 (six) hours as needed (for pain, headaches, or dental pain).   Yes [provider]  budesonide-formoterol (SYMBICORT) 160-4.5 MCG/ACT inhaler Inhale 2 puffs into the lungs 2 (two) times daily.   Yes [provider]  cyanocobalamin 1000 MCG tablet Take 1 tablet (1,000 mcg total) by mouth daily. 09/18/22 10/18/22 Yes Alma Friendly, MD  diltiazem (CARDIZEM CD) 180 MG 24 hr capsule Take 1 capsule (180 mg total) by mouth daily. 09/18/22 10/18/22 Yes Alma Friendly, MD  famotidine (PEPCID) 20 MG tablet Take 1 tablet (20 mg total) by mouth daily. 09/18/22 10/18/22 Yes Alma Friendly, MD  rivaroxaban (XARELTO) 20 MG TABS tablet Take 1 tablet (20 mg) by mouth daily with supper. 09/17/22 10/17/22 Yes Alma Friendly, MD  budesonide-formoterol [facility]) 160-4.5 MCG/ACT inhaler Inhale 2 puffs into the lungs 2 (two) times daily. 08/12/22 09/12/22  [provider]  ferrous sulfate 325 (65 FE) MG tablet Take 1 tablet (325 mg total) by mouth daily with breakfast. 09/18/22 10/18/22  Alma Friendly, MD  folic acid (FOLVITE) 1 MG tablet Take 1 tablet (1 mg total) by mouth daily. 09/06/22   Amin, Jeanella Flattery, MD  loperamide (IMODIUM) 2 MG capsule Take 2 capsules (4 mg total) by mouth as needed for diarrhea or loose stools. 09/05/22   Amin, Jeanella Flattery,  MD  methocarbamol (ROBAXIN) 750 MG tablet Take 1 tablet (750 mg total) by mouth every 8 (eight) hours as needed for muscle spasms. 09/05/22   Amin, Jeanella Flattery, MD  metoprolol tartrate (LOPRESSOR) 25 MG tablet Take 1/2 tablet (12.5 mg total) by mouth 2 (two) times daily. 09/17/22 10/17/22  Alma Friendly, MD  midodrine (PROAMATINE) 10 MG tablet Take 1 tablet (10 mg total) by mouth 3 (three) times daily with meals. 09/05/22   Amin, Jeanella Flattery, MD  rosuvastatin (CRESTOR) 10 MG tablet Take 1 tablet (10 mg total) by mouth daily. 09/05/22   Amin, Jeanella Flattery, MD  vancomycin (VANCOCIN) 125 MG capsule Take 1 capsule (125 mg total) by mouth 4 (four) times daily for 17 doses. 09/17/22 09/22/22  Alma Friendly, MD    Physical Exam: Vitals:   09/21/22 0145 09/21/22 0239 09/21/22 0330 09/21/22 0415  BP: 124/78  131/70 (!) 141/92  Pulse: 93  (!) 103 94  Resp: Marland Kitchen)  25  (!) 28 (!) 28  Temp:  (!) 101.4 F (38.6 C)    TempSrc:  Oral    SpO2: 97%  96% 96%   Constitutional: Chronically ill appearing Respiratory: clear to auscultation bilaterally, no wheezing, no crackles. Normal respiratory effort. No accessory muscle use.  Cardiovascular: Tachycardic, BLE edema present Abdomen: no tenderness, no masses palpated. No hepatosplenomegaly. Bowel sounds positive.  Musculoskeletal: R knee TTP, but no obvious knee joint edema, erythema Neurologic: CN 2-12 grossly intact. Sensation intact, DTR normal. Strength 5/5 in all 4.  Psychiatric: Normal judgment and insight. Alert and oriented x 3. Normal mood.   Data Reviewed:       Latest Ref Rng & Units 09/20/2022    8:09 PM 09/17/2022    4:52 AM 09/16/2022    5:04 AM  CBC  WBC 4.0 - 10.5 K/uL 10.3  5.0  5.8   Hemoglobin 13.0 - 17.0 g/dL 9.3  9.8  8.5   Hematocrit 39.0 - 52.0 % 29.7  31.0  28.1   Platelets 150 - 400 K/uL 299  383  360       Latest Ref Rng & Units 09/20/2022    8:09 PM 09/15/2022    5:10 AM 09/14/2022    4:17 AM  CMP  Glucose 70 - 99  mg/dL 95  94  96   BUN 8 - 23 mg/dL '9  12  11   '$ Creatinine 0.61 - 1.24 mg/dL 0.53  0.65  0.51   Sodium 135 - 145 mmol/L 131  135  136   Potassium 3.5 - 5.1 mmol/L 4.0  4.2  3.9   Chloride 98 - 111 mmol/L 102  108  110   CO2 22 - 32 mmol/L '21  22  19   '$ Calcium 8.9 - 10.3 mg/dL 8.7  8.3  7.8   Total Protein 6.5 - 8.1 g/dL 7.9     Total Bilirubin 0.3 - 1.2 mg/dL 0.7     Alkaline Phos 38 - 126 U/L 79     AST 15 - 41 U/L 37     ALT 0 - 44 U/L 33      CT chest: IMPRESSION: 1. No definite evidence of pulmonary embolism. Examination is limited due to respiratory motion artifact. 2. Scattered airspace opacities in the lower lobes bilaterally, possible atelectasis or infiltrate, improved from the previous exam. 3. Small hiatal hernia. 4. Cardiomegaly with coronary artery calcifications. 5. Aortic atherosclerosis.  Assessment and Plan: * SIRS (systemic inflammatory response syndrome) (HCC) Pt with persistent fevers in ED, mild tachycardia 100-110. -Unclear source of infection. -Perhaps PNA? Hypoxic with ambulation, has SOB but his SOB is ongoing for at least a year now.  CT scan reveals B patchy infiltrate but this looks IMPROVED compared to admission early last month for aspiration PNA. -Knee pain, but has good ROM of knee, no the usual presentation for septic arthritis. -UA neg -COVID, FLU, RSV, and 20 virus panel RVP are all negative. Check procalcitonin Monitor fever Tylenol PRN fever Monitor for development of localizing signs / symptoms Would prefer to hold off on empiric ABx as patient just had C.Diff colitis this past week (is actually still on treatment for this).  At least until we have a stronger suspicion of bacterial infection. BCx pending. Tele monitor for tachycardia for the moment (given h/o PAF).  C. difficile colitis Continue PO vanc through 3/5. No diarrhea in ED since presentation.  Alcohol abuse CIWA for withdrawal prevention  Hypomagnesemia 2g replacement in  ED Repeating Mg level  Paroxysmal atrial fibrillation (HCC) Cont cardizem and metoprolol Continue Xarelto for now, his HAS-BLED score is 2.  Does have h/o PE in past as well.  HTN (hypertension) History of HTN, Takes Cardizem, and metoprolol Also looks like he got discharged on Midodrine in mid Feb. BP in ED today is running 0000000 systolic Hold midodrine for the moment.      Advance Care Planning:   Code Status: Full Code  Consults: None  Family Communication: No family in room  Severity of Illness: The appropriate patient status for this patient is OBSERVATION. Observation status is judged to be reasonable and necessary in order to provide the required intensity of service to ensure the patient's safety. The patient's presenting symptoms, physical exam findings, and initial radiographic and laboratory data in the context of their medical condition is felt to place them at decreased risk for further clinical deterioration. Furthermore, it is anticipated that the patient will be medically stable for discharge from the hospital within 2 midnights of admission.   Author: Etta Quill., DO 09/21/2022 5:27 AM  For on call review [email or web address].

## 2022-09-21 NOTE — TOC Initial Note (Signed)
Transition of Care [facility]) - Initial/Assessment Note    Patient Details  Name: Anthony [name] MRN: [id number] Date of Birth: [birth year]  Transition of Care [facility]) CM/SW Contact:    Vassie Moselle, LCSW Phone Number: 09/21/2022, 1:39 PM  Clinical Narrative:                 Met with pt at bedside to discuss SDOH and SA concerns. Pt shares he currently stays at the [facility] and recently became established with a case Freight forwarder. Pt is agreeable to SA resources and resources for housing, food, and transportation placed on discharge instructions. Resources placed on AVS.  Pt shares he would like to be placed in an ALF. CSW shared with pt that this is LTC placement which requires a private pay component. Due to this hospital is unable to place pt's in LTC ALF. Pt shares he receives $950 a month and would be willing to sign over a 3rd of the check for rent. CSW shared that for ALF there are other services being provided and he would be required to sign over the majority of his monthly check. CSW encouraged pt to speak with CM at [facility] to assist with ALF placement following discharge.   Expected Discharge Plan: Homeless Shelter Barriers to Discharge: Continued Medical Work up   Patient Goals and CMS Choice Patient states their goals for this hospitalization and ongoing recovery are:: To go to an ALF CMS Medicare.gov Compare Post Acute Care list provided to:: Patient Choice offered to / list presented to : Patient [place] ownership interest in [facility].provided to:: Patient    Expected Discharge Plan and Services In-house Referral: Clinical Social Work Discharge Planning Services: CM Consult Post Acute Care Choice: Nursing Home Living arrangements for the past 2 months: Homeless Shelter                 DME Arranged: N/A DME Agency: NA                  Prior Living Arrangements/Services Living arrangements for the past 2 months: [place] with:: Self Patient  language and need for interpreter reviewed:: Yes Do you feel safe going back to the place where you live?: Yes      Need for Family Participation in Patient Care: No (Comment) Care giver support system in place?: No (comment) Current home services: DME (3 in 1) Criminal Activity/Legal Involvement Pertinent to Current Situation/Hospitalization: No - Comment as needed  Activities of Daily Living      Permission Sought/Granted   Permission granted to share information with : No              Emotional Assessment Appearance:: Appears stated age Attitude/Demeanor/Rapport: Engaged, Self-Confident Affect (typically observed): Hopeful Orientation: : Oriented to Self, Oriented to Place, Oriented to  Time, Oriented to Situation Alcohol / Substance Use: Alcohol Use Psych Involvement: No (comment)  Admission diagnosis:  Homelessness [Z59.00] SIRS (systemic inflammatory response syndrome) (HCC) [R65.10] Fever in adult [R50.9] Patient Active Problem List   Diagnosis Date Noted   SIRS (systemic inflammatory response syndrome) ([place]) 09/21/2022   Atrial fibrillation ([place]) 09/12/2022   Moderate protein malnutrition ([place]) 09/11/2022   Chronic diarrhea 09/11/2022   C. difficile colitis 09/11/2022   Aspiration pneumonia ([place]) 08/27/2022   Aortic atherosclerosis ([place]) 08/27/2022   Depression, unspecified 08/09/2022   Hyponatremia 06/01/2022   Non compliance w medication regimen 05/31/2022   Delirium 05/25/2022   Stricture and stenosis of esophagus 05/21/2022  Gastritis and gastroduodenitis 05/21/2022   Loose stools 05/20/2022   Malnutrition of moderate degree 05/20/2022   Acute on chronic congestive heart failure (HCC)    Leukocytosis 05/19/2022   Sepsis ([place]) 05/19/2022   Cellulitis of left leg, possible 05/19/2022    Class: Question of   Moniliasis, interdigital 05/19/2022   Open toe wound, right foot, medial fourth digit 05/19/2022   Malnutrition ([place]) 05/19/2022   Alcohol abuse  03/24/2022   Dyslipidemia 03/24/2022   Acute on chronic diastolic CHF (congestive heart failure) ([place]) 03/24/2022   Acute respiratory failure with hypoxia (HCC) 03/23/2022   Generalized weakness    Atrial fibrillation with RVR ([place]) 03/15/2022   Hypokalemia 03/15/2022   Iron deficiency anemia 10/16/2020   Cough    Hypomagnesemia    Protein-calorie malnutrition, severe 08/22/2020   Alcohol use disorder, severe, in controlled environment ([place])    Pressure injury of skin 08/18/2020   Urinary tract infection without hematuria    Severe sepsis ([place]) 08/14/2020   Paroxysmal atrial fibrillation ([place]) 08/13/2020   Rhabdomyolysis 07/22/2020   Cerebral ventriculomegaly 07/22/2020   Hemoglobin decreased 07/22/2020   History of delirium tremons 07/22/2020   Hypoalbuminemia due to protein-calorie malnutrition ([place]) 07/22/2020   Tobacco use disorder 07/22/2020   Alcohol withdrawal delirium ([place])    Dehydration    Alcohol abuse with alcohol-induced mood disorder ([place]) 07/18/2018   Depression 05/26/2018   Anxiety state 04/25/2018   Need for immunization against influenza 04/25/2018   Alcohol withdrawal ([place]) 09/01/2017   DOE (dyspnea on exertion) 11/27/2016   Actinic keratosis 11/27/2016   Macrocytic anemia 11/23/2016   History of pulmonary embolus (PE) 11/02/2016   Hiatal hernia 09/14/2016   Skin cancer 08/13/2016   Poor social situation 06/09/2013   Tinea versicolor 06/08/2013   Back pain 05/07/2013   Seborrheic dermatitis of scalp 11/08/2012   HTN (hypertension) 04/11/2012   Alcohol use disorder, severe, dependence ([place]) 09/16/2006   PCP:  Pcp, No Pharmacy:   [facility] [place], [place] [address] [place] [address] Phone: [phone] Fax: [phone]     Social Determinants of Health (SDOH) Social History: SDOH Screenings   Food Insecurity: Food Insecurity Present (09/11/2022)   Housing: High Risk (09/11/2022)  Transportation Needs: Unmet Transportation Needs (09/11/2022)  Utilities: Not At Risk (09/11/2022)  Depression (PHQ2-9): Medium Risk (10/16/2020)  Tobacco Use: High Risk (09/20/2022)   SDOH Interventions:     Readmission Risk Interventions    08/28/2022    2:00 PM 08/27/2022   10:30 AM 08/27/2022   10:08 AM  Readmission Risk Prevention Plan  Transportation Screening Complete Complete   Medication Review Press photographer)  Complete   PCP or Specialist appointment within 3-5 days of discharge Complete Complete Complete  HRI or Home Care Consult  Complete Complete  SW Recovery Care/Counseling Consult Complete Complete Complete  Palliative Care Screening Not Applicable Not Applicable Complete  Skilled Nursing Facility Not Applicable Not Applicable Not Applicable

## 2022-09-21 NOTE — Assessment & Plan Note (Addendum)
-  Repleted -Repeating Mg level in AM

## 2022-09-21 NOTE — Progress Notes (Signed)
Progress Note   Patient: Anthony [name] [id number] DOB: April 14, 1956 DOA: 09/20/2022     0 DOS: the patient was seen and examined on 09/21/2022   Brief hospital course: Patient with h/o homelessness, ETOH dependence, afib on Eliquis, chronic diastolic CHF, prior PE, and HTN who presented with SOB, fever.  He was recently hospitalized x 2 - once in early February for aspiration PNA and then again from 2/23-29 for afib with RVR as well as C diff colitis and acute on chronic CHF.  Patient was persistently febrile in the ER with mild tachycardia.  Chest CT with improving infiltrates, but possible ongoing PNA?  Antibiotics were held due to recent h/o C difficile infection.  He remains on PO Vanc through 3/5.  Blood cultures are pending.  Assessment and Plan: * SIRS (systemic inflammatory response syndrome) (HCC) -Pt with persistent fevers in ED, mild tachycardia 100-110. -Unclear source of infection - recently hospitalized with aspiration PNA (imaging currently with persistent but improved infiltrates) and with C diff colitis (reports improving diarrhea) -His biggest complaint is generalized arthralgias -Mildly elevated ESR, CRP -Somewhat concerning for rheumatologic disease -UA neg -COVID, FLU, RSV, and 20 virus panel RVP are all negative. -Minimally elevated procalcitonin -Monitor fever and for development of localizing signs / symptoms -Will hold off on empiric ABx as patient just had C.Diff colitis this past week (is actually still on treatment for this) - at least until we have a stronger suspicion of bacterial infection. -Blood cultures pending. -PT/OT consults  C. difficile colitis -Continue PO vanc through 3/5. -No diarrhea in ED since presentation.  Alcohol abuse -Continue CIWA for withdrawal prevention  Hypomagnesemia -Repleted -Repeating Mg level in AM  Paroxysmal atrial fibrillation (HCC) -Continue cardizem and metoprolol -Continue Xarelto for now, his HAS-BLED score is 2.   Does have h/o PE in past as well.  HTN (hypertension) -Takes Cardizem, and metoprolol -Also appears to have been discharged on Midodrine in mid Feb - BP normal today so will hold -If midodrine is needed, other BP meds should likely be stopped  Homelessness -TOC team consult      Subjective: Patient reports that he is feeling better today.  His chronic SOB is present but not worse than baseline.  His only significant complaint is RLE pain from the knee to ankle, although on exam he complained more about L ankle pain when the blanket was moved.  He also reports neck stiffness limiting his ability to look left or right significantly, although on further discussion he is having diffuse spinal stiffness that he thinks is the cause.   Physical Exam: Vitals:   09/21/22 0330 09/21/22 0415 09/21/22 0705 09/21/22 0811  BP: 131/70 (!) 141/92 100/73   Pulse: (!) 103 94 80   Resp: (!) 28 (!) 28 20   Temp:   98.7 F (37.1 C)   TempSrc:   Oral   SpO2: 96% 96% 98% 99%   General:  Appears calm and comfortable and is in NAD, disheveled Eyes:   EOMI, normal lids, iris, does not make good eye contact ENT:  grossly normal hearing, lips & tongue, mmm Neck:  no LAD, masses or thyromegaly Cardiovascular:  RRR, no m/r/g.  Respiratory:   Mild scattered rhonchi.  Mildly increased respiratory effort. Abdomen:  soft, NT, ND Skin:  no rash or induration seen on limited exam Musculoskeletal:  grossly normal tone BUE/BLE, good ROM, no bony abnormality; B pedal edema, 2+, does not extend above the lower calf Lower extremity: Limited foot  exam with no ulcerations.  2+ distal pulses. Psychiatric:  blunted mood and affect, speech fluent and appropriate, AOx3 Neurologic:  CN 2-12 grossly intact, moves all extremities in coordinated fashion   Radiological Exams on Admission: Independently reviewed - see discussion in A/P where applicable  CT Angio Chest PE W and/or Wo Contrast  Result Date:  09/20/2022 CLINICAL DATA:  Pulmonary embolism suspected high probability. EXAM: CT ANGIOGRAPHY CHEST WITH CONTRAST TECHNIQUE: Multidetector CT imaging of the chest was performed using the standard protocol during bolus administration of intravenous contrast. Multiplanar CT image reconstructions and MIPs were obtained to evaluate the vascular anatomy. RADIATION DOSE REDUCTION: This exam was performed according to the departmental dose-optimization program which includes automated exposure control, adjustment of the mA and/or kV according to patient size and/or use of iterative reconstruction technique. CONTRAST:  118m OMNIPAQUE IOHEXOL 350 MG/ML SOLN COMPARISON:  08/27/2022. FINDINGS: Cardiovascular: The heart is mildly enlarged and there is no pericardial effusion. Multi-vessel coronary artery calcifications are noted. There is mild atherosclerotic calcification of the aorta without evidence of aneurysm. The pulmonary trunk is normal in caliber. No definite evidence of pulmonary embolism. Evaluation is limited due to respiratory motion artifact. Mediastinum/Nodes: No mediastinal, hilar, or axillary lymphadenopathy. The thyroid gland, trachea, and esophagus are within normal limits. There is a small hiatal hernia. Lungs/Pleura: Scattered airspace disease in the lower lobes bilaterally, possible atelectasis or infiltrate, improved from the prior exam. No effusion or pneumothorax. Upper Abdomen: Gastric surgical changes are noted. No acute abnormality. Musculoskeletal: Old healed rib fractures are noted bilaterally. Degenerative changes are present in the thoracic spine. No acute osseous abnormality. Review of the MIP images confirms the above findings. IMPRESSION: 1. No definite evidence of pulmonary embolism. Examination is limited due to respiratory motion artifact. 2. Scattered airspace opacities in the lower lobes bilaterally, possible atelectasis or infiltrate, improved from the previous exam. 3. Small hiatal  hernia. 4. Cardiomegaly with coronary artery calcifications. 5. Aortic atherosclerosis. Electronically Signed   By: LBrett FairyM.D.   On: 09/20/2022 23:51   CT Cervical Spine Wo Contrast  Result Date: 09/20/2022 CLINICAL DATA:  Neck pain EXAM: CT CERVICAL SPINE WITHOUT CONTRAST TECHNIQUE: Multidetector CT imaging of the cervical spine was performed without intravenous contrast. Multiplanar CT image reconstructions were also generated. RADIATION DOSE REDUCTION: This exam was performed according to the departmental dose-optimization program which includes automated exposure control, adjustment of the mA and/or kV according to patient size and/or use of iterative reconstruction technique. COMPARISON:  Cervical spine radiographs dated 05/18/2021 FINDINGS: Alignment: Normal cervical lordosis. Skull base and vertebrae: No acute fracture. No primary bone lesion or focal pathologic process. Soft tissues and spinal canal: No prevertebral fluid or swelling. No visible canal hematoma. Disc levels: Moderate degenerative changes of the mid/lower cervical spine. Spinal canal is patent. Upper chest: Visualized lung apices are clear. Other: Visualized thyroid is unremarkable. IMPRESSION: No evidence of acute osseous abnormality. Moderate degenerative changes of the mid/lower cervical spine. Electronically Signed   By: SJulian HyM.D.   On: 09/20/2022 23:47   DG Knee Complete 4 Views Right  Result Date: 09/20/2022 CLINICAL DATA:  Pain for a year. EXAM: RIGHT KNEE - COMPLETE 4 VIEW COMPARISON:  None Available. FINDINGS: No fracture or dislocation. Preserved joint spaces and bone mineralization. No joint effusion. Scattered vascular calcifications are seen. Chondrocalcinosis seen in both the medial and lateral compartments. IMPRESSION: Chondrocalcinosis. Electronically Signed   By: AJill SideM.D.   On: 09/20/2022 19:52   DG [facility]  1 View  Result Date: 09/20/2022 CLINICAL DATA:  Sepsis.  Shortness of breath  for a year EXAM: PORTABLE CHEST 1 VIEW COMPARISON:  X-ray 09/11/2022. CT angiogram 08/27/2022. Multiple older exams as well. FINDINGS: Underinflated kyphotic x-ray. The apices are obscured by the kyphosis. Normal cardiopericardial silhouette. Tortuous aorta. Mild interstitial prominence. Possibly chronic. No pneumothorax, effusion or edema. No consolidation. Overlapping cardiac leads. Surgical clips in the left upper quadrant. IMPRESSION: Underinflated x-ray.  Chronic changes. Electronically Signed   By: Jill Side M.D.   On: 09/20/2022 19:46    EKG: none today   Labs on Admission: I have personally reviewed the available labs and imaging studies at the time of the admission.  Pertinent labs:    Na++ 131  Mag++ 1.3, 1.5 Lactate 1.1 Procalcitonin 0.31 WBC 10.3 Hgb 9.3 COVID/flu/RSV/respiratory panel all negative ETOH <10 Blood cultures x 2 pending  Family Communication: None present; he declined to have me call his brother at the time of admission  Disposition: Status is: Observation The patient remains OBS appropriate and will d/c before 2 midnights.  Planned Discharge Destination:  Homeless   Time spent: 35 minutes  Author: Karmen Bongo, MD 09/21/2022 5:23 PM  For on call review [email or web address].

## 2022-09-22 ENCOUNTER — Other Ambulatory Visit (HOSPITAL_COMMUNITY): Payer: Self-pay

## 2022-09-22 DIAGNOSIS — R0602 Shortness of breath: Secondary | ICD-10-CM | POA: Diagnosis not present

## 2022-09-22 DIAGNOSIS — R6510 Systemic inflammatory response syndrome (SIRS) of non-infectious origin without acute organ dysfunction: Secondary | ICD-10-CM | POA: Diagnosis not present

## 2022-09-22 LAB — CBC
HCT: 32.8 % — ABNORMAL LOW (ref 39.0–52.0)
Hemoglobin: 9.6 g/dL — ABNORMAL LOW (ref 13.0–17.0)
MCH: 35.2 pg — ABNORMAL HIGH (ref 26.0–34.0)
MCHC: 29.3 g/dL — ABNORMAL LOW (ref 30.0–36.0)
MCV: 120.1 fL — ABNORMAL HIGH (ref 80.0–100.0)
Platelets: 340 10*3/uL (ref 150–400)
RBC: 2.73 MIL/uL — ABNORMAL LOW (ref 4.22–5.81)
RDW: 15.2 % (ref 11.5–15.5)
WBC: 8.5 10*3/uL (ref 4.0–10.5)
nRBC: 0 % (ref 0.0–0.2)

## 2022-09-22 LAB — COMPREHENSIVE METABOLIC PANEL
ALT: 25 U/L (ref 0–44)
AST: 31 U/L (ref 15–41)
Albumin: 2.8 g/dL — ABNORMAL LOW (ref 3.5–5.0)
Alkaline Phosphatase: 67 U/L (ref 38–126)
Anion gap: 11 (ref 5–15)
BUN: 12 mg/dL (ref 8–23)
CO2: 18 mmol/L — ABNORMAL LOW (ref 22–32)
Calcium: 8.5 mg/dL — ABNORMAL LOW (ref 8.9–10.3)
Chloride: 101 mmol/L (ref 98–111)
Creatinine, Ser: 0.56 mg/dL — ABNORMAL LOW (ref 0.61–1.24)
GFR, Estimated: >60 mL/min (ref >60)
Glucose, Bld: 88 mg/dL (ref 70–99)
Potassium: 4.1 mmol/L (ref 3.5–5.1)
Sodium: 130 mmol/L — ABNORMAL LOW (ref 135–145)
Total Bilirubin: 0.5 mg/dL (ref 0.3–1.2)
Total Protein: 7 g/dL (ref 6.5–8.1)

## 2022-09-22 LAB — MAGNESIUM: Magnesium: 1.6 mg/dL — ABNORMAL LOW (ref 1.7–2.4)

## 2022-09-22 MED ORDER — MAGNESIUM OXIDE 400 MG PO CAPS: 1.0000 | ORAL_CAPSULE | Freq: Two times a day (BID) | ORAL | 2 refills | Status: DC

## 2022-09-22 NOTE — Discharge Summary (Addendum)
Physician Discharge Summary   Patient: Anthony [name] MRN: [id number] DOB: [birth year]  Admit date:     09/20/2022  Discharge date: 09/22/22  Discharge Physician: Karmen Bongo   PCP: Pcp, No   Recommendations at discharge:   Follow up with PCP in 1-2 weeks If you are having ongoing generalized joint pains, you may need a referral to rheumatology Check magnesium level at follow up appointment Follow up with the [facility] social worker regarding [place] placement  Discharge Diagnoses: Principal Problem:   SIRS (systemic inflammatory response syndrome) (Portal) Active Problems:   Alcohol abuse   C. difficile colitis   HTN (hypertension)   Paroxysmal atrial fibrillation (HCC)   Hypomagnesemia   Pressure injury of skin   Homelessness  Resolved Problems:   * No resolved hospital problems. [facility] Course: Patient with h/o homelessness, ETOH dependence, afib on Eliquis, chronic diastolic CHF, prior PE, and HTN who presented with SOB, fever.  He was recently hospitalized x 2 - once in early February for aspiration PNA and then again from 2/23-29 for afib with RVR as well as C diff colitis and acute on chronic CHF.  Patient was persistently febrile in the ER with mild tachycardia.  Chest CT with improving infiltrates, but possible ongoing PNA?  Antibiotics were held due to recent h/o C difficile infection.  He remains on PO Vanc through 3/5.  Blood cultures are pending.  Assessment and Plan: * SIRS (systemic inflammatory response syndrome) (HCC) -Pt with persistent fevers in ED, mild tachycardia 100-110 - now resolved -Unclear source of infection - recently hospitalized with aspiration PNA (imaging currently with persistent but improved infiltrates - no current respiratory complaints or findings on exam, on RA and ambulating without difficulty) and with C diff colitis (reports improving diarrhea) -His biggest complaint is generalized arthralgias but this improved and patient now  reports that this is chronic and related to being homeless as well as to remote fall with vertebral compression fractures -Mildly elevated ESR, CRP -Somewhat concerning for rheumatologic disease -UA neg -COVID, FLU, RSV, and 20 virus panel RVP are all negative. -Minimally elevated procalcitonin -Without further fever or development of localizing signs / symptoms and with patient feeling back to baseline without antibiotics, will plan to dc to home today -Blood cultures NTD  C. difficile colitis -Continue PO vanc through 3/5. -Scant diarrhea in ED since presentation.  Alcohol abuse -Cessation encouraged  Hypomagnesemia -Repleted  Paroxysmal atrial fibrillation (HCC) -Continue metoprolol -Continue Xarelto for now, his HAS-BLED score is 2.  Does have h/o PE in past as well.  HTN (hypertension) -Reportedly takes Cardizem and metoprolol; however there is no fill history for diltiazem per Med rec so will hold -Also appears to have been discharged on Midodrine in mid Feb - this was held during current hospitalization -If midodrine is needed, other BP meds should likely be stopped -For now, will continue low-dose metoprolol and hold midodrine  Homelessness -TOC team consulted, reports that patient will need to return to homelessness with [facility] SW assistance to hopefully find ALF housing for the patient  Pressure injury of skin -Stage 1 coccygeal pressure ulcer noted on admission        Pain control - [place] Controlled Substance Reporting System database was reviewed. and patient was instructed, not to drive, operate heavy machinery, perform activities at heights, swimming or participation in water activities or provide baby-sitting services while on Pain, Sleep and Anxiety Medications; until their outpatient Physician has advised to do so  again. Also recommended to not to take more than prescribed Pain, Sleep and Anxiety Medications.  Consultants: TOC team Procedures  performed: None  Disposition:  Patient is homeless, f/u at [place] recommendation:  Regular diet DISCHARGE MEDICATION: Allergies as of 09/22/2022       Reactions   Other Itching, Other (See Comments)   Seasonal allergies- Itchy eyes, runny nose, congestion   Poison Ivy Extract Rash        Medication List     STOP taking these medications    diltiazem 180 MG 24 hr capsule Commonly known as: CARDIZEM CD   methocarbamol 750 MG tablet Commonly known as: ROBAXIN   midodrine 10 MG tablet Commonly known as: PROAMATINE       TAKE these medications    acetaminophen 500 MG tablet Commonly known as: TYLENOL Take 500-1,000 mg by mouth every 6 (six) hours as needed (for pain, headaches, or dental pain).   budesonide-formoterol 160-4.5 MCG/ACT inhaler Commonly known as: SYMBICORT Inhale 2 puffs into the lungs 2 (two) times daily.   cyanocobalamin 1000 MCG tablet Take 1 tablet (1,000 mcg total) by mouth daily.   famotidine 20 MG tablet Commonly known as: PEPCID Take 1 tablet (20 mg total) by mouth daily.   ferrous sulfate 325 (65 FE) MG tablet Take 1 tablet (325 mg total) by mouth daily with breakfast.   folic acid 1 MG tablet Commonly known as: FOLVITE Take 1 tablet (1 mg total) by mouth daily.   loperamide 2 MG capsule Commonly known as: IMODIUM Take 2 capsules (4 mg total) by mouth as needed for diarrhea or loose stools.   Magnesium Oxide 400 MG Caps Take 1 capsule (400 mg total) by mouth 2 (two) times daily.   metoprolol tartrate 25 MG tablet Commonly known as: LOPRESSOR Take 1/2 tablet (12.5 mg total) by mouth 2 (two) times daily.   rosuvastatin 10 MG tablet Commonly known as: CRESTOR Take 1 tablet (10 mg total) by mouth daily.   vancomycin 125 MG capsule Commonly known as: VANCOCIN Take 1 capsule (125 mg total) by mouth 4 (four) times daily for 17 doses.   Xarelto 20 MG Tabs tablet Generic drug: rivaroxaban Take 1 tablet (20 mg) by mouth daily with  supper.        Discharge Exam: Subjective: Patient is feeling better.  He believes that he would be ready for discharge to ALF.  However, since he is being discharged back to the streets he is concerned about having to return to the ER soon.  [facility] SW is working to try to find an ALF for him, and TOC here reports that we will have to continue that process, unfortunately.   Physical Exam:       Vitals:    09/21/22 0705 09/21/22 0811 09/21/22 1948 09/22/22 0438  BP: 100/73   (!) 102/55 100/67  Pulse: 80   90 86  Resp: '20   20 17  '$ Temp: 98.7 F (37.1 C)   98.7 F (37.1 C) 98.6 F (37 C)  TempSrc: Oral        SpO2: 98% 99% 99% 96%    General:  Appears calm and comfortable and is in NAD, disheveled, was standing beside commode at the time of my entrance to the room and ambulated back to the bed with the walker without difficulty Eyes:   EOMI, normal lids, iris ENT:  grossly normal hearing, lips & tongue, mmm Neck:  no LAD, masses or thyromegaly Cardiovascular:  RRR, no  m/r/g.   Respiratory:   Lungs are CTAB.  Normal respiratory effort. Abdomen:  soft, NT, ND Skin:  no rash or induration seen on limited exam Musculoskeletal:  grossly normal tone BUE/BLE, good ROM, no bony abnormality; B pedal edema is improved Psychiatric:  normal mood and affect, speech fluent and appropriate, AOx3 Neurologic:  CN 2-12 grossly intact, moves all extremities in coordinated fashion     EKG: not today     Labs on Admission: I have personally reviewed the available labs and imaging studies at the time of the admission.   Pertinent labs:     Na++ 130 - stable CO2 18 Mag++ 1.6 Albumin 2.8 CRP 14.9 WBC 8.5 Hgb 9.6 - stable ESR 88  Condition at discharge: improving  The results of significant diagnostics from this hospitalization (including imaging, microbiology, ancillary and laboratory) are listed below for reference.   Imaging Studies: CT Angio Chest PE W and/or Wo Contrast  Result Date:  09/20/2022 CLINICAL DATA:  Pulmonary embolism suspected high probability. EXAM: CT ANGIOGRAPHY CHEST WITH CONTRAST TECHNIQUE: Multidetector CT imaging of the chest was performed using the standard protocol during bolus administration of intravenous contrast. Multiplanar CT image reconstructions and MIPs were obtained to evaluate the vascular anatomy. RADIATION DOSE REDUCTION: This exam was performed according to the departmental dose-optimization program which includes automated exposure control, adjustment of the mA and/or kV according to patient size and/or use of iterative reconstruction technique. CONTRAST:  140m OMNIPAQUE IOHEXOL 350 MG/ML SOLN COMPARISON:  08/27/2022. FINDINGS: Cardiovascular: The heart is mildly enlarged and there is no pericardial effusion. Multi-vessel coronary artery calcifications are noted. There is mild atherosclerotic calcification of the aorta without evidence of aneurysm. The pulmonary trunk is normal in caliber. No definite evidence of pulmonary embolism. Evaluation is limited due to respiratory motion artifact. Mediastinum/Nodes: No mediastinal, hilar, or axillary lymphadenopathy. The thyroid gland, trachea, and esophagus are within normal limits. There is a small hiatal hernia. Lungs/Pleura: Scattered airspace disease in the lower lobes bilaterally, possible atelectasis or infiltrate, improved from the prior exam. No effusion or pneumothorax. Upper Abdomen: Gastric surgical changes are noted. No acute abnormality. Musculoskeletal: Old healed rib fractures are noted bilaterally. Degenerative changes are present in the thoracic spine. No acute osseous abnormality. Review of the MIP images confirms the above findings. IMPRESSION: 1. No definite evidence of pulmonary embolism. Examination is limited due to respiratory motion artifact. 2. Scattered airspace opacities in the lower lobes bilaterally, possible atelectasis or infiltrate, improved from the previous exam. 3. Small hiatal  hernia. 4. Cardiomegaly with coronary artery calcifications. 5. Aortic atherosclerosis. Electronically Signed   By: LBrett FairyM.D.   On: 09/20/2022 23:51   CT Cervical Spine Wo Contrast  Result Date: 09/20/2022 CLINICAL DATA:  Neck pain EXAM: CT CERVICAL SPINE WITHOUT CONTRAST TECHNIQUE: Multidetector CT imaging of the cervical spine was performed without intravenous contrast. Multiplanar CT image reconstructions were also generated. RADIATION DOSE REDUCTION: This exam was performed according to the departmental dose-optimization program which includes automated exposure control, adjustment of the mA and/or kV according to patient size and/or use of iterative reconstruction technique. COMPARISON:  Cervical spine radiographs dated 05/18/2021 FINDINGS: Alignment: Normal cervical lordosis. Skull base and vertebrae: No acute fracture. No primary bone lesion or focal pathologic process. Soft tissues and spinal canal: No prevertebral fluid or swelling. No visible canal hematoma. Disc levels: Moderate degenerative changes of the mid/lower cervical spine. Spinal canal is patent. Upper chest: Visualized lung apices are clear. Other: Visualized thyroid is unremarkable. IMPRESSION:  No evidence of acute osseous abnormality. Moderate degenerative changes of the mid/lower cervical spine. Electronically Signed   By: Julian Hy M.D.   On: 09/20/2022 23:47   DG Knee Complete 4 Views Right  Result Date: 09/20/2022 CLINICAL DATA:  Pain for a year. EXAM: RIGHT KNEE - COMPLETE 4 VIEW COMPARISON:  None Available. FINDINGS: No fracture or dislocation. Preserved joint spaces and bone mineralization. No joint effusion. Scattered vascular calcifications are seen. Chondrocalcinosis seen in both the medial and lateral compartments. IMPRESSION: Chondrocalcinosis. Electronically Signed   By: Jill Side M.D.   On: 09/20/2022 19:52   DG Chest Port 1 View  Result Date: 09/20/2022 CLINICAL DATA:  Sepsis.  Shortness of breath  for a year EXAM: PORTABLE CHEST 1 VIEW COMPARISON:  X-ray 09/11/2022. CT angiogram 08/27/2022. Multiple older exams as well. FINDINGS: Underinflated kyphotic x-ray. The apices are obscured by the kyphosis. Normal cardiopericardial silhouette. Tortuous aorta. Mild interstitial prominence. Possibly chronic. No pneumothorax, effusion or edema. No consolidation. Overlapping cardiac leads. Surgical clips in the left upper quadrant. IMPRESSION: Underinflated x-ray.  Chronic changes. Electronically Signed   By: Jill Side M.D.   On: 09/20/2022 19:46   DG Chest Port 1 View  Result Date: 09/11/2022 CLINICAL DATA:  Shortness of breath.  Diarrhea and weakness. EXAM: PORTABLE CHEST 1 VIEW COMPARISON:  Single-view of the chest 08/27/2022. PA and lateral chest 07/15/2022. CT chest 08/27/2022 FINDINGS: There is basilar atelectasis on the left. The lungs are otherwise clear. Heart size is upper normal. Aortic atherosclerosis noted. No pneumothorax or pleural fluid. No acute bony abnormality. IMPRESSION: No acute disease. Electronically Signed   By: Inge Rise M.D.   On: 09/11/2022 11:13   ECHOCARDIOGRAM COMPLETE  Result Date: 08/28/2022    ECHOCARDIOGRAM REPORT   Patient Name:   Anthony [name] Date of Exam: 08/28/2022 Medical Rec #:  [id number]    Height:       70.0 in Accession #:    [id number]   Weight:       134.0 lb Date of Birth:  Dec 14, 1955    BSA:          1.761 m Patient Age:    68 years     BP:           99/72 mmHg Patient Gender: M            HR:           79 bpm. Exam Location:  Inpatient Procedure: 2D Echo, Cardiac Doppler, Color Doppler and Intracardiac            Opacification Agent Indications:    I50.40* Unspecified combined systolic (congestive) and diastolic                 (congestive) heart failure  History:        Patient has prior history of Echocardiogram examinations, most                 recent 03/17/2022. Signs/Symptoms:Altered Mental Status, Dyspnea                 and Shortness of Breath; Risk  Factors:Hypertension and Current                 Smoker. ETOH.  Sonographer:    Roseanna Rainbow RDCS Referring Phys: [phone] ANAND D HONGALGI  Sonographer Comments: Technically difficult study due to poor echo windows, suboptimal parasternal window, suboptimal apical window and suboptimal subcostal window. Image acquisition challenging due to uncooperative patient  and Image acquisition challenging due to patient behavioral factors. Patient in restraints attempting to get up during study. Patient supine and could not turn. IMPRESSIONS  1. Left ventricular ejection fraction, by estimation, is 60 to 65%. The left ventricle has normal function. The left ventricle has no regional wall motion abnormalities. Left ventricular diastolic parameters are indeterminate.  2. Right ventricular systolic function is normal. The right ventricular size is mildly enlarged. Tricuspid regurgitation signal is inadequate for assessing PA pressure.  3. The mitral valve is normal in structure. No evidence of mitral valve regurgitation. No evidence of mitral stenosis.  4. The aortic valve was not well visualized. Aortic valve regurgitation is not visualized. No aortic stenosis is present. FINDINGS  Left Ventricle: Left ventricular ejection fraction, by estimation, is 60 to 65%. The left ventricle has normal function. The left ventricle has no regional wall motion abnormalities. Definity contrast agent was given IV to delineate the left ventricular  endocardial borders. The left ventricular internal cavity size was normal in size. There is no left ventricular hypertrophy. Left ventricular diastolic parameters are indeterminate. Right Ventricle: The right ventricular size is mildly enlarged. Right vetricular wall thickness was not well visualized. Right ventricular systolic function is normal. Tricuspid regurgitation signal is inadequate for assessing PA pressure. Left Atrium: Left atrial size was normal in size. Right Atrium: Right atrial size was  normal in size. Pericardium: There is no evidence of pericardial effusion. Presence of epicardial fat layer. Mitral Valve: The mitral valve is normal in structure. No evidence of mitral valve regurgitation. No evidence of mitral valve stenosis. Tricuspid Valve: The tricuspid valve is normal in structure. Tricuspid valve regurgitation is trivial. Aortic Valve: The aortic valve was not well visualized. Aortic valve regurgitation is not visualized. No aortic stenosis is present. Pulmonic Valve: The pulmonic valve was not well visualized. Pulmonic valve regurgitation is not visualized. Aorta: The aortic root is normal in size and structure. IAS/Shunts: The interatrial septum was not well visualized.  LEFT VENTRICLE PLAX 2D LVIDd:         4.40 cm LVIDs:         3.70 cm LV PW:         0.90 cm LV IVS:        0.70 cm LVOT diam:     2.50 cm LV SV:         46 LV SV Index:   26 LVOT Area:     4.91 cm  LV Volumes (MOD) LV vol s, MOD A4C: 53.0 ml RIGHT VENTRICLE RV S prime:     12.20 cm/s TAPSE (M-mode): 1.3 cm LEFT ATRIUM           Index        RIGHT ATRIUM           Index LA diam:      3.90 cm 2.21 cm/m   RA Area:     14.10 cm LA Vol (A2C): 9.9 ml  5.62 ml/m   RA Volume:   29.80 ml  16.92 ml/m LA Vol (A4C): 46.6 ml 26.46 ml/m  AORTIC VALVE LVOT Vmax:   59.30 cm/s LVOT Vmean:  37.400 cm/s LVOT VTI:    0.094 m  AORTA Ao Root diam: 3.70 cm MITRAL VALVE MV Area (PHT): 3.50 cm    SHUNTS MV Decel Time: 217 msec    Systemic VTI:  0.09 m MV E velocity: 48.30 cm/s  Systemic Diam: 2.50 cm MV A velocity: 41.15 cm/s MV E/A ratio:  1.17  Oswaldo Milian MD Electronically signed by Oswaldo Milian MD Signature Date/Time: 08/28/2022/5:01:33 PM    Final    CT Angio Chest PE W/Cm &/Or Wo Cm  Result Date: 08/27/2022 CLINICAL DATA:  67 year old male with history of positive D-dimer suspected pulmonary embolism. EXAM: CT ANGIOGRAPHY CHEST WITH CONTRAST TECHNIQUE: Multidetector CT imaging of the chest was performed using the  standard protocol during bolus administration of intravenous contrast. Multiplanar CT image reconstructions and MIPs were obtained to evaluate the vascular anatomy. RADIATION DOSE REDUCTION: This exam was performed according to the departmental dose-optimization program which includes automated exposure control, adjustment of the mA and/or kV according to patient size and/or use of iterative reconstruction technique. CONTRAST:  26m OMNIPAQUE IOHEXOL 350 MG/ML SOLN COMPARISON:  Chest CTA 03/23/2022. FINDINGS: Cardiovascular: There are no filling defects within the pulmonary arterial tree to suggest pulmonary embolism. Heart size is mildly enlarged. There is no significant pericardial fluid, thickening or pericardial calcification. There is aortic atherosclerosis, as well as atherosclerosis of the great vessels of the mediastinum and the coronary arteries, including calcified atherosclerotic plaque in the left main, left anterior descending and left circumflex coronary arteries. Mediastinum/Nodes: No pathologically enlarged mediastinal or hilar lymph nodes. Esophagus is unremarkable in appearance. No axillary lymphadenopathy. Lungs/Pleura: Patchy areas of ground-glass attenuation and airspace consolidation are noted in the dependent portions of the lower lobes bilaterally. In this same distribution there are extensive areas of mucoid impaction, most evident in the left lower lobe. No pleural effusions. No definite suspicious appearing pulmonary nodules or masses are noted. Upper Abdomen: Aortic atherosclerosis. Low attenuation throughout the visualized hepatic parenchyma, indicative of a background of hepatic steatosis. Musculoskeletal: Several old healed bilateral posterior rib fractures are incidentally noted. There are no aggressive appearing lytic or blastic lesions noted in the visualized portions of the skeleton. Review of the MIP images confirms the above findings. IMPRESSION: 1. No evidence of pulmonary  embolism. 2. Dependent airspace disease in the lower lobes of the lungs bilaterally, the appearance of which is most suggestive of aspiration pneumonitis and developing aspiration pneumonia. Further clinical evaluation is recommended. 3. Aortic atherosclerosis, in addition to left main and 2 vessel coronary artery disease. Please note that although the presence of coronary artery calcium documents the presence of coronary artery disease, the severity of this disease and any potential stenosis cannot be assessed on this non-gated CT examination. Assessment for potential risk factor modification, dietary therapy or pharmacologic therapy may be warranted, if clinically indicated. 4. Mild cardiomegaly. Aortic Atherosclerosis (ICD10-I70.0). Electronically Signed   By: DVinnie LangtonM.D.   On: 08/27/2022 05:10   DG Chest Port 1 View  Result Date: 08/27/2022 CLINICAL DATA:  Shortness of breath EXAM: PORTABLE CHEST 1 VIEW COMPARISON:  07/15/2022 FINDINGS: Mild chronic elevation of the right hemidiaphragm. Heart and mediastinal contours are within normal limits. No focal opacities or effusions. No acute bony abnormality. IMPRESSION: No active disease. Electronically Signed   By: KRolm BaptiseM.D.   On: 08/27/2022 01:10    Microbiology: Results for orders placed or performed during the hospital encounter of 09/20/22  Blood Culture (routine x 2)     Status: None (Preliminary result)   Collection Time: 09/20/22  7:53 PM   Specimen: BLOOD  Result Value Ref Range Status   Specimen Description   Final    BLOOD RIGHT ANTECUBITAL Performed at [facility], [place] [address]   Special Requests   Final    BOTTLES DRAWN AEROBIC  AND ANAEROBIC Blood Culture results may not be optimal due to an inadequate volume of blood received in culture bottles Performed at [facility], [place] [address], [place], [place] [address]    Culture   Final    NO GROWTH 2  DAYS Performed at [facility] [address], [place], [place] [address]    Report Status PENDING  Incomplete  Blood Culture (routine x 2)     Status: None (Preliminary result)   Collection Time: 09/20/22  7:53 PM   Specimen: BLOOD  Result Value Ref Range Status   Specimen Description   Final    BLOOD LEFT ANTECUBITAL Performed at [facility] [address], [place], [place] [address]    Special Requests   Final    BOTTLES DRAWN AEROBIC AND ANAEROBIC Blood Culture adequate volume Performed at [facility] [address], [place], [place] [address]    Culture   Final    NO GROWTH 2 DAYS Performed at [facility] [address] [place], [place] [address]    Report Status PENDING  Incomplete  Resp panel by RT-PCR (RSV, Flu A&B, Covid) Anterior Nasal Swab     Status: None   Collection Time: 09/20/22  8:08 PM   Specimen: Anterior Nasal Swab  Result Value Ref Range Status   SARS Coronavirus 2 by RT PCR NEGATIVE NEGATIVE Final    Comment: (NOTE) SARS-CoV-2 target nucleic acids are NOT DETECTED.  The SARS-CoV-2 RNA is generally detectable in upper respiratory specimens during the acute phase of infection. The lowest concentration of SARS-CoV-2 viral copies this assay can detect is 138 copies/mL. A negative result does not preclude SARS-Cov-2 infection and should not be used as the sole basis for treatment or other patient management decisions. A negative result may occur with  improper specimen collection/handling, submission of specimen other than nasopharyngeal swab, presence of viral mutation(s) within the areas targeted by this assay, and inadequate number of viral copies(<138 copies/mL). A negative result must be combined with clinical observations, patient history, and epidemiological information. The expected result is Negative.  Fact Sheet for Patients:  EntrepreneurPulse.com.au  Fact Sheet for  Healthcare Providers:  IncredibleEmployment.be  This test is no t yet approved or cleared by the [place] FDA and  has been authorized for detection and/or diagnosis of SARS-CoV-2 by FDA under an Emergency Use Authorization (EUA). This EUA will remain  in effect (meaning this test can be used) for the duration of the COVID-19 declaration under Section 564(b)(1) of the Act, 21 U.S.C.section 360bbb-3(b)(1), unless the authorization is terminated  or revoked sooner.       Influenza A by PCR NEGATIVE NEGATIVE Final   Influenza B by PCR NEGATIVE NEGATIVE Final    Comment: (NOTE) The Xpert Xpress SARS-CoV-2/FLU/RSV plus assay is intended as an aid in the diagnosis of influenza from Nasopharyngeal swab specimens and should not be used as a sole basis for treatment. Nasal washings and aspirates are unacceptable for Xpert Xpress SARS-CoV-2/FLU/RSV testing.  Fact Sheet for Patients: EntrepreneurPulse.[employer].au  Fact Sheet for Healthcare Providers: IncredibleEmployment.be  This test is not yet approved or cleared by the [place] FDA and has been authorized for detection and/or diagnosis of SARS-CoV-2 by FDA under an Emergency Use Authorization (EUA). This EUA will remain in effect (meaning this test can be used) for the duration of the COVID-19 declaration under Section 564(b)(1) of the Act, 21 U.S.C. section 360bbb-3(b)(1), unless the authorization is  terminated or revoked.     Resp Syncytial Virus by PCR NEGATIVE NEGATIVE Final    Comment: (NOTE) Fact Sheet for Patients: EntrepreneurPulse.com.au  Fact Sheet for Healthcare Providers: IncredibleEmployment.be  This test is not yet approved or cleared by the [place] FDA and has been authorized for detection and/or diagnosis of SARS-CoV-2 by FDA under an Emergency Use Authorization (EUA). This EUA will remain in effect (meaning this  test can be used) for the duration of the COVID-19 declaration under Section 564(b)(1) of the Act, 21 U.S.C. section 360bbb-3(b)(1), unless the authorization is terminated or revoked.  Performed at [facility], [place] [address], [place], [place] [address]   Respiratory (~20 pathogens) panel by PCR     Status: None   Collection Time: 09/20/22  9:25 PM   Specimen: Nasopharyngeal Swab; Respiratory  Result Value Ref Range Status   Adenovirus NOT DETECTED NOT DETECTED Final   Coronavirus 229E NOT DETECTED NOT DETECTED Final    Comment: (NOTE) The Coronavirus on the Respiratory Panel, DOES NOT test for the novel  Coronavirus (2019 nCoV)    Coronavirus HKU1 NOT DETECTED NOT DETECTED Final   Coronavirus NL63 NOT DETECTED NOT DETECTED Final   Coronavirus OC43 NOT DETECTED NOT DETECTED Final   Metapneumovirus NOT DETECTED NOT DETECTED Final   Rhinovirus / Enterovirus NOT DETECTED NOT DETECTED Final   Influenza A NOT DETECTED NOT DETECTED Final   Influenza B NOT DETECTED NOT DETECTED Final   Parainfluenza Virus 1 NOT DETECTED NOT DETECTED Final   Parainfluenza Virus 2 NOT DETECTED NOT DETECTED Final   Parainfluenza Virus 3 NOT DETECTED NOT DETECTED Final   Parainfluenza Virus 4 NOT DETECTED NOT DETECTED Final   Respiratory Syncytial Virus NOT DETECTED NOT DETECTED Final   Bordetella pertussis NOT DETECTED NOT DETECTED Final   Bordetella Parapertussis NOT DETECTED NOT DETECTED Final   Chlamydophila pneumoniae NOT DETECTED NOT DETECTED Final   Mycoplasma pneumoniae NOT DETECTED NOT DETECTED Final    Comment: Performed at [facility], [place]. [address], [place], [place] [address]     Discharge time spent: greater than 30 minutes.  Signed: Karmen Bongo, MD [facility] 09/22/2022

## 2022-09-22 NOTE — Assessment & Plan Note (Signed)
-  Stage 1 coccygeal pressure ulcer noted on admission

## 2022-09-22 NOTE — Transitions of Care (Post Inpatient/ED Visit) (Unsigned)
   09/22/2022  Name: Anthony [name] MRN: [id number] DOB: 1955/09/05  Today's TOC FU Call Status: Today's TOC FU Call Status:: Unsuccessful Call (2nd Attempt) Unsuccessful Call (1st Attempt) Date: 09/18/22 Unsuccessful Call (2nd Attempt) Date: 09/22/22  Attempted to reach the patient regarding the most recent Inpatient/ED visit.  Follow Up Plan: Additional outreach attempts will be made to reach the patient to complete the Transitions of Care (Post Inpatient/ED visit) call.   Signature Juanda Crumble, [place] Direct Dial [phone]

## 2022-09-23 NOTE — Transitions of Care (Post Inpatient/ED Visit) (Signed)
   09/23/2022  Name: Anthony [name] MRN: [id number] DOB: [birth year]  Today's TOC FU Call Status: Today's TOC FU Call Status:: Unsuccessful Call (2nd Attempt) Unsuccessful Call (1st Attempt) Date: 09/18/22 Unsuccessful Call (2nd Attempt) Date: 09/22/22 Unsuccessful Call (3rd Attempt) Date: 09/23/22  Attempted to reach the patient regarding the most recent Inpatient/ED visit.  Follow Up Plan: No further outreach attempts will be made at this time. We have been unable to contact the patient.  Signature Juanda Crumble, [place] Direct Dial [phone]

## 2022-09-24 ENCOUNTER — Encounter (HOSPITAL_COMMUNITY): Payer: Self-pay | Admitting: Emergency Medicine

## 2022-09-24 ENCOUNTER — Emergency Department (HOSPITAL_COMMUNITY)
Admission: EM | Admit: 2022-09-24 | Discharge: 2022-09-25 | Disposition: A | Discharge status: Home / Self Care | Payer: 59 | Attending: Emergency Medicine | Admitting: Emergency Medicine

## 2022-09-24 ENCOUNTER — Emergency Department (HOSPITAL_COMMUNITY): Payer: 59

## 2022-09-24 ENCOUNTER — Other Ambulatory Visit: Payer: Self-pay

## 2022-09-24 DIAGNOSIS — Z7901 Long term (current) use of anticoagulants: Secondary | ICD-10-CM | POA: Insufficient documentation

## 2022-09-24 DIAGNOSIS — Y92521 Bus station as the place of occurrence of the external cause: Secondary | ICD-10-CM | POA: Diagnosis not present

## 2022-09-24 DIAGNOSIS — W08XXXA Fall from other furniture, initial encounter: Secondary | ICD-10-CM | POA: Diagnosis not present

## 2022-09-24 DIAGNOSIS — M549 Dorsalgia, unspecified: Secondary | ICD-10-CM | POA: Diagnosis not present

## 2022-09-24 DIAGNOSIS — Z5900 Homelessness unspecified: Secondary | ICD-10-CM | POA: Diagnosis not present

## 2022-09-24 DIAGNOSIS — W19XXXA Unspecified fall, initial encounter: Secondary | ICD-10-CM

## 2022-09-24 MED ORDER — ACETAMINOPHEN 500 MG PO TABS
1000.0000 mg | ORAL_TABLET | Freq: Once | ORAL | Status: AC
  Administered 2022-09-25: 1000 mg via ORAL
  Filled 2022-09-24: qty 2

## 2022-09-24 NOTE — ED Notes (Signed)
Patient admits to alcohol intake.
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
## 2022-09-24 NOTE — ED Provider Notes (Signed)
[place] EMERGENCY DEPARTMENT AT [facility] Provider Note   CSN: [id number] Arrival date & time: 09/24/22  2236     History  Chief Complaint  Patient presents with   Fall    Anthony [name] is a 67 y.o. male.  67 yo M with a chief complaints of difficulty getting up after he fell off of a bench.  He has been sleeping there.  He is homeless and that is where he had chosen to stay.  Has been drinking heavily throughout the day.  He rolled off the bench inadvertently and then was unable to get back up.  He has trouble telling me why he could not get back up.  He does not think that he suffered any injury from the fall.  Has chronic back pain and does not think that it is any worse than normal.  Level 5 caveat intoxication   Fall       Home Medications Prior to Admission medications   Medication Sig Start Date End Date Taking? Authorizing Provider  acetaminophen (TYLENOL) 500 MG tablet Take 500-1,000 mg by mouth every 6 (six) hours as needed (for pain, headaches, or dental pain).    [provider]  budesonide-formoterol (SYMBICORT) 160-4.5 MCG/ACT inhaler Inhale 2 puffs into the lungs 2 (two) times daily.    [provider]  cyanocobalamin 1000 MCG tablet Take 1 tablet (1,000 mcg total) by mouth daily. 09/18/22 10/18/22  Alma Friendly, MD  famotidine (PEPCID) 20 MG tablet Take 1 tablet (20 mg total) by mouth daily. 09/18/22 10/18/22  Alma Friendly, MD  ferrous sulfate 325 (65 FE) MG tablet Take 1 tablet (325 mg total) by mouth daily with breakfast. 09/18/22 10/18/22  Alma Friendly, MD  folic acid (FOLVITE) 1 MG tablet Take 1 tablet (1 mg total) by mouth daily. 09/06/22   Amin, Jeanella Flattery, MD  loperamide (IMODIUM) 2 MG capsule Take 2 capsules (4 mg total) by mouth as needed for diarrhea or loose stools. 09/05/22   Amin, Jeanella Flattery, MD  Magnesium Oxide 400 MG CAPS Take 1 capsule (400 mg total) by mouth 2 (two) times daily. 09/22/22   Karmen Bongo, MD  metoprolol tartrate (LOPRESSOR) 25 MG tablet Take 1/2 tablet (12.5 mg total) by mouth 2 (two) times daily. 09/17/22 10/17/22  Alma Friendly, MD  rivaroxaban (XARELTO) 20 MG TABS tablet Take 1 tablet (20 mg) by mouth daily with supper. 09/17/22 10/17/22  Alma Friendly, MD  rosuvastatin (CRESTOR) 10 MG tablet Take 1 tablet (10 mg total) by mouth daily. 09/05/22   Damita Lack, MD      Allergies    Other and Poison ivy extract    Review of Systems   Review of Systems  Physical Exam Updated Vital Signs BP (!) 128/97   Pulse 91   Temp 98.5 F (36.9 C)   Resp 18   SpO2 100%  Physical Exam Vitals and nursing note reviewed.  Constitutional:      Appearance: He is well-developed.  HENT:     Head: Normocephalic and atraumatic.  Eyes:     Pupils: Pupils are equal, round, and reactive to light.  Neck:     Vascular: No JVD.  Cardiovascular:     Rate and Rhythm: Normal rate and regular rhythm.     Heart sounds: No murmur heard.    No friction rub. No gallop.  Pulmonary:     Effort: No respiratory distress.  Breath sounds: No wheezing.  Abdominal:     General: There is no distension.     Tenderness: There is no abdominal tenderness. There is no guarding or rebound.  Musculoskeletal:        General: Normal range of motion.     Cervical back: Normal range of motion and neck supple.  Skin:    Coloration: Skin is not pale.     Findings: No rash.  Neurological:     Mental Status: He is alert and oriented to person, place, and time.  Psychiatric:        Behavior: Behavior normal.     ED Results / Procedures / Treatments   Labs (all labs ordered are listed, but only abnormal results are displayed) Labs Reviewed - No data to display  EKG None  Radiology DG Lumbar Spine Complete  Result Date: 09/24/2022 CLINICAL DATA:  Low back pain after fall. EXAM: LUMBAR SPINE - COMPLETE 4+ VIEW COMPARISON:  05/18/2021 FINDINGS: There are 5 non-rib-bearing  lumbar vertebra, with lumbarization of S1. The lower most fully formed disc space will be labeled L5. Chronic L2, L3, and L4 compression deformities are unchanged from prior exam. There is no evidence of acute fracture. Mild multilevel degenerative disc disease and facet hypertrophy. The bones are subjectively under mineralized. No sacroiliac joint diastasis. Postsurgical change in the left upper quadrant. IMPRESSION: 1. No acute fracture or subluxation of the lumbar spine. 2. Chronic L2, L3, and L4 compression deformities. 3. Osteopenia/osteoporosis. Electronically Signed   By: Keith Rake M.D.   On: 09/24/2022 23:51    Procedures Procedures    Medications Ordered in ED Medications  acetaminophen (TYLENOL) tablet 1,000 mg (1,000 mg Oral Given 09/25/22 0005)    ED Course/ Medical Decision Making/ A&P                             Medical Decision Making Amount and/or Complexity of Data Reviewed Radiology: ordered.  Risk OTC drugs.   67 yo M with a chief complaints of fall off of a bench.  He was trying to get back up and was having trouble and EMS saw him and picked him up and took him here.  I am not sure if he was having trouble getting back up on the bench because he was so intoxicated or due to some injury.  He has no obvious pain on exam.  He tells me he has chronic back pain.  Will obtain a plain film.  Plain film independently interpreted by me without acute fracture.  Ambulated here without obvious area of discomfort.  Discharge home..  12:54 AM:  I have discussed the diagnosis/risks/treatment options with the patient.  Evaluation and diagnostic testing in the emergency department does not suggest an emergent condition requiring admission or immediate intervention beyond what has been performed at this time.  They will follow up with PCP. We also discussed returning to the ED immediately if new or worsening sx occur. We discussed the sx which are most concerning (e.g., sudden  worsening pain, fever, inability to tolerate by mouth) that necessitate immediate return. Medications administered to the patient during their visit and any new prescriptions provided to the patient are listed below.  Medications given during this visit Medications  acetaminophen (TYLENOL) tablet 1,000 mg (1,000 mg Oral Given 09/25/22 0005)     The patient appears reasonably screen and/or stabilized for discharge and I doubt any other medical condition or other  EMC requiring further screening, evaluation, or treatment in the ED at this time prior to discharge.         Final Clinical Impression(s) / ED Diagnoses Final diagnoses:  Fall, initial encounter    Rx / DC Orders ED Discharge Orders     None         Deno Etienne, DO 09/25/22 [phone]

## 2022-09-25 LAB — CULTURE, BLOOD (ROUTINE X 2)
Culture: NO GROWTH
Culture: NO GROWTH
Special Requests: ADEQUATE

## 2022-09-25 NOTE — ED Notes (Signed)
Pt began ambulating from department in the company of security officers.

## 2022-09-25 NOTE — ED Notes (Signed)
Pt ambulated in room using walked without assistance

## 2022-09-25 NOTE — ED Notes (Signed)
Pt refusing to leave security at bedside

## 2022-09-25 NOTE — ED Notes (Signed)
Attempted to discharge patient. Pt unwilling to leave stating he "wants a second opinion"  Explained that is his right and he may pursue that option.  Asked for "another doctor" explained we have one attending provider here in the ED at this time.  Instructed by off duty [employer] and security to please depart as he has been discharged.  Pt refuses to leave department.

## 2022-09-25 NOTE — Discharge Instructions (Signed)
Follow up with your doctor

## 2022-09-28 NOTE — Progress Notes (Deleted)
  Cardiology Office Note:    Date:  09/28/2022  ID:  Anthony [name], DOB 13-Apr-1956, MRN [id number] [facility] Prov[facility] Cardiologist:  Lauree Chandler, MD { Click to update primary MD,subspecialty MD or APP then REFRESH:1}     Patient Profile:   (HFpEF) heart failure with preserved ejection fraction TTE 08/28/2022: EF 60-65, no RWMA, normal RVSF  Paroxysmal atrial fibrillation  Coronary artery disease  CCTA 10/02/2020: CAC score 884 (91st percentile), LAD mid moderate stenoses (50-70), distal moderate stenoses (50-70), minimal nonobstructive calcific plaque in diagonal (1-24), LCx proximal 1-24; aortic atherosclerosis Hx of unprovoked pulmonary embolism  Hypertension  Hyperlipidemia  Alcohol abuse with prior hx of DTs Unhoused  Non-adherence        History of Present Illness:   Anthony [name] is a 67 y.o. male who returns for post hospital f/u. He has never been seen in our office. He has only been seen by service when admitted to the hospital. He was last evaluated by Cardiology for atrial fibrillation with RVR during an admission for CDiff colitis 2/23-2/29. He converted to NSR on Diltiazem and was diuresed for volume excess. Prior to that admission, he had been admitted for aspiration pneumonia. He was admitted again 3/3-3/5 with persistent fevers, tachycardia c/w SIRS. His last visit to the ED was 3/7 after a fall. ***  ROS ***    Studies Reviewed:    EKG:  ***  ***  Risk Assessment/Calculations:   {Does this patient have ATRIAL FIBRILLATION?:[phone]} No BP recorded.  {Refresh Note OR Click here to enter BP  :1}***       Physical Exam:   VS:  There were no vitals taken for this visit.   Wt Readings from Last 3 Encounters:  09/17/22 137 lb 2 oz (62.2 kg)  09/05/22 141 lb 8.6 oz (64.2 kg)  08/21/22 139 lb 15.9 oz (63.5 kg)    Physical Exam***    ASSESSMENT AND PLAN:   No problem-specific Assessment & Plan notes found for this encounter. ***    {Are you  ordering a CV Procedure (e.g. stress test, cath, DCCV, TEE, etc)?   Press F2        :[id number]}  Dispo:  No follow-ups on file. Signed, Richardson Dopp, PA-C

## 2022-09-29 ENCOUNTER — Ambulatory Visit: Payer: 59 | Admitting: Physician Assistant

## 2022-09-29 DIAGNOSIS — I480 Paroxysmal atrial fibrillation: Secondary | ICD-10-CM

## 2022-10-01 ENCOUNTER — Encounter (HOSPITAL_COMMUNITY): Payer: Self-pay

## 2022-10-01 ENCOUNTER — Other Ambulatory Visit: Payer: Self-pay

## 2022-10-01 ENCOUNTER — Emergency Department (HOSPITAL_COMMUNITY)
Admission: EM | Admit: 2022-10-01 | Discharge: 2022-10-01 | Disposition: A | Discharge status: Home / Self Care | Payer: 59 | Attending: Emergency Medicine | Admitting: Emergency Medicine

## 2022-10-01 DIAGNOSIS — I10 Essential (primary) hypertension: Secondary | ICD-10-CM | POA: Diagnosis not present

## 2022-10-01 DIAGNOSIS — Z79899 Other long term (current) drug therapy: Secondary | ICD-10-CM | POA: Insufficient documentation

## 2022-10-01 DIAGNOSIS — Z5900 Homelessness unspecified: Secondary | ICD-10-CM | POA: Insufficient documentation

## 2022-10-01 DIAGNOSIS — M545 Low back pain, unspecified: Secondary | ICD-10-CM | POA: Diagnosis not present

## 2022-10-01 DIAGNOSIS — R600 Localized edema: Secondary | ICD-10-CM | POA: Diagnosis not present

## 2022-10-01 DIAGNOSIS — Z7901 Long term (current) use of anticoagulants: Secondary | ICD-10-CM | POA: Diagnosis not present

## 2022-10-01 DIAGNOSIS — R609 Edema, unspecified: Secondary | ICD-10-CM

## 2022-10-01 DIAGNOSIS — G8929 Other chronic pain: Secondary | ICD-10-CM | POA: Insufficient documentation

## 2022-10-01 DIAGNOSIS — R197 Diarrhea, unspecified: Secondary | ICD-10-CM | POA: Diagnosis present

## 2022-10-01 LAB — COMPREHENSIVE METABOLIC PANEL
ALT: 19 U/L (ref 0–44)
AST: 23 U/L (ref 15–41)
Albumin: 3.1 g/dL — ABNORMAL LOW (ref 3.5–5.0)
Alkaline Phosphatase: 62 U/L (ref 38–126)
Anion gap: 8 (ref 5–15)
BUN: 13 mg/dL (ref 8–23)
CO2: 18 mmol/L — ABNORMAL LOW (ref 22–32)
Calcium: 8.4 mg/dL — ABNORMAL LOW (ref 8.9–10.3)
Chloride: 115 mmol/L — ABNORMAL HIGH (ref 98–111)
Creatinine, Ser: 0.54 mg/dL — ABNORMAL LOW (ref 0.61–1.24)
GFR, Estimated: >60 mL/min (ref >60)
Glucose, Bld: 85 mg/dL (ref 70–99)
Potassium: 3.6 mmol/L (ref 3.5–5.1)
Sodium: 141 mmol/L (ref 135–145)
Total Bilirubin: 0.5 mg/dL (ref 0.3–1.2)
Total Protein: 6.9 g/dL (ref 6.5–8.1)

## 2022-10-01 LAB — URINALYSIS, ROUTINE W REFLEX MICROSCOPIC
Bilirubin Urine: NEGATIVE
Glucose, UA: NEGATIVE mg/dL
Hgb urine dipstick: NEGATIVE
Ketones, ur: NEGATIVE mg/dL
Leukocytes,Ua: NEGATIVE
Nitrite: NEGATIVE
Protein, ur: NEGATIVE mg/dL
Specific Gravity, Urine: 1.008 (ref 1.005–1.030)
pH: 5 (ref 5.0–8.0)

## 2022-10-01 LAB — CBC WITH DIFFERENTIAL/PLATELET
Abs Immature Granulocytes: 0.01 10*3/uL (ref 0.00–0.07)
Basophils Absolute: 0.1 10*3/uL (ref 0.0–0.1)
Basophils Relative: 1 %
Eosinophils Absolute: 0.2 10*3/uL (ref 0.0–0.5)
Eosinophils Relative: 4 %
HCT: 29.2 % — ABNORMAL LOW (ref 39.0–52.0)
Hemoglobin: 9.2 g/dL — ABNORMAL LOW (ref 13.0–17.0)
Immature Granulocytes: 0 %
Lymphocytes Relative: 43 %
Lymphs Abs: 2.8 10*3/uL (ref 0.7–4.0)
MCH: 33.9 pg (ref 26.0–34.0)
MCHC: 31.5 g/dL (ref 30.0–36.0)
MCV: 107.7 fL — ABNORMAL HIGH (ref 80.0–100.0)
Monocytes Absolute: 0.6 10*3/uL (ref 0.1–1.0)
Monocytes Relative: 9 %
Neutro Abs: 2.8 10*3/uL (ref 1.7–7.7)
Neutrophils Relative %: 43 %
Platelets: 346 10*3/uL (ref 150–400)
RBC: 2.71 MIL/uL — ABNORMAL LOW (ref 4.22–5.81)
RDW: 15.5 % (ref 11.5–15.5)
WBC: 6.4 10*3/uL (ref 4.0–10.5)
nRBC: 0 % (ref 0.0–0.2)

## 2022-10-01 LAB — LIPASE, BLOOD: Lipase: 43 U/L (ref 11–51)

## 2022-10-01 MED ORDER — LOPERAMIDE HCL 2 MG PO CAPS: 4.0000 mg | ORAL_CAPSULE | ORAL | 0 refills | Status: DC | PRN

## 2022-10-01 MED ORDER — FUROSEMIDE 40 MG PO TABS
20.0000 mg | ORAL_TABLET | Freq: Once | ORAL | Status: AC
  Administered 2022-10-01: 20 mg via ORAL
  Filled 2022-10-01: qty 1

## 2022-10-01 MED ORDER — ACETAMINOPHEN 500 MG PO TABS
1000.0000 mg | ORAL_TABLET | Freq: Once | ORAL | Status: AC
  Administered 2022-10-01: 1000 mg via ORAL
  Filled 2022-10-01: qty 2

## 2022-10-01 MED ORDER — FUROSEMIDE 20 MG PO TABS: 20.0000 mg | ORAL_TABLET | Freq: Every day | ORAL | 0 refills | Status: DC

## 2022-10-01 MED ORDER — ACETAMINOPHEN 500 MG PO TABS: 500.0000 mg | ORAL_TABLET | Freq: Four times a day (QID) | ORAL | 0 refills | Status: DC | PRN

## 2022-10-01 NOTE — ED Provider Notes (Signed)
EMERGENCY DEPARTMENT AT [facility] Provider Note   CSN: [id number] Arrival date & time: 10/01/22  0242     History  Chief Complaint  Patient presents with   Diarrhea    Anthony [name] is a 67 y.o. male.  The history is provided by the patient and medical records. No language interpreter was used.  Diarrhea    67 year old male significant history of homelessness, alcohol abuse, prior PE, hypertension, atrial fibrillation on Xarelto brought here via EMS from [facility] with multiple complaints.  Patient states he fell and broke his back approximately 20 years ago.  Since then he has had chronic back pain.  He recently became homeless and states he has been walking more than usual to try to find a place and within the past few days his back is causing him to have more pain.  Pain is sharp and achy throbbing similar to prior back pain.  Furthermore, patient states he has had recurrent diarrhea ongoing for more than a month.  Sometimes he would have up to 2-3 bouts of loose stools.  No recent antibiotic use.  Lastly patient complaining of bilateral leg swelling which is an ongoing issue for several months as well.  States I need some fluid pill.  Does not endorse any fever no bowel bladder incontinence or saddle anesthesia.  No history of IV drug use or active cancer.  Home Medications Prior to Admission medications   Medication Sig Start Date End Date Taking? Authorizing Provider  acetaminophen (TYLENOL) 500 MG tablet Take 500-1,000 mg by mouth every 6 (six) hours as needed (for pain, headaches, or dental pain).    [provider]  budesonide-formoterol (SYMBICORT) 160-4.5 MCG/ACT inhaler Inhale 2 puffs into the lungs 2 (two) times daily.    [provider]  cyanocobalamin 1000 MCG tablet Take 1 tablet (1,000 mcg total) by mouth daily. 09/18/22 10/18/22  Alma Friendly, MD  famotidine (PEPCID) 20 MG tablet Take 1 tablet (20 mg total) by mouth daily. 09/18/22  10/18/22  Alma Friendly, MD  ferrous sulfate 325 (65 FE) MG tablet Take 1 tablet (325 mg total) by mouth daily with breakfast. 09/18/22 10/18/22  Alma Friendly, MD  folic acid (FOLVITE) 1 MG tablet Take 1 tablet (1 mg total) by mouth daily. 09/06/22   Amin, Jeanella Flattery, MD  loperamide (IMODIUM) 2 MG capsule Take 2 capsules (4 mg total) by mouth as needed for diarrhea or loose stools. 09/05/22   Amin, Jeanella Flattery, MD  Magnesium Oxide 400 MG CAPS Take 1 capsule (400 mg total) by mouth 2 (two) times daily. 09/22/22   Karmen Bongo, MD  metoprolol tartrate (LOPRESSOR) 25 MG tablet Take 1/2 tablet (12.5 mg total) by mouth 2 (two) times daily. 09/17/22 10/17/22  Alma Friendly, MD  rivaroxaban (XARELTO) 20 MG TABS tablet Take 1 tablet (20 mg) by mouth daily with supper. 09/17/22 10/17/22  Alma Friendly, MD  rosuvastatin (CRESTOR) 10 MG tablet Take 1 tablet (10 mg total) by mouth daily. 09/05/22   Damita Lack, MD      Allergies    Other and Poison ivy extract    Review of Systems   Review of Systems  Gastrointestinal:  Positive for diarrhea.  All other systems reviewed and are negative.   Physical Exam Updated Vital Signs BP 105/69 (BP Location: Right Arm)   Pulse 78   Temp 98.2 F (36.8 C)   Resp 18   SpO2 95%  Physical  Exam Vitals and nursing note reviewed.  Constitutional:      General: He is not in acute distress.    Appearance: He is well-developed.  HENT:     Head: Atraumatic.  Eyes:     Conjunctiva/sclera: Conjunctivae normal.  Cardiovascular:     Rate and Rhythm: Normal rate and regular rhythm.     Pulses: Normal pulses.     Heart sounds: Normal heart sounds.  Pulmonary:     Breath sounds: No wheezing, rhonchi or rales.  Abdominal:     Palpations: Abdomen is soft.     Tenderness: There is no abdominal tenderness.  Musculoskeletal:        General: No tenderness (No significant midline spine tenderness crepitus or step-off.  Mild tenderness to  lumbar paraspinal muscle bilaterally.).     Cervical back: Neck supple.     Right lower leg: Edema present.     Left lower leg: Edema present.     Comments: 1+ pitting edema to bilateral lower extremity extending towards the knees with intact distal pedal pulses.  Skin:    Capillary Refill: Capillary refill takes less than 2 seconds.     Findings: No rash.  Neurological:     Mental Status: He is alert. Mental status is at baseline.     ED Results / Procedures / Treatments   Labs (all labs ordered are listed, but only abnormal results are displayed) Labs Reviewed  COMPREHENSIVE METABOLIC PANEL - Abnormal; Notable for the following components:      Result Value   Chloride 115 (*)    CO2 18 (*)    Creatinine, Ser 0.54 (*)    Calcium 8.4 (*)    Albumin 3.1 (*)    All other components within normal limits  URINALYSIS, ROUTINE W REFLEX MICROSCOPIC - Abnormal; Notable for the following components:   Color, Urine STRAW (*)    All other components within normal limits  CBC WITH DIFFERENTIAL/PLATELET - Abnormal; Notable for the following components:   RBC 2.71 (*)    Hemoglobin 9.2 (*)    HCT 29.2 (*)    MCV 107.7 (*)    All other components within normal limits  LIPASE, BLOOD    EKG None  Radiology No results found.  Procedures Procedures    Medications Ordered in ED Medications  acetaminophen (TYLENOL) tablet 1,000 mg (1,000 mg Oral Given 10/01/22 0844)  furosemide (LASIX) tablet 20 mg (20 mg Oral Given 10/01/22 0845)    ED Course/ Medical Decision Making/ A&P                             Medical Decision Making Amount and/or Complexity of Data Reviewed Labs: ordered.  Risk OTC drugs. Prescription drug management.   BP 105/69 (BP Location: Right Arm)   Pulse 78   Temp 98.2 F (36.8 C)   Resp 18   SpO2 95%   4:57 AM 67 year old male significant history of homelessness, alcohol abuse, prior PE, hypertension, atrial fibrillation on Xarelto brought here via  EMS from [facility] with multiple complaints.  Patient states he fell and broke his back approximately 20 years ago.  Since then he has had chronic back pain.  He recently became homeless and states he has been walking more than usual to try to find a place and within the past few days his back is causing him to have more pain.  Pain is sharp and achy throbbing similar to prior  back pain.  Furthermore, patient states he has had recurrent diarrhea ongoing for more than a month.  Sometimes he would have up to 2-3 bouts of loose stools.  No recent antibiotic use.  Lastly patient complaining of bilateral leg swelling which is an ongoing issue for several months as well.  States I need some fluid pill.  Does not endorse any fever no bowel bladder incontinence or saddle anesthesia.  No history of IV drug use or active cancer.  On exam patient is laying in bed with a blanket over his head.  He appears to be in no acute discomfort.  He is answering questions appropriately.  Heart with normal rate and rhythm, lungs are clear to auscultation bilaterally abdomen is soft nontender.  Mild lumbar paraspinal muscle tenderness on palpation.  Negative straight leg raise bilaterally.  Patient does have trace edema to bilateral lower extremities.  He has equal 5 out of 5 strength to both lower legs.  Vital signs reviewed and overall reassuring no fever no hypoxia.  -Labs ordered, independently viewed and interpreted by me.  Labs remarkable for result similar to baseline -The patient was maintained on a cardiac monitor.  I personally viewed and interpreted the cardiac monitored which showed an underlying rhythm of: NSR -Imaging considered including abd/pelvis CT but not performed as pt does not have any significant abdominal tenderness.  Considered [place] of lower extremities to r/o DVT but due to bilateral peripheral edema, doubt DVT. -This patient presents to the ED for concern of multiple complaints, this involves an extensive number  of treatment options, and is a complaint that carries with it a high risk of complications and morbidity.  The differential diagnosis includes Cdiff, kidney failure, chf, spinal infection, peripheral edema, functional diarrhea, infectous colitis, thyroid disease -Co morbidities that complicate the patient evaluation includes homelessness, alcohol abuse, hx of PE, afib, anxiety -Treatment includes lasix, tylenol -Reevaluation of the patient after these medicines showed that the patient improved -PCP office notes or outside notes reviewed -Discussion with attending -Escalation to admission/observation considered: patients feels much better, is comfortable with discharge, and will follow up with PCP -Prescription medication considered, patient comfortable with lasix and tylenol -Social Determinant of Health considered which includes homelessness, tobacco use, depression         Final Clinical Impression(s) / ED Diagnoses Final diagnoses:  Diarrhea, unspecified type  Peripheral edema  Chronic bilateral low back pain without sciatica    Rx / DC Orders ED Discharge Orders          Ordered    furosemide (LASIX) 20 MG tablet  Daily        10/01/22 0931    acetaminophen (TYLENOL) 500 MG tablet  Every 6 hours PRN        10/01/22 0931    loperamide (IMODIUM) 2 MG capsule  As needed        10/01/22 0931              Domenic Moras, PA-C 10/01/22 1038    Blanchie Dessert, MD 10/05/22 2113

## 2022-10-01 NOTE — ED Notes (Signed)
Pt ambulated to restroom, steady gait  

## 2022-10-01 NOTE — ED Triage Notes (Signed)
EMS Report: Pt was BIB [facility] from [facility]. Initial call out by staff d/t incorporation and defecating onto floor. Pt was able to transfer self onto stretcher.  No further complaints in transport.   Pt refused VS in transport.
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
## 2022-10-01 NOTE — Discharge Instructions (Addendum)
You have been evaluated for your symptoms.  Continue taking Tylenol as needed for your lower back pain.  Avoid excessive walking as it may worsen your pain.  Take Lasix for the next 3 days to help with swelling to your leg.  Keep your legs elevated when possible which will help with the swelling.  You may take Imodium as needed for diarrhea.  Use resource below to find outpatient support.  Happy belated birthday.

## 2022-10-01 NOTE — ED Notes (Signed)
D/c with instructions. NAD. Stable condition

## 2022-10-01 NOTE — ED Notes (Signed)
[employer] present speaking with pt in waiting area.

## 2022-10-06 ENCOUNTER — Emergency Department (HOSPITAL_COMMUNITY): Payer: 59

## 2022-10-06 ENCOUNTER — Inpatient Hospital Stay (HOSPITAL_COMMUNITY): Payer: 59

## 2022-10-06 ENCOUNTER — Encounter (HOSPITAL_COMMUNITY): Payer: Self-pay

## 2022-10-06 ENCOUNTER — Other Ambulatory Visit: Payer: Self-pay

## 2022-10-06 ENCOUNTER — Inpatient Hospital Stay (HOSPITAL_COMMUNITY)
Admission: EM | Admit: 2022-10-06 | Discharge: 2022-10-09 | DRG: 200 | Disposition: A | Discharge status: Home / Self Care | Payer: 59 | Attending: Surgery | Admitting: Surgery

## 2022-10-06 DIAGNOSIS — F32A Depression, unspecified: Secondary | ICD-10-CM | POA: Diagnosis present

## 2022-10-06 DIAGNOSIS — Z7901 Long term (current) use of anticoagulants: Secondary | ICD-10-CM | POA: Diagnosis not present

## 2022-10-06 DIAGNOSIS — Y908 Blood alcohol level of 240 mg/100 ml or more: Secondary | ICD-10-CM | POA: Diagnosis present

## 2022-10-06 DIAGNOSIS — E876 Hypokalemia: Secondary | ICD-10-CM | POA: Diagnosis present

## 2022-10-06 DIAGNOSIS — R000 Tachycardia, unspecified: Secondary | ICD-10-CM | POA: Diagnosis present

## 2022-10-06 DIAGNOSIS — S2231XA Fracture of one rib, right side, initial encounter for closed fracture: Secondary | ICD-10-CM | POA: Diagnosis present

## 2022-10-06 DIAGNOSIS — Z87440 Personal history of urinary (tract) infections: Secondary | ICD-10-CM | POA: Diagnosis not present

## 2022-10-06 DIAGNOSIS — F10239 Alcohol dependence with withdrawal, unspecified: Secondary | ICD-10-CM | POA: Diagnosis present

## 2022-10-06 DIAGNOSIS — Z86711 Personal history of pulmonary embolism: Secondary | ICD-10-CM

## 2022-10-06 DIAGNOSIS — I10 Essential (primary) hypertension: Secondary | ICD-10-CM | POA: Diagnosis present

## 2022-10-06 DIAGNOSIS — R251 Tremor, unspecified: Secondary | ICD-10-CM | POA: Diagnosis present

## 2022-10-06 DIAGNOSIS — Z8249 Family history of ischemic heart disease and other diseases of the circulatory system: Secondary | ICD-10-CM | POA: Diagnosis not present

## 2022-10-06 DIAGNOSIS — I480 Paroxysmal atrial fibrillation: Secondary | ICD-10-CM | POA: Diagnosis present

## 2022-10-06 DIAGNOSIS — Z7951 Long term (current) use of inhaled steroids: Secondary | ICD-10-CM

## 2022-10-06 DIAGNOSIS — F17290 Nicotine dependence, other tobacco product, uncomplicated: Secondary | ICD-10-CM | POA: Diagnosis present

## 2022-10-06 DIAGNOSIS — I700 Atherosclerosis of aorta: Secondary | ICD-10-CM | POA: Diagnosis present

## 2022-10-06 DIAGNOSIS — R197 Diarrhea, unspecified: Secondary | ICD-10-CM | POA: Diagnosis present

## 2022-10-06 DIAGNOSIS — Z85828 Personal history of other malignant neoplasm of skin: Secondary | ICD-10-CM

## 2022-10-06 DIAGNOSIS — Z8619 Personal history of other infectious and parasitic diseases: Secondary | ICD-10-CM | POA: Diagnosis not present

## 2022-10-06 DIAGNOSIS — G8929 Other chronic pain: Secondary | ICD-10-CM | POA: Diagnosis present

## 2022-10-06 DIAGNOSIS — W08XXXA Fall from other furniture, initial encounter: Secondary | ICD-10-CM | POA: Diagnosis present

## 2022-10-06 DIAGNOSIS — L570 Actinic keratosis: Secondary | ICD-10-CM | POA: Diagnosis present

## 2022-10-06 DIAGNOSIS — S270XXA Traumatic pneumothorax, initial encounter: Secondary | ICD-10-CM | POA: Diagnosis present

## 2022-10-06 DIAGNOSIS — E785 Hyperlipidemia, unspecified: Secondary | ICD-10-CM | POA: Diagnosis present

## 2022-10-06 DIAGNOSIS — I959 Hypotension, unspecified: Secondary | ICD-10-CM | POA: Diagnosis present

## 2022-10-06 DIAGNOSIS — Y92830 Public park as the place of occurrence of the external cause: Secondary | ICD-10-CM

## 2022-10-06 DIAGNOSIS — I4891 Unspecified atrial fibrillation: Secondary | ICD-10-CM | POA: Diagnosis not present

## 2022-10-06 DIAGNOSIS — F1020 Alcohol dependence, uncomplicated: Secondary | ICD-10-CM

## 2022-10-06 DIAGNOSIS — Z79899 Other long term (current) drug therapy: Secondary | ICD-10-CM | POA: Diagnosis not present

## 2022-10-06 DIAGNOSIS — R0602 Shortness of breath: Secondary | ICD-10-CM | POA: Diagnosis present

## 2022-10-06 LAB — CBC WITH DIFFERENTIAL/PLATELET
Abs Immature Granulocytes: 0.01 10*3/uL (ref 0.00–0.07)
Basophils Absolute: 0 10*3/uL (ref 0.0–0.1)
Basophils Relative: 1 %
Eosinophils Absolute: 0.1 10*3/uL (ref 0.0–0.5)
Eosinophils Relative: 2 %
HCT: 30.7 % — ABNORMAL LOW (ref 39.0–52.0)
Hemoglobin: 9.9 g/dL — ABNORMAL LOW (ref 13.0–17.0)
Immature Granulocytes: 0 %
Lymphocytes Relative: 42 %
Lymphs Abs: 2.3 10*3/uL (ref 0.7–4.0)
MCH: 33.1 pg (ref 26.0–34.0)
MCHC: 32.2 g/dL (ref 30.0–36.0)
MCV: 102.7 fL — ABNORMAL HIGH (ref 80.0–100.0)
Monocytes Absolute: 0.4 10*3/uL (ref 0.1–1.0)
Monocytes Relative: 7 %
Neutro Abs: 2.7 10*3/uL (ref 1.7–7.7)
Neutrophils Relative %: 48 %
Platelets: 292 10*3/uL (ref 150–400)
RBC: 2.99 MIL/uL — ABNORMAL LOW (ref 4.22–5.81)
RDW: 15.1 % (ref 11.5–15.5)
WBC: 5.6 10*3/uL (ref 4.0–10.5)
nRBC: 0 % (ref 0.0–0.2)

## 2022-10-06 LAB — COMPREHENSIVE METABOLIC PANEL
ALT: 17 U/L (ref 0–44)
AST: 34 U/L (ref 15–41)
Albumin: 3 g/dL — ABNORMAL LOW (ref 3.5–5.0)
Alkaline Phosphatase: 76 U/L (ref 38–126)
Anion gap: 18 — ABNORMAL HIGH (ref 5–15)
BUN: 11 mg/dL (ref 8–23)
CO2: 13 mmol/L — ABNORMAL LOW (ref 22–32)
Calcium: 8.4 mg/dL — ABNORMAL LOW (ref 8.9–10.3)
Chloride: 97 mmol/L — ABNORMAL LOW (ref 98–111)
Creatinine, Ser: 0.88 mg/dL (ref 0.61–1.24)
GFR, Estimated: >60 mL/min (ref >60)
Glucose, Bld: 147 mg/dL — ABNORMAL HIGH (ref 70–99)
Potassium: 3 mmol/L — ABNORMAL LOW (ref 3.5–5.1)
Sodium: 128 mmol/L — ABNORMAL LOW (ref 135–145)
Total Bilirubin: 0.6 mg/dL (ref 0.3–1.2)
Total Protein: 6.8 g/dL (ref 6.5–8.1)

## 2022-10-06 LAB — RAPID URINE DRUG SCREEN, HOSP PERFORMED
Amphetamines: NOT DETECTED
Barbiturates: NOT DETECTED
Benzodiazepines: POSITIVE — AB
Cocaine: NOT DETECTED
Opiates: NOT DETECTED
Tetrahydrocannabinol: NOT DETECTED

## 2022-10-06 LAB — TROPONIN I (HIGH SENSITIVITY)
Troponin I (High Sensitivity): 11 ng/L (ref <18)
Troponin I (High Sensitivity): 11 ng/L (ref <18)

## 2022-10-06 LAB — ETHANOL: Alcohol, Ethyl (B): 301 mg/dL (ref <10)

## 2022-10-06 LAB — BRAIN NATRIURETIC PEPTIDE: B Natriuretic Peptide: 273.4 pg/mL — ABNORMAL HIGH (ref 0.0–100.0)

## 2022-10-06 MED ORDER — FOLIC ACID 1 MG PO TABS
1.0000 mg | ORAL_TABLET | Freq: Every day | ORAL | Status: DC
  Administered 2022-10-07 – 2022-10-09 (×3): 1 mg via ORAL
  Filled 2022-10-06 (×3): qty 1

## 2022-10-06 MED ORDER — LORAZEPAM 1 MG PO TABS: 0.0000 mg | ORAL_TABLET | Freq: Two times a day (BID) | ORAL | Status: DC

## 2022-10-06 MED ORDER — LORAZEPAM 1 MG PO TABS
0.0000 mg | ORAL_TABLET | Freq: Four times a day (QID) | ORAL | Status: AC
  Administered 2022-10-06: 1 mg via ORAL
  Filled 2022-10-06: qty 1

## 2022-10-06 MED ORDER — LORAZEPAM 2 MG/ML IJ SOLN: 0.0000 mg | Freq: Two times a day (BID) | INTRAMUSCULAR | Status: DC

## 2022-10-06 MED ORDER — THIAMINE MONONITRATE 100 MG PO TABS
100.0000 mg | ORAL_TABLET | Freq: Every day | ORAL | Status: DC
  Administered 2022-10-08 – 2022-10-09 (×2): 100 mg via ORAL
  Filled 2022-10-06 (×2): qty 1

## 2022-10-06 MED ORDER — FENTANYL CITRATE PF 50 MCG/ML IJ SOSY
50.0000 ug | PREFILLED_SYRINGE | Freq: Once | INTRAMUSCULAR | Status: AC
  Administered 2022-10-06: 50 ug via INTRAVENOUS
  Filled 2022-10-06: qty 1

## 2022-10-06 MED ORDER — LORAZEPAM 2 MG/ML IJ SOLN
0.0000 mg | Freq: Four times a day (QID) | INTRAMUSCULAR | Status: AC
  Administered 2022-10-07: 1 mg via INTRAVENOUS
  Filled 2022-10-06: qty 1

## 2022-10-06 MED ORDER — NICOTINE 21 MG/24HR TD PT24
21.0000 mg | MEDICATED_PATCH | Freq: Once | TRANSDERMAL | Status: AC
  Administered 2022-10-06: 21 mg via TRANSDERMAL
  Filled 2022-10-06: qty 1

## 2022-10-06 MED ORDER — MORPHINE SULFATE (PF) 2 MG/ML IV SOLN
1.0000 mg | INTRAVENOUS | Status: DC | PRN
  Administered 2022-10-07 – 2022-10-09 (×2): 2 mg via INTRAVENOUS
  Filled 2022-10-06 (×2): qty 1

## 2022-10-06 MED ORDER — LORAZEPAM 2 MG/ML IJ SOLN
1.0000 mg | Freq: Once | INTRAMUSCULAR | Status: AC
  Administered 2022-10-06: 1 mg via INTRAVENOUS
  Filled 2022-10-06: qty 1

## 2022-10-06 MED ORDER — OXYCODONE HCL 5 MG PO TABS
2.5000 mg | ORAL_TABLET | ORAL | Status: DC | PRN
  Administered 2022-10-07 – 2022-10-09 (×4): 5 mg via ORAL
  Filled 2022-10-06 (×5): qty 1

## 2022-10-06 MED ORDER — IOHEXOL 350 MG/ML SOLN
75.0000 mL | Freq: Once | INTRAVENOUS | Status: AC | PRN
  Administered 2022-10-06: 75 mL via INTRAVENOUS

## 2022-10-06 MED ORDER — ONDANSETRON 4 MG PO TBDP: 4.0000 mg | ORAL_TABLET | Freq: Four times a day (QID) | ORAL | Status: DC | PRN

## 2022-10-06 MED ORDER — THIAMINE HCL 100 MG/ML IJ SOLN
100.0000 mg | Freq: Every day | INTRAMUSCULAR | Status: DC
  Administered 2022-10-07: 100 mg via INTRAVENOUS
  Filled 2022-10-06 (×2): qty 2

## 2022-10-06 MED ORDER — ACETAMINOPHEN 325 MG PO TABS: 650.0000 mg | ORAL_TABLET | Freq: Four times a day (QID) | ORAL | Status: DC | PRN

## 2022-10-06 MED ORDER — KETAMINE HCL 50 MG/5ML IJ SOSY
0.3000 mg/kg | PREFILLED_SYRINGE | Freq: Once | INTRAMUSCULAR | Status: AC
  Administered 2022-10-06: 20 mg via INTRAVENOUS
  Filled 2022-10-06: qty 5

## 2022-10-06 MED ORDER — ACETAMINOPHEN 500 MG PO TABS
1000.0000 mg | ORAL_TABLET | Freq: Four times a day (QID) | ORAL | Status: DC
  Administered 2022-10-06 – 2022-10-09 (×11): 1000 mg via ORAL
  Filled 2022-10-06 (×12): qty 2

## 2022-10-06 MED ORDER — ENOXAPARIN SODIUM 30 MG/0.3ML IJ SOSY
30.0000 mg | PREFILLED_SYRINGE | Freq: Two times a day (BID) | INTRAMUSCULAR | Status: DC
  Administered 2022-10-07 – 2022-10-09 (×5): 30 mg via SUBCUTANEOUS
  Filled 2022-10-06 (×5): qty 0.3

## 2022-10-06 MED ORDER — SPIRITUS FRUMENTI
1.0000 | Freq: Three times a day (TID) | ORAL | Status: DC
  Administered 2022-10-06 – 2022-10-09 (×7): 1 via ORAL
  Filled 2022-10-06 (×14): qty 1

## 2022-10-06 MED ORDER — DOCUSATE SODIUM 100 MG PO CAPS
100.0000 mg | ORAL_CAPSULE | Freq: Two times a day (BID) | ORAL | Status: DC
  Administered 2022-10-06 – 2022-10-09 (×5): 100 mg via ORAL
  Filled 2022-10-06 (×6): qty 1

## 2022-10-06 MED ORDER — LIDOCAINE-EPINEPHRINE (PF) 2 %-1:200000 IJ SOLN
20.0000 mL | Freq: Once | INTRAMUSCULAR | Status: AC
  Administered 2022-10-06: 20 mL
  Filled 2022-10-06: qty 20

## 2022-10-06 MED ORDER — ONDANSETRON HCL 4 MG/2ML IJ SOLN: 4.0000 mg | Freq: Four times a day (QID) | INTRAMUSCULAR | Status: DC | PRN

## 2022-10-06 NOTE — Plan of Care (Signed)

## 2022-10-06 NOTE — ED Notes (Signed)
Pt placed on 2L [place]

## 2022-10-06 NOTE — Evaluation (Signed)
Physical Therapy Evaluation Patient Details Name: Anthony [name] MRN: [id number] DOB: 1956/07/13 Today's Date: 10/06/2022  History of Present Illness  68 y.o. male presents to [facility] hospital on 10/06/2022 after syncopal episode and fall. Imaging shows R 5th rib fx and PTX. PT also requesting help for detox. PMH includes alcohol abuse, housing insecurity, PE, depression, HTN, rhabdomyolysis.  Clinical Impression  Pt presents to PT with deficits in gait, balance, endurance, power. Pt reports pain at R chest and flank, although he denies SOB when ambulating at this time. Pt initially benefits from UE support to stabilize when standing, but progresses to no UE support with increased ambulation distances. Pt will benefit from frequent mobilization in an effort to improve cardiopulmonary function and to reduce risk for future falls.       Recommendations for follow up therapy are one component of a multi-disciplinary discharge planning process, led by the attending physician.  Recommendations may be updated based on patient status, additional functional criteria and insurance authorization.  Follow Up Recommendations No PT follow up      Assistance Recommended at Discharge PRN  Patient can return home with the following  Help with stairs or ramp for entrance;Assist for transportation    Equipment Recommendations Cane  Recommendations for Other Services       Functional Status Assessment Patient has had a recent decline in their functional status and demonstrates the ability to make significant improvements in function in a reasonable and predictable amount of time.     Precautions / Restrictions Precautions Precautions: Fall Precaution Comments: R chest tube Restrictions Weight Bearing Restrictions: No      Mobility  Bed Mobility Overal bed mobility: Modified Independent                  Transfers Overall transfer level: Needs assistance Equipment used: 1 person hand held  assist Transfers: Sit to/from Stand Sit to Stand: Min guard                Ambulation/Gait Ambulation/Gait assistance: Min guard Gait Distance (Feet): 250 Feet Assistive device: None Gait Pattern/deviations: Step-through pattern Gait velocity: reduced Gait velocity interpretation: <1.8 ft/sec, indicate of risk for recurrent falls   General Gait Details: hand hold for initial 15', progresses to no UE support for next 200' mild lateral drift but no overt losses of balance  Stairs            Wheelchair Mobility    Modified Rankin (Stroke Patients Only)       Balance Overall balance assessment: Needs assistance Sitting-balance support: Feet supported, No upper extremity supported Sitting balance-Leahy Scale: Good     Standing balance support: No upper extremity supported, During functional activity Standing balance-Leahy Scale: Fair                               Pertinent Vitals/Pain Pain Assessment Pain Assessment: Faces Faces Pain Scale: Hurts [place] more Pain Location: R flank and chest Pain Descriptors / Indicators: Aching Pain Intervention(s): Monitored during session    Home Living Family/patient expects to be discharged to:: Shelter/Homeless Living Arrangements: Other (Comment)   Type of Home: Other(Comment)           Home Equipment: None Additional Comments: pt is not forthcoming in his living arrangements, reports he [place] in the park prior to fall but does not state this is where he was living    Prior Function Prior Level of  Function : Independent/Modified Independent             Mobility Comments: reports not using DME although previously owned canes and walkers. Reports he easily loses them       Hand Dominance   Dominant Hand: Right    Extremity/Trunk Assessment   Upper Extremity Assessment Upper Extremity Assessment: Overall WFL for tasks assessed    Lower Extremity Assessment Lower Extremity  Assessment: Generalized weakness    Cervical / Trunk Assessment Cervical / Trunk Assessment: Kyphotic  Communication   Communication: No difficulties  Cognition Arousal/Alertness: Awake/alert Behavior During Therapy: WFL for tasks assessed/performed Overall Cognitive Status: No family/caregiver present to determine baseline cognitive functioning                                 General Comments: pt follows commands well, has fair awareness of current balance deficits        General Comments General comments (skin integrity, edema, etc.): VSS on RA, pt denies SOB    Exercises     Assessment/Plan    PT Assessment Patient needs continued PT services  PT Problem List Decreased activity tolerance;Decreased balance;Decreased mobility;Decreased knowledge of use of DME;Pain       PT Treatment Interventions DME instruction;Gait training;Stair training;Functional mobility training;Balance training;Neuromuscular re-education;Patient/family education;Therapeutic activities    PT Goals (Current goals can be found in the Care Plan section)  Acute Rehab PT Goals Patient Stated Goal: to return to independence PT Goal Formulation: With patient Time For Goal Achievement: 10/20/22 Potential to Achieve Goals: Good Additional Goals Additional Goal #1: Pt will score >19/24 on the DGI to indicate a reduced risk for falls    Frequency Min 3X/week     Co-evaluation               AM-PAC PT "6 Clicks" Mobility  Outcome Measure Help needed turning from your back to your side while in a flat bed without using bedrails?: None Help needed moving from lying on your back to sitting on the side of a flat bed without using bedrails?: None Help needed moving to and from a bed to a chair (including a wheelchair)?: A [place] Help needed standing up from a chair using your arms (e.g., wheelchair or bedside chair)?: A [place] Help needed to walk in hospital room?: A Hefty Help needed  climbing 3-5 steps with a railing? : A [place] 6 Click Score: 20    End of Session   Activity Tolerance: Patient tolerated treatment well Patient left: in bed;with call bell/phone within reach;with nursing/sitter in room Nurse Communication: Mobility status PT Visit Diagnosis: Difficulty in walking, not elsewhere classified (R26.2)    Time: [id number] PT Time Calculation (min) (ACUTE ONLY): 25 min   Charges:   PT Evaluation $PT Eval Low Complexity: Ferguson, PT, DPT Acute Rehabilitation Office [phone]   Zenaida Niece 10/06/2022, 2:19 PM

## 2022-10-06 NOTE — H&P (Signed)
Reason for Consult/Chief Complaint: R PTX Consultant: Baird Cancer, PA  Anthony [name] is an 67 y.o. male.   HPI: 34M with h/o alcohol abuse and prior DTs, housing insecurity, unprovoked PE (supposed to be on Xarelto, but is not). Reports attempting to go into the woods, but "never made it" because he passed out. When asked if he passed out from drinking prior, endorses he had been drinking, but "it was not that time of the day yet". Reports landing on his right side. History is difficult to obtain due to tangential speech and majority is obtained from chart review and discussion with ED PA.   Past Medical History:  Diagnosis Date   Actinic keratosis 11/27/2016   Alcohol abuse with alcohol-induced mood disorder ([place]) 07/18/2018   Alcohol use disorder, severe, dependence ([place]) 09/16/2006   05/22/2015 Argumentative, belligerent and verbally abusive to staff per his note from [facility])    Anxiety state 04/25/2018   Aortic atherosclerosis ([place]) 08/27/2022   Chronic low back pain 09/16/2006   Followed by NS.  Per his chart from [place], he fell off two storyhouse roof while cleaning his brother's gutters and sustained compression fracture of L3, L4 and L5 and fracture of right transverse process at L3 and nondisplaced fracture of left glenoid rim many years ago. He had multiple imaging in [place] including Lumbar MRI in 2016 which showed moderate to severe spinal canal stenos   Delirium tremens ([place]) 09/01/2017   Depression    Dry eye 11/23/2016   Foreign body in middle portion of esophagus 05/19/2022   Hiatal hernia 09/14/2016   Status Collis gastroplasty and Nissen's fundoplication on [id number] in [place]   History of delirium tremons 07/22/2020   DTs during 10/2016 08/2017. And 12/2017 admissions    History of pulmonary embolus (PE) 11/02/2016   Unprovoked 11/01/2016. Xarelto from 11/01/16 to 09/08/2017.   Hydrocele    Surgically corrected   Hypertension     Hypoalbuminemia due to protein-calorie malnutrition ([place]) 07/22/2020   Lumbar compression fracture ([facility])    Multiple rib fractures 07/22/2019   Left rib details XR: left ribs demonstrate multiple remote rib   Pancytopenia ([place])    Rhabdomyolysis 07/22/2020   Skin cancer    exciced 2017   Tinea versicolor 06/08/2013   Tobacco use disorder 07/22/2020   Tooth infection 04/25/2018   Trigger finger, acquired 11/18/2012   Ulnar tunnel syndrome of right wrist 11/08/2012    Past Surgical History:  Procedure Laterality Date   BIOPSY  05/21/2022   Procedure: BIOPSY;  Surgeon: [name], MD;  Location: [facility] ENDOSCOPY;  Service: Gastroenterology;;   CATARACT EXTRACTION     ESOPHAGOGASTRODUODENOSCOPY (EGD) WITH PROPOFOL N/A 05/21/2022   Procedure: ESOPHAGOGASTRODUODENOSCOPY (EGD) WITH PROPOFOL;  Surgeon: [name], MD;  Location: [place];  Service: Gastroenterology;  Laterality: N/A;   FOREIGN BODY REMOVAL N/A 05/21/2022   Procedure: FOREIGN BODY REMOVAL;  Surgeon: [name], MD;  Location: [place];  Service: Gastroenterology;  Laterality: N/A;   HIATAL HERNIA REPAIR  2016   HYDROCELE EXCISION / REPAIR  2010   SKIN CANCER EXCISION Left    Excised 2017    Family History  Problem Relation Age of Onset   Heart attack Father        died at 28 years   Dementia Father    Stroke Father    Cancer Sister    Colon cancer Neg Hx    Colon polyps Neg Hx    Esophageal  cancer Neg Hx    Stomach cancer Neg Hx    Rectal cancer Neg Hx     Social History:  reports that he has never smoked. His smokeless tobacco use includes snuff. He reports current alcohol use of about 43.0 standard drinks of alcohol per week. He reports current drug use. Drug: Marijuana.  Allergies:  Allergies  Allergen Reactions   Other Itching and Other (See Comments)    Seasonal allergies- Itchy eyes, runny nose, congestion   Poison Ivy Extract Rash    Medications: I have reviewed the patient's current  medications.  Results for orders placed or performed during the hospital encounter of 10/06/22 (from the past 48 hour(s))  CBC with Differential     Status: Abnormal   Collection Time: 10/06/22  2:16 AM  Result Value Ref Range   WBC 5.6 4.0 - 10.5 K/uL   RBC 2.99 (L) 4.22 - 5.81 MIL/uL   Hemoglobin 9.9 (L) 13.0 - 17.0 g/dL   HCT 30.7 (L) 39.0 - 52.0 %   MCV 102.7 (H) 80.0 - 100.0 fL   MCH 33.1 26.0 - 34.0 pg   MCHC 32.2 30.0 - 36.0 g/dL   RDW 15.1 11.5 - 15.5 %   Platelets 292 150 - 400 K/uL   nRBC 0.0 0.0 - 0.2 %   Neutrophils Relative % 48 %   Neutro Abs 2.7 1.7 - 7.7 K/uL   Lymphocytes Relative 42 %   Lymphs Abs 2.3 0.7 - 4.0 K/uL   Monocytes Relative 7 %   Monocytes Absolute 0.4 0.1 - 1.0 K/uL   Eosinophils Relative 2 %   Eosinophils Absolute 0.1 0.0 - 0.5 K/uL   Basophils Relative 1 %   Basophils Absolute 0.0 0.0 - 0.1 K/uL   Immature Granulocytes 0 %   Abs Immature Granulocytes 0.01 0.00 - 0.07 K/uL    Comment: Performed at [facility], [address], [place], [place] [address]  Comprehensive metabolic panel     Status: Abnormal   Collection Time: 10/06/22  2:16 AM  Result Value Ref Range   Sodium 128 (L) 135 - 145 mmol/L   Potassium 3.0 (L) 3.5 - 5.1 mmol/L   Chloride 97 (L) 98 - 111 mmol/L   CO2 13 (L) 22 - 32 mmol/L   Glucose, Bld 147 (H) 70 - 99 mg/dL    Comment: Glucose reference range applies only to samples taken after fasting for at least 8 hours.   BUN 11 8 - 23 mg/dL   Creatinine, Ser 0.88 0.61 - 1.24 mg/dL   Calcium 8.4 (L) 8.9 - 10.3 mg/dL   Total Protein 6.8 6.5 - 8.1 g/dL   Albumin 3.0 (L) 3.5 - 5.0 g/dL   AST 34 15 - 41 U/L   ALT 17 0 - 44 U/L   Alkaline Phosphatase 76 38 - 126 U/L   Total Bilirubin 0.6 0.3 - 1.2 mg/dL   GFR, Estimated >60 >60 mL/min    Comment: (NOTE) Calculated using the CKD-EPI Creatinine Equation (2021)    Anion gap 18 (H) 5 - 15    Comment: Performed at [facility], [place] [address] [place], [place] [address]   Troponin I (High Sensitivity)     Status: None   Collection Time: 10/06/22  2:16 AM  Result Value Ref Range   Troponin I (High Sensitivity) 11 <18 ng/L    Comment: (NOTE) Elevated high sensitivity troponin I (hsTnI) values and significant  changes across serial measurements may  suggest ACS but many other  chronic and acute conditions are known to elevate hsTnI results.  Refer to the "Links" section for chest pain algorithms and additional  guidance. Performed at [facility], [place] [address] [place], [place] [address]   Brain natriuretic peptide     Status: Abnormal   Collection Time: 10/06/22  2:16 AM  Result Value Ref Range   B Natriuretic Peptide 273.4 (H) 0.0 - 100.0 pg/mL    Comment: Performed at [facility] [address] [place], [place] [address]  Ethanol     Status: Abnormal   Collection Time: 10/06/22  2:16 AM  Result Value Ref Range   Alcohol, Ethyl (B) 301 (HH) <10 mg/dL    Comment: CRITICAL RESULT CALLED TO, READ BACK BY AND VERIFIED WITH [facility] PARAMEDIC 10/06/22 0316 M KOROLESKI (NOTE) Lowest detectable limit for serum alcohol is 10 mg/dL.  For medical purposes only. Performed at [facility], [place] [address], [place], [place] [address]   Troponin I (High Sensitivity)     Status: None   Collection Time: 10/06/22  3:37 AM  Result Value Ref Range   Troponin I (High Sensitivity) 11 <18 ng/L    Comment: (NOTE) Elevated high sensitivity troponin I (hsTnI) values and significant  changes across serial measurements may suggest ACS but many other  chronic and acute conditions are known to elevate hsTnI results.  Refer to the "Links" section for chest pain algorithms and additional  guidance. Performed at [facility], [place] [address], [place], [place] [address]     CT Chest W Contrast  Result Date: 10/06/2022 CLINICAL DATA:  67 year old male with shortness of breath. Requesting detox. Blunt trauma. Fall on Eliquis. Right  pneumothorax status post pigtail chest tube placement. EXAM: CT CHEST WITH CONTRAST TECHNIQUE: Multidetector CT imaging of the chest was performed during intravenous contrast administration. RADIATION DOSE REDUCTION: This exam was performed according to the departmental dose-optimization program which includes automated exposure control, adjustment of the mA and/or kV according to patient size and/or use of iterative reconstruction technique. CONTRAST:  55mL OMNIPAQUE IOHEXOL 350 MG/ML SOLN COMPARISON:  Cervical spine CT today. Recent chest CTA 09/20/2022, and portable chest x-ray 0420 hours today. CT Abdomen and Pelvis 05/20/2022. FINDINGS: Cardiovascular: Chronic calcified coronary artery atherosclerosis and/or stents. Cardiac size is stable and within normal limits. Negative thoracic aorta aside from some atherosclerosis. Central pulmonary arteries appear to remain patent. No pericardial effusion. Mediastinum/Nodes: Stable.  No mediastinal mass or lymphadenopathy. Lungs/Pleura: Pigtail right chest tube enters the right anterior pleural space via the 5/6 intercostal space. The medial confluent and platelike right lung atelectasis and small volume residual pneumothorax at the apex. Layering fluid or secretions in the right bronchus intermedius series 4, image 80) but otherwise the major airways are patent. No pleural effusion. Mild dependent atelectasis in both lungs. Mild chronic left lower lobe scarring. No other acute pulmonary finding. Upper Abdomen: No free air or free fluid in the visible abdomen. Mostly noncontrast liver, gallbladder, spleen, pancreas, adrenal glands and kidneys appear stable and negative. Chronic postoperative changes to the gastroesophageal junction and the gastrosplenic ligament. No dilated bowel in the upper abdomen Musculoskeletal: Chronic rib fractures. Osteopenia. Acute nondisplaced right lateral 5th rib fracture on series 4, image 58 is new since 09/20/2022. No acute displaced rib  fracture. Small volume pneumothorax and/or chest tube related right chest wall subcutaneous gas. Intact sternum and visible shoulder osseous structures. Stable thoracic vertebrae including mild T2  it and T3 superior endplate compression. Chronic L2 through L4 compression fractures appear stable from the CT Abdomen and Pelvis last year. IMPRESSION: 1. Acute nondisplaced right lateral 5th rib fracture. Underlying chronic rib fractures. Chronic compression fractures. 2. Right chest tube in place with small volume residual right pneumothorax. No pleural effusion. Small volume aspirated or retained secretions in the right bronchus intermedius. Atelectasis but no convincing pneumonia. 3. No other acute traumatic injury identified in the chest. 4. Chronic calcified coronary artery and Aortic Atherosclerosis (ICD10-I70.0). Electronically Signed   By: Genevie Ann M.D.   On: 10/06/2022 06:08   CT Cervical Spine Wo Contrast  Result Date: 10/06/2022 CLINICAL DATA:  67 year old male with shortness of breath. Requesting detox. Blunt trauma. Fall on Eliquis. EXAM: CT CERVICAL SPINE WITHOUT CONTRAST TECHNIQUE: Multidetector CT imaging of the cervical spine was performed without intravenous contrast. Multiplanar CT image reconstructions were also generated. RADIATION DOSE REDUCTION: This exam was performed according to the departmental dose-optimization program which includes automated exposure control, adjustment of the mA and/or kV according to patient size and/or use of iterative reconstruction technique. COMPARISON:  Head and chest CT today reported separately. Prior cervical spine CT 09/20/2022. FINDINGS: Alignment: Stable. Straightening of cervical lordosis with up to the 4 mm of degenerative appearing anterolisthesis of C3 on C4, subtle retrolisthesis of C4 on C5. Cervicothoracic junction alignment is within normal limits. Bilateral posterior element alignment is within normal limits. Skull base and vertebrae: Visualized  skull base is intact. No atlanto-occipital dissociation. C1 and C2 appears stable and intact. No acute osseous abnormality identified. Soft tissues and spinal canal: No prevertebral fluid or swelling. No visible canal hematoma. Calcified carotid atherosclerosis in the neck. Otherwise negative visible noncontrast neck soft tissues. Disc levels: Advanced upper cervical facet arthropathy C2 through C4 in the setting of chronic spondylolisthesis. Advanced disc and endplate degeneration throughout the cervical spine. Multilevel mild cervical spinal stenosis suspected, chronic. Upper chest: There is a small right apical pneumothorax which is new from earlier this month. See chest CT reported separately. IMPRESSION: 1. Small right apical pneumothorax, see Chest CT reported separately. 2. No acute traumatic injury identified in the cervical spine. 3. Stable advanced cervical spine degeneration, including chronic spondylolisthesis at C3-C4. Electronically Signed   By: Genevie Ann M.D.   On: 10/06/2022 05:58   CT HEAD WO CONTRAST (5MM)  Result Date: 10/06/2022 CLINICAL DATA:  67 year old male with shortness of breath. Requesting detox. Blunt trauma. Fall on Eliquis. EXAM: CT HEAD WITHOUT CONTRAST TECHNIQUE: Contiguous axial images were obtained from the base of the skull through the vertex without intravenous contrast. RADIATION DOSE REDUCTION: This exam was performed according to the departmental dose-optimization program which includes automated exposure control, adjustment of the mA and/or kV according to patient size and/or use of iterative reconstruction technique. COMPARISON:  Head CT 07/19/2022.  Brain MRI 08/16/2020. FINDINGS: Brain: No midline shift, ventriculomegaly, mass effect, evidence of mass lesion, intracranial hemorrhage or evidence of cortically based acute infarction. Suspect some generalized cerebral volume loss for age, not significantly changed from last year. And possible disproportionate atrophy of the  cerebellar vermis. But gray-white differentiation is stable and within normal limits for age. Vascular: Calcified atherosclerosis at the skull base. No suspicious intracranial vascular hyperdensity. Skull: No acute osseous abnormality identified. Cervical spine detailed separately. Sinuses/Orbits: Resolved sinus fluid seen last year. Visualized paranasal sinuses and mastoids are well aerated. Other: No acute orbit or scalp soft tissue injury identified. IMPRESSION: 1. No acute intracranial abnormality or acute traumatic  injury identified. 2. Cervical spine CT detailed separately. Electronically Signed   By: Genevie Ann M.D.   On: 10/06/2022 05:55   DG Chest Portable 1 View  Result Date: 10/06/2022 CLINICAL DATA:  67 year old male status post chest tube placement. EXAM: PORTABLE CHEST 1 VIEW COMPARISON:  Chest x-ray 10/06/2022. FINDINGS: Interval placement of a small bore right-sided chest tube with the tip reformed over the lower right hemithorax. Previously noted right-sided pneumothorax has substantially decreased, now small occupying approximately 5% of the volume of the right hemithorax. There is subcutaneous emphysema in the right chest wall laterally. Lungs appear clear bilaterally. No left pneumothorax. No pleural effusions. No evidence of pulmonary edema. Heart size is normal. Upper mediastinal contours are within normal limits. Mild elevation of the right hemidiaphragm. IMPRESSION: 1. Interval placement of right-sided chest tube with near complete resolution of the previously noted right-sided pneumothorax, as above. Electronically Signed   By: Vinnie Langton M.D.   On: 10/06/2022 05:11   DG Chest 2 View  Result Date: 10/06/2022 CLINICAL DATA:  Dyspnea EXAM: CHEST - 2 VIEW COMPARISON:  09/20/2022 FINDINGS: Large right pneumothorax has developed collapse of the right lung. Minimal subcutaneous gas within the right chest wall. No definite radiographic evidence of tension physiology. Left lung is clear.  No pneumothorax on the left. No pleural effusion. Cardiac size within normal limits. Pulmonary vascularity is normal. IMPRESSION: 1. Large right pneumothorax. No definite radiographic evidence of tension physiology. Electronically Signed   By: Fidela Salisbury M.D.   On: 10/06/2022 02:43    ROS 10 point review of systems is negative except as listed above in HPI.   Physical Exam Blood pressure 112/78, pulse 88, temperature 98.1 F (36.7 C), temperature source Oral, resp. rate 14, height 5\' 8"  (1.727 m), weight 68 kg, SpO2 98 %. Constitutional: well-developed, well-nourished HEENT: pupils equal, round, reactive to light, 71mm b/l, moist conjunctiva, external inspection of ears and nose normal, hearing intact Oropharynx: normal oropharyngeal mucosa, poor dentition Neck: no thyromegaly, trachea midline, no midline cervical tenderness to palpation Chest: breath sounds equal bilaterally, normal respiratory effort, no midline or lateral chest wall tenderness to palpation/deformity Abdomen: soft, NT, no bruising, no hepatosplenomegaly GU: no blood at urethral meatus of penis, no scrotal masses or abnormality  Back: no wounds, no thoracic/lumbar spine tenderness to palpation, no thoracic/lumbar spine stepoffs Rectal: deferred Extremities: 2+ radial and pedal pulses bilaterally, intact motor and sensation bilateral UE and LE, no peripheral edema MSK: normal gait/station, no clubbing/cyanosis of fingers/toes, normal ROM of all four extremities Skin: warm, dry, no rashes Psych:  poor memory, tangential speech      Assessment/Plan: 41M s/p GLF  R 5th rib fx and PTX - CT placed by EDP, to sxn, repeat CXR in AM EtOH abuse - spiritus frumenti until dispo determined. Reports drinking beer/vodka. May need to d/c if SNF/group home is ultimate dispo. CIWA, TOC c/s. PT/OT. FEN - reg diet DVT - SCDs, LMWH Dispo - medsurg   Jesusita Oka, MD General and [facility]

## 2022-10-06 NOTE — Progress Notes (Signed)
OT Cancellation Note  Patient Details Name: Anthony [name] MRN: [id number] DOB: 03-24-1956   Cancelled Treatment:    Reason Eval/Treat Not Completed: Patient sleeping soundly, with continue efforts as appropriate.    Barak Bialecki D Jacarius Handel 10/06/2022, 4:40 PM 10/06/2022  RP, OTR/L  Acute Rehabilitation Services  Office:  [phone]

## 2022-10-06 NOTE — ED Provider Triage Note (Signed)
Emergency Medicine Provider Triage Evaluation Note  Anthony [name] , a 67 y.o. male  was evaluated in triage.  Pt complains of SOB.  Called EMS from store due to this.  Also is cold and requesting help with alcohol detox.  Last drink earlier this evening.  No vomiting.  Denies fever.  Review of Systems  Positive: SOB Negative: fever  Physical Exam  BP 109/81   Pulse 96   Temp (!) 97.5 F (36.4 C) (Oral)   Resp 18   SpO2 96%   Gen:   Awake, no distress   Resp:  Normal effort  MSK:   Moves extremities without difficulty  Other:    Medical Decision Making  Medically screening exam initiated at 1:59 AM.  Appropriate orders placed.  Anthony [name] was informed that the remainder of the evaluation will be completed by another provider, this initial triage assessment does not replace that evaluation, and the importance of remaining in the ED until their evaluation is complete.  SOB.  Also wants help with detox.  VSS on RA.  EKG, labs, CXR.   Larene Pickett, PA-C 10/06/22 [phone]

## 2022-10-06 NOTE — ED Provider Notes (Signed)
[facility] Provider Note   CSN: [id number] Arrival date & time: 10/06/22  0156     History  Chief Complaint  Patient presents with   Shortness of Breath    Anthony [name] is a 67 y.o. male.  The history is provided by the patient and medical records.  Shortness of Breath  67 y.o. M with hx of alcohol abuse, HTN, depression, anxiety, presenting to the ED with SOB.  Patient called EMS from convenience store due to SOB.  He states "I have been admitted multiple times for this, you guys just keep kicking me out."  He also reports that he rolled off a park bench yesterday while sleeping, did hit his head.  He is also requesting help with detox from alcohol.  Patient is supposed to be anticoagulated with xarelto-- unclear if he is actually taking his medications or not.  Home Medications Prior to Admission medications   Medication Sig Start Date End Date Taking? Authorizing Provider  acetaminophen (TYLENOL) 500 MG tablet Take 1-2 tablets (500-1,000 mg total) by mouth every 6 (six) hours as needed for mild pain or moderate pain (for pain, headaches, or dental pain). 10/01/22   Domenic Moras, PA-C  budesonide-formoterol [facility]) 160-4.5 MCG/ACT inhaler Inhale 2 puffs into the lungs 2 (two) times daily.    [provider]  cyanocobalamin 1000 MCG tablet Take 1 tablet (1,000 mcg total) by mouth daily. 09/18/22 10/18/22  Alma Friendly, MD  famotidine (PEPCID) 20 MG tablet Take 1 tablet (20 mg total) by mouth daily. 09/18/22 10/18/22  Alma Friendly, MD  ferrous sulfate 325 (65 FE) MG tablet Take 1 tablet (325 mg total) by mouth daily with breakfast. 09/18/22 10/18/22  Alma Friendly, MD  folic acid (FOLVITE) 1 MG tablet Take 1 tablet (1 mg total) by mouth daily. 09/06/22   Amin, Jeanella Flattery, MD  furosemide (LASIX) 20 MG tablet Take 1 tablet (20 mg total) by mouth daily. 10/01/22   Domenic Moras, PA-C  loperamide (IMODIUM) 2 MG capsule  Take 2 capsules (4 mg total) by mouth as needed for diarrhea or loose stools. 10/01/22   Domenic Moras, PA-C  Magnesium Oxide 400 MG CAPS Take 1 capsule (400 mg total) by mouth 2 (two) times daily. 09/22/22   Karmen Bongo, MD  metoprolol tartrate (LOPRESSOR) 25 MG tablet Take 1/2 tablet (12.5 mg total) by mouth 2 (two) times daily. 09/17/22 10/17/22  Alma Friendly, MD  rivaroxaban (XARELTO) 20 MG TABS tablet Take 1 tablet (20 mg) by mouth daily with supper. 09/17/22 10/17/22  Alma Friendly, MD  rosuvastatin (CRESTOR) 10 MG tablet Take 1 tablet (10 mg total) by mouth daily. 09/05/22   Damita Lack, MD      Allergies    Other and Poison ivy extract    Review of Systems   Review of Systems  Respiratory:  Positive for shortness of breath.   All other systems reviewed and are negative.   Physical Exam Updated Vital Signs BP 113/83   Pulse 96   Temp (!) 97.5 F (36.4 C) (Oral)   Resp 18   Ht 5\' 8"  (1.727 m)   Wt 68 kg   SpO2 100%   BMI 22.81 kg/m   Physical Exam Vitals and nursing note reviewed.  Constitutional:      Appearance: He is well-developed.  HENT:     Head: Normocephalic and atraumatic.     Comments: Contusion right forehead Eyes:  Conjunctiva/sclera: Conjunctivae normal.     Pupils: Pupils are equal, round, and reactive to light.  Cardiovascular:     Rate and Rhythm: Normal rate and regular rhythm.     Heart sounds: Normal heart sounds.  Pulmonary:     Effort: Pulmonary effort is normal.     Breath sounds: Normal breath sounds.  Chest:     Comments: Some tenderness along right mid/lateral ribs, no acute deformity, no bruising Abdominal:     General: Bowel sounds are normal.     Palpations: Abdomen is soft.  Musculoskeletal:        General: Normal range of motion.     Cervical back: Normal range of motion.  Skin:    General: Skin is warm and dry.  Neurological:     Mental Status: He is alert and oriented to person, place, and time.      Comments: Awake, alert, seems intoxicated, slurring his words a bit  Psychiatric:     Comments: Agitated, argumentative during exam     ED Results / Procedures / Treatments   Labs (all labs ordered are listed, but only abnormal results are displayed) Labs Reviewed  CBC WITH DIFFERENTIAL/PLATELET - Abnormal; Notable for the following components:      Result Value   RBC 2.99 (*)    Hemoglobin 9.9 (*)    HCT 30.7 (*)    MCV 102.7 (*)    All other components within normal limits  COMPREHENSIVE METABOLIC PANEL - Abnormal; Notable for the following components:   Sodium 128 (*)    Potassium 3.0 (*)    Chloride 97 (*)    CO2 13 (*)    Glucose, Bld 147 (*)    Calcium 8.4 (*)    Albumin 3.0 (*)    Anion gap 18 (*)    All other components within normal limits  BRAIN NATRIURETIC PEPTIDE - Abnormal; Notable for the following components:   B Natriuretic Peptide 273.4 (*)    All other components within normal limits  ETHANOL - Abnormal; Notable for the following components:   Alcohol, Ethyl (B) 301 (*)    All other components within normal limits  RAPID URINE DRUG SCREEN, HOSP PERFORMED  TROPONIN I (HIGH SENSITIVITY)  TROPONIN I (HIGH SENSITIVITY)    EKG None  Radiology CT Chest W Contrast  Result Date: 10/06/2022 CLINICAL DATA:  67 year old male with shortness of breath. Requesting detox. Blunt trauma. Fall on Eliquis. Right pneumothorax status post pigtail chest tube placement. EXAM: CT CHEST WITH CONTRAST TECHNIQUE: Multidetector CT imaging of the chest was performed during intravenous contrast administration. RADIATION DOSE REDUCTION: This exam was performed according to the departmental dose-optimization program which includes automated exposure control, adjustment of the mA and/or kV according to patient size and/or use of iterative reconstruction technique. CONTRAST:  51mL OMNIPAQUE IOHEXOL 350 MG/ML SOLN COMPARISON:  Cervical spine CT today. Recent chest CTA 09/20/2022, and  portable chest x-ray 0420 hours today. CT Abdomen and Pelvis 05/20/2022. FINDINGS: Cardiovascular: Chronic calcified coronary artery atherosclerosis and/or stents. Cardiac size is stable and within normal limits. Negative thoracic aorta aside from some atherosclerosis. Central pulmonary arteries appear to remain patent. No pericardial effusion. Mediastinum/Nodes: Stable.  No mediastinal mass or lymphadenopathy. Lungs/Pleura: Pigtail right chest tube enters the right anterior pleural space via the 5/6 intercostal space. The medial confluent and platelike right lung atelectasis and small volume residual pneumothorax at the apex. Layering fluid or secretions in the right bronchus intermedius series 4, image 80) but otherwise the  major airways are patent. No pleural effusion. Mild dependent atelectasis in both lungs. Mild chronic left lower lobe scarring. No other acute pulmonary finding. Upper Abdomen: No free air or free fluid in the visible abdomen. Mostly noncontrast liver, gallbladder, spleen, pancreas, adrenal glands and kidneys appear stable and negative. Chronic postoperative changes to the gastroesophageal junction and the gastrosplenic ligament. No dilated bowel in the upper abdomen Musculoskeletal: Chronic rib fractures. Osteopenia. Acute nondisplaced right lateral 5th rib fracture on series 4, image 58 is new since 09/20/2022. No acute displaced rib fracture. Small volume pneumothorax and/or chest tube related right chest wall subcutaneous gas. Intact sternum and visible shoulder osseous structures. Stable thoracic vertebrae including mild T2 it and T3 superior endplate compression. Chronic L2 through L4 compression fractures appear stable from the CT Abdomen and Pelvis last year. IMPRESSION: 1. Acute nondisplaced right lateral 5th rib fracture. Underlying chronic rib fractures. Chronic compression fractures. 2. Right chest tube in place with small volume residual right pneumothorax. No pleural effusion.  Small volume aspirated or retained secretions in the right bronchus intermedius. Atelectasis but no convincing pneumonia. 3. No other acute traumatic injury identified in the chest. 4. Chronic calcified coronary artery and Aortic Atherosclerosis (ICD10-I70.0). Electronically Signed   By: Genevie Ann M.D.   On: 10/06/2022 06:08   CT Cervical Spine Wo Contrast  Result Date: 10/06/2022 CLINICAL DATA:  67 year old male with shortness of breath. Requesting detox. Blunt trauma. Fall on Eliquis. EXAM: CT CERVICAL SPINE WITHOUT CONTRAST TECHNIQUE: Multidetector CT imaging of the cervical spine was performed without intravenous contrast. Multiplanar CT image reconstructions were also generated. RADIATION DOSE REDUCTION: This exam was performed according to the departmental dose-optimization program which includes automated exposure control, adjustment of the mA and/or kV according to patient size and/or use of iterative reconstruction technique. COMPARISON:  Head and chest CT today reported separately. Prior cervical spine CT 09/20/2022. FINDINGS: Alignment: Stable. Straightening of cervical lordosis with up to the 4 mm of degenerative appearing anterolisthesis of C3 on C4, subtle retrolisthesis of C4 on C5. Cervicothoracic junction alignment is within normal limits. Bilateral posterior element alignment is within normal limits. Skull base and vertebrae: Visualized skull base is intact. No atlanto-occipital dissociation. C1 and C2 appears stable and intact. No acute osseous abnormality identified. Soft tissues and spinal canal: No prevertebral fluid or swelling. No visible canal hematoma. Calcified carotid atherosclerosis in the neck. Otherwise negative visible noncontrast neck soft tissues. Disc levels: Advanced upper cervical facet arthropathy C2 through C4 in the setting of chronic spondylolisthesis. Advanced disc and endplate degeneration throughout the cervical spine. Multilevel mild cervical spinal stenosis suspected,  chronic. Upper chest: There is a small right apical pneumothorax which is new from earlier this month. See chest CT reported separately. IMPRESSION: 1. Small right apical pneumothorax, see Chest CT reported separately. 2. No acute traumatic injury identified in the cervical spine. 3. Stable advanced cervical spine degeneration, including chronic spondylolisthesis at C3-C4. Electronically Signed   By: Genevie Ann M.D.   On: 10/06/2022 05:58   CT HEAD WO CONTRAST (5MM)  Result Date: 10/06/2022 CLINICAL DATA:  67 year old male with shortness of breath. Requesting detox. Blunt trauma. Fall on Eliquis. EXAM: CT HEAD WITHOUT CONTRAST TECHNIQUE: Contiguous axial images were obtained from the base of the skull through the vertex without intravenous contrast. RADIATION DOSE REDUCTION: This exam was performed according to the departmental dose-optimization program which includes automated exposure control, adjustment of the mA and/or kV according to patient size and/or use of iterative reconstruction technique.  COMPARISON:  Head CT 07/19/2022.  Brain MRI 08/16/2020. FINDINGS: Brain: No midline shift, ventriculomegaly, mass effect, evidence of mass lesion, intracranial hemorrhage or evidence of cortically based acute infarction. Suspect some generalized cerebral volume loss for age, not significantly changed from last year. And possible disproportionate atrophy of the cerebellar vermis. But gray-white differentiation is stable and within normal limits for age. Vascular: Calcified atherosclerosis at the skull base. No suspicious intracranial vascular hyperdensity. Skull: No acute osseous abnormality identified. Cervical spine detailed separately. Sinuses/Orbits: Resolved sinus fluid seen last year. Visualized paranasal sinuses and mastoids are well aerated. Other: No acute orbit or scalp soft tissue injury identified. IMPRESSION: 1. No acute intracranial abnormality or acute traumatic injury identified. 2. Cervical spine CT  detailed separately. Electronically Signed   By: Genevie Ann M.D.   On: 10/06/2022 05:55   DG Chest Portable 1 View  Result Date: 10/06/2022 CLINICAL DATA:  67 year old male status post chest tube placement. EXAM: PORTABLE CHEST 1 VIEW COMPARISON:  Chest x-ray 10/06/2022. FINDINGS: Interval placement of a small bore right-sided chest tube with the tip reformed over the lower right hemithorax. Previously noted right-sided pneumothorax has substantially decreased, now small occupying approximately 5% of the volume of the right hemithorax. There is subcutaneous emphysema in the right chest wall laterally. Lungs appear clear bilaterally. No left pneumothorax. No pleural effusions. No evidence of pulmonary edema. Heart size is normal. Upper mediastinal contours are within normal limits. Mild elevation of the right hemidiaphragm. IMPRESSION: 1. Interval placement of right-sided chest tube with near complete resolution of the previously noted right-sided pneumothorax, as above. Electronically Signed   By: Vinnie Langton M.D.   On: 10/06/2022 05:11   DG Chest 2 View  Result Date: 10/06/2022 CLINICAL DATA:  Dyspnea EXAM: CHEST - 2 VIEW COMPARISON:  09/20/2022 FINDINGS: Large right pneumothorax has developed collapse of the right lung. Minimal subcutaneous gas within the right chest wall. No definite radiographic evidence of tension physiology. Left lung is clear. No pneumothorax on the left. No pleural effusion. Cardiac size within normal limits. Pulmonary vascularity is normal. IMPRESSION: 1. Large right pneumothorax. No definite radiographic evidence of tension physiology. Electronically Signed   By: Fidela Salisbury M.D.   On: 10/06/2022 02:43    Procedures CHEST TUBE INSERTION  Date/Time: 10/06/2022 4:16 AM  Performed by: Larene Pickett, PA-C Authorized by: Larene Pickett, PA-C   Consent:    Consent obtained:  Verbal   Consent given by:  Patient   Risks, benefits, and alternatives were discussed: yes      Risks discussed:  Bleeding, incomplete drainage and pain Universal protocol:    Patient identity confirmed:  Verbally with patient, hospital-assigned identification number and arm band Pre-procedure details:    Skin preparation:  Chlorhexidine   Preparation: Patient was prepped and draped in the usual sterile fashion   Sedation:    Sedation type:  Anxiolysis Anesthesia:    Anesthesia method:  Local infiltration   Local anesthetic:  Lidocaine 2% WITH epi Procedure details:    Placement location:  R lateral   Scalpel size:  11   Tube size (French): 14.   Ultrasound guidance: no     Tension pneumothorax: no     Drainage characteristics:  Air only   Suture material:  2-0 silk   Dressing:  4x4 sterile gauze Post-procedure details:    Post-insertion x-ray findings: tube in good position     Procedure completion:  Tolerated     CRITICAL CARE Performed by: Lattie Haw  Chalmers Guest   Total critical care time: 45 minutes  Critical care time was exclusive of separately billable procedures and treating other patients.  Critical care was necessary to treat or prevent imminent or life-threatening deterioration.  Critical care was time spent personally by me on the following activities: development of treatment plan with patient and/or surrogate as well as nursing, discussions with consultants, evaluation of patient's response to treatment, examination of patient, obtaining history from patient or surrogate, ordering and performing treatments and interventions, ordering and review of laboratory studies, ordering and review of radiographic studies, pulse oximetry and re-evaluation of patient's condition.   Medications Ordered in ED Medications  acetaminophen (TYLENOL) tablet 650 mg (has no administration in time range)  nicotine (NICODERM CQ - dosed in mg/24 hours) patch 21 mg (21 mg Transdermal Patch Applied 10/06/22 0500)  LORazepam (ATIVAN) injection 0-4 mg (0 mg Intravenous Hold 10/06/22 0559)     Or  LORazepam (ATIVAN) tablet 0-4 mg ( Oral See Alternative 10/06/22 0559)  LORazepam (ATIVAN) injection 0-4 mg (has no administration in time range)    Or  LORazepam (ATIVAN) tablet 0-4 mg (has no administration in time range)  thiamine (VITAMIN B1) tablet 100 mg (has no administration in time range)    Or  thiamine (VITAMIN B1) injection 100 mg (has no administration in time range)  ketamine 50 mg in normal saline 5 mL (10 mg/mL) syringe (20 mg Intravenous Given 10/06/22 0358)  lidocaine-EPINEPHrine (XYLOCAINE W/EPI) 2 %-1:200000 (PF) injection 20 mL (20 mLs Infiltration Given 10/06/22 0358)  fentaNYL (SUBLIMAZE) injection 50 mcg (50 mcg Intravenous Given 10/06/22 0501)  iohexol (OMNIPAQUE) 350 MG/ML injection 75 mL (75 mLs Intravenous Contrast Given 10/06/22 0542)  LORazepam (ATIVAN) injection 1 mg (1 mg Intravenous Given 10/06/22 0602)    ED Course/ Medical Decision Making/ A&P                             Medical Decision Making Amount and/or Complexity of Data Reviewed Labs: ordered. Radiology: ordered and independent interpretation performed. ECG/medicine tests: ordered and independent interpretation performed.  Risk OTC drugs. Prescription drug management.   67 year old male [place] from gas station with shortness of breath.  He denies any chest pain.  He does report a fall off of a park bench last night.  He did hit his head, unclear loss of consciousness.  He is supposed to be anticoagulated with Xarelto but unclear if he was actually taking this.  He is awake, alert, intoxicated.  Labs and chest x-ray obtained from triage--does have right-sided pneumothorax.  Chest tube was placed without difficulty, follow-up x-ray with near resolution.  Will obtain CT head and neck along with CT chest to evaluate for any potential traumatic injuries.  Labs as above--no leukocytosis.  Some minor electrolyte abnormalities-- Na+ 128, K+ 3.0.  Ethanol 301.  CT head/neck negative for  acute findings.  CT chest with isolated right 5th lateral rib fracture, other underlying chronic rib fractures.   Patient is chronic alcoholic, placed on CIWA protocol.  Will discuss with trauma surgery.  Discussed with on call trauma, Dr. Bobbye Morton-- will admit for ongoing care.  Final Clinical Impression(s) / ED Diagnoses Final diagnoses:  Traumatic pneumothorax, initial encounter  Closed fracture of one rib of right side, initial encounter  Alcoholism [facility])    Rx / [place] Orders ED Discharge Orders     None         Lavonya Hoerner,  Vilinda Blanks, PA-C 10/06/22 [id number]    Merryl Hacker, MD 10/06/22 0700

## 2022-10-06 NOTE — TOC CAGE-AID Note (Signed)
Transition of Care [facility]) - CAGE-AID Screening   Patient Details  Name: CATCHER HOERIG MRN: [id number] Date of Birth: [birth year]  Transition of Care [facility]) CM/SW Contact:    Army Melia, RN Phone Number:[phone] 10/06/2022, 7:01 AM   Clinical Narrative:  Requesting detox from alcohol, needs TOC consult  CAGE-AID Screening:    Have You Ever Felt You Ought to Cut Down on Your Drinking or Drug Use?: Yes Have People Annoyed You By Critizing Your Drinking Or Drug Use?: Yes Have You Felt Bad Or Guilty About Your Drinking Or Drug Use?: Yes Have You Ever Had a Drink or Used Drugs First Thing In The Morning to Steady Your Nerves or to Get Rid of a Hangover?: Yes CAGE-AID Score: 4  Substance Abuse Education Offered: Yes (TOC consult, pt presently drunk ETOH 301)

## 2022-10-06 NOTE — ED Triage Notes (Signed)
Pt arrived by EMS from store complaining of shortness of breath with walking and also requesting help for detox Pt has ETOH on board

## 2022-10-06 NOTE — ED Notes (Signed)
Pt repositioned in bed. Pt had venti [employer] cup at bedside full of alcohol. RN threw out [employer] cup.

## 2022-10-06 NOTE — Progress Notes (Signed)
Tried multiple times today to place tele box on patient and he refused.

## 2022-10-06 NOTE — Progress Notes (Signed)
Pt admitted to 6N01. Nurse noted pt talking about things off topic to the questions that were being asked during admission. Pt stated he was talking to himself, pt talked about family disputes and presidential elections. Pt able to follow simple commands but becomes irritated with questions that make him uncomfortable.

## 2022-10-06 NOTE — ED Notes (Signed)
Assisted pt in using urinal. Pt resting comfortably in bed.

## 2022-10-06 NOTE — ED Notes (Signed)
ED TO INPATIENT HANDOFF REPORT  ED Nurse Name and Phone #: Tori [phone]  S Name/Age/Gender Anthony [name] 67 y.o. male Room/Bed: TRAAC/TRAAC  Code Status   Code Status: Full Code  Home/SNF/Other Home Patient oriented to: self, place, time, and situation Is this baseline? Yes   Triage Complete: Triage complete  Chief Complaint Traumatic pneumothorax, initial encounter [S27.0XXA]  Triage Note Pt arrived by EMS from store complaining of shortness of breath with walking and also requesting help for detox Pt has ETOH on board      Allergies Allergies  Allergen Reactions   Other Itching and Other (See Comments)    Seasonal allergies- Itchy eyes, runny nose, congestion   Poison Ivy Extract Rash    Level of Care/Admitting Diagnosis ED Disposition     ED Disposition  Admit   Condition  --   [place]: [facility] [[id number]]  Level of Care: Med-Surg [16]  May admit patient to [facility] or [facility] if equivalent level of care is available:: No  Covid Evaluation: Asymptomatic - no recent exposure (last 10 days) testing not required  Diagnosis: Traumatic pneumothorax, initial encounter [id number]  Admitting Physician: TRAUMA MD [[id number]  Attending Physician: TRAUMA MD [id number]  Certification:: I certify this patient will need inpatient services for at least 2 midnights  Estimated Length of Stay: 9          B Medical/Surgery History Past Medical History:  Diagnosis Date   Actinic keratosis 11/27/2016   Alcohol abuse with alcohol-induced mood disorder ([place]) 07/18/2018   Alcohol use disorder, severe, dependence ([place]) 09/16/2006   05/22/2015 Argumentative, belligerent and verbally abusive to staff per his note from [facility])    Anxiety state 04/25/2018   Aortic atherosclerosis ([place]) 08/27/2022   Chronic low back pain 09/16/2006   Followed by NS.  Per his chart from [place], he fell off two storyhouse roof while  cleaning his brother's gutters and sustained compression fracture of L3, L4 and L5 and fracture of right transverse process at L3 and nondisplaced fracture of left glenoid rim many years ago. He had multiple imaging in [place] including Lumbar MRI in 2016 which showed moderate to severe spinal canal stenos   Delirium tremens ([place]) 09/01/2017   Depression    Dry eye 11/23/2016   Foreign body in middle portion of esophagus 05/19/2022   Hiatal hernia 09/14/2016   Status Collis gastroplasty and Nissen's fundoplication on [id number] in [place]   History of delirium tremons 07/22/2020   DTs during 10/2016 08/2017. And 12/2017 admissions    History of pulmonary embolus (PE) 11/02/2016   Unprovoked 11/01/2016. Xarelto from 11/01/16 to 09/08/2017.   Hydrocele    Surgically corrected   Hypertension    Hypoalbuminemia due to protein-calorie malnutrition ([place]) 07/22/2020   Lumbar compression fracture ([facility])    Multiple rib fractures 07/22/2019   Left rib details XR: left ribs demonstrate multiple remote rib   Pancytopenia ([place])    Rhabdomyolysis 07/22/2020   Skin cancer    exciced 2017   Tinea versicolor 06/08/2013   Tobacco use disorder 07/22/2020   Tooth infection 04/25/2018   Trigger finger, acquired 11/18/2012   Ulnar tunnel syndrome of right wrist 11/08/2012   Past Surgical History:  Procedure Laterality Date   BIOPSY  05/21/2022   Procedure: BIOPSY;  Surgeon: [name], MD;  Location: [facility] ENDOSCOPY;  Service: Gastroenterology;;   CATARACT EXTRACTION     ESOPHAGOGASTRODUODENOSCOPY (EGD) WITH PROPOFOL N/A 05/21/2022  Procedure: ESOPHAGOGASTRODUODENOSCOPY (EGD) WITH PROPOFOL;  Surgeon: [name], MD;  Location: [facility];  Service: Gastroenterology;  Laterality: N/A;   FOREIGN BODY REMOVAL N/A 05/21/2022   Procedure: FOREIGN BODY REMOVAL;  Surgeon: [name], MD;  Location: [facility];  Service: Gastroenterology;  Laterality: N/A;   HIATAL HERNIA REPAIR  2016    HYDROCELE EXCISION / REPAIR  2010   SKIN CANCER EXCISION Left    Excised 2017     A IV Location/Drains/Wounds Patient Lines/Drains/Airways Status     Active Line/Drains/Airways     Name Placement date Placement time Site Days   Peripheral IV 10/06/22 20 G Posterior;Right Forearm 10/06/22  0332  Forearm  less than 1   Chest Tube 1 Lateral;Right Pleural 14 Fr. 10/06/22  0414  Pleural  less than 1   Pressure Injury 08/14/20 Coccyx Medial Stage 1 -  Intact skin with non-blanchable redness of a localized area usually over a bony prominence. 08/14/20  1530  -- 783   Wound / Incision (Open or Dehisced) 03/16/22 Non-pressure wound Toe (Comment  which one) Anterior;Left 03/16/22  1430  Toe (Comment  which one)  204            Intake/Output Last 24 hours No intake or output data in the 24 hours ending 10/06/22 0834  Labs/Imaging Results for orders placed or performed during the hospital encounter of 10/06/22 (from the past 48 hour(s))  CBC with Differential     Status: Abnormal   Collection Time: 10/06/22  2:16 AM  Result Value Ref Range   WBC 5.6 4.0 - 10.5 K/uL   RBC 2.99 (L) 4.22 - 5.81 MIL/uL   Hemoglobin 9.9 (L) 13.0 - 17.0 g/dL   HCT 30.7 (L) 39.0 - 52.0 %   MCV 102.7 (H) 80.0 - 100.0 fL   MCH 33.1 26.0 - 34.0 pg   MCHC 32.2 30.0 - 36.0 g/dL   RDW 15.1 11.5 - 15.5 %   Platelets 292 150 - 400 K/uL   nRBC 0.0 0.0 - 0.2 %   Neutrophils Relative % 48 %   Neutro Abs 2.7 1.7 - 7.7 K/uL   Lymphocytes Relative 42 %   Lymphs Abs 2.3 0.7 - 4.0 K/uL   Monocytes Relative 7 %   Monocytes Absolute 0.4 0.1 - 1.0 K/uL   Eosinophils Relative 2 %   Eosinophils Absolute 0.1 0.0 - 0.5 K/uL   Basophils Relative 1 %   Basophils Absolute 0.0 0.0 - 0.1 K/uL   Immature Granulocytes 0 %   Abs Immature Granulocytes 0.01 0.00 - 0.07 K/uL    Comment: Performed at [facility], [address], [place], [place] [address]  Comprehensive metabolic panel     Status: Abnormal   Collection Time:  10/06/22  2:16 AM  Result Value Ref Range   Sodium 128 (L) 135 - 145 mmol/L   Potassium 3.0 (L) 3.5 - 5.1 mmol/L   Chloride 97 (L) 98 - 111 mmol/L   CO2 13 (L) 22 - 32 mmol/L   Glucose, Bld 147 (H) 70 - 99 mg/dL    Comment: Glucose reference range applies only to samples taken after fasting for at least 8 hours.   BUN 11 8 - 23 mg/dL   Creatinine, Ser 0.88 0.61 - 1.24 mg/dL   Calcium 8.4 (L) 8.9 - 10.3 mg/dL   Total Protein 6.8 6.5 - 8.1 g/dL   Albumin 3.0 (L) 3.5 - 5.0 g/dL   AST 34 15 - 41 U/L  ALT 17 0 - 44 U/L   Alkaline Phosphatase 76 38 - 126 U/L   Total Bilirubin 0.6 0.3 - 1.2 mg/dL   GFR, Estimated >60 >60 mL/min    Comment: (NOTE) Calculated using the CKD-EPI Creatinine Equation (2021)    Anion gap 18 (H) 5 - 15    Comment: Performed at [facility] [address] [place], [place] [address]  Troponin I (High Sensitivity)     Status: None   Collection Time: 10/06/22  2:16 AM  Result Value Ref Range   Troponin I (High Sensitivity) 11 <18 ng/L    Comment: (NOTE) Elevated high sensitivity troponin I (hsTnI) values and significant  changes across serial measurements may suggest ACS but many other  chronic and acute conditions are known to elevate hsTnI results.  Refer to the "Links" section for chest pain algorithms and additional  guidance. Performed at [facility], [place] [address], [place], [place] [address]   Brain natriuretic peptide     Status: Abnormal   Collection Time: 10/06/22  2:16 AM  Result Value Ref Range   B Natriuretic Peptide 273.4 (H) 0.0 - 100.0 pg/mL    Comment: Performed at [facility] [address], [place], [place] [address]  Ethanol     Status: Abnormal   Collection Time: 10/06/22  2:16 AM  Result Value Ref Range   Alcohol, Ethyl (B) 301 (HH) <10 mg/dL    Comment: CRITICAL RESULT CALLED TO, READ BACK BY AND VERIFIED WITH [place] PARAMEDIC 10/06/22 0316 M KOROLESKI (NOTE) Lowest detectable limit for serum alcohol is  10 mg/dL.  For medical purposes only. Performed at [facility], [place] [address], [place], [place] [address]   Troponin I (High Sensitivity)     Status: None   Collection Time: 10/06/22  3:37 AM  Result Value Ref Range   Troponin I (High Sensitivity) 11 <18 ng/L    Comment: (NOTE) Elevated high sensitivity troponin I (hsTnI) values and significant  changes across serial measurements may suggest ACS but many other  chronic and acute conditions are known to elevate hsTnI results.  Refer to the "Links" section for chest pain algorithms and additional  guidance. Performed at [facility], [place] [address] [place], [place] [address]   Rapid urine drug screen (hospital performed)     Status: Abnormal   Collection Time: 10/06/22  6:15 AM  Result Value Ref Range   Opiates NONE DETECTED NONE DETECTED   Cocaine NONE DETECTED NONE DETECTED   Benzodiazepines POSITIVE (A) NONE DETECTED   Amphetamines NONE DETECTED NONE DETECTED   Tetrahydrocannabinol NONE DETECTED NONE DETECTED   Barbiturates NONE DETECTED NONE DETECTED    Comment: (NOTE) DRUG SCREEN FOR MEDICAL PURPOSES ONLY.  IF CONFIRMATION IS NEEDED FOR ANY PURPOSE, NOTIFY LAB WITHIN 5 DAYS.  LOWEST DETECTABLE LIMITS FOR URINE DRUG SCREEN Drug Class                     Cutoff (ng/mL) Amphetamine and metabolites    1000 Barbiturate and metabolites    200 Benzodiazepine                 200 Opiates and metabolites        300 Cocaine and metabolites        300 THC  50 Performed at [facility], [place] [address], [place], [place] [address]    CT Chest W Contrast  Result Date: 10/06/2022 CLINICAL DATA:  67 year old male with shortness of breath. Requesting detox. Blunt trauma. Fall on Eliquis. Right pneumothorax status post pigtail chest tube placement. EXAM: CT CHEST WITH CONTRAST TECHNIQUE: Multidetector CT imaging of the chest was performed during intravenous contrast administration.  RADIATION DOSE REDUCTION: This exam was performed according to the departmental dose-optimization program which includes automated exposure control, adjustment of the mA and/or kV according to patient size and/or use of iterative reconstruction technique. CONTRAST:  28mL OMNIPAQUE IOHEXOL 350 MG/ML SOLN COMPARISON:  Cervical spine CT today. Recent chest CTA 09/20/2022, and portable chest x-ray 0420 hours today. CT Abdomen and Pelvis 05/20/2022. FINDINGS: Cardiovascular: Chronic calcified coronary artery atherosclerosis and/or stents. Cardiac size is stable and within normal limits. Negative thoracic aorta aside from some atherosclerosis. Central pulmonary arteries appear to remain patent. No pericardial effusion. Mediastinum/Nodes: Stable.  No mediastinal mass or lymphadenopathy. Lungs/Pleura: Pigtail right chest tube enters the right anterior pleural space via the 5/6 intercostal space. The medial confluent and platelike right lung atelectasis and small volume residual pneumothorax at the apex. Layering fluid or secretions in the right bronchus intermedius series 4, image 80) but otherwise the major airways are patent. No pleural effusion. Mild dependent atelectasis in both lungs. Mild chronic left lower lobe scarring. No other acute pulmonary finding. Upper Abdomen: No free air or free fluid in the visible abdomen. Mostly noncontrast liver, gallbladder, spleen, pancreas, adrenal glands and kidneys appear stable and negative. Chronic postoperative changes to the gastroesophageal junction and the gastrosplenic ligament. No dilated bowel in the upper abdomen Musculoskeletal: Chronic rib fractures. Osteopenia. Acute nondisplaced right lateral 5th rib fracture on series 4, image 58 is new since 09/20/2022. No acute displaced rib fracture. Small volume pneumothorax and/or chest tube related right chest wall subcutaneous gas. Intact sternum and visible shoulder osseous structures. Stable thoracic vertebrae including mild  T2 it and T3 superior endplate compression. Chronic L2 through L4 compression fractures appear stable from the CT Abdomen and Pelvis last year. IMPRESSION: 1. Acute nondisplaced right lateral 5th rib fracture. Underlying chronic rib fractures. Chronic compression fractures. 2. Right chest tube in place with small volume residual right pneumothorax. No pleural effusion. Small volume aspirated or retained secretions in the right bronchus intermedius. Atelectasis but no convincing pneumonia. 3. No other acute traumatic injury identified in the chest. 4. Chronic calcified coronary artery and Aortic Atherosclerosis (ICD10-I70.0). Electronically Signed   By: Genevie Ann M.D.   On: 10/06/2022 06:08   CT Cervical Spine Wo Contrast  Result Date: 10/06/2022 CLINICAL DATA:  67 year old male with shortness of breath. Requesting detox. Blunt trauma. Fall on Eliquis. EXAM: CT CERVICAL SPINE WITHOUT CONTRAST TECHNIQUE: Multidetector CT imaging of the cervical spine was performed without intravenous contrast. Multiplanar CT image reconstructions were also generated. RADIATION DOSE REDUCTION: This exam was performed according to the departmental dose-optimization program which includes automated exposure control, adjustment of the mA and/or kV according to patient size and/or use of iterative reconstruction technique. COMPARISON:  Head and chest CT today reported separately. Prior cervical spine CT 09/20/2022. FINDINGS: Alignment: Stable. Straightening of cervical lordosis with up to the 4 mm of degenerative appearing anterolisthesis of C3 on C4, subtle retrolisthesis of C4 on C5. Cervicothoracic junction alignment is within normal limits. Bilateral posterior element alignment is within normal limits. Skull base and vertebrae: Visualized skull base is intact. No atlanto-occipital dissociation. C1  and C2 appears stable and intact. No acute osseous abnormality identified. Soft tissues and spinal canal: No prevertebral fluid or  swelling. No visible canal hematoma. Calcified carotid atherosclerosis in the neck. Otherwise negative visible noncontrast neck soft tissues. Disc levels: Advanced upper cervical facet arthropathy C2 through C4 in the setting of chronic spondylolisthesis. Advanced disc and endplate degeneration throughout the cervical spine. Multilevel mild cervical spinal stenosis suspected, chronic. Upper chest: There is a small right apical pneumothorax which is new from earlier this month. See chest CT reported separately. IMPRESSION: 1. Small right apical pneumothorax, see Chest CT reported separately. 2. No acute traumatic injury identified in the cervical spine. 3. Stable advanced cervical spine degeneration, including chronic spondylolisthesis at C3-C4. Electronically Signed   By: Genevie Ann M.D.   On: 10/06/2022 05:58   CT HEAD WO CONTRAST (5MM)  Result Date: 10/06/2022 CLINICAL DATA:  67 year old male with shortness of breath. Requesting detox. Blunt trauma. Fall on Eliquis. EXAM: CT HEAD WITHOUT CONTRAST TECHNIQUE: Contiguous axial images were obtained from the base of the skull through the vertex without intravenous contrast. RADIATION DOSE REDUCTION: This exam was performed according to the departmental dose-optimization program which includes automated exposure control, adjustment of the mA and/or kV according to patient size and/or use of iterative reconstruction technique. COMPARISON:  Head CT 07/19/2022.  Brain MRI 08/16/2020. FINDINGS: Brain: No midline shift, ventriculomegaly, mass effect, evidence of mass lesion, intracranial hemorrhage or evidence of cortically based acute infarction. Suspect some generalized cerebral volume loss for age, not significantly changed from last year. And possible disproportionate atrophy of the cerebellar vermis. But gray-white differentiation is stable and within normal limits for age. Vascular: Calcified atherosclerosis at the skull base. No suspicious intracranial vascular  hyperdensity. Skull: No acute osseous abnormality identified. Cervical spine detailed separately. Sinuses/Orbits: Resolved sinus fluid seen last year. Visualized paranasal sinuses and mastoids are well aerated. Other: No acute orbit or scalp soft tissue injury identified. IMPRESSION: 1. No acute intracranial abnormality or acute traumatic injury identified. 2. Cervical spine CT detailed separately. Electronically Signed   By: Genevie Ann M.D.   On: 10/06/2022 05:55   DG Chest Portable 1 View  Result Date: 10/06/2022 CLINICAL DATA:  67 year old male status post chest tube placement. EXAM: PORTABLE CHEST 1 VIEW COMPARISON:  Chest x-ray 10/06/2022. FINDINGS: Interval placement of a small bore right-sided chest tube with the tip reformed over the lower right hemithorax. Previously noted right-sided pneumothorax has substantially decreased, now small occupying approximately 5% of the volume of the right hemithorax. There is subcutaneous emphysema in the right chest wall laterally. Lungs appear clear bilaterally. No left pneumothorax. No pleural effusions. No evidence of pulmonary edema. Heart size is normal. Upper mediastinal contours are within normal limits. Mild elevation of the right hemidiaphragm. IMPRESSION: 1. Interval placement of right-sided chest tube with near complete resolution of the previously noted right-sided pneumothorax, as above. Electronically Signed   By: Vinnie Langton M.D.   On: 10/06/2022 05:11   DG Chest 2 View  Result Date: 10/06/2022 CLINICAL DATA:  Dyspnea EXAM: CHEST - 2 VIEW COMPARISON:  09/20/2022 FINDINGS: Large right pneumothorax has developed collapse of the right lung. Minimal subcutaneous gas within the right chest wall. No definite radiographic evidence of tension physiology. Left lung is clear. No pneumothorax on the left. No pleural effusion. Cardiac size within normal limits. Pulmonary vascularity is normal. IMPRESSION: 1. Large right pneumothorax. No definite radiographic  evidence of tension physiology. Electronically Signed   By: Linwood Dibbles.D.  On: 10/06/2022 02:43    Pending Labs Unresulted Labs (From admission, onward)    None       Vitals/Pain Today's Vitals   10/06/22 0630 10/06/22 0634 10/06/22 0745 10/06/22 0754  BP: 112/78 112/78 119/88   Pulse: 88 88 77   Resp: 14  16   Temp:    98.2 F (36.8 C)  TempSrc:    Oral  SpO2: 98%  93%   Weight:      Height:      PainSc:        Isolation Precautions No active isolations  Medications Medications  nicotine (NICODERM CQ - dosed in mg/24 hours) patch 21 mg (21 mg Transdermal Patch Applied 10/06/22 0500)  LORazepam (ATIVAN) injection 0-4 mg (0 mg Intravenous Hold 10/06/22 0559)    Or  LORazepam (ATIVAN) tablet 0-4 mg ( Oral See Alternative 10/06/22 0559)  LORazepam (ATIVAN) injection 0-4 mg (has no administration in time range)    Or  LORazepam (ATIVAN) tablet 0-4 mg (has no administration in time range)  thiamine (VITAMIN B1) tablet 100 mg (has no administration in time range)    Or  thiamine (VITAMIN B1) injection 100 mg (has no administration in time range)  acetaminophen (TYLENOL) tablet 1,000 mg (has no administration in time range)  oxyCODONE (Oxy IR/ROXICODONE) immediate release tablet 2.5-5 mg (has no administration in time range)  morphine (PF) 2 MG/ML injection 1-2 mg (has no administration in time range)  docusate sodium (COLACE) capsule 100 mg (has no administration in time range)  ondansetron (ZOFRAN-ODT) disintegrating tablet 4 mg (has no administration in time range)    Or  ondansetron (ZOFRAN) injection 4 mg (has no administration in time range)  enoxaparin (LOVENOX) injection 30 mg (has no administration in time range)  spiritus frumenti (ethyl alcohol) solution 1 each (has no administration in time range)  folic acid (FOLVITE) tablet 1 mg (has no administration in time range)  ketamine 50 mg in normal saline 5 mL (10 mg/mL) syringe (20 mg Intravenous Given 10/06/22  0358)  lidocaine-EPINEPHrine (XYLOCAINE W/EPI) 2 %-1:200000 (PF) injection 20 mL (20 mLs Infiltration Given 10/06/22 0358)  fentaNYL (SUBLIMAZE) injection 50 mcg (50 mcg Intravenous Given 10/06/22 0501)  iohexol (OMNIPAQUE) 350 MG/ML injection 75 mL (75 mLs Intravenous Contrast Given 10/06/22 0542)  LORazepam (ATIVAN) injection 1 mg (1 mg Intravenous Given 10/06/22 0602)    Mobility walks     Focused Assessments Cardiac Assessment Handoff:    Lab Results  Component Value Date   CKTOTAL 829 (H) 07/22/2020   TROPONINI 0.03 (HH) 11/02/2016   Lab Results  Component Value Date   DDIMER 1.82 (H) 08/27/2022   Does the Patient currently have chest pain? No    R Recommendations: See Admitting Provider Note  Report given to:   Additional Notes: Pt is axox4 but will get confused at times. VSS. Chest tube on  right side.

## 2022-10-06 NOTE — Progress Notes (Signed)
Pt refused Pulse ox and tele monitoring. Nurse educated patient on importance of having pulse ox while having a chest tube and a tele monitor considering cardiac history. Pt refused.

## 2022-10-06 NOTE — Progress Notes (Signed)
PT called me into the room because patient's chest tube had come out a Weekes bit. The nurse for the patient, Loma Sousa, and I assessed the site. Originally the site only had a bandaid covering it. The tube had come out 1cm. We applied gauze and taped the tube in place. The chest tube seems to be functioning appropriately. I notified Dr. Bobbye Morton via secure chat about the chest tube situation. Patient breathing unlabored and no SOB. Resting comfortably-no distress.

## 2022-10-07 ENCOUNTER — Inpatient Hospital Stay (HOSPITAL_COMMUNITY): Payer: 59

## 2022-10-07 LAB — CBC
HCT: 30.6 % — ABNORMAL LOW (ref 39.0–52.0)
Hemoglobin: 10.5 g/dL — ABNORMAL LOW (ref 13.0–17.0)
MCH: 33.9 pg (ref 26.0–34.0)
MCHC: 34.3 g/dL (ref 30.0–36.0)
MCV: 98.7 fL (ref 80.0–100.0)
Platelets: 264 10*3/uL (ref 150–400)
RBC: 3.1 MIL/uL — ABNORMAL LOW (ref 4.22–5.81)
RDW: 14.9 % (ref 11.5–15.5)
WBC: 7.3 10*3/uL (ref 4.0–10.5)
nRBC: 0 % (ref 0.0–0.2)

## 2022-10-07 LAB — BASIC METABOLIC PANEL
Anion gap: 11 (ref 5–15)
BUN: 7 mg/dL — ABNORMAL LOW (ref 8–23)
CO2: 20 mmol/L — ABNORMAL LOW (ref 22–32)
Calcium: 8.1 mg/dL — ABNORMAL LOW (ref 8.9–10.3)
Chloride: 100 mmol/L (ref 98–111)
Creatinine, Ser: 0.6 mg/dL — ABNORMAL LOW (ref 0.61–1.24)
GFR, Estimated: >60 mL/min (ref >60)
Glucose, Bld: 91 mg/dL (ref 70–99)
Potassium: 3.6 mmol/L (ref 3.5–5.1)
Sodium: 131 mmol/L — ABNORMAL LOW (ref 135–145)

## 2022-10-07 MED ORDER — SODIUM CHLORIDE 0.9 % IV SOLN: 250.0000 mL | INTRAVENOUS | Status: DC

## 2022-10-07 MED ORDER — SODIUM CHLORIDE 0.9 % IV BOLUS
500.0000 mL | Freq: Once | INTRAVENOUS | Status: AC
  Administered 2022-10-07: 500 mL via INTRAVENOUS

## 2022-10-07 MED ORDER — CHLORHEXIDINE GLUCONATE CLOTH 2 % EX PADS
6.0000 | MEDICATED_PAD | Freq: Every day | CUTANEOUS | Status: DC
  Administered 2022-10-07 – 2022-10-08 (×2): 6 via TOPICAL

## 2022-10-07 MED ORDER — NOREPINEPHRINE 4 MG/250ML-% IV SOLN
INTRAVENOUS | Status: AC
  Filled 2022-10-07: qty 250

## 2022-10-07 MED ORDER — PHENOBARBITAL SODIUM 130 MG/ML IJ SOLN
97.5000 mg | Freq: Three times a day (TID) | INTRAMUSCULAR | Status: DC
  Administered 2022-10-07 – 2022-10-08 (×3): 97.5 mg via INTRAVENOUS
  Filled 2022-10-07 (×3): qty 1

## 2022-10-07 MED ORDER — SODIUM CHLORIDE 0.9 % IV SOLN: INTRAVENOUS | Status: DC

## 2022-10-07 MED ORDER — LORAZEPAM 2 MG/ML IJ SOLN: 1.0000 mg | INTRAMUSCULAR | Status: DC | PRN

## 2022-10-07 MED ORDER — NOREPINEPHRINE 4 MG/250ML-% IV SOLN
2.0000 ug/min | INTRAVENOUS | Status: DC
  Administered 2022-10-07: 2 ug/min via INTRAVENOUS

## 2022-10-07 MED ORDER — METOPROLOL TARTRATE 5 MG/5ML IV SOLN
5.0000 mg | Freq: Three times a day (TID) | INTRAVENOUS | Status: DC | PRN
  Administered 2022-10-07: 5 mg via INTRAVENOUS
  Filled 2022-10-07: qty 5

## 2022-10-07 MED ORDER — PHENOBARBITAL SODIUM 65 MG/ML IJ SOLN: 32.5000 mg | Freq: Three times a day (TID) | INTRAMUSCULAR | Status: DC

## 2022-10-07 MED ORDER — IBUPROFEN 800 MG PO TABS
800.0000 mg | ORAL_TABLET | Freq: Once | ORAL | Status: AC
  Administered 2022-10-07: 800 mg via ORAL
  Filled 2022-10-07: qty 1

## 2022-10-07 MED ORDER — PHENOBARBITAL SODIUM 65 MG/ML IJ SOLN: 65.0000 mg | Freq: Three times a day (TID) | INTRAMUSCULAR | Status: DC

## 2022-10-07 NOTE — Procedures (Signed)
   Procedure Note  Date: 10/07/2022  Procedure: central venous catheter placement--right, subclavian vein, without ultrasound guidance  Pre-op diagnosis: hypotension, need for administration of vasopressors Post-op diagnosis: same  Surgeon: Jesusita Oka, MD  Anesthesia: local  EBL: <5cc Drains/Implants: triple lumen central venous catheter  Description of procedure: Time-out was performed verifying correct patient, procedure, site, laterality. The patient was too somnolent to provide consent. The right upper chest was prepped and draped in the usual sterile fashion. Five ccs of local anesthetic was infiltrated at the site of venous access. The right subclavian vein was accessed using an introducer needle and a guidewire passed through the needle. The needle was removed and a skin nick was made. The tract was dilated and the central venous catheter advanced over the guidewire followed by removal of the guidewire. All ports drew blood easily and all were flushed with saline. The catheter was secured to the skin with suture and a sterile dressing. The patient tolerated the procedure well. There were no immediate complications. Follow up chest x-ray was ordered to confirm positioning and the absence of a pneumothorax.  Procedure Note  Date: 10/07/2022  Procedure: arterial line placement--right, radial artery, with ultrasound guidance  Pre-op diagnosis: hypotension, need for invasive blood pressure monitoring Post-op diagnosis: same  Surgeon: Jesusita Oka, MD  Anesthesia: none  EBL: <5cc Drains/Implants: 4 cm arterial catheter  Description of procedure: Time-out was performed verifying correct patient, procedure, site, laterality. Patient was too somnolent to provide consent. The right wrist was prepped and draped in the usual sterile fashion. The artery was localized with ultrasound guidance and accessed using an arterial catheterization kit after 3 attempt(s). The wire was  advanced and the sheath advanced over the wire. The needle and wire were removed with pulsatile, bright red blood noted through the catheter. The catheter was connected to a transducer and a good waveform was noted. The catheter was secured in place with suture and tegaderm. The patient tolerated the procedure well. There were no complications.    Jesusita Oka, MD General and [facility]

## 2022-10-07 NOTE — Progress Notes (Signed)
PT Cancellation Note  Patient Details Name: Anthony [name] MRN: [id number] DOB: October 19, 1955   Cancelled Treatment:    Reason Eval/Treat Not Completed: Patient not medically ready Pt noted to have elevated HR & RR, low BP (per chart) with red mews. Will hold PT tx at this time.  Anthony [name], PT, DPT 10/07/22, 12:17 PM   Anthony [name] 10/07/2022, 12:16 PM

## 2022-10-07 NOTE — TOC Initial Note (Signed)
Transition of Care [facility]) - Initial/Assessment Note    Patient Details  Name: Anthony [name] MRN: [id number] Date of Birth: 1956-02-12  Transition of [facility]) CM/SW Contact:    Ella Bodo, RN Phone Number: 10/07/2022, 10:10am  Clinical Narrative:                 67 y.o. male presents to [facility] hospital on 10/06/2022 after syncopal episode and fall. Imaging shows R 5th rib fx and PTX. PT also requesting help for detox.  PTA, pt homeless and living here in [place].  Met with him this morning with TRN. MD Considering transferring patient to ICU for precedex, as appears to be in acute alcohol withdrawal.  Will follow progress and assist with disposition as able.     Expected Discharge Plan: Homeless Shelter Barriers to Discharge: Continued Medical Work up            Expected Discharge Plan and Services   Discharge Planning Services: CM Consult   Living arrangements for the past 2 months: [place], Homeless                                      Prior Living Arrangements/Services Living arrangements for the past 2 months: Homeless Shelter, Homeless Lives with:: Self Patient language and need for interpreter reviewed:: Yes          Care giver support system in place?: No (comment) Current home services: DME Criminal Activity/Legal Involvement Pertinent to Current Situation/Hospitalization: No - Comment as needed               Emotional Assessment Appearance:: Appears stated age Attitude/Demeanor/Rapport: Engaged Affect (typically observed): Pleasant Orientation: : Oriented to Self, Oriented to Place, Oriented to Situation      Admission diagnosis:  Alcoholism ([place]) [F10.20] Traumatic pneumothorax, initial encounter [S27.0XXA] Closed fracture of one rib of right side, initial encounter [S22.31XA] Patient Active Problem List   Diagnosis Date Noted   Traumatic pneumothorax, initial encounter 10/06/2022   SIRS (systemic inflammatory response  syndrome) ([place]) 09/21/2022   Homelessness 09/21/2022   Atrial fibrillation ([place]) 09/12/2022   Moderate protein malnutrition ([place]) 09/11/2022   Chronic diarrhea 09/11/2022   C. difficile colitis 09/11/2022   Aspiration pneumonia ([place]) 08/27/2022   Aortic atherosclerosis ([place]) 08/27/2022   Depression, unspecified 08/09/2022   Hyponatremia 06/01/2022   Non compliance w medication regimen 05/31/2022   Delirium 05/25/2022   Stricture and stenosis of esophagus 05/21/2022   Gastritis and gastroduodenitis 05/21/2022   Loose stools 05/20/2022   Malnutrition of moderate degree 05/20/2022   Acute on chronic congestive heart failure ([place])    Leukocytosis 05/19/2022   Sepsis ([place]) 05/19/2022   Cellulitis of left leg, possible 05/19/2022    Class: Question of   Moniliasis, interdigital 05/19/2022   Open toe wound, right foot, medial fourth digit 05/19/2022   Malnutrition ([place]) 05/19/2022   Alcohol abuse 03/24/2022   Dyslipidemia 03/24/2022   Acute on chronic diastolic CHF (congestive heart failure) ([place]) 03/24/2022   Acute respiratory failure with hypoxia (HCC) 03/23/2022   Generalized weakness    Atrial fibrillation with RVR ([place]) 03/15/2022   Hypokalemia 03/15/2022   Iron deficiency anemia 10/16/2020   Cough    Hypomagnesemia    Protein-calorie malnutrition, severe 08/22/2020   Alcohol use disorder, severe, in controlled environment ([place])    Pressure injury of skin 08/18/2020   Urinary tract infection without hematuria  Severe sepsis ([place]) 08/14/2020   Paroxysmal atrial fibrillation ([place]) 08/13/2020   Rhabdomyolysis 07/22/2020   Cerebral ventriculomegaly 07/22/2020   Hemoglobin decreased 07/22/2020   History of delirium tremons 07/22/2020   Hypoalbuminemia due to protein-calorie malnutrition ([place]) 07/22/2020   Tobacco use disorder 07/22/2020   Alcohol withdrawal delirium ([place])    Dehydration    Alcohol abuse with alcohol-induced mood disorder ([place]) 07/18/2018   Depression  05/26/2018   Anxiety state 04/25/2018   Need for immunization against influenza 04/25/2018   Alcohol withdrawal ([place]) 09/01/2017   DOE (dyspnea on exertion) 11/27/2016   Actinic keratosis 11/27/2016   Macrocytic anemia 11/23/2016   History of pulmonary embolus (PE) 11/02/2016   Hiatal hernia 09/14/2016   Skin cancer 08/13/2016   Poor social situation 06/09/2013   Tinea versicolor 06/08/2013   Back pain 05/07/2013   Seborrheic dermatitis of scalp 11/08/2012   HTN (hypertension) 04/11/2012   Alcohol use disorder, severe, dependence ([place]) 09/16/2006   PCP:  Pcp, No Pharmacy:   [facility] [place] [address] [place] [address] Phone: [phone] Fax: [phone]     Social Determinants of Health (SDOH) Social History: SDOH Screenings   Food Insecurity: Food Insecurity Present (09/11/2022)  Housing: High Risk (09/11/2022)  Transportation Needs: Unmet Transportation Needs (09/11/2022)  Utilities: Not At Risk (09/11/2022)  Depression (PHQ2-9): Medium Risk (10/16/2020)  Tobacco Use: High Risk (10/06/2022)   SDOH Interventions:     Readmission Risk Interventions    08/28/2022    2:00 PM 08/27/2022   10:30 AM 08/27/2022   10:08 AM  Readmission Risk Prevention Plan  Transportation Screening Complete Complete   Medication Review ([place])  Complete   PCP or Specialist appointment within 3-5 days of discharge Complete Complete Complete  HRI or Home Care Consult  Complete Complete  SW Recovery Care/Counseling Consult Complete Complete Complete  Palliative Care Screening Not Applicable Not Applicable Complete  [place] Not Applicable Not Applicable Not Applicable   Reinaldo Raddle, RN, BSN  Trauma/Neuro ICU Case Manager [phone]

## 2022-10-07 NOTE — Progress Notes (Signed)
Patient ID: Anthony [name], male   DOB: 04/22/1956, 67 y.o.   MRN: [id number] Line in good position, ready for use

## 2022-10-07 NOTE — Progress Notes (Signed)
OT Cancellation Note  Patient Details Name: Anthony [name] MRN: [id number] DOB: 1955-08-27   Cancelled Treatment:    Reason Eval/Treat Not Completed: Medical issues which prohibited therapy. Per chart, noted to have elevated HR & RR, low BP, red MEWs. Moved to ICU. Will follow up as medically appropriate.   Elder Cyphers, OTR/L [facility] Office: [phone]   Magnus Ivan 10/07/2022, 12:38 PM

## 2022-10-07 NOTE — Progress Notes (Signed)
Trauma Event Note  Rounding on pt- hx of ETOH abuse with withdrawal- received Ativan 1mg  at 8am. Pt is currently awake/alert/oriented x 4- able to answer all questions. Is cooperative with assessment.  States he prefers to drink 80 proof liquor instead of the beer he was given-- when asked if he is hearing or seeing anything that I don't, he said no, but also said he has in the past.   Pt is homeless- no family or friends local. States he sleeps wherever he can, and drinks everyday all day if he can get alcohol.   Hands are hot, axillary temp was 100.1-- taken axillary due to drinking pepsi with ice in it.  Received tylenol this morning.   Repeat CIWA - 3.       Last imported Vital Signs BP 119/76 (BP Location: Left Arm)   Pulse 96   Temp (!) 101.5 F (38.6 C) (Oral)   Resp (!) 26   Ht 5\' 8"  (1.727 m)   Wt 150 lb (68 kg)   SpO2 97%   BMI 22.81 kg/m   Trending CBC Recent Labs    10/06/22 0216  WBC 5.6  HGB 9.9*  HCT 30.7*  PLT 292    Trending Coag's No results for input(s): "APTT", "INR" in the last 72 hours.  Trending BMET Recent Labs    10/06/22 0216  NA 128*  K 3.0*  CL 97*  CO2 13*  BUN 11  CREATININE 0.88  GLUCOSE 147*      Leilana Mcquire M Eual Lindstrom  Trauma Response RN  Please call TRN at [phone] for further assistance.

## 2022-10-07 NOTE — Progress Notes (Addendum)
Subjective: CC: Some R rib pain. No other areas of pain. Not requiring any prn pain medications yesterday or today. Tolerating po but only drinking liquids really. No n/v. Doesn't report abdominal pain but couldn't get a clear answer entirely. Voiding without complaints. Loose stool/diarrhea x 1 yesterday. Was reported to have multiple episodes of diarrhea prior to admission. Denies any recent travel, abx use or persons with similar symptoms. No IS in room. Asking RN to get him one.   CT retracted some per RN yesterday. CXR yesterday with stable chest tube placement with minimal right apical ptx. CT looks like stitch is in good position today. CXR pending.   Worked with PT and recommended for no f/u. No sob with ambulation during their session. Ambulated 22ft, min guard, no assistive device. Did not work with OT.   Afebrile. Tachycardia intermittently. No hypotension. No labs done today, pending.   Did not get spiritus frumenti last night or this am. No prn ativan given yesterday. Received 1mg  scheduled dose at 0602 yesterday. Last CIWA 0 at 0600 per RN documentation. Alerted he is now on cardiac monitoring (refused yesterday) and tachy in 110-130. RN has redone CIWA, giving ativan and calling down to get spiritus frumenti.   Objective: Vital signs in last 24 hours: Temp:  [97.4 F (36.3 C)-98.4 F (36.9 C)] 98.4 F (36.9 C) (03/20 0641) Pulse Rate:  [76-117] 99 (03/20 0641) Resp:  [15-17] 16 (03/20 0641) BP: (116-135)/(67-88) 133/67 (03/20 0641) SpO2:  [90 %-100 %] 97 % (03/20 0517) Last BM Date : 10/01/22  Intake/Output from previous day: 03/19 0701 - 03/20 0700 In: 520 [P.O.:520] Out: 350 [Urine:350] Intake/Output this shift: No intake/output data recorded.  PE: Gen:  Alert Card:  Tachycardic Pulm:  CTAB, no W/R/R, effort normal. R chest tube in place with no air leak. 25cc SS output in [place]ara Abd: Soft, ND, NT Ext:  MAE's. Slight hand tremor. No LE edema Psych: A&O  x 3 (self, place, time). Appears anxious/agitated  Lab Results:  Recent Labs    10/06/22 0216  WBC 5.6  HGB 9.9*  HCT 30.7*  PLT 292   BMET Recent Labs    10/06/22 0216  NA 128*  K 3.0*  CL 97*  CO2 13*  GLUCOSE 147*  BUN 11  CREATININE 0.88  CALCIUM 8.4*   PT/INR No results for input(s): "LABPROT", "INR" in the last 72 hours. CMP     Component Value Date/Time   NA 128 (L) 10/06/2022 0216   NA 138 11/27/2016 1509   K 3.0 (L) 10/06/2022 0216   CL 97 (L) 10/06/2022 0216   CO2 13 (L) 10/06/2022 0216   GLUCOSE 147 (H) 10/06/2022 0216   BUN 11 10/06/2022 0216   BUN 9 11/27/2016 1509   CREATININE 0.88 10/06/2022 0216   CREATININE 0.78 08/10/2013 1630   CALCIUM 8.4 (L) 10/06/2022 0216   PROT 6.8 10/06/2022 0216   PROT 6.8 11/27/2016 1509   ALBUMIN 3.0 (L) 10/06/2022 0216   ALBUMIN 4.1 11/27/2016 1509   AST 34 10/06/2022 0216   ALT 17 10/06/2022 0216   ALKPHOS 76 10/06/2022 0216   BILITOT 0.6 10/06/2022 0216   BILITOT 0.3 11/27/2016 1509   GFRNONAA >60 10/06/2022 0216   GFRAA >60 07/20/2018 0524   Lipase     Component Value Date/Time   LIPASE 43 10/01/2022 0352    Studies/Results: DG Chest Port 1 View  Result Date: 10/06/2022 CLINICAL DATA:  Chest tube. EXAM: PORTABLE  CHEST 1 VIEW COMPARISON:  Same day. FINDINGS: Right-sided chest tube is unchanged in position. Minimal right apical pneumothorax is noted. IMPRESSION: Stable position of right-sided chest tube. Minimal right apical pneumothorax. Electronically Signed   By: Marijo Conception M.D.   On: 10/06/2022 12:45   CT Chest W Contrast  Result Date: 10/06/2022 CLINICAL DATA:  67 year old male with shortness of breath. Requesting detox. Blunt trauma. Fall on Eliquis. Right pneumothorax status post pigtail chest tube placement. EXAM: CT CHEST WITH CONTRAST TECHNIQUE: Multidetector CT imaging of the chest was performed during intravenous contrast administration. RADIATION DOSE REDUCTION: This exam was performed  according to the departmental dose-optimization program which includes automated exposure control, adjustment of the mA and/or kV according to patient size and/or use of iterative reconstruction technique. CONTRAST:  26mL OMNIPAQUE IOHEXOL 350 MG/ML SOLN COMPARISON:  Cervical spine CT today. Recent chest CTA 09/20/2022, and portable chest x-ray 0420 hours today. CT Abdomen and Pelvis 05/20/2022. FINDINGS: Cardiovascular: Chronic calcified coronary artery atherosclerosis and/or stents. Cardiac size is stable and within normal limits. Negative thoracic aorta aside from some atherosclerosis. Central pulmonary arteries appear to remain patent. No pericardial effusion. Mediastinum/Nodes: Stable.  No mediastinal mass or lymphadenopathy. Lungs/Pleura: Pigtail right chest tube enters the right anterior pleural space via the 5/6 intercostal space. The medial confluent and platelike right lung atelectasis and small volume residual pneumothorax at the apex. Layering fluid or secretions in the right bronchus intermedius series 4, image 80) but otherwise the major airways are patent. No pleural effusion. Mild dependent atelectasis in both lungs. Mild chronic left lower lobe scarring. No other acute pulmonary finding. Upper Abdomen: No free air or free fluid in the visible abdomen. Mostly noncontrast liver, gallbladder, spleen, pancreas, adrenal glands and kidneys appear stable and negative. Chronic postoperative changes to the gastroesophageal junction and the gastrosplenic ligament. No dilated bowel in the upper abdomen Musculoskeletal: Chronic rib fractures. Osteopenia. Acute nondisplaced right lateral 5th rib fracture on series 4, image 58 is new since 09/20/2022. No acute displaced rib fracture. Small volume pneumothorax and/or chest tube related right chest wall subcutaneous gas. Intact sternum and visible shoulder osseous structures. Stable thoracic vertebrae including mild T2 it and T3 superior endplate compression.  Chronic L2 through L4 compression fractures appear stable from the CT Abdomen and Pelvis last year. IMPRESSION: 1. Acute nondisplaced right lateral 5th rib fracture. Underlying chronic rib fractures. Chronic compression fractures. 2. Right chest tube in place with small volume residual right pneumothorax. No pleural effusion. Small volume aspirated or retained secretions in the right bronchus intermedius. Atelectasis but no convincing pneumonia. 3. No other acute traumatic injury identified in the chest. 4. Chronic calcified coronary artery and Aortic Atherosclerosis (ICD10-I70.0). Electronically Signed   By: Genevie Ann M.D.   On: 10/06/2022 06:08   CT Cervical Spine Wo Contrast  Result Date: 10/06/2022 CLINICAL DATA:  67 year old male with shortness of breath. Requesting detox. Blunt trauma. Fall on Eliquis. EXAM: CT CERVICAL SPINE WITHOUT CONTRAST TECHNIQUE: Multidetector CT imaging of the cervical spine was performed without intravenous contrast. Multiplanar CT image reconstructions were also generated. RADIATION DOSE REDUCTION: This exam was performed according to the departmental dose-optimization program which includes automated exposure control, adjustment of the mA and/or kV according to patient size and/or use of iterative reconstruction technique. COMPARISON:  Head and chest CT today reported separately. Prior cervical spine CT 09/20/2022. FINDINGS: Alignment: Stable. Straightening of cervical lordosis with up to the 4 mm of degenerative appearing anterolisthesis of C3 on C4, subtle retrolisthesis  of C4 on C5. Cervicothoracic junction alignment is within normal limits. Bilateral posterior element alignment is within normal limits. Skull base and vertebrae: Visualized skull base is intact. No atlanto-occipital dissociation. C1 and C2 appears stable and intact. No acute osseous abnormality identified. Soft tissues and spinal canal: No prevertebral fluid or swelling. No visible canal hematoma. Calcified  carotid atherosclerosis in the neck. Otherwise negative visible noncontrast neck soft tissues. Disc levels: Advanced upper cervical facet arthropathy C2 through C4 in the setting of chronic spondylolisthesis. Advanced disc and endplate degeneration throughout the cervical spine. Multilevel mild cervical spinal stenosis suspected, chronic. Upper chest: There is a small right apical pneumothorax which is new from earlier this month. See chest CT reported separately. IMPRESSION: 1. Small right apical pneumothorax, see Chest CT reported separately. 2. No acute traumatic injury identified in the cervical spine. 3. Stable advanced cervical spine degeneration, including chronic spondylolisthesis at C3-C4. Electronically Signed   By: Genevie Ann M.D.   On: 10/06/2022 05:58   CT HEAD WO CONTRAST (5MM)  Result Date: 10/06/2022 CLINICAL DATA:  67 year old male with shortness of breath. Requesting detox. Blunt trauma. Fall on Eliquis. EXAM: CT HEAD WITHOUT CONTRAST TECHNIQUE: Contiguous axial images were obtained from the base of the skull through the vertex without intravenous contrast. RADIATION DOSE REDUCTION: This exam was performed according to the departmental dose-optimization program which includes automated exposure control, adjustment of the mA and/or kV according to patient size and/or use of iterative reconstruction technique. COMPARISON:  Head CT 07/19/2022.  Brain MRI 08/16/2020. FINDINGS: Brain: No midline shift, ventriculomegaly, mass effect, evidence of mass lesion, intracranial hemorrhage or evidence of cortically based acute infarction. Suspect some generalized cerebral volume loss for age, not significantly changed from last year. And possible disproportionate atrophy of the cerebellar vermis. But gray-white differentiation is stable and within normal limits for age. Vascular: Calcified atherosclerosis at the skull base. No suspicious intracranial vascular hyperdensity. Skull: No acute osseous abnormality  identified. Cervical spine detailed separately. Sinuses/Orbits: Resolved sinus fluid seen last year. Visualized paranasal sinuses and mastoids are well aerated. Other: No acute orbit or scalp soft tissue injury identified. IMPRESSION: 1. No acute intracranial abnormality or acute traumatic injury identified. 2. Cervical spine CT detailed separately. Electronically Signed   By: Genevie Ann M.D.   On: 10/06/2022 05:55   DG Chest Portable 1 View  Result Date: 10/06/2022 CLINICAL DATA:  67 year old male status post chest tube placement. EXAM: PORTABLE CHEST 1 VIEW COMPARISON:  Chest x-ray 10/06/2022. FINDINGS: Interval placement of a small bore right-sided chest tube with the tip reformed over the lower right hemithorax. Previously noted right-sided pneumothorax has substantially decreased, now small occupying approximately 5% of the volume of the right hemithorax. There is subcutaneous emphysema in the right chest wall laterally. Lungs appear clear bilaterally. No left pneumothorax. No pleural effusions. No evidence of pulmonary edema. Heart size is normal. Upper mediastinal contours are within normal limits. Mild elevation of the right hemidiaphragm. IMPRESSION: 1. Interval placement of right-sided chest tube with near complete resolution of the previously noted right-sided pneumothorax, as above. Electronically Signed   By: Vinnie Langton M.D.   On: 10/06/2022 05:11   DG Chest 2 View  Result Date: 10/06/2022 CLINICAL DATA:  Dyspnea EXAM: CHEST - 2 VIEW COMPARISON:  09/20/2022 FINDINGS: Large right pneumothorax has developed collapse of the right lung. Minimal subcutaneous gas within the right chest wall. No definite radiographic evidence of tension physiology. Left lung is clear. No pneumothorax on the left. No pleural  effusion. Cardiac size within normal limits. Pulmonary vascularity is normal. IMPRESSION: 1. Large right pneumothorax. No definite radiographic evidence of tension physiology. Electronically  Signed   By: Fidela Salisbury M.D.   On: 10/06/2022 02:43    Anti-infectives: Anti-infectives (From admission, onward)    None        Assessment/Plan 62M s/p GLF  R 5th rib fx and PTX - Chest tube placed 3/19. On -20. CXR this am pending. Possible WS today if PTX improved/stable. Repeat CXR in the AM. Pulm toilet/IS. Multimodal pain control. PT/OT. EtOH abuse - Etoh 301 on arrival. Hx DT's. CIWA. Cont spiritus frumenti until dispo determined. Reports drinking beer/vodka at baseline. TOC c/s. Tachycardia - May be 2/2 withdraw but will also consider other etiologies as well. Getting Ativan and spiritus frumenti now. Repeat vitals (temp, BP, pulse ox). Will get formal EKG as well. Repeat labs now. Add IVF with poor po intake. PRN lopressor.   Housing insecurity - TOC consult. Reports he lives on someone's front porch. Will not say who this person is. Declines any help at d/c. Hx PE (2018 per chart review) - not currently taking anything for this.  Hypokalemia - 3.0 yesterday. Repeat labs pending.  FEN - reg diet, start IVF at 53ml/hr with poor po intake DVT - SCDs, Lovenox  ID - None Foley - none, voiding Dispo - As above.   Addendum: Patient now febrile, tachycardic and intermittently hypertensive. He is anxious/agitated and with at least a hand tremor. He received Ativan at [place] and [place] at [place]. Tylenol given at 0651 and Ibuprofen at 0914. Remains febrile and tachycardic. TRN is going to repeat CIWA. Will need tx to  ICU for monitoring as he may need precedex. Further w/u w/ labs (stat) still pending. RN checking with lab on status of this. EKG. Lopressor given and IVF bolus also ordered. I have been in frequent contact with RN about this patient. Discussed with MD who agree's with plan.   In chart review it does looks like during admission from 3/4 - 3/5 he was noted to have C. Diff colitis and tx with po vanc. Confirmed with pharmacy that patient did fill po vanc as outpatient  but it is unclear if he took all of this. Noted he does also have hx of PAF per notes.   I reviewed nursing notes, last 24 h vitals and pain scores, last 48 h intake and output, last 24 h labs and trends, and last 24 h imaging results.   LOS: 1 day    Jillyn Ledger , [facility] 10/07/2022, 7:11 AM Please see Amion for pager number during day hours 7:00am-4:30pm

## 2022-10-08 ENCOUNTER — Inpatient Hospital Stay (HOSPITAL_COMMUNITY): Payer: 59

## 2022-10-08 MED ORDER — PHENOBARBITAL 32.4 MG PO TABS
64.8000 mg | ORAL_TABLET | Freq: Three times a day (TID) | ORAL | Status: DC
  Administered 2022-10-09: 64.8 mg via ORAL
  Filled 2022-10-08: qty 2

## 2022-10-08 MED ORDER — PHENOBARBITAL 32.4 MG PO TABS: 32.4000 mg | ORAL_TABLET | Freq: Three times a day (TID) | ORAL | Status: DC

## 2022-10-08 MED ORDER — PHENOBARBITAL 32.4 MG PO TABS
97.2000 mg | ORAL_TABLET | Freq: Three times a day (TID) | ORAL | Status: AC
  Administered 2022-10-08 – 2022-10-09 (×3): 97.2 mg via ORAL
  Filled 2022-10-08 (×3): qty 3

## 2022-10-08 MED ORDER — METHOCARBAMOL 500 MG PO TABS
500.0000 mg | ORAL_TABLET | Freq: Four times a day (QID) | ORAL | Status: DC | PRN
  Administered 2022-10-08: 500 mg via ORAL
  Filled 2022-10-08: qty 1

## 2022-10-08 NOTE — Progress Notes (Signed)
Physical Therapy Treatment Patient Details Name: Anthony [name] MRN: [id number] DOB: 09/02/1955 Today's Date: 10/08/2022   History of Present Illness 67 y.o. male presents to [facility] hospital on 10/06/2022 after syncopal episode and fall. Imaging shows R 5th rib fx and PTX. PT also requesting help for detox. PMH includes alcohol abuse, housing insecurity, PE, depression, HTN, rhabdomyolysis.    PT Comments    The pt was agreeable to session, states he is worried about the physical demands of his life once d/c from hospital. However, he was able to demo good improvement in gait distance this morning with mostly minG for safety while pt pushed IV pole. He did have single LOB while changing direction in bathroom without UE support.  Will continue to benefit from skilled PT to progress endurance and dynamic stability to allow for safest d/c home. VSS on RA during session.     Recommendations for follow up therapy are one component of a multi-disciplinary discharge planning process, led by the attending physician.  Recommendations may be updated based on patient status, additional functional criteria and insurance authorization.  Follow Up Recommendations  No PT follow up     Assistance Recommended at Discharge PRN  Patient can return home with the following Help with stairs or ramp for entrance;Assist for transportation   Equipment Recommendations  Cane    Recommendations for Other Services       Precautions / Restrictions Precautions Precautions: Fall Precaution Comments: R chest tube Restrictions Weight Bearing Restrictions: No     Mobility  Bed Mobility Overal bed mobility: Modified Independent                  Transfers Overall transfer level: Needs assistance Equipment used: None Transfers: Sit to/from Stand Sit to Stand: Min guard           General transfer comment: no assist, BP stable.    Ambulation/Gait Ambulation/Gait assistance: Min guard Gait Distance  (Feet): 200 Feet Assistive device: IV Pole Gait Pattern/deviations: Step-through pattern, Decreased stride length, Drifts right/left Gait velocity: reduced Gait velocity interpretation: <1.31 ft/sec, indicative of household ambulator   General Gait Details: pt holding IV pole, mild instability. single LOB when changing direction without UE support in bathroom needing minA     Balance Overall balance assessment: Needs assistance Sitting-balance support: Feet supported, No upper extremity supported Sitting balance-Leahy Scale: Good     Standing balance support: No upper extremity supported, During functional activity Standing balance-Leahy Scale: Fair                              Cognition Arousal/Alertness: Awake/alert Behavior During Therapy: WFL for tasks assessed/performed Overall Cognitive Status: No family/caregiver present to determine baseline cognitive functioning                                 General Comments: pt conversational, able to express concerns about returning to normal life when d/c. tangential and expressing same concerns multiple times in session though        Exercises      General Comments General comments (skin integrity, edema, etc.): VSS on RA      Pertinent Vitals/Pain Pain Assessment Pain Assessment: Faces Faces Pain Scale: Hurts Trolinger more Pain Location: R flank and chest Pain Descriptors / Indicators: Aching Pain Intervention(s): Monitored during session     PT Goals (current goals can now  be found in the care plan section) Acute Rehab PT Goals Patient Stated Goal: to return to independence PT Goal Formulation: With patient Time For Goal Achievement: 10/20/22 Potential to Achieve Goals: Good Progress towards PT goals: Progressing toward goals    Frequency    Min 3X/week      PT Plan Current plan remains appropriate       AM-PAC PT "6 Clicks" Mobility   Outcome Measure  Help needed turning  from your back to your side while in a flat bed without using bedrails?: None Help needed moving from lying on your back to sitting on the side of a flat bed without using bedrails?: None Help needed moving to and from a bed to a chair (including a wheelchair)?: A Buckingham Help needed standing up from a chair using your arms (e.g., wheelchair or bedside chair)?: A Burget Help needed to walk in hospital room?: A Matlack Help needed climbing 3-5 steps with a railing? : A Heckard 6 Click Score: 20    End of Session Equipment Utilized During Treatment: Gait belt Activity Tolerance: Patient tolerated treatment well Patient left: with call bell/phone within reach;in chair;with chair alarm set Nurse Communication: Mobility status PT Visit Diagnosis: Difficulty in walking, not elsewhere classified (R26.2)     Time: [id number] PT Time Calculation (min) (ACUTE ONLY): 49 min  Charges:  $Gait Training: 8-22 mins $Therapeutic Exercise: 8-22 mins $Therapeutic Activity: 8-22 mins                     West Carbo, PT, DPT   Acute Rehabilitation Department Office [phone] Secure Chat Communication Preferred   Sandra Cockayne 10/08/2022, 10:15 AM

## 2022-10-08 NOTE — Progress Notes (Addendum)
Patient ID: Winfield Cunas, male   DOB: 12-24-1955, 67 y.o.   MRN: [id number] Follow up - Trauma Critical Care   Patient Details:    Glenard A Mendosa is an 67 y.o. male.  Lines/tubes : CVC Triple Lumen 10/07/22 Right Subclavian (Active)  Indication for Insertion or Continuance of Line Vasoactive infusions 10/07/22 2000  Site Assessment Clean, Dry, Intact 10/07/22 2000  Proximal Lumen Status Saline locked;Flushed;Blood return noted 10/07/22 2000  Medial Lumen Status Saline locked;Flushed;Blood return noted 10/07/22 2000  Distal Lumen Status Infusing 10/07/22 2000  Dressing Type Transparent 10/07/22 2000  Dressing Status Antimicrobial disc in place 10/07/22 [place] checked and tightened 10/07/22 2000  Dressing Change Due 10/14/22 10/07/22 2000     Chest Tube 1 Lateral;Right Pleural 14 Fr. (Active)  Status -20 cm H2O 10/08/22 0800  Chest Tube Air Leak None 10/08/22 0800  Patency Intervention Tip/tilt 10/08/22 0800  Drainage Description Serosanguineous 10/08/22 0800  Dressing Status Clean, Dry, Intact 10/08/22 0800  Dressing Intervention Dressing reinforced 10/06/22 1945  Site Assessment Clean, Dry, Intact 10/08/22 0800  Surrounding Skin Dry;Intact 10/08/22 0800  Output (mL) 0 mL 10/08/22 0800    Microbiology/Sepsis markers: Results for orders placed or performed during the hospital encounter of 09/20/22  Blood Culture (routine x 2)     Status: None   Collection Time: 09/20/22  7:53 PM   Specimen: BLOOD  Result Value Ref Range Status   Specimen Description   Final    BLOOD RIGHT ANTECUBITAL Performed at [facility] [address] [place], [place] [address]    Special Requests   Final    BOTTLES DRAWN AEROBIC AND ANAEROBIC Blood Culture results may not be optimal due to an inadequate volume of blood received in culture bottles Performed at [facility] [address], [place], [place] [address]    Culture   Final    NO  GROWTH 5 DAYS Performed at [facility], [place] [address] [place], [place] [address]    Report Status 09/25/2022 FINAL  Final  Blood Culture (routine x 2)     Status: None   Collection Time: 09/20/22  7:53 PM   Specimen: BLOOD  Result Value Ref Range Status   Specimen Description   Final    BLOOD LEFT ANTECUBITAL Performed at [facility] [address], [place], [place] [address]    Special Requests   Final    BOTTLES DRAWN AEROBIC AND ANAEROBIC Blood Culture adequate volume Performed at [facility] [address] [place], [place] [address]    Culture   Final    NO GROWTH 5 DAYS Performed at [facility], [place] [address] [place], [place] [address]    Report Status 09/25/2022 FINAL  Final  Resp panel by RT-PCR (RSV, Flu A&B, Covid) Anterior Nasal Swab     Status: None   Collection Time: 09/20/22  8:08 PM   Specimen: Anterior Nasal Swab  Result Value Ref Range Status   SARS Coronavirus 2 by RT PCR NEGATIVE NEGATIVE Final    Comment: (NOTE) SARS-CoV-2 target nucleic acids are NOT DETECTED.  The SARS-CoV-2 RNA is generally detectable in upper respiratory specimens during the acute phase of infection. The lowest concentration of SARS-CoV-2 viral copies this assay can detect is 138 copies/mL. A negative result does not preclude SARS-Cov-2 infection and should not be used as the sole basis for treatment or other patient management decisions. A negative  result may occur with  improper specimen collection/handling, submission of specimen other than nasopharyngeal swab, presence of viral mutation(s) within the areas targeted by this assay, and inadequate number of viral copies(<138 copies/mL). A negative result must be combined with clinical observations, patient history, and epidemiological information. The expected result is Negative.  Fact Sheet for Patients:  EntrepreneurPulse.com.au  Fact Sheet for  Healthcare Providers:  IncredibleEmployment.be  This test is no t yet approved or cleared by the [place] FDA and  has been authorized for detection and/or diagnosis of SARS-CoV-2 by FDA under an Emergency Use Authorization (EUA). This EUA will remain  in effect (meaning this test can be used) for the duration of the COVID-19 declaration under Section 564(b)(1) of the Act, 21 U.S.C.section 360bbb-3(b)(1), unless the authorization is terminated  or revoked sooner.       Influenza A by PCR NEGATIVE NEGATIVE Final   Influenza B by PCR NEGATIVE NEGATIVE Final    Comment: (NOTE) The Xpert Xpress SARS-CoV-2/FLU/RSV plus assay is intended as an aid in the diagnosis of influenza from Nasopharyngeal swab specimens and should not be used as a sole basis for treatment. Nasal washings and aspirates are unacceptable for Xpert Xpress SARS-CoV-2/FLU/RSV testing.  Fact Sheet for Patients: EntrepreneurPulse.com.au  Fact Sheet for Healthcare Providers: IncredibleEmployment.be  This test is not yet approved or cleared by the [place] FDA and has been authorized for detection and/or diagnosis of SARS-CoV-2 by FDA under an Emergency Use Authorization (EUA). This EUA will remain in effect (meaning this test can be used) for the duration of the COVID-19 declaration under Section 564(b)(1) of the Act, 21 U.S.C. section 360bbb-3(b)(1), unless the authorization is terminated or revoked.     Resp Syncytial Virus by PCR NEGATIVE NEGATIVE Final    Comment: (NOTE) Fact Sheet for Patients: EntrepreneurPulse.com.au  Fact Sheet for Healthcare Providers: IncredibleEmployment.be  This test is not yet approved or cleared by the [place] FDA and has been authorized for detection and/or diagnosis of SARS-CoV-2 by FDA under an Emergency Use Authorization (EUA). This EUA will remain in effect (meaning this  test can be used) for the duration of the COVID-19 declaration under Section 564(b)(1) of the Act, 21 U.S.C. section 360bbb-3(b)(1), unless the authorization is terminated or revoked.  Performed at [facility], [place] [address], [place], [place] [address]   Respiratory (~20 pathogens) panel by PCR     Status: None   Collection Time: 09/20/22  9:25 PM   Specimen: Nasopharyngeal Swab; Respiratory  Result Value Ref Range Status   Adenovirus NOT DETECTED NOT DETECTED Final   Coronavirus 229E NOT DETECTED NOT DETECTED Final    Comment: (NOTE) The Coronavirus on the Respiratory Panel, DOES NOT test for the novel  Coronavirus (2019 nCoV)    Coronavirus HKU1 NOT DETECTED NOT DETECTED Final   Coronavirus NL63 NOT DETECTED NOT DETECTED Final   Coronavirus OC43 NOT DETECTED NOT DETECTED Final   Metapneumovirus NOT DETECTED NOT DETECTED Final   Rhinovirus / Enterovirus NOT DETECTED NOT DETECTED Final   Influenza A NOT DETECTED NOT DETECTED Final   Influenza B NOT DETECTED NOT DETECTED Final   Parainfluenza Virus 1 NOT DETECTED NOT DETECTED Final   Parainfluenza Virus 2 NOT DETECTED NOT DETECTED Final   Parainfluenza Virus 3 NOT DETECTED NOT DETECTED Final   Parainfluenza Virus 4 NOT DETECTED NOT DETECTED Final   Respiratory Syncytial Virus NOT DETECTED NOT DETECTED Final   Bordetella pertussis NOT DETECTED NOT DETECTED Final   Bordetella  Parapertussis NOT DETECTED NOT DETECTED Final   Chlamydophila pneumoniae NOT DETECTED NOT DETECTED Final   Mycoplasma pneumoniae NOT DETECTED NOT DETECTED Final    Comment: Performed at [facility], [place] [address] [place], [place] [address]    Anti-infectives:  Anti-infectives (From admission, onward)    None      Consults: Treatment Team:  Md, Trauma, MD    Studies:    Events:  Subjective:    Overnight Issues:  Pressors off now, more calm Objective:  Vital signs for last 24 hours: Temp:  [96.9 F (36.1  C)-100.1 F (37.8 C)] 98.1 F (36.7 C) (03/21 0800) Pulse Rate:  [77-109] 99 (03/21 0800) Resp:  [16-28] 21 (03/21 0800) BP: (68-107)/(47-76) 102/66 (03/21 0800) SpO2:  [90 %-99 %] 93 % (03/21 0800) Arterial Line BP: (95-140)/(50-68) 115/57 (03/21 0500)  Hemodynamic parameters for last 24 hours:    Intake/Output from previous day: 03/20 0701 - 03/21 0700 In: 2654.3 [P.O.:480; I.V.:2174.3] Out: 1276 [Urine:1200; Chest Tube:76]  Intake/Output this shift: Total I/O In: 125 [I.V.:125] Out: 0   Vent settings for last 24 hours:    Physical Exam:  General: alert and no respiratory distress Neuro: follows commands HEENT/Neck: no JVD Resp: clear to auscultation bilaterally CVS: RRR GI: soft, NT, R chest wall tender Extremities: calves soft  No results found for this or any previous visit (from the past 24 hour(s)).  Assessment & Plan: Present on Admission:  Traumatic pneumothorax, initial encounter    LOS: 2 days   Additional comments:I reviewed the patient's new clinical lab test results. / 74M s/p GLF  R 5th rib fx and PTX - Chest tube placed 3/19. On -20. CXR this am pending. Repeat CXR today shows tiny PTX. Observe on water seal. Possible removal tomorrow. Pulm toilet/IS. Multimodal pain control. PT/OT. EtOH abuse - Etoh 301 on arrival. Hx DT's. CIWA. Cont spiritus frumenti until dispo determined. Reports drinking beer/vodka at baseline. TOC c/s. Phenobarb protocol Tachycardia - due to ETOH WD, improved, off pressors Housing insecurity - TOC consult. Reports he lives on someone's front porch. Will not say who this person is. Declines any help at d/c. Hx PE (2018 per chart review) - not currently taking anything for this.  FEN - reg diet, decrease IVF to 50/h DVT - SCDs, Lovenox  ID - None Foley - none, voiding Dispo - ICU due to ETOH withdrawal, off pressors  Critical Care Total Time*: 32 Minutes  Georganna Skeans, MD, MPH, FACS Trauma & General Surgery Use  [email or web address] to contact on call provider  10/08/2022  *Care during the described time interval was provided by me. I have reviewed this patient's available data, including medical history, events of note, physical examination and test results as part of my evaluation.

## 2022-10-08 NOTE — Evaluation (Signed)
Occupational Therapy Evaluation Patient Details Name: Anthony [name] MRN: [id number] DOB: 09-24-1955 Today's Date: 10/08/2022   History of Present Illness 67 y.o. male presents to [facility] hospital on 10/06/2022 after syncopal episode and fall. Imaging shows R 5th rib fx and PTX. PT also requesting help for detox. PMH includes alcohol abuse, housing insecurity, PE, depression, HTN, rhabdomyolysis.   Clinical Impression   Pt currently supervision level for simulated selfcare tasks and toilet transfers secondary to IV lines and chest tube.  HR elevated from 90s up to 120 with transfers and mobility.  Oxygen sats at 95% or better on room air throughout.  Unsure of discharge situation as [place].  He states that we can expect him to do better since he's been in the hospital a few days and had rest as opposed to him having to sleep on the ground and get up and try to move when it's cold.  Feel he does not need further acute OT treatment at this time as functionally feel he is close to baseline.  He would benefit from assistance to find housing or a place to stay and assist to help gain control of his ETOH.        Recommendations for follow up therapy are one component of a multi-disciplinary discharge planning process, led by the attending physician.  Recommendations may be updated based on patient status, additional functional criteria and insurance authorization.   Follow Up Recommendations  No OT follow up     Assistance Recommended at Discharge PRN     Functional Status Assessment  Patient has not had a recent decline in their functional status  Equipment Recommendations  None recommended by OT    Recommendations for Other Services       Precautions / Restrictions Precautions Precautions: Fall Precaution Comments: R chest tube Restrictions Weight Bearing Restrictions: No      Mobility Bed Mobility Overal bed mobility: Modified Independent                   Transfers Overall transfer level: Needs assistance Equipment used: None Transfers: Sit to/from Stand, Bed to chair/wheelchair/BSC Sit to Stand: Supervision     Step pivot transfers: Supervision     General transfer comment: Pt supervision level for functional mobility holding onto the IV pole.      Balance Overall balance assessment: Mild deficits observed, not formally tested   Sitting balance-Leahy Scale: Normal     Standing balance support: No upper extremity supported, During functional activity Standing balance-Leahy Scale: Fair                             ADL either performed or assessed with clinical judgement   ADL Overall ADL's : At baseline                                       General ADL Comments: Pt currently supervision for simulated selfcare tasks secondary to lines and tubes.  Feel he is close to baseline functionally.  Unsure of discharge plan and situation so no recommendation for DME at this time.     Vision Baseline Vision/History: 1 Wears glasses Ability to See in Adequate Light: 0 Adequate Patient Visual Report: No change from baseline Vision Assessment?: No apparent visual deficits     Perception  [facility]   Praxis  WFL    Pertinent Vitals/Pain Pain Assessment Pain Assessment: 0-10 Faces Pain Scale: Hurts a Blankenbeckler bit Pain Location: right ribs Pain Descriptors / Indicators: Discomfort Pain Intervention(s): Limited activity within patient's tolerance, Monitored during session, Repositioned, Other (comment) (pillow given for support with coughing)     Hand Dominance Right   Extremity/Trunk Assessment Upper Extremity Assessment Upper Extremity Assessment: RUE deficits/detail RUE Deficits / Details: shoulder flexion 0-80 degrees secondary to rib/ flank pain, all other joints AROM WFLs and strength 4/5 throughout RUE Sensation: WNL   Lower Extremity Assessment Lower Extremity Assessment: Defer to PT evaluation    Cervical / Trunk Assessment Cervical / Trunk Assessment: Kyphotic   Communication Communication Communication: No difficulties   Cognition Arousal/Alertness: Awake/alert Behavior During Therapy: WFL for tasks assessed/performed Overall Cognitive Status: No family/caregiver present to determine baseline cognitive functioning                                 General Comments: Pt appropriate with conversation throughout session.  He was able to state month but questioned therapist if there would be "bowl games" today.  Therapist corrected him that it was basketball season instead.     General Comments  VSS on RA            Home Living Family/patient expects to be discharged to:: Shelter/Homeless Living Arrangements: Other (Comment)   Type of Home: Other(Comment)                       Home Equipment: None   Additional Comments: pt is not forthcoming in his living arrangements, reports he was sleeping in the park prior to fall but does not state this is where he was living      Prior Functioning/Environment Prior Level of Function : Independent/Modified Independent             Mobility Comments: reports not using DME although previously owned canes and walkers. Reports he easily loses them           AM-PAC OT "6 Clicks" Daily Activity     Outcome Measure Help from another person eating meals?: None Help from another person taking care of personal grooming?: None Help from another person toileting, which includes using toliet, bedpan, or urinal?: A Fleissner Help from another person bathing (including washing, rinsing, drying)?: A Vick Help from another person to put on and taking off regular upper body clothing?: None Help from another person to put on and taking off regular lower body clothing?: None 6 Click Score: 22   End of Session Nurse Communication: Mobility status  Activity Tolerance: Patient tolerated treatment well Patient left:  in bed;with call bell/phone within reach                   Time: 1145-1237 OT Time Calculation (min): 52 min Charges:  OT General Charges $OT Visit: 1 Visit OT Evaluation $OT Eval Moderate Complexity: 1 Mod OT Treatments $Self Care/Home Management : 38-52 mins Clyda Greener, OTR/L [place]  Office [phone] 10/08/2022

## 2022-10-09 ENCOUNTER — Inpatient Hospital Stay (HOSPITAL_COMMUNITY): Payer: 59

## 2022-10-09 ENCOUNTER — Other Ambulatory Visit (HOSPITAL_COMMUNITY): Payer: Self-pay

## 2022-10-09 ENCOUNTER — Inpatient Hospital Stay (HOSPITAL_COMMUNITY)
Admission: EM | Admit: 2022-10-09 | Discharge: 2022-10-13 | DRG: 308 | Disposition: A | Discharge status: Home / Self Care | Payer: 59 | Attending: Internal Medicine | Admitting: Internal Medicine

## 2022-10-09 DIAGNOSIS — Z86711 Personal history of pulmonary embolism: Secondary | ICD-10-CM

## 2022-10-09 DIAGNOSIS — Z8249 Family history of ischemic heart disease and other diseases of the circulatory system: Secondary | ICD-10-CM

## 2022-10-09 DIAGNOSIS — I4891 Unspecified atrial fibrillation: Principal | ICD-10-CM | POA: Diagnosis present

## 2022-10-09 DIAGNOSIS — I110 Hypertensive heart disease with heart failure: Secondary | ICD-10-CM | POA: Diagnosis present

## 2022-10-09 DIAGNOSIS — Z820 Family history of epilepsy and other diseases of the nervous system: Secondary | ICD-10-CM

## 2022-10-09 DIAGNOSIS — G929 Unspecified toxic encephalopathy: Secondary | ICD-10-CM | POA: Diagnosis present

## 2022-10-09 DIAGNOSIS — Z79899 Other long term (current) drug therapy: Secondary | ICD-10-CM

## 2022-10-09 DIAGNOSIS — F10129 Alcohol abuse with intoxication, unspecified: Secondary | ICD-10-CM | POA: Diagnosis present

## 2022-10-09 DIAGNOSIS — E876 Hypokalemia: Secondary | ICD-10-CM | POA: Diagnosis present

## 2022-10-09 DIAGNOSIS — I5032 Chronic diastolic (congestive) heart failure: Secondary | ICD-10-CM | POA: Diagnosis present

## 2022-10-09 DIAGNOSIS — F1010 Alcohol abuse, uncomplicated: Secondary | ICD-10-CM | POA: Diagnosis not present

## 2022-10-09 DIAGNOSIS — E785 Hyperlipidemia, unspecified: Secondary | ICD-10-CM | POA: Diagnosis present

## 2022-10-09 DIAGNOSIS — I10 Essential (primary) hypertension: Secondary | ICD-10-CM | POA: Diagnosis present

## 2022-10-09 DIAGNOSIS — I4811 Longstanding persistent atrial fibrillation: Principal | ICD-10-CM

## 2022-10-09 DIAGNOSIS — Y900 Blood alcohol level of less than 20 mg/100 ml: Secondary | ICD-10-CM | POA: Diagnosis present

## 2022-10-09 DIAGNOSIS — J690 Pneumonitis due to inhalation of food and vomit: Secondary | ICD-10-CM | POA: Diagnosis present

## 2022-10-09 DIAGNOSIS — F17200 Nicotine dependence, unspecified, uncomplicated: Secondary | ICD-10-CM | POA: Diagnosis present

## 2022-10-09 DIAGNOSIS — Z5900 Homelessness unspecified: Secondary | ICD-10-CM

## 2022-10-09 DIAGNOSIS — Z7901 Long term (current) use of anticoagulants: Secondary | ICD-10-CM

## 2022-10-09 DIAGNOSIS — Z85828 Personal history of other malignant neoplasm of skin: Secondary | ICD-10-CM

## 2022-10-09 DIAGNOSIS — Z720 Tobacco use: Secondary | ICD-10-CM

## 2022-10-09 DIAGNOSIS — G8929 Other chronic pain: Secondary | ICD-10-CM | POA: Diagnosis present

## 2022-10-09 DIAGNOSIS — N401 Enlarged prostate with lower urinary tract symptoms: Secondary | ICD-10-CM | POA: Diagnosis present

## 2022-10-09 DIAGNOSIS — Z823 Family history of stroke: Secondary | ICD-10-CM

## 2022-10-09 DIAGNOSIS — Z7951 Long term (current) use of inhaled steroids: Secondary | ICD-10-CM

## 2022-10-09 DIAGNOSIS — R32 Unspecified urinary incontinence: Secondary | ICD-10-CM | POA: Diagnosis present

## 2022-10-09 DIAGNOSIS — Z91148 Patient's other noncompliance with medication regimen for other reason: Secondary | ICD-10-CM

## 2022-10-09 DIAGNOSIS — Z91048 Other nonmedicinal substance allergy status: Secondary | ICD-10-CM

## 2022-10-09 DIAGNOSIS — Z809 Family history of malignant neoplasm, unspecified: Secondary | ICD-10-CM

## 2022-10-09 DIAGNOSIS — M545 Low back pain, unspecified: Secondary | ICD-10-CM | POA: Diagnosis present

## 2022-10-09 LAB — BASIC METABOLIC PANEL
Anion gap: 8 (ref 5–15)
BUN: 8 mg/dL (ref 8–23)
CO2: 20 mmol/L — ABNORMAL LOW (ref 22–32)
Calcium: 7.6 mg/dL — ABNORMAL LOW (ref 8.9–10.3)
Chloride: 106 mmol/L (ref 98–111)
Creatinine, Ser: 0.6 mg/dL — ABNORMAL LOW (ref 0.61–1.24)
GFR, Estimated: >60 mL/min (ref >60)
Glucose, Bld: 105 mg/dL — ABNORMAL HIGH (ref 70–99)
Potassium: 3.3 mmol/L — ABNORMAL LOW (ref 3.5–5.1)
Sodium: 134 mmol/L — ABNORMAL LOW (ref 135–145)

## 2022-10-09 LAB — CBC
HCT: 26.7 % — ABNORMAL LOW (ref 39.0–52.0)
Hemoglobin: 8.4 g/dL — ABNORMAL LOW (ref 13.0–17.0)
MCH: 32.9 pg (ref 26.0–34.0)
MCHC: 31.5 g/dL (ref 30.0–36.0)
MCV: 104.7 fL — ABNORMAL HIGH (ref 80.0–100.0)
Platelets: 162 10*3/uL (ref 150–400)
RBC: 2.55 MIL/uL — ABNORMAL LOW (ref 4.22–5.81)
RDW: 14.8 % (ref 11.5–15.5)
WBC: 9.5 10*3/uL (ref 4.0–10.5)
nRBC: 0 % (ref 0.0–0.2)

## 2022-10-09 LAB — MRSA NEXT GEN BY PCR, NASAL: MRSA by PCR Next Gen: NOT DETECTED

## 2022-10-09 MED ORDER — OXYCODONE HCL 5 MG PO TABS
5.0000 mg | ORAL_TABLET | ORAL | 0 refills | Status: DC | PRN
  Filled 2022-10-09: qty 20, 4d supply, fill #0

## 2022-10-09 MED ORDER — ORAL CARE MOUTH RINSE: 15.0000 mL | OROMUCOSAL | Status: DC | PRN

## 2022-10-09 MED ORDER — METHOCARBAMOL 500 MG PO TABS
500.0000 mg | ORAL_TABLET | Freq: Four times a day (QID) | ORAL | 0 refills | Status: DC | PRN
  Filled 2022-10-09: qty 60, 15d supply, fill #0

## 2022-10-09 MED ORDER — ACETAMINOPHEN 500 MG PO TABS: 1000.0000 mg | ORAL_TABLET | Freq: Four times a day (QID) | ORAL | 0 refills | Status: DC | PRN

## 2022-10-09 MED ORDER — GUAIFENESIN 100 MG/5ML PO LIQD: 5.0000 mL | ORAL | Status: DC | PRN

## 2022-10-09 NOTE — Progress Notes (Signed)
   10/09/22 1331  SDOH Interventions  Food Insecurity Interventions Inpatient TOC;Other (Comment)   Patient given list of food pantries/soup kitchens in [place].    Reinaldo Raddle, RN, BSN  Trauma/Neuro ICU Case Manager [phone]

## 2022-10-09 NOTE — Progress Notes (Signed)
Pt transferred to 4NP05 .

## 2022-10-09 NOTE — Discharge Summary (Signed)
Patient ID: Anthony [name] [id number] August 22, 1955 67 y.o.  Admit date: 10/06/2022 Discharge date: 10/09/2022  Admitting Diagnosis: GLF R 5th rib fx and PTX  EtOH abuse   Discharge Diagnosis Patient Active Problem List   Diagnosis Date Noted   Traumatic pneumothorax, initial encounter 10/06/2022   SIRS (systemic inflammatory response syndrome) (HCC) 09/21/2022   Homelessness 09/21/2022   Atrial fibrillation ([place]) 09/12/2022   Moderate protein malnutrition (HCC) 09/11/2022   Chronic diarrhea 09/11/2022   C. difficile colitis 09/11/2022   Aspiration pneumonia ([place]) 08/27/2022   Aortic atherosclerosis ([place]) 08/27/2022   Depression, unspecified 08/09/2022   Hyponatremia 06/01/2022   Non compliance w medication regimen 05/31/2022   Delirium 05/25/2022   Stricture and stenosis of esophagus 05/21/2022   Gastritis and gastroduodenitis 05/21/2022   Loose stools 05/20/2022   Malnutrition of moderate degree 05/20/2022   Acute on chronic congestive heart failure (HCC)    Leukocytosis 05/19/2022   Sepsis ([place]) 05/19/2022   Cellulitis of left leg, possible 05/19/2022   Moniliasis, interdigital 05/19/2022   Open toe wound, right foot, medial fourth digit 05/19/2022   Malnutrition ([place]) 05/19/2022   Alcohol abuse 03/24/2022   Dyslipidemia 03/24/2022   Acute on chronic diastolic CHF (congestive heart failure) ([place]) 03/24/2022   Acute respiratory failure with hypoxia (HCC) 03/23/2022   Generalized weakness    Atrial fibrillation with RVR ([place]) 03/15/2022   Hypokalemia 03/15/2022   Iron deficiency anemia 10/16/2020   Cough    Hypomagnesemia    Protein-calorie malnutrition, severe 08/22/2020   Alcohol use disorder, severe, in controlled environment ([place])    Pressure injury of skin 08/18/2020   Urinary tract infection without hematuria    Severe sepsis ([place]) 08/14/2020   Paroxysmal atrial fibrillation ([place]) 08/13/2020   Rhabdomyolysis 07/22/2020   Cerebral ventriculomegaly  07/22/2020   Hemoglobin decreased 07/22/2020   History of delirium tremons 07/22/2020   Hypoalbuminemia due to protein-calorie malnutrition ([place]) 07/22/2020   Tobacco use disorder 07/22/2020   Alcohol withdrawal delirium ([place])    Dehydration    Alcohol abuse with alcohol-induced mood disorder ([place]) 07/18/2018   Depression 05/26/2018   Anxiety state 04/25/2018   Need for immunization against influenza 04/25/2018   Alcohol withdrawal ([place]) 09/01/2017   DOE (dyspnea on exertion) 11/27/2016   Actinic keratosis 11/27/2016   Macrocytic anemia 11/23/2016   History of pulmonary embolus (PE) 11/02/2016   Hiatal hernia 09/14/2016   Skin cancer 08/13/2016   Poor social situation 06/09/2013   Tinea versicolor 06/08/2013   Back pain 05/07/2013   Seborrheic dermatitis of scalp 11/08/2012   HTN (hypertension) 04/11/2012   Alcohol use disorder, severe, dependence ([place]) 09/16/2006  GLF  R 5th rib fx and PTX EtOH abuse  Tachycardia  Housing insecurity Hx PE (2018 per chart review)   Consultants none  Reason for Admission:  62M with h/o alcohol abuse and prior DTs, housing insecurity, unprovoked PE (supposed to be on Xarelto, but is not). Reports attempting to go into the woods, but "never made it" because he passed out. When asked if he passed out from drinking prior, endorses he had been drinking, but "it was not that time of the day yet". Reports landing on his right side. History is difficult to obtain due to tangential speech and majority is obtained from chart review and discussion with ED PA.   Procedures L thoracostomy tube placement, 10/06/22 Dr. Donovan Kail Course:  The patient was admitted and treated for his PTX with a chest tube.  Routine  care was provided for this. As this resolved, his chest tube was removed with no recurrence.  He had pain management, pulmonary toileting, and IS use for his rib fractures.  He also worked with therapies for mobilization with no PT or OT  follow up recommended.  He did have DTs on HD1 despite CIWA being ordered (was not followed initially) and patient refused his beer that was ordered.  He was moved to the unit for phenobarbital dosing.  His DTs improved dramatically.  This was not continued at discharge as per pharmacy's recommendations as he had only been on this for 36 hours and he is agreeable to drinking his beer now.  He was otherwise stable on HD 3 for DC home.  Physical Exam: Gen: NAD Heart: regular Lungs: CTAB, CT in place with no airleak.  52 cc of serous output.  CT later removed by RN staff with no complications Abd: soft, NT Ext: MAE Psych: A&Ox3  Allergies as of 10/09/2022       Reactions   Other Itching, Other (See Comments)   Seasonal allergies- Itchy eyes, runny nose, congestion   Poison Ivy Extract Rash        Medication List     TAKE these medications    acetaminophen 500 MG tablet Commonly known as: TYLENOL Take 2 tablets (1,000 mg total) by mouth every 6 (six) hours as needed. What changed:  how much to take reasons to take this   budesonide-formoterol 160-4.5 MCG/ACT inhaler Commonly known as: SYMBICORT Inhale 2 puffs into the lungs 2 (two) times daily.   cyanocobalamin 1000 MCG tablet Take 1 tablet (1,000 mcg total) by mouth daily.   famotidine 20 MG tablet Commonly known as: PEPCID Take 1 tablet (20 mg total) by mouth daily.   ferrous sulfate 325 (65 FE) MG tablet Take 1 tablet (325 mg total) by mouth daily with breakfast.   folic acid 1 MG tablet Commonly known as: FOLVITE Take 1 tablet (1 mg total) by mouth daily.   furosemide 20 MG tablet Commonly known as: LASIX Take 1 tablet (20 mg total) by mouth daily.   loperamide 2 MG capsule Commonly known as: IMODIUM Take 2 capsules (4 mg total) by mouth as needed for diarrhea or loose stools.   Magnesium Oxide 400 MG Caps Take 1 capsule (400 mg total) by mouth 2 (two) times daily.   methocarbamol 500 MG tablet Commonly  known as: ROBAXIN Take 1 tablet (500 mg total) by mouth every 6 (six) hours as needed for muscle spasms.   metoprolol tartrate 25 MG tablet Commonly known as: LOPRESSOR Take 1/2 tablet (12.5 mg total) by mouth 2 (two) times daily.   oxyCODONE 5 MG immediate release tablet Commonly known as: Oxy IR/ROXICODONE Take 1 tablet (5 mg total) by mouth every 4 (four) hours as needed for moderate pain or severe pain.   rosuvastatin 10 MG tablet Commonly known as: CRESTOR Take 1 tablet (10 mg total) by mouth daily.   Xarelto 20 MG Tabs tablet Generic drug: rivaroxaban Take 1 tablet (20 mg) by mouth daily with supper.          Follow-up Information     [facility] Follow up on 10/20/2022.   Why: Please go get a chest x-ray.  we will call you with these results to determine if you need any further follow up. Contact information: [facility] Follow up.  Why: as needed, Call if you have any questions Contact information: [place] [phone]        obtain a primary care provider as needed Follow up.                  Signed: Saverio Danker, [facility] Surgery 10/09/2022, 3:24 PM Please see Amion for pager number during day hours 7:00am-4:30pm, 7-11:30am on Weekends

## 2022-10-09 NOTE — Progress Notes (Addendum)
Physical Therapy Treatment Patient Details Name: Anthony [name] MRN: [id number] DOB: [birth year] Today's Date: 10/09/2022   History of Present Illness 67 y.o. male presents to [facility] hospital on 10/06/2022 after syncopal episode and fall. Imaging shows R 5th rib fx and PTX. PT also requesting help for detox. PMH includes alcohol abuse, housing insecurity, PE, depression, HTN, rhabdomyolysis.    PT Comments    The pt was able to demo good progress with ambulation endurance (up to 500 ft), did not use DME at this time. He reports he has no concerns about walking but does not want to mobilize any further in hospital as he will be on his feet continually after d/c as he is currently experiencing homelessness. Discussed DME such as cane and rollator to assist with stability while pt moving around outside, pt denies need stating it is more hassle to carry more items. Pt also expressed concerns about being d/c on Friday night due to lack of availability at local shelters. Passed this concern on to pt's RN. Will continue to follow during admission if pt agreeable to continue.     Recommendations for follow up therapy are one component of a multi-disciplinary discharge planning process, led by the attending physician.  Recommendations may be updated based on patient status, additional functional criteria and insurance authorization.  Follow Up Recommendations  No PT follow up     Assistance Recommended at Discharge PRN  Patient can return home with the following Help with stairs or ramp for entrance;Assist for transportation   Equipment Recommendations       Recommendations for Other Services       Precautions / Restrictions Precautions Precautions: Fall Restrictions Weight Bearing Restrictions: No     Mobility  Bed Mobility Overal bed mobility: Modified Independent             General bed mobility comments: no assist, slightly increased time    Transfers Overall transfer level: Needs  assistance Equipment used: None Transfers: Sit to/from Stand Sit to Stand: Min guard           General transfer comment: no assist, BP stable.    Ambulation/Gait Ambulation/Gait assistance: Min guard Gait Distance (Feet): 500 Feet Assistive device: None Gait Pattern/deviations: Step-through pattern, Decreased stride length, Drifts right/left Gait velocity: reduced     General Gait Details: intermittent drifting, no overt LOB or need for physical assist. HR to 138bpm      Balance Overall balance assessment: Needs assistance Sitting-balance support: Feet supported, No upper extremity supported Sitting balance-Leahy Scale: Good     Standing balance support: No upper extremity supported, During functional activity Standing balance-Leahy Scale: Fair                              Cognition Arousal/Alertness: Awake/alert Behavior During Therapy: WFL for tasks assessed/performed Overall Cognitive Status: No family/caregiver present to determine baseline cognitive functioning                                 General Comments: pt tangential, suspect some miscommunications are due to [facility].        Exercises      General Comments General comments (skin integrity, edema, etc.): HR to 138bpm, returned quickly to 100s with rest      Pertinent Vitals/Pain Pain Assessment Pain Assessment: Faces Faces Pain Scale: Hurts Bamber more Pain Location: R flank and  chest Pain Descriptors / Indicators: Aching Pain Intervention(s): Monitored during session     PT Goals (current goals can now be found in the care plan section) Acute Rehab PT Goals Patient Stated Goal: to return to independence PT Goal Formulation: With patient Time For Goal Achievement: 10/20/22 Potential to Achieve Goals: Good Progress towards PT goals: Progressing toward goals    Frequency    Min 3X/week      PT Plan Current plan remains appropriate       AM-PAC PT "6  Clicks" Mobility   Outcome Measure  Help needed turning from your back to your side while in a flat bed without using bedrails?: None Help needed moving from lying on your back to sitting on the side of a flat bed without using bedrails?: None Help needed moving to and from a bed to a chair (including a wheelchair)?: A Mcbroom Help needed standing up from a chair using your arms (e.g., wheelchair or bedside chair)?: A Medinger Help needed to walk in hospital room?: A Bonnell Help needed climbing 3-5 steps with a railing? : A Bonano 6 Click Score: 20    End of Session Equipment Utilized During Treatment: Gait belt Activity Tolerance: Patient tolerated treatment well Patient left: with call bell/phone within reach;in bed;with bed alarm set Nurse Communication: Mobility status PT Visit Diagnosis: Difficulty in walking, not elsewhere classified (R26.2)     Time: [id number] PT Time Calculation (min) (ACUTE ONLY): 29 min  Charges:  $Gait Training: 8-22 mins $Therapeutic Activity: 8-22 mins                     West Carbo, PT, DPT   Acute Rehabilitation Department Office [phone] Secure Chat Communication Preferred   Sandra Cockayne 10/09/2022, 1:01 PM

## 2022-10-09 NOTE — Progress Notes (Signed)
Mobility Specialist Progress Note   10/09/22 1430  Mobility  Activity Ambulated with assistance in hallway  Level of Assistance Contact guard assist, steadying assist  Assistive Device None  Distance Ambulated (ft) 300 ft  Range of Motion/Exercises Active;All extremities  Activity Response Tolerated well   Patient received in supine and agreeable to participate. Ambulated min guard with slow gait. Did have LOB x2 requiring min A for balance. Was slightly impulsive and needed occasional cueing for safety. Complained of intermittent dizziness that dissipated with rest. HR elevated throughout peaking at 144bpm. Returned to room without incident. Was left in supine with all needs met, call bell in reach.   Anthony [name], BS EXP Mobility Specialist Please contact via SecureChat or Rehab office at [phone]

## 2022-10-09 NOTE — Progress Notes (Signed)
   10/09/22 1334  SDOH Housing Screening (Complete 3 screening rows OR Exclusion)  Within the past 12 months, have you ever stayed: outside, in a car, in a tent, in an overnight shelter, or temporarily in someone else's home (i.e. couch-surfing) 1  Are you worried about losing your housing? 1  Do you have problems with pests (bugs, ants, mice), mold, lead and/or water leaks at the place where you stay? 0  Housing risk score 2  SDOH Interventions  Housing Interventions [facility]   Patient given shelter and low income housing resources/ added to discharge instructions.  Reinaldo Raddle, RN, BSN  Trauma/Neuro ICU Case Manager [phone]

## 2022-10-09 NOTE — Care Management Important Message (Signed)
Important Message  Patient Details  Name: Anthony [name] MRN: [id number] Date of Birth: 1956/05/22   Medicare Important Message Given:  Yes     Hannah Beat 10/09/2022, 1:40 PM

## 2022-10-09 NOTE — Progress Notes (Signed)
   10/09/22 1336  SDOH Transportation Screening (Complete 2 screening rows OR Exclusion row)  In the past 12 months, has lack of transportation kept you from medical appointments or from getting medications? yes  In the past 12 months, has lack of transportation kept you from meetings, work, or from getting things needed for daily living? Yes  SDOH Interventions  Transportation Interventions Inpatient TOC;Taxi Voucher Given;Bus Pass Given    Games developer added to discharge instructions.  Patient given Taxi voucher to desired address, and two bus passes.  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  Reinaldo Raddle, RN, BSN  Trauma/Neuro ICU Case Manager [phone]

## 2022-10-09 NOTE — TOC Transition Note (Signed)
Transition of Care [facility]) - CM/SW Discharge Note   Patient Details  Name: Anthony [name] MRN: [id number] Date of Birth: August 25, 1955  Transition of Care [facility]) CM/SW Contact:  Ella Bodo, RN Phone Number: 10/09/2022, 3:24 PM   Clinical Narrative:    Patient medically stable for discharge today; he plans to discharge back to his tent as prior to admission.  He prefers not to go to the homeless shelter, but states he will likely follow-up at the [facility] later on.  Patient given resources as listed below, as well as taxi voucher and bus pass.  Patient appreciative.  Discharge medications filled at [facility], and will be delivered to bedside prior to discharge.     Barriers to Discharge: Barriers Resolved                         Discharge Plan and Services Additional resources added to the After Visit Summary for  [employer], Transportation Resources and ETOH Rehab resources   Discharge Planning Services: CM Consult                                 Social Determinants of Health (SDOH) Interventions SDOH Screenings   Food Insecurity: Food Insecurity Present (10/09/2022)  Housing: High Risk (10/09/2022)  Transportation Needs: Unmet Transportation Needs (10/09/2022)  Utilities: Not At Risk (09/11/2022)  Depression (PHQ2-9): Medium Risk (10/16/2020)  Tobacco Use: High Risk (10/06/2022)     Readmission Risk Interventions    08/28/2022    2:00 PM 08/27/2022   10:30 AM 08/27/2022   10:08 AM  Readmission Risk Prevention Plan  Transportation Screening Complete Complete   Medication Review ([place])  Complete   PCP or Specialist appointment within 3-5 days of discharge Complete Complete Complete  HRI or [place]  Complete Complete  SW Recovery Care/Counseling Consult Complete Complete Complete  Palliative Care Screening Not Applicable Not Applicable Complete  Skilled [place] Not Applicable Not Applicable Not Applicable   Reinaldo Raddle, RN, BSN  Trauma/Neuro ICU Case Manager [phone]

## 2022-10-10 ENCOUNTER — Emergency Department (HOSPITAL_COMMUNITY): Payer: 59

## 2022-10-10 ENCOUNTER — Other Ambulatory Visit: Payer: Self-pay

## 2022-10-10 ENCOUNTER — Encounter (HOSPITAL_COMMUNITY): Payer: Self-pay | Admitting: Emergency Medicine

## 2022-10-10 DIAGNOSIS — E785 Hyperlipidemia, unspecified: Secondary | ICD-10-CM | POA: Diagnosis present

## 2022-10-10 DIAGNOSIS — M545 Low back pain, unspecified: Secondary | ICD-10-CM | POA: Diagnosis present

## 2022-10-10 DIAGNOSIS — Z79899 Other long term (current) drug therapy: Secondary | ICD-10-CM | POA: Diagnosis not present

## 2022-10-10 DIAGNOSIS — Z8249 Family history of ischemic heart disease and other diseases of the circulatory system: Secondary | ICD-10-CM | POA: Diagnosis not present

## 2022-10-10 DIAGNOSIS — F10129 Alcohol abuse with intoxication, unspecified: Secondary | ICD-10-CM | POA: Diagnosis present

## 2022-10-10 DIAGNOSIS — I4891 Unspecified atrial fibrillation: Secondary | ICD-10-CM

## 2022-10-10 DIAGNOSIS — Z720 Tobacco use: Secondary | ICD-10-CM | POA: Diagnosis not present

## 2022-10-10 DIAGNOSIS — Z5900 Homelessness unspecified: Secondary | ICD-10-CM | POA: Diagnosis not present

## 2022-10-10 DIAGNOSIS — Z85828 Personal history of other malignant neoplasm of skin: Secondary | ICD-10-CM | POA: Diagnosis not present

## 2022-10-10 DIAGNOSIS — I110 Hypertensive heart disease with heart failure: Secondary | ICD-10-CM | POA: Diagnosis present

## 2022-10-10 DIAGNOSIS — Z820 Family history of epilepsy and other diseases of the nervous system: Secondary | ICD-10-CM | POA: Diagnosis not present

## 2022-10-10 DIAGNOSIS — G929 Unspecified toxic encephalopathy: Secondary | ICD-10-CM | POA: Diagnosis present

## 2022-10-10 DIAGNOSIS — I5032 Chronic diastolic (congestive) heart failure: Secondary | ICD-10-CM | POA: Diagnosis present

## 2022-10-10 DIAGNOSIS — Y900 Blood alcohol level of less than 20 mg/100 ml: Secondary | ICD-10-CM | POA: Diagnosis present

## 2022-10-10 DIAGNOSIS — R32 Unspecified urinary incontinence: Secondary | ICD-10-CM | POA: Diagnosis present

## 2022-10-10 DIAGNOSIS — N401 Enlarged prostate with lower urinary tract symptoms: Secondary | ICD-10-CM | POA: Diagnosis present

## 2022-10-10 DIAGNOSIS — E876 Hypokalemia: Secondary | ICD-10-CM | POA: Diagnosis present

## 2022-10-10 DIAGNOSIS — Z809 Family history of malignant neoplasm, unspecified: Secondary | ICD-10-CM | POA: Diagnosis not present

## 2022-10-10 DIAGNOSIS — F1010 Alcohol abuse, uncomplicated: Secondary | ICD-10-CM | POA: Diagnosis present

## 2022-10-10 DIAGNOSIS — Z86711 Personal history of pulmonary embolism: Secondary | ICD-10-CM | POA: Diagnosis not present

## 2022-10-10 DIAGNOSIS — Z91048 Other nonmedicinal substance allergy status: Secondary | ICD-10-CM | POA: Diagnosis not present

## 2022-10-10 DIAGNOSIS — Z91148 Patient's other noncompliance with medication regimen for other reason: Secondary | ICD-10-CM | POA: Diagnosis not present

## 2022-10-10 DIAGNOSIS — G8929 Other chronic pain: Secondary | ICD-10-CM | POA: Diagnosis present

## 2022-10-10 DIAGNOSIS — Z7951 Long term (current) use of inhaled steroids: Secondary | ICD-10-CM | POA: Diagnosis not present

## 2022-10-10 DIAGNOSIS — Z823 Family history of stroke: Secondary | ICD-10-CM | POA: Diagnosis not present

## 2022-10-10 LAB — COMPREHENSIVE METABOLIC PANEL
ALT: 12 U/L (ref 0–44)
AST: 20 U/L (ref 15–41)
Albumin: 2.8 g/dL — ABNORMAL LOW (ref 3.5–5.0)
Alkaline Phosphatase: 64 U/L (ref 38–126)
Anion gap: 10 (ref 5–15)
BUN: 7 mg/dL — ABNORMAL LOW (ref 8–23)
CO2: 18 mmol/L — ABNORMAL LOW (ref 22–32)
Calcium: 8 mg/dL — ABNORMAL LOW (ref 8.9–10.3)
Chloride: 107 mmol/L (ref 98–111)
Creatinine, Ser: 0.39 mg/dL — ABNORMAL LOW (ref 0.61–1.24)
GFR, Estimated: >60 mL/min (ref >60)
Glucose, Bld: 72 mg/dL (ref 70–99)
Potassium: 2.8 mmol/L — ABNORMAL LOW (ref 3.5–5.1)
Sodium: 135 mmol/L (ref 135–145)
Total Bilirubin: 0.5 mg/dL (ref 0.3–1.2)
Total Protein: 6.5 g/dL (ref 6.5–8.1)

## 2022-10-10 LAB — CBC WITH DIFFERENTIAL/PLATELET
Abs Immature Granulocytes: 0.03 10*3/uL (ref 0.00–0.07)
Basophils Absolute: 0 10*3/uL (ref 0.0–0.1)
Basophils Relative: 0 %
Eosinophils Absolute: 0.1 10*3/uL (ref 0.0–0.5)
Eosinophils Relative: 2 %
HCT: 28.7 % — ABNORMAL LOW (ref 39.0–52.0)
Hemoglobin: 9.2 g/dL — ABNORMAL LOW (ref 13.0–17.0)
Immature Granulocytes: 1 %
Lymphocytes Relative: 27 %
Lymphs Abs: 1.5 10*3/uL (ref 0.7–4.0)
MCH: 33.5 pg (ref 26.0–34.0)
MCHC: 32.1 g/dL (ref 30.0–36.0)
MCV: 104.4 fL — ABNORMAL HIGH (ref 80.0–100.0)
Monocytes Absolute: 0.3 10*3/uL (ref 0.1–1.0)
Monocytes Relative: 6 %
Neutro Abs: 3.5 10*3/uL (ref 1.7–7.7)
Neutrophils Relative %: 64 %
Platelets: 144 10*3/uL — ABNORMAL LOW (ref 150–400)
RBC: 2.75 MIL/uL — ABNORMAL LOW (ref 4.22–5.81)
RDW: 14.7 % (ref 11.5–15.5)
WBC: 5.5 10*3/uL (ref 4.0–10.5)
nRBC: 0 % (ref 0.0–0.2)

## 2022-10-10 LAB — MAGNESIUM: Magnesium: 1 mg/dL — ABNORMAL LOW (ref 1.7–2.4)

## 2022-10-10 LAB — ETHANOL: Alcohol, Ethyl (B): 24 mg/dL — ABNORMAL HIGH (ref <10)

## 2022-10-10 LAB — PHOSPHORUS: Phosphorus: 2.9 mg/dL (ref 2.5–4.6)

## 2022-10-10 MED ORDER — MAGNESIUM SULFATE 2 GM/50ML IV SOLN
2.0000 g | Freq: Once | INTRAVENOUS | Status: AC
  Administered 2022-10-10: 2 g via INTRAVENOUS
  Filled 2022-10-10: qty 50

## 2022-10-10 MED ORDER — RIVAROXABAN 20 MG PO TABS
20.0000 mg | ORAL_TABLET | Freq: Every day | ORAL | Status: DC
  Administered 2022-10-10 – 2022-10-12 (×3): 20 mg via ORAL
  Filled 2022-10-10 (×3): qty 1

## 2022-10-10 MED ORDER — THIAMINE HCL 100 MG/ML IJ SOLN
100.0000 mg | Freq: Every day | INTRAMUSCULAR | Status: DC
  Filled 2022-10-10: qty 2

## 2022-10-10 MED ORDER — SODIUM CHLORIDE 0.9 % IV BOLUS (SEPSIS)
1000.0000 mL | Freq: Once | INTRAVENOUS | Status: AC
  Administered 2022-10-10: 1000 mL via INTRAVENOUS

## 2022-10-10 MED ORDER — PROCHLORPERAZINE EDISYLATE 10 MG/2ML IJ SOLN: 10.0000 mg | Freq: Four times a day (QID) | INTRAMUSCULAR | Status: DC | PRN

## 2022-10-10 MED ORDER — SODIUM CHLORIDE 0.9 % IV SOLN
1000.0000 mL | INTRAVENOUS | Status: DC
  Administered 2022-10-10 – 2022-10-13 (×10): 1000 mL via INTRAVENOUS

## 2022-10-10 MED ORDER — FUROSEMIDE 20 MG PO TABS
20.0000 mg | ORAL_TABLET | Freq: Every day | ORAL | Status: DC
  Administered 2022-10-10 – 2022-10-13 (×4): 20 mg via ORAL
  Filled 2022-10-10 (×4): qty 1

## 2022-10-10 MED ORDER — LORAZEPAM 2 MG/ML IJ SOLN: 0.0000 mg | Freq: Two times a day (BID) | INTRAMUSCULAR | Status: DC

## 2022-10-10 MED ORDER — DILTIAZEM LOAD VIA INFUSION
15.0000 mg | Freq: Once | INTRAVENOUS | Status: AC
  Administered 2022-10-10: 15 mg via INTRAVENOUS
  Filled 2022-10-10: qty 15

## 2022-10-10 MED ORDER — FERROUS SULFATE 325 (65 FE) MG PO TABS
325.0000 mg | ORAL_TABLET | Freq: Every day | ORAL | Status: DC
  Administered 2022-10-11 – 2022-10-13 (×3): 325 mg via ORAL
  Filled 2022-10-10 (×3): qty 1

## 2022-10-10 MED ORDER — MAGNESIUM OXIDE -MG SUPPLEMENT 400 (240 MG) MG PO TABS
400.0000 mg | ORAL_TABLET | Freq: Two times a day (BID) | ORAL | Status: DC
  Administered 2022-10-11 (×2): 400 mg via ORAL
  Filled 2022-10-10 (×2): qty 1

## 2022-10-10 MED ORDER — MOMETASONE FURO-FORMOTEROL FUM 200-5 MCG/ACT IN AERO
2.0000 | INHALATION_SPRAY | Freq: Two times a day (BID) | RESPIRATORY_TRACT | Status: DC
  Administered 2022-10-10 – 2022-10-13 (×7): 2 via RESPIRATORY_TRACT
  Filled 2022-10-10: qty 8.8

## 2022-10-10 MED ORDER — LORAZEPAM 1 MG PO TABS: 0.0000 mg | ORAL_TABLET | Freq: Two times a day (BID) | ORAL | Status: DC

## 2022-10-10 MED ORDER — FAMOTIDINE 20 MG PO TABS
20.0000 mg | ORAL_TABLET | Freq: Every day | ORAL | Status: DC
  Administered 2022-10-10 – 2022-10-13 (×4): 20 mg via ORAL
  Filled 2022-10-10 (×4): qty 1

## 2022-10-10 MED ORDER — POTASSIUM CHLORIDE 10 MEQ/100ML IV SOLN
10.0000 meq | INTRAVENOUS | Status: AC
  Administered 2022-10-10 (×2): 10 meq via INTRAVENOUS
  Filled 2022-10-10 (×2): qty 100

## 2022-10-10 MED ORDER — ACETAMINOPHEN 650 MG RE SUPP: 650.0000 mg | Freq: Four times a day (QID) | RECTAL | Status: DC | PRN

## 2022-10-10 MED ORDER — THIAMINE MONONITRATE 100 MG PO TABS
100.0000 mg | ORAL_TABLET | Freq: Every day | ORAL | Status: DC
  Administered 2022-10-10 – 2022-10-13 (×4): 100 mg via ORAL
  Filled 2022-10-10 (×4): qty 1

## 2022-10-10 MED ORDER — ACETAMINOPHEN 325 MG PO TABS
650.0000 mg | ORAL_TABLET | Freq: Four times a day (QID) | ORAL | Status: DC | PRN
  Administered 2022-10-11 – 2022-10-13 (×3): 650 mg via ORAL
  Filled 2022-10-10 (×3): qty 2

## 2022-10-10 MED ORDER — AMOXICILLIN-POT CLAVULANATE 875-125 MG PO TABS: 1.0000 | ORAL_TABLET | Freq: Two times a day (BID) | ORAL | Status: DC

## 2022-10-10 MED ORDER — POTASSIUM CHLORIDE CRYS ER 20 MEQ PO TBCR
20.0000 meq | EXTENDED_RELEASE_TABLET | Freq: Every day | ORAL | Status: DC
  Administered 2022-10-10 – 2022-10-13 (×3): 20 meq via ORAL
  Filled 2022-10-10 (×3): qty 1

## 2022-10-10 MED ORDER — LOPERAMIDE HCL 2 MG PO CAPS
4.0000 mg | ORAL_CAPSULE | ORAL | Status: DC | PRN
  Administered 2022-10-11: 4 mg via ORAL
  Filled 2022-10-10: qty 2

## 2022-10-10 MED ORDER — POTASSIUM CHLORIDE CRYS ER 20 MEQ PO TBCR
40.0000 meq | EXTENDED_RELEASE_TABLET | Freq: Once | ORAL | Status: AC
  Administered 2022-10-10: 40 meq via ORAL
  Filled 2022-10-10: qty 2

## 2022-10-10 MED ORDER — DILTIAZEM HCL-DEXTROSE 125-5 MG/125ML-% IV SOLN (PREMIX)
5.0000 mg/h | INTRAVENOUS | Status: DC
  Administered 2022-10-10: 7.5 mg/h via INTRAVENOUS
  Administered 2022-10-10: 5 mg/h via INTRAVENOUS
  Filled 2022-10-10 (×4): qty 125

## 2022-10-10 MED ORDER — LORAZEPAM 1 MG PO TABS: 0.0000 mg | ORAL_TABLET | Freq: Four times a day (QID) | ORAL | Status: DC

## 2022-10-10 MED ORDER — SODIUM CHLORIDE 0.9 % IV BOLUS
1000.0000 mL | Freq: Once | INTRAVENOUS | Status: AC
  Administered 2022-10-10: 1000 mL via INTRAVENOUS

## 2022-10-10 MED ORDER — METOPROLOL TARTRATE 25 MG PO TABS
12.5000 mg | ORAL_TABLET | Freq: Two times a day (BID) | ORAL | Status: DC
  Administered 2022-10-10 – 2022-10-13 (×7): 12.5 mg via ORAL
  Filled 2022-10-10 (×7): qty 1

## 2022-10-10 MED ORDER — OXYCODONE HCL 5 MG PO TABS
5.0000 mg | ORAL_TABLET | ORAL | Status: DC | PRN
  Administered 2022-10-12 – 2022-10-13 (×4): 5 mg via ORAL
  Filled 2022-10-10 (×4): qty 1

## 2022-10-10 MED ORDER — LORAZEPAM 2 MG/ML IJ SOLN
0.0000 mg | Freq: Four times a day (QID) | INTRAMUSCULAR | Status: DC
  Administered 2022-10-11 (×2): 2 mg via INTRAVENOUS
  Filled 2022-10-10 (×3): qty 1

## 2022-10-10 NOTE — Progress Notes (Signed)
46M with h/o alcohol abuse and prior DTs, unprovoked PE, atrial fibrillation -noncompliant with Xarelto, HLD, CHF diastolic, anxiety & depression, HTN, and housing insecurity.  He was recently admitted 10/06/2022 -10/09/2022 with 5th rib fracture, pneumothorax and alcohol abuse.  In the ER patient found to have pneumothorax. Left thoracostomy tube placeed 10/06/22 by Dr. Bobbye Morton .  The pneumothorax resolved over course of 3 days.  Patient discharged yesterday.  Patient brought back to the ER for intoxication.  He was found to be in A-fib with RVR. Cardizem drip has been started.

## 2022-10-10 NOTE — ED Triage Notes (Signed)
Patient BIB EMS c/o alcohol intoxication. Per report patient was found stumbling in the side walk. Pt unable to answer how much he drink tonight. Pt hx of alcohol abuse.  CBG 110 BP 110/ 70 HR 80 RR 20  O2 sat 100% on RA

## 2022-10-10 NOTE — Progress Notes (Addendum)
Patient arrived onto unit from ED via bed. Moved himself from stretcher to bed without assist. A&Ox4 AFIB on telemetry.   Cardizem infusing at 7.5 mL/hr  - BP 118/85 HR 89.   Completed admission assessment. Skin CDI. Old right sided chest tube site with one stitch - CDI no drainage.

## 2022-10-10 NOTE — ED Notes (Signed)
ED TO INPATIENT HANDOFF REPORT  ED Nurse Name and Phone #: Clarise Cruz [phone]  S Name/Age/Gender Anthony [name] 67 y.o. male Room/Bed: WA10/WA10  Code Status   Code Status: Full Code  Home/SNF/Other  Patient oriented to: self, place, time, and situation Is this baseline? Yes   Triage Complete: Triage complete  Chief Complaint Atrial fibrillation with RVR ([place]) [I48.91]  Triage Note Patient BIB EMS c/o alcohol intoxication. Per report patient was found stumbling in the side walk. Pt unable to answer how much he drink tonight. Pt hx of alcohol abuse.  CBG 110 BP 110/ 70 HR 80 RR 20  O2 sat 100% on RA   Allergies Allergies  Allergen Reactions   Other Itching and Other (See Comments)    Seasonal allergies- Itchy eyes, runny nose, congestion   Poison Ivy Extract Rash    Level of Care/Admitting Diagnosis ED Disposition     ED Disposition  Admit   Condition  --   [place]: [place] [[id number]]  Level of Care: Progressive [102]  Admit to Progressive based on following criteria: CARDIOVASCULAR & THORACIC of moderate stability with acute coronary syndrome symptoms/low risk myocardial infarction/hypertensive urgency/arrhythmias/heart failure potentially compromising stability and stable post cardiovascular intervention patients.  May admit patient to [facility] or [facility] if equivalent level of care is available:: No  Covid Evaluation: Asymptomatic - no recent exposure (last 10 days) testing not required  Diagnosis: Atrial fibrillation with RVR [facility]) [id number]  Admitting Physician: Reubin Milan [id number]  Attending Physician: Reubin Milan [id number]  Certification:: I certify this patient will need inpatient services for at least 2 midnights  Estimated Length of Stay: 2          B Medical/Surgery History Past Medical History:  Diagnosis Date   Actinic keratosis 11/27/2016   Alcohol abuse with alcohol-induced mood  disorder ([place]) 07/18/2018   Alcohol use disorder, severe, dependence ([place]) 09/16/2006   05/22/2015 Argumentative, belligerent and verbally abusive to staff per his note from [facility])    Anxiety state 04/25/2018   Aortic atherosclerosis ([place]) 08/27/2022   Chronic low back pain 09/16/2006   Followed by NS.  Per his chart from [place], he fell off two storyhouse roof while cleaning his brother's gutters and sustained compression fracture of L3, L4 and L5 and fracture of right transverse process at L3 and nondisplaced fracture of left glenoid rim many years ago. He had multiple imaging in [place] including Lumbar MRI in 2016 which showed moderate to severe spinal canal stenos   Delirium tremens ([place]) 09/01/2017   Depression    Dry eye 11/23/2016   Foreign body in middle portion of esophagus 05/19/2022   Hiatal hernia 09/14/2016   Status Collis gastroplasty and Nissen's fundoplication on [id number] in [place]   History of delirium tremons 07/22/2020   DTs during 10/2016 08/2017. And 12/2017 admissions    History of pulmonary embolus (PE) 11/02/2016   Unprovoked 11/01/2016. Xarelto from 11/01/16 to 09/08/2017.   Hydrocele    Surgically corrected   Hypertension    Hypoalbuminemia due to protein-calorie malnutrition ([place]) 07/22/2020   Lumbar compression fracture (HCC)    Multiple rib fractures 07/22/2019   Left rib details XR: left ribs demonstrate multiple remote rib   Pancytopenia (HCC)    Rhabdomyolysis 07/22/2020   Skin cancer    exciced 2017   Tinea versicolor 06/08/2013   Tobacco use disorder 07/22/2020   Tooth infection 04/25/2018   Trigger  finger, acquired 11/18/2012   Ulnar tunnel syndrome of right wrist 11/08/2012   Past Surgical History:  Procedure Laterality Date   BIOPSY  05/21/2022   Procedure: BIOPSY;  Surgeon: [name], MD;  Location: [facility] ENDOSCOPY;  Service: Gastroenterology;;   CATARACT EXTRACTION     ESOPHAGOGASTRODUODENOSCOPY  (EGD) WITH PROPOFOL N/A 05/21/2022   Procedure: ESOPHAGOGASTRODUODENOSCOPY (EGD) WITH PROPOFOL;  Surgeon: [name], MD;  Location: [place];  Service: Gastroenterology;  Laterality: N/A;   FOREIGN BODY REMOVAL N/A 05/21/2022   Procedure: FOREIGN BODY REMOVAL;  Surgeon: [name], MD;  Location: [place];  Service: Gastroenterology;  Laterality: N/A;   HIATAL HERNIA REPAIR  2016   HYDROCELE EXCISION / REPAIR  2010   SKIN CANCER EXCISION Left    Excised 2017     A IV Location/Drains/Wounds Patient Lines/Drains/Airways Status     Active Line/Drains/Airways     Name Placement date Placement time Site Days   Peripheral IV 10/10/22 20 G Anterior;Left;Proximal Forearm 10/10/22  0036  Forearm  less than 1   Peripheral IV 10/10/22 22 G Posterior;Right Hand 10/10/22  0730  Hand  less than 1            Intake/Output Last 24 hours  Intake/Output Summary (Last 24 hours) at 10/10/2022 0814 Last data filed at 10/10/2022 0250 Gross per 24 hour  Intake 1000 ml  Output --  Net 1000 ml    Labs/Imaging Results for orders placed or performed during the hospital encounter of 10/09/22 (from the past 48 hour(s))  CBC with Differential     Status: Abnormal   Collection Time: 10/10/22  2:45 AM  Result Value Ref Range   WBC 5.5 4.0 - 10.5 K/uL   RBC 2.75 (L) 4.22 - 5.81 MIL/uL   Hemoglobin 9.2 (L) 13.0 - 17.0 g/dL   HCT 28.7 (L) 39.0 - 52.0 %   MCV 104.4 (H) 80.0 - 100.0 fL   MCH 33.5 26.0 - 34.0 pg   MCHC 32.1 30.0 - 36.0 g/dL   RDW 14.7 11.5 - 15.5 %   Platelets 144 (L) 150 - 400 K/uL   nRBC 0.0 0.0 - 0.2 %   Neutrophils Relative % 64 %   Neutro Abs 3.5 1.7 - 7.7 K/uL   Lymphocytes Relative 27 %   Lymphs Abs 1.5 0.7 - 4.0 K/uL   Monocytes Relative 6 %   Monocytes Absolute 0.3 0.1 - 1.0 K/uL   Eosinophils Relative 2 %   Eosinophils Absolute 0.1 0.0 - 0.5 K/uL   Basophils Relative 0 %   Basophils Absolute 0.0 0.0 - 0.1 K/uL   Immature Granulocytes 1 %   Abs Immature  Granulocytes 0.03 0.00 - 0.07 K/uL    Comment: Performed at [facility], [place] [address], [place], [place] [address]  Comprehensive metabolic panel     Status: Abnormal   Collection Time: 10/10/22  2:45 AM  Result Value Ref Range   Sodium 135 135 - 145 mmol/L   Potassium 2.8 (L) 3.5 - 5.1 mmol/L   Chloride 107 98 - 111 mmol/L   CO2 18 (L) 22 - 32 mmol/L   Glucose, Bld 72 70 - 99 mg/dL    Comment: Glucose reference range applies only to samples taken after fasting for at least 8 hours.   BUN 7 (L) 8 - 23 mg/dL   Creatinine, Ser 0.39 (L) 0.61 - 1.24 mg/dL   Calcium 8.0 (L) 8.9 - 10.3 mg/dL   Total Protein 6.5 6.5 -  8.1 g/dL   Albumin 2.8 (L) 3.5 - 5.0 g/dL   AST 20 15 - 41 U/L   ALT 12 0 - 44 U/L   Alkaline Phosphatase 64 38 - 126 U/L   Total Bilirubin 0.5 0.3 - 1.2 mg/dL   GFR, Estimated >60 >60 mL/min    Comment: (NOTE) Calculated using the CKD-EPI Creatinine Equation (2021)    Anion gap 10 5 - 15    Comment: Performed at [facility], [place] [address], [place], [place] [address]  Ethanol     Status: Abnormal   Collection Time: 10/10/22  2:45 AM  Result Value Ref Range   Alcohol, Ethyl (B) 24 (H) <10 mg/dL    Comment: (NOTE) Lowest detectable limit for serum alcohol is 10 mg/dL.  For medical purposes only. Performed at [facility], [place] [address], [place], [place] [address]    DG Chest Port 1 View  Result Date: 10/10/2022 CLINICAL DATA:  Tachycardia EXAM: PORTABLE CHEST 1 VIEW COMPARISON:  10/09/2022 FINDINGS: Bibasilar pulmonary infiltrates are present, progressive at the right lung base, in keeping with multifocal infection or aspiration. No pneumothorax or pleural effusion. Cardiac size within normal limits. Pulmonary vascularity is normal. No acute bone abnormality. IMPRESSION: 1. Progressive bibasilar pulmonary infiltrates, in keeping with multifocal infection or aspiration. Electronically Signed   By: Fidela Salisbury  M.D.   On: 10/10/2022 00:58   DG CHEST PORT 1 VIEW  Result Date: 10/09/2022 CLINICAL DATA:  Chest tube removal EXAM: PORTABLE CHEST 1 VIEW COMPARISON:  10/09/2022, 10/08/2022, 10/07/2022, chest CT 10/06/2022 FINDINGS: Right-sided central venous catheter tip at the cavoatrial region. Airspace disease at left base. Stable cardiomediastinal silhouette. Interim removal of right lower chest tube. Possible trace residual right apical pneumothorax. IMPRESSION: 1. Interim removal of right chest tube. Possible trace residual right apical pneumothorax, no significant change. 2. Persistent airspace disease at the left greater than right lung base. Electronically Signed   By: Donavan Foil M.D.   On: 10/09/2022 16:11   DG CHEST PORT 1 VIEW  Result Date: 10/09/2022 CLINICAL DATA:  Pneumothorax, right EXAM: PORTABLE CHEST 1 VIEW COMPARISON:  Chest x-ray October 08, 2022. FINDINGS: Suspect small residual right apical pneumothorax. Right chest tube remains in place. Similar bibasilar opacities. Similar position of a right-sided central venous catheter with the tip at the right atrium. Polyarticular degenerative change. IMPRESSION: 1. Suspect small residual right apical pneumothorax. Right chest tube remains in place. 2. Similar bibasilar opacities. Electronically Signed   By: [name] M.D.   On: 10/09/2022 09:09    Pending Labs Unresulted Labs (From admission, onward)     Start     Ordered   10/11/22 0500  CBC  Tomorrow morning,   R        10/10/22 0756   10/11/22 0500  Comprehensive metabolic panel  Tomorrow morning,   R        10/10/22 0756   10/10/22 0758  Magnesium  Add-on,   AD        10/10/22 0757   10/10/22 0758  Phosphorus  Add-on,   AD        10/10/22 0757            Vitals/Pain Today's Vitals   10/10/22 0600 10/10/22 0701 10/10/22 0702 10/10/22 0800  BP: 101/66 113/80 113/80 115/78  Pulse: 89 78 (!) 108 96  Resp:  16  15  Temp:      TempSrc:      SpO2:  99% 97%  100%  Weight:       Height:      PainSc:  0-No pain      Isolation Precautions No active isolations  Medications Medications  sodium chloride 0.9 % bolus 1,000 mL (0 mLs Intravenous Stopped 10/10/22 0250)    Followed by  0.9 %  sodium chloride infusion (1,000 mLs Intravenous New Bag/Given 10/10/22 0039)  diltiazem (CARDIZEM) 1 mg/mL load via infusion 15 mg (15 mg Intravenous Bolus from Bag 10/10/22 0038)    And  diltiazem (CARDIZEM) 125 mg in dextrose 5% 125 mL (1 mg/mL) infusion (7.5 mg/hr Intravenous Rate/Dose Change 10/10/22 0705)  LORazepam (ATIVAN) injection 0-4 mg ( Intravenous See Alternative 10/10/22 0706)    Or  LORazepam (ATIVAN) tablet 0-4 mg ( Oral Not Given 10/10/22 0706)  LORazepam (ATIVAN) injection 0-4 mg (has no administration in time range)    Or  LORazepam (ATIVAN) tablet 0-4 mg (has no administration in time range)  thiamine (VITAMIN B1) tablet 100 mg (has no administration in time range)    Or  thiamine (VITAMIN B1) injection 100 mg (has no administration in time range)  potassium chloride 10 mEq in 100 mL IVPB (10 mEq Intravenous New Bag/Given 10/10/22 0733)  acetaminophen (TYLENOL) tablet 650 mg (has no administration in time range)    Or  acetaminophen (TYLENOL) suppository 650 mg (has no administration in time range)  prochlorperazine (COMPAZINE) injection 10 mg (has no administration in time range)  potassium chloride SA (KLOR-CON M) CR tablet 40 mEq (has no administration in time range)  magnesium sulfate IVPB 2 g 50 mL (has no administration in time range)  sodium chloride 0.9 % bolus 1,000 mL (1,000 mLs Intravenous New Bag/Given 10/10/22 0734)    Mobility walks     Focused Assessments    R Recommendations: See Admitting Provider Note  Report given to:   Additional Notes:

## 2022-10-10 NOTE — H&P (Signed)
History and Physical    Patient: Anthony [name] [id number] DOB: 1956-03-10 DOA: 10/09/2022 DOS: the patient was seen and examined on 10/10/2022 PCP: Pcp, No  Patient coming from: Home  Chief Complaint:  Chief Complaint  Patient presents with   Alcohol Intoxication   HPI: Anthony [name] is a 67 y.o. 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  He did not specify how much he was drinking.  He has not been compliant with his medications. He denied fever, chills, rhinorrhea, sore throat, wheezing or hemoptysis.  Positive chest wall pain, but denied palpitations, diaphoresis, PND, orthopnea or pitting edema of the lower extremities.  No abdominal pain, nausea, emesis, diarrhea, constipation, melena or hematochezia.  No flank pain, dysuria, frequency or hematuria.  No polyuria, polydipsia, polyphagia or blurred vision.   Lab work: CBC showed a white count 5.5, hemoglobin 9.2 g/deciliter platelets 144.  Magnesium 1.0, phosphorus 2.9 and alcohol 24 mg/dL.  CMP showed potassium of 2.8 and CO2 of 18 mmol/L with a normal anion gap.  Normal sodium, chloride glucose and calcium after correction.  BUN was 7 and creatinine 0.39 mg/dL.  LFTs were normal SF4 and albumin level 2.8 g/dL.  Imaging: Portable 1 view chest radiograph with progressive bibasilar pulmonary infiltrates, in keeping with multifocal infection  or aspiration.  ED course: Initial vital signs were temperature 97.6 F, pulse 138, respiration 17, BP 151/105 mmHg O2 sat 100% on room air.  The patient received 1000 mL of normal saline bolus, oral and parenteral K replacement, 15 mg of diltiazem IV followed by a continuous infusion.  He was also placed on CIWA protocol.  I added magnesium sulfate 2 g IVPB.   Review of Systems: As mentioned in the history of present illness. All other systems reviewed and are negative. Past Medical History:  Diagnosis Date   Actinic keratosis 11/27/2016   Alcohol abuse with alcohol-induced mood disorder ([place]) 07/18/2018   Alcohol use disorder, severe, dependence ([place]) 09/16/2006   05/22/2015 Argumentative, belligerent and verbally abusive to staff per his note from [facility])    Anxiety state 04/25/2018   Aortic atherosclerosis ([place]) 08/27/2022   Chronic low back pain 09/16/2006   Followed by NS.  Per his chart from [place], he fell off two storyhouse roof while cleaning his brother's gutters and sustained compression fracture of L3, L4 and L5 and fracture of right transverse process at L3 and nondisplaced fracture of left glenoid rim many years ago. He had multiple imaging in [place] including Lumbar MRI in 2016 which showed moderate to severe spinal canal stenos   Delirium tremens ([place]) 09/01/2017   Depression    Dry eye 11/23/2016   Foreign body in middle portion of esophagus 05/19/2022   Hiatal hernia 09/14/2016   Status Collis gastroplasty and Nissen's fundoplication on [id number] in [place]   History of delirium tremons 07/22/2020   DTs during 10/2016 08/2017. And 12/2017 admissions    History of pulmonary embolus (PE) 11/02/2016   Unprovoked 11/01/2016. Xarelto from  11/01/16 to 09/08/2017.   Hydrocele    Surgically corrected   Hypertension    Hypoalbuminemia due to protein-calorie malnutrition ([place]) 07/22/2020   Lumbar compression fracture (HCC)    Multiple  rib fractures 07/22/2019   Left rib details XR: left ribs demonstrate multiple remote rib   Pancytopenia ([place])    Rhabdomyolysis 07/22/2020   Skin cancer    exciced 2017   Tinea versicolor 06/08/2013   Tobacco use disorder 07/22/2020   Tooth infection 04/25/2018   Trigger finger, acquired 11/18/2012   Ulnar tunnel syndrome of right wrist 11/08/2012   Past Surgical History:  Procedure Laterality Date   BIOPSY  05/21/2022   Procedure: BIOPSY;  Surgeon: [name], MD;  Location: [facility] ENDOSCOPY;  Service: Gastroenterology;;   CATARACT EXTRACTION     ESOPHAGOGASTRODUODENOSCOPY (EGD) WITH PROPOFOL N/A 05/21/2022   Procedure: ESOPHAGOGASTRODUODENOSCOPY (EGD) WITH PROPOFOL;  Surgeon: [name], MD;  Location: [place];  Service: Gastroenterology;  Laterality: N/A;   FOREIGN BODY REMOVAL N/A 05/21/2022   Procedure: FOREIGN BODY REMOVAL;  Surgeon: [name], MD;  Location: [place];  Service: Gastroenterology;  Laterality: N/A;   HIATAL HERNIA REPAIR  2016   HYDROCELE EXCISION / REPAIR  2010   SKIN CANCER EXCISION Left    Excised 2017   Social History:  reports that he has never smoked. His smokeless tobacco use includes snuff. He reports current alcohol use of about 43.0 standard drinks of alcohol per week. He reports current drug use. Drug: Marijuana.  Allergies  Allergen Reactions   Other Itching and Other (See Comments)    Seasonal allergies- Itchy eyes, runny nose, congestion   Poison Ivy Extract Rash    Family History  Problem Relation Age of Onset   Heart attack Father        died at 56 years   Dementia Father    Stroke Father    Cancer Sister    Colon cancer Neg Hx    Colon polyps Neg Hx    Esophageal cancer Neg Hx    Stomach cancer Neg Hx    Rectal cancer Neg Hx     Prior to Admission medications   Medication Sig Start Date End Date Taking? Authorizing Provider  acetaminophen (TYLENOL) 500 MG tablet Take 2 tablets (1,000 mg total) by mouth every 6  (six) hours as needed. 10/09/22   Saverio Danker, PA-C  budesonide-formoterol [facility]) 160-4.5 MCG/ACT inhaler Inhale 2 puffs into the lungs 2 (two) times daily.    [provider]  cyanocobalamin 1000 MCG tablet Take 1 tablet (1,000 mcg total) by mouth daily. 09/18/22 10/18/22  Alma Friendly, MD  famotidine (PEPCID) 20 MG tablet Take 1 tablet (20 mg total) by mouth daily. 09/18/22 10/18/22  Alma Friendly, MD  ferrous sulfate 325 (65 FE) MG tablet Take 1 tablet (325 mg total) by mouth daily with breakfast. 09/18/22 10/18/22  Alma Friendly, MD  folic acid (FOLVITE) 1 MG tablet Take 1 tablet (1 mg total) by mouth daily. 09/06/22   Amin, Jeanella Flattery, MD  furosemide (LASIX) 20 MG tablet Take 1 tablet (20 mg total) by mouth daily. 10/01/22   Domenic Moras, PA-C  loperamide (IMODIUM) 2 MG capsule Take 2 capsules (4 mg total) by mouth as needed for diarrhea or loose stools. 10/01/22   Domenic Moras, PA-C  Magnesium Oxide 400 MG CAPS Take 1 capsule (400 mg total) by mouth 2 (two) times daily. 09/22/22   Karmen Bongo, MD  methocarbamol (ROBAXIN) 500 MG tablet  Take 1 tablet (500 mg total) by mouth every 6 (six) hours as needed for muscle spasms. 10/09/22   Saverio Danker, PA-C  metoprolol tartrate (LOPRESSOR) 25 MG tablet Take 1/2 tablet (12.5 mg total) by mouth 2 (two) times daily. 09/17/22 10/17/22  Alma Friendly, MD  oxyCODONE (OXY IR/ROXICODONE) 5 MG immediate release tablet Take 1 tablet (5 mg total) by mouth every 4 (four) hours as needed for moderate pain or severe pain. 10/09/22   Saverio Danker, PA-C  rivaroxaban (XARELTO) 20 MG TABS tablet Take 1 tablet (20 mg) by mouth daily with supper. 09/17/22 10/17/22  Alma Friendly, MD  rosuvastatin (CRESTOR) 10 MG tablet Take 1 tablet (10 mg total) by mouth daily. 09/05/22   Damita Lack, MD    Physical Exam: Vitals:   10/10/22 0505 10/10/22 0600 10/10/22 0701 10/10/22 0702  BP:  101/66 113/80 113/80  Pulse:  89 78 (!) 108   Resp:   16   Temp: 97.8 F (36.6 C)     TempSrc: Oral     SpO2:  99% 97%   Weight:      Height:       Physical Exam Vitals reviewed.  Constitutional:      General: He is awake. He is not in acute distress.    Appearance: He is normal weight. He is ill-appearing.  HENT:     Head: Normocephalic.     Mouth/Throat:     Mouth: Mucous membranes are moist.  Eyes:     General: No scleral icterus.    Pupils: Pupils are equal, round, and reactive to light.  Neck:     Vascular: No JVD.  Cardiovascular:     Rate and Rhythm: Normal rate. Rhythm irregular.     Heart sounds: S1 normal and S2 normal.  Pulmonary:     Effort: Pulmonary effort is normal.     Breath sounds: Examination of the right-lower field reveals decreased breath sounds. Examination of the left-lower field reveals decreased breath sounds. Decreased breath sounds present. No wheezing, rhonchi or rales.  Abdominal:     General: Bowel sounds are normal. There is no distension.     Palpations: Abdomen is soft.     Tenderness: There is no abdominal tenderness. There is no right CVA tenderness, left CVA tenderness or guarding.  Musculoskeletal:     Cervical back: Neck supple.     Right lower leg: No edema.     Left lower leg: No edema.  Skin:    General: Skin is warm and dry.  Neurological:     General: No focal deficit present.     Mental Status: He is alert and oriented to person, place, and time.  Psychiatric:        Mood and Affect: Mood normal.        Behavior: Behavior normal. Behavior is cooperative.     Data Reviewed:  Results are pending, will review when available.  Assessment and Plan: Principal Problem:   Atrial fibrillation with RVR (HCC) Observation/PCU. Resume DOAC. Continue diltiazem infusion. Continue beta-blocker twice daily. Keep electrolytes optimized. Check echocardiogram.  Active Problems:   Alcohol abuse CIWA protocol with lorazepam. Magnesium sulfate supplementation. Folate, MVI  and thiamine. Consult TOC team.    HTN (hypertension) Currently on diltiazem.  Resume metoprolol 12.5 mg p.o. twice daily. Monitor blood pressure and heart rate.    Hypomagnesemia Magnesium sulfate 2 g IVPB given. Follow-up magnesium level.    Dyslipidemia Hold statin due to alcohol abuse.  Tobacco use disorder Tobacco cessation. Nicotine replacement therapy as needed.    Aspiration pneumonia?  ([place]) Has chronic bibasilar infiltrate that have worsened. No fever or leukocytosis. Satting in the high 90s on room air. Will defer antibiotic therapy for now.     Advance Care Planning:   Code Status: Full Code   Consults:   Family Communication:   Severity of Illness: The appropriate patient status for this patient is INPATIENT. Inpatient status is judged to be reasonable and necessary in order to provide the required intensity of service to ensure the patient's safety. The patient's presenting symptoms, physical exam findings, and initial radiographic and laboratory data in the context of their chronic comorbidities is felt to place them at high risk for further clinical deterioration. Furthermore, it is not anticipated that the patient will be medically stable for discharge from the hospital within 2 midnights of admission.   * I certify that at the point of admission it is my clinical judgment that the patient will require inpatient hospital care spanning beyond 2 midnights from the point of admission due to high intensity of service, high risk for further deterioration and high frequency of surveillance required.*  Author: Reubin Milan, MD 10/10/2022 7:53 AM  For on call review [email or web address].   This document was prepared using Dragon voice recognition software and may contain some unintended transcription errors.

## 2022-10-10 NOTE — ED Provider Notes (Signed)
[place] EMERGENCY DEPARTMENT AT [facility] Provider Note  CSN: [id number] Arrival date & time: 10/09/22 2351  Chief Complaint(s) Alcohol Intoxication  HPI Anthony [name] is a 67 y.o. male with a past medical history listed below including alcohol use disorder.  Patient was recently admitted for right pneumothorax related to rib fracture.  He was discharged this afternoon and apparently began drinking shortly thereafter.  Patient was found walking in and stumbling on the sidewalk.  He was brought in by EMS.  He denies any physical complaints.  Noted to be tachycardic into the 140s.  HPI  Past Medical History Past Medical History:  Diagnosis Date   Actinic keratosis 11/27/2016   Alcohol abuse with alcohol-induced mood disorder ([place]) 07/18/2018   Alcohol use disorder, severe, dependence ([place]) 09/16/2006   05/22/2015 Argumentative, belligerent and verbally abusive to staff per his note from [facility])    Anxiety state 04/25/2018   Aortic atherosclerosis ([place]) 08/27/2022   Chronic low back pain 09/16/2006   Followed by NS.  Per his chart from [place], he fell off two storyhouse roof while cleaning his brother's gutters and sustained compression fracture of L3, L4 and L5 and fracture of right transverse process at L3 and nondisplaced fracture of left glenoid rim many years ago. He had multiple imaging in [place] including Lumbar MRI in 2016 which showed moderate to severe spinal canal stenos   Delirium tremens ([place]) 09/01/2017   Depression    Dry eye 11/23/2016   Foreign body in middle portion of esophagus 05/19/2022   Hiatal hernia 09/14/2016   Status Collis gastroplasty and Nissen's fundoplication on [id number] in [place]   History of delirium tremons 07/22/2020   DTs during 10/2016 08/2017. And 12/2017 admissions    History of pulmonary embolus (PE) 11/02/2016   Unprovoked 11/01/2016. Xarelto from 11/01/16 to 09/08/2017.   Hydrocele     Surgically corrected   Hypertension    Hypoalbuminemia due to protein-calorie malnutrition ([place]) 07/22/2020   Lumbar compression fracture (HCC)    Multiple rib fractures 07/22/2019   Left rib details XR: left ribs demonstrate multiple remote rib   Pancytopenia ([place])    Rhabdomyolysis 07/22/2020   Skin cancer    exciced 2017   Tinea versicolor 06/08/2013   Tobacco use disorder 07/22/2020   Tooth infection 04/25/2018   Trigger finger, acquired 11/18/2012   Ulnar tunnel syndrome of right wrist 11/08/2012   Patient Active Problem List   Diagnosis Date Noted   Traumatic pneumothorax, initial encounter 10/06/2022   SIRS (systemic inflammatory response syndrome) ([place]) 09/21/2022   Homelessness 09/21/2022   Atrial fibrillation ([name]) 09/12/2022   Moderate protein malnutrition ([place]) 09/11/2022   Chronic diarrhea 09/11/2022   C. difficile colitis 09/11/2022   Aspiration pneumonia ([place]) 08/27/2022   Aortic atherosclerosis ([place]) 08/27/2022   Depression, unspecified 08/09/2022   Hyponatremia 06/01/2022   Non compliance w medication regimen 05/31/2022   Delirium 05/25/2022   Stricture and stenosis of esophagus 05/21/2022   Gastritis and gastroduodenitis 05/21/2022   Loose stools 05/20/2022   Malnutrition of moderate degree 05/20/2022   Acute on chronic congestive heart failure ([place])    Leukocytosis 05/19/2022   Sepsis ([place]) 05/19/2022   Cellulitis of left leg, possible 05/19/2022    Class: Question of   Moniliasis, interdigital 05/19/2022   Open toe wound, right foot, medial fourth digit 05/19/2022   Malnutrition ([place]) 05/19/2022   Alcohol abuse 03/24/2022   Dyslipidemia 03/24/2022   Acute on chronic diastolic CHF (  congestive heart failure) ([place]) 03/24/2022   Acute respiratory failure with hypoxia (HCC) 03/23/2022   Generalized weakness    Atrial fibrillation with RVR ([place]) 03/15/2022   Hypokalemia 03/15/2022   Iron deficiency anemia 10/16/2020   Cough    Hypomagnesemia     Protein-calorie malnutrition, severe 08/22/2020   Alcohol use disorder, severe, in controlled environment ([place])    Pressure injury of skin 08/18/2020   Urinary tract infection without hematuria    Severe sepsis ([place]) 08/14/2020   Paroxysmal atrial fibrillation ([place]) 08/13/2020   Rhabdomyolysis 07/22/2020   Cerebral ventriculomegaly 07/22/2020   Hemoglobin decreased 07/22/2020   History of delirium tremons 07/22/2020   Hypoalbuminemia due to protein-calorie malnutrition ([place]) 07/22/2020   Tobacco use disorder 07/22/2020   Alcohol withdrawal delirium ([place])    Dehydration    Alcohol abuse with alcohol-induced mood disorder ([place]) 07/18/2018   Depression 05/26/2018   Anxiety state 04/25/2018   Need for immunization against influenza 04/25/2018   Alcohol withdrawal ([place]) 09/01/2017   DOE (dyspnea on exertion) 11/27/2016   Actinic keratosis 11/27/2016   Macrocytic anemia 11/23/2016   History of pulmonary embolus (PE) 11/02/2016   Hiatal hernia 09/14/2016   Skin cancer 08/13/2016   Poor social situation 06/09/2013   Tinea versicolor 06/08/2013   Back pain 05/07/2013   Seborrheic dermatitis of scalp 11/08/2012   HTN (hypertension) 04/11/2012   Alcohol use disorder, severe, dependence ([place]) 09/16/2006   Home Medication(s) Prior to Admission medications   Medication Sig Start Date End Date Taking? Authorizing Provider  acetaminophen (TYLENOL) 500 MG tablet Take 2 tablets (1,000 mg total) by mouth every 6 (six) hours as needed. 10/09/22   Saverio Danker, PA-C  budesonide-formoterol [facility]) 160-4.5 MCG/ACT inhaler Inhale 2 puffs into the lungs 2 (two) times daily.    [provider]  cyanocobalamin 1000 MCG tablet Take 1 tablet (1,000 mcg total) by mouth daily. 09/18/22 10/18/22  Alma Friendly, MD  famotidine (PEPCID) 20 MG tablet Take 1 tablet (20 mg total) by mouth daily. 09/18/22 10/18/22  Alma Friendly, MD  ferrous sulfate 325 (65 FE) MG tablet Take 1 tablet (325 mg  total) by mouth daily with breakfast. 09/18/22 10/18/22  Alma Friendly, MD  folic acid (FOLVITE) 1 MG tablet Take 1 tablet (1 mg total) by mouth daily. 09/06/22   Amin, Jeanella Flattery, MD  furosemide (LASIX) 20 MG tablet Take 1 tablet (20 mg total) by mouth daily. 10/01/22   Domenic Moras, PA-C  loperamide (IMODIUM) 2 MG capsule Take 2 capsules (4 mg total) by mouth as needed for diarrhea or loose stools. 10/01/22   Domenic Moras, PA-C  Magnesium Oxide 400 MG CAPS Take 1 capsule (400 mg total) by mouth 2 (two) times daily. 09/22/22   Karmen Bongo, MD  methocarbamol (ROBAXIN) 500 MG tablet Take 1 tablet (500 mg total) by mouth every 6 (six) hours as needed for muscle spasms. 10/09/22   Saverio Danker, PA-C  metoprolol tartrate (LOPRESSOR) 25 MG tablet Take 1/2 tablet (12.5 mg total) by mouth 2 (two) times daily. 09/17/22 10/17/22  Alma Friendly, MD  oxyCODONE (OXY IR/ROXICODONE) 5 MG immediate release tablet Take 1 tablet (5 mg total) by mouth every 4 (four) hours as needed for moderate pain or severe pain. 10/09/22   Saverio Danker, PA-C  rivaroxaban (XARELTO) 20 MG TABS tablet Take 1 tablet (20 mg) by mouth daily with supper. 09/17/22 10/17/22  Alma Friendly, MD  rosuvastatin (CRESTOR) 10 MG tablet Take 1 tablet (10 mg  total) by mouth daily. 09/05/22   Damita Lack, MD                                                                                                                                    Allergies Other and Poison ivy extract  Review of Systems Review of Systems As noted in HPI  Physical Exam Vital Signs  I have reviewed the triage vital signs BP 113/80   Pulse (!) 108   Temp 97.8 F (36.6 C) (Oral)   Resp 16   Ht 5\' 8"  (1.727 m)   Wt 68 kg   SpO2 97%   BMI 22.79 kg/m   Physical Exam Vitals reviewed.  Constitutional:      General: He is not in acute distress.    Appearance: He is well-developed. He is not diaphoretic.  HENT:     Head: Normocephalic and  atraumatic.     Nose: Nose normal.  Eyes:     General: No scleral icterus.       Right eye: No discharge.        Left eye: No discharge.     Conjunctiva/sclera: Conjunctivae normal.     Pupils: Pupils are equal, round, and reactive to light.  Cardiovascular:     Rate and Rhythm: Tachycardia present. Rhythm irregularly irregular.     Heart sounds: No murmur heard.    No friction rub. No gallop.  Pulmonary:     Effort: Pulmonary effort is normal. No respiratory distress.     Breath sounds: Normal breath sounds. No stridor. No rales.  Abdominal:     General: There is no distension.     Palpations: Abdomen is soft.     Tenderness: There is no abdominal tenderness.  Musculoskeletal:        General: No tenderness.     Cervical back: Normal range of motion and neck supple.  Skin:    General: Skin is warm and dry.     Findings: No erythema or rash.  Neurological:     Mental Status: He is alert and oriented to person, place, and time.     ED Results and Treatments Labs (all labs ordered are listed, but only abnormal results are displayed) Labs Reviewed  CBC WITH DIFFERENTIAL/PLATELET - Abnormal; Notable for the following components:      Result Value   RBC 2.75 (*)    Hemoglobin 9.2 (*)    HCT 28.7 (*)    MCV 104.4 (*)    Platelets 144 (*)    All other components within normal limits  COMPREHENSIVE METABOLIC PANEL - Abnormal; Notable for the following components:   Potassium 2.8 (*)    CO2 18 (*)    BUN 7 (*)    Creatinine, Ser 0.39 (*)    Calcium 8.0 (*)    Albumin 2.8 (*)    All other components within normal limits  ETHANOL - Abnormal;  Notable for the following components:   Alcohol, Ethyl (B) 24 (*)    All other components within normal limits                                                                                                                         EKG  EKG Interpretation  Date/Time:  Saturday October 10 2022 00:17:36 EDT Ventricular Rate:  137 PR  Interval:    QRS Duration: 77 QT Interval:  319 QTC Calculation: 482 R Axis:   79 Text Interpretation: Atrial fibrillation Borderline prolonged QT interval Confirmed by Addison Lank [phone]) on 10/10/2022 12:24:28 AM       Radiology DG Chest Port 1 View  Result Date: 10/10/2022 CLINICAL DATA:  Tachycardia EXAM: PORTABLE CHEST 1 VIEW COMPARISON:  10/09/2022 FINDINGS: Bibasilar pulmonary infiltrates are present, progressive at the right lung base, in keeping with multifocal infection or aspiration. No pneumothorax or pleural effusion. Cardiac size within normal limits. Pulmonary vascularity is normal. No acute bone abnormality. IMPRESSION: 1. Progressive bibasilar pulmonary infiltrates, in keeping with multifocal infection or aspiration. Electronically Signed   By: Fidela Salisbury M.D.   On: 10/10/2022 00:58   DG CHEST PORT 1 VIEW  Result Date: 10/09/2022 CLINICAL DATA:  Chest tube removal EXAM: PORTABLE CHEST 1 VIEW COMPARISON:  10/09/2022, 10/08/2022, 10/07/2022, chest CT 10/06/2022 FINDINGS: Right-sided central venous catheter tip at the cavoatrial region. Airspace disease at left base. Stable cardiomediastinal silhouette. Interim removal of right lower chest tube. Possible trace residual right apical pneumothorax. IMPRESSION: 1. Interim removal of right chest tube. Possible trace residual right apical pneumothorax, no significant change. 2. Persistent airspace disease at the left greater than right lung base. Electronically Signed   By: Donavan Foil M.D.   On: 10/09/2022 16:11   DG CHEST PORT 1 VIEW  Result Date: 10/09/2022 CLINICAL DATA:  Pneumothorax, right EXAM: PORTABLE CHEST 1 VIEW COMPARISON:  Chest x-ray October 08, 2022. FINDINGS: Suspect small residual right apical pneumothorax. Right chest tube remains in place. Similar bibasilar opacities. Similar position of a right-sided central venous catheter with the tip at the right atrium. Polyarticular degenerative change. IMPRESSION: 1.  Suspect small residual right apical pneumothorax. Right chest tube remains in place. 2. Similar bibasilar opacities. Electronically Signed   By: [name] M.D.   On: 10/09/2022 09:09    Medications Ordered in ED Medications  sodium chloride 0.9 % bolus 1,000 mL (0 mLs Intravenous Stopped 10/10/22 0250)    Followed by  0.9 %  sodium chloride infusion (1,000 mLs Intravenous New Bag/Given 10/10/22 0039)  diltiazem (CARDIZEM) 1 mg/mL load via infusion 15 mg (15 mg Intravenous Bolus from Bag 10/10/22 0038)    And  diltiazem (CARDIZEM) 125 mg in dextrose 5% 125 mL (1 mg/mL) infusion (7.5 mg/hr Intravenous Rate/Dose Change 10/10/22 0705)  LORazepam (ATIVAN) injection 0-4 mg ( Intravenous See Alternative 10/10/22 0706)    Or  LORazepam (ATIVAN) tablet 0-4 mg ( Oral Not Given 10/10/22 0706)  LORazepam (  ATIVAN) injection 0-4 mg (has no administration in time range)    Or  LORazepam (ATIVAN) tablet 0-4 mg (has no administration in time range)  thiamine (VITAMIN B1) tablet 100 mg (has no administration in time range)    Or  thiamine (VITAMIN B1) injection 100 mg (has no administration in time range)  potassium chloride 10 mEq in 100 mL IVPB (has no administration in time range)  sodium chloride 0.9 % bolus 1,000 mL (has no administration in time range)                                                                                                                                     Procedures .1-3 Lead EKG Interpretation  Performed by: Fatima Blank, MD Authorized by: Fatima Blank, MD     Interpretation: abnormal     ECG rate:  146   ECG rate assessment: tachycardic     Rhythm: atrial fibrillation     Ectopy: none     Conduction: normal   .Critical Care  Performed by: Fatima Blank, MD Authorized by: Fatima Blank, MD   Critical care provider statement:    Critical care time (minutes):  45   Critical care time was exclusive of:  Separately billable  procedures and treating other patients   Critical care was necessary to treat or prevent imminent or life-threatening deterioration of the following conditions:  Cardiac failure   Critical care was time spent personally by me on the following activities:  Development of treatment plan with patient or surrogate, discussions with consultants, evaluation of patient's response to treatment, examination of patient, obtaining history from patient or surrogate, review of old charts, re-evaluation of patient's condition, pulse oximetry, ordering and review of radiographic studies, ordering and review of laboratory studies and ordering and performing treatments and interventions   Care discussed with: admitting provider     (including critical care time)  Medical Decision Making / ED Course  Click here for ABCD2, HEART and other calculators  Medical Decision Making Amount and/or Complexity of Data Reviewed Labs: ordered. Radiology: ordered.  Risk OTC drugs. Prescription drug management. Decision regarding hospitalization.    Patient was brought in for alcohol intoxication and noted to be in A-fib RVR.  Given recent history of pneumothorax, will get a chest x-ray to ensure stability Will get labs to assess for any electrolyte or metabolic derangements. Patient is hemodynamically stable.  Started on Dilt bolus and drip.  Chest x-ray without evidence of pneumothorax or pneumonia. CBC without leukocytosis.  Stable hemoglobin. Metabolic panel with mild hypokalemia.  No other significant electrolyte derangements or renal insufficiency  K+repletion Admitted to medicine for AFRVR  CIWA for WD    Final Clinical Impression(s) / ED Diagnoses Final diagnoses:  Atrial fibrillation with RVR ([place])  ETOH abuse           This chart was dictated using voice recognition software.  Despite best efforts to proofread,  errors can occur which can change the documentation meaning.    Fatima Blank, MD 10/10/22 [phone]

## 2022-10-11 LAB — COMPREHENSIVE METABOLIC PANEL
ALT: 11 U/L (ref 0–44)
AST: 16 U/L (ref 15–41)
Albumin: 2.3 g/dL — ABNORMAL LOW (ref 3.5–5.0)
Alkaline Phosphatase: 62 U/L (ref 38–126)
Anion gap: 7 (ref 5–15)
BUN: 7 mg/dL — ABNORMAL LOW (ref 8–23)
CO2: 19 mmol/L — ABNORMAL LOW (ref 22–32)
Calcium: 7.8 mg/dL — ABNORMAL LOW (ref 8.9–10.3)
Chloride: 108 mmol/L (ref 98–111)
Creatinine, Ser: 0.45 mg/dL — ABNORMAL LOW (ref 0.61–1.24)
GFR, Estimated: >60 mL/min (ref >60)
Glucose, Bld: 93 mg/dL (ref 70–99)
Potassium: 3.6 mmol/L (ref 3.5–5.1)
Sodium: 134 mmol/L — ABNORMAL LOW (ref 135–145)
Total Bilirubin: 0.5 mg/dL (ref 0.3–1.2)
Total Protein: 5.8 g/dL — ABNORMAL LOW (ref 6.5–8.1)

## 2022-10-11 LAB — RAPID URINE DRUG SCREEN, HOSP PERFORMED
Amphetamines: NOT DETECTED
Barbiturates: POSITIVE — AB
Benzodiazepines: POSITIVE — AB
Cocaine: NOT DETECTED
Opiates: NOT DETECTED
Tetrahydrocannabinol: NOT DETECTED

## 2022-10-11 LAB — CBC
HCT: 25.3 % — ABNORMAL LOW (ref 39.0–52.0)
Hemoglobin: 8 g/dL — ABNORMAL LOW (ref 13.0–17.0)
MCH: 32.7 pg (ref 26.0–34.0)
MCHC: 31.6 g/dL (ref 30.0–36.0)
MCV: 103.3 fL — ABNORMAL HIGH (ref 80.0–100.0)
Platelets: 153 10*3/uL (ref 150–400)
RBC: 2.45 MIL/uL — ABNORMAL LOW (ref 4.22–5.81)
RDW: 15.1 % (ref 11.5–15.5)
WBC: 4 10*3/uL (ref 4.0–10.5)
nRBC: 0 % (ref 0.0–0.2)

## 2022-10-11 LAB — ETHANOL: Alcohol, Ethyl (B): <10 mg/dL (ref <10)

## 2022-10-11 LAB — MAGNESIUM: Magnesium: 1.1 mg/dL — ABNORMAL LOW (ref 1.7–2.4)

## 2022-10-11 MED ORDER — GUAIFENESIN-DM 100-10 MG/5ML PO SYRP
5.0000 mL | ORAL_SOLUTION | ORAL | Status: DC | PRN
  Administered 2022-10-11: 5 mL via ORAL
  Filled 2022-10-11: qty 10

## 2022-10-11 MED ORDER — DILTIAZEM HCL ER 60 MG PO CP12
60.0000 mg | ORAL_CAPSULE | Freq: Two times a day (BID) | ORAL | Status: DC
  Administered 2022-10-11 – 2022-10-13 (×5): 60 mg via ORAL
  Filled 2022-10-11 (×7): qty 1

## 2022-10-11 NOTE — Progress Notes (Signed)
Patient is drowsy during morning assessment. Spoke with Dr. Avon Gully at bedside during rounds. Ordered urine drug screen. Sample has been colleted and sent to lab. Awaiting results  Opened blinds and put up [facility] - patient more alert with morning medication administration but dosed off shortly after.

## 2022-10-11 NOTE — Progress Notes (Signed)
PROGRESS NOTE    Anthony [name]  [id number] DOB: 01-03-1956 DOA: 10/09/2022 PCP: Pcp, No   Brief Narrative:  Anthony [name] is a 67 y.o. male well-known to our hospital having been discharged just 24 hours prior to readmission.  He was recently admitted after fall with traumatic pneumothorax followed by trauma surgery and discharged on 10/10/2022.  Patient was found to be altered in public, was sent to the hospital via EMS for presumed alcohol intoxication. At admission patient's EtOH level was minimally elevated in 20s (recently 500 at last hospitalization) and is now within normal limits within 12 hours of admission.   Assessment & Plan:   Principal Problem:   Atrial fibrillation with RVR (HCC) Active Problems:   Alcohol abuse   HTN (hypertension)   Hypomagnesemia   Dyslipidemia   Tobacco use disorder   Aspiration pneumonia (HCC)  Acute symptomatic atrial fibrillation with RVR (HCC) Resume Xarelto. Wean off diltiazem drip, transition to p.o. diltiazem and metoprolol, currently rate controlled No indication for repeat echo, 08/28/2022 most recent shows EF 60-65% with indeterminate diastolic parameters  Presumed acute toxic encephalopathy -Patient was presumed to be intoxicated with alcohol per EMS report however EtOH level minimally elevated at 20 (for comparison last time patient was admitted for intoxication his alcohol level was over 500) -CIWA protocol discontinued, concern for polypharmacy over alcohol withdrawals at this point -Given his recent hospitalization he has been unable to obtain alcohol other than his 12-hour window between hospital admissions -UDS pending, concern for polypharmacy **update patient is positive for benzos and barbiturates (benzos have been administered since hospitalization but there are no records of barbiturate administration unless administered in the field and not relayed to ED staff) barbiturate use would explain patient's depressed mental status  exacerbated by benzo administration  EtOH abuse, chronic, well-documented Currently holding CIWA protocol given concern for polypharmacy and barbiturate use/overdose   HTN (hypertension) Improving Continue diltiazem/metoprolol, lisinopril   Hypomagnesemia Magnesium repleted   Dyslipidemia Hold statin due to alcohol abuse.   Tobacco use disorder Tobacco cessation. Nicotine replacement therapy as needed.   DVT prophylaxis: Xarelto Code Status: Full Family Communication: None present  Status is: Inpatient  Dispo: The patient is from: Homeless              Anticipated d/c is to: To be determined              Anticipated d/c date is: 24 to 48 hours              Patient currently not medically stable for discharge  Consultants:  None none  Procedures:  None  Antimicrobials:  None indicated  Subjective: Exam today is markedly limited as patient recently received benzodiazepines, he is able to orient to self only and follow very simple commands intermittently. ROS limited.  Objective: Vitals:   10/10/22 2026 10/10/22 2138 10/11/22 0026 10/11/22 0459  BP: (!) 129/95  128/88 (!) 123/90  Pulse: 81  81 80  Resp: 18  19 18   Temp: 99.5 F (37.5 C)  98.9 F (37.2 C) 98.1 F (36.7 C)  TempSrc: Oral  Oral Oral  SpO2: 97% 96% 100% 98%  Weight:      Height:        Intake/Output Summary (Last 24 hours) at 10/11/2022 0754 Last data filed at 10/11/2022 0300 Gross per 24 hour  Intake 4070.77 ml  Output 752 ml  Net 3318.77 ml   Filed Weights   10/10/22 0200 10/10/22 0900  Weight:  68 kg 66.7 kg    Examination:  General:  Pleasantly resting in bed, No acute distress. HEENT:  Normocephalic atraumatic.  Sclerae nonicteric, noninjected.  Extraocular movements intact bilaterally. Neck:  Without mass or deformity.  Trachea is midline. Lungs:  Clear to auscultate bilaterally without rhonchi, wheeze, or rales. Heart:  Regular rate and rhythm.  Without murmurs, rubs, or  gallops. Abdomen:  Soft, nontender, nondistended.  Without guarding or rebound. Extremities: Without cyanosis, clubbing, edema, or obvious deformity.  Data Reviewed: I have personally reviewed following labs and imaging studies  CBC: Recent Labs  Lab 10/06/22 0216 10/07/22 0916 10/09/22 0416 10/10/22 0245 10/11/22 0445  WBC 5.6 7.3 9.5 5.5 4.0  NEUTROABS 2.7  --   --  3.5  --   HGB 9.9* 10.5* 8.4* 9.2* 8.0*  HCT 30.7* 30.6* 26.7* 28.7* 25.3*  MCV 102.7* 98.7 104.7* 104.4* 103.3*  PLT 292 264 162 144* 0000000   Basic Metabolic Panel: Recent Labs  Lab 10/06/22 0216 10/07/22 0916 10/09/22 0416 10/10/22 0245 10/11/22 0445  NA 128* 131* 134* 135 134*  K 3.0* 3.6 3.3* 2.8* 3.6  CL 97* 100 106 107 108  CO2 13* 20* 20* 18* 19*  GLUCOSE 147* 91 105* 72 93  BUN 11 7* 8 7* 7*  CREATININE 0.88 0.60* 0.60* 0.39* 0.45*  CALCIUM 8.4* 8.1* 7.6* 8.0* 7.8*  MG  --   --   --  1.0* 1.1*  PHOS  --   --   --  2.9  --    GFR: Estimated Creatinine Clearance: 84.5 mL/min (A) (by C-G formula based on SCr of 0.45 mg/dL (L)). Liver Function Tests: Recent Labs  Lab 10/06/22 0216 10/10/22 0245 10/11/22 0445  AST 34 20 16  ALT 17 12 11   ALKPHOS 76 64 62  BILITOT 0.6 0.5 0.5  PROT 6.8 6.5 5.8*  ALBUMIN 3.0* 2.8* 2.3*   No results for input(s): "LIPASE", "AMYLASE" in the last 168 hours. No results for input(s): "AMMONIA" in the last 168 hours. Coagulation Profile: No results for input(s): "INR", "PROTIME" in the last 168 hours. Cardiac Enzymes: No results for input(s): "CKTOTAL", "CKMB", "CKMBINDEX", "TROPONINI" in the last 168 hours. BNP (last 3 results) No results for input(s): "PROBNP" in the last 8760 hours. HbA1C: No results for input(s): "HGBA1C" in the last 72 hours. CBG: No results for input(s): "GLUCAP" in the last 168 hours. Lipid Profile: No results for input(s): "CHOL", "HDL", "LDLCALC", "TRIG", "CHOLHDL", "LDLDIRECT" in the last 72 hours. Thyroid Function Tests: No  results for input(s): "TSH", "T4TOTAL", "FREET4", "T3FREE", "THYROIDAB" in the last 72 hours. Anemia Panel: No results for input(s): "VITAMINB12", "FOLATE", "FERRITIN", "TIBC", "IRON", "RETICCTPCT" in the last 72 hours. Sepsis Labs: No results for input(s): "PROCALCITON", "LATICACIDVEN" in the last 168 hours.  Recent Results (from the past 240 hour(s))  MRSA Next Gen by PCR, Nasal     Status: None   Collection Time: 10/08/22  8:51 PM   Specimen: Nasal Mucosa; Nasal Swab  Result Value Ref Range Status   MRSA by PCR Next Gen NOT DETECTED NOT DETECTED Final    Comment: (NOTE) The GeneXpert MRSA Assay (FDA approved for NASAL specimens only), is one component of a comprehensive MRSA colonization surveillance program. It is not intended to diagnose MRSA infection nor to guide or monitor treatment for MRSA infections. Test performance is not FDA approved in patients less than 73 years old. Performed at [facility], [place] [address] , [place] [address]  Radiology Studies: DG Chest Port 1 View  Result Date: 10/10/2022 CLINICAL DATA:  Tachycardia EXAM: PORTABLE CHEST 1 VIEW COMPARISON:  10/09/2022 FINDINGS: Bibasilar pulmonary infiltrates are present, progressive at the right lung base, in keeping with multifocal infection or aspiration. No pneumothorax or pleural effusion. Cardiac size within normal limits. Pulmonary vascularity is normal. No acute bone abnormality. IMPRESSION: 1. Progressive bibasilar pulmonary infiltrates, in keeping with multifocal infection or aspiration. Electronically Signed   By: Fidela Salisbury M.D.   On: 10/10/2022 00:58   DG CHEST PORT 1 VIEW  Result Date: 10/09/2022 CLINICAL DATA:  Chest tube removal EXAM: PORTABLE CHEST 1 VIEW COMPARISON:  10/09/2022, 10/08/2022, 10/07/2022, chest CT 10/06/2022 FINDINGS: Right-sided central venous catheter tip at the cavoatrial region. Airspace disease at left base. Stable cardiomediastinal silhouette. Interim  removal of right lower chest tube. Possible trace residual right apical pneumothorax. IMPRESSION: 1. Interim removal of right chest tube. Possible trace residual right apical pneumothorax, no significant change. 2. Persistent airspace disease at the left greater than right lung base. Electronically Signed   By: Donavan Foil M.D.   On: 10/09/2022 16:11   DG CHEST PORT 1 VIEW  Result Date: 10/09/2022 CLINICAL DATA:  Pneumothorax, right EXAM: PORTABLE CHEST 1 VIEW COMPARISON:  Chest x-ray October 08, 2022. FINDINGS: Suspect small residual right apical pneumothorax. Right chest tube remains in place. Similar bibasilar opacities. Similar position of a right-sided central venous catheter with the tip at the right atrium. Polyarticular degenerative change. IMPRESSION: 1. Suspect small residual right apical pneumothorax. Right chest tube remains in place. 2. Similar bibasilar opacities. Electronically Signed   By: [name] M.D.   On: 10/09/2022 09:09        Scheduled Meds:  famotidine  20 mg Oral Daily   ferrous sulfate  325 mg Oral Q breakfast   furosemide  20 mg Oral Daily   LORazepam  0-4 mg Intravenous Q6H   Or   LORazepam  0-4 mg Oral Q6H   [START ON 10/12/2022] LORazepam  0-4 mg Intravenous Q12H   Or   [START ON 10/12/2022] LORazepam  0-4 mg Oral Q12H   magnesium oxide  400 mg Oral BID   metoprolol tartrate  12.5 mg Oral BID   mometasone-formoterol  2 puff Inhalation BID   potassium chloride  20 mEq Oral Daily   rivaroxaban  20 mg Oral Q supper   thiamine  100 mg Oral Daily   Or   thiamine  100 mg Intravenous Daily   Continuous Infusions:  sodium chloride 1,000 mL (10/11/22 0130)   diltiazem (CARDIZEM) infusion 5 mg/hr (10/11/22 0123)    LOS: 1 day   Time spent: 62min  Narayan Scull C Aundria Bitterman, DO [facility]  If 7PM-7AM, please contact night-coverage [email or web address]  10/11/2022, 7:54 AM

## 2022-10-11 NOTE — Plan of Care (Signed)
  Problem: Cardiac: Goal: Ability to achieve and maintain adequate cardiopulmonary perfusion will improve Outcome: Progressing   Problem: Education: Goal: Knowledge of General Education information will improve Description: Including pain rating scale, medication(s)/side effects and non-pharmacologic comfort measures Outcome: Progressing   Problem: Activity: Goal: Risk for activity intolerance will decrease Outcome: Progressing   Problem: Elimination: Goal: Will not experience complications related to bowel motility Outcome: Progressing Goal: Will not experience complications related to urinary retention Outcome: Progressing

## 2022-10-12 LAB — COMPREHENSIVE METABOLIC PANEL
ALT: 11 U/L (ref 0–44)
AST: 17 U/L (ref 15–41)
Albumin: 2.4 g/dL — ABNORMAL LOW (ref 3.5–5.0)
Alkaline Phosphatase: 54 U/L (ref 38–126)
Anion gap: 10 (ref 5–15)
BUN: <5 mg/dL — ABNORMAL LOW (ref 8–23)
CO2: 20 mmol/L — ABNORMAL LOW (ref 22–32)
Calcium: 7.6 mg/dL — ABNORMAL LOW (ref 8.9–10.3)
Chloride: 106 mmol/L (ref 98–111)
Creatinine, Ser: 0.5 mg/dL — ABNORMAL LOW (ref 0.61–1.24)
GFR, Estimated: >60 mL/min (ref >60)
Glucose, Bld: 95 mg/dL (ref 70–99)
Potassium: 3.1 mmol/L — ABNORMAL LOW (ref 3.5–5.1)
Sodium: 136 mmol/L (ref 135–145)
Total Bilirubin: 0.4 mg/dL (ref 0.3–1.2)
Total Protein: 5.5 g/dL — ABNORMAL LOW (ref 6.5–8.1)

## 2022-10-12 LAB — MAGNESIUM: Magnesium: 1.1 mg/dL — ABNORMAL LOW (ref 1.7–2.4)

## 2022-10-12 MED ORDER — MAGNESIUM SULFATE 4 GM/100ML IV SOLN
4.0000 g | Freq: Once | INTRAVENOUS | Status: AC
  Administered 2022-10-12: 4 g via INTRAVENOUS
  Filled 2022-10-12: qty 100

## 2022-10-12 MED ORDER — POTASSIUM CHLORIDE CRYS ER 20 MEQ PO TBCR
40.0000 meq | EXTENDED_RELEASE_TABLET | ORAL | Status: AC
  Administered 2022-10-12 (×2): 40 meq via ORAL
  Filled 2022-10-12 (×2): qty 2

## 2022-10-12 NOTE — TOC Transition Note (Signed)
Transition of Care [facility]) - CM/SW Discharge Note   Patient Details  Name: Anthony [name] MRN: [id number] Date of Birth: 1955-08-04  Transition of Care [facility]) CM/SW Contact:  Phyllis Ginger, RN Phone Number: 10/12/2022, 1:23 PM   Clinical Narrative:  CM spoke with patient about resources, plans to go to [facility], has own bus pass, resources added to AVS for substance abuse, will await PT recommendation.      Final next level of care: Homeless Shelter Barriers to Discharge: No Barriers Identified   Patient Goals and CMS Choice CMS Medicare.gov Compare Post Acute Care list provided to:: Patient Choice offered to / list presented to : Patient  Discharge Placement                         Discharge Plan and Services Additional resources added to the After Visit Summary for     Discharge Planning Services: Other - See comment [facility]) Post Acute Care Choice: Resumption of Svcs/PTA Provider [facility])                               Social Determinants of Health (SDOH) Interventions SDOH Screenings   Food Insecurity: Food Insecurity Present (10/10/2022)  Housing: High Risk (10/10/2022)  Transportation Needs: Unmet Transportation Needs (10/10/2022)  Utilities: At Risk (10/10/2022)  Depression (PHQ2-9): Medium Risk (10/16/2020)  Tobacco Use: High Risk (10/10/2022)     Readmission Risk Interventions    08/28/2022    2:00 PM 08/27/2022   10:30 AM 08/27/2022   10:08 AM  Readmission Risk Prevention Plan  Transportation Screening Complete Complete   Medication Review ([place])  Complete   PCP or Specialist appointment within 3-5 days of discharge Complete Complete Complete  HRI or Home Care Consult  Complete Complete  SW Recovery Care/Counseling Consult Complete Complete Complete  Palliative Care Screening Not Applicable Not Applicable Complete  [place] Not Applicable Not Applicable Not Applicable

## 2022-10-12 NOTE — Progress Notes (Signed)
PROGRESS NOTE  Anthony [name] [id number] DOB: 03/07/1956 DOA: 10/09/2022 PCP: Pcp, No   LOS: 2 days   Brief Narrative / Interim history: Anthony [name] is a 67 y.o. male well-known to our hospital having been discharged just 24 hours prior to readmission.  He was recently admitted after fall with traumatic pneumothorax followed by trauma surgery and discharged on 10/10/2022.  Patient was found to be altered in public, was sent to the hospital via EMS for presumed alcohol intoxication. At admission patient's EtOH level was minimally elevated in 20s (recently 500 at last hospitalization) and is now within normal limits within 12 hours of admission.   Subjective / 24h Interval events: He is alert this morning, tells me that he is feeling quite weak.  Assesement and Plan: Principal Problem:   Atrial fibrillation with RVR (HCC) Active Problems:   Alcohol abuse   HTN (hypertension)   Hypomagnesemia   Dyslipidemia   Tobacco use disorder   Aspiration pneumonia (HCC)   Principal problem Acute symptomatic atrial fibrillation with RVR ([place]) -likely due to suspected nonadherence to home regimen.  He was placed on Xarelto, metoprolol, and has converted and is remaining in sinus rhythm this morning.  A 2D echo was recently done February 2024 which showed an EF of 60 to 65% with indeterminate diastolic parameters.   Active problems Presumed acute toxic encephalopathy -Patient was presumed to be intoxicated with alcohol per EMS report however EtOH level minimally elevated at 20 (for comparison last time patient was admitted for intoxication his alcohol level was over 500).  This appears resolved, he is alert and oriented x 4.  His UDS was positive for barbiturates as well as benzodiazepines  EtOH abuse, chronic, well-documented - Currently holding CIWA protocol given concern for polypharmacy and barbiturate use/overdose.  He wishes to speak with TOC regarding substance rehab access   HTN  (hypertension) - Improving, continue diltiazem/metoprolol, lisinopril   Hypomagnesemia, hypokalemia -persistently low this morning, continue to replete   Dyslipidemia - Hold statin due to alcohol abuse.   Tobacco use disorder - Tobacco cessation.  Scheduled Meds:  diltiazem  60 mg Oral Q12H   famotidine  20 mg Oral Daily   ferrous sulfate  325 mg Oral Q breakfast   furosemide  20 mg Oral Daily   metoprolol tartrate  12.5 mg Oral BID   mometasone-formoterol  2 puff Inhalation BID   potassium chloride  20 mEq Oral Daily   potassium chloride  40 mEq Oral Q3H   rivaroxaban  20 mg Oral Q supper   thiamine  100 mg Oral Daily   Or   thiamine  100 mg Intravenous Daily   Continuous Infusions:  sodium chloride 1,000 mL (10/12/22 0837)   PRN Meds:.acetaminophen **OR** acetaminophen, guaiFENesin-dextromethorphan, loperamide, oxyCODONE, prochlorperazine  Current Outpatient Medications  Medication Instructions   acetaminophen (TYLENOL) 1,000 mg, Oral, Every 6 hours PRN   budesonide-formoterol (SYMBICORT) 160-4.5 MCG/ACT inhaler 2 puffs, Inhalation, 2 times daily   cyanocobalamin 1,000 mcg, Oral, Daily   famotidine (PEPCID) 20 mg, Oral, Daily   ferrous sulfate 325 mg, Oral, Daily with breakfast   folic acid (FOLVITE) 1 mg, Oral, Daily   furosemide (LASIX) 20 mg, Oral, Daily   loperamide (IMODIUM) 4 mg, Oral, As needed   Magnesium Oxide 400 mg, Oral, 2 times daily   methocarbamol (ROBAXIN) 500 mg, Oral, Every 6 hours PRN   metoprolol tartrate (LOPRESSOR) 25 MG tablet Take 1/2 tablet (12.5 mg total) by mouth 2 (two) times  daily.   oxyCODONE (OXY IR/ROXICODONE) 5 mg, Oral, Every 4 hours PRN   rivaroxaban (XARELTO) 20 MG TABS tablet Take 1 tablet (20 mg) by mouth daily with supper.   rosuvastatin (CRESTOR) 10 mg, Oral, Daily    Diet Orders (From admission, onward)     Start     Ordered   10/10/22 0756  Diet Heart Room service appropriate? Yes; Fluid consistency: Thin  Diet effective now        Question Answer Comment  Room service appropriate? Yes   Fluid consistency: Thin      10/10/22 0756            DVT prophylaxis:  rivaroxaban (XARELTO) tablet 20 mg   Lab Results  Component Value Date   PLT 153 10/11/2022      Code Status: Full Code  Family Communication: no family at bedside   Status is: Inpatient  Remains inpatient appropriate because: severity of illness  Level of care: Progressive  Consultants:  none  Objective: Vitals:   10/11/22 1319 10/11/22 2103 10/12/22 0452 10/12/22 0843  BP: 116/80 126/81 127/84   Pulse: 81 90 79   Resp: 18 (!) 22 18   Temp: 98 F (36.7 C) 98.8 F (37.1 C) (!) 97.5 F (36.4 C)   TempSrc: Oral Oral Oral   SpO2: 96% 97% 98% 99%  Weight:      Height:        Intake/Output Summary (Last 24 hours) at 10/12/2022 1113 Last data filed at 10/12/2022 0831 Gross per 24 hour  Intake 2855.07 ml  Output 1600 ml  Net 1255.07 ml   Wt Readings from Last 3 Encounters:  10/10/22 66.7 kg  10/06/22 68 kg  09/17/22 62.2 kg    Examination:  Constitutional: NAD Eyes: no scleral icterus ENMT: Mucous membranes are moist.  Neck: normal, supple Respiratory: clear to auscultation bilaterally, no wheezing, no crackles.  Cardiovascular: Regular rate and rhythm, no murmurs / rubs / gallops. No LE edema.  Abdomen: non distended, no tenderness. Bowel sounds positive.  Musculoskeletal: no clubbing / cyanosis.   Data Reviewed: I have independently reviewed following labs and imaging studies   CBC Recent Labs  Lab 10/06/22 0216 10/07/22 0916 10/09/22 0416 10/10/22 0245 10/11/22 0445  WBC 5.6 7.3 9.5 5.5 4.0  HGB 9.9* 10.5* 8.4* 9.2* 8.0*  HCT 30.7* 30.6* 26.7* 28.7* 25.3*  PLT 292 264 162 144* 153  MCV 102.7* 98.7 104.7* 104.4* 103.3*  MCH 33.1 33.9 32.9 33.5 32.7  MCHC 32.2 34.3 31.5 32.1 31.6  RDW 15.1 14.9 14.8 14.7 15.1  LYMPHSABS 2.3  --   --  1.5  --   MONOABS 0.4  --   --  0.3  --   EOSABS 0.1  --   --  0.1   --   BASOSABS 0.0  --   --  0.0  --     Recent Labs  Lab 10/06/22 0216 10/07/22 0916 10/09/22 0416 10/10/22 0245 10/11/22 0445 10/12/22 0534  NA 128* 131* 134* 135 134* 136  K 3.0* 3.6 3.3* 2.8* 3.6 3.1*  CL 97* 100 106 107 108 106  CO2 13* 20* 20* 18* 19* 20*  GLUCOSE 147* 91 105* 72 93 95  BUN 11 7* 8 7* 7* <5*  CREATININE 0.88 0.60* 0.60* 0.39* 0.45* 0.50*  CALCIUM 8.4* 8.1* 7.6* 8.0* 7.8* 7.6*  AST 34  --   --  20 16 17   ALT 17  --   --  12  11 11  ALKPHOS 76  --   --  64 62 54  BILITOT 0.6  --   --  0.5 0.5 0.4  ALBUMIN 3.0*  --   --  2.8* 2.3* 2.4*  MG  --   --   --  1.0* 1.1* 1.1*  BNP 273.4*  --   --   --   --   --     ------------------------------------------------------------------------------------------------------------------ No results for input(s): "CHOL", "HDL", "LDLCALC", "TRIG", "CHOLHDL", "LDLDIRECT" in the last 72 hours.  Lab Results  Component Value Date   HGBA1C 5.2 11/01/2016   ------------------------------------------------------------------------------------------------------------------ No results for input(s): "TSH", "T4TOTAL", "T3FREE", "THYROIDAB" in the last 72 hours.  Invalid input(s): "FREET3"  Cardiac Enzymes No results for input(s): "CKMB", "TROPONINI", "MYOGLOBIN" in the last 168 hours.  Invalid input(s): "CK" ------------------------------------------------------------------------------------------------------------------    Component Value Date/Time   BNP 273.4 (H) 10/06/2022 0216    CBG: No results for input(s): "GLUCAP" in the last 168 hours.  Recent Results (from the past 240 hour(s))  MRSA Next Gen by PCR, Nasal     Status: None   Collection Time: 10/08/22  8:51 PM   Specimen: Nasal Mucosa; Nasal Swab  Result Value Ref Range Status   MRSA by PCR Next Gen NOT DETECTED NOT DETECTED Final    Comment: (NOTE) The GeneXpert MRSA Assay (FDA approved for NASAL specimens only), is one component of a comprehensive MRSA  colonization surveillance program. It is not intended to diagnose MRSA infection nor to guide or monitor treatment for MRSA infections. Test performance is not FDA approved in patients less than 38 years old. Performed at [facility], [place] [address], [place], [place] [address]      Radiology Studies: No results found.   Marzetta Board, MD, PhD [facility]  Between 7 am - 7 pm I am available, please contact me via Amion (for emergencies) or Securechat (non urgent messages)  Between 7 pm - 7 am I am not available, please contact night coverage MD/APP via Amion

## 2022-10-13 ENCOUNTER — Other Ambulatory Visit (HOSPITAL_COMMUNITY): Payer: Self-pay

## 2022-10-13 LAB — CBC
HCT: 25.6 % — ABNORMAL LOW (ref 39.0–52.0)
Hemoglobin: 8 g/dL — ABNORMAL LOW (ref 13.0–17.0)
MCH: 32.4 pg (ref 26.0–34.0)
MCHC: 31.3 g/dL (ref 30.0–36.0)
MCV: 103.6 fL — ABNORMAL HIGH (ref 80.0–100.0)
Platelets: 171 10*3/uL (ref 150–400)
RBC: 2.47 MIL/uL — ABNORMAL LOW (ref 4.22–5.81)
RDW: 14.9 % (ref 11.5–15.5)
WBC: 4.8 10*3/uL (ref 4.0–10.5)
nRBC: 0 % (ref 0.0–0.2)

## 2022-10-13 LAB — COMPREHENSIVE METABOLIC PANEL
ALT: 12 U/L (ref 0–44)
AST: 22 U/L (ref 15–41)
Albumin: 2.3 g/dL — ABNORMAL LOW (ref 3.5–5.0)
Alkaline Phosphatase: 56 U/L (ref 38–126)
Anion gap: 7 (ref 5–15)
BUN: 6 mg/dL — ABNORMAL LOW (ref 8–23)
CO2: 21 mmol/L — ABNORMAL LOW (ref 22–32)
Calcium: 8 mg/dL — ABNORMAL LOW (ref 8.9–10.3)
Chloride: 106 mmol/L (ref 98–111)
Creatinine, Ser: 0.37 mg/dL — ABNORMAL LOW (ref 0.61–1.24)
GFR, Estimated: >60 mL/min (ref >60)
Glucose, Bld: 86 mg/dL (ref 70–99)
Potassium: 4.3 mmol/L (ref 3.5–5.1)
Sodium: 134 mmol/L — ABNORMAL LOW (ref 135–145)
Total Bilirubin: 0.3 mg/dL (ref 0.3–1.2)
Total Protein: 5.7 g/dL — ABNORMAL LOW (ref 6.5–8.1)

## 2022-10-13 LAB — MAGNESIUM: Magnesium: 1.3 mg/dL — ABNORMAL LOW (ref 1.7–2.4)

## 2022-10-13 MED ORDER — METOPROLOL TARTRATE 25 MG PO TABS
12.5000 mg | ORAL_TABLET | Freq: Two times a day (BID) | ORAL | 0 refills | Status: DC
  Filled 2022-10-13: qty 30, 30d supply, fill #0

## 2022-10-13 MED ORDER — RIVAROXABAN 20 MG PO TABS
20.0000 mg | ORAL_TABLET | Freq: Every day | ORAL | 0 refills | Status: DC
  Filled 2022-10-13: qty 30, 30d supply, fill #0

## 2022-10-13 MED ORDER — BUDESONIDE-FORMOTEROL FUMARATE 160-4.5 MCG/ACT IN AERO
2.0000 | INHALATION_SPRAY | Freq: Two times a day (BID) | RESPIRATORY_TRACT | 0 refills | Status: DC
  Filled 2022-10-13: qty 10.2, 30d supply, fill #0

## 2022-10-13 MED ORDER — CYANOCOBALAMIN 1000 MCG PO TABS
1000.0000 ug | ORAL_TABLET | Freq: Every day | ORAL | 0 refills | Status: AC
  Filled 2022-10-13: qty 30, 30d supply, fill #0

## 2022-10-13 MED ORDER — DILTIAZEM HCL ER 60 MG PO CP12
60.0000 mg | ORAL_CAPSULE | Freq: Two times a day (BID) | ORAL | 0 refills | Status: DC
  Filled 2022-10-13: qty 40, 20d supply, fill #0

## 2022-10-13 MED ORDER — FERROUS SULFATE 325 (65 FE) MG PO TABS
325.0000 mg | ORAL_TABLET | Freq: Every day | ORAL | 0 refills | Status: DC
  Filled 2022-10-13: qty 30, 30d supply, fill #0

## 2022-10-13 MED ORDER — TAMSULOSIN HCL 0.4 MG PO CAPS
0.4000 mg | ORAL_CAPSULE | Freq: Every day | ORAL | 0 refills | Status: DC
  Filled 2022-10-13: qty 30, 30d supply, fill #0

## 2022-10-13 MED ORDER — MAGNESIUM OXIDE 400 MG PO CAPS
1.0000 | ORAL_CAPSULE | Freq: Two times a day (BID) | ORAL | 0 refills | Status: DC
  Filled 2022-10-13: qty 60, 30d supply, fill #0

## 2022-10-13 MED ORDER — ACETAMINOPHEN 500 MG PO TABS: 500.0000 mg | ORAL_TABLET | Freq: Three times a day (TID) | ORAL | 0 refills | Status: AC | PRN

## 2022-10-13 MED ORDER — FOLIC ACID 1 MG PO TABS
1.0000 mg | ORAL_TABLET | Freq: Every day | ORAL | 0 refills | Status: DC
  Filled 2022-10-13: qty 30, 30d supply, fill #0

## 2022-10-13 MED ORDER — ROSUVASTATIN CALCIUM 10 MG PO TABS
10.0000 mg | ORAL_TABLET | Freq: Every day | ORAL | 0 refills | Status: DC
  Filled 2022-10-13: qty 30, 30d supply, fill #0

## 2022-10-13 MED ORDER — MAGNESIUM SULFATE 4 GM/100ML IV SOLN
4.0000 g | Freq: Once | INTRAVENOUS | Status: AC
  Administered 2022-10-13: 4 g via INTRAVENOUS
  Filled 2022-10-13: qty 100

## 2022-10-13 MED ORDER — FUROSEMIDE 20 MG PO TABS
20.0000 mg | ORAL_TABLET | Freq: Every day | ORAL | 0 refills | Status: DC
  Filled 2022-10-13: qty 30, 30d supply, fill #0

## 2022-10-13 MED ORDER — ORAL CARE MOUTH RINSE: 15.0000 mL | OROMUCOSAL | Status: DC | PRN

## 2022-10-13 NOTE — Evaluation (Signed)
Physical Therapy Evaluation Patient Details Name: Anthony [name] MRN: [id number] DOB: [birth year] Today's Date: 10/13/2022  History of Present Illness  Trevious A Ducharme is a 67 y.o. male recently admitted after fall with traumatic pneumothorax followed by trauma surgery and discharged on 10/09/2022. Pt admitted 09/11/22 with afib with RVR. PMH: alcohol abuse, chronic low back pain, depression, HTN, pancytopenia, rhabdomyolysis, skin cancer, rib fractures  Clinical Impression  Pt admitted with above diagnosis. Pt doesn't elaborate regarding PLOF or discharge destination/plan, reports losing multiple RW and SPC due to using bus transportation and leaving them accidentally. Pt currently mobilizing into/out of bed with slightly increased time, modified ind. Pt completes STS transfers from bed and toilet with supv, ambulates 540 ft without AD, no overt LOB, min guard, does appear to be walking on gravel with choppy, flat foot steps, but reports this is his normal. Encouraged pt to amb to restroom and with nursing as able multiple times daily and pt verbalizes agreement. Pt currently with functional limitations due to the deficits listed below (see PT Problem List). Pt will benefit from acute skilled PT to increase their independence and safety with mobility to allow discharge.          Recommendations for follow up therapy are one component of a multi-disciplinary discharge planning process, led by the attending physician.  Recommendations may be updated based on patient status, additional functional criteria and insurance authorization.  Follow Up Recommendations       Assistance Recommended at Discharge PRN  Patient can return home with the following  Assist for transportation    Equipment Recommendations Cane  Recommendations for Other Services       Functional Status Assessment Patient has had a recent decline in their functional status and demonstrates the ability to make significant improvements in  function in a reasonable and predictable amount of time.     Precautions / Restrictions Precautions Precautions: Fall Restrictions Weight Bearing Restrictions: No      Mobility  Bed Mobility Overal bed mobility: Modified Independent  General bed mobility comments: slightly increased time    Transfers Overall transfer level: Needs assistance Equipment used: None Transfers: Sit to/from Stand Sit to Stand: Supervision  General transfer comment: supv with STS transfers from EOB and toilet, no AD    Ambulation/Gait Ambulation/Gait assistance: Min guard Gait Distance (Feet): 540 Feet Assistive device: None Gait Pattern/deviations: Step-through pattern, Decreased stride length, Drifts right/left Gait velocity: decreased  General Gait Details: step through gait pattern, flat foot posture with decreased heel-toe pattern, trunk slightly flexed, choppy step progression with some drifting R and L at times, no overt LOB, appears to be walking on gravel 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    Modified Rankin (Stroke Patients Only)       Balance Overall balance assessment: Needs assistance Sitting-balance support: Feet supported Sitting balance-Leahy Scale: Good  Standing balance support: No upper extremity supported, During functional activity Standing balance-Leahy Scale: Fair       Pertinent Vitals/Pain Pain Assessment Pain Assessment: Faces Faces Pain Scale: Hurts a [place] bit Pain Location: R flank "where my ribs are broken" Pain Descriptors / Indicators: Discomfort Pain Intervention(s): Limited activity within patient's tolerance, Monitored during session    Home Living Family/patient expects to be discharged to:: Shelter/Homeless Living Arrangements: Alone   Type of Home: Homeless  Home Equipment: None Additional Comments: pt reports "that's a loaded question" when asked  about home set up and discharge  destination    Prior Function Prior Level of Function : Independent/Modified Independent  Mobility Comments: pt reports not using DME, reports having lost DME, reports walking "slow"       Hand Dominance   Dominant Hand: Right    Extremity/Trunk Assessment   Upper Extremity Assessment Upper Extremity Assessment: Overall WFL for tasks assessed    Lower Extremity Assessment Lower Extremity Assessment: Generalized weakness (AROM WFL, strength grossly 4-/5, denies numbness/tingling throughout BLE)    Cervical / Trunk Assessment Cervical / Trunk Assessment: Kyphotic  Communication   Communication: No difficulties  Cognition Arousal/Alertness: Awake/alert Behavior During Therapy: WFL for tasks assessed/performed Overall Cognitive Status: No family/caregiver present to determine baseline cognitive functioning  General Comments: pt tangential at times, easy to redirect, conversational regarding previous jobs, states date is March 25, oriented to self and being in hospital        General Comments      Exercises     Assessment/Plan    PT Assessment Patient needs continued PT services  PT Problem List Decreased activity tolerance;Decreased balance;Decreased mobility;Decreased knowledge of use of DME;Pain;Decreased strength       PT Treatment Interventions DME instruction;Gait training;Stair training;Functional mobility training;Therapeutic activities;Therapeutic exercise;Balance training;Neuromuscular re-education;Patient/family education    PT Goals (Current goals can be found in the Care Plan section)  Acute Rehab PT Goals Patient Stated Goal: "let's go for a walk" PT Goal Formulation: With patient Time For Goal Achievement: 10/27/22 Potential to Achieve Goals: Good    Frequency Min 3X/week     Co-evaluation               AM-PAC PT "6 Clicks" Mobility  Outcome Measure Help needed turning from your back to your side while in a flat bed without using  bedrails?: None Help needed moving from lying on your back to sitting on the side of a flat bed without using bedrails?: None Help needed moving to and from a bed to a chair (including a wheelchair)?: A [place] Help needed standing up from a chair using your arms (e.g., wheelchair or bedside chair)?: A [place] Help needed to walk in hospital room?: A [place] Help needed climbing 3-5 steps with a railing? : A [place] 6 Click Score: 20    End of Session Equipment Utilized During Treatment: Gait belt Activity Tolerance: Patient tolerated treatment well Patient left: in bed;with call bell/phone within reach;with bed alarm set;with nursing/sitter in room Nurse Communication: Mobility status PT Visit Diagnosis: Difficulty in walking, not elsewhere classified (R26.2);Muscle weakness (generalized) (M62.81)    Time: [id number] PT Time Calculation (min) (ACUTE ONLY): 27 min   Charges:   PT Evaluation $PT Eval Low Complexity: 1 Low PT Treatments $Gait Training: 8-22 mins         Tori Jarvis Sawa PT, DPT 10/13/22, 10:35 AM

## 2022-10-13 NOTE — Discharge Summary (Addendum)
Physician Discharge Summary  Anthony [name] CE:[id number] DOB: February 15, 1956 DOA: 10/09/2022  PCP: Pcp, No  Admit date: 10/09/2022 Discharge date: 10/13/2022  Admitted From: home [facility]) Disposition:  home [facility])  Recommendations for Outpatient Follow-up:  Follow up with PCP in 1-2 weeks Please obtain BMP/CBC in one week Please follow up on the following pending results:  Home Health: none Equipment/Devices: none  Discharge Condition: stable CODE STATUS: Full code Diet Orders (From admission, onward)     Start     Ordered   10/12/22 1212  Diet regular Room service appropriate? Yes; Fluid consistency: Thin  Diet effective now       Question Answer Comment  Room service appropriate? Yes   Fluid consistency: Thin      10/12/22 1211            HPI: Per admitting MD, Anthony [name] is a 67 y.o. 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  He did not specify how much he was drinking.  He has not been compliant with his medications. He denied fever, chills, rhinorrhea, sore throat, wheezing or hemoptysis.  Positive chest wall pain, but denied palpitations, diaphoresis, PND, orthopnea or pitting edema of the lower extremities.  No abdominal pain, nausea, emesis, diarrhea, constipation, melena or hematochezia.  No flank pain, dysuria, frequency or hematuria.  No polyuria, polydipsia, polyphagia  or blurred vision.   Hospital Course / Discharge diagnoses: Principal Problem:   Atrial fibrillation with RVR (HCC) Active Problems:   Alcohol abuse   HTN (hypertension)   Hypomagnesemia   Dyslipidemia   Tobacco use disorder   Aspiration pneumonia (HCC)   Principal problem Acute symptomatic atrial fibrillation with RVR ([place]) -likely due to suspected nonadherence to home regimen.  He was placed on Xarelto, metoprolol, and has converted and is remaining in sinus rhythm this morning.  A 2D echo was recently done February 2024 which showed an EF of 60 to 65% with indeterminate diastolic parameters.  All his meds refilled prior to discharge   Active problems Presumed acute toxic encephalopathy -Patient was presumed to be intoxicated with alcohol per EMS report however EtOH level minimally elevated at 20 (for comparison last time patient was admitted for intoxication his alcohol level was over 500).  This appears resolved, he is alert and oriented x 4.  His UDS was positive for barbiturates as well as benzodiazepines EtOH abuse, chronic, well-documented -did not trigger CIWA significantly.  Reports he is not ready to quit and will continue drinking HTN (hypertension) - Improving, continue diltiazem/metoprolol, lisinopril Hypomagnesemia, hypokalemia -replenished, continue home supplements  Dyslipidemia -on statin Tobacco use disorder - Tobacco cessation discussed Probable BPH-patient with some incontinence, dribbling, increased urination for quite some time.  Start Flomax.  Sepsis ruled out   Discharge Instructions  Discharge Instructions     Amb referral to [facility]   Complete by: As directed       Allergies as of 10/13/2022       Reactions   Other Itching, Other (See Comments)  Seasonal allergies- Itchy eyes, runny nose, congestion   Poison Ivy Extract Rash        Medication List     STOP taking these medications    famotidine 20 MG tablet Commonly known as:  PEPCID   loperamide 2 MG capsule Commonly known as: IMODIUM   oxyCODONE 5 MG immediate release tablet Commonly known as: Oxy IR/ROXICODONE       TAKE these medications    acetaminophen 500 MG tablet Commonly known as: TYLENOL Take 1 tablet (500 mg total) by mouth every 8 (eight) hours as needed. What changed:  how much to take when to take this   budesonide-formoterol 160-4.5 MCG/ACT inhaler Commonly known as: SYMBICORT Inhale 2 puffs into the lungs 2 (two) times daily. What changed: when to take this   cyanocobalamin 1000 MCG tablet Take 1 tablet (1,000 mcg total) by mouth daily.   diltiazem 60 MG 12 hr capsule Commonly known as: CARDIZEM SR Take 1 capsule (60 mg total) by mouth every 12 (twelve) hours.   ferrous sulfate 325 (65 FE) MG tablet Take 1 tablet (325 mg total) by mouth daily with breakfast.   folic acid 1 MG tablet Commonly known as: FOLVITE Take 1 tablet (1 mg total) by mouth daily.   furosemide 20 MG tablet Commonly known as: LASIX Take 1 tablet (20 mg total) by mouth daily.   Magnesium Oxide 400 MG Caps Take 1 capsule (400 mg total) by mouth 2 (two) times daily.   methocarbamol 500 MG tablet Commonly known as: ROBAXIN Take 1 tablet (500 mg total) by mouth every 6 (six) hours as needed for muscle spasms.   metoprolol tartrate 25 MG tablet Commonly known as: LOPRESSOR Take 1/2 tablet (12.5 mg total) by mouth 2 (two) times daily.   rivaroxaban 20 MG Tabs tablet Commonly known as: XARELTO Take 1 tablet (20 mg) by mouth daily with supper.   rosuvastatin 10 MG tablet Commonly known as: CRESTOR Take 1 tablet (10 mg total) by mouth daily.   tamsulosin 0.4 MG Caps capsule Commonly known as: Flomax Take 1 capsule (0.4 mg total) by mouth daily after supper.        Follow-up [facility].   Why: Day shelter Contact information: [name], [place] [address] Phone: [phone]                 Consultations: none  Procedures/Studies:  DG Chest Port 1 View  Result Date: 10/10/2022 CLINICAL DATA:  Tachycardia EXAM: PORTABLE CHEST 1 VIEW COMPARISON:  10/09/2022 FINDINGS: Bibasilar pulmonary infiltrates are present, progressive at the right lung base, in keeping with multifocal infection or aspiration. No pneumothorax or pleural effusion. Cardiac size within normal limits. Pulmonary vascularity is normal. No acute bone abnormality. IMPRESSION: 1. Progressive bibasilar pulmonary infiltrates, in keeping with multifocal infection or aspiration. Electronically Signed   By: Fidela Salisbury M.D.   On: 10/10/2022 00:58   DG CHEST PORT 1 VIEW  Result Date: 10/09/2022 CLINICAL DATA:  Chest tube removal EXAM: PORTABLE CHEST 1 VIEW COMPARISON:  10/09/2022, 10/08/2022, 10/07/2022, chest CT 10/06/2022 FINDINGS: Right-sided central venous catheter tip at the cavoatrial region. Airspace disease at left base. Stable cardiomediastinal silhouette. Interim removal of right lower chest tube. Possible trace residual right apical pneumothorax. IMPRESSION: 1. Interim removal of right chest tube. Possible trace residual right apical pneumothorax, no significant change. 2. Persistent airspace disease at the left greater than right lung base. Electronically Signed  By: Donavan Foil M.D.   On: 10/09/2022 16:11   DG CHEST PORT 1 VIEW  Result Date: 10/09/2022 CLINICAL DATA:  Pneumothorax, right EXAM: PORTABLE CHEST 1 VIEW COMPARISON:  Chest x-ray October 08, 2022. FINDINGS: Suspect small residual right apical pneumothorax. Right chest tube remains in place. Similar bibasilar opacities. Similar position of a right-sided central venous catheter with the tip at the right atrium. Polyarticular degenerative change. IMPRESSION: 1. Suspect small residual right apical pneumothorax. Right chest tube remains in place. 2. Similar bibasilar opacities. Electronically Signed   By: [name] M.D.   On:  10/09/2022 09:09   DG CHEST PORT 1 VIEW  Result Date: 10/08/2022 CLINICAL DATA:  Pneumothorax EXAM: PORTABLE CHEST 1 VIEW COMPARISON:  Portable exam 0526 hours compared to 10/07/2022 FINDINGS: RIGHT thoracostomy tube unchanged. RIGHT subclavian line with tip projecting over SVC. Upper normal heart size with slight pulmonary vascular congestion. Mediastinal contours normal. Elevation of RIGHT diaphragm with bibasilar atelectasis. No pulmonary infiltrate or pleural effusion. Trace RIGHT apex pneumothorax decreased from previous exam. Bones demineralized. IMPRESSION: Bibasilar atelectasis. Trace RIGHT apex pneumothorax, decreased since prior study. Electronically Signed   By: Lavonia Dana M.D.   On: 10/08/2022 08:27   DG CHEST PORT 1 VIEW  Result Date: 10/07/2022 CLINICAL DATA:  Placement of central venous catheter EXAM: PORTABLE CHEST 1 VIEW COMPARISON:  Previous studies including the examination none earlier today FINDINGS: There is interval placement of central venous catheter through the right subclavian with its tip at the junction of superior vena cava and right atrium. There is tiny right apical pneumothorax. Right chest tube is noted with its tip in the lateral aspect of right mid lung field. There are small pockets of air in subcutaneous plane in the right chest wall. Increased density is seen in the lower lung fields, more so on the left side. IMPRESSION: Tip of central venous catheter is seen at the junction of superior vena cava and right atrium. There is tiny right apical pneumothorax. Right chest tube is present. Increased density seen in both lower lung fields may suggest atelectasis. Electronically Signed   By: Elmer Picker M.D.   On: 10/07/2022 18:50   DG CHEST PORT 1 VIEW  Result Date: 10/07/2022 CLINICAL DATA:  Evaluate pneumothorax. EXAM: PORTABLE CHEST 1 VIEW COMPARISON:  10/06/2022; 09/20/2022; chest CT- 09/20/2022 FINDINGS: Grossly unchanged cardiac silhouette and mediastinal  contours. Stable positioning of right-sided chest tube. Previously questioned tiny right apical pneumothorax is no longer identified. Redemonstrated minimal amount right lateral chest wall subcutaneous emphysema. There is persistent mild elevation/eventration of the right hemidiaphragm. Nipple shadow overlies the left lower lung. Left lung remains well aerated. Vascular calcifications overlie the left axilla. Surgical clips overlie the gastroesophageal junction. IMPRESSION: Stable positioning of right-sided chest tube. Previously questioned tiny right apical pneumothorax is no longer identified. Electronically Signed   By: Sandi Mariscal M.D.   On: 10/07/2022 09:00   DG Chest Port 1 View  Result Date: 10/06/2022 CLINICAL DATA:  Chest tube. EXAM: PORTABLE CHEST 1 VIEW COMPARISON:  Same day. FINDINGS: Right-sided chest tube is unchanged in position. Minimal right apical pneumothorax is noted. IMPRESSION: Stable position of right-sided chest tube. Minimal right apical pneumothorax. Electronically Signed   By: Marijo Conception M.D.   On: 10/06/2022 12:45   CT Chest W Contrast  Result Date: 10/06/2022 CLINICAL DATA:  67 year old male with shortness of breath. Requesting detox. Blunt trauma. Fall on Eliquis. Right pneumothorax status post pigtail chest tube placement. EXAM: CT  CHEST WITH CONTRAST TECHNIQUE: Multidetector CT imaging of the chest was performed during intravenous contrast administration. RADIATION DOSE REDUCTION: This exam was performed according to the departmental dose-optimization program which includes automated exposure control, adjustment of the mA and/or kV according to patient size and/or use of iterative reconstruction technique. CONTRAST:  39mL OMNIPAQUE IOHEXOL 350 MG/ML SOLN COMPARISON:  Cervical spine CT today. Recent chest CTA 09/20/2022, and portable chest x-ray 0420 hours today. CT Abdomen and Pelvis 05/20/2022. FINDINGS: Cardiovascular: Chronic calcified coronary artery atherosclerosis  and/or stents. Cardiac size is stable and within normal limits. Negative thoracic aorta aside from some atherosclerosis. Central pulmonary arteries appear to remain patent. No pericardial effusion. Mediastinum/Nodes: Stable.  No mediastinal mass or lymphadenopathy. Lungs/Pleura: Pigtail right chest tube enters the right anterior pleural space via the 5/6 intercostal space. The medial confluent and platelike right lung atelectasis and small volume residual pneumothorax at the apex. Layering fluid or secretions in the right bronchus intermedius series 4, image 80) but otherwise the major airways are patent. No pleural effusion. Mild dependent atelectasis in both lungs. Mild chronic left lower lobe scarring. No other acute pulmonary finding. Upper Abdomen: No free air or free fluid in the visible abdomen. Mostly noncontrast liver, gallbladder, spleen, pancreas, adrenal glands and kidneys appear stable and negative. Chronic postoperative changes to the gastroesophageal junction and the gastrosplenic ligament. No dilated bowel in the upper abdomen Musculoskeletal: Chronic rib fractures. Osteopenia. Acute nondisplaced right lateral 5th rib fracture on series 4, image 58 is new since 09/20/2022. No acute displaced rib fracture. Small volume pneumothorax and/or chest tube related right chest wall subcutaneous gas. Intact sternum and visible shoulder osseous structures. Stable thoracic vertebrae including mild T2 it and T3 superior endplate compression. Chronic L2 through L4 compression fractures appear stable from the CT Abdomen and Pelvis last year. IMPRESSION: 1. Acute nondisplaced right lateral 5th rib fracture. Underlying chronic rib fractures. Chronic compression fractures. 2. Right chest tube in place with small volume residual right pneumothorax. No pleural effusion. Small volume aspirated or retained secretions in the right bronchus intermedius. Atelectasis but no convincing pneumonia. 3. No other acute traumatic  injury identified in the chest. 4. Chronic calcified coronary artery and Aortic Atherosclerosis (ICD10-I70.0). Electronically Signed   By: Genevie Ann M.D.   On: 10/06/2022 06:08   CT Cervical Spine Wo Contrast  Result Date: 10/06/2022 CLINICAL DATA:  67 year old male with shortness of breath. Requesting detox. Blunt trauma. Fall on Eliquis. EXAM: CT CERVICAL SPINE WITHOUT CONTRAST TECHNIQUE: Multidetector CT imaging of the cervical spine was performed without intravenous contrast. Multiplanar CT image reconstructions were also generated. RADIATION DOSE REDUCTION: This exam was performed according to the departmental dose-optimization program which includes automated exposure control, adjustment of the mA and/or kV according to patient size and/or use of iterative reconstruction technique. COMPARISON:  Head and chest CT today reported separately. Prior cervical spine CT 09/20/2022. FINDINGS: Alignment: Stable. Straightening of cervical lordosis with up to the 4 mm of degenerative appearing anterolisthesis of C3 on C4, subtle retrolisthesis of C4 on C5. Cervicothoracic junction alignment is within normal limits. Bilateral posterior element alignment is within normal limits. Skull base and vertebrae: Visualized skull base is intact. No atlanto-occipital dissociation. C1 and C2 appears stable and intact. No acute osseous abnormality identified. Soft tissues and spinal canal: No prevertebral fluid or swelling. No visible canal hematoma. Calcified carotid atherosclerosis in the neck. Otherwise negative visible noncontrast neck soft tissues. Disc levels: Advanced upper cervical facet arthropathy C2 through C4 in the setting  of chronic spondylolisthesis. Advanced disc and endplate degeneration throughout the cervical spine. Multilevel mild cervical spinal stenosis suspected, chronic. Upper chest: There is a small right apical pneumothorax which is new from earlier this month. See chest CT reported separately. IMPRESSION:  1. Small right apical pneumothorax, see Chest CT reported separately. 2. No acute traumatic injury identified in the cervical spine. 3. Stable advanced cervical spine degeneration, including chronic spondylolisthesis at C3-C4. Electronically Signed   By: Genevie Ann M.D.   On: 10/06/2022 05:58   CT HEAD WO CONTRAST (5MM)  Result Date: 10/06/2022 CLINICAL DATA:  67 year old male with shortness of breath. Requesting detox. Blunt trauma. Fall on Eliquis. EXAM: CT HEAD WITHOUT CONTRAST TECHNIQUE: Contiguous axial images were obtained from the base of the skull through the vertex without intravenous contrast. RADIATION DOSE REDUCTION: This exam was performed according to the departmental dose-optimization program which includes automated exposure control, adjustment of the mA and/or kV according to patient size and/or use of iterative reconstruction technique. COMPARISON:  Head CT 07/19/2022.  Brain MRI 08/16/2020. FINDINGS: Brain: No midline shift, ventriculomegaly, mass effect, evidence of mass lesion, intracranial hemorrhage or evidence of cortically based acute infarction. Suspect some generalized cerebral volume loss for age, not significantly changed from last year. And possible disproportionate atrophy of the cerebellar vermis. But gray-white differentiation is stable and within normal limits for age. Vascular: Calcified atherosclerosis at the skull base. No suspicious intracranial vascular hyperdensity. Skull: No acute osseous abnormality identified. Cervical spine detailed separately. Sinuses/Orbits: Resolved sinus fluid seen last year. Visualized paranasal sinuses and mastoids are well aerated. Other: No acute orbit or scalp soft tissue injury identified. IMPRESSION: 1. No acute intracranial abnormality or acute traumatic injury identified. 2. Cervical spine CT detailed separately. Electronically Signed   By: Genevie Ann M.D.   On: 10/06/2022 05:55   DG Chest Portable 1 View  Result Date: 10/06/2022 CLINICAL  DATA:  67 year old male status post chest tube placement. EXAM: PORTABLE CHEST 1 VIEW COMPARISON:  Chest x-ray 10/06/2022. FINDINGS: Interval placement of a small bore right-sided chest tube with the tip reformed over the lower right hemithorax. Previously noted right-sided pneumothorax has substantially decreased, now small occupying approximately 5% of the volume of the right hemithorax. There is subcutaneous emphysema in the right chest wall laterally. Lungs appear clear bilaterally. No left pneumothorax. No pleural effusions. No evidence of pulmonary edema. Heart size is normal. Upper mediastinal contours are within normal limits. Mild elevation of the right hemidiaphragm. IMPRESSION: 1. Interval placement of right-sided chest tube with near complete resolution of the previously noted right-sided pneumothorax, as above. Electronically Signed   By: Vinnie Langton M.D.   On: 10/06/2022 05:11   DG Chest 2 View  Result Date: 10/06/2022 CLINICAL DATA:  Dyspnea EXAM: CHEST - 2 VIEW COMPARISON:  09/20/2022 FINDINGS: Large right pneumothorax has developed collapse of the right lung. Minimal subcutaneous gas within the right chest wall. No definite radiographic evidence of tension physiology. Left lung is clear. No pneumothorax on the left. No pleural effusion. Cardiac size within normal limits. Pulmonary vascularity is normal. IMPRESSION: 1. Large right pneumothorax. No definite radiographic evidence of tension physiology. Electronically Signed   By: Fidela Salisbury M.D.   On: 10/06/2022 02:43   DG Lumbar Spine Complete  Result Date: 09/24/2022 CLINICAL DATA:  Low back pain after fall. EXAM: LUMBAR SPINE - COMPLETE 4+ VIEW COMPARISON:  05/18/2021 FINDINGS: There are 5 non-rib-bearing lumbar vertebra, with lumbarization of S1. The lower most fully formed disc space will  be labeled L5. Chronic L2, L3, and L4 compression deformities are unchanged from prior exam. There is no evidence of acute fracture. Mild  multilevel degenerative disc disease and facet hypertrophy. The bones are subjectively under mineralized. No sacroiliac joint diastasis. Postsurgical change in the left upper quadrant. IMPRESSION: 1. No acute fracture or subluxation of the lumbar spine. 2. Chronic L2, L3, and L4 compression deformities. 3. Osteopenia/osteoporosis. Electronically Signed   By: Keith Rake M.D.   On: 09/24/2022 23:51   CT Angio Chest PE W and/or Wo Contrast  Result Date: 09/20/2022 CLINICAL DATA:  Pulmonary embolism suspected high probability. EXAM: CT ANGIOGRAPHY CHEST WITH CONTRAST TECHNIQUE: Multidetector CT imaging of the chest was performed using the standard protocol during bolus administration of intravenous contrast. Multiplanar CT image reconstructions and MIPs were obtained to evaluate the vascular anatomy. RADIATION DOSE REDUCTION: This exam was performed according to the departmental dose-optimization program which includes automated exposure control, adjustment of the mA and/or kV according to patient size and/or use of iterative reconstruction technique. CONTRAST:  154mL OMNIPAQUE IOHEXOL 350 MG/ML SOLN COMPARISON:  08/27/2022. FINDINGS: Cardiovascular: The heart is mildly enlarged and there is no pericardial effusion. Multi-vessel coronary artery calcifications are noted. There is mild atherosclerotic calcification of the aorta without evidence of aneurysm. The pulmonary trunk is normal in caliber. No definite evidence of pulmonary embolism. Evaluation is limited due to respiratory motion artifact. Mediastinum/Nodes: No mediastinal, hilar, or axillary lymphadenopathy. The thyroid gland, trachea, and esophagus are within normal limits. There is a small hiatal hernia. Lungs/Pleura: Scattered airspace disease in the lower lobes bilaterally, possible atelectasis or infiltrate, improved from the prior exam. No effusion or pneumothorax. Upper Abdomen: Gastric surgical changes are noted. No acute abnormality.  Musculoskeletal: Old healed rib fractures are noted bilaterally. Degenerative changes are present in the thoracic spine. No acute osseous abnormality. Review of the MIP images confirms the above findings. IMPRESSION: 1. No definite evidence of pulmonary embolism. Examination is limited due to respiratory motion artifact. 2. Scattered airspace opacities in the lower lobes bilaterally, possible atelectasis or infiltrate, improved from the previous exam. 3. Small hiatal hernia. 4. Cardiomegaly with coronary artery calcifications. 5. Aortic atherosclerosis. Electronically Signed   By: Brett Fairy M.D.   On: 09/20/2022 23:51   CT Cervical Spine Wo Contrast  Result Date: 09/20/2022 CLINICAL DATA:  Neck pain EXAM: CT CERVICAL SPINE WITHOUT CONTRAST TECHNIQUE: Multidetector CT imaging of the cervical spine was performed without intravenous contrast. Multiplanar CT image reconstructions were also generated. RADIATION DOSE REDUCTION: This exam was performed according to the departmental dose-optimization program which includes automated exposure control, adjustment of the mA and/or kV according to patient size and/or use of iterative reconstruction technique. COMPARISON:  Cervical spine radiographs dated 05/18/2021 FINDINGS: Alignment: Normal cervical lordosis. Skull base and vertebrae: No acute fracture. No primary bone lesion or focal pathologic process. Soft tissues and spinal canal: No prevertebral fluid or swelling. No visible canal hematoma. Disc levels: Moderate degenerative changes of the mid/lower cervical spine. Spinal canal is patent. Upper chest: Visualized lung apices are clear. Other: Visualized thyroid is unremarkable. IMPRESSION: No evidence of acute osseous abnormality. Moderate degenerative changes of the mid/lower cervical spine. Electronically Signed   By: Julian Hy M.D.   On: 09/20/2022 23:47   DG Knee Complete 4 Views Right  Result Date: 09/20/2022 CLINICAL DATA:  Pain for a year. EXAM:  RIGHT KNEE - COMPLETE 4 VIEW COMPARISON:  None Available. FINDINGS: No fracture or dislocation. Preserved joint spaces and bone mineralization.  No joint effusion. Scattered vascular calcifications are seen. Chondrocalcinosis seen in both the medial and lateral compartments. IMPRESSION: Chondrocalcinosis. Electronically Signed   By: Jill Side M.D.   On: 09/20/2022 19:52   DG Chest Port 1 View  Result Date: 09/20/2022 CLINICAL DATA:  Sepsis.  Shortness of breath for a year EXAM: PORTABLE CHEST 1 VIEW COMPARISON:  X-ray 09/11/2022. CT angiogram 08/27/2022. Multiple older exams as well. FINDINGS: Underinflated kyphotic x-ray. The apices are obscured by the kyphosis. Normal cardiopericardial silhouette. Tortuous aorta. Mild interstitial prominence. Possibly chronic. No pneumothorax, effusion or edema. No consolidation. Overlapping cardiac leads. Surgical clips in the left upper quadrant. IMPRESSION: Underinflated x-ray.  Chronic changes. Electronically Signed   By: Jill Side M.D.   On: 09/20/2022 19:46     Subjective: - no chest pain, shortness of breath, no abdominal pain, nausea or vomiting.   Discharge Exam: BP 119/80 (BP Location: Right Arm)   Pulse 81   Temp 98.8 F (37.1 C) (Oral)   Resp 16   Ht 5\' 9"  (1.753 m)   Wt 66.7 kg   SpO2 97%   BMI 21.72 kg/m   General: Pt is alert, awake, not in acute distress Cardiovascular: RRR, S1/S2 +, no rubs, no gallops Respiratory: CTA bilaterally, no wheezing, no rhonchi Abdominal: Soft, NT, ND, bowel sounds + Extremities: no edema, no cyanosis    The results of significant diagnostics from this hospitalization (including imaging, microbiology, ancillary and laboratory) are listed below for reference.     Microbiology: Recent Results (from the past 240 hour(s))  MRSA Next Gen by PCR, Nasal     Status: None   Collection Time: 10/08/22  8:51 PM   Specimen: Nasal Mucosa; Nasal Swab  Result Value Ref Range Status   MRSA by PCR Next Gen NOT  DETECTED NOT DETECTED Final    Comment: (NOTE) The GeneXpert MRSA Assay (FDA approved for NASAL specimens only), is one component of a comprehensive MRSA colonization surveillance program. It is not intended to diagnose MRSA infection nor to guide or monitor treatment for MRSA infections. Test performance is not FDA approved in patients less than 6 years old. Performed at [facility], [place] [address] [place], [place] [address]      Labs: Basic Metabolic Panel: Recent Labs  Lab 10/09/22 0416 10/10/22 0245 10/11/22 0445 10/12/22 0534 10/13/22 0503  NA 134* 135 134* 136 134*  K 3.3* 2.8* 3.6 3.1* 4.3  CL 106 107 108 106 106  CO2 20* 18* 19* 20* 21*  GLUCOSE 105* 72 93 95 86  BUN 8 7* 7* <5* 6*  CREATININE 0.60* 0.39* 0.45* 0.50* 0.37*  CALCIUM 7.6* 8.0* 7.8* 7.6* 8.0*  MG  --  1.0* 1.1* 1.1* 1.3*  PHOS  --  2.9  --   --   --    Liver Function Tests: Recent Labs  Lab 10/10/22 0245 10/11/22 0445 10/12/22 0534 10/13/22 0503  AST 20 16 17 22   ALT 12 11 11 12   ALKPHOS 64 62 54 56  BILITOT 0.5 0.5 0.4 0.3  PROT 6.5 5.8* 5.5* 5.7*  ALBUMIN 2.8* 2.3* 2.4* 2.3*   CBC: Recent Labs  Lab 10/07/22 0916 10/09/22 0416 10/10/22 0245 10/11/22 0445 10/13/22 0503  WBC 7.3 9.5 5.5 4.0 4.8  NEUTROABS  --   --  3.5  --   --   HGB 10.5* 8.4* 9.2* 8.0* 8.0*  HCT 30.6* 26.7* 28.7* 25.3* 25.6*  MCV 98.7 104.7* 104.4* 103.3* 103.6*  PLT  264 162 144* 153 171   CBG: No results for input(s): "GLUCAP" in the last 168 hours. Hgb A1c No results for input(s): "HGBA1C" in the last 72 hours. Lipid Profile No results for input(s): "CHOL", "HDL", "LDLCALC", "TRIG", "CHOLHDL", "LDLDIRECT" in the last 72 hours. Thyroid function studies No results for input(s): "TSH", "T4TOTAL", "T3FREE", "THYROIDAB" in the last 72 hours.  Invalid input(s): "FREET3" Urinalysis    Component Value Date/Time   COLORURINE STRAW (A) 10/01/2022 [place] 10/01/2022 0352   LABSPEC  1.008 10/01/2022 0352   PHURINE 5.0 10/01/2022 0352   GLUCOSEU NEGATIVE 10/01/2022 0352   HGBUR NEGATIVE 10/01/2022 0352   BILIRUBINUR NEGATIVE 10/01/2022 0352   KETONESUR NEGATIVE 10/01/2022 0352   PROTEINUR NEGATIVE 10/01/2022 0352   UROBILINOGEN 1.0 10/30/2010 1345   NITRITE NEGATIVE 10/01/2022 0352   LEUKOCYTESUR NEGATIVE 10/01/2022 0352    FURTHER DISCHARGE INSTRUCTIONS:   Get Medicines reviewed and adjusted: Please take all your medications with you for your next visit with your Primary MD   Laboratory/radiological data: Please request your Primary MD to go over all hospital tests and procedure/radiological results at the follow up, please ask your Primary MD to get all Hospital records sent to his/her office.   In some cases, they will be blood work, cultures and biopsy results pending at the time of your discharge. Please request that your primary care M.D. goes through all the records of your hospital data and follows up on these results.   Also Note the following: If you experience worsening of your admission symptoms, develop shortness of breath, life threatening emergency, suicidal or homicidal thoughts you must seek medical attention immediately by calling 911 or calling your MD immediately  if symptoms less severe.   You must read complete instructions/literature along with all the possible adverse reactions/side effects for all the Medicines you take and that have been prescribed to you. Take any new Medicines after you have completely understood and accpet all the possible adverse reactions/side effects.    Do not drive when taking Pain medications or sleeping medications (Benzodaizepines)   Do not take more than prescribed Pain, Sleep and Anxiety Medications. It is not advisable to combine anxiety,sleep and pain medications without talking with your primary care practitioner   Special Instructions: If you have smoked or chewed Tobacco  in the last 2 yrs please stop  smoking, stop any regular Alcohol  and or any Recreational drug use.   Wear Seat belts while driving.   Please note: You were cared for by a hospitalist during your hospital stay. Once you are discharged, your primary care physician will handle any further medical issues. Please note that NO REFILLS for any discharge medications will be authorized once you are discharged, as it is imperative that you return to your primary care physician (or establish a relationship with a primary care physician if you do not have one) for your post hospital discharge needs so that they can reassess your need for medications and monitor your lab values.  Time coordinating discharge: 40 minutes  SIGNED:  Marzetta Board, MD, PhD 10/13/2022, 11:22 AM

## 2022-12-02 ENCOUNTER — Observation Stay (HOSPITAL_COMMUNITY): Payer: 59

## 2022-12-02 ENCOUNTER — Encounter (HOSPITAL_COMMUNITY): Payer: Self-pay | Admitting: Internal Medicine

## 2022-12-02 ENCOUNTER — Emergency Department (HOSPITAL_COMMUNITY): Payer: 59

## 2022-12-02 ENCOUNTER — Observation Stay (HOSPITAL_COMMUNITY): Admit: 2022-12-02 | Discharge: 2022-12-02 | Disposition: A | Discharge status: Home / Self Care | Payer: 59

## 2022-12-02 ENCOUNTER — Other Ambulatory Visit: Payer: Self-pay

## 2022-12-02 ENCOUNTER — Inpatient Hospital Stay (HOSPITAL_COMMUNITY)
Admission: EM | Admit: 2022-12-02 | Discharge: 2022-12-18 | DRG: 308 | Disposition: A | Discharge status: Home / Self Care | Payer: 59 | Attending: Internal Medicine | Admitting: Internal Medicine

## 2022-12-02 DIAGNOSIS — Z8249 Family history of ischemic heart disease and other diseases of the circulatory system: Secondary | ICD-10-CM

## 2022-12-02 DIAGNOSIS — N179 Acute kidney failure, unspecified: Secondary | ICD-10-CM | POA: Diagnosis present

## 2022-12-02 DIAGNOSIS — Z79899 Other long term (current) drug therapy: Secondary | ICD-10-CM

## 2022-12-02 DIAGNOSIS — R7989 Other specified abnormal findings of blood chemistry: Secondary | ICD-10-CM | POA: Diagnosis present

## 2022-12-02 DIAGNOSIS — I10 Essential (primary) hypertension: Secondary | ICD-10-CM | POA: Diagnosis present

## 2022-12-02 DIAGNOSIS — F17290 Nicotine dependence, other tobacco product, uncomplicated: Secondary | ICD-10-CM | POA: Diagnosis present

## 2022-12-02 DIAGNOSIS — E871 Hypo-osmolality and hyponatremia: Secondary | ICD-10-CM | POA: Diagnosis present

## 2022-12-02 DIAGNOSIS — F1024 Alcohol dependence with alcohol-induced mood disorder: Secondary | ICD-10-CM | POA: Diagnosis present

## 2022-12-02 DIAGNOSIS — R0602 Shortness of breath: Secondary | ICD-10-CM | POA: Diagnosis not present

## 2022-12-02 DIAGNOSIS — L570 Actinic keratosis: Secondary | ICD-10-CM | POA: Diagnosis present

## 2022-12-02 DIAGNOSIS — I639 Cerebral infarction, unspecified: Secondary | ICD-10-CM | POA: Insufficient documentation

## 2022-12-02 DIAGNOSIS — E860 Dehydration: Secondary | ICD-10-CM | POA: Diagnosis present

## 2022-12-02 DIAGNOSIS — I110 Hypertensive heart disease with heart failure: Secondary | ICD-10-CM | POA: Diagnosis present

## 2022-12-02 DIAGNOSIS — N400 Enlarged prostate without lower urinary tract symptoms: Secondary | ICD-10-CM | POA: Diagnosis present

## 2022-12-02 DIAGNOSIS — Z5941 Food insecurity: Secondary | ICD-10-CM

## 2022-12-02 DIAGNOSIS — R569 Unspecified convulsions: Secondary | ICD-10-CM

## 2022-12-02 DIAGNOSIS — I480 Paroxysmal atrial fibrillation: Principal | ICD-10-CM | POA: Diagnosis present

## 2022-12-02 DIAGNOSIS — Z91138 Patient's unintentional underdosing of medication regimen for other reason: Secondary | ICD-10-CM

## 2022-12-02 DIAGNOSIS — I5032 Chronic diastolic (congestive) heart failure: Secondary | ICD-10-CM | POA: Diagnosis present

## 2022-12-02 DIAGNOSIS — E876 Hypokalemia: Secondary | ICD-10-CM | POA: Diagnosis present

## 2022-12-02 DIAGNOSIS — I4891 Unspecified atrial fibrillation: Secondary | ICD-10-CM | POA: Diagnosis not present

## 2022-12-02 DIAGNOSIS — Z681 Body mass index (BMI) 19 or less, adult: Secondary | ICD-10-CM

## 2022-12-02 DIAGNOSIS — I6350 Cerebral infarction due to unspecified occlusion or stenosis of unspecified cerebral artery: Secondary | ICD-10-CM | POA: Diagnosis present

## 2022-12-02 DIAGNOSIS — Z5982 Transportation insecurity: Secondary | ICD-10-CM

## 2022-12-02 DIAGNOSIS — E872 Acidosis, unspecified: Secondary | ICD-10-CM | POA: Diagnosis present

## 2022-12-02 DIAGNOSIS — T50916A Underdosing of multiple unspecified drugs, medicaments and biological substances, initial encounter: Secondary | ICD-10-CM | POA: Diagnosis present

## 2022-12-02 DIAGNOSIS — Z7951 Long term (current) use of inhaled steroids: Secondary | ICD-10-CM

## 2022-12-02 DIAGNOSIS — I2510 Atherosclerotic heart disease of native coronary artery without angina pectoris: Secondary | ICD-10-CM | POA: Diagnosis present

## 2022-12-02 DIAGNOSIS — Z7901 Long term (current) use of anticoagulants: Secondary | ICD-10-CM

## 2022-12-02 DIAGNOSIS — R4182 Altered mental status, unspecified: Secondary | ICD-10-CM

## 2022-12-02 DIAGNOSIS — I4892 Unspecified atrial flutter: Secondary | ICD-10-CM | POA: Diagnosis not present

## 2022-12-02 DIAGNOSIS — F10139 Alcohol abuse with withdrawal, unspecified: Secondary | ICD-10-CM | POA: Diagnosis present

## 2022-12-02 DIAGNOSIS — I951 Orthostatic hypotension: Secondary | ICD-10-CM | POA: Diagnosis present

## 2022-12-02 DIAGNOSIS — G312 Degeneration of nervous system due to alcohol: Secondary | ICD-10-CM | POA: Diagnosis present

## 2022-12-02 DIAGNOSIS — Z86711 Personal history of pulmonary embolism: Secondary | ICD-10-CM

## 2022-12-02 DIAGNOSIS — Z85828 Personal history of other malignant neoplasm of skin: Secondary | ICD-10-CM

## 2022-12-02 DIAGNOSIS — Z9181 History of falling: Secondary | ICD-10-CM

## 2022-12-02 DIAGNOSIS — M549 Dorsalgia, unspecified: Secondary | ICD-10-CM | POA: Diagnosis present

## 2022-12-02 DIAGNOSIS — G9341 Metabolic encephalopathy: Secondary | ICD-10-CM | POA: Diagnosis present

## 2022-12-02 DIAGNOSIS — F1014 Alcohol abuse with alcohol-induced mood disorder: Secondary | ICD-10-CM | POA: Diagnosis present

## 2022-12-02 DIAGNOSIS — Z823 Family history of stroke: Secondary | ICD-10-CM

## 2022-12-02 DIAGNOSIS — D696 Thrombocytopenia, unspecified: Secondary | ICD-10-CM | POA: Diagnosis not present

## 2022-12-02 DIAGNOSIS — Z5901 Sheltered homelessness: Secondary | ICD-10-CM

## 2022-12-02 DIAGNOSIS — E785 Hyperlipidemia, unspecified: Secondary | ICD-10-CM | POA: Diagnosis present

## 2022-12-02 DIAGNOSIS — K0889 Other specified disorders of teeth and supporting structures: Secondary | ICD-10-CM | POA: Diagnosis present

## 2022-12-02 DIAGNOSIS — F17200 Nicotine dependence, unspecified, uncomplicated: Secondary | ICD-10-CM | POA: Diagnosis present

## 2022-12-02 DIAGNOSIS — I700 Atherosclerosis of aorta: Secondary | ICD-10-CM | POA: Diagnosis present

## 2022-12-02 DIAGNOSIS — G8929 Other chronic pain: Secondary | ICD-10-CM | POA: Diagnosis present

## 2022-12-02 DIAGNOSIS — E440 Moderate protein-calorie malnutrition: Secondary | ICD-10-CM | POA: Diagnosis present

## 2022-12-02 DIAGNOSIS — F329 Major depressive disorder, single episode, unspecified: Secondary | ICD-10-CM | POA: Diagnosis present

## 2022-12-02 DIAGNOSIS — D539 Nutritional anemia, unspecified: Secondary | ICD-10-CM | POA: Diagnosis present

## 2022-12-02 DIAGNOSIS — F411 Generalized anxiety disorder: Secondary | ICD-10-CM | POA: Diagnosis present

## 2022-12-02 LAB — CBC WITH DIFFERENTIAL/PLATELET
Abs Immature Granulocytes: 0.02 10*3/uL (ref 0.00–0.07)
Basophils Absolute: 0.1 10*3/uL (ref 0.0–0.1)
Basophils Relative: 1 %
Eosinophils Absolute: 0 10*3/uL (ref 0.0–0.5)
Eosinophils Relative: 0 %
HCT: 36.6 % — ABNORMAL LOW (ref 39.0–52.0)
Hemoglobin: 11.8 g/dL — ABNORMAL LOW (ref 13.0–17.0)
Immature Granulocytes: 0 %
Lymphocytes Relative: 34 %
Lymphs Abs: 2.1 10*3/uL (ref 0.7–4.0)
MCH: 32.4 pg (ref 26.0–34.0)
MCHC: 32.2 g/dL (ref 30.0–36.0)
MCV: 100.5 fL — ABNORMAL HIGH (ref 80.0–100.0)
Monocytes Absolute: 0.6 10*3/uL (ref 0.1–1.0)
Monocytes Relative: 10 %
Neutro Abs: 3.3 10*3/uL (ref 1.7–7.7)
Neutrophils Relative %: 55 %
Platelets: 252 10*3/uL (ref 150–400)
RBC: 3.64 MIL/uL — ABNORMAL LOW (ref 4.22–5.81)
RDW: 19.6 % — ABNORMAL HIGH (ref 11.5–15.5)
WBC: 6.1 10*3/uL (ref 4.0–10.5)
nRBC: 0 % (ref 0.0–0.2)

## 2022-12-02 LAB — COMPREHENSIVE METABOLIC PANEL
ALT: 21 U/L (ref 0–44)
AST: 54 U/L — ABNORMAL HIGH (ref 15–41)
Albumin: 4.1 g/dL (ref 3.5–5.0)
Alkaline Phosphatase: 77 U/L (ref 38–126)
Anion gap: 19 — ABNORMAL HIGH (ref 5–15)
BUN: 28 mg/dL — ABNORMAL HIGH (ref 8–23)
CO2: 13 mmol/L — ABNORMAL LOW (ref 22–32)
Calcium: 9 mg/dL (ref 8.9–10.3)
Chloride: 99 mmol/L (ref 98–111)
Creatinine, Ser: 1.38 mg/dL — ABNORMAL HIGH (ref 0.61–1.24)
GFR, Estimated: 56 mL/min — ABNORMAL LOW (ref >60)
Glucose, Bld: 95 mg/dL (ref 70–99)
Potassium: 4.4 mmol/L (ref 3.5–5.1)
Sodium: 131 mmol/L — ABNORMAL LOW (ref 135–145)
Total Bilirubin: 1.5 mg/dL — ABNORMAL HIGH (ref 0.3–1.2)
Total Protein: 8.4 g/dL — ABNORMAL HIGH (ref 6.5–8.1)

## 2022-12-02 LAB — ETHANOL: Alcohol, Ethyl (B): <10 mg/dL (ref <10)

## 2022-12-02 LAB — RAPID URINE DRUG SCREEN, HOSP PERFORMED
Amphetamines: NOT DETECTED
Barbiturates: POSITIVE — AB
Benzodiazepines: NOT DETECTED
Cocaine: NOT DETECTED
Opiates: NOT DETECTED
Tetrahydrocannabinol: NOT DETECTED

## 2022-12-02 LAB — SEDIMENTATION RATE: Sed Rate: 2 mm/hr (ref 0–16)

## 2022-12-02 LAB — AMMONIA: Ammonia: 11 umol/L (ref 9–35)

## 2022-12-02 LAB — PHOSPHORUS: Phosphorus: 5.1 mg/dL — ABNORMAL HIGH (ref 2.5–4.6)

## 2022-12-02 LAB — TROPONIN I (HIGH SENSITIVITY)
Troponin I (High Sensitivity): 11 ng/L (ref <18)
Troponin I (High Sensitivity): 10 ng/L (ref <18)

## 2022-12-02 LAB — LACTIC ACID, PLASMA
Lactic Acid, Venous: 3.5 mmol/L (ref 0.5–1.9)
Lactic Acid, Venous: 1.2 mmol/L (ref 0.5–1.9)

## 2022-12-02 LAB — TSH: TSH: 3.074 u[IU]/mL (ref 0.350–4.500)

## 2022-12-02 LAB — MAGNESIUM: Magnesium: 1.6 mg/dL — ABNORMAL LOW (ref 1.7–2.4)

## 2022-12-02 LAB — VITAMIN B12: Vitamin B-12: 503 pg/mL (ref 180–914)

## 2022-12-02 LAB — BRAIN NATRIURETIC PEPTIDE: B Natriuretic Peptide: 341.6 pg/mL — ABNORMAL HIGH (ref 0.0–100.0)

## 2022-12-02 MED ORDER — FOLIC ACID 1 MG PO TABS
1.0000 mg | ORAL_TABLET | Freq: Every day | ORAL | Status: DC
  Administered 2022-12-02 – 2022-12-18 (×17): 1 mg via ORAL
  Filled 2022-12-02 (×17): qty 1

## 2022-12-02 MED ORDER — ONDANSETRON HCL 4 MG/2ML IJ SOLN: 4.0000 mg | Freq: Four times a day (QID) | INTRAMUSCULAR | Status: DC | PRN

## 2022-12-02 MED ORDER — THIAMINE HCL 100 MG/ML IJ SOLN
100.0000 mg | Freq: Every day | INTRAMUSCULAR | Status: DC
  Filled 2022-12-02: qty 2

## 2022-12-02 MED ORDER — DILTIAZEM HCL ER 60 MG PO CP12: 60.0000 mg | ORAL_CAPSULE | Freq: Two times a day (BID) | ORAL | Status: DC

## 2022-12-02 MED ORDER — ADULT MULTIVITAMIN W/MINERALS CH
1.0000 | ORAL_TABLET | Freq: Every day | ORAL | Status: DC
  Administered 2022-12-02 – 2022-12-18 (×17): 1 via ORAL
  Filled 2022-12-02 (×17): qty 1

## 2022-12-02 MED ORDER — ONDANSETRON HCL 4 MG PO TABS: 4.0000 mg | ORAL_TABLET | Freq: Four times a day (QID) | ORAL | Status: DC | PRN

## 2022-12-02 MED ORDER — LORAZEPAM 1 MG PO TABS
1.0000 mg | ORAL_TABLET | ORAL | Status: AC | PRN
  Administered 2022-12-04: 1 mg via ORAL
  Filled 2022-12-02: qty 1

## 2022-12-02 MED ORDER — MAGNESIUM OXIDE -MG SUPPLEMENT 400 (240 MG) MG PO TABS
400.0000 mg | ORAL_TABLET | Freq: Two times a day (BID) | ORAL | Status: DC
  Administered 2022-12-02 – 2022-12-18 (×33): 400 mg via ORAL
  Filled 2022-12-02 (×33): qty 1

## 2022-12-02 MED ORDER — MAGNESIUM SULFATE 2 GM/50ML IV SOLN
2.0000 g | Freq: Once | INTRAVENOUS | Status: AC
  Administered 2022-12-02: 2 g via INTRAVENOUS
  Filled 2022-12-02: qty 50

## 2022-12-02 MED ORDER — SODIUM CHLORIDE 0.9 % IV BOLUS
1000.0000 mL | Freq: Once | INTRAVENOUS | Status: AC
  Administered 2022-12-02: 1000 mL via INTRAVENOUS

## 2022-12-02 MED ORDER — METHOCARBAMOL 500 MG PO TABS
500.0000 mg | ORAL_TABLET | Freq: Four times a day (QID) | ORAL | Status: DC | PRN
  Administered 2022-12-02 – 2022-12-03 (×2): 500 mg via ORAL
  Filled 2022-12-02 (×2): qty 1

## 2022-12-02 MED ORDER — DILTIAZEM HCL-DEXTROSE 125-5 MG/125ML-% IV SOLN (PREMIX)
5.0000 mg/h | INTRAVENOUS | Status: DC
  Administered 2022-12-02: 5 mg/h via INTRAVENOUS
  Filled 2022-12-02 (×2): qty 125

## 2022-12-02 MED ORDER — MOMETASONE FURO-FORMOTEROL FUM 200-5 MCG/ACT IN AERO
2.0000 | INHALATION_SPRAY | Freq: Two times a day (BID) | RESPIRATORY_TRACT | Status: DC
  Administered 2022-12-02 – 2022-12-18 (×32): 2 via RESPIRATORY_TRACT
  Filled 2022-12-02: qty 8.8

## 2022-12-02 MED ORDER — ACETAMINOPHEN 325 MG PO TABS
650.0000 mg | ORAL_TABLET | Freq: Four times a day (QID) | ORAL | Status: DC | PRN
  Administered 2022-12-02 – 2022-12-06 (×5): 650 mg via ORAL
  Filled 2022-12-02 (×6): qty 2

## 2022-12-02 MED ORDER — RIVAROXABAN 20 MG PO TABS
20.0000 mg | ORAL_TABLET | Freq: Every day | ORAL | Status: DC
  Administered 2022-12-02 – 2022-12-17 (×16): 20 mg via ORAL
  Filled 2022-12-02 (×16): qty 1

## 2022-12-02 MED ORDER — THIAMINE MONONITRATE 100 MG PO TABS
100.0000 mg | ORAL_TABLET | Freq: Every day | ORAL | Status: DC
  Administered 2022-12-02 – 2022-12-05 (×4): 100 mg via ORAL
  Filled 2022-12-02 (×4): qty 1

## 2022-12-02 MED ORDER — METOPROLOL TARTRATE 25 MG PO TABS
12.5000 mg | ORAL_TABLET | Freq: Two times a day (BID) | ORAL | Status: DC
  Administered 2022-12-03: 12.5 mg via ORAL
  Filled 2022-12-02 (×2): qty 1

## 2022-12-02 MED ORDER — TAMSULOSIN HCL 0.4 MG PO CAPS
0.4000 mg | ORAL_CAPSULE | Freq: Every day | ORAL | Status: DC
  Administered 2022-12-02 – 2022-12-09 (×8): 0.4 mg via ORAL
  Filled 2022-12-02 (×8): qty 1

## 2022-12-02 MED ORDER — LORAZEPAM 2 MG/ML IJ SOLN
1.0000 mg | INTRAMUSCULAR | Status: AC | PRN
  Administered 2022-12-05: 2 mg via INTRAVENOUS
  Filled 2022-12-02: qty 1

## 2022-12-02 MED ORDER — ROSUVASTATIN CALCIUM 10 MG PO TABS
10.0000 mg | ORAL_TABLET | Freq: Every day | ORAL | Status: DC
  Administered 2022-12-03 – 2022-12-18 (×16): 10 mg via ORAL
  Filled 2022-12-02 (×16): qty 1

## 2022-12-02 MED ORDER — FERROUS SULFATE 325 (65 FE) MG PO TABS
325.0000 mg | ORAL_TABLET | Freq: Every day | ORAL | Status: DC
  Administered 2022-12-03 – 2022-12-18 (×16): 325 mg via ORAL
  Filled 2022-12-02 (×17): qty 1

## 2022-12-02 MED ORDER — DILTIAZEM HCL ER 60 MG PO CP12
60.0000 mg | ORAL_CAPSULE | Freq: Two times a day (BID) | ORAL | Status: DC
  Administered 2022-12-02 – 2022-12-03 (×2): 60 mg via ORAL
  Filled 2022-12-02 (×4): qty 1

## 2022-12-02 MED ORDER — ACETAMINOPHEN 650 MG RE SUPP: 650.0000 mg | Freq: Four times a day (QID) | RECTAL | Status: DC | PRN

## 2022-12-02 MED ORDER — DILTIAZEM LOAD VIA INFUSION
20.0000 mg | Freq: Once | INTRAVENOUS | Status: AC
  Administered 2022-12-02: 20 mg via INTRAVENOUS
  Filled 2022-12-02: qty 20

## 2022-12-02 MED ORDER — LACTATED RINGERS IV BOLUS
1000.0000 mL | Freq: Once | INTRAVENOUS | Status: AC
  Administered 2022-12-02: 1000 mL via INTRAVENOUS

## 2022-12-02 NOTE — ED Notes (Signed)
Assumed care of patient. Patient resting comfortably in bed with no signs of acute distress noted. Waiting on dispo. 

## 2022-12-02 NOTE — Procedures (Signed)
Patient Name: DEMETRI DALPIAZ  MRN: [id number]  Epilepsy Attending: Charlsie Quest  Referring Physician/Provider: Bobette Mo, MD  Date: 12/02/2022 Duration: 24.30 mins  Patient history: 67yo m with ams getting eeg to evaluate for seizure  Level of alertness: Awake  AEDs during EEG study: None  Technical aspects: This EEG study was done with scalp electrodes positioned according to the 10-20 International system of electrode placement. Electrical activity was reviewed with band pass filter of 1-70Hz , sensitivity of 7 uV/mm, display speed of 18mm/sec with a 60Hz  notched filter applied as appropriate. EEG data were recorded continuously and digitally stored.  Video monitoring was available and reviewed as appropriate.  Description: EEG showed continuous generalized 3 to 6 Hz theta-delta slowing. Hyperventilation and photic stimulation were not performed.     Of note, study was technically difficult due to significant electrode artifact secondary to matted hair    ABNORMALITY - Continuous slow, generalized  IMPRESSION: This technically difficult study is  suggestive of moderate diffuse encephalopathy, nonspecific etiology. No seizures or epileptiform discharges were seen throughout the recording.  Janiece Scovill Annabelle Harman

## 2022-12-02 NOTE — Progress Notes (Signed)
Day shift nurse gave to this RN that Cardizem drip should be discontinued at 1940 per order, so the drip was discontinued. PT's HR are controlled and will continue to monitor.

## 2022-12-02 NOTE — Progress Notes (Signed)
[facility] admitting physician addendum:  Due to his current mental status, the patient is unable to answer the MRI a screening questionnaire.  Attempts to contact the family were not successful.  KUB and skull x-rays ordered to rule out the presence of body metal implants or fragments prior to proceeding with MRI scanning.  Sanda Klein, MD.

## 2022-12-02 NOTE — Consult Note (Signed)
HPI:  Mr. Anthony [name] is a 67y/o male with a psychiatric history significant for alcohol use disorder, depression and anxiety who presents to the ED with complaint of dyspea, fallas at home lately. The patient reports coming to the ED due to "I have afib, chronic heart disease, fluid in my lungs, three crushed vertebrae and I can't breathe." He is a kind and delightful personality. He is affable and appears to be well aware of his medical history, context of current presentation and he is well oriented. He is clear in his concern for presentation to the ED today and that is dyspnea on exertion, says at home he was nearly unable to make it to the kitchen to get something to eat, can't take care of other ADLs. He describes himself as an alcoholic. He says he usually drinks daily but hasn't had anything to drink today mainly because he doesn't have any in the refrigerator but also because he wasn't able to physically get to the kitchen due to his dyspnea. He denies any previous inpatient psychiatric hospitalizations or suicide attempts. He is not seeing an outpatient psychiatrist nor is he prescribed any psychotropic medications. He has a safe place to live, in an apartment; no access to firearms. He says he lives in a bad neighborhood and thinks that there may be people trying to break into his apartment at times. He says he has called the police several times about this concern but was told not to call them again. He says the police did come and investigated but did not find anyone. He seems to be under the impression that these people trying to break in are members of some kind of religious group. He denies any SI/HI currently but endorses the AVH as stated above about people attempting to break into his apartment. \\  Per chart review it appears patient was seen and assessed by psychiatrist at 6am this morning with recommendations to sign off, specifically " alcohol detox/recovery center, possibly a memory care unit  or other appropriate level facility if patient is unable to meet his own needs in a community setting." Psych consult was entered in error by TTS team, in an attempt to continue coordination of care. He would benefit from [facility] consult for alcohol/substance abuse resources available within the community, or referral to SNF/memory care unit if he has any deficits. As it stands he is not of imminent danger to self or others and historically has sought help for his alcohol use, and is aware of emergency and medical resources. If these risks are to change please reach out at that time.   Psych consult discontinued at this time. Primary team has been notified.   [place]

## 2022-12-02 NOTE — ED Triage Notes (Signed)
Patient is from home he was brought in by [facility]. Patient has been calling 911 all night. Stating there is a religious group outside of his window. People are also breaking into his home. Patient is AFIB on the monitor. Patient told primary nurse he has not been taking any of his medications. He's concerned for his safety and "wants to run away from [place]". No thoughts of suicide or harming anyone.

## 2022-12-02 NOTE — Progress Notes (Signed)
EEG complete - results pending 

## 2022-12-02 NOTE — ED Provider Notes (Signed)
EMERGENCY DEPARTMENT AT [facility] Provider Note   CSN: [id number] Arrival date & time: 12/02/22  0347     History  Chief Complaint  Patient presents with   Psychiatric Evaluation   Shortness of Breath    Anthony [name] is a 67 y.o. male. With past medical history of alcohol abuse including withdrawal, anxiety, pulmonary embolism, hypertension, who presents to the emergency department with shortness of breath and psychiatric complaint.   Level 5 Caveat: altered mental status   Presents via [employer]. Per [employer] the patient has been calling 911 all night with hallucinations. He has been stating there are religious people outside of his window and trying to break into his home. He noted to nurse he does not take his medications. Denies SI/HI.  On my interview the patient does note he feels short of breath frequently when exerting himself. He cannot tell me how long he has had these symptoms for. He states when he gets up to walk to the restroom at his home he becomes short of breath and has to rest. He has associated lightheadedness. He states he has been told he had an irregular heart rhythm before but then becomes tangential and difficult to obtain further history. He does not take rate control medication or anticoagulation consistently. He denies chest pain, syncope, cough or fever.   HPI     Home Medications Prior to Admission medications   Medication Sig Start Date End Date Taking? Authorizing Provider  acetaminophen (TYLENOL) 500 MG tablet Take 1 tablet (500 mg total) by mouth every 8 (eight) hours as needed. 10/13/22   Leatha Gilding, MD  budesonide-formoterol (SYMBICORT) 160-4.5 MCG/ACT inhaler Inhale 2 puffs into the lungs 2 (two) times daily. 10/13/22   Leatha Gilding, MD  diltiazem (CARDIZEM SR) 60 MG 12 hr capsule Take 1 capsule (60 mg total) by mouth every 12 (twelve) hours. 10/13/22   Leatha Gilding, MD  ferrous sulfate 325 (65 FE) MG tablet Take 1  tablet (325 mg total) by mouth daily with breakfast. 10/13/22 11/12/22  Leatha Gilding, MD  folic acid (FOLVITE) 1 MG tablet Take 1 tablet (1 mg total) by mouth daily. 10/13/22   Leatha Gilding, MD  furosemide (LASIX) 20 MG tablet Take 1 tablet (20 mg total) by mouth daily. 10/13/22   Leatha Gilding, MD  Magnesium Oxide 400 MG CAPS Take 1 capsule (400 mg total) by mouth 2 (two) times daily. 10/13/22   Leatha Gilding, MD  methocarbamol (ROBAXIN) 500 MG tablet Take 1 tablet (500 mg total) by mouth every 6 (six) hours as needed for muscle spasms. 10/09/22   Barnetta Chapel, PA-C  metoprolol tartrate (LOPRESSOR) 25 MG tablet Take 1/2 tablet (12.5 mg total) by mouth 2 (two) times daily. 10/13/22 11/12/22  Leatha Gilding, MD  rivaroxaban (XARELTO) 20 MG TABS tablet Take 1 tablet (20 mg) by mouth daily with supper. 10/13/22 11/12/22  Leatha Gilding, MD  rosuvastatin (CRESTOR) 10 MG tablet Take 1 tablet (10 mg total) by mouth daily. 10/13/22   Leatha Gilding, MD  tamsulosin (FLOMAX) 0.4 MG CAPS capsule Take 1 capsule (0.4 mg total) by mouth daily after supper. 10/13/22   Leatha Gilding, MD      Allergies    Other and Poison ivy extract    Review of Systems   Review of Systems  Psychiatric/Behavioral:  Positive for hallucinations.   All other systems reviewed and are negative.   Physical  Exam Updated Vital Signs BP 108/78   Pulse 96   Temp (!) 97.5 F (36.4 C) (Oral)   Resp 13   SpO2 100%  Physical Exam Vitals and nursing note reviewed.  Constitutional:      Appearance: Normal appearance.  HENT:     Head: Normocephalic.     Mouth/Throat:     Mouth: Mucous membranes are dry.     Pharynx: Oropharynx is clear.  Eyes:     General: No scleral icterus.    Extraocular Movements: Extraocular movements intact.  Cardiovascular:     Rate and Rhythm: Tachycardia present. Rhythm irregularly irregular.     Pulses:          Radial pulses are 1+ on the right side and 1+ on the left side.      Heart sounds: Normal heart sounds. No murmur heard. Pulmonary:     Effort: Pulmonary effort is normal.     Breath sounds: Normal breath sounds.  Abdominal:     General: Bowel sounds are normal.     Palpations: Abdomen is soft.  Skin:    General: Skin is warm and dry.     Capillary Refill: Capillary refill takes less than 2 seconds.  Neurological:     General: No focal deficit present.     Mental Status: He is alert and oriented to person, place, and time.  Psychiatric:        Speech: Speech is tangential.     ED Results / Procedures / Treatments   Labs (all labs ordered are listed, but only abnormal results are displayed) Labs Reviewed  CBC WITH DIFFERENTIAL/PLATELET - Abnormal; Notable for the following components:      Result Value   RBC 3.64 (*)    Hemoglobin 11.8 (*)    HCT 36.6 (*)    MCV 100.5 (*)    RDW 19.6 (*)    All other components within normal limits  COMPREHENSIVE METABOLIC PANEL - Abnormal; Notable for the following components:   Sodium 131 (*)    CO2 13 (*)    BUN 28 (*)    Creatinine, Ser 1.38 (*)    Total Protein 8.4 (*)    AST 54 (*)    Total Bilirubin 1.5 (*)    GFR, Estimated 56 (*)    Anion gap 19 (*)    All other components within normal limits  BRAIN NATRIURETIC PEPTIDE - Abnormal; Notable for the following components:   B Natriuretic Peptide 341.6 (*)    All other components within normal limits  LACTIC ACID, PLASMA - Abnormal; Notable for the following components:   Lactic Acid, Venous 3.5 (*)    All other components within normal limits  ETHANOL  RAPID URINE DRUG SCREEN, HOSP PERFORMED  LACTIC ACID, PLASMA  TROPONIN I (HIGH SENSITIVITY)  TROPONIN I (HIGH SENSITIVITY)    EKG EKG Interpretation  Date/Time:  Wednesday Dec 02 2022 03:51:59 EDT Ventricular Rate:  146 PR Interval:    QRS Duration: 84 QT Interval:  311 QTC Calculation: 485 R Axis:   85 Text Interpretation: Atrial fibrillation Borderline right axis deviation  Anteroseptal infarct, old Repolarization abnormality, prob rate related Artifact in lead(s) V4 V5  Confirmed by Paula Libra ([id number]) on 12/02/2022 4:53:56 AM  Radiology DG Chest 2 View  Result Date: 12/02/2022 CLINICAL DATA:  67 year old male with shortness of breath. Atrial fibrillation. EXAM: CHEST - 2 VIEW COMPARISON:  Portable chest 10/10/2022 and earlier. FINDINGS: Semi upright AP and lateral views of the  chest at 0440 hours. Lung volumes and mediastinal contours are stable and within normal limits. No cardiomegaly. Visualized tracheal air column is within normal limits. No pneumothorax, pulmonary edema, pleural effusion or confluent lung opacity. Chronic distal right clavicle deformity. No acute osseous abnormality identified. Epigastric surgical clips are chronic. Negative visible bowel gas. IMPRESSION: No acute cardiopulmonary abnormality. Electronically Signed   By: Odessa Fleming M.D.   On: 12/02/2022 05:37    Procedures .Critical Care  Performed by: Cristopher Peru, PA-C Authorized by: Cristopher Peru, PA-C   Critical care provider statement:    Critical care time (minutes):  45   Critical care time was exclusive of:  Separately billable procedures and treating other patients   Critical care was necessary to treat or prevent imminent or life-threatening deterioration of the following conditions:  Cardiac failure, dehydration and renal failure   Critical care was time spent personally by me on the following activities:  Development of treatment plan with patient or surrogate, discussions with consultants, discussions with primary provider, evaluation of patient's response to treatment, examination of patient, obtaining history from patient or surrogate, interpretation of cardiac output measurements, review of old charts, re-evaluation of patient's condition, pulse oximetry, ordering and review of radiographic studies, ordering and review of laboratory studies and ordering and performing treatments  and interventions   I assumed direction of critical care for this patient from another provider in my specialty: no     Care discussed with: admitting provider      Medications Ordered in ED Medications  diltiazem (CARDIZEM) 1 mg/mL load via infusion 20 mg (20 mg Intravenous Bolus from Bag 12/02/22 0523)    And  diltiazem (CARDIZEM) 125 mg in dextrose 5% 125 mL (1 mg/mL) infusion (5 mg/hr Intravenous New Bag/Given 12/02/22 0524)  sodium chloride 0.9 % bolus 1,000 mL (0 mLs Intravenous Stopped 12/02/22 0603)  sodium chloride 0.9 % bolus 1,000 mL (1,000 mLs Intravenous New Bag/Given 12/02/22 0602)    ED Course/ Medical Decision Making/ A&P Clinical Course as of 12/02/22 0646  Wed Dec 02, 2022  0534 Rates in the 80s now on gtt [LA]  0541 Spoke with Dr. Imogene Burn who agrees to admit patient.  [LA]    Clinical Course User Index [LA] Cristopher Peru, PA-C    Medical Decision Making Amount and/or Complexity of Data Reviewed Labs: ordered. Radiology: ordered.  Risk Prescription drug management. Decision regarding hospitalization.  Initial Impression and Ddx 67 year old male who presents to the emergency department with initial complaints of hallucinations and brought in by [employer]. Patient PMH that increases complexity of ED encounter: Alcohol use disorder, atrial fibrillation, unstable housing, PE  Interpretation of Diagnostics I independent reviewed and interpreted the labs as followed: CBC with hemoglobin 11.8, increased from previous.  CMP with anion gap metabolic acidosis, AKI with creatinine of 1.38.  Lactic is 3.5.  Troponin is 11.  BNP 341.  - I independently visualized the following imaging with scope of interpretation limited to determining acute life threatening conditions related to emergency care: Chest x-ray, which revealed no acute findings  Patient Reassessment and Ultimate Disposition/Management 67 year old male who presents to the emergency department with an initial complaint  of psychiatric evaluation by [employer].  Found to be in A-fib with RVR on arrival in the rate of 130s to 140s.  On my initial evaluation this is a disheveled appearing male.  He is tangential when discussing his past medical history and why he is here.  He occasionally will stay on  topic and then suddenly changed topics.  He does tell me that he has been short of breath but cannot give me a chronicity.  On his arrival, found to be in A-fib with RVR which I think is driving his shortness of breath.  Given that he arrived in A-fib RVR, initially started IV fluids while obtaining lab work including a BNP.  It does appear that he has a history of the same but noncompliant on Cardizem and Xarelto.  Given that he likely is not taking his anticoagulation we will forego electrical cardioversion.  Chest x-ray with no findings. Troponin is negative, no evidence of ischemia on his EKG.  I doubt that he has ACS underlying.  BNP is mildly elevated to 341, not significant enough to likely cause his shortness of breath symptoms.  His CMP does show a anion gap metabolic acidosis, AKI with creatinine 1.38.  I think these are likely driven by dehydration.  This likely also explains his hemoglobin being 11 up from a baseline of 8.  We gave him a liter of IV fluids and starting a second liter.  For his RVR we gave him a load and infusion of diltiazem with rates back into the 80s.   Patient will need admission for A-fib exacerbation, AKI.  Additionally, I consulted and spoke with psychiatry, Dr. Truman Hayward who feels behavior is likely alcohol related rather than psychiatric and recommends having CIWA protocol in place if patient does become agitated. I have added these orders and appreciate recs.  Patient management required discussion with the following services or consulting groups:  Hospitalist Service  Complexity of Problems Addressed Acute complicated illness or Injury  Additional Data Reviewed and Analyzed Further  history obtained from: EMS on arrival, Past medical history and medications listed in the EMR, Prior ED visit notes, and Care Everywhere  Patient Encounter Risk Assessment Consideration of hospitalization  Final Clinical Impression(s) / ED Diagnoses Final diagnoses:  Shortness of breath    Rx / DC Orders ED Discharge Orders     None         Cristopher Peru, PA-C 12/02/22 0647    Molpus, Jonny Ruiz, MD 12/02/22 [phone]

## 2022-12-02 NOTE — ED Notes (Signed)
Pt dressed out into purple scrubs. Pt belongings (1 pair of pants, 1 shirt, 1 book, 1 bag, 1 pair of shoes) all placed in personal belonging bags, labeled and placed in the belongings cabinet Resus 16-18.

## 2022-12-02 NOTE — ED Notes (Signed)
ED TO INPATIENT HANDOFF REPORT  Name/Age/Gender Anthony [name] 67 y.o. male  Code Status Code Status History     Date Active Date Inactive Code Status Order ID Comments User Context   10/10/2022 0756 10/13/2022 2120 Full Code [id number]  Bobette Mo, MD ED   10/06/2022 0810 10/09/2022 2310 Full Code [id number]  Diamantina Monks, MD ED   09/21/2022 0524 09/22/2022 1716 Full Code [id number]  Hillary Bow, DO ED   09/11/2022 1223 09/17/2022 2040 Full Code [id number]  Bobette Mo, MD ED   08/27/2022 0803 09/06/2022 0155 Full Code [id number]  Bobette Mo, MD ED   05/19/2022 0555 06/02/2022 2135 Full Code [id number]  Maury Dus, MD ED   05/19/2022 0552 05/19/2022 0555 Full Code [id number]  Maury Dus, MD ED   03/23/2022 2213 03/25/2022 2325 Full Code [id number]  Mansy, Vernetta Honey, MD Inpatient   03/15/2022 2238 03/19/2022 1920 Full Code [id number]  Anselm Jungling, DO ED   06/18/2021 2127 06/19/2021 1828 Full Code [id number]  Charlynne Pander, MD ED   08/13/2020 2319 10/09/2020 1607 Full Code [id number]  Dollene Cleveland, DO ED   07/22/2020 0841 08/09/2020 1648 Full Code [id number]  Leeroy Bock, DO ED   07/21/2020 1909 07/22/2020 0840 Full Code [id number]  Jackelyn Poling, DO ED   07/18/2018 0954 07/18/2018 1318 Full Code [id number]  Charm Rings, NP ED   05/18/2018 1947 05/19/2018 1532 Full Code [id number]  Pricilla Loveless, MD ED   01/05/2018 0050 01/07/2018 1948 Full Code [id number]  Onnie Boer, MD Inpatient   09/01/2017 2047 09/05/2017 1643 Full Code [id number]  Duayne Cal, NP ED   02/17/2017 0305 02/17/2017 1614 Full Code [id number]  Felipa Furnace ED   11/02/2016 0347 11/07/2016 1938 Full Code [id number]  Leland Her, DO ED    Questions for Most Recent Historical Code Status (Order [id number])     Question Answer   By: Consent: discussion documented in EHR            Home/SNF/Other Home  Chief Complaint Atrial fibrillation with RVR (HCC) [I48.91]  Level of  Care/Admitting Diagnosis ED Disposition     ED Disposition  Admit   Condition  --   Comment  Hospital Area: [facility] [[id number]]  Level of Care: Progressive [102]  Admit to Progressive based on following criteria: CARDIOVASCULAR & THORACIC of moderate stability with acute coronary syndrome symptoms/low risk myocardial infarction/hypertensive urgency/arrhythmias/heart failure potentially compromising stability and stable post cardiovascular intervention patients.  Admit to Progressive based on following criteria: MULTISYSTEM THREATS such as stable sepsis, metabolic/electrolyte imbalance with or without encephalopathy that is responding to early treatment.  May place patient in observation at [facility] or [facility] if equivalent level of care is available:: No  Covid Evaluation: Asymptomatic - no recent exposure (last 10 days) testing not required  Diagnosis: Atrial fibrillation with RVR [facility]) [[id number]]  Admitting Physician: Bobette Mo [[id number]]  Attending Physician: Bobette Mo [[id number]]          Medical History Past Medical History:  Diagnosis Date   Actinic keratosis 11/27/2016   Alcohol abuse with alcohol-induced mood disorder (HCC) 07/18/2018   Alcohol use disorder, severe, dependence (HCC) 09/16/2006   05/22/2015 Argumentative, belligerent and verbally abusive to staff per his note from [facility]    Alcoholism [facility])    Anxiety state 04/25/2018   Aortic atherosclerosis (HCC) 08/27/2022   Chronic low back pain 09/16/2006   Followed by NS.  Per his chart from [place], he fell off two storyhouse roof while cleaning his brother's gutters and sustained compression fracture of L3, L4 and L5 and fracture of right transverse process at L3 and nondisplaced fracture of left glenoid rim many years ago. He had multiple imaging in [place] including Lumbar MRI in 2016 which showed moderate to severe spinal canal stenos   Delirium tremens  (HCC) 09/01/2017   Depression    Dry eye 11/23/2016   Foreign body in middle portion of esophagus 05/19/2022   Hiatal hernia 09/14/2016   Status Collis gastroplasty and Nissen's fundoplication on 04/29/2015 in [place]   History of delirium tremons 07/22/2020   DTs during 10/2016 08/2017. And 12/2017 admissions    History of pulmonary embolus (PE) 11/02/2016   Unprovoked 11/01/2016. Xarelto from 11/01/16 to 09/08/2017.   Hydrocele    Surgically corrected   Hypertension    Hypoalbuminemia due to protein-calorie malnutrition (HCC) 07/22/2020   Lumbar compression fracture (HCC)    Multiple rib fractures 07/22/2019   Left rib details XR: left ribs demonstrate multiple remote rib   Pancytopenia (HCC)    Rhabdomyolysis 07/22/2020   Skin cancer    exciced 2017   Tinea versicolor 06/08/2013   Tobacco use disorder 07/22/2020   Tooth infection 04/25/2018   Trigger finger, acquired 11/18/2012   Ulnar tunnel syndrome of right wrist 11/08/2012    Allergies Allergies  Allergen Reactions   Other Itching and Other (See Comments)    Seasonal allergies- Itchy eyes, runny nose, congestion   Poison Ivy Extract Rash    IV Location/Drains/Wounds Patient Lines/Drains/Airways Status     Active Line/Drains/Airways     Name Placement date Placement time Site Days   Peripheral IV 10/10/22 20 G Anterior;Left;Proximal Forearm 10/10/22  0036  Forearm  53            Labs/Imaging Results for orders placed or performed during the hospital encounter of 12/02/22 (from the past 48 hour(s))  CBC with Differential     Status: Abnormal   Collection Time: 12/02/22  4:09 AM  Result Value Ref Range   WBC 6.1 4.0 - 10.5 K/uL   RBC 3.64 (L) 4.22 - 5.81 MIL/uL   Hemoglobin 11.8 (L) 13.0 - 17.0 g/dL   HCT 16.1 (L) 09.6 - 04.5 %   MCV 100.5 (H) 80.0 - 100.0 fL   MCH 32.4 26.0 - 34.0 pg   MCHC 32.2 30.0 - 36.0 g/dL   RDW 40.9 (H) 81.1 - 91.4 %   Platelets 252 150 - 400 K/uL   nRBC 0.0 0.0 - 0.2 %    Neutrophils Relative % 55 %   Neutro Abs 3.3 1.7 - 7.7 K/uL   Lymphocytes Relative 34 %   Lymphs Abs 2.1 0.7 - 4.0 K/uL   Monocytes Relative 10 %   Monocytes Absolute 0.6 0.1 - 1.0 K/uL   Eosinophils Relative 0 %   Eosinophils Absolute 0.0 0.0 - 0.5 K/uL   Basophils Relative 1 %   Basophils Absolute 0.1 0.0 - 0.1 K/uL   Immature Granulocytes 0 %   Abs Immature Granulocytes 0.02 0.00 - 0.07 K/uL    Comment: Performed at [facility], [address], [place], [place] [address]  Troponin I (High Sensitivity)     Status: None   Collection Time: 12/02/22  4:09 AM  Result Value Ref Range   Troponin I (High Sensitivity) 11 <18 ng/L    Comment: (NOTE) Elevated high sensitivity troponin I (hsTnI) values and  significant  changes across serial measurements may suggest ACS but many other  chronic and acute conditions are known to elevate hsTnI results.  Refer to the "Links" section for chest pain algorithms and additional  guidance. Performed at [facility], [address], [place], [place] [address]   Comprehensive metabolic panel     Status: Abnormal   Collection Time: 12/02/22  4:09 AM  Result Value Ref Range   Sodium 131 (L) 135 - 145 mmol/L   Potassium 4.4 3.5 - 5.1 mmol/L    Comment: HEMOLYSIS AT THIS LEVEL MAY AFFECT RESULT   Chloride 99 98 - 111 mmol/L   CO2 13 (L) 22 - 32 mmol/L   Glucose, Bld 95 70 - 99 mg/dL    Comment: Glucose reference range applies only to samples taken after fasting for at least 8 hours.   BUN 28 (H) 8 - 23 mg/dL   Creatinine, Ser 1.19 (H) 0.61 - 1.24 mg/dL   Calcium 9.0 8.9 - 14.7 mg/dL   Total Protein 8.4 (H) 6.5 - 8.1 g/dL   Albumin 4.1 3.5 - 5.0 g/dL   AST 54 (H) 15 - 41 U/L    Comment: HEMOLYSIS AT THIS LEVEL MAY AFFECT RESULT   ALT 21 0 - 44 U/L    Comment: HEMOLYSIS AT THIS LEVEL MAY AFFECT RESULT   Alkaline Phosphatase 77 38 - 126 U/L   Total Bilirubin 1.5 (H) 0.3 - 1.2 mg/dL    Comment: HEMOLYSIS AT  THIS LEVEL MAY AFFECT RESULT   GFR, Estimated 56 (L) >60 mL/min    Comment: (NOTE) Calculated using the CKD-EPI Creatinine Equation (2021)    Anion gap 19 (H) 5 - 15    Comment: Performed at [facility], [address], [place], [place] [address]  Brain natriuretic peptide     Status: Abnormal   Collection Time: 12/02/22  4:09 AM  Result Value Ref Range   B Natriuretic Peptide 341.6 (H) 0.0 - 100.0 pg/mL    Comment: Performed at [facility], [address] [place], [place] [address]  Ethanol     Status: None   Collection Time: 12/02/22  4:10 AM  Result Value Ref Range   Alcohol, Ethyl (B) <10 <10 mg/dL    Comment: (NOTE) Lowest detectable limit for serum alcohol is 10 mg/dL.  For medical purposes only. Performed at [facility], [address], [place], [place] [address]   Lactic acid, plasma     Status: Abnormal   Collection Time: 12/02/22  4:50 AM  Result Value Ref Range   Lactic Acid, Venous 3.5 (HH) 0.5 - 1.9 mmol/L    Comment: CRITICAL RESULT CALLED TO, READ BACK BY AND VERIFIED WITH HARRIS,K @ 0528 12/02/22 BY CHILDRESS,E Performed at [facility], [address], [place], [place] [address]    DG Chest 2 View  Result Date: 12/02/2022 CLINICAL DATA:  67 year old male with shortness of breath. Atrial fibrillation. EXAM: CHEST - 2 VIEW COMPARISON:  Portable chest 10/10/2022 and earlier. FINDINGS: Semi upright AP and lateral views of the chest at 0440 hours. Lung volumes and mediastinal contours are stable and within normal limits. No cardiomegaly. Visualized tracheal air column is within normal limits. No pneumothorax, pulmonary edema, pleural effusion or confluent lung opacity. Chronic distal right clavicle deformity. No acute osseous abnormality identified. Epigastric surgical clips are chronic. Negative visible bowel gas. IMPRESSION: No acute cardiopulmonary abnormality. Electronically Signed   By: Odessa Fleming  M.D.   On: 12/02/2022 05:37    Pending Labs Unresulted Labs (From admission, onward)     Start     Ordered   12/02/22 0710  Magnesium  Add-on,   AD        12/02/22 0709   12/02/22 0710  Phosphorus  Add-on,   AD        12/02/22 0709   12/02/22 0450  Lactic acid, plasma  Now then every 2 hours,   R (with STAT occurrences)      12/02/22 0449   12/02/22 0410  Rapid urine drug screen (hospital performed)  ONCE - STAT,   STAT        12/02/22 0409            Vitals/Pain Today's Vitals   12/02/22 0354 12/02/22 0415 12/02/22 0545 12/02/22 0630  BP: 134/73  93/61 108/78  Pulse: 62 (!) 128  96  Resp: (!) 24 18 19 13   Temp: (!) 97.5 F (36.4 C)     TempSrc: Oral     SpO2: 100% 100%  100%  PainSc:  0-No pain      Isolation Precautions No active isolations  Medications Medications  diltiazem (CARDIZEM) 1 mg/mL load via infusion 20 mg (20 mg Intravenous Bolus from Bag 12/02/22 0523)    And  diltiazem (CARDIZEM) 125 mg in dextrose 5% 125 mL (1 mg/mL) infusion (5 mg/hr Intravenous New Bag/Given 12/02/22 0524)  LORazepam (ATIVAN) tablet 1-4 mg (has no administration in time range)    Or  LORazepam (ATIVAN) injection 1-4 mg (has no administration in time range)  thiamine (VITAMIN B1) tablet 100 mg (has no administration in time range)    Or  thiamine (VITAMIN B1) injection 100 mg (has no administration in time range)  folic acid (FOLVITE) tablet 1 mg (has no administration in time range)  multivitamin with minerals tablet 1 tablet (has no administration in time range)  magnesium sulfate IVPB 2 g 50 mL (has no administration in time range)  sodium chloride 0.9 % bolus 1,000 mL (0 mLs Intravenous Stopped 12/02/22 0603)  sodium chloride 0.9 % bolus 1,000 mL (0 mLs Intravenous Stopped 12/02/22 0705)    Mobility walks

## 2022-12-02 NOTE — ED Notes (Signed)
SBAR placed in the chart and secure chat sent to RN receiving patient.

## 2022-12-02 NOTE — TOC Initial Note (Signed)
Transition of Care [facility]) - Initial/Assessment Note    Patient Details  Name: Anthony [name] MRN: [id number] Date of Birth: January 11, 1956  [facility] CM/SW Contact:    Howell Rucks, RN Phone Number: 12/02/2022, 1:21 PM  Clinical Narrative:  Met with pt at bedside to introduce role of TOC/NCM and review for dc needs. TOC referral for PCP and Substance Abuse resources. Pt provided with list of [facility] outpatient clinics with offer to call and schedule appt for pt, pt declined,  reports he will call his insurance to select a PCP, pt provided hospital face sheet with insurance subscriber ID and customer service number. Pt declined substance abuse resources. Pt reports he currently lives in a group home for substance use, states he doesn't feel safe and plans to discharge to the [facility], will provide bus pass at discharge. Will continue to follow.                 Expected Discharge Plan: Homeless Shelter Barriers to Discharge: Continued Medical Work up   Patient Goals and CMS Choice Patient states their goals for this hospitalization and ongoing recovery are:: [facility] CMS Medicare.gov Compare Post Acute Care list provided to:: Patient Choice offered to / list presented to : Patient      Expected Discharge Plan and Services   Discharge Planning Services: CM Consult   Living arrangements for the past 2 months: Group Home                                      Prior Living Arrangements/Services Living arrangements for the past 2 months: Group Home Lives with:: Self Patient language and need for interpreter reviewed:: Yes Do you feel safe going back to the place where you live?: Yes      Need for Family Participation in Patient Care: No (Comment) Care giver support system in place?: Yes (comment)   Criminal Activity/Legal Involvement Pertinent to Current Situation/Hospitalization: No - Comment as needed  Activities of Daily Living      Permission  Sought/Granted Permission sought to share information with : Case Manager Permission granted to share information with : Yes, Verbal Permission Granted  Share Information with NAME: Fannie Knee, RN           Emotional Assessment Appearance:: Appears stated age Attitude/Demeanor/Rapport: Gracious Affect (typically observed): Accepting Orientation: : Oriented to Self, Oriented to Place, Oriented to  Time, Oriented to Situation Alcohol / Substance Use: Alcohol Use Psych Involvement: Yes (comment)  Admission diagnosis:  Shortness of breath [R06.02] Atrial fibrillation with RVR (HCC) [I48.91] Patient Active Problem List   Diagnosis Date Noted   Abnormal LFTs 12/02/2022   Traumatic pneumothorax, initial encounter 10/06/2022   SIRS (systemic inflammatory response syndrome) (HCC) 09/21/2022   Homelessness 09/21/2022   Atrial fibrillation (HCC) 09/12/2022   Moderate protein malnutrition (HCC) 09/11/2022   Chronic diarrhea 09/11/2022   C. difficile colitis 09/11/2022   Aspiration pneumonia (HCC) 08/27/2022   Aortic atherosclerosis (HCC) 08/27/2022   Depression, unspecified 08/09/2022   Hyponatremia 06/01/2022   Non compliance w medication regimen 05/31/2022   Delirium 05/25/2022   Stricture and stenosis of esophagus 05/21/2022   Gastritis and gastroduodenitis 05/21/2022   Loose stools 05/20/2022   Malnutrition of moderate degree 05/20/2022   Acute on chronic congestive heart failure (HCC)    Leukocytosis 05/19/2022   Sepsis (HCC) 05/19/2022   Cellulitis of left leg, possible  05/19/2022    Class: Question of   Moniliasis, interdigital 05/19/2022   Open toe wound, right foot, medial fourth digit 05/19/2022   Malnutrition (HCC) 05/19/2022   Alcohol abuse 03/24/2022   Dyslipidemia 03/24/2022   Acute on chronic diastolic CHF (congestive heart failure) (HCC) 03/24/2022   Acute respiratory failure with hypoxia (HCC) 03/23/2022   Generalized weakness    Atrial fibrillation with RVR  (HCC) 03/15/2022   Hypokalemia 03/15/2022   Decreased functional mobility and endurance 06/22/2021   Iron deficiency anemia 10/16/2020   Cough    Hypomagnesemia    Protein-calorie malnutrition, severe 08/22/2020   Alcohol use disorder, severe, in controlled environment (HCC)    Pressure injury of skin 08/18/2020   Urinary tract infection without hematuria    Severe sepsis (HCC) 08/14/2020   Paroxysmal atrial fibrillation (HCC) 08/13/2020   Rhabdomyolysis 07/22/2020   Cerebral ventriculomegaly 07/22/2020   Hemoglobin decreased 07/22/2020   History of delirium tremons 07/22/2020   Hypoalbuminemia due to protein-calorie malnutrition (HCC) 07/22/2020   Tobacco use disorder 07/22/2020   Alcohol withdrawal delirium (HCC)    Dehydration    Alcohol abuse with alcohol-induced mood disorder (HCC) 07/18/2018   Depression 05/26/2018   Anxiety state 04/25/2018   Need for immunization against influenza 04/25/2018   Alcohol withdrawal (HCC) 09/01/2017   DOE (dyspnea on exertion) 11/27/2016   Actinic keratosis 11/27/2016   Macrocytic anemia 11/23/2016   History of pulmonary embolus (PE) 11/02/2016   Hiatal hernia 09/14/2016   Skin cancer 08/13/2016   Poor social situation 06/09/2013   Tinea versicolor 06/08/2013   Back pain 05/07/2013   Seborrheic dermatitis of scalp 11/08/2012   HTN (hypertension) 04/11/2012   Alcohol use disorder, severe, dependence (HCC) 09/16/2006   PCP:  Pcp, No Pharmacy:   [facility] [address] [place] [address] Phone: [phone] Fax: [phone]     Social Determinants of Health (SDOH) Social History: SDOH Screenings   Food Insecurity: Food Insecurity Present (10/10/2022)  Housing: High Risk (10/10/2022)  Transportation Needs: Unmet Transportation Needs (10/10/2022)  Utilities: At Risk (10/10/2022)  Depression (PHQ2-9): Medium Risk (10/16/2020)  Tobacco Use: High Risk (12/02/2022)   SDOH Interventions:      Readmission Risk Interventions    10/12/2022    1:29 PM 08/28/2022    2:00 PM 08/27/2022   10:30 AM  Readmission Risk Prevention Plan  Transportation Screening Complete Complete Complete  Medication Review Oceanographer) Complete  Complete  PCP or Specialist appointment within 3-5 days of discharge Complete Complete Complete  HRI or Home Care Consult Complete  Complete  SW Recovery Care/Counseling Consult Complete Complete Complete  Palliative Care Screening Not Applicable Not Applicable Not Applicable  Skilled Nursing Facility Not Applicable Not Applicable Not Applicable

## 2022-12-02 NOTE — BH Assessment (Signed)
This consult has been deferred to [facility]. TTS consult will be completed by [facility] provider Truman Hayward, DO at approx 6:45am. Coordinator is Sharyon Cable. Secure chat sent.

## 2022-12-02 NOTE — H&P (Signed)
History and Physical    Patient: Anthony [name]:[id number] DOB: 12/14/1955 DOA: 12/02/2022 DOS: the patient was seen and examined on 12/02/2022 PCP: Pcp, No  Patient coming from: Home  Chief Complaint:  Chief Complaint  Patient presents with   Psychiatric Evaluation   Shortness of Breath   HPI: [name] is a 67 y.o. 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  The patient apparently told one of the nurses that he is not taking his medications.  He was stating that there was a religious group outside of his window and people trying to break into his home.  He has been drinking 6-12 beers daily, but but he last drank 2 days ago on Monday. He denied fever, chills, rhinorrhea, sore throat, wheezing or hemoptysis.  No precordial chest pain, but has been having left-sided chest wall tenderness.  He takes several tablets of acetaminophen a day.  No palpitations, diaphoresis, PND, orthopnea or pitting edema of the lower extremities.  No abdominal pain, nausea, emesis, diarrhea, constipation, melena or hematochezia.  No flank pain, dysuria or hematuria but complains about having frequency at times.  No polyuria, polydipsia, polyphagia or blurred vision.   ED course: Initial vital signs were temperature 97.5 F, pulse 62, respirations 24, BP 134/73 mmHg O2 sat 100% on room air.  The patient received 2000 mL of normal saline bolus and was started on a Cardizem infusion.  Lab work: CBC 0 white count 6.1, hemoglobin 11.8 g/dL  with an MCV of 811.9 fL and platelets 252.  Troponin and alcohol level normal.  BNP 341.6 pg/mL.  Lactic acid 3.5 mmol/L.  CMP with a sodium 131 and CO2 of 13 mmol/L with an anion gap of 19.  Sodium 4.4 and chloride 99 mmol/L.  Normal glucose.  Total bilirubin 1.5, BUN 28 and creatinine 1.38 mg/dL.  Total protein 8.4 g/dL.  AST 54 units/L.  Normal albumin, ALT and alkaline phosphatase.  Phosphorus was 5.1 and magnesium 1.6 mg/dL.  Imaging: 2 view chest radiograph with no acute cardiopulmonary abnormality.  No cardiomegaly.  No pneumothorax, pulmonary edema, pleural effusion or confluent lobe opacity.  Chronic distal right clavicle deformity.   Review of Systems: As mentioned in the history of present illness. All other systems reviewed and are negative. Past Medical History:  Diagnosis Date   Actinic keratosis 11/27/2016   Alcohol abuse with alcohol-induced mood disorder (HCC) 07/18/2018   Alcohol use disorder, severe, dependence (HCC) 09/16/2006   05/22/2015 Argumentative, belligerent and verbally abusive to staff per his note from [place]    Alcoholism [facility])    Anxiety state 04/25/2018   Aortic atherosclerosis (HCC) 08/27/2022   Chronic low back pain 09/16/2006   Followed by [facility].  Per his chart from [place], he fell off two storyhouse roof while cleaning his brother's gutters and sustained compression fracture of L3, L4 and L5 and fracture of right transverse process at L3 and nondisplaced fracture of left glenoid rim many years ago. He had multiple imaging in [place] including Lumbar MRI in 2016 which showed moderate to severe spinal canal stenos   Delirium tremens (HCC) 09/01/2017   Depression  Dry eye 11/23/2016   Foreign body in middle portion of esophagus 05/19/2022   Hiatal hernia 09/14/2016   Status Collis gastroplasty and Nissen's fundoplication on 04/29/2015 in [place]   History of delirium tremons 07/22/2020   DTs during 10/2016 08/2017. And 12/2017 admissions     History of pulmonary embolus (PE) 11/02/2016   Unprovoked 11/01/2016. Xarelto from 11/01/16 to 09/08/2017.   Hydrocele    Surgically corrected   Hypertension    Hypoalbuminemia due to protein-calorie malnutrition (HCC) 07/22/2020   Lumbar compression fracture (HCC)    Multiple rib fractures 07/22/2019   Left rib details XR: left ribs demonstrate multiple remote rib   Pancytopenia (HCC)    Rhabdomyolysis 07/22/2020   Skin cancer    exciced 2017   Tinea versicolor 06/08/2013   Tobacco use disorder 07/22/2020   Tooth infection 04/25/2018   Trigger finger, acquired 11/18/2012   Ulnar tunnel syndrome of right wrist 11/08/2012   Past Surgical History:  Procedure Laterality Date   BIOPSY  05/21/2022   Procedure: BIOPSY;  Surgeon: Lynann Bologna, MD;  Location: [facility] ENDOSCOPY;  Service: Gastroenterology;;   CATARACT EXTRACTION     ESOPHAGOGASTRODUODENOSCOPY (EGD) WITH PROPOFOL N/A 05/21/2022   Procedure: ESOPHAGOGASTRODUODENOSCOPY (EGD) WITH PROPOFOL;  Surgeon: Lynann Bologna, MD;  Location: [facility] ENDOSCOPY;  Service: Gastroenterology;  Laterality: N/A;   FOREIGN BODY REMOVAL N/A 05/21/2022   Procedure: FOREIGN BODY REMOVAL;  Surgeon: Lynann Bologna, MD;  Location: [facility] ENDOSCOPY;  Service: Gastroenterology;  Laterality: N/A;   HIATAL HERNIA REPAIR  2016   HYDROCELE EXCISION / REPAIR  2010   SKIN CANCER EXCISION Left    Excised 2017   Social History:  reports that he has never smoked. His smokeless tobacco use includes snuff. He reports current alcohol use of about 43.0 standard drinks of alcohol per week. He reports current drug use. Drug: Marijuana.  Allergies  Allergen Reactions   Other Itching and Other (See Comments)    Seasonal allergies- Itchy eyes, runny nose, congestion   Poison Ivy Extract Rash    Family History  Problem Relation Age of Onset   Heart attack Father        died at 78 years   Dementia Father    Stroke Father    Cancer Sister    Colon cancer Neg Hx    Colon polyps  Neg Hx    Esophageal cancer Neg Hx    Stomach cancer Neg Hx    Rectal cancer Neg Hx     Prior to Admission medications   Medication Sig Start Date End Date Taking? Authorizing Provider  acetaminophen (TYLENOL) 500 MG tablet Take 1 tablet (500 mg total) by mouth every 8 (eight) hours as needed. 10/13/22   Leatha Gilding, MD  budesonide-formoterol (SYMBICORT) 160-4.5 MCG/ACT inhaler Inhale 2 puffs into the lungs 2 (two) times daily. 10/13/22   Leatha Gilding, MD  diltiazem (CARDIZEM SR) 60 MG 12 hr capsule Take 1 capsule (60 mg total) by mouth every 12 (twelve) hours. 10/13/22   Leatha Gilding, MD  ferrous sulfate 325 (65 FE) MG tablet Take 1 tablet (325 mg total) by mouth daily with breakfast. 10/13/22 11/12/22  Leatha Gilding, MD  folic acid (FOLVITE) 1 MG tablet Take 1 tablet (1 mg total) by mouth daily. 10/13/22   Leatha Gilding, MD  furosemide (LASIX) 20 MG tablet Take 1 tablet (20 mg total) by mouth daily. 10/13/22   Leatha Gilding, MD  Magnesium Oxide 400 MG CAPS Take 1  capsule (400 mg total) by mouth 2 (two) times daily. 10/13/22   Leatha Gilding, MD  methocarbamol (ROBAXIN) 500 MG tablet Take 1 tablet (500 mg total) by mouth every 6 (six) hours as needed for muscle spasms. 10/09/22   Barnetta Chapel, PA-C  metoprolol tartrate (LOPRESSOR) 25 MG tablet Take 1/2 tablet (12.5 mg total) by mouth 2 (two) times daily. 10/13/22 11/12/22  Leatha Gilding, MD  rivaroxaban (XARELTO) 20 MG TABS tablet Take 1 tablet (20 mg) by mouth daily with supper. 10/13/22 11/12/22  Leatha Gilding, MD  rosuvastatin (CRESTOR) 10 MG tablet Take 1 tablet (10 mg total) by mouth daily. 10/13/22   Leatha Gilding, MD  tamsulosin (FLOMAX) 0.4 MG CAPS capsule Take 1 capsule (0.4 mg total) by mouth daily after supper. 10/13/22   Leatha Gilding, MD    Physical Exam: Vitals:   12/02/22 0354 12/02/22 0415 12/02/22 0545 12/02/22 0630  BP: 134/73  93/61 108/78  Pulse: 62 (!) 128  96  Resp: (!) 24 18 19 13    Temp: (!) 97.5 F (36.4 C)     TempSrc: Oral     SpO2: 100% 100%  100%   Physical Exam Vitals and nursing note reviewed.  Constitutional:      General: He is awake. He is not in acute distress. HENT:     Head: Normocephalic.     Nose: No rhinorrhea.     Mouth/Throat:     Mouth: Mucous membranes are moist.  Eyes:     General: No scleral icterus.    Pupils: Pupils are equal, round, and reactive to light.  Neck:     Vascular: No JVD.  Cardiovascular:     Rate and Rhythm: Tachycardia present. Rhythm irregular.     Heart sounds: S1 normal and S2 normal.  Pulmonary:     Effort: Pulmonary effort is normal.     Breath sounds: No wheezing, rhonchi or rales.  Abdominal:     General: Bowel sounds are normal. There is no distension.     Palpations: Abdomen is soft.     Tenderness: There is no abdominal tenderness. There is no guarding.  Musculoskeletal:     Cervical back: Neck supple.     Right lower leg: No edema.     Left lower leg: No edema.  Skin:    General: Skin is warm and dry.  Neurological:     General: No focal deficit present.     Mental Status: He is alert. He is disoriented.  Psychiatric:        Attention and Perception: Attention normal. He does not perceive auditory or visual hallucinations.        Mood and Affect: Mood is anxious.        Speech: Speech is tangential.        Behavior: Behavior is cooperative.        Thought Content: Thought content is paranoid.     Comments: Continues to state that people broke into his room.    Data Reviewed:  Results are pending, will review when available.  08/28/2022 TTE IMPRESSIONS:   1. Left ventricular ejection fraction, by estimation, is 60 to 65%. The  left ventricle has normal function. The left ventricle has no regional  wall motion abnormalities. Left ventricular diastolic parameters are  indeterminate.   2. Right ventricular systolic function is normal. The right ventricular  size is mildly enlarged.  Tricuspid regurgitation signal is inadequate for  assessing PA pressure.  3. The mitral valve is normal in structure. No evidence of mitral valve  regurgitation. No evidence of mitral stenosis.   4. The aortic valve was not well visualized. Aortic valve regurgitation  is not visualized. No aortic stenosis is present.   Assessment and Plan: Principal Problem:   Paroxysmal atrial fibrillation (HCC)   Atrial fibrillation with RVR (HCC) CHA2DS2-VASc Score of at least 4.  Observation/PCU. Continue Xarelto 20 mg p.o. daily. Continue diltiazem infusion. Continue metoprolol 12.5 mg oral twice daily. Replace and keep electrolytes optimized.  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 Psych evaluation appreciated. Will follow the recommendations. -(Lab chemistry, EEG and MRI). Continue CIWA protocol with lorazepam dosing as needed. Continue folate, MVI and thiamine replacement.    Abnormal LFTs Minimally impaired. In the setting of alcohol use. Follow-up LFTs in the morning.    HTN (hypertension) Continue metoprolol as above. Monitor blood pressure and heart rate.    Hyponatremia Secondary to beer Potomania. Follow-up sodium level.    Hypomagnesemia Replacing. Alcohol cessation advised.    Dyslipidemia   Aortic atherosclerosis (HCC) Continue rosuvastatin 10 mg p.o. daily.    Macrocytic anemia Monitor hematocrit and hemoglobin. Nutritional supplementation.    Tobacco use disorder Uses smokeless tobacco. Cessation advised.     Advance Care Planning:   Code Status: Full Code   Consults: Psychiatry Truman Hayward, DO).  Family Communication:   Severity of Illness: The appropriate patient status for this patient is OBSERVATION. Observation status is judged to be reasonable and necessary in order to provide the required intensity of service to ensure the patient's safety.  The patient's presenting symptoms, physical exam findings, and initial radiographic and laboratory data in the context of their medical condition is felt to place them at decreased risk for further clinical deterioration. Furthermore, it is anticipated that the patient will be medically stable for discharge from the hospital within 2 midnights of admission.   Author: Bobette Mo, MD 12/02/2022 7:09 AM  For on call review [email or web address].   This document was prepared using Dragon voice recognition software and may contain some unintended transcription errors.

## 2022-12-02 NOTE — Consult Note (Signed)
[facility] Consult Note  Patient Name: IVON DIRCKS MRN: [id number] DOB: Jan 18, 1956 DATE OF Consult: 12/02/2022  PRIMARY PSYCHIATRIC DIAGNOSES  1.  Alcohol Use Disorder 2.  Alcoholic Hallucinosis 3.  Chronic Alcoholic Encephalopathy  RECOMMENDATIONS  Recommendations: Medication recommendations: recommend CIWA protocol for potential alcohol withdrawal. B12/folate & multivitamin daily given nutrient insufficiency. Non-Medication/therapeutic recommendations: recommend safety precautions per hospital protocol. Ongoing outpatient evaluation and management is recommended for their neurocognitive disorder. For agitation in delirium, non-pharmacological intervention techniques are recommended first, i.e. frequent redirection/reorientation, using simple and clear directions, reinforcing normal sleep-wake cycle (shades up during the day, drawn at night), and avoiding physical restraints when possible. Workup items: B12, thiamine, folate, Vitamin D, BMP, TSH, RPR, UA, ammonia, NMDA, CSR, HIV, MRI, EEG Is inpatient psychiatric hospitalization recommended for this patient? No (Explain why): appears to be sequelae from chronic alcoholism rather than primary psychiatric process. Is another care setting recommended for this patient? (examples may include Crisis Stabilization Unit, Residential/Recovery Treatment, ALF/SNF, Memory Care Unit)  Yes (Explain why): alcohol detox/recovery center, possibly a memory care unit or other appropriate level facility if patient is unable to meet his own needs in a community setting. Follow-Up Telepsychiatry C/L services: We will sign off for now. Please re-consult our service if needed for any concerning changes in the patient's condition, discharge planning, or questions. Communication: Treatment team members (and family members if applicable) who were involved in treatment/care discussions and planning, and with whom we spoke or engaged with via secure text/chat, include  the following: ED PA Autry.  Thank you for involving [place] in the care of this patient. If you have any additional questions or concerns, please call [phone] and ask for me or the provider on-call.  TELEPSYCHIATRY ATTESTATION & CONSENT  As the provider for this telehealth consult, I attest that I verified the patient's identity using two separate identifiers, introduced myself to the patient, provided my credentials, disclosed my location, and performed this encounter via a HIPAA-compliant, real-time, face-to-face, two-way, interactive audio and video platform and with the full consent and agreement of the patient (or guardian as applicable.)  Patient physical location: [facility]. Telehealth provider physical location: home office in state of [place].  Video start time: 6:04 AM Video end time: 6:44 AM  IDENTIFYING DATA  Hamzah A Feider is a 67 y.o. year-old male for whom a psychiatric consultation has been ordered by the primary provider. The patient was identified using two separate identifiers.  CHIEF COMPLAINT/REASON FOR CONSULT  Dyspnea  HISTORY OF PRESENT ILLNESS (HPI)  Mr. Swartout is a 67y/o male with a psychiatric history significant for alcohol use disorder, depression and anxiety who presents to the ED with complaint of dyspea, fallas at home lately. The patient reports coming to the ED due to "I have afib, chronic heart disease, fluid in my lungs, three crushed vertebrae and I can't breathe." He is a kind and delightful personality. He is affable and appears to be well aware of his medical history, context of current presentation and he is well oriented. He is clear in his concern for presentation to the ED today and that is dyspnea on exertion, says at home he was nearly unable to make it to the kitchen to get something to eat, can't take care of other ADLs. He describes himself as an alcoholic. He says he usually drinks daily but hasn't had anything to drink today mainly because he doesn't have  any in the refrigerator but also because he wasn't able to physically  get to the kitchen due to his dyspnea. He denies any previous inpatient psychiatric hospitalizations or suicide attempts. He is not seeing an outpatient psychiatrist nor is he prescribed any psychotropic medications. He has a safe place to live, in an apartment; no access to firearms. He says he lives in a bad neighborhood and thinks that there may be people trying to break into his apartment at times. He says he has called the police several times about this concern but was told not to call them again. He says the police did come and investigated but did not find anyone. He seems to be under the impression that these people trying to break in are members of some kind of religious group. He denies any SI/HI currently but endorses the AVH as stated above about people attempting to break into his apartment.  PAST PSYCHIATRIC HISTORY  Depression, anxiety, alcoholism Otherwise as per HPI above.  PAST MEDICAL HISTORY  Past Medical History:  Diagnosis Date   Actinic keratosis 11/27/2016   Alcohol abuse with alcohol-induced mood disorder (HCC) 07/18/2018   Alcohol use disorder, severe, dependence (HCC) 09/16/2006   05/22/2015 Argumentative, belligerent and verbally abusive to staff per his note from [place]    Alcoholism [facility])    Anxiety state 04/25/2018   Aortic atherosclerosis (HCC) 08/27/2022   Chronic low back pain 09/16/2006   Followed by NS.  Per his chart from [place], he fell off two storyhouse roof while cleaning his brother's gutters and sustained compression fracture of L3, L4 and L5 and fracture of right transverse process at L3 and nondisplaced fracture of left glenoid rim many years ago. He had multiple imaging in [place] including Lumbar MRI in 2016 which showed moderate to severe spinal canal stenos   Delirium tremens (HCC) 09/01/2017   Depression    Dry eye 11/23/2016   Foreign body in middle portion of  esophagus 05/19/2022   Hiatal hernia 09/14/2016   Status Collis gastroplasty and Nissen's fundoplication on 04/29/2015 in [place]   History of delirium tremons 07/22/2020   DTs during 10/2016 08/2017. And 12/2017 admissions    History of pulmonary embolus (PE) 11/02/2016   Unprovoked 11/01/2016. Xarelto from 11/01/16 to 09/08/2017.   Hydrocele    Surgically corrected   Hypertension    Hypoalbuminemia due to protein-calorie malnutrition (HCC) 07/22/2020   Lumbar compression fracture (HCC)    Multiple rib fractures 07/22/2019   Left rib details XR: left ribs demonstrate multiple remote rib   Pancytopenia (HCC)    Rhabdomyolysis 07/22/2020   Skin cancer    exciced 2017   Tinea versicolor 06/08/2013   Tobacco use disorder 07/22/2020   Tooth infection 04/25/2018   Trigger finger, acquired 11/18/2012   Ulnar tunnel syndrome of right wrist 11/08/2012     HOME MEDICATIONS  Facility Ordered Medications  Medication   [COMPLETED] sodium chloride 0.9 % bolus 1,000 mL   [COMPLETED] diltiazem (CARDIZEM) 1 mg/mL load via infusion 20 mg   And   diltiazem (CARDIZEM) 125 mg in dextrose 5% 125 mL (1 mg/mL) infusion   [COMPLETED] sodium chloride 0.9 % bolus 1,000 mL   PTA Medications  Medication Sig   methocarbamol (ROBAXIN) 500 MG tablet Take 1 tablet (500 mg total) by mouth every 6 (six) hours as needed for muscle spasms.   acetaminophen (TYLENOL) 500 MG tablet Take 1 tablet (500 mg total) by mouth every 8 (eight) hours as needed.   diltiazem (CARDIZEM SR) 60 MG 12 hr capsule Take 1 capsule (60 mg total)  by mouth every 12 (twelve) hours.   furosemide (LASIX) 20 MG tablet Take 1 tablet (20 mg total) by mouth daily.   metoprolol tartrate (LOPRESSOR) 25 MG tablet Take 1/2 tablet (12.5 mg total) by mouth 2 (two) times daily.   rosuvastatin (CRESTOR) 10 MG tablet Take 1 tablet (10 mg total) by mouth daily.   Magnesium Oxide 400 MG CAPS Take 1 capsule (400 mg total) by mouth 2 (two) times daily.    ferrous sulfate 325 (65 FE) MG tablet Take 1 tablet (325 mg total) by mouth daily with breakfast.   folic acid (FOLVITE) 1 MG tablet Take 1 tablet (1 mg total) by mouth daily.   rivaroxaban (XARELTO) 20 MG TABS tablet Take 1 tablet (20 mg) by mouth daily with supper.   budesonide-formoterol (SYMBICORT) 160-4.5 MCG/ACT inhaler Inhale 2 puffs into the lungs 2 (two) times daily.   tamsulosin (FLOMAX) 0.4 MG CAPS capsule Take 1 capsule (0.4 mg total) by mouth daily after supper.    ALLERGIES  Allergies  Allergen Reactions   Other Itching and Other (See Comments)    Seasonal allergies- Itchy eyes, runny nose, congestion   Poison Ivy Extract Rash    SOCIAL & SUBSTANCE USE HISTORY  Social History   Socioeconomic History   Marital status: Single    Spouse name: Not on file   Number of children: Not on file   Years of education: Not on file   Highest education level: Not on file  Occupational History   Occupation: unemployed  Tobacco Use   Smoking status: Never   Smokeless tobacco: Current    Types: Snuff   Tobacco comments:    Pt states do snuff when stressed had 1 can last 3 month//PL  Vaping Use   Vaping Use: Never used  Substance and Sexual Activity   Alcohol use: Yes    Alcohol/week: 43.0 standard drinks of alcohol    Types: 42 Cans of beer, 1 Standard drinks or equivalent per week   Drug use: Yes    Types: Marijuana   Sexual activity: Yes    Birth control/protection: None  Other Topics Concern   Not on file  Social History Narrative   Lives with a friend. Has children but hasn't met them in a while. He doesn't know where they live.    Social Determinants of Health   Financial Resource Strain: Not on file  Food Insecurity: Food Insecurity Present (10/10/2022)   Hunger Vital Sign    Worried About Running Out of Food in the Last Year: Often true    Ran Out of Food in the Last Year: Often true  Transportation Needs: Unmet Transportation Needs (10/10/2022)   PRAPARE -  Administrator, Civil Service (Medical): Yes    Lack of Transportation (Non-Medical): Yes  Physical Activity: Not on file  Stress: Not on file  Social Connections: Not on file   Social History   Tobacco Use  Smoking Status Never  Smokeless Tobacco Current   Types: Snuff  Tobacco Comments   Pt states do snuff when stressed had 1 can last 3 month//PL   Social History   Substance and Sexual Activity  Alcohol Use Yes   Alcohol/week: 43.0 standard drinks of alcohol   Types: 42 Cans of beer, 1 Standard drinks or equivalent per week   Social History   Substance and Sexual Activity  Drug Use Yes   Types: Marijuana    FAMILY HISTORY  Family History  Problem Relation Age  of Onset   Heart attack Father        died at 4 years   Dementia Father    Stroke Father    Cancer Sister    Colon cancer Neg Hx    Colon polyps Neg Hx    Esophageal cancer Neg Hx    Stomach cancer Neg Hx    Rectal cancer Neg Hx    Family Psychiatric History (if known):  denies  MENTAL STATUS EXAM (MSE)  Presentation  General Appearance:  Disheveled  Eye Contact: Good  Speech: Clear and Coherent  Speech Volume: Normal  Handedness: Right   Mood and Affect  Mood: Euthymic  Affect: Congruent   Thought Process  Thought Processes: Coherent  Descriptions of Associations: Intact  Orientation: Full (Time, Place and Person)  Thought Content: Logical  History of Schizophrenia/Schizoaffective disorder:No data recorded Duration of Psychotic Symptoms:No data recorded Hallucinations: none currently but endorsed paranoid delusions earlier. Ideas of Reference: None  Suicidal Thoughts: denies Homicidal Thoughts: denies  Sensorium  Memory: Adequate  Judgment: fair  Insight: Questionable   Executive Functions  Concentration: Good  Attention Span: Good  Recall: adequate  Fund of Knowledge: Good  Language: Good   Psychomotor Activity  Psychomotor  Activity: resting in bed  Assets  Assets: Communication Skills; Resilience   Sleep  Sleep:No data recorded  VITALS  Blood pressure 93/61, pulse (!) 128, temperature (!) 97.5 F (36.4 C), temperature source Oral, resp. rate 19, SpO2 100 %.  LABS  Admission on 12/02/2022  Component Date Value Ref Range Status   WBC 12/02/2022 6.1  4.0 - 10.5 K/uL Final   RBC 12/02/2022 3.64 (L)  4.22 - 5.81 MIL/uL Final   Hemoglobin 12/02/2022 11.8 (L)  13.0 - 17.0 g/dL Final   HCT 16/04/9603 36.6 (L)  39.0 - 52.0 % Final   MCV 12/02/2022 100.5 (H)  80.0 - 100.0 fL Final   MCH 12/02/2022 32.4  26.0 - 34.0 pg Final   MCHC 12/02/2022 32.2  30.0 - 36.0 g/dL Final   RDW 54/03/8118 19.6 (H)  11.5 - 15.5 % Final   Platelets 12/02/2022 252  150 - 400 K/uL Final   nRBC 12/02/2022 0.0  0.0 - 0.2 % Final   Neutrophils Relative % 12/02/2022 55  % Final   Neutro Abs 12/02/2022 3.3  1.7 - 7.7 K/uL Final   Lymphocytes Relative 12/02/2022 34  % Final   Lymphs Abs 12/02/2022 2.1  0.7 - 4.0 K/uL Final   Monocytes Relative 12/02/2022 10  % Final   Monocytes Absolute 12/02/2022 0.6  0.1 - 1.0 K/uL Final   Eosinophils Relative 12/02/2022 0  % Final   Eosinophils Absolute 12/02/2022 0.0  0.0 - 0.5 K/uL Final   Basophils Relative 12/02/2022 1  % Final   Basophils Absolute 12/02/2022 0.1  0.0 - 0.1 K/uL Final   Immature Granulocytes 12/02/2022 0  % Final   Abs Immature Granulocytes 12/02/2022 0.02  0.00 - 0.07 K/uL Final   Performed at [facility], [address] [place], [place] [address]   Troponin I (High Sensitivity) 12/02/2022 11  <18 ng/L Final   Comment: (NOTE) Elevated high sensitivity troponin I (hsTnI) values and significant  changes across serial measurements may suggest ACS but many other  chronic and acute conditions are known to elevate hsTnI results.  Refer to the "Links" section for chest pain algorithms and additional  guidance. Performed at [facility],  [address] [place], [place] [address]  Sodium 12/02/2022 131 (L)  135 - 145 mmol/L Final   Potassium 12/02/2022 4.4  3.5 - 5.1 mmol/L Final   HEMOLYSIS AT THIS LEVEL MAY AFFECT RESULT   Chloride 12/02/2022 99  98 - 111 mmol/L Final   CO2 12/02/2022 13 (L)  22 - 32 mmol/L Final   Glucose, Bld 12/02/2022 95  70 - 99 mg/dL Final   Glucose reference range applies only to samples taken after fasting for at least 8 hours.   BUN 12/02/2022 28 (H)  8 - 23 mg/dL Final   Creatinine, Ser 12/02/2022 1.38 (H)  0.61 - 1.24 mg/dL Final   Calcium 16/04/9603 9.0  8.9 - 10.3 mg/dL Final   Total Protein 54/03/8118 8.4 (H)  6.5 - 8.1 g/dL Final   Albumin 14/78/2956 4.1  3.5 - 5.0 g/dL Final   AST 21/30/8657 54 (H)  15 - 41 U/L Final   HEMOLYSIS AT THIS LEVEL MAY AFFECT RESULT   ALT 12/02/2022 21  0 - 44 U/L Final   HEMOLYSIS AT THIS LEVEL MAY AFFECT RESULT   Alkaline Phosphatase 12/02/2022 77  38 - 126 U/L Final   Total Bilirubin 12/02/2022 1.5 (H)  0.3 - 1.2 mg/dL Final   HEMOLYSIS AT THIS LEVEL MAY AFFECT RESULT   GFR, Estimated 12/02/2022 56 (L)  >60 mL/min Final   Comment: (NOTE) Calculated using the CKD-EPI Creatinine Equation (2021)    Anion gap 12/02/2022 19 (H)  5 - 15 Final   Performed at [facility], [address] [place], [place] [address]   Alcohol, Ethyl (B) 12/02/2022 <10  <10 mg/dL Final   Comment: (NOTE) Lowest detectable limit for serum alcohol is 10 mg/dL.  For medical purposes only. Performed at [facility], [address] [place], [place] [address]    B Natriuretic Peptide 12/02/2022 341.6 (H)  0.0 - 100.0 pg/mL Final   Performed at [facility], [address], [place], [place] [address]   Lactic Acid, Venous 12/02/2022 3.5 (HH)  0.5 - 1.9 mmol/L Final   Comment: CRITICAL RESULT CALLED TO, READ BACK BY AND VERIFIED WITH HARRIS,K @ 0528 12/02/22 BY CHILDRESS,E Performed at [facility],  [address] [place], [place] [address]     PSYCHIATRIC REVIEW OF SYSTEMS (ROS)  Additional findings:      Musculoskeletal: No abnormal movements observed      Gait & Station: Laying/Sitting      Pain Screening: Denies  RISK FORMULATION/ASSESSMENT  Is the patient experiencing any suicidal or homicidal ideations: No  Protective factors considered for safety management: domiciled, no access to firearms, willingness to accept help  Risk factors/concerns considered for safety management: alcoholism Substance abuse/dependence Age over 76 Isolation Male gender Unmarried  Is there a Astronomer plan with the patient and treatment team to minimize risk factors and promote protective factors: Yes Is crisis care placement or psychiatric hospitalization recommended: No     Based on my current evaluation and risk assessment, patient is determined at this time to be at: Low risk  *RISK ASSESSMENT Risk assessment is a dynamic process; it is possible that this patient's condition, and risk level, may change. This should be re-evaluated and managed over time as appropriate. Please re-consult psychiatric consult services if additional assistance is needed in terms of risk assessment and management. If your team decides to discharge this patient, please advise the patient how to best access emergency psychiatric services, or to call 911, if their condition  worsens or they feel unsafe in any way.   Kingsley Callander, DO Telepsychiatry Consult Services

## 2022-12-03 ENCOUNTER — Observation Stay (HOSPITAL_COMMUNITY): Payer: 59

## 2022-12-03 DIAGNOSIS — I639 Cerebral infarction, unspecified: Secondary | ICD-10-CM | POA: Insufficient documentation

## 2022-12-03 DIAGNOSIS — I4891 Unspecified atrial fibrillation: Secondary | ICD-10-CM | POA: Diagnosis not present

## 2022-12-03 LAB — COMPREHENSIVE METABOLIC PANEL WITH GFR
ALT: 17 U/L (ref 0–44)
AST: 29 U/L (ref 15–41)
Albumin: 3.4 g/dL — ABNORMAL LOW (ref 3.5–5.0)
Alkaline Phosphatase: 63 U/L (ref 38–126)
Anion gap: 7 (ref 5–15)
BUN: 20 mg/dL (ref 8–23)
CO2: 22 mmol/L (ref 22–32)
Calcium: 8.3 mg/dL — ABNORMAL LOW (ref 8.9–10.3)
Chloride: 104 mmol/L (ref 98–111)
Creatinine, Ser: 0.69 mg/dL (ref 0.61–1.24)
GFR, Estimated: >60 mL/min (ref >60)
Glucose, Bld: 90 mg/dL (ref 70–99)
Potassium: 3.3 mmol/L — ABNORMAL LOW (ref 3.5–5.1)
Sodium: 133 mmol/L — ABNORMAL LOW (ref 135–145)
Total Bilirubin: 1 mg/dL (ref 0.3–1.2)
Total Protein: 6.8 g/dL (ref 6.5–8.1)

## 2022-12-03 LAB — CBC
HCT: 29 % — ABNORMAL LOW (ref 39.0–52.0)
Hemoglobin: 9.7 g/dL — ABNORMAL LOW (ref 13.0–17.0)
MCH: 33.3 pg (ref 26.0–34.0)
MCHC: 33.4 g/dL (ref 30.0–36.0)
MCV: 99.7 fL (ref 80.0–100.0)
Platelets: 175 10*3/uL (ref 150–400)
RBC: 2.91 MIL/uL — ABNORMAL LOW (ref 4.22–5.81)
RDW: 20.2 % — ABNORMAL HIGH (ref 11.5–15.5)
WBC: 3.5 10*3/uL — ABNORMAL LOW (ref 4.0–10.5)
nRBC: 0 % (ref 0.0–0.2)

## 2022-12-03 LAB — MISC LABCORP TEST (SEND OUT): Labcorp test code: 83935

## 2022-12-03 LAB — RPR: RPR Ser Ql: NONREACTIVE

## 2022-12-03 MED ORDER — POTASSIUM CHLORIDE CRYS ER 20 MEQ PO TBCR
40.0000 meq | EXTENDED_RELEASE_TABLET | Freq: Once | ORAL | Status: AC
  Administered 2022-12-03: 40 meq via ORAL
  Filled 2022-12-03: qty 2

## 2022-12-03 MED ORDER — IOHEXOL 350 MG/ML SOLN
75.0000 mL | Freq: Once | INTRAVENOUS | Status: AC | PRN
  Administered 2022-12-03: 75 mL via INTRAVENOUS

## 2022-12-03 MED ORDER — LACTATED RINGERS IV BOLUS
500.0000 mL | Freq: Once | INTRAVENOUS | Status: AC
  Administered 2022-12-03: 500 mL via INTRAVENOUS

## 2022-12-03 MED ORDER — STROKE: EARLY STAGES OF RECOVERY BOOK
1.0000 | Freq: Once | Status: AC
  Administered 2022-12-04: 1
  Filled 2022-12-03: qty 1

## 2022-12-03 MED ORDER — SODIUM CHLORIDE (PF) 0.9 % IJ SOLN
INTRAMUSCULAR | Status: AC
  Filled 2022-12-03: qty 50

## 2022-12-03 MED ORDER — ENSURE ENLIVE PO LIQD
237.0000 mL | Freq: Two times a day (BID) | ORAL | Status: DC
  Administered 2022-12-04 – 2022-12-18 (×23): 237 mL via ORAL

## 2022-12-03 MED ORDER — LACTATED RINGERS IV BOLUS
250.0000 mL | Freq: Once | INTRAVENOUS | Status: AC
  Administered 2022-12-03: 250 mL via INTRAVENOUS

## 2022-12-03 NOTE — Plan of Care (Signed)
  Problem: Education: Goal: Knowledge of disease or condition will improve Outcome: Progressing   Problem: Health Behavior/Discharge Planning: Goal: Ability to safely manage health-related needs after discharge will improve Outcome: Progressing   Problem: Education: Goal: Knowledge of General Education information will improve Description: Including pain rating scale, medication(s)/side effects and non-pharmacologic comfort measures Outcome: Progressing   Problem: Health Behavior/Discharge Planning: Goal: Ability to manage health-related needs will improve Outcome: Progressing   Problem: Nutrition: Goal: Adequate nutrition will be maintained Outcome: Progressing   Problem: Activity: Goal: Ability to tolerate increased activity will improve Outcome: Adequate for Discharge   Problem: Activity: Goal: Risk for activity intolerance will decrease Outcome: Adequate for Discharge   Problem: Pain Managment: Goal: General experience of comfort will improve Outcome: Adequate for Discharge   Problem: Safety: Goal: Ability to remain free from injury will improve Outcome: Adequate for Discharge   Problem: Skin Integrity: Goal: Risk for impaired skin integrity will decrease Outcome: Adequate for Discharge

## 2022-12-03 NOTE — Progress Notes (Signed)
Initial Nutrition Assessment  DOCUMENTATION CODES:   Non-severe (moderate) malnutrition in context of chronic illness  INTERVENTION:  - Liberalize to Regular diet to promote intake due to malnutrition.  - Ensure Plus High Protein po BID, each supplement provides 350 kcal and 20 grams of protein. - Continue Multivitamin with minerals daily, folic acid, and thiamine for alcohol abuse.  - Monitor weight trends.    NUTRITION DIAGNOSIS:   Moderate Malnutrition related to chronic illness (etoh abuse) as evidenced by moderate muscle depletion, moderate fat depletion, percent weight loss (10.7% in less than 2 months).  GOAL:   Patient will meet greater than or equal to 90% of their needs  MONITOR:   PO intake, Supplement acceptance, Weight trends  REASON FOR ASSESSMENT:   Malnutrition Screening Tool    ASSESSMENT:   67 year old with PMH significant for alcohol abuse who was admitted for A-fib.   Patient reports a UBW of 175-185# and a lot of weight loss over the past few years.  Per EMR, weight has fluctuated up and down the past year between 133-155#. He has lost 16# or 10.7% in less than 2 months, which is significant for the time frame.   He admits his intake has been poor recently and he has only been eating around 1 meal a day. Notes he has a lot of teeth missing so his intake can be limited.   Current appetite is good and patient is documented to be consuming 100% of meals. Upset about being on a Heart healthy diet, discussed changing to a regular diet to promote intake and patient appreciative. He does not want to have a DYS 3 diet as he feels he can choose soft foods to eat. Encouraged patient to inform RN or MD if this needs to happen later in admission.  He is agreeable to receive Ensure during admission.   Medications reviewed and include: 325mg  ferrous sulfate, folic acid, Magnesium BID, MVI, Thiamine  Labs reviewed:  Na 133 K+ 3.3   NUTRITION - FOCUSED PHYSICAL  EXAM:  Flowsheet Row Most Recent Value  Orbital Region Moderate depletion  Upper Arm Region Moderate depletion  Thoracic and Lumbar Region Moderate depletion  Buccal Region Severe depletion  Temple Region Mild depletion  Clavicle Bone Region Moderate depletion  Clavicle and Acromion Bone Region Moderate depletion  Scapular Bone Region Unable to assess  Dorsal Hand Mild depletion  Patellar Region Moderate depletion  Anterior Thigh Region Moderate depletion  Posterior Calf Region Moderate depletion  Edema (RD Assessment) None  Hair Reviewed  Eyes Reviewed  Mouth Reviewed  [missing a lot of teeth]  Skin Reviewed  Nails Reviewed       Diet Order:   Diet Order             Diet regular Room service appropriate? Yes; Fluid consistency: Thin  Diet effective now                   EDUCATION NEEDS:  Education needs have been addressed  Skin:  Skin Assessment: Reviewed RN Assessment  Last BM:  5/16  Height:  Ht Readings from Last 1 Encounters:  12/02/22 5\' 9"  (1.753 m)   Weight:  Wt Readings from Last 1 Encounters:  12/02/22 60.4 kg    BMI:  Body mass index is 19.66 kg/m.  Estimated Nutritional Needs:  Kcal:  1800-2100 kcals Protein:  85-100 grams Fluid:  >/= 1.8L    Shelle Iron RD, LDN For contact information, refer to [facility].

## 2022-12-03 NOTE — Progress Notes (Signed)
   12/03/22 1510  Assess: MEWS Score  Temp 98.2 F (36.8 C)  BP (!) 74/64  MAP (mmHg) 69  Pulse Rate 78  Resp 18  SpO2 99 %  O2 Device Room Air  Assess: MEWS Score  MEWS Temp 0  MEWS Systolic 2  MEWS Pulse 0  MEWS RR 0  MEWS LOC 0  MEWS Score 2  MEWS Score Color Yellow  Assess: if the MEWS score is Yellow or Red  Were vital signs taken at a resting state? Yes  Focused Assessment No change from prior assessment  Does the patient meet 2 or more of the SIRS criteria? No  MEWS guidelines implemented  Yes, yellow  Treat  MEWS Interventions Considered administering scheduled or prn medications/treatments as ordered  Take Vital Signs  Increase Vital Sign Frequency  Yellow: Q2hr x1, continue Q4hrs until patient remains green for 12hrs  Escalate  MEWS: Escalate Yellow: Discuss with charge nurse and consider notifying provider and/or RRT  Notify: Charge Nurse/RN  Name of Charge Nurse/RN Notified Shearon Stalls  Provider Notification  Provider Name/Title Hartley Barefoot  Date Provider Notified 12/03/22  Time Provider Notified 1512  Method of Notification Page  Notification Reason Other (Comment) (yellow mews)  Provider response See new orders  Date of Provider Response 12/03/22  Time of Provider Response 1514  Assess: SIRS CRITERIA  SIRS Temperature  0  SIRS Pulse 0  SIRS Respirations  0  SIRS WBC 0  SIRS Score Sum  0

## 2022-12-03 NOTE — Consult Note (Signed)
Neurology Consultation  Reason for Consult: Punctate strokes seen on MRI Referring Physician: Dr. Sunnie Nielsen  CC: Confusion  History is obtained from: Patient and chart  HPI: Anthony [name] is a 67 y.o. male with history of alcohol abuse, anxiety, depression, PE on Xarelto, hydrocephalus corrected surgically, hypertension, multiple spinal compression fractures, left rib fractures, rhabdomyolysis and skin cancer who was brought to the ED for altered mental status.  Brain MRI was performed, and he was found to have a punctate infarct in the left superior frontal gyrus.  He verbalizes no stroke symptoms and states that he has had some shortness of breath recently as well as feelings of confusion.  Patient states that he knows he is in the hospital but sometimes feels as though he is outside and asks if the floor has been turned off.  He is able to state his correct age as 11 but later states that he is actually 67 years old because he has lived such a full life each year counts twice with much.  He minimizes having any hallucinations, stating he is good at noticing things other people over look   LKW: Uncertain TNK given?: no, completed stroke on MRI IR Thrombectomy? No, exam not consistent with LVO Modified Rankin Scale: 2-Slight disability-UNABLE to perform all activities but does not need assistance  ROS: A complete ROS was performed and is negative except as noted in the HPI.   Past Medical History:  Diagnosis Date   Actinic keratosis 11/27/2016   Alcohol abuse with alcohol-induced mood disorder (HCC) 07/18/2018   Alcohol use disorder, severe, dependence (HCC) 09/16/2006   05/22/2015 Argumentative, belligerent and verbally abusive to staff per his note from [place]    Alcoholism [facility])    Anxiety state 04/25/2018   Aortic atherosclerosis (HCC) 08/27/2022   Chronic low back pain 09/16/2006   Followed by NS.  Per his chart from [place], he fell off two storyhouse roof while cleaning  his brother's gutters and sustained compression fracture of L3, L4 and L5 and fracture of right transverse process at L3 and nondisplaced fracture of left glenoid rim many years ago. He had multiple imaging in [place] including Lumbar MRI in 2016 which showed moderate to severe spinal canal stenos   Delirium tremens (HCC) 09/01/2017   Depression    Dry eye 11/23/2016   Foreign body in middle portion of esophagus 05/19/2022   Hiatal hernia 09/14/2016   Status Collis gastroplasty and Nissen's fundoplication on 04/29/2015 in [place]   History of delirium tremons 07/22/2020   DTs during 10/2016 08/2017. And 12/2017 admissions    History of pulmonary embolus (PE) 11/02/2016   Unprovoked 11/01/2016. Xarelto from 11/01/16 to 09/08/2017.   Hydrocele    Surgically corrected   Hypertension    Hypoalbuminemia due to protein-calorie malnutrition (HCC) 07/22/2020   Lumbar compression fracture (HCC)    Multiple rib fractures 07/22/2019   Left rib details XR: left ribs demonstrate multiple remote rib   Pancytopenia (HCC)    Rhabdomyolysis 07/22/2020   Skin cancer    exciced 2017   Tinea versicolor 06/08/2013   Tobacco use disorder 07/22/2020   Tooth infection 04/25/2018   Trigger finger, acquired 11/18/2012   Ulnar tunnel syndrome of right wrist 11/08/2012     Family History  Problem Relation Age of Onset   Heart attack Father        died at 69 years   Dementia Father    Stroke Father    Cancer Sister    Colon  cancer Neg Hx    Colon polyps Neg Hx    Esophageal cancer Neg Hx    Stomach cancer Neg Hx    Rectal cancer Neg Hx      Social History:   reports that he has never smoked. His smokeless tobacco use includes snuff. He reports current alcohol use of about 43.0 standard drinks of alcohol per week. He reports current drug use. Drug: Marijuana.  Medications  Current Facility-Administered Medications:    acetaminophen (TYLENOL) tablet 650 mg, 650 mg, Oral, Q6H PRN, 650 mg at  12/02/22 1548 **OR** acetaminophen (TYLENOL) suppository 650 mg, 650 mg, Rectal, Q6H PRN, Bobette Mo, MD   diltiazem (CARDIZEM SR) 12 hr capsule 60 mg, 60 mg, Oral, Q12H, Bobette Mo, MD, 60 mg at 12/03/22 7062   ferrous sulfate tablet 325 mg, 325 mg, Oral, Q breakfast, Bobette Mo, MD, 325 mg at 12/03/22 0818   folic acid (FOLVITE) tablet 1 mg, 1 mg, Oral, Daily, Bobette Mo, MD, 1 mg at 12/03/22 0818   LORazepam (ATIVAN) tablet 1-4 mg, 1-4 mg, Oral, Q1H PRN **OR** LORazepam (ATIVAN) injection 1-4 mg, 1-4 mg, Intravenous, Q1H PRN, Bobette Mo, MD   magnesium oxide (MAG-OX) tablet 400 mg, 400 mg, Oral, BID, Bobette Mo, MD, 400 mg at 12/03/22 0818   methocarbamol (ROBAXIN) tablet 500 mg, 500 mg, Oral, Q6H PRN, Bobette Mo, MD, 500 mg at 12/02/22 1548   metoprolol tartrate (LOPRESSOR) tablet 12.5 mg, 12.5 mg, Oral, BID, Bobette Mo, MD, 12.5 mg at 12/03/22 3762   mometasone-formoterol (DULERA) 200-5 MCG/ACT inhaler 2 puff, 2 puff, Inhalation, BID, Bobette Mo, MD, 2 puff at 12/03/22 0850   multivitamin with minerals tablet 1 tablet, 1 tablet, Oral, Daily, Bobette Mo, MD, 1 tablet at 12/03/22 0818   ondansetron (ZOFRAN) tablet 4 mg, 4 mg, Oral, Q6H PRN **OR** ondansetron (ZOFRAN) injection 4 mg, 4 mg, Intravenous, Q6H PRN, Bobette Mo, MD   potassium chloride SA (KLOR-CON M) CR tablet 40 mEq, 40 mEq, Oral, Once, Regalado, Belkys A, MD   rivaroxaban (XARELTO) tablet 20 mg, 20 mg, Oral, Q supper, Bobette Mo, MD, 20 mg at 12/02/22 2011   rosuvastatin (CRESTOR) tablet 10 mg, 10 mg, Oral, Daily, Bobette Mo, MD, 10 mg at 12/03/22 0818   tamsulosin [facility]) capsule 0.4 mg, 0.4 mg, Oral, QPC supper, Bobette Mo, MD, 0.4 mg at 12/02/22 2152   thiamine (VITAMIN B1) tablet 100 mg, 100 mg, Oral, Daily, 100 mg at 12/03/22 0818 **OR** thiamine (VITAMIN B1) injection 100 mg, 100 mg, Intravenous, Daily,  Bobette Mo, MD   Exam: Current vital signs: BP 105/73   Pulse (!) 108   Temp 97.9 F (36.6 C) (Oral)   Resp 17   Ht 5\' 9"  (1.753 m)   Wt 60.4 kg   SpO2 98%   BMI 19.66 kg/m  Vital signs in last 24 hours: Temp:  [97.6 F (36.4 C)-98.5 F (36.9 C)] 97.9 F (36.6 C) (05/16 0530) Pulse Rate:  [53-155] 108 (05/16 0811) Resp:  [10-28] 17 (05/16 0530) BP: (81-169)/(60-149) 105/73 (05/16 0811) SpO2:  [97 %-100 %] 98 % (05/16 0850)  GENERAL: Awake, alert, in no acute distress Psych: Affect appropriate for situation, patient is calm and cooperative with examination, speaks of his past experiences and converses easily Head: Normocephalic and atraumatic, without obvious abnormality EENT: Normal conjunctivae, moist mucous membranes, no OP obstruction LUNGS: Normal respiratory effort. Non-labored breathing on room air  Extremities: warm, well perfused, without obvious deformity  NEURO:  Mental Status: Awake, alert, and oriented to person, place, time, and situation. He is able to provide some history of present illness. Speech/Language: speech is clear and fluent.   Fluency, and comprehension intact without aphasia  No neglect is noted Cranial Nerves:  II: PERRL 4 mm/brisk. visual fields full.  III, IV, VI: EOMI. Lid elevation symmetric and full.  V: Sensation is intact to light touch and symmetrical to face.  VII: Face is symmetric resting and smiling.  VIII: Hearing intact to voice IX, X: Phonation normal.  XI: Normal sternocleidomastoid and trapezius muscle strength XII: Tongue protrudes midline without fasciculations.   Motor: 5/5 strength is all muscle groups.  Tone is normal. Bulk is normal.  Sensation: Intact to light touch bilaterally in all four extremities. No extinction to DSS present.  Coordination: FTN intact bilaterally. No pronator drift.  Gait: Deferred  NIHSS: 1a Level of Conscious.: 0 1b LOC Questions: 0 1c LOC Commands: 0 2 Best Gaze: 0 3 Visual:  0 4 Facial Palsy: 0 5a Motor Arm - left: 0 5b Motor Arm - Right: 0 6a Motor Leg - Left: 0 6b Motor Leg - Right: 0 7 Limb Ataxia: 0 8 Sensory: 0 9 Best Language: 0 10 Dysarthria: 0 11 Extinct. and Inatten.: 0 TOTAL: 0   Labs I have reviewed labs in epic and the results pertinent to this consultation are:   CBC    Component Value Date/Time   WBC 3.5 (L) 12/03/2022 0420   RBC 2.91 (L) 12/03/2022 0420   HGB 9.7 (L) 12/03/2022 0420   HGB 11.8 (L) 10/16/2020 1203   HCT 29.0 (L) 12/03/2022 0420   HCT 37.7 10/16/2020 1203   PLT 175 12/03/2022 0420   PLT 273 10/16/2020 1203   MCV 99.7 12/03/2022 0420   MCV 100 (H) 10/16/2020 1203   MCH 33.3 12/03/2022 0420   MCHC 33.4 12/03/2022 0420   RDW 20.2 (H) 12/03/2022 0420   RDW 12.8 10/16/2020 1203   LYMPHSABS 2.1 12/02/2022 0409   LYMPHSABS 2.2 11/27/2016 1509   MONOABS 0.6 12/02/2022 0409   EOSABS 0.0 12/02/2022 0409   EOSABS 0.4 11/27/2016 1509   BASOSABS 0.1 12/02/2022 0409   BASOSABS 0.1 11/27/2016 1509    CMP     Component Value Date/Time   NA 133 (L) 12/03/2022 0733   NA 138 11/27/2016 1509   K 3.3 (L) 12/03/2022 0733   CL 104 12/03/2022 0733   CO2 22 12/03/2022 0733   GLUCOSE 90 12/03/2022 0733   BUN 20 12/03/2022 0733   BUN 9 11/27/2016 1509   CREATININE 0.69 12/03/2022 0733   CREATININE 0.78 08/10/2013 1630   CALCIUM 8.3 (L) 12/03/2022 0733   PROT 6.8 12/03/2022 0733   PROT 6.8 11/27/2016 1509   ALBUMIN 3.4 (L) 12/03/2022 0733   ALBUMIN 4.1 11/27/2016 1509   AST 29 12/03/2022 0733   ALT 17 12/03/2022 0733   ALKPHOS 63 12/03/2022 0733   BILITOT 1.0 12/03/2022 0733   BILITOT 0.3 11/27/2016 1509   GFRNONAA >60 12/03/2022 0733   GFRAA >60 07/20/2018 0524    Lipid Panel     Component Value Date/Time   CHOL 164 10/05/2020 0033   TRIG 125 10/05/2020 0033   HDL 52 10/05/2020 0033   CHOLHDL 3.2 10/05/2020 0033   VLDL 25 10/05/2020 0033   LDLCALC 87 10/05/2020 0033   Lab Results  Component Value Date    HGBA1C 5.2 11/01/2016  Imaging I have reviewed the images obtained:  MRI examination of the brain punctate acute cortical/subcortical infarct in left superior frontal gyrus, questionable chronic atrophy of cerebellar vermis  CTA head and neck pending  Assessment: 67 year old patient with the above past medical history presented with altered mental status.  On admission, he verbalized some paranoid ideations of people trying to break into his house and currently makes some odd verbalizations about the floor being turned off, being 67 years old and sometimes thinking he is outside.  However, he is able to state where he is month and year and general situation.  Patient has long history of alcohol abuse and is currently on CIWA protocol with appropriate folate and thiamine supplementation.  He verbalizes no stroke symptoms, and punctate stroke seen on MRI is incidental.  He did have atrial fibrillation with RVR on admission which is now resolved.  Will complete full stroke workup with lipid panel, hemoglobin A1c, CTA head and neck.  No need for echocardiogram as patient is already anticoagulated due to A-fib.  Impression:  Incidental punctate acute ischemic stroke in patient with atrial fibrillation, likely embolic in etiology versus small vessel disease Known alcohol withdrawal hallucinations Possible confabulation versus baseline personality  Recommendations: # Incidental punctate stroke workup - Admit for stroke workup - Permissive HTN x48 hrs goal BP <220/110. PRN labetalol or hydralazine if BP above these parameters. Avoid oral antihypertensives. - CTA head and neck - No need for echocardiogram as patient is already intact coagulated for A-fib - Check lipid panel and adjust statin per guidelines (goal LDL less than 70, increase Crestor if not meeting goal) - Goal A1c less than 7% - Fully anticoagulated with Xarelto, risks of holding anticoagulation outweigh benefits of holding  anticoagulation given how small the stroke is - q4 hr neuro checks - STAT head CT for any change in neuro exam - Tele - PT/OT/SLP - Stroke education - Amb referral to neurology upon discharge   - Continue CIWA protocol - Continue thiamine and folate supplementation - Recommendations conveyed to primary team via secure chat, neurology will follow-up CTA head and neck but otherwise we will be available as needed going forward, please reach out if any additional questions or concerns arise  Addendum: CTA  1. No emergent arterial finding. 2. Atherosclerosis without flow limiting stenosis of major arteries in the head and neck. 3. 2 mm left MCA bifurcation aneurysm.  No change in recommendations as above    Pt seen by NP/Neuro and later by MD. Note/plan to be edited by MD as needed.  Cortney E Ernestina Columbia , MSN, AGACNP-BC [facility] See Amion for schedule and pager information 12/03/2022 11:21 AM   Attending Neurologist's note:  I personally saw this patient, gathering history, performing a full neurologic examination, reviewing relevant labs, personally reviewing relevant imaging including MRI brain, and formulated the assessment and plan, adding the note above for completeness and clarity to accurately reflect my thoughts   Brooke Dare MD-PhD [facility] [phone]  Available 7 AM to 7 PM, outside these hours please contact Neurologist on call listed on AMION

## 2022-12-03 NOTE — Progress Notes (Signed)
PROGRESS NOTE    Anthony [name]  ZOX:[id number] DOB: 03/13/1956 DOA: 12/02/2022 PCP: Pcp, No   Brief Narrative: 67 year old with past medical history significant for actinic keratoses, alcohol abuse, alcohol withdrawal, anxiety, depression, hiatal hernia, pulmonary embolism, surgically corrected hydrocephalus, hypertension, hypokalemia, lumbar compression fracture, pancytopenia, rhabdomyolysis, tobacco use disorder, who presented to the ED brought by [facility] due to altered mental status.  Patient has not been taking his medications.  He was stating that there was a religious group outside of his window and people trying to break into his home.  He drinks 6-12 beers daily but have not been drinking for the last 2 days.  Evaluation in the ED he was found to have A-fib with RVR.  He was a started on Cardizem drip and subsequently transitioned to oral Cardizem. MRI was positive for a small stroke.  Neurology has been consulted. Patient was also evaluated by psych who recommended medical evaluation for hallucination but likely could be related to alcoholic hallucinations.  No inpatient facility required.   Assessment & Plan:   Principal Problem:   Atrial fibrillation with RVR (HCC) Active Problems:   HTN (hypertension)   Paroxysmal atrial fibrillation (HCC)   Hypomagnesemia   Dyslipidemia   Macrocytic anemia   Alcohol abuse with alcohol-induced mood disorder (HCC)   Tobacco use disorder   Hyponatremia   Aortic atherosclerosis (HCC)   Abnormal LFTs   Stroke (cerebrum) (HCC)  1-Paroxysmal A-fib with RVR: -Continue with Xarelto -Initially on Cardizem drip and currently transition to oral Cardizem and twice daily -Continue with metoprolol  2-Hallucination: Could be in the setting of alcohol used. Acute metabolic encephalopathy -Evaluated by psych does not require inpatient hospitalization. -Recommended ammonia, EEG, MRI: Ammonia negative EEG evidence of encephalopathy.  MRI with a small acute  stroke per neurology would not explain symptoms.  3-Alcohol abuse with alcohol-induced mood disorder: Patient presented with probably alcoholic hallucination, chronic alcoholic encephalopathy Continue with CIWA Continue with folic acid and thiamine  4-Abnormal liver function test in the setting of alcohol use: Follow trend Normalized.   5-incidental acute small stroke: Acute punctate cortical/subcortical infarct in the left superior frontal gyrus -Okay to continue with Xarelto, discussed with neurology -Neurology has been consulted plan to proceed with CT angio head and neck   Hypertension: Continue with metoprolol  Hyponatremia: in setting alcohol use. Improving.   Hypomagnesemia: Replete IV.   Dyslipidemia Aortic atherosclerosis (HCC) Continue rosuvastatin    Macrocytic anemia Monitor hematocrit and hemoglobin. Nutritional supplementation.   Tobacco use disorder Uses smokeless tobacco. Cessation advised.      Estimated body mass index is 19.66 kg/m as calculated from the following:   Height as of this encounter: 5\' 9"  (1.753 m).   Weight as of this encounter: 60.4 kg.   DVT prophylaxis: Xarelto Code Status: Full code Family Communication: Care discussed with patient Disposition Plan:  Status is: Observation The patient remains OBS appropriate and will d/c before 2 midnights.    Consultants:  Psych Neurology   Procedures:    Antimicrobials:    Subjective: He is alert, tangential answering questions.   Objective: Vitals:   12/03/22 0500 12/03/22 0530 12/03/22 0811 12/03/22 0850  BP:  107/76 105/73   Pulse: 83 95 (!) 108   Resp:  17    Temp:  97.9 F (36.6 C)    TempSrc:  Oral    SpO2: 100% 100%  98%  Weight:      Height:        Intake/Output  Summary (Last 24 hours) at 12/03/2022 1252 Last data filed at 12/03/2022 6045 Gross per 24 hour  Intake 368.51 ml  Output 845 ml  Net -476.49 ml   Filed Weights   12/02/22 0853  Weight: 60.4 kg     Examination:  General exam: Appears calm and comfortable  Respiratory system: Clear to auscultation. Respiratory effort normal. Cardiovascular system: S1 & S2 heard, RRR.  Gastrointestinal system: Abdomen is nondistended, soft and nontender. Central nervous system: Alert and oriented.  Extremities: Symmetric 5 x 5 power.   Data Reviewed: I have personally reviewed following labs and imaging studies  CBC: Recent Labs  Lab 12/02/22 0409 12/03/22 0420  WBC 6.1 3.5*  NEUTROABS 3.3  --   HGB 11.8* 9.7*  HCT 36.6* 29.0*  MCV 100.5* 99.7  PLT 252 175   Basic Metabolic Panel: Recent Labs  Lab 12/02/22 0409 12/03/22 0733  NA 131* 133*  K 4.4 3.3*  CL 99 104  CO2 13* 22  GLUCOSE 95 90  BUN 28* 20  CREATININE 1.38* 0.69  CALCIUM 9.0 8.3*  MG 1.6*  --   PHOS 5.1*  --    GFR: Estimated Creatinine Clearance: 76.5 mL/min (by C-G formula based on SCr of 0.69 mg/dL). Liver Function Tests: Recent Labs  Lab 12/02/22 0409 12/03/22 0733  AST 54* 29  ALT 21 17  ALKPHOS 77 63  BILITOT 1.5* 1.0  PROT 8.4* 6.8  ALBUMIN 4.1 3.4*   No results for input(s): "LIPASE", "AMYLASE" in the last 168 hours. Recent Labs  Lab 12/02/22 1027  AMMONIA 11   Coagulation Profile: No results for input(s): "INR", "PROTIME" in the last 168 hours. Cardiac Enzymes: No results for input(s): "CKTOTAL", "CKMB", "CKMBINDEX", "TROPONINI" in the last 168 hours. BNP (last 3 results) No results for input(s): "PROBNP" in the last 8760 hours. HbA1C: No results for input(s): "HGBA1C" in the last 72 hours. CBG: No results for input(s): "GLUCAP" in the last 168 hours. Lipid Profile: No results for input(s): "CHOL", "HDL", "LDLCALC", "TRIG", "CHOLHDL", "LDLDIRECT" in the last 72 hours. Thyroid Function Tests: Recent Labs    12/02/22 1027  TSH 3.074   Anemia Panel: Recent Labs    12/02/22 1027  VITAMINB12 503   Sepsis Labs: Recent Labs  Lab 12/02/22 0450 12/02/22 0858  LATICACIDVEN  3.5* 1.2    No results found for this or any previous visit (from the past 240 hour(s)).       Radiology Studies: MR BRAIN WO CONTRAST  Result Date: 12/03/2022 CLINICAL DATA:  67 year old male altered mental status. EXAM: MRI HEAD WITHOUT CONTRAST TECHNIQUE: Multiplanar, multiecho pulse sequences of the brain and surrounding structures were obtained without intravenous contrast. COMPARISON:  Brain MRI 08/16/2020.  Head CT 10/06/2022. FINDINGS: Brain: Tiny cortical or subcortical focus of diffusion restriction at the left superior frontal gyrus series 5, image 41, correlated on coronal series 13, image 12 DWI. No T2 or FLAIR hyperintensity. No other diffusion restriction. No midline shift, mass effect, evidence of mass lesion, ventriculomegaly, extra-axial collection or acute intracranial hemorrhage. Cervicomedullary junction and pituitary are within normal limits. Cerebral volume is stable since 2022. Outside of the acute finding stable minimal to mild nonspecific white matter T2 and FLAIR hyperintensity in both hemispheres. No cortical encephalomalacia. No chronic cerebral blood products on SWI. However, there is questionable chronic atrophy of the cerebellar vermis. Vascular: Major intracranial vascular flow voids are preserved, stable since 2022. Skull and upper cervical spine: Cervical spine degeneration including C3-C4 spondylolisthesis and mild spinal  stenosis on series 7, image 12. Visualized bone marrow signal is within normal limits. Sinuses/Orbits: Stable, negative. Other: Mastoids are clear. Visible internal auditory structures appear normal. Negative visible scalp and face. IMPRESSION: 1. Positive for a punctate Acute cortical/subcortical Infarct in the Left superior frontal gyrus (motor/pre motor area). No associated hemorrhage or mass effect. Query right side weakness. 2. Otherwise stable since 2022 and largely negative for age noncontrast MRI appearance of the brain; questionable chronic  atrophy of the cerebellar vermis. 3. 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 Electronically Signed   By: Odessa Fleming M.D.   On: 12/03/2022 06:35   EEG adult  Result Date: 12/02/2022 Charlsie Quest, MD     12/02/2022  5:58 PM Patient Name: SIVA LIVAUDAIS MRN: [id number] Epilepsy Attending: Charlsie Quest Referring Physician/Provider: Bobette Mo, MD Date: 12/02/2022 Duration: 24.30 mins Patient history: 67yo m with ams getting eeg to evaluate for seizure Level of alertness: Awake AEDs during EEG study: None Technical aspects: This EEG study was done with scalp electrodes positioned according to the 10-20 International system of electrode placement. Electrical activity was reviewed with band pass filter of 1-70Hz , sensitivity of 7 uV/mm, display speed of 40mm/sec with a 60Hz  notched filter applied as appropriate. EEG data were recorded continuously and digitally stored.  Video monitoring was available and reviewed as appropriate. Description: EEG showed continuous generalized 3 to 6 Hz theta-delta slowing. Hyperventilation and photic stimulation were not performed.   Of note, study was technically difficult due to significant electrode artifact secondary to matted hair  ABNORMALITY - Continuous slow, generalized IMPRESSION: This technically difficult study is  suggestive of moderate diffuse encephalopathy, nonspecific etiology. No seizures or epileptiform discharges were seen throughout the recording. Charlsie Quest   DG Skull 1-3 Views  Result Date: 12/02/2022 CLINICAL DATA:  MRI clearance EXAM: SKULL - 1-3 VIEW COMPARISON:  CT head 10/06/2022 FINDINGS: There is no evidence of skull fracture or other focal bone lesions. No metallic foreign bodies are demonstrated. Paranasal sinuses are clear. IMPRESSION: No metallic foreign bodies are demonstrated. Electronically Signed   By: Burman Nieves M.D.   On: 12/02/2022 17:25   DG Abd 1 View  Result Date:  12/02/2022 CLINICAL DATA:  Metal screening prior MRI. EXAM: ABDOMEN - 1 VIEW COMPARISON:  None Available. FINDINGS: There surgical clips and staple line seen in the left upper quadrant. There is likely pen external artifact overlying the right hip. There are vascular calcifications in the pelvis. Bowel-gas pattern is nonobstructive. No fractures are seen. IMPRESSION: 1.  Surgical clips and staple line in the left upper quadrant. 2. Pen artifact overlying the right hip. Please correlate clinically. Electronically Signed   By: Darliss Cheney M.D.   On: 12/02/2022 17:25   DG Chest 2 View  Result Date: 12/02/2022 CLINICAL DATA:  67 year old male with shortness of breath. Atrial fibrillation. EXAM: CHEST - 2 VIEW COMPARISON:  Portable chest 10/10/2022 and earlier. FINDINGS: Semi upright AP and lateral views of the chest at 0440 hours. Lung volumes and mediastinal contours are stable and within normal limits. No cardiomegaly. Visualized tracheal air column is within normal limits. No pneumothorax, pulmonary edema, pleural effusion or confluent lung opacity. Chronic distal right clavicle deformity. No acute osseous abnormality identified. Epigastric surgical clips are chronic. Negative visible bowel gas. IMPRESSION: No acute cardiopulmonary abnormality. Electronically Signed   By: Odessa Fleming M.D.   On: 12/02/2022 05:37        Scheduled Meds:  [  START ON 12/04/2022]  stroke: early stages of recovery book  1 each Does not apply Once   diltiazem  60 mg Oral Q12H   feeding supplement  237 mL Oral BID BM   ferrous sulfate  325 mg Oral Q breakfast   folic acid  1 mg Oral Daily   magnesium oxide  400 mg Oral BID   metoprolol tartrate  12.5 mg Oral BID   mometasone-formoterol  2 puff Inhalation BID   multivitamin with minerals  1 tablet Oral Daily   rivaroxaban  20 mg Oral Q supper   rosuvastatin  10 mg Oral Daily   tamsulosin  0.4 mg Oral QPC supper   thiamine  100 mg Oral Daily   Or   thiamine  100 mg  Intravenous Daily   Continuous Infusions:   LOS: 0 days    Time spent: 35 minutes    Raghad Lorenz A Donalyn Schneeberger, MD [facility]   If 7PM-7AM, please contact night-coverage [email or web address]  12/03/2022, 12:52 PM

## 2022-12-04 DIAGNOSIS — I639 Cerebral infarction, unspecified: Secondary | ICD-10-CM | POA: Diagnosis not present

## 2022-12-04 DIAGNOSIS — F1014 Alcohol abuse with alcohol-induced mood disorder: Secondary | ICD-10-CM

## 2022-12-04 DIAGNOSIS — I4891 Unspecified atrial fibrillation: Secondary | ICD-10-CM | POA: Diagnosis not present

## 2022-12-04 LAB — CBC
HCT: 28.6 % — ABNORMAL LOW (ref 39.0–52.0)
Hemoglobin: 9.1 g/dL — ABNORMAL LOW (ref 13.0–17.0)
MCH: 33.2 pg (ref 26.0–34.0)
MCHC: 31.8 g/dL (ref 30.0–36.0)
MCV: 104.4 fL — ABNORMAL HIGH (ref 80.0–100.0)
Platelets: 137 10*3/uL — ABNORMAL LOW (ref 150–400)
RBC: 2.74 MIL/uL — ABNORMAL LOW (ref 4.22–5.81)
RDW: 20.8 % — ABNORMAL HIGH (ref 11.5–15.5)
WBC: 4.7 10*3/uL (ref 4.0–10.5)
nRBC: 0 % (ref 0.0–0.2)

## 2022-12-04 LAB — LIPID PANEL
Cholesterol: 121 mg/dL (ref 0–200)
HDL: 60 mg/dL (ref >40)
LDL Cholesterol: 48 mg/dL (ref 0–99)
Total CHOL/HDL Ratio: 2 RATIO
Triglycerides: 63 mg/dL (ref <150)
VLDL: 13 mg/dL (ref 0–40)

## 2022-12-04 LAB — BASIC METABOLIC PANEL
Anion gap: 7 (ref 5–15)
BUN: 21 mg/dL (ref 8–23)
CO2: 23 mmol/L (ref 22–32)
Calcium: 8.1 mg/dL — ABNORMAL LOW (ref 8.9–10.3)
Chloride: 105 mmol/L (ref 98–111)
Creatinine, Ser: 0.87 mg/dL (ref 0.61–1.24)
GFR, Estimated: >60 mL/min (ref >60)
Glucose, Bld: 99 mg/dL (ref 70–99)
Potassium: 4.9 mmol/L (ref 3.5–5.1)
Sodium: 135 mmol/L (ref 135–145)

## 2022-12-04 LAB — MAGNESIUM: Magnesium: 1.4 mg/dL — ABNORMAL LOW (ref 1.7–2.4)

## 2022-12-04 LAB — N-METHYL-D-ASPARTATE RECPT.IGG: N-methyl-D-Aspartate Recpt.IgG: NEGATIVE

## 2022-12-04 LAB — VITAMIN D 25 HYDROXY (VIT D DEFICIENCY, FRACTURES)

## 2022-12-04 LAB — HEMOGLOBIN A1C
Hgb A1c MFr Bld: 4.9 % (ref 4.8–5.6)
Mean Plasma Glucose: 93.93 mg/dL

## 2022-12-04 MED ORDER — MAGNESIUM SULFATE 2 GM/50ML IV SOLN
2.0000 g | Freq: Once | INTRAVENOUS | Status: AC
  Administered 2022-12-04: 2 g via INTRAVENOUS
  Filled 2022-12-04: qty 50

## 2022-12-04 MED ORDER — METOPROLOL TARTRATE 25 MG PO TABS
12.5000 mg | ORAL_TABLET | Freq: Two times a day (BID) | ORAL | Status: DC
  Administered 2022-12-04: 12.5 mg via ORAL
  Filled 2022-12-04: qty 1

## 2022-12-04 MED ORDER — METHOCARBAMOL 500 MG PO TABS
500.0000 mg | ORAL_TABLET | Freq: Three times a day (TID) | ORAL | Status: DC | PRN
  Administered 2022-12-05 – 2022-12-06 (×3): 500 mg via ORAL
  Filled 2022-12-04 (×4): qty 1

## 2022-12-04 MED ORDER — METHOCARBAMOL 500 MG PO TABS: 500.0000 mg | ORAL_TABLET | Freq: Four times a day (QID) | ORAL | Status: DC | PRN

## 2022-12-04 MED ORDER — METOPROLOL TARTRATE 25 MG PO TABS
25.0000 mg | ORAL_TABLET | Freq: Two times a day (BID) | ORAL | Status: DC
  Administered 2022-12-04 – 2022-12-07 (×7): 25 mg via ORAL
  Filled 2022-12-04 (×7): qty 1

## 2022-12-04 NOTE — Consult Note (Addendum)
Cardiology Consultation   Patient ID: TIMTOHY STILLWAGON MRN: [id number]; DOB: 1956-04-08  Admit date: 12/02/2022 Date of Consult: 12/04/2022  PCP:  Aviva Kluver   [facility] Providers Cardiologist:  Verne Carrow, MD   {   Patient Profile:   My A Pack is a 67 y.o. male with a hx of chronic diastolic CHF, paroxysmal A-fib, unprovoked PE, hypertension, chronic back pain, alcohol abuse with prior DTs, noncompliance, homelessness, paroxysmal A-fib who is being seen 12/04/2022 for the evaluation of A-fib RVR and hypotension at the request of Dr. Sunnie Nielsen.  History of Present Illness:   Mr. Yildiz has been seen by cardiology during prior admissions. Patient is alert and oriented x 3, however some confusion during interview. Most history obtained from chart review. Patient reports he goes from shelters and may have a room right now. He has not been taking his cardiac mediations. Once he leaves the hospital, meds are normally stolen or lost.   Patient was first diagnosed with A-fib during admission for alcohol withdrawal in January 2022.  Echo at that time showed EF of 55 to 60% with no wall motion abnormalities, grade 2 diastolic dysfunction.  He was seen later that month for repeat admission for recurrent A-fib with RVR in the setting of severe sepsis.  He was started on amiodarone and Xarelto was continued.  However, then he was transition to Lovenox due to concern for poor p.o. intake.  He converted back to sinus rhythm and was discharged on oral amiodarone, Toprol, and Xarelto.  Patient was admitted March 2022 for urosepsis and shortness of breath.  Echo showed EF 60 to 65% grade 2 diastolic dysfunction, no significant valvular disease.  Coronary CTA showed coronary calcium score of 884 with moderate nonobstructive CAD.  FFR was negative.  Admission complicated by A-fib RVR.  Patient was admitted in February 2024 for aspiration pneumonia.  Hospitalization was complicated by alcohol  withdrawal delirium and orthostatic hypotension requiring midodrine.  Echo during that admission showed EF of 60 to 65%, no wall motion abnormalities, normal RV function and no significant valvular disease.  He was discharged to CIR.  Later that month he presented back to the ED for diarrhea and weakness he was positive for C. difficile and started on antibiotics.  At home he was noted to be back in rapid A-fib with rates in the 150s.  There was a concern for acute on chronic diastolic heart failure.  BNP elevated to 484.  High-sensitivity troponin was negative.  He was started on IV Lasix.  Cardiology was asked to see for A-fib management.  He was on Cardizem 180 mg daily.  Patient converted on diltiazem.  Patient presented to the ER 12/02/2022 with shortness of breath and psych eval.  Patient was having hallucinations.  On arrival he was found to be in A-fib RVR with rates in 130s and 40s.  Chest x-ray was negative.  Troponin was negative.  BNP 341.  AKI with creatinine 1.38 LFTs were abnormal.  He was given IV fluids and IV Dilt.  Patient was admitted for alcohol abuse with alcohol-induced mood disorder, hallucinations, paroxysmal A-fib with RVR, abnormal LFTs.  Patient was also found to have incidental acute small stroke.  Patient was noted to be hypotensive and Cardizem was stopped.  Cardiology was asked to see.   Past Medical History:  Diagnosis Date   Actinic keratosis 11/27/2016   Alcohol abuse with alcohol-induced mood disorder (HCC) 07/18/2018   Alcohol use disorder, severe, dependence (HCC)  09/16/2006   05/22/2015 Argumentative, belligerent and verbally abusive to staff per his note from [place]    Alcoholism [facility])    Anxiety state 04/25/2018   Aortic atherosclerosis (HCC) 08/27/2022   Chronic low back pain 09/16/2006   Followed by NS.  Per his chart from [place], he fell off two storyhouse roof while cleaning his brother's gutters and sustained compression fracture of L3, L4 and  L5 and fracture of right transverse process at L3 and nondisplaced fracture of left glenoid rim many years ago. He had multiple imaging in [place] including Lumbar MRI in 2016 which showed moderate to severe spinal canal stenos   Delirium tremens (HCC) 09/01/2017   Depression    Dry eye 11/23/2016   Foreign body in middle portion of esophagus 05/19/2022   Hiatal hernia 09/14/2016   Status Collis gastroplasty and Nissen's fundoplication on 04/29/2015 in [place]   History of delirium tremons 07/22/2020   DTs during 10/2016 08/2017. And 12/2017 admissions    History of pulmonary embolus (PE) 11/02/2016   Unprovoked 11/01/2016. Xarelto from 11/01/16 to 09/08/2017.   Hydrocele    Surgically corrected   Hypertension    Hypoalbuminemia due to protein-calorie malnutrition (HCC) 07/22/2020   Lumbar compression fracture (HCC)    Multiple rib fractures 07/22/2019   Left rib details XR: left ribs demonstrate multiple remote rib   Pancytopenia (HCC)    Rhabdomyolysis 07/22/2020   Skin cancer    exciced 2017   Tinea versicolor 06/08/2013   Tobacco use disorder 07/22/2020   Tooth infection 04/25/2018   Trigger finger, acquired 11/18/2012   Ulnar tunnel syndrome of right wrist 11/08/2012    Past Surgical History:  Procedure Laterality Date   BIOPSY  05/21/2022   Procedure: BIOPSY;  Surgeon: Lynann Bologna, MD;  Location: [facility] ENDOSCOPY;  Service: Gastroenterology;;   CATARACT EXTRACTION     ESOPHAGOGASTRODUODENOSCOPY (EGD) WITH PROPOFOL N/A 05/21/2022   Procedure: ESOPHAGOGASTRODUODENOSCOPY (EGD) WITH PROPOFOL;  Surgeon: Lynann Bologna, MD;  Location: [facility] ENDOSCOPY;  Service: Gastroenterology;  Laterality: N/A;   FOREIGN BODY REMOVAL N/A 05/21/2022   Procedure: FOREIGN BODY REMOVAL;  Surgeon: Lynann Bologna, MD;  Location: [facility] ENDOSCOPY;  Service: Gastroenterology;  Laterality: N/A;   HIATAL HERNIA REPAIR  2016   HYDROCELE EXCISION / REPAIR  2010   SKIN CANCER EXCISION Left    Excised 2017      Home Medications:  Prior to Admission medications   Medication Sig Start Date End Date Taking? Authorizing Provider  acetaminophen (TYLENOL) 500 MG tablet Take 1 tablet (500 mg total) by mouth every 8 (eight) hours as needed. Patient taking differently: Take 1,000 mg by mouth every 6 (six) hours as needed (for pain). 10/13/22  Yes Gherghe, Daylene Katayama, MD  BAYER BACK & BODY PAIN EX ST 500-32.5 MG TABS Take 1-2 tablets by mouth every 6 (six) hours as needed (for pain).   Yes [provider]  budesonide-formoterol (SYMBICORT) 160-4.5 MCG/ACT inhaler Inhale 2 puffs into the lungs 2 (two) times daily. 10/13/22  Yes Gherghe, Daylene Katayama, MD  methocarbamol (ROBAXIN) 500 MG tablet Take 1 tablet (500 mg total) by mouth every 6 (six) hours as needed for muscle spasms. 10/09/22  Yes Barnetta Chapel, PA-C  Multiple Vitamins-Minerals (CENTRUM SILVER 50+MEN) TABS Take 1 tablet by mouth every other day.   Yes [provider]  Nutritional Supplements (ENSURE HIGH PROTEIN) LIQD Take 237 mLs by mouth daily.   Yes [provider]  TYLENOL PM EXTRA STRENGTH 500-25 MG TABS tablet Take 2  tablets by mouth at bedtime as needed (for sleep).   Yes [provider]  diltiazem (CARDIZEM SR) 60 MG 12 hr capsule Take 1 capsule (60 mg total) by mouth every 12 (twelve) hours. Patient not taking: Reported on 12/02/2022 10/13/22   Leatha Gilding, MD  ferrous sulfate 325 (65 FE) MG tablet Take 1 tablet (325 mg total) by mouth daily with breakfast. Patient not taking: Reported on 12/02/2022 10/13/22 12/02/22  Leatha Gilding, MD  folic acid (FOLVITE) 1 MG tablet Take 1 tablet (1 mg total) by mouth daily. Patient not taking: Reported on 12/02/2022 10/13/22   Leatha Gilding, MD  furosemide (LASIX) 20 MG tablet Take 1 tablet (20 mg total) by mouth daily. Patient not taking: Reported on 12/02/2022 10/13/22   Leatha Gilding, MD  Magnesium Oxide 400 MG CAPS Take 1 capsule (400 mg total) by mouth 2 (two)  times daily. Patient not taking: Reported on 12/02/2022 10/13/22   Leatha Gilding, MD  metoprolol tartrate (LOPRESSOR) 25 MG tablet Take 1/2 tablet (12.5 mg total) by mouth 2 (two) times daily. Patient not taking: Reported on 12/02/2022 10/13/22 12/02/22  Leatha Gilding, MD  rivaroxaban (XARELTO) 20 MG TABS tablet Take 1 tablet (20 mg) by mouth daily with supper. Patient not taking: Reported on 12/02/2022 10/13/22 12/02/22  Leatha Gilding, MD  rosuvastatin (CRESTOR) 10 MG tablet Take 1 tablet (10 mg total) by mouth daily. Patient not taking: Reported on 12/02/2022 10/13/22   Leatha Gilding, MD  tamsulosin (FLOMAX) 0.4 MG CAPS capsule Take 1 capsule (0.4 mg total) by mouth daily after supper. Patient not taking: Reported on 12/02/2022 10/13/22   Leatha Gilding, MD    Inpatient Medications: Scheduled Meds:   stroke: early stages of recovery book  1 each Does not apply Once   feeding supplement  237 mL Oral BID BM   ferrous sulfate  325 mg Oral Q breakfast   folic acid  1 mg Oral Daily   magnesium oxide  400 mg Oral BID   metoprolol tartrate  12.5 mg Oral BID   mometasone-formoterol  2 puff Inhalation BID   multivitamin with minerals  1 tablet Oral Daily   rivaroxaban  20 mg Oral Q supper   rosuvastatin  10 mg Oral Daily   tamsulosin  0.4 mg Oral QPC supper   thiamine  100 mg Oral Daily   Or   thiamine  100 mg Intravenous Daily   Continuous Infusions:  PRN Meds: acetaminophen **OR** acetaminophen, LORazepam **OR** LORazepam, ondansetron **OR** ondansetron (ZOFRAN) IV  Allergies:    Allergies  Allergen Reactions   Other Itching and Other (See Comments)    Seasonal allergies- Itchy eyes, runny nose, congestion   Poison Ivy Extract Rash    Social History:   Social History   Socioeconomic History   Marital status: Single    Spouse name: Not on file   Number of children: Not on file   Years of education: Not on file   Highest education level: Not on file  Occupational  History   Occupation: unemployed  Tobacco Use   Smoking status: Never   Smokeless tobacco: Current    Types: Snuff   Tobacco comments:    Pt states do snuff when stressed had 1 can last 3 month//PL  Vaping Use   Vaping Use: Never used  Substance and Sexual Activity   Alcohol use: Yes    Alcohol/week: 43.0 standard drinks of alcohol  Types: 42 Cans of beer, 1 Standard drinks or equivalent per week   Drug use: Yes    Types: Marijuana   Sexual activity: Yes    Birth control/protection: None  Other Topics Concern   Not on file  Social History Narrative   Lives with a friend. Has children but hasn't met them in a while. He doesn't know where they live.    Social Determinants of Health   Financial Resource Strain: Not on file  Food Insecurity: Food Insecurity Present (12/02/2022)   Hunger Vital Sign    Worried About Running Out of Food in the Last Year: Often true    Ran Out of Food in the Last Year: Often true  Transportation Needs: Unmet Transportation Needs (12/02/2022)   PRAPARE - Administrator, Civil Service (Medical): Yes    Lack of Transportation (Non-Medical): Yes  Physical Activity: Not on file  Stress: Not on file  Social Connections: Not on file  Intimate Partner Violence: Not At Risk (12/02/2022)   Humiliation, Afraid, Rape, and Kick questionnaire    Fear of Current or Ex-Partner: No    Emotionally Abused: No    Physically Abused: No    Sexually Abused: No    Family History:    Family History  Problem Relation Age of Onset   Heart attack Father        died at 102 years   Dementia Father    Stroke Father    Cancer Sister    Colon cancer Neg Hx    Colon polyps Neg Hx    Esophageal cancer Neg Hx    Stomach cancer Neg Hx    Rectal cancer Neg Hx      ROS:  Please see the history of present illness.   All other ROS reviewed and negative.     Physical Exam/Data:   Vitals:   12/04/22 0246 12/04/22 0619 12/04/22 0740 12/04/22 0745  BP:  112/86 112/82  (!) 125/93  Pulse: 88 84  66  Resp: 20 18 20    Temp: 98.6 F (37 C) 97.8 F (36.6 C)    TempSrc: Oral Oral    SpO2: 100% 100% 99%   Weight:      Height:        Intake/Output Summary (Last 24 hours) at 12/04/2022 0940 Last data filed at 12/04/2022 0300 Gross per 24 hour  Intake 240 ml  Output 320 ml  Net -80 ml      12/02/2022    8:53 AM 10/10/2022    9:00 AM 10/10/2022    2:00 AM  Last 3 Weights  Weight (lbs) 133 lb 2.5 oz 147 lb 0.8 oz 149 lb 14.6 oz  Weight (kg) 60.4 kg 66.7 kg 68 kg     Body mass index is 19.66 kg/m.  General:  Well nourished, well developed, in no acute distress HEENT: normal Neck: no JVD Vascular: No carotid bruits; Distal pulses 2+ bilaterally Cardiac:  normal S1, S2; Irreg Irreg, tachy; no murmur  Lungs:  clear to auscultation bilaterally, no wheezing, rhonchi or rales  Abd: soft, nontender, no hepatomegaly  Ext: no edema Musculoskeletal:  No deformities, BUE and BLE strength normal and equal Skin: warm and dry  Neuro:  CNs 2-12 intact, no focal abnormalities noted Psych:  Normal affect   EKG:  The EKG was personally reviewed and demonstrates:  Afib, 146, borderline ST depression inf/lat leads Telemetry:  Telemetry was personally reviewed and demonstrates:  Afib HR 90-120bpm  Relevant  CV Studies:  Echo 08/2022    1. Left ventricular ejection fraction, by estimation, is 60 to 65%. The  left ventricle has normal function. The left ventricle has no regional  wall motion abnormalities. Left ventricular diastolic parameters are  indeterminate.   2. Right ventricular systolic function is normal. The right ventricular  size is mildly enlarged. Tricuspid regurgitation signal is inadequate for  assessing PA pressure.   3. The mitral valve is normal in structure. No evidence of mitral valve  regurgitation. No evidence of mitral stenosis.   4. The aortic valve was not well visualized. Aortic valve regurgitation  is not visualized. No  aortic stenosis is present.   Laboratory Data:  High Sensitivity Troponin:   Recent Labs  Lab 12/02/22 0409 12/02/22 0858  TROPONINIHS 11 10     Chemistry Recent Labs  Lab 12/02/22 0409 12/03/22 0733 12/04/22 0406  NA 131* 133* 135  K 4.4 3.3* 4.9  CL 99 104 105  CO2 13* 22 23  GLUCOSE 95 90 99  BUN 28* 20 21  CREATININE 1.38* 0.69 0.87  CALCIUM 9.0 8.3* 8.1*  MG 1.6*  --  1.4*  GFRNONAA 56* >60 >60  ANIONGAP 19* 7 7    Recent Labs  Lab 12/02/22 0409 12/03/22 0733  PROT 8.4* 6.8  ALBUMIN 4.1 3.4*  AST 54* 29  ALT 21 17  ALKPHOS 77 63  BILITOT 1.5* 1.0   Lipids  Recent Labs  Lab 12/04/22 0406  CHOL 121  TRIG 63  HDL 60  LDLCALC 48  CHOLHDL 2.0    Hematology Recent Labs  Lab 12/02/22 0409 12/03/22 0420 12/04/22 0406  WBC 6.1 3.5* 4.7  RBC 3.64* 2.91* 2.74*  HGB 11.8* 9.7* 9.1*  HCT 36.6* 29.0* 28.6*  MCV 100.5* 99.7 104.4*  MCH 32.4 33.3 33.2  MCHC 32.2 33.4 31.8  RDW 19.6* 20.2* 20.8*  PLT 252 175 137*   Thyroid  Recent Labs  Lab 12/02/22 1027  TSH 3.074    BNP Recent Labs  Lab 12/02/22 0409  BNP 341.6*    DDimer No results for input(s): "DDIMER" in the last 168 hours.   Radiology/Studies:  CT ANGIO HEAD NECK W WO CM  Result Date: 12/03/2022 CLINICAL DATA:  Stroke/TIA, determine embolic source. EXAM: CT ANGIOGRAPHY HEAD AND NECK WITH AND WITHOUT CONTRAST TECHNIQUE: Multidetector CT imaging of the head and neck was performed using the standard protocol during bolus administration of intravenous contrast. Multiplanar CT image reconstructions and MIPs were obtained to evaluate the vascular anatomy. Carotid stenosis measurements (when applicable) are obtained utilizing NASCET criteria, using the distal internal carotid diameter as the denominator. RADIATION DOSE REDUCTION: This exam was performed according to the departmental dose-optimization program which includes automated exposure control, adjustment of the mA and/or kV according to  patient size and/or use of iterative reconstruction technique. CONTRAST:  75mL OMNIPAQUE IOHEXOL 350 MG/ML SOLN COMPARISON:  Brain MRI from earlier today FINDINGS: CT HEAD FINDINGS Brain: No evidence of visible acute infarction, hemorrhage, hydrocephalus, extra-axial collection or mass lesion/mass effect. Brain atrophy especially affecting the cerebellum. Vascular: See below Skull: Normal. Negative for fracture or focal lesion. Sinuses/Orbits: No acute finding. Review of the MIP images confirms the above findings CTA NECK FINDINGS Aortic arch: 3 vessel branching Right carotid system: Atheromatous calcification at the carotid bifurcation without stenosis or ulceration. Left carotid system: Atheromatous plaque at the bifurcation without stenosis or ulceration. Vertebral arteries: No proximal subclavian stenosis. The right vertebral artery is dominant. Vertebral arteries are  smoothly contoured and widely patent Skeleton: No acute finding. Generalized degeneration of the cervical spine with C3-4 anterolisthesis. Other neck: Negative Upper chest: Calcified granuloma in the right upper lobe. Review of the MIP images confirms the above findings CTA HEAD FINDINGS Anterior circulation: No branch occlusion, beading, or flow limiting stenosis. Motion artifact above the level of the circle-of-Willis. Left MCA bifurcation aneurysm projecting anteriorly and measuring 2 mm. No evidence of vascular malformation. Posterior circulation: The vertebral and basilar arteries are smoothly contoured and widely patent. No branch occlusion, beading, aneurysm, or vascular malformation. Venous sinuses: Diffusely patent Anatomic variants: None significant Review of the MIP images confirms the above findings IMPRESSION: 1. No emergent arterial finding. 2. Atherosclerosis without flow limiting stenosis of major arteries in the head and neck. 3. 2 mm left MCA bifurcation aneurysm. Electronically Signed   By: Tiburcio Pea M.D.   On: 12/03/2022  15:39   MR BRAIN WO CONTRAST  Result Date: 12/03/2022 CLINICAL DATA:  67 year old male altered mental status. EXAM: MRI HEAD WITHOUT CONTRAST TECHNIQUE: Multiplanar, multiecho pulse sequences of the brain and surrounding structures were obtained without intravenous contrast. COMPARISON:  Brain MRI 08/16/2020.  Head CT 10/06/2022. FINDINGS: Brain: Tiny cortical or subcortical focus of diffusion restriction at the left superior frontal gyrus series 5, image 41, correlated on coronal series 13, image 12 DWI. No T2 or FLAIR hyperintensity. No other diffusion restriction. No midline shift, mass effect, evidence of mass lesion, ventriculomegaly, extra-axial collection or acute intracranial hemorrhage. Cervicomedullary junction and pituitary are within normal limits. Cerebral volume is stable since 2022. Outside of the acute finding stable minimal to mild nonspecific white matter T2 and FLAIR hyperintensity in both hemispheres. No cortical encephalomalacia. No chronic cerebral blood products on SWI. However, there is questionable chronic atrophy of the cerebellar vermis. Vascular: Major intracranial vascular flow voids are preserved, stable since 2022. Skull and upper cervical spine: Cervical spine degeneration including C3-C4 spondylolisthesis and mild spinal stenosis on series 7, image 12. Visualized bone marrow signal is within normal limits. Sinuses/Orbits: Stable, negative. Other: Mastoids are clear. Visible internal auditory structures appear normal. Negative visible scalp and face. IMPRESSION: 1. Positive for a punctate Acute cortical/subcortical Infarct in the Left superior frontal gyrus (motor/pre motor area). No associated hemorrhage or mass effect. Query right side weakness. 2. Otherwise stable since 2022 and largely negative for age noncontrast MRI appearance of the brain; questionable chronic atrophy of the cerebellar vermis. 3. Cervical spine degeneration with C3-C4 spondylolisthesis and mild spinal  stenosis. Electronically Signed   By: Odessa Fleming M.D.   On: 12/03/2022 06:35   EEG adult  Result Date: 12/02/2022 Charlsie Quest, MD     12/02/2022  5:58 PM Patient Name: LELON REHRER MRN: [id number] Epilepsy Attending: Charlsie Quest Referring Physician/Provider: Bobette Mo, MD Date: 12/02/2022 Duration: 24.30 mins Patient history: 67yo m with ams getting eeg to evaluate for seizure Level of alertness: Awake AEDs during EEG study: None Technical aspects: This EEG study was done with scalp electrodes positioned according to the 10-20 International system of electrode placement. Electrical activity was reviewed with band pass filter of 1-70Hz , sensitivity of 7 uV/mm, display speed of 73mm/sec with a 60Hz  notched filter applied as appropriate. EEG data were recorded continuously and digitally stored.  Video monitoring was available and reviewed as appropriate. Description: EEG showed continuous generalized 3 to 6 Hz theta-delta slowing. Hyperventilation and photic stimulation were not performed.   Of note, study was technically difficult due to significant electrode  artifact secondary to matted hair  ABNORMALITY - Continuous slow, generalized IMPRESSION: This technically difficult study is  suggestive of moderate diffuse encephalopathy, nonspecific etiology. No seizures or epileptiform discharges were seen throughout the recording. Charlsie Quest   DG Skull 1-3 Views  Result Date: 12/02/2022 CLINICAL DATA:  MRI clearance EXAM: SKULL - 1-3 VIEW COMPARISON:  CT head 10/06/2022 FINDINGS: There is no evidence of skull fracture or other focal bone lesions. No metallic foreign bodies are demonstrated. Paranasal sinuses are clear. IMPRESSION: No metallic foreign bodies are demonstrated. Electronically Signed   By: Burman Nieves M.D.   On: 12/02/2022 17:25   DG Abd 1 View  Result Date: 12/02/2022 CLINICAL DATA:  Metal screening prior MRI. EXAM: ABDOMEN - 1 VIEW COMPARISON:  None Available. FINDINGS:  There surgical clips and staple line seen in the left upper quadrant. There is likely pen external artifact overlying the right hip. There are vascular calcifications in the pelvis. Bowel-gas pattern is nonobstructive. No fractures are seen. IMPRESSION: 1.  Surgical clips and staple line in the left upper quadrant. 2. Pen artifact overlying the right hip. Please correlate clinically. Electronically Signed   By: Darliss Cheney M.D.   On: 12/02/2022 17:25   DG Chest 2 View  Result Date: 12/02/2022 CLINICAL DATA:  67 year old male with shortness of breath. Atrial fibrillation. EXAM: CHEST - 2 VIEW COMPARISON:  Portable chest 10/10/2022 and earlier. FINDINGS: Semi upright AP and lateral views of the chest at 0440 hours. Lung volumes and mediastinal contours are stable and within normal limits. No cardiomegaly. Visualized tracheal air column is within normal limits. No pneumothorax, pulmonary edema, pleural effusion or confluent lung opacity. Chronic distal right clavicle deformity. No acute osseous abnormality identified. Epigastric surgical clips are chronic. Negative visible bowel gas. IMPRESSION: No acute cardiopulmonary abnormality. Electronically Signed   By: Odessa Fleming M.D.   On: 12/02/2022 05:37     Assessment and Plan:   Paroxysmal A-fib with RVR -Patient was admitted with hallucinations, AMS, alcohol abuse with alcohol mood disorder noted to have A-fib RVR started on IV dilt -Patient has multiple admissions with A-fib RVR.  He has never followed up with cardiology in the outpatient setting -Outside the hospital, patient reports he does not take his cardiac medications.  Social situation is complicated by homelessness, psych disorders, poor understanding of medical conditions -PTA Xarelto, diltiazem 60 mg every 12 hours, Xarelto 20 mg daily, Lopressor 12.5 mg twice daily - on admission restarted on Xarelto  - Iv dilt was transitioned to oral, but this was stopped due to ?hypotension - Mag 1.4,  goal>2 - goal K>4 - monitor Hgb and plt with Xarelto - Patient remains in A-fib with rates currently controlled but spikes to 130s - BP has been occasionally soft - he is on metoprolol 12.5mg  BID>can increase to 25 mg BID  Incidental small stroke - acute punctate cortical/subcortical infarct in the left superior frontal gyrus - neurology saw and OK to continue with Xarelto - per IM/Neuro  Alcohol abuse - CIWA per IM - folic acid and thiamine  Hallucination AMS - possibly from alcohol withdrawal - stroke would not explain symptoms - evaluated by psych  For questions or updates, please contact [facility] Please consult [email or web address] for contact info under    Signed, Cadence David Stall, PA-C  12/04/2022 9:40 AM   Patient seen and examined.  Agree with above documentation.  Mr. Lafon is a 67 year old male with a history of chronic diastolic heart failure, paroxysmal  atrial fibrillation, PE, hypertension, alcohol abuse, noncompliance who we are consulted by Dr. Sunnie Nielsen for evaluation of atrial fibrillation with RVR.  Patient is homeless, reports has not been taking his medications.  He was initially diagnosed with A-fib during admission 07/2020 with alcohol withdrawal.  Echocardiogram showed normal LV systolic function.  He was admitted 09/2020 with sepsis.  Coronary CTA was done which showed nonobstructive CAD, calcium score 884.  He was admitted 08/2022 with aspiration pneumonia, required midodrine for orthostatic hypotension during admission.  Echo showed normal biventricular function no significant valvular disease.  He was admitted 12/02/2022 with hallucinations and shortness of breath.  He was found to be in A-fib with RVR, rates up to 140s.  He was continued on Xarelto.  Started on diltiazem drip and transitioned to oral diltiazem but was discontinued due to low BP.  Also noted to have incidental acute small stroke.  Neurology okay with continuing Xarelto.  Most recent vitals  show BP 117/86, SpO2 100% on room air.  Labs notable for creatinine 0.87 (improved from 1.38 on admission).  Telemetry shows Afib with rates up to 130s, but currently in 70-90s.  On exam, patient is somnolent but arousable, oriented to person only, regular rhythm, normal rate, no murmurs, lungs CTAB, no LE edema.  For his atrial fibrillation, would continue Xarelto for anticoagulation.  Increase metoprolol to 25 mg twice daily for rate control.  Dayhoff Ishikawa, MD

## 2022-12-04 NOTE — Evaluation (Signed)
Speech Language Pathology Evaluation Patient Details Name: Anthony [name] MRN: [id number] DOB: 1956-06-08 Today's Date: 12/04/2022 Time: 7829-5621 SLP Time Calculation (min) (ACUTE ONLY): 20 min  Problem List:  Patient Active Problem List   Diagnosis Date Noted   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 difficile colitis 09/11/2022   Aspiration pneumonia (HCC) 08/27/2022   Aortic atherosclerosis (HCC) 08/27/2022   Depression, unspecified 08/09/2022   Hyponatremia 06/01/2022   Non compliance w medication regimen 05/31/2022   Delirium 05/25/2022   Stricture and stenosis of esophagus 05/21/2022   Gastritis and gastroduodenitis 05/21/2022   Loose stools 05/20/2022   Malnutrition of moderate degree 05/20/2022   Acute on chronic congestive heart failure (HCC)    Leukocytosis 05/19/2022   Sepsis (HCC) 05/19/2022   Cellulitis of left leg, possible 05/19/2022    Class: Question of   Moniliasis, interdigital 05/19/2022   Open toe wound, right foot, medial fourth digit 05/19/2022   Malnutrition (HCC) 05/19/2022   Alcohol abuse 03/24/2022   Dyslipidemia 03/24/2022   Acute on chronic diastolic CHF (congestive heart failure) (HCC) 03/24/2022   Acute respiratory failure with hypoxia (HCC) 03/23/2022   Generalized weakness    Atrial fibrillation with RVR (HCC) 03/15/2022   Hypokalemia 03/15/2022   Decreased functional mobility and endurance 06/22/2021   Iron deficiency anemia 10/16/2020   Cough    Hypomagnesemia    Protein-calorie malnutrition, severe 08/22/2020   Alcohol use disorder, severe, in controlled environment (HCC)    Pressure injury of skin 08/18/2020   Urinary tract infection without  hematuria    Severe sepsis (HCC) 08/14/2020   Paroxysmal atrial fibrillation (HCC) 08/13/2020   Rhabdomyolysis 07/22/2020   Cerebral ventriculomegaly 07/22/2020   Hemoglobin decreased 07/22/2020   History of delirium tremons 07/22/2020   Hypoalbuminemia due to protein-calorie malnutrition (HCC) 07/22/2020   Tobacco use disorder 07/22/2020   Alcohol withdrawal delirium (HCC)    Dehydration    Alcohol abuse with alcohol-induced mood disorder (HCC) 07/18/2018   Depression 05/26/2018   Anxiety state 04/25/2018   Need for immunization against influenza 04/25/2018   Alcohol withdrawal (HCC) 09/01/2017   DOE (dyspnea on exertion) 11/27/2016   Actinic keratosis 11/27/2016   Macrocytic anemia 11/23/2016   History of pulmonary embolus (PE) 11/02/2016   Hiatal hernia 09/14/2016   Skin cancer 08/13/2016   Poor social situation 06/09/2013   Tinea versicolor 06/08/2013   Back pain 05/07/2013   Seborrheic dermatitis of scalp 11/08/2012   HTN (hypertension) 04/11/2012   Alcohol use disorder, severe, dependence (HCC) 09/16/2006   Past Medical History:  Past Medical History:  Diagnosis Date   Actinic keratosis 11/27/2016   Alcohol abuse with alcohol-induced mood disorder (HCC) 07/18/2018   Alcohol use disorder, severe, dependence (HCC) 09/16/2006   05/22/2015 Argumentative, belligerent and verbally abusive to staff per his note from [place]    Alcoholism [facility])    Anxiety state 04/25/2018   Aortic atherosclerosis (HCC) 08/27/2022   Chronic low back pain 09/16/2006   Followed by [facility].  Per his chart from [place], he fell off two storyhouse roof while cleaning his brother's gutters and sustained compression fracture of L3, L4 and L5 and fracture of right transverse process at L3 and nondisplaced fracture of left glenoid rim many years ago. He  had multiple imaging in [place] including Lumbar MRI in 2016 which showed moderate to severe spinal canal stenos   Delirium tremens (HCC)  09/01/2017   Depression    Dry eye 11/23/2016   Foreign body in middle portion of esophagus 05/19/2022   Hiatal hernia 09/14/2016   Status Collis gastroplasty and Nissen's fundoplication on 04/29/2015 in    History of delirium tremons 07/22/2020   DTs during 10/2016 08/2017. And 12/2017 admissions    History of pulmonary embolus (PE) 11/02/2016   Unprovoked 11/01/2016. Xarelto from 11/01/16 to 09/08/2017.   Hydrocele    Surgically corrected   Hypertension    Hypoalbuminemia due to protein-calorie malnutrition (HCC) 07/22/2020   Lumbar compression fracture (HCC)    Multiple rib fractures 07/22/2019   Left rib details XR: left ribs demonstrate multiple remote rib   Pancytopenia (HCC)    Rhabdomyolysis 07/22/2020   Skin cancer    exciced 2017   Tinea versicolor 06/08/2013   Tobacco use disorder 07/22/2020   Tooth infection 04/25/2018   Trigger finger, acquired 11/18/2012   Ulnar tunnel syndrome of right wrist 11/08/2012   Past Surgical History:  Past Surgical History:  Procedure Laterality Date   BIOPSY  05/21/2022   Procedure: BIOPSY;  Surgeon: Lynann Bologna, MD;  Location: [facility] ENDOSCOPY;  Service: Gastroenterology;;   CATARACT EXTRACTION     ESOPHAGOGASTRODUODENOSCOPY (EGD) WITH PROPOFOL N/A 05/21/2022   Procedure: ESOPHAGOGASTRODUODENOSCOPY (EGD) WITH PROPOFOL;  Surgeon: Lynann Bologna, MD;  Location: [facility] ENDOSCOPY;  Service: Gastroenterology;  Laterality: N/A;   FOREIGN BODY REMOVAL N/A 05/21/2022   Procedure: FOREIGN BODY REMOVAL;  Surgeon: Lynann Bologna, MD;  Location: [facility] ENDOSCOPY;  Service: Gastroenterology;  Laterality: N/A;   HIATAL HERNIA REPAIR  2016   HYDROCELE EXCISION / REPAIR  2010   SKIN CANCER EXCISION Left    Excised [id number]   HPI:  67 year old with past medical history significant for actinic keratoses, alcohol abuse, alcohol withdrawal, anxiety, depression, hiatal hernia, pulmonary embolism, surgically corrected hydrocephalus, hypertension, hypokalemia,  lumbar compression fracture, pancytopenia, rhabdomyolysis, tobacco use disorder, who presented to the ED brought by [facility] due to altered mental status.  Patient has not been taking his medications. MRI showed a small CVA, Acute punctate cortical/subcortical infarct in the left superior frontal gyrus. Positive for hallunications which per MD could be in the setting of alcohol vs acute metabolic encephalopathy.   Assessment / Plan / Recommendation Clinical Impression  Cognitive linguistic evaluation complete. Patient presents cognitive deficits in the areas of sustained attention, awareness, short term memory and reasoning. Speech and language function is intact although patient is very verbose and tangential due to cognitive deficits. Unclear what patient's baseline cognitive status is at this time. Given acute CVA, will initiate SLP for cognition and attempt to contact family for additional information on patient's baseline status.    SLP Assessment  SLP Recommendation/Assessment: Patient needs continued Speech Lanaguage Pathology Services SLP Visit Diagnosis: Cognitive communication deficit (R41.841)    Recommendations for follow up therapy are one component of a multi-disciplinary discharge planning process, led by the attending physician.  Recommendations may be updated based on patient status, additional functional criteria and insurance authorization.    Follow Up Recommendations   (TBD)          Frequency and Duration min 2x/week  2 weeks      SLP Evaluation Cognition  Overall Cognitive Status: No family/caregiver present to determine baseline cognitive functioning Arousal/Alertness: Awake/alert Orientation Level: Oriented to person;Oriented to place;Disoriented to situation;Oriented to time Attention:  Sustained Sustained Attention: Impaired Sustained Attention Impairment: Verbal complex;Functional complex Memory: Impaired Memory Impairment: Storage deficit;Retrieval  deficit Awareness: Impaired Awareness Impairment: Intellectual impairment Problem Solving: Appears intact Executive Function: Reasoning;Decision Making Reasoning: Impaired Reasoning Impairment: Verbal complex;Functional complex Decision Making: Impaired Decision Making Impairment: Verbal complex;Functional complex Behaviors: Impulsive Safety/Judgment: Impaired       Comprehension  Auditory Comprehension Overall Auditory Comprehension: Appears within functional limits for tasks assessed Visual Recognition/Discrimination Discrimination: Within Function Limits    Expression Expression Primary Mode of Expression: Verbal Verbal Expression Overall Verbal Expression: Appears within functional limits for tasks assessed Written Expression Dominant Hand: Right Written Expression: Not tested   Oral / Motor  Oral Motor/Sensory Function Overall Oral Motor/Sensory Function: Within functional limits Motor Speech Overall Motor Speech: Appears within functional limits for tasks assessed           Ferdinand Lango MA, CCC-SLP  Anthony [name] 12/04/2022, 11:53 AM

## 2022-12-04 NOTE — Progress Notes (Signed)
PT Cancellation Note  Patient Details Name: Anthony [name] MRN: [id number] DOB: 02/17/1956   Cancelled Treatment:    Reason Eval/Treat Not Completed: Patient declined, no reason specified Pt reports ambulating short distance in hallway with OT earlier and became dizzy.  Pt reports dizziness off and on at baseline.  Pt declines OOB activity again at this time.   Kati L Payson 12/04/2022, 11:12 AM Paulino Door, DPT Physical Therapist Acute Rehabilitation Services Office: [phone]

## 2022-12-04 NOTE — Progress Notes (Signed)
PROGRESS NOTE    Anthony [name]  WUJ:[id number] DOB: 1956-06-18 DOA: 12/02/2022 PCP: Pcp, No   Brief Narrative: 67 year old with past medical history significant for actinic keratoses, alcohol abuse, alcohol withdrawal, anxiety, depression, hiatal hernia, pulmonary embolism, surgically corrected hydrocephalus, hypertension, hypokalemia, lumbar compression fracture, pancytopenia, rhabdomyolysis, tobacco use disorder, who presented to the ED brought by [facility] due to altered mental status.  Patient has not been taking his medications.  He was stating that there was a religious group outside of his window and people trying to break into his home.  He drinks 6-12 beers daily but have not been drinking for the last 2 days.  Evaluation in the ED he was found to have A-fib with RVR.  He was a started on Cardizem drip and subsequently transitioned to oral Cardizem. MRI was positive for a small stroke.  Neurology has been consulted. Patient was also evaluated by psych who recommended medical evaluation for hallucination but likely could be related to alcoholic hallucinations.  No inpatient facility required.   Assessment & Plan:   Principal Problem:   Atrial fibrillation with RVR (HCC) Active Problems:   HTN (hypertension)   Paroxysmal atrial fibrillation (HCC)   Hypomagnesemia   Dyslipidemia   Macrocytic anemia   Alcohol abuse with alcohol-induced mood disorder (HCC)   Tobacco use disorder   Hyponatremia   Aortic atherosclerosis (HCC)   Abnormal LFTs   Stroke (cerebrum) (HCC)  1-Paroxysmal A-fib with RVR: -Continue with Xarelto -Initially on Cardizem drip and subsequently transition to oral Cardizem twice daily--Discontinue due to Hypotension.  -Continue with metoprolol , hopefully BP will tolerates it.  -Cardiology consulted, Hypotension limiting management of A fib. He has prior HO of orthostatic hypotension and was on midodrine in the past.   2-Hallucination: Could be in the setting of  alcohol used. Acute metabolic encephalopathy -Evaluated by psych does not require inpatient hospitalization. -Recommended ammonia, EEG, MRI: Ammonia negative EEG evidence of encephalopathy.  MRI with a small acute stroke per neurology would not explain symptoms. -suspect related to alcohol use.   3-Alcohol abuse with alcohol-induced mood disorder: Patient presented with probably alcoholic hallucination, chronic alcoholic encephalopathy Continue with CIWA Continue with folic acid and thiamine  4-Abnormal liver function test in the setting of alcohol use: Follow trend Normalized.   5-incidental acute small stroke: Acute punctate cortical/subcortical infarct in the left superior frontal gyrus -Okay to continue with Xarelto, discussed with neurology -Neurology has been consulted plan to proceed with CT angio head and neck A1c: 4.9, LDL 48 On xarelto. No need for ECHp, had recent echo.  CTA head and b=neck no large vessel occlusion.   Hypertension: Continue with metoprolol  Hyponatremia: in setting alcohol use. Improving.   Hypomagnesemia: Replete IV.   Dyslipidemia Aortic atherosclerosis (HCC) Continue rosuvastatin    Macrocytic anemia Monitor hematocrit and hemoglobin. Nutritional supplementation.   Tobacco use disorder Uses smokeless tobacco. Cessation advised.    Hypotension; BP drop to 70 yesterday, improved with IV fluids. Cardizem discontinue. He has had previous history of hypotension requiring midodrine,. Might have to consider start midodrine.   Estimated body mass index is 19.66 kg/m as calculated from the following:   Height as of this encounter: 5\' 9"  (1.753 m).   Weight as of this encounter: 60.4 kg.   DVT prophylaxis: Xarelto Code Status: Full code Family Communication: Care discussed with patient Disposition Plan:  Status is: Observation The patient remains OBS appropriate and will d/c before 2 midnights.    Consultants:  Psych  Neurology    Procedures:    Antimicrobials:    Subjective: He is alert, conversant. Doesn't know if visual hallucination are better.  Report living dreams.   Objective: Vitals:   12/04/22 0246 12/04/22 0619 12/04/22 0740 12/04/22 0745  BP: 112/86 112/82  (!) 125/93  Pulse: 88 84  66  Resp: 20 18 20    Temp: 98.6 F (37 C) 97.8 F (36.6 C)    TempSrc: Oral Oral    SpO2: 100% 100% 99%   Weight:      Height:        Intake/Output Summary (Last 24 hours) at 12/04/2022 0800 Last data filed at 12/04/2022 0300 Gross per 24 hour  Intake 480 ml  Output 420 ml  Net 60 ml    Filed Weights   12/02/22 0853  Weight: 60.4 kg    Examination:  General exam: NAD Respiratory system: CTA Cardiovascular system: S 1, S 2 RRR Gastrointestinal system: BS present, soft nt Central nervous system: Alert Extremities:  no edema   Data Reviewed: I have personally reviewed following labs and imaging studies  CBC: Recent Labs  Lab 12/02/22 0409 12/03/22 0420 12/04/22 0406  WBC 6.1 3.5* 4.7  NEUTROABS 3.3  --   --   HGB 11.8* 9.7* 9.1*  HCT 36.6* 29.0* 28.6*  MCV 100.5* 99.7 104.4*  PLT 252 175 137*    Basic Metabolic Panel: Recent Labs  Lab 12/02/22 0409 12/03/22 0733 12/04/22 0406  NA 131* 133* 135  K 4.4 3.3* 4.9  CL 99 104 105  CO2 13* 22 23  GLUCOSE 95 90 99  BUN 28* 20 21  CREATININE 1.38* 0.69 0.87  CALCIUM 9.0 8.3* 8.1*  MG 1.6*  --  1.4*  PHOS 5.1*  --   --     GFR: Estimated Creatinine Clearance: 70.4 mL/min (by C-G formula based on SCr of 0.87 mg/dL). Liver Function Tests: Recent Labs  Lab 12/02/22 0409 12/03/22 0733  AST 54* 29  ALT 21 17  ALKPHOS 77 63  BILITOT 1.5* 1.0  PROT 8.4* 6.8  ALBUMIN 4.1 3.4*    No results for input(s): "LIPASE", "AMYLASE" in the last 168 hours. Recent Labs  Lab 12/02/22 1027  AMMONIA 11    Coagulation Profile: No results for input(s): "INR", "PROTIME" in the last 168 hours. Cardiac Enzymes: No results for input(s):  "CKTOTAL", "CKMB", "CKMBINDEX", "TROPONINI" in the last 168 hours. BNP (last 3 results) No results for input(s): "PROBNP" in the last 8760 hours. HbA1C: Recent Labs    12/04/22 0406  HGBA1C 4.9   CBG: No results for input(s): "GLUCAP" in the last 168 hours. Lipid Profile: Recent Labs    12/04/22 0406  CHOL 121  HDL 60  LDLCALC 48  TRIG 63  CHOLHDL 2.0   Thyroid Function Tests: Recent Labs    12/02/22 1027  TSH 3.074    Anemia Panel: Recent Labs    12/02/22 1027  VITAMINB12 503    Sepsis Labs: Recent Labs  Lab 12/02/22 0450 12/02/22 0858  LATICACIDVEN 3.5* 1.2     No results found for this or any previous visit (from the past 240 hour(s)).       Radiology Studies: CT ANGIO HEAD NECK W WO CM  Result Date: 12/03/2022 CLINICAL DATA:  Stroke/TIA, determine embolic source. EXAM: CT ANGIOGRAPHY HEAD AND NECK WITH AND WITHOUT CONTRAST TECHNIQUE: Multidetector CT imaging of the head and neck was performed using the standard protocol during bolus administration of intravenous contrast. Multiplanar CT  image reconstructions and MIPs were obtained to evaluate the vascular anatomy. Carotid stenosis measurements (when applicable) are obtained utilizing NASCET criteria, using the distal internal carotid diameter as the denominator. RADIATION DOSE REDUCTION: This exam was performed according to the departmental dose-optimization program which includes automated exposure control, adjustment of the mA and/or kV according to patient size and/or use of iterative reconstruction technique. CONTRAST:  75mL OMNIPAQUE IOHEXOL 350 MG/ML SOLN COMPARISON:  Brain MRI from earlier today FINDINGS: CT HEAD FINDINGS Brain: No evidence of visible acute infarction, hemorrhage, hydrocephalus, extra-axial collection or mass lesion/mass effect. Brain atrophy especially affecting the cerebellum. Vascular: See below Skull: Normal. Negative for fracture or focal lesion. Sinuses/Orbits: No acute finding.  Review of the MIP images confirms the above findings CTA NECK FINDINGS Aortic arch: 3 vessel branching Right carotid system: Atheromatous calcification at the carotid bifurcation without stenosis or ulceration. Left carotid system: Atheromatous plaque at the bifurcation without stenosis or ulceration. Vertebral arteries: No proximal subclavian stenosis. The right vertebral artery is dominant. Vertebral arteries are smoothly contoured and widely patent Skeleton: No acute finding. Generalized degeneration of the cervical spine with C3-4 anterolisthesis. Other neck: Negative Upper chest: Calcified granuloma in the right upper lobe. Review of the MIP images confirms the above findings CTA HEAD FINDINGS Anterior circulation: No branch occlusion, beading, or flow limiting stenosis. Motion artifact above the level of the circle-of-Willis. Left MCA bifurcation aneurysm projecting anteriorly and measuring 2 mm. No evidence of vascular malformation. Posterior circulation: The vertebral and basilar arteries are smoothly contoured and widely patent. No branch occlusion, beading, aneurysm, or vascular malformation. Venous sinuses: Diffusely patent Anatomic variants: None significant Review of the MIP images confirms the above findings IMPRESSION: 1. No emergent arterial finding. 2. Atherosclerosis without flow limiting stenosis of major arteries in the head and neck. 3. 2 mm left MCA bifurcation aneurysm. Electronically Signed   By: Tiburcio Pea M.D.   On: 12/03/2022 15:39   MR BRAIN WO CONTRAST  Result Date: 12/03/2022 CLINICAL DATA:  67 year old male altered mental status. EXAM: MRI HEAD WITHOUT CONTRAST TECHNIQUE: Multiplanar, multiecho pulse sequences of the brain and surrounding structures were obtained without intravenous contrast. COMPARISON:  Brain MRI 08/16/2020.  Head CT 10/06/2022. FINDINGS: Brain: Tiny cortical or subcortical focus of diffusion restriction at the left superior frontal gyrus series 5, image 41,  correlated on coronal series 13, image 12 DWI. No T2 or FLAIR hyperintensity. No other diffusion restriction. No midline shift, mass effect, evidence of mass lesion, ventriculomegaly, extra-axial collection or acute intracranial hemorrhage. Cervicomedullary junction and pituitary are within normal limits. Cerebral volume is stable since 2022. Outside of the acute finding stable minimal to mild nonspecific white matter T2 and FLAIR hyperintensity in both hemispheres. No cortical encephalomalacia. No chronic cerebral blood products on SWI. However, there is questionable chronic atrophy of the cerebellar vermis. Vascular: Major intracranial vascular flow voids are preserved, stable since 2022. Skull and upper cervical spine: Cervical spine degeneration including C3-C4 spondylolisthesis and mild spinal stenosis on series 7, image 12. Visualized bone marrow signal is within normal limits. Sinuses/Orbits: Stable, negative. Other: Mastoids are clear. Visible internal auditory structures appear normal. Negative visible scalp and face. IMPRESSION: 1. Positive for a punctate Acute cortical/subcortical Infarct in the Left superior frontal gyrus (motor/pre motor area). No associated hemorrhage or mass effect. Query right side weakness. 2. Otherwise stable since 2022 and largely negative for age noncontrast MRI appearance of the brain; questionable chronic atrophy of the cerebellar vermis. 3. Cervical spine degeneration with C3-C4  spondylolisthesis and mild spinal stenosis. Electronically Signed   By: Odessa Fleming M.D.   On: 12/03/2022 06:35   EEG adult  Result Date: 12/02/2022 Charlsie Quest, MD     12/02/2022  5:58 PM Patient Name: RONDY OAKMAN MRN: [id number] Epilepsy Attending: Charlsie Quest Referring Physician/Provider: Bobette Mo, MD Date: 12/02/2022 Duration: 24.30 mins Patient history: 67yo m with ams getting eeg to evaluate for seizure Level of alertness: Awake AEDs during EEG study: None Technical aspects:  This EEG study was done with scalp electrodes positioned according to the 10-20 International system of electrode placement. Electrical activity was reviewed with band pass filter of 1-70Hz , sensitivity of 7 uV/mm, display speed of 80mm/sec with a 60Hz  notched filter applied as appropriate. EEG data were recorded continuously and digitally stored.  Video monitoring was available and reviewed as appropriate. Description: EEG showed continuous generalized 3 to 6 Hz theta-delta slowing. Hyperventilation and photic stimulation were not performed.   Of note, study was technically difficult due to significant electrode artifact secondary to matted hair  ABNORMALITY - Continuous slow, generalized IMPRESSION: This technically difficult study is  suggestive of moderate diffuse encephalopathy, nonspecific etiology. No seizures or epileptiform discharges were seen throughout the recording. Charlsie Quest   DG Skull 1-3 Views  Result Date: 12/02/2022 CLINICAL DATA:  MRI clearance EXAM: SKULL - 1-3 VIEW COMPARISON:  CT head 10/06/2022 FINDINGS: There is no evidence of skull fracture or other focal bone lesions. No metallic foreign bodies are demonstrated. Paranasal sinuses are clear. IMPRESSION: No metallic foreign bodies are demonstrated. Electronically Signed   By: Burman Nieves M.D.   On: 12/02/2022 17:25   DG Abd 1 View  Result Date: 12/02/2022 CLINICAL DATA:  Metal screening prior MRI. EXAM: ABDOMEN - 1 VIEW COMPARISON:  None Available. FINDINGS: There surgical clips and staple line seen in the left upper quadrant. There is likely pen external artifact overlying the right hip. There are vascular calcifications in the pelvis. Bowel-gas pattern is nonobstructive. No fractures are seen. IMPRESSION: 1.  Surgical clips and staple line in the left upper quadrant. 2. Pen artifact overlying the right hip. Please correlate clinically. Electronically Signed   By: Darliss Cheney M.D.   On: 12/02/2022 17:25         Scheduled Meds:   stroke: early stages of recovery book  1 each Does not apply Once   feeding supplement  237 mL Oral BID BM   ferrous sulfate  325 mg Oral Q breakfast   folic acid  1 mg Oral Daily   magnesium oxide  400 mg Oral BID   metoprolol tartrate  12.5 mg Oral BID   mometasone-formoterol  2 puff Inhalation BID   multivitamin with minerals  1 tablet Oral Daily   rivaroxaban  20 mg Oral Q supper   rosuvastatin  10 mg Oral Daily   tamsulosin  0.4 mg Oral QPC supper   thiamine  100 mg Oral Daily   Or   thiamine  100 mg Intravenous Daily   Continuous Infusions:  magnesium sulfate bolus IVPB       LOS: 0 days    Time spent: 35 minutes    Angelina Venard A Jemma Rasp, MD [facility]   If 7PM-7AM, please contact night-coverage [email or web address]  12/04/2022, 8:00 AM

## 2022-12-04 NOTE — Plan of Care (Signed)
  Problem: Health Behavior/Discharge Planning: Goal: Ability to safely manage health-related needs after discharge will improve Outcome: Progressing   Problem: Activity: Goal: Risk for activity intolerance will decrease Outcome: Progressing   Problem: Education: Goal: Knowledge of disease or condition will improve Outcome: Adequate for Discharge Goal: Understanding of medication regimen will improve Outcome: Adequate for Discharge   Problem: Activity: Goal: Ability to tolerate increased activity will improve Outcome: Adequate for Discharge   Problem: Education: Goal: Knowledge of General Education information will improve Description: Including pain rating scale, medication(s)/side effects and non-pharmacologic comfort measures Outcome: Adequate for Discharge   Problem: Nutrition: Goal: Adequate nutrition will be maintained Outcome: Adequate for Discharge   Problem: Coping: Goal: Level of anxiety will decrease Outcome: Adequate for Discharge   Problem: Safety: Goal: Ability to remain free from injury will improve Outcome: Adequate for Discharge

## 2022-12-04 NOTE — Evaluation (Addendum)
Occupational Therapy Evaluation Patient Details Name: Anthony [name] MRN: [id number] DOB: Nov 03, 1955 Today's Date: 12/04/2022   History of Present Illness Anthony [name] is a 66 yr old male brought to the hospital with AMS. He was found to have a fib with RVR. A MRI of the brain revealed:  "positive for a punctate Acute cortical/subcortical Infarct in the left superior frontal gyrus (motor/pre motor area). No associated hemorrhage or mass effect. Query right side weakness." PMH: rhabdo, skin CA, trigger finger, actinic keratosis, ETOH abuse, ETOH withdrawal, anxiety, depression, midesophagus foreign body, L rib fractures   Clinical Impression   The pt was noted to be with unsteadiness in standing, dizziness that increased with progressive activity, reports of chronic back pain from old vertebral compression fractures, reports of shortness of breath with activity. His heart rate was noted to be up to 135 bpm with activity and his O2 saturation was 100% on room air. His blood pressure was taken at the end of the session in supine and noted to be 99/77. He required redirection to tasks throughout, as well as cues for general safety awareness and insight, due to verbal distractibility. At current, he would be a high falls risk if he were to be alone upon discharge. OT will continue to follow him for further services to maximize his safety and independence with self-care tasks.      Recommendations for follow up therapy are one component of a multi-disciplinary discharge planning process, led by the attending physician.  Recommendations may be updated based on patient status, additional functional criteria and insurance authorization.   Assistance Recommended at Discharge Frequent or constant Supervision/Assistance  Patient can return home with the following Assist for transportation;Direct supervision/assist for medications management;Assistance with cooking/housework;A Anthony [name] help with walking and/or  transfers;A Anthony [name] help with bathing/dressing/bathroom    Functional Status Assessment  Patient has had a recent decline in their functional status and demonstrates the ability to make significant improvements in function in a reasonable and predictable amount of time.  Equipment Recommendations  None recommended by OT       Precautions / Restrictions Precautions Precautions: Fall Restrictions Weight Bearing Restrictions: No      Mobility Bed Mobility Overal bed mobility: Needs Assistance Bed Mobility: Supine to Sit, Sit to Supine     Supine to sit: Supervision Sit to supine: Supervision        Transfers Overall transfer level: Needs assistance   Transfers: Sit to/from Stand Sit to Stand: Min guard                  Balance Overall balance assessment: Needs assistance   Sitting balance-Anthony [name] Scale: Good       Standing balance-Anthony [name] Scale: Poor              ADL either performed or assessed with clinical judgement   ADL Overall ADL's : Needs assistance/impaired Eating/Feeding: Independent;Sitting Eating/Feeding Details (indicate cue type and reason): based on clinical judgement Grooming: Set up;Sitting Grooming Details (indicate cue type and reason): simulated seated EOB         Upper Body Dressing : Set up;Sitting Upper Body Dressing Details (indicate cue type and reason): simulated seated EOB Lower Body Dressing: Minimal assistance;Sit to/from stand                       Vision   Additional Comments: He denied having acute vision changes or deficits.            Pertinent  Vitals/Pain Pain Assessment Pain Assessment: 0-10 Pain Score: 5  Pain Location: chronic back pain from reported 3 old vertebral fractures Pain Intervention(s): Monitored during session, Limited activity within patient's tolerance     Hand Dominance Right   Extremity/Trunk Assessment Upper Extremity Assessment Upper Extremity Assessment: Overall WFL for  tasks assessed   Lower Extremity Assessment Lower Extremity Assessment: Overall WFL for tasks assessed   *He denied having acute sensation changes or deficits of his BUE and BLE.      Communication Communication Communication: No difficulties   Cognition Arousal/Alertness: Awake/alert   Overall Cognitive Status: No family/caregiver present to determine baseline cognitive functioning Area of Impairment: Attention, Safety/judgement, Awareness        Safety/Judgement: Decreased awareness of safety     General Comments: He was grossly oriented and able to follow 1-2 step commands consistently. He presented with decreased safety awareness and insight. He required frequent redirection to tasks & topics, due to be distractible and hyperverbose     General Comments               Home Living       Type of Home: Homeless Home Access: Stairs to enter          Home Equipment: Anthony [name] - single point   Additional Comments: His living situation did not appear clear, as he gave conflicting reports. Initially, he reported living "all over", then he later described living in a single "room" with other individuals in the same home who have their separate bedrooms; he stated he resides on the main level of the home & there is a shared kitchen and bathroom.  He stated there are 4 stairs with a rail or 2 stairs with no rail to enter the home from outside.      Prior Functioning/Environment Prior Level of Function : Independent/Modified Independent             Mobility Comments: He reported ambulating without an assistive device, however stated he has a cane that he should use more. ADLs Comments: He reported being independent with ADLs, cooking, and cleaning.        OT Problem List: Decreased activity tolerance;Impaired balance (sitting and/or standing);Decreased cognition;Decreased safety awareness;Decreased knowledge of use of DME or AE;Pain      OT Treatment/Interventions:  Self-care/ADL training;Therapeutic exercise;Patient/family education;Energy conservation;Therapeutic activities;DME and/or AE instruction;Cognitive remediation/compensation    OT Goals(Current goals can be found in the care plan section) Acute Rehab OT Goals OT Goal Formulation: With patient Time For Goal Achievement: 12/18/22 Potential to Achieve Goals: Good ADL Goals Pt Will Perform Grooming: with supervision;standing Pt Will Perform Lower Body Dressing: with supervision;sit to/from stand Pt Will Transfer to Toilet: with supervision;ambulating Pt Will Perform Toileting - Clothing Manipulation and hygiene: with supervision;sit to/from stand  OT Frequency: Min 1X/week       AM-PAC OT "6 Clicks" Daily Activity     Outcome Measure Help from another person eating meals?: None Help from another person taking care of personal grooming?: A Hershman Help from another person toileting, which includes using toliet, bedpan, or urinal?: A [place] Help from another person bathing (including washing, rinsing, drying)?: A Lot Help from another person to put on and taking off regular upper body clothing?: None Help from another person to put on and taking off regular lower body clothing?: A [place] 6 Click Score: 19   End of Session Equipment Utilized During Treatment: Rolling walker (2 wheels);Gait belt Nurse Communication: Mobility status  Activity Tolerance: Other (  comment) (Limited by dizziness) Patient left: in bed;with call bell/phone within reach;with bed alarm set  OT Visit Diagnosis: Unsteadiness on feet (R26.81);History of falling (Z91.81)                Time: 1610-9604 OT Time Calculation (min): 26 min Charges:  OT General Charges $OT Visit: 1 Visit OT Evaluation $OT Eval Moderate Complexity: 1 Mod OT Treatments $Therapeutic Activity: 8-22 mins    Reuben Likes, OTR/L 12/04/2022, 12:58 PM

## 2022-12-04 NOTE — Progress Notes (Signed)
We found a bottle of mixed medications spilt in bed with pt. The bottle label states that are tylenol, but there were two different types. Mr. Anthony [name] told us that one is day tylenol and one is night tylenol. Pt claimed he did not take any, but is slightly confused at the moment so will hold tylenol for now. Med sent to pharmacy.

## 2022-12-05 DIAGNOSIS — I5032 Chronic diastolic (congestive) heart failure: Secondary | ICD-10-CM | POA: Diagnosis present

## 2022-12-05 DIAGNOSIS — E872 Acidosis, unspecified: Secondary | ICD-10-CM | POA: Diagnosis present

## 2022-12-05 DIAGNOSIS — N179 Acute kidney failure, unspecified: Secondary | ICD-10-CM | POA: Diagnosis present

## 2022-12-05 DIAGNOSIS — G9341 Metabolic encephalopathy: Secondary | ICD-10-CM | POA: Diagnosis present

## 2022-12-05 DIAGNOSIS — I110 Hypertensive heart disease with heart failure: Secondary | ICD-10-CM | POA: Diagnosis present

## 2022-12-05 DIAGNOSIS — I700 Atherosclerosis of aorta: Secondary | ICD-10-CM | POA: Diagnosis present

## 2022-12-05 DIAGNOSIS — F329 Major depressive disorder, single episode, unspecified: Secondary | ICD-10-CM | POA: Diagnosis present

## 2022-12-05 DIAGNOSIS — R0602 Shortness of breath: Secondary | ICD-10-CM | POA: Diagnosis present

## 2022-12-05 DIAGNOSIS — E860 Dehydration: Secondary | ICD-10-CM | POA: Diagnosis present

## 2022-12-05 DIAGNOSIS — I2510 Atherosclerotic heart disease of native coronary artery without angina pectoris: Secondary | ICD-10-CM | POA: Diagnosis present

## 2022-12-05 DIAGNOSIS — I4892 Unspecified atrial flutter: Secondary | ICD-10-CM | POA: Diagnosis not present

## 2022-12-05 DIAGNOSIS — F419 Anxiety disorder, unspecified: Secondary | ICD-10-CM | POA: Diagnosis not present

## 2022-12-05 DIAGNOSIS — F1010 Alcohol abuse, uncomplicated: Secondary | ICD-10-CM | POA: Diagnosis not present

## 2022-12-05 DIAGNOSIS — I6350 Cerebral infarction due to unspecified occlusion or stenosis of unspecified cerebral artery: Secondary | ICD-10-CM | POA: Diagnosis present

## 2022-12-05 DIAGNOSIS — E871 Hypo-osmolality and hyponatremia: Secondary | ICD-10-CM | POA: Diagnosis present

## 2022-12-05 DIAGNOSIS — I483 Typical atrial flutter: Secondary | ICD-10-CM | POA: Diagnosis not present

## 2022-12-05 DIAGNOSIS — I480 Paroxysmal atrial fibrillation: Secondary | ICD-10-CM | POA: Diagnosis present

## 2022-12-05 DIAGNOSIS — F1024 Alcohol dependence with alcohol-induced mood disorder: Secondary | ICD-10-CM | POA: Diagnosis not present

## 2022-12-05 DIAGNOSIS — I951 Orthostatic hypotension: Secondary | ICD-10-CM | POA: Diagnosis present

## 2022-12-05 DIAGNOSIS — E876 Hypokalemia: Secondary | ICD-10-CM | POA: Diagnosis present

## 2022-12-05 DIAGNOSIS — D539 Nutritional anemia, unspecified: Secondary | ICD-10-CM | POA: Diagnosis present

## 2022-12-05 DIAGNOSIS — I639 Cerebral infarction, unspecified: Secondary | ICD-10-CM | POA: Diagnosis not present

## 2022-12-05 DIAGNOSIS — F10139 Alcohol abuse with withdrawal, unspecified: Secondary | ICD-10-CM | POA: Diagnosis present

## 2022-12-05 DIAGNOSIS — D696 Thrombocytopenia, unspecified: Secondary | ICD-10-CM | POA: Diagnosis not present

## 2022-12-05 DIAGNOSIS — F332 Major depressive disorder, recurrent severe without psychotic features: Secondary | ICD-10-CM | POA: Diagnosis not present

## 2022-12-05 DIAGNOSIS — G312 Degeneration of nervous system due to alcohol: Secondary | ICD-10-CM | POA: Diagnosis present

## 2022-12-05 DIAGNOSIS — E785 Hyperlipidemia, unspecified: Secondary | ICD-10-CM | POA: Diagnosis present

## 2022-12-05 DIAGNOSIS — I4891 Unspecified atrial fibrillation: Secondary | ICD-10-CM | POA: Diagnosis not present

## 2022-12-05 DIAGNOSIS — Z5901 Sheltered homelessness: Secondary | ICD-10-CM | POA: Diagnosis not present

## 2022-12-05 DIAGNOSIS — F1014 Alcohol abuse with alcohol-induced mood disorder: Secondary | ICD-10-CM | POA: Diagnosis not present

## 2022-12-05 DIAGNOSIS — Z681 Body mass index (BMI) 19 or less, adult: Secondary | ICD-10-CM | POA: Diagnosis not present

## 2022-12-05 DIAGNOSIS — E440 Moderate protein-calorie malnutrition: Secondary | ICD-10-CM | POA: Diagnosis present

## 2022-12-05 LAB — BASIC METABOLIC PANEL
Anion gap: 8 (ref 5–15)
BUN: 18 mg/dL (ref 8–23)
CO2: 22 mmol/L (ref 22–32)
Calcium: 8.6 mg/dL — ABNORMAL LOW (ref 8.9–10.3)
Chloride: 105 mmol/L (ref 98–111)
Creatinine, Ser: 0.6 mg/dL — ABNORMAL LOW (ref 0.61–1.24)
GFR, Estimated: >60 mL/min (ref >60)
Glucose, Bld: 92 mg/dL (ref 70–99)
Potassium: 4.2 mmol/L (ref 3.5–5.1)
Sodium: 135 mmol/L (ref 135–145)

## 2022-12-05 LAB — CBC
HCT: 30.6 % — ABNORMAL LOW (ref 39.0–52.0)
Hemoglobin: 10 g/dL — ABNORMAL LOW (ref 13.0–17.0)
MCH: 33.8 pg (ref 26.0–34.0)
MCHC: 32.7 g/dL (ref 30.0–36.0)
MCV: 103.4 fL — ABNORMAL HIGH (ref 80.0–100.0)
Platelets: 155 10*3/uL (ref 150–400)
RBC: 2.96 MIL/uL — ABNORMAL LOW (ref 4.22–5.81)
RDW: 20.8 % — ABNORMAL HIGH (ref 11.5–15.5)
WBC: 4.6 10*3/uL (ref 4.0–10.5)
nRBC: 0 % (ref 0.0–0.2)

## 2022-12-05 LAB — MAGNESIUM: Magnesium: 1.5 mg/dL — ABNORMAL LOW (ref 1.7–2.4)

## 2022-12-05 MED ORDER — LORAZEPAM 2 MG/ML IJ SOLN: 0.5000 mg | INTRAMUSCULAR | Status: DC | PRN

## 2022-12-05 MED ORDER — THIAMINE HCL 100 MG/ML IJ SOLN
500.0000 mg | Freq: Every day | INTRAVENOUS | Status: AC
  Administered 2022-12-05 – 2022-12-08 (×4): 500 mg via INTRAVENOUS
  Filled 2022-12-05 (×4): qty 5

## 2022-12-05 MED ORDER — ACETAMINOPHEN 500 MG PO TABS
500.0000 mg | ORAL_TABLET | Freq: Three times a day (TID) | ORAL | Status: DC
  Administered 2022-12-05 – 2022-12-07 (×6): 500 mg via ORAL
  Filled 2022-12-05 (×6): qty 1

## 2022-12-05 MED ORDER — MAGNESIUM SULFATE 2 GM/50ML IV SOLN
2.0000 g | Freq: Once | INTRAVENOUS | Status: AC
  Administered 2022-12-05: 2 g via INTRAVENOUS
  Filled 2022-12-05: qty 50

## 2022-12-05 NOTE — Evaluation (Signed)
Physical Therapy Evaluation Patient Details Name: Anthony [name] MRN: [id number] DOB: Jan 13, 1956 Today's Date: 12/05/2022  History of Present Illness  Anthony [name] is a 67 yr old male brought to the hospital with AMS. He was found to have a fib with RVR. PMH: rhabdo, skin CA, trigger finger, actinic keratosis, ETOH abuse, ETOH withdrawal, anxiety, depression, midesophagus foreign body, L rib fractures   Clinical Impression  Patient evaluated by Physical Therapy with no further acute PT needs identified. All education has been completed and the patient has no further questions. Pt demonstrated modified independence for all mobility tasks today including ambulation in hallway 362ft without device, limited only by fatigue and mild SOB. Pt demonstrated 5/5 strength in all extremities and light touch sensation WNL. Added pt to mobility specialist caseload to work on activity tolerance. Would appreciate TOC input especially for discharge destination as pt reporting "I'm not going back to that house, I'd rather go to jail;" additionally, pt would benefit from 4WRW (rollator) to allow for ambulation and rest/recovery periods as well as transportation of personal effects but unsure as to procurement process given indigence. PT is signing off. Thank you for this referral.       Recommendations for follow up therapy are one component of a multi-disciplinary discharge planning process, led by the attending physician.  Recommendations may be updated based on patient status, additional functional criteria and insurance authorization.  Follow Up Recommendations       Assistance Recommended at Discharge None  Patient can return home with the following       Equipment Recommendations Other (comment);Rollator (4 wheels) (Pt may benefit from Rollator to improve activity tolerance and allow pt to rest and recover during ambulation, however, pt expressed difficulty maintaining ownership of a SPC. Appreciate TOC input  and possibility of donation from DME company.)  Recommendations for Other Services       Functional Status Assessment Patient has not had a recent decline in their functional status     Precautions / Restrictions Precautions Precautions: Fall Restrictions Weight Bearing Restrictions: No      Mobility  Bed Mobility Overal bed mobility: Modified Independent             General bed mobility comments: Upon entry pt sitting EOB asking for a shower.    Transfers Overall transfer level: Modified independent                 General transfer comment: Increased time    Ambulation/Gait Ambulation/Gait assistance: Modified independent (Device/Increase time) Gait Distance (Feet): 300 Feet Assistive device: None Gait Pattern/deviations: Step-through pattern, Drifts right/left Gait velocity: decreased     General Gait Details: Pt ambulated 32ft at mod I level wtih some drifting R and L but otherwise no significant gait deviations noted, pt expressing SOB but otherwise without complaint.  Stairs            Wheelchair Mobility    Modified Rankin (Stroke Patients Only)       Balance Overall balance assessment: Needs assistance   Sitting balance-Leahy Scale: Good       Standing balance-Leahy Scale: Fair                               Pertinent Vitals/Pain Pain Assessment Pain Assessment: No/denies pain    Home Living Family/patient expects to be discharged to:: Shelter/Homeless     Type of Home: Homeless  Additional Comments: His living situation did not appear clear, as he gave conflicting reports. Initially, he reported living "all over", then he later described living in a single "room" with other individuals in the same home who have their separate bedrooms; he stated he resides on the main level of the home & there is a shared kitchen and bathroom.  He stated there are 4 stairs with a rail or 2 stairs with no rail to  enter the home from outside.    Prior Function Prior Level of Function : Independent/Modified Independent             Mobility Comments: He reported ambulating without an assistive device, however stated he has a cane that he should use more. ADLs Comments: He reported being independent with ADLs, cooking, and cleaning.     Hand Dominance   Dominant Hand: Right    Extremity/Trunk Assessment   Upper Extremity Assessment Upper Extremity Assessment: Overall WFL for tasks assessed    Lower Extremity Assessment Lower Extremity Assessment: Overall WFL for tasks assessed    Cervical / Trunk Assessment Cervical / Trunk Assessment: Normal  Communication   Communication: No difficulties  Cognition Arousal/Alertness: Awake/alert   Overall Cognitive Status: No family/caregiver present to determine baseline cognitive functioning Area of Impairment: Attention, Safety/judgement, Awareness                   Current Attention Level: Sustained     Safety/Judgement: Decreased awareness of safety     General Comments: He was grossly oriented and able to follow 1-2 step commands consistently. He presented with decreased safety awareness and insight. He required frequent redirection to tasks & topics, due to be distractible and hyperverbose        General Comments      Exercises     Assessment/Plan    PT Assessment All further PT needs can be met in the next venue of care  PT Problem List Decreased activity tolerance;Decreased cognition       PT Treatment Interventions      PT Goals (Current goals can be found in the Care Plan section)  Acute Rehab PT Goals Patient Stated Goal: To recover and find a place to live.    Frequency       Co-evaluation               AM-PAC PT "6 Clicks" Mobility  Outcome Measure Help needed turning from your back to your side while in a flat bed without using bedrails?: None Help needed moving from lying on your back to  sitting on the side of a flat bed without using bedrails?: None Help needed moving to and from a bed to a chair (including a wheelchair)?: None Help needed standing up from a chair using your arms (e.g., wheelchair or bedside chair)?: None Help needed to walk in hospital room?: A Joens Help needed climbing 3-5 steps with a railing? : A Birge 6 Click Score: 22    End of Session Equipment Utilized During Treatment: Gait belt Activity Tolerance: Patient tolerated treatment well Patient left: with nursing/sitter in room (Preparing to shower) Nurse Communication: Mobility status PT Visit Diagnosis: Unsteadiness on feet (R26.81)    Time: 1610-9604 PT Time Calculation (min) (ACUTE ONLY): 15 min   Charges:   PT Evaluation $PT Eval Low Complexity: 1 Low          Jamesetta Geralds, PT, DPT [facility] Office: [phone]  Jamesetta Geralds 12/05/2022, 2:06 PM

## 2022-12-05 NOTE — Progress Notes (Signed)
PROGRESS NOTE    Anthony [name]  UEA:[id number] DOB: January 23, 1956 DOA: 12/02/2022 PCP: Pcp, No   Brief Narrative: 67 year old with past medical history significant for actinic keratoses, alcohol abuse, alcohol withdrawal, anxiety, depression, hiatal hernia, pulmonary embolism, surgically corrected hydrocephalus, hypertension, hypokalemia, lumbar compression fracture, pancytopenia, rhabdomyolysis, tobacco use disorder, who presented to the ED brought by [facility] due to altered mental status.  Patient has not been taking his medications.  He was stating that there was a religious group outside of his window and people trying to break into his home.  He drinks 6-12 beers daily but have not been drinking for the last 2 days.  Evaluation in the ED he was found to have A-fib with RVR.  He was a started on Cardizem drip and subsequently transitioned to oral Cardizem. MRI was positive for a small stroke.  Neurology has been consulted. Patient was also evaluated by psych who recommended medical evaluation for hallucination but likely could be related to alcoholic hallucinations.  No inpatient facility required.   Assessment & Plan:   Principal Problem:   Atrial fibrillation with RVR (HCC) Active Problems:   HTN (hypertension)   Paroxysmal atrial fibrillation (HCC)   Hypomagnesemia   Dyslipidemia   Macrocytic anemia   Alcohol abuse with alcohol-induced mood disorder (HCC)   Tobacco use disorder   Hyponatremia   Aortic atherosclerosis (HCC)   Abnormal LFTs   Stroke (cerebrum) (HCC)  1-Paroxysmal A-fib with RVR: -Continue with Xarelto -Initially on Cardizem drip and subsequently transition to oral Cardizem twice daily--Discontinue due to Hypotension.  -Continue with metoprolol , hopefully BP will tolerates it.  -Cardiology consulted, Hypotension limiting management of A fib. He has prior HO of orthostatic hypotension and was on midodrine in the past.  -Metoprolol Increase to 25 mg BID, he has been  tolerating it.   2-Hallucination: Could be in the setting of alcohol used. Acute metabolic encephalopathy -Evaluated by psych does not require inpatient hospitalization. -Recommended ammonia, EEG, MRI: Ammonia negative EEG evidence of encephalopathy.  MRI with a small acute stroke per neurology would not explain symptoms. -suspect related to alcohol use.  -remain confuse, agitated. Will resume IV ativan. Will order high dose IV Thiamine.   3-Alcohol abuse with alcohol-induced mood disorder: Patient presented with probably alcoholic hallucination, chronic alcoholic encephalopathy Continue with CIWA--score 3 Continue with folic acid and thiamine  4-Abnormal liver function test in the setting of alcohol use: Follow trend Normalized.   5-Incidental acute small stroke: Acute punctate cortical/subcortical infarct in the left superior frontal gyrus -Okay to continue with Xarelto, discussed with neurology -Neurology has been consulted plan to proceed with CT angio head and neck A1c: 4.9, LDL 48 On xarelto. No need for ECHp, had recent echo.  CTA head and neck no large vessel occlusion.   Hypertension: Continue with metoprolol  Hyponatremia: in setting alcohol use. Improving.   Hypomagnesemia: Replete IV again today. On oral  supplement.   Dyslipidemia Aortic atherosclerosis (HCC) Continue rosuvastatin    Macrocytic anemia Monitor hematocrit and hemoglobin. Nutritional supplementation.   Tobacco use disorder Uses smokeless tobacco. Cessation advised.    Hypotension; BP drop to 70 5/16, improved with IV fluids. Cardizem discontinue. He has had previous history of hypotension requiring midodrine,. Might have to consider start midodrine.   Chronic Back pain; schedule tylenol. Keep PRN Robaxin   Estimated body mass index is 19.66 kg/m as calculated from the following:   Height as of this encounter: 5\' 9"  (1.753 m).   Weight as  of this encounter: 60.4 kg.   DVT prophylaxis:  Xarelto Code Status: Full code Family Communication: Care discussed with patient Disposition Plan:  Status is: Observation The patient remains OBS appropriate and will d/c before 2 midnights.    Consultants:  Psych Neurology   Procedures:    Antimicrobials:    Subjective: He is alert, confuse. Complaints of back pain, chronic. He is asking for schedule Robaxin. We discussed about keep it PRN  Objective: Vitals:   12/04/22 1226 12/04/22 2045 12/05/22 0546 12/05/22 0851  BP: 117/86  (!) 128/92   Pulse: 84  86   Resp: 18  18   Temp: 97.6 F (36.4 C)  98.6 F (37 C)   TempSrc: Oral  Oral   SpO2: 100% 98% 99% 97%  Weight:      Height:        Intake/Output Summary (Last 24 hours) at 12/05/2022 1159 Last data filed at 12/04/2022 1918 Gross per 24 hour  Intake 360 ml  Output --  Net 360 ml    Filed Weights   12/02/22 0853  Weight: 60.4 kg    Examination:  General exam: NAD Respiratory system: CTA Cardiovascular system:  S 1, S 2 IRR Gastrointestinal system: BS present, soft, nt Central nervous system: alert Extremities: No edema   Data Reviewed: I have personally reviewed following labs and imaging studies  CBC: Recent Labs  Lab 12/02/22 0409 12/03/22 0420 12/04/22 0406 12/05/22 0857  WBC 6.1 3.5* 4.7 4.6  NEUTROABS 3.3  --   --   --   HGB 11.8* 9.7* 9.1* 10.0*  HCT 36.6* 29.0* 28.6* 30.6*  MCV 100.5* 99.7 104.4* 103.4*  PLT 252 175 137* 155    Basic Metabolic Panel: Recent Labs  Lab 12/02/22 0409 12/03/22 0733 12/04/22 0406 12/05/22 0857  NA 131* 133* 135 135  K 4.4 3.3* 4.9 4.2  CL 99 104 105 105  CO2 13* 22 23 22   GLUCOSE 95 90 99 92  BUN 28* 20 21 18   CREATININE 1.38* 0.69 0.87 0.60*  CALCIUM 9.0 8.3* 8.1* 8.6*  MG 1.6*  --  1.4* 1.5*  PHOS 5.1*  --   --   --     GFR: Estimated Creatinine Clearance: 76.5 mL/min (A) (by C-G formula based on SCr of 0.6 mg/dL (L)). Liver Function Tests: Recent Labs  Lab 12/02/22 0409  12/03/22 0733  AST 54* 29  ALT 21 17  ALKPHOS 77 63  BILITOT 1.5* 1.0  PROT 8.4* 6.8  ALBUMIN 4.1 3.4*    No results for input(s): "LIPASE", "AMYLASE" in the last 168 hours. Recent Labs  Lab 12/02/22 1027  AMMONIA 11    Coagulation Profile: No results for input(s): "INR", "PROTIME" in the last 168 hours. Cardiac Enzymes: No results for input(s): "CKTOTAL", "CKMB", "CKMBINDEX", "TROPONINI" in the last 168 hours. BNP (last 3 results) No results for input(s): "PROBNP" in the last 8760 hours. HbA1C: Recent Labs    12/04/22 0406  HGBA1C 4.9    CBG: No results for input(s): "GLUCAP" in the last 168 hours. Lipid Profile: Recent Labs    12/04/22 0406  CHOL 121  HDL 60  LDLCALC 48  TRIG 63  CHOLHDL 2.0    Thyroid Function Tests: No results for input(s): "TSH", "T4TOTAL", "FREET4", "T3FREE", "THYROIDAB" in the last 72 hours.  Anemia Panel: No results for input(s): "VITAMINB12", "FOLATE", "FERRITIN", "TIBC", "IRON", "RETICCTPCT" in the last 72 hours.  Sepsis Labs: Recent Labs  Lab 12/02/22 0450 12/02/22 1610  LATICACIDVEN 3.5* 1.2     No results found for this or any previous visit (from the past 240 hour(s)).       Radiology Studies: CT ANGIO HEAD NECK W WO CM  Result Date: 12/03/2022 CLINICAL DATA:  Stroke/TIA, determine embolic source. EXAM: CT ANGIOGRAPHY HEAD AND NECK WITH AND WITHOUT CONTRAST TECHNIQUE: Multidetector CT imaging of the head and neck was performed using the standard protocol during bolus administration of intravenous contrast. Multiplanar CT image reconstructions and MIPs were obtained to evaluate the vascular anatomy. Carotid stenosis measurements (when applicable) are obtained utilizing NASCET criteria, using the distal internal carotid diameter as the denominator. RADIATION DOSE REDUCTION: This exam was performed according to the departmental dose-optimization program which includes automated exposure control, adjustment of the mA and/or  kV according to patient size and/or use of iterative reconstruction technique. CONTRAST:  75mL OMNIPAQUE IOHEXOL 350 MG/ML SOLN COMPARISON:  Brain MRI from earlier today FINDINGS: CT HEAD FINDINGS Brain: No evidence of visible acute infarction, hemorrhage, hydrocephalus, extra-axial collection or mass lesion/mass effect. Brain atrophy especially affecting the cerebellum. Vascular: See below Skull: Normal. Negative for fracture or focal lesion. Sinuses/Orbits: No acute finding. Review of the MIP images confirms the above findings CTA NECK FINDINGS Aortic arch: 3 vessel branching Right carotid system: Atheromatous calcification at the carotid bifurcation without stenosis or ulceration. Left carotid system: Atheromatous plaque at the bifurcation without stenosis or ulceration. Vertebral arteries: No proximal subclavian stenosis. The right vertebral artery is dominant. Vertebral arteries are smoothly contoured and widely patent Skeleton: No acute finding. Generalized degeneration of the cervical spine with C3-4 anterolisthesis. Other neck: Negative Upper chest: Calcified granuloma in the right upper lobe. Review of the MIP images confirms the above findings CTA HEAD FINDINGS Anterior circulation: No branch occlusion, beading, or flow limiting stenosis. Motion artifact above the level of the circle-of-Willis. Left MCA bifurcation aneurysm projecting anteriorly and measuring 2 mm. No evidence of vascular malformation. Posterior circulation: The vertebral and basilar arteries are smoothly contoured and widely patent. No branch occlusion, beading, aneurysm, or vascular malformation. Venous sinuses: Diffusely patent Anatomic variants: None significant Review of the MIP images confirms the above findings IMPRESSION: 1. No emergent arterial finding. 2. Atherosclerosis without flow limiting stenosis of major arteries in the head and neck. 3. 2 mm left MCA bifurcation aneurysm. Electronically Signed   By: Tiburcio Pea M.D.    On: 12/03/2022 15:39        Scheduled Meds:  feeding supplement  237 mL Oral BID BM   ferrous sulfate  325 mg Oral Q breakfast   folic acid  1 mg Oral Daily   magnesium oxide  400 mg Oral BID   metoprolol tartrate  25 mg Oral BID   mometasone-formoterol  2 puff Inhalation BID   multivitamin with minerals  1 tablet Oral Daily   rivaroxaban  20 mg Oral Q supper   rosuvastatin  10 mg Oral Daily   tamsulosin  0.4 mg Oral QPC supper   Continuous Infusions:  magnesium sulfate bolus IVPB     thiamine (VITAMIN B1) injection       LOS: 0 days    Time spent: 35 minutes    Benita Boonstra A Shannan Garfinkel, MD [facility]   If 7PM-7AM, please contact night-coverage [email or web address]  12/05/2022, 11:59 AM

## 2022-12-05 NOTE — Progress Notes (Signed)
Rounding Note    Patient Name: Anthony [name] Date of Encounter: 12/05/2022  [facility] Cardiologist: Verne Carrow, MD   Subjective   Pleasantly altered  Inpatient Medications    Scheduled Meds:  feeding supplement  237 mL Oral BID BM   ferrous sulfate  325 mg Oral Q breakfast   folic acid  1 mg Oral Daily   magnesium oxide  400 mg Oral BID   metoprolol tartrate  25 mg Oral BID   mometasone-formoterol  2 puff Inhalation BID   multivitamin with minerals  1 tablet Oral Daily   rivaroxaban  20 mg Oral Q supper   rosuvastatin  10 mg Oral Daily   tamsulosin  0.4 mg Oral QPC supper   thiamine  100 mg Oral Daily   Or   thiamine  100 mg Intravenous Daily   Continuous Infusions:  PRN Meds: acetaminophen **OR** acetaminophen, methocarbamol, ondansetron **OR** ondansetron (ZOFRAN) IV   Vital Signs    Vitals:   12/04/22 1226 12/04/22 2045 12/05/22 0546 12/05/22 0851  BP: 117/86  (!) 128/92   Pulse: 84  86   Resp: 18  18   Temp: 97.6 F (36.4 C)  98.6 F (37 C)   TempSrc: Oral  Oral   SpO2: 100% 98% 99% 97%  Weight:      Height:        Intake/Output Summary (Last 24 hours) at 12/05/2022 0855 Last data filed at 12/04/2022 1918 Gross per 24 hour  Intake 360 ml  Output 200 ml  Net 160 ml      12/02/2022    8:53 AM 10/10/2022    9:00 AM 10/10/2022    2:00 AM  Last 3 Weights  Weight (lbs) 133 lb 2.5 oz 147 lb 0.8 oz 149 lb 14.6 oz  Weight (kg) 60.4 kg 66.7 kg 68 kg      Telemetry    Rate controlled afib - Personally Reviewed  ECG    No new - Personally Reviewed  Physical Exam   Vitals:   12/05/22 0546 12/05/22 0851  BP: (!) 128/92   Pulse: 86   Resp: 18   Temp: 98.6 F (37 C)   SpO2: 99% 97%    GEN: No acute distress.  altered Neck: No JVD Cardiac: RRR, no murmurs, rubs, or gallops.  Respiratory: Clear to auscultation bilaterally. GI: Soft, nontender, non-distended  MS: No edema; No deformity. Neuro:  Nonfocal  Psych: Normal  affect   Labs    High Sensitivity Troponin:   Recent Labs  Lab 12/02/22 0409 12/02/22 0858  TROPONINIHS 11 10     Chemistry Recent Labs  Lab 12/02/22 0409 12/03/22 0733 12/04/22 0406  NA 131* 133* 135  K 4.4 3.3* 4.9  CL 99 104 105  CO2 13* 22 23  GLUCOSE 95 90 99  BUN 28* 20 21  CREATININE 1.38* 0.69 0.87  CALCIUM 9.0 8.3* 8.1*  MG 1.6*  --  1.4*  PROT 8.4* 6.8  --   ALBUMIN 4.1 3.4*  --   AST 54* 29  --   ALT 21 17  --   ALKPHOS 77 63  --   BILITOT 1.5* 1.0  --   GFRNONAA 56* >60 >60  ANIONGAP 19* 7 7    Lipids  Recent Labs  Lab 12/04/22 0406  CHOL 121  TRIG 63  HDL 60  LDLCALC 48  CHOLHDL 2.0    Hematology Recent Labs  Lab 12/02/22 0409 12/03/22 0420 12/04/22  0406  WBC 6.1 3.5* 4.7  RBC 3.64* 2.91* 2.74*  HGB 11.8* 9.7* 9.1*  HCT 36.6* 29.0* 28.6*  MCV 100.5* 99.7 104.4*  MCH 32.4 33.3 33.2  MCHC 32.2 33.4 31.8  RDW 19.6* 20.2* 20.8*  PLT 252 175 137*   Thyroid  Recent Labs  Lab 12/02/22 1027  TSH 3.074    BNP Recent Labs  Lab 12/02/22 0409  BNP 341.6*    DDimer No results for input(s): "DDIMER" in the last 168 hours.   Radiology    CT ANGIO HEAD NECK W WO CM  Result Date: 12/03/2022 CLINICAL DATA:  Stroke/TIA, determine embolic source. EXAM: CT ANGIOGRAPHY HEAD AND NECK WITH AND WITHOUT CONTRAST TECHNIQUE: Multidetector CT imaging of the head and neck was performed using the standard protocol during bolus administration of intravenous contrast. Multiplanar CT image reconstructions and MIPs were obtained to evaluate the vascular anatomy. Carotid stenosis measurements (when applicable) are obtained utilizing NASCET criteria, using the distal internal carotid diameter as the denominator. RADIATION DOSE REDUCTION: This exam was performed according to the departmental dose-optimization program which includes automated exposure control, adjustment of the mA and/or kV according to patient size and/or use of iterative reconstruction  technique. CONTRAST:  75mL OMNIPAQUE IOHEXOL 350 MG/ML SOLN COMPARISON:  Brain MRI from earlier today FINDINGS: CT HEAD FINDINGS Brain: No evidence of visible acute infarction, hemorrhage, hydrocephalus, extra-axial collection or mass lesion/mass effect. Brain atrophy especially affecting the cerebellum. Vascular: See below Skull: Normal. Negative for fracture or focal lesion. Sinuses/Orbits: No acute finding. Review of the MIP images confirms the above findings CTA NECK FINDINGS Aortic arch: 3 vessel branching Right carotid system: Atheromatous calcification at the carotid bifurcation without stenosis or ulceration. Left carotid system: Atheromatous plaque at the bifurcation without stenosis or ulceration. Vertebral arteries: No proximal subclavian stenosis. The right vertebral artery is dominant. Vertebral arteries are smoothly contoured and widely patent Skeleton: No acute finding. Generalized degeneration of the cervical spine with C3-4 anterolisthesis. Other neck: Negative Upper chest: Calcified granuloma in the right upper lobe. Review of the MIP images confirms the above findings CTA HEAD FINDINGS Anterior circulation: No Stefon Ramthun occlusion, beading, or flow limiting stenosis. Motion artifact above the level of the circle-of-Willis. Left MCA bifurcation aneurysm projecting anteriorly and measuring 2 mm. No evidence of vascular malformation. Posterior circulation: The vertebral and basilar arteries are smoothly contoured and widely patent. No Ted Goodner occlusion, beading, aneurysm, or vascular malformation. Venous sinuses: Diffusely patent Anatomic variants: None significant Review of the MIP images confirms the above findings IMPRESSION: 1. No emergent arterial finding. 2. Atherosclerosis without flow limiting stenosis of major arteries in the head and neck. 3. 2 mm left MCA bifurcation aneurysm. Electronically Signed   By: Tiburcio Pea M.D.   On: 12/03/2022 15:39    Cardiac Studies   Echo 08/2022     1.  Left ventricular ejection fraction, by estimation, is 60 to 65%. The  left ventricle has normal function. The left ventricle has no regional  wall motion abnormalities. Left ventricular diastolic parameters are  indeterminate.   2. Right ventricular systolic function is normal. The right ventricular  size is mildly enlarged. Tricuspid regurgitation signal is inadequate for  assessing PA pressure.   3. The mitral valve is normal in structure. No evidence of mitral valve  regurgitation. No evidence of mitral stenosis.   4. The aortic valve was not well visualized. Aortic valve regurgitation  is not visualized. No aortic stenosis is present.   Patient Profile  Anthony [name] is a 67 y.o. male with a hx of chronic diastolic CHF, paroxysmal A-fib, unprovoked PE, hypertension, chronic back pain, alcohol abuse with prior DTs, noncompliance, homelessness, paroxysmal A-fib who is being seen 12/04/2022 for the evaluation of A-fib RVR and hypotension at the request of Dr. Sunnie Nielsen.   Assessment & Plan    Paroxysmal A-fib with RVR -Patient was admitted with hallucinations, AMS, alcohol abuse with alcohol mood disorder noted to have A-fib RVR started on IV dilt -Patient has multiple admissions with A-fib RVR.  He has never followed up with cardiology in the outpatient setting -Outside the hospital, patient reports he does not take his cardiac medications.  Social situation is complicated by homelessness, psych disorders, poor understanding of medical conditions -PTA Xarelto, diltiazem 60 mg every 12 hours, Xarelto 20 mg daily, Lopressor 12.5 mg twice daily - on admission restarted on Xarelto  - Iv dilt was transitioned to oral, but this was stopped due to ?hypotension - Mag 1.4, goal>2 - goal K>4 - monitor Hgb and plt with Xarelto - BP has been occasionally soft - he is on metoprolol 12.5mg  BID>can increase to 25 mg BID --> today, he is in rate controlled afib.He is tolerating it. Can continue  current BB. Don't plan to cardiovert him at this time, while in etoh withdrawal/AMS   Incidental small stroke - acute punctate cortical/subcortical infarct in the left superior frontal gyrus - neurology saw and OK to continue with Xarelto - per IM/Neuro   Alcohol abuse - CIWA per IM - folic acid and thiamine   Hallucination AMS - possibly from alcohol withdrawal - stroke would not explain symptoms - evaluated by psych  Will follow peripherally over the weekend  For questions or updates, please contact [facility] Please consult [email or web address] for contact info under        Signed, Maisie Fus, MD  12/05/2022, 8:55 AM

## 2022-12-05 NOTE — Progress Notes (Signed)
.  Mobility Specialist - Progress Note   12/05/22 1519  Mobility  Activity Ambulated independently in hallway  Level of Assistance Independent  Assistive Device None  Distance Ambulated (ft) 350 ft  Activity Response Tolerated well  Mobility Referral Yes  $Mobility charge 1 Mobility  Mobility Specialist Stop Time (ACUTE ONLY) 0319   Pt received in bed and agreeable to mobility. No complaints during session. Pt to bed after session with all needs met.    [facility]

## 2022-12-06 DIAGNOSIS — F1010 Alcohol abuse, uncomplicated: Secondary | ICD-10-CM | POA: Diagnosis not present

## 2022-12-06 DIAGNOSIS — F332 Major depressive disorder, recurrent severe without psychotic features: Secondary | ICD-10-CM

## 2022-12-06 DIAGNOSIS — F419 Anxiety disorder, unspecified: Secondary | ICD-10-CM

## 2022-12-06 DIAGNOSIS — I4891 Unspecified atrial fibrillation: Secondary | ICD-10-CM | POA: Diagnosis not present

## 2022-12-06 LAB — BASIC METABOLIC PANEL
Anion gap: 9 (ref 5–15)
BUN: 24 mg/dL — ABNORMAL HIGH (ref 8–23)
CO2: 21 mmol/L — ABNORMAL LOW (ref 22–32)
Calcium: 8.7 mg/dL — ABNORMAL LOW (ref 8.9–10.3)
Chloride: 104 mmol/L (ref 98–111)
Creatinine, Ser: 0.82 mg/dL (ref 0.61–1.24)
GFR, Estimated: >60 mL/min (ref >60)
Glucose, Bld: 133 mg/dL — ABNORMAL HIGH (ref 70–99)
Potassium: 3.9 mmol/L (ref 3.5–5.1)
Sodium: 134 mmol/L — ABNORMAL LOW (ref 135–145)

## 2022-12-06 LAB — MAGNESIUM: Magnesium: 1.5 mg/dL — ABNORMAL LOW (ref 1.7–2.4)

## 2022-12-06 MED ORDER — MIRTAZAPINE 15 MG PO TABS
7.5000 mg | ORAL_TABLET | Freq: Every day | ORAL | Status: DC
  Administered 2022-12-06 – 2022-12-12 (×7): 7.5 mg via ORAL
  Filled 2022-12-06 (×7): qty 1

## 2022-12-06 MED ORDER — MAGNESIUM SULFATE 2 GM/50ML IV SOLN
2.0000 g | Freq: Once | INTRAVENOUS | Status: AC
  Administered 2022-12-06: 2 g via INTRAVENOUS
  Filled 2022-12-06: qty 50

## 2022-12-06 MED ORDER — METHOCARBAMOL 500 MG PO TABS
500.0000 mg | ORAL_TABLET | Freq: Three times a day (TID) | ORAL | Status: DC
  Administered 2022-12-06 – 2022-12-18 (×35): 500 mg via ORAL
  Filled 2022-12-06 (×36): qty 1

## 2022-12-06 NOTE — Consult Note (Signed)
Face-to-Face Psychiatry Consult   Reason for Consult:  "living dreams" Referring Physician:  Romie Jumper  Patient Identification: Anthony [name] MRN:  [id number] Principal Diagnosis: Atrial fibrillation with RVR (HCC) Diagnosis:  Principal Problem:   Atrial fibrillation with RVR (HCC) Active Problems:   HTN (hypertension)   Macrocytic anemia   Alcohol abuse with alcohol-induced mood disorder (HCC)   Tobacco use disorder   Paroxysmal atrial fibrillation (HCC)   Hypomagnesemia   Dyslipidemia   Hyponatremia   Aortic atherosclerosis (HCC)   Abnormal LFTs   Stroke (cerebrum) (HCC)   Total Time spent with patient: 30 minutes  Subjective:   Anthony [name] is a 67y/o male with a psychiatric history significant for alcohol use disorder, depression and anxiety who presents to the ED with complaint of dyspea, falls at home lately.   HPI:   Pt reports having "living dreams" that he thinks he was sleeping then he wakes up and the dreams continue. Reports this does not bother him significantly. Reports occurring for about 6 months. Denies any factors that precipitate, alleviate, or cause worsening of this (although alcohol use probably contributes to this and pt does report having problem with alcohol). Reports h/o insomnia (initial and middle) for some time. Denies nightmares.  Reports not taking any psych meds for sometime and cannot recall what/when he last took any psych meds.  Reports mood is down "I can't say I am depressed or I will be depressed". Reports low appetite. Denies SI, HI, AH, VH. Reports feeling that people come into the house where he lives are dangerous "I got a place to live through the [facility] and I live in 1 room on the first floor. It's a crack house."   Past Psychiatric History:  Dx: "depression, anxiety, alcohol [use disorder]" Denies h/o psych hosp Denies h/o suicide attempts   Risk to Self:  denies Risk to Others:  denies Prior Inpatient Therapy:  denies Prior Outpatient  Therapy:  denies  Past Medical History:  Past Medical History:  Diagnosis Date   Actinic keratosis 11/27/2016   Alcohol abuse with alcohol-induced mood disorder (HCC) 07/18/2018   Alcohol use disorder, severe, dependence (HCC) 09/16/2006   05/22/2015 Argumentative, belligerent and verbally abusive to staff per his note from [place]    Alcoholism [facility])    Anxiety state 04/25/2018   Aortic atherosclerosis (HCC) 08/27/2022   Chronic low back pain 09/16/2006   Followed by NS.  Per his chart from [place], he fell off two storyhouse roof while cleaning his brother's gutters and sustained compression fracture of L3, L4 and L5 and fracture of right transverse process at L3 and nondisplaced fracture of left glenoid rim many years ago. He had multiple imaging in [place] including Lumbar MRI in 2016 which showed moderate to severe spinal canal stenos   Delirium tremens (HCC) 09/01/2017   Depression    Dry eye 11/23/2016   Foreign body in middle portion of esophagus 05/19/2022   Hiatal hernia 09/14/2016   Status Collis gastroplasty and Nissen's fundoplication on 04/29/2015 in    History of delirium tremons 07/22/2020   DTs during 10/2016 08/2017. And 12/2017 admissions    History of pulmonary embolus (PE) 11/02/2016   Unprovoked 11/01/2016. Xarelto from 11/01/16 to 09/08/2017.   Hydrocele    Surgically corrected   Hypertension    Hypoalbuminemia due to protein-calorie malnutrition (HCC) 07/22/2020   Lumbar compression fracture (HCC)    Multiple rib fractures 07/22/2019   Left rib details XR: left ribs demonstrate multiple remote rib  Pancytopenia (HCC)    Rhabdomyolysis 07/22/2020   Skin cancer    exciced 2017   Tinea versicolor 06/08/2013   Tobacco use disorder 07/22/2020   Tooth infection 04/25/2018   Trigger finger, acquired 11/18/2012   Ulnar tunnel syndrome of right wrist 11/08/2012    Past Surgical History:  Procedure Laterality Date   BIOPSY  05/21/2022    Procedure: BIOPSY;  Surgeon: Lynann Bologna, MD;  Location: [facility] ENDOSCOPY;  Service: Gastroenterology;;   CATARACT EXTRACTION     ESOPHAGOGASTRODUODENOSCOPY (EGD) WITH PROPOFOL N/A 05/21/2022   Procedure: ESOPHAGOGASTRODUODENOSCOPY (EGD) WITH PROPOFOL;  Surgeon: Lynann Bologna, MD;  Location: [facility] ENDOSCOPY;  Service: Gastroenterology;  Laterality: N/A;   FOREIGN BODY REMOVAL N/A 05/21/2022   Procedure: FOREIGN BODY REMOVAL;  Surgeon: Lynann Bologna, MD;  Location: [facility] ENDOSCOPY;  Service: Gastroenterology;  Laterality: N/A;   HIATAL HERNIA REPAIR  2016   HYDROCELE EXCISION / REPAIR  2010   SKIN CANCER EXCISION Left    Excised 2017   Family History:  Family History  Problem Relation Age of Onset   Heart attack Father        died at 32 years   Dementia Father    Stroke Father    Cancer Sister    Colon cancer Neg Hx    Colon polyps Neg Hx    Esophageal cancer Neg Hx    Stomach cancer Neg Hx    Rectal cancer Neg Hx    Family Psychiatric  History: does not report  Social History:  Social History   Substance and Sexual Activity  Alcohol Use Yes   Alcohol/week: 43.0 standard drinks of alcohol   Types: 42 Cans of beer, 1 Standard drinks or equivalent per week     Social History   Substance and Sexual Activity  Drug Use Yes   Types: Marijuana    Social History   Socioeconomic History   Marital status: Single    Spouse name: Not on file   Number of children: Not on file   Years of education: Not on file   Highest education level: Not on file  Occupational History   Occupation: unemployed  Tobacco Use   Smoking status: Never   Smokeless tobacco: Current    Types: Snuff   Tobacco comments:    Pt states do snuff when stressed had 1 can last 3 month//PL  Vaping Use   Vaping Use: Never used  Substance and Sexual Activity   Alcohol use: Yes    Alcohol/week: 43.0 standard drinks of alcohol    Types: 42 Cans of beer, 1 Standard drinks or equivalent per week   Drug use: Yes     Types: Marijuana   Sexual activity: Yes    Birth control/protection: None  Other Topics Concern   Not on file  Social History Narrative   Lives with a friend. Has children but hasn't met them in a while. He doesn't know where they live.    Social Determinants of Health   Financial Resource Strain: Not on file  Food Insecurity: Food Insecurity Present (12/02/2022)   Hunger Vital Sign    Worried About Running Out of Food in the Last Year: Often true    Ran Out of Food in the Last Year: Often true  Transportation Needs: Unmet Transportation Needs (12/02/2022)   PRAPARE - Administrator, Civil Service (Medical): Yes    Lack of Transportation (Non-Medical): Yes  Physical Activity: Not on file  Stress: Not on file  Social  Connections: Not on file   Additional Social History:    Allergies:   Allergies  Allergen Reactions   Other Itching and Other (See Comments)    Seasonal allergies- Itchy eyes, runny nose, congestion   Poison Ivy Extract Rash    Labs:  Results for orders placed or performed during the hospital encounter of 12/02/22 (from the past 48 hour(s))  CBC     Status: Abnormal   Collection Time: 12/05/22  8:57 AM  Result Value Ref Range   WBC 4.6 4.0 - 10.5 K/uL   RBC 2.96 (L) 4.22 - 5.81 MIL/uL   Hemoglobin 10.0 (L) 13.0 - 17.0 g/dL   HCT 16.1 (L) 09.6 - 04.5 %   MCV 103.4 (H) 80.0 - 100.0 fL   MCH 33.8 26.0 - 34.0 pg   MCHC 32.7 30.0 - 36.0 g/dL   RDW 40.9 (H) 81.1 - 91.4 %   Platelets 155 150 - 400 K/uL   nRBC 0.0 0.0 - 0.2 %    Comment: Performed at [facility], [address] [place], [place] [address]  Basic metabolic panel     Status: Abnormal   Collection Time: 12/05/22  8:57 AM  Result Value Ref Range   Sodium 135 135 - 145 mmol/L   Potassium 4.2 3.5 - 5.1 mmol/L   Chloride 105 98 - 111 mmol/L   CO2 22 22 - 32 mmol/L   Glucose, Bld 92 70 - 99 mg/dL    Comment: Glucose reference range applies only to samples taken after  fasting for at least 8 hours.   BUN 18 8 - 23 mg/dL   Creatinine, Ser 6.21 (L) 0.61 - 1.24 mg/dL   Calcium 8.6 (L) 8.9 - 10.3 mg/dL   GFR, Estimated >30 >86 mL/min    Comment: (NOTE) Calculated using the CKD-EPI Creatinine Equation (2021)    Anion gap 8 5 - 15    Comment: Performed at [facility], [address], [place], [place] [address]  Magnesium     Status: Abnormal   Collection Time: 12/05/22  8:57 AM  Result Value Ref Range   Magnesium 1.5 (L) 1.7 - 2.4 mg/dL    Comment: Performed at [facility], [address], [place], [place] [address]  Basic metabolic panel     Status: Abnormal   Collection Time: 12/06/22  8:24 AM  Result Value Ref Range   Sodium 134 (L) 135 - 145 mmol/L   Potassium 3.9 3.5 - 5.1 mmol/L   Chloride 104 98 - 111 mmol/L   CO2 21 (L) 22 - 32 mmol/L   Glucose, Bld 133 (H) 70 - 99 mg/dL    Comment: Glucose reference range applies only to samples taken after fasting for at least 8 hours.   BUN 24 (H) 8 - 23 mg/dL   Creatinine, Ser 2.84 0.61 - 1.24 mg/dL   Calcium 8.7 (L) 8.9 - 10.3 mg/dL   GFR, Estimated >13 >24 mL/min    Comment: (NOTE) Calculated using the CKD-EPI Creatinine Equation (2021)    Anion gap 9 5 - 15    Comment: Performed at [facility], [address], [place], [place] [address]  Magnesium     Status: Abnormal   Collection Time: 12/06/22  8:24 AM  Result Value Ref Range   Magnesium 1.5 (L) 1.7 - 2.4 mg/dL    Comment: Performed at [facility], [address] [place], [place] [address]    Current  Facility-Administered Medications  Medication Dose Route Frequency Provider Last Rate Last Admin   acetaminophen (TYLENOL) tablet 650 mg  650 mg Oral Q6H PRN Bobette Mo, MD   650 mg at 12/06/22 1478   Or   acetaminophen (TYLENOL) suppository 650 mg  650 mg Rectal Q6H PRN Bobette Mo, MD       acetaminophen (TYLENOL) tablet 500 mg  500 mg Oral TID  Regalado, Belkys A, MD   500 mg at 12/06/22 0913   feeding supplement (ENSURE ENLIVE / ENSURE PLUS) liquid 237 mL  237 mL Oral BID BM Regalado, Belkys A, MD   237 mL at 12/06/22 0913   ferrous sulfate tablet 325 mg  325 mg Oral Q breakfast Bobette Mo, MD   325 mg at 12/06/22 0912   folic acid (FOLVITE) tablet 1 mg  1 mg Oral Daily Bobette Mo, MD   1 mg at 12/06/22 0911   LORazepam (ATIVAN) injection 0.5 mg  0.5 mg Intravenous Q4H PRN Regalado, Belkys A, MD       magnesium oxide (MAG-OX) tablet 400 mg  400 mg Oral BID Bobette Mo, MD   400 mg at 12/06/22 2956   magnesium sulfate IVPB 2 g 50 mL  2 g Intravenous Once Regalado, Belkys A, MD       methocarbamol (ROBAXIN) tablet 500 mg  500 mg Oral TID Regalado, Belkys A, MD       metoprolol tartrate (LOPRESSOR) tablet 25 mg  25 mg Oral BID Roldan Ishikawa, MD   25 mg at 12/06/22 0912   mirtazapine (REMERON) tablet 7.5 mg  7.5 mg Oral QHS Salif Tay, Harrold Donath, MD       mometasone-formoterol [facility]) 200-5 MCG/ACT inhaler 2 puff  2 puff Inhalation BID Bobette Mo, MD   2 puff at 12/06/22 0820   multivitamin with minerals tablet 1 tablet  1 tablet Oral Daily Bobette Mo, MD   1 tablet at 12/06/22 0912   ondansetron (ZOFRAN) tablet 4 mg  4 mg Oral Q6H PRN Bobette Mo, MD       Or   ondansetron [facility]) injection 4 mg  4 mg Intravenous Q6H PRN Bobette Mo, MD       rivaroxaban Carlena Hurl) tablet 20 mg  20 mg Oral Q supper Bobette Mo, MD   20 mg at 12/05/22 1831   rosuvastatin (CRESTOR) tablet 10 mg  10 mg Oral Daily Bobette Mo, MD   10 mg at 12/06/22 0913   tamsulosin (FLOMAX) capsule 0.4 mg  0.4 mg Oral QPC supper Bobette Mo, MD   0.4 mg at 12/05/22 1831   thiamine (VITAMIN B1) 500 mg in sodium chloride 0.9 % 50 mL IVPB  500 mg Intravenous Daily Regalado, Belkys A, MD 100 mL/hr at 12/06/22 0949 500 mg at 12/06/22 2130    Musculoskeletal: Strength & Muscle Tone:  Laying in bed   Gait & Station: Laying in bed   Patient leans: Laying in bed              Psychiatric Specialty Exam:  Presentation  General Appearance:  Casual  Eye Contact: Good  Speech: Normal Rate  Speech Volume: Normal  Handedness: Right   Mood and Affect  Mood: Dysphoric  Affect: Full Range   Thought Process  Thought Processes: Linear  Descriptions of Associations:Intact  Orientation:Full (Time, Place and Person)  Thought Content:Logical  History of Schizophrenia/Schizoaffective disorder:No data recorded Duration of Psychotic Symptoms:No data  recorded Hallucinations:Hallucinations: None  Ideas of Reference:None  Suicidal Thoughts:Suicidal Thoughts: No  Homicidal Thoughts:Homicidal Thoughts: No   Sensorium  Memory: Immediate Fair; Recent Fair; Remote Fair  Judgment: Intact  Insight: Present   Executive Functions  Concentration: Fair  Attention Span: Poor  Recall: Good  Fund of Knowledge: Good  Language: Good   Psychomotor Activity  Psychomotor Activity: Psychomotor Activity: Normal   Assets  Assets: Communication Skills; Resilience   Sleep  Sleep: Sleep: Poor   Physical Exam: Physical Exam Vitals reviewed.  Constitutional:      General: He is not in acute distress.    Appearance: He is not toxic-appearing.  Pulmonary:     Effort: Pulmonary effort is normal.  Neurological:     Mental Status: He is alert.    Review of Systems  Constitutional:  Negative for chills and fever.  Psychiatric/Behavioral:  Positive for depression and substance abuse. Negative for hallucinations and suicidal ideas. The patient has insomnia. The patient is not nervous/anxious.    Blood pressure 125/89, pulse 93, temperature 98.2 F (36.8 C), temperature source Oral, resp. rate 18, height 5\' 9"  (1.753 m), weight 60.4 kg, SpO2 100 %. Body mass index is 19.66 kg/m.  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 Does not need sitter -start remeron 7.5 mg qhs. For mdd, sleep, appetite. Can incr to 15 mg qhs if needed -"living dreams" are likely 2/2 cognitive alterations in setting of alcohol intoxication, long h/o alcohol abuse -medical recommendations as per primary team  -pt would benefit from substance use treatment - social worker can offer available community options  -pt is seeking stronger pain meds for back pain so will hold on offering naltrexone    Disposition: No evidence of imminent risk to self or others at present.   Patient does not meet criteria for psychiatric inpatient admission. Supportive therapy provided about ongoing stressors. Discussed crisis plan, support from social network, calling 911, coming to the Emergency Department, and calling Suicide Hotline.  **psychiatry consult service will sign-off**   Cristy Hilts, MD 12/06/2022 2:45 PM  Total Time Spent in Direct Patient Care:  I personally spent 60 minutes on the unit in direct patient care. The direct patient care time included face-to-face time with the patient, reviewing the patient's chart, communicating with other professionals, and coordinating care. Greater than 50% of this time was spent in counseling or coordinating care with the patient regarding goals of hospitalization, psycho-education, and discharge planning needs.   Phineas Inches, MD Psychiatrist

## 2022-12-06 NOTE — Plan of Care (Signed)
  Problem: Clinical Measurements: Goal: Diagnostic test results will improve Outcome: Progressing Goal: Cardiovascular complication will be avoided Outcome: Progressing   Problem: Activity: Goal: Risk for activity intolerance will decrease Outcome: Progressing   Problem: Pain Managment: Goal: General experience of comfort will improve 12/06/2022 1636 by Dwyane Dee, RN Outcome: Progressing 12/06/2022 1635 by Dwyane Dee, RN Outcome: Progressing   Problem: Safety: Goal: Ability to remain free from injury will improve Outcome: Progressing

## 2022-12-06 NOTE — Progress Notes (Signed)
PROGRESS NOTE    Anthony [name]  ZOX:[id number] DOB: 08-14-1955 DOA: 12/02/2022 PCP: Pcp, No   Brief Narrative: 67 year old with past medical history significant for actinic keratoses, alcohol abuse, alcohol withdrawal, anxiety, depression, hiatal hernia, pulmonary embolism, surgically corrected hydrocephalus, hypertension, hypokalemia, lumbar compression fracture, pancytopenia, rhabdomyolysis, tobacco use disorder, who presented to the ED brought by [facility] due to altered mental status.  Patient has not been taking his medications.  He was stating that there was a religious group outside of his window and people trying to break into his home.  He drinks 6-12 beers daily but have not been drinking for the last 2 days.  Evaluation in the ED he was found to have A-fib with RVR.  He was a started on Cardizem drip and subsequently transitioned to oral Cardizem. MRI was positive for a small stroke.  Neurology has been consulted. Patient was also evaluated by psych who recommended medical evaluation for hallucination but likely could be related to alcoholic hallucinations.  No inpatient facility required.   Assessment & Plan:   Principal Problem:   Atrial fibrillation with RVR (HCC) Active Problems:   HTN (hypertension)   Paroxysmal atrial fibrillation (HCC)   Hypomagnesemia   Dyslipidemia   Macrocytic anemia   Alcohol abuse with alcohol-induced mood disorder (HCC)   Tobacco use disorder   Hyponatremia   Aortic atherosclerosis (HCC)   Abnormal LFTs   Stroke (cerebrum) (HCC)  1-Paroxysmal A-fib with RVR: -Continue with Xarelto -Initially on Cardizem drip and subsequently transition to oral Cardizem twice daily--Discontinue due to Hypotension.  -Continue with metoprolol , hopefully BP will tolerates it.  -Cardiology consulted, Hypotension limiting management of A fib. He has prior HO of orthostatic hypotension and was on midodrine in the past.  -Metoprolol Increase to 25 mg BID, he has been  tolerating it.   2-Hallucination: Could be in the setting of alcohol used. Acute metabolic encephalopathy -Evaluated by psych does not require inpatient hospitalization. -Recommended ammonia, EEG, MRI: Ammonia negative EEG evidence of encephalopathy.  MRI with a small acute stroke per neurology would not explain symptoms. -suspect related to alcohol use.  -High dose IV Thiamine.  Report livid dreams , asking to speak with psych.   3-Alcohol abuse with alcohol-induced mood disorder: Patient presented with probably alcoholic hallucination, chronic alcoholic encephalopathy Continue with CIWA--score 3 Continue with folic acid and thiamine  4-Abnormal liver function test in the setting of alcohol use: Follow trend Normalized.   5-Incidental acute small stroke: Acute punctate cortical/subcortical infarct in the left superior frontal gyrus -Okay to continue with Xarelto, discussed with neurology -Neurology has been consulted plan to proceed with CT angio head and neck A1c: 4.9, LDL 48 On xarelto. No need for ECHp, had recent echo.  CTA head and neck no large vessel occlusion.   Hypertension: Continue with metoprolol  Hyponatremia: in setting alcohol use. Improving.   Hypomagnesemia: Replete IV again today. On oral  supplement.   Dyslipidemia Aortic atherosclerosis (HCC) Continue rosuvastatin    Macrocytic anemia Monitor hematocrit and hemoglobin. Nutritional supplementation.   Tobacco use disorder Uses smokeless tobacco. Cessation advised.    Hypotension; BP drop to 70 5/16, improved with IV fluids. Cardizem discontinue. He has had previous history of hypotension requiring midodrine,. Might have to consider start midodrine.   Chronic Back pain; schedule tylenol. Keep PRN Robaxin   Estimated body mass index is 19.66 kg/m as calculated from the following:   Height as of this encounter: 5\' 9"  (1.753 m).   Weight  as of this encounter: 60.4 kg.   DVT prophylaxis: Xarelto Code  Status: Full code Family Communication: Care discussed with patient Disposition Plan:  Status is: Observation The patient remains OBS appropriate and will d/c before 2 midnights.    Consultants:  Psych Neurology   Procedures:    Antimicrobials:    Subjective: He report dreams, he doesn't know he is awake or not.   Objective: Vitals:   12/06/22 0505 12/06/22 0820 12/06/22 0912 12/06/22 1217  BP: (!) 116/99  (!) 134/95 125/89  Pulse: 90  98 93  Resp:    18  Temp:    98.2 F (36.8 C)  TempSrc:    Oral  SpO2: 99% 97%  100%  Weight:      Height:        Intake/Output Summary (Last 24 hours) at 12/06/2022 1431 Last data filed at 12/06/2022 1219 Gross per 24 hour  Intake 1250 ml  Output 525 ml  Net 725 ml    Filed Weights   12/02/22 0853  Weight: 60.4 kg    Examination:  General exam: NAD Respiratory system: CTA Cardiovascular system:  S 1, S 2 RRR Gastrointestinal system: BS present, soft, nt Central nervous system: Alert Extremities: No edema   Data Reviewed: I have personally reviewed following labs and imaging studies  CBC: Recent Labs  Lab 12/02/22 0409 12/03/22 0420 12/04/22 0406 12/05/22 0857  WBC 6.1 3.5* 4.7 4.6  NEUTROABS 3.3  --   --   --   HGB 11.8* 9.7* 9.1* 10.0*  HCT 36.6* 29.0* 28.6* 30.6*  MCV 100.5* 99.7 104.4* 103.4*  PLT 252 175 137* 155    Basic Metabolic Panel: Recent Labs  Lab 12/02/22 0409 12/03/22 0733 12/04/22 0406 12/05/22 0857 12/06/22 0824  NA 131* 133* 135 135 134*  K 4.4 3.3* 4.9 4.2 3.9  CL 99 104 105 105 104  CO2 13* 22 23 22  21*  GLUCOSE 95 90 99 92 133*  BUN 28* 20 21 18  24*  CREATININE 1.38* 0.69 0.87 0.60* 0.82  CALCIUM 9.0 8.3* 8.1* 8.6* 8.7*  MG 1.6*  --  1.4* 1.5* 1.5*  PHOS 5.1*  --   --   --   --     GFR: Estimated Creatinine Clearance: 74.7 mL/min (by C-G formula based on SCr of 0.82 mg/dL). Liver Function Tests: Recent Labs  Lab 12/02/22 0409 12/03/22 0733  AST 54* 29  ALT 21 17   ALKPHOS 77 63  BILITOT 1.5* 1.0  PROT 8.4* 6.8  ALBUMIN 4.1 3.4*    No results for input(s): "LIPASE", "AMYLASE" in the last 168 hours. Recent Labs  Lab 12/02/22 1027  AMMONIA 11    Coagulation Profile: No results for input(s): "INR", "PROTIME" in the last 168 hours. Cardiac Enzymes: No results for input(s): "CKTOTAL", "CKMB", "CKMBINDEX", "TROPONINI" in the last 168 hours. BNP (last 3 results) No results for input(s): "PROBNP" in the last 8760 hours. HbA1C: Recent Labs    12/04/22 0406  HGBA1C 4.9    CBG: No results for input(s): "GLUCAP" in the last 168 hours. Lipid Profile: Recent Labs    12/04/22 0406  CHOL 121  HDL 60  LDLCALC 48  TRIG 63  CHOLHDL 2.0    Thyroid Function Tests: No results for input(s): "TSH", "T4TOTAL", "FREET4", "T3FREE", "THYROIDAB" in the last 72 hours.  Anemia Panel: No results for input(s): "VITAMINB12", "FOLATE", "FERRITIN", "TIBC", "IRON", "RETICCTPCT" in the last 72 hours.  Sepsis Labs: Recent Labs  Lab 12/02/22 0450  12/02/22 0858  LATICACIDVEN 3.5* 1.2     No results found for this or any previous visit (from the past 240 hour(s)).       Radiology Studies: No results found.      Scheduled Meds:  acetaminophen  500 mg Oral TID   feeding supplement  237 mL Oral BID BM   ferrous sulfate  325 mg Oral Q breakfast   folic acid  1 mg Oral Daily   magnesium oxide  400 mg Oral BID   methocarbamol  500 mg Oral TID   metoprolol tartrate  25 mg Oral BID   mometasone-formoterol  2 puff Inhalation BID   multivitamin with minerals  1 tablet Oral Daily   rivaroxaban  20 mg Oral Q supper   rosuvastatin  10 mg Oral Daily   tamsulosin  0.4 mg Oral QPC supper   Continuous Infusions:  magnesium sulfate bolus IVPB     thiamine (VITAMIN B1) injection 500 mg (12/06/22 0949)     LOS: 1 day    Time spent: 35 minutes    Jamonica Schoff A Shalita Notte, MD [facility]   If 7PM-7AM, please contact  night-coverage [email or web address]  12/06/2022, 2:31 PM

## 2022-12-07 DIAGNOSIS — I4891 Unspecified atrial fibrillation: Secondary | ICD-10-CM | POA: Diagnosis not present

## 2022-12-07 LAB — BASIC METABOLIC PANEL
Anion gap: 8 (ref 5–15)
BUN: 21 mg/dL (ref 8–23)
CO2: 22 mmol/L (ref 22–32)
Calcium: 8.9 mg/dL (ref 8.9–10.3)
Chloride: 107 mmol/L (ref 98–111)
Creatinine, Ser: 0.74 mg/dL (ref 0.61–1.24)
GFR, Estimated: >60 mL/min (ref >60)
Glucose, Bld: 119 mg/dL — ABNORMAL HIGH (ref 70–99)
Potassium: 3.8 mmol/L (ref 3.5–5.1)
Sodium: 137 mmol/L (ref 135–145)

## 2022-12-07 LAB — MAGNESIUM: Magnesium: 1.8 mg/dL (ref 1.7–2.4)

## 2022-12-07 MED ORDER — ACETAMINOPHEN 500 MG PO TABS
1000.0000 mg | ORAL_TABLET | Freq: Three times a day (TID) | ORAL | Status: DC
  Administered 2022-12-07 – 2022-12-18 (×33): 1000 mg via ORAL
  Filled 2022-12-07 (×34): qty 2

## 2022-12-07 MED ORDER — ACETAMINOPHEN 500 MG PO TABS: 500.0000 mg | ORAL_TABLET | Freq: Once | ORAL | Status: DC

## 2022-12-07 MED ORDER — SODIUM CHLORIDE 0.9 % IV SOLN: INTRAVENOUS | Status: AC

## 2022-12-07 NOTE — Progress Notes (Signed)
Speech Language Pathology Treatment: Cognitive-Linquistic  Patient Details Name: Anthony [name] MRN: [id number] DOB: March 21, 1956 Today's Date: 12/07/2022 Time: 0865-7846 SLP Time Calculation (min) (ACUTE ONLY): 20 min  Assessment / Plan / Recommendation Clinical Impression  Patient seen by SLP for skilled treatment session focused on cognitive-linguistic treatment. He was in bed, awake and alert and receptive to talking with SLP. Patient able to fully communicate with SLP. He was calm during the session but when SLP outside of room documenting on session, he was overheard talking with Mobility Specialist and seemed to be getting agitated with vocal intensity increasing and patient complaining about food tray that had been removed from his room, "that was my tray! It had a lot of food on there!" Of note, SLP had witnessed NT inspect patient's meal tray prior to removing it from room and majority of food items had been consumed. During session with SLP, patient verbose, talking of the many things that he needs to get in order before he would be able to leave hospital. He told SLP that he had been living in a house (he describes as a "drug house") for approximately one month but was owing money "into the second month". Prior to that, patient [place]. He also told SLP that he had four phones "two of them were brand new" had been gone missing here in the hospital and patient suspects "it was an inside job." After session, SLP reviewed notes from prior hospitalizations and similar behaviors documented. SLP not highly suspicious of patient having significant change in his cognition from his baseline deficits and will s/o at this time.   HPI HPI: 67 year old with past medical history significant for actinic keratoses, alcohol abuse, alcohol withdrawal, anxiety, depression, hiatal hernia, pulmonary embolism, surgically corrected hydrocephalus, hypertension, hypokalemia, lumbar compression  fracture, pancytopenia, rhabdomyolysis, tobacco use disorder, who presented to the ED brought by [facility] due to altered mental status.  Patient has not been taking his medications. MRI showed a small CVA, Acute punctate cortical/subcortical infarct in the left superior frontal gyrus. Positive for hallunications which per MD could be in the setting of alcohol vs acute metabolic encephalopathy.      SLP Plan  All goals met      Recommendations for follow up therapy are one component of a multi-disciplinary discharge planning process, led by the attending physician.  Recommendations may be updated based on patient status, additional functional criteria and insurance authorization.    Recommendations      No SLP f/u                   PRN Cognitive communication deficit (N62.952)     All goals met    Angela Nevin, MA, CCC-SLP Speech Therapy

## 2022-12-07 NOTE — TOC Progression Note (Addendum)
Transition of Care [facility]) - Progression Note    Patient Details  Name: Anthony [name] MRN: [id number] Date of Birth: 1956/05/15  Transition of Care [facility]) CM/SW Contact  Howell Rucks, RN Phone Number: 12/07/2022, 1:18 PM  Clinical Narrative:  Met with pt in room, notified by nurse pt requesting resources. Pt requesting resources for shelters, pt provided list of local shelters, NCM offered to assist pt in calling shelters on the list, pt declined. Pt previously given member services contact number for [facility] to call to select a PCP, NCM offered to assist pt in calling insurance, pt declined, states he will call himself.  Pt given option of extended stay hotel. Will continue to follow.      Expected Discharge Plan: Homeless Shelter Barriers to Discharge: Continued Medical Work up  Expected Discharge Plan and Services   Discharge Planning Services: CM Consult   Living arrangements for the past 2 months: Group Home                                       Social Determinants of Health (SDOH) Interventions SDOH Screenings   Food Insecurity: Food Insecurity Present (12/02/2022)  Housing: High Risk (12/02/2022)  Transportation Needs: Unmet Transportation Needs (12/02/2022)  Utilities: At Risk (12/02/2022)  Depression (PHQ2-9): Medium Risk (10/16/2020)  Tobacco Use: High Risk (12/02/2022)    Readmission Risk Interventions    10/12/2022    1:29 PM 08/28/2022    2:00 PM 08/27/2022   10:30 AM  Readmission Risk Prevention Plan  Transportation Screening Complete Complete Complete  Medication Review Oceanographer) Complete  Complete  PCP or Specialist appointment within 3-5 days of discharge Complete Complete Complete  HRI or Home Care Consult Complete  Complete  SW Recovery Care/Counseling Consult Complete Complete Complete  Palliative Care Screening Not Applicable Not Applicable Not Applicable  Skilled Nursing Facility Not Applicable Not Applicable Not Applicable

## 2022-12-07 NOTE — Progress Notes (Signed)
Mobility Specialist - Progress Note   12/07/22 1049  Mobility  Activity Ambulated independently in hallway  Level of Assistance Independent  Assistive Device None  Distance Ambulated (ft) 350 ft  Activity Response Tolerated well  Mobility Referral Yes  $Mobility charge 1 Mobility  Mobility Specialist Stop Time (ACUTE ONLY) 1048   Pt received in bed and agreeable to mobility. No complaints during session. Pt to bed after session with all needs met.   [facility]

## 2022-12-07 NOTE — Progress Notes (Signed)
PROGRESS NOTE    Anthony [name]  UJW:[id number] DOB: 03-May-1956 DOA: 12/02/2022 PCP: Pcp, No   Brief Narrative: 67 year old with past medical history significant for actinic keratoses, alcohol abuse, alcohol withdrawal, anxiety, depression, hiatal hernia, pulmonary embolism, surgically corrected hydrocephalus, hypertension, hypokalemia, lumbar compression fracture, pancytopenia, rhabdomyolysis, tobacco use disorder, who presented to the ED brought by [facility] due to altered mental status.  Patient has not been taking his medications.  He was stating that there was a religious group outside of his window and people trying to break into his home.  He drinks 6-12 beers daily but have not been drinking for the last 2 days.  Evaluation in the ED he was found to have A-fib with RVR.  He was a started on Cardizem drip and subsequently transitioned to oral Cardizem. MRI was positive for a small stroke.  Neurology has been consulted. Patient was also evaluated by psych who recommended medical evaluation for hallucination but likely could be related to alcoholic hallucinations.  No inpatient facility required.   Assessment & Plan:   Principal Problem:   Atrial fibrillation with RVR (HCC) Active Problems:   HTN (hypertension)   Paroxysmal atrial fibrillation (HCC)   Hypomagnesemia   Dyslipidemia   Macrocytic anemia   Alcohol abuse with alcohol-induced mood disorder (HCC)   Tobacco use disorder   Hyponatremia   Aortic atherosclerosis (HCC)   Abnormal LFTs   Stroke (cerebrum) (HCC)  1-Paroxysmal A-fib with RVR: -Continue with Xarelto -Initially on Cardizem drip and subsequently transition to oral Cardizem twice daily--Discontinue due to Hypotension.  -Continue with metoprolol  -Cardiology consulted, Hypotension limiting management of A fib. He has prior HO of orthostatic hypotension and was on midodrine in the past.  -Metoprolol Increase to 25 mg BID, he has been tolerating it.  Orthostatic vitals  positive, cardiology has order IV fluids.   2-Hallucination: Could be in the setting of alcohol used. Acute metabolic encephalopathy -Evaluated by psych does not require inpatient hospitalization. -Recommended ammonia, EEG, MRI: Ammonia negative EEG evidence of encephalopathy.  MRI with a small acute stroke per neurology would not explain symptoms. -Suspect related to alcohol use.  -High dose IV Thiamine.  Evaluated by psych, dreams related to cognitive alteration in setting of alcohol use.  Started on Remeron to assist with sleep, mood, appetite.   3-Alcohol abuse with alcohol-induced mood disorder: Patient presented with probably alcoholic hallucination, chronic alcoholic encephalopathy Continue with CIWA--score 3 Continue with folic acid and thiamine.  4-Abnormal liver function test in the setting of alcohol use: Follow trend.  Normalized.   5-Incidental acute small stroke: Acute punctate cortical/subcortical infarct in the left superior frontal gyrus -Okay to continue with Xarelto, discussed with neurology -Neurology has been consulted plan to proceed with CT angio head and neck -A1c: 4.9, LDL 48 -On xarelto. No need for ECHp, had recent echo.  -CTA head and neck no large vessel occlusion.   Hypertension: Continue with metoprolol  Hyponatremia: in setting alcohol use. Improving.   Hypomagnesemia: Replete IV again today. On oral  supplement.   Dyslipidemia Aortic atherosclerosis (HCC) Continue rosuvastatin    Macrocytic anemia Monitor hematocrit and hemoglobin. Nutritional supplementation.   Tobacco use disorder Uses smokeless tobacco. Cessation advised.    Hypotension; BP drop to 70 5/16, improved with IV fluids. Cardizem discontinue. He has had previous history of hypotension requiring midodrine,. Might have to consider start midodrine.   Chronic Back pain; schedule tylenol. Keep PRN Robaxin   Estimated body mass index is 19.66 kg/m as  calculated from the  following:   Height as of this encounter: 5\' 9"  (1.753 m).   Weight as of this encounter: 60.4 kg.   DVT prophylaxis: Xarelto Code Status: Full code Family Communication: Care discussed with patient Disposition Plan:  Status is: Observation The patient remains OBS appropriate and will d/c before 2 midnights.    Consultants:  Psych Neurology   Procedures:    Antimicrobials:    Subjective: -he is complaining of back pain, he takes higher dose of tylenol at home. Will increase tylenol.  He has many concern about his breakfast tray, was taking away without him finishing. Also he lost his reading glasses in the hospital.   Objective: Vitals:   12/07/22 0903 12/07/22 0905 12/07/22 0907 12/07/22 0911  BP: 119/83 124/89 91/64 (!) 88/67  Pulse: 99 (!) 113 (!) 135 (!) 123  Resp: 16     Temp: 98.3 F (36.8 C)     TempSrc: Oral     SpO2: 100% 100% 100% (!) 88%  Weight:      Height:        Intake/Output Summary (Last 24 hours) at 12/07/2022 1410 Last data filed at 12/07/2022 0900 Gross per 24 hour  Intake 650 ml  Output 2150 ml  Net -1500 ml    Filed Weights   12/02/22 0853  Weight: 60.4 kg    Examination:  General exam: NAD Respiratory system: CTA Cardiovascular system:  S 1, S 2 RRR Gastrointestinal system: BS present, soft, nt Central nervous system: alert Extremities: no edema   Data Reviewed: I have personally reviewed following labs and imaging studies  CBC: Recent Labs  Lab 12/02/22 0409 12/03/22 0420 12/04/22 0406 12/05/22 0857  WBC 6.1 3.5* 4.7 4.6  NEUTROABS 3.3  --   --   --   HGB 11.8* 9.7* 9.1* 10.0*  HCT 36.6* 29.0* 28.6* 30.6*  MCV 100.5* 99.7 104.4* 103.4*  PLT 252 175 137* 155    Basic Metabolic Panel: Recent Labs  Lab 12/02/22 0409 12/03/22 0733 12/04/22 0406 12/05/22 0857 12/06/22 0824 12/07/22 0852  NA 131* 133* 135 135 134* 137  K 4.4 3.3* 4.9 4.2 3.9 3.8  CL 99 104 105 105 104 107  CO2 13* 22 23 22  21* 22  GLUCOSE  95 90 99 92 133* 119*  BUN 28* 20 21 18  24* 21  CREATININE 1.38* 0.69 0.87 0.60* 0.82 0.74  CALCIUM 9.0 8.3* 8.1* 8.6* 8.7* 8.9  MG 1.6*  --  1.4* 1.5* 1.5* 1.8  PHOS 5.1*  --   --   --   --   --     GFR: Estimated Creatinine Clearance: 76.5 mL/min (by C-G formula based on SCr of 0.74 mg/dL). Liver Function Tests: Recent Labs  Lab 12/02/22 0409 12/03/22 0733  AST 54* 29  ALT 21 17  ALKPHOS 77 63  BILITOT 1.5* 1.0  PROT 8.4* 6.8  ALBUMIN 4.1 3.4*    No results for input(s): "LIPASE", "AMYLASE" in the last 168 hours. Recent Labs  Lab 12/02/22 1027  AMMONIA 11    Coagulation Profile: No results for input(s): "INR", "PROTIME" in the last 168 hours. Cardiac Enzymes: No results for input(s): "CKTOTAL", "CKMB", "CKMBINDEX", "TROPONINI" in the last 168 hours. BNP (last 3 results) No results for input(s): "PROBNP" in the last 8760 hours. HbA1C: No results for input(s): "HGBA1C" in the last 72 hours.  CBG: No results for input(s): "GLUCAP" in the last 168 hours. Lipid Profile: No results for input(s): "CHOL", "HDL", "  LDLCALC", "TRIG", "CHOLHDL", "LDLDIRECT" in the last 72 hours.  Thyroid Function Tests: No results for input(s): "TSH", "T4TOTAL", "FREET4", "T3FREE", "THYROIDAB" in the last 72 hours.  Anemia Panel: No results for input(s): "VITAMINB12", "FOLATE", "FERRITIN", "TIBC", "IRON", "RETICCTPCT" in the last 72 hours.  Sepsis Labs: Recent Labs  Lab 12/02/22 0450 12/02/22 0858  LATICACIDVEN 3.5* 1.2     No results found for this or any previous visit (from the past 240 hour(s)).       Radiology Studies: No results found.      Scheduled Meds:  acetaminophen  1,000 mg Oral TID   feeding supplement  237 mL Oral BID BM   ferrous sulfate  325 mg Oral Q breakfast   folic acid  1 mg Oral Daily   magnesium oxide  400 mg Oral BID   methocarbamol  500 mg Oral TID   metoprolol tartrate  25 mg Oral BID   mirtazapine  7.5 mg Oral QHS    mometasone-formoterol  2 puff Inhalation BID   multivitamin with minerals  1 tablet Oral Daily   rivaroxaban  20 mg Oral Q supper   rosuvastatin  10 mg Oral Daily   tamsulosin  0.4 mg Oral QPC supper   Continuous Infusions:  sodium chloride 100 mL/hr at 12/07/22 1017   thiamine (VITAMIN B1) injection 500 mg (12/07/22 0921)     LOS: 2 days    Time spent: 35 minutes    Kylea Berrong A Aydenn Gervin, MD [facility]   If 7PM-7AM, please contact night-coverage [email or web address]  12/07/2022, 2:10 PM

## 2022-12-07 NOTE — Progress Notes (Addendum)
Progress Note  Patient Name: Anthony [name] Date of Encounter: 12/07/2022  Primary Cardiologist: Verne Carrow, MD  Subjective   Feeling well this AM, offers no acute complaints aside from chronic back pain which he states is unchanged for many years. No CP. Denies any SOB but states "though I'm just laying here not doing anything." A+Ox3, normal affect today.  Inpatient Medications    Scheduled Meds:  acetaminophen  1,000 mg Oral TID   feeding supplement  237 mL Oral BID BM   ferrous sulfate  325 mg Oral Q breakfast   folic acid  1 mg Oral Daily   magnesium oxide  400 mg Oral BID   methocarbamol  500 mg Oral TID   metoprolol tartrate  25 mg Oral BID   mirtazapine  7.5 mg Oral QHS   mometasone-formoterol  2 puff Inhalation BID   multivitamin with minerals  1 tablet Oral Daily   rivaroxaban  20 mg Oral Q supper   rosuvastatin  10 mg Oral Daily   tamsulosin  0.4 mg Oral QPC supper   Continuous Infusions:  sodium chloride 100 mL/hr at 12/07/22 1017   thiamine (VITAMIN B1) injection 500 mg (12/07/22 0921)   PRN Meds: acetaminophen **OR** acetaminophen, LORazepam, ondansetron **OR** ondansetron (ZOFRAN) IV   Vital Signs    Vitals:   12/07/22 0903 12/07/22 0905 12/07/22 0907 12/07/22 0911  BP: 119/83 124/89 91/64 (!) 88/67  Pulse: 99 (!) 113 (!) 135 (!) 123  Resp: 16     Temp: 98.3 F (36.8 C)     TempSrc: Oral     SpO2: 100% 100% 100% (!) 88%  Weight:      Height:        Intake/Output Summary (Last 24 hours) at 12/07/2022 1335 Last data filed at 12/07/2022 0900 Gross per 24 hour  Intake 650 ml  Output 2150 ml  Net -1500 ml      12/02/2022    8:53 AM 10/10/2022    9:00 AM 10/10/2022    2:00 AM  Last 3 Weights  Weight (lbs) 133 lb 2.5 oz 147 lb 0.8 oz 149 lb 14.6 oz  Weight (kg) 60.4 kg 66.7 kg 68 kg     Telemetry    AF HR presently around low 100s (range 80s-120s) - Personally Reviewed  Physical Exam   GEN: Dissheveled appearing WM in NAD HEENT:  Normocephalic, atraumatic, sclera non-icteric. Neck: No JVD or bruits. Cardiac: Irregularly irregular, mildly tachycardic, no murmurs, rubs, or gallops.  Respiratory: Clear to auscultation bilaterally. Breathing is unlabored. GI: Soft, nontender, non-distended, BS +x 4. MS: no deformity. Extremities: No clubbing or cyanosis. No edema. Distal pedal pulses are 2+ and equal bilaterally. Neuro:  AAOx3. Follows commands. Psych:  Responds to questions appropriately with a normal affect.  Labs    High Sensitivity Troponin:   Recent Labs  Lab 12/02/22 0409 12/02/22 0858  TROPONINIHS 11 10      Cardiac EnzymesNo results for input(s): "TROPONINI" in the last 168 hours. No results for input(s): "TROPIPOC" in the last 168 hours.   Chemistry Recent Labs  Lab 12/02/22 0409 12/03/22 0733 12/04/22 0406 12/05/22 0857 12/06/22 0824 12/07/22 0852  NA 131* 133*   < > 135 134* 137  K 4.4 3.3*   < > 4.2 3.9 3.8  CL 99 104   < > 105 104 107  CO2 13* 22   < > 22 21* 22  GLUCOSE 95 90   < > 92 133* 119*  BUN 28* 20   < > 18 24* 21  CREATININE 1.38* 0.69   < > 0.60* 0.82 0.74  CALCIUM 9.0 8.3*   < > 8.6* 8.7* 8.9  PROT 8.4* 6.8  --   --   --   --   ALBUMIN 4.1 3.4*  --   --   --   --   AST 54* 29  --   --   --   --   ALT 21 17  --   --   --   --   ALKPHOS 77 63  --   --   --   --   BILITOT 1.5* 1.0  --   --   --   --   GFRNONAA 56* >60   < > >60 >60 >60  ANIONGAP 19* 7   < > 8 9 8    < > = values in this interval not displayed.     Hematology Recent Labs  Lab 12/03/22 0420 12/04/22 0406 12/05/22 0857  WBC 3.5* 4.7 4.6  RBC 2.91* 2.74* 2.96*  HGB 9.7* 9.1* 10.0*  HCT 29.0* 28.6* 30.6*  MCV 99.7 104.4* 103.4*  MCH 33.3 33.2 33.8  MCHC 33.4 31.8 32.7  RDW 20.2* 20.8* 20.8*  PLT 175 137* 155    BNP Recent Labs  Lab 12/02/22 0409  BNP 341.6*     DDimer No results for input(s): "DDIMER" in the last 168 hours.   Radiology    No results found.  Cardiac Studies   Last  echo 08/2022 Sonographer Comments: Technically difficult study due to poor echo  windows, suboptimal parasternal window, suboptimal apical window and  suboptimal subcostal window. Image acquisition challenging due to  uncooperative patient and Image acquisition  challenging due to patient behavioral factors. Patient in restraints  attempting to get up during study. Patient supine and could not turn.   1. Left ventricular ejection fraction, by estimation, is 60 to 65%. The  left ventricle has normal function. The left ventricle has no regional  wall motion abnormalities. Left ventricular diastolic parameters are  indeterminate.   2. Right ventricular systolic function is normal. The right ventricular  size is mildly enlarged. Tricuspid regurgitation signal is inadequate for  assessing PA pressure.   3. The mitral valve is normal in structure. No evidence of mitral valve  regurgitation. No evidence of mitral stenosis.   4. The aortic valve was not well visualized. Aortic valve regurgitation  is not visualized. No aortic stenosis is present.    Patient Profile     67 y.o. 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 Has had numerous prior admissions/ER visits for various medical issues including sepsis, aspiration PNA, traumatic pneumothorax, pressure ulcer, C Diff, delirium. Patient presented this admission via [facility] for hallucinations. Found to have recurrent AF RVR, incidental small stroke, elevated LFTs, hyponatremia, hypomagnesemia, macrocytic anemia, hypotension.   Assessment & Plan    1. Paroxysmal atrial fibrillation - has been ongoing issue during frequent  readmissions, reported to have last converted to NSR 08/2022 on diltiazem, not adherent with OP f/u, med compliance concerns - rate control strategy recommended given acute issues with ETOH, delirium, concern for noncompliance - TSH wnl - HR predominantly 80s-90s with periodic spikes to the 120s - Xarelto  resumed in setting of persistent AF, stroke - needs VERY CLOSE outpatient f/u including primary care to help manage comorbid conditions, psychiatric concerns - previously on IV diltiazem but stopped due to hypotension per notes - currenty on metoprolol 25mg  BID - check orthostatic VS this AM and if acceptable consider advancing dose - addendum: orthostatics remain positive - standing: 119/83, sitting: 124/89, standing 0 mins: 91/64, standing 3 mins: 88/67. HR increased to 140s standing but back down to 109 now at rest. With BUN being increased, will order IVF @ 100/hr x 8 hours, reassess in AM, hold off BB titration; continue metoprolol 25mg  BID. If orthostasis persists can consider midodrine   2. Stroke - CTA head/neck without large vessel occlusion - per neuro/IM, repeat echo not needed given anticoagulated with Xarelto and recent echo 08/2022 (patient was not well cooperative with the study but EF was normal)  3. Prior h/o acute diastolic HF in setting of Afib - volume status looks OK  4. Moderate CAD - by cor CT 2022 - no ASA given ETOH/fall risk, need for DOAC - on beta blocker - on statin  5. Hallucinations in setting of alcohol use disorder, homelessness - UDS + barbiturates, neg cocaine - per primary team - increased risk of rehospitalization given social issues  6. Hypomagnesemia, hypokalemia - in setting of ETOH use - further lyte management per primary team - recommend keeping K 4.0 or greater, Mg 2.0 or greater   For questions or updates, please contact [facility] Please consult [email or web address] for contact info under Cardiology/STEMI.  Signed, Thomasene Ripple,  DO 12/07/2022, 1:35 PM     Patient seen and examined, note reviewed with the signed Advanced Practice Provider. I personally reviewed laboratory data, imaging studies and relevant notes. I independently examined the patient and formulated the important aspects of the plan. I have personally discussed the plan with the patient and/or family. Comments or changes to the note/plan are indicated below.  He was not in a very good mood at the time of my visit - due to his meal tray being taken away prior to finishing his breakfast.   Paroxysmal atrial fibrillation CVA History of chronic diastolic heart failure-clinically appears to be euvolemic Moderate CAD Chronic alcohol use disorder with hallucination Homelessness-May need to have social work evaluation Electrolyte abnormalities  He has had history of previous episodes of sinus rhythm currently persistent A-fib.  Metoprolol tartrate 25 mg twice daily is being given for rate control.  Unfortunately we have to watch him closely with this as his orthostatic vital this morning were positive.  Agree with the gentle IV fluid and reassessing. Likely may need midodrine.  In the setting of his CVA Xarelto has been started for the stroke prevention. He has no anginal symptoms.  Not on aspirin due to currently being on WelChol. Continue beta-blocker as well as statin. He will benefit from evaluation for care navigation/social worker. Keep mag above 2 and K above 4.   Thomasene Ripple DO, MS [facility] Attending Cardiologist [facility]  [address] [place], [place] [address] [phone] Website: [email or web address]

## 2022-12-07 NOTE — TOC Progression Note (Signed)
Transition of Care [facility]) - Progression Note    Patient Details  Name: Anthony [name] MRN: [id number] Date of Birth: Nov 24, 1955  [facility]) CM/SW Contact  Howell Rucks, RN Phone Number: 12/07/2022, 10:37 AM  Clinical Narrative:   Pt seen by PT, no PT follow up. Will continue to follow     Expected Discharge Plan: Homeless Shelter Barriers to Discharge: Continued Medical Work up  Expected Discharge Plan and Services   Discharge Planning Services: CM Consult   Living arrangements for the past 2 months: Group Home                                       Social Determinants of Health (SDOH) Interventions SDOH Screenings   Food Insecurity: Food Insecurity Present (12/02/2022)  Housing: High Risk (12/02/2022)  Transportation Needs: Unmet Transportation Needs (12/02/2022)  Utilities: At Risk (12/02/2022)  Depression (PHQ2-9): Medium Risk (10/16/2020)  Tobacco Use: High Risk (12/02/2022)    Readmission Risk Interventions    10/12/2022    1:29 PM 08/28/2022    2:00 PM 08/27/2022   10:30 AM  Readmission Risk Prevention Plan  Transportation Screening Complete Complete Complete  Medication Review [facility]) Complete  Complete  PCP or Specialist appointment within 3-5 days of discharge Complete Complete Complete  HRI or Home Care Consult Complete  Complete  SW Recovery Care/Counseling Consult Complete Complete Complete  Palliative Care Screening Not Applicable Not Applicable Not Applicable  Skilled Nursing Facility Not Applicable Not Applicable Not Applicable

## 2022-12-08 DIAGNOSIS — I4891 Unspecified atrial fibrillation: Secondary | ICD-10-CM | POA: Diagnosis not present

## 2022-12-08 LAB — BASIC METABOLIC PANEL
Anion gap: 6 (ref 5–15)
BUN: 23 mg/dL (ref 8–23)
CO2: 22 mmol/L (ref 22–32)
Calcium: 9.1 mg/dL (ref 8.9–10.3)
Chloride: 108 mmol/L (ref 98–111)
Creatinine, Ser: 0.83 mg/dL (ref 0.61–1.24)
GFR, Estimated: >60 mL/min (ref >60)
Glucose, Bld: 92 mg/dL (ref 70–99)
Potassium: 4.3 mmol/L (ref 3.5–5.1)
Sodium: 136 mmol/L (ref 135–145)

## 2022-12-08 LAB — CBC
HCT: 29.9 % — ABNORMAL LOW (ref 39.0–52.0)
Hemoglobin: 9.7 g/dL — ABNORMAL LOW (ref 13.0–17.0)
MCH: 34.2 pg — ABNORMAL HIGH (ref 26.0–34.0)
MCHC: 32.4 g/dL (ref 30.0–36.0)
MCV: 105.3 fL — ABNORMAL HIGH (ref 80.0–100.0)
Platelets: 137 10*3/uL — ABNORMAL LOW (ref 150–400)
RBC: 2.84 MIL/uL — ABNORMAL LOW (ref 4.22–5.81)
RDW: 20.5 % — ABNORMAL HIGH (ref 11.5–15.5)
WBC: 4.7 10*3/uL (ref 4.0–10.5)
nRBC: 0 % (ref 0.0–0.2)

## 2022-12-08 LAB — VITAMIN B1: Vitamin B1 (Thiamine): 88.7 nmol/L (ref 66.5–200.0)

## 2022-12-08 LAB — MAGNESIUM: Magnesium: 1.9 mg/dL (ref 1.7–2.4)

## 2022-12-08 MED ORDER — DILTIAZEM HCL 30 MG PO TABS
30.0000 mg | ORAL_TABLET | Freq: Four times a day (QID) | ORAL | Status: DC
  Administered 2022-12-08 – 2022-12-10 (×8): 30 mg via ORAL
  Filled 2022-12-08 (×8): qty 1

## 2022-12-08 NOTE — TOC Progression Note (Addendum)
Transition of Care [facility]) - Progression Note    Patient Details  Name: Anthony [name] MRN: [id number] Date of Birth: [birth year]  Transition [facility] Care [facility]) CM/SW Contact  Howell Rucks, RN Phone Number: 12/08/2022, 12:39 PM  Clinical Narrative:  Met with pt at bedside per nurse request for resources. NCM has provided pt with shelter resources, met with pt yesterday, declined assistance in calling shelters. Patient reports he plans to dc to [facility]. PT recommendation for [employer], [employer] rep-Jermaine to deliver to pt's room. Safe transportation waiver( signed by pt) and face sheet placed in shadow chart.   Expected Discharge Plan: Homeless Shelter Barriers to Discharge: Continued Medical Work up  Expected Discharge Plan and Services   Discharge Planning Services: CM Consult   Living arrangements for the past 2 months: Group Home                                       Social Determinants of Health (SDOH) Interventions SDOH Screenings   Food Insecurity: Food Insecurity Present (12/02/2022)  Housing: High Risk (12/02/2022)  Transportation Needs: Unmet Transportation Needs (12/02/2022)  Utilities: At Risk (12/02/2022)  Depression (PHQ2-9): Medium Risk (10/16/2020)  Tobacco Use: High Risk (12/02/2022)    Readmission Risk Interventions    10/12/2022    1:29 PM 08/28/2022    2:00 PM 08/27/2022   10:30 AM  Readmission Risk Prevention Plan  Transportation Screening Complete Complete Complete  Medication Review Oceanographer) Complete  Complete  PCP or Specialist appointment within 3-5 days of discharge Complete Complete Complete  HRI or Home Care Consult Complete  Complete  SW Recovery Care/Counseling Consult Complete Complete Complete  Palliative Care Screening Not Applicable Not Applicable Not Applicable  Skilled Nursing Facility Not Applicable Not Applicable Not Applicable

## 2022-12-08 NOTE — Progress Notes (Signed)
PROGRESS NOTE    Anthony [name]  ZOX:[id number] DOB: 1955/11/18 DOA: 12/02/2022 PCP: Pcp, No   Brief Narrative: 67 year old with past medical history significant for actinic keratoses, alcohol abuse, alcohol withdrawal, anxiety, depression, hiatal hernia, pulmonary embolism, surgically corrected hydrocephalus, hypertension, hypokalemia, lumbar compression fracture, pancytopenia, rhabdomyolysis, tobacco use disorder, who presented to the ED brought by [facility] due to altered mental status.  Patient has not been taking his medications.  He was stating that there was a religious group outside of his window and people trying to break into his home.  He drinks 6-12 beers daily but have not been drinking for the last 2 days.  Evaluation in the ED he was found to have A-fib with RVR.  He was a started on Cardizem drip and subsequently transitioned to oral Cardizem. MRI was positive for a small stroke.  Neurology has been consulted. Patient was also evaluated by psych who recommended medical evaluation for hallucination but likely could be related to alcoholic hallucinations.  No inpatient facility required.  Currently cardiology adjusting medications for A fib. In setting of Orthostatic Hypotension.   Assessment & Plan:   Principal Problem:   Atrial fibrillation with RVR (HCC) Active Problems:   HTN (hypertension)   Paroxysmal atrial fibrillation (HCC)   Hypomagnesemia   Dyslipidemia   Macrocytic anemia   Alcohol abuse with alcohol-induced mood disorder (HCC)   Tobacco use disorder   Hyponatremia   Aortic atherosclerosis (HCC)   Abnormal LFTs   Stroke (cerebrum) (HCC)  1-Paroxysmal A-fib with RVR: -Continue with Xarelto -Initially on Cardizem drip and subsequently transition to oral Cardizem twice daily--Discontinue due to Hypotension.   -Cardiology consulted, Hypotension limiting management of A fib. He has prior HO of orthostatic hypotension and was on midodrine in the past.  -Started on   metoprolol ---but now HR elevated. Metoprolol stopped 5/21. -Orthostatic vitals positive, received IV fluids.  -medications change again to Cardizem, will need to monitor BP>   2-Hallucination: Could be in the setting of alcohol used. Acute metabolic encephalopathy -Evaluated by psych does not require inpatient hospitalization. -Recommended ammonia, EEG, MRI: Ammonia negative EEG evidence of encephalopathy.  MRI with a small acute stroke per neurology would not explain symptoms. -Suspect related to alcohol use.  -High dose IV Thiamine.  Evaluated by psych, dreams related to cognitive alteration in setting of alcohol use.  Started on Remeron to assist with sleep, mood, appetite.   3-Alcohol abuse with alcohol-induced mood disorder: Patient presented with probably alcoholic hallucination, chronic alcoholic encephalopathy Continue with CIWA--Completed.  Continue with folic acid and thiamine.  4-Abnormal liver function test in the setting of alcohol use: Follow trend.  Normalized.   5-Incidental acute small stroke: Acute punctate cortical/subcortical infarct in the left superior frontal gyrus -Okay to continue with Xarelto, discussed with neurology -Neurology has been consulted plan to proceed with CT angio head and neck -A1c: 4.9, LDL 48 -On xarelto. No need for ECHp, had recent echo.  -CTA head and neck no large vessel occlusion.   Hypertension: Continue with metoprolol  Hyponatremia: in setting alcohol use. Resolved.   Hypomagnesemia: Replaced.   Dyslipidemia Aortic atherosclerosis (HCC) Continue rosuvastatin    Macrocytic anemia Monitor hematocrit and hemoglobin. Nutritional supplementation.   Tobacco use disorder Uses smokeless tobacco. Cessation advised.    Hypotension; BP drop to 70 5/16, improved with IV fluids. Cardizem discontinue. He has had previous history of hypotension requiring midodrine,. Might have to consider start midodrine.   Chronic Back pain;  schedule tylenol. Robaxin  Estimated body mass index is 19.66 kg/m as calculated from the following:   Height as of this encounter: 5\' 9"  (1.753 m).   Weight as of this encounter: 60.4 kg.   DVT prophylaxis: Xarelto Code Status: Full code Family Communication: Care discussed with patient Disposition Plan:  Status is: Observation The patient remains OBS appropriate and will d/c before 2 midnights.    Consultants:  Psych Neurology   Procedures:    Antimicrobials:    Subjective: No new complaints, other than he feels palpitation when hr increases.   Objective: Vitals:   12/08/22 0044 12/08/22 0606 12/08/22 0839 12/08/22 1256  BP: 121/85 129/89  (!) 122/91  Pulse: 94 70  95  Resp: 20   16  Temp: 97.9 F (36.6 C) 97.9 F (36.6 C)  98 F (36.7 C)  TempSrc: Oral Oral  Oral  SpO2: 100%  100% 100%  Weight:      Height:        Intake/Output Summary (Last 24 hours) at 12/08/2022 1416 Last data filed at 12/08/2022 0930 Gross per 24 hour  Intake 982.03 ml  Output 2500 ml  Net -1517.97 ml    Filed Weights   12/02/22 0853  Weight: 60.4 kg    Examination:  General exam: NAD Respiratory system: CTA Cardiovascular system: S 1, S 2 RRR Gastrointestinal system: BS present, soft, nt Central nervous system: Alert, follows command Extremities: No edema   Data Reviewed: I have personally reviewed following labs and imaging studies  CBC: Recent Labs  Lab 12/02/22 0409 12/03/22 0420 12/04/22 0406 12/05/22 0857 12/08/22 0422  WBC 6.1 3.5* 4.7 4.6 4.7  NEUTROABS 3.3  --   --   --   --   HGB 11.8* 9.7* 9.1* 10.0* 9.7*  HCT 36.6* 29.0* 28.6* 30.6* 29.9*  MCV 100.5* 99.7 104.4* 103.4* 105.3*  PLT 252 175 137* 155 137*    Basic Metabolic Panel: Recent Labs  Lab 12/02/22 0409 12/03/22 0733 12/04/22 0406 12/05/22 0857 12/06/22 0824 12/07/22 0852 12/08/22 0422  NA 131*   < > 135 135 134* 137 136  K 4.4   < > 4.9 4.2 3.9 3.8 4.3  CL 99   < > 105 105 104  107 108  CO2 13*   < > 23 22 21* 22 22  GLUCOSE 95   < > 99 92 133* 119* 92  BUN 28*   < > 21 18 24* 21 23  CREATININE 1.38*   < > 0.87 0.60* 0.82 0.74 0.83  CALCIUM 9.0   < > 8.1* 8.6* 8.7* 8.9 9.1  MG 1.6*  --  1.4* 1.5* 1.5* 1.8 1.9  PHOS 5.1*  --   --   --   --   --   --    < > = values in this interval not displayed.    GFR: Estimated Creatinine Clearance: 73.8 mL/min (by C-G formula based on SCr of 0.83 mg/dL). Liver Function Tests: Recent Labs  Lab 12/02/22 0409 12/03/22 0733  AST 54* 29  ALT 21 17  ALKPHOS 77 63  BILITOT 1.5* 1.0  PROT 8.4* 6.8  ALBUMIN 4.1 3.4*    No results for input(s): "LIPASE", "AMYLASE" in the last 168 hours. Recent Labs  Lab 12/02/22 1027  AMMONIA 11    Coagulation Profile: No results for input(s): "INR", "PROTIME" in the last 168 hours. Cardiac Enzymes: No results for input(s): "CKTOTAL", "CKMB", "CKMBINDEX", "TROPONINI" in the last 168 hours. BNP (last 3 results) No results  for input(s): "PROBNP" in the last 8760 hours. HbA1C: No results for input(s): "HGBA1C" in the last 72 hours.  CBG: No results for input(s): "GLUCAP" in the last 168 hours. Lipid Profile: No results for input(s): "CHOL", "HDL", "LDLCALC", "TRIG", "CHOLHDL", "LDLDIRECT" in the last 72 hours.  Thyroid Function Tests: No results for input(s): "TSH", "T4TOTAL", "FREET4", "T3FREE", "THYROIDAB" in the last 72 hours.  Anemia Panel: No results for input(s): "VITAMINB12", "FOLATE", "FERRITIN", "TIBC", "IRON", "RETICCTPCT" in the last 72 hours.  Sepsis Labs: Recent Labs  Lab 12/02/22 0450 12/02/22 0858  LATICACIDVEN 3.5* 1.2     No results found for this or any previous visit (from the past 240 hour(s)).       Radiology Studies: No results found.      Scheduled Meds:  acetaminophen  1,000 mg Oral TID   diltiazem  30 mg Oral Q6H   feeding supplement  237 mL Oral BID BM   ferrous sulfate  325 mg Oral Q breakfast   folic acid  1 mg Oral Daily    magnesium oxide  400 mg Oral BID   methocarbamol  500 mg Oral TID   mirtazapine  7.5 mg Oral QHS   mometasone-formoterol  2 puff Inhalation BID   multivitamin with minerals  1 tablet Oral Daily   rivaroxaban  20 mg Oral Q supper   rosuvastatin  10 mg Oral Daily   tamsulosin  0.4 mg Oral QPC supper   Continuous Infusions:    LOS: 3 days    Time spent: 35 minutes    Townsend Cudworth A Hazelynn Mckenny, MD [facility]   If 7PM-7AM, please contact night-coverage [email or web address]  12/08/2022, 2:16 PM

## 2022-12-08 NOTE — Plan of Care (Signed)
  Problem: Education: Goal: Knowledge of disease or condition will improve Outcome: Progressing   Problem: Education: Goal: Knowledge of General Education information will improve Description: Including pain rating scale, medication(s)/side effects and non-pharmacologic comfort measures Outcome: Progressing   Problem: Education: Goal: Knowledge of disease or condition will improve Outcome: Progressing   Problem: Nutrition: Goal: Dietary intake will improve Outcome: Progressing

## 2022-12-08 NOTE — Progress Notes (Signed)
Occupational Therapy Treatment Patient Details Name: Anthony [name] MRN: [id number] DOB: February 07, 1956 Today's Date: 12/08/2022   History of present illness Anthony [name] is a 67 yr old male brought to the hospital with AMS. He was found to have a fib with RVR. PMH: rhabdo, skin CA, trigger finger, actinic keratosis, ETOH abuse, ETOH withdrawal, anxiety, depression, midesophagus foreign body, L rib fractures   OT comments  The pt presented with improved overall functional abilities this date, as compared to the prior OT session. He performed tasks at supervision level, including sit to stand, ambulating in the hall, and grooming in standing at sink level. He denied feelings of dizziness and shortness of breath during activity. His O2 saturation was 97% or better on room air throughout. All needed OT education and/or intervention was provided, and he does not require further OT services. OT will sign off.    Recommendations for follow up therapy are one component of a multi-disciplinary discharge planning process, led by the attending physician.  Recommendations may be updated based on patient status, additional functional criteria and insurance authorization.    Assistance Recommended at Discharge PRN  Patient can return home with the following      Equipment Recommendations  None recommended by OT       Precautions / Restrictions Restrictions Weight Bearing Restrictions: No       Mobility Bed Mobility Overal bed mobility: Modified Independent Bed Mobility: Supine to Sit     Supine to sit: Modified independent (Device/Increase time) Sit to supine: Modified independent (Device/Increase time)        Transfers   Equipment used: None Transfers: Sit to/from Stand                       ADL either performed or assessed with clinical judgement   ADL Overall ADL's : Needs assistance/impaired Eating/Feeding: Independent;Sitting Eating/Feeding Details (indicate cue type and  reason): based on clinical judgement Grooming: Supervision/safety;Standing           Upper Body Dressing : Set up;Sitting   Lower Body Dressing: Supervision/safety;Sit to/from stand   Toilet Transfer: Supervision/safety;Ambulation   Toileting- Clothing Manipulation and Hygiene: Supervision/safety;Sit to/from stand                Cognition Arousal/Alertness: Awake/alert Behavior During Therapy: WFL for tasks assessed/performed Overall Cognitive Status: No family/caregiver present to determine baseline cognitive functioning Area of Impairment: Attention, Safety/judgement                                  Pertinent Vitals/ Pain       Pain Assessment Pain Assessment: No/denies pain         Frequency   (N/A)        Progress Toward Goals  OT Goals(current goals can now be found in the care plan section)  Progress towards OT goals: Goals met/education completed, patient discharged from OT  Acute Rehab OT Goals OT Goal Formulation: With patient  Plan All goals met and education completed, patient discharged from OT services       AM-PAC OT "6 Clicks" Daily Activity     Outcome Measure   Help from another person eating meals?: None Help from another person taking care of personal grooming?: None Help from another person toileting, which includes using toliet, bedpan, or urinal?: None Help from another person bathing (including washing, rinsing, drying)?: None Help from another person to  put on and taking off regular upper body clothing?: None Help from another person to put on and taking off regular lower body clothing?: None 6 Click Score: 24    End of Session Equipment Utilized During Treatment: Gait belt  OT Visit Diagnosis: Unsteadiness on feet (R26.81);History of falling (Z91.81)   Activity Tolerance Patient tolerated treatment well   Patient Left in chair;with call bell/phone within reach;with chair alarm set   Nurse Communication  Mobility status        Time: 1610-9604 OT Time Calculation (min): 20 min  Charges: OT General Charges $OT Visit: 1 Visit OT Treatments $Therapeutic Activity: 8-22 mins    Anthony [name], OTR/L 12/08/2022, 1:13 PM

## 2022-12-08 NOTE — Progress Notes (Addendum)
Progress Note  Patient Name: Anthony [name] Date of Encounter: 12/08/2022  Primary Cardiologist: Verne Carrow, MD  Subjective   Denies CP, SOB, dizziness. Lamenting about losing his tray too early yesterday. Will plan to get orthostatics when he finishes breakfast.  Inpatient Medications    Scheduled Meds:  acetaminophen  1,000 mg Oral TID   feeding supplement  237 mL Oral BID BM   ferrous sulfate  325 mg Oral Q breakfast   folic acid  1 mg Oral Daily   magnesium oxide  400 mg Oral BID   methocarbamol  500 mg Oral TID   metoprolol tartrate  25 mg Oral BID   mirtazapine  7.5 mg Oral QHS   mometasone-formoterol  2 puff Inhalation BID   multivitamin with minerals  1 tablet Oral Daily   rivaroxaban  20 mg Oral Q supper   rosuvastatin  10 mg Oral Daily   tamsulosin  0.4 mg Oral QPC supper   Continuous Infusions:  thiamine (VITAMIN B1) injection 500 mg (12/07/22 0921)   PRN Meds: acetaminophen **OR** acetaminophen, LORazepam, ondansetron **OR** ondansetron (ZOFRAN) IV   Vital Signs    Vitals:   12/07/22 1757 12/07/22 1914 12/08/22 0044 12/08/22 0606  BP:  117/78 121/85 129/89  Pulse:  96 94 70  Resp:  16 20   Temp:  98.4 F (36.9 C) 97.9 F (36.6 C) 97.9 F (36.6 C)  TempSrc:  Oral Oral Oral  SpO2: 95% 96% 100%   Weight:      Height:        Intake/Output Summary (Last 24 hours) at 12/08/2022 0805 Last data filed at 12/08/2022 4098 Gross per 24 hour  Intake 1582.03 ml  Output 2550 ml  Net -967.97 ml      12/02/2022    8:53 AM 10/10/2022    9:00 AM 10/10/2022    2:00 AM  Last 3 Weights  Weight (lbs) 133 lb 2.5 oz 147 lb 0.8 oz 149 lb 14.6 oz  Weight (kg) 60.4 kg 66.7 kg 68 kg     Telemetry    AF rates 90s-120s - Personally Reviewed  Physical Exam   GEN: Dissheveled appearing WM in NAD  HEENT: Normocephalic, atraumatic, sclera non-icteric. Matted hair in back of head Neck: No JVD or bruits. Cardiac: Irregularly irregular, mildly tachycardic, no  murmurs, rubs, or gallops.  Respiratory: Clear to auscultation bilaterally. Breathing is unlabored. GI: Soft, nontender, non-distended, BS +x 4. MS: no deformity. Extremities: No clubbing or cyanosis. No edema. Distal pedal pulses are 2+ and equal bilaterally. Neuro:  AAOx3. Follows commands. Psych:  Responds to questions appropriately with a normal affect though perseverating on tray  Labs    High Sensitivity Troponin:   Recent Labs  Lab 12/02/22 0409 12/02/22 0858  TROPONINIHS 11 10      Cardiac EnzymesNo results for input(s): "TROPONINI" in the last 168 hours. No results for input(s): "TROPIPOC" in the last 168 hours.   Chemistry Recent Labs  Lab 12/02/22 0409 12/03/22 0733 12/04/22 0406 12/05/22 0857 12/06/22 0824 12/07/22 0852  NA 131* 133*   < > 135 134* 137  K 4.4 3.3*   < > 4.2 3.9 3.8  CL 99 104   < > 105 104 107  CO2 13* 22   < > 22 21* 22  GLUCOSE 95 90   < > 92 133* 119*  BUN 28* 20   < > 18 24* 21  CREATININE 1.38* 0.69   < > 0.60* 0.82 0.74  CALCIUM 9.0 8.3*   < > 8.6* 8.7* 8.9  PROT 8.4* 6.8  --   --   --   --   ALBUMIN 4.1 3.4*  --   --   --   --   AST 54* 29  --   --   --   --   ALT 21 17  --   --   --   --   ALKPHOS 77 63  --   --   --   --   BILITOT 1.5* 1.0  --   --   --   --   GFRNONAA 56* >60   < > >60 >60 >60  ANIONGAP 19* 7   < > 8 9 8    < > = values in this interval not displayed.     Hematology Recent Labs  Lab 12/04/22 0406 12/05/22 0857 12/08/22 0422  WBC 4.7 4.6 4.7  RBC 2.74* 2.96* 2.84*  HGB 9.1* 10.0* 9.7*  HCT 28.6* 30.6* 29.9*  MCV 104.4* 103.4* 105.3*  MCH 33.2 33.8 34.2*  MCHC 31.8 32.7 32.4  RDW 20.8* 20.8* 20.5*  PLT 137* 155 137*    BNP Recent Labs  Lab 12/02/22 0409  BNP 341.6*     DDimer No results for input(s): "DDIMER" in the last 168 hours.   Radiology    No results found.  Cardiac Studies   Last echo 08/2022 Sonographer Comments: Technically difficult study due to poor echo  windows,  suboptimal parasternal window, suboptimal apical window and  suboptimal subcostal window. Image acquisition challenging due to  uncooperative patient and Image acquisition  challenging due to patient behavioral factors. Patient in restraints  attempting to get up during study. Patient supine and could not turn.   1. Left ventricular ejection fraction, by estimation, is 60 to 65%. The  left ventricle has normal function. The left ventricle has no regional  wall motion abnormalities. Left ventricular diastolic parameters are  indeterminate.   2. Right ventricular systolic function is normal. The right ventricular  size is mildly enlarged. Tricuspid regurgitation signal is inadequate for  assessing PA pressure.   3. The mitral valve is normal in structure. No evidence of mitral valve  regurgitation. No evidence of mitral stenosis.   4. The aortic valve was not well visualized. Aortic valve regurgitation  is not visualized. No aortic stenosis is present.     Patient Profile     67 y.o. 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 Has had numerous prior admissions/ER visits for various medical issues including sepsis, aspiration PNA, traumatic pneumothorax, pressure ulcer, C Diff, delirium. Patient presented this admission via [employer] for hallucinations. He was stating there was a religious group outside his window and people were trying to break into his home. He told neurology he was 66 years old because he had lived twice the amount of life in 67 years. He has been suspicious of staff stealing his food tray early and confiscating phones as an "inside job." Had not been taking  his meds PTA. He was found to have recurrent AF RVR, incidental small stroke, elevated LFTs, hyponatremia, hypomagnesemia, macrocytic anemia, hypotension.   Assessment & Plan    1. Paroxysmal atrial fibrillation, with management made challenging by orthostatic hypotension - has  been ongoing issue during frequent readmissions, reported to have last converted to NSR 08/2022 on diltiazem, not adherent with OP f/u, med compliance concerns - rate control strategy recommended given acute issues with ETOH, delirium, concern for noncompliance - TSH wnl - HR 90s-130s - Xarelto resumed in setting of persistent AF, stroke - needs VERY CLOSE outpatient f/u including primary care to help manage comorbid conditions, psychiatric concerns - previously on IV diltiazem but stopped due to hypotension per notes - currenty on metoprolol 25mg  BID - orthostatics still positive 5/20 so received IVF - recheck pending today prior to adjusting dose. If orthostasis persists can consider midodrine. Addendum: Lying: 128/84, HR 110; Sitting: 124/84, HR 132; Standing 0 mins: 125/75, HR 126; Standing 3 mins: 105/76, HR 134. Will review med plan with MD   2. Stroke - CTA head/neck without large vessel occlusion - per neuro/IM, repeat echo not needed given anticoagulated with Xarelto and recent echo 08/2022 (patient was not well cooperative with the study but EF was normal)   3. Prior h/o acute diastolic HF in setting of Afib - volume status looks OK   4. Moderate CAD - by cor CT 2022 - no ASA given ETOH/fall risk, need for DOAC - on beta blocker - on statin, LDL 48   5. Hallucinations in setting of alcohol use disorder, homelessness - UDS + barbiturates, neg cocaine - per primary team - increased risk of rehospitalization given social issues   6. Hypomagnesemia, hypokalemia - in setting of ETOH use - further lyte management per primary team - recommend keeping K 4.0 or greater, Mg 2.0 or greater - has not had lytes  checked today so will order  7. Chronic appearing anemia, also mild thrombocytopenia - per IM  For questions or updates, please contact [facility] Please consult [email or web address] for contact info under Cardiology/STEMI.  Signed, Laurann Montana, PA-C 12/08/2022, 8:05 AM    Patient seen and examined, note reviewed with the signed Advanced Practice Provider. I personally reviewed laboratory data, imaging studies and relevant notes. I independently examined the patient and formulated the important aspects of the plan. I have personally discussed the plan with the patient and/or family. Comments or changes to the note/plan are indicated below.   A bit abrasive and short this morning during my encounter.  Paroxysmal atrial fibrillation CVA History of chronic diastolic heart failure-clinically appears to be euvolemic Moderate CAD Chronic alcohol use disorder with hallucination Homelessness-May need to have social work evaluation Electrolyte abnormalities   Clinically orthostatic vitals has improved with dehydration.  Will continue to monitor.  Unfortunately he is still in atrial fibrillation with rapid ventricular rate.  He has responded well in the past to Cardizem.  So we will stop the beta-blocker and transition him to Cardizem. Continue Xarelto has been started for the stroke prevention.  He has no anginal symptoms.  Not on aspirin due to currently being on Xarelto.  He will benefit from evaluation for care navigation/social worker. Keep mag above 2 and K above 4.     Thomasene Ripple DO, MS [facility] Attending Cardiologist [facility]  [address] [place], [place] [address] [phone] Website: [email or web address]

## 2022-12-09 DIAGNOSIS — I4891 Unspecified atrial fibrillation: Secondary | ICD-10-CM | POA: Diagnosis not present

## 2022-12-09 LAB — BASIC METABOLIC PANEL
Anion gap: 7 (ref 5–15)
BUN: 20 mg/dL (ref 8–23)
CO2: 23 mmol/L (ref 22–32)
Calcium: 8.9 mg/dL (ref 8.9–10.3)
Chloride: 108 mmol/L (ref 98–111)
Creatinine, Ser: 0.86 mg/dL (ref 0.61–1.24)
GFR, Estimated: >60 mL/min (ref >60)
Glucose, Bld: 92 mg/dL (ref 70–99)
Potassium: 4.2 mmol/L (ref 3.5–5.1)
Sodium: 138 mmol/L (ref 135–145)

## 2022-12-09 LAB — CBC
HCT: 32.2 % — ABNORMAL LOW (ref 39.0–52.0)
Hemoglobin: 10.3 g/dL — ABNORMAL LOW (ref 13.0–17.0)
MCH: 33.8 pg (ref 26.0–34.0)
MCHC: 32 g/dL (ref 30.0–36.0)
MCV: 105.6 fL — ABNORMAL HIGH (ref 80.0–100.0)
Platelets: 166 10*3/uL (ref 150–400)
RBC: 3.05 MIL/uL — ABNORMAL LOW (ref 4.22–5.81)
RDW: 20.7 % — ABNORMAL HIGH (ref 11.5–15.5)
WBC: 4.7 10*3/uL (ref 4.0–10.5)
nRBC: 0 % (ref 0.0–0.2)

## 2022-12-09 LAB — MAGNESIUM: Magnesium: 1.6 mg/dL — ABNORMAL LOW (ref 1.7–2.4)

## 2022-12-09 MED ORDER — LORATADINE 10 MG PO TABS
10.0000 mg | ORAL_TABLET | Freq: Every day | ORAL | Status: DC
  Administered 2022-12-09 – 2022-12-18 (×10): 10 mg via ORAL
  Filled 2022-12-09 (×10): qty 1

## 2022-12-09 MED ORDER — MAGNESIUM SULFATE 2 GM/50ML IV SOLN
2.0000 g | Freq: Once | INTRAVENOUS | Status: AC
  Administered 2022-12-09: 2 g via INTRAVENOUS
  Filled 2022-12-09: qty 50

## 2022-12-09 MED ORDER — TRAMADOL HCL 50 MG PO TABS
50.0000 mg | ORAL_TABLET | Freq: Four times a day (QID) | ORAL | Status: DC | PRN
  Administered 2022-12-13: 50 mg via ORAL
  Filled 2022-12-09: qty 1

## 2022-12-09 MED ORDER — MIDODRINE HCL 5 MG PO TABS
5.0000 mg | ORAL_TABLET | Freq: Three times a day (TID) | ORAL | Status: DC
  Administered 2022-12-09: 5 mg via ORAL
  Filled 2022-12-09: qty 1

## 2022-12-09 MED ORDER — POLYVINYL ALCOHOL 1.4 % OP SOLN
1.0000 [drp] | OPHTHALMIC | Status: DC | PRN
  Administered 2022-12-09: 1 [drp] via OPHTHALMIC
  Filled 2022-12-09: qty 15

## 2022-12-09 MED ORDER — LIDOCAINE 5 % EX PTCH
1.0000 | MEDICATED_PATCH | CUTANEOUS | Status: DC
  Administered 2022-12-09 – 2022-12-17 (×9): 1 via TRANSDERMAL
  Filled 2022-12-09 (×9): qty 1

## 2022-12-09 MED ORDER — ORAL CARE MOUTH RINSE: 15.0000 mL | OROMUCOSAL | Status: DC | PRN

## 2022-12-09 NOTE — Progress Notes (Addendum)
Rounding Note    Patient Name: Anthony [name] Date of Encounter: 12/09/2022  [facility] Cardiologist: Verne Carrow, MD   Subjective   No acute overnight events. Patient denies any cardiac complaints this morning - no chest pain, shortness of breath, significant palpitations, lightheadedness or dizziness. He states he is depressed and feels like he would be better if he was not here in the hospital. He feels like we have not done much for him since he has been here. He remains in atrial fibrillation with rates currently in the 100s to 110s.  Inpatient Medications    Scheduled Meds:  acetaminophen  1,000 mg Oral TID   diltiazem  30 mg Oral Q6H   feeding supplement  237 mL Oral BID BM   ferrous sulfate  325 mg Oral Q breakfast   folic acid  1 mg Oral Daily   magnesium oxide  400 mg Oral BID   methocarbamol  500 mg Oral TID   mirtazapine  7.5 mg Oral QHS   mometasone-formoterol  2 puff Inhalation BID   multivitamin with minerals  1 tablet Oral Daily   rivaroxaban  20 mg Oral Q supper   rosuvastatin  10 mg Oral Daily   tamsulosin  0.4 mg Oral QPC supper   Continuous Infusions:  PRN Meds: acetaminophen **OR** acetaminophen, LORazepam, ondansetron **OR** ondansetron (ZOFRAN) IV, mouth rinse   Vital Signs    Vitals:   12/08/22 1256 12/08/22 1715 12/08/22 2332 12/09/22 0657  BP: (!) 122/91 110/77 111/79 (!) 131/93  Pulse: 95 93  95  Resp: 16 16 18 16   Temp: 98 F (36.7 C) 98 F (36.7 C) 97.6 F (36.4 C) 97.7 F (36.5 C)  TempSrc: Oral Oral Oral Oral  SpO2: 100% 100% 100% 100%  Weight:      Height:        Intake/Output Summary (Last 24 hours) at 12/09/2022 0828 Last data filed at 12/09/2022 0500 Gross per 24 hour  Intake 970 ml  Output 2250 ml  Net -1280 ml      12/02/2022    8:53 AM 10/10/2022    9:00 AM 10/10/2022    2:00 AM  Last 3 Weights  Weight (lbs) 133 lb 2.5 oz 147 lb 0.8 oz 149 lb 14.6 oz  Weight (kg) 60.4 kg 66.7 kg 68 kg       Telemetry    Atrial fibrillation. Resting heart rates in the 80s overnight with brief spikes in the 170s Rates mostly in the 100s to 110s this morning (but has high as the 130s-140s at times). - Personally Reviewed  ECG    NO ECG tracing today. - Personally Reviewed  Physical Exam   GEN: Caucasian male resting comfortably in no acute distress.  Appears dissheveled. Neck: No JVD Cardiac: Tachycardic and irregularly irregular. No murmurs, rubs, or gallops.  Respiratory: Clear to auscultation bilaterally. No wheezes, rhonchi, or rales. GI: Soft, non-distended, and non-tender. MS: No lower extremity edema. No deformity. Skin: Warm and dry. Neuro:  No focal deficits. Psych: Normal affect. Responds appropriately.  Labs    High Sensitivity Troponin:   Recent Labs  Lab 12/02/22 0409 12/02/22 0858  TROPONINIHS 11 10     Chemistry Recent Labs  Lab 12/03/22 0733 12/04/22 0406 12/07/22 0852 12/08/22 0422 12/09/22 0434  NA 133*   < > 137 136 138  K 3.3*   < > 3.8 4.3 4.2  CL 104   < > 107 108 108  CO2 22   < >  22 22 23   GLUCOSE 90   < > 119* 92 92  BUN 20   < > 21 23 20   CREATININE 0.69   < > 0.74 0.83 0.86  CALCIUM 8.3*   < > 8.9 9.1 8.9  MG  --    < > 1.8 1.9 1.6*  PROT 6.8  --   --   --   --   ALBUMIN 3.4*  --   --   --   --   AST 29  --   --   --   --   ALT 17  --   --   --   --   ALKPHOS 63  --   --   --   --   BILITOT 1.0  --   --   --   --   GFRNONAA >60   < > >60 >60 >60  ANIONGAP 7   < > 8 6 7    < > = values in this interval not displayed.    Lipids  Recent Labs  Lab 12/04/22 0406  CHOL 121  TRIG 63  HDL 60  LDLCALC 48  CHOLHDL 2.0    Hematology Recent Labs  Lab 12/05/22 0857 12/08/22 0422 12/09/22 0434  WBC 4.6 4.7 4.7  RBC 2.96* 2.84* 3.05*  HGB 10.0* 9.7* 10.3*  HCT 30.6* 29.9* 32.2*  MCV 103.4* 105.3* 105.6*  MCH 33.8 34.2* 33.8  MCHC 32.7 32.4 32.0  RDW 20.8* 20.5* 20.7*  PLT 155 137* 166   Thyroid  Recent Labs  Lab  12/02/22 1027  TSH 3.074    BNPNo results for input(s): "BNP", "PROBNP" in the last 168 hours.  DDimer No results for input(s): "DDIMER" in the last 168 hours.   Radiology    No results found.  Cardiac Studies   Coronary CTA 10/03/2020: Impressions: 1. Coronary calcium score of 884. This was 91st percentile for age, sex, and race matched control. 2. Normal coronary origin.  Right dominance. 3. CAD-RADS 3. Moderate stenosis. Consider symptom-guided anti-ischemic pharmacotherapy as well as risk factor modification per guideline directed care. Additional analysis with CT FFR will be Submitted. 4. 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  FFR Summary: 1. CT FFR analysis shows no evidence of significant functional stenosis. Recommend aggressive risk factor management for non-obstructive calcifications. _______________  Echocardiogram 08/28/2022: Impressions: 1. Left ventricular ejection fraction, by estimation, is 60 to 65%. The  left ventricle has normal function. The left ventricle has no regional  wall motion abnormalities. Left ventricular diastolic parameters are  indeterminate.   2. Right ventricular systolic function is normal. The right ventricular  size is mildly enlarged. Tricuspid regurgitation signal is inadequate for  assessing PA pressure.   3. The mitral valve is normal in structure. No evidence of mitral valve  regurgitation. No evidence of mitral stenosis.   4. The aortic valve was not well visualized. Aortic valve regurgitation  is not visualized. No aortic stenosis is present.    Patient Profile     67 y.o. male with a history of moderate non-obstructive CAD noted on coronary CTA in 09/2020, paroxysmal atrial fibrillation on Xarelto, chronic diastolic CHF, PE in 2018, hypertension complicated by orthostatic hypotension, surgically corrected hydrocephalus, chronic back pain with a lumbar compression  fracture, pancytopenia, alcohol abuse with prior withdrawal, tobacco abuse, homelessness, and medication non-compliance. Patient has had numerous prior ED visits and admission for various medical issues including sepsis, aspiration  pneumonia, traumatic pneumothorax, pressure ulcer, and C.Diff. He presented this admission on 12/02/2022 via [employer] for hallucinations. He was stating there was a religious group outside his window and people were trying to break into his home. He told Neurology that he was 67 years old because he had lived twice the amount of life in 67 years. He has been suspicious of staff stealing his food tray and confiscating phone as an "inside job." He was found to have an incidental small stroke. Cardiology was consulted on 12/04/2022 for further evaluation of recurrent atrial fibrillation with RVR and hypotension at the request of Dr. Sunnie Nielsen.   Assessment & Plan    Persistent Atrial Fibrillation Patient has a history of paroxysmal atrial fibrillation which has been an ongoing issue during his frequent readmissions. He he has been non adherent with medications and outpatient follow-ups. Plan is for rate control strategy given acute issues including ETOH, delirium, and concern for non-compliance. However, rate control is complicated by his orthostatic hypotension. TSH normal. Echo in 08/2022 showed LVEF of 60-65%. - Rates were mostly in the 80s overnight with brief spikes in the 170 (wonder if this during times where he became more agitated). This morning rates are mostly in the low 100s to 110s at rest but as high as the 130s to 140s at times.  - Magnesium 1.6 today. Being repleted by primary team. Goal is to keep this above 2.0. - Potassium 4.2 today. Goal is to keep above 4.0.  - He has previously responded well to Cardizem. Therefore, Metoprolol was stopped and he was started on Cardizem 30mg  every 6 hours yesterday. Will recheck orthostatic vital signs today and can then increase  Cardizem to 60mg  every 6 hours. - Continue chronic anticoagulation with Xarelto 20mg  daily.  Orthostatic Hypotension Initially treated with IV fluids. Slightly orthostatic yesterday with BP dropping from 128/84 supine to 105/76 after standing for 3 minutes yesterday. However, he is asymptomatic with this.  - Will repeat orthostatic vitals today and if stable, will likely increase Cardizem for additional rate control as mentioned above. - Will order compressions stockings.  - Discussed importance of staying well hydrated, avoiding alcohol, and changing positions slowly.  Chronic Diastolic CHF History of diastolic CHF in setting of atrial fibrillation. Recent Echo in 08/2022 showed LVEF of 60-65% with no regional wall motion abnormalities and  mildly enlarged RV with normal RV function. - No issues with volume overload this admission.  - No diuretics given orthostatic hypotension.  Moderate Non-Obstructive CAD Noted on coronary CTA in 09/2020.  - No chest pain.  - No aspirin given need for DOAC. - Continue statin.  Otherwise, per primary team: - Stroke  - Hypomagnesemia - Chronic anemia - Hallucinations  - Alcohol use disorder - Homelessness  For questions or updates, please contact [facility] Please consult [email or web address] for contact info under     Signed, Corrin Parker, PA-C  12/09/2022, 8:28 AM    Patient seen and examined, note reviewed with the signed Advanced Practice Provider. I personally reviewed laboratory data, imaging studies and relevant notes. I independently examined the patient and formulated the important aspects of the plan. I have personally discussed the plan with the patient and/or family. Comments or changes to the note/plan are indicated below.  Is more apologetic this morning for his actions yesterday and towards Marjie Skiff, PA this morning.  Paroxysmal atrial fibrillation CVA History of chronic diastolic heart failure-clinically appears  to be euvolemic Moderate CAD Chronic alcohol use disorder  with hallucination Homelessness-May need to have social work evaluation Electrolyte abnormalities   Heart rate still has not yet controlled.  Please get orthostatics vitals if this is within normal we will increase his Cardizem to 60 mg every 6 hours.  Continue Xarelto has been started for the stroke prevention.   He has no anginal symptoms.  Not on aspirin due to currently being on Xarelto.   He will benefit from evaluation for care navigation/social worker. Keep mag above 2 and K above 4.   Thomasene Ripple DO, MS [facility] Attending Cardiologist [facility]  [address] [place], [place] [address] [phone] Website: [email or web address]

## 2022-12-09 NOTE — TOC Progression Note (Signed)
Transition of Care [facility]) - Progression Note    Patient Details  Name: Anthony [name] MRN: [id number] Date of Birth: Dec 13, 1955  Transition of Care [facility]) CM/SW Contact  Howell Rucks, RN Phone Number: 12/09/2022, 10:23 AM  Clinical Narrative:  Teams chat received from attending physician requesting assistance in scheduling post discharge follow up appt. Appt scheduled at [facility] December 21, 2022 at 2:15pm, appt entered in AVS. [facility] referral received for referral to pain management clinic, MD notified [facility] does not manage pain clinic referrals, MD responded pain management referral can be handled as outpatient. Will continue to follow.     Expected Discharge Plan: Homeless Shelter Barriers to Discharge: Continued Medical Work up  Expected Discharge Plan and Services   Discharge Planning Services: CM Consult   Living arrangements for the past 2 months: Group Home                                       Social Determinants of Health (SDOH) Interventions SDOH Screenings   Food Insecurity: Food Insecurity Present (12/02/2022)  Housing: High Risk (12/02/2022)  Transportation Needs: Unmet Transportation Needs (12/02/2022)  Utilities: At Risk (12/02/2022)  Depression (PHQ2-9): Medium Risk (10/16/2020)  Tobacco Use: High Risk (12/02/2022)    Readmission Risk Interventions    10/12/2022    1:29 PM 08/28/2022    2:00 PM 08/27/2022   10:30 AM  Readmission Risk Prevention Plan  Transportation Screening Complete Complete Complete  Medication Review Oceanographer) Complete  Complete  PCP or Specialist appointment within 3-5 days of discharge Complete Complete Complete  HRI or Home Care Consult Complete  Complete  SW Recovery Care/Counseling Consult Complete Complete Complete  Palliative Care Screening Not Applicable Not Applicable Not Applicable  Skilled Nursing Facility Not Applicable Not Applicable Not Applicable

## 2022-12-09 NOTE — Progress Notes (Signed)
PROGRESS NOTE  Anthony [name] ZOX:[id number] DOB: April 26, 1956 DOA: 12/02/2022 PCP: Pcp, No   LOS: 4 days   Brief Narrative / Interim history: 67 year old male, homeless, chronic alcohol use, anxiety, depression comes to the hospital with confusion.  He was noted to have hallucinations.  Apparently he has not been taking his medications.  He was found to be in A-fib with RVR in the ED, and admitted to the hospital.  Cardiology as well as psychiatry were consulted  Subjective / 24h Interval events: Has a significant number of complaints today.  He was promised a haircut on admission, wants help with housing, wants a PCP, wants referral to pain management and so on.  Denies any chest pain, denies any shortness of breath.  Assesement and Plan: Principal Problem:   Atrial fibrillation with RVR (HCC) Active Problems:   HTN (hypertension)   Paroxysmal atrial fibrillation (HCC)   Hypomagnesemia   Dyslipidemia   Macrocytic anemia   Alcohol abuse with alcohol-induced mood disorder (HCC)   Tobacco use disorder   Hyponatremia   Aortic atherosclerosis (HCC)   Abnormal LFTs   Stroke (cerebrum) (HCC)  Principal problem PAF with RVR -cardiology following, currently with rates still poorly controlled.  On diltiazem 30 mg every 6.  Further adjustments per cards team.  Continue anticoagulation with Xarelto  Active problems Acute metabolic encephalopathy, hallucinations -likely in the setting of EtOH use/withdrawals.  Psychiatry consulted and evaluated patient.  He was started on Remeron.  Currently resolved and he is back to baseline  Ongoing alcohol abuse with alcohol-induced mood disorder -patient quite irritable when this is being brought up, he complains of chronic pain that is not properly addressed as an outpatient.  He wants better pain control as an outpatient, PCP and pain management before we can even tell him that he needs to stop drinking.  Very adamant about that.  PCP referral made, he has  an appointment with IM TS on 6/3.  Abnormal LFTs -due to EtOH, improved  Incidental small CVA -noticed on the MRI on 5/16, which showed a punctate acute cortical/subcortical infarct in the left superior frontal gyrus without hemorrhage or mass effect.  Neurology consulted, underwent a CTA without LVO, 2D echo was not done due to being recently obtained couple of months ago, showing LVEF 60-65% without WMA.  RV was normal.  A1c was 4.9, LDL 48.  Currently on Xarelto, statin  Essential hypertension-on diltiazem  Hyperlipidemia-continue statin  Hypotension -improved after fluids  Homelessness -lives at [facility]  Scheduled Meds:  acetaminophen  1,000 mg Oral TID   diltiazem  30 mg Oral Q6H   feeding supplement  237 mL Oral BID BM   ferrous sulfate  325 mg Oral Q breakfast   folic acid  1 mg Oral Daily   magnesium oxide  400 mg Oral BID   methocarbamol  500 mg Oral TID   mirtazapine  7.5 mg Oral QHS   mometasone-formoterol  2 puff Inhalation BID   multivitamin with minerals  1 tablet Oral Daily   rivaroxaban  20 mg Oral Q supper   rosuvastatin  10 mg Oral Daily   tamsulosin  0.4 mg Oral QPC supper   Continuous Infusions:  magnesium sulfate bolus IVPB 2 g (12/09/22 0931)   PRN Meds:.acetaminophen **OR** acetaminophen, LORazepam, ondansetron **OR** ondansetron (ZOFRAN) IV, mouth rinse  Current Outpatient Medications  Medication Instructions   acetaminophen (TYLENOL) 500 mg, Oral, Every 8 hours PRN   BAYER BACK & BODY PAIN EX ST 500-32.5  MG TABS 1-2 tablets, Oral, Every 6 hours PRN   budesonide-formoterol (SYMBICORT) 160-4.5 MCG/ACT inhaler 2 puffs, Inhalation, 2 times daily   diltiazem (CARDIZEM SR) 60 mg, Oral, Every 12 hours   ferrous sulfate 325 mg, Oral, Daily with breakfast   folic acid (FOLVITE) 1 mg, Oral, Daily   furosemide (LASIX) 20 mg, Oral, Daily   Magnesium Oxide 400 mg, Oral, 2 times daily   methocarbamol (ROBAXIN) 500 mg, Oral, Every 6 hours PRN   metoprolol tartrate  (LOPRESSOR) 25 MG tablet Take 1/2 tablet (12.5 mg total) by mouth 2 (two) times daily.   Multiple Vitamins-Minerals (CENTRUM SILVER 50+MEN) TABS 1 tablet, Oral, Every other day   Nutritional Supplements (ENSURE HIGH PROTEIN) LIQD 237 mLs, Oral, Daily   rivaroxaban (XARELTO) 20 MG TABS tablet Take 1 tablet (20 mg) by mouth daily with supper.   rosuvastatin (CRESTOR) 10 mg, Oral, Daily   tamsulosin (FLOMAX) 0.4 mg, Oral, Daily after supper   TYLENOL PM EXTRA STRENGTH 500-25 MG TABS tablet 2 tablets, Oral, At bedtime PRN    Diet Orders (From admission, onward)     Start     Ordered   12/03/22 1207  Diet regular Room service appropriate? Yes; Fluid consistency: Thin  Diet effective now       Question Answer Comment  Room service appropriate? Yes   Fluid consistency: Thin      12/03/22 1208            DVT prophylaxis:  rivaroxaban (XARELTO) tablet 20 mg   Lab Results  Component Value Date   PLT 166 12/09/2022      Code Status: Full Code  Family Communication: no family at bedside   Status is: Inpatient  Remains inpatient appropriate because: A fib RVR   Level of care: Progressive  Consultants:  Cardiology   Objective: Vitals:   12/08/22 1715 12/08/22 2332 12/09/22 0657 12/09/22 0907  BP: 110/77 111/79 (!) 131/93   Pulse: 93  95   Resp: 16 18 16    Temp: 98 F (36.7 C) 97.6 F (36.4 C) 97.7 F (36.5 C)   TempSrc: Oral Oral Oral   SpO2: 100% 100% 100% 98%  Weight:      Height:        Intake/Output Summary (Last 24 hours) at 12/09/2022 1029 Last data filed at 12/09/2022 0800 Gross per 24 hour  Intake 970 ml  Output 1550 ml  Net -580 ml   Wt Readings from Last 3 Encounters:  12/02/22 60.4 kg  10/10/22 66.7 kg  10/06/22 68 kg    Examination:  Constitutional: NAD Eyes: no scleral icterus ENMT: Mucous membranes are moist.  Neck: normal, supple Respiratory: clear to auscultation bilaterally, no wheezing, no crackles. Normal respiratory effort.   Cardiovascular: Irregularly irregular, no edema Abdomen: non distended, no tenderness. Bowel sounds positive.  Musculoskeletal: no clubbing / cyanosis.   Data Reviewed: I have independently reviewed following labs and imaging studies   CBC Recent Labs  Lab 12/03/22 0420 12/04/22 0406 12/05/22 0857 12/08/22 0422 12/09/22 0434  WBC 3.5* 4.7 4.6 4.7 4.7  HGB 9.7* 9.1* 10.0* 9.7* 10.3*  HCT 29.0* 28.6* 30.6* 29.9* 32.2*  PLT 175 137* 155 137* 166  MCV 99.7 104.4* 103.4* 105.3* 105.6*  MCH 33.3 33.2 33.8 34.2* 33.8  MCHC 33.4 31.8 32.7 32.4 32.0  RDW 20.2* 20.8* 20.8* 20.5* 20.7*    Recent Labs  Lab 12/03/22 5409 12/03/22 8119 12/04/22 0406 12/05/22 1478 12/06/22 2956 12/07/22 2130 12/08/22 0422 12/09/22 0434  NA 133*  --  135 135 134* 137 136 138  K 3.3*  --  4.9 4.2 3.9 3.8 4.3 4.2  CL 104  --  105 105 104 107 108 108  CO2 22  --  23 22 21* 22 22 23   GLUCOSE 90  --  99 92 133* 119* 92 92  BUN 20  --  21 18 24* 21 23 20   CREATININE 0.69  --  0.87 0.60* 0.82 0.74 0.83 0.86  CALCIUM 8.3*  --  8.1* 8.6* 8.7* 8.9 9.1 8.9  AST 29  --   --   --   --   --   --   --   ALT 17  --   --   --   --   --   --   --   ALKPHOS 63  --   --   --   --   --   --   --   BILITOT 1.0  --   --   --   --   --   --   --   ALBUMIN 3.4*  --   --   --   --   --   --   --   MG  --    < > 1.4* 1.5* 1.5* 1.8 1.9 1.6*  HGBA1C  --   --  4.9  --   --   --   --   --    < > = values in this interval not displayed.    ------------------------------------------------------------------------------------------------------------------ No results for input(s): "CHOL", "HDL", "LDLCALC", "TRIG", "CHOLHDL", "LDLDIRECT" in the last 72 hours.  Lab Results  Component Value Date   HGBA1C 4.9 12/04/2022   ------------------------------------------------------------------------------------------------------------------ No results for input(s): "TSH", "T4TOTAL", "T3FREE", "THYROIDAB" in the last 72  hours.  Invalid input(s): "FREET3"  Cardiac Enzymes No results for input(s): "CKMB", "TROPONINI", "MYOGLOBIN" in the last 168 hours.  Invalid input(s): "CK" ------------------------------------------------------------------------------------------------------------------    Component Value Date/Time   BNP 341.6 (H) 12/02/2022 0409    CBG: No results for input(s): "GLUCAP" in the last 168 hours.  No results found for this or any previous visit (from the past 240 hour(s)).   Radiology Studies: No results found.   Pamella Pert, MD, PhD [facility]  Between 7 am - 7 pm I am available, please contact me via Amion (for emergencies) or Securechat (non urgent messages)  Between 7 pm - 7 am I am not available, please contact night coverage MD/APP via Amion

## 2022-12-09 NOTE — Progress Notes (Signed)
   Orthostatic VS for the past 24 hrs (Last 3 readings):  BP- Lying Pulse- Lying BP- Sitting Pulse- Sitting BP- Standing at 0 minutes Pulse- Standing at 0 minutes  12/09/22 1141 98/76 102 96/70 109 (!) 65/56 92   Discussed with Dr. Servando Salina. Will start Midodrine 5mg  three time daily. Will also place order for thigh-high compression socks.  Will hold off on increasing Cardizem for now.  Corrin Parker, PA-C 12/09/2022 4:29 PM

## 2022-12-09 NOTE — Progress Notes (Signed)
Mobility Specialist - Progress Note   12/09/22 1535  Mobility  Activity Ambulated independently in hallway  Level of Assistance Independent  Assistive Device None  Distance Ambulated (ft) 350 ft  Activity Response Tolerated well  Mobility Referral Yes  $Mobility charge 1 Mobility  Mobility Specialist Start Time (ACUTE ONLY) 0325  Mobility Specialist Stop Time (ACUTE ONLY) 0334  Mobility Specialist Time Calculation (min) (ACUTE ONLY) 9 min   Pt received in bed and agreeable to mobility. No complaints during session. Pt to bed after session with all needs met.    During mobility: 136 HR Post-mobility: 96 HR  [facility] Paediatric nurse

## 2022-12-09 NOTE — Plan of Care (Signed)
  Problem: Education: Goal: Knowledge of disease or condition will improve Outcome: Progressing   Problem: Activity: Goal: Ability to tolerate increased activity will improve Outcome: Progressing   Problem: Education: Goal: Knowledge of disease or condition will improve Outcome: Progressing   Problem: Nutrition: Goal: Dietary intake will improve Outcome: Progressing

## 2022-12-10 DIAGNOSIS — I4891 Unspecified atrial fibrillation: Secondary | ICD-10-CM | POA: Diagnosis not present

## 2022-12-10 LAB — COMPREHENSIVE METABOLIC PANEL
ALT: 35 U/L (ref 0–44)
AST: 38 U/L (ref 15–41)
Albumin: 3.4 g/dL — ABNORMAL LOW (ref 3.5–5.0)
Alkaline Phosphatase: 52 U/L (ref 38–126)
Anion gap: 9 (ref 5–15)
BUN: 27 mg/dL — ABNORMAL HIGH (ref 8–23)
CO2: 22 mmol/L (ref 22–32)
Calcium: 9.1 mg/dL (ref 8.9–10.3)
Chloride: 107 mmol/L (ref 98–111)
Creatinine, Ser: 0.71 mg/dL (ref 0.61–1.24)
GFR, Estimated: >60 mL/min (ref >60)
Glucose, Bld: 78 mg/dL (ref 70–99)
Potassium: 4.1 mmol/L (ref 3.5–5.1)
Sodium: 138 mmol/L (ref 135–145)
Total Bilirubin: 0.4 mg/dL (ref 0.3–1.2)
Total Protein: 7.2 g/dL (ref 6.5–8.1)

## 2022-12-10 LAB — CBC
HCT: 30.8 % — ABNORMAL LOW (ref 39.0–52.0)
Hemoglobin: 9.6 g/dL — ABNORMAL LOW (ref 13.0–17.0)
MCH: 33.1 pg (ref 26.0–34.0)
MCHC: 31.2 g/dL (ref 30.0–36.0)
MCV: 106.2 fL — ABNORMAL HIGH (ref 80.0–100.0)
Platelets: 206 10*3/uL (ref 150–400)
RBC: 2.9 MIL/uL — ABNORMAL LOW (ref 4.22–5.81)
RDW: 20.8 % — ABNORMAL HIGH (ref 11.5–15.5)
WBC: 4.6 10*3/uL (ref 4.0–10.5)
nRBC: 0 % (ref 0.0–0.2)

## 2022-12-10 LAB — MAGNESIUM: Magnesium: 1.7 mg/dL (ref 1.7–2.4)

## 2022-12-10 MED ORDER — TAMSULOSIN HCL 0.4 MG PO CAPS: 0.4000 mg | ORAL_CAPSULE | Freq: Every day | ORAL | Status: DC

## 2022-12-10 MED ORDER — MIDODRINE HCL 5 MG PO TABS
10.0000 mg | ORAL_TABLET | Freq: Three times a day (TID) | ORAL | Status: DC
  Administered 2022-12-10 – 2022-12-18 (×25): 10 mg via ORAL
  Filled 2022-12-10 (×25): qty 2

## 2022-12-10 MED ORDER — METOPROLOL SUCCINATE ER 25 MG PO TB24
25.0000 mg | ORAL_TABLET | Freq: Every day | ORAL | Status: DC
  Administered 2022-12-10 – 2022-12-11 (×2): 25 mg via ORAL
  Filled 2022-12-10 (×2): qty 1

## 2022-12-10 MED ORDER — METOPROLOL TARTRATE 25 MG PO TABS: 25.0000 mg | ORAL_TABLET | Freq: Two times a day (BID) | ORAL | Status: DC

## 2022-12-10 NOTE — Progress Notes (Signed)
PROGRESS NOTE  Anthony [name] ZOX:[id number] DOB: [birth year] DOA: 12/02/2022 PCP: Pcp, No   LOS: 5 days   Brief Narrative / Interim history: 67 year old male, homeless, chronic alcohol use, anxiety, depression comes to the hospital with confusion.  He was noted to have hallucinations.  Apparently he has not been taking his medications.  He was found to be in A-fib with RVR in the ED, and admitted to the hospital.  Cardiology as well as psychiatry were consulted  Subjective / 24h Interval events: Overall in better spirits today.  Denies any chest pain, denies any palpitations.  Assesement and Plan: Principal Problem:   Atrial fibrillation with RVR (HCC) Active Problems:   HTN (hypertension)   Paroxysmal atrial fibrillation (HCC)   Hypomagnesemia   Dyslipidemia   Macrocytic anemia   Alcohol abuse with alcohol-induced mood disorder (HCC)   Tobacco use disorder   Hyponatremia   Aortic atherosclerosis (HCC)   Abnormal LFTs   Stroke (cerebrum) (HCC)  Principal problem PAF with RVR -cardiology following, currently with rates still poorly controlled.  Currently on metoprolol and had to be placed on midodrine.  Further adjustments per cards team.  Continue anticoagulation with Xarelto  Active problems Acute metabolic encephalopathy, hallucinations -likely in the setting of EtOH use/withdrawals.  Psychiatry consulted and evaluated patient.  He was started on Remeron.  Currently resolved and he is back to baseline  Ongoing alcohol abuse with alcohol-induced mood disorder -patient quite irritable when this is being brought up, he complains of chronic pain that is not properly addressed as an outpatient.  He wants better pain control as an outpatient, PCP and pain management before we can even tell him that he needs to stop drinking.  Very adamant about that.  PCP referral made, he has an appointment with IM TS on 6/3.  Chronic pain -patient reports falling from a roof of a house a few years ago  and had L3-4 and 5 fractures.  He is also been complaining of chronic C-spine pain radiating into his shoulders.  Prior imaging showed C3-4 advanced cervical spine degeneration, including chronic spondylolisthesis.  One of his main goals is to get better management for his pain, and asking about injections/outpatient pain management.  Possibly to get a referral once he establishes care with his PCP next week  Abnormal LFTs -due to EtOH, improved  Incidental small CVA -noticed on the MRI on 5/16, which showed a punctate acute cortical/subcortical infarct in the left superior frontal gyrus without hemorrhage or mass effect.  Neurology consulted, underwent a CTA without LVO, 2D echo was not done due to being recently obtained couple of months ago, showing LVEF 60-65% without WMA.  RV was normal.  A1c was 4.9, LDL 48.  Currently on Xarelto, statin  Essential hypertension-on diltiazem  Hyperlipidemia-continue statin  Hypotension -improved after fluids but still orthostatic.  Discontinue Flomax.  Now on midodrine  Homelessness -lives at [facility]  Scheduled Meds:  acetaminophen  1,000 mg Oral TID   feeding supplement  237 mL Oral BID BM   ferrous sulfate  325 mg Oral Q breakfast   folic acid  1 mg Oral Daily   lidocaine  1 patch Transdermal Q24H   loratadine  10 mg Oral Daily   magnesium oxide  400 mg Oral BID   methocarbamol  500 mg Oral TID   metoprolol succinate  25 mg Oral Daily   midodrine  10 mg Oral TID WC   mirtazapine  7.5 mg Oral QHS   mometasone-formoterol  2 puff Inhalation BID   multivitamin with minerals  1 tablet Oral Daily   rivaroxaban  20 mg Oral Q supper   rosuvastatin  10 mg Oral Daily   Continuous Infusions:   PRN Meds:.acetaminophen **OR** acetaminophen, LORazepam, ondansetron **OR** ondansetron (ZOFRAN) IV, mouth rinse, polyvinyl alcohol, traMADol  Current Outpatient Medications  Medication Instructions   acetaminophen (TYLENOL) 500 mg, Oral, Every 8 hours PRN    BAYER BACK & BODY PAIN EX ST 500-32.5 MG TABS 1-2 tablets, Oral, Every 6 hours PRN   budesonide-formoterol (SYMBICORT) 160-4.5 MCG/ACT inhaler 2 puffs, Inhalation, 2 times daily   diltiazem (CARDIZEM SR) 60 mg, Oral, Every 12 hours   ferrous sulfate 325 mg, Oral, Daily with breakfast   folic acid (FOLVITE) 1 mg, Oral, Daily   furosemide (LASIX) 20 mg, Oral, Daily   Magnesium Oxide 400 mg, Oral, 2 times daily   methocarbamol (ROBAXIN) 500 mg, Oral, Every 6 hours PRN   metoprolol tartrate (LOPRESSOR) 25 MG tablet Take 1/2 tablet (12.5 mg total) by mouth 2 (two) times daily.   Multiple Vitamins-Minerals (CENTRUM SILVER 50+MEN) TABS 1 tablet, Oral, Every other day   Nutritional Supplements (ENSURE HIGH PROTEIN) LIQD 237 mLs, Oral, Daily   rivaroxaban (XARELTO) 20 MG TABS tablet Take 1 tablet (20 mg) by mouth daily with supper.   rosuvastatin (CRESTOR) 10 mg, Oral, Daily   tamsulosin (FLOMAX) 0.4 mg, Oral, Daily after supper   TYLENOL PM EXTRA STRENGTH 500-25 MG TABS tablet 2 tablets, Oral, At bedtime PRN    Diet Orders (From admission, onward)     Start     Ordered   12/03/22 1207  Diet regular Room service appropriate? Yes; Fluid consistency: Thin  Diet effective now       Question Answer Comment  Room service appropriate? Yes   Fluid consistency: Thin      12/03/22 1208            DVT prophylaxis: Place TED hose Start: 12/09/22 1316 rivaroxaban (XARELTO) tablet 20 mg   Lab Results  Component Value Date   PLT 206 12/10/2022      Code Status: Full Code  Family Communication: no family at bedside   Status is: Inpatient  Remains inpatient appropriate because: A fib RVR   Level of care: Progressive  Consultants:  Cardiology   Objective: Vitals:   12/09/22 2001 12/10/22 0428 12/10/22 0914 12/10/22 0915  BP: 115/75 122/88    Pulse: (!) 103 99    Resp: 18 16    Temp: 98 F (36.7 C) 98.3 F (36.8 C)    TempSrc: Oral Oral    SpO2: 100% 100% 100% 100%  Weight:       Height:        Intake/Output Summary (Last 24 hours) at 12/10/2022 1156 Last data filed at 12/10/2022 0457 Gross per 24 hour  Intake 1080 ml  Output 1600 ml  Net -520 ml    Wt Readings from Last 3 Encounters:  12/02/22 60.4 kg  10/10/22 66.7 kg  10/06/22 68 kg    Examination:  Constitutional: NAD Eyes: lids and conjunctivae normal, no scleral icterus ENMT: mmm Neck: normal, supple Respiratory: clear to auscultation bilaterally, no wheezing, no crackles. Normal respiratory effort.  Cardiovascular: Regular rate and rhythm, no murmurs / rubs / gallops. No LE edema. Abdomen: soft, no distention, no tenderness. Bowel sounds positive.  Skin: no rashes  Data Reviewed: I have independently reviewed following labs and imaging studies   CBC Recent Labs  Lab 12/04/22 0406 12/05/22 0857 12/08/22 0422 12/09/22 0434 12/10/22 0407  WBC 4.7 4.6 4.7 4.7 4.6  HGB 9.1* 10.0* 9.7* 10.3* 9.6*  HCT 28.6* 30.6* 29.9* 32.2* 30.8*  PLT 137* 155 137* 166 206  MCV 104.4* 103.4* 105.3* 105.6* 106.2*  MCH 33.2 33.8 34.2* 33.8 33.1  MCHC 31.8 32.7 32.4 32.0 31.2  RDW 20.8* 20.8* 20.5* 20.7* 20.8*     Recent Labs  Lab 12/04/22 0406 12/05/22 0857 12/06/22 0824 12/07/22 0852 12/08/22 0422 12/09/22 0434 12/10/22 0407  NA 135   < > 134* 137 136 138 138  K 4.9   < > 3.9 3.8 4.3 4.2 4.1  CL 105   < > 104 107 108 108 107  CO2 23   < > 21* 22 22 23 22   GLUCOSE 99   < > 133* 119* 92 92 78  BUN 21   < > 24* 21 23 20  27*  CREATININE 0.87   < > 0.82 0.74 0.83 0.86 0.71  CALCIUM 8.1*   < > 8.7* 8.9 9.1 8.9 9.1  AST  --   --   --   --   --   --  38  ALT  --   --   --   --   --   --  35  ALKPHOS  --   --   --   --   --   --  52  BILITOT  --   --   --   --   --   --  0.4  ALBUMIN  --   --   --   --   --   --  3.4*  MG 1.4*   < > 1.5* 1.8 1.9 1.6* 1.7  HGBA1C 4.9  --   --   --   --   --   --    < > = values in this interval not displayed.      ------------------------------------------------------------------------------------------------------------------ No results for input(s): "CHOL", "HDL", "LDLCALC", "TRIG", "CHOLHDL", "LDLDIRECT" in the last 72 hours.  Lab Results  Component Value Date   HGBA1C 4.9 12/04/2022   ------------------------------------------------------------------------------------------------------------------ No results for input(s): "TSH", "T4TOTAL", "T3FREE", "THYROIDAB" in the last 72 hours.  Invalid input(s): "FREET3"  Cardiac Enzymes No results for input(s): "CKMB", "TROPONINI", "MYOGLOBIN" in the last 168 hours.  Invalid input(s): "CK" ------------------------------------------------------------------------------------------------------------------    Component Value Date/Time   BNP 341.6 (H) 12/02/2022 0409    CBG: No results for input(s): "GLUCAP" in the last 168 hours.  No results found for this or any previous visit (from the past 240 hour(s)).   Radiology Studies: No results found.   Pamella Pert, MD, PhD [facility]  Between 7 am - 7 pm I am available, please contact me via Amion (for emergencies) or Securechat (non urgent messages)  Between 7 pm - 7 am I am not available, please contact night coverage MD/APP via Amion

## 2022-12-10 NOTE — Progress Notes (Addendum)
Rounding Note   Patient Name: Anthony [name] Date of Encounter: 12/10/2022  [facility] Cardiologist: Verne Carrow, MD   Subjective   Has shortness of breath with ambulation.  Occasionally feels palpitations but not often.  Denies any chest pain  Inpatient Medications    Scheduled Meds:  acetaminophen  1,000 mg Oral TID   diltiazem  30 mg Oral Q6H   feeding supplement  237 mL Oral BID BM   ferrous sulfate  325 mg Oral Q breakfast   folic acid  1 mg Oral Daily   lidocaine  1 patch Transdermal Q24H   loratadine  10 mg Oral Daily   magnesium oxide  400 mg Oral BID   methocarbamol  500 mg Oral TID   midodrine  5 mg Oral TID WC   mirtazapine  7.5 mg Oral QHS   mometasone-formoterol  2 puff Inhalation BID   multivitamin with minerals  1 tablet Oral Daily   rivaroxaban  20 mg Oral Q supper   rosuvastatin  10 mg Oral Daily   tamsulosin  0.4 mg Oral QPC supper   Continuous Infusions:  PRN Meds: acetaminophen **OR** acetaminophen, LORazepam, ondansetron **OR** ondansetron (ZOFRAN) IV, mouth rinse, polyvinyl alcohol, traMADol   Vital Signs    Vitals:   12/09/22 1356 12/09/22 1916 12/09/22 2001 12/10/22 0428  BP: 118/71  115/75 122/88  Pulse: (!) 103  (!) 103 99  Resp: 16  18 16   Temp: (!) 97.3 F (36.3 C)  98 F (36.7 C) 98.3 F (36.8 C)  TempSrc: Oral  Oral Oral  SpO2: 100% 99% 100% 100%  Weight:      Height:        Intake/Output Summary (Last 24 hours) at 12/10/2022 0851 Last data filed at 12/10/2022 0457 Gross per 24 hour  Intake 1080 ml  Output 1600 ml  Net -520 ml      12/02/2022    8:53 AM 10/10/2022    9:00 AM 10/10/2022    2:00 AM  Last 3 Weights  Weight (lbs) 133 lb 2.5 oz 147 lb 0.8 oz 149 lb 14.6 oz  Weight (kg) 60.4 kg 66.7 kg 68 kg      Telemetry    Atrial fibrillation and some presence of atrial flutter.  Uncontrolled rates between 100 140s, jumps to 150s to 170s occasionally.- Personally Reviewed  ECG    No new tracings-  Personally Reviewed  Physical Exam   GEN: Disheveled  Neck: No JVD Cardiac: Tachycardic irregularly irregular.  No murmurs.   Respiratory: Clear to auscultation bilaterally. GI: Soft, nontender, non-distended  MS: No edema; No deformity. Neuro:  Nonfocal  Psych: Normal affect   Labs    High Sensitivity Troponin:   Recent Labs  Lab 12/02/22 0409 12/02/22 0858  TROPONINIHS 11 10     Chemistry Recent Labs  Lab 12/08/22 0422 12/09/22 0434 12/10/22 0407  NA 136 138 138  K 4.3 4.2 4.1  CL 108 108 107  CO2 22 23 22   GLUCOSE 92 92 78  BUN 23 20 27*  CREATININE 0.83 0.86 0.71  CALCIUM 9.1 8.9 9.1  MG 1.9 1.6* 1.7  PROT  --   --  7.2  ALBUMIN  --   --  3.4*  AST  --   --  38  ALT  --   --  35  ALKPHOS  --   --  52  BILITOT  --   --  0.4  GFRNONAA >60 >60 >60  ANIONGAP 6 7 9     Lipids  Recent Labs  Lab 12/04/22 0406  CHOL 121  TRIG 63  HDL 60  LDLCALC 48  CHOLHDL 2.0    Hematology Recent Labs  Lab 12/08/22 0422 12/09/22 0434 12/10/22 0407  WBC 4.7 4.7 4.6  RBC 2.84* 3.05* 2.90*  HGB 9.7* 10.3* 9.6*  HCT 29.9* 32.2* 30.8*  MCV 105.3* 105.6* 106.2*  MCH 34.2* 33.8 33.1  MCHC 32.4 32.0 31.2  RDW 20.5* 20.7* 20.8*  PLT 137* 166 206   Thyroid No results for input(s): "TSH", "FREET4" in the last 168 hours.  BNPNo results for input(s): "BNP", "PROBNP" in the last 168 hours.  DDimer No results for input(s): "DDIMER" in the last 168 hours.   Radiology    No results found.  Cardiac Studies   Coronary CTA 10/03/2020: Impressions: 1. Coronary calcium score of 884. This was 91st percentile for age, sex, and race matched control. 2. Normal coronary origin.  Right dominance. 3. CAD-RADS 3. Moderate stenosis. Consider symptom-guided anti-ischemic pharmacotherapy as well as risk factor modification per guideline directed care. Additional analysis with CT FFR will be Submitted. 4. 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   FFR Summary: 1. CT FFR analysis shows no evidence of significant functional stenosis. Recommend aggressive risk factor management for non-obstructive calcifications. _______________   Echocardiogram 08/28/2022: Impressions: 1. Left ventricular ejection fraction, by estimation, is 60 to 65%. The  left ventricle has normal function. The left ventricle has no regional  wall motion abnormalities. Left ventricular diastolic parameters are  indeterminate.   2. Right ventricular systolic function is normal. The right ventricular  size is mildly enlarged. Tricuspid regurgitation signal is inadequate for  assessing PA pressure.   3. The mitral valve is normal in structure. No evidence of mitral valve  regurgitation. No evidence of mitral stenosis.   4. The aortic valve was not well visualized. Aortic valve regurgitation  is not visualized. No aortic stenosis is present.   Patient Profile     67 y.o. male with a history of moderate non-obstructive CAD noted on coronary CTA in 09/2020, paroxysmal atrial fibrillation on Xarelto, chronic diastolic CHF, PE in 2018, hypertension complicated by orthostatic hypotension, surgically corrected hydrocephalus, chronic back pain with a lumbar compression fracture, pancytopenia, alcohol abuse with prior withdrawal, tobacco abuse, homelessness, and medication non-compliance. Patient has had numerous prior ED visits and admission for various medical issues including sepsis, aspiration pneumonia, traumatic pneumothorax, pressure ulcer, and C.Diff.  He presented this admission on 12/02/2022 via [facility] for hallucinations.  He was found to have an incidental small stroke. Cardiology was consulted on 12/04/2022 for further evaluation of recurrent atrial fibrillation with RVR and hypotension at the request of Dr. Sunnie Nielsen.   Assessment & Plan    Paroxysmal atrial fibrillation with RVR  Patient with frequent readmissions and has been nonadherent with  medication or follow-up due to socioeconomic barriers.  Rate control strategies have been difficult due to symptomatic orthostatic hypotension.  Heart rates uncontrolled and generally between 100-140 and occasional spikes to 170.  Has been on midodrine and diltiazem drip. His treatment plan has been very difficult due to multiple concerns for (ETOH use, noncompliance, homelessness, etc)  Diltiazem has been attempted as it has been previously successful however during this admission has not effectively helped his rates and has caused more issues of orthostatic hypotension. In addition, I suspect that he also has had poor PO intake and  appears to be slightly dehydrated today with an elevated BUN, which is exacerbating his current condition. Will re perform orthostatic vital signs today to help guide possible gentle fluid resuscitation and try Toprol XL 25mg  again as patient had previously better controlled rates on it and with less complaints of orthostasis. Can also consider amio, however given alcohol abuse and noncompliance with DOAC reluctant to use. Not a candidate for cardioversion due to noncompliance with DOAC. Will start Toprol 25 XL daily and repeat orthostatics.  Potentially would like to give fluids for possible dehydration Continue Xarelto 20 mg daily  Orthostatic hypotension Repeating orthostatic vital signs today. Hoping this will help guide plans for possible fluid resuscitation, need to be careful given HF. Additionally, patient on a lot of medications (psych and pain meds) that are likely contributing to his symptoms. Would be judicious with there use.  Needs compression stocks placed and abdominal binder if patient will allow.  Increasing midodrine 10 mg 3 times daily, hopefully can wean off.   Chronic HFpEF Recent echocardiogram.  February 2024 showed LVEF of 60 to 65% with no regional wall motion abnormalities mildly enlarged RV and normal RV function.  Overall euvolemic today. BNP 34.  GDMT limited by orthostatic hypotension.  Deferring addition of new medications for blood pressure room for rate control.   Moderate nonobstructive CAD Noted on coronary CTA in March 2022.  No aspirin given DOAC.  Continue rosuvastatin 10 mg.  Otherwise, per primary team: - Stroke  - Hypomagnesemia - Chronic anemia - Hallucinations  - Alcohol use disorder - Homelessness  For questions or updates, please contact [facility] Please consult [email or web address] for contact info under  Signed, Abagail Kitchens, PA-C  12/10/2022, 8:51 AM     Patient seen and examined, note reviewed with the signed Advanced Practice Provider. I personally reviewed laboratory data, imaging studies and relevant notes. I independently examined the patient and formulated the important aspects of the plan. I have personally discussed the plan with the patient and/or family. Comments or changes to the note/plan are indicated below.  Patient seen and examined at his bedside.   Paroxysmal atrial fibrillation CVA History of chronic diastolic heart failure-clinically appears to be euvolemic Moderate CAD Chronic alcohol use disorder with hallucination Homelessness-May need to have social work evaluation Electrolyte abnormalities     Heart rate still has not yet controlled and he is not tolerating the Cardizem so we will have to go back to the     Continue Xarelto has been started for the stroke prevention.   He has no anginal symptoms.  Not on aspirin due to currently being on Xarelto.   He will benefit from evaluation for care navigation/social worker.  Keep mag above 2 and K above 4   Thomasene Ripple DO, MS [facility] Attending Cardiologist [facility]  [address] [place], [place] [address] [phone] Website: [email or web address]

## 2022-12-10 NOTE — Progress Notes (Signed)
Nutrition Follow-up  DOCUMENTATION CODES:   Non-severe (moderate) malnutrition in context of chronic illness  INTERVENTION:  - Regular diet.  - Ensure Plus High Protein po BID, each supplement provides 350 kcal and 20 grams of protein. - Continue Multivitamin with minerals and folic acid daily for alcohol abuse.  - Monitor weight trends.    NUTRITION DIAGNOSIS:   Moderate Malnutrition related to chronic illness (etoh abuse) as evidenced by moderate muscle depletion, moderate fat depletion, percent weight loss (10.7% in less than 2 months). *ongoing  GOAL:   Patient will meet greater than or equal to 90% of their needs *met  MONITOR:   PO intake, Supplement acceptance, Weight trends  REASON FOR ASSESSMENT:   Malnutrition Screening Tool    ASSESSMENT:   67 year old with PMH significant for alcohol abuse who was admitted for A-fib.  Patient endorses great appetite and eating 3 meals a day. Notes he often orders enough on his trays to be able to snack throughout the day. Documented to be consuming 100% of meals. He has a desire to gain weight.  Drinking Ensure daily, enjoys and plans to continue to consume.  Encouraged patient to continue to eat well and consume supplements.     Medications reviewed and include: 325mg  ferrous sulfate, folic acid, Magnesium BID, MVI, Remeron  Labs reviewed:  -   Diet Order:   Diet Order             Diet regular Room service appropriate? Yes; Fluid consistency: Thin  Diet effective now                   EDUCATION NEEDS:  Education needs have been addressed  Skin:  Skin Assessment: Reviewed RN Assessment  Last BM:  5/21  Height:  Ht Readings from Last 1 Encounters:  12/02/22 5\' 9"  (1.753 m)   Weight:  Wt Readings from Last 1 Encounters:  12/02/22 60.4 kg    BMI:  Body mass index is 19.66 kg/m.  Estimated Nutritional Needs:  Kcal:  1800-2100 kcals Protein:  85-100 grams Fluid:  >/= 1.8L    Shelle Iron RD, LDN For contact information, refer to [facility].

## 2022-12-11 ENCOUNTER — Encounter (HOSPITAL_COMMUNITY): Payer: Self-pay

## 2022-12-11 DIAGNOSIS — I4891 Unspecified atrial fibrillation: Secondary | ICD-10-CM | POA: Diagnosis not present

## 2022-12-11 LAB — CBC
HCT: 28.6 % — ABNORMAL LOW (ref 39.0–52.0)
Hemoglobin: 9.1 g/dL — ABNORMAL LOW (ref 13.0–17.0)
MCH: 34.1 pg — ABNORMAL HIGH (ref 26.0–34.0)
MCHC: 31.8 g/dL (ref 30.0–36.0)
MCV: 107.1 fL — ABNORMAL HIGH (ref 80.0–100.0)
Platelets: 223 10*3/uL (ref 150–400)
RBC: 2.67 MIL/uL — ABNORMAL LOW (ref 4.22–5.81)
RDW: 21.1 % — ABNORMAL HIGH (ref 11.5–15.5)
WBC: 5.4 10*3/uL (ref 4.0–10.5)
nRBC: 0 % (ref 0.0–0.2)

## 2022-12-11 LAB — BASIC METABOLIC PANEL
Anion gap: 7 (ref 5–15)
BUN: 29 mg/dL — ABNORMAL HIGH (ref 8–23)
CO2: 22 mmol/L (ref 22–32)
Calcium: 8.8 mg/dL — ABNORMAL LOW (ref 8.9–10.3)
Chloride: 107 mmol/L (ref 98–111)
Creatinine, Ser: 0.72 mg/dL (ref 0.61–1.24)
GFR, Estimated: >60 mL/min (ref >60)
Glucose, Bld: 90 mg/dL (ref 70–99)
Potassium: 4.2 mmol/L (ref 3.5–5.1)
Sodium: 136 mmol/L (ref 135–145)

## 2022-12-11 LAB — MAGNESIUM: Magnesium: 1.5 mg/dL — ABNORMAL LOW (ref 1.7–2.4)

## 2022-12-11 MED ORDER — MAGNESIUM SULFATE 2 GM/50ML IV SOLN
2.0000 g | Freq: Once | INTRAVENOUS | Status: AC
  Administered 2022-12-11: 2 g via INTRAVENOUS
  Filled 2022-12-11: qty 50

## 2022-12-11 MED ORDER — SODIUM CHLORIDE 0.9 % IV BOLUS
250.0000 mL | Freq: Once | INTRAVENOUS | Status: AC
  Administered 2022-12-11: 250 mL via INTRAVENOUS

## 2022-12-11 MED ORDER — METOPROLOL SUCCINATE ER 25 MG PO TB24
25.0000 mg | ORAL_TABLET | Freq: Two times a day (BID) | ORAL | Status: DC
  Administered 2022-12-11 – 2022-12-18 (×14): 25 mg via ORAL
  Filled 2022-12-11 (×14): qty 1

## 2022-12-11 MED ORDER — MAGNESIUM SULFATE 4 GM/100ML IV SOLN: 4.0000 g | Freq: Once | INTRAVENOUS | Status: DC

## 2022-12-11 MED ORDER — METOPROLOL TARTRATE 5 MG/5ML IV SOLN
2.5000 mg | Freq: Once | INTRAVENOUS | Status: AC
  Administered 2022-12-11: 2.5 mg via INTRAVENOUS
  Filled 2022-12-11: qty 5

## 2022-12-11 MED ORDER — METOPROLOL TARTRATE 5 MG/5ML IV SOLN
5.0000 mg | Freq: Once | INTRAVENOUS | Status: AC
  Administered 2022-12-11: 5 mg via INTRAVENOUS
  Filled 2022-12-11: qty 5

## 2022-12-11 MED ORDER — METOPROLOL TARTRATE 5 MG/5ML IV SOLN
2.5000 mg | Freq: Once | INTRAVENOUS | Status: AC
  Administered 2022-12-11: 2.5 mg via INTRAVENOUS

## 2022-12-11 NOTE — Progress Notes (Signed)
PROGRESS NOTE  Anthony [name] ZOX:[id number] DOB: August 05, 1955 DOA: 12/02/2022 PCP: Pcp, No   LOS: 6 days   Brief Narrative / Interim history: 67 year old male, homeless, chronic alcohol use, anxiety, depression comes to the hospital with confusion.  He was noted to have hallucinations.  Apparently he has not been taking his medications.  He was found to be in A-fib with RVR in the ED, and admitted to the hospital.  Cardiology as well as psychiatry were consulted  Subjective / 24h Interval events: No chest pain, no shortness of breath.  Ambulating in the hallway.  Assesement and Plan: Principal Problem:   Atrial fibrillation with RVR (HCC) Active Problems:   HTN (hypertension)   Paroxysmal atrial fibrillation (HCC)   Hypomagnesemia   Dyslipidemia   Macrocytic anemia   Alcohol abuse with alcohol-induced mood disorder (HCC)   Tobacco use disorder   Hyponatremia   Aortic atherosclerosis (HCC)   Abnormal LFTs   Stroke (cerebrum) (HCC)  Principal problem PAF with RVR -cardiology following, currently with rates still poorly controlled.  Currently on metoprolol, increased dose today due to poorly controlled rates, and had to be placed on midodrine.  Further adjustments per cards team.  Continue anticoagulation with Xarelto  Active problems Acute metabolic encephalopathy, hallucinations -likely in the setting of EtOH use/withdrawals.  Psychiatry consulted and evaluated patient.  He was started on Remeron.  Currently resolved and he is back to baseline  Ongoing alcohol abuse with alcohol-induced mood disorder -patient quite irritable when this is being brought up, he complains of chronic pain that is not properly addressed as an outpatient.  He wants better pain control as an outpatient, PCP and pain management before we can even tell him that he needs to stop drinking.  Very adamant about that.  PCP referral made, he has an appointment with IM TS on 6/3.  Chronic pain -patient reports falling  from a roof of a house a few years ago and had L3-4 and 5 fractures.  He is also been complaining of chronic C-spine pain radiating into his shoulders.  Prior imaging showed C3-4 advanced cervical spine degeneration, including chronic spondylolisthesis.  One of his main goals is to get better management for his pain, and asking about injections/outpatient pain management.  Possibly to get a referral once he establishes care with his PCP next week  Abnormal LFTs -due to EtOH, improved  Incidental small CVA -noticed on the MRI on 5/16, which showed a punctate acute cortical/subcortical infarct in the left superior frontal gyrus without hemorrhage or mass effect.  Neurology consulted, underwent a CTA without LVO, 2D echo was not done due to being recently obtained couple of months ago, showing LVEF 60-65% without WMA.  RV was normal.  A1c was 4.9, LDL 48.  Currently on Xarelto, statin  Essential hypertension-on diltiazem  Hyperlipidemia-continue statin  BPH -has been having some symptoms, Flomax is on hold due to orthostatic hypotension.  Will need outpatient follow-up with urology  Hypotension -improved after fluids but still orthostatic.  Discontinue Flomax.  Now on midodrine  Homelessness -[facility] at [facility]  Hypomagnesemia-continue to replace magnesium this morning  Scheduled Meds:  acetaminophen  1,000 mg Oral TID   feeding supplement  237 mL Oral BID BM   ferrous sulfate  325 mg Oral Q breakfast   folic acid  1 mg Oral Daily   lidocaine  1 patch Transdermal Q24H   loratadine  10 mg Oral Daily   magnesium oxide  400 mg Oral BID  methocarbamol  500 mg Oral TID   metoprolol succinate  25 mg Oral BID   midodrine  10 mg Oral TID WC   mirtazapine  7.5 mg Oral QHS   mometasone-formoterol  2 puff Inhalation BID   multivitamin with minerals  1 tablet Oral Daily   rivaroxaban  20 mg Oral Q supper   rosuvastatin  10 mg Oral Daily   Continuous Infusions:  magnesium sulfate bolus IVPB       PRN Meds:.acetaminophen **OR** acetaminophen, LORazepam, ondansetron **OR** ondansetron (ZOFRAN) IV, mouth rinse, polyvinyl alcohol, traMADol  Current Outpatient Medications  Medication Instructions   acetaminophen (TYLENOL) 500 mg, Oral, Every 8 hours PRN   BAYER BACK & BODY PAIN EX ST 500-32.5 MG TABS 1-2 tablets, Oral, Every 6 hours PRN   budesonide-formoterol (SYMBICORT) 160-4.5 MCG/ACT inhaler 2 puffs, Inhalation, 2 times daily   diltiazem (CARDIZEM SR) 60 mg, Oral, Every 12 hours   ferrous sulfate 325 mg, Oral, Daily with breakfast   folic acid (FOLVITE) 1 mg, Oral, Daily   furosemide (LASIX) 20 mg, Oral, Daily   Magnesium Oxide 400 mg, Oral, 2 times daily   methocarbamol (ROBAXIN) 500 mg, Oral, Every 6 hours PRN   metoprolol tartrate (LOPRESSOR) 25 MG tablet Take 1/2 tablet (12.5 mg total) by mouth 2 (two) times daily.   Multiple Vitamins-Minerals (CENTRUM SILVER 50+MEN) TABS 1 tablet, Oral, Every other day   Nutritional Supplements (ENSURE HIGH PROTEIN) LIQD 237 mLs, Oral, Daily   rivaroxaban (XARELTO) 20 MG TABS tablet Take 1 tablet (20 mg) by mouth daily with supper.   rosuvastatin (CRESTOR) 10 mg, Oral, Daily   tamsulosin (FLOMAX) 0.4 mg, Oral, Daily after supper   TYLENOL PM EXTRA STRENGTH 500-25 MG TABS tablet 2 tablets, Oral, At bedtime PRN    Diet Orders (From admission, onward)     Start     Ordered   12/03/22 1207  Diet regular Room service appropriate? Yes; Fluid consistency: Thin  Diet effective now       Question Answer Comment  Room service appropriate? Yes   Fluid consistency: Thin      12/03/22 1208            DVT prophylaxis: Place TED hose Start: 12/09/22 1316 rivaroxaban (XARELTO) tablet 20 mg   Lab Results  Component Value Date   PLT 223 12/11/2022      Code Status: Full Code  Family Communication: no family at bedside   Status is: Inpatient  Remains inpatient appropriate because: A fib RVR   Level of care:  Telemetry  Consultants:  Cardiology   Objective: Vitals:   12/11/22 0140 12/11/22 0435 12/11/22 0914 12/11/22 0935  BP: 123/88 (!) 122/95  127/89  Pulse: (!) 137 (!) 142  (!) 134  Resp: 18 18    Temp: 98.1 F (36.7 C) 98 F (36.7 C)  97.7 F (36.5 C)  TempSrc: Oral Oral  Oral  SpO2: 98% 100% 99%   Weight:      Height:        Intake/Output Summary (Last 24 hours) at 12/11/2022 1132 Last data filed at 12/11/2022 0530 Gross per 24 hour  Intake 1030 ml  Output 1450 ml  Net -420 ml    Wt Readings from Last 3 Encounters:  12/02/22 60.4 kg  10/10/22 66.7 kg  10/06/22 68 kg    Examination:  Constitutional: NAD Eyes: lids and conjunctivae normal, no scleral icterus ENMT: mmm Neck: normal, supple Respiratory: clear to auscultation bilaterally, no wheezing, no  crackles. Normal respiratory effort.  Cardiovascular: Irregular, tachycardic Abdomen: soft, no distention, no tenderness. Bowel sounds positive.   Data Reviewed: I have independently reviewed following labs and imaging studies   CBC Recent Labs  Lab 12/05/22 0857 12/08/22 0422 12/09/22 0434 12/10/22 0407 12/11/22 0422  WBC 4.6 4.7 4.7 4.6 5.4  HGB 10.0* 9.7* 10.3* 9.6* 9.1*  HCT 30.6* 29.9* 32.2* 30.8* 28.6*  PLT 155 137* 166 206 223  MCV 103.4* 105.3* 105.6* 106.2* 107.1*  MCH 33.8 34.2* 33.8 33.1 34.1*  MCHC 32.7 32.4 32.0 31.2 31.8  RDW 20.8* 20.5* 20.7* 20.8* 21.1*     Recent Labs  Lab 12/07/22 0852 12/08/22 0422 12/09/22 0434 12/10/22 0407 12/11/22 0422  NA 137 136 138 138 136  K 3.8 4.3 4.2 4.1 4.2  CL 107 108 108 107 107  CO2 22 22 23 22 22   GLUCOSE 119* 92 92 78 90  BUN 21 23 20  27* 29*  CREATININE 0.74 0.83 0.86 0.71 0.72  CALCIUM 8.9 9.1 8.9 9.1 8.8*  AST  --   --   --  38  --   ALT  --   --   --  35  --   ALKPHOS  --   --   --  52  --   BILITOT  --   --   --  0.4  --   ALBUMIN  --   --   --  3.4*  --   MG 1.8 1.9 1.6* 1.7 1.5*      ------------------------------------------------------------------------------------------------------------------ No results for input(s): "CHOL", "HDL", "LDLCALC", "TRIG", "CHOLHDL", "LDLDIRECT" in the last 72 hours.  Lab Results  Component Value Date   HGBA1C 4.9 12/04/2022   ------------------------------------------------------------------------------------------------------------------ No results for input(s): "TSH", "T4TOTAL", "T3FREE", "THYROIDAB" in the last 72 hours.  Invalid input(s): "FREET3"  Cardiac Enzymes No results for input(s): "CKMB", "TROPONINI", "MYOGLOBIN" in the last 168 hours.  Invalid input(s): "CK" ------------------------------------------------------------------------------------------------------------------    Component Value Date/Time   BNP 341.6 (H) 12/02/2022 0409    CBG: No results for input(s): "GLUCAP" in the last 168 hours.  No results found for this or any previous visit (from the past 240 hour(s)).   Radiology Studies: No results found.   Pamella Pert, MD, PhD [facility]  Between 7 am - 7 pm I am available, please contact me via Amion (for emergencies) or Securechat (non urgent messages)  Between 7 pm - 7 am I am not available, please contact night coverage MD/APP via Amion
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
## 2022-12-11 NOTE — Progress Notes (Signed)
    Patient Name: Anthony [name]           DOB: 1956-04-08  MRN: [id number]      Admission Date: 12/02/2022  Attending Provider: Leatha Gilding, MD  Primary Diagnosis: Atrial fibrillation with RVR (HCC)   Level of care: Telemetry    CROSS COVER NOTE   Date of Service   12/11/2022   Anthony [name], 67 y.o. male, was admitted on 12/02/2022 for Atrial fibrillation with RVR (HCC).    HPI/Events of Note   0100- HR 100-140's.  Patient in sinus tachycardia per telemetry. Patient is currently on metoprolol 25 XL daily for A-fib. Order placed for 2.5 mg IV Lopressor x 2 as well as a 250 cc fluid bolus.   0400-after Lopressor and fluids, HR 120-130s.  EKG confirms sinus tachycardia.   Additional fluid bolus and 5 mg Lopressor ordered.   Labs to be collected now to assess electrolyte and magnesium levels.   RN has been asked to follow-up with cardiology if HR> 120 after interventions.   Interventions/ Plan   Above        Anthony Harada, DNP, [facility] [place]

## 2022-12-11 NOTE — Care Management Important Message (Signed)
Important Message  Patient Details IM Letter given Name: Anthony [name] MRN: [id number] Date of Birth: March 27, 1956   Medicare Important Message Given:  Yes     Caren Macadam 12/11/2022, 2:50 PM

## 2022-12-11 NOTE — TOC Progression Note (Deleted)
Transition of Care [facility]) - Progression Note    Patient Details  Name: Anthony [name] MRN: [id number] Date of Birth: 09-13-1955  [facility] CM/SW Contact  Howell Rucks, RN Phone Number: 12/11/2022, 11:07 AM  Clinical Narrative:   PT- no recommendation, family reports pt has DME needed.     Expected Discharge Plan: Homeless Shelter Barriers to Discharge: Continued Medical Work up  Expected Discharge Plan and Services   Discharge Planning Services: CM Consult   Living arrangements for the past 2 months: Group Home                                       Social Determinants of Health (SDOH) Interventions SDOH Screenings   Food Insecurity: Food Insecurity Present (12/11/2022)  Housing: High Risk (12/02/2022)  Transportation Needs: Unmet Transportation Needs (12/11/2022)  Utilities: Not At Risk (12/11/2022)  Recent Concern: Utilities - At Risk (12/02/2022)  Depression (PHQ2-9): Medium Risk (10/16/2020)  Tobacco Use: High Risk (12/11/2022)    Readmission Risk Interventions    10/12/2022    1:29 PM 08/28/2022    2:00 PM 08/27/2022   10:30 AM  Readmission Risk Prevention Plan  Transportation Screening Complete Complete Complete  Medication Review Oceanographer) Complete  Complete  PCP or Specialist appointment within 3-5 days of discharge Complete Complete Complete  HRI or Home Care Consult Complete  Complete  SW Recovery Care/Counseling Consult Complete Complete Complete  Palliative Care Screening Not Applicable Not Applicable Not Applicable  Skilled Nursing Facility Not Applicable Not Applicable Not Applicable

## 2022-12-12 DIAGNOSIS — I483 Typical atrial flutter: Secondary | ICD-10-CM

## 2022-12-12 LAB — CBC
HCT: 31 % — ABNORMAL LOW (ref 39.0–52.0)
Hemoglobin: 9.6 g/dL — ABNORMAL LOW (ref 13.0–17.0)
MCH: 33.7 pg (ref 26.0–34.0)
MCHC: 31 g/dL (ref 30.0–36.0)
MCV: 108.8 fL — ABNORMAL HIGH (ref 80.0–100.0)
Platelets: 269 10*3/uL (ref 150–400)
RBC: 2.85 MIL/uL — ABNORMAL LOW (ref 4.22–5.81)
RDW: 20.8 % — ABNORMAL HIGH (ref 11.5–15.5)
WBC: 4.7 10*3/uL (ref 4.0–10.5)
nRBC: 0 % (ref 0.0–0.2)

## 2022-12-12 LAB — BASIC METABOLIC PANEL
Anion gap: 8 (ref 5–15)
BUN: 23 mg/dL (ref 8–23)
CO2: 20 mmol/L — ABNORMAL LOW (ref 22–32)
Calcium: 8.6 mg/dL — ABNORMAL LOW (ref 8.9–10.3)
Chloride: 109 mmol/L (ref 98–111)
Creatinine, Ser: 0.75 mg/dL (ref 0.61–1.24)
GFR, Estimated: >60 mL/min (ref >60)
Glucose, Bld: 90 mg/dL (ref 70–99)
Potassium: 4.7 mmol/L (ref 3.5–5.1)
Sodium: 137 mmol/L (ref 135–145)

## 2022-12-12 LAB — MAGNESIUM: Magnesium: 1.8 mg/dL (ref 1.7–2.4)

## 2022-12-12 MED ORDER — SIMETHICONE 80 MG PO CHEW
80.0000 mg | CHEWABLE_TABLET | Freq: Four times a day (QID) | ORAL | Status: DC | PRN
  Administered 2022-12-12 – 2022-12-15 (×5): 80 mg via ORAL
  Filled 2022-12-12 (×5): qty 1

## 2022-12-12 MED ORDER — DILTIAZEM HCL 30 MG PO TABS
30.0000 mg | ORAL_TABLET | Freq: Four times a day (QID) | ORAL | Status: DC
  Administered 2022-12-12 – 2022-12-14 (×10): 30 mg via ORAL
  Filled 2022-12-12 (×10): qty 1

## 2022-12-12 NOTE — Progress Notes (Signed)
Rounding Note    Patient Name: Anthony [name] Date of Encounter: 12/12/2022  [facility] Cardiologist: Verne Carrow, MD   Subjective   No CP or dyspnea  Inpatient Medications    Scheduled Meds:  acetaminophen  1,000 mg Oral TID   feeding supplement  237 mL Oral BID BM   ferrous sulfate  325 mg Oral Q breakfast   folic acid  1 mg Oral Daily   lidocaine  1 patch Transdermal Q24H   loratadine  10 mg Oral Daily   magnesium oxide  400 mg Oral BID   methocarbamol  500 mg Oral TID   metoprolol succinate  25 mg Oral BID   midodrine  10 mg Oral TID WC   mirtazapine  7.5 mg Oral QHS   mometasone-formoterol  2 puff Inhalation BID   multivitamin with minerals  1 tablet Oral Daily   rivaroxaban  20 mg Oral Q supper   rosuvastatin  10 mg Oral Daily   Continuous Infusions:  PRN Meds: acetaminophen **OR** acetaminophen, LORazepam, ondansetron **OR** ondansetron (ZOFRAN) IV, mouth rinse, polyvinyl alcohol, traMADol   Vital Signs    Vitals:   12/11/22 0935 12/11/22 2007 12/12/22 0100 12/12/22 0620  BP: 127/89 103/72 111/74 (!) 124/94  Pulse: (!) 134 (!) 145 (!) 139   Resp:  19 18 18   Temp: 97.7 F (36.5 C) 98 F (36.7 C) 98 F (36.7 C) 97.9 F (36.6 C)  TempSrc: Oral  Oral Oral  SpO2:  98% 99% 99%  Weight:      Height:        Intake/Output Summary (Last 24 hours) at 12/12/2022 0659 Last data filed at 12/12/2022 1610 Gross per 24 hour  Intake 1320 ml  Output 725 ml  Net 595 ml      12/02/2022    8:53 AM 10/10/2022    9:00 AM 10/10/2022    2:00 AM  Last 3 Weights  Weight (lbs) 133 lb 2.5 oz 147 lb 0.8 oz 149 lb 14.6 oz  Weight (kg) 60.4 kg 66.7 kg 68 kg      Telemetry    Atrial flutter with RVR - Personally Reviewed   Physical Exam   GEN: No acute distress.   Neck: No JVD Cardiac: tachycardic Respiratory: Clear to auscultation bilaterally. GI: Soft, nontender, non-distended  MS: No edema; No deformity. Neuro:  Nonfocal  Psych: Normal  affect   Labs    High Sensitivity Troponin:   Recent Labs  Lab 12/02/22 0409 12/02/22 0858  TROPONINIHS 11 10     Chemistry Recent Labs  Lab 12/10/22 0407 12/11/22 0422 12/12/22 0445  NA 138 136 137  K 4.1 4.2 4.7  CL 107 107 109  CO2 22 22 20*  GLUCOSE 78 90 90  BUN 27* 29* 23  CREATININE 0.71 0.72 0.75  CALCIUM 9.1 8.8* 8.6*  MG 1.7 1.5* 1.8  PROT 7.2  --   --   ALBUMIN 3.4*  --   --   AST 38  --   --   ALT 35  --   --   ALKPHOS 52  --   --   BILITOT 0.4  --   --   GFRNONAA >60 >60 >60  ANIONGAP 9 7 8    Hematology Recent Labs  Lab 12/10/22 0407 12/11/22 0422 12/12/22 0445  WBC 4.6 5.4 4.7  RBC 2.90* 2.67* 2.85*  HGB 9.6* 9.1* 9.6*  HCT 30.8* 28.6* 31.0*  MCV 106.2* 107.1* 108.8*  MCH  33.1 34.1* 33.7  MCHC 31.2 31.8 31.0  RDW 20.8* 21.1* 20.8*  PLT 206 223 269     Patient Profile     67 y.o. 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 Has had numerous prior admissions/ER visits for various medical issues including sepsis, aspiration PNA, traumatic pneumothorax, pressure ulcer, C Diff, delirium. Patient presented this admission via [facility] for hallucinations. Found to have recurrent AF RVR, incidental small stroke, elevated LFTs, hyponatremia, hypomagnesemia, macrocytic anemia, hypotension.  Echocardiogram February 2024 showed normal LV function.  Assessment & Plan    1 persistent atrial flutter-extremely difficult situation.  Patient's heart rate remains in the 140-150 range.  He is on Toprol but has had hypotension with the addition of Cardizem previously.  Now on midodrine.  Will retry low-dose Cardizem at 30 mg every 6 hours.  I am  hesitant to add amiodarone for rate control due to the risk of conversion (recent MRI showed acute CVA).  I think our only option is to proceed with TEE guided cardioversion.  I share Dr. Barnetta Hammersmith concern about patient reliably taking medications following discharge.  However at this point I do not think there are alternatives.  Continue Xarelto.  I will hopefully arrange TEE guided cardioversion next week.  I discussed this in detail with patient today.  I explained that he must take his Xarelto for at least 4 weeks following cardioversion.  I explained that there is a higher risk of CVA if he does not.  He understands but social situation is very difficult.  Will need assistance from case management for these issues.  2 recent CVA-noted on MRI.  Continue Xarelto.  3 history of coronary artery disease-continue statin.  4 macrocytic anemia-Per primary care.  For questions or updates, please contact [facility] Please consult [email or web address] for contact info under        Signed, Olga Millers, MD  12/12/2022, 6:59 AM

## 2022-12-12 NOTE — Progress Notes (Signed)
PROGRESS NOTE  Anthony [name] ZOX:[id number] DOB: [birth year] DOA: 12/02/2022 PCP: Pcp, No   LOS: 7 days   Brief Narrative / Interim history: 67 year old male, homeless, chronic alcohol use, anxiety, depression comes to the hospital with confusion.  He was noted to have hallucinations.  Apparently he has not been taking his medications.  He was found to be in A-fib with RVR in the ED, and admitted to the hospital.  Cardiology as well as psychiatry were consulted  Subjective / 24h Interval events: Feeling well, no chest pain, no palpitations.  Denies any shortness of breath.  Assesement and Plan: Principal Problem:   Atrial fibrillation with RVR (HCC) Active Problems:   HTN (hypertension)   Paroxysmal atrial fibrillation (HCC)   Hypomagnesemia   Dyslipidemia   Macrocytic anemia   Alcohol abuse with alcohol-induced mood disorder (HCC)   Tobacco use disorder   Hyponatremia   Aortic atherosclerosis (HCC)   Abnormal LFTs   Stroke (cerebrum) (HCC)  Principal problem PAF with RVR -cardiology following, currently with rates still poorly controlled.  Currently on metoprolol, increased dose today due to poorly controlled rates, and had to be placed on midodrine.  Further adjustments per cards team.  Continue anticoagulation with Xarelto -Looks like he will need to be cardioverted next week.  Active problems Acute metabolic encephalopathy, hallucinations -likely in the setting of EtOH use/withdrawals.  Psychiatry consulted and evaluated patient.  He was started on Remeron.  Currently resolved and he is back to baseline  Ongoing alcohol abuse with alcohol-induced mood disorder -patient quite irritable when this is being brought up, he complains of chronic pain that is not properly addressed as an outpatient.  He wants better pain control as an outpatient, PCP and pain management before we can even tell him that he needs to stop drinking.  Very adamant about that.  PCP referral made, he has an  appointment with [facility] on 6/3.  Chronic pain -patient reports falling from a roof of a house a few years ago and had L3-4 and 5 fractures.  He is also been complaining of chronic C-spine pain radiating into his shoulders.  Prior imaging showed C3-4 advanced cervical spine degeneration, including chronic spondylolisthesis.  One of his main goals is to get better management for his pain, and asking about injections/outpatient pain management.  Possibly to get a referral once he establishes care with his PCP next week  Abnormal LFTs -due to EtOH, improved  Incidental small CVA -noticed on the MRI on 5/16, which showed a punctate acute cortical/subcortical infarct in the left superior frontal gyrus without hemorrhage or mass effect.  Neurology consulted, underwent a CTA without LVO, 2D echo was not done due to being recently obtained couple of months ago, showing LVEF 60-65% without WMA.  RV was normal.  A1c was 4.9, LDL 48.  Currently on Xarelto, statin  Essential hypertension-on diltiazem as well as metoprolol for rate control, also on midodrine  Hyperlipidemia-continue statin  BPH -has been having some symptoms, Flomax is on hold due to orthostatic hypotension.  Will need outpatient follow-up with urology  Hypotension -improved after fluids but still orthostatic.  Discontinue Flomax.  Now on midodrine  Homelessness -lives at [facility]  Hypomagnesemia-continue to replace magnesium this morning  Scheduled Meds:  acetaminophen  1,000 mg Oral TID   diltiazem  30 mg Oral Q6H   feeding supplement  237 mL Oral BID BM   ferrous sulfate  325 mg Oral Q breakfast   folic acid  1  mg Oral Daily   lidocaine  1 patch Transdermal Q24H   loratadine  10 mg Oral Daily   magnesium oxide  400 mg Oral BID   methocarbamol  500 mg Oral TID   metoprolol succinate  25 mg Oral BID   midodrine  10 mg Oral TID WC   mirtazapine  7.5 mg Oral QHS   mometasone-formoterol  2 puff Inhalation BID   multivitamin with minerals   1 tablet Oral Daily   rivaroxaban  20 mg Oral Q supper   rosuvastatin  10 mg Oral Daily   Continuous Infusions:    PRN Meds:.acetaminophen **OR** acetaminophen, LORazepam, ondansetron **OR** ondansetron (ZOFRAN) IV, mouth rinse, polyvinyl alcohol, simethicone, traMADol  Current Outpatient Medications  Medication Instructions   acetaminophen (TYLENOL) 500 mg, Oral, Every 8 hours PRN   BAYER BACK & BODY PAIN EX ST 500-32.5 MG TABS 1-2 tablets, Oral, Every 6 hours PRN   budesonide-formoterol (SYMBICORT) 160-4.5 MCG/ACT inhaler 2 puffs, Inhalation, 2 times daily   diltiazem (CARDIZEM SR) 60 mg, Oral, Every 12 hours   ferrous sulfate 325 mg, Oral, Daily with breakfast   folic acid (FOLVITE) 1 mg, Oral, Daily   furosemide (LASIX) 20 mg, Oral, Daily   Magnesium Oxide 400 mg, Oral, 2 times daily   methocarbamol (ROBAXIN) 500 mg, Oral, Every 6 hours PRN   metoprolol tartrate (LOPRESSOR) 25 MG tablet Take 1/2 tablet (12.5 mg total) by mouth 2 (two) times daily.   Multiple Vitamins-Minerals (CENTRUM SILVER 50+MEN) TABS 1 tablet, Oral, Every other day   Nutritional Supplements (ENSURE HIGH PROTEIN) LIQD 237 mLs, Oral, Daily   rivaroxaban (XARELTO) 20 MG TABS tablet Take 1 tablet (20 mg) by mouth daily with supper.   rosuvastatin (CRESTOR) 10 mg, Oral, Daily   tamsulosin (FLOMAX) 0.4 mg, Oral, Daily after supper   TYLENOL PM EXTRA STRENGTH 500-25 MG TABS tablet 2 tablets, Oral, At bedtime PRN    Diet Orders (From admission, onward)     Start     Ordered   12/15/22 0001  Diet NPO time specified  Diet effective midnight        12/12/22 0758   12/03/22 1207  Diet regular Room service appropriate? Yes; Fluid consistency: Thin  Diet effective now       Question Answer Comment  Room service appropriate? Yes   Fluid consistency: Thin      12/03/22 1208            DVT prophylaxis: Place TED hose Start: 12/09/22 1316 rivaroxaban (XARELTO) tablet 20 mg   Lab Results  Component Value  Date   PLT 269 12/12/2022      Code Status: Full Code  Family Communication: no family at bedside   Status is: Inpatient  Remains inpatient appropriate because: A fib RVR   Level of care: Telemetry  Consultants:  Cardiology   Objective: Vitals:   12/12/22 0816 12/12/22 0905 12/12/22 1154 12/12/22 1156  BP: 109/86  101/84 110/85  Pulse:   (!) 144 (!) 144  Resp:    16  Temp:    97.6 F (36.4 C)  TempSrc:    Oral  SpO2:  96% 99% 99%  Weight:      Height:        Intake/Output Summary (Last 24 hours) at 12/12/2022 1228 Last data filed at 12/12/2022 1156 Gross per 24 hour  Intake 1080 ml  Output 950 ml  Net 130 ml    Wt Readings from Last 3 Encounters:  12/02/22  60.4 kg  10/10/22 66.7 kg  10/06/22 68 kg    Examination:  Constitutional: NAD Eyes: lids and conjunctivae normal, no scleral icterus ENMT: mmm Neck: normal, supple Respiratory: clear to auscultation bilaterally, no wheezing, no crackles. Normal respiratory effort.  Cardiovascular: Irregular, tachycardic Abdomen: soft, no distention, no tenderness. Bowel sounds positive.   Data Reviewed: I have independently reviewed following labs and imaging studies   CBC Recent Labs  Lab 12/08/22 0422 12/09/22 0434 12/10/22 0407 12/11/22 0422 12/12/22 0445  WBC 4.7 4.7 4.6 5.4 4.7  HGB 9.7* 10.3* 9.6* 9.1* 9.6*  HCT 29.9* 32.2* 30.8* 28.6* 31.0*  PLT 137* 166 206 223 269  MCV 105.3* 105.6* 106.2* 107.1* 108.8*  MCH 34.2* 33.8 33.1 34.1* 33.7  MCHC 32.4 32.0 31.2 31.8 31.0  RDW 20.5* 20.7* 20.8* 21.1* 20.8*     Recent Labs  Lab 12/08/22 0422 12/09/22 0434 12/10/22 0407 12/11/22 0422 12/12/22 0445  NA 136 138 138 136 137  K 4.3 4.2 4.1 4.2 4.7  CL 108 108 107 107 109  CO2 22 23 22 22  20*  GLUCOSE 92 92 78 90 90  BUN 23 20 27* 29* 23  CREATININE 0.83 0.86 0.71 0.72 0.75  CALCIUM 9.1 8.9 9.1 8.8* 8.6*  AST  --   --  38  --   --   ALT  --   --  35  --   --   ALKPHOS  --   --  52  --   --    BILITOT  --   --  0.4  --   --   ALBUMIN  --   --  3.4*  --   --   MG 1.9 1.6* 1.7 1.5* 1.8     ------------------------------------------------------------------------------------------------------------------ No results for input(s): "CHOL", "HDL", "LDLCALC", "TRIG", "CHOLHDL", "LDLDIRECT" in the last 72 hours.  Lab Results  Component Value Date   HGBA1C 4.9 12/04/2022   ------------------------------------------------------------------------------------------------------------------ No results for input(s): "TSH", "T4TOTAL", "T3FREE", "THYROIDAB" in the last 72 hours.  Invalid input(s): "FREET3"  Cardiac Enzymes No results for input(s): "CKMB", "TROPONINI", "MYOGLOBIN" in the last 168 hours.  Invalid input(s): "CK" ------------------------------------------------------------------------------------------------------------------    Component Value Date/Time   BNP 341.6 (H) 12/02/2022 0409    CBG: No results for input(s): "GLUCAP" in the last 168 hours.  No results found for this or any previous visit (from the past 240 hour(s)).   Radiology Studies: No results found.   Pamella Pert, MD, PhD [facility]  Between 7 am - 7 pm I am available, please contact me via Amion (for emergencies) or Securechat (non urgent messages)  Between 7 pm - 7 am I am not available, please contact night coverage MD/APP via Amion

## 2022-12-12 NOTE — Progress Notes (Signed)
Message sent to [facility] inbox to arrange TEE-DCCV. Next open slot is Wed, but will make NPO on Monday night in case of cancellations on Tues.   Marcelino Duster, PA-C 12/12/2022, 7:58 AM [phone] [facility] [address] [place], [place] [address]

## 2022-12-13 DIAGNOSIS — I4891 Unspecified atrial fibrillation: Secondary | ICD-10-CM | POA: Diagnosis not present

## 2022-12-13 DIAGNOSIS — I483 Typical atrial flutter: Secondary | ICD-10-CM | POA: Diagnosis not present

## 2022-12-13 LAB — CBC
HCT: 31.3 % — ABNORMAL LOW (ref 39.0–52.0)
Hemoglobin: 9.7 g/dL — ABNORMAL LOW (ref 13.0–17.0)
MCH: 33.7 pg (ref 26.0–34.0)
MCHC: 31 g/dL (ref 30.0–36.0)
MCV: 108.7 fL — ABNORMAL HIGH (ref 80.0–100.0)
Platelets: 273 10*3/uL (ref 150–400)
RBC: 2.88 MIL/uL — ABNORMAL LOW (ref 4.22–5.81)
RDW: 20.4 % — ABNORMAL HIGH (ref 11.5–15.5)
WBC: 7.1 10*3/uL (ref 4.0–10.5)
nRBC: 0 % (ref 0.0–0.2)

## 2022-12-13 MED ORDER — DIGOXIN 0.25 MG/ML IJ SOLN
0.2500 mg | Freq: Once | INTRAMUSCULAR | Status: AC
  Administered 2022-12-13: 0.25 mg via INTRAVENOUS
  Filled 2022-12-13: qty 2

## 2022-12-13 MED ORDER — MIRTAZAPINE 15 MG PO TABS
15.0000 mg | ORAL_TABLET | Freq: Every day | ORAL | Status: DC
  Administered 2022-12-13 – 2022-12-17 (×5): 15 mg via ORAL
  Filled 2022-12-13 (×5): qty 1

## 2022-12-13 NOTE — Progress Notes (Signed)
PROGRESS NOTE  Anthony [name] ZOX:[id number] DOB: 11-14-1955 DOA: 12/02/2022 PCP: Pcp, No   LOS: 8 days   Brief Narrative / Interim history: 67 year old male, homeless, chronic alcohol use, anxiety, depression comes to the hospital with confusion.  He was noted to have hallucinations.  Apparently he has not been taking his medications.  He was found to be in A-fib with RVR in the ED, and admitted to the hospital.  Cardiology as well as psychiatry were consulted  Subjective / 24h Interval events: Complains of seeing things moving in the room when the light is DM, and when he turns on the lights movement stops.  Reports some hallucinations but he is very vague about all that.  Assesement and Plan: Principal Problem:   Atrial fibrillation with RVR (HCC) Active Problems:   HTN (hypertension)   Paroxysmal atrial fibrillation (HCC)   Hypomagnesemia   Dyslipidemia   Macrocytic anemia   Alcohol abuse with alcohol-induced mood disorder (HCC)   Tobacco use disorder   Hyponatremia   Aortic atherosclerosis (HCC)   Abnormal LFTs   Stroke (cerebrum) (HCC)  Principal problem PAF with RVR -cardiology following, currently with rates still poorly controlled.  Cardiology following, appreciate input.  Currently on diltiazem as well as metoprolol.  Plan for TEE/DCCV next week.  Continue anticoagulation  Active problems Acute metabolic encephalopathy, hallucinations -likely in the setting of EtOH use/withdrawals.  Psychiatry consulted and evaluated patient.  He was started on Remeron. -Increase Remeron dose tonight due to some persistent symptoms  Ongoing alcohol abuse with alcohol-induced mood disorder -patient quite irritable when this is being brought up, he complains of chronic pain that is not properly addressed as an outpatient.  He wants better pain control as an outpatient, PCP and pain management before we can even tell him that he needs to stop drinking.  Very adamant about that.  PCP referral  made, he has an appointment with IM TS on 6/3.  Chronic pain -patient reports falling from a roof of a house a few years ago and had L3-4 and 5 fractures.  He is also been complaining of chronic C-spine pain radiating into his shoulders.  Prior imaging showed C3-4 advanced cervical spine degeneration, including chronic spondylolisthesis.  One of his main goals is to get better management for his pain, and asking about injections/outpatient pain management.  Possibly to get a referral once he establishes care with his PCP next week  Abnormal LFTs -due to EtOH, improved  Incidental small CVA -noticed on the MRI on 5/16, which showed a punctate acute cortical/subcortical infarct in the left superior frontal gyrus without hemorrhage or mass effect.  Neurology consulted, underwent a CTA without LVO, 2D echo was not done due to being recently obtained couple of months ago, showing LVEF 60-65% without WMA.  RV was normal.  A1c was 4.9, LDL 48.  Currently on Xarelto, statin  Essential hypertension-on diltiazem as well as metoprolol for rate control, also on midodrine  Hyperlipidemia-continue statin  BPH -has been having some symptoms, Flomax is on hold due to orthostatic hypotension.  Will need outpatient follow-up with urology  Hypotension -improved after fluids but still orthostatic.  Discontinue Flomax.  Now on midodrine  Homelessness -lives at [facility]  Hypomagnesemia-continue to replace magnesium this morning  Scheduled Meds:  acetaminophen  1,000 mg Oral TID   diltiazem  30 mg Oral Q6H   feeding supplement  237 mL Oral BID BM   ferrous sulfate  325 mg Oral Q breakfast   folic  acid  1 mg Oral Daily   lidocaine  1 patch Transdermal Q24H   loratadine  10 mg Oral Daily   magnesium oxide  400 mg Oral BID   methocarbamol  500 mg Oral TID   metoprolol succinate  25 mg Oral BID   midodrine  10 mg Oral TID WC   mirtazapine  7.5 mg Oral QHS   mometasone-formoterol  2 puff Inhalation BID    multivitamin with minerals  1 tablet Oral Daily   rivaroxaban  20 mg Oral Q supper   rosuvastatin  10 mg Oral Daily   Continuous Infusions:    PRN Meds:.acetaminophen **OR** acetaminophen, LORazepam, ondansetron **OR** ondansetron (ZOFRAN) IV, mouth rinse, polyvinyl alcohol, simethicone, traMADol  Current Outpatient Medications  Medication Instructions   acetaminophen (TYLENOL) 500 mg, Oral, Every 8 hours PRN   BAYER BACK & BODY PAIN EX ST 500-32.5 MG TABS 1-2 tablets, Oral, Every 6 hours PRN   budesonide-formoterol (SYMBICORT) 160-4.5 MCG/ACT inhaler 2 puffs, Inhalation, 2 times daily   diltiazem (CARDIZEM SR) 60 mg, Oral, Every 12 hours   ferrous sulfate 325 mg, Oral, Daily with breakfast   folic acid (FOLVITE) 1 mg, Oral, Daily   furosemide (LASIX) 20 mg, Oral, Daily   Magnesium Oxide 400 mg, Oral, 2 times daily   methocarbamol (ROBAXIN) 500 mg, Oral, Every 6 hours PRN   metoprolol tartrate (LOPRESSOR) 25 MG tablet Take 1/2 tablet (12.5 mg total) by mouth 2 (two) times daily.   Multiple Vitamins-Minerals (CENTRUM SILVER 50+MEN) TABS 1 tablet, Oral, Every other day   Nutritional Supplements (ENSURE HIGH PROTEIN) LIQD 237 mLs, Oral, Daily   rivaroxaban (XARELTO) 20 MG TABS tablet Take 1 tablet (20 mg) by mouth daily with supper.   rosuvastatin (CRESTOR) 10 mg, Oral, Daily   tamsulosin (FLOMAX) 0.4 mg, Oral, Daily after supper   TYLENOL PM EXTRA STRENGTH 500-25 MG TABS tablet 2 tablets, Oral, At bedtime PRN    Diet Orders (From admission, onward)     Start     Ordered   12/15/22 0001  Diet NPO time specified  Diet effective midnight        12/12/22 0758   12/03/22 1207  Diet regular Room service appropriate? Yes; Fluid consistency: Thin  Diet effective now       Question Answer Comment  Room service appropriate? Yes   Fluid consistency: Thin      12/03/22 1208            DVT prophylaxis: Place TED hose Start: 12/09/22 1316 rivaroxaban (XARELTO) tablet 20 mg   Lab  Results  Component Value Date   PLT 273 12/13/2022      Code Status: Full Code  Family Communication: no family at bedside   Status is: Inpatient  Remains inpatient appropriate because: A fib RVR   Level of care: Telemetry  Consultants:  Cardiology   Objective: Vitals:   12/12/22 1814 12/12/22 2240 12/13/22 0331 12/13/22 0853  BP:  (!) 114/90 113/82   Pulse:  86 (!) 103   Resp:  18 20   Temp:  98.9 F (37.2 C) 99.1 F (37.3 C)   TempSrc:  Oral Oral   SpO2: 96% 96% 100% 99%  Weight:      Height:        Intake/Output Summary (Last 24 hours) at 12/13/2022 1033 Last data filed at 12/13/2022 1610 Gross per 24 hour  Intake 240 ml  Output 1625 ml  Net -1385 ml    Wt Readings  from Last 3 Encounters:  12/02/22 60.4 kg  10/10/22 66.7 kg  10/06/22 68 kg    Examination:  Constitutional: NAD Eyes: lids and conjunctivae normal, no scleral icterus ENMT: mmm Neck: normal, supple Respiratory: clear to auscultation bilaterally, no wheezing, no crackles. Normal respiratory effort.  Cardiovascular: Regular rate and rhythm, no murmurs / rubs / gallops. No LE edema. Abdomen: soft, no distention, no tenderness. Bowel sounds positive.    Data Reviewed: I have independently reviewed following labs and imaging studies   CBC Recent Labs  Lab 12/09/22 0434 12/10/22 0407 12/11/22 0422 12/12/22 0445 12/13/22 0425  WBC 4.7 4.6 5.4 4.7 7.1  HGB 10.3* 9.6* 9.1* 9.6* 9.7*  HCT 32.2* 30.8* 28.6* 31.0* 31.3*  PLT 166 206 223 269 273  MCV 105.6* 106.2* 107.1* 108.8* 108.7*  MCH 33.8 33.1 34.1* 33.7 33.7  MCHC 32.0 31.2 31.8 31.0 31.0  RDW 20.7* 20.8* 21.1* 20.8* 20.4*     Recent Labs  Lab 12/08/22 0422 12/09/22 0434 12/10/22 0407 12/11/22 0422 12/12/22 0445  NA 136 138 138 136 137  K 4.3 4.2 4.1 4.2 4.7  CL 108 108 107 107 109  CO2 22 23 22 22  20*  GLUCOSE 92 92 78 90 90  BUN 23 20 27* 29* 23  CREATININE 0.83 0.86 0.71 0.72 0.75  CALCIUM 9.1 8.9 9.1 8.8* 8.6*   AST  --   --  38  --   --   ALT  --   --  35  --   --   ALKPHOS  --   --  52  --   --   BILITOT  --   --  0.4  --   --   ALBUMIN  --   --  3.4*  --   --   MG 1.9 1.6* 1.7 1.5* 1.8     ------------------------------------------------------------------------------------------------------------------ No results for input(s): "CHOL", "HDL", "LDLCALC", "TRIG", "CHOLHDL", "LDLDIRECT" in the last 72 hours.  Lab Results  Component Value Date   HGBA1C 4.9 12/04/2022   ------------------------------------------------------------------------------------------------------------------ No results for input(s): "TSH", "T4TOTAL", "T3FREE", "THYROIDAB" in the last 72 hours.  Invalid input(s): "FREET3"  Cardiac Enzymes No results for input(s): "CKMB", "TROPONINI", "MYOGLOBIN" in the last 168 hours.  Invalid input(s): "CK" ------------------------------------------------------------------------------------------------------------------    Component Value Date/Time   BNP 341.6 (H) 12/02/2022 0409    CBG: No results for input(s): "GLUCAP" in the last 168 hours.  No results found for this or any previous visit (from the past 240 hour(s)).   Radiology Studies: No results found.   Pamella Pert, MD, PhD [facility]  Between 7 am - 7 pm I am available, please contact me via Amion (for emergencies) or Securechat (non urgent messages)  Between 7 pm - 7 am I am not available, please contact night coverage MD/APP via Amion

## 2022-12-13 NOTE — Progress Notes (Signed)
    I attempted to confirm with the patient his desire for TEE-DCCV. He expresses that he has concerns about the procedure. I attempted to address his concerns, he became aggressive on the phone. He states he doesn't want to have the procedure if he remains homeless. I confirm that case management has been consulted, but he states this has been "worthless" and he doesn't know where he's gong after discharge.   I have left the NPO order on for Monday night at [facility]. Will continue ongoing conversation with him during rounds tomorrow to confirm his desire to proceed. Orders and consent will need to be placed.  Roe Rutherford Willene Holian, [place]  12/13/2022 9:53 AM

## 2022-12-13 NOTE — Progress Notes (Signed)
Rounding Note    Patient Name: Anthony [name] Date of Encounter: 12/13/2022  [facility] Cardiologist: Verne Carrow, MD   Subjective   Pt denies CP or dyspnea  Inpatient Medications    Scheduled Meds:  acetaminophen  1,000 mg Oral TID   diltiazem  30 mg Oral Q6H   feeding supplement  237 mL Oral BID BM   ferrous sulfate  325 mg Oral Q breakfast   folic acid  1 mg Oral Daily   lidocaine  1 patch Transdermal Q24H   loratadine  10 mg Oral Daily   magnesium oxide  400 mg Oral BID   methocarbamol  500 mg Oral TID   metoprolol succinate  25 mg Oral BID   midodrine  10 mg Oral TID WC   mirtazapine  7.5 mg Oral QHS   mometasone-formoterol  2 puff Inhalation BID   multivitamin with minerals  1 tablet Oral Daily   rivaroxaban  20 mg Oral Q supper   rosuvastatin  10 mg Oral Daily   Continuous Infusions:  PRN Meds: acetaminophen **OR** acetaminophen, LORazepam, ondansetron **OR** ondansetron (ZOFRAN) IV, mouth rinse, polyvinyl alcohol, simethicone, traMADol   Vital Signs    Vitals:   12/12/22 1521 12/12/22 1814 12/12/22 2240 12/13/22 0331  BP:   (!) 114/90 113/82  Pulse:   86 (!) 103  Resp: 20  18 20   Temp:   98.9 F (37.2 C) 99.1 F (37.3 C)  TempSrc:   Oral Oral  SpO2:  96% 96% 100%  Weight:      Height:        Intake/Output Summary (Last 24 hours) at 12/13/2022 0981 Last data filed at 12/13/2022 1914 Gross per 24 hour  Intake 240 ml  Output 1625 ml  Net -1385 ml       12/02/2022    8:53 AM 10/10/2022    9:00 AM 10/10/2022    2:00 AM  Last 3 Weights  Weight (lbs) 133 lb 2.5 oz 147 lb 0.8 oz 149 lb 14.6 oz  Weight (kg) 60.4 kg 66.7 kg 68 kg      Telemetry    Atrial flutter with RVR - Personally Reviewed   Physical Exam   GEN: NAD Neck: Supple Cardiac: tachycardic and regular Respiratory: CTA GI: Soft, NT/ND MS: No edema; right upper extremity IV site with erythema Neuro:  Grossly intact Psych: Normal affect   Labs    High  Sensitivity Troponin:   Recent Labs  Lab 12/02/22 0409 12/02/22 0858  TROPONINIHS 11 10      Chemistry Recent Labs  Lab 12/10/22 0407 12/11/22 0422 12/12/22 0445  NA 138 136 137  K 4.1 4.2 4.7  CL 107 107 109  CO2 22 22 20*  GLUCOSE 78 90 90  BUN 27* 29* 23  CREATININE 0.71 0.72 0.75  CALCIUM 9.1 8.8* 8.6*  MG 1.7 1.5* 1.8  PROT 7.2  --   --   ALBUMIN 3.4*  --   --   AST 38  --   --   ALT 35  --   --   ALKPHOS 52  --   --   BILITOT 0.4  --   --   GFRNONAA >60 >60 >60  ANIONGAP 9 7 8     Hematology Recent Labs  Lab 12/11/22 0422 12/12/22 0445 12/13/22 0425  WBC 5.4 4.7 7.1  RBC 2.67* 2.85* 2.88*  HGB 9.1* 9.6* 9.7*  HCT 28.6* 31.0* 31.3*  MCV 107.1* 108.8* 108.7*  MCH 34.1* 33.7 33.7  MCHC 31.8 31.0 31.0  RDW 21.1* 20.8* 20.4*  PLT 223 269 273      Patient Profile     67 y.o. 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 Has had numerous prior admissions/ER visits for various medical issues including sepsis, aspiration PNA, traumatic pneumothorax, pressure ulcer, C Diff, delirium. Patient presented this admission via [facility] for hallucinations. Found to have recurrent AF RVR, incidental small stroke, elevated LFTs, hyponatremia, hypomagnesemia, macrocytic anemia, hypotension.  Echocardiogram February 2024 showed normal LV function.  Assessment & Plan    1 persistent atrial flutter-as outlined previously this is an extremely difficult situation.  His heart rate is difficult to control and his blood pressure is borderline.  Continue present dose of metoprolol and Cardizem.  I am very hesitant to add amiodarone given recent CVA (potential to  convert to sinus rhythm).  I do share Dr. Mallory Shirk concern about patient reliably taking medications including Xarelto following discharge.  However I think the only option at this point is to proceed with TEE guided cardioversion.  Will tentatively plan for Tuesday or Wednesday.  Patient understands that if he does not take his medications following procedure his risk of CVA is higher.    2 recent CVA-noted on MRI.  Continue Xarelto.  3 history of coronary artery disease-continue statin.  4 macrocytic anemia-Per primary care.  5 social issues-patient is homeless and has a history of noncompliance.  We need case management to assist with arrangements for medications particularly Xarelto for 4 weeks following discharge.  6 question mild cellulitis at IV site-will leave to primary care.  7 hypotension-patient is on midodrine at present.  Blood pressure is reasonable.  For questions or updates, please contact [facility] Please consult [email or web address] for contact info under        Signed, Olga Millers, MD  12/13/2022, 7:12 AM

## 2022-12-14 DIAGNOSIS — F1014 Alcohol abuse with alcohol-induced mood disorder: Secondary | ICD-10-CM | POA: Diagnosis not present

## 2022-12-14 DIAGNOSIS — I4891 Unspecified atrial fibrillation: Secondary | ICD-10-CM | POA: Diagnosis not present

## 2022-12-14 DIAGNOSIS — I639 Cerebral infarction, unspecified: Secondary | ICD-10-CM | POA: Diagnosis not present

## 2022-12-14 DIAGNOSIS — I483 Typical atrial flutter: Secondary | ICD-10-CM | POA: Diagnosis not present

## 2022-12-14 LAB — CBC
HCT: 30.5 % — ABNORMAL LOW (ref 39.0–52.0)
Hemoglobin: 9.3 g/dL — ABNORMAL LOW (ref 13.0–17.0)
MCH: 34.1 pg — ABNORMAL HIGH (ref 26.0–34.0)
MCHC: 30.5 g/dL (ref 30.0–36.0)
MCV: 111.7 fL — ABNORMAL HIGH (ref 80.0–100.0)
Platelets: 300 10*3/uL (ref 150–400)
RBC: 2.73 MIL/uL — ABNORMAL LOW (ref 4.22–5.81)
RDW: 20.2 % — ABNORMAL HIGH (ref 11.5–15.5)
WBC: 6.6 10*3/uL (ref 4.0–10.5)
nRBC: 0 % (ref 0.0–0.2)

## 2022-12-14 LAB — COMPREHENSIVE METABOLIC PANEL
ALT: 31 U/L (ref 0–44)
AST: 33 U/L (ref 15–41)
Albumin: 3.4 g/dL — ABNORMAL LOW (ref 3.5–5.0)
Alkaline Phosphatase: 54 U/L (ref 38–126)
Anion gap: 9 (ref 5–15)
BUN: 32 mg/dL — ABNORMAL HIGH (ref 8–23)
CO2: 20 mmol/L — ABNORMAL LOW (ref 22–32)
Calcium: 8.7 mg/dL — ABNORMAL LOW (ref 8.9–10.3)
Chloride: 108 mmol/L (ref 98–111)
Creatinine, Ser: 0.69 mg/dL (ref 0.61–1.24)
GFR, Estimated: >60 mL/min (ref >60)
Glucose, Bld: 70 mg/dL (ref 70–99)
Potassium: 4.1 mmol/L (ref 3.5–5.1)
Sodium: 137 mmol/L (ref 135–145)
Total Bilirubin: 0.5 mg/dL (ref 0.3–1.2)
Total Protein: 7.3 g/dL (ref 6.5–8.1)

## 2022-12-14 LAB — MAGNESIUM: Magnesium: 1.6 mg/dL — ABNORMAL LOW (ref 1.7–2.4)

## 2022-12-14 MED ORDER — AMIODARONE HCL 200 MG PO TABS
400.0000 mg | ORAL_TABLET | Freq: Two times a day (BID) | ORAL | Status: DC
  Administered 2022-12-14 (×2): 400 mg via ORAL
  Filled 2022-12-14 (×2): qty 2

## 2022-12-14 MED ORDER — DILTIAZEM HCL ER COATED BEADS 120 MG PO CP24
120.0000 mg | ORAL_CAPSULE | Freq: Every day | ORAL | Status: DC
  Administered 2022-12-15 – 2022-12-18 (×4): 120 mg via ORAL
  Filled 2022-12-14 (×4): qty 1

## 2022-12-14 MED ORDER — MAGNESIUM SULFATE 2 GM/50ML IV SOLN
2.0000 g | Freq: Once | INTRAVENOUS | Status: AC
  Administered 2022-12-14: 2 g via INTRAVENOUS
  Filled 2022-12-14: qty 50

## 2022-12-14 NOTE — Care Management Important Message (Signed)
Important Message  Patient Details IM Letter given. Name: Anthony [name] MRN: [id number] Date of Birth: [birth year]   Medicare Important Message Given:  Yes     Caren Macadam 12/14/2022, 2:34 PM

## 2022-12-14 NOTE — Progress Notes (Signed)
Mobility Specialist - Progress Note   12/14/22 1047  Mobility  Activity Ambulated with assistance in hallway  Level of Assistance Modified independent, requires aide device or extra time  Assistive Device Front wheel walker  Distance Ambulated (ft) 350 ft  Activity Response Tolerated well  Mobility Referral Yes  $Mobility charge 1 Mobility  Mobility Specialist Start Time (ACUTE ONLY) 1038  Mobility Specialist Stop Time (ACUTE ONLY) 1046  Mobility Specialist Time Calculation (min) (ACUTE ONLY) 8 min   Pt received in bed and agreeable to mobility. No complaints during session. Pt to bed after session with all needs met & nurse in room.   [facility]

## 2022-12-14 NOTE — Plan of Care (Signed)
°  Problem: Activity: °Goal: Ability to tolerate increased activity will improve °Outcome: Progressing °  °Problem: Education: °Goal: Knowledge of General Education information will improve °Description: Including pain rating scale, medication(s)/side effects and non-pharmacologic comfort measures °Outcome: Progressing °  °Problem: Health Behavior/Discharge Planning: °Goal: Ability to manage health-related needs will improve °Outcome: Progressing °  °Problem: Activity: °Goal: Risk for activity intolerance will decrease °Outcome: Progressing °  °

## 2022-12-14 NOTE — Progress Notes (Signed)
NT attempted to take patient to ATM at this time. Patient declined due to wanting to sleep/nap. Will pass along to night shift that patient is allowed to go to ATM if needed.

## 2022-12-14 NOTE — Progress Notes (Signed)
PROGRESS NOTE  Anthony [name] UJW:[id number] DOB: 1956-01-06 DOA: 12/02/2022 PCP: Pcp, No   LOS: 9 days   Brief Narrative / Interim history: 67 year old male, homeless, chronic alcohol use, anxiety, depression comes to the hospital with confusion.  He was noted to have hallucinations.  Apparently he has not been taking his medications.  He was found to be in A-fib with RVR in the ED, and admitted to the hospital.  Cardiology as well as psychiatry were consulted  Subjective / 24h Interval events: Did not have a good interaction with the cardiologist earlier on.  No other new complaints  Assesement and Plan: Principal Problem:   Atrial fibrillation with RVR (HCC) Active Problems:   HTN (hypertension)   Paroxysmal atrial fibrillation (HCC)   Hypomagnesemia   Dyslipidemia   Macrocytic anemia   Alcohol abuse with alcohol-induced mood disorder (HCC)   Tobacco use disorder   Hyponatremia   Aortic atherosclerosis (HCC)   Abnormal LFTs   Stroke (cerebrum) (HCC)  Principal problem PAF with RVR -cardiology following, currently with rates still poorly controlled.  Cardiology following, appreciate input.  Appears to be he had an esophageal procedure/surgery done in the remote past, and will not get a TEE/cardioversion.  Discussed with the patient, cardiology has offered as an alternative to do amiodarone, he is agreeable to trying that.  Start amiodarone today, 400 mg BID for 10 days then 200 mg daily  Active problems Acute metabolic encephalopathy, hallucinations -likely in the setting of EtOH use/withdrawals.  Psychiatry consulted and evaluated patient.  He was started on Remeron, dose was increased 5/26 due to poor sleep at night  Ongoing alcohol abuse with alcohol-induced mood disorder -patient quite irritable when this is being brought up, he complains of chronic pain that is not properly addressed as an outpatient.  He wants better pain control as an outpatient, PCP and pain management  before we can even tell him that he needs to stop drinking.  Very adamant about that.  PCP referral made, he has an appointment with IM TS on 6/3.  Chronic pain -patient reports falling from a roof of a house a few years ago and had L3-4 and 5 fractures.  He is also been complaining of chronic C-spine pain radiating into his shoulders.  Prior imaging showed C3-4 advanced cervical spine degeneration, including chronic spondylolisthesis.  One of his main goals is to get better management for his pain, and asking about injections/outpatient pain management.  Possibly to get a referral once he establishes care with his PCP next week  Abnormal LFTs -due to EtOH, improved  Incidental small CVA -noticed on the MRI on 5/16, which showed a punctate acute cortical/subcortical infarct in the left superior frontal gyrus without hemorrhage or mass effect.  Neurology consulted, underwent a CTA without LVO, 2D echo was not done due to being recently obtained couple of months ago, showing LVEF 60-65% without WMA.  RV was normal.  A1c was 4.9, LDL 48.  Currently on Xarelto, statin  Essential hypertension-continue diltiazem, metoprolol for rate control, also midodrine  Hyperlipidemia-continue statin  BPH -has been having some symptoms, Flomax is on hold due to orthostatic hypotension.  Will need outpatient follow-up with urology  Hypotension -improved after fluids but still orthostatic.  Discontinue Flomax.  Now on midodrine  Homelessness -lives at [facility]  Hypomagnesemia-continue to replace magnesium this morning  Scheduled Meds:  acetaminophen  1,000 mg Oral TID   amiodarone  400 mg Oral BID   diltiazem  30 mg Oral  Q6H   feeding supplement  237 mL Oral BID BM   ferrous sulfate  325 mg Oral Q breakfast   folic acid  1 mg Oral Daily   lidocaine  1 patch Transdermal Q24H   loratadine  10 mg Oral Daily   magnesium oxide  400 mg Oral BID   methocarbamol  500 mg Oral TID   metoprolol succinate  25 mg Oral BID    midodrine  10 mg Oral TID WC   mirtazapine  15 mg Oral QHS   mometasone-formoterol  2 puff Inhalation BID   multivitamin with minerals  1 tablet Oral Daily   rivaroxaban  20 mg Oral Q supper   rosuvastatin  10 mg Oral Daily   Continuous Infusions:  magnesium sulfate bolus IVPB 2 g (12/14/22 1102)     PRN Meds:.acetaminophen **OR** acetaminophen, LORazepam, ondansetron **OR** ondansetron (ZOFRAN) IV, mouth rinse, polyvinyl alcohol, simethicone, traMADol  Current Outpatient Medications  Medication Instructions   acetaminophen (TYLENOL) 500 mg, Oral, Every 8 hours PRN   BAYER BACK & BODY PAIN EX ST 500-32.5 MG TABS 1-2 tablets, Oral, Every 6 hours PRN   budesonide-formoterol (SYMBICORT) 160-4.5 MCG/ACT inhaler 2 puffs, Inhalation, 2 times daily   diltiazem (CARDIZEM SR) 60 mg, Oral, Every 12 hours   ferrous sulfate 325 mg, Oral, Daily with breakfast   folic acid (FOLVITE) 1 mg, Oral, Daily   furosemide (LASIX) 20 mg, Oral, Daily   Magnesium Oxide 400 mg, Oral, 2 times daily   methocarbamol (ROBAXIN) 500 mg, Oral, Every 6 hours PRN   metoprolol tartrate (LOPRESSOR) 25 MG tablet Take 1/2 tablet (12.5 mg total) by mouth 2 (two) times daily.   Multiple Vitamins-Minerals (CENTRUM SILVER 50+MEN) TABS 1 tablet, Oral, Every other day   Nutritional Supplements (ENSURE HIGH PROTEIN) LIQD 237 mLs, Oral, Daily   rivaroxaban (XARELTO) 20 MG TABS tablet Take 1 tablet (20 mg) by mouth daily with supper.   rosuvastatin (CRESTOR) 10 mg, Oral, Daily   tamsulosin (FLOMAX) 0.4 mg, Oral, Daily after supper   TYLENOL PM EXTRA STRENGTH 500-25 MG TABS tablet 2 tablets, Oral, At bedtime PRN    Diet Orders (From admission, onward)     Start     Ordered   12/15/22 0001  Diet NPO time specified  Diet effective midnight        12/12/22 0758   12/03/22 1207  Diet regular Room service appropriate? Yes; Fluid consistency: Thin  Diet effective now       Question Answer Comment  Room service appropriate? Yes    Fluid consistency: Thin      12/03/22 1208            DVT prophylaxis: Place TED hose Start: 12/09/22 1316 rivaroxaban (XARELTO) tablet 20 mg   Lab Results  Component Value Date   PLT 300 12/14/2022      Code Status: Full Code  Family Communication: no family at bedside   Status is: Inpatient  Remains inpatient appropriate because: A fib RVR   Level of care: Telemetry  Consultants:  Cardiology   Objective: Vitals:   12/13/22 1540 12/13/22 2122 12/14/22 0427 12/14/22 0850  BP:  106/77 110/83   Pulse:  77 (!) 101   Resp: 20 16 20    Temp:  97.6 F (36.4 C) 97.8 F (36.6 C)   TempSrc:  Oral Oral   SpO2:  100% 100% 99%  Weight:      Height:        Intake/Output Summary (  Last 24 hours) at 12/14/2022 1114 Last data filed at 12/14/2022 0600 Gross per 24 hour  Intake 480 ml  Output 1680 ml  Net -1200 ml    Wt Readings from Last 3 Encounters:  12/02/22 60.4 kg  10/10/22 66.7 kg  10/06/22 68 kg    Examination:  Constitutional: NAD Eyes: lids and conjunctivae normal, no scleral icterus ENMT: mmm Neck: normal, supple Respiratory: clear to auscultation bilaterally, no wheezing, no crackles. Normal respiratory effort.  Cardiovascular: Irregular, tachycardic Abdomen: soft, no distention, no tenderness. Bowel sounds positive.  Skin: no rashes Neurologic: no focal deficits, equal strength   Data Reviewed: I have independently reviewed following labs and imaging studies   CBC Recent Labs  Lab 12/10/22 0407 12/11/22 0422 12/12/22 0445 12/13/22 0425 12/14/22 0344  WBC 4.6 5.4 4.7 7.1 6.6  HGB 9.6* 9.1* 9.6* 9.7* 9.3*  HCT 30.8* 28.6* 31.0* 31.3* 30.5*  PLT 206 223 269 273 300  MCV 106.2* 107.1* 108.8* 108.7* 111.7*  MCH 33.1 34.1* 33.7 33.7 34.1*  MCHC 31.2 31.8 31.0 31.0 30.5  RDW 20.8* 21.1* 20.8* 20.4* 20.2*     Recent Labs  Lab 12/09/22 0434 12/10/22 0407 12/11/22 0422 12/12/22 0445 12/14/22 0344  NA 138 138 136 137 137  K 4.2 4.1  4.2 4.7 4.1  CL 108 107 107 109 108  CO2 23 22 22  20* 20*  GLUCOSE 92 78 90 90 70  BUN 20 27* 29* 23 32*  CREATININE 0.86 0.71 0.72 0.75 0.69  CALCIUM 8.9 9.1 8.8* 8.6* 8.7*  AST  --  38  --   --  33  ALT  --  35  --   --  31  ALKPHOS  --  52  --   --  54  BILITOT  --  0.4  --   --  0.5  ALBUMIN  --  3.4*  --   --  3.4*  MG 1.6* 1.7 1.5* 1.8 1.6*     ------------------------------------------------------------------------------------------------------------------ No results for input(s): "CHOL", "HDL", "LDLCALC", "TRIG", "CHOLHDL", "LDLDIRECT" in the last 72 hours.  Lab Results  Component Value Date   HGBA1C 4.9 12/04/2022   ------------------------------------------------------------------------------------------------------------------ No results for input(s): "TSH", "T4TOTAL", "T3FREE", "THYROIDAB" in the last 72 hours.  Invalid input(s): "FREET3"  Cardiac Enzymes No results for input(s): "CKMB", "TROPONINI", "MYOGLOBIN" in the last 168 hours.  Invalid input(s): "CK" ------------------------------------------------------------------------------------------------------------------    Component Value Date/Time   BNP 341.6 (H) 12/02/2022 0409    CBG: No results for input(s): "GLUCAP" in the last 168 hours.  No results found for this or any previous visit (from the past 240 hour(s)).   Radiology Studies: No results found.   Pamella Pert, MD, PhD [facility]  Between 7 am - 7 pm I am available, please contact me via Amion (for emergencies) or Securechat (non urgent messages)  Between 7 pm - 7 am I am not available, please contact night coverage MD/APP via Amion

## 2022-12-14 NOTE — Progress Notes (Addendum)
Rounding Note    Patient Name: Anthony [name] Date of Encounter: 12/14/2022  [facility] Cardiologist: Verne Carrow, MD   Subjective   Discussed DCCV/TEE. Patient tangential. States he does not understand the explanation. Gets belligerent. Not a productive conversation  Blood prssures are stable  Inpatient Medications    Scheduled Meds:  acetaminophen  1,000 mg Oral TID   diltiazem  30 mg Oral Q6H   feeding supplement  237 mL Oral BID BM   ferrous sulfate  325 mg Oral Q breakfast   folic acid  1 mg Oral Daily   lidocaine  1 patch Transdermal Q24H   loratadine  10 mg Oral Daily   magnesium oxide  400 mg Oral BID   methocarbamol  500 mg Oral TID   metoprolol succinate  25 mg Oral BID   midodrine  10 mg Oral TID WC   mirtazapine  15 mg Oral QHS   mometasone-formoterol  2 puff Inhalation BID   multivitamin with minerals  1 tablet Oral Daily   rivaroxaban  20 mg Oral Q supper   rosuvastatin  10 mg Oral Daily   Continuous Infusions:  PRN Meds: acetaminophen **OR** acetaminophen, LORazepam, ondansetron **OR** ondansetron (ZOFRAN) IV, mouth rinse, polyvinyl alcohol, simethicone, traMADol   Vital Signs    Vitals:   12/13/22 1315 12/13/22 1540 12/13/22 2122 12/14/22 0427  BP: 111/85  106/77 110/83  Pulse: (!) 140  77 (!) 101  Resp: 17 20 16 20   Temp: 98.3 F (36.8 C)  97.6 F (36.4 C) 97.8 F (36.6 C)  TempSrc: Oral  Oral Oral  SpO2: 100%  100% 100%  Weight:      Height:        Intake/Output Summary (Last 24 hours) at 12/14/2022 0837 Last data filed at 12/14/2022 0600 Gross per 24 hour  Intake 480 ml  Output 1680 ml  Net -1200 ml      12/02/2022    8:53 AM 10/10/2022    9:00 AM 10/10/2022    2:00 AM  Last 3 Weights  Weight (lbs) 133 lb 2.5 oz 147 lb 0.8 oz 149 lb 14.6 oz  Weight (kg) 60.4 kg 66.7 kg 68 kg      Telemetry    Atrial flutter rates 90s-100- Personally Reviewed   Physical Exam  Limited exam, patient belligerent  GEN:  NAD Neck: Supple Respiratory: CTA GI: non distended MS: No edema; right upper extremity IV site with erythema Neuro:  Grossly intact Psych: Normal affect   Labs    High Sensitivity Troponin:   Recent Labs  Lab 12/02/22 0409 12/02/22 0858  TROPONINIHS 11 10     Chemistry Recent Labs  Lab 12/10/22 0407 12/11/22 0422 12/12/22 0445 12/14/22 0344  NA 138 136 137 137  K 4.1 4.2 4.7 4.1  CL 107 107 109 108  CO2 22 22 20* 20*  GLUCOSE 78 90 90 70  BUN 27* 29* 23 32*  CREATININE 0.71 0.72 0.75 0.69  CALCIUM 9.1 8.8* 8.6* 8.7*  MG 1.7 1.5* 1.8 1.6*  PROT 7.2  --   --  7.3  ALBUMIN 3.4*  --   --  3.4*  AST 38  --   --  33  ALT 35  --   --  31  ALKPHOS 52  --   --  54  BILITOT 0.4  --   --  0.5  GFRNONAA >60 >60 >60 >60  ANIONGAP 9 7 8 9    Hematology  Recent Labs  Lab 12/12/22 0445 12/13/22 0425 12/14/22 0344  WBC 4.7 7.1 6.6  RBC 2.85* 2.88* 2.73*  HGB 9.6* 9.7* 9.3*  HCT 31.0* 31.3* 30.5*  MCV 108.8* 108.7* 111.7*  MCH 33.7 33.7 34.1*  MCHC 31.0 31.0 30.5  RDW 20.8* 20.4* 20.2*  PLT 269 273 300     Patient Profile     67 y.o. 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 Has had numerous prior admissions/ER visits for various medical issues including sepsis, aspiration PNA, traumatic pneumothorax, pressure ulcer, C Diff, delirium. Patient presented this admission via [facility] for hallucinations. Found to have recurrent AF RVR, incidental small stroke, elevated LFTs, hyponatremia, hypomagnesemia, macrocytic anemia, hypotension.  Echocardiogram February 2024 showed normal LV function.  Assessment & Plan    1 Persistent atrial flutter-as outlined  previously this is an extremely difficult situation.  His heart rate is difficult to control and his blood pressure is borderline.   - Discussed TEE/DCCV, patient is tangential and consent is not feasible. Will not plan for this. Further, patient notes a procedure done on his esophagus at [facility], we do not have records for this - s/p dig load - would recommend starting amiodarone 400 mg BID for 10 days, then amiodarone 200 mg daily; patient currently not amenable to consider. If this changes, can let us know - Continue present dose of metoprolol and Cardizem.    2 recent CVA-noted on MRI.  Continue Xarelto.  3 history of coronary artery disease-continue statin.  4 macrocytic anemia-Per primary care.  5 social issues-patient is homeless and has a history of noncompliance.  We need case management to assist with arrangements for medications particularly Xarelto for 4 weeks following discharge.  6 question mild cellulitis at IV site-will leave to primary care.  7 hypotension-patient is on midodrine at present.  Blood pressure is reasonable.  Cardiology will follow peripherally. Don't hesitate to reach out for questions  For questions or updates, please contact [facility] Please consult [email or web address] for contact info under        Signed, Maisie Fus, MD  12/14/2022, 8:37 AM

## 2022-12-15 ENCOUNTER — Inpatient Hospital Stay (HOSPITAL_COMMUNITY): Payer: 59

## 2022-12-15 DIAGNOSIS — I4891 Unspecified atrial fibrillation: Secondary | ICD-10-CM | POA: Diagnosis not present

## 2022-12-15 LAB — CBC
HCT: 30.6 % — ABNORMAL LOW (ref 39.0–52.0)
Hemoglobin: 9.2 g/dL — ABNORMAL LOW (ref 13.0–17.0)
MCH: 32.9 pg (ref 26.0–34.0)
MCHC: 30.1 g/dL (ref 30.0–36.0)
MCV: 109.3 fL — ABNORMAL HIGH (ref 80.0–100.0)
Platelets: 353 10*3/uL (ref 150–400)
RBC: 2.8 MIL/uL — ABNORMAL LOW (ref 4.22–5.81)
RDW: 19.9 % — ABNORMAL HIGH (ref 11.5–15.5)
WBC: 7.5 10*3/uL (ref 4.0–10.5)
nRBC: 0 % (ref 0.0–0.2)

## 2022-12-15 LAB — BASIC METABOLIC PANEL
Anion gap: 7 (ref 5–15)
BUN: 29 mg/dL — ABNORMAL HIGH (ref 8–23)
CO2: 20 mmol/L — ABNORMAL LOW (ref 22–32)
Calcium: 9 mg/dL (ref 8.9–10.3)
Chloride: 108 mmol/L (ref 98–111)
Creatinine, Ser: 0.58 mg/dL — ABNORMAL LOW (ref 0.61–1.24)
GFR, Estimated: >60 mL/min (ref >60)
Glucose, Bld: 59 mg/dL — ABNORMAL LOW (ref 70–99)
Potassium: 4.3 mmol/L (ref 3.5–5.1)
Sodium: 135 mmol/L (ref 135–145)

## 2022-12-15 LAB — MAGNESIUM: Magnesium: 1.8 mg/dL (ref 1.7–2.4)

## 2022-12-15 LAB — GLUCOSE, CAPILLARY: Glucose-Capillary: 87 mg/dL (ref 70–99)

## 2022-12-15 MED ORDER — AMIODARONE LOAD VIA INFUSION
150.0000 mg | Freq: Once | INTRAVENOUS | Status: AC
  Administered 2022-12-15: 150 mg via INTRAVENOUS
  Filled 2022-12-15: qty 83.34

## 2022-12-15 MED ORDER — AMIODARONE HCL IN DEXTROSE 360-4.14 MG/200ML-% IV SOLN
30.0000 mg/h | INTRAVENOUS | Status: DC
  Administered 2022-12-15 – 2022-12-17 (×4): 30 mg/h via INTRAVENOUS
  Filled 2022-12-15 (×5): qty 200

## 2022-12-15 MED ORDER — AMIODARONE HCL IN DEXTROSE 360-4.14 MG/200ML-% IV SOLN
60.0000 mg/h | INTRAVENOUS | Status: AC
  Administered 2022-12-15 (×2): 60 mg/h via INTRAVENOUS
  Filled 2022-12-15: qty 200

## 2022-12-15 NOTE — Progress Notes (Signed)
Mobility Specialist - Progress Note   12/15/22 1018  Mobility  Activity Ambulated independently in hallway  Level of Assistance Independent  Assistive Device None  Distance Ambulated (ft) 350 ft  Activity Response Tolerated well  Mobility Referral Yes  $Mobility charge 1 Mobility  Mobility Specialist Start Time (ACUTE ONLY) 1014  Mobility Specialist Stop Time (ACUTE ONLY) 1017  Mobility Specialist Time Calculation (min) (ACUTE ONLY) 3 min   Pt received in bed and agreeable to mobility. No complaints during session. Pt to bed after session with all needs met.    [facility]

## 2022-12-15 NOTE — Progress Notes (Addendum)
PROGRESS NOTE  Anthony [name] ZOX:[id number] DOB: 1955/08/12 DOA: 12/02/2022 PCP: Pcp, No   LOS: 10 days   Brief Narrative / Interim history: 67 year old male, homeless, chronic alcohol use, anxiety, depression comes to the hospital with confusion.  He was noted to have hallucinations.  Apparently he has not been taking his medications.  He was found to be in A-fib with RVR in the ED, and admitted to the hospital.  Cardiology as well as psychiatry were consulted  Subjective / 24h Interval events: Tangential talking this morning.  Complains of him having a lot of pain in his teeth and asking to see a dentist in the hospital  Assesement and Plan: Principal Problem:   Atrial fibrillation with RVR (HCC) Active Problems:   HTN (hypertension)   Paroxysmal atrial fibrillation (HCC)   Hypomagnesemia   Dyslipidemia   Macrocytic anemia   Alcohol abuse with alcohol-induced mood disorder (HCC)   Tobacco use disorder   Hyponatremia   Aortic atherosclerosis (HCC)   Abnormal LFTs   Stroke (cerebrum) (HCC)  Principal problem PAF with RVR -cardiology following, currently with rates still poorly controlled.  Cardiology following, appreciate input.  Appears to be he had an esophageal procedure/surgery done in the remote past, and will not get a TEE/cardioversion.  Discussed with the patient, cardiology has offered as an alternative to do amiodarone, he is agreeable to trying that.   -Start IV amiodarone load today  Active problems Acute metabolic encephalopathy, hallucinations -likely in the setting of EtOH use/withdrawals.  Psychiatry consulted and evaluated patient.  He was started on Remeron, dose was increased 5/26 due to poor sleep at night  Poor dentition -patient asking to see a dentist in the hospital as he has been having worsening dental pain.  He is wondering whether he has an infection in his mouth.  Obtain CT maxillofacial  Ongoing alcohol abuse with alcohol-induced mood disorder  -patient quite irritable when this is being brought up, he complains of chronic pain that is not properly addressed as an outpatient.  He wants better pain control as an outpatient, PCP and pain management before we can even tell him that he needs to stop drinking.  Very adamant about that.  PCP referral made, he has an appointment with IM TS on 6/3.  Chronic pain -patient reports falling from a roof of a house a few years ago and had L3-4 and 5 fractures.  He is also been complaining of chronic C-spine pain radiating into his shoulders.  Prior imaging showed C3-4 advanced cervical spine degeneration, including chronic spondylolisthesis.  One of his main goals is to get better management for his pain, and asking about injections/outpatient pain management.  Possibly to get a referral once he establishes care with his PCP next week  Abnormal LFTs -due to EtOH, improved  Incidental small CVA -noticed on the MRI on 5/16, which showed a punctate acute cortical/subcortical infarct in the left superior frontal gyrus without hemorrhage or mass effect.  Neurology consulted, underwent a CTA without LVO, 2D echo was not done due to being recently obtained couple of months ago, showing LVEF 60-65% without WMA.  RV was normal.  A1c was 4.9, LDL 48.  Currently on Xarelto, statin  Essential hypertension-continue diltiazem, metoprolol for rate control, also midodrine  Hyperlipidemia-continue statin  BPH -has been having some symptoms, Flomax is on hold due to orthostatic hypotension.  Will need outpatient follow-up with urology  Hypotension -improved after fluids but still orthostatic.  Discontinue Flomax.  Now on  midodrine  Homelessness -lives at [facility]  Hypomagnesemia- magnesium stable this morning.  Continue to monitor  Scheduled Meds:  acetaminophen  1,000 mg Oral TID   amiodarone  150 mg Intravenous Once   diltiazem  120 mg Oral Daily   feeding supplement  237 mL Oral BID BM   ferrous sulfate  325 mg  Oral Q breakfast   folic acid  1 mg Oral Daily   lidocaine  1 patch Transdermal Q24H   loratadine  10 mg Oral Daily   magnesium oxide  400 mg Oral BID   methocarbamol  500 mg Oral TID   metoprolol succinate  25 mg Oral BID   midodrine  10 mg Oral TID WC   mirtazapine  15 mg Oral QHS   mometasone-formoterol  2 puff Inhalation BID   multivitamin with minerals  1 tablet Oral Daily   rivaroxaban  20 mg Oral Q supper   rosuvastatin  10 mg Oral Daily   Continuous Infusions:  amiodarone     Followed by   amiodarone       PRN Meds:.acetaminophen **OR** acetaminophen, LORazepam, ondansetron **OR** ondansetron (ZOFRAN) IV, mouth rinse, polyvinyl alcohol, simethicone, traMADol  Current Outpatient Medications  Medication Instructions   acetaminophen (TYLENOL) 500 mg, Oral, Every 8 hours PRN   BAYER BACK & BODY PAIN EX ST 500-32.5 MG TABS 1-2 tablets, Oral, Every 6 hours PRN   budesonide-formoterol (SYMBICORT) 160-4.5 MCG/ACT inhaler 2 puffs, Inhalation, 2 times daily   diltiazem (CARDIZEM SR) 60 mg, Oral, Every 12 hours   ferrous sulfate 325 mg, Oral, Daily with breakfast   folic acid (FOLVITE) 1 mg, Oral, Daily   furosemide (LASIX) 20 mg, Oral, Daily   Magnesium Oxide 400 mg, Oral, 2 times daily   methocarbamol (ROBAXIN) 500 mg, Oral, Every 6 hours PRN   metoprolol tartrate (LOPRESSOR) 25 MG tablet Take 1/2 tablet (12.5 mg total) by mouth 2 (two) times daily.   Multiple Vitamins-Minerals (CENTRUM SILVER 50+MEN) TABS 1 tablet, Oral, Every other day   Nutritional Supplements (ENSURE HIGH PROTEIN) LIQD 237 mLs, Oral, Daily   rivaroxaban (XARELTO) 20 MG TABS tablet Take 1 tablet (20 mg) by mouth daily with supper.   rosuvastatin (CRESTOR) 10 mg, Oral, Daily   tamsulosin (FLOMAX) 0.4 mg, Oral, Daily after supper   TYLENOL PM EXTRA STRENGTH 500-25 MG TABS tablet 2 tablets, Oral, At bedtime PRN    Diet Orders (From admission, onward)     Start     Ordered   12/15/22 0627  Diet regular  Room service appropriate? Yes; Fluid consistency: Thin  Diet effective now       Question Answer Comment  Room service appropriate? Yes   Fluid consistency: Thin      12/15/22 0627            DVT prophylaxis: Place TED hose Start: 12/09/22 1316 rivaroxaban (XARELTO) tablet 20 mg   Lab Results  Component Value Date   PLT 353 12/15/2022      Code Status: Full Code  Family Communication: no family at bedside   Status is: Inpatient  Remains inpatient appropriate because: A fib RVR   Level of care: Telemetry  Consultants:  Cardiology   Objective: Vitals:   12/14/22 2213 12/15/22 0539 12/15/22 0545 12/15/22 0738  BP:  (!) 126/101    Pulse:  (!) 110    Resp:  19    Temp:  (!) 97.5 F (36.4 C)    TempSrc:  Oral  SpO2: 98% 100%  99%  Weight:   67.4 kg   Height:        Intake/Output Summary (Last 24 hours) at 12/15/2022 1026 Last data filed at 12/15/2022 0600 Gross per 24 hour  Intake 648.3 ml  Output 950 ml  Net -301.7 ml    Wt Readings from Last 3 Encounters:  12/15/22 67.4 kg  10/10/22 66.7 kg  10/06/22 68 kg    Examination:  Constitutional: NAD Eyes: lids and conjunctivae normal, no scleral icterus ENMT: mmm Neck: normal, supple Respiratory: clear to auscultation bilaterally, no wheezing, no crackles. Cardiovascular: tachycardic  Abdomen: soft, no distention, no tenderness. Bowel sounds positive.  Skin: no rashes Neurologic: no focal deficits, equal strength   Data Reviewed: I have independently reviewed following labs and imaging studies   CBC Recent Labs  Lab 12/11/22 0422 12/12/22 0445 12/13/22 0425 12/14/22 0344 12/15/22 0409  WBC 5.4 4.7 7.1 6.6 7.5  HGB 9.1* 9.6* 9.7* 9.3* 9.2*  HCT 28.6* 31.0* 31.3* 30.5* 30.6*  PLT 223 269 273 300 353  MCV 107.1* 108.8* 108.7* 111.7* 109.3*  MCH 34.1* 33.7 33.7 34.1* 32.9  MCHC 31.8 31.0 31.0 30.5 30.1  RDW 21.1* 20.8* 20.4* 20.2* 19.9*     Recent Labs  Lab 12/10/22 0407  12/11/22 0422 12/12/22 0445 12/14/22 0344 12/15/22 0409  NA 138 136 137 137 135  K 4.1 4.2 4.7 4.1 4.3  CL 107 107 109 108 108  CO2 22 22 20* 20* 20*  GLUCOSE 78 90 90 70 59*  BUN 27* 29* 23 32* 29*  CREATININE 0.71 0.72 0.75 0.69 0.58*  CALCIUM 9.1 8.8* 8.6* 8.7* 9.0  AST 38  --   --  33  --   ALT 35  --   --  31  --   ALKPHOS 52  --   --  54  --   BILITOT 0.4  --   --  0.5  --   ALBUMIN 3.4*  --   --  3.4*  --   MG 1.7 1.5* 1.8 1.6* 1.8     ------------------------------------------------------------------------------------------------------------------ No results for input(s): "CHOL", "HDL", "LDLCALC", "TRIG", "CHOLHDL", "LDLDIRECT" in the last 72 hours.  Lab Results  Component Value Date   HGBA1C 4.9 12/04/2022   ------------------------------------------------------------------------------------------------------------------ No results for input(s): "TSH", "T4TOTAL", "T3FREE", "THYROIDAB" in the last 72 hours.  Invalid input(s): "FREET3"  Cardiac Enzymes No results for input(s): "CKMB", "TROPONINI", "MYOGLOBIN" in the last 168 hours.  Invalid input(s): "CK" ------------------------------------------------------------------------------------------------------------------    Component Value Date/Time   BNP 341.6 (H) 12/02/2022 0409    CBG: Recent Labs  Lab 12/15/22 0603  GLUCAP 87    No results found for this or any previous visit (from the past 240 hour(s)).   Radiology Studies: No results found.   Pamella Pert, MD, PhD [facility]  Between 7 am - 7 pm I am available, please contact me via Amion (for emergencies) or Securechat (non urgent messages)  Between 7 pm - 7 am I am not available, please contact night coverage MD/APP via Amion

## 2022-12-15 NOTE — Plan of Care (Signed)
  Problem: Activity: Goal: Ability to tolerate increased activity will improve Outcome: Progressing   Problem: Education: Goal: Knowledge of General Education information will improve Description: Including pain rating scale, medication(s)/side effects and non-pharmacologic comfort measures Outcome: Progressing   

## 2022-12-15 NOTE — Progress Notes (Addendum)
   Reviewed telemetry. He remains in atrial flutter with rates in the 130s. BP stable. - Discussed with Dr. Carolan Clines -  Will stop PO Amiodarone and switch to IV Amiodarone drip with bolus. - Continue Toprol-XL 25mg  twice daily and Cardizem 120mg  daily. We have had trouble up-titrating rate control agents due to orthostatic hypotension. - Continue Midodrine 10mg  three times daily. - Continue Xarelto 20mg  daily. - Otherwise, please see Dr. Verna Czech rounding note from 12/14/2022. We will continue to follow peripherally and offer recommendations when we can. Very challenging situation given patient is homeless and has a long history of medication non-compliance.  Corrin Parker, PA-C 12/15/2022 9:39 AM

## 2022-12-16 DIAGNOSIS — I4891 Unspecified atrial fibrillation: Secondary | ICD-10-CM | POA: Diagnosis not present

## 2022-12-16 LAB — BASIC METABOLIC PANEL
Anion gap: 9 (ref 5–15)
BUN: 29 mg/dL — ABNORMAL HIGH (ref 8–23)
CO2: 22 mmol/L (ref 22–32)
Calcium: 9.2 mg/dL (ref 8.9–10.3)
Chloride: 106 mmol/L (ref 98–111)
Creatinine, Ser: 0.64 mg/dL (ref 0.61–1.24)
GFR, Estimated: >60 mL/min (ref >60)
Glucose, Bld: 84 mg/dL (ref 70–99)
Potassium: 3.9 mmol/L (ref 3.5–5.1)
Sodium: 137 mmol/L (ref 135–145)

## 2022-12-16 LAB — MAGNESIUM: Magnesium: 1.9 mg/dL (ref 1.7–2.4)

## 2022-12-16 MED ORDER — AMOXICILLIN-POT CLAVULANATE 875-125 MG PO TABS
1.0000 | ORAL_TABLET | Freq: Two times a day (BID) | ORAL | Status: DC
  Administered 2022-12-16 – 2022-12-18 (×5): 1 via ORAL
  Filled 2022-12-16 (×5): qty 1

## 2022-12-16 MED ORDER — BIOTENE DRY MOUTH MT LIQD: 15.0000 mL | OROMUCOSAL | Status: DC | PRN

## 2022-12-16 NOTE — Progress Notes (Signed)
Nutrition Follow-up  DOCUMENTATION CODES:   Non-severe (moderate) malnutrition in context of chronic illness  INTERVENTION:  - Regular diet.  - Ensure Plus High Protein po BID, each supplement provides 350 kcal and 20 grams of protein. - Continue Multivitamin with minerals and folic acid daily for alcohol abuse.     Nutrition status is stable at this time, will sign off. Please re-consult if needed.   NUTRITION DIAGNOSIS:   Moderate Malnutrition related to chronic illness (etoh abuse) as evidenced by moderate muscle depletion, moderate fat depletion, percent weight loss (10.7% in less than 2 months). *ongoing  GOAL:   Patient will meet greater than or equal to 90% of their needs *met  MONITOR:   PO intake, Supplement acceptance, Weight trends  REASON FOR ASSESSMENT:   Malnutrition Screening Tool    ASSESSMENT:   67 year old with PMH significant for alcohol abuse who was admitted for A-fib.  Patient sleeping in bedside chair at time of visit. Several snacks noted to be in room.  Per chart review, patient documented to be consuming 100% of all meals. Has also been drinking Ensure twice daily.  Admit weight: 133#  Current weight: 148# Possible weight gain since admission. Patient has previously reported desire for weight gain.   Nutrition status is stable at this time, will sign off.    Medications reviewed and include: 325mg  ferrous sulfate, 400mg  Mag-ox BID, Remeron, MVI  Labs reviewed:  -   Diet Order:   Diet Order             Diet regular Room service appropriate? Yes; Fluid consistency: Thin  Diet effective now                   EDUCATION NEEDS:  Education needs have been addressed  Skin:  Skin Assessment: Reviewed RN Assessment  Last BM:  5/27  Height:  Ht Readings from Last 1 Encounters:  12/02/22 5\' 9"  (1.753 m)   Weight:  Wt Readings from Last 1 Encounters:  12/15/22 67.4 kg    BMI:  Body mass index is 21.94 kg/m.  Estimated  Nutritional Needs:  Kcal:  1800-2100 kcals Protein:  85-100 grams Fluid:  >/= 1.8L    Shelle Iron RD, LDN For contact information, refer to [facility].

## 2022-12-16 NOTE — Progress Notes (Signed)
Mobility Specialist - Progress Note   12/16/22 1039  Mobility  Activity Ambulated independently in hallway  Level of Assistance Independent  Assistive Device None  Distance Ambulated (ft) 350 ft  Activity Response Tolerated well  Mobility Referral Yes  $Mobility charge 1 Mobility  Mobility Specialist Start Time (ACUTE ONLY) 1028  Mobility Specialist Stop Time (ACUTE ONLY) 1035  Mobility Specialist Time Calculation (min) (ACUTE ONLY) 7 min   Pt received in bed and agreeable to mobility. No complaints during session. Pt to bed after session with all needs met.    [facility]

## 2022-12-16 NOTE — Progress Notes (Addendum)
  Reviewed telemetry. Overall rates better on the IV Amiodarone. Currently in atrial fibrillation with rates ranging from the 60s to 120s (mostly in the 80s to 120s). Reviewed with Dr. Wyline Mood. Will continue with current plan for now: - Continue IV Amiodarone today.  - Continue Toprol-XL 25mg  twice daily and Cardizem 120mg  daily. We have had trouble up-titrating rate control agents due to orthostatic hypotension. - Continue Midodrine 10mg  three times daily. - Continue Xarelto 20mg  daily. - Otherwise, please see Dr. Verna Czech rounding note from 12/14/2022. We will continue to follow peripherally and offer recommendations when we can. Very challenging situation given patient is homeless and has a long history of medication non-compliance.  Corrin Parker, PA-C 12/16/2022 8:47 AM

## 2022-12-16 NOTE — Progress Notes (Signed)
PROGRESS NOTE    Anthony [name]  ZOX:[id number] DOB: [birth year] DOA: 12/02/2022 PCP: Pcp, No   Brief Narrative: 67 year old, homeless, chronic alcohol use, anxiety, depression presented to the hospital with confusion.  He was noted to have hallucination.  Apparently he has not been taking his medication.  He was found to be in A-fib with RVR in the ED and admitted to the hospital for further care.  Cardiology and psychiatric consulted as well.  He has been cleared by psych.  His A-fib has been difficult to control due to orthostatic hypotension.  He was a started on IV amiodarone on 5/28.     Assessment & Plan:   Principal Problem:   Atrial fibrillation with RVR (HCC) Active Problems:   HTN (hypertension)   Paroxysmal atrial fibrillation (HCC)   Hypomagnesemia   Dyslipidemia   Macrocytic anemia   Alcohol abuse with alcohol-induced mood disorder (HCC)   Tobacco use disorder   Hyponatremia   Aortic atherosclerosis (HCC)   Abnormal LFTs   Stroke (cerebrum) (HCC)  1-PAF with RVR -Rate poorly controlled.  Difficulty adjusting medications due to orthostatic hypotension. -Unable to proceed with TEE cardioversion, patient unable to provide consent and he had a history of esophageal procedure and surgery remotely in the past. -Started on IV amiodarone on 5/28 -Continue with metoprolol low-dose and Cardizem  2-Acute metabolic encephalopathy, hallucination: In the setting of alcohol use withdrawal [name] consulted and evaluated patient.  He was started on Remeron, dose increased 5/26 due to poor sleep at night. Has been cleared by psych.  3-Poor dentition: CT maxillofacial negative for abscess, did show multiple cavities -Will give a trial of Augmentin for 5 days.  He will need to follow-up with a dentist as an outpatient.  4-Ongoing alcohol abuse with alcohol-induced mood disorder: Completed CIWA protocol, thiamine and folic acid  5-Chronic pain: He will need follow-up with PCP and  will need referral for pain clinic  6-Abnormal liver function test due to EtOH.  Improved  7-Incidental small CVA:  -noticed on the MRI on 5/16, which showed a punctate acute cortical/subcortical infarct in the left superior frontal gyrus without hemorrhage or mass effect.  -Neurology consulted, underwent CTA without LVO, 2D echo was not done due to being recently obtained couple of months ago.  A1c was 4.9, LDL 48.  Continue with thyroid on a statin  8-Essential  hypertension: Continue with diltiazem, metoprolol and midodrine  Hyperlipidemia:  on statins  BPH: Flomax on hold due to orthostatic hypotension.  Will need outpatient follow-up with urology Chronic orthostatic hypotension.  Started on midodrine.  Discontinue Flomax  Homelessness: Lives at [facility]. Hypomagnesemia replace   Nutrition Problem: Moderate Malnutrition Etiology: chronic illness (etoh abuse)    Signs/Symptoms: moderate muscle depletion, moderate fat depletion, percent weight loss (10.7% in less than 2 months) Percent weight loss: 10.7 % (in less than 2 months)    Interventions: Refer to RD note for recommendations, Ensure Enlive (each supplement provides 350kcal and 20 grams of protein), MVI, Liberalize Diet  Estimated body mass index is 21.94 kg/m as calculated from the following:   Height as of this encounter: 5\' 9"  (1.753 m).   Weight as of this encounter: 67.4 kg.   DVT prophylaxis: Xarelto  Code Status: Full code Family Communication: care discussed with patient Disposition Plan:  Status is: Inpatient Remains inpatient appropriate because: management of  A fib    Consultants:  Cardiology   Procedures:  none  Antimicrobials:    Subjective: He is  alert, denies chest pain, SOB   Objective: Vitals:   12/15/22 2025 12/15/22 2357 12/16/22 0430 12/16/22 0823  BP:  121/73 (!) 127/98   Pulse:  82 78   Resp:   18   Temp:  98.2 F (36.8 C) 97.7 F (36.5 C)   TempSrc:  Oral Oral   SpO2:  99% 100% 100% 99%  Weight:      Height:        Intake/Output Summary (Last 24 hours) at 12/16/2022 1157 Last data filed at 12/16/2022 0942 Gross per 24 hour  Intake 1569.84 ml  Output 1550 ml  Net 19.84 ml   Filed Weights   12/02/22 0853 12/15/22 0545  Weight: 60.4 kg 67.4 kg    Examination:  General exam: Appears calm and comfortable  Respiratory system: Clear to auscultation. Respiratory effort normal. Cardiovascular system: S1 & S2 heard, RRR.  Gastrointestinal system: Abdomen is nondistended, soft and nontender. No organomegaly or masses felt. Normal bowel sounds heard. Central nervous system: Alert and oriented.  Extremities: Symmetric 5 x 5 power.   Data Reviewed: I have personally reviewed following labs and imaging studies  CBC: Recent Labs  Lab 12/11/22 0422 12/12/22 0445 12/13/22 0425 12/14/22 0344 12/15/22 0409  WBC 5.4 4.7 7.1 6.6 7.5  HGB 9.1* 9.6* 9.7* 9.3* 9.2*  HCT 28.6* 31.0* 31.3* 30.5* 30.6*  MCV 107.1* 108.8* 108.7* 111.7* 109.3*  PLT 223 269 273 300 353   Basic Metabolic Panel: Recent Labs  Lab 12/11/22 0422 12/12/22 0445 12/14/22 0344 12/15/22 0409 12/16/22 0417  NA 136 137 137 135 137  K 4.2 4.7 4.1 4.3 3.9  CL 107 109 108 108 106  CO2 22 20* 20* 20* 22  GLUCOSE 90 90 70 59* 84  BUN 29* 23 32* 29* 29*  CREATININE 0.72 0.75 0.69 0.58* 0.64  CALCIUM 8.8* 8.6* 8.7* 9.0 9.2  MG 1.5* 1.8 1.6* 1.8 1.9   GFR: Estimated Creatinine Clearance: 85.4 mL/min (by C-G formula based on SCr of 0.64 mg/dL). Liver Function Tests: Recent Labs  Lab 12/10/22 0407 12/14/22 0344  AST 38 33  ALT 35 31  ALKPHOS 52 54  BILITOT 0.4 0.5  PROT 7.2 7.3  ALBUMIN 3.4* 3.4*   No results for input(s): "LIPASE", "AMYLASE" in the last 168 hours. No results for input(s): "AMMONIA" in the last 168 hours. Coagulation Profile: No results for input(s): "INR", "PROTIME" in the last 168 hours. Cardiac Enzymes: No results for input(s): "CKTOTAL", "CKMB",  "CKMBINDEX", "TROPONINI" in the last 168 hours. BNP (last 3 results) No results for input(s): "PROBNP" in the last 8760 hours. HbA1C: No results for input(s): "HGBA1C" in the last 72 hours. CBG: Recent Labs  Lab 12/15/22 0603  GLUCAP 87   Lipid Profile: No results for input(s): "CHOL", "HDL", "LDLCALC", "TRIG", "CHOLHDL", "LDLDIRECT" in the last 72 hours. Thyroid Function Tests: No results for input(s): "TSH", "T4TOTAL", "FREET4", "T3FREE", "THYROIDAB" in the last 72 hours. Anemia Panel: No results for input(s): "VITAMINB12", "FOLATE", "FERRITIN", "TIBC", "IRON", "RETICCTPCT" in the last 72 hours. Sepsis Labs: No results for input(s): "PROCALCITON", "LATICACIDVEN" in the last 168 hours.  No results found for this or any previous visit (from the past 240 hour(s)).       Radiology Studies: CT MAXILLOFACIAL WO CONTRAST  Result Date: 12/15/2022 CLINICAL DATA:  Multiple dental caries.  Severe pain. EXAM: CT MAXILLOFACIAL WITHOUT CONTRAST TECHNIQUE: Multidetector CT imaging of the maxillofacial structures was performed. Multiplanar CT image reconstructions were also generated. RADIATION DOSE REDUCTION: This exam  was performed according to the departmental dose-optimization program which includes automated exposure control, adjustment of the mA and/or kV according to patient size and/or use of iterative reconstruction technique. COMPARISON:  None Available. FINDINGS: Osseous: Extensive dental caries of the remaining maxillary and mandibular teeth with periapical lucencies of the bilateral central and left maxillary incisors, as well as the bilateral mandibular lateral incisors and right mandibular canine. Severe degenerative changes of the cervical spine with at least mild spinal canal stenosis at C3-4. Orbits: Negative. No traumatic or inflammatory finding. Sinuses: Well aerated. Soft tissues: Atherosclerotic calcifications of the carotid bulbs. Limited intracranial: Unremarkable. IMPRESSION:  1. Extensive dental caries of the remaining maxillary and mandibular teeth with periapical lucencies of the bilateral central and left maxillary incisors, as well as the bilateral mandibular lateral incisors and right mandibular canine. 2. Severe degenerative changes of the cervical spine with at least mild spinal canal stenosis at C3-4. Electronically Signed   By: Orvan Falconer M.D.   On: 12/15/2022 14:15        Scheduled Meds:  acetaminophen  1,000 mg Oral TID   amoxicillin-clavulanate  1 tablet Oral Q12H   diltiazem  120 mg Oral Daily   feeding supplement  237 mL Oral BID BM   ferrous sulfate  325 mg Oral Q breakfast   folic acid  1 mg Oral Daily   lidocaine  1 patch Transdermal Q24H   loratadine  10 mg Oral Daily   magnesium oxide  400 mg Oral BID   methocarbamol  500 mg Oral TID   metoprolol succinate  25 mg Oral BID   midodrine  10 mg Oral TID WC   mirtazapine  15 mg Oral QHS   mometasone-formoterol  2 puff Inhalation BID   multivitamin with minerals  1 tablet Oral Daily   rivaroxaban  20 mg Oral Q supper   rosuvastatin  10 mg Oral Daily   Continuous Infusions:  amiodarone 30 mg/hr (12/16/22 1107)     LOS: 11 days    Time spent: 35 minutes    Geneva Pallas A Antavius Sperbeck, MD [facility]   If 7PM-7AM, please contact night-coverage [email or web address]  12/16/2022, 11:57 AM

## 2022-12-17 DIAGNOSIS — I4891 Unspecified atrial fibrillation: Secondary | ICD-10-CM | POA: Diagnosis not present

## 2022-12-17 LAB — BASIC METABOLIC PANEL
Anion gap: 10 (ref 5–15)
BUN: 30 mg/dL — ABNORMAL HIGH (ref 8–23)
CO2: 19 mmol/L — ABNORMAL LOW (ref 22–32)
Calcium: 9.2 mg/dL (ref 8.9–10.3)
Chloride: 107 mmol/L (ref 98–111)
Creatinine, Ser: 0.85 mg/dL (ref 0.61–1.24)
GFR, Estimated: >60 mL/min (ref >60)
Glucose, Bld: 99 mg/dL (ref 70–99)
Potassium: 4.1 mmol/L (ref 3.5–5.1)
Sodium: 136 mmol/L (ref 135–145)

## 2022-12-17 LAB — MAGNESIUM: Magnesium: 1.9 mg/dL (ref 1.7–2.4)

## 2022-12-17 MED ORDER — AMIODARONE HCL 200 MG PO TABS
400.0000 mg | ORAL_TABLET | Freq: Two times a day (BID) | ORAL | Status: DC
  Administered 2022-12-17 – 2022-12-18 (×3): 400 mg via ORAL
  Filled 2022-12-17 (×3): qty 2

## 2022-12-17 MED ORDER — AMIODARONE HCL 200 MG PO TABS: 200.0000 mg | ORAL_TABLET | Freq: Every day | ORAL | Status: DC

## 2022-12-17 NOTE — TOC Progression Note (Signed)
Transition of Care [facility]) - Progression Note    Patient Details  Name: Anthony [name] MRN: [id number] Date of Birth: 1956-07-01  Transition of Care [facility]) CM/SW Contact  Howell Rucks, RN Phone Number: 12/17/2022, 1:43 PM  Clinical Narrative:   Pt requesting to speak with TOC. Met with pt a bedside, pt requesting assistance with housing, shelter resources previously provided, pt previously voiced that he will discharge to the [facility]. Pt given other options: staying with family/friends, extended stay hotel, pt reports he has no family/friends, reports extended stay hotel is too expensive. NCM will provide transport to [facility] at discharge. Rollator rep-Jermaine to be delivered to pt's room today. No further TOC needs identified    Expected Discharge Plan: Homeless Shelter Barriers to Discharge: Continued Medical Work up  Expected Discharge Plan and Services   Discharge Planning Services: CM Consult   Living arrangements for the past 2 months: Group Home                                       Social Determinants of Health (SDOH) Interventions SDOH Screenings   Food Insecurity: Food Insecurity Present (12/11/2022)  Housing: High Risk (12/02/2022)  Transportation Needs: Unmet Transportation Needs (12/11/2022)  Utilities: Not At Risk (12/11/2022)  Recent Concern: Utilities - At Risk (12/02/2022)  Depression (PHQ2-9): Medium Risk (10/16/2020)  Tobacco Use: High Risk (12/11/2022)    Readmission Risk Interventions    10/12/2022    1:29 PM 08/28/2022    2:00 PM 08/27/2022   10:30 AM  Readmission Risk Prevention Plan  Transportation Screening Complete Complete Complete  Medication Review Oceanographer) Complete  Complete  PCP or Specialist appointment within 3-5 days of discharge Complete Complete Complete  HRI or Home Care Consult Complete  Complete  SW Recovery Care/Counseling Consult Complete Complete Complete  Palliative Care Screening Not Applicable Not Applicable Not Applicable   Skilled Nursing Facility Not Applicable Not Applicable Not Applicable

## 2022-12-17 NOTE — Progress Notes (Signed)
PROGRESS NOTE    Anthony [name]  ZOX:[id number] DOB: 1955/08/24 DOA: 12/02/2022 PCP: Pcp, No   Brief Narrative: 67 year old, homeless, chronic alcohol use, anxiety, depression presented to the hospital with confusion.  He was noted to have hallucination.  Apparently he has not been taking his medication.  He was found to be in A-fib with RVR in the ED and admitted to the hospital for further care.  Cardiology and psychiatric consulted as well.  He has been cleared by psych.  His A-fib has been difficult to control due to orthostatic hypotension.  He was a started on IV amiodarone on 5/28.   Assessment & Plan:   Principal Problem:   Atrial fibrillation with RVR (HCC) Active Problems:   HTN (hypertension)   Paroxysmal atrial fibrillation (HCC)   Hypomagnesemia   Dyslipidemia   Macrocytic anemia   Alcohol abuse with alcohol-induced mood disorder (HCC)   Tobacco use disorder   Hyponatremia   Aortic atherosclerosis (HCC)   Abnormal LFTs   Stroke (cerebrum) (HCC)  1-PAF with RVR: -Rate poorly controlled.  Difficulty adjusting medications due to orthostatic hypotension. -Unable to proceed with TEE cardioversion, patient unable to provide consent and he had a history of esophageal procedure and surgery remotely in the past. -Started on IV amiodarone on 5/28-- transition to oral amiodarone today.  -Continue with metoprolol low-dose and Cardizem Might be able to be discharge tomorrow if stable.   2-Acute metabolic encephalopathy, hallucination: In the setting of alcohol use withdrawal [name] consulted and evaluated patient.  He was started on Remeron, dose increased 5/26 due to poor sleep at night. Has been cleared by psych.  3-Poor dentition: CT maxillofacial negative for abscess, did show multiple cavities -Will give a trial of Augmentin for 5 days.  He will need to follow-up with a dentist as an outpatient.  4-Ongoing alcohol abuse with alcohol-induced mood disorder: Completed CIWA  protocol, thiamine and folic acid  5-Chronic pain: He will need follow-up with PCP and will need referral for pain clinic  6-Abnormal liver function test due to EtOH.  Improved  7-Incidental small CVA:  -noticed on the MRI on 5/16, which showed a punctate acute cortical/subcortical infarct in the left superior frontal gyrus without hemorrhage or mass effect.  -Neurology consulted, underwent CTA without LVO, 2D echo was not done due to being recently obtained couple of months ago.  A1c was 4.9, LDL 48.  Continue with thyroid on a statin.  8-Essential  hypertension: Continue with diltiazem, metoprolol and midodrine  Hyperlipidemia:  on statins  BPH: Flomax on hold due to orthostatic hypotension.  Will need outpatient follow-up with urology Chronic orthostatic hypotension.  Started on midodrine.  Discontinue Flomax  Homelessness: Lives at [facility]. Hypomagnesemia replace   Nutrition Problem: Moderate Malnutrition Etiology: chronic illness (etoh abuse)    Signs/Symptoms: moderate muscle depletion, moderate fat depletion, percent weight loss (10.7% in less than 2 months) Percent weight loss: 10.7 % (in less than 2 months)    Interventions: Refer to RD note for recommendations, Ensure Enlive (each supplement provides 350kcal and 20 grams of protein), MVI, Liberalize Diet  Estimated body mass index is 21.94 kg/m as calculated from the following:   Height as of this encounter: 5\' 9"  (1.753 m).   Weight as of this encounter: 67.4 kg.   DVT prophylaxis: Xarelto  Code Status: Full code Family Communication: care discussed with patient Disposition Plan:  Status is: Inpatient Remains inpatient appropriate because: management of  A fib    Consultants:  Cardiology   Procedures:  none  Antimicrobials:    Subjective: He has been ambulating in the hall.  He report no significant dizziness with ambulation.   Objective: Vitals:   12/16/22 2038 12/17/22 0100 12/17/22 0632  12/17/22 0635  BP: (!) 125/95 128/82 (!) 136/97   Pulse: 91 97 (!) 120 100  Resp: 18  20   Temp: 98.1 F (36.7 C) 98.3 F (36.8 C) 98.3 F (36.8 C)   TempSrc: Oral Oral Oral   SpO2: 100% 99% 99%   Weight:      Height:        Intake/Output Summary (Last 24 hours) at 12/17/2022 1038 Last data filed at 12/17/2022 0636 Gross per 24 hour  Intake 907.89 ml  Output 1850 ml  Net -942.11 ml    Filed Weights   12/02/22 0853 12/15/22 0545  Weight: 60.4 kg 67.4 kg    Examination:  General exam: NAD Respiratory system: CTA Cardiovascular system: S 1, S 2 RRR Gastrointestinal system: BS present, soft, nt Central nervous system: alert, oriented.  Extremities: Symmetric power.    Data Reviewed: I have personally reviewed following labs and imaging studies  CBC: Recent Labs  Lab 12/11/22 0422 12/12/22 0445 12/13/22 0425 12/14/22 0344 12/15/22 0409  WBC 5.4 4.7 7.1 6.6 7.5  HGB 9.1* 9.6* 9.7* 9.3* 9.2*  HCT 28.6* 31.0* 31.3* 30.5* 30.6*  MCV 107.1* 108.8* 108.7* 111.7* 109.3*  PLT 223 269 273 300 353    Basic Metabolic Panel: Recent Labs  Lab 12/11/22 0422 12/12/22 0445 12/14/22 0344 12/15/22 0409 12/16/22 0417  NA 136 137 137 135 137  K 4.2 4.7 4.1 4.3 3.9  CL 107 109 108 108 106  CO2 22 20* 20* 20* 22  GLUCOSE 90 90 70 59* 84  BUN 29* 23 32* 29* 29*  CREATININE 0.72 0.75 0.69 0.58* 0.64  CALCIUM 8.8* 8.6* 8.7* 9.0 9.2  MG 1.5* 1.8 1.6* 1.8 1.9    GFR: Estimated Creatinine Clearance: 85.4 mL/min (by C-G formula based on SCr of 0.64 mg/dL). Liver Function Tests: Recent Labs  Lab 12/14/22 0344  AST 33  ALT 31  ALKPHOS 54  BILITOT 0.5  PROT 7.3  ALBUMIN 3.4*    No results for input(s): "LIPASE", "AMYLASE" in the last 168 hours. No results for input(s): "AMMONIA" in the last 168 hours. Coagulation Profile: No results for input(s): "INR", "PROTIME" in the last 168 hours. Cardiac Enzymes: No results for input(s): "CKTOTAL", "CKMB", "CKMBINDEX",  "TROPONINI" in the last 168 hours. BNP (last 3 results) No results for input(s): "PROBNP" in the last 8760 hours. HbA1C: No results for input(s): "HGBA1C" in the last 72 hours. CBG: Recent Labs  Lab 12/15/22 0603  GLUCAP 87    Lipid Profile: No results for input(s): "CHOL", "HDL", "LDLCALC", "TRIG", "CHOLHDL", "LDLDIRECT" in the last 72 hours. Thyroid Function Tests: No results for input(s): "TSH", "T4TOTAL", "FREET4", "T3FREE", "THYROIDAB" in the last 72 hours. Anemia Panel: No results for input(s): "VITAMINB12", "FOLATE", "FERRITIN", "TIBC", "IRON", "RETICCTPCT" in the last 72 hours. Sepsis Labs: No results for input(s): "PROCALCITON", "LATICACIDVEN" in the last 168 hours.  No results found for this or any previous visit (from the past 240 hour(s)).       Radiology Studies: CT MAXILLOFACIAL WO CONTRAST  Result Date: 12/15/2022 CLINICAL DATA:  Multiple dental caries.  Severe pain. EXAM: CT MAXILLOFACIAL WITHOUT CONTRAST TECHNIQUE: Multidetector CT imaging of the maxillofacial structures was performed. Multiplanar CT image reconstructions were also generated. RADIATION DOSE REDUCTION: This exam  was performed according to the departmental dose-optimization program which includes automated exposure control, adjustment of the mA and/or kV according to patient size and/or use of iterative reconstruction technique. COMPARISON:  None Available. FINDINGS: Osseous: Extensive dental caries of the remaining maxillary and mandibular teeth with periapical lucencies of the bilateral central and left maxillary incisors, as well as the bilateral mandibular lateral incisors and right mandibular canine. Severe degenerative changes of the cervical spine with at least mild spinal canal stenosis at C3-4. Orbits: Negative. No traumatic or inflammatory finding. Sinuses: Well aerated. Soft tissues: Atherosclerotic calcifications of the carotid bulbs. Limited intracranial: Unremarkable. IMPRESSION: 1.  Extensive dental caries of the remaining maxillary and mandibular teeth with periapical lucencies of the bilateral central and left maxillary incisors, as well as the bilateral mandibular lateral incisors and right mandibular canine. 2. Severe degenerative changes of the cervical spine with at least mild spinal canal stenosis at C3-4. Electronically Signed   By: Orvan Falconer M.D.   On: 12/15/2022 14:15        Scheduled Meds:  acetaminophen  1,000 mg Oral TID   amoxicillin-clavulanate  1 tablet Oral Q12H   diltiazem  120 mg Oral Daily   feeding supplement  237 mL Oral BID BM   ferrous sulfate  325 mg Oral Q breakfast   folic acid  1 mg Oral Daily   lidocaine  1 patch Transdermal Q24H   loratadine  10 mg Oral Daily   magnesium oxide  400 mg Oral BID   methocarbamol  500 mg Oral TID   metoprolol succinate  25 mg Oral BID   midodrine  10 mg Oral TID WC   mirtazapine  15 mg Oral QHS   mometasone-formoterol  2 puff Inhalation BID   multivitamin with minerals  1 tablet Oral Daily   rivaroxaban  20 mg Oral Q supper   rosuvastatin  10 mg Oral Daily   Continuous Infusions:  amiodarone 30 mg/hr (12/17/22 0649)     LOS: 12 days    Time spent: 35 minutes    Araly Kaas A Lanae Federer, MD [facility]   If 7PM-7AM, please contact night-coverage [email or web address]  12/17/2022, 10:38 AM

## 2022-12-17 NOTE — Plan of Care (Signed)
Will transition to oral amiodarone today.  Unfortunately cannot uptitrate rate control with low blood pressures. Not septic or in cardiogenic shock.  As long as HR <110 and he is asymptomatic, can continue his current regimen.

## 2022-12-17 NOTE — Care Management Important Message (Signed)
Important Message  Patient Details IM Letter given Name: AADHI SMYSER MRN: [id number] Date of Birth: 02-07-1956   Medicare Important Message Given:  Yes     Caren Macadam 12/17/2022, 11:24 AM

## 2022-12-17 NOTE — Progress Notes (Signed)
Mobility Specialist - Progress Note   12/17/22 1557  Mobility  Activity Ambulated independently in hallway  Level of Assistance Independent  Assistive Device None  Distance Ambulated (ft) 350 ft  Activity Response Tolerated well  Mobility Referral Yes  $Mobility charge 1 Mobility   Pt received in bed and agreeable to mobility. No complaints during session. Pt to bed after session with all needs met.    [facility]

## 2022-12-18 ENCOUNTER — Other Ambulatory Visit (HOSPITAL_COMMUNITY): Payer: Self-pay

## 2022-12-18 ENCOUNTER — Other Ambulatory Visit: Payer: Self-pay

## 2022-12-18 DIAGNOSIS — I4891 Unspecified atrial fibrillation: Secondary | ICD-10-CM | POA: Diagnosis not present

## 2022-12-18 MED ORDER — METHOCARBAMOL 500 MG PO TABS
500.0000 mg | ORAL_TABLET | Freq: Four times a day (QID) | ORAL | 0 refills | Status: DC | PRN
  Filled 2022-12-18: qty 40, 10d supply, fill #0

## 2022-12-18 MED ORDER — DILTIAZEM HCL ER COATED BEADS 120 MG PO CP24
120.0000 mg | ORAL_CAPSULE | Freq: Every day | ORAL | 1 refills | Status: DC
  Filled 2022-12-18: qty 30, 30d supply, fill #0

## 2022-12-18 MED ORDER — FERROUS SULFATE 325 (65 FE) MG PO TABS
325.0000 mg | ORAL_TABLET | Freq: Every day | ORAL | 0 refills | Status: DC
  Filled 2022-12-18: qty 30, 30d supply, fill #0

## 2022-12-18 MED ORDER — MIDODRINE HCL 10 MG PO TABS
10.0000 mg | ORAL_TABLET | Freq: Three times a day (TID) | ORAL | 1 refills | Status: DC
  Filled 2022-12-18: qty 120, 40d supply, fill #0

## 2022-12-18 MED ORDER — CENTRUM SILVER 50+MEN PO TABS
1.0000 | ORAL_TABLET | ORAL | 0 refills | Status: DC
  Filled 2022-12-18: qty 30, fill #0

## 2022-12-18 MED ORDER — ROSUVASTATIN CALCIUM 10 MG PO TABS
10.0000 mg | ORAL_TABLET | Freq: Every day | ORAL | 0 refills | Status: DC
  Filled 2022-12-18: qty 30, 30d supply, fill #0

## 2022-12-18 MED ORDER — MIRTAZAPINE 15 MG PO TABS
15.0000 mg | ORAL_TABLET | Freq: Every day | ORAL | 0 refills | Status: DC
  Filled 2022-12-18: qty 30, 30d supply, fill #0

## 2022-12-18 MED ORDER — AMIODARONE HCL 200 MG PO TABS
400.0000 mg | ORAL_TABLET | Freq: Two times a day (BID) | ORAL | 0 refills | Status: DC
  Filled 2022-12-18: qty 40, 10d supply, fill #0

## 2022-12-18 MED ORDER — RIVAROXABAN 20 MG PO TABS
20.0000 mg | ORAL_TABLET | Freq: Every day | ORAL | 2 refills | Status: DC
  Filled 2022-12-18: qty 30, 30d supply, fill #0

## 2022-12-18 MED ORDER — AMOXICILLIN-POT CLAVULANATE 875-125 MG PO TABS
1.0000 | ORAL_TABLET | Freq: Two times a day (BID) | ORAL | 0 refills | Status: AC
  Filled 2022-12-18: qty 6, 3d supply, fill #0

## 2022-12-18 MED ORDER — FOLIC ACID 1 MG PO TABS
1.0000 mg | ORAL_TABLET | Freq: Every day | ORAL | 0 refills | Status: DC
  Filled 2022-12-18: qty 30, 30d supply, fill #0

## 2022-12-18 MED ORDER — LORATADINE 10 MG PO TABS
10.0000 mg | ORAL_TABLET | Freq: Every day | ORAL | 0 refills | Status: DC
  Filled 2022-12-18: qty 30, 30d supply, fill #0

## 2022-12-18 MED ORDER — MAGNESIUM OXIDE -MG SUPPLEMENT 400 (240 MG) MG PO TABS
400.0000 mg | ORAL_TABLET | Freq: Two times a day (BID) | ORAL | 0 refills | Status: DC
  Filled 2022-12-18: qty 60, 30d supply, fill #0

## 2022-12-18 MED ORDER — METOPROLOL SUCCINATE ER 25 MG PO TB24
25.0000 mg | ORAL_TABLET | Freq: Two times a day (BID) | ORAL | 2 refills | Status: DC
  Filled 2022-12-18: qty 60, 30d supply, fill #0

## 2022-12-18 MED ORDER — AMIODARONE HCL 200 MG PO TABS
200.0000 mg | ORAL_TABLET | Freq: Every day | ORAL | 0 refills | Status: DC
  Filled 2022-12-18: qty 30, 30d supply, fill #0

## 2022-12-18 MED ORDER — MOMETASONE FURO-FORMOTEROL FUM 200-5 MCG/ACT IN AERO
2.0000 | INHALATION_SPRAY | Freq: Two times a day (BID) | RESPIRATORY_TRACT | 0 refills | Status: DC
  Filled 2022-12-18: qty 13, 30d supply, fill #0

## 2022-12-18 MED ORDER — MIDODRINE HCL 10 MG PO TABS: 10.0000 mg | ORAL_TABLET | Freq: Three times a day (TID) | ORAL | 1 refills | Status: DC

## 2022-12-18 NOTE — Progress Notes (Signed)
Rounding Note    Patient Name: Anthony [name] Date of Encounter: 12/18/2022  [facility] Cardiologist: Verne Carrow, MD   Subjective   Wanted a PCP "I can get along with". Denies any palpitation, "only feel it when I am doing something". Wanted pain meds. No SOB.   Inpatient Medications    Scheduled Meds:  acetaminophen  1,000 mg Oral TID   [START ON 12/27/2022] amiodarone  200 mg Oral Daily   amiodarone  400 mg Oral BID   amoxicillin-clavulanate  1 tablet Oral Q12H   diltiazem  120 mg Oral Daily   feeding supplement  237 mL Oral BID BM   ferrous sulfate  325 mg Oral Q breakfast   folic acid  1 mg Oral Daily   lidocaine  1 patch Transdermal Q24H   loratadine  10 mg Oral Daily   magnesium oxide  400 mg Oral BID   methocarbamol  500 mg Oral TID   metoprolol succinate  25 mg Oral BID   midodrine  10 mg Oral TID WC   mirtazapine  15 mg Oral QHS   mometasone-formoterol  2 puff Inhalation BID   multivitamin with minerals  1 tablet Oral Daily   rivaroxaban  20 mg Oral Q supper   rosuvastatin  10 mg Oral Daily   Continuous Infusions:  PRN Meds: acetaminophen **OR** acetaminophen, antiseptic oral rinse, LORazepam, ondansetron **OR** ondansetron (ZOFRAN) IV, mouth rinse, polyvinyl alcohol, simethicone, traMADol   Vital Signs    Vitals:   12/17/22 2004 12/18/22 0543 12/18/22 0755 12/18/22 0824  BP: (!) 129/100 125/88  (!) 131/91  Pulse: 89 78  80  Resp: 20 20  18   Temp: 97.8 F (36.6 C) 97.8 F (36.6 C)  98 F (36.7 C)  TempSrc: Oral Oral  Oral  SpO2: 98% 100% 98% 99%  Weight:      Height:        Intake/Output Summary (Last 24 hours) at 12/18/2022 0959 Last data filed at 12/18/2022 0730 Gross per 24 hour  Intake 583.34 ml  Output 925 ml  Net -341.66 ml      12/15/2022    5:45 AM 12/02/2022    8:53 AM 10/10/2022    9:00 AM  Last 3 Weights  Weight (lbs) 148 lb 9.4 oz 133 lb 2.5 oz 147 lb 0.8 oz  Weight (kg) 67.4 kg 60.4 kg 66.7 kg       Telemetry    Atrial fibrillation with intermittent 2:1 atrial flutter - Personally Reviewed  ECG    Atrial flutter with RVR - Personally Reviewed  Physical Exam   GEN: No acute distress.   Neck: No JVD Cardiac: irregular, no murmurs, rubs, or gallops.  Respiratory: Clear to auscultation bilaterally. GI: Soft, nontender, non-distended  MS: No edema; No deformity. Neuro:  Nonfocal  Psych: Normal affect   Labs    High Sensitivity Troponin:   Recent Labs  Lab 12/02/22 0409 12/02/22 0858  TROPONINIHS 11 10     Chemistry Recent Labs  Lab 12/14/22 0344 12/15/22 0409 12/16/22 0417 12/17/22 1203  NA 137 135 137 136  K 4.1 4.3 3.9 4.1  CL 108 108 106 107  CO2 20* 20* 22 19*  GLUCOSE 70 59* 84 99  BUN 32* 29* 29* 30*  CREATININE 0.69 0.58* 0.64 0.85  CALCIUM 8.7* 9.0 9.2 9.2  MG 1.6* 1.8 1.9 1.9  PROT 7.3  --   --   --   ALBUMIN 3.4*  --   --   --  AST 33  --   --   --   ALT 31  --   --   --   ALKPHOS 54  --   --   --   BILITOT 0.5  --   --   --   GFRNONAA >60 >60 >60 >60  ANIONGAP 9 7 9 10     Lipids No results for input(s): "CHOL", "TRIG", "HDL", "LABVLDL", "LDLCALC", "CHOLHDL" in the last 168 hours.  Hematology Recent Labs  Lab 12/13/22 0425 12/14/22 0344 12/15/22 0409  WBC 7.1 6.6 7.5  RBC 2.88* 2.73* 2.80*  HGB 9.7* 9.3* 9.2*  HCT 31.3* 30.5* 30.6*  MCV 108.7* 111.7* 109.3*  MCH 33.7 34.1* 32.9  MCHC 31.0 30.5 30.1  RDW 20.4* 20.2* 19.9*  PLT 273 300 353   Thyroid No results for input(s): "TSH", "FREET4" in the last 168 hours.  BNPNo results for input(s): "BNP", "PROBNP" in the last 168 hours.  DDimer No results for input(s): "DDIMER" in the last 168 hours.   Radiology    No results found.  Cardiac Studies   Echo 08/28/2022 1. Left ventricular ejection fraction, by estimation, is 60 to 65%. The  left ventricle has normal function. The left ventricle has no regional  wall motion abnormalities. Left ventricular diastolic parameters are   indeterminate.   2. Right ventricular systolic function is normal. The right ventricular  size is mildly enlarged. Tricuspid regurgitation signal is inadequate for  assessing PA pressure.   3. The mitral valve is normal in structure. No evidence of mitral valve  regurgitation. No evidence of mitral stenosis.   4. The aortic valve was not well visualized. Aortic valve regurgitation  is not visualized. No aortic stenosis is present.   Patient Profile     67 y.o. male with PMH of chronic diastolic CHF, PAF, moderate CAD on CT 2022, unprovoked PE 2018, HTN with orthostatic hypotension, surgically corrected hydrocephalus, chronic back pain, alcohol abuse with prior DTs, noncompliance and homelessness who presented with alcohol abuse with alcohol induced mood disorder and hallucinations. Cardiology consulted for afib with RVR and hypotension. He was also found to have incidental acute small stroke.   Assessment & Plan    Paroxysmal atrial fibrillation with RVR  - previously had multiple admissions with afib with RVR, never followed up with cardiology service as outpatient  - TSH normal. Xarelto resumed.  - afib driven by EtOH abuse. He has noncompliance (does not take meds), psychiatric issues and social issues such as homelessness and poor insight into medical conditions  - rate control medication adjustment difficult due to orthostatic hypotension  - TEE DCCV initially considered but patient could not consent.   - loaded with IV amiodarone, transitioned to oral amiodarone, 400mg  BID for 10 days, then 200mg  daily thereafter. On toprol XL and cardizem. Unable to uptitrate due to orthostatic hypotension.  - doubt his medication can be further titrated. Will arrange outpatient follow up.   Incidental small stroke: seen by neurology. On Xarelto. Concerned about noncompliance  Orthostatic hypotension: improved with IV hydration and midodrine  Alchol abuse: discussed the importance of EtOH  cessation, however patient says he needs a alternative to EtOH if he was to quit.  Hallucination      For questions or updates, please contact [facility] Please consult [email or web address] for contact info under        Signed, Azalee Course, PA  12/18/2022, 9:59 AM

## 2022-12-18 NOTE — TOC Transition Note (Signed)
Transition of Care [facility]) - CM/SW Discharge Note   Patient Details  Name: Anthony [name] MRN: [id number] Date of Birth: Nov 04, 1955  [facility] CM/SW Contact:  Howell Rucks, RN Phone Number: 12/18/2022, 12:09 PM   Clinical Narrative:   DC order, pt to dc to [facility]. NCM outreached to [facility] x 3, vm left, no call back, taxi voucher provided to pt. Rollator in room. No further TOC needs identified.     Final next level of care: Homeless Shelter Barriers to Discharge: Barriers Resolved   Patient Goals and CMS Choice CMS Medicare.gov Compare Post Acute Care list provided to:: Patient Choice offered to / list presented to : Patient  Discharge Placement                         Discharge Plan and Services Additional resources added to the After Visit Summary for     Discharge Planning Services: CM Consult              DME Agency: [employer] Date DME Agency Contacted: 12/17/22 Time DME Agency Contacted: 1208 Representative spoke with at DME Agency: Mittie Bodo            Social Determinants of Health (SDOH) Interventions SDOH Screenings   Food Insecurity: Food Insecurity Present (12/11/2022)  Housing: High Risk (12/02/2022)  Transportation Needs: Unmet Transportation Needs (12/11/2022)  Utilities: Not At Risk (12/11/2022)  Recent Concern: Utilities - At Risk (12/02/2022)  Depression (PHQ2-9): Medium Risk (10/16/2020)  Tobacco Use: High Risk (12/11/2022)     Readmission Risk Interventions    10/12/2022    1:29 PM 08/28/2022    2:00 PM 08/27/2022   10:30 AM  Readmission Risk Prevention Plan  Transportation Screening Complete Complete Complete  Medication Review Oceanographer) Complete  Complete  PCP or Specialist appointment within 3-5 days of discharge Complete Complete Complete  HRI or Home Care Consult Complete  Complete  SW Recovery Care/Counseling Consult Complete Complete Complete  Palliative Care Screening Not Applicable  Not Applicable Not Applicable  Skilled Nursing Facility Not Applicable Not Applicable Not Applicable

## 2022-12-18 NOTE — Discharge Summary (Signed)
Physician Discharge Summary   Patient: Anthony [name] MRN: [id number] DOB: [birth year]  Admit date:     12/02/2022  Discharge date: 12/18/22  Discharge Physician: Anthony [name]   PCP: Pcp, No   Recommendations at discharge:   Needs follow up Bmet to monitor renal function.  Needs to monitor HR and orthostatic vitals. He was discharge on Midodrine, metoprolol and Cardizem. His A fib has been difficult to controlled. He was also discharge on Amiodarone.  He will need referral to Dentist  Encourage Alcohol cessation.   Discharge Diagnoses: Principal Problem:   Atrial fibrillation with RVR (HCC) Active Problems:   HTN (hypertension)   Paroxysmal atrial fibrillation (HCC)   Hypomagnesemia   Dyslipidemia   Macrocytic anemia   Alcohol abuse with alcohol-induced mood disorder (HCC)   Tobacco use disorder   Hyponatremia   Aortic atherosclerosis (HCC)   Abnormal LFTs   Stroke (cerebrum) (HCC)  Resolved Problems:   * No resolved hospital problems. [facility] Course: 67 year old, homeless, chronic alcohol use, anxiety, depression presented to the hospital with confusion. He was noted to have hallucination. Apparently he has not been taking his medication. He was found to be in A-fib with RVR in the ED and admitted to the hospital for further care. Cardiology and psychiatric consulted as well. He has been cleared by psych. His A-fib has been difficult to control due to orthostatic hypotension. He was a started on IV amiodarone on 5/28 . Subsequently transition to oral amiodarone. HR has been less than 120. Stable for discharge per cardiology   Assessment and Plan: 1-PAF with RVR: -Rate poorly controlled.  Difficulty adjusting medications due to orthostatic hypotension. -Unable to proceed with TEE cardioversion, patient unable to provide consent and he had a history of esophageal procedure and surgery remotely in the past. -Started on IV amiodarone on 5/28-- transition to oral amiodarone  5/30..  -Continue with metoprolol low-dose and Cardizem His HR has been stable. He has been clear for discharge by cardiology  On Xarelto.   2-Acute metabolic encephalopathy, hallucination: In the setting of alcohol use withdrawal Anthony [name] consulted and evaluated patient.  He was started on Remeron, dose increased 5/26 due to poor sleep at night. Has been cleared by psych.   3-Poor dentition: CT maxillofacial negative for abscess, did show multiple cavities -Will give a trial of Augmentin for 5 days.  He will need to follow-up with a dentist as an outpatient.   4-Ongoing alcohol abuse with alcohol-induced mood disorder: Completed CIWA protocol, thiamine and folic acid   5-Chronic pain: He will need follow-up with PCP and will need referral for pain clinic   6-Abnormal liver function test due to EtOH.  Improved   7-Incidental small CVA:  -noticed on the MRI on 5/16, which showed a punctate acute cortical/subcortical infarct in the left superior frontal gyrus without hemorrhage or mass effect.  -Neurology consulted, underwent CTA without LVO, 2D echo was not done due to being recently obtained couple of months ago.  A1c was 4.9, LDL 48.  Continue with  statin.   8-Essential  hypertension: Continue with diltiazem, metoprolol and midodrine   Hyperlipidemia:  on statins   BPH: Flomax on hold due to orthostatic hypotension.  Will need outpatient follow-up with urology Chronic orthostatic hypotension.  Started on midodrine.  Discontinue Flomax   Homelessness: Lives at [facility]. CM assisted patient with multiples social issues.  Hypomagnesemia replaced     Nutrition Problem: Moderate Malnutrition Etiology: chronic illness (etoh abuse)  Consultants: Cardiology  Procedures performed: none  Disposition: Home Diet recommendation:  Discharge Diet Orders (From admission, onward)     Start     Ordered   12/18/22 0000  Diet - low sodium heart healthy        12/18/22 0906            Cardiac diet DISCHARGE MEDICATION: Allergies as of 12/18/2022       Reactions   Other Itching, Other (See Comments)   Seasonal allergies- Itchy eyes, runny nose, congestion   Poison Ivy Extract Rash        Medication List     STOP taking these medications    diltiazem 60 MG 12 hr capsule Commonly known as: CARDIZEM SR   furosemide 20 MG tablet Commonly known as: LASIX   Magnesium Oxide 400 MG Caps Replaced by: magnesium oxide 400 (240 Mg) MG tablet   metoprolol tartrate 25 MG tablet Commonly known as: LOPRESSOR   Symbicort 160-4.5 MCG/ACT inhaler Generic drug: budesonide-formoterol Replaced by: Elwin Sleight 200-5 MCG/ACT Aero   tamsulosin 0.4 MG Caps capsule Commonly known as: Flomax       TAKE these medications    acetaminophen 500 MG tablet Commonly known as: TYLENOL Take 1 tablet (500 mg total) by mouth every 8 (eight) hours as needed. What changed:  how much to take when to take this reasons to take this   amiodarone 200 MG tablet Commonly known as: PACERONE Take 2 tablets (400 mg total) by mouth 2 (two) times daily for 10 days.   amiodarone 200 MG tablet Commonly known as: PACERONE Take 1 tablet (200 mg total) by mouth daily. Start taking on: December 27, 2022   amoxicillin-clavulanate 875-125 MG tablet Commonly known as: AUGMENTIN Take 1 tablet by mouth every 12 (twelve) hours for 3 days.   [employer] Pain Ex St 500-32.5 MG Tabs Generic drug: Aspirin-Caffeine Take 1-2 tablets by mouth every 6 (six) hours as needed (for pain).   Centrum Silver 50+Men Tabs Take 1 tablet by mouth every other day.   diltiazem 120 MG 24 hr capsule Commonly known as: CARDIZEM CD Take 1 capsule (120 mg total) by mouth daily. Start taking on: December 19, 2022   [facility] 200-5 MCG/ACT Aero Generic drug: mometasone-formoterol Inhale 2 puffs into the lungs 2 (two) times daily. Replaces: Symbicort 160-4.5 MCG/ACT inhaler   Ensure High Protein Liqd Take 237  mLs by mouth daily.   FeroSul 325 (65 FE) MG tablet Generic drug: ferrous sulfate Take 1 tablet (325 mg total) by mouth daily with breakfast.   folic acid 1 MG tablet Commonly known as: FOLVITE Take 1 tablet (1 mg total) by mouth daily.   loratadine 10 MG tablet Commonly known as: CLARITIN Take 1 tablet (10 mg total) by mouth daily. Start taking on: December 19, 2022   magnesium oxide 400 (240 Mg) MG tablet Commonly known as: MAG-OX Take 1 tablet (400 mg total) by mouth 2 (two) times daily. Replaces: Magnesium Oxide 400 MG Caps   methocarbamol 500 MG tablet Commonly known as: ROBAXIN Take 1 tablet (500 mg total) by mouth every 6 (six) hours as needed for muscle spasms.   metoprolol succinate 25 MG 24 hr tablet Commonly known as: TOPROL-XL Take 1 tablet (25 mg total) by mouth 2 (two) times daily.   midodrine 10 MG tablet Commonly known as: PROAMATINE Take 1 tablet (10 mg total) by mouth 3 (three) times daily with meals.   mirtazapine 15 MG tablet Commonly  known as: REMERON Take 1 tablet (15 mg total) by mouth at bedtime.   rosuvastatin 10 MG tablet Commonly known as: CRESTOR Take 1 tablet (10 mg total) by mouth daily.   Tylenol PM Extra Strength 25-500 MG Tabs tablet Generic drug: diphenhydramine-acetaminophen Take 2 tablets by mouth at bedtime as needed (for sleep).   Xarelto 20 MG Tabs tablet Generic drug: rivaroxaban Take 1 tablet (20 mg total) by mouth daily with supper.               Durable Medical Equipment  (From admission, onward)           Start     Ordered   12/08/22 1250  For home use only DME 4 wheeled rolling walker with seat  Once       Question:  Patient needs a walker to treat with the following condition  Answer:  Unsteady gait   12/08/22 1249            Follow-up Information     [facility]. Schedule an appointment as soon as possible for a visit in 8 week(s).   Specialty: Neurology Why: Patient  to be seen by stroke NP 8 weeks after discharge Contact information: [address] [place] [address] [phone]        [facility] Follow up.   Why: Technical sales engineer information: [address] [place] [address]  Phone: [phone]        [facility] Follow up.   Why: Appointment:   [facility] December 21, 2022 a 2:15pm Contact information: [address] [place] [address] [phone]        Gaston Islam., NP Follow up on 01/01/2023.   Specialty: Cardiology Why: 1:55PM. Cardiology post hospital follow up Contact information: [address] [place] [address] [phone]                Discharge Exam: Ceasar Mons Weights   12/02/22 0853 12/15/22 0545  Weight: 60.4 kg 67.4 kg   General; NAD  Condition at discharge: stable  The results of significant diagnostics from this hospitalization (including imaging, microbiology, ancillary and laboratory) are listed below for reference.   Imaging Studies: CT MAXILLOFACIAL WO CONTRAST  Result Date: 12/15/2022 CLINICAL DATA:  Multiple dental caries.  Severe pain. EXAM: CT MAXILLOFACIAL WITHOUT CONTRAST TECHNIQUE: Multidetector CT imaging of the maxillofacial structures was performed. Multiplanar CT image reconstructions were also generated. RADIATION DOSE REDUCTION: This exam was performed according to the departmental dose-optimization program which includes automated exposure control, adjustment of the mA and/or kV according to patient size and/or use of iterative reconstruction technique. COMPARISON:  None Available. FINDINGS: Osseous: Extensive dental caries of the remaining maxillary and mandibular teeth with periapical lucencies of the bilateral central and left maxillary incisors, as well as the bilateral mandibular lateral incisors and right mandibular canine. Severe degenerative changes of  the cervical spine with at least mild spinal canal stenosis at C3-4. Orbits: Negative. No traumatic or inflammatory finding. Sinuses: Well aerated. Soft tissues: Atherosclerotic calcifications of the carotid bulbs. Limited intracranial: Unremarkable. IMPRESSION: 1. Extensive dental caries of the remaining maxillary and mandibular teeth with periapical lucencies of the bilateral central and left maxillary incisors, as well as the bilateral mandibular lateral incisors and right mandibular canine. 2. Severe degenerative changes of the cervical spine with at least mild spinal canal stenosis at C3-4. Electronically Signed  By: Orvan Falconer M.D.   On: 12/15/2022 14:15   CT ANGIO HEAD NECK W WO CM  Result Date: 12/03/2022 CLINICAL DATA:  Stroke/TIA, determine embolic source. EXAM: CT ANGIOGRAPHY HEAD AND NECK WITH AND WITHOUT CONTRAST TECHNIQUE: Multidetector CT imaging of the head and neck was performed using the standard protocol during bolus administration of intravenous contrast. Multiplanar CT image reconstructions and MIPs were obtained to evaluate the vascular anatomy. Carotid stenosis measurements (when applicable) are obtained utilizing NASCET criteria, using the distal internal carotid diameter as the denominator. RADIATION DOSE REDUCTION: This exam was performed according to the departmental dose-optimization program which includes automated exposure control, adjustment of the mA and/or kV according to patient size and/or use of iterative reconstruction technique. CONTRAST:  75mL OMNIPAQUE IOHEXOL 350 MG/ML SOLN COMPARISON:  Brain MRI from earlier today FINDINGS: CT HEAD FINDINGS Brain: No evidence of visible acute infarction, hemorrhage, hydrocephalus, extra-axial collection or mass lesion/mass effect. Brain atrophy especially affecting the cerebellum. Vascular: See below Skull: Normal. Negative for fracture or focal lesion. Sinuses/Orbits: No acute finding. Review of the MIP images confirms the above  findings CTA NECK FINDINGS Aortic arch: 3 vessel branching Right carotid system: Atheromatous calcification at the carotid bifurcation without stenosis or ulceration. Left carotid system: Atheromatous plaque at the bifurcation without stenosis or ulceration. Vertebral arteries: No proximal subclavian stenosis. The right vertebral artery is dominant. Vertebral arteries are smoothly contoured and widely patent Skeleton: No acute finding. Generalized degeneration of the cervical spine with C3-4 anterolisthesis. Other neck: Negative Upper chest: Calcified granuloma in the right upper lobe. Review of the MIP images confirms the above findings CTA HEAD FINDINGS Anterior circulation: No branch occlusion, beading, or flow limiting stenosis. Motion artifact above the level of the circle-of-Willis. Left MCA bifurcation aneurysm projecting anteriorly and measuring 2 mm. No evidence of vascular malformation. Posterior circulation: The vertebral and basilar arteries are smoothly contoured and widely patent. No branch occlusion, beading, aneurysm, or vascular malformation. Venous sinuses: Diffusely patent Anatomic variants: None significant Review of the MIP images confirms the above findings IMPRESSION: 1. No emergent arterial finding. 2. Atherosclerosis without flow limiting stenosis of major arteries in the head and neck. 3. 2 mm left MCA bifurcation aneurysm. Electronically Signed   By: Tiburcio Pea M.D.   On: 12/03/2022 15:39   MR BRAIN WO CONTRAST  Result Date: 12/03/2022 CLINICAL DATA:  67 year old male altered mental status. EXAM: MRI HEAD WITHOUT CONTRAST TECHNIQUE: Multiplanar, multiecho pulse sequences of the brain and surrounding structures were obtained without intravenous contrast. COMPARISON:  Brain MRI 08/16/2020.  Head CT 10/06/2022. FINDINGS: Brain: Tiny cortical or subcortical focus of diffusion restriction at the left superior frontal gyrus series 5, image 41, correlated on coronal series 13, image 12  DWI. No T2 or FLAIR hyperintensity. No other diffusion restriction. No midline shift, mass effect, evidence of mass lesion, ventriculomegaly, extra-axial collection or acute intracranial hemorrhage. Cervicomedullary junction and pituitary are within normal limits. Cerebral volume is stable since 2022. Outside of the acute finding stable minimal to mild nonspecific white matter T2 and FLAIR hyperintensity in both hemispheres. No cortical encephalomalacia. No chronic cerebral blood products on SWI. However, there is questionable chronic atrophy of the cerebellar vermis. Vascular: Major intracranial vascular flow voids are preserved, stable since 2022. Skull and upper cervical spine: Cervical spine degeneration including C3-C4 spondylolisthesis and mild spinal stenosis on series 7, image 12. Visualized bone marrow signal is within normal limits. Sinuses/Orbits: Stable, negative. Other: Mastoids are clear. Visible internal auditory structures appear  normal. Negative visible scalp and face. IMPRESSION: 1. Positive for a punctate Acute cortical/subcortical Infarct in the Left superior frontal gyrus (motor/pre motor area). No associated hemorrhage or mass effect. Query right side weakness. 2. Otherwise stable since 2022 and largely negative for age noncontrast MRI appearance of the brain; questionable chronic atrophy of the cerebellar vermis. 3. 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 Electronically Signed   By: Odessa Fleming M.D.   On: 12/03/2022 06:35   EEG adult  Result Date: 12/02/2022 Charlsie Quest, MD     12/02/2022  5:58 PM Patient Name: JONCARLO TUCCIARONE MRN: [id number] Epilepsy Attending: Charlsie Quest Referring Physician/Provider: Bobette Mo, MD Date: 12/02/2022 Duration: 24.30 mins Patient history: 67yo m with ams getting eeg to evaluate for seizure Level of alertness: Awake AEDs during EEG study: None Technical aspects: This EEG study was done with scalp  electrodes positioned according to the 10-20 International system of electrode placement. Electrical activity was reviewed with band pass filter of 1-70Hz , sensitivity of 7 uV/mm, display speed of 17mm/sec with a 60Hz  notched filter applied as appropriate. EEG data were recorded continuously and digitally stored.  Video monitoring was available and reviewed as appropriate. Description: EEG showed continuous generalized 3 to 6 Hz theta-delta slowing. Hyperventilation and photic stimulation were not performed.   Of note, study was technically difficult due to significant electrode artifact secondary to matted hair  ABNORMALITY - Continuous slow, generalized IMPRESSION: This technically difficult study is  suggestive of moderate diffuse encephalopathy, nonspecific etiology. No seizures or epileptiform discharges were seen throughout the recording. Charlsie Quest   DG Skull 1-3 Views  Result Date: 12/02/2022 CLINICAL DATA:  MRI clearance EXAM: SKULL - 1-3 VIEW COMPARISON:  CT head 10/06/2022 FINDINGS: There is no evidence of skull fracture or other focal bone lesions. No metallic foreign bodies are demonstrated. Paranasal sinuses are clear. IMPRESSION: No metallic foreign bodies are demonstrated. Electronically Signed   By: Burman Nieves M.D.   On: 12/02/2022 17:25   DG Abd 1 View  Result Date: 12/02/2022 CLINICAL DATA:  Metal screening prior MRI. EXAM: ABDOMEN - 1 VIEW COMPARISON:  None Available. FINDINGS: There surgical clips and staple line seen in the left upper quadrant. There is likely pen external artifact overlying the right hip. There are vascular calcifications in the pelvis. Bowel-gas pattern is nonobstructive. No fractures are seen. IMPRESSION: 1.  Surgical clips and staple line in the left upper quadrant. 2. Pen artifact overlying the right hip. Please correlate clinically. Electronically Signed   By: Darliss Cheney M.D.   On: 12/02/2022 17:25   DG Chest 2 View  Result Date:  12/02/2022 CLINICAL DATA:  67 year old male with shortness of breath. Atrial fibrillation. EXAM: CHEST - 2 VIEW COMPARISON:  Portable chest 10/10/2022 and earlier. FINDINGS: Semi upright AP and lateral views of the chest at 0440 hours. Lung volumes and mediastinal contours are stable and within normal limits. No cardiomegaly. Visualized tracheal air column is within normal limits. No pneumothorax, pulmonary edema, pleural effusion or confluent lung opacity. Chronic distal right clavicle deformity. No acute osseous abnormality identified. Epigastric surgical clips are chronic. Negative visible bowel gas. IMPRESSION: No acute cardiopulmonary abnormality. Electronically Signed   By: Odessa Fleming M.D.   On: 12/02/2022 05:37    Microbiology: Results for orders placed or performed during the hospital encounter of 10/06/22  MRSA Next Gen by PCR, Nasal     Status: None   Collection Time: 10/08/22  8:51  PM   Specimen: Nasal Mucosa; Nasal Swab  Result Value Ref Range Status   MRSA by PCR Next Gen NOT DETECTED NOT DETECTED Final    Comment: (NOTE) The GeneXpert MRSA Assay (FDA approved for NASAL specimens only), is one component of a comprehensive MRSA colonization surveillance program. It is not intended to diagnose MRSA infection nor to guide or monitor treatment for MRSA infections. Test performance is not FDA approved in patients less than 90 years old. Performed at [facility], [address] [place], [place] [address]     Labs: CBC: Recent Labs  Lab 12/12/22 0445 12/13/22 0425 12/14/22 0344 12/15/22 0409  WBC 4.7 7.1 6.6 7.5  HGB 9.6* 9.7* 9.3* 9.2*  HCT 31.0* 31.3* 30.5* 30.6*  MCV 108.8* 108.7* 111.7* 109.3*  PLT 269 273 300 353   Basic Metabolic Panel: Recent Labs  Lab 12/12/22 0445 12/14/22 0344 12/15/22 0409 12/16/22 0417 12/17/22 1203  NA 137 137 135 137 136  K 4.7 4.1 4.3 3.9 4.1  CL 109 108 108 106 107  CO2 20* 20* 20* 22 19*  GLUCOSE 90 70 59* 84 99  BUN 23 32*  29* 29* 30*  CREATININE 0.75 0.69 0.58* 0.64 0.85  CALCIUM 8.6* 8.7* 9.0 9.2 9.2  MG 1.8 1.6* 1.8 1.9 1.9   Liver Function Tests: Recent Labs  Lab 12/14/22 0344  AST 33  ALT 31  ALKPHOS 54  BILITOT 0.5  PROT 7.3  ALBUMIN 3.4*   CBG: Recent Labs  Lab 12/15/22 0603  GLUCAP 87    Discharge time spent: greater than 30 minutes.  Signed: Alba Cory, MD [facility] 12/18/2022

## 2022-12-18 NOTE — Plan of Care (Signed)
  Problem: Activity: Goal: Ability to tolerate increased activity will improve Outcome: Progressing   Problem: Clinical Measurements: Goal: Cardiovascular complication will be avoided Outcome: Progressing   

## 2022-12-21 ENCOUNTER — Ambulatory Visit: Payer: 59 | Admitting: Student

## 2022-12-31 NOTE — Progress Notes (Deleted)
Office Visit    Patient Name: Anthony [name] Date of Encounter: 12/31/2022  Primary Care Provider:  Pcp, No Primary Cardiologist:  Verne Carrow, MD Primary Electrophysiologist: None   Past Medical History    Past Medical History:  Diagnosis Date   Actinic keratosis 11/27/2016   Alcohol abuse with alcohol-induced mood disorder (HCC) 07/18/2018   Alcohol use disorder, severe, dependence (HCC) 09/16/2006   05/22/2015 Argumentative, belligerent and verbally abusive to staff per his note from [place]    Alcoholism [facility])    Anxiety state 04/25/2018   Aortic atherosclerosis (HCC) 08/27/2022   Chronic low back pain 09/16/2006   Followed by NS.  Per his chart from [place], he fell off two storyhouse roof while cleaning his brother's gutters and sustained compression fracture of L3, L4 and L5 and fracture of right transverse process at L3 and nondisplaced fracture of left glenoid rim many years ago. He had multiple imaging in [place] including Lumbar MRI in 2016 which showed moderate to severe spinal canal stenos   Delirium tremens (HCC) 09/01/2017   Depression    Dry eye 11/23/2016   Foreign body in middle portion of esophagus 05/19/2022   Hiatal hernia 09/14/2016   Status Collis gastroplasty and Nissen's fundoplication on 04/29/2015 in [place]   History of delirium tremons 07/22/2020   DTs during 10/2016 08/2017. And 12/2017 admissions    History of pulmonary embolus (PE) 11/02/2016   Unprovoked 11/01/2016. Xarelto from 11/01/16 to 09/08/2017.   Hydrocele    Surgically corrected   Hypertension    Hypoalbuminemia due to protein-calorie malnutrition (HCC) 07/22/2020   Lumbar compression fracture (HCC)    Multiple rib fractures 07/22/2019   Left rib details XR: left ribs demonstrate multiple remote rib   Pancytopenia (HCC)    Rhabdomyolysis 07/22/2020   Skin cancer    exciced 2017   Tinea versicolor 06/08/2013   Tobacco use disorder 07/22/2020   Tooth  infection 04/25/2018   Trigger finger, acquired 11/18/2012   Ulnar tunnel syndrome of right wrist 11/08/2012   Past Surgical History:  Procedure Laterality Date   BIOPSY  05/21/2022   Procedure: BIOPSY;  Surgeon: Lynann Bologna, MD;  Location: [facility] ENDOSCOPY;  Service: Gastroenterology;;   CATARACT EXTRACTION     ESOPHAGOGASTRODUODENOSCOPY (EGD) WITH PROPOFOL N/A 05/21/2022   Procedure: ESOPHAGOGASTRODUODENOSCOPY (EGD) WITH PROPOFOL;  Surgeon: Lynann Bologna, MD;  Location: [facility] ENDOSCOPY;  Service: Gastroenterology;  Laterality: N/A;   FOREIGN BODY REMOVAL N/A 05/21/2022   Procedure: FOREIGN BODY REMOVAL;  Surgeon: Lynann Bologna, MD;  Location: [facility] ENDOSCOPY;  Service: Gastroenterology;  Laterality: N/A;   HIATAL HERNIA REPAIR  2016   HYDROCELE EXCISION / REPAIR  2010   SKIN CANCER EXCISION Left    Excised 2017    Allergies  Allergies  Allergen Reactions   Other Itching and Other (See Comments)    Seasonal allergies- Itchy eyes, runny nose, congestion   Poison Ivy Extract Rash     History of Present Illness    Anthony [name]  is a 67 year old male with a PMH of moderate CAD, PAF (on Xarelto) HFpEF, poor dentition, orthostatic hypotension unprovoked PE 2018, HTN, EtOH abuse who presents today for post hospital follow-up.   Mr. Deeds was seen initially in 1/5 2022 for consultation in the ED for atrial fibrillation.  He was found to be in detox from EtOH with complications of altered mental status and new onset AF.  He was started on Xarelto and metoprolol for rate control.  He was admitted again 09/30/2020 with DOE with urosepsis and echo completed showing EF of 60% with grade 2 DD.  BNP was found to be mildly elevated CT of the chest showed mild atelectasis with no evidence of PE.  He underwent a coronary CTA that showed calcium score of 884 with nonobstructive plaque in coronaries with CT FFR showing no evidence of significant stenosis.  He was started on Imdur and rosuvastatin for moderate  intensity statin.  He was discharged to a SNF with amiodarone 200 mg daily.  He was seen again in the ED 05/19/2022 for evaluation of AF with RVR.  He was treated with amiodarone drip and transition to low-dose beta-blocker prior to discharge.  He presented to the ED 09/14/2022 again for evaluation of AF with RVR in the setting of diarrhea and weakness.  He was found positive for C. difficile and started on antibiotics.  BNP was found to be 484 and troponins were negative x 2.  He was diuresed with Lasix drip and converted to on Cardizem drip to sinus rhythm.  He was admitted earlier that month with aspiration pneumonia and alcohol withdrawal delirium with orthostatic hypotension.  He was started on midodrine with EF of 60-65% and no significant valve disease.   He was admitted 12/02/2022 with shortness of breath and suffering from hallucinations.  BNP was 341 and AKI with creatinine at 1.3 with abnormal LFTs.  He was found to be in AF with RVR and incidental findings of small acute CVA were noted.  Plan was made for possible TEE/DCCV but patient is unable to consent this time.  Since last being seen in the office patient reports***.  Patient denies chest pain, palpitations, dyspnea, PND, orthopnea, nausea, vomiting, dizziness, syncope, edema, weight gain, or early satiety.     ***Notes:  Home Medications    Current Outpatient Medications  Medication Sig Dispense Refill   acetaminophen (TYLENOL) 500 MG tablet Take 1 tablet (500 mg total) by mouth every 8 (eight) hours as needed. (Patient taking differently: Take 1,000 mg by mouth every 6 (six) hours as needed (for pain).) 30 tablet 0   amiodarone (PACERONE) 200 MG tablet Take 1 tablet (200 mg total) by mouth daily. 30 tablet 0   amiodarone (PACERONE) 200 MG tablet Take 2 tablets (400 mg total) by mouth 2 (two) times daily for 10 days. 40 tablet 0   BAYER BACK & BODY PAIN EX ST 500-32.5 MG TABS Take 1-2 tablets by mouth every 6 (six) hours as needed  (for pain).     diltiazem (CARDIZEM CD) 120 MG 24 hr capsule Take 1 capsule (120 mg total) by mouth daily. 30 capsule 1   ferrous sulfate 325 (65 FE) MG tablet Take 1 tablet (325 mg total) by mouth daily with breakfast. 30 tablet 0   folic acid (FOLVITE) 1 MG tablet Take 1 tablet (1 mg total) by mouth daily. 30 tablet 0   loratadine (CLARITIN) 10 MG tablet Take 1 tablet (10 mg total) by mouth daily. 30 tablet 0   magnesium oxide (MAG-OX) 400 (240 Mg) MG tablet Take 1 tablet (400 mg total) by mouth 2 (two) times daily. 60 tablet 0   methocarbamol (ROBAXIN) 500 MG tablet Take 1 tablet (500 mg total) by mouth every 6 (six) hours as needed for muscle spasms. 40 tablet 0   metoprolol succinate (TOPROL-XL) 25 MG 24 hr tablet Take 1 tablet (25 mg total) by mouth 2 (two) times daily. 60 tablet 2   midodrine (  PROAMATINE) 10 MG tablet Take 1 tablet (10 mg total) by mouth 3 (three) times daily with meals. 120 tablet 1   mirtazapine (REMERON) 15 MG tablet Take 1 tablet (15 mg total) by mouth at bedtime. 30 tablet 0   mometasone-formoterol (DULERA) 200-5 MCG/ACT AERO Inhale 2 puffs into the lungs 2 (two) times daily. 13 g 0   Multiple Vitamins-Minerals (CENTRUM SILVER 50+MEN) TABS Take 1 tablet by mouth every other day. 30 tablet 0   Nutritional Supplements (ENSURE HIGH PROTEIN) LIQD Take 237 mLs by mouth daily.     rivaroxaban (XARELTO) 20 MG TABS tablet Take 1 tablet (20 mg total) by mouth daily with supper. 30 tablet 2   rosuvastatin (CRESTOR) 10 MG tablet Take 1 tablet (10 mg total) by mouth daily. 30 tablet 0   TYLENOL PM EXTRA STRENGTH 500-25 MG TABS tablet Take 2 tablets by mouth at bedtime as needed (for sleep).     No current facility-administered medications for this visit.     Review of Systems  Please see the history of present illness.    (+)*** (+)***  All other systems reviewed and are otherwise negative except as noted above.  Physical Exam    Wt Readings from Last 3 Encounters:   12/15/22 148 lb 9.4 oz (67.4 kg)  10/10/22 147 lb 0.8 oz (66.7 kg)  10/06/22 150 lb (68 kg)   UJ:WJXBJ were no vitals filed for this visit.,There is no height or weight on file to calculate BMI.  Constitutional:      Appearance: Healthy appearance. Not in distress.  Neck:     Vascular: JVD normal.  Pulmonary:     Effort: Pulmonary effort is normal.     Breath sounds: No wheezing. No rales. Diminished in the bases Cardiovascular:     Normal rate. Regular rhythm. Normal S1. Normal S2.      Murmurs: There is no murmur.  Edema:    Peripheral edema absent.  Abdominal:     Palpations: Abdomen is soft non tender. There is no hepatomegaly.  Skin:    General: Skin is warm and dry.  Neurological:     General: No focal deficit present.     Mental Status: Alert and oriented to person, place and time.     Cranial Nerves: Cranial nerves are intact.  EKG/LABS/ Recent Cardiac Studies    ECG personally reviewed by me today - ***  Cardiac Studies & Procedures       ECHOCARDIOGRAM  ECHOCARDIOGRAM COMPLETE 08/28/2022  Narrative ECHOCARDIOGRAM REPORT    Patient Name:   Jlynn A Laramie Date of Exam: 08/28/2022 Medical Rec #:  [id number]    Height:       70.0 in Accession #:    [id number]   Weight:       134.0 lb Date of Birth:  April 08, 1956    BSA:          1.761 m Patient Age:    66 years     BP:           99/72 mmHg Patient Gender: M            HR:           79 bpm. Exam Location:  Inpatient  Procedure: 2D Echo, Cardiac Doppler, Color Doppler and Intracardiac Opacification Agent  Indications:    I50.40* Unspecified combined systolic (congestive) and diastolic (congestive) heart failure  History:        Patient has prior history of Echocardiogram examinations, most  recent 03/17/2022. Signs/Symptoms:Altered Mental Status, Dyspnea and Shortness of Breath; Risk Factors:Hypertension and Current Smoker. ETOH.  Sonographer:    Sheralyn Boatman RDCS Referring Phys: [phone] ANAND D  HONGALGI   Sonographer Comments: Technically difficult study due to poor echo windows, suboptimal parasternal window, suboptimal apical window and suboptimal subcostal window. Image acquisition challenging due to uncooperative patient and Image acquisition challenging due to patient behavioral factors. Patient in restraints attempting to get up during study. Patient supine and could not turn. IMPRESSIONS   1. Left ventricular ejection fraction, by estimation, is 60 to 65%. The left ventricle has normal function. The left ventricle has no regional wall motion abnormalities. Left ventricular diastolic parameters are indeterminate. 2. Right ventricular systolic function is normal. The right ventricular size is mildly enlarged. Tricuspid regurgitation signal is inadequate for assessing PA pressure. 3. The mitral valve is normal in structure. No evidence of mitral valve regurgitation. No evidence of mitral stenosis. 4. The aortic valve was not well visualized. Aortic valve regurgitation is not visualized. No aortic stenosis is present.  FINDINGS Left Ventricle: Left ventricular ejection fraction, by estimation, is 60 to 65%. The left ventricle has normal function. The left ventricle has no regional wall motion abnormalities. Definity contrast agent was given IV to delineate the left ventricular endocardial borders. The left ventricular internal cavity size was normal in size. There is no left ventricular hypertrophy. Left ventricular diastolic parameters are indeterminate.  Right Ventricle: The right ventricular size is mildly enlarged. Right vetricular wall thickness was not well visualized. Right ventricular systolic function is normal. Tricuspid regurgitation signal is inadequate for assessing PA pressure.  Left Atrium: Left atrial size was normal in size.  Right Atrium: Right atrial size was normal in size.  Pericardium: There is no evidence of pericardial effusion. Presence of epicardial fat  layer.  Mitral Valve: The mitral valve is normal in structure. No evidence of mitral valve regurgitation. No evidence of mitral valve stenosis.  Tricuspid Valve: The tricuspid valve is normal in structure. Tricuspid valve regurgitation is trivial.  Aortic Valve: The aortic valve was not well visualized. Aortic valve regurgitation is not visualized. No aortic stenosis is present.  Pulmonic Valve: The pulmonic valve was not well visualized. Pulmonic valve regurgitation is not visualized.  Aorta: The aortic root is normal in size and structure.  IAS/Shunts: The interatrial septum was not well visualized.   LEFT VENTRICLE PLAX 2D LVIDd:         4.40 cm LVIDs:         3.70 cm LV PW:         0.90 cm LV IVS:        0.70 cm LVOT diam:     2.50 cm LV SV:         46 LV SV Index:   26 LVOT Area:     4.91 cm  LV Volumes (MOD) LV vol s, MOD A4C: 53.0 ml  RIGHT VENTRICLE RV S prime:     12.20 cm/s TAPSE (M-mode): 1.3 cm  LEFT ATRIUM           Index        RIGHT ATRIUM           Index LA diam:      3.90 cm 2.21 cm/m   RA Area:     14.10 cm LA Vol (A2C): 9.9 ml  5.62 ml/m   RA Volume:   29.80 ml  16.92 ml/m LA Vol (A4C): 46.6 ml 26.46 ml/m AORTIC VALVE  LVOT Vmax:   59.30 cm/s LVOT Vmean:  37.400 cm/s LVOT VTI:    0.094 m  AORTA Ao Root diam: 3.70 cm  MITRAL VALVE MV Area (PHT): 3.50 cm    SHUNTS MV Decel Time: 217 msec    Systemic VTI:  0.09 m MV E velocity: 48.30 cm/s  Systemic Diam: 2.50 cm MV A velocity: 41.15 cm/s MV E/A ratio:  1.17  Epifanio Lesches MD Electronically signed by Epifanio Lesches MD Signature Date/Time: 08/28/2022/5:01:33 PM    Final     CT SCANS  CT CORONARY MORPH W/CTA COR W/SCORE 10/02/2020  Addendum 10/02/2020  6:01 PM ADDENDUM REPORT: 10/02/2020 17:59  CLINICAL DATA:  67 Year-old White Male  EXAM: Cardiac/Coronary  CTA  TECHNIQUE: The patient was scanned on a [employer].  FINDINGS: A 100 kV prospective scan  was triggered in the descending thoracic aorta at 111 HU's. Axial non-contrast 3 mm slices were carried out through the heart. The data set was analyzed on a dedicated work station and scored using the Agatson method. Gantry rotation speed was 250 msecs and collimation was .6 mm. No beta blockade and 0.8 mg of sl NTG was given. The 3D data set was reconstructed in 5% intervals of the 67-82 % of the R-R cycle. Diastolic phases were analyzed on a dedicated work station using MPR, MIP and VRT modes. The patient received 80 cc of contrast.  Aorta:  Normal size.  No calcifications.  No dissection.  Aortic Valve:  Tri-leaflet.  Mild annular calcification.  Coronary Arteries:  Normal coronary origin.  Right dominance.  Coronary calcium score of 884. This was 91st percentile for age, sex, and race matched control.  RCA is a large dominant artery that gives rise to PDA and PLA. There is no plaque.  Left main is a large artery that gives rise to LAD and LCX arteries. There is a minimal calcification from the annulus that abuts the ostium of the left main.  LAD is a large vessel that gives rise to one large D1 Branch. There is a minimal non-obstructive (1-24%) calcified plaque in the diagonal vessel. There are tandem moderate stenoses (50-70%) calcified plaques in the mid vessel. There are tandem calcified plaques in the distal vessel (mild non-obstructive 25-49% and moderate stenosis 50-70%).  LCX is a non-dominant artery that gives rise to one large OM1 branch that bifurcates. There is a minimal non-obstructive (1-24%) soft plaque in the proximal vessel.  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  Normal left atrial appendage without a thrombus.  Normal size of the pulmonary artery.  Extra-cardiac findings: See attached radiology report for non-cardiac structures.  IMPRESSION: 1. Coronary calcium score  of 884. This was 91st percentile for age, sex, and race matched control.  2. Normal coronary origin.  Right dominance.  3. CAD-RADS 3. Moderate stenosis. Consider symptom-guided anti-ischemic pharmacotherapy as well as risk factor modification per guideline directed care. Additional analysis with CT FFR will be submitted.  4. 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   Electronically Signed By: Riley Lam MD On: 10/02/2020 17:59  Narrative EXAM: OVER-READ INTERPRETATION  CT CHEST  The following report is an over-read performed by radiologist Dr. Trudie Reed of [facility] on 10/02/2020. This over-read does not include interpretation of cardiac or coronary anatomy or pathology. The coronary calcium score/coronary CTA interpretation by the cardiologist is  attached.  COMPARISON:  None.  FINDINGS: Aortic atherosclerosis. Patchy areas of ground-glass attenuation are noted in the lungs, most evident in the left upper lobe within the visualized portions of the thorax there are no suspicious appearing pulmonary nodules or masses, there is no confluent consolidative airspace disease, no pleural effusions, no pneumothorax and no lymphadenopathy. Visualized portions of the upper abdomen demonstrate postoperative changes at the gastroesophageal junction. There are no aggressive appearing lytic or blastic lesions noted in the visualized portions of the skeleton.  IMPRESSION: 1. Patchy ground-glass attenuation in the lungs, most evident in the left upper lobe. 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 2.  Aortic Atherosclerosis (ICD10-I70.0).  Electronically Signed: By: Trudie Reed M.D. On: 10/02/2020 16:22          Risk Assessment/Calculations:   {Does this patient have ATRIAL  FIBRILLATION?:[phone]}        Lab Results  Component Value Date   WBC 7.5 12/15/2022   HGB 9.2 (L) 12/15/2022   HCT 30.6 (L) 12/15/2022   MCV 109.3 (H) 12/15/2022   PLT 353 12/15/2022   Lab Results  Component Value Date   CREATININE 0.85 12/17/2022   BUN 30 (H) 12/17/2022   NA 136 12/17/2022   K 4.1 12/17/2022   CL 107 12/17/2022   CO2 19 (L) 12/17/2022   Lab Results  Component Value Date   ALT 31 12/14/2022   AST 33 12/14/2022   ALKPHOS 54 12/14/2022   BILITOT 0.5 12/14/2022   Lab Results  Component Value Date   CHOL 121 12/04/2022   HDL 60 12/04/2022   LDLCALC 48 12/04/2022   TRIG 63 12/04/2022   CHOLHDL 2.0 12/04/2022    Lab Results  Component Value Date   HGBA1C 4.9 12/04/2022     Assessment & Plan    1.  Paroxysmal atrial fibrillation  2.  History of CVA  3.  Orthostatic hypotension  4.  EtOH abuse      Disposition: Follow-up with Verne Carrow, MD or APP in *** months {Are you ordering a CV Procedure (e.g. stress test, cath, DCCV, TEE, etc)?   Press F2        :[id number]}   Medication Adjustments/Labs and Tests Ordered: Current medicines are reviewed at length with the patient today.  Concerns regarding medicines are outlined above.   Signed, Napoleon Form, Leodis Rains, NP 12/31/2022, 12:05 PM [facility]

## 2023-01-01 ENCOUNTER — Ambulatory Visit: Payer: 59 | Attending: Nurse Practitioner | Admitting: Nurse Practitioner

## 2023-01-01 DIAGNOSIS — I480 Paroxysmal atrial fibrillation: Secondary | ICD-10-CM

## 2023-01-06 ENCOUNTER — Emergency Department (HOSPITAL_COMMUNITY): Payer: 59

## 2023-01-06 ENCOUNTER — Emergency Department (HOSPITAL_COMMUNITY)
Admission: EM | Admit: 2023-01-06 | Discharge: 2023-01-07 | Disposition: A | Discharge status: Home / Self Care | Payer: 59 | Source: Home / Self Care | Attending: Emergency Medicine | Admitting: Emergency Medicine

## 2023-01-06 ENCOUNTER — Emergency Department (HOSPITAL_COMMUNITY)
Admission: EM | Admit: 2023-01-06 | Discharge: 2023-01-06 | Disposition: A | Discharge status: Home / Self Care | Payer: 59 | Attending: Student | Admitting: Student

## 2023-01-06 ENCOUNTER — Other Ambulatory Visit: Payer: Self-pay

## 2023-01-06 DIAGNOSIS — R000 Tachycardia, unspecified: Secondary | ICD-10-CM | POA: Diagnosis not present

## 2023-01-06 DIAGNOSIS — D696 Thrombocytopenia, unspecified: Secondary | ICD-10-CM | POA: Diagnosis not present

## 2023-01-06 DIAGNOSIS — I110 Hypertensive heart disease with heart failure: Secondary | ICD-10-CM | POA: Insufficient documentation

## 2023-01-06 DIAGNOSIS — Z720 Tobacco use: Secondary | ICD-10-CM | POA: Insufficient documentation

## 2023-01-06 DIAGNOSIS — E871 Hypo-osmolality and hyponatremia: Secondary | ICD-10-CM | POA: Diagnosis not present

## 2023-01-06 DIAGNOSIS — F10229 Alcohol dependence with intoxication, unspecified: Secondary | ICD-10-CM | POA: Insufficient documentation

## 2023-01-06 DIAGNOSIS — W1830XA Fall on same level, unspecified, initial encounter: Secondary | ICD-10-CM | POA: Diagnosis not present

## 2023-01-06 DIAGNOSIS — S42002D Fracture of unspecified part of left clavicle, subsequent encounter for fracture with routine healing: Secondary | ICD-10-CM | POA: Insufficient documentation

## 2023-01-06 DIAGNOSIS — Z79899 Other long term (current) drug therapy: Secondary | ICD-10-CM | POA: Insufficient documentation

## 2023-01-06 DIAGNOSIS — Z8673 Personal history of transient ischemic attack (TIA), and cerebral infarction without residual deficits: Secondary | ICD-10-CM | POA: Insufficient documentation

## 2023-01-06 DIAGNOSIS — I5032 Chronic diastolic (congestive) heart failure: Secondary | ICD-10-CM | POA: Diagnosis not present

## 2023-01-06 DIAGNOSIS — X58XXXD Exposure to other specified factors, subsequent encounter: Secondary | ICD-10-CM | POA: Insufficient documentation

## 2023-01-06 DIAGNOSIS — E860 Dehydration: Secondary | ICD-10-CM | POA: Diagnosis not present

## 2023-01-06 DIAGNOSIS — S42002A Fracture of unspecified part of left clavicle, initial encounter for closed fracture: Secondary | ICD-10-CM | POA: Insufficient documentation

## 2023-01-06 DIAGNOSIS — I480 Paroxysmal atrial fibrillation: Secondary | ICD-10-CM | POA: Diagnosis not present

## 2023-01-06 DIAGNOSIS — M25512 Pain in left shoulder: Secondary | ICD-10-CM | POA: Insufficient documentation

## 2023-01-06 DIAGNOSIS — F10129 Alcohol abuse with intoxication, unspecified: Secondary | ICD-10-CM | POA: Insufficient documentation

## 2023-01-06 DIAGNOSIS — S20212A Contusion of left front wall of thorax, initial encounter: Secondary | ICD-10-CM | POA: Insufficient documentation

## 2023-01-06 DIAGNOSIS — Z85828 Personal history of other malignant neoplasm of skin: Secondary | ICD-10-CM | POA: Diagnosis not present

## 2023-01-06 DIAGNOSIS — Z5900 Homelessness unspecified: Secondary | ICD-10-CM | POA: Insufficient documentation

## 2023-01-06 DIAGNOSIS — S0990XA Unspecified injury of head, initial encounter: Secondary | ICD-10-CM | POA: Insufficient documentation

## 2023-01-06 DIAGNOSIS — Z7901 Long term (current) use of anticoagulants: Secondary | ICD-10-CM | POA: Diagnosis not present

## 2023-01-06 DIAGNOSIS — F10920 Alcohol use, unspecified with intoxication, uncomplicated: Secondary | ICD-10-CM

## 2023-01-06 DIAGNOSIS — Y908 Blood alcohol level of 240 mg/100 ml or more: Secondary | ICD-10-CM | POA: Insufficient documentation

## 2023-01-06 DIAGNOSIS — W19XXXA Unspecified fall, initial encounter: Secondary | ICD-10-CM

## 2023-01-06 DIAGNOSIS — R079 Chest pain, unspecified: Secondary | ICD-10-CM | POA: Diagnosis present

## 2023-01-06 DIAGNOSIS — R591 Generalized enlarged lymph nodes: Secondary | ICD-10-CM

## 2023-01-06 DIAGNOSIS — I10 Essential (primary) hypertension: Secondary | ICD-10-CM | POA: Insufficient documentation

## 2023-01-06 LAB — CBC WITH DIFFERENTIAL/PLATELET
Abs Immature Granulocytes: 0.01 10*3/uL (ref 0.00–0.07)
Basophils Absolute: 0.1 10*3/uL (ref 0.0–0.1)
Basophils Relative: 2 %
Eosinophils Absolute: 0 10*3/uL (ref 0.0–0.5)
Eosinophils Relative: 0 %
HCT: 25.4 % — ABNORMAL LOW (ref 39.0–52.0)
Hemoglobin: 8.3 g/dL — ABNORMAL LOW (ref 13.0–17.0)
Immature Granulocytes: 0 %
Lymphocytes Relative: 36 %
Lymphs Abs: 1.7 10*3/uL (ref 0.7–4.0)
MCH: 32.7 pg (ref 26.0–34.0)
MCHC: 32.7 g/dL (ref 30.0–36.0)
MCV: 100 fL (ref 80.0–100.0)
Monocytes Absolute: 0.3 10*3/uL (ref 0.1–1.0)
Monocytes Relative: 7 %
Neutro Abs: 2.7 10*3/uL (ref 1.7–7.7)
Neutrophils Relative %: 55 %
Platelets: 106 10*3/uL — ABNORMAL LOW (ref 150–400)
RBC: 2.54 MIL/uL — ABNORMAL LOW (ref 4.22–5.81)
RDW: 18.3 % — ABNORMAL HIGH (ref 11.5–15.5)
WBC: 4.8 10*3/uL (ref 4.0–10.5)
nRBC: 0 % (ref 0.0–0.2)

## 2023-01-06 LAB — COMPREHENSIVE METABOLIC PANEL
ALT: 29 U/L (ref 0–44)
AST: 71 U/L — ABNORMAL HIGH (ref 15–41)
Albumin: 3.2 g/dL — ABNORMAL LOW (ref 3.5–5.0)
Alkaline Phosphatase: 87 U/L (ref 38–126)
Anion gap: 15 (ref 5–15)
BUN: 9 mg/dL (ref 8–23)
CO2: 15 mmol/L — ABNORMAL LOW (ref 22–32)
Calcium: 7.2 mg/dL — ABNORMAL LOW (ref 8.9–10.3)
Chloride: 102 mmol/L (ref 98–111)
Creatinine, Ser: 0.7 mg/dL (ref 0.61–1.24)
GFR, Estimated: >60 mL/min (ref >60)
Glucose, Bld: 91 mg/dL (ref 70–99)
Potassium: 3.9 mmol/L (ref 3.5–5.1)
Sodium: 132 mmol/L — ABNORMAL LOW (ref 135–145)
Total Bilirubin: 0.8 mg/dL (ref 0.3–1.2)
Total Protein: 6.7 g/dL (ref 6.5–8.1)

## 2023-01-06 LAB — I-STAT VENOUS BLOOD GAS, ED
Acid-base deficit: 8 mmol/L — ABNORMAL HIGH (ref 0.0–2.0)
Acid-base deficit: 8 mmol/L — ABNORMAL HIGH (ref 0.0–2.0)
Bicarbonate: 13.9 mmol/L — ABNORMAL LOW (ref 20.0–28.0)
Bicarbonate: 16.3 mmol/L — ABNORMAL LOW (ref 20.0–28.0)
Calcium, Ion: 0.81 mmol/L — CL (ref 1.15–1.40)
Calcium, Ion: 0.91 mmol/L — ABNORMAL LOW (ref 1.15–1.40)
HCT: 28 % — ABNORMAL LOW (ref 39.0–52.0)
HCT: 28 % — ABNORMAL LOW (ref 39.0–52.0)
Hemoglobin: 9.5 g/dL — ABNORMAL LOW (ref 13.0–17.0)
Hemoglobin: 9.5 g/dL — ABNORMAL LOW (ref 13.0–17.0)
O2 Saturation: 80 %
O2 Saturation: 97 %
Patient temperature: 13
Potassium: 3.7 mmol/L (ref 3.5–5.1)
Potassium: 3.7 mmol/L (ref 3.5–5.1)
Sodium: 133 mmol/L — ABNORMAL LOW (ref 135–145)
Sodium: 133 mmol/L — ABNORMAL LOW (ref 135–145)
TCO2: 14 mmol/L — ABNORMAL LOW (ref 22–32)
TCO2: 17 mmol/L — ABNORMAL LOW (ref 22–32)
pCO2, Ven: 28.9 mmHg — ABNORMAL LOW (ref 44–60)
pCO2, Ven: <15 mmHg — CL (ref 44–60)
pH, Ven: 7.36 (ref 7.25–7.43)
pH, Ven: 7.845 (ref 7.25–7.43)
pO2, Ven: 88 mmHg — ABNORMAL HIGH (ref 32–45)
pO2, Ven: <15 mmHg — CL (ref 32–45)

## 2023-01-06 LAB — I-STAT CHEM 8, ED
BUN: 11 mg/dL (ref 8–23)
Calcium, Ion: 0.85 mmol/L — CL (ref 1.15–1.40)
Chloride: 104 mmol/L (ref 98–111)
Creatinine, Ser: 0.9 mg/dL (ref 0.61–1.24)
Glucose, Bld: 89 mg/dL (ref 70–99)
HCT: 31 % — ABNORMAL LOW (ref 39.0–52.0)
Hemoglobin: 10.5 g/dL — ABNORMAL LOW (ref 13.0–17.0)
Potassium: 4.1 mmol/L (ref 3.5–5.1)
Sodium: 133 mmol/L — ABNORMAL LOW (ref 135–145)
TCO2: 15 mmol/L — ABNORMAL LOW (ref 22–32)

## 2023-01-06 LAB — BETA-HYDROXYBUTYRIC ACID: Beta-Hydroxybutyric Acid: 0.25 mmol/L (ref 0.05–0.27)

## 2023-01-06 LAB — TROPONIN I (HIGH SENSITIVITY)
Troponin I (High Sensitivity): 21 ng/L — ABNORMAL HIGH (ref <18)
Troponin I (High Sensitivity): 21 ng/L — ABNORMAL HIGH (ref <18)

## 2023-01-06 LAB — ETHANOL: Alcohol, Ethyl (B): 314 mg/dL (ref <10)

## 2023-01-06 MED ORDER — IOHEXOL 350 MG/ML SOLN
75.0000 mL | Freq: Once | INTRAVENOUS | Status: AC | PRN
  Administered 2023-01-06: 75 mL via INTRAVENOUS

## 2023-01-06 MED ORDER — LACTATED RINGERS IV BOLUS
1000.0000 mL | Freq: Once | INTRAVENOUS | Status: AC
  Administered 2023-01-06: 1000 mL via INTRAVENOUS

## 2023-01-06 MED ORDER — AMIODARONE HCL 200 MG PO TABS
200.0000 mg | ORAL_TABLET | Freq: Every day | ORAL | Status: DC
  Administered 2023-01-06 (×2): 200 mg via ORAL
  Filled 2023-01-06 (×2): qty 1

## 2023-01-06 MED ORDER — METOPROLOL SUCCINATE ER 25 MG PO TB24
25.0000 mg | ORAL_TABLET | Freq: Two times a day (BID) | ORAL | Status: DC
  Administered 2023-01-06 (×2): 25 mg via ORAL
  Filled 2023-01-06 (×2): qty 1

## 2023-01-06 MED ORDER — DILTIAZEM HCL ER COATED BEADS 120 MG PO CP24
120.0000 mg | ORAL_CAPSULE | Freq: Every day | ORAL | Status: DC
  Administered 2023-01-06 (×2): 120 mg via ORAL
  Filled 2023-01-06 (×2): qty 1

## 2023-01-06 MED ORDER — MIDODRINE HCL 5 MG PO TABS
10.0000 mg | ORAL_TABLET | Freq: Three times a day (TID) | ORAL | Status: DC
  Administered 2023-01-06: 10 mg via ORAL
  Filled 2023-01-06: qty 2

## 2023-01-06 NOTE — ED Triage Notes (Signed)
Patient bib PTAR from the side of the road after someone saw him laying in the grass. Patient reported to EMS that his shoulder hurts and he drank 2 16ounce beers tonight. Patient was discharged from the ED this morning with shoulder pain. Patient reported to EMS that he fell asleep and missed the bus this evening. EMS reports VSS

## 2023-01-06 NOTE — ED Provider Notes (Signed)
  Physical Exam  BP (!) 117/90   Pulse (!) 105   Temp 98 F (36.7 C) (Oral)   Resp (!) 25   Ht 5\' 9"  (1.753 m)   Wt 63.5 kg   SpO2 (!) 87%   BMI 20.67 kg/m   Physical Exam  Procedures  Procedures  ED Course / MDM     Received care from Dr. Audrie Lia. Please see his note for prior hx physical and care.    Imaging completed shows clavicle fracture.  Does show signs of hematoma, observed in ED with improvement of HR,  no increasing swelling over the left side of neck and do not suspect clincially significant bleed

## 2023-01-06 NOTE — ED Triage Notes (Signed)
Pt arrives to ED c/o fall from possible syncopal episode. Pt endorses ETOH. Pt denies head strike. Pt with brusing and tenderness chest and left shoulder. Pt denies thinners. Pt in a-fib per EMS. Pt w/ stroke hx

## 2023-01-06 NOTE — ED Provider Notes (Signed)
[facility] EMERGENCY DEPARTMENT AT [facility] Provider Note  CSN: [id number] Arrival date & time: 01/06/23 7829  Chief Complaint(s) Fall  HPI Anthony [name] is a 67 y.o. male with PMH homelessness, chronic alcohol abuse, anxiety, depression, poorly controlled A-fib, orthostatic hypotension who presents emergency department for evaluation of a syncopal episode today.  Patient states that he has not been taking any of his medicines including his blood thinner and his amiodarone.  He states he was crossing the street and had a syncopal episode falling to the ground.  He endorses chest pain and left shoulder pain and arrives with bruising over the chest.  Unsure of head trauma.  Patient arrives with fecal matter over his lower extremities likely secondary to his recent syncopal episode.  He currently denies abdominal pain, nausea, vomiting, headache, fever or other systemic symptoms.   Past Medical History Past Medical History:  Diagnosis Date   Actinic keratosis 11/27/2016   Alcohol abuse with alcohol-induced mood disorder (HCC) 07/18/2018   Alcohol use disorder, severe, dependence (HCC) 09/16/2006   05/22/2015 Argumentative, belligerent and verbally abusive to staff per his note from [place]    Alcoholism [facility])    Anxiety state 04/25/2018   Aortic atherosclerosis (HCC) 08/27/2022   Chronic low back pain 09/16/2006   Followed by NS.  Per his chart from [place], he fell off two storyhouse roof while cleaning his brother's gutters and sustained compression fracture of L3, L4 and L5 and fracture of right transverse process at L3 and nondisplaced fracture of left glenoid rim many years ago. He had multiple imaging in [place] including Lumbar MRI in 2016 which showed moderate to severe spinal canal stenos   Delirium tremens (HCC) 09/01/2017   Depression    Dry eye 11/23/2016   Foreign body in middle portion of esophagus 05/19/2022   Hiatal hernia 09/14/2016   Status  Collis gastroplasty and Nissen's fundoplication on 04/29/2015 in [place]   History of delirium tremons 07/22/2020   DTs during 10/2016 08/2017. And 12/2017 admissions    History of pulmonary embolus (PE) 11/02/2016   Unprovoked 11/01/2016. Xarelto from 11/01/16 to 09/08/2017.   Hydrocele    Surgically corrected   Hypertension    Hypoalbuminemia due to protein-calorie malnutrition (HCC) 07/22/2020   Lumbar compression fracture (HCC)    Multiple rib fractures 07/22/2019   Left rib details XR: left ribs demonstrate multiple remote rib   Pancytopenia (HCC)    Rhabdomyolysis 07/22/2020   Skin cancer    exciced 2017   Tinea versicolor 06/08/2013   Tobacco use disorder 07/22/2020   Tooth infection 04/25/2018   Trigger finger, acquired 11/18/2012   Ulnar tunnel syndrome of right wrist 11/08/2012   Patient Active Problem List   Diagnosis Date Noted   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 difficile colitis 09/11/2022   Aspiration pneumonia (HCC) 08/27/2022   Aortic atherosclerosis (HCC) 08/27/2022   Depression, unspecified 08/09/2022   Hyponatremia 06/01/2022   Non compliance w medication regimen 05/31/2022   Delirium 05/25/2022   Stricture and stenosis of esophagus 05/21/2022   Gastritis and gastroduodenitis 05/21/2022   Loose stools 05/20/2022   Malnutrition of moderate degree 05/20/2022   Acute on chronic congestive heart failure (HCC)    Leukocytosis 05/19/2022  Sepsis (HCC) 05/19/2022   Cellulitis of left leg, possible 05/19/2022    Class: Question of   Moniliasis, interdigital 05/19/2022   Open toe wound, right foot, medial fourth digit 05/19/2022   Malnutrition (HCC) 05/19/2022    Alcohol abuse 03/24/2022   Dyslipidemia 03/24/2022   Acute on chronic diastolic CHF (congestive heart failure) (HCC) 03/24/2022   Acute respiratory failure with hypoxia (HCC) 03/23/2022   Generalized weakness    Atrial fibrillation with RVR (HCC) 03/15/2022   Hypokalemia 03/15/2022   Decreased functional mobility and endurance 06/22/2021   Iron deficiency anemia 10/16/2020   Cough    Hypomagnesemia    Protein-calorie malnutrition, severe 08/22/2020   Alcohol use disorder, severe, in controlled environment (HCC)    Pressure injury of skin 08/18/2020   Urinary tract infection without hematuria    Severe sepsis (HCC) 08/14/2020   Paroxysmal atrial fibrillation (HCC) 08/13/2020   Rhabdomyolysis 07/22/2020   Cerebral ventriculomegaly 07/22/2020   Hemoglobin decreased 07/22/2020   History of delirium tremons 07/22/2020   Hypoalbuminemia due to protein-calorie malnutrition (HCC) 07/22/2020   Tobacco use disorder 07/22/2020   Alcohol withdrawal delirium (HCC)    Dehydration    Alcohol abuse with alcohol-induced mood disorder (HCC) 07/18/2018   Depression 05/26/2018   Anxiety state 04/25/2018   Need for immunization against influenza 04/25/2018   Alcohol withdrawal (HCC) 09/01/2017   DOE (dyspnea on exertion) 11/27/2016   Actinic keratosis 11/27/2016   Macrocytic anemia 11/23/2016   History of pulmonary embolus (PE) 11/02/2016   Hiatal hernia 09/14/2016   Skin cancer 08/13/2016   Poor social situation 06/09/2013   Tinea versicolor 06/08/2013   Back pain 05/07/2013   Seborrheic dermatitis of scalp 11/08/2012   HTN (hypertension) 04/11/2012   Alcohol use disorder, severe, dependence (HCC) 09/16/2006   Home Medication(s) Prior to Admission medications   Medication Sig Start Date End Date Taking? Authorizing Provider  acetaminophen (TYLENOL) 500 MG tablet Take 1 tablet (500 mg total) by mouth every 8 (eight) hours as needed. Patient taking differently: Take 1,000 mg by mouth  every 6 (six) hours as needed (for pain). 10/13/22   Leatha Gilding, MD  amiodarone (PACERONE) 200 MG tablet Take 1 tablet (200 mg total) by mouth daily. 12/27/22   Regalado, Belkys A, MD  amiodarone (PACERONE) 200 MG tablet Take 2 tablets (400 mg total) by mouth 2 (two) times daily for 10 days. 12/18/22 12/28/22  Regalado, Belkys A, MD  BAYER BACK & BODY PAIN EX ST 500-32.5 MG TABS Take 1-2 tablets by mouth every 6 (six) hours as needed (for pain).    [provider]  diltiazem (CARDIZEM CD) 120 MG 24 hr capsule Take 1 capsule (120 mg total) by mouth daily. 12/19/22   Regalado, Jon Billings A, MD  ferrous sulfate 325 (65 FE) MG tablet Take 1 tablet (325 mg total) by mouth daily with breakfast. 12/18/22 01/17/23  Regalado, Jon Billings A, MD  folic acid (FOLVITE) 1 MG tablet Take 1 tablet (1 mg total) by mouth daily. 12/18/22   Regalado, Belkys A, MD  loratadine (CLARITIN) 10 MG tablet Take 1 tablet (10 mg total) by mouth daily. 12/19/22   Regalado, Belkys A, MD  magnesium oxide (MAG-OX) 400 (240 Mg) MG tablet Take 1 tablet (400 mg total) by mouth 2 (two) times daily. 12/18/22   Regalado, Belkys A, MD  methocarbamol (ROBAXIN) 500 MG tablet Take 1 tablet (500 mg total) by mouth every 6 (six) hours as needed for muscle spasms. 12/18/22   Regalado, [employer]  A, MD  metoprolol succinate (TOPROL-XL) 25 MG 24 hr tablet Take 1 tablet (25 mg total) by mouth 2 (two) times daily. 12/18/22 03/18/23  Regalado, Belkys A, MD  midodrine (PROAMATINE) 10 MG tablet Take 1 tablet (10 mg total) by mouth 3 (three) times daily with meals. 12/18/22   Regalado, Belkys A, MD  mirtazapine (REMERON) 15 MG tablet Take 1 tablet (15 mg total) by mouth at bedtime. 12/18/22   Regalado, Belkys A, MD  mometasone-formoterol (DULERA) 200-5 MCG/ACT AERO Inhale 2 puffs into the lungs 2 (two) times daily. 12/18/22   Regalado, Belkys A, MD  Multiple Vitamins-Minerals (CENTRUM SILVER 50+MEN) TABS Take 1 tablet by mouth every other day. 12/18/22   Regalado, Belkys  A, MD  Nutritional Supplements (ENSURE HIGH PROTEIN) LIQD Take 237 mLs by mouth daily.    [provider]  rivaroxaban (XARELTO) 20 MG TABS tablet Take 1 tablet (20 mg total) by mouth daily with supper. 12/18/22   Regalado, Belkys A, MD  rosuvastatin (CRESTOR) 10 MG tablet Take 1 tablet (10 mg total) by mouth daily. 12/18/22   Regalado, Belkys A, MD  TYLENOL PM EXTRA STRENGTH 500-25 MG TABS tablet Take 2 tablets by mouth at bedtime as needed (for sleep).    [provider]                                                                                                                                    Past Surgical History Past Surgical History:  Procedure Laterality Date   BIOPSY  05/21/2022   Procedure: BIOPSY;  Surgeon: Lynann Bologna, MD;  Location: [facility] ENDOSCOPY;  Service: Gastroenterology;;   CATARACT EXTRACTION     ESOPHAGOGASTRODUODENOSCOPY (EGD) WITH PROPOFOL N/A 05/21/2022   Procedure: ESOPHAGOGASTRODUODENOSCOPY (EGD) WITH PROPOFOL;  Surgeon: Lynann Bologna, MD;  Location: [facility] ENDOSCOPY;  Service: Gastroenterology;  Laterality: N/A;   FOREIGN BODY REMOVAL N/A 05/21/2022   Procedure: FOREIGN BODY REMOVAL;  Surgeon: Lynann Bologna, MD;  Location: [facility] ENDOSCOPY;  Service: Gastroenterology;  Laterality: N/A;   HIATAL HERNIA REPAIR  2016   HYDROCELE EXCISION / REPAIR  2010   SKIN CANCER EXCISION Left    Excised 2017   Family History Family History  Problem Relation Age of Onset   Heart attack Father        died at 38 years   Dementia Father    Stroke Father    Cancer Sister    Colon cancer Neg Hx    Colon polyps Neg Hx    Esophageal cancer Neg Hx    Stomach cancer Neg Hx    Rectal cancer Neg Hx     Social History Social History   Tobacco Use   Smoking status: Never   Smokeless tobacco: Current    Types: Snuff   Tobacco comments:    Pt states do snuff when stressed had 1 can last 3 month//PL  Vaping Use   Vaping Use:  Never used  Substance Use Topics    Alcohol use: Yes    Alcohol/week: 43.0 standard drinks of alcohol    Types: 42 Cans of beer, 1 Standard drinks or equivalent per week   Drug use: Yes    Types: Marijuana   Allergies Other and Poison ivy extract  Review of Systems Review of Systems  Cardiovascular:  Positive for chest pain.  Neurological:  Positive for syncope.    Physical Exam Vital Signs  I have reviewed the triage vital signs BP 138/88 (BP Location: Right Arm)   Pulse (!) 128   Temp 97.9 F (36.6 C)   Resp 18   Ht 5\' 9"  (1.753 m)   Wt 63.5 kg   SpO2 99%   BMI 20.67 kg/m   Physical Exam Constitutional:      General: He is not in acute distress.    Appearance: Normal appearance.  HENT:     Head: Normocephalic and atraumatic.     Nose: No congestion or rhinorrhea.  Eyes:     General:        Right eye: No discharge.        Left eye: No discharge.     Extraocular Movements: Extraocular movements intact.     Pupils: Pupils are equal, round, and reactive to light.  Cardiovascular:     Rate and Rhythm: Tachycardia present. Rhythm irregular.     Heart sounds: No murmur heard. Pulmonary:     Effort: No respiratory distress.     Breath sounds: No wheezing or rales.  Abdominal:     General: There is no distension.     Tenderness: There is no abdominal tenderness.  Musculoskeletal:        General: Tenderness present. Normal range of motion.     Cervical back: Normal range of motion.  Skin:    General: Skin is warm and dry.     Findings: Bruising present.  Neurological:     General: No focal deficit present.     Mental Status: He is alert.     ED Results and Treatments Labs (all labs ordered are listed, but only abnormal results are displayed) Labs Reviewed  I-STAT CHEM 8, ED - Abnormal; Notable for the following components:      Result Value   Sodium 133 (*)    Calcium, Ion 0.85 (*)    TCO2 15 (*)    Hemoglobin 10.5 (*)    HCT 31.0 (*)    All other components within normal limits   COMPREHENSIVE METABOLIC PANEL  CBC WITH DIFFERENTIAL/PLATELET  TROPONIN I (HIGH SENSITIVITY)                                                                                                                          Radiology No results found.  Pertinent labs & imaging results that were available during my care of the patient were reviewed by me and considered in my medical decision making (see MDM for details).  Medications Ordered in ED Medications  amiodarone (PACERONE) tablet 200 mg (has no administration in time range)  diltiazem (CARDIZEM CD) 24 hr capsule 120 mg (has no administration in time range)  metoprolol succinate (TOPROL-XL) 24 hr tablet 25 mg (has no administration in time range)  midodrine (PROAMATINE) tablet 10 mg (has no administration in time range)  lactated ringers bolus 1,000 mL (has no administration in time range)                                                                                                                                     Procedures .Critical Care  Performed by: Glendora Score, MD Authorized by: Glendora Score, MD   Critical care provider statement:    Critical care time (minutes):  30   Critical care was necessary to treat or prevent imminent or life-threatening deterioration of the following conditions:  Cardiac failure   Critical care was time spent personally by me on the following activities:  Development of treatment plan with patient or surrogate, discussions with consultants, evaluation of patient's response to treatment, examination of patient, ordering and review of laboratory studies, ordering and review of radiographic studies, ordering and performing treatments and interventions, pulse oximetry, re-evaluation of patient's condition and review of old charts   (including critical care time)  Medical Decision Making / ED Course   This patient presents to the ED for concern of syncope, shoulder pain, this involves an  extensive number of treatment options, and is a complaint that carries with it a high risk of complications and morbidity.  The differential diagnosis includes orthostatic syncope, cardiogenic syncope, alcohol intoxication, polysubstance use, fracture, pneumothorax, electrolyte abnormality, dehydration  MDM: Patient seen emergency room for evaluation of a syncopal episode and left shoulder pain.  Physical exam with bruising over the left chest wall and tenderness over the left chest and shoulder, rapid irregular tachycardia.  Laboratory evaluation with a hemoglobin of 8.3, thrombocytopenia to 106, hyponatremia 132 initial hypocalcemia on initial i-STAT at 0.85 but follow-up i-STAT VBG showing an improved calcium to 0.91.  Beta-hydroxybutyrate is negative and lower suspicion for alcoholic ketosis.  Patient's alcohol is 314 and likely had contributed to his syncopal episode today.  Trauma imaging revealing a clavicle fracture and patient placed in a sling.  Home medications initiated and rapid tachycardia improved.  At time of signout, patient pending metabolization of underlying substances.  Please see provider signout for continuation of workup.  Anticipate discharge   Additional history obtained:  -External records from outside source obtained and reviewed including: Chart review including previous notes, labs, imaging, consultation notes   Lab Tests: -I ordered, reviewed, and interpreted labs.   The pertinent results include:   Labs Reviewed  I-STAT CHEM 8, ED - Abnormal; Notable for the following components:      Result Value   Sodium 133 (*)    Calcium, Ion 0.85 (*)    TCO2  15 (*)    Hemoglobin 10.5 (*)    HCT 31.0 (*)    All other components within normal limits  COMPREHENSIVE METABOLIC PANEL  CBC WITH DIFFERENTIAL/PLATELET  TROPONIN I (HIGH SENSITIVITY)      EKG   EKG Interpretation  Date/Time:  Wednesday January 06 2023 03:55:33 EDT Ventricular Rate:  123 PR Interval:    QRS  Duration: 98 QT Interval:  363 QTC Calculation: 520 R Axis:   89 Text Interpretation: Atrial fibrillation Borderline right axis deviation Probable anteroseptal infarct, old Prolonged QT interval Since prior ECG, rhythm now appears to be atrial fibrillation Confirmed by Alvira Monday ([id number]) on 01/06/2023 7:59:11 AM         Imaging Studies ordered: I ordered imaging studies including clavicle x-ray, CT head, C-spine, chest I independently visualized and interpreted imaging. I agree with the radiologist interpretation   Medicines ordered and prescription drug management: Meds ordered this encounter  Medications   amiodarone (PACERONE) tablet 200 mg   diltiazem (CARDIZEM CD) 24 hr capsule 120 mg   metoprolol succinate (TOPROL-XL) 24 hr tablet 25 mg   midodrine (PROAMATINE) tablet 10 mg   lactated ringers bolus 1,000 mL    -I have reviewed the patients home medicines and have made adjustments as needed  Critical interventions Diltiazem, amiodarone, metoprolol, fluids    Cardiac Monitoring: The patient was maintained on a cardiac monitor.  I personally viewed and interpreted the cardiac monitored which showed an underlying rhythm of: A-fib with RVR  Social Determinants of Health:  Factors impacting patients care include: Daily alcohol use, homeless   Reevaluation: After the interventions noted above, I reevaluated the patient and found that they have :improved  Co morbidities that complicate the patient evaluation  Past Medical History:  Diagnosis Date   Actinic keratosis 11/27/2016   Alcohol abuse with alcohol-induced mood disorder (HCC) 07/18/2018   Alcohol use disorder, severe, dependence (HCC) 09/16/2006   05/22/2015 Argumentative, belligerent and verbally abusive to staff per his note from [place]    Alcoholism [facility])    Anxiety state 04/25/2018   Aortic atherosclerosis (HCC) 08/27/2022   Chronic low back pain 09/16/2006   Followed by NS.  Per his chart  from [place], he fell off two storyhouse roof while cleaning his brother's gutters and sustained compression fracture of L3, L4 and L5 and fracture of right transverse process at L3 and nondisplaced fracture of left glenoid rim many years ago. He had multiple imaging in [place] including Lumbar MRI in 2016 which showed moderate to severe spinal canal stenos   Delirium tremens (HCC) 09/01/2017   Depression    Dry eye 11/23/2016   Foreign body in middle portion of esophagus 05/19/2022   Hiatal hernia 09/14/2016   Status Collis gastroplasty and Nissen's fundoplication on 04/29/2015 in [place]   History of delirium tremons 07/22/2020   DTs during 10/2016 08/2017. And 12/2017 admissions    History of pulmonary embolus (PE) 11/02/2016   Unprovoked 11/01/2016. Xarelto from 11/01/16 to 09/08/2017.   Hydrocele    Surgically corrected   Hypertension    Hypoalbuminemia due to protein-calorie malnutrition (HCC) 07/22/2020   Lumbar compression fracture (HCC)    Multiple rib fractures 07/22/2019   Left rib details XR: left ribs demonstrate multiple remote rib   Pancytopenia (HCC)    Rhabdomyolysis 07/22/2020   Skin cancer    exciced 2017   Tinea versicolor 06/08/2013   Tobacco use disorder 07/22/2020   Tooth infection 04/25/2018   Trigger finger, acquired 11/18/2012  Ulnar tunnel syndrome of right wrist 11/08/2012      Dispostion: I considered admission for this patient, and at time of signout patient pending metabolization of underlying substances and reevaluation by oncoming provider.  Please see provider signout for continuation of workup.     Final Clinical Impression(s) / ED Diagnoses Final diagnoses:  None     @PCDICTATION @    Glendora Score, MD 01/06/23 [phone]

## 2023-01-06 NOTE — ED Notes (Signed)
Pt eating and drinking

## 2023-01-06 NOTE — Progress Notes (Signed)
Orthopedic Tech Progress Note Patient Details:  Anthony [name] [birth year] [id number]  Ortho Devices Type of Ortho Device: Shoulder immobilizer Ortho Device/Splint Location: LUE Ortho Device/Splint Interventions: Ordered, Application, Adjustment   Post Interventions Patient Tolerated: Well  Al Decant 01/06/2023, 6:52 AM

## 2023-01-07 ENCOUNTER — Encounter (HOSPITAL_COMMUNITY): Payer: Self-pay

## 2023-01-07 MED ORDER — ACETAMINOPHEN 325 MG PO TABS
650.0000 mg | ORAL_TABLET | Freq: Once | ORAL | Status: AC
  Administered 2023-01-07: 650 mg via ORAL
  Filled 2023-01-07: qty 2

## 2023-01-07 NOTE — ED Provider Notes (Signed)
[place] EMERGENCY DEPARTMENT AT [facility] Provider Note   CSN: [id number] Arrival date & time: 01/06/23  2211     History  Chief Complaint  Patient presents with   Alcohol Intoxication   Shoulder Pain    Anthony [name] is a 67 y.o. male patient with history of homelessness who presents via EMS this evening after being found lying in the grass.  Per chart review patient was evaluated in the department this morning normal pulses in left arm After a fall and was found to have a left clavicular fracture, placed in a sling and discharged without other concerning injury.  Did also have some evidence of dehydration at the time but was improved following fluid bolus.  Patient with history of extensive alcohol use and atrial fibrillation poorly compliant with his medications at this time he states that he is tired and that he "has been walking all day and my shoulder hurts".  He states that he does not have any other concerns and "that I will not be walking anymore tonight and will be lying in his bed and sleeping".  Patient does not have any acute concerns aside from his soreness following his fall secondary to clavicular fracture.  I personally reviewed medical records previous history of hypertension A-fib, homelessness, PE, alcoholism.  He is supposed to be anticoagulated on Xarelto but is poorly compliant due to substance use and social status. HPI     Home Medications Prior to Admission medications   Medication Sig Start Date End Date Taking? Authorizing Provider  acetaminophen (TYLENOL) 500 MG tablet Take 1 tablet (500 mg total) by mouth every 8 (eight) hours as needed. Patient taking differently: Take 1,000 mg by mouth every 6 (six) hours as needed (for pain). 10/13/22   Leatha Gilding, MD  amiodarone (PACERONE) 200 MG tablet Take 1 tablet (200 mg total) by mouth daily. 12/27/22   Regalado, Belkys A, MD  amiodarone (PACERONE) 200 MG tablet Take 2 tablets (400 mg total) by  mouth 2 (two) times daily for 10 days. 12/18/22 12/28/22  Regalado, Belkys A, MD  BAYER BACK & BODY PAIN EX ST 500-32.5 MG TABS Take 1-2 tablets by mouth every 6 (six) hours as needed (for pain).    [provider]  diltiazem (CARDIZEM CD) 120 MG 24 hr capsule Take 1 capsule (120 mg total) by mouth daily. 12/19/22   Regalado, Jon Billings A, MD  ferrous sulfate 325 (65 FE) MG tablet Take 1 tablet (325 mg total) by mouth daily with breakfast. 12/18/22 01/17/23  Regalado, Jon Billings A, MD  folic acid (FOLVITE) 1 MG tablet Take 1 tablet (1 mg total) by mouth daily. 12/18/22   Regalado, Belkys A, MD  loratadine (CLARITIN) 10 MG tablet Take 1 tablet (10 mg total) by mouth daily. 12/19/22   Regalado, Belkys A, MD  magnesium oxide (MAG-OX) 400 (240 Mg) MG tablet Take 1 tablet (400 mg total) by mouth 2 (two) times daily. 12/18/22   Regalado, Belkys A, MD  methocarbamol (ROBAXIN) 500 MG tablet Take 1 tablet (500 mg total) by mouth every 6 (six) hours as needed for muscle spasms. 12/18/22   Regalado, Belkys A, MD  metoprolol succinate (TOPROL-XL) 25 MG 24 hr tablet Take 1 tablet (25 mg total) by mouth 2 (two) times daily. 12/18/22 03/18/23  Regalado, Belkys A, MD  midodrine (PROAMATINE) 10 MG tablet Take 1 tablet (10 mg total) by mouth 3 (three) times daily with meals. 12/18/22   Regalado, Prentiss Bells, MD  mirtazapine (REMERON) 15 MG tablet Take 1 tablet (15 mg total) by mouth at bedtime. 12/18/22   Regalado, Belkys A, MD  mometasone-formoterol (DULERA) 200-5 MCG/ACT AERO Inhale 2 puffs into the lungs 2 (two) times daily. 12/18/22   Regalado, Belkys A, MD  Multiple Vitamins-Minerals (CENTRUM SILVER 50+MEN) TABS Take 1 tablet by mouth every other day. 12/18/22   Regalado, Belkys A, MD  Nutritional Supplements (ENSURE HIGH PROTEIN) LIQD Take 237 mLs by mouth daily.    [provider]  rivaroxaban (XARELTO) 20 MG TABS tablet Take 1 tablet (20 mg total) by mouth daily with supper. 12/18/22   Regalado, Belkys A, MD  rosuvastatin  (CRESTOR) 10 MG tablet Take 1 tablet (10 mg total) by mouth daily. 12/18/22   Regalado, Belkys A, MD  TYLENOL PM EXTRA STRENGTH 500-25 MG TABS tablet Take 2 tablets by mouth at bedtime as needed (for sleep).    [provider]      Allergies    Other and Poison ivy extract    Review of Systems   Review of Systems  Constitutional: Negative.   HENT: Negative.    Respiratory: Negative.    Cardiovascular: Negative.   Gastrointestinal: Negative.   Musculoskeletal:        Left shoulder pain at known fracture site    Physical Exam Updated Vital Signs BP 115/72   Pulse 72   Temp 98 F (36.7 C)   Resp 16   SpO2 98%  Physical Exam Vitals and nursing note reviewed.  Constitutional:      Appearance: He is not ill-appearing or toxic-appearing.     Comments: Poor personal hygiene  HENT:     Head: Normocephalic and atraumatic.     Nose: Nose normal.     Mouth/Throat:     Mouth: Mucous membranes are moist.     Pharynx: No oropharyngeal exudate or posterior oropharyngeal erythema.  Eyes:     General:        Right eye: No discharge.        Left eye: No discharge.     Extraocular Movements: Extraocular movements intact.     Conjunctiva/sclera: Conjunctivae normal.     Pupils: Pupils are equal, round, and reactive to light.  Cardiovascular:     Rate and Rhythm: Normal rate and regular rhythm.     Pulses: Normal pulses.  Pulmonary:     Effort: Pulmonary effort is normal. No respiratory distress.     Breath sounds: Normal breath sounds. No wheezing or rales.  Chest:     Chest wall: No mass, lacerations, deformity, swelling, tenderness, crepitus or edema. There is no dullness to percussion.  Abdominal:     General: Bowel sounds are normal. There is no distension.     Palpations: Abdomen is soft.     Tenderness: There is no abdominal tenderness. There is no right CVA tenderness, left CVA tenderness, guarding or rebound.  Musculoskeletal:       Arms:     Cervical back: Neck  supple.     Right lower leg: No edema.     Left lower leg: No edema.     Comments: Normal bilateral upper extremity pulses and capillary refill.  Normal sensation and function of the left hand.  Skin:    General: Skin is warm and dry.     Capillary Refill: Capillary refill takes less than 2 seconds.  Neurological:     General: No focal deficit present.     Mental Status: He is  alert and oriented to person, place, and time. Mental status is at baseline.     GCS: GCS eye subscore is 4. GCS verbal subscore is 5. GCS motor subscore is 6.     Sensory: Sensation is intact.     Motor: Motor function is intact.     Coordination: Coordination is intact.  Psychiatric:        Mood and Affect: Mood normal.     ED Results / Procedures / Treatments   Labs (all labs ordered are listed, but only abnormal results are displayed) Labs Reviewed - No data to display  EKG None  Radiology CT Chest W Contrast  Result Date: 01/06/2023 CLINICAL DATA:  Status post fall from possible syncopal episode. Bruising and tenderness to the chest and left shoulder. EXAM: CT CHEST WITH CONTRAST TECHNIQUE: Multidetector CT imaging of the chest was performed during intravenous contrast administration. RADIATION DOSE REDUCTION: This exam was performed according to the departmental dose-optimization program which includes automated exposure control, adjustment of the mA and/or kV according to patient size and/or use of iterative reconstruction technique. CONTRAST:  75mL OMNIPAQUE IOHEXOL 350 MG/ML SOLN COMPARISON:  10/06/2022 FINDINGS: Cardiovascular: The heart size is upper normal to borderline enlarged. No substantial pericardial effusion. Coronary artery calcification is evident. Insert normal thoracic Enlargement of the pulmonary outflow tract/main pulmonary arteries suggests pulmonary arterial hypertension. Prominent left external jugular vein courses near the medial clavicular fracture to the confluence with the  subclavian vein. There is some apparent irregularity in this region by CT imaging although assessment is hindered by distortion of tissue plane secondary to the fracture and some mild beam hardening artifact emanating from an EKG lead over the anterior left shoulder. No definite active extravasation. Mediastinum/Nodes: 13 mm short axis precarinal lymph node on 77/3 was 11 mm previously (remeasured). 14 mm subcarinal lymph node was 11 mm previously (remeasured). There is no hilar lymphadenopathy. The esophagus has normal imaging features. There is no axillary lymphadenopathy. Lungs/Pleura: Layering mucus or secretions noted right mainstem bronchus. Calcified granuloma noted in the posterior right lung apex. Dependent volume loss in both lower lobes compatible with atelectasis. There is some lower lung predominant mild circumferential bronchial wall thickening. No suspicious pulmonary nodule or mass. No pneumothorax or pleural effusion. Upper Abdomen: Postoperative changes noted at the esophagogastric junction. Visualized portion of the upper abdomen is otherwise unremarkable. Musculoskeletal: Soft tissue edema/hemorrhage noted in the anterior left lower neck and shoulder region. Comminuted acute fracture involving the head of the left clavicle evident with a second acute fracture in the distal left clavicle at the acromioclavicular joint. (See coronal images 27 and 48 of series 6, respectively). Acute to subacute fracture of the lateral right sixth rib visible on axial 88/3. Nonacute healed rib fractures noted bilaterally. Healed fracture noted in the manubrium with no acute sternal fracture on today's study. No evidence for thoracic spine fracture. Chronic superior endplate compression deformity noted at L2 and L3. IMPRESSION: 1. Acute comminuted fracture involving the head of the left clavicle with a second acute fracture in the distal left clavicle at the acromioclavicular joint. There is fairly extensive  edema/hemorrhage in the superficial soft tissues of the left lower neck and upper chest. The inferior aspect of the left external jugular vein shows a somewhat bulbous configuration in the neck, above the level of the clavicle fracture without evidence for active extravasation and may be within normal limits. The confluence of the left external jugular vein and subclavian vein is not well demonstrated due  to distortion of tissue planes and beam hardening artifact in this region. No definite contrast extravasation evident by CT imaging. If there is clinical concern for active or ongoing hemorrhage, repeat CT venogram of the neck may prove helpful. 2. Acute to subacute fracture of the lateral right sixth rib. 3. No pneumothorax or pleural effusion. 4. Mild mediastinal lymphadenopathy, slightly increased in the interval. This is nonspecific and may be reactive. Follow-up CT chest with contrast in 3 months recommended to ensure stability. 5. Enlargement of the pulmonary outflow tract/main pulmonary arteries suggests pulmonary arterial hypertension. 6. Mild circumferential bronchial wall thickening in the lower lungs bilaterally. Imaging features could be related to acute or chronic bronchitis. Electronically Signed   By: Kennith Center M.D.   On: 01/06/2023 05:59   DG Clavicle Left  Result Date: 01/06/2023 CLINICAL DATA:  Fall with bruising over the left clavicle EXAM: LEFT CLAVICLE - 2 VIEWS COMPARISON:  None Available. FINDINGS: Acute fracture of the medial left clavicle with impaction, poorly seen on this study but known from a contemporaneous chest CT. There is also fracturing at the lateral third of the clavicle near the acromioclavicular joint which is nondisplaced. Located [facility] joint. IMPRESSION: 1. Acute medial clavicle fracture with impaction better seen on chest CT. 2. Nondisplaced lateral third clavicle fracture. Electronically Signed   By: Tiburcio Pea M.D.   On: 01/06/2023 05:51   CT Head Wo  Contrast  Result Date: 01/06/2023 CLINICAL DATA:  Minor head trauma EXAM: CT HEAD WITHOUT CONTRAST CT CERVICAL SPINE WITHOUT CONTRAST TECHNIQUE: Multidetector CT imaging of the head and cervical spine was performed following the standard protocol without intravenous contrast. Multiplanar CT image reconstructions of the cervical spine were also generated. RADIATION DOSE REDUCTION: This exam was performed according to the departmental dose-optimization program which includes automated exposure control, adjustment of the mA and/or kV according to patient size and/or use of iterative reconstruction technique. COMPARISON:  Brain MRI 12/03/2022 FINDINGS: CT HEAD FINDINGS Brain: No evidence of acute infarction, hemorrhage, hydrocephalus, extra-axial collection or mass lesion/mass effect. Brain atrophy especially affecting the cerebellum. Vascular: No hyperdense vessel or unexpected calcification. Skull: Negative for fracture Sinuses/Orbits: No evidence of injury CT CERVICAL SPINE FINDINGS Alignment: No traumatic malalignment. There is degenerative C3-4 anterolisthesis, facet mediated Skull base and vertebrae: Medial left clavicle fracture with impaction. Negative for cervical spine fracture. Soft tissues and spinal canal: Soft tissue swelling in the left neck and upper shoulder associated with the clavicle fracture. Disc levels: Disc collapse with endplate and uncovertebral ridging at multiple levels. Degenerative facet spurring especially at C2-3 and C3-4 with C3-4 anterolisthesis and biforaminal stenosis Upper chest: Clear apical lungs IMPRESSION: 1. Medial left clavicle fracture. 2. No evidence of intracranial injury or cervical spine fracture. Electronically Signed   By: Tiburcio Pea M.D.   On: 01/06/2023 05:41   CT Cervical Spine Wo Contrast  Result Date: 01/06/2023 CLINICAL DATA:  Minor head trauma EXAM: CT HEAD WITHOUT CONTRAST CT CERVICAL SPINE WITHOUT CONTRAST TECHNIQUE: Multidetector CT imaging of the  head and cervical spine was performed following the standard protocol without intravenous contrast. Multiplanar CT image reconstructions of the cervical spine were also generated. RADIATION DOSE REDUCTION: This exam was performed according to the departmental dose-optimization program which includes automated exposure control, adjustment of the mA and/or kV according to patient size and/or use of iterative reconstruction technique. COMPARISON:  Brain MRI 12/03/2022 FINDINGS: CT HEAD FINDINGS Brain: No evidence of acute infarction, hemorrhage, hydrocephalus, extra-axial collection or mass lesion/mass effect. Brain  atrophy especially affecting the cerebellum. Vascular: No hyperdense vessel or unexpected calcification. Skull: Negative for fracture Sinuses/Orbits: No evidence of injury CT CERVICAL SPINE FINDINGS Alignment: No traumatic malalignment. There is degenerative C3-4 anterolisthesis, facet mediated Skull base and vertebrae: Medial left clavicle fracture with impaction. Negative for cervical spine fracture. Soft tissues and spinal canal: Soft tissue swelling in the left neck and upper shoulder associated with the clavicle fracture. Disc levels: Disc collapse with endplate and uncovertebral ridging at multiple levels. Degenerative facet spurring especially at C2-3 and C3-4 with C3-4 anterolisthesis and biforaminal stenosis Upper chest: Clear apical lungs IMPRESSION: 1. Medial left clavicle fracture. 2. No evidence of intracranial injury or cervical spine fracture. Electronically Signed   By: Tiburcio Pea M.D.   On: 01/06/2023 05:41    Procedures Procedures    Medications Ordered in ED Medications  acetaminophen (TYLENOL) tablet 650 mg (650 mg Oral Given 01/07/23 0058)    ED Course/ Medical Decision Making/ A&P                             Medical Decision Making 67 year old male presents with concern for left shoulder plain in context of known clavicular fracture, also requesting place to  sleep.  Borderline blood pressure on intake, improved on recheck.  Cardiopulmonary exam is unremarkable, abdominal exam is benign.  Patient with bruising over the left clavicle without evidence of expanding hematoma to the neck.  Normal phonation, no evidence of respiratory distress.  Patient's workup from this morning was reviewed by this provider.  Patient without acute concern, hemodynamically stable at this time.  Oral analgesia offered for patient's fracture related pain, however no other evidence of acute medical condition that would warrant further ED workup.  Patient offered something to eat and drink, declined.  Patient refusing to walk for staff in the emergency department stating that he has "been walking all day and will not walk anymore".  Patient displeased with disposition for discharge, however no indication of acute or emergent medical condition that would warrant further ED workup or patient management.  Suspect pain related to fracture of the left clavicle was identified earlier in the day, no evidence of concerning expanding hematoma.  Patient neurovascular tact in all extremities.  Risk OTC drugs.   Suspect malingering as primary reason for ED visit this evening. [place] voiced understanding of his medical evaluation and treatment plan. Each of their questions answered to their expressed satisfaction.  Return precautions were given.  Patient is well-appearing, stable, and was discharged in good condition.  This chart was dictated using voice recognition software, Dragon. Despite the best efforts of this provider to proofread and correct errors, errors may still occur which can change documentation meaning.  Final Clinical Impression(s) / ED Diagnoses Final diagnoses:  Acute pain of left shoulder  Closed nondisplaced fracture of left clavicle with routine healing, unspecified part of clavicle, subsequent encounter    Rx / DC Orders ED Discharge Orders     None          Paris Lore, PA-C 01/07/23 0141    Sabas Sous, MD 01/07/23 0700

## 2023-01-07 NOTE — Discharge Instructions (Addendum)
You are seen in the ER today for your shoulder pain.  This is consistent with your previously diagnosed clavicle fracture.  You may follow-up with the orthopedist Listed below.  Please call the clinic.  [facility] and wellness center to establish care with a primary care doctor.  They can also assist you in securing your prescriptions for your home medications.  Return to the ER with any new severe symptoms.
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
## 2023-02-03 ENCOUNTER — Emergency Department (HOSPITAL_COMMUNITY): Payer: 59

## 2023-02-03 ENCOUNTER — Inpatient Hospital Stay (HOSPITAL_COMMUNITY)
Admission: EM | Admit: 2023-02-03 | Discharge: 2023-02-16 | DRG: 175 | Disposition: A | Discharge status: Skilled Nursing Facility | Payer: 59 | Attending: Internal Medicine | Admitting: Internal Medicine

## 2023-02-03 DIAGNOSIS — Z8249 Family history of ischemic heart disease and other diseases of the circulatory system: Secondary | ICD-10-CM

## 2023-02-03 DIAGNOSIS — D638 Anemia in other chronic diseases classified elsewhere: Secondary | ICD-10-CM | POA: Diagnosis present

## 2023-02-03 DIAGNOSIS — I9589 Other hypotension: Secondary | ICD-10-CM | POA: Diagnosis present

## 2023-02-03 DIAGNOSIS — I2699 Other pulmonary embolism without acute cor pulmonale: Secondary | ICD-10-CM | POA: Diagnosis present

## 2023-02-03 DIAGNOSIS — Z7901 Long term (current) use of anticoagulants: Secondary | ICD-10-CM

## 2023-02-03 DIAGNOSIS — I5082 Biventricular heart failure: Secondary | ICD-10-CM | POA: Diagnosis present

## 2023-02-03 DIAGNOSIS — S27321A Contusion of lung, unilateral, initial encounter: Secondary | ICD-10-CM | POA: Diagnosis present

## 2023-02-03 DIAGNOSIS — E871 Hypo-osmolality and hyponatremia: Secondary | ICD-10-CM | POA: Diagnosis present

## 2023-02-03 DIAGNOSIS — I10 Essential (primary) hypertension: Secondary | ICD-10-CM | POA: Diagnosis present

## 2023-02-03 DIAGNOSIS — I4891 Unspecified atrial fibrillation: Secondary | ICD-10-CM | POA: Diagnosis not present

## 2023-02-03 DIAGNOSIS — I272 Pulmonary hypertension, unspecified: Secondary | ICD-10-CM | POA: Diagnosis present

## 2023-02-03 DIAGNOSIS — I2694 Multiple subsegmental pulmonary emboli without acute cor pulmonale: Secondary | ICD-10-CM

## 2023-02-03 DIAGNOSIS — E162 Hypoglycemia, unspecified: Secondary | ICD-10-CM | POA: Diagnosis present

## 2023-02-03 DIAGNOSIS — E785 Hyperlipidemia, unspecified: Secondary | ICD-10-CM | POA: Diagnosis present

## 2023-02-03 DIAGNOSIS — Z818 Family history of other mental and behavioral disorders: Secondary | ICD-10-CM

## 2023-02-03 DIAGNOSIS — S2232XA Fracture of one rib, left side, initial encounter for closed fracture: Secondary | ICD-10-CM | POA: Diagnosis present

## 2023-02-03 DIAGNOSIS — Z91148 Patient's other noncompliance with medication regimen for other reason: Secondary | ICD-10-CM

## 2023-02-03 DIAGNOSIS — Z1152 Encounter for screening for COVID-19: Secondary | ICD-10-CM

## 2023-02-03 DIAGNOSIS — E860 Dehydration: Secondary | ICD-10-CM | POA: Diagnosis present

## 2023-02-03 DIAGNOSIS — Z5941 Food insecurity: Secondary | ICD-10-CM

## 2023-02-03 DIAGNOSIS — W19XXXA Unspecified fall, initial encounter: Secondary | ICD-10-CM | POA: Diagnosis present

## 2023-02-03 DIAGNOSIS — E874 Mixed disorder of acid-base balance: Secondary | ICD-10-CM | POA: Diagnosis present

## 2023-02-03 DIAGNOSIS — I110 Hypertensive heart disease with heart failure: Secondary | ICD-10-CM | POA: Diagnosis present

## 2023-02-03 DIAGNOSIS — Z7951 Long term (current) use of inhaled steroids: Secondary | ICD-10-CM

## 2023-02-03 DIAGNOSIS — Z809 Family history of malignant neoplasm, unspecified: Secondary | ICD-10-CM

## 2023-02-03 DIAGNOSIS — I2510 Atherosclerotic heart disease of native coronary artery without angina pectoris: Secondary | ICD-10-CM | POA: Diagnosis present

## 2023-02-03 DIAGNOSIS — Z781 Physical restraint status: Secondary | ICD-10-CM

## 2023-02-03 DIAGNOSIS — I480 Paroxysmal atrial fibrillation: Secondary | ICD-10-CM | POA: Diagnosis present

## 2023-02-03 DIAGNOSIS — G9341 Metabolic encephalopathy: Secondary | ICD-10-CM | POA: Diagnosis not present

## 2023-02-03 DIAGNOSIS — R0602 Shortness of breath: Secondary | ICD-10-CM | POA: Diagnosis not present

## 2023-02-03 DIAGNOSIS — I252 Old myocardial infarction: Secondary | ICD-10-CM

## 2023-02-03 DIAGNOSIS — Z86711 Personal history of pulmonary embolism: Secondary | ICD-10-CM

## 2023-02-03 DIAGNOSIS — S42002D Fracture of unspecified part of left clavicle, subsequent encounter for fracture with routine healing: Secondary | ICD-10-CM

## 2023-02-03 DIAGNOSIS — Z5900 Homelessness unspecified: Secondary | ICD-10-CM

## 2023-02-03 DIAGNOSIS — I700 Atherosclerosis of aorta: Secondary | ICD-10-CM | POA: Diagnosis present

## 2023-02-03 DIAGNOSIS — Z85828 Personal history of other malignant neoplasm of skin: Secondary | ICD-10-CM

## 2023-02-03 DIAGNOSIS — Y903 Blood alcohol level of 60-79 mg/100 ml: Secondary | ICD-10-CM | POA: Diagnosis present

## 2023-02-03 DIAGNOSIS — F1010 Alcohol abuse, uncomplicated: Secondary | ICD-10-CM | POA: Diagnosis present

## 2023-02-03 DIAGNOSIS — E559 Vitamin D deficiency, unspecified: Secondary | ICD-10-CM | POA: Diagnosis present

## 2023-02-03 DIAGNOSIS — Z823 Family history of stroke: Secondary | ICD-10-CM

## 2023-02-03 DIAGNOSIS — I080 Rheumatic disorders of both mitral and aortic valves: Secondary | ICD-10-CM | POA: Diagnosis present

## 2023-02-03 DIAGNOSIS — I2693 Single subsegmental pulmonary embolism without acute cor pulmonale: Principal | ICD-10-CM | POA: Diagnosis present

## 2023-02-03 DIAGNOSIS — W19XXXD Unspecified fall, subsequent encounter: Secondary | ICD-10-CM | POA: Diagnosis present

## 2023-02-03 DIAGNOSIS — I5043 Acute on chronic combined systolic (congestive) and diastolic (congestive) heart failure: Secondary | ICD-10-CM | POA: Diagnosis present

## 2023-02-03 DIAGNOSIS — Z91199 Patient's noncompliance with other medical treatment and regimen due to unspecified reason: Secondary | ICD-10-CM

## 2023-02-03 DIAGNOSIS — F22 Delusional disorders: Secondary | ICD-10-CM | POA: Diagnosis not present

## 2023-02-03 DIAGNOSIS — Z79899 Other long term (current) drug therapy: Secondary | ICD-10-CM

## 2023-02-03 DIAGNOSIS — Z5982 Transportation insecurity: Secondary | ICD-10-CM

## 2023-02-03 DIAGNOSIS — S2249XA Multiple fractures of ribs, unspecified side, initial encounter for closed fracture: Secondary | ICD-10-CM | POA: Diagnosis present

## 2023-02-03 DIAGNOSIS — E878 Other disorders of electrolyte and fluid balance, not elsewhere classified: Secondary | ICD-10-CM | POA: Diagnosis present

## 2023-02-03 DIAGNOSIS — R296 Repeated falls: Secondary | ICD-10-CM | POA: Diagnosis present

## 2023-02-03 DIAGNOSIS — Z8673 Personal history of transient ischemic attack (TIA), and cerebral infarction without residual deficits: Secondary | ICD-10-CM

## 2023-02-03 DIAGNOSIS — F17290 Nicotine dependence, other tobacco product, uncomplicated: Secondary | ICD-10-CM | POA: Diagnosis present

## 2023-02-03 DIAGNOSIS — N400 Enlarged prostate without lower urinary tract symptoms: Secondary | ICD-10-CM | POA: Diagnosis present

## 2023-02-03 LAB — BASIC METABOLIC PANEL
Anion gap: 13 (ref 5–15)
BUN: 7 mg/dL — ABNORMAL LOW (ref 8–23)
CO2: 14 mmol/L — ABNORMAL LOW (ref 22–32)
Calcium: 7.6 mg/dL — ABNORMAL LOW (ref 8.9–10.3)
Chloride: 94 mmol/L — ABNORMAL LOW (ref 98–111)
Creatinine, Ser: 0.82 mg/dL (ref 0.61–1.24)
GFR, Estimated: >60 mL/min (ref >60)
Glucose, Bld: 122 mg/dL — ABNORMAL HIGH (ref 70–99)
Potassium: 4.7 mmol/L (ref 3.5–5.1)
Sodium: 121 mmol/L — ABNORMAL LOW (ref 135–145)

## 2023-02-03 LAB — CBG MONITORING, ED
Glucose-Capillary: 108 mg/dL — ABNORMAL HIGH (ref 70–99)
Glucose-Capillary: 124 mg/dL — ABNORMAL HIGH (ref 70–99)
Glucose-Capillary: 177 mg/dL — ABNORMAL HIGH (ref 70–99)
Glucose-Capillary: 52 mg/dL — ABNORMAL LOW (ref 70–99)
Glucose-Capillary: 60 mg/dL — ABNORMAL LOW (ref 70–99)
Glucose-Capillary: 99 mg/dL (ref 70–99)

## 2023-02-03 LAB — I-STAT CHEM 8, ED
BUN: 8 mg/dL (ref 8–23)
Calcium, Ion: 1.07 mmol/L — ABNORMAL LOW (ref 1.15–1.40)
Chloride: 95 mmol/L — ABNORMAL LOW (ref 98–111)
Creatinine, Ser: 1 mg/dL (ref 0.61–1.24)
Glucose, Bld: 81 mg/dL (ref 70–99)
HCT: 34 % — ABNORMAL LOW (ref 39.0–52.0)
Hemoglobin: 11.6 g/dL — ABNORMAL LOW (ref 13.0–17.0)
Potassium: 3.6 mmol/L (ref 3.5–5.1)
Sodium: 124 mmol/L — ABNORMAL LOW (ref 135–145)
TCO2: 14 mmol/L — ABNORMAL LOW (ref 22–32)

## 2023-02-03 LAB — COMPREHENSIVE METABOLIC PANEL
ALT: 23 U/L (ref 0–44)
AST: 50 U/L — ABNORMAL HIGH (ref 15–41)
Albumin: 3.3 g/dL — ABNORMAL LOW (ref 3.5–5.0)
Alkaline Phosphatase: 138 U/L — ABNORMAL HIGH (ref 38–126)
Anion gap: 13 (ref 5–15)
BUN: 8 mg/dL (ref 8–23)
CO2: 14 mmol/L — ABNORMAL LOW (ref 22–32)
Calcium: 8 mg/dL — ABNORMAL LOW (ref 8.9–10.3)
Chloride: 96 mmol/L — ABNORMAL LOW (ref 98–111)
Creatinine, Ser: 1.01 mg/dL (ref 0.61–1.24)
GFR, Estimated: >60 mL/min (ref >60)
Glucose, Bld: 86 mg/dL (ref 70–99)
Potassium: 3.7 mmol/L (ref 3.5–5.1)
Sodium: 123 mmol/L — ABNORMAL LOW (ref 135–145)
Total Bilirubin: 1.4 mg/dL — ABNORMAL HIGH (ref 0.3–1.2)
Total Protein: 7.1 g/dL (ref 6.5–8.1)

## 2023-02-03 LAB — CBC WITH DIFFERENTIAL/PLATELET
Abs Immature Granulocytes: 0.02 10*3/uL (ref 0.00–0.07)
Basophils Absolute: 0.1 10*3/uL (ref 0.0–0.1)
Basophils Relative: 2 %
Eosinophils Absolute: 0.1 10*3/uL (ref 0.0–0.5)
Eosinophils Relative: 2 %
HCT: 28.6 % — ABNORMAL LOW (ref 39.0–52.0)
Hemoglobin: 9.6 g/dL — ABNORMAL LOW (ref 13.0–17.0)
Immature Granulocytes: 0 %
Lymphocytes Relative: 28 %
Lymphs Abs: 1.3 10*3/uL (ref 0.7–4.0)
MCH: 33.9 pg (ref 26.0–34.0)
MCHC: 33.6 g/dL (ref 30.0–36.0)
MCV: 101.1 fL — ABNORMAL HIGH (ref 80.0–100.0)
Monocytes Absolute: 0.6 10*3/uL (ref 0.1–1.0)
Monocytes Relative: 12 %
Neutro Abs: 2.6 10*3/uL (ref 1.7–7.7)
Neutrophils Relative %: 56 %
Platelets: 216 10*3/uL (ref 150–400)
RBC: 2.83 MIL/uL — ABNORMAL LOW (ref 4.22–5.81)
RDW: 21.3 % — ABNORMAL HIGH (ref 11.5–15.5)
WBC: 4.6 10*3/uL (ref 4.0–10.5)
nRBC: 0 % (ref 0.0–0.2)

## 2023-02-03 LAB — URINALYSIS, ROUTINE W REFLEX MICROSCOPIC
Bacteria, UA: NONE SEEN
Bilirubin Urine: NEGATIVE
Glucose, UA: NEGATIVE mg/dL
Hgb urine dipstick: NEGATIVE
Ketones, ur: NEGATIVE mg/dL
Leukocytes,Ua: NEGATIVE
Nitrite: NEGATIVE
Protein, ur: 30 mg/dL — AB
Specific Gravity, Urine: 1.044 — ABNORMAL HIGH (ref 1.005–1.030)
pH: 5 (ref 5.0–8.0)

## 2023-02-03 LAB — TROPONIN I (HIGH SENSITIVITY)
Troponin I (High Sensitivity): 11 ng/L (ref <18)
Troponin I (High Sensitivity): 8 ng/L (ref <18)

## 2023-02-03 LAB — RESP PANEL BY RT-PCR (RSV, FLU A&B, COVID)  RVPGX2
Influenza A by PCR: NEGATIVE
Influenza B by PCR: NEGATIVE
Resp Syncytial Virus by PCR: NEGATIVE
SARS Coronavirus 2 by RT PCR: NEGATIVE

## 2023-02-03 LAB — MAGNESIUM: Magnesium: 1.2 mg/dL — ABNORMAL LOW (ref 1.7–2.4)

## 2023-02-03 LAB — ETHANOL: Alcohol, Ethyl (B): 61 mg/dL — ABNORMAL HIGH (ref <10)

## 2023-02-03 LAB — BRAIN NATRIURETIC PEPTIDE: B Natriuretic Peptide: 817 pg/mL — ABNORMAL HIGH (ref 0.0–100.0)

## 2023-02-03 MED ORDER — AMIODARONE LOAD VIA INFUSION
150.0000 mg | Freq: Once | INTRAVENOUS | Status: AC
  Administered 2023-02-03: 150 mg via INTRAVENOUS
  Filled 2023-02-03: qty 83.34

## 2023-02-03 MED ORDER — FOLIC ACID 1 MG PO TABS
1.0000 mg | ORAL_TABLET | Freq: Every day | ORAL | Status: DC
  Administered 2023-02-04 – 2023-02-16 (×13): 1 mg via ORAL
  Filled 2023-02-03 (×13): qty 1

## 2023-02-03 MED ORDER — IOHEXOL 350 MG/ML SOLN
75.0000 mL | Freq: Once | INTRAVENOUS | Status: AC | PRN
  Administered 2023-02-03: 75 mL via INTRAVENOUS

## 2023-02-03 MED ORDER — ADULT MULTIVITAMIN W/MINERALS CH
1.0000 | ORAL_TABLET | Freq: Every day | ORAL | Status: DC
  Administered 2023-02-04 – 2023-02-16 (×13): 1 via ORAL
  Filled 2023-02-03 (×12): qty 1

## 2023-02-03 MED ORDER — LORAZEPAM 1 MG PO TABS: 1.0000 mg | ORAL_TABLET | ORAL | Status: AC | PRN

## 2023-02-03 MED ORDER — HEPARIN (PORCINE) 25000 UT/250ML-% IV SOLN
1550.0000 [IU]/h | INTRAVENOUS | Status: DC
  Administered 2023-02-03: 1150 [IU]/h via INTRAVENOUS
  Administered 2023-02-04 – 2023-02-05 (×3): 1550 [IU]/h via INTRAVENOUS
  Filled 2023-02-03 (×4): qty 250

## 2023-02-03 MED ORDER — HEPARIN BOLUS VIA INFUSION
4800.0000 [IU] | Freq: Once | INTRAVENOUS | Status: AC
  Administered 2023-02-03: 4800 [IU] via INTRAVENOUS
  Filled 2023-02-03: qty 4800

## 2023-02-03 MED ORDER — HYDROCORTISONE SOD SUC (PF) 100 MG IJ SOLR
100.0000 mg | Freq: Once | INTRAMUSCULAR | Status: AC
  Administered 2023-02-03: 100 mg via INTRAVENOUS
  Filled 2023-02-03: qty 2

## 2023-02-03 MED ORDER — MAGNESIUM SULFATE 2 GM/50ML IV SOLN
2.0000 g | Freq: Once | INTRAVENOUS | Status: AC
  Administered 2023-02-03: 2 g via INTRAVENOUS
  Filled 2023-02-03: qty 50

## 2023-02-03 MED ORDER — DEXTROSE 10 % IV BOLUS
250.0000 mL | Freq: Once | INTRAVENOUS | Status: AC
  Administered 2023-02-03: 250 mL via INTRAVENOUS

## 2023-02-03 MED ORDER — THIAMINE MONONITRATE 100 MG PO TABS: 100.0000 mg | ORAL_TABLET | Freq: Every day | ORAL | Status: DC

## 2023-02-03 MED ORDER — THIAMINE HCL 100 MG/ML IJ SOLN: 100.0000 mg | Freq: Every day | INTRAMUSCULAR | Status: DC

## 2023-02-03 MED ORDER — POTASSIUM CHLORIDE CRYS ER 20 MEQ PO TBCR
40.0000 meq | EXTENDED_RELEASE_TABLET | Freq: Once | ORAL | Status: AC
  Administered 2023-02-03: 40 meq via ORAL
  Filled 2023-02-03: qty 2

## 2023-02-03 MED ORDER — AMIODARONE HCL IN DEXTROSE 360-4.14 MG/200ML-% IV SOLN
30.0000 mg/h | INTRAVENOUS | Status: DC
  Administered 2023-02-04 – 2023-02-05 (×5): 30 mg/h via INTRAVENOUS
  Filled 2023-02-03 (×6): qty 200

## 2023-02-03 MED ORDER — LORAZEPAM 2 MG/ML IJ SOLN: 1.0000 mg | INTRAMUSCULAR | Status: AC | PRN

## 2023-02-03 MED ORDER — AMIODARONE HCL IN DEXTROSE 360-4.14 MG/200ML-% IV SOLN
60.0000 mg/h | INTRAVENOUS | Status: DC
  Administered 2023-02-03 (×2): 60 mg/h via INTRAVENOUS
  Filled 2023-02-03 (×2): qty 200
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
## 2023-02-03 NOTE — ED Notes (Addendum)
RN gave pt graham crackers and apple juice. MD notified of pt's CBG.

## 2023-02-03 NOTE — ED Triage Notes (Signed)
Pt to ED via EMS from store. Pt lives at [facility]. Pt initially called EMS for CP. Pt has been lethargic, but AAOx4 for EMS. Pt c/o SOB. Pt has hx of CHF. Pt has bilateral lower extremity swelling. Pt's CBG was 62 initially for EMS. EMS gave 50cc of D10 en route. Pt's CBG came up to 128 with EMS. Pt reports no PO intake since last night. Pt's HR was also 130s a fib RVR with EMS. EMS gave 100cc NS en route. HR did not change for EMS. Pt was slightly hypotensive with EMS with a manual BP of 106/80. Pt c/o abd and chest pain.   EMS: 130 HR 106/80 26 RR 62 CBG 99% RA

## 2023-02-03 NOTE — ED Notes (Signed)
Pt's HR 140s. RN notified Durwin Nora, MD.

## 2023-02-03 NOTE — ED Provider Notes (Signed)
  Physical Exam  BP 99/68   Pulse (!) 108   Temp 98.1 F (36.7 C) (Oral)   Resp (!) 23   SpO2 100%   Physical Exam Vitals and nursing note reviewed.  Constitutional:      General: He is not in acute distress.    Appearance: He is well-developed.  HENT:     Head: Normocephalic and atraumatic.  Eyes:     Conjunctiva/sclera: Conjunctivae normal.  Cardiovascular:     Rate and Rhythm: Tachycardia present. Rhythm irregular.     Heart sounds: No murmur heard. Pulmonary:     Effort: Pulmonary effort is normal. No respiratory distress.     Breath sounds: Normal breath sounds.  Abdominal:     Palpations: Abdomen is soft.     Tenderness: There is no abdominal tenderness.  Musculoskeletal:        General: No swelling.     Cervical back: Neck supple.     Right lower leg: Edema present.     Left lower leg: Edema present.  Skin:    General: Skin is warm and dry.     Capillary Refill: Capillary refill takes less than 2 seconds.  Neurological:     Mental Status: He is alert.  Psychiatric:        Mood and Affect: Mood normal.     Procedures  Procedures  ED Course / MDM   Clinical Course as of 02/03/23 2340  Wed Feb 03, 2023  1704 Here with A-fib RVR for medication nonadherence.  On amio drip.  Complains of diffuse pain, history of alcohol use.  CT scan and then admit. [JD]  1859 CT with PE [JD]    Clinical Course User Index [JD] Laurence Spates, MD   Medical Decision Making Amount and/or Complexity of Data Reviewed Labs: ordered. Radiology: ordered.  Risk Prescription drug management. Decision regarding hospitalization.   Briefly, patient with history of alcohol use, anxiety, hypertension, atrial fibrillation presented with swelling to his legs, shortness of breath.  Found to be in A-fib RVR, started on amio drip.  Labs notable for chronic hyponatremia, elevated BNP, hypomagnesemia.  Getting CT scans plan for admission.  CT scans reviewed, concern for PE.  Heparin  drip started.  I reviewed his labs, he does have non-anion gap metabolic acidosis.  Repeat labs pending now that he has been resuscitated.  On exam appears somewhat volume up.  He endorses some generalized pain and fatigue but otherwise feels well.  Discussed with hospitalist and admitted for management of A-fib RVR as well as pulmonary embolism.      Laurence Spates, MD 02/03/23 [phone]
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
## 2023-02-03 NOTE — ED Provider Notes (Signed)
[place] EMERGENCY DEPARTMENT AT [facility] Provider Note   CSN: [id number] Arrival date & time: 02/03/23  1251     History  Chief Complaint  Patient presents with   Shortness of Breath   Hypoglycemia    Anthony [name] is a 67 y.o. male.   Shortness of Breath Associated symptoms: abdominal pain and chest pain   Hypoglycemia Associated symptoms: shortness of breath   Patient presents for multiple complaints.  Medical history includes alcohol abuse, anxiety, HTN, homelessness, CVA.  Patient is currently homeless.  Earlier today, he walked into a office building requesting a call to 911.  With EMS, he endorsed pain in chest and abdomen.  He states that he has had worsening swelling in his legs and shortness of breath.  EMS noted atrial fibrillation with RVR.  His sugar was initially low in the range of 60.  He was given D10 with improved CBG.  Patient is a poor historian.  He is not able to say whether or not he has been taking home medications.  Per chart review, he does have a history of medication nonadherence..     Home Medications Prior to Admission medications   Medication Sig Start Date End Date Taking? Authorizing Provider  acetaminophen (TYLENOL) 500 MG tablet Take 1 tablet (500 mg total) by mouth every 8 (eight) hours as needed. Patient taking differently: Take 1,000 mg by mouth every 6 (six) hours as needed (for pain). 10/13/22   Leatha Gilding, MD  amiodarone (PACERONE) 200 MG tablet Take 1 tablet (200 mg total) by mouth daily. 12/27/22   Regalado, Belkys A, MD  amiodarone (PACERONE) 200 MG tablet Take 2 tablets (400 mg total) by mouth 2 (two) times daily for 10 days. 12/18/22 12/28/22  Regalado, Belkys A, MD  BAYER BACK & BODY PAIN EX ST 500-32.5 MG TABS Take 1-2 tablets by mouth every 6 (six) hours as needed (for pain).    [provider]  diltiazem (CARDIZEM CD) 120 MG 24 hr capsule Take 1 capsule (120 mg total) by mouth daily. 12/19/22   Regalado,  Jon Billings A, MD  ferrous sulfate 325 (65 FE) MG tablet Take 1 tablet (325 mg total) by mouth daily with breakfast. 12/18/22 01/17/23  Regalado, Jon Billings A, MD  folic acid (FOLVITE) 1 MG tablet Take 1 tablet (1 mg total) by mouth daily. 12/18/22   Regalado, Belkys A, MD  loratadine (CLARITIN) 10 MG tablet Take 1 tablet (10 mg total) by mouth daily. 12/19/22   Regalado, Belkys A, MD  magnesium oxide (MAG-OX) 400 (240 Mg) MG tablet Take 1 tablet (400 mg total) by mouth 2 (two) times daily. 12/18/22   Regalado, Belkys A, MD  methocarbamol (ROBAXIN) 500 MG tablet Take 1 tablet (500 mg total) by mouth every 6 (six) hours as needed for muscle spasms. 12/18/22   Regalado, Belkys A, MD  metoprolol succinate (TOPROL-XL) 25 MG 24 hr tablet Take 1 tablet (25 mg total) by mouth 2 (two) times daily. 12/18/22 03/18/23  Regalado, Belkys A, MD  midodrine (PROAMATINE) 10 MG tablet Take 1 tablet (10 mg total) by mouth 3 (three) times daily with meals. 12/18/22   Regalado, Belkys A, MD  mirtazapine (REMERON) 15 MG tablet Take 1 tablet (15 mg total) by mouth at bedtime. 12/18/22   Regalado, Belkys A, MD  mometasone-formoterol (DULERA) 200-5 MCG/ACT AERO Inhale 2 puffs into the lungs 2 (two) times daily. 12/18/22   Regalado, Prentiss Bells, MD  Multiple Vitamins-Minerals (CENTRUM SILVER 50+MEN)  TABS Take 1 tablet by mouth every other day. 12/18/22   Regalado, Belkys A, MD  Nutritional Supplements (ENSURE HIGH PROTEIN) LIQD Take 237 mLs by mouth daily.    [provider]  rivaroxaban (XARELTO) 20 MG TABS tablet Take 1 tablet (20 mg total) by mouth daily with supper. 12/18/22   Regalado, Belkys A, MD  rosuvastatin (CRESTOR) 10 MG tablet Take 1 tablet (10 mg total) by mouth daily. 12/18/22   Regalado, Belkys A, MD  TYLENOL PM EXTRA STRENGTH 500-25 MG TABS tablet Take 2 tablets by mouth at bedtime as needed (for sleep).    [provider]      Allergies    Other and Poison ivy extract    Review of Systems   Review of Systems   Constitutional:  Positive for fatigue.  Respiratory:  Positive for shortness of breath.   Cardiovascular:  Positive for chest pain and leg swelling.  Gastrointestinal:  Positive for abdominal pain.  All other systems reviewed and are negative.   Physical Exam Updated Vital Signs BP 91/79   Pulse (!) 107   Temp 98.2 F (36.8 C) (Oral)   Resp (!) 25   SpO2 100%  Physical Exam Vitals and nursing note reviewed.  Constitutional:      General: He is not in acute distress.    Appearance: He is well-developed. He is ill-appearing (Chronically). He is not toxic-appearing or diaphoretic.  HENT:     Head: Normocephalic and atraumatic.  Eyes:     Conjunctiva/sclera: Conjunctivae normal.  Cardiovascular:     Rate and Rhythm: Tachycardia present. Rhythm irregular.     Heart sounds: No murmur heard. Pulmonary:     Effort: Pulmonary effort is normal. No tachypnea, accessory muscle usage or respiratory distress.     Breath sounds: Decreased breath sounds present. No wheezing, rhonchi or rales.  Chest:     Chest wall: No tenderness.  Abdominal:     Palpations: Abdomen is soft.     Tenderness: There is no abdominal tenderness.  Musculoskeletal:        General: No swelling.     Cervical back: Normal range of motion and neck supple.     Right lower leg: Edema present.     Left lower leg: Edema present.  Skin:    General: Skin is warm and dry.     Capillary Refill: Capillary refill takes less than 2 seconds.     Coloration: Skin is not cyanotic or pale.  Neurological:     General: No focal deficit present.     Mental Status: He is alert and oriented to person, place, and time.  Psychiatric:        Mood and Affect: Affect is angry.        Speech: Speech normal.     ED Results / Procedures / Treatments   Labs (all labs ordered are listed, but only abnormal results are displayed) Labs Reviewed  COMPREHENSIVE METABOLIC PANEL - Abnormal; Notable for the following components:       Result Value   Sodium 123 (*)    Chloride 96 (*)    CO2 14 (*)    Calcium 8.0 (*)    Albumin 3.3 (*)    AST 50 (*)    Alkaline Phosphatase 138 (*)    Total Bilirubin 1.4 (*)    All other components within normal limits  CBC WITH DIFFERENTIAL/PLATELET - Abnormal; Notable for the following components:   RBC 2.83 (*)  Hemoglobin 9.6 (*)    HCT 28.6 (*)    MCV 101.1 (*)    RDW 21.3 (*)    All other components within normal limits  BRAIN NATRIURETIC PEPTIDE - Abnormal; Notable for the following components:   B Natriuretic Peptide 817.0 (*)    All other components within normal limits  MAGNESIUM - Abnormal; Notable for the following components:   Magnesium 1.2 (*)    All other components within normal limits  ETHANOL - Abnormal; Notable for the following components:   Alcohol, Ethyl (B) 61 (*)    All other components within normal limits  I-STAT CHEM 8, ED - Abnormal; Notable for the following components:   Sodium 124 (*)    Chloride 95 (*)    Calcium, Ion 1.07 (*)    TCO2 14 (*)    Hemoglobin 11.6 (*)    HCT 34.0 (*)    All other components within normal limits  CBG MONITORING, ED - Abnormal; Notable for the following components:   Glucose-Capillary 52 (*)    All other components within normal limits  CBG MONITORING, ED - Abnormal; Notable for the following components:   Glucose-Capillary 60 (*)    All other components within normal limits  CBG MONITORING, ED - Abnormal; Notable for the following components:   Glucose-Capillary 177 (*)    All other components within normal limits  RESP PANEL BY RT-PCR (RSV, FLU A&B, COVID)  RVPGX2  URINALYSIS, ROUTINE W REFLEX MICROSCOPIC  CBG MONITORING, ED  TROPONIN I (HIGH SENSITIVITY)  TROPONIN I (HIGH SENSITIVITY)    EKG EKG Interpretation Date/Time:  Wednesday February 03 2023 13:06:54 EDT Ventricular Rate:  115 PR Interval:    QRS Duration:  104 QT Interval:  362 QTC Calculation: 501 R Axis:   95  Text  Interpretation: Atrial fibrillation Right axis deviation Prolonged QT interval Confirmed by Gloris Manchester [phone]) on 02/03/2023 4:46:49 PM  Radiology DG Chest Port 1 View  Result Date: 02/03/2023 CLINICAL DATA:  Shortness of breath and chest pain.  Lethargy. EXAM: PORTABLE CHEST 1 VIEW COMPARISON:  12/02/2022 FINDINGS: Cardiomegaly. Tortuous aorta. Pulmonary venous hypertension, possibly with early interstitial edema. No infiltrate or collapse. No acute bone finding. IMPRESSION: Cardiomegaly. Pulmonary venous hypertension, possibly with early interstitial edema. Electronically Signed   By: Paulina Fusi M.D.   On: 02/03/2023 14:35    Procedures Procedures    Medications Ordered in ED Medications  amiodarone (NEXTERONE) 1.8 mg/mL load via infusion 150 mg (150 mg Intravenous Bolus from Bag 02/03/23 1555)    Followed by  amiodarone (NEXTERONE PREMIX) 360-4.14 MG/200ML-% (1.8 mg/mL) IV infusion (60 mg/hr Intravenous New Bag/Given 02/03/23 1555)    Followed by  amiodarone (NEXTERONE PREMIX) 360-4.14 MG/200ML-% (1.8 mg/mL) IV infusion (has no administration in time range)  hydrocortisone sodium succinate (SOLU-CORTEF) 100 MG injection 100 mg (has no administration in time range)  dextrose (D10W) 10% bolus 250 mL (0 mLs Intravenous Stopped 02/03/23 1549)  magnesium sulfate IVPB 2 g 50 mL (0 g Intravenous Stopped 02/03/23 1641)  potassium chloride SA (KLOR-CON M) CR tablet 40 mEq (40 mEq Oral Given 02/03/23 1536)    ED Course/ Medical Decision Making/ A&P                             Medical Decision Making Amount and/or Complexity of Data Reviewed Labs: ordered. Radiology: ordered.  Risk Prescription drug management.   This patient presents to the ED for concern  of multiple complaints, this involves an extensive number of treatment options, and is a complaint that carries with it a high risk of complications and morbidity.  The differential diagnosis includes intoxication, arrhythmia, infection,  metabolic derangements, ACS, enteritis   Co morbidities that complicate the patient evaluation  alcohol abuse, anxiety, HTN, homelessness, CVA   Additional history obtained:  Additional history obtained from EMS External records from outside source obtained and reviewed including EMR   Lab Tests:  I Ordered, and personally interpreted labs.  The pertinent results include: Hypomagnesemia, hyponatremia and hypochloremia are present.  Kidney function is preserved.  A non-anion gap metabolic acidosis is present.  Anemia appears to be baseline.  No leukocytosis is present. Ethanol is only slightly elevated, not consistent with intoxication.  BNP is elevated greater than baseline.  Troponin is normal.   Imaging Studies ordered:  I ordered imaging studies including  chest x-ray, CT of head, abdomen pelvis, CTA chest  I independently visualized and interpreted imaging which showed no acute findings on x-ray, CT imaging pending at time of signout. I agree with the radiologist interpretation   Cardiac Monitoring: / EKG:  The patient was maintained on a cardiac monitor.  I personally viewed and interpreted the cardiac monitored which showed an underlying rhythm of: Atrial fibrillation  Problem List / ED Course / Critical interventions / Medication management  Patient presenting for shortness of breath, chest pain, abdominal pain.  He arrives via EMS who noted atrial fibrillation with RVR.  He has a history of this.  He is prescribed Xarelto, amiodarone, metoprolol, diltiazem.  Medication adherence is unclear due to patient being a poor historian.  On exam, patient has significant pitting edema to bilateral lower extremities, extending up to area of scrotum and penis.  EKG on arrival shows atrial fibrillation with heart rate in the range of 115.  He is normotensive.  SpO2 is normal on room air.  Workup was initiated.  Lab work is notable findings detailed above.  Replacement magnesium was  ordered.  Amiodarone was ordered for atrial fibrillation with RVR.  Patient had recurrence of hypoglycemia and D10 was ordered.  Patient's BNP was elevated but will hold off on Lasix for now given his hyponatremia.  Will hold off on heparin pending CT scan of the head.  Care of patient was signed out to oncoming ED provider. I ordered medication including amiodarone for atrial fibrillation with RVR, magnesium sulfate for hypomagnesemia; D10 for hypoglycemia; potassium chloride for electrolyte optimization Reevaluation of the patient after these medicines showed that the patient improved I have reviewed the patients home medicines and have made adjustments as needed   Social Determinants of Health:  Homeless, history of medication nonadherence, history of alcohol abuse  CRITICAL CARE Performed by: Gloris Manchester   Total critical care time: 35 minutes  Critical care time was exclusive of separately billable procedures and treating other patients.  Critical care was necessary to treat or prevent imminent or life-threatening deterioration.  Critical care was time spent personally by me on the following activities: development of treatment plan with patient and/or surrogate as well as nursing, discussions with consultants, evaluation of patient's response to treatment, examination of patient, obtaining history from patient or surrogate, ordering and performing treatments and interventions, ordering and review of laboratory studies, ordering and review of radiographic studies, pulse oximetry and re-evaluation of patient's condition.        Final Clinical Impression(s) / ED Diagnoses Final diagnoses:  Atrial fibrillation with RVR (HCC)  Hypoglycemia  Hypomagnesemia  Hyponatremia    Rx / DC Orders ED Discharge Orders     None         Gloris Manchester, MD 02/03/23 1650

## 2023-02-03 NOTE — Subjective & Objective (Signed)
Hx of EtoH abuse and homelessness  Initially reported CP and ABD pain EMS noted A.fib w RVR  And hypoglycemia in 60 He was given D10 with improved CBG  Noted non compliant w home meds Given IV amiodarone mag and K

## 2023-02-03 NOTE — ED Notes (Signed)
RN gave pt crackers, [place] sandwich, and apple juice. MD notified of pt's CBG.

## 2023-02-04 ENCOUNTER — Encounter (HOSPITAL_COMMUNITY): Payer: Self-pay | Admitting: Family Medicine

## 2023-02-04 ENCOUNTER — Observation Stay (HOSPITAL_COMMUNITY): Payer: 59

## 2023-02-04 ENCOUNTER — Other Ambulatory Visit: Payer: Self-pay

## 2023-02-04 DIAGNOSIS — I480 Paroxysmal atrial fibrillation: Secondary | ICD-10-CM

## 2023-02-04 DIAGNOSIS — I110 Hypertensive heart disease with heart failure: Secondary | ICD-10-CM | POA: Diagnosis present

## 2023-02-04 DIAGNOSIS — Z86711 Personal history of pulmonary embolism: Secondary | ICD-10-CM | POA: Diagnosis not present

## 2023-02-04 DIAGNOSIS — E874 Mixed disorder of acid-base balance: Secondary | ICD-10-CM | POA: Diagnosis present

## 2023-02-04 DIAGNOSIS — I2693 Single subsegmental pulmonary embolism without acute cor pulmonale: Secondary | ICD-10-CM | POA: Diagnosis present

## 2023-02-04 DIAGNOSIS — I2694 Multiple subsegmental pulmonary emboli without acute cor pulmonale: Secondary | ICD-10-CM | POA: Diagnosis not present

## 2023-02-04 DIAGNOSIS — Z781 Physical restraint status: Secondary | ICD-10-CM | POA: Diagnosis not present

## 2023-02-04 DIAGNOSIS — I4891 Unspecified atrial fibrillation: Secondary | ICD-10-CM

## 2023-02-04 DIAGNOSIS — E162 Hypoglycemia, unspecified: Secondary | ICD-10-CM | POA: Diagnosis present

## 2023-02-04 DIAGNOSIS — D638 Anemia in other chronic diseases classified elsewhere: Secondary | ICD-10-CM | POA: Diagnosis present

## 2023-02-04 DIAGNOSIS — Y903 Blood alcohol level of 60-79 mg/100 ml: Secondary | ICD-10-CM | POA: Diagnosis present

## 2023-02-04 DIAGNOSIS — F1010 Alcohol abuse, uncomplicated: Secondary | ICD-10-CM | POA: Diagnosis present

## 2023-02-04 DIAGNOSIS — Z1152 Encounter for screening for COVID-19: Secondary | ICD-10-CM | POA: Diagnosis not present

## 2023-02-04 DIAGNOSIS — I9589 Other hypotension: Secondary | ICD-10-CM | POA: Diagnosis present

## 2023-02-04 DIAGNOSIS — N400 Enlarged prostate without lower urinary tract symptoms: Secondary | ICD-10-CM | POA: Diagnosis present

## 2023-02-04 DIAGNOSIS — I2699 Other pulmonary embolism without acute cor pulmonale: Secondary | ICD-10-CM | POA: Diagnosis not present

## 2023-02-04 DIAGNOSIS — I5082 Biventricular heart failure: Secondary | ICD-10-CM | POA: Diagnosis present

## 2023-02-04 DIAGNOSIS — G9341 Metabolic encephalopathy: Secondary | ICD-10-CM | POA: Diagnosis not present

## 2023-02-04 DIAGNOSIS — W19XXXD Unspecified fall, subsequent encounter: Secondary | ICD-10-CM | POA: Diagnosis present

## 2023-02-04 DIAGNOSIS — R0602 Shortness of breath: Secondary | ICD-10-CM | POA: Diagnosis not present

## 2023-02-04 DIAGNOSIS — E871 Hypo-osmolality and hyponatremia: Secondary | ICD-10-CM | POA: Diagnosis present

## 2023-02-04 DIAGNOSIS — I272 Pulmonary hypertension, unspecified: Secondary | ICD-10-CM | POA: Diagnosis present

## 2023-02-04 DIAGNOSIS — Z5900 Homelessness unspecified: Secondary | ICD-10-CM | POA: Diagnosis not present

## 2023-02-04 DIAGNOSIS — E785 Hyperlipidemia, unspecified: Secondary | ICD-10-CM | POA: Diagnosis present

## 2023-02-04 DIAGNOSIS — I080 Rheumatic disorders of both mitral and aortic valves: Secondary | ICD-10-CM | POA: Diagnosis present

## 2023-02-04 DIAGNOSIS — I700 Atherosclerosis of aorta: Secondary | ICD-10-CM | POA: Diagnosis present

## 2023-02-04 DIAGNOSIS — S2232XA Fracture of one rib, left side, initial encounter for closed fracture: Secondary | ICD-10-CM | POA: Diagnosis present

## 2023-02-04 DIAGNOSIS — I5043 Acute on chronic combined systolic (congestive) and diastolic (congestive) heart failure: Secondary | ICD-10-CM | POA: Diagnosis present

## 2023-02-04 DIAGNOSIS — W19XXXA Unspecified fall, initial encounter: Secondary | ICD-10-CM | POA: Diagnosis present

## 2023-02-04 DIAGNOSIS — F22 Delusional disorders: Secondary | ICD-10-CM | POA: Diagnosis not present

## 2023-02-04 DIAGNOSIS — S2249XA Multiple fractures of ribs, unspecified side, initial encounter for closed fracture: Secondary | ICD-10-CM | POA: Diagnosis present

## 2023-02-04 DIAGNOSIS — S27321A Contusion of lung, unilateral, initial encounter: Secondary | ICD-10-CM | POA: Diagnosis present

## 2023-02-04 DIAGNOSIS — E860 Dehydration: Secondary | ICD-10-CM | POA: Diagnosis present

## 2023-02-04 LAB — COMPREHENSIVE METABOLIC PANEL
ALT: 23 U/L (ref 0–44)
AST: 49 U/L — ABNORMAL HIGH (ref 15–41)
Albumin: 2.9 g/dL — ABNORMAL LOW (ref 3.5–5.0)
Alkaline Phosphatase: 140 U/L — ABNORMAL HIGH (ref 38–126)
Anion gap: 16 — ABNORMAL HIGH (ref 5–15)
BUN: 6 mg/dL — ABNORMAL LOW (ref 8–23)
CO2: 12 mmol/L — ABNORMAL LOW (ref 22–32)
Calcium: 7.5 mg/dL — ABNORMAL LOW (ref 8.9–10.3)
Chloride: 93 mmol/L — ABNORMAL LOW (ref 98–111)
Creatinine, Ser: 0.82 mg/dL (ref 0.61–1.24)
GFR, Estimated: >60 mL/min (ref >60)
Glucose, Bld: 133 mg/dL — ABNORMAL HIGH (ref 70–99)
Potassium: 4.7 mmol/L (ref 3.5–5.1)
Sodium: 121 mmol/L — ABNORMAL LOW (ref 135–145)
Total Bilirubin: 1.2 mg/dL (ref 0.3–1.2)
Total Protein: 6.4 g/dL — ABNORMAL LOW (ref 6.5–8.1)

## 2023-02-04 LAB — BASIC METABOLIC PANEL
Anion gap: 13 (ref 5–15)
Anion gap: 9 (ref 5–15)
Anion gap: 11 (ref 5–15)
BUN: 7 mg/dL — ABNORMAL LOW (ref 8–23)
BUN: 8 mg/dL (ref 8–23)
BUN: 7 mg/dL — ABNORMAL LOW (ref 8–23)
CO2: 14 mmol/L — ABNORMAL LOW (ref 22–32)
CO2: 15 mmol/L — ABNORMAL LOW (ref 22–32)
CO2: 14 mmol/L — ABNORMAL LOW (ref 22–32)
Calcium: 7.5 mg/dL — ABNORMAL LOW (ref 8.9–10.3)
Calcium: 7 mg/dL — ABNORMAL LOW (ref 8.9–10.3)
Calcium: 7.1 mg/dL — ABNORMAL LOW (ref 8.9–10.3)
Chloride: 95 mmol/L — ABNORMAL LOW (ref 98–111)
Chloride: 97 mmol/L — ABNORMAL LOW (ref 98–111)
Chloride: 98 mmol/L (ref 98–111)
Creatinine, Ser: 0.79 mg/dL (ref 0.61–1.24)
Creatinine, Ser: 0.81 mg/dL (ref 0.61–1.24)
Creatinine, Ser: 0.75 mg/dL (ref 0.61–1.24)
GFR, Estimated: >60 mL/min (ref >60)
GFR, Estimated: >60 mL/min (ref >60)
GFR, Estimated: >60 mL/min (ref >60)
Glucose, Bld: 135 mg/dL — ABNORMAL HIGH (ref 70–99)
Glucose, Bld: 123 mg/dL — ABNORMAL HIGH (ref 70–99)
Glucose, Bld: 114 mg/dL — ABNORMAL HIGH (ref 70–99)
Potassium: 4.5 mmol/L (ref 3.5–5.1)
Potassium: 4.1 mmol/L (ref 3.5–5.1)
Potassium: 3.9 mmol/L (ref 3.5–5.1)
Sodium: 122 mmol/L — ABNORMAL LOW (ref 135–145)
Sodium: 121 mmol/L — ABNORMAL LOW (ref 135–145)
Sodium: 123 mmol/L — ABNORMAL LOW (ref 135–145)

## 2023-02-04 LAB — I-STAT VENOUS BLOOD GAS, ED
Acid-base deficit: 11 mmol/L — ABNORMAL HIGH (ref 0.0–2.0)
Bicarbonate: 13.6 mmol/L — ABNORMAL LOW (ref 20.0–28.0)
Calcium, Ion: 0.9 mmol/L — ABNORMAL LOW (ref 1.15–1.40)
HCT: 32 % — ABNORMAL LOW (ref 39.0–52.0)
Hemoglobin: 10.9 g/dL — ABNORMAL LOW (ref 13.0–17.0)
O2 Saturation: 71 %
Potassium: 5.4 mmol/L — ABNORMAL HIGH (ref 3.5–5.1)
Sodium: 124 mmol/L — ABNORMAL LOW (ref 135–145)
TCO2: 14 mmol/L — ABNORMAL LOW (ref 22–32)
pCO2, Ven: 24.7 mmHg — ABNORMAL LOW (ref 44–60)
pH, Ven: 7.348 (ref 7.25–7.43)
pO2, Ven: 38 mmHg (ref 32–45)

## 2023-02-04 LAB — CBC
HCT: 28.8 % — ABNORMAL LOW (ref 39.0–52.0)
Hemoglobin: 9.6 g/dL — ABNORMAL LOW (ref 13.0–17.0)
MCH: 33.9 pg (ref 26.0–34.0)
MCHC: 33.3 g/dL (ref 30.0–36.0)
MCV: 101.8 fL — ABNORMAL HIGH (ref 80.0–100.0)
Platelets: 236 10*3/uL (ref 150–400)
RBC: 2.83 MIL/uL — ABNORMAL LOW (ref 4.22–5.81)
RDW: 21.5 % — ABNORMAL HIGH (ref 11.5–15.5)
WBC: 5 10*3/uL (ref 4.0–10.5)
nRBC: 0 % (ref 0.0–0.2)

## 2023-02-04 LAB — LACTIC ACID, PLASMA
Lactic Acid, Venous: 3.3 mmol/L (ref 0.5–1.9)
Lactic Acid, Venous: 2 mmol/L (ref 0.5–1.9)

## 2023-02-04 LAB — PREALBUMIN: Prealbumin: <5 mg/dL — ABNORMAL LOW (ref 18–38)

## 2023-02-04 LAB — HEPARIN LEVEL (UNFRACTIONATED)
Heparin Unfractionated: 0.1 IU/mL — ABNORMAL LOW (ref 0.30–0.70)
Heparin Unfractionated: <0.1 IU/mL — ABNORMAL LOW (ref 0.30–0.70)
Heparin Unfractionated: 0.15 IU/mL — ABNORMAL LOW (ref 0.30–0.70)
Heparin Unfractionated: 0.59 IU/mL (ref 0.30–0.70)

## 2023-02-04 LAB — IRON AND TIBC
Iron: 21 ug/dL — ABNORMAL LOW (ref 45–182)
Saturation Ratios: 6 % — ABNORMAL LOW (ref 17.9–39.5)
TIBC: 353 ug/dL (ref 250–450)
UIBC: 332 ug/dL

## 2023-02-04 LAB — BLOOD GAS, VENOUS
Acid-base deficit: 9.8 mmol/L — ABNORMAL HIGH (ref 0.0–2.0)
Bicarbonate: 15.1 mmol/L — ABNORMAL LOW (ref 20.0–28.0)
Drawn by: 6061
O2 Saturation: 79.4 %
Patient temperature: 36.5
pCO2, Ven: 29 mmHg — ABNORMAL LOW (ref 44–60)
pH, Ven: 7.32 (ref 7.25–7.43)
pO2, Ven: 50 mmHg — ABNORMAL HIGH (ref 32–45)

## 2023-02-04 LAB — OSMOLALITY, URINE: Osmolality, Ur: 505 mOsm/kg (ref 300–900)

## 2023-02-04 LAB — HEPATIC FUNCTION PANEL
ALT: 23 U/L (ref 0–44)
AST: 45 U/L — ABNORMAL HIGH (ref 15–41)
Albumin: 2.8 g/dL — ABNORMAL LOW (ref 3.5–5.0)
Alkaline Phosphatase: 137 U/L — ABNORMAL HIGH (ref 38–126)
Bilirubin, Direct: 0.4 mg/dL — ABNORMAL HIGH (ref 0.0–0.2)
Indirect Bilirubin: 0.5 mg/dL (ref 0.3–0.9)
Total Bilirubin: 0.9 mg/dL (ref 0.3–1.2)
Total Protein: 6.4 g/dL — ABNORMAL LOW (ref 6.5–8.1)

## 2023-02-04 LAB — PHOSPHORUS
Phosphorus: 3.3 mg/dL (ref 2.5–4.6)
Phosphorus: 3.4 mg/dL (ref 2.5–4.6)

## 2023-02-04 LAB — RETICULOCYTES
Immature Retic Fract: 21.3 % — ABNORMAL HIGH (ref 2.3–15.9)
RBC.: 2.78 MIL/uL — ABNORMAL LOW (ref 4.22–5.81)
Retic Count, Absolute: 90.9 10*3/uL (ref 19.0–186.0)
Retic Ct Pct: 3.3 % — ABNORMAL HIGH (ref 0.4–3.1)

## 2023-02-04 LAB — URINALYSIS, COMPLETE (UACMP) WITH MICROSCOPIC
Bacteria, UA: NONE SEEN
Bilirubin Urine: NEGATIVE
Glucose, UA: NEGATIVE mg/dL
Hgb urine dipstick: NEGATIVE
Ketones, ur: NEGATIVE mg/dL
Leukocytes,Ua: NEGATIVE
Nitrite: NEGATIVE
Protein, ur: NEGATIVE mg/dL
Specific Gravity, Urine: 1.045 — ABNORMAL HIGH (ref 1.005–1.030)
pH: 5 (ref 5.0–8.0)

## 2023-02-04 LAB — FOLATE: Folate: 12.2 ng/mL (ref >5.9)

## 2023-02-04 LAB — CREATININE, URINE, RANDOM: Creatinine, Urine: 134 mg/dL

## 2023-02-04 LAB — ECHOCARDIOGRAM COMPLETE
Height: 69 in
S' Lateral: 3.8 cm
Weight: 2400 oz

## 2023-02-04 LAB — BRAIN NATRIURETIC PEPTIDE: B Natriuretic Peptide: 1077.8 pg/mL — ABNORMAL HIGH (ref 0.0–100.0)

## 2023-02-04 LAB — BETA-HYDROXYBUTYRIC ACID: Beta-Hydroxybutyric Acid: 1.01 mmol/L — ABNORMAL HIGH (ref 0.05–0.27)

## 2023-02-04 LAB — MAGNESIUM
Magnesium: 1.3 mg/dL — ABNORMAL LOW (ref 1.7–2.4)
Magnesium: 1.3 mg/dL — ABNORMAL LOW (ref 1.7–2.4)

## 2023-02-04 LAB — CBG MONITORING, ED
Glucose-Capillary: 122 mg/dL — ABNORMAL HIGH (ref 70–99)
Glucose-Capillary: 144 mg/dL — ABNORMAL HIGH (ref 70–99)

## 2023-02-04 LAB — OSMOLALITY: Osmolality: 259 mOsm/kg — ABNORMAL LOW (ref 275–295)

## 2023-02-04 LAB — VITAMIN B12: Vitamin B-12: 1497 pg/mL — ABNORMAL HIGH (ref 180–914)

## 2023-02-04 LAB — APTT: aPTT: 79 seconds — ABNORMAL HIGH (ref 24–36)

## 2023-02-04 LAB — AMMONIA: Ammonia: 32 umol/L (ref 9–35)

## 2023-02-04 LAB — CK: Total CK: 193 U/L (ref 49–397)

## 2023-02-04 LAB — FERRITIN: Ferritin: 74 ng/mL (ref 24–336)

## 2023-02-04 LAB — SODIUM, URINE, RANDOM: Sodium, Ur: <10 mmol/L

## 2023-02-04 LAB — TSH: TSH: 5.143 u[IU]/mL — ABNORMAL HIGH (ref 0.350–4.500)

## 2023-02-04 MED ORDER — ONDANSETRON HCL 4 MG PO TABS: 4.0000 mg | ORAL_TABLET | Freq: Four times a day (QID) | ORAL | Status: DC | PRN

## 2023-02-04 MED ORDER — ONDANSETRON HCL 4 MG/2ML IJ SOLN: 4.0000 mg | Freq: Four times a day (QID) | INTRAMUSCULAR | Status: DC | PRN

## 2023-02-04 MED ORDER — OXYCODONE HCL 5 MG PO TABS: 2.5000 mg | ORAL_TABLET | ORAL | Status: DC | PRN

## 2023-02-04 MED ORDER — SODIUM CHLORIDE 0.9 % IV SOLN: INTRAVENOUS | Status: DC

## 2023-02-04 MED ORDER — OXYCODONE HCL 5 MG PO TABS
5.0000 mg | ORAL_TABLET | ORAL | Status: DC | PRN
  Administered 2023-02-04 – 2023-02-05 (×2): 5 mg via ORAL
  Filled 2023-02-04 (×2): qty 1

## 2023-02-04 MED ORDER — THIAMINE MONONITRATE 100 MG PO TABS
100.0000 mg | ORAL_TABLET | Freq: Every day | ORAL | Status: DC
  Administered 2023-02-12 – 2023-02-16 (×5): 100 mg via ORAL
  Filled 2023-02-04 (×5): qty 1

## 2023-02-04 MED ORDER — THIAMINE HCL 100 MG/ML IJ SOLN
500.0000 mg | Freq: Three times a day (TID) | INTRAVENOUS | Status: AC
  Administered 2023-02-04 – 2023-02-06 (×9): 500 mg via INTRAVENOUS
  Filled 2023-02-04 (×11): qty 5

## 2023-02-04 MED ORDER — HEPARIN BOLUS VIA INFUSION
4000.0000 [IU] | Freq: Once | INTRAVENOUS | Status: AC
  Administered 2023-02-04: 4000 [IU] via INTRAVENOUS
  Filled 2023-02-04: qty 4000

## 2023-02-04 MED ORDER — FERROUS SULFATE 325 (65 FE) MG PO TABS
325.0000 mg | ORAL_TABLET | Freq: Every day | ORAL | Status: DC
  Administered 2023-02-04 – 2023-02-16 (×13): 325 mg via ORAL
  Filled 2023-02-04 (×13): qty 1

## 2023-02-04 MED ORDER — POLYETHYLENE GLYCOL 3350 17 G PO PACK
17.0000 g | PACK | Freq: Two times a day (BID) | ORAL | Status: DC
  Administered 2023-02-04 – 2023-02-15 (×19): 17 g via ORAL
  Filled 2023-02-04 (×24): qty 1

## 2023-02-04 MED ORDER — ACETAMINOPHEN 325 MG PO TABS: 650.0000 mg | ORAL_TABLET | Freq: Four times a day (QID) | ORAL | Status: DC | PRN

## 2023-02-04 MED ORDER — CHLORDIAZEPOXIDE HCL 25 MG PO CAPS
25.0000 mg | ORAL_CAPSULE | ORAL | Status: DC
  Administered 2023-02-06: 25 mg via ORAL
  Filled 2023-02-04 (×2): qty 1

## 2023-02-04 MED ORDER — FUROSEMIDE 10 MG/ML IJ SOLN
40.0000 mg | Freq: Two times a day (BID) | INTRAMUSCULAR | Status: DC
  Administered 2023-02-04 – 2023-02-05 (×3): 40 mg via INTRAVENOUS
  Filled 2023-02-04 (×3): qty 4

## 2023-02-04 MED ORDER — THIAMINE HCL 100 MG/ML IJ SOLN
250.0000 mg | Freq: Every day | INTRAVENOUS | Status: AC
  Administered 2023-02-07 – 2023-02-11 (×5): 250 mg via INTRAVENOUS
  Filled 2023-02-04 (×5): qty 2.5

## 2023-02-04 MED ORDER — ACETAMINOPHEN 650 MG RE SUPP: 650.0000 mg | Freq: Four times a day (QID) | RECTAL | Status: DC | PRN

## 2023-02-04 MED ORDER — ACETAMINOPHEN 500 MG PO TABS
1000.0000 mg | ORAL_TABLET | Freq: Three times a day (TID) | ORAL | Status: AC
  Administered 2023-02-04 – 2023-02-06 (×9): 1000 mg via ORAL
  Filled 2023-02-04 (×9): qty 2

## 2023-02-04 MED ORDER — ALBUTEROL SULFATE (2.5 MG/3ML) 0.083% IN NEBU: 2.5000 mg | INHALATION_SOLUTION | RESPIRATORY_TRACT | Status: DC | PRN

## 2023-02-04 MED ORDER — HEPARIN BOLUS VIA INFUSION
2000.0000 [IU] | Freq: Once | INTRAVENOUS | Status: AC
  Administered 2023-02-04: 2000 [IU] via INTRAVENOUS
  Filled 2023-02-04: qty 2000

## 2023-02-04 MED ORDER — MIDODRINE HCL 5 MG PO TABS
10.0000 mg | ORAL_TABLET | Freq: Three times a day (TID) | ORAL | Status: DC
  Administered 2023-02-04 – 2023-02-16 (×35): 10 mg via ORAL
  Filled 2023-02-04 (×36): qty 2

## 2023-02-04 MED ORDER — HYDROCODONE-ACETAMINOPHEN 5-325 MG PO TABS
1.0000 | ORAL_TABLET | ORAL | Status: DC | PRN
  Administered 2023-02-04: 2 via ORAL
  Filled 2023-02-04: qty 2

## 2023-02-04 MED ORDER — CHLORDIAZEPOXIDE HCL 25 MG PO CAPS
25.0000 mg | ORAL_CAPSULE | Freq: Three times a day (TID) | ORAL | Status: AC
  Administered 2023-02-05 (×3): 25 mg via ORAL
  Filled 2023-02-04 (×3): qty 1

## 2023-02-04 MED ORDER — CHLORDIAZEPOXIDE HCL 25 MG PO CAPS
25.0000 mg | ORAL_CAPSULE | Freq: Four times a day (QID) | ORAL | Status: AC
  Administered 2023-02-04 (×4): 25 mg via ORAL
  Filled 2023-02-04 (×3): qty 1

## 2023-02-04 MED ORDER — ACETAMINOPHEN 325 MG PO TABS
650.0000 mg | ORAL_TABLET | Freq: Four times a day (QID) | ORAL | Status: DC | PRN
  Administered 2023-02-14: 650 mg via ORAL
  Filled 2023-02-04: qty 2

## 2023-02-04 MED ORDER — ROSUVASTATIN CALCIUM 5 MG PO TABS
10.0000 mg | ORAL_TABLET | Freq: Every day | ORAL | Status: DC
  Administered 2023-02-04 – 2023-02-16 (×13): 10 mg via ORAL
  Filled 2023-02-04 (×13): qty 2

## 2023-02-04 MED ORDER — CHLORDIAZEPOXIDE HCL 25 MG PO CAPS: 25.0000 mg | ORAL_CAPSULE | Freq: Every day | ORAL | Status: DC

## 2023-02-04 MED ORDER — GUAIFENESIN ER 600 MG PO TB12
600.0000 mg | ORAL_TABLET | Freq: Two times a day (BID) | ORAL | Status: DC
  Administered 2023-02-04 – 2023-02-16 (×25): 600 mg via ORAL
  Filled 2023-02-04 (×26): qty 1

## 2023-02-04 MED ORDER — FUROSEMIDE 10 MG/ML IJ SOLN
20.0000 mg | Freq: Once | INTRAMUSCULAR | Status: AC
  Administered 2023-02-04: 20 mg via INTRAVENOUS
  Filled 2023-02-04: qty 2

## 2023-02-04 MED ORDER — LIDOCAINE 5 % EX PTCH
3.0000 | MEDICATED_PATCH | CUTANEOUS | Status: DC
  Administered 2023-02-04 – 2023-02-05 (×2): 3 via TRANSDERMAL
  Administered 2023-02-06: 1 via TRANSDERMAL
  Administered 2023-02-07 – 2023-02-11 (×4): 3 via TRANSDERMAL
  Filled 2023-02-04 (×10): qty 3

## 2023-02-04 MED ORDER — MOMETASONE FURO-FORMOTEROL FUM 200-5 MCG/ACT IN AERO
2.0000 | INHALATION_SPRAY | Freq: Two times a day (BID) | RESPIRATORY_TRACT | Status: DC
  Administered 2023-02-04 – 2023-02-16 (×15): 2 via RESPIRATORY_TRACT
  Filled 2023-02-04 (×2): qty 8.8

## 2023-02-04 NOTE — Progress Notes (Signed)
ANTICOAGULATION CONSULT NOTE - Follow-up Note  Pharmacy Consult for Heparin Indication: pulmonary embolus  Allergies  Allergen Reactions   Other Itching and Other (See Comments)    Seasonal allergies- Itchy eyes, runny nose, congestion   Poison Ivy Extract Rash    Patient Measurements: Height: 5\' 9"  (175.3 cm) Weight: 71.9 kg (158 lb 8.2 oz) IBW/kg (Calculated) : 70.7 Heparin Dosing Weight: 68 kg  Vital Signs: Temp: 98.1 F (36.7 C) (07/18 1537) Temp Source: Oral (07/18 1537) BP: 102/69 (07/18 1600) Pulse Rate: 88 (07/18 1537)  Labs: Recent Labs    02/03/23 1313 02/03/23 1405 02/03/23 1508 02/03/23 2252 02/04/23 0051 02/04/23 0226 02/04/23 0300 02/04/23 0807 02/04/23 1259 02/04/23 1828  HGB 9.6* 11.6*  --   --  9.6* 10.9*  --   --   --   --   HCT 28.6* 34.0*  --   --  28.8* 32.0*  --   --   --   --   PLT 216  --   --   --  236  --   --   --   --   --   APTT  --   --   --   --  79*  --   --   --   --   --   HEPARINUNFRC  --   --   --   --  <0.10*  0.10*  --   --  0.15*  --  0.59  CREATININE 1.01 1.00  --    < > 0.82  --  0.79  --  0.81 0.75  CKTOTAL  --   --   --   --  193  --   --   --   --   --   TROPONINIHS 11  --  8  --   --   --   --   --   --   --    < > = values in this interval not displayed.    Estimated Creatinine Clearance: 89.6 mL/min (by C-G formula based on SCr of 0.75 mg/dL).   Medical History: Past Medical History:  Diagnosis Date   Actinic keratosis 11/27/2016   Alcohol abuse with alcohol-induced mood disorder (HCC) 07/18/2018   Alcohol use disorder, severe, dependence (HCC) 09/16/2006   05/22/2015 Argumentative, belligerent and verbally abusive to staff per his note from [facility]    Alcoholism [facility])    Anxiety state 04/25/2018   Aortic atherosclerosis (HCC) 08/27/2022   Chronic low back pain 09/16/2006   Followed by NS.  Per his chart from [place], he fell off two storyhouse roof while cleaning his brother's gutters and  sustained compression fracture of L3, L4 and L5 and fracture of right transverse process at L3 and nondisplaced fracture of left glenoid rim many years ago. He had multiple imaging in [place] including Lumbar MRI in 2016 which showed moderate to severe spinal canal stenos   Delirium tremens (HCC) 09/01/2017   Depression    Dry eye 11/23/2016   Foreign body in middle portion of esophagus 05/19/2022   Hiatal hernia 09/14/2016   Status Collis gastroplasty and Nissen's fundoplication on 04/29/2015 in    History of delirium tremons 07/22/2020   DTs during 10/2016 08/2017. And 12/2017 admissions    History of pulmonary embolus (PE) 11/02/2016   Unprovoked 11/01/2016. Xarelto from 11/01/16 to 09/08/2017.   Hydrocele    Surgically corrected   Hypertension    Hypoalbuminemia due to protein-calorie malnutrition (HCC) 07/22/2020  Lumbar compression fracture (HCC)    Multiple rib fractures 07/22/2019   Left rib details XR: left ribs demonstrate multiple remote rib   Pancytopenia (HCC)    Rhabdomyolysis 07/22/2020   Skin cancer    exciced 2017   Tinea versicolor 06/08/2013   Tobacco use disorder 07/22/2020   Tooth infection 04/25/2018   Trigger finger, acquired 11/18/2012   Ulnar tunnel syndrome of right wrist 11/08/2012    Medications:  Medications Prior to Admission  Medication Sig Dispense Refill Last Dose   acetaminophen (TYLENOL) 500 MG tablet Take 1 tablet (500 mg total) by mouth every 8 (eight) hours as needed. (Patient taking differently: Take 1,000 mg by mouth every 6 (six) hours as needed (for pain).) 30 tablet 0 Past Week   amiodarone (PACERONE) 200 MG tablet Take 1 tablet (200 mg total) by mouth daily. 30 tablet 0    amiodarone (PACERONE) 200 MG tablet Take 2 tablets (400 mg total) by mouth 2 (two) times daily for 10 days. (Patient not taking: Reported on 02/03/2023) 40 tablet 0 Not Taking   diltiazem (CARDIZEM CD) 120 MG 24 hr capsule Take 1 capsule (120 mg total) by  mouth daily. (Patient not taking: Reported on 02/03/2023) 30 capsule 1 Not Taking   ferrous sulfate 325 (65 FE) MG tablet Take 1 tablet (325 mg total) by mouth daily with breakfast. 30 tablet 0    folic acid (FOLVITE) 1 MG tablet Take 1 tablet (1 mg total) by mouth daily. (Patient not taking: Reported on 02/03/2023) 30 tablet 0 Not Taking   loratadine (CLARITIN) 10 MG tablet Take 1 tablet (10 mg total) by mouth daily. (Patient not taking: Reported on 02/03/2023) 30 tablet 0 Not Taking   magnesium oxide (MAG-OX) 400 (240 Mg) MG tablet Take 1 tablet (400 mg total) by mouth 2 (two) times daily. (Patient not taking: Reported on 02/03/2023) 60 tablet 0 Not Taking   methocarbamol (ROBAXIN) 500 MG tablet Take 1 tablet (500 mg total) by mouth every 6 (six) hours as needed for muscle spasms. (Patient not taking: Reported on 02/03/2023) 40 tablet 0 Not Taking   metoprolol succinate (TOPROL-XL) 25 MG 24 hr tablet Take 1 tablet (25 mg total) by mouth 2 (two) times daily. (Patient not taking: Reported on 02/03/2023) 60 tablet 2 Not Taking   midodrine (PROAMATINE) 10 MG tablet Take 1 tablet (10 mg total) by mouth 3 (three) times daily with meals. (Patient not taking: Reported on 02/03/2023) 120 tablet 1 Not Taking   mirtazapine (REMERON) 15 MG tablet Take 1 tablet (15 mg total) by mouth at bedtime. (Patient not taking: Reported on 02/03/2023) 30 tablet 0 Not Taking   mometasone-formoterol (DULERA) 200-5 MCG/ACT AERO Inhale 2 puffs into the lungs 2 (two) times daily. (Patient not taking: Reported on 02/03/2023) 13 g 0 Not Taking   Multiple Vitamins-Minerals (CENTRUM SILVER 50+MEN) TABS Take 1 tablet by mouth every other day. (Patient not taking: Reported on 02/03/2023) 30 tablet 0 Not Taking   Nutritional Supplements (ENSURE HIGH PROTEIN) LIQD Take 237 mLs by mouth daily. (Patient not taking: Reported on 02/03/2023)   Not Taking   rivaroxaban (XARELTO) 20 MG TABS tablet Take 1 tablet (20 mg total) by mouth daily with supper.  (Patient not taking: Reported on 02/03/2023) 30 tablet 2 Not Taking   rosuvastatin (CRESTOR) 10 MG tablet Take 1 tablet (10 mg total) by mouth daily. (Patient not taking: Reported on 02/03/2023) 30 tablet 0 Not Taking   Scheduled:   acetaminophen  1,000 mg  Oral Q8H   chlordiazePOXIDE  25 mg Oral QID   Followed by   Melene Muller ON 02/05/2023] chlordiazePOXIDE  25 mg Oral TID   Followed by   Melene Muller ON 02/06/2023] chlordiazePOXIDE  25 mg Oral BH-qamhs   Followed by   Melene Muller ON 02/07/2023] chlordiazePOXIDE  25 mg Oral Daily   ferrous sulfate  325 mg Oral Q breakfast   folic acid  1 mg Oral Daily   furosemide  40 mg Intravenous BID   guaiFENesin  600 mg Oral BID   lidocaine  3 patch Transdermal Q24H   midodrine  10 mg Oral TID WC   mometasone-formoterol  2 puff Inhalation BID   multivitamin with minerals  1 tablet Oral Daily   polyethylene glycol  17 g Oral BID   rosuvastatin  10 mg Oral Daily   [START ON 02/12/2023] thiamine  100 mg Oral Daily   Infusions:   sodium chloride 100 mL/hr at 02/04/23 1129   amiodarone 30 mg/hr (02/04/23 1255)   heparin 1,550 Units/hr (02/04/23 1254)   thiamine (VITAMIN B1) injection Stopped (02/04/23 1506)   Followed by   Melene Muller ON 02/07/2023] thiamine (VITAMIN B1) injection     PRN: acetaminophen **FOLLOWED BY** [START ON 02/07/2023] acetaminophen, albuterol, LORazepam **OR** LORazepam, ondansetron **OR** ondansetron (ZOFRAN) IV, oxyCODONE **OR** oxyCODONE  Assessment: 67 yom with a history of Afib, hx of PE 2018. Heparin per pharmacy consult placed for pulmonary embolus.  Patient was not on anticoagulation prior to arrival. Started on 1350 units/hr IV heparin following 2000 unit IV heparin bolus. Resulting heparin level was sub-therapeutic.  Rate increased to 1550 units/hr IV heparin following 4000 unit IV heparin bolus. Resulting heparin level is 0.59 which is therapeutic.  No issues with infusion or bleeding per RN.  Hgb 10.9; plt 236  Goal of Therapy:   Heparin level 0.3-0.7 units/ml Monitor platelets by anticoagulation protocol: Yes   Plan:  Continue heparin infusion at 1550 units/hr Check anti-Xa level at 0200 and daily while on heparin Continue to monitor H&H and platelets F/u long term anticoagulation plan  Delmar Landau, PharmD, BCPS 02/04/2023 7:35 PM ED Clinical Pharmacist -  [phone]

## 2023-02-04 NOTE — Progress Notes (Signed)
PROGRESS NOTE    Anthony [name]  XBM:[id number] DOB: 01-20-1956 DOA: 02/03/2023 PCP: Pcp, No  Chief Complaint  Patient presents with   Shortness of Breath   Hypoglycemia    Brief Narrative:   Anthony [name] is Anthony [name] 67 y.o. male with medical history significant of  alcohol abuse, anxiety, HTN, homelessness, CVA who presented with chest pain and shortness of breath.    He was found to have Anthony [name] pulmonary embolism as well as atrial fibrillation with RVR and soft blood pressures.    Assessment & Plan:   Principal Problem:   Pulmonary embolism (HCC) Active Problems:   Alcohol abuse   HTN (hypertension)   Paroxysmal atrial fibrillation (HCC)   Hypomagnesemia   Atrial fibrillation with RVR (HCC)   Dyslipidemia   Dehydration   Hyponatremia   Rib fractures   Hypoglycemia  Acute Pulmonary Embolism Pulmonary Infarct Unclear provoking event, will need to be investigated further (needs to ensure his cancer screening up to date) CT PE protocol with acute segmental and subsegmental PE in posterior RLL with associated developing pulmonary infarct in the lateral basal segment Heparin gtt for now Echo and LE [place] pending  Acute Fracture of L Lateral 3rd Rib with Associated Pulmonary Contusion Unchanged Comminuted Fracture of the L Clavicular Head and Subacute Fracture of the L Lateral 5th Rib He notes frequent falls, suspect these as the result - the clavicular fracture was dx after fall on 6/19  PT/OT IS, pulmonary toilet Pain management, bowel regimen   Atrial Fibrillation with RVR Amiodarone, heparin gtt Supposed to be taking diltiazem and metoprolol as well (will hold off on restarting these in the setting of his low normal BP's) Apparently wasn't taking amiodarone or xarelto (related to medication nonadherence in setting of homelessness) TOC c/s medications  Hyponatremia Has bilateral LE edema on exam, suspect this represents hypervolemic hyponatremia vs related to blood clots in the  setting of his PE? Will diurese cautiously given presenting BP's and follow closely  Non anion gap metabolic acidosis  Unclear cause - will follow  VBG with respiratory alkalosis    Etoh Abuse Drinks 6 pack daily (suspect this is low end based on what he told me) Scheduled librium CIWA  Low threshold for phenobarb taper   Dyslipidemia Crestor  Hypomagnesemia Replace and follow  HTN BP's soft on presentation He's actually supposed to be on midodrine at home   Hypoglycemia Monitor, follow A1c    DVT prophylaxis: heparin gtt Code Status: full Family Communication: none Disposition:   Status is: Observation The patient will require care spanning > 2 midnights and should be moved to inpatient because: continued need for inpatient care   Consultants:  none  Procedures:  none  Antimicrobials:  Anti-infectives (From admission, onward)    None       Subjective: [name]&O Presented with SOB Can't tell me when he last fell, says he falls frequently  Objective: Vitals:   02/04/23 0745 02/04/23 0800 02/04/23 0815 02/04/23 0830  BP: (!) 118/90 (!) 116/95 109/84 115/85  Pulse: 94 (!) 103 97 100  Resp: (!) 23 (!) 21 19 19   Temp:      TempSrc:      SpO2: 99% 100% 100% 100%  Weight:      Height:       No intake or output data in the 24 hours ending 02/04/23 0916 Filed Weights   02/03/23 1900  Weight: 68 kg    Examination:  General exam: Appears calm and  comfortable  Respiratory system: unlabored, CTAB with anterior exam Cardiovascular system: RRR Gastrointestinal system: Abdomen is nondistended, soft and nontender. Central nervous system: Alert and oriented. No focal neurological deficits. Extremities: bilateral LE edema    Data Reviewed: I have personally reviewed following labs and imaging studies  CBC: Recent Labs  Lab 02/03/23 1313 02/03/23 1405 02/04/23 0051 02/04/23 0226  WBC 4.6  --  5.0  --   NEUTROABS 2.6  --   --   --   HGB 9.6* 11.6*  9.6* 10.9*  HCT 28.6* 34.0* 28.8* 32.0*  MCV 101.1*  --  101.8*  --   PLT 216  --  236  --     Basic Metabolic Panel: Recent Labs  Lab 02/03/23 1313 02/03/23 1405 02/03/23 2252 02/04/23 0051 02/04/23 0226 02/04/23 0300  NA 123* 124* 121* 121* 124* 122*  K 3.7 3.6 4.7 4.7 5.4* 4.5  CL 96* 95* 94* 93*  --  95*  CO2 14*  --  14* 12*  --  14*  GLUCOSE 86 81 122* 133*  --  135*  BUN 8 8 7* 6*  --  7*  CREATININE 1.01 1.00 0.82 0.82  --  0.79  CALCIUM 8.0*  --  7.6* 7.5*  --  7.5*  MG 1.2*  --   --  1.3*  1.3*  --   --   PHOS  --   --   --  3.4  3.3  --   --     GFR: Estimated Creatinine Clearance: 86.2 mL/min (by C-G formula based on SCr of 0.79 mg/dL).  Liver Function Tests: Recent Labs  Lab 02/03/23 1313 02/04/23 0051  AST 50* 49*  45*  ALT 23 23  23   ALKPHOS 138* 140*  137*  BILITOT 1.4* 1.2  0.9  PROT 7.1 6.4*  6.4*  ALBUMIN 3.3* 2.9*  2.8*    CBG: Recent Labs  Lab 02/03/23 1549 02/03/23 1813 02/03/23 2344 02/04/23 0247 02/04/23 0803  GLUCAP 177* 108* 124* 122* 144*     Recent Results (from the past 240 hour(s))  Resp panel by RT-PCR (RSV, Flu Caeleb Batalla&B, Covid) Anterior Nasal Swab     Status: None   Collection Time: 02/03/23  2:39 PM   Specimen: Anterior Nasal Swab  Result Value Ref Range Status   SARS Coronavirus 2 by RT PCR NEGATIVE NEGATIVE Final   Influenza [place] by PCR NEGATIVE NEGATIVE Final   Influenza B by PCR NEGATIVE NEGATIVE Final    Comment: (NOTE) The Xpert Xpress SARS-CoV-2/FLU/RSV plus assay is intended as an aid in the diagnosis of influenza from Nasopharyngeal swab specimens and should not be used as Mitsuo Budnick sole basis for treatment. Nasal washings and aspirates are unacceptable for Xpert Xpress SARS-CoV-2/FLU/RSV testing.  Fact Sheet for Patients: BloggerCourse.com  Fact Sheet for Healthcare Providers: SeriousBroker.it  This test is not yet approved or cleared by the [place] FDA  and has been authorized for detection and/or diagnosis of SARS-CoV-2 by FDA under an Emergency Use Authorization (EUA). This EUA will remain in effect (meaning this test can be used) for the duration of the COVID-19 declaration under Section 564(b)(1) of the Act, 21 U.S.C. section 360bbb-3(b)(1), unless the authorization is terminated or revoked.     Resp Syncytial Virus by PCR NEGATIVE NEGATIVE Final    Comment: (NOTE) Fact Sheet for Patients: BloggerCourse.com  Fact Sheet for Healthcare Providers: SeriousBroker.it  This test is not yet approved or cleared by the [place] FDA and has  been authorized for detection and/or diagnosis of SARS-CoV-2 by FDA under an Emergency Use Authorization (EUA). This EUA will remain in effect (meaning this test can be used) for the duration of the COVID-19 declaration under Section 564(b)(1) of the Act, 21 U.S.C. section 360bbb-3(b)(1), unless the authorization is terminated or revoked.  Performed at [name], [address], [place], [place] [address]          Radiology Studies: CT Angio Chest PE W and/or Wo Contrast  Result Date: 02/03/2023 CLINICAL DATA:  Pulmonary embolism (PE) suspected, high prob; Abdominal pain, acute, nonlocalized. EXAM: CT ANGIOGRAPHY CHEST CT ABDOMEN AND PELVIS WITH CONTRAST TECHNIQUE: Multidetector CT imaging of the chest was performed using the standard protocol during bolus administration of intravenous contrast. Multiplanar CT image reconstructions and MIPs were obtained to evaluate the vascular anatomy. Multidetector CT imaging of the abdomen and pelvis was performed using the standard protocol during bolus administration of intravenous contrast. RADIATION DOSE REDUCTION: This exam was performed according to the departmental dose-optimization program which includes automated exposure control, adjustment of the mA and/or kV according to patient size  and/or use of iterative reconstruction technique. CONTRAST:  75mL OMNIPAQUE IOHEXOL 350 MG/ML SOLN COMPARISON:  Chest CT 01/06/2023.  CT abdomen/pelvis 05/20/2022. FINDINGS: CTA CHEST FINDINGS Cardiovascular: Acute segmental and subsegmental pulmonary emboli in the posterior right lower lobe (for example, coronal image 46 series 8) with associated developing pulmonary infarct in the lateral basal segment (axial image 219 series 6). No evidence of acute right heart strain. Extensive coronary artery calcifications. Mediastinum/Nodes: Postoperative changes of prior hiatal hernia repair. No thoracic lymphadenopathy. Lungs/Pleura: Subpleural ground-glass opacity in the left upper lobe subjacent to an acute fracture of the left lateral third rib (axial image 39 series 7), consistent with pulmonary contusion. No pneumothorax. Mucous plugging and developing atelectasis in the medial basal segment of the right lower lobe. Musculoskeletal: Unchanged comminuted fracture of the left clavicular head and subacute fracture of the left lateral fifth rib. Review of the MIP images confirms the above findings. CT ABDOMEN and PELVIS FINDINGS Hepatobiliary: No focal liver abnormality is seen. No gallstones, gallbladder wall thickening, or biliary dilatation. Pancreas: Unremarkable. No pancreatic ductal dilatation or surrounding inflammatory changes. Spleen: Normal. Adrenals/Urinary Tract: Adrenal glands are unremarkable. Kidneys are normal, without renal calculi, focal lesion, or hydronephrosis. Bladder is unremarkable. Stomach/Bowel: Normal stomach and duodenum. No dilated loops of small bowel. Appendix is not visualized. Colonic diverticulosis without evidence of acute diverticulitis. Vascular/Lymphatic: Aortic atherosclerosis. No enlarged abdominal or pelvic lymph nodes. Reproductive: Prostate is unremarkable. Other: Diffuse body wall edema. Musculoskeletal: Unchanged mild anterior compression deformities of the L2 through L5  vertebral bodies. Review of the MIP images confirms the above findings. IMPRESSION: 1. Acute segmental and subsegmental pulmonary emboli in the posterior right lower lobe with associated developing pulmonary infarct in the lateral basal segment. No evidence of acute right heart strain. 2. Acute fracture of the left lateral third rib with associated pulmonary contusion in the left upper lobe. No pneumothorax. 3. Unchanged comminuted fracture of the left clavicular head and subacute fracture of the left lateral fifth rib. 4. No acute findings in the abdomen or pelvis. 5. Colonic diverticulosis without evidence of acute diverticulitis. Aortic Atherosclerosis (ICD10-I70.0). Electronically Signed   By: Orvan Falconer M.D.   On: 02/03/2023 18:31   CT ABDOMEN PELVIS W CONTRAST  Result Date: 02/03/2023 CLINICAL DATA:  Pulmonary embolism (PE) suspected, high prob; Abdominal pain, acute, nonlocalized. EXAM: CT ANGIOGRAPHY CHEST CT ABDOMEN AND PELVIS  WITH CONTRAST TECHNIQUE: Multidetector CT imaging of the chest was performed using the standard protocol during bolus administration of intravenous contrast. Multiplanar CT image reconstructions and MIPs were obtained to evaluate the vascular anatomy. Multidetector CT imaging of the abdomen and pelvis was performed using the standard protocol during bolus administration of intravenous contrast. RADIATION DOSE REDUCTION: This exam was performed according to the departmental dose-optimization program which includes automated exposure control, adjustment of the mA and/or kV according to patient size and/or use of iterative reconstruction technique. CONTRAST:  75mL OMNIPAQUE IOHEXOL 350 MG/ML SOLN COMPARISON:  Chest CT 01/06/2023.  CT abdomen/pelvis 05/20/2022. FINDINGS: CTA CHEST FINDINGS Cardiovascular: Acute segmental and subsegmental pulmonary emboli in the posterior right lower lobe (for example, coronal image 46 series 8) with associated developing pulmonary infarct in the  lateral basal segment (axial image 219 series 6). No evidence of acute right heart strain. Extensive coronary artery calcifications. Mediastinum/Nodes: Postoperative changes of prior hiatal hernia repair. No thoracic lymphadenopathy. Lungs/Pleura: Subpleural ground-glass opacity in the left upper lobe subjacent to an acute fracture of the left lateral third rib (axial image 39 series 7), consistent with pulmonary contusion. No pneumothorax. Mucous plugging and developing atelectasis in the medial basal segment of the right lower lobe. Musculoskeletal: Unchanged comminuted fracture of the left clavicular head and subacute fracture of the left lateral fifth rib. Review of the MIP images confirms the above findings. CT ABDOMEN and PELVIS FINDINGS Hepatobiliary: No focal liver abnormality is seen. No gallstones, gallbladder wall thickening, or biliary dilatation. Pancreas: Unremarkable. No pancreatic ductal dilatation or surrounding inflammatory changes. Spleen: Normal. Adrenals/Urinary Tract: Adrenal glands are unremarkable. Kidneys are normal, without renal calculi, focal lesion, or hydronephrosis. Bladder is unremarkable. Stomach/Bowel: Normal stomach and duodenum. No dilated loops of small bowel. Appendix is not visualized. Colonic diverticulosis without evidence of acute diverticulitis. Vascular/Lymphatic: Aortic atherosclerosis. No enlarged abdominal or pelvic lymph nodes. Reproductive: Prostate is unremarkable. Other: Diffuse body wall edema. Musculoskeletal: Unchanged mild anterior compression deformities of the L2 through L5 vertebral bodies. Review of the MIP images confirms the above findings. IMPRESSION: 1. Acute segmental and subsegmental pulmonary emboli in the posterior right lower lobe with associated developing pulmonary infarct in the lateral basal segment. No evidence of acute right heart strain. 2. Acute fracture of the left lateral third rib with associated pulmonary contusion in the left upper  lobe. No pneumothorax. 3. Unchanged comminuted fracture of the left clavicular head and subacute fracture of the left lateral fifth rib. 4. No acute findings in the abdomen or pelvis. 5. Colonic diverticulosis without evidence of acute diverticulitis. Aortic Atherosclerosis (ICD10-I70.0). Electronically Signed   By: Orvan Falconer M.D.   On: 02/03/2023 18:31   CT Head Wo Contrast  Result Date: 02/03/2023 CLINICAL DATA:  Mental status change EXAM: CT HEAD WITHOUT CONTRAST TECHNIQUE: Contiguous axial images were obtained from the base of the skull through the vertex without intravenous contrast. RADIATION DOSE REDUCTION: This exam was performed according to the departmental dose-optimization program which includes automated exposure control, adjustment of the mA and/or kV according to patient size and/or use of iterative reconstruction technique. COMPARISON:  01/06/2023 FINDINGS: Brain: No evidence of acute infarction, hemorrhage, mass, mass effect, or midline shift. No hydrocephalus or extra-axial fluid collection. Vascular: No hyperdense vessel. Skull: Negative for fracture or focal lesion. Sinuses/Orbits: No acute finding. Other: The mastoid air cells are well aerated. IMPRESSION: No acute intracranial process. Electronically Signed   By: Wiliam Ke M.D.   On: 02/03/2023 18:16   DG Chest  Port 1 View  Result Date: 02/03/2023 CLINICAL DATA:  Shortness of breath and chest pain.  Lethargy. EXAM: PORTABLE CHEST 1 VIEW COMPARISON:  12/02/2022 FINDINGS: Cardiomegaly. Tortuous aorta. Pulmonary venous hypertension, possibly with early interstitial edema. No infiltrate or collapse. No acute bone finding. IMPRESSION: Cardiomegaly. Pulmonary venous hypertension, possibly with early interstitial edema. Electronically Signed   By: Paulina Fusi M.D.   On: 02/03/2023 14:35        Scheduled Meds:  ferrous sulfate  325 mg Oral Q breakfast   folic acid  1 mg Oral Daily   guaiFENesin  600 mg Oral BID   heparin   4,000 Units Intravenous Once   midodrine  10 mg Oral TID WC   mometasone-formoterol  2 puff Inhalation BID   multivitamin with minerals  1 tablet Oral Daily   rosuvastatin  10 mg Oral Daily   thiamine  100 mg Oral Daily   Or   thiamine  100 mg Intravenous Daily   Continuous Infusions:  sodium chloride Stopped (02/04/23 0241)   sodium chloride 100 mL/hr at 02/04/23 0128   amiodarone 30 mg/hr (02/04/23 0151)   heparin 1,350 Units/hr (02/04/23 0149)     LOS: 0 days    Time spent: over 30 min    Lacretia Nicks, MD [facility]   To contact the attending provider between 7A-7P or the covering provider during after hours 7P-7A, please log into the web site [email or web address] and access using universal [place] password for that web site. If you do not have the password, please call the hospital operator.  02/04/2023, 9:16 AM
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
## 2023-02-04 NOTE — Assessment & Plan Note (Signed)
Will replace and recheck 

## 2023-02-04 NOTE — Assessment & Plan Note (Signed)
Allow permissive hypertension 

## 2023-02-04 NOTE — Assessment & Plan Note (Signed)
Continue Crestor 10 mg a day.

## 2023-02-04 NOTE — Consult Note (Signed)
Cardiology Consultation   Patient ID: Anthony [name] MRN: [id number]; DOB: 07-Jan-1956  Admit date: 02/03/2023 Date of Consult: 02/04/2023  PCP:  Aviva Kluver   [facility] Providers Cardiologist:  Verne Carrow, MD   {   Patient Profile:   Anthony [name] is a 67 y.o. male with a hx of homelessness, ETOH abuse with prior withdrawals, anxiety/depression, paroxysmal A fib, CVA, HTN, HLD, BPH,  unprovoked PE, non-compliance, chronic pain, midesophagus foreign body, hyponatremia, who is being seen 02/04/2023 for the evaluation of A fib RVR at the request of Dr Lowell Guitar.  History of Present Illness:   Anthony [name] with above PMH presented to ER yesterday c/o SOB, BLE edema, chest pain, abdominal pain per ER and hospitalist note.  He was reportedly noncompliant with home medication.  He continued to drink alcohol excessively. Upon encounter, he is lethargic, talks with eyes closed. He ask if I am here to pour him a drink. He states he is SOB with exertion for 5 years. He had swelling of hands and legs for >5 years. He denied chest pain. He is not aware what's going on right now. He is a poor historian.   Admission diagnostic from today revealing hyponatremia 121, hypochloremia 93 bicarb 12, BUN 6, creatinine 0.82, anion gap 16 elevated alk phos 137, albumin 2.8, AST 45, ALT 23, GFR>60. Pre-albumin<5.  Magnesium 1.3.  Phosphorus 3.3.  Ammonia 32.  CK1 93.  Ferritin 74, 6% saturation ratio.  Hemoglobin 9.6 no significant leukocytosis or thrombocytopenia.  TSH elevated 5.143.  Beta hydroxybutyric acid elevated 1.01.  Lactic acid 3.3.  VBG pH 7.34.  Urinalysis with elevated specific gravity 1.045.  CTA chest revealed acute segmental and subsegmental pulmonary emboli in the posterior right lower lobe with associated developing pulmonary infarct in the lateral basal segment.  No evidence of acute right heart strain.  Acute fracture of left lateral third rib with associated pulmonary contusion in the  left upper lobe, no pneumothorax.  Unchanged comminuted fracture of the left clavicular head and subacute fracture of the left lateral fifth rib.  No acute finding in abdomen or pelvis.  CT head revealed no acute .  Initial EKG revealed A-fib with RVR 115 bpm.  He was admitted to hospital medicine service, placed on heparin infusion for PE and amiodarone drip for A-fib RVR.  Cardiology consult is requested today for further evaluation.  Echocardiogram from today revealed LVEF 40 to 45%, unable to exam diastolic function, global LV hypokinesis with akinesis of the basilar to mid inferior wall, severely reduced RV, moderately enlarged RV, severe LAE and RAE, moderate to severe central MR, aortic sclerosis, IVC dilated.    In brief review of past records, he has been seen by our cardiology service recurrently during inpatient consultation, not established /followed up outpatient due to non-compliance, psychiatric disorders, and homeless circumstance.   He was initially diagnosed with A fib RVR in the setting of ETOH withdrawal and sepsis 2/2 UTI on 07/2020. Echo 07/26/20 with LVEF 55-60%, grade II DD, normal RV, no significant valvular disease. He was placed on metoprolol, amiodarone; he was already on Xarelto for prior PE and this was continued. Given his mental health and social issues and non-compliance behavior, it was felt he is not a good candidate for TEE/DCCV or long term anticoagulation.   He was admitted 09/2020 for acute diastolic heart failure. Coronary CT was done 10/02/20 showed coronary calcium score of 884 (91st percentile), no evidence of FFR significant stenosis. Imdur  15mg  was added to medical therapy for non-obstructive CAD. He failed to follow up outpatient and historically does not take his medications.   Throughout the years he had frequent hospitalizations for sepsis (UTI/pneumonia/ c diff colitis), ETOH withdrawal with DT, A fib RVR, acute diastolic heart failure. In 2024, he has been  hospitalized 6 times so far with above listed reasons. Noted he required left thoracostomy tube placement in 10/06/22 for pneumothorax, in the setting of syncope, ETOH withdrawal/DT, and rib fracture.   He was most recently  discharged on 12/18/22 after a course hospitalization for chronic alcoholic encephalopathy with hallucinations, A fib RVR, ETOH withdrawal. He was discharged with amiodarone, metoprolol and cardizem; Xarelto was continued. He was not complaint with meds. He had incidental finding of punctate acute ischemic stroke on MRI that did not correlate with symptoms. CTA head and neck no LVO. He was seen by neurology and recommended continue statin and Xarelto.       Past Medical History:  Diagnosis Date   Actinic keratosis 11/27/2016   Alcohol abuse with alcohol-induced mood disorder (HCC) 07/18/2018   Alcohol use disorder, severe, dependence (HCC) 09/16/2006   05/22/2015 Argumentative, belligerent and verbally abusive to staff per his note from [place]    Alcoholism [facility])    Anxiety state 04/25/2018   Aortic atherosclerosis (HCC) 08/27/2022   Chronic low back pain 09/16/2006   Followed by NS.  Per his chart from [place], he fell off two storyhouse roof while cleaning his brother's gutters and sustained compression fracture of L3, L4 and L5 and fracture of right transverse process at L3 and nondisplaced fracture of left glenoid rim many years ago. He had multiple imaging in [place] including Lumbar MRI in 2016 which showed moderate to severe spinal canal stenos   Delirium tremens (HCC) 09/01/2017   Depression    Dry eye 11/23/2016   Foreign body in middle portion of esophagus 05/19/2022   Hiatal hernia 09/14/2016   Status Collis gastroplasty and Nissen's fundoplication on 04/29/2015 in [place]   History of delirium tremons 07/22/2020   DTs during 10/2016 08/2017. And 12/2017 admissions    History of pulmonary embolus (PE) 11/02/2016   Unprovoked 11/01/2016.  Xarelto from 11/01/16 to 09/08/2017.   Hydrocele    Surgically corrected   Hypertension    Hypoalbuminemia due to protein-calorie malnutrition (HCC) 07/22/2020   Lumbar compression fracture (HCC)    Multiple rib fractures 07/22/2019   Left rib details XR: left ribs demonstrate multiple remote rib   Pancytopenia (HCC)    Rhabdomyolysis 07/22/2020   Skin cancer    exciced 2017   Tinea versicolor 06/08/2013   Tobacco use disorder 07/22/2020   Tooth infection 04/25/2018   Trigger finger, acquired 11/18/2012   Ulnar tunnel syndrome of right wrist 11/08/2012    Past Surgical History:  Procedure Laterality Date   BIOPSY  05/21/2022   Procedure: BIOPSY;  Surgeon: Lynann Bologna, MD;  Location: [facility] ENDOSCOPY;  Service: Gastroenterology;;   CATARACT EXTRACTION     ESOPHAGOGASTRODUODENOSCOPY (EGD) WITH PROPOFOL N/A 05/21/2022   Procedure: ESOPHAGOGASTRODUODENOSCOPY (EGD) WITH PROPOFOL;  Surgeon: Lynann Bologna, MD;  Location: [facility] ENDOSCOPY;  Service: Gastroenterology;  Laterality: N/A;   FOREIGN BODY REMOVAL N/A 05/21/2022   Procedure: FOREIGN BODY REMOVAL;  Surgeon: Lynann Bologna, MD;  Location: [facility] ENDOSCOPY;  Service: Gastroenterology;  Laterality: N/A;   HIATAL HERNIA REPAIR  2016   HYDROCELE EXCISION / REPAIR  2010   SKIN CANCER EXCISION Left    Excised 2017  Home Medications:  Prior to Admission medications   Medication Sig Start Date End Date Taking? Authorizing Provider  acetaminophen (TYLENOL) 500 MG tablet Take 1 tablet (500 mg total) by mouth every 8 (eight) hours as needed. Patient taking differently: Take 1,000 mg by mouth every 6 (six) hours as needed (for pain). 10/13/22  Yes Leatha Gilding, MD  amiodarone (PACERONE) 200 MG tablet Take 1 tablet (200 mg total) by mouth daily. 12/27/22   Regalado, Belkys A, MD  amiodarone (PACERONE) 200 MG tablet Take 2 tablets (400 mg total) by mouth 2 (two) times daily for 10 days. Patient not taking: Reported on 02/03/2023 12/18/22 12/28/22   Regalado, Jon Billings A, MD  diltiazem (CARDIZEM CD) 120 MG 24 hr capsule Take 1 capsule (120 mg total) by mouth daily. Patient not taking: Reported on 02/03/2023 12/19/22   Hartley Barefoot A, MD  ferrous sulfate 325 (65 FE) MG tablet Take 1 tablet (325 mg total) by mouth daily with breakfast. 12/18/22 01/17/23  Regalado, Jon Billings A, MD  folic acid (FOLVITE) 1 MG tablet Take 1 tablet (1 mg total) by mouth daily. Patient not taking: Reported on 02/03/2023 12/18/22   Regalado, Jon Billings A, MD  loratadine (CLARITIN) 10 MG tablet Take 1 tablet (10 mg total) by mouth daily. Patient not taking: Reported on 02/03/2023 12/19/22   Regalado, Jon Billings A, MD  magnesium oxide (MAG-OX) 400 (240 Mg) MG tablet Take 1 tablet (400 mg total) by mouth 2 (two) times daily. Patient not taking: Reported on 02/03/2023 12/18/22   Regalado, Jon Billings A, MD  methocarbamol (ROBAXIN) 500 MG tablet Take 1 tablet (500 mg total) by mouth every 6 (six) hours as needed for muscle spasms. Patient not taking: Reported on 02/03/2023 12/18/22   Regalado, Jon Billings A, MD  metoprolol succinate (TOPROL-XL) 25 MG 24 hr tablet Take 1 tablet (25 mg total) by mouth 2 (two) times daily. Patient not taking: Reported on 02/03/2023 12/18/22 03/18/23  Regalado, Jon Billings A, MD  midodrine (PROAMATINE) 10 MG tablet Take 1 tablet (10 mg total) by mouth 3 (three) times daily with meals. Patient not taking: Reported on 02/03/2023 12/18/22   Regalado, Jon Billings A, MD  mirtazapine (REMERON) 15 MG tablet Take 1 tablet (15 mg total) by mouth at bedtime. Patient not taking: Reported on 02/03/2023 12/18/22   Regalado, Jon Billings A, MD  mometasone-formoterol (DULERA) 200-5 MCG/ACT AERO Inhale 2 puffs into the lungs 2 (two) times daily. Patient not taking: Reported on 02/03/2023 12/18/22   Regalado, Jon Billings A, MD  Multiple Vitamins-Minerals (CENTRUM SILVER 50+MEN) TABS Take 1 tablet by mouth every other day. Patient not taking: Reported on 02/03/2023 12/18/22   Regalado, Jon Billings A, MD  Nutritional Supplements  (ENSURE HIGH PROTEIN) LIQD Take 237 mLs by mouth daily. Patient not taking: Reported on 02/03/2023    [provider]  rivaroxaban (XARELTO) 20 MG TABS tablet Take 1 tablet (20 mg total) by mouth daily with supper. Patient not taking: Reported on 02/03/2023 12/18/22   Regalado, Jon Billings A, MD  rosuvastatin (CRESTOR) 10 MG tablet Take 1 tablet (10 mg total) by mouth daily. Patient not taking: Reported on 02/03/2023 12/18/22   Alba Cory, MD    Inpatient Medications: Scheduled Meds:  acetaminophen  1,000 mg Oral Q8H   chlordiazePOXIDE  25 mg Oral QID   Followed by   Melene Muller ON 02/05/2023] chlordiazePOXIDE  25 mg Oral TID   Followed by   Melene Muller ON 02/06/2023] chlordiazePOXIDE  25 mg Oral BH-qamhs   Followed by   Melene Muller ON  02/07/2023] chlordiazePOXIDE  25 mg Oral Daily   ferrous sulfate  325 mg Oral Q breakfast   folic acid  1 mg Oral Daily   furosemide  40 mg Intravenous BID   guaiFENesin  600 mg Oral BID   lidocaine  3 patch Transdermal Q24H   midodrine  10 mg Oral TID WC   mometasone-formoterol  2 puff Inhalation BID   multivitamin with minerals  1 tablet Oral Daily   polyethylene glycol  17 g Oral BID   rosuvastatin  10 mg Oral Daily   [START ON 02/12/2023] thiamine  100 mg Oral Daily   Continuous Infusions:  sodium chloride 100 mL/hr at 02/04/23 1129   amiodarone 30 mg/hr (02/04/23 1255)   heparin 1,550 Units/hr (02/04/23 1254)   thiamine (VITAMIN B1) injection Stopped (02/04/23 1317)   Followed by   Melene Muller ON 02/07/2023] thiamine (VITAMIN B1) injection     PRN Meds: acetaminophen **FOLLOWED BY** [START ON 02/07/2023] acetaminophen, albuterol, LORazepam **OR** LORazepam, ondansetron **OR** ondansetron (ZOFRAN) IV, oxyCODONE **OR** oxyCODONE  Allergies:    Allergies  Allergen Reactions   Other Itching and Other (See Comments)    Seasonal allergies- Itchy eyes, runny nose, congestion   Poison Ivy Extract Rash    Social History:   Social History   Socioeconomic  History   Marital status: Single    Spouse name: Not on file   Number of children: Not on file   Years of education: Not on file   Highest education level: Not on file  Occupational History   Occupation: unemployed  Tobacco Use   Smoking status: Never   Smokeless tobacco: Current    Types: Snuff   Tobacco comments:    Pt states do snuff when stressed had 1 can last 3 month//PL  Vaping Use   Vaping status: Never Used  Substance and Sexual Activity   Alcohol use: Yes    Alcohol/week: 43.0 standard drinks of alcohol    Types: 42 Cans of beer, 1 Standard drinks or equivalent per week   Drug use: Yes    Types: Marijuana   Sexual activity: Yes    Birth control/protection: None  Other Topics Concern   Not on file  Social History Narrative   Lives with a friend. Has children but hasn't met them in a while. He doesn't know where they live.    Social Determinants of Health   Financial Resource Strain: Not on file  Food Insecurity: Food Insecurity Present (12/11/2022)   Hunger Vital Sign    Worried About Running Out of Food in the Last Year: Often true    Ran Out of Food in the Last Year: Often true  Transportation Needs: Unmet Transportation Needs (12/11/2022)   PRAPARE - Administrator, Civil Service (Medical): Yes    Lack of Transportation (Non-Medical): Yes  Physical Activity: Not on file  Stress: Not on file  Social Connections: Not on file  Intimate Partner Violence: Not At Risk (12/02/2022)   Humiliation, Afraid, Rape, and Kick questionnaire    Fear of Current or Ex-Partner: No    Emotionally Abused: No    Physically Abused: No    Sexually Abused: No    Family History:    Family History  Problem Relation Age of Onset   Heart attack Father        died at 2 years   Dementia Father    Stroke Father    Cancer Sister    Colon cancer Neg Hx  Colon polyps Neg Hx    Esophageal cancer Neg Hx    Stomach cancer Neg Hx    Rectal cancer Neg Hx      ROS:   Poor historian, unable ROS   Physical Exam/Data:   Vitals:   02/04/23 1345 02/04/23 1400 02/04/23 1415 02/04/23 1421  BP: 102/80 109/88 (!) 109/90   Pulse: 91 (!) 25 90   Resp: (!) 22 (!) 22 (!) 23   Temp:    98 F (36.7 C)  TempSrc:    Oral  SpO2: 100% (!) 84% 100%   Weight:      Height:        Intake/Output Summary (Last 24 hours) at 02/04/2023 1429 Last data filed at 02/04/2023 1317 Gross per 24 hour  Intake 956.67 ml  Output 400 ml  Net 556.67 ml      02/03/2023    7:00 PM 01/06/2023    3:41 AM 12/15/2022    5:45 AM  Last 3 Weights  Weight (lbs) 150 lb 140 lb 148 lb 9.4 oz  Weight (kg) 68.04 kg 63.504 kg 67.4 kg     Body mass index is 22.15 kg/m.   Vitals:  Vitals:   02/04/23 1415 02/04/23 1421  BP: (!) 109/90   Pulse: 90   Resp: (!) 23   Temp:  98 F (36.7 C)  SpO2: 100%    General Appearance: In no apparent distress, laying in bed HEENT: Normocephalic, atraumatic. Eyes remain closed while talking  Cardiovascular: Irregularly irregular, normal S1-S2,  systolic murmur grade II apex  Respiratory: Resting breathing unlabored, lungs sounds with crackles bilaterally, no use of accessory muscles. On room air.    Gastrointestinal: Bowel sounds positive, abdomen soft Extremities: Able to move all extremities in bed without difficulty, 2+ pitting edema of BLE Musculoskeletal: Normal muscle bulk and tone Skin: Intact, warm, dry. No rashes or petechiae noted in exposed areas.  Neurologic:Lethargic, poor insight, moving all extremity spontaneously Psychiatric: calm  External device: Foley in place    EKG:  The EKG was personally reviewed and demonstrates:    EKG from 02/03/2023 at 1306 revealed A-fib with RVR 115 bpm, RAD, no acute change comparing to previous EKG  EKG from 7/710/24 at 2341 revealed A-fib with RVR 107 bpm, RAD, no acute change comparing to previous EKG  Telemetry:  Telemetry was personally reviewed and demonstrates:    A fib 80s    Relevant  CV Studies:  Echocardiogram 02/04/2023:   1. Left ventricular ejection fraction, by estimation, is 40 to 45%. The  left ventricle has mildly decreased function. The left ventricle  demonstrates regional wall motion abnormalities (see scoring  diagram/findings for description). Left ventricular  diastolic function could not be evaluated. There is global LV hypokinesis  with akinesis of the basilar to mid inferior wall.   2. Right ventricular systolic function is severely reduced. The right  ventricular size is moderately enlarged. Tricuspid regurgitation signal is  inadequate for assessing PA pressure.   3. Left atrial size was severely dilated.   4. Right atrial size was severely dilated.   5. The mitral valve is thickened and the posterior leaflet is restricted  there is moderate to severe central MR. The mitral valve is abnormal.  Moderate to severe mitral valve regurgitation. No evidence of mitral  stenosis.   6. The aortic valve is tricuspid. There is mild calcification of the  aortic valve. Aortic valve regurgitation is not visualized. Aortic valve  sclerosis/calcification is present, without any  evidence of aortic  stenosis.   7. The inferior vena cava is dilated in size with <50% respiratory  variability, suggesting right atrial pressure of 15 mmHg.   Echocardiogram from 08/28/2022:   1. Left ventricular ejection fraction, by estimation, is 60 to 65%. The  left ventricle has normal function. The left ventricle has no regional  wall motion abnormalities. Left ventricular diastolic parameters are  indeterminate.   2. Right ventricular systolic function is normal. The right ventricular  size is mildly enlarged. Tricuspid regurgitation signal is inadequate for  assessing PA pressure.   3. The mitral valve is normal in structure. No evidence of mitral valve  regurgitation. No evidence of mitral stenosis.   4. The aortic valve was not well visualized. Aortic valve regurgitation   is not visualized. No aortic stenosis is present.   Laboratory Data:  High Sensitivity Troponin:   Recent Labs  Lab 01/06/23 0356 01/06/23 0534 02/03/23 1313 02/03/23 1508  TROPONINIHS 21* 21* 11 8     Chemistry Recent Labs  Lab 02/03/23 1313 02/03/23 1405 02/04/23 0051 02/04/23 0226 02/04/23 0300 02/04/23 1259  NA 123*   < > 121* 124* 122* 121*  K 3.7   < > 4.7 5.4* 4.5 4.1  CL 96*   < > 93*  --  95* 97*  CO2 14*   < > 12*  --  14* 15*  GLUCOSE 86   < > 133*  --  135* 123*  BUN 8   < > 6*  --  7* 8  CREATININE 1.01   < > 0.82  --  0.79 0.81  CALCIUM 8.0*   < > 7.5*  --  7.5* 7.0*  MG 1.2*  --  1.3*  1.3*  --   --   --   GFRNONAA >60   < > >60  --  >60 >60  ANIONGAP 13   < > 16*  --  13 9   < > = values in this interval not displayed.    Recent Labs  Lab 02/03/23 1313 02/04/23 0051  PROT 7.1 6.4*  6.4*  ALBUMIN 3.3* 2.9*  2.8*  AST 50* 49*  45*  ALT 23 23  23   ALKPHOS 138* 140*  137*  BILITOT 1.4* 1.2  0.9   Lipids No results for input(s): "CHOL", "TRIG", "HDL", "LABVLDL", "LDLCALC", "CHOLHDL" in the last 168 hours.  Hematology Recent Labs  Lab 02/03/23 1313 02/03/23 1405 02/04/23 0051 02/04/23 0226  WBC 4.6  --  5.0  --   RBC 2.83*  --  2.83*  2.78*  --   HGB 9.6* 11.6* 9.6* 10.9*  HCT 28.6* 34.0* 28.8* 32.0*  MCV 101.1*  --  101.8*  --   MCH 33.9  --  33.9  --   MCHC 33.6  --  33.3  --   RDW 21.3*  --  21.5*  --   PLT 216  --  236  --    Thyroid  Recent Labs  Lab 02/04/23 0051  TSH 5.143*    BNP Recent Labs  Lab 02/03/23 1314 02/04/23 0051  BNP 817.0* 1,077.8*    DDimer No results for input(s): "DDIMER" in the last 168 hours.   Radiology/Studies:  VAS [place] LOWER EXTREMITY VENOUS (DVT)  Result Date: 02/04/2023  Lower Venous DVT Study Patient Name:  Teancum A Hair  Date of Exam:   02/04/2023 Medical Rec #: [id number]     Accession #:    [id number] Date of  Birth: 01/16/1956     Patient Gender: M Patient Age:   48 years Exam  Location:  [facility] Procedure:      VAS [place] LOWER EXTREMITY VENOUS (DVT) Referring Phys: Jonny Ruiz DOUTOVA --------------------------------------------------------------------------------  Indications: Pulmonary embolism, and Swelling.  Comparison Study: Prior negative bilateral LEV done 11/04/16, prior negative left                   LEV done 05/19/22 Performing Technologist: Sherren Kerns RVS  Examination Guidelines: A complete evaluation includes B-mode imaging, spectral Doppler, color Doppler, and power Doppler as needed of all accessible portions of each vessel. Bilateral testing is considered an integral part of a complete examination. Limited examinations for reoccurring indications may be performed as noted. The reflux portion of the exam is performed with the patient in reverse Trendelenburg.  +--------+---------------+---------+-----------+----------------+-------------+ RIGHT   CompressibilityPhasicitySpontaneityProperties      Thrombus                                                                 Aging         +--------+---------------+---------+-----------+----------------+-------------+ CFV     Full                               pulsatile                                                                waveforms                     +--------+---------------+---------+-----------+----------------+-------------+ SFJ     Full                                                             +--------+---------------+---------+-----------+----------------+-------------+ FV Prox Full                                                             +--------+---------------+---------+-----------+----------------+-------------+ FV Mid  Full                                                             +--------+---------------+---------+-----------+----------------+-------------+ FV      Full  Distal                                                                   +--------+---------------+---------+-----------+----------------+-------------+ PFV     Full                                                             +--------+---------------+---------+-----------+----------------+-------------+ POP     Full                               pulsatile                                                                waveforms                     +--------+---------------+---------+-----------+----------------+-------------+ PTV     Full                                                             +--------+---------------+---------+-----------+----------------+-------------+ PERO    Full                                                             +--------+---------------+---------+-----------+----------------+-------------+   +---------+---------------+---------+-----------+---------------+-------------+ LEFT     CompressibilityPhasicitySpontaneityProperties     Thrombus                                                                 Aging         +---------+---------------+---------+-----------+---------------+-------------+ CFV      Full                               pulsatile                                                                waveforms                    +---------+---------------+---------+-----------+---------------+-------------+  SFJ      Full                                                            +---------+---------------+---------+-----------+---------------+-------------+ FV Prox  Full                                                            +---------+---------------+---------+-----------+---------------+-------------+ FV Mid   Full                                                            +---------+---------------+---------+-----------+---------------+-------------+ FV DistalFull                                                             +---------+---------------+---------+-----------+---------------+-------------+ PFV      Full                                                            +---------+---------------+---------+-----------+---------------+-------------+ POP      Full                               pulsatile                                                                waveforms                    +---------+---------------+---------+-----------+---------------+-------------+ PTV      Full                                                            +---------+---------------+---------+-----------+---------------+-------------+ PERO     Full                                                            +---------+---------------+---------+-----------+---------------+-------------+ Soleal   Full                                                            +---------+---------------+---------+-----------+---------------+-------------+  Summary: BILATERAL: - No evidence of deep vein thrombosis seen in the lower extremities, bilaterally. -No evidence of popliteal cyst, bilaterally.   *See table(s) above for measurements and observations. Electronically signed by Heath Lark on 02/04/2023 at 2:15:34 PM.    Final    ECHOCARDIOGRAM COMPLETE  Result Date: 02/04/2023    ECHOCARDIOGRAM REPORT   Patient Name:   Carlo A Bernat Date of Exam: 02/04/2023 Medical Rec #:  [id number]    Height:       69.0 in Accession #:    [id number]   Weight:       150.0 lb Date of Birth:  1955-09-15    BSA:          1.828 m Patient Age:    67 years     BP:           115/85 mmHg Patient Gender: M            HR:           105 bpm. Exam Location:  Inpatient Procedure: 2D Echo, Cardiac Doppler and Color Doppler Indications:    pulmonary embolus  History:        Patient has prior history of Echocardiogram examinations, most                 recent 08/28/2022. Arrythmias:Atrial  Fibrillation; Risk                 Factors:Hypertension, Dyslipidemia and Alcohol abuse.  Sonographer:    Delcie Roch RDCS Referring Phys: [id number] ANASTASSIA DOUTOVA IMPRESSIONS  1. Left ventricular ejection fraction, by estimation, is 40 to 45%. The left ventricle has mildly decreased function. The left ventricle demonstrates regional wall motion abnormalities (see scoring diagram/findings for description). Left ventricular diastolic function could not be evaluated. There is global LV hypokinesis with akinesis of the basilar to mid inferior wall.  2. Right ventricular systolic function is severely reduced. The right ventricular size is moderately enlarged. Tricuspid regurgitation signal is inadequate for assessing PA pressure.  3. Left atrial size was severely dilated.  4. Right atrial size was severely dilated.  5. The mitral valve is thickened and the posterior leaflet is restricted there is moderate to severe central MR. The mitral valve is abnormal. Moderate to severe mitral valve regurgitation. No evidence of mitral stenosis.  6. The aortic valve is tricuspid. There is mild calcification of the aortic valve. Aortic valve regurgitation is not visualized. Aortic valve sclerosis/calcification is present, without any evidence of aortic stenosis.  7. The inferior vena cava is dilated in size with <50% respiratory variability, suggesting right atrial pressure of 15 mmHg. FINDINGS  Left Ventricle: Left ventricular ejection fraction, by estimation, is 40 to 45%. The left ventricle has mildly decreased function. The left ventricle demonstrates regional wall motion abnormalities. The left ventricular internal cavity size was normal in size. There is no left ventricular hypertrophy. Left ventricular diastolic function could not be evaluated due to atrial fibrillation. Left ventricular diastolic function could not be evaluated.  LV Wall Scoring: The inferior wall is akinetic. Right Ventricle: The right ventricular size  is moderately enlarged. No increase in right ventricular wall thickness. Right ventricular systolic function is severely reduced. Tricuspid regurgitation signal is inadequate for assessing PA pressure. The tricuspid regurgitant velocity is 2.13 m/s, and with an assumed right atrial pressure of 8 mmHg, the estimated right ventricular systolic pressure is 26.1 mmHg. Left Atrium: Left atrial size was severely dilated. Right Atrium: Right atrial size was severely  dilated. Pericardium: There is no evidence of pericardial effusion. Mitral Valve: The mitral valve is thickened and the posterior leaflet is restricted there is moderate to severe central MR. The mitral valve is abnormal. There is moderate thickening of the mitral valve leaflet(s). There is moderate calcification of the mitral valve leaflet(s). Moderate to severe mitral valve regurgitation. No evidence of mitral valve stenosis. Tricuspid Valve: The tricuspid valve is normal in structure. Tricuspid valve regurgitation is not demonstrated. No evidence of tricuspid stenosis. Aortic Valve: The aortic valve is tricuspid. There is mild calcification of the aortic valve. Aortic valve regurgitation is not visualized. Aortic valve sclerosis/calcification is present, without any evidence of aortic stenosis. Pulmonic Valve: The pulmonic valve was normal in structure. Pulmonic valve regurgitation is not visualized. No evidence of pulmonic stenosis. Aorta: The aortic root is normal in size and structure. Venous: The inferior vena cava is dilated in size with less than 50% respiratory variability, suggesting right atrial pressure of 15 mmHg. IAS/Shunts: No atrial level shunt detected by color flow Doppler.  LEFT VENTRICLE PLAX 2D LVIDd:         4.10 cm LVIDs:         3.80 cm LV PW:         0.80 cm LV IVS:        0.70 cm LVOT diam:     2.10 cm LVOT Area:     3.46 cm  RIGHT VENTRICLE          IVC RV Basal diam:  3.40 cm  IVC diam: 2.30 cm TAPSE (M-mode): 1.4 cm LEFT ATRIUM               Index        RIGHT ATRIUM           Index LA diam:        4.40 cm  2.41 cm/m   RA Area:     20.40 cm LA Vol (A2C):   100.0 ml 54.70 ml/m  RA Volume:   58.50 ml  32.00 ml/m LA Vol (A4C):   76.6 ml  41.90 ml/m LA Biplane Vol: 88.6 ml  48.46 ml/m   AORTA Ao Root diam: 3.30 cm Ao Asc diam:  3.30 cm TRICUSPID VALVE TR Peak grad:   18.1 mmHg TR Vmax:        213.00 cm/s  SHUNTS Systemic Diam: 2.10 cm Arvilla Meres MD Electronically signed by Arvilla Meres MD Signature Date/Time: 02/04/2023/10:27:44 AM    Final    CT Angio Chest PE W and/or Wo Contrast  Result Date: 02/03/2023 CLINICAL DATA:  Pulmonary embolism (PE) suspected, high prob; Abdominal pain, acute, nonlocalized. EXAM: CT ANGIOGRAPHY CHEST CT ABDOMEN AND PELVIS WITH CONTRAST TECHNIQUE: Multidetector CT imaging of the chest was performed using the standard protocol during bolus administration of intravenous contrast. Multiplanar CT image reconstructions and MIPs were obtained to evaluate the vascular anatomy. Multidetector CT imaging of the abdomen and pelvis was performed using the standard protocol during bolus administration of intravenous contrast. RADIATION DOSE REDUCTION: This exam was performed according to the departmental dose-optimization program which includes automated exposure control, adjustment of the mA and/or kV according to patient size and/or use of iterative reconstruction technique. CONTRAST:  75mL OMNIPAQUE IOHEXOL 350 MG/ML SOLN COMPARISON:  Chest CT 01/06/2023.  CT abdomen/pelvis 05/20/2022. FINDINGS: CTA CHEST FINDINGS Cardiovascular: Acute segmental and subsegmental pulmonary emboli in the posterior right lower lobe (for example, coronal image 46 series 8) with associated developing pulmonary infarct in the lateral  basal segment (axial image 219 series 6). No evidence of acute right heart strain. Extensive coronary artery calcifications. Mediastinum/Nodes: Postoperative changes of prior hiatal hernia repair. No  thoracic lymphadenopathy. Lungs/Pleura: Subpleural ground-glass opacity in the left upper lobe subjacent to an acute fracture of the left lateral third rib (axial image 39 series 7), consistent with pulmonary contusion. No pneumothorax. Mucous plugging and developing atelectasis in the medial basal segment of the right lower lobe. Musculoskeletal: Unchanged comminuted fracture of the left clavicular head and subacute fracture of the left lateral fifth rib. Review of the MIP images confirms the above findings. CT ABDOMEN and PELVIS FINDINGS Hepatobiliary: No focal liver abnormality is seen. No gallstones, gallbladder wall thickening, or biliary dilatation. Pancreas: Unremarkable. No pancreatic ductal dilatation or surrounding inflammatory changes. Spleen: Normal. Adrenals/Urinary Tract: Adrenal glands are unremarkable. Kidneys are normal, without renal calculi, focal lesion, or hydronephrosis. Bladder is unremarkable. Stomach/Bowel: Normal stomach and duodenum. No dilated loops of small bowel. Appendix is not visualized. Colonic diverticulosis without evidence of acute diverticulitis. Vascular/Lymphatic: Aortic atherosclerosis. No enlarged abdominal or pelvic lymph nodes. Reproductive: Prostate is unremarkable. Other: Diffuse body wall edema. Musculoskeletal: Unchanged mild anterior compression deformities of the L2 through L5 vertebral bodies. Review of the MIP images confirms the above findings. IMPRESSION: 1. Acute segmental and subsegmental pulmonary emboli in the posterior right lower lobe with associated developing pulmonary infarct in the lateral basal segment. No evidence of acute right heart strain. 2. Acute fracture of the left lateral third rib with associated pulmonary contusion in the left upper lobe. No pneumothorax. 3. Unchanged comminuted fracture of the left clavicular head and subacute fracture of the left lateral fifth rib. 4. No acute findings in the abdomen or pelvis. 5. Colonic diverticulosis  without evidence of acute diverticulitis. Aortic Atherosclerosis (ICD10-I70.0). Electronically Signed   By: Orvan Falconer M.D.   On: 02/03/2023 18:31   CT ABDOMEN PELVIS W CONTRAST  Result Date: 02/03/2023 CLINICAL DATA:  Pulmonary embolism (PE) suspected, high prob; Abdominal pain, acute, nonlocalized. EXAM: CT ANGIOGRAPHY CHEST CT ABDOMEN AND PELVIS WITH CONTRAST TECHNIQUE: Multidetector CT imaging of the chest was performed using the standard protocol during bolus administration of intravenous contrast. Multiplanar CT image reconstructions and MIPs were obtained to evaluate the vascular anatomy. Multidetector CT imaging of the abdomen and pelvis was performed using the standard protocol during bolus administration of intravenous contrast. RADIATION DOSE REDUCTION: This exam was performed according to the departmental dose-optimization program which includes automated exposure control, adjustment of the mA and/or kV according to patient size and/or use of iterative reconstruction technique. CONTRAST:  75mL OMNIPAQUE IOHEXOL 350 MG/ML SOLN COMPARISON:  Chest CT 01/06/2023.  CT abdomen/pelvis 05/20/2022. FINDINGS: CTA CHEST FINDINGS Cardiovascular: Acute segmental and subsegmental pulmonary emboli in the posterior right lower lobe (for example, coronal image 46 series 8) with associated developing pulmonary infarct in the lateral basal segment (axial image 219 series 6). No evidence of acute right heart strain. Extensive coronary artery calcifications. Mediastinum/Nodes: Postoperative changes of prior hiatal hernia repair. No thoracic lymphadenopathy. Lungs/Pleura: Subpleural ground-glass opacity in the left upper lobe subjacent to an acute fracture of the left lateral third rib (axial image 39 series 7), consistent with pulmonary contusion. No pneumothorax. Mucous plugging and developing atelectasis in the medial basal segment of the right lower lobe. Musculoskeletal: Unchanged comminuted fracture of the left  clavicular head and subacute fracture of the left lateral fifth rib. Review of the MIP images confirms the above findings. CT ABDOMEN and PELVIS FINDINGS Hepatobiliary: No  focal liver abnormality is seen. No gallstones, gallbladder wall thickening, or biliary dilatation. Pancreas: Unremarkable. No pancreatic ductal dilatation or surrounding inflammatory changes. Spleen: Normal. Adrenals/Urinary Tract: Adrenal glands are unremarkable. Kidneys are normal, without renal calculi, focal lesion, or hydronephrosis. Bladder is unremarkable. Stomach/Bowel: Normal stomach and duodenum. No dilated loops of small bowel. Appendix is not visualized. Colonic diverticulosis without evidence of acute diverticulitis. Vascular/Lymphatic: Aortic atherosclerosis. No enlarged abdominal or pelvic lymph nodes. Reproductive: Prostate is unremarkable. Other: Diffuse body wall edema. Musculoskeletal: Unchanged mild anterior compression deformities of the L2 through L5 vertebral bodies. Review of the MIP images confirms the above findings. IMPRESSION: 1. Acute segmental and subsegmental pulmonary emboli in the posterior right lower lobe with associated developing pulmonary infarct in the lateral basal segment. No evidence of acute right heart strain. 2. Acute fracture of the left lateral third rib with associated pulmonary contusion in the left upper lobe. No pneumothorax. 3. Unchanged comminuted fracture of the left clavicular head and subacute fracture of the left lateral fifth rib. 4. No acute findings in the abdomen or pelvis. 5. Colonic diverticulosis without evidence of acute diverticulitis. Aortic Atherosclerosis (ICD10-I70.0). Electronically Signed   By: Orvan Falconer M.D.   On: 02/03/2023 18:31   CT Head Wo Contrast  Result Date: 02/03/2023 CLINICAL DATA:  Mental status change EXAM: CT HEAD WITHOUT CONTRAST TECHNIQUE: Contiguous axial images were obtained from the base of the skull through the vertex without intravenous  contrast. RADIATION DOSE REDUCTION: This exam was performed according to the departmental dose-optimization program which includes automated exposure control, adjustment of the mA and/or kV according to patient size and/or use of iterative reconstruction technique. COMPARISON:  01/06/2023 FINDINGS: Brain: No evidence of acute infarction, hemorrhage, mass, mass effect, or midline shift. No hydrocephalus or extra-axial fluid collection. Vascular: No hyperdense vessel. Skull: Negative for fracture or focal lesion. Sinuses/Orbits: No acute finding. Other: The mastoid air cells are well aerated. IMPRESSION: No acute intracranial process. Electronically Signed   By: Wiliam Ke M.D.   On: 02/03/2023 18:16   DG Chest Port 1 View  Result Date: 02/03/2023 CLINICAL DATA:  Shortness of breath and chest pain.  Lethargy. EXAM: PORTABLE CHEST 1 VIEW COMPARISON:  12/02/2022 FINDINGS: Cardiomegaly. Tortuous aorta. Pulmonary venous hypertension, possibly with early interstitial edema. No infiltrate or collapse. No acute bone finding. IMPRESSION: Cardiomegaly. Pulmonary venous hypertension, possibly with early interstitial edema. Electronically Signed   By: Paulina Fusi M.D.   On: 02/03/2023 14:35     Assessment and Plan:   Paroxysmal A-fib with RVR -Patient had recurrent hospitalization since 07/2020 for A-fib RVR, most frequently in the setting of sepsis, alcohol withdrawal, not candidate for TEE/DCCV due to chronic non-compliance, mental health issue, ETOH abuse,  as well as homelessness  -He is now presenting with acute PE and rib fracture and decompensated CHF, continue drinking ETOH  -Echo showed newly discovered LV systolic dysfunction, LVEF down to 40-45%, global LV hypokinesis with akinesis of the basilar to mid inferior wall.  Severely reduced RV with moderate RV enlargement, severe RAE and LAE, moderate to severe central MR, aortic sclerosis, IVC dilated -Recommend reload with IV amiodarone for rate/rhythm  control, transition to PO before discharge reinforced the importance of medication compliance, stop diltiazem given reduced LVEF, may add back metoprolol if BP allows  -Noncompliant with his PTA Xarelto, should be continued, on IV heparin gtt now   Acute biventricular heart failure   -Suspect etiology of cardiomyopathy is due to tachycardia mediated + right heart  strain from PE +/- chronic heavy ETOH abuse -He is not a good candidate for invasive ischemic evaluation given noncompliance/psychiatric disorder/homelessness, doubt a good candidate for PERT intervention  -clinically gross hypervolemic, will start IV Lasix 40mg  BID, please monitor I&O, daily weight  -GDMT: Do not plan to add additional meds at this time unless patient is able to demonstrate compliance, start Toprol XL 50 mg daily when BP stable (however appears he never took his meds after each discharge)  Acute segmental and subsegmental pulmonary emboli in posterior RLL with pulmonary infarct  Chronic alcohol abuse with withdrawal Acute rib fracture with associated pulmonary contusion Acute hyponatremia Non-anion gap metabolic acidosis ETOH withdrawal  - per primary team     Risk Assessment/Risk Scores:   New York Heart Association (NYHA) Functional Class NYHA Class III  CHA2DS2-VASc Score = 6   This indicates a 9.7% annual risk of stroke. The patient's score is based upon: CHF History: 1 HTN History: 1 Diabetes History: 0 Stroke History: 2 Vascular Disease History: 1 Age Score: 1 Gender Score: 0     For questions or updates, please contact [facility] Please consult [email or web address] for contact info under    Signed, Cyndi Bender, NP  02/04/2023 2:29 PM

## 2023-02-04 NOTE — Progress Notes (Signed)
PT Cancellation Note  Patient Details Name: Anthony [name] MRN: [id number] DOB: 1955-08-17   Cancelled Treatment:    Reason Eval/Treat Not Completed: Patient not medically ready (Pt found to have new acute subsegmental PE in Rt Lower lobe. Heparin initiated 02/03/23 at 19:56. Pt heparin level remains subtherapeutic. Will follow up at later date/time when pt in therapeutic range and has been on heparin for 24 hours.)   Wynn Maudlin, DPT Acute Rehabilitation Services Office [phone]  02/04/23 9:07 AM
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
## 2023-02-04 NOTE — Progress Notes (Signed)
  Echocardiogram 2D Echocardiogram has been performed.  Delcie Roch 02/04/2023, 9:48 AM

## 2023-02-04 NOTE — Assessment & Plan Note (Addendum)
Admit to progressive Initiate heparin drip  Would likely benefit from case manager consult for long term anticoagulation Hold home blood pressure medications avoid hypotension Cycle cardiac enzymes Order echogram and lower extremity Dopplers  Most likely risk factors for hypercoagulable state being sedentary lifestyle  unknown  Would benefit from hypercoagulable workup as an outpatient Initially had some soft blood pressures which has improved with IV fluids And felt to be more likely secondary to dehydration and chronically soft blood pressures rather than PE CTA showing no evidence of right heart strain but will order echogram to confirm currently blood pressure stable patient is asymptomatic Discussed with PCCM currently BP is improving cont to monitor no indication for thrombolytics Given complex case pls consult Pulmonology in AM

## 2023-02-04 NOTE — Progress Notes (Signed)
VASCULAR LAB    Bilateral lower extremity venous duplex has been performed.  See CV proc for preliminary results.   Yogi Arther, RVT 02/04/2023, 9:58 AM

## 2023-02-04 NOTE — Assessment & Plan Note (Signed)
Patient has been rehydrated continue IV fluids
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
## 2023-02-04 NOTE — Progress Notes (Signed)
OT Cancellation Note  Patient Details Name: EPIC TRIBBETT MRN: [id number] DOB: [birth year]   Cancelled Treatment:    Reason Eval/Treat Not Completed: Patient not medically ready (new acute subsegmental PE. Heparin initiated 02/03/23 at 19:56. Pt heparin level remains subtherapeutic.)  Donia Pounds 02/04/2023, 10:37 AM

## 2023-02-04 NOTE — Assessment & Plan Note (Signed)
Supportive management incentive spirometry

## 2023-02-04 NOTE — Plan of Care (Signed)

## 2023-02-04 NOTE — ED Notes (Signed)
ED TO INPATIENT HANDOFF REPORT  ED Nurse Name and Phone #: Vernona Rieger 67  S Name/Age/Gender Anthony [name] 67 y.o. male Room/Bed: 007C/007C  Code Status   Code Status: Full Code  Home/SNF/Other [facility] Patient oriented to: self, place, and situation Is this baseline? Yes   Triage Complete: Triage complete  Chief Complaint Pulmonary embolism (HCC) [I26.99] Pulmonary embolus (HCC) [I26.99]  Triage Note Pt to ED via EMS from store. Pt lives at [facility]. Pt initially called EMS for CP. Pt has been lethargic, but AAOx4 for EMS. Pt c/o SOB. Pt has hx of CHF. Pt has bilateral lower extremity swelling. Pt's CBG was 62 initially for EMS. EMS gave 50cc of D10 en route. Pt's CBG came up to 128 with EMS. Pt reports no PO intake since last night. Pt's HR was also 130s a fib RVR with EMS. EMS gave 100cc NS en route. HR did not change for EMS. Pt was slightly hypotensive with EMS with a manual BP of 106/80. Pt c/o abd and chest pain.   EMS: 130 HR 106/80 26 RR 62 CBG 99% RA   Allergies Allergies  Allergen Reactions   Other Itching and Other (See Comments)    Seasonal allergies- Itchy eyes, runny nose, congestion   Poison Ivy Extract Rash    Level of Care/Admitting Diagnosis ED Disposition     ED Disposition  Admit   Condition  --   Comment  Hospital Area: [facility] [[id number]]  Level of Care: Progressive [102]  Admit to Progressive based on following criteria: CARDIOVASCULAR & THORACIC of moderate stability with acute coronary syndrome symptoms/low risk myocardial infarction/hypertensive urgency/arrhythmias/heart failure potentially compromising stability and stable post cardiovascular intervention patients.  May admit patient to [facility] or [facility] if equivalent level of care is available:: No  Covid Evaluation: Asymptomatic - no recent exposure (last 10 days) testing not required  Diagnosis: Pulmonary embolus [facility]) [[id number]]  Admitting Physician: Zigmund Daniel [phone]  Attending Physician: Shaune Spittle, Vanice Sarah [phone]  Certification:: I certify this patient will need inpatient services for at least 2 midnights  Estimated Length of Stay: 2          B Medical/Surgery History Past Medical History:  Diagnosis Date   Actinic keratosis 11/27/2016   Alcohol abuse with alcohol-induced mood disorder (HCC) 07/18/2018   Alcohol use disorder, severe, dependence (HCC) 09/16/2006   05/22/2015 Argumentative, belligerent and verbally abusive to staff per his note from [place]    Alcoholism [facility])    Anxiety state 04/25/2018   Aortic atherosclerosis (HCC) 08/27/2022   Chronic low back pain 09/16/2006   Followed by NS.  Per his chart from [place], he fell off two storyhouse roof while cleaning his brother's gutters and sustained compression fracture of L3, L4 and L5 and fracture of right transverse process at L3 and nondisplaced fracture of left glenoid rim many years ago. He had multiple imaging in [place] including Lumbar MRI in 2016 which showed moderate to severe spinal canal stenos   Delirium tremens (HCC) 09/01/2017   Depression    Dry eye 11/23/2016   Foreign body in middle portion of esophagus 05/19/2022   Hiatal hernia 09/14/2016   Status Collis gastroplasty and Nissen's fundoplication on 04/29/2015 in [place]   History of delirium tremons 07/22/2020   DTs during 10/2016 08/2017. And 12/2017 admissions    History of pulmonary embolus (PE) 11/02/2016   Unprovoked 11/01/2016. Xarelto from 11/01/16 to 09/08/2017.   Hydrocele    Surgically  corrected   Hypertension    Hypoalbuminemia due to protein-calorie malnutrition (HCC) 07/22/2020   Lumbar compression fracture (HCC)    Multiple rib fractures 07/22/2019   Left rib details XR: left ribs demonstrate multiple remote rib   Pancytopenia (HCC)    Rhabdomyolysis 07/22/2020   Skin cancer    exciced 2017   Tinea versicolor 06/08/2013   Tobacco use disorder 07/22/2020    Tooth infection 04/25/2018   Trigger finger, acquired 11/18/2012   Ulnar tunnel syndrome of right wrist 11/08/2012   Past Surgical History:  Procedure Laterality Date   BIOPSY  05/21/2022   Procedure: BIOPSY;  Surgeon: Lynann Bologna, MD;  Location: [facility] ENDOSCOPY;  Service: Gastroenterology;;   CATARACT EXTRACTION     ESOPHAGOGASTRODUODENOSCOPY (EGD) WITH PROPOFOL N/A 05/21/2022   Procedure: ESOPHAGOGASTRODUODENOSCOPY (EGD) WITH PROPOFOL;  Surgeon: Lynann Bologna, MD;  Location: [facility] ENDOSCOPY;  Service: Gastroenterology;  Laterality: N/A;   FOREIGN BODY REMOVAL N/A 05/21/2022   Procedure: FOREIGN BODY REMOVAL;  Surgeon: Lynann Bologna, MD;  Location: [facility] ENDOSCOPY;  Service: Gastroenterology;  Laterality: N/A;   HIATAL HERNIA REPAIR  2016   HYDROCELE EXCISION / REPAIR  2010   SKIN CANCER EXCISION Left    Excised 2017     A IV Location/Drains/Wounds Patient Lines/Drains/Airways Status     Active Line/Drains/Airways     Name Placement date Placement time Site Days   Peripheral IV 02/03/23 20 G Left;Posterior Forearm 02/03/23  1300  Forearm  1   Peripheral IV 02/03/23 20 G Anterior;Right Forearm 02/03/23  1311  Forearm  1            Intake/Output Last 24 hours  Intake/Output Summary (Last 24 hours) at 02/04/2023 1423 Last data filed at 02/04/2023 1317 Gross per 24 hour  Intake 956.67 ml  Output 400 ml  Net 556.67 ml    Labs/Imaging Results for orders placed or performed during the hospital encounter of 02/03/23 (from the past 48 hour(s))  CBG monitoring, ED     Status: None   Collection Time: 02/03/23  1:06 PM  Result Value Ref Range   Glucose-Capillary 99 70 - 99 mg/dL    Comment: Glucose reference range applies only to samples taken after fasting for at least 8 hours.  Comprehensive metabolic panel     Status: Abnormal   Collection Time: 02/03/23  1:13 PM  Result Value Ref Range   Sodium 123 (L) 135 - 145 mmol/L   Potassium 3.7 3.5 - 5.1 mmol/L   Chloride 96 (L) 98 -  111 mmol/L   CO2 14 (L) 22 - 32 mmol/L   Glucose, Bld 86 70 - 99 mg/dL    Comment: Glucose reference range applies only to samples taken after fasting for at least 8 hours.   BUN 8 8 - 23 mg/dL   Creatinine, Ser 8.41 0.61 - 1.24 mg/dL   Calcium 8.0 (L) 8.9 - 10.3 mg/dL   Total Protein 7.1 6.5 - 8.1 g/dL   Albumin 3.3 (L) 3.5 - 5.0 g/dL   AST 50 (H) 15 - 41 U/L   ALT 23 0 - 44 U/L   Alkaline Phosphatase 138 (H) 38 - 126 U/L   Total Bilirubin 1.4 (H) 0.3 - 1.2 mg/dL   GFR, Estimated >32 >44 mL/min    Comment: (NOTE) Calculated using the CKD-EPI Creatinine Equation (2021)    Anion gap 13 5 - 15    Comment: Performed at [facility] Lab, [address], [place], [place] [address]  CBC  with Differential/Platelet     Status: Abnormal   Collection Time: 02/03/23  1:13 PM  Result Value Ref Range   WBC 4.6 4.0 - 10.5 K/uL   RBC 2.83 (L) 4.22 - 5.81 MIL/uL   Hemoglobin 9.6 (L) 13.0 - 17.0 g/dL   HCT 16.1 (L) 09.6 - 04.5 %   MCV 101.1 (H) 80.0 - 100.0 fL   MCH 33.9 26.0 - 34.0 pg   MCHC 33.6 30.0 - 36.0 g/dL   RDW 40.9 (H) 81.1 - 91.4 %   Platelets 216 150 - 400 K/uL   nRBC 0.0 0.0 - 0.2 %   Neutrophils Relative % 56 %   Neutro Abs 2.6 1.7 - 7.7 K/uL   Lymphocytes Relative 28 %   Lymphs Abs 1.3 0.7 - 4.0 K/uL   Monocytes Relative 12 %   Monocytes Absolute 0.6 0.1 - 1.0 K/uL   Eosinophils Relative 2 %   Eosinophils Absolute 0.1 0.0 - 0.5 K/uL   Basophils Relative 2 %   Basophils Absolute 0.1 0.0 - 0.1 K/uL   Immature Granulocytes 0 %   Abs Immature Granulocytes 0.02 0.00 - 0.07 K/uL    Comment: Performed at [facility] Lab, [address], [place], [place] [address]  Magnesium     Status: Abnormal   Collection Time: 02/03/23  1:13 PM  Result Value Ref Range   Magnesium 1.2 (L) 1.7 - 2.4 mg/dL    Comment: Performed at [facility], [address], [place], [place] [address]  Troponin I (High Sensitivity)     Status: None   Collection Time: 02/03/23  1:13 PM  Result  Value Ref Range   Troponin I (High Sensitivity) 11 <18 ng/L    Comment: (NOTE) Elevated high sensitivity troponin I (hsTnI) values and significant  changes across serial measurements may suggest ACS but many other  chronic and acute conditions are known to elevate hsTnI results.  Refer to the "Links" section for chest pain algorithms and additional  guidance. Performed at [facility], [address] [place], [place] [address]   Brain natriuretic peptide     Status: Abnormal   Collection Time: 02/03/23  1:14 PM  Result Value Ref Range   B Natriuretic Peptide 817.0 (H) 0.0 - 100.0 pg/mL    Comment: Performed at [facility] Lab, [address], [place], [place] [address]  Ethanol     Status: Abnormal   Collection Time: 02/03/23  1:14 PM  Result Value Ref Range   Alcohol, Ethyl (B) 61 (H) <10 mg/dL    Comment: (NOTE) Lowest detectable limit for serum alcohol is 10 mg/dL.  For medical purposes only. Performed at [facility], [address] [place], [place] [address]   I-Stat Chem 8, ED     Status: Abnormal   Collection Time: 02/03/23  2:05 PM  Result Value Ref Range   Sodium 124 (L) 135 - 145 mmol/L   Potassium 3.6 3.5 - 5.1 mmol/L   Chloride 95 (L) 98 - 111 mmol/L   BUN 8 8 - 23 mg/dL   Creatinine, Ser 1.32 0.61 - 1.24 mg/dL   Glucose, Bld 81 70 - 99 mg/dL    Comment: Glucose reference range applies only to samples taken after fasting for at least 8 hours.   Calcium, Ion 1.07 (L) 1.15 - 1.40 mmol/L   TCO2 14 (L) 22 - 32 mmol/L   Hemoglobin 11.6 (L) 13.0 - 17.0 g/dL   HCT  34.0 (L) 39.0 - 52.0 %  CBG monitoring, ED     Status: Abnormal   Collection Time: 02/03/23  2:36 PM  Result Value Ref Range   Glucose-Capillary 52 (L) 70 - 99 mg/dL    Comment: Glucose reference range applies only to samples taken after fasting for at least 8 hours.  Resp panel by RT-PCR (RSV, Flu A&B, Covid) Anterior Nasal Swab     Status: None   Collection Time: 02/03/23  2:39 PM    Specimen: Anterior Nasal Swab  Result Value Ref Range   SARS Coronavirus 2 by RT PCR NEGATIVE NEGATIVE   Influenza A by PCR NEGATIVE NEGATIVE   Influenza B by PCR NEGATIVE NEGATIVE    Comment: (NOTE) The Xpert Xpress SARS-CoV-2/FLU/RSV plus assay is intended as an aid in the diagnosis of influenza from Nasopharyngeal swab specimens and should not be used as a sole basis for treatment. Nasal washings and aspirates are unacceptable for Xpert Xpress SARS-CoV-2/FLU/RSV testing.  Fact Sheet for Patients: BloggerCourse.com  Fact Sheet for Healthcare Providers: SeriousBroker.it  This test is not yet approved or cleared by the [place] FDA and has been authorized for detection and/or diagnosis of SARS-CoV-2 by FDA under an Emergency Use Authorization (EUA). This EUA will remain in effect (meaning this test can be used) for the duration of the COVID-19 declaration under Section 564(b)(1) of the Act, 21 U.S.C. section 360bbb-3(b)(1), unless the authorization is terminated or revoked.     Resp Syncytial Virus by PCR NEGATIVE NEGATIVE    Comment: (NOTE) Fact Sheet for Patients: BloggerCourse.com  Fact Sheet for Healthcare Providers: SeriousBroker.it  This test is not yet approved or cleared by the [place] FDA and has been authorized for detection and/or diagnosis of SARS-CoV-2 by FDA under an Emergency Use Authorization (EUA). This EUA will remain in effect (meaning this test can be used) for the duration of the COVID-19 declaration under Section 564(b)(1) of the Act, 21 U.S.C. section 360bbb-3(b)(1), unless the authorization is terminated or revoked.  Performed at [facility], [address], [place], [place] [address]   CBG monitoring, ED     Status: Abnormal   Collection Time: 02/03/23  3:01 PM  Result Value Ref Range   Glucose-Capillary 60 (L) 70 - 99 mg/dL     Comment: Glucose reference range applies only to samples taken after fasting for at least 8 hours.  Troponin I (High Sensitivity)     Status: None   Collection Time: 02/03/23  3:08 PM  Result Value Ref Range   Troponin I (High Sensitivity) 8 <18 ng/L    Comment: (NOTE) Elevated high sensitivity troponin I (hsTnI) values and significant  changes across serial measurements may suggest ACS but many other  chronic and acute conditions are known to elevate hsTnI results.  Refer to the "Links" section for chest pain algorithms and additional  guidance. Performed at [facility], [address], [place], [place] [address]   CBG monitoring, ED     Status: Abnormal   Collection Time: 02/03/23  3:49 PM  Result Value Ref Range   Glucose-Capillary 177 (H) 70 - 99 mg/dL    Comment: Glucose reference range applies only to samples taken after fasting for at least 8 hours.  CBG monitoring, ED     Status: Abnormal   Collection Time: 02/03/23  6:13 PM  Result Value Ref Range   Glucose-Capillary 108 (H) 70 - 99 mg/dL    Comment: Glucose reference range applies  only to samples taken after fasting for at least 8 hours.  Urinalysis, Routine w reflex microscopic -Urine, Clean Catch     Status: Abnormal   Collection Time: 02/03/23  8:57 PM  Result Value Ref Range   Color, Urine AMBER (A) YELLOW    Comment: BIOCHEMICALS MAY BE AFFECTED BY COLOR   APPearance CLEAR CLEAR   Specific Gravity, Urine 1.044 (H) 1.005 - 1.030   pH 5.0 5.0 - 8.0   Glucose, UA NEGATIVE NEGATIVE mg/dL   Hgb urine dipstick NEGATIVE NEGATIVE   Bilirubin Urine NEGATIVE NEGATIVE   Ketones, ur NEGATIVE NEGATIVE mg/dL   Protein, ur 30 (A) NEGATIVE mg/dL   Nitrite NEGATIVE NEGATIVE   Leukocytes,Ua NEGATIVE NEGATIVE   RBC / HPF 0-5 0 - 5 RBC/hpf   WBC, UA 0-5 0 - 5 WBC/hpf   Bacteria, UA NONE SEEN NONE SEEN   Squamous Epithelial / HPF 0-5 0 - 5 /HPF   Mucus PRESENT    Hyaline Casts, UA PRESENT     Comment: Performed at [facility], [address], [place], [place] [address]  Basic metabolic panel     Status: Abnormal   Collection Time: 02/03/23 10:52 PM  Result Value Ref Range   Sodium 121 (L) 135 - 145 mmol/L   Potassium 4.7 3.5 - 5.1 mmol/L   Chloride 94 (L) 98 - 111 mmol/L   CO2 14 (L) 22 - 32 mmol/L   Glucose, Bld 122 (H) 70 - 99 mg/dL    Comment: Glucose reference range applies only to samples taken after fasting for at least 8 hours.   BUN 7 (L) 8 - 23 mg/dL   Creatinine, Ser 6.04 0.61 - 1.24 mg/dL   Calcium 7.6 (L) 8.9 - 10.3 mg/dL   GFR, Estimated >54 >09 mL/min    Comment: (NOTE) Calculated using the CKD-EPI Creatinine Equation (2021)    Anion gap 13 5 - 15    Comment: Performed at [facility], [address] [place], [place] [address]  CBG monitoring, ED     Status: Abnormal   Collection Time: 02/03/23 11:44 PM  Result Value Ref Range   Glucose-Capillary 124 (H) 70 - 99 mg/dL    Comment: Glucose reference range applies only to samples taken after fasting for at least 8 hours.  CBC     Status: Abnormal   Collection Time: 02/04/23 12:51 AM  Result Value Ref Range   WBC 5.0 4.0 - 10.5 K/uL   RBC 2.83 (L) 4.22 - 5.81 MIL/uL   Hemoglobin 9.6 (L) 13.0 - 17.0 g/dL   HCT 47.8 (L) 29.5 - 62.1 %   MCV 101.8 (H) 80.0 - 100.0 fL   MCH 33.9 26.0 - 34.0 pg   MCHC 33.3 30.0 - 36.0 g/dL   RDW 30.8 (H) 65.7 - 84.6 %   Platelets 236 150 - 400 K/uL   nRBC 0.0 0.0 - 0.2 %    Comment: Performed at [facility], [address], [place], [place] [address]  Heparin level (unfractionated)     Status: Abnormal   Collection Time: 02/04/23 12:51 AM  Result Value Ref Range   Heparin Unfractionated <0.10 (L) 0.30 - 0.70 IU/mL    Comment: (NOTE) The clinical reportable range upper limit is being lowered to >1.10 to align with the FDA approved guidance for the current laboratory assay.  If heparin results are below expected values, and patient dosage has  been confirmed, suggest follow up  testing of antithrombin III levels. Performed at [facility], [address], [place], [place] [address]   APTT     Status: Abnormal   Collection Time: 02/04/23 12:51 AM  Result Value Ref Range   aPTT 79 (H) 24 - 36 seconds    Comment:        IF BASELINE aPTT IS ELEVATED, SUGGEST PATIENT RISK ASSESSMENT BE USED TO DETERMINE APPROPRIATE ANTICOAGULANT THERAPY. Performed at [facility], [address] [place], [place] [address]   Heparin level (unfractionated)     Status: Abnormal   Collection Time: 02/04/23 12:51 AM  Result Value Ref Range   Heparin Unfractionated 0.10 (L) 0.30 - 0.70 IU/mL    Comment: (NOTE) The clinical reportable range upper limit is being lowered to >1.10 to align with the FDA approved guidance for the current laboratory assay.  If heparin results are below expected values, and patient dosage has  been confirmed, suggest follow up testing of antithrombin III levels. Performed at [facility], [address], [place], [place] [address]   CK     Status: None   Collection Time: 02/04/23 12:51 AM  Result Value Ref Range   Total CK 193 49 - 397 U/L    Comment: Performed at [facility], [address] [place], [place] [address]  Ammonia     Status: None   Collection Time: 02/04/23 12:51 AM  Result Value Ref Range   Ammonia 32 9 - 35 umol/L    Comment: HEMOLYSIS AT THIS LEVEL MAY AFFECT RESULT Performed at [facility], [address] [place], [place] [address]   Creatinine, urine, random     Status: None   Collection Time: 02/04/23 12:51 AM  Result Value Ref Range   Creatinine, Urine 134 mg/dL    Comment: Performed at [facility], [address], [place], [place] [address]  Osmolality     Status: Abnormal   Collection Time: 02/04/23 12:51 AM  Result Value Ref Range   Osmolality 259 (L) 275 - 295 mOsm/kg    Comment: Performed at [facility] Lab, [address], [place], [place] [address]  Osmolality, urine      Status: None   Collection Time: 02/04/23 12:51 AM  Result Value Ref Range   Osmolality, Ur 505 300 - 900 mOsm/kg    Comment: Performed at [facility], [address], [place], [place] [address]  Phosphorus     Status: None   Collection Time: 02/04/23 12:51 AM  Result Value Ref Range   Phosphorus 3.3 2.5 - 4.6 mg/dL    Comment: Performed at [facility] Lab, [address] [place], [place] [address]  TSH     Status: Abnormal   Collection Time: 02/04/23 12:51 AM  Result Value Ref Range   TSH 5.143 (H) 0.350 - 4.500 uIU/mL    Comment: Performed by a 3rd Generation assay with a functional sensitivity of <=0.01 uIU/mL. Performed at [facility], [address], [place], [place] [address]   Urinalysis, Complete w Microscopic -Urine, Clean Catch     Status: Abnormal   Collection Time: 02/04/23 12:51 AM  Result Value Ref Range   Color, Urine AMBER (A) YELLOW    Comment: BIOCHEMICALS MAY BE AFFECTED BY COLOR   APPearance CLEAR CLEAR   Specific Gravity, Urine 1.045 (H) 1.005 - 1.030   pH 5.0 5.0 - 8.0   Glucose, UA NEGATIVE NEGATIVE  mg/dL   Hgb urine dipstick NEGATIVE NEGATIVE   Bilirubin Urine NEGATIVE NEGATIVE   Ketones, ur NEGATIVE NEGATIVE mg/dL   Protein, ur NEGATIVE NEGATIVE mg/dL   Nitrite NEGATIVE NEGATIVE   Leukocytes,Ua NEGATIVE NEGATIVE   RBC / HPF 0-5 0 - 5 RBC/hpf   WBC, UA 0-5 0 - 5 WBC/hpf   Bacteria, UA NONE SEEN NONE SEEN   Squamous Epithelial / HPF 0-5 0 - 5 /HPF   Mucus PRESENT    Hyaline Casts, UA PRESENT     Comment: Performed at [facility] Lab, [address] [place], [place] [address]  Sodium, urine, random     Status: None   Collection Time: 02/04/23 12:51 AM  Result Value Ref Range   Sodium, Ur <10 mmol/L    Comment: Performed at [facility], [address], [place], [place] [address]  Prealbumin     Status: Abnormal   Collection Time: 02/04/23 12:51 AM  Result Value Ref Range   Prealbumin <5 (L) 18 - 38 mg/dL    Comment:  Performed at [facility] Lab, [address], [place], [place] [address]  Hepatic function panel     Status: Abnormal   Collection Time: 02/04/23 12:51 AM  Result Value Ref Range   Total Protein 6.4 (L) 6.5 - 8.1 g/dL   Albumin 2.8 (L) 3.5 - 5.0 g/dL   AST 45 (H) 15 - 41 U/L   ALT 23 0 - 44 U/L   Alkaline Phosphatase 137 (H) 38 - 126 U/L   Total Bilirubin 0.9 0.3 - 1.2 mg/dL   Bilirubin, Direct 0.4 (H) 0.0 - 0.2 mg/dL   Indirect Bilirubin 0.5 0.3 - 0.9 mg/dL    Comment: Performed at [facility] Lab, [address] [place], [place] [address]  Iron and TIBC     Status: Abnormal   Collection Time: 02/04/23 12:51 AM  Result Value Ref Range   Iron 21 (L) 45 - 182 ug/dL   TIBC 657 846 - 962 ug/dL   Saturation Ratios 6 (L) 17.9 - 39.5 %   UIBC 332 ug/dL    Comment: Performed at [facility] Lab, [address] [place], [place] [address]  Ferritin     Status: None   Collection Time: 02/04/23 12:51 AM  Result Value Ref Range   Ferritin 74 24 - 336 ng/mL    Comment: Performed at [facility], [address] [place], [place] [address]  Reticulocytes     Status: Abnormal   Collection Time: 02/04/23 12:51 AM  Result Value Ref Range   Retic Ct Pct 3.3 (H) 0.4 - 3.1 %   RBC. 2.78 (L) 4.22 - 5.81 MIL/uL   Retic Count, Absolute 90.9 19.0 - 186.0 K/uL   Immature Retic Fract 21.3 (H) 2.3 - 15.9 %    Comment: Performed at [facility] Lab, [address] [place], [place] [address]  Magnesium     Status: Abnormal   Collection Time: 02/04/23 12:51 AM  Result Value Ref Range   Magnesium 1.3 (L) 1.7 - 2.4 mg/dL    Comment: Performed at [facility] Lab, [address], [place], [place] [address]  Lactic acid, plasma     Status: Abnormal   Collection Time: 02/04/23 12:51 AM  Result Value Ref Range   Lactic Acid, Venous 3.3 (HH) 0.5 - 1.9 mmol/L    Comment: CRITICAL RESULT CALLED TO, READ BACK BY AND VERIFIED WITH E. TURNBOW RN  02/04/23 @0155  BY J. WHITE Performed at [facility] Lab, [address] [place], [place] [address]   Beta-hydroxybutyric acid     Status: Abnormal   Collection Time: 02/04/23 12:51 AM  Result Value Ref Range   Beta-Hydroxybutyric Acid 1.01 (H) 0.05 - 0.27 mmol/L    Comment: Performed at [facility] Lab, [address] [place], [place] [address]  Comprehensive metabolic panel     Status: Abnormal   Collection Time: 02/04/23 12:51 AM  Result Value Ref Range   Sodium 121 (L) 135 - 145 mmol/L   Potassium 4.7 3.5 - 5.1 mmol/L   Chloride 93 (L) 98 - 111 mmol/L   CO2 12 (L) 22 - 32 mmol/L   Glucose, Bld 133 (H) 70 - 99 mg/dL    Comment: Glucose reference range applies only to samples taken after fasting for at least 8 hours.   BUN 6 (L) 8 - 23 mg/dL   Creatinine, Ser 8.29 0.61 - 1.24 mg/dL   Calcium 7.5 (L) 8.9 - 10.3 mg/dL   Total Protein 6.4 (L) 6.5 - 8.1 g/dL   Albumin 2.9 (L) 3.5 - 5.0 g/dL   AST 49 (H) 15 - 41 U/L   ALT 23 0 - 44 U/L   Alkaline Phosphatase 140 (H) 38 - 126 U/L   Total Bilirubin 1.2 0.3 - 1.2 mg/dL   GFR, Estimated >56 >21 mL/min    Comment: (NOTE) Calculated using the CKD-EPI Creatinine Equation (2021)    Anion gap 16 (H) 5 - 15    Comment: Performed at [facility] Lab, [address] [place], [place] [address]  Magnesium     Status: Abnormal   Collection Time: 02/04/23 12:51 AM  Result Value Ref Range   Magnesium 1.3 (L) 1.7 - 2.4 mg/dL    Comment: Performed at [facility] Lab, [address] [place], [place] [address]  Phosphorus     Status: None   Collection Time: 02/04/23 12:51 AM  Result Value Ref Range   Phosphorus 3.4 2.5 - 4.6 mg/dL    Comment: Performed at [facility] Lab, [address], [place], [place] [address]  Brain natriuretic peptide     Status: Abnormal   Collection Time: 02/04/23 12:51 AM  Result Value Ref Range   B Natriuretic Peptide 1,077.8 (H) 0.0 - 100.0 pg/mL    Comment: Performed at [facility] Lab, [address] [place], [place] [address]  I-Stat venous  blood gas, ED     Status: Abnormal   Collection Time: 02/04/23  2:26 AM  Result Value Ref Range   pH, Ven 7.348 7.25 - 7.43   pCO2, Ven 24.7 (L) 44 - 60 mmHg   pO2, Ven 38 32 - 45 mmHg   Bicarbonate 13.6 (L) 20.0 - 28.0 mmol/L   TCO2 14 (L) 22 - 32 mmol/L   O2 Saturation 71 %   Acid-base deficit 11.0 (H) 0.0 - 2.0 mmol/L   Sodium 124 (L) 135 - 145 mmol/L   Potassium 5.4 (H) 3.5 - 5.1 mmol/L   Calcium, Ion 0.90 (L) 1.15 - 1.40 mmol/L   HCT 32.0 (L) 39.0 - 52.0 %   Hemoglobin 10.9 (L) 13.0 - 17.0 g/dL   Sample type VENOUS    Comment NOTIFIED PHYSICIAN   CBG monitoring, ED     Status: Abnormal   Collection Time: 02/04/23  2:47 AM  Result Value Ref Range   Glucose-Capillary 122 (H) 70 - 99 mg/dL  Comment: Glucose reference range applies only to samples taken after fasting for at least 8 hours.  Folate     Status: None   Collection Time: 02/04/23  3:00 AM  Result Value Ref Range   Folate 12.2 >5.9 ng/mL    Comment: Performed at [facility], [address], [place], [place] [address]  Basic metabolic panel     Status: Abnormal   Collection Time: 02/04/23  3:00 AM  Result Value Ref Range   Sodium 122 (L) 135 - 145 mmol/L   Potassium 4.5 3.5 - 5.1 mmol/L   Chloride 95 (L) 98 - 111 mmol/L   CO2 14 (L) 22 - 32 mmol/L   Glucose, Bld 135 (H) 70 - 99 mg/dL    Comment: Glucose reference range applies only to samples taken after fasting for at least 8 hours.   BUN 7 (L) 8 - 23 mg/dL   Creatinine, Ser 6.04 0.61 - 1.24 mg/dL   Calcium 7.5 (L) 8.9 - 10.3 mg/dL   GFR, Estimated >54 >09 mL/min    Comment: (NOTE) Calculated using the CKD-EPI Creatinine Equation (2021)    Anion gap 13 5 - 15    Comment: Performed at [facility], [address] [place], [place] [address]  CBG monitoring, ED     Status: Abnormal   Collection Time: 02/04/23  8:03 AM  Result Value Ref Range   Glucose-Capillary 144 (H) 70 - 99 mg/dL    Comment: Glucose reference range applies only to samples  taken after fasting for at least 8 hours.   Comment 1 Notify RN    Comment 2 Document in Chart   Vitamin B12     Status: Abnormal   Collection Time: 02/04/23  8:07 AM  Result Value Ref Range   Vitamin B-12 1,497 (H) 180 - 914 pg/mL    Comment: HEMOLYSIS AT THIS LEVEL MAY AFFECT RESULT (NOTE) This assay is not validated for testing neonatal or myeloproliferative syndrome specimens for Vitamin B12 levels. Performed at [facility], [address] [place], [place] [address]   Heparin level (unfractionated)     Status: Abnormal   Collection Time: 02/04/23  8:07 AM  Result Value Ref Range   Heparin Unfractionated 0.15 (L) 0.30 - 0.70 IU/mL    Comment: (NOTE) The clinical reportable range upper limit is being lowered to >1.10 to align with the FDA approved guidance for the current laboratory assay.  If heparin results are below expected values, and patient dosage has  been confirmed, suggest follow up testing of antithrombin III levels. Performed at [facility], [address], [place], [place] [address]   Basic metabolic panel     Status: Abnormal   Collection Time: 02/04/23 12:59 PM  Result Value Ref Range   Sodium 121 (L) 135 - 145 mmol/L   Potassium 4.1 3.5 - 5.1 mmol/L   Chloride 97 (L) 98 - 111 mmol/L   CO2 15 (L) 22 - 32 mmol/L   Glucose, Bld 123 (H) 70 - 99 mg/dL    Comment: Glucose reference range applies only to samples taken after fasting for at least 8 hours.   BUN 8 8 - 23 mg/dL   Creatinine, Ser 0.86 0.61 - 1.24 mg/dL   Calcium 7.0 (L) 8.9 - 10.3 mg/dL   GFR, Estimated >57 >84 mL/min    Comment: (NOTE) Calculated using the CKD-EPI Creatinine Equation (2021)    Anion gap 9 5 - 15  Comment: Performed at [facility], [address], [place], [place] [address]   VAS [place] LOWER EXTREMITY VENOUS (DVT)  Result Date: 02/04/2023  Lower Venous DVT Study Patient Name:  Anthony [name]  Date of Exam:   02/04/2023 Medical Rec #: [id number]     Accession #:     [id number] Date of Birth: [birth year]     Patient Gender: M Patient Age:   73 years Exam Location:  [facility] Procedure:      VAS [place] LOWER EXTREMITY VENOUS (DVT) Referring Phys: Jonny Ruiz DOUTOVA --------------------------------------------------------------------------------  Indications: Pulmonary embolism, and Swelling.  Comparison Study: Prior negative bilateral LEV done 11/04/16, prior negative left                   LEV done 05/19/22 Performing Technologist: Sherren Kerns RVS  Examination Guidelines: A complete evaluation includes B-mode imaging, spectral Doppler, color Doppler, and power Doppler as needed of all accessible portions of each vessel. Bilateral testing is considered an integral part of a complete examination. Limited examinations for reoccurring indications may be performed as noted. The reflux portion of the exam is performed with the patient in reverse Trendelenburg.  +--------+---------------+---------+-----------+----------------+-------------+ RIGHT   CompressibilityPhasicitySpontaneityProperties      Thrombus                                                                 Aging         +--------+---------------+---------+-----------+----------------+-------------+ CFV     Full                               pulsatile                                                                waveforms                     +--------+---------------+---------+-----------+----------------+-------------+ SFJ     Full                                                             +--------+---------------+---------+-----------+----------------+-------------+ FV Prox Full                                                             +--------+---------------+---------+-----------+----------------+-------------+ FV Mid  Full                                                              +--------+---------------+---------+-----------+----------------+-------------+  FV      Full                                                             Distal                                                                   +--------+---------------+---------+-----------+----------------+-------------+ PFV     Full                                                             +--------+---------------+---------+-----------+----------------+-------------+ POP     Full                               pulsatile                                                                waveforms                     +--------+---------------+---------+-----------+----------------+-------------+ PTV     Full                                                             +--------+---------------+---------+-----------+----------------+-------------+ PERO    Full                                                             +--------+---------------+---------+-----------+----------------+-------------+   +---------+---------------+---------+-----------+---------------+-------------+ LEFT     CompressibilityPhasicitySpontaneityProperties     Thrombus                                                                 Aging         +---------+---------------+---------+-----------+---------------+-------------+ CFV      Full                               pulsatile  waveforms                    +---------+---------------+---------+-----------+---------------+-------------+ SFJ      Full                                                            +---------+---------------+---------+-----------+---------------+-------------+ FV Prox  Full                                                            +---------+---------------+---------+-----------+---------------+-------------+ FV Mid   Full                                                             +---------+---------------+---------+-----------+---------------+-------------+ FV DistalFull                                                            +---------+---------------+---------+-----------+---------------+-------------+ PFV      Full                                                            +---------+---------------+---------+-----------+---------------+-------------+ POP      Full                               pulsatile                                                                waveforms                    +---------+---------------+---------+-----------+---------------+-------------+ PTV      Full                                                            +---------+---------------+---------+-----------+---------------+-------------+ PERO     Full                                                            +---------+---------------+---------+-----------+---------------+-------------+  Soleal   Full                                                            +---------+---------------+---------+-----------+---------------+-------------+     Summary: BILATERAL: - No evidence of deep vein thrombosis seen in the lower extremities, bilaterally. -No evidence of popliteal cyst, bilaterally.   *See table(s) above for measurements and observations. Electronically signed by Heath Lark on 02/04/2023 at 2:15:34 PM.    Final    ECHOCARDIOGRAM COMPLETE  Result Date: 02/04/2023    ECHOCARDIOGRAM REPORT   Patient Name:   Anthony [name] Date of Exam: 02/04/2023 Medical Rec #:  [id number]    Height:       69.0 in Accession #:    [id number]   Weight:       150.0 lb Date of Birth:  1955/12/21    BSA:          1.828 m Patient Age:    67 years     BP:           115/85 mmHg Patient Gender: M            HR:           105 bpm. Exam Location:  Inpatient Procedure: 2D Echo, Cardiac Doppler and Color Doppler Indications:     pulmonary embolus  History:        Patient has prior history of Echocardiogram examinations, most                 recent 08/28/2022. Arrythmias:Atrial Fibrillation; Risk                 Factors:Hypertension, Dyslipidemia and Alcohol abuse.  Sonographer:    Delcie Roch RDCS Referring Phys: [id number] ANASTASSIA DOUTOVA IMPRESSIONS  1. Left ventricular ejection fraction, by estimation, is 40 to 45%. The left ventricle has mildly decreased function. The left ventricle demonstrates regional wall motion abnormalities (see scoring diagram/findings for description). Left ventricular diastolic function could not be evaluated. There is global LV hypokinesis with akinesis of the basilar to mid inferior wall.  2. Right ventricular systolic function is severely reduced. The right ventricular size is moderately enlarged. Tricuspid regurgitation signal is inadequate for assessing PA pressure.  3. Left atrial size was severely dilated.  4. Right atrial size was severely dilated.  5. The mitral valve is thickened and the posterior leaflet is restricted there is moderate to severe central MR. The mitral valve is abnormal. Moderate to severe mitral valve regurgitation. No evidence of mitral stenosis.  6. The aortic valve is tricuspid. There is mild calcification of the aortic valve. Aortic valve regurgitation is not visualized. Aortic valve sclerosis/calcification is present, without any evidence of aortic stenosis.  7. The inferior vena cava is dilated in size with <50% respiratory variability, suggesting right atrial pressure of 15 mmHg. FINDINGS  Left Ventricle: Left ventricular ejection fraction, by estimation, is 40 to 45%. The left ventricle has mildly decreased function. The left ventricle demonstrates regional wall motion abnormalities. The left ventricular internal cavity size was normal in size. There is no left ventricular hypertrophy. Left ventricular diastolic function could not be evaluated due to atrial fibrillation.  Left ventricular diastolic function could not be evaluated.  LV Wall Scoring: The inferior wall is akinetic. Right Ventricle: The right ventricular  size is moderately enlarged. No increase in right ventricular wall thickness. Right ventricular systolic function is severely reduced. Tricuspid regurgitation signal is inadequate for assessing PA pressure. The tricuspid regurgitant velocity is 2.13 m/s, and with an assumed right atrial pressure of 8 mmHg, the estimated right ventricular systolic pressure is 26.1 mmHg. Left Atrium: Left atrial size was severely dilated. Right Atrium: Right atrial size was severely dilated. Pericardium: There is no evidence of pericardial effusion. Mitral Valve: The mitral valve is thickened and the posterior leaflet is restricted there is moderate to severe central MR. The mitral valve is abnormal. There is moderate thickening of the mitral valve leaflet(s). There is moderate calcification of the mitral valve leaflet(s). Moderate to severe mitral valve regurgitation. No evidence of mitral valve stenosis. Tricuspid Valve: The tricuspid valve is normal in structure. Tricuspid valve regurgitation is not demonstrated. No evidence of tricuspid stenosis. Aortic Valve: The aortic valve is tricuspid. There is mild calcification of the aortic valve. Aortic valve regurgitation is not visualized. Aortic valve sclerosis/calcification is present, without any evidence of aortic stenosis. Pulmonic Valve: The pulmonic valve was normal in structure. Pulmonic valve regurgitation is not visualized. No evidence of pulmonic stenosis. Aorta: The aortic root is normal in size and structure. Venous: The inferior vena cava is dilated in size with less than 50% respiratory variability, suggesting right atrial pressure of 15 mmHg. IAS/Shunts: No atrial level shunt detected by color flow Doppler.  LEFT VENTRICLE PLAX 2D LVIDd:         4.10 cm LVIDs:         3.80 cm LV PW:         0.80 cm LV IVS:        0.70 cm LVOT  diam:     2.10 cm LVOT Area:     3.46 cm  RIGHT VENTRICLE          IVC RV Basal diam:  3.40 cm  IVC diam: 2.30 cm TAPSE (M-mode): 1.4 cm LEFT ATRIUM              Index        RIGHT ATRIUM           Index LA diam:        4.40 cm  2.41 cm/m   RA Area:     20.40 cm LA Vol (A2C):   100.0 ml 54.70 ml/m  RA Volume:   58.50 ml  32.00 ml/m LA Vol (A4C):   76.6 ml  41.90 ml/m LA Biplane Vol: 88.6 ml  48.46 ml/m   AORTA Ao Root diam: 3.30 cm Ao Asc diam:  3.30 cm TRICUSPID VALVE TR Peak grad:   18.1 mmHg TR Vmax:        213.00 cm/s  SHUNTS Systemic Diam: 2.10 cm Arvilla Meres MD Electronically signed by Arvilla Meres MD Signature Date/Time: 02/04/2023/10:27:44 AM    Final    CT Angio Chest PE W and/or Wo Contrast  Result Date: 02/03/2023 CLINICAL DATA:  Pulmonary embolism (PE) suspected, high prob; Abdominal pain, acute, nonlocalized. EXAM: CT ANGIOGRAPHY CHEST CT ABDOMEN AND PELVIS WITH CONTRAST TECHNIQUE: Multidetector CT imaging of the chest was performed using the standard protocol during bolus administration of intravenous contrast. Multiplanar CT image reconstructions and MIPs were obtained to evaluate the vascular anatomy. Multidetector CT imaging of the abdomen and pelvis was performed using the standard protocol during bolus administration of intravenous contrast. RADIATION DOSE REDUCTION: This exam was performed according to the departmental dose-optimization program which includes  automated exposure control, adjustment of the mA and/or kV according to patient size and/or use of iterative reconstruction technique. CONTRAST:  75mL OMNIPAQUE IOHEXOL 350 MG/ML SOLN COMPARISON:  Chest CT 01/06/2023.  CT abdomen/pelvis 05/20/2022. FINDINGS: CTA CHEST FINDINGS Cardiovascular: Acute segmental and subsegmental pulmonary emboli in the posterior right lower lobe (for example, coronal image 46 series 8) with associated developing pulmonary infarct in the lateral basal segment (axial image 219 series 6). No  evidence of acute right heart strain. Extensive coronary artery calcifications. Mediastinum/Nodes: Postoperative changes of prior hiatal hernia repair. No thoracic lymphadenopathy. Lungs/Pleura: Subpleural ground-glass opacity in the left upper lobe subjacent to an acute fracture of the left lateral third rib (axial image 39 series 7), consistent with pulmonary contusion. No pneumothorax. Mucous plugging and developing atelectasis in the medial basal segment of the right lower lobe. Musculoskeletal: Unchanged comminuted fracture of the left clavicular head and subacute fracture of the left lateral fifth rib. Review of the MIP images confirms the above findings. CT ABDOMEN and PELVIS FINDINGS Hepatobiliary: No focal liver abnormality is seen. No gallstones, gallbladder wall thickening, or biliary dilatation. Pancreas: Unremarkable. No pancreatic ductal dilatation or surrounding inflammatory changes. Spleen: Normal. Adrenals/Urinary Tract: Adrenal glands are unremarkable. Kidneys are normal, without renal calculi, focal lesion, or hydronephrosis. Bladder is unremarkable. Stomach/Bowel: Normal stomach and duodenum. No dilated loops of small bowel. Appendix is not visualized. Colonic diverticulosis without evidence of acute diverticulitis. Vascular/Lymphatic: Aortic atherosclerosis. No enlarged abdominal or pelvic lymph nodes. Reproductive: Prostate is unremarkable. Other: Diffuse body wall edema. Musculoskeletal: Unchanged mild anterior compression deformities of the L2 through L5 vertebral bodies. Review of the MIP images confirms the above findings. IMPRESSION: 1. Acute segmental and subsegmental pulmonary emboli in the posterior right lower lobe with associated developing pulmonary infarct in the lateral basal segment. No evidence of acute right heart strain. 2. Acute fracture of the left lateral third rib with associated pulmonary contusion in the left upper lobe. No pneumothorax. 3. Unchanged comminuted fracture  of the left clavicular head and subacute fracture of the left lateral fifth rib. 4. No acute findings in the abdomen or pelvis. 5. Colonic diverticulosis without evidence of acute diverticulitis. Aortic Atherosclerosis (ICD10-I70.0). Electronically Signed   By: Orvan Falconer M.D.   On: 02/03/2023 18:31   CT ABDOMEN PELVIS W CONTRAST  Result Date: 02/03/2023 CLINICAL DATA:  Pulmonary embolism (PE) suspected, high prob; Abdominal pain, acute, nonlocalized. EXAM: CT ANGIOGRAPHY CHEST CT ABDOMEN AND PELVIS WITH CONTRAST TECHNIQUE: Multidetector CT imaging of the chest was performed using the standard protocol during bolus administration of intravenous contrast. Multiplanar CT image reconstructions and MIPs were obtained to evaluate the vascular anatomy. Multidetector CT imaging of the abdomen and pelvis was performed using the standard protocol during bolus administration of intravenous contrast. RADIATION DOSE REDUCTION: This exam was performed according to the departmental dose-optimization program which includes automated exposure control, adjustment of the mA and/or kV according to patient size and/or use of iterative reconstruction technique. CONTRAST:  75mL OMNIPAQUE IOHEXOL 350 MG/ML SOLN COMPARISON:  Chest CT 01/06/2023.  CT abdomen/pelvis 05/20/2022. FINDINGS: CTA CHEST FINDINGS Cardiovascular: Acute segmental and subsegmental pulmonary emboli in the posterior right lower lobe (for example, coronal image 46 series 8) with associated developing pulmonary infarct in the lateral basal segment (axial image 219 series 6). No evidence of acute right heart strain. Extensive coronary artery calcifications. Mediastinum/Nodes: Postoperative changes of prior hiatal hernia repair. No thoracic lymphadenopathy. Lungs/Pleura: Subpleural ground-glass opacity in the left upper lobe subjacent to an acute  fracture of the left lateral third rib (axial image 39 series 7), consistent with pulmonary contusion. No pneumothorax.  Mucous plugging and developing atelectasis in the medial basal segment of the right lower lobe. Musculoskeletal: Unchanged comminuted fracture of the left clavicular head and subacute fracture of the left lateral fifth rib. Review of the MIP images confirms the above findings. CT ABDOMEN and PELVIS FINDINGS Hepatobiliary: No focal liver abnormality is seen. No gallstones, gallbladder wall thickening, or biliary dilatation. Pancreas: Unremarkable. No pancreatic ductal dilatation or surrounding inflammatory changes. Spleen: Normal. Adrenals/Urinary Tract: Adrenal glands are unremarkable. Kidneys are normal, without renal calculi, focal lesion, or hydronephrosis. Bladder is unremarkable. Stomach/Bowel: Normal stomach and duodenum. No dilated loops of small bowel. Appendix is not visualized. Colonic diverticulosis without evidence of acute diverticulitis. Vascular/Lymphatic: Aortic atherosclerosis. No enlarged abdominal or pelvic lymph nodes. Reproductive: Prostate is unremarkable. Other: Diffuse body wall edema. Musculoskeletal: Unchanged mild anterior compression deformities of the L2 through L5 vertebral bodies. Review of the MIP images confirms the above findings. IMPRESSION: 1. Acute segmental and subsegmental pulmonary emboli in the posterior right lower lobe with associated developing pulmonary infarct in the lateral basal segment. No evidence of acute right heart strain. 2. Acute fracture of the left lateral third rib with associated pulmonary contusion in the left upper lobe. No pneumothorax. 3. Unchanged comminuted fracture of the left clavicular head and subacute fracture of the left lateral fifth rib. 4. No acute findings in the abdomen or pelvis. 5. Colonic diverticulosis without evidence of acute diverticulitis. Aortic Atherosclerosis (ICD10-I70.0). Electronically Signed   By: Orvan Falconer M.D.   On: 02/03/2023 18:31   CT Head Wo Contrast  Result Date: 02/03/2023 CLINICAL DATA:  Mental status change  EXAM: CT HEAD WITHOUT CONTRAST TECHNIQUE: Contiguous axial images were obtained from the base of the skull through the vertex without intravenous contrast. RADIATION DOSE REDUCTION: This exam was performed according to the departmental dose-optimization program which includes automated exposure control, adjustment of the mA and/or kV according to patient size and/or use of iterative reconstruction technique. COMPARISON:  01/06/2023 FINDINGS: Brain: No evidence of acute infarction, hemorrhage, mass, mass effect, or midline shift. No hydrocephalus or extra-axial fluid collection. Vascular: No hyperdense vessel. Skull: Negative for fracture or focal lesion. Sinuses/Orbits: No acute finding. Other: The mastoid air cells are well aerated. IMPRESSION: No acute intracranial process. Electronically Signed   By: Wiliam Ke M.D.   On: 02/03/2023 18:16   DG Chest Port 1 View  Result Date: 02/03/2023 CLINICAL DATA:  Shortness of breath and chest pain.  Lethargy. EXAM: PORTABLE CHEST 1 VIEW COMPARISON:  12/02/2022 FINDINGS: Cardiomegaly. Tortuous aorta. Pulmonary venous hypertension, possibly with early interstitial edema. No infiltrate or collapse. No acute bone finding. IMPRESSION: Cardiomegaly. Pulmonary venous hypertension, possibly with early interstitial edema. Electronically Signed   By: Paulina Fusi M.D.   On: 02/03/2023 14:35    Pending Labs Unresulted Labs (From admission, onward)     Start     Ordered   02/05/23 0500  Heparin level (unfractionated)  Daily,   R      02/03/23 1948   02/04/23 1500  Heparin level (unfractionated)  Once-Timed,   TIMED        02/04/23 0852   02/04/23 0600  Basic metabolic panel  Now then every 6 hours,   R (with TIMED occurrences)     Question:  Release to patient  Answer:  Immediate   02/04/23 0140   02/04/23 0500  CBC  Daily,  R      02/03/23 1948   02/03/23 2333  Rapid urine drug screen (hospital performed)  ONCE - STAT,   STAT        02/03/23 2333   02/03/23  2331  Lactic acid, plasma  STAT Now then every 3 hours,   R (with STAT occurrences)      02/03/23 2330   02/03/23 2330  Blood gas, venous  ONCE - STAT,   STAT       Question:  Release to patient  Answer:  Immediate   02/03/23 2329            Vitals/Pain Today's Vitals   02/04/23 1345 02/04/23 1400 02/04/23 1415 02/04/23 1421  BP: 102/80 109/88 (!) 109/90   Pulse: 91 (!) 25 90   Resp: (!) 22 (!) 22 (!) 23   Temp:    98 F (36.7 C)  TempSrc:    Oral  SpO2: 100% (!) 84% 100%   Weight:      Height:      PainSc:        Isolation Precautions No active isolations  Medications Medications  amiodarone (NEXTERONE) 1.8 mg/mL load via infusion 150 mg (150 mg Intravenous Bolus from Bag 02/03/23 1555)    Followed by  amiodarone (NEXTERONE PREMIX) 360-4.14 MG/200ML-% (1.8 mg/mL) IV infusion (0 mg/hr Intravenous Stopped 02/04/23 0202)    Followed by  amiodarone (NEXTERONE PREMIX) 360-4.14 MG/200ML-% (1.8 mg/mL) IV infusion (30 mg/hr Intravenous New Bag/Given 02/04/23 1255)  heparin ADULT infusion 100 units/mL (25000 units/260mL) (1,550 Units/hr Intravenous New Bag/Given 02/04/23 1254)  LORazepam (ATIVAN) tablet 1-4 mg (has no administration in time range)    Or  LORazepam (ATIVAN) injection 1-4 mg (has no administration in time range)  folic acid (FOLVITE) tablet 1 mg (1 mg Oral Given 02/04/23 1013)  multivitamin with minerals tablet 1 tablet (1 tablet Oral Given 02/04/23 1013)  ferrous sulfate tablet 325 mg (325 mg Oral Given 02/04/23 0830)  mometasone-formoterol (DULERA) 200-5 MCG/ACT inhaler 2 puff (2 puffs Inhalation Given 02/04/23 1010)  rosuvastatin (CRESTOR) tablet 10 mg (10 mg Oral Given 02/04/23 1013)  midodrine (PROAMATINE) tablet 10 mg (10 mg Oral Given 02/04/23 0830)  ondansetron (ZOFRAN) tablet 4 mg (has no administration in time range)    Or  ondansetron (ZOFRAN) injection 4 mg (has no administration in time range)  albuterol (PROVENTIL) (2.5 MG/3ML) 0.083% nebulizer solution  2.5 mg (has no administration in time range)  guaiFENesin (MUCINEX) 12 hr tablet 600 mg (600 mg Oral Given 02/04/23 1012)  0.9 %  sodium chloride infusion ( Intravenous New Bag/Given 02/04/23 1129)  thiamine (VITAMIN B1) 500 mg in sodium chloride 0.9 % 50 mL IVPB (0 mg Intravenous Stopped 02/04/23 1317)    Followed by  thiamine (VITAMIN B1) 250 mg in sodium chloride 0.9 % 50 mL IVPB (has no administration in time range)    Followed by  thiamine (VITAMIN B1) tablet 100 mg (has no administration in time range)  chlordiazePOXIDE (LIBRIUM) capsule 25 mg (25 mg Oral Given 02/04/23 1014)    Followed by  chlordiazePOXIDE (LIBRIUM) capsule 25 mg (has no administration in time range)    Followed by  chlordiazePOXIDE (LIBRIUM) capsule 25 mg (has no administration in time range)    Followed by  chlordiazePOXIDE (LIBRIUM) capsule 25 mg (has no administration in time range)  oxyCODONE (Oxy IR/ROXICODONE) immediate release tablet 2.5 mg (has no administration in time range)    Or  oxyCODONE (Oxy IR/ROXICODONE) immediate release tablet  5 mg (has no administration in time range)  lidocaine (LIDODERM) 5 % 3 patch (3 patches Transdermal Patch Applied 02/04/23 1016)  acetaminophen (TYLENOL) tablet 1,000 mg (1,000 mg Oral Given 02/04/23 1011)    Followed by  acetaminophen (TYLENOL) tablet 650 mg (has no administration in time range)  polyethylene glycol (MIRALAX / GLYCOLAX) packet 17 g (17 g Oral Given 02/04/23 1011)  furosemide (LASIX) injection 40 mg (has no administration in time range)  dextrose (D10W) 10% bolus 250 mL (0 mLs Intravenous Stopped 02/03/23 1549)  magnesium sulfate IVPB 2 g 50 mL (0 g Intravenous Stopped 02/03/23 1641)  potassium chloride SA (KLOR-CON M) CR tablet 40 mEq (40 mEq Oral Given 02/03/23 1536)  hydrocortisone sodium succinate (SOLU-CORTEF) 100 MG injection 100 mg (100 mg Intravenous Given 02/03/23 1653)  iohexol (OMNIPAQUE) 350 MG/ML injection 75 mL (75 mLs Intravenous Contrast Given  02/03/23 1808)  heparin bolus via infusion 4,800 Units (4,800 Units Intravenous Bolus from Bag 02/03/23 1956)  heparin bolus via infusion 2,000 Units (2,000 Units Intravenous Bolus from Bag 02/04/23 0218)  heparin bolus via infusion 4,000 Units (4,000 Units Intravenous Bolus from Bag 02/04/23 1023)  furosemide (LASIX) injection 20 mg (20 mg Intravenous Given 02/04/23 1015)    Mobility walks with person assist     Focused Assessments Cardiac Assessment Handoff:  Cardiac Rhythm: Atrial fibrillation Lab Results  Component Value Date   CKTOTAL 193 02/04/2023   TROPONINI 0.03 (HH) 11/02/2016   Lab Results  Component Value Date   DDIMER 1.82 (H) 08/27/2022   Does the Patient currently have chest pain? No    R Recommendations: See Admitting Provider Note  Report given to:   Additional Notes: Condom cath in place

## 2023-02-04 NOTE — Assessment & Plan Note (Signed)
Transient in the setting of decreased p.o. intake currently resolved continue to monitor CBg

## 2023-02-04 NOTE — Assessment & Plan Note (Signed)
Order CIWA protocol Encourage cessation

## 2023-02-04 NOTE — Assessment & Plan Note (Addendum)
in the setting of alcohol abuse.  At baseline runs between 130-133 obtain electrolytes  Patient has received D10 free water noted some drop in sodium. Given PE and hypotension responsive to some fluids we will order normal saline and monitor sodium closely

## 2023-02-04 NOTE — Assessment & Plan Note (Signed)
On arrival ER restarted on amiodarone heart rate has been improving

## 2023-02-05 DIAGNOSIS — I2694 Multiple subsegmental pulmonary emboli without acute cor pulmonale: Secondary | ICD-10-CM | POA: Diagnosis not present

## 2023-02-05 LAB — BASIC METABOLIC PANEL
Anion gap: 9 (ref 5–15)
BUN: 9 mg/dL (ref 8–23)
CO2: 15 mmol/L — ABNORMAL LOW (ref 22–32)
Calcium: 7 mg/dL — ABNORMAL LOW (ref 8.9–10.3)
Chloride: 100 mmol/L (ref 98–111)
Creatinine, Ser: 0.87 mg/dL (ref 0.61–1.24)
GFR, Estimated: >60 mL/min (ref >60)
Glucose, Bld: 122 mg/dL — ABNORMAL HIGH (ref 70–99)
Potassium: 3.6 mmol/L (ref 3.5–5.1)
Sodium: 124 mmol/L — ABNORMAL LOW (ref 135–145)

## 2023-02-05 LAB — HEPARIN LEVEL (UNFRACTIONATED)
Heparin Unfractionated: 0.58 IU/mL (ref 0.30–0.70)
Heparin Unfractionated: 0.46 IU/mL (ref 0.30–0.70)

## 2023-02-05 LAB — CBC
HCT: 27.4 % — ABNORMAL LOW (ref 39.0–52.0)
Hemoglobin: 9.1 g/dL — ABNORMAL LOW (ref 13.0–17.0)
MCH: 33.5 pg (ref 26.0–34.0)
MCHC: 33.2 g/dL (ref 30.0–36.0)
MCV: 100.7 fL — ABNORMAL HIGH (ref 80.0–100.0)
Platelets: 201 10*3/uL (ref 150–400)
RBC: 2.72 MIL/uL — ABNORMAL LOW (ref 4.22–5.81)
RDW: 22.5 % — ABNORMAL HIGH (ref 11.5–15.5)
WBC: 5.1 10*3/uL (ref 4.0–10.5)
nRBC: 0 % (ref 0.0–0.2)

## 2023-02-05 MED ORDER — DIPHENHYDRAMINE HCL 25 MG PO CAPS
ORAL_CAPSULE | ORAL | Status: AC
  Filled 2023-02-05: qty 1

## 2023-02-05 MED ORDER — DIPHENHYDRAMINE HCL 25 MG PO CAPS
25.0000 mg | ORAL_CAPSULE | Freq: Four times a day (QID) | ORAL | Status: DC | PRN
  Administered 2023-02-05: 25 mg via ORAL

## 2023-02-05 NOTE — Progress Notes (Signed)
PROGRESS NOTE    Anthony [name]  WUJ:[id number] DOB: 22-Feb-1956 DOA: 02/03/2023 PCP: Pcp, No   Brief Narrative:  This 67 y.o. male with medical history significant of alcohol abuse, anxiety, HTN, homelessness, CVA who presented with chest pain and shortness of breath.  He was found to have a pulmonary embolism as well as atrial fibrillation with RVR and soft blood pressures. Cardiology consulted.   Assessment & Plan:   Principal Problem:   Pulmonary embolism (HCC) Active Problems:   Alcohol abuse   HTN (hypertension)   Paroxysmal atrial fibrillation (HCC)   Hypomagnesemia   Atrial fibrillation with RVR (HCC)   Dyslipidemia   Dehydration   Hyponatremia   Rib fractures   Hypoglycemia   Pulmonary embolus (HCC)  Acute Pulmonary Embolism with Infarct: Unclear provoking event, needs workup. CT PE protocol with acute segmental and subsegmental PE in posterior RLL with associated developing pulmonary infarct in the lateral basal segment Continue Heparin gtt for now Venous duplex: No evidence for DVT bilaterally Echo : Right ventricular function severely reduced.   Acute Fracture of L Lateral 3rd Rib with Associated Pulmonary Contusion: Unchanged Comminuted Fracture of the L Clavicular Head and Subacute Fracture of the L Lateral 5th Rib He notes frequent falls, suspect these as the result - the clavicular fracture was dx after fall on 6/19  PT/OT IS, pulmonary toilet Continue Pain management, bowel regimen    Atrial Fibrillation with RVR: Continue Amiodarone, heparin gtt Supposed to be taking diltiazem and metoprolol as well (will hold off on restarting these in the setting of his low normal BP's) Apparently wasn't taking amiodarone or xarelto (related to medication nonadherence in setting of homelessness) TOC c/s medications  Hyponatremia Has bilateral LE edema on exam, suspect this represents hypervolemic hyponatremia vs related to blood clots in the setting of his PE? Will  diurese cautiously given presenting BP's and follow closely   Non anion gap metabolic acidosis  Unclear cause - will follow  VBG with respiratory alkalosis    Etoh Abuse Drinks 6 pack daily (suspect this is low end based on what he told me) Scheduled librium CIWA  Low threshold for phenobarb taper    Dyslipidemia Crestor   Hypomagnesemia Replaced, Continue to monitor   HTN BP's soft on presentation He's actually supposed to be on midodrine at home    Hypoglycemia Monitor, follow A1c   DVT prophylaxis: Heparin gtt Code Status: Full code. Family Communication: No family at bed side. Disposition Plan:    Status is: Inpatient Remains inpatient appropriate because: Need for continued inpatient care   Consultants:  Cardiology  Procedures: None  Antimicrobials: Anti-infectives (From admission, onward)    None       Subjective: Patient was seen and examined at bed side, overnight events noted,  He reports feeling much improved, still feels very weak and fatigued.  Objective: Vitals:   02/04/23 2030 02/05/23 0000 02/05/23 0523 02/05/23 0827  BP:  (!) 117/95 112/81   Pulse: 89 79 84 100  Resp: 19 19 18 18   Temp:  98.2 F (36.8 C) 98 F (36.7 C)   TempSrc:  Oral Oral   SpO2:  95% 98% 99%  Weight:      Height:        Intake/Output Summary (Last 24 hours) at 02/05/2023 1323 Last data filed at 02/05/2023 0653 Gross per 24 hour  Intake --  Output 850 ml  Net -850 ml   Filed Weights   02/03/23 1900 02/04/23 1537  Weight: 68 kg 71.9 kg    Examination:  General exam: Appears calm and comfortable, NAD, deconditioned.  Respiratory system: Clear to auscultation. Respiratory effort normal. RR 15 Cardiovascular system: S1 & S2 heard, RRR. No JVD, murmurs, rubs, gallops or clicks. No pedal edema. Gastrointestinal system: Abdomen is nondistended, soft and nontender. Normal bowel sounds heard. Central nervous system: Alert and oriented. No focal neurological  deficits. Extremities: Symmetric 5 x 5 power. Skin: No rashes, lesions or ulcers Psychiatry: Judgement and insight appear normal. Mood & affect appropriate.     Data Reviewed: I have personally reviewed following labs and imaging studies  CBC: Recent Labs  Lab 02/03/23 1313 02/03/23 1405 02/04/23 0051 02/04/23 0226 02/05/23 0744  WBC 4.6  --  5.0  --  5.1  NEUTROABS 2.6  --   --   --   --   HGB 9.6* 11.6* 9.6* 10.9* 9.1*  HCT 28.6* 34.0* 28.8* 32.0* 27.4*  MCV 101.1*  --  101.8*  --  100.7*  PLT 216  --  236  --  201   Basic Metabolic Panel: Recent Labs  Lab 02/03/23 1313 02/03/23 1405 02/04/23 0051 02/04/23 0226 02/04/23 0300 02/04/23 1259 02/04/23 1828 02/05/23 0744  NA 123*   < > 121* 124* 122* 121* 123* 124*  K 3.7   < > 4.7 5.4* 4.5 4.1 3.9 3.6  CL 96*   < > 93*  --  95* 97* 98 100  CO2 14*   < > 12*  --  14* 15* 14* 15*  GLUCOSE 86   < > 133*  --  135* 123* 114* 122*  BUN 8   < > 6*  --  7* 8 7* 9  CREATININE 1.01   < > 0.82  --  0.79 0.81 0.75 0.87  CALCIUM 8.0*   < > 7.5*  --  7.5* 7.0* 7.1* 7.0*  MG 1.2*  --  1.3*  1.3*  --   --   --   --   --   PHOS  --   --  3.4  3.3  --   --   --   --   --    < > = values in this interval not displayed.   GFR: Estimated Creatinine Clearance: 82.4 mL/min (by C-G formula based on SCr of 0.87 mg/dL). Liver Function Tests: Recent Labs  Lab 02/03/23 1313 02/04/23 0051  AST 50* 49*  45*  ALT 23 23  23   ALKPHOS 138* 140*  137*  BILITOT 1.4* 1.2  0.9  PROT 7.1 6.4*  6.4*  ALBUMIN 3.3* 2.9*  2.8*   No results for input(s): "LIPASE", "AMYLASE" in the last 168 hours. Recent Labs  Lab 02/04/23 0051  AMMONIA 32   Coagulation Profile: No results for input(s): "INR", "PROTIME" in the last 168 hours. Cardiac Enzymes: Recent Labs  Lab 02/04/23 0051  CKTOTAL 193   BNP (last 3 results) No results for input(s): "PROBNP" in the last 8760 hours. HbA1C: No results for input(s): "HGBA1C" in the last 72  hours. CBG: Recent Labs  Lab 02/03/23 1549 02/03/23 1813 02/03/23 2344 02/04/23 0247 02/04/23 0803  GLUCAP 177* 108* 124* 122* 144*   Lipid Profile: No results for input(s): "CHOL", "HDL", "LDLCALC", "TRIG", "CHOLHDL", "LDLDIRECT" in the last 72 hours. Thyroid Function Tests: Recent Labs    02/04/23 0051  TSH 5.143*   Anemia Panel: Recent Labs    02/04/23 0051 02/04/23 0300 02/04/23 0807  [id number]  --   --  1,497*  FOLATE  --  12.2  --   FERRITIN 74  --   --   TIBC 353  --   --   IRON 21*  --   --   RETICCTPCT 3.3*  --   --    Sepsis Labs: Recent Labs  Lab 02/04/23 0051 02/04/23 1827  LATICACIDVEN 3.3* 2.0*    Recent Results (from the past 240 hour(s))  Resp panel by RT-PCR (RSV, Flu A&B, Covid) Anterior Nasal Swab     Status: None   Collection Time: 02/03/23  2:39 PM   Specimen: Anterior Nasal Swab  Result Value Ref Range Status   SARS Coronavirus 2 by RT PCR NEGATIVE NEGATIVE Final   Influenza A by PCR NEGATIVE NEGATIVE Final   Influenza B by PCR NEGATIVE NEGATIVE Final    Comment: (NOTE) The Xpert Xpress SARS-CoV-2/FLU/RSV plus assay is intended as an aid in the diagnosis of influenza from Nasopharyngeal swab specimens and should not be used as a sole basis for treatment. Nasal washings and aspirates are unacceptable for Xpert Xpress SARS-CoV-2/FLU/RSV testing.  Fact Sheet for Patients: BloggerCourse.com  Fact Sheet for Healthcare Providers: SeriousBroker.it  This test is not yet approved or cleared by the [place] FDA and has been authorized for detection and/or diagnosis of SARS-CoV-2 by FDA under an Emergency Use Authorization (EUA). This EUA will remain in effect (meaning this test can be used) for the duration of the COVID-19 declaration under Section 564(b)(1) of the Act, 21 U.S.C. section 360bbb-3(b)(1), unless the authorization is terminated or revoked.     Resp Syncytial Virus by  PCR NEGATIVE NEGATIVE Final    Comment: (NOTE) Fact Sheet for Patients: BloggerCourse.com  Fact Sheet for Healthcare Providers: SeriousBroker.it  This test is not yet approved or cleared by the [place] FDA and has been authorized for detection and/or diagnosis of SARS-CoV-2 by FDA under an Emergency Use Authorization (EUA). This EUA will remain in effect (meaning this test can be used) for the duration of the COVID-19 declaration under Section 564(b)(1) of the Act, 21 U.S.C. section 360bbb-3(b)(1), unless the authorization is terminated or revoked.  Performed at [facility], [address], [place], [place] [address]          Radiology Studies: VAS [place] LOWER EXTREMITY VENOUS (DVT)  Result Date: 02/04/2023  Lower Venous DVT Study Patient Name:  Anthony [name]  Date of Exam:   02/04/2023 Medical Rec #: [id number]     Accession #:    [id number] Date of Birth: 1955/09/27     Patient Gender: M Patient Age:   10 years Exam Location:  [facility] Procedure:      VAS [place] LOWER EXTREMITY VENOUS (DVT) Referring Phys: Jonny Ruiz DOUTOVA --------------------------------------------------------------------------------  Indications: Pulmonary embolism, and Swelling.  Comparison Study: Prior negative bilateral LEV done 11/04/16, prior negative left                   LEV done 05/19/22 Performing Technologist: Sherren Kerns RVS  Examination Guidelines: A complete evaluation includes B-mode imaging, spectral Doppler, color Doppler, and power Doppler as needed of all accessible portions of each vessel. Bilateral testing is considered an integral part of a complete examination. Limited examinations for reoccurring indications may be performed as noted. The reflux portion of the exam is performed with the patient in reverse Trendelenburg.  +--------+---------------+---------+-----------+----------------+-------------+ RIGHT    CompressibilityPhasicitySpontaneityProperties      Thrombus  Aging         +--------+---------------+---------+-----------+----------------+-------------+ CFV     Full                               pulsatile                                                                waveforms                     +--------+---------------+---------+-----------+----------------+-------------+ SFJ     Full                                                             +--------+---------------+---------+-----------+----------------+-------------+ FV Prox Full                                                             +--------+---------------+---------+-----------+----------------+-------------+ FV Mid  Full                                                             +--------+---------------+---------+-----------+----------------+-------------+ FV      Full                                                             Distal                                                                   +--------+---------------+---------+-----------+----------------+-------------+ PFV     Full                                                             +--------+---------------+---------+-----------+----------------+-------------+ POP     Full                               pulsatile  waveforms                     +--------+---------------+---------+-----------+----------------+-------------+ PTV     Full                                                             +--------+---------------+---------+-----------+----------------+-------------+ PERO    Full                                                             +--------+---------------+---------+-----------+----------------+-------------+    +---------+---------------+---------+-----------+---------------+-------------+ LEFT     CompressibilityPhasicitySpontaneityProperties     Thrombus                                                                 Aging         +---------+---------------+---------+-----------+---------------+-------------+ CFV      Full                               pulsatile                                                                waveforms                    +---------+---------------+---------+-----------+---------------+-------------+ SFJ      Full                                                            +---------+---------------+---------+-----------+---------------+-------------+ FV Prox  Full                                                            +---------+---------------+---------+-----------+---------------+-------------+ FV Mid   Full                                                            +---------+---------------+---------+-----------+---------------+-------------+ FV DistalFull                                                            +---------+---------------+---------+-----------+---------------+-------------+  PFV      Full                                                            +---------+---------------+---------+-----------+---------------+-------------+ POP      Full                               pulsatile                                                                waveforms                    +---------+---------------+---------+-----------+---------------+-------------+ PTV      Full                                                            +---------+---------------+---------+-----------+---------------+-------------+ PERO     Full                                                            +---------+---------------+---------+-----------+---------------+-------------+ Soleal   Full                                                             +---------+---------------+---------+-----------+---------------+-------------+     Summary: BILATERAL: - No evidence of deep vein thrombosis seen in the lower extremities, bilaterally. -No evidence of popliteal cyst, bilaterally.   *See table(s) above for measurements and observations. Electronically signed by Heath Lark on 02/04/2023 at 2:15:34 PM.    Final    ECHOCARDIOGRAM COMPLETE  Result Date: 02/04/2023    ECHOCARDIOGRAM REPORT   Patient Name:   Anthony [name] Date of Exam: 02/04/2023 Medical Rec #:  [id number]    Height:       69.0 in Accession #:    [id number]   Weight:       150.0 lb Date of Birth:  13-Nov-1955    BSA:          1.828 m Patient Age:    67 years     BP:           115/85 mmHg Patient Gender: M            HR:           105 bpm. Exam Location:  Inpatient Procedure: 2D Echo, Cardiac Doppler and Color Doppler Indications:    pulmonary embolus  History:        Patient has prior history of Echocardiogram  examinations, most                 recent 08/28/2022. Arrythmias:Atrial Fibrillation; Risk                 Factors:Hypertension, Dyslipidemia and Alcohol abuse.  Sonographer:    Delcie Roch RDCS Referring Phys: [id number] ANASTASSIA DOUTOVA IMPRESSIONS  1. Left ventricular ejection fraction, by estimation, is 40 to 45%. The left ventricle has mildly decreased function. The left ventricle demonstrates regional wall motion abnormalities (see scoring diagram/findings for description). Left ventricular diastolic function could not be evaluated. There is global LV hypokinesis with akinesis of the basilar to mid inferior wall.  2. Right ventricular systolic function is severely reduced. The right ventricular size is moderately enlarged. Tricuspid regurgitation signal is inadequate for assessing PA pressure.  3. Left atrial size was severely dilated.  4. Right atrial size was severely dilated.  5. The mitral valve is thickened and the posterior leaflet is  restricted there is moderate to severe central MR. The mitral valve is abnormal. Moderate to severe mitral valve regurgitation. No evidence of mitral stenosis.  6. The aortic valve is tricuspid. There is mild calcification of the aortic valve. Aortic valve regurgitation is not visualized. Aortic valve sclerosis/calcification is present, without any evidence of aortic stenosis.  7. The inferior vena cava is dilated in size with <50% respiratory variability, suggesting right atrial pressure of 15 mmHg. FINDINGS  Left Ventricle: Left ventricular ejection fraction, by estimation, is 40 to 45%. The left ventricle has mildly decreased function. The left ventricle demonstrates regional wall motion abnormalities. The left ventricular internal cavity size was normal in size. There is no left ventricular hypertrophy. Left ventricular diastolic function could not be evaluated due to atrial fibrillation. Left ventricular diastolic function could not be evaluated.  LV Wall Scoring: The inferior wall is akinetic. Right Ventricle: The right ventricular size is moderately enlarged. No increase in right ventricular wall thickness. Right ventricular systolic function is severely reduced. Tricuspid regurgitation signal is inadequate for assessing PA pressure. The tricuspid regurgitant velocity is 2.13 m/s, and with an assumed right atrial pressure of 8 mmHg, the estimated right ventricular systolic pressure is 26.1 mmHg. Left Atrium: Left atrial size was severely dilated. Right Atrium: Right atrial size was severely dilated. Pericardium: There is no evidence of pericardial effusion. Mitral Valve: The mitral valve is thickened and the posterior leaflet is restricted there is moderate to severe central MR. The mitral valve is abnormal. There is moderate thickening of the mitral valve leaflet(s). There is moderate calcification of the mitral valve leaflet(s). Moderate to severe mitral valve regurgitation. No evidence of mitral valve  stenosis. Tricuspid Valve: The tricuspid valve is normal in structure. Tricuspid valve regurgitation is not demonstrated. No evidence of tricuspid stenosis. Aortic Valve: The aortic valve is tricuspid. There is mild calcification of the aortic valve. Aortic valve regurgitation is not visualized. Aortic valve sclerosis/calcification is present, without any evidence of aortic stenosis. Pulmonic Valve: The pulmonic valve was normal in structure. Pulmonic valve regurgitation is not visualized. No evidence of pulmonic stenosis. Aorta: The aortic root is normal in size and structure. Venous: The inferior vena cava is dilated in size with less than 50% respiratory variability, suggesting right atrial pressure of 15 mmHg. IAS/Shunts: No atrial level shunt detected by color flow Doppler.  LEFT VENTRICLE PLAX 2D LVIDd:         4.10 cm LVIDs:         3.80 cm LV PW:  0.80 cm LV IVS:        0.70 cm LVOT diam:     2.10 cm LVOT Area:     3.46 cm  RIGHT VENTRICLE          IVC RV Basal diam:  3.40 cm  IVC diam: 2.30 cm TAPSE (M-mode): 1.4 cm LEFT ATRIUM              Index        RIGHT ATRIUM           Index LA diam:        4.40 cm  2.41 cm/m   RA Area:     20.40 cm LA Vol (A2C):   100.0 ml 54.70 ml/m  RA Volume:   58.50 ml  32.00 ml/m LA Vol (A4C):   76.6 ml  41.90 ml/m LA Biplane Vol: 88.6 ml  48.46 ml/m   AORTA Ao Root diam: 3.30 cm Ao Asc diam:  3.30 cm TRICUSPID VALVE TR Peak grad:   18.1 mmHg TR Vmax:        213.00 cm/s  SHUNTS Systemic Diam: 2.10 cm Arvilla Meres MD Electronically signed by Arvilla Meres MD Signature Date/Time: 02/04/2023/10:27:44 AM    Final    CT Angio Chest PE W and/or Wo Contrast  Result Date: 02/03/2023 CLINICAL DATA:  Pulmonary embolism (PE) suspected, high prob; Abdominal pain, acute, nonlocalized. EXAM: CT ANGIOGRAPHY CHEST CT ABDOMEN AND PELVIS WITH CONTRAST TECHNIQUE: Multidetector CT imaging of the chest was performed using the standard protocol during bolus administration of  intravenous contrast. Multiplanar CT image reconstructions and MIPs were obtained to evaluate the vascular anatomy. Multidetector CT imaging of the abdomen and pelvis was performed using the standard protocol during bolus administration of intravenous contrast. RADIATION DOSE REDUCTION: This exam was performed according to the departmental dose-optimization program which includes automated exposure control, adjustment of the mA and/or kV according to patient size and/or use of iterative reconstruction technique. CONTRAST:  75mL OMNIPAQUE IOHEXOL 350 MG/ML SOLN COMPARISON:  Chest CT 01/06/2023.  CT abdomen/pelvis 05/20/2022. FINDINGS: CTA CHEST FINDINGS Cardiovascular: Acute segmental and subsegmental pulmonary emboli in the posterior right lower lobe (for example, coronal image 46 series 8) with associated developing pulmonary infarct in the lateral basal segment (axial image 219 series 6). No evidence of acute right heart strain. Extensive coronary artery calcifications. Mediastinum/Nodes: Postoperative changes of prior hiatal hernia repair. No thoracic lymphadenopathy. Lungs/Pleura: Subpleural ground-glass opacity in the left upper lobe subjacent to an acute fracture of the left lateral third rib (axial image 39 series 7), consistent with pulmonary contusion. No pneumothorax. Mucous plugging and developing atelectasis in the medial basal segment of the right lower lobe. Musculoskeletal: Unchanged comminuted fracture of the left clavicular head and subacute fracture of the left lateral fifth rib. Review of the MIP images confirms the above findings. CT ABDOMEN and PELVIS FINDINGS Hepatobiliary: No focal liver abnormality is seen. No gallstones, gallbladder wall thickening, or biliary dilatation. Pancreas: Unremarkable. No pancreatic ductal dilatation or surrounding inflammatory changes. Spleen: Normal. Adrenals/Urinary Tract: Adrenal glands are unremarkable. Kidneys are normal, without renal calculi, focal lesion,  or hydronephrosis. Bladder is unremarkable. Stomach/Bowel: Normal stomach and duodenum. No dilated loops of small bowel. Appendix is not visualized. Colonic diverticulosis without evidence of acute diverticulitis. Vascular/Lymphatic: Aortic atherosclerosis. No enlarged abdominal or pelvic lymph nodes. Reproductive: Prostate is unremarkable. Other: Diffuse body wall edema. Musculoskeletal: Unchanged mild anterior compression deformities of the L2 through L5 vertebral bodies. Review of the MIP images confirms the  above findings. IMPRESSION: 1. Acute segmental and subsegmental pulmonary emboli in the posterior right lower lobe with associated developing pulmonary infarct in the lateral basal segment. No evidence of acute right heart strain. 2. Acute fracture of the left lateral third rib with associated pulmonary contusion in the left upper lobe. No pneumothorax. 3. Unchanged comminuted fracture of the left clavicular head and subacute fracture of the left lateral fifth rib. 4. No acute findings in the abdomen or pelvis. 5. Colonic diverticulosis without evidence of acute diverticulitis. Aortic Atherosclerosis (ICD10-I70.0). Electronically Signed   By: Orvan Falconer M.D.   On: 02/03/2023 18:31   CT ABDOMEN PELVIS W CONTRAST  Result Date: 02/03/2023 CLINICAL DATA:  Pulmonary embolism (PE) suspected, high prob; Abdominal pain, acute, nonlocalized. EXAM: CT ANGIOGRAPHY CHEST CT ABDOMEN AND PELVIS WITH CONTRAST TECHNIQUE: Multidetector CT imaging of the chest was performed using the standard protocol during bolus administration of intravenous contrast. Multiplanar CT image reconstructions and MIPs were obtained to evaluate the vascular anatomy. Multidetector CT imaging of the abdomen and pelvis was performed using the standard protocol during bolus administration of intravenous contrast. RADIATION DOSE REDUCTION: This exam was performed according to the departmental dose-optimization program which includes automated  exposure control, adjustment of the mA and/or kV according to patient size and/or use of iterative reconstruction technique. CONTRAST:  75mL OMNIPAQUE IOHEXOL 350 MG/ML SOLN COMPARISON:  Chest CT 01/06/2023.  CT abdomen/pelvis 05/20/2022. FINDINGS: CTA CHEST FINDINGS Cardiovascular: Acute segmental and subsegmental pulmonary emboli in the posterior right lower lobe (for example, coronal image 46 series 8) with associated developing pulmonary infarct in the lateral basal segment (axial image 219 series 6). No evidence of acute right heart strain. Extensive coronary artery calcifications. Mediastinum/Nodes: Postoperative changes of prior hiatal hernia repair. No thoracic lymphadenopathy. Lungs/Pleura: Subpleural ground-glass opacity in the left upper lobe subjacent to an acute fracture of the left lateral third rib (axial image 39 series 7), consistent with pulmonary contusion. No pneumothorax. Mucous plugging and developing atelectasis in the medial basal segment of the right lower lobe. Musculoskeletal: Unchanged comminuted fracture of the left clavicular head and subacute fracture of the left lateral fifth rib. Review of the MIP images confirms the above findings. CT ABDOMEN and PELVIS FINDINGS Hepatobiliary: No focal liver abnormality is seen. No gallstones, gallbladder wall thickening, or biliary dilatation. Pancreas: Unremarkable. No pancreatic ductal dilatation or surrounding inflammatory changes. Spleen: Normal. Adrenals/Urinary Tract: Adrenal glands are unremarkable. Kidneys are normal, without renal calculi, focal lesion, or hydronephrosis. Bladder is unremarkable. Stomach/Bowel: Normal stomach and duodenum. No dilated loops of small bowel. Appendix is not visualized. Colonic diverticulosis without evidence of acute diverticulitis. Vascular/Lymphatic: Aortic atherosclerosis. No enlarged abdominal or pelvic lymph nodes. Reproductive: Prostate is unremarkable. Other: Diffuse body wall edema. Musculoskeletal:  Unchanged mild anterior compression deformities of the L2 through L5 vertebral bodies. Review of the MIP images confirms the above findings. IMPRESSION: 1. Acute segmental and subsegmental pulmonary emboli in the posterior right lower lobe with associated developing pulmonary infarct in the lateral basal segment. No evidence of acute right heart strain. 2. Acute fracture of the left lateral third rib with associated pulmonary contusion in the left upper lobe. No pneumothorax. 3. Unchanged comminuted fracture of the left clavicular head and subacute fracture of the left lateral fifth rib. 4. No acute findings in the abdomen or pelvis. 5. Colonic diverticulosis without evidence of acute diverticulitis. Aortic Atherosclerosis (ICD10-I70.0). Electronically Signed   By: Orvan Falconer M.D.   On: 02/03/2023 18:31   CT Head Wo  Contrast  Result Date: 02/03/2023 CLINICAL DATA:  Mental status change EXAM: CT HEAD WITHOUT CONTRAST TECHNIQUE: Contiguous axial images were obtained from the base of the skull through the vertex without intravenous contrast. RADIATION DOSE REDUCTION: This exam was performed according to the departmental dose-optimization program which includes automated exposure control, adjustment of the mA and/or kV according to patient size and/or use of iterative reconstruction technique. COMPARISON:  01/06/2023 FINDINGS: Brain: No evidence of acute infarction, hemorrhage, mass, mass effect, or midline shift. No hydrocephalus or extra-axial fluid collection. Vascular: No hyperdense vessel. Skull: Negative for fracture or focal lesion. Sinuses/Orbits: No acute finding. Other: The mastoid air cells are well aerated. IMPRESSION: No acute intracranial process. Electronically Signed   By: Wiliam Ke M.D.   On: 02/03/2023 18:16   DG Chest Port 1 View  Result Date: 02/03/2023 CLINICAL DATA:  Shortness of breath and chest pain.  Lethargy. EXAM: PORTABLE CHEST 1 VIEW COMPARISON:  12/02/2022 FINDINGS:  Cardiomegaly. Tortuous aorta. Pulmonary venous hypertension, possibly with early interstitial edema. No infiltrate or collapse. No acute bone finding. IMPRESSION: Cardiomegaly. Pulmonary venous hypertension, possibly with early interstitial edema. Electronically Signed   By: Paulina Fusi M.D.   On: 02/03/2023 14:35    Scheduled Meds:  acetaminophen  1,000 mg Oral Q8H   chlordiazePOXIDE  25 mg Oral TID   Followed by   Melene Muller ON 02/06/2023] chlordiazePOXIDE  25 mg Oral BH-qamhs   Followed by   Melene Muller ON 02/07/2023] chlordiazePOXIDE  25 mg Oral Daily   ferrous sulfate  325 mg Oral Q breakfast   folic acid  1 mg Oral Daily   furosemide  40 mg Intravenous BID   guaiFENesin  600 mg Oral BID   lidocaine  3 patch Transdermal Q24H   midodrine  10 mg Oral TID WC   mometasone-formoterol  2 puff Inhalation BID   multivitamin with minerals  1 tablet Oral Daily   polyethylene glycol  17 g Oral BID   rosuvastatin  10 mg Oral Daily   [START ON 02/12/2023] thiamine  100 mg Oral Daily   Continuous Infusions:  sodium chloride 100 mL/hr at 02/05/23 1000   amiodarone 30 mg/hr (02/05/23 1306)   heparin 1,550 Units/hr (02/05/23 0558)   thiamine (VITAMIN B1) injection 500 mg (02/05/23 1305)   Followed by   Melene Muller ON 02/07/2023] thiamine (VITAMIN B1) injection       LOS: 1 day    Time spent: 50 mins    Willeen Niece, MD [facility]   If 7PM-7AM, please contact night-coverage

## 2023-02-05 NOTE — Evaluation (Signed)
Occupational Therapy Evaluation Patient Details Name: Anthony [name] MRN: [id number] DOB: Jan 15, 1956 Today's Date: 02/05/2023   History of Present Illness Anthony [name] is a 67 y.o. male who presents with SOB. CT scans concerning for PE. PMH: alcohol abuse, housing insecurity, PE, depression, HTN, rhabdomyolysis, CVA.   Clinical Impression   At baseline, pt completes ADLs and functional mobility/transfers without an AD Independent to Mod I. Pt now presents with decreased activity tolerance, decreased standing balance during functional tasks, generalized B UE weakness worse in B shoulders, and decreased safety and independence with ADLs and functional transfers/mobility. Pt currently demonstrates ability to complete UB ADLs Independent to Min guard assist, LB ADLs with Min assist, and functional mobility/transfers with a RW with Min assist. Pt currently requires increased time and rest breaks during functional tasks. Pt will benefit from acute skilled OT services to address deficits outlined below and increase safety and independence with ADLs, functional transfers, and functional mobility. Post acute discharge, pt will benefit from intensive inpatient skilled rehab services < 3 hours per day to maximize rehab potential.      Recommendations for follow up therapy are one component of a multi-disciplinary discharge planning process, led by the attending physician.  Recommendations may be updated based on patient status, additional functional criteria and insurance authorization.   Assistance Recommended at Discharge Intermittent Supervision/Assistance  Patient can return home with the following A Spanos help with walking and/or transfers;A Stefanski help with bathing/dressing/bathroom;Assistance with cooking/housework;Assist for transportation;Help with stairs or ramp for entrance    Functional Status Assessment  Patient has had a recent decline in their functional status and demonstrates the ability to  make significant improvements in function in a reasonable and predictable amount of time.  Equipment Recommendations  None recommended by OT    Recommendations for Other Services       Precautions / Restrictions Precautions Precautions: Fall Restrictions Weight Bearing Restrictions: No      Mobility Bed Mobility Overal bed mobility: Modified Independent             General bed mobility comments: use of bed features for supine>sit EOB    Transfers Overall transfer level: Needs assistance Equipment used: Rolling walker (2 wheels) Transfers: Sit to/from Stand Sit to Stand: Min assist                  Balance Overall balance assessment: Needs assistance Sitting-balance support: No upper extremity supported, Feet supported Sitting balance-Leahy Scale: Good     Standing balance support: Bilateral upper extremity supported, During functional activity, Reliant on assistive device for balance Standing balance-Leahy Scale: Fair                             ADL either performed or assessed with clinical judgement   ADL Overall ADL's : Needs assistance/impaired Eating/Feeding: Independent;Sitting   Grooming: Independent;Sitting   Upper Body Bathing: Supervision/ safety;Sitting   Lower Body Bathing: Minimal assistance;Sit to/from stand;Sitting/lateral leans;Cueing for compensatory techniques   Upper Body Dressing : Min guard;Sitting Upper Body Dressing Details (indicate cue type and reason): assist with line management Lower Body Dressing: Min guard;Sit to/from stand Lower Body Dressing Details (indicate cue type and reason): with increased time Toilet Transfer: Minimal assistance;BSC/3in1;Rolling walker (2 wheels) (Step-pivot)   Toileting- Clothing Manipulation and Hygiene: Minimal assistance;Sit to/from stand;Cueing for compensatory techniques;Sitting/lateral lean       Functional mobility during ADLs: Minimal assistance;Rolling walker (2 wheels)  (for short distances  in room. Pt fatigues quickly.) General ADL Comments: Pt with decreased activity tolernace and occasional SOB and increased HR during funcitonal tasks and with pt requiring occasional rest breaks.     Vision Baseline Vision/History: 1 Wears glasses (readers) Ability to See in Adequate Light: 0 Adequate (with glasses) Patient Visual Report: No change from baseline       Perception     Praxis      Pertinent Vitals/Pain Pain Assessment Pain Assessment: No/denies pain     Hand Dominance Right   Extremity/Trunk Assessment Upper Extremity Assessment Upper Extremity Assessment: Generalized weakness   Lower Extremity Assessment Lower Extremity Assessment: Defer to PT evaluation   Cervical / Trunk Assessment Cervical / Trunk Assessment: Normal   Communication Communication Communication: No difficulties   Cognition Arousal/Alertness: Awake/alert Behavior During Therapy: WFL for tasks assessed/performed Overall Cognitive Status: Within Functional Limits for tasks assessed                                 General Comments: AOOx4 and pleasant throughout session. Pt demonstratesability to consistantly follow 1 step directions. Pt noted to have mild short term memory deficits.     General Comments  VSS: HR Walker of 134 bpm with gait, pt in Afib rhythm intermittently. HR back to 90-100 at rest. SpO2 100% on RA at rest and in 90's during mobility. poor read on pleuth at times with desat to 86% as low, pt with mild SOB but no increased WOB noted and no complaints from pt.    Exercises     Shoulder Instructions      Home Living Family/patient expects to be discharged to:: Shelter/Homeless                                 Additional Comments: Pt has been living on the street or at shelter at [facility], he reports housing there is tenuous and not always safe. He is unsure of what he will do at discharge but reports likely will end up back at [facility].  He has no DME available and has one set of clothes.      Prior Functioning/Environment Prior Level of Function : Independent/Modified Independent             Mobility Comments: He reported ambulating without an assistive device, however stated he has used a RW or cane at times if available and needed. ADLs Comments: He reported being independent with ADLs, given housing insecurity and that he has to do for himself.        OT Problem List: Decreased strength;Decreased activity tolerance;Impaired balance (sitting and/or standing);Decreased safety awareness;Cardiopulmonary status limiting activity      OT Treatment/Interventions: Self-care/ADL training;Therapeutic exercise;Energy conservation;DME and/or AE instruction;Therapeutic activities;Patient/family education;Balance training    OT Goals(Current goals can be found in the care plan section) Acute Rehab OT Goals Patient Stated Goal: To be independent and find somewhere stable to live OT Goal Formulation: With patient Time For Goal Achievement: 02/19/23 Potential to Achieve Goals: Good ADL Goals Pt Will Perform Grooming: standing;with supervision Pt Will Perform Lower Body Bathing: with supervision;sit to/from stand Pt Will Perform Lower Body Dressing: with supervision;sit to/from stand Pt Will Transfer to Toilet: with supervision;ambulating;regular height toilet (with least restrictive AD) Pt Will Perform Toileting - Clothing Manipulation and hygiene: with supervision;sit to/from stand  OT Frequency: Min 1X/week    Co-evaluation  AM-PAC OT "6 Clicks" Daily Activity     Outcome Measure Help from another person eating meals?: None Help from another person taking care of personal grooming?: None (in sitting) Help from another person toileting, which includes using toliet, bedpan, or urinal?: A Sevigny Help from another person bathing (including washing, rinsing, drying)?: A Garciamartinez Help from another person to  put on and taking off regular upper body clothing?: A Kalish Help from another person to put on and taking off regular lower body clothing?: A Aitken 6 Click Score: 20   End of Session Equipment Utilized During Treatment: Gait belt;Rolling walker (2 wheels) Nurse Communication: Mobility status  Activity Tolerance: Patient tolerated treatment well;Other (comment) (Pt limited by decreased activity tolerance.) Patient left: in chair;with call bell/phone within reach;with chair alarm set  OT Visit Diagnosis: Unsteadiness on feet (R26.81);Muscle weakness (generalized) (M62.81);Other (comment) (Decreased activity tolerance)                Time: 8469-6295 OT Time Calculation (min): 26 min Charges:  OT General Charges $OT Visit: 1 Visit OT Evaluation $OT Eval Low Complexity: 1 Low  [id number] M., OTR/L, MA Acute Rehab [phone]   Lendon Colonel 02/05/2023, 12:32 PM

## 2023-02-05 NOTE — Plan of Care (Signed)
No changes in the plan, will follow peripherally over the weekend.

## 2023-02-05 NOTE — Progress Notes (Signed)
Initial Nutrition Assessment  DOCUMENTATION CODES:   Not applicable  INTERVENTION:  Continue Folic acid, Thiamine, and Multivitamin w/ minerals daily Liberalize pt to regular per pt request and increased needs.   NUTRITION DIAGNOSIS:   Increased nutrient needs related to chronic illness (CHF) as evidenced by estimated needs.  GOAL:   Patient will meet greater than or equal to 90% of their needs  MONITOR:   PO intake, Labs, Weight trends, I & O's  REASON FOR ASSESSMENT:   Consult Assessment of nutrition requirement/status  ASSESSMENT:   67 y.o. male presented to the ED with hypoglycemia and abdominal pain. PMH includes HTN, A. Fib, EtOH abuse, CHF, stroke, and malnutrition. Pt admitted with pulmonary embolism.  Pt sitting up in chair. Reports that his breakfast looked great this morning, but did not eat as much due to not liking the food. Requesting his diet be changed. Pt reports that he has lost about 30# since his accident and would like to gain some weight back, shares that his UBW was 175-180#. Per weight history, pt had a 7.7 kg weight gain within the past 6 months.   Pt wanting to get up and walk also concerned with itching. RD let RN know and encouraged pt to let MD as well.   Medications reviewed and include: Ferrous Sulfate, Folic acid, Lasix, MVI, Miralax, Thiamine  Labs reviewed: Sodium 124, Potassium 3.6, Phosphorus 3.4, Magnesium 1.3 (02/04/23)   NUTRITION - FOCUSED PHYSICAL EXAM:  Deferred to follow-up.   Diet Order:   Diet Order             Diet regular Room service appropriate? Yes; Fluid consistency: Thin  Diet effective now                   EDUCATION NEEDS:   No education needs have been identified at this time  Skin:  Skin Assessment: Reviewed RN Assessment  Last BM:  7/17  Height:   Ht Readings from Last 1 Encounters:  02/04/23 5\' 9"  (1.753 m)    Weight:   Wt Readings from Last 1 Encounters:  02/04/23 71.9 kg    Ideal Body  Weight:  72.7 kg  BMI:  Body mass index is 23.41 kg/m.  Estimated Nutritional Needs:   Kcal:  1900-2100  Protein:  95-115 grams  Fluid:  >/= 1.9 L   Kirby Crigler RD, LDN Clinical Dietitian See [facility] for contact information.

## 2023-02-05 NOTE — Evaluation (Signed)
Physical Therapy Evaluation Patient Details Name: PERFECTO PURDY MRN: [id number] DOB: Dec 02, 1955 Today's Date: 02/05/2023  History of Present Illness  Amritpal A Chiara is a 67 y.o. male who presents with SOB. CT scans concerning for PE. PMH: alcohol abuse, housing insecurity, PE, depression, HTN, rhabdomyolysis, CVA.   Clinical Impression  Cavin A Scheu is 67 y.o. male admitted with above HPI and diagnosis. Patient is currently limited by functional impairments below (see PT problem list). Patient is independent with occasional use of RW at baseline. Pt currently required Min assist for transfers and gait with RW. HR in Afib rhythm with Cloyd of 134 bpm during gait and recovery to 90's. SpO2 100% at rest on RA and fluctuating between 86%-97% during gait with poor read intermittently. Patient will benefit from continued skilled PT interventions to address impairments and progress independence with mobility. Acute PT will follow and progress as able.         Assistance Recommended at Discharge PRN  If plan is discharge home, recommend the following:  Can travel by private vehicle  A Bankert help with walking and/or transfers;A Havener help with bathing/dressing/bathroom;Assistance with cooking/housework;Assist for transportation;Help with stairs or ramp for entrance        Equipment Recommendations Rollator (4 wheels)  Recommendations for Other Services       Functional Status Assessment Patient has had a recent decline in their functional status and demonstrates the ability to make significant improvements in function in a reasonable and predictable amount of time.     Precautions / Restrictions Precautions Precautions: Fall Restrictions Weight Bearing Restrictions: No      Mobility  Bed Mobility Overal bed mobility: Modified Independent             General bed mobility comments: use of bed features for supine>sit EOB    Transfers Overall transfer level: Needs  assistance Equipment used: Rolling walker (2 wheels) Transfers: Sit to/from Stand Sit to Stand: Min assist           General transfer comment: light assist to power up. pt placing bil UE on RW vs EOB to initiate rise.    Ambulation/Gait Ambulation/Gait assistance: Min assist, Min guard Gait Distance (Feet): 70 Feet Assistive device: Rolling walker (2 wheels) Gait Pattern/deviations: Step-through pattern, Decreased stride length Gait velocity: decr     General Gait Details: pt maintained safe proximity to RW, light assist at start fading to min guard for safety. assist for line management.  Stairs            Wheelchair Mobility     Tilt Bed    Modified Rankin (Stroke Patients Only)       Balance Overall balance assessment: Needs assistance Sitting-balance support: Feet supported Sitting balance-Leahy Scale: Good     Standing balance support: Reliant on assistive device for balance, During functional activity, Bilateral upper extremity supported Standing balance-Leahy Scale: Fair                               Pertinent Vitals/Pain Pain Assessment Pain Assessment: No/denies pain    Home Living Family/patient expects to be discharged to:: Shelter/Homeless                   Additional Comments: Pt has been living on the street or at shelter at [facility], he reports housing there is tenuous and not always safe. He is unsure of what he will do at discharge but reports  likely will end up back at [facility]. He has no DME available and has one set of clothes.    Prior Function Prior Level of Function : Independent/Modified Independent             Mobility Comments: He reported ambulating without an assistive device, however stated he has used a RW or cane at times if available and needed. He reports he is independent with all ADL's as he has to be. ADLs Comments: He reported being independent with ADLs, given housing insecurity and that he has to do  for himself.     Hand Dominance   Dominant Hand: Right    Extremity/Trunk Assessment   Upper Extremity Assessment Upper Extremity Assessment: Defer to OT evaluation    Lower Extremity Assessment Lower Extremity Assessment: Overall WFL for tasks assessed    Cervical / Trunk Assessment Cervical / Trunk Assessment: Normal  Communication   Communication: No difficulties  Cognition Arousal/Alertness: Awake/alert Behavior During Therapy: WFL for tasks assessed/performed Overall Cognitive Status: Within Functional Limits for tasks assessed                                 General Comments: pleasant, agreeable, and receptive to all education.        General Comments General comments (skin integrity, edema, etc.): VSS: HR Keona of 134 bpm with gait, pt in Afib rhythm intermittently. HR back to 90-100 at rest. SpO2 100% on RA at rest and in 90's during mobility. poor read on pleuth at times with desat to 86% as low, pt with mild SOB but no increased WOB noted and no complaints from pt.    Exercises General Exercises - Lower Extremity Ankle Circles/Pumps: AROM, Both, 20 reps   Assessment/Plan    PT Assessment Patient needs continued PT services  PT Problem List Decreased strength;Decreased activity tolerance;Decreased balance;Decreased mobility;Decreased safety awareness;Decreased knowledge of use of DME;Decreased knowledge of precautions;Cardiopulmonary status limiting activity       PT Treatment Interventions DME instruction;Gait training;Stair training;Functional mobility training;Therapeutic activities;Therapeutic exercise;Balance training;Neuromuscular re-education;Cognitive remediation;Patient/family education    PT Goals (Current goals can be found in the Care Plan section)  Acute Rehab PT Goals Patient Stated Goal: get stronger and safe housing PT Goal Formulation: With patient Time For Goal Achievement: 02/19/23 Potential to Achieve Goals: Good     Frequency Min 3X/week     Co-evaluation               AM-PAC PT "6 Clicks" Mobility  Outcome Measure Help needed turning from your back to your side while in a flat bed without using bedrails?: None Help needed moving from lying on your back to sitting on the side of a flat bed without using bedrails?: None Help needed moving to and from a bed to a chair (including a wheelchair)?: A Perlstein Help needed standing up from a chair using your arms (e.g., wheelchair or bedside chair)?: A Trant Help needed to walk in hospital room?: A Piccini Help needed climbing 3-5 steps with a railing? : A Lot 6 Click Score: 19    End of Session Equipment Utilized During Treatment: Gait belt Activity Tolerance: Patient tolerated treatment well Patient left: in chair;with call bell/phone within reach;with chair alarm set Nurse Communication: Mobility status PT Visit Diagnosis: Other abnormalities of gait and mobility (R26.89);Muscle weakness (generalized) (M62.81);Difficulty in walking, not elsewhere classified (R26.2)    Time: 3220-2542 PT Time Calculation (min) (ACUTE ONLY):  25 min   Charges:   PT Evaluation $PT Eval Moderate Complexity: 1 Mod   PT General Charges $$ ACUTE PT VISIT: 1 Visit         Wynn Maudlin, DPT Acute Rehabilitation Services Office [phone]  02/05/23 10:00 AM

## 2023-02-05 NOTE — Progress Notes (Signed)
ANTICOAGULATION CONSULT NOTE - Follow-up Note  Pharmacy Consult for Heparin Indication: pulmonary embolus  Allergies  Allergen Reactions   Other Itching and Other (See Comments)    Seasonal allergies- Itchy eyes, runny nose, congestion   Poison Ivy Extract Rash    Patient Measurements: Height: 5\' 9"  (175.3 cm) Weight: 71.9 kg (158 lb 8.2 oz) IBW/kg (Calculated) : 70.7 Heparin Dosing Weight: 68 kg  Vital Signs: Temp: 98 F (36.7 C) (07/19 0523) Temp Source: Oral (07/19 0523) BP: 112/81 (07/19 0523) Pulse Rate: 84 (07/19 0523)  Labs: Recent Labs    02/03/23 1313 02/03/23 1405 02/03/23 1508 02/03/23 2252 02/04/23 0051 02/04/23 0226 02/04/23 0300 02/04/23 0807 02/04/23 1259 02/04/23 1828 02/05/23 0024  HGB 9.6* 11.6*  --   --  9.6* 10.9*  --   --   --   --   --   HCT 28.6* 34.0*  --   --  28.8* 32.0*  --   --   --   --   --   PLT 216  --   --   --  236  --   --   --   --   --   --   APTT  --   --   --   --  79*  --   --   --   --   --   --   HEPARINUNFRC  --   --   --    < > <0.10*  0.10*  --   --  0.15*  --  0.59 0.58  CREATININE 1.01 1.00  --    < > 0.82  --  0.79  --  0.81 0.75  --   CKTOTAL  --   --   --   --  193  --   --   --   --   --   --   TROPONINIHS 11  --  8  --   --   --   --   --   --   --   --    < > = values in this interval not displayed.    Estimated Creatinine Clearance: 89.6 mL/min (by C-G formula based on SCr of 0.75 mg/dL).   Assessment: 33 yom with a history of Afib, hx of PE 2018. Heparin per pharmacy consult placed for pulmonary embolus.  Patient was not on anticoagulation prior to arrival. Started on 1350 units/hr IV heparin following 2000 unit IV heparin bolus. Resulting heparin level was sub-therapeutic.  Rate increased to 1550 units/hr IV heparin following 4000 unit IV heparin bolus. Resulting heparin level is 0.59 which is therapeutic.  No issues with infusion or bleeding per RN.  Hgb 10.9; plt 236  7/19 AM update: HL  0.58 No issues with infusion or signs of bleeding per RN  Goal of Therapy:  Heparin level 0.3-0.7 units/ml Monitor platelets by anticoagulation protocol: Yes   Plan:  Continue heparin infusion at 1550 units/hr Check heparin level daily while on heparin Continue to monitor H&H and platelets F/u long term anticoagulation plan  Greta Doom BS, PharmD, BCPS Clinical Pharmacist 02/05/2023 5:45 AM  Contact: [phone] after 3 PM  "Be curious, not judgmental..." -Debbora Dus

## 2023-02-06 ENCOUNTER — Inpatient Hospital Stay (HOSPITAL_COMMUNITY): Payer: 59

## 2023-02-06 DIAGNOSIS — I2694 Multiple subsegmental pulmonary emboli without acute cor pulmonale: Secondary | ICD-10-CM | POA: Diagnosis not present

## 2023-02-06 DIAGNOSIS — R0602 Shortness of breath: Secondary | ICD-10-CM

## 2023-02-06 DIAGNOSIS — I4891 Unspecified atrial fibrillation: Secondary | ICD-10-CM | POA: Diagnosis not present

## 2023-02-06 LAB — LACTATE DEHYDROGENASE, PLEURAL OR PERITONEAL FLUID: LD, Fluid: 80 U/L — ABNORMAL HIGH (ref 3–23)

## 2023-02-06 LAB — CBC
HCT: 25.5 % — ABNORMAL LOW (ref 39.0–52.0)
Hemoglobin: 8.5 g/dL — ABNORMAL LOW (ref 13.0–17.0)
MCH: 33.3 pg (ref 26.0–34.0)
MCHC: 33.3 g/dL (ref 30.0–36.0)
MCV: 100 fL (ref 80.0–100.0)
Platelets: 223 10*3/uL (ref 150–400)
RBC: 2.55 MIL/uL — ABNORMAL LOW (ref 4.22–5.81)
RDW: 22.5 % — ABNORMAL HIGH (ref 11.5–15.5)
WBC: 4 10*3/uL (ref 4.0–10.5)
nRBC: 0 % (ref 0.0–0.2)

## 2023-02-06 LAB — GRAM STAIN

## 2023-02-06 LAB — BODY FLUID CELL COUNT WITH DIFFERENTIAL
Eos, Fluid: 0 %
Lymphs, Fluid: 41 %
Monocyte-Macrophage-Serous Fluid: 9 % — ABNORMAL LOW (ref 50–90)
Neutrophil Count, Fluid: 50 % — ABNORMAL HIGH (ref 0–25)
Total Nucleated Cell Count, Fluid: 59 cu mm (ref 0–1000)

## 2023-02-06 LAB — GLUCOSE, PLEURAL OR PERITONEAL FLUID: Glucose, Fluid: 99 mg/dL

## 2023-02-06 LAB — HEMOGLOBIN A1C
Hgb A1c MFr Bld: 5.2 % (ref 4.8–5.6)
Mean Plasma Glucose: 102.54 mg/dL

## 2023-02-06 LAB — TSH: TSH: 2.795 u[IU]/mL (ref 0.350–4.500)

## 2023-02-06 LAB — OSMOLALITY: Osmolality: 269 mOsm/kg — ABNORMAL LOW (ref 275–295)

## 2023-02-06 LAB — PROTEIN, PLEURAL OR PERITONEAL FLUID: Total protein, fluid: <3 g/dL

## 2023-02-06 LAB — BRAIN NATRIURETIC PEPTIDE: B Natriuretic Peptide: 1388.1 pg/mL — ABNORMAL HIGH (ref 0.0–100.0)

## 2023-02-06 LAB — URIC ACID: Uric Acid, Serum: 6.9 mg/dL (ref 3.7–8.6)

## 2023-02-06 LAB — CORTISOL: Cortisol, Plasma: 6.7 ug/dL

## 2023-02-06 LAB — HEPARIN LEVEL (UNFRACTIONATED)
Heparin Unfractionated: 0.33 IU/mL (ref 0.30–0.70)
Heparin Unfractionated: 0.14 IU/mL — ABNORMAL LOW (ref 0.30–0.70)

## 2023-02-06 MED ORDER — SODIUM CHLORIDE 0.9 % IV SOLN: INTRAVENOUS | Status: DC

## 2023-02-06 MED ORDER — MAGNESIUM SULFATE 2 GM/50ML IV SOLN
2.0000 g | Freq: Once | INTRAVENOUS | Status: AC
  Administered 2023-02-06: 2 g via INTRAVENOUS
  Filled 2023-02-06: qty 50

## 2023-02-06 MED ORDER — AMIODARONE HCL 200 MG PO TABS
400.0000 mg | ORAL_TABLET | Freq: Two times a day (BID) | ORAL | Status: DC
  Administered 2023-02-06 – 2023-02-07 (×2): 400 mg via ORAL
  Filled 2023-02-06 (×2): qty 2

## 2023-02-06 MED ORDER — POTASSIUM CHLORIDE CRYS ER 20 MEQ PO TBCR
40.0000 meq | EXTENDED_RELEASE_TABLET | Freq: Once | ORAL | Status: AC
  Administered 2023-02-06: 40 meq via ORAL
  Filled 2023-02-06: qty 2

## 2023-02-06 MED ORDER — PANTOPRAZOLE SODIUM 40 MG PO TBEC
40.0000 mg | DELAYED_RELEASE_TABLET | Freq: Every day | ORAL | Status: DC
  Administered 2023-02-06 – 2023-02-16 (×11): 40 mg via ORAL
  Filled 2023-02-06 (×11): qty 1

## 2023-02-06 MED ORDER — APIXABAN 5 MG PO TABS
10.0000 mg | ORAL_TABLET | Freq: Two times a day (BID) | ORAL | Status: AC
  Administered 2023-02-06 – 2023-02-13 (×13): 10 mg via ORAL
  Filled 2023-02-06 (×14): qty 2

## 2023-02-06 MED ORDER — MORPHINE SULFATE (PF) 2 MG/ML IV SOLN: 2.0000 mg | Freq: Four times a day (QID) | INTRAVENOUS | Status: DC | PRN

## 2023-02-06 MED ORDER — CHLORDIAZEPOXIDE HCL 5 MG PO CAPS
10.0000 mg | ORAL_CAPSULE | Freq: Three times a day (TID) | ORAL | Status: DC
  Administered 2023-02-06 (×3): 10 mg via ORAL
  Filled 2023-02-06 (×3): qty 2

## 2023-02-06 MED ORDER — METOPROLOL TARTRATE 25 MG PO TABS
25.0000 mg | ORAL_TABLET | Freq: Two times a day (BID) | ORAL | Status: DC
  Administered 2023-02-06 – 2023-02-16 (×15): 25 mg via ORAL
  Filled 2023-02-06 (×21): qty 1

## 2023-02-06 MED ORDER — APIXABAN 5 MG PO TABS
5.0000 mg | ORAL_TABLET | Freq: Two times a day (BID) | ORAL | Status: DC
  Administered 2023-02-13 – 2023-02-16 (×6): 5 mg via ORAL
  Filled 2023-02-06 (×6): qty 1

## 2023-02-06 MED ORDER — PROSOURCE PLUS PO LIQD
30.0000 mL | Freq: Two times a day (BID) | ORAL | Status: DC
  Administered 2023-02-06 – 2023-02-15 (×13): 30 mL via ORAL
  Filled 2023-02-06 (×13): qty 30

## 2023-02-06 MED ORDER — FUROSEMIDE 10 MG/ML IJ SOLN
60.0000 mg | Freq: Once | INTRAMUSCULAR | Status: AC
  Administered 2023-02-06: 60 mg via INTRAVENOUS
  Filled 2023-02-06: qty 6

## 2023-02-06 MED ORDER — TRAMADOL HCL 50 MG PO TABS
50.0000 mg | ORAL_TABLET | Freq: Four times a day (QID) | ORAL | Status: DC | PRN
  Administered 2023-02-15: 50 mg via ORAL
  Filled 2023-02-06 (×2): qty 1

## 2023-02-06 NOTE — Progress Notes (Signed)
Assisted Dr Katrinka Blazing with bedside Thoracentesis.  Patient tolerated well Awaiting PCXR

## 2023-02-06 NOTE — Progress Notes (Signed)
PROGRESS NOTE                                                                                                                                                                                                             Patient Demographics:    Anthony [name], is a 67 y.o. male, DOB - [birth year], ZOX:[id number]  Outpatient Primary MD for the patient is Pcp, No    LOS - 2  Admit date - 02/03/2023    Chief Complaint  Patient presents with   Shortness of Breath   Hypoglycemia       Brief Narrative (HPI from H&P)   67 y.o. male with medical history significant of alcohol abuse, anxiety, HTN, homelessness, CVA who presented with chest pain and shortness of breath.  He was found to have a pulmonary embolism as well as atrial fibrillation with RVR and soft blood pressures, hyponatremic with edema, cardiology was consulted and he was admitted to the hospital.  Transferred to my care on 02/06/2023   Subjective:    Anthony [name] today has, No headache, No chest pain, No abdominal pain - No Nausea, No new weakness tingling or numbness, no SOB   Assessment  & Plan :    Acute Pulmonary Embolism with Infarct:  CT PE protocol with acute segmental and subsegmental PE in posterior RLL with associated developing pulmonary infarct in the lateral basal segment, Continue Heparin gtt for now, negative lower extremity venous duplex.  Echo noted with reduction in RV systolic function.  Will eventually transition to Eliquis.   Acute Fracture of L Lateral 3rd Rib with Associated Pulmonary Contusion:  Unchanged Comminuted Fracture of the L Clavicular Head and Subacute Fracture of the L Lateral 5th Rib, supportive care, PT OT, I-S for pulmonary toiletry.  Encouraged to sit in chair in daytime.  Monitor.  Atrial Fibrillation with RVR: Advised to score of at least 4.  Echo noted, discussed with cardiology on 02/06/2023 Dr. Jens Som, TSH stable currently on  amiodarone which will be continued along with heparin.  Acute on chronic systolic left-sided heart failure along with RV systolic dysfunction as well.  Hold further fluids, diuresis as tolerated by blood pressure and monitor.  Blood pressure too low for ACE/ARB, low-dose beta-blocker if tolerated.    New right-sided pleural effusion.  Seen by pulmonary, repeat CT checked on 02/06/2023,  does not look like thorax, pulmonary to do a consult and tap, diurese and monitor.  Hyponatremia  - Has bilateral LE edema on exam, further IV fluids and diurese with Lasix and monitor   Non anion gap metabolic acidosis - Likely contraction alkalosis from intravascular depletion, third spacing of fluids and to some extent lactic acidosis.  Has been adequately hydrated.  Add protein supplements, diurese and monitor.   Etoh Abuse  Drinks 6 pack daily consult to quit, on Librium along with CIWA protocol.   Dyslipidemia  Crestor   Hypomagnesemia   Replaced, Continue to monitor   Chronic hypotension   BP's Raynelle Fanning improving, he is chronically on midodrine which will be continued   Hypoglycemia  Due to poor oral intake, stable random TSH and a.m. cortisol, monitor CBGs.  CBG (last 3)  Recent Labs    02/03/23 2344 02/04/23 0247 02/04/23 0803  GLUCAP 124* 122* 144*         Condition - Extremely Guarded  Family Communication  :  None  Code Status :  Full  Consults  :  PCCM, Cards  PUD Prophylaxis :  PPI   Procedures  :     Echocardiogram.   1. Left ventricular ejection fraction, by estimation, is 40 to 45%. The left ventricle has mildly decreased function. The left ventricle demonstrates regional wall motion abnormalities (see scoring diagram/findings for description). Left ventricular diastolic function could not be evaluated. There is global LV hypokinesis with akinesis of the basilar to mid inferior wall.  2. Right ventricular systolic function is severely reduced. The right ventricular size is  moderately enlarged. Tricuspid regurgitation signal is inadequate for assessing PA pressure.  3. Left atrial size was severely dilated.  4. Right atrial size was severely dilated.  5. The mitral valve is thickened and the posterior leaflet is restricted there is moderate to severe central MR. The mitral valve is abnormal. Moderate to severe mitral valve regurgitation. No evidence of mitral stenosis.  6. The aortic valve is tricuspid. There is mild calcification of the aortic valve. Aortic valve regurgitation is not visualized. Aortic valve sclerosis/calcification is present, without any evidence of aortic stenosis.  7. The inferior vena cava is dilated in size with <50% respiratory variability, suggesting right atrial pressure of 15 mmHg.   CT head.  Nonacute.    CT chest repeat noncontrast on 02/06/2023.  1. New moderate right and small left pleural effusions with associated lower lobe consolidation, right greater than left. 2. Left rib fractures with interval increase in subpleural contusion in the anterolateral left upper lobe. 3. Stable mediastinal adenopathy. 4. New small volume perisplenic fluid. 5. Coronary and aortic Atherosclerosis (ICD10-I70.0).  CTA chest. 02/03/23 -  1. Acute segmental and subsegmental pulmonary emboli in the posterior right lower lobe with associated developing pulmonary infarct in the lateral basal segment. No evidence of acute right heart strain. 2. Acute fracture of the left lateral third rib with associated pulmonary contusion in the left upper lobe. No pneumothorax. 3. Unchanged comminuted fracture of the left clavicular head and subacute fracture of the left lateral fifth rib. 4. No acute findings in the abdomen or pelvis. 5. Colonic diverticulosis without evidence of acute diverticulitis. Aortic Atherosclerosis (ICD10-I70.0)    CT abdomen pelvis.  1. Acute segmental and subsegmental pulmonary emboli in the posterior right lower lobe with associated developing pulmonary  infarct in the lateral basal segment. No evidence of acute right heart strain. 2. Acute fracture of the left lateral third rib with  associated pulmonary contusion in the left upper lobe. No pneumothorax. 3. Unchanged comminuted fracture of the left clavicular head and subacute fracture of the left lateral fifth rib. 4. No acute findings in the abdomen or pelvis. 5. Colonic diverticulosis without evidence of acute diverticulitis. Aortic Atherosclerosis  Lower extremity venous duplex. No DVT      Disposition Plan  :    Status is: Inpatient  DVT Prophylaxis  :  Heparin gtt  Lab Results  Component Value Date   PLT 223 02/06/2023    Diet :  Diet Order             Diet regular Room service appropriate? Yes; Fluid consistency: Thin; Fluid restriction: 1200 mL Fluid  Diet effective now                    Inpatient Medications  Scheduled Meds:  (feeding supplement) PROSource Plus  30 mL Oral BID BM   acetaminophen  1,000 mg Oral Q8H   chlordiazePOXIDE  10 mg Oral TID   ferrous sulfate  325 mg Oral Q breakfast   folic acid  1 mg Oral Daily   furosemide  60 mg Intravenous Once   guaiFENesin  600 mg Oral BID   lidocaine  3 patch Transdermal Q24H   metoprolol tartrate  25 mg Oral BID   midodrine  10 mg Oral TID WC   mometasone-formoterol  2 puff Inhalation BID   multivitamin with minerals  1 tablet Oral Daily   polyethylene glycol  17 g Oral BID   potassium chloride  40 mEq Oral Once   rosuvastatin  10 mg Oral Daily   [START ON 02/12/2023] thiamine  100 mg Oral Daily   Continuous Infusions:  amiodarone 30 mg/hr (02/05/23 2244)   heparin Stopped (02/06/23 0737)   magnesium sulfate bolus IVPB     thiamine (VITAMIN B1) injection 500 mg (02/06/23 0534)   Followed by   Melene Muller ON 02/07/2023] thiamine (VITAMIN B1) injection     PRN Meds:.acetaminophen **FOLLOWED BY** [START ON 02/07/2023] acetaminophen, albuterol, LORazepam **OR** LORazepam, morphine injection, [DISCONTINUED]  ondansetron **OR** ondansetron (ZOFRAN) IV, traMADol  Antibiotics  :    Anti-infectives (From admission, onward)    None         Objective:   Vitals:   02/05/23 1620 02/05/23 2000 02/05/23 2021 02/06/23 0414  BP: 103/84 101/83  109/79  Pulse: 87 87 90 90  Resp: 16 17 (!) 23 19  Temp: (!) 96.1 F (35.6 C) 97.9 F (36.6 C)  97.7 F (36.5 C)  TempSrc: Axillary Oral  Oral  SpO2:   98% 98%  Weight:      Height:        Wt Readings from Last 3 Encounters:  02/04/23 71.9 kg  01/06/23 63.5 kg  12/15/22 67.4 kg     Intake/Output Summary (Last 24 hours) at 02/06/2023 1021 Last data filed at 02/06/2023 0751 Gross per 24 hour  Intake 2447.89 ml  Output 650 ml  Net 1797.89 ml     Physical Exam  Awake Alert, No new F.N deficits, Normal affect [place].AT,PERRAL Supple Neck, No JVD,   Symmetrical Chest wall movement, moderate air movement bilaterally, reduced right lower lobe lung sounds, 1+ leg edema, RRR,No Gallops,Rubs or new Murmurs,  +ve B.Sounds, Abd Soft, No tenderness,   No Cyanosis, Clubbing or edema     Data Review:    Recent Labs  Lab 02/03/23 1313 02/03/23 1405 02/04/23 0051 02/04/23 0226 02/05/23 0744 02/06/23  0326  WBC 4.6  --  5.0  --  5.1 4.0  HGB 9.6* 11.6* 9.6* 10.9* 9.1* 8.5*  HCT 28.6* 34.0* 28.8* 32.0* 27.4* 25.5*  PLT 216  --  236  --  201 223  MCV 101.1*  --  101.8*  --  100.7* 100.0  MCH 33.9  --  33.9  --  33.5 33.3  MCHC 33.6  --  33.3  --  33.2 33.3  RDW 21.3*  --  21.5*  --  22.5* 22.5*  LYMPHSABS 1.3  --   --   --   --   --   MONOABS 0.6  --   --   --   --   --   EOSABS 0.1  --   --   --   --   --   BASOSABS 0.1  --   --   --   --   --     Recent Labs  Lab 02/03/23 1313 02/03/23 1314 02/03/23 1405 02/04/23 0051 02/04/23 0226 02/04/23 0300 02/04/23 1259 02/04/23 1827 02/04/23 1828 02/05/23 0744 02/06/23 0326  NA 123*  --    < > 121* 124* 122* 121*  --  123* 124*  --   K 3.7  --    < > 4.7 5.4* 4.5 4.1  --  3.9 3.6  --    CL 96*  --    < > 93*  --  95* 97*  --  98 100  --   CO2 14*  --    < > 12*  --  14* 15*  --  14* 15*  --   ANIONGAP 13  --    < > 16*  --  13 9  --  11 9  --   GLUCOSE 86  --    < > 133*  --  135* 123*  --  114* 122*  --   BUN 8  --    < > 6*  --  7* 8  --  7* 9  --   CREATININE 1.01  --    < > 0.82  --  0.79 0.81  --  0.75 0.87  --   AST 50*  --   --  49*  45*  --   --   --   --   --   --   --   ALT 23  --   --  23  23  --   --   --   --   --   --   --   ALKPHOS 138*  --   --  140*  137*  --   --   --   --   --   --   --   BILITOT 1.4*  --   --  1.2  0.9  --   --   --   --   --   --   --   ALBUMIN 3.3*  --   --  2.9*  2.8*  --   --   --   --   --   --   --   LATICACIDVEN  --   --   --  3.3*  --   --   --  2.0*  --   --   --   TSH  --   --   --  5.143*  --   --   --   --   --   --  2.795  HGBA1C  --   --   --   --   --   --   --   --   --   --  5.2  AMMONIA  --   --   --  32  --   --   --   --   --   --   --   BNP  --  817.0*  --  1,077.8*  --   --   --   --   --   --  1,388.1*  MG 1.2*  --   --  1.3*  1.3*  --   --   --   --   --   --   --   CALCIUM 8.0*  --    < > 7.5*  --  7.5* 7.0*  --  7.1* 7.0*  --    < > = values in this interval not displayed.      Recent Labs  Lab 02/03/23 1313 02/03/23 1314 02/03/23 2252 02/04/23 0051 02/04/23 0300 02/04/23 1259 02/04/23 1827 02/04/23 1828 02/05/23 0744 02/06/23 0326  LATICACIDVEN  --   --   --  3.3*  --   --  2.0*  --   --   --   TSH  --   --   --  5.143*  --   --   --   --   --  2.795  HGBA1C  --   --   --   --   --   --   --   --   --  5.2  AMMONIA  --   --   --  32  --   --   --   --   --   --   BNP  --  817.0*  --  1,077.8*  --   --   --   --   --  1,388.1*  MG 1.2*  --   --  1.3*  1.3*  --   --   --   --   --   --   CALCIUM 8.0*  --    < > 7.5* 7.5* 7.0*  --  7.1* 7.0*  --    < > = values in this interval not displayed.    Recent Labs  Lab 02/03/23 1313 02/03/23 1405 02/04/23 0051 02/04/23 0300 02/04/23 1259  02/04/23 1827 02/04/23 1828 02/05/23 0744 02/06/23 0326  WBC 4.6  --  5.0  --   --   --   --  5.1 4.0  PLT 216  --  236  --   --   --   --  201 223  LATICACIDVEN  --   --  3.3*  --   --  2.0*  --   --   --   CREATININE 1.01   < > 0.82 0.79 0.81  --  0.75 0.87  --    < > = values in this interval not displayed.    ------------------------------------------------------------------------------------------------------------------ Lab Results  Component Value Date   CHOL 121 12/04/2022   HDL 60 12/04/2022   LDLCALC 48 12/04/2022   TRIG 63 12/04/2022   CHOLHDL 2.0 12/04/2022    Lab Results  Component Value Date   HGBA1C 5.2 02/06/2023    Recent Labs    02/06/23 0326  TSH 2.795   ------------------------------------------------------------------------------------------------------------------ Cardiac Enzymes No results for input(s): "CKMB", "TROPONINI", "MYOGLOBIN" in the last 168 hours.  Invalid  input(s): "CK"  Micro Results Recent Results (from the past 240 hour(s))  Resp panel by RT-PCR (RSV, Flu A&B, Covid) Anterior Nasal Swab     Status: None   Collection Time: 02/03/23  2:39 PM   Specimen: Anterior Nasal Swab  Result Value Ref Range Status   SARS Coronavirus 2 by RT PCR NEGATIVE NEGATIVE Final   Influenza A by PCR NEGATIVE NEGATIVE Final   Influenza B by PCR NEGATIVE NEGATIVE Final    Comment: (NOTE) The Xpert Xpress SARS-CoV-2/FLU/RSV plus assay is intended as an aid in the diagnosis of influenza from Nasopharyngeal swab specimens and should not be used as a sole basis for treatment. Nasal washings and aspirates are unacceptable for Xpert Xpress SARS-CoV-2/FLU/RSV testing.  Fact Sheet for Patients: BloggerCourse.com  Fact Sheet for Healthcare Providers: SeriousBroker.it  This test is not yet approved or cleared by the [place] FDA and has been authorized for detection and/or diagnosis of SARS-CoV-2  by FDA under an Emergency Use Authorization (EUA). This EUA will remain in effect (meaning this test can be used) for the duration of the COVID-19 declaration under Section 564(b)(1) of the Act, 21 U.S.C. section 360bbb-3(b)(1), unless the authorization is terminated or revoked.     Resp Syncytial Virus by PCR NEGATIVE NEGATIVE Final    Comment: (NOTE) Fact Sheet for Patients: BloggerCourse.com  Fact Sheet for Healthcare Providers: SeriousBroker.it  This test is not yet approved or cleared by the [place] FDA and has been authorized for detection and/or diagnosis of SARS-CoV-2 by FDA under an Emergency Use Authorization (EUA). This EUA will remain in effect (meaning this test can be used) for the duration of the COVID-19 declaration under Section 564(b)(1) of the Act, 21 U.S.C. section 360bbb-3(b)(1), unless the authorization is terminated or revoked.  Performed at [facility], [address] [place], [place] [address]     Radiology Reports CT CHEST WO CONTRAST  Result Date: 02/06/2023 CLINICAL DATA:  Pneumonia, complication suspected, xray done EXAM: CT CHEST WITHOUT CONTRAST TECHNIQUE: Multidetector CT imaging of the chest was performed following the standard protocol without IV contrast. RADIATION DOSE REDUCTION: This exam was performed according to the departmental dose-optimization program which includes automated exposure control, adjustment of the mA and/or kV according to patient size and/or use of iterative reconstruction technique. COMPARISON:  CTA 02/03/2023 FINDINGS: Cardiovascular: Coronary and scattered aortic calcifications. Mild four-chamber cardiac enlargement. No pericardial effusion. Mediastinum/Nodes: No mediastinal hematoma or mass. 1.2 cm subcarinal node, previously 1.4 on 01/06/2023. 1.4 cm right paratracheal node, previously 1.3. Additional scattered subcentimeter prominent pre-vascular, and AP  window lymph nodes stable. No definite hilar adenopathy. Postop changes at the GE junction with regional streaky changes in the posterior mediastinal fat, stable since 01/06/2023. Lungs/Pleura: New moderate right pleural effusion. New small left pleural effusion. No pneumothorax. Fairly dense consolidation throughout most of the right lower lobe, and in the posterior dependent aspect of the left lower lobe. Subpleural ground-glass opacities in the anterolateral left upper lobe have slightly increased since previous exam Upper Abdomen: Surgical clips left upper quadrant. Small volume perisplenic fluid, new since prior study. Layering hyperdensity in the gallbladder possibly vicarious excretion of previously administered IV contrast material. Musculoskeletal: Minimally displaced fracture deformities of left ribs 3 and 5 as before. Comminuted left clavicle fracture as before. Bilateral acromioclavicular and glenohumeral degenerative change. IMPRESSION: 1. New moderate right and small left pleural effusions with associated lower lobe consolidation, right greater than left. 2. Left rib fractures with interval increase in subpleural  contusion in the anterolateral left upper lobe. 3. Stable mediastinal adenopathy. 4. New small volume perisplenic fluid. 5. Coronary and aortic Atherosclerosis (ICD10-I70.0). Electronically Signed   By: Corlis Leak M.D.   On: 02/06/2023 09:24   DG Chest Port 1 View  Result Date: 02/06/2023 CLINICAL DATA:  Shortness of breath EXAM: PORTABLE CHEST 1 VIEW COMPARISON:  Abdominal CT from 3 days ago FINDINGS: Sizable right pleural effusion with leftward displacement of the mediastinum. Underlying pulmonary opacity in the setting of pulmonary embolism. Cardiomegaly. No pneumothorax. Chronic fracture at the lateral right clavicle. Prelim sent in epic chat. IMPRESSION: Large right pleural effusion since chest CT 3 days ago. Consider updated CT. Electronically Signed   By: Tiburcio Pea M.D.   On:  02/06/2023 06:58   VAS [place] LOWER EXTREMITY VENOUS (DVT)  Result Date: 02/04/2023  Lower Venous DVT Study Patient Name:  Domanick A Wajda  Date of Exam:   02/04/2023 Medical Rec #: [id number]     Accession #:    [id number] Date of Birth: Apr 25, 1956     Patient Gender: M Patient Age:   36 years Exam Location:  [facility] Procedure:      VAS [place] LOWER EXTREMITY VENOUS (DVT) Referring Phys: Jonny Ruiz DOUTOVA --------------------------------------------------------------------------------  Indications: Pulmonary embolism, and Swelling.  Comparison Study: Prior negative bilateral LEV done 11/04/16, prior negative left                   LEV done 05/19/22 Performing Technologist: Sherren Kerns RVS  Examination Guidelines: A complete evaluation includes B-mode imaging, spectral Doppler, color Doppler, and power Doppler as needed of all accessible portions of each vessel. Bilateral testing is considered an integral part of a complete examination. Limited examinations for reoccurring indications may be performed as noted. The reflux portion of the exam is performed with the patient in reverse Trendelenburg.  +--------+---------------+---------+-----------+----------------+-------------+ RIGHT   CompressibilityPhasicitySpontaneityProperties      Thrombus                                                                 Aging         +--------+---------------+---------+-----------+----------------+-------------+ CFV     Full                               pulsatile                                                                waveforms                     +--------+---------------+---------+-----------+----------------+-------------+ SFJ     Full                                                             +--------+---------------+---------+-----------+----------------+-------------+ FV Prox Full                                                              +--------+---------------+---------+-----------+----------------+-------------+  FV Mid  Full                                                             +--------+---------------+---------+-----------+----------------+-------------+ FV      Full                                                             Distal                                                                   +--------+---------------+---------+-----------+----------------+-------------+ PFV     Full                                                             +--------+---------------+---------+-----------+----------------+-------------+ POP     Full                               pulsatile                                                                waveforms                     +--------+---------------+---------+-----------+----------------+-------------+ PTV     Full                                                             +--------+---------------+---------+-----------+----------------+-------------+ PERO    Full                                                             +--------+---------------+---------+-----------+----------------+-------------+   +---------+---------------+---------+-----------+---------------+-------------+ LEFT     CompressibilityPhasicitySpontaneityProperties     Thrombus  Aging         +---------+---------------+---------+-----------+---------------+-------------+ CFV      Full                               pulsatile                                                                waveforms                    +---------+---------------+---------+-----------+---------------+-------------+ SFJ      Full                                                            +---------+---------------+---------+-----------+---------------+-------------+ FV Prox  Full                                                             +---------+---------------+---------+-----------+---------------+-------------+ FV Mid   Full                                                            +---------+---------------+---------+-----------+---------------+-------------+ FV DistalFull                                                            +---------+---------------+---------+-----------+---------------+-------------+ PFV      Full                                                            +---------+---------------+---------+-----------+---------------+-------------+ POP      Full                               pulsatile                                                                waveforms                    +---------+---------------+---------+-----------+---------------+-------------+ PTV      Full                                                            +---------+---------------+---------+-----------+---------------+-------------+  PERO     Full                                                            +---------+---------------+---------+-----------+---------------+-------------+ Soleal   Full                                                            +---------+---------------+---------+-----------+---------------+-------------+     Summary: BILATERAL: - No evidence of deep vein thrombosis seen in the lower extremities, bilaterally. -No evidence of popliteal cyst, bilaterally.   *See table(s) above for measurements and observations. Electronically signed by Heath Lark on 02/04/2023 at 2:15:34 PM.    Final    ECHOCARDIOGRAM COMPLETE  Result Date: 02/04/2023    ECHOCARDIOGRAM REPORT   Patient Name:   Raidon A Wallen Date of Exam: 02/04/2023 Medical Rec #:  [id number]    Height:       69.0 in Accession #:    [id number]   Weight:       150.0 lb Date of Birth:  February 29, 1956    BSA:          1.828 m Patient Age:    67 years     BP:            115/85 mmHg Patient Gender: M            HR:           105 bpm. Exam Location:  Inpatient Procedure: 2D Echo, Cardiac Doppler and Color Doppler Indications:    pulmonary embolus  History:        Patient has prior history of Echocardiogram examinations, most                 recent 08/28/2022. Arrythmias:Atrial Fibrillation; Risk                 Factors:Hypertension, Dyslipidemia and Alcohol abuse.  Sonographer:    Delcie Roch RDCS Referring Phys: [id number] ANASTASSIA DOUTOVA IMPRESSIONS  1. Left ventricular ejection fraction, by estimation, is 40 to 45%. The left ventricle has mildly decreased function. The left ventricle demonstrates regional wall motion abnormalities (see scoring diagram/findings for description). Left ventricular diastolic function could not be evaluated. There is global LV hypokinesis with akinesis of the basilar to mid inferior wall.  2. Right ventricular systolic function is severely reduced. The right ventricular size is moderately enlarged. Tricuspid regurgitation signal is inadequate for assessing PA pressure.  3. Left atrial size was severely dilated.  4. Right atrial size was severely dilated.  5. The mitral valve is thickened and the posterior leaflet is restricted there is moderate to severe central MR. The mitral valve is abnormal. Moderate to severe mitral valve regurgitation. No evidence of mitral stenosis.  6. The aortic valve is tricuspid. There is mild calcification of the aortic valve. Aortic valve regurgitation is not visualized. Aortic valve sclerosis/calcification is present, without any evidence of aortic stenosis.  7. The inferior vena cava is dilated in size with <50% respiratory variability, suggesting right atrial pressure of 15 mmHg. FINDINGS  Left Ventricle: Left ventricular ejection fraction, by estimation, is 40 to 45%.  The left ventricle has mildly decreased function. The left ventricle demonstrates regional wall motion abnormalities. The left ventricular internal  cavity size was normal in size. There is no left ventricular hypertrophy. Left ventricular diastolic function could not be evaluated due to atrial fibrillation. Left ventricular diastolic function could not be evaluated.  LV Wall Scoring: The inferior wall is akinetic. Right Ventricle: The right ventricular size is moderately enlarged. No increase in right ventricular wall thickness. Right ventricular systolic function is severely reduced. Tricuspid regurgitation signal is inadequate for assessing PA pressure. The tricuspid regurgitant velocity is 2.13 m/s, and with an assumed right atrial pressure of 8 mmHg, the estimated right ventricular systolic pressure is 26.1 mmHg. Left Atrium: Left atrial size was severely dilated. Right Atrium: Right atrial size was severely dilated. Pericardium: There is no evidence of pericardial effusion. Mitral Valve: The mitral valve is thickened and the posterior leaflet is restricted there is moderate to severe central MR. The mitral valve is abnormal. There is moderate thickening of the mitral valve leaflet(s). There is moderate calcification of the mitral valve leaflet(s). Moderate to severe mitral valve regurgitation. No evidence of mitral valve stenosis. Tricuspid Valve: The tricuspid valve is normal in structure. Tricuspid valve regurgitation is not demonstrated. No evidence of tricuspid stenosis. Aortic Valve: The aortic valve is tricuspid. There is mild calcification of the aortic valve. Aortic valve regurgitation is not visualized. Aortic valve sclerosis/calcification is present, without any evidence of aortic stenosis. Pulmonic Valve: The pulmonic valve was normal in structure. Pulmonic valve regurgitation is not visualized. No evidence of pulmonic stenosis. Aorta: The aortic root is normal in size and structure. Venous: The inferior vena cava is dilated in size with less than 50% respiratory variability, suggesting right atrial pressure of 15 mmHg. IAS/Shunts: No atrial  level shunt detected by color flow Doppler.  LEFT VENTRICLE PLAX 2D LVIDd:         4.10 cm LVIDs:         3.80 cm LV PW:         0.80 cm LV IVS:        0.70 cm LVOT diam:     2.10 cm LVOT Area:     3.46 cm  RIGHT VENTRICLE          IVC RV Basal diam:  3.40 cm  IVC diam: 2.30 cm TAPSE (M-mode): 1.4 cm LEFT ATRIUM              Index        RIGHT ATRIUM           Index LA diam:        4.40 cm  2.41 cm/m   RA Area:     20.40 cm LA Vol (A2C):   100.0 ml 54.70 ml/m  RA Volume:   58.50 ml  32.00 ml/m LA Vol (A4C):   76.6 ml  41.90 ml/m LA Biplane Vol: 88.6 ml  48.46 ml/m   AORTA Ao Root diam: 3.30 cm Ao Asc diam:  3.30 cm TRICUSPID VALVE TR Peak grad:   18.1 mmHg TR Vmax:        213.00 cm/s  SHUNTS Systemic Diam: 2.10 cm Arvilla Meres MD Electronically signed by Arvilla Meres MD Signature Date/Time: 02/04/2023/10:27:44 AM    Final    CT Angio Chest PE W and/or Wo Contrast  Result Date: 02/03/2023 CLINICAL DATA:  Pulmonary embolism (PE) suspected, high prob; Abdominal pain, acute, nonlocalized. EXAM: CT ANGIOGRAPHY CHEST CT ABDOMEN AND PELVIS WITH CONTRAST TECHNIQUE:  Multidetector CT imaging of the chest was performed using the standard protocol during bolus administration of intravenous contrast. Multiplanar CT image reconstructions and MIPs were obtained to evaluate the vascular anatomy. Multidetector CT imaging of the abdomen and pelvis was performed using the standard protocol during bolus administration of intravenous contrast. RADIATION DOSE REDUCTION: This exam was performed according to the departmental dose-optimization program which includes automated exposure control, adjustment of the mA and/or kV according to patient size and/or use of iterative reconstruction technique. CONTRAST:  75mL OMNIPAQUE IOHEXOL 350 MG/ML SOLN COMPARISON:  Chest CT 01/06/2023.  CT abdomen/pelvis 05/20/2022. FINDINGS: CTA CHEST FINDINGS Cardiovascular: Acute segmental and subsegmental pulmonary emboli in the posterior  right lower lobe (for example, coronal image 46 series 8) with associated developing pulmonary infarct in the lateral basal segment (axial image 219 series 6). No evidence of acute right heart strain. Extensive coronary artery calcifications. Mediastinum/Nodes: Postoperative changes of prior hiatal hernia repair. No thoracic lymphadenopathy. Lungs/Pleura: Subpleural ground-glass opacity in the left upper lobe subjacent to an acute fracture of the left lateral third rib (axial image 39 series 7), consistent with pulmonary contusion. No pneumothorax. Mucous plugging and developing atelectasis in the medial basal segment of the right lower lobe. Musculoskeletal: Unchanged comminuted fracture of the left clavicular head and subacute fracture of the left lateral fifth rib. Review of the MIP images confirms the above findings. CT ABDOMEN and PELVIS FINDINGS Hepatobiliary: No focal liver abnormality is seen. No gallstones, gallbladder wall thickening, or biliary dilatation. Pancreas: Unremarkable. No pancreatic ductal dilatation or surrounding inflammatory changes. Spleen: Normal. Adrenals/Urinary Tract: Adrenal glands are unremarkable. Kidneys are normal, without renal calculi, focal lesion, or hydronephrosis. Bladder is unremarkable. Stomach/Bowel: Normal stomach and duodenum. No dilated loops of small bowel. Appendix is not visualized. Colonic diverticulosis without evidence of acute diverticulitis. Vascular/Lymphatic: Aortic atherosclerosis. No enlarged abdominal or pelvic lymph nodes. Reproductive: Prostate is unremarkable. Other: Diffuse body wall edema. Musculoskeletal: Unchanged mild anterior compression deformities of the L2 through L5 vertebral bodies. Review of the MIP images confirms the above findings. IMPRESSION: 1. Acute segmental and subsegmental pulmonary emboli in the posterior right lower lobe with associated developing pulmonary infarct in the lateral basal segment. No evidence of acute right heart  strain. 2. Acute fracture of the left lateral third rib with associated pulmonary contusion in the left upper lobe. No pneumothorax. 3. Unchanged comminuted fracture of the left clavicular head and subacute fracture of the left lateral fifth rib. 4. No acute findings in the abdomen or pelvis. 5. Colonic diverticulosis without evidence of acute diverticulitis. Aortic Atherosclerosis (ICD10-I70.0). Electronically Signed   By: Orvan Falconer M.D.   On: 02/03/2023 18:31   CT ABDOMEN PELVIS W CONTRAST  Result Date: 02/03/2023 CLINICAL DATA:  Pulmonary embolism (PE) suspected, high prob; Abdominal pain, acute, nonlocalized. EXAM: CT ANGIOGRAPHY CHEST CT ABDOMEN AND PELVIS WITH CONTRAST TECHNIQUE: Multidetector CT imaging of the chest was performed using the standard protocol during bolus administration of intravenous contrast. Multiplanar CT image reconstructions and MIPs were obtained to evaluate the vascular anatomy. Multidetector CT imaging of the abdomen and pelvis was performed using the standard protocol during bolus administration of intravenous contrast. RADIATION DOSE REDUCTION: This exam was performed according to the departmental dose-optimization program which includes automated exposure control, adjustment of the mA and/or kV according to patient size and/or use of iterative reconstruction technique. CONTRAST:  75mL OMNIPAQUE IOHEXOL 350 MG/ML SOLN COMPARISON:  Chest CT 01/06/2023.  CT abdomen/pelvis 05/20/2022. FINDINGS: CTA CHEST FINDINGS Cardiovascular: Acute segmental and  subsegmental pulmonary emboli in the posterior right lower lobe (for example, coronal image 46 series 8) with associated developing pulmonary infarct in the lateral basal segment (axial image 219 series 6). No evidence of acute right heart strain. Extensive coronary artery calcifications. Mediastinum/Nodes: Postoperative changes of prior hiatal hernia repair. No thoracic lymphadenopathy. Lungs/Pleura: Subpleural ground-glass opacity  in the left upper lobe subjacent to an acute fracture of the left lateral third rib (axial image 39 series 7), consistent with pulmonary contusion. No pneumothorax. Mucous plugging and developing atelectasis in the medial basal segment of the right lower lobe. Musculoskeletal: Unchanged comminuted fracture of the left clavicular head and subacute fracture of the left lateral fifth rib. Review of the MIP images confirms the above findings. CT ABDOMEN and PELVIS FINDINGS Hepatobiliary: No focal liver abnormality is seen. No gallstones, gallbladder wall thickening, or biliary dilatation. Pancreas: Unremarkable. No pancreatic ductal dilatation or surrounding inflammatory changes. Spleen: Normal. Adrenals/Urinary Tract: Adrenal glands are unremarkable. Kidneys are normal, without renal calculi, focal lesion, or hydronephrosis. Bladder is unremarkable. Stomach/Bowel: Normal stomach and duodenum. No dilated loops of small bowel. Appendix is not visualized. Colonic diverticulosis without evidence of acute diverticulitis. Vascular/Lymphatic: Aortic atherosclerosis. No enlarged abdominal or pelvic lymph nodes. Reproductive: Prostate is unremarkable. Other: Diffuse body wall edema. Musculoskeletal: Unchanged mild anterior compression deformities of the L2 through L5 vertebral bodies. Review of the MIP images confirms the above findings. IMPRESSION: 1. Acute segmental and subsegmental pulmonary emboli in the posterior right lower lobe with associated developing pulmonary infarct in the lateral basal segment. No evidence of acute right heart strain. 2. Acute fracture of the left lateral third rib with associated pulmonary contusion in the left upper lobe. No pneumothorax. 3. Unchanged comminuted fracture of the left clavicular head and subacute fracture of the left lateral fifth rib. 4. No acute findings in the abdomen or pelvis. 5. Colonic diverticulosis without evidence of acute diverticulitis. Aortic Atherosclerosis  (ICD10-I70.0). Electronically Signed   By: Orvan Falconer M.D.   On: 02/03/2023 18:31   CT Head Wo Contrast  Result Date: 02/03/2023 CLINICAL DATA:  Mental status change EXAM: CT HEAD WITHOUT CONTRAST TECHNIQUE: Contiguous axial images were obtained from the base of the skull through the vertex without intravenous contrast. RADIATION DOSE REDUCTION: This exam was performed according to the departmental dose-optimization program which includes automated exposure control, adjustment of the mA and/or kV according to patient size and/or use of iterative reconstruction technique. COMPARISON:  01/06/2023 FINDINGS: Brain: No evidence of acute infarction, hemorrhage, mass, mass effect, or midline shift. No hydrocephalus or extra-axial fluid collection. Vascular: No hyperdense vessel. Skull: Negative for fracture or focal lesion. Sinuses/Orbits: No acute finding. Other: The mastoid air cells are well aerated. IMPRESSION: No acute intracranial process. Electronically Signed   By: Wiliam Ke M.D.   On: 02/03/2023 18:16   DG Chest Port 1 View  Result Date: 02/03/2023 CLINICAL DATA:  Shortness of breath and chest pain.  Lethargy. EXAM: PORTABLE CHEST 1 VIEW COMPARISON:  12/02/2022 FINDINGS: Cardiomegaly. Tortuous aorta. Pulmonary venous hypertension, possibly with early interstitial edema. No infiltrate or collapse. No acute bone finding. IMPRESSION: Cardiomegaly. Pulmonary venous hypertension, possibly with early interstitial edema. Electronically Signed   By: Paulina Fusi M.D.   On: 02/03/2023 14:35      Signature  -   Susa Raring M.D on 02/06/2023 at 10:21 AM   -  To page go to [email or web address]

## 2023-02-06 NOTE — Progress Notes (Signed)
  X-cover Note: Pt has ripped out his 6th PIV tonight.  Pt has been on IV amiodarone for 72 hours. RN reports that IV team has no other options for PIV access tonight.    Will change to po amio and po eliquis.  Carollee Herter, DO [facility]

## 2023-02-06 NOTE — Plan of Care (Signed)
  Problem: Education: Goal: Knowledge of General Education information will improve Description: Including pain rating scale, medication(s)/side effects and non-pharmacologic comfort measures Outcome: Progressing   Problem: Health Behavior/Discharge Planning: Goal: Ability to manage health-related needs will improve Outcome: Progressing   Problem: Clinical Measurements: Goal: Ability to maintain clinical measurements within normal limits will improve Outcome: Progressing Goal: Will remain free from infection Outcome: Progressing Goal: Diagnostic test results will improve Outcome: Progressing Goal: Respiratory complications will improve Outcome: Progressing Goal: Cardiovascular complication will be avoided Outcome: Progressing   Problem: Activity: Goal: Risk for activity intolerance will decrease Outcome: Progressing   Problem: Nutrition: Goal: Adequate nutrition will be maintained Outcome: Progressing   Problem: Coping: Goal: Level of anxiety will decrease Outcome: Progressing   Problem: Elimination: Goal: Will not experience complications related to bowel motility Outcome: Progressing Goal: Will not experience complications related to urinary retention Outcome: Progressing   Problem: Pain Managment: Goal: General experience of comfort will improve Outcome: Progressing   Problem: Safety: Goal: Ability to remain free from injury will improve Outcome: Progressing   Problem: Skin Integrity: Goal: Risk for impaired skin integrity will decrease Outcome: Progressing   Problem: Education: Goal: Knowledge of General Education information will improve Description: Including pain rating scale, medication(s)/side effects and non-pharmacologic comfort measures Outcome: Progressing   Problem: Health Behavior/Discharge Planning: Goal: Ability to manage health-related needs will improve Outcome: Progressing   Problem: Clinical Measurements: Goal: Ability to maintain  clinical measurements within normal limits will improve Outcome: Progressing   Problem: Clinical Measurements: Goal: Will remain free from infection Outcome: Progressing   Problem: Clinical Measurements: Goal: Diagnostic test results will improve Outcome: Progressing   Problem: Clinical Measurements: Goal: Respiratory complications will improve Outcome: Progressing   Problem: Clinical Measurements: Goal: Cardiovascular complication will be avoided Outcome: Progressing   Problem: Activity: Goal: Risk for activity intolerance will decrease Outcome: Progressing   Problem: Nutrition: Goal: Adequate nutrition will be maintained Outcome: Progressing   Problem: Coping: Goal: Level of anxiety will decrease Outcome: Progressing   Problem: Elimination: Goal: Will not experience complications related to bowel motility Outcome: Progressing   Problem: Safety: Goal: Ability to remain free from injury will improve Outcome: Progressing   Problem: Pain Managment: Goal: General experience of comfort will improve Outcome: Progressing   Problem: Skin Integrity: Goal: Risk for impaired skin integrity will decrease Outcome: Progressing   Problem: Skin Integrity: Goal: Risk for impaired skin integrity will decrease Outcome: Progressing

## 2023-02-06 NOTE — Progress Notes (Signed)
ANTICOAGULATION CONSULT NOTE - Follow-up Note  Pharmacy Consult for Heparin Indication: pulmonary embolus  Allergies  Allergen Reactions   Other Itching and Other (See Comments)    Seasonal allergies- Itchy eyes, runny nose, congestion   Poison Ivy Extract Rash    Patient Measurements: Height: 5\' 9"  (175.3 cm) Weight: 71.9 kg (158 lb 8.2 oz) IBW/kg (Calculated) : 70.7 Heparin Dosing Weight: 68 kg  Vital Signs: Temp: 97.5 F (36.4 C) (07/20 1600) Temp Source: Oral (07/20 1600) BP: 93/57 (07/20 1600)  Labs: Recent Labs    02/04/23 0051 02/04/23 0226 02/04/23 0300 02/04/23 1259 02/04/23 1828 02/05/23 0024 02/05/23 0744 02/06/23 0326 02/06/23 1812  HGB 9.6* 10.9*  --   --   --   --  9.1* 8.5*  --   HCT 28.8* 32.0*  --   --   --   --  27.4* 25.5*  --   PLT 236  --   --   --   --   --  201 223  --   APTT 79*  --   --   --   --   --   --   --   --   HEPARINUNFRC <0.10*  0.10*  --    < >  --  0.59   < > 0.46 0.33 0.14*  CREATININE 0.82  --    < > 0.81 0.75  --  0.87  --   --   CKTOTAL 193  --   --   --   --   --   --   --   --    < > = values in this interval not displayed.    Estimated Creatinine Clearance: 82.4 mL/min (by C-G formula based on SCr of 0.87 mg/dL).   Assessment: 74 yom with a history of Afib, hx of PE 2018. Heparin per pharmacy consult placed for pulmonary embolus.  Heparin level 0.14 - per d/w RN, heparin drip was off due to patient pulling out IV. New IV site established and heparin drip resuming at 1550 units/hr.  Goal of Therapy:  Heparin level 0.3-0.7 units/ml Monitor platelets by anticoagulation protocol: Yes   Plan:  Heparin at 1550 units/hr resumed ~2100 per RN F/u heparin level in AM at 0400 Continue to monitor H&H and platelets F/u long term anticoagulation plan  Loralee Pacas, PharmD, BCPS 02/06/2023 8:56 PM  Please check AMION for all [facility] phone numbers After 10:00 PM, call Main Pharmacy [phone]

## 2023-02-06 NOTE — Plan of Care (Signed)
  Problem: Education: Goal: Knowledge of General Education information will improve Description: Including pain rating scale, medication(s)/side effects and non-pharmacologic comfort measures Outcome: Progressing   Problem: Health Behavior/Discharge Planning: Goal: Ability to manage health-related needs will improve Outcome: Progressing   Problem: Clinical Measurements: Goal: Ability to maintain clinical measurements within normal limits will improve Outcome: Progressing Goal: Will remain free from infection Outcome: Progressing Goal: Diagnostic test results will improve Outcome: Progressing Goal: Respiratory complications will improve Outcome: Progressing Goal: Cardiovascular complication will be avoided Outcome: Progressing   Problem: Activity: Goal: Risk for activity intolerance will decrease Outcome: Progressing   Problem: Nutrition: Goal: Adequate nutrition will be maintained Outcome: Progressing   Problem: Coping: Goal: Level of anxiety will decrease Outcome: Progressing   Problem: Elimination: Goal: Will not experience complications related to bowel motility Outcome: Progressing Goal: Will not experience complications related to urinary retention Outcome: Progressing   Problem: Pain Managment: Goal: General experience of comfort will improve Outcome: Progressing   Problem: Safety: Goal: Ability to remain free from injury will improve Outcome: Progressing   Problem: Skin Integrity: Goal: Risk for impaired skin integrity will decrease Outcome: Progressing   Problem: Health Behavior/Discharge Planning: Goal: Ability to manage health-related needs will improve Outcome: Progressing   Problem: Clinical Measurements: Goal: Ability to maintain clinical measurements within normal limits will improve Outcome: Progressing   Problem: Clinical Measurements: Goal: Will remain free from infection Outcome: Progressing   Problem: Clinical Measurements: Goal:  Diagnostic test results will improve Outcome: Progressing   Problem: Clinical Measurements: Goal: Respiratory complications will improve Outcome: Progressing   Problem: Clinical Measurements: Goal: Cardiovascular complication will be avoided Outcome: Progressing   Problem: Clinical Measurements: Goal: Respiratory complications will improve Outcome: Progressing   Problem: Coping: Goal: Level of anxiety will decrease Outcome: Progressing   Problem: Skin Integrity: Goal: Risk for impaired skin integrity will decrease Outcome: Progressing   Problem: Safety: Goal: Ability to remain free from injury will improve Outcome: Progressing

## 2023-02-06 NOTE — Procedures (Signed)
Thoracentesis  Procedure Note  Anthony [name]  [id number]  07-20-1956  Date:02/06/23  Time:3:06 PM   Provider Performing:Terril Chestnut C Lai Hendriks   Procedure: Thoracentesis with imaging guidance (28413)  Indication(s) Pleural Effusion  Consent Risks of the procedure as well as the alternatives and risks of each were explained to the patient and/or caregiver.  Consent for the procedure was obtained and is signed in the bedside chart  Anesthesia Topical only with 1% lidocaine    Time Out Verified patient identification, verified procedure, site/side was marked, verified correct patient position, special equipment/implants available, medications/allergies/relevant history reviewed, required imaging and test results available.   Sterile Technique Maximal sterile technique including full sterile barrier drape, hand hygiene, sterile gown, sterile gloves, mask, hair covering, sterile ultrasound probe cover (if used).  Procedure Description Ultrasound was used to identify appropriate pleural anatomy for placement and overlying skin marked.  Area of drainage cleaned and draped in sterile fashion. Lidocaine was used to anesthetize the skin and subcutaneous tissue.  800 cc's of straw appearing fluid was drained from the right pleural space. Catheter then removed and bandaid applied to site.   Complications/Tolerance None; patient tolerated the procedure well. Chest X-ray is ordered to confirm no post-procedural complication.   EBL Minimal   Specimen(s) Pleural fluid

## 2023-02-06 NOTE — Consult Note (Signed)
NAME:  Anthony [name], MRN:  [id number], DOB:  1955/09/28, LOS: 2 ADMISSION DATE:  02/03/2023, CONSULTATION DATE:  02/06/23 REFERRING MD:  Thedore Mins, CHIEF COMPLAINT:  SOB   History of Present Illness:  67 year old man w/ hx of chronic alcohol dependence, noncompliance and myriad of heart issues presenting with DOE, LE edema.  +cough, yellow which is normal for him.  ROS as below.  PCCM consulted as breathing got worse this am and he has developed fair size R pleural effusion.  There is some concern this could be hemothorax as he has been on heparin.  Pertinent  Medical History  EtOH abuse Afib CVA HTN HLD PE  Significant Hospital Events: Including procedures, antibiotic start and stop dates in addition to other pertinent events     Interim History / Subjective:  consult  Objective   Blood pressure 111/84, pulse 90, temperature 97.9 F (36.6 C), temperature source Oral, resp. rate 16, height 5\' 9"  (1.753 m), weight 71.9 kg, SpO2 97%.        Intake/Output Summary (Last 24 hours) at 02/06/2023 1153 Last data filed at 02/06/2023 1144 Gross per 24 hour  Intake 2913.88 ml  Output 650 ml  Net 2263.88 ml   Filed Weights   02/03/23 1900 02/04/23 1537  Weight: 68 kg 71.9 kg    Examination: General: chronically ill HENT: mmm, trachea midline Lungs: reduced bases, moderate R effusion on [place] Cardiovascular: irregular, ext warm Abdomen: soft, +BS Extremities: +LE edema Neuro: Globally weak but nonfocal Skin: slightly pale, age-related changes  CXR, CT reviewed Hyponatremia reviewed  Resolved Hospital Problem list   N/A  Assessment & Plan:  Pleural effusion- likely just related to volume overload; will tap to help breathing and make sure nothing else going on; would push diuresis.  Will f/u on pleural fluid studies and reach out if anything needs to be chased. Discussed with primary, available PRN  Best Practice (right click and "Reselect all SmartList Selections" daily)  Per  primary  Labs   CBC: Recent Labs  Lab 02/03/23 1313 02/03/23 1405 02/04/23 0051 02/04/23 0226 02/05/23 0744 02/06/23 0326  WBC 4.6  --  5.0  --  5.1 4.0  NEUTROABS 2.6  --   --   --   --   --   HGB 9.6* 11.6* 9.6* 10.9* 9.1* 8.5*  HCT 28.6* 34.0* 28.8* 32.0* 27.4* 25.5*  MCV 101.1*  --  101.8*  --  100.7* 100.0  PLT 216  --  236  --  201 223    Basic Metabolic Panel: Recent Labs  Lab 02/03/23 1313 02/03/23 1405 02/04/23 0051 02/04/23 0226 02/04/23 0300 02/04/23 1259 02/04/23 1828 02/05/23 0744  NA 123*   < > 121* 124* 122* 121* 123* 124*  K 3.7   < > 4.7 5.4* 4.5 4.1 3.9 3.6  CL 96*   < > 93*  --  95* 97* 98 100  CO2 14*   < > 12*  --  14* 15* 14* 15*  GLUCOSE 86   < > 133*  --  135* 123* 114* 122*  BUN 8   < > 6*  --  7* 8 7* 9  CREATININE 1.01   < > 0.82  --  0.79 0.81 0.75 0.87  CALCIUM 8.0*   < > 7.5*  --  7.5* 7.0* 7.1* 7.0*  MG 1.2*  --  1.3*  1.3*  --   --   --   --   --  PHOS  --   --  3.4  3.3  --   --   --   --   --    < > = values in this interval not displayed.   GFR: Estimated Creatinine Clearance: 82.4 mL/min (by C-G formula based on SCr of 0.87 mg/dL). Recent Labs  Lab 02/03/23 1313 02/04/23 0051 02/04/23 1827 02/05/23 0744 02/06/23 0326  WBC 4.6 5.0  --  5.1 4.0  LATICACIDVEN  --  3.3* 2.0*  --   --     Liver Function Tests: Recent Labs  Lab 02/03/23 1313 02/04/23 0051  AST 50* 49*  45*  ALT 23 23  23   ALKPHOS 138* 140*  137*  BILITOT 1.4* 1.2  0.9  PROT 7.1 6.4*  6.4*  ALBUMIN 3.3* 2.9*  2.8*   No results for input(s): "LIPASE", "AMYLASE" in the last 168 hours. Recent Labs  Lab 02/04/23 0051  AMMONIA 32    ABG    Component Value Date/Time   PHART 7.52 (H) 05/25/2022 1243   PCO2ART 32 05/25/2022 1243   PO2ART 72 (L) 05/25/2022 1243   HCO3 15.1 (L) 02/04/2023 1826   TCO2 14 (L) 02/04/2023 0226   ACIDBASEDEF 9.8 (H) 02/04/2023 1826   O2SAT 79.4 02/04/2023 1826     Coagulation Profile: No results for  input(s): "INR", "PROTIME" in the last 168 hours.  Cardiac Enzymes: Recent Labs  Lab 02/04/23 0051  CKTOTAL 193    HbA1C: Hgb A1c MFr Bld  Date/Time Value Ref Range Status  02/06/2023 03:26 AM 5.2 4.8 - 5.6 % Final    Comment:    (NOTE) Pre diabetes:          5.7%-6.4%  Diabetes:              >6.4%  Glycemic control for   <7.0% adults with diabetes   12/04/2022 04:06 AM 4.9 4.8 - 5.6 % Final    Comment:    (NOTE) Pre diabetes:          5.7%-6.4%  Diabetes:              >6.4%  Glycemic control for   <7.0% adults with diabetes     CBG: Recent Labs  Lab 02/03/23 1549 02/03/23 1813 02/03/23 2344 02/04/23 0247 02/04/23 0803  GLUCAP 177* 108* 124* 122* 144*    Review of Systems:    Positive Symptoms in bold:  Constitutional fevers, chills, weight loss, fatigue, anorexia, malaise  Eyes decreased vision, double vision, eye irritation  Ears, Nose, Mouth, Throat sore throat, trouble swallowing, sinus congestion  Cardiovascular chest pain, paroxysmal nocturnal dyspnea, lower ext edema, palpitations   Respiratory SOB, cough, DOE, hemoptysis, wheezing  Gastrointestinal nausea, vomiting, diarrhea  Genitourinary burning with urination, trouble urinating  Musculoskeletal joint aches, joint swelling, back pain  Integumentary  rashes, skin lesions  Neurological focal weakness, focal numbness, trouble speaking, headaches  Psychiatric depression, anxiety, confusion  Endocrine polyuria, polydipsia, cold intolerance, heat intolerance  Hematologic abnormal bruising, abnormal bleeding, unexplained nose bleeds  Allergic/Immunologic recurrent infections, hives, swollen lymph nodes     Past Medical History:  He,  has a past medical history of Actinic keratosis (11/27/2016), Alcohol abuse with alcohol-induced mood disorder (HCC) (07/18/2018), Alcohol use disorder, severe, dependence (HCC) (09/16/2006), Alcoholism (HCC), Anxiety state (04/25/2018), Aortic atherosclerosis (HCC)  (08/27/2022), Chronic low back pain (09/16/2006), Delirium tremens (HCC) (09/01/2017), Depression, Dry eye (11/23/2016), Foreign body in middle portion of esophagus (05/19/2022), Hiatal hernia (09/14/2016), History of delirium tremons (07/22/2020), History  of pulmonary embolus (PE) (11/02/2016), Hydrocele, Hypertension, Hypoalbuminemia due to protein-calorie malnutrition (HCC) (07/22/2020), Lumbar compression fracture (HCC), Multiple rib fractures (07/22/2019), Pancytopenia (HCC), Rhabdomyolysis (07/22/2020), Skin cancer, Tinea versicolor (06/08/2013), Tobacco use disorder (07/22/2020), Tooth infection (04/25/2018), Trigger finger, acquired (11/18/2012), and Ulnar tunnel syndrome of right wrist (11/08/2012).   Surgical History:   Past Surgical History:  Procedure Laterality Date   BIOPSY  05/21/2022   Procedure: BIOPSY;  Surgeon: Lynann Bologna, MD;  Location: [facility] ENDOSCOPY;  Service: Gastroenterology;;   CATARACT EXTRACTION     ESOPHAGOGASTRODUODENOSCOPY (EGD) WITH PROPOFOL N/A 05/21/2022   Procedure: ESOPHAGOGASTRODUODENOSCOPY (EGD) WITH PROPOFOL;  Surgeon: Lynann Bologna, MD;  Location: [facility] ENDOSCOPY;  Service: Gastroenterology;  Laterality: N/A;   FOREIGN BODY REMOVAL N/A 05/21/2022   Procedure: FOREIGN BODY REMOVAL;  Surgeon: Lynann Bologna, MD;  Location: [facility] ENDOSCOPY;  Service: Gastroenterology;  Laterality: N/A;   HIATAL HERNIA REPAIR  2016   HYDROCELE EXCISION / REPAIR  2010   SKIN CANCER EXCISION Left    Excised 2017     Social History:   reports that he has never smoked. His smokeless tobacco use includes snuff. He reports current alcohol use of about 43.0 standard drinks of alcohol per week. He reports current drug use. Drug: Marijuana.   Family History:  His family history includes Cancer in his sister; Dementia in his father; Heart attack in his father; Stroke in his father. There is no history of Colon cancer, Colon polyps, Esophageal cancer, Stomach cancer, or Rectal cancer.    Allergies Allergies  Allergen Reactions   Other Itching and Other (See Comments)    Seasonal allergies- Itchy eyes, runny nose, congestion   Poison Ivy Extract Rash     Home Medications  Prior to Admission medications   Medication Sig Start Date End Date Taking? Authorizing Provider  acetaminophen (TYLENOL) 500 MG tablet Take 1 tablet (500 mg total) by mouth every 8 (eight) hours as needed. Patient taking differently: Take 1,000 mg by mouth every 6 (six) hours as needed (for pain). 10/13/22  Yes Leatha Gilding, MD  amiodarone (PACERONE) 200 MG tablet Take 1 tablet (200 mg total) by mouth daily. 12/27/22   Regalado, Belkys A, MD  amiodarone (PACERONE) 200 MG tablet Take 2 tablets (400 mg total) by mouth 2 (two) times daily for 10 days. Patient not taking: Reported on 02/03/2023 12/18/22 12/28/22  Regalado, Jon Billings A, MD  diltiazem (CARDIZEM CD) 120 MG 24 hr capsule Take 1 capsule (120 mg total) by mouth daily. Patient not taking: Reported on 02/03/2023 12/19/22   Hartley Barefoot A, MD  ferrous sulfate 325 (65 FE) MG tablet Take 1 tablet (325 mg total) by mouth daily with breakfast. 12/18/22 01/17/23  Regalado, Jon Billings A, MD  folic acid (FOLVITE) 1 MG tablet Take 1 tablet (1 mg total) by mouth daily. Patient not taking: Reported on 02/03/2023 12/18/22   Regalado, Jon Billings A, MD  loratadine (CLARITIN) 10 MG tablet Take 1 tablet (10 mg total) by mouth daily. Patient not taking: Reported on 02/03/2023 12/19/22   Regalado, Jon Billings A, MD  magnesium oxide (MAG-OX) 400 (240 Mg) MG tablet Take 1 tablet (400 mg total) by mouth 2 (two) times daily. Patient not taking: Reported on 02/03/2023 12/18/22   Regalado, Jon Billings A, MD  methocarbamol (ROBAXIN) 500 MG tablet Take 1 tablet (500 mg total) by mouth every 6 (six) hours as needed for muscle spasms. Patient not taking: Reported on 02/03/2023 12/18/22   Hartley Barefoot A, MD  metoprolol succinate (TOPROL-XL) 25 MG  24 hr tablet Take 1 tablet (25 mg total) by mouth 2  (two) times daily. Patient not taking: Reported on 02/03/2023 12/18/22 03/18/23  Regalado, Jon Billings A, MD  midodrine (PROAMATINE) 10 MG tablet Take 1 tablet (10 mg total) by mouth 3 (three) times daily with meals. Patient not taking: Reported on 02/03/2023 12/18/22   Regalado, Jon Billings A, MD  mirtazapine (REMERON) 15 MG tablet Take 1 tablet (15 mg total) by mouth at bedtime. Patient not taking: Reported on 02/03/2023 12/18/22   Regalado, Jon Billings A, MD  mometasone-formoterol (DULERA) 200-5 MCG/ACT AERO Inhale 2 puffs into the lungs 2 (two) times daily. Patient not taking: Reported on 02/03/2023 12/18/22   Regalado, Jon Billings A, MD  Multiple Vitamins-Minerals (CENTRUM SILVER 50+MEN) TABS Take 1 tablet by mouth every other day. Patient not taking: Reported on 02/03/2023 12/18/22   Regalado, Jon Billings A, MD  Nutritional Supplements (ENSURE HIGH PROTEIN) LIQD Take 237 mLs by mouth daily. Patient not taking: Reported on 02/03/2023    [provider]  rivaroxaban (XARELTO) 20 MG TABS tablet Take 1 tablet (20 mg total) by mouth daily with supper. Patient not taking: Reported on 02/03/2023 12/18/22   Regalado, Jon Billings A, MD  rosuvastatin (CRESTOR) 10 MG tablet Take 1 tablet (10 mg total) by mouth daily. Patient not taking: Reported on 02/03/2023 12/18/22   Alba Cory, MD     Critical care time: N/A

## 2023-02-06 NOTE — Progress Notes (Addendum)
ANTICOAGULATION CONSULT NOTE - Follow-up Note  Pharmacy Consult for Heparin Indication: pulmonary embolus  Allergies  Allergen Reactions   Other Itching and Other (See Comments)    Seasonal allergies- Itchy eyes, runny nose, congestion   Poison Ivy Extract Rash    Patient Measurements: Height: 5\' 9"  (175.3 cm) Weight: 71.9 kg (158 lb 8.2 oz) IBW/kg (Calculated) : 70.7 Heparin Dosing Weight: 68 kg  Vital Signs: Temp: 97.7 F (36.5 C) (07/20 0414) Temp Source: Oral (07/20 0414) BP: 109/79 (07/20 0414) Pulse Rate: 90 (07/20 0414)  Labs: Recent Labs    02/03/23 1313 02/03/23 1405 02/03/23 1508 02/03/23 2252 02/04/23 0051 02/04/23 0226 02/04/23 0300 02/04/23 1259 02/04/23 1828 02/05/23 0024 02/05/23 0744 02/06/23 0326  HGB 9.6*   < >  --   --  9.6* 10.9*  --   --   --   --  9.1* 8.5*  HCT 28.6*   < >  --   --  28.8* 32.0*  --   --   --   --  27.4* 25.5*  PLT 216  --   --   --  236  --   --   --   --   --  201 223  APTT  --   --   --   --  79*  --   --   --   --   --   --   --   HEPARINUNFRC  --   --   --   --  <0.10*  0.10*  --    < >  --  0.59 0.58 0.46 0.33  CREATININE 1.01   < >  --    < > 0.82  --    < > 0.81 0.75  --  0.87  --   CKTOTAL  --   --   --   --  193  --   --   --   --   --   --   --   TROPONINIHS 11  --  8  --   --   --   --   --   --   --   --   --    < > = values in this interval not displayed.    Estimated Creatinine Clearance: 82.4 mL/min (by C-G formula based on SCr of 0.87 mg/dL).   Assessment: 64 yom with a history of Afib, hx of PE 2018. Heparin per pharmacy consult placed for pulmonary embolus.  Patient was not on anticoagulation prior to arrival. Started on 1350 units/hr IV heparin following 2000 unit IV heparin bolus. Resulting heparin level was sub-therapeutic.  Rate increased to 1550 units/hr IV heparin following 4000 unit IV heparin bolus. Resulting heparin level is 0.59 which is therapeutic.  No issues with infusion or bleeding  per RN.  Hgb 10.9; plt 236  CT does not show bleeding so okay to resume heparin.  Goal of Therapy:  Heparin level 0.3-0.7 units/ml Monitor platelets by anticoagulation protocol: Yes   Plan:  Resume Heparin at 1550 units/hr IV  Continue to monitor HL Continue to monitor H&H and platelets F/u long term anticoagulation plan  Gwynn Burly, PharmD 02/06/2023 9:00 AM  Contact: [phone] after 3 PM

## 2023-02-07 ENCOUNTER — Inpatient Hospital Stay (HOSPITAL_COMMUNITY): Payer: 59

## 2023-02-07 DIAGNOSIS — I2694 Multiple subsegmental pulmonary emboli without acute cor pulmonale: Secondary | ICD-10-CM | POA: Diagnosis not present

## 2023-02-07 LAB — CBC WITH DIFFERENTIAL/PLATELET
Abs Immature Granulocytes: 0.02 10*3/uL (ref 0.00–0.07)
Basophils Absolute: 0 10*3/uL (ref 0.0–0.1)
Basophils Relative: 1 %
Eosinophils Absolute: 0 10*3/uL (ref 0.0–0.5)
Eosinophils Relative: 1 %
HCT: 26.5 % — ABNORMAL LOW (ref 39.0–52.0)
Hemoglobin: 8.8 g/dL — ABNORMAL LOW (ref 13.0–17.0)
Immature Granulocytes: 0 %
Lymphocytes Relative: 30 %
Lymphs Abs: 1.3 10*3/uL (ref 0.7–4.0)
MCH: 33.1 pg (ref 26.0–34.0)
MCHC: 33.2 g/dL (ref 30.0–36.0)
MCV: 99.6 fL (ref 80.0–100.0)
Monocytes Absolute: 0.5 10*3/uL (ref 0.1–1.0)
Monocytes Relative: 12 %
Neutro Abs: 2.5 10*3/uL (ref 1.7–7.7)
Neutrophils Relative %: 56 %
Platelets: 223 10*3/uL (ref 150–400)
RBC: 2.66 MIL/uL — ABNORMAL LOW (ref 4.22–5.81)
RDW: 22.9 % — ABNORMAL HIGH (ref 11.5–15.5)
WBC: 4.5 10*3/uL (ref 4.0–10.5)
nRBC: 0 % (ref 0.0–0.2)

## 2023-02-07 LAB — COMPREHENSIVE METABOLIC PANEL
ALT: 20 U/L (ref 0–44)
AST: 34 U/L (ref 15–41)
Albumin: 2.6 g/dL — ABNORMAL LOW (ref 3.5–5.0)
Alkaline Phosphatase: 136 U/L — ABNORMAL HIGH (ref 38–126)
Anion gap: 9 (ref 5–15)
BUN: 14 mg/dL (ref 8–23)
CO2: 17 mmol/L — ABNORMAL LOW (ref 22–32)
Calcium: 7.5 mg/dL — ABNORMAL LOW (ref 8.9–10.3)
Chloride: 105 mmol/L (ref 98–111)
Creatinine, Ser: 1.07 mg/dL (ref 0.61–1.24)
GFR, Estimated: >60 mL/min (ref >60)
Glucose, Bld: 97 mg/dL (ref 70–99)
Potassium: 3.5 mmol/L (ref 3.5–5.1)
Sodium: 131 mmol/L — ABNORMAL LOW (ref 135–145)
Total Bilirubin: 0.5 mg/dL (ref 0.3–1.2)
Total Protein: 5.9 g/dL — ABNORMAL LOW (ref 6.5–8.1)

## 2023-02-07 LAB — LACTATE DEHYDROGENASE: LDH: 222 U/L — ABNORMAL HIGH (ref 98–192)

## 2023-02-07 LAB — TRIGLYCERIDES, BODY FLUIDS: Triglycerides, Fluid: 19 mg/dL

## 2023-02-07 LAB — MAGNESIUM: Magnesium: 1.4 mg/dL — ABNORMAL LOW (ref 1.7–2.4)

## 2023-02-07 LAB — BRAIN NATRIURETIC PEPTIDE: B Natriuretic Peptide: 1655.5 pg/mL — ABNORMAL HIGH (ref 0.0–100.0)

## 2023-02-07 MED ORDER — LORAZEPAM 1 MG PO TABS
1.0000 mg | ORAL_TABLET | ORAL | Status: AC | PRN
  Administered 2023-02-08: 2 mg via ORAL
  Filled 2023-02-07: qty 2

## 2023-02-07 MED ORDER — AMIODARONE HCL 200 MG PO TABS
200.0000 mg | ORAL_TABLET | Freq: Two times a day (BID) | ORAL | Status: AC
  Administered 2023-02-07 – 2023-02-14 (×13): 200 mg via ORAL
  Filled 2023-02-07 (×14): qty 1

## 2023-02-07 MED ORDER — MAGNESIUM SULFATE 2 GM/50ML IV SOLN
2.0000 g | Freq: Once | INTRAVENOUS | Status: AC
  Administered 2023-02-07: 2 g via INTRAVENOUS
  Filled 2023-02-07: qty 50

## 2023-02-07 MED ORDER — FUROSEMIDE 10 MG/ML IJ SOLN: 60.0000 mg | Freq: Once | INTRAMUSCULAR | Status: DC

## 2023-02-07 MED ORDER — POTASSIUM CHLORIDE CRYS ER 20 MEQ PO TBCR
40.0000 meq | EXTENDED_RELEASE_TABLET | Freq: Once | ORAL | Status: AC
  Administered 2023-02-07: 40 meq via ORAL
  Filled 2023-02-07: qty 2

## 2023-02-07 MED ORDER — CHLORDIAZEPOXIDE HCL 5 MG PO CAPS
15.0000 mg | ORAL_CAPSULE | Freq: Three times a day (TID) | ORAL | Status: DC
  Administered 2023-02-07 – 2023-02-08 (×6): 15 mg via ORAL
  Filled 2023-02-07 (×6): qty 3

## 2023-02-07 MED ORDER — MORPHINE SULFATE (PF) 2 MG/ML IV SOLN: 1.0000 mg | Freq: Three times a day (TID) | INTRAVENOUS | Status: DC | PRN

## 2023-02-07 MED ORDER — AMIODARONE HCL 200 MG PO TABS
200.0000 mg | ORAL_TABLET | Freq: Every day | ORAL | Status: DC
  Administered 2023-02-15 – 2023-02-16 (×2): 200 mg via ORAL
  Filled 2023-02-07 (×2): qty 1

## 2023-02-07 MED ORDER — POTASSIUM CHLORIDE CRYS ER 20 MEQ PO TBCR
20.0000 meq | EXTENDED_RELEASE_TABLET | Freq: Once | ORAL | Status: AC
  Administered 2023-02-07: 20 meq via ORAL
  Filled 2023-02-07: qty 1

## 2023-02-07 MED ORDER — FUROSEMIDE 10 MG/ML IJ SOLN
40.0000 mg | Freq: Once | INTRAMUSCULAR | Status: AC
  Administered 2023-02-07: 40 mg via INTRAVENOUS
  Filled 2023-02-07: qty 4

## 2023-02-07 MED ORDER — MAGNESIUM SULFATE 4 GM/100ML IV SOLN
4.0000 g | Freq: Once | INTRAVENOUS | Status: AC
  Administered 2023-02-07: 4 g via INTRAVENOUS
  Filled 2023-02-07: qty 100

## 2023-02-07 MED ORDER — LORAZEPAM 2 MG/ML IJ SOLN
1.0000 mg | INTRAMUSCULAR | Status: AC | PRN
  Administered 2023-02-07 – 2023-02-09 (×3): 2 mg via INTRAVENOUS
  Filled 2023-02-07 (×3): qty 1

## 2023-02-07 NOTE — Plan of Care (Signed)

## 2023-02-07 NOTE — Progress Notes (Signed)
PROGRESS NOTE                                                                                                                                                                                                             Patient Demographics:    Anthony [name], is a 67 y.o. male, DOB - Nov 03, 1955, GEX:[id number]  Outpatient Primary MD for the patient is Pcp, No    LOS - 3  Admit date - 02/03/2023    Chief Complaint  Patient presents with   Shortness of Breath   Hypoglycemia       Brief Narrative (HPI from H&P)   67 y.o. male with medical history significant of alcohol abuse, anxiety, HTN, homelessness, CVA who presented with chest pain and shortness of breath.  He was found to have a pulmonary embolism as well as atrial fibrillation with RVR and soft blood pressures, hyponatremic with edema, cardiology was consulted and he was admitted to the hospital.  Transferred to my care on 02/06/2023   Subjective:   Patient in bed, appears comfortable, denies any headache, no fever, no chest pain or pressure, no shortness of breath , no abdominal pain. No focal weakness.   Assessment  & Plan :    Acute Pulmonary Embolism with Infarct:  CT PE protocol with acute segmental and subsegmental PE in posterior RLL with associated developing pulmonary infarct in the lateral basal segment, negative lower extremity venous duplex.  Echo noted with reduction in RV systolic function.  Now transition from heparin drip to Eliquis.   Acute Fracture of L Lateral 3rd Rib with Associated Pulmonary Contusion:  Unchanged Comminuted Fracture of the L Clavicular Head and Subacute Fracture of the L Lateral 5th Rib, supportive care, PT OT, I-S for pulmonary toiletry.  Encouraged to sit in chair in daytime.  Monitor.  Atrial Fibrillation with RVR: Advised to score of at least 4.  Echo noted, discussed with cardiology on 02/06/2023 Dr. Jens Som, TSH stable currently on  amiodarone which will be continued, Eliquis.  Acute on chronic systolic left-sided heart failure along with RV systolic dysfunction as well.  Hold further fluids, diuresis as tolerated by blood pressure and monitor.  Blood pressure too low for ACE/ARB, low-dose beta-blocker if tolerated.    New right-sided pleural effusion.  Seen by pulmonary, repeat CT checked on 02/06/2023, does not look  like hemothorax, underwent bedside ultrasound-guided thoracentesis on 02/06/2023 fluid appears to be transudative, outpatient pulmonary follow-up postdischarge.  Will recheck chest x-ray on 02/07/2023.  Hyponatremia  - Has bilateral LE edema on exam, further IV fluids and diurese with Lasix and monitor   Non anion gap metabolic acidosis - Likely contraction alkalosis from intravascular depletion, third spacing of fluids and to some extent lactic acidosis.  Has been adequately hydrated.  Add protein supplements, diurese, this problem has resolved.   Etoh Abuse  Drinks 6 pack daily consult to quit, on Librium along with CIWA protocol.   Dyslipidemia  Crestor   Hypomagnesemia   Replaced, Continue to monitor   Chronic hypotension   blood pressure gradually improving, he is chronically on midodrine which will be continued   Hypoglycemia  Due to poor oral intake, stable random TSH and a.m. cortisol, monitor CBGs.  CBG (last 3)  No results for input(s): "GLUCAP" in the last 72 hours.        Condition - Extremely Guarded  Family Communication  :  None  Code Status :  Full  Consults  :  PCCM, Cards  PUD Prophylaxis :  PPI   Procedures  :     Echocardiogram.   1. Left ventricular ejection fraction, by estimation, is 40 to 45%. The left ventricle has mildly decreased function. The left ventricle demonstrates regional wall motion abnormalities (see scoring diagram/findings for description). Left ventricular diastolic function could not be evaluated. There is global LV hypokinesis with akinesis of the  basilar to mid inferior wall.  2. Right ventricular systolic function is severely reduced. The right ventricular size is moderately enlarged. Tricuspid regurgitation signal is inadequate for assessing PA pressure.  3. Left atrial size was severely dilated.  4. Right atrial size was severely dilated.  5. The mitral valve is thickened and the posterior leaflet is restricted there is moderate to severe central MR. The mitral valve is abnormal. Moderate to severe mitral valve regurgitation. No evidence of mitral stenosis.  6. The aortic valve is tricuspid. There is mild calcification of the aortic valve. Aortic valve regurgitation is not visualized. Aortic valve sclerosis/calcification is present, without any evidence of aortic stenosis.  7. The inferior vena cava is dilated in size with <50% respiratory variability, suggesting right atrial pressure of 15 mmHg.   CT head.  Nonacute.    CT chest repeat noncontrast on 02/06/2023.  1. New moderate right and small left pleural effusions with associated lower lobe consolidation, right greater than left. 2. Left rib fractures with interval increase in subpleural contusion in the anterolateral left upper lobe. 3. Stable mediastinal adenopathy. 4. New small volume perisplenic fluid. 5. Coronary and aortic Atherosclerosis (ICD10-I70.0).  CTA chest. 02/03/23 -  1. Acute segmental and subsegmental pulmonary emboli in the posterior right lower lobe with associated developing pulmonary infarct in the lateral basal segment. No evidence of acute right heart strain. 2. Acute fracture of the left lateral third rib with associated pulmonary contusion in the left upper lobe. No pneumothorax. 3. Unchanged comminuted fracture of the left clavicular head and subacute fracture of the left lateral fifth rib. 4. No acute findings in the abdomen or pelvis. 5. Colonic diverticulosis without evidence of acute diverticulitis. Aortic Atherosclerosis (ICD10-I70.0)    CT abdomen pelvis.  1.  Acute segmental and subsegmental pulmonary emboli in the posterior right lower lobe with associated developing pulmonary infarct in the lateral basal segment. No evidence of acute right heart strain. 2. Acute fracture of the  left lateral third rib with associated pulmonary contusion in the left upper lobe. No pneumothorax. 3. Unchanged comminuted fracture of the left clavicular head and subacute fracture of the left lateral fifth rib. 4. No acute findings in the abdomen or pelvis. 5. Colonic diverticulosis without evidence of acute diverticulitis. Aortic Atherosclerosis  Lower extremity venous duplex. No DVT      Disposition Plan  :    Status is: Inpatient  DVT Prophylaxis  :  Heparin gtt  Lab Results  Component Value Date   PLT 223 02/07/2023    Diet :  Diet Order             Diet regular Room service appropriate? Yes; Fluid consistency: Thin; Fluid restriction: 1200 mL Fluid  Diet effective now                    Inpatient Medications  Scheduled Meds:  (feeding supplement) PROSource Plus  30 mL Oral BID BM   amiodarone  200 mg Oral BID   [START ON 02/15/2023] amiodarone  200 mg Oral Daily   apixaban  10 mg Oral BID   Followed by   Melene Muller ON 02/13/2023] apixaban  5 mg Oral BID   chlordiazePOXIDE  15 mg Oral TID   ferrous sulfate  325 mg Oral Q breakfast   folic acid  1 mg Oral Daily   furosemide  60 mg Intravenous Once   guaiFENesin  600 mg Oral BID   lidocaine  3 patch Transdermal Q24H   metoprolol tartrate  25 mg Oral BID   midodrine  10 mg Oral TID WC   mometasone-formoterol  2 puff Inhalation BID   multivitamin with minerals  1 tablet Oral Daily   pantoprazole  40 mg Oral Daily   polyethylene glycol  17 g Oral BID   potassium chloride  20 mEq Oral Once   potassium chloride  40 mEq Oral Once   rosuvastatin  10 mg Oral Daily   [START ON 02/12/2023] thiamine  100 mg Oral Daily   Continuous Infusions:  magnesium sulfate bolus IVPB     magnesium sulfate bolus  IVPB     thiamine (VITAMIN B1) injection 250 mg (02/07/23 1104)   PRN Meds:.[COMPLETED] acetaminophen **FOLLOWED BY** acetaminophen, albuterol, LORazepam **OR** LORazepam, morphine injection, [DISCONTINUED] ondansetron **OR** ondansetron (ZOFRAN) IV, traMADol  Antibiotics  :    Anti-infectives (From admission, onward)    None         Objective:   Vitals:   02/07/23 0400 02/07/23 0500 02/07/23 0803 02/07/23 0843  BP: 90/76     Pulse:   90   Resp: (!) 27  16   Temp: 97.9 F (36.6 C)     TempSrc: Oral   Oral  SpO2: 96%  96% 96%  Weight:  74.6 kg    Height:        Wt Readings from Last 3 Encounters:  02/07/23 74.6 kg  01/06/23 63.5 kg  12/15/22 67.4 kg     Intake/Output Summary (Last 24 hours) at 02/07/2023 1109 Last data filed at 02/06/2023 1850 Gross per 24 hour  Intake 744.98 ml  Output --  Net 744.98 ml     Physical Exam  Awake Alert, No new F.N deficits, Normal affect [place].AT,PERRAL Supple Neck, No JVD,   Symmetrical Chest wall movement, moderate air movement bilaterally, reduced right lower lobe lung sounds, 1+ leg edema, RRR,No Gallops,Rubs or new Murmurs,  +ve B.Sounds, Abd Soft, No tenderness,   No Cyanosis,  Clubbing or edema     Data Review:    Recent Labs  Lab 02/03/23 1313 02/03/23 1405 02/04/23 0051 02/04/23 0226 02/05/23 0744 02/06/23 0326 02/07/23 0641  WBC 4.6  --  5.0  --  5.1 4.0 4.5  HGB 9.6*   < > 9.6* 10.9* 9.1* 8.5* 8.8*  HCT 28.6*   < > 28.8* 32.0* 27.4* 25.5* 26.5*  PLT 216  --  236  --  201 223 223  MCV 101.1*  --  101.8*  --  100.7* 100.0 99.6  MCH 33.9  --  33.9  --  33.5 33.3 33.1  MCHC 33.6  --  33.3  --  33.2 33.3 33.2  RDW 21.3*  --  21.5*  --  22.5* 22.5* 22.9*  LYMPHSABS 1.3  --   --   --   --   --  1.3  MONOABS 0.6  --   --   --   --   --  0.5  EOSABS 0.1  --   --   --   --   --  0.0  BASOSABS 0.1  --   --   --   --   --  0.0   < > = values in this interval not displayed.    Recent Labs  Lab 02/03/23 1313  02/03/23 1314 02/03/23 1405 02/04/23 0051 02/04/23 0226 02/04/23 0300 02/04/23 1259 02/04/23 1827 02/04/23 1828 02/05/23 0744 02/06/23 0326 02/07/23 0641  NA 123*  --    < > 121*   < > 122* 121*  --  123* 124*  --  131*  K 3.7  --    < > 4.7   < > 4.5 4.1  --  3.9 3.6  --  3.5  CL 96*  --    < > 93*  --  95* 97*  --  98 100  --  105  CO2 14*  --    < > 12*  --  14* 15*  --  14* 15*  --  17*  ANIONGAP 13  --    < > 16*  --  13 9  --  11 9  --  9  GLUCOSE 86  --    < > 133*  --  135* 123*  --  114* 122*  --  97  BUN 8  --    < > 6*  --  7* 8  --  7* 9  --  14  CREATININE 1.01  --    < > 0.82  --  0.79 0.81  --  0.75 0.87  --  1.07  AST 50*  --   --  49*  45*  --   --   --   --   --   --   --  34  ALT 23  --   --  23  23  --   --   --   --   --   --   --  20  ALKPHOS 138*  --   --  140*  137*  --   --   --   --   --   --   --  136*  BILITOT 1.4*  --   --  1.2  0.9  --   --   --   --   --   --   --  0.5  ALBUMIN 3.3*  --   --  2.9*  2.8*  --   --   --   --   --   --   --  2.6*  LATICACIDVEN  --   --   --  3.3*  --   --   --  2.0*  --   --   --   --   TSH  --   --   --  5.143*  --   --   --   --   --   --  2.795  --   HGBA1C  --   --   --   --   --   --   --   --   --   --  5.2  --   AMMONIA  --   --   --  32  --   --   --   --   --   --   --   --   BNP  --  817.0*  --  1,077.8*  --   --   --   --   --   --  1,388.1* 1,655.5*  MG 1.2*  --   --  1.3*  1.3*  --   --   --   --   --   --   --  1.4*  CALCIUM 8.0*  --    < > 7.5*  --  7.5* 7.0*  --  7.1* 7.0*  --  7.5*   < > = values in this interval not displayed.      Recent Labs  Lab 02/03/23 1313 02/03/23 1314 02/03/23 2252 02/04/23 0051 02/04/23 0300 02/04/23 1259 02/04/23 1827 02/04/23 1828 02/05/23 0744 02/06/23 0326 02/07/23 0641  LATICACIDVEN  --   --   --  3.3*  --   --  2.0*  --   --   --   --   TSH  --   --   --  5.143*  --   --   --   --   --  2.795  --   HGBA1C  --   --   --   --   --   --   --   --   --   5.2  --   AMMONIA  --   --   --  32  --   --   --   --   --   --   --   BNP  --  817.0*  --  1,077.8*  --   --   --   --   --  1,388.1* 1,655.5*  MG 1.2*  --   --  1.3*  1.3*  --   --   --   --   --   --  1.4*  CALCIUM 8.0*  --    < > 7.5* 7.5* 7.0*  --  7.1* 7.0*  --  7.5*   < > = values in this interval not displayed.    Recent Labs  Lab 02/03/23 1313 02/03/23 1405 02/04/23 0051 02/04/23 0300 02/04/23 1259 02/04/23 1827 02/04/23 1828 02/05/23 0744 02/06/23 0326 02/07/23 0641  WBC 4.6  --  5.0  --   --   --   --  5.1 4.0 4.5  PLT 216  --  236  --   --   --   --  201 223 223  LATICACIDVEN  --   --  3.3*  --   --  2.0*  --   --   --   --   CREATININE 1.01   < > 0.82 0.79 0.81  --  0.75 0.87  --  1.07   < > = values in this interval not displayed.    ------------------------------------------------------------------------------------------------------------------ Lab Results  Component Value Date   CHOL 121 12/04/2022   HDL 60 12/04/2022   LDLCALC 48 12/04/2022   TRIG 63 12/04/2022   CHOLHDL 2.0 12/04/2022    Lab Results  Component Value Date   HGBA1C 5.2 02/06/2023    Recent Labs    02/06/23 0326  TSH 2.795   ------------------------------------------------------------------------------------------------------------------ Cardiac Enzymes No results for input(s): "CKMB", "TROPONINI", "MYOGLOBIN" in the last 168 hours.  Invalid input(s): "CK"  Micro Results Recent Results (from the past 240 hour(s))  Resp panel by RT-PCR (RSV, Flu A&B, Covid) Anterior Nasal Swab     Status: None   Collection Time: 02/03/23  2:39 PM   Specimen: Anterior Nasal Swab  Result Value Ref Range Status   SARS Coronavirus 2 by RT PCR NEGATIVE NEGATIVE Final   Influenza A by PCR NEGATIVE NEGATIVE Final   Influenza B by PCR NEGATIVE NEGATIVE Final    Comment: (NOTE) The Xpert Xpress SARS-CoV-2/FLU/RSV plus assay is intended as an aid in the diagnosis of influenza from Nasopharyngeal  swab specimens and should not be used as a sole basis for treatment. Nasal washings and aspirates are unacceptable for Xpert Xpress SARS-CoV-2/FLU/RSV testing.  Fact Sheet for Patients: BloggerCourse.com  Fact Sheet for Healthcare Providers: SeriousBroker.it  This test is not yet approved or cleared by the [place] FDA and has been authorized for detection and/or diagnosis of SARS-CoV-2 by FDA under an Emergency Use Authorization (EUA). This EUA will remain in effect (meaning this test can be used) for the duration of the COVID-19 declaration under Section 564(b)(1) of the Act, 21 U.S.C. section 360bbb-3(b)(1), unless the authorization is terminated or revoked.     Resp Syncytial Virus by PCR NEGATIVE NEGATIVE Final    Comment: (NOTE) Fact Sheet for Patients: BloggerCourse.com  Fact Sheet for Healthcare Providers: SeriousBroker.it  This test is not yet approved or cleared by the [place] FDA and has been authorized for detection and/or diagnosis of SARS-CoV-2 by FDA under an Emergency Use Authorization (EUA). This EUA will remain in effect (meaning this test can be used) for the duration of the COVID-19 declaration under Section 564(b)(1) of the Act, 21 U.S.C. section 360bbb-3(b)(1), unless the authorization is terminated or revoked.  Performed at [facility], [address], [place], [place] [address]   Culture, body fluid w Gram Stain-bottle     Status: None (Preliminary result)   Collection Time: 02/06/23 12:00 PM   Specimen: Pleura  Result Value Ref Range Status   Specimen Description PLEURAL  Final   Special Requests NONE  Final   Culture   Final    NO GROWTH < 24 HOURS Performed at [facility], [address], [place], [place] [address]    Report Status PENDING  Incomplete  Gram stain     Status: None   Collection Time: 02/06/23 12:00 PM    Specimen: Pleura  Result Value Ref Range Status   Specimen Description PLEURAL  Final   Special Requests NONE  Final   Gram Stain   Final    FEW WBC SEEN NO ORGANISMS SEEN Performed at [facility], [address], [place], [place] [address]    Report Status  02/06/2023 FINAL  Final    Radiology Reports DG Chest Port 1 View  Result Date: 02/06/2023 CLINICAL DATA:  Post thoracentesis. EXAM: PORTABLE CHEST 1 VIEW COMPARISON:  CT and radiographs 02/06/2023. FINDINGS: 1135 hours. The heart size and mediastinal contours are stable with mild aortic atherosclerosis. Right pleural effusion has decreased in volume following presumed right-sided thoracentesis. No evidence of pneumothorax. A small left pleural effusion appears unchanged. There is interval improved aeration of the right lung. Patchy left basilar pulmonary opacity appears stable. No acute osseous findings are seen; known left-sided rib fractures are not well visualized. Telemetry leads overlie the chest. IMPRESSION: 1. Decreased right pleural effusion following thoracentesis. No evidence of pneumothorax. 2. Stable small left pleural effusion and patchy left basilar pulmonary opacity. Electronically Signed   By: Carey Bullocks M.D.   On: 02/06/2023 12:02   CT CHEST WO CONTRAST  Result Date: 02/06/2023 CLINICAL DATA:  Pneumonia, complication suspected, xray done EXAM: CT CHEST WITHOUT CONTRAST TECHNIQUE: Multidetector CT imaging of the chest was performed following the standard protocol without IV contrast. RADIATION DOSE REDUCTION: This exam was performed according to the departmental dose-optimization program which includes automated exposure control, adjustment of the mA and/or kV according to patient size and/or use of iterative reconstruction technique. COMPARISON:  CTA 02/03/2023 FINDINGS: Cardiovascular: Coronary and scattered aortic calcifications. Mild four-chamber cardiac enlargement. No pericardial effusion. Mediastinum/Nodes:  No mediastinal hematoma or mass. 1.2 cm subcarinal node, previously 1.4 on 01/06/2023. 1.4 cm right paratracheal node, previously 1.3. Additional scattered subcentimeter prominent pre-vascular, and AP window lymph nodes stable. No definite hilar adenopathy. Postop changes at the GE junction with regional streaky changes in the posterior mediastinal fat, stable since 01/06/2023. Lungs/Pleura: New moderate right pleural effusion. New small left pleural effusion. No pneumothorax. Fairly dense consolidation throughout most of the right lower lobe, and in the posterior dependent aspect of the left lower lobe. Subpleural ground-glass opacities in the anterolateral left upper lobe have slightly increased since previous exam Upper Abdomen: Surgical clips left upper quadrant. Small volume perisplenic fluid, new since prior study. Layering hyperdensity in the gallbladder possibly vicarious excretion of previously administered IV contrast material. Musculoskeletal: Minimally displaced fracture deformities of left ribs 3 and 5 as before. Comminuted left clavicle fracture as before. Bilateral acromioclavicular and glenohumeral degenerative change. IMPRESSION: 1. New moderate right and small left pleural effusions with associated lower lobe consolidation, right greater than left. 2. Left rib fractures with interval increase in subpleural contusion in the anterolateral left upper lobe. 3. Stable mediastinal adenopathy. 4. New small volume perisplenic fluid. 5. Coronary and aortic Atherosclerosis (ICD10-I70.0). Electronically Signed   By: Corlis Leak M.D.   On: 02/06/2023 09:24   DG Chest Port 1 View  Result Date: 02/06/2023 CLINICAL DATA:  Shortness of breath EXAM: PORTABLE CHEST 1 VIEW COMPARISON:  Abdominal CT from 3 days ago FINDINGS: Sizable right pleural effusion with leftward displacement of the mediastinum. Underlying pulmonary opacity in the setting of pulmonary embolism. Cardiomegaly. No pneumothorax. Chronic fracture  at the lateral right clavicle. Prelim sent in epic chat. IMPRESSION: Large right pleural effusion since chest CT 3 days ago. Consider updated CT. Electronically Signed   By: Tiburcio Pea M.D.   On: 02/06/2023 06:58   VAS [place] LOWER EXTREMITY VENOUS (DVT)  Result Date: 02/04/2023  Lower Venous DVT Study Patient Name:  Janzen A Piloto  Date of Exam:   02/04/2023 Medical Rec #: [id number]     Accession #:    [id number] Date of Birth: 1955/11/06  Patient Gender: M Patient Age:   80 years Exam Location:  [facility] Procedure:      VAS [place] LOWER EXTREMITY VENOUS (DVT) Referring Phys: Jonny Ruiz DOUTOVA --------------------------------------------------------------------------------  Indications: Pulmonary embolism, and Swelling.  Comparison Study: Prior negative bilateral LEV done 11/04/16, prior negative left                   LEV done 05/19/22 Performing Technologist: Sherren Kerns RVS  Examination Guidelines: A complete evaluation includes B-mode imaging, spectral Doppler, color Doppler, and power Doppler as needed of all accessible portions of each vessel. Bilateral testing is considered an integral part of a complete examination. Limited examinations for reoccurring indications may be performed as noted. The reflux portion of the exam is performed with the patient in reverse Trendelenburg.  +--------+---------------+---------+-----------+----------------+-------------+ RIGHT   CompressibilityPhasicitySpontaneityProperties      Thrombus                                                                 Aging         +--------+---------------+---------+-----------+----------------+-------------+ CFV     Full                               pulsatile                                                                waveforms                     +--------+---------------+---------+-----------+----------------+-------------+ SFJ     Full                                                              +--------+---------------+---------+-----------+----------------+-------------+ FV Prox Full                                                             +--------+---------------+---------+-----------+----------------+-------------+ FV Mid  Full                                                             +--------+---------------+---------+-----------+----------------+-------------+ FV      Full  Distal                                                                   +--------+---------------+---------+-----------+----------------+-------------+ PFV     Full                                                             +--------+---------------+---------+-----------+----------------+-------------+ POP     Full                               pulsatile                                                                waveforms                     +--------+---------------+---------+-----------+----------------+-------------+ PTV     Full                                                             +--------+---------------+---------+-----------+----------------+-------------+ PERO    Full                                                             +--------+---------------+---------+-----------+----------------+-------------+   +---------+---------------+---------+-----------+---------------+-------------+ LEFT     CompressibilityPhasicitySpontaneityProperties     Thrombus                                                                 Aging         +---------+---------------+---------+-----------+---------------+-------------+ CFV      Full                               pulsatile                                                                waveforms                    +---------+---------------+---------+-----------+---------------+-------------+  SFJ      Full                                                             +---------+---------------+---------+-----------+---------------+-------------+ FV Prox  Full                                                            +---------+---------------+---------+-----------+---------------+-------------+ FV Mid   Full                                                            +---------+---------------+---------+-----------+---------------+-------------+ FV DistalFull                                                            +---------+---------------+---------+-----------+---------------+-------------+ PFV      Full                                                            +---------+---------------+---------+-----------+---------------+-------------+ POP      Full                               pulsatile                                                                waveforms                    +---------+---------------+---------+-----------+---------------+-------------+ PTV      Full                                                            +---------+---------------+---------+-----------+---------------+-------------+ PERO     Full                                                            +---------+---------------+---------+-----------+---------------+-------------+ Soleal   Full                                                            +---------+---------------+---------+-----------+---------------+-------------+  Summary: BILATERAL: - No evidence of deep vein thrombosis seen in the lower extremities, bilaterally. -No evidence of popliteal cyst, bilaterally.   *See table(s) above for measurements and observations. Electronically signed by Heath Lark on 02/04/2023 at 2:15:34 PM.    Final    ECHOCARDIOGRAM COMPLETE  Result Date: 02/04/2023    ECHOCARDIOGRAM REPORT   Patient Name:   Ikenna A Bustamante Date of Exam: 02/04/2023 Medical Rec #:  [id number]     Height:       69.0 in Accession #:    [id number]   Weight:       150.0 lb Date of Birth:  June 21, 1956    BSA:          1.828 m Patient Age:    67 years     BP:           115/85 mmHg Patient Gender: M            HR:           105 bpm. Exam Location:  Inpatient Procedure: 2D Echo, Cardiac Doppler and Color Doppler Indications:    pulmonary embolus  History:        Patient has prior history of Echocardiogram examinations, most                 recent 08/28/2022. Arrythmias:Atrial Fibrillation; Risk                 Factors:Hypertension, Dyslipidemia and Alcohol abuse.  Sonographer:    Delcie Roch RDCS Referring Phys: [id number] ANASTASSIA DOUTOVA IMPRESSIONS  1. Left ventricular ejection fraction, by estimation, is 40 to 45%. The left ventricle has mildly decreased function. The left ventricle demonstrates regional wall motion abnormalities (see scoring diagram/findings for description). Left ventricular diastolic function could not be evaluated. There is global LV hypokinesis with akinesis of the basilar to mid inferior wall.  2. Right ventricular systolic function is severely reduced. The right ventricular size is moderately enlarged. Tricuspid regurgitation signal is inadequate for assessing PA pressure.  3. Left atrial size was severely dilated.  4. Right atrial size was severely dilated.  5. The mitral valve is thickened and the posterior leaflet is restricted there is moderate to severe central MR. The mitral valve is abnormal. Moderate to severe mitral valve regurgitation. No evidence of mitral stenosis.  6. The aortic valve is tricuspid. There is mild calcification of the aortic valve. Aortic valve regurgitation is not visualized. Aortic valve sclerosis/calcification is present, without any evidence of aortic stenosis.  7. The inferior vena cava is dilated in size with <50% respiratory variability, suggesting right atrial pressure of 15 mmHg. FINDINGS  Left Ventricle: Left ventricular ejection fraction, by  estimation, is 40 to 45%. The left ventricle has mildly decreased function. The left ventricle demonstrates regional wall motion abnormalities. The left ventricular internal cavity size was normal in size. There is no left ventricular hypertrophy. Left ventricular diastolic function could not be evaluated due to atrial fibrillation. Left ventricular diastolic function could not be evaluated.  LV Wall Scoring: The inferior wall is akinetic. Right Ventricle: The right ventricular size is moderately enlarged. No increase in right ventricular wall thickness. Right ventricular systolic function is severely reduced. Tricuspid regurgitation signal is inadequate for assessing PA pressure. The tricuspid regurgitant velocity is 2.13 m/s, and with an assumed right atrial pressure of 8 mmHg, the estimated right ventricular systolic pressure is 26.1 mmHg. Left Atrium: Left atrial size was severely dilated. Right Atrium: Right atrial size was severely  dilated. Pericardium: There is no evidence of pericardial effusion. Mitral Valve: The mitral valve is thickened and the posterior leaflet is restricted there is moderate to severe central MR. The mitral valve is abnormal. There is moderate thickening of the mitral valve leaflet(s). There is moderate calcification of the mitral valve leaflet(s). Moderate to severe mitral valve regurgitation. No evidence of mitral valve stenosis. Tricuspid Valve: The tricuspid valve is normal in structure. Tricuspid valve regurgitation is not demonstrated. No evidence of tricuspid stenosis. Aortic Valve: The aortic valve is tricuspid. There is mild calcification of the aortic valve. Aortic valve regurgitation is not visualized. Aortic valve sclerosis/calcification is present, without any evidence of aortic stenosis. Pulmonic Valve: The pulmonic valve was normal in structure. Pulmonic valve regurgitation is not visualized. No evidence of pulmonic stenosis. Aorta: The aortic root is normal in size and  structure. Venous: The inferior vena cava is dilated in size with less than 50% respiratory variability, suggesting right atrial pressure of 15 mmHg. IAS/Shunts: No atrial level shunt detected by color flow Doppler.  LEFT VENTRICLE PLAX 2D LVIDd:         4.10 cm LVIDs:         3.80 cm LV PW:         0.80 cm LV IVS:        0.70 cm LVOT diam:     2.10 cm LVOT Area:     3.46 cm  RIGHT VENTRICLE          IVC RV Basal diam:  3.40 cm  IVC diam: 2.30 cm TAPSE (M-mode): 1.4 cm LEFT ATRIUM              Index        RIGHT ATRIUM           Index LA diam:        4.40 cm  2.41 cm/m   RA Area:     20.40 cm LA Vol (A2C):   100.0 ml 54.70 ml/m  RA Volume:   58.50 ml  32.00 ml/m LA Vol (A4C):   76.6 ml  41.90 ml/m LA Biplane Vol: 88.6 ml  48.46 ml/m   AORTA Ao Root diam: 3.30 cm Ao Asc diam:  3.30 cm TRICUSPID VALVE TR Peak grad:   18.1 mmHg TR Vmax:        213.00 cm/s  SHUNTS Systemic Diam: 2.10 cm Arvilla Meres MD Electronically signed by Arvilla Meres MD Signature Date/Time: 02/04/2023/10:27:44 AM    Final    CT Angio Chest PE W and/or Wo Contrast  Result Date: 02/03/2023 CLINICAL DATA:  Pulmonary embolism (PE) suspected, high prob; Abdominal pain, acute, nonlocalized. EXAM: CT ANGIOGRAPHY CHEST CT ABDOMEN AND PELVIS WITH CONTRAST TECHNIQUE: Multidetector CT imaging of the chest was performed using the standard protocol during bolus administration of intravenous contrast. Multiplanar CT image reconstructions and MIPs were obtained to evaluate the vascular anatomy. Multidetector CT imaging of the abdomen and pelvis was performed using the standard protocol during bolus administration of intravenous contrast. RADIATION DOSE REDUCTION: This exam was performed according to the departmental dose-optimization program which includes automated exposure control, adjustment of the mA and/or kV according to patient size and/or use of iterative reconstruction technique. CONTRAST:  75mL OMNIPAQUE IOHEXOL 350 MG/ML SOLN  COMPARISON:  Chest CT 01/06/2023.  CT abdomen/pelvis 05/20/2022. FINDINGS: CTA CHEST FINDINGS Cardiovascular: Acute segmental and subsegmental pulmonary emboli in the posterior right lower lobe (for example, coronal image 46 series 8) with associated developing pulmonary infarct in the  lateral basal segment (axial image 219 series 6). No evidence of acute right heart strain. Extensive coronary artery calcifications. Mediastinum/Nodes: Postoperative changes of prior hiatal hernia repair. No thoracic lymphadenopathy. Lungs/Pleura: Subpleural ground-glass opacity in the left upper lobe subjacent to an acute fracture of the left lateral third rib (axial image 39 series 7), consistent with pulmonary contusion. No pneumothorax. Mucous plugging and developing atelectasis in the medial basal segment of the right lower lobe. Musculoskeletal: Unchanged comminuted fracture of the left clavicular head and subacute fracture of the left lateral fifth rib. Review of the MIP images confirms the above findings. CT ABDOMEN and PELVIS FINDINGS Hepatobiliary: No focal liver abnormality is seen. No gallstones, gallbladder wall thickening, or biliary dilatation. Pancreas: Unremarkable. No pancreatic ductal dilatation or surrounding inflammatory changes. Spleen: Normal. Adrenals/Urinary Tract: Adrenal glands are unremarkable. Kidneys are normal, without renal calculi, focal lesion, or hydronephrosis. Bladder is unremarkable. Stomach/Bowel: Normal stomach and duodenum. No dilated loops of small bowel. Appendix is not visualized. Colonic diverticulosis without evidence of acute diverticulitis. Vascular/Lymphatic: Aortic atherosclerosis. No enlarged abdominal or pelvic lymph nodes. Reproductive: Prostate is unremarkable. Other: Diffuse body wall edema. Musculoskeletal: Unchanged mild anterior compression deformities of the L2 through L5 vertebral bodies. Review of the MIP images confirms the above findings. IMPRESSION: 1. Acute segmental and  subsegmental pulmonary emboli in the posterior right lower lobe with associated developing pulmonary infarct in the lateral basal segment. No evidence of acute right heart strain. 2. Acute fracture of the left lateral third rib with associated pulmonary contusion in the left upper lobe. No pneumothorax. 3. Unchanged comminuted fracture of the left clavicular head and subacute fracture of the left lateral fifth rib. 4. No acute findings in the abdomen or pelvis. 5. Colonic diverticulosis without evidence of acute diverticulitis. Aortic Atherosclerosis (ICD10-I70.0). Electronically Signed   By: Orvan Falconer M.D.   On: 02/03/2023 18:31   CT ABDOMEN PELVIS W CONTRAST  Result Date: 02/03/2023 CLINICAL DATA:  Pulmonary embolism (PE) suspected, high prob; Abdominal pain, acute, nonlocalized. EXAM: CT ANGIOGRAPHY CHEST CT ABDOMEN AND PELVIS WITH CONTRAST TECHNIQUE: Multidetector CT imaging of the chest was performed using the standard protocol during bolus administration of intravenous contrast. Multiplanar CT image reconstructions and MIPs were obtained to evaluate the vascular anatomy. Multidetector CT imaging of the abdomen and pelvis was performed using the standard protocol during bolus administration of intravenous contrast. RADIATION DOSE REDUCTION: This exam was performed according to the departmental dose-optimization program which includes automated exposure control, adjustment of the mA and/or kV according to patient size and/or use of iterative reconstruction technique. CONTRAST:  75mL OMNIPAQUE IOHEXOL 350 MG/ML SOLN COMPARISON:  Chest CT 01/06/2023.  CT abdomen/pelvis 05/20/2022. FINDINGS: CTA CHEST FINDINGS Cardiovascular: Acute segmental and subsegmental pulmonary emboli in the posterior right lower lobe (for example, coronal image 46 series 8) with associated developing pulmonary infarct in the lateral basal segment (axial image 219 series 6). No evidence of acute right heart strain. Extensive  coronary artery calcifications. Mediastinum/Nodes: Postoperative changes of prior hiatal hernia repair. No thoracic lymphadenopathy. Lungs/Pleura: Subpleural ground-glass opacity in the left upper lobe subjacent to an acute fracture of the left lateral third rib (axial image 39 series 7), consistent with pulmonary contusion. No pneumothorax. Mucous plugging and developing atelectasis in the medial basal segment of the right lower lobe. Musculoskeletal: Unchanged comminuted fracture of the left clavicular head and subacute fracture of the left lateral fifth rib. Review of the MIP images confirms the above findings. CT ABDOMEN and PELVIS FINDINGS Hepatobiliary: No  focal liver abnormality is seen. No gallstones, gallbladder wall thickening, or biliary dilatation. Pancreas: Unremarkable. No pancreatic ductal dilatation or surrounding inflammatory changes. Spleen: Normal. Adrenals/Urinary Tract: Adrenal glands are unremarkable. Kidneys are normal, without renal calculi, focal lesion, or hydronephrosis. Bladder is unremarkable. Stomach/Bowel: Normal stomach and duodenum. No dilated loops of small bowel. Appendix is not visualized. Colonic diverticulosis without evidence of acute diverticulitis. Vascular/Lymphatic: Aortic atherosclerosis. No enlarged abdominal or pelvic lymph nodes. Reproductive: Prostate is unremarkable. Other: Diffuse body wall edema. Musculoskeletal: Unchanged mild anterior compression deformities of the L2 through L5 vertebral bodies. Review of the MIP images confirms the above findings. IMPRESSION: 1. Acute segmental and subsegmental pulmonary emboli in the posterior right lower lobe with associated developing pulmonary infarct in the lateral basal segment. No evidence of acute right heart strain. 2. Acute fracture of the left lateral third rib with associated pulmonary contusion in the left upper lobe. No pneumothorax. 3. Unchanged comminuted fracture of the left clavicular head and subacute fracture  of the left lateral fifth rib. 4. No acute findings in the abdomen or pelvis. 5. Colonic diverticulosis without evidence of acute diverticulitis. Aortic Atherosclerosis (ICD10-I70.0). Electronically Signed   By: Orvan Falconer M.D.   On: 02/03/2023 18:31   CT Head Wo Contrast  Result Date: 02/03/2023 CLINICAL DATA:  Mental status change EXAM: CT HEAD WITHOUT CONTRAST TECHNIQUE: Contiguous axial images were obtained from the base of the skull through the vertex without intravenous contrast. RADIATION DOSE REDUCTION: This exam was performed according to the departmental dose-optimization program which includes automated exposure control, adjustment of the mA and/or kV according to patient size and/or use of iterative reconstruction technique. COMPARISON:  01/06/2023 FINDINGS: Brain: No evidence of acute infarction, hemorrhage, mass, mass effect, or midline shift. No hydrocephalus or extra-axial fluid collection. Vascular: No hyperdense vessel. Skull: Negative for fracture or focal lesion. Sinuses/Orbits: No acute finding. Other: The mastoid air cells are well aerated. IMPRESSION: No acute intracranial process. Electronically Signed   By: Wiliam Ke M.D.   On: 02/03/2023 18:16   DG Chest Port 1 View  Result Date: 02/03/2023 CLINICAL DATA:  Shortness of breath and chest pain.  Lethargy. EXAM: PORTABLE CHEST 1 VIEW COMPARISON:  12/02/2022 FINDINGS: Cardiomegaly. Tortuous aorta. Pulmonary venous hypertension, possibly with early interstitial edema. No infiltrate or collapse. No acute bone finding. IMPRESSION: Cardiomegaly. Pulmonary venous hypertension, possibly with early interstitial edema. Electronically Signed   By: Paulina Fusi M.D.   On: 02/03/2023 14:35      Signature  -   Susa Raring M.D on 02/07/2023 at 11:09 AM   -  To page go to [email or web address]

## 2023-02-08 DIAGNOSIS — I2694 Multiple subsegmental pulmonary emboli without acute cor pulmonale: Secondary | ICD-10-CM | POA: Diagnosis not present

## 2023-02-08 LAB — COMPREHENSIVE METABOLIC PANEL
ALT: 24 U/L (ref 0–44)
AST: 47 U/L — ABNORMAL HIGH (ref 15–41)
Albumin: 2.7 g/dL — ABNORMAL LOW (ref 3.5–5.0)
Alkaline Phosphatase: 172 U/L — ABNORMAL HIGH (ref 38–126)
Anion gap: 11 (ref 5–15)
BUN: 17 mg/dL (ref 8–23)
CO2: 18 mmol/L — ABNORMAL LOW (ref 22–32)
Calcium: 8 mg/dL — ABNORMAL LOW (ref 8.9–10.3)
Chloride: 105 mmol/L (ref 98–111)
Creatinine, Ser: 1.16 mg/dL (ref 0.61–1.24)
GFR, Estimated: >60 mL/min (ref >60)
Glucose, Bld: 92 mg/dL (ref 70–99)
Potassium: 4.4 mmol/L (ref 3.5–5.1)
Sodium: 134 mmol/L — ABNORMAL LOW (ref 135–145)
Total Bilirubin: 0.6 mg/dL (ref 0.3–1.2)
Total Protein: 6.4 g/dL — ABNORMAL LOW (ref 6.5–8.1)

## 2023-02-08 LAB — CBC WITH DIFFERENTIAL/PLATELET
Abs Immature Granulocytes: 0.03 10*3/uL (ref 0.00–0.07)
Basophils Absolute: 0 10*3/uL (ref 0.0–0.1)
Basophils Relative: 1 %
Eosinophils Absolute: 0 10*3/uL (ref 0.0–0.5)
Eosinophils Relative: 1 %
HCT: 30.3 % — ABNORMAL LOW (ref 39.0–52.0)
Hemoglobin: 9.9 g/dL — ABNORMAL LOW (ref 13.0–17.0)
Immature Granulocytes: 1 %
Lymphocytes Relative: 24 %
Lymphs Abs: 1.5 10*3/uL (ref 0.7–4.0)
MCH: 34.1 pg — ABNORMAL HIGH (ref 26.0–34.0)
MCHC: 32.7 g/dL (ref 30.0–36.0)
MCV: 104.5 fL — ABNORMAL HIGH (ref 80.0–100.0)
Monocytes Absolute: 0.7 10*3/uL (ref 0.1–1.0)
Monocytes Relative: 12 %
Neutro Abs: 3.7 10*3/uL (ref 1.7–7.7)
Neutrophils Relative %: 61 %
Platelets: 283 10*3/uL (ref 150–400)
RBC: 2.9 MIL/uL — ABNORMAL LOW (ref 4.22–5.81)
RDW: 23.4 % — ABNORMAL HIGH (ref 11.5–15.5)
WBC: 6 10*3/uL (ref 4.0–10.5)
nRBC: 0.5 % — ABNORMAL HIGH (ref 0.0–0.2)

## 2023-02-08 LAB — BRAIN NATRIURETIC PEPTIDE: B Natriuretic Peptide: 1747.4 pg/mL — ABNORMAL HIGH (ref 0.0–100.0)

## 2023-02-08 LAB — MAGNESIUM: Magnesium: 2.2 mg/dL (ref 1.7–2.4)

## 2023-02-08 MED ORDER — QUETIAPINE FUMARATE 50 MG PO TABS
50.0000 mg | ORAL_TABLET | Freq: Two times a day (BID) | ORAL | Status: DC
  Administered 2023-02-08 – 2023-02-16 (×16): 50 mg via ORAL
  Filled 2023-02-08: qty 2
  Filled 2023-02-08 (×16): qty 1

## 2023-02-08 NOTE — TOC Initial Note (Signed)
Transition of Care [facility]) - Initial/Assessment Note    Patient Details  Name: Anthony [name] MRN: [id number] Date of Birth: [birth year]  Transition of Care [facility]) CM/SW Contact:    [name], LCSW Phone Number: 02/08/2023, 5:26 PM  Clinical Narrative:                 CSW following for SNF placement as patient is currently homeless staying at the [facility] (where they sleep on the floor). Pasrr pending.   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, SNF Pending bed offer   Patient Goals and CMS Choice            Expected Discharge Plan and Services In-house Referral: Clinical Social Work   Post Acute Care Choice: Skilled Nursing Facility Living arrangements for the past 2 months: Homeless Shelter                                      Prior Living Arrangements/Services Living arrangements for the past 2 months: Homeless Shelter Lives with:: Self Patient language and need for interpreter reviewed:: Yes        Need for Family Participation in Patient Care: No (Comment) Care giver support system in place?: No (comment)   Criminal Activity/Legal Involvement Pertinent to Current Situation/Hospitalization: No - Comment as needed  Activities of Daily Living      Permission Sought/Granted                  Emotional Assessment Appearance:: Appears stated age     Orientation: : Oriented to Self, Oriented to  Time, Oriented to Place Alcohol / Substance Use: Alcohol Use Psych Involvement: No (comment)  Admission diagnosis:  Hypomagnesemia [E83.42] Hyponatremia [E87.1] Pulmonary embolus (HCC) [I26.99] Pulmonary embolism (HCC) [I26.99] Hypoglycemia [E16.2] Atrial fibrillation with RVR (HCC) [I48.91] Patient Active Problem List   Diagnosis Date Noted   Rib fractures 02/04/2023   Hypoglycemia 02/04/2023   Pulmonary embolus (HCC) 02/04/2023   Pulmonary  embolism (HCC) 02/03/2023   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 difficile colitis 09/11/2022   Aspiration pneumonia (HCC) 08/27/2022   Aortic atherosclerosis (HCC) 08/27/2022   Depression, unspecified 08/09/2022   Hyponatremia 06/01/2022   Non compliance w medication regimen 05/31/2022   Delirium 05/25/2022   Stricture and stenosis of esophagus 05/21/2022   Gastritis and gastroduodenitis 05/21/2022   Loose stools 05/20/2022   Malnutrition of moderate degree 05/20/2022   Acute on chronic congestive heart failure (HCC)    Leukocytosis 05/19/2022   Sepsis (HCC) 05/19/2022   Cellulitis of left leg, possible 05/19/2022    Class: Question of   Moniliasis, interdigital 05/19/2022   Open toe wound, right foot, medial fourth digit 05/19/2022   Malnutrition (HCC) 05/19/2022   Alcohol abuse 03/24/2022   Dyslipidemia 03/24/2022   Acute on chronic diastolic CHF (congestive heart failure) (HCC) 03/24/2022   Acute respiratory failure with hypoxia (HCC) 03/23/2022   Generalized weakness    Atrial fibrillation with RVR (HCC) 03/15/2022   Hypokalemia 03/15/2022   Decreased functional mobility and endurance 06/22/2021   Iron deficiency anemia 10/16/2020   Cough  Hypomagnesemia    Protein-calorie malnutrition, severe 08/22/2020   Alcohol use disorder, severe, in controlled environment (HCC)    Pressure injury of skin 08/18/2020   Urinary tract infection without hematuria    Severe sepsis (HCC) 08/14/2020   Paroxysmal atrial fibrillation (HCC) 08/13/2020   Rhabdomyolysis 07/22/2020   Cerebral ventriculomegaly 07/22/2020   Hemoglobin decreased 07/22/2020   History of delirium tremons 07/22/2020    Hypoalbuminemia due to protein-calorie malnutrition (HCC) 07/22/2020   Tobacco use disorder 07/22/2020   Alcohol withdrawal delirium (HCC)    Dehydration    Alcohol abuse with alcohol-induced mood disorder (HCC) 07/18/2018   Depression 05/26/2018   Anxiety state 04/25/2018   Need for immunization against influenza 04/25/2018   Alcohol withdrawal (HCC) 09/01/2017   DOE (dyspnea on exertion) 11/27/2016   Actinic keratosis 11/27/2016   Macrocytic anemia 11/23/2016   History of pulmonary embolus (PE) 11/02/2016   Hiatal hernia 09/14/2016   Skin cancer 08/13/2016   Poor social situation 06/09/2013   Tinea versicolor 06/08/2013   Back pain 05/07/2013   Seborrheic dermatitis of scalp 11/08/2012   HTN (hypertension) 04/11/2012   Alcohol use disorder, severe, dependence (HCC) 09/16/2006   PCP:  Pcp, No Pharmacy:   [facility] [phone] - [place], [place] - [address] AT [facility] [address] [place] [address] Phone: [phone] Fax: [phone]  [facility] [address] [place] [address] Phone: [phone] Fax: [phone]     Social Determinants of Health (SDOH) Social History: SDOH Screenings   Food Insecurity: Food Insecurity Present (12/11/2022)  Housing: High Risk (12/02/2022)  Transportation Needs: Unmet Transportation Needs (12/11/2022)  Utilities: Not At Risk (12/11/2022)  Recent Concern: Utilities - At Risk (12/02/2022)  Depression (PHQ2-9): Medium Risk (10/16/2020)  Tobacco Use: High Risk (02/04/2023)   SDOH Interventions: Food Insecurity Interventions: Inpatient TOC Housing Interventions: Inpatient TOC Transportation Interventions: Inpatient TOC   Readmission Risk Interventions    02/08/2023    5:25 PM 10/12/2022    1:29 PM 08/28/2022    2:00 PM  Readmission Risk Prevention Plan  Transportation Screening Complete Complete Complete  Medication Review Oceanographer)  Complete Complete   PCP or Specialist appointment within 3-5 days of discharge Complete Complete Complete  HRI or Home Care Consult Complete Complete   SW Recovery Care/Counseling Consult Complete Complete Complete  Palliative Care Screening Not Applicable Not Applicable Not Applicable  Skilled Nursing Facility Complete Not Applicable Not Applicable

## 2023-02-08 NOTE — [place] FL2 (Addendum)
[place] MEDICAID FL2 LEVEL OF CARE FORM     IDENTIFICATION  Patient Name: Anthony [name] Birthdate: 1955/08/05 Sex: male Admission Date (Current Location): 02/03/2023  [facility] and [place] Number:  Producer, television/film/video and Address:  [facility] [place], 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 difficile colitis 09/11/2022   Aspiration pneumonia (HCC) 08/27/2022   Aortic atherosclerosis (HCC) 08/27/2022   Depression, unspecified 08/09/2022   Hyponatremia 06/01/2022   Non compliance w medication regimen 05/31/2022   Delirium 05/25/2022   Stricture and stenosis of esophagus 05/21/2022   Gastritis and gastroduodenitis 05/21/2022   Loose stools 05/20/2022   Malnutrition of moderate degree 05/20/2022   Acute on chronic congestive heart failure (HCC)    Leukocytosis 05/19/2022   Sepsis (HCC) 05/19/2022   Cellulitis of left leg, possible 05/19/2022   Moniliasis, interdigital 05/19/2022   Open toe wound, right foot, medial fourth digit 05/19/2022   Malnutrition (HCC) 05/19/2022   Alcohol abuse  03/24/2022   Dyslipidemia 03/24/2022   Acute on chronic diastolic CHF (congestive heart failure) (HCC) 03/24/2022   Acute respiratory failure with hypoxia (HCC) 03/23/2022   Generalized weakness    Atrial fibrillation with RVR (HCC) 03/15/2022   Hypokalemia 03/15/2022   Decreased functional mobility and endurance 06/22/2021   Iron deficiency anemia 10/16/2020   Cough    Hypomagnesemia    Protein-calorie malnutrition, severe 08/22/2020   Alcohol use disorder, severe, in controlled environment (HCC)    Pressure injury of skin 08/18/2020   Urinary tract infection without hematuria    Severe sepsis (HCC) 08/14/2020   Paroxysmal atrial fibrillation (HCC) 08/13/2020   Rhabdomyolysis 07/22/2020   Cerebral ventriculomegaly 07/22/2020   Hemoglobin decreased 07/22/2020   History of delirium tremons 07/22/2020   Hypoalbuminemia due to protein-calorie malnutrition (HCC) 07/22/2020   Tobacco use disorder 07/22/2020   Alcohol withdrawal delirium (HCC)    Dehydration    Alcohol abuse with alcohol-induced mood disorder (HCC) 07/18/2018   Depression 05/26/2018   Anxiety state 04/25/2018   Need for immunization against influenza 04/25/2018   Alcohol withdrawal (HCC) 09/01/2017   DOE (dyspnea on exertion) 11/27/2016   Actinic keratosis 11/27/2016   Macrocytic anemia 11/23/2016   History of pulmonary embolus (PE) 11/02/2016   Hiatal hernia 09/14/2016   Skin cancer 08/13/2016   Poor social situation 06/09/2013   Tinea versicolor 06/08/2013   Back pain 05/07/2013   Seborrheic dermatitis of scalp 11/08/2012   HTN (hypertension) 04/11/2012   Alcohol use disorder, severe, dependence (HCC) 09/16/2006    Orientation RESPIRATION BLADDER Height & Weight     Self, Time, Place  Normal Incontinent Weight:  165 lb 12.6 oz (75.2 kg) Height:  5\' 9"  (175.3 cm)  BEHAVIORAL SYMPTOMS/MOOD NEUROLOGICAL BOWEL NUTRITION STATUS      Continent Diet (See dc summary)  AMBULATORY STATUS COMMUNICATION OF NEEDS  Skin   Limited Assist Verbally Normal                       Personal Care Assistance Level of Assistance  Bathing, Feeding, Dressing Bathing Assistance: Limited assistance Feeding assistance: Limited assistance Dressing Assistance: Limited assistance     Functional Limitations Info  Hearing   Hearing Info: Impaired      SPECIAL CARE FACTORS FREQUENCY  PT (By licensed PT), OT (By licensed OT)     PT Frequency: 5x/week OT Frequency: 5x/week            Contractures Contractures Info: Not present    Additional Factors Info  Code Status, Allergies Code Status Info: Full Allergies Info: Other, Poison Ivy Extract           Current Medications (02/08/2023):  This is the current hospital active medication list Current Facility-Administered Medications  Medication Dose Route Frequency Provider Last Rate Last Admin   (feeding supplement) PROSource Plus liquid 30 mL  30 mL Oral BID BM Leroy Sea, MD   30 mL at 02/08/23 1705   acetaminophen (TYLENOL) tablet 650 mg  650 mg Oral Q6H PRN Zigmund Daniel., MD       albuterol (PROVENTIL) (2.5 MG/3ML) 0.083% nebulizer solution 2.5 mg  2.5 mg Nebulization Q2H PRN Therisa Doyne, MD       amiodarone (PACERONE) tablet 200 mg  200 mg Oral BID Susa Raring K, MD   200 mg at 02/08/23 1015   [START ON 02/15/2023] amiodarone (PACERONE) tablet 200 mg  200 mg Oral Daily Leroy Sea, MD       apixaban Everlene Balls) tablet 10 mg  10 mg Oral BID Carollee Herter, DO   10 mg at 02/08/23 1013   Followed by   Melene Muller ON 02/13/2023] apixaban (ELIQUIS) tablet 5 mg  5 mg Oral BID Carollee Herter, DO       chlordiazePOXIDE (LIBRIUM) capsule 15 mg  15 mg Oral TID Leroy Sea, MD   15 mg at 02/08/23 1705   ferrous sulfate tablet 325 mg  325 mg Oral Q breakfast Doutova, Anastassia, MD   325 mg at 02/08/23 1013   folic acid (FOLVITE) tablet 1 mg  1 mg Oral Daily Doutova, Anastassia, MD   1 mg at 02/08/23 1014   guaiFENesin (MUCINEX) 12  hr tablet 600 mg  600 mg Oral BID Doutova, Anastassia, MD   600 mg at 02/08/23 1014   lidocaine (LIDODERM) 5 % 3 patch  3 patch Transdermal Q24H Zigmund Daniel., MD   3 patch at 02/07/23 0834   LORazepam (ATIVAN) tablet 1-4 mg  1-4 mg Oral Q1H PRN Leroy Sea, MD   2 mg at 02/08/23 1610   Or   LORazepam (ATIVAN) injection 1-4 mg  1-4 mg Intravenous Q1H PRN Leroy Sea, MD   2 mg at 02/07/23 2229   metoprolol tartrate (LOPRESSOR) tablet 25 mg  25 mg Oral BID Leroy Sea, MD   25 mg at 02/08/23 1014   midodrine (PROAMATINE) tablet 10 mg  10 mg Oral TID WC Doutova, Jonny Ruiz, MD   10 mg at 02/08/23 1705   mometasone-formoterol (DULERA) 200-5 MCG/ACT inhaler 2 puff  2 puff Inhalation BID Therisa Doyne, MD  2 puff at 02/07/23 1950   multivitamin with minerals tablet 1 tablet  1 tablet Oral Daily Doutova, Anastassia, MD   1 tablet at 02/08/23 1014   ondansetron (ZOFRAN) injection 4 mg  4 mg Intravenous Q6H PRN Doutova, Anastassia, MD       pantoprazole (PROTONIX) EC tablet 40 mg  40 mg Oral Daily Leroy Sea, MD   40 mg at 02/08/23 1014   polyethylene glycol (MIRALAX / GLYCOLAX) packet 17 g  17 g Oral BID Zigmund Daniel., MD   17 g at 02/08/23 1015   QUEtiapine (SEROQUEL) tablet 50 mg  50 mg Oral BID Leroy Sea, MD   50 mg at 02/08/23 1016   rosuvastatin (CRESTOR) tablet 10 mg  10 mg Oral Daily Doutova, Jonny Ruiz, MD   10 mg at 02/08/23 1014   thiamine (VITAMIN B1) 250 mg in sodium chloride 0.9 % 50 mL IVPB  250 mg Intravenous Daily Zigmund Daniel., MD 100 mL/hr at 02/08/23 1207 250 mg at 02/08/23 1207   Followed by   Melene Muller ON 02/12/2023] thiamine (VITAMIN B1) tablet 100 mg  100 mg Oral Daily Zigmund Daniel., MD       traMADol Janean Sark) tablet 50 mg  50 mg Oral Q6H PRN Leroy Sea, MD         Discharge Medications: Please see discharge summary for a list of discharge medications.  Relevant Imaging Results:  Relevant Lab  Results:   Additional Information [id number]  [name], LCSW

## 2023-02-08 NOTE — Progress Notes (Addendum)
Physical Therapy Treatment Patient Details Name: Anthony [name] MRN: [id number] DOB: February 22, 1956 Today's Date: 02/08/2023   History of Present Illness Anthony [name] is Anthony 67 y.o. male who presents with SOB. CT scans concerning for PE. PMH: alcohol abuse, housing insecurity, PE, depression, HTN, rhabdomyolysis, CVA.    PT Comments  Pt received in supine, lethargic/confused and not oriented to year, month, situation or location. Pt with very slow processing and needs multimodal cues to perform bed mobility, transfers and short gait trial in room with poor bil foot clearance and small shuffled steps. Pt needing minA and dense cues to initiate/perform all tasks. Pt sitting up in recliner and set up to eat dinner with assist needed to cut his meat at end of session, RN notified he may need some assist to eat safely given increased lethargy. Pt continues to benefit from PT services to progress toward functional mobility goals, continue to recommend low intensity post-acute rehab <3 hours per day given pt slow progress toward functional mobility goals and continued AMS.      Assistance Recommended at Discharge Frequent or constant Supervision/Assistance  If plan is discharge home, recommend the following:  Can travel by private vehicle    Anthony [name] help with walking and/or transfers;Anthony [name] help with bathing/dressing/bathroom;Assistance with cooking/housework;Assist for transportation;Help with stairs or ramp for entrance      Equipment Recommendations  Rollator (4 wheels)    Recommendations for Other Services       Precautions / Restrictions Precautions Precautions: Fall Precaution Comments: AMS, watch O2 Restrictions Weight Bearing Restrictions: No     Mobility  Bed Mobility Overal bed mobility: Needs Assistance Bed Mobility: Supine to Sit     Supine to sit: Min guard     General bed mobility comments: greatly increased time/cues to initiate due to lethargy    Transfers Overall  transfer level: Needs assistance Equipment used: Rolling walker (2 wheels) Transfers: Sit to/from Stand Sit to Stand: Min assist           General transfer comment: from EOB and to chair, dense safety cues needed.    Ambulation/Gait Ambulation/Gait assistance: Min assist Gait Distance (Feet): 20 Feet Assistive device: Rolling walker (2 wheels) Gait Pattern/deviations: Step-to pattern, Decreased dorsiflexion - left, Decreased dorsiflexion - right, Shuffle, Narrow base of support       General Gait Details: Very slow short shuffled steps, pt at times only stepping 1 or 2 inches forward at Anthony time, needs multimodal cues for upright posture and using BUE more to offload BLE to allow for improved foot clearance/increase step length. pt at times needing manual assist to step forward and tending to "freeze". Nearly festinating type gait pattern. HR WFL, SpO2 poor signal from fingers during trial and pt c/o shortness of breath with DOE 2/4. SpO2 checked on toe once in chair and WFL 99-100% on RA.   Stairs             Wheelchair Mobility     Tilt Bed    Modified Rankin (Stroke Patients Only)       Balance Overall balance assessment: Needs assistance Sitting-balance support: No upper extremity supported, Feet supported Sitting balance-Anthony [name] Scale: Good     Standing balance support: Bilateral upper extremity supported, During functional activity, Reliant on assistive device for balance Standing balance-Anthony [name] Scale: Poor Standing balance comment: heavy BUE reliance on RW, crouched posture and needs external assist to prevent LOB with turning/backward stepping  Cognition Arousal/Alertness: Lethargic Behavior During Therapy: Flat affect, Impulsive Overall Cognitive Status: Impaired/Different from baseline Area of Impairment: Memory, Following commands, Safety/judgement, Awareness, Attention, Orientation, Problem solving                  Orientation Level: Disoriented to, Time, Situation, Place Current Attention Level: Focused Memory: Decreased short-term memory Following Commands: Follows one step commands with increased time Safety/Judgement: Decreased awareness of safety, Decreased awareness of deficits Awareness: Intellectual Problem Solving: Slow processing, Decreased initiation, Difficulty sequencing, Requires verbal cues, Requires tactile cues General Comments: Anthony&O x1, very slow processing, confused compared with previous session. Pt received in bed wtih no gown on and trying to get OOB unassisted frequently today per unit secretary; he does not appear to be at his baseline.        Exercises      General Comments General comments (skin integrity, edema, etc.): HR 71-83 bpm, SpO2 99-100% on RA in chair      Pertinent Vitals/Pain Pain Assessment Pain Assessment: Faces Faces Pain Scale: Hurts Anthony [name] more Pain Location: generalized Pain Descriptors / Indicators: Grimacing Pain Intervention(s): Monitored during session, Limited activity within patient's tolerance, Repositioned    Home Living                          Prior Function            PT Goals (current goals can now be found in the care plan section) Acute Rehab PT Goals Patient Stated Goal: get stronger and safe housing PT Goal Formulation: With patient Time For Goal Achievement: 02/19/23 Progress towards PT goals: Progressing toward goals (slowly)    Frequency    Min 3X/week      PT Plan Current plan remains appropriate    Co-evaluation              AM-PAC PT "6 Clicks" Mobility   Outcome Measure  Help needed turning from your back to your side while in Anthony flat bed without using bedrails?: Anthony [name] Help needed moving from lying on your back to sitting on the side of Anthony flat bed without using bedrails?: Anthony [name] Help needed moving to and from Anthony bed to Anthony chair (including Anthony wheelchair)?: Anthony [name] Help needed  standing up from Anthony chair using your arms (e.g., wheelchair or bedside chair)?: Anthony [name] Help needed to walk in hospital room?: Anthony Lot (mod cues this date) Help needed climbing 3-5 steps with Anthony railing? : Total 6 Click Score: 15    End of Session Equipment Utilized During Treatment: Gait belt Activity Tolerance: Patient limited by lethargy Patient left: in chair;with call bell/phone within reach;with chair alarm set Nurse Communication: Mobility status;Other (comment) (lethargy) PT Visit Diagnosis: Other abnormalities of gait and mobility (R26.89);Muscle weakness (generalized) (M62.81);Difficulty in walking, not elsewhere classified (R26.2)     Time: 4098-1191 PT Time Calculation (min) (ACUTE ONLY): 34 min  Charges:    $Gait Training: 8-22 mins $Therapeutic Activity: 8-22 mins PT General Charges $$ ACUTE PT VISIT: 1 Visit                     Leray Garverick P., PTA Acute Rehabilitation Services Secure Chat Preferred 9a-5:30pm Office: [phone]    Dorathy Kinsman [facility] 02/08/2023, 5:21 PM

## 2023-02-08 NOTE — Plan of Care (Signed)

## 2023-02-08 NOTE — Progress Notes (Signed)
RE: Anthony [name] Date of Birth: 2056-06-11 Date: 02/08/23  Please be advised that the above-named patient will require a short-term nursing home stay - anticipated 30 days or less for rehabilitation and strengthening.  The plan is for return home.

## 2023-02-08 NOTE — Progress Notes (Signed)
PROGRESS NOTE                                                                                                                                                                                                             Patient Demographics:    Anthony [name], is a 67 y.o. male, DOB - Dec 15, 1955, JXB:[id number]  Outpatient Primary MD for the patient is Pcp, No    LOS - 4  Admit date - 02/03/2023    Chief Complaint  Patient presents with   Shortness of Breath   Hypoglycemia       Brief Narrative (HPI from H&P)   67 y.o. male with medical history significant of alcohol abuse, anxiety, HTN, homelessness, CVA who presented with chest pain and shortness of breath.  He was found to have a pulmonary embolism as well as atrial fibrillation with RVR and soft blood pressures, hyponatremic with edema, cardiology was consulted and he was admitted to the hospital.  Transferred to my care on 02/06/2023   Subjective:   Patient in bed, appears comfortable, denies any headache, no fever, no chest pain or pressure, no shortness of breath , no abdominal pain. No focal weakness.   Assessment  & Plan :    Acute Pulmonary Embolism with Infarct:  CT PE protocol with acute segmental and subsegmental PE in posterior RLL with associated developing pulmonary infarct in the lateral basal segment, negative lower extremity venous duplex.  Echo noted with reduction in RV systolic function.  Now transitioned from heparin drip to Eliquis.   Acute Fracture of L Lateral 3rd Rib with Associated Pulmonary Contusion:  Unchanged Comminuted Fracture of the L Clavicular Head and Subacute Fracture of the L Lateral 5th Rib, supportive care, PT OT, I-S for pulmonary toiletry.  Encouraged to sit in chair in daytime.  Monitor.  Atrial Fibrillation with RVR: Advised to score of at least 4.  Echo noted, discussed with cardiology on 02/06/2023 Dr. Jens Som, TSH stable currently  on amiodarone which will be continued, Eliquis.  Acute on chronic systolic left-sided heart failure along with RV systolic dysfunction as well.  Hold further fluids, diuresis as tolerated by blood pressure and monitor.  Blood pressure too low for ACE/ARB, low-dose beta-blocker if tolerated.    New right-sided pleural effusion.  Seen by pulmonary, repeat CT checked on 02/06/2023, does not look  like hemothorax, underwent bedside ultrasound-guided thoracentesis on 02/06/2023 fluid appears to be transudative, outpatient pulmonary follow-up postdischarge.  Will recheck chest x-ray on 02/07/2023.  Hyponatremia  - Has bilateral LE edema on exam, further IV fluids and diurese with Lasix and monitor   Non anion gap metabolic acidosis - Likely contraction alkalosis from intravascular depletion, third spacing of fluids and to some extent lactic acidosis.  Has been adequately hydrated.  Add protein supplements, diurese, this problem has resolved.   Etoh Abuse  Drinks 6 pack daily consult to quit, on Librium along with CIWA protocol.   Metabolic encephalopathy.  Getting worse at nighttime likely due to hospital delirium, ripping IVs, has had 10 IVs placed so far, placed on Seroquel twice daily, QTc on telemetry on 02/08/2023 is 420 ms.    Dyslipidemia  Crestor   Hypomagnesemia   Replaced, Continue to monitor   Chronic hypotension   blood pressure gradually improving, he is chronically on midodrine which will be continued   Hypoglycemia  Due to poor oral intake, stable random TSH and a.m. cortisol, monitor CBGs.        Condition - Extremely Guarded  Family Communication  :  None  Code Status :  Full  Consults  :  PCCM, Cards  PUD Prophylaxis :  PPI   Procedures  :     Echocardiogram.   1. Left ventricular ejection fraction, by estimation, is 40 to 45%. The left ventricle has mildly decreased function. The left ventricle demonstrates regional wall motion abnormalities (see scoring diagram/findings  for description). Left ventricular diastolic function could not be evaluated. There is global LV hypokinesis with akinesis of the basilar to mid inferior wall.  2. Right ventricular systolic function is severely reduced. The right ventricular size is moderately enlarged. Tricuspid regurgitation signal is inadequate for assessing PA pressure.  3. Left atrial size was severely dilated.  4. Right atrial size was severely dilated.  5. The mitral valve is thickened and the posterior leaflet is restricted there is moderate to severe central MR. The mitral valve is abnormal. Moderate to severe mitral valve regurgitation. No evidence of mitral stenosis.  6. The aortic valve is tricuspid. There is mild calcification of the aortic valve. Aortic valve regurgitation is not visualized. Aortic valve sclerosis/calcification is present, without any evidence of aortic stenosis.  7. The inferior vena cava is dilated in size with <50% respiratory variability, suggesting right atrial pressure of 15 mmHg.   CT head.  Nonacute.    CT chest repeat noncontrast on 02/06/2023.  1. New moderate right and small left pleural effusions with associated lower lobe consolidation, right greater than left. 2. Left rib fractures with interval increase in subpleural contusion in the anterolateral left upper lobe. 3. Stable mediastinal adenopathy. 4. New small volume perisplenic fluid. 5. Coronary and aortic Atherosclerosis (ICD10-I70.0).  CTA chest. 02/03/23 -  1. Acute segmental and subsegmental pulmonary emboli in the posterior right lower lobe with associated developing pulmonary infarct in the lateral basal segment. No evidence of acute right heart strain. 2. Acute fracture of the left lateral third rib with associated pulmonary contusion in the left upper lobe. No pneumothorax. 3. Unchanged comminuted fracture of the left clavicular head and subacute fracture of the left lateral fifth rib. 4. No acute findings in the abdomen or pelvis. 5.  Colonic diverticulosis without evidence of acute diverticulitis. Aortic Atherosclerosis (ICD10-I70.0)    CT abdomen pelvis.  1. Acute segmental and subsegmental pulmonary emboli in the posterior right lower lobe  with associated developing pulmonary infarct in the lateral basal segment. No evidence of acute right heart strain. 2. Acute fracture of the left lateral third rib with associated pulmonary contusion in the left upper lobe. No pneumothorax. 3. Unchanged comminuted fracture of the left clavicular head and subacute fracture of the left lateral fifth rib. 4. No acute findings in the abdomen or pelvis. 5. Colonic diverticulosis without evidence of acute diverticulitis. Aortic Atherosclerosis  Lower extremity venous duplex. No DVT      Disposition Plan  :    Status is: Inpatient  DVT Prophylaxis  :  Heparin gtt  Lab Results  Component Value Date   PLT 283 02/08/2023    Diet :  Diet Order             Diet regular Room service appropriate? Yes; Fluid consistency: Thin; Fluid restriction: 1200 mL Fluid  Diet effective now                    Inpatient Medications  Scheduled Meds:  (feeding supplement) PROSource Plus  30 mL Oral BID BM   amiodarone  200 mg Oral BID   [START ON 02/15/2023] amiodarone  200 mg Oral Daily   apixaban  10 mg Oral BID   Followed by   Melene Muller ON 02/13/2023] apixaban  5 mg Oral BID   chlordiazePOXIDE  15 mg Oral TID   ferrous sulfate  325 mg Oral Q breakfast   folic acid  1 mg Oral Daily   guaiFENesin  600 mg Oral BID   lidocaine  3 patch Transdermal Q24H   metoprolol tartrate  25 mg Oral BID   midodrine  10 mg Oral TID WC   mometasone-formoterol  2 puff Inhalation BID   multivitamin with minerals  1 tablet Oral Daily   pantoprazole  40 mg Oral Daily   polyethylene glycol  17 g Oral BID   QUEtiapine  50 mg Oral BID   rosuvastatin  10 mg Oral Daily   [START ON 02/12/2023] thiamine  100 mg Oral Daily   Continuous Infusions:  thiamine (VITAMIN  B1) injection 250 mg (02/07/23 1104)   PRN Meds:.[COMPLETED] acetaminophen **FOLLOWED BY** acetaminophen, albuterol, LORazepam **OR** LORazepam, [DISCONTINUED] ondansetron **OR** ondansetron (ZOFRAN) IV, traMADol  Antibiotics  :    Anti-infectives (From admission, onward)    None         Objective:   Vitals:   02/08/23 0343 02/08/23 0500 02/08/23 0524 02/08/23 0800  BP:   105/83 (!) 116/96  Pulse: 97  89   Resp:   12 19  Temp:   (!) 97.3 F (36.3 C)   TempSrc:      SpO2: 93%  (!) 76%   Weight:  75.2 kg    Height:        Wt Readings from Last 3 Encounters:  02/08/23 75.2 kg  01/06/23 63.5 kg  12/15/22 67.4 kg     Intake/Output Summary (Last 24 hours) at 02/08/2023 0930 Last data filed at 02/08/2023 0700 Gross per 24 hour  Intake 600 ml  Output 1300 ml  Net -700 ml     Physical Exam  Awake Alert, No new F.N deficits, Normal affect [place].AT,PERRAL Supple Neck, No JVD,   Symmetrical Chest wall movement, moderate air movement bilaterally, reduced right lower lobe lung sounds, trace leg edema, RRR,No Gallops,Rubs or new Murmurs,  +ve B.Sounds, Abd Soft, No tenderness,   No Cyanosis, Clubbing or edema     Data Review:  Recent Labs  Lab 02/03/23 1313 02/03/23 1405 02/04/23 0051 02/04/23 0226 02/05/23 0744 02/06/23 0326 02/07/23 0641 02/08/23 0309  WBC 4.6  --  5.0  --  5.1 4.0 4.5 6.0  HGB 9.6*   < > 9.6* 10.9* 9.1* 8.5* 8.8* 9.9*  HCT 28.6*   < > 28.8* 32.0* 27.4* 25.5* 26.5* 30.3*  PLT 216  --  236  --  201 223 223 283  MCV 101.1*  --  101.8*  --  100.7* 100.0 99.6 104.5*  MCH 33.9  --  33.9  --  33.5 33.3 33.1 34.1*  MCHC 33.6  --  33.3  --  33.2 33.3 33.2 32.7  RDW 21.3*  --  21.5*  --  22.5* 22.5* 22.9* 23.4*  LYMPHSABS 1.3  --   --   --   --   --  1.3 1.5  MONOABS 0.6  --   --   --   --   --  0.5 0.7  EOSABS 0.1  --   --   --   --   --  0.0 0.0  BASOSABS 0.1  --   --   --   --   --  0.0 0.0   < > = values in this interval not displayed.     Recent Labs  Lab 02/03/23 1313 02/03/23 1314 02/03/23 1405 02/04/23 0051 02/04/23 0226 02/04/23 1259 02/04/23 1827 02/04/23 1828 02/05/23 0744 02/06/23 0326 02/07/23 0641 02/08/23 0309  NA 123*  --    < > 121*   < > 121*  --  123* 124*  --  131* 134*  K 3.7  --    < > 4.7   < > 4.1  --  3.9 3.6  --  3.5 4.4  CL 96*  --    < > 93*   < > 97*  --  98 100  --  105 105  CO2 14*  --    < > 12*   < > 15*  --  14* 15*  --  17* 18*  ANIONGAP 13  --    < > 16*   < > 9  --  11 9  --  9 11  GLUCOSE 86  --    < > 133*   < > 123*  --  114* 122*  --  97 92  BUN 8  --    < > 6*   < > 8  --  7* 9  --  14 17  CREATININE 1.01  --    < > 0.82   < > 0.81  --  0.75 0.87  --  1.07 1.16  AST 50*  --   --  49*  45*  --   --   --   --   --   --  34 47*  ALT 23  --   --  23  23  --   --   --   --   --   --  20 24  ALKPHOS 138*  --   --  140*  137*  --   --   --   --   --   --  136* 172*  BILITOT 1.4*  --   --  1.2  0.9  --   --   --   --   --   --  0.5 0.6  ALBUMIN 3.3*  --   --  2.9*  2.8*  --   --   --   --   --   --  2.6* 2.7*  LATICACIDVEN  --   --   --  3.3*  --   --  2.0*  --   --   --   --   --   TSH  --   --   --  5.143*  --   --   --   --   --  2.795  --   --   HGBA1C  --   --   --   --   --   --   --   --   --  5.2  --   --   AMMONIA  --   --   --  32  --   --   --   --   --   --   --   --   BNP  --  817.0*  --  1,077.8*  --   --   --   --   --  1,388.1* 1,655.5* 1,747.4*  MG 1.2*  --   --  1.3*  1.3*  --   --   --   --   --   --  1.4* 2.2  CALCIUM 8.0*  --    < > 7.5*   < > 7.0*  --  7.1* 7.0*  --  7.5* 8.0*   < > = values in this interval not displayed.      Recent Labs  Lab 02/03/23 1313 02/03/23 1314 02/03/23 2252 02/04/23 0051 02/04/23 0300 02/04/23 1259 02/04/23 1827 02/04/23 1828 02/05/23 0744 02/06/23 0326 02/07/23 0641 02/08/23 0309  LATICACIDVEN  --   --   --  3.3*  --   --  2.0*  --   --   --   --   --   TSH  --   --   --  5.143*  --   --   --   --   --   2.795  --   --   HGBA1C  --   --   --   --   --   --   --   --   --  5.2  --   --   AMMONIA  --   --   --  32  --   --   --   --   --   --   --   --   BNP  --  817.0*  --  1,077.8*  --   --   --   --   --  1,388.1* 1,655.5* 1,747.4*  MG 1.2*  --   --  1.3*  1.3*  --   --   --   --   --   --  1.4* 2.2  CALCIUM 8.0*  --    < > 7.5*   < > 7.0*  --  7.1* 7.0*  --  7.5* 8.0*   < > = values in this interval not displayed.    Recent Labs  Lab 02/04/23 0051 02/04/23 0300 02/04/23 1259 02/04/23 1827 02/04/23 1828 02/05/23 0744 02/06/23 0326 02/07/23 0641 02/08/23 0309  WBC 5.0  --   --   --   --  5.1 4.0 4.5 6.0  PLT 236  --   --   --   --  201 223 223 283  LATICACIDVEN 3.3*  --   --  2.0*  --   --   --   --   --   CREATININE 0.82   < > 0.81  --  0.75 0.87  --  1.07 1.16   < > = values in this interval not displayed.    ------------------------------------------------------------------------------------------------------------------ Lab Results  Component Value Date   CHOL 121 12/04/2022   HDL 60 12/04/2022   LDLCALC 48 12/04/2022   TRIG 63 12/04/2022   CHOLHDL 2.0 12/04/2022    Lab Results  Component Value Date   HGBA1C 5.2 02/06/2023    Recent Labs    02/06/23 0326  TSH 2.795   ------------------------------------------------------------------------------------------------------------------ Cardiac Enzymes No results for input(s): "CKMB", "TROPONINI", "MYOGLOBIN" in the last 168 hours.  Invalid input(s): "CK"  Micro Results Recent Results (from the past 240 hour(s))  Resp panel by RT-PCR (RSV, Flu A&B, Covid) Anterior Nasal Swab     Status: None   Collection Time: 02/03/23  2:39 PM   Specimen: Anterior Nasal Swab  Result Value Ref Range Status   SARS Coronavirus 2 by RT PCR NEGATIVE NEGATIVE Final   Influenza A by PCR NEGATIVE NEGATIVE Final   Influenza B by PCR NEGATIVE NEGATIVE Final    Comment: (NOTE) The Xpert Xpress SARS-CoV-2/FLU/RSV plus assay is  intended as an aid in the diagnosis of influenza from Nasopharyngeal swab specimens and should not be used as a sole basis for treatment. Nasal washings and aspirates are unacceptable for Xpert Xpress SARS-CoV-2/FLU/RSV testing.  Fact Sheet for Patients: BloggerCourse.com  Fact Sheet for Healthcare Providers: SeriousBroker.it  This test is not yet approved or cleared by the [place] FDA and has been authorized for detection and/or diagnosis of SARS-CoV-2 by FDA under an Emergency Use Authorization (EUA). This EUA will remain in effect (meaning this test can be used) for the duration of the COVID-19 declaration under Section 564(b)(1) of the Act, 21 U.S.C. section 360bbb-3(b)(1), unless the authorization is terminated or revoked.     Resp Syncytial Virus by PCR NEGATIVE NEGATIVE Final    Comment: (NOTE) Fact Sheet for Patients: BloggerCourse.com  Fact Sheet for Healthcare Providers: SeriousBroker.it  This test is not yet approved or cleared by the [place] FDA and has been authorized for detection and/or diagnosis of SARS-CoV-2 by FDA under an Emergency Use Authorization (EUA). This EUA will remain in effect (meaning this test can be used) for the duration of the COVID-19 declaration under Section 564(b)(1) of the Act, 21 U.S.C. section 360bbb-3(b)(1), unless the authorization is terminated or revoked.  Performed at [facility], [address] [place], [place] [address]   Culture, body fluid w Gram Stain-bottle     Status: None (Preliminary result)   Collection Time: 02/06/23 12:00 PM   Specimen: Pleura  Result Value Ref Range Status   Specimen Description PLEURAL  Final   Special Requests NONE  Final   Culture   Final    NO GROWTH 2 DAYS Performed at [facility], [address] [place], [place] [address]    Report Status PENDING  Incomplete   Gram stain     Status: None   Collection Time: 02/06/23 12:00 PM   Specimen: Pleura  Result Value Ref Range Status   Specimen Description PLEURAL  Final   Special Requests NONE  Final   Gram Stain   Final    FEW WBC SEEN NO ORGANISMS SEEN Performed at [facility], [address] [place], [place] [address]    Report  Status 02/06/2023 FINAL  Final    Radiology Reports DG Chest Port 1 View  Result Date: 02/07/2023 CLINICAL DATA:  Shortness of breath EXAM: PORTABLE CHEST 1 VIEW COMPARISON:  Chest radiograph dated 02/06/2023. FINDINGS: The heart is enlarged. Vascular calcifications are seen in the aortic arch. There is a small to moderate right pleural effusion with associated atelectasis/airspace disease, slightly increased compared to prior exam. A left pleural effusion is difficult to exclude. There is left basilar atelectasis/airspace disease. IMPRESSION: Small to moderate right pleural effusion with associated atelectasis/airspace disease, slightly increased compared to prior exam. Electronically Signed   By: Romona Curls M.D.   On: 02/07/2023 15:34   DG Chest Port 1 View  Result Date: 02/06/2023 CLINICAL DATA:  Post thoracentesis. EXAM: PORTABLE CHEST 1 VIEW COMPARISON:  CT and radiographs 02/06/2023. FINDINGS: 1135 hours. The heart size and mediastinal contours are stable with mild aortic atherosclerosis. Right pleural effusion has decreased in volume following presumed right-sided thoracentesis. No evidence of pneumothorax. A small left pleural effusion appears unchanged. There is interval improved aeration of the right lung. Patchy left basilar pulmonary opacity appears stable. No acute osseous findings are seen; known left-sided rib fractures are not well visualized. Telemetry leads overlie the chest. IMPRESSION: 1. Decreased right pleural effusion following thoracentesis. No evidence of pneumothorax. 2. Stable small left pleural effusion and patchy left basilar pulmonary  opacity. Electronically Signed   By: Carey Bullocks M.D.   On: 02/06/2023 12:02   CT CHEST WO CONTRAST  Result Date: 02/06/2023 CLINICAL DATA:  Pneumonia, complication suspected, xray done EXAM: CT CHEST WITHOUT CONTRAST TECHNIQUE: Multidetector CT imaging of the chest was performed following the standard protocol without IV contrast. RADIATION DOSE REDUCTION: This exam was performed according to the departmental dose-optimization program which includes automated exposure control, adjustment of the mA and/or kV according to patient size and/or use of iterative reconstruction technique. COMPARISON:  CTA 02/03/2023 FINDINGS: Cardiovascular: Coronary and scattered aortic calcifications. Mild four-chamber cardiac enlargement. No pericardial effusion. Mediastinum/Nodes: No mediastinal hematoma or mass. 1.2 cm subcarinal node, previously 1.4 on 01/06/2023. 1.4 cm right paratracheal node, previously 1.3. Additional scattered subcentimeter prominent pre-vascular, and AP window lymph nodes stable. No definite hilar adenopathy. Postop changes at the GE junction with regional streaky changes in the posterior mediastinal fat, stable since 01/06/2023. Lungs/Pleura: New moderate right pleural effusion. New small left pleural effusion. No pneumothorax. Fairly dense consolidation throughout most of the right lower lobe, and in the posterior dependent aspect of the left lower lobe. Subpleural ground-glass opacities in the anterolateral left upper lobe have slightly increased since previous exam Upper Abdomen: Surgical clips left upper quadrant. Small volume perisplenic fluid, new since prior study. Layering hyperdensity in the gallbladder possibly vicarious excretion of previously administered IV contrast material. Musculoskeletal: Minimally displaced fracture deformities of left ribs 3 and 5 as before. Comminuted left clavicle fracture as before. Bilateral acromioclavicular and glenohumeral degenerative change. IMPRESSION: 1.  New moderate right and small left pleural effusions with associated lower lobe consolidation, right greater than left. 2. Left rib fractures with interval increase in subpleural contusion in the anterolateral left upper lobe. 3. Stable mediastinal adenopathy. 4. New small volume perisplenic fluid. 5. Coronary and aortic Atherosclerosis (ICD10-I70.0). Electronically Signed   By: Corlis Leak M.D.   On: 02/06/2023 09:24   DG Chest Port 1 View  Result Date: 02/06/2023 CLINICAL DATA:  Shortness of breath EXAM: PORTABLE CHEST 1 VIEW COMPARISON:  Abdominal CT from 3 days ago FINDINGS: Sizable right pleural effusion  with leftward displacement of the mediastinum. Underlying pulmonary opacity in the setting of pulmonary embolism. Cardiomegaly. No pneumothorax. Chronic fracture at the lateral right clavicle. Prelim sent in epic chat. IMPRESSION: Large right pleural effusion since chest CT 3 days ago. Consider updated CT. Electronically Signed   By: Tiburcio Pea M.D.   On: 02/06/2023 06:58   VAS [place] LOWER EXTREMITY VENOUS (DVT)  Result Date: 02/04/2023  Lower Venous DVT Study Patient Name:  Lc A Whitenight  Date of Exam:   02/04/2023 Medical Rec #: [id number]     Accession #:    [id number] Date of Birth: [birth year]     Patient Gender: M Patient Age:   38 years Exam Location:  [facility] Procedure:      VAS [place] LOWER EXTREMITY VENOUS (DVT) Referring Phys: Jonny Ruiz DOUTOVA --------------------------------------------------------------------------------  Indications: Pulmonary embolism, and Swelling.  Comparison Study: Prior negative bilateral LEV done 11/04/16, prior negative left                   LEV done 05/19/22 Performing Technologist: Sherren Kerns RVS  Examination Guidelines: A complete evaluation includes B-mode imaging, spectral Doppler, color Doppler, and power Doppler as needed of all accessible portions of each vessel. Bilateral testing is considered an integral part of a complete examination. Limited  examinations for reoccurring indications may be performed as noted. The reflux portion of the exam is performed with the patient in reverse Trendelenburg.  +--------+---------------+---------+-----------+----------------+-------------+ RIGHT   CompressibilityPhasicitySpontaneityProperties      Thrombus                                                                 Aging         +--------+---------------+---------+-----------+----------------+-------------+ CFV     Full                               pulsatile                                                                waveforms                     +--------+---------------+---------+-----------+----------------+-------------+ SFJ     Full                                                             +--------+---------------+---------+-----------+----------------+-------------+ FV Prox Full                                                             +--------+---------------+---------+-----------+----------------+-------------+ FV Mid  Full                                                             +--------+---------------+---------+-----------+----------------+-------------+  FV      Full                                                             Distal                                                                   +--------+---------------+---------+-----------+----------------+-------------+ PFV     Full                                                             +--------+---------------+---------+-----------+----------------+-------------+ POP     Full                               pulsatile                                                                waveforms                     +--------+---------------+---------+-----------+----------------+-------------+ PTV     Full                                                              +--------+---------------+---------+-----------+----------------+-------------+ PERO    Full                                                             +--------+---------------+---------+-----------+----------------+-------------+   +---------+---------------+---------+-----------+---------------+-------------+ LEFT     CompressibilityPhasicitySpontaneityProperties     Thrombus                                                                 Aging         +---------+---------------+---------+-----------+---------------+-------------+ CFV      Full                               pulsatile  waveforms                    +---------+---------------+---------+-----------+---------------+-------------+ SFJ      Full                                                            +---------+---------------+---------+-----------+---------------+-------------+ FV Prox  Full                                                            +---------+---------------+---------+-----------+---------------+-------------+ FV Mid   Full                                                            +---------+---------------+---------+-----------+---------------+-------------+ FV DistalFull                                                            +---------+---------------+---------+-----------+---------------+-------------+ PFV      Full                                                            +---------+---------------+---------+-----------+---------------+-------------+ POP      Full                               pulsatile                                                                waveforms                    +---------+---------------+---------+-----------+---------------+-------------+ PTV      Full                                                             +---------+---------------+---------+-----------+---------------+-------------+ PERO     Full                                                            +---------+---------------+---------+-----------+---------------+-------------+  Soleal   Full                                                            +---------+---------------+---------+-----------+---------------+-------------+     Summary: BILATERAL: - No evidence of deep vein thrombosis seen in the lower extremities, bilaterally. -No evidence of popliteal cyst, bilaterally.   *See table(s) above for measurements and observations. Electronically signed by Heath Lark on 02/04/2023 at 2:15:34 PM.    Final    ECHOCARDIOGRAM COMPLETE  Result Date: 02/04/2023    ECHOCARDIOGRAM REPORT   Patient Name:   Anthony [name] Date of Exam: 02/04/2023 Medical Rec #:  [id number]    Height:       69.0 in Accession #:    [id number]   Weight:       150.0 lb Date of Birth:  1956-04-09    BSA:          1.828 m Patient Age:    67 years     BP:           115/85 mmHg Patient Gender: M            HR:           105 bpm. Exam Location:  Inpatient Procedure: 2D Echo, Cardiac Doppler and Color Doppler Indications:    pulmonary embolus  History:        Patient has prior history of Echocardiogram examinations, most                 recent 08/28/2022. Arrythmias:Atrial Fibrillation; Risk                 Factors:Hypertension, Dyslipidemia and Alcohol abuse.  Sonographer:    Delcie Roch RDCS Referring Phys: [id number] ANASTASSIA DOUTOVA IMPRESSIONS  1. Left ventricular ejection fraction, by estimation, is 40 to 45%. The left ventricle has mildly decreased function. The left ventricle demonstrates regional wall motion abnormalities (see scoring diagram/findings for description). Left ventricular diastolic function could not be evaluated. There is global LV hypokinesis with akinesis of the basilar to mid inferior wall.  2. Right ventricular systolic function is severely reduced. The  right ventricular size is moderately enlarged. Tricuspid regurgitation signal is inadequate for assessing PA pressure.  3. Left atrial size was severely dilated.  4. Right atrial size was severely dilated.  5. The mitral valve is thickened and the posterior leaflet is restricted there is moderate to severe central MR. The mitral valve is abnormal. Moderate to severe mitral valve regurgitation. No evidence of mitral stenosis.  6. The aortic valve is tricuspid. There is mild calcification of the aortic valve. Aortic valve regurgitation is not visualized. Aortic valve sclerosis/calcification is present, without any evidence of aortic stenosis.  7. The inferior vena cava is dilated in size with <50% respiratory variability, suggesting right atrial pressure of 15 mmHg. FINDINGS  Left Ventricle: Left ventricular ejection fraction, by estimation, is 40 to 45%. The left ventricle has mildly decreased function. The left ventricle demonstrates regional wall motion abnormalities. The left ventricular internal cavity size was normal in size. There is no left ventricular hypertrophy. Left ventricular diastolic function could not be evaluated due to atrial fibrillation. Left ventricular diastolic function could not be evaluated.  LV Wall Scoring: The inferior wall is akinetic. Right Ventricle: The right ventricular size  is moderately enlarged. No increase in right ventricular wall thickness. Right ventricular systolic function is severely reduced. Tricuspid regurgitation signal is inadequate for assessing PA pressure. The tricuspid regurgitant velocity is 2.13 m/s, and with an assumed right atrial pressure of 8 mmHg, the estimated right ventricular systolic pressure is 26.1 mmHg. Left Atrium: Left atrial size was severely dilated. Right Atrium: Right atrial size was severely dilated. Pericardium: There is no evidence of pericardial effusion. Mitral Valve: The mitral valve is thickened and the posterior leaflet is restricted there  is moderate to severe central MR. The mitral valve is abnormal. There is moderate thickening of the mitral valve leaflet(s). There is moderate calcification of the mitral valve leaflet(s). Moderate to severe mitral valve regurgitation. No evidence of mitral valve stenosis. Tricuspid Valve: The tricuspid valve is normal in structure. Tricuspid valve regurgitation is not demonstrated. No evidence of tricuspid stenosis. Aortic Valve: The aortic valve is tricuspid. There is mild calcification of the aortic valve. Aortic valve regurgitation is not visualized. Aortic valve sclerosis/calcification is present, without any evidence of aortic stenosis. Pulmonic Valve: The pulmonic valve was normal in structure. Pulmonic valve regurgitation is not visualized. No evidence of pulmonic stenosis. Aorta: The aortic root is normal in size and structure. Venous: The inferior vena cava is dilated in size with less than 50% respiratory variability, suggesting right atrial pressure of 15 mmHg. IAS/Shunts: No atrial level shunt detected by color flow Doppler.  LEFT VENTRICLE PLAX 2D LVIDd:         4.10 cm LVIDs:         3.80 cm LV PW:         0.80 cm LV IVS:        0.70 cm LVOT diam:     2.10 cm LVOT Area:     3.46 cm  RIGHT VENTRICLE          IVC RV Basal diam:  3.40 cm  IVC diam: 2.30 cm TAPSE (M-mode): 1.4 cm LEFT ATRIUM              Index        RIGHT ATRIUM           Index LA diam:        4.40 cm  2.41 cm/m   RA Area:     20.40 cm LA Vol (A2C):   100.0 ml 54.70 ml/m  RA Volume:   58.50 ml  32.00 ml/m LA Vol (A4C):   76.6 ml  41.90 ml/m LA Biplane Vol: 88.6 ml  48.46 ml/m   AORTA Ao Root diam: 3.30 cm Ao Asc diam:  3.30 cm TRICUSPID VALVE TR Peak grad:   18.1 mmHg TR Vmax:        213.00 cm/s  SHUNTS Systemic Diam: 2.10 cm Arvilla Meres MD Electronically signed by Arvilla Meres MD Signature Date/Time: 02/04/2023/10:27:44 AM    Final       Signature  -   Susa Raring M.D on 02/08/2023 at 9:30 AM   -  To page go to  [email or web address]

## 2023-02-09 DIAGNOSIS — I2694 Multiple subsegmental pulmonary emboli without acute cor pulmonale: Secondary | ICD-10-CM | POA: Diagnosis not present

## 2023-02-09 LAB — COMPREHENSIVE METABOLIC PANEL
ALT: 22 U/L (ref 0–44)
AST: 40 U/L (ref 15–41)
Albumin: 2.6 g/dL — ABNORMAL LOW (ref 3.5–5.0)
Alkaline Phosphatase: 146 U/L — ABNORMAL HIGH (ref 38–126)
Anion gap: 8 (ref 5–15)
BUN: 17 mg/dL (ref 8–23)
CO2: 17 mmol/L — ABNORMAL LOW (ref 22–32)
Calcium: 8.1 mg/dL — ABNORMAL LOW (ref 8.9–10.3)
Chloride: 110 mmol/L (ref 98–111)
Creatinine, Ser: 0.99 mg/dL (ref 0.61–1.24)
GFR, Estimated: >60 mL/min (ref >60)
Glucose, Bld: 86 mg/dL (ref 70–99)
Potassium: 4.4 mmol/L (ref 3.5–5.1)
Sodium: 135 mmol/L (ref 135–145)
Total Bilirubin: 0.7 mg/dL (ref 0.3–1.2)
Total Protein: 5.9 g/dL — ABNORMAL LOW (ref 6.5–8.1)

## 2023-02-09 LAB — CBC WITH DIFFERENTIAL/PLATELET
Abs Immature Granulocytes: 0.02 10*3/uL (ref 0.00–0.07)
Basophils Absolute: 0 10*3/uL (ref 0.0–0.1)
Basophils Relative: 1 %
Eosinophils Absolute: 0.1 10*3/uL (ref 0.0–0.5)
Eosinophils Relative: 2 %
HCT: 27.1 % — ABNORMAL LOW (ref 39.0–52.0)
Hemoglobin: 8.9 g/dL — ABNORMAL LOW (ref 13.0–17.0)
Immature Granulocytes: 0 %
Lymphocytes Relative: 32 %
Lymphs Abs: 1.6 10*3/uL (ref 0.7–4.0)
MCH: 34.4 pg — ABNORMAL HIGH (ref 26.0–34.0)
MCHC: 32.8 g/dL (ref 30.0–36.0)
MCV: 104.6 fL — ABNORMAL HIGH (ref 80.0–100.0)
Monocytes Absolute: 0.6 10*3/uL (ref 0.1–1.0)
Monocytes Relative: 11 %
Neutro Abs: 2.6 10*3/uL (ref 1.7–7.7)
Neutrophils Relative %: 54 %
Platelets: 253 10*3/uL (ref 150–400)
RBC: 2.59 MIL/uL — ABNORMAL LOW (ref 4.22–5.81)
RDW: 23.3 % — ABNORMAL HIGH (ref 11.5–15.5)
WBC: 4.9 10*3/uL (ref 4.0–10.5)
nRBC: 0 % (ref 0.0–0.2)

## 2023-02-09 LAB — MAGNESIUM: Magnesium: 1.7 mg/dL (ref 1.7–2.4)

## 2023-02-09 LAB — BRAIN NATRIURETIC PEPTIDE: B Natriuretic Peptide: 2274.2 pg/mL — ABNORMAL HIGH (ref 0.0–100.0)

## 2023-02-09 LAB — CYTOLOGY - NON PAP

## 2023-02-09 MED ORDER — FUROSEMIDE 10 MG/ML IJ SOLN
20.0000 mg | Freq: Once | INTRAMUSCULAR | Status: AC
  Administered 2023-02-09: 20 mg via INTRAVENOUS
  Filled 2023-02-09: qty 2

## 2023-02-09 MED ORDER — CHLORDIAZEPOXIDE HCL 5 MG PO CAPS
10.0000 mg | ORAL_CAPSULE | Freq: Three times a day (TID) | ORAL | Status: DC
  Administered 2023-02-09 – 2023-02-10 (×4): 10 mg via ORAL
  Filled 2023-02-09 (×5): qty 2

## 2023-02-09 NOTE — Progress Notes (Signed)
PROGRESS NOTE                                                                                                                                                                                                             Patient Demographics:    Anthony [name], is a 67 y.o. male, DOB - [birth year], UJW:[id number]  Outpatient Primary MD for the patient is Pcp, No    LOS - 5  Admit date - 02/03/2023    Chief Complaint  Patient presents with   Shortness of Breath   Hypoglycemia       Brief Narrative (HPI from H&P)   67 y.o. male with medical history significant of alcohol abuse, anxiety, HTN, homelessness, CVA who presented with chest pain and shortness of breath.  He was found to have a pulmonary embolism as well as atrial fibrillation with RVR and soft blood pressures, hyponatremic with edema, cardiology was consulted and he was admitted to the hospital.  Transferred to my care on 02/06/2023   Subjective:   Patient in bed, appears comfortable, denies any headache, no fever, no chest pain or pressure, no shortness of breath , no abdominal pain. No focal weakness.   Assessment  & Plan :    Acute Pulmonary Embolism with Infarct:  CT PE protocol with acute segmental and subsegmental PE in posterior RLL with associated developing pulmonary infarct in the lateral basal segment, negative lower extremity venous duplex.  Echo noted with reduction in RV systolic function.  Now transitioned from heparin drip to Eliquis.   Acute Fracture of L Lateral 3rd Rib with Associated Pulmonary Contusion:  Unchanged Comminuted Fracture of the L Clavicular Head and Subacute Fracture of the L Lateral 5th Rib, supportive care, PT OT, I-S for pulmonary toiletry.  Encouraged to sit in chair in daytime.  Monitor.  Atrial Fibrillation with RVR: Advised to score of at least 4.  Echo noted, discussed with cardiology on 02/06/2023 Dr. Jens Som, TSH stable currently  on amiodarone which will be continued, Eliquis.  Acute on chronic systolic left-sided heart failure along with RV systolic dysfunction as well.  Hold further fluids, diurese as tolerated by blood pressure and monitor.  Blood pressure too low for ACE/ARB/Entresto, low-dose beta-blocker if tolerated.  Seen by cardiology this admission nothing else to offer.  New right-sided pleural effusion.  Seen by pulmonary,  repeat CT checked on 02/06/2023, does not look like hemothorax, underwent bedside ultrasound-guided thoracentesis on 02/06/2023 fluid appears to be transudative, outpatient pulmonary follow-up postdischarge.  Will recheck chest x-ray on 02/07/2023.  Hyponatremia  - Has bilateral LE edema on exam, further IV fluids and diurese with Lasix and monitor   Non anion gap metabolic acidosis - Likely contraction alkalosis from intravascular depletion, third spacing of fluids and to some extent lactic acidosis.  Has been adequately hydrated.  Add protein supplements, diurese, this problem has resolved.   Etoh Abuse  Drinks 6 pack daily consult to quit, on Librium taper along with CIWA protocol.   Metabolic encephalopathy.  Getting worse at nighttime likely due to hospital delirium, ripping IVs, has had 10 IVs placed so far, placed on Seroquel twice daily, QTc on telemetry on 02/08/2023 is 420 ms.  Encephalopathy has improved.  Dyslipidemia  Crestor   Hypomagnesemia   Replaced, Continue to monitor   Chronic hypotension   blood pressure gradually improving, he is chronically on midodrine which will be continued   Hypoglycemia  Due to poor oral intake, stable random TSH and a.m. cortisol, monitor CBGs.        Condition - Extremely Guarded  Family Communication  :  None  Code Status :  Full  Consults  :  PCCM, Cards  PUD Prophylaxis :  PPI   Procedures  :     Echocardiogram.   1. Left ventricular ejection fraction, by estimation, is 40 to 45%. The left ventricle has mildly decreased  function. The left ventricle demonstrates regional wall motion abnormalities (see scoring diagram/findings for description). Left ventricular diastolic function could not be evaluated. There is global LV hypokinesis with akinesis of the basilar to mid inferior wall.  2. Right ventricular systolic function is severely reduced. The right ventricular size is moderately enlarged. Tricuspid regurgitation signal is inadequate for assessing PA pressure.  3. Left atrial size was severely dilated.  4. Right atrial size was severely dilated.  5. The mitral valve is thickened and the posterior leaflet is restricted there is moderate to severe central MR. The mitral valve is abnormal. Moderate to severe mitral valve regurgitation. No evidence of mitral stenosis.  6. The aortic valve is tricuspid. There is mild calcification of the aortic valve. Aortic valve regurgitation is not visualized. Aortic valve sclerosis/calcification is present, without any evidence of aortic stenosis.  7. The inferior vena cava is dilated in size with <50% respiratory variability, suggesting right atrial pressure of 15 mmHg.   CT head.  Nonacute.    CT chest repeat noncontrast on 02/06/2023.  1. New moderate right and small left pleural effusions with associated lower lobe consolidation, right greater than left. 2. Left rib fractures with interval increase in subpleural contusion in the anterolateral left upper lobe. 3. Stable mediastinal adenopathy. 4. New small volume perisplenic fluid. 5. Coronary and aortic Atherosclerosis (ICD10-I70.0).  CTA chest. 02/03/23 -  1. Acute segmental and subsegmental pulmonary emboli in the posterior right lower lobe with associated developing pulmonary infarct in the lateral basal segment. No evidence of acute right heart strain. 2. Acute fracture of the left lateral third rib with associated pulmonary contusion in the left upper lobe. No pneumothorax. 3. Unchanged comminuted fracture of the left clavicular head  and subacute fracture of the left lateral fifth rib. 4. No acute findings in the abdomen or pelvis. 5. Colonic diverticulosis without evidence of acute diverticulitis. Aortic Atherosclerosis (ICD10-I70.0)    CT abdomen pelvis.  1. Acute  segmental and subsegmental pulmonary emboli in the posterior right lower lobe with associated developing pulmonary infarct in the lateral basal segment. No evidence of acute right heart strain. 2. Acute fracture of the left lateral third rib with associated pulmonary contusion in the left upper lobe. No pneumothorax. 3. Unchanged comminuted fracture of the left clavicular head and subacute fracture of the left lateral fifth rib. 4. No acute findings in the abdomen or pelvis. 5. Colonic diverticulosis without evidence of acute diverticulitis. Aortic Atherosclerosis  Lower extremity venous duplex. No DVT      Disposition Plan  :    Status is: Inpatient  DVT Prophylaxis  :  Heparin gtt  Lab Results  Component Value Date   PLT 253 02/09/2023    Diet :  Diet Order             Diet regular Room service appropriate? No; Fluid consistency: Thin; Fluid restriction: 1200 mL Fluid  Diet effective now                    Inpatient Medications  Scheduled Meds:  (feeding supplement) PROSource Plus  30 mL Oral BID BM   amiodarone  200 mg Oral BID   [START ON 02/15/2023] amiodarone  200 mg Oral Daily   apixaban  10 mg Oral BID   Followed by   Melene Muller ON 02/13/2023] apixaban  5 mg Oral BID   chlordiazePOXIDE  10 mg Oral TID   ferrous sulfate  325 mg Oral Q breakfast   folic acid  1 mg Oral Daily   guaiFENesin  600 mg Oral BID   lidocaine  3 patch Transdermal Q24H   metoprolol tartrate  25 mg Oral BID   midodrine  10 mg Oral TID WC   mometasone-formoterol  2 puff Inhalation BID   multivitamin with minerals  1 tablet Oral Daily   pantoprazole  40 mg Oral Daily   polyethylene glycol  17 g Oral BID   QUEtiapine  50 mg Oral BID   rosuvastatin  10 mg Oral  Daily   [START ON 02/12/2023] thiamine  100 mg Oral Daily   Continuous Infusions:  thiamine (VITAMIN B1) injection 250 mg (02/08/23 1207)   PRN Meds:.[COMPLETED] acetaminophen **FOLLOWED BY** acetaminophen, albuterol, LORazepam **OR** LORazepam, [DISCONTINUED] ondansetron **OR** ondansetron (ZOFRAN) IV, traMADol  Antibiotics  :    Anti-infectives (From admission, onward)    None         Objective:   Vitals:   02/09/23 0400 02/09/23 0414 02/09/23 0747 02/09/23 0759  BP: 108/85  100/80   Pulse: 75  70 81  Resp: 18  20 17   Temp: 98 F (36.7 C)  98.3 F (36.8 C)   TempSrc: Oral  Axillary   SpO2: 97%  95%   Weight:  71.3 kg    Height:        Wt Readings from Last 3 Encounters:  02/09/23 71.3 kg  01/06/23 63.5 kg  12/15/22 67.4 kg     Intake/Output Summary (Last 24 hours) at 02/09/2023 0921 Last data filed at 02/09/2023 0600 Gross per 24 hour  Intake 240 ml  Output 400 ml  Net -160 ml     Physical Exam  Awake , confused, oriented x 2, no new F.N deficits, Normal affect [place].AT,PERRAL Supple Neck, No JVD,   Symmetrical Chest wall movement, moderate air movement bilaterally, reduced right lower lobe lung sounds, trace leg edema, RRR,No Gallops,Rubs or new Murmurs,  +ve B.Sounds, Abd Soft, No tenderness,  Data Review:    Recent Labs  Lab 02/03/23 1313 02/03/23 1405 02/05/23 0744 02/06/23 0326 02/07/23 0641 02/08/23 0309 02/09/23 0624  WBC 4.6   < > 5.1 4.0 4.5 6.0 4.9  HGB 9.6*   < > 9.1* 8.5* 8.8* 9.9* 8.9*  HCT 28.6*   < > 27.4* 25.5* 26.5* 30.3* 27.1*  PLT 216   < > 201 223 223 283 253  MCV 101.1*   < > 100.7* 100.0 99.6 104.5* 104.6*  MCH 33.9   < > 33.5 33.3 33.1 34.1* 34.4*  MCHC 33.6   < > 33.2 33.3 33.2 32.7 32.8  RDW 21.3*   < > 22.5* 22.5* 22.9* 23.4* 23.3*  LYMPHSABS 1.3  --   --   --  1.3 1.5 1.6  MONOABS 0.6  --   --   --  0.5 0.7 0.6  EOSABS 0.1  --   --   --  0.0 0.0 0.1  BASOSABS 0.1  --   --   --  0.0 0.0 0.0   < > = values  in this interval not displayed.    Recent Labs  Lab 02/03/23 1313 02/03/23 1314 02/04/23 0051 02/04/23 0226 02/04/23 1827 02/04/23 1828 02/05/23 0744 02/06/23 0326 02/07/23 0641 02/08/23 0309 02/09/23 0624  NA 123*   < > 121*   < >  --  123* 124*  --  131* 134* 135  K 3.7   < > 4.7   < >  --  3.9 3.6  --  3.5 4.4 4.4  CL 96*   < > 93*   < >  --  98 100  --  105 105 110  CO2 14*   < > 12*   < >  --  14* 15*  --  17* 18* 17*  ANIONGAP 13   < > 16*   < >  --  11 9  --  9 11 8   GLUCOSE 86   < > 133*   < >  --  114* 122*  --  97 92 86  BUN 8   < > 6*   < >  --  7* 9  --  14 17 17   CREATININE 1.01   < > 0.82   < >  --  0.75 0.87  --  1.07 1.16 0.99  AST 50*  --  49*  45*  --   --   --   --   --  34 47* 40  ALT 23  --  23  23  --   --   --   --   --  20 24 22   ALKPHOS 138*  --  140*  137*  --   --   --   --   --  136* 172* 146*  BILITOT 1.4*  --  1.2  0.9  --   --   --   --   --  0.5 0.6 0.7  ALBUMIN 3.3*  --  2.9*  2.8*  --   --   --   --   --  2.6* 2.7* 2.6*  LATICACIDVEN  --   --  3.3*  --  2.0*  --   --   --   --   --   --   TSH  --   --  5.143*  --   --   --   --  2.795  --   --   --  HGBA1C  --   --   --   --   --   --   --  5.2  --   --   --   AMMONIA  --   --  32  --   --   --   --   --   --   --   --   BNP  --    < > 1,077.8*  --   --   --   --  1,388.1* 1,655.5* 1,747.4* 2,274.2*  MG 1.2*  --  1.3*  1.3*  --   --   --   --   --  1.4* 2.2 1.7  CALCIUM 8.0*   < > 7.5*   < >  --  7.1* 7.0*  --  7.5* 8.0* 8.1*   < > = values in this interval not displayed.      Recent Labs  Lab 02/03/23 1313 02/03/23 1314 02/04/23 0051 02/04/23 0300 02/04/23 1827 02/04/23 1828 02/05/23 0744 02/06/23 0326 02/07/23 0641 02/08/23 0309 02/09/23 6578  LATICACIDVEN  --   --  3.3*  --  2.0*  --   --   --   --   --   --   TSH  --   --  5.143*  --   --   --   --  2.795  --   --   --   HGBA1C  --   --   --   --   --   --   --  5.2  --   --   --   AMMONIA  --   --  32  --   --   --    --   --   --   --   --   BNP  --    < > 1,077.8*  --   --   --   --  1,388.1* 1,655.5* 1,747.4* 2,274.2*  MG 1.2*  --  1.3*  1.3*  --   --   --   --   --  1.4* 2.2 1.7  CALCIUM 8.0*   < > 7.5*   < >  --  7.1* 7.0*  --  7.5* 8.0* 8.1*   < > = values in this interval not displayed.      Micro Results Recent Results (from the past 240 hour(s))  Resp panel by RT-PCR (RSV, Flu A&B, Covid) Anterior Nasal Swab     Status: None   Collection Time: 02/03/23  2:39 PM   Specimen: Anterior Nasal Swab  Result Value Ref Range Status   SARS Coronavirus 2 by RT PCR NEGATIVE NEGATIVE Final   Influenza A by PCR NEGATIVE NEGATIVE Final   Influenza B by PCR NEGATIVE NEGATIVE Final    Comment: (NOTE) The Xpert Xpress SARS-CoV-2/FLU/RSV plus assay is intended as an aid in the diagnosis of influenza from Nasopharyngeal swab specimens and should not be used as a sole basis for treatment. Nasal washings and aspirates are unacceptable for Xpert Xpress SARS-CoV-2/FLU/RSV testing.  Fact Sheet for Patients: BloggerCourse.com  Fact Sheet for Healthcare Providers: SeriousBroker.it  This test is not yet approved or cleared by the [place] FDA and has been authorized for detection and/or diagnosis of SARS-CoV-2 by FDA under an Emergency Use Authorization (EUA). This EUA will remain in effect (meaning this test can be used) for the duration of the COVID-19 declaration under Section 564(b)(1) of the Act, 21 U.S.C. section  360bbb-3(b)(1), unless the authorization is terminated or revoked.     Resp Syncytial Virus by PCR NEGATIVE NEGATIVE Final    Comment: (NOTE) Fact Sheet for Patients: BloggerCourse.com  Fact Sheet for Healthcare Providers: SeriousBroker.it  This test is not yet approved or cleared by the [place] FDA and has been authorized for detection and/or diagnosis of SARS-CoV-2 by FDA  under an Emergency Use Authorization (EUA). This EUA will remain in effect (meaning this test can be used) for the duration of the COVID-19 declaration under Section 564(b)(1) of the Act, 21 U.S.C. section 360bbb-3(b)(1), unless the authorization is terminated or revoked.  Performed at [facility], [address] [place], [place] [address]   Culture, body fluid w Gram Stain-bottle     Status: None (Preliminary result)   Collection Time: 02/06/23 12:00 PM   Specimen: Pleura  Result Value Ref Range Status   Specimen Description PLEURAL  Final   Special Requests NONE  Final   Culture   Final    NO GROWTH 3 DAYS Performed at [facility], [address] [place], [place] [address]    Report Status PENDING  Incomplete  Gram stain     Status: None   Collection Time: 02/06/23 12:00 PM   Specimen: Pleura  Result Value Ref Range Status   Specimen Description PLEURAL  Final   Special Requests NONE  Final   Gram Stain   Final    FEW WBC SEEN NO ORGANISMS SEEN Performed at [facility], [address], [place], [place] [address]    Report Status 02/06/2023 FINAL  Final    Radiology Reports DG Chest Port 1 View  Result Date: 02/07/2023 CLINICAL DATA:  Shortness of breath EXAM: PORTABLE CHEST 1 VIEW COMPARISON:  Chest radiograph dated 02/06/2023. FINDINGS: The heart is enlarged. Vascular calcifications are seen in the aortic arch. There is a small to moderate right pleural effusion with associated atelectasis/airspace disease, slightly increased compared to prior exam. A left pleural effusion is difficult to exclude. There is left basilar atelectasis/airspace disease. IMPRESSION: Small to moderate right pleural effusion with associated atelectasis/airspace disease, slightly increased compared to prior exam. Electronically Signed   By: Romona Curls M.D.   On: 02/07/2023 15:34   DG Chest Port 1 View  Result Date: 02/06/2023 CLINICAL DATA:  Post thoracentesis. EXAM:  PORTABLE CHEST 1 VIEW COMPARISON:  CT and radiographs 02/06/2023. FINDINGS: 1135 hours. The heart size and mediastinal contours are stable with mild aortic atherosclerosis. Right pleural effusion has decreased in volume following presumed right-sided thoracentesis. No evidence of pneumothorax. A small left pleural effusion appears unchanged. There is interval improved aeration of the right lung. Patchy left basilar pulmonary opacity appears stable. No acute osseous findings are seen; known left-sided rib fractures are not well visualized. Telemetry leads overlie the chest. IMPRESSION: 1. Decreased right pleural effusion following thoracentesis. No evidence of pneumothorax. 2. Stable small left pleural effusion and patchy left basilar pulmonary opacity. Electronically Signed   By: Carey Bullocks M.D.   On: 02/06/2023 12:02   CT CHEST WO CONTRAST  Result Date: 02/06/2023 CLINICAL DATA:  Pneumonia, complication suspected, xray done EXAM: CT CHEST WITHOUT CONTRAST TECHNIQUE: Multidetector CT imaging of the chest was performed following the standard protocol without IV contrast. RADIATION DOSE REDUCTION: This exam was performed according to the departmental dose-optimization program which includes automated exposure control, adjustment of the mA and/or kV according to patient size and/or use of iterative reconstruction technique. COMPARISON:  CTA 02/03/2023 FINDINGS: Cardiovascular: Coronary and scattered aortic calcifications. Mild four-chamber cardiac enlargement. No pericardial effusion. Mediastinum/Nodes: No mediastinal hematoma or mass. 1.2 cm subcarinal node, previously 1.4 on 01/06/2023. 1.4 cm right paratracheal node, previously 1.3. Additional scattered subcentimeter prominent pre-vascular, and AP window lymph nodes stable. No definite hilar adenopathy. Postop changes at the GE junction with regional streaky changes in the posterior mediastinal fat, stable since 01/06/2023. Lungs/Pleura: New moderate right  pleural effusion. New small left pleural effusion. No pneumothorax. Fairly dense consolidation throughout most of the right lower lobe, and in the posterior dependent aspect of the left lower lobe. Subpleural ground-glass opacities in the anterolateral left upper lobe have slightly increased since previous exam Upper Abdomen: Surgical clips left upper quadrant. Small volume perisplenic fluid, new since prior study. Layering hyperdensity in the gallbladder possibly vicarious excretion of previously administered IV contrast material. Musculoskeletal: Minimally displaced fracture deformities of left ribs 3 and 5 as before. Comminuted left clavicle fracture as before. Bilateral acromioclavicular and glenohumeral degenerative change. IMPRESSION: 1. New moderate right and small left pleural effusions with associated lower lobe consolidation, right greater than left. 2. Left rib fractures with interval increase in subpleural contusion in the anterolateral left upper lobe. 3. Stable mediastinal adenopathy. 4. New small volume perisplenic fluid. 5. Coronary and aortic Atherosclerosis (ICD10-I70.0). Electronically Signed   By: Corlis Leak M.D.   On: 02/06/2023 09:24   DG Chest Port 1 View  Result Date: 02/06/2023 CLINICAL DATA:  Shortness of breath EXAM: PORTABLE CHEST 1 VIEW COMPARISON:  Abdominal CT from 3 days ago FINDINGS: Sizable right pleural effusion with leftward displacement of the mediastinum. Underlying pulmonary opacity in the setting of pulmonary embolism. Cardiomegaly. No pneumothorax. Chronic fracture at the lateral right clavicle. Prelim sent in epic chat. IMPRESSION: Large right pleural effusion since chest CT 3 days ago. Consider updated CT. Electronically Signed   By: Tiburcio Pea M.D.   On: 02/06/2023 06:58      Signature  -   Susa Raring M.D on 02/09/2023 at 9:21 AM   -  To page go to [email or web address]

## 2023-02-09 NOTE — TOC Progression Note (Signed)
Transition of Care [facility]) - Progression Note    Patient Details  Name: Anthony [name] MRN: [id number] Date of Birth: [birth year]  [facility] CM/SW Contact  [name], LCSW Phone Number: 02/09/2023, 1:36 PM  Clinical Narrative:    CSW submitted clinicals for pasrr. Will provide SNF bed offers once patient is off CIWA.    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, SNF Pending bed offer  Expected Discharge Plan and Services In-house Referral: Clinical Social Work   Post Acute Care Choice: Skilled Nursing Facility Living arrangements for the past 2 months: Homeless Shelter                                       Social Determinants of Health (SDOH) Interventions SDOH Screenings   Food Insecurity: Food Insecurity Present (12/11/2022)  Housing: High Risk (12/02/2022)  Transportation Needs: Unmet Transportation Needs (12/11/2022)  Utilities: Not At Risk (12/11/2022)  Recent Concern: Utilities - At Risk (12/02/2022)  Depression (PHQ2-9): Medium Risk (10/16/2020)  Tobacco Use: High Risk (02/04/2023)    Readmission Risk Interventions    02/08/2023    5:25 PM 10/12/2022    1:29 PM 08/28/2022    2:00 PM  Readmission Risk Prevention Plan  Transportation Screening Complete Complete Complete  Medication Review Oceanographer) Complete Complete   PCP or Specialist appointment within 3-5 days of discharge Complete Complete Complete  HRI or Home Care Consult Complete Complete   SW Recovery Care/Counseling Consult Complete Complete Complete  Palliative Care Screening Not Applicable Not Applicable Not Applicable  Skilled Nursing Facility Complete Not Applicable Not Applicable

## 2023-02-09 NOTE — Progress Notes (Addendum)
Physical Therapy Treatment Patient Details Name: Anthony [name] MRN: [id number] DOB: [birth year] Today's Date: 02/09/2023   History of Present Illness Anthony [name] is a 67 y.o. male who presents with SOB. CT scans concerning for PE. PMH: alcohol abuse, housing insecurity, PE, depression, HTN, rhabdomyolysis, CVA.    PT Comments  Pt received in supine, with restraints donned, RN clearance for session, pt confused and not appearing oriented fully to situation/time, but following simple commands well with increased time. Pt with very low shuffled steps, needing multimodal cues for improved gait mechanics but with poor carryover of cues. Pt able to stand unsupported at sink briefly but with some posterior instability requiring min guard and up to minA (+2 safety) for gait with chair follow. Pt in bed at end of session due to c/o fatigue but restraints off, RN aware, pt also with meds in cup on tray table. Pt continues to benefit from PT services to progress toward functional mobility goals, continue to recommend lower intensity post-acute rehab <3 hours/day.     Assistance Recommended at Discharge Frequent or constant Supervision/Assistance  If plan is discharge home, recommend the following:  Can travel by private vehicle    A Pattillo help with walking and/or transfers;A Flaten help with bathing/dressing/bathroom;Assistance with cooking/housework;Assist for transportation;Help with stairs or ramp for entrance      Equipment Recommendations  Rollator (4 wheels)    Recommendations for Other Services       Precautions / Restrictions Precautions Precautions: Fall Precaution Comments: AMS Restrictions Weight Bearing Restrictions: No     Mobility  Bed Mobility Overal bed mobility: Needs Assistance Bed Mobility: Supine to Sit, Sit to Supine     Supine to sit: Min guard Sit to supine: Min guard   General bed mobility comments: increased time, verbal cueing for initiation and hand  placement    Transfers Overall transfer level: Needs assistance Equipment used: Rolling walker (2 wheels) Transfers: Sit to/from Stand Sit to Stand: Min guard, Supervision           General transfer comment: able to transfer with min guard-supervision, consistent verbal cueing for task initiation, hand placement, safety awareness    Ambulation/Gait Ambulation/Gait assistance: Min assist, +2 safety/equipment Gait Distance (Feet): 65 Feet Assistive device: Rolling walker (2 wheels) Gait Pattern/deviations: Step-to pattern, Decreased dorsiflexion - left, Decreased dorsiflexion - right, Shuffle, Narrow base of support       General Gait Details: Very slow short shuffled steps, pt at times needing manual assist to step forward, but with dense cues pt occasionally able to improve hip flexion/step length slightly, but returns to shuffled narrow steps a few moments later if not cued consistently. HR WFL 80's bpm sitting and to 97 bpm with exertion; SpO2 checked on toe once in bed and WFL on RA, no pleth signal on his fingertips, which appear pale in color. Chair follow for safety given pt cognitive deficit but he was able to return to EOB without needing seated break   Stairs             Wheelchair Mobility     Tilt Bed    Modified Rankin (Stroke Patients Only)       Balance Overall balance assessment: Needs assistance Sitting-balance support: No upper extremity supported, Feet supported Sitting balance-Leahy Scale: Good Sitting balance - Comments: able to lean forward during hygiene task (cleaning his legs off) without LOB   Standing balance support: No upper extremity supported Standing balance-Leahy Scale: Fair Standing balance comment: Fair  static standing balance unsupported (peri care and hand washing at sink) but poor dynamic standing balance, needing RW for gait progression.                            Cognition Arousal/Alertness: Lethargic Behavior  During Therapy: Flat affect, Impulsive Overall Cognitive Status: Impaired/Different from baseline Area of Impairment: Memory, Following commands, Safety/judgement, Awareness, Attention, Orientation, Problem solving                 Orientation Level: Disoriented to, Time, Situation, Place Current Attention Level: Focused Memory: Decreased short-term memory Following Commands: Follows one step commands with increased time Safety/Judgement: Decreased awareness of safety, Decreased awareness of deficits Awareness: Intellectual Problem Solving: Slow processing, Difficulty sequencing, Requires verbal cues, Requires tactile cues General Comments: able to follow commands with increased time, tangential        Exercises      General Comments General comments (skin integrity, edema, etc.): HR 86 bpm EOB, to 97 bpm with exertional tasks      Pertinent Vitals/Pain Pain Assessment Pain Assessment: Faces Faces Pain Scale: Hurts a Hin[place] bit Pain Location: generalized Pain Descriptors / Indicators: Grimacing Pain Intervention(s): Monitored during session, Repositioned           PT Goals (current goals can now be found in the care plan section) Acute Rehab PT Goals Patient Stated Goal: get stronger and safe housing PT Goal Formulation: With patient Time For Goal Achievement: 02/19/23 Progress towards PT goals: Progressing toward goals    Frequency    Min 3X/week      PT Plan Current plan remains appropriate    Co-evaluation PT/OT/SLP Co-Evaluation/Treatment: Yes Reason for Co-Treatment: Necessary to address cognition/behavior during functional activity;Complexity of the patient's impairments (multi-system involvement);For patient/therapist safety;To address functional/ADL transfers PT goals addressed during session: Mobility/safety with mobility;Proper use of DME;Balance        AM-PAC PT "6 Clicks" Mobility   Outcome Measure  Help needed turning from your back to  your side while in a flat bed without using bedrails?: A Gruenewald Help needed moving from lying on your back to sitting on the side of a flat bed without using bedrails?: A Stegenga Help needed moving to and from a bed to a chair (including a wheelchair)?: A Curington Help needed standing up from a chair using your arms (e.g., wheelchair or bedside chair)?: A Brocato Help needed to walk in hospital room?: A Lot (+2 safety) Help needed climbing 3-5 steps with a railing? : Total 6 Click Score: 15    End of Session Equipment Utilized During Treatment: Gait belt Activity Tolerance: Patient tolerated treatment well Patient left: in bed;with call bell/phone within reach;with bed alarm set;Other (comment) (pt heels floated) Nurse Communication: Mobility status;Other (comment) (restraints off as pt following most commands during session and pt requesting to nap after PT session, pt had meds on tray table but had not taken them) PT Visit Diagnosis: Other abnormalities of gait and mobility (R26.89);Muscle weakness (generalized) (M62.81);Difficulty in walking, not elsewhere classified (R26.2)     Time: 1210-1250 PT Time Calculation (min) (ACUTE ONLY): 40 min  Charges:    $Gait Training: 8-22 mins PT General Charges $$ ACUTE PT VISIT: 1 Visit                     Dejanique Ruehl P., PTA Acute Rehabilitation Services Secure Chat Preferred 9a-5:30pm Office: [phone]    Angus Palms 02/09/2023, 3:16 PM

## 2023-02-09 NOTE — Plan of Care (Signed)

## 2023-02-09 NOTE — Care Management Important Message (Signed)
Important Message  Patient Details  Name: Anthony [name] MRN: [id number] Date of Birth: Dec 08, 1955   Medicare Important Message Given:  Yes     Kiona Blume Stefan Church 02/09/2023, 4:21 PM

## 2023-02-09 NOTE — Progress Notes (Signed)
Occupational Therapy Treatment Patient Details Name: Anthony [name] MRN: [id number] DOB: April 23, 1956 Today's Date: 02/09/2023   History of present illness Matilde A [name] is a 67 y.o. male who presents with SOB. CT scans concerning for PE. PMH: alcohol abuse, housing insecurity, PE, depression, HTN, rhabdomyolysis, CVA.   OT comments  Pt motivated to participate, able to complete full 40 minutes of therapy, required increased time for all activities, consistent verbal cueing for task initiation and hand placement for functional activities. Pt heavy reliance on RW for ambulation, was abel to stand at sink performing hand hygiene, required assistance for LB sponge bathing and UB bathing, min to mod A. Pt c/o itchiness on arms/legs, some red rashes noticed on BUEs. Nursing informed B arm restraints left off, pills still in cup on table. Pt DC plan still appropriate, would definitely benefit from post acute rehab prior to DC to improve functional strength/independence to safe level.   Recommendations for follow up therapy are one component of a multi-disciplinary discharge planning process, led by the attending physician.  Recommendations may be updated based on patient status, additional functional criteria and insurance authorization.    Assistance Recommended at Discharge Intermittent Supervision/Assistance  Patient can return home with the following  A Meddings help with walking and/or transfers;A Surgeon help with bathing/dressing/bathroom;Assistance with cooking/housework;Assist for transportation;Help with stairs or ramp for entrance   Equipment Recommendations  None recommended by OT    Recommendations for Other Services      Precautions / Restrictions Precautions Precautions: Fall Precaution Comments: AMS, watch O2 Restrictions Weight Bearing Restrictions: No       Mobility Bed Mobility Overal bed mobility: Needs Assistance Bed Mobility: Supine to Sit, Sit to Supine     Supine to sit:  Min guard Sit to supine: Min guard   General bed mobility comments: increased time, verbal cueing for initiation and hand placement    Transfers Overall transfer level: Needs assistance Equipment used: Rolling walker (2 wheels) Transfers: Sit to/from Stand Sit to Stand: Min guard, Supervision           General transfer comment: able to trasnfer with min guard-supervision, consistent verbal cueing for task initiation, hand placement, safety awareness     Balance Overall balance assessment: Needs assistance Sitting-balance support: No upper extremity supported, Feet supported Sitting balance-Leahy Scale: Fair Sitting balance - Comments: EOB   Standing balance support: Bilateral upper extremity supported, During functional activity Standing balance-Leahy Scale: Poor Standing balance comment: is able to stand unsupported, flexed at hips, not able to stand straight, performed hand hygiene with min guard                           ADL either performed or assessed with clinical judgement   ADL Overall ADL's : Needs assistance/impaired Eating/Feeding: Set up;Sitting   Grooming: Set up;Supervision/safety   Upper Body Bathing: Minimal assistance   Lower Body Bathing: Minimal assistance;Sitting/lateral leans;Sit to/from stand           Toilet Transfer: Lawyer and Hygiene: Moderate assistance;Sit to/from stand       Functional mobility during ADLs: Min guard General ADL Comments: min guard for mobility, not able to reach behind to perform perineal hygiene, increased time and slow movement for all activities    Extremity/Trunk Assessment Upper Extremity Assessment Upper Extremity Assessment: Generalized weakness   Lower Extremity Assessment Lower Extremity Assessment: Defer to PT evaluation  Vision       Perception     Praxis      Cognition Arousal/Alertness: Lethargic Behavior During Therapy: Flat  affect, Impulsive Overall Cognitive Status: Impaired/Different from baseline Area of Impairment: Memory, Following commands, Safety/judgement, Awareness, Attention, Orientation, Problem solving                 Orientation Level: Disoriented to, Time, Situation, Place Current Attention Level: Focused Memory: Decreased short-term memory Following Commands: Follows one step commands with increased time Safety/Judgement: Decreased awareness of safety, Decreased awareness of deficits Awareness: Intellectual Problem Solving: Slow processing, Decreased initiation, Difficulty sequencing, Requires verbal cues, Requires tactile cues General Comments:  (able to follow commands with increased time,)        Exercises      Shoulder Instructions       General Comments      Pertinent Vitals/ Pain       Pain Assessment Pain Assessment: Faces Faces Pain Scale: Hurts a [place] bit  Home Living                                          Prior Functioning/Environment              Frequency  Min 1X/week        Progress Toward Goals  OT Goals(current goals can now be found in the care plan section)  Progress towards OT goals: Progressing toward goals  Acute Rehab OT Goals Patient Stated Goal: to improve activity tolerance and strength OT Goal Formulation: With patient Time For Goal Achievement: 02/19/23 Potential to Achieve Goals: Good ADL Goals Pt Will Perform Grooming: standing;with supervision Pt Will Perform Lower Body Bathing: with supervision;sit to/from stand Pt Will Perform Lower Body Dressing: with supervision;sit to/from stand Pt Will Transfer to Toilet: with supervision;ambulating;regular height toilet Pt Will Perform Toileting - Clothing Manipulation and hygiene: with supervision;sit to/from stand  Plan Discharge plan remains appropriate    Co-evaluation    PT/OT/SLP Co-Evaluation/Treatment: Yes Reason for Co-Treatment: Necessary to  address cognition/behavior during functional activity;Complexity of the patient's impairments (multi-system involvement)          AM-PAC OT "6 Clicks" Daily Activity     Outcome Measure   Help from another person eating meals?: A Byron Help from another person taking care of personal grooming?: None Help from another person toileting, which includes using toliet, bedpan, or urinal?: A Lot Help from another person bathing (including washing, rinsing, drying)?: A Jambor Help from another person to put on and taking off regular upper body clothing?: A Magouirk Help from another person to put on and taking off regular lower body clothing?: A Bir 6 Click Score: 18    End of Session Equipment Utilized During Treatment: Gait belt;Rolling walker (2 wheels)  OT Visit Diagnosis: Unsteadiness on feet (R26.81);Muscle weakness (generalized) (M62.81);Other (comment)   Activity Tolerance Patient tolerated treatment well   Patient Left in bed;with call bell/phone within reach;with bed alarm set   Nurse Communication Mobility status;Other (comment) (informed nursing restraints were left off, meds still in cup on table)        Time: 1210-1250 OT Time Calculation (min): 40 min  Charges: OT General Charges $OT Visit: 1 Visit OT Treatments $Self Care/Home Management : 23-37 mins  [address], OTR/L   Alexis Goodell 02/09/2023, 1:23 PM

## 2023-02-10 DIAGNOSIS — I2694 Multiple subsegmental pulmonary emboli without acute cor pulmonale: Secondary | ICD-10-CM | POA: Diagnosis not present

## 2023-02-10 LAB — CBC WITH DIFFERENTIAL/PLATELET
Abs Immature Granulocytes: 0.03 10*3/uL (ref 0.00–0.07)
Basophils Absolute: 0 10*3/uL (ref 0.0–0.1)
Basophils Relative: 1 %
Eosinophils Absolute: 0.1 10*3/uL (ref 0.0–0.5)
Eosinophils Relative: 2 %
HCT: 28.2 % — ABNORMAL LOW (ref 39.0–52.0)
Hemoglobin: 9.2 g/dL — ABNORMAL LOW (ref 13.0–17.0)
Immature Granulocytes: 1 %
Lymphocytes Relative: 30 %
Lymphs Abs: 1.2 10*3/uL (ref 0.7–4.0)
MCH: 34.2 pg — ABNORMAL HIGH (ref 26.0–34.0)
MCHC: 32.6 g/dL (ref 30.0–36.0)
MCV: 104.8 fL — ABNORMAL HIGH (ref 80.0–100.0)
Monocytes Absolute: 0.6 10*3/uL (ref 0.1–1.0)
Monocytes Relative: 14 %
Neutro Abs: 2.2 10*3/uL (ref 1.7–7.7)
Neutrophils Relative %: 52 %
Platelets: 274 10*3/uL (ref 150–400)
RBC: 2.69 MIL/uL — ABNORMAL LOW (ref 4.22–5.81)
RDW: 23.9 % — ABNORMAL HIGH (ref 11.5–15.5)
WBC: 4.1 10*3/uL (ref 4.0–10.5)
nRBC: 0 % (ref 0.0–0.2)

## 2023-02-10 LAB — COMPREHENSIVE METABOLIC PANEL
ALT: 23 U/L (ref 0–44)
AST: 48 U/L — ABNORMAL HIGH (ref 15–41)
Albumin: 2.7 g/dL — ABNORMAL LOW (ref 3.5–5.0)
Alkaline Phosphatase: 136 U/L — ABNORMAL HIGH (ref 38–126)
Anion gap: 14 (ref 5–15)
BUN: 18 mg/dL (ref 8–23)
CO2: 17 mmol/L — ABNORMAL LOW (ref 22–32)
Calcium: 8.2 mg/dL — ABNORMAL LOW (ref 8.9–10.3)
Chloride: 108 mmol/L (ref 98–111)
Creatinine, Ser: 1.04 mg/dL (ref 0.61–1.24)
GFR, Estimated: >60 mL/min (ref >60)
Glucose, Bld: 82 mg/dL (ref 70–99)
Potassium: 4 mmol/L (ref 3.5–5.1)
Sodium: 139 mmol/L (ref 135–145)
Total Bilirubin: 0.9 mg/dL (ref 0.3–1.2)
Total Protein: 5.9 g/dL — ABNORMAL LOW (ref 6.5–8.1)

## 2023-02-10 LAB — CULTURE, BODY FLUID W GRAM STAIN -BOTTLE: Culture: NO GROWTH

## 2023-02-10 LAB — BRAIN NATRIURETIC PEPTIDE: B Natriuretic Peptide: 1666.7 pg/mL — ABNORMAL HIGH (ref 0.0–100.0)

## 2023-02-10 LAB — MAGNESIUM: Magnesium: 1.4 mg/dL — ABNORMAL LOW (ref 1.7–2.4)

## 2023-02-10 MED ORDER — CHLORDIAZEPOXIDE HCL 5 MG PO CAPS
10.0000 mg | ORAL_CAPSULE | Freq: Two times a day (BID) | ORAL | Status: AC
  Administered 2023-02-11 (×2): 10 mg via ORAL
  Filled 2023-02-10 (×2): qty 2

## 2023-02-10 MED ORDER — SODIUM BICARBONATE 650 MG PO TABS
650.0000 mg | ORAL_TABLET | Freq: Three times a day (TID) | ORAL | Status: AC
  Administered 2023-02-10 – 2023-02-12 (×8): 650 mg via ORAL
  Filled 2023-02-10 (×9): qty 1

## 2023-02-10 MED ORDER — CHLORDIAZEPOXIDE HCL 5 MG PO CAPS
5.0000 mg | ORAL_CAPSULE | Freq: Two times a day (BID) | ORAL | Status: AC
  Administered 2023-02-12 (×2): 5 mg via ORAL
  Filled 2023-02-10 (×2): qty 1

## 2023-02-10 MED ORDER — VITAMIN D (ERGOCALCIFEROL) 1.25 MG (50000 UNIT) PO CAPS
50000.0000 [IU] | ORAL_CAPSULE | ORAL | Status: DC
  Administered 2023-02-10: 50000 [IU] via ORAL
  Filled 2023-02-10: qty 1

## 2023-02-10 MED ORDER — MAGNESIUM SULFATE 4 GM/100ML IV SOLN
4.0000 g | Freq: Once | INTRAVENOUS | Status: AC
  Administered 2023-02-10: 4 g via INTRAVENOUS
  Filled 2023-02-10: qty 100

## 2023-02-10 NOTE — Plan of Care (Signed)

## 2023-02-10 NOTE — Progress Notes (Signed)
Physical Therapy Treatment Patient Details Name: TORIN MODICA MRN: [id number] DOB: May 29, 1956 Today's Date: 02/10/2023   History of Present Illness Grantley A Habib is a 67 y.o. male who presents with SOB. CT scans +PE, acute L 3rd rib fx, subacute L 5th rib fx, comminuted fracture of the left clavicular head  PMH: alcohol abuse, housing insecurity, PE, depression, HTN, rhabdomyolysis, CVA.    PT Comments  Noted pt trying to pull up to sit long-sitting in bed upon entering room. Maintaining eyes closed throughout session, but verbally responsive. Initially following commands as gown donned and sitting up for cords to be untangled from behind his back. Up to long-sitting 3x during session but would not move legs over EOB to sit EOB. Attempts to manually facilitate/guide him and pt resisted moving his legs. Ultimately returned to supine with bil wrist restraints resumed (as they were at beginning of session).      Assistance Recommended at Discharge Frequent or constant Supervision/Assistance  If plan is discharge home, recommend the following:  Can travel by private vehicle    A Gregory help with walking and/or transfers;A Dhondt help with bathing/dressing/bathroom;Assistance with cooking/housework;Assist for transportation;Help with stairs or ramp for entrance      Equipment Recommendations  Rollator (4 wheels)    Recommendations for Other Services       Precautions / Restrictions Precautions Precautions: Fall Precaution Comments: AMS Restrictions Weight Bearing Restrictions: No     Mobility  Bed Mobility Overal bed mobility: Needs Assistance Bed Mobility: Supine to Sit, Sit to Supine     Supine to sit: Min guard Sit to supine: Supervision   General bed mobility comments: pt unrestrained and again pulled himself up to long-sitting; unable to get pt to move legs over EOB to put feet on the floor (pt actively resisting "don't pull on me"); returned to supine    Transfers                    General transfer comment: unable due to lethargy, not willing    Ambulation/Gait                   Stairs             Wheelchair Mobility     Tilt Bed    Modified Rankin (Stroke Patients Only)       Balance Overall balance assessment: Needs assistance Sitting-balance support: Feet supported, Bilateral upper extremity supported Sitting balance-Leahy Scale: Fair Sitting balance - Comments: in long-sitting on bed, would not turn to sit EOB                                    Cognition Arousal/Alertness: Lethargic Behavior During Therapy: Flat affect, Impulsive Overall Cognitive Status: Impaired/Different from baseline Area of Impairment: Memory, Following commands, Safety/judgement, Awareness, Attention, Orientation, Problem solving                 Orientation Level: Disoriented to, Time, Situation, Place Current Attention Level: Focused Memory: Decreased short-term memory Following Commands: Follows one step commands with increased time, Follows one step commands inconsistently Safety/Judgement: Decreased awareness of safety, Decreased awareness of deficits Awareness: Intellectual Problem Solving: Slow processing, Difficulty sequencing, Requires verbal cues, Requires tactile cues General Comments: attempting to sit up into long-sitting on arrival; reports back hurts; won't keep eyes open despite verbally responding and following some simple commands  Exercises      General Comments General comments (skin integrity, edema, etc.): Patient with bil wrist restraints on arrival with gown off and somewhat tangled in cords. Pt able to follow command to return to supine from long-sitting and follow commands for lifting arms, putting through sleeves of gown, raising up to long-sitting to remove cords from underneath him. Would not continue to follow commands for turn to sit EOB or to get OOB.      Pertinent  Vitals/Pain Pain Assessment Pain Assessment: Faces Faces Pain Scale: Hurts even more Pain Location: back Pain Descriptors / Indicators: Grimacing, Moaning Pain Intervention(s): Limited activity within patient's tolerance, Repositioned    Home Living                          Prior Function            PT Goals (current goals can now be found in the care plan section) Acute Rehab PT Goals Patient Stated Goal: get stronger and safe housing Time For Goal Achievement: 02/19/23 Potential to Achieve Goals: Good Progress towards PT goals: Not progressing toward goals - comment (lethargy)    Frequency    Min 3X/week      PT Plan Current plan remains appropriate    Co-evaluation              AM-PAC PT "6 Clicks" Mobility   Outcome Measure  Help needed turning from your back to your side while in a flat bed without using bedrails?: A Florer Help needed moving from lying on your back to sitting on the side of a flat bed without using bedrails?: A Bronaugh Help needed moving to and from a bed to a chair (including a wheelchair)?: A [place] Help needed standing up from a chair using your arms (e.g., wheelchair or bedside chair)?: A Florendo Help needed to walk in hospital room?: Total (+2 safety) Help needed climbing 3-5 steps with a railing? : Total 6 Click Score: 14    End of Session   Activity Tolerance: Patient limited by lethargy Patient left: in bed;with call bell/phone within reach;with bed alarm set;with restraints reapplied (bil wrist restraints)   PT Visit Diagnosis: Other abnormalities of gait and mobility (R26.89);Muscle weakness (generalized) (M62.81);Difficulty in walking, not elsewhere classified (R26.2)     Time: 6606-3016 PT Time Calculation (min) (ACUTE ONLY): 15 min  Charges:    $Therapeutic Activity: 8-22 mins PT General Charges $$ ACUTE PT VISIT: 1 Visit                      Jerolyn Center, PT Acute Rehabilitation Services  Office  [phone]    Zena Amos 02/10/2023, 8:49 AM

## 2023-02-10 NOTE — Progress Notes (Signed)
[facility] Progress Note  Patient: Anthony [name]    JYN:[id number]  DOA: 02/03/2023     Date of Service: the patient was seen and examined on 02/10/2023  Chief Complaint  Patient presents with   Shortness of Breath   Hypoglycemia   Brief hospital course:  Brief Narrative (HPI from H&P)   67 y.o. male with medical history significant of alcohol abuse, anxiety, HTN, homelessness, CVA who presented with chest pain and shortness of breath.  He was found to have a pulmonary embolism as well as atrial fibrillation with RVR and soft blood pressures, hyponatremic with edema, cardiology was consulted and he was admitted to the hospital.  Transferred to my care on 02/06/2023  Assessment and Plan:  Acute Pulmonary Embolism with Infarct:  CT PE protocol with acute segmental and subsegmental PE in posterior RLL with associated developing pulmonary infarct in the lateral basal segment, negative lower extremity venous duplex.  Echo noted with reduction in RV systolic function.  Now transitioned from heparin drip to Eliquis.   Acute Fracture of L Lateral 3rd Rib with Associated Pulmonary Contusion:  Unchanged Comminuted Fracture of the L Clavicular Head and Subacute Fracture of the L Lateral 5th Rib, supportive care, PT OT, I-S for pulmonary toiletry.  Encouraged to sit in chair in daytime.  Monitor.   Atrial Fibrillation with RVR: Advised to score of at least 4.  Echo noted, discussed with cardiology on 02/06/2023 Dr. Jens Som, TSH stable currently on amiodarone which will be continued, Eliquis.  Acute on chronic systolic left-sided heart failure along with RV systolic dysfunction as well.  Hold further fluids, diurese as tolerated by blood pressure and monitor.  Blood pressure too low for ACE/ARB/Entresto, low-dose beta-blocker if tolerated.  Seen by cardiology this admission nothing else to offer.   New right-sided pleural effusion.  Seen by pulmonary, repeat CT checked on 02/06/2023, does not look like  hemothorax, underwent bedside ultrasound-guided thoracentesis on 02/06/2023 fluid appears to be transudative, outpatient pulmonary follow-up postdischarge.   Repeat chest x-ray on 02/07/2023 Small to moderate right pleural effusion with associated atelectasis/airspace disease, slightly increased compared to prior exam.   Hyponatremia  - Has bilateral LE edema on exam, further IV fluids and diurese with Lasix and monitor   Non anion gap metabolic acidosis - Likely contraction alkalosis from intravascular depletion, third spacing of fluids and to some extent lactic acidosis.  Has been adequately hydrated.  Add protein supplements, diurese, this problem has resolved.   Etoh Abuse  Drinks 6 pack daily consult to quit, on Librium taper along with CIWA protocol.   Metabolic encephalopathy.  Getting worse at nighttime likely due to hospital delirium, ripping IVs, has had 10 IVs placed so far, placed on Seroquel twice daily, QTc on telemetry on 02/08/2023 is 420 ms.  Encephalopathy has improved.   Dyslipidemia  Crestor   Hypomagnesemia   Replaced, Continue to monitor   Chronic hypotension   blood pressure gradually improving, he is chronically on midodrine which will be continued   Hypoglycemia  Due to poor oral intake, stable random TSH and a.m. cortisol, monitor CBGs.  Metabolic acidosis, started oral bicarbonate.  Iron deficiency, transferrin saturation 6%, continue oral iron supplement.  Follow with PCP to repeat iron profile after 3 to 6 months. Vitamin D Insufficiency: started vitamin D 50,000 units p.o. weekly, follow with PCP to repeat vitamin D level after 3 to 6 months.    Body mass index is 22.5 kg/m.  Nutrition Problem: Increased nutrient needs  Etiology: chronic illness (CHF) Interventions: Interventions: MVI, Liberalize Diet   Diet: Regular diet DVT Prophylaxis: Therapeutic Anticoagulation with Eliquis    Advance goals of care discussion: Full code  Family Communication:  family was Not present at bedside, at the time of interview.  The pt provided permission to discuss medical plan with the family. Opportunity was given to ask question and all questions were answered satisfactorily.   Disposition:  Pt is homeless, admitted with EtOH abuse, pulmonary embolism, A-fib with RVR, still has AMS, which precludes a safe discharge. Discharge to SNF, when stable.  TOC following for placement  Subjective: No significant overnight events, patient was confused and had soft restraints in bilateral arms, patient was sleepy and unable to offer any complaints, AAO x 1.  Physical Exam: General: NAD, lying comfortably Appear in no distress, affect appropriate Eyes: PERRLA ENT: Oral Mucosa Clear, moist  Neck: no JVD,  Cardiovascular: S1 and S2 Present, no Murmur,  Respiratory: good respiratory effort, Bilateral Air entry equal and Decreased, no Crackles, no wheezes Abdomen: Bowel Sound present, Soft and no tenderness,  Skin: no rashes Extremities: no Pedal edema, no calf tenderness Neurologic: without any new focal findings Gait not checked due to patient safety concerns  Vitals:   02/10/23 0405 02/10/23 0500 02/10/23 0800 02/10/23 1200  BP: 120/79  109/80 (!) 111/91  Pulse: 84  82 77  Resp: 18  16 15   Temp: 97.8 F (36.6 C)  97.6 F (36.4 C) 98.4 F (36.9 C)  TempSrc: Oral  Axillary Axillary  SpO2: 99%  96% 99%  Weight:  69.1 kg    Height:        Intake/Output Summary (Last 24 hours) at 02/10/2023 1745 Last data filed at 02/10/2023 1300 Gross per 24 hour  Intake 240 ml  Output 950 ml  Net -710 ml   Filed Weights   02/08/23 0500 02/09/23 0414 02/10/23 0500  Weight: 75.2 kg 71.3 kg 69.1 kg    Data Reviewed: I have personally reviewed and interpreted daily labs, tele strips, imagings as discussed above. I reviewed all nursing notes, pharmacy notes, vitals, pertinent old records I have discussed plan of care as described above with RN and  patient/family.  CBC: Recent Labs  Lab 02/06/23 0326 02/07/23 0641 02/08/23 0309 02/09/23 0624 02/10/23 0222  WBC 4.0 4.5 6.0 4.9 4.1  NEUTROABS  --  2.5 3.7 2.6 2.2  HGB 8.5* 8.8* 9.9* 8.9* 9.2*  HCT 25.5* 26.5* 30.3* 27.1* 28.2*  MCV 100.0 99.6 104.5* 104.6* 104.8*  PLT 223 223 283 253 274   Basic Metabolic Panel: Recent Labs  Lab 02/04/23 0051 02/04/23 0226 02/05/23 0744 02/07/23 0641 02/08/23 0309 02/09/23 0624 02/10/23 0222  NA 121*   < > 124* 131* 134* 135 139  K 4.7   < > 3.6 3.5 4.4 4.4 4.0  CL 93*   < > 100 105 105 110 108  CO2 12*   < > 15* 17* 18* 17* 17*  GLUCOSE 133*   < > 122* 97 92 86 82  BUN 6*   < > 9 14 17 17 18   CREATININE 0.82   < > 0.87 1.07 1.16 0.99 1.04  CALCIUM 7.5*   < > 7.0* 7.5* 8.0* 8.1* 8.2*  MG 1.3*  1.3*  --   --  1.4* 2.2 1.7 1.4*  PHOS 3.4  3.3  --   --   --   --   --   --    < > =  values in this interval not displayed.    Studies: No results found.  Scheduled Meds:  (feeding supplement) PROSource Plus  30 mL Oral BID BM   amiodarone  200 mg Oral BID   [START ON 02/15/2023] amiodarone  200 mg Oral Daily   apixaban  10 mg Oral BID   Followed by   Melene Muller ON 02/13/2023] apixaban  5 mg Oral BID   chlordiazePOXIDE  10 mg Oral TID   ferrous sulfate  325 mg Oral Q breakfast   folic acid  1 mg Oral Daily   guaiFENesin  600 mg Oral BID   lidocaine  3 patch Transdermal Q24H   metoprolol tartrate  25 mg Oral BID   midodrine  10 mg Oral TID WC   mometasone-formoterol  2 puff Inhalation BID   multivitamin with minerals  1 tablet Oral Daily   pantoprazole  40 mg Oral Daily   polyethylene glycol  17 g Oral BID   QUEtiapine  50 mg Oral BID   rosuvastatin  10 mg Oral Daily   sodium bicarbonate  650 mg Oral TID   [START ON 02/12/2023] thiamine  100 mg Oral Daily   Vitamin D (Ergocalciferol)  50,000 Units Oral Q7 days   Continuous Infusions:  thiamine (VITAMIN B1) injection 250 mg (02/10/23 0952)   PRN Meds: [COMPLETED] acetaminophen  **FOLLOWED BY** acetaminophen, albuterol, [DISCONTINUED] ondansetron **OR** ondansetron (ZOFRAN) IV, traMADol  Time spent: 35 minutes  Author: Gillis Santa. MD [facility] 02/10/2023 5:45 PM  To reach On-call, see care teams to locate the attending and reach out to them via [email or web address]. If 7PM-7AM, please contact night-coverage If you still have difficulty reaching the attending provider, please page the [facility] (Director on Call) for [facility] on amion for assistance.

## 2023-02-11 ENCOUNTER — Other Ambulatory Visit (HOSPITAL_COMMUNITY): Payer: Self-pay

## 2023-02-11 DIAGNOSIS — I2694 Multiple subsegmental pulmonary emboli without acute cor pulmonale: Secondary | ICD-10-CM | POA: Diagnosis not present

## 2023-02-11 LAB — COMPREHENSIVE METABOLIC PANEL
ALT: 23 U/L (ref 0–44)
AST: 43 U/L — ABNORMAL HIGH (ref 15–41)
Albumin: 2.5 g/dL — ABNORMAL LOW (ref 3.5–5.0)
Alkaline Phosphatase: 116 U/L (ref 38–126)
Anion gap: 12 (ref 5–15)
BUN: 13 mg/dL (ref 8–23)
CO2: 20 mmol/L — ABNORMAL LOW (ref 22–32)
Calcium: 8 mg/dL — ABNORMAL LOW (ref 8.9–10.3)
Chloride: 107 mmol/L (ref 98–111)
Creatinine, Ser: 0.83 mg/dL (ref 0.61–1.24)
GFR, Estimated: >60 mL/min (ref >60)
Glucose, Bld: 71 mg/dL (ref 70–99)
Potassium: 4.1 mmol/L (ref 3.5–5.1)
Sodium: 139 mmol/L (ref 135–145)
Total Bilirubin: 1.1 mg/dL (ref 0.3–1.2)
Total Protein: 5.6 g/dL — ABNORMAL LOW (ref 6.5–8.1)

## 2023-02-11 LAB — PHOSPHORUS: Phosphorus: 4 mg/dL (ref 2.5–4.6)

## 2023-02-11 LAB — MAGNESIUM: Magnesium: 1.7 mg/dL (ref 1.7–2.4)

## 2023-02-11 NOTE — TOC Benefit Eligibility Note (Signed)
Pharmacy Patient Advocate Encounter  Insurance verification completed.    The patient is insured through [employer] Medicare Part D  Ran test claim for Eliquis 5 mg and the current 30 day co-pay is $0.00.   This test claim was processed through [facility]- copay amounts may vary at other pharmacies due to pharmacy/plan contracts, or as the patient moves through the different stages of their insurance plan.    Jeffrey Smith, CPHT Pharmacy Patient Advocate Specialist [facility] Patient Advocate Team Direct Number: [phone]  Fax: [phone] 

## 2023-02-11 NOTE — Progress Notes (Signed)
PT Cancellation Note  Patient Details Name: Anthony [name] MRN: [id number] DOB: October 20, 1955   Cancelled Treatment:    Reason Eval/Treat Not Completed: Patient declined, no reason specified  Patient very confused (thinks he has been kidnapped and needs to call the [place] [employer]). Unable to persuade pt to participate. He became agitated when trying to don wrist restraints, however was able to easily calm down and allowed them to be replaced.    Jerolyn Center, PT Acute Rehabilitation Services  Office [phone]  Anthony [name] 02/11/2023, 4:14 PM

## 2023-02-11 NOTE — Progress Notes (Signed)
[facility] Progress Note  Patient: Anthony [name]    ZOX:[id number]  DOA: 02/03/2023     Date of Service: the patient was seen and examined on 02/11/2023  Chief Complaint  Patient presents with   Shortness of Breath   Hypoglycemia   Brief hospital course:  Brief Narrative (HPI from H&P)   67 y.o. male with medical history significant of alcohol abuse, anxiety, HTN, homelessness, CVA who presented with chest pain and shortness of breath.  He was found to have a pulmonary embolism as well as atrial fibrillation with RVR and soft blood pressures, hyponatremic with edema, cardiology was consulted and he was admitted to the hospital.  Transferred to my care on 02/06/2023  Assessment and Plan:  Acute Pulmonary Embolism with Infarct:  CT PE protocol with acute segmental and subsegmental PE in posterior RLL with associated developing pulmonary infarct in the lateral basal segment, negative lower extremity venous duplex.  Echo noted with reduction in RV systolic function.  Now transitioned from heparin drip to Eliquis.   Acute Fracture of L Lateral 3rd Rib with Associated Pulmonary Contusion:  Unchanged Comminuted Fracture of the L Clavicular Head and Subacute Fracture of the L Lateral 5th Rib, supportive care, PT OT, I-S for pulmonary toiletry.  Encouraged to sit in chair in daytime.  Monitor.   Atrial Fibrillation with RVR: Advised to score of at least 4.  Echo noted, discussed with cardiology on 02/06/2023 Dr. Jens Som, TSH stable currently on amiodarone which will be continued, Eliquis.  Acute on chronic systolic left-sided heart failure along with RV systolic dysfunction as well.  Hold further fluids, diurese as tolerated by blood pressure and monitor.  Blood pressure too low for ACE/ARB/Entresto, low-dose beta-blocker if tolerated.  Seen by cardiology this admission nothing else to offer.   New right-sided pleural effusion.  Seen by pulmonary, repeat CT checked on 02/06/2023, does not look like  hemothorax, underwent bedside ultrasound-guided thoracentesis on 02/06/2023 fluid appears to be transudative, outpatient pulmonary follow-up postdischarge.   Repeat chest x-ray on 02/07/2023 Small to moderate right pleural effusion with associated atelectasis/airspace disease, slightly increased compared to prior exam.   Hyponatremia  - Has bilateral LE edema on exam, further IV fluids and diurese with Lasix and monitor   Non anion gap metabolic acidosis - Likely contraction alkalosis from intravascular depletion, third spacing of fluids and to some extent lactic acidosis.  Has been adequately hydrated.  Add protein supplements, diurese, this problem has resolved.   Etoh Abuse  Drinks 6 pack daily consult to quit, on Librium taper along with CIWA protocol.   Metabolic encephalopathy.  Getting worse at nighttime likely due to hospital delirium, ripping IVs, has had 10 IVs placed so far, placed on Seroquel twice daily, QTc on telemetry on 02/08/2023 is 420 ms.  Encephalopathy has improved.   Dyslipidemia  Crestor   Hypomagnesemia   Replaced, Continue to monitor   Chronic hypotension   blood pressure gradually improving, he is chronically on midodrine which will be continued   Hypoglycemia  Due to poor oral intake, stable random TSH and a.m. cortisol, monitor CBGs.  Metabolic acidosis, started oral bicarbonate.  Iron deficiency, transferrin saturation 6%, continue oral iron supplement.  Follow with PCP to repeat iron profile after 3 to 6 months. B12 and folic acid within normal range Vitamin D Insufficiency: started vitamin D 50,000 units p.o. weekly, follow with PCP to repeat vitamin D level after 3 to 6 months.    Body mass index is 20.25  kg/m.  Nutrition Problem: Increased nutrient needs Etiology: chronic illness (CHF) Interventions: Interventions: MVI, Liberalize Diet   Diet: Regular diet DVT Prophylaxis: Therapeutic Anticoagulation with Eliquis    Advance goals of care  discussion: Full code  Family Communication: family was Not present at bedside, at the time of interview.  The pt provided permission to discuss medical plan with the family. Opportunity was given to ask question and all questions were answered satisfactorily.   Disposition:  Pt is homeless, admitted with EtOH abuse, pulmonary embolism, A-fib with RVR, still has AMS, which precludes a safe discharge. Discharge to SNF, when stable.  TOC following for placement  Subjective: No significant overnight events, patient is still very confused and disoriented, he was in restraints and having delusional thoughts.  Physical Exam: General: NAD, lying comfortably Appear in no distress, affect appropriate Eyes: PERRLA ENT: Oral Mucosa Clear, moist  Neck: no JVD,  Cardiovascular: S1 and S2 Present, no Murmur,  Respiratory: good respiratory effort, Bilateral Air entry equal and Decreased, no Crackles, no wheezes Abdomen: Bowel Sound present, Soft and no tenderness,  Skin: no rashes Extremities: no Pedal edema, no calf tenderness Neurologic: without any new focal findings Gait not checked due to patient safety concerns  Vitals:   02/11/23 0200 02/11/23 0300 02/11/23 0400 02/11/23 0802  BP:   105/66 102/68  Pulse:   81 79  Resp: (!) 28  (!) 29 (!) 23  Temp:   97.7 F (36.5 C) 97.8 F (36.6 C)  TempSrc:   Axillary Axillary  SpO2:   98% 98%  Weight:  62.2 kg    Height:        Intake/Output Summary (Last 24 hours) at 02/11/2023 1542 Last data filed at 02/11/2023 0453 Gross per 24 hour  Intake --  Output 500 ml  Net -500 ml   Filed Weights   02/09/23 0414 02/10/23 0500 02/11/23 0300  Weight: 71.3 kg 69.1 kg 62.2 kg    Data Reviewed: I have personally reviewed and interpreted daily labs, tele strips, imagings as discussed above. I reviewed all nursing notes, pharmacy notes, vitals, pertinent old records I have discussed plan of care as described above with RN and  patient/family.  CBC: Recent Labs  Lab 02/06/23 0326 02/07/23 0641 02/08/23 0309 02/09/23 0624 02/10/23 0222  WBC 4.0 4.5 6.0 4.9 4.1  NEUTROABS  --  2.5 3.7 2.6 2.2  HGB 8.5* 8.8* 9.9* 8.9* 9.2*  HCT 25.5* 26.5* 30.3* 27.1* 28.2*  MCV 100.0 99.6 104.5* 104.6* 104.8*  PLT 223 223 283 253 274   Basic Metabolic Panel: Recent Labs  Lab 02/07/23 0641 02/08/23 0309 02/09/23 0624 02/10/23 0222 02/11/23 0609  NA 131* 134* 135 139 139  K 3.5 4.4 4.4 4.0 4.1  CL 105 105 110 108 107  CO2 17* 18* 17* 17* 20*  GLUCOSE 97 92 86 82 71  BUN 14 17 17 18 13   CREATININE 1.07 1.16 0.99 1.04 0.83  CALCIUM 7.5* 8.0* 8.1* 8.2* 8.0*  MG 1.4* 2.2 1.7 1.4* 1.7  PHOS  --   --   --   --  4.0    Studies: No results found.  Scheduled Meds:  (feeding supplement) PROSource Plus  30 mL Oral BID BM   amiodarone  200 mg Oral BID   [START ON 02/15/2023] amiodarone  200 mg Oral Daily   apixaban  10 mg Oral BID   Followed by   Melene Muller ON 02/13/2023] apixaban  5 mg Oral BID   chlordiazePOXIDE  10 mg Oral BID   Followed by   Melene Muller ON 02/12/2023] chlordiazePOXIDE  5 mg Oral BID   ferrous sulfate  325 mg Oral Q breakfast   folic acid  1 mg Oral Daily   guaiFENesin  600 mg Oral BID   lidocaine  3 patch Transdermal Q24H   metoprolol tartrate  25 mg Oral BID   midodrine  10 mg Oral TID WC   mometasone-formoterol  2 puff Inhalation BID   multivitamin with minerals  1 tablet Oral Daily   pantoprazole  40 mg Oral Daily   polyethylene glycol  17 g Oral BID   QUEtiapine  50 mg Oral BID   rosuvastatin  10 mg Oral Daily   sodium bicarbonate  650 mg Oral TID   [START ON 02/12/2023] thiamine  100 mg Oral Daily   Vitamin D (Ergocalciferol)  50,000 Units Oral Q7 days   Continuous Infusions:   PRN Meds: [COMPLETED] acetaminophen **FOLLOWED BY** acetaminophen, albuterol, [DISCONTINUED] ondansetron **OR** ondansetron (ZOFRAN) IV, traMADol  Time spent: 35 minutes  Author: Gillis Santa. MD [facility] 02/11/2023 3:42 PM  To reach On-call, see care teams to locate the attending and reach out to them via [email or web address]. If 7PM-7AM, please contact night-coverage If you still have difficulty reaching the attending provider, please page the [facility] (Director on Call) for [facility] on amion for assistance.

## 2023-02-11 NOTE — Progress Notes (Signed)
OT Cancellation Note  Patient Details Name: Anthony [name] MRN: [id number] DOB: Jul 14, 1956   Cancelled Treatment:    Reason Eval/Treat Not Completed: Other (comment) (Pt with significant confusion this day limiting ability to participate. Pt initially stating he had been kidnapped and needed to call the [place] [employer]. Pt reoriented to place/situation with cues, but with pt unable to participate further.)  Pt perseverating on wanting to leave hospital and engage in self-directed activities. Pt educated in role of skilled rehab to increase Independence and support discharge but with pt demonstrating poor understanding and continuing to refuse sitting EOB, OOB activity in room, and participation from bed level. Pt with increased agitation when re-donning bed wrist restraints after attempting session, but with pt calming quickly and easily to allow donning. RN notified of pt current mental status and of significant noted change in mental status since OT evaluation on 02/05/23. OT to continue to follow pt acutely as appropriate.  Tyshawna Alarid "Orson Eva., OTR/L, MA Acute Rehab [phone]   Lendon Colonel 02/11/2023, 6:33 PM

## 2023-02-12 DIAGNOSIS — I2694 Multiple subsegmental pulmonary emboli without acute cor pulmonale: Secondary | ICD-10-CM | POA: Diagnosis not present

## 2023-02-12 LAB — GLUCOSE, CAPILLARY: Glucose-Capillary: 91 mg/dL (ref 70–99)

## 2023-02-12 MED ORDER — MAGNESIUM SULFATE 4 GM/100ML IV SOLN
4.0000 g | Freq: Once | INTRAVENOUS | Status: AC
  Administered 2023-02-12: 4 g via INTRAVENOUS
  Filled 2023-02-12: qty 100

## 2023-02-12 MED ORDER — ENSURE ENLIVE PO LIQD
237.0000 mL | Freq: Two times a day (BID) | ORAL | Status: DC
  Administered 2023-02-12 – 2023-02-16 (×8): 237 mL via ORAL

## 2023-02-12 NOTE — Progress Notes (Signed)
[facility] Progress Note  Patient: Anthony [name]    ZHY:[id number]  DOA: 02/03/2023     Date of Service: the patient was seen and examined on 02/12/2023  Chief Complaint  Patient presents with   Shortness of Breath   Hypoglycemia   Brief hospital course:  Brief Narrative (HPI from H&P)   67 y.o. male with medical history significant of alcohol abuse, anxiety, HTN, homelessness, CVA who presented with chest pain and shortness of breath.  He was found to have a pulmonary embolism as well as atrial fibrillation with RVR and soft blood pressures, hyponatremic with edema, cardiology was consulted and he was admitted to the hospital.  Transferred to my care on 02/06/2023  Assessment and Plan:  Acute Pulmonary Embolism with Infarct:  CT PE protocol with acute segmental and subsegmental PE in posterior RLL with associated developing pulmonary infarct in the lateral basal segment, negative lower extremity venous duplex.  Echo noted with reduction in RV systolic function.  Now transitioned from heparin drip to Eliquis.   Acute Fracture of L Lateral 3rd Rib with Associated Pulmonary Contusion:  Unchanged Comminuted Fracture of the L Clavicular Head and Subacute Fracture of the L Lateral 5th Rib, supportive care, PT OT, I-S for pulmonary toiletry.  Encouraged to sit in chair in daytime.  Monitor.   Atrial Fibrillation with RVR: Advised to score of at least 4.  Echo noted, discussed with cardiology on 02/06/2023 Dr. Jens Som, TSH stable currently on amiodarone which will be continued, Eliquis.  Acute on chronic systolic left-sided heart failure along with RV systolic dysfunction as well.  Hold further fluids, diurese as tolerated by blood pressure and monitor.  Blood pressure too low for ACE/ARB/Entresto, low-dose beta-blocker if tolerated.  Seen by cardiology this admission nothing else to offer.   New right-sided pleural effusion.  Seen by pulmonary, repeat CT checked on 02/06/2023, does not look like  hemothorax, underwent bedside ultrasound-guided thoracentesis on 02/06/2023 fluid appears to be transudative, outpatient pulmonary follow-up postdischarge.   Repeat chest x-ray on 02/07/2023 Small to moderate right pleural effusion with associated atelectasis/airspace disease, slightly increased compared to prior exam.   Hyponatremia  - Has bilateral LE edema on exam, further IV fluids and diurese with Lasix and monitor   Non anion gap metabolic acidosis - Likely contraction alkalosis from intravascular depletion, third spacing of fluids and to some extent lactic acidosis.  Has been adequately hydrated.  Add protein supplements, diurese, this problem has resolved.   Etoh Abuse  Drinks 6 pack daily consult to quit, on Librium taper along with CIWA protocol.   Metabolic encephalopathy.  Getting worse at nighttime likely due to hospital delirium, ripping IVs, has had 10 IVs placed so far, placed on Seroquel twice daily, QTc on telemetry on 02/08/2023 is 420 ms.  Encephalopathy has improved.   Dyslipidemia  Crestor   Hypomagnesemia   Replaced, Continue to monitor   Chronic hypotension   blood pressure gradually improving, he is chronically on midodrine which will be continued   Hypoglycemia  Due to poor oral intake, stable random TSH and a.m. cortisol, monitor CBGs.  Metabolic acidosis, started oral bicarbonate.  Iron deficiency, transferrin saturation 6%, continue oral iron supplement.  Follow with PCP to repeat iron profile after 3 to 6 months. B12 and folic acid within normal range Vitamin D Insufficiency: started vitamin D 50,000 units p.o. weekly, follow with PCP to repeat vitamin D level after 3 to 6 months.    Body mass index is 20.25  kg/m.  Nutrition Problem: Increased nutrient needs Etiology: chronic illness (CHF) Interventions: Interventions: MVI, Liberalize Diet   Diet: Regular diet DVT Prophylaxis: Therapeutic Anticoagulation with Eliquis    Advance goals of care  discussion: Full code  Family Communication: family was Not present at bedside, at the time of interview.  The pt provided permission to discuss medical plan with the family. Opportunity was given to ask question and all questions were answered satisfactorily.   Disposition:  Pt is homeless, admitted with EtOH abuse, pulmonary embolism, A-fib with RVR, still has AMS, which precludes a safe discharge. Discharge to SNF, when stable.  TOC following for placement  Subjective: No significant overnight events, patient is out of the restraints but still confused.  Patient was sleepy and he pulled his IV line, dried blood was noticed on the bed.  No acute bleeding.  Patient stated that he was sleeping and it came out, and he was not trying to pull it out. RN advised to keep the patient out of the restraints and keep sitter in the room to watch patient. Patient is AOx3 today.  Still has delusional thoughts. Patient stated that he had very violent dream  Physical Exam: General: NAD, lying comfortably Appear in no distress, affect appropriate Eyes: PERRLA ENT: Oral Mucosa Clear, moist  Neck: no JVD,  Cardiovascular: S1 and S2 Present, no Murmur,  Respiratory: good respiratory effort, Bilateral Air entry equal and Decreased, no Crackles, no wheezes Abdomen: Bowel Sound present, Soft and no tenderness,  Skin: no rashes Extremities: no Pedal edema, no calf tenderness Neurologic: without any new focal findings Gait not checked due to patient safety concerns  Vitals:   02/12/23 0410 02/12/23 0807 02/12/23 0832 02/12/23 1131  BP: 102/71  109/77 103/69  Pulse: 84  90 86  Resp: (!) 22  20 17   Temp: 98.2 F (36.8 C)  98.4 F (36.9 C) 98.2 F (36.8 C)  TempSrc: Oral  Oral   SpO2: 96% 97% 94% 95%  Weight:      Height:        Intake/Output Summary (Last 24 hours) at 02/12/2023 1511 Last data filed at 02/12/2023 0900 Gross per 24 hour  Intake 120 ml  Output 750 ml  Net -630 ml   Filed Weights    02/09/23 0414 02/10/23 0500 02/11/23 0300  Weight: 71.3 kg 69.1 kg 62.2 kg    Data Reviewed: I have personally reviewed and interpreted daily labs, tele strips, imagings as discussed above. I reviewed all nursing notes, pharmacy notes, vitals, pertinent old records I have discussed plan of care as described above with RN and patient/family.  CBC: Recent Labs  Lab 02/06/23 0326 02/07/23 0641 02/08/23 0309 02/09/23 0624 02/10/23 0222  WBC 4.0 4.5 6.0 4.9 4.1  NEUTROABS  --  2.5 3.7 2.6 2.2  HGB 8.5* 8.8* 9.9* 8.9* 9.2*  HCT 25.5* 26.5* 30.3* 27.1* 28.2*  MCV 100.0 99.6 104.5* 104.6* 104.8*  PLT 223 223 283 253 274   Basic Metabolic Panel: Recent Labs  Lab 02/07/23 0641 02/08/23 0309 02/09/23 0624 02/10/23 0222 02/11/23 0609 02/12/23 0111  NA 131* 134* 135 139 139  --   K 3.5 4.4 4.4 4.0 4.1  --   CL 105 105 110 108 107  --   CO2 17* 18* 17* 17* 20*  --   GLUCOSE 97 92 86 82 71  --   BUN 14 17 17 18 13   --   CREATININE 1.07 1.16 0.99 1.04 0.83  --  CALCIUM 7.5* 8.0* 8.1* 8.2* 8.0*  --   MG 1.4* 2.2 1.7 1.4* 1.7 1.3*  PHOS  --   --   --   --  4.0 2.8    Studies: No results found.  Scheduled Meds:  (feeding supplement) PROSource Plus  30 mL Oral BID BM   amiodarone  200 mg Oral BID   [START ON 02/15/2023] amiodarone  200 mg Oral Daily   apixaban  10 mg Oral BID   Followed by   Melene Muller ON 02/13/2023] apixaban  5 mg Oral BID   chlordiazePOXIDE  5 mg Oral BID   ferrous sulfate  325 mg Oral Q breakfast   folic acid  1 mg Oral Daily   guaiFENesin  600 mg Oral BID   lidocaine  3 patch Transdermal Q24H   metoprolol tartrate  25 mg Oral BID   midodrine  10 mg Oral TID WC   mometasone-formoterol  2 puff Inhalation BID   multivitamin with minerals  1 tablet Oral Daily   pantoprazole  40 mg Oral Daily   polyethylene glycol  17 g Oral BID   QUEtiapine  50 mg Oral BID   rosuvastatin  10 mg Oral Daily   sodium bicarbonate  650 mg Oral TID   thiamine  100 mg Oral  Daily   Vitamin D (Ergocalciferol)  50,000 Units Oral Q7 days   Continuous Infusions:   PRN Meds: [COMPLETED] acetaminophen **FOLLOWED BY** acetaminophen, albuterol, [DISCONTINUED] ondansetron **OR** ondansetron (ZOFRAN) IV, traMADol  Time spent: 35 minutes  Author: Gillis Santa. MD [facility] 02/12/2023 3:11 PM  To reach On-call, see care teams to locate the attending and reach out to them via [email or web address]. If 7PM-7AM, please contact night-coverage If you still have difficulty reaching the attending provider, please page the [facility] (Director on Call) for [facility] on amion for assistance.

## 2023-02-12 NOTE — Plan of Care (Signed)

## 2023-02-12 NOTE — Progress Notes (Signed)
Nutrition Follow-up  DOCUMENTATION CODES:   Not applicable  INTERVENTION:  Ensure Plus High Protein po BID, each supplement provides 350 kcal and 20 grams of protein.   Communicate with RN   NUTRITION DIAGNOSIS:   Increased nutrient needs related to chronic illness (CHF) as evidenced by estimated needs. -ongoing   GOAL:   Patient will meet greater than or equal to 90% of their needs -progressing   MONITOR:   PO intake, Labs, Weight trends, I & O's  REASON FOR ASSESSMENT:   Consult Assessment of nutrition requirement/status  ASSESSMENT:   68 y.o. male presented to the ED with hypoglycemia and abdominal pain. PMH includes HTN, A. Fib, EtOH abuse, CHF, stroke, and malnutrition. Pt admitted with pulmonary embolism.  Patient is disoriented/confused. RN asked RD not to wake patient as he was sleeping upon arrival. She reports he has been pulling out his lines today.   She reports he has been eating and drinking Prosource. She thinks he would drink Ensure as well.   Labs: mag 1.3 Meds: Prosource BID (refused), ferrous sulfate, magnesium sulfate, MVI, protonix, miralax, seroquel, crestor, sodium bicarbonate, thiamine, vitamin D  Wt: admit-150#, current-137# PO: 58% avg po x 3 meals  I/O's:  -210 ml since admission   NUTRITION - FOCUSED PHYSICAL EXAM:  Unable to obtain due to patient sleeping and RN requesting not to to disturb him  Diet Order:   Diet Order             Diet regular Room service appropriate? No; Fluid consistency: Thin; Fluid restriction: 1200 mL Fluid  Diet effective now                   EDUCATION NEEDS:   No education needs have been identified at this time  Skin:  Skin Assessment: Reviewed RN Assessment  Last BM:  7/25 type 5  Height:   Ht Readings from Last 1 Encounters:  02/04/23 5\' 9"  (1.753 m)    Weight:   Wt Readings from Last 1 Encounters:  02/11/23 62.2 kg    Ideal Body Weight:  72.7 kg  BMI:  Body mass index is  20.25 kg/m.  Estimated Nutritional Needs:   Kcal:  1900-2100  Protein:  95-115 grams  Fluid:  >/= 1.9 L   Leodis Rains, RDN, LDN  Clinical Nutrition

## 2023-02-13 DIAGNOSIS — I2694 Multiple subsegmental pulmonary emboli without acute cor pulmonale: Secondary | ICD-10-CM | POA: Diagnosis not present

## 2023-02-13 MED ORDER — BISACODYL 10 MG RE SUPP: 10.0000 mg | Freq: Every day | RECTAL | Status: DC | PRN

## 2023-02-13 MED ORDER — BISACODYL 5 MG PO TBEC: 10.0000 mg | DELAYED_RELEASE_TABLET | Freq: Every day | ORAL | Status: DC | PRN

## 2023-02-13 MED ORDER — BISACODYL 5 MG PO TBEC
10.0000 mg | DELAYED_RELEASE_TABLET | Freq: Two times a day (BID) | ORAL | Status: AC
  Administered 2023-02-13 – 2023-02-15 (×4): 10 mg via ORAL
  Filled 2023-02-13 (×6): qty 2

## 2023-02-13 NOTE — Plan of Care (Signed)

## 2023-02-13 NOTE — Progress Notes (Signed)
[facility] Progress Note  Patient: Anthony [name]    ZOX:[id number]  DOA: 02/03/2023     Date of Service: the patient was seen and examined on 02/13/2023  Chief Complaint  Patient presents with   Shortness of Breath   Hypoglycemia   Brief hospital course:  Brief Narrative (HPI from H&P)   67 y.o. male with medical history significant of alcohol abuse, anxiety, HTN, homelessness, CVA who presented with chest pain and shortness of breath.  He was found to have a pulmonary embolism as well as atrial fibrillation with RVR and soft blood pressures, hyponatremic with edema, cardiology was consulted and he was admitted to the hospital.  Transferred to my care on 02/06/2023  Assessment and Plan:  Acute Pulmonary Embolism with Infarct:  CT PE protocol with acute segmental and subsegmental PE in posterior RLL with associated developing pulmonary infarct in the lateral basal segment, negative lower extremity venous duplex.  Echo noted with reduction in RV systolic function.  Now transitioned from heparin drip to Eliquis.   Acute Fracture of L Lateral 3rd Rib with Associated Pulmonary Contusion:  Unchanged Comminuted Fracture of the L Clavicular Head and Subacute Fracture of the L Lateral 5th Rib, supportive care, PT OT, I-S for pulmonary toiletry.  Encouraged to sit in chair in daytime.  Monitor.   Atrial Fibrillation with RVR: Advised to score of at least 4.  Echo noted, discussed with cardiology on 02/06/2023 Dr. Jens Som, TSH stable currently on amiodarone which will be continued, Eliquis.  Acute on chronic systolic left-sided heart failure along with RV systolic dysfunction as well.  Hold further fluids, diurese as tolerated by blood pressure and monitor.  Blood pressure too low for ACE/ARB/Entresto, low-dose beta-blocker if tolerated.  Seen by cardiology this admission nothing else to offer.   New right-sided pleural effusion.  Seen by pulmonary, repeat CT checked on 02/06/2023, does not look like  hemothorax, underwent bedside ultrasound-guided thoracentesis on 02/06/2023 fluid appears to be transudative, outpatient pulmonary follow-up postdischarge.   Repeat chest x-ray on 02/07/2023 Small to moderate right pleural effusion with associated atelectasis/airspace disease, slightly increased compared to prior exam.   Hyponatremia  - Has bilateral LE edema on exam, further IV fluids and diurese with Lasix and monitor   Non anion gap metabolic acidosis - Likely contraction alkalosis from intravascular depletion, third spacing of fluids and to some extent lactic acidosis.  Has been adequately hydrated.  Add protein supplements, diurese, this problem has resolved.   Etoh Abuse  Drinks 6 pack daily consult to quit, on Librium taper along with CIWA protocol.   Metabolic encephalopathy.  Getting worse at nighttime likely due to hospital delirium, ripping IVs, has had 10 IVs placed so far, placed on Seroquel twice daily, QTc on telemetry on 02/08/2023 is 420 ms.  Encephalopathy has improved.   Dyslipidemia  Crestor   Hypomagnesemia   Replaced, Continue to monitor   Chronic hypotension   blood pressure gradually improving, he is chronically on midodrine which will be continued   Hypoglycemia  Due to poor oral intake, stable random TSH and a.m. cortisol, monitor CBGs.  Metabolic acidosis, started oral bicarbonate.  Iron deficiency, transferrin saturation 6%, continue oral iron supplement.  Follow with PCP to repeat iron profile after 3 to 6 months. B12 and folic acid within normal range Vitamin D Insufficiency: started vitamin D 50,000 units p.o. weekly, follow with PCP to repeat vitamin D level after 3 to 6 months.    Body mass index is 20.25  kg/m.  Nutrition Problem: Increased nutrient needs Etiology: chronic illness (CHF) Interventions: Interventions: MVI, Liberalize Diet   Diet: Regular diet DVT Prophylaxis: Therapeutic Anticoagulation with Eliquis    Advance goals of care  discussion: Full code  Family Communication: family was Not present at bedside, at the time of interview.  The pt provided permission to discuss medical plan with the family. Opportunity was given to ask question and all questions were answered satisfactorily.   Disposition:  Pt is homeless, admitted with EtOH abuse, pulmonary embolism, A-fib with RVR, still has AMS, which precludes a safe discharge. Discharge to SNF, when stable.  TOC following for placement  Subjective: No significant overnight events, patient was sleepy, did not wake up, unable to offer any complaints.  Seems resting comfortably.  Physical Exam: General: NAD, lying comfortably Appear in no distress, affect appropriate Eyes: PERRLA ENT: Oral Mucosa Clear, moist  Neck: no JVD,  Cardiovascular: S1 and S2 Present, no Murmur,  Respiratory: good respiratory effort, Bilateral Air entry equal and Decreased, no Crackles, no wheezes Abdomen: Bowel Sound present, Soft and no tenderness,  Skin: no rashes Extremities: no Pedal edema, no calf tenderness Neurologic: without any new focal findings Gait not checked due to patient safety concerns  Vitals:   02/13/23 0000 02/13/23 0519 02/13/23 0855 02/13/23 1211  BP: 96/74 100/69 97/73 98/66   Pulse: 100   84  Resp: 19 18 18 17   Temp: 98.3 F (36.8 C)  97.9 F (36.6 C) 98 F (36.7 C)  TempSrc: Oral  Oral Oral  SpO2: 99%   95%  Weight:      Height:        Intake/Output Summary (Last 24 hours) at 02/13/2023 1520 Last data filed at 02/12/2023 1741 Gross per 24 hour  Intake --  Output 500 ml  Net -500 ml   Filed Weights   02/09/23 0414 02/10/23 0500 02/11/23 0300  Weight: 71.3 kg 69.1 kg 62.2 kg    Data Reviewed: I have personally reviewed and interpreted daily labs, tele strips, imagings as discussed above. I reviewed all nursing notes, pharmacy notes, vitals, pertinent old records I have discussed plan of care as described above with RN and  patient/family.  CBC: Recent Labs  Lab 02/07/23 0641 02/08/23 0309 02/09/23 0624 02/10/23 0222 02/13/23 0347  WBC 4.5 6.0 4.9 4.1 6.2  NEUTROABS 2.5 3.7 2.6 2.2  --   HGB 8.8* 9.9* 8.9* 9.2* 8.7*  HCT 26.5* 30.3* 27.1* 28.2* 27.1*  MCV 99.6 104.5* 104.6* 104.8* 102.3*  PLT 223 283 253 274 241   Basic Metabolic Panel: Recent Labs  Lab 02/08/23 0309 02/09/23 0624 02/10/23 0222 02/11/23 0609 02/12/23 0111 02/13/23 0347  NA 134* 135 139 139  --  137  K 4.4 4.4 4.0 4.1  --  4.2  CL 105 110 108 107  --  105  CO2 18* 17* 17* 20*  --  24  GLUCOSE 92 86 82 71  --  91  BUN 17 17 18 13   --  7*  CREATININE 1.16 0.99 1.04 0.83  --  0.73  CALCIUM 8.0* 8.1* 8.2* 8.0*  --  8.0*  MG 2.2 1.7 1.4* 1.7 1.3* 1.6*  PHOS  --   --   --  4.0 2.8 3.3    Studies: No results found.  Scheduled Meds:  (feeding supplement) PROSource Plus  30 mL Oral BID BM   amiodarone  200 mg Oral BID   [START ON 02/15/2023] amiodarone  200 mg Oral Daily  apixaban  10 mg Oral BID   Followed by   apixaban  5 mg Oral BID   bisacodyl  10 mg Oral BID   feeding supplement  237 mL Oral BID BM   ferrous sulfate  325 mg Oral Q breakfast   folic acid  1 mg Oral Daily   guaiFENesin  600 mg Oral BID   lidocaine  3 patch Transdermal Q24H   metoprolol tartrate  25 mg Oral BID   midodrine  10 mg Oral TID WC   mometasone-formoterol  2 puff Inhalation BID   multivitamin with minerals  1 tablet Oral Daily   pantoprazole  40 mg Oral Daily   polyethylene glycol  17 g Oral BID   QUEtiapine  50 mg Oral BID   rosuvastatin  10 mg Oral Daily   thiamine  100 mg Oral Daily   Vitamin D (Ergocalciferol)  50,000 Units Oral Q7 days   Continuous Infusions:   PRN Meds: [COMPLETED] acetaminophen **FOLLOWED BY** acetaminophen, albuterol, bisacodyl **FOLLOWED BY** [START ON 02/16/2023] bisacodyl, bisacodyl, [DISCONTINUED] ondansetron **OR** ondansetron (ZOFRAN) IV, traMADol  Time spent: 35 minutes  Author: Gillis Santa.  MD [facility] 02/13/2023 3:20 PM  To reach On-call, see care teams to locate the attending and reach out to them via [email or web address]. If 7PM-7AM, please contact night-coverage If you still have difficulty reaching the attending provider, please page the [facility] (Director on Call) for [facility] on amion for assistance.

## 2023-02-14 DIAGNOSIS — I2694 Multiple subsegmental pulmonary emboli without acute cor pulmonale: Secondary | ICD-10-CM | POA: Diagnosis not present

## 2023-02-14 LAB — BASIC METABOLIC PANEL WITH GFR
Anion gap: 10 (ref 5–15)
BUN: 11 mg/dL (ref 8–23)
CO2: 24 mmol/L (ref 22–32)
Calcium: 8.5 mg/dL — ABNORMAL LOW (ref 8.9–10.3)
Chloride: 104 mmol/L (ref 98–111)
Creatinine, Ser: 0.79 mg/dL (ref 0.61–1.24)
GFR, Estimated: >60 mL/min (ref >60)
Glucose, Bld: 85 mg/dL (ref 70–99)
Potassium: 4.1 mmol/L (ref 3.5–5.1)
Sodium: 138 mmol/L (ref 135–145)

## 2023-02-14 LAB — PHOSPHORUS: Phosphorus: 3.7 mg/dL (ref 2.5–4.6)

## 2023-02-14 LAB — CBC
HCT: 27.5 % — ABNORMAL LOW (ref 39.0–52.0)
Hemoglobin: 8.8 g/dL — ABNORMAL LOW (ref 13.0–17.0)
MCH: 34.2 pg — ABNORMAL HIGH (ref 26.0–34.0)
MCHC: 32 g/dL (ref 30.0–36.0)
MCV: 107 fL — ABNORMAL HIGH (ref 80.0–100.0)
Platelets: 243 10*3/uL (ref 150–400)
RBC: 2.57 MIL/uL — ABNORMAL LOW (ref 4.22–5.81)
RDW: 23.9 % — ABNORMAL HIGH (ref 11.5–15.5)
WBC: 5.9 10*3/uL (ref 4.0–10.5)
nRBC: 0 % (ref 0.0–0.2)

## 2023-02-14 LAB — MAGNESIUM: Magnesium: 1.3 mg/dL — ABNORMAL LOW (ref 1.7–2.4)

## 2023-02-14 MED ORDER — HYDROXYZINE HCL 25 MG PO TABS
25.0000 mg | ORAL_TABLET | Freq: Three times a day (TID) | ORAL | Status: DC | PRN
  Administered 2023-02-14 – 2023-02-15 (×3): 25 mg via ORAL
  Filled 2023-02-14 (×3): qty 1

## 2023-02-14 MED ORDER — MAGNESIUM SULFATE 4 GM/100ML IV SOLN
4.0000 g | Freq: Two times a day (BID) | INTRAVENOUS | Status: DC
  Filled 2023-02-14: qty 100

## 2023-02-14 MED ORDER — MAGNESIUM OXIDE -MG SUPPLEMENT 400 (240 MG) MG PO TABS
800.0000 mg | ORAL_TABLET | Freq: Three times a day (TID) | ORAL | Status: AC
  Administered 2023-02-14 (×3): 800 mg via ORAL
  Filled 2023-02-14 (×3): qty 2

## 2023-02-14 NOTE — Progress Notes (Signed)
[facility] Progress Note  Patient: Anthony [name]    ZOX:[id number]  DOA: 02/03/2023     Date of Service: the patient was seen and examined on 02/14/2023  Chief Complaint  Patient presents with   Shortness of Breath   Hypoglycemia   Brief hospital course:  Brief Narrative (HPI from H&P)   67 y.o. male with medical history significant of alcohol abuse, anxiety, HTN, homelessness, CVA who presented with chest pain and shortness of breath.  He was found to have a pulmonary embolism as well as atrial fibrillation with RVR and soft blood pressures, hyponatremic with edema, cardiology was consulted and he was admitted to the hospital.  Transferred to my care on 02/06/2023  Assessment and Plan:  Acute Pulmonary Embolism with Infarct:  CT PE protocol with acute segmental and subsegmental PE in posterior RLL with associated developing pulmonary infarct in the lateral basal segment, negative lower extremity venous duplex.  Echo noted with reduction in RV systolic function.  Now transitioned from heparin drip to Eliquis.   Acute Fracture of L Lateral 3rd Rib with Associated Pulmonary Contusion:  Unchanged Comminuted Fracture of the L Clavicular Head and Subacute Fracture of the L Lateral 5th Rib, supportive care, PT OT, I-S for pulmonary toiletry.  Encouraged to sit in chair in daytime.  Monitor.   Atrial Fibrillation with RVR: Advised to score of at least 4.  Echo noted, discussed with cardiology on 02/06/2023 Dr. Jens Som, TSH stable currently on amiodarone which will be continued, Eliquis.  Acute on chronic systolic left-sided heart failure along with RV systolic dysfunction as well.  Hold further fluids, diurese as tolerated by blood pressure and monitor.  Blood pressure too low for ACE/ARB/Entresto, low-dose beta-blocker if tolerated.  Seen by cardiology this admission nothing else to offer.   New right-sided pleural effusion.  Seen by pulmonary, repeat CT checked on 02/06/2023, does not look like  hemothorax, underwent bedside ultrasound-guided thoracentesis on 02/06/2023 fluid appears to be transudative, outpatient pulmonary follow-up postdischarge.      Hyponatremia  - Has bilateral LE edema on exam, further IV fluids and diurese with Lasix and monitor   Non anion gap metabolic acidosis - Likely contraction alkalosis from intravascular depletion, third spacing of fluids and to some extent lactic acidosis.  Has been adequately hydrated.  Add protein supplements, diurese, this problem has resolved.   Etoh Abuse  Drinks 6 pack daily consult to quit, was on Librium tapered & CIWA protocol.   Metabolic encephalopathy.  Getting worse at nighttime likely due to hospital delirium, ripping IVs, has had 10 IVs placed so far, placed on Seroquel twice daily, QTc on telemetry on 02/08/2023 is 420 ms.  Encephalopathy has improved.   Dyslipidemia  Crestor   Hypomagnesemia   Replaced, Continue to monitor   Chronic hypotension   blood pressure gradually improving, he is chronically on midodrine which will be continued   Hypoglycemia  Due to poor oral intake, stable random TSH and a.m. cortisol, monitor CBGs.  Metabolic acidosis, started oral bicarbonate.  Iron deficiency, transferrin saturation 6%, continue oral iron supplement.  Follow with PCP to repeat iron profile after 3 to 6 months. B12 and folic acid within normal range Vitamin D Insufficiency: started vitamin D 50,000 units p.o. weekly, follow with PCP to repeat vitamin D level after 3 to 6 months.    Body mass index is 21.72 kg/m.  Nutrition Problem: Increased nutrient needs Etiology: chronic illness (CHF) Interventions: Interventions: MVI, Liberalize Diet   Diet: Regular  diet DVT Prophylaxis: Therapeutic Anticoagulation with Eliquis    Advance goals of care discussion: Full code  Family Communication: family was Not present at bedside, at the time of interview.  The pt provided permission to discuss medical plan with the  family. Opportunity was given to ask question and all questions were answered satisfactorily.   Disposition:  Pt is homeless, admitted with EtOH abuse, pulmonary embolism, A-fib with RVR, still has AMS, which precludes a safe discharge. Discharge to SNF, when stable.  TOC following for placement  Subjective: Patient in bed, appears comfortable, denies any headache, no fever, no chest pain or pressure, no shortness of breath , no abdominal pain. No new focal weakness.   Physical Exam:  Awake but pleasantly confused, No new F.N deficits, Normal affect [place].AT,PERRAL Supple Neck, No JVD,   Symmetrical Chest wall movement, Good air movement bilaterally, CTAB RRR,No Gallops, Rubs or new Murmurs,  +ve B.Sounds, Abd Soft, No tenderness,   No Cyanosis, Clubbing or edema    Vitals:   02/14/23 0108 02/14/23 0442 02/14/23 0456 02/14/23 0831  BP: 99/79 107/72  112/80  Pulse: 77 87 85 88  Resp: (!) 23 (!) 22 14 20   Temp: 97.9 F (36.6 C) 97.9 F (36.6 C)  97.9 F (36.6 C)  TempSrc: Oral Oral  Oral  SpO2: 94% 99% 95% 97%  Weight:   66.7 kg   Height:        Intake/Output Summary (Last 24 hours) at 02/14/2023 1022 Last data filed at 02/13/2023 2100 Gross per 24 hour  Intake 420 ml  Output --  Net 420 ml   Filed Weights   02/10/23 0500 02/11/23 0300 02/14/23 0456  Weight: 69.1 kg 62.2 kg 66.7 kg    Data Reviewed:  Recent Labs  Lab 02/08/23 0309 02/09/23 0624 02/10/23 0222 02/13/23 0347 02/14/23 0107  WBC 6.0 4.9 4.1 6.2 5.9  HGB 9.9* 8.9* 9.2* 8.7* 8.8*  HCT 30.3* 27.1* 28.2* 27.1* 27.5*  PLT 283 253 274 241 243  MCV 104.5* 104.6* 104.8* 102.3* 107.0*  MCH 34.1* 34.4* 34.2* 32.8 34.2*  MCHC 32.7 32.8 32.6 32.1 32.0  RDW 23.4* 23.3* 23.9* 24.2* 23.9*  LYMPHSABS 1.5 1.6 1.2  --   --   MONOABS 0.7 0.6 0.6  --   --   EOSABS 0.0 0.1 0.1  --   --   BASOSABS 0.0 0.0 0.0  --   --     Recent Labs  Lab 02/08/23 0309 02/09/23 0624 02/10/23 0222 02/11/23 0609 02/12/23 0111  02/13/23 0347 02/14/23 0107  NA 134* 135 139 139  --  137 138  K 4.4 4.4 4.0 4.1  --  4.2 4.1  CL 105 110 108 107  --  105 104  CO2 18* 17* 17* 20*  --  24 24  ANIONGAP 11 8 14 12   --  8 10  GLUCOSE 92 86 82 71  --  91 85  BUN 17 17 18 13   --  7* 11  CREATININE 1.16 0.99 1.04 0.83  --  0.73 0.79  AST 47* 40 48* 43*  --   --   --   ALT 24 22 23 23   --   --   --   ALKPHOS 172* 146* 136* 116  --   --   --   BILITOT 0.6 0.7 0.9 1.1  --   --   --   ALBUMIN 2.7* 2.6* 2.7* 2.5*  --   --   --  BNP 1,747.4* 2,274.2* 1,666.7*  --   --   --   --   MG 2.2 1.7 1.4* 1.7 1.3* 1.6* 1.3*  CALCIUM 8.0* 8.1* 8.2* 8.0*  --  8.0* 8.5*    Recent Labs  Lab 02/08/23 0309 02/09/23 0624 02/10/23 0222 02/11/23 0609 02/12/23 0111 02/13/23 0347 02/14/23 0107  BNP 1,747.4* 2,274.2* 1,666.7*  --   --   --   --   MG 2.2 1.7 1.4* 1.7 1.3* 1.6* 1.3*  CALCIUM 8.0* 8.1* 8.2* 8.0*  --  8.0* 8.5*     Studies: No results found.  Scheduled Meds:  (feeding supplement) PROSource Plus  30 mL Oral BID BM   amiodarone  200 mg Oral BID   [START ON 02/15/2023] amiodarone  200 mg Oral Daily   apixaban  5 mg Oral BID   bisacodyl  10 mg Oral BID   feeding supplement  237 mL Oral BID BM   ferrous sulfate  325 mg Oral Q breakfast   folic acid  1 mg Oral Daily   guaiFENesin  600 mg Oral BID   lidocaine  3 patch Transdermal Q24H   magnesium oxide  800 mg Oral TID   metoprolol tartrate  25 mg Oral BID   midodrine  10 mg Oral TID WC   mometasone-formoterol  2 puff Inhalation BID   multivitamin with minerals  1 tablet Oral Daily   pantoprazole  40 mg Oral Daily   polyethylene glycol  17 g Oral BID   QUEtiapine  50 mg Oral BID   rosuvastatin  10 mg Oral Daily   thiamine  100 mg Oral Daily   Vitamin D (Ergocalciferol)  50,000 Units Oral Q7 days   Continuous Infusions:   PRN Meds: [COMPLETED] acetaminophen **FOLLOWED BY** acetaminophen, albuterol, bisacodyl **FOLLOWED BY** [START ON 02/16/2023] bisacodyl,  bisacodyl, hydrOXYzine, [DISCONTINUED] ondansetron **OR** ondansetron (ZOFRAN) IV, traMADol  Time spent: 35 minutes  Signature  -    Susa Raring M.D on 02/14/2023 at 10:22 AM   -  To page go to [email or web address]

## 2023-02-15 DIAGNOSIS — I2694 Multiple subsegmental pulmonary emboli without acute cor pulmonale: Secondary | ICD-10-CM | POA: Diagnosis not present

## 2023-02-15 MED ORDER — MAGNESIUM SULFATE 4 GM/100ML IV SOLN
4.0000 g | Freq: Once | INTRAVENOUS | Status: AC
  Administered 2023-02-15: 4 g via INTRAVENOUS
  Filled 2023-02-15: qty 100

## 2023-02-15 NOTE — Progress Notes (Signed)
[facility] Progress Note  Patient: Anthony [name]    BJY:[id number]  DOA: 02/03/2023     Date of Service: the patient was seen and examined on 02/15/2023  Chief Complaint  Patient presents with   Shortness of Breath   Hypoglycemia   Brief hospital course:  Brief Narrative (HPI from H&P)   67 y.o. male with medical history significant of alcohol abuse, anxiety, HTN, homelessness, CVA who presented with chest pain and shortness of breath.  He was found to have a pulmonary embolism as well as atrial fibrillation with RVR and soft blood pressures, hyponatremic with edema, cardiology was consulted and he was admitted to the hospital.  Transferred to my care on 02/06/2023  Assessment and Plan:  Acute Pulmonary Embolism with Infarct:  CT PE protocol with acute segmental and subsegmental PE in posterior RLL with associated developing pulmonary infarct in the lateral basal segment, negative lower extremity venous duplex.  Echo noted with reduction in RV systolic function.  Now transitioned from heparin drip to Eliquis.   Acute Fracture of L Lateral 3rd Rib with Associated Pulmonary Contusion:  Unchanged Comminuted Fracture of the L Clavicular Head and Subacute Fracture of the L Lateral 5th Rib, supportive care, PT OT, I-S for pulmonary toiletry.  Encouraged to sit in chair in daytime.  Monitor.   Atrial Fibrillation with RVR: Advised to score of at least 4.  Echo noted, discussed with cardiology on 02/06/2023 Dr. Jens Som, TSH stable currently on amiodarone which will be continued, Eliquis.  Acute on chronic systolic left-sided heart failure along with RV systolic dysfunction as well.  Hold further fluids, diurese as tolerated by blood pressure and monitor.  Blood pressure too low for ACE/ARB/Entresto, low-dose beta-blocker if tolerated.  Seen by cardiology this admission nothing else to offer.   New right-sided pleural effusion.  Seen by pulmonary, repeat CT checked on 02/06/2023, does not look like  hemothorax, underwent bedside ultrasound-guided thoracentesis on 02/06/2023 fluid appears to be transudative, outpatient pulmonary follow-up postdischarge.      Hyponatremia  - Has bilateral LE edema on exam, further IV fluids and diurese with Lasix and monitor   Non anion gap metabolic acidosis - Likely contraction alkalosis from intravascular depletion, third spacing of fluids and to some extent lactic acidosis.  Has been adequately hydrated.  Add protein supplements, diurese, this problem has resolved.   Etoh Abuse  Drinks 6 pack daily consult to quit, was on Librium tapered & CIWA protocol.   Metabolic encephalopathy.  Getting worse at nighttime likely due to hospital delirium, ripping IVs, has had 10 IVs placed so far, placed on Seroquel twice daily, QTc on telemetry on 02/08/2023 is 420 ms.  Encephalopathy has improved.   Dyslipidemia  Crestor   Hypomagnesemia   Replaced, Continue to monitor   Chronic hypotension   blood pressure gradually improving, he is chronically on midodrine which will be continued   Hypoglycemia  Due to poor oral intake, stable random TSH and a.m. cortisol, monitor CBGs.  Metabolic acidosis, started oral bicarbonate.  Iron deficiency, transferrin saturation 6%, continue oral iron supplement.  Follow with PCP to repeat iron profile after 3 to 6 months. B12 and folic acid within normal range Vitamin D Insufficiency: started vitamin D 50,000 units p.o. weekly, follow with PCP to repeat vitamin D level after 3 to 6 months.    Body mass index is 21.72 kg/m.  Nutrition Problem: Increased nutrient needs Etiology: chronic illness (CHF) Interventions: Interventions: MVI, Liberalize Diet   Diet: Regular  diet DVT Prophylaxis: Therapeutic Anticoagulation with Eliquis    Advance goals of care discussion: Full code  Family Communication: family was Not present at bedside, at the time of interview.  The pt provided permission to discuss medical plan with the  family. Opportunity was given to ask question and all questions were answered satisfactorily.   Disposition:  Pt is homeless, admitted with EtOH abuse, pulmonary embolism, A-fib with RVR, still has AMS, which precludes a safe discharge. Discharge to SNF, when stable.  TOC following for placement  Subjective: Patient in bed, appears comfortable, denies any headache, no fever, no chest pain or pressure, no shortness of breath , no abdominal pain. No new focal weakness.    Physical Exam:  Awake but pleasantly confused, No new F.N deficits, Normal affect [place].AT,PERRAL Supple Neck, No JVD,   Symmetrical Chest wall movement, Good air movement bilaterally, CTAB RRR,No Gallops, Rubs or new Murmurs,  +ve B.Sounds, Abd Soft, No tenderness,   No Cyanosis, Clubbing or edema    Vitals:   02/15/23 0407 02/15/23 0431 02/15/23 0753 02/15/23 0833  BP: 96/70   111/71  Pulse:   78   Resp: (!) 22 20    Temp: 97.6 F (36.4 C)     TempSrc: Oral     SpO2: 100%  98%   Weight:      Height:        Intake/Output Summary (Last 24 hours) at 02/15/2023 0914 Last data filed at 02/14/2023 1130 Gross per 24 hour  Intake 240 ml  Output --  Net 240 ml   Filed Weights   02/10/23 0500 02/11/23 0300 02/14/23 0456  Weight: 69.1 kg 62.2 kg 66.7 kg    Data Reviewed:  Recent Labs  Lab 02/09/23 0624 02/10/23 0222 02/13/23 0347 02/14/23 0107 02/15/23 0655  WBC 4.9 4.1 6.2 5.9 4.4  HGB 8.9* 9.2* 8.7* 8.8* 9.4*  HCT 27.1* 28.2* 27.1* 27.5* 29.7*  PLT 253 274 241 243 230  MCV 104.6* 104.8* 102.3* 107.0* 106.1*  MCH 34.4* 34.2* 32.8 34.2* 33.6  MCHC 32.8 32.6 32.1 32.0 31.6  RDW 23.3* 23.9* 24.2* 23.9* 23.5*  LYMPHSABS 1.6 1.2  --   --   --   MONOABS 0.6 0.6  --   --   --   EOSABS 0.1 0.1  --   --   --   BASOSABS 0.0 0.0  --   --   --     Recent Labs  Lab 02/09/23 0624 02/10/23 0222 02/11/23 0609 02/12/23 0111 02/13/23 0347 02/14/23 0107 02/15/23 0655  NA 135 139 139  --  137 138 137  K  4.4 4.0 4.1  --  4.2 4.1 3.9  CL 110 108 107  --  105 104 104  CO2 17* 17* 20*  --  24 24 23   ANIONGAP 8 14 12   --  8 10 10   GLUCOSE 86 82 71  --  91 85 86  BUN 17 18 13   --  7* 11 12  CREATININE 0.99 1.04 0.83  --  0.73 0.79 0.86  AST 40 48* 43*  --   --   --   --   ALT 22 23 23   --   --   --   --   ALKPHOS 146* 136* 116  --   --   --   --   BILITOT 0.7 0.9 1.1  --   --   --   --   ALBUMIN 2.6* 2.7* 2.5*  --   --   --   --  BNP 2,274.2* 1,666.7*  --   --   --   --   --   MG 1.7 1.4* 1.7 1.3* 1.6* 1.3* 1.5*  CALCIUM 8.1* 8.2* 8.0*  --  8.0* 8.5* 8.7*    Recent Labs  Lab 02/09/23 0624 02/10/23 0222 02/11/23 0609 02/12/23 0111 02/13/23 0347 02/14/23 0107 02/15/23 0655  BNP 2,274.2* 1,666.7*  --   --   --   --   --   MG 1.7 1.4* 1.7 1.3* 1.6* 1.3* 1.5*  CALCIUM 8.1* 8.2* 8.0*  --  8.0* 8.5* 8.7*     Studies: No results found.  Scheduled Meds:  (feeding supplement) PROSource Plus  30 mL Oral BID BM   amiodarone  200 mg Oral Daily   apixaban  5 mg Oral BID   bisacodyl  10 mg Oral BID   feeding supplement  237 mL Oral BID BM   ferrous sulfate  325 mg Oral Q breakfast   folic acid  1 mg Oral Daily   guaiFENesin  600 mg Oral BID   lidocaine  3 patch Transdermal Q24H   metoprolol tartrate  25 mg Oral BID   midodrine  10 mg Oral TID WC   mometasone-formoterol  2 puff Inhalation BID   multivitamin with minerals  1 tablet Oral Daily   pantoprazole  40 mg Oral Daily   polyethylene glycol  17 g Oral BID   QUEtiapine  50 mg Oral BID   rosuvastatin  10 mg Oral Daily   thiamine  100 mg Oral Daily   Vitamin D (Ergocalciferol)  50,000 Units Oral Q7 days   Continuous Infusions:   PRN Meds: [COMPLETED] acetaminophen **FOLLOWED BY** acetaminophen, albuterol, bisacodyl **FOLLOWED BY** [START ON 02/16/2023] bisacodyl, bisacodyl, hydrOXYzine, [DISCONTINUED] ondansetron **OR** ondansetron (ZOFRAN) IV, traMADol  Time spent: 35 minutes  Signature  -    Susa Raring M.D on 02/15/2023  at 9:14 AM   -  To page go to [email or web address]

## 2023-02-15 NOTE — Plan of Care (Signed)
  Problem: Education: Goal: Knowledge of General Education information will improve Description: Including pain rating scale, medication(s)/side effects and non-pharmacologic comfort measures Outcome: Progressing   Problem: Clinical Measurements: Goal: Will remain free from infection Outcome: Progressing Goal: Respiratory complications will improve Outcome: Progressing Goal: Cardiovascular complication will be avoided Outcome: Progressing   Problem: Nutrition: Goal: Adequate nutrition will be maintained Outcome: Progressing   Problem: Elimination: Goal: Will not experience complications related to bowel motility Outcome: Progressing Goal: Will not experience complications related to urinary retention Outcome: Progressing

## 2023-02-15 NOTE — TOC Progression Note (Addendum)
Transition of Care [facility]) - Progression Note    Patient Details  Name: Anthony [name] MRN: [id number] Date of Birth: July 30, 1955  [facility]) CM/SW Contact  [name], LCSW Phone Number: 02/15/2023, 2:11 PM  Clinical Narrative:    CSW met with patient and discussed SNF placement. Patient expressed concern that one he had gone to before told him he would have to turn over his check after a certain amount of time in order to stay. CSW made him aware that the hospital would seek approval coverage from his Medicare but that if he chose to stay long term then it would be the same process. Patient stated he would see how it went but that he would also make some calls while in SNF to identify his next steps. He declines needing substance use resources. CSW provided SNF bed offers and patient stated he would prefer to be closer to the hospital. [name] able to [facility] patient tomorrow if insurance approval is received. CSW starting insurance process with CMA, Ref# Auth ID [id number].    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, SNF Pending bed offer  Expected Discharge Plan and Services In-house Referral: Clinical Social Work   Post Acute Care Choice: Skilled Nursing Facility Living arrangements for the past 2 months: Homeless Shelter                                       Social Determinants of Health (SDOH) Interventions SDOH Screenings   Food Insecurity: Food Insecurity Present (12/11/2022)  Housing: High Risk (12/02/2022)  Transportation Needs: Unmet Transportation Needs (12/11/2022)  Utilities: Not At Risk (12/11/2022)  Recent Concern: Utilities - At Risk (12/02/2022)  Depression (PHQ2-9): Medium Risk (10/16/2020)  Tobacco Use: High Risk (02/04/2023)    Readmission Risk Interventions    02/08/2023    5:25 PM 10/12/2022    1:29 PM 08/28/2022    2:00 PM   Readmission Risk Prevention Plan  Transportation Screening Complete Complete Complete  Medication Review Oceanographer) Complete Complete   PCP or Specialist appointment within 3-5 days of discharge Complete Complete Complete  HRI or Home Care Consult Complete Complete   SW Recovery Care/Counseling Consult Complete Complete Complete  Palliative Care Screening Not Applicable Not Applicable Not Applicable  Skilled Nursing Facility Complete Not Applicable Not Applicable

## 2023-02-15 NOTE — Progress Notes (Signed)
Physical Therapy Treatment Patient Details Name: Anthony [name] MRN: [id number] DOB: 09/14/1955 Today's Date: 02/15/2023   History of Present Illness Anthony [name] is a 67 y.o. male who presents with SOB. CT scans +PE, acute L 3rd rib fx, subacute L 5th rib fx, comminuted fracture of the left clavicular head  PMH: alcohol abuse, housing insecurity, PE, depression, HTN, rhabdomyolysis, CVA.    PT Comments  Patient alert. Able to persuade him to cooperate with PT and get OOB and ambulate. Walked 110 ft at VERY slow pace with kyphotic posture but no physical assist for balance. Refused to sit up in the chair on return to his room and assisted back to bed.     If plan is discharge home, recommend the following: A Peregrina help with walking and/or transfers;A Raus help with bathing/dressing/bathroom;Assistance with cooking/housework;Assist for transportation;Help with stairs or ramp for entrance   Can travel by private vehicle        Equipment Recommendations  Rollator (4 wheels)    Recommendations for Other Services       Precautions / Restrictions Precautions Precautions: Fall Precaution Comments: AMS Restrictions Weight Bearing Restrictions: No     Mobility  Bed Mobility Overal bed mobility: Needs Assistance Bed Mobility: Supine to Sit, Sit to Supine     Supine to sit: Supervision Sit to supine: Supervision   General bed mobility comments: pt unrestrained ; able to move himself to sit EOB with feet on the floor    Transfers Overall transfer level: Needs assistance Equipment used: Rolling walker (2 wheels) Transfers: Sit to/from Stand Sit to Stand: Min guard           General transfer comment: pt using one hand on RW and one on the bed; RW did not tip as coming to stand    Ambulation/Gait Ambulation/Gait assistance: Min guard Gait Distance (Feet): 110 Feet Assistive device: Rolling walker (2 wheels) Gait Pattern/deviations: Decreased dorsiflexion - left, Decreased  dorsiflexion - right, Shuffle, Narrow base of support, Step-through pattern Gait velocity: decr Gait velocity interpretation: <1.31 ft/sec, indicative of household ambulator   General Gait Details: Very slow short shuffled steps (~5" step length), Very kyphotic posture which he can partially correct with cues, but quickly reverts back to forward flexed posture   Stairs             Wheelchair Mobility     Tilt Bed    Modified Rankin (Stroke Patients Only)       Balance Overall balance assessment: Needs assistance Sitting-balance support: Feet supported, Bilateral upper extremity supported Sitting balance-Leahy Scale: Fair Sitting balance - Comments: EOB   Standing balance support: No upper extremity supported Standing balance-Leahy Scale: Fair Standing balance comment: released RW ~4x for standing stretch break                            Cognition Arousal/Alertness: Awake/alert Behavior During Therapy: Flat affect Overall Cognitive Status: No family/caregiver present to determine baseline cognitive functioning Area of Impairment: Memory, Following commands, Safety/judgement, Awareness, Attention, Orientation, Problem solving                 Orientation Level: Disoriented to, Time, Situation, Place Current Attention Level: Sustained Memory: Decreased short-term memory Following Commands: Follows one step commands with increased time, Follows one step commands inconsistently Safety/Judgement: Decreased awareness of safety, Decreased awareness of deficits Awareness: Intellectual Problem Solving: Slow processing, Difficulty sequencing, Requires verbal cues, Requires tactile cues General Comments:  in time for all mobility at pt moving very slowly        Exercises      General Comments        Pertinent Vitals/Pain Pain Assessment Pain Assessment: Faces Faces Pain Scale: Hurts even more Pain Location: back Pain Descriptors / Indicators:  Grimacing Pain Intervention(s): Limited activity within patient's tolerance    Home Living                          Prior Function            PT Goals (current goals can now be found in the care plan section) Acute Rehab PT Goals Patient Stated Goal: get stronger and safe housing Time For Goal Achievement: 02/19/23 Potential to Achieve Goals: Good Progress towards PT goals: Progressing toward goals    Frequency    Min 3X/week      PT Plan Current plan remains appropriate    Co-evaluation              AM-PAC PT "6 Clicks" Mobility   Outcome Measure  Help needed turning from your back to your side while in a flat bed without using bedrails?: A Heskett Help needed moving from lying on your back to sitting on the side of a flat bed without using bedrails?: A Mccole Help needed moving to and from a bed to a chair (including a wheelchair)?: A Aburto Help needed standing up from a chair using your arms (e.g., wheelchair or bedside chair)?: A Oneil Help needed to walk in hospital room?: A Borello Help needed climbing 3-5 steps with a railing? : A Lot 6 Click Score: 17    End of Session Equipment Utilized During Treatment: Gait belt Activity Tolerance: Patient tolerated treatment well Patient left: in bed;with call bell/phone within reach;with bed alarm set (bil wrist restraints)   PT Visit Diagnosis: Other abnormalities of gait and mobility (R26.89);Muscle weakness (generalized) (M62.81);Difficulty in walking, not elsewhere classified (R26.2)     Time: 1132-1208 PT Time Calculation (min) (ACUTE ONLY): 36 min  Charges:    $Gait Training: 23-37 mins PT General Charges $$ ACUTE PT VISIT: 1 Visit                       Jerolyn Center, PT Acute Rehabilitation Services  Office [phone]    Zena Amos 02/15/2023, 12:20 PM

## 2023-02-16 DIAGNOSIS — I2699 Other pulmonary embolism without acute cor pulmonale: Secondary | ICD-10-CM | POA: Diagnosis not present

## 2023-02-16 MED ORDER — FOLIC ACID 1 MG PO TABS: 1.0000 mg | ORAL_TABLET | Freq: Every day | ORAL | Status: DC

## 2023-02-16 MED ORDER — APIXABAN 5 MG PO TABS: 5.0000 mg | ORAL_TABLET | Freq: Two times a day (BID) | ORAL | Status: DC

## 2023-02-16 MED ORDER — QUETIAPINE FUMARATE 50 MG PO TABS: 50.0000 mg | ORAL_TABLET | Freq: Two times a day (BID) | ORAL | Status: DC

## 2023-02-16 MED ORDER — METOPROLOL TARTRATE 25 MG PO TABS: 25.0000 mg | ORAL_TABLET | Freq: Two times a day (BID) | ORAL | Status: DC

## 2023-02-16 MED ORDER — VITAMIN B-1 100 MG PO TABS: 100.0000 mg | ORAL_TABLET | Freq: Every day | ORAL | Status: DC

## 2023-02-16 MED ORDER — MAGNESIUM OXIDE -MG SUPPLEMENT 400 (240 MG) MG PO TABS: 400.0000 mg | ORAL_TABLET | Freq: Two times a day (BID) | ORAL | Status: DC

## 2023-02-16 MED ORDER — PANTOPRAZOLE SODIUM 40 MG PO TBEC: 40.0000 mg | DELAYED_RELEASE_TABLET | Freq: Every day | ORAL | Status: DC

## 2023-02-16 MED ORDER — ROSUVASTATIN CALCIUM 10 MG PO TABS: 10.0000 mg | ORAL_TABLET | Freq: Every day | ORAL | Status: DC

## 2023-02-16 MED ORDER — MAGNESIUM SULFATE 2 GM/50ML IV SOLN
2.0000 g | Freq: Once | INTRAVENOUS | Status: AC
  Administered 2023-02-16: 2 g via INTRAVENOUS
  Filled 2023-02-16: qty 50

## 2023-02-16 MED ORDER — MOMETASONE FURO-FORMOTEROL FUM 200-5 MCG/ACT IN AERO: 2.0000 | INHALATION_SPRAY | Freq: Two times a day (BID) | RESPIRATORY_TRACT | Status: DC

## 2023-02-16 MED ORDER — MIDODRINE HCL 10 MG PO TABS: 10.0000 mg | ORAL_TABLET | Freq: Three times a day (TID) | ORAL | Status: DC

## 2023-02-16 NOTE — Progress Notes (Signed)
1324: attempted to call report. Secretary transferred but call was never answered

## 2023-02-16 NOTE — Progress Notes (Signed)
Heart Failure Navigator Progress Note  Assessed for Heart & Vascular TOC clinic readiness.  Patient does not meet criteria due to EF 40-45%, patient with plans to discharge to SNF with PCP follow up in 1-2 weeks per MD .   Navigator will sign off at this time.   Rhae Hammock, BSN, Scientist, clinical (histocompatibility and immunogenetics) Only

## 2023-02-16 NOTE — Discharge Summary (Signed)
Anthony [name] ZOX:[id number] DOB: [birth year] DOA: 02/03/2023  PCP: Pcp, No  Admit date: 02/03/2023  Discharge date: 02/16/2023  Admitted From: Home   Disposition:  SNF   Recommendations for Outpatient Follow-up:   Follow up with PCP in 1-2 weeks  PCP Please obtain BMP/CBC, 2 view CXR in 1week,  (see Discharge instructions)   PCP Please follow up on the following pending results:     Home Health: None   Equipment/Devices: None  Consultations: Cards Discharge Condition: Stable     CODE STATUS: Full     Diet Recommendation: Heart Healthy - 1.2 Lit/ day fluid restriction    Chief Complaint  Patient presents with   Shortness of Breath   Hypoglycemia     Brief history of present illness from the day of admission and additional interim summary     67 y.o. male with medical history significant of alcohol abuse, anxiety, HTN, homelessness, CVA who presented with chest pain and shortness of breath.  He was found to have a pulmonary embolism as well as atrial fibrillation with RVR and soft blood pressures, hyponatremic with edema, cardiology was consulted and he was admitted to the hospital.  Transferred to my care on 02/06/2023                                                                  Hospital Course   Acute Pulmonary Embolism with Infarct:  CT PE protocol with acute segmental and subsegmental PE in posterior RLL with associated developing pulmonary infarct in the lateral basal segment, negative lower extremity venous duplex.  Echo noted with reduction in RV systolic function.  Now transitioned from heparin drip to Eliquis.   Acute Fracture of L Lateral 3rd Rib with Associated Pulmonary Contusion:  Unchanged Comminuted Fracture of the L Clavicular Head and Subacute Fracture of the L Lateral 5th Rib, supportive  care, PT OT, I-S for pulmonary toiletry.  Encouraged to sit in chair in daytime.  Monitor.   Atrial Fibrillation with RVR: Advised to score of at least 4.  Echo noted, discussed with cardiology on 02/06/2023 Dr. Jens Som, TSH stable currently on amiodarone which will be continued, Eliquis.  Acute on chronic systolic left-sided heart failure along with RV systolic dysfunction as well.  Hold further fluids, diurese as tolerated by blood pressure and monitor.  Blood pressure too low for ACE/ARB/Entresto, low-dose beta-blocker if tolerated.  Seen by cardiology this admission nothing else to offer.   New right-sided pleural effusion.  Seen by pulmonary, repeat CT checked on 02/06/2023, does not look like hemothorax, underwent bedside ultrasound-guided thoracentesis on 02/06/2023 fluid appears to be transudative, outpatient pulmonary follow-up postdischarge - SNF to arrange.      Hyponatremia  - Has bilateral LE edema on exam, further IV fluids and diurese with Lasix  and monitor   Non anion gap metabolic acidosis - Likely contraction alkalosis from intravascular depletion, third spacing of fluids and to some extent lactic acidosis.  Has been adequately hydrated.  Add protein supplements, diurese, this problem has resolved.   Etoh Abuse  Drinks 6 pack daily consult to quit, was on Librium tapered & CIWA protocol.   Metabolic encephalopathy.  Getting worse at nighttime likely due to hospital delirium, ripping IVs, has had 10 IVs placed so far, placed on Seroquel twice daily, QTc on telemetry on 02/08/2023 is 420 ms.  Encephalopathy has improved.   Dyslipidemia  Crestor   Hypomagnesemia   Replaced, Continue to monitor   Chronic hypotension   blood pressure gradually improving, he is chronically on midodrine which will be continued   Hypoglycemia  Due to poor oral intake, stable random TSH and a.m. cortisol, monitor CBGs.   Metabolic acidosis, started oral bicarbonate x few doses, resolved.   Iron  deficiency, transferrin saturation 6%, continue oral iron supplement.  Follow with PCP to repeat iron profile after 3 to 6 months. B12 and folic acid within normal range, replaced.      Discharge diagnosis     Principal Problem:   Pulmonary embolism (HCC) Active Problems:   Alcohol abuse   HTN (hypertension)   Paroxysmal atrial fibrillation (HCC)   Hypomagnesemia   Atrial fibrillation with RVR (HCC)   Dyslipidemia   Dehydration   Hyponatremia   Rib fractures   Hypoglycemia   Pulmonary embolus [facility])    Discharge instructions    Discharge Instructions     Discharge instructions   Complete by: As directed    Follow with Primary MD Pcp, No in 7 days   Get CBC, CMP, 2 view Chest X ray -  checked next visit with your primary MD or SNF MD   Activity: As tolerated with Full fall precautions use walker/cane & assistance as needed  Disposition SNF  Diet: Heart Healthy  Check your Weight same time everyday, if you gain over 2 pounds, or you develop in leg swelling, experience more shortness of breath or chest pain, call your Primary MD immediately. Follow Cardiac Low Salt Diet and 1.5 lit/day fluid restriction.  Special Instructions: If you have smoked or chewed Tobacco  in the last 2 yrs please stop smoking, stop any regular Alcohol  and or any Recreational drug use.  On your next visit with your primary care physician please Get Medicines reviewed and adjusted.  Please request your Prim.MD to go over all Hospital Tests and Procedure/Radiological results at the follow up, please get all Hospital records sent to your Prim MD by signing hospital release before you go home.  If you experience worsening of your admission symptoms, develop shortness of breath, life threatening emergency, suicidal or homicidal thoughts you must seek medical attention immediately by calling 911 or calling your MD immediately  if symptoms less severe.  You Must read complete instructions/literature  along with all the possible adverse reactions/side effects for all the Medicines you take and that have been prescribed to you. Take any new Medicines after you have completely understood and accpet all the possible adverse reactions/side effects.   Increase activity slowly   Complete by: As directed        Discharge Medications   Allergies as of 02/16/2023       Reactions   Other Itching, Other (See Comments)   Seasonal allergies- Itchy eyes, runny nose, congestion   Poison Ivy Extract Rash  Medication List     STOP taking these medications    Centrum Silver 50+Men Tabs   diltiazem 120 MG 24 hr capsule Commonly known as: CARDIZEM CD   Ensure High Protein Liqd   loratadine 10 MG tablet Commonly known as: CLARITIN   methocarbamol 500 MG tablet Commonly known as: ROBAXIN   metoprolol succinate 25 MG 24 hr tablet Commonly known as: TOPROL-XL   mirtazapine 15 MG tablet Commonly known as: REMERON   Xarelto 20 MG Tabs tablet Generic drug: rivaroxaban       TAKE these medications    acetaminophen 500 MG tablet Commonly known as: TYLENOL Take 1 tablet (500 mg total) by mouth every 8 (eight) hours as needed. What changed:  how much to take when to take this reasons to take this   amiodarone 200 MG tablet Commonly known as: PACERONE Take 1 tablet (200 mg total) by mouth daily. What changed: Another medication with the same name was removed. Continue taking this medication, and follow the directions you see here.   apixaban 5 MG Tabs tablet Commonly known as: ELIQUIS Take 1 tablet (5 mg total) by mouth 2 (two) times daily.   FeroSul 325 (65 Fe) MG tablet Generic drug: ferrous sulfate Take 1 tablet (325 mg total) by mouth daily with breakfast.   folic acid 1 MG tablet Commonly known as: FOLVITE Take 1 tablet (1 mg total) by mouth daily.   magnesium oxide 400 (240 Mg) MG tablet Commonly known as: MAG-OX Take 1 tablet (400 mg total) by mouth 2  (two) times daily.   metoprolol tartrate 25 MG tablet Commonly known as: LOPRESSOR Take 1 tablet (25 mg total) by mouth 2 (two) times daily.   midodrine 10 MG tablet Commonly known as: PROAMATINE Take 1 tablet (10 mg total) by mouth 3 (three) times daily with meals.   mometasone-formoterol 200-5 MCG/ACT Aero Commonly known as: DULERA Inhale 2 puffs into the lungs 2 (two) times daily.   pantoprazole 40 MG tablet Commonly known as: PROTONIX Take 1 tablet (40 mg total) by mouth daily. Start taking on: February 17, 2023   QUEtiapine 50 MG tablet Commonly known as: SEROQUEL Take 1 tablet (50 mg total) by mouth 2 (two) times daily.   rosuvastatin 10 MG tablet Commonly known as: CRESTOR Take 1 tablet (10 mg total) by mouth daily.   thiamine 100 MG tablet Commonly known as: Vitamin B-1 Take 1 tablet (100 mg total) by mouth daily. Start taking on: February 17, 2023         Contact information for after-discharge care     Destination     [facility] Preferred SNF .   Service: Skilled Nursing Contact information: [address] [place] [address] [phone]                     Major procedures and Radiology Reports - PLEASE review detailed and final reports thoroughly  -      DG Chest [facility] 1 View  Result Date: 02/07/2023 CLINICAL DATA:  Shortness of breath EXAM: PORTABLE CHEST 1 VIEW COMPARISON:  Chest radiograph dated 02/06/2023. FINDINGS: The heart is enlarged. Vascular calcifications are seen in the aortic arch. There is a small to moderate right pleural effusion with associated atelectasis/airspace disease, slightly increased compared to prior exam. A left pleural effusion is difficult to exclude. There is left basilar atelectasis/airspace disease. IMPRESSION: Small to moderate right pleural effusion with associated atelectasis/airspace disease, slightly increased compared to prior  exam. Electronically Signed   By: Romona Curls M.D.    On: 02/07/2023 15:34   DG Chest Port 1 View  Result Date: 02/06/2023 CLINICAL DATA:  Post thoracentesis. EXAM: PORTABLE CHEST 1 VIEW COMPARISON:  CT and radiographs 02/06/2023. FINDINGS: 1135 hours. The heart size and mediastinal contours are stable with mild aortic atherosclerosis. Right pleural effusion has decreased in volume following presumed right-sided thoracentesis. No evidence of pneumothorax. A small left pleural effusion appears unchanged. There is interval improved aeration of the right lung. Patchy left basilar pulmonary opacity appears stable. No acute osseous findings are seen; known left-sided rib fractures are not well visualized. Telemetry leads overlie the chest. IMPRESSION: 1. Decreased right pleural effusion following thoracentesis. No evidence of pneumothorax. 2. Stable small left pleural effusion and patchy left basilar pulmonary opacity. Electronically Signed   By: Carey Bullocks M.D.   On: 02/06/2023 12:02   CT CHEST WO CONTRAST  Result Date: 02/06/2023 CLINICAL DATA:  Pneumonia, complication suspected, xray done EXAM: CT CHEST WITHOUT CONTRAST TECHNIQUE: Multidetector CT imaging of the chest was performed following the standard protocol without IV contrast. RADIATION DOSE REDUCTION: This exam was performed according to the departmental dose-optimization program which includes automated exposure control, adjustment of the mA and/or kV according to patient size and/or use of iterative reconstruction technique. COMPARISON:  CTA 02/03/2023 FINDINGS: Cardiovascular: Coronary and scattered aortic calcifications. Mild four-chamber cardiac enlargement. No pericardial effusion. Mediastinum/Nodes: No mediastinal hematoma or mass. 1.2 cm subcarinal node, previously 1.4 on 01/06/2023. 1.4 cm right paratracheal node, previously 1.3. Additional scattered subcentimeter prominent pre-vascular, and AP window lymph nodes stable. No definite hilar adenopathy. Postop changes at the GE junction with  regional streaky changes in the posterior mediastinal fat, stable since 01/06/2023. Lungs/Pleura: New moderate right pleural effusion. New small left pleural effusion. No pneumothorax. Fairly dense consolidation throughout most of the right lower lobe, and in the posterior dependent aspect of the left lower lobe. Subpleural ground-glass opacities in the anterolateral left upper lobe have slightly increased since previous exam Upper Abdomen: Surgical clips left upper quadrant. Small volume perisplenic fluid, new since prior study. Layering hyperdensity in the gallbladder possibly vicarious excretion of previously administered IV contrast material. Musculoskeletal: Minimally displaced fracture deformities of left ribs 3 and 5 as before. Comminuted left clavicle fracture as before. Bilateral acromioclavicular and glenohumeral degenerative change. IMPRESSION: 1. New moderate right and small left pleural effusions with associated lower lobe consolidation, right greater than left. 2. Left rib fractures with interval increase in subpleural contusion in the anterolateral left upper lobe. 3. Stable mediastinal adenopathy. 4. New small volume perisplenic fluid. 5. Coronary and aortic Atherosclerosis (ICD10-I70.0). Electronically Signed   By: Corlis Leak M.D.   On: 02/06/2023 09:24   DG Chest Port 1 View  Result Date: 02/06/2023 CLINICAL DATA:  Shortness of breath EXAM: PORTABLE CHEST 1 VIEW COMPARISON:  Abdominal CT from 3 days ago FINDINGS: Sizable right pleural effusion with leftward displacement of the mediastinum. Underlying pulmonary opacity in the setting of pulmonary embolism. Cardiomegaly. No pneumothorax. Chronic fracture at the lateral right clavicle. Prelim sent in epic chat. IMPRESSION: Large right pleural effusion since chest CT 3 days ago. Consider updated CT. Electronically Signed   By: Tiburcio Pea M.D.   On: 02/06/2023 06:58   VAS [place] LOWER EXTREMITY VENOUS (DVT)  Result Date: 02/04/2023  Lower  Venous DVT Study Patient Name:  Anthony [name]  Date of Exam:   02/04/2023 Medical Rec #: [id number]     Accession #:  [id number] Date of Birth: 08/23/1955     Patient Gender: M Patient Age:   71 years Exam Location:  [facility] Procedure:      VAS [place] LOWER EXTREMITY VENOUS (DVT) Referring Phys: Jonny Ruiz DOUTOVA --------------------------------------------------------------------------------  Indications: Pulmonary embolism, and Swelling.  Comparison Study: Prior negative bilateral LEV done 11/04/16, prior negative left                   LEV done 05/19/22 Performing Technologist: Sherren Kerns RVS  Examination Guidelines: A complete evaluation includes B-mode imaging, spectral Doppler, color Doppler, and power Doppler as needed of all accessible portions of each vessel. Bilateral testing is considered an integral part of a complete examination. Limited examinations for reoccurring indications may be performed as noted. The reflux portion of the exam is performed with the patient in reverse Trendelenburg.  +--------+---------------+---------+-----------+----------------+-------------+ RIGHT   CompressibilityPhasicitySpontaneityProperties      Thrombus                                                                 Aging         +--------+---------------+---------+-----------+----------------+-------------+ CFV     Full                               pulsatile                                                                waveforms                     +--------+---------------+---------+-----------+----------------+-------------+ SFJ     Full                                                             +--------+---------------+---------+-----------+----------------+-------------+ FV Prox Full                                                             +--------+---------------+---------+-----------+----------------+-------------+ FV Mid  Full                                                              +--------+---------------+---------+-----------+----------------+-------------+ FV      Full  Distal                                                                   +--------+---------------+---------+-----------+----------------+-------------+ PFV     Full                                                             +--------+---------------+---------+-----------+----------------+-------------+ POP     Full                               pulsatile                                                                waveforms                     +--------+---------------+---------+-----------+----------------+-------------+ PTV     Full                                                             +--------+---------------+---------+-----------+----------------+-------------+ PERO    Full                                                             +--------+---------------+---------+-----------+----------------+-------------+   +---------+---------------+---------+-----------+---------------+-------------+ LEFT     CompressibilityPhasicitySpontaneityProperties     Thrombus                                                                 Aging         +---------+---------------+---------+-----------+---------------+-------------+ CFV      Full                               pulsatile                                                                waveforms                    +---------+---------------+---------+-----------+---------------+-------------+  SFJ      Full                                                            +---------+---------------+---------+-----------+---------------+-------------+ FV Prox  Full                                                             +---------+---------------+---------+-----------+---------------+-------------+ FV Mid   Full                                                            +---------+---------------+---------+-----------+---------------+-------------+ FV DistalFull                                                            +---------+---------------+---------+-----------+---------------+-------------+ PFV      Full                                                            +---------+---------------+---------+-----------+---------------+-------------+ POP      Full                               pulsatile                                                                waveforms                    +---------+---------------+---------+-----------+---------------+-------------+ PTV      Full                                                            +---------+---------------+---------+-----------+---------------+-------------+ PERO     Full                                                            +---------+---------------+---------+-----------+---------------+-------------+ Soleal   Full                                                            +---------+---------------+---------+-----------+---------------+-------------+  Summary: BILATERAL: - No evidence of deep vein thrombosis seen in the lower extremities, bilaterally. -No evidence of popliteal cyst, bilaterally.   *See table(s) above for measurements and observations. Electronically signed by Heath Lark on 02/04/2023 at 2:15:34 PM.    Final    ECHOCARDIOGRAM COMPLETE  Result Date: 02/04/2023    ECHOCARDIOGRAM REPORT   Patient Name:   Brentton A Riordan Date of Exam: 02/04/2023 Medical Rec #:  [id number]    Height:       69.0 in Accession #:    [id number]   Weight:       150.0 lb Date of Birth:  01/16/1956    BSA:          1.828 m Patient Age:    67 years     BP:           115/85 mmHg Patient Gender: M            HR:            105 bpm. Exam Location:  Inpatient Procedure: 2D Echo, Cardiac Doppler and Color Doppler Indications:    pulmonary embolus  History:        Patient has prior history of Echocardiogram examinations, most                 recent 08/28/2022. Arrythmias:Atrial Fibrillation; Risk                 Factors:Hypertension, Dyslipidemia and Alcohol abuse.  Sonographer:    Delcie Roch RDCS Referring Phys: [id number] ANASTASSIA DOUTOVA IMPRESSIONS  1. Left ventricular ejection fraction, by estimation, is 40 to 45%. The left ventricle has mildly decreased function. The left ventricle demonstrates regional wall motion abnormalities (see scoring diagram/findings for description). Left ventricular diastolic function could not be evaluated. There is global LV hypokinesis with akinesis of the basilar to mid inferior wall.  2. Right ventricular systolic function is severely reduced. The right ventricular size is moderately enlarged. Tricuspid regurgitation signal is inadequate for assessing PA pressure.  3. Left atrial size was severely dilated.  4. Right atrial size was severely dilated.  5. The mitral valve is thickened and the posterior leaflet is restricted there is moderate to severe central MR. The mitral valve is abnormal. Moderate to severe mitral valve regurgitation. No evidence of mitral stenosis.  6. The aortic valve is tricuspid. There is mild calcification of the aortic valve. Aortic valve regurgitation is not visualized. Aortic valve sclerosis/calcification is present, without any evidence of aortic stenosis.  7. The inferior vena cava is dilated in size with <50% respiratory variability, suggesting right atrial pressure of 15 mmHg. FINDINGS  Left Ventricle: Left ventricular ejection fraction, by estimation, is 40 to 45%. The left ventricle has mildly decreased function. The left ventricle demonstrates regional wall motion abnormalities. The left ventricular internal cavity size was normal in size. There is no left  ventricular hypertrophy. Left ventricular diastolic function could not be evaluated due to atrial fibrillation. Left ventricular diastolic function could not be evaluated.  LV Wall Scoring: The inferior wall is akinetic. Right Ventricle: The right ventricular size is moderately enlarged. No increase in right ventricular wall thickness. Right ventricular systolic function is severely reduced. Tricuspid regurgitation signal is inadequate for assessing PA pressure. The tricuspid regurgitant velocity is 2.13 m/s, and with an assumed right atrial pressure of 8 mmHg, the estimated right ventricular systolic pressure is 26.1 mmHg. Left Atrium: Left atrial size was severely dilated. Right Atrium: Right atrial size was severely  dilated. Pericardium: There is no evidence of pericardial effusion. Mitral Valve: The mitral valve is thickened and the posterior leaflet is restricted there is moderate to severe central MR. The mitral valve is abnormal. There is moderate thickening of the mitral valve leaflet(s). There is moderate calcification of the mitral valve leaflet(s). Moderate to severe mitral valve regurgitation. No evidence of mitral valve stenosis. Tricuspid Valve: The tricuspid valve is normal in structure. Tricuspid valve regurgitation is not demonstrated. No evidence of tricuspid stenosis. Aortic Valve: The aortic valve is tricuspid. There is mild calcification of the aortic valve. Aortic valve regurgitation is not visualized. Aortic valve sclerosis/calcification is present, without any evidence of aortic stenosis. Pulmonic Valve: The pulmonic valve was normal in structure. Pulmonic valve regurgitation is not visualized. No evidence of pulmonic stenosis. Aorta: The aortic root is normal in size and structure. Venous: The inferior vena cava is dilated in size with less than 50% respiratory variability, suggesting right atrial pressure of 15 mmHg. IAS/Shunts: No atrial level shunt detected by color flow Doppler.  LEFT  VENTRICLE PLAX 2D LVIDd:         4.10 cm LVIDs:         3.80 cm LV PW:         0.80 cm LV IVS:        0.70 cm LVOT diam:     2.10 cm LVOT Area:     3.46 cm  RIGHT VENTRICLE          IVC RV Basal diam:  3.40 cm  IVC diam: 2.30 cm TAPSE (M-mode): 1.4 cm LEFT ATRIUM              Index        RIGHT ATRIUM           Index LA diam:        4.40 cm  2.41 cm/m   RA Area:     20.40 cm LA Vol (A2C):   100.0 ml 54.70 ml/m  RA Volume:   58.50 ml  32.00 ml/m LA Vol (A4C):   76.6 ml  41.90 ml/m LA Biplane Vol: 88.6 ml  48.46 ml/m   AORTA Ao Root diam: 3.30 cm Ao Asc diam:  3.30 cm TRICUSPID VALVE TR Peak grad:   18.1 mmHg TR Vmax:        213.00 cm/s  SHUNTS Systemic Diam: 2.10 cm Arvilla Meres MD Electronically signed by Arvilla Meres MD Signature Date/Time: 02/04/2023/10:27:44 AM    Final    CT Angio Chest PE W and/or Wo Contrast  Result Date: 02/03/2023 CLINICAL DATA:  Pulmonary embolism (PE) suspected, high prob; Abdominal pain, acute, nonlocalized. EXAM: CT ANGIOGRAPHY CHEST CT ABDOMEN AND PELVIS WITH CONTRAST TECHNIQUE: Multidetector CT imaging of the chest was performed using the standard protocol during bolus administration of intravenous contrast. Multiplanar CT image reconstructions and MIPs were obtained to evaluate the vascular anatomy. Multidetector CT imaging of the abdomen and pelvis was performed using the standard protocol during bolus administration of intravenous contrast. RADIATION DOSE REDUCTION: This exam was performed according to the departmental dose-optimization program which includes automated exposure control, adjustment of the mA and/or kV according to patient size and/or use of iterative reconstruction technique. CONTRAST:  75mL OMNIPAQUE IOHEXOL 350 MG/ML SOLN COMPARISON:  Chest CT 01/06/2023.  CT abdomen/pelvis 05/20/2022. FINDINGS: CTA CHEST FINDINGS Cardiovascular: Acute segmental and subsegmental pulmonary emboli in the posterior right lower lobe (for example, coronal image 46  series 8) with associated developing pulmonary infarct in the  lateral basal segment (axial image 219 series 6). No evidence of acute right heart strain. Extensive coronary artery calcifications. Mediastinum/Nodes: Postoperative changes of prior hiatal hernia repair. No thoracic lymphadenopathy. Lungs/Pleura: Subpleural ground-glass opacity in the left upper lobe subjacent to an acute fracture of the left lateral third rib (axial image 39 series 7), consistent with pulmonary contusion. No pneumothorax. Mucous plugging and developing atelectasis in the medial basal segment of the right lower lobe. Musculoskeletal: Unchanged comminuted fracture of the left clavicular head and subacute fracture of the left lateral fifth rib. Review of the MIP images confirms the above findings. CT ABDOMEN and PELVIS FINDINGS Hepatobiliary: No focal liver abnormality is seen. No gallstones, gallbladder wall thickening, or biliary dilatation. Pancreas: Unremarkable. No pancreatic ductal dilatation or surrounding inflammatory changes. Spleen: Normal. Adrenals/Urinary Tract: Adrenal glands are unremarkable. Kidneys are normal, without renal calculi, focal lesion, or hydronephrosis. Bladder is unremarkable. Stomach/Bowel: Normal stomach and duodenum. No dilated loops of small bowel. Appendix is not visualized. Colonic diverticulosis without evidence of acute diverticulitis. Vascular/Lymphatic: Aortic atherosclerosis. No enlarged abdominal or pelvic lymph nodes. Reproductive: Prostate is unremarkable. Other: Diffuse body wall edema. Musculoskeletal: Unchanged mild anterior compression deformities of the L2 through L5 vertebral bodies. Review of the MIP images confirms the above findings. IMPRESSION: 1. Acute segmental and subsegmental pulmonary emboli in the posterior right lower lobe with associated developing pulmonary infarct in the lateral basal segment. No evidence of acute right heart strain. 2. Acute fracture of the left lateral third  rib with associated pulmonary contusion in the left upper lobe. No pneumothorax. 3. Unchanged comminuted fracture of the left clavicular head and subacute fracture of the left lateral fifth rib. 4. No acute findings in the abdomen or pelvis. 5. Colonic diverticulosis without evidence of acute diverticulitis. Aortic Atherosclerosis (ICD10-I70.0). Electronically Signed   By: Orvan Falconer M.D.   On: 02/03/2023 18:31   CT ABDOMEN PELVIS W CONTRAST  Result Date: 02/03/2023 CLINICAL DATA:  Pulmonary embolism (PE) suspected, high prob; Abdominal pain, acute, nonlocalized. EXAM: CT ANGIOGRAPHY CHEST CT ABDOMEN AND PELVIS WITH CONTRAST TECHNIQUE: Multidetector CT imaging of the chest was performed using the standard protocol during bolus administration of intravenous contrast. Multiplanar CT image reconstructions and MIPs were obtained to evaluate the vascular anatomy. Multidetector CT imaging of the abdomen and pelvis was performed using the standard protocol during bolus administration of intravenous contrast. RADIATION DOSE REDUCTION: This exam was performed according to the departmental dose-optimization program which includes automated exposure control, adjustment of the mA and/or kV according to patient size and/or use of iterative reconstruction technique. CONTRAST:  75mL OMNIPAQUE IOHEXOL 350 MG/ML SOLN COMPARISON:  Chest CT 01/06/2023.  CT abdomen/pelvis 05/20/2022. FINDINGS: CTA CHEST FINDINGS Cardiovascular: Acute segmental and subsegmental pulmonary emboli in the posterior right lower lobe (for example, coronal image 46 series 8) with associated developing pulmonary infarct in the lateral basal segment (axial image 219 series 6). No evidence of acute right heart strain. Extensive coronary artery calcifications. Mediastinum/Nodes: Postoperative changes of prior hiatal hernia repair. No thoracic lymphadenopathy. Lungs/Pleura: Subpleural ground-glass opacity in the left upper lobe subjacent to an acute  fracture of the left lateral third rib (axial image 39 series 7), consistent with pulmonary contusion. No pneumothorax. Mucous plugging and developing atelectasis in the medial basal segment of the right lower lobe. Musculoskeletal: Unchanged comminuted fracture of the left clavicular head and subacute fracture of the left lateral fifth rib. Review of the MIP images confirms the above findings. CT ABDOMEN and PELVIS FINDINGS Hepatobiliary: No  focal liver abnormality is seen. No gallstones, gallbladder wall thickening, or biliary dilatation. Pancreas: Unremarkable. No pancreatic ductal dilatation or surrounding inflammatory changes. Spleen: Normal. Adrenals/Urinary Tract: Adrenal glands are unremarkable. Kidneys are normal, without renal calculi, focal lesion, or hydronephrosis. Bladder is unremarkable. Stomach/Bowel: Normal stomach and duodenum. No dilated loops of small bowel. Appendix is not visualized. Colonic diverticulosis without evidence of acute diverticulitis. Vascular/Lymphatic: Aortic atherosclerosis. No enlarged abdominal or pelvic lymph nodes. Reproductive: Prostate is unremarkable. Other: Diffuse body wall edema. Musculoskeletal: Unchanged mild anterior compression deformities of the L2 through L5 vertebral bodies. Review of the MIP images confirms the above findings. IMPRESSION: 1. Acute segmental and subsegmental pulmonary emboli in the posterior right lower lobe with associated developing pulmonary infarct in the lateral basal segment. No evidence of acute right heart strain. 2. Acute fracture of the left lateral third rib with associated pulmonary contusion in the left upper lobe. No pneumothorax. 3. Unchanged comminuted fracture of the left clavicular head and subacute fracture of the left lateral fifth rib. 4. No acute findings in the abdomen or pelvis. 5. Colonic diverticulosis without evidence of acute diverticulitis. Aortic Atherosclerosis (ICD10-I70.0). Electronically Signed   By: Orvan Falconer M.D.   On: 02/03/2023 18:31   CT Head Wo Contrast  Result Date: 02/03/2023 CLINICAL DATA:  Mental status change EXAM: CT HEAD WITHOUT CONTRAST TECHNIQUE: Contiguous axial images were obtained from the base of the skull through the vertex without intravenous contrast. RADIATION DOSE REDUCTION: This exam was performed according to the departmental dose-optimization program which includes automated exposure control, adjustment of the mA and/or kV according to patient size and/or use of iterative reconstruction technique. COMPARISON:  01/06/2023 FINDINGS: Brain: No evidence of acute infarction, hemorrhage, mass, mass effect, or midline shift. No hydrocephalus or extra-axial fluid collection. Vascular: No hyperdense vessel. Skull: Negative for fracture or focal lesion. Sinuses/Orbits: No acute finding. Other: The mastoid air cells are well aerated. IMPRESSION: No acute intracranial process. Electronically Signed   By: Wiliam Ke M.D.   On: 02/03/2023 18:16   DG Chest Port 1 View  Result Date: 02/03/2023 CLINICAL DATA:  Shortness of breath and chest pain.  Lethargy. EXAM: PORTABLE CHEST 1 VIEW COMPARISON:  12/02/2022 FINDINGS: Cardiomegaly. Tortuous aorta. Pulmonary venous hypertension, possibly with early interstitial edema. No infiltrate or collapse. No acute bone finding. IMPRESSION: Cardiomegaly. Pulmonary venous hypertension, possibly with early interstitial edema. Electronically Signed   By: Paulina Fusi M.D.   On: 02/03/2023 14:35    Micro Results    Recent Results (from the past 240 hour(s))  Culture, body fluid w Gram Stain-bottle     Status: None   Collection Time: 02/06/23 12:00 PM   Specimen: Pleura  Result Value Ref Range Status   Specimen Description PLEURAL  Final   Special Requests NONE  Final   Culture   Final    NO GROWTH 5 DAYS Performed at [facility], [address], [place], [place] [address]    Report Status 02/11/2023 FINAL  Final  Gram stain     Status:  None   Collection Time: 02/06/23 12:00 PM   Specimen: Pleura  Result Value Ref Range Status   Specimen Description PLEURAL  Final   Special Requests NONE  Final   Gram Stain   Final    FEW WBC SEEN NO ORGANISMS SEEN Performed at [facility], [address], [place], [place] [address]    Report Status 02/06/2023 FINAL  Final    Today  Subjective    Schyler Faxon today has no headache,no chest abdominal pain,no new weakness tingling or numbness, feels much better wants to go home today.    Objective   Blood pressure 106/82, pulse 73, temperature 98 F (36.7 C), temperature source Oral, resp. rate 14, height 5\' 9"  (1.753 m), weight 66.7 kg, SpO2 98%.   Intake/Output Summary (Last 24 hours) at 02/16/2023 1149 Last data filed at 02/15/2023 2000 Gross per 24 hour  Intake 240 ml  Output 200 ml  Net 40 ml    Exam  Awake Alert, O x 2,  No new F.N deficits,    [place].AT,PERRAL Supple Neck,   Symmetrical Chest wall movement, Good air movement bilaterally, CTAB RRR,No Gallops,   +ve B.Sounds, Abd Soft, Non tender,  No Cyanosis, Clubbing or edema    Data Review   Recent Labs  Lab 02/10/23 0222 02/13/23 0347 02/14/23 0107 02/15/23 0655  WBC 4.1 6.2 5.9 4.4  HGB 9.2* 8.7* 8.8* 9.4*  HCT 28.2* 27.1* 27.5* 29.7*  PLT 274 241 243 230  MCV 104.8* 102.3* 107.0* 106.1*  MCH 34.2* 32.8 34.2* 33.6  MCHC 32.6 32.1 32.0 31.6  RDW 23.9* 24.2* 23.9* 23.5*  LYMPHSABS 1.2  --   --   --   MONOABS 0.6  --   --   --   EOSABS 0.1  --   --   --   BASOSABS 0.0  --   --   --     Recent Labs  Lab 02/10/23 0222 02/11/23 0609 02/12/23 0111 02/13/23 0347 02/14/23 0107 02/15/23 0655 02/16/23 0320  NA 139 139  --  137 138 137  --   K 4.0 4.1  --  4.2 4.1 3.9  --   CL 108 107  --  105 104 104  --   CO2 17* 20*  --  24 24 23   --   ANIONGAP 14 12  --  8 10 10   --   GLUCOSE 82 71  --  91 85 86  --   BUN 18 13  --  7* 11 12  --   CREATININE 1.04 0.83  --  0.73 0.79 0.86  --   AST  48* 43*  --   --   --   --   --   ALT 23 23  --   --   --   --   --   ALKPHOS 136* 116  --   --   --   --   --   BILITOT 0.9 1.1  --   --   --   --   --   ALBUMIN 2.7* 2.5*  --   --   --   --   --   BNP 1,666.7*  --   --   --   --   --   --   MG 1.4* 1.7 1.3* 1.6* 1.3* 1.5* 1.7  CALCIUM 8.2* 8.0*  --  8.0* 8.5* 8.7*  --     Total Time in preparing paper work, data evaluation and todays exam - 35 minutes  Signature  -    Susa Raring M.D on 02/16/2023 at 11:49 AM   -  To page go to [email or web address]

## 2023-02-16 NOTE — Progress Notes (Signed)
Occupational Therapy Treatment Patient Details Name: Anthony [name] MRN: [id number] DOB: 1955-12-06 Today's Date: 02/16/2023   History of present illness Anthony [name] is a 67 y.o. male who presents with SOB. CT scans +PE, acute L 3rd rib fx, subacute L 5th rib fx, comminuted fracture of the left clavicular head  PMH: alcohol abuse, housing insecurity, PE, depression, HTN, rhabdomyolysis, CVA.   OT comments  Pt making progress with functional goals. Pt in bed upon arrival and agreeable to OOB activity. Pt walked to bathroom using RW min A for toilet transfers and toileting min guard - min A, stood at sink for grooming/hygiene min guard A. Pt declined sitting up in chair and insisted on returning to bed. Pt sat EOB for UB dressing and stimulated bathing tasks. OT will continue to follow acutely to maximize level of function and safety   Recommendations for follow up therapy are one component of a multi-disciplinary discharge planning process, led by the attending physician.  Recommendations may be updated based on patient status, additional functional criteria and insurance authorization.    Assistance Recommended at Discharge Intermittent Supervision/Assistance  Patient can return home with the following  A [place] help with walking and/or transfers;A [place]on help with bathing/dressing/bathroom;Assistance with cooking/housework;Assist for transportation;Help with stairs or ramp for entrance   Equipment Recommendations  Other (comment) (TBD)    Recommendations for Other Services      Precautions / Restrictions Precautions Precautions: Fall Precaution Comments: AMS Restrictions Weight Bearing Restrictions: No       Mobility Bed Mobility Overal bed mobility: Needs Assistance Bed Mobility: Supine to Sit, Sit to Supine     Supine to sit: Supervision Sit to supine: Supervision        Transfers Overall transfer level: Needs assistance Equipment used: Rolling walker (2  wheels) Transfers: Sit to/from Stand Sit to Stand: Min guard                 Balance Overall balance assessment: Needs assistance Sitting-balance support: Feet supported, Bilateral upper extremity supported Sitting balance-Leahy Scale: Fair     Standing balance support: No upper extremity supported Standing balance-Leahy Scale: Fair                             ADL either performed or assessed with clinical judgement   ADL Overall ADL's : Needs assistance/impaired     Grooming: Wash/dry hands;Wash/dry face;Standing;Min guard;Brushing hair           Upper Body Dressing : Min guard;Sitting   Lower Body Dressing: Min guard;Sit to/from stand Lower Body Dressing Details (indicate cue type and reason): with increased time Toilet Transfer: Min guard;Ambulation;Rolling walker (2 wheels);Cueing for safety   Toileting- Clothing Manipulation and Hygiene: Minimal assistance;Sit to/from stand       Functional mobility during ADLs: Min guard;Rolling walker (2 wheels);Cueing for safety      Extremity/Trunk Assessment Upper Extremity Assessment Upper Extremity Assessment: Generalized weakness   Lower Extremity Assessment Lower Extremity Assessment: Defer to PT evaluation   Cervical / Trunk Assessment Cervical / Trunk Assessment: Normal    Vision Baseline Vision/History: 1 Wears glasses Ability to See in Adequate Light: 0 Adequate Patient Visual Report: No change from baseline     Perception     Praxis      Cognition Arousal/Alertness: Awake/alert Behavior During Therapy: WFL for tasks assessed/performed Overall Cognitive Status: No family/caregiver present to determine baseline cognitive functioning Area of Impairment: Memory, Following commands,  Safety/judgement, Awareness, Attention, Orientation, Problem solving                 Orientation Level: Disoriented to, Situation, Time   Memory: Decreased short-term memory   Safety/Judgement:  Decreased awareness of safety, Decreased awareness of deficits   Problem Solving: Slow processing, Difficulty sequencing, Requires verbal cues, Requires tactile cues General Comments: pt seemed to be unaware that he was not wearing his gown and that he was laying on it        Exercises      Shoulder Instructions       General Comments      Pertinent Vitals/ Pain       Pain Assessment Pain Assessment: 0-10 Pain Score: 4  Pain Location: back Pain Descriptors / Indicators: Aching Pain Intervention(s): Monitored during session  Home Living                                          Prior Functioning/Environment              Frequency  Min 1X/week        Progress Toward Goals  OT Goals(current goals can now be found in the care plan section)  Progress towards OT goals: Progressing toward goals     Plan Discharge plan remains appropriate    Co-evaluation                 AM-PAC OT "6 Clicks" Daily Activity     Outcome Measure   Help from another person eating meals?: None Help from another person taking care of personal grooming?: A [place] Help from another person toileting, which includes using toliet, bedpan, or urinal?: A [place] Help from another person bathing (including washing, rinsing, drying)?: A [place]ub[place] Help from another person to put on and taking off regular upper body clothing?: A [place] Help from another person to put on and taking off regular lower body clothing?: A [place] 6 Click Score: 19    End of Session Equipment Utilized During Treatment: Gait belt;Rolling walker (2 wheels)  OT Visit Diagnosis: Unsteadiness on feet (R26.81);Muscle weakness (generalized) (M62.81);Other (comment)   Activity Tolerance Patient tolerated treatment well   Patient Left in bed;with call bell/phone within reach;with bed alarm set   Nurse Communication          Time: [phone] OT Time Calculation (min): 25 min  Charges: OT General  Charges $OT Visit: 1 Visit OT Treatments $Self Care/Home Management : 8-22 mins $Therapeutic Activity: 8-22 mins    Galen Manila 02/16/2023, 2:12 PM

## 2023-02-16 NOTE — TOC Transition Note (Signed)
Transition of Care [facility]) - CM/SW Discharge Note   Patient Details  Name: Anthony [name] MRN: [id number] Date of Birth: 1956/02/23  [facility] of [facility] CM/SW Contact:  [name], LCSW Phone Number: 02/16/2023, 1:22 PM   Clinical Narrative:    [facility] Anticipated DC date: 02/16/23 Family notified: Pt declined Transport by: Sharin Mons   Per MD patient ready for DC to [facility]. RN to call report prior to discharge [phone] room 108). RN, patient, patient's family, and facility notified of DC. Discharge Summary and FL2 sent to facility. DC packet on chart. Ambulance transport requested for patient.   CSW will sign off for now as social work intervention is no longer needed. Please consult [place] again if new needs arise.     Final next level of care: Skilled Nursing Facility Barriers to Discharge: Barriers Resolved   Patient Goals and CMS Choice CMS Medicare.gov Compare Post Acute Care list provided to:: Patient Choice offered to / list presented to : Patient  Discharge Placement     Existing PASRR number confirmed : 02/16/23          [name]) Patient to be transferred to facility by: [facility] Name of family member notified: None Patient and family notified of of transfer: 02/16/23  Discharge Plan and Services Additional resources added to the After Visit Summary for   In-house Referral: Clinical Social Work   Post Acute Care Choice: Skilled Nursing Facility                               Social Determinants of Health (SDOH) Interventions SDOH Screenings   Food Insecurity: Food Insecurity Present (12/11/2022)  Housing: High Risk (12/02/2022)  Transportation Needs: Unmet Transportation Needs (12/11/2022)  Utilities: Not At Risk (12/11/2022)  Recent Concern: Utilities - At Risk (12/02/2022)  Depression (PHQ2-9): Medium Risk (10/16/2020)  Tobacco Use: High Risk (02/04/2023)     Readmission Risk  Interventions    02/08/2023    5:25 PM 10/12/2022    1:29 PM 08/28/2022    2:00 PM  Readmission Risk Prevention Plan  Transportation Screening Complete Complete Complete  Medication Review Oceanographer) Complete Complete   PCP or Specialist appointment within 3-5 days of discharge Complete Complete Complete  HRI or Home Care Consult Complete Complete   SW Recovery Care/Counseling Consult Complete Complete Complete  Palliative Care Screening Not Applicable Not Applicable Not Applicable  Skilled Nursing Facility Complete Not Applicable Not Applicable

## 2023-02-16 NOTE — Progress Notes (Signed)
[facility] Progress Note  Patient: Anthony [name]    UJW:[id number]  DOA: 02/03/2023     Date of Service: the patient was seen and examined on 02/16/2023  Chief Complaint  Patient presents with   Shortness of Breath   Hypoglycemia   Brief hospital course:  Brief Narrative (HPI from H&P)   67 y.o. male with medical history significant of alcohol abuse, anxiety, HTN, homelessness, CVA who presented with chest pain and shortness of breath.  He was found to have a pulmonary embolism as well as atrial fibrillation with RVR and soft blood pressures, hyponatremic with edema, cardiology was consulted and he was admitted to the hospital.  Transferred to my care on 02/06/2023  Assessment and Plan:  Acute Pulmonary Embolism with Infarct:  CT PE protocol with acute segmental and subsegmental PE in posterior RLL with associated developing pulmonary infarct in the lateral basal segment, negative lower extremity venous duplex.  Echo noted with reduction in RV systolic function.  Now transitioned from heparin drip to Eliquis.   Acute Fracture of L Lateral 3rd Rib with Associated Pulmonary Contusion:  Unchanged Comminuted Fracture of the L Clavicular Head and Subacute Fracture of the L Lateral 5th Rib, supportive care, PT OT, I-S for pulmonary toiletry.  Encouraged to sit in chair in daytime.  Monitor.   Atrial Fibrillation with RVR: Advised to score of at least 4.  Echo noted, discussed with cardiology on 02/06/2023 Dr. Jens Som, TSH stable currently on amiodarone which will be continued, Eliquis.  Acute on chronic systolic left-sided heart failure along with RV systolic dysfunction as well.  Hold further fluids, diurese as tolerated by blood pressure and monitor.  Blood pressure too low for ACE/ARB/Entresto, low-dose beta-blocker if tolerated.  Seen by cardiology this admission nothing else to offer.   New right-sided pleural effusion.  Seen by pulmonary, repeat CT checked on 02/06/2023, does not look like  hemothorax, underwent bedside ultrasound-guided thoracentesis on 02/06/2023 fluid appears to be transudative, outpatient pulmonary follow-up postdischarge.      Hyponatremia  - Has bilateral LE edema on exam, further IV fluids and diurese with Lasix and monitor   Non anion gap metabolic acidosis - Likely contraction alkalosis from intravascular depletion, third spacing of fluids and to some extent lactic acidosis.  Has been adequately hydrated.  Add protein supplements, diurese, this problem has resolved.   Etoh Abuse  Drinks 6 pack daily consult to quit, was on Librium tapered & CIWA protocol.   Metabolic encephalopathy.  Getting worse at nighttime likely due to hospital delirium, ripping IVs, has had 10 IVs placed so far, placed on Seroquel twice daily, QTc on telemetry on 02/08/2023 is 420 ms.  Encephalopathy has improved.   Dyslipidemia  Crestor   Hypomagnesemia   Replaced, Continue to monitor   Chronic hypotension   blood pressure gradually improving, he is chronically on midodrine which will be continued   Hypoglycemia  Due to poor oral intake, stable random TSH and a.m. cortisol, monitor CBGs.  Metabolic acidosis, started oral bicarbonate.  Iron deficiency, transferrin saturation 6%, continue oral iron supplement.  Follow with PCP to repeat iron profile after 3 to 6 months. B12 and folic acid within normal range  Vitamin D Insufficiency: started vitamin D 50,000 units p.o. weekly, follow with PCP to repeat vitamin D level after 3 to 6 months.    Body mass index is 21.72 kg/m.  Nutrition Problem: Increased nutrient needs Etiology: chronic illness (CHF) Interventions: Interventions: MVI, Liberalize Diet   Diet:  Regular diet DVT Prophylaxis: Therapeutic Anticoagulation with Eliquis    Advance goals of care discussion: Full code  Family Communication: family was Not present at bedside, at the time of interview.  The pt provided permission to discuss medical plan with the  family. Opportunity was given to ask question and all questions were answered satisfactorily.   Disposition:  Pt is homeless, admitted with EtOH abuse, pulmonary embolism, A-fib with RVR, still has AMS, which precludes a safe discharge. Discharge to SNF, when stable.  TOC following for placement  Subjective: Patient in bed, appears comfortable, denies any headache, no fever, no chest pain or pressure, no shortness of breath , no abdominal pain. No new focal weakness.   Physical Exam:  Awake but pleasantly confused, No new F.N deficits, Normal affect [place].AT,PERRAL Supple Neck, No JVD,   Symmetrical Chest wall movement, Good air movement bilaterally, CTAB RRR,No Gallops, Rubs or new Murmurs,  +ve B.Sounds, Abd Soft, No tenderness,   No Cyanosis, Clubbing or edema    Vitals:   02/15/23 2026 02/16/23 0000 02/16/23 0722 02/16/23 0827  BP: 113/86 95/69  106/82  Pulse: 72 74 73   Resp:  19  14  Temp:  98.2 F (36.8 C)  98 F (36.7 C)  TempSrc:  Oral  Oral  SpO2:  97% 98%   Weight:      Height:        Intake/Output Summary (Last 24 hours) at 02/16/2023 0928 Last data filed at 02/15/2023 2000 Gross per 24 hour  Intake 240 ml  Output 200 ml  Net 40 ml   Filed Weights   02/10/23 0500 02/11/23 0300 02/14/23 0456  Weight: 69.1 kg 62.2 kg 66.7 kg    Data Reviewed:  Recent Labs  Lab 02/10/23 0222 02/13/23 0347 02/14/23 0107 02/15/23 0655  WBC 4.1 6.2 5.9 4.4  HGB 9.2* 8.7* 8.8* 9.4*  HCT 28.2* 27.1* 27.5* 29.7*  PLT 274 241 243 230  MCV 104.8* 102.3* 107.0* 106.1*  MCH 34.2* 32.8 34.2* 33.6  MCHC 32.6 32.1 32.0 31.6  RDW 23.9* 24.2* 23.9* 23.5*  LYMPHSABS 1.2  --   --   --   MONOABS 0.6  --   --   --   EOSABS 0.1  --   --   --   BASOSABS 0.0  --   --   --     Recent Labs  Lab 02/10/23 0222 02/11/23 0609 02/12/23 0111 02/13/23 0347 02/14/23 0107 02/15/23 0655 02/16/23 0320  NA 139 139  --  137 138 137  --   K 4.0 4.1  --  4.2 4.1 3.9  --   CL 108 107  --   105 104 104  --   CO2 17* 20*  --  24 24 23   --   ANIONGAP 14 12  --  8 10 10   --   GLUCOSE 82 71  --  91 85 86  --   BUN 18 13  --  7* 11 12  --   CREATININE 1.04 0.83  --  0.73 0.79 0.86  --   AST 48* 43*  --   --   --   --   --   ALT 23 23  --   --   --   --   --   ALKPHOS 136* 116  --   --   --   --   --   BILITOT 0.9 1.1  --   --   --   --   --  ALBUMIN 2.7* 2.5*  --   --   --   --   --   BNP 1,666.7*  --   --   --   --   --   --   MG 1.4* 1.7 1.3* 1.6* 1.3* 1.5* 1.7  CALCIUM 8.2* 8.0*  --  8.0* 8.5* 8.7*  --     Recent Labs  Lab 02/10/23 0222 02/11/23 0609 02/12/23 0111 02/13/23 0347 02/14/23 0107 02/15/23 0655 02/16/23 0320  BNP 1,666.7*  --   --   --   --   --   --   MG 1.4* 1.7 1.3* 1.6* 1.3* 1.5* 1.7  CALCIUM 8.2* 8.0*  --  8.0* 8.5* 8.7*  --      Studies: No results found.  Scheduled Meds:  (feeding supplement) PROSource Plus  30 mL Oral BID BM   amiodarone  200 mg Oral Daily   apixaban  5 mg Oral BID   bisacodyl  10 mg Oral BID   feeding supplement  237 mL Oral BID BM   ferrous sulfate  325 mg Oral Q breakfast   folic acid  1 mg Oral Daily   guaiFENesin  600 mg Oral BID   lidocaine  3 patch Transdermal Q24H   metoprolol tartrate  25 mg Oral BID   midodrine  10 mg Oral TID WC   mometasone-formoterol  2 puff Inhalation BID   multivitamin with minerals  1 tablet Oral Daily   pantoprazole  40 mg Oral Daily   polyethylene glycol  17 g Oral BID   QUEtiapine  50 mg Oral BID   rosuvastatin  10 mg Oral Daily   thiamine  100 mg Oral Daily   Vitamin D (Ergocalciferol)  50,000 Units Oral Q7 days   Continuous Infusions:   PRN Meds: [COMPLETED] acetaminophen **FOLLOWED BY** acetaminophen, albuterol, bisacodyl **FOLLOWED BY** bisacodyl, bisacodyl, hydrOXYzine, [DISCONTINUED] ondansetron **OR** ondansetron (ZOFRAN) IV, traMADol  Time spent: 35 minutes  Signature  -    Susa Raring M.D on 02/16/2023 at 9:28 AM   -  To page go to [email or web address]

## 2023-02-16 NOTE — Progress Notes (Signed)
Overview: Anthony [name] is a 67 y.o. with PMH of significant of alcohol abuse, anxiety, HTN, homelessness, CVA who presented with chest pain and shortness of breath. He was found to have a pulmonary embolism as well as atrial fibrillation with RVR and soft blood pressures, hyponatremic with edema, cardiology was consulted and he was admitted to the hospital.  Pt is admitted for PE c/b infarction on hospital day 12.   Subjective:  Overnight:NAEON  States doing well. Stated he needed more information regarding the SNF as he heard its not a good place. Discussed the need for SNF in his case given the deconditioning. Pt more understanding.   Objective: Afebrile, slightly hypotenisve but MAP>65. 98 % on RA.  Vital signs in last 24 hours: Vitals:   02/15/23 2000 02/15/23 2026 02/16/23 0000 02/16/23 0722  BP: 113/86 113/86 95/69   Pulse:  72 74 73  Resp: (!) 21  19   Temp: 97.9 F (36.6 C)  98.2 F (36.8 C)   TempSrc: Oral  Oral   SpO2: 97%  97% 98%  Weight:      Height:       Supplemental O2: Room Air Last BM Date : 02/14/23 SpO2: 98 % Filed Weights   02/10/23 0500 02/11/23 0300 02/14/23 0456  Weight: 69.1 kg 62.2 kg 66.7 kg    Intake/Output Summary (Last 24 hours) at 02/16/2023 0758 Last data filed at 02/15/2023 2000 Gross per 24 hour  Intake 240 ml  Output 200 ml  Net 40 ml   Net IO Since Admission: 466.54 mL [02/16/23 0758]  Physical Exam General: NAD HENT: NCAT Lungs: CTAB Cardiovascular: IRIR  Abdomen: No TTP, normal bowel sounds MSK: no asymmetry Skin: no lesion on skin Neuro: alert and oriented Psych: normal mood and normal affect  Diagnostics Magnesium 1.7  Assessment/Plan: Principal Problem:   Pulmonary embolism (HCC) Active Problems:   HTN (hypertension)   Dehydration   Paroxysmal atrial fibrillation (HCC)   Hypomagnesemia   Atrial fibrillation with RVR (HCC)   Alcohol abuse   Dyslipidemia   Hyponatremia   Rib fractures   Hypoglycemia    Pulmonary embolus (HCC)  Acute PE with Infarct:  On eliquis. Duplex negative for DVT. Plan to continue eliquis.  Acute Metabolic Encephalopathy: Improving. Alert and oriented x4 this am. Has waxing and waning nature. On Seroquel 50 mg BID.   Acute fracture of L Lateral 3rd Rib with Associated Pulmonary Contusion:  Unchanged Comminuted Fracture of the L Clavicular Head and Subacute Fracture of the L Lateral 5th Rib, supportive care, PT OT, I-S for pulmonary toiletry. Encouraged to sit in chair in daytime. Monitor   Afib with RVR: on Amiodarone and Eliqius: HR controlled.  Acute on Chronic HF: Cardiology following. Holding diuresis. BP too low for ACE/ARB/Entresto, will start with low dose beta blocker if tolerated. Appreciate cardiology recommendations.   Right Sided Pleural Effusion: Seen by pulmonary and thoracentesis was performed and fluid appears to be transudative.   Alcohol Use Disorder: CIWA 0. Was on librium taper. Will continue MVA, thiamine, and folic acid.   Chronic Problems:  HLD: on Crestor IDA: Tsat is 6%. On oral iron. Needs iron studies in 3-6 months by PCP Vitamin D Deficiency: On 50000 units weekly, will need repeat levels in 3 to 6 months NAGMA: Resolved Hyponatremia: Resolved.   Diet: Normal IVF: None, VTE: NOAC Code: Full PT/OT recs: Pending, walker. Prior to Admission Living Arrangement: Home Anticipated Discharge Location: Home Barriers to Discharge: Medical Stability Dispo:  Anticipated discharge in approximately 3 day(s).   Gwenevere Abbot, MD Eligha Bridegroom. [facility] Res[facility], PGY-3  Pager: [phone] After 5 pm and on weekends: Please call the on-call pager

## 2023-02-16 NOTE — Plan of Care (Signed)

## 2023-02-16 NOTE — TOC Progression Note (Signed)
Transition of Care [facility]) - Progression Note    Patient Details  Name: Anthony [name] MRN: [id number] Date of Birth: 1955-12-12  Transition of Care [facility]) CM/SW Contact  [name], [place] Phone Number: 02/16/2023, 12:23 PM  Clinical Narrative:    [name] has received insurance approval, Auth ID [id number]. CSW and SNF liaison met with patient and he requested help with clothing. Tammy stated she will se what the SNF has in donations. Patient's jeans and shoes are at bedside.     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, SNF Pending bed offer  Expected Discharge Plan and Services In-house Referral: Clinical Social Work   Post Acute Care Choice: Skilled Nursing Facility Living arrangements for the past 2 months: Homeless Shelter Expected Discharge Date: 02/16/23                                     Social Determinants of Health (SDOH) Interventions SDOH Screenings   Food Insecurity: Food Insecurity Present (12/11/2022)  Housing: High Risk (12/02/2022)  Transportation Needs: Unmet Transportation Needs (12/11/2022)  Utilities: Not At Risk (12/11/2022)  Recent Concern: Utilities - At Risk (12/02/2022)  Depression (PHQ2-9): Medium Risk (10/16/2020)  Tobacco Use: High Risk (02/04/2023)    Readmission Risk Interventions    02/08/2023    5:25 PM 10/12/2022    1:29 PM 08/28/2022    2:00 PM  Readmission Risk Prevention Plan  Transportation Screening Complete Complete Complete  Medication Review Oceanographer) Complete Complete   PCP or Specialist appointment within 3-5 days of discharge Complete Complete Complete  HRI or Home Care Consult Complete Complete   SW Recovery Care/Counseling Consult Complete Complete Complete  Palliative Care Screening Not Applicable Not Applicable Not Applicable  Skilled Nursing Facility Complete Not Applicable Not Applicable

## 2023-02-16 NOTE — Discharge Instructions (Signed)
Follow with Primary MD Pcp, No in 7 days   Get CBC, CMP, 2 view Chest X ray -  checked next visit with your primary MD or SNF MD   Activity: As tolerated with Full fall precautions use walker/cane & assistance as needed  Disposition SNF  Diet: Heart Healthy  Check your Weight same time everyday, if you gain over 2 pounds, or you develop in leg swelling, experience more shortness of breath or chest pain, call your Primary MD immediately. Follow Cardiac Low Salt Diet and 1.5 lit/day fluid restriction.  Special Instructions: If you have smoked or chewed Tobacco  in the last 2 yrs please stop smoking, stop any regular Alcohol  and or any Recreational drug use.  On your next visit with your primary care physician please Get Medicines reviewed and adjusted.  Please request your Prim.MD to go over all Hospital Tests and Procedure/Radiological results at the follow up, please get all Hospital records sent to your Prim MD by signing hospital release before you go home.  If you experience worsening of your admission symptoms, develop shortness of breath, life threatening emergency, suicidal or homicidal thoughts you must seek medical attention immediately by calling 911 or calling your MD immediately  if symptoms less severe.  You Must read complete instructions/literature along with all the possible adverse reactions/side effects for all the Medicines you take and that have been prescribed to you. Take any new Medicines after you have completely understood and accpet all the possible adverse reactions/side effects.

## 2023-03-06 ENCOUNTER — Emergency Department (HOSPITAL_COMMUNITY): Payer: 59

## 2023-03-06 ENCOUNTER — Other Ambulatory Visit: Payer: Self-pay

## 2023-03-06 ENCOUNTER — Emergency Department (HOSPITAL_COMMUNITY)
Admission: EM | Admit: 2023-03-06 | Discharge: 2023-03-06 | Disposition: A | Discharge status: Home / Self Care | Payer: 59 | Attending: Emergency Medicine | Admitting: Emergency Medicine

## 2023-03-06 ENCOUNTER — Encounter (HOSPITAL_COMMUNITY): Payer: Self-pay

## 2023-03-06 DIAGNOSIS — E871 Hypo-osmolality and hyponatremia: Secondary | ICD-10-CM | POA: Diagnosis not present

## 2023-03-06 DIAGNOSIS — I5023 Acute on chronic systolic (congestive) heart failure: Secondary | ICD-10-CM | POA: Diagnosis not present

## 2023-03-06 DIAGNOSIS — I4819 Other persistent atrial fibrillation: Secondary | ICD-10-CM

## 2023-03-06 DIAGNOSIS — Y906 Blood alcohol level of 120-199 mg/100 ml: Secondary | ICD-10-CM | POA: Diagnosis not present

## 2023-03-06 DIAGNOSIS — F10920 Alcohol use, unspecified with intoxication, uncomplicated: Secondary | ICD-10-CM

## 2023-03-06 DIAGNOSIS — Z79899 Other long term (current) drug therapy: Secondary | ICD-10-CM | POA: Diagnosis not present

## 2023-03-06 DIAGNOSIS — Z7901 Long term (current) use of anticoagulants: Secondary | ICD-10-CM | POA: Insufficient documentation

## 2023-03-06 DIAGNOSIS — I110 Hypertensive heart disease with heart failure: Secondary | ICD-10-CM | POA: Diagnosis not present

## 2023-03-06 DIAGNOSIS — R0602 Shortness of breath: Secondary | ICD-10-CM

## 2023-03-06 DIAGNOSIS — F10120 Alcohol abuse with intoxication, uncomplicated: Secondary | ICD-10-CM | POA: Insufficient documentation

## 2023-03-06 DIAGNOSIS — D539 Nutritional anemia, unspecified: Secondary | ICD-10-CM | POA: Diagnosis not present

## 2023-03-06 DIAGNOSIS — Z5900 Homelessness unspecified: Secondary | ICD-10-CM | POA: Diagnosis not present

## 2023-03-06 LAB — CBC WITH DIFFERENTIAL/PLATELET
Abs Immature Granulocytes: 0.04 10*3/uL (ref 0.00–0.07)
Basophils Absolute: 0.1 10*3/uL (ref 0.0–0.1)
Basophils Relative: 1 %
Eosinophils Absolute: 0 10*3/uL (ref 0.0–0.5)
Eosinophils Relative: 0 %
HCT: 35.3 % — ABNORMAL LOW (ref 39.0–52.0)
Hemoglobin: 10.9 g/dL — ABNORMAL LOW (ref 13.0–17.0)
Immature Granulocytes: 1 %
Lymphocytes Relative: 34 %
Lymphs Abs: 2.1 10*3/uL (ref 0.7–4.0)
MCH: 33.5 pg (ref 26.0–34.0)
MCHC: 30.9 g/dL (ref 30.0–36.0)
MCV: 108.6 fL — ABNORMAL HIGH (ref 80.0–100.0)
Monocytes Absolute: 0.5 10*3/uL (ref 0.1–1.0)
Monocytes Relative: 8 %
Neutro Abs: 3.5 10*3/uL (ref 1.7–7.7)
Neutrophils Relative %: 56 %
Platelets: 218 10*3/uL (ref 150–400)
RBC: 3.25 MIL/uL — ABNORMAL LOW (ref 4.22–5.81)
RDW: 19.4 % — ABNORMAL HIGH (ref 11.5–15.5)
WBC: 6.2 10*3/uL (ref 4.0–10.5)
nRBC: 0 % (ref 0.0–0.2)

## 2023-03-06 LAB — COMPREHENSIVE METABOLIC PANEL
ALT: 32 U/L (ref 0–44)
AST: 45 U/L — ABNORMAL HIGH (ref 15–41)
Albumin: 4 g/dL (ref 3.5–5.0)
Alkaline Phosphatase: 98 U/L (ref 38–126)
Anion gap: 12 (ref 5–15)
BUN: 18 mg/dL (ref 8–23)
CO2: 19 mmol/L — ABNORMAL LOW (ref 22–32)
Calcium: 9.2 mg/dL (ref 8.9–10.3)
Chloride: 101 mmol/L (ref 98–111)
Creatinine, Ser: 0.79 mg/dL (ref 0.61–1.24)
GFR, Estimated: >60 mL/min (ref >60)
Glucose, Bld: 83 mg/dL (ref 70–99)
Potassium: 3.7 mmol/L (ref 3.5–5.1)
Sodium: 132 mmol/L — ABNORMAL LOW (ref 135–145)
Total Bilirubin: 0.6 mg/dL (ref 0.3–1.2)
Total Protein: 8.1 g/dL (ref 6.5–8.1)

## 2023-03-06 LAB — TROPONIN I (HIGH SENSITIVITY): Troponin I (High Sensitivity): 9 ng/L (ref <18)

## 2023-03-06 LAB — CBG MONITORING, ED: Glucose-Capillary: 75 mg/dL (ref 70–99)

## 2023-03-06 LAB — BRAIN NATRIURETIC PEPTIDE: B Natriuretic Peptide: 773.6 pg/mL — ABNORMAL HIGH (ref 0.0–100.0)

## 2023-03-06 LAB — ETHANOL: Alcohol, Ethyl (B): 120 mg/dL — ABNORMAL HIGH (ref <10)

## 2023-03-06 LAB — MAGNESIUM: Magnesium: 1.7 mg/dL (ref 1.7–2.4)

## 2023-03-06 MED ORDER — METOPROLOL TARTRATE 25 MG PO TABS: 25.0000 mg | ORAL_TABLET | Freq: Two times a day (BID) | ORAL | 0 refills | Status: DC

## 2023-03-06 MED ORDER — ROSUVASTATIN CALCIUM 10 MG PO TABS: 10.0000 mg | ORAL_TABLET | Freq: Every day | ORAL | Status: DC

## 2023-03-06 MED ORDER — SODIUM CHLORIDE (PF) 0.9 % IJ SOLN
INTRAMUSCULAR | Status: AC
  Filled 2023-03-06: qty 50

## 2023-03-06 MED ORDER — APIXABAN 5 MG PO TABS: 5.0000 mg | ORAL_TABLET | Freq: Two times a day (BID) | ORAL | 0 refills | Status: DC

## 2023-03-06 MED ORDER — ALBUTEROL SULFATE HFA 108 (90 BASE) MCG/ACT IN AERS: 2.0000 | INHALATION_SPRAY | RESPIRATORY_TRACT | Status: DC | PRN

## 2023-03-06 MED ORDER — APIXABAN 5 MG PO TABS: 5.0000 mg | ORAL_TABLET | Freq: Two times a day (BID) | ORAL | Status: DC

## 2023-03-06 MED ORDER — MIDODRINE HCL 10 MG PO TABS: 10.0000 mg | ORAL_TABLET | Freq: Three times a day (TID) | ORAL | 0 refills | Status: AC

## 2023-03-06 MED ORDER — FUROSEMIDE 10 MG/ML IJ SOLN
40.0000 mg | Freq: Once | INTRAMUSCULAR | Status: AC
  Administered 2023-03-06: 40 mg via INTRAVENOUS
  Filled 2023-03-06: qty 4

## 2023-03-06 MED ORDER — IOHEXOL 350 MG/ML SOLN
75.0000 mL | Freq: Once | INTRAVENOUS | Status: AC | PRN
  Administered 2023-03-06: 75 mL via INTRAVENOUS

## 2023-03-06 NOTE — ED Triage Notes (Signed)
pT. Bib [facility] for SOB. Pt. Denies chest pain. Pt. VSS with EMS. Pt. Has slight bilateral ankle swelling.

## 2023-03-06 NOTE — ED Provider Notes (Signed)
EMERGENCY DEPARTMENT AT [facility] Provider Note   CSN: [id number] Arrival date & time: 03/06/23  0235     History  Chief Complaint  Patient presents with   Shortness of Breath    Anthony [name]. is a 67 y.o. male.  The history is provided by the patient.  Shortness of Breath He has history of hypertension, stroke, atrial fibrillation anticoagulated on apixaban, alcohol abuse, homelessness and comes in because of shortness of breath with exertion.  He is clinically intoxicated and a very difficult historian.  He denies chest discomfort.  He does admit that he has not been taking his medications because he does not know what they are for.  He does admit to drinking half a can of beer today.   Home Medications Prior to Admission medications   Medication Sig Start Date End Date Taking? Authorizing Provider  acetaminophen (TYLENOL) 500 MG tablet Take 1 tablet (500 mg total) by mouth every 8 (eight) hours as needed. Patient taking differently: Take 1,000 mg by mouth every 6 (six) hours as needed (for pain). 10/13/22   Leatha Gilding, MD  amiodarone (PACERONE) 200 MG tablet Take 1 tablet (200 mg total) by mouth daily. 12/27/22   Regalado, Belkys A, MD  apixaban (ELIQUIS) 5 MG TABS tablet Take 1 tablet (5 mg total) by mouth 2 (two) times daily. 02/16/23   Leroy Sea, MD  ferrous sulfate 325 (65 FE) MG tablet Take 1 tablet (325 mg total) by mouth daily with breakfast. 12/18/22 01/17/23  Regalado, Jon Billings A, MD  folic acid (FOLVITE) 1 MG tablet Take 1 tablet (1 mg total) by mouth daily. 02/16/23   Leroy Sea, MD  magnesium oxide (MAG-OX) 400 (240 Mg) MG tablet Take 1 tablet (400 mg total) by mouth 2 (two) times daily. 02/16/23   Leroy Sea, MD  metoprolol tartrate (LOPRESSOR) 25 MG tablet Take 1 tablet (25 mg total) by mouth 2 (two) times daily. 02/16/23   Leroy Sea, MD  midodrine (PROAMATINE) 10 MG tablet Take 1 tablet (10 mg total) by mouth 3  (three) times daily with meals. 02/16/23   Leroy Sea, MD  mometasone-formoterol (DULERA) 200-5 MCG/ACT AERO Inhale 2 puffs into the lungs 2 (two) times daily. 02/16/23   Leroy Sea, MD  pantoprazole (PROTONIX) 40 MG tablet Take 1 tablet (40 mg total) by mouth daily. 02/17/23   Leroy Sea, MD  QUEtiapine (SEROQUEL) 50 MG tablet Take 1 tablet (50 mg total) by mouth 2 (two) times daily. 02/16/23   Leroy Sea, MD  rosuvastatin (CRESTOR) 10 MG tablet Take 1 tablet (10 mg total) by mouth daily. 02/16/23   Leroy Sea, MD  thiamine (VITAMIN B-1) 100 MG tablet Take 1 tablet (100 mg total) by mouth daily. 02/17/23   Leroy Sea, MD      Allergies    Other and Poison ivy extract    Review of Systems   Review of Systems  Respiratory:  Positive for shortness of breath.   All other systems reviewed and are negative.   Physical Exam Updated Vital Signs BP 107/82 (BP Location: Right Arm)   Pulse (!) 105   Temp 98.6 F (37 C) (Oral)   Resp 18   Ht 5\' 9"  (1.753 m)   Wt 64 kg   SpO2 100%   BMI 20.82 kg/m  Physical Exam Vitals and nursing note reviewed.   67 year old male, resting comfortably and  in no acute distress. Vital signs are significant for slightly elevated heart rate. Oxygen saturation is 100%, which is normal. Head is normocephalic and atraumatic. PERRLA, EOMI.  Neck is nontender and supple without adenopathy or JVD. Back is nontender and there is no CVA tenderness.  There is no presacral edema. Lungs are clear without rales, wheezes, or rhonchi. Chest is nontender. Heart has r an irregular rhythm without murmur. Abdomen is soft, flat, nontender. Extremities have 2+ edema, full range of motion is present. Skin is warm and dry without rash. Neurologic: Awake and alert, speech is slow and slurred consistent with alcohol intoxication, cranial nerves are intact, moves all extremities equally.  ED Results / Procedures / Treatments   Labs (all labs  ordered are listed, but only abnormal results are displayed) Labs Reviewed  CBC WITH DIFFERENTIAL/PLATELET - Abnormal; Notable for the following components:      Result Value   RBC 3.25 (*)    Hemoglobin 10.9 (*)    HCT 35.3 (*)    MCV 108.6 (*)    RDW 19.4 (*)    All other components within normal limits  COMPREHENSIVE METABOLIC PANEL  CBG MONITORING, ED    EKG EKG Interpretation Date/Time:  Saturday March 06 2023 02:56:50 EDT Ventricular Rate:  98 PR Interval:    QRS Duration:  111 QT Interval:  395 QTC Calculation: 505 R Axis:   105  Text Interpretation: Atrial fibrillation Anteroseptal infarct, age indeterminate Prolonged QT interval When compared with ECG of 02/03/2023, No significant change was found Confirmed by Dione Booze ([id number]) on 03/06/2023 3:04:57 AM  Radiology DG Chest 2 View  Result Date: 03/06/2023 CLINICAL DATA:  Shortness of breath.  Bilateral ankle swelling. EXAM: CHEST - 2 VIEW COMPARISON:  Radiograph 02/07/2023 FINDINGS: Stable cardiomediastinal silhouette. Aortic atherosclerotic calcification. No focal consolidation or pneumothorax. Trace right pleural effusion. No displaced rib fractures. IMPRESSION: Trace right pleural effusion, decreased from 02/07/2023. Electronically Signed   By: Minerva Fester M.D.   On: 03/06/2023 03:25    Procedures Procedures    Medications Ordered in ED Medications  albuterol (VENTOLIN HFA) 108 (90 Base) MCG/ACT inhaler 2 puff (has no administration in time range)    ED Course/ Medical Decision Making/ A&P                                 Medical Decision Making Amount and/or Complexity of Data Reviewed Labs: ordered. Radiology: ordered.  Risk Prescription drug management.   Dyspnea on exertion which clinically appears to be heart failure.  Consider angina equivalent, pneumonia, pleural effusion, pulmonary embolism.  I have reviewed and interpreted his electrocardiogram and my interpretation is atrial fibrillation  essentially unchanged from prior.  Chest x-ray shows trace right pleural effusion but no evidence of pneumonia or pulmonary edema.  I have independently viewed the images, and agree with radiologist's interpretation.  I have reviewed and interpreted his laboratory test, my interpretation is as stable anemia, stable macrocytosis consistent with liver disease from alcohol abuse.  Comprehensive metabolic panel is pending.  I have added BNP, troponin, ethanol level, magnesium to his lab testing.  I have ordered a dose of intravenous furosemide.  I have reviewed his past records, and he was admitted 12/02/2022-12/18/2022 for atrial fibrillation with rapid ventricular response, admitted 02/03/2023-02/16/2023 for pulmonary embolism and atrial fibrillation with rapid ventricular response.  He was noted to have acute on chronic systolic heart failure.  Echocardiogram on 02/04/2023  showed left ventricular ejection fraction 40-45% with indeterminate diastolic function because of atrial fibrillation.  With patient's medication noncompliance, he is at risk for new pulmonary emboli, I have ordered a CT angiogram to evaluate this.  Comprehensive metabolic panel shows mild hyponatremia which is not felt to be clinically significant, borderline elevated AST likely secondary to ethanol abuse but not felt to be clinically significant.  Magnesium level is normal 1.7, troponin is normal at 9, BNP is pending.  Ethanol level was 120 indicative of legal alcohol intoxication.  CT angiogram is pending.  Case is signed out to Dr. Jacqulyn Bath.  If CT angiogram shows new pulmonary emboli, may need to consider admitting him on heparin.  If no new pulmonary emboli, he can be discharged on apixaban and furosemide.  Final Clinical Impression(s) / ED Diagnoses Final diagnoses:  SOB (shortness of breath)  Acute on chronic systolic heart failure (HCC)  Hyponatremia  Macrocytic anemia  Alcohol intoxication, uncomplicated (HCC)  Homeless  Persistent  atrial fibrillation [facility])    Rx / DC Orders ED Discharge Orders     None         Dione Booze, MD 03/06/23 [phone]

## 2023-03-06 NOTE — Discharge Instructions (Signed)

## 2023-03-06 NOTE — ED Notes (Signed)
Pt smells like beer and has a case of beer in his personal bag.

## 2023-03-06 NOTE — ED Provider Notes (Signed)
Blood pressure (!) 144/98, pulse 91, temperature 98.5 F (36.9 C), temperature source Oral, resp. rate 12, height 5\' 9"  (1.753 m), weight 64 kg, SpO2 100%.  Assuming care from Dr. Preston Fleeting.  In short, Anthony [name]. is a 67 y.o. male with a chief complaint of Shortness of Breath .  Refer to the original H&P for additional details.  The current plan of care is to f/u on CTA and reassess. Patient has not been complaint with Eliquis or other medications.   09:53 AM  CT PE study interpreted by me and agree with radiology interpretation.  No new/acute findings. No large PE.  Patient is not currently with a primary care doctor.  He tells me he does have the means to pick up his prescriptions from the pharmacy and would like for me to send them to the [facility] on [address].  Plan to refill medications and give information for PCP follow up. He is diuresing well in the ED. No increased WOB. No hypoxemia.    Maia Plan, MD 03/06/23 [phone]

## 2023-03-08 ENCOUNTER — Encounter (HOSPITAL_COMMUNITY): Payer: Self-pay

## 2023-03-08 ENCOUNTER — Other Ambulatory Visit: Payer: Self-pay

## 2023-03-08 ENCOUNTER — Emergency Department (HOSPITAL_COMMUNITY)
Admission: EM | Admit: 2023-03-08 | Discharge: 2023-03-08 | Disposition: A | Discharge status: Home / Self Care | Payer: 59 | Attending: Emergency Medicine | Admitting: Emergency Medicine

## 2023-03-08 ENCOUNTER — Emergency Department (HOSPITAL_COMMUNITY): Payer: 59

## 2023-03-08 DIAGNOSIS — R5383 Other fatigue: Secondary | ICD-10-CM | POA: Insufficient documentation

## 2023-03-08 DIAGNOSIS — R0602 Shortness of breath: Secondary | ICD-10-CM | POA: Insufficient documentation

## 2023-03-08 DIAGNOSIS — Z1152 Encounter for screening for COVID-19: Secondary | ICD-10-CM | POA: Diagnosis not present

## 2023-03-08 DIAGNOSIS — Z7901 Long term (current) use of anticoagulants: Secondary | ICD-10-CM | POA: Diagnosis not present

## 2023-03-08 DIAGNOSIS — M791 Myalgia, unspecified site: Secondary | ICD-10-CM | POA: Diagnosis present

## 2023-03-08 LAB — RESP PANEL BY RT-PCR (RSV, FLU A&B, COVID)  RVPGX2
Influenza A by PCR: NEGATIVE
Influenza B by PCR: NEGATIVE
Resp Syncytial Virus by PCR: NEGATIVE
SARS Coronavirus 2 by RT PCR: NEGATIVE

## 2023-03-08 LAB — TROPONIN I (HIGH SENSITIVITY)
Troponin I (High Sensitivity): 7 ng/L (ref <18)
Troponin I (High Sensitivity): 6 ng/L (ref <18)

## 2023-03-08 LAB — CBC WITH DIFFERENTIAL/PLATELET
Abs Immature Granulocytes: 0.02 10*3/uL (ref 0.00–0.07)
Basophils Absolute: 0.1 10*3/uL (ref 0.0–0.1)
Basophils Relative: 1 %
Eosinophils Absolute: 0.1 10*3/uL (ref 0.0–0.5)
Eosinophils Relative: 3 %
HCT: 30.6 % — ABNORMAL LOW (ref 39.0–52.0)
Hemoglobin: 9.9 g/dL — ABNORMAL LOW (ref 13.0–17.0)
Immature Granulocytes: 0 %
Lymphocytes Relative: 27 %
Lymphs Abs: 1.4 10*3/uL (ref 0.7–4.0)
MCH: 33 pg (ref 26.0–34.0)
MCHC: 32.4 g/dL (ref 30.0–36.0)
MCV: 102 fL — ABNORMAL HIGH (ref 80.0–100.0)
Monocytes Absolute: 0.8 10*3/uL (ref 0.1–1.0)
Monocytes Relative: 15 %
Neutro Abs: 2.7 10*3/uL (ref 1.7–7.7)
Neutrophils Relative %: 54 %
Platelets: 191 10*3/uL (ref 150–400)
RBC: 3 MIL/uL — ABNORMAL LOW (ref 4.22–5.81)
RDW: 18.2 % — ABNORMAL HIGH (ref 11.5–15.5)
WBC: 5.1 10*3/uL (ref 4.0–10.5)
nRBC: 0 % (ref 0.0–0.2)

## 2023-03-08 LAB — ETHANOL: Alcohol, Ethyl (B): <10 mg/dL (ref <10)

## 2023-03-08 LAB — BASIC METABOLIC PANEL
Anion gap: 9 (ref 5–15)
BUN: 17 mg/dL (ref 8–23)
CO2: 21 mmol/L — ABNORMAL LOW (ref 22–32)
Calcium: 9.1 mg/dL (ref 8.9–10.3)
Chloride: 103 mmol/L (ref 98–111)
Creatinine, Ser: 0.77 mg/dL (ref 0.61–1.24)
GFR, Estimated: >60 mL/min (ref >60)
Glucose, Bld: 85 mg/dL (ref 70–99)
Potassium: 3.5 mmol/L (ref 3.5–5.1)
Sodium: 133 mmol/L — ABNORMAL LOW (ref 135–145)

## 2023-03-08 MED ORDER — APIXABAN 5 MG PO TABS
5.0000 mg | ORAL_TABLET | Freq: Once | ORAL | Status: AC
  Administered 2023-03-08: 5 mg via ORAL
  Filled 2023-03-08: qty 1

## 2023-03-08 MED ORDER — MIDODRINE HCL 5 MG PO TABS
10.0000 mg | ORAL_TABLET | Freq: Once | ORAL | Status: AC
  Administered 2023-03-08: 10 mg via ORAL
  Filled 2023-03-08: qty 2

## 2023-03-08 MED ORDER — METOPROLOL TARTRATE 25 MG PO TABS
25.0000 mg | ORAL_TABLET | Freq: Once | ORAL | Status: AC
  Administered 2023-03-08: 25 mg via ORAL
  Filled 2023-03-08: qty 1

## 2023-03-08 NOTE — ED Triage Notes (Signed)
Pt. BIB [facility] from [facility] for generalized body aches. Pt also states that he is short of breath VSS with EMS.

## 2023-03-08 NOTE — ED Notes (Signed)
Pt discharged. Discharge information discussed.  No s/s of distress observed during discharge.

## 2023-03-08 NOTE — Discharge Instructions (Addendum)
Return if any problems.

## 2023-03-08 NOTE — ED Notes (Signed)
Ambulated pt. Gait steady. Pt walks slow with neck slightly hyperflexed. Nurse instructed to look up several times while ambulating.

## 2023-03-08 NOTE — ED Notes (Signed)
Pt tolerated meal and drinks. No n/v observed.

## 2023-03-08 NOTE — ED Provider Notes (Signed)
[place] EMERGENCY DEPARTMENT AT [facility] Provider Note   CSN: [id number] Arrival date & time: 03/08/23  0423     History  Chief Complaint  Patient presents with   Generalized Body Aches    Anthony [name] Montez Hageman. is a 67 y.o. male.  Patient complains of feeling tired.  Patient reports he has a history of some recent shortness of breath.  Patient reports he is not currently short of breath.  Patient is somewhat of a poor historian.  He states he feels like he needs to rest.  Patient does not think he has any fever or any chills.  He denies any current chest pain or abdominal pain.  The history is provided by the patient. No language interpreter was used.       Home Medications Prior to Admission medications   Medication Sig Start Date End Date Taking? Authorizing Provider  acetaminophen (TYLENOL) 500 MG tablet Take 1 tablet (500 mg total) by mouth every 8 (eight) hours as needed. Patient taking differently: Take 1,000 mg by mouth every 6 (six) hours as needed (for pain). 10/13/22   Leatha Gilding, MD  amiodarone (PACERONE) 200 MG tablet Take 1 tablet (200 mg total) by mouth daily. 12/27/22   Regalado, Belkys A, MD  apixaban (ELIQUIS) 5 MG TABS tablet Take 1 tablet (5 mg total) by mouth 2 (two) times daily. 03/06/23 04/05/23  Long, Arlyss Repress, MD  ferrous sulfate 325 (65 FE) MG tablet Take 1 tablet (325 mg total) by mouth daily with breakfast. 12/18/22 01/17/23  Regalado, Jon Billings A, MD  folic acid (FOLVITE) 1 MG tablet Take 1 tablet (1 mg total) by mouth daily. 02/16/23   Leroy Sea, MD  magnesium oxide (MAG-OX) 400 (240 Mg) MG tablet Take 1 tablet (400 mg total) by mouth 2 (two) times daily. 02/16/23   Leroy Sea, MD  metoprolol tartrate (LOPRESSOR) 25 MG tablet Take 1 tablet (25 mg total) by mouth 2 (two) times daily. 03/06/23 04/05/23  Long, Arlyss Repress, MD  midodrine (PROAMATINE) 10 MG tablet Take 1 tablet (10 mg total) by mouth 3 (three) times daily with meals. 03/06/23  04/05/23  Long, Arlyss Repress, MD  mometasone-formoterol (DULERA) 200-5 MCG/ACT AERO Inhale 2 puffs into the lungs 2 (two) times daily. 02/16/23   Leroy Sea, MD  pantoprazole (PROTONIX) 40 MG tablet Take 1 tablet (40 mg total) by mouth daily. 02/17/23   Leroy Sea, MD  QUEtiapine (SEROQUEL) 50 MG tablet Take 1 tablet (50 mg total) by mouth 2 (two) times daily. 02/16/23   Leroy Sea, MD  rosuvastatin (CRESTOR) 10 MG tablet Take 1 tablet (10 mg total) by mouth daily. 03/06/23   Long, Arlyss Repress, MD  thiamine (VITAMIN B-1) 100 MG tablet Take 1 tablet (100 mg total) by mouth daily. 02/17/23   Leroy Sea, MD      Allergies    Other and Poison ivy extract    Review of Systems   Review of Systems  All other systems reviewed and are negative.   Physical Exam Updated Vital Signs BP 120/77 (BP Location: Left Arm)   Pulse 97   Temp 98.4 F (36.9 C) (Oral)   Resp 19   SpO2 99%  Physical Exam Vitals and nursing note reviewed.  Constitutional:      General: He is not in acute distress.    Appearance: He is well-developed.  HENT:     Head: Normocephalic and atraumatic.  Eyes:  Conjunctiva/sclera: Conjunctivae normal.  Cardiovascular:     Rate and Rhythm: Normal rate and regular rhythm.     Heart sounds: No murmur heard. Pulmonary:     Effort: Pulmonary effort is normal. No respiratory distress.     Breath sounds: Normal breath sounds.  Abdominal:     Palpations: Abdomen is soft.     Tenderness: There is no abdominal tenderness.  Musculoskeletal:        General: No swelling.  Skin:    General: Skin is warm and dry.     Capillary Refill: Capillary refill takes less than 2 seconds.  Neurological:     Mental Status: He is alert.  Psychiatric:        Mood and Affect: Mood normal.     ED Results / Procedures / Treatments   Labs (all labs ordered are listed, but only abnormal results are displayed) Labs Reviewed  CBC WITH DIFFERENTIAL/PLATELET - Abnormal;  Notable for the following components:      Result Value   RBC 3.00 (*)    Hemoglobin 9.9 (*)    HCT 30.6 (*)    MCV 102.0 (*)    RDW 18.2 (*)    All other components within normal limits  BASIC METABOLIC PANEL - Abnormal; Notable for the following components:   Sodium 133 (*)    CO2 21 (*)    All other components within normal limits  RESP PANEL BY RT-PCR (RSV, FLU A&B, COVID)  RVPGX2  ETHANOL  TROPONIN I (HIGH SENSITIVITY)  TROPONIN I (HIGH SENSITIVITY)    EKG EKG Interpretation Date/Time:  Monday March 08 2023 07:49:46 EDT Ventricular Rate:  97 PR Interval:    QRS Duration:  93 QT Interval:  400 QTC Calculation: 509 R Axis:   100  Text Interpretation: Atrial fibrillation Right axis deviation Nonspecific T abnrm, anterolateral leads Prolonged QT interval No significant change since last tracing Confirmed by Jacalyn Lefevre [phone]) on 03/08/2023 8:26:37 AM  Radiology DG Chest Port 1 View  Result Date: 03/08/2023 CLINICAL DATA:  Shortness of breath.  Generalized body aches EXAM: PORTABLE CHEST 1 VIEW COMPARISON:  03/06/2023 FINDINGS: Chronic cardiomegaly. Aortic and hilar contours are unremarkable. No edema, effusion, or pneumothorax. Postoperative left upper quadrant. Medial and distal left clavicle fractures which may be recent. Remote nonunited distal right clavicle fracture. IMPRESSION: 1. Mild atelectasis at the left lung base. 2. Recent appearing left clavicle fracture. Electronically Signed   By: Tiburcio Pea M.D.   On: 03/08/2023 07:42    Procedures Procedures    Medications Ordered in ED Medications  apixaban (ELIQUIS) tablet 5 mg (5 mg Oral Given 03/08/23 1035)  metoprolol tartrate (LOPRESSOR) tablet 25 mg (25 mg Oral Given 03/08/23 1035)  midodrine (PROAMATINE) tablet 10 mg (10 mg Oral Given 03/08/23 1035)    ED Course/ Medical Decision Making/ A&P                                 Medical Decision Making Patient complains of fatigue.  He states he feels  like he needs to rest.  Patient reports experiencing some shortness of breath but he is not currently short of breath  Amount and/or Complexity of Data Reviewed External Data Reviewed: notes.    Details: Hospitalist notes reviewed.  Patient has a past medical history of atrial fibrillation Labs: ordered. Decision-making details documented in ED Course.    Details: Reviewed and interpreted troponin is negative x  2 Radiology: ordered and independent interpretation performed. Decision-making details documented in ED Course.    Details: Chest x-ray ordered and reviewed.  No acute cardiopulmonary changes.  Clavicle fracture as seen on previous x-rays. ECG/medicine tests: ordered and independent interpretation performed. Decision-making details documented in ED Course.    Details: EKG atrial fibrillation no acute changes  Risk Prescription drug management. Risk Details: Patient is able to eat and drink he ambulates to the bathroom and in the hall without any difficulty.  I discussed with patient his ability to care for himself.  He states that he feels like he is able to care for himself better than he has in the past.  Patient is currently homeless I discussed with him community services.  Patient is aware of the [facility].           Final Clinical Impression(s) / ED Diagnoses Final diagnoses:  Other fatigue    Rx / DC Orders ED Discharge Orders     None      .avs   Osie Cheeks 03/08/23 1126    Jacalyn Lefevre, MD 03/08/23 1208

## 2023-07-07 ENCOUNTER — Emergency Department (HOSPITAL_COMMUNITY): Payer: 59

## 2023-07-07 ENCOUNTER — Other Ambulatory Visit: Payer: Self-pay

## 2023-07-07 ENCOUNTER — Inpatient Hospital Stay (HOSPITAL_COMMUNITY)
Admission: EM | Admit: 2023-07-07 | Discharge: 2023-07-12 | DRG: 175 | Disposition: A | Discharge status: Home / Self Care | Payer: 59 | Attending: Family Medicine | Admitting: Family Medicine

## 2023-07-07 DIAGNOSIS — T447X6A Underdosing of beta-adrenoreceptor antagonists, initial encounter: Secondary | ICD-10-CM | POA: Diagnosis present

## 2023-07-07 DIAGNOSIS — Z86711 Personal history of pulmonary embolism: Secondary | ICD-10-CM

## 2023-07-07 DIAGNOSIS — Z79899 Other long term (current) drug therapy: Secondary | ICD-10-CM

## 2023-07-07 DIAGNOSIS — T458X6A Underdosing of other primarily systemic and hematological agents, initial encounter: Secondary | ICD-10-CM | POA: Diagnosis present

## 2023-07-07 DIAGNOSIS — T45516A Underdosing of anticoagulants, initial encounter: Secondary | ICD-10-CM | POA: Diagnosis present

## 2023-07-07 DIAGNOSIS — E872 Acidosis, unspecified: Secondary | ICD-10-CM | POA: Diagnosis present

## 2023-07-07 DIAGNOSIS — E871 Hypo-osmolality and hyponatremia: Secondary | ICD-10-CM | POA: Diagnosis present

## 2023-07-07 DIAGNOSIS — E162 Hypoglycemia, unspecified: Secondary | ICD-10-CM | POA: Diagnosis present

## 2023-07-07 DIAGNOSIS — F1020 Alcohol dependence, uncomplicated: Secondary | ICD-10-CM | POA: Diagnosis present

## 2023-07-07 DIAGNOSIS — F32A Depression, unspecified: Secondary | ICD-10-CM | POA: Diagnosis present

## 2023-07-07 DIAGNOSIS — I2699 Other pulmonary embolism without acute cor pulmonale: Principal | ICD-10-CM | POA: Diagnosis present

## 2023-07-07 DIAGNOSIS — Z86718 Personal history of other venous thrombosis and embolism: Secondary | ICD-10-CM

## 2023-07-07 DIAGNOSIS — J90 Pleural effusion, not elsewhere classified: Secondary | ICD-10-CM

## 2023-07-07 DIAGNOSIS — Z85828 Personal history of other malignant neoplasm of skin: Secondary | ICD-10-CM

## 2023-07-07 DIAGNOSIS — I4891 Unspecified atrial fibrillation: Secondary | ICD-10-CM

## 2023-07-07 DIAGNOSIS — T43596A Underdosing of other antipsychotics and neuroleptics, initial encounter: Secondary | ICD-10-CM | POA: Diagnosis present

## 2023-07-07 DIAGNOSIS — F411 Generalized anxiety disorder: Secondary | ICD-10-CM | POA: Diagnosis present

## 2023-07-07 DIAGNOSIS — Z7951 Long term (current) use of inhaled steroids: Secondary | ICD-10-CM

## 2023-07-07 DIAGNOSIS — G8929 Other chronic pain: Secondary | ICD-10-CM | POA: Diagnosis present

## 2023-07-07 DIAGNOSIS — E785 Hyperlipidemia, unspecified: Secondary | ICD-10-CM | POA: Diagnosis present

## 2023-07-07 DIAGNOSIS — I509 Heart failure, unspecified: Secondary | ICD-10-CM

## 2023-07-07 DIAGNOSIS — I480 Paroxysmal atrial fibrillation: Secondary | ICD-10-CM | POA: Diagnosis present

## 2023-07-07 DIAGNOSIS — Z87891 Personal history of nicotine dependence: Secondary | ICD-10-CM

## 2023-07-07 DIAGNOSIS — Z8249 Family history of ischemic heart disease and other diseases of the circulatory system: Secondary | ICD-10-CM

## 2023-07-07 DIAGNOSIS — I110 Hypertensive heart disease with heart failure: Secondary | ICD-10-CM | POA: Diagnosis present

## 2023-07-07 DIAGNOSIS — Z91138 Patient's unintentional underdosing of medication regimen for other reason: Secondary | ICD-10-CM

## 2023-07-07 DIAGNOSIS — R0602 Shortness of breath: Secondary | ICD-10-CM | POA: Diagnosis not present

## 2023-07-07 DIAGNOSIS — I10 Essential (primary) hypertension: Secondary | ICD-10-CM | POA: Diagnosis present

## 2023-07-07 DIAGNOSIS — T471X6A Underdosing of other antacids and anti-gastric-secretion drugs, initial encounter: Secondary | ICD-10-CM | POA: Diagnosis present

## 2023-07-07 DIAGNOSIS — I2724 Chronic thromboembolic pulmonary hypertension: Secondary | ICD-10-CM | POA: Diagnosis present

## 2023-07-07 DIAGNOSIS — Z8673 Personal history of transient ischemic attack (TIA), and cerebral infarction without residual deficits: Secondary | ICD-10-CM

## 2023-07-07 DIAGNOSIS — I5033 Acute on chronic diastolic (congestive) heart failure: Secondary | ICD-10-CM | POA: Diagnosis present

## 2023-07-07 DIAGNOSIS — T466X6A Underdosing of antihyperlipidemic and antiarteriosclerotic drugs, initial encounter: Secondary | ICD-10-CM | POA: Diagnosis present

## 2023-07-07 DIAGNOSIS — Z7901 Long term (current) use of anticoagulants: Secondary | ICD-10-CM

## 2023-07-07 DIAGNOSIS — F10229 Alcohol dependence with intoxication, unspecified: Secondary | ICD-10-CM | POA: Diagnosis present

## 2023-07-07 DIAGNOSIS — Z823 Family history of stroke: Secondary | ICD-10-CM

## 2023-07-07 DIAGNOSIS — I959 Hypotension, unspecified: Secondary | ICD-10-CM | POA: Diagnosis not present

## 2023-07-07 DIAGNOSIS — D509 Iron deficiency anemia, unspecified: Secondary | ICD-10-CM | POA: Diagnosis present

## 2023-07-07 DIAGNOSIS — Z5901 Sheltered homelessness: Secondary | ICD-10-CM

## 2023-07-07 DIAGNOSIS — I5082 Biventricular heart failure: Secondary | ICD-10-CM | POA: Diagnosis present

## 2023-07-07 LAB — CBC WITH DIFFERENTIAL/PLATELET
Abs Immature Granulocytes: 0.01 10*3/uL (ref 0.00–0.07)
Basophils Absolute: 0.1 10*3/uL (ref 0.0–0.1)
Basophils Relative: 2 %
Eosinophils Absolute: 0.1 10*3/uL (ref 0.0–0.5)
Eosinophils Relative: 2 %
HCT: 31.4 % — ABNORMAL LOW (ref 39.0–52.0)
Hemoglobin: 10 g/dL — ABNORMAL LOW (ref 13.0–17.0)
Immature Granulocytes: 0 %
Lymphocytes Relative: 39 %
Lymphs Abs: 1.6 10*3/uL (ref 0.7–4.0)
MCH: 31.6 pg (ref 26.0–34.0)
MCHC: 31.8 g/dL (ref 30.0–36.0)
MCV: 99.4 fL (ref 80.0–100.0)
Monocytes Absolute: 0.6 10*3/uL (ref 0.1–1.0)
Monocytes Relative: 14 %
Neutro Abs: 1.8 10*3/uL (ref 1.7–7.7)
Neutrophils Relative %: 43 %
Platelets: 190 10*3/uL (ref 150–400)
RBC: 3.16 MIL/uL — ABNORMAL LOW (ref 4.22–5.81)
RDW: 20.9 % — ABNORMAL HIGH (ref 11.5–15.5)
WBC: 4.1 10*3/uL (ref 4.0–10.5)
nRBC: 0 % (ref 0.0–0.2)

## 2023-07-07 LAB — I-STAT CG4 LACTIC ACID, ED
Lactic Acid, Venous: 2.7 mmol/L (ref 0.5–1.9)
Lactic Acid, Venous: 2 mmol/L (ref 0.5–1.9)

## 2023-07-07 LAB — BASIC METABOLIC PANEL
Anion gap: 16 — ABNORMAL HIGH (ref 5–15)
BUN: 13 mg/dL (ref 8–23)
CO2: 15 mmol/L — ABNORMAL LOW (ref 22–32)
Calcium: 8.7 mg/dL — ABNORMAL LOW (ref 8.9–10.3)
Chloride: 101 mmol/L (ref 98–111)
Creatinine, Ser: 0.8 mg/dL (ref 0.61–1.24)
GFR, Estimated: >60 mL/min (ref >60)
Glucose, Bld: 69 mg/dL — ABNORMAL LOW (ref 70–99)
Potassium: 4.5 mmol/L (ref 3.5–5.1)
Sodium: 132 mmol/L — ABNORMAL LOW (ref 135–145)

## 2023-07-07 LAB — ETHANOL: Alcohol, Ethyl (B): <10 mg/dL (ref <10)

## 2023-07-07 LAB — MAGNESIUM: Magnesium: 1.3 mg/dL — ABNORMAL LOW (ref 1.7–2.4)

## 2023-07-07 LAB — BRAIN NATRIURETIC PEPTIDE: B Natriuretic Peptide: 553.5 pg/mL — ABNORMAL HIGH (ref 0.0–100.0)

## 2023-07-07 LAB — TROPONIN I (HIGH SENSITIVITY)
Troponin I (High Sensitivity): 14 ng/L (ref <18)
Troponin I (High Sensitivity): 11 ng/L (ref <18)

## 2023-07-07 MED ORDER — DILTIAZEM LOAD VIA INFUSION
15.0000 mg | Freq: Once | INTRAVENOUS | Status: AC
  Administered 2023-07-07: 15 mg via INTRAVENOUS
  Filled 2023-07-07: qty 15

## 2023-07-07 MED ORDER — IOHEXOL 350 MG/ML SOLN
75.0000 mL | Freq: Once | INTRAVENOUS | Status: AC | PRN
  Administered 2023-07-07: 75 mL via INTRAVENOUS

## 2023-07-07 MED ORDER — HEPARIN BOLUS VIA INFUSION
4500.0000 [IU] | Freq: Once | INTRAVENOUS | Status: AC
  Administered 2023-07-07: 4500 [IU] via INTRAVENOUS
  Filled 2023-07-07: qty 4500

## 2023-07-07 MED ORDER — FUROSEMIDE 10 MG/ML IJ SOLN
40.0000 mg | Freq: Once | INTRAMUSCULAR | Status: AC
  Administered 2023-07-07: 40 mg via INTRAVENOUS
  Filled 2023-07-07: qty 4

## 2023-07-07 MED ORDER — DILTIAZEM HCL-DEXTROSE 125-5 MG/125ML-% IV SOLN (PREMIX)
5.0000 mg/h | INTRAVENOUS | Status: DC
  Administered 2023-07-07: 5 mg/h via INTRAVENOUS
  Filled 2023-07-07: qty 125

## 2023-07-07 MED ORDER — HEPARIN (PORCINE) 25000 UT/250ML-% IV SOLN
1450.0000 [IU]/h | INTRAVENOUS | Status: DC
  Administered 2023-07-07: 1000 [IU]/h via INTRAVENOUS
  Administered 2023-07-08: 1300 [IU]/h via INTRAVENOUS
  Filled 2023-07-07 (×2): qty 250

## 2023-07-07 NOTE — ED Provider Notes (Signed)
EMERGENCY DEPARTMENT AT [facility] Provider Note   CSN: [id number] Arrival date & time: 07/07/23  1948     History  Chief Complaint  Patient presents with   Leg Swelling    Anthony A Lothrop Montez Hageman. is a 67 y.o. male history of EtOH use, hypertension, PE, A-fib, CHF here for evaluation shortness of breath and lower extremity edema.  Patient states Anthony [name] has had lower extremity edema which has been worsening over the last year.  Anthony [name] states Anthony [name] was previously on a "fluid pill" however due to homelessness Anthony [name] has not been able to take his medications.  Anthony [name] states Anthony [name] was previously on a blood thinner for his prior blood clots and A-fib however again has not been able to take this.  Anthony [name] states his swelling and shortness of breath worsened over the last week or 2.  Shortness of breath worse with exertion, sometimes states Anthony [name] feels like his "heart is racing."  States Anthony [name] typically does drink alcohol.  Last drink today.  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  States Anthony [name] has had very Lazarus appetite.  Denies any recent falls or injuries.  No fever, back pain, abdominal pain, nausea or vomiting.  HPI     Home Medications Prior to Admission medications   Medication Sig Start Date End Date Taking? Authorizing Provider  acetaminophen (TYLENOL) 500 MG tablet Take 1 tablet (500 mg total) by mouth every 8 (eight) hours as needed. Patient taking differently: Take 1,000 mg by mouth every 6 (six) hours as needed (for pain). 10/13/22   Leatha Gilding, MD  amiodarone (PACERONE) 200 MG tablet Take 1 tablet (200 mg total) by mouth daily. 12/27/22   Regalado, Belkys A, MD  apixaban (ELIQUIS) 5 MG TABS tablet Take 1 tablet (5 mg total) by mouth 2 (two) times daily. 03/06/23 04/05/23  Long, Arlyss Repress, MD  ferrous sulfate 325 (65 FE) MG tablet Take 1 tablet (325 mg total) by mouth daily with breakfast. 12/18/22 01/17/23  Regalado, Jon Billings A, MD  folic acid (FOLVITE) 1 MG  tablet Take 1 tablet (1 mg total) by mouth daily. 02/16/23   Leroy Sea, MD  magnesium oxide (MAG-OX) 400 (240 Mg) MG tablet Take 1 tablet (400 mg total) by mouth 2 (two) times daily. 02/16/23   Leroy Sea, MD  metoprolol tartrate (LOPRESSOR) 25 MG tablet Take 1 tablet (25 mg total) by mouth 2 (two) times daily. 03/06/23 04/05/23  Long, Arlyss Repress, MD  mometasone-formoterol (DULERA) 200-5 MCG/ACT AERO Inhale 2 puffs into the lungs 2 (two) times daily. 02/16/23   Leroy Sea, MD  pantoprazole (PROTONIX) 40 MG tablet Take 1 tablet (40 mg total) by mouth daily. 02/17/23   Leroy Sea, MD  QUEtiapine (SEROQUEL) 50 MG tablet Take 1 tablet (50 mg total) by mouth 2 (two) times daily. 02/16/23   Leroy Sea, MD  rosuvastatin (CRESTOR) 10 MG tablet Take 1 tablet (10 mg total) by mouth daily. Patient not taking: Reported on 07/07/2023 03/06/23   Long, Arlyss Repress, MD  thiamine (VITAMIN B-1) 100 MG tablet Take 1 tablet (100 mg total) by mouth daily. Patient not taking: Reported on 07/07/2023 02/17/23   Leroy Sea, MD      Allergies    Other and Poison ivy extract    Review of Systems   Review of Systems  Constitutional:  Positive for appetite change.  HENT: Negative.  Respiratory:  Positive for cough and shortness of breath.   Cardiovascular:  Positive for chest pain, palpitations and leg swelling.  Gastrointestinal: Negative.   Genitourinary: Negative.   Musculoskeletal: Negative.   Skin: Negative.   All other systems reviewed and are negative.   Physical Exam Updated Vital Signs BP (!) 140/106   Pulse 91   Temp 98.3 F (36.8 C) (Oral)   Resp 18   Ht 5\' 8"  (1.727 m)   Wt 63.5 kg   SpO2 100%   BMI 21.29 kg/m  Physical Exam Vitals and nursing note reviewed.  Constitutional:      General: Anthony [name] is not in acute distress.    Appearance: Anthony [name] is well-developed. Anthony [name] is not ill-appearing, toxic-appearing or diaphoretic.  HENT:     Head: Normocephalic and atraumatic.      Nose: Nose normal.     Mouth/Throat:     Mouth: Mucous membranes are moist.  Eyes:     Pupils: Pupils are equal, round, and reactive to light.  Cardiovascular:     Rate and Rhythm: Tachycardia present. Rhythm irregular.     Pulses: Normal pulses.     Heart sounds: Normal heart sounds.     Comments: A-fib with RVR, heart rates into the 130s Pulmonary:     Effort: Pulmonary effort is normal. No respiratory distress.     Breath sounds: Rales present.     Comments: Mild rales lower lobes speaks in full sentences without difficulty Abdominal:     General: There is no distension.     Palpations: Abdomen is soft.     Tenderness: There is no abdominal tenderness. There is no right CVA tenderness, left CVA tenderness, guarding or rebound.  Musculoskeletal:        General: No swelling, tenderness, deformity or signs of injury. Normal range of motion.     Cervical back: Normal range of motion and neck supple.     Right lower leg: Edema present.     Left lower leg: Edema present.     Comments: Pitting edema bilateral legs, 3+ knees no overlying erythema or warmth  Skin:    General: Skin is warm and dry.     Capillary Refill: Capillary refill takes less than 2 seconds.     Comments: No obvious rashes or lesions to exposed skin.  Neurological:     General: No focal deficit present.     Mental Status: Anthony [name] is alert and oriented to person, place, and time.     Cranial Nerves: No cranial nerve deficit.     Motor: No weakness.    ED Results / Procedures / Treatments   Labs (all labs ordered are listed, but only abnormal results are displayed) Labs Reviewed  CBC WITH DIFFERENTIAL/PLATELET - Abnormal; Notable for the following components:      Result Value   RBC 3.16 (*)    Hemoglobin 10.0 (*)    HCT 31.4 (*)    RDW 20.9 (*)    All other components within normal limits  BASIC METABOLIC PANEL - Abnormal; Notable for the following components:   Sodium 132 (*)    CO2 15 (*)    Glucose,  Bld 69 (*)    Calcium 8.7 (*)    Anion gap 16 (*)    All other components within normal limits  BRAIN NATRIURETIC PEPTIDE - Abnormal; Notable for the following components:   B Natriuretic Peptide 553.5 (*)    All other components within normal limits  MAGNESIUM -  Abnormal; Notable for the following components:   Magnesium 1.3 (*)    All other components within normal limits  I-STAT CG4 LACTIC ACID, ED - Abnormal; Notable for the following components:   Lactic Acid, Venous 2.7 (*)    All other components within normal limits  I-STAT CG4 LACTIC ACID, ED - Abnormal; Notable for the following components:   Lactic Acid, Venous 2.0 (*)    All other components within normal limits  CULTURE, BLOOD (ROUTINE X 2)  CULTURE, BLOOD (ROUTINE X 2)  ETHANOL  HEPARIN LEVEL (UNFRACTIONATED)  CBC  TROPONIN I (HIGH SENSITIVITY)  TROPONIN I (HIGH SENSITIVITY)    EKG None  Radiology CT Angio Chest PE W and/or Wo Contrast Result Date: 07/07/2023 CLINICAL DATA:  High probability pulmonary embolism, tachycardia, leg swelling EXAM: CT ANGIOGRAPHY CHEST WITH CONTRAST TECHNIQUE: Multidetector CT imaging of the chest was performed using the standard protocol during bolus administration of intravenous contrast. Multiplanar CT image reconstructions and MIPs were obtained to evaluate the vascular anatomy. RADIATION DOSE REDUCTION: This exam was performed according to the departmental dose-optimization program which includes automated exposure control, adjustment of the mA and/or kV according to patient size and/or use of iterative reconstruction technique. CONTRAST:  75mL OMNIPAQUE IOHEXOL 350 MG/ML SOLN COMPARISON:  None Available. FINDINGS: Cardiovascular: There is adequate opacification of the pulmonary arterial tree. In intraluminal filling defect is identified within the right lower lobar pulmonary artery in keeping with an acute pulmonary embolus. The embolic burden is moderate. The central pulmonary arteries  are enlarged in keeping with changes of pulmonary arterial hypertension. There is reversal of normal RV/LV ratio, (RV/LV = 1.2), however, this is stable since prior examination 03/06/2023 and there is therefore equivocal evidence for progressive right heart strain. There is mild cardiomegaly with dilation of the right heart chambers and reflux of contrast into the hepatic venous system in keeping elevated right heart pressure and some degree of right heart failure. Moderate coronary artery calcification. No pericardial effusion. Thoracic aorta is of normal caliber. Minimal atherosclerotic calcification within the descending thoracic aorta. Mediastinum/Nodes: Shotty right hilar and mediastinal adenopathy may be reactive in nature. No frankly pathologic adenopathy within the abdomen and pelvis. Esophagus unremarkable. Visualized thyroid is unremarkable. Lungs/Pleura: Moderate right pleural effusion. Compressive atelectasis of the right lower lobe. Subpleural ground-glass pulmonary infiltrate within the left upper lobe, axial image # 50/5 is nonspecific, possibly infectious or inflammatory in the acute setting. No pneumothorax. No pleural effusion on the left. Small layering debris within the right bronchus intermedius. Upper Abdomen: No acute abnormality. Musculoskeletal: Osseous structures are age-appropriate. No acute bone abnormality. Multiple healed bilateral rib fractures are again identified. Review of the MIP images confirms the above findings. IMPRESSION: 1. Acute pulmonary embolus with moderate embolic burden. Stable relative dilation of the right heart chambers and equivocal evidence for progressive right heart strain as described above. 2. Mild cardiomegaly with dilation of the right heart chambers and reflux of contrast into the hepatic venous system in keeping with elevated right heart pressure and some degree of right heart failure. 3. Moderate coronary artery calcification. 4. Moderate right pleural  effusion with compressive atelectasis of the right lower lobe. 5. Subpleural ground-glass pulmonary infiltrate within the left upper lobe, nonspecific, possibly infectious or inflammatory in the acute setting. 6. Shotty right hilar and mediastinal adenopathy, likely reactive in nature. Aortic Atherosclerosis (ICD10-I70.0). Electronically Signed   By: Helyn Numbers M.D.   On: 07/07/2023 22:25   DG Chest 2 View Result Date: 07/07/2023 CLINICAL  DATA:  Dyspnea EXAM: CHEST - 2 VIEW COMPARISON:  03/08/2023 FINDINGS: Left costophrenic angle is partially excluded. Small right pleural effusion. There is progressive central pulmonary vascular congestion without overt pulmonary edema. No pneumothorax. Mild left basilar atelectasis. Cardiac size within normal limits. No acute bone abnormality. IMPRESSION: 1. Progressive central pulmonary vascular congestion without overt pulmonary edema. 2. New small right pleural effusion. Electronically Signed   By: Helyn Numbers M.D.   On: 07/07/2023 22:14    Procedures .Critical Care  Performed by: Linwood Dibbles, PA-C Authorized by: Linwood Dibbles, PA-C   Critical care provider statement:    Critical care time (minutes):  35   Critical care was necessary to treat or prevent imminent or life-threatening deterioration of the following conditions:  Cardiac failure, circulatory failure and respiratory failure   Critical care was time spent personally by me on the following activities:  Development of treatment plan with patient or surrogate, discussions with consultants, evaluation of patient's response to treatment, examination of patient, ordering and review of laboratory studies, ordering and review of radiographic studies, ordering and performing treatments and interventions, pulse oximetry, re-evaluation of patient's condition and review of old charts     Medications Ordered in ED Medications  diltiazem (CARDIZEM) 1 mg/mL load via infusion 15 mg (15 mg  Intravenous Bolus from Bag 07/07/23 2208)    And  diltiazem (CARDIZEM) 125 mg in dextrose 5% 125 mL (1 mg/mL) infusion (5 mg/hr Intravenous New Bag/Given 07/07/23 2208)  heparin ADULT infusion 100 units/mL (25000 units/23mL) (1,000 Units/hr Intravenous New Bag/Given 07/07/23 2301)  iohexol (OMNIPAQUE) 350 MG/ML injection 75 mL (75 mLs Intravenous Contrast Given 07/07/23 2204)  furosemide (LASIX) injection 40 mg (40 mg Intravenous Given 07/07/23 2230)  heparin bolus via infusion 4,500 Units (4,500 Units Intravenous Bolus from Bag 07/07/23 2301)   ED Course/ Medical Decision Making/ A&P   67 year old here for evaluation of multiple complaints.  Unfortunately patient is on home and has difficulty with medical access therefore does not take medications in the outpatient setting.  Anthony [name] has an extensive past medical history.  On exam Anthony [name] arrived in A-fib with RVR.  Has some mild rales at his lower lobes however no acute respiratory distress.  Anthony [name] has 3+ pitting edema to knees bilaterally.  Anthony [name] has no evidence of acute cellulitis.  Anthony [name] is neurovascularly intact.  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  Anthony [name] does not appear to be in active withdrawal at this time.  Will plan on labs, imaging and reassess  Labs and imaging personally viewed and interpreted:  EKG A-fib with RVR Chest x-ray CBC without leukocytosis, hemoglobin 10--similar to prior BNP 553 Troponin 14 Metabolic panel sodium 132, glucose 69  Discussed with radiology, patient has PE, unable to determine right heart strain as Anthony [name] has had elevated right heart pressures on prior imaging which today remain unchanged.  Also has effusion on right.  Discussed results with patient.  Anthony [name] was started on heparin.  Anthony [name] is hemodynamically stable, denies any current complaints.  Will admit to medicine  CONSULT Dr. Loney Loh with medicine, rec bedside ECHO  Attending Dr. Jearld Fenton bedside ECHO, seems similar to prior. Will need formal  ECHO while inpatient.  Secure messaged Dr. Loney Loh with admitting team. Anthony [name] will be stable for admission to the medicine team.  The patient appears reasonably stabilized for admission considering the current resources, flow, and capabilities available in the ED at this time, and I doubt any  other [facility] requiring further screening and/or treatment in the ED prior to admission.                                Medical Decision Making Amount and/or Complexity of Data Reviewed Independent Historian: EMS External Data Reviewed: labs, radiology, ECG and notes. Labs: ordered. Decision-making details documented in ED Course. Radiology: ordered and independent interpretation performed. Decision-making details documented in ED Course. ECG/medicine tests: ordered and independent interpretation performed. Decision-making details documented in ED Course.  Risk OTC drugs. Prescription drug management. Parenteral controlled substances. Decision regarding hospitalization. Diagnosis or treatment significantly limited by social determinants of health.         Final Clinical Impression(s) / ED Diagnoses Final diagnoses:  Atrial fibrillation with RVR (HCC)  Acute on chronic congestive heart failure, unspecified heart failure type (HCC)  Other acute pulmonary embolism without acute cor pulmonale (HCC)  Pleural effusion    Rx / DC Orders ED Discharge Orders          Ordered    Amb referral to [facility] Clinic        07/07/23 2038              Tamyah Cutbirth A, PA-C 07/07/23 2356    Loetta Rough, MD 07/09/23 [phone]

## 2023-07-07 NOTE — Progress Notes (Signed)
PHARMACY - ANTICOAGULATION CONSULT NOTE  Pharmacy Consult for IV heparin  Indication: pulmonary embolus  Allergies  Allergen Reactions   Other Itching and Other (See Comments)    Seasonal allergies- Itchy eyes, runny nose, congestion   Poison Ivy Extract Rash    Patient Measurements: Height: 5\' 8"  (172.7 cm) Weight: 63.5 kg (140 lb) IBW/kg (Calculated) : 68.4 Heparin Dosing Weight: 63.5 kg  Vital Signs: Temp: 97.7 F (36.5 C) (12/18 1938) Temp Source: Oral (12/18 1938) BP: 136/103 (12/18 2045) Pulse Rate: 109 (12/18 2045)  Labs: Recent Labs    07/07/23 1955  HGB 10.0*  HCT 31.4*  PLT 190  CREATININE 0.80  TROPONINIHS 14    Estimated Creatinine Clearance: 80.5 mL/min (by C-G formula based on SCr of 0.8 mg/dL).   Medical History: Past Medical History:  Diagnosis Date   Actinic keratosis 11/27/2016   Alcohol abuse with alcohol-induced mood disorder (HCC) 07/18/2018   Alcohol use disorder, severe, dependence (HCC) 09/16/2006   05/22/2015 Argumentative, belligerent and verbally abusive to staff per his note from [place]    Alcoholism [facility])    Anxiety state 04/25/2018   Aortic atherosclerosis (HCC) 08/27/2022   Chronic low back pain 09/16/2006   Followed by NS.  Per his chart from [place], he fell off two storyhouse roof while cleaning his brother's gutters and sustained compression fracture of L3, L4 and L5 and fracture of right transverse process at L3 and nondisplaced fracture of left glenoid rim many years ago. He had multiple imaging in [place] including Lumbar MRI in 2016 which showed moderate to severe spinal canal stenos   Delirium tremens (HCC) 09/01/2017   Depression    Dry eye 11/23/2016   Foreign body in middle portion of esophagus 05/19/2022   Hiatal hernia 09/14/2016   Status Collis gastroplasty and Nissen's fundoplication on 04/29/2015 in [place]   History of delirium tremons 07/22/2020   DTs during 10/2016 08/2017. And 12/2017  admissions    History of pulmonary embolus (PE) 11/02/2016   Unprovoked 11/01/2016. Xarelto from 11/01/16 to 09/08/2017.   Hydrocele    Surgically corrected   Hypertension    Hypoalbuminemia due to protein-calorie malnutrition (HCC) 07/22/2020   Lumbar compression fracture (HCC)    Multiple rib fractures 07/22/2019   Left rib details XR: left ribs demonstrate multiple remote rib   Pancytopenia (HCC)    Rhabdomyolysis 07/22/2020   Skin cancer    exciced 2017   Tinea versicolor 06/08/2013   Tobacco use disorder 07/22/2020   Tooth infection 04/25/2018   Trigger finger, acquired 11/18/2012   Ulnar tunnel syndrome of right wrist 11/08/2012    Assessment: Anthony A Charland Montez Hageman. is a 67 y.o. year old male admitted on 07/07/2023 with concern for PE. CTA with acute PE with moderate embolic burden. No anticoagulation prior to admission. Prescribed eliquis for history of PE and afib with previous stroke. Pharmacy consulted to dose heparin.  Goal of Therapy:  Heparin level 0.3-0.7 units/ml Monitor platelets by anticoagulation protocol: Yes   Plan:  Heparin 4500 units x 1 as bolus followed by heparin infusion at 1000 units/hr 6 heparin level  Daily heparin level, CBC, and monitoring for bleeding F/u plans for anticoagulation   Thank you for allowing pharmacy to participate in this patient's care.  Marja Kays, PharmD Emergency Medicine Clinical Pharmacist 07/07/2023,10:50 PM

## 2023-07-07 NOTE — ED Triage Notes (Signed)
Patient bib [facility]cems for bilateral leg swelling ongoing for 2 weeks. Hx of chf, afib. Not compliant with medications. Denies chest pain or shob. EMS reports tachycardia in the 140's when ambulating and 120's at rest. Etoh on board.

## 2023-07-07 NOTE — ED Notes (Signed)
Patient transported to CT 

## 2023-07-08 ENCOUNTER — Encounter (HOSPITAL_COMMUNITY): Payer: Self-pay | Admitting: Internal Medicine

## 2023-07-08 ENCOUNTER — Inpatient Hospital Stay (HOSPITAL_COMMUNITY): Payer: 59

## 2023-07-08 DIAGNOSIS — I2609 Other pulmonary embolism with acute cor pulmonale: Secondary | ICD-10-CM

## 2023-07-08 DIAGNOSIS — Z5900 Homelessness unspecified: Secondary | ICD-10-CM | POA: Diagnosis not present

## 2023-07-08 DIAGNOSIS — Z85828 Personal history of other malignant neoplasm of skin: Secondary | ICD-10-CM | POA: Diagnosis not present

## 2023-07-08 DIAGNOSIS — I959 Hypotension, unspecified: Secondary | ICD-10-CM | POA: Diagnosis not present

## 2023-07-08 DIAGNOSIS — I2699 Other pulmonary embolism without acute cor pulmonale: Secondary | ICD-10-CM | POA: Diagnosis present

## 2023-07-08 DIAGNOSIS — R0602 Shortness of breath: Secondary | ICD-10-CM | POA: Diagnosis present

## 2023-07-08 DIAGNOSIS — E872 Acidosis, unspecified: Secondary | ICD-10-CM | POA: Insufficient documentation

## 2023-07-08 DIAGNOSIS — W19XXXA Unspecified fall, initial encounter: Secondary | ICD-10-CM | POA: Diagnosis not present

## 2023-07-08 DIAGNOSIS — F10229 Alcohol dependence with intoxication, unspecified: Secondary | ICD-10-CM | POA: Diagnosis present

## 2023-07-08 DIAGNOSIS — Z8673 Personal history of transient ischemic attack (TIA), and cerebral infarction without residual deficits: Secondary | ICD-10-CM | POA: Diagnosis not present

## 2023-07-08 DIAGNOSIS — Z823 Family history of stroke: Secondary | ICD-10-CM | POA: Diagnosis not present

## 2023-07-08 DIAGNOSIS — J984 Other disorders of lung: Secondary | ICD-10-CM | POA: Diagnosis not present

## 2023-07-08 DIAGNOSIS — E871 Hypo-osmolality and hyponatremia: Secondary | ICD-10-CM | POA: Diagnosis present

## 2023-07-08 DIAGNOSIS — Z9181 History of falling: Secondary | ICD-10-CM | POA: Diagnosis not present

## 2023-07-08 DIAGNOSIS — I5042 Chronic combined systolic (congestive) and diastolic (congestive) heart failure: Secondary | ICD-10-CM | POA: Diagnosis not present

## 2023-07-08 DIAGNOSIS — E162 Hypoglycemia, unspecified: Secondary | ICD-10-CM | POA: Diagnosis present

## 2023-07-08 DIAGNOSIS — M79661 Pain in right lower leg: Secondary | ICD-10-CM | POA: Diagnosis not present

## 2023-07-08 DIAGNOSIS — R2689 Other abnormalities of gait and mobility: Secondary | ICD-10-CM | POA: Diagnosis not present

## 2023-07-08 DIAGNOSIS — R600 Localized edema: Secondary | ICD-10-CM | POA: Diagnosis not present

## 2023-07-08 DIAGNOSIS — Z5901 Sheltered homelessness: Secondary | ICD-10-CM | POA: Diagnosis not present

## 2023-07-08 DIAGNOSIS — D509 Iron deficiency anemia, unspecified: Secondary | ICD-10-CM | POA: Diagnosis present

## 2023-07-08 DIAGNOSIS — Z8249 Family history of ischemic heart disease and other diseases of the circulatory system: Secondary | ICD-10-CM | POA: Diagnosis not present

## 2023-07-08 DIAGNOSIS — F32A Depression, unspecified: Secondary | ICD-10-CM | POA: Diagnosis present

## 2023-07-08 DIAGNOSIS — E785 Hyperlipidemia, unspecified: Secondary | ICD-10-CM | POA: Diagnosis present

## 2023-07-08 DIAGNOSIS — Z79899 Other long term (current) drug therapy: Secondary | ICD-10-CM | POA: Diagnosis not present

## 2023-07-08 DIAGNOSIS — F1020 Alcohol dependence, uncomplicated: Secondary | ICD-10-CM | POA: Diagnosis not present

## 2023-07-08 DIAGNOSIS — I5082 Biventricular heart failure: Secondary | ICD-10-CM | POA: Diagnosis present

## 2023-07-08 DIAGNOSIS — J90 Pleural effusion, not elsewhere classified: Secondary | ICD-10-CM | POA: Diagnosis not present

## 2023-07-08 DIAGNOSIS — I480 Paroxysmal atrial fibrillation: Secondary | ICD-10-CM | POA: Diagnosis present

## 2023-07-08 DIAGNOSIS — Z7901 Long term (current) use of anticoagulants: Secondary | ICD-10-CM | POA: Diagnosis not present

## 2023-07-08 DIAGNOSIS — I5033 Acute on chronic diastolic (congestive) heart failure: Secondary | ICD-10-CM | POA: Diagnosis present

## 2023-07-08 DIAGNOSIS — R4182 Altered mental status, unspecified: Secondary | ICD-10-CM | POA: Diagnosis present

## 2023-07-08 DIAGNOSIS — I4891 Unspecified atrial fibrillation: Secondary | ICD-10-CM | POA: Diagnosis not present

## 2023-07-08 DIAGNOSIS — R41841 Cognitive communication deficit: Secondary | ICD-10-CM | POA: Diagnosis not present

## 2023-07-08 DIAGNOSIS — I110 Hypertensive heart disease with heart failure: Secondary | ICD-10-CM | POA: Diagnosis present

## 2023-07-08 DIAGNOSIS — R0600 Dyspnea, unspecified: Secondary | ICD-10-CM | POA: Diagnosis not present

## 2023-07-08 DIAGNOSIS — Z86711 Personal history of pulmonary embolism: Secondary | ICD-10-CM | POA: Diagnosis not present

## 2023-07-08 DIAGNOSIS — Z7951 Long term (current) use of inhaled steroids: Secondary | ICD-10-CM | POA: Diagnosis not present

## 2023-07-08 DIAGNOSIS — I2724 Chronic thromboembolic pulmonary hypertension: Secondary | ICD-10-CM | POA: Diagnosis present

## 2023-07-08 DIAGNOSIS — Z87891 Personal history of nicotine dependence: Secondary | ICD-10-CM | POA: Diagnosis not present

## 2023-07-08 LAB — URINALYSIS, ROUTINE W REFLEX MICROSCOPIC
Bacteria, UA: NONE SEEN
Bilirubin Urine: NEGATIVE
Glucose, UA: NEGATIVE mg/dL
Hgb urine dipstick: NEGATIVE
Ketones, ur: NEGATIVE mg/dL
Leukocytes,Ua: NEGATIVE
Nitrite: NEGATIVE
Protein, ur: NEGATIVE mg/dL
Specific Gravity, Urine: 1.015 (ref 1.005–1.030)
pH: 6 (ref 5.0–8.0)

## 2023-07-08 LAB — COMPREHENSIVE METABOLIC PANEL
ALT: 20 U/L (ref 0–44)
AST: 47 U/L — ABNORMAL HIGH (ref 15–41)
Albumin: 3 g/dL — ABNORMAL LOW (ref 3.5–5.0)
Alkaline Phosphatase: 134 U/L — ABNORMAL HIGH (ref 38–126)
Anion gap: 10 (ref 5–15)
BUN: 10 mg/dL (ref 8–23)
CO2: 21 mmol/L — ABNORMAL LOW (ref 22–32)
Calcium: 8.2 mg/dL — ABNORMAL LOW (ref 8.9–10.3)
Chloride: 102 mmol/L (ref 98–111)
Creatinine, Ser: 0.78 mg/dL (ref 0.61–1.24)
GFR, Estimated: >60 mL/min (ref >60)
Glucose, Bld: 72 mg/dL (ref 70–99)
Potassium: 3.3 mmol/L — ABNORMAL LOW (ref 3.5–5.1)
Sodium: 133 mmol/L — ABNORMAL LOW (ref 135–145)
Total Bilirubin: 1.6 mg/dL — ABNORMAL HIGH (ref <1.2)
Total Protein: 6.5 g/dL (ref 6.5–8.1)

## 2023-07-08 LAB — ECHOCARDIOGRAM COMPLETE
Area-P 1/2: 3.56 cm2
Calc EF: 38.4 %
Height: 68 in
S' Lateral: 4.1 cm
Single Plane A2C EF: 19.6 %
Single Plane A4C EF: 49.1 %
Weight: 2240 [oz_av]

## 2023-07-08 LAB — GLUCOSE, CAPILLARY
Glucose-Capillary: 138 mg/dL — ABNORMAL HIGH (ref 70–99)
Glucose-Capillary: 128 mg/dL — ABNORMAL HIGH (ref 70–99)

## 2023-07-08 LAB — CBG MONITORING, ED
Glucose-Capillary: 78 mg/dL (ref 70–99)
Glucose-Capillary: 78 mg/dL (ref 70–99)

## 2023-07-08 LAB — HIV ANTIBODY (ROUTINE TESTING W REFLEX): HIV Screen 4th Generation wRfx: NONREACTIVE

## 2023-07-08 LAB — CBC
HCT: 28.4 % — ABNORMAL LOW (ref 39.0–52.0)
Hemoglobin: 9.4 g/dL — ABNORMAL LOW (ref 13.0–17.0)
MCH: 31.6 pg (ref 26.0–34.0)
MCHC: 33.1 g/dL (ref 30.0–36.0)
MCV: 95.6 fL (ref 80.0–100.0)
Platelets: 179 10*3/uL (ref 150–400)
RBC: 2.97 MIL/uL — ABNORMAL LOW (ref 4.22–5.81)
RDW: 20.4 % — ABNORMAL HIGH (ref 11.5–15.5)
WBC: 4 10*3/uL (ref 4.0–10.5)
nRBC: 0 % (ref 0.0–0.2)

## 2023-07-08 LAB — MAGNESIUM: Magnesium: 1.2 mg/dL — ABNORMAL LOW (ref 1.7–2.4)

## 2023-07-08 LAB — HEPARIN LEVEL (UNFRACTIONATED)
Heparin Unfractionated: 0.22 [IU]/mL — ABNORMAL LOW (ref 0.30–0.70)
Heparin Unfractionated: <0.1 [IU]/mL — ABNORMAL LOW (ref 0.30–0.70)
Heparin Unfractionated: 0.24 [IU]/mL — ABNORMAL LOW (ref 0.30–0.70)
Heparin Unfractionated: 0.22 [IU]/mL — ABNORMAL LOW (ref 0.30–0.70)

## 2023-07-08 LAB — PROCALCITONIN: Procalcitonin: <0.1 ng/mL

## 2023-07-08 MED ORDER — PNEUMOCOCCAL 20-VAL CONJ VACC 0.5 ML IM SUSY
0.5000 mL | PREFILLED_SYRINGE | INTRAMUSCULAR | Status: DC
  Filled 2023-07-08: qty 0.5

## 2023-07-08 MED ORDER — ROSUVASTATIN CALCIUM 5 MG PO TABS
10.0000 mg | ORAL_TABLET | Freq: Every day | ORAL | Status: DC
  Administered 2023-07-08 – 2023-07-11 (×4): 10 mg via ORAL
  Filled 2023-07-08 (×5): qty 2

## 2023-07-08 MED ORDER — ACETAMINOPHEN 650 MG RE SUPP: 650.0000 mg | Freq: Four times a day (QID) | RECTAL | Status: DC | PRN

## 2023-07-08 MED ORDER — ADULT MULTIVITAMIN W/MINERALS CH: 1.0000 | ORAL_TABLET | Freq: Every day | ORAL | Status: DC

## 2023-07-08 MED ORDER — METOPROLOL TARTRATE 25 MG PO TABS
25.0000 mg | ORAL_TABLET | Freq: Two times a day (BID) | ORAL | Status: DC
  Administered 2023-07-08 – 2023-07-10 (×5): 25 mg via ORAL
  Filled 2023-07-08 (×5): qty 1

## 2023-07-08 MED ORDER — LORAZEPAM 1 MG PO TABS
1.0000 mg | ORAL_TABLET | ORAL | Status: DC | PRN
Start: 2023-07-08 — End: 2023-07-11
  Administered 2023-07-09 (×2): 2 mg via ORAL
  Filled 2023-07-08 (×2): qty 2

## 2023-07-08 MED ORDER — CHLORDIAZEPOXIDE HCL 25 MG PO CAPS
25.0000 mg | ORAL_CAPSULE | ORAL | Status: AC
  Administered 2023-07-10 (×2): 25 mg via ORAL
  Filled 2023-07-08 (×2): qty 1

## 2023-07-08 MED ORDER — THIAMINE MONONITRATE 100 MG PO TABS
100.0000 mg | ORAL_TABLET | Freq: Every day | ORAL | Status: DC
  Administered 2023-07-08 – 2023-07-11 (×4): 100 mg via ORAL
  Filled 2023-07-08 (×4): qty 1

## 2023-07-08 MED ORDER — GLUCOSE 40 % PO GEL: 1.0000 | ORAL | Status: DC | PRN

## 2023-07-08 MED ORDER — CHLORDIAZEPOXIDE HCL 25 MG PO CAPS
25.0000 mg | ORAL_CAPSULE | Freq: Every day | ORAL | Status: AC
  Administered 2023-07-11: 25 mg via ORAL
  Filled 2023-07-08: qty 1

## 2023-07-08 MED ORDER — CHLORDIAZEPOXIDE HCL 25 MG PO CAPS
25.0000 mg | ORAL_CAPSULE | Freq: Three times a day (TID) | ORAL | Status: AC
  Administered 2023-07-09 (×3): 25 mg via ORAL
  Filled 2023-07-08 (×3): qty 1

## 2023-07-08 MED ORDER — HEPARIN BOLUS VIA INFUSION
1000.0000 [IU] | Freq: Once | INTRAVENOUS | Status: AC
  Administered 2023-07-08: 1000 [IU] via INTRAVENOUS
  Filled 2023-07-08: qty 1000

## 2023-07-08 MED ORDER — THIAMINE HCL 100 MG/ML IJ SOLN: 100.0000 mg | Freq: Every day | INTRAMUSCULAR | Status: DC

## 2023-07-08 MED ORDER — LORAZEPAM 2 MG/ML IJ SOLN: 1.0000 mg | INTRAMUSCULAR | Status: DC | PRN

## 2023-07-08 MED ORDER — FOLIC ACID 1 MG PO TABS
1.0000 mg | ORAL_TABLET | Freq: Every day | ORAL | Status: DC
  Administered 2023-07-08 – 2023-07-11 (×4): 1 mg via ORAL
  Filled 2023-07-08 (×5): qty 1

## 2023-07-08 MED ORDER — THIAMINE HCL 100 MG/ML IJ SOLN: 100.0000 mg | Freq: Once | INTRAMUSCULAR | Status: DC

## 2023-07-08 MED ORDER — ACETAMINOPHEN 325 MG PO TABS
650.0000 mg | ORAL_TABLET | Freq: Four times a day (QID) | ORAL | Status: DC | PRN
  Administered 2023-07-11: 650 mg via ORAL
  Filled 2023-07-08: qty 2

## 2023-07-08 MED ORDER — ADULT MULTIVITAMIN W/MINERALS CH
1.0000 | ORAL_TABLET | Freq: Every day | ORAL | Status: DC
  Administered 2023-07-08 – 2023-07-11 (×4): 1 via ORAL
  Filled 2023-07-08 (×5): qty 1

## 2023-07-08 MED ORDER — FUROSEMIDE 20 MG PO TABS: 40.0000 mg | ORAL_TABLET | Freq: Every day | ORAL | Status: DC

## 2023-07-08 MED ORDER — INFLUENZA VAC A&B SURF ANT ADJ 0.5 ML IM SUSY
0.5000 mL | PREFILLED_SYRINGE | INTRAMUSCULAR | Status: DC
Start: 2023-07-09 — End: 2023-07-12
  Filled 2023-07-08: qty 0.5

## 2023-07-08 MED ORDER — CHLORDIAZEPOXIDE HCL 25 MG PO CAPS
25.0000 mg | ORAL_CAPSULE | Freq: Four times a day (QID) | ORAL | Status: AC
  Administered 2023-07-08 (×4): 25 mg via ORAL
  Filled 2023-07-08 (×4): qty 1

## 2023-07-08 MED ORDER — FUROSEMIDE 10 MG/ML IJ SOLN
40.0000 mg | Freq: Every day | INTRAMUSCULAR | Status: DC
  Administered 2023-07-08: 40 mg via INTRAVENOUS
  Filled 2023-07-08: qty 4

## 2023-07-08 MED ORDER — MAGNESIUM SULFATE 4 GM/100ML IV SOLN
4.0000 g | Freq: Once | INTRAVENOUS | Status: AC
  Administered 2023-07-08: 4 g via INTRAVENOUS
  Filled 2023-07-08: qty 100

## 2023-07-08 NOTE — Progress Notes (Signed)
   07/08/23 1530  Assess: MEWS Score  BP (!) 83/62  MAP (mmHg) 70  Pulse Rate 68  ECG Heart Rate 66  Resp (!) 21  SpO2 95 %  Assess: MEWS Score  MEWS Temp 0  MEWS Systolic 1  MEWS Pulse 0  MEWS RR 1  MEWS LOC 0  MEWS Score 2  MEWS Score Color Yellow  Assess: if the MEWS score is Yellow or Red  Were vital signs accurate and taken at a resting state? Yes  Does the patient meet 2 or more of the SIRS criteria? No  MEWS guidelines implemented  Yes, yellow  Treat  MEWS Interventions Considered administering scheduled or prn medications/treatments as ordered  Take Vital Signs  Increase Vital Sign Frequency  Yellow: Q2hr x1, continue Q4hrs until patient remains green for 12hrs  Escalate  MEWS: Escalate Yellow: Discuss with charge nurse and consider notifying provider and/or RRT  Notify: Charge Nurse/RN  Name of Charge Nurse/RN Notified [place], RN  Provider Notification  Provider Name/Title Grier Mitts., MD  Date Provider Notified 07/08/23  Time Provider Notified 1532  Method of Notification Page  Notification Reason Change in status  Provider response Evaluate remotely;No new orders  Date of Provider Response 07/08/23  Time of Provider Response 1533  Assess: SIRS CRITERIA  SIRS Temperature  0  SIRS Respirations  1  SIRS Pulse 0  SIRS WBC 0  SIRS Score Sum  1

## 2023-07-08 NOTE — ED Notes (Signed)
Pt was transported to Vascular.

## 2023-07-08 NOTE — Progress Notes (Signed)
Pt arrived to 4E17 via stretcher from the ED. See assessment.

## 2023-07-08 NOTE — H&P (Signed)
History and Physical    Anthony A Meas Jr. YNW:[id number] DOB: 08/22/1955 DOA: 07/07/2023  PCP: Pcp, No  Chief Complaint: Swelling of legs  HPI: Anthony A Loomis Montez Hageman. is a 67 y.o. male with medical history significant of alcohol abuse, hypertension, homelessness, CVA, chronic HFmrEF, A-fib, history of PE in July 2024, hyperlipidemia, chronic hypotension, anxiety, depression, chronic low back pain, marijuana and tobacco abuse presented to the ED with complaints of shortness of breath and bilateral lower extremity edema.  Noted to be in A-fib with RVR with rate in the 140s when ambulating and 120s at rest.  Also noted to be intoxicated/ethanol on board per EMS.  Heart rate in the 120-130s on arrival to the ED.  Not hypotensive or hypoxic.  Afebrile.  Labs showing no leukocytosis, hemoglobin 10.0 (stable), sodium 132, bicarb 15, anion gap 16, glucose 69, creatinine 0.8, troponin negative x 2, BNP 553, magnesium 1.3, lactic acid 2.7> 2.0, ethanol level <10, blood cultures collected.  CTA chest showing acute PE with moderate embolic burden, stable relative dilation of the right heart chambers, and equivocal evidence for progressive right heart strain.  Also showing moderate right pleural effusion and subpleural groundglass pulmonary infiltrate within the left upper lobe, nonspecific, possibly infectious or inflammatory. EDP did bedside echo which reportedly appeared similar to prior.  Patient was given IV Cardizem bolus and started on continuous infusion.  He was also started on heparin drip and was given IV Lasix 40 mg.  TRH called to admit.  Patient is a poor historian.  States he is currently homeless and was staying at a motel from which he checked out today.  He is reporting bilateral lower extremity edema and dyspnea, unclear when his symptoms started.  Denies fevers or chest pain.  He has not taken any of his medications since after his hospital discharge in July, unclear whether he has a PCP.  He is not able  to give any additional history.  Review of Systems:  Review of Systems  All other systems reviewed and are negative.   Past Medical History:  Diagnosis Date   Actinic keratosis 11/27/2016   Alcohol abuse with alcohol-induced mood disorder (HCC) 07/18/2018   Alcohol use disorder, severe, dependence (HCC) 09/16/2006   05/22/2015 Argumentative, belligerent and verbally abusive to staff per his note from [place]    Alcoholism [facility])    Anxiety state 04/25/2018   Aortic atherosclerosis (HCC) 08/27/2022   Chronic low back pain 09/16/2006   Followed by NS.  Per his chart from [place], he fell off two storyhouse roof while cleaning his brother's gutters and sustained compression fracture of L3, L4 and L5 and fracture of right transverse process at L3 and nondisplaced fracture of left glenoid rim many years ago. He had multiple imaging in [place] including Lumbar MRI in 2016 which showed moderate to severe spinal canal stenos   Delirium tremens (HCC) 09/01/2017   Depression    Dry eye 11/23/2016   Foreign body in middle portion of esophagus 05/19/2022   Hiatal hernia 09/14/2016   Status Collis gastroplasty and Nissen's fundoplication on 04/29/2015 in    History of delirium tremons 07/22/2020   DTs during 10/2016 08/2017. And 12/2017 admissions    History of pulmonary embolus (PE) 11/02/2016   Unprovoked 11/01/2016. Xarelto from 11/01/16 to 09/08/2017.   Hydrocele    Surgically corrected   Hypertension    Hypoalbuminemia due to protein-calorie malnutrition (HCC) 07/22/2020   Lumbar compression fracture (HCC)    Multiple rib fractures 07/22/2019  Left rib details XR: left ribs demonstrate multiple remote rib   Pancytopenia (HCC)    Rhabdomyolysis 07/22/2020   Skin cancer    exciced 2017   Tinea versicolor 06/08/2013   Tobacco use disorder 07/22/2020   Tooth infection 04/25/2018   Trigger finger, acquired 11/18/2012   Ulnar tunnel syndrome of right wrist  11/08/2012    Past Surgical History:  Procedure Laterality Date   BIOPSY  05/21/2022   Procedure: BIOPSY;  Surgeon: Lynann Bologna, MD;  Location: [facility] ENDOSCOPY;  Service: Gastroenterology;;   CATARACT EXTRACTION     ESOPHAGOGASTRODUODENOSCOPY (EGD) WITH PROPOFOL N/A 05/21/2022   Procedure: ESOPHAGOGASTRODUODENOSCOPY (EGD) WITH PROPOFOL;  Surgeon: Lynann Bologna, MD;  Location: [facility] ENDOSCOPY;  Service: Gastroenterology;  Laterality: N/A;   FOREIGN BODY REMOVAL N/A 05/21/2022   Procedure: FOREIGN BODY REMOVAL;  Surgeon: Lynann Bologna, MD;  Location[facility] ENDOSCOPY;  Service: Gastroenterology;  Laterality: N/A;   HIATAL HERNIA REPAIR  2016   HYDROCELE EXCISION / REPAIR  2010   SKIN CANCER EXCISION Left    Excised 2017     reports that he has never smoked. His smokeless tobacco use includes snuff. He reports current alcohol use of about 43.0 standard drinks of alcohol per week. He reports current drug use. Drug: Marijuana.  Allergies  Allergen Reactions   Other Itching and Other (See Comments)    Seasonal allergies- Itchy eyes, runny nose, congestion   Poison Ivy Extract Rash    Family History  Problem Relation Age of Onset   Heart attack Father        died at 37 years   Dementia Father    Stroke Father    Cancer Sister    Colon cancer Neg Hx    Colon polyps Neg Hx    Esophageal cancer Neg Hx    Stomach cancer Neg Hx    Rectal cancer Neg Hx     Prior to Admission medications   Medication Sig Start Date End Date Taking? Authorizing Provider  acetaminophen (TYLENOL) 500 MG tablet Take 1 tablet (500 mg total) by mouth every 8 (eight) hours as needed. Patient taking differently: Take 1,000 mg by mouth every 6 (six) hours as needed (for pain). 10/13/22  Yes Leatha Gilding, MD  amiodarone (PACERONE) 200 MG tablet Take 1 tablet (200 mg total) by mouth daily. Patient not taking: Reported on 07/08/2023 12/27/22   Regalado, Jon Billings A, MD  apixaban (ELIQUIS) 5 MG TABS tablet Take 1 tablet (5  mg total) by mouth 2 (two) times daily. Patient not taking: Reported on 07/08/2023 03/06/23 04/05/23  Long, Arlyss Repress, MD  ferrous sulfate 325 (65 FE) MG tablet Take 1 tablet (325 mg total) by mouth daily with breakfast. Patient not taking: Reported on 07/08/2023 12/18/22 01/17/23  Regalado, Jon Billings A, MD  folic acid (FOLVITE) 1 MG tablet Take 1 tablet (1 mg total) by mouth daily. Patient not taking: Reported on 07/08/2023 02/16/23   Leroy Sea, MD  magnesium oxide (MAG-OX) 400 (240 Mg) MG tablet Take 1 tablet (400 mg total) by mouth 2 (two) times daily. Patient not taking: Reported on 07/08/2023 02/16/23   Leroy Sea, MD  metoprolol tartrate (LOPRESSOR) 25 MG tablet Take 1 tablet (25 mg total) by mouth 2 (two) times daily. Patient not taking: Reported on 07/08/2023 03/06/23 04/05/23  Maia Plan, MD  mometasone-formoterol [facility]) 200-5 MCG/ACT AERO Inhale 2 puffs into the lungs 2 (two) times daily. Patient not taking: Reported on 07/08/2023 02/16/23   Susa Raring  K, MD  pantoprazole (PROTONIX) 40 MG tablet Take 1 tablet (40 mg total) by mouth daily. Patient not taking: Reported on 07/08/2023 02/17/23   Leroy Sea, MD  QUEtiapine (SEROQUEL) 50 MG tablet Take 1 tablet (50 mg total) by mouth 2 (two) times daily. Patient not taking: Reported on 07/08/2023 02/16/23   Leroy Sea, MD  rosuvastatin (CRESTOR) 10 MG tablet Take 1 tablet (10 mg total) by mouth daily. Patient not taking: Reported on 07/07/2023 03/06/23   Long, Arlyss Repress, MD  thiamine (VITAMIN B-1) 100 MG tablet Take 1 tablet (100 mg total) by mouth daily. Patient not taking: Reported on 07/07/2023 02/17/23   Leroy Sea, MD    Physical Exam: Vitals:   07/07/23 2300 07/07/23 2330 07/07/23 2352 07/08/23 0000  BP: (!) 140/106 (!) 134/92  (!) 136/93  Pulse: 91 86  91  Resp: 18 20  20   Temp:   98.3 F (36.8 C)   TempSrc:   Oral   SpO2: 100% 99%  100%  Weight:      Height:        Physical Exam Vitals  reviewed.  Constitutional:      General: He is not in acute distress. HENT:     Head: Normocephalic and atraumatic.  Eyes:     Extraocular Movements: Extraocular movements intact.  Cardiovascular:     Rate and Rhythm: Normal rate. Rhythm irregular.     Pulses: Normal pulses.  Pulmonary:     Effort: Pulmonary effort is normal. No respiratory distress.     Breath sounds: No wheezing or rhonchi.  Abdominal:     General: Bowel sounds are normal. There is no distension.     Palpations: Abdomen is soft.     Tenderness: There is no abdominal tenderness. There is no guarding.  Musculoskeletal:     Cervical back: Normal range of motion.     Right lower leg: Edema present.     Left lower leg: Edema present.     Comments: 2+ pitting edema bilateral lower extremities.  Right lower extremity appears slightly larger in size, warm to touch, and mildly erythematous.  Skin:    General: Skin is warm and dry.  Neurological:     General: No focal deficit present.     Mental Status: He is alert and oriented to person, place, and time.     Labs on Admission: I have personally reviewed following labs and imaging studies  CBC: Recent Labs  Lab 07/07/23 1955  WBC 4.1  NEUTROABS 1.8  HGB 10.0*  HCT 31.4*  MCV 99.4  PLT 190   Basic Metabolic Panel: Recent Labs  Lab 07/07/23 1955  NA 132*  K 4.5  CL 101  CO2 15*  GLUCOSE 69*  BUN 13  CREATININE 0.80  CALCIUM 8.7*  MG 1.3*   GFR: Estimated Creatinine Clearance: 80.5 mL/min (by C-G formula based on SCr of 0.8 mg/dL). Liver Function Tests: No results for input(s): "AST", "ALT", "ALKPHOS", "BILITOT", "PROT", "ALBUMIN" in the last 168 hours. No results for input(s): "LIPASE", "AMYLASE" in the last 168 hours. No results for input(s): "AMMONIA" in the last 168 hours. Coagulation Profile: No results for input(s): "INR", "PROTIME" in the last 168 hours. Cardiac Enzymes: No results for input(s): "CKTOTAL", "CKMB", "CKMBINDEX", "TROPONINI"  in the last 168 hours. BNP (last 3 results) No results for input(s): "PROBNP" in the last 8760 hours. HbA1C: No results for input(s): "HGBA1C" in the last 72 hours. CBG: No results for input(s): "  GLUCAP" in the last 168 hours. Lipid Profile: No results for input(s): "CHOL", "HDL", "LDLCALC", "TRIG", "CHOLHDL", "LDLDIRECT" in the last 72 hours. Thyroid Function Tests: No results for input(s): "TSH", "T4TOTAL", "FREET4", "T3FREE", "THYROIDAB" in the last 72 hours. Anemia Panel: No results for input(s): "VITAMINB12", "FOLATE", "FERRITIN", "TIBC", "IRON", "RETICCTPCT" in the last 72 hours. Urine analysis:    Component Value Date/Time   COLORURINE AMBER (A) 02/04/2023 0051   APPEARANCEUR CLEAR 02/04/2023 0051   LABSPEC 1.045 (H) 02/04/2023 0051   PHURINE 5.0 02/04/2023 0051   GLUCOSEU NEGATIVE 02/04/2023 0051   HGBUR NEGATIVE 02/04/2023 0051   BILIRUBINUR NEGATIVE 02/04/2023 0051   KETONESUR NEGATIVE 02/04/2023 0051   PROTEINUR NEGATIVE 02/04/2023 0051   UROBILINOGEN 1.0 10/30/2010 1345   NITRITE NEGATIVE 02/04/2023 0051   LEUKOCYTESUR NEGATIVE 02/04/2023 0051    Radiological Exams on Admission: CT Angio Chest PE W and/or Wo Contrast Result Date: 07/07/2023 CLINICAL DATA:  High probability pulmonary embolism, tachycardia, leg swelling EXAM: CT ANGIOGRAPHY CHEST WITH CONTRAST TECHNIQUE: Multidetector CT imaging of the chest was performed using the standard protocol during bolus administration of intravenous contrast. Multiplanar CT image reconstructions and MIPs were obtained to evaluate the vascular anatomy. RADIATION DOSE REDUCTION: This exam was performed according to the departmental dose-optimization program which includes automated exposure control, adjustment of the mA and/or kV according to patient size and/or use of iterative reconstruction technique. CONTRAST:  75mL OMNIPAQUE IOHEXOL 350 MG/ML SOLN COMPARISON:  None Available. FINDINGS: Cardiovascular: There is adequate  opacification of the pulmonary arterial tree. In intraluminal filling defect is identified within the right lower lobar pulmonary artery in keeping with an acute pulmonary embolus. The embolic burden is moderate. The central pulmonary arteries are enlarged in keeping with changes of pulmonary arterial hypertension. There is reversal of normal RV/LV ratio, (RV/LV = 1.2), however, this is stable since prior examination 03/06/2023 and there is therefore equivocal evidence for progressive right heart strain. There is mild cardiomegaly with dilation of the right heart chambers and reflux of contrast into the hepatic venous system in keeping elevated right heart pressure and some degree of right heart failure. Moderate coronary artery calcification. No pericardial effusion. Thoracic aorta is of normal caliber. Minimal atherosclerotic calcification within the descending thoracic aorta. Mediastinum/Nodes: Shotty right hilar and mediastinal adenopathy may be reactive in nature. No frankly pathologic adenopathy within the abdomen and pelvis. Esophagus unremarkable. Visualized thyroid is unremarkable. Lungs/Pleura: Moderate right pleural effusion. Compressive atelectasis of the right lower lobe. Subpleural ground-glass pulmonary infiltrate within the left upper lobe, axial image # 50/5 is nonspecific, possibly infectious or inflammatory in the acute setting. No pneumothorax. No pleural effusion on the left. Small layering debris within the right bronchus intermedius. Upper Abdomen: No acute abnormality. Musculoskeletal: Osseous structures are age-appropriate. No acute bone abnormality. Multiple healed bilateral rib fractures are again identified. Review of the MIP images confirms the above findings. IMPRESSION: 1. Acute pulmonary embolus with moderate embolic burden. Stable relative dilation of the right heart chambers and equivocal evidence for progressive right heart strain as described above. 2. Mild cardiomegaly with  dilation of the right heart chambers and reflux of contrast into the hepatic venous system in keeping with elevated right heart pressure and some degree of right heart failure. 3. Moderate coronary artery calcification. 4. Moderate right pleural effusion with compressive atelectasis of the right lower lobe. 5. Subpleural ground-glass pulmonary infiltrate within the left upper lobe, nonspecific, possibly infectious or inflammatory in the acute setting. 6. Shotty right hilar and mediastinal adenopathy,  likely reactive in nature. Aortic Atherosclerosis (ICD10-I70.0). Electronically Signed   By: Helyn Numbers M.D.   On: 07/07/2023 22:25   DG Chest 2 View Result Date: 07/07/2023 CLINICAL DATA:  Dyspnea EXAM: CHEST - 2 VIEW COMPARISON:  03/08/2023 FINDINGS: Left costophrenic angle is partially excluded. Small right pleural effusion. There is progressive central pulmonary vascular congestion without overt pulmonary edema. No pneumothorax. Mild left basilar atelectasis. Cardiac size within normal limits. No acute bone abnormality. IMPRESSION: 1. Progressive central pulmonary vascular congestion without overt pulmonary edema. 2. New small right pleural effusion. Electronically Signed   By: Helyn Numbers M.D.   On: 07/07/2023 22:14    EKG: Independently reviewed.  A-fib with RVR.  Assessment and Plan  Recurrent PE History of previous PE in July 2024 and nonadherent to Eliquis therapy due to social issues/homelessness.  CTA chest showing acute PE with moderate embolic burden, stable relative dilation of the right heart chambers, and equivocal evidence for progressive right heart strain. EDP did bedside echo which reportedly appeared similar to prior.  No hypotension, hypoxia, or respiratory distress.  Currently satting 100% on room air.  Troponin negative x 2.  Formal echo ordered for further evaluation.  Continue heparin drip.  Bilateral lower extremity Dopplers ordered.  Continuous pulse ox, supplemental oxygen  as needed.  Paroxysmal A-fib with RVR In the setting of medication nonadherence and recurrent PE.  Rate initially in the 140s when ambulating and was started on Cardizem drip in the ED.  Rate now improved to 90-100 but remains in A-fib.  Continue Cardizem drip and and heparin drip.  Acute on chronic biventricular heart failure Moderate to severe MR In the setting of medication nonadherence.  BNP 553.  CT showing moderate right pleural effusion.  Last echo done in July 2024 showing EF 40 to 45%, severely reduced RV systolic function, severe biatrial dilation, and moderate to severe MR. Patient was given IV Lasix 40 mg in the ED.  Repeat echocardiogram ordered.  Given acute PE, monitor blood pressure and continue diuresis in the morning accordingly.  Monitor intake and output, daily weights.  Dietary fluid restriction.  ?Pneumonia CT showing subpleural groundglass pulmonary infiltrate within the left upper lobe, nonspecific, possibly infectious or inflammatory.  Pneumonia less likely given no fever or leukocytosis.  Check procalcitonin level.  Mild hypervolemic hyponatremia Patient was given IV Lasix for diuresis.  Continue to monitor labs.  Mild lactic acidosis Infection less likely given no fever or leukocytosis.  Lactic acid 2.7> 2.0.  Blood cultures pending.  High anion gap metabolic acidosis Likely related to lactic acidosis.  Bicarb 15, anion gap 16.  Repeat labs ordered and replace bicarb if needed.  Mild hypoglycemia Poor p.o. intake likely contributing.  No history of diabetes.  Hypoglycemia protocol/dextrose as needed.  Encourage p.o. intake.  Continue CBG checks every 4 hours for now.  Hypomagnesemia Could also be contributing to his arrhythmia.  Replace magnesium and continue to monitor labs.  Chronic anemia Hemoglobin stable, continue to monitor labs.  Alcohol abuse Tachycardia improved with Cardizem drip, no other signs of withdrawal at this time.  Placed on CIWA protocol;  Ativan as needed.  Thiamine, folate, and multivitamin.  Hypertension Currently on Cardizem drip and blood pressure stable.  History of CVA Hyperlipidemia Continue Crestor.  Code Status: Full Code (discussed with the patient) Level of care: Progressive Care Unit Admission status: It is my clinical opinion that admission to INPATIENT is reasonable and necessary because of the expectation that this patient will  require hospital care that crosses at least 2 midnights to treat this condition based on the medical complexity of the problems presented.  Given the aforementioned information, the predictability of an adverse outcome is felt to be significant.  John Giovanni MD [facility]  If 7PM-7AM, please contact night-coverage [email or web address]  07/08/2023, 1:26 AM

## 2023-07-08 NOTE — Progress Notes (Signed)
PHARMACY - ANTICOAGULATION CONSULT NOTE  Pharmacy Consult for IV heparin  Indication: pulmonary embolus  Allergies  Allergen Reactions   Other Itching and Other (See Comments)    Seasonal allergies- Itchy eyes, runny nose, congestion   Poison Ivy Extract Rash    Patient Measurements: Height: 5\' 8"  (172.7 cm) Weight: 63.5 kg (140 lb) IBW/kg (Calculated) : 68.4 Heparin Dosing Weight: 63.5 kg  Vital Signs: Temp: 98.4 F (36.9 C) (12/19 2113) Temp Source: Oral (12/19 2113) BP: 91/67 (12/19 2113) Pulse Rate: 66 (12/19 2113)  Labs: Recent Labs    07/07/23 1955 07/07/23 2300 07/08/23 0422 07/08/23 0422 07/08/23 1122 07/08/23 1335 07/08/23 2147  HGB 10.0*  --  9.4*  --   --   --   --   HCT 31.4*  --  28.4*  --   --   --   --   PLT 190  --  179  --   --   --   --   HEPARINUNFRC  --   --  0.22*   < > <0.10* 0.24* 0.22*  CREATININE 0.80  --  0.78  --   --   --   --   TROPONINIHS 14 11  --   --   --   --   --    < > = values in this interval not displayed.    Estimated Creatinine Clearance: 80.5 mL/min (by C-G formula based on SCr of 0.78 mg/dL).  Assessment: Anthony A Pong Montez Hageman. is a 67 y.o. year old [name] admitted on 07/07/2023 with concern for PE. CTA with acute PE with moderate embolic burden. No anticoagulation prior to admission. Prescribed eliquis for history of PE and afib with previous stroke. Afib RVR in ED which has been controlled with Cardizem drip. Pharmacy consulted to dose heparin.  Previous heparin level slightly subtherapeutic at 0.22 and s/p rate increase to 1300 units/hr. Per RN, no issues with the heparin infusion running continuously or signs/symptoms of bleeding   Goal of Therapy:  Heparin level 0.3-0.7 units/ml Monitor platelets by anticoagulation protocol: Yes   Plan:  Bolus 1000 units x1 Increase heparin gtt to 1450 units/hr F/u 8 hour heparin level F/u long term [facility] plan  Arabella Merles, PharmD. Clinical Pharmacist 07/08/2023 10:35 PM

## 2023-07-08 NOTE — Progress Notes (Signed)
PHARMACY - ANTICOAGULATION CONSULT NOTE  Pharmacy Consult for IV heparin  Indication: pulmonary embolus  Allergies  Allergen Reactions   Other Itching and Other (See Comments)    Seasonal allergies- Itchy eyes, runny nose, congestion   Poison Ivy Extract Rash    Patient Measurements: Height: 5\' 8"  (172.7 cm) Weight: 63.5 kg (140 lb) IBW/kg (Calculated) : 68.4 Heparin Dosing Weight: 63.5 kg  Vital Signs: Temp: 97.7 F (36.5 C) (12/19 0419) Temp Source: Oral (12/19 0419) BP: 116/77 (12/19 0545) Pulse Rate: 86 (12/19 0545)  Labs: Recent Labs    07/07/23 1955 07/07/23 2300 07/08/23 0422  HGB 10.0*  --  9.4*  HCT 31.4*  --  28.4*  PLT 190  --  179  HEPARINUNFRC  --   --  0.22*  CREATININE 0.80  --  0.78  TROPONINIHS 14 11  --     Estimated Creatinine Clearance: 80.5 mL/min (by C-G formula based on SCr of 0.78 mg/dL).   Medical History: Past Medical History:  Diagnosis Date   Actinic keratosis 11/27/2016   Alcohol abuse with alcohol-induced mood disorder (HCC) 07/18/2018   Alcohol use disorder, severe, dependence (HCC) 09/16/2006   05/22/2015 Argumentative, belligerent and verbally abusive to staff per his note from [place]    Alcoholism [facility])    Anxiety state 04/25/2018   Aortic atherosclerosis (HCC) 08/27/2022   Chronic low back pain 09/16/2006   Followed by [facility].  Per his chart from [place], he fell off two storyhouse roof while cleaning his brother's gutters and sustained compression fracture of L3, L4 and L5 and fracture of right transverse process at L3 and nondisplaced fracture of left glenoid rim many years ago. He had multiple imaging in [place] including Lumbar MRI in 2016 which showed moderate to severe spinal canal stenos   Delirium tremens (HCC) 09/01/2017   Depression    Dry eye 11/23/2016   Foreign body in middle portion of esophagus 05/19/2022   Hiatal hernia 09/14/2016   Status Collis gastroplasty and Nissen's fundoplication on  04/29/2015 in    History of delirium tremons 07/22/2020   DTs during 10/2016 08/2017. And 12/2017 admissions    History of pulmonary embolus (PE) 11/02/2016   Unprovoked 11/01/2016. Xarelto from 11/01/16 to 09/08/2017.   Hydrocele    Surgically corrected   Hypertension    Hypoalbuminemia due to protein-calorie malnutrition (HCC) 07/22/2020   Lumbar compression fracture (HCC)    Multiple rib fractures 07/22/2019   Left rib details XR: left ribs demonstrate multiple remote rib   Pancytopenia (HCC)    Rhabdomyolysis 07/22/2020   Skin cancer    exciced 2017   Tinea versicolor 06/08/2013   Tobacco use disorder 07/22/2020   Tooth infection 04/25/2018   Trigger finger, acquired 11/18/2012   Ulnar tunnel syndrome of right wrist 11/08/2012    Assessment: Anthony [name]. is a 67 y.o. year old male admitted on 07/07/2023 with concern for PE. CTA with acute PE with moderate embolic burden. No anticoagulation prior to admission. Prescribed eliquis for history of PE and afib with previous stroke. Afib RVR in ED which has been controlled with Cardizem drip. Pharmacy consulted to dose heparin.  Heparin level 0.22, subtherapeutic  Heparin running appropriately at 1000 units/hr. No issues with infusion or overt s/sx of bleeding.  Goal of Therapy:  Heparin level 0.3-0.7 units/ml Monitor platelets by anticoagulation protocol: Yes   Plan:  Increase heparin infusion at 1100 units/hr 6 heparin level  Daily heparin level, CBC, and monitoring for bleeding F/u  plans for anticoagulation   Thank you for allowing pharmacy to participate in this patient's care.  Marja Kays, PharmD Emergency Medicine Clinical Pharmacist 07/08/2023,6:51 AM

## 2023-07-08 NOTE — ED Notes (Addendum)
ED TO INPATIENT HANDOFF REPORT  ED Nurse Name and Phone #: Rodney Booze [phone] PT has medications in his black bookbag that need to be locked up.  S Name/Age/Gender Case Anthony Mccutchen Jr. 67 y.o. male Room/Bed: 008C/008C  Code Status   Code Status: Full Code  Home/SNF/Other Home Patient oriented to: self, place, time, and situation Is this baseline? Yes   Triage Complete: Triage complete  Chief Complaint Acute pulmonary embolism (HCC) [I26.99]  Triage Note Patient bib gcems for bilateral leg swelling ongoing for 2 weeks. Hx of chf, afib. Not compliant with medications. Denies chest pain or shob. EMS reports tachycardia in the 140's when ambulating and 120's at rest. Etoh on board.     Allergies Allergies  Allergen Reactions   Other Itching and Other (See Comments)    Seasonal allergies- Itchy eyes, runny nose, congestion   Poison Ivy Extract Rash    Level of Care/Admitting Diagnosis ED Disposition     ED Disposition  Admit   Condition  --   Comment  Hospital [facility]: [facility] [[id number]]  Level of Care: Progressive [102]  Admit to Progressive based on following criteria: CARDIOVASCULAR & THORACIC of moderate stability with acute coronary syndrome symptoms/low risk myocardial infarction/hypertensive urgency/arrhythmias/heart failure potentially compromising stability and stable post cardiovascular intervention patients.  Admit to Progressive based on following criteria: RESPIRATORY PROBLEMS hypoxemic/hypercapnic respiratory failure that is responsive to NIPPV (BiPAP) or High Flow Nasal Cannula (6-80 lpm). Frequent assessment/intervention, no > Q2 hrs < Q4 hrs, to maintain oxygenation and pulmonary hygiene.  May admit patient to [facility] or [facility] if equivalent level of care is available:: Yes  Covid Evaluation: Asymptomatic - no recent exposure (last 10 days) testing not required  Diagnosis: Acute pulmonary embolism [facility]) [[id number]]  Admitting Physician:  John Giovanni [[id number]]  Attending Physician: John Giovanni [[id number]]  Certification:: I certify this patient will need inpatient services for at least 2 midnights  Expected Medical Readiness: 07/10/2023          B Medical/Surgery History Past Medical History:  Diagnosis Date   Actinic keratosis 11/27/2016   Alcohol abuse with alcohol-induced mood disorder (HCC) 07/18/2018   Alcohol use disorder, severe, dependence (HCC) 09/16/2006   05/22/2015 Argumentative, belligerent and verbally abusive to staff per his note from [place]    Alcoholism [facility])    Anxiety state 04/25/2018   Aortic atherosclerosis (HCC) 08/27/2022   Chronic low back pain 09/16/2006   Followed by NS.  Per his chart from [place], he fell off two storyhouse roof while cleaning his brother's gutters and sustained compression fracture of L3, L4 and L5 and fracture of right transverse process at L3 and nondisplaced fracture of left glenoid rim many years ago. He had multiple imaging in [place] including Lumbar MRI in 2016 which showed moderate to severe spinal canal stenos   Delirium tremens (HCC) 09/01/2017   Depression    Dry eye 11/23/2016   Foreign body in middle portion of esophagus 05/19/2022   Hiatal hernia 09/14/2016   Status Collis gastroplasty and Nissen's fundoplication on 04/29/2015 in [place]   History of delirium tremons 07/22/2020   DTs during 10/2016 08/2017. And 12/2017 admissions    History of pulmonary embolus (PE) 11/02/2016   Unprovoked 11/01/2016. Xarelto from 11/01/16 to 09/08/2017.   Hydrocele    Surgically corrected   Hypertension    Hypoalbuminemia due to protein-calorie malnutrition (HCC) 07/22/2020   Lumbar compression fracture (HCC)    Multiple rib fractures 07/22/2019   Left rib  details XR: left ribs demonstrate multiple remote rib   Pancytopenia (HCC)    Rhabdomyolysis 07/22/2020   Skin cancer    exciced 2017   Tinea versicolor 06/08/2013   Tobacco use  disorder 07/22/2020   Tooth infection 04/25/2018   Trigger finger, acquired 11/18/2012   Ulnar tunnel syndrome of right wrist 11/08/2012   Past Surgical History:  Procedure Laterality Date   BIOPSY  05/21/2022   Procedure: BIOPSY;  Surgeon: Lynann Bologna, MD;  Location: [facility] ENDOSCOPY;  Service: Gastroenterology;;   CATARACT EXTRACTION     ESOPHAGOGASTRODUODENOSCOPY (EGD) WITH PROPOFOL N/Anthony 05/21/2022   Procedure: ESOPHAGOGASTRODUODENOSCOPY (EGD) WITH PROPOFOL;  Surgeon: Lynann Bologna, MD;  Location: [facility] ENDOSCOPY;  Service: Gastroenterology;  Laterality: N/Anthony;   FOREIGN BODY REMOVAL N/Anthony 05/21/2022   Procedure: FOREIGN BODY REMOVAL;  Surgeon: Lynann Bologna, MD;  Location: [facility] ENDOSCOPY;  Service: Gastroenterology;  Laterality: N/Anthony;   HIATAL HERNIA REPAIR  2016   HYDROCELE EXCISION / REPAIR  2010   SKIN CANCER EXCISION Left    Excised 2017     Anthony IV Location/Drains/Wounds Patient Lines/Drains/Airways Status     Active Line/Drains/Airways     Name Placement date Placement time Site Days   Peripheral IV 07/07/23 20 G 1.88" Anterior;Distal;Left;Upper Arm 07/07/23  2130  Arm  1   Peripheral IV 07/07/23 20 G Anterior;Right Hand 07/07/23  2211  Hand  1            Intake/Output Last 24 hours  Intake/Output Summary (Last 24 hours) at 07/08/2023 1231 Last data filed at 07/08/2023 1118 Gross per 24 hour  Intake 80.39 ml  Output 2650 ml  Net -2569.61 ml    Labs/Imaging Results for orders placed or performed during the hospital encounter of 07/07/23 (from the past 48 hours)  CBC with Differential     Status: Abnormal   Collection Time: 07/07/23  7:55 PM  Result Value Ref Range   WBC 4.1 4.0 - 10.5 K/uL   RBC 3.16 (L) 4.22 - 5.81 MIL/uL   Hemoglobin 10.0 (L) 13.0 - 17.0 g/dL   HCT 16.1 (L) 09.6 - 04.5 %   MCV 99.4 80.0 - 100.0 fL   MCH 31.6 26.0 - 34.0 pg   MCHC 31.8 30.0 - 36.0 g/dL   RDW 40.9 (H) 81.1 - 91.4 %   Platelets 190 150 - 400 K/uL   nRBC 0.0 0.0 - 0.2 %   Neutrophils  Relative % 43 %   Neutro Abs 1.8 1.7 - 7.7 K/uL   Lymphocytes Relative 39 %   Lymphs Abs 1.6 0.7 - 4.0 K/uL   Monocytes Relative 14 %   Monocytes Absolute 0.6 0.1 - 1.0 K/uL   Eosinophils Relative 2 %   Eosinophils Absolute 0.1 0.0 - 0.5 K/uL   Basophils Relative 2 %   Basophils Absolute 0.1 0.0 - 0.1 K/uL   Immature Granulocytes 0 %   Abs Immature Granulocytes 0.01 0.00 - 0.07 K/uL    Comment: Performed at [facility], [address] [place], [place] [address]  Basic metabolic panel     Status: Abnormal   Collection Time: 07/07/23  7:55 PM  Result Value Ref Range   Sodium 132 (L) 135 - 145 mmol/L   Potassium 4.5 3.5 - 5.1 mmol/L    Comment: HEMOLYSIS AT THIS LEVEL MAY AFFECT RESULT   Chloride 101 98 - 111 mmol/L   CO2 15 (L) 22 - 32 mmol/L   Glucose, Bld 69 (L) 70 - 99 mg/dL  Comment: Glucose reference range applies only to samples taken after fasting for at least 8 hours.   BUN 13 8 - 23 mg/dL   Creatinine, Ser 5.57 0.61 - 1.24 mg/dL   Calcium 8.7 (L) 8.9 - 10.3 mg/dL   GFR, Estimated >32 >20 mL/min    Comment: (NOTE) Calculated using the CKD-EPI Creatinine Equation (2021)    Anion gap 16 (H) 5 - 15    Comment: Performed at [facility], [address], [place], [place] [address]  Troponin I (High Sensitivity)     Status: None   Collection Time: 07/07/23  7:55 PM  Result Value Ref Range   Troponin I (High Sensitivity) 14 <18 ng/L    Comment: (NOTE) Elevated high sensitivity troponin I (hsTnI) values and significant  changes across serial measurements may suggest ACS but many other  chronic and acute conditions are known to elevate hsTnI results.  Refer to the "Links" section for chest pain algorithms and additional  guidance. Performed at [facility], [address], [place], [place] [address]   Brain natriuretic peptide     Status: Abnormal   Collection Time: 07/07/23  7:55 PM  Result Value Ref Range   B Natriuretic Peptide 553.5 (H) 0.0 -  100.0 pg/mL    Comment: Performed at [facility], [address], [place], [place] [address]  Magnesium     Status: Abnormal   Collection Time: 07/07/23  7:55 PM  Result Value Ref Range   Magnesium 1.3 (L) 1.7 - 2.4 mg/dL    Comment: Performed at [facility], [address] [place], [place] [address]  I-Stat CG4 Lactic Acid     Status: Abnormal   Collection Time: 07/07/23  9:33 PM  Result Value Ref Range   Lactic Acid, Venous 2.7 (HH) 0.5 - 1.9 mmol/L   Comment NOTIFIED PHYSICIAN   Ethanol     Status: None   Collection Time: 07/07/23  9:33 PM  Result Value Ref Range   Alcohol, Ethyl (B) <10 <10 mg/dL    Comment: (NOTE) Lowest detectable limit for serum alcohol is 10 mg/dL.  For medical purposes only. Performed at [facility], [address], [place], [place] [address]   Blood culture (routine x 2)     Status: None (Preliminary result)   Collection Time: 07/07/23  9:39 PM   Specimen: BLOOD LEFT ARM  Result Value Ref Range   Specimen Description BLOOD LEFT ARM    Special Requests      BOTTLES DRAWN AEROBIC AND ANAEROBIC Blood Culture adequate volume   Culture      NO GROWTH < 12 HOURS Performed at [facility], [address] [place], [place] [address]    Report Status PENDING   Blood culture (routine x 2)     Status: None (Preliminary result)   Collection Time: 07/07/23  9:48 PM   Specimen: BLOOD RIGHT HAND  Result Value Ref Range   Specimen Description BLOOD RIGHT HAND    Special Requests      BOTTLES DRAWN AEROBIC AND ANAEROBIC Blood Culture results may not be optimal due to an inadequate volume of blood received in culture bottles   Culture      NO GROWTH < 12 HOURS Performed at [facility], [address], [place], [place] [address]    Report Status PENDING   Troponin I (High Sensitivity)     Status: None   Collection Time:  07/07/23 11:00 PM  Result Value Ref Range   Troponin I (High Sensitivity) 11 <18 ng/L    Comment:  (NOTE) Elevated high sensitivity troponin I (hsTnI) values and significant  changes across serial measurements may suggest ACS but many other  chronic and acute conditions are known to elevate hsTnI results.  Refer to the "Links" section for chest pain algorithms and additional  guidance. Performed at [facility], [address], [place], [place] [address]   I-Stat CG4 Lactic Acid     Status: Abnormal   Collection Time: 07/07/23 11:08 PM  Result Value Ref Range   Lactic Acid, Venous 2.0 (HH) 0.5 - 1.9 mmol/L   Comment NOTIFIED PHYSICIAN   CBG monitoring, ED     Status: None   Collection Time: 07/08/23  4:20 AM  Result Value Ref Range   Glucose-Capillary 78 70 - 99 mg/dL    Comment: Glucose reference range applies only to samples taken after fasting for at least 8 hours.  Heparin level (unfractionated)     Status: Abnormal   Collection Time: 07/08/23  4:22 AM  Result Value Ref Range   Heparin Unfractionated 0.22 (L) 0.30 - 0.70 IU/mL    Comment: (NOTE) The clinical reportable range upper limit is being lowered to >1.10 to align with the FDA approved guidance for the current laboratory assay.  If heparin results are below expected values, and patient dosage has  been confirmed, suggest follow up testing of antithrombin III levels. Performed at [facility], [address], [place], [place] [address]   CBC     Status: Abnormal   Collection Time: 07/08/23  4:22 AM  Result Value Ref Range   WBC 4.0 4.0 - 10.5 K/uL   RBC 2.97 (L) 4.22 - 5.81 MIL/uL   Hemoglobin 9.4 (L) 13.0 - 17.0 g/dL   HCT 96.2 (L) 95.2 - 84.1 %   MCV 95.6 80.0 - 100.0 fL   MCH 31.6 26.0 - 34.0 pg   MCHC 33.1 30.0 - 36.0 g/dL   RDW 32.4 (H) 40.1 - 02.7 %   Platelets 179 150 - 400 K/uL   nRBC 0.0 0.0 - 0.2 %    Comment: Performed at [facility], [address] [place], [place] [address]  HIV Antibody (routine testing w rflx)     Status: None   Collection Time: 07/08/23  4:22 AM  Result  Value Ref Range   HIV Screen 4th Generation wRfx Non Reactive Non Reactive    Comment: Performed at [facility], [address], [place], [place] [address]  Comprehensive metabolic panel     Status: Abnormal   Collection Time: 07/08/23  4:22 AM  Result Value Ref Range   Sodium 133 (L) 135 - 145 mmol/L   Potassium 3.3 (L) 3.5 - 5.1 mmol/L   Chloride 102 98 - 111 mmol/L   CO2 21 (L) 22 - 32 mmol/L   Glucose, Bld 72 70 - 99 mg/dL    Comment: Glucose reference range applies only to samples taken after fasting for at least 8 hours.   BUN 10 8 - 23 mg/dL   Creatinine, Ser 7.42 0.61 - 1.24 mg/dL   Calcium 8.2 (L) 8.9 - 10.3 mg/dL   Total Protein 6.5 6.5 - 8.1 g/dL   Albumin 3.0 (L) 3.5 - 5.0 g/dL   AST 47 (H) 15 - 41 U/L   ALT 20 0 - 44 U/L   Alkaline Phosphatase 134 (H)  38 - 126 U/L   Total Bilirubin 1.6 (H) <1.2 mg/dL   GFR, Estimated >16 >10 mL/min    Comment: (NOTE) Calculated using the CKD-EPI Creatinine Equation (2021)    Anion gap 10 5 - 15    Comment: Performed at [facility], [address], [place], [place] [address]  Magnesium     Status: Abnormal   Collection Time: 07/08/23  4:22 AM  Result Value Ref Range   Magnesium 1.2 (L) 1.7 - 2.4 mg/dL    Comment: Performed at [facility], [address] [place], [place] [address]  Procalcitonin     Status: None   Collection Time: 07/08/23  4:22 AM  Result Value Ref Range   Procalcitonin <0.10 ng/mL    Comment:        Interpretation: PCT (Procalcitonin) <= 0.5 ng/mL: Systemic infection (sepsis) is not likely. Local bacterial infection is possible. (NOTE)       Sepsis PCT Algorithm           Lower Respiratory Tract                                      Infection PCT Algorithm    ----------------------------     ----------------------------         PCT < 0.25 ng/mL                PCT < 0.10 ng/mL          Strongly encourage             Strongly discourage   discontinuation of antibiotics    initiation of  antibiotics    ----------------------------     -----------------------------       PCT 0.25 - 0.50 ng/mL            PCT 0.10 - 0.25 ng/mL               OR       >80% decrease in PCT            Discourage initiation of                                            antibiotics      Encourage discontinuation           of antibiotics    ----------------------------     -----------------------------         PCT >= 0.50 ng/mL              PCT 0.26 - 0.50 ng/mL               AND        <80% decrease in PCT             Encourage initiation of                                             antibiotics       Encourage continuation           of antibiotics    ----------------------------     -----------------------------        PCT >= 0.50  ng/mL                  PCT > 0.50 ng/mL               AND         increase in PCT                  Strongly encourage                                      initiation of antibiotics    Strongly encourage escalation           of antibiotics                                     -----------------------------                                           PCT <= 0.25 ng/mL                                                 OR                                        > 80% decrease in PCT                                      Discontinue / Do not initiate                                             antibiotics  Performed at [facility], [address], [place], [place] [address]   CBG monitoring, ED     Status: None   Collection Time: 07/08/23  8:09 AM  Result Value Ref Range   Glucose-Capillary 78 70 - 99 mg/dL    Comment: Glucose reference range applies only to samples taken after fasting for at least 8 hours.   Comment 1 Notify RN    Comment 2 Document in Chart   Urinalysis, Routine w reflex microscopic -Urine, Clean Catch     Status: None   Collection Time: 07/08/23 10:24 AM  Result Value Ref Range   Color, Urine YELLOW YELLOW   APPearance CLEAR CLEAR    Specific Gravity, Urine 1.015 1.005 - 1.030   pH 6.0 5.0 - 8.0   Glucose, UA NEGATIVE NEGATIVE mg/dL   Hgb urine dipstick NEGATIVE NEGATIVE   Bilirubin Urine NEGATIVE NEGATIVE   Ketones, ur NEGATIVE NEGATIVE mg/dL   Protein, ur NEGATIVE NEGATIVE mg/dL   Nitrite NEGATIVE NEGATIVE   Leukocytes,Ua NEGATIVE NEGATIVE   RBC / HPF 0-5 0 - 5 RBC/hpf   WBC, UA 0-5 0 - 5 WBC/hpf   Bacteria, UA NONE SEEN NONE SEEN   Squamous Epithelial / HPF  0-5 0 - 5 /HPF    Comment: Performed at [facility], [address], [place], [place] [address]  Heparin level (unfractionated)     Status: Abnormal   Collection Time: 07/08/23 11:22 AM  Result Value Ref Range   Heparin Unfractionated <0.10 (L) 0.30 - 0.70 IU/mL    Comment: (NOTE) The clinical reportable range upper limit is being lowered to >1.10 to align with the FDA approved guidance for the current laboratory assay.  If heparin results are below expected values, and patient dosage has  been confirmed, suggest follow up testing of antithrombin III levels. Performed at [facility], [address], [place], [place] [address]    VAS [place] LOWER EXTREMITY VENOUS (DVT) Result Date: 07/08/2023  Lower Venous DVT Study Patient Name:  Boniface Anthony Callanan Jr.  Date of Exam:   07/08/2023 Medical Rec #: [id number]         Accession #:    [id number] Date of Birth: February 18, 1956         Patient Gender: M Patient Age:   22 years Exam Location:  [facility] Procedure:      VAS [place] LOWER EXTREMITY VENOUS (DVT) Referring Phys: Ulyess Blossom RATHORE --------------------------------------------------------------------------------  Indications: Pain, Swelling, and pulmonary embolism.  Comparison Study: 02/04/23 - Negative Performing Technologist: Marilynne Halsted RDMS, RVT  Examination Guidelines: Anthony complete evaluation includes B-mode imaging, spectral Doppler, color Doppler, and power Doppler as needed of all accessible portions of each vessel. Bilateral testing is considered  an integral part of Anthony complete examination. Limited examinations for reoccurring indications may be performed as noted. The reflux portion of the exam is performed with the patient in reverse Trendelenburg.  +---------+---------------+---------+-----------+----------+--------------+ RIGHT    CompressibilityPhasicitySpontaneityPropertiesThrombus Aging +---------+---------------+---------+-----------+----------+--------------+ CFV      Full           Yes      Yes                                 +---------+---------------+---------+-----------+----------+--------------+ SFJ      Full                                                        +---------+---------------+---------+-----------+----------+--------------+ FV Prox  Full                                                        +---------+---------------+---------+-----------+----------+--------------+ FV Mid   Full                                                        +---------+---------------+---------+-----------+----------+--------------+ FV DistalFull                                                        +---------+---------------+---------+-----------+----------+--------------+ PFV  Full                                                        +---------+---------------+---------+-----------+----------+--------------+ POP      Full           Yes      Yes                                 +---------+---------------+---------+-----------+----------+--------------+ PTV      Full                                                        +---------+---------------+---------+-----------+----------+--------------+ PERO     Full                                                        +---------+---------------+---------+-----------+----------+--------------+   +---------+---------------+---------+-----------+----------+-----------------+ LEFT      CompressibilityPhasicitySpontaneityPropertiesThrombus Aging    +---------+---------------+---------+-----------+----------+-----------------+ CFV      Full           Yes      Yes                                    +---------+---------------+---------+-----------+----------+-----------------+ SFJ      Full                                                           +---------+---------------+---------+-----------+----------+-----------------+ FV Prox  Full                                                           +---------+---------------+---------+-----------+----------+-----------------+ FV Mid   Full                                                           +---------+---------------+---------+-----------+----------+-----------------+ FV DistalFull                                                           +---------+---------------+---------+-----------+----------+-----------------+ PFV      Full                                                           +---------+---------------+---------+-----------+----------+-----------------+  POP      Full           Yes      Yes                                    +---------+---------------+---------+-----------+----------+-----------------+ PTV      Full                                                           +---------+---------------+---------+-----------+----------+-----------------+ PERO                                                  Poorly visualized +---------+---------------+---------+-----------+----------+-----------------+     Summary: RIGHT: - There is no evidence of deep vein thrombosis in the lower extremity.  - No cystic structure found in the popliteal fossa.  LEFT: - There is no evidence of deep vein thrombosis in the lower extremity. However, portions of this examination were limited- see technologist comments above.  *See table(s) above for measurements and observations. Electronically  signed by Carolynn Sayers on 07/08/2023 at 12:05:13 PM.    Final    CT Angio Chest PE W and/or Wo Contrast Result Date: 07/07/2023 CLINICAL DATA:  High probability pulmonary embolism, tachycardia, leg swelling EXAM: CT ANGIOGRAPHY CHEST WITH CONTRAST TECHNIQUE: Multidetector CT imaging of the chest was performed using the standard protocol during bolus administration of intravenous contrast. Multiplanar CT image reconstructions and MIPs were obtained to evaluate the vascular anatomy. RADIATION DOSE REDUCTION: This exam was performed according to the departmental dose-optimization program which includes automated exposure control, adjustment of the mA and/or kV according to patient size and/or use of iterative reconstruction technique. CONTRAST:  75mL OMNIPAQUE IOHEXOL 350 MG/ML SOLN COMPARISON:  None Available. FINDINGS: Cardiovascular: There is adequate opacification of the pulmonary arterial tree. In intraluminal filling defect is identified within the right lower lobar pulmonary artery in keeping with an acute pulmonary embolus. The embolic burden is moderate. The central pulmonary arteries are enlarged in keeping with changes of pulmonary arterial hypertension. There is reversal of normal RV/LV ratio, (RV/LV = 1.2), however, this is stable since prior examination 03/06/2023 and there is therefore equivocal evidence for progressive right heart strain. There is mild cardiomegaly with dilation of the right heart chambers and reflux of contrast into the hepatic venous system in keeping elevated right heart pressure and some degree of right heart failure. Moderate coronary artery calcification. No pericardial effusion. Thoracic aorta is of normal caliber. Minimal atherosclerotic calcification within the descending thoracic aorta. Mediastinum/Nodes: Shotty right hilar and mediastinal adenopathy may be reactive in nature. No frankly pathologic adenopathy within the abdomen and pelvis. Esophagus unremarkable.  Visualized thyroid is unremarkable. Lungs/Pleura: Moderate right pleural effusion. Compressive atelectasis of the right lower lobe. Subpleural ground-glass pulmonary infiltrate within the left upper lobe, axial image # 50/5 is nonspecific, possibly infectious or inflammatory in the acute setting. No pneumothorax. No pleural effusion on the left. Small layering debris within the right bronchus intermedius. Upper Abdomen: No acute abnormality. Musculoskeletal: Osseous structures are age-appropriate. No acute bone abnormality. Multiple healed bilateral rib fractures are again identified. Review  of the MIP images confirms the above findings. IMPRESSION: 1. Acute pulmonary embolus with moderate embolic burden. Stable relative dilation of the right heart chambers and equivocal evidence for progressive right heart strain as described above. 2. Mild cardiomegaly with dilation of the right heart chambers and reflux of contrast into the hepatic venous system in keeping with elevated right heart pressure and some degree of right heart failure. 3. Moderate coronary artery calcification. 4. Moderate right pleural effusion with compressive atelectasis of the right lower lobe. 5. Subpleural ground-glass pulmonary infiltrate within the left upper lobe, nonspecific, possibly infectious or inflammatory in the acute setting. 6. Shotty right hilar and mediastinal adenopathy, likely reactive in nature. Aortic Atherosclerosis (ICD10-I70.0). Electronically Signed   By: Helyn Numbers M.D.   On: 07/07/2023 22:25   DG Chest 2 View Result Date: 07/07/2023 CLINICAL DATA:  Dyspnea EXAM: CHEST - 2 VIEW COMPARISON:  03/08/2023 FINDINGS: Left costophrenic angle is partially excluded. Small right pleural effusion. There is progressive central pulmonary vascular congestion without overt pulmonary edema. No pneumothorax. Mild left basilar atelectasis. Cardiac size within normal limits. No acute bone abnormality. IMPRESSION: 1. Progressive  central pulmonary vascular congestion without overt pulmonary edema. 2. New small right pleural effusion. Electronically Signed   By: Helyn Numbers M.D.   On: 07/07/2023 22:14    Pending Labs Unresulted Labs (From admission, onward)     Start     Ordered   07/09/23 0500  Heparin level (unfractionated)  Daily,   R      07/07/23 2253   07/08/23 0500  CBC  Daily,   R      07/07/23 2253            Vitals/Pain Today's Vitals   07/08/23 0811 07/08/23 0900 07/08/23 0907 07/08/23 1116  BP: 120/84 131/79  108/71  Pulse: 82 82  84  Resp: 17 18  16   Temp: 97.6 F (36.4 C)     TempSrc:      SpO2: 97% 99%  98%  Weight:      Height:      PainSc:   4      Isolation Precautions No active isolations  Medications Medications  heparin ADULT infusion 100 units/mL (25000 units/253mL) (1,100 Units/hr Intravenous Rate/Dose Verify 07/08/23 0906)  rosuvastatin (CRESTOR) tablet 10 mg (10 mg Oral Given 07/08/23 0908)  acetaminophen (TYLENOL) tablet 650 mg (has no administration in time range)    Or  acetaminophen (TYLENOL) suppository 650 mg (has no administration in time range)  dextrose (GLUTOSE) oral gel 40% (has no administration in time range)  LORazepam (ATIVAN) tablet 1-4 mg (has no administration in time range)    Or  LORazepam (ATIVAN) injection 1-4 mg (has no administration in time range)  thiamine (VITAMIN B1) tablet 100 mg (100 mg Oral Given 07/08/23 0907)    Or  thiamine (VITAMIN B1) injection 100 mg ( Intravenous See Alternative 07/08/23 0907)  folic acid (FOLVITE) tablet 1 mg (1 mg Oral Given 07/08/23 0908)  multivitamin with minerals tablet 1 tablet (1 tablet Oral Given 07/08/23 0907)  metoprolol tartrate (LOPRESSOR) tablet 25 mg (25 mg Oral Given 07/08/23 1117)  furosemide (LASIX) injection 40 mg (40 mg Intravenous Given 07/08/23 1121)  chlordiazePOXIDE (LIBRIUM) capsule 25 mg (25 mg Oral Given 07/08/23 1117)    Followed by  chlordiazePOXIDE (LIBRIUM) capsule 25 mg (has  no administration in time range)    Followed by  chlordiazePOXIDE (LIBRIUM) capsule 25 mg (has no administration in time range)  Followed by  chlordiazePOXIDE (LIBRIUM) capsule 25 mg (has no administration in time range)  diltiazem (CARDIZEM) 1 mg/mL load via infusion 15 mg (15 mg Intravenous Bolus from Bag 07/07/23 2208)  iohexol (OMNIPAQUE) 350 MG/ML injection 75 mL (75 mLs Intravenous Contrast Given 07/07/23 2204)  furosemide (LASIX) injection 40 mg (40 mg Intravenous Given 07/07/23 2230)  heparin bolus via infusion 4,500 Units (4,500 Units Intravenous Bolus from Bag 07/07/23 2301)  magnesium sulfate IVPB 4 g 100 mL (0 g Intravenous Stopped 07/08/23 6073)    Mobility walks     Focused Assessments Cardiac Assessment Handoff:  Cardiac Rhythm: Atrial fibrillation Lab Results  Component Value Date   CKTOTAL 193 02/04/2023   TROPONINI 0.03 (HH) 11/02/2016   Lab Results  Component Value Date   DDIMER 1.82 (H) 08/27/2022   Does the Patient currently have chest pain? No    R Recommendations: See Admitting Provider Note  Report given to:   Additional Notes:

## 2023-07-08 NOTE — Progress Notes (Signed)
PHARMACY - ANTICOAGULATION CONSULT NOTE  Pharmacy Consult for IV heparin  Indication: pulmonary embolus  Allergies  Allergen Reactions   Other Itching and Other (See Comments)    Seasonal allergies- Itchy eyes, runny nose, congestion   Poison Ivy Extract Rash    Patient Measurements: Height: 5\' 8"  (172.7 cm) Weight: 63.5 kg (140 lb) IBW/kg (Calculated) : 68.4 Heparin Dosing Weight: 63.5 kg  Vital Signs: Temp: 97.6 F (36.4 C) (12/19 0811) Temp Source: Oral (12/19 0419) BP: 100/83 (12/19 1400) Pulse Rate: 67 (12/19 1400)  Labs: Recent Labs    07/07/23 1955 07/07/23 2300 07/08/23 0422 07/08/23 1122  HGB 10.0*  --  9.4*  --   HCT 31.4*  --  28.4*  --   PLT 190  --  179  --   HEPARINUNFRC  --   --  0.22* <0.10*  CREATININE 0.80  --  0.78  --   TROPONINIHS 14 11  --   --     Estimated Creatinine Clearance: 80.5 mL/min (by C-G formula based on SCr of 0.78 mg/dL).   Medical History: Past Medical History:  Diagnosis Date   Actinic keratosis 11/27/2016   Alcohol abuse with alcohol-induced mood disorder (HCC) 07/18/2018   Alcohol use disorder, severe, dependence (HCC) 09/16/2006   05/22/2015 Argumentative, belligerent and verbally abusive to staff per his note from [place]    Alcoholism [facility])    Anxiety state 04/25/2018   Aortic atherosclerosis (HCC) 08/27/2022   Chronic low back pain 09/16/2006   Followed by NS.  Per his chart from [place], he fell off two storyhouse roof while cleaning his brother's gutters and sustained compression fracture of L3, L4 and L5 and fracture of right transverse process at L3 and nondisplaced fracture of left glenoid rim many years ago. He had multiple imaging in [place] including Lumbar MRI in 2016 which showed moderate to severe spinal canal stenos   Delirium tremens (HCC) 09/01/2017   Depression    Dry eye 11/23/2016   Foreign body in middle portion of esophagus 05/19/2022   Hiatal hernia 09/14/2016   Status Collis  gastroplasty and Nissen's fundoplication on 04/29/2015 in  [place]   History of delirium tremons 07/22/2020   DTs during 10/2016 08/2017. And 12/2017 admissions    History of pulmonary embolus (PE) 11/02/2016   Unprovoked 11/01/2016. Xarelto from 11/01/16 to 09/08/2017.   Hydrocele    Surgically corrected   Hypertension    Hypoalbuminemia due to protein-calorie malnutrition (HCC) 07/22/2020   Lumbar compression fracture (HCC)    Multiple rib fractures 07/22/2019   Left rib details XR: left ribs demonstrate multiple remote rib   Pancytopenia (HCC)    Rhabdomyolysis 07/22/2020   Skin cancer    exciced 2017   Tinea versicolor 06/08/2013   Tobacco use disorder 07/22/2020   Tooth infection 04/25/2018   Trigger finger, acquired 11/18/2012   Ulnar tunnel syndrome of right wrist 11/08/2012    Assessment: Anthony [name]. is a 67 y.o. year old male admitted on 07/07/2023 with concern for PE. CTA with acute PE with moderate embolic burden. No anticoagulation prior to admission. Prescribed eliquis for history of PE and afib with previous stroke. Afib RVR in ED which has been controlled with Cardizem drip. Pharmacy consulted to dose heparin.  Previous heparin level slightly subtherapeutic at 0.22 and s/p rate increase to 1100 units/hr heparin level undetectable, stat re-draw to confirm which shows a level of 0.24 instead of undetectable  Goal of Therapy:  Heparin level 0.3-0.7 units/ml Monitor  platelets by anticoagulation protocol: Yes   Plan:  Increase heparin gtt to 1300 units/hr F/u 6 hour heparin level F/u long term [facility] plan  Daylene Posey, PharmD, [facility] Clinical Pharmacist ED Pharmacist Phone # [phone] 07/08/2023 2:43 PM

## 2023-07-08 NOTE — Progress Notes (Addendum)
PROGRESS NOTE    Anthony Makaylie Dedeaux Honse Jr.  UJW:[id number] DOB: [birth year] DOA: 07/07/2023 PCP: Pcp, No  Chief Complaint  Patient presents with   Leg Swelling    Brief Narrative:   Anthony [name]. is Anthony [name] 67 y.o. male with medical history significant of alcohol abuse, hypertension, homelessness, CVA, chronic HFmrEF, Cory Rama-fib, history of PE in July 2024, hyperlipidemia, chronic hypotension, anxiety, depression, chronic low back pain, marijuana and tobacco abuse presented to the ED with complaints of shortness of breath and bilateral lower extremity edema.  Noted to be in [name] with RVR with rate in the 140s when ambulating and 120s at rest.  Also noted to be intoxicated/ethanol on board per EMS.  He was found to have acute PE on imaging.   See below for additional details  Assessment & Plan:   Principal Problem:   Acute pulmonary embolism (HCC) Active Problems:   Hypomagnesemia   Atrial fibrillation with rapid ventricular response (HCC)   Acute on chronic heart failure (HCC)   Hyponatremia   Hypoglycemia   Lactic acidosis  Recurrent PE History of previous PE in July 2024 and nonadherent to Eliquis therapy due to social issues/homelessness.   CTA chest showing acute PE with moderate embolic burden, stable relative dilation of the right heart chambers, and equivocal evidence for progressive right heart strain.  Echo pending LE [place] pending Continue on heparin gtt for now.  Of note, he's [name] fall risk with his chronic etoh abuse (notes "multiple concussions" this past year, not exactly clear how frequently or severity - or if real diagnosis, but he certainly is fall risk with chronic etoh use -> will get head CT to look for sequelae of prior head trauma).  Hx of multiple PE warrants indefinite anticoagulation, but chronic etoh use and homelessness provide additional issues to consider with regards to risk and ability to adhere to any regimen.    Alcohol abuse Significant daily use Chart hx DT's, I  don't see any chart hx seizures Looks like he did ok with librium taper last admission, will try this again, CIWA protocol  Paroxysmal [name] with RVR Rate controlled, resume home metoprolol  Acute on chronic biventricular heart failure Moderate to severe MR In the setting of medication nonadherence.  BNP 553.  CT showing moderate right pleural effusion.  echo done in July 2024 showing EF 40 to 45%, severely reduced RV systolic function, severe biatrial dilation, and moderate to severe MR.  Repeat echo pending Will continue lasix PO as tolerated Strict I/O, daily weights   Subpleural Ground Glass Pulmonary Infiltrate within Left Upper Lobe Infectious vs inflammatory Clinically afebrile and no white count Will hold antibiotics    Mild hypervolemic hyponatremia Continue PO lasix    Mild lactic acidosis downtrending   High anion gap metabolic acidosis Likely related to lactic acidosis +/- etoh use  Improved  Mild hypoglycemia Encourage PO intake   Hypomagnesemia Trend and follow - replace prn   Chronic anemia Hemoglobin stable, continue to monitor labs. Anemia labs   Hypertension BP appropriate, watch with lasix    History of CVA Hyperlipidemia Continue Crestor  Concern for frequent falls PT/OT    DVT prophylaxis: heparin gtt Code Status: full Family Communication: none Disposition:   Status is: Inpatient Remains inpatient appropriate because: need for continued inpatient care   Consultants:  none  Procedures:  none  Antimicrobials:  Anti-infectives (From admission, onward)    None       Subjective: Feels better than when  he first came in   Objective: Vitals:   07/08/23 0715 07/08/23 0725 07/08/23 0811 07/08/23 0900  BP: 119/77  120/84 131/79  Pulse: 73 67 82 82  Resp:  17 17 18   Temp:   97.6 F (36.4 C)   TempSrc:      SpO2: 95% 98% 97% 99%  Weight:      Height:        Intake/Output Summary (Last 24 hours) at 07/08/2023 0914 Last  data filed at 07/08/2023 4098 Gross per 24 hour  Intake --  Output 2650 ml  Net -2650 ml   Filed Weights   07/07/23 1944  Weight: 63.5 kg    Examination:  General exam: Appears calm and comfortable  Respiratory system: unlabored Cardiovascular system: irregularly irregular, rate controlled Central nervous system: Alert and oriented. No focal neurological deficits. Extremities: mild bilateral LE edema   Data Reviewed: I have personally reviewed following labs and imaging studies  CBC: Recent Labs  Lab 07/07/23 1955 07/08/23 0422  WBC 4.1 4.0  NEUTROABS 1.8  --   HGB 10.0* 9.4*  HCT 31.4* 28.4*  MCV 99.4 95.6  PLT 190 179    Basic Metabolic Panel: Recent Labs  Lab 07/07/23 1955 07/08/23 0422  NA 132* 133*  K 4.5 3.3*  CL 101 102  CO2 15* 21*  GLUCOSE 69* 72  BUN 13 10  CREATININE 0.80 0.78  CALCIUM 8.7* 8.2*  MG 1.3* 1.2*    GFR: Estimated Creatinine Clearance: 80.5 mL/min (by C-G formula based on SCr of 0.78 mg/dL).  Liver Function Tests: Recent Labs  Lab 07/08/23 0422  AST 47*  ALT 20  ALKPHOS 134*  BILITOT 1.6*  PROT 6.5  ALBUMIN 3.0*    CBG: Recent Labs  Lab 07/08/23 0420 07/08/23 0809  GLUCAP 78 78     No results found for this or any previous visit (from the past 240 hours).       Radiology Studies: CT Angio Chest PE W and/or Wo Contrast Result Date: 07/07/2023 CLINICAL DATA:  High probability pulmonary embolism, tachycardia, leg swelling EXAM: CT ANGIOGRAPHY CHEST WITH CONTRAST TECHNIQUE: Multidetector CT imaging of the chest was performed using the standard protocol during bolus administration of intravenous contrast. Multiplanar CT image reconstructions and MIPs were obtained to evaluate the vascular anatomy. RADIATION DOSE REDUCTION: This exam was performed according to the departmental dose-optimization program which includes automated exposure control, adjustment of the mA and/or kV according to patient size and/or use of  iterative reconstruction technique. CONTRAST:  75mL OMNIPAQUE IOHEXOL 350 MG/ML SOLN COMPARISON:  None Available. FINDINGS: Cardiovascular: There is adequate opacification of the pulmonary arterial tree. In intraluminal filling defect is identified within the right lower lobar pulmonary artery in keeping with an acute pulmonary embolus. The embolic burden is moderate. The central pulmonary arteries are enlarged in keeping with changes of pulmonary arterial hypertension. There is reversal of normal RV/LV ratio, (RV/LV = 1.2), however, this is stable since prior examination 03/06/2023 and there is therefore equivocal evidence for progressive right heart strain. There is mild cardiomegaly with dilation of the right heart chambers and reflux of contrast into the hepatic venous system in keeping elevated right heart pressure and some degree of right heart failure. Moderate coronary artery calcification. No pericardial effusion. Thoracic aorta is of normal caliber. Minimal atherosclerotic calcification within the descending thoracic aorta. Mediastinum/Nodes: Shotty right hilar and mediastinal adenopathy may be reactive in nature. No frankly pathologic adenopathy within the abdomen and pelvis. Esophagus unremarkable. Visualized  thyroid is unremarkable. Lungs/Pleura: Moderate right pleural effusion. Compressive atelectasis of the right lower lobe. Subpleural ground-glass pulmonary infiltrate within the left upper lobe, axial image # 50/5 is nonspecific, possibly infectious or inflammatory in the acute setting. No pneumothorax. No pleural effusion on the left. Small layering debris within the right bronchus intermedius. Upper Abdomen: No acute abnormality. Musculoskeletal: Osseous structures are age-appropriate. No acute bone abnormality. Multiple healed bilateral rib fractures are again identified. Review of the MIP images confirms the above findings. IMPRESSION: 1. Acute pulmonary embolus with moderate embolic burden.  Stable relative dilation of the right heart chambers and equivocal evidence for progressive right heart strain as described above. 2. Mild cardiomegaly with dilation of the right heart chambers and reflux of contrast into the hepatic venous system in keeping with elevated right heart pressure and some degree of right heart failure. 3. Moderate coronary artery calcification. 4. Moderate right pleural effusion with compressive atelectasis of the right lower lobe. 5. Subpleural ground-glass pulmonary infiltrate within the left upper lobe, nonspecific, possibly infectious or inflammatory in the acute setting. 6. Shotty right hilar and mediastinal adenopathy, likely reactive in nature. Aortic Atherosclerosis (ICD10-I70.0). Electronically Signed   By: Helyn Numbers M.D.   On: 07/07/2023 22:25   DG Chest 2 View Result Date: 07/07/2023 CLINICAL DATA:  Dyspnea EXAM: CHEST - 2 VIEW COMPARISON:  03/08/2023 FINDINGS: Left costophrenic angle is partially excluded. Small right pleural effusion. There is progressive central pulmonary vascular congestion without overt pulmonary edema. No pneumothorax. Mild left basilar atelectasis. Cardiac size within normal limits. No acute bone abnormality. IMPRESSION: 1. Progressive central pulmonary vascular congestion without overt pulmonary edema. 2. New small right pleural effusion. Electronically Signed   By: Helyn Numbers M.D.   On: 07/07/2023 22:14        Scheduled Meds:  folic acid  1 mg Oral Daily   multivitamin with minerals  1 tablet Oral Daily   rosuvastatin  10 mg Oral Daily   thiamine  100 mg Oral Daily   Or   thiamine  100 mg Intravenous Daily   Continuous Infusions:  diltiazem (CARDIZEM) infusion 5 mg/hr (07/08/23 0906)   heparin 1,100 Units/hr (07/08/23 0906)     LOS: 0 days    Time spent: over 30 min    Lacretia Nicks, MD [facility]   To contact the attending provider between 7A-7P or the covering provider during after hours 7P-7A,  please log into the web site [email or web address] and access using universal Section password for that web site. If you do not have the password, please call the hospital operator.  07/08/2023, 9:14 AM

## 2023-07-08 NOTE — Plan of Care (Signed)
  Problem: Pain Management: Goal: General experience of comfort will improve Outcome: Progressing   Problem: Safety: Goal: Ability to remain free from injury will improve Outcome: Progressing   Problem: Skin Integrity: Goal: Risk for impaired skin integrity will decrease Outcome: Progressing

## 2023-07-09 ENCOUNTER — Other Ambulatory Visit (HOSPITAL_COMMUNITY): Payer: Self-pay

## 2023-07-09 ENCOUNTER — Telehealth (HOSPITAL_COMMUNITY): Payer: Self-pay

## 2023-07-09 DIAGNOSIS — I2699 Other pulmonary embolism without acute cor pulmonale: Secondary | ICD-10-CM | POA: Diagnosis not present

## 2023-07-09 LAB — CBC WITH DIFFERENTIAL/PLATELET
Abs Immature Granulocytes: 0.01 10*3/uL (ref 0.00–0.07)
Basophils Absolute: 0.1 10*3/uL (ref 0.0–0.1)
Basophils Relative: 2 %
Eosinophils Absolute: 0.1 10*3/uL (ref 0.0–0.5)
Eosinophils Relative: 3 %
HCT: 30.5 % — ABNORMAL LOW (ref 39.0–52.0)
Hemoglobin: 9.8 g/dL — ABNORMAL LOW (ref 13.0–17.0)
Immature Granulocytes: 0 %
Lymphocytes Relative: 36 %
Lymphs Abs: 1.4 10*3/uL (ref 0.7–4.0)
MCH: 30.9 pg (ref 26.0–34.0)
MCHC: 32.1 g/dL (ref 30.0–36.0)
MCV: 96.2 fL (ref 80.0–100.0)
Monocytes Absolute: 0.6 10*3/uL (ref 0.1–1.0)
Monocytes Relative: 16 %
Neutro Abs: 1.6 10*3/uL — ABNORMAL LOW (ref 1.7–7.7)
Neutrophils Relative %: 43 %
Platelets: 217 10*3/uL (ref 150–400)
RBC: 3.17 MIL/uL — ABNORMAL LOW (ref 4.22–5.81)
RDW: 21 % — ABNORMAL HIGH (ref 11.5–15.5)
WBC: 3.8 10*3/uL — ABNORMAL LOW (ref 4.0–10.5)
nRBC: 0 % (ref 0.0–0.2)

## 2023-07-09 LAB — GLUCOSE, CAPILLARY
Glucose-Capillary: 100 mg/dL — ABNORMAL HIGH (ref 70–99)
Glucose-Capillary: 104 mg/dL — ABNORMAL HIGH (ref 70–99)
Glucose-Capillary: 117 mg/dL — ABNORMAL HIGH (ref 70–99)
Glucose-Capillary: 150 mg/dL — ABNORMAL HIGH (ref 70–99)
Glucose-Capillary: 169 mg/dL — ABNORMAL HIGH (ref 70–99)
Glucose-Capillary: 92 mg/dL (ref 70–99)

## 2023-07-09 LAB — COMPREHENSIVE METABOLIC PANEL
ALT: 18 U/L (ref 0–44)
AST: 40 U/L (ref 15–41)
Albumin: 2.6 g/dL — ABNORMAL LOW (ref 3.5–5.0)
Alkaline Phosphatase: 124 U/L (ref 38–126)
Anion gap: 8 (ref 5–15)
BUN: 13 mg/dL (ref 8–23)
CO2: 23 mmol/L (ref 22–32)
Calcium: 8 mg/dL — ABNORMAL LOW (ref 8.9–10.3)
Chloride: 103 mmol/L (ref 98–111)
Creatinine, Ser: 0.86 mg/dL (ref 0.61–1.24)
GFR, Estimated: >60 mL/min (ref >60)
Glucose, Bld: 90 mg/dL (ref 70–99)
Potassium: 3.5 mmol/L (ref 3.5–5.1)
Sodium: 134 mmol/L — ABNORMAL LOW (ref 135–145)
Total Bilirubin: 1 mg/dL (ref <1.2)
Total Protein: 5.7 g/dL — ABNORMAL LOW (ref 6.5–8.1)

## 2023-07-09 LAB — MAGNESIUM: Magnesium: 1.5 mg/dL — ABNORMAL LOW (ref 1.7–2.4)

## 2023-07-09 LAB — HEPARIN LEVEL (UNFRACTIONATED): Heparin Unfractionated: 0.28 [IU]/mL — ABNORMAL LOW (ref 0.30–0.70)

## 2023-07-09 LAB — PHOSPHORUS: Phosphorus: 3.6 mg/dL (ref 2.5–4.6)

## 2023-07-09 MED ORDER — METHYLPREDNISOLONE SODIUM SUCC 40 MG IJ SOLR
40.0000 mg | Freq: Every day | INTRAMUSCULAR | Status: DC
  Administered 2023-07-09 – 2023-07-10 (×2): 40 mg via INTRAVENOUS
  Filled 2023-07-09 (×2): qty 1

## 2023-07-09 MED ORDER — HALOPERIDOL LACTATE 5 MG/ML IJ SOLN: 2.0000 mg | Freq: Four times a day (QID) | INTRAMUSCULAR | Status: DC | PRN

## 2023-07-09 MED ORDER — APIXABAN 5 MG PO TABS: 5.0000 mg | ORAL_TABLET | Freq: Two times a day (BID) | ORAL | Status: DC

## 2023-07-09 MED ORDER — LEVALBUTEROL HCL 1.25 MG/0.5ML IN NEBU
1.2500 mg | INHALATION_SOLUTION | Freq: Four times a day (QID) | RESPIRATORY_TRACT | Status: DC
  Filled 2023-07-09 (×2): qty 0.5

## 2023-07-09 MED ORDER — HALOPERIDOL 1 MG PO TABS
2.0000 mg | ORAL_TABLET | Freq: Four times a day (QID) | ORAL | Status: DC | PRN
  Administered 2023-07-10 – 2023-07-12 (×3): 2 mg via ORAL
  Filled 2023-07-09 (×5): qty 2

## 2023-07-09 MED ORDER — LEVALBUTEROL HCL 1.25 MG/0.5ML IN NEBU
1.2500 mg | INHALATION_SOLUTION | Freq: Four times a day (QID) | RESPIRATORY_TRACT | Status: DC | PRN
  Administered 2023-07-09: 1.25 mg via RESPIRATORY_TRACT
  Filled 2023-07-09 (×2): qty 0.5

## 2023-07-09 MED ORDER — APIXABAN 5 MG PO TABS
10.0000 mg | ORAL_TABLET | Freq: Two times a day (BID) | ORAL | Status: DC
  Administered 2023-07-09 – 2023-07-11 (×6): 10 mg via ORAL
  Filled 2023-07-09 (×7): qty 2

## 2023-07-09 NOTE — Progress Notes (Signed)
PHARMACY - ANTICOAGULATION CONSULT NOTE  Pharmacy Consult for IV heparin transition to eliquis Indication: pulmonary embolus  Allergies  Allergen Reactions   Other Itching and Other (See Comments)    Seasonal allergies- Itchy eyes, runny nose, congestion   Poison Ivy Extract Rash    Patient Measurements: Height: 5\' 8"  (172.7 cm) Weight: 63.5 kg (140 lb) IBW/kg (Calculated) : 68.4 Heparin Dosing Weight: 63.5 kg  Vital Signs: Temp: 98.5 F (36.9 C) (12/20 0800) Temp Source: Oral (12/20 0800) BP: 108/71 (12/20 0800) Pulse Rate: 82 (12/20 0800)  Labs: Recent Labs    07/07/23 1955 07/07/23 2300 07/08/23 0422 07/08/23 1122 07/08/23 1335 07/08/23 2147 07/09/23 0724  HGB 10.0*  --  9.4*  --   --   --  9.8*  HCT 31.4*  --  28.4*  --   --   --  30.5*  PLT 190  --  179  --   --   --  217  HEPARINUNFRC  --   --  0.22*   < > 0.24* 0.22* 0.28*  CREATININE 0.80  --  0.78  --   --   --  0.86  TROPONINIHS 14 11  --   --   --   --   --    < > = values in this interval not displayed.    Estimated Creatinine Clearance: 74.9 mL/min (by C-G formula based on SCr of 0.86 mg/dL).  Assessment: Anthony A Coatney Montez Hageman. is a 67 y.o. year old male admitted on 07/07/2023 with concern for PE. CTA with acute PE with moderate embolic burden. No anticoagulation prior to admission. Prescribed eliquis for history of PE and afib with previous stroke but was nonadherent due to cost/homeless. Afib RVR in ED which has been controlled with Cardizem drip. Pharmacy consulted to dose heparin.  Heparin level slightly subtherapeutic at 0.28 with heparin infusion at 1450 units/hour. Hgb 9.8, plt 217, stable. Per RN, no issues with the heparin infusion or signs/symptoms of bleeding.  Plan to transition to Eliquis today 12/20  Goal of Therapy:  Heparin level 0.3-0.7 units/ml Monitor platelets by anticoagulation protocol: Yes   Plan:  Stop heparin infusion Initiate Eliquis 10mg  PO BID x7 days followed by 5mg  PO  BID Monitor CBC and signs/symptoms of bleeding Will follow up medication cost and discharge plan  Stephenie Acres, PharmD PGY1 Pharmacy Resident 07/09/2023 9:20 AM

## 2023-07-09 NOTE — Plan of Care (Signed)
  Problem: Elimination: Goal: Will not experience complications related to urinary retention Outcome: Progressing   Problem: Pain Management: Goal: General experience of comfort will improve Outcome: Progressing   Problem: Safety: Goal: Ability to remain free from injury will improve Outcome: Progressing   Problem: Skin Integrity: Goal: Risk for impaired skin integrity will decrease Outcome: Progressing

## 2023-07-09 NOTE — Progress Notes (Signed)
Pt stating he needs to speak to this RN in a different room "without them". RN at bedside with door open.   Pt states "I may or may not be civil, but what I say is going to happen will happen so get these wires off of me". Pt refusing tele. CCMD and Grier Mitts, MD notified.   Pt says "I need to get out of here and get my legal team and talk with people who are on my side, not the hospital people or government people. Everyone will be free except me. I need to go somewhere where they can't find me."  See [facility] for PRN Ativan admin per CIWA protocol.   Grier Mitts., MD notified of events and to bedside.

## 2023-07-09 NOTE — ED Provider Notes (Signed)
EMERGENCY DEPARTMENT [place] CARDIAC EXAM "Study: Limited Ultrasound of the Heart and Pericardium"  INDICATIONS:Abnormal vital signs and Dyspnea Multiple views of the heart and pericardium were obtained in real-time with a multi-frequency probe.  PERFORMED KG:URKYHC IMAGES ARCHIVED?: Yes LIMITATIONS:  None VIEWS USED: Parasternal long axis, Parasternal short axis, and Apical 4 chamber  INTERPRETATION: Cardiac activity present, Pericardial effusioin absent, Cardiac tamponade absent, and Normal contractility. RV is enlarged compared to left. No McConnell's sign. Septum not flattened toward LV.    Loetta Rough, MD 07/09/23 [phone]

## 2023-07-09 NOTE — Progress Notes (Signed)
Pt refusing orthostatic VS at this time. Grier Mitts, MD notified.

## 2023-07-09 NOTE — Evaluation (Signed)
Physical Therapy Evaluation Patient Details Name: Anthony A Wass Jr. MRN: [id number] DOB: 03-26-1956 Today's Date: 07/09/2023  History of Present Illness  67 y.o. male presents to [facility] hospital on 07/07/2023 with SOB and BLE edema. Pt found to be in afib with RVR, also ETOH positive.CTA with findings of PE. Pt also with moderate R pleural effusion and LUL groundglass pulmonary infiltrate. PMH includes alcohol abuse, HTN, CVA, HFmrEF, afib, PE, HLD, anxiety, depression, chronic low back pain.  Clinical Impression  Pt presents to PT with deficits in activity tolerance, balance, gait, power. Pt is able to ambulate for household distances without UE support at this time, demonstrating 2 instances of lateral instability. Pt will benefit from support of a rollator to improve balance and allow for energy conservation in the community. Pt's vitals remain stable throughout session, HR 91-101 and SpO2 95% and above. 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 Pt seems most concerned about finding housing due to colder winter weather. PT will continue to follow during admission.      If plan is discharge home, recommend the following: Assist for transportation   Can travel by private vehicle        Equipment Recommendations Rollator (4 wheels)  Recommendations for Other Services       Functional Status Assessment Patient has had a recent decline in their functional status and demonstrates the ability to make significant improvements in function in a reasonable and predictable amount of time.     Precautions / Restrictions Precautions Precautions: Fall Restrictions Weight Bearing Restrictions Per Provider Order: No      Mobility  Bed Mobility Overal bed mobility: Modified Independent                  Transfers Overall transfer level: Needs assistance Equipment used: None Transfers: Sit to/from Stand Sit to  Stand: Supervision                Ambulation/Gait Ambulation/Gait assistance: Supervision Gait Distance (Feet): 150 Feet Assistive device: None Gait Pattern/deviations: Step-through pattern, Staggering right Gait velocity: reduced Gait velocity interpretation: 1.31 - 2.62 ft/sec, indicative of limited community ambulator   General Gait Details: pt with 2 instances of staggering to R side during session, self-corrects with stepping strategy  Stairs            Wheelchair Mobility     Tilt Bed    Modified Rankin (Stroke Patients Only)       Balance Overall balance assessment: Needs assistance Sitting-balance support: No upper extremity supported, Feet supported Sitting balance-Leahy Scale: Fair     Standing balance support: No upper extremity supported, During functional activity Standing balance-Leahy Scale: Fair                               Pertinent Vitals/Pain Pain Assessment Pain Assessment: Faces Faces Pain Scale: Hurts a Mckinlay bit Pain Location: chronic pain, generlized Pain Descriptors / Indicators: Aching Pain Intervention(s): Monitored during session    Home Living Family/patient expects to be discharged to:: Shelter/Homeless                   Additional Comments: pt reports he was recently living at a motel, reports he will be going to jail once leaving here as he plans to refuse to leave the hospital    Prior Function Prior Level of Function :  Independent/Modified Independent             Mobility Comments: ambulating without DME recently as he lost all of his prior equipment ADLs Comments: He reported being independent with ADLs, given housing insecurity and that he has to do for himself.     Extremity/Trunk Assessment   Upper Extremity Assessment Upper Extremity Assessment: Overall WFL for tasks assessed    Lower Extremity Assessment Lower Extremity Assessment: Overall WFL for tasks assessed    Cervical /  Trunk Assessment Cervical / Trunk Assessment: Kyphotic  Communication   Communication Communication: Hearing impairment Cueing Techniques: Verbal cues  Cognition Arousal: Alert Behavior During Therapy: WFL for tasks assessed/performed Overall Cognitive Status: Within Functional Limits for tasks assessed                                          General Comments General comments (skin integrity, edema, etc.): VSS on RA, HR ranging from 91-101, SpO2 95% and above during session    Exercises     Assessment/Plan    PT Assessment Patient needs continued PT services  PT Problem List Decreased activity tolerance;Decreased balance;Decreased mobility;Decreased knowledge of use of DME;Cardiopulmonary status limiting activity       PT Treatment Interventions DME instruction;Gait training;Functional mobility training;Stair training;Therapeutic activities;Therapeutic exercise;Balance training;Neuromuscular re-education;Patient/family education    PT Goals (Current goals can be found in the Care Plan section)  Acute Rehab PT Goals Patient Stated Goal: to improve activity tolerance PT Goal Formulation: With patient Time For Goal Achievement: 07/23/23 Potential to Achieve Goals: Fair Additional Goals Additional Goal #1: Pt will score >19/24 on the DGI to indicate a reduced risk for falls    Frequency Min 1X/week     Co-evaluation               AM-PAC PT "6 Clicks" Mobility  Outcome Measure Help needed turning from your back to your side while in a flat bed without using bedrails?: None Help needed moving from lying on your back to sitting on the side of a flat bed without using bedrails?: None Help needed moving to and from a bed to a chair (including a wheelchair)?: A Makar Help needed standing up from a chair using your arms (e.g., wheelchair or bedside chair)?: A Dishner Help needed to walk in hospital room?: A Olson Help needed climbing 3-5 steps with a  railing? : A Bosch 6 Click Score: 20    End of Session Equipment Utilized During Treatment: Gait belt Activity Tolerance: Patient tolerated treatment well Patient left: in bed;with call bell/phone within reach;with bed alarm set Nurse Communication: Mobility status PT Visit Diagnosis: Other abnormalities of gait and mobility (R26.89);Muscle weakness (generalized) (M62.81);Pain    Time: 1043-1105 PT Time Calculation (min) (ACUTE ONLY): 22 min   Charges:   PT Evaluation $PT Eval Low Complexity: 1 Low   PT General Charges $$ ACUTE PT VISIT: 1 Visit         Arlyss Gandy, PT, DPT Acute Rehabilitation Office [phone]   Arlyss Gandy 07/09/2023, 11:20 AM

## 2023-07-09 NOTE — Progress Notes (Addendum)
PROGRESS NOTE    Anthony Caid Radin Trauger Jr.  ZOX:[id number] DOB: 09/04/1955 DOA: 07/07/2023 PCP: Pcp, No  Chief Complaint  Patient presents with   Leg Swelling    Brief Narrative:   [name] is Anthony [name] 67 y.o. male with medical history significant of alcohol abuse, hypertension, homelessness, CVA, chronic HFmrEF, Terique Kawabata-fib, history of PE in July 2024, hyperlipidemia, chronic hypotension, anxiety, depression, chronic low back pain, marijuana and tobacco abuse presented to the ED with complaints of shortness of breath and bilateral lower extremity edema.  Noted to be in [name] with RVR with rate in the 140s when ambulating and 120s at rest.  Also noted to be intoxicated/ethanol on board per EMS.  He was found to have acute PE on imaging.   See below for additional details  Assessment & Plan:   Principal Problem:   Acute pulmonary embolism (HCC) Active Problems:   Hypomagnesemia   Atrial fibrillation with rapid ventricular response (HCC)   Acute on chronic heart failure (HCC)   Hyponatremia   Hypoglycemia   Lactic acidosis    Recurrent PE History of previous PE in July 2024 and nonadherent to Eliquis therapy due to social issues/homelessness.   CTA chest showing acute PE with moderate embolic burden, stable relative dilation of the right heart chambers, and equivocal evidence for progressive right heart strain.  Echo with improved EF and RV function.  Hypokinesis of inferior wall from base to mid wall.  EF 45-50%, mildly reduced RVSF.  Moderately enlarged RV.  See report. LE [place] without DVT Transition to eliquis today.  Of note, he's [name] fall risk with his chronic etoh abuse (notes "multiple concussions" this past year, not exactly clear how frequently or severity - or if real diagnosis, but he certainly is fall risk with chronic etoh use -> head CT - no acute findings).  Hx of multiple PE warrants indefinite anticoagulation, but chronic etoh use and homelessness provide additional issues to consider  with regards to risk and ability to adhere to any regimen.    Alcohol abuse Significant daily use Chart hx DT's, I don't see any chart hx seizures Looks like he did ok with librium taper last admission, will try this again, CIWA protocol  Paroxysmal Brighten Orndoff-fib with RVR Rate controlled, home metoprolol eliquis  Acute on chronic biventricular heart failure Moderate to severe MR In the setting of medication nonadherence.  BNP 553.  CT showing moderate right pleural effusion.  echo done in July 2024 showing EF 40 to 45%, severely reduced RV systolic function, severe biatrial dilation, and moderate to severe MR.  Repeat echo as above with improved EF and RV function (see report) Will continue lasix PO as tolerated Strict I/O, daily weights   Subpleural Ground Glass Pulmonary Infiltrate within Left Upper Lobe Infectious vs inflammatory Clinically afebrile and no white count Will hold antibiotics  Warrants outpatient follow up   Hypotension Follow orthostatics Holding parameters for metoprolol   Mild hypervolemic hyponatremia Lasix on hold due to blood pressure   Mild lactic acidosis downtrending   High anion gap metabolic acidosis Likely related to lactic acidosis +/- etoh use  resolved  Mild hypoglycemia resolved   Hypomagnesemia Trend and follow - replace prn   Chronic anemia Hemoglobin stable, continue to monitor labs. Anemia labs   Hypertension Holding parameters for metop Holding lasix    History of CVA Hyperlipidemia Continue Crestor  Concern for frequent falls PT/OT    DVT prophylaxis: heparin gtt Code Status: full Family Communication:  none Disposition:   Status is: Inpatient Remains inpatient appropriate because: need for continued inpatient care   Consultants:  none  Procedures:  none  Antimicrobials:  Anti-infectives (From admission, onward)    None       Subjective: Upset about his breakfast being cold He yelled at the RN, I had  to discuss with him that's not appropriate  Objective: Vitals:   07/08/23 2113 07/09/23 0008 07/09/23 0420 07/09/23 0800  BP: 91/67 94/69 (!) 81/59 108/71  Pulse: 66 69 74 82  Resp: 17 20 20 19   Temp: 98.4 F (36.9 C) 98.5 F (36.9 C) 98.3 F (36.8 C) 98.5 F (36.9 C)  TempSrc: Oral Oral Oral Oral  SpO2: 97% 90% 93% 99%  Weight:      Height:        Intake/Output Summary (Last 24 hours) at 07/09/2023 0943 Last data filed at 07/09/2023 0529 Gross per 24 hour  Intake 588.49 ml  Output 300 ml  Net 288.49 ml   Filed Weights   07/07/23 1944  Weight: 63.5 kg    Examination:  General: No acute distress. Cardiovascular: irregularly irregular  Lungs: unlabored Neurological: Alert and oriented 3. Moves all extremities 4 . Cranial nerves II through XII grossly intact. Skin: Warm and dry. No rashes or lesions. Extremities: trace edema   Data Reviewed: I have personally reviewed following labs and imaging studies  CBC: Recent Labs  Lab 07/07/23 1955 07/08/23 0422 07/09/23 0724  WBC 4.1 4.0 3.8*  NEUTROABS 1.8  --  1.6*  HGB 10.0* 9.4* 9.8*  HCT 31.4* 28.4* 30.5*  MCV 99.4 95.6 96.2  PLT 190 179 217    Basic Metabolic Panel: Recent Labs  Lab 07/07/23 1955 07/08/23 0422 07/09/23 0724  NA 132* 133* 134*  K 4.5 3.3* 3.5  CL 101 102 103  CO2 15* 21* 23  GLUCOSE 69* 72 90  BUN 13 10 13   CREATININE 0.80 0.78 0.86  CALCIUM 8.7* 8.2* 8.0*  MG 1.3* 1.2* 1.5*  PHOS  --   --  3.6    GFR: Estimated Creatinine Clearance: 74.9 mL/min (by C-G formula based on SCr of 0.86 mg/dL).  Liver Function Tests: Recent Labs  Lab 07/08/23 0422 07/09/23 0724  AST 47* 40  ALT 20 18  ALKPHOS 134* 124  BILITOT 1.6* 1.0  PROT 6.5 5.7*  ALBUMIN 3.0* 2.6*    CBG: Recent Labs  Lab 07/08/23 1731 07/08/23 2117 07/09/23 0012 07/09/23 0448 07/09/23 0803  GLUCAP 138* 128* 100* 104* 92     Recent Results (from the past 240 hours)  Blood culture (routine x 2)      Status: None (Preliminary result)   Collection Time: 07/07/23  9:39 PM   Specimen: BLOOD LEFT ARM  Result Value Ref Range Status   Specimen Description BLOOD LEFT ARM  Final   Special Requests   Final    BOTTLES DRAWN AEROBIC AND ANAEROBIC Blood Culture adequate volume   Culture   Final    NO GROWTH 2 DAYS Performed at [facility], [address], [place], [place] [address]    Report Status PENDING  Incomplete  Blood culture (routine x 2)     Status: None (Preliminary result)   Collection Time: 07/07/23  9:48 PM   Specimen: BLOOD RIGHT HAND  Result Value Ref Range Status   Specimen Description BLOOD RIGHT HAND  Final   Special Requests   Final    BOTTLES DRAWN AEROBIC AND ANAEROBIC Blood Culture  results may not be optimal due to an inadequate volume of blood received in culture bottles   Culture   Final    NO GROWTH 2 DAYS Performed at [facility], [address] [place], [place] [address]    Report Status PENDING  Incomplete         Radiology Studies: ECHOCARDIOGRAM COMPLETE Result Date: 07/08/2023    ECHOCARDIOGRAM REPORT   Patient Name:   Arlando Karin Pinedo Markoff Jr. Date of Exam: 07/08/2023 Medical Rec #:  [id number]        Height:       68.0 in Accession #:    [id number]       Weight:       140.0 lb Date of Birth:  [birth year]        BSA:          1.756 m Patient Age:    49 years         BP: Patient Gender: M                HR:           86 bpm. Exam Location:  Inpatient Procedure: 2D Echo, Cardiac Doppler and Color Doppler  Referring Phys: [id number] VASUNDHRA RATHORE IMPRESSIONS  1. Hypokinesis of the inferior wall from the base to the mid wall. Left ventricular ejection fraction, by estimation, is 45 to 50%. The left ventricle has mildly decreased function. The left ventricle has no regional wall motion abnormalities. Left ventricular diastolic parameters are indeterminate.  2. Right ventricular systolic function is mildly reduced. The right ventricular size is moderately  enlarged. There is normal pulmonary artery systolic pressure.  3. Left atrial size was moderately dilated.  4. Right atrial size was mild to moderately dilated.  5. The mitral valve is degenerative. Mild to moderate mitral valve regurgitation.  6. The aortic valve was not well visualized. Aortic valve regurgitation is not visualized.  7. The inferior vena cava is normal in size with <50% respiratory variability, suggesting right atrial pressure of 8 mmHg. Conclusion(s)/Recommendation(s): Compared to prior study, MR has improved. EF has improved as well as RV function. FINDINGS  Left Ventricle: Hypokinesis of the inferior wall from the base to the mid wall. Left ventricular ejection fraction, by estimation, is 45 to 50%. The left ventricle has mildly decreased function. The left ventricle has no regional wall motion abnormalities. The left ventricular internal cavity size was normal in size. There is no left ventricular hypertrophy. Left ventricular diastolic parameters are indeterminate. Right Ventricle: The right ventricular size is moderately enlarged. Right ventricular systolic function is mildly reduced. There is normal pulmonary artery systolic pressure. The tricuspid regurgitant velocity is 1.88 m/s, and with an assumed right atrial pressure of 8 mmHg, the estimated right ventricular systolic pressure is 22.1 mmHg. Left Atrium: Left atrial size was moderately dilated. Right Atrium: Right atrial size was mild to moderately dilated. Pericardium: There is no evidence of pericardial effusion. Mitral Valve: The mitral valve is degenerative in appearance. Mild to moderate mitral valve regurgitation. Tricuspid Valve: Tricuspid valve regurgitation is trivial. Aortic Valve: The aortic valve was not well visualized. Aortic valve regurgitation is not visualized. Pulmonic Valve: Pulmonic valve regurgitation is not visualized. Aorta: The aortic root and ascending aorta are structurally normal, with no evidence of  dilitation. Venous: The inferior vena cava is normal in size with less than 50% respiratory variability, suggesting right atrial pressure of 8 mmHg. IAS/Shunts: The interatrial septum was not well visualized.  LEFT VENTRICLE PLAX 2D LVIDd:         4.65 cm      Diastology LVIDs:         4.10 cm      LV e' medial:    8.81 cm/s LV PW:         0.90 cm      LV E/e' medial:  8.5 LV IVS:        0.80 cm      LV e' lateral:   9.79 cm/s LVOT diam:     2.10 cm      LV E/e' lateral: 7.6 LV SV:         41 LV SV Index:   23 LVOT Area:     3.46 cm  LV Volumes (MOD) LV vol d, MOD A2C: 88.1 ml LV vol d, MOD A4C: 107.0 ml LV vol s, MOD A2C: 70.8 ml LV vol s, MOD A4C: 54.5 ml LV SV MOD A2C:     17.3 ml LV SV MOD A4C:     107.0 ml LV SV MOD BP:      39.0 ml RIGHT VENTRICLE RV S prime:     8.49 cm/s LEFT ATRIUM           Index        RIGHT ATRIUM           Index LA diam:      4.80 cm 2.73 cm/m   RA Area:     22.30 cm LA Vol (A4C): 74.4 ml 42.36 ml/m  RA Volume:   69.80 ml  39.74 ml/m  AORTIC VALVE LVOT Vmax:   77.30 cm/s LVOT Vmean:  51.000 cm/s LVOT VTI:    0.118 m  AORTA Ao Root diam: 3.40 cm Ao Asc diam:  3.40 cm MITRAL VALVE               TRICUSPID VALVE MV Area (PHT): 3.56 cm    TR Peak grad:   14.1 mmHg MV Decel Time: 213 msec    TR Vmax:        188.00 cm/s MV E velocity: 74.70 cm/s                            SHUNTS                            Systemic VTI:  0.12 m                            Systemic Diam: 2.10 cm Carolan Clines Electronically signed by Carolan Clines Signature Date/Time: 07/08/2023/5:52:38 PM    Final    CT HEAD WO CONTRAST ( ) Result Date: 07/08/2023 CLINICAL DATA:  Chronic falls, need for anticoagulation EXAM: CT HEAD WITHOUT CONTRAST TECHNIQUE: Contiguous axial images were obtained from the base of the skull through the vertex without intravenous contrast. RADIATION DOSE REDUCTION: This exam was performed according to the departmental dose-optimization program which includes automated exposure control,  adjustment of the mA and/or kV according to patient size and/or use of iterative reconstruction technique. COMPARISON:  02/03/2023 FINDINGS: Brain: No evidence of acute infarction, hemorrhage, mass, mass effect, or midline shift. No hydrocephalus or extra-axial fluid collection. Vascular: No hyperdense vessel. Skull: Negative for fracture or focal lesion. Sinuses/Orbits: No acute finding. Other: The mastoid air cells are well aerated. IMPRESSION: No acute  intracranial process. Electronically Signed   By: Wiliam Ke M.D.   On: 07/08/2023 17:44   VAS [place] LOWER EXTREMITY VENOUS (DVT) Result Date: 07/08/2023  Lower Venous DVT Study Patient Name:  Anthony [name] Jr.  Date of Exam:   07/08/2023 Medical Rec #: [id number]         Accession #:    [id number] Date of Birth: 1955-08-12         Patient Gender: M Patient Age:   36 years Exam Location:  [facility] Procedure:      VAS [place] LOWER EXTREMITY VENOUS (DVT) Referring Phys: Ulyess Blossom RATHORE --------------------------------------------------------------------------------  Indications: Pain, Swelling, and pulmonary embolism.  Comparison Study: 02/04/23 - Negative Performing Technologist: Marilynne Halsted RDMS, RVT  Examination Guidelines: Yitzchak Kothari complete evaluation includes B-mode imaging, spectral Doppler, color Doppler, and power Doppler as needed of all accessible portions of each vessel. Bilateral testing is considered an integral part of Skylin Kennerson complete examination. Limited examinations for reoccurring indications may be performed as noted. The reflux portion of the exam is performed with the patient in reverse Trendelenburg.  +---------+---------------+---------+-----------+----------+--------------+ RIGHT    CompressibilityPhasicitySpontaneityPropertiesThrombus Aging +---------+---------------+---------+-----------+----------+--------------+ CFV      Full           Yes      Yes                                  +---------+---------------+---------+-----------+----------+--------------+ SFJ      Full                                                        +---------+---------------+---------+-----------+----------+--------------+ FV Prox  Full                                                        +---------+---------------+---------+-----------+----------+--------------+ FV Mid   Full                                                        +---------+---------------+---------+-----------+----------+--------------+ FV DistalFull                                                        +---------+---------------+---------+-----------+----------+--------------+ PFV      Full                                                        +---------+---------------+---------+-----------+----------+--------------+ POP      Full           Yes      Yes                                 +---------+---------------+---------+-----------+----------+--------------+  PTV      Full                                                        +---------+---------------+---------+-----------+----------+--------------+ PERO     Full                                                        +---------+---------------+---------+-----------+----------+--------------+   +---------+---------------+---------+-----------+----------+-----------------+ LEFT     CompressibilityPhasicitySpontaneityPropertiesThrombus Aging    +---------+---------------+---------+-----------+----------+-----------------+ CFV      Full           Yes      Yes                                    +---------+---------------+---------+-----------+----------+-----------------+ SFJ      Full                                                           +---------+---------------+---------+-----------+----------+-----------------+ FV Prox  Full                                                            +---------+---------------+---------+-----------+----------+-----------------+ FV Mid   Full                                                           +---------+---------------+---------+-----------+----------+-----------------+ FV DistalFull                                                           +---------+---------------+---------+-----------+----------+-----------------+ PFV      Full                                                           +---------+---------------+---------+-----------+----------+-----------------+ POP      Full           Yes      Yes                                    +---------+---------------+---------+-----------+----------+-----------------+ PTV      Full                                                           +---------+---------------+---------+-----------+----------+-----------------+  PERO                                                  Poorly visualized +---------+---------------+---------+-----------+----------+-----------------+     Summary: RIGHT: - There is no evidence of deep vein thrombosis in the lower extremity.  - No cystic structure found in the popliteal fossa.  LEFT: - There is no evidence of deep vein thrombosis in the lower extremity. However, portions of this examination were limited- see technologist comments above.  *See table(s) above for measurements and observations. Electronically signed by Carolynn Sayers on 07/08/2023 at 12:05:13 PM.    Final    CT Angio Chest PE W and/or Wo Contrast Result Date: 07/07/2023 CLINICAL DATA:  High probability pulmonary embolism, tachycardia, leg swelling EXAM: CT ANGIOGRAPHY CHEST WITH CONTRAST TECHNIQUE: Multidetector CT imaging of the chest was performed using the standard protocol during bolus administration of intravenous contrast. Multiplanar CT image reconstructions and MIPs were obtained to evaluate the vascular anatomy. RADIATION DOSE REDUCTION: This exam was performed  according to the departmental dose-optimization program which includes automated exposure control, adjustment of the mA and/or kV according to patient size and/or use of iterative reconstruction technique. CONTRAST:  75mL OMNIPAQUE IOHEXOL 350 MG/ML SOLN COMPARISON:  None Available. FINDINGS: Cardiovascular: There is adequate opacification of the pulmonary arterial tree. In intraluminal filling defect is identified within the right lower lobar pulmonary artery in keeping with an acute pulmonary embolus. The embolic burden is moderate. The central pulmonary arteries are enlarged in keeping with changes of pulmonary arterial hypertension. There is reversal of normal RV/LV ratio, (RV/LV = 1.2), however, this is stable since prior examination 03/06/2023 and there is therefore equivocal evidence for progressive right heart strain. There is mild cardiomegaly with dilation of the right heart chambers and reflux of contrast into the hepatic venous system in keeping elevated right heart pressure and some degree of right heart failure. Moderate coronary artery calcification. No pericardial effusion. Thoracic aorta is of normal caliber. Minimal atherosclerotic calcification within the descending thoracic aorta. Mediastinum/Nodes: Shotty right hilar and mediastinal adenopathy may be reactive in nature. No frankly pathologic adenopathy within the abdomen and pelvis. Esophagus unremarkable. Visualized thyroid is unremarkable. Lungs/Pleura: Moderate right pleural effusion. Compressive atelectasis of the right lower lobe. Subpleural ground-glass pulmonary infiltrate within the left upper lobe, axial image # 50/5 is nonspecific, possibly infectious or inflammatory in the acute setting. No pneumothorax. No pleural effusion on the left. Small layering debris within the right bronchus intermedius. Upper Abdomen: No acute abnormality. Musculoskeletal: Osseous structures are age-appropriate. No acute bone abnormality. Multiple healed  bilateral rib fractures are again identified. Review of the MIP images confirms the above findings. IMPRESSION: 1. Acute pulmonary embolus with moderate embolic burden. Stable relative dilation of the right heart chambers and equivocal evidence for progressive right heart strain as described above. 2. Mild cardiomegaly with dilation of the right heart chambers and reflux of contrast into the hepatic venous system in keeping with elevated right heart pressure and some degree of right heart failure. 3. Moderate coronary artery calcification. 4. Moderate right pleural effusion with compressive atelectasis of the right lower lobe. 5. Subpleural ground-glass pulmonary infiltrate within the left upper lobe, nonspecific, possibly infectious or inflammatory in the acute setting. 6. Shotty right hilar and mediastinal adenopathy, likely reactive in nature. Aortic Atherosclerosis (ICD10-I70.0). Electronically Signed   By: Gloris Ham  Ramiro Harvest M.D.   On: 07/07/2023 22:25   DG Chest 2 View Result Date: 07/07/2023 CLINICAL DATA:  Dyspnea EXAM: CHEST - 2 VIEW COMPARISON:  03/08/2023 FINDINGS: Left costophrenic angle is partially excluded. Small right pleural effusion. There is progressive central pulmonary vascular congestion without overt pulmonary edema. No pneumothorax. Mild left basilar atelectasis. Cardiac size within normal limits. No acute bone abnormality. IMPRESSION: 1. Progressive central pulmonary vascular congestion without overt pulmonary edema. 2. New small right pleural effusion. Electronically Signed   By: Helyn Numbers M.D.   On: 07/07/2023 22:14        Scheduled Meds:  chlordiazePOXIDE  25 mg Oral TID   Followed by   Melene Muller ON 07/10/2023] chlordiazePOXIDE  25 mg Oral BH-qamhs   Followed by   Melene Muller ON 07/11/2023] chlordiazePOXIDE  25 mg Oral Daily   folic acid  1 mg Oral Daily   influenza vaccine adjuvanted  0.5 mL Intramuscular Tomorrow-1000   metoprolol tartrate  25 mg Oral BID   multivitamin  with minerals  1 tablet Oral Daily   pneumococcal 20-valent conjugate vaccine  0.5 mL Intramuscular Tomorrow-1000   rosuvastatin  10 mg Oral Daily   thiamine  100 mg Oral Daily   Or   thiamine  100 mg Intravenous Daily   Continuous Infusions:  heparin 1,450 Units/hr (07/09/23 0510)     LOS: 1 day    Time spent: over 30 min    Lacretia Nicks, MD [facility]   To contact the attending provider between 7A-7P or the covering provider during after hours 7P-7A, please log into the web site [email or web address] and access using universal [place] password for that web site. If you do not have the password, please call the hospital operator.  07/09/2023, 9:43 AM

## 2023-07-09 NOTE — Progress Notes (Signed)
Pt says whenever he is discharged he is going to "refuse to leave and get arrested since I will be a trespasser so even though I will be in jail at least I will have somewhere to go". Grier Mitts, MD and Antony Blackbird, LCSW made aware.

## 2023-07-09 NOTE — Telephone Encounter (Signed)
Pharmacy Patient Advocate Encounter  Insurance verification completed.    The patient is insured through . Patient has Medicare and is not eligible for a copay card, but may be able to apply for patient assistance, if available.    Ran test claim for Xarelto starter kit and the current 30 day co-pay is $0.00.  Ran test claim for Eliquis starter kit and the current 30 day co-pay is $0.00.  This test claim was processed through [facility]- copay amounts may vary at other pharmacies due to pharmacy/plan contracts, or as the patient moves through the different stages of their insurance plan.

## 2023-07-10 ENCOUNTER — Inpatient Hospital Stay (HOSPITAL_COMMUNITY): Payer: 59

## 2023-07-10 DIAGNOSIS — I2699 Other pulmonary embolism without acute cor pulmonale: Secondary | ICD-10-CM | POA: Diagnosis not present

## 2023-07-10 LAB — VITAMIN B12: Vitamin B-12: 620 pg/mL (ref 180–914)

## 2023-07-10 LAB — CBC
HCT: 32.9 % — ABNORMAL LOW (ref 39.0–52.0)
Hemoglobin: 10.4 g/dL — ABNORMAL LOW (ref 13.0–17.0)
MCH: 31.2 pg (ref 26.0–34.0)
MCHC: 31.6 g/dL (ref 30.0–36.0)
MCV: 98.8 fL (ref 80.0–100.0)
Platelets: 213 10*3/uL (ref 150–400)
RBC: 3.33 MIL/uL — ABNORMAL LOW (ref 4.22–5.81)
RDW: 21 % — ABNORMAL HIGH (ref 11.5–15.5)
WBC: 2.1 10*3/uL — ABNORMAL LOW (ref 4.0–10.5)
nRBC: 0 % (ref 0.0–0.2)

## 2023-07-10 LAB — GLUCOSE, CAPILLARY
Glucose-Capillary: 144 mg/dL — ABNORMAL HIGH (ref 70–99)
Glucose-Capillary: 148 mg/dL — ABNORMAL HIGH (ref 70–99)
Glucose-Capillary: 150 mg/dL — ABNORMAL HIGH (ref 70–99)
Glucose-Capillary: 155 mg/dL — ABNORMAL HIGH (ref 70–99)
Glucose-Capillary: 164 mg/dL — ABNORMAL HIGH (ref 70–99)
Glucose-Capillary: 169 mg/dL — ABNORMAL HIGH (ref 70–99)

## 2023-07-10 LAB — BASIC METABOLIC PANEL WITH GFR
Anion gap: 6 (ref 5–15)
BUN: 16 mg/dL (ref 8–23)
CO2: 23 mmol/L (ref 22–32)
Calcium: 8.1 mg/dL — ABNORMAL LOW (ref 8.9–10.3)
Chloride: 106 mmol/L (ref 98–111)
Creatinine, Ser: 0.7 mg/dL (ref 0.61–1.24)
GFR, Estimated: >60 mL/min (ref >60)
Glucose, Bld: 180 mg/dL — ABNORMAL HIGH (ref 70–99)
Potassium: 3.9 mmol/L (ref 3.5–5.1)
Sodium: 135 mmol/L (ref 135–145)

## 2023-07-10 LAB — IRON AND TIBC
Iron: 23 ug/dL — ABNORMAL LOW (ref 45–182)
Saturation Ratios: 6 % — ABNORMAL LOW (ref 17.9–39.5)
TIBC: 413 ug/dL (ref 250–450)
UIBC: 390 ug/dL

## 2023-07-10 LAB — FERRITIN: Ferritin: 18 ng/mL — ABNORMAL LOW (ref 24–336)

## 2023-07-10 LAB — FOLATE: Folate: 17.9 ng/mL (ref >5.9)

## 2023-07-10 MED ORDER — METOPROLOL TARTRATE 12.5 MG HALF TABLET
12.5000 mg | ORAL_TABLET | Freq: Once | ORAL | Status: AC
  Administered 2023-07-10: 12.5 mg via ORAL
  Filled 2023-07-10: qty 1

## 2023-07-10 MED ORDER — THIAMINE HCL 100 MG/ML IJ SOLN
500.0000 mg | Freq: Three times a day (TID) | INTRAVENOUS | Status: DC
  Administered 2023-07-10 – 2023-07-12 (×6): 500 mg via INTRAVENOUS
  Filled 2023-07-10 (×8): qty 5

## 2023-07-10 MED ORDER — METOPROLOL TARTRATE 25 MG PO TABS
37.5000 mg | ORAL_TABLET | Freq: Two times a day (BID) | ORAL | Status: DC
  Administered 2023-07-10 – 2023-07-11 (×3): 37.5 mg via ORAL
  Filled 2023-07-10 (×4): qty 1

## 2023-07-10 MED ORDER — MAGNESIUM OXIDE -MG SUPPLEMENT 400 (240 MG) MG PO TABS
400.0000 mg | ORAL_TABLET | Freq: Every day | ORAL | Status: DC
  Administered 2023-07-10 – 2023-07-11 (×2): 400 mg via ORAL
  Filled 2023-07-10 (×2): qty 1

## 2023-07-10 MED ORDER — THIAMINE MONONITRATE 100 MG PO TABS: 100.0000 mg | ORAL_TABLET | Freq: Every day | ORAL | Status: DC

## 2023-07-10 MED ORDER — THIAMINE HCL 100 MG/ML IJ SOLN: 250.0000 mg | Freq: Every day | INTRAVENOUS | Status: DC

## 2023-07-10 MED ORDER — FERROUS SULFATE 325 (65 FE) MG PO TABS
325.0000 mg | ORAL_TABLET | ORAL | Status: DC
Start: 2023-07-10 — End: 2023-07-11
  Administered 2023-07-10: 325 mg via ORAL
  Filled 2023-07-10 (×2): qty 1

## 2023-07-10 NOTE — Progress Notes (Signed)
PROGRESS NOTE    Anthony Rivers Gassmann Mckim Jr.  AOZ:[id number] DOB: 03/16/1956 DOA: 07/07/2023 PCP: Pcp, No  Chief Complaint  Patient presents with   Leg Swelling    Brief Narrative:   Anthony [name]. is Anthony [name] 67 y.o. male with medical history significant of alcohol abuse, hypertension, homelessness, CVA, chronic HFmrEF, Sohil Timko-fib, history of PE in July 2024, hyperlipidemia, chronic hypotension, anxiety, depression, chronic low back pain, marijuana and tobacco abuse presented to the ED with complaints of shortness of breath and bilateral lower extremity edema.  Noted to be in Don Tiu-fib with RVR with rate in the 140s when ambulating and 120s at rest.  Also noted to be intoxicated/ethanol on board per EMS.  He was found to have acute PE on imaging.   He's relatively stable at this point.  Following repeat chest imaging today as relative hypotension has limited diuresis.  Follow effusion seen on CT chest, consider thoracentesis for this.  Discharge will be complicated by homelessness.  He's been agitated/diffucult at times during the hospitalization.    See below for additional details  Assessment & Plan:   Principal Problem:   Acute pulmonary embolism (HCC) Active Problems:   Hypomagnesemia   Atrial fibrillation with rapid ventricular response (HCC)   Acute on chronic heart failure (HCC)   Hyponatremia   Hypoglycemia   Lactic acidosis    Received steroids x2 doses inadvertently, will monitor for adverse effects  Recurrent PE History of previous PE in July 2024 and nonadherent to Eliquis therapy due to social issues/homelessness.   CTA chest showing acute PE with moderate embolic burden, stable relative dilation of the right heart chambers, and equivocal evidence for progressive right heart strain.  Echo with improved EF and RV function.  Hypokinesis of inferior wall from base to mid wall.  EF 45-50%, mildly reduced RVSF.  Moderately enlarged RV.  See report. LE [place] without DVT Transition to eliquis today.   Of note, he's Samuel Rittenhouse fall risk with his chronic etoh abuse (notes "multiple concussions" this past year, not exactly clear how frequently or severity - or if real diagnosis, but he certainly is fall risk with chronic etoh use -> head CT - no acute findings).  Hx of multiple PE warrants indefinite anticoagulation, but chronic etoh use and homelessness provide additional issues to consider with regards to risk and ability to adhere to any regimen.  I think he'll have trouble adhering to any regimen given his homelessness and etoh abuse.     Alcohol abuse Significant daily use Chart hx DT's, I don't see any chart hx seizures Looks like he did ok with librium taper last admission, will try this again, CIWA protocol  Cognitive Deficits Suspect he's close to his baseline, but is not oriented - difficult to follow in conversation at times Normal b12, folate.  Follow TSH, RPR. High dose thiamine Delirium precautions Has haldol prn in case of agitation   Paroxysmal Eather Chaires-fib with RVR Rate controlled, home metoprolol eliquis  Acute on chronic biventricular heart failure Moderate to severe MR In the setting of medication nonadherence.  BNP 553.  CT showing moderate right pleural effusion.  echo done in July 2024 showing EF 40 to 45%, severely reduced RV systolic function, severe biatrial dilation, and moderate to severe MR.  Repeat echo as above with improved EF and RV function (see report) Will continue lasix PO as tolerated (currently on hold due to soft BP's) Strict I/O, daily weights   Moderate Right Pleural Effusion Diuresis limited by  soft BP Will repeat CXR, consider thoracentesis  Subpleural Ground Glass Pulmonary Infiltrate within Left Upper Lobe Infectious vs inflammatory Clinically afebrile and no white count Will hold antibiotics  Warrants outpatient follow up   Hypotension Follow orthostatics (he refused these yesterday) BP trending better Holding parameters for metoprolol   Mild  hypervolemic hyponatremia Lasix on hold due to blood pressure   Mild lactic acidosis downtrending   High anion gap metabolic acidosis Likely related to lactic acidosis +/- etoh use  resolved  Mild hypoglycemia resolved   Hypomagnesemia Trend and follow - replace prn   Chronic anemia Iron Deficiency Anemia Hemoglobin stable, continue to monitor labs. Iron def anemia needs further workup outpatient -> consider colonoscopy, etc  Hypertension BP's improved, but have been on soft side Holding parameters for metop Holding lasix    History of CVA Hyperlipidemia Continue Crestor  Concern for frequent falls PT/OT  Homelessness TOC c/s, appreciate recs    DVT prophylaxis: heparin gtt Code Status: full Family Communication: none Disposition:   Status is: Inpatient Remains inpatient appropriate because: need for continued inpatient care   Consultants:  none  Procedures:  none  Antimicrobials:  Anti-infectives (From admission, onward)    None       Subjective: Confused, knows cone, thinks it's novemeber Taking about going to [employer] if he's discharged "he's not going to make it, he's had 3 blood clots, excess water, can barely walk"  Objective: Vitals:   07/09/23 2052 07/10/23 0008 07/10/23 0443 07/10/23 0852  BP: 109/67 (!) 126/111 (!) 101/55 106/72  Pulse: 80 78 76   Resp: 18 18 18 16   Temp: 97.9 F (36.6 C) 97.8 F (36.6 C) 97.7 F (36.5 C) 97.7 F (36.5 C)  TempSrc: Oral Oral Oral Oral  SpO2: 96% 90% 92%   Weight:   70.1 kg   Height:        Intake/Output Summary (Last 24 hours) at 07/10/2023 1114 Last data filed at 07/10/2023 0853 Gross per 24 hour  Intake 440 ml  Output 350 ml  Net 90 ml   Filed Weights   07/07/23 1944 07/10/23 0443  Weight: 63.5 kg 70.1 kg    Examination:  General: No acute distress. Cardiovascular: irregularly irregular, mild tachy Lungs: unlabored Neurological: Alert and oriented 3. Moves all  extremities 4 with equal strength. Cranial nerves II through XII grossly intact. Skin: Warm and dry. No rashes or lesions. Extremities: no LEE  Data Reviewed: I have personally reviewed following labs and imaging studies  CBC: Recent Labs  Lab 07/07/23 1955 07/08/23 0422 07/09/23 0724 07/10/23 0250  WBC 4.1 4.0 3.8* 2.1*  NEUTROABS 1.8  --  1.6*  --   HGB 10.0* 9.4* 9.8* 10.4*  HCT 31.4* 28.4* 30.5* 32.9*  MCV 99.4 95.6 96.2 98.8  PLT 190 179 217 213    Basic Metabolic Panel: Recent Labs  Lab 07/07/23 1955 07/08/23 0422 07/09/23 0724 07/10/23 0250  NA 132* 133* 134* 135  K 4.5 3.3* 3.5 3.9  CL 101 102 103 106  CO2 15* 21* 23 23  GLUCOSE 69* 72 90 180*  BUN 13 10 13 16   CREATININE 0.80 0.78 0.86 0.70  CALCIUM 8.7* 8.2* 8.0* 8.1*  MG 1.3* 1.2* 1.5*  --   PHOS  --   --  3.6  --     GFR: Estimated Creatinine Clearance: 86.7 mL/min (by C-G formula based on SCr of 0.7 mg/dL).  Liver Function Tests: Recent Labs  Lab 07/08/23 0422 07/09/23  0724  AST 47* 40  ALT 20 18  ALKPHOS 134* 124  BILITOT 1.6* 1.0  PROT 6.5 5.7*  ALBUMIN 3.0* 2.6*    CBG: Recent Labs  Lab 07/09/23 1637 07/09/23 2100 07/10/23 0011 07/10/23 0446 07/10/23 0851  GLUCAP 150* 169* 164* 144* 155*     Recent Results (from the past 240 hours)  Blood culture (routine x 2)     Status: None (Preliminary result)   Collection Time: 07/07/23  9:39 PM   Specimen: BLOOD LEFT ARM  Result Value Ref Range Status   Specimen Description BLOOD LEFT ARM  Final   Special Requests   Final    BOTTLES DRAWN AEROBIC AND ANAEROBIC Blood Culture adequate volume   Culture   Final    NO GROWTH 3 DAYS Performed at [facility], [address] [place], [place] [address]    Report Status PENDING  Incomplete  Blood culture (routine x 2)     Status: None (Preliminary result)   Collection Time: 07/07/23  9:48 PM   Specimen: BLOOD RIGHT HAND  Result Value Ref Range Status   Specimen Description BLOOD  RIGHT HAND  Final   Special Requests   Final    BOTTLES DRAWN AEROBIC AND ANAEROBIC Blood Culture results may not be optimal due to an inadequate volume of blood received in culture bottles   Culture   Final    NO GROWTH 3 DAYS Performed at [facility], [address] [place], [place] [address]    Report Status PENDING  Incomplete         Radiology Studies: ECHOCARDIOGRAM COMPLETE Result Date: 07/08/2023    ECHOCARDIOGRAM REPORT   Patient Name:   Anthony Tyshay Adee Boran Jr. Date of Exam: 07/08/2023 Medical Rec #:  [id number]        Height:       68.0 in Accession #:    [id number]       Weight:       140.0 lb Date of Birth:  [birth year]        BSA:          1.756 m Patient Age:    61 years         BP: Patient Gender: M                HR:           86 bpm. Exam Location:  Inpatient Procedure: 2D Echo, Cardiac Doppler and Color Doppler  Referring Phys: [id number] VASUNDHRA RATHORE IMPRESSIONS  1. Hypokinesis of the inferior wall from the base to the mid wall. Left ventricular ejection fraction, by estimation, is 45 to 50%. The left ventricle has mildly decreased function. The left ventricle has no regional wall motion abnormalities. Left ventricular diastolic parameters are indeterminate.  2. Right ventricular systolic function is mildly reduced. The right ventricular size is moderately enlarged. There is normal pulmonary artery systolic pressure.  3. Left atrial size was moderately dilated.  4. Right atrial size was mild to moderately dilated.  5. The mitral valve is degenerative. Mild to moderate mitral valve regurgitation.  6. The aortic valve was not well visualized. Aortic valve regurgitation is not visualized.  7. The inferior vena cava is normal in size with <50% respiratory variability, suggesting right atrial pressure of 8 mmHg. Conclusion(s)/Recommendation(s): Compared to prior study, MR has improved. EF has improved as well as RV function. FINDINGS  Left Ventricle: Hypokinesis of the inferior wall  from the base to  the mid wall. Left ventricular ejection fraction, by estimation, is 45 to 50%. The left ventricle has mildly decreased function. The left ventricle has no regional wall motion abnormalities. The left ventricular internal cavity size was normal in size. There is no left ventricular hypertrophy. Left ventricular diastolic parameters are indeterminate. Right Ventricle: The right ventricular size is moderately enlarged. Right ventricular systolic function is mildly reduced. There is normal pulmonary artery systolic pressure. The tricuspid regurgitant velocity is 1.88 m/s, and with an assumed right atrial pressure of 8 mmHg, the estimated right ventricular systolic pressure is 22.1 mmHg. Left Atrium: Left atrial size was moderately dilated. Right Atrium: Right atrial size was mild to moderately dilated. Pericardium: There is no evidence of pericardial effusion. Mitral Valve: The mitral valve is degenerative in appearance. Mild to moderate mitral valve regurgitation. Tricuspid Valve: Tricuspid valve regurgitation is trivial. Aortic Valve: The aortic valve was not well visualized. Aortic valve regurgitation is not visualized. Pulmonic Valve: Pulmonic valve regurgitation is not visualized. Aorta: The aortic root and ascending aorta are structurally normal, with no evidence of dilitation. Venous: The inferior vena cava is normal in size with less than 50% respiratory variability, suggesting right atrial pressure of 8 mmHg. IAS/Shunts: The interatrial septum was not well visualized.  LEFT VENTRICLE PLAX 2D LVIDd:         4.65 cm      Diastology LVIDs:         4.10 cm      LV e' medial:    8.81 cm/s LV PW:         0.90 cm      LV E/e' medial:  8.5 LV IVS:        0.80 cm      LV e' lateral:   9.79 cm/s LVOT diam:     2.10 cm      LV E/e' lateral: 7.6 LV SV:         41 LV SV Index:   23 LVOT Area:     3.46 cm  LV Volumes (MOD) LV vol d, MOD A2C: 88.1 ml LV vol d, MOD A4C: 107.0 ml LV vol s, MOD A2C: 70.8 ml LV  vol s, MOD A4C: 54.5 ml LV SV MOD A2C:     17.3 ml LV SV MOD A4C:     107.0 ml LV SV MOD BP:      39.0 ml RIGHT VENTRICLE RV S prime:     8.49 cm/s LEFT ATRIUM           Index        RIGHT ATRIUM           Index LA diam:      4.80 cm 2.73 cm/m   RA Area:     22.30 cm LA Vol (A4C): 74.4 ml 42.36 ml/m  RA Volume:   69.80 ml  39.74 ml/m  AORTIC VALVE LVOT Vmax:   77.30 cm/s LVOT Vmean:  51.000 cm/s LVOT VTI:    0.118 m  AORTA Ao Root diam: 3.40 cm Ao Asc diam:  3.40 cm MITRAL VALVE               TRICUSPID VALVE MV Area (PHT): 3.56 cm    TR Peak grad:   14.1 mmHg MV Decel Time: 213 msec    TR Vmax:        188.00 cm/s MV E velocity: 74.70 cm/s  SHUNTS                            Systemic VTI:  0.12 m                            Systemic Diam: 2.10 cm Carolan Clines Electronically signed by Carolan Clines Signature Date/Time: 07/08/2023/5:52:38 PM    Final    CT HEAD WO CONTRAST ( ) Result Date: 07/08/2023 CLINICAL DATA:  Chronic falls, need for anticoagulation EXAM: CT HEAD WITHOUT CONTRAST TECHNIQUE: Contiguous axial images were obtained from the base of the skull through the vertex without intravenous contrast. RADIATION DOSE REDUCTION: This exam was performed according to the departmental dose-optimization program which includes automated exposure control, adjustment of the mA and/or kV according to patient size and/or use of iterative reconstruction technique. COMPARISON:  02/03/2023 FINDINGS: Brain: No evidence of acute infarction, hemorrhage, mass, mass effect, or midline shift. No hydrocephalus or extra-axial fluid collection. Vascular: No hyperdense vessel. Skull: Negative for fracture or focal lesion. Sinuses/Orbits: No acute finding. Other: The mastoid air cells are well aerated. IMPRESSION: No acute intracranial process. Electronically Signed   By: Wiliam Ke M.D.   On: 07/08/2023 17:44        Scheduled Meds:  apixaban  10 mg Oral BID   Followed by   Melene Muller ON  07/16/2023] apixaban  5 mg Oral BID   chlordiazePOXIDE  25 mg Oral BH-qamhs   Followed by   Melene Muller ON 07/11/2023] chlordiazePOXIDE  25 mg Oral Daily   ferrous sulfate  325 mg Oral QODAY   folic acid  1 mg Oral Daily   influenza vaccine adjuvanted  0.5 mL Intramuscular Tomorrow-1000   metoprolol tartrate  25 mg Oral BID   multivitamin with minerals  1 tablet Oral Daily   pneumococcal 20-valent conjugate vaccine  0.5 mL Intramuscular Tomorrow-1000   rosuvastatin  10 mg Oral Daily   thiamine  100 mg Oral Daily   Or   thiamine  100 mg Intravenous Daily   [START ON 07/18/2023] thiamine  100 mg Oral Daily   Continuous Infusions:  thiamine (VITAMIN B1) injection     Followed by   Melene Muller ON 07/13/2023] thiamine (VITAMIN B1) injection       LOS: 2 days    Time spent: over 30 min    Lacretia Nicks, MD [facility]   To contact the attending provider between 7A-7P or the covering provider during after hours 7P-7A, please log into the web site [email or web address] and access using universal [place] password for that web site. If you do not have the password, please call the hospital operator.  07/10/2023, 11:14 AM

## 2023-07-10 NOTE — Evaluation (Signed)
Occupational Therapy Evaluation Patient Details Name: Anthony A Nicolls Jr. MRN: [id number] DOB: [birth year] Today's Date: 07/10/2023   History of Present Illness 67 y.o. male presents to [facility] hospital on 07/07/2023 with SOB and BLE edema. Pt found to be in afib with RVR, also ETOH positive. CTA with findings of PE. Pt also with moderate R pleural effusion and LUL groundglass pulmonary infiltrate. PMH includes alcohol abuse, HTN, CVA, HFmrEF, afib, PE, HLD, anxiety, depression, chronic low back pain.   Clinical Impression   PTA, pt was mod I for ADL and accessed community via public bus systems; housing insecurity. Fair awareness of money management and benefits of his insurance, but overall slower cognitive processing, problem solving, and memory but suspect this to be consistent with baseline. Following one step commands with increased time. Needing up to [facility] for OOB mobility and ADL. Will follow acutely to optimize independence, but do not suspect need for follow up therapy.  Pt with [place] concerns about housing and cold weather. Have reached out to case management/social work to navigate resources.    If plan is discharge home, recommend the following: A [place] help with walking and/or transfers;A Mantell help with bathing/dressing/bathroom;Assistance with cooking/housework;Assist for transportation    Functional Status Assessment  Patient has had a recent decline in their functional status and demonstrates the ability to make significant improvements in function in a reasonable and predictable amount of time.  Equipment Recommendations  Other (comment) (rollator)    Recommendations for Other Services       Precautions / Restrictions Precautions Precautions: Fall Restrictions Weight Bearing Restrictions Per Provider Order: No      Mobility Bed Mobility Overal bed mobility: Modified Independent                  Transfers Overall transfer level: Needs assistance Equipment used:  None Transfers: Sit to/from Stand Sit to Stand: Supervision                  Balance Overall balance assessment: Needs assistance Sitting-balance support: No upper extremity supported, Feet supported Sitting balance-Leahy Scale: Fair     Standing balance support: No upper extremity supported, During functional activity Standing balance-Leahy Scale: Fair                             ADL either performed or assessed with clinical judgement   ADL Overall ADL's : Needs assistance/impaired Eating/Feeding: Independent   Grooming: Contact guard assist;Standing   Upper Body Bathing: Set up;Sitting   Lower Body Bathing: Contact guard assist;Sit to/from stand   Upper Body Dressing : Set up;Sitting   Lower Body Dressing: Contact guard assist;Sit to/from stand Lower Body Dressing Details (indicate cue type and reason): increased time             Functional mobility during ADLs: Contact guard assist       Vision   Additional Comments: not formally assessed; pt denies changes     Perception Perception: Not tested       Praxis Praxis: Not tested       Pertinent Vitals/Pain Pain Assessment Pain Assessment: Faces Faces Pain Scale: Hurts a [place] bit Pain Location: chronic pain, generlized Pain Descriptors / Indicators: Aching     Extremity/Trunk Assessment Upper Extremity Assessment Upper Extremity Assessment: LUE deficits/detail;Right hand dominant LUE Deficits / Details: Pt reports history of laceration to L hand. Decreased flexion of L 2nd digit predominantly at IP and MCP J. Unable  to make full fist. Grip strength functional   Lower Extremity Assessment Lower Extremity Assessment: Defer to PT evaluation   Cervical / Trunk Assessment Cervical / Trunk Assessment: Kyphotic   Communication Communication Communication: Hearing impairment Cueing Techniques: Verbal cues   Cognition Arousal: Alert Behavior During Therapy: WFL for tasks  assessed/performed Overall Cognitive Status: No family/caregiver present to determine baseline cognitive functioning                                 General Comments: WFL for BADL, some decreased health literacy noted with nurse but pt does report he gets vaccinated every year from flu and ETC and aware he has not had this year. Following one step commands, decreased selective/divided attention. Increased time for problem solving. Suspect at baseline     General Comments  VSS on RA; HR Kamarii 120    Exercises     Shoulder Instructions      Home Living Family/patient expects to be discharged to:: Shelter/Homeless                                 Additional Comments: pt reports he was recently living at a motel, reports he will be going to jail once leaving here as he plans to refuse to leave the hospital      Prior Functioning/Environment Prior Level of Function : Independent/Modified Independent             Mobility Comments: ambulating without DME recently as he lost all of his prior equipment ADLs Comments: He reported being independent with ADLs, given housing insecurity and that he has to do for himself.        OT Problem List: Decreased strength;Decreased activity tolerance;Impaired balance (sitting and/or standing);Cardiopulmonary status limiting activity;Decreased cognition      OT Treatment/Interventions: Self-care/ADL training;Therapeutic exercise;DME and/or AE instruction;Therapeutic activities;Patient/family education;Balance training;Cognitive remediation/compensation    OT Goals(Current goals can be found in the care plan section) Acute Rehab OT Goals Patient Stated Goal: stay warm OT Goal Formulation: With patient Time For Goal Achievement: 07/24/23 Potential to Achieve Goals: Good  OT Frequency: Min 1X/week    Co-evaluation              AM-PAC OT "6 Clicks" Daily Activity     Outcome Measure Help from another person eating  meals?: None Help from another person taking care of personal grooming?: A [place] Help from another person toileting, which includes using toliet, bedpan, or urinal?: A [place] Help from another person bathing (including washing, rinsing, drying)?: A [place] Help from another person to put on and taking off regular upper body clothing?: A [place] Help from another person to put on and taking off regular lower body clothing?: A [place] 6 Click Score: 19   End of Session Nurse Communication: Mobility status  Activity Tolerance: Patient tolerated treatment well Patient left: in bed;with call bell/phone within reach;with bed alarm set  OT Visit Diagnosis: Unsteadiness on feet (R26.81);Muscle weakness (generalized) (M62.81);Other symptoms and signs involving cognitive function                Time: 0935-1005 OT Time Calculation (min): 30 min Charges:  OT General Charges $OT Visit: 1 Visit OT Evaluation $OT Eval Low Complexity: 1 Low OT Treatments $Self Care/Home Management : 8-22 mins  Tyler Deis, OTR/L [facility] Acute Rehabilitation Office: [phone]   Karie Kirks  D Gordy Levan 07/10/2023, 10:13 AM

## 2023-07-11 DIAGNOSIS — I2699 Other pulmonary embolism without acute cor pulmonale: Secondary | ICD-10-CM | POA: Diagnosis not present

## 2023-07-11 DIAGNOSIS — Z8673 Personal history of transient ischemic attack (TIA), and cerebral infarction without residual deficits: Secondary | ICD-10-CM

## 2023-07-11 DIAGNOSIS — I5033 Acute on chronic diastolic (congestive) heart failure: Secondary | ICD-10-CM | POA: Diagnosis not present

## 2023-07-11 DIAGNOSIS — F1020 Alcohol dependence, uncomplicated: Secondary | ICD-10-CM | POA: Diagnosis not present

## 2023-07-11 DIAGNOSIS — I4891 Unspecified atrial fibrillation: Secondary | ICD-10-CM | POA: Diagnosis not present

## 2023-07-11 LAB — CBC WITH DIFFERENTIAL/PLATELET
Abs Immature Granulocytes: 0.03 10*3/uL (ref 0.00–0.07)
Basophils Absolute: 0 10*3/uL (ref 0.0–0.1)
Basophils Relative: 0 %
Eosinophils Absolute: 0 10*3/uL (ref 0.0–0.5)
Eosinophils Relative: 0 %
HCT: 31.7 % — ABNORMAL LOW (ref 39.0–52.0)
Hemoglobin: 9.7 g/dL — ABNORMAL LOW (ref 13.0–17.0)
Immature Granulocytes: 0 %
Lymphocytes Relative: 19 %
Lymphs Abs: 1.5 10*3/uL (ref 0.7–4.0)
MCH: 31 pg (ref 26.0–34.0)
MCHC: 30.6 g/dL (ref 30.0–36.0)
MCV: 101.3 fL — ABNORMAL HIGH (ref 80.0–100.0)
Monocytes Absolute: 0.6 10*3/uL (ref 0.1–1.0)
Monocytes Relative: 7 %
Neutro Abs: 5.8 10*3/uL (ref 1.7–7.7)
Neutrophils Relative %: 74 %
Platelets: 239 10*3/uL (ref 150–400)
RBC: 3.13 MIL/uL — ABNORMAL LOW (ref 4.22–5.81)
RDW: 21.6 % — ABNORMAL HIGH (ref 11.5–15.5)
WBC: 7.9 10*3/uL (ref 4.0–10.5)
nRBC: 0 % (ref 0.0–0.2)

## 2023-07-11 LAB — GLUCOSE, CAPILLARY
Glucose-Capillary: 105 mg/dL — ABNORMAL HIGH (ref 70–99)
Glucose-Capillary: 122 mg/dL — ABNORMAL HIGH (ref 70–99)
Glucose-Capillary: 125 mg/dL — ABNORMAL HIGH (ref 70–99)
Glucose-Capillary: 128 mg/dL — ABNORMAL HIGH (ref 70–99)
Glucose-Capillary: 89 mg/dL (ref 70–99)
Glucose-Capillary: 93 mg/dL (ref 70–99)

## 2023-07-11 LAB — COMPREHENSIVE METABOLIC PANEL
ALT: 20 U/L (ref 0–44)
AST: 37 U/L (ref 15–41)
Albumin: 2.8 g/dL — ABNORMAL LOW (ref 3.5–5.0)
Alkaline Phosphatase: 107 U/L (ref 38–126)
Anion gap: 8 (ref 5–15)
BUN: 25 mg/dL — ABNORMAL HIGH (ref 8–23)
CO2: 23 mmol/L (ref 22–32)
Calcium: 9.1 mg/dL (ref 8.9–10.3)
Chloride: 106 mmol/L (ref 98–111)
Creatinine, Ser: 1 mg/dL (ref 0.61–1.24)
GFR, Estimated: >60 mL/min (ref >60)
Glucose, Bld: 108 mg/dL — ABNORMAL HIGH (ref 70–99)
Potassium: 3.8 mmol/L (ref 3.5–5.1)
Sodium: 137 mmol/L (ref 135–145)
Total Bilirubin: 0.8 mg/dL (ref <1.2)
Total Protein: 6.2 g/dL — ABNORMAL LOW (ref 6.5–8.1)

## 2023-07-11 LAB — TSH: TSH: 3.767 u[IU]/mL (ref 0.350–4.500)

## 2023-07-11 LAB — MAGNESIUM: Magnesium: 1.4 mg/dL — ABNORMAL LOW (ref 1.7–2.4)

## 2023-07-11 LAB — PHOSPHORUS: Phosphorus: 3.4 mg/dL (ref 2.5–4.6)

## 2023-07-11 MED ORDER — CHLORDIAZEPOXIDE HCL 10 MG PO CAPS
10.0000 mg | ORAL_CAPSULE | Freq: Two times a day (BID) | ORAL | Status: DC
  Administered 2023-07-11: 10 mg via ORAL
  Filled 2023-07-11 (×2): qty 2

## 2023-07-11 MED ORDER — MAGNESIUM SULFATE 4 GM/100ML IV SOLN
4.0000 g | Freq: Once | INTRAVENOUS | Status: AC
  Administered 2023-07-11: 4 g via INTRAVENOUS
  Filled 2023-07-11: qty 100

## 2023-07-11 MED ORDER — SODIUM CHLORIDE 0.9 % IV SOLN
300.0000 mg | Freq: Once | INTRAVENOUS | Status: AC
  Administered 2023-07-11: 300 mg via INTRAVENOUS
  Filled 2023-07-11: qty 15

## 2023-07-11 MED ORDER — LORAZEPAM 1 MG PO TABS: 1.0000 mg | ORAL_TABLET | ORAL | Status: DC | PRN

## 2023-07-11 MED ORDER — CHLORDIAZEPOXIDE HCL 10 MG PO CAPS: 10.0000 mg | ORAL_CAPSULE | Freq: Two times a day (BID) | ORAL | Status: DC

## 2023-07-11 MED ORDER — IRON SUCROSE 300 MG IVPB - SIMPLE MED
300.0000 mg | Freq: Once | Status: DC
  Filled 2023-07-11: qty 265

## 2023-07-11 NOTE — Assessment & Plan Note (Signed)
Rate has been controlled, continue with metoprolol and anticoagulation with apixaban.  Ok to discontinue telemetry.

## 2023-07-11 NOTE — Progress Notes (Addendum)
Progress Note   Patient: Anthony A Parillo Jr. WUJ:[id number] DOB: 15-Nov-1955 DOA: 07/07/2023     3 DOS: the patient was seen and examined on 07/11/2023   Brief hospital course: Anthony [name] was admitted to the hospital with the working diagnosis of acute recurrent pulmonary embolism.   67 y.o. male with medical history significant of alcohol abuse, hypertension, homelessness, CVA, chronic HFmrEF, A-fib, history of PE in July 2024, hyperlipidemia, chronic hypotension, anxiety, depression, chronic low back pain, marijuana and tobacco abuse presented to the ED with complaints of shortness of breath and bilateral lower extremity edema.  Noted to be in A-fib with RVR with rate in the 140s when ambulating and 120s at rest.  Also noted to be intoxicated/ethanol on board per EMS.  He was found to have acute PE on imaging.   Noted to be volume overloaded and started on diuretic regimen.    He's relatively stable at this point.  Following repeat chest imaging as relative hypotension has limited diuresis.  No euvolemic.  Discharge will be complicated by homelessness.  He's been agitated/diffucult at times during the hospitalization.    12/22 continue benzodiazepines for alcohol withdrawal. Discontinue telemetry. Will need placement.   Assessment and Plan: * Acute pulmonary embolism (HCC) Recurrent pulmonary embolism.  CT chest with acute pulmonary embolism with moderate embolic burden. Right lower lobar pulmonary arteri intraluminal filling defect. RV/LV ration 1.2, stable from 02/2023. Dilatation of right heart chambers.   Echocardiogram with mildly reduced LV systolic function with EF 45 to 50, RV with mild reduced systolic function. RVSP 22.1 mmHg. LA with moderate dilatation and RA with mild to moderate dilatation.  Lower extremities [place] negative for deep vein thrombosis.   Patient has been transitioned to apixaban for anticoagulation.  Risks vs benefits have been considered and decision was made to  continue anticoagulation.  Ok to discontinue telemetry.   Alcohol use disorder, severe, dependence (HCC) Patient has been on CIWA lorazepam with good toleration. Will continue with chlordiazepoxide 10 mg bid.   Will add as needed lorazepam.  He has as needed haloperidol in case of agitation, has received dose today.  Currently on high dose of thiamine.   Patient has cognitive impairment, possible at his baseline.   Atrial fibrillation with rapid ventricular response (HCC) Rate has been controlled, continue with metoprolol and anticoagulation with apixaban.  Ok to discontinue telemetry.   Hyponatremia Hypomagnesemia.   Renal function with serum cr at 1,0 with K at 3,8 and serum bicarbonate at 23.  Na 137  P 3,4 Mg 1.4  Plan to add 4 g mag sulfate IV Holding on diuretic therapy. Continue to follow up electrolytes and renal function in am.    Acute on chronic diastolic CHF (congestive heart failure) (HCC) Echocardiogram with mild reduction in LV systolic function with EF 45 to 50%, no LVH, RV with moderate enlargement, mild reduced RV systolic function, RVSP 22.1 mmHg, LA with moderate dilatation with mild to moderate dilatation RA, mitral valve with mild to moderate regurgitation.   Patient clinically euvolemic.  Continue metoprolol.  Holdin RAAS inhibition due to risk of hypotension.   On admission had sings of acute pulmonary edema, including right pleural effusion, that now has resolved.  Follow up as outpatient left upper lobe subpleural ground glass infiltrate.   HTN (hypertension) Continue blood pressure control with metoprolol Hypotension has resolved.   History of CVA (cerebrovascular accident) Continue blood pressure control and statin   Iron deficiency anemia Iron panel with serum  iron 23, TIBC 413, transferrin saturation 6 and ferritin 18  Will add IV iron, hold on PO iron.  Hgb today is 9.7   Dyslipidemia Continue with rosuvastatin.          Subjective: Patient is calm and not agitated, he did required  haldol this morning. No chest pain and no dyspnea   Physical Exam: Vitals:   07/10/23 1948 07/10/23 2317 07/11/23 0500 07/11/23 0759  BP: 104/74 113/85 108/82 103/71  Pulse: 76 90 79 90  Resp: 17 18 18    Temp: 97.6 F (36.4 C) 98.2 F (36.8 C) 97.7 F (36.5 C) (!) 97.5 F (36.4 C)  TempSrc: Oral Oral Oral Oral  SpO2: 96% 96% 97% 100%  Weight:   68.4 kg   Height:       Neurology awake and alert, no agitated, mild confusion but not disorientation  ENT with mild pallor Cardiovascular with S1 and S2 present and regular with no gallops, rubs or murmurs Respiratory with no rales or wheezing, no rhonchi Abdomen with no distention  No lower extremity edema  Data Reviewed:    Family Communication: no family at the bedside   Disposition: Status is: Inpatient Remains inpatient appropriate because: pending placement   Planned Discharge Destination:  to be determined.      Author: Coralie Keens, MD 07/11/2023 10:33 AM  For on call review [email or web address].

## 2023-07-11 NOTE — Assessment & Plan Note (Signed)
Continue blood pressure control with metoprolol Hypotension has resolved.

## 2023-07-11 NOTE — Progress Notes (Signed)
Patient pulled IV again and getting out of bed. He is pleasantly confused. And doesn't know why he is pulling things off or out. Haldol 2 mg p.o. given.

## 2023-07-11 NOTE — Progress Notes (Signed)
Nurse found on unopened can of beer and a open bottle of vodka in pts belongings charge nurse aware and security  notified

## 2023-07-11 NOTE — Assessment & Plan Note (Signed)
Continue with rosuvastatin.  

## 2023-07-11 NOTE — Assessment & Plan Note (Addendum)
Patient has been on CIWA lorazepam with good toleration. Will continue with chlordiazepoxide 10 mg bid.   Will add as needed lorazepam.  He has as needed haloperidol in case of agitation, has received dose today.  Currently on high dose of thiamine.   Patient has cognitive impairment, possible at his baseline.

## 2023-07-11 NOTE — Assessment & Plan Note (Addendum)
Echocardiogram with mild reduction in LV systolic function with EF 45 to 50%, no LVH, RV with moderate enlargement, mild reduced RV systolic function, RVSP 22.1 mmHg, LA with moderate dilatation with mild to moderate dilatation RA, mitral valve with mild to moderate regurgitation.   Patient clinically euvolemic.  Continue metoprolol.  Holdin RAAS inhibition due to risk of hypotension.   On admission had sings of acute pulmonary edema, including right pleural effusion, that now has resolved.  Follow up as outpatient left upper lobe subpleural ground glass infiltrate.

## 2023-07-11 NOTE — Progress Notes (Signed)
Patient got out of bed and pulled bp cuff off from wall and pulled pure wick off and was with no clothes on. And pulled the rapped off from the IV that was placed to protect it. Bed alarm was on and did not go off. Took patient to bathroom and clean and assist back to bed.

## 2023-07-11 NOTE — Progress Notes (Signed)
Unable to do MRI due to back pain. Dr. Margo Aye text paged.

## 2023-07-11 NOTE — Assessment & Plan Note (Signed)
Continue blood pressure control and statin

## 2023-07-11 NOTE — Assessment & Plan Note (Signed)
Hypomagnesemia.   Renal function with serum cr at 1,0 with K at 3,8 and serum bicarbonate at 23.  Na 137  P 3,4 Mg 1.4  Plan to add 4 g mag sulfate IV Holding on diuretic therapy. Continue to follow up electrolytes and renal function in am.

## 2023-07-11 NOTE — Plan of Care (Signed)
  Problem: Education: Goal: Ability to demonstrate management of disease process will improve Outcome: Progressing Goal: Ability to verbalize understanding of medication therapies will improve Outcome: Progressing Goal: Individualized Educational Video(s) Outcome: Progressing   Problem: Activity: Goal: Capacity to carry out activities will improve Outcome: Progressing   Problem: Cardiac: Goal: Ability to achieve and maintain adequate cardiopulmonary perfusion will improve Outcome: Progressing   Problem: Education: Goal: Knowledge of General Education information will improve Description: Including pain rating scale, medication(s)/side effects and non-pharmacologic comfort measures Outcome: Progressing   Problem: Health Behavior/Discharge Planning: Goal: Ability to manage health-related needs will improve Outcome: Progressing   Problem: Clinical Measurements: Goal: Ability to maintain clinical measurements within normal limits will improve Outcome: Progressing Goal: Will remain free from infection Outcome: Progressing Goal: Diagnostic test results will improve Outcome: Progressing Goal: Respiratory complications will improve Outcome: Progressing Goal: Cardiovascular complication will be avoided Outcome: Progressing   Problem: Activity: Goal: Risk for activity intolerance will decrease Outcome: Progressing   Problem: Nutrition: Goal: Adequate nutrition will be maintained Outcome: Progressing   Problem: Coping: Goal: Level of anxiety will decrease Outcome: Progressing   Problem: Elimination: Goal: Will not experience complications related to bowel motility Outcome: Progressing Goal: Will not experience complications related to urinary retention Outcome: Progressing   Problem: Pain Management: Goal: General experience of comfort will improve Outcome: Progressing   Problem: Safety: Goal: Ability to remain free from injury will improve Outcome: Progressing    Problem: Skin Integrity: Goal: Risk for impaired skin integrity will decrease Outcome: Progressing

## 2023-07-11 NOTE — Assessment & Plan Note (Signed)
Iron panel with serum iron 23, TIBC 413, transferrin saturation 6 and ferritin 18  Will add IV iron, hold on PO iron.  Hgb today is 9.7

## 2023-07-11 NOTE — Hospital Course (Addendum)
Anthony [name] was admitted to the hospital with the working diagnosis of acute recurrent pulmonary embolism.   67 y.o. male with medical history significant of alcohol abuse, hypertension, homelessness, CVA, chronic HFmrEF, A-fib, history of PE in July 2024, hyperlipidemia, chronic hypotension, anxiety, depression, chronic low back pain, marijuana and tobacco abuse presented to the ED with complaints of shortness of breath and bilateral lower extremity edema.  Noted to be in A-fib with RVR with rate in the 140s when ambulating and 120s at rest.  Also noted to be intoxicated/ethanol on board per EMS.  He was found to have acute PE on imaging.   Noted to be volume overloaded and started on diuretic regimen.    He's relatively stable at this point.  Following repeat chest imaging as relative hypotension has limited diuresis.  No euvolemic.  Discharge will be complicated by homelessness.  He's been agitated/diffucult at times during the hospitalization.    12/22 continue benzodiazepines for alcohol withdrawal. Discontinue telemetry. Will need placement.

## 2023-07-11 NOTE — Assessment & Plan Note (Addendum)
Recurrent pulmonary embolism.  CT chest with acute pulmonary embolism with moderate embolic burden. Right lower lobar pulmonary arteri intraluminal filling defect. RV/LV ration 1.2, stable from 02/2023. Dilatation of right heart chambers.   Echocardiogram with mildly reduced LV systolic function with EF 45 to 50, RV with mild reduced systolic function. RVSP 22.1 mmHg. LA with moderate dilatation and RA with mild to moderate dilatation.  Lower extremities [place] negative for deep vein thrombosis.   Patient has been transitioned to apixaban for anticoagulation.  Risks vs benefits have been considered and decision was made to continue anticoagulation.  Ok to discontinue telemetry.

## 2023-07-12 ENCOUNTER — Other Ambulatory Visit: Payer: Self-pay

## 2023-07-12 ENCOUNTER — Telehealth: Payer: Self-pay | Admitting: Family Medicine

## 2023-07-12 ENCOUNTER — Emergency Department (HOSPITAL_COMMUNITY)
Admission: EM | Admit: 2023-07-12 | Discharge: 2023-07-13 | Disposition: A | Discharge status: Home / Self Care | Payer: 59 | Attending: Emergency Medicine | Admitting: Emergency Medicine

## 2023-07-12 ENCOUNTER — Emergency Department (HOSPITAL_COMMUNITY): Payer: 59

## 2023-07-12 ENCOUNTER — Other Ambulatory Visit (HOSPITAL_COMMUNITY): Payer: Self-pay

## 2023-07-12 ENCOUNTER — Encounter (HOSPITAL_COMMUNITY): Payer: Self-pay | Admitting: Emergency Medicine

## 2023-07-12 DIAGNOSIS — I4891 Unspecified atrial fibrillation: Secondary | ICD-10-CM | POA: Insufficient documentation

## 2023-07-12 DIAGNOSIS — J984 Other disorders of lung: Secondary | ICD-10-CM | POA: Diagnosis not present

## 2023-07-12 DIAGNOSIS — Z8673 Personal history of transient ischemic attack (TIA), and cerebral infarction without residual deficits: Secondary | ICD-10-CM | POA: Insufficient documentation

## 2023-07-12 DIAGNOSIS — W19XXXA Unspecified fall, initial encounter: Secondary | ICD-10-CM | POA: Insufficient documentation

## 2023-07-12 DIAGNOSIS — Z9181 History of falling: Secondary | ICD-10-CM | POA: Diagnosis not present

## 2023-07-12 DIAGNOSIS — R2689 Other abnormalities of gait and mobility: Secondary | ICD-10-CM | POA: Insufficient documentation

## 2023-07-12 DIAGNOSIS — I5033 Acute on chronic diastolic (congestive) heart failure: Secondary | ICD-10-CM

## 2023-07-12 DIAGNOSIS — Z79899 Other long term (current) drug therapy: Secondary | ICD-10-CM | POA: Insufficient documentation

## 2023-07-12 DIAGNOSIS — Z5900 Homelessness unspecified: Secondary | ICD-10-CM | POA: Diagnosis not present

## 2023-07-12 DIAGNOSIS — R0600 Dyspnea, unspecified: Secondary | ICD-10-CM | POA: Insufficient documentation

## 2023-07-12 DIAGNOSIS — E785 Hyperlipidemia, unspecified: Secondary | ICD-10-CM | POA: Insufficient documentation

## 2023-07-12 DIAGNOSIS — Z85828 Personal history of other malignant neoplasm of skin: Secondary | ICD-10-CM | POA: Diagnosis not present

## 2023-07-12 DIAGNOSIS — R4182 Altered mental status, unspecified: Secondary | ICD-10-CM | POA: Insufficient documentation

## 2023-07-12 DIAGNOSIS — I110 Hypertensive heart disease with heart failure: Secondary | ICD-10-CM | POA: Diagnosis not present

## 2023-07-12 DIAGNOSIS — R41841 Cognitive communication deficit: Secondary | ICD-10-CM | POA: Diagnosis not present

## 2023-07-12 DIAGNOSIS — I2699 Other pulmonary embolism without acute cor pulmonale: Secondary | ICD-10-CM | POA: Diagnosis not present

## 2023-07-12 DIAGNOSIS — Z86711 Personal history of pulmonary embolism: Secondary | ICD-10-CM | POA: Insufficient documentation

## 2023-07-12 DIAGNOSIS — J90 Pleural effusion, not elsewhere classified: Secondary | ICD-10-CM | POA: Diagnosis not present

## 2023-07-12 DIAGNOSIS — I509 Heart failure, unspecified: Secondary | ICD-10-CM | POA: Insufficient documentation

## 2023-07-12 DIAGNOSIS — F1020 Alcohol dependence, uncomplicated: Secondary | ICD-10-CM | POA: Diagnosis not present

## 2023-07-12 DIAGNOSIS — R600 Localized edema: Secondary | ICD-10-CM | POA: Diagnosis not present

## 2023-07-12 DIAGNOSIS — Z7901 Long term (current) use of anticoagulants: Secondary | ICD-10-CM | POA: Diagnosis not present

## 2023-07-12 DIAGNOSIS — I5042 Chronic combined systolic (congestive) and diastolic (congestive) heart failure: Secondary | ICD-10-CM | POA: Insufficient documentation

## 2023-07-12 DIAGNOSIS — E871 Hypo-osmolality and hyponatremia: Secondary | ICD-10-CM

## 2023-07-12 LAB — CBC WITH DIFFERENTIAL/PLATELET
Abs Immature Granulocytes: 0.01 10*3/uL (ref 0.00–0.07)
Basophils Absolute: 0.1 10*3/uL (ref 0.0–0.1)
Basophils Relative: 1 %
Eosinophils Absolute: 0 10*3/uL (ref 0.0–0.5)
Eosinophils Relative: 1 %
HCT: 32.3 % — ABNORMAL LOW (ref 39.0–52.0)
Hemoglobin: 10 g/dL — ABNORMAL LOW (ref 13.0–17.0)
Immature Granulocytes: 0 %
Lymphocytes Relative: 43 %
Lymphs Abs: 1.9 10*3/uL (ref 0.7–4.0)
MCH: 31.6 pg (ref 26.0–34.0)
MCHC: 31 g/dL (ref 30.0–36.0)
MCV: 102.2 fL — ABNORMAL HIGH (ref 80.0–100.0)
Monocytes Absolute: 0.5 10*3/uL (ref 0.1–1.0)
Monocytes Relative: 12 %
Neutro Abs: 1.9 10*3/uL (ref 1.7–7.7)
Neutrophils Relative %: 43 %
Platelets: 243 10*3/uL (ref 150–400)
RBC: 3.16 MIL/uL — ABNORMAL LOW (ref 4.22–5.81)
RDW: 21.8 % — ABNORMAL HIGH (ref 11.5–15.5)
WBC: 4.4 10*3/uL (ref 4.0–10.5)
nRBC: 0 % (ref 0.0–0.2)

## 2023-07-12 LAB — COMPREHENSIVE METABOLIC PANEL
ALT: 36 U/L (ref 0–44)
AST: 66 U/L — ABNORMAL HIGH (ref 15–41)
Albumin: 3 g/dL — ABNORMAL LOW (ref 3.5–5.0)
Alkaline Phosphatase: 106 U/L (ref 38–126)
Anion gap: 6 (ref 5–15)
BUN: 21 mg/dL (ref 8–23)
CO2: 23 mmol/L (ref 22–32)
Calcium: 8.8 mg/dL — ABNORMAL LOW (ref 8.9–10.3)
Chloride: 107 mmol/L (ref 98–111)
Creatinine, Ser: 0.9 mg/dL (ref 0.61–1.24)
GFR, Estimated: >60 mL/min (ref >60)
Glucose, Bld: 116 mg/dL — ABNORMAL HIGH (ref 70–99)
Potassium: 3.9 mmol/L (ref 3.5–5.1)
Sodium: 136 mmol/L (ref 135–145)
Total Bilirubin: 0.6 mg/dL (ref <1.2)
Total Protein: 6.6 g/dL (ref 6.5–8.1)

## 2023-07-12 LAB — GLUCOSE, CAPILLARY
Glucose-Capillary: 126 mg/dL — ABNORMAL HIGH (ref 70–99)
Glucose-Capillary: 65 mg/dL — ABNORMAL LOW (ref 70–99)
Glucose-Capillary: 75 mg/dL (ref 70–99)
Glucose-Capillary: 93 mg/dL (ref 70–99)

## 2023-07-12 LAB — URINALYSIS, ROUTINE W REFLEX MICROSCOPIC
Bilirubin Urine: NEGATIVE
Glucose, UA: NEGATIVE mg/dL
Hgb urine dipstick: NEGATIVE
Ketones, ur: NEGATIVE mg/dL
Leukocytes,Ua: NEGATIVE
Nitrite: NEGATIVE
Protein, ur: NEGATIVE mg/dL
Specific Gravity, Urine: 1.015 (ref 1.005–1.030)
pH: 6 (ref 5.0–8.0)

## 2023-07-12 LAB — CULTURE, BLOOD (ROUTINE X 2)
Culture: NO GROWTH
Culture: NO GROWTH
Special Requests: ADEQUATE

## 2023-07-12 LAB — BASIC METABOLIC PANEL
Anion gap: 8 (ref 5–15)
BUN: 25 mg/dL — ABNORMAL HIGH (ref 8–23)
CO2: 19 mmol/L — ABNORMAL LOW (ref 22–32)
Calcium: 8.3 mg/dL — ABNORMAL LOW (ref 8.9–10.3)
Chloride: 108 mmol/L (ref 98–111)
Creatinine, Ser: 0.96 mg/dL (ref 0.61–1.24)
GFR, Estimated: >60 mL/min (ref >60)
Glucose, Bld: 45 mg/dL — ABNORMAL LOW (ref 70–99)
Potassium: 4 mmol/L (ref 3.5–5.1)
Sodium: 135 mmol/L (ref 135–145)

## 2023-07-12 LAB — MAGNESIUM: Magnesium: 2.1 mg/dL (ref 1.7–2.4)

## 2023-07-12 LAB — BRAIN NATRIURETIC PEPTIDE: B Natriuretic Peptide: 754.3 pg/mL — ABNORMAL HIGH (ref 0.0–100.0)

## 2023-07-12 MED ORDER — THIAMINE HCL 100 MG PO TABS
100.0000 mg | ORAL_TABLET | Freq: Every day | ORAL | 0 refills | Status: DC
  Filled 2023-07-12: qty 30, 30d supply, fill #0

## 2023-07-12 MED ORDER — ROSUVASTATIN CALCIUM 10 MG PO TABS
10.0000 mg | ORAL_TABLET | Freq: Every day | ORAL | 0 refills | Status: DC
  Filled 2023-07-12: qty 30, 30d supply, fill #0

## 2023-07-12 MED ORDER — METOPROLOL TARTRATE 25 MG PO TABS
25.0000 mg | ORAL_TABLET | Freq: Two times a day (BID) | ORAL | 0 refills | Status: DC
  Filled 2023-07-12: qty 60, 30d supply, fill #0

## 2023-07-12 MED ORDER — APIXABAN 5 MG PO TABS
ORAL_TABLET | ORAL | 0 refills | Status: DC
  Filled 2023-07-12: qty 64, 29d supply, fill #0

## 2023-07-12 MED ORDER — FERROUS SULFATE 325 (65 FE) MG PO TABS
325.0000 mg | ORAL_TABLET | Freq: Every day | ORAL | 0 refills | Status: DC
  Filled 2023-07-12: qty 30, 30d supply, fill #0

## 2023-07-12 MED ORDER — FOLIC ACID 1 MG PO TABS
1.0000 mg | ORAL_TABLET | Freq: Every day | ORAL | 0 refills | Status: DC
  Filled 2023-07-12: qty 30, 30d supply, fill #0

## 2023-07-12 NOTE — ED Notes (Signed)
Patient endorses bilateral leg weakness and foot weakness to this RN. Patient states he has had recent falls due to "his legs feeling like giving out."

## 2023-07-12 NOTE — Progress Notes (Signed)
Repeat CBG 126.

## 2023-07-12 NOTE — TOC Transition Note (Signed)
Transition of Care [facility]) - Discharge Note Donn Pierini RN, BSN Transitions of Care Unit 4E- RN Case Manager See Treatment Team for direct phone #   Patient Details  Name: Anthony A Leffler Jr. MRN: [id number] Date of Birth: [birth year]  Transition [facility] Care [facility] CM/SW Contact:  Darrold Span, RN Phone Number: 07/12/2023, 9:59 AM   Clinical Narrative:    Pt stable for transition home today. Note pt does not have PCP- f/u appointment made w/ [facility] for 1/16- info placed on AVS.   No further TOC needs noted.    Final next level of care: Home/Self Care Barriers to Discharge: No Barriers Identified   Patient Goals and CMS Choice      No HH or DME needs noted for choice       Discharge Placement               Home/motel or shelter        Discharge Plan and Services Additional resources added to the After Visit Summary for     Discharge Planning Services: CM Consult, Follow-up appt scheduled Post Acute Care Choice: NA          DME Arranged: N/A DME Agency: NA       HH Arranged: NA HH Agency: NA        Social Drivers of Health (SDOH) Interventions SDOH Screenings   Food Insecurity: Food Insecurity Present (07/08/2023)  Housing: High Risk (07/08/2023)  Transportation Needs: Unmet Transportation Needs (07/08/2023)  Utilities: At Risk (07/08/2023)  Depression (PHQ2-9): Medium Risk (10/16/2020)  Tobacco Use: High Risk (07/08/2023)     Readmission Risk Interventions    07/12/2023    9:58 AM 02/08/2023    5:25 PM 10/12/2022    1:29 PM  Readmission Risk Prevention Plan  Transportation Screening Complete Complete Complete  Medication Review Oceanographer) Complete Complete Complete  PCP or Specialist appointment within 3-5 days of discharge -- Complete Complete  HRI or Home Care Consult Complete Complete Complete  SW Recovery Care/Counseling Consult Complete Complete Complete  Palliative Care Screening Not Applicable Not Applicable Not Applicable   Skilled Nursing Facility Not Applicable Complete Not Applicable

## 2023-07-12 NOTE — Progress Notes (Signed)
Anthony A Orlowski Jr. to be D/C'd Home per MD order.  Discussed with the patient and all questions fully answered.  VSS, Skin clean, dry and intact without evidence of skin break down, no evidence of skin tears noted. IV catheter discontinued intact. Site without signs and symptoms of complications. Dressing and pressure applied.  An After Visit Summary was printed and given to the patient. Patient received prescription.  D/c education completed with patient/family including follow up instructions, medication list, d/c activities limitations if indicated, with other d/c instructions as indicated by MD - patient able to verbalize understanding, all questions fully answered.   Patient instructed to return to ED, call 911, or call MD for any changes in condition.   Patient escorted via WC, and D/C home via private auto.  Selena Batten Mervil Wacker 07/12/2023 10:44 AM

## 2023-07-12 NOTE — Progress Notes (Signed)
Patient sleeping and awaken to do CBG it was 65. Patient remains confused. Skin warm and dry. Gave choc. Pudding and 1/2 cup of Coke. To drink. Lupita Leash RN aware and into see patient. Dr. Delma Post on floor and aware. Will reck. CBG shortly after eating.

## 2023-07-12 NOTE — Progress Notes (Addendum)
Pt refusing to let nurse and nurse tech take temperature this morning

## 2023-07-12 NOTE — ED Provider Triage Note (Cosign Needed Addendum)
Emergency Medicine Provider Triage Evaluation Note  Anthony A Bazzle Jr. , a 67 y.o. male  was evaluated in triage.  Pt complains of difficulty with recalling where he was, difficulty with ambulation, "multiple falls". He was found walking around outside of ER by EMS.  He reports last fall was earlier today he had a fall on median. He denies loc, head injury. Complains of pain "under arm pits"  On Eliquis  Review of Systems  Positive: Pedal edema, confusion Negative: Fever, CP, SOB  Physical Exam  BP 105/67 (BP Location: Right Arm)   Pulse 94   Temp 97.8 F (36.6 C) (Oral)   Resp 16   SpO2 98%  Gen:   Awake, no distress. A&Ox3  Resp:  Normal effort  MSK:   Moves extremities without difficulty  Other:  TTP to cervical spinous processes and "under arm pits"  Medical Decision Making  Medically screening exam initiated at 3:32 PM.  Appropriate orders placed.  Anthony A Hammersmith Jr. was informed that the remainder of the evaluation will be completed by another provider, this initial triage assessment does not replace that evaluation, and the importance of remaining in the ED until their evaluation is complete.  Labs and imaging ordered      Judithann Sheen, [place] 07/12/23 2841

## 2023-07-12 NOTE — Progress Notes (Signed)
CSW spoke with ED provider who is requesting SNF placement. Patient is homeless and discharged from [facility] medical [facility] at [facility] today. Patient was previously at [facility] for nursing and rehabilitation in July of 2024. Patient did not meet criteria for SNF placement from his recent admission. PT consult is pending.

## 2023-07-12 NOTE — Progress Notes (Signed)
Mobility Specialist Progress Note:   07/12/23 0925  Mobility  Activity Stood at bedside  Level of Assistance Standby assist, set-up cues, supervision of patient - no hands on  Mobility Referral Yes  Mobility visit 1 Mobility  Mobility Specialist Start Time (ACUTE ONLY) 0915  Mobility Specialist Stop Time (ACUTE ONLY) 0925  Mobility Specialist Time Calculation (min) (ACUTE ONLY) 10 min   Pt found standing at bedside with bed alarm going off. Pt attempting to put on pants, refusing to sit back down. RN notified. Pt left EOB with RN still present in room.   Anthony [name]  Mobility Specialist Please contact via [employer] office at [phone]

## 2023-07-12 NOTE — ED Triage Notes (Signed)
Pt was found walking around outside of ER by EMS trying to find entrance. Pt was assisted into ER to get registered. Pt states he was just discharged today from hospital. Pt can not recall why he is in the ER or what is going on. Pt states he was seen for concussions and bilateral leg swelling and difficulty walking yesterday.

## 2023-07-12 NOTE — ED Provider Notes (Incomplete)
[place] EMERGENCY DEPARTMENT AT [facility] Provider Note   CSN: [id number] Arrival date & time: 07/12/23  1431     History {Add pertinent medical, surgical, social history, OB history to HPI:1} Chief Complaint  Patient presents with   Altered Mental Status    Jeramey A Parrella Montez Hageman. is a 67 y.o. male.  HPI      67 year old male with a history of alcohol abuse, hypertension, homelessness, CVA, chronic heart failure, atrial fibrillation, history of PE in July 2024, hyperlipidemia, chronic hypotension, anxiety, depression, chronic low back pain, marijuana and tobacco use, recent admission for A-fib with RVR alcohol intoxication and acute PE on December 18, who was found walking outside of the ER by EMS trying to find the entrance and reported he is not sure why he is here.  He was discharged from the hospitalist service earlier today.  Can't breathe right, can't think straight, have blood clots.  Thinks should have gone to ALF.   Past Medical History:  Diagnosis Date   Actinic keratosis 11/27/2016   Alcohol abuse with alcohol-induced mood disorder (HCC) 07/18/2018   Alcohol use disorder, severe, dependence (HCC) 09/16/2006   05/22/2015 Argumentative, belligerent and verbally abusive to staff per his note from [place]    Alcoholism [facility])    Anxiety state 04/25/2018   Aortic atherosclerosis (HCC) 08/27/2022   Chronic low back pain 09/16/2006   Followed by NS.  Per his chart from [place], he fell off two storyhouse roof while cleaning his brother's gutters and sustained compression fracture of L3, L4 and L5 and fracture of right transverse process at L3 and nondisplaced fracture of left glenoid rim many years ago. He had multiple imaging in [place] including Lumbar MRI in 2016 which showed moderate to severe spinal canal stenos   Delirium tremens (HCC) 09/01/2017   Depression    Dry eye 11/23/2016   Foreign body in middle portion of esophagus 05/19/2022    Hiatal hernia 09/14/2016   Status Collis gastroplasty and Nissen's fundoplication on 04/29/2015 in [place]   History of delirium tremons 07/22/2020   DTs during 10/2016 08/2017. And 12/2017 admissions    History of pulmonary embolus (PE) 11/02/2016   Unprovoked 11/01/2016. Xarelto from 11/01/16 to 09/08/2017.   Hydrocele    Surgically corrected   Hypertension    Hypoalbuminemia due to protein-calorie malnutrition (HCC) 07/22/2020   Lumbar compression fracture (HCC)    Multiple rib fractures 07/22/2019   Left rib details XR: left ribs demonstrate multiple remote rib   Pancytopenia (HCC)    Rhabdomyolysis 07/22/2020   Skin cancer    exciced 2017   Tinea versicolor 06/08/2013   Tobacco use disorder 07/22/2020   Tooth infection 04/25/2018   Trigger finger, acquired 11/18/2012   Ulnar tunnel syndrome of right wrist 11/08/2012     Home Medications Prior to Admission medications   Medication Sig Start Date End Date Taking? Authorizing Provider  acetaminophen (TYLENOL) 500 MG tablet Take 1 tablet (500 mg total) by mouth every 8 (eight) hours as needed. Patient taking differently: Take 1,000 mg by mouth every 6 (six) hours as needed (for pain). 10/13/22   Leatha Gilding, MD  apixaban (ELIQUIS) 5 MG TABS tablet Take 2 tablets daily (10 mg total) twice daily for 4 days then on Dec 27, reduce to 1 tablet (5 mg total) twice daily. 07/12/23   Danford, Earl Lites, MD  ferrous sulfate 325 (65 FE) MG tablet Take 1 tablet (325 mg total) by mouth daily with breakfast.  07/12/23 08/11/23  DanfordEarl Lites, MD  folic acid (FOLVITE) 1 MG tablet Take 1 tablet (1 mg total) by mouth daily. 07/12/23   Danford, Earl Lites, MD  magnesium oxide (MAG-OX) 400 (240 Mg) MG tablet Take 1 tablet (400 mg total) by mouth 2 (two) times daily. Patient not taking: Reported on 07/08/2023 02/16/23   Leroy Sea, MD  metoprolol tartrate (LOPRESSOR) 25 MG tablet Take 1 tablet (25 mg total) by mouth 2 (two)  times daily. 07/12/23 10/10/23  DanfordEarl Lites, MD  mometasone-formoterol (DULERA) 200-5 MCG/ACT AERO Inhale 2 puffs into the lungs 2 (two) times daily. Patient not taking: Reported on 07/08/2023 02/16/23   Leroy Sea, MD  pantoprazole (PROTONIX) 40 MG tablet Take 1 tablet (40 mg total) by mouth daily. Patient not taking: Reported on 07/08/2023 02/17/23   Leroy Sea, MD  rosuvastatin (CRESTOR) 10 MG tablet Take 1 tablet (10 mg total) by mouth daily. 07/12/23 10/10/23  DanfordEarl Lites, MD  thiamine (VITAMIN B1) 100 MG tablet Take 1 tablet (100 mg total) by mouth daily. 07/12/23   Danford, Earl Lites, MD      Allergies    Other and Poison ivy extract    Review of Systems   Review of Systems  Physical Exam Updated Vital Signs BP 108/80   Pulse 91   Temp 97.8 F (36.6 C) (Oral)   Resp 18   SpO2 100%  Physical Exam  ED Results / Procedures / Treatments   Labs (all labs ordered are listed, but only abnormal results are displayed) Labs Reviewed  CBC WITH DIFFERENTIAL/PLATELET - Abnormal; Notable for the following components:      Result Value   RBC 3.16 (*)    Hemoglobin 10.0 (*)    HCT 32.3 (*)    MCV 102.2 (*)    RDW 21.8 (*)    All other components within normal limits  COMPREHENSIVE METABOLIC PANEL - Abnormal; Notable for the following components:   Glucose, Bld 116 (*)    Calcium 8.8 (*)    Albumin 3.0 (*)    AST 66 (*)    All other components within normal limits  URINALYSIS, ROUTINE W REFLEX MICROSCOPIC    EKG None  Radiology CT Head Wo Contrast Result Date: 07/12/2023 CLINICAL DATA:  Head trauma, minor (Age >= 65y); Neck trauma (Age >= 65y) EXAM: CT HEAD WITHOUT CONTRAST CT CERVICAL SPINE WITHOUT CONTRAST TECHNIQUE: Multidetector CT imaging of the head and cervical spine was performed following the standard protocol without intravenous contrast. Multiplanar CT image reconstructions of the cervical spine were also generated. RADIATION  DOSE REDUCTION: This exam was performed according to the departmental dose-optimization program which includes automated exposure control, adjustment of the mA and/or kV according to patient size and/or use of iterative reconstruction technique. COMPARISON:  CT Head 07/08/23 FINDINGS: CT HEAD FINDINGS Brain: No hemorrhage. No hydrocephalus. No extra-axial fluid collection. No CT evidence of an acute cortical infarct. No mass effect. No mass lesion. Vascular: No hyperdense vessel or unexpected calcification. Skull: Normal. Negative for fracture or focal lesion. Sinuses/Orbits: No middle ear or mastoid effusion. Paranasal sinuses are clear. Bilateral lens replacement. Orbits are otherwise unremarkable. Other: None. CT CERVICAL SPINE FINDINGS Alignment: Grade 1 anterolisthesis of C3 on C4. Trace retrolisthesis of C4 on C5. Skull base and vertebrae: No acute fracture. No primary bone lesion or focal pathologic process. Soft tissues and spinal canal: No prevertebral fluid or swelling. No visible canal hematoma. Disc levels:  No CT  evidence of high-grade spinal canal stenosis Upper chest: Right-sided pleural effusion, partially imaged. Other: None IMPRESSION: 1. No CT evidence of intracranial injury. 2. No acute fracture or traumatic subluxation of the cervical spine. Electronically Signed   By: Lorenza Cambridge M.D.   On: 07/12/2023 18:17   CT Cervical Spine Wo Contrast Result Date: 07/12/2023 CLINICAL DATA:  Head trauma, minor (Age >= 65y); Neck trauma (Age >= 65y) EXAM: CT HEAD WITHOUT CONTRAST CT CERVICAL SPINE WITHOUT CONTRAST TECHNIQUE: Multidetector CT imaging of the head and cervical spine was performed following the standard protocol without intravenous contrast. Multiplanar CT image reconstructions of the cervical spine were also generated. RADIATION DOSE REDUCTION: This exam was performed according to the departmental dose-optimization program which includes automated exposure control, adjustment of the mA  and/or kV according to patient size and/or use of iterative reconstruction technique. COMPARISON:  CT Head 07/08/23 FINDINGS: CT HEAD FINDINGS Brain: No hemorrhage. No hydrocephalus. No extra-axial fluid collection. No CT evidence of an acute cortical infarct. No mass effect. No mass lesion. Vascular: No hyperdense vessel or unexpected calcification. Skull: Normal. Negative for fracture or focal lesion. Sinuses/Orbits: No middle ear or mastoid effusion. Paranasal sinuses are clear. Bilateral lens replacement. Orbits are otherwise unremarkable. Other: None. CT CERVICAL SPINE FINDINGS Alignment: Grade 1 anterolisthesis of C3 on C4. Trace retrolisthesis of C4 on C5. Skull base and vertebrae: No acute fracture. No primary bone lesion or focal pathologic process. Soft tissues and spinal canal: No prevertebral fluid or swelling. No visible canal hematoma. Disc levels:  No CT evidence of high-grade spinal canal stenosis Upper chest: Right-sided pleural effusion, partially imaged. Other: None IMPRESSION: 1. No CT evidence of intracranial injury. 2. No acute fracture or traumatic subluxation of the cervical spine. Electronically Signed   By: Lorenza Cambridge M.D.   On: 07/12/2023 18:17   DG Chest 2 View Result Date: 07/12/2023 CLINICAL DATA:  Altered mental status EXAM: CHEST - 2 VIEW COMPARISON:  07/10/2023 FINDINGS: Midline trachea. Mild cardiomegaly. Small bilateral pleural effusions. Mild interstitial edema. Persistent bibasilar airspace disease. Hyperinflation. Remote distal right clavicle fracture. Old bilateral rib fractures. IMPRESSION: Mild congestive heart failure with small bilateral pleural effusions. Bibasilar Airspace disease, likely atelectasis. Electronically Signed   By: Jeronimo Greaves M.D.   On: 07/12/2023 17:20    Procedures Procedures  {Document cardiac monitor, telemetry assessment procedure when appropriate:1}  Medications Ordered in ED Medications - No data to display  ED Course/ Medical  Decision Making/ A&P   {   Click here for ABCD2, HEART and other calculatorsREFRESH Note before signing :1}                              Medical Decision Making Amount and/or Complexity of Data Reviewed Labs: ordered.    Labs completed and personally management by me show hemoglobin of 10, stable from prior, no leukocytosis, no clinically significant electrolyte abnormalities  CT head and cervical spine completed given history of fall on anticoagulation shows no evidence of intracranial injury or cervical spine injury  Chest x-ray shows mild congestive heart failure with small bilateral pleural effusions. {Document critical care time when appropriate:1} {Document review of labs and clinical decision tools ie heart score, Chads2Vasc2 etc:1}  {Document your independent review of radiology images, and any outside records:1} {Document your discussion with family members, caretakers, and with consultants:1} {Document social determinants of health affecting pt's care:1} {Document your decision making why or why not admission,  treatments were needed:1} Final Clinical Impression(s) / ED Diagnoses Final diagnoses:  None    Rx / DC Orders ED Discharge Orders     None

## 2023-07-12 NOTE — Discharge Summary (Addendum)
Physician Discharge Summary   Patient: Anthony A Kellis Jr. MRN: [id number] DOB: 06-22-1956  Admit date:     07/07/2023  Discharge date: 07/12/23  Discharge Physician: Alberteen Sam   PCP: Pcp, No     Recommendations at discharge:  Follow up with PCP scheduled in 3 weeks for CHF, chronic PE and Afib      Discharge Diagnoses: Principal Problem:   Acute pulmonary embolism (HCC) Active Problems:   Alcohol use disorder, severe, dependence (HCC)   Atrial fibrillation with rapid ventricular response (HCC)   Acute on chronic diastolic CHF (congestive heart failure) (HCC)   Hyponatremia   HTN (hypertension)   History of CVA (cerebrovascular accident)   Iron deficiency anemia   Dyslipidemia       Hospital Course: 67 y.o. M with alcoholism, HTN, HFmrEF, Afib noncompliant with AC, hx recurrent VTE noncompliant with AC, chronic low back pain and homelessness who presented with shortness of breath and leg swelling.  In the ER found to be in A-fib heart rates in the 140s.  CTA showed acute PE.  Started on anticoagulation and admitted.        * Acute pulmonary embolism (HCC) This is a recurrent event, due to noncompliance with his medications.  Patient was treated here with heparin, transition to Eliquis.  He had bilateral lower extremity ultrasounds that were negative for DVT, and echocardiogram showed chronic biventricular heart failure.  He is stable on room air, and asymptomatic with ambulation.  He reported intention not to take any medications, as he does not trust anyone in the hospital.  He was provided prescriptions from [facility] pharmacy which were delivered to him, and a follow-up with a new PCP was scheduled.      Alcohol use disorder, severe, dependence (HCC) Patient was treated with Librium taper here, but CIWA scores were persistently low and he did not appear to be in active withdrawal.  Recommend alcohol cessation.    There was some consideration by the  previous provider that he has cognitive impairment (this was also noted by psychiatry on their evaluation last May) but his guarded demeanor makes assessment of his cognitive abilities unreliable.  He was evaluated by PT and OT who felt he required no acute rehab.  To me he is oriented to person, place and time.  He is unable to name any trusted social supports.  He is guarded in other answers, expresses extreme distrust, which is consistent with his apparent pattern of chronic homelessness and disconnect from the medical establishment. He is not actively psychotic and I do not think IVC is appropriate.  He is high risk for readmission given secondary gain of hospitalization during cold temperatures outside but he is medically stable for discharge and has no acute medical needs.   Atrial fibrillation with rapid ventricular response (HCC) Rate easily controlled in the hospital on oral metoprolol.  Appears to be noncompliant.  Provided with refills at discharge.  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  Acute HF treated and resolved. Provided with refills at discharge.  PCP appointment scheduled.  History of CVA (cerebrovascular accident)  Iron deficiency anemia Given IV iron.  No bleeding observed.  Follow up with PCP.  I find it unlikely he would consent to colon cancer screening.   Dyslipidemia            The [facility] Controlled Substances Registry was reviewed for this patient  prior to discharge.  Diet recommendation:  Discharge Diet Orders (From admission, onward)     Start     Ordered   07/12/23 0000  Diet - low sodium heart healthy        07/12/23 0923             DISCHARGE MEDICATION: Allergies as of 07/12/2023       Reactions   Other Itching, Other (See Comments)   Seasonal allergies- Itchy eyes, runny nose, congestion   Poison Ivy Extract Rash         Medication List     STOP taking these medications    amiodarone 200 MG tablet Commonly known as: PACERONE   QUEtiapine 50 MG tablet Commonly known as: SEROQUEL   thiamine 100 MG tablet Commonly known as: Vitamin B-1 Replaced by: thiamine 100 MG tablet       TAKE these medications    acetaminophen 500 MG tablet Commonly known as: TYLENOL Take 1 tablet (500 mg total) by mouth every 8 (eight) hours as needed. What changed:  how much to take when to take this reasons to take this   Eliquis 5 MG Tabs tablet Generic drug: apixaban Take 2 tablets daily (10 mg total) twice daily for 4 days then on Dec 27, reduce to 1 tablet (5 mg total) twice daily. What changed:  how much to take how to take this when to take this additional instructions   FeroSul 325 (65 Fe) MG tablet Generic drug: ferrous sulfate Take 1 tablet (325 mg total) by mouth daily with breakfast.   folic acid 1 MG tablet Commonly known as: FOLVITE Take 1 tablet (1 mg total) by mouth daily.   magnesium oxide 400 (240 Mg) MG tablet Commonly known as: MAG-OX Take 1 tablet (400 mg total) by mouth 2 (two) times daily.   metoprolol tartrate 25 MG tablet Commonly known as: LOPRESSOR Take 1 tablet (25 mg total) by mouth 2 (two) times daily.   mometasone-formoterol 200-5 MCG/ACT Aero Commonly known as: DULERA Inhale 2 puffs into the lungs 2 (two) times daily.   pantoprazole 40 MG tablet Commonly known as: PROTONIX Take 1 tablet (40 mg total) by mouth daily.   rosuvastatin 10 MG tablet Commonly known as: CRESTOR Take 1 tablet (10 mg total) by mouth daily.   thiamine 100 MG tablet Commonly known as: VITAMIN B1 Take 1 tablet (100 mg total) by mouth daily. Replaces: thiamine 100 MG tablet        Follow-up Information      [facility] Follow up on 08/05/2023.   Why: Dr. Georgian Co @10 :10am. Contact information: [address]  [place] [address] [phone]                Discharge Instructions     Amb referral to AFIB Clinic   Complete by: As directed    Diet - low sodium heart healthy   Complete by: As directed    Discharge instructions   Complete by: As directed    **IMPORTANT DISCHARGE INSTRUCTIONS**   From Dr. Maryfrances Bunnell: You were admitted for trouble breathing and leg swelling.  This was from heart failure caused by atrial fibrillation  We also found that you have blood clots in your chest  You were started on a blood thinner and were given medicines to control your heart rate.  Both of these appear to be working very well for you.  Atrial fibrillation is a chronic  condition that cannot be cured, but can be managed  Blood clots are a condition which can be cured with blood thinners  To manage the atrial fibrillation, take the medicine metoprolol twice daily every day This will not cure the problem, but it will control the heart rate, which will prevent another episode of heart failure  Go see your doctor as soon as you can We have set you up with a new primary care doctor, and have also sent a referral to a Cardiologist (heart specialist)  They will call you with the appointment  To manage the blood clots, take the blood thinner apixaban/Eliquis twice daily  Take this every day from now on.  If you do not take the metoprolol, likely your atrial fibrillation will speed up again and you will just develop another episode of heart failure in a few weeks or months (trouble breathing, unable to lay down, lungs filling with fluids, legs swelling)  If you do not take the blood thinner, you may or may not have problems, but a substantial number of patients with untreated blood clots in the lungs have another clot happen that causes them to die.    Refusal to take the blood thinner may lead to this outcome (death)   Increase activity slowly   Complete by: As directed        Discharge  Exam: Filed Weights   07/10/23 0443 07/11/23 0500 07/12/23 0344  Weight: 70.1 kg 68.4 kg 67.1 kg    General: Pt is alert, awake, not in acute distress Cardiovascular: RRR, nl S1-S2, no murmurs appreciated.   No LE edema.   Respiratory: Normal respiratory rate and rhythm.  CTAB without rales or wheezes. Abdominal: Abdomen soft and non-tender.  No distension or HSM.   Neuro/Psych: Strength symmetric in upper and lower extremities.  Judgment and insight appear at baseline.   Condition at discharge: fair  The results of significant diagnostics from this hospitalization (including imaging, microbiology, ancillary and laboratory) are listed below for reference.   Imaging Studies: CT Head Wo Contrast Result Date: 07/12/2023 CLINICAL DATA:  Head trauma, minor (Age >= 65y); Neck trauma (Age >= 65y) EXAM: CT HEAD WITHOUT CONTRAST CT CERVICAL SPINE WITHOUT CONTRAST TECHNIQUE: Multidetector CT imaging of the head and cervical spine was performed following the standard protocol without intravenous contrast. Multiplanar CT image reconstructions of the cervical spine were also generated. RADIATION DOSE REDUCTION: This exam was performed according to the departmental dose-optimization program which includes automated exposure control, adjustment of the mA and/or kV according to patient size and/or use of iterative reconstruction technique. COMPARISON:  CT Head 07/08/23 FINDINGS: CT HEAD FINDINGS Brain: No hemorrhage. No hydrocephalus. No extra-axial fluid collection. No CT evidence of an acute cortical infarct. No mass effect. No mass lesion. Vascular: No hyperdense vessel or unexpected calcification. Skull: Normal. Negative for fracture or focal lesion. Sinuses/Orbits: No middle ear or mastoid effusion. Paranasal sinuses are clear. Bilateral lens replacement. Orbits are otherwise unremarkable. Other: None. CT CERVICAL SPINE FINDINGS Alignment: Grade 1 anterolisthesis of C3 on C4. Trace retrolisthesis of C4 on  C5. Skull base and vertebrae: No acute fracture. No primary bone lesion or focal pathologic process. Soft tissues and spinal canal: No prevertebral fluid or swelling. No visible canal hematoma. Disc levels:  No CT evidence of high-grade spinal canal stenosis Upper chest: Right-sided pleural effusion, partially imaged. Other: None IMPRESSION: 1. No CT evidence of intracranial injury. 2. No acute fracture or traumatic subluxation of the cervical spine. Electronically Signed  By: Lorenza Cambridge M.D.   On: 07/12/2023 18:17   CT Cervical Spine Wo Contrast Result Date: 07/12/2023 CLINICAL DATA:  Head trauma, minor (Age >= 65y); Neck trauma (Age >= 65y) EXAM: CT HEAD WITHOUT CONTRAST CT CERVICAL SPINE WITHOUT CONTRAST TECHNIQUE: Multidetector CT imaging of the head and cervical spine was performed following the standard protocol without intravenous contrast. Multiplanar CT image reconstructions of the cervical spine were also generated. RADIATION DOSE REDUCTION: This exam was performed according to the departmental dose-optimization program which includes automated exposure control, adjustment of the mA and/or kV according to patient size and/or use of iterative reconstruction technique. COMPARISON:  CT Head 07/08/23 FINDINGS: CT HEAD FINDINGS Brain: No hemorrhage. No hydrocephalus. No extra-axial fluid collection. No CT evidence of an acute cortical infarct. No mass effect. No mass lesion. Vascular: No hyperdense vessel or unexpected calcification. Skull: Normal. Negative for fracture or focal lesion. Sinuses/Orbits: No middle ear or mastoid effusion. Paranasal sinuses are clear. Bilateral lens replacement. Orbits are otherwise unremarkable. Other: None. CT CERVICAL SPINE FINDINGS Alignment: Grade 1 anterolisthesis of C3 on C4. Trace retrolisthesis of C4 on C5. Skull base and vertebrae: No acute fracture. No primary bone lesion or focal pathologic process. Soft tissues and spinal canal: No prevertebral fluid or  swelling. No visible canal hematoma. Disc levels:  No CT evidence of high-grade spinal canal stenosis Upper chest: Right-sided pleural effusion, partially imaged. Other: None IMPRESSION: 1. No CT evidence of intracranial injury. 2. No acute fracture or traumatic subluxation of the cervical spine. Electronically Signed   By: Lorenza Cambridge M.D.   On: 07/12/2023 18:17   DG Chest 2 View Result Date: 07/12/2023 CLINICAL DATA:  Altered mental status EXAM: CHEST - 2 VIEW COMPARISON:  07/10/2023 FINDINGS: Midline trachea. Mild cardiomegaly. Small bilateral pleural effusions. Mild interstitial edema. Persistent bibasilar airspace disease. Hyperinflation. Remote distal right clavicle fracture. Old bilateral rib fractures. IMPRESSION: Mild congestive heart failure with small bilateral pleural effusions. Bibasilar Airspace disease, likely atelectasis. Electronically Signed   By: Jeronimo Greaves M.D.   On: 07/12/2023 17:20   DG CHEST PORT 1 VIEW Result Date: 07/10/2023 CLINICAL DATA:  Shortness of breath EXAM: PORTABLE CHEST 1 VIEW COMPARISON:  07/07/2023 FINDINGS: Stable cardiomediastinal silhouette. Aortic atherosclerotic calcification. Low lung volumes. Bibasilar atelectasis or infiltrates. Probable small bilateral pleural effusions. No pneumothorax. No displaced rib fractures. IMPRESSION: Low lung volumes with bibasilar atelectasis or infiltrates. Electronically Signed   By: Minerva Fester M.D.   On: 07/10/2023 13:44   ECHOCARDIOGRAM COMPLETE Result Date: 07/08/2023    ECHOCARDIOGRAM REPORT   Patient Name:   Anthony A Higginbotham Jr. Date of Exam: 07/08/2023 Medical Rec #:  [id number]        Height:       68.0 in Accession #:    [id number]       Weight:       140.0 lb Date of Birth:  May 05, 1956        BSA:          1.756 m Patient Age:    69 years         BP: Patient Gender: M                HR:           86 bpm. Exam Location:  Inpatient Procedure: 2D Echo, Cardiac Doppler and Color Doppler  Referring Phys: [id number]  VASUNDHRA RATHORE IMPRESSIONS  1. Hypokinesis of the inferior wall from the base to the mid wall. Left  ventricular ejection fraction, by estimation, is 45 to 50%. The left ventricle has mildly decreased function. The left ventricle has no regional wall motion abnormalities. Left ventricular diastolic parameters are indeterminate.  2. Right ventricular systolic function is mildly reduced. The right ventricular size is moderately enlarged. There is normal pulmonary artery systolic pressure.  3. Left atrial size was moderately dilated.  4. Right atrial size was mild to moderately dilated.  5. The mitral valve is degenerative. Mild to moderate mitral valve regurgitation.  6. The aortic valve was not well visualized. Aortic valve regurgitation is not visualized.  7. The inferior vena cava is normal in size with <50% respiratory variability, suggesting right atrial pressure of 8 mmHg. Conclusion(s)/Recommendation(s): Compared to prior study, MR has improved. EF has improved as well as RV function. FINDINGS  Left Ventricle: Hypokinesis of the inferior wall from the base to the mid wall. Left ventricular ejection fraction, by estimation, is 45 to 50%. The left ventricle has mildly decreased function. The left ventricle has no regional wall motion abnormalities. The left ventricular internal cavity size was normal in size. There is no left ventricular hypertrophy. Left ventricular diastolic parameters are indeterminate. Right Ventricle: The right ventricular size is moderately enlarged. Right ventricular systolic function is mildly reduced. There is normal pulmonary artery systolic pressure. The tricuspid regurgitant velocity is 1.88 m/s, and with an assumed right atrial pressure of 8 mmHg, the estimated right ventricular systolic pressure is 22.1 mmHg. Left Atrium: Left atrial size was moderately dilated. Right Atrium: Right atrial size was mild to moderately dilated. Pericardium: There is no evidence of pericardial  effusion. Mitral Valve: The mitral valve is degenerative in appearance. Mild to moderate mitral valve regurgitation. Tricuspid Valve: Tricuspid valve regurgitation is trivial. Aortic Valve: The aortic valve was not well visualized. Aortic valve regurgitation is not visualized. Pulmonic Valve: Pulmonic valve regurgitation is not visualized. Aorta: The aortic root and ascending aorta are structurally normal, with no evidence of dilitation. Venous: The inferior vena cava is normal in size with less than 50% respiratory variability, suggesting right atrial pressure of 8 mmHg. IAS/Shunts: The interatrial septum was not well visualized.  LEFT VENTRICLE PLAX 2D LVIDd:         4.65 cm      Diastology LVIDs:         4.10 cm      LV e' medial:    8.81 cm/s LV PW:         0.90 cm      LV E/e' medial:  8.5 LV IVS:        0.80 cm      LV e' lateral:   9.79 cm/s LVOT diam:     2.10 cm      LV E/e' lateral: 7.6 LV SV:         41 LV SV Index:   23 LVOT Area:     3.46 cm  LV Volumes (MOD) LV vol d, MOD A2C: 88.1 ml LV vol d, MOD A4C: 107.0 ml LV vol s, MOD A2C: 70.8 ml LV vol s, MOD A4C: 54.5 ml LV SV MOD A2C:     17.3 ml LV SV MOD A4C:     107.0 ml LV SV MOD BP:      39.0 ml RIGHT VENTRICLE RV S prime:     8.49 cm/s LEFT ATRIUM           Index        RIGHT ATRIUM  Index LA diam:      4.80 cm 2.73 cm/m   RA Area:     22.30 cm LA Vol (A4C): 74.4 ml 42.36 ml/m  RA Volume:   69.80 ml  39.74 ml/m  AORTIC VALVE LVOT Vmax:   77.30 cm/s LVOT Vmean:  51.000 cm/s LVOT VTI:    0.118 m  AORTA Ao Root diam: 3.40 cm Ao Asc diam:  3.40 cm MITRAL VALVE               TRICUSPID VALVE MV Area (PHT): 3.56 cm    TR Peak grad:   14.1 mmHg MV Decel Time: 213 msec    TR Vmax:        188.00 cm/s MV E velocity: 74.70 cm/s                            SHUNTS                            Systemic VTI:  0.12 m                            Systemic Diam: 2.10 cm Carolan Clines Electronically signed by Carolan Clines Signature Date/Time: 07/08/2023/5:52:38 PM     Final    CT HEAD WO CONTRAST ( ) Result Date: 07/08/2023 CLINICAL DATA:  Chronic falls, need for anticoagulation EXAM: CT HEAD WITHOUT CONTRAST TECHNIQUE: Contiguous axial images were obtained from the base of the skull through the vertex without intravenous contrast. RADIATION DOSE REDUCTION: This exam was performed according to the departmental dose-optimization program which includes automated exposure control, adjustment of the mA and/or kV according to patient size and/or use of iterative reconstruction technique. COMPARISON:  02/03/2023 FINDINGS: Brain: No evidence of acute infarction, hemorrhage, mass, mass effect, or midline shift. No hydrocephalus or extra-axial fluid collection. Vascular: No hyperdense vessel. Skull: Negative for fracture or focal lesion. Sinuses/Orbits: No acute finding. Other: The mastoid air cells are well aerated. IMPRESSION: No acute intracranial process. Electronically Signed   By: Wiliam Ke M.D.   On: 07/08/2023 17:44   VAS [place] LOWER EXTREMITY VENOUS (DVT) Result Date: 07/08/2023  Lower Venous DVT Study Patient Name:  Anthony A Hawks Jr.  Date of Exam:   07/08/2023 Medical Rec #: [id number]         Accession #:    [id number] Date of Birth: [birth year]         Patient Gender: M Patient Age:   62 years Exam Location:  [facility] Procedure:      VAS [place] LOWER EXTREMITY VENOUS (DVT) Referring Phys: Ulyess Blossom RATHORE --------------------------------------------------------------------------------  Indications: Pain, Swelling, and pulmonary embolism.  Comparison Study: 02/04/23 - Negative Performing Technologist: Marilynne Halsted RDMS, RVT  Examination Guidelines: A complete evaluation includes B-mode imaging, spectral Doppler, color Doppler, and power Doppler as needed of all accessible portions of each vessel. Bilateral testing is considered an integral part of a complete examination. Limited examinations for reoccurring indications may be performed as noted. The reflux  portion of the exam is performed with the patient in reverse Trendelenburg.  +---------+---------------+---------+-----------+----------+--------------+ RIGHT    CompressibilityPhasicitySpontaneityPropertiesThrombus Aging +---------+---------------+---------+-----------+----------+--------------+ CFV      Full           Yes      Yes                                 +---------+---------------+---------+-----------+----------+--------------+  SFJ      Full                                                        +---------+---------------+---------+-----------+----------+--------------+ FV Prox  Full                                                        +---------+---------------+---------+-----------+----------+--------------+ FV Mid   Full                                                        +---------+---------------+---------+-----------+----------+--------------+ FV DistalFull                                                        +---------+---------------+---------+-----------+----------+--------------+ PFV      Full                                                        +---------+---------------+---------+-----------+----------+--------------+ POP      Full           Yes      Yes                                 +---------+---------------+---------+-----------+----------+--------------+ PTV      Full                                                        +---------+---------------+---------+-----------+----------+--------------+ PERO     Full                                                        +---------+---------------+---------+-----------+----------+--------------+   +---------+---------------+---------+-----------+----------+-----------------+ LEFT     CompressibilityPhasicitySpontaneityPropertiesThrombus Aging    +---------+---------------+---------+-----------+----------+-----------------+ CFV      Full           Yes       Yes                                    +---------+---------------+---------+-----------+----------+-----------------+ SFJ      Full                                                           +---------+---------------+---------+-----------+----------+-----------------+  FV Prox  Full                                                           +---------+---------------+---------+-----------+----------+-----------------+ FV Mid   Full                                                           +---------+---------------+---------+-----------+----------+-----------------+ FV DistalFull                                                           +---------+---------------+---------+-----------+----------+-----------------+ PFV      Full                                                           +---------+---------------+---------+-----------+----------+-----------------+ POP      Full           Yes      Yes                                    +---------+---------------+---------+-----------+----------+-----------------+ PTV      Full                                                           +---------+---------------+---------+-----------+----------+-----------------+ PERO                                                  Poorly visualized +---------+---------------+---------+-----------+----------+-----------------+     Summary: RIGHT: - There is no evidence of deep vein thrombosis in the lower extremity.  - No cystic structure found in the popliteal fossa.  LEFT: - There is no evidence of deep vein thrombosis in the lower extremity. However, portions of this examination were limited- see technologist comments above.  *See table(s) above for measurements and observations. Electronically signed by Carolynn Sayers on 07/08/2023 at 12:05:13 PM.    Final    CT Angio Chest PE W and/or Wo Contrast Result Date: 07/07/2023 CLINICAL DATA:  High probability pulmonary embolism,  tachycardia, leg swelling EXAM: CT ANGIOGRAPHY CHEST WITH CONTRAST TECHNIQUE: Multidetector CT imaging of the chest was performed using the standard protocol during bolus administration of intravenous contrast. Multiplanar CT image reconstructions and MIPs were obtained to evaluate the vascular anatomy. RADIATION DOSE REDUCTION: This exam was performed according to the departmental dose-optimization program which includes automated exposure control, adjustment of the mA and/or kV according to patient  size and/or use of iterative reconstruction technique. CONTRAST:  75mL OMNIPAQUE IOHEXOL 350 MG/ML SOLN COMPARISON:  None Available. FINDINGS: Cardiovascular: There is adequate opacification of the pulmonary arterial tree. In intraluminal filling defect is identified within the right lower lobar pulmonary artery in keeping with an acute pulmonary embolus. The embolic burden is moderate. The central pulmonary arteries are enlarged in keeping with changes of pulmonary arterial hypertension. There is reversal of normal RV/LV ratio, (RV/LV = 1.2), however, this is stable since prior examination 03/06/2023 and there is therefore equivocal evidence for progressive right heart strain. There is mild cardiomegaly with dilation of the right heart chambers and reflux of contrast into the hepatic venous system in keeping elevated right heart pressure and some degree of right heart failure. Moderate coronary artery calcification. No pericardial effusion. Thoracic aorta is of normal caliber. Minimal atherosclerotic calcification within the descending thoracic aorta. Mediastinum/Nodes: Shotty right hilar and mediastinal adenopathy may be reactive in nature. No frankly pathologic adenopathy within the abdomen and pelvis. Esophagus unremarkable. Visualized thyroid is unremarkable. Lungs/Pleura: Moderate right pleural effusion. Compressive atelectasis of the right lower lobe. Subpleural ground-glass pulmonary infiltrate within the left  upper lobe, axial image # 50/5 is nonspecific, possibly infectious or inflammatory in the acute setting. No pneumothorax. No pleural effusion on the left. Small layering debris within the right bronchus intermedius. Upper Abdomen: No acute abnormality. Musculoskeletal: Osseous structures are age-appropriate. No acute bone abnormality. Multiple healed bilateral rib fractures are again identified. Review of the MIP images confirms the above findings. IMPRESSION: 1. Acute pulmonary embolus with moderate embolic burden. Stable relative dilation of the right heart chambers and equivocal evidence for progressive right heart strain as described above. 2. Mild cardiomegaly with dilation of the right heart chambers and reflux of contrast into the hepatic venous system in keeping with elevated right heart pressure and some degree of right heart failure. 3. Moderate coronary artery calcification. 4. Moderate right pleural effusion with compressive atelectasis of the right lower lobe. 5. Subpleural ground-glass pulmonary infiltrate within the left upper lobe, nonspecific, possibly infectious or inflammatory in the acute setting. 6. Shotty right hilar and mediastinal adenopathy, likely reactive in nature. Aortic Atherosclerosis (ICD10-I70.0). Electronically Signed   By: Helyn Numbers M.D.   On: 07/07/2023 22:25   DG Chest 2 View Result Date: 07/07/2023 CLINICAL DATA:  Dyspnea EXAM: CHEST - 2 VIEW COMPARISON:  03/08/2023 FINDINGS: Left costophrenic angle is partially excluded. Small right pleural effusion. There is progressive central pulmonary vascular congestion without overt pulmonary edema. No pneumothorax. Mild left basilar atelectasis. Cardiac size within normal limits. No acute bone abnormality. IMPRESSION: 1. Progressive central pulmonary vascular congestion without overt pulmonary edema. 2. New small right pleural effusion. Electronically Signed   By: Helyn Numbers M.D.   On: 07/07/2023 22:14     Microbiology: Results for orders placed or performed during the hospital encounter of 07/07/23  Blood culture (routine x 2)     Status: None   Collection Time: 07/07/23  9:39 PM   Specimen: BLOOD LEFT ARM  Result Value Ref Range Status   Specimen Description BLOOD LEFT ARM  Final   Special Requests   Final    BOTTLES DRAWN AEROBIC AND ANAEROBIC Blood Culture adequate volume   Culture   Final    NO GROWTH 5 DAYS Performed at [facility], [address], [place], [place] [address]    Report Status 07/12/2023 FINAL  Final  Blood culture (routine x 2)     Status:  None   Collection Time: 07/07/23  9:48 PM   Specimen: BLOOD RIGHT HAND  Result Value Ref Range Status   Specimen Description BLOOD RIGHT HAND  Final   Special Requests   Final    BOTTLES DRAWN AEROBIC AND ANAEROBIC Blood Culture results may not be optimal due to an inadequate volume of blood received in culture bottles   Culture   Final    NO GROWTH 5 DAYS Performed at [facility], [address] [place], [place] [address]    Report Status 07/12/2023 FINAL  Final    Labs: CBC: Recent Labs  Lab 07/07/23 1955 07/08/23 0422 07/09/23 0724 07/10/23 0250 07/11/23 0730 07/12/23 1801  WBC 4.1 4.0 3.8* 2.1* 7.9 4.4  NEUTROABS 1.8  --  1.6*  --  5.8 1.9  HGB 10.0* 9.4* 9.8* 10.4* 9.7* 10.0*  HCT 31.4* 28.4* 30.5* 32.9* 31.7* 32.3*  MCV 99.4 95.6 96.2 98.8 101.3* 102.2*  PLT 190 179 217 213 239 243   Basic Metabolic Panel: Recent Labs  Lab 07/07/23 1955 07/08/23 0422 07/09/23 0724 07/10/23 0250 07/11/23 0730 07/12/23 0246 07/12/23 1801  NA 132* 133* 134* 135 137 135 136  K 4.5 3.3* 3.5 3.9 3.8 4.0 3.9  CL 101 102 103 106 106 108 107  CO2 15* 21* 23 23 23  19* 23  GLUCOSE 69* 72 90 180* 108* 45* 116*  BUN 13 10 13 16  25* 25* 21  CREATININE 0.80 0.78 0.86 0.70 1.00 0.96 0.90  CALCIUM 8.7* 8.2* 8.0* 8.1* 9.1 8.3* 8.8*  MG 1.3* 1.2* 1.5*  --  1.4* 2.1  --   PHOS  --   --  3.6  --  3.4  --   --     Liver Function Tests: Recent Labs  Lab 07/08/23 0422 07/09/23 0724 07/11/23 0730 07/12/23 1801  AST 47* 40 37 66*  ALT 20 18 20  36  ALKPHOS 134* 124 107 106  BILITOT 1.6* 1.0 0.8 0.6  PROT 6.5 5.7* 6.2* 6.6  ALBUMIN 3.0* 2.6* 2.8* 3.0*   CBG: Recent Labs  Lab 07/11/23 2118 07/12/23 0005 07/12/23 0340 07/12/23 0416 07/12/23 0831  GLUCAP 125* 93 65* 126* 75    Discharge time spent: approximately 35 minutes spent on discharge counseling, evaluation of patient on day of discharge, and coordination of discharge planning with nursing, social work, pharmacy and case management  Signed: Alberteen Sam, MD [facility] 07/12/2023

## 2023-07-12 NOTE — Telephone Encounter (Signed)
Patient left clinic prior to safe transport arrival. Seen safely walking down [facility].  Terisa Starr, MD  [facility] Medicine Teaching Service

## 2023-07-12 NOTE — Telephone Encounter (Signed)
Patient walks into [facility] asking to be taken to Emergency Room. Ambulating with minimal issue. Patient is NOT a patient at [facility]. He was discharged about 2 hours ago from [facility].  No pain symptoms just concerned about battery on phone and concussion . Called security to take to ED ---they asked [place] to call safe transport to take to ED. Called safe transport for transfer to ED.   Terisa Starr, MD  Family Medicine Teaching Service

## 2023-07-12 NOTE — Care Management Important Message (Signed)
Important Message  Patient Details  Name: Anthony A Hammac Jr. MRN: [id number] Date of Birth: 1955/12/07   Important Message Given:  Yes - Medicare IM     Renie Ora 07/12/2023, 10:13 AM

## 2023-07-12 NOTE — ED Notes (Signed)
Pt to CT

## 2023-07-13 MED ORDER — APIXABAN 5 MG PO TABS: 5.0000 mg | ORAL_TABLET | Freq: Two times a day (BID) | ORAL | Status: DC

## 2023-07-13 MED ORDER — FOLIC ACID 1 MG PO TABS
1.0000 mg | ORAL_TABLET | Freq: Every day | ORAL | Status: DC
  Administered 2023-07-13: 1 mg via ORAL
  Filled 2023-07-13: qty 1

## 2023-07-13 MED ORDER — FERROUS SULFATE 325 (65 FE) MG PO TABS
325.0000 mg | ORAL_TABLET | Freq: Every day | ORAL | Status: DC
  Administered 2023-07-13: 325 mg via ORAL
  Filled 2023-07-13: qty 1

## 2023-07-13 MED ORDER — ACETAMINOPHEN 500 MG PO TABS: 500.0000 mg | ORAL_TABLET | Freq: Three times a day (TID) | ORAL | Status: DC | PRN

## 2023-07-13 MED ORDER — ROSUVASTATIN CALCIUM 5 MG PO TABS
10.0000 mg | ORAL_TABLET | Freq: Every day | ORAL | Status: DC
  Administered 2023-07-13: 10 mg via ORAL
  Filled 2023-07-13: qty 2

## 2023-07-13 MED ORDER — APIXABAN 5 MG PO TABS
10.0000 mg | ORAL_TABLET | Freq: Two times a day (BID) | ORAL | Status: DC
  Administered 2023-07-13 (×2): 10 mg via ORAL
  Filled 2023-07-13 (×2): qty 2

## 2023-07-13 MED ORDER — FUROSEMIDE 20 MG PO TABS
40.0000 mg | ORAL_TABLET | Freq: Once | ORAL | Status: AC
  Administered 2023-07-13: 40 mg via ORAL
  Filled 2023-07-13: qty 2

## 2023-07-13 MED ORDER — METOPROLOL TARTRATE 25 MG PO TABS
25.0000 mg | ORAL_TABLET | Freq: Two times a day (BID) | ORAL | Status: DC
  Administered 2023-07-13 (×2): 25 mg via ORAL
  Filled 2023-07-13 (×2): qty 1

## 2023-07-13 NOTE — ED Notes (Signed)
Pt given turkey sandwich per request

## 2023-07-13 NOTE — ED Notes (Signed)
Patient is up and walking around in room. Pushing call light buttons but not knowing how or why he called.

## 2023-07-13 NOTE — ED Provider Notes (Signed)
Case management and social work have arranged for him to go to [facility] and have given him resources to have outpatient support.  Charge.   Virgina Norfolk, DO 07/13/23 1337

## 2023-07-13 NOTE — ED Notes (Signed)
Patient is in his room and turned his backpack inside out, had all his belongings all over the bedside table and all over his bed. All of his clothes were all over the floor. When asked what he was doing he said he was cleaning his backpack out and going through some things he needed to throw away. Asked patient to clean up after himself and pick up clothes and items from floor due to tripping hazard. Pt agreed.

## 2023-07-13 NOTE — ED Notes (Signed)
PT at bedside.

## 2023-07-13 NOTE — Evaluation (Signed)
Speech Language Pathology Evaluation Patient Details Name: Anthony A Pinch Jr. MRN: [id number] DOB: 12/29/1955 Today's Date: 07/13/2023 Time: 7829-5621 SLP Time Calculation (min) (ACUTE ONLY): 28 min  Problem List:  Patient Active Problem List   Diagnosis Date Noted   History of CVA (cerebrovascular accident) 07/11/2023   Acute pulmonary embolism (HCC) 07/08/2023   Lactic acidosis 07/08/2023   Rib fractures 02/04/2023   Hypoglycemia 02/04/2023   Pulmonary embolus (HCC) 02/04/2023   Pulmonary embolism (HCC) 02/03/2023   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 difficile colitis 09/11/2022   Aspiration pneumonia (HCC) 08/27/2022   Aortic atherosclerosis (HCC) 08/27/2022   Depression, unspecified 08/09/2022   Hyponatremia 06/01/2022   Non compliance w medication regimen 05/31/2022   Delirium 05/25/2022   Stricture and stenosis of esophagus 05/21/2022   Gastritis and gastroduodenitis 05/21/2022   Loose stools 05/20/2022   Malnutrition of moderate degree 05/20/2022   Acute on chronic heart failure (HCC)    Leukocytosis 05/19/2022   Sepsis (HCC) 05/19/2022   Cellulitis of left leg, possible 05/19/2022    Class: Question of   Moniliasis, interdigital 05/19/2022   Open toe wound, right foot, medial fourth digit 05/19/2022   Malnutrition (HCC) 05/19/2022   Alcohol abuse 03/24/2022   Dyslipidemia 03/24/2022   Acute on chronic diastolic CHF (congestive heart failure) (HCC) 03/24/2022   Acute respiratory failure with hypoxia (HCC) 03/23/2022   Generalized weakness    Atrial fibrillation with rapid ventricular response (HCC) 03/15/2022   Hypokalemia 03/15/2022   Decreased functional mobility and  endurance 06/22/2021   Iron deficiency anemia 10/16/2020   Cough    Hypomagnesemia    Protein-calorie malnutrition, severe 08/22/2020   Alcohol use disorder, severe, in controlled environment (HCC)    Pressure injury of skin 08/18/2020   Urinary tract infection without hematuria    Severe sepsis (HCC) 08/14/2020   Paroxysmal atrial fibrillation (HCC) 08/13/2020   Rhabdomyolysis 07/22/2020   Cerebral ventriculomegaly 07/22/2020   Hemoglobin decreased 07/22/2020   History of delirium tremons 07/22/2020   Hypoalbuminemia due to protein-calorie malnutrition (HCC) 07/22/2020   Tobacco use disorder 07/22/2020   Alcohol withdrawal delirium (HCC)    Dehydration    Alcohol abuse with alcohol-induced mood disorder (HCC) 07/18/2018   Depression 05/26/2018   Anxiety state 04/25/2018   Need for immunization against influenza 04/25/2018   Alcohol withdrawal (HCC) 09/01/2017   DOE (dyspnea on exertion) 11/27/2016   Actinic keratosis 11/27/2016   Macrocytic anemia 11/23/2016   History of pulmonary embolus (PE) 11/02/2016   Hiatal hernia 09/14/2016   Skin cancer 08/13/2016   Poor social situation 06/09/2013   Tinea versicolor 06/08/2013   Back pain 05/07/2013   Seborrheic dermatitis of scalp 11/08/2012   HTN (hypertension) 04/11/2012   Alcohol use disorder, severe, dependence (HCC) 09/16/2006   Past Medical History:  Past Medical History:  Diagnosis Date   Actinic keratosis 11/27/2016   Alcohol abuse with alcohol-induced mood disorder (HCC) 07/18/2018   Alcohol use disorder, severe, dependence (HCC) 09/16/2006   05/22/2015 Argumentative, belligerent and verbally abusive to staff per his note from [place]    Alcoholism [facility])    Anxiety state 04/25/2018   Aortic atherosclerosis (HCC) 08/27/2022   Chronic low back pain 09/16/2006   Followed by NS.  Per  his chart from [place], he fell off two storyhouse roof while cleaning his brother's gutters and sustained compression fracture  of L3, L4 and L5 and fracture of right transverse process at L3 and nondisplaced fracture of left glenoid rim many years ago. He had multiple imaging in [place] including Lumbar MRI in 2016 which showed moderate to severe spinal canal stenos   Delirium tremens (HCC) 09/01/2017   Depression    Dry eye 11/23/2016   Foreign body in middle portion of esophagus 05/19/2022   Hiatal hernia 09/14/2016   Status Collis gastroplasty and Nissen's fundoplication on 04/29/2015 in [place]   History of delirium tremons 07/22/2020   DTs during 10/2016 08/2017. And 12/2017 admissions    History of pulmonary embolus (PE) 11/02/2016   Unprovoked 11/01/2016. Xarelto from 11/01/16 to 09/08/2017.   Hydrocele    Surgically corrected   Hypertension    Hypoalbuminemia due to protein-calorie malnutrition (HCC) 07/22/2020   Lumbar compression fracture (HCC)    Multiple rib fractures 07/22/2019   Left rib details XR: left ribs demonstrate multiple remote rib   Pancytopenia (HCC)    Rhabdomyolysis 07/22/2020   Skin cancer    exciced 2017   Tinea versicolor 06/08/2013   Tobacco use disorder 07/22/2020   Tooth infection 04/25/2018   Trigger finger, acquired 11/18/2012   Ulnar tunnel syndrome of right wrist 11/08/2012   Past Surgical History:  Past Surgical History:  Procedure Laterality Date   BIOPSY  05/21/2022   Procedure: BIOPSY;  Surgeon: Lynann Bologna, MD;  Location: [facility] ENDOSCOPY;  Service: Gastroenterology;;   CATARACT EXTRACTION     ESOPHAGOGASTRODUODENOSCOPY (EGD) WITH PROPOFOL N/A 05/21/2022   Procedure: ESOPHAGOGASTRODUODENOSCOPY (EGD) WITH PROPOFOL;  Surgeon: Lynann Bologna, MD;  Location: [facility] ENDOSCOPY;  Service: Gastroenterology;  Laterality: N/A;   FOREIGN BODY REMOVAL N/A 05/21/2022   Procedure: FOREIGN BODY REMOVAL;  Surgeon: Lynann Bologna, MD;  Location: [facility] ENDOSCOPY;  Service: Gastroenterology;  Laterality: N/A;   HIATAL HERNIA REPAIR  2016   HYDROCELE EXCISION / REPAIR  2010   SKIN  CANCER EXCISION Left    Excised 2017   HPI:  67 y.o. male who was found by EMS wandering outside ER looking for entrance. Pt was just discharged from hospital on 12/23 then found outside confused brought back into the hospital and admitted. Previously pt presented to [facility] hospital on 07/07/2023 with SOB and BLE edema. Pt found to be in afib with RVR, also ETOH positive.CTA with findings of PE. Pt also with moderate R pleural effusion and LUL groundglass pulmonary infiltrate. PMH includes alcohol abuse, HTN, CVA, HFmrEF, afib, PE, HLD, anxiety, depression, chronic low back pain. Pt had a previous cognitive-linguistic evaluation from SLP in May 2024 that showed deficits in the areas of sustained attention, awareness, short term memory and reasoning. Pt was also verbose and tangential. SLP subsequently signed off as cognitive function was suspected to be near baseline.   Assessment / Plan / Recommendation Clinical Impression  Pt scored a 16/30 on the SLUMS, suggestive of cognitive impairment. Throughout testing he had difficulty sustaining attention and had difficulty with comprehension of instructions, especially with the clock drawing subtest, which he did not attempt because it "did not make sense" to him. He is tangential and verbose, asking repeatedly about SLP finding housing for him even with repeated education about SLP role. He believes his cognitive impairments have declined over the last year as a result of "multiple concussions." Presentation seems similar to most recent SLP eval as well as OT eval  a few days ago. Do not think he needs SLP f/u acutely, but he would benefit from increased assistance and potentially follow up at next level of care.    SLP Assessment  SLP Recommendation/Assessment: All further Speech Lanaguage Pathology  needs can be addressed in the next venue of care SLP Visit Diagnosis: Cognitive communication deficit (R41.841)    Recommendations for follow up therapy are one  component of a multi-disciplinary discharge planning process, led by the attending physician.  Recommendations may be updated based on patient status, additional functional criteria and insurance authorization.    Follow Up Recommendations  Other (comment) (could consider SLP at next level of care if baseline level of function is determined)    Assistance Recommended at Discharge  Frequent or constant Supervision/Assistance  Functional Status Assessment Patient has not had a recent decline in their functional status  Frequency and Duration           SLP Evaluation Cognition  Overall Cognitive Status: No family/caregiver present to determine baseline cognitive functioning Arousal/Alertness: Awake/alert Orientation Level: Oriented to person;Oriented to time;Oriented to place Attention: Sustained Sustained Attention: Impaired Sustained Attention Impairment: Verbal basic;Verbal complex Memory: Impaired Memory Impairment: Storage deficit;Retrieval deficit Awareness: Impaired Awareness Impairment: Other (comment) (online) Problem Solving: Impaired Problem Solving Impairment: Verbal complex;Functional complex Safety/Judgment: Impaired       Comprehension  Auditory Comprehension Overall Auditory Comprehension: Impaired Commands: Impaired Interfering Components: Attention;Processing speed EffectiveTechniques: Extra processing time;Repetition    Expression Expression Primary Mode of Expression: Verbal Verbal Expression Overall Verbal Expression: Impaired Initiation: No impairment Automatic Speech: Name;Social Response Level of Generative/Spontaneous Verbalization: Conversation Pragmatics: Impairment Impairments: Topic appropriateness;Topic maintenance;Turn Taking Interfering Components: Attention   Oral / Motor  Motor Speech Overall Motor Speech: Appears within functional limits for tasks assessed            Mahala Menghini., M.A. CCC-SLP Acute Rehabilitation Services Office  [phone]  Secure chat preferred  07/13/2023, 12:58 PM

## 2023-07-13 NOTE — Evaluation (Signed)
Physical Therapy Evaluation Patient Details Name: Anthony A Mahn Jr. MRN: [id number] DOB: 07-28-1955 Today's Date: 07/13/2023  History of Present Illness  67 y.o. male who was found by EMS wandering outside ER looking for entrance. Pt was just discharged from hospital on 12/23 then found outside confused brought back into the hospital and admitted. Previously pt presented to [facility] hospital on 07/07/2023 with SOB and BLE edema. Pt found to be in afib with RVR, also ETOH positive.CTA with findings of PE. Pt also with moderate R pleural effusion and LUL groundglass pulmonary infiltrate. PMH includes alcohol abuse, HTN, CVA, HFmrEF, afib, PE, HLD, anxiety, depression, chronic low back pain.  Clinical Impression  Pt is presenting close to baseline. Pt has some balance deficits with higher level activities including single leg stance able to hold for 2 seconds and reports disorientation when stepping down curbs; would benefit from [facility] for depth perception. This most likely is pt baseline but due to housing difficulties pt would benefit from as much balance training as possible in order to decrease risk for falls and injury. Pt has memory deficits that are most likely affecting pt balance and safety as well; referred to occupational therapy and spoke with MD about possible SLP cognitive evaluation. Due to pt current functional status, home set up and available assistance at home no recommended skilled physical therapy services at this time on discharge from acute care hospital setting. Will continue to follow in acute setting in order to ensure that pt is dischraged with decreased risk for falls, injury, re-hospitalization and improved activity tolerance.          If plan is discharge home, recommend the following: Assist for transportation     Equipment Recommendations Cane     Functional Status Assessment Patient has not had a recent decline in their functional status     Precautions / Restrictions  Precautions Precautions: Fall Restrictions Weight Bearing Restrictions Per Provider Order: No      Mobility  Bed Mobility Overal bed mobility: Modified Independent    Transfers Overall transfer level: Modified independent Equipment used: None Transfers: Sit to/from Stand Sit to Stand: Modified independent (Device/Increase time)    Ambulation/Gait Ambulation/Gait assistance: Modified independent (Device/Increase time) Gait Distance (Feet): 50 Feet Assistive device: None Gait Pattern/deviations: Step-through pattern Gait velocity: reduced Gait velocity interpretation: 1.31 - 2.62 ft/sec, indicative of limited community ambulator   General Gait Details: slow and cautious gait.      Balance Overall balance assessment: Needs assistance Sitting-balance support: No upper extremity supported, Feet supported Sitting balance-Leahy Scale: Fair     Standing balance support: No upper extremity supported, During functional activity Standing balance-Leahy Scale: Fair Standing balance comment: pt was bending over, looking through bags on the floor. No LOB or difficulty         Pertinent Vitals/Pain Pain Assessment Pain Assessment: No/denies pain    Home Living Family/patient expects to be discharged to:: Shelter/Homeless           Additional Comments: Pt states that if he goes out on the streets he will die. He states he has no where to go and was going [facility] that is closed now. Case management states this place is still open. Pt most likely is unaware due to memory deficits.    Prior Function Prior Level of Function : Independent/Modified Independent             Mobility Comments: ambulating without DME recently as he lost all of his  prior equipment ADLs Comments: He reported being independent with ADLs, given housing insecurity and that he has to do for himself and has for 65 years. (pt is 27) When correct pt was unsure     Extremity/Trunk  Assessment   Upper Extremity Assessment Upper Extremity Assessment: Overall WFL for tasks assessed    Lower Extremity Assessment Lower Extremity Assessment: Overall WFL for tasks assessed    Cervical / Trunk Assessment Cervical / Trunk Assessment: Normal  Communication   Communication Communication: No apparent difficulties Cueing Techniques: Verbal cues  Cognition Arousal: Alert Behavior During Therapy: Anxious, Restless Overall Cognitive Status: No family/caregiver present to determine baseline cognitive functioning       General Comments: pt digging through bags on the floor looking for a hat. Pt wandering in socks. States he has had multiple concussions this year? Unsure if this is accurate. Pt was aware that he had blood clots but did not know what to call them stating he had "Igou things inside him" when prompted pt was able to understand "blood clot" pt state he has no family near by that can assist him. He reports he once qualified for living with a roommate but didn't take it. He was unable to give clear directions for where this was located stating it was somewhere down [employer] near a park. He reports he moves pretty good except when stepping down curbs.        General Comments General comments (skin integrity, edema, etc.): No signs/symptoms of distress.        Assessment/Plan    PT Assessment Patient needs continued PT services  PT Problem List Decreased balance;Decreased knowledge of use of DME       PT Treatment Interventions DME instruction;Gait training;Functional mobility training;Stair training;Therapeutic activities;Therapeutic exercise;Balance training;Neuromuscular re-education;Patient/family education    PT Goals (Current goals can be found in the Care Plan section)  Acute Rehab PT Goals Patient Stated Goal: Find somewhere to live and not fall PT Goal Formulation: With patient Time For Goal Achievement: 07/27/23 Potential to Achieve Goals:  Fair    Frequency Min 1X/week        AM-PAC PT "6 Clicks" Mobility  Outcome Measure Help needed turning from your back to your side while in a flat bed without using bedrails?: None Help needed moving from lying on your back to sitting on the side of a flat bed without using bedrails?: None Help needed moving to and from a bed to a chair (including a wheelchair)?: None Help needed standing up from a chair using your arms (e.g., wheelchair or bedside chair)?: None Help needed to walk in hospital room?: None Help needed climbing 3-5 steps with a railing? : A Schatz 6 Click Score: 23    End of Session   Activity Tolerance: Patient tolerated treatment well Patient left: with call bell/phone within reach (pt was standing in room when arrived left sitting edge of stretcher.) Nurse Communication: Mobility status PT Visit Diagnosis: Other abnormalities of gait and mobility (R26.89);History of falling (Z91.81)    Time: 2130-8657 PT Time Calculation (min) (ACUTE ONLY): 23 min   Charges:   PT Evaluation $PT Eval Low Complexity: 1 Low PT Treatments $Therapeutic Activity: 8-22 mins PT General Charges $$ ACUTE PT VISIT: 1 Visit        Harrel Carina, DPT, CLT  Acute Rehabilitation Services Office: [phone] (Secure chat preferred)   Claudia Desanctis 07/13/2023, 11:18 AM

## 2023-07-13 NOTE — Progress Notes (Signed)
OT Cancellation Note  Patient Details Name: Anthony A Kipnis Jr. MRN: [id number] DOB: 1955-10-27   Cancelled Treatment:    Reason Eval/Treat Not Completed: OT screened, no needs identified, will sign off. PT saw and no change in status since prior admission; cognitive impairments present at baseline unchanged from this OT eval 3 days ago; since then this pt has left AMA. Reached out to CM last admission about housing which is pt's predominant concern   Anthony [name], OTR/L [facility] Acute Rehabilitation Office: [phone]   Myrla Halsted 07/13/2023, 11:32 AM

## 2023-07-13 NOTE — ED Notes (Signed)
Pt continues to get up from bed, walk around , fold linens and organize personal belongings; pt temporarily redirectable at this time; Safety sitter ordered; per CN, no sitter available at this time

## 2023-07-13 NOTE — ED Notes (Signed)
Patient verbalizes understanding of discharge instructions. Opportunity for questioning and answers were provided. Pt discharged from ED. 

## 2023-07-13 NOTE — Discharge Instructions (Signed)
Please contact Anthony [name] at [phone] to inquire about a room in a boarding house in [place]. The price per month is $620.

## 2023-07-13 NOTE — Progress Notes (Signed)
CSW attempted to reach staff at [facility] and [facility] without success.  CSW spoke with PT who states patient has some memory deficits but states he does not need any PT follow up.  CSW spoke with [facility] supervisor who suggested CSW speak with Jinny Sanders, owner of boarding houses here in [place].  CSW spoke with Kathie Rhodes at [phone] who states she does not have any rooms open but states she will on Friday. Kathie Rhodes states the rate per month is $620. Kathie Rhodes agreeable for CSW to give patient her contact information.  Edwin Dada, MSW, LCSW Transitions of Care  Clinical Social Worker II [phone]

## 2023-07-13 NOTE — ED Notes (Signed)
Pt resting with eyes closed; respirations spontaneous, even, unlabored 

## 2023-07-13 NOTE — ED Notes (Signed)
Patient is up again, walking around his room. Looking in his back pack for no apparent reason. Looking outside his room, asking what time the buses start running in the morning and if he can stay until then. I told him he was admitted and to have a seat in bed, I have some meds for him. Pt agreed.

## 2023-07-13 NOTE — ED Notes (Signed)
SLP at bedside.

## 2023-07-14 ENCOUNTER — Encounter (HOSPITAL_COMMUNITY): Payer: Self-pay

## 2023-07-14 ENCOUNTER — Emergency Department (HOSPITAL_COMMUNITY)
Admission: EM | Admit: 2023-07-14 | Discharge: 2023-07-15 | Disposition: A | Discharge status: Home / Self Care | Payer: 59 | Attending: Emergency Medicine | Admitting: Emergency Medicine

## 2023-07-14 DIAGNOSIS — Z7901 Long term (current) use of anticoagulants: Secondary | ICD-10-CM | POA: Diagnosis not present

## 2023-07-14 DIAGNOSIS — I110 Hypertensive heart disease with heart failure: Secondary | ICD-10-CM | POA: Diagnosis not present

## 2023-07-14 DIAGNOSIS — R350 Frequency of micturition: Secondary | ICD-10-CM | POA: Insufficient documentation

## 2023-07-14 DIAGNOSIS — Z86711 Personal history of pulmonary embolism: Secondary | ICD-10-CM | POA: Insufficient documentation

## 2023-07-14 DIAGNOSIS — Z8673 Personal history of transient ischemic attack (TIA), and cerebral infarction without residual deficits: Secondary | ICD-10-CM | POA: Diagnosis not present

## 2023-07-14 DIAGNOSIS — I480 Paroxysmal atrial fibrillation: Secondary | ICD-10-CM | POA: Diagnosis not present

## 2023-07-14 DIAGNOSIS — I509 Heart failure, unspecified: Secondary | ICD-10-CM | POA: Insufficient documentation

## 2023-07-14 DIAGNOSIS — Z79899 Other long term (current) drug therapy: Secondary | ICD-10-CM | POA: Insufficient documentation

## 2023-07-14 DIAGNOSIS — Z5900 Homelessness unspecified: Secondary | ICD-10-CM | POA: Diagnosis not present

## 2023-07-14 DIAGNOSIS — X31XXXA Exposure to excessive natural cold, initial encounter: Secondary | ICD-10-CM | POA: Diagnosis not present

## 2023-07-14 DIAGNOSIS — T699XXA Effect of reduced temperature, unspecified, initial encounter: Secondary | ICD-10-CM | POA: Diagnosis present

## 2023-07-14 DIAGNOSIS — Z720 Tobacco use: Secondary | ICD-10-CM | POA: Diagnosis not present

## 2023-07-14 DIAGNOSIS — T730XXA Starvation, initial encounter: Secondary | ICD-10-CM | POA: Diagnosis present

## 2023-07-14 DIAGNOSIS — I10 Essential (primary) hypertension: Secondary | ICD-10-CM | POA: Insufficient documentation

## 2023-07-14 DIAGNOSIS — R410 Disorientation, unspecified: Secondary | ICD-10-CM | POA: Diagnosis not present

## 2023-07-14 LAB — CBC WITH DIFFERENTIAL/PLATELET
Abs Immature Granulocytes: 0.01 10*3/uL (ref 0.00–0.07)
Basophils Absolute: 0.1 10*3/uL (ref 0.0–0.1)
Basophils Relative: 2 %
Eosinophils Absolute: 0.1 10*3/uL (ref 0.0–0.5)
Eosinophils Relative: 3 %
HCT: 28.8 % — ABNORMAL LOW (ref 39.0–52.0)
Hemoglobin: 9 g/dL — ABNORMAL LOW (ref 13.0–17.0)
Immature Granulocytes: 0 %
Lymphocytes Relative: 36 %
Lymphs Abs: 1.3 10*3/uL (ref 0.7–4.0)
MCH: 32 pg (ref 26.0–34.0)
MCHC: 31.3 g/dL (ref 30.0–36.0)
MCV: 102.5 fL — ABNORMAL HIGH (ref 80.0–100.0)
Monocytes Absolute: 0.5 10*3/uL (ref 0.1–1.0)
Monocytes Relative: 15 %
Neutro Abs: 1.7 10*3/uL (ref 1.7–7.7)
Neutrophils Relative %: 44 %
Platelets: 222 10*3/uL (ref 150–400)
RBC: 2.81 MIL/uL — ABNORMAL LOW (ref 4.22–5.81)
RDW: 22 % — ABNORMAL HIGH (ref 11.5–15.5)
WBC: 3.7 10*3/uL — ABNORMAL LOW (ref 4.0–10.5)
nRBC: 0 % (ref 0.0–0.2)

## 2023-07-14 NOTE — ED Triage Notes (Addendum)
Pt was brought by medic after he stopped someone at the park and had them call 911 for him being cold.    Medic vitals   138bgl 99%ra 111hr 126/88

## 2023-07-14 NOTE — ED Provider Triage Note (Cosign Needed)
Emergency Medicine Provider Triage Evaluation Note  Anthony A Petrik Jr. , a 67 y.o. male  was evaluated in triage.  Pt complains of cold exposure.  Patient was sleeping in a park and a couple called 911 because he was cold.  No other complaints at this time..  Review of Systems  Positive: Cold exposure Negative: sob  Physical Exam  BP 115/85 (BP Location: Right Arm)   Pulse 96   Temp 98.2 F (36.8 C) (Oral)   Resp 17   SpO2 100%  Gen:   Awake, no distress   Resp:  Normal effort  MSK:   Moves extremities without difficulty  Other:    Medical Decision Making  Medically screening exam initiated at 9:15 PM.  Appropriate orders placed.  Anthony A Kerschner Jr. was informed that the remainder of the evaluation will be completed by another provider, this initial triage assessment does not replace that evaluation, and the importance of remaining in the ED until their evaluation is complete.     Arthor Captain, PA-C 07/14/23 2116

## 2023-07-15 ENCOUNTER — Emergency Department (HOSPITAL_COMMUNITY): Payer: 59

## 2023-07-15 ENCOUNTER — Other Ambulatory Visit: Payer: Self-pay

## 2023-07-15 ENCOUNTER — Emergency Department (HOSPITAL_COMMUNITY)
Admission: EM | Admit: 2023-07-15 | Discharge: 2023-07-15 | Disposition: A | Discharge status: Home / Self Care | Payer: 59 | Source: Home / Self Care | Attending: Emergency Medicine | Admitting: Emergency Medicine

## 2023-07-15 ENCOUNTER — Encounter (HOSPITAL_COMMUNITY): Payer: Self-pay

## 2023-07-15 DIAGNOSIS — T730XXA Starvation, initial encounter: Secondary | ICD-10-CM | POA: Insufficient documentation

## 2023-07-15 DIAGNOSIS — Z5900 Homelessness unspecified: Secondary | ICD-10-CM | POA: Insufficient documentation

## 2023-07-15 DIAGNOSIS — Z7901 Long term (current) use of anticoagulants: Secondary | ICD-10-CM | POA: Insufficient documentation

## 2023-07-15 LAB — URINALYSIS, ROUTINE W REFLEX MICROSCOPIC
Bilirubin Urine: NEGATIVE
Glucose, UA: NEGATIVE mg/dL
Hgb urine dipstick: NEGATIVE
Ketones, ur: NEGATIVE mg/dL
Leukocytes,Ua: NEGATIVE
Nitrite: NEGATIVE
Protein, ur: NEGATIVE mg/dL
Specific Gravity, Urine: 1.018 (ref 1.005–1.030)
pH: 6 (ref 5.0–8.0)

## 2023-07-15 LAB — COMPREHENSIVE METABOLIC PANEL
ALT: 38 U/L (ref 0–44)
AST: 63 U/L — ABNORMAL HIGH (ref 15–41)
Albumin: 3.1 g/dL — ABNORMAL LOW (ref 3.5–5.0)
Alkaline Phosphatase: 91 U/L (ref 38–126)
Anion gap: 10 (ref 5–15)
BUN: 23 mg/dL (ref 8–23)
CO2: 21 mmol/L — ABNORMAL LOW (ref 22–32)
Calcium: 8.9 mg/dL (ref 8.9–10.3)
Chloride: 109 mmol/L (ref 98–111)
Creatinine, Ser: 0.84 mg/dL (ref 0.61–1.24)
GFR, Estimated: >60 mL/min (ref >60)
Glucose, Bld: 132 mg/dL — ABNORMAL HIGH (ref 70–99)
Potassium: 3.8 mmol/L (ref 3.5–5.1)
Sodium: 140 mmol/L (ref 135–145)
Total Bilirubin: 0.7 mg/dL (ref <1.2)
Total Protein: 6.5 g/dL (ref 6.5–8.1)

## 2023-07-15 LAB — RAPID URINE DRUG SCREEN, HOSP PERFORMED
Amphetamines: NOT DETECTED
Barbiturates: NOT DETECTED
Benzodiazepines: POSITIVE — AB
Cocaine: NOT DETECTED
Opiates: NOT DETECTED
Tetrahydrocannabinol: NOT DETECTED

## 2023-07-15 LAB — ETHANOL: Alcohol, Ethyl (B): <10 mg/dL (ref <10)

## 2023-07-15 LAB — AMMONIA: Ammonia: 35 umol/L (ref 9–35)

## 2023-07-15 NOTE — ED Provider Notes (Signed)
[place] EMERGENCY DEPARTMENT AT [facility] Provider Note   CSN: [id number] Arrival date & time: 07/15/23  0445     History  Chief Complaint  Patient presents with   Requesting food    Anthony [name]. is a 67 y.o. male.  Patient evaluated and discharged earlier this evening from the same ED presents complaining of feeling cold and being hungry.  Patient was discharged and remained in the lobby.  He was reportedly wandering around the lobby when he checked back and requesting food.  Upon my assessment he complains that he was given a cold sandwich and request hot chocolate.  He has no other new complaints at this time.  Past medical history significant for homelessness, alcohol use disorder, pulmonary embolism, medication noncompliance  HPI     Home Medications Prior to Admission medications   Medication Sig Start Date End Date Taking? Authorizing Provider  acetaminophen (TYLENOL) 500 MG tablet Take 1 tablet (500 mg total) by mouth every 8 (eight) hours as needed. Patient taking differently: Take 1,000 mg by mouth every 6 (six) hours as needed (for pain). 10/13/22   Leatha Gilding, MD  apixaban (ELIQUIS) 5 MG TABS tablet Take 2 tablets daily (10 mg total) twice daily for 4 days then on Dec 27, reduce to 1 tablet (5 mg total) twice daily. 07/12/23   Danford, Earl Lites, MD  ferrous sulfate 325 (65 FE) MG tablet Take 1 tablet (325 mg total) by mouth daily with breakfast. 07/12/23 08/11/23  Danford, Earl Lites, MD  folic acid (FOLVITE) 1 MG tablet Take 1 tablet (1 mg total) by mouth daily. 07/12/23   Danford, Earl Lites, MD  magnesium oxide (MAG-OX) 400 (240 Mg) MG tablet Take 1 tablet (400 mg total) by mouth 2 (two) times daily. Patient not taking: Reported on 07/08/2023 02/16/23   Leroy Sea, MD  metoprolol tartrate (LOPRESSOR) 25 MG tablet Take 1 tablet (25 mg total) by mouth 2 (two) times daily. 07/12/23 10/10/23  DanfordEarl Lites, MD   mometasone-formoterol (DULERA) 200-5 MCG/ACT AERO Inhale 2 puffs into the lungs 2 (two) times daily. Patient not taking: Reported on 07/08/2023 02/16/23   Leroy Sea, MD  pantoprazole (PROTONIX) 40 MG tablet Take 1 tablet (40 mg total) by mouth daily. Patient not taking: Reported on 07/08/2023 02/17/23   Leroy Sea, MD  rosuvastatin (CRESTOR) 10 MG tablet Take 1 tablet (10 mg total) by mouth daily. 07/12/23 10/10/23  DanfordEarl Lites, MD  thiamine (VITAMIN B1) 100 MG tablet Take 1 tablet (100 mg total) by mouth daily. 07/12/23   DanfordEarl Lites, MD      Allergies    Other and Poison ivy extract    Review of Systems   Review of Systems  Physical Exam Updated Vital Signs BP 117/81 (BP Location: Right Arm)   Pulse 98   Temp 97.6 F (36.4 C) (Oral)   Resp 16   SpO2 97%  Physical Exam Vitals and nursing note reviewed.  HENT:     Head: Normocephalic and atraumatic.  Eyes:     Pupils: Pupils are equal, round, and reactive to light.  Pulmonary:     Effort: Pulmonary effort is normal. No respiratory distress.  Musculoskeletal:        General: No signs of injury.     Cervical back: Normal range of motion.  Skin:    General: Skin is dry.  Neurological:     Mental Status: He is alert.  Psychiatric:        Speech: Speech normal.        Behavior: Behavior normal.     ED Results / Procedures / Treatments   Labs (all labs ordered are listed, but only abnormal results are displayed) Labs Reviewed - No data to display  EKG None  Radiology CT Head Wo Contrast Result Date: 07/15/2023 CLINICAL DATA:  Mental status change, unknown cause EXAM: CT HEAD WITHOUT CONTRAST TECHNIQUE: Contiguous axial images were obtained from the base of the skull through the vertex without intravenous contrast. RADIATION DOSE REDUCTION: This exam was performed according to the departmental dose-optimization program which includes automated exposure control, adjustment of the mA  and/or kV according to patient size and/or use of iterative reconstruction technique. COMPARISON:  07/12/2023 CT head FINDINGS: Brain: No evidence of acute infarction, hemorrhage, mass, mass effect, or midline shift. No hydrocephalus or extra-axial fluid collection. Vascular: No hyperdense vessel. Skull: Negative for fracture or focal lesion. Sinuses/Orbits: Mild mucosal thickening in the ethmoid air cells. Status post bilateral lens replacements. Other: The mastoid air cells are well aerated. IMPRESSION: No acute intracranial process. Electronically Signed   By: Wiliam Ke M.D.   On: 07/15/2023 01:22    Procedures Procedures    Medications Ordered in ED Medications - No data to display  ED Course/ Medical Decision Making/ A&P                                 Medical Decision Making  Patient with no new complaints other than hunger and feeling cold.  Patient had extensive workup earlier with various complaints which was grossly benign.  This included extensive laboratory workup along with head CT.  There is no indication for further emergent workup at this time.  Patient appears to be here for secondary gain at this time. Patient discharged.         Final Clinical Impression(s) / ED Diagnoses Final diagnoses:  Homelessness  Hunger pain, initial encounter    Rx / DC Orders ED Discharge Orders     None         Pamala Duffel 07/15/23 0541    Tilden Fossa, MD 07/15/23 0700

## 2023-07-15 NOTE — ED Provider Notes (Signed)
EMERGENCY DEPARTMENT AT [facility] Provider Note   CSN: [id number] Arrival date & time: 07/14/23  2102     History  Chief Complaint  Patient presents with   Cold Exposure    Anthony [name]. is a 67 y.o. male. Patient presents to the emergency department complaining of being cold and confusion.  He has a history of homelessness, alcohol use disorder, hypertension, PE, anxiety, paroxysmal atrial fibrillation, UTIs, history of stroke.  He was admitted to the hospital in December 18 and discharged on December 23 due to an acute pulmonary embolism.  During the patient's admission he reported intention to not take any medications at discharge as he felt that he did not trust anyone at the hospital.  Evaluation at that time reported some possible mild cognitive impairment but PT and OT did not recommend acute rehab.  He was started on Eliquis, iron, folic acid, magnesium, metoprolol, thiamine at discharge.  Patient is unclear tonight as to his main complaint.  He endorses distrust of medicine and endorses distrust of law enforcement.  He states he has had pain due to lumbar fracture from falling off his brothers roof.  He does not feel the people are helping him.  He was seen in the emergency department on the 23rd with multiple unrelated complaints.  They discussed the patient with the medicine team at that time who did not feel he needed readmission.  At this time he denies shortness of breath, chest pain, abdominal pain, nausea, vomiting.  He does endorse having to urinate frequently after being given a dose of Lasix at the emergency department on the 23rd.  HPI     Home Medications Prior to Admission medications   Medication Sig Start Date End Date Taking? Authorizing Provider  acetaminophen (TYLENOL) 500 MG tablet Take 1 tablet (500 mg total) by mouth every 8 (eight) hours as needed. Patient taking differently: Take 1,000 mg by mouth every 6 (six) hours as needed (for  pain). 10/13/22   Leatha Gilding, MD  apixaban (ELIQUIS) 5 MG TABS tablet Take 2 tablets daily (10 mg total) twice daily for 4 days then on Dec 27, reduce to 1 tablet (5 mg total) twice daily. 07/12/23   Danford, Earl Lites, MD  ferrous sulfate 325 (65 FE) MG tablet Take 1 tablet (325 mg total) by mouth daily with breakfast. 07/12/23 08/11/23  Danford, Earl Lites, MD  folic acid (FOLVITE) 1 MG tablet Take 1 tablet (1 mg total) by mouth daily. 07/12/23   Danford, Earl Lites, MD  magnesium oxide (MAG-OX) 400 (240 Mg) MG tablet Take 1 tablet (400 mg total) by mouth 2 (two) times daily. Patient not taking: Reported on 07/08/2023 02/16/23   Leroy Sea, MD  metoprolol tartrate (LOPRESSOR) 25 MG tablet Take 1 tablet (25 mg total) by mouth 2 (two) times daily. 07/12/23 10/10/23  DanfordEarl Lites, MD  mometasone-formoterol (DULERA) 200-5 MCG/ACT AERO Inhale 2 puffs into the lungs 2 (two) times daily. Patient not taking: Reported on 07/08/2023 02/16/23   Leroy Sea, MD  pantoprazole (PROTONIX) 40 MG tablet Take 1 tablet (40 mg total) by mouth daily. Patient not taking: Reported on 07/08/2023 02/17/23   Leroy Sea, MD  rosuvastatin (CRESTOR) 10 MG tablet Take 1 tablet (10 mg total) by mouth daily. 07/12/23 10/10/23  DanfordEarl Lites, MD  thiamine (VITAMIN B1) 100 MG tablet Take 1 tablet (100 mg total) by mouth daily. 07/12/23   Danford, Earl Lites, MD  Allergies    Other and Poison ivy extract    Review of Systems   Review of Systems  Physical Exam Updated Vital Signs BP 94/70 (BP Location: Left Arm)   Pulse 64   Temp 98.2 F (36.8 C) (Oral)   Resp 16   SpO2 100%  Physical Exam Vitals and nursing note reviewed.  Constitutional:      General: He is not in acute distress.    Appearance: He is well-developed. He is not diaphoretic.  HENT:     Head: Normocephalic and atraumatic.  Eyes:     Conjunctiva/sclera: Conjunctivae normal.  Cardiovascular:      Rate and Rhythm: Normal rate and regular rhythm.     Heart sounds: Normal heart sounds. No murmur heard.    No friction rub. No gallop.  Pulmonary:     Effort: Pulmonary effort is normal. No respiratory distress.     Breath sounds: Normal breath sounds. No wheezing or rales.  Abdominal:     General: There is no distension.     Palpations: Abdomen is soft.     Tenderness: There is no abdominal tenderness. There is no guarding.  Musculoskeletal:     Cervical back: Normal range of motion.     Right lower leg: Edema present.     Left lower leg: Edema (less than previous) present.  Skin:    General: Skin is warm and dry.  Neurological:     Mental Status: He is alert and oriented to person, place, and time.     ED Results / Procedures / Treatments   Labs (all labs ordered are listed, but only abnormal results are displayed) Labs Reviewed  CBC WITH DIFFERENTIAL/PLATELET - Abnormal; Notable for the following components:      Result Value   WBC 3.7 (*)    RBC 2.81 (*)    Hemoglobin 9.0 (*)    HCT 28.8 (*)    MCV 102.5 (*)    RDW 22.0 (*)    All other components within normal limits  COMPREHENSIVE METABOLIC PANEL - Abnormal; Notable for the following components:   CO2 21 (*)    Glucose, Bld 132 (*)    Albumin 3.1 (*)    AST 63 (*)    All other components within normal limits  RAPID URINE DRUG SCREEN, HOSP PERFORMED - Abnormal; Notable for the following components:   Benzodiazepines POSITIVE (*)    All other components within normal limits  ETHANOL  AMMONIA  URINALYSIS, ROUTINE W REFLEX MICROSCOPIC  VITAMIN B1    EKG None  Radiology CT Head Wo Contrast Result Date: 07/15/2023 CLINICAL DATA:  Mental status change, unknown cause EXAM: CT HEAD WITHOUT CONTRAST TECHNIQUE: Contiguous axial images were obtained from the base of the skull through the vertex without intravenous contrast. RADIATION DOSE REDUCTION: This exam was performed according to the departmental  dose-optimization program which includes automated exposure control, adjustment of the mA and/or kV according to patient size and/or use of iterative reconstruction technique. COMPARISON:  07/12/2023 CT head FINDINGS: Brain: No evidence of acute infarction, hemorrhage, mass, mass effect, or midline shift. No hydrocephalus or extra-axial fluid collection. Vascular: No hyperdense vessel. Skull: Negative for fracture or focal lesion. Sinuses/Orbits: Mild mucosal thickening in the ethmoid air cells. Status post bilateral lens replacements. Other: The mastoid air cells are well aerated. IMPRESSION: No acute intracranial process. Electronically Signed   By: Wiliam Ke M.D.   On: 07/15/2023 01:22    Procedures Procedures    Medications  Ordered in ED Medications - No data to display  ED Course/ Medical Decision Making/ A&P                                 Medical Decision Making Amount and/or Complexity of Data Reviewed Labs: ordered. Radiology: ordered.   This patient presents to the ED for concern of cold exposure and possible confusion, this involves an extensive number of treatment options, and is a complaint that carries with it a high risk of complications and morbidity.     Co morbidities that complicate the patient evaluation  Homelessness, history of alcohol abuse, hypertension, CVA, CHF, A-fib, PE, anxiety   Additional history obtained:  Additional history obtained from EMS External records from outside source obtained and reviewed including recent discharge summary   Lab Tests:  I Ordered, and personally interpreted labs.  The pertinent results include: UDS positive for benzodiazepines, hemoglobin 9.0 consistent with baseline, ethanol less than 10, ammonia 35   Imaging Studies ordered:  I ordered imaging studies including CT head without contrast I independently visualized and interpreted imaging which showed no acute intracranial process I agree with the radiologist  interpretation   Social Determinants of Health:  Patient is homeless, daily tobacco user   Test / Admission - Considered:  Patient with no significant acute findings on lab work or imaging today.  EMS reports patient initially requested help due to cold exposure due to homelessness.  Question if patient is here for secondary gain.  No signs of intoxication, encephalopathy. Patient does discuss distrust of healthcare establishment but seems alert and oriented at this time.  There is no indication for admission or further emergent workup. Patient provided resources from [facility] at visit on 12/23. Will provide shelter resource guide.          Final Clinical Impression(s) / ED Diagnoses Final diagnoses:  Homeless    Rx / DC Orders ED Discharge Orders     None         Pamala Duffel 07/15/23 0235    Tilden Fossa, MD 07/15/23 [id number]

## 2023-07-15 NOTE — ED Triage Notes (Signed)
Pt d/c from facility earlier to waiting room. Pt wondering halls and told had to stay in lobby and pt checked in for "food".   Pt not directly answering questions during triage, states "this is a disgusting place".   Sandwich tray and drink given per pt request due to "being hungry".

## 2023-07-15 NOTE — Discharge Instructions (Addendum)
Your workup tonight was reassuring.  Please take medication as prescribed at your recent hospitalization.  If you develop any life-threatening symptoms return to the emergency department.

## 2023-07-18 LAB — VITAMIN B1: Vitamin B1 (Thiamine): 165.8 nmol/L (ref 66.5–200.0)

## 2023-07-26 ENCOUNTER — Emergency Department (HOSPITAL_COMMUNITY)
Admission: EM | Admit: 2023-07-26 | Discharge: 2023-07-27 | Disposition: A | Discharge status: Home / Self Care | Payer: 59 | Source: Home / Self Care | Attending: Emergency Medicine | Admitting: Emergency Medicine

## 2023-07-26 DIAGNOSIS — T699XXA Effect of reduced temperature, unspecified, initial encounter: Secondary | ICD-10-CM | POA: Insufficient documentation

## 2023-07-26 DIAGNOSIS — Z7901 Long term (current) use of anticoagulants: Secondary | ICD-10-CM | POA: Insufficient documentation

## 2023-07-26 DIAGNOSIS — Z5900 Homelessness unspecified: Secondary | ICD-10-CM | POA: Insufficient documentation

## 2023-07-26 DIAGNOSIS — Z79899 Other long term (current) drug therapy: Secondary | ICD-10-CM | POA: Insufficient documentation

## 2023-07-26 DIAGNOSIS — X31XXXA Exposure to excessive natural cold, initial encounter: Secondary | ICD-10-CM | POA: Insufficient documentation

## 2023-07-26 DIAGNOSIS — R410 Disorientation, unspecified: Secondary | ICD-10-CM | POA: Insufficient documentation

## 2023-07-26 DIAGNOSIS — F10231 Alcohol dependence with withdrawal delirium: Secondary | ICD-10-CM | POA: Diagnosis not present

## 2023-07-26 NOTE — ED Triage Notes (Signed)
 Patient BIB EMS for cold exposure. Patient is homeless. Warming done en route by EMS. Patient is hurting all over per EMS. Unsure how he has been out in the cold, patient thinks he has been outside for at least 2 days. Patient states he takes a muscle relaxer but is out and he is hurting as well.    P 104 99% Cap Refill less 2 secs CBG 138

## 2023-07-27 ENCOUNTER — Emergency Department (HOSPITAL_COMMUNITY): Payer: 59

## 2023-07-27 ENCOUNTER — Encounter (HOSPITAL_COMMUNITY): Payer: Self-pay

## 2023-07-27 ENCOUNTER — Emergency Department (HOSPITAL_COMMUNITY)
Admission: EM | Admit: 2023-07-27 | Discharge: 2023-07-27 | Disposition: A | Discharge status: Home / Self Care | Payer: 59 | Source: Home / Self Care | Attending: Emergency Medicine | Admitting: Emergency Medicine

## 2023-07-27 DIAGNOSIS — Z5900 Homelessness unspecified: Secondary | ICD-10-CM

## 2023-07-27 LAB — COMPREHENSIVE METABOLIC PANEL
ALT: 17 U/L (ref 0–44)
AST: 26 U/L (ref 15–41)
Albumin: 3.6 g/dL (ref 3.5–5.0)
Alkaline Phosphatase: 86 U/L (ref 38–126)
Anion gap: 10 (ref 5–15)
BUN: 16 mg/dL (ref 8–23)
CO2: 22 mmol/L (ref 22–32)
Calcium: 8.9 mg/dL (ref 8.9–10.3)
Chloride: 109 mmol/L (ref 98–111)
Creatinine, Ser: 0.55 mg/dL — ABNORMAL LOW (ref 0.61–1.24)
GFR, Estimated: >60 mL/min (ref >60)
Glucose, Bld: 79 mg/dL (ref 70–99)
Potassium: 3.7 mmol/L (ref 3.5–5.1)
Sodium: 141 mmol/L (ref 135–145)
Total Bilirubin: 0.8 mg/dL (ref 0.0–1.2)
Total Protein: 7.2 g/dL (ref 6.5–8.1)

## 2023-07-27 LAB — CBG MONITORING, ED: Glucose-Capillary: 74 mg/dL (ref 70–99)

## 2023-07-27 LAB — CBC
HCT: 29.4 % — ABNORMAL LOW (ref 39.0–52.0)
Hemoglobin: 9.1 g/dL — ABNORMAL LOW (ref 13.0–17.0)
MCH: 32.6 pg (ref 26.0–34.0)
MCHC: 31 g/dL (ref 30.0–36.0)
MCV: 105.4 fL — ABNORMAL HIGH (ref 80.0–100.0)
Platelets: 217 10*3/uL (ref 150–400)
RBC: 2.79 MIL/uL — ABNORMAL LOW (ref 4.22–5.81)
RDW: 20 % — ABNORMAL HIGH (ref 11.5–15.5)
WBC: 3.1 10*3/uL — ABNORMAL LOW (ref 4.0–10.5)
nRBC: 0 % (ref 0.0–0.2)

## 2023-07-27 LAB — ETHANOL: Alcohol, Ethyl (B): <10 mg/dL (ref <10)

## 2023-07-27 NOTE — ED Provider Notes (Signed)
 [place] EMERGENCY DEPARTMENT AT [facility] Provider Note   CSN: [id number] Arrival date & time: 07/26/23  2139     History  No chief complaint on file.   Anthony A Goettel Mickey. is a 68 y.o. male.  HPI D70-year-old male history of homelessness and alcohol  use disorder presented last night complaining of hurting all over, being on the cold, stated he had been outside for 2 days.  He has been in the waiting room for over 12 hours.  He pulled back for reevaluation.  Patient states that he wishes to leave at this time.    Home Medications Prior to Admission medications   Medication Sig Start Date End Date Taking? Authorizing Provider  acetaminophen  (TYLENOL ) 500 MG tablet Take 1 tablet (500 mg total) by mouth every 8 (eight) hours as needed. Patient taking differently: Take 1,000 mg by mouth every 6 (six) hours as needed (for pain). 10/13/22   Gherghe, Costin M, MD  apixaban  (ELIQUIS ) 5 MG TABS tablet Take 2 tablets daily (10 mg total) twice daily for 4 days then on Dec 27, reduce to 1 tablet (5 mg total) twice daily. 07/12/23   Danford, Lonni SQUIBB, MD  ferrous sulfate  325 (65 FE) MG tablet Take 1 tablet (325 mg total) by mouth daily with breakfast. 07/12/23 08/11/23  Danford, Lonni SQUIBB, MD  folic acid  (FOLVITE ) 1 MG tablet Take 1 tablet (1 mg total) by mouth daily. 07/12/23   Danford, Lonni SQUIBB, MD  magnesium  oxide (MAG-OX) 400 (240 Mg) MG tablet Take 1 tablet (400 mg total) by mouth 2 (two) times daily. Patient not taking: Reported on 07/08/2023 02/16/23   Singh, Prashant K, MD  metoprolol  tartrate (LOPRESSOR ) 25 MG tablet Take 1 tablet (25 mg total) by mouth 2 (two) times daily. 07/12/23 10/10/23  DanfordLonni SQUIBB, MD  mometasone -formoterol  (DULERA ) 200-5 MCG/ACT AERO Inhale 2 puffs into the lungs 2 (two) times daily. Patient not taking: Reported on 07/08/2023 02/16/23   Dennise Lavada POUR, MD  pantoprazole  (PROTONIX ) 40 MG tablet Take 1 tablet (40 mg total) by mouth  daily. Patient not taking: Reported on 07/08/2023 02/17/23   Singh, Prashant K, MD  rosuvastatin  (CRESTOR ) 10 MG tablet Take 1 tablet (10 mg total) by mouth daily. 07/12/23 10/10/23  DanfordLonni SQUIBB, MD  thiamine  (VITAMIN B1) 100 MG tablet Take 1 tablet (100 mg total) by mouth daily. 07/12/23   Danford, Lonni SQUIBB, MD      Allergies    Other and Poison ivy extract    Review of Systems   Review of Systems  Physical Exam Updated Vital Signs BP 113/80 (BP Location: Right Arm)   Pulse (!) 102   Temp 98.8 F (37.1 C) (Oral)   Resp 16   SpO2 100%  Physical Exam Vitals reviewed.  HENT:     Head: Normocephalic and atraumatic.     Right Ear: External ear normal.     Left Ear: External ear normal.     Nose: Nose normal.     Mouth/Throat:     Pharynx: Oropharynx is clear.  Eyes:     Pupils: Pupils are equal, round, and reactive to light.  Musculoskeletal:     Cervical back: Normal range of motion.  Neurological:     Mental Status: He is alert.     Comments: Patient states that we are asking to many questions that he wishes to leave He is oriented to person but refuses to answer further questions Patient is ambulatory  Psychiatric:        Attention and Perception: Attention normal.        Mood and Affect: Affect is inappropriate.        Thought Content: Thought content normal.     ED Results / Procedures / Treatments   Labs (all labs ordered are listed, but only abnormal results are displayed) Labs Reviewed - No data to display  EKG None  Radiology No results found.  Procedures Procedures    Medications Ordered in ED Medications - No data to display  ED Course/ Medical Decision Making/ A&P                                 Medical Decision Making  68 year old male with known history of alcohol  abuse and homelessness presented last night due to cold.  On tempt evaluate today patient is not uncooperative.  I did call his brother [name]  Jeralyn states that he  is often like this and waxes and wanes from day-to-day.  He reports he has a history of going from hospital to hospital.  He states he has chronic back pain. Based on history from brother, this appears to be baseline behavior no acute changes.  Risk benefits of IVC considered.  Patient is adamant about leaving and would require physical restraints.  His brother feels that this is at baseline and will be allowed to leave.        Final Clinical Impression(s) / ED Diagnoses Final diagnoses:  Cold exposure, initial encounter    Rx / DC Orders ED Discharge Orders     None         Levander Houston, MD 07/27/23 1126

## 2023-07-27 NOTE — ED Provider Notes (Signed)
 Seen and evaluated earlier and left AMA.  At that time I spoke with his brother who stated that he was often like this and did not think he had any change in his capacity. In the interim is reported that he was found wandering in somebody's yard he was brought back to the ED. Laying on the bed now with his close on. Requested patient be disrobed and I have ordered CT, CBC, urine drug screen, EtOH, head CT, chest x-Anna-Marie Coller Vital signs reviewed with blood pressure 127/90 temperature 98.1 normal oxygen saturations and heart rate of 60    Levander Houston, MD 07/27/23 1437

## 2023-07-27 NOTE — Discharge Instructions (Signed)
 Your workup is unremarkable today.  In particular your scans and your labs were unremarkable  We are sending you to the [facility] so you have a place to stay tonight  Return to ER if you have lethargy, vomiting

## 2023-07-27 NOTE — ED Notes (Signed)
 Patient given cab voucher. Cab is here.

## 2023-07-27 NOTE — Care Management (Addendum)
 Transition of Care [facility]) - Emergency Department Mini Assessment   Patient Details  Name: Anthony A Tilley Jr. MRN: [id number] Date of Birth: [birth year]  Transition of [facility]) CM/SW Contact:    Stefan JONELLE Cory, RN Phone Number: 07/27/2023, 3:57 PM   Clinical Narrative: Received TOC consult for home health services. Per chart review patient is homeless and previously evaluated today however left AMA. Patient has now returned via EMS found wandering in someone's yard. This RNCM notified MD that there are 2 white flag warming centers open at 8pm today.  This RNCM spoke with [facility] (ph# [phone]- [address] Daisy Collier) who confirms they will be open at 8pm.  Notified TOC Sales Promotion Account Executive at discharge: taxi voucher  TOC following for discharge needs.  Left taxi voucher with paramedic Francis, notified that white flag warming center at [facility] opens at 8pm, as patient should dc prior to 8pm.  ED Mini Assessment: What brought you to the Emergency Department? : concerns of hypothermia and confusion  Barriers to Discharge: No Barriers Identified  Barrier interventions: provide white flag white flag warming centers     Interventions which prevented an admission or readmission: [facility]    Patient Contact and Communications        ,          Patient states their goals for this hospitalization and ongoing recovery are:: to feel better CMS Medicare.gov Compare Post Acute Care list provided to::  (n/a) Choice offered to / list presented to : Patient  Admission diagnosis:  Psych Eval Patient Active Problem List   Diagnosis Date Noted   History of CVA (cerebrovascular accident) 07/11/2023   Acute pulmonary embolism (HCC) 07/08/2023   Lactic acidosis 07/08/2023   Rib fractures 02/04/2023   Hypoglycemia 02/04/2023   Pulmonary embolus (HCC) 02/04/2023   Pulmonary embolism (HCC) 02/03/2023   Stroke (cerebrum) (HCC) 12/03/2022    Abnormal LFTs 12/02/2022   Traumatic pneumothorax, initial encounter 10/06/2022   SIRS (systemic inflammatory response syndrome) (HCC) 09/21/2022   Homelessness 09/21/2022   Atrial fibrillation (HCC) 09/12/2022   Moderate protein malnutrition (HCC) 09/11/2022   Chronic diarrhea 09/11/2022   C. difficile colitis 09/11/2022   Aspiration pneumonia (HCC) 08/27/2022   Aortic atherosclerosis (HCC) 08/27/2022   Depression, unspecified 08/09/2022   Hyponatremia 06/01/2022   Non compliance w medication regimen 05/31/2022   Delirium 05/25/2022   Stricture and stenosis of esophagus 05/21/2022   Gastritis and gastroduodenitis 05/21/2022   Loose stools 05/20/2022   Malnutrition of moderate degree 05/20/2022   Acute on chronic heart failure (HCC)    Leukocytosis 05/19/2022   Sepsis (HCC) 05/19/2022   Cellulitis of left leg, possible 05/19/2022    Class: Question of   Moniliasis, interdigital 05/19/2022   Open toe wound, right foot, medial fourth digit 05/19/2022   Malnutrition (HCC) 05/19/2022   Alcohol  abuse 03/24/2022   Dyslipidemia 03/24/2022   Acute on chronic diastolic CHF (congestive heart failure) (HCC) 03/24/2022   Acute respiratory failure with hypoxia (HCC) 03/23/2022   Generalized weakness    Atrial fibrillation with rapid ventricular response (HCC) 03/15/2022   Hypokalemia 03/15/2022   Decreased functional mobility and endurance 06/22/2021   Iron  deficiency anemia 10/16/2020   Cough    Hypomagnesemia    Protein-calorie malnutrition, severe 08/22/2020   Alcohol  use disorder, severe, in controlled environment (HCC)    Pressure injury of skin 08/18/2020   Urinary tract infection without hematuria    Severe sepsis (HCC) 08/14/2020  Paroxysmal atrial fibrillation (HCC) 08/13/2020   Rhabdomyolysis 07/22/2020   Cerebral ventriculomegaly 07/22/2020   Hemoglobin decreased 07/22/2020   History of delirium tremons 07/22/2020   Hypoalbuminemia due to protein-calorie malnutrition  (HCC) 07/22/2020   Tobacco use disorder 07/22/2020   Alcohol  withdrawal delirium (HCC)    Dehydration    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) 07/18/2018   Depression 05/26/2018   Anxiety state 04/25/2018   Need for immunization against influenza 04/25/2018   Alcohol  withdrawal (HCC) 09/01/2017   DOE (dyspnea on exertion) 11/27/2016   Actinic keratosis 11/27/2016   Macrocytic anemia 11/23/2016   History of pulmonary embolus (PE) 11/02/2016   Hiatal hernia 09/14/2016   Skin cancer 08/13/2016   Poor social situation 06/09/2013   Tinea versicolor 06/08/2013   Back pain 05/07/2013   Seborrheic dermatitis of scalp 11/08/2012   HTN (hypertension) 04/11/2012   Alcohol  use disorder, severe, dependence (HCC) 09/16/2006   PCP:  Pcp, No Pharmacy:   [facility] [phone] - [place], [place] - [address] AT [facility] [address] [place] [address] Phone: [phone] Fax: [phone]  [place] [facility] [address] [place] [address] Phone: [phone] Fax: [phone]

## 2023-07-27 NOTE — ED Provider Notes (Signed)
  Physical Exam  BP (!) 127/90   Pulse 60   Temp 98.1 F (36.7 C)   Resp 16   SpO2 100%   Physical Exam  Procedures  Procedures  ED Course / MDM    Medical Decision Making Patient was seen earlier and patient apparently eloped and then came back.  Dr. Levander saw him again.  Signed out pending CT head and chest x-ray and labs.  4:38 PM I reviewed patient's labs and they were unremarkable.  Alcohol  level is negative.  Patient is confused but per family earlier, this is baseline.  Patient is homeless.  Unfortunately is very cold out.  I contacted the social worker and [facility] is open due to the cold temperatures.  He will be transported by cab to the [facility]  Problems Addressed: Homeless: chronic illness or injury  Amount and/or Complexity of Data Reviewed Labs: ordered. Decision-making details documented in ED Course. Radiology: ordered and independent interpretation performed. Decision-making details documented in ED Course.          Patt Alm Macho, MD 07/27/23 [phone]

## 2023-07-27 NOTE — ED Triage Notes (Signed)
 PT BIBA for concern for patient safety concern for hypothermia and confusion.   Pt unable to provide coherent information to EMS.  PT is unable to provide coherent details at this time regarding symptoms and pain.

## 2023-07-28 ENCOUNTER — Inpatient Hospital Stay (HOSPITAL_COMMUNITY)
Admission: EM | Admit: 2023-07-28 | Discharge: 2023-08-27 | DRG: 896 | Disposition: A | Discharge status: Home / Self Care | Payer: 59 | Attending: Family Medicine | Admitting: Family Medicine

## 2023-07-28 ENCOUNTER — Other Ambulatory Visit: Payer: Self-pay

## 2023-07-28 ENCOUNTER — Encounter (HOSPITAL_COMMUNITY): Payer: Self-pay | Admitting: Internal Medicine

## 2023-07-28 DIAGNOSIS — Z5901 Sheltered homelessness: Secondary | ICD-10-CM

## 2023-07-28 DIAGNOSIS — Z7901 Long term (current) use of anticoagulants: Secondary | ICD-10-CM

## 2023-07-28 DIAGNOSIS — G9341 Metabolic encephalopathy: Secondary | ICD-10-CM | POA: Diagnosis present

## 2023-07-28 DIAGNOSIS — Z781 Physical restraint status: Secondary | ICD-10-CM

## 2023-07-28 DIAGNOSIS — Z8673 Personal history of transient ischemic attack (TIA), and cerebral infarction without residual deficits: Secondary | ICD-10-CM

## 2023-07-28 DIAGNOSIS — G4700 Insomnia, unspecified: Secondary | ICD-10-CM | POA: Diagnosis present

## 2023-07-28 DIAGNOSIS — Z823 Family history of stroke: Secondary | ICD-10-CM

## 2023-07-28 DIAGNOSIS — Z8249 Family history of ischemic heart disease and other diseases of the circulatory system: Secondary | ICD-10-CM

## 2023-07-28 DIAGNOSIS — Z818 Family history of other mental and behavioral disorders: Secondary | ICD-10-CM

## 2023-07-28 DIAGNOSIS — F10931 Alcohol use, unspecified with withdrawal delirium: Secondary | ICD-10-CM | POA: Diagnosis present

## 2023-07-28 DIAGNOSIS — F1014 Alcohol abuse with alcohol-induced mood disorder: Secondary | ICD-10-CM | POA: Diagnosis present

## 2023-07-28 DIAGNOSIS — F17290 Nicotine dependence, other tobacco product, uncomplicated: Secondary | ICD-10-CM | POA: Diagnosis present

## 2023-07-28 DIAGNOSIS — I5022 Chronic systolic (congestive) heart failure: Secondary | ICD-10-CM | POA: Diagnosis present

## 2023-07-28 DIAGNOSIS — F329 Major depressive disorder, single episode, unspecified: Secondary | ICD-10-CM | POA: Diagnosis present

## 2023-07-28 DIAGNOSIS — R451 Restlessness and agitation: Secondary | ICD-10-CM | POA: Diagnosis present

## 2023-07-28 DIAGNOSIS — I700 Atherosclerosis of aorta: Secondary | ICD-10-CM | POA: Diagnosis present

## 2023-07-28 DIAGNOSIS — E871 Hypo-osmolality and hyponatremia: Secondary | ICD-10-CM | POA: Diagnosis present

## 2023-07-28 DIAGNOSIS — I4819 Other persistent atrial fibrillation: Secondary | ICD-10-CM | POA: Diagnosis present

## 2023-07-28 DIAGNOSIS — M549 Dorsalgia, unspecified: Secondary | ICD-10-CM | POA: Diagnosis present

## 2023-07-28 DIAGNOSIS — R410 Disorientation, unspecified: Secondary | ICD-10-CM | POA: Diagnosis present

## 2023-07-28 DIAGNOSIS — I2510 Atherosclerotic heart disease of native coronary artery without angina pectoris: Secondary | ICD-10-CM | POA: Diagnosis present

## 2023-07-28 DIAGNOSIS — I110 Hypertensive heart disease with heart failure: Secondary | ICD-10-CM | POA: Diagnosis present

## 2023-07-28 DIAGNOSIS — F1024 Alcohol dependence with alcohol-induced mood disorder: Secondary | ICD-10-CM | POA: Diagnosis present

## 2023-07-28 DIAGNOSIS — I2699 Other pulmonary embolism without acute cor pulmonale: Secondary | ICD-10-CM | POA: Diagnosis present

## 2023-07-28 DIAGNOSIS — E876 Hypokalemia: Secondary | ICD-10-CM | POA: Diagnosis present

## 2023-07-28 DIAGNOSIS — I959 Hypotension, unspecified: Secondary | ICD-10-CM | POA: Diagnosis present

## 2023-07-28 DIAGNOSIS — J9811 Atelectasis: Secondary | ICD-10-CM | POA: Diagnosis not present

## 2023-07-28 DIAGNOSIS — Z9109 Other allergy status, other than to drugs and biological substances: Secondary | ICD-10-CM

## 2023-07-28 DIAGNOSIS — R471 Dysarthria and anarthria: Secondary | ICD-10-CM | POA: Diagnosis present

## 2023-07-28 DIAGNOSIS — Z7951 Long term (current) use of inhaled steroids: Secondary | ICD-10-CM

## 2023-07-28 DIAGNOSIS — E872 Acidosis, unspecified: Secondary | ICD-10-CM | POA: Diagnosis present

## 2023-07-28 DIAGNOSIS — Z5948 Other specified lack of adequate food: Secondary | ICD-10-CM

## 2023-07-28 DIAGNOSIS — D509 Iron deficiency anemia, unspecified: Secondary | ICD-10-CM | POA: Diagnosis present

## 2023-07-28 DIAGNOSIS — G8929 Other chronic pain: Secondary | ICD-10-CM | POA: Diagnosis present

## 2023-07-28 DIAGNOSIS — F10231 Alcohol dependence with withdrawal delirium: Principal | ICD-10-CM | POA: Diagnosis present

## 2023-07-28 DIAGNOSIS — F03918 Unspecified dementia, unspecified severity, with other behavioral disturbance: Secondary | ICD-10-CM | POA: Diagnosis present

## 2023-07-28 DIAGNOSIS — R2681 Unsteadiness on feet: Secondary | ICD-10-CM | POA: Diagnosis present

## 2023-07-28 DIAGNOSIS — E8809 Other disorders of plasma-protein metabolism, not elsewhere classified: Secondary | ICD-10-CM | POA: Diagnosis present

## 2023-07-28 DIAGNOSIS — Z5982 Transportation insecurity: Secondary | ICD-10-CM

## 2023-07-28 DIAGNOSIS — L570 Actinic keratosis: Secondary | ICD-10-CM | POA: Diagnosis present

## 2023-07-28 DIAGNOSIS — Z5900 Homelessness unspecified: Principal | ICD-10-CM

## 2023-07-28 DIAGNOSIS — Z8619 Personal history of other infectious and parasitic diseases: Secondary | ICD-10-CM

## 2023-07-28 DIAGNOSIS — F0393 Unspecified dementia, unspecified severity, with mood disturbance: Secondary | ICD-10-CM | POA: Diagnosis present

## 2023-07-28 DIAGNOSIS — Z91148 Patient's other noncompliance with medication regimen for other reason: Secondary | ICD-10-CM

## 2023-07-28 DIAGNOSIS — D539 Nutritional anemia, unspecified: Secondary | ICD-10-CM | POA: Diagnosis present

## 2023-07-28 DIAGNOSIS — F411 Generalized anxiety disorder: Secondary | ICD-10-CM | POA: Diagnosis present

## 2023-07-28 DIAGNOSIS — E785 Hyperlipidemia, unspecified: Secondary | ICD-10-CM | POA: Diagnosis present

## 2023-07-28 DIAGNOSIS — F05 Delirium due to known physiological condition: Secondary | ICD-10-CM | POA: Diagnosis present

## 2023-07-28 DIAGNOSIS — Z85828 Personal history of other malignant neoplasm of skin: Secondary | ICD-10-CM

## 2023-07-28 DIAGNOSIS — Z79899 Other long term (current) drug therapy: Secondary | ICD-10-CM

## 2023-07-28 DIAGNOSIS — F0394 Unspecified dementia, unspecified severity, with anxiety: Secondary | ICD-10-CM | POA: Diagnosis present

## 2023-07-28 DIAGNOSIS — Z86711 Personal history of pulmonary embolism: Secondary | ICD-10-CM

## 2023-07-28 DIAGNOSIS — Z5941 Food insecurity: Secondary | ICD-10-CM

## 2023-07-28 MED ORDER — ACETAMINOPHEN 650 MG RE SUPP: 650.0000 mg | Freq: Four times a day (QID) | RECTAL | Status: DC | PRN

## 2023-07-28 MED ORDER — ENSURE ENLIVE PO LIQD
237.0000 mL | Freq: Two times a day (BID) | ORAL | Status: DC
  Administered 2023-07-29 – 2023-08-27 (×40): 237 mL via ORAL

## 2023-07-28 MED ORDER — APIXABAN 5 MG PO TABS
5.0000 mg | ORAL_TABLET | Freq: Two times a day (BID) | ORAL | Status: DC
  Administered 2023-07-28 – 2023-07-30 (×3): 5 mg via ORAL
  Filled 2023-07-28 (×5): qty 1

## 2023-07-28 MED ORDER — ACETAMINOPHEN 325 MG PO TABS
650.0000 mg | ORAL_TABLET | Freq: Four times a day (QID) | ORAL | Status: DC | PRN
  Administered 2023-08-01 – 2023-08-25 (×9): 650 mg via ORAL
  Filled 2023-07-28 (×6): qty 2

## 2023-07-28 NOTE — ED Notes (Signed)
 ED TO INPATIENT HANDOFF REPORT  Name/Age/Gender Anthony A Dossantos Jr. 68 y.o. male  Code Status    Code Status Orders  (From admission, onward)           Start     Ordered   07/28/23 1549  Full code  Continuous       Question:  By:  Answer:  Consent: discussion documented in EHR   07/28/23 1551           Code Status History     Date Active Date Inactive Code Status Order ID Comments User Context   07/08/2023 0336 07/12/2023 1431 Full Code [id number]  Alfornia Madison, MD ED   02/04/2023 0055 02/16/2023 2115 Full Code [id number]  Silvester Ales, MD ED   12/02/2022 0717 12/18/2022 1919 Full Code [id number]  Celinda Alm Lot, MD ED   10/10/2022 0756 10/13/2022 2120 Full Code [id number]  Celinda Alm Lot, MD ED   10/06/2022 0810 10/09/2022 2310 Full Code [id number]  Paola Dreama SAILOR, MD ED   09/21/2022 0524 09/22/2022 1716 Full Code [id number]  Lonzell Emeline HERO, DO ED   09/11/2022 1223 09/17/2022 2040 Full Code [id number]  Celinda Alm Lot, MD ED   08/27/2022 0803 09/06/2022 0155 Full Code [id number]  Celinda Alm Lot, MD ED   05/19/2022 0555 06/02/2022 2135 Full Code [id number]  Malvina Ellen, MD ED   05/19/2022 0552 05/19/2022 0555 Full Code [id number]  Malvina Ellen, MD ED   03/23/2022 2213 03/25/2022 2325 Full Code [id number]  Mansy, Madison LABOR, MD Inpatient   03/15/2022 2238 03/19/2022 1920 Full Code [id number]  Ricky Alfrieda DASEN, DO ED   06/18/2021 2127 06/19/2021 1828 Full Code [id number]  Patt Alm Macho, MD ED   08/13/2020 2319 10/09/2020 1607 Full Code [id number]  Lenon Chiquita BROCKS, DO ED   07/22/2020 0841 08/09/2020 1648 Full Code [id number]  Lenon Marien CROME, DO ED   07/21/2020 1909 07/22/2020 0840 Full Code [id number]  Dayna Motto, DO ED   07/18/2018 0954 07/18/2018 1318 Full Code [id number]  Jacquetta Sharlot GRADE, NP ED   05/18/2018 1947 05/19/2018 1532 Full Code [id number]  Freddi Hamilton, MD ED   01/05/2018 0050 01/07/2018 1948 Full Code [id number]  Pearlean Tully BRAVO, MD Inpatient   09/01/2017  2047 09/05/2017 1643 Full Code [id number]  Rosan Deward ORN, NP ED   02/17/2017 0305 02/17/2017 1614 Full Code [id number]  Vicky Lamar RIGGERS ED   11/02/2016 0347 11/07/2016 1938 Full Code [id number]  Georgian Sallyann PARAS, DO ED       Home/SNF/Other Homeless  Chief Complaint Delirium [R41.0]  Level of Care/Admitting Diagnosis ED Disposition     ED Disposition  Admit   Condition  --   Comment  Hospital Area: [facility] [[id number]]  Level of Care: Med-Surg [16]  May place patient in observation at [facility] or [facility] if equivalent level of care is available:: Yes  Covid Evaluation: Asymptomatic - no recent exposure (last 10 days) testing not required  Diagnosis: Delirium [806382]  Admitting Physician: ZELLA KATHA HERO [[id number]]  Attending Physician: ZELLA, MIR HART.GULA [[id number]]          Medical History Past Medical History:  Diagnosis Date   Actinic keratosis 11/27/2016   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  Per his chart from [place] , he fell off two storyhouse roof while cleaning his brother's gutters and sustained compression fracture of L3, L4 and L5 and fracture of right transverse process at L3 and nondisplaced fracture of left glenoid rim many years ago. He had multiple imaging in [place]  including Lumbar MRI in 2016 which showed moderate to severe spinal canal stenos   Delirium tremens (HCC) 09/01/2017   Depression    Dry eye 11/23/2016   Foreign body in middle portion of esophagus 05/19/2022   Hiatal hernia 09/14/2016   Status Collis gastroplasty and Nissen's fundoplication on 04/29/2015 in [place]    History  of delirium tremons 07/22/2020   DTs during 10/2016 08/2017. And 12/2017 admissions    History of pulmonary embolus (PE) 11/02/2016   Unprovoked 11/01/2016. Xarelto  from 11/01/16 to 09/08/2017.   Hydrocele    Surgically corrected   Hypertension    Hypoalbuminemia due to protein-calorie malnutrition (HCC) 07/22/2020   Lumbar compression fracture (HCC)    Multiple rib fractures 07/22/2019   Left rib details XR: left ribs demonstrate multiple remote rib   Pancytopenia (HCC)    Rhabdomyolysis 07/22/2020   Skin cancer    exciced 2017   Tinea versicolor 06/08/2013   Tobacco use disorder 07/22/2020   Tooth infection 04/25/2018   Trigger finger, acquired 11/18/2012   Ulnar tunnel syndrome of right wrist 11/08/2012    Allergies Allergies  Allergen Reactions   Other Itching and Other (See Comments)    Seasonal allergies- Itchy eyes, runny nose, congestion   Poison Ivy Extract Rash    IV Location/Drains/Wounds Patient Lines/Drains/Airways Status     Active Line/Drains/Airways     None            Labs/Imaging Results for orders placed or performed during the hospital encounter of 07/27/23 (from the past 48 hours)  CBC     Status: Abnormal   Collection Time: 07/27/23  3:41 PM  Result Value Ref Range   WBC 3.1 (L) 4.0 - 10.5 K/uL   RBC 2.79 (L) 4.22 - 5.81 MIL/uL   Hemoglobin 9.1 (L) 13.0 - 17.0 g/dL   HCT 70.5 (L) 60.9 - 47.9 %   MCV 105.4 (H) 80.0 - 100.0 fL   MCH 32.6 26.0 - 34.0 pg   MCHC 31.0 30.0 - 36.0 g/dL   RDW 79.9 (H) 88.4 - 84.4 %   Platelets 217 150 - 400 K/uL   nRBC 0.0 0.0 - 0.2 %    Comment: Performed at [facility], [address], [place], [place] [address]  Comprehensive metabolic panel     Status: Abnormal   Collection Time: 07/27/23  3:41 PM  Result Value Ref Range   Sodium 141 135 - 145 mmol/L   Potassium 3.7 3.5 - 5.1 mmol/L   Chloride 109 98 - 111 mmol/L   CO2 22 22 - 32 mmol/L   Glucose, Bld 79 70 - 99 mg/dL    Comment:  Glucose reference range applies only to samples taken after fasting for at least 8 hours.   BUN 16 8 - 23 mg/dL   Creatinine, Ser 9.44 (L) 0.61 - 1.24 mg/dL   Calcium  8.9 8.9 - 10.3 mg/dL   Total Protein 7.2 6.5 - 8.1 g/dL   Albumin  3.6 3.5 - 5.0 g/dL   AST 26 15 - 41 U/L   ALT 17 0 - 44 U/L   Alkaline Phosphatase 86 38 - 126  U/L   Total Bilirubin 0.8 0.0 - 1.2 mg/dL   GFR, Estimated >39 >39 mL/min    Comment: (NOTE) Calculated using the CKD-EPI Creatinine Equation (2021)    Anion gap 10 5 - 15    Comment: Performed at [facility], [address], [place], [place] [address]  Ethanol     Status: None   Collection Time: 07/27/23  3:41 PM  Result Value Ref Range   Alcohol , Ethyl (B) <10 <10 mg/dL    Comment: (NOTE) Lowest detectable limit for serum alcohol  is 10 mg/dL.  For medical purposes only. Performed at [facility], [address] [place], [place] [address]   CBG monitoring, ED     Status: None   Collection Time: 07/27/23  3:56 PM  Result Value Ref Range   Glucose-Capillary 74 70 - 99 mg/dL    Comment: Glucose reference range applies only to samples taken after fasting for at least 8 hours.   DG Chest Port 1 View Result Date: 07/27/2023 CLINICAL DATA:  Altered mental status EXAM: PORTABLE CHEST 1 VIEW COMPARISON:  07/12/2023 FINDINGS: No acute airspace disease or pleural effusion. Mild cardiomegaly without overt edema. No pneumothorax IMPRESSION: No active disease. Mild cardiomegaly. Electronically Signed   By: Luke Bun M.D.   On: 07/27/2023 15:28   CT Head Wo Contrast Result Date: 07/27/2023 CLINICAL DATA:  Mental status change, unknown cause. Hypothermia and confusion. EXAM: CT HEAD WITHOUT CONTRAST TECHNIQUE: Contiguous axial images were obtained from the base of the skull through the vertex without intravenous contrast. RADIATION DOSE REDUCTION: This exam was performed according to the departmental dose-optimization program which  includes automated exposure control, adjustment of the mA and/or kV according to patient size and/or use of iterative reconstruction technique. COMPARISON:  Head CT 07/15/2023 FINDINGS: Brain: There is no evidence of an acute infarct, intracranial hemorrhage, mass, midline shift, or extra-axial fluid collection. Mild cerebral and cerebellar atrophy is unchanged. Vascular: Calcified atherosclerosis at the skull base. No hyperdense vessel. Skull: No acute fracture or suspicious osseous lesion. Sinuses/Orbits: Visualized paranasal sinuses and mastoid air cells are clear. Bilateral cataract extraction. Other: None. IMPRESSION: No evidence of acute intracranial abnormality. Electronically Signed   By: Dasie Hamburg M.D.   On: 07/27/2023 15:14    Pending Labs Unresulted Labs (From admission, onward)    None       Vitals/Pain Today's Vitals   07/28/23 0400 07/28/23 0401 07/28/23 0652 07/28/23 1029  BP:   95/70 (!) 111/92  Pulse:   98 88  Resp:   16 15  Temp:   97.9 F (36.6 C) 98.4 F (36.9 C)  TempSrc:    Oral  SpO2: 95%  100% 100%  PainSc:  0-No pain      Isolation Precautions No active isolations  Medications Medications  apixaban  (ELIQUIS ) tablet 5 mg (has no administration in time range)  acetaminophen  (TYLENOL ) tablet 650 mg (has no administration in time range)    Or  acetaminophen  (TYLENOL ) suppository 650 mg (has no administration in time range)    Mobility walks with person assist

## 2023-07-28 NOTE — ED Notes (Addendum)
 Pt. Wandering the lobby, coming up to desk talking nonsense. Pt. Trying to take off jumpsuit. Pt is made aware he is not wearing any pants. Pt continues to take jumpsuit off. This tech and NT Marcelino Duster change him into pants, pt is now resting comfortably.

## 2023-07-28 NOTE — Plan of Care (Signed)
 Contacted by Dr. Charlyn regarding possible admission for this gentleman.    Apparently has a history of alcohol  abuse, hypertension, CVA, chronic heart failure, atrial fibrillation on anticoagulation and essentially has had multiple presentations to the ER over the last few days due to homelessness.  Apparently the patient is oriented, was removed from shelter overnight due to not sleeping and bothering other shelter residents.  Dr. Charlyn states that the while the patient seems to be intermittently confused, there is no acute medical issue requiring hospitalization at this time.  TOC team is apparently trying to assist with placement, but there is not much they can do.  I agree that at this time there does not appear to be any medical indication for hospital admission, and doing so is unlikely to improve the patient's overall care.  I suggested reorientation, and possible initiation of antipsychotic/mood stabilizer if possible, with the goal of controlling the patient's behavior which would allow him to be discharged to a shelter.

## 2023-07-28 NOTE — ED Notes (Addendum)
 Pt walking around lobby trying to take his jump suit off. Pt not making any sense. Pt was brought out of the lobby and given blue scrub pants. Pt had dried poop in the jump suit. Pt is in recliner and asleep at this time.

## 2023-07-28 NOTE — H&P (Signed)
 History and Physical  Anthony A Rusher Jr. FMW:[id number] DOB: [birth year] DOA: 07/28/2023  PCP: Pcp, No   Chief Complaint: Confusion  HPI: Anthony A Mikah Poss. is a 68 y.o. male with medical history significant for alcohol  abuse and prior DTs, history of multiple pulmonary emboli and documented homelessness as well as medication noncompliance being admitted to the hospital due to delirium.  Unfortunately, patient has had multiple recent ER visits since the end of December complaining of being cold, due to erratic behavior, and then leaving AMA.  Most recently, he presented to the emergency department on 1/7, left AMA and then came back.  Yesterday it seems he went to a shelter, where he was not sleeping and was walking around making noise and was kicked out of the shelter, EMS was called and he was brought back to the ER.  Initially documented the patient was rambling, would not answer simple questions, later this morning he was walking around in the hallway of the ER without pants on.  Patient noted to be very unsteady on his feet, have dried stool in his pants, etc.  Initially patient was described as being quite disoriented, hospitalist was contacted to consider admission.  At this point, several more hours of past, the patient is a Anthony [name] bit more verbal, seems oriented and cooperative.  Has been napping.  However, he seems incredibly weak, unsteady, and clearly unable to care for himself.  Review of Systems: Please see HPI for pertinent positives and negatives. A complete 10 system review of systems are otherwise negative.  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  Per his chart from [place] , he fell off two storyhouse roof while cleaning his brother's gutters and sustained compression fracture of L3, L4 and L5 and fracture of right transverse process at L3 and nondisplaced fracture of left glenoid rim many years ago. 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 And 12/2017 admissions    History of pulmonary embolus (PE) 11/02/2016   Unprovoked 11/01/2016. Xarelto  from 11/01/16 to 09/08/2017.   Hydrocele    Surgically corrected   Hypertension    Hypoalbuminemia due to protein-calorie malnutrition (HCC) 07/22/2020   Lumbar compression fracture (HCC)    Multiple rib fractures 07/22/2019   Left rib details XR: left ribs demonstrate multiple remote rib   Pancytopenia (HCC)    Rhabdomyolysis 07/22/2020   Skin cancer    exciced 2017   Tinea versicolor 06/08/2013   Tobacco use disorder 07/22/2020   Tooth infection 04/25/2018   Trigger finger, acquired 11/18/2012   Ulnar tunnel syndrome of right wrist 11/08/2012   Past Surgical History:  Procedure Laterality Date   BIOPSY  05/21/2022   Procedure: BIOPSY;  Surgeon: Charlanne Groom, MD;  Location: [facility] ENDOSCOPY;  Service: Gastroenterology;;   CATARACT EXTRACTION     ESOPHAGOGASTRODUODENOSCOPY (EGD) WITH PROPOFOL  N/A 05/21/2022   Procedure: ESOPHAGOGASTRODUODENOSCOPY (EGD) WITH PROPOFOL ;  Surgeon: Charlanne Groom, MD;  Location: [facility] ENDOSCOPY;  Service: Gastroenterology;  Laterality: N/A;   FOREIGN BODY REMOVAL N/A 05/21/2022   Procedure: FOREIGN BODY REMOVAL;   Surgeon: Charlanne Groom, MD;  Location: [facility] ENDOSCOPY;  Service: Gastroenterology;  Laterality: N/A;   HIATAL HERNIA REPAIR  2016   HYDROCELE EXCISION / REPAIR  2010   SKIN CANCER EXCISION Left    Excised 2017   Social History:  reports that he has never smoked. His smokeless tobacco use includes snuff. He reports current alcohol  use of about 43.0 standard drinks of alcohol  per week. He reports current drug use. Drug: Marijuana.  Allergies  Allergen Reactions   Other Itching and Other (See Comments)    Seasonal allergies- Itchy eyes, runny nose, congestion   Poison Ivy Extract Rash    Family History  Problem Relation Age of Onset   Heart attack Father        died at 65 years   Dementia Father    Stroke Father    Cancer Sister    Colon cancer Neg Hx    Colon polyps Neg Hx    Esophageal cancer Neg Hx    Stomach cancer Neg Hx    Rectal cancer Neg Hx      Prior to Admission medications   Medication Sig Start Date End Date Taking? Authorizing Provider  acetaminophen  (TYLENOL ) 500 MG tablet Take 1 tablet (500 mg total) by mouth every 8 (eight) hours as needed. Patient taking differently: Take 1,000 mg by mouth every 6 (six) hours as needed (for pain). 10/13/22   Gherghe, Costin M, MD  apixaban  (ELIQUIS ) 5 MG TABS tablet Take 2 tablets daily (10 mg total) twice daily for 4 days then on Dec 27, reduce to 1 tablet (5 mg total) twice daily. 07/12/23   Danford, Lonni SQUIBB, MD  ferrous sulfate  325 (65 FE) MG tablet Take 1 tablet (325 mg total) by mouth daily with breakfast. 07/12/23 08/11/23  Danford, Lonni SQUIBB, MD  folic acid  (FOLVITE ) 1 MG tablet Take 1 tablet (1 mg total) by mouth daily. 07/12/23   Danford, Lonni SQUIBB, MD  magnesium  oxide (MAG-OX) 400 (240 Mg) MG tablet Take 1 tablet (400 mg total) by mouth 2 (two) times daily. 02/16/23   Singh, Prashant K, MD  metoprolol  tartrate (LOPRESSOR ) 25 MG tablet Take 1 tablet (25 mg total) by mouth 2 (two) times daily. 07/12/23 10/10/23   DanfordLonni SQUIBB, MD  mometasone -formoterol  (DULERA ) 200-5 MCG/ACT AERO Inhale 2 puffs into the lungs 2 (two) times daily. 02/16/23   Singh, Prashant K, MD  pantoprazole  (PROTONIX ) 40 MG tablet Take 1 tablet (40 mg total) by mouth daily. 02/17/23   Singh, Prashant K, MD  rosuvastatin  (CRESTOR ) 10 MG tablet Take 1 tablet (10 mg total) by mouth daily. 07/12/23 10/10/23  DanfordLonni SQUIBB, MD  thiamine  (VITAMIN B1) 100 MG tablet Take 1 tablet (100 mg total) by mouth daily. 07/12/23   Jonel Lonni SQUIBB, MD    Physical Exam: BP (!) 111/92 (BP Location: Left Arm)   Pulse 88   Temp 98.4 F (36.9 C) (Oral)   Resp 15   SpO2 100%  General: Sleeping in a recliner in the hallway.  Easily arousable, able to tell me his name, where he is.  Quite cooperative with answering questions, states he is hungry and  would like to eat something. Eyes: EOMI, clear conjuctivae, white sclerea Cardiovascular: RRR, no murmurs or rubs, he has some trace peripheral edema  Respiratory: clear to auscultation bilaterally, no wheezes, no crackles  Abdomen: soft, nontender Skin: dry, no rashes  Musculoskeletal: no joint effusions, normal range of motion  Psychiatric: appropriate affect, normal speech  Neurologic: extraocular muscles intact, clear speech, moving all extremities with intact sensorium         Labs on Admission:  Basic Metabolic Panel: Recent Labs  Lab 07/27/23 1541  NA 141  K 3.7  CL 109  CO2 22  GLUCOSE 79  BUN 16  CREATININE 0.55*  CALCIUM  8.9   Liver Function Tests: Recent Labs  Lab 07/27/23 1541  AST 26  ALT 17  ALKPHOS 86  BILITOT 0.8  PROT 7.2  ALBUMIN  3.6   No results for input(s): LIPASE, AMYLASE in the last 168 hours. No results for input(s): AMMONIA in the last 168 hours. CBC: Recent Labs  Lab 07/27/23 1541  WBC 3.1*  HGB 9.1*  HCT 29.4*  MCV 105.4*  PLT 217   Cardiac Enzymes: No results for input(s): CKTOTAL, CKMB, CKMBINDEX, TROPONINI in  the last 168 hours. BNP (last 3 results) Recent Labs    03/06/23 0529 07/07/23 1955 07/12/23 1801  BNP 773.6* 553.5* 754.3*    ProBNP (last 3 results) No results for input(s): PROBNP in the last 8760 hours.  CBG: Recent Labs  Lab 07/27/23 1556  GLUCAP 74    Radiological Exams on Admission: DG Chest Port 1 View Result Date: 07/27/2023 CLINICAL DATA:  Altered mental status EXAM: PORTABLE CHEST 1 VIEW COMPARISON:  07/12/2023 FINDINGS: No acute airspace disease or pleural effusion. Mild cardiomegaly without overt edema. No pneumothorax IMPRESSION: No active disease. Mild cardiomegaly. Electronically Signed   By: Luke Bun M.D.   On: 07/27/2023 15:28   CT Head Wo Contrast Result Date: 07/27/2023 CLINICAL DATA:  Mental status change, unknown cause. Hypothermia and confusion. EXAM: CT HEAD WITHOUT CONTRAST TECHNIQUE: Contiguous axial images were obtained from the base of the skull through the vertex without intravenous contrast. RADIATION DOSE REDUCTION: This exam was performed according to the departmental dose-optimization program which includes automated exposure control, adjustment of the mA and/or kV according to patient size and/or use of iterative reconstruction technique. COMPARISON:  Head CT 07/15/2023 FINDINGS: Brain: There is no evidence of an acute infarct, intracranial hemorrhage, mass, midline shift, or extra-axial fluid collection. Mild cerebral and cerebellar atrophy is unchanged. Vascular: Calcified atherosclerosis at the skull base. No hyperdense vessel. Skull: No acute fracture or suspicious osseous lesion. Sinuses/Orbits: Visualized paranasal sinuses and mastoid air cells are clear. Bilateral cataract extraction. Other: None. IMPRESSION: No evidence of acute intracranial abnormality. Electronically Signed   By: Dasie Hamburg M.D.   On: 07/27/2023 15:14   Assessment/Plan Anthony [name]. is a 68 y.o. male with medical history significant for alcohol  abuse and prior DTs,  history of multiple pulmonary emboli and documented homelessness as well as medication noncompliance being admitted to the hospital due to delirium.  Unfortunately a difficult situation, as he has a long history of noncompliance, alcohol  abuse, and likely undiagnosed psychiatric maladies as well as developing dementia.  No evidence of acute stroke, infection, etc.  After resting for a few hours it seems like he is doing a Anthony [name] bit better, so may have been suffering from some insomnia induced delirium.  Given the fact that he is weak, not eating, unable to care for himself currently,  he will be observed in the hospital for the time being.  If remains stable through the night, he can likely be safely discharged to a shelter tomorrow.  Currently, he would likely not be able to go back to shelter today due to his recent behavioral issues.  In the meantime, will order resumption of his oral Eliquis , though he clearly stated during his last admission that he is not going to take any prescribed medication.  Delirium-as stated above, likely in the setting of some baseline undiagnosed dementia or psychiatric disorder.  Currently improving after getting several hours of sleep.  No indication for scheduled medications, no evidence of alcohol  withdrawal or other acute issue. -Observation admission -Continue supportive care, and redirect as indicated  History of multiple PE and VTE-continue Eliquis   Weakness-PT/OT consult, previously no acute therapy was indicated  Of note, ER provider Dr. Levander did speak to the patient's brother over the phone yesterday who stated the patient is often like this.     Code Status: Full Code  Admission status: Observation  Time spent: 49 minutes  Shabrea Weldin CHRISTELLA Gail MD [facility] Pager [phone]  If 7PM-7AM, please contact night-coverage [email or web address] Password Anthony [name]  07/28/2023, 3:51 PM

## 2023-07-28 NOTE — ED Provider Notes (Addendum)
 [place] EMERGENCY DEPARTMENT AT [facility] Provider Note   CSN: [id number] Arrival date & time: 07/28/23  0340     History  Chief Complaint  Patient presents with   Homeless    Anthony [name]. is a 68 y.o. male.  68 year old male with a history of alcohol  abuse, hypertension, homelessness, CVA, chronic heart failure, atrial fibrillation, history of PE in July 2024 comes in with chief complaint of homelessness.  Patient was seen in the ER multiple times yesterday.  It appears that he was sent to the [facility], likely moved to the [facility] and they had him leave because he was loud.  Patient has no complaints from his side. Upon further questioning, he said he has pain all over.  He confirms he is homeless.  He is oriented to self, location but thinks it is 52.  He states that normally he will just sleep wherever he can find a place to rest, but yesterday was white flag day.  He denies any substance use.       Home Medications Prior to Admission medications   Medication Sig Start Date End Date Taking? Authorizing Provider  acetaminophen  (TYLENOL ) 500 MG tablet Take 1 tablet (500 mg total) by mouth every 8 (eight) hours as needed. Patient taking differently: Take 1,000 mg by mouth every 6 (six) hours as needed (for pain). 10/13/22   Gherghe, Costin M, MD  apixaban  (ELIQUIS ) 5 MG TABS tablet Take 2 tablets daily (10 mg total) twice daily for 4 days then on Dec 27, reduce to 1 tablet (5 mg total) twice daily. 07/12/23   Danford, Lonni SQUIBB, MD  ferrous sulfate  325 (65 FE) MG tablet Take 1 tablet (325 mg total) by mouth daily with breakfast. 07/12/23 08/11/23  Danford, Lonni SQUIBB, MD  folic acid  (FOLVITE ) 1 MG tablet Take 1 tablet (1 mg total) by mouth daily. 07/12/23   Danford, Lonni SQUIBB, MD  magnesium  oxide (MAG-OX) 400 (240 Mg) MG tablet Take 1 tablet (400 mg total) by mouth 2 (two) times daily. 02/16/23   Singh, Prashant K, MD  metoprolol  tartrate  (LOPRESSOR ) 25 MG tablet Take 1 tablet (25 mg total) by mouth 2 (two) times daily. 07/12/23 10/10/23  DanfordLonni SQUIBB, MD  mometasone -formoterol  (DULERA ) 200-5 MCG/ACT AERO Inhale 2 puffs into the lungs 2 (two) times daily. 02/16/23   Singh, Prashant K, MD  pantoprazole  (PROTONIX ) 40 MG tablet Take 1 tablet (40 mg total) by mouth daily. 02/17/23   Singh, Prashant K, MD  rosuvastatin  (CRESTOR ) 10 MG tablet Take 1 tablet (10 mg total) by mouth daily. 07/12/23 10/10/23  DanfordLonni SQUIBB, MD  thiamine  (VITAMIN B1) 100 MG tablet Take 1 tablet (100 mg total) by mouth daily. 07/12/23   Danford, Lonni SQUIBB, MD      Allergies    Other and Poison ivy extract    Review of Systems   Review of Systems  All other systems reviewed and are negative.   Physical Exam Updated Vital Signs BP (!) 111/92 (BP Location: Left Arm)   Pulse 88   Temp 98.4 F (36.9 C) (Oral)   Resp 15   SpO2 100%  Physical Exam Vitals and nursing note reviewed.  Constitutional:      Appearance: He is well-developed.  HENT:     Head: Atraumatic.  Cardiovascular:     Rate and Rhythm: Normal rate.  Pulmonary:     Effort: Pulmonary effort is normal.  Musculoskeletal:     Cervical  back: Neck supple.  Skin:    General: Skin is warm.  Neurological:     Mental Status: He is alert. Mental status is at baseline.     ED Results / Procedures / Treatments   Labs (all labs ordered are listed, but only abnormal results are displayed) Labs Reviewed - No data to display  EKG None  Radiology DG Chest [facility] 1 View Result Date: 07/27/2023 CLINICAL DATA:  Altered mental status EXAM: PORTABLE CHEST 1 VIEW COMPARISON:  07/12/2023 FINDINGS: No acute airspace disease or pleural effusion. Mild cardiomegaly without overt edema. No pneumothorax IMPRESSION: No active disease. Mild cardiomegaly. Electronically Signed   By: Luke Bun M.D.   On: 07/27/2023 15:28   CT Head Wo Contrast Result Date: 07/27/2023 CLINICAL DATA:   Mental status change, unknown cause. Hypothermia and confusion. EXAM: CT HEAD WITHOUT CONTRAST TECHNIQUE: Contiguous axial images were obtained from the base of the skull through the vertex without intravenous contrast. RADIATION DOSE REDUCTION: This exam was performed according to the departmental dose-optimization program which includes automated exposure control, adjustment of the mA and/or kV according to patient size and/or use of iterative reconstruction technique. COMPARISON:  Head CT 07/15/2023 FINDINGS: Brain: There is no evidence of an acute infarct, intracranial hemorrhage, mass, midline shift, or extra-axial fluid collection. Mild cerebral and cerebellar atrophy is unchanged. Vascular: Calcified atherosclerosis at the skull base. No hyperdense vessel. Skull: No acute fracture or suspicious osseous lesion. Sinuses/Orbits: Visualized paranasal sinuses and mastoid air cells are clear. Bilateral cataract extraction. Other: None. IMPRESSION: No evidence of acute intracranial abnormality. Electronically Signed   By: Dasie Hamburg M.D.   On: 07/27/2023 15:14    Procedures Procedures    Medications Ordered in ED Medications - No data to display  ED Course/ Medical Decision Making/ A&P                                 Medical Decision Making Risk Decision regarding hospitalization.   68 year old male with history of PE-A-fib on anticoagulation, alcohol  use disorder, likely some cognitive issues comes in with chief complaint of homelessness.  Effectively patient was dismissed by the [facility] because of behavioral changes.   Pt is oriented to self and location. Has some confusion. Asking for food, pacing around, but directable. States homeless. Looks like we called family y'day and they too indicated that pt confused at baseline.  I called patient's brother Anthony [name] today and spoke with him.  He tells me that patient has been homeless for several years now.  Patient gets [facility] and is  able to manage.  He is however very forgetful and loses phones all the time and prescriptions all the time.  He cannot take patient back because of his alcoholism history.  They live in [place] .  He had last spoken to the patient few days back from [facility].  From his perspective, he recommends that patient get a bus pass and if needed prescription.  He states that patient often is more confused when he has not slept well or is hungry.   We did consult social work team.  They have contacted [facility] and patient will be taken by [facility].  Will try to give him a cab voucher.   12:21 PM TOC team consulted.  They recommend that patient be placed back in shelter.  I have reassessed the patient multiple times now.  He is laying, blanket over his head, speaking  to somebody.  Appears to be delirious right now.  He is not safe for discharge at this time, especially in this environment.  We will proceed with admission request.  Final Clinical Impression(s) / ED Diagnoses Final diagnoses:  Homelessness  Delirious    Rx / DC Orders ED Discharge Orders     None         Charlyn Sora, MD 07/28/23 1112    Charlyn Sora, MD 07/28/23 1220    Charlyn Sora, MD 07/28/23 1221    Charlyn Sora, MD 07/28/23 1230

## 2023-07-28 NOTE — Progress Notes (Addendum)
 CSW has attempted to outreach to Director of [employer] to inquire about pt returning. Left HIPAA Compliant voicemail.   Addend @ 10:42 AM CSW spoke with Maggie at [facility] who reported pt does not have a bed but was present for [employer] last evening that began at 8pm. Pt is not a candidate for SNF. CSW notified EDP and paramedic via secure chat. CSW will approve a taxi voucher to [facility].

## 2023-07-28 NOTE — ED Triage Notes (Signed)
 Pt BIB [facility] from [facility] after they kicked him out because he was being loud and not allowing others there to sleep. Pt is rambling and will answer very simple questions. Pt seen for same yesterday and left AMA.

## 2023-07-28 NOTE — ED Provider Triage Note (Signed)
 Emergency Medicine Provider Triage Evaluation Note  Anthony A Mcchesney Jr. , a 68 y.o. male  was evaluated in triage.  Pt complains of homelessness.   Derwood Kaplan, MD 07/28/23 1015

## 2023-07-29 DIAGNOSIS — F0393 Unspecified dementia, unspecified severity, with mood disturbance: Secondary | ICD-10-CM | POA: Diagnosis present

## 2023-07-29 DIAGNOSIS — Z5901 Sheltered homelessness: Secondary | ICD-10-CM | POA: Diagnosis not present

## 2023-07-29 DIAGNOSIS — F17290 Nicotine dependence, other tobacco product, uncomplicated: Secondary | ICD-10-CM | POA: Diagnosis present

## 2023-07-29 DIAGNOSIS — E785 Hyperlipidemia, unspecified: Secondary | ICD-10-CM | POA: Diagnosis present

## 2023-07-29 DIAGNOSIS — I4819 Other persistent atrial fibrillation: Secondary | ICD-10-CM | POA: Diagnosis present

## 2023-07-29 DIAGNOSIS — J9811 Atelectasis: Secondary | ICD-10-CM | POA: Diagnosis not present

## 2023-07-29 DIAGNOSIS — E876 Hypokalemia: Secondary | ICD-10-CM | POA: Diagnosis present

## 2023-07-29 DIAGNOSIS — F05 Delirium due to known physiological condition: Secondary | ICD-10-CM | POA: Diagnosis present

## 2023-07-29 DIAGNOSIS — F10131 Alcohol abuse with withdrawal delirium: Secondary | ICD-10-CM | POA: Diagnosis not present

## 2023-07-29 DIAGNOSIS — D509 Iron deficiency anemia, unspecified: Secondary | ICD-10-CM | POA: Diagnosis present

## 2023-07-29 DIAGNOSIS — R410 Disorientation, unspecified: Secondary | ICD-10-CM | POA: Diagnosis present

## 2023-07-29 DIAGNOSIS — I4891 Unspecified atrial fibrillation: Secondary | ICD-10-CM | POA: Diagnosis not present

## 2023-07-29 DIAGNOSIS — F0394 Unspecified dementia, unspecified severity, with anxiety: Secondary | ICD-10-CM | POA: Diagnosis present

## 2023-07-29 DIAGNOSIS — F1024 Alcohol dependence with alcohol-induced mood disorder: Secondary | ICD-10-CM | POA: Diagnosis present

## 2023-07-29 DIAGNOSIS — R451 Restlessness and agitation: Secondary | ICD-10-CM | POA: Diagnosis not present

## 2023-07-29 DIAGNOSIS — F10231 Alcohol dependence with withdrawal delirium: Secondary | ICD-10-CM | POA: Diagnosis present

## 2023-07-29 DIAGNOSIS — F03918 Unspecified dementia, unspecified severity, with other behavioral disturbance: Secondary | ICD-10-CM | POA: Diagnosis present

## 2023-07-29 DIAGNOSIS — F1014 Alcohol abuse with alcohol-induced mood disorder: Secondary | ICD-10-CM | POA: Diagnosis not present

## 2023-07-29 DIAGNOSIS — I480 Paroxysmal atrial fibrillation: Secondary | ICD-10-CM | POA: Diagnosis not present

## 2023-07-29 DIAGNOSIS — E871 Hypo-osmolality and hyponatremia: Secondary | ICD-10-CM | POA: Diagnosis present

## 2023-07-29 DIAGNOSIS — F329 Major depressive disorder, single episode, unspecified: Secondary | ICD-10-CM | POA: Diagnosis present

## 2023-07-29 DIAGNOSIS — I2699 Other pulmonary embolism without acute cor pulmonale: Secondary | ICD-10-CM | POA: Diagnosis present

## 2023-07-29 DIAGNOSIS — E8809 Other disorders of plasma-protein metabolism, not elsewhere classified: Secondary | ICD-10-CM | POA: Diagnosis present

## 2023-07-29 DIAGNOSIS — I700 Atherosclerosis of aorta: Secondary | ICD-10-CM | POA: Diagnosis present

## 2023-07-29 DIAGNOSIS — I5022 Chronic systolic (congestive) heart failure: Secondary | ICD-10-CM | POA: Diagnosis present

## 2023-07-29 DIAGNOSIS — F10931 Alcohol use, unspecified with withdrawal delirium: Secondary | ICD-10-CM | POA: Diagnosis not present

## 2023-07-29 DIAGNOSIS — I110 Hypertensive heart disease with heart failure: Secondary | ICD-10-CM | POA: Diagnosis present

## 2023-07-29 DIAGNOSIS — R569 Unspecified convulsions: Secondary | ICD-10-CM | POA: Diagnosis not present

## 2023-07-29 DIAGNOSIS — G4700 Insomnia, unspecified: Secondary | ICD-10-CM | POA: Diagnosis present

## 2023-07-29 DIAGNOSIS — E872 Acidosis, unspecified: Secondary | ICD-10-CM | POA: Diagnosis present

## 2023-07-29 DIAGNOSIS — G9341 Metabolic encephalopathy: Secondary | ICD-10-CM | POA: Diagnosis present

## 2023-07-29 LAB — TSH: TSH: 2.967 u[IU]/mL (ref 0.350–4.500)

## 2023-07-29 LAB — VITAMIN B12: Vitamin B-12: 303 pg/mL (ref 180–914)

## 2023-07-29 LAB — AMMONIA: Ammonia: 12 umol/L (ref 9–35)

## 2023-07-29 MED ORDER — HALOPERIDOL LACTATE 5 MG/ML IJ SOLN
2.0000 mg | Freq: Once | INTRAMUSCULAR | Status: AC
  Administered 2023-07-29: 2 mg via INTRAMUSCULAR
  Filled 2023-07-29: qty 1

## 2023-07-29 MED ORDER — IPRATROPIUM-ALBUTEROL 0.5-2.5 (3) MG/3ML IN SOLN: 3.0000 mL | RESPIRATORY_TRACT | Status: DC | PRN

## 2023-07-29 MED ORDER — OLANZAPINE 10 MG IM SOLR
5.0000 mg | Freq: Four times a day (QID) | INTRAMUSCULAR | Status: DC | PRN
  Administered 2023-07-29 – 2023-08-03 (×7): 5 mg via INTRAMUSCULAR
  Filled 2023-07-29 (×12): qty 10

## 2023-07-29 MED ORDER — HALOPERIDOL LACTATE 5 MG/ML IJ SOLN: 2.0000 mg | Freq: Once | INTRAMUSCULAR | Status: DC

## 2023-07-29 MED ORDER — METOPROLOL TARTRATE 5 MG/5ML IV SOLN
5.0000 mg | INTRAVENOUS | Status: DC | PRN
Start: 2023-07-29 — End: 2023-08-27
  Administered 2023-07-29 – 2023-08-09 (×7): 5 mg via INTRAVENOUS
  Filled 2023-07-29 (×7): qty 5

## 2023-07-29 MED ORDER — LORAZEPAM 2 MG/ML IJ SOLN
2.0000 mg | INTRAMUSCULAR | Status: DC | PRN
  Administered 2023-07-29 – 2023-07-31 (×4): 2 mg via INTRAVENOUS
  Filled 2023-07-29 (×4): qty 1

## 2023-07-29 MED ORDER — HYDRALAZINE HCL 20 MG/ML IJ SOLN
10.0000 mg | INTRAMUSCULAR | Status: DC | PRN
Start: 2023-07-29 — End: 2023-08-27

## 2023-07-29 MED ORDER — THIAMINE HCL 100 MG/ML IJ SOLN
500.0000 mg | INTRAMUSCULAR | Status: DC
  Administered 2023-07-29 – 2023-07-30 (×2): 500 mg via INTRAVENOUS
  Filled 2023-07-29 (×2): qty 5

## 2023-07-29 MED ORDER — ONDANSETRON HCL 4 MG/2ML IJ SOLN: 4.0000 mg | Freq: Four times a day (QID) | INTRAMUSCULAR | Status: DC | PRN

## 2023-07-29 NOTE — Progress Notes (Signed)
   07/29/23 1927  Assess: MEWS Score  Temp 99.2 F (37.3 C)  BP (!) 122/90  MAP (mmHg) 101  Pulse Rate (!) 133  Resp 20  Level of Consciousness Alert  SpO2 100 %  O2 Device Room Air  Assess: MEWS Score  MEWS Temp 0  MEWS Systolic 0  MEWS Pulse 3  MEWS RR 0  MEWS LOC 0  MEWS Score 3  MEWS Score Color Yellow  Assess: if the MEWS score is Yellow or Red  Were vital signs accurate and taken at a resting state? Yes  Does the patient meet 2 or more of the SIRS criteria? No  MEWS guidelines implemented  Yes, yellow  Treat  MEWS Interventions Considered administering scheduled or prn medications/treatments as ordered  Take Vital Signs  Increase Vital Sign Frequency  Yellow: Q2hr x1, continue Q4hrs until patient remains green for 12hrs  Escalate  MEWS: Escalate Yellow: Discuss with charge nurse and consider notifying provider and/or RRT  Notify: Charge Nurse/RN  Name of Charge Nurse/RN Notified April Ivy, Charge RN  Provider Notification  Provider Name/Title A.Andrez, NP  Date Provider Notified 07/29/23  Time Provider Notified 1931  Method of Notification Page (secure chat)  Notification Reason Other (Comment) (pt YELLOW MEWS and needs sitter order renewed. HR in 130's.)  Provider response See new orders (sitter order and per NP to give prn IV lopressor .)  Date of Provider Response 07/29/23  Time of Provider Response 1936  Assess: SIRS CRITERIA  SIRS Temperature  0  SIRS Respirations  0  SIRS Pulse 1  SIRS WBC 0  SIRS Score Sum  1   Sitter at bedside and soft wrist restraints in place. Pt is awake and calm,  A&O to self. HR in 120's-130's.  Lavanda Andrez, NP notified. Charge RN- April Townes notified. Continuous pulse ox initiated and Per NP- IV lopressor  5mg  given by charge RN per order- see mar. Will reassess in 30 minutes per order. AC- Misha notified and updated pt condition and at  bedside.
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
## 2023-07-29 NOTE — Hospital Course (Addendum)
 68 year old man presenting with delirium.  Previously hospitalized in December for PE.  Multiple presentations to the ED since.  History of noncompliance, alcohol  abuse, consideration given to psychiatric maladies, developing dementia.  Admitted for inability to care for self.  Developed atrial fibrillation with rapid ventricular response and was seen by cardiology.  Seen multiple times by psychiatry for delirium.  Hospitalization prolonged by persistent encephalopathy and concern for safe discharge.  Condition gradually improved.  Seen again by psychiatry, felt to have capacity to make many decisions.  At this point CSW pursuing boarding home.  Likely home to the shelter in the near future.  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  Not felt to need inpatient psychiatric treatment. 1/13 psychiatry consult for delirium. 1/14 cardiology consultation for atrial fibrillation with rapid ventricular response.

## 2023-07-29 NOTE — Plan of Care (Signed)

## 2023-07-29 NOTE — Progress Notes (Signed)
 PROGRESS NOTE    Anthony A Altier Jr.  FMW:[id number] DOB: [birth year] DOA: 07/28/2023 PCP: Pcp, No    Brief Narrative:  68 year old with history of severe alcohol  use with frequent withdrawal and delirium in the past, paroxysmal A-fib not on anticoagulation, HLD, HTN, recurrent PE, hiatal hernia, homeless iron  deficiency comes to the hospital from a shelter due to delirium.   Assessment & Plan:  Principal Problem:   Delirium   Delirium History of alcohol  abuse - I believe this delirium and encephalopathy is related to his dementia from alcohol  use versus alcohol  withdrawal.  Currently on as needed Zyprexa  and Ativan .  Ammonia and B12 are normal, TSH is pending.  For now placed him on high-dose thiamine .  CT of the head is negative.  Currently in restraints as he poses danger to self and others. Have consulted psychiatry as he may require chronic medication  Recurrent pulmonary embolism -Continue Eliquis   Essential hypertension -IV as needed  Paroxysmal atrial fibrillation -Metoprolol  twice daily.  IV as needed.  Already on Eliquis   Hyperlipidemia -Crestor   Essential hypertension -Metoprolol .  IV as needed   DVT prophylaxis: Eliquis     Code Status: Full Code Family Communication:   Inpatient, ongoing treatment for his delirium    Subjective: Patient was quite combative and noncompliant this morning requiring restraints.  Thereafter Zyprexa  was administered which helped him calm down.  During my visit he was drowsy   Examination:  General exam: Appears calm and comfortable  Respiratory system: Clear to auscultation. Respiratory effort normal. Cardiovascular system: S1 & S2 heard, RRR. No JVD, murmurs, rubs, gallops or clicks. No pedal edema. Gastrointestinal system: Abdomen is nondistended, soft and nontender. No organomegaly or masses felt. Normal bowel sounds heard. Central nervous system: Drowsy but grossly moves all the extremities.  Alert to name and  place Extremities: Symmetric 5 x 5 power. Skin: No rashes, lesions or ulcers Psychiatry: Judgement and insight appear poor                Diet Orders (From admission, onward)     Start     Ordered   07/28/23 1549  Diet regular Room service appropriate? Yes; Fluid consistency: Thin  Diet effective now       Question Answer Comment  Room service appropriate? Yes   Fluid consistency: Thin      07/28/23 1551            Objective: Vitals:   07/29/23 0014 07/29/23 0840 07/29/23 0940 07/29/23 1134  BP: 116/67 134/87 122/81 112/83  Pulse: 98 99 84 100  Resp: 19 20 16 20   Temp: 98.7 F (37.1 C) 97.8 F (36.6 C) 98 F (36.7 C) 97.9 F (36.6 C)  TempSrc:  Axillary Axillary   SpO2: 99%   100%  Weight:      Height:        Intake/Output Summary (Last 24 hours) at 07/29/2023 1151 Last data filed at 07/29/2023 1045 Gross per 24 hour  Intake --  Output 300 ml  Net -300 ml   Filed Weights   07/28/23 2159  Weight: 64.7 kg    Scheduled Meds:  apixaban   5 mg Oral BID   feeding supplement  237 mL Oral BID BM   Continuous Infusions:  thiamine  (VITAMIN B1) injection      Nutritional status     Body mass index is 21.69 kg/m.  Data Reviewed:   CBC: Recent Labs  Lab 07/27/23 1541  WBC 3.1*  HGB 9.1*  HCT  29.4*  MCV 105.4*  PLT 217   Basic Metabolic Panel: Recent Labs  Lab 07/27/23 1541  NA 141  K 3.7  CL 109  CO2 22  GLUCOSE 79  BUN 16  CREATININE 0.55*  CALCIUM  8.9   GFR: Estimated Creatinine Clearance: 82 mL/min (A) (by C-G formula based on SCr of 0.55 mg/dL (L)). Liver Function Tests: Recent Labs  Lab 07/27/23 1541  AST 26  ALT 17  ALKPHOS 86  BILITOT 0.8  PROT 7.2  ALBUMIN  3.6   No results for input(s): LIPASE, AMYLASE in the last 168 hours. Recent Labs  Lab 07/29/23 0859  AMMONIA 12   Coagulation Profile: No results for input(s): INR, PROTIME in the last 168 hours. Cardiac Enzymes: No results for input(s):  CKTOTAL, CKMB, CKMBINDEX, TROPONINI in the last 168 hours. BNP (last 3 results) No results for input(s): PROBNP in the last 8760 hours. HbA1C: No results for input(s): HGBA1C in the last 72 hours. CBG: Recent Labs  Lab 07/27/23 1556  GLUCAP 74   Lipid Profile: No results for input(s): CHOL, HDL, LDLCALC, TRIG, CHOLHDL, LDLDIRECT in the last 72 hours. Thyroid Function Tests: Recent Labs    07/29/23 0859  TSH 2.967   Anemia Panel: Recent Labs    07/29/23 0859  VITAMINB12 303   Sepsis Labs: No results for input(s): PROCALCITON, LATICACIDVEN in the last 168 hours.  No results found for this or any previous visit (from the past 240 hours).       Radiology Studies: DG Chest Port 1 View Result Date: 07/27/2023 CLINICAL DATA:  Altered mental status EXAM: PORTABLE CHEST 1 VIEW COMPARISON:  07/12/2023 FINDINGS: No acute airspace disease or pleural effusion. Mild cardiomegaly without overt edema. No pneumothorax IMPRESSION: No active disease. Mild cardiomegaly. Electronically Signed   By: Luke Bun M.D.   On: 07/27/2023 15:28   CT Head Wo Contrast Result Date: 07/27/2023 CLINICAL DATA:  Mental status change, unknown cause. Hypothermia and confusion. EXAM: CT HEAD WITHOUT CONTRAST TECHNIQUE: Contiguous axial images were obtained from the base of the skull through the vertex without intravenous contrast. RADIATION DOSE REDUCTION: This exam was performed according to the departmental dose-optimization program which includes automated exposure control, adjustment of the mA and/or kV according to patient size and/or use of iterative reconstruction technique. COMPARISON:  Head CT 07/15/2023 FINDINGS: Brain: There is no evidence of an acute infarct, intracranial hemorrhage, mass, midline shift, or extra-axial fluid collection. Mild cerebral and cerebellar atrophy is unchanged. Vascular: Calcified atherosclerosis at the skull base. No hyperdense vessel. Skull: No  acute fracture or suspicious osseous lesion. Sinuses/Orbits: Visualized paranasal sinuses and mastoid air cells are clear. Bilateral cataract extraction. Other: None. IMPRESSION: No evidence of acute intracranial abnormality. Electronically Signed   By: Dasie Hamburg M.D.   On: 07/27/2023 15:14           LOS: 0 days   Time spent= 35 mins    Burgess JAYSON Dare, MD [facility]  If 7PM-7AM, please contact night-coverage  07/29/2023, 11:51 AM

## 2023-07-29 NOTE — Progress Notes (Signed)
   07/29/23 2030  Vitals  Temp 99.1 F (37.3 C)  BP 102/73  MAP (mmHg) 83  BP Location Right Arm  BP Method Automatic  Patient Position (if appropriate) Lying  Pulse Rate (!) 106  Pulse Rate Source Monitor  Resp 19  MEWS COLOR  MEWS Score Color Green  Oxygen Therapy  SpO2 98 %  O2 Device Room Air  MEWS Score  MEWS Temp 0  MEWS Systolic 0  MEWS Pulse 1  MEWS RR 0  MEWS LOC 0  MEWS Score 1    Pt sleeping, calm. Repeat HR 106. Lavanda Horns, NP notified. No new orders at this time. Will continue to monitor.
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
## 2023-07-30 ENCOUNTER — Inpatient Hospital Stay (HOSPITAL_COMMUNITY): Payer: 59

## 2023-07-30 ENCOUNTER — Inpatient Hospital Stay (HOSPITAL_COMMUNITY)
Admit: 2023-07-30 | Discharge: 2023-07-30 | Disposition: A | Discharge status: Home / Self Care | Payer: 59 | Attending: Internal Medicine | Admitting: Internal Medicine

## 2023-07-30 DIAGNOSIS — R451 Restlessness and agitation: Secondary | ICD-10-CM

## 2023-07-30 DIAGNOSIS — R410 Disorientation, unspecified: Secondary | ICD-10-CM | POA: Diagnosis not present

## 2023-07-30 DIAGNOSIS — F1014 Alcohol abuse with alcohol-induced mood disorder: Secondary | ICD-10-CM | POA: Diagnosis not present

## 2023-07-30 DIAGNOSIS — F10931 Alcohol use, unspecified with withdrawal delirium: Secondary | ICD-10-CM

## 2023-07-30 DIAGNOSIS — R509 Fever, unspecified: Secondary | ICD-10-CM

## 2023-07-30 DIAGNOSIS — R569 Unspecified convulsions: Secondary | ICD-10-CM

## 2023-07-30 DIAGNOSIS — I480 Paroxysmal atrial fibrillation: Secondary | ICD-10-CM | POA: Diagnosis not present

## 2023-07-30 LAB — TROPONIN I (HIGH SENSITIVITY)
Troponin I (High Sensitivity): 8 ng/L (ref <18)
Troponin I (High Sensitivity): 8 ng/L (ref <18)

## 2023-07-30 LAB — URINALYSIS, W/ REFLEX TO CULTURE (INFECTION SUSPECTED)
Bilirubin Urine: NEGATIVE
Glucose, UA: NEGATIVE mg/dL
Hgb urine dipstick: NEGATIVE
Ketones, ur: NEGATIVE mg/dL
Leukocytes,Ua: NEGATIVE
Nitrite: NEGATIVE
Protein, ur: NEGATIVE mg/dL
Specific Gravity, Urine: 1.014 (ref 1.005–1.030)
pH: 8 (ref 5.0–8.0)

## 2023-07-30 LAB — CBC
HCT: 31.2 % — ABNORMAL LOW (ref 39.0–52.0)
Hemoglobin: 9.9 g/dL — ABNORMAL LOW (ref 13.0–17.0)
MCH: 32.9 pg (ref 26.0–34.0)
MCHC: 31.7 g/dL (ref 30.0–36.0)
MCV: 103.7 fL — ABNORMAL HIGH (ref 80.0–100.0)
Platelets: 231 10*3/uL (ref 150–400)
RBC: 3.01 MIL/uL — ABNORMAL LOW (ref 4.22–5.81)
RDW: 19.2 % — ABNORMAL HIGH (ref 11.5–15.5)
WBC: 4.5 10*3/uL (ref 4.0–10.5)
nRBC: 0 % (ref 0.0–0.2)

## 2023-07-30 LAB — BASIC METABOLIC PANEL
Anion gap: 9 (ref 5–15)
BUN: 15 mg/dL (ref 8–23)
CO2: 22 mmol/L (ref 22–32)
Calcium: 8.5 mg/dL — ABNORMAL LOW (ref 8.9–10.3)
Chloride: 105 mmol/L (ref 98–111)
Creatinine, Ser: 0.65 mg/dL (ref 0.61–1.24)
GFR, Estimated: >60 mL/min (ref >60)
Glucose, Bld: 84 mg/dL (ref 70–99)
Potassium: 3.7 mmol/L (ref 3.5–5.1)
Sodium: 136 mmol/L (ref 135–145)

## 2023-07-30 LAB — PHOSPHORUS: Phosphorus: 3.7 mg/dL (ref 2.5–4.6)

## 2023-07-30 LAB — MAGNESIUM: Magnesium: 1.4 mg/dL — ABNORMAL LOW (ref 1.7–2.4)

## 2023-07-30 LAB — HIV ANTIBODY (ROUTINE TESTING W REFLEX): HIV Screen 4th Generation wRfx: NONREACTIVE

## 2023-07-30 MED ORDER — VANCOMYCIN HCL 750 MG IV SOLR
750.0000 mg | Freq: Two times a day (BID) | INTRAVENOUS | Status: DC
  Administered 2023-07-31 – 2023-08-03 (×7): 750 mg via INTRAVENOUS
  Filled 2023-07-30: qty 15
  Filled 2023-07-30: qty 750
  Filled 2023-07-30 (×6): qty 15

## 2023-07-30 MED ORDER — DILTIAZEM HCL-DEXTROSE 125-5 MG/125ML-% IV SOLN (PREMIX)
5.0000 mg/h | INTRAVENOUS | Status: DC
  Administered 2023-07-30: 10 mg/h via INTRAVENOUS
  Administered 2023-07-30: 5 mg/h via INTRAVENOUS
  Administered 2023-07-31: 12.5 mg/h via INTRAVENOUS
  Administered 2023-07-31: 10 mg/h via INTRAVENOUS
  Administered 2023-08-01 (×3): 12.5 mg/h via INTRAVENOUS
  Administered 2023-08-03: 7.5 mg/h via INTRAVENOUS
  Administered 2023-08-03: 5 mg/h via INTRAVENOUS
  Administered 2023-08-04: 7.5 mg/h via INTRAVENOUS
  Filled 2023-07-30 (×10): qty 125

## 2023-07-30 MED ORDER — VANCOMYCIN HCL 1250 MG/250ML IV SOLN
1250.0000 mg | Freq: Once | INTRAVENOUS | Status: AC
  Administered 2023-07-30: 1250 mg via INTRAVENOUS
  Filled 2023-07-30: qty 250

## 2023-07-30 MED ORDER — THIAMINE HCL 100 MG/ML IJ SOLN
500.0000 mg | INTRAVENOUS | Status: AC
  Administered 2023-07-31 – 2023-08-06 (×7): 500 mg via INTRAVENOUS
  Filled 2023-07-30 (×7): qty 5

## 2023-07-30 MED ORDER — HYDROXYZINE HCL 25 MG PO TABS
25.0000 mg | ORAL_TABLET | Freq: Four times a day (QID) | ORAL | Status: AC | PRN
  Administered 2023-07-31 – 2023-08-02 (×4): 25 mg via ORAL
  Filled 2023-07-30 (×4): qty 1

## 2023-07-30 MED ORDER — LORAZEPAM 2 MG/ML IJ SOLN: 0.0000 mg | Freq: Three times a day (TID) | INTRAMUSCULAR | Status: DC

## 2023-07-30 MED ORDER — LORAZEPAM 1 MG PO TABS
1.0000 mg | ORAL_TABLET | Freq: Every day | ORAL | Status: AC
  Administered 2023-08-03: 1 mg via ORAL
  Filled 2023-07-30: qty 1

## 2023-07-30 MED ORDER — DILTIAZEM LOAD VIA INFUSION
10.0000 mg | Freq: Once | INTRAVENOUS | Status: AC
  Administered 2023-07-30: 10 mg via INTRAVENOUS
  Filled 2023-07-30: qty 10

## 2023-07-30 MED ORDER — ONDANSETRON 4 MG PO TBDP: 4.0000 mg | ORAL_TABLET | Freq: Four times a day (QID) | ORAL | Status: AC | PRN

## 2023-07-30 MED ORDER — LORAZEPAM 1 MG PO TABS
1.0000 mg | ORAL_TABLET | Freq: Four times a day (QID) | ORAL | Status: DC | PRN
Start: 2023-07-30 — End: 2023-08-02

## 2023-07-30 MED ORDER — ADULT MULTIVITAMIN W/MINERALS CH
1.0000 | ORAL_TABLET | Freq: Every day | ORAL | Status: DC
  Filled 2023-07-30: qty 1

## 2023-07-30 MED ORDER — THIAMINE MONONITRATE 100 MG PO TABS
100.0000 mg | ORAL_TABLET | Freq: Every day | ORAL | Status: DC
  Administered 2023-08-07 – 2023-08-27 (×21): 100 mg via ORAL
  Filled 2023-07-30 (×22): qty 1

## 2023-07-30 MED ORDER — THIAMINE HCL 100 MG/ML IJ SOLN
100.0000 mg | Freq: Every day | INTRAMUSCULAR | Status: DC
  Filled 2023-07-30: qty 2

## 2023-07-30 MED ORDER — POTASSIUM CHLORIDE CRYS ER 20 MEQ PO TBCR
40.0000 meq | EXTENDED_RELEASE_TABLET | Freq: Once | ORAL | Status: DC
  Filled 2023-07-30: qty 2

## 2023-07-30 MED ORDER — LORAZEPAM 1 MG PO TABS
1.0000 mg | ORAL_TABLET | Freq: Three times a day (TID) | ORAL | Status: AC
Start: 2023-07-31 — End: 2023-08-01
  Administered 2023-08-01: 1 mg via ORAL
  Filled 2023-07-30 (×2): qty 1

## 2023-07-30 MED ORDER — LORAZEPAM 1 MG PO TABS: 1.0000 mg | ORAL_TABLET | ORAL | Status: DC | PRN

## 2023-07-30 MED ORDER — LORAZEPAM 1 MG PO TABS
1.0000 mg | ORAL_TABLET | Freq: Two times a day (BID) | ORAL | Status: AC
  Administered 2023-08-01 – 2023-08-02 (×2): 1 mg via ORAL
  Filled 2023-07-30 (×2): qty 1

## 2023-07-30 MED ORDER — SODIUM CHLORIDE 0.9 % IV BOLUS
1000.0000 mL | Freq: Once | INTRAVENOUS | Status: AC
  Administered 2023-07-30: 1000 mL via INTRAVENOUS

## 2023-07-30 MED ORDER — FOLIC ACID 1 MG PO TABS
1.0000 mg | ORAL_TABLET | Freq: Every day | ORAL | Status: DC
  Administered 2023-08-02 – 2023-08-27 (×23): 1 mg via ORAL
  Filled 2023-07-30 (×26): qty 1

## 2023-07-30 MED ORDER — SODIUM CHLORIDE 0.9 % IV SOLN
2.0000 g | Freq: Three times a day (TID) | INTRAVENOUS | Status: DC
  Administered 2023-07-30 – 2023-08-01 (×6): 2 g via INTRAVENOUS
  Filled 2023-07-30 (×6): qty 12.5

## 2023-07-30 MED ORDER — DILTIAZEM HCL 25 MG/5ML IV SOLN
15.0000 mg | Freq: Once | INTRAVENOUS | Status: AC
  Administered 2023-07-30: 15 mg via INTRAVENOUS
  Filled 2023-07-30: qty 5

## 2023-07-30 MED ORDER — LORAZEPAM 2 MG/ML IJ SOLN: 1.0000 mg | INTRAMUSCULAR | Status: DC | PRN

## 2023-07-30 MED ORDER — LORAZEPAM 1 MG PO TABS
1.0000 mg | ORAL_TABLET | Freq: Four times a day (QID) | ORAL | Status: AC
Start: 2023-07-30 — End: 2023-07-31
  Administered 2023-07-30 – 2023-07-31 (×2): 1 mg via ORAL
  Filled 2023-07-30 (×4): qty 1

## 2023-07-30 MED ORDER — ADULT MULTIVITAMIN W/MINERALS CH
1.0000 | ORAL_TABLET | Freq: Every day | ORAL | Status: DC
  Administered 2023-08-02 – 2023-08-27 (×23): 1 via ORAL
  Filled 2023-07-30 (×26): qty 1

## 2023-07-30 MED ORDER — LORAZEPAM 2 MG/ML IJ SOLN
0.0000 mg | INTRAMUSCULAR | Status: DC
  Administered 2023-07-30 – 2023-07-31 (×4): 2 mg via INTRAVENOUS
  Filled 2023-07-30 (×4): qty 1

## 2023-07-30 MED ORDER — MAGNESIUM SULFATE 4 GM/100ML IV SOLN
4.0000 g | Freq: Once | INTRAVENOUS | Status: AC
  Administered 2023-07-30: 4 g via INTRAVENOUS
  Filled 2023-07-30: qty 100

## 2023-07-30 MED ORDER — LOPERAMIDE HCL 2 MG PO CAPS: 2.0000 mg | ORAL_CAPSULE | ORAL | Status: AC | PRN

## 2023-07-30 NOTE — Progress Notes (Signed)
 PROGRESS NOTE    Anthony A Masella Jr.  FMW:[id number] DOB: 1956/01/02 DOA: 07/28/2023 PCP: Pcp, No   Brief Narrative:  The patient is a 68 year old with history of severe alcohol  use with frequent withdrawal and delirium in the past, paroxysmal A-fib not on anticoagulation, HLD, HTN, recurrent PE, hiatal hernia, homelessness, and iron  deficiency comes to the hospital from a shelter due to delirium.  Continues to be confused there is concern for alcohol  withdrawal and encephalopathy workup is underway and patient went into A-fib with RVR overnight and is now on a diltiazem  drip.  Subsequently he also spiked a temperature and became tachycardic so we will need to work him up for infectious etiology and are checking blood cultures, urinalysis and urine culture and a chest x-ray.  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   -Currently on as needed Zyprexa  and Ativan .   -Ammonia and B12 are normal, TSH is as below.   -For now placed him on high-dose thiamine .   -CT of the head is negative.  Currently in restraints as he poses danger to self and others. -His confusion and encephalopathy today we will check an MRI and EEG; given that went into A-fib with RVR concern he may have had a stroke given his intermittent usage of Eliquis  -EEG done and showed This study is suggestive of mild diffuse encephalopathy. No seizures or epileptiform discharges were seen throughout the recording. -C/w CIWA Protocol -Have consulted psychiatry as he may require chronic medication  Recurrent Pulmonary Embolism -Continue Apixaban  but may need to go to Heparin  if refuses oral intake  Fever -Unclear etiology -Will check blood cultures x 2, urinalysis and urine culture and chest x-ray -Treated with supportive  care and will give him a bolus of normal saline 1 Liters -Will start Empiric Abx and then de-escalate as necessary but will need Cultures first   Essential Hypertension -Continue with IV hydralazine  10 mg Q4 as needed for systolic blood pressure greater than 180 -Currently on a diltiazem  drip for his A-fib with RVR -Continue to Monitor BP per Protocol -Last BP reading was 126/89  Paroxysmal Atrial Fibrillation now with RVR -Metoprolol  twice now stopped and placed on a diltiazem  drip.  IV as needed.  Already on Eliquis  but intermittent compliance with may need to go to heparin  drip -Recent ECHO done 3 weeks ago showed Hypokinesis of the inferior wall from the base to the mid wall. Left ventricular ejection fraction, by estimation, is 45 to 50%. The left ventricle has mildly decreased function. The left ventricle has no regional wall motion abnormalities. Left ventricular diastolic parameters are indeterminate. Right ventricular systolic function is mildly reduced. The right ventricular size is moderately enlarged. There is normal pulmonary artery  systolic pressure.  -Check Troponin I x2 -TSH was 2.967  Hypomagnesemia -Patient's Mag Level Trend: Recent Labs  Lab 07/07/23 1955 07/08/23 0422 07/09/23 0724 07/11/23 0730 07/12/23 0246 07/30/23 0403  MG 1.3* 1.2* 1.5* 1.4* 2.1 1.4*  -Replete with IV Mag Sulfate 4 grams  -Continue to Monitor and Replete as Necessary -Repeat Mag in the AM   Hyperlipidemia -Hold Statin but resume when oral intake is better  Macrocytic Anemia -Hgb/Hct Trend: Recent Labs  Lab 07/09/23 0724 07/10/23 0250 07/11/23 0730 07/12/23 1801 07/14/23 2321 07/27/23 1541 07/30/23 0403  HGB 9.8* 10.4* 9.7*  10.0* 9.0* 9.1* 9.9*  HCT 30.5* 32.9* 31.7* 32.3* 28.8* 29.4* 31.2*  MCV 96.2 98.8 101.3* 102.2* 102.5* 105.4* 103.7*  -Check Anemia Panel in the AM -Continue to Monitor for S/Sx of Bleeding; No overt bleeding noted -Repeat CBC in the AM    DVT  prophylaxis: SCDs Start: 07/28/23 1549 apixaban  (ELIQUIS ) tablet 5 mg    Code Status: Full Code Family Communication: No family present at bedside  Disposition Plan:  Level of care: Progressive Status is: Inpatient Remains inpatient appropriate because: Needs further clinical improvement and workup in his Fever and Encephalopathy    Consultants:  None  Procedures:  As delineated as above  Antimicrobials:  Anti-infectives (From admission, onward)    None       Subjective: Seen and in and examined at bedside remains significantly confused and is mumbling some words and is sometimes incomprehensible.  Per nursing he refuses oral medications.  Continues to be confused and spiked a temperature later in the day so we will need to work him up for infection.  Remains on a diltiazem  drip.  Has a sitter at bedside.  Objective: Vitals:   07/30/23 1700 07/30/23 1800 07/30/23 1854 07/30/23 1855  BP: (!) 124/93  126/89   Pulse:   (!) 105   Resp: (!) 26  (!) 23   Temp:  (!) 100.6 F (38.1 C)  (!) 100.4 F (38 C)  TempSrc:  Axillary  Axillary  SpO2:      Weight:      Height:        Intake/Output Summary (Last 24 hours) at 07/30/2023 1924 Last data filed at 07/30/2023 1812 Gross per 24 hour  Intake 383.37 ml  Output 1550 ml  Net -1166.63 ml   Filed Weights   07/28/23 2159  Weight: 64.7 kg   Examination: Physical Exam:  Constitutional: Chronically ill-appearing 68 year old Caucasian male Respiratory: Diminished to auscultation bilaterally, no wheezing, rales, rhonchi or crackles. Normal respiratory effort and patient is not tachypenic. No accessory muscle use.  Unlabored breathing Cardiovascular: Irregularly irregular, no murmurs / rubs / gallops. S1 and S2 auscultated. No extremity edema.   Abdomen: Soft, non-tender, non-distended. Bowel sounds positive.  GU: Deferred. Musculoskeletal: No clubbing / cyanosis of digits/nails. No joint deformity upper and lower  extremities. Skin: No rashes, lesions, ulcers on limited skin evaluation. No induration; Warm and dry.  Neurologic: CN 2-12 grossly intact with no focal deficits and is a Peake incomprehensible and very confused. Romberg sign and cerebellar reflexes not assessed.  Psychiatric: Impaired judgment and insight and he is awake but not fully alert  Data Reviewed: I have personally reviewed following labs and imaging studies  CBC: Recent Labs  Lab 07/27/23 1541 07/30/23 0403  WBC 3.1* 4.5  HGB 9.1* 9.9*  HCT 29.4* 31.2*  MCV 105.4* 103.7*  PLT 217 231   Basic Metabolic Panel: Recent Labs  Lab 07/27/23 1541 07/30/23 0403  NA 141 136  K 3.7 3.7  CL 109 105  CO2 22 22  GLUCOSE 79 84  BUN 16 15  CREATININE 0.55* 0.65  CALCIUM  8.9 8.5*  MG  --  1.4*  PHOS  --  3.7   GFR: Estimated Creatinine Clearance: 82 mL/min (by C-G formula based on SCr of 0.65 mg/dL). Liver Function Tests: Recent Labs  Lab 07/27/23 1541  AST 26  ALT 17  ALKPHOS 86  BILITOT 0.8  PROT 7.2  ALBUMIN  3.6   No results for input(s): LIPASE, AMYLASE in the last 168 hours.  Recent Labs  Lab 07/29/23 0859  AMMONIA 12   Coagulation Profile: No results for input(s): INR, PROTIME in the last 168 hours. Cardiac Enzymes: No results for input(s): CKTOTAL, CKMB, CKMBINDEX, TROPONINI in the last 168 hours. BNP (last 3 results) No results for input(s): PROBNP in the last 8760 hours. HbA1C: No results for input(s): HGBA1C in the last 72 hours. CBG: Recent Labs  Lab 07/27/23 1556  GLUCAP 74   Lipid Profile: No results for input(s): CHOL, HDL, LDLCALC, TRIG, CHOLHDL, LDLDIRECT in the last 72 hours. Thyroid Function Tests: Recent Labs    07/29/23 0859  TSH 2.967   Anemia Panel: Recent Labs    07/29/23 0859  VITAMINB12 303   Sepsis Labs: No results for input(s): PROCALCITON, LATICACIDVEN in the last 168 hours.  No results found for this or any previous visit  (from the past 240 hours).   Radiology Studies: DG Abd 1 View Result Date: 07/30/2023 CLINICAL DATA:  [id number] Abdominal discomfort [id number] EXAM: ABDOMEN - 1 VIEW COMPARISON:  X-ray abdomen 12/02/2022, CT abdomen pelvis 02/03/2023 FINDINGS: Bilateral upper abdomen/hemidiaphragms are collimated off view. Surgical clips overlie the left upper abdomen. The bowel gas pattern is normal. Limited evaluation for free gas on this supine radiograph. No radio-opaque calculi or other significant radiographic abnormality are seen. Vascular calcifications. IMPRESSION: Nonobstructive bowel gas pattern. Bilateral upper abdomen/hemidiaphragms are collimated off view. Electronically Signed   By: Morgane  Naveau M.D.   On: 07/30/2023 16:30   EEG adult Result Date: 07/30/2023 Shelton Arlin KIDD, MD     07/30/2023  1:41 PM Patient Name: Anthony A Kreider Jr. MRN: [id number] Epilepsy Attending: Arlin KIDD Shelton Referring Physician/Provider: Sherrill Alejandro Donovan, DO Date: 07/30/2023 Duration: 25.44 mins Patient history: 68yo M with ams getting eeg to evaluate for seizure Level of alertness: Awake AEDs during EEG study: Ativan  Technical aspects: This EEG study was done with scalp electrodes positioned according to the 10-20 International system of electrode placement. Electrical activity was reviewed with band pass filter of 1-70Hz , sensitivity of 7 uV/mm, display speed of 7mm/sec with a 60Hz  notched filter applied as appropriate. EEG data were recorded continuously and digitally stored.  Video monitoring was available and reviewed as appropriate. Description: The posterior dominant rhythm consists of 7.5 Hz activity of moderate voltage (25-35 uV) seen predominantly in posterior head regions, symmetric and reactive to eye opening and eye closing. EEG showed intermittent generalized 5 to 6 Hz theta slowing. Physiologic photic driving was not seen during photic stimulation.  \Hyperventilation was not performed.   ABNORMALITY - Intermittent slow,  generalized IMPRESSION: This study is suggestive of mild diffuse encephalopathy. No seizures or epileptiform discharges were seen throughout the recording. Priyanka O Yadav   Scheduled Meds:  apixaban   5 mg Oral BID   feeding supplement  237 mL Oral BID BM   folic acid   1 mg Oral Daily   LORazepam   0-4 mg Intravenous Q4H   Followed by   NOREEN ON 08/01/2023] LORazepam   0-4 mg Intravenous Q8H   LORazepam   1 mg Oral QID   Followed by   NOREEN ON 07/31/2023] LORazepam   1 mg Oral TID   Followed by   NOREEN ON 08/01/2023] LORazepam   1 mg Oral BID   Followed by   NOREEN ON 08/03/2023] LORazepam   1 mg Oral Daily   multivitamin with minerals  1 tablet Oral Daily   potassium chloride   40 mEq Oral Once   [START ON 08/07/2023] thiamine   100 mg Oral Daily  Or   [START ON 08/07/2023] thiamine   100 mg Intravenous Daily   Continuous Infusions:  diltiazem  (CARDIZEM ) infusion 5 mg/hr (07/30/23 1812)   sodium chloride      [START ON 07/31/2023] thiamine  (VITAMIN B1) injection      LOS: 1 day   Alejandro Marker, DO [facility] Available via Epic secure chat 7am-7pm After these hours, please refer to coverage provider listed on [email or web address] 07/30/2023, 7:24 PM

## 2023-07-30 NOTE — Progress Notes (Signed)
 Pharmacy Antibiotic Note  Anthony [name]. is a 68 y.o. male admitted on 07/28/2023 with delirium, encephalopathy.  Pharmacy has been consulted for Vancomycin  and Cefepime  dosing on 1/10 for Sepsis.  Plan: Per MD, wait for blood cultures to be drawn prior to antibiotics.  Cultures ordered at 19:15, called lab at 20:15 to get ETA.  Phlebotomist says that they have been drawn, but not received in Epic yet, so OK to start abx.   Cefepime  2g IV q8h Vancomycin  1250mg  IV x1 then 750 mg IV q12h  (SCr rounded 0.8, TBW, est AUC 444) Measure Vanc levels as needed.  Goal AUC = 400 - 550. Follow up renal function, culture results, and clinical course.   Height: 5' 8 (172.7 cm) Weight: 64.7 kg (142 lb 10.2 oz) IBW/kg (Calculated) : 68.4  Temp (24hrs), Avg:99 F (37.2 C), Min:98 F (36.7 C), Jguadalupe:100.6 F (38.1 C)  Recent Labs  Lab 07/27/23 1541 07/30/23 0403  WBC 3.1* 4.5  CREATININE 0.55* 0.65    Estimated Creatinine Clearance: 82 mL/min (by C-G formula based on SCr of 0.65 mg/dL).    Allergies  Allergen Reactions   Other Itching and Other (See Comments)    Seasonal allergies- Itchy eyes, runny nose, congestion   Poison Ivy Extract Rash    Antimicrobials this admission:  1/10 Cefepime   >> 1/10 Vancomycin  >>    Dose adjustments this admission:      Microbiology results:  1/10 BCx:   Thank you for allowing pharmacy to be a part of this patient's care.  Wanda Hasting PharmD, BCPS [facility] main pharmacy [phone] 07/30/2023 7:38 PM

## 2023-07-30 NOTE — Progress Notes (Signed)
 EEG complete - results pending

## 2023-07-30 NOTE — Procedures (Signed)
 Patient Name: Anthony A Moraes Jr.  MRN: [id number]  Epilepsy Attending: Arlin MALVA Krebs  Referring Physician/Provider: Sherrill Alejandro Donovan, DO  Date: 07/30/2023 Duration: 25.44 mins  Patient history: 68yo M with ams getting eeg to evaluate for seizure  Level of alertness: Awake  AEDs during EEG study: Ativan   Technical aspects: This EEG study was done with scalp electrodes positioned according to the 10-20 International system of electrode placement. Electrical activity was reviewed with band pass filter of 1-70Hz , sensitivity of 7 uV/mm, display speed of 44mm/sec with a 60Hz  notched filter applied as appropriate. EEG data were recorded continuously and digitally stored.  Video monitoring was available and reviewed as appropriate.  Description: The posterior dominant rhythm consists of 7.5 Hz activity of moderate voltage (25-35 uV) seen predominantly in posterior head regions, symmetric and reactive to eye opening and eye closing. EEG showed intermittent generalized 5 to 6 Hz theta slowing. Physiologic photic driving was not seen during photic stimulation.  \Hyperventilation was not performed.     ABNORMALITY - Intermittent slow, generalized  IMPRESSION: This study is suggestive of mild diffuse encephalopathy. No seizures or epileptiform discharges were seen throughout the recording.  Anthony [name]

## 2023-07-30 NOTE — Consult Note (Signed)
 Face-to-Face Psychiatry Consult   Reason for Consult:  significant recurrent agitation. Chronic. Would benefit from chronic medications Referring Physician:  Dr. Caleen  Patient Identification: Anthony [name]. 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   Anthony A Dusenbury Jr. Is a 68 year old male patient with a medical history significant for alcohol  abuse, prior delirium tremens (DTs), multiple pulmonary emboli, documented homelessness, and medication noncompliance is being admitted for delirium. Unfortunately, he has had several recent ER visits since late December, presenting with complaints of feeling cold and erratic behavior, but he has left against medical advice each time. Upon initial documentation, the patient was observed ambulating, but today, during evaluation, he was found in bed with bilateral soft wrist restraints. He is disoriented, alert to himself and his location, but unaware of the time or situation. Some confabulation, disturbed sensorium, restlessness, and ongoing irritability were noted. At one point, he became defensive, stating, I ain't been drinking no damn alcohol , when asked about alcohol  use. He was wearing a male PureWick device, which he continued to pick at, despite it being properly placed. Initially, the patient was highly disoriented, but after several hours, he became more verbal, oriented, and cooperative. However, he remains extremely weak, unsteady, and clearly unable to care for himself. His current presentation is consistent with delirium tremens, and he is at high risk for  Wernicke's encephalopathy. Treatment will include high-dose IV thiamine .   HPI:  Anthony [name]. is a 68 y.o. male.   68 year old male with a history of alcohol  abuse, hypertension, homelessness, CVA, chronic heart failure, atrial fibrillation, history of PE in July 2024 comes in with chief complaint of homelessness.   Patient was seen in the ER multiple times yesterday.  It appears that he was sent to the [facility], likely moved to the [facility] and they had him leave because he was loud.   Patient has no complaints from his side. Upon further questioning, he said he has pain all over.  He confirms he is homeless.  He is oriented to self, location but thinks it is 68.  He states that normally he will just sleep wherever he can find a place to rest, but [facility].  He denies any substance use.   Assessment:  The patient is alert and oriented to person, place, and time, and is able to make his needs known. However, he is hard of hearing and tends to mumble when speaking, which may impact communication. The patient does not appear to be responding to internal stimuli, psychotic, or manic. He has expressed no interest in alcohol  use disorder treatment or abstinence at this time and minimizes alcohol  use. Although it is obvious patient is experiencing withdrawal and confusion, it is difficult to obtain an accurate history.   On examination, the patient is disoriented and restless, though his ability to be properly assessed for tremors is limited due to restraints. However, his symptoms are consistent with alcohol  withdrawal. Notably, his heart rate is elevated,he is anxious, irritable, agitated, confused and he was recently treated for atrial fibrillation overnight. Given the patient's  current presentation, these symptoms are suggestive of alcohol  withdrawal, and continued monitoring is necessary to ensure appropriate management and prevent complications such as seizures or  delirium tremens.  Past Psychiatric History:  Dx: depression, anxiety, alcohol  [use disorder] Denies h/o psych hosp Denies h/o suicide attempts  Risk to Self:  denies Risk to Others:  denies Prior Inpatient Therapy:  denies, chart review shows 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  Per his chart from [place] , he fell off two storyhouse roof while cleaning his brother's gutters and sustained compression fracture of L3, L4 and L5 and fracture of right transverse process at L3 and nondisplaced fracture of left glenoid rim many years ago. 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 And 12/2017 admissions    History of pulmonary embolus (PE) 11/02/2016   Unprovoked 11/01/2016. Xarelto  from 11/01/16 to 09/08/2017.   Hydrocele    Surgically corrected   Hypertension    Hypoalbuminemia due to protein-calorie malnutrition (HCC) 07/22/2020   Lumbar compression fracture (HCC)    Multiple rib fractures 07/22/2019   Left rib details XR: left ribs demonstrate multiple remote rib   Pancytopenia (HCC)    Rhabdomyolysis 07/22/2020   Skin  cancer    exciced 2017   Tinea versicolor 06/08/2013   Tobacco use disorder 07/22/2020   Tooth infection 04/25/2018   Trigger finger, acquired 11/18/2012   Ulnar tunnel syndrome of right wrist 11/08/2012    Past Surgical History:  Procedure Laterality Date   BIOPSY  05/21/2022   Procedure: BIOPSY;  Surgeon: Charlanne Groom, MD;  Location: [facility] ENDOSCOPY;  Service: Gastroenterology;;   CATARACT EXTRACTION     ESOPHAGOGASTRODUODENOSCOPY (EGD) WITH PROPOFOL  N/A 05/21/2022   Procedure: ESOPHAGOGASTRODUODENOSCOPY (EGD) WITH PROPOFOL ;  Surgeon: Charlanne Groom, MD;  Location: [facility] ENDOSCOPY;  Service: Gastroenterology;  Laterality: N/A;   FOREIGN BODY REMOVAL N/A 05/21/2022   Procedure: FOREIGN BODY REMOVAL;  Surgeon: Charlanne Groom, MD;  Location: [facility] ENDOSCOPY;  Service: Gastroenterology;  Laterality: N/A;   HIATAL HERNIA REPAIR  2016   HYDROCELE EXCISION / REPAIR  2010   SKIN CANCER EXCISION Left    Excised 2017   Family History:  Family History  Problem Relation Age of Onset   Heart attack Father        died at 44 years   Dementia Father    Stroke Father    Cancer Sister    Colon cancer Neg Hx    Colon polyps Neg Hx    Esophageal cancer Neg Hx    Stomach cancer Neg Hx    Rectal cancer Neg Hx    Family Psychiatric  History: does not report  Social History:  Social History   Substance and Sexual Activity  Alcohol  Use Yes   Alcohol /week: 43.0 standard drinks of alcohol    Types: 42 Cans of beer, 1 Standard drinks or equivalent per week     Social History   Substance and Sexual Activity  Drug Use Yes   Types: Marijuana    Social History   Socioeconomic History   Marital status: Single    Spouse name: Not on file   Number of children: Not on file   Years of education: Not on file   Highest education level: Not on file  Occupational History   Occupation: unemployed  Tobacco Use   Smoking status: Never   Smokeless tobacco: Current    Types: Snuff   Tobacco comments:    Pt  states do snuff when stressed had 1 can last 3 month//PL  Vaping Use   Vaping status: Never Used  Substance and Sexual Activity   Alcohol  use: Yes    Alcohol /week: 43.0 standard drinks of alcohol     Types: 42 Cans of beer, 1 Standard drinks or equivalent per week   Drug use: Yes    Types: Marijuana   Sexual activity: Yes    Birth control/protection: None  Other Topics Concern   Not on file  Social History Narrative   Lives with a friend. Has children but hasn't met them in a while. He doesn't know where they live.    Social Drivers of Corporate Investment Banker Strain: Not on file  Food Insecurity: Patient Unable To Answer (07/28/2023)   Hunger Vital Sign    Worried About Running Out of Food in the Last Year: Patient unable to answer    Ran Out of Food in the Last Year: Patient unable to answer  Recent Concern: Food Insecurity - Food Insecurity Present (07/08/2023)   Hunger Vital Sign    Worried About Programme Researcher, Broadcasting/film/video in the Last Year: Often true    Ran Out of Food in the Last Year: Often true  Transportation Needs: Patient Unable To Answer (07/28/2023)   PRAPARE - Administrator, Civil Service (Medical): Patient unable to answer    Lack of Transportation (Non-Medical): Patient unable to answer  Recent Concern: Transportation Needs - Unmet Transportation Needs (07/08/2023)   PRAPARE - Administrator, Civil Service (Medical): Yes    Lack of Transportation (Non-Medical): Yes  Physical Activity: Not on file  Stress: Not on file  Social Connections: Patient Unable To Answer (07/28/2023)   Social Connection and Isolation Panel [NHANES]    Frequency of Communication with Friends and Family: Patient unable to answer    Frequency of Social Gatherings with Friends and Family: Patient unable to answer    Attends Religious Services: Patient unable to answer    Active Member of Clubs or Organizations: Patient unable to answer    Attends Banker  Meetings: Patient unable to answer    Marital Status: Patient unable to answer   Additional Social History:    Allergies:   Allergies  Allergen Reactions   Other Itching and Other (See Comments)    Seasonal allergies- Itchy eyes, runny nose, congestion   Poison Ivy Extract Rash    Labs:  Results for orders placed or performed during the hospital encounter of 07/28/23 (from the past 48 hours)  Ammonia     Status: None   Collection Time: 07/29/23  8:59 AM  Result Value Ref Range   Ammonia 12 9 -  35 umol/L    Comment: Performed at [facility], [address] [place], [place] [address]  TSH     Status: None   Collection Time: 07/29/23  8:59 AM  Result Value Ref Range   TSH 2.967 0.350 - 4.500 uIU/mL    Comment: Performed by a 3rd Generation assay with a functional sensitivity of <=0.01 uIU/mL. Performed at [facility], [address] [place], [place] [address]   Vitamin B12     Status: None   Collection Time: 07/29/23  8:59 AM  Result Value Ref Range   Vitamin B-12 303 180 - 914 pg/mL    Comment: (NOTE) This assay is not validated for testing neonatal or myeloproliferative syndrome specimens for Vitamin B12 levels. Performed at [facility], [address] [place], [place] [address]   CBC     Status: Abnormal   Collection Time: 07/30/23  4:03 AM  Result Value Ref Range   WBC 4.5 4.0 - 10.5 K/uL   RBC 3.01 (L) 4.22 - 5.81 MIL/uL   Hemoglobin 9.9 (L) 13.0 - 17.0 g/dL   HCT 68.7 (L) 60.9 - 47.9 %   MCV 103.7 (H) 80.0 - 100.0 fL   MCH 32.9 26.0 - 34.0 pg   MCHC 31.7 30.0 - 36.0 g/dL   RDW 80.7 (H) 88.4 - 84.4 %   Platelets 231 150 - 400 K/uL   nRBC 0.0 0.0 - 0.2 %    Comment: Performed at [facility], [address], [place], [place] [address]  Basic metabolic panel     Status: Abnormal   Collection Time: 07/30/23  4:03 AM  Result Value Ref Range   Sodium 136 135 - 145 mmol/L   Potassium  3.7 3.5 - 5.1 mmol/L   Chloride 105 98 - 111 mmol/L   CO2 22 22 - 32 mmol/L   Glucose, Bld 84 70 - 99 mg/dL    Comment: Glucose reference range applies only to samples taken after fasting for at least 8 hours.   BUN 15 8 - 23 mg/dL   Creatinine, Ser 9.34 0.61 - 1.24 mg/dL   Calcium  8.5 (L) 8.9 - 10.3 mg/dL   GFR, Estimated >39 >39 mL/min    Comment: (NOTE) Calculated using the CKD-EPI Creatinine Equation (2021)    Anion gap 9 5 - 15    Comment: Performed at [facility], [address], [place], [place] [address]  Phosphorus     Status: None   Collection Time: 07/30/23  4:03 AM  Result Value Ref Range   Phosphorus 3.7 2.5 - 4.6 mg/dL    Comment: Performed at [facility], [address], [place], [place] [address]  Magnesium      Status: Abnormal   Collection Time: 07/30/23  4:03 AM  Result Value Ref Range   Magnesium  1.4 (L) 1.7 - 2.4 mg/dL    Comment: Performed at [facility], [address] [place], [place] [address]    Current Facility-Administered Medications  Medication Dose Route Frequency Provider Last Rate Last Admin   acetaminophen  (TYLENOL ) tablet 650 mg  650 mg Oral Q6H PRN Zella, Mir M, MD       Or   acetaminophen  (TYLENOL ) suppository 650 mg  650 mg Rectal Q6H PRN Zella, Mir M, MD       apixaban  (ELIQUIS ) tablet 5 mg  5 mg Oral BID Zella, Mir M, MD   5 mg at 07/29/23 2133  diltiazem  (CARDIZEM ) 125 mg in dextrose  5% 125 mL (1 mg/mL) infusion  5-15 mg/hr Intravenous Continuous Andrez Chroman, NP 5 mL/hr at 07/30/23 0924 5 mg/hr at 07/30/23 9075   feeding supplement (ENSURE ENLIVE / ENSURE PLUS) liquid 237 mL  237 mL Oral BID BM Zella, Mir M, MD   237 mL at 07/29/23 1447   hydrALAZINE  (APRESOLINE ) injection 10 mg  10 mg Intravenous Q4H PRN Amin, Ankit C, MD       ipratropium-albuterol  (DUONEB) 0.5-2.5 (3) MG/3ML nebulizer solution 3 mL  3 mL Nebulization Q4H PRN Amin, Ankit C, MD        LORazepam  (ATIVAN ) injection 2 mg  2 mg Intravenous Q4H PRN Amin, Ankit C, MD   2 mg at 07/30/23 0902   metoprolol  tartrate (LOPRESSOR ) injection 5 mg  5 mg Intravenous Q4H PRN Amin, Ankit C, MD   5 mg at 07/30/23 0543   OLANZapine  (ZYPREXA ) injection 5 mg  5 mg Intramuscular Q6H PRN Amin, Ankit C, MD   5 mg at 07/30/23 1300   ondansetron  (ZOFRAN ) injection 4 mg  4 mg Intravenous Q6H PRN Amin, Ankit C, MD       potassium chloride  SA (KLOR-CON  M) CR tablet 40 mEq  40 mEq Oral Once Sheikh, Omair Latif, DO       [START ON 07/31/2023] thiamine  (VITAMIN B1) 500 mg in sodium chloride  0.9 % 50 mL IVPB  500 mg Intravenous Q24H Anthony Majel RAMAN, FNP        Musculoskeletal: Strength & Muscle Tone: Laying in bed   Gait & Station: Laying in bed   Patient leans: Laying in bed              Psychiatric Specialty Exam:  Presentation  General Appearance:  Appropriate for Environment; Disheveled  Eye Contact: Minimal  Speech: Slurred  Speech Volume: Normal  Handedness: Right    Mood and Affect  Mood: Irritable  Affect: Congruent; Full Range; Labile   Thought Process  Thought Processes: Irrevelant  Descriptions of Associations:Loose  Orientation:Partial  Thought Content:Illogical; Tangential; Scattered  History of Schizophrenia/Schizoaffective disorder:No data recorded Duration of Psychotic Symptoms:No data recorded Hallucinations:Hallucinations: None   Ideas of Reference:None  Suicidal Thoughts:Suicidal Thoughts: No   Homicidal Thoughts:Homicidal Thoughts: No    Sensorium  Memory: Immediate Poor; Recent Poor; Remote Poor  Judgment: Poor  Insight: Lacking   Executive Functions  Concentration: Fair  Attention Span: Poor  Recall: Poor   Fund of Knowledge: Fair   Language: Fair    Psychomotor Activity  Psychomotor Activity: Psychomotor Activity: Normal    Assets  Assets: Communication Skills; Resilience; Social  Support    Sleep  Sleep: Sleep: Poor    Physical Exam: Physical Exam Vitals reviewed.  Constitutional:      General: He is not in acute distress.    Appearance: He is not toxic-appearing.  Pulmonary:     Effort: Pulmonary effort is normal.  Neurological:     Mental Status: He is alert.    Review of Systems  Constitutional:  Negative for chills and fever.  Psychiatric/Behavioral:  Positive for depression and substance abuse. Negative for hallucinations and suicidal ideas. The patient has insomnia. The patient is not nervous/anxious.    Blood pressure 108/88, pulse (!) 112, temperature 98 F (36.7 C), temperature source Oral, resp. rate 17, height 5' 8 (1.727 m), weight 64.7 kg, SpO2 95%. Body mass index is 21.69 kg/m.  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 -Start remeron  7.5 mg qhs. For mdd, sleep, appetite.  -Ciwa and ativan  detox protocol -Continue with IV thiamine  for 9 doses (adjustment mad primary team already ordered).  -medical recommendations as per primary team -pt would benefit from substance use treatment - social worker can offer available community options   Disposition: No evidence of imminent risk to self or others at present.   Patient does not meet criteria for psychiatric inpatient admission. Supportive therapy provided about ongoing stressors. Discussed crisis plan, support from social network, calling 911, coming to the Emergency Department, and calling Suicide Hotline.   Majel GORMAN Ramp, FNP 07/30/2023 1:30 PM

## 2023-07-30 NOTE — Progress Notes (Signed)
 Received patient from 5W. Alert to self only. HR was in 120s, 15 mg Cardizem given as ordered. Confused and restless, trying to get out of the bed. Ativan given. Will continue to monitor.

## 2023-07-30 NOTE — TOC Initial Note (Signed)
 Transition of Care (TOC) - Initial/Assessment Note    Patient Details  Name: Anthony A Lansdowne Jr. MRN: [id number] Date of Birth: 03-15-1956  Transition of Care [facility]) CM/SW Contact:    Tawni CHRISTELLA Eva, LCSW Phone Number: 07/30/2023, 9:11 AM  Clinical Narrative:                 Per chart review, the pt is homeless and from a shelter. Patient is not a candidate for SNF due to the lack of a discharge plan after SNF. Pt will return to the shelter after hospitalization. TOC will follow for discharge needs.  Expected Discharge Plan: Homeless Shelter Barriers to Discharge: Continued Medical Work up   Patient Goals and CMS Choice Patient states their goals for this hospitalization and ongoing recovery are:: [facility]          Expected Discharge Plan and Services                                              Prior Living Arrangements/Services              Need for Family Participation in Patient Care: No (Comment) Care giver support system in place?: No (comment)      Activities of Daily Living   ADL Screening (condition at time of admission) Independently performs ADLs?: No Does the patient have a NEW difficulty with bathing/dressing/toileting/self-feeding that is expected to last >3 days?: Yes (Initiates electronic notice to provider for possible OT consult) Does the patient have a NEW difficulty with getting in/out of bed, walking, or climbing stairs that is expected to last >3 days?: Yes (Initiates electronic notice to provider for possible PT consult) Does the patient have a NEW difficulty with communication that is expected to last >3 days?: Yes (Initiates electronic notice to provider for possible SLP consult) Is the patient deaf or have difficulty hearing?: Yes Does the patient have difficulty seeing, even when wearing glasses/contacts?: Yes Does the patient have difficulty concentrating, remembering, or making decisions?:  Yes  Permission Sought/Granted                  Emotional Assessment              Admission diagnosis:  Delirium [R41.0] Homelessness [Z59.00] Delirious [R41.0] Agitation [R45.1] Patient Active Problem List   Diagnosis Date Noted   Agitation 07/29/2023   History of CVA (cerebrovascular accident) 07/11/2023   Acute pulmonary embolism (HCC) 07/08/2023   Lactic acidosis 07/08/2023   Rib fractures 02/04/2023   Hypoglycemia 02/04/2023   Pulmonary embolus (HCC) 02/04/2023   Pulmonary embolism (HCC) 02/03/2023   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 difficile colitis 09/11/2022   Aspiration pneumonia (HCC) 08/27/2022   Aortic atherosclerosis (HCC) 08/27/2022   Depression, unspecified 08/09/2022   Hyponatremia 06/01/2022   Non compliance w medication regimen 05/31/2022   Delirium 05/25/2022   Stricture and stenosis of esophagus 05/21/2022   Gastritis and gastroduodenitis 05/21/2022   Loose stools 05/20/2022   Malnutrition of moderate degree 05/20/2022   Acute on chronic heart failure (HCC)    Leukocytosis 05/19/2022  Sepsis (HCC) 05/19/2022   Cellulitis of left leg, possible 05/19/2022    Class: Question of   Moniliasis, interdigital 05/19/2022   Open toe wound, right foot, medial fourth digit 05/19/2022   Malnutrition (HCC) 05/19/2022   Alcohol  abuse 03/24/2022   Dyslipidemia 03/24/2022   Acute on chronic diastolic CHF (congestive heart failure) (HCC) 03/24/2022   Acute respiratory failure with hypoxia (HCC) 03/23/2022   Generalized weakness    Atrial fibrillation with rapid ventricular response (HCC) 03/15/2022   Hypokalemia 03/15/2022   Decreased functional mobility  and endurance 06/22/2021   Iron  deficiency anemia 10/16/2020   Cough    Hypomagnesemia    Protein-calorie malnutrition, severe 08/22/2020   Alcohol  use disorder, severe, in controlled environment (HCC)    Pressure injury of skin 08/18/2020   Urinary tract infection without hematuria    Severe sepsis (HCC) 08/14/2020   Paroxysmal atrial fibrillation (HCC) 08/13/2020   Rhabdomyolysis 07/22/2020   Cerebral ventriculomegaly 07/22/2020   Hemoglobin decreased 07/22/2020   History of delirium tremons 07/22/2020   Hypoalbuminemia due to protein-calorie malnutrition (HCC) 07/22/2020   Tobacco use disorder 07/22/2020   Alcohol  withdrawal delirium (HCC)    Dehydration    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) 07/18/2018   Depression 05/26/2018   Anxiety state 04/25/2018   Need for immunization against influenza 04/25/2018   Alcohol  withdrawal (HCC) 09/01/2017   DOE (dyspnea on exertion) 11/27/2016   Actinic keratosis 11/27/2016   Macrocytic anemia 11/23/2016   History of pulmonary embolus (PE) 11/02/2016   Hiatal hernia 09/14/2016   Skin cancer 08/13/2016   Poor social situation 06/09/2013   Tinea versicolor 06/08/2013   Back pain 05/07/2013   Seborrheic dermatitis of scalp 11/08/2012   HTN (hypertension) 04/11/2012   Alcohol  use disorder, severe, dependence (HCC) 09/16/2006   PCP:  Pcp, No Pharmacy:   DARRYLE LONG - [facility] [address] [place] [address] Phone: [phone] Fax: [phone]     Social Drivers of Health (SDOH) Social History: SDOH Screenings   Food Insecurity: Patient Unable To Answer (07/28/2023)  Recent Concern: Food Insecurity - Food Insecurity Present (07/08/2023)  Housing: Patient Unable To Answer (07/28/2023)  Recent Concern: Housing - High Risk (07/08/2023)  Transportation Needs: Patient Unable To Answer (07/28/2023)  Recent Concern: Transportation Needs - Unmet Transportation Needs (07/08/2023)  Utilities:  Patient Unable To Answer (07/28/2023)  Recent Concern: Utilities - At Risk (07/08/2023)  Depression (PHQ2-9): Medium Risk (10/16/2020)  Social Connections: Patient Unable To Answer (07/28/2023)  Tobacco Use: High Risk (07/28/2023)   SDOH Interventions:     Readmission Risk Interventions    07/12/2023    9:58 AM 02/08/2023    5:25 PM 10/12/2022    1:29 PM  Readmission Risk Prevention Plan  Transportation Screening Complete Complete Complete  Medication Review Oceanographer) Complete Complete Complete  PCP or Specialist appointment within 3-5 days of discharge -- Complete Complete  HRI or Home Care Consult Complete Complete Complete  SW Recovery Care/Counseling Consult Complete Complete Complete  Palliative Care Screening Not Applicable Not Applicable Not Applicable  Skilled Nursing Facility Not Applicable Complete Not Applicable

## 2023-07-30 NOTE — Progress Notes (Signed)
    Patient Name: Anthony A Hair Jr.           DOB: Aug 12, 1955  MRN: [id number]      Admission Date: 07/28/2023  Attending Provider: Caleen Burgess BROCKS, MD  Primary Diagnosis: Delirium   Level of care: Progressive    CROSS COVER NOTE   Date of Service   07/30/2023   Nathanial A Morgan Raddle., 68 y.o. male, was admitted on 07/28/2023 for Delirium.    HPI/Events of Note   Atrial Fibrillation with RVR; HR 130's Hx Paroxysmal A-fib Exacerbated after having episodes of agitation.  HR sustaining 130s at rest.  Hemodynamically stable. BP 132/110 after he received IV metoprolol . HR sustaining 120-130s.  Given stable BP, will try Cardizem  IV push.  Consider Cardizem  gtt. if still not rate controlled. Already on Eliquis  twice daily, there is prior documentation that patient can be noncompliant on anticoagulants. TSH normal.  Notified by RN that 5 W does not have telemetry capacity.  Orders placed for patient to transfer to progressive unit.   Addendum- HR 90-100's after Cardizem  push  0615- sustaining HR> 120 despite recent lopressor . Adding cardizem  bolus and gtt.  Mag 1.4. replacement ordered.    Interventions/ Plan   EKG  Level of care- progressive  Cardiac Telemetry Labs --> BMP, Mag, phosp        Lavanda Horns, DNP, ACNPC- [facility]

## 2023-07-30 NOTE — Progress Notes (Signed)
   07/30/23 0050  Assess: MEWS Score  Pulse Rate (!) 118 (pt is agitated, restless)  SpO2 98 %  O2 Device Room Air  Assess: MEWS Score  MEWS Temp 0  MEWS Systolic 0  MEWS Pulse 2  MEWS RR 0  MEWS LOC 0  MEWS Score 2  MEWS Score Color Yellow  Assess: if the MEWS score is Yellow or Red  Were vital signs accurate and taken at a resting state? No, vital signs rechecked  Does the patient meet 2 or more of the SIRS criteria? No  MEWS guidelines implemented  No, previously yellow, continue vital signs every 4 hours  Notify: Charge Nurse/RN  Name of Charge Nurse/RN Notified April Townes, [place]  Provider Notification  Provider Name/Title Lavanda Horns, NP  Date Provider Notified 07/30/23  Time Provider Notified [phone]  Method of Notification Page (secure chat)  Notification Reason Other (Comment) (yellow mews HR 120's. pt agitated and prn IM haldol  given.)  Provider response No new orders  Date of Provider Response 07/30/23  Time of Provider Response 0059  Assess: SIRS CRITERIA  SIRS Temperature  0  SIRS Respirations  0  SIRS Pulse 1  SIRS WBC 0  SIRS Score Sum  1

## 2023-07-30 NOTE — Progress Notes (Signed)
   07/30/23 0126  Vitals  Temp 99.6 F (37.6 C)  Temp Source Oral  BP (!) 143/116  MAP (mmHg) 124  BP Location Right Arm  BP Method Automatic  Patient Position (if appropriate) Lying  Pulse Rate (!) 132  Resp 20  MEWS COLOR  MEWS Score Color Yellow  Oxygen Therapy  SpO2 99 %  O2 Device Room Air  MEWS Score  MEWS Temp 0  MEWS Systolic 0  MEWS Pulse 3  MEWS RR 0  MEWS LOC 0  MEWS Score 3  Provider Notification  Provider Name/Title Lavanda Horns, NP  Date Provider Notified 07/30/23  Time Provider Notified 0128  Method of Notification Page (secure chat)  Notification Reason Other (Comment) (HR in 120's - 130's. pt restless, but not agitated. need order for EKG)  Provider response See new orders (EKG)  Date of Provider Response 07/30/23  Time of Provider Response 0134  Rapid Response Notification  Name of Rapid Response RN Notified Mandy, RN  Date Rapid Response Notified 07/30/23  Time Rapid Response Notified 0130    EKG done as ordered- A.fib with RVR. Lavanda Horns, NP and Lorn, Rapid RN notified and updated pt status.  HR still sustaining in 130's at rest. IV lopressor  given as ordered- see emar.   Per Rapid response nurse Lorn, RN -pt assigned to bed # 1231 (ICU/Stepdown) and told to give bedside RN report. While transferring patient, room number updated by Lavanda Horns, NP for patient to transfer to progressive care unit , room 1433.  While leaving unit, Chrge RN- April Townes, RN verbalizes pt going to room 1433 and notified progressive unit by charge RN.   Patient transported via bed with this RN and Berenda PARAS, RN at bedside and sitter at bedside. Patient belongings sent with patient.  Bedside report given to Joanne, Felar. RN.

## 2023-07-31 ENCOUNTER — Inpatient Hospital Stay (HOSPITAL_COMMUNITY): Payer: 59

## 2023-07-31 ENCOUNTER — Other Ambulatory Visit: Payer: Self-pay

## 2023-07-31 DIAGNOSIS — F10931 Alcohol use, unspecified with withdrawal delirium: Secondary | ICD-10-CM | POA: Diagnosis not present

## 2023-07-31 DIAGNOSIS — R410 Disorientation, unspecified: Secondary | ICD-10-CM | POA: Diagnosis not present

## 2023-07-31 DIAGNOSIS — R451 Restlessness and agitation: Secondary | ICD-10-CM | POA: Diagnosis not present

## 2023-07-31 LAB — CBC
HCT: 33.9 % — ABNORMAL LOW (ref 39.0–52.0)
Hemoglobin: 11 g/dL — ABNORMAL LOW (ref 13.0–17.0)
MCH: 32.7 pg (ref 26.0–34.0)
MCHC: 32.4 g/dL (ref 30.0–36.0)
MCV: 100.9 fL — ABNORMAL HIGH (ref 80.0–100.0)
Platelets: 204 10*3/uL (ref 150–400)
RBC: 3.36 MIL/uL — ABNORMAL LOW (ref 4.22–5.81)
RDW: 18.4 % — ABNORMAL HIGH (ref 11.5–15.5)
WBC: 8.5 10*3/uL (ref 4.0–10.5)
nRBC: 0 % (ref 0.0–0.2)

## 2023-07-31 LAB — COMPREHENSIVE METABOLIC PANEL
ALT: 13 U/L (ref 0–44)
AST: 25 U/L (ref 15–41)
Albumin: 3.1 g/dL — ABNORMAL LOW (ref 3.5–5.0)
Alkaline Phosphatase: 80 U/L (ref 38–126)
Anion gap: 8 (ref 5–15)
BUN: 11 mg/dL (ref 8–23)
CO2: 18 mmol/L — ABNORMAL LOW (ref 22–32)
Calcium: 8.4 mg/dL — ABNORMAL LOW (ref 8.9–10.3)
Chloride: 101 mmol/L (ref 98–111)
Creatinine, Ser: 0.32 mg/dL — ABNORMAL LOW (ref 0.61–1.24)
GFR, Estimated: >60 mL/min (ref >60)
Glucose, Bld: 109 mg/dL — ABNORMAL HIGH (ref 70–99)
Potassium: 3.4 mmol/L — ABNORMAL LOW (ref 3.5–5.1)
Sodium: 127 mmol/L — ABNORMAL LOW (ref 135–145)
Total Bilirubin: 1.3 mg/dL — ABNORMAL HIGH (ref 0.0–1.2)
Total Protein: 7 g/dL (ref 6.5–8.1)

## 2023-07-31 LAB — PROTIME-INR
INR: 1.5 — ABNORMAL HIGH (ref 0.8–1.2)
Prothrombin Time: 18.1 s — ABNORMAL HIGH (ref 11.4–15.2)

## 2023-07-31 LAB — APTT: aPTT: 30 s (ref 24–36)

## 2023-07-31 LAB — PHOSPHORUS: Phosphorus: 3 mg/dL (ref 2.5–4.6)

## 2023-07-31 LAB — RPR: RPR Ser Ql: NONREACTIVE

## 2023-07-31 LAB — MAGNESIUM: Magnesium: 1.4 mg/dL — ABNORMAL LOW (ref 1.7–2.4)

## 2023-07-31 LAB — HEPARIN LEVEL (UNFRACTIONATED): Heparin Unfractionated: >1.1 [IU]/mL — ABNORMAL HIGH (ref 0.30–0.70)

## 2023-07-31 MED ORDER — HEPARIN (PORCINE) 25000 UT/250ML-% IV SOLN
1200.0000 [IU]/h | INTRAVENOUS | Status: AC
  Administered 2023-07-31 – 2023-08-01 (×2): 1200 [IU]/h via INTRAVENOUS
  Filled 2023-07-31 (×2): qty 250

## 2023-07-31 MED ORDER — MAGNESIUM SULFATE 4 GM/100ML IV SOLN
4.0000 g | Freq: Once | INTRAVENOUS | Status: AC
  Administered 2023-07-31: 4 g via INTRAVENOUS
  Filled 2023-07-31: qty 100

## 2023-07-31 MED ORDER — SODIUM CHLORIDE 0.9 % IV BOLUS
1000.0000 mL | Freq: Once | INTRAVENOUS | Status: AC
Start: 2023-07-31 — End: 2023-07-31
  Administered 2023-07-31: 1000 mL via INTRAVENOUS

## 2023-07-31 MED ORDER — SODIUM CHLORIDE 0.9 % IV SOLN: INTRAVENOUS | Status: AC

## 2023-07-31 MED ORDER — POTASSIUM CHLORIDE 10 MEQ/100ML IV SOLN
10.0000 meq | INTRAVENOUS | Status: AC
  Administered 2023-07-31 – 2023-08-01 (×4): 10 meq via INTRAVENOUS
  Filled 2023-07-31 (×4): qty 100

## 2023-07-31 NOTE — Progress Notes (Signed)
   07/31/23 0800  Assess: MEWS Score  Temp 98.9 F (37.2 C)  BP (!) 140/90  MAP (mmHg) 105  Pulse Rate (!) 114  ECG Heart Rate (!) 113  Resp (!) 25  Level of Consciousness Alert  SpO2 97 %  O2 Device Nasal Cannula  Patient Activity (if Appropriate) In bed  O2 Flow Rate (L/min) 3 L/min  Assess: MEWS Score  MEWS Temp 0  MEWS Systolic 0  MEWS Pulse 2  MEWS RR 1  MEWS LOC 0  MEWS Score 3  MEWS Score Color Yellow  Assess: if the MEWS score is Yellow or Red  Were vital signs accurate and taken at a resting state? Yes  Does the patient meet 2 or more of the SIRS criteria? Yes  Does the patient have a confirmed or suspected source of infection? No  MEWS guidelines implemented  No, previously yellow, continue vital signs every 4 hours  Notify: Charge Nurse/RN  Name of Charge Nurse/RN Notified Stefano Cassette, RN  Assess: SIRS CRITERIA  SIRS Temperature  0  SIRS Respirations  1  SIRS Pulse 1  SIRS WBC 0  SIRS Score Sum  2

## 2023-07-31 NOTE — Progress Notes (Signed)
 PHARMACY - ANTICOAGULATION CONSULT NOTE  Pharmacy Consult for IV heparin  Indication: atrial fibrillation, recurrent PE  Allergies  Allergen Reactions   Other Itching and Other (See Comments)    Seasonal allergies- Itchy eyes, runny nose, congestion   Poison Ivy Extract Rash    Patient Measurements: Height: 5' 8 (172.7 cm) Weight: 64.7 kg (142 lb 10.2 oz) IBW/kg (Calculated) : 68.4 Heparin  Dosing Weight: total body weight  Vital Signs: Temp: 98.6 F (37 C) (01/11 1600) Temp Source: Axillary (01/11 1600) BP: 97/70 (01/11 1600) Pulse Rate: 93 (01/11 1600)  Labs: Recent Labs    07/30/23 0403 07/30/23 2007 07/30/23 2126 07/31/23 0512 07/31/23 1114 07/31/23 1816  HGB 9.9*  --   --  11.0*  --   --   HCT 31.2*  --   --  33.9*  --   --   PLT 231  --   --  204  --   --   APTT  --   --   --   --   --  30  LABPROT  --   --   --   --   --  18.1*  INR  --   --   --   --   --  1.5*  HEPARINUNFRC  --   --   --   --   --  >1.10*  CREATININE 0.65  --   --   --  0.32*  --   TROPONINIHS  --  8 8  --   --   --     Estimated Creatinine Clearance: 82 mL/min (A) (by C-G formula based on SCr of 0.32 mg/dL (L)).   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  Per his chart from [place] , he fell off two storyhouse roof while cleaning his brother's gutters and sustained compression fracture of L3, L4 and L5 and fracture of right transverse process at L3 and nondisplaced fracture of left glenoid rim many years ago. He had multiple imaging in [place]  including Lumbar MRI  in 2016 which showed moderate to severe spinal canal stenos   Delirium tremens (HCC) 09/01/2017   Depression    Dry eye 11/23/2016   Foreign body in middle portion of esophagus 05/19/2022   Hiatal hernia 09/14/2016   Status Collis gastroplasty and Nissen's fundoplication on 04/29/2015 in [place]    History of delirium tremons 07/22/2020   DTs during 10/2016 08/2017. And 12/2017 admissions    History of pulmonary embolus (PE) 11/02/2016   Unprovoked 11/01/2016. Xarelto  from 11/01/16 to 09/08/2017.   Hydrocele    Surgically corrected   Hypertension    Hypoalbuminemia due to protein-calorie malnutrition (HCC) 07/22/2020   Lumbar compression fracture (HCC)    Multiple rib fractures 07/22/2019   Left rib details XR: left ribs demonstrate multiple remote rib   Pancytopenia (HCC)    Rhabdomyolysis 07/22/2020   Skin cancer    exciced 2017   Tinea versicolor 06/08/2013   Tobacco use disorder 07/22/2020   Tooth infection 04/25/2018   Trigger finger, acquired 11/18/2012   Ulnar tunnel syndrome of right wrist 11/08/2012  Assessment: 33 y/oM with PMH of recurrent PE, paroxysmal afib on Apixaban , EtOH abuse, medication noncompliance who presented with delirium. Patient went into afib with RVR on 07/30/23. Patient has been intermittently refusing Apixaban  - last dose given 07/30/23 at 2215. Pharmacy consulted to transition anticoagulation to IV heparin  infusion. CBC: Hgb 11, Plt 204K. PT/INR 18.1/1.5, aPTT 30 seconds, heparin  level > 1.1 units/mL. Note that heparin  level is falsely elevated due to recent Apixaban .   Goal of Therapy:  Heparin  level 0.3-0.7 units/ml aPTT 66-102 seconds Monitor platelets by anticoagulation protocol: Yes   Plan:  Start heparin  infusion at 1200 units/hr (no initial bolus due to recent Apixaban ) Will monitor/titrate heparin  using aPTT until aPTT and heparin  level correlate Daily CBC, heparin  level, aPTT Monitor closely for s/sx of bleeding   Azalyn Sliwa,  PharmD, BCPS Clinical Pharmacist 07/31/2023,6:30pm

## 2023-07-31 NOTE — Plan of Care (Signed)
  Problem: Coping: Goal: Level of anxiety will decrease Outcome: Progressing   Problem: Elimination: Goal: Will not experience complications related to urinary retention Outcome: Progressing   Problem: Pain Management: Goal: General experience of comfort will improve Outcome: Progressing   Problem: Safety: Goal: Ability to remain free from injury will improve Outcome: Progressing   Problem: Skin Integrity: Goal: Risk for impaired skin integrity will decrease Outcome: Progressing   Problem: Safety: Goal: Violent Restraint(s) Outcome: Progressing   Problem: Safety: Goal: Non-violent Restraint(s) Outcome: Progressing

## 2023-07-31 NOTE — Progress Notes (Signed)
 PROGRESS NOTE    Anthony A Manchester Jr.  FMW:[id number] DOB: 12/19/1955 DOA: 07/28/2023 PCP: Pcp, No   Brief Narrative:  The patient is a 68 year old with history of severe alcohol  use with frequent withdrawal and delirium in the past, paroxysmal A-fib not on anticoagulation, HLD, HTN, recurrent PE, hiatal hernia, homelessness, and iron  deficiency comes to the hospital from a shelter due to delirium.  Continues to be confused there is concern for alcohol  withdrawal and encephalopathy workup is underway and patient went into A-fib with RVR overnight and is now on a diltiazem  drip.  Subsequently he also spiked a temperature and became tachycardic so we will need to work him up for infectious etiology and are checking blood cultures, urinalysis and urine culture and a chest x-ray.  Continues to be encephalopathic so we will discontinue IV lorazepam  and continue to monitor and may need further evaluation by Neurology.  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   -Currently on as needed Zyprexa  and Ativan .   -Ammonia and B12 are normal, TSH is as below.   -For now placed him on high-dose thiamine .   -CT of the head is negative.  Currently in restraints as he poses danger to self and others. -His confusion and encephalopathy today we will check an MRI and EEG; given that went into A-fib with RVR concern he may have had a stroke given his intermittent usage of Eliquis  -MRI done and showed -EEG done and showed This study is suggestive of mild diffuse encephalopathy. No seizures or epileptiform discharges were seen throughout the recording. -C/w CIWA Protocol but  discontinued IV lorazepam  given that he is extreme encephalopathic today and somnolent and drowsy -Have consulted psychiatry as he may require chronic medication and they feel that he does not meet inpatient hospitalization at this time but they have initiated Remeron  7.5 mg at bedtime for sleep and MDD and recommending continuing CIWA and Ativan  detox protocol and recommending continue IV thiamine  for 9 doses.  Patient has also not been initiated on a lorazepam  taper per psychiatry  Recurrent Pulmonary Embolism -Continue Apixaban  but will change to heparin  drip given that he is not taking very much oral intake  Fever -Unclear etiology but improving  -Will check blood cultures x 2, urinalysis and urine culture and chest x-ray -UA is negative and blood cultures pending -Chest x-ray done and showed Heart is mildly enlarged. No overt edema. Bibasilar opacities likely reflect atelectasis. No effusions or pneumothorax. No acute bony abnormality. -Treated with supportive care and will give him a bolus of normal saline 1 Liters yesterday and will give another 1 Liter -Initiated empiric antibiotics with IV Cefepime  and IV Vancomycin   Essential Hypertension -Continue with IV Hydralazine  10 mg Q4 as needed for systolic blood pressure greater than 180 -Currently on a diltiazem  drip for his A-fib with RVR -Continue to Monitor BP per Protocol -Last BP reading was 97/70  Paroxysmal Atrial Fibrillation now with RVR -Metoprolol  twice now stopped and placed on a diltiazem  drip.  IV as needed.  Already on Eliquis  but intermittent compliance with may need to go to heparin  drip -Recent ECHO done 3 weeks ago showed Hypokinesis of the inferior wall from the  base to the mid wall. Left ventricular ejection fraction, by estimation, is 45 to 50%. The left ventricle has mildly decreased function. The left ventricle has no regional wall motion abnormalities. Left ventricular diastolic parameters are indeterminate.  Right ventricular systolic function is mildly reduced. The right ventricular size is moderately enlarged. There is normal pulmonary artery  systolic pressure.  -Check Troponin I x2 -Given another 1 Liter bolus and started IVF with NS at 75 mL/hr -TSH was 2.967  Hypomagnesemia -Patient's Mag Level Trend: Recent Labs  Lab 07/07/23 1955 07/08/23 0422 07/09/23 0724 07/11/23 0730 07/12/23 0246 07/30/23 0403 07/31/23 1114  MG 1.3* 1.2* 1.5* 1.4* 2.1 1.4* 1.4*  -Replete with IV Mag Sulfate 4 grams again -Continue to Monitor and Replete as Necessary -Repeat Mag in the AM   Hypokalemia -Patient's K+ Level Trend: Recent Labs  Lab 07/11/23 0730 07/12/23 0246 07/12/23 1801 07/14/23 2321 07/27/23 1541 07/30/23 0403 07/31/23 1114  K 3.8 4.0 3.9 3.8 3.7 3.7 3.4*  -Replete with IV KCL 40 mEQ and po Kcl 40 mEQ -Continue to Monitor and Replete as Necessary -Repeat CMP in the AM   Hyponatremia -Na+ Trend: Recent Labs  Lab 07/11/23 0730 07/12/23 0246 07/12/23 1801 07/14/23 2321 07/27/23 1541 07/30/23 0403 07/31/23 1114  NA 137 135 136 140 141 136 127*  -C/w IVF with NS at 75 mL/hr for now and monitor  Metabolic Acidosis -Mild. CO2 is now 18, AG is 8, Chloride Level is 101 -Given IV fluid hydration with normal saline at 75 mL/hr -Continue to Monitor and Trend and repeat CMP in the AM  Hyperlipidemia -Hold Statin but resume when oral intake is better  Macrocytic Anemia -Hgb/Hct Trend: Recent Labs  Lab 07/10/23 0250 07/11/23 0730 07/12/23 1801 07/14/23 2321 07/27/23 1541 07/30/23 0403 07/31/23 0512  HGB 10.4* 9.7* 10.0* 9.0* 9.1* 9.9* 11.0*  HCT 32.9* 31.7* 32.3* 28.8* 29.4* 31.2* 33.9*  MCV 98.8 101.3* 102.2* 102.5* 105.4* 103.7* 100.9*  -Check Anemia Panel in the AM -Continue to Monitor for S/Sx of Bleeding; No overt bleeding noted -Repeat CBC in the AM   Hyperbilirubinemia -Bilirubin Trend: Recent Labs  Lab 07/08/23 0422 07/09/23 0724 07/11/23 0730  07/12/23 1801 07/14/23 2321 07/27/23 1541 07/31/23 1114  BILITOT 1.6* 1.0 0.8 0.6 0.7 0.8 1.3*  -Continue to Monitor and Trend and repeat CMP In the AM  Hypoalbuminemia -Patient's Albumin  Trend: Recent Labs  Lab 07/08/23 0422 07/09/23 0724 07/11/23 0730 07/12/23 1801 07/14/23 2321 07/27/23 1541 07/31/23 1114  ALBUMIN  3.0* 2.6* 2.8* 3.0* 3.1* 3.6 3.1*  -Continue to Monitor and Trend and repeat CMP in the AM   DVT prophylaxis: SCDs Start: 07/28/23 1549    Code Status: Full Code Family Communication: No family currently at bedside  Disposition Plan:  Level of care: Progressive Status is: Inpatient Remains inpatient appropriate because: Continues to be persistently encephalopathic and needs further clinical improvement   Consultants:  None  Procedures:  As delineated as above  Antimicrobials:  Anti-infectives (From admission, onward)    Start     Dose/Rate Route Frequency Ordered Stop   07/31/23 1000  vancomycin  (VANCOCIN ) 750 mg in sodium chloride  0.9 % 250 mL IVPB        750 mg 250 mL/hr over 60 Minutes Intravenous Every 12 hours 07/30/23 2034     07/30/23 2100  vancomycin  (VANCOREADY) IVPB 1250 mg/250 mL        1,250 mg 166.7 mL/hr over 90 Minutes Intravenous  Once 07/30/23 2013 07/31/23 0700   07/30/23 2030  ceFEPIme  (MAXIPIME ) 2 g in sodium chloride  0.9 % 100 mL IVPB        2 g 200 mL/hr over 30 Minutes Intravenous Every 8 hours 07/30/23 2013         Subjective: Seen and examined at bedside and continues to be encephalopathic.  Lethargic and unable to provide a subjective history.  Continues to be extremely confused.  No family at bedside.  No other concerns or points this time.  Objective: Vitals:   07/31/23 0800 07/31/23 0925 07/31/23 1200 07/31/23 1600  BP: (!) 140/90 126/73 123/74 97/70  Pulse: (!) 114 (!) 103 93 93  Resp: (!) 25  (!) 25 (!) 25  Temp: 98.9 F (37.2 C)  99.3 F (37.4 C) 98.6 F (37 C)  TempSrc: Axillary  Axillary Axillary  SpO2:  97%  94% 94%  Weight:      Height:        Intake/Output Summary (Last 24 hours) at 07/31/2023 1928 Last data filed at 07/31/2023 0830 Gross per 24 hour  Intake 1449.88 ml  Output 2050 ml  Net -600.12 ml   Filed Weights   07/28/23 2159  Weight: 64.7 kg   Examination: Physical Exam:  Constitutional: Chronically ill-appearing 68 year old older appearing than his stated age male who remains extremely confused and encephalopathic Respiratory: Diminished to auscultation bilaterally, no wheezing, rales, rhonchi or crackles.  Unlabored breathing Cardiovascular: Irregularly irregular, no murmurs / rubs / gallops. S1 and S2 auscultated. No extremity edema. 2+ pedal pulses. No carotid bruits.  Abdomen: Soft, non-tender, non-distended. Bowel sounds positive.  GU: Deferred. Musculoskeletal: No clubbing / cyanosis of digits/nails. No joint deformity upper and lower extremities. Skin: No rashes, lesions, ulcers on limited skin evaluation. No induration; Warm and dry.  Neurologic: Remains confused and encephalopathic Psychiatric: Impaired judgment and insight and he is not fully alert  Data Reviewed: I have personally reviewed following labs and imaging studies  CBC: Recent Labs  Lab 07/27/23 1541 07/30/23 0403 07/31/23 0512  WBC 3.1* 4.5 8.5  HGB 9.1* 9.9* 11.0*  HCT 29.4* 31.2* 33.9*  MCV 105.4* 103.7* 100.9*  PLT 217 231 204   Basic Metabolic Panel: Recent Labs  Lab 07/27/23 1541 07/30/23 0403 07/31/23 1114  NA 141 136 127*  K 3.7 3.7 3.4*  CL 109 105 101  CO2 22 22 18*  GLUCOSE 79 84 109*  BUN 16 15 11   CREATININE 0.55* 0.65 0.32*  CALCIUM  8.9 8.5* 8.4*  MG  --  1.4* 1.4*  PHOS  --  3.7 3.0   GFR: Estimated Creatinine Clearance: 82 mL/min (A) (by C-G formula based on SCr of 0.32 mg/dL (L)). Liver Function Tests: Recent Labs  Lab 07/27/23 1541 07/31/23 1114  AST 26 25  ALT 17 13  ALKPHOS 86 80  BILITOT 0.8 1.3*  PROT 7.2 7.0  ALBUMIN  3.6 3.1*   No results  for input(s): LIPASE, AMYLASE in the last 168 hours. Recent Labs  Lab 07/29/23 0859  AMMONIA 12   Coagulation Profile: Recent Labs  Lab 07/31/23 1816  INR 1.5*   Cardiac Enzymes: No results for input(s): CKTOTAL, CKMB, CKMBINDEX, TROPONINI in the last 168 hours. BNP (last 3 results) No results for input(s): PROBNP in the last 8760 hours. HbA1C: No results for input(s): HGBA1C in the last 72 hours. CBG: Recent Labs  Lab 07/27/23 1556  GLUCAP 74   Lipid Profile: No results for input(s): CHOL, HDL, LDLCALC, TRIG, CHOLHDL, LDLDIRECT in the last 72 hours. Thyroid Function Tests: Recent Labs  07/29/23 0859  TSH 2.967   Anemia Panel: Recent Labs    07/29/23 0859  VITAMINB12 303   Sepsis Labs: No results for input(s): PROCALCITON, LATICACIDVEN in the last 168 hours.  Recent Results (from the past 240 hours)  Culture, blood (Routine X 2) w Reflex to ID Panel     Status: None (Preliminary result)   Collection Time: 07/30/23  8:07 PM   Specimen: BLOOD RIGHT ARM  Result Value Ref Range Status   Specimen Description   Final    BLOOD RIGHT ARM AEROBIC BOTTLE ONLY Performed at [facility], [address], [place], [place] [address]    Special Requests   Final    Blood Culture results may not be optimal due to an inadequate volume of blood received in culture bottles BOTTLES DRAWN AEROBIC ONLY Performed at [facility], [address], [place], [place] [address]    Culture   Final    NO GROWTH < 12 HOURS Performed at [facility], [address], [place], [place] [address]    Report Status PENDING  Incomplete  Culture, blood (Routine X 2) w Reflex to ID Panel     Status: None (Preliminary result)   Collection Time: 07/30/23  8:07 PM   Specimen: BLOOD RIGHT ARM  Result Value Ref Range Status   Specimen Description   Final    BLOOD RIGHT ARM AEROBIC BOTTLE ONLY Performed at [facility], [address], [place], [place] [address]    Special Requests   Final    Blood Culture results may not be optimal due to an inadequate volume of blood received in culture bottles BOTTLES DRAWN AEROBIC ONLY Performed at [facility], [address], [place], [place] [address]    Culture   Final    NO GROWTH < 12 HOURS Performed at [facility], [address], [place], [place] [address]    Report Status PENDING  Incomplete    Radiology Studies: DG CHEST PORT 1 VIEW Result Date: 07/31/2023 CLINICAL DATA:  Fever, decreased O2 sats EXAM: PORTABLE CHEST 1 VIEW COMPARISON:  07/27/2023 FINDINGS: Heart is mildly enlarged. No overt edema. Bibasilar opacities likely reflect atelectasis. No effusions or pneumothorax. No acute bony abnormality. IMPRESSION: Cardiomegaly. Bibasilar atelectasis. Electronically Signed   By: Franky Crease M.D.   On: 07/31/2023 08:45   MR BRAIN WO CONTRAST Result Date: 07/30/2023 CLINICAL DATA:  Altered mental status EXAM: MRI HEAD WITHOUT CONTRAST TECHNIQUE: Multiplanar, multiecho pulse sequences of the brain and surrounding structures were obtained without intravenous contrast. COMPARISON:  None Available. FINDINGS: Evaluation is limited by motion artifact. The exam could not be completed due to the patient's inability to cooperate. Brain: No restricted diffusion to suggest acute or subacute infarct. No acute hemorrhage, mass, mass effect, or midline shift. No hydrocephalus or significant extra-axial collection. Vascular: Normal arterial flow voids. Skull and upper cervical spine: Normal marrow signal. Sinuses/Orbits: Clear paranasal sinuses. No acute finding in the orbits. Status post bilateral lens replacements. Other: Trace fluid in the mastoid air cells. IMPRESSION: Evaluation is limited by motion artifact and the patient's inability to complete the exam. Within this limitation, no acute intracranial process. No evidence of acute or  subacute infarct. Electronically Signed   By: Donald Campion M.D.   On: 07/30/2023 19:36   DG Abd 1 View Result Date: 07/30/2023 CLINICAL DATA:  [id number] Abdominal discomfort [id number] EXAM: ABDOMEN - 1 VIEW COMPARISON:  X-ray abdomen  12/02/2022, CT abdomen pelvis 02/03/2023 FINDINGS: Bilateral upper abdomen/hemidiaphragms are collimated off view. Surgical clips overlie the left upper abdomen. The bowel gas pattern is normal. Limited evaluation for free gas on this supine radiograph. No radio-opaque calculi or other significant radiographic abnormality are seen. Vascular calcifications. IMPRESSION: Nonobstructive bowel gas pattern. Bilateral upper abdomen/hemidiaphragms are collimated off view. Electronically Signed   By: Morgane  Naveau M.D.   On: 07/30/2023 16:30   EEG adult Result Date: 07/30/2023 Shelton Arlin KIDD, MD     07/30/2023  1:41 PM Patient Name: Anthony A Brawn Jr. MRN: [id number] Epilepsy Attending: Arlin KIDD Shelton Referring Physician/Provider: Sherrill Alejandro Donovan, DO Date: 07/30/2023 Duration: 25.44 mins Patient history: 68yo M with ams getting eeg to evaluate for seizure Level of alertness: Awake AEDs during EEG study: Ativan  Technical aspects: This EEG study was done with scalp electrodes positioned according to the 10-20 International system of electrode placement. Electrical activity was reviewed with band pass filter of 1-70Hz , sensitivity of 7 uV/mm, display speed of 59mm/sec with a 60Hz  notched filter applied as appropriate. EEG data were recorded continuously and digitally stored.  Video monitoring was available and reviewed as appropriate. Description: The posterior dominant rhythm consists of 7.5 Hz activity of moderate voltage (25-35 uV) seen predominantly in posterior head regions, symmetric and reactive to eye opening and eye closing. EEG showed intermittent generalized 5 to 6 Hz theta slowing. Physiologic photic driving was not seen during photic stimulation.  \Hyperventilation was not  performed.   ABNORMALITY - Intermittent slow, generalized IMPRESSION: This study is suggestive of mild diffuse encephalopathy. No seizures or epileptiform discharges were seen throughout the recording. Priyanka O Yadav   Scheduled Meds:  feeding supplement  237 mL Oral BID BM   folic acid   1 mg Oral Daily   LORazepam   1 mg Oral QID   Followed by   LORazepam   1 mg Oral TID   Followed by   NOREEN ON 08/01/2023] LORazepam   1 mg Oral BID   Followed by   NOREEN ON 08/03/2023] LORazepam   1 mg Oral Daily   multivitamin with minerals  1 tablet Oral Daily   potassium chloride   40 mEq Oral Once   [START ON 08/07/2023] thiamine   100 mg Oral Daily   Or   [START ON 08/07/2023] thiamine   100 mg Intravenous Daily   Continuous Infusions:  sodium chloride      ceFEPime  (MAXIPIME ) IV 2 g (07/31/23 1502)   diltiazem  (CARDIZEM ) infusion 12.5 mg/hr (07/31/23 1341)   heparin      magnesium  sulfate bolus IVPB     potassium chloride      thiamine  (VITAMIN B1) injection 500 mg (07/31/23 1325)   vancomycin  750 mg (07/31/23 1143)    LOS: 2 days   Alejandro Sherrill, DO [facility] Available via Epic secure chat 7am-7pm After these hours, please refer to coverage provider listed on [email or web address] 07/31/2023, 7:28 PM

## 2023-07-31 NOTE — Progress Notes (Addendum)
 MD notified that patient too lethargic to take oral meds during this shift after giving 2 mg of IV Ativan per CIWA score.

## 2023-08-01 ENCOUNTER — Inpatient Hospital Stay (HOSPITAL_COMMUNITY): Payer: 59

## 2023-08-01 DIAGNOSIS — R410 Disorientation, unspecified: Secondary | ICD-10-CM | POA: Diagnosis not present

## 2023-08-01 DIAGNOSIS — F10931 Alcohol use, unspecified with withdrawal delirium: Secondary | ICD-10-CM | POA: Diagnosis not present

## 2023-08-01 DIAGNOSIS — R451 Restlessness and agitation: Secondary | ICD-10-CM | POA: Diagnosis not present

## 2023-08-01 LAB — COMPREHENSIVE METABOLIC PANEL
ALT: 12 U/L (ref 0–44)
AST: 26 U/L (ref 15–41)
Albumin: 2.8 g/dL — ABNORMAL LOW (ref 3.5–5.0)
Alkaline Phosphatase: 79 U/L (ref 38–126)
Anion gap: 9 (ref 5–15)
BUN: 12 mg/dL (ref 8–23)
CO2: 16 mmol/L — ABNORMAL LOW (ref 22–32)
Calcium: 8.3 mg/dL — ABNORMAL LOW (ref 8.9–10.3)
Chloride: 104 mmol/L (ref 98–111)
Creatinine, Ser: 0.52 mg/dL — ABNORMAL LOW (ref 0.61–1.24)
GFR, Estimated: >60 mL/min (ref >60)
Glucose, Bld: 100 mg/dL — ABNORMAL HIGH (ref 70–99)
Potassium: 4.2 mmol/L (ref 3.5–5.1)
Sodium: 129 mmol/L — ABNORMAL LOW (ref 135–145)
Total Bilirubin: 1.5 mg/dL — ABNORMAL HIGH (ref 0.0–1.2)
Total Protein: 6.6 g/dL (ref 6.5–8.1)

## 2023-08-01 LAB — CBC WITH DIFFERENTIAL/PLATELET
Abs Immature Granulocytes: 0.03 10*3/uL (ref 0.00–0.07)
Basophils Absolute: 0 10*3/uL (ref 0.0–0.1)
Basophils Relative: 0 %
Eosinophils Absolute: 0 10*3/uL (ref 0.0–0.5)
Eosinophils Relative: 0 %
HCT: 30.2 % — ABNORMAL LOW (ref 39.0–52.0)
Hemoglobin: 10 g/dL — ABNORMAL LOW (ref 13.0–17.0)
Immature Granulocytes: 0 %
Lymphocytes Relative: 13 %
Lymphs Abs: 0.9 10*3/uL (ref 0.7–4.0)
MCH: 32.5 pg (ref 26.0–34.0)
MCHC: 33.1 g/dL (ref 30.0–36.0)
MCV: 98.1 fL (ref 80.0–100.0)
Monocytes Absolute: 0.8 10*3/uL (ref 0.1–1.0)
Monocytes Relative: 11 %
Neutro Abs: 5.5 10*3/uL (ref 1.7–7.7)
Neutrophils Relative %: 76 %
Platelets: 211 10*3/uL (ref 150–400)
RBC: 3.08 MIL/uL — ABNORMAL LOW (ref 4.22–5.81)
RDW: 17.8 % — ABNORMAL HIGH (ref 11.5–15.5)
WBC: 7.4 10*3/uL (ref 4.0–10.5)
nRBC: 0 % (ref 0.0–0.2)

## 2023-08-01 LAB — APTT
aPTT: 61 s — ABNORMAL HIGH (ref 24–36)
aPTT: 83 s — ABNORMAL HIGH (ref 24–36)

## 2023-08-01 LAB — HEPARIN LEVEL (UNFRACTIONATED): Heparin Unfractionated: >1.1 [IU]/mL — ABNORMAL HIGH (ref 0.30–0.70)

## 2023-08-01 LAB — MAGNESIUM: Magnesium: 2.1 mg/dL (ref 1.7–2.4)

## 2023-08-01 LAB — PHOSPHORUS: Phosphorus: 2.5 mg/dL (ref 2.5–4.6)

## 2023-08-01 MED ORDER — PIPERACILLIN-TAZOBACTAM 3.375 G IVPB
3.3750 g | Freq: Three times a day (TID) | INTRAVENOUS | Status: DC
Start: 2023-08-01 — End: 2023-08-03
  Administered 2023-08-01 – 2023-08-03 (×7): 3.375 g via INTRAVENOUS
  Filled 2023-08-01 (×7): qty 50

## 2023-08-01 NOTE — Progress Notes (Signed)
 Patient awake and conversant but still confused. Food offered and consumed 70 % of dinner. No signs of aspiration noted.

## 2023-08-01 NOTE — Progress Notes (Signed)
 Pharmacy Antibiotic Note  Anthony [name]. is a 68 y.o. male admitted on 07/28/2023 with delirium, encephalopathy.  Pharmacy consulted for Vancomycin  and Cefepime  dosing on 1/10 for sepsis. Today, Pharmacy has been consulted to broaden to Zosyn  dosing for sepsis.  Plan: Discontinue cefepime  Continue vancomycin  750 mg IV q12h Begin Zosyn  3.375g IV q8h (4 hour infusion). Monitor clinical progress, renal function, vancomycin  levels as indicated F/U daily SCr x 3, C&S, abx deescalation / LOT   Height: 5' 8 (172.7 cm) Weight: 64.7 kg (142 lb 10.2 oz) IBW/kg (Calculated) : 68.4  Temp (24hrs), Avg:98.4 F (36.9 C), Min:97.8 F (36.6 C), Tracker:99.8 F (37.7 C)  Recent Labs  Lab 07/27/23 1541 07/30/23 0403 07/31/23 0512 07/31/23 1114 08/01/23 0301  WBC 3.1* 4.5 8.5  --  7.4  CREATININE 0.55* 0.65  --  0.32* 0.52*    Estimated Creatinine Clearance: 82 mL/min (A) (by C-G formula based on SCr of 0.52 mg/dL (L)).    Allergies  Allergen Reactions   Other Itching and Other (See Comments)    Seasonal allergies- Itchy eyes, runny nose, congestion   Poison Ivy Extract Rash    Antimicrobials this admission: 1/10 cefepime  >> 1/12 1/10 vancomycin  >>  1/12 Zosyn  >>  Microbiology results: 1/10 BCx: ngx1   Thank you for allowing pharmacy to be a part of this patient's care.  Eleanor Agent, PharmD, BCPS 08/01/2023 3:49 PM

## 2023-08-01 NOTE — Plan of Care (Signed)
  Problem: Coping: Goal: Level of anxiety will decrease Outcome: Progressing   Problem: Elimination: Goal: Will not experience complications related to urinary retention Outcome: Progressing   Problem: Safety: Goal: Ability to remain free from injury will improve Outcome: Progressing   Problem: Skin Integrity: Goal: Risk for impaired skin integrity will decrease Outcome: Progressing   

## 2023-08-01 NOTE — Progress Notes (Signed)
 PROGRESS NOTE    Anthony A Sustaita Jr.  FMW:[id number] DOB: Apr 10, 1956 DOA: 07/28/2023 PCP: Pcp, No   Brief Narrative:  The patient is a 68 year old with history of severe alcohol  use with frequent withdrawal and delirium in the past, paroxysmal A-fib not on anticoagulation, HLD, HTN, recurrent PE, hiatal hernia, homelessness, and iron  deficiency comes to the hospital from a shelter due to delirium.  Continues to be confused there is concern for alcohol  withdrawal and encephalopathy workup is underway and patient went into A-fib with RVR overnight and is now on a diltiazem  drip.  Subsequently he also spiked a temperature and became tachycardic so we will need to work him up for infectious etiology and are checking blood cultures, urinalysis and urine culture and a chest x-ray.  Continues to be encephalopathic but was Easler bit more awake but more confused today.  So we will discontinue IV lorazepam .  We discussed with neurology who recommends if the patient does not improve tomorrow we will get a LP but Neurology recommends changing the cefepime  to Zosyn  given his encephalopathy.  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   -Currently on as needed Zyprexa  and Ativan .   -Ammonia and B12 are normal, TSH is as below.   -For now placed him on high-dose thiamine .   -CT of the head is negative.  Currently in restraints as he poses danger to self and others. -His confusion and encephalopathy today we will check an MRI and EEG; given that went into A-fib with RVR concern he may have had a stroke given his intermittent usage of Eliquis  -MRI done and showed -EEG  done and showed This study is suggestive of mild diffuse encephalopathy. No seizures or epileptiform discharges were seen throughout the recording. -C/w CIWA Protocol but discontinued IV lorazepam  given that he is extreme encephalopathic today and somnolent and drowsy -Have consulted psychiatry as he may require chronic medication and they feel that he does not meet inpatient hospitalization at this time but they have initiated Remeron  7.5 mg at bedtime for sleep and MDD and recommending continuing CIWA and Ativan  detox protocol and recommending continue IV thiamine  for 9 doses.  Patient has also not been initiated on a lorazepam  taper per psychiatry -Continues to be encephalopathic so discussed with neurology who recommends that if he continues to be encephalopathic obtain an LP in the morning but recommends changing IV cefepime  to IV Zosyn   Recurrent Pulmonary Embolism -Continue Apixaban  but will change to heparin  drip given that he is not taking very much oral intake  Fever, improving  -Unclear etiology but improving  -Will check blood cultures x 2, urinalysis and urine culture and chest x-ray -UA is negative and blood cultures pending -Chest x-ray done and showed Heart is mildly enlarged. No overt edema. Bibasilar opacities likely reflect atelectasis. No effusions or pneumothorax. No acute bony abnormality. -Treated with supportive care and will give him a bolus of normal saline 1 Liters yesterday and will give another 1 Liter and now on IVF with NS at 75 mL/hr -Initiated empiric antibiotics with IV Cefepime  and IV Vancomycin  will change IV cefepime  to IV Zosyn  given his continued encephalopathy -Repeat CXR in the AM done and  showed Ongoing low lung volumes with veiling right lung base opacity which is probably unresolved right pleural effusion seen in December. No other convincing acute cardiopulmonary abnormality.  Essential Hypertension -Continue with IV Hydralazine  10 mg Q4 as needed  for systolic blood pressure greater than 180 -Currently on a diltiazem  drip for his A-fib with RVR -Continue to Monitor BP per Protocol -Last BP reading was 128/78  Paroxysmal Atrial Fibrillation now with RVR -Metoprolol  twice now stopped and placed on a diltiazem  drip.  IV as needed.  Already on Eliquis  but intermittent compliance with may need to go to heparin  drip -Recent ECHO done 3 weeks ago showed Hypokinesis of the inferior wall from the base to the mid wall. Left ventricular ejection fraction, by estimation, is 45 to 50%. The left ventricle has mildly decreased function. The left ventricle has no regional wall motion abnormalities. Left ventricular diastolic parameters are indeterminate. Right ventricular systolic function is mildly reduced. The right ventricular size is moderately enlarged. There is normal pulmonary artery  systolic pressure.  -Check Troponin I x2 -C/w IVF Hydration with NS at 75 mL/hr -TSH was 2.967  Hypomagnesemia -Patient's Mag Level Trend: Recent Labs  Lab 07/08/23 0422 07/09/23 0724 07/11/23 0730 07/12/23 0246 07/30/23 0403 07/31/23 1114 08/01/23 0301  MG 1.2* 1.5* 1.4* 2.1 1.4* 1.4* 2.1  -Replete with IV Mag Sulfate 4 grams again -Continue to Monitor and Replete as Necessary -Repeat Mag in the AM   Hypokalemia -Patient's K+ Level Trend: Recent Labs  Lab 07/12/23 0246 07/12/23 1801 07/14/23 2321 07/27/23 1541 07/30/23 0403 07/31/23 1114 08/01/23 0301  K 4.0 3.9 3.8 3.7 3.7 3.4* 4.2  -Continue to Monitor and Replete as Necessary -Repeat CMP in the AM   Hyponatremia -Na+ Trend: Recent Labs  Lab 07/12/23 0246 07/12/23 1801 07/14/23 2321 07/27/23 1541 07/30/23 0403 07/31/23 1114 08/01/23 0301  NA 135 136 140 141 136 127* 129*  -C/w IVF with NS at 75 mL/hr for now and monitor  Metabolic Acidosis -Mild and worsening. CO2 is now 16, AG is 9, Chloride Level is 104 -Given IV fluid hydration with normal saline at 75 mL/hr and will  continue for now -Continue to Monitor and Trend and repeat CMP in the AM  Hyperlipidemia -Hold Statin but resume when oral intake is better  Macrocytic Anemia -Hgb/Hct Trend: Recent Labs  Lab 07/11/23 0730 07/12/23 1801 07/14/23 2321 07/27/23 1541 07/30/23 0403 07/31/23 0512 08/01/23 0301  HGB 9.7* 10.0* 9.0* 9.1* 9.9* 11.0* 10.0*  HCT 31.7* 32.3* 28.8* 29.4* 31.2* 33.9* 30.2*  MCV 101.3* 102.2* 102.5* 105.4* 103.7* 100.9* 98.1  -Check Anemia Panel in the AM -Continue to Monitor for S/Sx of Bleeding; No overt bleeding noted -Repeat CBC in the AM   Hyperbilirubinemia, mildly worsening  -Bilirubin Trend: Recent Labs  Lab 07/09/23 0724 07/11/23 0730 07/12/23 1801 07/14/23 2321 07/27/23 1541 07/31/23 1114 08/01/23 0301  BILITOT 1.0 0.8 0.6 0.7 0.8 1.3* 1.5*  -Continue to Monitor and Trend and repeat CMP In the AM  Hypoalbuminemia -Patient's Albumin  Trend: Recent Labs  Lab 07/09/23 0724 07/11/23 0730 07/12/23 1801 07/14/23 2321 07/27/23 1541 07/31/23 1114 08/01/23 0301  ALBUMIN  2.6* 2.8* 3.0* 3.1* 3.6 3.1* 2.8*  -Continue to Monitor and Trend and repeat CMP in the AM   DVT prophylaxis: SCDs Start: 07/28/23 1549    Code Status: Full Code Family Communication: No family currently at bedside  Disposition Plan:  Level of care: Progressive Status is: Inpatient Remains inpatient appropriate because: Continues to be encephalopathic and will  need further clinical improvement prior to discharge starting   Consultants:  Discussed with Neurology  Procedures:  As delineated as above  Antimicrobials:  Anti-infectives (From admission, onward)    Start     Dose/Rate Route Frequency Ordered Stop   08/01/23 1630  piperacillin -tazobactam (ZOSYN ) IVPB 3.375 g        3.375 g 12.5 mL/hr over 240 Minutes Intravenous Every 8 hours 08/01/23 1537     07/31/23 1000  vancomycin  (VANCOCIN ) 750 mg in sodium chloride  0.9 % 250 mL IVPB        750 mg 250 mL/hr over 60 Minutes  Intravenous Every 12 hours 07/30/23 2034     07/30/23 2100  vancomycin  (VANCOREADY) IVPB 1250 mg/250 mL        1,250 mg 166.7 mL/hr over 90 Minutes Intravenous  Once 07/30/23 2013 07/31/23 0700   07/30/23 2030  ceFEPIme  (MAXIPIME ) 2 g in sodium chloride  0.9 % 100 mL IVPB  Status:  Discontinued        2 g 200 mL/hr over 30 Minutes Intravenous Every 8 hours 07/30/23 2013 08/01/23 1533       Subjective: Seen and examined at bedside and continues be encephalopathic but Schall bit more easily arousable and awake.  Continues to be confused and mumbles quite a bit.  No other concerns or complaints this time.  Objective: Vitals:   08/01/23 1000 08/01/23 1036 08/01/23 1228 08/01/23 1321  BP:  114/83 97/72 128/78  Pulse:  92 79 81  Resp: (!) 23     Temp:  97.9 F (36.6 C) 97.8 F (36.6 C)   TempSrc:  Axillary Oral   SpO2:  100% 97%   Weight:      Height:        Intake/Output Summary (Last 24 hours) at 08/01/2023 2020 Last data filed at 08/01/2023 1603 Gross per 24 hour  Intake 2099.09 ml  Output 1700 ml  Net 399.09 ml   Filed Weights   07/28/23 2159  Weight: 64.7 kg   Examination: Physical Exam:  Constitutional: Thin chronically ill-appearing Caucasian male who appears confused to Engram bit more arousable Respiratory: Diminished to auscultation bilaterally, no wheezing, rales, rhonchi or crackles. Normal respiratory effort and patient is not tachypenic. No accessory muscle use.  Unlabored breathing Cardiovascular: Fairly regular slightly tachycardic.  No peripheral extremity edema.  Abdomen: Soft, non-tender, non-distended. Bowel sounds positive.  GU: Deferred. Musculoskeletal: No clubbing / cyanosis of digits/nails. No joint deformity upper and lower extremities.  Skin: No rashes, lesions, ulcers limited skin evaluation. No induration; Warm and dry.  Neurologic: CN 2-12 grossly intact with no focal deficits. Romberg sign and cerebellar reflexes not assessed.  Psychiatric:  Normal judgment and insight. Alert and oriented x 3. Normal mood and appropriate affect.   Data Reviewed: I have personally reviewed following labs and imaging studies  CBC: Recent Labs  Lab 07/27/23 1541 07/30/23 0403 07/31/23 0512 08/01/23 0301  WBC 3.1* 4.5 8.5 7.4  NEUTROABS  --   --   --  5.5  HGB 9.1* 9.9* 11.0* 10.0*  HCT 29.4* 31.2* 33.9* 30.2*  MCV 105.4* 103.7* 100.9* 98.1  PLT 217 231 204 211   Basic Metabolic Panel: Recent Labs  Lab 07/27/23 1541 07/30/23 0403 07/31/23 1114 08/01/23 0301  NA 141 136 127* 129*  K 3.7 3.7 3.4* 4.2  CL 109 105 101 104  CO2 22 22 18* 16*  GLUCOSE 79 84 109* 100*  BUN 16 15 11 12   CREATININE 0.55* 0.65 0.32*  0.52*  CALCIUM  8.9 8.5* 8.4* 8.3*  MG  --  1.4* 1.4* 2.1  PHOS  --  3.7 3.0 2.5   GFR: Estimated Creatinine Clearance: 82 mL/min (A) (by C-G formula based on SCr of 0.52 mg/dL (L)). Liver Function Tests: Recent Labs  Lab 07/27/23 1541 07/31/23 1114 08/01/23 0301  AST 26 25 26   ALT 17 13 12   ALKPHOS 86 80 79  BILITOT 0.8 1.3* 1.5*  PROT 7.2 7.0 6.6  ALBUMIN  3.6 3.1* 2.8*   No results for input(s): LIPASE, AMYLASE in the last 168 hours. Recent Labs  Lab 07/29/23 0859  AMMONIA 12   Coagulation Profile: Recent Labs  Lab 07/31/23 1816  INR 1.5*   Cardiac Enzymes: No results for input(s): CKTOTAL, CKMB, CKMBINDEX, TROPONINI in the last 168 hours. BNP (last 3 results) No results for input(s): PROBNP in the last 8760 hours. HbA1C: No results for input(s): HGBA1C in the last 72 hours. CBG: Recent Labs  Lab 07/27/23 1556  GLUCAP 74   Lipid Profile: No results for input(s): CHOL, HDL, LDLCALC, TRIG, CHOLHDL, LDLDIRECT in the last 72 hours. Thyroid Function Tests: No results for input(s): TSH, T4TOTAL, FREET4, T3FREE, THYROIDAB in the last 72 hours. Anemia Panel: No results for input(s): VITAMINB12, FOLATE, FERRITIN, TIBC, IRON , RETICCTPCT in the last 72  hours. Sepsis Labs: No results for input(s): PROCALCITON, LATICACIDVEN in the last 168 hours.  Recent Results (from the past 240 hours)  Culture, blood (Routine X 2) w Reflex to ID Panel     Status: None (Preliminary result)   Collection Time: 07/30/23  8:07 PM   Specimen: BLOOD RIGHT ARM  Result Value Ref Range Status   Specimen Description   Final    BLOOD RIGHT ARM AEROBIC BOTTLE ONLY Performed at [facility], [address], [place], [place] [address]    Special Requests   Final    Blood Culture results may not be optimal due to an inadequate volume of blood received in culture bottles BOTTLES DRAWN AEROBIC ONLY Performed at [facility], [address], [place], [place] [address]    Culture   Final    NO GROWTH 1 DAY Performed at [facility], [address] [place], [place] [address]    Report Status PENDING  Incomplete  Culture, blood (Routine X 2) w Reflex to ID Panel     Status: None (Preliminary result)   Collection Time: 07/30/23  8:07 PM   Specimen: BLOOD RIGHT ARM  Result Value Ref Range Status   Specimen Description   Final    BLOOD RIGHT ARM AEROBIC BOTTLE ONLY Performed at [facility], [address] [place], [place] [address]    Special Requests   Final    Blood Culture results may not be optimal due to an inadequate volume of blood received in culture bottles BOTTLES DRAWN AEROBIC ONLY Performed at [facility], [address] [place], [place] [address]    Culture   Final    NO GROWTH 1 DAY Performed at [facility], [address], [place], [place] [address]    Report Status PENDING  Incomplete    Radiology Studies: DG CHEST PORT 1 VIEW Result Date: 08/01/2023 CLINICAL DATA:  68 year old male with shortness of breath. Status post pulmonary embolus in December. EXAM: PORTABLE CHEST 1 VIEW COMPARISON:  Portable chest yesterday and earlier. FINDINGS: Portable AP  semi upright view at 0534 hours. Continued low lung volumes. Mediastinal  contours remain normal. No pneumothorax. Mild new veiling opacity at the right lung base. Otherwise stable lung markings, no consolidation or confluent opacity. No convincing acute pulmonary edema. Right pleural effusion by CTA in December. Left upper quadrant surgical clips. Paucity of upper abdominal bowel gas. Stable visualized osseous structures. IMPRESSION: Ongoing low lung volumes with veiling right lung base opacity which is probably unresolved right pleural effusion seen in December. No other convincing acute cardiopulmonary abnormality. Electronically Signed   By: VEAR Hurst M.D.   On: 08/01/2023 08:52   DG CHEST PORT 1 VIEW Result Date: 07/31/2023 CLINICAL DATA:  Fever, decreased O2 sats EXAM: PORTABLE CHEST 1 VIEW COMPARISON:  07/27/2023 FINDINGS: Heart is mildly enlarged. No overt edema. Bibasilar opacities likely reflect atelectasis. No effusions or pneumothorax. No acute bony abnormality. IMPRESSION: Cardiomegaly. Bibasilar atelectasis. Electronically Signed   By: Franky Crease M.D.   On: 07/31/2023 08:45   Scheduled Meds:  feeding supplement  237 mL Oral BID BM   folic acid   1 mg Oral Daily   LORazepam   1 mg Oral TID   Followed by   LORazepam   1 mg Oral BID   Followed by   NOREEN ON 08/03/2023] LORazepam   1 mg Oral Daily   multivitamin with minerals  1 tablet Oral Daily   potassium chloride   40 mEq Oral Once   [START ON 08/07/2023] thiamine   100 mg Oral Daily   Or   [START ON 08/07/2023] thiamine   100 mg Intravenous Daily   Continuous Infusions:  diltiazem  (CARDIZEM ) infusion 12.5 mg/hr (08/01/23 1119)   heparin  1,200 Units/hr (08/01/23 1520)   piperacillin -tazobactam (ZOSYN )  IV 3.375 g (08/01/23 1555)   thiamine  (VITAMIN B1) injection 500 mg (08/01/23 0954)   vancomycin  750 mg (08/01/23 2003)    LOS: 3 days   Alejandro Marker, DO [facility] Available via Epic secure chat 7am-7pm After these hours,  please refer to coverage provider listed on [email or web address] 08/01/2023, 8:20 PM

## 2023-08-01 NOTE — Progress Notes (Signed)
 PHARMACY - ANTICOAGULATION CONSULT NOTE  Pharmacy Consult for IV heparin  Indication: atrial fibrillation, recurrent PE  Allergies  Allergen Reactions   Other Itching and Other (See Comments)    Seasonal allergies- Itchy eyes, runny nose, congestion   Poison Ivy Extract Rash    Patient Measurements: Height: 5' 8 (172.7 cm) Weight: 64.7 kg (142 lb 10.2 oz) IBW/kg (Calculated) : 68.4 Heparin  Dosing Weight: total body weight  Vital Signs: Temp: 99.8 F (37.7 C) (01/11 2104) Temp Source: Axillary (01/11 1600) BP: 99/65 (01/11 2104) Pulse Rate: 88 (01/11 2104)  Labs: Recent Labs    07/30/23 0403 07/30/23 2007 07/30/23 2126 07/31/23 0512 07/31/23 1114 07/31/23 1816 08/01/23 0301  HGB 9.9*  --   --  11.0*  --   --  10.0*  HCT 31.2*  --   --  33.9*  --   --  30.2*  PLT 231  --   --  204  --   --  211  APTT  --   --   --   --   --  30 61*  LABPROT  --   --   --   --   --  18.1*  --   INR  --   --   --   --   --  1.5*  --   HEPARINUNFRC  --   --   --   --   --  >1.10* >1.10*  CREATININE 0.65  --   --   --  0.32*  --   --   TROPONINIHS  --  8 8  --   --   --   --     Estimated Creatinine Clearance: 82 mL/min (A) (by C-G formula based on SCr of 0.32 mg/dL (L)).   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  Per his chart from [place] , he fell off two storyhouse roof while cleaning his brother's gutters and sustained compression fracture of L3, L4 and L5 and fracture of right transverse process at L3 and nondisplaced fracture of left glenoid rim many years  ago. 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 And 12/2017 admissions    History of pulmonary embolus (PE) 11/02/2016   Unprovoked 11/01/2016. Xarelto  from 11/01/16 to 09/08/2017.   Hydrocele    Surgically corrected   Hypertension    Hypoalbuminemia due to protein-calorie malnutrition (HCC) 07/22/2020   Lumbar compression fracture (HCC)    Multiple rib fractures 07/22/2019   Left rib details XR: left ribs demonstrate multiple remote rib   Pancytopenia (HCC)    Rhabdomyolysis 07/22/2020   Skin cancer    exciced 2017   Tinea versicolor 06/08/2013   Tobacco use disorder 07/22/2020  Tooth infection 04/25/2018   Trigger finger, acquired 11/18/2012   Ulnar tunnel syndrome of right wrist 11/08/2012     Assessment: 55 y/oM with PMH of recurrent PE, paroxysmal afib on Apixaban , EtOH abuse, medication noncompliance who presented with delirium. Patient went into afib with RVR on 07/30/23. Patient has been intermittently refusing Apixaban  - last dose given 07/30/23 at 2215. Pharmacy consulted to transition anticoagulation to IV heparin  infusion. CBC: Hgb 11, Plt 204K. PT/INR 18.1/1.5, aPTT 30 seconds, heparin  level > 1.1 units/mL. Note that heparin  level is falsely elevated due to recent Apixaban .   08/01/2023 aPTT 61 therapeutic on 1200 units/hr HL elevated as expected with apixaban  on board Hgb 10, plts 211 Per RN no bleeding, except where pulled out IV earlier  Goal of Therapy:  Heparin  level 0.3-0.7 units/ml aPTT 66-102 seconds Monitor platelets by anticoagulation protocol: Yes   Plan:  Continue  heparin  drip at 1200 units/hr Confirmatory aptt in 6 hours Will monitor/titrate heparin  using aPTT until aPTT and heparin  level correlate Daily CBC, heparin  level, aPTT Monitor closely for s/sx of bleeding  Leeroy Mace RPh 08/01/2023, 3:57 AM

## 2023-08-01 NOTE — Progress Notes (Signed)
 PHARMACY - ANTICOAGULATION CONSULT NOTE  Pharmacy Consult for IV heparin  Indication: atrial fibrillation, recurrent PE  Allergies  Allergen Reactions   Other Itching and Other (See Comments)    Seasonal allergies- Itchy eyes, runny nose, congestion   Poison Ivy Extract Rash    Patient Measurements: Height: 5' 8 (172.7 cm) Weight: 64.7 kg (142 lb 10.2 oz) IBW/kg (Calculated) : 68.4 Heparin  Dosing Weight: total body weight  Vital Signs: Temp: 97.9 F (36.6 C) (01/12 1036) Temp Source: Axillary (01/12 1036) BP: 114/83 (01/12 1036) Pulse Rate: 92 (01/12 1036)  Labs: Recent Labs    07/30/23 0403 07/30/23 2007 07/30/23 2126 07/31/23 0512 07/31/23 1114 07/31/23 1816 08/01/23 0301 08/01/23 0851  HGB 9.9*  --   --  11.0*  --   --  10.0*  --   HCT 31.2*  --   --  33.9*  --   --  30.2*  --   PLT 231  --   --  204  --   --  211  --   APTT  --   --   --   --   --  30 61* 83*  LABPROT  --   --   --   --   --  18.1*  --   --   INR  --   --   --   --   --  1.5*  --   --   HEPARINUNFRC  --   --   --   --   --  >1.10* >1.10*  --   CREATININE 0.65  --   --   --  0.32*  --  0.52*  --   TROPONINIHS  --  8 8  --   --   --   --   --     Estimated Creatinine Clearance: 82 mL/min (A) (by C-G formula based on SCr of 0.52 mg/dL (L)).   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  Per his chart from [place] , he fell off two storyhouse roof while cleaning his brother's gutters and sustained compression fracture of L3, L4 and L5 and fracture of right transverse process at  L3 and nondisplaced fracture of left glenoid rim many years ago. 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 And 12/2017 admissions    History of pulmonary embolus (PE) 11/02/2016   Unprovoked 11/01/2016. Xarelto  from 11/01/16 to 09/08/2017.   Hydrocele    Surgically corrected   Hypertension    Hypoalbuminemia due to protein-calorie malnutrition (HCC) 07/22/2020   Lumbar compression fracture (HCC)    Multiple rib fractures 07/22/2019   Left rib details XR: left ribs demonstrate multiple remote rib   Pancytopenia (HCC)  Rhabdomyolysis 07/22/2020   Skin cancer    exciced 2017   Tinea versicolor 06/08/2013   Tobacco use disorder 07/22/2020   Tooth infection 04/25/2018   Trigger finger, acquired 11/18/2012   Ulnar tunnel syndrome of right wrist 11/08/2012     Assessment: 4 y/oM with PMH of recurrent PE, paroxysmal afib on Apixaban , EtOH abuse, medication noncompliance who presented with delirium. Patient went into afib with RVR on 07/30/23. Patient has been intermittently refusing Apixaban  - last dose given 07/30/23 at 2215. Pharmacy consulted to transition anticoagulation to IV heparin  infusion. CBC: Hgb 11, Plt 204K. PT/INR 18.1/1.5, aPTT 30 seconds, heparin  level > 1.1 units/mL. Note that heparin  level is falsely elevated due to recent Apixaban .   08/01/2023 aPTT 83 therapeutic on heparin  infusing at 1200 units/hr HL elevated as expected with apixaban  on board Hgb 10, plts 211 Per RN no bleeding  Goal of Therapy:  Heparin  level 0.3-0.7 units/ml aPTT 66-102 seconds Monitor platelets by  anticoagulation protocol: Yes   Plan:  Continue heparin  drip at 1200 units/hr Will monitor/titrate heparin  using aPTT until aPTT and heparin  level correlate Monitor daily aPTT & heparin  level, CBC, signs/symptoms of bleeding  Thank you for allowing pharmacy to be a part of this patient's care.  Eleanor EMERSON Agent, PharmD, BCPS Clinical Pharmacist [facility] 08/01/2023 10:46 AM

## 2023-08-02 DIAGNOSIS — F1024 Alcohol dependence with alcohol-induced mood disorder: Secondary | ICD-10-CM | POA: Diagnosis not present

## 2023-08-02 DIAGNOSIS — F10231 Alcohol dependence with withdrawal delirium: Secondary | ICD-10-CM

## 2023-08-02 DIAGNOSIS — F1014 Alcohol abuse with alcohol-induced mood disorder: Secondary | ICD-10-CM | POA: Diagnosis not present

## 2023-08-02 DIAGNOSIS — R451 Restlessness and agitation: Secondary | ICD-10-CM | POA: Diagnosis not present

## 2023-08-02 DIAGNOSIS — R410 Disorientation, unspecified: Secondary | ICD-10-CM | POA: Diagnosis not present

## 2023-08-02 LAB — RETICULOCYTES
Immature Retic Fract: 13.7 % (ref 2.3–15.9)
RBC.: 2.94 MIL/uL — ABNORMAL LOW (ref 4.22–5.81)
Retic Count, Absolute: 37 10*3/uL (ref 19.0–186.0)
Retic Ct Pct: 1.3 % (ref 0.4–3.1)

## 2023-08-02 LAB — CBC WITH DIFFERENTIAL/PLATELET
Abs Immature Granulocytes: 0.03 10*3/uL (ref 0.00–0.07)
Basophils Absolute: 0 10*3/uL (ref 0.0–0.1)
Basophils Relative: 1 %
Eosinophils Absolute: 0.2 10*3/uL (ref 0.0–0.5)
Eosinophils Relative: 3 %
HCT: 29.1 % — ABNORMAL LOW (ref 39.0–52.0)
Hemoglobin: 9.5 g/dL — ABNORMAL LOW (ref 13.0–17.0)
Immature Granulocytes: 1 %
Lymphocytes Relative: 19 %
Lymphs Abs: 1.1 10*3/uL (ref 0.7–4.0)
MCH: 32.4 pg (ref 26.0–34.0)
MCHC: 32.6 g/dL (ref 30.0–36.0)
MCV: 99.3 fL (ref 80.0–100.0)
Monocytes Absolute: 0.8 10*3/uL (ref 0.1–1.0)
Monocytes Relative: 13 %
Neutro Abs: 3.8 10*3/uL (ref 1.7–7.7)
Neutrophils Relative %: 63 %
Platelets: 213 10*3/uL (ref 150–400)
RBC: 2.93 MIL/uL — ABNORMAL LOW (ref 4.22–5.81)
RDW: 17.4 % — ABNORMAL HIGH (ref 11.5–15.5)
WBC: 5.9 10*3/uL (ref 4.0–10.5)
nRBC: 0 % (ref 0.0–0.2)

## 2023-08-02 LAB — COMPREHENSIVE METABOLIC PANEL
ALT: 12 U/L (ref 0–44)
AST: 21 U/L (ref 15–41)
Albumin: 2.8 g/dL — ABNORMAL LOW (ref 3.5–5.0)
Alkaline Phosphatase: 80 U/L (ref 38–126)
Anion gap: 6 (ref 5–15)
BUN: 11 mg/dL (ref 8–23)
CO2: 18 mmol/L — ABNORMAL LOW (ref 22–32)
Calcium: 8.9 mg/dL (ref 8.9–10.3)
Chloride: 111 mmol/L (ref 98–111)
Creatinine, Ser: 0.36 mg/dL — ABNORMAL LOW (ref 0.61–1.24)
GFR, Estimated: >60 mL/min (ref >60)
Glucose, Bld: 96 mg/dL (ref 70–99)
Potassium: 3.7 mmol/L (ref 3.5–5.1)
Sodium: 135 mmol/L (ref 135–145)
Total Bilirubin: 1.1 mg/dL (ref 0.0–1.2)
Total Protein: 6.6 g/dL (ref 6.5–8.1)

## 2023-08-02 LAB — FOLATE: Folate: 15 ng/mL (ref >5.9)

## 2023-08-02 LAB — PHOSPHORUS: Phosphorus: 2.7 mg/dL (ref 2.5–4.6)

## 2023-08-02 LAB — VITAMIN B12: Vitamin B-12: 473 pg/mL (ref 180–914)

## 2023-08-02 LAB — HEPARIN LEVEL (UNFRACTIONATED): Heparin Unfractionated: 0.62 [IU]/mL (ref 0.30–0.70)

## 2023-08-02 LAB — FERRITIN: Ferritin: 128 ng/mL (ref 24–336)

## 2023-08-02 LAB — IRON AND TIBC
Iron: 17 ug/dL — ABNORMAL LOW (ref 45–182)
Saturation Ratios: 5 % — ABNORMAL LOW (ref 17.9–39.5)
TIBC: 335 ug/dL (ref 250–450)
UIBC: 318 ug/dL

## 2023-08-02 LAB — APTT: aPTT: 85 s — ABNORMAL HIGH (ref 24–36)

## 2023-08-02 LAB — MAGNESIUM: Magnesium: 1.7 mg/dL (ref 1.7–2.4)

## 2023-08-02 MED ORDER — ENOXAPARIN SODIUM 80 MG/0.8ML IJ SOSY
65.0000 mg | PREFILLED_SYRINGE | Freq: Two times a day (BID) | INTRAMUSCULAR | Status: DC
  Administered 2023-08-02 – 2023-08-19 (×35): 65 mg via SUBCUTANEOUS
  Filled 2023-08-02 (×35): qty 0.8

## 2023-08-02 MED ORDER — POTASSIUM CHLORIDE 10 MEQ/100ML IV SOLN
10.0000 meq | INTRAVENOUS | Status: AC
  Administered 2023-08-02 (×4): 10 meq via INTRAVENOUS
  Filled 2023-08-02 (×4): qty 100

## 2023-08-02 MED ORDER — MAGNESIUM SULFATE 2 GM/50ML IV SOLN
2.0000 g | Freq: Once | INTRAVENOUS | Status: AC
  Administered 2023-08-02: 2 g via INTRAVENOUS
  Filled 2023-08-02: qty 50

## 2023-08-02 NOTE — Progress Notes (Addendum)
 SLP Cancellation Note  Patient Details Name: Anthony A Dubuque Jr. MRN: [id number] DOB: [birth year]   Cancelled treatment:       Reason Eval/Treat Not Completed: Fatigue/lethargy limiting ability to participate;Patient's level of consciousness. Pt unable to sustain alertness to safely participate in clinical swallow evaluation at this time. He is verbally responsive to verbal/tactile stimulation, though eyes remain closed. Pt's verbal responses are largely incoherent. Recommend avoiding PO intake unless pt is awake/alert. SLP will re-attempt as pt's alertness improves.    Anthony [name] 08/02/2023, 9:58 AM

## 2023-08-02 NOTE — Progress Notes (Signed)
 Pt's BP was in 80/52 mmhg. HR has been sustaining to 65-90s bpm. Cardizem drip stopped. On call J. Garner Nash, NP aware.

## 2023-08-02 NOTE — Progress Notes (Signed)
 OT Cancellation Note  Patient Details Name: Anthony A Fosnaugh Jr. MRN: [id number] DOB: 02/28/1956   Cancelled Treatment:    Reason Eval/Treat Not Completed: Patient's level of consciousness;Fatigue/lethargy limiting ability to participate, per chart review, pt lethargic and verbal responses incoherent. OT will re-attempt at later date/time when pt able to actively participate in therapy.   Jahnasia Tatum L. Kensi Karr, OTR/L  08/02/23, 10:41 AM

## 2023-08-02 NOTE — Progress Notes (Signed)
 PHARMACY - ANTICOAGULATION CONSULT NOTE  Pharmacy Consult for heparin   Indication:  hx atrial fibrillation, pulmonary embolus, and stroke (PTA Eliquis  on hold)  Allergies  Allergen Reactions   Other Itching and Other (See Comments)    Seasonal allergies- Itchy eyes, runny nose, congestion   Poison Ivy Extract Rash    Patient Measurements: Height: 5' 8 (172.7 cm) Weight: 64.7 kg (142 lb 10.2 oz) IBW/kg (Calculated) : 68.4 Heparin  Dosing Weight: 64 kg   Vital Signs: Temp: 97.6 F (36.4 C) (01/13 0443) Temp Source: Oral (01/13 0443) BP: 94/66 (01/13 0655) Pulse Rate: 76 (01/13 0443)  Labs: Recent Labs    07/30/23 2007 07/30/23 2126 07/31/23 0512 07/31/23 0512 07/31/23 1114 07/31/23 1816 08/01/23 0301 08/01/23 0851 08/02/23 0417  HGB  --   --  11.0*   < >  --   --  10.0*  --  9.5*  HCT  --   --  33.9*  --   --   --  30.2*  --  29.1*  PLT  --   --  204  --   --   --  211  --  213  APTT  --   --   --    < >  --  30 61* 83* 85*  LABPROT  --   --   --   --   --  18.1*  --   --   --   INR  --   --   --   --   --  1.5*  --   --   --   HEPARINUNFRC  --   --   --   --   --  >1.10* >1.10*  --  0.62  CREATININE  --   --   --   --  0.32*  --  0.52*  --  0.36*  TROPONINIHS 8 8  --   --   --   --   --   --   --    < > = values in this interval not displayed.    Estimated Creatinine Clearance: 82 mL/min (A) (by C-G formula based on SCr of 0.36 mg/dL (L)).   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  Per his chart from [place] , he fell off two storyhouse roof  while cleaning his brother's gutters and sustained compression fracture of L3, L4 and L5 and fracture of right transverse process at L3 and nondisplaced fracture of left glenoid rim many years ago. 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 And 12/2017 admissions    History of pulmonary embolus (PE) 11/02/2016   Unprovoked 11/01/2016. Xarelto  from 11/01/16 to 09/08/2017.   Hydrocele  Surgically corrected   Hypertension    Hypoalbuminemia due to protein-calorie malnutrition (HCC) 07/22/2020   Lumbar compression fracture (HCC)    Multiple rib fractures 07/22/2019   Left rib details XR: left ribs demonstrate multiple remote rib   Pancytopenia (HCC)    Rhabdomyolysis 07/22/2020   Skin cancer    exciced 2017   Tinea versicolor 06/08/2013   Tobacco use disorder 07/22/2020   Tooth infection 04/25/2018   Trigger finger, acquired 11/18/2012   Ulnar tunnel syndrome of right wrist 11/08/2012    Medications:  - on Eliquis  5 mg bid PTA   Assessment: Patient is a 68 y.o M with hx EtOH, CVA, afin and 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 He was started on CIWA and Eliquis  resumed on admission.  Due to encephalopathy and inability to take oral meds, anticoagulation changed to heparin  drip on 07/31/23.  - last dose of Eliquis  taken on 1/10 at 2215  Today, 08/02/2023: - aPTT and heparin  levels are therapeutic at 85 sec and 0.62, respectively. - cbc somewhat stable - no bleeding documented    Goal of Therapy:  Heparin  level 0.3-0.7 units/ml aPTT 66-102  seconds Monitor platelets by anticoagulation protocol: Yes   Plan:  - continue heparin  drip at 1200 units/hr - Will change to daily heparin  level and daily cbc  - monitor for s/sx bleeding   Jaice Digioia P 08/02/2023,8:31 AM

## 2023-08-02 NOTE — Consult Note (Addendum)
 Face-to-Face Psychiatry Consult   Reason for Consult:  significant recurrent agitation. Chronic. Would benefit from chronic medications Referring Physician:  Dr. Caleen  Patient Identification: Anthony [name]. 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 Pascua is a 68y/o male with a psychiatric history significant for alcohol  use disorder, depression and anxiety who presents to the ED with complaint of AMS.   Credited to [facility] FNP [place] A Morgan [name]. Is a 68 year old male patient with a medical history significant for alcohol  abuse, prior delirium tremens (DTs), multiple pulmonary emboli, documented homelessness, and medication noncompliance is being admitted for delirium. Unfortunately, he has had several recent ER visits since late December, presenting with complaints of feeling cold and erratic behavior, but he has left against medical advice each time. Upon initial documentation, the patient was observed ambulating, but today, during evaluation, he was found in bed with bilateral soft wrist restraints. He is disoriented, alert to himself and his location, but unaware of the time or situation. Some confabulation, disturbed sensorium, restlessness, and ongoing irritability were noted. At one point, he became defensive, stating, I ain't been drinking no damn alcohol , when asked about alcohol  use. He was wearing a male PureWick device, which he continued to pick at, despite it being properly placed. Initially, the patient was highly disoriented, but after several hours, he became more verbal, oriented, and cooperative. However, he remains extremely weak, unsteady, and clearly unable to care for himself. His current presentation is consistent with delirium  tremens, and he is at high risk for Wernicke's encephalopathy. Treatment will include high-dose IV thiamine .   HPI:  Anthony [name]. is a 68 y.o. male.   68 year old male with a history of alcohol  abuse, hypertension, homelessness, CVA, chronic heart failure, atrial fibrillation, history of PE in July 2024 comes in with chief complaint of homelessness.   Patient was seen in the ER multiple times yesterday.  It appears that he was sent to the [facility], likely moved to the [facility] and they had him leave because he was loud.   Patient has no complaints from his side. Upon further questioning, he said he has pain all over.  He confirms he is homeless.  He is oriented to self, location but thinks it is 70.  He states that normally he will just sleep wherever he can find a place to rest, but yesterday was white flag day.  He denies any substance use.   Assessment:  The patient is alert and oriented to person, place, and time, and is able to make his needs known. However, he is hard of hearing and tends to mumble when speaking, which may impact communication. The patient does not appear to be responding to internal stimuli, psychotic, or manic. He has expressed no interest in alcohol  use disorder treatment or abstinence at this time and minimizes alcohol  use. Although it is obvious patient is experiencing withdrawal and confusion, it is difficult to obtain an accurate history.   On examination, the patient is disoriented and restless, though his ability to be properly assessed for tremors is limited due to restraints. However, his symptoms are consistent with alcohol  withdrawal. Notably, his heart rate is elevated,he is anxious, irritable, agitated, confused and he was recently treated for  atrial fibrillation overnight. Given the patient's current presentation, these symptoms are suggestive of alcohol  withdrawal, and continued monitoring is necessary to ensure appropriate management and prevent  complications such as seizures or delirium tremens.  08/02/2023 Upon assessment Anthony [name]'s nurse was in the room feeding him applesauce. Client remains in bilateral mitt restraints due to pulling at lines/ equipment.  Nurse reported after 0800 he began calling into the hallway that he is trapped in bed. At this time scheduled PO Ativan  was administered versus the Zyprexa . Client was sleeping and unable to answer questions. His nurse reports he is A&Ox1 and makes incoherent sentences. Additionally, he stated the client got very Nest sleep last night and does not have any sleep aids ordered. RN reports the client at his dinner last night but thus far only had a few bites of apple sauce and a sip of his ensure. Unable to assess depression and anxiety levels at this time due to decreased orientation.  Past Psychiatric History:  Dx: depression, anxiety, alcohol  [use disorder] Denies h/o psych hosp Denies h/o suicide attempts  Risk to Self:  denies Risk to Others:  denies Prior Inpatient Therapy:  denies, chart review shows [facility] 07/2022 Prior Outpatient Therapy:  denies  Past Medical History:  Past Medical History:  Diagnosis Date   Actinic keratosis 11/27/2016   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) 07/18/2018   Alcohol  use disorder, severe, dependence (HCC) 09/16/2006   05/22/2015 Argumentative, belligerent and verbally abusive to staff per his note from [place]     Alcoholism (HCC)    Anxiety state 04/25/2018   Aortic atherosclerosis (HCC) 08/27/2022   Chronic low back pain 09/16/2006   Followed by NS.  Per his chart from [place] , he fell off two storyhouse roof while cleaning his brother's gutters and sustained compression fracture of L3, L4 and L5 and fracture of right transverse process at L3 and nondisplaced fracture of left glenoid rim many years ago. 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 And 12/2017 admissions    History of pulmonary embolus (PE) 11/02/2016   Unprovoked 11/01/2016. Xarelto  from 11/01/16 to 09/08/2017.   Hydrocele    Surgically corrected   Hypertension    Hypoalbuminemia due to protein-calorie malnutrition (HCC) 07/22/2020   Lumbar compression fracture (HCC)    Multiple rib fractures 07/22/2019   Left rib details XR: left ribs demonstrate multiple remote rib   Pancytopenia (HCC)    Rhabdomyolysis 07/22/2020   Skin cancer    exciced 2017   Tinea versicolor 06/08/2013   Tobacco use disorder 07/22/2020   Tooth infection 04/25/2018   Trigger finger, acquired 11/18/2012   Ulnar tunnel syndrome of right wrist 11/08/2012    Past Surgical History:  Procedure Laterality Date   BIOPSY  05/21/2022   Procedure: BIOPSY;  Surgeon: Charlanne Groom, MD;  Location: [facility] ENDOSCOPY;  Service: Gastroenterology;;   CATARACT EXTRACTION     ESOPHAGOGASTRODUODENOSCOPY (EGD) WITH PROPOFOL  N/A 05/21/2022   Procedure: ESOPHAGOGASTRODUODENOSCOPY (EGD) WITH PROPOFOL ;  Surgeon: Charlanne Groom, MD;  Location: [facility] ENDOSCOPY;  Service: Gastroenterology;  Laterality: N/A;   FOREIGN BODY REMOVAL N/A 05/21/2022   Procedure:  FOREIGN BODY REMOVAL;  Surgeon: Charlanne Groom, MD;  Location: [facility] ENDOSCOPY;  Service: Gastroenterology;  Laterality: N/A;   HIATAL HERNIA REPAIR  2016   HYDROCELE EXCISION / REPAIR  2010   SKIN CANCER EXCISION Left    Excised 2017   Family History:  Family History  Problem Relation Age of Onset   Heart attack Father        died at 73 years   Dementia Father    Stroke Father    Cancer Sister    Colon cancer Neg Hx    Colon polyps Neg Hx     Esophageal cancer Neg Hx    Stomach cancer Neg Hx    Rectal cancer Neg Hx    Family Psychiatric  History: does not report  Social History:  Social History   Substance and Sexual Activity  Alcohol  Use Yes   Alcohol /week: 43.0 standard drinks of alcohol    Types: 42 Cans of beer, 1 Standard drinks or equivalent per week     Social History   Substance and Sexual Activity  Drug Use Yes   Types: Marijuana    Social History   Socioeconomic History   Marital status: Single    Spouse name: Not on file   Number of children: Not on file   Years of education: Not on file   Highest education level: Not on file  Occupational History   Occupation: unemployed  Tobacco Use   Smoking status: Never   Smokeless tobacco: Current    Types: Snuff   Tobacco comments:    Pt states do snuff when stressed had 1 can last 3 month//PL  Vaping Use   Vaping status: Never Used  Substance and Sexual Activity   Alcohol  use: Yes    Alcohol /week: 43.0 standard drinks of alcohol     Types: 42 Cans of beer, 1 Standard drinks or equivalent per week   Drug use: Yes    Types: Marijuana   Sexual activity: Yes    Birth control/protection: None  Other Topics Concern   Not on file  Social History Narrative   Lives with a friend. Has children but hasn't met them in a while. He doesn't know where they live.    Social Drivers of Corporate Investment Banker Strain: Not on file  Food Insecurity: Patient Unable To Answer (07/28/2023)   Hunger Vital Sign    Worried About Running Out of Food in the Last Year: Patient unable to answer    Ran Out of Food in the Last Year: Patient unable to answer  Recent Concern: Food Insecurity - Food Insecurity Present (07/08/2023)   Hunger Vital Sign    Worried About Running Out of Food in the Last Year: Often true    Ran Out of Food in the Last Year: Often true  Transportation Needs: Patient Unable To Answer (07/28/2023)   PRAPARE - Administrator, Civil Service  (Medical): Patient unable to answer    Lack of Transportation (Non-Medical): Patient unable to answer  Recent Concern: Transportation Needs - Unmet Transportation Needs (07/08/2023)   PRAPARE - Administrator, Civil Service (Medical): Yes    Lack of Transportation (Non-Medical): Yes  Physical Activity: Not on file  Stress: Not on file  Social Connections: Patient Unable To Answer (07/28/2023)   Social Connection and Isolation Panel [NHANES]    Frequency of Communication with Friends and Family: Patient unable to answer    Frequency of Social Gatherings with Friends and  Family: Patient unable to answer    Attends Religious Services: Patient unable to answer    Active Member of Clubs or Organizations: Patient unable to answer    Attends Club or Organization Meetings: Patient unable to answer    Marital Status: Patient unable to answer   Additional Social History:    Allergies:   Allergies  Allergen Reactions   Other Itching and Other (See Comments)    Seasonal allergies- Itchy eyes, runny nose, congestion   Poison Ivy Extract Rash    Labs:  Results for orders placed or performed during the hospital encounter of 07/28/23 (from the past 48 hours)  Comprehensive metabolic panel     Status: Abnormal   Collection Time: 07/31/23 11:14 AM  Result Value Ref Range   Sodium 127 (L) 135 - 145 mmol/L    Comment: DELTA CHECK NOTED   Potassium 3.4 (L) 3.5 - 5.1 mmol/L   Chloride 101 98 - 111 mmol/L   CO2 18 (L) 22 - 32 mmol/L   Glucose, Bld 109 (H) 70 - 99 mg/dL    Comment: Glucose reference range applies only to samples taken after fasting for at least 8 hours.   BUN 11 8 - 23 mg/dL   Creatinine, Ser 9.67 (L) 0.61 - 1.24 mg/dL   Calcium  8.4 (L) 8.9 - 10.3 mg/dL   Total Protein 7.0 6.5 - 8.1 g/dL   Albumin  3.1 (L) 3.5 - 5.0 g/dL   AST 25 15 - 41 U/L   ALT 13 0 - 44 U/L   Alkaline Phosphatase 80 38 - 126 U/L   Total Bilirubin 1.3 (H) 0.0 - 1.2 mg/dL   GFR, Estimated >39 >39  mL/min    Comment: (NOTE) Calculated using the CKD-EPI Creatinine Equation (2021)    Anion gap 8 5 - 15    Comment: Performed at [facility], [address] [place], [place] [address]  Magnesium      Status: Abnormal   Collection Time: 07/31/23 11:14 AM  Result Value Ref Range   Magnesium  1.4 (L) 1.7 - 2.4 mg/dL    Comment: Performed at [facility], [address], [place], [place] [address]  Phosphorus     Status: None   Collection Time: 07/31/23 11:14 AM  Result Value Ref Range   Phosphorus 3.0 2.5 - 4.6 mg/dL    Comment: Performed at [facility], [address] [place], [place] [address]  Heparin  level (unfractionated)     Status: Abnormal   Collection Time: 07/31/23  6:16 PM  Result Value Ref Range   Heparin  Unfractionated >1.10 (H) 0.30 - 0.70 IU/mL    Comment: (NOTE) The clinical reportable range upper limit is being lowered to >1.10 to align with the FDA approved guidance for the current laboratory assay.  If heparin  results are below expected values, and patient dosage has  been confirmed, suggest follow up testing of antithrombin III levels. Performed at [facility], [address] [place], [place] [address]   APTT     Status: None   Collection Time: 07/31/23  6:16 PM  Result Value Ref Range   aPTT 30 24 - 36 seconds    Comment: Performed at [facility], [address] [place], [place] [address]  Protime-INR     Status: Abnormal   Collection Time: 07/31/23  6:16 PM  Result Value Ref Range   Prothrombin Time 18.1 (H) 11.4 - 15.2 seconds   INR 1.5 (H)  0.8 - 1.2    Comment: (NOTE) INR goal varies based on device and disease states. Performed at [facility], [address] [place], [place] [address]   Heparin  level (unfractionated)     Status: Abnormal   Collection Time: 08/01/23  3:01 AM  Result Value Ref Range   Heparin  Unfractionated >1.10  (H) 0.30 - 0.70 IU/mL    Comment: (NOTE) The clinical reportable range upper limit is being lowered to >1.10 to align with the FDA approved guidance for the current laboratory assay.  If heparin  results are below expected values, and patient dosage has  been confirmed, suggest follow up testing of antithrombin III levels. Performed at [facility], [address] [place], [place] [address]   Comprehensive metabolic panel     Status: Abnormal   Collection Time: 08/01/23  3:01 AM  Result Value Ref Range   Sodium 129 (L) 135 - 145 mmol/L   Potassium 4.2 3.5 - 5.1 mmol/L   Chloride 104 98 - 111 mmol/L   CO2 16 (L) 22 - 32 mmol/L   Glucose, Bld 100 (H) 70 - 99 mg/dL    Comment: Glucose reference range applies only to samples taken after fasting for at least 8 hours.   BUN 12 8 - 23 mg/dL   Creatinine, Ser 9.47 (L) 0.61 - 1.24 mg/dL   Calcium  8.3 (L) 8.9 - 10.3 mg/dL   Total Protein 6.6 6.5 - 8.1 g/dL   Albumin  2.8 (L) 3.5 - 5.0 g/dL   AST 26 15 - 41 U/L   ALT 12 0 - 44 U/L   Alkaline Phosphatase 79 38 - 126 U/L   Total Bilirubin 1.5 (H) 0.0 - 1.2 mg/dL   GFR, Estimated >39 >39 mL/min    Comment: (NOTE) Calculated using the CKD-EPI Creatinine Equation (2021)    Anion gap 9 5 - 15    Comment: Performed at [facility], [address] [place], [place] [address]  CBC with Differential/Platelet     Status: Abnormal   Collection Time: 08/01/23  3:01 AM  Result Value Ref Range   WBC 7.4 4.0 - 10.5 K/uL   RBC 3.08 (L) 4.22 - 5.81 MIL/uL   Hemoglobin 10.0 (L) 13.0 - 17.0 g/dL   HCT 69.7 (L) 60.9 - 47.9 %   MCV 98.1 80.0 - 100.0 fL   MCH 32.5 26.0 - 34.0 pg   MCHC 33.1 30.0 - 36.0 g/dL   RDW 82.1 (H) 88.4 - 84.4 %   Platelets 211 150 - 400 K/uL   nRBC 0.0 0.0 - 0.2 %   Neutrophils Relative % 76 %   Neutro Abs 5.5 1.7 - 7.7 K/uL   Lymphocytes Relative 13 %   Lymphs Abs 0.9 0.7 - 4.0 K/uL   Monocytes Relative 11 %   Monocytes Absolute 0.8  0.1 - 1.0 K/uL   Eosinophils Relative 0 %   Eosinophils Absolute 0.0 0.0 - 0.5 K/uL   Basophils Relative 0 %   Basophils Absolute 0.0 0.0 - 0.1 K/uL   Immature Granulocytes 0 %   Abs Immature Granulocytes 0.03 0.00 - 0.07 K/uL    Comment: Performed at [facility], [address], [place], [place] [address]  Magnesium      Status: None   Collection Time: 08/01/23  3:01 AM  Result Value Ref Range   Magnesium  2.1 1.7 - 2.4 mg/dL    Comment: Performed at [facility], [address] [place], [place] [address]  Phosphorus     Status: None   Collection Time: 08/01/23  3:01 AM  Result Value Ref Range   Phosphorus 2.5 2.5 - 4.6 mg/dL    Comment: Performed at [facility], [address] [place], [place] [address]  APTT     Status: Abnormal   Collection Time: 08/01/23  3:01 AM  Result Value Ref Range   aPTT 61 (H) 24 - 36 seconds    Comment:        IF BASELINE aPTT IS ELEVATED, SUGGEST PATIENT RISK ASSESSMENT BE USED TO DETERMINE APPROPRIATE ANTICOAGULANT THERAPY. Performed at [facility], [address] [place], [place] [address]   APTT     Status: Abnormal   Collection Time: 08/01/23  8:51 AM  Result Value Ref Range   aPTT 83 (H) 24 - 36 seconds    Comment:        IF BASELINE aPTT IS ELEVATED, SUGGEST PATIENT RISK ASSESSMENT BE USED TO DETERMINE APPROPRIATE ANTICOAGULANT THERAPY. Performed at [facility], [address] [place], [place] [address]   Heparin  level (unfractionated)     Status: None   Collection Time: 08/02/23  4:17 AM  Result Value Ref Range   Heparin  Unfractionated 0.62 0.30 - 0.70 IU/mL    Comment: (NOTE) The clinical reportable range upper limit is being lowered to >1.10 to align with the FDA approved guidance for the current laboratory assay.  If heparin  results are below expected values, and patient dosage has  been confirmed, suggest follow up testing of  antithrombin III levels. Performed at [facility], [address], [place], [place] [address]   APTT     Status: Abnormal   Collection Time: 08/02/23  4:17 AM  Result Value Ref Range   aPTT 85 (H) 24 - 36 seconds    Comment:        IF BASELINE aPTT IS ELEVATED, SUGGEST PATIENT RISK ASSESSMENT BE USED TO DETERMINE APPROPRIATE ANTICOAGULANT THERAPY. Performed at [facility], [address] [place], [place] [address]   Comprehensive metabolic panel     Status: Abnormal   Collection Time: 08/02/23  4:17 AM  Result Value Ref Range   Sodium 135 135 - 145 mmol/L   Potassium 3.7 3.5 - 5.1 mmol/L   Chloride 111 98 - 111 mmol/L   CO2 18 (L) 22 - 32 mmol/L   Glucose, Bld 96 70 - 99 mg/dL    Comment: Glucose reference range applies only to samples taken after fasting for at least 8 hours.   BUN 11 8 - 23 mg/dL   Creatinine, Ser 9.63 (L) 0.61 - 1.24 mg/dL   Calcium  8.9 8.9 - 10.3 mg/dL   Total Protein 6.6 6.5 - 8.1 g/dL   Albumin  2.8 (L) 3.5 - 5.0 g/dL   AST 21 15 - 41 U/L   ALT 12 0 - 44 U/L   Alkaline Phosphatase 80 38 - 126 U/L   Total Bilirubin 1.1 0.0 - 1.2 mg/dL   GFR, Estimated >39 >39 mL/min    Comment: (NOTE) Calculated using the CKD-EPI Creatinine Equation (2021)    Anion gap 6 5 - 15    Comment: Performed at [facility], [address] [place], [place] [address]  Magnesium      Status: None   Collection Time: 08/02/23  4:17 AM  Result Value Ref Range   Magnesium  1.7 1.7 - 2.4 mg/dL  Comment: Performed at [facility], [address], [place], [place] [address]  Phosphorus     Status: None   Collection Time: 08/02/23  4:17 AM  Result Value Ref Range   Phosphorus 2.7 2.5 - 4.6 mg/dL    Comment: Performed at [facility], [address], [place], [place] [address]  CBC with Differential/Platelet     Status: Abnormal   Collection Time: 08/02/23  4:17 AM  Result Value Ref  Range   WBC 5.9 4.0 - 10.5 K/uL   RBC 2.93 (L) 4.22 - 5.81 MIL/uL   Hemoglobin 9.5 (L) 13.0 - 17.0 g/dL   HCT 70.8 (L) 60.9 - 47.9 %   MCV 99.3 80.0 - 100.0 fL   MCH 32.4 26.0 - 34.0 pg   MCHC 32.6 30.0 - 36.0 g/dL   RDW 82.5 (H) 88.4 - 84.4 %   Platelets 213 150 - 400 K/uL   nRBC 0.0 0.0 - 0.2 %   Neutrophils Relative % 63 %   Neutro Abs 3.8 1.7 - 7.7 K/uL   Lymphocytes Relative 19 %   Lymphs Abs 1.1 0.7 - 4.0 K/uL   Monocytes Relative 13 %   Monocytes Absolute 0.8 0.1 - 1.0 K/uL   Eosinophils Relative 3 %   Eosinophils Absolute 0.2 0.0 - 0.5 K/uL   Basophils Relative 1 %   Basophils Absolute 0.0 0.0 - 0.1 K/uL   Immature Granulocytes 1 %   Abs Immature Granulocytes 0.03 0.00 - 0.07 K/uL    Comment: Performed at [facility], [address], [place], [place] [address]  Vitamin B12     Status: None   Collection Time: 08/02/23  4:17 AM  Result Value Ref Range   Vitamin B-12 473 180 - 914 pg/mL    Comment: (NOTE) This assay is not validated for testing neonatal or myeloproliferative syndrome specimens for Vitamin B12 levels. Performed at [facility], [address], [place], [place] [address]   Folate     Status: None   Collection Time: 08/02/23  4:17 AM  Result Value Ref Range   Folate 15.0 >5.9 ng/mL    Comment: Performed at [facility], [address], [place], [place] [address]  Iron  and TIBC     Status: Abnormal   Collection Time: 08/02/23  4:17 AM  Result Value Ref Range   Iron  17 (L) 45 - 182 ug/dL   TIBC 664 749 - 549 ug/dL   Saturation Ratios 5 (L) 17.9 - 39.5 %   UIBC 318 ug/dL    Comment: Performed at [facility], [address], [place], [place] [address]  Ferritin     Status: None   Collection Time: 08/02/23  4:17 AM  Result Value Ref Range   Ferritin 128 24 - 336 ng/mL    Comment: Performed at [facility], [address], [place], [place] [address]   Reticulocytes     Status: Abnormal   Collection Time: 08/02/23  4:17 AM  Result Value Ref Range   Retic Ct Pct 1.3 0.4 - 3.1 %   RBC. 2.94 (L) 4.22 - 5.81 MIL/uL   Retic Count, Absolute 37.0 19.0 - 186.0 K/uL   Immature Retic Fract 13.7 2.3 - 15.9 %    Comment: Performed at [facility], [address], [place], [place] [address]    Current Facility-Administered Medications  Medication Dose Route Frequency Provider Last Rate Last Admin  acetaminophen  (TYLENOL ) tablet 650 mg  650 mg Oral Q6H PRN Zella, Mir M, MD   650 mg at 08/01/23 2252   Or   acetaminophen  (TYLENOL ) suppository 650 mg  650 mg Rectal Q6H PRN Zella, Mir M, MD       diltiazem  (CARDIZEM ) 125 mg in dextrose  5% 125 mL (1 mg/mL) infusion  5-15 mg/hr Intravenous Continuous Andrez Chroman, NP   Stopped at 08/02/23 0640   enoxaparin  (LOVENOX ) injection 65 mg  65 mg Subcutaneous Q12H Sheikh, Omair Latif, DO   65 mg at 08/02/23 1001   feeding supplement (ENSURE ENLIVE / ENSURE PLUS) liquid 237 mL  237 mL Oral BID BM Zella, Mir M, MD   237 mL at 08/02/23 9071   folic acid  (FOLVITE ) tablet 1 mg  1 mg Oral Daily Sheikh, Omair Latif, DO   1 mg at 08/02/23 0930   hydrALAZINE  (APRESOLINE ) injection 10 mg  10 mg Intravenous Q4H PRN Amin, Ankit C, MD       hydrOXYzine  (ATARAX ) tablet 25 mg  25 mg Oral Q6H PRN Wilkie Majel RAMAN, FNP   25 mg at 08/01/23 2253   ipratropium-albuterol  (DUONEB) 0.5-2.5 (3) MG/3ML nebulizer solution 3 mL  3 mL Nebulization Q4H PRN Amin, Ankit C, MD       loperamide  (IMODIUM ) capsule 2-4 mg  2-4 mg Oral PRN Wilkie Majel RAMAN, FNP       [START ON 08/03/2023] LORazepam  (ATIVAN ) tablet 1 mg  1 mg Oral Daily Starkes-Perry, Takia S, FNP       magnesium  sulfate IVPB 2 g 50 mL  2 g Intravenous Once Sheikh, Omair Parksley, DO 50 mL/hr at 08/02/23 0943 2 g at 08/02/23 9056   metoprolol  tartrate (LOPRESSOR ) injection 5 mg  5 mg Intravenous Q4H PRN Amin, Ankit C, MD   5 mg at 07/30/23  0543   multivitamin with minerals tablet 1 tablet  1 tablet Oral Daily Sheikh, Omair Latif, DO   1 tablet at 08/02/23 0930   OLANZapine  (ZYPREXA ) injection 5 mg  5 mg Intramuscular Q6H PRN Amin, Ankit C, MD   5 mg at 08/02/23 0220   ondansetron  (ZOFRAN ) injection 4 mg  4 mg Intravenous Q6H PRN Amin, Ankit C, MD       ondansetron  (ZOFRAN -ODT) disintegrating tablet 4 mg  4 mg Oral Q6H PRN Wilkie Majel RAMAN, FNP       piperacillin -tazobactam (ZOSYN ) IVPB 3.375 g  3.375 g Intravenous Q8H Lynwood Setter, RPH 12.5 mL/hr at 08/02/23 0442 3.375 g at 08/02/23 0442   potassium chloride  10 mEq in 100 mL IVPB  10 mEq Intravenous Q1 Hr x 4 Sheikh, Omair Latif, DO 100 mL/hr at 08/02/23 0958 10 mEq at 08/02/23 9041   potassium chloride  SA (KLOR-CON  M) CR tablet 40 mEq  40 mEq Oral Once Sheikh, Omair Latif, DO       thiamine  (VITAMIN B1) 500 mg in sodium chloride  0.9 % 50 mL IVPB  500 mg Intravenous Q24H Wilkie Majel RAMAN, FNP 110 mL/hr at 08/02/23 0946 500 mg at 08/02/23 0946   [START ON 08/07/2023] thiamine  (VITAMIN B1) tablet 100 mg  100 mg Oral Daily Sherrill Cable [place], DO       Or   [START ON 08/07/2023] thiamine  (VITAMIN B1) injection 100 mg  100 mg Intravenous Daily Sheikh, Omair Latif, DO       vancomycin  (VANCOCIN ) 750 mg in sodium chloride  0.9 % 250 mL IVPB  750 mg Intravenous Q12H Shade, Christine E, RPH   Stopped  at 08/01/23 2103    Musculoskeletal: Strength & Muscle Tone: Laying in bed   Gait & Station: Laying in bed   Patient leans: Laying in bed   Psychiatric Specialty Exam: Physical Exam Vitals reviewed.  Constitutional:      General: He is not in acute distress.    Appearance: He is not toxic-appearing.  Pulmonary:     Effort: Pulmonary effort is normal.  Neurological:     Mental Status: He is alert.     Review of Systems  Constitutional:  Negative for chills and fever.  Psychiatric/Behavioral:  Positive for depression and substance abuse. Negative for hallucinations and  suicidal ideas. The patient has insomnia. The patient is not nervous/anxious.     Blood pressure 113/79, pulse 80, temperature 97.6 F (36.4 C), temperature source Oral, resp. rate 19, height 5' 8 (1.727 m), weight 64.7 kg, SpO2 99%.Body mass index is 21.69 kg/m.  General Appearance: Disheveled  Eye Contact:  Poor  Speech:  mumbles, incoherently  Volume:  Decreased  Mood:  Unable to assess, UTA  Affect:  blunted  Thought Process:  UTA  Orientation:  UTA  Thought Content:  Illogical  Suicidal Thoughts:   UTA  Homicidal Thoughts:   UTA  Memory:  UTA  Judgement:  Impaired  Insight:  UTA  Psychomotor Activity:  Decreased  Concentration:  UTA  Recall:  UTA  Fund of Knowledge:  UTA  Language:  Poor  Akathisia:  Negative  Handed:  Right  AIMS (if indicated):     Assets:  resilency  ADL's:  impaired  Cognition:  Moderate io severe impairment  Sleep:  Poor     Physical Exam: Physical Exam Vitals reviewed.  Constitutional:      General: He is not in acute distress.    Appearance: He is not toxic-appearing.  Pulmonary:     Effort: Pulmonary effort is normal.  Neurological:     Mental Status: He is alert.    Review of Systems  Constitutional:  Negative for chills and fever.  Psychiatric/Behavioral:  Positive for depression and substance abuse. Negative for hallucinations and suicidal ideas. The patient has insomnia. The patient is not nervous/anxious.    Blood pressure 94/66, pulse 76, temperature 97.6 F (36.4 C), temperature source Oral, resp. rate 19, height 5' 8 (1.727 m), weight 64.7 kg, SpO2 99%. Body mass index is 21.69 kg/m.  Treatment Plan Summary: Delirium Alcohol  use disorder, severe   Plan: -Pt does not need inpt psych hosp at this time, however this may change. -Ciwa and ativan  detox protocol -Continue with IV thiamine  for 9 doses (adjustment mad primary team already ordered).  -medical recommendations as per primary team -pt would benefit from  substance use treatment - social worker can offer available community options   Disposition: No evidence of imminent risk to self or others at present.   Patient does not meet criteria for psychiatric inpatient admission. Supportive therapy provided about ongoing stressors. Discussed crisis plan, support from social network, calling 911, coming to the Emergency Department, and calling Suicide Hotline.   Sharlot Becker, NP 08/02/2023 10:35 AM

## 2023-08-02 NOTE — Progress Notes (Signed)
 PROGRESS NOTE    Anthony A Almgren Jr.  FMW:[id number] DOB: 20-Jun-1956 DOA: 07/28/2023 PCP: Pcp, No   Brief Narrative:  The patient is a 68 year old with history of severe alcohol  use with frequent withdrawal and delirium in the past, paroxysmal A-fib not on anticoagulation, HLD, HTN, recurrent PE, hiatal hernia, homelessness, and iron  deficiency comes to the hospital from a shelter due to delirium.  Continues to be confused there is concern for alcohol  withdrawal and encephalopathy workup is underway and patient went into A-fib with RVR overnight and is now on a diltiazem  drip.  Subsequently he also spiked a temperature and became tachycardic so we will need to work him up for infectious etiology and are checking blood cultures, urinalysis and urine culture and a chest x-ray.  Continues to be encephalopathic but was [place] bit more awake but more confused today.  So we will discontinue IV lorazepam .  We discussed with neurology who recommends if the patient does not improve tomorrow we will get a LP but Neurology recommends changing the cefepime  to Zosyn  given his encephalopathy.  He is Fatheree bit more awake and alert today and oriented x 1.  Was more interactive and wanting to get out of the bed.  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   -Currently on as needed Zyprexa  and Ativan .   -Ammonia and B12 are normal, TSH is as below.   -For now placed him on high-dose thiamine  and his procedure 9 doses.   -CT of the head is negative.  Currently in restraints as he poses danger to self and others. -His confusion and encephalopathy today  we will check an MRI and EEG; given that went into A-fib with RVR concern he may have had a stroke given his intermittent usage of Eliquis  -MRI done and showed -EEG done and showed This study is suggestive of mild diffuse encephalopathy. No seizures or epileptiform discharges were seen throughout the recording. -C/w CIWA Protocol but discontinued IV lorazepam  given that he is extreme encephalopathic today and somnolent and drowsy -Have consulted psychiatry as he may require chronic medication and they feel that he does not meet inpatient hospitalization at this time but they have initiated Remeron  7.5 mg at bedtime for sleep and MDD and recommending continuing CIWA and Ativan  detox protocol and recommending continue IV thiamine  for 9 doses.  Patient has also not been initiated on a lorazepam  taper per psychiatry -Continues to be encephalopathic so discussed with neurology who recommends that if he continues to be encephalopathic obtain an LP in the morning but recommends changing IV cefepime  to IV Zosyn  -SLP evaluated and his fatigue and lethargy limited his participation -Today he was alert and oriented x 1 -Psychiatry continue IV thiamine  and per psychiatry if patient continues to be delirious and if there is any concern for sedation with Zyprexa  they are recommending considering lowering the dose to 2.5 and recommending that if his QTc is greater than 500 that they recommended to discontinue Zyprexa   Recurrent Pulmonary Embolism -Continue Apixaban  but will change to heparin  drip given that he is not taking very much oral intake and now we have changed to full dose Lovenox   Fever, improving  -  Unclear etiology but improving  -Will check blood cultures x 2, urinalysis and urine culture and chest x-ray -UA is negative and blood cultures pending -Chest x-ray done and showed Heart is mildly enlarged. No overt edema. Bibasilar opacities likely reflect atelectasis. No effusions or pneumothorax. No  acute bony abnormality. -Treated with supportive care and IV fluid hydration has now stopped -Initiated empiric antibiotics with IV Cefepime  and IV Vancomycin  will change IV cefepime  to IV Zosyn  given his continued encephalopathy -Today is day 4 of antibiotics -Chest x-ray done yesterday showed  Ongoing low lung volumes with veiling right lung base opacity which is probably unresolved right pleural effusion seen in December. No other convincing acute cardiopulmonary abnormality. -Will repeat chest x-ray in a.m.  Essential Hypertension -Continue with IV Hydralazine  10 mg Q4 as needed for systolic blood pressure greater than 180 -Currently on a diltiazem  drip for his A-fib with RVR -Continue to Monitor BP per Protocol -Last BP reading was 114/68  Paroxysmal Atrial Fibrillation now with RVR -Metoprolol  twice now stopped and placed on a diltiazem  drip.  IV as needed.   -Given his encephalopathy will change him to heparin  drip which is now being changed to a Lovenox  injections -Recent ECHO done 3 weeks ago showed Hypokinesis of the inferior wall from the base to the mid wall. Left ventricular ejection fraction, by estimation, is 45 to 50%. The left ventricle has mildly decreased function. The left ventricle has no regional wall motion abnormalities. Left ventricular diastolic parameters are indeterminate. Right ventricular systolic function is mildly reduced. The right ventricular size is moderately enlarged. There is normal pulmonary artery  systolic pressure.  -Check Troponin I x2 -IV hydration has now stopped -TSH was 2.967  Hypomagnesemia -Patient's Mag Level Trend: Recent Labs  Lab 07/09/23 0724 07/11/23 0730 07/12/23 0246 07/30/23 0403 07/31/23 1114 08/01/23 0301 08/02/23 0417  MG 1.5* 1.4* 2.1 1.4* 1.4* 2.1 1.7  -Replete with IV Mag Sulfate 2 g today -Continue to Monitor and Replete as Necessary -Repeat Mag in the AM   Hypokalemia -Patient's K+ Level Trend: Recent Labs   Lab 07/12/23 1801 07/14/23 2321 07/27/23 1541 07/30/23 0403 07/31/23 1114 08/01/23 0301 08/02/23 0417  K 3.9 3.8 3.7 3.7 3.4* 4.2 3.7  -Continue to Monitor and Replete as Necessary -Repeat CMP in the AM   Hyponatremia -Na+ Trend: Recent Labs  Lab 07/12/23 1801 07/14/23 2321 07/27/23 1541 07/30/23 0403 07/31/23 1114 08/01/23 0301 08/02/23 0417  NA 136 140 141 136 127* 129* 135  -IV fluid hydration now stopped Will need to continue to monitor and trend and repeat CMP in a.m.  Metabolic Acidosis -Mild and worsening. CO2 is now 18, AG is 6, Chloride Level is 111 -IV fluid hydration has now stopped -Continue to Monitor and Trend and repeat CMP in the AM  Hyperlipidemia -Hold Statin but resume when oral intake is better  Macrocytic Anemia -Hgb/Hct Trend: Recent Labs  Lab 07/12/23 1801 07/14/23 2321 07/27/23 1541 07/30/23 0403 07/31/23 0512 08/01/23 0301 08/02/23 0417  HGB 10.0* 9.0* 9.1* 9.9* 11.0* 10.0* 9.5*  HCT 32.3* 28.8* 29.4* 31.2* 33.9* 30.2* 29.1*  MCV 102.2* 102.5* 105.4* 103.7* 100.9* 98.1 99.3  -Checked Anemia Panel and showed an iron  level of 17, UIBC 318, TIBC 335, saturation ratios of 5%, ferritin level 128, folate level 15.0, vitamin B12 470. -Continue to Monitor for S/Sx of Bleeding; No overt bleeding noted -Repeat CBC in the AM   Hyperbilirubinemia -Bilirubin Trend: Recent Labs  Lab 07/11/23 0730 07/12/23 1801 07/14/23 2321  07/27/23 1541 07/31/23 1114 08/01/23 0301 08/02/23 0417  BILITOT 0.8 0.6 0.7 0.8 1.3* 1.5* 1.1  -Continue to Monitor and Trend and repeat CMP In the AM  Hypoalbuminemia -Patient's Albumin  Trend: Recent Labs  Lab 07/11/23 0730 07/12/23 1801 07/14/23 2321 07/27/23 1541 07/31/23 1114 08/01/23 0301 08/02/23 0417  ALBUMIN  2.8* 3.0* 3.1* 3.6 3.1* 2.8* 2.8*  -Continue to Monitor and Trend and repeat CMP in the AM   DVT prophylaxis: SCDs Start: 07/28/23 1549    Code Status: Full Code Family Communication: No  family present at bedside  Disposition Plan:  Level of care: Progressive Status is: Inpatient Remains inpatient appropriate because: His further clinical improvement and improvement in his mental status   Consultants:  Psychiatry Discussed the case with Neurology  Procedures:  As delineated as above   Antimicrobials:  Anti-infectives (From admission, onward)    Start     Dose/Rate Route Frequency Ordered Stop   08/01/23 1630  piperacillin -tazobactam (ZOSYN ) IVPB 3.375 g        3.375 g 12.5 mL/hr over 240 Minutes Intravenous Every 8 hours 08/01/23 1537     07/31/23 1000  vancomycin  (VANCOCIN ) 750 mg in sodium chloride  0.9 % 250 mL IVPB        750 mg 250 mL/hr over 60 Minutes Intravenous Every 12 hours 07/30/23 2034     07/30/23 2100  vancomycin  (VANCOREADY) IVPB 1250 mg/250 mL        1,250 mg 166.7 mL/hr over 90 Minutes Intravenous  Once 07/30/23 2013 07/31/23 0700   07/30/23 2030  ceFEPIme  (MAXIPIME ) 2 g in sodium chloride  0.9 % 100 mL IVPB  Status:  Discontinued        2 g 200 mL/hr over 30 Minutes Intravenous Every 8 hours 07/30/23 2013 08/01/23 1533       Subjective: Seen and examined at bedside and he is a Pasquarelli bit more awake and alert today and was able to actually interact with me as a for the last few days when he is encephalopathic.  Was able to tell me his name and his birthday today and started mumbling but was interactive today compared to all other days of seeing them.  Denies any nausea or vomiting.  Objective: Vitals:   08/02/23 1500 08/02/23 1600 08/02/23 1700 08/02/23 2100  BP: 118/78 129/64 114/68   Pulse:  89  100  Resp: 20 15 19    Temp:    97.7 F (36.5 C)  TempSrc:    Oral  SpO2:   95% 97%  Weight:      Height:        Intake/Output Summary (Last 24 hours) at 08/02/2023 2112 Last data filed at 08/02/2023 1836 Gross per 24 hour  Intake 1728.5 ml  Output 2950 ml  Net -1221.5 ml   Filed Weights   07/28/23 2159  Weight: 64.7 kg    Examination: Physical Exam:  Constitutional: Thin chronically ill-appearing Caucasian male who is is not as confused although is more awake and alert and oriented x 1 Respiratory: Diminished to auscultation bilaterally, no wheezing, rales, rhonchi or crackles. Normal respiratory effort and patient is not tachypenic. No accessory muscle use.  Unlabored breathing Cardiovascular: Irregularly irregular, no murmurs / rubs / gallops. S1 and S2 auscultated.  No appreciable extremity edema Abdomen: Soft, non-tender, non-distended. Bowel sounds positive.  GU: Deferred. Musculoskeletal: No clubbing / cyanosis of digits/nails. No joint deformity upper and lower extremities. Skin: No rashes, lesions, ulcers on limited skin evaluation. No induration; Warm and dry.  Neurologic: Confused but moves extremities independently Psychiatric: Impaired judgment and insight.  He is alert and oriented x 1  Data Reviewed: I have personally reviewed following labs and imaging studies  CBC: Recent Labs  Lab 07/27/23 1541 07/30/23 0403 07/31/23 0512 08/01/23 0301 08/02/23 0417  WBC 3.1* 4.5 8.5 7.4 5.9  NEUTROABS  --   --   --  5.5 3.8  HGB 9.1* 9.9* 11.0* 10.0* 9.5*  HCT 29.4* 31.2* 33.9* 30.2* 29.1*  MCV 105.4* 103.7* 100.9* 98.1 99.3  PLT 217 231 204 211 213   Basic Metabolic Panel: Recent Labs  Lab 07/27/23 1541 07/30/23 0403 07/31/23 1114 08/01/23 0301 08/02/23 0417  NA 141 136 127* 129* 135  K 3.7 3.7 3.4* 4.2 3.7  CL 109 105 101 104 111  CO2 22 22 18* 16* 18*  GLUCOSE 79 84 109* 100* 96  BUN 16 15 11 12 11   CREATININE 0.55* 0.65 0.32* 0.52* 0.36*  CALCIUM  8.9 8.5* 8.4* 8.3* 8.9  MG  --  1.4* 1.4* 2.1 1.7  PHOS  --  3.7 3.0 2.5 2.7   GFR: Estimated Creatinine Clearance: 82 mL/min (A) (by C-G formula based on SCr of 0.36 mg/dL (L)). Liver Function Tests: Recent Labs  Lab 07/27/23 1541 07/31/23 1114 08/01/23 0301 08/02/23 0417  AST 26 25 26 21   ALT 17 13 12 12   ALKPHOS 86 80 79  80  BILITOT 0.8 1.3* 1.5* 1.1  PROT 7.2 7.0 6.6 6.6  ALBUMIN  3.6 3.1* 2.8* 2.8*   No results for input(s): LIPASE, AMYLASE in the last 168 hours. Recent Labs  Lab 07/29/23 0859  AMMONIA 12   Coagulation Profile: Recent Labs  Lab 07/31/23 1816  INR 1.5*   Cardiac Enzymes: No results for input(s): CKTOTAL, CKMB, CKMBINDEX, TROPONINI in the last 168 hours. BNP (last 3 results) No results for input(s): PROBNP in the last 8760 hours. HbA1C: No results for input(s): HGBA1C in the last 72 hours. CBG: Recent Labs  Lab 07/27/23 1556  GLUCAP 74   Lipid Profile: No results for input(s): CHOL, HDL, LDLCALC, TRIG, CHOLHDL, LDLDIRECT in the last 72 hours. Thyroid Function Tests: No results for input(s): TSH, T4TOTAL, FREET4, T3FREE, THYROIDAB in the last 72 hours. Anemia Panel: Recent Labs    08/02/23 0417  VITAMINB12 473  FOLATE 15.0  FERRITIN 128  TIBC 335  IRON  17*  RETICCTPCT 1.3   Sepsis Labs: No results for input(s): PROCALCITON, LATICACIDVEN in the last 168 hours.  Recent Results (from the past 240 hours)  Culture, blood (Routine X 2) w Reflex to ID Panel     Status: None (Preliminary result)   Collection Time: 07/30/23  8:07 PM   Specimen: BLOOD RIGHT ARM  Result Value Ref Range Status   Specimen Description   Final    BLOOD RIGHT ARM AEROBIC BOTTLE ONLY Performed at [facility], [address], [place], [place] [address]    Special Requests   Final    Blood Culture results may not be optimal due to an inadequate volume of blood received in culture bottles BOTTLES DRAWN AEROBIC ONLY Performed at [facility], [address], [place], [place] [address]    Culture   Final    NO GROWTH 2 DAYS Performed at [facility], [address] [place], [place] [address]    Report Status PENDING  Incomplete  Culture, blood (Routine X 2) w Reflex to ID Panel     Status: None  (Preliminary  result)   Collection Time: 07/30/23  8:07 PM   Specimen: BLOOD RIGHT ARM  Result Value Ref Range Status   Specimen Description   Final    BLOOD RIGHT ARM AEROBIC BOTTLE ONLY Performed at [facility], [address], [place], [place] [address]    Special Requests   Final    Blood Culture results may not be optimal due to an inadequate volume of blood received in culture bottles BOTTLES DRAWN AEROBIC ONLY Performed at [facility], [address] [place], [place] [address]    Culture   Final    NO GROWTH 2 DAYS Performed at [facility], [address], [place], [place] [address]    Report Status PENDING  Incomplete    Radiology Studies: DG CHEST PORT 1 VIEW Result Date: 08/01/2023 CLINICAL DATA:  68 year old male with shortness of breath. Status post pulmonary embolus in December. EXAM: PORTABLE CHEST 1 VIEW COMPARISON:  Portable chest yesterday and earlier. FINDINGS: Portable AP semi upright view at 0534 hours. Continued low lung volumes. Mediastinal contours remain normal. No pneumothorax. Mild new veiling opacity at the right lung base. Otherwise stable lung markings, no consolidation or confluent opacity. No convincing acute pulmonary edema. Right pleural effusion by CTA in December. Left upper quadrant surgical clips. Paucity of upper abdominal bowel gas. Stable visualized osseous structures. IMPRESSION: Ongoing low lung volumes with veiling right lung base opacity which is probably unresolved right pleural effusion seen in December. No other convincing acute cardiopulmonary abnormality. Electronically Signed   By: VEAR Hurst M.D.   On: 08/01/2023 08:52   Scheduled Meds:  enoxaparin  (LOVENOX ) injection  65 mg Subcutaneous Q12H   feeding supplement  237 mL Oral BID BM   folic acid   1 mg Oral Daily   [START ON 08/03/2023] LORazepam   1 mg Oral Daily   multivitamin with minerals  1 tablet Oral Daily   potassium chloride   40 mEq Oral  Once   [START ON 08/07/2023] thiamine   100 mg Oral Daily   Or   [START ON 08/07/2023] thiamine   100 mg Intravenous Daily   Continuous Infusions:  diltiazem  (CARDIZEM ) infusion Stopped (08/02/23 9362)   piperacillin -tazobactam (ZOSYN )  IV 3.375 g (08/02/23 2106)   thiamine  (VITAMIN B1) injection Stopped (08/02/23 0950)   vancomycin  750 mg (08/02/23 2109)    LOS: 4 days   Alejandro Marker, DO [facility] Available via Epic secure chat 7am-7pm After these hours, please refer to coverage provider listed on [email or web address] 08/02/2023, 9:12 PM

## 2023-08-02 NOTE — Plan of Care (Addendum)
 Patient Anthony [name] 1, alertness waxes/wanes with levels of anxiety/agitation. Primafit in place. PO intake minimal today. Meds given with bites of applesauce, patient upright and frequent queuing for intake.   Problem: Clinical Measurements: Goal: Ability to maintain clinical measurements within normal limits will improve Outcome: Progressing Goal: Cardiovascular complication will be avoided Outcome: Progressing   Problem: Nutrition: Goal: Adequate nutrition will be maintained Outcome: Progressing   Problem: Elimination: Goal: Will not experience complications related to bowel motility Outcome: Progressing   Problem: Education: Goal: Knowledge of General Education information will improve Description: Including pain rating scale, medication(s)/side effects and non-pharmacologic comfort measures Outcome: Not Progressing   Problem: Coping: Goal: Level of anxiety will decrease Outcome: Not Progressing

## 2023-08-03 DIAGNOSIS — F10231 Alcohol dependence with withdrawal delirium: Secondary | ICD-10-CM | POA: Diagnosis not present

## 2023-08-03 DIAGNOSIS — I4891 Unspecified atrial fibrillation: Secondary | ICD-10-CM

## 2023-08-03 DIAGNOSIS — R410 Disorientation, unspecified: Secondary | ICD-10-CM | POA: Diagnosis not present

## 2023-08-03 DIAGNOSIS — F1024 Alcohol dependence with alcohol-induced mood disorder: Secondary | ICD-10-CM | POA: Diagnosis not present

## 2023-08-03 DIAGNOSIS — R451 Restlessness and agitation: Secondary | ICD-10-CM | POA: Diagnosis not present

## 2023-08-03 DIAGNOSIS — F1014 Alcohol abuse with alcohol-induced mood disorder: Secondary | ICD-10-CM | POA: Diagnosis not present

## 2023-08-03 LAB — CBC WITH DIFFERENTIAL/PLATELET
Abs Immature Granulocytes: 0.02 10*3/uL (ref 0.00–0.07)
Basophils Absolute: 0.1 10*3/uL (ref 0.0–0.1)
Basophils Relative: 1 %
Eosinophils Absolute: 0.2 10*3/uL (ref 0.0–0.5)
Eosinophils Relative: 4 %
HCT: 33.2 % — ABNORMAL LOW (ref 39.0–52.0)
Hemoglobin: 10.4 g/dL — ABNORMAL LOW (ref 13.0–17.0)
Immature Granulocytes: 1 %
Lymphocytes Relative: 29 %
Lymphs Abs: 1.1 10*3/uL (ref 0.7–4.0)
MCH: 32.2 pg (ref 26.0–34.0)
MCHC: 31.3 g/dL (ref 30.0–36.0)
MCV: 102.8 fL — ABNORMAL HIGH (ref 80.0–100.0)
Monocytes Absolute: 0.5 10*3/uL (ref 0.1–1.0)
Monocytes Relative: 12 %
Neutro Abs: 2.2 10*3/uL (ref 1.7–7.7)
Neutrophils Relative %: 53 %
Platelets: 243 10*3/uL (ref 150–400)
RBC: 3.23 MIL/uL — ABNORMAL LOW (ref 4.22–5.81)
RDW: 17.2 % — ABNORMAL HIGH (ref 11.5–15.5)
WBC: 4 10*3/uL (ref 4.0–10.5)
nRBC: 0 % (ref 0.0–0.2)

## 2023-08-03 LAB — COMPREHENSIVE METABOLIC PANEL
ALT: 13 U/L (ref 0–44)
AST: 24 U/L (ref 15–41)
Albumin: 2.7 g/dL — ABNORMAL LOW (ref 3.5–5.0)
Alkaline Phosphatase: 90 U/L (ref 38–126)
Anion gap: 7 (ref 5–15)
BUN: 10 mg/dL (ref 8–23)
CO2: 18 mmol/L — ABNORMAL LOW (ref 22–32)
Calcium: 8.8 mg/dL — ABNORMAL LOW (ref 8.9–10.3)
Chloride: 110 mmol/L (ref 98–111)
Creatinine, Ser: 0.58 mg/dL — ABNORMAL LOW (ref 0.61–1.24)
GFR, Estimated: >60 mL/min (ref >60)
Glucose, Bld: 101 mg/dL — ABNORMAL HIGH (ref 70–99)
Potassium: 4 mmol/L (ref 3.5–5.1)
Sodium: 135 mmol/L (ref 135–145)
Total Bilirubin: 0.9 mg/dL (ref 0.0–1.2)
Total Protein: 6.5 g/dL (ref 6.5–8.1)

## 2023-08-03 LAB — PHOSPHORUS: Phosphorus: 3.2 mg/dL (ref 2.5–4.6)

## 2023-08-03 LAB — MAGNESIUM: Magnesium: 1.5 mg/dL — ABNORMAL LOW (ref 1.7–2.4)

## 2023-08-03 MED ORDER — METOPROLOL TARTRATE 25 MG PO TABS
25.0000 mg | ORAL_TABLET | Freq: Two times a day (BID) | ORAL | Status: DC
  Administered 2023-08-03: 25 mg via ORAL
  Filled 2023-08-03 (×2): qty 1

## 2023-08-03 MED ORDER — OLANZAPINE 10 MG IM SOLR
2.5000 mg | Freq: Four times a day (QID) | INTRAMUSCULAR | Status: DC | PRN
  Administered 2023-08-04 – 2023-08-14 (×9): 2.5 mg via INTRAMUSCULAR
  Filled 2023-08-03 (×18): qty 10

## 2023-08-03 MED ORDER — MAGNESIUM SULFATE 4 GM/100ML IV SOLN
4.0000 g | Freq: Once | INTRAVENOUS | Status: AC
  Administered 2023-08-03: 4 g via INTRAVENOUS
  Filled 2023-08-03: qty 100

## 2023-08-03 NOTE — Consult Note (Signed)
 Cardiology Consultation   Patient ID: Anthony A Vanloan Jr. MRN: [id number]; DOB: Jul 08, 1956  Admit date: 07/28/2023 Date of Consult: 08/03/2023  PCP:  Freddrick Johns   [facility] Providers Cardiologist:  Lonni Cash, MD        Patient Profile:   Anthony [name]. is a 68 y.o. 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  History of Present Illness:   Anthony [name] is a 68 year old male with the above history. He has never actually been seen in our office and has only been seen during prior admissions. He was first diagnosed with atrial fibrillation during an admission for alcohol  withdrawal in 07/2020. Echo at that time showed LVEF of 55-60% with no regional wall motion abnormalities and grade 2 diastolic dysfunction. We then saw him during a repeat admission later that month for recurrent atrial fibrillation in setting of severe sepsis. He was started on Amiodarone  and converted back to sinus rhythm. Coronary CTA in 09/2020 showed a coronary calcium  score of 884 (91st percentile for age and sex) with moderate non-obstructive CAD.  He has frequent admission since that time for a variety of issues. He had 8 admissions in 2024 alone for aspiration pneumonia, acute on chronic CHF, C. Diff, SIRS, pneumothorax requiring chest tube, acute metabolic encephalopathy secondary to alcohol  use/ withdrawal, and acute PE. He frequently has atrial fibrillation with RVR during these admission often due non-compliance or underlying illnesses such as infection or alcohol  abuse/ withdrawal.  He was most recently admitted from 07/07/2023 to  07/12/2023 for acute PE. He had previously been diagnosed with an acute PE in 01/2023 and had been non-compliant with anticoagulation. Echo showed LVEF of 45-50% with hypokinesis of the inferior wall, moderately enlarged RV with mildly reduced RV function, mild to moderate MR. EF had slightly improved from 40-45% in 01/2023. He was initially started on IV Heparin  and then transitioned to Eliquis . He was treated with Librium  during admission due to severe alcohol  use disorder. He was noted to be in atrial fibrillation with RVR on admission in the setting of medication non-compliance. He was restarted on PO medications with improvement in rates.  He has been seen in the ED multiple times since then for cold exposure and requesting food.  He presented to the [facility] ED on 07/28/2023 again due to homelessness but appear delirious and was not felt to be safe for discharge. Therefore, he was admitted. Delirium was felt to be due dementia from significant alcohol  use vs alcohol  withdrawal. Hospitalization has been complicated by persistent confusion, fever (unclear etiology), and atrial fibrillation with RVR. EEG was suggestive of mild diffuse encephalopathy. Psychiatry is following. He was initially started on IV Diltiazem  for his atrial fibrillation but then became hypotensive with this. Cardiology consulted for assistance.  At the time of this evaluation, patient is very lethargic and disoriented. He is in mittens. He will  responds when I ask him a questions but I am unable to make out what he is saying.   Past Medical History:  Diagnosis Date   Actinic keratosis 11/27/2016   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) 07/18/2018   Alcohol   use disorder, severe, dependence (HCC) 09/16/2006   05/22/2015 Argumentative, belligerent and verbally abusive to staff per his note from [place]     Alcoholism (HCC)    Anxiety state 04/25/2018   Aortic atherosclerosis (HCC) 08/27/2022   Chronic low  back pain 09/16/2006   Followed by NS.  Per his chart from [place] , he fell off two storyhouse roof while cleaning his brother's gutters and sustained compression fracture of L3, L4 and L5 and fracture of right transverse process at L3 and nondisplaced fracture of left glenoid rim many years ago. 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 And 12/2017 admissions    History of pulmonary embolus (PE) 11/02/2016   Unprovoked 11/01/2016. Xarelto  from 11/01/16 to 09/08/2017.   Hydrocele    Surgically corrected   Hypertension    Hypoalbuminemia due to protein-calorie malnutrition (HCC) 07/22/2020   Lumbar compression fracture (HCC)    Multiple rib fractures 07/22/2019   Left rib details XR: left ribs demonstrate multiple remote rib   Pancytopenia (HCC)    Rhabdomyolysis 07/22/2020   Skin cancer    exciced 2017   Tinea versicolor 06/08/2013   Tobacco use disorder 07/22/2020   Tooth infection 04/25/2018   Trigger finger, acquired 11/18/2012   Ulnar tunnel syndrome of right wrist 11/08/2012    Past Surgical History:  Procedure Laterality Date   BIOPSY  05/21/2022   Procedure: BIOPSY;  Surgeon: Charlanne Groom, MD;  Location: [facility] ENDOSCOPY;  Service: Gastroenterology;;   CATARACT EXTRACTION     ESOPHAGOGASTRODUODENOSCOPY (EGD) WITH PROPOFOL  N/A 05/21/2022   Procedure: ESOPHAGOGASTRODUODENOSCOPY (EGD) WITH PROPOFOL ;  Surgeon: Charlanne Groom, MD;  Location: [facility] ENDOSCOPY;  Service: Gastroenterology;  Laterality: N/A;   FOREIGN BODY REMOVAL N/A 05/21/2022   Procedure: FOREIGN BODY REMOVAL;  Surgeon: Charlanne Groom,  MD;  Location: [facility] ENDOSCOPY;  Service: Gastroenterology;  Laterality: N/A;   HIATAL HERNIA REPAIR  2016   HYDROCELE EXCISION / REPAIR  2010   SKIN CANCER EXCISION Left    Excised 2017     Home Medications:  Prior to Admission medications   Medication Sig Start Date End Date Taking? Authorizing Provider  acetaminophen  (TYLENOL ) 500 MG tablet Take 1 tablet (500 mg total) by mouth every 8 (eight) hours as needed. Patient taking differently: Take 1,000 mg by mouth every 6 (six) hours as needed (for pain). 10/13/22   Gherghe, Costin M, MD  apixaban  (ELIQUIS ) 5 MG TABS tablet Take 2 tablets daily (10 mg total) twice daily for 4 days then on Dec 27, reduce to 1 tablet (5 mg total) twice daily. 07/12/23   Danford, Lonni SQUIBB, MD  ferrous sulfate  325 (65 FE) MG tablet Take 1 tablet (325 mg total) by mouth daily with breakfast. 07/12/23 08/11/23  Danford, Lonni SQUIBB, MD  folic acid  (FOLVITE ) 1 MG tablet Take 1 tablet (1 mg total) by mouth daily. 07/12/23   Danford, Lonni SQUIBB, MD  magnesium  oxide (MAG-OX) 400 (240 Mg) MG tablet Take 1 tablet (400 mg total) by mouth 2 (two) times daily. 02/16/23   Singh, Prashant K, MD  metoprolol  tartrate (LOPRESSOR ) 25 MG tablet Take 1 tablet (25 mg  total) by mouth 2 (two) times daily. 07/12/23 10/10/23  DanfordLonni SQUIBB, MD  mometasone -formoterol  (DULERA ) 200-5 MCG/ACT AERO Inhale 2 puffs into the lungs 2 (two) times daily. 02/16/23   Singh, Prashant K, MD  pantoprazole  (PROTONIX ) 40 MG tablet Take 1 tablet (40 mg total) by mouth daily. 02/17/23   Singh, Prashant K, MD  rosuvastatin  (CRESTOR ) 10 MG tablet Take 1 tablet (10 mg total) by mouth daily. 07/12/23 10/10/23  DanfordLonni SQUIBB, MD  thiamine  (VITAMIN B1) 100 MG tablet Take 1 tablet (100 mg total) by mouth daily. 07/12/23   DanfordLonni SQUIBB, MD    Inpatient Medications: Scheduled Meds:  enoxaparin  (LOVENOX ) injection  65 mg Subcutaneous Q12H   feeding supplement  237 mL Oral BID BM   folic  acid  1 mg Oral Daily   LORazepam   1 mg Oral Daily   multivitamin with minerals  1 tablet Oral Daily   potassium chloride   40 mEq Oral Once   [START ON 08/07/2023] thiamine   100 mg Oral Daily   Or   [START ON 08/07/2023] thiamine   100 mg Intravenous Daily   Continuous Infusions:  diltiazem  (CARDIZEM ) infusion Stopped (08/02/23 9362)   magnesium  sulfate bolus IVPB     piperacillin -tazobactam (ZOSYN )  IV 3.375 g (08/03/23 0422)   thiamine  (VITAMIN B1) injection Stopped (08/02/23 0950)   vancomycin  Stopped (08/02/23 2209)   PRN Meds: acetaminophen  **OR** acetaminophen , hydrALAZINE , ipratropium-albuterol , metoprolol  tartrate, OLANZapine , ondansetron  (ZOFRAN ) IV  Allergies:    Allergies  Allergen Reactions   Other Itching and Other (See Comments)    Seasonal allergies- Itchy eyes, runny nose, congestion   Poison Ivy Extract Rash    Social History:   Social History   Socioeconomic History   Marital status: Single    Spouse name: Not on file   Number of children: Not on file   Years of education: Not on file   Highest education level: Not on file  Occupational History   Occupation: unemployed  Tobacco Use   Smoking status: Never   Smokeless tobacco: Current    Types: Snuff   Tobacco comments:    Pt states do snuff when stressed had 1 can last 3 month//PL  Vaping Use   Vaping status: Never Used  Substance and Sexual Activity   Alcohol  use: Yes    Alcohol /week: 43.0 standard drinks of alcohol     Types: 42 Cans of beer, 1 Standard drinks or equivalent per week   Drug use: Yes    Types: Marijuana   Sexual activity: Yes    Birth control/protection: None  Other Topics Concern   Not on file  Social History Narrative   Lives with a friend. Has children but hasn't met them in a while. He doesn't know where they live.    Social Drivers of Corporate Investment Banker Strain: Not on file  Food Insecurity: Patient Unable To Answer (07/28/2023)   Hunger Vital Sign    Worried  About Running Out of Food in the Last Year: Patient unable to answer    Ran Out of Food in the Last Year: Patient unable to answer  Recent Concern: Food Insecurity - Food Insecurity Present (07/08/2023)   Hunger Vital Sign    Worried About Running Out of Food in the Last Year: Often true    Ran Out of Food in the Last Year: Often true  Transportation Needs: Patient Unable To Answer (07/28/2023)   PRAPARE - Administrator, Civil Service (Medical):  Patient unable to answer    Lack of Transportation (Non-Medical): Patient unable to answer  Recent Concern: Transportation Needs - Unmet Transportation Needs (07/08/2023)   PRAPARE - Administrator, Civil Service (Medical): Yes    Lack of Transportation (Non-Medical): Yes  Physical Activity: Not on file  Stress: Not on file  Social Connections: Patient Unable To Answer (07/28/2023)   Social Connection and Isolation Panel [NHANES]    Frequency of Communication with Friends and Family: Patient unable to answer    Frequency of Social Gatherings with Friends and Family: Patient unable to answer    Attends Religious Services: Patient unable to answer    Active Member of Clubs or Organizations: Patient unable to answer    Attends Banker Meetings: Patient unable to answer    Marital Status: Patient unable to answer  Intimate Partner Violence: Patient Unable To Answer (07/28/2023)   Humiliation, Afraid, Rape, and Kick questionnaire    Fear of Current or Ex-Partner: Patient unable to answer    Emotionally Abused: Patient unable to answer    Physically Abused: Patient unable to answer    Sexually Abused: Patient unable to answer    Family History:   Family History  Problem Relation Age of Onset   Heart attack Father        died at 42 years   Dementia Father    Stroke Father    Cancer Sister    Colon cancer Neg Hx    Colon polyps Neg Hx    Esophageal cancer Neg Hx    Stomach cancer Neg Hx    Rectal cancer Neg Hx       ROS:  Please see the history of present illness.  Review of Systems  Unable to perform ROS: Mental status change (delirium)   Physical Exam/Data:   Vitals:   08/02/23 2200 08/02/23 2238 08/03/23 0346 08/03/23 0500  BP:   115/77   Pulse: (!) 120 92    Resp:      Temp:    97.7 F (36.5 C)  TempSrc:    Oral  SpO2:      Weight:      Height:        Intake/Output Summary (Last 24 hours) at 08/03/2023 0904 Last data filed at 08/03/2023 0500 Gross per 24 hour  Intake 1414.35 ml  Output 2200 ml  Net -785.65 ml      07/28/2023    9:59 PM 07/12/2023    3:44 AM 07/11/2023    5:00 AM  Last 3 Weights  Weight (lbs) 142 lb 10.2 oz 147 lb 14.9 oz 150 lb 12.7 oz  Weight (kg) 64.7 kg 67.1 kg 68.4 kg     Body mass index is 21.69 kg/m.  General: 68 y.o. Caucasian male. Lethargic and disoriented. HEENT: Normocephalic and atraumatic. Sclera clear.  Neck: Supple. No JVD. Heart: Tachycardic with irregularly irregularly rhythm. No murmurs, gallops, or rubs. Lungs: No increased work of breathing. Clear to ausculation bilaterally. No wheezes, rhonchi, or rales.  Extremities: No lower extremity edema.    Skin: Warm and dry. Neuro: Lethargic and disoriented. Psych: Lethargic and disoriented. Does not respond appropriately.  EKG:  The EKG was personally reviewed and demonstrates:  Atrial fibrillation, rate 93 bpm, with PVC and non-specific T wave changes. Telemetry:  Telemetry was personally reviewed and demonstrates:  Atrial fibrillation with rates in the 100s to 130s.   Relevant CV Studies:  Echocardiogram 07/08/2023: Impressions: 1. Hypokinesis of the inferior wall  from the base to the mid wall. Left  ventricular ejection fraction, by estimation, is 45 to 50%. The left  ventricle has mildly decreased function. The left ventricle has no  regional wall motion abnormalities. Left  ventricular diastolic parameters are indeterminate.   2. Right ventricular systolic function is mildly  reduced. The right  ventricular size is moderately enlarged. There is normal pulmonary artery  systolic pressure.   3. Left atrial size was moderately dilated.   4. Right atrial size was mild to moderately dilated.   5. The mitral valve is degenerative. Mild to moderate mitral valve  regurgitation.   6. The aortic valve was not well visualized. Aortic valve regurgitation  is not visualized.   7. The inferior vena cava is normal in size with <50% respiratory  variability, suggesting right atrial pressure of 8 mmHg.    Laboratory Data:  High Sensitivity Troponin:   Recent Labs  Lab 07/07/23 1955 07/07/23 2300 07/30/23 2007 07/30/23 2126  TROPONINIHS 14 11 8 8      Chemistry Recent Labs  Lab 08/01/23 0301 08/02/23 0417 08/03/23 0412  NA 129* 135 135  K 4.2 3.7 4.0  CL 104 111 110  CO2 16* 18* 18*  GLUCOSE 100* 96 101*  BUN 12 11 10   CREATININE 0.52* 0.36* 0.58*  CALCIUM  8.3* 8.9 8.8*  MG 2.1 1.7 1.5*  GFRNONAA >60 >60 >60  ANIONGAP 9 6 7     Recent Labs  Lab 08/01/23 0301 08/02/23 0417 08/03/23 0412  PROT 6.6 6.6 6.5  ALBUMIN  2.8* 2.8* 2.7*  AST 26 21 24   ALT 12 12 13   ALKPHOS 79 80 90  BILITOT 1.5* 1.1 0.9   Lipids No results for input(s): CHOL, TRIG, HDL, LABVLDL, LDLCALC, CHOLHDL in the last 168 hours.  Hematology Recent Labs  Lab 08/01/23 0301 08/02/23 0417 08/03/23 0412  WBC 7.4 5.9 4.0  RBC 3.08* 2.93*  2.94* 3.23*  HGB 10.0* 9.5* 10.4*  HCT 30.2* 29.1* 33.2*  MCV 98.1 99.3 102.8*  MCH 32.5 32.4 32.2  MCHC 33.1 32.6 31.3  RDW 17.8* 17.4* 17.2*  PLT 211 213 243   Thyroid  Recent Labs  Lab 07/29/23 0859  TSH 2.967    BNPNo results for input(s): BNP, PROBNP in the last 168 hours.  DDimer No results for input(s): DDIMER in the last 168 hours.   Radiology/Studies:  DG CHEST PORT 1 VIEW Result Date: 08/01/2023 CLINICAL DATA:  68 year old male with shortness of breath. Status post pulmonary embolus in December. EXAM:  PORTABLE CHEST 1 VIEW COMPARISON:  Portable chest yesterday and earlier. FINDINGS: Portable AP semi upright view at 0534 hours. Continued low lung volumes. Mediastinal contours remain normal. No pneumothorax. Mild new veiling opacity at the right lung base. Otherwise stable lung markings, no consolidation or confluent opacity. No convincing acute pulmonary edema. Right pleural effusion by CTA in December. Left upper quadrant surgical clips. Paucity of upper abdominal bowel gas. Stable visualized osseous structures. IMPRESSION: Ongoing low lung volumes with veiling right lung base opacity which is probably unresolved right pleural effusion seen in December. No other convincing acute cardiopulmonary abnormality. Electronically Signed   By: VEAR Hurst M.D.   On: 08/01/2023 08:52   DG CHEST PORT 1 VIEW Result Date: 07/31/2023 CLINICAL DATA:  Fever, decreased O2 sats EXAM: PORTABLE CHEST 1 VIEW COMPARISON:  07/27/2023 FINDINGS: Heart is mildly enlarged. No overt edema. Bibasilar opacities likely reflect atelectasis. No effusions or pneumothorax. No acute bony abnormality. IMPRESSION: Cardiomegaly. Bibasilar atelectasis. Electronically Signed  By: Franky Crease M.D.   On: 07/31/2023 08:45   MR BRAIN WO CONTRAST Result Date: 07/30/2023 CLINICAL DATA:  Altered mental status EXAM: MRI HEAD WITHOUT CONTRAST TECHNIQUE: Multiplanar, multiecho pulse sequences of the brain and surrounding structures were obtained without intravenous contrast. COMPARISON:  None Available. FINDINGS: Evaluation is limited by motion artifact. The exam could not be completed due to the patient's inability to cooperate. Brain: No restricted diffusion to suggest acute or subacute infarct. No acute hemorrhage, mass, mass effect, or midline shift. No hydrocephalus or significant extra-axial collection. Vascular: Normal arterial flow voids. Skull and upper cervical spine: Normal marrow signal. Sinuses/Orbits: Clear paranasal sinuses. No acute finding in  the orbits. Status post bilateral lens replacements. Other: Trace fluid in the mastoid air cells. IMPRESSION: Evaluation is limited by motion artifact and the patient's inability to complete the exam. Within this limitation, no acute intracranial process. No evidence of acute or subacute infarct. Electronically Signed   By: Donald Campion M.D.   On: 07/30/2023 19:36   DG Abd 1 View Result Date: 07/30/2023 CLINICAL DATA:  [id number] Abdominal discomfort [id number] EXAM: ABDOMEN - 1 VIEW COMPARISON:  X-ray abdomen 12/02/2022, CT abdomen pelvis 02/03/2023 FINDINGS: Bilateral upper abdomen/hemidiaphragms are collimated off view. Surgical clips overlie the left upper abdomen. The bowel gas pattern is normal. Limited evaluation for free gas on this supine radiograph. No radio-opaque calculi or other significant radiographic abnormality are seen. Vascular calcifications. IMPRESSION: Nonobstructive bowel gas pattern. Bilateral upper abdomen/hemidiaphragms are collimated off view. Electronically Signed   By: Morgane  Naveau M.D.   On: 07/30/2023 16:30   EEG adult Result Date: 07/30/2023 Shelton Arlin KIDD, MD     07/30/2023  1:41 PM Patient Name: Anthony A Pennings Jr. MRN: [id number] Epilepsy Attending: Arlin KIDD Shelton Referring Physician/Provider: Sherrill Alejandro Donovan, DO Date: 07/30/2023 Duration: 25.44 mins Patient history: 68yo M with ams getting eeg to evaluate for seizure Level of alertness: Awake AEDs during EEG study: Ativan  Technical aspects: This EEG study was done with scalp electrodes positioned according to the 10-20 International system of electrode placement. Electrical activity was reviewed with band pass filter of 1-70Hz , sensitivity of 7 uV/mm, display speed of 5mm/sec with a 60Hz  notched filter applied as appropriate. EEG data were recorded continuously and digitally stored.  Video monitoring was available and reviewed as appropriate. Description: The posterior dominant rhythm consists of 7.5 Hz activity of moderate  voltage (25-35 uV) seen predominantly in posterior head regions, symmetric and reactive to eye opening and eye closing. EEG showed intermittent generalized 5 to 6 Hz theta slowing. Physiologic photic driving was not seen during photic stimulation.  \Hyperventilation was not performed.   ABNORMALITY - Intermittent slow, generalized IMPRESSION: This study is suggestive of mild diffuse encephalopathy. No seizures or epileptiform discharges were seen throughout the recording. Priyanka KIDD Shelton     Assessment and Plan:   Persistent Atrial Fibrillation Patient has a long history of atrial fibrillation complicated by non-compliance and severe alcohol  abuse. He was admitted for delirium felt to be secondary to alcohol  abuse vs withdrawal. Hospitalization has been complicated by atrial fibrillation with RVR. He was started on IV Diltiazem  but has had some intermittent hypotension with this. - Currently rates are in the 100s to 110s. However, looks like rates easily spike to the 130s especially when he is agitated. - Potassium 4.0 today.  - Magnesium  1.5 today. Being repleted by primary team. - TSH normal.  - Currently on IV Diltiazem  5mg /hr. BP has improved and systolic  BP in the 130s so continue.  - Will start Lopressor  25mg  twice daily. - Of note, patient has previously required Midodrine  during prior admissions due to hypotension. No need for this at this time. - Suspect severe alcohol  abuse and withdrawal is contributing to RVR. This is overall a very difficult situation given his alcohol  use disorder, non-compliance, and homelessness. Will plan for rate control.  - Currently on anticoagulation with Lovenox  injection. He has been non-compliant with oral anticoagulation in the past. I do have concerns about him being on long-term anticoagulation given his history of severe alcohol  abuse. However, he also was a history of recurrent PE and needs anticoagulation for this as well.  Chronic HFmrEF Echo in  06/2023 during an admission for acute PE showed LVEF of LVEF of 45-50% with hypokinesis of the inferior wall, moderately enlarged RV with mildly reduced RV function, mild to moderate MR. EF had slightly improved from 40-45% in 01/2023.  - Euvolemic on exam.  - OK to continue IV Diltiazem  as above given EF only slightly reduced.  - GDMT limited by hypotension. Will add beta-blocker as above. - Mildly reduced EF may be secondary to acute PE. He was initially diagnosed with this in 01/2023 and then was readmitted for acute PE in 06/2023 in setting of medication non-compliance. He does have a history of non-obstructive CAD on prior coronary CTA in 09/2020. Given his history of medication non-compliance and severe alcohol  abuse, would not pursue additional ischemic evaluation at this time. Would continue to treat medically.   Non-Obstructive CAD Coronary CTA in 09/2020 showed a coronary calcium  score of 884 (91st percentile for age and sex) with moderate non-obstructive CAD. High-sensitivity troponin negative x2. - No aspirin  given need for anticoagulation. - Ideally would be on a statin but think it is unlikely that is will take this on discharge.  Otherwise, per primary team: - Acute metabolic encephalopathy with persistent delirium - Recurrent PE - Fever (unclear etiology)  - Metabolic acidosis - Electrolyte abnormalities  - Hypalbuminemia - Alcohol  abuse - Homelessness         Risk Assessment/Risk Scores:    CHA2DS2-VASc Score = 6  This indicates a 9.7% annual risk of stroke. The patient's score is based upon:   For questions or updates, please contact [facility] Please consult [email or web address] for contact info under    Signed, Shakea Isip E Greyden Besecker, PA-C  08/03/2023 9:04 AM

## 2023-08-03 NOTE — Progress Notes (Signed)
 OT Cancellation Note  Patient Details Name: Anthony A Crutcher Jr. MRN: [id number] DOB: [birth year]   Cancelled Treatment:     Unable to arouse enogh to participate.  Per chart review, looks like pt was awake most of night.  Pt has been evaluated with rec for SNF.  Acute Rehab Team to continue to follow. Katheryn Leap  PTA Acute  Rehabilitation Services Office M-F          [phone]

## 2023-08-03 NOTE — Progress Notes (Signed)
 Occupational Therapy Treatment Patient Details Name: Anthony A Tetro Jr. MRN: [id number] DOB: 16-Dec-1955 Today's Date: 08/03/2023   History of present illness Pt is a 68 y/o M who presented to the hospital on 07/28/23 after being kicked out of a shelter 2/2 bothering others there, EMS was called & pt was brought back to the ER. Pt has had multiple ER visits since the end of December c/o of being cold, erratic behavior & then leaving AMA. PMH: alcohol  abuse & prior DTs, multiple PE, homelessness, anxiety, depression, chronic LBP, hiatal hernia, hydrocele   OT comments  Pt initially agreeable to OOB activity, required increase time to initiate sititng EOB. Pt sitting halfway up to participate in grooming and UB dressing as pt refusing to sit properly a EOB, then became easily distracted and therapist unable to redirect. Pt confused and with non sensical speech. Pt attempting to get OOB without assist. Pt's nurse and OT eventually able to get pt to return to supine. OT will continue to follow acutely to maximize level of function and safety      If plan is discharge home, recommend the following:  A [place] help with walking and/or transfers;A [place] help with bathing/dressing/bathroom;Assistance with cooking/housework;Assist for transportation   Equipment Recommendations  Other (comment) (TBD)    Recommendations for Other Services      Precautions / Restrictions Precautions Precautions: Fall;Other (comment) Precaution Comments: behaviors Restrictions Weight Bearing Restrictions Per Provider Order: No       Mobility Bed Mobility Overal bed mobility: Needs Assistance Bed Mobility: Supine to Sit     Supine to sit: Supervision, HOB elevated, Used rails     General bed mobility comments: Pt initially agreeable to OOB activity, required increase time to initiate sititng EOB. Pt sititng halfway up then became distracted and therapist unable to redirect. Pt attempting to get OOB without assist.  Pt's nurse and OT eventually able to get pt to return to supine    Transfers                         Balance Overall balance assessment: Needs assistance Sitting-balance support: Feet supported Sitting balance-Leahy Scale: Fair                                     ADL either performed or assessed with clinical judgement   ADL Overall ADL's : Needs assistance/impaired     Grooming: Wash/dry hands;Wash/dry face;Contact guard assist;Sitting           Upper Body Dressing : Contact guard assist;Sitting                     General ADL Comments: Pt often with nonsensical speech, follows commands inconsistently    Extremity/Trunk Assessment Upper Extremity Assessment Upper Extremity Assessment: Generalized weakness   Lower Extremity Assessment Lower Extremity Assessment: Defer to PT evaluation        Vision Baseline Vision/History: 1 Wears glasses Ability to See in Adequate Light: 0 Adequate Patient Visual Report: No change from baseline     Perception     Praxis      Cognition Arousal: Alert Behavior During Therapy: Restless, Agitated, Impulsive Overall Cognitive Status: Impaired/Different from baseline Area of Impairment: Attention, Memory, Following commands, Safety/judgement, Problem solving, Awareness  Following Commands: Follows multi-step commands inconsistently       General Comments: pt required constant redirection, inconsistent with following commands, confused and with non sensical speech        Exercises      Shoulder Instructions       General Comments      Pertinent Vitals/ Pain       Pain Assessment Pain Assessment: No/denies pain Pain Score: 0-No pain Pain Intervention(s): Monitored during session, Repositioned  Home Living                                          Prior Functioning/Environment              Frequency  Min 1X/week         Progress Toward Goals  OT Goals(current goals can now be found in the care plan section)  Progress towards OT goals: OT to reassess next treatment     Plan      Co-evaluation                 AM-PAC OT 6 Clicks Daily Activity     Outcome Measure   Help from another person eating meals?: None Help from another person taking care of personal grooming?: A [place] Help from another person toileting, which includes using toliet, bedpan, or urinal?: A Schaffert Help from another person bathing (including washing, rinsing, drying)?: A Warren Help from another person to put on and taking off regular upper body clothing?: A Kissling Help from another person to put on and taking off regular lower body clothing?: A [place] 6 Click Score: 19    End of Session    OT Visit Diagnosis: Unsteadiness on feet (R26.81);Muscle weakness (generalized) (M62.81);Other symptoms and signs involving cognitive function   Activity Tolerance Treatment limited secondary to agitation   Patient Left in bed;with call bell/phone within reach;with bed alarm set;with restraints reapplied   Nurse Communication          Time: 8643-8587 OT Time Calculation (min): 16 min  Charges: OT General Charges $OT Visit: 1 Visit OT Treatments $Therapeutic Activity: 8-22 mins    Jacques Karna Loose 08/03/2023, 3:03 PM

## 2023-08-03 NOTE — Progress Notes (Signed)
 PROGRESS NOTE    Anthony A Jakubowicz Jr.  FMW:[id number] DOB: 1956/03/07 DOA: 07/28/2023 PCP: Pcp, No   Brief Narrative:  The patient is a 68 year old with history of severe alcohol  use with frequent withdrawal and delirium in the past, paroxysmal A-fib not on anticoagulation, HLD, HTN, recurrent PE, hiatal hernia, homelessness, and iron  deficiency comes to the hospital from a shelter due to delirium.  Continues to be confused there is concern for alcohol  withdrawal and encephalopathy workup is underway and patient went into A-fib with RVR overnight and is now on a diltiazem  drip.  Subsequently he also spiked a temperature and became tachycardic so we will need to work him up for infectious etiology and are checking blood cultures, urinalysis and urine culture and a chest x-ray.  Continues to be encephalopathic but was Roache bit more awake but more confused today.  So we will discontinue IV lorazepam .  We discussed with neurology who recommends if the patient does not improve tomorrow we will get a LP but Neurology recommends changing the cefepime  to Zosyn  given his encephalopathy.  He is Charette bit more awake and alert yesterday and was and oriented x 1.  Was more interactive and wanting to get out of the bed but today on my examination is more somnolent and drowsy.  Heart rates are uncontrolled so cardiology been consulted for further evaluation.  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   -Currently on as needed Zyprexa  and Ativan .   -Ammonia and B12 are normal, TSH is as below.   -For now placed him on high-dose thiamine  and his procedure 9 doses.   -CT  of the head is negative.  Currently in restraints as he poses danger to self and others. -His confusion and encephalopathy today we will check an MRI and EEG; given that went into A-fib with RVR concern he may have had a stroke given his intermittent usage of Eliquis  -MRI done and showed -EEG done and showed This study is suggestive of mild diffuse encephalopathy. No seizures or epileptiform discharges were seen throughout the recording. -C/w CIWA Protocol but discontinued IV lorazepam  given that he is extreme encephalopathic today and somnolent and drowsy -Have consulted psychiatry as he may require chronic medication and they feel that he does not meet inpatient hospitalization at this time but they have initiated Remeron  7.5 mg at bedtime for sleep and MDD and recommending continuing CIWA and Ativan  detox protocol and recommending continue IV thiamine  for 9 doses.  Patient has also not been initiated on a lorazepam  taper per psychiatry -Continues to be encephalopathic so discussed with neurology who recommends that if he continues to be encephalopathic obtain an LP in the morning but recommends changing IV cefepime  to IV Zosyn  -SLP evaluated and his fatigue and lethargy limited his participation yesterday and diet has been changed to a full liquid diet today given her assessment and had closest swallow precautions including to only feed the patient if he is fully alert and able to clear his mouth -Yesterday he was alert and oriented x 1 but today he continues to be extremely confused and somnolent on my evaluation but contacts evaluation he was agitated and tried to climb out  of the bed and continued to be incoherent and mumbled words -Psychiatry continue IV thiamine  and have now reduced his IM Zyprexa  every 6 as needed 5 mg to 2.5 mg due to delirium and need for EKG  Recurrent Pulmonary Embolism -Continude Apixaban  had changed to heparin  drip given that he is not taking very much oral intake and  now we have changed to full dose Lovenox  given his encephalopathy  Fever, improving  -Unclear etiology but improving  -Will check blood cultures x 2, urinalysis and urine culture and chest x-ray -UA is negative and blood cultures pending -Chest x-ray done and showed Heart is mildly enlarged. No overt edema. Bibasilar opacities likely reflect atelectasis. No effusions or pneumothorax. No acute bony abnormality. -Treated with supportive care and IV fluid hydration has now stopped -Initiated empiric antibiotics with IV Cefepime  and IV Vancomycin  and changed IV cefepime  to IV Zosyn  given his continued encephalopathy -Today is day 5 of antibiotics and will stop after today  -Chest x-ray done the day before yesterday showed  Ongoing low lung volumes with veiling right lung base opacity which is probably unresolved right pleural effusion seen in December. No other convincing acute cardiopulmonary abnormality. -Will repeat chest x-ray in a.m.  Essential Hypertension -Continue with IV Hydralazine  10 mg Q4 as needed for systolic blood pressure greater than 180 -Currently on a diltiazem  drip for his A-fib with RVR -Continue to Monitor BP per Protocol -Last BP reading was 106/70  Paroxysmal Atrial Fibrillation now with RVR -Metoprolol  twice now stopped and placed on a diltiazem  drip.  IV as needed.   -Given his encephalopathy changed him to heparin  drip which is now being changed to a Lovenox  injections but the patient is not a long-term anticoagulation candidate -Recent ECHO done 3 weeks ago showed Hypokinesis of the inferior wall from the base to the mid wall. Left ventricular ejection fraction, by estimation, is 45 to 50%. The left ventricle has mildly decreased function. The left ventricle has no regional wall motion abnormalities. Left ventricular diastolic parameters are indeterminate. Right ventricular systolic function is mildly reduced. The right ventricular size is moderately enlarged. There  is normal pulmonary artery  systolic pressure.  -Check Troponin I x2 -IV hydration has now stopped -Continues to have some uncontrolled heart rates with spikes in the 130s usually is agitated.  Currently on IV diltiazem  drip and now cardiology is also started Lopressor  12 5 mg twice daily -TSH was 2.967  Chronic systolic CHF with moderately reduced ejection fraction of 40 to 45% with global hypokinesis -Recent echo as above -Currently appears euvolemic and will dry initially and get IV fluid hydration -Cardiology evaluating and recommend continue IV diltiazem  drip for now and feel that his GDMT is limited by hypotension -Cardiology recommended continuing to treat medically  Hypomagnesemia -Patient's Mag Level Trend: Recent Labs  Lab 07/11/23 0730 07/12/23 0246 07/30/23 0403 07/31/23 1114 08/01/23 0301 08/02/23 0417 08/03/23 0412  MG 1.4* 2.1 1.4* 1.4* 2.1 1.7 1.5*  -Replete with IV Mag Sulfate 4 g today -Continue to Monitor and Replete as Necessary -Repeat Mag in the AM   Hypokalemia -Patient's K+ Level Trend: Recent Labs  Lab 07/14/23 2321 07/27/23 1541 07/30/23 0403 07/31/23 1114 08/01/23 0301 08/02/23 0417 08/03/23 0412  K 3.8 3.7 3.7 3.4* 4.2 3.7 4.0  -Continue to Monitor and Replete as Necessary -Repeat CMP in the AM   Hyponatremia -Na+ Trend: Recent Labs  Lab 07/14/23 2321 07/27/23 1541 07/30/23 0403 07/31/23 1114 08/01/23 0301 08/02/23 0417 08/03/23 9587  NA 140 141 136 127* 129* 135 135  -IV fluid hydration now stopped Will need to continue to monitor and trend and repeat CMP in a.m.  Metabolic Acidosis -Mild and worsening. CO2 is now 18, AG is 7, Chloride Level is 110 -IV fluid hydration has now stopped -Continue to Monitor and Trend and repeat CMP in the AM  Hyperlipidemia -Hold Statin but resume when oral intake is better  Macrocytic Anemia -Hgb/Hct Trend: Recent Labs  Lab 07/14/23 2321 07/27/23 1541 07/30/23 0403 07/31/23 0512  08/01/23 0301 08/02/23 0417 08/03/23 0412  HGB 9.0* 9.1* 9.9* 11.0* 10.0* 9.5* 10.4*  HCT 28.8* 29.4* 31.2* 33.9* 30.2* 29.1* 33.2*  MCV 102.5* 105.4* 103.7* 100.9* 98.1 99.3 102.8*  -Checked Anemia Panel and showed an iron  level of 17, UIBC 318, TIBC 335, saturation ratios of 5%, ferritin level 128, folate level 15.0, vitamin B12 470. -Continue to Monitor for S/Sx of Bleeding; No overt bleeding noted -Repeat CBC in the AM   Hyperbilirubinemia -Bilirubin Trend: Recent Labs  Lab 07/12/23 1801 07/14/23 2321 07/27/23 1541 07/31/23 1114 08/01/23 0301 08/02/23 0417 08/03/23 0412  BILITOT 0.6 0.7 0.8 1.3* 1.5* 1.1 0.9  -Continue to Monitor and Trend and repeat CMP In the AM  Hypoalbuminemia -Patient's Albumin  Trend: Recent Labs  Lab 07/12/23 1801 07/14/23 2321 07/27/23 1541 07/31/23 1114 08/01/23 0301 08/02/23 0417 08/03/23 0412  ALBUMIN  3.0* 3.1* 3.6 3.1* 2.8* 2.8* 2.7*  -Continue to Monitor and Trend and repeat CMP in the AM   DVT prophylaxis: SCDs Start: 07/28/23 1549    Code Status: Full Code Family Communication: No family present at bedside  Disposition Plan:  Level of care: Progressive Status is: Inpatient Remains inpatient appropriate because: Continues to be Encephalopathic and was a somnolent and drowsy   Consultants:  Cardiology Discussed with Neurology Dr. Voncile Psychiatry  Procedures:  As delineated as above  Antimicrobials:  Anti-infectives (From admission, onward)    Start     Dose/Rate Route Frequency Ordered Stop   08/01/23 1630  piperacillin -tazobactam (ZOSYN ) IVPB 3.375 g  Status:  Discontinued        3.375 g 12.5 mL/hr over 240 Minutes Intravenous Every 8 hours 08/01/23 1537 08/03/23 1430   07/31/23 1000  vancomycin  (VANCOCIN ) 750 mg in sodium chloride  0.9 % 250 mL IVPB  Status:  Discontinued        750 mg 250 mL/hr over 60 Minutes Intravenous Every 12 hours 07/30/23 2034 08/03/23 1430   07/30/23 2100  vancomycin  (VANCOREADY) IVPB 1250  mg/250 mL        1,250 mg 166.7 mL/hr over 90 Minutes Intravenous  Once 07/30/23 2013 07/31/23 0700   07/30/23 2030  ceFEPIme  (MAXIPIME ) 2 g in sodium chloride  0.9 % 100 mL IVPB  Status:  Discontinued        2 g 200 mL/hr over 30 Minutes Intravenous Every 8 hours 07/30/23 2013 08/01/23 1533       Subjective: Seen and examined at bedside and he is more somnolent and drowsy today compared to yesterday but then subsequently became more agitated and left the room.  Continues to be encephalopathic and did not really only wake up for my examination.  Heart rates were uncontrolled so cardiology was consulted.  SLP evaluated and reduced his diet to a full liquid diet.  Psychiatry is also reducing Zyprexa  today.  No other concerns according to them.  Objective: Vitals:   08/03/23 0500 08/03/23 1421 08/03/23 1900 08/03/23 2038  BP:  123/84 106/70   Pulse:  ROLLEN)  107    Resp:  15 (!) 21   Temp: 97.7 F (36.5 C) 97.7 F (36.5 C)  98.3 F (36.8 C)  TempSrc: Oral Oral  Oral  SpO2:      Weight:      Height:        Intake/Output Summary (Last 24 hours) at 08/03/2023 2131 Last data filed at 08/03/2023 1720 Gross per 24 hour  Intake 783.44 ml  Output 1850 ml  Net -1066.56 ml   Filed Weights   07/28/23 2159  Weight: 64.7 kg   Examination: Physical Exam:  Constitutional: Thin chronically ill-appearing Caucasian male who is somnolent and drowsy today and remains confused Respiratory: Diminished to auscultation bilaterally, no wheezing, rales, rhonchi or crackles. Normal respiratory effort and patient is not tachypenic. No accessory muscle use. Unlabored breathing  Cardiovascular: Irregularly Irregular and tachycardic, no murmurs / rubs / gallops. S1 and S2 auscultated. No extremity edema.  Abdomen: Soft, non-tender, non-distended. Bowel sounds positive.  GU: Deferred. Musculoskeletal: No clubbing / cyanosis of digits/nails. No joint deformity upper and lower extremities.  Skin: No rashes,  lesions, ulcers on a limited skin evaluation. No induration; Warm and dry.  Neurologic: Confused and somnolent and drowsy  Psychiatric: Impaired judgment and insight. He is awake or alert   Data Reviewed: I have personally reviewed following labs and imaging studies  CBC: Recent Labs  Lab 07/30/23 0403 07/31/23 0512 08/01/23 0301 08/02/23 0417 08/03/23 0412  WBC 4.5 8.5 7.4 5.9 4.0  NEUTROABS  --   --  5.5 3.8 2.2  HGB 9.9* 11.0* 10.0* 9.5* 10.4*  HCT 31.2* 33.9* 30.2* 29.1* 33.2*  MCV 103.7* 100.9* 98.1 99.3 102.8*  PLT 231 204 211 213 243   Basic Metabolic Panel: Recent Labs  Lab 07/30/23 0403 07/31/23 1114 08/01/23 0301 08/02/23 0417 08/03/23 0412  NA 136 127* 129* 135 135  K 3.7 3.4* 4.2 3.7 4.0  CL 105 101 104 111 110  CO2 22 18* 16* 18* 18*  GLUCOSE 84 109* 100* 96 101*  BUN 15 11 12 11 10   CREATININE 0.65 0.32* 0.52* 0.36* 0.58*  CALCIUM  8.5* 8.4* 8.3* 8.9 8.8*  MG 1.4* 1.4* 2.1 1.7 1.5*  PHOS 3.7 3.0 2.5 2.7 3.2   GFR: Estimated Creatinine Clearance: 82 mL/min (A) (by C-G formula based on SCr of 0.58 mg/dL (L)). Liver Function Tests: Recent Labs  Lab 07/31/23 1114 08/01/23 0301 08/02/23 0417 08/03/23 0412  AST 25 26 21 24   ALT 13 12 12 13   ALKPHOS 80 79 80 90  BILITOT 1.3* 1.5* 1.1 0.9  PROT 7.0 6.6 6.6 6.5  ALBUMIN  3.1* 2.8* 2.8* 2.7*   No results for input(s): LIPASE, AMYLASE in the last 168 hours. Recent Labs  Lab 07/29/23 0859  AMMONIA 12   Coagulation Profile: Recent Labs  Lab 07/31/23 1816  INR 1.5*   Cardiac Enzymes: No results for input(s): CKTOTAL, CKMB, CKMBINDEX, TROPONINI in the last 168 hours. BNP (last 3 results) No results for input(s): PROBNP in the last 8760 hours. HbA1C: No results for input(s): HGBA1C in the last 72 hours. CBG: No results for input(s): GLUCAP in the last 168 hours. Lipid Profile: No results for input(s): CHOL, HDL, LDLCALC, TRIG, CHOLHDL, LDLDIRECT in the last 72  hours. Thyroid Function Tests: No results for input(s): TSH, T4TOTAL, FREET4, T3FREE, THYROIDAB in the last 72 hours. Anemia Panel: Recent Labs    08/02/23 0417  VITAMINB12 473  FOLATE 15.0  FERRITIN 128  TIBC 335  IRON  17*  RETICCTPCT 1.3   Sepsis Labs: No results for input(s): PROCALCITON, LATICACIDVEN in the last 168 hours.  Recent Results (from the past 240 hours)  Culture, blood (Routine X 2) w Reflex to ID Panel     Status: None (Preliminary result)   Collection Time: 07/30/23  8:07 PM   Specimen: BLOOD RIGHT ARM  Result Value Ref Range Status   Specimen Description   Final    BLOOD RIGHT ARM AEROBIC BOTTLE ONLY Performed at [facility], [address], [place], [place] [address]    Special Requests   Final    Blood Culture results may not be optimal due to an inadequate volume of blood received in culture bottles BOTTLES DRAWN AEROBIC ONLY Performed at [facility], [address] [place], [place] [address]    Culture   Final    NO GROWTH 3 DAYS Performed at [facility], [address] [place], [place] [address]    Report Status PENDING  Incomplete  Culture, blood (Routine X 2) w Reflex to ID Panel     Status: None (Preliminary result)   Collection Time: 07/30/23  8:07 PM   Specimen: BLOOD RIGHT ARM  Result Value Ref Range Status   Specimen Description   Final    BLOOD RIGHT ARM AEROBIC BOTTLE ONLY Performed at [facility], [address] [place], [place] [address]    Special Requests   Final    Blood Culture results may not be optimal due to an inadequate volume of blood received in culture bottles BOTTLES DRAWN AEROBIC ONLY Performed at [facility], [address], [place], [place] [address]    Culture   Final    NO GROWTH 3 DAYS Performed at [facility], [address], [place], [place] [address]    Report Status PENDING  Incomplete    Radiology  Studies: No results found.  Scheduled Meds:  enoxaparin  (LOVENOX ) injection  65 mg Subcutaneous Q12H   feeding supplement  237 mL Oral BID BM   folic acid   1 mg Oral Daily   metoprolol  tartrate  25 mg Oral BID   multivitamin with minerals  1 tablet Oral Daily   potassium chloride   40 mEq Oral Once   [START ON 08/07/2023] thiamine   100 mg Oral Daily   Or   [START ON 08/07/2023] thiamine   100 mg Intravenous Daily   Continuous Infusions:  diltiazem  (CARDIZEM ) infusion 7.5 mg/hr (08/03/23 1714)   thiamine  (VITAMIN B1) injection 500 mg (08/03/23 1303)    LOS: 5 days   Alejandro Marker, DO [facility] Available via Epic secure chat 7am-7pm After these hours, please refer to coverage provider listed on [email or web address] 08/03/2023, 9:31 PM

## 2023-08-03 NOTE — Progress Notes (Signed)
 Clinical/Bedside Swallow Evaluation Patient Details  Name: Anthony A Humphrey Jr. 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  Per his chart from [place] , he fell off two storyhouse roof while cleaning his brother's gutters and sustained compression fracture of L3, L4 and L5 and fracture of right transverse process at L3 and nondisplaced fracture of left glenoid rim many years ago. 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 And 12/2017 admissions    History of pulmonary embolus (PE) 11/02/2016   Unprovoked 11/01/2016. Xarelto  from 11/01/16 to 09/08/2017.   Hydrocele    Surgically corrected   Hypertension    Hypoalbuminemia due to protein-calorie malnutrition (HCC) 07/22/2020   Lumbar compression fracture (HCC)    Multiple rib fractures 07/22/2019   Left rib details XR: left ribs demonstrate multiple remote rib   Pancytopenia (HCC)    Rhabdomyolysis 07/22/2020   Skin cancer    exciced 2017   Tinea  versicolor 06/08/2013   Tobacco use disorder 07/22/2020   Tooth infection 04/25/2018   Trigger finger, acquired 11/18/2012   Ulnar tunnel syndrome of right wrist 11/08/2012   Past Surgical History:  Past Surgical History:  Procedure Laterality Date   BIOPSY  05/21/2022   Procedure: BIOPSY;  Surgeon: Charlanne Groom, MD;  Location: [facility] ENDOSCOPY;  Service: Gastroenterology;;   CATARACT EXTRACTION     ESOPHAGOGASTRODUODENOSCOPY (EGD) WITH PROPOFOL  N/A 05/21/2022   Procedure: ESOPHAGOGASTRODUODENOSCOPY (EGD) WITH PROPOFOL ;  Surgeon: Charlanne Groom, MD;  Location: [facility] ENDOSCOPY;  Service: Gastroenterology;  Laterality: N/A;   FOREIGN BODY REMOVAL N/A 05/21/2022   Procedure: FOREIGN BODY REMOVAL;  Surgeon: Charlanne Groom, MD;  Location: [facility] ENDOSCOPY;  Service: Gastroenterology;  Laterality: N/A;   HIATAL HERNIA REPAIR  2016   HYDROCELE EXCISION / REPAIR  2010   SKIN CANCER EXCISION Left    Excised 2017   HPI:  Patient is a 68 year old male admitted to [facility] after being removed from homeless shelter due to bizarre behavior.  He has severe dementia and alcohol  use.  Chest x-ray shows ongoing low lung volumes with feeling right lung base opacity which is probably unresolved right pleural effusion seen in December.  No other convincing acute cardiopulmonary abnormalities.  Patient has history of foreign body lodging in distal esophagus in November 2023.  Swallow evaluation was ordered.    Assessment / Plan / Recommendation  Clinical Impression  Patient greeted in bed with open mouth posture and oral cavity full of breakfast items including eggs, bacon, grits.  SLP emergently set up suction and cleared  his mouth requiring extra time after patient would not expectorate into tissue or washcloth with total cues.  Provided him with tip of straw amount of water  with him producing delayed swallow but no overt indication of aspiration.  Informed RN of patient's mouth being full of food.  Change diet to full  liquid and posted swallow precautions sign including to only feed patient if fully alert and assure he has cleared his mouth.  Of note patient has remote history of solid lodging in distal esophagus.  Recommend strtict aspiration precautions. SLP Visit Diagnosis: Dysphagia, oropharyngeal phase (R13.12)    Aspiration Risk  Severe aspiration risk;Risk for inadequate nutrition/hydration    Diet Recommendation  (full liquids)    Liquid Administration via: Straw;Cup Medication Administration: Via alternative means Supervision: Patient able to self feed Compensations: Minimize environmental distractions;Slow rate;Small sips/bites Postural Changes: Seated upright at 90 degrees;Remain upright for at least 30 minutes after po intake    Other  Recommendations Oral Care Recommendations: Oral care BID Caregiver Recommendations: Have oral suction available    Recommendations for follow up therapy are one component of a multi-disciplinary discharge planning process, led by the attending physician.  Recommendations may be updated based on patient status, additional functional criteria and insurance authorization.  Follow up Recommendations No SLP follow up      Assistance Recommended at Discharge  N/a  Functional Status Assessment Patient has had a recent decline in their functional status and demonstrates the ability to make significant improvements in function in a reasonable and predictable amount of time.  Frequency and Duration min 2x/week  2 weeks       Prognosis Prognosis for improved oropharyngeal function: Fair Barriers to Reach Goals: Behavior      Swallow Study   General HPI: Patient is a 68 year old male admitted to [place] after being removed from homeless shelter due to bizarre behavior.  He has severe dementia and alcohol  use.  Chest x-ray shows ongoing low lung volumes with feeling right lung base opacity which is probably unresolved right pleural effusion seen in December.   No other convincing acute cardiopulmonary abnormalities.  Patient has history of foreign body lodging in distal esophagus in November 2023.  Swallow evaluation was ordered. Type of Study: Bedside Swallow Evaluation Previous Swallow Assessment: November 2023 patient was advised dysphagia 3 diet but declined and wanted regular.  This is after hospital admission with foreign body blockage in distal esophagus. Diet Prior to this Study: Regular;Thin liquids (Level 0) Temperature Spikes Noted: No Respiratory Status: Nasal cannula History of Recent Intubation: No Behavior/Cognition: Lethargic/Drowsy;Doesn't follow directions;Requires cueing Oral Cavity Assessment: Other (comment) (Patient's oral cavity was filled with food including eggs, bacon, grits.  Noted in anterior oral cavity but also right subtle cough.  Patient was not aware to this as he was sound asleep.) Oral Care Completed by SLP: Yes (Oral suction set up and extensive oral care provided.) Oral Cavity - Dentition: Missing dentition;Other (Comment) (few dentition noted only) Vision: Impaired for self-feeding Self-Feeding Abilities: Total assist Patient Positioning: Upright in bed Baseline Vocal Quality: Low vocal intensity Volitional Cough: Cognitively unable to elicit Volitional Swallow: Unable to elicit    Oral/Motor/Sensory Function Overall Oral Motor/Sensory Function: Generalized oral weakness Facial ROM: Within Functional Limits Facial Symmetry: Within Functional Limits Facial Strength: Other (Comment) Facial Sensation: Reduced right;Suspected CN V (Trigeminal) dysfunction Lingual ROM: Reduced right;Reduced left Lingual Symmetry: Within Functional Limits Lingual Strength: Reduced Lingual Sensation: Other (Comment) Velum:  (dnt) Mandible: Other (Comment)   Ice Chips  Ice chips: Not tested   Thin Liquid Thin Liquid: Impaired Presentation: Straw Oral Phase Impairments: Reduced labial seal;Poor awareness of bolus Oral Phase  Functional Implications: Oral holding;Prolonged oral transit Pharyngeal  Phase Impairments: Suspected delayed Swallow    Nectar Thick Nectar Thick Liquid: Not tested   Honey Thick Honey Thick Liquid: Not tested   Puree Puree: Impaired Presentation: Spoon Oral Phase Impairments: Poor awareness of bolus;Reduced lingual movement/coordination;Reduced labial seal Oral Phase Functional Implications: Oral holding (pt did not elicit swallow - slp orally suctioned to remove)   Solid     Solid: Not tested      Nicolas Emmie Caldron 08/03/2023,12:36 PM   Madelin POUR, MS [facility] SLP Acute Rehab Services Office [phone]

## 2023-08-03 NOTE — Consult Note (Signed)
 Face-to-Face Psychiatry Consult   Reason for Consult:  significant recurrent agitation. Chronic. Would benefit from chronic medications Referring Physician:  Dr. Caleen  Patient Identification: Anthony [name]. 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 is a 68y/o male with a psychiatric history significant for alcohol  use disorder, depression and anxiety who presents to the ED with complaint of AMS.   08/03/2023 Upon assessment client laying in bed with feet over the rails. Remains in bilateral mitts due to pulling at lines and other equipment. Client was incoherent and mumbled words. Unable to complete comprehensive assessment due to altered mental status.  Zyprexa  reduced related to delirium and need for a new EKG, ordered.  08/02/2023 Upon assessment [name] was in the room feeding him applesauce. Client remains in bilateral mitt restraints due to pulling at lines/ equipment.  Nurse reported after 0800 he began calling into the hallway that he is trapped in bed. At this time scheduled PO Ativan  was administered versus the Zyprexa . Client was sleeping and unable to answer questions. His nurse reports he is A&Ox1 and makes incoherent sentences. Additionally, he stated the client got very Moe sleep last night and does not have any sleep aids ordered. RN reports the client at his dinner last night but thus far only had a few bites of apple sauce and a sip of his ensure. Unable to assess depression and anxiety levels at this time due to decreased orientation.  Credited to [employer] [facility] on initial assessment on 1/10./2025: Anthony A Saks Jr. Is a 68 year old male patient with a medical history significant for alcohol  abuse, prior delirium  tremens (DTs), multiple pulmonary emboli, documented homelessness, and medication noncompliance is being admitted for delirium. Unfortunately, he has had several recent ER visits since late December, presenting with complaints of feeling cold and erratic behavior, but he has left against medical advice each time. Upon initial documentation, the patient was observed ambulating, but today, during evaluation, he was found in bed with bilateral soft wrist restraints. He is disoriented, alert to himself and his location, but unaware of the time or situation. Some confabulation, disturbed sensorium, restlessness, and ongoing irritability were noted. At one point, he became defensive, stating, I ain't been drinking no damn alcohol , when asked about alcohol  use. He was wearing a male PureWick device, which he continued to pick at, despite it being properly placed. Initially, the patient was highly disoriented, but after several hours, he became more verbal, oriented, and cooperative. However, he remains extremely weak, unsteady, and clearly unable to care for himself. His current presentation is consistent with delirium tremens, and he is at high risk for Wernicke's encephalopathy. Treatment will include high-dose IV thiamine .  HPI:  Anthony [name]. is a 68 y.o. male.   68 year old male with a history of alcohol  abuse, hypertension, homelessness, CVA, chronic heart failure, atrial fibrillation, history of PE in July 2024 comes in with chief complaint of homelessness.   Patient was seen in the ER multiple times yesterday.  It appears that he was sent to the [facility], likely moved to the [facility] and they had him leave because he was loud.   Patient has no complaints from his side. Upon further questioning, he said he has pain  all over.  He confirms he is homeless.  He is oriented to self, location but thinks it is 103.  He states that normally he will just sleep wherever he can find a place to rest, but  yesterday was white flag day.  He denies any substance use.   Assessment:  The patient is alert and oriented to person, place, and time, and is able to make his needs known. However, he is hard of hearing and tends to mumble when speaking, which may impact communication. The patient does not appear to be responding to internal stimuli, psychotic, or manic. He has expressed no interest in alcohol  use disorder treatment or abstinence at this time and minimizes alcohol  use. Although it is obvious patient is experiencing withdrawal and confusion, it is difficult to obtain an accurate history.   On examination, the patient is disoriented and restless, though his ability to be properly assessed for tremors is limited due to restraints. However, his symptoms are consistent with alcohol  withdrawal. Notably, his heart rate is elevated,he is anxious, irritable, agitated, confused and he was recently treated for atrial fibrillation overnight. Given the patient's current presentation, these symptoms are suggestive of alcohol  withdrawal, and continued monitoring is necessary to ensure appropriate management and prevent complications such as seizures or delirium tremens.  Past Psychiatric History:  Dx: depression, anxiety, alcohol  [use disorder] Denies h/o psych hosp Denies h/o suicide attempts  Risk to Self:  denies Risk to Others:  denies Prior Inpatient Therapy:  denies, chart review shows [facility] 07/2022 Prior Outpatient Therapy:  denies  Past Medical History:  Past Medical History:  Diagnosis Date   Actinic keratosis 11/27/2016   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) 07/18/2018   Alcohol  use disorder, severe, dependence (HCC) 09/16/2006   05/22/2015 Argumentative, belligerent and verbally abusive to staff per his note from [place]     Alcoholism (HCC)    Anxiety state 04/25/2018   Aortic atherosclerosis (HCC) 08/27/2022   Chronic low back pain 09/16/2006   Followed by NS.  Per  his chart from [place] , he fell off two storyhouse roof while cleaning his brother's gutters and sustained compression fracture of L3, L4 and L5 and fracture of right transverse process at L3 and nondisplaced fracture of left glenoid rim many years ago. 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 And 12/2017 admissions    History of pulmonary embolus (PE) 11/02/2016   Unprovoked 11/01/2016. Xarelto  from 11/01/16 to 09/08/2017.   Hydrocele    Surgically corrected   Hypertension    Hypoalbuminemia due to protein-calorie malnutrition (HCC) 07/22/2020   Lumbar compression fracture (HCC)    Multiple rib fractures 07/22/2019   Left rib details XR: left ribs demonstrate multiple remote rib   Pancytopenia (HCC)    Rhabdomyolysis 07/22/2020   Skin cancer    exciced 2017   Tinea versicolor 06/08/2013   Tobacco use disorder 07/22/2020   Tooth infection 04/25/2018   Trigger finger, acquired 11/18/2012   Ulnar tunnel syndrome of right wrist 11/08/2012    Past Surgical History:  Procedure Laterality Date   BIOPSY  05/21/2022   Procedure: BIOPSY;  Surgeon: Charlanne Groom, MD;  Location: [facility] ENDOSCOPY;  Service: Gastroenterology;;   CATARACT EXTRACTION     ESOPHAGOGASTRODUODENOSCOPY (EGD) WITH PROPOFOL  N/A 05/21/2022   Procedure: ESOPHAGOGASTRODUODENOSCOPY (EGD) WITH PROPOFOL ;  Surgeon: Charlanne Groom, MD;  Location: [facility] ENDOSCOPY;  Service: Gastroenterology;  Laterality: N/A;   FOREIGN BODY REMOVAL N/A 05/21/2022   Procedure: FOREIGN BODY REMOVAL;  Surgeon: Charlanne Groom, MD;  Location: [facility] ENDOSCOPY;  Service:  Gastroenterology;  Laterality: N/A;   HIATAL HERNIA REPAIR  2016   HYDROCELE EXCISION / REPAIR  2010   SKIN CANCER EXCISION Left    Excised 2017   Family History:  Family History  Problem Relation Age of Onset   Heart attack Father        died at 38 years   Dementia Father    Stroke Father    Cancer Sister    Colon cancer Neg Hx    Colon polyps Neg Hx    Esophageal cancer Neg Hx    Stomach cancer Neg Hx    Rectal cancer Neg Hx    Family Psychiatric  History: does not report  Social History:  Social History   Substance and Sexual Activity  Alcohol  Use Yes   Alcohol /week: 43.0 standard drinks of alcohol    Types: 42 Cans of beer, 1 Standard drinks or equivalent per week     Social History   Substance and Sexual Activity  Drug Use Yes   Types: Marijuana    Social History   Socioeconomic History   Marital status: Single    Spouse name: Not on file   Number of children: Not on file   Years of education: Not on file   Highest education level: Not on file  Occupational History   Occupation: unemployed  Tobacco Use   Smoking status: Never   Smokeless tobacco: Current    Types: Snuff   Tobacco comments:    Pt states do snuff when stressed had 1 can last 3 month//PL  Vaping Use   Vaping status: Never Used  Substance and Sexual Activity   Alcohol  use: Yes    Alcohol /week: 43.0 standard drinks of alcohol     Types: 42 Cans of beer, 1 Standard drinks or equivalent per week   Drug use: Yes    Types: Marijuana   Sexual activity: Yes    Birth control/protection: None  Other Topics Concern   Not on file  Social History Narrative   Lives with a friend. Has children but hasn't met them in a while. He doesn't know where they live.    Social Drivers of Corporate Investment Banker Strain: Not on file  Food Insecurity: Patient Unable To Answer (07/28/2023)   Hunger Vital Sign    Worried About Running Out of Food in the Last Year: Patient unable to answer    Ran Out of  Food in the Last Year: Patient unable to answer  Recent Concern: Food Insecurity - Food Insecurity Present (07/08/2023)   Hunger Vital Sign    Worried About Running Out of Food in the Last Year: Often true    Ran Out of Food in the Last Year: Often true  Transportation Needs: Patient Unable To Answer (07/28/2023)   PRAPARE - Transportation    Lack of Transportation (Medical): Patient unable to answer    Lack of Transportation (Non-Medical): Patient unable to answer  Recent Concern: Transportation Needs - Unmet Transportation Needs (07/08/2023)   PRAPARE - Administrator, Civil Service (Medical): Yes  Lack of Transportation (Non-Medical): Yes  Physical Activity: Not on file  Stress: Not on file  Social Connections: Patient Unable To Answer (07/28/2023)   Social Connection and Isolation Panel [NHANES]    Frequency of Communication with Friends and Family: Patient unable to answer    Frequency of Social Gatherings with Friends and Family: Patient unable to answer    Attends Religious Services: Patient unable to answer    Active Member of Clubs or Organizations: Patient unable to answer    Attends Banker Meetings: Patient unable to answer    Marital Status: Patient unable to answer   Additional Social History:    Allergies:   Allergies  Allergen Reactions   Other Itching and Other (See Comments)    Seasonal allergies- Itchy eyes, runny nose, congestion   Poison Ivy Extract Rash    Labs:  Results for orders placed or performed during the hospital encounter of 07/28/23 (from the past 48 hours)  Heparin  level (unfractionated)     Status: None   Collection Time: 08/02/23  4:17 AM  Result Value Ref Range   Heparin  Unfractionated 0.62 0.30 - 0.70 IU/mL    Comment: (NOTE) The clinical reportable range upper limit is being lowered to >1.10 to align with the FDA approved guidance for the current laboratory assay.  If heparin  results are below expected values,  and patient dosage has  been confirmed, suggest follow up testing of antithrombin III levels. Performed at [facility], [address], [place], [place] [address]   APTT     Status: Abnormal   Collection Time: 08/02/23  4:17 AM  Result Value Ref Range   aPTT 85 (H) 24 - 36 seconds    Comment:        IF BASELINE aPTT IS ELEVATED, SUGGEST PATIENT RISK ASSESSMENT BE USED TO DETERMINE APPROPRIATE ANTICOAGULANT THERAPY. Performed at [facility], [address] [place], [place] [address]   Comprehensive metabolic panel     Status: Abnormal   Collection Time: 08/02/23  4:17 AM  Result Value Ref Range   Sodium 135 135 - 145 mmol/L   Potassium 3.7 3.5 - 5.1 mmol/L   Chloride 111 98 - 111 mmol/L   CO2 18 (L) 22 - 32 mmol/L   Glucose, Bld 96 70 - 99 mg/dL    Comment: Glucose reference range applies only to samples taken after fasting for at least 8 hours.   BUN 11 8 - 23 mg/dL   Creatinine, Ser 9.63 (L) 0.61 - 1.24 mg/dL   Calcium  8.9 8.9 - 10.3 mg/dL   Total Protein 6.6 6.5 - 8.1 g/dL   Albumin  2.8 (L) 3.5 - 5.0 g/dL   AST 21 15 - 41 U/L   ALT 12 0 - 44 U/L   Alkaline Phosphatase 80 38 - 126 U/L   Total Bilirubin 1.1 0.0 - 1.2 mg/dL   GFR, Estimated >39 >39 mL/min    Comment: (NOTE) Calculated using the CKD-EPI Creatinine Equation (2021)    Anion gap 6 5 - 15    Comment: Performed at [facility], [address] [place], [place] [address]  Magnesium      Status: None   Collection Time: 08/02/23  4:17 AM  Result Value Ref Range   Magnesium  1.7 1.7 - 2.4 mg/dL    Comment: Performed at [facility], [address] [place], [place] [address]  Phosphorus     Status: None   Collection Time:  08/02/23  4:17 AM  Result Value Ref Range   Phosphorus 2.7 2.5 - 4.6 mg/dL    Comment: Performed at [facility], [address], [place], [place] [address]  CBC with Differential/Platelet      Status: Abnormal   Collection Time: 08/02/23  4:17 AM  Result Value Ref Range   WBC 5.9 4.0 - 10.5 K/uL   RBC 2.93 (L) 4.22 - 5.81 MIL/uL   Hemoglobin 9.5 (L) 13.0 - 17.0 g/dL   HCT 70.8 (L) 60.9 - 47.9 %   MCV 99.3 80.0 - 100.0 fL   MCH 32.4 26.0 - 34.0 pg   MCHC 32.6 30.0 - 36.0 g/dL   RDW 82.5 (H) 88.4 - 84.4 %   Platelets 213 150 - 400 K/uL   nRBC 0.0 0.0 - 0.2 %   Neutrophils Relative % 63 %   Neutro Abs 3.8 1.7 - 7.7 K/uL   Lymphocytes Relative 19 %   Lymphs Abs 1.1 0.7 - 4.0 K/uL   Monocytes Relative 13 %   Monocytes Absolute 0.8 0.1 - 1.0 K/uL   Eosinophils Relative 3 %   Eosinophils Absolute 0.2 0.0 - 0.5 K/uL   Basophils Relative 1 %   Basophils Absolute 0.0 0.0 - 0.1 K/uL   Immature Granulocytes 1 %   Abs Immature Granulocytes 0.03 0.00 - 0.07 K/uL    Comment: Performed at [facility], [address], [place], [place] [address]  Vitamin B12     Status: None   Collection Time: 08/02/23  4:17 AM  Result Value Ref Range   Vitamin B-12 473 180 - 914 pg/mL    Comment: (NOTE) This assay is not validated for testing neonatal or myeloproliferative syndrome specimens for Vitamin B12 levels. Performed at [facility], [address], [place], [place] [address]   Folate     Status: None   Collection Time: 08/02/23  4:17 AM  Result Value Ref Range   Folate 15.0 >5.9 ng/mL    Comment: Performed at [facility], [address] [place], [place] [address]  Iron  and TIBC     Status: Abnormal   Collection Time: 08/02/23  4:17 AM  Result Value Ref Range   Iron  17 (L) 45 - 182 ug/dL   TIBC 664 749 - 549 ug/dL   Saturation Ratios 5 (L) 17.9 - 39.5 %   UIBC 318 ug/dL    Comment: Performed at [facility], [address], [place], [place] [address]  Ferritin     Status: None   Collection Time: 08/02/23  4:17 AM  Result Value Ref Range   Ferritin 128 24 - 336 ng/mL    Comment: Performed at [facility], [address], [place], [place] [address]  Reticulocytes     Status: Abnormal   Collection Time: 08/02/23  4:17 AM  Result Value Ref Range   Retic Ct Pct 1.3 0.4 - 3.1 %   RBC. 2.94 (L) 4.22 - 5.81 MIL/uL   Retic Count, Absolute 37.0 19.0 - 186.0 K/uL   Immature Retic Fract 13.7 2.3 - 15.9 %    Comment: Performed at [facility], [address] [place], [place] [address]  CBC with Differential/Platelet     Status: Abnormal   Collection Time: 08/03/23  4:12 AM  Result Value Ref Range   WBC 4.0 4.0 - 10.5 K/uL   RBC 3.23 (L) 4.22 - 5.81 MIL/uL   Hemoglobin  10.4 (L) 13.0 - 17.0 g/dL   HCT 66.7 (L) 60.9 - 47.9 %   MCV 102.8 (H) 80.0 - 100.0 fL   MCH 32.2 26.0 - 34.0 pg   MCHC 31.3 30.0 - 36.0 g/dL   RDW 82.7 (H) 88.4 - 84.4 %   Platelets 243 150 - 400 K/uL   nRBC 0.0 0.0 - 0.2 %   Neutrophils Relative % 53 %   Neutro Abs 2.2 1.7 - 7.7 K/uL   Lymphocytes Relative 29 %   Lymphs Abs 1.1 0.7 - 4.0 K/uL   Monocytes Relative 12 %   Monocytes Absolute 0.5 0.1 - 1.0 K/uL   Eosinophils Relative 4 %   Eosinophils Absolute 0.2 0.0 - 0.5 K/uL   Basophils Relative 1 %   Basophils Absolute 0.1 0.0 - 0.1 K/uL   Immature Granulocytes 1 %   Abs Immature Granulocytes 0.02 0.00 - 0.07 K/uL    Comment: Performed at [facility], [address] [place], [place] [address]  Comprehensive metabolic panel     Status: Abnormal   Collection Time: 08/03/23  4:12 AM  Result Value Ref Range   Sodium 135 135 - 145 mmol/L   Potassium 4.0 3.5 - 5.1 mmol/L   Chloride 110 98 - 111 mmol/L   CO2 18 (L) 22 - 32 mmol/L   Glucose, Bld 101 (H) 70 - 99 mg/dL    Comment: Glucose reference range applies only to samples taken after fasting for at least 8 hours.   BUN 10 8 - 23 mg/dL   Creatinine, Ser 9.41 (L) 0.61 - 1.24 mg/dL   Calcium  8.8 (L) 8.9 - 10.3 mg/dL   Total Protein 6.5 6.5 - 8.1 g/dL   Albumin  2.7 (L) 3.5 - 5.0 g/dL   AST 24 15 - 41 U/L    ALT 13 0 - 44 U/L   Alkaline Phosphatase 90 38 - 126 U/L   Total Bilirubin 0.9 0.0 - 1.2 mg/dL   GFR, Estimated >39 >39 mL/min    Comment: (NOTE) Calculated using the CKD-EPI Creatinine Equation (2021)    Anion gap 7 5 - 15    Comment: Performed at [facility], [address] [place], [place] [address]  Magnesium      Status: Abnormal   Collection Time: 08/03/23  4:12 AM  Result Value Ref Range   Magnesium  1.5 (L) 1.7 - 2.4 mg/dL    Comment: Performed at [facility], [address] [place], [place] [address]  Phosphorus     Status: None   Collection Time: 08/03/23  4:12 AM  Result Value Ref Range   Phosphorus 3.2 2.5 - 4.6 mg/dL    Comment: Performed at [facility], [address], [place], [place] [address]    Current Facility-Administered Medications  Medication Dose Route Frequency Provider Last Rate Last Admin   acetaminophen  (TYLENOL ) tablet 650 mg  650 mg Oral Q6H PRN Zella, Mir M, MD   650 mg at 08/01/23 2252   Or   acetaminophen  (TYLENOL ) suppository 650 mg  650 mg Rectal Q6H PRN Zella, Mir M, MD       diltiazem  (CARDIZEM ) 125 mg in dextrose  5% 125 mL (1 mg/mL) infusion  5-15 mg/hr Intravenous Continuous Andrez Chroman, NP 5 mL/hr at 08/03/23 0930 5 mg/hr at 08/03/23 0930   enoxaparin  (LOVENOX ) injection 65 mg  65 mg Subcutaneous Q12H Sheikh, Omair Latif, DO   65 mg at 08/02/23 2100   feeding supplement (  ENSURE ENLIVE / ENSURE PLUS) liquid 237 mL  237 mL Oral BID BM Zella, Mir M, MD   237 mL at 08/02/23 1433   folic acid  (FOLVITE ) tablet 1 mg  1 mg Oral Daily Sheikh, Omair Latif, DO   1 mg at 08/02/23 0930   hydrALAZINE  (APRESOLINE ) injection 10 mg  10 mg Intravenous Q4H PRN Amin, Ankit C, MD       ipratropium-albuterol  (DUONEB) 0.5-2.5 (3) MG/3ML nebulizer solution 3 mL  3 mL Nebulization Q4H PRN Amin, Ankit C, MD       LORazepam  (ATIVAN ) tablet 1 mg  1 mg Oral Daily Starkes-Perry, Takia S, FNP        magnesium  sulfate IVPB 4 g 100 mL  4 g Intravenous Once Sheikh, Omair Latif, DO 50 mL/hr at 08/03/23 0927 4 g at 08/03/23 9072   metoprolol  tartrate (LOPRESSOR ) injection 5 mg  5 mg Intravenous Q4H PRN Amin, Ankit C, MD   5 mg at 08/03/23 0730   multivitamin with minerals tablet 1 tablet  1 tablet Oral Daily Sheikh, Omair Latif, DO   1 tablet at 08/02/23 0930   OLANZapine  (ZYPREXA ) injection 5 mg  5 mg Intramuscular Q6H PRN Amin, Ankit C, MD   5 mg at 08/03/23 0414   ondansetron  (ZOFRAN ) injection 4 mg  4 mg Intravenous Q6H PRN Amin, Ankit C, MD       piperacillin -tazobactam (ZOSYN ) IVPB 3.375 g  3.375 g Intravenous Q8H Lynwood Setter, RPH 12.5 mL/hr at 08/03/23 0422 3.375 g at 08/03/23 0422   potassium chloride  SA (KLOR-CON  M) CR tablet 40 mEq  40 mEq Oral Once Sheikh, Omair Latif, DO       thiamine  (VITAMIN B1) 500 mg in sodium chloride  0.9 % 50 mL IVPB  500 mg Intravenous Q24H Wilkie Majel RAMAN, FNP   Stopped at 08/02/23 0950   [START ON 08/07/2023] thiamine  (VITAMIN B1) tablet 100 mg  100 mg Oral Daily Sheikh, Alejandro Funkley, DO       Or   [START ON 08/07/2023] thiamine  (VITAMIN B1) injection 100 mg  100 mg Intravenous Daily Sheikh, Omair Latif, DO       vancomycin  (VANCOCIN ) 750 mg in sodium chloride  0.9 % 250 mL IVPB  750 mg Intravenous Q12H Sharlot Wanda BRAVO, RPH   Stopped at 08/02/23 2209    Musculoskeletal: Strength & Muscle Tone: restless in his bed  Gait & Station: did not witness Patient leans: None  Psychiatric Specialty Exam: Physical Exam Vitals reviewed.  Constitutional:      General: He is not in acute distress.    Appearance: He is not toxic-appearing.  HENT:     Head: Normocephalic.     Nose: Nose normal.  Pulmonary:     Effort: Pulmonary effort is normal.  Musculoskeletal:     Cervical back: Normal range of motion.  Neurological:     Mental Status: He is alert.     Review of Systems  Constitutional:  Negative for chills and fever.   Psychiatric/Behavioral:  Positive for depression and substance abuse. Negative for hallucinations and suicidal ideas. The patient is nervous/anxious and has insomnia.     Blood pressure 115/77, pulse 92, temperature 97.7 F (36.5 C), temperature source Oral, resp. rate 19, height 5' 8 (1.727 m), weight 64.7 kg, SpO2 97%.Body mass index is 21.69 kg/m.  General Appearance: Disheveled  Eye Contact:  Poor  Speech:  mumbles, incoherently  Volume:  Decreased  Mood:  Unable to assess, UTA  Affect:  blunted  Thought Process:  UTA  Orientation:  UTA  Thought Content:  UTA  Suicidal Thoughts:   UTA  Homicidal Thoughts:   UTA  Memory:  UTA  Judgement:  Impaired  Insight:  UTA  Psychomotor Activity:  Restless  Concentration:  UTA  Recall:  UTA  Fund of Knowledge:  UTA  Language:  Poor  Akathisia:  Negative  Handed:  Right  AIMS (if indicated):  UTA  Assets:  resilency  ADL's:  impaired  Cognition:  Moderate io severe impairment  Sleep:  UTA     Physical Exam: Physical Exam Vitals reviewed.  Constitutional:      General: He is not in acute distress.    Appearance: He is not toxic-appearing.  HENT:     Head: Normocephalic.     Nose: Nose normal.  Pulmonary:     Effort: Pulmonary effort is normal.  Musculoskeletal:     Cervical back: Normal range of motion.  Neurological:     Mental Status: He is alert.    Review of Systems  Constitutional:  Negative for chills and fever.  Psychiatric/Behavioral:  Positive for depression and substance abuse. Negative for hallucinations and suicidal ideas. The patient is nervous/anxious and has insomnia.    Blood pressure 115/77, pulse 92, temperature 97.7 F (36.5 C), temperature source Oral, resp. rate 19, height 5' 8 (1.727 m), weight 64.7 kg, SpO2 97%. Body mass index is 21.69 kg/m.  Treatment Plan Summary: Delirium  Alcohol  use disorder, severe   Plan: -Pt does not need inpt psych hosp at this time, however this may  change. -Ciwa and ativan  detox protocol -Continue with IV thiamine  for 9 doses (adjustment mad primary team already ordered).  -medical recommendations as per primary team -pt would benefit from substance use treatment - social worker can offer available community options   Agitation: Zyprexa  5 mg IM every six hours PRN decreased to 2.5 mg due to his delirium and need for an EKG, EKG ordered  Disposition: No evidence of imminent risk to self or others at present.   Patient does not meet criteria for psychiatric inpatient admission. Supportive therapy provided about ongoing stressors. Discussed crisis plan, support from social network, calling 911, coming to the Emergency Department, and calling Suicide Hotline.   Sharlot Becker, NP 08/03/2023 9:46 AM

## 2023-08-03 NOTE — Progress Notes (Signed)
 Pt restless in the bed. Asking for something to eat. Consumed 95% of dinner. No signs of aspiration noted.

## 2023-08-03 NOTE — Progress Notes (Signed)
 Patient has been restless and agitated all night. Still confused. PRN Zyprexa  given twice during shift but still remains agitated. HR has been sustaining to 110s-120s. PRN metoprolol  given. Remains afebrile. On call J. Blondie. NP notified. Will continue to monitor.

## 2023-08-04 DIAGNOSIS — R410 Disorientation, unspecified: Secondary | ICD-10-CM | POA: Diagnosis not present

## 2023-08-04 DIAGNOSIS — I4819 Other persistent atrial fibrillation: Secondary | ICD-10-CM

## 2023-08-04 DIAGNOSIS — F1014 Alcohol abuse with alcohol-induced mood disorder: Secondary | ICD-10-CM | POA: Diagnosis not present

## 2023-08-04 LAB — COMPREHENSIVE METABOLIC PANEL
ALT: 14 U/L (ref 0–44)
AST: 26 U/L (ref 15–41)
Albumin: 2.7 g/dL — ABNORMAL LOW (ref 3.5–5.0)
Alkaline Phosphatase: 91 U/L (ref 38–126)
Anion gap: 10 (ref 5–15)
BUN: 10 mg/dL (ref 8–23)
CO2: 20 mmol/L — ABNORMAL LOW (ref 22–32)
Calcium: 9 mg/dL (ref 8.9–10.3)
Chloride: 106 mmol/L (ref 98–111)
Creatinine, Ser: 0.47 mg/dL — ABNORMAL LOW (ref 0.61–1.24)
GFR, Estimated: >60 mL/min (ref >60)
Glucose, Bld: 85 mg/dL (ref 70–99)
Potassium: 3.8 mmol/L (ref 3.5–5.1)
Sodium: 136 mmol/L (ref 135–145)
Total Bilirubin: 0.7 mg/dL (ref 0.0–1.2)
Total Protein: 6.5 g/dL (ref 6.5–8.1)

## 2023-08-04 LAB — CBC WITH DIFFERENTIAL/PLATELET
Abs Immature Granulocytes: 0.01 10*3/uL (ref 0.00–0.07)
Basophils Absolute: 0.1 10*3/uL (ref 0.0–0.1)
Basophils Relative: 2 %
Eosinophils Absolute: 0.1 10*3/uL (ref 0.0–0.5)
Eosinophils Relative: 4 %
HCT: 31.3 % — ABNORMAL LOW (ref 39.0–52.0)
Hemoglobin: 9.9 g/dL — ABNORMAL LOW (ref 13.0–17.0)
Immature Granulocytes: 0 %
Lymphocytes Relative: 40 %
Lymphs Abs: 1.3 10*3/uL (ref 0.7–4.0)
MCH: 31.1 pg (ref 26.0–34.0)
MCHC: 31.6 g/dL (ref 30.0–36.0)
MCV: 98.4 fL (ref 80.0–100.0)
Monocytes Absolute: 0.5 10*3/uL (ref 0.1–1.0)
Monocytes Relative: 15 %
Neutro Abs: 1.3 10*3/uL — ABNORMAL LOW (ref 1.7–7.7)
Neutrophils Relative %: 39 %
Platelets: 240 10*3/uL (ref 150–400)
RBC: 3.18 MIL/uL — ABNORMAL LOW (ref 4.22–5.81)
RDW: 17.1 % — ABNORMAL HIGH (ref 11.5–15.5)
WBC: 3.3 10*3/uL — ABNORMAL LOW (ref 4.0–10.5)
nRBC: 0 % (ref 0.0–0.2)

## 2023-08-04 LAB — MAGNESIUM: Magnesium: 1.5 mg/dL — ABNORMAL LOW (ref 1.7–2.4)

## 2023-08-04 LAB — PHOSPHORUS: Phosphorus: 4.2 mg/dL (ref 2.5–4.6)

## 2023-08-04 MED ORDER — HYDROXYZINE HCL 25 MG PO TABS
25.0000 mg | ORAL_TABLET | Freq: Three times a day (TID) | ORAL | Status: DC | PRN
  Administered 2023-08-04 – 2023-08-26 (×26): 25 mg via ORAL
  Filled 2023-08-04 (×27): qty 1

## 2023-08-04 MED ORDER — METOPROLOL SUCCINATE ER 50 MG PO TB24
50.0000 mg | ORAL_TABLET | Freq: Every day | ORAL | Status: DC
  Administered 2023-08-04 – 2023-08-11 (×7): 50 mg via ORAL
  Filled 2023-08-04 (×8): qty 1

## 2023-08-04 MED ORDER — CYANOCOBALAMIN 1000 MCG/ML IJ SOLN
1000.0000 ug | Freq: Once | INTRAMUSCULAR | Status: AC
  Administered 2023-08-04: 1000 ug via INTRAMUSCULAR
  Filled 2023-08-04: qty 1

## 2023-08-04 NOTE — Progress Notes (Signed)
 Pt awake, alert, and appropriate for medication administration. This RN attempted to administer medications. Patient repeatedly refused to open his mouth and became agitated. MD aware. Will attempt to administer at a later time this shift.

## 2023-08-04 NOTE — Progress Notes (Signed)
 PROGRESS NOTE  Anthony A Butterfield Jr. ZOX:[id number] DOB: 1956-03-03 DOA: 07/28/2023 PCP: Pcp, No   LOS: 6 days   Brief Narrative / Interim history: 68 year old male with alcohol  use, frequent withdrawals and delirium in the past, PAF, recent PE, not on anticoagulation medication due to homelessness and nonadherence to medical treatment, comes into the hospital from the shelter due to delirium.  Hospital course complicated by A-fib with RVR requiring a diltiazem  drip.  He was also found to have a fever of 100.6 on 1/10, but afebrile since.  Cultures negative  Subjective / 24h Interval events: Confused, tangential today, has mittens in place.  Alert to self.  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   -He is alert today, but confused, does not answer questions and overall tangential -Thiamine  levels normal, but has received empirical high-dose thiamine  -Ammonia normal, B12 on the lower end, provide B12 injection x 1 -MRI of the brain was motion degraded but no acute findings.  EEG did not show seizures or epileptiform discharges -Psychiatry consulted, appreciate input.  Active problems Recurrent pulmonary embolism-currently on Lovenox  as he is refusing oral medications intermittently.  Most recent PE was seen December 2024  Fever-unclear etiology, cultures are negative, has received 5 days of empiric antibiotics, discontinued 1/14.  He was initially on cefepime  which was thought to may have contributed to #1, has been transitioned to Zosyn .  Currently monitor off antibiotics  Essential hypertension-has been  placed on metoprolol   PAF, with RVR -continues to refuse oral metoprolol , currently on diltiazem  infusion.  Chronic systolic CHF-EF 40-45%, global hypokinesis.  Cardiology consulted, GDMT is limited by noncompliance as well as intermittent hypotension.  Medical management  Hypokalemia, hypomagnesemia-continue to monitor and replace as appropriate  Hyponatremia-resolved  Macrocytic anemia-monitor, no bleeding  Scheduled Meds:  enoxaparin  (LOVENOX ) injection  65 mg Subcutaneous Q12H   feeding supplement  237 mL Oral BID BM   folic acid   1 mg Oral Daily   metoprolol  succinate  50 mg Oral Daily   multivitamin with minerals  1 tablet Oral Daily   potassium chloride   40 mEq Oral Once   [START ON 08/07/2023] thiamine   100 mg Oral Daily   Or   [START ON 08/07/2023] thiamine   100 mg Intravenous Daily   Continuous Infusions:  diltiazem  (CARDIZEM ) infusion 7.5 mg/hr (08/04/23 0333)   thiamine  (VITAMIN B1) injection 500 mg (08/04/23 1101)   PRN Meds:.acetaminophen  **OR** acetaminophen , hydrALAZINE , ipratropium-albuterol , metoprolol  tartrate, OLANZapine , ondansetron  (ZOFRAN ) IV  Current Outpatient Medications  Medication Instructions   acetaminophen  (TYLENOL ) 500 mg, Oral, Every 8 hours PRN   apixaban  (ELIQUIS ) 5 MG TABS tablet Take 2 tablets daily (10 mg total) twice daily for 4 days then on Dec 27, reduce to 1 tablet (5 mg total) twice daily.   FeroSul 325 mg, Oral, Daily with breakfast   folic acid  (FOLVITE ) 1 mg, Oral, Daily   magnesium  oxide (MAG-OX) 400 mg, Oral, 2 times daily   metoprolol  tartrate (LOPRESSOR ) 25 mg, Oral, 2 times daily   mometasone -formoterol  (DULERA ) 200-5 MCG/ACT AERO 2 puffs, Inhalation, 2 times daily   pantoprazole  (PROTONIX ) 40 mg, Oral, Daily   rosuvastatin  (CRESTOR ) 10  mg, Oral, Daily   thiamine  (VITAMIN B1) 100 mg, Oral, Daily    Diet Orders (From admission, onward)     Start     Ordered   08/03/23 1052  Diet full liquid Room service appropriate? Yes;  Fluid consistency: Thin  Diet effective now       Question Answer Comment  Room service appropriate? Yes   Fluid consistency: Thin      08/03/23 1051            DVT prophylaxis: SCDs Start: 07/28/23 1549   Lab Results  Component Value Date   PLT 240 08/04/2023      Code Status: Full Code  Family Communication: no family at bedside   Status is: Inpatient Remains inpatient appropriate because: severity of illness  Level of care: Progressive  Consultants:  Psychiatry   Objective: Vitals:   08/03/23 1421 08/03/23 1900 08/03/23 2038 08/04/23 0500  BP: 123/84 106/70    Pulse: (!) 107     Resp: 15 (!) 21    Temp: 97.7 F (36.5 C)  98.3 F (36.8 C) 97.7 F (36.5 C)  TempSrc: Oral  Oral Axillary  SpO2:      Weight:      Height:        Intake/Output Summary (Last 24 hours) at 08/04/2023 1219 Last data filed at 08/04/2023 0803 Gross per 24 hour  Intake 491.07 ml  Output 2250 ml  Net -1758.93 ml   Wt Readings from Last 3 Encounters:  07/28/23 64.7 kg  07/12/23 67.1 kg  03/06/23 64 kg    Examination:  Constitutional: NAD Eyes: no scleral icterus ENMT: Mucous membranes are moist.  Neck: normal, supple Respiratory: clear to auscultation bilaterally, no wheezing, no crackles.  Cardiovascular: Irregular, no edema Abdomen: non distended, no tenderness. Bowel sounds positive.  Musculoskeletal: no clubbing / cyanosis.    Data Reviewed: I have independently reviewed following labs and imaging studies   CBC Recent Labs  Lab 07/31/23 0512 08/01/23 0301 08/02/23 0417 08/03/23 0412 08/04/23 0404  WBC 8.5 7.4 5.9 4.0 3.3*  HGB 11.0* 10.0* 9.5* 10.4* 9.9*  HCT 33.9* 30.2* 29.1* 33.2* 31.3*  PLT 204 211 213 243 240  MCV 100.9* 98.1 99.3 102.8* 98.4  MCH 32.7 32.5 32.4 32.2 31.1  MCHC 32.4 33.1 32.6 31.3 31.6  RDW 18.4* 17.8* 17.4* 17.2* 17.1*  LYMPHSABS  --  0.9 1.1 1.1 1.3  MONOABS  --  0.8 0.8 0.5 0.5  EOSABS  --  0.0 0.2 0.2 0.1  BASOSABS  --  0.0  0.0 0.1 0.1    Recent Labs  Lab 07/29/23 0859 07/30/23 0403 07/31/23 1114 07/31/23 1816 08/01/23 0301 08/02/23 0417 08/03/23 0412 08/04/23 0404  NA  --    < > 127*  --  129* 135 135 136  K  --    < > 3.4*  --  4.2 3.7 4.0 3.8  CL  --    < > 101  --  104 111 110 106  CO2  --    < > 18*  --  16* 18* 18* 20*  GLUCOSE  --    < > 109*  --  100* 96 101* 85  BUN  --    < > 11  --  12 11 10 10   CREATININE  --    < > 0.32*  --  0.52* 0.36* 0.58* 0.47*  CALCIUM   --    < > 8.4*  --  8.3* 8.9 8.8* 9.0  AST  --   --  25  --  26 21 24 26   ALT  --   --  13  --  12 12 13 14   ALKPHOS  --   --  80  --  79 80 90 91  BILITOT  --   --  1.3*  --  1.5* 1.1 0.9 0.7  ALBUMIN   --   --  3.1*  --  2.8* 2.8* 2.7* 2.7*  MG  --    < > 1.4*  --  2.1 1.7 1.5* 1.5*  INR  --   --   --  1.5*  --   --   --   --   TSH 2.967  --   --   --   --   --   --   --   AMMONIA 12  --   --   --   --   --   --   --    < > = values in this interval not displayed.    ------------------------------------------------------------------------------------------------------------------ No results for input(s): "CHOL", "HDL", "LDLCALC", "TRIG", "CHOLHDL", "LDLDIRECT" in the last 72 hours.  Lab Results  Component Value Date   HGBA1C 5.2 02/06/2023   ------------------------------------------------------------------------------------------------------------------ No results for input(s): "TSH", "T4TOTAL", "T3FREE", "THYROIDAB" in the last 72 hours.  Invalid input(s): "FREET3"  Cardiac Enzymes No results for input(s): "CKMB", "TROPONINI", "MYOGLOBIN" in the last 168 hours.  Invalid input(s): "CK" ------------------------------------------------------------------------------------------------------------------    Component Value Date/Time   BNP 754.3 (H) 07/12/2023 1801    CBG: No results for input(s): "GLUCAP" in the last 168 hours.  Recent Results (from the past 240 hours)  Culture, blood (Routine X 2) w Reflex to ID  Panel     Status: None (Preliminary result)   Collection Time: 07/30/23  8:07 PM   Specimen: BLOOD RIGHT ARM  Result Value Ref Range Status   Specimen Description   Final    BLOOD RIGHT ARM AEROBIC BOTTLE ONLY Performed at [facility], [address] [place], [place] [address]    Special Requests   Final    Blood Culture results may not be optimal due to an inadequate volume of blood received in culture bottles BOTTLES DRAWN AEROBIC ONLY Performed at [facility], [address], [place], [place] [address]    Culture   Final    NO GROWTH 4 DAYS Performed at [facility], [address] [place], [place] [address]    Report Status PENDING  Incomplete  Culture, blood (Routine X 2) w Reflex to ID Panel     Status: None (Preliminary result)   Collection Time: 07/30/23  8:07 PM   Specimen: BLOOD RIGHT ARM  Result Value Ref Range Status   Specimen Description   Final    BLOOD RIGHT ARM AEROBIC BOTTLE ONLY Performed at [facility], [address], [place], [place] [address]    Special Requests   Final    Blood Culture results may not be optimal due to an inadequate volume of blood received in culture bottles BOTTLES DRAWN AEROBIC ONLY Performed at [facility], [address] [place], [place] [address]    Culture   Final    NO GROWTH 4 DAYS Performed at [facility], [address] [place], [place] [address]    Report Status PENDING  Incomplete     Radiology Studies: No results found.   Kathlen Para, MD, PhD [facility]  Between 7 am - 7 pm I am available, please contact me via Amion (for emergencies) or Securechat (non urgent messages)  Between 7 pm - 7 am I am not available, please contact night coverage MD/APP via Amion

## 2023-08-04 NOTE — Consult Note (Addendum)
 Face-to-Face Psychiatry Consult   Reason for Consult:  significant recurrent agitation. Chronic. Would benefit from chronic medications Referring Physician:  Dr. Ariel Begun  Patient Identification: Anthony [name]. 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   08/04/2023 The patient's current presentation is consistent with hypoactive delirium versus metabolic encephalopathy. He displays signs of disorientation, confusion, restlessness, and fluctuating mental status, which are indicative of disturbed sensorium. However, there are ongoing concerns regarding potential underlying etiologies, such as neuropsychological conditions or a thrombotic event. While the patient is well outside the alcohol  detox window and his B1 levels are within normal limits, he continues to receive B1 transfusions as part of his care. His condition remains complex, and further evaluation is needed to rule out other neurological or systemic causes.  The patient continues to exhibit waxing and waning dysarthria and slurred speech, which complicate communication. Additionally, there appears to be some right eye muscle weakness, which may be indicative of a "lazy eye," further suggesting possible neurological involvement. Despite these challenges, he is able to move all extremities and grip hands with bilateral mittens in place, which suggests preserved motor function to some degree.  Per nursing report patient has episodes of looseness, that fluctuates with agitation and aggression at times.  We did discuss possible etiologies and contributions  to current state of delirium and confusion.  Nursing is encouraged to ambulate patient and consider nonpharmacological interventions to include awake during daytime, opening the blinds, and out of bed to chair, reducing use of restraints and soft mittens.  She reports patient continues to make multiple attempts to get out of bed and difficult to redirect.  Nursing is encouraged to document suggested findings.  08/03/2023 Upon assessment client laying in bed with feet over the rails. Remains in bilateral mitts due to pulling at lines and other equipment. Client was incoherent and mumbled words. Unable to complete comprehensive assessment due to altered mental status.  Zyprexa  reduced related to delirium and need for a new EKG, ordered.  Credited to [employer] [facility] on initial assessment on 1/10./2025: Anthony A Raven Jr. Is a 68 year old male patient with a medical history significant for alcohol  abuse, prior delirium tremens (DTs), multiple pulmonary emboli, documented homelessness, and medication noncompliance is being admitted for delirium. Unfortunately, he has had several recent ER visits since late December, presenting with complaints of feeling cold and erratic behavior, but he has left against medical advice each time. Upon initial documentation, the patient was observed ambulating, but today, during evaluation, he was found in bed with bilateral soft wrist restraints. He is disoriented, alert to himself and his location, but unaware of the time or situation. Some confabulation, disturbed sensorium, restlessness, and ongoing irritability were noted. At one point, he became defensive, stating, "I ain't been drinking no damn alcohol ," when asked about alcohol  use. He was wearing a male PureWick device, which he continued to pick at, despite it being properly placed. Initially, the patient was highly disoriented, but after several hours, he became more verbal, oriented, and cooperative. However, he  remains extremely weak, unsteady, and clearly unable to care for  himself. His current presentation is consistent with delirium tremens, and he is at high risk for Wernicke's encephalopathy. Treatment will include high-dose IV thiamine .  HPI:  Anthony [name]. is a 68 y.o. male.   68 year old male with a history of alcohol  abuse, hypertension, homelessness, CVA, chronic heart failure, atrial fibrillation, history of PE in July 2024 comes in with chief complaint of homelessness.   Patient was seen in the ER multiple times yesterday.  It appears that he was sent to the [facility], likely moved to the [facility] and they had him leave because he was loud.   Patient has no complaints from his side. Upon further questioning, he said he has pain all over.  He confirms he is homeless.  He is oriented to self, location but thinks it is 21.  He states that normally he will just sleep wherever he can find a place to rest, but yesterday was white flag day.  He denies any substance use.   Assessment:   Given these findings, the patient's fluctuating mental state, combined with signs of neurological impairment and possible confabulation, warrants ongoing monitoring and further investigation into the potential causes of his presentation. His care team should consider exploring neuropsychological conditions, metabolic imbalances, and possible thrombotic events as part of the differential diagnosis.  Another concerning symptom is the patient's degree of confabulation, including statements about inappropriate sexual relations with physicians, which may point to cognitive dysfunction or disorganized thinking. In a recent interaction, the patient also made an unusual remark, saying, "You can sit with me for a while, but she would just have to steal while you are here," which further raises concerns about his cognitive processing and coherence.  Past Psychiatric History:  Dx: "depression, anxiety, alcohol  [use  disorder]" Denies h/o psych hosp Denies h/o suicide attempts  Risk to Self:  denies Risk to Others:  denies Prior Inpatient Therapy:  denies, chart review shows [facility] 07/2022 Prior Outpatient Therapy:  denies  Past Medical History:  Past Medical History:  Diagnosis Date   Actinic keratosis 11/27/2016   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) 07/18/2018   Alcohol  use disorder, severe, dependence (HCC) 09/16/2006   05/22/2015 Argumentative, belligerent and verbally abusive to staff per his note from [place]     Alcoholism (HCC)    Anxiety state 04/25/2018   Aortic atherosclerosis (HCC) 08/27/2022   Chronic low back pain 09/16/2006   Followed by NS.  Per his chart from [place] , he fell off two storyhouse roof while cleaning his brother's gutters and sustained compression fracture of L3, L4 and L5 and fracture of right transverse process at L3 and nondisplaced fracture of left glenoid rim many years ago. 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 And 12/2017 admissions    History of pulmonary embolus (PE) 11/02/2016   Unprovoked 11/01/2016. Xarelto  from 11/01/16 to 09/08/2017.   Hydrocele    Surgically corrected   Hypertension    Hypoalbuminemia due to protein-calorie malnutrition (HCC) 07/22/2020   Lumbar compression fracture (HCC)    Multiple rib fractures 07/22/2019   Left rib details XR: left ribs demonstrate multiple  remote rib   Pancytopenia (HCC)    Rhabdomyolysis 07/22/2020   Skin cancer    exciced 2017   Tinea versicolor 06/08/2013   Tobacco use disorder 07/22/2020    Tooth infection 04/25/2018   Trigger finger, acquired 11/18/2012   Ulnar tunnel syndrome of right wrist 11/08/2012    Past Surgical History:  Procedure Laterality Date   BIOPSY  05/21/2022   Procedure: BIOPSY;  Surgeon: Lajuan Pila, MD;  Location: [facility] ENDOSCOPY;  Service: Gastroenterology;;   CATARACT EXTRACTION     ESOPHAGOGASTRODUODENOSCOPY (EGD) WITH PROPOFOL  N/A 05/21/2022   Procedure: ESOPHAGOGASTRODUODENOSCOPY (EGD) WITH PROPOFOL ;  Surgeon: Lajuan Pila, MD;  Location: [facility] ENDOSCOPY;  Service: Gastroenterology;  Laterality: N/A;   FOREIGN BODY REMOVAL N/A 05/21/2022   Procedure: FOREIGN BODY REMOVAL;  Surgeon: Lajuan Pila, MD;  Location: [facility] ENDOSCOPY;  Service: Gastroenterology;  Laterality: N/A;   HIATAL HERNIA REPAIR  2016   HYDROCELE EXCISION / REPAIR  2010   SKIN CANCER EXCISION Left    Excised 2017   Family History:  Family History  Problem Relation Age of Onset   Heart attack Father        died at 57 years   Dementia Father    Stroke Father    Cancer Sister    Colon cancer Neg Hx    Colon polyps Neg Hx    Esophageal cancer Neg Hx    Stomach cancer Neg Hx    Rectal cancer Neg Hx    Family Psychiatric  History: does not report  Social History:  Social History   Substance and Sexual Activity  Alcohol  Use Yes   Alcohol /week: 43.0 standard drinks of alcohol    Types: 42 Cans of beer, 1 Standard drinks or equivalent per week     Social History   Substance and Sexual Activity  Drug Use Yes   Types: Marijuana    Social History   Socioeconomic History   Marital status: Single    Spouse name: Not on file   Number of children: Not on file   Years of education: Not on file   Highest education level: Not on file  Occupational History   Occupation: unemployed  Tobacco Use   Smoking status: Never   Smokeless tobacco: Current    Types: Snuff   Tobacco comments:    Pt states do snuff when stressed had 1 can last 3 month//PL  Vaping Use   Vaping status:  Never Used  Substance and Sexual Activity   Alcohol  use: Yes    Alcohol /week: 43.0 standard drinks of alcohol     Types: 42 Cans of beer, 1 Standard drinks or equivalent per week   Drug use: Yes    Types: Marijuana   Sexual activity: Yes    Birth control/protection: None  Other Topics Concern   Not on file  Social History Narrative   Lives with a friend. Has children but hasn't met them in a while. He doesn't know where they live.    Social Drivers of Corporate investment banker Strain: Not on file  Food Insecurity: Patient Unable To Answer (07/28/2023)   Hunger Vital Sign    Worried About Running Out of Food in the Last Year: Patient unable to answer    Ran Out of Food in the Last Year: Patient unable to answer  Recent Concern: Food Insecurity - Food Insecurity Present (07/08/2023)   Hunger Vital Sign    Worried About Running Out of Food in the Last Year: Often true  Ran Out of Food in the Last Year: Often true  Transportation Needs: Patient Unable To Answer (07/28/2023)   PRAPARE - Transportation    Lack of Transportation (Medical): Patient unable to answer    Lack of Transportation (Non-Medical): Patient unable to answer  Recent Concern: Transportation Needs - Unmet Transportation Needs (07/08/2023)   PRAPARE - Administrator, Civil Service (Medical): Yes    Lack of Transportation (Non-Medical): Yes  Physical Activity: Not on file  Stress: Not on file  Social Connections: Patient Unable To Answer (07/28/2023)   Social Connection and Isolation Panel [NHANES]    Frequency of Communication with Friends and Family: Patient unable to answer    Frequency of Social Gatherings with Friends and Family: Patient unable to answer    Attends Religious Services: Patient unable to answer    Active Member of Clubs or Organizations: Patient unable to answer    Attends Banker Meetings: Patient unable to answer    Marital Status: Patient unable to answer   Additional  Social History:    Allergies:   Allergies  Allergen Reactions   Other Itching and Other (See Comments)    Seasonal allergies- Itchy eyes, runny nose, congestion   Poison Ivy Extract Rash    Labs:  Results for orders placed or performed during the hospital encounter of 07/28/23 (from the past 48 hours)  CBC with Differential/Platelet     Status: Abnormal   Collection Time: 08/03/23  4:12 AM  Result Value Ref Range   WBC 4.0 4.0 - 10.5 K/uL   RBC 3.23 (L) 4.22 - 5.81 MIL/uL   Hemoglobin 10.4 (L) 13.0 - 17.0 g/dL   HCT 16.1 (L) 09.6 - 04.5 %   MCV 102.8 (H) 80.0 - 100.0 fL   MCH 32.2 26.0 - 34.0 pg   MCHC 31.3 30.0 - 36.0 g/dL   RDW 40.9 (H) 81.1 - 91.4 %   Platelets 243 150 - 400 K/uL   nRBC 0.0 0.0 - 0.2 %   Neutrophils Relative % 53 %   Neutro Abs 2.2 1.7 - 7.7 K/uL   Lymphocytes Relative 29 %   Lymphs Abs 1.1 0.7 - 4.0 K/uL   Monocytes Relative 12 %   Monocytes Absolute 0.5 0.1 - 1.0 K/uL   Eosinophils Relative 4 %   Eosinophils Absolute 0.2 0.0 - 0.5 K/uL   Basophils Relative 1 %   Basophils Absolute 0.1 0.0 - 0.1 K/uL   Immature Granulocytes 1 %   Abs Immature Granulocytes 0.02 0.00 - 0.07 K/uL    Comment: Performed at [facility], [address], [place], [place] [address]  Comprehensive metabolic panel     Status: Abnormal   Collection Time: 08/03/23  4:12 AM  Result Value Ref Range   Sodium 135 135 - 145 mmol/L   Potassium 4.0 3.5 - 5.1 mmol/L   Chloride 110 98 - 111 mmol/L   CO2 18 (L) 22 - 32 mmol/L   Glucose, Bld 101 (H) 70 - 99 mg/dL    Comment: Glucose reference range applies only to samples taken after fasting for at least 8 hours.   BUN 10 8 - 23 mg/dL   Creatinine, Ser 6.21 (L) 0.61 - 1.24 mg/dL   Calcium  8.8 (L) 8.9 - 10.3 mg/dL   Total Protein 6.5 6.5 - 8.1 g/dL   Albumin  2.7 (L) 3.5 - 5.0 g/dL   AST 24 15 - 41 U/L   ALT 13 0 - 44  U/L   Alkaline Phosphatase 90 38 - 126 U/L   Total Bilirubin 0.9 0.0 - 1.2 mg/dL   GFR,  Estimated >40 >98 mL/min    Comment: (NOTE) Calculated using the CKD-EPI Creatinine Equation (2021)    Anion gap 7 5 - 15    Comment: Performed at [facility], [address], [place], [place] [address]  Magnesium      Status: Abnormal   Collection Time: 08/03/23  4:12 AM  Result Value Ref Range   Magnesium  1.5 (L) 1.7 - 2.4 mg/dL    Comment: Performed at [facility], [address] [place], [place] [address]  Phosphorus     Status: None   Collection Time: 08/03/23  4:12 AM  Result Value Ref Range   Phosphorus 3.2 2.5 - 4.6 mg/dL    Comment: Performed at [facility], [address], [place], [place] [address]  Comprehensive metabolic panel     Status: Abnormal   Collection Time: 08/04/23  4:04 AM  Result Value Ref Range   Sodium 136 135 - 145 mmol/L   Potassium 3.8 3.5 - 5.1 mmol/L   Chloride 106 98 - 111 mmol/L   CO2 20 (L) 22 - 32 mmol/L   Glucose, Bld 85 70 - 99 mg/dL    Comment: Glucose reference range applies only to samples taken after fasting for at least 8 hours.   BUN 10 8 - 23 mg/dL   Creatinine, Ser 8.65 (L) 0.61 - 1.24 mg/dL   Calcium  9.0 8.9 - 10.3 mg/dL   Total Protein 6.5 6.5 - 8.1 g/dL   Albumin  2.7 (L) 3.5 - 5.0 g/dL   AST 26 15 - 41 U/L   ALT 14 0 - 44 U/L   Alkaline Phosphatase 91 38 - 126 U/L   Total Bilirubin 0.7 0.0 - 1.2 mg/dL   GFR, Estimated >78 >46 mL/min    Comment: (NOTE) Calculated using the CKD-EPI Creatinine Equation (2021)    Anion gap 10 5 - 15    Comment: Performed at [facility], [address], [place], [place] [address]  CBC with Differential/Platelet     Status: Abnormal   Collection Time: 08/04/23  4:04 AM  Result Value Ref Range   WBC 3.3 (L) 4.0 - 10.5 K/uL   RBC 3.18 (L) 4.22 - 5.81 MIL/uL   Hemoglobin 9.9 (L) 13.0 - 17.0 g/dL   HCT 28.4 (L) 13.2 - 44.0 %   MCV 98.4 80.0 - 100.0 fL   MCH 31.1 26.0 - 34.0 pg   MCHC 31.6 30.0 - 36.0 g/dL   RDW 10.2  (H) 72.5 - 15.5 %   Platelets 240 150 - 400 K/uL   nRBC 0.0 0.0 - 0.2 %   Neutrophils Relative % 39 %   Neutro Abs 1.3 (L) 1.7 - 7.7 K/uL   Lymphocytes Relative 40 %   Lymphs Abs 1.3 0.7 - 4.0 K/uL   Monocytes Relative 15 %   Monocytes Absolute 0.5 0.1 - 1.0 K/uL   Eosinophils Relative 4 %   Eosinophils Absolute 0.1 0.0 - 0.5 K/uL   Basophils Relative 2 %   Basophils Absolute 0.1 0.0 - 0.1 K/uL   Immature Granulocytes 0 %   Abs Immature Granulocytes 0.01 0.00 - 0.07 K/uL    Comment: Performed at [facility], [address] [place], [place] [address]  Phosphorus     Status: None   Collection Time: 08/04/23  4:04 AM  Result Value Ref Range   Phosphorus 4.2 2.5 - 4.6 mg/dL    Comment: Performed at [facility], [address], [place], [place] [address]  Magnesium      Status: Abnormal   Collection Time: 08/04/23  4:04 AM  Result Value Ref Range   Magnesium  1.5 (L) 1.7 - 2.4 mg/dL    Comment: Performed at [facility], [address], [place], [place] [address]    Current Facility-Administered Medications  Medication Dose Route Frequency Provider Last Rate Last Admin   acetaminophen  (TYLENOL ) tablet 650 mg  650 mg Oral Q6H PRN Gaylin Ke, MD   650 mg at 08/01/23 2252   Or   acetaminophen  (TYLENOL ) suppository 650 mg  650 mg Rectal Q6H PRN Jannette Mend, Mir M, MD       cyanocobalamin  (VITAMIN B12) injection 1,000 mcg  1,000 mcg Intramuscular Once Gherghe, Costin M, MD       diltiazem  (CARDIZEM ) 125 mg in dextrose  5% 125 mL (1 mg/mL) infusion  5-15 mg/hr Intravenous Continuous Bailey Lesser, NP 7.5 mL/hr at 08/04/23 0333 7.5 mg/hr at 08/04/23 8469   enoxaparin  (LOVENOX ) injection 65 mg  65 mg Subcutaneous Q12H Sheikh, Omair Latif, DO   65 mg at 08/04/23 1037   feeding supplement (ENSURE ENLIVE / ENSURE PLUS) liquid 237 mL  237 mL Oral BID BM Jannette Mend, Mir M, MD   237 mL at 08/04/23 1036   folic acid  (FOLVITE ) tablet 1  mg  1 mg Oral Daily Sheikh, Omair Latif, DO   1 mg at 08/02/23 0930   hydrALAZINE  (APRESOLINE ) injection 10 mg  10 mg Intravenous Q4H PRN Amin, Ankit C, MD       ipratropium-albuterol  (DUONEB) 0.5-2.5 (3) MG/3ML nebulizer solution 3 mL  3 mL Nebulization Q4H PRN Amin, Ankit C, MD       metoprolol  succinate (TOPROL -XL) 24 hr tablet 50 mg  50 mg Oral Daily Gherghe, Costin M, MD       metoprolol  tartrate (LOPRESSOR ) injection 5 mg  5 mg Intravenous Q4H PRN Amin, Ankit C, MD   5 mg at 08/03/23 0730   multivitamin with minerals tablet 1 tablet  1 tablet Oral Daily Sheikh, Omair Latif, DO   1 tablet at 08/02/23 0930   OLANZapine  (ZYPREXA ) injection 2.5 mg  2.5 mg Intramuscular Q6H PRN Lissa Riding, NP   2.5 mg at 08/04/23 0024   ondansetron  (ZOFRAN ) injection 4 mg  4 mg Intravenous Q6H PRN Amin, Ankit C, MD       potassium chloride  SA (KLOR-CON  M) CR tablet 40 mEq  40 mEq Oral Once Sheikh, Omair Latif, DO       thiamine  (VITAMIN B1) 500 mg in sodium chloride  0.9 % 50 mL IVPB  500 mg Intravenous Q24H Rella Cardinal, FNP 110 mL/hr at 08/04/23 1101 500 mg at 08/04/23 1101   [START ON 08/07/2023] thiamine  (VITAMIN B1) tablet 100 mg  100 mg Oral Daily Sheikh, Jonel Nephew Welaka, DO       Or   [START ON 08/07/2023] thiamine  (VITAMIN B1) injection 100 mg  100 mg Intravenous Daily Eveline Hipps, DO        Musculoskeletal: Strength & Muscle Tone: restless in his bed  Gait & Station: did not witness Patient leans: None  Psychiatric Specialty Exam: Physical Exam Vitals reviewed.  Constitutional:      General: He is not in acute distress.    Appearance: He is not toxic-appearing.  HENT:  Head: Normocephalic.     Nose: Nose normal.  Eyes:     Comments: Drooping of the right eye noted.   Pulmonary:     Effort: Pulmonary effort is normal.  Musculoskeletal:     Cervical back: Normal range of motion.  Neurological:     Mental Status: He is alert.     Review of Systems  Constitutional:   Negative for chills and fever.  Psychiatric/Behavioral:  Positive for depression and substance abuse. Negative for hallucinations and suicidal ideas. The patient is nervous/anxious and has insomnia.     Blood pressure 119/74, pulse 96, temperature 98 F (36.7 C), temperature source Oral, resp. rate 17, height 5\' 8"  (1.727 m), weight 64.7 kg, SpO2 97%.Body mass index is 21.69 kg/m.  General Appearance: Disheveled  Eye Contact:  Fair  Speech:  mumbles, incoherently  Volume:  Decreased  Mood:  Unable to assess, UTA  Affect:  blunted  Thought Process:  UTA  Orientation:  UTA  Thought Content:  UTA  Suicidal Thoughts:   UTA  Homicidal Thoughts:   UTA  Memory:  UTA  Judgement:  Impaired  Insight:  UTA  Psychomotor Activity:  Restless  Concentration:  UTA  Recall:  UTA  Fund of Knowledge:  UTA  Language:  Poor  Akathisia:  Negative  Handed:  Right  AIMS (if indicated):  UTA  Assets:  resilency  ADL's:  impaired  Cognition:  Moderate io severe impairment  Sleep:  UTA     Physical Exam: Physical Exam Vitals reviewed.  Constitutional:      General: He is not in acute distress.    Appearance: He is not toxic-appearing.  HENT:     Head: Normocephalic.     Nose: Nose normal.  Eyes:     Comments: Drooping of the right eye noted.   Pulmonary:     Effort: Pulmonary effort is normal.  Musculoskeletal:     Cervical back: Normal range of motion.  Neurological:     Mental Status: He is alert.    Review of Systems  Constitutional:  Negative for chills and fever.  Psychiatric/Behavioral:  Positive for depression and substance abuse. Negative for hallucinations and suicidal ideas. The patient is nervous/anxious and has insomnia.    Blood pressure 119/74, pulse 96, temperature 98 F (36.7 C), temperature source Oral, resp. rate 17, height 5\' 8"  (1.727 m), weight 64.7 kg, SpO2 97%. Body mass index is 21.69 kg/m.  Treatment Plan Summary: Delirium  Alcohol  use disorder, severe    Plan: -Ciwa and ativan  detox protocol -Continue with IV thiamine  500mg  daily x 7 days -pt would benefit from substance use treatment - social worker can offer available community options  -Consider reaching out to neurology for additional workup.  Agitation: Zyprexa  5 mg IM every six hours PRN decreased to 2.5 mg due to his delirium and need for an EKG, EKG ordered -Will start hydroxyzine  25 mg p.o. 3 times daily as needed  Insomnia: Open blinds, encouraged patient to stay awake during the day, may use hydroxyzine  as needed for sleep and anxiety.  Per nursing hydroxyzine  has worked well in the past.   Labs reviewed to include HIV, Hepatitis, respiratory panel, RPR, TSH, CMP (hypomagnesia 1.5), and hypoalbuminemia (2.7).  MRI motion degraded, patient remains a restless difficult to obtain good imaging at this time. -Recommend new imaging if eye droop is acute.   Psychiatry consult service will continue to follow at this time, Bagent to no improvement.  Currently length of  stay 7 days Disposition: No evidence of imminent risk to self or others at present.   Patient does not meet criteria for psychiatric inpatient admission. Supportive therapy provided about ongoing stressors. Discussed crisis plan, support from social network, calling 911, coming to the Emergency Department, and calling Suicide Hotline.   Rella Cardinal, FNP 08/04/2023 3:31 PM

## 2023-08-04 NOTE — Plan of Care (Signed)
  Problem: Clinical Measurements: Goal: Diagnostic test results will improve Outcome: Progressing Goal: Respiratory complications will improve Outcome: Progressing Goal: Cardiovascular complication will be avoided Outcome: Progressing   Problem: Safety: Goal: Ability to remain free from injury will improve Outcome: Progressing

## 2023-08-04 NOTE — Progress Notes (Addendum)
 Speech Language Pathology Treatment: Dysphagia  Patient Details Name: Anthony A Riquelme Jr. MRN: [id number] DOB: 26-Apr-1956 Today's Date: 08/04/2023 Time: 1347-1400 SLP Time Calculation (min) (ACUTE ONLY): 13 min  Assessment / Plan / Recommendation Clinical Impression  Patient with much improved mentation today subsequently improved swallowing. Anthony [name] does eat out very rapid rate without full clearance, most notably some right lateral sulci pocketing. Anthony [name] can clear however with liquid wash and cues. Patient given turkey sandwich, Ensure and tea -no clinical indication of aspiration or airway compromise noted. Patient observed to put paper towel in his mouth and masticated but did expel with SLP verbal cue and offering of more sandwich as alternative.  ? Source of consumption of inedible items = ? If this is a pattern of behavior or rare occurrence.    Anthony [name] tends to take very large bites and would benefit from full supervision for airway protection given his varying mental status and known history of hiatal hernia, distal narrowing, esophageal foreign body lodging distally (quarter coin found in distal esophagus on endoscopy in 05/2022.    Recommend regular diet with full supervision given patient likely at his baseline function for swallowing ability. Swallow precautions signs posted and no SLP follow-up needed. Thanks    Cognitive linguistic evaluation ordered, however note pt is being followed by psychiatry and given pt's encephalopathy, risk for delirium, Wernicke's - defer this to them.  Pt notably talking off topic today, asking about "correct numbers" on the board and inquired about a piece of paper in his room as well as trying to eat a paper towel indicative of current cognitive deficits.     HPI HPI: Patient is a 68 year old male admitted to [facility] after being removed from homeless shelter due to bizarre behavior.  Anthony [name] has severe dementia and alcohol  use.  Chest x-ray shows ongoing low lung  volumes with feeling right lung base opacity which is probably unresolved right pleural effusion seen in December.  No other convincing acute cardiopulmonary abnormalities.  Patient has history of foreign body lodging in distal esophagus in November 2023.  Swallow evaluation was ordered.      SLP Plan  All goals met      Recommendations for follow up therapy are one component of a multi-disciplinary discharge planning process, led by the attending physician.  Recommendations may be updated based on patient status, additional functional criteria and insurance authorization.    Recommendations  Diet recommendations: Regular;Thin liquid Liquids provided via: Cup;Straw Medication Administration: Whole meds with liquid Supervision: Full supervision/cueing for compensatory strategies (When patient can self feed recommend consider allowing this) Compensations: Minimize environmental distractions;Slow rate;Small sips/bites;Other (Comment);Lingual sweep for clearance of pocketing Postural Changes and/or Swallow Maneuvers: Seated upright 90 degrees;Upright 30-60 min after meal (assure adequate oral clearance)                  Oral care BID   Other (comment) Dysphagia, unspecified (R13.10)     All goals met    Maudie Sorrow, MS [facility] SLP Acute Rehab Services Office [phone]  Chantal Comment  08/04/2023, 2:12 PM

## 2023-08-04 NOTE — Progress Notes (Signed)
 Rounding Note    Patient Name: Anthony A Cathy Jr. Date of Encounter: 08/04/2023  [facility] Cardiologist: Antoinette Batman, MD   Subjective   He is more alert today but remains very confused. Per nurse, he gets easily agitated and is currently in soft restraints. He remains in atrial fibrillation but rates well controlled in the 80s to 90s. He refused his oral Lopressor  this morning.  Inpatient Medications    Scheduled Meds:  enoxaparin  (LOVENOX ) injection  65 mg Subcutaneous Q12H   feeding supplement  237 mL Oral BID BM   folic acid   1 mg Oral Daily   metoprolol  tartrate  25 mg Oral BID   multivitamin with minerals  1 tablet Oral Daily   potassium chloride   40 mEq Oral Once   [START ON 08/07/2023] thiamine   100 mg Oral Daily   Or   [START ON 08/07/2023] thiamine   100 mg Intravenous Daily   Continuous Infusions:  diltiazem  (CARDIZEM ) infusion 7.5 mg/hr (08/04/23 0333)   thiamine  (VITAMIN B1) injection 500 mg (08/03/23 1303)   PRN Meds: acetaminophen  **OR** acetaminophen , hydrALAZINE , ipratropium-albuterol , metoprolol  tartrate, OLANZapine , ondansetron  (ZOFRAN ) IV   Vital Signs    Vitals:   08/03/23 1421 08/03/23 1900 08/03/23 2038 08/04/23 0500  BP: 123/84 106/70    Pulse: (!) 107     Resp: 15 (!) 21    Temp: 97.7 F (36.5 C)  98.3 F (36.8 C) 97.7 F (36.5 C)  TempSrc: Oral  Oral Axillary  SpO2:      Weight:      Height:        Intake/Output Summary (Last 24 hours) at 08/04/2023 1049 Last data filed at 08/04/2023 0803 Gross per 24 hour  Intake 491.07 ml  Output 2250 ml  Net -1758.93 ml      07/28/2023    9:59 PM 07/12/2023    3:44 AM 07/11/2023    5:00 AM  Last 3 Weights  Weight (lbs) 142 lb 10.2 oz 147 lb 14.9 oz 150 lb 12.7 oz  Weight (kg) 64.7 kg 67.1 kg 68.4 kg      Telemetry    Atrial fibrillation with rates in the 80s to 90s today. - Personally Reviewed  ECG    No new ECG tracing today. - Personally Reviewed  Physical Exam    GEN: Thin Caucasian male resting in no acute distressed. Very confused. Neck: No JVD. Cardiac: Irregularly irregular rhythm with normal rate. No murmurs, rubs, or gallops.  Respiratory: Clear to auscultation anteriorly. No wheezes, rhonchi, or rales appreciated. GI: Soft, non-distended, and non-tender. MS: No lower extremity edema. No deformity. Neuro:  No focal deficits. Psych: Normal affect. Responds appropriately.  Labs    High Sensitivity Troponin:   Recent Labs  Lab 07/07/23 1955 07/07/23 2300 07/30/23 2007 07/30/23 2126  TROPONINIHS 14 11 8 8      Chemistry Recent Labs  Lab 08/02/23 0417 08/03/23 0412 08/04/23 0404  NA 135 135 136  K 3.7 4.0 3.8  CL 111 110 106  CO2 18* 18* 20*  GLUCOSE 96 101* 85  BUN 11 10 10   CREATININE 0.36* 0.58* 0.47*  CALCIUM  8.9 8.8* 9.0  MG 1.7 1.5* 1.5*  PROT 6.6 6.5 6.5  ALBUMIN  2.8* 2.7* 2.7*  AST 21 24 26   ALT 12 13 14   ALKPHOS 80 90 91  BILITOT 1.1 0.9 0.7  GFRNONAA >60 >60 >60  ANIONGAP 6 7 10     Lipids No results for input(s): "CHOL", "TRIG", "HDL", "LABVLDL", "LDLCALC", "  CHOLHDL" in the last 168 hours.  Hematology Recent Labs  Lab 08/02/23 0417 08/03/23 0412 08/04/23 0404  WBC 5.9 4.0 3.3*  RBC 2.93*  2.94* 3.23* 3.18*  HGB 9.5* 10.4* 9.9*  HCT 29.1* 33.2* 31.3*  MCV 99.3 102.8* 98.4  MCH 32.4 32.2 31.1  MCHC 32.6 31.3 31.6  RDW 17.4* 17.2* 17.1*  PLT 213 243 240   Thyroid  Recent Labs  Lab 07/29/23 0859  TSH 2.967    BNPNo results for input(s): "BNP", "PROBNP" in the last 168 hours.  DDimer No results for input(s): "DDIMER" in the last 168 hours.   Radiology    No results found.  Cardiac Studies   Echocardiogram 07/08/2023: Impressions: 1. Hypokinesis of the inferior wall from the base to the mid wall. Left  ventricular ejection fraction, by estimation, is 45 to 50%. The left  ventricle has mildly decreased function. The left ventricle has no  regional wall motion abnormalities. Left   ventricular diastolic parameters are indeterminate.   2. Right ventricular systolic function is mildly reduced. The right  ventricular size is moderately enlarged. There is normal pulmonary artery  systolic pressure.   3. Left atrial size was moderately dilated.   4. Right atrial size was mild to moderately dilated.   5. The mitral valve is degenerative. Mild to moderate mitral valve  regurgitation.   6. The aortic valve was not well visualized. Aortic valve regurgitation  is not visualized.   7. The inferior vena cava is normal in size with <50% respiratory  variability, suggesting right atrial pressure of 8 mmHg.   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   Assessment & Plan    Persistent Atrial Fibrillation Patient has a long history of atrial fibrillation complicated by non-compliance and severe alcohol  abuse. He was admitted for delirium felt to be secondary to alcohol  abuse vs withdrawal. Hospitalization has been complicated by atrial fibrillation with RVR. He was started on IV Diltiazem  but has had some intermittent hypotension with this. - Rates look a Kinderman better today in the 80s to 90s. - Potassium 4.0 today.  - Magnesium  1.5 today. Being repleted by primary team. - TSH normal.  - Currently on IV Diltiazem  7.5mg /hr. BP stable so OK to continue.  - Continue Lopressor  25mg  twice daily (started on 1/14). Will placed hold parameters given soft BP. Of note, he refused to take morning dose.  - Of note, patient has previously required Midodrine  during prior admissions due to  hypotension. No need for this at this time. - Suspect severe alcohol  abuse and withdrawal is contributing to RVR. This is overall a very difficult situation given his alcohol  use disorder, non-compliance, and homelessness. Will plan for rate control.  - Currently on anticoagulation with Lovenox  injection. He has been non-compliant with oral anticoagulation in the past. He is not a good long-term anticoagulation candidate given his history of severe alcohol  abuse. However, he also was a history of recurrent PE and needs anticoagulation for this as well.   Chronic HFmrEF Echo in 06/2023 during an admission for acute PE showed LVEF of LVEF of 45-50%  with hypokinesis of the inferior wall, moderately enlarged RV with mildly reduced RV function, mild to moderate MR. EF had slightly improved from 40-45% in 01/2023.  - Euvolemic on exam.  - OK to continue IV Diltiazem  as above given EF only slightly reduced.  - GDMT limited by hypotension. Continue beta-blocker as above. - Mildly reduced EF may be secondary to acute PE. He was initially diagnosed with this in 01/2023 and then was readmitted for acute PE in 06/2023 in setting of medication non-compliance. He does have a history of non-obstructive CAD on prior coronary CTA in 09/2020. Given his history of medication non-compliance and severe alcohol  abuse, would not pursue additional ischemic evaluation at this time. Would continue to treat medically.    Non-Obstructive CAD Coronary CTA in 09/2020 showed a coronary calcium  score of 884 (91st percentile for age and sex) with moderate non-obstructive CAD. High-sensitivity troponin negative x2. - No aspirin  given need for anticoagulation. - Ideally would be on a statin but think it is unlikely that is will take this on discharge.   Otherwise, per primary team: - Acute metabolic encephalopathy with persistent delirium - Recurrent PE - Fever (unclear etiology)  - Metabolic acidosis - Electrolyte abnormalities  -  Hypalbuminemia - Alcohol  abuse - Homelessness  For questions or updates, please contact [facility] Please consult [email or web address] for contact info under        Signed, Fortunata Betty E Daryle Boyington, PA-C  08/04/2023, 10:49 AM

## 2023-08-05 ENCOUNTER — Inpatient Hospital Stay: Payer: 59 | Admitting: Physician Assistant

## 2023-08-05 DIAGNOSIS — R410 Disorientation, unspecified: Secondary | ICD-10-CM | POA: Diagnosis not present

## 2023-08-05 LAB — CBC
HCT: 32.9 % — ABNORMAL LOW (ref 39.0–52.0)
Hemoglobin: 10.7 g/dL — ABNORMAL LOW (ref 13.0–17.0)
MCH: 31.8 pg (ref 26.0–34.0)
MCHC: 32.5 g/dL (ref 30.0–36.0)
MCV: 97.9 fL (ref 80.0–100.0)
Platelets: 269 10*3/uL (ref 150–400)
RBC: 3.36 MIL/uL — ABNORMAL LOW (ref 4.22–5.81)
RDW: 17.2 % — ABNORMAL HIGH (ref 11.5–15.5)
WBC: 3.2 10*3/uL — ABNORMAL LOW (ref 4.0–10.5)
nRBC: 0 % (ref 0.0–0.2)

## 2023-08-05 LAB — CULTURE, BLOOD (ROUTINE X 2)
Culture: NO GROWTH
Culture: NO GROWTH

## 2023-08-05 LAB — COMPREHENSIVE METABOLIC PANEL
ALT: 16 U/L (ref 0–44)
AST: 26 U/L (ref 15–41)
Albumin: 2.9 g/dL — ABNORMAL LOW (ref 3.5–5.0)
Alkaline Phosphatase: 92 U/L (ref 38–126)
Anion gap: 11 (ref 5–15)
BUN: 8 mg/dL (ref 8–23)
CO2: 19 mmol/L — ABNORMAL LOW (ref 22–32)
Calcium: 8.8 mg/dL — ABNORMAL LOW (ref 8.9–10.3)
Chloride: 102 mmol/L (ref 98–111)
Creatinine, Ser: 0.5 mg/dL — ABNORMAL LOW (ref 0.61–1.24)
GFR, Estimated: >60 mL/min (ref >60)
Glucose, Bld: 76 mg/dL (ref 70–99)
Potassium: 3.6 mmol/L (ref 3.5–5.1)
Sodium: 132 mmol/L — ABNORMAL LOW (ref 135–145)
Total Bilirubin: 0.6 mg/dL (ref 0.0–1.2)
Total Protein: 6.8 g/dL (ref 6.5–8.1)

## 2023-08-05 LAB — CORTISOL: Cortisol, Plasma: 7 ug/dL

## 2023-08-05 LAB — MAGNESIUM: Magnesium: 1.4 mg/dL — ABNORMAL LOW (ref 1.7–2.4)

## 2023-08-05 LAB — PHOSPHORUS: Phosphorus: 3.8 mg/dL (ref 2.5–4.6)

## 2023-08-05 MED ORDER — MAGNESIUM SULFATE 2 GM/50ML IV SOLN
2.0000 g | Freq: Once | INTRAVENOUS | Status: AC
  Administered 2023-08-05: 2 g via INTRAVENOUS
  Filled 2023-08-05: qty 50

## 2023-08-05 NOTE — Plan of Care (Signed)
  Problem: Clinical Measurements: Goal: Diagnostic test results will improve Outcome: Progressing Goal: Respiratory complications will improve Outcome: Progressing Goal: Cardiovascular complication will be avoided Outcome: Progressing   Problem: Safety: Goal: Ability to remain free from injury will improve Outcome: Progressing   

## 2023-08-05 NOTE — Plan of Care (Signed)
  Problem: Clinical Measurements: Goal: Will remain free from infection Outcome: Progressing   Problem: Clinical Measurements: Goal: Diagnostic test results will improve Outcome: Progressing   Problem: Clinical Measurements: Goal: Respiratory complications will improve Outcome: Progressing   Problem: Clinical Measurements: Goal: Cardiovascular complication will be avoided Outcome: Progressing   

## 2023-08-05 NOTE — Progress Notes (Addendum)
Physical Therapy Treatment Patient Details Name: Anthony [name]. MRN: [id number] DOB: 06/19/1956 Today's Date: 08/05/2023   History of Present Illness Pt is a 68 y/o male who presented to the hospital on 07/28/23 after being kicked out of a shelter 2/2 bothering others there, EMS was called & pt was brought back to the ER. Pt has had multiple ER visits since the end of December c/o of being cold, erratic behavior & then leaving AMA. PMH: alcohol abuse & prior DTs, multiple PE, homelessness, anxiety, depression, chronic LBP, hiatal hernia, hydrocele    PT Comments  Pt requiring some assist due to decreased safety awareness and cognitive deficits.  Pt ambulated short distance and then returned to bed.  Pt presents as HIGH fall risk.  At this time, patient will benefit from continued inpatient follow up therapy, <3 hours/day.  However per most recent TOC note on 1/10: "Patient is not a candidate for SNF due to the lack of a discharge plan after SNF. Pt will return to the shelter after hospitalization."     If plan is discharge home, recommend the following: A Scannell help with walking and/or transfers;A Aicher help with bathing/dressing/bathroom;Assistance with cooking/housework;Assist for transportation;Help with stairs or ramp for entrance;Direct supervision/assist for medications management;Direct supervision/assist for financial management;Supervision due to cognitive status   Can travel by private vehicle        Equipment Recommendations  Rolling walker (2 wheels)    Recommendations for Other Services       Precautions / Restrictions Precautions Precautions: Fall Precaution Comments: mittens     Mobility  Bed Mobility Overal bed mobility: Needs Assistance Bed Mobility: Supine to Sit, Sit to Supine     Supine to sit: Supervision, HOB elevated, Used rails Sit to supine: Mod assist   General bed mobility comments: multimodal cues for getting to EOB, more physical assist for return  to bed as pt reluctant    Transfers Overall transfer level: Needs assistance Equipment used: Rolling walker (2 wheels) Transfers: Sit to/from Stand Sit to Stand: Min assist           General transfer comment: assist for stabilizing, multimodal cues required for technique and safety    Ambulation/Gait Ambulation/Gait assistance: Min assist Gait Distance (Feet): 16 Feet Assistive device: Rolling walker (2 wheels) Gait Pattern/deviations: Step-through pattern, Decreased stride length, Narrow base of support, Shuffle Gait velocity: decreased     General Gait Details: requiring multimodal cues for RW use and safety, pt performed his preferred distance, +2 for safety, assist for stability   Stairs             Wheelchair Mobility     Tilt Bed    Modified Rankin (Stroke Patients Only)       Balance                                            Cognition Arousal: Alert Behavior During Therapy: Restless, Impulsive Overall Cognitive Status: Impaired/Different from baseline Area of Impairment: Following commands, Safety/judgement, Problem solving                       Following Commands: Follows multi-step commands inconsistently, Follows one step commands with increased time Safety/Judgement: Decreased awareness of deficits, Decreased awareness of safety   Problem Solving: Slow processing, Difficulty sequencing, Requires verbal cues, Requires tactile cues General Comments: pt required  constant redirection, gentle coaxing required for pt to return to bed, maintained mittens        Exercises      General Comments        Pertinent Vitals/Pain Pain Assessment Pain Assessment: No/denies pain Pain Intervention(s): Monitored during session, Repositioned    Home Living                          Prior Function            PT Goals (current goals can now be found in the care plan section) Acute Rehab PT Goals PT Goal  Formulation: Patient unable to participate in goal setting Time For Goal Achievement: 08/19/23 Potential to Achieve Goals: Fair Progress towards PT goals: Progressing toward goals    Frequency    Min 1X/week      PT Plan      Co-evaluation              AM-PAC PT "6 Clicks" Mobility   Outcome Measure  Help needed turning from your back to your side while in a flat bed without using bedrails?: None Help needed moving from lying on your back to sitting on the side of a flat bed without using bedrails?: A Tuohy Help needed moving to and from a bed to a chair (including a wheelchair)?: A Kreher Help needed standing up from a chair using your arms (e.g., wheelchair or bedside chair)?: A Rakes Help needed to walk in hospital room?: A Lot Help needed climbing 3-5 steps with a railing? : A Lot 6 Click Score: 17    End of Session Equipment Utilized During Treatment: Gait belt Activity Tolerance: Patient tolerated treatment well Patient left: in bed;with call bell/phone within reach;with bed alarm set;with nursing/sitter in room (maintained mittens for session, applied floor mat, requested RN obtain another (per staff pt has been attempting to exit bed all day)) Nurse Communication: Mobility status PT Visit Diagnosis: Other abnormalities of gait and mobility (R26.89);Unsteadiness on feet (R26.81)     Time: 1610-9604 PT Time Calculation (min) (ACUTE ONLY): 18 min  Charges:    $Gait Training: 8-22 mins PT General Charges $$ ACUTE PT VISIT: 1 Visit                     Thomasene Mohair PT, DPT Physical Therapist Acute Rehabilitation Services Office: [phone]   Kati L Payson 08/05/2023, 5:00 PM

## 2023-08-05 NOTE — Progress Notes (Signed)
PROGRESS NOTE  Anthony A Delpino Jr. UJW:[id number] DOB: 1956-01-01 DOA: 07/28/2023 PCP: Pcp, No   LOS: 7 days   Brief Narrative / Interim history: 68 year old male with alcohol use, frequent withdrawals and delirium in the past, PAF, recent PE, not on anticoagulation medication due to homelessness and nonadherence to medical treatment, comes into the hospital from the shelter due to delirium.  Hospital course complicated by A-fib with RVR requiring a diltiazem drip.  He was also found to have a fever of 100.6 on 1/10, but afebrile since.  Cultures negative  Subjective / 24h Interval events: Remains confused today.  Slow to respond at times  Assesement and Plan: Principal Problem:   Delirium Active Problems:   Alcohol abuse with alcohol-induced mood disorder (HCC)   Alcohol withdrawal delirium (HCC)   Agitation  Principal problem Acute metabolic encephalopathy, delirium, underlying EtOH use -patient has having episodes like this in the past, noted several times during prior hospitalizations and thought to be in the setting of alcohol withdrawal/DTs.   -He is alert today, but confused, does not answer questions and overall tangential -Thiamine levels normal, but has received empirical high-dose thiamine -Ammonia normal, B12 on the lower end, provide B12 injection x 1 -MRI of the brain was motion degraded but no acute findings.  EEG did not show seizures or epileptiform discharges -RPR negative -Psychiatry consulted, appreciate input. -Discussed with the [facility] with neurology today, case reviewed and images reviewed.  Given that he has been having intermittent confusions with prior hospitalizations, this may suggest an underlying neurodegenerative disorder possibly in the setting of longstanding EtOH use.  This is likely to have progressed.  Does not recall our additional testing or interventions that could be done at this point but weight and give him time  Active problems Recurrent  pulmonary embolism-currently on Lovenox as he is refusing oral medications intermittently.  Most recent PE was seen December 2024.  On room air, no chest pain  Fever-unclear etiology, cultures are negative, has received 5 days of empiric antibiotics, discontinued 1/14.  He was initially on cefepime which was thought to may have contributed to #1, has been transitioned to Zosyn.  Currently monitor off antibiotics, remains afebrile  Essential hypertension-has been placed on metoprolol.  Blood pressure is stable  PAF, with RVR -continues to refuse oral metoprolol, currently on diltiazem infusion.  Chronic systolic CHF-EF 40-45%, global hypokinesis.  Cardiology consulted, GDMT is limited by noncompliance as well as intermittent hypotension.  Medical management  Hypokalemia, hypomagnesemia-continue to monitor and replace as appropriate  Hyponatremia-resolved  Macrocytic anemia-monitor, no bleeding  Scheduled Meds:  enoxaparin (LOVENOX) injection  65 mg Subcutaneous Q12H   feeding supplement  237 mL Oral BID BM   folic acid  1 mg Oral Daily   metoprolol succinate  50 mg Oral Daily   multivitamin with minerals  1 tablet Oral Daily   potassium chloride  40 mEq Oral Once   [START ON 08/07/2023] thiamine  100 mg Oral Daily   Or   [START ON 08/07/2023] thiamine  100 mg Intravenous Daily   Continuous Infusions:  diltiazem (CARDIZEM) infusion Stopped (08/04/23 1714)   magnesium sulfate bolus IVPB     thiamine (VITAMIN B1) injection Stopped (08/04/23 1131)   PRN Meds:.acetaminophen **OR** acetaminophen, hydrALAZINE, hydrOXYzine, ipratropium-albuterol, metoprolol tartrate, OLANZapine, ondansetron (ZOFRAN) IV  Current Outpatient Medications  Medication Instructions   acetaminophen (TYLENOL) 500 mg, Oral, Every 8 hours PRN   apixaban (ELIQUIS) 5 MG TABS tablet Take 2 tablets daily (10 mg  total) twice daily for 4 days then on Dec 27, reduce to 1 tablet (5 mg total) twice daily.   FeroSul 325 mg,  Oral, Daily with breakfast   folic acid (FOLVITE) 1 mg, Oral, Daily   magnesium oxide (MAG-OX) 400 mg, Oral, 2 times daily   metoprolol tartrate (LOPRESSOR) 25 mg, Oral, 2 times daily   mometasone-formoterol (DULERA) 200-5 MCG/ACT AERO 2 puffs, Inhalation, 2 times daily   pantoprazole (PROTONIX) 40 mg, Oral, Daily   rosuvastatin (CRESTOR) 10 mg, Oral, Daily   thiamine (VITAMIN B1) 100 mg, Oral, Daily    Diet Orders (From admission, onward)     Start     Ordered   08/04/23 1400  Diet regular Fluid consistency: Thin  Diet effective now       Question:  Fluid consistency:  Answer:  Thin   08/04/23 1359            DVT prophylaxis: SCDs Start: 07/28/23 1549   Lab Results  Component Value Date   PLT 269 08/05/2023      Code Status: Full Code  Family Communication: no family at bedside   Status is: Inpatient Remains inpatient appropriate because: severity of illness  Level of care: Progressive  Consultants:  Psychiatry   Objective: Vitals:   08/04/23 1200 08/04/23 1311 08/04/23 1400 08/05/23 0344  BP: 119/72 (!) 128/108 119/74 134/82  Pulse:  96    Resp: 15  17   Temp:  98 F (36.7 C)  97.8 F (36.6 C)  TempSrc:  Oral  Oral  SpO2:  97%    Weight:      Height:        Intake/Output Summary (Last 24 hours) at 08/05/2023 1024 Last data filed at 08/05/2023 0900 Gross per 24 hour  Intake 579.25 ml  Output 1450 ml  Net -870.75 ml   Wt Readings from Last 3 Encounters:  07/28/23 64.7 kg  07/12/23 67.1 kg  03/06/23 64 kg    Examination:  Constitutional: NAD Eyes: lids and conjunctivae normal, no scleral icterus ENMT: mmm Neck: normal, supple Respiratory: clear to auscultation bilaterally, no wheezing, no crackles. Normal respiratory effort.  Cardiovascular: Regular rate and rhythm, no murmurs / rubs / gallops. No LE edema. Abdomen: soft, no distention, no tenderness. Bowel sounds positive.  Skin: no rashes Neurologic: no focal deficits, equal  strength   Data Reviewed: I have independently reviewed following labs and imaging studies   CBC Recent Labs  Lab 08/01/23 0301 08/02/23 0417 08/03/23 0412 08/04/23 0404 08/05/23 0358  WBC 7.4 5.9 4.0 3.3* 3.2*  HGB 10.0* 9.5* 10.4* 9.9* 10.7*  HCT 30.2* 29.1* 33.2* 31.3* 32.9*  PLT 211 213 243 240 269  MCV 98.1 99.3 102.8* 98.4 97.9  MCH 32.5 32.4 32.2 31.1 31.8  MCHC 33.1 32.6 31.3 31.6 32.5  RDW 17.8* 17.4* 17.2* 17.1* 17.2*  LYMPHSABS 0.9 1.1 1.1 1.3  --   MONOABS 0.8 0.8 0.5 0.5  --   EOSABS 0.0 0.2 0.2 0.1  --   BASOSABS 0.0 0.0 0.1 0.1  --     Recent Labs  Lab 07/31/23 1816 08/01/23 0301 08/02/23 0417 08/03/23 0412 08/04/23 0404 08/05/23 0358  NA  --  129* 135 135 136 132*  K  --  4.2 3.7 4.0 3.8 3.6  CL  --  104 111 110 106 102  CO2  --  16* 18* 18* 20* 19*  GLUCOSE  --  100* 96 101* 85 76  BUN  --  12 11 10 10 8   CREATININE  --  0.52* 0.36* 0.58* 0.47* 0.50*  CALCIUM  --  8.3* 8.9 8.8* 9.0 8.8*  AST  --  26 21 24 26 26   ALT  --  12 12 13 14 16   ALKPHOS  --  79 80 90 91 92  BILITOT  --  1.5* 1.1 0.9 0.7 0.6  ALBUMIN  --  2.8* 2.8* 2.7* 2.7* 2.9*  MG  --  2.1 1.7 1.5* 1.5* 1.4*  INR 1.5*  --   --   --   --   --     ------------------------------------------------------------------------------------------------------------------ No results for input(s): "CHOL", "HDL", "LDLCALC", "TRIG", "CHOLHDL", "LDLDIRECT" in the last 72 hours.  Lab Results  Component Value Date   HGBA1C 5.2 02/06/2023   ------------------------------------------------------------------------------------------------------------------ No results for input(s): "TSH", "T4TOTAL", "T3FREE", "THYROIDAB" in the last 72 hours.  Invalid input(s): "FREET3"  Cardiac Enzymes No results for input(s): "CKMB", "TROPONINI", "MYOGLOBIN" in the last 168 hours.  Invalid input(s):  "CK" ------------------------------------------------------------------------------------------------------------------    Component Value Date/Time   BNP 754.3 (H) 07/12/2023 1801    CBG: No results for input(s): "GLUCAP" in the last 168 hours.  Recent Results (from the past 240 hours)  Culture, blood (Routine X 2) w Reflex to ID Panel     Status: None   Collection Time: 07/30/23  8:07 PM   Specimen: BLOOD RIGHT ARM  Result Value Ref Range Status   Specimen Description   Final    BLOOD RIGHT ARM AEROBIC BOTTLE ONLY Performed at [facility], [address], [place], [place] [address]    Special Requests   Final    Blood Culture results may not be optimal due to an inadequate volume of blood received in culture bottles BOTTLES DRAWN AEROBIC ONLY Performed at [facility], [address], [place], [place] [address]    Culture   Final    NO GROWTH 5 DAYS Performed at [facility], [address] [place], [place] [address]    Report Status 08/05/2023 FINAL  Final  Culture, blood (Routine X 2) w Reflex to ID Panel     Status: None   Collection Time: 07/30/23  8:07 PM   Specimen: BLOOD RIGHT ARM  Result Value Ref Range Status   Specimen Description   Final    BLOOD RIGHT ARM AEROBIC BOTTLE ONLY Performed at [facility], [address], [place], [place] [address]    Special Requests   Final    Blood Culture results may not be optimal due to an inadequate volume of blood received in culture bottles BOTTLES DRAWN AEROBIC ONLY Performed at [facility], [address] [place], [place] [address]    Culture   Final    NO GROWTH 5 DAYS Performed at [facility], [address], [place], [place] [address]    Report Status 08/05/2023 FINAL  Final     Radiology Studies: No results found.   Pamella Pert, MD, PhD [facility]  Between 7 am - 7 pm I am available, please contact me  via Amion (for emergencies) or Securechat (non urgent messages)  Between 7 pm - 7 am I am not available, please contact night coverage MD/APP via Amion

## 2023-08-05 NOTE — Consult Note (Signed)
Face-to-Face Psychiatry Consult   Reason for Consult:  significant recurrent agitation. Chronic. Would benefit from chronic medications Referring Physician:  Dr. Nelson Chimes  Patient Identification: Anthony [name]. MRN:  [id number] Principal Diagnosis: Delirium Diagnosis:  Principal Problem:   Delirium Active Problems:   Alcohol abuse with alcohol-induced mood disorder (HCC)   Alcohol withdrawal delirium (HCC)   Agitation   Total Time spent with patient: 35 minutes  Subjective:   Anthony [name] is a 67y/o male with a psychiatric history significant for alcohol use disorder, depression and anxiety who presents to the ED with complaint of AMS.   The patient's current presentation is consistent with hypoactive delirium versus metabolic encephalopathy. He displays signs of disorientation, confusion, restlessness, and fluctuating mental status, which are indicative of disturbed sensorium. However, there are ongoing concerns regarding potential underlying etiologies, such as neuropsychological conditions or a thrombotic event. While the patient is well outside the alcohol detox window and his B1 levels are within normal limits, he continues to receive B1 transfusions as part of his care. His condition remains complex, and further evaluation is needed to rule out other neurological or systemic causes. The patient continues to exhibit waxing and waning dysarthria and slurred speech, which complicate communication. Additionally, there appears to be some right eye muscle weakness, which may be indicative of a "lazy eye," further suggesting possible neurological involvement. Despite these challenges, he is able to move all extremities and grip hands with bilateral mittens in place, which suggests preserved motor function to some degree.  08/05/2023: Patient sedated and minimally arousable to voice, touch and sternal tap. MAR documents that he has received Zyprexa IM the past 2 evenings. Nurses report that he has  been more agitated and requires soft mitts and a sitter at his bedside.  No concerns expressed from sitter. Reports that he was awake early this morning. Drank 240 ml and ate 50% of his breakfast.   Nurses said that he has been wide awake all day yesterday, and did not sleep well. Oncoming day nurse was told "If he sleeps, he will sleep all day".  Last EKG 07/31/2023 A-fib with Premature ventricular or aberrantly conducted complexes, nonspecific ST and T wave abnormalities, QTc 494. Ordered a new EKG to be completed today. Delirium precautions reviewed.  I have encouraged sitter to keep the patient awake and attempt to get him into a chair.  No known family members have been contacted..  Nurses state that no one has been to visit.  08/04/2023 Per nursing report patient has episodes of looseness, that fluctuates with agitation and aggression at times.  We did discuss possible etiologies and contributions to current state of delirium and confusion.  Nursing is encouraged to ambulate patient and consider nonpharmacological interventions to include awake during daytime, opening the blinds, and out of bed to chair, reducing use of restraints and soft mittens.  She reports patient continues to make multiple attempts to get out of bed and difficult to redirect.  Nursing is encouraged to document suggested findings.  08/03/2023 Upon assessment client laying in bed with feet over the rails. Remains in bilateral mitts due to pulling at lines and other equipment. Client was incoherent and mumbled words. Unable to complete comprehensive assessment due to altered mental status.  Zyprexa reduced related to delirium and need for a new EKG, ordered.  Credited to [employer] [facility] on initial assessment on 1/10./2025: Anthony A Morgan Jr. Is a 68 year old male patient with a medical history significant for  alcohol abuse, prior delirium tremens (DTs), multiple pulmonary emboli, documented homelessness, and  medication noncompliance is being admitted for delirium. Unfortunately, he has had several recent ER visits since late December, presenting with complaints of feeling cold and erratic behavior, but he has left against medical advice each time. Upon initial documentation, the patient was observed ambulating, but today, during evaluation, he was found in bed with bilateral soft wrist restraints. He is disoriented, alert to himself and his location, but unaware of the time or situation. Some confabulation, disturbed sensorium, restlessness, and ongoing irritability were noted. At one point, he became defensive, stating, "I ain't been drinking no damn alcohol," when asked about alcohol use. He was wearing a male PureWick device, which he continued to pick at, despite it being properly placed. Initially, the patient was highly disoriented, but after several hours, he became more verbal, oriented, and cooperative. However, he remains extremely weak, unsteady, and clearly unable to care for himself. His current presentation is consistent with delirium tremens, and he is at high risk for Wernicke's encephalopathy. Treatment will include high-dose IV thiamine.  HPI:  Anthony [name]. is a 68 y.o. male. Admitted on 07/28/2023: 68 year old male with a history of alcohol abuse, hypertension, homelessness, CVA, chronic heart failure, atrial fibrillation, history of PE in July 2024 comes in with chief complaint of homelessness.   Patient was seen in the ER multiple times between 1/6-1/8.  It appears that he was sent to the [facility], likely moved to the [facility] and they had him leave because he was loud. Patient has no complaints from his side. Upon further questioning, he said he has pain all over.  He confirms he is homeless.  He is oriented to self, location but thinks it is 69.  He states that normally he will just sleep wherever he can find a place to rest, but yesterday was white flag day.  He denies any  substance use. Assessment:   Given these findings, the patient's fluctuating mental state, combined with signs of neurological impairment and possible confabulation, warrants ongoing monitoring and further investigation into the potential causes of his presentation. His care team should consider exploring neuropsychological conditions, metabolic imbalances, and possible thrombotic events as part of the differential diagnosis.  Another concerning symptom is the patient's degree of confabulation, including statements about inappropriate sexual relations with physicians, which may point to cognitive dysfunction or disorganized thinking. In a recent interaction, the patient also made an unusual remark, saying, "You can sit with me for a while, but she would just have to steal while you are here," which further raises concerns about his cognitive processing and coherence.  Past Psychiatric History:  Dx: "depression, anxiety, alcohol [use disorder]" Denies h/o psych hosp Denies h/o suicide attempts  Risk to Self:  denies Risk to Others:  denies Prior Inpatient Therapy:  denies, chart review shows [facility] 07/2022 Prior Outpatient Therapy:  denies  Past Medical History:  Past Medical History:  Diagnosis Date   Actinic keratosis 11/27/2016   Alcohol abuse with alcohol-induced mood disorder (HCC) 07/18/2018   Alcohol use disorder, severe, dependence (HCC) 09/16/2006   05/22/2015 Argumentative, belligerent and verbally abusive to staff per his note from [place]    Alcoholism [facility])    Anxiety state 04/25/2018   Aortic atherosclerosis (HCC) 08/27/2022   Chronic low back pain 09/16/2006   Followed by NS.  Per his chart from [place], he fell off two storyhouse roof while cleaning his brother's gutters and sustained compression fracture of  L3, L4 and L5 and fracture of right transverse process at L3 and nondisplaced fracture of left glenoid rim many years ago. He had multiple imaging in  [place] including Lumbar MRI in 2016 which showed moderate to severe spinal canal stenos   Delirium tremens (HCC) 09/01/2017   Depression    Dry eye 11/23/2016   Foreign body in middle portion of esophagus 05/19/2022   Hiatal hernia 09/14/2016   Status Collis gastroplasty and Nissen's fundoplication on 04/29/2015 in [place]   History of delirium tremons 07/22/2020   DTs during 10/2016 08/2017. And 12/2017 admissions    History of pulmonary embolus (PE) 11/02/2016   Unprovoked 11/01/2016. Xarelto from 11/01/16 to 09/08/2017.   Hydrocele    Surgically corrected   Hypertension    Hypoalbuminemia due to protein-calorie malnutrition (HCC) 07/22/2020   Lumbar compression fracture (HCC)    Multiple rib fractures 07/22/2019   Left rib details XR: left ribs demonstrate multiple remote rib   Pancytopenia (HCC)    Rhabdomyolysis 07/22/2020   Skin cancer    exciced 2017   Tinea versicolor 06/08/2013   Tobacco use disorder 07/22/2020   Tooth infection 04/25/2018   Trigger finger, acquired 11/18/2012   Ulnar tunnel syndrome of right wrist 11/08/2012    Past Surgical History:  Procedure Laterality Date   BIOPSY  05/21/2022   Procedure: BIOPSY;  Surgeon: Lynann Bologna, MD;  Location: [facility] ENDOSCOPY;  Service: Gastroenterology;;   CATARACT EXTRACTION     ESOPHAGOGASTRODUODENOSCOPY (EGD) WITH PROPOFOL N/A 05/21/2022   Procedure: ESOPHAGOGASTRODUODENOSCOPY (EGD) WITH PROPOFOL;  Surgeon: Lynann Bologna, MD;  Location: [facility] ENDOSCOPY;  Service: Gastroenterology;  Laterality: N/A;   FOREIGN BODY REMOVAL N/A 05/21/2022   Procedure: FOREIGN BODY REMOVAL;  Surgeon: Lynann Bologna, MD;  Location: [facility] ENDOSCOPY;  Service: Gastroenterology;  Laterality: N/A;   HIATAL HERNIA REPAIR  2016   HYDROCELE EXCISION / REPAIR  2010   SKIN CANCER EXCISION Left    Excised 2017   Family History:  Family History  Problem Relation Age of Onset   Heart attack Father        died at 28 years   Dementia Father     Stroke Father    Cancer Sister    Colon cancer Neg Hx    Colon polyps Neg Hx    Esophageal cancer Neg Hx    Stomach cancer Neg Hx    Rectal cancer Neg Hx    Family Psychiatric  History: does not report  Social History:  Social History   Substance and Sexual Activity  Alcohol Use Yes   Alcohol/week: 43.0 standard drinks of alcohol   Types: 42 Cans of beer, 1 Standard drinks or equivalent per week     Social History   Substance and Sexual Activity  Drug Use Yes   Types: Marijuana    Social History   Socioeconomic History   Marital status: Single    Spouse name: Not on file   Number of children: Not on file   Years of education: Not on file   Highest education level: Not on file  Occupational History   Occupation: unemployed  Tobacco Use   Smoking status: Never   Smokeless tobacco: Current    Types: Snuff   Tobacco comments:    Pt states do snuff when stressed had 1 can last 3 month//PL  Vaping Use   Vaping status: Never Used  Substance and Sexual Activity   Alcohol use: Yes    Alcohol/week: 43.0 standard drinks of alcohol  Types: 42 Cans of beer, 1 Standard drinks or equivalent per week   Drug use: Yes    Types: Marijuana   Sexual activity: Yes    Birth control/protection: None  Other Topics Concern   Not on file  Social History Narrative   Lives with a friend. Has children but hasn't met them in a while. He doesn't know where they live.    Social Drivers of Corporate investment banker Strain: Not on file  Food Insecurity: Patient Unable To Answer (07/28/2023)   Hunger Vital Sign    Worried About Running Out of Food in the Last Year: Patient unable to answer    Ran Out of Food in the Last Year: Patient unable to answer  Recent Concern: Food Insecurity - Food Insecurity Present (07/08/2023)   Hunger Vital Sign    Worried About Programme researcher, broadcasting/film/video in the Last Year: Often true    Ran Out of Food in the Last Year: Often true  Transportation Needs: Patient  Unable To Answer (07/28/2023)   PRAPARE - Administrator, Civil Service (Medical): Patient unable to answer    Lack of Transportation (Non-Medical): Patient unable to answer  Recent Concern: Transportation Needs - Unmet Transportation Needs (07/08/2023)   PRAPARE - Administrator, Civil Service (Medical): Yes    Lack of Transportation (Non-Medical): Yes  Physical Activity: Not on file  Stress: Not on file  Social Connections: Patient Unable To Answer (07/28/2023)   Social Connection and Isolation Panel [NHANES]    Frequency of Communication with Friends and Family: Patient unable to answer    Frequency of Social Gatherings with Friends and Family: Patient unable to answer    Attends Religious Services: Patient unable to answer    Active Member of Clubs or Organizations: Patient unable to answer    Attends Banker Meetings: Patient unable to answer    Marital Status: Patient unable to answer   Additional Social History:    Allergies:   Allergies  Allergen Reactions   Other Itching and Other (See Comments)    Seasonal allergies- Itchy eyes, runny nose, congestion   Poison Ivy Extract Rash    Labs:  Results for orders placed or performed during the hospital encounter of 07/28/23 (from the past 48 hours)  Comprehensive metabolic panel     Status: Abnormal   Collection Time: 08/04/23  4:04 AM  Result Value Ref Range   Sodium 136 135 - 145 mmol/L   Potassium 3.8 3.5 - 5.1 mmol/L   Chloride 106 98 - 111 mmol/L   CO2 20 (L) 22 - 32 mmol/L   Glucose, Bld 85 70 - 99 mg/dL    Comment: Glucose reference range applies only to samples taken after fasting for at least 8 hours.   BUN 10 8 - 23 mg/dL   Creatinine, Ser 8.29 (L) 0.61 - 1.24 mg/dL   Calcium 9.0 8.9 - 56.2 mg/dL   Total Protein 6.5 6.5 - 8.1 g/dL   Albumin 2.7 (L) 3.5 - 5.0 g/dL   AST 26 15 - 41 U/L   ALT 14 0 - 44 U/L   Alkaline Phosphatase 91 38 - 126 U/L   Total Bilirubin 0.7 0.0 - 1.2  mg/dL   GFR, Estimated >13 >08 mL/min    Comment: (NOTE) Calculated using the CKD-EPI Creatinine Equation (2021)    Anion gap 10 5 - 15    Comment: Performed at [facility], [address] [place], [place] [address]  CBC with Differential/Platelet     Status: Abnormal   Collection Time: 08/04/23  4:04 AM  Result Value Ref Range   WBC 3.3 (L) 4.0 - 10.5 K/uL   RBC 3.18 (L) 4.22 - 5.81 MIL/uL   Hemoglobin 9.9 (L) 13.0 - 17.0 g/dL   HCT 19.1 (L) 47.8 - 29.5 %   MCV 98.4 80.0 - 100.0 fL   MCH 31.1 26.0 - 34.0 pg   MCHC 31.6 30.0 - 36.0 g/dL   RDW 62.1 (H) 30.8 - 65.7 %   Platelets 240 150 - 400 K/uL   nRBC 0.0 0.0 - 0.2 %   Neutrophils Relative % 39 %   Neutro Abs 1.3 (L) 1.7 - 7.7 K/uL   Lymphocytes Relative 40 %   Lymphs Abs 1.3 0.7 - 4.0 K/uL   Monocytes Relative 15 %   Monocytes Absolute 0.5 0.1 - 1.0 K/uL   Eosinophils Relative 4 %   Eosinophils Absolute 0.1 0.0 - 0.5 K/uL   Basophils Relative 2 %   Basophils Absolute 0.1 0.0 - 0.1 K/uL   Immature Granulocytes 0 %   Abs Immature Granulocytes 0.01 0.00 - 0.07 K/uL    Comment: Performed at [facility], [address], [place], [place] [address]  Phosphorus     Status: None   Collection Time: 08/04/23  4:04 AM  Result Value Ref Range   Phosphorus 4.2 2.5 - 4.6 mg/dL    Comment: Performed at [facility], [address], [place], [place] [address]  Magnesium     Status: Abnormal   Collection Time: 08/04/23  4:04 AM  Result Value Ref Range   Magnesium 1.5 (L) 1.7 - 2.4 mg/dL    Comment: Performed at [facility], [address], [place], [place] [address]  Comprehensive metabolic panel     Status: Abnormal   Collection Time: 08/05/23  3:58 AM  Result Value Ref Range   Sodium 132 (L) 135 - 145 mmol/L   Potassium 3.6 3.5 - 5.1 mmol/L   Chloride 102 98 - 111 mmol/L   CO2 19 (L) 22 - 32 mmol/L   Glucose, Bld 76 70 - 99 mg/dL    Comment: Glucose  reference range applies only to samples taken after fasting for at least 8 hours.   BUN 8 8 - 23 mg/dL   Creatinine, Ser 4.01 (L) 0.61 - 1.24 mg/dL   Calcium 8.8 (L) 8.9 - 10.3 mg/dL   Total Protein 6.8 6.5 - 8.1 g/dL   Albumin 2.9 (L) 3.5 - 5.0 g/dL   AST 26 15 - 41 U/L   ALT 16 0 - 44 U/L   Alkaline Phosphatase 92 38 - 126 U/L   Total Bilirubin 0.6 0.0 - 1.2 mg/dL   GFR, Estimated >02 >72 mL/min    Comment: (NOTE) Calculated using the CKD-EPI Creatinine Equation (2021)    Anion gap 11 5 - 15    Comment: Performed at [facility], [address] [place], [place] [address]  CBC     Status: Abnormal   Collection Time: 08/05/23  3:58 AM  Result Value Ref Range   WBC 3.2 (L) 4.0 - 10.5 K/uL   RBC 3.36 (L) 4.22 - 5.81 MIL/uL   Hemoglobin 10.7 (L) 13.0 - 17.0 g/dL   HCT 40.3 (L) 47.4 - 25.9 %   MCV 97.9 80.0 - 100.0 fL   MCH 31.8 26.0 - 34.0 pg  MCHC 32.5 30.0 - 36.0 g/dL   RDW 40.9 (H) 81.1 - 91.4 %   Platelets 269 150 - 400 K/uL   nRBC 0.0 0.0 - 0.2 %    Comment: Performed at [facility], [address], [place], [place] [address]  Magnesium     Status: Abnormal   Collection Time: 08/05/23  3:58 AM  Result Value Ref Range   Magnesium 1.4 (L) 1.7 - 2.4 mg/dL    Comment: Performed at [facility], [address] [place], [place] [address]  Phosphorus     Status: None   Collection Time: 08/05/23  3:58 AM  Result Value Ref Range   Phosphorus 3.8 2.5 - 4.6 mg/dL    Comment: Performed at [facility], [address], [place], [place] [address]    Current Facility-Administered Medications  Medication Dose Route Frequency Provider Last Rate Last Admin   acetaminophen (TYLENOL) tablet 650 mg  650 mg Oral Q6H PRN Kirby Crigler, Mir M, MD   650 mg at 08/01/23 2252   Or   acetaminophen (TYLENOL) suppository 650 mg  650 mg Rectal Q6H PRN Kirby Crigler, Mir M, MD       diltiazem (CARDIZEM) 125 mg in dextrose 5%  125 mL (1 mg/mL) infusion  5-15 mg/hr Intravenous Continuous [name], NP   Stopped at 08/04/23 1714   enoxaparin (LOVENOX) injection 65 mg  65 mg Subcutaneous Q12H Marguerita Merles [place], DO   65 mg at 08/04/23 2253   feeding supplement (ENSURE ENLIVE / ENSURE PLUS) liquid 237 mL  237 mL Oral BID BM Kirby Crigler, Mir M, MD   237 mL at 08/04/23 1552   folic acid (FOLVITE) tablet 1 mg  1 mg Oral Daily Sheikh, Omair [place], DO   1 mg at 08/02/23 0930   hydrALAZINE (APRESOLINE) injection 10 mg  10 mg Intravenous Q4H PRN Amin, Ankit C, MD       hydrOXYzine (ATARAX) tablet 25 mg  25 mg Oral TID PRN Maryagnes Amos, FNP   25 mg at 08/04/23 2256   ipratropium-albuterol (DUONEB) 0.5-2.5 (3) MG/3ML nebulizer solution 3 mL  3 mL Nebulization Q4H PRN Amin, Ankit C, MD       metoprolol succinate (TOPROL-XL) 24 hr tablet 50 mg  50 mg Oral Daily Leatha Gilding, MD   50 mg at 08/04/23 1553   metoprolol tartrate (LOPRESSOR) injection 5 mg  5 mg Intravenous Q4H PRN Amin, Ankit C, MD   5 mg at 08/03/23 0730   multivitamin with minerals tablet 1 tablet  1 tablet Oral Daily Marguerita Merles [place], DO   1 tablet at 08/02/23 0930   OLANZapine (ZYPREXA) injection 2.5 mg  2.5 mg Intramuscular Q6H PRN Charm Rings, NP   2.5 mg at 08/04/23 2254   ondansetron (ZOFRAN) injection 4 mg  4 mg Intravenous Q6H PRN Amin, Ankit C, MD       potassium chloride SA (KLOR-CON M) CR tablet 40 mEq  40 mEq Oral Once Sheikh, Omair Latif, DO       thiamine (VITAMIN B1) 500 mg in sodium chloride 0.9 % 50 mL IVPB  500 mg Intravenous Q24H Maryagnes Amos, FNP   Stopped at 08/04/23 1131   [START ON 08/07/2023] thiamine (VITAMIN B1) tablet 100 mg  100 mg Oral Daily Sheikh, Kateri [place], DO       Or   [START ON 08/07/2023] thiamine (VITAMIN B1) injection 100 mg  100 mg Intravenous Daily Marguerita Merles [place],  DO        Musculoskeletal: Strength & Muscle Tone: restless in his bed  Gait & Station: did not witness Patient leans:  None  Psychiatric Specialty Exam: Physical Exam Vitals reviewed.  Constitutional:      General: He is not in acute distress.    Appearance: He is not toxic-appearing.  HENT:     Head: Normocephalic.     Nose: Nose normal.  Eyes:     Comments: Drooping of the right eye noted on 08/04/2023.   Pulmonary:     Effort: Pulmonary effort is normal. No respiratory distress.  Musculoskeletal:     Cervical back: Normal range of motion.  Skin:    General: Skin is warm and dry.  Neurological:     Mental Status: He is alert.     Comments: sedated     Review of Systems  Reason unable to perform ROS: sedation.    Blood pressure 134/82, pulse 96, temperature 97.8 F (36.6 C), temperature source Oral, resp. rate 17, height 5\' 8"  (1.727 m), weight 64.7 kg, SpO2 97%.Body mass index is 21.69 kg/m.  General Appearance: Disheveled  Eye Contact:  Fair  Speech:  mumbles, incoherently  Volume:  Decreased  Mood:  Unable to assess, UTA  Affect:  blunted  Thought Process:  UTA  Orientation:  UTA  Thought Content:  UTA  Suicidal Thoughts:   UTA  Homicidal Thoughts:   UTA  Memory:  UTA  Judgement:  Impaired  Insight:  UTA  Psychomotor Activity:  Restless  Concentration:  UTA  Recall:  UTA  Fund of Knowledge:  UTA  Language:  Poor  Akathisia:  Negative  Handed:  Right  AIMS (if indicated):  UTA  Assets:  resiliency  ADL's:  impaired  Cognition:  Moderate io severe impairment  Sleep:  UTA     Physical Exam: Physical Exam Vitals reviewed.  Constitutional:      General: He is not in acute distress.    Appearance: He is not toxic-appearing.  HENT:     Head: Normocephalic.     Nose: Nose normal.  Eyes:     Comments: Drooping of the right eye noted on 08/04/2023.   Pulmonary:     Effort: Pulmonary effort is normal. No respiratory distress.  Musculoskeletal:     Cervical back: Normal range of motion.  Skin:    General: Skin is warm and dry.  Neurological:     Mental Status: He is  alert.     Comments: sedated    Review of Systems  Reason unable to perform ROS: sedation.   Blood pressure 134/82, pulse 96, temperature 97.8 F (36.6 C), temperature source Oral, resp. rate 17, height 5\' 8"  (1.727 m), weight 64.7 kg, SpO2 97%. Body mass index is 21.69 kg/m.  Treatment Plan Summary: Delirium  Alcohol use disorder, severe   Plan: -CIWA and ativan detox protocol -Continue with IV thiamine 500 mg daily x 7 days -pt would benefit from substance use treatment - social worker can offer available community options  -Consider reaching out to neurology for additional workup.  Agitation: Zyprexa 5 mg IM every six hours PRN decreased to 2.5 mg due to his delirium and need for an EKG, EKG ordered -Will start hydroxyzine 25 mg p.o. 3 times daily as needed  Insomnia: Open blinds, encouraged patient to stay awake during the day, may use hydroxyzine as needed for sleep and anxiety.  Per nursing hydroxyzine has worked well in the past.  Labs reviewed to include HIV, Hepatitis, respiratory panel, RPR, TSH, CMP (hypomagnesia 1.5), and hypoalbuminemia (2.7).  MRI motion degraded, patient remains a restless difficult to obtain good imaging at this time. -Recommend new imaging if eye droop is acute.   Psychiatry consult service will continue to follow at this time, [place] to no improvement.  Currently length of stay 7 days Disposition: No evidence of imminent risk to self or others at present.   Patient does not meet criteria for psychiatric inpatient admission. Supportive therapy provided about ongoing stressors. Discussed crisis plan, support from social network, calling 911, coming to the Emergency Department, and calling Suicide Hotline.  Patient will likely need SNF placement once medically clear.    Mariel Craft, MD 08/05/2023 9:50 AM

## 2023-08-06 DIAGNOSIS — R410 Disorientation, unspecified: Secondary | ICD-10-CM | POA: Diagnosis not present

## 2023-08-06 LAB — CBC
HCT: 32.2 % — ABNORMAL LOW (ref 39.0–52.0)
Hemoglobin: 10.4 g/dL — ABNORMAL LOW (ref 13.0–17.0)
MCH: 31.9 pg (ref 26.0–34.0)
MCHC: 32.3 g/dL (ref 30.0–36.0)
MCV: 98.8 fL (ref 80.0–100.0)
Platelets: 245 10*3/uL (ref 150–400)
RBC: 3.26 MIL/uL — ABNORMAL LOW (ref 4.22–5.81)
RDW: 17 % — ABNORMAL HIGH (ref 11.5–15.5)
WBC: 3.9 10*3/uL — ABNORMAL LOW (ref 4.0–10.5)
nRBC: 0 % (ref 0.0–0.2)

## 2023-08-06 MED ORDER — OLANZAPINE 5 MG PO TBDP
2.5000 mg | ORAL_TABLET | Freq: Every day | ORAL | Status: DC
  Administered 2023-08-06 – 2023-08-12 (×6): 2.5 mg via ORAL
  Filled 2023-08-06 (×7): qty 0.5

## 2023-08-06 NOTE — Progress Notes (Signed)
PROGRESS NOTE  Anthony A Manes Jr. ZOX:[id number] DOB: Apr 17, 1956 DOA: 07/28/2023 PCP: Pcp, No   LOS: 8 days   Brief Narrative / Interim history: 68 year old male with alcohol use, frequent withdrawals and delirium in the past, PAF, recent PE, not on anticoagulation medication due to homelessness and nonadherence to medical treatment, comes into the hospital from the shelter due to delirium.  Hospital course complicated by A-fib with RVR requiring a diltiazem drip.  He was also found to have a fever of 100.6 on 1/10, but afebrile since.  Cultures negative  Subjective / 24h Interval events: Remains confused  Assesement and Plan: Principal Problem:   Delirium Active Problems:   Alcohol abuse with alcohol-induced mood disorder (HCC)   Alcohol withdrawal delirium (HCC)   Agitation  Principal problem Acute metabolic encephalopathy, delirium, underlying EtOH use -patient has having episodes like this in the past, noted several times during prior hospitalizations and thought to be in the setting of alcohol withdrawal/DTs.   -Thiamine levels normal, but has received empirical high-dose thiamine -Ammonia normal, B12 on the lower end, provide B12 injection x 1 -MRI of the brain was motion degraded but no acute findings.  EEG did not show seizures or epileptiform discharges -RPR negative -Psychiatry consulted, appreciate input. -Discussed with the [facility] with neurology, case reviewed and images reviewed.  Given that he has been having intermittent confusions with prior hospitalizations, this may suggest an underlying neurodegenerative disorder possibly in the setting of longstanding EtOH use.  This is likely to have progressed.  Does not recall our additional testing or interventions that could be done at this point but weight and give him time -He does have this eye weakness, but it is not clear to me whether its acute or chronic.  Given lack of clarity when this started, will repeat brain  imaging  Active problems Recurrent pulmonary embolism-currently on Lovenox as he is refusing oral medications intermittently.  Most recent PE was seen December 2024.  On room air, no chest pain  Fever-unclear etiology, cultures are negative, has received 5 days of empiric antibiotics, discontinued 1/14.  He was initially on cefepime which was thought to may have contributed to #1, has been transitioned to Zosyn.  Currently monitor off antibiotics, remains afebrile  Essential hypertension-has been placed on metoprolol.  His blood pressure has remained stable  PAF, with RVR -continues to refuse oral metoprolol, currently on diltiazem infusion.  Chronic systolic CHF-EF 40-45%, global hypokinesis.  Cardiology consulted, GDMT is limited by noncompliance as well as intermittent hypotension.  Medical management  Hypokalemia, hypomagnesemia-continue to monitor and replace as appropriate  Hyponatremia-resolved  Macrocytic anemia-monitor, no bleeding  Scheduled Meds:  enoxaparin (LOVENOX) injection  65 mg Subcutaneous Q12H   feeding supplement  237 mL Oral BID BM   folic acid  1 mg Oral Daily   metoprolol succinate  50 mg Oral Daily   multivitamin with minerals  1 tablet Oral Daily   potassium chloride  40 mEq Oral Once   [START ON 08/07/2023] thiamine  100 mg Oral Daily   Or   [START ON 08/07/2023] thiamine  100 mg Intravenous Daily   Continuous Infusions:  thiamine (VITAMIN B1) injection 500 mg (08/05/23 1128)   PRN Meds:.acetaminophen **OR** acetaminophen, hydrALAZINE, hydrOXYzine, ipratropium-albuterol, metoprolol tartrate, OLANZapine, ondansetron (ZOFRAN) IV  Current Outpatient Medications  Medication Instructions   acetaminophen (TYLENOL) 500 mg, Oral, Every 8 hours PRN   apixaban (ELIQUIS) 5 MG TABS tablet Take 2 tablets daily (10 mg total) twice daily for  4 days then on Dec 27, reduce to 1 tablet (5 mg total) twice daily.   FeroSul 325 mg, Oral, Daily with breakfast   folic acid  (FOLVITE) 1 mg, Oral, Daily   magnesium oxide (MAG-OX) 400 mg, Oral, 2 times daily   metoprolol tartrate (LOPRESSOR) 25 mg, Oral, 2 times daily   mometasone-formoterol (DULERA) 200-5 MCG/ACT AERO 2 puffs, Inhalation, 2 times daily   pantoprazole (PROTONIX) 40 mg, Oral, Daily   rosuvastatin (CRESTOR) 10 mg, Oral, Daily   thiamine (VITAMIN B1) 100 mg, Oral, Daily    Diet Orders (From admission, onward)     Start     Ordered   08/04/23 1400  Diet regular Fluid consistency: Thin  Diet effective now       Question:  Fluid consistency:  Answer:  Thin   08/04/23 1359            DVT prophylaxis: SCDs Start: 07/28/23 1549   Lab Results  Component Value Date   PLT 245 08/06/2023      Code Status: Full Code  Family Communication: no family at bedside   Status is: Inpatient Remains inpatient appropriate because: severity of illness  Level of care: Progressive  Consultants:  Psychiatry   Objective: Vitals:   08/04/23 1400 08/05/23 0344 08/05/23 2142 08/06/23 0611  BP: 119/74 134/82 137/86 120/70  Pulse:    89  Resp: 17  16 18   Temp:  97.8 F (36.6 C) 97.7 F (36.5 C) (!) 97.5 F (36.4 C)  TempSrc:  Oral  Oral  SpO2:   100%   Weight:      Height:        Intake/Output Summary (Last 24 hours) at 08/06/2023 1046 Last data filed at 08/06/2023 0532 Gross per 24 hour  Intake 240 ml  Output 1200 ml  Net -960 ml   Wt Readings from Last 3 Encounters:  07/28/23 64.7 kg  07/12/23 67.1 kg  03/06/23 64 kg    Examination:  Constitutional: NAD Eyes: lids and conjunctivae normal, no scleral icterus ENMT: mmm Neck: normal, supple Respiratory: clear to auscultation bilaterally, no wheezing, no crackles. Normal respiratory effort.  Cardiovascular: Regular rate and rhythm, no murmurs / rubs / gallops. No LE edema. Abdomen: soft, no distention, no tenderness. Bowel sounds positive.    Data Reviewed: I have independently reviewed following labs and imaging studies    CBC Recent Labs  Lab 08/01/23 0301 08/02/23 0417 08/03/23 0412 08/04/23 0404 08/05/23 0358 08/06/23 0414  WBC 7.4 5.9 4.0 3.3* 3.2* 3.9*  HGB 10.0* 9.5* 10.4* 9.9* 10.7* 10.4*  HCT 30.2* 29.1* 33.2* 31.3* 32.9* 32.2*  PLT 211 213 243 240 269 245  MCV 98.1 99.3 102.8* 98.4 97.9 98.8  MCH 32.5 32.4 32.2 31.1 31.8 31.9  MCHC 33.1 32.6 31.3 31.6 32.5 32.3  RDW 17.8* 17.4* 17.2* 17.1* 17.2* 17.0*  LYMPHSABS 0.9 1.1 1.1 1.3  --   --   MONOABS 0.8 0.8 0.5 0.5  --   --   EOSABS 0.0 0.2 0.2 0.1  --   --   BASOSABS 0.0 0.0 0.1 0.1  --   --     Recent Labs  Lab 07/31/23 1816 08/01/23 0301 08/02/23 0417 08/03/23 0412 08/04/23 0404 08/05/23 0358  NA  --  129* 135 135 136 132*  K  --  4.2 3.7 4.0 3.8 3.6  CL  --  104 111 110 106 102  CO2  --  16* 18* 18* 20* 19*  GLUCOSE  --  100* 96 101* 85 76  BUN  --  12 11 10 10 8   CREATININE  --  0.52* 0.36* 0.58* 0.47* 0.50*  CALCIUM  --  8.3* 8.9 8.8* 9.0 8.8*  AST  --  26 21 24 26 26   ALT  --  12 12 13 14 16   ALKPHOS  --  79 80 90 91 92  BILITOT  --  1.5* 1.1 0.9 0.7 0.6  ALBUMIN  --  2.8* 2.8* 2.7* 2.7* 2.9*  MG  --  2.1 1.7 1.5* 1.5* 1.4*  INR 1.5*  --   --   --   --   --     ------------------------------------------------------------------------------------------------------------------ No results for input(s): "CHOL", "HDL", "LDLCALC", "TRIG", "CHOLHDL", "LDLDIRECT" in the last 72 hours.  Lab Results  Component Value Date   HGBA1C 5.2 02/06/2023   ------------------------------------------------------------------------------------------------------------------ No results for input(s): "TSH", "T4TOTAL", "T3FREE", "THYROIDAB" in the last 72 hours.  Invalid input(s): "FREET3"  Cardiac Enzymes No results for input(s): "CKMB", "TROPONINI", "MYOGLOBIN" in the last 168 hours.  Invalid input(s): "CK" ------------------------------------------------------------------------------------------------------------------     Component Value Date/Time   BNP 754.3 (H) 07/12/2023 1801    CBG: No results for input(s): "GLUCAP" in the last 168 hours.  Recent Results (from the past 240 hours)  Culture, blood (Routine X 2) w Reflex to ID Panel     Status: None   Collection Time: 07/30/23  8:07 PM   Specimen: BLOOD RIGHT ARM  Result Value Ref Range Status   Specimen Description   Final    BLOOD RIGHT ARM AEROBIC BOTTLE ONLY Performed at [facility], [address], [place], [place] [address]    Special Requests   Final    Blood Culture results may not be optimal due to an inadequate volume of blood received in culture bottles BOTTLES DRAWN AEROBIC ONLY Performed at [facility], [address], [place], [place] [address]    Culture   Final    NO GROWTH 5 DAYS Performed at [facility], [address], [place], [place] [address]    Report Status 08/05/2023 FINAL  Final  Culture, blood (Routine X 2) w Reflex to ID Panel     Status: None   Collection Time: 07/30/23  8:07 PM   Specimen: BLOOD RIGHT ARM  Result Value Ref Range Status   Specimen Description   Final    BLOOD RIGHT ARM AEROBIC BOTTLE ONLY Performed at [facility], [address] [place], [place] [address]    Special Requests   Final    Blood Culture results may not be optimal due to an inadequate volume of blood received in culture bottles BOTTLES DRAWN AEROBIC ONLY Performed at [facility], [address], [place], [place] [address]    Culture   Final    NO GROWTH 5 DAYS Performed at [facility], [address], [place], [place] [address]    Report Status 08/05/2023 FINAL  Final     Radiology Studies: No results found.   Pamella Pert, MD, PhD [facility]  Between 7 am - 7 pm I am available, please contact me via Amion (for emergencies) or Securechat (non urgent messages)  Between 7 pm - 7 am I am not available, please contact  night coverage MD/APP via Amion

## 2023-08-06 NOTE — Progress Notes (Signed)
Occupational Therapy Treatment Patient Details Name: Anthony A Whorley Jr. MRN: [id number] DOB: Oct 27, 1955 Today's Date: 08/06/2023   History of present illness Pt is a 68 y/o male who presented to the hospital on 07/28/23 after being kicked out of a shelter 2/2 bothering others there, EMS was called & pt was brought back to the ER. Pt has had multiple ER visits since the end of December c/o of being cold, erratic behavior & then leaving AMA. PMH: alcohol abuse & prior DTs, multiple PE, homelessness, anxiety, depression, chronic LBP, hiatal hernia, hydrocele   OT comments  Pt making gradual progress towards goals; more conversationally appropriate this date but lethargy still impacting progress. Performing grooming tasks with mod cuing for sequencing and attention. OOB mobility deferred 2/2 level of arousal. OT will continue to follow. Recommendations are for continued inpatient follow up therapy, <3 hours/day however barriers to discharge present, indicate that pt will be returning to shelter due to lack of d/c plan after SNF.       If plan is discharge home, recommend the following:  A [place] help with walking and/or transfers;A [place] help with bathing/dressing/bathroom;Assistance with cooking/housework;Assist for transportation;Supervision due to cognitive status   Equipment Recommendations  Other (comment)       Precautions / Restrictions Precautions Precautions: Fall Restrictions Weight Bearing Restrictions Per Provider Order: No       Mobility Bed Mobility Overal bed mobility: Needs Assistance Bed Mobility: Rolling           General bed mobility comments: minA to scoot up in bed    Transfers                             ADL either performed or assessed with clinical judgement   ADL Overall ADL's : Needs assistance/impaired     Grooming: Wash/dry hands;Wash/dry face;Brushing hair;Bed level Grooming Details (indicate cue type and reason): [place] cuing to attend to  task                               General ADL Comments: Pt with intermittent lethargy and restlessness. Recieved trying to get OOB, but redirects easily with alarm activated and OT entering room. Once OT enterts, pt closes eyes saying he needs to sleep. Awakens enough for bed level ADLs      Cognition Arousal: Lethargic Behavior During Therapy: Restless, Impulsive Overall Cognitive Status: Impaired/Different from baseline Area of Impairment: Following commands, Safety/judgement, Problem solving                       Following Commands: Follows multi-step commands inconsistently, Follows one step commands with increased time Safety/Judgement: Decreased awareness of deficits, Decreased awareness of safety   Problem Solving: Slow processing, Difficulty sequencing, Requires verbal cues, Requires tactile cues General Comments: pt required constant redirection, gentle coaxing required for pt to return to bed, maintained mittens                   Pertinent Vitals/ Pain       Pain Assessment Pain Assessment: No/denies pain   Frequency  Min 1X/week        Progress Toward Goals  OT Goals(current goals can now be found in the care plan section)  Progress towards OT goals: Progressing toward goals  Acute Rehab OT Goals OT Goal Formulation: Patient unable to participate in goal setting Time For Goal  Achievement: 08/12/23 Potential to Achieve Goals: Fair  Plan         AM-PAC OT "6 Clicks" Daily Activity     Outcome Measure   Help from another person eating meals?: None Help from another person taking care of personal grooming?: A Noy Help from another person toileting, which includes using toliet, bedpan, or urinal?: A Rudman Help from another person bathing (including washing, rinsing, drying)?: A Cocking Help from another person to put on and taking off regular upper body clothing?: A L[place]ter Help from another person to put on and taking off  regular lower body clothing?: A [place] 6 Click Score: 19    End of Session    OT Visit Diagnosis: Unsteadiness on feet (R26.81);Muscle weakness (generalized) (M62.81);Other symptoms and signs involving cognitive function   Activity Tolerance Patient limited by lethargy   Patient Left with restraints reapplied;with bed alarm set;in bed (mitts on bilat)   Nurse Communication Mobility status        Time: 4742-5956 OT Time Calculation (min): 17 min  Charges: OT General Charges $OT Visit: 1 Visit OT Treatments $Self Care/Home Management : 8-22 mins  Sandrina Heaton L. Lasheika Ortloff, OTR/L  08/06/23, 1:05 PM

## 2023-08-06 NOTE — Plan of Care (Signed)
  Problem: Clinical Measurements: Goal: Diagnostic test results will improve Outcome: Progressing Goal: Respiratory complications will improve Outcome: Progressing Goal: Cardiovascular complication will be avoided Outcome: Progressing   Problem: Safety: Goal: Ability to remain free from injury will improve Outcome: Progressing   

## 2023-08-06 NOTE — Consult Note (Signed)
Face-to-Face Psychiatry Consult   Reason for Consult:  significant recurrent agitation. Chronic. Would benefit from chronic medications Referring Physician:  Dr. Nelson Chimes  Patient Identification: Anthony [name]. MRN:  [id number] Principal Diagnosis: Delirium Diagnosis:  Principal Problem:   Delirium Active Problems:   Alcohol abuse with alcohol-induced mood disorder (HCC)   Alcohol withdrawal delirium (HCC)   Agitation   Total Time spent with patient: 35 minutes  Subjective:   Mr. Anthony [name] is a 68y/o male with a psychiatric history significant for alcohol use disorder, depression and anxiety who presents to the ED with complaint of AMS.   The patient's current presentation is consistent with hypoactive delirium versus metabolic encephalopathy. He displays signs of disorientation, confusion, restlessness, and fluctuating mental status, which are indicative of disturbed sensorium. However, there are ongoing concerns regarding potential underlying etiologies, such as neuropsychological conditions or a thrombotic event. While the patient is well outside the alcohol detox window and his B1 levels are within normal limits, he continues to receive B1 transfusions as part of his care. His condition remains complex, and further evaluation is needed to rule out other neurological or systemic causes. The patient continues to exhibit waxing and waning dysarthria and slurred speech, which complicate communication. Additionally, there appears to be some right eye muscle weakness, which may be indicative of a "lazy eye," further suggesting possible neurological involvement. Despite these challenges, he is able to move all extremities and grip hands with bilateral mittens in place, which suggests preserved motor function to some degree.  08/06/2023:  Patient is awake upon arrival for assessment this morning.  He has his breakfast tray at bedside and it is 100% eaten.  Patient is communicative with soft voice but  minimally sensible conversation.  He states, "I slept pretty good."  He is agreeable to staying awake during the day.  He is pleasant.  He continues to wear soft mitts.  He does not have a sitter at bedside. MAR documents patient receiving IM Zyprexa at 2335 last night and at night before.  Will transition to p.o. Zyprexa tonight and reevaluate.   08/05/2023: Patient sedated and minimally arousable to voice, touch and sternal tap. MAR documents that he has received Zyprexa IM the past 2 evenings. Nurses report that he has been more agitated and requires soft mitts and a sitter at his bedside.  No concerns expressed from sitter. Reports that he was awake early this morning. Drank 240 ml and ate 50% of his breakfast.  Nurses said that he has been wide awake all day yesterday, and did not sleep well. Oncoming day nurse was told "If he sleeps, he will sleep all day". Last EKG 07/31/2023 A-fib with Premature ventricular or aberrantly conducted complexes, nonspecific ST and T wave abnormalities, QTc 494. Ordered a new EKG to be completed today. Delirium precautions reviewed.  I have encouraged sitter to keep the patient awake and attempt to get him into a chair. No known family members have been contacted..  Nurses state that no one has been to visit.  08/04/2023 Per nursing report patient has episodes of looseness, that fluctuates with agitation and aggression at times.  We did discuss possible etiologies and contributions to current state of delirium and confusion.  Nursing is encouraged to ambulate patient and consider nonpharmacological interventions to include awake during daytime, opening the blinds, and out of bed to chair, reducing use of restraints and soft mittens.  She reports patient continues to make multiple attempts to get out of bed and  difficult to redirect.  Nursing is encouraged to document suggested findings.  08/03/2023 Group  Credited to [employer] [facility]NP on initial  assessment on 1/10./2025: [name] Is a 68 year old male patient with a medical history significant for alcohol abuse, prior delirium tremens (DTs), multiple pulmonary emboli, documented homelessness, and medication noncompliance is being admitted for delirium. Unfortunately, he has had several recent ER visits since late December, presenting with complaints of feeling cold and erratic behavior, but he has left against medical advice each time. Upon initial documentation, the patient was observed ambulating, but today, during evaluation, he was found in bed with bilateral soft wrist restraints. He is disoriented, alert to himself and his location, but unaware of the time or situation. Some confabulation, disturbed sensorium, restlessness, and ongoing irritability were noted. At one point, he became defensive, stating, "I ain't been drinking no damn alcohol," when asked about alcohol use. He was wearing a male PureWick device, which he continued to pick at, despite it being properly placed. Initially, the patient was highly disoriented, but after several hours, he became more verbal, oriented, and cooperative. However, he remains extremely weak, unsteady, and clearly unable to care for himself. His current presentation is consistent with delirium tremens, and he is at high risk for Wernicke's encephalopathy. Treatment will include high-dose IV thiamine.  HPI:  Anthony [name]. is a 68 y.o. male. Admitted on 07/28/2023: 68 year old male with a history of alcohol abuse, hypertension, homelessness, CVA, chronic heart failure, atrial fibrillation, history of PE in July 2024 comes in with chief complaint of homelessness.   Patient was seen in the ER multiple times between 1/6-1/8.  It appears that he was sent to the [facility], likely moved to the [facility] and they had him leave because he was loud. Patient has no complaints from his side. Upon further questioning, he said he has pain all over.  He  confirms he is homeless.  He is oriented to self, location but thinks it is 68.  He states that normally he will just sleep wherever he can find a place to rest, but yesterday was white flag day.  He denies any substance use. Assessment:   Given these findings, the patient's fluctuating mental state, combined with signs of neurological impairment and possible confabulation, warrants ongoing monitoring and further investigation into the potential causes of his presentation. His care team should consider exploring neuropsychological conditions, metabolic imbalances, and possible thrombotic events as part of the differential diagnosis.  Another concerning symptom is the patient's degree of confabulation, including statements about inappropriate sexual relations with physicians, which may point to cognitive dysfunction or disorganized thinking. In a recent interaction, the patient also made an unusual remark, saying, "You can sit with me for a while, but she would just have to steal while you are here," which further raises concerns about his cognitive processing and coherence.  Past Psychiatric History:  Dx: "depression, anxiety, alcohol [use disorder]" Denies h/o psych hosp Denies h/o suicide attempts  Risk to Self:  denies Risk to Others:  denies Prior Inpatient Therapy:  denies, chart review shows [facility] 07/2022 Prior Outpatient Therapy:  denies  Past Medical History:  Past Medical History:  Diagnosis Date   Actinic keratosis 11/27/2016   Alcohol abuse with alcohol-induced mood disorder (HCC) 07/18/2018   Alcohol use disorder, severe, dependence (HCC) 09/16/2006   05/22/2015 Argumentative, belligerent and verbally abusive to staff per his note from [place]    Alcoholism [facility])  Anxiety state 04/25/2018   Aortic atherosclerosis (HCC) 08/27/2022   Chronic low back pain 09/16/2006   Followed by NS.  Per his chart from [place], he fell off two storyhouse roof while cleaning his  brother's gutters and sustained compression fracture of L3, L4 and L5 and fracture of right transverse process at L3 and nondisplaced fracture of left glenoid rim many years ago. He had multiple imaging in [place] including Lumbar MRI in 2016 which showed moderate to severe spinal canal stenos   Delirium tremens (HCC) 09/01/2017   Depression    Dry eye 11/23/2016   Foreign body in middle portion of esophagus 05/19/2022   Hiatal hernia 09/14/2016   Status Collis gastroplasty and Nissen's fundoplication on 04/29/2015 in [place]   History of delirium tremons 07/22/2020   DTs during 10/2016 08/2017. And 12/2017 admissions    History of pulmonary embolus (PE) 11/02/2016   Unprovoked 11/01/2016. Xarelto from 11/01/16 to 09/08/2017.   Hydrocele    Surgically corrected   Hypertension    Hypoalbuminemia due to protein-calorie malnutrition (HCC) 07/22/2020   Lumbar compression fracture (HCC)    Multiple rib fractures 07/22/2019   Left rib details XR: left ribs demonstrate multiple remote rib   Pancytopenia (HCC)    Rhabdomyolysis 07/22/2020   Skin cancer    exciced 2017   Tinea versicolor 06/08/2013   Tobacco use disorder 07/22/2020   Tooth infection 04/25/2018   Trigger finger, acquired 11/18/2012   Ulnar tunnel syndrome of right wrist 11/08/2012    Past Surgical History:  Procedure Laterality Date   BIOPSY  05/21/2022   Procedure: BIOPSY;  Surgeon: Lynann Bologna, MD;  Location: [facility] ENDOSCOPY;  Service: Gastroenterology;;   CATARACT EXTRACTION     ESOPHAGOGASTRODUODENOSCOPY (EGD) WITH PROPOFOL N/A 05/21/2022   Procedure: ESOPHAGOGASTRODUODENOSCOPY (EGD) WITH PROPOFOL;  Surgeon: Lynann Bologna, MD;  Location: [facility] ENDOSCOPY;  Service: Gastroenterology;  Laterality: N/A;   FOREIGN BODY REMOVAL N/A 05/21/2022   Procedure: FOREIGN BODY REMOVAL;  Surgeon: Lynann Bologna, MD;  Location: [facility] ENDOSCOPY;  Service: Gastroenterology;  Laterality: N/A;   HIATAL HERNIA REPAIR  2016   HYDROCELE EXCISION  / REPAIR  2010   SKIN CANCER EXCISION Left    Excised 2017   Family History:  Family History  Problem Relation Age of Onset   Heart attack Father        died at 45 years   Dementia Father    Stroke Father    Cancer Sister    Colon cancer Neg Hx    Colon polyps Neg Hx    Esophageal cancer Neg Hx    Stomach cancer Neg Hx    Rectal cancer Neg Hx    Family Psychiatric  History: does not report  Social History:  Social History   Substance and Sexual Activity  Alcohol Use Yes   Alcohol/week: 43.0 standard drinks of alcohol   Types: 42 Cans of beer, 1 Standard drinks or equivalent per week     Social History   Substance and Sexual Activity  Drug Use Yes   Types: Marijuana    Social History   Socioeconomic History   Marital status: Single    Spouse name: Not on file   Number of children: Not on file   Years of education: Not on file   Highest education level: Not on file  Occupational History   Occupation: unemployed  Tobacco Use   Smoking status: Never   Smokeless tobacco: Current    Types: Snuff   Tobacco comments:  Pt states do snuff when stressed had 1 can last 3 month//PL  Vaping Use   Vaping status: Never Used  Substance and Sexual Activity   Alcohol use: Yes    Alcohol/week: 43.0 standard drinks of alcohol    Types: 42 Cans of beer, 1 Standard drinks or equivalent per week   Drug use: Yes    Types: Marijuana   Sexual activity: Yes    Birth control/protection: None  Other Topics Concern   Not on file  Social History Narrative   Lives with a friend. Has children but hasn't met them in a while. He doesn't know where they live.    Social Drivers of Corporate investment banker Strain: Not on file  Food Insecurity: Patient Unable To Answer (07/28/2023)   Hunger Vital Sign    Worried About Running Out of Food in the Last Year: Patient unable to answer    Ran Out of Food in the Last Year: Patient unable to answer  Recent Concern: Food Insecurity - Food  Insecurity Present (07/08/2023)   Hunger Vital Sign    Worried About Programme researcher, broadcasting/film/video in the Last Year: Often true    Ran Out of Food in the Last Year: Often true  Transportation Needs: Patient Unable To Answer (07/28/2023)   PRAPARE - Administrator, Civil Service (Medical): Patient unable to answer    Lack of Transportation (Non-Medical): Patient unable to answer  Recent Concern: Transportation Needs - Unmet Transportation Needs (07/08/2023)   PRAPARE - Administrator, Civil Service (Medical): Yes    Lack of Transportation (Non-Medical): Yes  Physical Activity: Not on file  Stress: Not on file  Social Connections: Patient Unable To Answer (07/28/2023)   Social Connection and Isolation Panel [NHANES]    Frequency of Communication with Friends and Family: Patient unable to answer    Frequency of Social Gatherings with Friends and Family: Patient unable to answer    Attends Religious Services: Patient unable to answer    Active Member of Clubs or Organizations: Patient unable to answer    Attends Banker Meetings: Patient unable to answer    Marital Status: Patient unable to answer   Additional Social History:    Allergies:   Allergies  Allergen Reactions   Other Itching and Other (See Comments)    Seasonal allergies- Itchy eyes, runny nose, congestion   Poison Ivy Extract Rash    Labs:  Results for orders placed or performed during the hospital encounter of 07/28/23 (from the past 48 hours)  Comprehensive metabolic panel     Status: Abnormal   Collection Time: 08/05/23  3:58 AM  Result Value Ref Range   Sodium 132 (L) 135 - 145 mmol/L   Potassium 3.6 3.5 - 5.1 mmol/L   Chloride 102 98 - 111 mmol/L   CO2 19 (L) 22 - 32 mmol/L   Glucose, Bld 76 70 - 99 mg/dL    Comment: Glucose reference range applies only to samples taken after fasting for at least 8 hours.   BUN 8 8 - 23 mg/dL   Creatinine, Ser 8.75 (L) 0.61 - 1.24 mg/dL   Calcium 8.8  (L) 8.9 - 10.3 mg/dL   Total Protein 6.8 6.5 - 8.1 g/dL   Albumin 2.9 (L) 3.5 - 5.0 g/dL   AST 26 15 - 41 U/L   ALT 16 0 - 44 U/L   Alkaline Phosphatase 92 38 - 126 U/L   Total  Bilirubin 0.6 0.0 - 1.2 mg/dL   GFR, Estimated >72 >53 mL/min    Comment: (NOTE) Calculated using the CKD-EPI Creatinine Equation (2021)    Anion gap 11 5 - 15    Comment: Performed at [facility], [address], [place], [place] [address]  CBC     Status: Abnormal   Collection Time: 08/05/23  3:58 AM  Result Value Ref Range   WBC 3.2 (L) 4.0 - 10.5 K/uL   RBC 3.36 (L) 4.22 - 5.81 MIL/uL   Hemoglobin 10.7 (L) 13.0 - 17.0 g/dL   HCT 34.7 (L) 42.5 - 95.6 %   MCV 97.9 80.0 - 100.0 fL   MCH 31.8 26.0 - 34.0 pg   MCHC 32.5 30.0 - 36.0 g/dL   RDW 38.7 (H) 56.4 - 33.2 %   Platelets 269 150 - 400 K/uL   nRBC 0.0 0.0 - 0.2 %    Comment: Performed at [facility], [address] [place], [place] [address]  Magnesium     Status: Abnormal   Collection Time: 08/05/23  3:58 AM  Result Value Ref Range   Magnesium 1.4 (L) 1.7 - 2.4 mg/dL    Comment: Performed at [facility], [address] [place], [place] [address]  Phosphorus     Status: None   Collection Time: 08/05/23  3:58 AM  Result Value Ref Range   Phosphorus 3.8 2.5 - 4.6 mg/dL    Comment: Performed at [facility], [address], [place], [place] [address]  Cortisol     Status: None   Collection Time: 08/05/23 10:48 AM  Result Value Ref Range   Cortisol, Plasma 7.0 ug/dL    Comment: (NOTE) AM    6.7 - 22.6 ug/dL PM   <01.0       ug/dL Performed at [facility], [address] [place], [place] [address]   CBC     Status: Abnormal   Collection Time: 08/06/23  4:14 AM  Result Value Ref Range   WBC 3.9 (L) 4.0 - 10.5 K/uL   RBC 3.26 (L) 4.22 - 5.81 MIL/uL   Hemoglobin 10.4 (L) 13.0 - 17.0 g/dL   HCT 57.3 (L) 22.0 - 25.4 %   MCV 98.8 80.0 - 100.0 fL   MCH 31.9  26.0 - 34.0 pg   MCHC 32.3 30.0 - 36.0 g/dL   RDW 27.0 (H) 62.3 - 76.2 %   Platelets 245 150 - 400 K/uL   nRBC 0.0 0.0 - 0.2 %    Comment: Performed at [facility], [address], [place], [place] [address]    Current Facility-Administered Medications  Medication Dose Route Frequency Provider Last Rate Last Admin   acetaminophen (TYLENOL) tablet 650 mg  650 mg Oral Q6H PRN Kirby Crigler, Mir M, MD   650 mg at 08/01/23 2252   Or   acetaminophen (TYLENOL) suppository 650 mg  650 mg Rectal Q6H PRN Kirby Crigler, Mir M, MD       enoxaparin (LOVENOX) injection 65 mg  65 mg Subcutaneous Q12H Marguerita Merles Kiel, DO   65 mg at 08/05/23 2136   feeding supplement (ENSURE ENLIVE / ENSURE PLUS) liquid 237 mL  237 mL Oral BID BM Kirby Crigler, Mir M, MD   237 mL at 08/04/23 1552   folic acid (FOLVITE) tablet 1 mg  1 mg Oral Daily Marguerita Merles [place], DO   1 mg at 08/02/23 0930   hydrALAZINE (APRESOLINE) injection  10 mg  10 mg Intravenous Q4H PRN Amin, Ankit C, MD       hydrOXYzine (ATARAX) tablet 25 mg  25 mg Oral TID PRN Maryagnes Amos, FNP   25 mg at 08/05/23 2335   ipratropium-albuterol (DUONEB) 0.5-2.5 (3) MG/3ML nebulizer solution 3 mL  3 mL Nebulization Q4H PRN Amin, Ankit C, MD       metoprolol succinate (TOPROL-XL) 24 hr tablet 50 mg  50 mg Oral Daily Leatha Gilding, MD   50 mg at 08/04/23 1553   metoprolol tartrate (LOPRESSOR) injection 5 mg  5 mg Intravenous Q4H PRN Amin, Ankit C, MD   5 mg at 08/03/23 0730   multivitamin with minerals tablet 1 tablet  1 tablet Oral Daily Marguerita Merles [place], DO   1 tablet at 08/02/23 0930   OLANZapine (ZYPREXA) injection 2.5 mg  2.5 mg Intramuscular Q6H PRN Charm Rings, NP   2.5 mg at 08/05/23 2335   ondansetron (ZOFRAN) injection 4 mg  4 mg Intravenous Q6H PRN Amin, Ankit C, MD       potassium chloride SA (KLOR-CON M) CR tablet 40 mEq  40 mEq Oral Once Sheikh, Omair Latif, DO       thiamine (VITAMIN B1) 500 mg in sodium chloride  0.9 % 50 mL IVPB  500 mg Intravenous Q24H Maryagnes Amos, FNP 110 mL/hr at 08/05/23 1128 500 mg at 08/05/23 1128   [START ON 08/07/2023] thiamine (VITAMIN B1) tablet 100 mg  100 mg Oral Daily Sheikh, Kateri [place], DO       Or   [START ON 08/07/2023] thiamine (VITAMIN B1) injection 100 mg  100 mg Intravenous Daily Merlene Laughter, DO        Musculoskeletal: Strength & Muscle Tone: restless in his bed  Gait & Station: did not witness Patient leans: None  Psychiatric Specialty Exam: Physical Exam Vitals reviewed.  Constitutional:      General: He is not in acute distress.    Appearance: He is not toxic-appearing.  HENT:     Head: Normocephalic.     Nose: Nose normal.  Eyes:     Comments: Drooping of the right eye noted on 08/04/2023.   Pulmonary:     Effort: Pulmonary effort is normal. No respiratory distress.  Musculoskeletal:     Cervical back: Normal range of motion.  Skin:    General: Skin is warm and dry.  Neurological:     Mental Status: He is alert.     Review of Systems  Psychiatric/Behavioral:  Positive for confusion.     Blood pressure 120/70, pulse 89, temperature (!) 97.5 F (36.4 C), temperature source Oral, resp. rate 18, height 5\' 8"  (1.727 m), weight 64.7 kg, SpO2 100%.Body mass index is 21.69 kg/m.  General Appearance: Casual  Eye Contact:  Fair  Speech:  mumbles  Volume:  Decreased  Mood:  pleasant  Affect:  blunted  Thought Process:  UTA  Orientation:  disoriented  Thought Content:  UTA  Suicidal Thoughts:   UTA  Homicidal Thoughts:   UTA  Memory:  impaired  Judgement:  Impaired  Insight:  UTA  Psychomotor Activity:  Restless  Concentration:  fair  Recall:  [employer] of Knowledge:  UTA  Language:  Fair  Akathisia:  Negative  Handed:  Right  AIMS (if indicated):  UTA  Assets:  resiliency  ADL's:  impaired  Cognition:  Moderate io severe impairment  Sleep:  UTA     Physical Exam:  Physical Exam Vitals reviewed.   Constitutional:      General: He is not in acute distress.    Appearance: He is not toxic-appearing.  HENT:     Head: Normocephalic.     Nose: Nose normal.  Eyes:     Comments: Drooping of the right eye noted on 08/04/2023.   Pulmonary:     Effort: Pulmonary effort is normal. No respiratory distress.  Musculoskeletal:     Cervical back: Normal range of motion.  Skin:    General: Skin is warm and dry.  Neurological:     Mental Status: He is alert.    Review of Systems  Psychiatric/Behavioral:  Positive for confusion.    Blood pressure 120/70, pulse 89, temperature (!) 97.5 F (36.4 C), temperature source Oral, resp. rate 18, height 5\' 8"  (1.727 m), weight 64.7 kg, SpO2 100%. Body mass index is 21.69 kg/m.  Treatment Plan Summary: Delirium Alcohol use disorder, severe   Plan: -CIWA and ativan detox protocol completed -Continue with PO or IV thiamine 100 mg daily   No medication changes.  Likely will need a TOC consult for placement.  No acute behavioral concerns. 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     -pt would benefit from substance use treatment - social worker can offer available community options  -Consider reaching out to neurology for additional workup.  Agitation: Start Zyprexa Zydis 2.5 mg PO at HS - will monitor for decreased agitation   Zyprexa 2.5 mg IM every six hours PRN for agitation protocol.  EKG 07/31/23 was 494 -Continue hydroxyzine 25 mg p.o. 3 times daily as needed for anxiety /sleep Per nursing hydroxyzine has worked well in the past.   Insomnia: Open blinds, encouraged patient to stay awake during the day  Labs reviewed to include HIV, Hepatitis, respiratory panel, RPR, TSH, CMP (hypomagnesia 1.5), and hypoalbuminemia (2.7).  MRI motion degraded, patient remains a restless difficult to obtain good imaging at this time.  Psychiatry consult service will continue to follow at this time, [place] to no improvement.  Currently length of stay 8  days Disposition: No evidence of imminent risk to self or others at present.   Patient does not meet criteria for psychiatric inpatient admission. Supportive therapy provided about ongoing stressors. Discussed crisis plan, support from social network, calling 911, coming to the Emergency Department, and calling Suicide Hotline.  Patient will likely need SNF placement once medically clear.  Recommend TOC consult to begin discharge planning once behaviors are controlled.    Mariel Craft, MD 08/06/2023 9:46 AM

## 2023-08-06 NOTE — Plan of Care (Signed)
  Problem: Clinical Measurements: Goal: Will remain free from infection Outcome: Progressing   Problem: Clinical Measurements: Goal: Diagnostic test results will improve Outcome: Progressing   Problem: Clinical Measurements: Goal: Respiratory complications will improve Outcome: Progressing   Problem: Clinical Measurements: Goal: Cardiovascular complication will be avoided Outcome: Progressing   

## 2023-08-06 NOTE — Plan of Care (Signed)
  Problem: Elimination: Goal: Will not experience complications related to urinary retention Outcome: Progressing   Problem: Pain Management: Goal: General experience of comfort will improve Outcome: Progressing

## 2023-08-07 DIAGNOSIS — R410 Disorientation, unspecified: Secondary | ICD-10-CM | POA: Diagnosis not present

## 2023-08-07 DIAGNOSIS — F1024 Alcohol dependence with alcohol-induced mood disorder: Secondary | ICD-10-CM | POA: Diagnosis not present

## 2023-08-07 DIAGNOSIS — F10231 Alcohol dependence with withdrawal delirium: Secondary | ICD-10-CM | POA: Diagnosis not present

## 2023-08-07 LAB — CBC
HCT: 32.8 % — ABNORMAL LOW (ref 39.0–52.0)
Hemoglobin: 10.5 g/dL — ABNORMAL LOW (ref 13.0–17.0)
MCH: 31.7 pg (ref 26.0–34.0)
MCHC: 32 g/dL (ref 30.0–36.0)
MCV: 99.1 fL (ref 80.0–100.0)
Platelets: 236 10*3/uL (ref 150–400)
RBC: 3.31 MIL/uL — ABNORMAL LOW (ref 4.22–5.81)
RDW: 16.7 % — ABNORMAL HIGH (ref 11.5–15.5)
WBC: 3.8 10*3/uL — ABNORMAL LOW (ref 4.0–10.5)
nRBC: 0 % (ref 0.0–0.2)

## 2023-08-07 LAB — MAGNESIUM: Magnesium: 1.4 mg/dL — ABNORMAL LOW (ref 1.7–2.4)

## 2023-08-07 LAB — PHOSPHORUS: Phosphorus: 4.2 mg/dL (ref 2.5–4.6)

## 2023-08-07 LAB — COMPREHENSIVE METABOLIC PANEL
ALT: 17 U/L (ref 0–44)
AST: 26 U/L (ref 15–41)
Albumin: 3 g/dL — ABNORMAL LOW (ref 3.5–5.0)
Alkaline Phosphatase: 90 U/L (ref 38–126)
Anion gap: 7 (ref 5–15)
BUN: 15 mg/dL (ref 8–23)
CO2: 25 mmol/L (ref 22–32)
Calcium: 9.2 mg/dL (ref 8.9–10.3)
Chloride: 105 mmol/L (ref 98–111)
Creatinine, Ser: 0.58 mg/dL — ABNORMAL LOW (ref 0.61–1.24)
GFR, Estimated: >60 mL/min (ref >60)
Glucose, Bld: 81 mg/dL (ref 70–99)
Potassium: 3.9 mmol/L (ref 3.5–5.1)
Sodium: 137 mmol/L (ref 135–145)
Total Bilirubin: 0.6 mg/dL (ref 0.0–1.2)
Total Protein: 6.8 g/dL (ref 6.5–8.1)

## 2023-08-07 MED ORDER — MAGNESIUM SULFATE 4 GM/100ML IV SOLN
4.0000 g | Freq: Once | INTRAVENOUS | Status: AC
  Administered 2023-08-07: 4 g via INTRAVENOUS
  Filled 2023-08-07: qty 100

## 2023-08-07 NOTE — Progress Notes (Signed)
Mobility Specialist - Progress Note  During mobility: 116 bpm HR,  Post-mobility: 91 bpm HR,   08/07/23 1310  Mobility  Activity Ambulated with assistance in room;Ambulated with assistance in hallway  Level of Assistance Contact guard assist, steadying assist  Assistive Device Front wheel walker  Distance Ambulated (ft) 200 ft  Range of Motion/Exercises Active Assistive  Activity Response Tolerated well  Mobility Referral Yes  Mobility visit 1 Mobility  Mobility Specialist Start Time (ACUTE ONLY) 1245  Mobility Specialist Stop Time (ACUTE ONLY) 1310  Mobility Specialist Time Calculation (min) (ACUTE ONLY) 25 min   Pt was found in bed and agreeable to ambulate. Pt very impulsive and distracted easily. Able to reorient to ambulation during session. Used RW for ambulation in room but adamant on HHA for hallway ambulation. At EOS returned to recliner chair and wheeled out in hallway to be supervised by RN.   Billey Chang Mobility Specialist

## 2023-08-07 NOTE — Plan of Care (Signed)
  Problem: Activity: Goal: Risk for activity intolerance will decrease Outcome: Progressing   Problem: Nutrition: Goal: Adequate nutrition will be maintained Outcome: Progressing   Problem: Elimination: Goal: Will not experience complications related to urinary retention Outcome: Progressing   Problem: Pain Management: Goal: General experience of comfort will improve Outcome: Progressing

## 2023-08-07 NOTE — Progress Notes (Signed)
PROGRESS NOTE  Anthony [name]. OZH:[id number] DOB: Jan 15, 1956 DOA: 07/28/2023 PCP: Pcp, No   LOS: 9 days   Brief Narrative / Interim history: 68 year old male with alcohol use, frequent withdrawals and delirium in the past, PAF, recent PE, not on anticoagulation medication due to homelessness and nonadherence to medical treatment, comes into the hospital from the shelter due to delirium.  Hospital course complicated by A-fib with RVR requiring a diltiazem drip.  He was also found to have a fever of 100.6 on 1/10, but afebrile since.  Cultures negative  Subjective / 24h Interval events: Is confused but more alert today and more talkative  Assesement and Plan: Principal Problem:   Delirium Active Problems:   Alcohol abuse with alcohol-induced mood disorder (HCC)   Alcohol withdrawal delirium (HCC)   Agitation  Principal problem Acute metabolic encephalopathy, delirium, underlying EtOH use -patient has having episodes like this in the past, noted several times during prior hospitalizations and thought to be in the setting of alcohol withdrawal/DTs.   -Thiamine levels normal, but has received empirical high-dose thiamine -Ammonia normal, B12 on the lower end, provide B12 injection x 1 -MRI of the brain was motion degraded but no acute findings.  EEG did not show seizures or epileptiform discharges -RPR negative -Psychiatry consulted, appreciate input. -Discussed with the [facility] with neurology, case reviewed and images reviewed.  Given that he has been having intermittent confusions with prior hospitalizations, this may suggest an underlying neurodegenerative disorder possibly in the setting of longstanding EtOH use.  This is likely to have progressed.  Does not recall our additional testing or interventions that could be done at this point but weight and give him time -He does have this eye weakness, but it is not clear to me whether its acute or chronic.  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  Most recent PE was seen December 2024.  On room air, no chest pain  Fever-unclear etiology, cultures are negative, has received 5 days of empiric antibiotics, discontinued 1/14.  He was initially on cefepime which was thought to may have contributed to #1, has been transitioned to Zosyn.  Continue to monitor off antibiotics, he is remaining afebrile  Essential hypertension-has been placed on metoprolol.  Blood pressure acceptable, heart rate controlled  PAF, with RVR -more agreeable to take metoprolol, rate controlled  Chronic systolic CHF-EF 40-45%, global hypokinesis.  Cardiology consulted, GDMT is limited by noncompliance as well as intermittent hypotension.  Medical management  Hypokalemia, hypomagnesemia-potassium normal, replace magnesium again this morning  Hyponatremia-resolved  Macrocytic anemia-monitor, no bleeding  Scheduled Meds:  enoxaparin (LOVENOX) injection  65 mg Subcutaneous Q12H   feeding supplement  237 mL Oral BID BM   folic acid  1 mg Oral Daily   metoprolol succinate  50 mg Oral Daily   multivitamin with minerals  1 tablet Oral Daily   OLANZapine zydis  2.5 mg Oral QHS   potassium chloride  40 mEq Oral Once   thiamine  100 mg Oral Daily   Or   thiamine  100 mg Intravenous Daily   Continuous Infusions:  magnesium sulfate bolus IVPB     PRN Meds:.acetaminophen **OR** acetaminophen, hydrALAZINE, hydrOXYzine, ipratropium-albuterol, metoprolol tartrate, OLANZapine, ondansetron (ZOFRAN) IV  Current Outpatient Medications  Medication Instructions   acetaminophen (TYLENOL) 500 mg, Oral, Every 8 hours PRN   apixaban (ELIQUIS) 5 MG TABS tablet Take  2 tablets daily (10 mg total) twice daily for 4 days then on Dec 27, reduce to 1 tablet (5 mg total) twice daily.   FeroSul 325 mg, Oral, Daily with breakfast   folic  acid (FOLVITE) 1 mg, Oral, Daily   magnesium oxide (MAG-OX) 400 mg, Oral, 2 times daily   metoprolol tartrate (LOPRESSOR) 25 mg, Oral, 2 times daily   mometasone-formoterol (DULERA) 200-5 MCG/ACT AERO 2 puffs, Inhalation, 2 times daily   pantoprazole (PROTONIX) 40 mg, Oral, Daily   rosuvastatin (CRESTOR) 10 mg, Oral, Daily   thiamine (VITAMIN B1) 100 mg, Oral, Daily    Diet Orders (From admission, onward)     Start     Ordered   08/04/23 1400  Diet regular Fluid consistency: Thin  Diet effective now       Question:  Fluid consistency:  Answer:  Thin   08/04/23 1359            DVT prophylaxis: SCDs Start: 07/28/23 1549   Lab Results  Component Value Date   PLT 236 08/07/2023      Code Status: Full Code  Family Communication: no family at bedside   Status is: Inpatient Remains inpatient appropriate because: severity of illness  Level of care: Progressive  Consultants:  Psychiatry   Objective: Vitals:   08/06/23 1755 08/06/23 2001 08/07/23 0602 08/07/23 0833  BP: 100/62 110/83 122/82 122/82  Pulse: 82 97 97 97  Resp: 16 18 16    Temp: 98.3 F (36.8 C) 98.2 F (36.8 C) 98.3 F (36.8 C)   TempSrc:  Oral Oral   SpO2:  100% 97%   Weight:      Height:        Intake/Output Summary (Last 24 hours) at 08/07/2023 1003 Last data filed at 08/07/2023 0500 Gross per 24 hour  Intake 240 ml  Output 1000 ml  Net -760 ml   Wt Readings from Last 3 Encounters:  07/28/23 64.7 kg  07/12/23 67.1 kg  03/06/23 64 kg    Examination:  Constitutional: NAD Eyes: lids and conjunctivae normal, no scleral icterus ENMT: mmm Neck: normal, supple Respiratory: clear to auscultation bilaterally, no wheezing, no crackles. Normal respiratory effort.  Cardiovascular: Regular rate and rhythm, no murmurs / rubs / gallops. No LE edema. Abdomen: soft, no distention, no tenderness. Bowel sounds positive.    Data Reviewed: I have independently reviewed following labs and imaging  studies   CBC Recent Labs  Lab 08/01/23 0301 08/02/23 0417 08/03/23 0412 08/04/23 0404 08/05/23 0358 08/06/23 0414 08/07/23 0500  WBC 7.4 5.9 4.0 3.3* 3.2* 3.9* 3.8*  HGB 10.0* 9.5* 10.4* 9.9* 10.7* 10.4* 10.5*  HCT 30.2* 29.1* 33.2* 31.3* 32.9* 32.2* 32.8*  PLT 211 213 243 240 269 245 236  MCV 98.1 99.3 102.8* 98.4 97.9 98.8 99.1  MCH 32.5 32.4 32.2 31.1 31.8 31.9 31.7  MCHC 33.1 32.6 31.3 31.6 32.5 32.3 32.0  RDW 17.8* 17.4* 17.2* 17.1* 17.2* 17.0* 16.7*  LYMPHSABS 0.9 1.1 1.1 1.3  --   --   --   MONOABS 0.8 0.8 0.5 0.5  --   --   --   EOSABS 0.0 0.2 0.2 0.1  --   --   --   BASOSABS 0.0 0.0 0.1 0.1  --   --   --     Recent Labs  Lab 07/31/23 1816 08/01/23 0301 08/02/23 0417 08/03/23 0412 08/04/23 0404 08/05/23 0358 08/07/23 0500  NA  --    < > 135 135  136 132* 137  K  --    < > 3.7 4.0 3.8 3.6 3.9  CL  --    < > 111 110 106 102 105  CO2  --    < > 18* 18* 20* 19* 25  GLUCOSE  --    < > 96 101* 85 76 81  BUN  --    < > 11 10 10 8 15   CREATININE  --    < > 0.36* 0.58* 0.47* 0.50* 0.58*  CALCIUM  --    < > 8.9 8.8* 9.0 8.8* 9.2  AST  --    < > 21 24 26 26 26   ALT  --    < > 12 13 14 16 17   ALKPHOS  --    < > 80 90 91 92 90  BILITOT  --    < > 1.1 0.9 0.7 0.6 0.6  ALBUMIN  --    < > 2.8* 2.7* 2.7* 2.9* 3.0*  MG  --    < > 1.7 1.5* 1.5* 1.4* 1.4*  INR 1.5*  --   --   --   --   --   --    < > = values in this interval not displayed.    ------------------------------------------------------------------------------------------------------------------ No results for input(s): "CHOL", "HDL", "LDLCALC", "TRIG", "CHOLHDL", "LDLDIRECT" in the last 72 hours.  Lab Results  Component Value Date   HGBA1C 5.2 02/06/2023   ------------------------------------------------------------------------------------------------------------------ No results for input(s): "TSH", "T4TOTAL", "T3FREE", "THYROIDAB" in the last 72 hours.  Invalid input(s): "FREET3"  Cardiac Enzymes No  results for input(s): "CKMB", "TROPONINI", "MYOGLOBIN" in the last 168 hours.  Invalid input(s): "CK" ------------------------------------------------------------------------------------------------------------------    Component Value Date/Time   BNP 754.3 (H) 07/12/2023 1801    CBG: No results for input(s): "GLUCAP" in the last 168 hours.  Recent Results (from the past 240 hours)  Culture, blood (Routine X 2) w Reflex to ID Panel     Status: None   Collection Time: 07/30/23  8:07 PM   Specimen: BLOOD RIGHT ARM  Result Value Ref Range Status   Specimen Description   Final    BLOOD RIGHT ARM AEROBIC BOTTLE ONLY Performed at [facility], [address] [place], [place] [address]    Special Requests   Final    Blood Culture results may not be optimal due to an inadequate volume of blood received in culture bottles BOTTLES DRAWN AEROBIC ONLY Performed at [facility], [address] [place], [place] [address]    Culture   Final    NO GROWTH 5 DAYS Performed at [facility], [address] [place], [place] [address]    Report Status 08/05/2023 FINAL  Final  Culture, blood (Routine X 2) w Reflex to ID Panel     Status: None   Collection Time: 07/30/23  8:07 PM   Specimen: BLOOD RIGHT ARM  Result Value Ref Range Status   Specimen Description   Final    BLOOD RIGHT ARM AEROBIC BOTTLE ONLY Performed at [facility], [address], [place], [place] [address]    Special Requests   Final    Blood Culture results may not be optimal due to an inadequate volume of blood received in culture bottles BOTTLES DRAWN AEROBIC ONLY Performed at [facility], [address], [place], [place] [address]    Culture   Final    NO GROWTH 5 DAYS Performed at  [facility], [address] [place], [place] [address]    Report Status 08/05/2023 FINAL  Final     Radiology Studies: No results  found.   Pamella Pert, MD, PhD [facility]  Between 7 am - 7 pm I am available, please contact me via Amion (for emergencies) or Securechat (non urgent messages)  Between 7 pm - 7 am I am not available, please contact night coverage MD/APP via Amion

## 2023-08-07 NOTE — Consult Note (Signed)
Face-to-Face Psychiatry Consult   Reason for Consult:  significant recurrent agitation. Chronic. Would benefit from chronic medications Referring Physician:  Dr. Nelson Chimes  Patient Identification: Anthony [name]. MRN:  [id number] Principal Diagnosis: Delirium Diagnosis:  Principal Problem:   Delirium Active Problems:   Alcohol abuse with alcohol-induced mood disorder (HCC)   Alcohol withdrawal delirium (HCC)   Agitation   Total Time spent with patient: 35 minutes  Subjective:   Mr. Anthony [name] is a 68y/o male with a psychiatric history significant for alcohol use disorder, depression and anxiety who presents to the ED with complaint of AMS.   On initial assessment per Caryn Bee, 07/30/23: The patient's current presentation is consistent with hypoactive delirium versus metabolic encephalopathy. He displays signs of disorientation, confusion, restlessness, and fluctuating mental status, which are indicative of disturbed sensorium. However, there are ongoing concerns regarding potential underlying etiologies, such as neuropsychological conditions or a thrombotic event. While the patient is well outside the alcohol detox window and his B1 levels are within normal limits, he continues to receive B1 transfusions as part of his care. His condition remains complex, and further evaluation is needed to rule out other neurological or systemic causes. The patient continues to exhibit waxing and waning dysarthria and slurred speech, which complicate communication. Additionally, there appears to be some right eye muscle weakness, which may be indicative of a "lazy eye," further suggesting possible neurological involvement. Despite these challenges, he is able to move all extremities and grip hands with bilateral mittens in place, which suggests preserved motor function to some degree.  08/07/2023: Prior to the assessment, the patient was feeding himself with a nursing student in his room to assist. His  appetite has improved over the past few days and asked for more of his muffin. The client was sitting upright in his bed smiling and engaged easily in conversation.  He is difficult to understand at times, pleasant and cooperative.  His primary RN reported his behaviors are appropriate today.  However, he will get out of bed at times and walk down the hallway, his bed alarm is in place.  He is easily redirectable.  When asked how he feels today, patient described his mood as "Rusty, rusty is when a car part doesn't work like it should, it's not great but it is rusty".  His care nurse reports that he was happy this morning because he was able to feed himself, reports not all nurses allow him to do so.  Mitts remain on when nursing staff is not in the room due to pulling at PIV.  He is tolerating the oral Zyprexa at night and PRN is available IM, not used since 1/16 at 2235.  TOC consult placed for a SNF placement.  08/06/2023:  Patient is awake upon arrival for assessment this morning.  He has his breakfast tray at bedside and it is 100% eaten.  Patient is communicative with soft voice but minimally sensible conversation.  He states, "I slept pretty good."  He is agreeable to staying awake during the day.  He is pleasant.  He continues to wear soft mitts.  He does not have a sitter at bedside. MAR documents patient receiving IM Zyprexa at 2335 last night and at night before.  Will transition to p.o. Zyprexa tonight and reevaluate.  08/05/2023: Patient sedated and minimally arousable to voice, touch and sternal tap. MAR documents that he has received Zyprexa IM the past 2 evenings. Nurses report that he has been more agitated and requires  soft mitts and a sitter at his bedside.  No concerns expressed from sitter. Reports that he was awake early this morning. Drank 240 ml and ate 50% of his breakfast.  Nurses said that he has been wide awake all day yesterday, and did not sleep well. Oncoming day nurse was  told "If he sleeps, he will sleep all day". Last EKG 07/31/2023 A-fib with Premature ventricular or aberrantly conducted complexes, nonspecific ST and T wave abnormalities, QTc 494. Ordered a new EKG to be completed today. Delirium precautions reviewed.  I have encouraged sitter to keep the patient awake and attempt to get him into a chair. No known family members have been contacted..  Nurses state that no one has been to visit.  08/04/2023 Per nursing report patient has episodes of looseness, that fluctuates with agitation and aggression at times.  We did discuss possible etiologies and contributions to current state of delirium and confusion.  Nursing is encouraged to ambulate patient and consider nonpharmacological interventions to include awake during daytime, opening the blinds, and out of bed to chair, reducing use of restraints and soft mittens.  She reports patient continues to make multiple attempts to get out of bed and difficult to redirect.  Nursing is encouraged to document suggested findings.  08/03/2023 Group  Credited to [employer] [facility] on initial assessment on 1/10./2025: Anthony A Lawlor Jr. Is a 68 year old male patient with a medical history significant for alcohol abuse, prior delirium tremens (DTs), multiple pulmonary emboli, documented homelessness, and medication noncompliance is being admitted for delirium. Unfortunately, he has had several recent ER visits since late December, presenting with complaints of feeling cold and erratic behavior, but he has left against medical advice each time. Upon initial documentation, the patient was observed ambulating, but today, during evaluation, he was found in bed with bilateral soft wrist restraints. He is disoriented, alert to himself and his location, but unaware of the time or situation. Some confabulation, disturbed sensorium, restlessness, and ongoing irritability were noted. At one point, he became defensive, stating, "I ain't  been drinking no damn alcohol," when asked about alcohol use. He was wearing a male PureWick device, which he continued to pick at, despite it being properly placed. Initially, the patient was highly disoriented, but after several hours, he became more verbal, oriented, and cooperative. However, he remains extremely weak, unsteady, and clearly unable to care for himself. His current presentation is consistent with delirium tremens, and he is at high risk for Wernicke's encephalopathy. Treatment will include high-dose IV thiamine.  HPI:  Zalyn A Salay Montez Hageman. is a 68 y.o. male. Admitted on 07/28/2023: 68 year old male with a history of alcohol abuse, hypertension, homelessness, CVA, chronic heart failure, atrial fibrillation, history of PE in July 2024 comes in with chief complaint of homelessness.   Patient was seen in the ER multiple times between 1/6-1/8.  It appears that he was sent to the [facility], likely moved to the [facility] and they had him leave because he was loud. Patient has no complaints from his side. Upon further questioning, he said he has pain all over.  He confirms he is homeless.  He is oriented to self, location but thinks it is 37.  He states that normally he will just sleep wherever he can find a place to rest, but yesterday was white flag day.  He denies any substance use. Assessment:   Given these findings, the patient's fluctuating mental state, combined with signs of neurological impairment and  possible confabulation, warrants ongoing monitoring and further investigation into the potential causes of his presentation. His care team should consider exploring neuropsychological conditions, metabolic imbalances, and possible thrombotic events as part of the differential diagnosis.  Another concerning symptom is the patient's degree of confabulation, including statements about inappropriate sexual relations with physicians, which may point to cognitive dysfunction or disorganized  thinking. In a recent interaction, the patient also made an unusual remark, saying, "You can sit with me for a while, but she would just have to steal while you are here," which further raises concerns about his cognitive processing and coherence.  Past Psychiatric History:  Dx: "depression, anxiety, alcohol [use disorder]" Denies h/o psych hosp Denies h/o suicide attempts  Risk to Self:  denies Risk to Others:  denies Prior Inpatient Therapy:  denies, chart review shows [facility] 07/2022 Prior Outpatient Therapy:  denies  Past Medical History:  Past Medical History:  Diagnosis Date   Actinic keratosis 11/27/2016   Alcohol abuse with alcohol-induced mood disorder (HCC) 07/18/2018   Alcohol use disorder, severe, dependence (HCC) 09/16/2006   05/22/2015 Argumentative, belligerent and verbally abusive to staff per his note from [place]    Alcoholism [facility])    Anxiety state 04/25/2018   Aortic atherosclerosis (HCC) 08/27/2022   Chronic low back pain 09/16/2006   Followed by NS.  Per his chart from [place], he fell off two storyhouse roof while cleaning his brother's gutters and sustained compression fracture of L3, L4 and L5 and fracture of right transverse process at L3 and nondisplaced fracture of left glenoid rim many years ago. He had multiple imaging in [place] including Lumbar MRI in 2016 which showed moderate to severe spinal canal stenos   Delirium tremens (HCC) 09/01/2017   Depression    Dry eye 11/23/2016   Foreign body in middle portion of esophagus 05/19/2022   Hiatal hernia 09/14/2016   Status Collis gastroplasty and Nissen's fundoplication on 04/29/2015 in [place]   History of delirium tremons 07/22/2020   DTs during 10/2016 08/2017. And 12/2017 admissions    History of pulmonary embolus (PE) 11/02/2016   Unprovoked 11/01/2016. Xarelto from 11/01/16 to 09/08/2017.   Hydrocele    Surgically corrected   Hypertension    Hypoalbuminemia due to protein-calorie  malnutrition (HCC) 07/22/2020   Lumbar compression fracture (HCC)    Multiple rib fractures 07/22/2019   Left rib details XR: left ribs demonstrate multiple remote rib   Pancytopenia (HCC)    Rhabdomyolysis 07/22/2020   Skin cancer    exciced 2017   Tinea versicolor 06/08/2013   Tobacco use disorder 07/22/2020   Tooth infection 04/25/2018   Trigger finger, acquired 11/18/2012   Ulnar tunnel syndrome of right wrist 11/08/2012    Past Surgical History:  Procedure Laterality Date   BIOPSY  05/21/2022   Procedure: BIOPSY;  Surgeon: Lynann Bologna, MD;  Location: [facility] ENDOSCOPY;  Service: Gastroenterology;;   CATARACT EXTRACTION     ESOPHAGOGASTRODUODENOSCOPY (EGD) WITH PROPOFOL N/A 05/21/2022   Procedure: ESOPHAGOGASTRODUODENOSCOPY (EGD) WITH PROPOFOL;  Surgeon: Lynann Bologna, MD;  [facility] END[facility]COPY;  Service: Gastroenterology;  Laterality: N/A;   FOREIGN BODY REMOVAL N/A 05/21/2022   Procedure: FOREIGN BODY REMOVAL;  Surgeon: Lynann Bologna, MD;  Location: [facility] ENDOSCOPY;  Service: Gastroenterology;  Laterality: N/A;   HIATAL HERNIA REPAIR  2016   HYDROCELE EXCISION / REPAIR  2010   SKIN CANCER EXCISION Left    Excised 2017   Family History:  Family History  Problem Relation Age of Onset   Heart  attack Father        died at 60 years   Dementia Father    Stroke Father    Cancer Sister    Colon cancer Neg Hx    Colon polyps Neg Hx    Esophageal cancer Neg Hx    Stomach cancer Neg Hx    Rectal cancer Neg Hx    Family Psychiatric  History: does not report  Social History:  Social History   Substance and Sexual Activity  Alcohol Use Yes   Alcohol/week: 43.0 standard drinks of alcohol   Types: 42 Cans of beer, 1 Standard drinks or equivalent per week     Social History   Substance and Sexual Activity  Drug Use Yes   Types: Marijuana    Social History   Socioeconomic History   Marital status: Single    Spouse name: Not on file   Number of children: Not on file    Years of education: Not on file   Highest education level: Not on file  Occupational History   Occupation: unemployed  Tobacco Use   Smoking status: Never   Smokeless tobacco: Current    Types: Snuff   Tobacco comments:    Pt states do snuff when stressed had 1 can last 3 month//PL  Vaping Use   Vaping status: Never Used  Substance and Sexual Activity   Alcohol use: Yes    Alcohol/week: 43.0 standard drinks of alcohol    Types: 42 Cans of beer, 1 Standard drinks or equivalent per week   Drug use: Yes    Types: Marijuana   Sexual activity: Yes    Birth control/protection: None  Other Topics Concern   Not on file  Social History Narrative   Lives with a friend. Has children but hasn't met them in a while. He doesn't know where they live.    Social Drivers of Corporate investment banker Strain: Not on file  Food Insecurity: Patient Unable To Answer (07/28/2023)   Hunger Vital Sign    Worried About Running Out of Food in the Last Year: Patient unable to answer    Ran Out of Food in the Last Year: Patient unable to answer  Recent Concern: Food Insecurity - Food Insecurity Present (07/08/2023)   Hunger Vital Sign    Worried About Programme researcher, broadcasting/film/video in the Last Year: Often true    Ran Out of Food in the Last Year: Often true  Transportation Needs: Patient Unable To Answer (07/28/2023)   PRAPARE - Administrator, Civil Service (Medical): Patient unable to answer    Lack of Transportation (Non-Medical): Patient unable to answer  Recent Concern: Transportation Needs - Unmet Transportation Needs (07/08/2023)   PRAPARE - Administrator, Civil Service (Medical): Yes    Lack of Transportation (Non-Medical): Yes  Physical Activity: Not on file  Stress: Not on file  Social Connections: Patient Unable To Answer (07/28/2023)   Social Connection and Isolation Panel [NHANES]    Frequency of Communication with Friends and Family: Patient unable to answer    Frequency of  Social Gatherings with Friends and Family: Patient unable to answer    Attends Religious Services: Patient unable to answer    Active Member of Clubs or Organizations: Patient unable to answer    Attends Banker Meetings: Patient unable to answer    Marital Status: Patient unable to answer   Additional Social History:    Allergies:  Allergies  Allergen Reactions   Other Itching and Other (See Comments)    Seasonal allergies- Itchy eyes, runny nose, congestion   Poison Ivy Extract Rash    Labs:  Results for orders placed or performed during the hospital encounter of 07/28/23 (from the past 48 hours)  Cortisol     Status: None   Collection Time: 08/05/23 10:48 AM  Result Value Ref Range   Cortisol, Plasma 7.0 ug/dL    Comment: (NOTE) AM    6.7 - 22.6 ug/dL PM   <16.1       ug/dL Performed at [facility], [address], [place], [place] [address]   CBC     Status: Abnormal   Collection Time: 08/06/23  4:14 AM  Result Value Ref Range   WBC 3.9 (L) 4.0 - 10.5 K/uL   RBC 3.26 (L) 4.22 - 5.81 MIL/uL   Hemoglobin 10.4 (L) 13.0 - 17.0 g/dL   HCT 54.0 (L) 98.1 - 19.1 %   MCV 98.8 80.0 - 100.0 fL   MCH 31.9 26.0 - 34.0 pg   MCHC 32.3 30.0 - 36.0 g/dL   RDW 47.8 (H) 29.5 - 62.1 %   Platelets 245 150 - 400 K/uL   nRBC 0.0 0.0 - 0.2 %    Comment: Performed at [facility], [address] [place], [place] [address]  CBC     Status: Abnormal   Collection Time: 08/07/23  5:00 AM  Result Value Ref Range   WBC 3.8 (L) 4.0 - 10.5 K/uL   RBC 3.31 (L) 4.22 - 5.81 MIL/uL   Hemoglobin 10.5 (L) 13.0 - 17.0 g/dL   HCT 78.4 (L) 69.6 - 29.5 %   MCV 99.1 80.0 - 100.0 fL   MCH 31.7 26.0 - 34.0 pg   MCHC 32.0 30.0 - 36.0 g/dL   RDW 28.4 (H) 13.2 - 44.0 %   Platelets 236 150 - 400 K/uL   nRBC 0.0 0.0 - 0.2 %    Comment: Performed at [facility], [address], [place], [place] [address]  Comprehensive metabolic panel     Status:  Abnormal   Collection Time: 08/07/23  5:00 AM  Result Value Ref Range   Sodium 137 135 - 145 mmol/L   Potassium 3.9 3.5 - 5.1 mmol/L   Chloride 105 98 - 111 mmol/L   CO2 25 22 - 32 mmol/L   Glucose, Bld 81 70 - 99 mg/dL    Comment: Glucose reference range applies only to samples taken after fasting for at least 8 hours.   BUN 15 8 - 23 mg/dL   Creatinine, Ser 5.36 (L) 0.61 - 1.24 mg/dL   Calcium 9.2 8.9 - 64.4 mg/dL   Total Protein 6.8 6.5 - 8.1 g/dL   Albumin 3.0 (L) 3.5 - 5.0 g/dL   AST 26 15 - 41 U/L   ALT 17 0 - 44 U/L   Alkaline Phosphatase 90 38 - 126 U/L   Total Bilirubin 0.6 0.0 - 1.2 mg/dL   GFR, Estimated >03 >47 mL/min    Comment: (NOTE) Calculated using the CKD-EPI Creatinine Equation (2021)    Anion gap 7 5 - 15    Comment: Performed at [facility], [address] [place], [place] [address]  Magnesium     Status: Abnormal   Collection Time: 08/07/23  5:00 AM  Result Value Ref Range   Magnesium 1.4 (L) 1.7 - 2.4 mg/dL  Comment: Performed at [facility], [address], [place], [place] [address]  Phosphorus     Status: None   Collection Time: 08/07/23  5:00 AM  Result Value Ref Range   Phosphorus 4.2 2.5 - 4.6 mg/dL    Comment: Performed at [facility], [address], [place], [place] [address]    Current Facility-Administered Medications  Medication Dose Route Frequency Provider Last Rate Last Admin   acetaminophen (TYLENOL) tablet 650 mg  650 mg Oral Q6H PRN Kirby Crigler, Mir M, MD   650 mg at 08/06/23 1424   Or   acetaminophen (TYLENOL) suppository 650 mg  650 mg Rectal Q6H PRN Kirby Crigler, Mir M, MD       enoxaparin (LOVENOX) injection 65 mg  65 mg Subcutaneous Q12H Marguerita Merles Mantee, DO   65 mg at 08/06/23 2110   feeding supplement (ENSURE ENLIVE / ENSURE PLUS) liquid 237 mL  237 mL Oral BID BM Kirby Crigler, Mir M, MD   237 mL at 08/06/23 1426   folic acid (FOLVITE) tablet 1 mg  1 mg Oral Daily  Sheikh, Omair [place], DO   1 mg at 08/06/23 1425   hydrALAZINE (APRESOLINE) injection 10 mg  10 mg Intravenous Q4H PRN Amin, Ankit C, MD       hydrOXYzine (ATARAX) tablet 25 mg  25 mg Oral TID PRN Maryagnes Amos, FNP   25 mg at 08/06/23 1425   ipratropium-albuterol (DUONEB) 0.5-2.5 (3) MG/3ML nebulizer solution 3 mL  3 mL Nebulization Q4H PRN Amin, Ankit C, MD       magnesium sulfate IVPB 4 g 100 mL  4 g Intravenous Once Leatha Gilding, MD       metoprolol succinate (TOPROL-XL) 24 hr tablet 50 mg  50 mg Oral Daily Leatha Gilding, MD   50 mg at 08/06/23 1425   metoprolol tartrate (LOPRESSOR) injection 5 mg  5 mg Intravenous Q4H PRN Amin, Ankit C, MD   5 mg at 08/03/23 0730   multivitamin with minerals tablet 1 tablet  1 tablet Oral Daily Marguerita Merles Marshall, DO   1 tablet at 08/06/23 1425   OLANZapine (ZYPREXA) injection 2.5 mg  2.5 mg Intramuscular Q6H PRN Charm Rings, NP   2.5 mg at 08/05/23 2335   OLANZapine zydis (ZYPREXA) disintegrating tablet 2.5 mg  2.5 mg Oral QHS Mariel Craft, MD   2.5 mg at 08/06/23 2158   ondansetron (ZOFRAN) injection 4 mg  4 mg Intravenous Q6H PRN Amin, Ankit C, MD       potassium chloride SA (KLOR-CON M) CR tablet 40 mEq  40 mEq Oral Once Sheikh, Omair Latif, DO       thiamine (VITAMIN B1) tablet 100 mg  100 mg Oral Daily Sheikh, Omair Latif, DO       Or   thiamine (VITAMIN B1) injection 100 mg  100 mg Intravenous Daily Merlene Laughter, DO        Musculoskeletal: Strength & Muscle Tone: restless in his bed  Gait & Station: did not witness Patient leans: None  Psychiatric Specialty Exam: Physical Exam Vitals reviewed.  Constitutional:      General: He is not in acute distress.    Appearance: He is not toxic-appearing.  HENT:     Head: Normocephalic.     Nose: Nose normal.  Eyes:     Comments: Drooping of the right eye noted on 08/04/2023.   Pulmonary:     Effort: Pulmonary effort is  normal. No respiratory distress.   Musculoskeletal:     Cervical back: Normal range of motion.  Skin:    General: Skin is warm and dry.  Neurological:     Mental Status: He is alert.     Review of Systems  Psychiatric/Behavioral:  Positive for memory loss.   All other systems reviewed and are negative.   Blood pressure 122/82, pulse 97, temperature 98.3 F (36.8 C), temperature source Oral, resp. rate 16, height 5\' 8"  (1.727 m), weight 64.7 kg, SpO2 97%.Body mass index is 21.69 kg/m.  General Appearance: Fairly Groomed  Eye Contact:  Good  Speech:  talking with regular rate, difficult to understand  Volume:  WDL  Mood:  denies any distress  Affect:  Congruent, smiling appropriately  Thought Process:  less disorganized  Orientation:  alert and oriented to self  Thought Content:  Minimally sensible, some periods of comprehension of his thought process, his speech is difficult to understand  Suicidal Thoughts:  No  Homicidal Thoughts:  No  Memory:  Immediate;   Poor Recent;   Poor Remote;   Poor  Judgement:  Impaired  Insight:  Lacking  Psychomotor Activity:  WDL  Concentration:  Concentration: Fair and Attention Span: Fair  Recall:  Poor  Fund of Knowledge:  Poor  Language:  Fair  Akathisia:  Negative  Handed:  Right  AIMS (if indicated):     Assets:  Physical Health Resilience  ADL's:  Intact  Cognition:  Impaired,  Mild  Sleep:  Fair, per nurse     Physical Exam: Physical Exam Vitals reviewed.  Constitutional:      General: He is not in acute distress.    Appearance: He is not toxic-appearing.  HENT:     Head: Normocephalic.     Nose: Nose normal.  Eyes:     Comments: Drooping of the right eye noted on 08/04/2023.   Pulmonary:     Effort: Pulmonary effort is normal. No respiratory distress.  Musculoskeletal:     Cervical back: Normal range of motion.  Skin:    General: Skin is warm and dry.  Neurological:     Mental Status: He is alert.    Review of Systems  Psychiatric/Behavioral:   Positive for memory loss.   All other systems reviewed and are negative.  Blood pressure 122/82, pulse 97, temperature 98.3 F (36.8 C), temperature source Oral, resp. rate 16, height 5\' 8"  (1.727 m), weight 64.7 kg, SpO2 97%. Body mass index is 21.69 kg/m.  Treatment Plan Summary: Delirium, resolved Alcohol use disorder, severe   Plan: -CIWA and ativan detox protocol completed -Continue with PO or IV thiamine 100 mg daily   No medication changes.  Likely will need a TOC consult for placement.  No acute behavioral concerns. 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     -pt would benefit from substance use treatment - social worker can offer available community options  -Consider reaching out to neurology for additional workup.  Agitation: Continue Zyprexa Zydis 2.5 mg PO at HS   Zyprexa 2.5 mg IM every six hours PRN for agitation protocol.  EKG 07/31/23 was 494 -Continue hydroxyzine 25 mg p.o. 3 times daily as needed for anxiety /sleep Per nursing hydroxyzine has worked well in the past.   Insomnia: Open blinds, encouraged patient to stay awake during the day  Labs reviewed to include HIV, Hepatitis, respiratory panel, RPR, TSH, CMP (hypomagnesia 1.4), and hypoalbuminemia (3.0).    Disposition: No evidence of imminent risk to  self or others at present.   Patient does not meet criteria for psychiatric inpatient admission. Supportive therapy provided about ongoing stressors. Discussed crisis plan, support from social network, calling 911, coming to the Emergency Department, and calling Suicide Hotline.  TOC consult to begin discharge planning to a SNF.   Nanine Means, NP 08/07/2023 7:15 AM

## 2023-08-08 DIAGNOSIS — R410 Disorientation, unspecified: Secondary | ICD-10-CM | POA: Diagnosis not present

## 2023-08-08 MED ORDER — ALPRAZOLAM 0.25 MG PO TABS
0.2500 mg | ORAL_TABLET | Freq: Two times a day (BID) | ORAL | Status: AC
  Administered 2023-08-08 – 2023-08-09 (×3): 0.25 mg via ORAL
  Filled 2023-08-08 (×3): qty 1

## 2023-08-08 NOTE — Progress Notes (Signed)
PROGRESS NOTE  Anthony A Pentecost Jr. ZOX:[id number] DOB: 09-Mar-1956 DOA: 07/28/2023 PCP: Pcp, No   LOS: 10 days   Brief Narrative / Interim history: 68 year old male with alcohol use, frequent withdrawals and delirium in the past, PAF, recent PE, not on anticoagulation medication due to homelessness and nonadherence to medical treatment, comes into the hospital from the shelter due to delirium.  Hospital course complicated by A-fib with RVR requiring a diltiazem drip.  He was also found to have a fever of 100.6 on 1/10, but afebrile since.  Cultures negative  Subjective / 24h Interval events: Alert, however remains confused  Assesement and Plan: Principal Problem:   Delirium Active Problems:   Alcohol abuse with alcohol-induced mood disorder (HCC)   Alcohol withdrawal delirium (HCC)   Agitation  Principal problem Acute metabolic encephalopathy, delirium, underlying EtOH use -patient has having episodes like this in the past, noted several times during prior hospitalizations and thought to be in the setting of alcohol withdrawal/DTs.   -Thiamine levels normal, but has received empirical high-dose thiamine -Ammonia normal, B12 on the lower end, provide B12 injection x 1 -MRI of the brain was motion degraded but no acute findings.  EEG did not show seizures or epileptiform discharges -RPR negative -Psychiatry consulted, appreciate input. -Discussed with the [facility] with neurology, case reviewed and images reviewed.  Given that he has been having intermittent confusions with prior hospitalizations, this may suggest an underlying neurodegenerative disorder possibly in the setting of longstanding EtOH use.  This is likely to have progressed.  Does not recall our additional testing or interventions that could be done at this point but weight and give him time -He does have this eye weakness, but it is not clear to me whether its acute or chronic.  Given lack of clarity when this started, repeat  brain MRI pending, however MRI machines are down at [facility].  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  Most recent PE was seen December 2024.  Remains hemodynamically stable  Fever-unclear etiology, cultures are negative, has received 5 days of empiric antibiotics, discontinued 1/14.  He was initially on cefepime which was thought to may have contributed to #1, has been transitioned to Zosyn.  Continue to monitor off antibiotics, he is remaining afebrile  Essential hypertension-has been placed on metoprolol.  Blood pressure acceptable, heart rate controlled.  PAF, with RVR -more agreeable to take metoprolol, rate controlled  Chronic systolic CHF-EF 40-45%, global hypokinesis.  Cardiology consulted, GDMT is limited by noncompliance as well as intermittent hypotension.  Continue medical management  Hypokalemia, hypomagnesemia-potassium normal, replace magnesium again this morning  Hyponatremia-resolved  Macrocytic anemia-monitor, no bleeding  Scheduled Meds:  enoxaparin (LOVENOX) injection  65 mg Subcutaneous Q12H   feeding supplement  237 mL Oral BID BM   folic acid  1 mg Oral Daily   metoprolol succinate  50 mg Oral Daily   multivitamin with minerals  1 tablet Oral Daily   OLANZapine zydis  2.5 mg Oral QHS   thiamine  100 mg Oral Daily   Or   thiamine  100 mg Intravenous Daily   Continuous Infusions:   PRN Meds:.acetaminophen **OR** acetaminophen, hydrALAZINE, hydrOXYzine, ipratropium-albuterol, metoprolol tartrate, OLANZapine, ondansetron (ZOFRAN) IV  Current Outpatient Medications  Medication Instructions   acetaminophen (TYLENOL) 500 mg, Oral, Every 8 hours PRN   apixaban (ELIQUIS) 5 MG TABS tablet Take 2 tablets daily (10 mg total) twice daily for 4  days then on Dec 27, reduce to 1 tablet (5 mg total) twice daily.   FeroSul 325 mg, Oral, Daily with breakfast    folic acid (FOLVITE) 1 mg, Oral, Daily   magnesium oxide (MAG-OX) 400 mg, Oral, 2 times daily   metoprolol tartrate (LOPRESSOR) 25 mg, Oral, 2 times daily   mometasone-formoterol (DULERA) 200-5 MCG/ACT AERO 2 puffs, Inhalation, 2 times daily   pantoprazole (PROTONIX) 40 mg, Oral, Daily   rosuvastatin (CRESTOR) 10 mg, Oral, Daily   thiamine (VITAMIN B1) 100 mg, Oral, Daily    Diet Orders (From admission, onward)     Start     Ordered   08/04/23 1400  Diet regular Fluid consistency: Thin  Diet effective now       Question:  Fluid consistency:  Answer:  Thin   08/04/23 1359            DVT prophylaxis: SCDs Start: 07/28/23 1549   Lab Results  Component Value Date   PLT 236 08/07/2023      Code Status: Full Code  Family Communication: no family at bedside   Status is: Inpatient Remains inpatient appropriate because: severity of illness  Level of care: Progressive  Consultants:  Psychiatry   Objective: Vitals:   08/07/23 1218 08/07/23 2229 08/08/23 0348 08/08/23 0920  BP: 100/72 92/77 100/78 100/78  Pulse: 92 92 96 96  Resp: 16 16 20    Temp: (!) 97.5 F (36.4 C) 98 F (36.7 C) 97.9 F (36.6 C)   TempSrc: Oral Oral Oral   SpO2: 99% 98% 98%   Weight:      Height:        Intake/Output Summary (Last 24 hours) at 08/08/2023 0958 Last data filed at 08/07/2023 1242 Gross per 24 hour  Intake 100 ml  Output --  Net 100 ml   Wt Readings from Last 3 Encounters:  07/28/23 64.7 kg  07/12/23 67.1 kg  03/06/23 64 kg    Examination:  Constitutional: NAD Eyes: lids and conjunctivae normal, no scleral icterus ENMT: mmm Neck: normal, supple Respiratory: clear to auscultation bilaterally, no wheezing, no crackles. Normal respiratory effort.  Cardiovascular: Regular rate and rhythm, no murmurs / rubs / gallops. No LE edema. Abdomen: soft, no distention, no tenderness. Bowel sounds positive.   Data Reviewed: I have independently reviewed following labs and imaging  studies   CBC Recent Labs  Lab 08/02/23 0417 08/03/23 0412 08/04/23 0404 08/05/23 0358 08/06/23 0414 08/07/23 0500  WBC 5.9 4.0 3.3* 3.2* 3.9* 3.8*  HGB 9.5* 10.4* 9.9* 10.7* 10.4* 10.5*  HCT 29.1* 33.2* 31.3* 32.9* 32.2* 32.8*  PLT 213 243 240 269 245 236  MCV 99.3 102.8* 98.4 97.9 98.8 99.1  MCH 32.4 32.2 31.1 31.8 31.9 31.7  MCHC 32.6 31.3 31.6 32.5 32.3 32.0  RDW 17.4* 17.2* 17.1* 17.2* 17.0* 16.7*  LYMPHSABS 1.1 1.1 1.3  --   --   --   MONOABS 0.8 0.5 0.5  --   --   --   EOSABS 0.2 0.2 0.1  --   --   --   BASOSABS 0.0 0.1 0.1  --   --   --     Recent Labs  Lab 08/02/23 0417 08/03/23 0412 08/04/23 0404 08/05/23 0358 08/07/23 0500  NA 135 135 136 132* 137  K 3.7 4.0 3.8 3.6 3.9  CL 111 110 106 102 105  CO2 18* 18* 20* 19* 25  GLUCOSE 96 101* 85 76 81  BUN 11 10 10  8 15  CREATININE 0.36* 0.58* 0.47* 0.50* 0.58*  CALCIUM 8.9 8.8* 9.0 8.8* 9.2  AST 21 24 26 26 26   ALT 12 13 14 16 17   ALKPHOS 80 90 91 92 90  BILITOT 1.1 0.9 0.7 0.6 0.6  ALBUMIN 2.8* 2.7* 2.7* 2.9* 3.0*  MG 1.7 1.5* 1.5* 1.4* 1.4*    ------------------------------------------------------------------------------------------------------------------ No results for input(s): "CHOL", "HDL", "LDLCALC", "TRIG", "CHOLHDL", "LDLDIRECT" in the last 72 hours.  Lab Results  Component Value Date   HGBA1C 5.2 02/06/2023   ------------------------------------------------------------------------------------------------------------------ No results for input(s): "TSH", "T4TOTAL", "T3FREE", "THYROIDAB" in the last 72 hours.  Invalid input(s): "FREET3"  Cardiac Enzymes No results for input(s): "CKMB", "TROPONINI", "MYOGLOBIN" in the last 168 hours.  Invalid input(s): "CK" ------------------------------------------------------------------------------------------------------------------    Component Value Date/Time   BNP 754.3 (H) 07/12/2023 1801    CBG: No results for input(s): "GLUCAP" in the last  168 hours.  Recent Results (from the past 240 hours)  Culture, blood (Routine X 2) w Reflex to ID Panel     Status: None   Collection Time: 07/30/23  8:07 PM   Specimen: BLOOD RIGHT ARM  Result Value Ref Range Status   Specimen Description   Final    BLOOD RIGHT ARM AEROBIC BOTTLE ONLY Performed at [facility], [address] [place], [place] [address]    Special Requests   Final    Blood Culture results may not be optimal due to an inadequate volume of blood received in culture bottles BOTTLES DRAWN AEROBIC ONLY Performed at [facility], [address] [place], [place] [address]    Culture   Final    NO GROWTH 5 DAYS Performed at [facility], [name], [place], [place] [address]    Report Status 08/05/2023 FINAL  Final  Culture, blood (Routine X 2) w Reflex to ID Panel     Status: None   Collection Time: 07/30/23  8:07 PM   Specimen: BLOOD RIGHT ARM  Result Value Ref Range Status   Specimen Description   Final    BLOOD RIGHT ARM AEROBIC BOTTLE ONLY Performed at [facility], [address] [place], [place] [address]    Special Requests   Final    Blood Culture results may not be optimal due to an inadequate volume of blood received in culture bottles BOTTLES DRAWN AEROBIC ONLY Performed at [facility], [address], [place], [place] [address]    Culture   Final    NO GROWTH 5 DAYS Performed at [facility], [address], [place], [place] [address]    Report Status 08/05/2023 FINAL  Final     Radiology Studies: No results found.   Pamella Pert, MD, PhD [facility]  Between 7 am - 7 pm I am available, please contact me via Amion (for emergencies) or Securechat (non urgent messages)  Between 7 pm - 7 am I am not available, please contact night coverage MD/APP via Amion

## 2023-08-08 NOTE — Progress Notes (Signed)
Mobility Specialist - Progress Note  During mobility: 128 bpm HR Post-mobility:  92 bpm HR,    08/08/23 1048  Mobility  Activity Ambulated with assistance in hallway  Level of Assistance Contact guard assist, steadying assist  Assistive Device Other (Comment) (HHA)  Distance Ambulated (ft) 350 ft  Range of Motion/Exercises Active  Activity Response Tolerated well  Mobility Referral Yes  Mobility visit 1 Mobility  Mobility Specialist Start Time (ACUTE ONLY) 1030  Mobility Specialist Stop Time (ACUTE ONLY) 1048  Mobility Specialist Time Calculation (min) (ACUTE ONLY) 18 min   Pt was found on recliner chair and agreeable to ambulate. Had x1 cross step due to getting distract, able to reorient. At EOS returned to recliner chair with sitter in room.   Billey Chang Mobility Specialist

## 2023-08-08 NOTE — Progress Notes (Signed)
During the night, pt became agitated, restless, keep get off the bed and removed tubing and throw tele box. Pt is hard to redirect at that time. PRN Zyprexa given at beginning of the shift but pt still remain agitated. On call notified, obtained soft restrain order.

## 2023-08-09 ENCOUNTER — Inpatient Hospital Stay (HOSPITAL_COMMUNITY): Payer: 59

## 2023-08-09 DIAGNOSIS — F1014 Alcohol abuse with alcohol-induced mood disorder: Secondary | ICD-10-CM | POA: Diagnosis not present

## 2023-08-09 DIAGNOSIS — I4819 Other persistent atrial fibrillation: Secondary | ICD-10-CM | POA: Diagnosis not present

## 2023-08-09 DIAGNOSIS — R410 Disorientation, unspecified: Secondary | ICD-10-CM | POA: Diagnosis not present

## 2023-08-09 LAB — BASIC METABOLIC PANEL
Anion gap: 7 (ref 5–15)
BUN: 28 mg/dL — ABNORMAL HIGH (ref 8–23)
CO2: 23 mmol/L (ref 22–32)
Calcium: 9.3 mg/dL (ref 8.9–10.3)
Chloride: 106 mmol/L (ref 98–111)
Creatinine, Ser: 0.66 mg/dL (ref 0.61–1.24)
GFR, Estimated: >60 mL/min (ref >60)
Glucose, Bld: 81 mg/dL (ref 70–99)
Potassium: 3.9 mmol/L (ref 3.5–5.1)
Sodium: 136 mmol/L (ref 135–145)

## 2023-08-09 LAB — CBC
HCT: 34.5 % — ABNORMAL LOW (ref 39.0–52.0)
Hemoglobin: 10.7 g/dL — ABNORMAL LOW (ref 13.0–17.0)
MCH: 31.6 pg (ref 26.0–34.0)
MCHC: 31 g/dL (ref 30.0–36.0)
MCV: 101.8 fL — ABNORMAL HIGH (ref 80.0–100.0)
Platelets: 263 10*3/uL (ref 150–400)
RBC: 3.39 MIL/uL — ABNORMAL LOW (ref 4.22–5.81)
RDW: 17 % — ABNORMAL HIGH (ref 11.5–15.5)
WBC: 3.2 10*3/uL — ABNORMAL LOW (ref 4.0–10.5)
nRBC: 0 % (ref 0.0–0.2)

## 2023-08-09 LAB — MAGNESIUM: Magnesium: 1.5 mg/dL — ABNORMAL LOW (ref 1.7–2.4)

## 2023-08-09 MED ORDER — MAGNESIUM SULFATE 4 GM/100ML IV SOLN
4.0000 g | Freq: Once | INTRAVENOUS | Status: AC
  Administered 2023-08-09: 4 g via INTRAVENOUS
  Filled 2023-08-09: qty 100

## 2023-08-09 NOTE — Progress Notes (Signed)
Physical Therapy Treatment Patient Details Name: Anthony [name] Zelada Jr. MRN: [id number] DOB: 02-05-1956 Today's Date: 08/09/2023   History of Present Illness Pt is 68 yo male admitted on 07/28/23 from homeless shelter due to delirium. Pt dx with acute metabolic encephalopathy, delirium, underlying EtOH use, fever unclear etiology, hypokalemia, and hyponatremia.  Pt with hx of recurrent PE, frequent withdrawals and delirium, PAF    PT Comments  Pt very lethargic today that limited progression.  Noted pt had been demonstrating improvement and ambulated 350' yesterday with mobility specialist.  RN also reports pt typically getting up and moving around in room.  Today, pt very lethargic and difficult to arouse.  RN reports had received scheduled xanax prior and is going for MRI today.  Today, pt needed Anthony [name] encouragement and mod [name].  He was only able to take [name] couple steps with mod [name] and poor safety awareness.  Will continue to benefit from PT.  Noting in [facility] note that pt not SNF candidate and will need to return to shelter.  Needs improved mobility prior to return to shelter.    If plan is discharge home, recommend the following: Assistance with cooking/housework;Assist for transportation;Help with stairs or ramp for entrance;Direct supervision/assist for medications management;Direct supervision/assist for financial management;Supervision due to cognitive status;[name] lot of help with walking and/or transfers;[name] lot of help with bathing/dressing/bathroom   Can travel by private vehicle     No  Equipment Recommendations  Rolling walker (2 wheels)    Recommendations for Other Services       Precautions / Restrictions Precautions Precautions: Fall     Mobility  Bed Mobility               General bed mobility comments: in recliner    Transfers Overall transfer level: Needs assistance Equipment used: Rolling walker (2 wheels) Transfers: Sit to/from Stand Sit to Stand: Mod assist            General transfer comment: Pt slouched down in recliner - Anthony [name] x 2 with RN to scoot pt up to sitting position before putting legs down.  Pt then mod [name] to stand with increase dtime and cues    Ambulation/Gait Ambulation/Gait assistance: Mod assist Gait Distance (Feet): 2 Feet Assistive device: Rolling walker (2 wheels) Gait Pattern/deviations: Step-to pattern, Decreased stride length, Shuffle Gait velocity: decreased     General Gait Details: Pt only taking [name] couple shuffle steps at chair. He keeps eyes closed and requiring assist for balance and RW management.  Pt frequently trying to sit and needs Anthony [name] cues to back to chair.   Stairs             Wheelchair Mobility     Tilt Bed    Modified Rankin (Stroke Patients Only)       Balance Overall balance assessment: Needs assistance Sitting-balance support: Feet supported Sitting balance-Leahy Scale: Fair     Standing balance support: Bilateral upper extremity supported Standing balance-Leahy Scale: Poor Standing balance comment: RW and min/mod [name]                            Cognition Arousal: Lethargic Behavior During Therapy: Flat affect Overall Cognitive Status: Difficult to assess                                 General Comments: Pt extremely lethargic - barely opening eyes,  mumbling/difficult to understand, following minimal commands.  RN present and reports he was alert earlier and ate, had xanax around 1135.  Pt to go for MRI today.        Exercises      General Comments        Pertinent Vitals/Pain Pain Assessment Pain Assessment: No/denies pain    Home Living                          Prior Function            PT Goals (current goals can now be found in the care plan section) Progress towards PT goals: Not progressing toward goals - comment (limited by lethargy)    Frequency           PT Plan      Co-evaluation              AM-PAC PT "6  Clicks" Mobility   Outcome Measure  Help needed turning from your back to your side while in [name] flat bed without using bedrails?: [name] Lot Help needed moving from lying on your back to sitting on the side of [name] flat bed without using bedrails?: [name] Lot Help needed moving to and from [name] bed to [name] chair (including [name] wheelchair)?: [name] Lot Help needed standing up from [name] chair using your arms (e.g., wheelchair or bedside chair)?: [name] Lot Help needed to walk in hospital room?: Total Help needed climbing 3-5 steps with [name] railing? : Total 6 Click Score: 10    End of Session Equipment Utilized During Treatment: Gait belt Activity Tolerance: Patient limited by lethargy Patient left: with chair alarm set;in chair;with call bell/phone within reach Nurse Communication: Mobility status PT Visit Diagnosis: Other abnormalities of gait and mobility (R26.89);Unsteadiness on feet (R26.81)     Time: 5409-8119 PT Time Calculation (min) (ACUTE ONLY): 15 min  Charges:    $Therapeutic Activity: 8-22 mins PT General Charges $$ ACUTE PT VISIT: 1 Visit                     Anise Salvo, PT Acute Rehab [facility] [phone]    Rayetta Humphrey 08/09/2023, 3:10 PM

## 2023-08-09 NOTE — Progress Notes (Signed)
PROGRESS NOTE  Anthony A Fang Jr. GMW:[id number] DOB: 1955/11/18 DOA: 07/28/2023 PCP: Pcp, No   LOS: 11 days   Brief Narrative / Interim history: 68 year old male with alcohol use, frequent withdrawals and delirium in the past, PAF, recent PE, not on anticoagulation medication due to homelessness and nonadherence to medical treatment, comes into the hospital from the shelter due to delirium.  Hospital course complicated by A-fib with RVR requiring a diltiazem drip.  He was also found to have a fever of 100.6 on 1/10, but afebrile since.  Cultures negative  Subjective / 24h Interval events: No complaints, alert, confused  Assesement and Plan: Principal Problem:   Delirium Active Problems:   Alcohol abuse with alcohol-induced mood disorder (HCC)   Alcohol withdrawal delirium (HCC)   Agitation  Principal problem Acute metabolic encephalopathy, delirium, underlying EtOH use -patient has having episodes like this in the past, noted several times during prior hospitalizations and thought to be in the setting of alcohol withdrawal/DTs.   -Thiamine levels normal, but has received empirical high-dose thiamine -Ammonia normal, B12 on the lower end, provide B12 injection x 1 -MRI of the brain was motion degraded but no acute findings.  EEG did not show seizures or epileptiform discharges -RPR negative -Psychiatry consulted, appreciate input. -Discussed with the [facility] with neurology, case reviewed and images reviewed.  Given that he has been having intermittent confusions with prior hospitalizations, this may suggest an underlying neurodegenerative disorder possibly in the setting of longstanding EtOH use.  This is likely to have progressed.  Does not recall our additional testing or interventions that could be done at this point but weight and give him time -He does have this eye weakness, but it is not clear to me whether its acute or chronic.  Given lack of clarity when this started, repeat  brain MRI pending, however MRI machines are down at [facility].  Awaiting MRI today  Active problems Recurrent pulmonary embolism-currently on Lovenox as he is refusing oral medications intermittently.  Most recent PE was seen December 2024.  Remains hemodynamically stable  Fever-unclear etiology, cultures are negative, has received 5 days of empiric antibiotics, discontinued 1/14.  He was initially on cefepime which was thought to may have contributed to #1, has been transitioned to Zosyn.  Continue to monitor off antibiotics, he is remaining afebrile  Essential hypertension-has been placed on metoprolol.  Blood pressure acceptable, heart rate controlled.  PAF, with RVR -more agreeable to take metoprolol, rate controlled  Chronic systolic CHF-EF 40-45%, global hypokinesis.  Cardiology consulted, GDMT is limited by noncompliance as well as intermittent hypotension.  Continue medical management  Hypokalemia, hypomagnesemia-potassium normal, replace magnesium again this morning  Hyponatremia-resolved  Macrocytic anemia-monitor, no bleeding  Scheduled Meds:  ALPRAZolam  0.25 mg Oral BID   enoxaparin (LOVENOX) injection  65 mg Subcutaneous Q12H   feeding supplement  237 mL Oral BID BM   folic acid  1 mg Oral Daily   metoprolol succinate  50 mg Oral Daily   multivitamin with minerals  1 tablet Oral Daily   OLANZapine zydis  2.5 mg Oral QHS   thiamine  100 mg Oral Daily   Or   thiamine  100 mg Intravenous Daily   Continuous Infusions:   PRN Meds:.acetaminophen **OR** acetaminophen, hydrALAZINE, hydrOXYzine, ipratropium-albuterol, metoprolol tartrate, OLANZapine, ondansetron (ZOFRAN) IV  Current Outpatient Medications  Medication Instructions   acetaminophen (TYLENOL) 500 mg, Oral, Every 8 hours PRN   apixaban (ELIQUIS) 5 MG TABS tablet Take 2 tablets daily (  10 mg total) twice daily for 4 days then on Dec 27, reduce to 1 tablet (5 mg total) twice daily.   FeroSul 325 mg, Oral, Daily  with breakfast   folic acid (FOLVITE) 1 mg, Oral, Daily   magnesium oxide (MAG-OX) 400 mg, Oral, 2 times daily   metoprolol tartrate (LOPRESSOR) 25 mg, Oral, 2 times daily   mometasone-formoterol (DULERA) 200-5 MCG/ACT AERO 2 puffs, Inhalation, 2 times daily   pantoprazole (PROTONIX) 40 mg, Oral, Daily   rosuvastatin (CRESTOR) 10 mg, Oral, Daily   thiamine (VITAMIN B1) 100 mg, Oral, Daily    Diet Orders (From admission, onward)     Start     Ordered   08/04/23 1400  Diet regular Fluid consistency: Thin  Diet effective now       Question:  Fluid consistency:  Answer:  Thin   08/04/23 1359            DVT prophylaxis: SCDs Start: 07/28/23 1549   Lab Results  Component Value Date   PLT 263 08/09/2023      Code Status: Full Code  Family Communication: no family at bedside   Status is: Inpatient Remains inpatient appropriate because: severity of illness  Level of care: Progressive  Consultants:  Psychiatry   Objective: Vitals:   08/08/23 0920 08/08/23 1246 08/08/23 2036 08/09/23 0652  BP: 100/78 108/80 108/74 98/67  Pulse: 96 93 75 76  Resp:  17 19 20   Temp:  98.6 F (37 C) 98.1 F (36.7 C) 98 F (36.7 C)  TempSrc:  Oral Oral Oral  SpO2:  95%    Weight:      Height:       No intake or output data in the 24 hours ending 08/09/23 1057  Wt Readings from Last 3 Encounters:  07/28/23 64.7 kg  07/12/23 67.1 kg  03/06/23 64 kg    Examination:  Constitutional: NAD Eyes: lids and conjunctivae normal, no scleral icterus ENMT: mmm Neck: normal, supple Respiratory: clear to auscultation bilaterally, no wheezing, no crackles. Normal respiratory effort.  Cardiovascular: Regular rate and rhythm, no murmurs / rubs / gallops. No LE edema. Abdomen: soft, no distention, no tenderness. Bowel sounds positive.   Data Reviewed: I have independently reviewed following labs and imaging studies   CBC Recent Labs  Lab 08/03/23 0412 08/04/23 0404 08/05/23 0358  08/06/23 0414 08/07/23 0500 08/09/23 0502  WBC 4.0 3.3* 3.2* 3.9* 3.8* 3.2*  HGB 10.4* 9.9* 10.7* 10.4* 10.5* 10.7*  HCT 33.2* 31.3* 32.9* 32.2* 32.8* 34.5*  PLT 243 240 269 245 236 263  MCV 102.8* 98.4 97.9 98.8 99.1 101.8*  MCH 32.2 31.1 31.8 31.9 31.7 31.6  MCHC 31.3 31.6 32.5 32.3 32.0 31.0  RDW 17.2* 17.1* 17.2* 17.0* 16.7* 17.0*  LYMPHSABS 1.1 1.3  --   --   --   --   MONOABS 0.5 0.5  --   --   --   --   EOSABS 0.2 0.1  --   --   --   --   BASOSABS 0.1 0.1  --   --   --   --     Recent Labs  Lab 08/03/23 0412 08/04/23 0404 08/05/23 0358 08/07/23 0500 08/09/23 0502  NA 135 136 132* 137 136  K 4.0 3.8 3.6 3.9 3.9  CL 110 106 102 105 106  CO2 18* 20* 19* 25 23  GLUCOSE 101* 85 76 81 81  BUN 10 10 8 15  28*  CREATININE 0.58*  0.47* 0.50* 0.58* 0.66  CALCIUM 8.8* 9.0 8.8* 9.2 9.3  AST 24 26 26 26   --   ALT 13 14 16 17   --   ALKPHOS 90 91 92 90  --   BILITOT 0.9 0.7 0.6 0.6  --   ALBUMIN 2.7* 2.7* 2.9* 3.0*  --   MG 1.5* 1.5* 1.4* 1.4* 1.5*    ------------------------------------------------------------------------------------------------------------------ No results for input(s): "CHOL", "HDL", "LDLCALC", "TRIG", "CHOLHDL", "LDLDIRECT" in the last 72 hours.  Lab Results  Component Value Date   HGBA1C 5.2 02/06/2023   ------------------------------------------------------------------------------------------------------------------ No results for input(s): "TSH", "T4TOTAL", "T3FREE", "THYROIDAB" in the last 72 hours.  Invalid input(s): "FREET3"  Cardiac Enzymes No results for input(s): "CKMB", "TROPONINI", "MYOGLOBIN" in the last 168 hours.  Invalid input(s): "CK" ------------------------------------------------------------------------------------------------------------------    Component Value Date/Time   BNP 754.3 (H) 07/12/2023 1801    CBG: No results for input(s): "GLUCAP" in the last 168 hours.  Recent Results (from the past 240 hours)  Culture,  blood (Routine X 2) w Reflex to ID Panel     Status: None   Collection Time: 07/30/23  8:07 PM   Specimen: BLOOD RIGHT ARM  Result Value Ref Range Status   Specimen Description   Final    BLOOD RIGHT ARM AEROBIC BOTTLE ONLY Performed at [facility], [address] [place], [place] [address]    Special Requests   Final    Blood Culture results may not be optimal due to an inadequate volume of blood received in culture bottles BOTTLES DRAWN AEROBIC ONLY Performed at [facility], [address], [place], [place] [address]    Culture   Final    NO GROWTH 5 DAYS Performed at [facility], [address], [place], [place] [address]    Report Status 08/05/2023 FINAL  Final  Culture, blood (Routine X 2) w Reflex to ID Panel     Status: None   Collection Time: 07/30/23  8:07 PM   Specimen: BLOOD RIGHT ARM  Result Value Ref Range Status   Specimen Description   Final    BLOOD RIGHT ARM AEROBIC BOTTLE ONLY Performed at [facility], [address] [place], [place] [address]    Special Requests   Final    Blood Culture results may not be optimal due to an inadequate volume of blood received in culture bottles BOTTLES DRAWN AEROBIC ONLY Performed at [facility], [address], [place], [place] [address]    Culture   Final    NO GROWTH 5 DAYS Performed at [facility], [address], [place], [place] [address]    Report Status 08/05/2023 FINAL  Final     Radiology Studies: No results found.   Pamella Pert, MD, PhD [facility]  Between 7 am - 7 pm I am available, please contact me via Amion (for emergencies) or Securechat (non urgent messages)  Between 7 pm - 7 am I am not available, please contact night coverage MD/APP via Amion

## 2023-08-09 NOTE — TOC Progression Note (Signed)
Transition of Care (TOC) - Progression Note    Patient Details  Name: Anthony A Devine Jr. MRN: [id number] Date of Birth: 02/06/1956  Transition of Care [facility]) CM/SW Contact  Larrie Kass, LCSW Phone Number: 08/09/2023, 12:42 PM  Clinical Narrative:     Noted pt is not a candidate for SNF. Pt will need to return to shelter after hospitalization. TOC will follow for discharge needs.   Expected Discharge Plan: Homeless Shelter Barriers to Discharge: Continued Medical Work up  Expected Discharge Plan and Services                                               Social Determinants of Health (SDOH) Interventions SDOH Screenings   Food Insecurity: Patient Unable To Answer (07/28/2023)  Recent Concern: Food Insecurity - Food Insecurity Present (07/08/2023)  Housing: Patient Unable To Answer (07/28/2023)  Recent Concern: Housing - High Risk (07/08/2023)  Transportation Needs: Patient Unable To Answer (07/28/2023)  Recent Concern: Transportation Needs - Unmet Transportation Needs (07/08/2023)  Utilities: Patient Unable To Answer (07/28/2023)  Recent Concern: Utilities - At Risk (07/08/2023)  Depression (PHQ2-9): Medium Risk (10/16/2020)  Social Connections: Patient Unable To Answer (07/28/2023)  Tobacco Use: High Risk (07/28/2023)    Readmission Risk Interventions    07/12/2023    9:58 AM 02/08/2023    5:25 PM 10/12/2022    1:29 PM  Readmission Risk Prevention Plan  Transportation Screening Complete Complete Complete  Medication Review Oceanographer) Complete Complete Complete  PCP or Specialist appointment within 3-5 days of discharge -- Complete Complete  HRI or Home Care Consult Complete Complete Complete  SW Recovery Care/Counseling Consult Complete Complete Complete  Palliative Care Screening Not Applicable Not Applicable Not Applicable  Skilled Nursing Facility Not Applicable Complete Not Applicable

## 2023-08-10 DIAGNOSIS — F1014 Alcohol abuse with alcohol-induced mood disorder: Secondary | ICD-10-CM | POA: Diagnosis not present

## 2023-08-10 DIAGNOSIS — I4819 Other persistent atrial fibrillation: Secondary | ICD-10-CM | POA: Diagnosis not present

## 2023-08-10 DIAGNOSIS — R410 Disorientation, unspecified: Secondary | ICD-10-CM | POA: Diagnosis not present

## 2023-08-10 NOTE — Progress Notes (Signed)
PROGRESS NOTE  Anthony A Rupert Jr. KGM:[id number] DOB: [birth year] DOA: 07/28/2023 PCP: Pcp, No   LOS: 12 days   Brief Narrative / Interim history: 68 year old male with alcohol use, frequent withdrawals and delirium in the past, PAF, recent PE, not on anticoagulation medication due to homelessness and nonadherence to medical treatment, comes into the hospital from the shelter due to delirium.  Hospital course complicated by A-fib with RVR requiring a diltiazem drip.  He was also found to have a fever of 100.6 on 1/10, but afebrile since.  Cultures negative.  Hospital course also complicated by persistent encephalopathy, etiology not entirely clear but suspect to be due to progressive cognitive issues in the setting of ongoing alcohol use  Subjective / 24h Interval events: Much more alert today, much more talkative although remains intermittently confused and tangential at times  Assesement and Plan: Principal Problem:   Delirium Active Problems:   Alcohol abuse with alcohol-induced mood disorder (HCC)   Alcohol withdrawal delirium (HCC)   Agitation  Principal problem Acute metabolic encephalopathy, delirium, underlying EtOH use -patient has having episodes like this in the past, noted several times during prior hospitalizations and thought to be in the setting of alcohol withdrawal/DTs.   -Thiamine levels normal, but has received empirical high-dose thiamine -Ammonia normal, B12 on the lower end, provide B12 injection x 1 -MRI of the brain x 2 was negative for acute findings.  EEG did not show seizures or epileptiform discharges -RPR negative -Psychiatry consulted, appreciate input. -Discussed with the [facility] with neurology, case reviewed and images reviewed.  Given that he has been having intermittent confusions with prior hospitalizations, this may suggest an underlying neurodegenerative disorder possibly in the setting of longstanding EtOH use.  This is likely to have progressed and  may be the main culprit here -SLP consult for cognitive eval, suspect he will be unable to take care of himself, and given homelessness disposition may be very difficult  Active problems Recurrent pulmonary embolism-currently on Lovenox as he is refusing oral medications intermittently.  Most recent PE was seen December 2024.  Remains hemodynamically stable  Fever-unclear etiology, cultures are negative, has received 5 days of empiric antibiotics, discontinued 1/14.  He was initially on cefepime which was thought to may have contributed to #1, has been transitioned to Zosyn.  Continue to monitor off antibiotics, he is remaining afebrile  Essential hypertension-has been placed on metoprolol.  Blood pressure acceptable, he is rate controlled  PAF, with RVR -more agreeable to take metoprolol, rate controlled  Chronic systolic CHF-EF 40-45%, global hypokinesis.  Cardiology consulted, GDMT is limited by noncompliance as well as intermittent hypotension.  Continue medical management  Hypokalemia, hypomagnesemia-continue to monitor and replace as indicated  Hyponatremia-resolved  Macrocytic anemia-monitor, no bleeding  Scheduled Meds:  enoxaparin (LOVENOX) injection  65 mg Subcutaneous Q12H   feeding supplement  237 mL Oral BID BM   folic acid  1 mg Oral Daily   metoprolol succinate  50 mg Oral Daily   multivitamin with minerals  1 tablet Oral Daily   OLANZapine zydis  2.5 mg Oral QHS   thiamine  100 mg Oral Daily   Or   thiamine  100 mg Intravenous Daily   Continuous Infusions:   PRN Meds:.acetaminophen **OR** acetaminophen, hydrALAZINE, hydrOXYzine, ipratropium-albuterol, metoprolol tartrate, OLANZapine, ondansetron (ZOFRAN) IV  Current Outpatient Medications  Medication Instructions   acetaminophen (TYLENOL) 500 mg, Oral, Every 8 hours PRN   apixaban (ELIQUIS) 5 MG TABS tablet Take 2 tablets daily (10 mg  total) twice daily for 4 days then on Dec 27, reduce to 1 tablet (5 mg total)  twice daily.   FeroSul 325 mg, Oral, Daily with breakfast   folic acid (FOLVITE) 1 mg, Oral, Daily   magnesium oxide (MAG-OX) 400 mg, Oral, 2 times daily   metoprolol tartrate (LOPRESSOR) 25 mg, Oral, 2 times daily   mometasone-formoterol (DULERA) 200-5 MCG/ACT AERO 2 puffs, Inhalation, 2 times daily   pantoprazole (PROTONIX) 40 mg, Oral, Daily   rosuvastatin (CRESTOR) 10 mg, Oral, Daily   thiamine (VITAMIN B1) 100 mg, Oral, Daily    Diet Orders (From admission, onward)     Start     Ordered   08/04/23 1400  Diet regular Fluid consistency: Thin  Diet effective now       Question:  Fluid consistency:  Answer:  Thin   08/04/23 1359            DVT prophylaxis: SCDs Start: 07/28/23 1549   Lab Results  Component Value Date   PLT 263 08/09/2023      Code Status: Full Code  Family Communication: no family at bedside   Status is: Inpatient Remains inpatient appropriate because: severity of illness  Level of care: Progressive  Consultants:  Psychiatry   Objective: Vitals:   08/09/23 1222 08/09/23 1224 08/09/23 2136 08/10/23 0411  BP: (!) 87/69 (!) 89/63 99/75 116/76  Pulse: (!) 110 (!) 109 (!) 103 100  Resp: 18 18 18 18   Temp: 97.6 F (36.4 C)  98.3 F (36.8 C) 98 F (36.7 C)  TempSrc: Oral  Oral Oral  SpO2: 100% 100% 100% 100%  Weight:      Height:        Intake/Output Summary (Last 24 hours) at 08/10/2023 1148 Last data filed at 08/10/2023 0911 Gross per 24 hour  Intake 840 ml  Output 700 ml  Net 140 ml    Wt Readings from Last 3 Encounters:  07/28/23 64.7 kg  07/12/23 67.1 kg  03/06/23 64 kg    Examination:  Constitutional: NAD Eyes: lids and conjunctivae normal, no scleral icterus ENMT: mmm Neck: normal, supple Respiratory: clear to auscultation bilaterally, no wheezing, no crackles.  Cardiovascular: Irregular, no edema Abdomen: soft, no distention, no tenderness. Bowel sounds positive.   Data Reviewed: I have independently reviewed  following labs and imaging studies   CBC Recent Labs  Lab 08/04/23 0404 08/05/23 0358 08/06/23 0414 08/07/23 0500 08/09/23 0502  WBC 3.3* 3.2* 3.9* 3.8* 3.2*  HGB 9.9* 10.7* 10.4* 10.5* 10.7*  HCT 31.3* 32.9* 32.2* 32.8* 34.5*  PLT 240 269 245 236 263  MCV 98.4 97.9 98.8 99.1 101.8*  MCH 31.1 31.8 31.9 31.7 31.6  MCHC 31.6 32.5 32.3 32.0 31.0  RDW 17.1* 17.2* 17.0* 16.7* 17.0*  LYMPHSABS 1.3  --   --   --   --   MONOABS 0.5  --   --   --   --   EOSABS 0.1  --   --   --   --   BASOSABS 0.1  --   --   --   --     Recent Labs  Lab 08/04/23 0404 08/05/23 0358 08/07/23 0500 08/09/23 0502  NA 136 132* 137 136  K 3.8 3.6 3.9 3.9  CL 106 102 105 106  CO2 20* 19* 25 23  GLUCOSE 85 76 81 81  BUN 10 8 15  28*  CREATININE 0.47* 0.50* 0.58* 0.66  CALCIUM 9.0 8.8* 9.2 9.3  AST  26 26 26   --   ALT 14 16 17   --   ALKPHOS 91 92 90  --   BILITOT 0.7 0.6 0.6  --   ALBUMIN 2.7* 2.9* 3.0*  --   MG 1.5* 1.4* 1.4* 1.5*    ------------------------------------------------------------------------------------------------------------------ No results for input(s): "CHOL", "HDL", "LDLCALC", "TRIG", "CHOLHDL", "LDLDIRECT" in the last 72 hours.  Lab Results  Component Value Date   HGBA1C 5.2 02/06/2023   ------------------------------------------------------------------------------------------------------------------ No results for input(s): "TSH", "T4TOTAL", "T3FREE", "THYROIDAB" in the last 72 hours.  Invalid input(s): "FREET3"  Cardiac Enzymes No results for input(s): "CKMB", "TROPONINI", "MYOGLOBIN" in the last 168 hours.  Invalid input(s): "CK" ------------------------------------------------------------------------------------------------------------------    Component Value Date/Time   BNP 754.3 (H) 07/12/2023 1801    CBG: No results for input(s): "GLUCAP" in the last 168 hours.  No results found for this or any previous visit (from the past 240 hours).    Radiology  Studies: MR BRAIN WO CONTRAST Result Date: 08/09/2023 CLINICAL DATA:  Initial evaluation for mental status change, unknown cause. EXAM: MRI HEAD WITHOUT CONTRAST TECHNIQUE: Multiplanar, multiecho pulse sequences of the brain and surrounding structures were obtained without intravenous contrast. COMPARISON:  Prior brain MRI from 07/30/2023 FINDINGS: Brain: Diffuse prominence of the CSF containing spaces compatible with generalized cerebral atrophy, advanced for age. Minimal patchy T2/FLAIR hyperintensity noted involving the supratentorial cerebral white matter, most likely related to chronic microvascular ischemic disease, less than is typically seen for patient age. No evidence for acute or subacute ischemia. Gray-white matter differentiation maintained. No areas of chronic cortical infarction. No acute or chronic intracranial blood products. No mass lesion, midline shift or mass effect. No hydrocephalus or extra-axial fluid collection. Pituitary gland and suprasellar region within normal limits. Vascular: Major intracranial vascular flow voids are maintained. Skull and upper cervical spine: Craniocervical junction within normal limits. Bone marrow signal intensity normal. No scalp soft tissue abnormality. Sinuses/Orbits: Prior bilateral ocular lens replacement. Paranasal sinuses are largely clear. Trace bilateral mastoid effusions noted, of doubtful significance. Negative nasopharynx. Other: None. IMPRESSION: 1. No acute intracranial abnormality. 2. Advanced cerebral atrophy for age. Electronically Signed   By: Rise Mu M.D.   On: 08/09/2023 20:36     Pamella Pert, MD, PhD [facility]  Between 7 am - 7 pm I am available, please contact me via Amion (for emergencies) or Securechat (non urgent messages)  Between 7 pm - 7 am I am not available, please contact night coverage MD/APP via Amion

## 2023-08-10 NOTE — Plan of Care (Signed)
  Problem: Activity: Goal: Risk for activity intolerance will decrease Outcome: Progressing   Problem: Coping: Goal: Level of anxiety will decrease Outcome: Progressing   Problem: Safety: Goal: Ability to remain free from injury will improve Outcome: Progressing   

## 2023-08-10 NOTE — Progress Notes (Signed)
Mobility Specialist - Progress Note   08/10/23 1309  Mobility  Activity Ambulated with assistance in hallway  Level of Assistance Contact guard assist, steadying assist  Assistive Device Other (Comment) (HHA)  Distance Ambulated (ft) 350 ft  Activity Response Tolerated well  Mobility Referral Yes  Mobility visit 1 Mobility  Mobility Specialist Start Time (ACUTE ONLY) 1245  Mobility Specialist Stop Time (ACUTE ONLY) 1309  Mobility Specialist Time Calculation (min) (ACUTE ONLY) 24 min   Pt received in bed and agreeable to mobility. No complaints during session. Pt to bed after session with all needs met. Sitter in room.   [facility]

## 2023-08-11 DIAGNOSIS — R410 Disorientation, unspecified: Secondary | ICD-10-CM | POA: Diagnosis not present

## 2023-08-11 LAB — COMPREHENSIVE METABOLIC PANEL
ALT: 24 U/L (ref 0–44)
AST: 35 U/L (ref 15–41)
Albumin: 3.3 g/dL — ABNORMAL LOW (ref 3.5–5.0)
Alkaline Phosphatase: 86 U/L (ref 38–126)
Anion gap: 9 (ref 5–15)
BUN: 30 mg/dL — ABNORMAL HIGH (ref 8–23)
CO2: 23 mmol/L (ref 22–32)
Calcium: 9.6 mg/dL (ref 8.9–10.3)
Chloride: 107 mmol/L (ref 98–111)
Creatinine, Ser: 0.56 mg/dL — ABNORMAL LOW (ref 0.61–1.24)
GFR, Estimated: >60 mL/min (ref >60)
Glucose, Bld: 89 mg/dL (ref 70–99)
Potassium: 3.9 mmol/L (ref 3.5–5.1)
Sodium: 139 mmol/L (ref 135–145)
Total Bilirubin: 0.5 mg/dL (ref 0.0–1.2)
Total Protein: 7.3 g/dL (ref 6.5–8.1)

## 2023-08-11 LAB — CBC
HCT: 34.5 % — ABNORMAL LOW (ref 39.0–52.0)
Hemoglobin: 10.6 g/dL — ABNORMAL LOW (ref 13.0–17.0)
MCH: 31.5 pg (ref 26.0–34.0)
MCHC: 30.7 g/dL (ref 30.0–36.0)
MCV: 102.7 fL — ABNORMAL HIGH (ref 80.0–100.0)
Platelets: 235 10*3/uL (ref 150–400)
RBC: 3.36 MIL/uL — ABNORMAL LOW (ref 4.22–5.81)
RDW: 16.8 % — ABNORMAL HIGH (ref 11.5–15.5)
WBC: 4.4 10*3/uL (ref 4.0–10.5)
nRBC: 0 % (ref 0.0–0.2)

## 2023-08-11 LAB — MAGNESIUM: Magnesium: 1.6 mg/dL — ABNORMAL LOW (ref 1.7–2.4)

## 2023-08-11 LAB — PHOSPHORUS: Phosphorus: 4.2 mg/dL (ref 2.5–4.6)

## 2023-08-11 MED ORDER — VITAMIN B-12 100 MCG PO TABS
500.0000 ug | ORAL_TABLET | Freq: Every day | ORAL | Status: DC
  Administered 2023-08-11 – 2023-08-27 (×17): 500 ug via ORAL
  Filled 2023-08-11 (×16): qty 5

## 2023-08-11 MED ORDER — FERROUS SULFATE 325 (65 FE) MG PO TABS
325.0000 mg | ORAL_TABLET | Freq: Every day | ORAL | Status: DC
  Administered 2023-08-11 – 2023-08-27 (×17): 325 mg via ORAL
  Filled 2023-08-11 (×17): qty 1

## 2023-08-11 MED ORDER — MAGNESIUM SULFATE 4 GM/100ML IV SOLN
4.0000 g | Freq: Once | INTRAVENOUS | Status: AC
  Administered 2023-08-11: 4 g via INTRAVENOUS
  Filled 2023-08-11: qty 100

## 2023-08-11 NOTE — Progress Notes (Signed)
Occupational Therapy Treatment Patient Details Name: Anthony [name]. MRN: [id number] DOB: 09-May-1956 Today's Date: 08/11/2023   History of present illness Pt is 68 yo male admitted on 07/28/23 from homeless shelter due to delirium. Pt dx with acute metabolic encephalopathy, delirium, underlying EtOH use, fever unclear etiology, hypokalemia, and hyponatremia.  Pt with hx of recurrent PE, frequent withdrawals and delirium, PAF   OT comments  Pt making progress with functional goals. Pt alert and cooperative. Pt engaged in functional mobility walking to bathroom 1 person HHA CGA. Pt transferred on and off toilet safety with CGA. Despite multilpe verbal cues and prompts, pt declined to wash face and hands while standing at sink, however pt did comb his hair and eventually washed his hands and face with set up/Sup once seated EOB at end of session. OT wil continue to follow acutely to maximize level of function and safety      If plan is discharge home, recommend the following:  [name] help with walking and/or transfers;[name] help with bathing/dressing/bathroom;Assistance with cooking/housework;Assist for transportation;Supervision due to cognitive status   Equipment Recommendations       Recommendations for Other Services      Precautions / Restrictions Precautions Precautions: Fall Precaution Comments: has sitter Restrictions Weight Bearing Restrictions Per Provider Order: No       Mobility Bed Mobility Overal bed mobility: Needs Assistance Bed Mobility: Supine to Sit, Sit to Supine     Supine to sit: Supervision Sit to supine: Supervision        Transfers Overall transfer level: Needs assistance Equipment used: 1 person hand held assist Transfers: Sit to/from Stand Sit to Stand: Contact guard assist                 Balance Overall balance assessment: Needs assistance Sitting-balance support: Feet supported Sitting balance-Leahy Scale: Good     Standing  balance support: Bilateral upper extremity supported, During functional activity Standing balance-Leahy Scale: Fair                             ADL either performed or assessed with clinical judgement   ADL Overall ADL's : Needs assistance/impaired Eating/Feeding: Independent   Grooming: Brushing hair;Standing;Contact guard assist           Upper Body Dressing : Contact guard assist;Standing       Toilet Transfer: Contact guard assist;Regular Toilet;Grab bars   Toileting- Clothing Manipulation and Hygiene: Supervision/safety;Sit to/from stand       Functional mobility during ADLs: Contact guard assist General ADL Comments: pt alert and very talkative. Despite multilpe verbal cues and prompts pt declined to wash face and hands while standing at sink, however pt did wsh hands and face with set up/Sup once seated EOB at end of session    Extremity/Trunk Assessment Upper Extremity Assessment Upper Extremity Assessment: Overall WFL for tasks assessed   Lower Extremity Assessment Lower Extremity Assessment: Defer to PT evaluation        Vision Baseline Vision/History: 1 Wears glasses Ability to See in Adequate Light: 0 Adequate Patient Visual Report: No change from baseline     Perception     Praxis      Cognition Arousal: Alert Behavior During Therapy: WFL for tasks assessed/performed Overall Cognitive Status: Difficult to assess (tangential speech)                         Following Commands:  Follows multi-step commands inconsistently, Follows one step commands with increased time                Exercises      Shoulder Instructions       General Comments      Pertinent Vitals/ Pain       Pain Assessment Pain Assessment: No/denies pain Pain Score: 0-No pain Pain Intervention(s): Monitored during session, Repositioned  Home Living                                          Prior Functioning/Environment               Frequency  Min 1X/week        Progress Toward Goals  OT Goals(current goals can now be found in the care plan section)  Progress towards OT goals: Progressing toward goals     Plan      Co-evaluation      Reason for Co-Treatment: For patient/therapist safety;Necessary to address cognition/behavior during functional activity   OT goals addressed during session: ADL's and self-care      AM-PAC OT "6 Clicks" Daily Activity     Outcome Measure   Help from another person eating meals?: None Help from another person taking care of personal grooming?: [name] Help from another person toileting, which includes using toliet, bedpan, or urinal?: [name] Help from another person bathing (including washing, rinsing, drying)?: [name] Help from another person to put on and taking off regular upper body clothing?: [name] Help from another person to put on and taking off regular lower body clothing?: [name] 6 Click Score: 19    End of Session Equipment Utilized During Treatment: Gait belt  OT Visit Diagnosis: Unsteadiness on feet (R26.81);Muscle weakness (generalized) (M62.81);Other symptoms and signs involving cognitive function   Activity Tolerance Patient tolerated treatment well   Patient Left in bed;with call bell/phone within reach;with nursing/sitter in room   Nurse Communication          Time: 0102-7253 OT Time Calculation (min): 23 min  Charges: OT General Charges $OT Visit: 1 Visit OT Treatments $Self Care/Home Management : 8-22 mins   Galen Manila 08/11/2023, 11:59 AM

## 2023-08-11 NOTE — Progress Notes (Signed)
PROGRESS NOTE    Anthony A Shevlin Jr.  HQI:[id number] DOB: 01/24/1956 DOA: 07/28/2023 PCP: Pcp, No   Brief Narrative: 68 year old with past medical history significant for alcohol use, frequent withdrawal and delirium in the past, PAF, recent PE not on anticoagulation due to homelessness and nonadherence to medical treatment presents to the hospital from shelter due to delirium.  Hospital course complicated by A-fib with RVR requiring diltiazem drip.  He was also found to have fever 100.6 on 1/10, since then fever has resolved.  Cultures negative.  Hospital course also complicated by persistent encephalopathy, etiology not entirely clear but suspect due to progressive cognitive issues in the setting of ongoing alcohol use.   Assessment & Plan:   Principal Problem:   Delirium Active Problems:   Alcohol abuse with alcohol-induced mood disorder (HCC)   Alcohol withdrawal delirium (HCC)   Agitation  Acute metabolic encephalopathy, delirium, underlying EtOH use -patient has having episodes like this in the past, noted several times during prior hospitalizations and thought to be in the setting of alcohol withdrawal/DTs.   -Thiamine levels normal, but has received empirical high-dose thiamine -Ammonia normal, B12 on the lower end, provide B12 injection x 1. Started oral supplement.  -MRI of the brain x 2 was negative for acute findings.  EEG did not show seizures or epileptiform discharges. -RPR negative -Psychiatry consulted, appreciate input. -Dr Elvera Lennox discussed with Dr. Amada Jupiter with neurology, case reviewed and images reviewed.  Given that he has been having intermittent confusions with prior hospitalizations, this may suggest an underlying neurodegenerative disorder possibly in the setting of longstanding EtOH use.  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  Most recent PE was seen December 2024.  Remains hemodynamically stable   Fever-unclear etiology, cultures are negative, has received 5 days of empiric antibiotics, discontinued 1/14.  He has remained afebrile.   Essential hypertension -Continue with metoprolol  PAF, with RVR - Continue with metoprolol   Chronic systolic CHF-EF 40-45%, global hypokinesis.  Cardiology consulted, GDMT is limited by noncompliance as well as intermittent hypotension.  Continue medical management   Hypokalemia, hypomagnesemia Will give IV magnesium today.  Hyponatremia -Resolved   Iron deficiency anemia -monitor hemoglobin -Started on ferrous sulfate  Estimated body mass index is 21.69 kg/m as calculated from the following:   Height as of this encounter: 5\' 8"  (1.727 m).   Weight as of this encounter: 64.7 kg.   DVT prophylaxis: Lovenox Code Status: Full code Family Communication: Disposition Plan:  Status is: Inpatient Remains inpatient appropriate because: management of confusion.     Consultants:  Psych   Procedures:    Antimicrobials:    Subjective: He is alert, confuse , no new complaints.   Objective: Vitals:   08/10/23 0411 08/10/23 1306 08/10/23 2031 08/11/23 0536  BP: 116/76 110/78 119/87 97/70  Pulse: 100 (!) 58 80 (!) 102  Resp: 18   16  Temp: 98 F (36.7 C)  97.9 F (36.6 C) 98.4 F (36.9 C)  TempSrc: Oral  Oral Oral  SpO2: 100% (!) 87% 96% 98%  Weight:      Height:        Intake/Output Summary (Last 24 hours) at 08/11/2023 1650 Last data filed at 08/11/2023 1500 Gross per 24  hour  Intake 1290 ml  Output 950 ml  Net 340 ml   Filed Weights   07/28/23 2159  Weight: 64.7 kg    Examination:  General exam: Appears calm and comfortable  Respiratory system: Clear to auscultation. Respiratory effort  normal. Cardiovascular system: S1 & S2 heard, RRR. No JVD, murmurs, rubs, gallops or clicks. No pedal edema. Gastrointestinal system: Abdomen is nondistended, soft and nontender. No organomegaly or masses felt. Normal bowel sounds heard. Central nervous system: Alert , follows command.  Extremities: Symmetric 5 x 5 power.    Data Reviewed: I have personally reviewed following labs and imaging studies  CBC: Recent Labs  Lab 08/05/23 0358 08/06/23 0414 08/07/23 0500 08/09/23 0502 08/11/23 0401  WBC 3.2* 3.9* 3.8* 3.2* 4.4  HGB 10.7* 10.4* 10.5* 10.7* 10.6*  HCT 32.9* 32.2* 32.8* 34.5* 34.5*  MCV 97.9 98.8 99.1 101.8* 102.7*  PLT 269 245 236 263 235   Basic Metabolic Panel: Recent Labs  Lab 08/05/23 0358 08/07/23 0500 08/09/23 0502 08/11/23 0401  NA 132* 137 136 139  K 3.6 3.9 3.9 3.9  CL 102 105 106 107  CO2 19* 25 23 23   GLUCOSE 76 81 81 89  BUN 8 15 28* 30*  CREATININE 0.50* 0.58* 0.66 0.56*  CALCIUM 8.8* 9.2 9.3 9.6  MG 1.4* 1.4* 1.5* 1.6*  PHOS 3.8 4.2  --  4.2   GFR: Estimated Creatinine Clearance: 82 mL/min (A) (by C-G formula based on SCr of 0.56 mg/dL (L)). Liver Function Tests: Recent Labs  Lab 08/05/23 0358 08/07/23 0500 08/11/23 0401  AST 26 26 35  ALT 16 17 24   ALKPHOS 92 90 86  BILITOT 0.6 0.6 0.5  PROT 6.8 6.8 7.3  ALBUMIN 2.9* 3.0* 3.3*   No results for input(s): "LIPASE", "AMYLASE" in the last 168 hours. No results for input(s): "AMMONIA" in the last 168 hours. Coagulation Profile: No results for input(s): "INR", "PROTIME" in the last 168 hours. Cardiac Enzymes: No results for input(s): "CKTOTAL", "CKMB", "CKMBINDEX", "TROPONINI" in the last 168 hours. BNP (last 3 results) No results for input(s): "PROBNP" in the last 8760 hours. HbA1C: No results for input(s): "HGBA1C" in the last 72 hours. CBG: No results for input(s): "GLUCAP" in the last 168 hours. Lipid Profile: No results for input(s): "CHOL", "HDL", "LDLCALC", "TRIG",  "CHOLHDL", "LDLDIRECT" in the last 72 hours. Thyroid Function Tests: No results for input(s): "TSH", "T4TOTAL", "FREET4", "T3FREE", "THYROIDAB" in the last 72 hours. Anemia Panel: No results for input(s): "VITAMINB12", "FOLATE", "FERRITIN", "TIBC", "IRON", "RETICCTPCT" in the last 72 hours. Sepsis Labs: No results for input(s): "PROCALCITON", "LATICACIDVEN" in the last 168 hours.  No results found for this or any previous visit (from the past 240 hours).       Radiology Studies: MR BRAIN WO CONTRAST Result Date: 08/09/2023 CLINICAL DATA:  Initial evaluation for mental status change, unknown cause. EXAM: MRI HEAD WITHOUT CONTRAST TECHNIQUE: Multiplanar, multiecho pulse sequences of the brain and surrounding structures were obtained without intravenous contrast. COMPARISON:  Prior brain MRI from 07/30/2023 FINDINGS: Brain: Diffuse prominence of the CSF containing spaces compatible with generalized cerebral atrophy, advanced for age. Minimal patchy T2/FLAIR hyperintensity noted involving the supratentorial cerebral white matter, most likely related to chronic microvascular ischemic disease, less than is typically seen for patient age. No evidence for acute or subacute ischemia. Gray-white matter differentiation maintained. No areas of chronic cortical infarction. No acute or chronic intracranial blood products. No mass lesion, midline shift or mass effect. No hydrocephalus or  extra-axial fluid collection. Pituitary gland and suprasellar region within normal limits. Vascular: Major intracranial vascular flow voids are maintained. Skull and upper cervical spine: Craniocervical junction within normal limits. Bone marrow signal intensity normal. No scalp soft tissue abnormality. Sinuses/Orbits: Prior bilateral ocular lens replacement. Paranasal sinuses are largely clear. Trace bilateral mastoid effusions noted, of doubtful significance. Negative nasopharynx. Other: None. IMPRESSION: 1. No acute intracranial  abnormality. 2. Advanced cerebral atrophy for age. Electronically Signed   By: Rise Mu M.D.   On: 08/09/2023 20:36        Scheduled Meds:  enoxaparin (LOVENOX) injection  65 mg Subcutaneous Q12H   feeding supplement  237 mL Oral BID BM   folic acid  1 mg Oral Daily   metoprolol succinate  50 mg Oral Daily   multivitamin with minerals  1 tablet Oral Daily   OLANZapine zydis  2.5 mg Oral QHS   thiamine  100 mg Oral Daily   Or   thiamine  100 mg Intravenous Daily   vitamin B-12  500 mcg Oral Daily   Continuous Infusions:   LOS: 13 days    Time spent: 35 Minutes    Alejandra Barna A Tracey Stewart, MD [facility]   If 7PM-7AM, please contact night-coverage [email or web address]  08/11/2023, 4:50 PM

## 2023-08-11 NOTE — Evaluation (Signed)
SLP Cancellation Note  Patient Details Name: Anthony A Bastidas Jr. MRN: [id number] DOB: 09-08-1955   Cancelled treatment:        Pt working with PT/OT, will continue efforts for cog eval.   Rolena Infante, MS [facility] SLP Acute Rehab Services Office [phone]    Chales Abrahams 08/11/2023, 5:46 PM

## 2023-08-11 NOTE — Progress Notes (Signed)
Physical Therapy Treatment Patient Details Name: Anthony A Rojero Jr. MRN: [id number] DOB: 1956/05/04 Today's Date: 08/11/2023   History of Present Illness Pt is 67 yo male admitted on 07/28/23 from homeless shelter due to delirium. Pt dx with acute metabolic encephalopathy, delirium, underlying EtOH use, fever unclear etiology, hypokalemia, and hyponatremia.  Pt with hx of recurrent PE, frequent withdrawals and delirium, PAF    PT Comments  Pt agreeable to working with therapy. PT/OT co-tx. Pt participated well on today. Min A overall for safe mobility. He tolerated activity well. Pt has had inconsistent performance with therapies and fluctuating cognitive status. Would benefit from SNF but per [facility] team SNF not an option. Will continue to follow and progress activity as safely able.    If plan is discharge home, recommend the following: A Kasson help with walking and/or transfers;A Shimada help with bathing/dressing/bathroom;Help with stairs or ramp for entrance;Supervision due to cognitive status   Can travel by private vehicle     Yes  Equipment Recommendations       Recommendations for Other Services       Precautions / Restrictions Precautions Precautions: Fall Precaution Comments: has sitter Restrictions Weight Bearing Restrictions Per Provider Order: No     Mobility  Bed Mobility Overal bed mobility: Needs Assistance Bed Mobility: Sit to Supine, Supine to Sit     Supine to sit: Supervision, HOB elevated Sit to supine: Supervision, HOB elevated   General bed mobility comments: Supv for lines    Transfers Overall transfer level: Needs assistance   Transfers: Sit to/from Stand Sit to Stand: Contact guard assist           General transfer comment: CGA for safety.    Ambulation/Gait Ambulation/Gait assistance: Min assist Gait Distance (Feet): 250 Feet Assistive device: None Gait Pattern/deviations: Step-through pattern, Decreased stride length, Scissoring        General Gait Details: Intermittent scissoring. Intermittent assist provided to steady. Pt tolerated distance well.   Stairs             Wheelchair Mobility     Tilt Bed    Modified Rankin (Stroke Patients Only)       Balance Overall balance assessment: Needs assistance           Standing balance-Leahy Scale: Fair                              Cognition Arousal: Alert Behavior During Therapy: WFL for tasks assessed/performed Overall Cognitive Status: Difficult to assess (tangential) Area of Impairment: Following commands, Problem solving                       Following Commands: Follows one step commands with increased time     Problem Solving: Requires verbal cues          Exercises      General Comments        Pertinent Vitals/Pain Pain Assessment Pain Assessment: No/denies pain    Home Living                          Prior Function            PT Goals (current goals can now be found in the care plan section) Progress towards PT goals: Progressing toward goals    Frequency    Min 1X/week      PT Plan  Co-evaluation   Reason for Co-Treatment: For patient/therapist safety;Necessary to address cognition/behavior during functional activity   OT goals addressed during session: ADL's and self-care      AM-PAC PT "6 Clicks" Mobility   Outcome Measure  Help needed turning from your back to your side while in a flat bed without using bedrails?: A Sarracino Help needed moving from lying on your back to sitting on the side of a flat bed without using bedrails?: A Nangle Help needed moving to and from a bed to a chair (including a wheelchair)?: A Tayag Help needed standing up from a chair using your arms (e.g., wheelchair or bedside chair)?: A Reicks Help needed to walk in hospital room?: A Delamar Help needed climbing 3-5 steps with a railing? : A Lot 6 Click Score: 17    End of Session Equipment  Utilized During Treatment: Gait belt Activity Tolerance: Patient tolerated treatment well Patient left: in bed;with call bell/phone within reach;with bed alarm set;with nursing/sitter in room   PT Visit Diagnosis: Difficulty in walking, not elsewhere classified (R26.2);Unsteadiness on feet (R26.81)     Time: 7829-5621 PT Time Calculation (min) (ACUTE ONLY): 25 min  Charges:    $Gait Training: 8-22 mins PT General Charges $$ ACUTE PT VISIT: 1 Visit                        Faye Ramsay, PT Acute Rehabilitation  Office: [phone]

## 2023-08-11 NOTE — Plan of Care (Signed)
  Problem: Education: Goal: Knowledge of General Education information will improve Description: Including pain rating scale, medication(s)/side effects and non-pharmacologic comfort measures Outcome: Progressing   Problem: Health Behavior/Discharge Planning: Goal: Ability to manage health-related needs will improve Outcome: Progressing   Problem: Clinical Measurements: Goal: Ability to maintain clinical measurements within normal limits will improve Outcome: Progressing Goal: Will remain free from infection Outcome: Progressing Goal: Diagnostic test results will improve Outcome: Progressing Goal: Respiratory complications will improve Outcome: Progressing Goal: Cardiovascular complication will be avoided Outcome: Progressing   Problem: Activity: Goal: Risk for activity intolerance will decrease Outcome: Progressing   Problem: Nutrition: Goal: Adequate nutrition will be maintained Outcome: Progressing   Problem: Elimination: Goal: Will not experience complications related to bowel motility Outcome: Progressing Goal: Will not experience complications related to urinary retention Outcome: Progressing   Problem: Safety: Goal: Ability to remain free from injury will improve Outcome: Progressing   Problem: Skin Integrity: Goal: Risk for impaired skin integrity will decrease Outcome: Progressing   Problem: Safety: Goal: Violent Restraint(s) Outcome: Progressing   Problem: Safety: Goal: Non-violent Restraint(s) Outcome: Progressing

## 2023-08-12 DIAGNOSIS — R410 Disorientation, unspecified: Secondary | ICD-10-CM | POA: Diagnosis not present

## 2023-08-12 LAB — MAGNESIUM: Magnesium: 1.8 mg/dL (ref 1.7–2.4)

## 2023-08-12 MED ORDER — MIDODRINE HCL 5 MG PO TABS
5.0000 mg | ORAL_TABLET | Freq: Three times a day (TID) | ORAL | Status: DC
  Administered 2023-08-12 – 2023-08-26 (×43): 5 mg via ORAL
  Filled 2023-08-12 (×44): qty 1

## 2023-08-12 MED ORDER — MIDODRINE HCL 5 MG PO TABS
2.5000 mg | ORAL_TABLET | Freq: Once | ORAL | Status: AC
Start: 2023-08-12 — End: 2023-08-12
  Administered 2023-08-12: 2.5 mg via ORAL
  Filled 2023-08-12: qty 1

## 2023-08-12 MED ORDER — METOPROLOL SUCCINATE ER 25 MG PO TB24
25.0000 mg | ORAL_TABLET | Freq: Every day | ORAL | Status: DC
  Administered 2023-08-13 – 2023-08-27 (×13): 25 mg via ORAL
  Filled 2023-08-12 (×15): qty 1

## 2023-08-12 MED ORDER — SODIUM CHLORIDE 0.9 % IV BOLUS
250.0000 mL | Freq: Once | INTRAVENOUS | Status: AC
  Administered 2023-08-12: 250 mL via INTRAVENOUS

## 2023-08-12 MED ORDER — LORAZEPAM 2 MG/ML IJ SOLN
0.5000 mg | Freq: Once | INTRAMUSCULAR | Status: AC
  Administered 2023-08-12: 0.5 mg via INTRAVENOUS
  Filled 2023-08-12: qty 1

## 2023-08-12 MED ORDER — MAGNESIUM OXIDE -MG SUPPLEMENT 400 (240 MG) MG PO TABS
200.0000 mg | ORAL_TABLET | Freq: Two times a day (BID) | ORAL | Status: DC
  Administered 2023-08-12 – 2023-08-27 (×31): 200 mg via ORAL
  Filled 2023-08-12 (×31): qty 1

## 2023-08-12 MED ORDER — MIDODRINE HCL 5 MG PO TABS
2.5000 mg | ORAL_TABLET | Freq: Three times a day (TID) | ORAL | Status: DC
  Administered 2023-08-12: 2.5 mg via ORAL
  Filled 2023-08-12: qty 1

## 2023-08-12 NOTE — Plan of Care (Signed)
  Problem: Activity: Goal: Risk for activity intolerance will decrease Outcome: Progressing   Problem: Nutrition: Goal: Adequate nutrition will be maintained Outcome: Progressing   Problem: Elimination: Goal: Will not experience complications related to urinary retention Outcome: Progressing   Problem: Pain Management: Goal: General experience of comfort will improve Outcome: Progressing   Problem: Safety: Goal: Ability to remain free from injury will improve Outcome: Progressing   Problem: Skin Integrity: Goal: Risk for impaired skin integrity will decrease Outcome: Progressing

## 2023-08-12 NOTE — Progress Notes (Signed)
Speech Language Pathology Evaluation Patient Details Name: Anthony A Boeve Jr. MRN: [id number] DOB: 1955/09/09 Today's Date: 08/12/2023 Time: 1110-1140 SLP Time Calculation (min) (ACUTE ONLY): 30 min  Problem List:  Patient Active Problem List   Diagnosis Date Noted   Agitation 07/29/2023   History of CVA (cerebrovascular accident) 07/11/2023   Acute pulmonary embolism (HCC) 07/08/2023   Lactic acidosis 07/08/2023   Rib fractures 02/04/2023   Hypoglycemia 02/04/2023   Pulmonary embolus (HCC) 02/04/2023   Pulmonary embolism (HCC) 02/03/2023   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 difficile colitis 09/11/2022   Aspiration pneumonia (HCC) 08/27/2022   Aortic atherosclerosis (HCC) 08/27/2022   Depression, unspecified 08/09/2022   Hyponatremia 06/01/2022   Non compliance w medication regimen 05/31/2022   Delirium 05/25/2022   Stricture and stenosis of esophagus 05/21/2022   Gastritis and gastroduodenitis 05/21/2022   Loose stools 05/20/2022   Malnutrition of moderate degree 05/20/2022   Acute on chronic heart failure (HCC)    Leukocytosis 05/19/2022   Sepsis (HCC) 05/19/2022   Cellulitis of left leg, possible 05/19/2022    Class: Question of   Moniliasis, interdigital 05/19/2022   Open toe wound, right foot, medial fourth digit 05/19/2022   Malnutrition (HCC) 05/19/2022   Alcohol abuse 03/24/2022   Dyslipidemia 03/24/2022   Acute on chronic diastolic CHF (congestive heart failure) (HCC) 03/24/2022   Acute respiratory failure with hypoxia (HCC) 03/23/2022   Generalized weakness    Atrial fibrillation with rapid ventricular response (HCC) 03/15/2022   Hypokalemia 03/15/2022   Decreased  functional mobility and endurance 06/22/2021   Iron deficiency anemia 10/16/2020   Cough    Hypomagnesemia    Protein-calorie malnutrition, severe 08/22/2020   Alcohol use disorder, severe, in controlled environment (HCC)    Pressure injury of skin 08/18/2020   Urinary tract infection without hematuria    Severe sepsis (HCC) 08/14/2020   Paroxysmal atrial fibrillation (HCC) 08/13/2020   Rhabdomyolysis 07/22/2020   Cerebral ventriculomegaly 07/22/2020   Hemoglobin decreased 07/22/2020   History of delirium tremons 07/22/2020   Hypoalbuminemia due to protein-calorie malnutrition (HCC) 07/22/2020   Tobacco use disorder 07/22/2020   Alcohol withdrawal delirium (HCC)    Dehydration    Alcohol abuse with alcohol-induced mood disorder (HCC) 07/18/2018   Depression 05/26/2018   Anxiety state 04/25/2018   Need for immunization against influenza 04/25/2018   Alcohol withdrawal (HCC) 09/01/2017   DOE (dyspnea on exertion) 11/27/2016   Actinic keratosis 11/27/2016   Macrocytic anemia 11/23/2016   History of pulmonary embolus (PE) 11/02/2016   Hiatal hernia 09/14/2016   Skin cancer 08/13/2016   Poor social situation 06/09/2013   Tinea versicolor 06/08/2013   Back pain 05/07/2013   Seborrheic dermatitis of scalp 11/08/2012   HTN (hypertension) 04/11/2012   Alcohol use disorder, severe, dependence (HCC) 09/16/2006   Past Medical History:  Past Medical History:  Diagnosis Date   Actinic keratosis 11/27/2016   Alcohol abuse with alcohol-induced mood disorder (HCC) 07/18/2018   Alcohol use disorder, severe, dependence (HCC) 09/16/2006   05/22/2015 Argumentative, belligerent and verbally abusive to staff per his note from [place]    Alcoholism [facility])    Anxiety state 04/25/2018   Aortic atherosclerosis (HCC) 08/27/2022   Chronic low back pain 09/16/2006   Followed  by NS.  Per his chart from [place], he fell off two storyhouse roof while cleaning his brother's gutters and  sustained compression fracture of L3, L4 and L5 and fracture of right transverse process at L3 and nondisplaced fracture of left glenoid rim many years ago. He had multiple imaging in [place] including Lumbar MRI in 2016 which showed moderate to severe spinal canal stenos   Delirium tremens (HCC) 09/01/2017   Depression    Dry eye 11/23/2016   Foreign body in middle portion of esophagus 05/19/2022   Hiatal hernia 09/14/2016   Status Collis gastroplasty and Nissen's fundoplication on 04/29/2015 in [place]   History of delirium tremons 07/22/2020   DTs during 10/2016 08/2017. And 12/2017 admissions    History of pulmonary embolus (PE) 11/02/2016   Unprovoked 11/01/2016. Xarelto from 11/01/16 to 09/08/2017.   Hydrocele    Surgically corrected   Hypertension    Hypoalbuminemia due to protein-calorie malnutrition (HCC) 07/22/2020   Lumbar compression fracture (HCC)    Multiple rib fractures 07/22/2019   Left rib details XR: left ribs demonstrate multiple remote rib   Pancytopenia (HCC)    Rhabdomyolysis 07/22/2020   Skin cancer    exciced 2017   Tinea versicolor 06/08/2013   Tobacco use disorder 07/22/2020   Tooth infection 04/25/2018   Trigger finger, acquired 11/18/2012   Ulnar tunnel syndrome of right wrist 11/08/2012   Past Surgical History:  Past Surgical History:  Procedure Laterality Date   BIOPSY  05/21/2022   Procedure: BIOPSY;  Surgeon: Lynann Bologna, MD;  Location: [facility] ENDOSCOPY;  Service: Gastroenterology;;   CATARACT EXTRACTION     ESOPHAGOGASTRODUODENOSCOPY (EGD) WITH PROPOFOL N/A 05/21/2022   Procedure: ESOPHAGOGASTRODUODENOSCOPY (EGD) WITH PROPOFOL;  Surgeon: Lynann Bologna, MD;  Location[facility] ENDOSCOPY;  Service: Gastroenterology;  Laterality: N/A;   FOREIGN BODY REMOVAL N/A 05/21/2022   Procedure: FOREIGN BODY REMOVAL;  Surgeon: Lynann Bologna, MD;  Location: [facility] ENDOSCOPY;  Service: Gastroenterology;  Laterality: N/A;   HIATAL HERNIA REPAIR  2016   HYDROCELE  EXCISION / REPAIR  2010   SKIN CANCER EXCISION Left    Excised 2017   HPI:  Patient is a 68 year old male admitted to [facility] long hospital delirium.  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  Cognitive-linguistic evaluation ordered.  Prior evaluations completed in December 24 show patient scored 16 out of 30 on the slums indicative of significant cognitive impairment.  In addition he also was noted in May 2024 to have impaired attention short-term memory reasoning and have tangential speech.  Language was intact at that point tangentiality was due to his cognitive deficits.  At that time SLP recommended no follow-up but did advise increased support for him.  Current MRI completed 08/09/2023 was negative for acute findings.  Patient has been seen for dysphagia during this hospital course mostly due to altered mental status and was able to have his diet advanced.   Assessment / Plan / Recommendation Clinical Impression  Short Blessed administered today with patient scoring 23 of 28 per  authors highly negative indicative of severe cognitive deficits.   Per testing authors scoring interpretation is as follows :  0-4 normal cognition, 5-9 questional impairment and 10 or more significant impairement.   Patient was disoriented to specific month (even with choice of 2) but with correct year.    He was very tangential during  evaluation often speaking off topic and not answering SLP questions - due to cognitive deficits. He does appear to have some hearing loss thus SLP assured that he was able to hear this clinician for proper evaluation.    Patient reported complaints of issues with cataracts that he states he has previously had removed, advised he would need to follow-up with outpatient regarding his cataracts.  Despite reported visual  deficits, he was able to read the clock and basic hospital signs in his room.  Patient states close vision is better than  distance.    Patient demonstrates impaired memory, problem-solving, judgment and reasoning and has poor awareness to his deficits.   His speech and language skills are intact.  Patient is able to verbalize his wants and needs easily.  Suspect that this patient is near his baseline per review of prior SLP evaluations conducted in December 2024 and May 2024 (when he had a left frontal gyrus acute/subacute CVA).    Recommend consider increased support for patient but do not believe he would benefit from SLP follow-up. Of note, patient underwent the SLUMS in December 2024 scoring 16 of 30 at that time.    SLP Assessment  SLP Recommendation/Assessment: Patient does not need any further Speech Lanaguage Pathology Services SLP Visit Diagnosis: Cognitive communication deficit (R41.841)    Recommendations for follow up therapy are one component of a multi-disciplinary discharge planning process, led by the attending physician.  Recommendations may be updated based on patient status, additional functional criteria and insurance authorization.    Follow Up Recommendations    N/a   Assistance Recommended at Discharge   N/a  Functional Status Assessment Patient has not had a recent decline in their functional status- suspect he is near baseline  Frequency and Duration   N/a        SLP Evaluation Cognition  Overall Cognitive Status: No family/caregiver present to determine baseline cognitive functioning Arousal/Alertness: Awake/alert Orientation Level: Oriented to person;Disoriented to place;Disoriented to time;Disoriented to situation Year: 2025 Month: July Day of Week: Incorrect Attention: Sustained Sustained Attention: Impaired Sustained Attention Impairment: Verbal basic;Verbal complex Memory: Impaired Memory Impairment: Storage deficit;Retrieval deficit Awareness:  Impaired Awareness Impairment: Other (comment) (online) Problem Solving: Impaired Problem Solving Impairment: Verbal complex;Verbal basic Behaviors: Other (comment);Impulsive Safety/Judgment: Impaired       Comprehension  Auditory Comprehension Overall Auditory Comprehension: Impaired Commands: Impaired One Step Basic Commands: 75-100% accurate Two Step Basic Commands: 25-49% accurate Interfering Components: Hearing;Attention;Processing speed;Working memory EffectiveTechniques: Extra processing time;Repetition;Increased volume Reading Comprehension Reading Status: Within funtional limits (able to read the signs on the wall and the date)    Expression Expression Primary Mode of Expression: Verbal Verbal Expression Overall Verbal Expression: Impaired Initiation: No impairment Automatic Speech: Name;Social Response Level of Generative/Spontaneous Verbalization: Conversation Naming: Not tested Pragmatics: Impairment Impairments: Turn Taking;Topic maintenance Interfering Components: Premorbid deficit;Attention Non-Verbal Means of Communication: Not applicable Written Expression Dominant Hand: Right Written Expression: Not tested   Oral / Motor  Oral Motor/Sensory Function Overall Oral Motor/Sensory Function: Within functional limits Motor Speech Overall Motor Speech: Appears within functional limits for tasks assessed Respiration: Within functional limits Phonation: Normal Resonance: Within functional limits Articulation: Within functional limitis Intelligibility: Intelligibility reduced Word: 75-100% accurate Phrase: 75-100% accurate Sentence: 75-100% accurate Conversation: 75-100% accurate Motor Planning: Witnin functional limits Motor Speech Errors: Not applicable Effective Techniques: Slow rate            Chales Abrahams 08/12/2023, 2:58 PM  Rolena Infante, MS [facility] SLP Acute Rehab Services  Office [phone]

## 2023-08-12 NOTE — Progress Notes (Signed)
PROGRESS NOTE    Anthony A Nanney Jr.  ZOX:[id number] DOB: Jun 01, 1956 DOA: 07/28/2023 PCP: Pcp, No   Brief Narrative: 68 year old with past medical history significant for alcohol use, frequent withdrawal and delirium in the past, PAF, recent PE not on anticoagulation due to homelessness and nonadherence to medical treatment presents to the hospital from shelter due to delirium.  Hospital course complicated by A-fib with RVR requiring diltiazem drip.  He was also found to have fever 100.6 on 1/10, since then fever has resolved.  Cultures negative.  Hospital course also complicated by persistent encephalopathy, etiology not entirely clear but suspect due to progressive cognitive issues in the setting of ongoing alcohol use.   Assessment & Plan:   Principal Problem:   Delirium Active Problems:   Alcohol abuse with alcohol-induced mood disorder (HCC)   Alcohol withdrawal delirium (HCC)   Agitation  Acute metabolic encephalopathy, delirium, underlying EtOH use -patient has having episodes like this in the past, noted several times during prior hospitalizations and thought to be in the setting of alcohol withdrawal/DTs.   -Thiamine levels normal, but has received empirical high-dose thiamine -Ammonia normal, B12 on the lower end, provide B12 injection x 1. Started oral supplement.  -MRI of the brain x 2 was negative for acute findings.  EEG did not show seizures or epileptiform discharges. -RPR negative -Psychiatry consulted, appreciate input. -Dr Elvera Lennox discussed with Dr. Amada Jupiter with neurology, case reviewed and images reviewed.  Given that he has been having intermittent confusions with prior hospitalizations, this may suggest an underlying neurodegenerative disorder possibly in the setting of longstanding EtOH use.  This is likely to have progressed and may be the main culprit here -SLP consult for cognitive eva score 23/28  suspect he will be unable to take care of himself, and given  homelessness disposition may be very difficult Was agitated last night and this afternoon. Received extra dose of Zyprexa. Monitor.   Recurrent pulmonary embolism-currently on Lovenox as he is refusing oral medications intermittently.  Most recent PE was seen December 2024.  Remains hemodynamically stable   Fever-unclear etiology, cultures are negative, has received 5 days of empiric antibiotics, discontinued 1/14.  He has remained afebrile.   Essential hypertension  BP soft today, holding metoprolol. Started midodrine.   PAF, with RVR - BP low, resume midodrine,. Reduce metoprolol.    Chronic systolic CHF-EF 40-45%, global hypokinesis.  Cardiology consulted, GDMT is limited by noncompliance as well as intermittent hypotension.  Continue medical management   Hypokalemia, hypomagnesemia Replaced as needed  Hyponatremia -Resolved   Iron deficiency anemia -monitor hemoglobin -Started on ferrous sulfate  Estimated body mass index is 21.69 kg/m as calculated from the following:   Height as of this encounter: 5\' 8"  (1.727 m).   Weight as of this encounter: 64.7 kg.   DVT prophylaxis: Lovenox Code Status: Full code Family Communication: Disposition Plan:  Status is: Inpatient Remains inpatient appropriate because: management of confusion.     Consultants:  Psych   Procedures:    Antimicrobials:    Subjective: He has been agitated today  Objective: Vitals:   08/11/23 0536 08/11/23 1809 08/11/23 2132 08/12/23 0445  BP: 97/70 91/68 112/71 118/70  Pulse: (!) 102 95  88  Resp: 16  20   Temp: 98.4 F (36.9 C) 97.6 F (36.4 C) 98.1 F (36.7 C)   TempSrc: Oral Oral Oral   SpO2: 98% 98% 100%   Weight:      Height:  Intake/Output Summary (Last 24 hours) at 08/12/2023 0755 Last data filed at 08/12/2023 0227 Gross per 24 hour  Intake 1527 ml  Output --  Net 1527 ml   Filed Weights   07/28/23 2159  Weight: 64.7 kg    Examination:  General exam:  NAD Respiratory system: CTA Cardiovascular system: S 1, S 2 RRR Central nervous system: Alert Extremities: no edema    Data Reviewed: I have personally reviewed following labs and imaging studies  CBC: Recent Labs  Lab 08/06/23 0414 08/07/23 0500 08/09/23 0502 08/11/23 0401  WBC 3.9* 3.8* 3.2* 4.4  HGB 10.4* 10.5* 10.7* 10.6*  HCT 32.2* 32.8* 34.5* 34.5*  MCV 98.8 99.1 101.8* 102.7*  PLT 245 236 263 235   Basic Metabolic Panel: Recent Labs  Lab 08/07/23 0500 08/09/23 0502 08/11/23 0401  NA 137 136 139  K 3.9 3.9 3.9  CL 105 106 107  CO2 25 23 23   GLUCOSE 81 81 89  BUN 15 28* 30*  CREATININE 0.58* 0.66 0.56*  CALCIUM 9.2 9.3 9.6  MG 1.4* 1.5* 1.6*  PHOS 4.2  --  4.2   GFR: Estimated Creatinine Clearance: 82 mL/min (A) (by C-G formula based on SCr of 0.56 mg/dL (L)). Liver Function Tests: Recent Labs  Lab 08/07/23 0500 08/11/23 0401  AST 26 35  ALT 17 24  ALKPHOS 90 86  BILITOT 0.6 0.5  PROT 6.8 7.3  ALBUMIN 3.0* 3.3*   No results for input(s): "LIPASE", "AMYLASE" in the last 168 hours. No results for input(s): "AMMONIA" in the last 168 hours. Coagulation Profile: No results for input(s): "INR", "PROTIME" in the last 168 hours. Cardiac Enzymes: No results for input(s): "CKTOTAL", "CKMB", "CKMBINDEX", "TROPONINI" in the last 168 hours. BNP (last 3 results) No results for input(s): "PROBNP" in the last 8760 hours. HbA1C: No results for input(s): "HGBA1C" in the last 72 hours. CBG: No results for input(s): "GLUCAP" in the last 168 hours. Lipid Profile: No results for input(s): "CHOL", "HDL", "LDLCALC", "TRIG", "CHOLHDL", "LDLDIRECT" in the last 72 hours. Thyroid Function Tests: No results for input(s): "TSH", "T4TOTAL", "FREET4", "T3FREE", "THYROIDAB" in the last 72 hours. Anemia Panel: No results for input(s): "VITAMINB12", "FOLATE", "FERRITIN", "TIBC", "IRON", "RETICCTPCT" in the last 72 hours. Sepsis Labs: No results for input(s): "PROCALCITON",  "LATICACIDVEN" in the last 168 hours.  No results found for this or any previous visit (from the past 240 hours).       Radiology Studies: No results found.       Scheduled Meds:  enoxaparin (LOVENOX) injection  65 mg Subcutaneous Q12H   feeding supplement  237 mL Oral BID BM   ferrous sulfate  325 mg Oral Q breakfast   folic acid  1 mg Oral Daily   magnesium oxide  200 mg Oral BID   metoprolol succinate  50 mg Oral Daily   multivitamin with minerals  1 tablet Oral Daily   OLANZapine zydis  2.5 mg Oral QHS   thiamine  100 mg Oral Daily   Or   thiamine  100 mg Intravenous Daily   vitamin B-12  500 mcg Oral Daily   Continuous Infusions:   LOS: 14 days    Time spent: 35 Minutes    Chatara Lucente A Ovetta Bazzano, MD [facility]   If 7PM-7AM, please contact night-coverage [email or web address]  08/12/2023, 7:55 AM

## 2023-08-12 NOTE — Progress Notes (Signed)
Notified MD that RN called security on patient. Patient was getting verbally aggressive toward staff, cussing, throwing furniture, trying to shut the door in staffs' face, walking the halls, and not following commands. Once security came and Charge Nurse came to patient's room, patient started to calm down some. RN gave patient PRN IM Zyprexa for agitation and patient calmed down.

## 2023-08-12 NOTE — Progress Notes (Signed)
Short Blessed administered today with patient scoring 23 of 28 per study authors highly negative indicative of severe cognitive deficits.  Full report to follow.  Patient was disoriented to date and situation but oriented to person.  He was very tangential during evaluation often speaking off topic and not answering SLP questions.  He does appear to have some hearing loss thus SLP assured that he was able to hear this clinician for proper evaluation.  Patient reported complaints of issues with cataracts that he states he has previously had removed, advised he would need to follow-up with outpatient regarding his cataracts..  Even so, he was able to read the clock and basic signs on the wall.  Patient states close vision is better than  distance.  Patient demonstrates impaired memory, problem-solving, judgment and has poor awareness to his deficits.  His speech and language skills are intact and his cognition is the source of his tangentiality.  Again suspect that this patient is near his baseline per review of prior SLP evaluations conducted in December 2024 and May 2024.  Recommend consider increased support for patient but do not believe he would benefit from SLP follow-up. Of note, patient underwent the SLUMS in December 2024 scoring 16 of 30 at that time.   Rolena Infante, MS [facility] SLP Acute [employer] [phone]

## 2023-08-12 NOTE — Progress Notes (Signed)
Patient has been restless, agitated, using foul language, and non compliant all night. Still confused. PRN Zyprexa and Atarax given twice during shift but still remains agitated. On call provider notified. Security at bedside to assist with medication administration.

## 2023-08-13 DIAGNOSIS — F1014 Alcohol abuse with alcohol-induced mood disorder: Secondary | ICD-10-CM | POA: Diagnosis not present

## 2023-08-13 DIAGNOSIS — F10131 Alcohol abuse with withdrawal delirium: Secondary | ICD-10-CM

## 2023-08-13 DIAGNOSIS — R410 Disorientation, unspecified: Secondary | ICD-10-CM | POA: Diagnosis not present

## 2023-08-13 DIAGNOSIS — R451 Restlessness and agitation: Secondary | ICD-10-CM | POA: Diagnosis not present

## 2023-08-13 LAB — MAGNESIUM: Magnesium: 1.7 mg/dL (ref 1.7–2.4)

## 2023-08-13 MED ORDER — OLANZAPINE 5 MG PO TABS
2.5000 mg | ORAL_TABLET | Freq: Two times a day (BID) | ORAL | Status: DC
  Administered 2023-08-13 – 2023-08-23 (×21): 2.5 mg via ORAL
  Filled 2023-08-13 (×21): qty 1

## 2023-08-13 MED ORDER — MAGNESIUM SULFATE 2 GM/50ML IV SOLN
2.0000 g | Freq: Once | INTRAVENOUS | Status: AC
  Administered 2023-08-13: 2 g via INTRAVENOUS
  Filled 2023-08-13: qty 50

## 2023-08-13 MED ORDER — STERILE WATER FOR INJECTION IJ SOLN
INTRAMUSCULAR | Status: AC
  Filled 2023-08-13: qty 10

## 2023-08-13 MED ORDER — OLANZAPINE 5 MG PO TABS: 2.5000 mg | ORAL_TABLET | Freq: Every day | ORAL | Status: DC
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
## 2023-08-13 NOTE — Progress Notes (Signed)
PROGRESS NOTE    Anthony A Losey Jr.  WUJ:[id number] DOB: 1955/11/28 DOA: 07/28/2023 PCP: Pcp, No   Brief Narrative: 68 year old with past medical history significant for alcohol use, frequent withdrawal and delirium in the past, PAF, recent PE not on anticoagulation due to homelessness and nonadherence to medical treatment presents to the hospital from shelter due to delirium.  Hospital course complicated by A-fib with RVR requiring diltiazem drip.  He was also found to have fever 100.6 on 1/10, since then fever has resolved.  Cultures negative.  Hospital course also complicated by persistent encephalopathy, etiology not entirely clear but suspect due to progressive cognitive issues in the setting of ongoing alcohol use.   Assessment & Plan:   Principal Problem:   Delirium Active Problems:   Alcohol abuse with alcohol-induced mood disorder (HCC)   Alcohol withdrawal delirium (HCC)   Agitation  Acute metabolic encephalopathy, delirium, underlying EtOH use -patient has having episodes like this in the past, noted several times during prior hospitalizations and thought to be in the setting of alcohol withdrawal/DTs.   -Thiamine levels normal, but has received empirical high-dose thiamine -Ammonia normal, B12 on the lower end, provide B12 injection x 1. Started oral supplement.  -MRI of the brain x 2 was negative for acute findings.  EEG did not show seizures or epileptiform discharges. -RPR negative -Psychiatry consulted, appreciate input. -Dr Elvera Lennox discussed with Dr. Amada Jupiter with neurology, case reviewed and images reviewed.  Given that he has been having intermittent confusions with prior hospitalizations, this may suggest an underlying neurodegenerative disorder possibly in the setting of longstanding EtOH use.  This is likely to have progressed and may be the main culprit here -SLP consult for cognitive eva score 23/28  suspect he will be unable to take care of himself, and given  homelessness disposition may be very difficult -More agitated, psych re-consulted.   Recurrent pulmonary embolism-currently on Lovenox as he is refusing oral medications intermittently.  Most recent PE was seen December 2024.  Remains hemodynamically stable   Fever-unclear etiology, cultures are negative, has received 5 days of empiric antibiotics, discontinued 1/14.  He has remained afebrile.   Essential hypertension  BP soft today, holding metoprolol. Started midodrine.   PAF, with RVR - BP low, resume midodrine,. Reduce metoprolol.    Chronic systolic CHF-EF 40-45%, global hypokinesis.  Cardiology consulted, GDMT is limited by noncompliance as well as intermittent hypotension.  Continue medical management   Hypokalemia, hypomagnesemia Replaced as needed  Hyponatremia -Resolved   Iron deficiency anemia -monitor hemoglobin -Started on ferrous sulfate  Estimated body mass index is 21.69 kg/m as calculated from the following:   Height as of this encounter: 5\' 8"  (1.727 m).   Weight as of this encounter: 64.7 kg.   DVT prophylaxis: Lovenox Code Status: Full code Family Communication: Disposition Plan:  Status is: Inpatient Remains inpatient appropriate because: management of confusion.     Consultants:  Psych   Procedures:    Antimicrobials:    Subjective: He has been more agitated.  Tangential speech.  Denies pain.   Objective: Vitals:   08/12/23 2041 08/13/23 0414 08/13/23 1035 08/13/23 1200  BP: 108/76 113/79 103/66 99/60  Pulse: 95 (!) 104 90 86  Resp: 18 14  20   Temp: 98 F (36.7 C) 97.8 F (36.6 C)  98.6 F (37 C)  TempSrc: Oral Oral  Oral  SpO2: 100% 100%  98%  Weight:      Height:        Intake/Output  Summary (Last 24 hours) at 08/13/2023 1634 Last data filed at 08/13/2023 1340 Gross per 24 hour  Intake 120 ml  Output 475 ml  Net -355 ml   Filed Weights   07/28/23 2159  Weight: 64.7 kg    Examination:  General exam:  NAD Respiratory system: CTA Cardiovascular system: S 1, S 2 IRR Central nervous system: Alert Extremities: no edema    Data Reviewed: I have personally reviewed following labs and imaging studies  CBC: Recent Labs  Lab 08/07/23 0500 08/09/23 0502 08/11/23 0401  WBC 3.8* 3.2* 4.4  HGB 10.5* 10.7* 10.6*  HCT 32.8* 34.5* 34.5*  MCV 99.1 101.8* 102.7*  PLT 236 263 235   Basic Metabolic Panel: Recent Labs  Lab 08/07/23 0500 08/09/23 0502 08/11/23 0401 08/12/23 0813 08/13/23 0347  NA 137 136 139  --   --   K 3.9 3.9 3.9  --   --   CL 105 106 107  --   --   CO2 25 23 23   --   --   GLUCOSE 81 81 89  --   --   BUN 15 28* 30*  --   --   CREATININE 0.58* 0.66 0.56*  --   --   CALCIUM 9.2 9.3 9.6  --   --   MG 1.4* 1.5* 1.6* 1.8 1.7  PHOS 4.2  --  4.2  --   --    GFR: Estimated Creatinine Clearance: 82 mL/min (A) (by C-G formula based on SCr of 0.56 mg/dL (L)). Liver Function Tests: Recent Labs  Lab 08/07/23 0500 08/11/23 0401  AST 26 35  ALT 17 24  ALKPHOS 90 86  BILITOT 0.6 0.5  PROT 6.8 7.3  ALBUMIN 3.0* 3.3*   No results for input(s): "LIPASE", "AMYLASE" in the last 168 hours. No results for input(s): "AMMONIA" in the last 168 hours. Coagulation Profile: No results for input(s): "INR", "PROTIME" in the last 168 hours. Cardiac Enzymes: No results for input(s): "CKTOTAL", "CKMB", "CKMBINDEX", "TROPONINI" in the last 168 hours. BNP (last 3 results) No results for input(s): "PROBNP" in the last 8760 hours. HbA1C: No results for input(s): "HGBA1C" in the last 72 hours. CBG: No results for input(s): "GLUCAP" in the last 168 hours. Lipid Profile: No results for input(s): "CHOL", "HDL", "LDLCALC", "TRIG", "CHOLHDL", "LDLDIRECT" in the last 72 hours. Thyroid Function Tests: No results for input(s): "TSH", "T4TOTAL", "FREET4", "T3FREE", "THYROIDAB" in the last 72 hours. Anemia Panel: No results for input(s): "VITAMINB12", "FOLATE", "FERRITIN", "TIBC", "IRON",  "RETICCTPCT" in the last 72 hours. Sepsis Labs: No results for input(s): "PROCALCITON", "LATICACIDVEN" in the last 168 hours.  No results found for this or any previous visit (from the past 240 hours).       Radiology Studies: No results found.       Scheduled Meds:  enoxaparin (LOVENOX) injection  65 mg Subcutaneous Q12H   feeding supplement  237 mL Oral BID BM   ferrous sulfate  325 mg Oral Q breakfast   folic acid  1 mg Oral Daily   magnesium oxide  200 mg Oral BID   metoprolol succinate  25 mg Oral Daily   midodrine  5 mg Oral TID WC   multivitamin with minerals  1 tablet Oral Daily   OLANZapine  2.5 mg Oral q12n4p   thiamine  100 mg Oral Daily   Or   thiamine  100 mg Intravenous Daily   vitamin B-12  500 mcg Oral Daily   Continuous Infusions:  LOS: 15 days    Time spent: 35 Minutes    Matti Minney A Raelea Gosse, MD [facility]   If 7PM-7AM, please contact night-coverage [email or web address]  08/13/2023, 4:34 PM

## 2023-08-13 NOTE — Progress Notes (Signed)
Physical Therapy Treatment Patient Details Name: Anthony A Wishon Jr. MRN: [id number] DOB: 1955-09-04 Today's Date: 08/13/2023   History of Present Illness Pt is 68 yo male admitted on 07/28/23 from homeless shelter due to delirium. Pt dx with acute metabolic encephalopathy, delirium, underlying EtOH use, fever unclear etiology, hypokalemia, and hyponatremia.  Pt with hx of recurrent PE, frequent withdrawals and delirium, PAF    PT Comments  Pt confused but cooperative with therapy today. He ambulated 120' with occasional use of rail and min A for balance.  Further therapy and distance limited due to HR elevated to 161 bpm Anthony [name] (150's consistently) with activity.  Quickly returned to 108 bpm with rest.  Pt remains unsteady and unsafe at this time.  Noting SNF not option per TOC, needs further therapy before able to return to shelter.     If plan is discharge home, recommend the following: A Godfrey help with walking and/or transfers;A [place] help with bathing/dressing/bathroom;Help with stairs or ramp for entrance;Supervision due to cognitive status   Can travel by private vehicle     Yes  Equipment Recommendations  Rolling walker (2 wheels)    Recommendations for Other Services       Precautions / Restrictions Precautions Precautions: Fall Precaution Comments: watch HR     Mobility  Anthony [name] Mobility Overal Anthony [name] mobility: Needs Assistance Anthony [name] Mobility: Supine to Sit, Sit to Supine     Supine to sit: Supervision Sit to supine: Supervision        Transfers Overall transfer level: Needs assistance Equipment used:  (occasional use handrail) Transfers: Sit to/from Stand Sit to Stand: Contact guard assist           General transfer comment: CGA for safety; performed x 3    Ambulation/Gait Ambulation/Gait assistance: Min assist Gait Distance (Feet): 120 Feet Assistive device:  (occasional use handrail) Gait Pattern/deviations: Step-through pattern, Drifts right/left, Trunk  flexed Gait velocity: decreased     General Gait Details: Cues for posture and focusing on straight gait pattern with floor tiles for guidance.  Pt having minor LOB requiring min A.  Distance limited due to elevated HR (161 bpm Anthony [name]).  Pt denies feeling fatigued, lightheaded, and like his heart is Anthony [name], petroleum Anthony [name]    Modified Rankin (Stroke Patients Only)       Balance Overall balance assessment: Needs assistance Sitting-balance support: Feet supported Sitting balance-Leahy Scale: Good     Standing balance support: Single extremity supported, No upper extremity supported Standing balance-Leahy Scale: Fair Standing balance comment: Using railing at times and min A at times                            Cognition Arousal: Alert Behavior During Therapy: WFL for tasks assessed/performed Overall Cognitive Status: No family/caregiver present to determine baseline cognitive functioning                                 General Comments: Pt was oriented to self, location, and day of the week.  He does have decreased safety awareness and recall.  Pt also making non-sensical statements.  Stated "that's 3 times you fooled me-" then referred to something about his IV, clothes, and walking locations - nothing that therapy had discussed  Exercises      General Comments        Pertinent Vitals/Pain Pain Assessment Pain Assessment: No/denies pain    Home Living                          Prior Function            PT Goals (current goals can now be found in the care plan section) Progress towards PT goals: Progressing toward goals    Frequency    Min 1X/week      PT Plan      Co-evaluation              AM-PAC PT "6 Clicks" Mobility   Outcome Measure  Help needed turning from your back to your side while in a flat Anthony [name] without using bedrails?: A Nazar Help needed moving  from lying on your back to sitting on the side of a flat Anthony [name] without using bedrails?: A Kohrs Help needed moving to and from a Anthony [name] to a chair (including a wheelchair)?: A Rochelle Help needed standing up from a chair using your arms (e.g., wheelchair or bedside chair)?: A Bratton Help needed to walk in hospital room?: A Hendley Help needed climbing 3-5 steps with a railing? : A Lot 6 Click Score: 17    End of Session Equipment Utilized During Treatment: Gait belt Activity Tolerance: Treatment limited secondary to medical complications (Comment) Patient left: in Anthony [name];with call bell/phone within reach;with Anthony [name] alarm set;with nursing/sitter in room;with restraints reapplied Nurse Communication: Mobility status PT Visit Diagnosis: Difficulty in walking, not elsewhere classified (R26.2);Unsteadiness on feet (R26.81)     Time: 1610-9604 PT Time Calculation (min) (ACUTE ONLY): 12 min  Charges:    $Gait Training: 8-22 mins PT General Charges $$ ACUTE PT VISIT: 1 Visit                     Anise Salvo, PT Acute Rehab [facility] [phone]    Rayetta Humphrey 08/13/2023, 3:46 PM

## 2023-08-13 NOTE — Progress Notes (Signed)
MD aware of patient's elevated HR. At 1055 patient's HR was in the 140's sustaining. RN had just given patient scheduled metoprolol when RN was notified by CCMD. Patient has PRN IV metoprolol for HR greater than 110. However, due to patient's soft BP, unable to give. Will continue to monitor and pass along to night shift RN.

## 2023-08-14 DIAGNOSIS — R410 Disorientation, unspecified: Secondary | ICD-10-CM | POA: Diagnosis not present

## 2023-08-14 LAB — BASIC METABOLIC PANEL
Anion gap: 9 (ref 5–15)
BUN: 27 mg/dL — ABNORMAL HIGH (ref 8–23)
CO2: 26 mmol/L (ref 22–32)
Calcium: 9.6 mg/dL (ref 8.9–10.3)
Chloride: 103 mmol/L (ref 98–111)
Creatinine, Ser: 0.61 mg/dL (ref 0.61–1.24)
GFR, Estimated: >60 mL/min (ref >60)
Glucose, Bld: 79 mg/dL (ref 70–99)
Potassium: 4.3 mmol/L (ref 3.5–5.1)
Sodium: 138 mmol/L (ref 135–145)

## 2023-08-14 LAB — MAGNESIUM: Magnesium: 1.7 mg/dL (ref 1.7–2.4)

## 2023-08-14 MED ORDER — ACETAMINOPHEN 325 MG PO TABS
650.0000 mg | ORAL_TABLET | Freq: Three times a day (TID) | ORAL | Status: DC
  Administered 2023-08-14 – 2023-08-27 (×36): 650 mg via ORAL
  Filled 2023-08-14 (×39): qty 2

## 2023-08-14 MED ORDER — MAGNESIUM SULFATE 2 GM/50ML IV SOLN
2.0000 g | Freq: Once | INTRAVENOUS | Status: AC
  Administered 2023-08-14: 2 g via INTRAVENOUS
  Filled 2023-08-14: qty 50

## 2023-08-14 MED ORDER — LORAZEPAM 2 MG/ML IJ SOLN: 1.0000 mg | Freq: Once | INTRAMUSCULAR | Status: DC
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
## 2023-08-14 NOTE — Plan of Care (Signed)
Problem: Coping: Goal: Level of anxiety will decrease Outcome: Progressing   Problem: Safety: Goal: Ability to remain free from injury will improve Outcome: Progressing   Problem: Skin Integrity: Goal: Risk for impaired skin integrity will decrease Outcome: Progressing

## 2023-08-14 NOTE — Progress Notes (Signed)
Mobility Specialist - Progress Note   During mobility: 100 bpm HR   08/14/23 1621  Mobility  Activity Ambulated with assistance in hallway  Level of Assistance Contact guard assist, steadying assist  Assistive Device Other (Comment) (IV Pole)  Distance Ambulated (ft) 700 ft  Range of Motion/Exercises Active  Mobility visit 1 Mobility  Mobility Specialist Start Time (ACUTE ONLY) 1600  Mobility Specialist Stop Time (ACUTE ONLY) 1621  Mobility Specialist Time Calculation (min) (ACUTE ONLY) 21 min   Pt was found dangling EOB. Assisted pt up and repeatedly asking to go to "the yard". Pt became agitated and at EOS threw IV pole. NT and I able to sit pt on recliner chair and pt was left in hallway with RN supervision.   Billey Chang Mobility Specialist

## 2023-08-14 NOTE — Progress Notes (Signed)
PROGRESS NOTE    Anthony A Latin Jr.  ZOX:[id number] DOB: 27-Sep-1955 DOA: 07/28/2023 PCP: Pcp, No   Brief Narrative: 68 year old with past medical history significant for alcohol use, frequent withdrawal and delirium in the past, PAF, recent PE not on anticoagulation due to homelessness and nonadherence to medical treatment presents to the hospital from shelter due to delirium.  Hospital course complicated by A-fib with RVR requiring diltiazem drip.  He was also found to have fever 100.6 on 1/10, since then fever has resolved.  Cultures negative.  Hospital course also complicated by persistent encephalopathy, etiology not entirely clear but suspect due to progressive cognitive issues in the setting of ongoing alcohol use.   Assessment & Plan:   Principal Problem:   Delirium Active Problems:   Alcohol abuse with alcohol-induced mood disorder (HCC)   Alcohol withdrawal delirium (HCC)   Agitation  Acute metabolic encephalopathy, delirium, underlying EtOH use -patient has having episodes like this in the past, noted several times during prior hospitalizations and thought to be in the setting of alcohol withdrawal/DTs.   -Thiamine levels normal, but has received empirical high-dose thiamine -Ammonia normal, B12 on the lower end, provide B12 injection x 1. Started oral supplement.  -MRI of the brain x 2 was negative for acute findings.  EEG did not show seizures or epileptiform discharges. -RPR negative -Psychiatry consulted, appreciate input. -Dr Elvera Lennox discussed with Dr. Amada Jupiter with neurology, case reviewed and images reviewed.  Given that he has been having intermittent confusions with prior hospitalizations, this may suggest an underlying neurodegenerative disorder possibly in the setting of longstanding EtOH use.  This is likely to have progressed and may be the main culprit here -SLP consult for cognitive eva score 23/28  suspect he will be unable to take care of himself, and given  homelessness disposition may be very difficult -More agitated, psych re-consulted. Zyprexa increase to 2.5 mg BID  Recurrent pulmonary embolism-currently on Lovenox as he is refusing oral medications intermittently.  Most recent PE was seen December 2024.  Remains hemodynamically stable   Fever-unclear etiology, cultures are negative, has received 5 days of empiric antibiotics, discontinued 1/14.  He has remained afebrile.   Essential hypertension  BP soft today, holding metoprolol. Started midodrine.   PAF, with RVR - BP low, resume midodrine,. Reduced metoprolol.   HR better today   Chronic systolic CHF-EF 40-45%, global hypokinesis.  Cardiology consulted, GDMT is limited by noncompliance as well as intermittent hypotension.  Continue medical management   Hypokalemia, hypomagnesemia Replaced as needed  Hyponatremia -Resolved   Iron deficiency anemia -monitor hemoglobin -Started on ferrous sulfate  Estimated body mass index is 21.69 kg/m as calculated from the following:   Height as of this encounter: 5\' 8"  (1.727 m).   Weight as of this encounter: 64.7 kg.   DVT prophylaxis: Lovenox Code Status: Full code Family Communication: Disposition Plan:  Status is: Inpatient Remains inpatient appropriate because: management of confusion.     Consultants:  Psych   Procedures:    Antimicrobials:    Subjective: No new complaints. Anthony [name]   Objective: Vitals:   08/13/23 1200 08/13/23 1814 08/13/23 2041 08/14/23 0628  BP: 99/60 107/87 103/74 116/89  Pulse: 86 (!) 107 (!) 101 89  Resp: 20 18 20    Temp: 98.6 F (37 C) 98 F (36.7 C) 97.7 F (36.5 C) 98.2 F (36.8 C)  TempSrc: Oral Oral Oral Oral  SpO2: 98% 99% 98% 100%  Weight:      Height:  Intake/Output Summary (Last 24 hours) at 08/14/2023 0733 Last data filed at 08/13/2023 2218 Gross per 24 hour  Intake 840 ml  Output 500 ml  Net 340 ml   Filed Weights   07/28/23 2159  Weight: 64.7 kg     Examination:  General exam: NAD Respiratory system: CTA Cardiovascular system: S1, S 2 RRR Central nervous system: Alert Extremities: no edema    Data Reviewed: I have personally reviewed following labs and imaging studies  CBC: Recent Labs  Lab 08/09/23 0502 08/11/23 0401  WBC 3.2* 4.4  HGB 10.7* 10.6*  HCT 34.5* 34.5*  MCV 101.8* 102.7*  PLT 263 235   Basic Metabolic Panel: Recent Labs  Lab 08/09/23 0502 08/11/23 0401 08/12/23 0813 08/13/23 0347 08/14/23 0440  NA 136 139  --   --   --   K 3.9 3.9  --   --   --   CL 106 107  --   --   --   CO2 23 23  --   --   --   GLUCOSE 81 89  --   --   --   BUN 28* 30*  --   --   --   CREATININE 0.66 0.56*  --   --   --   CALCIUM 9.3 9.6  --   --   --   MG 1.5* 1.6* 1.8 1.7 1.7  PHOS  --  4.2  --   --   --    GFR: Estimated Creatinine Clearance: 82 mL/min (A) (by C-G formula based on SCr of 0.56 mg/dL (L)). Liver Function Tests: Recent Labs  Lab 08/11/23 0401  AST 35  ALT 24  ALKPHOS 86  BILITOT 0.5  PROT 7.3  ALBUMIN 3.3*   No results for input(s): "LIPASE", "AMYLASE" in the last 168 hours. No results for input(s): "AMMONIA" in the last 168 hours. Coagulation Profile: No results for input(s): "INR", "PROTIME" in the last 168 hours. Cardiac Enzymes: No results for input(s): "CKTOTAL", "CKMB", "CKMBINDEX", "TROPONINI" in the last 168 hours. BNP (last 3 results) No results for input(s): "PROBNP" in the last 8760 hours. HbA1C: No results for input(s): "HGBA1C" in the last 72 hours. CBG: No results for input(s): "GLUCAP" in the last 168 hours. Lipid Profile: No results for input(s): "CHOL", "HDL", "LDLCALC", "TRIG", "CHOLHDL", "LDLDIRECT" in the last 72 hours. Thyroid Function Tests: No results for input(s): "TSH", "T4TOTAL", "FREET4", "T3FREE", "THYROIDAB" in the last 72 hours. Anemia Panel: No results for input(s): "VITAMINB12", "FOLATE", "FERRITIN", "TIBC", "IRON", "RETICCTPCT" in the last 72  hours. Sepsis Labs: No results for input(s): "PROCALCITON", "LATICACIDVEN" in the last 168 hours.  No results found for this or any previous visit (from the past 240 hours).       Radiology Studies: No results found.       Scheduled Meds:  acetaminophen  650 mg Oral TID   enoxaparin (LOVENOX) injection  65 mg Subcutaneous Q12H   feeding supplement  237 mL Oral BID BM   ferrous sulfate  325 mg Oral Q breakfast   folic acid  1 mg Oral Daily   magnesium oxide  200 mg Oral BID   metoprolol succinate  25 mg Oral Daily   midodrine  5 mg Oral TID WC   multivitamin with minerals  1 tablet Oral Daily   OLANZapine  2.5 mg Oral q12n4p   thiamine  100 mg Oral Daily   Or   thiamine  100 mg Intravenous Daily  vitamin B-12  500 mcg Oral Daily   Continuous Infusions:   LOS: 16 days    Time spent: 35 Minutes    Melquisedec Journey A Saxon Crosby, MD [facility]   If 7PM-7AM, please contact night-coverage [email or web address]  08/14/2023, 7:33 AM

## 2023-08-15 DIAGNOSIS — R410 Disorientation, unspecified: Secondary | ICD-10-CM | POA: Diagnosis not present

## 2023-08-15 LAB — MAGNESIUM: Magnesium: 1.9 mg/dL (ref 1.7–2.4)

## 2023-08-15 NOTE — Plan of Care (Signed)
  Problem: Education: Goal: Knowledge of General Education information will improve Description: Including pain rating scale, medication(s)/side effects and non-pharmacologic comfort measures Outcome: Progressing   Problem: Health Behavior/Discharge Planning: Goal: Ability to manage health-related needs will improve Outcome: Progressing   Problem: Clinical Measurements: Goal: Ability to maintain clinical measurements within normal limits will improve Outcome: Progressing Goal: Will remain free from infection Outcome: Progressing Goal: Diagnostic test results will improve Outcome: Progressing Goal: Respiratory complications will improve Outcome: Progressing Goal: Cardiovascular complication will be avoided Outcome: Progressing   Problem: Activity: Goal: Risk for activity intolerance will decrease Outcome: Progressing   Problem: Nutrition: Goal: Adequate nutrition will be maintained Outcome: Progressing   Problem: Coping: Goal: Level of anxiety will decrease Outcome: Progressing   Problem: Elimination: Goal: Will not experience complications related to bowel motility Outcome: Progressing Goal: Will not experience complications related to urinary retention Outcome: Progressing   Problem: Pain Management: Goal: General experience of comfort will improve Outcome: Progressing   Problem: Safety: Goal: Ability to remain free from injury will improve Outcome: Progressing   Problem: Skin Integrity: Goal: Risk for impaired skin integrity will decrease Outcome: Progressing   Problem: Safety: Goal: Violent Restraint(s) Outcome: Progressing   Problem: Safety: Goal: Non-violent Restraint(s) Outcome: Progressing

## 2023-08-15 NOTE — Progress Notes (Signed)
PROGRESS NOTE    Anthony A Mcgaugh Jr.  ZOX:[id number] DOB: 11-08-1955 DOA: 07/28/2023 PCP: Pcp, No   Brief Narrative: 68 year old with past medical history significant for alcohol use, frequent withdrawal and delirium in the past, PAF, recent PE not on anticoagulation due to homelessness and nonadherence to medical treatment presents to the hospital from shelter due to delirium.  Hospital course complicated by A-fib with RVR requiring diltiazem drip.  He was also found to have fever 100.6 on 1/10, since then fever has resolved.  Cultures negative.  Hospital course also complicated by persistent encephalopathy, etiology not entirely clear but suspect due to progressive cognitive issues in the setting of ongoing alcohol use.   Assessment & Plan:   Principal Problem:   Delirium Active Problems:   Alcohol abuse with alcohol-induced mood disorder (HCC)   Alcohol withdrawal delirium (HCC)   Agitation  Acute metabolic encephalopathy, delirium, underlying EtOH use -patient has having episodes like this in the past, noted several times during prior hospitalizations and thought to be in the setting of alcohol withdrawal/DTs.   -Thiamine levels normal, but has received empirical high-dose thiamine -Ammonia normal, B12 on the lower end, provide B12 injection x 1. Started oral supplement.  -MRI of the brain x 2 was negative for acute findings.  EEG did not show seizures or epileptiform discharges. -RPR negative -Psychiatry consulted, appreciate input. -Dr Elvera Lennox discussed with Dr. Amada Jupiter with neurology, case reviewed and images reviewed.  Given that he has been having intermittent confusions with prior hospitalizations, this may suggest an underlying neurodegenerative disorder possibly in the setting of longstanding EtOH use.  This is likely to have progressed and may be the main culprit here -SLP consult for cognitive eva score 23/28  suspect he will be unable to take care of himself, and given  homelessness disposition may be very difficult -1/24: More agitated, psych re-consulted. Zyprexa increase to 2.5 mg BID  Recurrent pulmonary embolism-currently on Lovenox as he is refusing oral medications intermittently.  Most recent PE was seen December 2024.  Remains hemodynamically stable   Fever-unclear etiology, cultures are negative, has received 5 days of empiric antibiotics, discontinued 1/14.  He has remained afebrile.   Essential hypertension  BP soft today, holding metoprolol. Started midodrine.   PAF, with RVR - BP low, resume midodrine,. Reduced metoprolol.  HR: 90  Chronic systolic CHF-EF 40-45%, global hypokinesis.  Cardiology consulted, GDMT is limited by noncompliance as well as intermittent hypotension.  Continue medical management   Hypokalemia, hypomagnesemia Replaced as needed  Hyponatremia -Resolved   Iron deficiency anemia -monitor hemoglobin -Started on ferrous sulfate  Estimated body mass index is 21.69 kg/m as calculated from the following:   Height as of this encounter: 5\' 8"  (1.727 m).   Weight as of this encounter: 64.7 kg.   DVT prophylaxis: Lovenox Code Status: Full code Family Communication: Disposition Plan:  Status is: Inpatient Remains inpatient appropriate because: management of confusion.     Consultants:  Psych   Procedures:    Antimicrobials:    Subjective: He is alert, confabulation.   Objective: Vitals:   08/14/23 1317 08/14/23 2204 08/15/23 0400 08/15/23 1314  BP: 93/68 96/61 108/78 111/73  Pulse: 91 80 88 97  Resp: 16 16 16 19   Temp: 98.5 F (36.9 C) 98.1 F (36.7 C) 97.8 F (36.6 C) 97.9 F (36.6 C)  TempSrc: Oral Oral Oral Oral  SpO2: 100% 97% 99% 100%  Weight:      Height:  Intake/Output Summary (Last 24 hours) at 08/15/2023 1611 Last data filed at 08/15/2023 1300 Gross per 24 hour  Intake 1320 ml  Output --  Net 1320 ml   Filed Weights   07/28/23 2159  Weight: 64.7 kg     Examination:  General exam: NAD Respiratory system: CTA Cardiovascular system: S 1, S 2 RRR Central nervous system: Alert, Non focal.  Extremities: No edema.     Data Reviewed: I have personally reviewed following labs and imaging studies  CBC: Recent Labs  Lab 08/09/23 0502 08/11/23 0401  WBC 3.2* 4.4  HGB 10.7* 10.6*  HCT 34.5* 34.5*  MCV 101.8* 102.7*  PLT 263 235   Basic Metabolic Panel: Recent Labs  Lab 08/09/23 0502 08/11/23 0401 08/12/23 0813 08/13/23 0347 08/14/23 0440 08/15/23 0427  NA 136 139  --   --  138  --   K 3.9 3.9  --   --  4.3  --   CL 106 107  --   --  103  --   CO2 23 23  --   --  26  --   GLUCOSE 81 89  --   --  79  --   BUN 28* 30*  --   --  27*  --   CREATININE 0.66 0.56*  --   --  0.61  --   CALCIUM 9.3 9.6  --   --  9.6  --   MG 1.5* 1.6* 1.8 1.7 1.7 1.9  PHOS  --  4.2  --   --   --   --    GFR: Estimated Creatinine Clearance: 82 mL/min (by C-G formula based on SCr of 0.61 mg/dL). Liver Function Tests: Recent Labs  Lab 08/11/23 0401  AST 35  ALT 24  ALKPHOS 86  BILITOT 0.5  PROT 7.3  ALBUMIN 3.3*   No results for input(s): "LIPASE", "AMYLASE" in the last 168 hours. No results for input(s): "AMMONIA" in the last 168 hours. Coagulation Profile: No results for input(s): "INR", "PROTIME" in the last 168 hours. Cardiac Enzymes: No results for input(s): "CKTOTAL", "CKMB", "CKMBINDEX", "TROPONINI" in the last 168 hours. BNP (last 3 results) No results for input(s): "PROBNP" in the last 8760 hours. HbA1C: No results for input(s): "HGBA1C" in the last 72 hours. CBG: No results for input(s): "GLUCAP" in the last 168 hours. Lipid Profile: No results for input(s): "CHOL", "HDL", "LDLCALC", "TRIG", "CHOLHDL", "LDLDIRECT" in the last 72 hours. Thyroid Function Tests: No results for input(s): "TSH", "T4TOTAL", "FREET4", "T3FREE", "THYROIDAB" in the last 72 hours. Anemia Panel: No results for input(s): "VITAMINB12", "FOLATE",  "FERRITIN", "TIBC", "IRON", "RETICCTPCT" in the last 72 hours. Sepsis Labs: No results for input(s): "PROCALCITON", "LATICACIDVEN" in the last 168 hours.  No results found for this or any previous visit (from the past 240 hours).       Radiology Studies: No results found.       Scheduled Meds:  acetaminophen  650 mg Oral TID   enoxaparin (LOVENOX) injection  65 mg Subcutaneous Q12H   feeding supplement  237 mL Oral BID BM   ferrous sulfate  325 mg Oral Q breakfast   folic acid  1 mg Oral Daily   LORazepam  1 mg Intramuscular Once   magnesium oxide  200 mg Oral BID   metoprolol succinate  25 mg Oral Daily   midodrine  5 mg Oral TID WC   multivitamin with minerals  1 tablet Oral Daily   OLANZapine  2.5  mg Oral q12n4p   thiamine  100 mg Oral Daily   Or   thiamine  100 mg Intravenous Daily   vitamin B-12  500 mcg Oral Daily   Continuous Infusions:   LOS: 17 days    Time spent: 35 Minutes    Dulcinea Kinser A Jamiria Langill, MD [facility]   If 7PM-7AM, please contact night-coverage [email or web address]  08/15/2023, 4:11 PM

## 2023-08-16 DIAGNOSIS — R410 Disorientation, unspecified: Secondary | ICD-10-CM | POA: Diagnosis not present

## 2023-08-16 NOTE — Progress Notes (Signed)
PT Cancellation Note  Patient Details Name: Anthony A Rando Jr. MRN: [id number] DOB: Nov 19, 1955   Cancelled Treatment:  Pt declined Physical Therapy. Pt frustrated and wanting to leave, putting on all his cloths and shoes.  "You going to stop me?' Attempted to redirect was unsuccessful.    Felecia Shelling  PTA Acute  Rehabilitation Services Office M-F          [phone]

## 2023-08-16 NOTE — Progress Notes (Signed)
PROGRESS NOTE    Jusitn A Boldman [name].  NFA:[id number] DOB: 02-Nov-1955 DOA: 07/28/2023 PCP: Pcp, No   Brief Narrative: 68 year old with past medical history significant for alcohol use, frequent withdrawal and delirium in the past, PAF, recent PE not on anticoagulation due to homelessness and nonadherence to medical treatment presents to the hospital from shelter due to delirium.  Hospital course complicated by A-fib with RVR requiring diltiazem drip.  He was also found to have fever 100.6 on 1/10, since then fever has resolved.  Cultures negative.  Hospital course also complicated by persistent encephalopathy, etiology not entirely clear but suspect due to progressive cognitive issues in the setting of ongoing alcohol use.   Assessment & Plan:   Principal Problem:   Delirium Active Problems:   Alcohol abuse with alcohol-induced mood disorder (HCC)   Alcohol withdrawal delirium (HCC)   Agitation  Acute metabolic encephalopathy, delirium, underlying EtOH use -patient has having episodes like this in the past, noted several times during prior hospitalizations and thought to be in the setting of alcohol withdrawal/DTs.   -Thiamine levels normal, but has received empirical high-dose thiamine. -Ammonia normal, B12 on the lower end, provide B12 injection x 1. Started oral supplement.  -MRI of the brain x 2 was negative for acute findings.  EEG did not show seizures or epileptiform discharges. -RPR negative. -Psychiatry consulted, appreciate input. -Dr Elvera Lennox discussed with Dr. Amada Jupiter with neurology, case reviewed and images reviewed.  Given that he has been having intermittent confusions with prior hospitalizations, this may suggest an underlying neurodegenerative disorder possibly in the setting of longstanding EtOH use.  This is likely to have progressed and may be the main culprit here -SLP consult for cognitive eva score 23/28  suspect he will be unable to take care of himself, and given  homelessness disposition may be very difficult -1/24: More agitated, psych re-consulted. Zyprexa increase to 2.5 mg BID Stable last 24 hours  Recurrent pulmonary embolism-currently on Lovenox as he is refusing oral medications intermittently.  Most recent PE was seen December 2024.  Remains hemodynamically stable   Fever-unclear etiology, cultures are negative, has received 5 days of empiric antibiotics, discontinued 1/14.  He has remained afebrile.   Essential hypertension On low dose metoprolol.  Has recurrent Hypotension. Midodrine resume.   PAF, with RVR - BP low, resume midodrine,. Reduced metoprolol.  HR: 90  Chronic systolic CHF-EF 40-45%, global hypokinesis.  Cardiology consulted, GDMT is limited by noncompliance as well as intermittent hypotension.  Continue medical management   Hypokalemia, hypomagnesemia Replaced as needed  Hyponatremia -Resolved   Iron deficiency anemia -monitor hemoglobin -Started on ferrous sulfate  Estimated body mass index is 21.69 kg/m as calculated from the following:   Height as of this encounter: 5\' 8"  (1.727 m).   Weight as of this encounter: 64.7 kg.   DVT prophylaxis: Lovenox Code Status: Full code Family Communication: Disposition Plan:  Status is: Inpatient Remains inpatient appropriate because: management of confusion.     Consultants:  Psych   Procedures:    Antimicrobials:    Subjective: Alert, no new complaint. Confuse  Objective: Vitals:   08/15/23 2149 08/16/23 0525 08/16/23 0832 08/16/23 1253  BP: 98/71 97/72 115/74 107/69  Pulse: 96 97 86 96  Resp: 18 18    Temp: 98 F (36.7 C) 97.6 F (36.4 C)    TempSrc: Oral Oral    SpO2: 98% 97%    Weight:      Height:  Intake/Output Summary (Last 24 hours) at 08/16/2023 1629 Last data filed at 08/16/2023 1330 Gross per 24 hour  Intake 480 ml  Output --  Net 480 ml   Filed Weights   07/28/23 2159  Weight: 64.7 kg    Examination:  General exam:  NAD Respiratory system: CTA Cardiovascular system: S 1, S 2 RRRR Central nervous system: Alert Extremities: No edema.     Data Reviewed: I have personally reviewed following labs and imaging studies  CBC: Recent Labs  Lab 08/11/23 0401  WBC 4.4  HGB 10.6*  HCT 34.5*  MCV 102.7*  PLT 235   Basic Metabolic Panel: Recent Labs  Lab 08/11/23 0401 08/12/23 0813 08/13/23 0347 08/14/23 0440 08/15/23 0427  NA 139  --   --  138  --   K 3.9  --   --  4.3  --   CL 107  --   --  103  --   CO2 23  --   --  26  --   GLUCOSE 89  --   --  79  --   BUN 30*  --   --  27*  --   CREATININE 0.56*  --   --  0.61  --   CALCIUM 9.6  --   --  9.6  --   MG 1.6* 1.8 1.7 1.7 1.9  PHOS 4.2  --   --   --   --    GFR: Estimated Creatinine Clearance: 82 mL/min (by C-G formula based on SCr of 0.61 mg/dL). Liver Function Tests: Recent Labs  Lab 08/11/23 0401  AST 35  ALT 24  ALKPHOS 86  BILITOT 0.5  PROT 7.3  ALBUMIN 3.3*   No results for input(s): "LIPASE", "AMYLASE" in the last 168 hours. No results for input(s): "AMMONIA" in the last 168 hours. Coagulation Profile: No results for input(s): "INR", "PROTIME" in the last 168 hours. Cardiac Enzymes: No results for input(s): "CKTOTAL", "CKMB", "CKMBINDEX", "TROPONINI" in the last 168 hours. BNP (last 3 results) No results for input(s): "PROBNP" in the last 8760 hours. HbA1C: No results for input(s): "HGBA1C" in the last 72 hours. CBG: No results for input(s): "GLUCAP" in the last 168 hours. Lipid Profile: No results for input(s): "CHOL", "HDL", "LDLCALC", "TRIG", "CHOLHDL", "LDLDIRECT" in the last 72 hours. Thyroid Function Tests: No results for input(s): "TSH", "T4TOTAL", "FREET4", "T3FREE", "THYROIDAB" in the last 72 hours. Anemia Panel: No results for input(s): "VITAMINB12", "FOLATE", "FERRITIN", "TIBC", "IRON", "RETICCTPCT" in the last 72 hours. Sepsis Labs: No results for input(s): "PROCALCITON", "LATICACIDVEN" in the last 168  hours.  No results found for this or any previous visit (from the past 240 hours).       Radiology Studies: No results found.       Scheduled Meds:  acetaminophen  650 mg Oral TID   enoxaparin (LOVENOX) injection  65 mg Subcutaneous Q12H   feeding supplement  237 mL Oral BID BM   ferrous sulfate  325 mg Oral Q breakfast   folic acid  1 mg Oral Daily   LORazepam  1 mg Intramuscular Once   magnesium oxide  200 mg Oral BID   metoprolol succinate  25 mg Oral Daily   midodrine  5 mg Oral TID WC   multivitamin with minerals  1 tablet Oral Daily   OLANZapine  2.5 mg Oral q12n4p   thiamine  100 mg Oral Daily   Or   thiamine  100 mg Intravenous Daily  vitamin B-12  500 mcg Oral Daily   Continuous Infusions:   LOS: 18 days    Time spent: 35 Minutes    Majestic Molony A Tyheem Boughner, MD [facility]   If 7PM-7AM, please contact night-coverage [email or web address]  08/16/2023, 4:29 PM

## 2023-08-17 DIAGNOSIS — R410 Disorientation, unspecified: Secondary | ICD-10-CM | POA: Diagnosis not present

## 2023-08-17 NOTE — Progress Notes (Signed)
PROGRESS NOTE    Anthony A Koopmann Jr.  MVH:[id number] DOB: 1955/12/25 DOA: 07/28/2023 PCP: Pcp, No   Brief Narrative: 68 year old with past medical history significant for alcohol use, frequent withdrawal and delirium in the past, PAF, recent PE not on anticoagulation due to homelessness and nonadherence to medical treatment presents to the hospital from shelter due to delirium.  Hospital course complicated by A-fib with RVR requiring diltiazem drip.  He was also found to have fever 100.6 on 1/10, since then fever has resolved.  Cultures negative.  Hospital course also complicated by persistent encephalopathy, etiology not entirely clear but suspect due to progressive cognitive issues in the setting of ongoing alcohol use.  Difficult disposition, patient with encephalopathy, concern for dementia, unable to care for himself.  Need placement.  Social worker following  Assessment & Plan:   Principal Problem:   Delirium Active Problems:   Alcohol abuse with alcohol-induced mood disorder (HCC)   Alcohol withdrawal delirium (HCC)   Agitation  Acute metabolic encephalopathy, delirium, underlying EtOH use -Concern for Dementia.  -Patient has having episodes like this in the past, noted several times during prior hospitalizations and thought to be in the setting of alcohol withdrawal/DTs.   -Thiamine levels normal, but has received empirical high-dose thiamine. -Ammonia normal, B12 on the lower end, provide B12 injection x 1. Started oral supplement.  -MRI of the brain x 2 was negative for acute findings.  EEG did not show seizures or epileptiform discharges. -RPR negative. -Psychiatry consulted, appreciate input. -Dr Elvera Lennox discussed with Dr. Amada Jupiter with neurology, case reviewed and images reviewed.  Given that he has been having intermittent confusions with prior hospitalizations, this may suggest an underlying neurodegenerative disorder possibly in the setting of longstanding EtOH use.  This is  likely to have progressed and may be the main culprit here -SLP consult for cognitive eva score 23/28  suspect he will be unable to take care of himself, and given homelessness disposition may be very difficult -1/24: More agitated, psych re-consulted. Zyprexa increase to 2.5 mg BID Stable last 48 hours  Recurrent pulmonary embolism-currently on Lovenox as he is refusing oral medications intermittently.  Most recent PE was seen December 2024.  Remains hemodynamically stable   Fever-unclear etiology, cultures are negative, has received 5 days of empiric antibiotics, discontinued 1/14.  He has remained afebrile.   Essential hypertension On low dose metoprolol.  Has recurrent Hypotension. Midodrine resume.   PAF, with RVR - BP low, resume midodrine,. Reduced metoprolol.  HR: 90  Chronic systolic CHF-EF 40-45%, global hypokinesis.  Cardiology consulted, GDMT is limited by noncompliance as well as intermittent hypotension.  Continue medical management   Hypokalemia, hypomagnesemia Replaced as needed  Hyponatremia -Resolved   Iron deficiency anemia -monitor hemoglobin -Started on ferrous sulfate  Estimated body mass index is 21.69 kg/m as calculated from the following:   Height as of this encounter: 5\' 8"  (1.727 m).   Weight as of this encounter: 64.7 kg.   DVT prophylaxis: Lovenox Code Status: Full code Family Communication: Disposition Plan:  Status is: Inpatient Remains inpatient appropriate because: management of confusion.     Consultants:  Psych   Procedures:    Antimicrobials:    Subjective: Alert, confabulation, tangential speech  Denies pain   Objective: Vitals:   08/16/23 1740 08/16/23 2109 08/17/23 0310 08/17/23 1038  BP: 104/72 108/79 107/74 107/74  Pulse: 88 77 96 96  Resp:  18 19   Temp:  98.6 F (37 C) 98.2 F (36.8 C)  TempSrc:  Oral Oral   SpO2:  99% 95%   Weight:      Height:        Intake/Output Summary (Last 24 hours) at 08/17/2023  1558 Last data filed at 08/17/2023 1300 Gross per 24 hour  Intake 720 ml  Output 400 ml  Net 320 ml   Filed Weights   07/28/23 2159  Weight: 64.7 kg    Examination:  General exam: NAD Respiratory system: CTA Cardiovascular system: S 1, S  2 RRR Central nervous system: Alert Extremities: No edema.     Data Reviewed: I have personally reviewed following labs and imaging studies  CBC: Recent Labs  Lab 08/11/23 0401  WBC 4.4  HGB 10.6*  HCT 34.5*  MCV 102.7*  PLT 235   Basic Metabolic Panel: Recent Labs  Lab 08/11/23 0401 08/12/23 0813 08/13/23 0347 08/14/23 0440 08/15/23 0427  NA 139  --   --  138  --   K 3.9  --   --  4.3  --   CL 107  --   --  103  --   CO2 23  --   --  26  --   GLUCOSE 89  --   --  79  --   BUN 30*  --   --  27*  --   CREATININE 0.56*  --   --  0.61  --   CALCIUM 9.6  --   --  9.6  --   MG 1.6* 1.8 1.7 1.7 1.9  PHOS 4.2  --   --   --   --    GFR: Estimated Creatinine Clearance: 82 mL/min (by C-G formula based on SCr of 0.61 mg/dL). Liver Function Tests: Recent Labs  Lab 08/11/23 0401  AST 35  ALT 24  ALKPHOS 86  BILITOT 0.5  PROT 7.3  ALBUMIN 3.3*   No results for input(s): "LIPASE", "AMYLASE" in the last 168 hours. No results for input(s): "AMMONIA" in the last 168 hours. Coagulation Profile: No results for input(s): "INR", "PROTIME" in the last 168 hours. Cardiac Enzymes: No results for input(s): "CKTOTAL", "CKMB", "CKMBINDEX", "TROPONINI" in the last 168 hours. BNP (last 3 results) No results for input(s): "PROBNP" in the last 8760 hours. HbA1C: No results for input(s): "HGBA1C" in the last 72 hours. CBG: No results for input(s): "GLUCAP" in the last 168 hours. Lipid Profile: No results for input(s): "CHOL", "HDL", "LDLCALC", "TRIG", "CHOLHDL", "LDLDIRECT" in the last 72 hours. Thyroid Function Tests: No results for input(s): "TSH", "T4TOTAL", "FREET4", "T3FREE", "THYROIDAB" in the last 72 hours. Anemia Panel: No  results for input(s): "VITAMINB12", "FOLATE", "FERRITIN", "TIBC", "IRON", "RETICCTPCT" in the last 72 hours. Sepsis Labs: No results for input(s): "PROCALCITON", "LATICACIDVEN" in the last 168 hours.  No results found for this or any previous visit (from the past 240 hours).       Radiology Studies: No results found.       Scheduled Meds:  acetaminophen  650 mg Oral TID   enoxaparin (LOVENOX) injection  65 mg Subcutaneous Q12H   feeding supplement  237 mL Oral BID BM   ferrous sulfate  325 mg Oral Q breakfast   folic acid  1 mg Oral Daily   LORazepam  1 mg Intramuscular Once   magnesium oxide  200 mg Oral BID   metoprolol succinate  25 mg Oral Daily   midodrine  5 mg Oral TID WC   multivitamin with minerals  1 tablet Oral Daily  OLANZapine  2.5 mg Oral q12n4p   thiamine  100 mg Oral Daily   Or   thiamine  100 mg Intravenous Daily   vitamin B-12  500 mcg Oral Daily   Continuous Infusions:   LOS: 19 days    Time spent: 35 Minutes    Anthony Rison A Abrea Henle, MD [facility]   If 7PM-7AM, please contact night-coverage [email or web address]  08/17/2023, 3:58 PM

## 2023-08-17 NOTE — Progress Notes (Signed)
Physical Therapy Treatment Patient Details Name: Anthony A Mcbee Jr. MRN: [id number] DOB: 14-Sep-1955 Today's Date: 08/17/2023   History of Present Illness Pt is 68 yo male admitted on 07/28/23 from homeless shelter due to delirium. Pt dx with acute metabolic encephalopathy, delirium, underlying EtOH use, fever unclear etiology, hypokalemia, and hyponatremia.  Pt with hx of recurrent PE, frequent withdrawals and delirium, PAF    PT Comments  Pt cooperative today with incr time and encouragement. progressing well and meeting goals.  amb 380' without device or overt LOB, mild drifting however no physical assist needed. Pt amb to bathroom independently; safety issues remain d/t cognitive status  PT signing off at this time. We are available if pt status should change plese re-consult.   recommend pt continue to mobilize with staff/mobility team.   If plan is discharge home, recommend the following: Supervision due to cognitive status   Can travel by private vehicle        Equipment Recommendations  None recommended by PT    Recommendations for Other Services       Precautions / Restrictions Precautions Precautions: Fall Precaution Comments: watch HR Restrictions Weight Bearing Restrictions Per Provider Order: No     Mobility  Bed Mobility Overal bed mobility: Modified Independent Bed Mobility: Supine to Sit, Sit to Supine           General bed mobility comments: no physical assist    Transfers   Equipment used: None Transfers: Sit to/from Stand Sit to Stand: Supervision, Modified independent (Device/Increase time)           General transfer comment: no physical assist. steady on standing    Ambulation/Gait Ambulation/Gait assistance: Supervision, Contact guard assist Gait Distance (Feet): 380 Feet Assistive device: None Gait Pattern/deviations: Step-through pattern, Drifts right/left Gait velocity: decr but functional     General Gait Details: slight drifting  but no overt LOB while holding therapist's hand   Stairs             Wheelchair Mobility     Tilt Bed    Modified Rankin (Stroke Patients Only)       Balance           Standing balance support: No upper extremity supported Standing balance-Leahy Scale: Fair Standing balance comment: Fair +; pt able to amb without device, NT to moderate challenges, no LOB with head turns; able to stand for toileting, wash hands  at sink without assist                            Cognition Arousal: Alert Behavior During Therapy: [facility] for tasks assessed/performed Overall Cognitive Status: No family/caregiver present to determine baseline cognitive functioning                         Following Commands: Follows one step commands with increased time     Problem Solving: Requires verbal cues General Comments: Pt was oriented to self, location, and day of the week.  He does have decreased safety awareness and recall.  Pt also making non-sensical statements.  requires redirection to task/participation with PT        Exercises      General Comments        Pertinent Vitals/Pain Pain Assessment Pain Assessment: No/denies pain    Home Living  Prior Function            PT Goals (current goals can now be found in the care plan section) Acute Rehab PT Goals Patient Stated Goal: Find somewhere to live and not fall PT Goal Formulation: Patient unable to participate in goal setting Progress towards PT goals: Goals met/education completed, patient discharged from PT    Frequency           PT Plan      Co-evaluation              AM-PAC PT "6 Clicks" Mobility   Outcome Measure  Help needed turning from your back to your side while in a flat bed without using bedrails?: None Help needed moving from lying on your back to sitting on the side of a flat bed without using bedrails?: None Help needed moving to and  from a bed to a chair (including a wheelchair)?: None Help needed standing up from a chair using your arms (e.g., wheelchair or bedside chair)?: None Help needed to walk in hospital room?: None Help needed climbing 3-5 steps with a railing? : A Yam 6 Click Score: 23    End of Session   Activity Tolerance: Patient tolerated treatment well Patient left: in bed;with call bell/phone within reach;with bed alarm set Nurse Communication: Mobility status PT Visit Diagnosis: Difficulty in walking, not elsewhere classified (R26.2);Unsteadiness on feet (R26.81)     Time: 3474-2595 PT Time Calculation (min) (ACUTE ONLY): 22 min  Charges:    $Gait Training: 8-22 mins PT General Charges $$ ACUTE PT VISIT: 1 Visit                     Taylon Coole, PT  Acute Rehab Dept [facility]) [phone]  08/17/2023    [facility] 08/17/2023, 4:25 PM

## 2023-08-17 NOTE — Plan of Care (Signed)
  Problem: Education: Goal: Knowledge of General Education information will improve Description: Including pain rating scale, medication(s)/side effects and non-pharmacologic comfort measures Outcome: Progressing   Problem: Health Behavior/Discharge Planning: Goal: Ability to manage health-related needs will improve Outcome: Progressing   Problem: Clinical Measurements: Goal: Ability to maintain clinical measurements within normal limits will improve Outcome: Progressing Goal: Will remain free from infection Outcome: Progressing Goal: Diagnostic test results will improve Outcome: Progressing Goal: Respiratory complications will improve Outcome: Progressing Goal: Cardiovascular complication will be avoided Outcome: Progressing   Problem: Activity: Goal: Risk for activity intolerance will decrease Outcome: Progressing   Problem: Nutrition: Goal: Adequate nutrition will be maintained Outcome: Progressing   Problem: Coping: Goal: Level of anxiety will decrease Outcome: Progressing   Problem: Elimination: Goal: Will not experience complications related to bowel motility Outcome: Progressing Goal: Will not experience complications related to urinary retention Outcome: Progressing   Problem: Pain Management: Goal: General experience of comfort will improve Outcome: Progressing   Problem: Safety: Goal: Ability to remain free from injury will improve Outcome: Progressing   Problem: Skin Integrity: Goal: Risk for impaired skin integrity will decrease Outcome: Progressing   Problem: Safety: Goal: Violent Restraint(s) Outcome: Progressing   Problem: Safety: Goal: Non-violent Restraint(s) Outcome: Progressing

## 2023-08-17 NOTE — Progress Notes (Signed)
Occupational Therapy Treatment Patient Details Name: Anthony A Mceuen Jr. MRN: [id number] DOB: [birth year] Today's Date: 08/17/2023   History of present illness Pt is 68 yo male admitted on 07/28/23 from homeless shelter due to delirium. Pt dx with acute metabolic encephalopathy, delirium, underlying EtOH use, fever unclear etiology, hypokalemia, and hyponatremia.  Pt with hx of recurrent PE, frequent withdrawals and delirium, PAF   OT comments  Pt continues to make progress with functional goals. Pt pleasant, cooperative and jovial this session. OT will continue to follow acutely to maximize level of function and safety      If plan is discharge home, recommend the following:  A Gilberti help with walking and/or transfers;A Tait help with bathing/dressing/bathroom;Assistance with cooking/housework;Assist for transportation;Supervision due to cognitive status;Direct supervision/assist for medications management   Equipment Recommendations       Recommendations for Other Services      Precautions / Restrictions Precautions Precautions: Fall Precaution Comments: watch HR Restrictions Weight Bearing Restrictions Per Provider Order: No       Mobility Bed Mobility Overal bed mobility: Needs Assistance Bed Mobility: Supine to Sit, Sit to Supine     Supine to sit: Supervision Sit to supine: Supervision        Transfers Overall transfer level: Needs assistance   Transfers: Sit to/from Stand Sit to Stand: Contact guard assist                 Balance Overall balance assessment: Needs assistance Sitting-balance support: Feet supported Sitting balance-Leahy Scale: Good     Standing balance support: Single extremity supported, No upper extremity supported, During functional activity Standing balance-Leahy Scale: Fair                             ADL either performed or assessed with clinical judgement   ADL Overall ADL's : Needs assistance/impaired      Grooming: Wash/dry hands;Wash/dry face;Brushing hair;Supervision/safety;Standing           Upper Body Dressing : Supervision/safety;Standing     Lower Body Dressing Details (indicate cue type and reason): donning socks Toilet Transfer: Contact guard assist;Regular Toilet;Cueing for safety   Toileting- Clothing Manipulation and Hygiene: Supervision/safety;Sit to/from stand       Functional mobility during ADLs: Contact guard assist General ADL Comments: pt alert and very talkative. Required repeated, multilpe verbal cues and prompts to wash face and hands, comb hair while standing at sink    Extremity/Trunk Assessment Upper Extremity Assessment Upper Extremity Assessment: Overall WFL for tasks assessed   Lower Extremity Assessment Lower Extremity Assessment: Defer to PT evaluation        Vision Baseline Vision/History: 1 Wears glasses Ability to See in Adequate Light: 0 Adequate Patient Visual Report: No change from baseline     Perception     Praxis      Cognition Arousal: Alert Behavior During Therapy: WFL for tasks assessed/performed Overall Cognitive Status: No family/caregiver present to determine baseline cognitive functioning Area of Impairment: Following commands, Problem solving, Memory, Attention, Awareness                       Following Commands: Follows one step commands with increased time Safety/Judgement: Decreased awareness of deficits, Decreased awareness of safety              Exercises      Shoulder Instructions       General Comments      Pertinent  Vitals/ Pain       Pain Assessment Pain Assessment: No/denies pain Pain Score: 0-No pain Pain Intervention(s): Monitored during session, Repositioned  Home Living                                          Prior Functioning/Environment              Frequency  Min 1X/week        Progress Toward Goals  OT Goals(current goals can now be found  in the care plan section)  Progress towards OT goals: Progressing toward goals     Plan      Co-evaluation                 AM-PAC OT "6 Clicks" Daily Activity     Outcome Measure   Help from another person eating meals?: None Help from another person taking care of personal grooming?: A Binegar Help from another person toileting, which includes using toliet, bedpan, or urinal?: A Chatwin Help from another person bathing (including washing, rinsing, drying)?: A Leber Help from another person to put on and taking off regular upper body clothing?: A Letendre Help from another person to put on and taking off regular lower body clothing?: A Rybacki 6 Click Score: 19    End of Session Equipment Utilized During Treatment: Gait belt  OT Visit Diagnosis: Unsteadiness on feet (R26.81);Muscle weakness (generalized) (M62.81);Other symptoms and signs involving cognitive function   Activity Tolerance Patient tolerated treatment well   Patient Left in bed;with call bell/phone within reach   Nurse Communication Mobility status        Time: 9562-1308 OT Time Calculation (min): 12 min  Charges: OT General Charges $OT Visit: 1 Visit OT Treatments $Self Care/Home Management : 8-22 mins    Galen Manila 08/17/2023, 2:23 PM

## 2023-08-18 DIAGNOSIS — R410 Disorientation, unspecified: Secondary | ICD-10-CM | POA: Diagnosis not present

## 2023-08-18 LAB — BASIC METABOLIC PANEL
Anion gap: 9 (ref 5–15)
BUN: 27 mg/dL — ABNORMAL HIGH (ref 8–23)
CO2: 21 mmol/L — ABNORMAL LOW (ref 22–32)
Calcium: 9.1 mg/dL (ref 8.9–10.3)
Chloride: 106 mmol/L (ref 98–111)
Creatinine, Ser: 0.33 mg/dL — ABNORMAL LOW (ref 0.61–1.24)
GFR, Estimated: >60 mL/min (ref >60)
Glucose, Bld: 107 mg/dL — ABNORMAL HIGH (ref 70–99)
Potassium: 3.9 mmol/L (ref 3.5–5.1)
Sodium: 136 mmol/L (ref 135–145)

## 2023-08-18 LAB — CBC
HCT: 30.8 % — ABNORMAL LOW (ref 39.0–52.0)
Hemoglobin: 9.5 g/dL — ABNORMAL LOW (ref 13.0–17.0)
MCH: 32.1 pg (ref 26.0–34.0)
MCHC: 30.8 g/dL (ref 30.0–36.0)
MCV: 104.1 fL — ABNORMAL HIGH (ref 80.0–100.0)
Platelets: 248 10*3/uL (ref 150–400)
RBC: 2.96 MIL/uL — ABNORMAL LOW (ref 4.22–5.81)
RDW: 16.7 % — ABNORMAL HIGH (ref 11.5–15.5)
WBC: 3.8 10*3/uL — ABNORMAL LOW (ref 4.0–10.5)
nRBC: 0 % (ref 0.0–0.2)

## 2023-08-18 LAB — GLUCOSE, CAPILLARY: Glucose-Capillary: 111 mg/dL — ABNORMAL HIGH (ref 70–99)

## 2023-08-18 MED ORDER — ORAL CARE MOUTH RINSE: 15.0000 mL | OROMUCOSAL | Status: DC | PRN

## 2023-08-18 NOTE — Progress Notes (Signed)
Progress Note   Patient: Anthony A Kari Jr. WJX:[id number] DOB: 06/18/1956 DOA: 07/28/2023     20 DOS: the patient was seen and examined on 08/18/2023   Brief hospital course: 68 year old with past medical history significant for alcohol use, frequent withdrawal and delirium in the past, PAF, recent PE not on anticoagulation due to homelessness and nonadherence to medical treatment presents to the hospital from shelter due to delirium.  Hospital course complicated by A-fib with RVR requiring diltiazem drip.  He was also found to have fever 100.6 on 1/10, since then fever has resolved.  Cultures negative.  Hospital course also complicated by persistent encephalopathy, etiology not entirely clear but suspect due to progressive cognitive issues in the setting of ongoing alcohol use.  Assessment and Plan: Acute metabolic encephalopathy, delirium, underlying EtOH use -Concern for Dementia.  -Patient has having episodes like this in the past, noted several times during prior hospitalizations and thought to be in the setting of alcohol withdrawal/DTs.   -Thiamine levels normal, but has received empirical high-dose thiamine. -Ammonia normal, B12 on the lower end, provide B12 injection x 1. Started oral supplement.  -MRI of the brain x 2 was negative for acute findings.  EEG did not show seizures or epileptiform discharges. -RPR negative. -Psychiatry consulted, appreciate input. -Dr Elvera Lennox discussed with Dr. Amada Jupiter with neurology, case reviewed and images reviewed.  Given that he has been having intermittent confusions with prior hospitalizations, this may suggest an underlying neurodegenerative disorder possibly in the setting of longstanding EtOH use.  This is likely to have progressed and may be the main culprit here -SLP consult for cognitive eva score 23/28  suspect he will be unable to take care of himself, and given homelessness disposition may be very difficult -1/24: More agitated, psych  re-consulted. Zyprexa increase to 2.5 mg BID Stable last 72 hours   Recurrent pulmonary embolism-currently on Lovenox as he is refusing oral medications intermittently.  Most recent PE was seen December 2024.  Remains hemodynamically stable   Fever-unclear etiology, cultures are negative, has received 5 days of empiric antibiotics, discontinued 1/14.  He has remained afebrile.   Essential hypertension On low dose metoprolol.  Has recurrent Hypotension. Midodrine resume.    PAF, with RVR - BP low, resume midodrine,. Reduced metoprolol.  HR: 90   Chronic systolic CHF-EF 40-45%, global hypokinesis.  Cardiology consulted, GDMT is limited by noncompliance as well as intermittent hypotension.  Continue medical management   Hypokalemia, hypomagnesemia   Hyponatremia -Resolved   Iron deficiency anemia -monitor hemoglobin -Started on ferrous sulfate       Subjective:  Feels ok  Physical Exam: Vitals:   08/18/23 0302 08/18/23 0303 08/18/23 0937 08/18/23 1304  BP: 95/70 101/68 101/68 104/73  Pulse: 96 93  100  Resp: 17   14  Temp: 98.2 F (36.8 C)   98 F (36.7 C)  TempSrc: Oral   Oral  SpO2: 98% 99%  93%  Weight:      Height:       Physical Exam Vitals reviewed.  Constitutional:      General: He is not in acute distress.    Appearance: He is not ill-appearing or toxic-appearing.  Cardiovascular:     Rate and Rhythm: Normal rate and regular rhythm.     Heart sounds: No murmur heard. Pulmonary:     Effort: Pulmonary effort is normal. No respiratory distress.     Breath sounds: No wheezing, rhonchi or rales.  Neurological:     Mental  Status: He is alert.  Psychiatric:        Behavior: Behavior normal.     Data Reviewed: BMP noted Hgb stable  Family Communication: none  Disposition: Status is: Inpatient Remains inpatient appropriate because: await disposition     Time spent: 20 minutes  Author: Brendia Sacks, MD 08/18/2023 6:25 PM  For on call  review [email or web address].

## 2023-08-19 DIAGNOSIS — F1014 Alcohol abuse with alcohol-induced mood disorder: Secondary | ICD-10-CM | POA: Diagnosis not present

## 2023-08-19 DIAGNOSIS — R410 Disorientation, unspecified: Secondary | ICD-10-CM | POA: Diagnosis not present

## 2023-08-19 MED ORDER — APIXABAN 5 MG PO TABS
5.0000 mg | ORAL_TABLET | Freq: Two times a day (BID) | ORAL | Status: DC
  Administered 2023-08-19 – 2023-08-27 (×16): 5 mg via ORAL
  Filled 2023-08-19 (×16): qty 1

## 2023-08-19 NOTE — Progress Notes (Signed)
Progress Note   Patient: Anthony A Vanes Jr. WJX:[id number] DOB: November 14, 1955 DOA: 07/28/2023     21 DOS: the patient was seen and examined on 08/19/2023   Brief hospital course: 68 year old with past medical history significant for alcohol use, frequent withdrawal and delirium in the past, PAF, recent PE not on anticoagulation due to homelessness and nonadherence to medical treatment presents to the hospital from shelter due to delirium.  Hospital course complicated by A-fib with RVR requiring diltiazem drip.  He was also found to have fever 100.6 on 1/10, since then fever has resolved.  Cultures negative.  Hospital course also complicated by persistent encephalopathy, etiology not entirely clear but suspect due to progressive cognitive issues in the setting of ongoing alcohol use.  Assessment and Plan: Acute metabolic encephalopathy, delirium, underlying EtOH use -Concern for Dementia.  -Patient has having episodes like this in the past, noted several times during prior hospitalizations and thought to be in the setting of alcohol withdrawal/DTs.   -Thiamine levels normal, but has received empirical high-dose thiamine. -Ammonia normal, B12 on the lower end, provide B12 injection x 1. Started oral supplement.  -MRI of the brain x 2 was negative for acute findings.  EEG did not show seizures or epileptiform discharges. -RPR negative. -Psychiatry consulted, appreciate input. -Dr Elvera Lennox discussed with Dr. Amada Jupiter with neurology, case reviewed and images reviewed.  Given that he has been having intermittent confusions with prior hospitalizations, this may suggest an underlying neurodegenerative disorder possibly in the setting of longstanding EtOH use.  This is likely to have progressed and may be the main culprit here -SLP consult for cognitive eva score 23/28  suspect he will be unable to take care of himself, and given homelessness disposition may be very difficult -1/24: More agitated, psych  re-consulted. Zyprexa increase to 2.5 mg BID Stable last 96 hours   Recurrent pulmonary embolism-was on Lovenox as he was refusing oral medications intermittently.  Most recent PE was seen December 2024.  Remains hemodynamically stable Resume DOAC   Essential hypertension On low dose metoprolol.  Has recurrent Hypotension. Midodrine.    PAF, with RVR - BP low, resume midodrine,. Reduced metoprolol.  HR: 90   Chronic systolic CHF-EF 40-45%, global hypokinesis.  Cardiology consulted, GDMT is limited by noncompliance as well as intermittent hypotension.  Continue medical management   Hypokalemia, hypomagnesemia   Hyponatremia -Resolved   Iron deficiency anemia -monitor hemoglobin -Started on ferrous sulfate       Subjective:  Feels ok Discussed w/ RN, pt w/ vague thoughts after being told can't go to SNF  Denies SI/HI to me  Physical Exam: Vitals:   08/18/23 1304 08/18/23 2005 08/19/23 0249 08/19/23 0943  BP: 104/73 113/74 91/65 91/65   Pulse: 100 91 72 72  Resp: 14 18 18    Temp: 98 F (36.7 C) 97.7 F (36.5 C) 98.2 F (36.8 C)   TempSrc: Oral Oral Oral   SpO2: 93% 100% 96%   Weight:      Height:       Physical Exam Vitals reviewed.  Constitutional:      General: He is not in acute distress.    Appearance: He is not ill-appearing or toxic-appearing.  Cardiovascular:     Rate and Rhythm: Normal rate and regular rhythm.     Heart sounds: No murmur heard. Pulmonary:     Effort: Pulmonary effort is normal. No respiratory distress.     Breath sounds: No wheezing, rhonchi or rales.  Neurological:  Mental Status: He is alert.  Psychiatric:        Behavior: Behavior normal.     Data Reviewed: BMP noted Hgb stable 9.5  Family Communication: none  Disposition: Status is: Inpatient Remains inpatient appropriate because: unsafe disposition?     Time spent: 20 minutes  Author: Brendia Sacks, MD 08/19/2023 11:19 AM  For on call review  [email or web address].

## 2023-08-19 NOTE — Progress Notes (Signed)
Pt upset. "Stated someone called in the room and stated that we could not help him anymore" RN explained that he was too healthy medically to go to a Nursing facility. He stated ok "Well I have been in out of this hospital dying for over 2 years.  I think I am supposed to tell you that I am having thoughts about hurting someone, but I will keep those thoughts to myself." RN tried to reassure the pt that he is improving and that that we discuss the situation more as he continues to get better. MD notified.

## 2023-08-19 NOTE — TOC Progression Note (Addendum)
Transition of Care (TOC) - Progression Note   Patient Details  Name: Anthony A Petrich Jr. MRN: [id number] Date of Birth: 10/24/1955  Transition of Care [facility] CM/SW Contact  Ewing Schlein, LCSW Phone Number: 08/19/2023, 9:18 AM  Clinical Narrative: Patient is walking 380 feet with PT and is not eligible for SNF. CSW spoke with patient and patient stated, "I'm not fit to leave here." CSW reiterated patient does not meet criteria for SNF and patient declined to provide CSW with information about his discharge plan. CSW informed patient he can discharge to the [facility], but the [facility] no longer allows people to stay over every night.  Expected Discharge Plan: Homeless Shelter Barriers to Discharge: Continued Medical Work up  Social Determinants of Health (SDOH) Interventions SDOH Screenings   Food Insecurity: Patient Unable To Answer (07/28/2023)  Recent Concern: Food Insecurity - Food Insecurity Present (07/08/2023)  Housing: Patient Unable To Answer (07/28/2023)  Recent Concern: Housing - High Risk (07/08/2023)  Transportation Needs: Patient Unable To Answer (07/28/2023)  Recent Concern: Transportation Needs - Unmet Transportation Needs (07/08/2023)  Utilities: Patient Unable To Answer (07/28/2023)  Recent Concern: Utilities - At Risk (07/08/2023)  Depression (PHQ2-9): Medium Risk (10/16/2020)  Social Connections: Patient Unable To Answer (07/28/2023)  Tobacco Use: High Risk (07/28/2023)   Readmission Risk Interventions    08/19/2023    9:17 AM 07/12/2023    9:58 AM 02/08/2023    5:25 PM  Readmission Risk Prevention Plan  Transportation Screening Complete Complete Complete  Medication Review Oceanographer) Complete Complete Complete  PCP or Specialist appointment within 3-5 days of discharge  -- Complete  HRI or Home Care Consult Complete Complete Complete  SW Recovery Care/Counseling Consult Complete Complete Complete  Palliative Care Screening Not Applicable Not Applicable Not Applicable  Skilled  Nursing Facility Not Applicable Not Applicable Complete

## 2023-08-20 DIAGNOSIS — R410 Disorientation, unspecified: Secondary | ICD-10-CM | POA: Diagnosis not present

## 2023-08-20 NOTE — Progress Notes (Signed)
Progress Note   Patient: Anthony A Thayer Jr. RUE:[id number] DOB: [birth year] DOA: 07/28/2023     22 DOS: the patient was seen and examined on 08/20/2023   Brief hospital course: 68 year old with past medical history significant for alcohol use, frequent withdrawal and delirium in the past, PAF, recent PE not on anticoagulation due to homelessness and nonadherence to medical treatment presents to the hospital from shelter due to delirium.  Hospital course complicated by A-fib with RVR requiring diltiazem drip.  He was also found to have fever 100.6 on 1/10, since then fever has resolved.  Cultures negative.  Hospital course also complicated by persistent encephalopathy, etiology not entirely clear but suspect due to progressive cognitive issues in the setting of ongoing alcohol use.  Assessment and Plan: Acute metabolic encephalopathy, delirium, underlying EtOH use -Concern for Dementia.  -Patient has having episodes like this in the past, noted several times during prior hospitalizations and thought to be in the setting of alcohol withdrawal/DTs.   -Thiamine levels normal, but has received empirical high-dose thiamine. -Ammonia normal, B12 on the lower end, provide B12 injection x 1. Started oral supplement.  -MRI of the brain x 2 was negative for acute findings.  EEG did not show seizures or epileptiform discharges. -RPR negative. -Psychiatry consulted, appreciate input. -Dr Elvera Lennox discussed with Dr. Amada Jupiter with neurology, case reviewed and images reviewed.  Given that he has been having intermittent confusions with prior hospitalizations, this may suggest an underlying neurodegenerative disorder possibly in the setting of longstanding EtOH use.  This is likely to have progressed and may be the main culprit here -SLP consult for cognitive eva score 23/28  suspect he will be unable to take care of himself, and given homelessness disposition may be very difficult -1/24: More agitated, psych  re-consulted. Zyprexa increase to 2.5 mg BID Stable last 120 hours   Recurrent pulmonary embolism-was on Lovenox as he was refusing oral medications intermittently.  Most recent PE was seen December 2024.  Remains hemodynamically stable Continue DOAC   Essential hypertension On low dose metoprolol.  Has recurrent Hypotension. Midodrine.    PAF, with RVR - BP low, resume midodrine,. Reduced metoprolol.  HR: 90   Chronic systolic CHF-EF 40-45%, global hypokinesis.  Cardiology consulted, GDMT is limited by noncompliance as well as intermittent hypotension.  Continue medical management   Hypokalemia, hypomagnesemia   Hyponatremia -Resolved   Iron deficiency anemia -monitor hemoglobin -Started on ferrous sulfate       Subjective:  Feels ok  Physical Exam: Vitals:   08/19/23 1313 08/19/23 2110 08/20/23 0539 08/20/23 0905  BP: 100/70 114/87 113/86 113/86  Pulse: 97 100 76 76  Resp: 16 15 14    Temp: 98.2 F (36.8 C) 98.6 F (37 C) 98.4 F (36.9 C)   TempSrc: Oral Oral Oral   SpO2: 100% 98% 99%   Weight:      Height:       Physical Exam Vitals reviewed.  Constitutional:      General: He is not in acute distress.    Appearance: He is not ill-appearing or toxic-appearing.  Cardiovascular:     Rate and Rhythm: Normal rate and regular rhythm.     Heart sounds: No murmur heard. Pulmonary:     Effort: Pulmonary effort is normal. No respiratory distress.     Breath sounds: No wheezing, rhonchi or rales.  Neurological:     Mental Status: He is alert.  Psychiatric:        Mood and Affect: Mood  normal.        Behavior: Behavior normal.     Data Reviewed: BMP noted Hgb stable 9.5  Family Communication: none  Disposition: Status is: Inpatient Remains inpatient appropriate because: await safe discharge     Time spent: 20 minutes  Author: Brendia Sacks, MD 08/20/2023 10:50 AM  For on call review [email or web address].

## 2023-08-21 DIAGNOSIS — R410 Disorientation, unspecified: Secondary | ICD-10-CM | POA: Diagnosis not present

## 2023-08-21 NOTE — Progress Notes (Signed)
  Progress Note   Patient: Anthony A Maalouf Jr. [id number] DOB: 1956/03/14 DOA: 07/28/2023     23 DOS: the patient was seen and examined on 08/21/2023   Brief hospital course: 68 year old with past medical history significant for alcohol use, frequent withdrawal and delirium in the past, PAF, recent PE not on anticoagulation due to homelessness and nonadherence to medical treatment presents to the hospital from shelter due to delirium.  Hospital course complicated by A-fib with RVR requiring diltiazem drip.  He was also found to have fever 100.6 on 1/10, since then fever has resolved.  Cultures negative.  Hospital course also complicated by persistent encephalopathy, etiology not entirely clear but suspect due to progressive cognitive issues in the setting of ongoing alcohol use.  Assessment and Plan: Acute metabolic encephalopathy, delirium, underlying EtOH use -Concern for Dementia.  -Patient has having episodes like this in the past, noted several times during prior hospitalizations and thought to be in the setting of alcohol withdrawal/DTs.   -Thiamine levels normal, but has received empirical high-dose thiamine. -Ammonia normal, B12 on the lower end, provide B12 injection x 1. Started oral supplement.  -MRI of the brain x 2 was negative for acute findings.  EEG did not show seizures or epileptiform discharges. -RPR negative. -Psychiatry consulted, appreciate input. -Dr Elvera Lennox discussed with Dr. Amada Jupiter with neurology, case reviewed and images reviewed.  Given that he has been having intermittent confusions with prior hospitalizations, this may suggest an underlying neurodegenerative disorder possibly in the setting of longstanding EtOH use.  This is likely to have progressed and may be the main culprit here -SLP consult for cognitive eva score 23/28  suspect he will be unable to take care of himself, and given homelessness disposition may be very difficult -1/24: More agitated, psych  re-consulted. Zyprexa increase to 2.5 mg BID Stable   Recurrent pulmonary embolism-was on Lovenox as he was refusing oral medications intermittently.  Most recent PE was seen December 2024.  Remains hemodynamically stable Continue DOAC   Essential hypertension On low dose metoprolol.  Has recurrent Hypotension. Midodrine.    PAF, with RVR - stable   Chronic systolic CHF-EF 40-45%, global hypokinesis.  Cardiology consulted, GDMT is limited by noncompliance as well as intermittent hypotension.  Continue medical management   Hypokalemia, hypomagnesemia   Hyponatremia -Resolved   Iron deficiency anemia -monitor hemoglobin -Started on ferrous sulfate       Subjective:  Feels ok  Physical Exam: Vitals:   08/20/23 0905 08/20/23 1330 08/20/23 2028 08/21/23 0553  BP: 113/86 103/71 112/86 115/86  Pulse: 76 (!) 105 99 86  Resp:  14 18 20   Temp:  98.2 F (36.8 C) 98.6 F (37 C) 97.9 F (36.6 C)  TempSrc:  Oral Oral Oral  SpO2:  99% 98% 98%  Weight:      Height:       Physical Exam Vitals reviewed.  Psychiatric:        Mood and Affect: Mood normal.        Behavior: Behavior normal.     Data Reviewed: No new data  Family Communication: noen  Disposition: Status is: Inpatient Remains inpatient appropriate because: await safe discharge     Time spent: 15 minutes  Author: Brendia Sacks, MD 08/21/2023 11:48 AM  For on call review [email or web address].

## 2023-08-22 DIAGNOSIS — R410 Disorientation, unspecified: Secondary | ICD-10-CM | POA: Diagnosis not present

## 2023-08-22 NOTE — Progress Notes (Addendum)
  Progress Note   Patient: Anthony A Fieldhouse Jr. ZOX:[id number] DOB: January 03, 1956 DOA: 07/28/2023     24 DOS: the patient was seen and examined on 08/22/2023   Brief hospital course: 67 year old man presenting with delirium.  Previously hospitalized in December for PE.  Multiple presentations to the ED since.  History of noncompliance, alcohol abuse, consideration given to psychiatric maladies, developing dementia.  Admitted for inability to care for self.  Developed atrial fibrillation with rapid ventricular response and was seen by cardiology.  Seen multiple times by psychiatry for delirium.  Hospitalization prolonged by persistent encephalopathy and concern for safe discharge.  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  Not felt to need inpatient psychiatric treatment. 1/13 psychiatry consult for delirium. 1/14 cardiology consultation for atrial fibrillation with rapid ventricular response.  Assessment and Plan: Acute metabolic encephalopathy Delirium Severe alcohol use disorder Dementia Previous episodes during prior hospitalizations and thought to be in the setting of alcohol withdrawal/DTs.   Thiamine levels normal, but has received empirical high-dose thiamine. Ammonia normal, B12 on the lower end, treated. MRI of the brain x 2 was negative for acute findings.  EEG did not show seizures or epileptiform discharges. Seen by psychiatry multiple times for delirium.  Capacity was not assessed. Previous physician discussed with neurology, concern for neurodegenerative disorder possibly in the setting of longstanding EtOH use.  Likely primary etiology. SLP consult for cognitive eva score 23/28  suspect he will be unable to take care of himself, and given homelessness disposition may be very difficult   Recurrent pulmonary embolism Continue apixaban.   Compliance is an issue.   PAF, with RVR  Continue beta-blocker.   Chronic systolic CHF EF 40-45%, global hypokinesis.  Cardiology consulted, GDMT is limited by noncompliance as well as intermittent hypotension.  Continue medical management with beta-blocker and midodrine to support blood pressure.   Iron deficiency anemia -monitor hemoglobin -Started on ferrous sulfate      Subjective:  Feels ok  Physical Exam: Vitals:   08/21/23 0553 08/21/23 1226 08/21/23 2029 08/22/23 0544  BP: 115/86 103/80 100/68 108/82  Pulse: 86 (!) 107 94 94  Resp: 20 18  16   Temp: 97.9 F (36.6 C) 98.5 F (36.9 C) 98.3 F (36.8 C) 97.8 F (36.6 C)  TempSrc: Oral Oral Oral Oral  SpO2: 98% 100% 97% 98%  Weight:      Height:       Physical Exam Vitals reviewed.  Constitutional:      General: He is not in acute distress.    Appearance: He is not ill-appearing or toxic-appearing.  Cardiovascular:     Rate and Rhythm: Normal rate and regular rhythm.     Heart sounds: No murmur heard. Pulmonary:     Effort: Pulmonary effort is normal. No respiratory distress.     Breath sounds: No wheezing, rhonchi or rales.  Neurological:     Mental Status: He is alert.  Psychiatric:        Mood and Affect: Mood normal.        Behavior: Behavior normal.     Data Reviewed: No new data  Family Communication: none  Disposition: Status is: Inpatient     Time spent: 20 minutes  Author: Brendia Sacks, MD 08/22/2023 6:54 PM  For on call review [email or web address].

## 2023-08-23 DIAGNOSIS — R410 Disorientation, unspecified: Secondary | ICD-10-CM | POA: Diagnosis not present

## 2023-08-23 MED ORDER — OLANZAPINE 5 MG PO TABS
5.0000 mg | ORAL_TABLET | Freq: Every day | ORAL | Status: DC
  Administered 2023-08-23 – 2023-08-26 (×4): 5 mg via ORAL
  Filled 2023-08-23 (×4): qty 1

## 2023-08-23 MED ORDER — OLANZAPINE 5 MG PO TABS
2.5000 mg | ORAL_TABLET | Freq: Every day | ORAL | Status: DC
  Administered 2023-08-24 – 2023-08-26 (×3): 2.5 mg via ORAL
  Filled 2023-08-23 (×3): qty 1

## 2023-08-23 NOTE — Progress Notes (Signed)
Progress Note   Patient: Anthony [name]. [id number]:[id number] DOB: 27-Aug-1955 DOA: 07/28/2023     25 DOS: the patient was seen and examined on 08/23/2023   Brief hospital course: 68 year old man presenting with delirium.  Previously hospitalized in December for PE.  Multiple presentations to the ED since.  History of noncompliance, alcohol abuse, consideration given to psychiatric maladies, developing dementia.  Admitted for inability to care for self.  Developed atrial fibrillation with rapid ventricular response and was seen by cardiology.  Seen multiple times by psychiatry for delirium.  Hospitalization prolonged by persistent encephalopathy and concern for safe discharge.  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  Not felt to need inpatient psychiatric treatment. 1/13 psychiatry consult for delirium. 1/14 cardiology consultation for atrial fibrillation with rapid ventricular response.   Assessment and Plan: Acute metabolic encephalopathy Delirium Severe alcohol use disorder Dementia Previous episodes during prior hospitalizations and thought to be in the setting of alcohol withdrawal/DTs.   Thiamine levels normal, but has received empirical high-dose thiamine. Ammonia normal, B12 on the lower end, treated. MRI of the brain x 2 was negative for acute findings.  EEG did not show seizures or epileptiform discharges. Seen by psychiatry multiple times for delirium.  Capacity was not assessed. Previous physician discussed with neurology, concern for neurodegenerative disorder possibly in the setting of longstanding EtOH use.  Likely primary etiology. SLP consult for cognitive eva score 23/28  suspect he will be unable to take care of himself, and given homelessness disposition may be very difficult Seen by psychiatry and felt to have the capacity  to make many decisions including about disposition   Recurrent pulmonary embolism Continue apixaban.  Compliance is an issue.   PAF, with RVR  Continue beta-blocker.   Chronic systolic CHF EF 40-45%, global hypokinesis.  Cardiology consulted, GDMT is limited by noncompliance as well as intermittent hypotension.  Continue medical management with beta-blocker and midodrine to support blood pressure.   Iron deficiency anemia -monitor hemoglobin -Started on ferrous sulfate  Challenging case, discussed with psychiatry.  When I mentioned progressing towards discharge the patient became abusive and threatening, calling for legal action, "I am not leaving" and called me a "prick".  Having interacted with him for 6 days, while he does have some limitations I belief he can care for himself and has an understanding of his options. When discharge was mentioned in the past he started talking about suicide. I believe at this point there is a significant amount of manipulation.  He is not a candidate for SNF or LTC per TOC.     Subjective:  Feels ok "I am not leaving" "I am calling legal"  Physical Exam: Vitals:   08/22/23 0544 08/22/23 2129 08/23/23 0501 08/23/23 0848  BP: 108/82 100/68 103/74 112/79  Pulse: 94 92 (!) 101 (!) 104  Resp: 16 16 16    Temp: 97.8 F (36.6 C) 98 F (36.7 C) 98.2 F (36.8 C)   TempSrc: Oral Oral    SpO2: 98% 97% 99%   Weight:      Height:       Physical Exam Vitals reviewed.  Constitutional:      General: He is not in acute distress.    Appearance: He is not ill-appearing or toxic-appearing.  Cardiovascular:     Rate and Rhythm: Normal rate and regular rhythm.     Heart sounds: No murmur heard.  Pulmonary:     Effort: Pulmonary effort is normal. No respiratory distress.     Breath sounds: No wheezing, rhonchi or rales.  Neurological:     Mental Status: He is alert.  Psychiatric:        Mood and Affect: Mood normal.        Behavior: Behavior normal.      Data Reviewed: No new data  Family Communication: none  Disposition: Status is: Inpatient Remains inpatient appropriate because: psych eval     Time spent: 20 minutes  Author: Brendia Sacks, MD 08/23/2023 10:11 AM  For on call review [email or web address].

## 2023-08-23 NOTE — TOC Progression Note (Signed)
Transition of Care (TOC) - Progression Note    Patient Details  Name: Anthony A Vanwieren Jr. MRN: [id number] Date of Birth: [birth year]  [facility]) CM/SW Contact  Larrie Kass, LCSW Phone Number: 08/23/2023, 1:38 PM  Clinical Narrative:    CSW spoke with the pt regarding discharge planning. The pt will have to return to a homeless shelter if the CSW is unable to secure a boarding home. Pt stated that he does not want to be placed in a nursing facility, saying, "I do not want them to take everything I have." CSW explained that the pt will have to pay for any placement unless it is a shelter. CSW also explained that most homeless shelters have waiting lists. CSW informed the pt that she can help search for a boarding home, but the pt will need to be willing to pay for it. Patient reports an income of $62 a month. TOC to follow.  Expected Discharge Plan: Homeless Shelter Barriers to Discharge: Continued Medical Work up  Expected Discharge Plan and Services                                               Social Determinants of Health (SDOH) Interventions SDOH Screenings   Food Insecurity: Patient Unable To Answer (07/28/2023)  Recent Concern: Food Insecurity - Food Insecurity Present (07/08/2023)  Housing: Patient Unable To Answer (07/28/2023)  Recent Concern: Housing - High Risk (07/08/2023)  Transportation Needs: Patient Unable To Answer (07/28/2023)  Recent Concern: Transportation Needs - Unmet Transportation Needs (07/08/2023)  Utilities: Patient Unable To Answer (07/28/2023)  Recent Concern: Utilities - At Risk (07/08/2023)  Depression (PHQ2-9): Medium Risk (10/16/2020)  Social Connections: Patient Unable To Answer (07/28/2023)  Tobacco Use: High Risk (07/28/2023)    Readmission Risk Interventions    08/19/2023    9:17 AM 07/12/2023    9:58 AM 02/08/2023    5:25 PM  Readmission Risk Prevention Plan  Transportation Screening Complete Complete Complete   Medication Review Oceanographer) Complete Complete Complete  PCP or Specialist appointment within 3-5 days of discharge  -- Complete  HRI or Home Care Consult Complete Complete Complete  SW Recovery Care/Counseling Consult Complete Complete Complete  Palliative Care Screening Not Applicable Not Applicable Not Applicable  Skilled Nursing Facility Not Applicable Not Applicable Complete

## 2023-08-23 NOTE — Consult Note (Signed)
[facility] Psychiatric Consult Initial  Patient Name: .Anthony A Brotzman Jr.  MRN: [id number]  DOB: 04-15-1956  Consult Order details:  capacity assessment/goodrich   Mode of Visit: In person    Psychiatry Consult Evaluation  Service Date: August 23, 2023 LOS:  LOS: 25 days  Chief Complaint "I need to talk to someone"  Primary Psychiatric Diagnoses  Concern for secondary gain 2.  Major neurocognitive disorder, mild-moderate 3.  H/o EtOH use d/o  Assessment  Anthony [name]. is a 68 y.o. male admitted: Medically for 07/28/2023  3:46 AM for alcohol withdrawal. He carries the psychiatric diagnoses of EtOH d/o and sequelae and has a past medical history of  EtOH use d/o and sequlae, hernia, hx PE, lumbar compression fraction, skin cancser, tobacco use d/o, atherosclerosis (see EMR for full recounting).   Psychiatry has been consulted on Anthony [name] 3 times this admission - had signed off x2 however reconsulted for "capacity to care for self outside the hospital".   His current presentation of performing fairly well on standardized neurocognitive tests paired with a series of unmeetable demands is most consistent with attempting to prolong hospitalizaiton beyond what is medically or psychiatrically necessary. He meets criteria for neurocognitive disorder based on formal testing (mild/mod); on bedside exam today he did quite well on formal testing, but would often request that other things be repeated to him several times. He readily supplied details of "hallucinations" at night that have been going on for a while (sundowning?) although per review of EMR has generally denied this (when sensorium disturbed, had no insight). Similarly refused to answer questions about suicidality, although has threatened to develop suicidality in the past when dc dicussed (see note 1/30). Current outpatient psychotropic medications include zyprexa 2.5 BID and historically he has had a good response to these medications  although started since admission. Amenable to increase of PM zyprexa.    Diagnoses:  Active Hospital problems: Principal Problem:   Delirium Active Problems:   Alcohol abuse with alcohol-induced mood disorder (HCC)   Alcohol withdrawal delirium (HCC)   Agitation    Plan   ## Psychiatric Medication Recommendations:  -- olanzapine 2.5 mg BID --> 2.5/5 mg   ## Medical Decision Making Capacity: Feel pt has capacity to make many decisions including about disposition.   ## Further Work-up:  -- none While pt on Qtc prolonging medications, please monitor & replete K+ to 4 and Mg2+ to 2 -- most recent EKG on 1/11 had QtC of 494 in setting of afib/elevatd HR -- Pertinent labwork reviewed earlier this admission includes: EtOH <10 last few presentations   ## Disposition:-- Does not need inpt psych. Reasonable to see if zyprexa helps w/ sundowning.   ## Behavioral / Environmental: -Utilize compassion and acknowledge the patient's experiences while setting clear and realistic expectations for care.    ## Safety and Observation Level:  - Based on my clinical evaluation, I estimate the patient to be at low risk of self harm in the current setting. - At this time, we recommend  routine. This decision is based on my review of the chart including patient's history and current presentation, interview of the patient, mental status examination, and consideration of suicide risk including evaluating suicidal ideation, plan, intent, suicidal or self-harm behaviors, risk factors, and protective factors. This judgment is based on our ability to directly address suicide risk, implement suicide prevention strategies, and develop a safety plan while the patient is in the clinical setting. Please contact our team  if there is a concern that risk level has changed.  CSSR Risk Category:C-SSRS RISK CATEGORY: No Risk  Suicide Risk Assessment: Patient has following modifiable risk factors for suicide: lack of  access to outpatient mental health resources, which we are addressing by providing resources at discharge. Patient has following non-modifiable or demographic risk factors for suicide: male gender Patient has the following protective factors against suicide: Frustration tolerance  Thank you for this consult request. Recommendations have been communicated to the primary team.  We will see x1 and then likely sign off at this time.   Anthony [name]       History of Present Illness  Relevant Aspects of Hospital Hospital Course:  Admitted on 07/28/2023 for alcohol withdrawal. They have had a protracted hospital course due to confusion, delirium, but seem to have responded to IV thiamine.   Patient Report:  Patient seen in AM. He expresses significant frustration that he is going to be "kicked out" of the hospital and does not feel ready to leave. He is fully alert and oriented. He is able to name recent news stories such as "getting into a trade war", "plane crashes", etc. He is able to perform MOYB with no issues. 3/3 words on 5 min recall. Knows the president.   Subjectively has some minor lapses in memory which he draws attention to. Spontaneously discusses episode of what seems like sundowning ("I was fighting in this room 2 nights ago but it wasn't real"). Demands that this Thereasa Parkin get him numbers for a Child psychotherapist, patietn advocate, his PCP, etc. Expresses dissatisfaction he hasn't been offered medication or seen a psychiatrist - redirected to the fact that he is on zyprexa and we have discussed increasing it several times. To me endorses delusions for "years" - outside of living dreams/sundowning this is not consistent with numerous psychiatric evaluations over the last 2-3 years. States he will go back to drinking if discharged "if I'm not here people can't tell me what to do".   Psych ROS:  To me endorsed sx primarily of memory problems, sundowning, etc. Refused ot answer questions about  SI ("That's not relevant") or outside of his pre-determined topics.   ROS   Psychiatric and Social History  Psychiatric History:  See prior consult note this admission.   Social History:  See prior consult note this admission. Access to weapons/lethal means: denied 08/13/2023  Substance History See prior consult note this admission.   Exam Findings  Physical Exam:  Vital Signs:  Temp:  [98 F (36.7 C)-98.2 F (36.8 C)] 98.2 F (36.8 C) (02/03 0501) Pulse Rate:  [92-104] 104 (02/03 0848) Resp:  [16] 16 (02/03 0501) BP: (100-112)/(68-79) 112/79 (02/03 0848) SpO2:  [97 %-99 %] 99 % (02/03 0501) Blood pressure 112/79, pulse (!) 104, temperature 98.2 F (36.8 C), resp. rate 16, height 5\' 8"  (1.727 m), weight 64.7 kg, SpO2 99%. Body mass index is 21.69 kg/m.   Physical Exam HENT:     Head: Normocephalic.  Eyes:     Conjunctiva/sclera: Conjunctivae normal.  Pulmonary:     Effort: Pulmonary effort is normal.  Neurological:     Mental Status: He is alert.     Mental Status Exam: General Appearance: Fairly Groomed  Orientation:  Full (Time, Place, and Person)  Memory:  Immediate;   Good Recent;   Fair Remote;   Fair  Concentration:  Concentration: Good  Recall:  Good  Attention  Fair  Eye Contact:  Good  Speech:  Clear  and Coherent  Language:  Good  Volume:  Normal  Mood: "I don't feel ready to leave the hospital"  Affect:  Congruent  Thought Process:  Coherent and Goal Directed  Thought Content:   devoid of overt delusions, paranoia  ([facility] is indeed trying to discharge him in next few days)  Suicidal Thoughts:   Did not respond; no dangerous behaviors over prolonged stay.   Homicidal Thoughts:   Did not respond  Judgement:  Good  Insight:  Good  Psychomotor Activity:  Normal  Akathisia:  No  Fund of Knowledge:  Good      Assets:  Communication Skills Resilience  Cognition: Grossly intact.   ADL's: defer to PT/OT.   AIMS (if indicated):        Other  History   These have been pulled in through the EMR, reviewed, and updated if appropriate.  Family History:  The patient's family history includes Cancer in his sister; Dementia in his father; Heart attack in his father; Stroke in his father.  Medical History: Past Medical History:  Diagnosis Date   Actinic keratosis 11/27/2016   Alcohol abuse with alcohol-induced mood disorder (HCC) 07/18/2018   Alcohol use disorder, severe, dependence (HCC) 09/16/2006   05/22/2015 Argumentative, belligerent and verbally abusive to staff per his note from [place]    Alcoholism [facility])    Anxiety state 04/25/2018   Aortic atherosclerosis (HCC) 08/27/2022   Chronic low back pain 09/16/2006   Followed by NS.  Per his chart from [place], he fell off two storyhouse roof while cleaning his brother's gutters and sustained compression fracture of L3, L4 and L5 and fracture of right transverse process at L3 and nondisplaced fracture of left glenoid rim many years ago. He had multiple imaging in [place] including Lumbar MRI in 2016 which showed moderate to severe spinal canal stenos   Delirium tremens (HCC) 09/01/2017   Depression    Dry eye 11/23/2016   Foreign body in middle portion of esophagus 05/19/2022   Hiatal hernia 09/14/2016   Status Collis gastroplasty and Nissen's fundoplication on 04/29/2015 in [place]   History of delirium tremons 07/22/2020   DTs during 10/2016 08/2017. And 12/2017 admissions    History of pulmonary embolus (PE) 11/02/2016   Unprovoked 11/01/2016. Xarelto from 11/01/16 to 09/08/2017.   Hydrocele    Surgically corrected   Hypertension    Hypoalbuminemia due to protein-calorie malnutrition (HCC) 07/22/2020   Lumbar compression fracture (HCC)    Multiple rib fractures 07/22/2019   Left rib details XR: left ribs demonstrate multiple remote rib   Pancytopenia (HCC)    Rhabdomyolysis 07/22/2020   Skin cancer    exciced 2017   Tinea versicolor 06/08/2013   Tobacco  use disorder 07/22/2020   Tooth infection 04/25/2018   Trigger finger, acquired 11/18/2012   Ulnar tunnel syndrome of right wrist 11/08/2012    Surgical History: Past Surgical History:  Procedure Laterality Date   BIOPSY  05/21/2022   Procedure: BIOPSY;  Surgeon: Lynann Bologna, MD;  Location: [facility] ENDOSCOPY;  Service: Gastroenterology;;   CATARACT EXTRACTION     ESOPHAGOGASTRODUODENOSCOPY (EGD) WITH PROPOFOL N/A 05/21/2022   Procedure: ESOPHAGOGASTRODUODENOSCOPY (EGD) WITH PROPOFOL;  Surgeon: Lynann Bologna, MD;  Location: [facility] ENDOSCOPY;  Service: Gastroenterology;  Laterality: N/A;   FOREIGN BODY REMOVAL N/A 05/21/2022   Procedure: FOREIGN BODY REMOVAL;  Surgeon: Lynann Bologna, MD;  Location: [facility] ENDOSCOPY;  Service: Gastroenterology;  Laterality: N/A;   HIATAL HERNIA REPAIR  2016   HYDROCELE EXCISION / REPAIR  2010  SKIN CANCER EXCISION Left    Excised 2017     Medications:   Current Facility-Administered Medications:    acetaminophen (TYLENOL) tablet 650 mg, 650 mg, Oral, Q6H PRN, 650 mg at 08/20/23 1301 **OR** acetaminophen (TYLENOL) suppository 650 mg, 650 mg, Rectal, Q6H PRN, Kirby Crigler, Mir M, MD   acetaminophen (TYLENOL) tablet 650 mg, 650 mg, Oral, TID, Regalado, Belkys A, MD, 650 mg at 08/23/23 0841   apixaban (ELIQUIS) tablet 5 mg, 5 mg, Oral, BID, Standley Brooking, MD, 5 mg at 08/23/23 1610   feeding supplement (ENSURE ENLIVE / ENSURE PLUS) liquid 237 mL, 237 mL, Oral, BID BM, Kirby Crigler, Mir M, MD, 237 mL at 08/23/23 0844   ferrous sulfate tablet 325 mg, 325 mg, Oral, Q breakfast, Regalado, Belkys A, MD, 325 mg at 08/23/23 9604   folic acid (FOLVITE) tablet 1 mg, 1 mg, Oral, Daily, Sheikh, Omair Latif, DO, 1 mg at 08/23/23 5409   hydrALAZINE (APRESOLINE) injection 10 mg, 10 mg, Intravenous, Q4H PRN, Amin, Ankit C, MD   hydrOXYzine (ATARAX) tablet 25 mg, 25 mg, Oral, TID PRN, Maryagnes Amos, FNP, 25 mg at 08/22/23 2145   ipratropium-albuterol (DUONEB) 0.5-2.5 (3)  MG/3ML nebulizer solution 3 mL, 3 mL, Nebulization, Q4H PRN, Amin, Ankit C, MD   LORazepam (ATIVAN) injection 1 mg, 1 mg, Intramuscular, Once, Regalado, Belkys A, MD   magnesium oxide (MAG-OX) tablet 200 mg, 200 mg, Oral, BID, Regalado, Belkys A, MD, 200 mg at 08/23/23 0841   metoprolol succinate (TOPROL-XL) 24 hr tablet 25 mg, 25 mg, Oral, Daily, Regalado, Belkys A, MD, 25 mg at 08/23/23 0848   metoprolol tartrate (LOPRESSOR) injection 5 mg, 5 mg, Intravenous, Q4H PRN, Amin, Ankit C, MD, 5 mg at 08/09/23 2347   midodrine (PROAMATINE) tablet 5 mg, 5 mg, Oral, TID WC, Regalado, Belkys A, MD, 5 mg at 08/23/23 0841   multivitamin with minerals tablet 1 tablet, 1 tablet, Oral, Daily, Marguerita Merles Latif, DO, 1 tablet at 08/23/23 0841   OLANZapine (ZYPREXA) injection 2.5 mg, 2.5 mg, Intramuscular, Q6H PRN, Charm Rings, NP, 2.5 mg at 08/14/23 1645   OLANZapine (ZYPREXA) tablet 2.5 mg, 2.5 mg, Oral, q12n4p, Mariel Craft, MD, 2.5 mg at 08/22/23 2145   ondansetron (ZOFRAN) injection 4 mg, 4 mg, Intravenous, Q6H PRN, Amin, Ankit C, MD   Oral care mouth rinse, 15 mL, Mouth Rinse, PRN, Regalado, Belkys A, MD   thiamine (VITAMIN B1) tablet 100 mg, 100 mg, Oral, Daily, 100 mg at 08/23/23 0842 **OR** thiamine (VITAMIN B1) injection 100 mg, 100 mg, Intravenous, Daily, Sheikh, Omair Latif, DO   vitamin B-12 (CYANOCOBALAMIN) tablet 500 mcg, 500 mcg, Oral, Daily, Regalado, Belkys A, MD, 500 mcg at 08/23/23 0841  Allergies: Allergies  Allergen Reactions   Other Itching and Other (See Comments)    Seasonal allergies- Itchy eyes, runny nose, congestion   Poison Ivy Extract Rash    Evany Schecter A Mahima Hottle

## 2023-08-24 DIAGNOSIS — F1014 Alcohol abuse with alcohol-induced mood disorder: Secondary | ICD-10-CM | POA: Diagnosis not present

## 2023-08-24 DIAGNOSIS — R410 Disorientation, unspecified: Secondary | ICD-10-CM | POA: Diagnosis not present

## 2023-08-24 MED ORDER — MELATONIN 5 MG PO TABS
5.0000 mg | ORAL_TABLET | Freq: Once | ORAL | Status: AC
  Administered 2023-08-25: 5 mg via ORAL
  Filled 2023-08-24: qty 1

## 2023-08-24 NOTE — Progress Notes (Signed)
 Progress Note   Patient: Anthony [name]. FMW:[id number] DOB: 07/13/1956 DOA: 07/28/2023     26 DOS: the patient was seen and examined on 08/24/2023   Brief hospital course: 68 year old man presenting with delirium.  Previously hospitalized in December for PE.  Multiple presentations to the ED since.  History of noncompliance, alcohol  abuse, consideration given to psychiatric maladies, developing dementia.  Admitted for inability to care for self.  Developed atrial fibrillation with rapid ventricular response and was seen by cardiology.  Seen multiple times by psychiatry for delirium.  Hospitalization prolonged by persistent encephalopathy and concern for safe discharge.  Condition gradually improved.  Seen again by psychiatry, felt to have capacity to make many decisions.  At this point CSW pursuing boarding home.  Likely home to the shelter in the near future.  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  Not felt to need inpatient psychiatric treatment. 1/13 psychiatry consult for delirium. 1/14 cardiology consultation for atrial fibrillation with rapid ventricular response.   Assessment and Plan: Acute metabolic encephalopathy Delirium Severe alcohol  use disorder Dementia Previous episodes during prior hospitalizations and thought to be in the setting of alcohol  withdrawal/DTs.   Thiamine  levels normal, but has received empirical high-dose thiamine . Ammonia normal, B12 on the lower end, treated. MRI of the brain x 2 was negative for acute findings.  EEG did not show seizures or epileptiform discharges. Seen by psychiatry multiple times for delirium.   Previous physician discussed with neurology, concern for neurodegenerative disorder possibly in the setting of longstanding EtOH use.  Likely primary etiology. SLP consult for cognitive eva  score 23/28  suspect he will be unable to take care of himself, and given homelessness disposition may be very difficult Seen by psychiatry and felt to have the capacity to make many decisions including about disposition.   Recurrent pulmonary embolism Continue apixaban .  Compliance is an issue.   PAF, with RVR  Continue beta-blocker.   Chronic systolic CHF EF 40-45%, global hypokinesis.  Cardiology consulted, GDMT is limited by noncompliance as well as intermittent hypotension.  Continue medical management with beta-blocker and midodrine  to support blood pressure.   Iron  deficiency anemia -monitor hemoglobin -Started on ferrous sulfate   Further discussion with psychiatry and interaction with the patient, believe he has capacity at this time to make many medical decisions.  He starts talking about legal action when discharge is mentioned.  If unable to obtain boarding home arrangements with social work, would anticipate discharge to shelter near future.  Risk for rehospitalization is extraordinarily high.      Subjective:  Feels ok today  Physical Exam: Vitals:   08/23/23 2055 08/24/23 0441 08/24/23 0854 08/24/23 1240  BP: 107/64 100/69 117/89 97/66  Pulse: (!) 102 87 89 84  Resp: 18 20  17   Temp: 98.3 F (36.8 C) 97.9 F (36.6 C)  97.7 F (36.5 C)  TempSrc: Oral Oral  Oral  SpO2: 97% 99%  98%  Weight:      Height:       Physical Exam Vitals reviewed.  Constitutional:      General: He is not in acute distress.    Appearance: He is not ill-appearing or toxic-appearing.  Cardiovascular:     Rate and Rhythm: Normal rate and regular rhythm.     Heart sounds: No murmur heard. Pulmonary:     Effort: Pulmonary effort is normal. No respiratory distress.  Breath sounds: No wheezing, rhonchi or rales.  Neurological:     Mental Status: He is alert.  Psychiatric:        Mood and Affect: Mood normal.        Behavior: Behavior normal.     Data Reviewed: No new  data  Family Communication: none  Disposition: Status is: Inpatient      Time spent: 20 minutes  Author: Toribio Door, MD 08/24/2023 6:16 PM  For on call review [email or web address].

## 2023-08-24 NOTE — TOC Progression Note (Signed)
 Transition of Care (TOC) - Progression Note    Patient Details  Name: Anthony A Morro Jr. MRN: [id number] Date of Birth: Oct 20, 1955  Transition of Care [facility]) CM/SW Contact  Tawni CHRISTELLA Eva, LCSW Phone Number: 08/24/2023, 3:14 PM  Clinical Narrative:    CSW met with the pt to assist in calling [facility] to secure a boarding home. Pt was informed that he qualifies for the program, but there are no available rooms in [place], [place], or [place]. They suggested the pt call daily to check on bed availability. Pt was also informed that he would need to pay $700 per month. He was provided with contact information. Pt has requested that the CSW contact [employer] to inquire about the possibility of returning. CSW called Jon at [employer] and left a voicemail requesting a return call.    CSW attempted to contact [facility] regarding her boarding home, but there was no answer and the voicemail was not accessible. A text message was sent, but there was no response. TOC to follow.   Expected Discharge Plan: Homeless Shelter Barriers to Discharge: Continued Medical Work up  Expected Discharge Plan and Services                                               Social Determinants of Health (SDOH) Interventions SDOH Screenings   Food Insecurity: Patient Unable To Answer (07/28/2023)  Recent Concern: Food Insecurity - Food Insecurity Present (07/08/2023)  Housing: Patient Unable To Answer (07/28/2023)  Recent Concern: Housing - High Risk (07/08/2023)  Transportation Needs: Patient Unable To Answer (07/28/2023)  Recent Concern: Transportation Needs - Unmet Transportation Needs (07/08/2023)  Utilities: Patient Unable To Answer (07/28/2023)  Recent Concern: Utilities - At Risk (07/08/2023)  Depression (PHQ2-9): Medium Risk (10/16/2020)  Social Connections: Patient Unable To Answer (07/28/2023)  Tobacco Use: High Risk (07/28/2023)    Readmission Risk Interventions    08/19/2023     9:17 AM 07/12/2023    9:58 AM 02/08/2023    5:25 PM  Readmission Risk Prevention Plan  Transportation Screening Complete Complete Complete  Medication Review [employer]) Complete Complete Complete  PCP or Specialist appointment within 3-5 days of discharge  -- Complete  HRI or Home Care Consult Complete Complete Complete  SW Recovery Care/Counseling Consult Complete Complete Complete  Palliative Care Screening Not Applicable Not Applicable Not Applicable  Skilled Nursing Facility Not Applicable Not Applicable Complete

## 2023-08-24 NOTE — Consult Note (Signed)
 [facility] Psychiatric Consult Initial  Patient Name: .Anthony A Discher Jr.  MRN: [id number]  DOB: 14-Jan-1956  Consult Order details:  capacity assessment/goodrich   Mode of Visit: In person    Psychiatry Consult Evaluation  Service Date: August 24, 2023 LOS:  LOS: 26 days  Chief Complaint I need to talk to someone  Primary Psychiatric Diagnoses  Concern for secondary gain 2.  Major neurocognitive disorder, mild-moderate 3.  H/o EtOH use d/o  Assessment  Anthony [name]. is a 68 y.o. male admitted: Medically for 07/28/2023  3:46 AM for alcohol  withdrawal. He carries the psychiatric diagnoses of EtOH d/o and sequelae and has a past medical history of  EtOH use d/o and sequlae, hernia, hx PE, lumbar compression fraction, skin cancser, tobacco use d/o, atherosclerosis (see EMR for full recounting).   Psychiatry has been consulted on Anthony [name] 3 times this admission - had signed off x2 however reconsulted for capacity to care for self outside the hospital.   His current presentation of performing fairly well on standardized neurocognitive tests paired with a series of unmeetable demands is most consistent with attempting to prolong hospitalizaiton beyond what is medically or psychiatrically necessary. He meets criteria for neurocognitive disorder based on formal testing (mild/mod); on bedside exam today he did quite well on formal testing, but would often request that other things be repeated to him several times. He readily supplied details of hallucinations at night that have been going on for a while (sundowning?) although per review of EMR has generally denied this (when sensorium disturbed, had no insight). Similarly refused to answer questions about suicidality, although has threatened to develop suicidality in the past when dc dicussed (see note 1/30). Current outpatient psychotropic medications include zyprexa  2.5 BID and historically he has had a good response to these medications  although started since admission - seems to have had some marginal benefit of increae to 2.5/5 mg. At this point, would not offer medication changes in near future. Pt appropriate for outpt f/u from psychiatry perspective have asked SW to put resources in chart. Psychiatry signing off.    Evidence indicates that subsequent suicide attempts by?patients who made contingent suicide threats (defined as threatened suicide or exaggerated suicidality) are?uncommon in both groups. Hospitalization should not be used as a substitute for social services, substance abuse treatment, and legal assistance for patients who make contingent suicide threats (Characteristics and six-month outcome of patients who use suicide threats to seek hospital admission. (1996). Psychiatric Services, 47(8), [phone]. Doi:10.1176/ps.47.8.871)       Diagnoses:  Active Hospital problems: Principal Problem:   Delirium Active Problems:   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)   Alcohol  withdrawal delirium (HCC)   Agitation    Plan   ## Psychiatric Medication Recommendations:  -- olanzapine  2.5/5 mg   ## Medical Decision Making Capacity: Feel pt has capacity to make many decisions including about disposition.   ## Further Work-up:  -- none While pt on Qtc prolonging medications, please monitor & replete K+ to 4 and Mg2+ to 2 -- most recent EKG on 1/11 had QtC of 494 in setting of afib/elevatd HR -- Pertinent labwork reviewed earlier this admission includes: EtOH <10 last few presentations   ## Disposition:-- Does not need inpt psych. Reasonable to see if zyprexa  helps w/ sundowning.   ## Behavioral / Environmental: -Utilize compassion and acknowledge the patient's experiences while setting clear and realistic expectations for care.    ## Safety and Observation Level:  -  Based on my clinical evaluation, I estimate the patient to be at low risk of self harm in the current setting. - At this time, we  recommend  routine. This decision is based on my review of the chart including patient's history and current presentation, interview of the patient, mental status examination, and consideration of suicide risk including evaluating suicidal ideation, plan, intent, suicidal or self-harm behaviors, risk factors, and protective factors. This judgment is based on our ability to directly address suicide risk, implement suicide prevention strategies, and develop a safety plan while the patient is in the clinical setting. Please contact our team if there is a concern that risk level has changed.  CSSR Risk Category:C-SSRS RISK CATEGORY: No Risk  Suicide Risk Assessment: Patient has following modifiable risk factors for suicide: lack of access to outpatient mental health resources, which we are addressing by providing resources at discharge. Patient has following non-modifiable or demographic risk factors for suicide: male gender Patient has the following protective factors against suicide: Frustration tolerance  Thank you for this consult request. Recommendations have been communicated to the primary team.  We will sign off at this time.   Naira Standiford A Jamonte Curfman       History of Present Illness  Relevant Aspects of Hospital Hospital Course:  Admitted on 07/28/2023 for alcohol  withdrawal. They have had a protracted hospital course due to confusion, delirium, but seem to have responded to IV thiamine .   Patient Report:  Patient seen in PM. Before asking about orientation, states No one asked me what date it was in the morning; I thought it was Monday but it's not, is it; similarly brings up deficits narratively and spontaneously - again brought up alleged episode of sundowning several days ago. Discussed his plans for housing after discharge at length. He has been keeping an eye on the weather and is hopeful that as it warms up beds at the homeless shelters will open up. He has been calling [facility] (confirmed  with SW) and completed an intake. Focusing on getting as much protein as he can while here; as he cannot eat the sandwiches due to bad teeth eats yogurt, drinks ensure, etc.   He discussed his finances and limitations (can pay for a motel only on very coldest nights). I commented on all of the work he was doing to ensure a good situation after discharge and he became upset, accused me of trying to make him say he was suicidal, stated everyone has been suicidal at some point but did not directly state any SI, HI, etc at any point during evaluation.    Psych ROS:  To me endorsed sx primarily of memory problems, sundowning, etc. Alludes to past suicidality but does not directly endorse suicidality today - regardless this has only come up this admission when dc discussed.   ROS  Focuses on above.   Psychiatric and Social History  Psychiatric History:  See prior consult note this admission.   Social History:  See prior consult note this admission. Access to weapons/lethal means: denied 08/13/2023  Substance History See prior consult note this admission.   Exam Findings  Physical Exam:  Vital Signs:  Temp:  [97.7 F (36.5 C)-98.3 F (36.8 C)] 97.7 F (36.5 C) (02/04 1240) Pulse Rate:  [84-102] 84 (02/04 1240) Resp:  [17-20] 17 (02/04 1240) BP: (97-117)/(64-89) 97/66 (02/04 1240) SpO2:  [97 %-99 %] 98 % (02/04 1240) Blood pressure 97/66, pulse 84, temperature 97.7 F (36.5 C), temperature source Oral, resp. rate  17, height 5' 8 (1.727 m), weight 64.7 kg, SpO2 98%. Body mass index is 21.69 kg/m.   Physical Exam HENT:     Head: Normocephalic.  Eyes:     Conjunctiva/sclera: Conjunctivae normal.  Pulmonary:     Effort: Pulmonary effort is normal.  Neurological:     Mental Status: He is alert.     Mental Status Exam: General Appearance: Fairly Groomed  Orientation:  Full (Time, Place, and Person)  Memory:  Immediate;   Good Recent;   Fair Remote;   Fair  Concentration:   Concentration: Good  Recall:  Good  Attention  Fair  Eye Contact:  Good  Speech:  Clear and Coherent  Language:  Good  Volume:  Normal  Mood: I don't feel ready to leave the hospital  Affect:  Congruent  Thought Process:  Coherent and Goal Directed  Thought Content:  devoid of overt delusions, paranoia (Anthony [name] is indeed trying to discharge him in next few days)  Suicidal Thoughts:  Did not respond; no dangerous behaviors over prolonged stay.   Homicidal Thoughts:  Did not respond  Judgement:  Good  Insight:  Good  Psychomotor Activity:  Normal  Akathisia:  No  Fund of Knowledge:  Good      Assets:  Communication Skills Resilience  Cognition: Grossly intact.   ADL's: defer to PT/OT.   AIMS (if indicated):        Other History   These have been pulled in through the EMR, reviewed, and updated if appropriate.  Family History:  The patient's family history includes Cancer in his sister; Dementia in his father; Heart attack in his father; Stroke in his father.  Medical History: Past Medical History:  Diagnosis Date  . Actinic keratosis 11/27/2016  . 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) 07/18/2018  . Alcohol  use disorder, severe, dependence (HCC) 09/16/2006   05/22/2015 Argumentative, belligerent and verbally abusive to staff per his note from [place]    . Alcoholism (HCC)   . Anxiety state 04/25/2018  . Aortic atherosclerosis (HCC) 08/27/2022  . Chronic low back pain 09/16/2006   Followed by NS.  Per his chart from [place] , he fell off two storyhouse roof while cleaning his brother's gutters and sustained compression fracture of L3, L4 and L5 and fracture of right transverse process at L3 and nondisplaced fracture of left glenoid rim many years ago. He had multiple imaging in [place]  including Lumbar MRI in 2016 which showed moderate to severe spinal canal stenos  . Delirium tremens (HCC) 09/01/2017  . Depression   . Dry eye 11/23/2016  .  Foreign body in middle portion of esophagus 05/19/2022  . Hiatal hernia 09/14/2016   Status Collis gastroplasty and Nissen's fundoplication on 04/29/2015 in [place]   . History of delirium tremons 07/22/2020   DTs during 10/2016 08/2017. And 12/2017 admissions   . History of pulmonary embolus (PE) 11/02/2016   Unprovoked 11/01/2016. Xarelto  from 11/01/16 to 09/08/2017.  SABRA Hydrocele    Surgically corrected  . Hypertension   . Hypoalbuminemia due to protein-calorie malnutrition (HCC) 07/22/2020  . Lumbar compression fracture (HCC)   . Multiple rib fractures 07/22/2019   Left rib details XR: left ribs demonstrate multiple remote rib  . Pancytopenia (HCC)   . Rhabdomyolysis 07/22/2020  . Skin cancer    exciced 2017  . Tinea versicolor 06/08/2013  . Tobacco use disorder 07/22/2020  . Tooth infection 04/25/2018  . Trigger finger, acquired 11/18/2012  . Ulnar tunnel syndrome of right wrist 11/08/2012  Surgical History: Past Surgical History:  Procedure Laterality Date  . BIOPSY  05/21/2022   Procedure: BIOPSY;  Surgeon: Charlanne Groom, MD;  Location: [facility] ENDOSCOPY;  Service: Gastroenterology;;  . CATARACT EXTRACTION    . ESOPHAGOGASTRODUODENOSCOPY (EGD) WITH PROPOFOL  N/A 05/21/2022   Procedure: ESOPHAGOGASTRODUODENOSCOPY (EGD) WITH PROPOFOL ;  Surgeon: Charlanne Groom, MD;  Location: [facility] ENDOSCOPY;  Service: Gastroenterology;  Laterality: N/A;  . FOREIGN BODY REMOVAL N/A 05/21/2022   Procedure: FOREIGN BODY REMOVAL;  Surgeon: Charlanne Groom, MD;  Location: [facility] ENDOSCOPY;  Service: Gastroenterology;  Laterality: N/A;  . HIATAL HERNIA REPAIR  2016  . HYDROCELE EXCISION / REPAIR  2010  . SKIN CANCER EXCISION Left    Excised 2017     Medications:   Current Facility-Administered Medications:  .  acetaminophen  (TYLENOL ) tablet 650 mg, 650 mg, Oral, Q6H PRN, 650 mg at 08/20/23 1301 **OR** acetaminophen  (TYLENOL ) suppository 650 mg, 650 mg, Rectal, Q6H PRN, Zella, Mir M, MD .   acetaminophen  (TYLENOL ) tablet 650 mg, 650 mg, Oral, TID, Regalado, Belkys A, MD, 650 mg at 08/24/23 1637 .  apixaban  (ELIQUIS ) tablet 5 mg, 5 mg, Oral, BID, Jadine Toribio SQUIBB, MD, 5 mg at 08/24/23 0846 .  feeding supplement (ENSURE ENLIVE / ENSURE PLUS) liquid 237 mL, 237 mL, Oral, BID BM, Zella, Mir M, MD, 237 mL at 08/24/23 1637 .  ferrous sulfate  tablet 325 mg, 325 mg, Oral, Q breakfast, Regalado, Belkys A, MD, 325 mg at 08/24/23 0846 .  folic acid  (FOLVITE ) tablet 1 mg, 1 mg, Oral, Daily, Sheikh, Omair Latif, DO, 1 mg at 08/24/23 9153 .  hydrALAZINE  (APRESOLINE ) injection 10 mg, 10 mg, Intravenous, Q4H PRN, Amin, Ankit C, MD .  hydrOXYzine  (ATARAX ) tablet 25 mg, 25 mg, Oral, TID PRN, Wilkie Majel RAMAN, FNP, 25 mg at 08/23/23 2135 .  ipratropium-albuterol  (DUONEB) 0.5-2.5 (3) MG/3ML nebulizer solution 3 mL, 3 mL, Nebulization, Q4H PRN, Amin, Ankit C, MD .  LORazepam  (ATIVAN ) injection 1 mg, 1 mg, Intramuscular, Once, Regalado, Belkys A, MD .  magnesium  oxide (MAG-OX) tablet 200 mg, 200 mg, Oral, BID, Regalado, Belkys A, MD, 200 mg at 08/24/23 0846 .  melatonin tablet 5 mg, 5 mg, Oral, Once, Blondie Lynwood POUR, NP .  metoprolol  succinate (TOPROL -XL) 24 hr tablet 25 mg, 25 mg, Oral, Daily, Regalado, Belkys A, MD, 25 mg at 08/24/23 0854 .  metoprolol  tartrate (LOPRESSOR ) injection 5 mg, 5 mg, Intravenous, Q4H PRN, Amin, Ankit C, MD, 5 mg at 08/09/23 2347 .  midodrine  (PROAMATINE ) tablet 5 mg, 5 mg, Oral, TID WC, Regalado, Belkys A, MD, 5 mg at 08/24/23 1637 .  multivitamin with minerals tablet 1 tablet, 1 tablet, Oral, Daily, Sheikh, Omair Latif, [place], 1 tablet at 08/24/23 9153 .  OLANZapine  (ZYPREXA ) injection 2.5 mg, 2.5 mg, Intramuscular, Q6H PRN, Jacquetta Sharlot GRADE, NP, 2.5 mg at 08/14/23 1645 .  OLANZapine  (ZYPREXA ) tablet 2.5 mg, 2.5 mg, Oral, QPC lunch, Anisten Tomassi A, 2.5 mg at 08/24/23 1150 .  OLANZapine  (ZYPREXA ) tablet 5 mg, 5 mg, Oral, QHS, Nastassia Bazaldua A, 5 mg at  08/23/23 2135 .  ondansetron  (ZOFRAN ) injection 4 mg, 4 mg, Intravenous, Q6H PRN, Amin, Ankit C, MD .  Oral care mouth rinse, 15 mL, Mouth Rinse, PRN, Regalado, Belkys A, MD .  thiamine  (VITAMIN B1) tablet 100 mg, 100 mg, Oral, Daily, 100 mg at 08/24/23 0846 **OR** thiamine  (VITAMIN B1) injection 100 mg, 100 mg, Intravenous, Daily, Sheikh, Omair Latif, DO .  vitamin B-12 (CYANOCOBALAMIN ) tablet 500 mcg, 500 mcg,  Oral, Daily, Regalado, Belkys A, MD, 500 mcg at 08/24/23 0846  Allergies: Allergies  Allergen Reactions  . Other Itching and Other (See Comments)    Seasonal allergies- Itchy eyes, runny nose, congestion  . Poison Ivy Extract Rash    Rollene A Tyleigh Mahn

## 2023-08-24 NOTE — Progress Notes (Signed)
 OT Cancellation Note  Patient Details Name: Anthony A Tarnowski Jr. MRN: [id number] DOB: Mar 03, 1956   Cancelled Treatment:    Reason Eval/Treat Not Completed: Patient declined, no reason specified. Despite encouragement, pt adamantly refusing to work with OT to walk to bathroom. OT walked by pt room ~ 2 minutes later and pt out of chair and in bathroom still refusing to work with OT  Jacques, Karna Loose 08/24/2023, 11:35 AM

## 2023-08-25 DIAGNOSIS — R410 Disorientation, unspecified: Secondary | ICD-10-CM | POA: Diagnosis not present

## 2023-08-25 NOTE — TOC Progression Note (Signed)
 Transition of Care (TOC) - Progression Note    Patient Details  Name: Anthony A Lok Jr. MRN: [id number] Date of Birth: [birth year]  Transition of Care [facility]) CM/SW Contact  Tawni CHRISTELLA Eva, LCSW Phone Number: 08/25/2023, 9:06 AM  Clinical Narrative:     CSW spoke with Charlena Moats. She reported having an open male bed in her boarding home. She mentioned that she would like to review the pt before making a decision. She stated that she will follow up with CSW today to set a time to speak with the pt. TOC to follow.    Expected Discharge Plan: Homeless Shelter Barriers to Discharge: Continued Medical Work up  Expected Discharge Plan and Services                                               Social Determinants of Health (SDOH) Interventions SDOH Screenings   Food Insecurity: Patient Unable To Answer (07/28/2023)  Recent Concern: Food Insecurity - Food Insecurity Present (07/08/2023)  Housing: Patient Unable To Answer (07/28/2023)  Recent Concern: Housing - High Risk (07/08/2023)  Transportation Needs: Patient Unable To Answer (07/28/2023)  Recent Concern: Transportation Needs - Unmet Transportation Needs (07/08/2023)  Utilities: Patient Unable To Answer (07/28/2023)  Recent Concern: Utilities - At Risk (07/08/2023)  Depression (PHQ2-9): Medium Risk (10/16/2020)  Social Connections: Patient Unable To Answer (07/28/2023)  Tobacco Use: High Risk (07/28/2023)    Readmission Risk Interventions    08/19/2023    9:17 AM 07/12/2023    9:58 AM 02/08/2023    5:25 PM  Readmission Risk Prevention Plan  Transportation Screening Complete Complete Complete  Medication Review Oceanographer) Complete Complete Complete  PCP or Specialist appointment within 3-5 days of discharge  -- Complete  HRI or Home Care Consult Complete Complete Complete  SW Recovery Care/Counseling Consult Complete Complete Complete  Palliative Care Screening Not Applicable Not Applicable Not Applicable   Skilled Nursing Facility Not Applicable Not Applicable Complete

## 2023-08-25 NOTE — Progress Notes (Signed)
 PROGRESS NOTE    Anthony Aadan Chenier Bills Jr.  FMW:[id number] DOB: 1956-01-20 DOA: 07/28/2023 PCP: Pcp, No  Chief Complaint  Patient presents with   Homeless    Brief Narrative:   68 year old man presenting with delirium. Previously hospitalized in December for PE. Multiple presentations to the ED since. History of noncompliance, alcohol  abuse, consideration given to psychiatric maladies, developing dementia. Admitted for inability to care for self. Developed atrial fibrillation with rapid ventricular response and was seen by cardiology. Seen multiple times by psychiatry for delirium. Hospitalization prolonged by persistent encephalopathy and concern for safe discharge. Condition gradually improved. Seen again by psychiatry, felt to have capacity to make many decisions. At this point CSW pursuing boarding home. Likely home to the shelter in the near future.   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   Thiamine  levels normal, but has received empirical high-dose thiamine . Ammonia normal, B12 on the lower end, treated. MRI of the brain x 2 was negative for acute findings.  EEG did not show seizures or epileptiform discharges. Seen by psychiatry multiple times for delirium.   Previous physician discussed with neurology, concern for neurodegenerative disorder possibly in the setting of longstanding EtOH use.  Likely primary etiology. SLP consult for cognitive eva score 23/28  suspect he will be unable to take care of himself, and given homelessness disposition may be very difficult Seen by psychiatry and felt to have the capacity to make many decisions including about disposition.  No  plans for medication changes in near future.    Recurrent pulmonary embolism Continue apixaban .  Compliance is an issue.   PAF, with RVR  Continue beta-blocker.   Chronic systolic CHF EF 40-45%, global hypokinesis.  Cardiology consulted, GDMT is limited by noncompliance as well as intermittent hypotension.  Continue medical management with beta-blocker and midodrine  to support blood pressure.   Iron  deficiency anemia -monitor hemoglobin -Started on ferrous sulfate    Further discussion with psychiatry and interaction with the patient, believe he has capacity at this time to make many medical decisions.  He starts talking about legal action when discharge is mentioned.  If unable to obtain boarding home arrangements with social work, would anticipate discharge to shelter near future.  Risk for rehospitalization is extraordinarily high.      DVT prophylaxis: eliquis  Code Status: full Family Communication: none Disposition:   Status is: Inpatient Remains inpatient appropriate because: need for placement   Consultants:  psych  Procedures:  none  Antimicrobials:  Anti-infectives (From admission, onward)    Start     Dose/Rate Route Frequency Ordered Stop   08/01/23 1630  piperacillin -tazobactam (ZOSYN ) IVPB 3.375 g  Status:  Discontinued        3.375 g 12.5 mL/hr over 240 Minutes Intravenous Every 8 hours 08/01/23 1537 08/03/23 1430   07/31/23 1000  vancomycin  (VANCOCIN ) 750 mg in sodium chloride  0.9 % 250 mL IVPB  Status:  Discontinued        750 mg 250 mL/hr over 60 Minutes Intravenous Every 12 hours 07/30/23 2034 08/03/23 1430   07/30/23 2100  vancomycin  (VANCOREADY) IVPB 1250 mg/250 mL        1,250 mg 166.7 mL/hr over 90 Minutes Intravenous  Once 07/30/23 2013 07/31/23  0700   07/30/23 2030  ceFEPIme  (MAXIPIME ) 2 g in sodium chloride  0.9 % 100 mL IVPB  Status:  Discontinued        2 g 200 mL/hr over 30 Minutes Intravenous Every 8 hours 07/30/23 2013 08/01/23 1533        Subjective: No complaints  Objective: Vitals:   08/24/23 2005 08/25/23 0511 08/25/23 1000 08/25/23 1607  BP: 105/68 114/81 114/81 109/69  Pulse: 97 89 89 89  Resp: 16 18  18   Temp: 97.9 F (36.6 C) 98 F (36.7 C)  98.2 F (36.8 C)  TempSrc: Oral   Oral  SpO2: 99% 97%  98%  Weight:      Height:       No intake or output data in the 24 hours ending 08/25/23 1935 Filed Weights   07/28/23 2159  Weight: 64.7 kg    Examination:  General exam: Appears calm and comfortable  Respiratory system: unlabored Cardiovascular system: RRR Central nervous system: Alert and oriented. No focal neurological deficits. Extremities: no LEE    Data Reviewed: I have personally reviewed following labs and imaging studies  CBC: No results for input(s): WBC, NEUTROABS, HGB, HCT, MCV, PLT in the last 168 hours.  Basic Metabolic Panel: No results for input(s): NA, K, CL, CO2, GLUCOSE, BUN, CREATININE, CALCIUM , MG, PHOS in the last 168 hours.  GFR: Estimated Creatinine Clearance: 82 mL/min (Jaelene Garciagarcia) (by C-G formula based on SCr of 0.33 mg/dL (L)).  Liver Function Tests: No results for input(s): AST, ALT, ALKPHOS, BILITOT, PROT, ALBUMIN  in the last 168 hours.  CBG: No results for input(s): GLUCAP in the last 168 hours.   No results found for this or any previous visit (from the past 240 hours).       Radiology Studies: No results found.      Scheduled Meds:  acetaminophen   650 mg Oral TID   apixaban   5 mg Oral BID   feeding supplement  237 mL Oral BID BM   ferrous sulfate   325 mg Oral Q breakfast   folic acid   1 mg Oral Daily   LORazepam   1 mg Intramuscular Once   magnesium  oxide  200 mg Oral BID   melatonin  5 mg Oral Once   metoprolol  succinate  25 mg Oral Daily   midodrine   5 mg Oral TID WC   multivitamin with minerals  1 tablet Oral Daily   OLANZapine   2.5 mg Oral QPC lunch   OLANZapine   5 mg Oral QHS   thiamine   100 mg  Oral Daily   Or   thiamine   100 mg Intravenous Daily   vitamin B-12  500 mcg Oral Daily   Continuous Infusions:   LOS: 27 days    Time spent: over 30 min    Meliton Monte, MD [facility]   To contact the attending provider between 7A-7P or the covering provider during after hours 7P-7A, please log into the web site [email or web address] and access using universal [place] password for that web site. If you do not have the password, please call the hospital operator.  08/25/2023, 7:35 PM

## 2023-08-26 DIAGNOSIS — R410 Disorientation, unspecified: Secondary | ICD-10-CM | POA: Diagnosis not present

## 2023-08-26 LAB — COMPREHENSIVE METABOLIC PANEL
ALT: 30 U/L (ref 0–44)
AST: 32 U/L (ref 15–41)
Albumin: 3.4 g/dL — ABNORMAL LOW (ref 3.5–5.0)
Alkaline Phosphatase: 76 U/L (ref 38–126)
Anion gap: 11 (ref 5–15)
BUN: 33 mg/dL — ABNORMAL HIGH (ref 8–23)
CO2: 22 mmol/L (ref 22–32)
Calcium: 9.7 mg/dL (ref 8.9–10.3)
Chloride: 107 mmol/L (ref 98–111)
Creatinine, Ser: 0.54 mg/dL — ABNORMAL LOW (ref 0.61–1.24)
GFR, Estimated: >60 mL/min (ref >60)
Glucose, Bld: 88 mg/dL (ref 70–99)
Potassium: 4.1 mmol/L (ref 3.5–5.1)
Sodium: 140 mmol/L (ref 135–145)
Total Bilirubin: 0.6 mg/dL (ref 0.0–1.2)
Total Protein: 7.3 g/dL (ref 6.5–8.1)

## 2023-08-26 LAB — CBC WITH DIFFERENTIAL/PLATELET
Abs Immature Granulocytes: 0.01 10*3/uL (ref 0.00–0.07)
Basophils Absolute: 0 10*3/uL (ref 0.0–0.1)
Basophils Relative: 1 %
Eosinophils Absolute: 0.2 10*3/uL (ref 0.0–0.5)
Eosinophils Relative: 5 %
HCT: 32.5 % — ABNORMAL LOW (ref 39.0–52.0)
Hemoglobin: 10.2 g/dL — ABNORMAL LOW (ref 13.0–17.0)
Immature Granulocytes: 0 %
Lymphocytes Relative: 49 %
Lymphs Abs: 1.9 10*3/uL (ref 0.7–4.0)
MCH: 32.1 pg (ref 26.0–34.0)
MCHC: 31.4 g/dL (ref 30.0–36.0)
MCV: 102.2 fL — ABNORMAL HIGH (ref 80.0–100.0)
Monocytes Absolute: 0.6 10*3/uL (ref 0.1–1.0)
Monocytes Relative: 14 %
Neutro Abs: 1.2 10*3/uL — ABNORMAL LOW (ref 1.7–7.7)
Neutrophils Relative %: 31 %
Platelets: 261 10*3/uL (ref 150–400)
RBC: 3.18 MIL/uL — ABNORMAL LOW (ref 4.22–5.81)
RDW: 16.5 % — ABNORMAL HIGH (ref 11.5–15.5)
WBC: 3.9 10*3/uL — ABNORMAL LOW (ref 4.0–10.5)
nRBC: 0 % (ref 0.0–0.2)

## 2023-08-26 LAB — MAGNESIUM: Magnesium: 1.9 mg/dL (ref 1.7–2.4)

## 2023-08-26 LAB — PHOSPHORUS: Phosphorus: 5.3 mg/dL — ABNORMAL HIGH (ref 2.5–4.6)

## 2023-08-26 NOTE — Progress Notes (Signed)
 PROGRESS NOTE    Anthony Pegge Cumberledge Marcello Jr.  FMW:[id number] DOB: January 31, 1956 DOA: 07/28/2023 PCP: Pcp, No  Chief Complaint  Patient presents with   Homeless    Brief Narrative:   68 year old man presenting with delirium. Previously hospitalized in December for PE. Multiple presentations to the ED since. History of noncompliance, alcohol  abuse, consideration given to psychiatric maladies, developing dementia. Admitted for inability to care for self. Developed atrial fibrillation with rapid ventricular response and was seen by cardiology. Seen multiple times by psychiatry for delirium. Hospitalization prolonged by persistent encephalopathy and concern for safe discharge. Condition gradually improved. Seen again by psychiatry, felt to have capacity to make many decisions. At this point CSW pursuing boarding home. Likely home to the shelter in the near future.   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   Thiamine  levels normal, but has received empirical high-dose thiamine . Ammonia normal, B12 on the lower end, treated. MRI of the brain x 2 was negative for acute findings.  EEG did not show seizures or epileptiform discharges. Seen by psychiatry multiple times for delirium.   Previous physician discussed with neurology, concern for neurodegenerative disorder possibly in the setting of longstanding EtOH use.  Likely primary etiology. SLP consult for cognitive eva score 23/28  suspect he will be unable to take care of himself, and given homelessness disposition may be very difficult Seen by psychiatry and felt to have the capacity to make many decisions including about disposition.  No  plans for medication changes in near future.    Recurrent pulmonary embolism Continue apixaban .  Compliance is an issue.   PAF, with RVR  Continue beta-blocker.   Chronic systolic CHF EF 40-45%, global hypokinesis.  Cardiology consulted, GDMT is limited by noncompliance as well as intermittent hypotension.  Continue medical management with beta-blocker and midodrine  to support blood pressure.   Iron  deficiency anemia -monitor hemoglobin -Started on ferrous sulfate    Possible d/c 2/7 to [facility]      DVT prophylaxis: eliquis  Code Status: full Family Communication: none Disposition:   Status is: Inpatient Remains inpatient appropriate because: need for placement   Consultants:  psych  Procedures:  none  Antimicrobials:  Anti-infectives (From admission, onward)    Start     Dose/Rate Route Frequency Ordered Stop   08/01/23 1630  piperacillin -tazobactam (ZOSYN ) IVPB 3.375 g  Status:  Discontinued        3.375 g 12.5 mL/hr over 240 Minutes Intravenous Every 8 hours 08/01/23 1537 08/03/23 1430   07/31/23 1000  vancomycin  (VANCOCIN ) 750 mg in sodium chloride  0.9 % 250 mL IVPB  Status:  Discontinued        750 mg 250 mL/hr over 60 Minutes Intravenous Every 12 hours 07/30/23 2034 08/03/23 1430   07/30/23 2100  vancomycin  (VANCOREADY) IVPB 1250 mg/250 mL        1,250 mg 166.7 mL/hr over 90 Minutes Intravenous  Once 07/30/23 2013 07/31/23 0700   07/30/23 2030  ceFEPIme  (MAXIPIME ) 2 g in sodium chloride  0.9 % 100 mL IVPB  Status:  Discontinued        2 g 200 mL/hr over 30 Minutes Intravenous Every 8 hours 07/30/23 2013 08/01/23 1533  Subjective: No complaints Asking about d/c plan  Objective: Vitals:   08/26/23 0619 08/26/23 0828 08/26/23 1037 08/26/23 1152  BP: 102/78 92/71 98/70  114/81  Pulse: 77 94 85 95  Resp: 15   18  Temp: 98.2 F (36.8 C)   97.7 F (36.5 C)  TempSrc: Oral   Oral  SpO2: 98%   100%  Weight:      Height:         Intake/Output Summary (Last 24 hours) at 08/26/2023 1539 Last data filed at 08/26/2023 1300 Gross per 24 hour  Intake 570 ml  Output 600 ml  Net -30 ml   Filed Weights   07/28/23 2159  Weight: 64.7 kg    Examination:   General: No acute distress. Cardiovascular: RRR Lungs: unlabored Abdomen: Soft, nontender, nondistended  Neurological: Alert. Moves all extremities 4 with equal strength. Cranial nerves II through XII grossly intact. Extremities: No clubbing or cyanosis. No edema.   Data Reviewed: I have personally reviewed following labs and imaging studies  CBC: Recent Labs  Lab 08/26/23 0424  WBC 3.9*  NEUTROABS 1.2*  HGB 10.2*  HCT 32.5*  MCV 102.2*  PLT 261    Basic Metabolic Panel: Recent Labs  Lab 08/26/23 0424  NA 140  K 4.1  CL 107  CO2 22  GLUCOSE 88  BUN 33*  CREATININE 0.54*  CALCIUM  9.7  MG 1.9  PHOS 5.3*    GFR: Estimated Creatinine Clearance: 82 mL/min (Taray Normoyle) (by C-G formula based on SCr of 0.54 mg/dL (L)).  Liver Function Tests: Recent Labs  Lab 08/26/23 0424  AST 32  ALT 30  ALKPHOS 76  BILITOT 0.6  PROT 7.3  ALBUMIN  3.4*    CBG: No results for input(s): GLUCAP in the last 168 hours.   No results found for this or any previous visit (from the past 240 hours).       Radiology Studies: No results found.      Scheduled Meds:  acetaminophen   650 mg Oral TID   apixaban   5 mg Oral BID   feeding supplement  237 mL Oral BID BM   ferrous sulfate   325 mg Oral Q breakfast   folic acid   1 mg Oral Daily   LORazepam   1 mg Intramuscular Once   magnesium  oxide  200 mg Oral BID   metoprolol  succinate  25 mg Oral Daily   midodrine   5 mg Oral TID WC   multivitamin with minerals  1 tablet Oral Daily   OLANZapine   2.5 mg Oral QPC lunch   OLANZapine   5 mg Oral QHS   thiamine   100 mg Oral Daily   Or   thiamine   100 mg Intravenous Daily   vitamin B-12  500 mcg Oral Daily   Continuous Infusions:   LOS: 28 days    Time  spent: over 30 min    Meliton Monte, MD [facility]   To contact the attending provider between 7A-7P or the covering provider during after hours 7P-7A, please log into the web site [email or web address] and access using universal [place] password for that web site. If you do not have the password, please call the hospital operator.  08/26/2023, 3:39 PM

## 2023-08-26 NOTE — Plan of Care (Signed)
  Problem: Education: Goal: Knowledge of General Education information will improve Description: Including pain rating scale, medication(s)/side effects and non-pharmacologic comfort measures Outcome: Progressing   Problem: Health Behavior/Discharge Planning: Goal: Ability to manage health-related needs will improve Outcome: Progressing   Problem: Clinical Measurements: Goal: Ability to maintain clinical measurements within normal limits will improve Outcome: Progressing Goal: Will remain free from infection Outcome: Progressing Goal: Diagnostic test results will improve Outcome: Progressing Goal: Respiratory complications will improve Outcome: Progressing Goal: Cardiovascular complication will be avoided Outcome: Progressing   Problem: Activity: Goal: Risk for activity intolerance will decrease Outcome: Progressing   Problem: Nutrition: Goal: Adequate nutrition will be maintained Outcome: Progressing   Problem: Coping: Goal: Level of anxiety will decrease Outcome: Progressing   Problem: Elimination: Goal: Will not experience complications related to bowel motility Outcome: Progressing Goal: Will not experience complications related to urinary retention Outcome: Progressing   Problem: Pain Management: Goal: General experience of comfort will improve Outcome: Progressing   Problem: Safety: Goal: Ability to remain free from injury will improve Outcome: Progressing   Problem: Skin Integrity: Goal: Risk for impaired skin integrity will decrease Outcome: Progressing   Problem: Safety: Goal: Violent Restraint(s) Outcome: Progressing   Problem: Safety: Goal: Non-violent Restraint(s) Outcome: Progressing

## 2023-08-26 NOTE — Progress Notes (Signed)
 Mobility Specialist - Progress Note   08/26/23 1431  Mobility  Activity Ambulated independently in hallway  Level of Assistance Independent  Assistive Device None  Distance Ambulated (ft) 350 ft  Activity Response Tolerated well  Mobility Referral Yes  Mobility visit 1 Mobility  Mobility Specialist Start Time (ACUTE ONLY) 1400  Mobility Specialist Stop Time (ACUTE ONLY) 1431  Mobility Specialist Time Calculation (min) (ACUTE ONLY) 31 min   Pt received in bed and agreeable to mobility. No complaints during session. Pt to bed after session with all needs met.    [facility]

## 2023-08-26 NOTE — TOC Progression Note (Signed)
 Transition of Care (TOC) - Progression Note    Patient Details  Name: Anthony A Guild Jr. MRN: [id number] Date of Birth: Jul 12, 1956  Transition of Care [facility]) CM/SW Contact  Tawni CHRISTELLA Eva, LCSW Phone Number: 08/26/2023, 1:55 PM  Clinical Narrative:    CSW received a call from Mrs. Nicholaus to discuss the boarding home. The pt spoke with Mrs. Davis via phone to discuss expectations for living in the home. The pt has agreed and was provided with contact information and the expectation to meet the owner at 6 p.m. tomorrow. Ms. Nicholaus stated the room will not be ready until 6 p.m. Friday. The pt mentioned needing to go to the bank before going to the boarding home. The pt requested a bus pass at the time of discharge. The pt also requested a cane; however, the pt has received a cane within the past five years and is not eligible for a cane at this time. TOC to follow.       Expected Discharge Plan and Services                                               Social Determinants of Health (SDOH) Interventions SDOH Screenings   Food Insecurity: Patient Unable To Answer (07/28/2023)  Recent Concern: Food Insecurity - Food Insecurity Present (07/08/2023)  Housing: Patient Unable To Answer (07/28/2023)  Recent Concern: Housing - High Risk (07/08/2023)  Transportation Needs: Patient Unable To Answer (07/28/2023)  Recent Concern: Transportation Needs - Unmet Transportation Needs (07/08/2023)  Utilities: Patient Unable To Answer (07/28/2023)  Recent Concern: Utilities - At Risk (07/08/2023)  Depression (PHQ2-9): Medium Risk (10/16/2020)  Social Connections: Patient Unable To Answer (07/28/2023)  Tobacco Use: High Risk (07/28/2023)    Readmission Risk Interventions    08/19/2023    9:17 AM 07/12/2023    9:58 AM 02/08/2023    5:25 PM  Readmission Risk Prevention Plan  Transportation Screening Complete Complete Complete  Medication Review Oceanographer) Complete Complete Complete  PCP  or Specialist appointment within 3-5 days of discharge  -- Complete  HRI or Home Care Consult Complete Complete Complete  SW Recovery Care/Counseling Consult Complete Complete Complete  Palliative Care Screening Not Applicable Not Applicable Not Applicable  Skilled Nursing Facility Not Applicable Not Applicable Complete

## 2023-08-27 ENCOUNTER — Other Ambulatory Visit: Payer: Self-pay

## 2023-08-27 ENCOUNTER — Other Ambulatory Visit (HOSPITAL_COMMUNITY): Payer: Self-pay

## 2023-08-27 DIAGNOSIS — R410 Disorientation, unspecified: Secondary | ICD-10-CM | POA: Diagnosis not present

## 2023-08-27 MED ORDER — OLANZAPINE 2.5 MG PO TABS
2.5000 mg | ORAL_TABLET | Freq: Every day | ORAL | 1 refills | Status: DC
  Filled 2023-08-27: qty 30, 30d supply, fill #0

## 2023-08-27 MED ORDER — OLANZAPINE 5 MG PO TABS
5.0000 mg | ORAL_TABLET | Freq: Every day | ORAL | 1 refills | Status: DC
  Filled 2023-08-27: qty 30, 30d supply, fill #0

## 2023-08-27 MED ORDER — MIDODRINE HCL 5 MG PO TABS
5.0000 mg | ORAL_TABLET | Freq: Three times a day (TID) | ORAL | 1 refills | Status: AC
  Filled 2023-08-27: qty 90, 30d supply, fill #0

## 2023-08-27 MED ORDER — METOPROLOL SUCCINATE ER 25 MG PO TB24
25.0000 mg | ORAL_TABLET | Freq: Every day | ORAL | 1 refills | Status: DC
  Filled 2023-08-27: qty 30, 30d supply, fill #0

## 2023-08-27 MED ORDER — CYANOCOBALAMIN 500 MCG PO TABS
500.0000 ug | ORAL_TABLET | Freq: Every day | ORAL | 1 refills | Status: AC
  Filled 2023-08-27: qty 30, 30d supply, fill #0

## 2023-08-27 MED ORDER — APIXABAN 5 MG PO TABS
5.0000 mg | ORAL_TABLET | Freq: Two times a day (BID) | ORAL | 1 refills | Status: DC
  Filled 2023-08-27: qty 60, 30d supply, fill #0

## 2023-08-27 NOTE — Plan of Care (Signed)
   Problem: Education: Goal: Knowledge of General Education information will improve Description Including pain rating scale, medication(s)/side effects and non-pharmacologic comfort measures Outcome: Progressing   Problem: Health Behavior/Discharge Planning: Goal: Ability to manage health-related needs will improve Outcome: Progressing

## 2023-08-27 NOTE — Discharge Summary (Signed)
 Physician Discharge Summary  Anthony Kaneisha Ellenberger Alpert Jr. 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 Previously hospitalized in December for PE. Multiple presentations to the ED since. History of noncompliance, alcohol  abuse, consideration given to psychiatric maladies, developing dementia. Admitted for inability to care for self. Developed atrial fibrillation with rapid ventricular response and was seen by cardiology. Seen multiple times by psychiatry for delirium. Hospitalization prolonged by persistent encephalopathy and concern for safe discharge. Condition gradually improved. Seen again by psychiatry, felt to have capacity to make many decisions. At this point CSW pursuing boarding home. Likely home to the shelter in the near future.   Stable for discharge today 2/7.  Going to boarding house.  See below for additional details.   Hospital Course:  Assessment and Plan:  Acute metabolic encephalopathy Delirium Severe alcohol  use disorder Dementia Previous episodes during prior hospitalizations and thought to be in the setting of alcohol  withdrawal/DTs.   Thiamine  levels normal, but has received empirical high-dose thiamine . Ammonia normal, B12 on the lower end, treated. MRI of the brain x 2 was negative for acute findings.  EEG did  not show seizures or epileptiform discharges. Seen by psychiatry multiple times for delirium.   Previous physician discussed with neurology, concern for neurodegenerative disorder possibly in the setting of longstanding EtOH use.  Likely primary etiology. SLP consult for cognitive eva score 23/28  suspect he will be unable to take care of himself, and given homelessness disposition may be very difficult -- discharging to boarding house 2/7. Seen by psychiatry and felt to have the capacity to make many decisions including about disposition.  No plans for medication changes in near future.  Currently on zyprexa .   Recurrent pulmonary embolism Continue apixaban .  Compliance is an issue.   PAF, with RVR  Continue beta-blocker.   Chronic systolic CHF EF 40-45%, global hypokinesis.  Cardiology consulted, GDMT is limited by noncompliance as well as intermittent hypotension.  Continue medical management with beta-blocker and midodrine  to support blood pressure.   Iron  deficiency anemia -monitor hemoglobin -Started on ferrous sulfate     Procedures: none   Consultations: Psychiatry cardiology  Discharge Exam: Vitals:   08/27/23 0554 08/27/23 0855  BP: 106/71 125/83  Pulse: 92 85  Resp: 14   Temp: 97.8 F (36.6 C)   SpO2: 98%    Eager to discharge, no complaints  General: No acute distress. Sitting up in chair no no distress. Lungs: unlabored Neurological: Alert and oriented. Moves all extremities 4  Cranial nerves II through XII grossly intact.  Extremities: No clubbing or cyanosis. No edema.  Discharge Instructions   Discharge Instructions     (HEART FAILURE PATIENTS) Call MD:  Anytime you have any of the following symptoms: 1) 3 pound weight gain in 24 hours or 5 pounds in 1 week 2) shortness of breath, with or without Marshal Schrecengost dry hacking cough 3) swelling in  the hands, feet or stomach 4) if you have to sleep on extra pillows at night in order to breathe.   Complete by: As  directed    Ambulatory referral to Neurology   Complete by: As directed    An appointment is requested in approximately: 4 weeks   Call MD for:  difficulty breathing, headache or visual disturbances   Complete by: As directed    Call MD for:  extreme fatigue   Complete by: As directed    Call MD for:  hives   Complete by: As directed    Call MD for:  persistant dizziness or light-headedness   Complete by: As directed    Call MD for:  persistant nausea and vomiting   Complete by: As directed    Call MD for:  redness, tenderness, or signs of infection (pain, swelling, redness, odor or green/yellow discharge around incision site)   Complete by: As directed    Call MD for:  severe uncontrolled pain   Complete by: As directed    Call MD for:  temperature >100.4   Complete by: As directed    Diet - low sodium heart healthy   Complete by: As directed    Discharge instructions   Complete by: As directed    You were seen for confusion.  We've made some adjustments to your medicines.  You've been started on zyprexa .  You should follow outpatient with psychiatry and neurology.    We've arranged for you to [facility] to [facility].  I've refilled your eliquis  and prescribed metoprolol  and midodrine  (Grenda Lora medicine to keep your blood pressure up).  Return for new, recurrent, or worsening symptoms.  Please ask your PCP to request records from this hospitalization so they know what was done and what the next steps will be.   Increase activity slowly   Complete by: As directed       Allergies as of 08/27/2023       Reactions   Other Itching, Other (See Comments)   Seasonal allergies- Itchy eyes, runny nose, congestion   Poison Ivy Extract Rash        Medication List     STOP taking these medications    metoprolol  tartrate 25 MG tablet Commonly known as: LOPRESSOR        TAKE these medications    acetaminophen  500 MG tablet Commonly known as: TYLENOL  Take 1 tablet (500  mg total) by mouth every 8 (eight) hours as needed.   apixaban  5 MG Tabs tablet Commonly known as: ELIQUIS  Take 1 tablet (5 mg total) by mouth 2 (two) times daily. What changed:  how much to take how to take this when to take this additional instructions   cyanocobalamin  500 MCG tablet Commonly known as: VITAMIN B12 Take 1 tablet (500 mcg total) by mouth daily. Start taking on: August 28, 2023   FeroSul 325 (65 Fe) MG tablet Generic drug: ferrous sulfate  Take 1 tablet (325 mg total) by mouth daily with breakfast.   folic acid  1 MG tablet Commonly known as: FOLVITE  Take 1 tablet (1 mg total) by mouth daily.   magnesium  oxide 400 (240 Mg) MG tablet Commonly known as: MAG-OX Take 1 tablet (400 mg total) by mouth 2 (two) times daily.   metoprolol  succinate 25 MG 24 hr tablet Commonly known as: TOPROL -XL Take 1 tablet (25 mg total) by mouth daily. Start taking on: August 28, 2023   midodrine  5 MG tablet Commonly known as: PROAMATINE  Take 1  tablet (5 mg total) by mouth 3 (three) times daily with meals.   mometasone -formoterol  200-5 MCG/ACT Aero Commonly known as: DULERA  Inhale 2 puffs into the lungs 2 (two) times daily.   OLANZapine  2.5 MG tablet Commonly known as: ZYPREXA  Take 1 tablet (2.5 mg total) by mouth daily after lunch.   OLANZapine  5 MG tablet Commonly known as: ZYPREXA  Take 1 tablet (5 mg total) by mouth at bedtime.   pantoprazole  40 MG tablet Commonly known as: PROTONIX  Take 1 tablet (40 mg total) by mouth daily.   rosuvastatin  10 MG tablet Commonly known as: CRESTOR  Take 1 tablet (10 mg total) by mouth daily.   thiamine  100 MG tablet Commonly known as: VITAMIN B1 Take 1 tablet (100 mg total) by mouth daily.       Allergies  Allergen Reactions   Other Itching and Other (See Comments)    Seasonal allergies- Itchy eyes, runny nose, congestion   Poison Ivy Extract Rash      The results of significant diagnostics from this hospitalization  (including imaging, microbiology, ancillary and laboratory) are listed below for reference.    Significant Diagnostic Studies: MR BRAIN WO CONTRAST Result Date: 08/09/2023 CLINICAL DATA:  Initial evaluation for mental status change, unknown cause. EXAM: MRI HEAD WITHOUT CONTRAST TECHNIQUE: Multiplanar, multiecho pulse sequences of the brain and surrounding structures were obtained without intravenous contrast. COMPARISON:  Prior brain MRI from 07/30/2023 FINDINGS: Brain: Diffuse prominence of the CSF containing spaces compatible with generalized cerebral atrophy, advanced for age. Minimal patchy T2/FLAIR hyperintensity noted involving the supratentorial cerebral white matter, most likely related to chronic microvascular ischemic disease, less than is typically seen for patient age. No evidence for acute or subacute ischemia. Gray-white matter differentiation maintained. No areas of chronic cortical infarction. No acute or chronic intracranial blood products. No mass lesion, midline shift or mass effect. No hydrocephalus or extra-axial fluid collection. Pituitary gland and suprasellar region within normal limits. Vascular: Major intracranial vascular flow voids are maintained. Skull and upper cervical spine: Craniocervical junction within normal limits. Bone marrow signal intensity normal. No scalp soft tissue abnormality. Sinuses/Orbits: Prior bilateral ocular lens replacement. Paranasal sinuses are largely clear. Trace bilateral mastoid effusions noted, of doubtful significance. Negative nasopharynx. Other: None. IMPRESSION: 1. No acute intracranial abnormality. 2. Advanced cerebral atrophy for age. Electronically Signed   By: Morene Hoard M.D.   On: 08/09/2023 20:36   DG CHEST PORT 1 VIEW Result Date: 08/01/2023 CLINICAL DATA:  68 year old male with shortness of breath. Status post pulmonary embolus in December. EXAM: PORTABLE CHEST 1 VIEW COMPARISON:  Portable chest yesterday and earlier. FINDINGS:  Portable AP semi upright view at 0534 hours. Continued low lung volumes. Mediastinal contours remain normal. No pneumothorax. Mild new veiling opacity at the right lung base. Otherwise stable lung markings, no consolidation or confluent opacity. No convincing acute pulmonary edema. Right pleural effusion by CTA in December. Left upper quadrant surgical clips. Paucity of upper abdominal bowel gas. Stable visualized osseous structures. IMPRESSION: Ongoing low lung volumes with veiling right lung base opacity which is probably unresolved right pleural effusion seen in December. No other convincing acute cardiopulmonary abnormality. Electronically Signed   By: VEAR Hurst M.D.   On: 08/01/2023 08:52   DG CHEST PORT 1 VIEW Result Date: 07/31/2023 CLINICAL DATA:  Fever, decreased O2 sats EXAM: PORTABLE CHEST 1 VIEW COMPARISON:  07/27/2023 FINDINGS: Heart is mildly enlarged. No overt edema. Bibasilar opacities likely reflect atelectasis. No effusions or pneumothorax. No acute bony abnormality. IMPRESSION: Cardiomegaly.  Bibasilar atelectasis. Electronically Signed   By: Franky Crease M.D.   On: 07/31/2023 08:45   MR BRAIN WO CONTRAST Result Date: 07/30/2023 CLINICAL DATA:  Altered mental status EXAM: MRI HEAD WITHOUT CONTRAST TECHNIQUE: Multiplanar, multiecho pulse sequences of the brain and surrounding structures were obtained without intravenous contrast. COMPARISON:  None Available. FINDINGS: Evaluation is limited by motion artifact. The exam could not be completed due to the patient's inability to cooperate. Brain: No restricted diffusion to suggest acute or subacute infarct. No acute hemorrhage, mass, mass effect, or midline shift. No hydrocephalus or significant extra-axial collection. Vascular: Normal arterial flow voids. Skull and upper cervical spine: Normal marrow signal. Sinuses/Orbits: Clear paranasal sinuses. No acute finding in the orbits. Status post bilateral lens replacements. Other: Trace fluid in the  mastoid air cells. IMPRESSION: Evaluation is limited by motion artifact and the patient's inability to complete the exam. Within this limitation, no acute intracranial process. No evidence of acute or subacute infarct. Electronically Signed   By: Donald Campion M.D.   On: 07/30/2023 19:36   DG Abd 1 View Result Date: 07/30/2023 CLINICAL DATA:  [id number] Abdominal discomfort [id number] EXAM: ABDOMEN - 1 VIEW COMPARISON:  X-ray abdomen 12/02/2022, CT abdomen pelvis 02/03/2023 FINDINGS: Bilateral upper abdomen/hemidiaphragms are collimated off view. Surgical clips overlie the left upper abdomen. The bowel gas pattern is normal. Limited evaluation for free gas on this supine radiograph. No radio-opaque calculi or other significant radiographic abnormality are seen. Vascular calcifications. IMPRESSION: Nonobstructive bowel gas pattern. Bilateral upper abdomen/hemidiaphragms are collimated off view. Electronically Signed   By: Morgane  Naveau M.D.   On: 07/30/2023 16:30   EEG adult Result Date: 07/30/2023 [place] Arlin KIDD, MD     07/30/2023  1:41 PM Patient Name: Anthony Carry Ortez Reihl Jr. MRN: [id number] Epilepsy Attending: Arlin KIDD [place] Referring Physician/Provider: Sherrill Alejandro Donovan, DO Date: 07/30/2023 Duration: 25.44 mins Patient history: 68yo M with ams getting eeg to evaluate for seizure Level of alertness: Awake AEDs during EEG study: Ativan  Technical aspects: This EEG study was done with scalp electrodes positioned according to the 10-20 International system of electrode placement. Electrical activity was reviewed with band pass filter of 1-70Hz , sensitivity of 7 uV/mm, display speed of 56mm/sec with Rosely Fernandez 60Hz  notched filter applied as appropriate. EEG data were recorded continuously and digitally stored.  Video monitoring was available and reviewed as appropriate. Description: The posterior dominant rhythm consists of 7.5 Hz activity of moderate voltage (25-35 uV) seen predominantly in posterior head regions, symmetric and  reactive to eye opening and eye closing. EEG showed intermittent generalized 5 to 6 Hz theta slowing. Physiologic photic driving was not seen during photic stimulation.  \Hyperventilation was not performed.   ABNORMALITY - Intermittent slow, generalized IMPRESSION: This study is suggestive of mild diffuse encephalopathy. No seizures or epileptiform discharges were seen throughout the recording. Arlin KIDD [place]    Microbiology: No results found for this or any previous visit (from the past 240 hours).   Labs: Basic Metabolic Panel: Recent Labs  Lab 08/26/23 0424  NA 140  K 4.1  CL 107  CO2 22  GLUCOSE 88  BUN 33*  CREATININE 0.54*  CALCIUM  9.7  MG 1.9  PHOS 5.3*   Liver Function Tests: Recent Labs  Lab 08/26/23 0424  AST 32  ALT 30  ALKPHOS 76  BILITOT 0.6  PROT 7.3  ALBUMIN  3.4*   No results for input(s): LIPASE, AMYLASE in the last 168 hours. No results for input(s): AMMONIA in the last  168 hours. CBC: Recent Labs  Lab 08/26/23 0424  WBC 3.9*  NEUTROABS 1.2*  HGB 10.2*  HCT 32.5*  MCV 102.2*  PLT 261   Cardiac Enzymes: No results for input(s): CKTOTAL, CKMB, CKMBINDEX, TROPONINI in the last 168 hours. BNP: BNP (last 3 results) Recent Labs    03/06/23 0529 07/07/23 1955 07/12/23 1801  BNP 773.6* 553.5* 754.3*    ProBNP (last 3 results) No results for input(s): PROBNP in the last 8760 hours.  CBG: No results for input(s): GLUCAP in the last 168 hours.     Signed:  Meliton Monte MD.  [facility] 08/27/2023, 12:03 PM

## 2023-08-27 NOTE — Plan of Care (Signed)
  Problem: Education: Goal: Knowledge of General Education information will improve Description: Including pain rating scale, medication(s)/side effects and non-pharmacologic comfort measures Outcome: Adequate for Discharge   Problem: Health Behavior/Discharge Planning: Goal: Ability to manage health-related needs will improve Outcome: Adequate for Discharge   Problem: Clinical Measurements: Goal: Ability to maintain clinical measurements within normal limits will improve Outcome: Adequate for Discharge Goal: Will remain free from infection Outcome: Adequate for Discharge Goal: Diagnostic test results will improve Outcome: Adequate for Discharge Goal: Respiratory complications will improve Outcome: Adequate for Discharge Goal: Cardiovascular complication will be avoided Outcome: Adequate for Discharge   Problem: Activity: Goal: Risk for activity intolerance will decrease Outcome: Adequate for Discharge   Problem: Nutrition: Goal: Adequate nutrition will be maintained Outcome: Adequate for Discharge   Problem: Coping: Goal: Level of anxiety will decrease Outcome: Adequate for Discharge   Problem: Elimination: Goal: Will not experience complications related to bowel motility Outcome: Adequate for Discharge Goal: Will not experience complications related to urinary retention Outcome: Adequate for Discharge   Problem: Pain Management: Goal: General experience of comfort will improve Outcome: Adequate for Discharge   Problem: Safety: Goal: Ability to remain free from injury will improve Outcome: Adequate for Discharge   Problem: Skin Integrity: Goal: Risk for impaired skin integrity will decrease Outcome: Adequate for Discharge   Problem: Safety: Goal: Violent Restraint(s) Outcome: Adequate for Discharge   Problem: Safety: Goal: Non-violent Restraint(s) Outcome: Adequate for Discharge

## 2023-08-27 NOTE — TOC Transition Note (Signed)
 Transition of Care [facility]) - Discharge Note   Patient Details  Name: Anthony A Nagorski Jr. MRN: [id number] Date of Birth: July 24, 1955  [facility] CM/SW Contact:  Tawni CHRISTELLA Eva, LCSW Phone Number: 08/27/2023, 11:34 AM   Clinical Narrative:     Pt requesting to leave this morning to get to the bank before it closes. The pt was informed that he needs to meet the boarding home owner around 6 p.m. The pt verbalized understanding. CSW provided the pt with two bus passes as requested. No further TOC needs, TOC sign off.   Final next level of care: Home/Self Care Barriers to Discharge: Barriers Resolved   Patient Goals and CMS Choice Patient states their goals for this hospitalization and ongoing recovery are:: Borading home CMS Medicare.gov Compare Post Acute Care list provided to:: Patient Choice offered to / list presented to : Patient      Discharge Placement                       Discharge Plan and Services Additional resources added to the After Visit Summary for                                       Social Drivers of Health (SDOH) Interventions SDOH Screenings   Food Insecurity: Patient Unable To Answer (07/28/2023)  Recent Concern: Food Insecurity - Food Insecurity Present (07/08/2023)  Housing: Patient Unable To Answer (07/28/2023)  Recent Concern: Housing - High Risk (07/08/2023)  Transportation Needs: Patient Unable To Answer (07/28/2023)  Recent Concern: Transportation Needs - Unmet Transportation Needs (07/08/2023)  Utilities: Patient Unable To Answer (07/28/2023)  Recent Concern: Utilities - At Risk (07/08/2023)  Depression (PHQ2-9): Medium Risk (10/16/2020)  Social Connections: Patient Unable To Answer (07/28/2023)  Tobacco Use: High Risk (07/28/2023)     Readmission Risk Interventions    08/19/2023    9:17 AM 07/12/2023    9:58 AM 02/08/2023    5:25 PM  Readmission Risk Prevention Plan  Transportation Screening Complete Complete  Complete  Medication Review Oceanographer) Complete Complete Complete  PCP or Specialist appointment within 3-5 days of discharge  -- Complete  HRI or Home Care Consult Complete Complete Complete  SW Recovery Care/Counseling Consult Complete Complete Complete  Palliative Care Screening Not Applicable Not Applicable Not Applicable  Skilled Nursing Facility Not Applicable Not Applicable Complete

## 2023-09-06 ENCOUNTER — Other Ambulatory Visit (HOSPITAL_COMMUNITY): Payer: Self-pay

## 2023-11-22 ENCOUNTER — Ambulatory Visit (HOSPITAL_COMMUNITY)
Admission: EM | Admit: 2023-11-22 | Discharge: 2023-11-22 | Disposition: A | Discharge status: Home / Self Care | Attending: Psychiatry | Admitting: Psychiatry

## 2023-11-22 DIAGNOSIS — G4709 Other insomnia: Secondary | ICD-10-CM | POA: Insufficient documentation

## 2023-11-22 DIAGNOSIS — L299 Pruritus, unspecified: Secondary | ICD-10-CM | POA: Insufficient documentation

## 2023-11-22 DIAGNOSIS — Z5900 Homelessness unspecified: Secondary | ICD-10-CM | POA: Diagnosis not present

## 2023-11-22 MED ORDER — HYDROXYZINE HCL 25 MG PO TABS
25.0000 mg | ORAL_TABLET | ORAL | Status: AC
  Administered 2023-11-22: 25 mg via ORAL
  Filled 2023-11-22: qty 1

## 2023-11-22 NOTE — Progress Notes (Signed)
   11/22/23 1937  [facility] Triage Screening (Walk-ins at [facility] only)  How Did You Hear About Us ? Legal System  What Is the Reason for Your Visit/Call Today? Pt unable to identify why he was brought in, but reports that he was picked up by [employer] at [employer] [place]. Pt has history of alcohol  use disorder. Pt denies SI,HI,AVH and substance/alcohol  use.  How Long Has This Been Causing You Problems? <Week  Have You Recently Had Any Thoughts About Hurting Yourself? No  Are You Planning to Commit Suicide/Harm Yourself At This time? No  Have you Recently Had Thoughts About Hurting Someone Marigene Shoulder? No  Are You Planning To Harm Someone At This Time? No  Physical Abuse Denies  Verbal Abuse Denies  Sexual Abuse Denies  Exploitation of patient/patient's resources Denies  Self-Neglect Denies  Are you currently experiencing any auditory, visual or other hallucinations? No  Have You Used Any Alcohol  or Drugs in the Past 24 Hours? No  Do you have any current medical co-morbidities that require immediate attention? No  Clinician description of patient physical appearance/behavior: Pt has difficulty hearing, but is able to answer some questions posed  What Do You Feel Would Help You the Most Today? Treatment for Depression or other mood problem  If access to [facility] was not available, would you have sought care in the Emergency Department? No  Determination of Need Urgent (48 hours)  Options For Referral Other: Comment;Outpatient Therapy;Medication Management;BH Urgent Care  Determination of Need filed? Yes
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
## 2023-11-22 NOTE — ED Notes (Signed)
 Patient discharge via ambulatory with a steady gait with [employer] transporting home. Patient denies SI/HI and AVH.  Respirations equal and unlabored, skin warm and dry. No acute distress noted.

## 2023-11-22 NOTE — ED Notes (Signed)
 [employer] called and stating they have no vehicles available at present, but are aware pt needs return to place of residence.

## 2023-11-22 NOTE — ED Notes (Signed)
 [employer] Dis[employer] called to return pt to place of residence, [address] [place], [place].  Pt remains contained in [address].  Valuables bagged and placed in [place].

## 2023-12-05 ENCOUNTER — Emergency Department (HOSPITAL_COMMUNITY)
Admission: EM | Admit: 2023-12-05 | Discharge: 2023-12-06 | Disposition: A | Discharge status: Home / Self Care | Source: Home / Self Care | Attending: Emergency Medicine | Admitting: Emergency Medicine

## 2023-12-05 ENCOUNTER — Encounter (HOSPITAL_COMMUNITY): Payer: Self-pay

## 2023-12-05 ENCOUNTER — Other Ambulatory Visit: Payer: Self-pay

## 2023-12-05 DIAGNOSIS — F10120 Alcohol abuse with intoxication, uncomplicated: Secondary | ICD-10-CM | POA: Insufficient documentation

## 2023-12-05 DIAGNOSIS — Z7901 Long term (current) use of anticoagulants: Secondary | ICD-10-CM | POA: Insufficient documentation

## 2023-12-05 DIAGNOSIS — F10920 Alcohol use, unspecified with intoxication, uncomplicated: Secondary | ICD-10-CM

## 2023-12-05 DIAGNOSIS — F10131 Alcohol abuse with withdrawal delirium: Secondary | ICD-10-CM | POA: Diagnosis not present

## 2023-12-05 DIAGNOSIS — E876 Hypokalemia: Secondary | ICD-10-CM | POA: Insufficient documentation

## 2023-12-05 DIAGNOSIS — Y907 Blood alcohol level of 200-239 mg/100 ml: Secondary | ICD-10-CM | POA: Insufficient documentation

## 2023-12-05 DIAGNOSIS — F10939 Alcohol use, unspecified with withdrawal, unspecified: Secondary | ICD-10-CM | POA: Diagnosis not present

## 2023-12-05 NOTE — ED Provider Notes (Signed)
 [place] EMERGENCY DEPARTMENT AT [facility] Provider Note   CSN: [id number] Arrival date & time: 12/05/23  2124     History  No chief complaint on file.   Anthony [name]. is a 68 y.o. male with history of alcohol  abuse, homelessness, A-fib with RVR, CVA, noncompliance with medications who presents via EMS from a local bar.  He is clearly intoxicated, requesting detox from alcohol  and also reporting generalized weakness feeling that he cannot walk even a block down the road without being fatigued.  This history of DTs in the past.  Echo from December 2024 with EF of 45 to 50%.  Will 5 caveat due to intoxication.  HPI     Home Medications Prior to Admission medications   Medication Sig Start Date End Date Taking? Authorizing Provider  acetaminophen  (TYLENOL ) 500 MG tablet Take 1 tablet (500 mg total) by mouth every 8 (eight) hours as needed. Patient taking differently: Take 1,000 mg by mouth every 6 (six) hours as needed (for pain). 10/13/22   Gherghe, Costin M, MD  apixaban  (ELIQUIS ) 5 MG TABS tablet Take 1 tablet (5 mg total) by mouth 2 (two) times daily. 08/27/23 10/26/23  Etter Hermann., MD  ferrous sulfate  325 (65 FE) MG tablet Take 1 tablet (325 mg total) by mouth daily with breakfast. 07/12/23 08/11/23  Danford, Willis Harter, MD  folic acid  (FOLVITE ) 1 MG tablet Take 1 tablet (1 mg total) by mouth daily. 07/12/23   Danford, Willis Harter, MD  magnesium  oxide (MAG-OX) 400 (240 Mg) MG tablet Take 1 tablet (400 mg total) by mouth 2 (two) times daily. 02/16/23   Singh, Prashant K, MD  metoprolol  succinate (TOPROL -XL) 25 MG 24 hr tablet Take 1 tablet (25 mg total) by mouth daily. 08/28/23 10/27/23  Etter Hermann., MD  mometasone -formoterol  (DULERA ) 200-5 MCG/ACT AERO Inhale 2 puffs into the lungs 2 (two) times daily. 02/16/23   Singh, Prashant K, MD  OLANZapine  (ZYPREXA ) 2.5 MG tablet Take 1 tablet (2.5 mg total) by mouth daily after lunch. 08/27/23 10/26/23  Etter Hermann., MD  OLANZapine  (ZYPREXA ) 5 MG tablet Take 1 tablet (5 mg total) by mouth at bedtime. 08/27/23   Etter Hermann., MD  pantoprazole  (PROTONIX ) 40 MG tablet Take 1 tablet (40 mg total) by mouth daily. 02/17/23   Singh, Prashant K, MD  rosuvastatin  (CRESTOR ) 10 MG tablet Take 1 tablet (10 mg total) by mouth daily. 07/12/23 10/10/23  DanfordWillis Harter, MD  thiamine  (VITAMIN B1) 100 MG tablet Take 1 tablet (100 mg total) by mouth daily. 07/12/23   Danford, Willis Harter, MD      Allergies    Other and Poison ivy extract    Review of Systems   Review of Systems  Neurological:  Positive for weakness.       EtOH intoxication and requesting detox.    Physical Exam Updated Vital Signs BP 104/74   Pulse 88   Temp 98.4 F (36.9 C) (Oral)   Resp 19   SpO2 100%  Physical Exam Vitals and nursing note reviewed.  Constitutional:      Appearance: He is not toxic-appearing.     Comments: Chronically ill-appearing, poor hygiene.  HENT:     Head: Normocephalic and atraumatic.     Mouth/Throat:     Mouth: Mucous membranes are moist.     Pharynx: No oropharyngeal exudate or posterior oropharyngeal erythema.  Eyes:     General: Lids are  normal. Vision grossly intact.        Right eye: No discharge.        Left eye: No discharge.     Conjunctiva/sclera: Conjunctivae normal.     Pupils: Pupils are equal, round, and reactive to light.  Cardiovascular:     Rate and Rhythm: Tachycardia present. Rhythm irregularly irregular.     Pulses: Normal pulses.  Pulmonary:     Effort: Pulmonary effort is normal. No respiratory distress.     Breath sounds: Normal breath sounds. No wheezing or rales.  Abdominal:     General: Bowel sounds are normal. There is no distension.     Tenderness: There is no abdominal tenderness.  Musculoskeletal:        General: No deformity.     Cervical back: Neck supple.     Right lower leg: 3+ Pitting Edema present.     Left lower leg: 3+ Pitting Edema  present.  Skin:    General: Skin is warm and dry.  Neurological:     Mental Status: He is alert.     Comments: ETOH intoxication  Psychiatric:        Mood and Affect: Mood normal.     ED Results / Procedures / Treatments   Labs (all labs ordered are listed, but only abnormal results are displayed) Labs Reviewed  CBC WITH DIFFERENTIAL/PLATELET - Abnormal; Notable for the following components:      Result Value   RBC 2.85 (*)    Hemoglobin 8.4 (*)    HCT 27.4 (*)    RDW 19.2 (*)    All other components within normal limits  COMPREHENSIVE METABOLIC PANEL WITH GFR - Abnormal; Notable for the following components:   Sodium 132 (*)    Potassium 3.0 (*)    CO2 16 (*)    Calcium  8.3 (*)    AST 68 (*)    Alkaline Phosphatase 136 (*)    All other components within normal limits  SALICYLATE LEVEL - Abnormal; Notable for the following components:   Salicylate Lvl <7.0 (*)    All other components within normal limits  BRAIN NATRIURETIC PEPTIDE - Abnormal; Notable for the following components:   B Natriuretic Peptide 587.5 (*)    All other components within normal limits  ETHANOL - Abnormal; Notable for the following components:   Alcohol , Ethyl (B) 221 (*)    All other components within normal limits  PROTIME-INR - Abnormal; Notable for the following components:   Prothrombin Time 15.7 (*)    All other components within normal limits  MAGNESIUM  - Abnormal; Notable for the following components:   Magnesium  1.4 (*)    All other components within normal limits  ACETAMINOPHEN  LEVEL  RAPID URINE DRUG SCREEN, HOSP PERFORMED    EKG EKG Interpretation Date/Time:  Monday Dec 06 2023 00:24:04 EDT Ventricular Rate:  107 PR Interval:    QRS Duration:  110 QT Interval:  392 QTC Calculation: 523 R Axis:   95  Text Interpretation: Atrial fibrillation Right axis deviation Consider left ventricular hypertrophy Nonspecific T abnrm, anterolateral leads Prolonged QT interval No acute  changes Confirmed by Ballard Bongo [id number]) on 12/06/2023 12:27:15 AM  Radiology DG Chest Portable 1 View Result Date: 12/06/2023 CLINICAL DATA:  Shortness of breath EXAM: PORTABLE CHEST 1 VIEW COMPARISON:  08/01/2023 FINDINGS: Heart and mediastinal contours are within normal limits. No focal opacities or effusions. No acute bony abnormality. IMPRESSION: No active disease. Electronically Signed   By: Janeece Mechanic  M.D.   On: 12/06/2023 01:01    Procedures Procedures    Medications Ordered in ED Medications  sodium chloride  0.9 % bolus 1,000 mL (0 mLs Intravenous Stopped 12/06/23 0420)  thiamine  (VITAMIN B1) injection 100 mg (100 mg Intravenous Given 12/06/23 0107)  potassium chloride  SA (KLOR-CON  M) CR tablet 60 mEq (60 mEq Oral Given 12/06/23 0238)  magnesium  sulfate IVPB 2 g 50 mL (0 g Intravenous Stopped 12/06/23 2956)    ED Course/ Medical Decision Making/ A&P                                 Medical Decision Making 68 year old male multiple comorbidities and history of alcohol  abuse who presents requesting detox.  Tachycardic with irregularly irregular rhythm at time of my evaluation heart rate in the 120s.  Vital signs are otherwise normal.  Patient appears intoxicated, smells of alcohol .  Poor personal hygiene.  Abdominal exam is benign.  GCS is 15, ambulatory in the ED.  Amount and/or Complexity of Data Reviewed Labs: ordered.    Details: CBC with anemiaat baseline, CMP with hypokalemia and hyponatremia both repleted in the ED.  BNP elevated though near patient's baseline.  UDS negative, alcohol  221.  Mag low but repleted in the ED.   Radiology: ordered.  Risk Prescription drug management.   Patient allowed to metabolize, was able to ambulate in the ED without difficulty.  Declined transfer to [facility] for detox.  Clinical concern for emergent underlying etiology with patient symptoms that would warrant further ED workup and patient management is exceedingly low.  Anthony [name]   voiced understanding of his medical evaluation and treatment plan. Each of their questions answered to their expressed satisfaction.    This chart was dictated using voice recognition software, Dragon. Despite the best efforts of this provider to proofread and correct errors, errors may still occur which can change documentation meaning.         Final Clinical Impression(s) / ED Diagnoses Final diagnoses:  Alcoholic intoxication without complication (HCC)  Hypokalemia  Hypomagnesemia    Rx / DC Orders ED Discharge Orders     None         Kae Oram, PA-C 12/06/23 0716    Ballard Bongo, MD 12/08/23 [phone]

## 2023-12-05 NOTE — ED Triage Notes (Signed)
 Pt [place] EMS from local bar and reports wanting help with detoxing from alcohol . Pt reports feeling weak.

## 2023-12-06 ENCOUNTER — Emergency Department (HOSPITAL_COMMUNITY)

## 2023-12-06 ENCOUNTER — Ambulatory Visit: Payer: Self-pay

## 2023-12-06 ENCOUNTER — Emergency Department (HOSPITAL_COMMUNITY)
Admission: EM | Admit: 2023-12-06 | Discharge: 2023-12-06 | Disposition: A | Discharge status: Home / Self Care | Source: Home / Self Care | Attending: Emergency Medicine | Admitting: Emergency Medicine

## 2023-12-06 DIAGNOSIS — Z7901 Long term (current) use of anticoagulants: Secondary | ICD-10-CM | POA: Insufficient documentation

## 2023-12-06 DIAGNOSIS — I10 Essential (primary) hypertension: Secondary | ICD-10-CM | POA: Insufficient documentation

## 2023-12-06 DIAGNOSIS — Z765 Malingerer [conscious simulation]: Secondary | ICD-10-CM | POA: Insufficient documentation

## 2023-12-06 DIAGNOSIS — F10129 Alcohol abuse with intoxication, unspecified: Secondary | ICD-10-CM | POA: Insufficient documentation

## 2023-12-06 DIAGNOSIS — R1110 Vomiting, unspecified: Secondary | ICD-10-CM | POA: Insufficient documentation

## 2023-12-06 DIAGNOSIS — R0602 Shortness of breath: Secondary | ICD-10-CM | POA: Insufficient documentation

## 2023-12-06 DIAGNOSIS — F10920 Alcohol use, unspecified with intoxication, uncomplicated: Secondary | ICD-10-CM

## 2023-12-06 DIAGNOSIS — Z79899 Other long term (current) drug therapy: Secondary | ICD-10-CM | POA: Insufficient documentation

## 2023-12-06 DIAGNOSIS — Z5900 Homelessness unspecified: Secondary | ICD-10-CM | POA: Insufficient documentation

## 2023-12-06 LAB — COMPREHENSIVE METABOLIC PANEL WITH GFR
ALT: 34 U/L (ref 0–44)
ALT: 31 U/L (ref 0–44)
AST: 68 U/L — ABNORMAL HIGH (ref 15–41)
AST: 73 U/L — ABNORMAL HIGH (ref 15–41)
Albumin: 3.6 g/dL (ref 3.5–5.0)
Albumin: 3.3 g/dL — ABNORMAL LOW (ref 3.5–5.0)
Alkaline Phosphatase: 136 U/L — ABNORMAL HIGH (ref 38–126)
Alkaline Phosphatase: 104 U/L (ref 38–126)
Anion gap: 14 (ref 5–15)
Anion gap: 12 (ref 5–15)
BUN: 14 mg/dL (ref 8–23)
BUN: 10 mg/dL (ref 8–23)
CO2: 16 mmol/L — ABNORMAL LOW (ref 22–32)
CO2: 16 mmol/L — ABNORMAL LOW (ref 22–32)
Calcium: 8.3 mg/dL — ABNORMAL LOW (ref 8.9–10.3)
Calcium: 8.3 mg/dL — ABNORMAL LOW (ref 8.9–10.3)
Chloride: 102 mmol/L (ref 98–111)
Chloride: 110 mmol/L (ref 98–111)
Creatinine, Ser: 0.65 mg/dL (ref 0.61–1.24)
Creatinine, Ser: 0.67 mg/dL (ref 0.61–1.24)
GFR, Estimated: >60 mL/min (ref >60)
GFR, Estimated: >60 mL/min (ref >60)
Glucose, Bld: 86 mg/dL (ref 70–99)
Glucose, Bld: 88 mg/dL (ref 70–99)
Potassium: 3 mmol/L — ABNORMAL LOW (ref 3.5–5.1)
Potassium: 3.5 mmol/L (ref 3.5–5.1)
Sodium: 132 mmol/L — ABNORMAL LOW (ref 135–145)
Sodium: 138 mmol/L (ref 135–145)
Total Bilirubin: 0.8 mg/dL (ref 0.0–1.2)
Total Bilirubin: 0.5 mg/dL (ref 0.0–1.2)
Total Protein: 7.4 g/dL (ref 6.5–8.1)
Total Protein: 7.1 g/dL (ref 6.5–8.1)

## 2023-12-06 LAB — CBC WITH DIFFERENTIAL/PLATELET
Abs Immature Granulocytes: 0.02 10*3/uL (ref 0.00–0.07)
Abs Immature Granulocytes: 0.02 10*3/uL (ref 0.00–0.07)
Basophils Absolute: 0.1 10*3/uL (ref 0.0–0.1)
Basophils Absolute: 0.1 10*3/uL (ref 0.0–0.1)
Basophils Relative: 2 %
Basophils Relative: 2 %
Eosinophils Absolute: 0.1 10*3/uL (ref 0.0–0.5)
Eosinophils Absolute: 0.1 10*3/uL (ref 0.0–0.5)
Eosinophils Relative: 2 %
Eosinophils Relative: 3 %
HCT: 27.2 % — ABNORMAL LOW (ref 39.0–52.0)
HCT: 27.4 % — ABNORMAL LOW (ref 39.0–52.0)
Hemoglobin: 8.4 g/dL — ABNORMAL LOW (ref 13.0–17.0)
Hemoglobin: 8.4 g/dL — ABNORMAL LOW (ref 13.0–17.0)
Immature Granulocytes: 0 %
Immature Granulocytes: 1 %
Lymphocytes Relative: 33 %
Lymphocytes Relative: 37 %
Lymphs Abs: 1.5 10*3/uL (ref 0.7–4.0)
Lymphs Abs: 1.8 10*3/uL (ref 0.7–4.0)
MCH: 29.5 pg (ref 26.0–34.0)
MCH: 29.9 pg (ref 26.0–34.0)
MCHC: 30.7 g/dL (ref 30.0–36.0)
MCHC: 30.9 g/dL (ref 30.0–36.0)
MCV: 96.1 fL (ref 80.0–100.0)
MCV: 96.8 fL (ref 80.0–100.0)
Monocytes Absolute: 0.6 10*3/uL (ref 0.1–1.0)
Monocytes Absolute: 0.6 10*3/uL (ref 0.1–1.0)
Monocytes Relative: 12 %
Monocytes Relative: 14 %
Neutro Abs: 2.2 10*3/uL (ref 1.7–7.7)
Neutro Abs: 2.2 10*3/uL (ref 1.7–7.7)
Neutrophils Relative %: 46 %
Neutrophils Relative %: 48 %
Platelets: 255 10*3/uL (ref 150–400)
Platelets: 262 10*3/uL (ref 150–400)
RBC: 2.81 MIL/uL — ABNORMAL LOW (ref 4.22–5.81)
RBC: 2.85 MIL/uL — ABNORMAL LOW (ref 4.22–5.81)
RDW: 19.2 % — ABNORMAL HIGH (ref 11.5–15.5)
RDW: 19.4 % — ABNORMAL HIGH (ref 11.5–15.5)
WBC: 4.4 10*3/uL (ref 4.0–10.5)
WBC: 4.8 10*3/uL (ref 4.0–10.5)
nRBC: 0 % (ref 0.0–0.2)
nRBC: 0 % (ref 0.0–0.2)

## 2023-12-06 LAB — SALICYLATE LEVEL: Salicylate Lvl: <7 mg/dL — ABNORMAL LOW (ref 7.0–30.0)

## 2023-12-06 LAB — BRAIN NATRIURETIC PEPTIDE
B Natriuretic Peptide: 587.5 pg/mL — ABNORMAL HIGH (ref 0.0–100.0)
B Natriuretic Peptide: 377.5 pg/mL — ABNORMAL HIGH (ref 0.0–100.0)

## 2023-12-06 LAB — RAPID URINE DRUG SCREEN, HOSP PERFORMED
Amphetamines: NOT DETECTED
Barbiturates: NOT DETECTED
Benzodiazepines: NOT DETECTED
Cocaine: NOT DETECTED
Opiates: NOT DETECTED
Tetrahydrocannabinol: NOT DETECTED

## 2023-12-06 LAB — PROTIME-INR
INR: 1.2 (ref 0.8–1.2)
Prothrombin Time: 15.7 s — ABNORMAL HIGH (ref 11.4–15.2)

## 2023-12-06 LAB — ETHANOL: Alcohol, Ethyl (B): 221 mg/dL — ABNORMAL HIGH (ref <15)

## 2023-12-06 LAB — ACETAMINOPHEN LEVEL: Acetaminophen (Tylenol), Serum: 11 ug/mL (ref 10–30)

## 2023-12-06 LAB — LIPASE, BLOOD: Lipase: 49 U/L (ref 11–51)

## 2023-12-06 LAB — MAGNESIUM: Magnesium: 1.4 mg/dL — ABNORMAL LOW (ref 1.7–2.4)

## 2023-12-06 MED ORDER — MAGNESIUM SULFATE 2 GM/50ML IV SOLN
2.0000 g | Freq: Once | INTRAVENOUS | Status: AC
  Administered 2023-12-06: 2 g via INTRAVENOUS
  Filled 2023-12-06: qty 50

## 2023-12-06 MED ORDER — ONDANSETRON 4 MG PO TBDP
4.0000 mg | ORAL_TABLET | Freq: Once | ORAL | Status: AC
  Administered 2023-12-06: 4 mg via ORAL
  Filled 2023-12-06: qty 1

## 2023-12-06 MED ORDER — THIAMINE HCL 100 MG/ML IJ SOLN
100.0000 mg | Freq: Once | INTRAMUSCULAR | Status: AC
  Administered 2023-12-06: 100 mg via INTRAVENOUS
  Filled 2023-12-06: qty 2

## 2023-12-06 MED ORDER — SODIUM CHLORIDE 0.9 % IV BOLUS
1000.0000 mL | Freq: Once | INTRAVENOUS | Status: AC
  Administered 2023-12-06: 1000 mL via INTRAVENOUS

## 2023-12-06 MED ORDER — POTASSIUM CHLORIDE CRYS ER 20 MEQ PO TBCR
60.0000 meq | EXTENDED_RELEASE_TABLET | Freq: Once | ORAL | Status: AC
  Administered 2023-12-06: 60 meq via ORAL
  Filled 2023-12-06: qty 3

## 2023-12-06 NOTE — ED Notes (Signed)
 Pt demanding 'take this stuff off me so I can get up' but refuses to articulate further. Explained treatment, pt continues to be verbally aggressive to this RN. When asked where he wants to go, pt states 'none of your fucking business'. PA aware

## 2023-12-06 NOTE — ED Provider Triage Note (Signed)
 Emergency Medicine Provider Triage Evaluation Note  Anthony A Dimino Jr. , a 68 y.o. male  was evaluated in triage.  Pt complains of alcohol  intoxication. EMS reports that they were called for vomiting, however patient has not vomited and was not complaining of vomiting. Denies abdominal pain. Does smell intoxicated. He can not tell me a reason he came to the ER. Patient reports that he has been drinking liquor all day.   Review of Systems  Positive:  Negative:   Physical Exam  BP (!) 123/92 (BP Location: Right Arm)   Pulse 70   Temp 98.1 F (36.7 C) (Oral)   Resp 15   SpO2 94%  Gen:   Awake, no distress   Resp:  Normal effort  MSK:   Moves extremities without difficulty  Other:  Abdomen soft and non tender. Patient is on cell phone. No trembling.  Medical Decision Making  Medically screening exam initiated at 9:08 PM.  Appropriate orders placed.  Anthony A Zukowski Jr. was informed that the remainder of the evaluation will be completed by another provider, this initial triage assessment does not replace that evaluation, and the importance of remaining in the ED until their evaluation is complete.  Labs ordered.    Spence Dux, [place] 12/06/23 2117

## 2023-12-06 NOTE — Discharge Instructions (Signed)
 Please follow-up with outpatient resources regarding substance abuse.  Please call their number in the morning to request space to detox.  If you have any new or worsening symptoms please return to the ED.
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
## 2023-12-06 NOTE — Discharge Instructions (Signed)
 Please follow up with the resources below for alcohol  detox.

## 2023-12-06 NOTE — ED Triage Notes (Signed)
 [place] [facility] [place] etoh. Patient did not report this to RN or PA, but was discharged from [facility] this morning for same. Patient currently talking on his phone in triage.

## 2023-12-06 NOTE — Telephone Encounter (Signed)
 X3 unsuccessful attempts made to reach pt: closing patient encounter.

## 2023-12-06 NOTE — Telephone Encounter (Signed)
 1st attempt unable to LVM - phone rang and then received message: "call canot be completed at this time"

## 2023-12-06 NOTE — Telephone Encounter (Signed)
 Patient called in stating he was awaiting a call today and did not receive one. RN Triage attempted three outbound attempts, but patient's phone was not available, stating 'call cannot be completed as dialed'. Patient states 'he is looking for help, as he is an alcoholic that has drank a lot of alcohol  for many years'.Patient unable to fully communicate his needs on the phone. This RN told patient that he does not have an established PCP in the system, but patient states his PCP is 'Son [facility] with emergency services'. This RN informed patient that pcp is not within our network but I would be happy to assist patient with finding a new pcp. Patient unable to direct attention towards conversation. Patient states he has had his shelter taken away and will be sleeping outside. Patient states 'I could be somewhere getting worked on now'. This RN advised patient I could assist him with getting help with a new pcp, but patient unable to state his needs. Patient keeps staying 'I need to be in somewhere who can.'. This RN gave patient the [facility] [phone] and [facility] [phone].

## 2023-12-06 NOTE — Telephone Encounter (Signed)
 Reason for Disposition  [1] Caller requests to speak ONLY to PCP AND [2] NON-URGENT question  Answer Assessment - Initial Assessment Questions 1. REASON FOR CALL or QUESTION: "What is your reason for calling today?" or "How can I best help you?" or "What question do you have that I can help answer?"     Please see notes. 2. CALLER: Document the source of call. (e.g., laboratory, patient).     Patient  Protocols used: PCP Call - No Triage-A-AH

## 2023-12-07 ENCOUNTER — Inpatient Hospital Stay (HOSPITAL_COMMUNITY)
Admission: EM | Admit: 2023-12-07 | Discharge: 2024-02-28 | DRG: 896 | Disposition: A | Discharge status: Skilled Nursing Facility | Attending: Internal Medicine | Admitting: Internal Medicine

## 2023-12-07 ENCOUNTER — Other Ambulatory Visit: Payer: Self-pay

## 2023-12-07 ENCOUNTER — Emergency Department (HOSPITAL_COMMUNITY)

## 2023-12-07 DIAGNOSIS — Z7951 Long term (current) use of inhaled steroids: Secondary | ICD-10-CM

## 2023-12-07 DIAGNOSIS — D539 Nutritional anemia, unspecified: Secondary | ICD-10-CM | POA: Diagnosis present

## 2023-12-07 DIAGNOSIS — F10930 Alcohol use, unspecified with withdrawal, uncomplicated: Secondary | ICD-10-CM

## 2023-12-07 DIAGNOSIS — K5900 Constipation, unspecified: Secondary | ICD-10-CM | POA: Diagnosis not present

## 2023-12-07 DIAGNOSIS — K683 Retroperitoneal hematoma: Secondary | ICD-10-CM | POA: Diagnosis not present

## 2023-12-07 DIAGNOSIS — E440 Moderate protein-calorie malnutrition: Secondary | ICD-10-CM | POA: Diagnosis present

## 2023-12-07 DIAGNOSIS — Z809 Family history of malignant neoplasm, unspecified: Secondary | ICD-10-CM

## 2023-12-07 DIAGNOSIS — Z5902 Unsheltered homelessness: Secondary | ICD-10-CM

## 2023-12-07 DIAGNOSIS — A419 Sepsis, unspecified organism: Secondary | ICD-10-CM | POA: Diagnosis not present

## 2023-12-07 DIAGNOSIS — I480 Paroxysmal atrial fibrillation: Secondary | ICD-10-CM | POA: Diagnosis present

## 2023-12-07 DIAGNOSIS — I2510 Atherosclerotic heart disease of native coronary artery without angina pectoris: Secondary | ICD-10-CM | POA: Diagnosis present

## 2023-12-07 DIAGNOSIS — Z6822 Body mass index (BMI) 22.0-22.9, adult: Secondary | ICD-10-CM

## 2023-12-07 DIAGNOSIS — F10121 Alcohol abuse with intoxication delirium: Secondary | ICD-10-CM | POA: Diagnosis present

## 2023-12-07 DIAGNOSIS — R627 Adult failure to thrive: Secondary | ICD-10-CM | POA: Diagnosis present

## 2023-12-07 DIAGNOSIS — I10 Essential (primary) hypertension: Secondary | ICD-10-CM | POA: Diagnosis present

## 2023-12-07 DIAGNOSIS — I110 Hypertensive heart disease with heart failure: Secondary | ICD-10-CM | POA: Diagnosis present

## 2023-12-07 DIAGNOSIS — Z8249 Family history of ischemic heart disease and other diseases of the circulatory system: Secondary | ICD-10-CM

## 2023-12-07 DIAGNOSIS — N3941 Urge incontinence: Secondary | ICD-10-CM | POA: Diagnosis present

## 2023-12-07 DIAGNOSIS — K449 Diaphragmatic hernia without obstruction or gangrene: Secondary | ICD-10-CM | POA: Diagnosis present

## 2023-12-07 DIAGNOSIS — F10120 Alcohol abuse with intoxication, uncomplicated: Secondary | ICD-10-CM | POA: Diagnosis present

## 2023-12-07 DIAGNOSIS — Z86711 Personal history of pulmonary embolism: Secondary | ICD-10-CM | POA: Diagnosis present

## 2023-12-07 DIAGNOSIS — K828 Other specified diseases of gallbladder: Secondary | ICD-10-CM | POA: Diagnosis present

## 2023-12-07 DIAGNOSIS — K761 Chronic passive congestion of liver: Secondary | ICD-10-CM | POA: Diagnosis present

## 2023-12-07 DIAGNOSIS — R6521 Severe sepsis with septic shock: Secondary | ICD-10-CM | POA: Diagnosis not present

## 2023-12-07 DIAGNOSIS — B9729 Other coronavirus as the cause of diseases classified elsewhere: Secondary | ICD-10-CM | POA: Diagnosis present

## 2023-12-07 DIAGNOSIS — Z888 Allergy status to other drugs, medicaments and biological substances status: Secondary | ICD-10-CM

## 2023-12-07 DIAGNOSIS — I5082 Biventricular heart failure: Secondary | ICD-10-CM | POA: Diagnosis not present

## 2023-12-07 DIAGNOSIS — Z79899 Other long term (current) drug therapy: Secondary | ICD-10-CM

## 2023-12-07 DIAGNOSIS — I700 Atherosclerosis of aorta: Secondary | ICD-10-CM | POA: Diagnosis present

## 2023-12-07 DIAGNOSIS — E872 Acidosis, unspecified: Secondary | ICD-10-CM | POA: Diagnosis present

## 2023-12-07 DIAGNOSIS — Z85828 Personal history of other malignant neoplasm of skin: Secondary | ICD-10-CM

## 2023-12-07 DIAGNOSIS — K760 Fatty (change of) liver, not elsewhere classified: Secondary | ICD-10-CM | POA: Diagnosis present

## 2023-12-07 DIAGNOSIS — J441 Chronic obstructive pulmonary disease with (acute) exacerbation: Secondary | ICD-10-CM | POA: Diagnosis not present

## 2023-12-07 DIAGNOSIS — L89893 Pressure ulcer of other site, stage 3: Secondary | ICD-10-CM | POA: Diagnosis present

## 2023-12-07 DIAGNOSIS — F411 Generalized anxiety disorder: Secondary | ICD-10-CM | POA: Diagnosis present

## 2023-12-07 DIAGNOSIS — K7040 Alcoholic hepatic failure without coma: Secondary | ICD-10-CM | POA: Diagnosis present

## 2023-12-07 DIAGNOSIS — E860 Dehydration: Secondary | ICD-10-CM | POA: Diagnosis present

## 2023-12-07 DIAGNOSIS — R197 Diarrhea, unspecified: Secondary | ICD-10-CM | POA: Diagnosis not present

## 2023-12-07 DIAGNOSIS — I4891 Unspecified atrial fibrillation: Secondary | ICD-10-CM | POA: Diagnosis present

## 2023-12-07 DIAGNOSIS — Z818 Family history of other mental and behavioral disorders: Secondary | ICD-10-CM

## 2023-12-07 DIAGNOSIS — K7030 Alcoholic cirrhosis of liver without ascites: Secondary | ICD-10-CM | POA: Diagnosis present

## 2023-12-07 DIAGNOSIS — D500 Iron deficiency anemia secondary to blood loss (chronic): Secondary | ICD-10-CM | POA: Diagnosis present

## 2023-12-07 DIAGNOSIS — F05 Delirium due to known physiological condition: Secondary | ICD-10-CM | POA: Diagnosis not present

## 2023-12-07 DIAGNOSIS — F10939 Alcohol use, unspecified with withdrawal, unspecified: Secondary | ICD-10-CM | POA: Diagnosis present

## 2023-12-07 DIAGNOSIS — E876 Hypokalemia: Secondary | ICD-10-CM | POA: Diagnosis present

## 2023-12-07 DIAGNOSIS — K7682 Hepatic encephalopathy: Secondary | ICD-10-CM | POA: Diagnosis not present

## 2023-12-07 DIAGNOSIS — I951 Orthostatic hypotension: Secondary | ICD-10-CM | POA: Diagnosis not present

## 2023-12-07 DIAGNOSIS — F1024 Alcohol dependence with alcohol-induced mood disorder: Secondary | ICD-10-CM | POA: Diagnosis present

## 2023-12-07 DIAGNOSIS — I5022 Chronic systolic (congestive) heart failure: Secondary | ICD-10-CM | POA: Diagnosis present

## 2023-12-07 DIAGNOSIS — G8929 Other chronic pain: Secondary | ICD-10-CM | POA: Diagnosis present

## 2023-12-07 DIAGNOSIS — F10229 Alcohol dependence with intoxication, unspecified: Secondary | ICD-10-CM | POA: Diagnosis present

## 2023-12-07 DIAGNOSIS — Z823 Family history of stroke: Secondary | ICD-10-CM

## 2023-12-07 DIAGNOSIS — F10231 Alcohol dependence with withdrawal delirium: Principal | ICD-10-CM | POA: Diagnosis present

## 2023-12-07 DIAGNOSIS — L299 Pruritus, unspecified: Secondary | ICD-10-CM | POA: Diagnosis not present

## 2023-12-07 DIAGNOSIS — F1014 Alcohol abuse with alcohol-induced mood disorder: Secondary | ICD-10-CM | POA: Diagnosis present

## 2023-12-07 DIAGNOSIS — K8020 Calculus of gallbladder without cholecystitis without obstruction: Secondary | ICD-10-CM | POA: Diagnosis present

## 2023-12-07 DIAGNOSIS — Z66 Do not resuscitate: Secondary | ICD-10-CM | POA: Diagnosis present

## 2023-12-07 DIAGNOSIS — F10131 Alcohol abuse with withdrawal delirium: Principal | ICD-10-CM | POA: Diagnosis present

## 2023-12-07 DIAGNOSIS — Z91148 Patient's other noncompliance with medication regimen for other reason: Secondary | ICD-10-CM

## 2023-12-07 DIAGNOSIS — F17220 Nicotine dependence, chewing tobacco, uncomplicated: Secondary | ICD-10-CM | POA: Diagnosis present

## 2023-12-07 DIAGNOSIS — Z515 Encounter for palliative care: Secondary | ICD-10-CM

## 2023-12-07 DIAGNOSIS — F10931 Alcohol use, unspecified with withdrawal delirium: Secondary | ICD-10-CM | POA: Diagnosis present

## 2023-12-07 DIAGNOSIS — I4819 Other persistent atrial fibrillation: Secondary | ICD-10-CM | POA: Diagnosis present

## 2023-12-07 DIAGNOSIS — Y902 Blood alcohol level of 40-59 mg/100 ml: Secondary | ICD-10-CM | POA: Diagnosis present

## 2023-12-07 DIAGNOSIS — R339 Retention of urine, unspecified: Secondary | ICD-10-CM | POA: Diagnosis present

## 2023-12-07 DIAGNOSIS — Z765 Malingerer [conscious simulation]: Secondary | ICD-10-CM

## 2023-12-07 DIAGNOSIS — G3184 Mild cognitive impairment, so stated: Secondary | ICD-10-CM | POA: Diagnosis present

## 2023-12-07 DIAGNOSIS — R5381 Other malaise: Secondary | ICD-10-CM | POA: Diagnosis present

## 2023-12-07 DIAGNOSIS — E871 Hypo-osmolality and hyponatremia: Secondary | ICD-10-CM | POA: Diagnosis present

## 2023-12-07 DIAGNOSIS — Z7901 Long term (current) use of anticoagulants: Secondary | ICD-10-CM

## 2023-12-07 DIAGNOSIS — K7010 Alcoholic hepatitis without ascites: Secondary | ICD-10-CM | POA: Diagnosis present

## 2023-12-07 DIAGNOSIS — R739 Hyperglycemia, unspecified: Secondary | ICD-10-CM | POA: Diagnosis not present

## 2023-12-07 DIAGNOSIS — R0602 Shortness of breath: Principal | ICD-10-CM

## 2023-12-07 DIAGNOSIS — F329 Major depressive disorder, single episode, unspecified: Secondary | ICD-10-CM | POA: Diagnosis present

## 2023-12-07 DIAGNOSIS — G9341 Metabolic encephalopathy: Secondary | ICD-10-CM | POA: Diagnosis not present

## 2023-12-07 DIAGNOSIS — E162 Hypoglycemia, unspecified: Secondary | ICD-10-CM | POA: Diagnosis present

## 2023-12-07 DIAGNOSIS — J9601 Acute respiratory failure with hypoxia: Secondary | ICD-10-CM | POA: Diagnosis not present

## 2023-12-07 DIAGNOSIS — Z781 Physical restraint status: Secondary | ICD-10-CM

## 2023-12-07 DIAGNOSIS — R410 Disorientation, unspecified: Secondary | ICD-10-CM | POA: Diagnosis present

## 2023-12-07 HISTORY — DX: Child sexual abuse, confirmed, initial encounter: T74.22XA

## 2023-12-07 LAB — ETHANOL: Alcohol, Ethyl (B): 57 mg/dL — ABNORMAL HIGH (ref <15)

## 2023-12-07 MED ORDER — LORAZEPAM 2 MG/ML IJ SOLN
0.0000 mg | Freq: Four times a day (QID) | INTRAMUSCULAR | Status: DC
  Administered 2023-12-07: 2 mg via INTRAVENOUS
  Filled 2023-12-07: qty 1

## 2023-12-07 MED ORDER — DEXTROSE-SODIUM CHLORIDE 5-0.9 % IV SOLN: INTRAVENOUS | Status: DC

## 2023-12-07 MED ORDER — LORAZEPAM 2 MG/ML IJ SOLN: 0.0000 mg | Freq: Two times a day (BID) | INTRAMUSCULAR | Status: DC

## 2023-12-07 MED ORDER — LORAZEPAM 1 MG PO TABS: 0.0000 mg | ORAL_TABLET | Freq: Four times a day (QID) | ORAL | Status: DC

## 2023-12-07 MED ORDER — THIAMINE HCL 100 MG/ML IJ SOLN: 100.0000 mg | Freq: Every day | INTRAMUSCULAR | Status: DC

## 2023-12-07 MED ORDER — LORAZEPAM 2 MG/ML IJ SOLN
1.0000 mg | Freq: Once | INTRAMUSCULAR | Status: AC
  Administered 2023-12-07: 1 mg via INTRAVENOUS
  Filled 2023-12-07: qty 1

## 2023-12-07 MED ORDER — THIAMINE MONONITRATE 100 MG PO TABS: 100.0000 mg | ORAL_TABLET | Freq: Every day | ORAL | Status: DC

## 2023-12-07 MED ORDER — LORAZEPAM 1 MG PO TABS: 0.0000 mg | ORAL_TABLET | Freq: Two times a day (BID) | ORAL | Status: DC

## 2023-12-07 NOTE — ED Notes (Signed)
 Pt reports SHOB and increased lower extremity swelling. Hasn't been taking any meds. Was able to stand and get off the EMS stretcher and into ours without issues. Denies fevers. Changed into a gown and placed on the monitor.

## 2023-12-07 NOTE — ED Triage Notes (Signed)
 Pt called EMS with [facility]. Also with decreased PO.

## 2023-12-07 NOTE — ED Notes (Signed)
 Pt reports feeling short of breath. Speaking in complete sentences without issues. Sats 100%.

## 2023-12-07 NOTE — ED Notes (Addendum)
 Pt a [place] agitated and requesting to leave. Disoriented to time and situation. Provider aware & aware of HR.

## 2023-12-07 NOTE — ED Notes (Signed)
CCMD notified

## 2023-12-07 NOTE — ED Provider Notes (Addendum)
 [place] EMERGENCY DEPARTMENT AT [facility] Provider Note   CSN: [id number] Arrival date & time: 12/07/23  1755     History  Chief Complaint  Patient presents with   Shortness of Breath    Anthony [name]. is a 68 y.o. male.  Patient seen frequently for alcohol  related problems either intoxication or requesting detox.  Also presenting frequently for homeless situation.  Patient seen just yesterday for alcohol  intoxication.  Patient was discharged had extensive lab workup including chest x-ray without acute findings.  His blood alcohol  level was 221.  Patient states he has had alcohol  since he left.  Patient is here today saying that he is short of breath but his oxygen levels are 100% on room air.  He is a Racz tachycardic with atrial fibrillation.  Heart rate in the 1 teens.  Patient is on Eliquis .  Past medical history sniffer hypertension the alcoholism the homelessness tobacco use disorder chronic low back pain.       Home Medications Prior to Admission medications   Medication Sig Start Date End Date Taking? Authorizing Provider  acetaminophen  (TYLENOL ) 500 MG tablet Take 1 tablet (500 mg total) by mouth every 8 (eight) hours as needed. Patient taking differently: Take 1,000 mg by mouth every 6 (six) hours as needed (for pain). 10/13/22   Gherghe, Costin M, MD  apixaban  (ELIQUIS ) 5 MG TABS tablet Take 1 tablet (5 mg total) by mouth 2 (two) times daily. 08/27/23 10/26/23  Etter Hermann., MD  ferrous sulfate  325 (65 FE) MG tablet Take 1 tablet (325 mg total) by mouth daily with breakfast. 07/12/23 08/11/23  Danford, Willis Harter, MD  folic acid  (FOLVITE ) 1 MG tablet Take 1 tablet (1 mg total) by mouth daily. 07/12/23   Danford, Willis Harter, MD  magnesium  oxide (MAG-OX) 400 (240 Mg) MG tablet Take 1 tablet (400 mg total) by mouth 2 (two) times daily. 02/16/23   Singh, Prashant K, MD  metoprolol  succinate (TOPROL -XL) 25 MG 24 hr tablet Take 1 tablet (25 mg total)  by mouth daily. 08/28/23 10/27/23  Etter Hermann., MD  mometasone -formoterol  (DULERA ) 200-5 MCG/ACT AERO Inhale 2 puffs into the lungs 2 (two) times daily. 02/16/23   Singh, Prashant K, MD  OLANZapine  (ZYPREXA ) 2.5 MG tablet Take 1 tablet (2.5 mg total) by mouth daily after lunch. 08/27/23 10/26/23  Etter Hermann., MD  OLANZapine  (ZYPREXA ) 5 MG tablet Take 1 tablet (5 mg total) by mouth at bedtime. 08/27/23   Etter Hermann., MD  pantoprazole  (PROTONIX ) 40 MG tablet Take 1 tablet (40 mg total) by mouth daily. 02/17/23   Singh, Prashant K, MD  rosuvastatin  (CRESTOR ) 10 MG tablet Take 1 tablet (10 mg total) by mouth daily. 07/12/23 10/10/23  DanfordWillis Harter, MD  thiamine  (VITAMIN B1) 100 MG tablet Take 1 tablet (100 mg total) by mouth daily. 07/12/23   Danford, Willis Harter, MD      Allergies    Other and Poison ivy extract    Review of Systems   Review of Systems  Constitutional:  Negative for chills and fever.  HENT:  Negative for ear pain and sore throat.   Eyes:  Negative for pain and visual disturbance.  Respiratory:  Positive for shortness of breath. Negative for cough.   Cardiovascular:  Negative for chest pain and palpitations.  Gastrointestinal:  Negative for abdominal pain and vomiting.  Genitourinary:  Negative for dysuria and hematuria.  Musculoskeletal:  Negative for  arthralgias and back pain.  Skin:  Negative for color change and rash.  Neurological:  Negative for seizures and syncope.  All other systems reviewed and are negative.   Physical Exam Updated Vital Signs BP (!) 139/117   Pulse 92   Temp 98 F (36.7 C)   Resp (!) 30   Ht 1.727 m (5\' 8" )   Wt 65 kg   SpO2 90%   BMI 21.79 kg/m  Physical Exam Vitals and nursing note reviewed.  Constitutional:      General: He is not in acute distress.    Appearance: Normal appearance. He is well-developed.  HENT:     Head: Normocephalic and atraumatic.  Eyes:     Conjunctiva/sclera: Conjunctivae  normal.  Cardiovascular:     Rate and Rhythm: Tachycardia present. Rhythm irregular.     Heart sounds: No murmur heard. Pulmonary:     Effort: Pulmonary effort is normal. No respiratory distress.     Breath sounds: Normal breath sounds. No wheezing, rhonchi or rales.  Abdominal:     Palpations: Abdomen is soft.     Tenderness: There is no abdominal tenderness.  Musculoskeletal:        General: No swelling.     Cervical back: Neck supple.     Right lower leg: Edema present.     Left lower leg: Edema present.  Skin:    General: Skin is warm and dry.     Capillary Refill: Capillary refill takes less than 2 seconds.  Neurological:     Mental Status: He is alert.  Psychiatric:        Mood and Affect: Mood normal.     ED Results / Procedures / Treatments   Labs (all labs ordered are listed, but only abnormal results are displayed) Labs Reviewed  ETHANOL - Abnormal; Notable for the following components:      Result Value   Alcohol , Ethyl (B) 57 (*)    All other components within normal limits    EKG EKG Interpretation Date/Time:  Tuesday Dec 07 2023 18:08:01 EDT Ventricular Rate:  113 PR Interval:    QRS Duration:  101 QT Interval:  347 QTC Calculation: 476 R Axis:   93  Text Interpretation: Atrial fibrillation Ventricular premature complex Right axis deviation Borderline prolonged QT interval Confirmed by Evangelina Delancey [phone]) on 12/07/2023 7:35:37 PM  Radiology DG Chest Port 1 View Result Date: 12/07/2023 CLINICAL DATA:  Shortness of breath EXAM: PORTABLE CHEST 1 VIEW COMPARISON:  12/06/2023 FINDINGS: Borderline to mild cardiomegaly. Mild diffuse interstitial opacity compared to prior suggestive of mild interstitial edema. No sizable effusion. No consolidation or pneumothorax. Chronic appearing fracture deformity of the right clavicle and right ribs. IMPRESSION: Borderline to mild cardiomegaly with mild diffuse interstitial opacity suggestive of mild interstitial edema.  Electronically Signed   By: Esmeralda Hedge M.D.   On: 12/07/2023 20:58   DG Chest Portable 1 View Result Date: 12/06/2023 CLINICAL DATA:  Shortness of breath EXAM: PORTABLE CHEST 1 VIEW COMPARISON:  08/01/2023 FINDINGS: Heart and mediastinal contours are within normal limits. No focal opacities or effusions. No acute bony abnormality. IMPRESSION: No active disease. Electronically Signed   By: Janeece Mechanic M.D.   On: 12/06/2023 01:01    Procedures Procedures    Medications Ordered in ED Medications  LORazepam  (ATIVAN ) injection 1 mg (1 mg Intravenous Given 12/07/23 1953)    ED Course/ Medical Decision Making/ A&P  Medical Decision Making Amount and/or Complexity of Data Reviewed Labs: ordered. Radiology: ordered.  Risk Prescription drug management.   Patient with extensive lab workup yesterday.  Will check alcohol  level here and will get chest x-ray.  Will verify to make sure that that has not been done already.  And will give some Ativan .  Patient's alcohol  level is 57 which means he is probably going through withdrawal with his tachycardia.  Chest x-ray Bardy borderline cardiomegaly with mild diffuse interstitial opacities suggestive of mild interstitial edema.  But patient oxygen levels have been good.  Patient had labs just yesterday we will broaden them out will talk to the hospitalist for admission will put him on the CIWA protocol for alcohol  withdrawal.  CRITICAL CARE Performed by: Taryll Reichenberger Total critical care time: 45 minutes Critical care time was exclusive of separately billable procedures and treating other patients. Critical care was necessary to treat or prevent imminent or life-threatening deterioration. Critical care was time spent personally by me on the following activities: development of treatment plan with patient and/or surrogate as well as nursing, discussions with consultants, evaluation of patient's response to  treatment, examination of patient, obtaining history from patient or surrogate, ordering and performing treatments and interventions, ordering and review of laboratory studies, ordering and review of radiographic studies, pulse oximetry and re-evaluation of patient's condition.    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

## 2023-12-08 ENCOUNTER — Observation Stay (HOSPITAL_COMMUNITY)

## 2023-12-08 ENCOUNTER — Encounter (HOSPITAL_COMMUNITY): Payer: Self-pay | Admitting: Internal Medicine

## 2023-12-08 DIAGNOSIS — I5020 Unspecified systolic (congestive) heart failure: Secondary | ICD-10-CM | POA: Diagnosis not present

## 2023-12-08 DIAGNOSIS — R932 Abnormal findings on diagnostic imaging of liver and biliary tract: Secondary | ICD-10-CM | POA: Diagnosis not present

## 2023-12-08 DIAGNOSIS — I110 Hypertensive heart disease with heart failure: Secondary | ICD-10-CM | POA: Diagnosis present

## 2023-12-08 DIAGNOSIS — Z7189 Other specified counseling: Secondary | ICD-10-CM | POA: Diagnosis not present

## 2023-12-08 DIAGNOSIS — A419 Sepsis, unspecified organism: Secondary | ICD-10-CM | POA: Diagnosis not present

## 2023-12-08 DIAGNOSIS — Z66 Do not resuscitate: Secondary | ICD-10-CM | POA: Diagnosis present

## 2023-12-08 DIAGNOSIS — Z86711 Personal history of pulmonary embolism: Secondary | ICD-10-CM | POA: Diagnosis not present

## 2023-12-08 DIAGNOSIS — F10930 Alcohol use, unspecified with withdrawal, uncomplicated: Secondary | ICD-10-CM | POA: Diagnosis not present

## 2023-12-08 DIAGNOSIS — Z5900 Homelessness unspecified: Secondary | ICD-10-CM | POA: Diagnosis not present

## 2023-12-08 DIAGNOSIS — Z5902 Unsheltered homelessness: Secondary | ICD-10-CM | POA: Diagnosis not present

## 2023-12-08 DIAGNOSIS — F10131 Alcohol abuse with withdrawal delirium: Secondary | ICD-10-CM | POA: Diagnosis present

## 2023-12-08 DIAGNOSIS — E440 Moderate protein-calorie malnutrition: Secondary | ICD-10-CM | POA: Diagnosis present

## 2023-12-08 DIAGNOSIS — K683 Retroperitoneal hematoma: Secondary | ICD-10-CM | POA: Diagnosis not present

## 2023-12-08 DIAGNOSIS — R410 Disorientation, unspecified: Secondary | ICD-10-CM | POA: Diagnosis present

## 2023-12-08 DIAGNOSIS — I10 Essential (primary) hypertension: Secondary | ICD-10-CM | POA: Diagnosis not present

## 2023-12-08 DIAGNOSIS — R609 Edema, unspecified: Secondary | ICD-10-CM | POA: Diagnosis not present

## 2023-12-08 DIAGNOSIS — R531 Weakness: Secondary | ICD-10-CM | POA: Diagnosis not present

## 2023-12-08 DIAGNOSIS — D500 Iron deficiency anemia secondary to blood loss (chronic): Secondary | ICD-10-CM | POA: Diagnosis present

## 2023-12-08 DIAGNOSIS — I700 Atherosclerosis of aorta: Secondary | ICD-10-CM | POA: Diagnosis present

## 2023-12-08 DIAGNOSIS — E162 Hypoglycemia, unspecified: Secondary | ICD-10-CM | POA: Diagnosis not present

## 2023-12-08 DIAGNOSIS — D539 Nutritional anemia, unspecified: Secondary | ICD-10-CM | POA: Diagnosis present

## 2023-12-08 DIAGNOSIS — F10931 Alcohol use, unspecified with withdrawal delirium: Secondary | ICD-10-CM | POA: Diagnosis not present

## 2023-12-08 DIAGNOSIS — F1010 Alcohol abuse, uncomplicated: Secondary | ICD-10-CM | POA: Diagnosis not present

## 2023-12-08 DIAGNOSIS — F329 Major depressive disorder, single episode, unspecified: Secondary | ICD-10-CM | POA: Diagnosis present

## 2023-12-08 DIAGNOSIS — I4819 Other persistent atrial fibrillation: Secondary | ICD-10-CM | POA: Diagnosis present

## 2023-12-08 DIAGNOSIS — R569 Unspecified convulsions: Secondary | ICD-10-CM | POA: Diagnosis not present

## 2023-12-08 DIAGNOSIS — F05 Delirium due to known physiological condition: Secondary | ICD-10-CM | POA: Diagnosis not present

## 2023-12-08 DIAGNOSIS — Z515 Encounter for palliative care: Secondary | ICD-10-CM | POA: Diagnosis not present

## 2023-12-08 DIAGNOSIS — I509 Heart failure, unspecified: Secondary | ICD-10-CM | POA: Diagnosis not present

## 2023-12-08 DIAGNOSIS — J9601 Acute respiratory failure with hypoxia: Secondary | ICD-10-CM | POA: Diagnosis not present

## 2023-12-08 DIAGNOSIS — R4182 Altered mental status, unspecified: Secondary | ICD-10-CM | POA: Diagnosis not present

## 2023-12-08 DIAGNOSIS — F10939 Alcohol use, unspecified with withdrawal, unspecified: Secondary | ICD-10-CM | POA: Diagnosis present

## 2023-12-08 DIAGNOSIS — F10121 Alcohol abuse with intoxication delirium: Secondary | ICD-10-CM | POA: Diagnosis present

## 2023-12-08 DIAGNOSIS — I4891 Unspecified atrial fibrillation: Secondary | ICD-10-CM | POA: Diagnosis not present

## 2023-12-08 DIAGNOSIS — R338 Other retention of urine: Secondary | ICD-10-CM | POA: Diagnosis not present

## 2023-12-08 DIAGNOSIS — J441 Chronic obstructive pulmonary disease with (acute) exacerbation: Secondary | ICD-10-CM | POA: Diagnosis not present

## 2023-12-08 DIAGNOSIS — I5022 Chronic systolic (congestive) heart failure: Secondary | ICD-10-CM | POA: Diagnosis present

## 2023-12-08 DIAGNOSIS — R6521 Severe sepsis with septic shock: Secondary | ICD-10-CM | POA: Diagnosis not present

## 2023-12-08 DIAGNOSIS — R638 Other symptoms and signs concerning food and fluid intake: Secondary | ICD-10-CM | POA: Diagnosis not present

## 2023-12-08 DIAGNOSIS — R0602 Shortness of breath: Secondary | ICD-10-CM | POA: Diagnosis not present

## 2023-12-08 DIAGNOSIS — I480 Paroxysmal atrial fibrillation: Secondary | ICD-10-CM | POA: Diagnosis not present

## 2023-12-08 DIAGNOSIS — E872 Acidosis, unspecified: Secondary | ICD-10-CM | POA: Diagnosis not present

## 2023-12-08 DIAGNOSIS — K7040 Alcoholic hepatic failure without coma: Secondary | ICD-10-CM | POA: Diagnosis present

## 2023-12-08 DIAGNOSIS — L89893 Pressure ulcer of other site, stage 3: Secondary | ICD-10-CM | POA: Diagnosis present

## 2023-12-08 DIAGNOSIS — G9341 Metabolic encephalopathy: Secondary | ICD-10-CM | POA: Diagnosis not present

## 2023-12-08 DIAGNOSIS — F03911 Unspecified dementia, unspecified severity, with agitation: Secondary | ICD-10-CM | POA: Diagnosis not present

## 2023-12-08 DIAGNOSIS — E871 Hypo-osmolality and hyponatremia: Secondary | ICD-10-CM | POA: Diagnosis not present

## 2023-12-08 DIAGNOSIS — Y902 Blood alcohol level of 40-59 mg/100 ml: Secondary | ICD-10-CM | POA: Diagnosis present

## 2023-12-08 DIAGNOSIS — R7401 Elevation of levels of liver transaminase levels: Secondary | ICD-10-CM | POA: Diagnosis not present

## 2023-12-08 LAB — COMPREHENSIVE METABOLIC PANEL WITH GFR
ALT: 28 U/L (ref 0–44)
ALT: 29 U/L (ref 0–44)
AST: 74 U/L — ABNORMAL HIGH (ref 15–41)
AST: 79 U/L — ABNORMAL HIGH (ref 15–41)
Albumin: 3 g/dL — ABNORMAL LOW (ref 3.5–5.0)
Albumin: 3 g/dL — ABNORMAL LOW (ref 3.5–5.0)
Alkaline Phosphatase: 112 U/L (ref 38–126)
Alkaline Phosphatase: 116 U/L (ref 38–126)
Anion gap: 15 (ref 5–15)
Anion gap: 8 (ref 5–15)
BUN: 11 mg/dL (ref 8–23)
BUN: 12 mg/dL (ref 8–23)
CO2: 14 mmol/L — ABNORMAL LOW (ref 22–32)
CO2: 19 mmol/L — ABNORMAL LOW (ref 22–32)
Calcium: 7.7 mg/dL — ABNORMAL LOW (ref 8.9–10.3)
Calcium: 8 mg/dL — ABNORMAL LOW (ref 8.9–10.3)
Chloride: 110 mmol/L (ref 98–111)
Chloride: 111 mmol/L (ref 98–111)
Creatinine, Ser: 0.71 mg/dL (ref 0.61–1.24)
Creatinine, Ser: 0.72 mg/dL (ref 0.61–1.24)
GFR, Estimated: >60 mL/min (ref >60)
GFR, Estimated: >60 mL/min (ref >60)
Glucose, Bld: 61 mg/dL — ABNORMAL LOW (ref 70–99)
Glucose, Bld: 95 mg/dL (ref 70–99)
Potassium: 3.7 mmol/L (ref 3.5–5.1)
Potassium: 4.4 mmol/L (ref 3.5–5.1)
Sodium: 138 mmol/L (ref 135–145)
Sodium: 139 mmol/L (ref 135–145)
Total Bilirubin: 0.8 mg/dL (ref 0.0–1.2)
Total Bilirubin: 1.3 mg/dL — ABNORMAL HIGH (ref 0.0–1.2)
Total Protein: 6.3 g/dL — ABNORMAL LOW (ref 6.5–8.1)
Total Protein: 6.5 g/dL (ref 6.5–8.1)

## 2023-12-08 LAB — CBC WITH DIFFERENTIAL/PLATELET
Abs Immature Granulocytes: 0.01 10*3/uL (ref 0.00–0.07)
Abs Immature Granulocytes: 0.01 10*3/uL (ref 0.00–0.07)
Basophils Absolute: 0.1 10*3/uL (ref 0.0–0.1)
Basophils Absolute: 0.1 10*3/uL (ref 0.0–0.1)
Basophils Relative: 2 %
Basophils Relative: 2 %
Eosinophils Absolute: 0.1 10*3/uL (ref 0.0–0.5)
Eosinophils Absolute: 0.1 10*3/uL (ref 0.0–0.5)
Eosinophils Relative: 1 %
Eosinophils Relative: 3 %
HCT: 24.3 % — ABNORMAL LOW (ref 39.0–52.0)
HCT: 26.1 % — ABNORMAL LOW (ref 39.0–52.0)
Hemoglobin: 7.5 g/dL — ABNORMAL LOW (ref 13.0–17.0)
Hemoglobin: 8.1 g/dL — ABNORMAL LOW (ref 13.0–17.0)
Immature Granulocytes: 0 %
Immature Granulocytes: 0 %
Lymphocytes Relative: 41 %
Lymphocytes Relative: 44 %
Lymphs Abs: 1.3 10*3/uL (ref 0.7–4.0)
Lymphs Abs: 1.5 10*3/uL (ref 0.7–4.0)
MCH: 29.3 pg (ref 26.0–34.0)
MCH: 29.8 pg (ref 26.0–34.0)
MCHC: 30.9 g/dL (ref 30.0–36.0)
MCHC: 31 g/dL (ref 30.0–36.0)
MCV: 94.6 fL (ref 80.0–100.0)
MCV: 96.4 fL (ref 80.0–100.0)
Monocytes Absolute: 0.4 10*3/uL (ref 0.1–1.0)
Monocytes Absolute: 0.6 10*3/uL (ref 0.1–1.0)
Monocytes Relative: 13 %
Monocytes Relative: 18 %
Neutro Abs: 1.2 10*3/uL — ABNORMAL LOW (ref 1.7–7.7)
Neutro Abs: 1.3 10*3/uL — ABNORMAL LOW (ref 1.7–7.7)
Neutrophils Relative %: 35 %
Neutrophils Relative %: 41 %
Platelets: 220 10*3/uL (ref 150–400)
Platelets: 235 10*3/uL (ref 150–400)
RBC: 2.52 MIL/uL — ABNORMAL LOW (ref 4.22–5.81)
RBC: 2.76 MIL/uL — ABNORMAL LOW (ref 4.22–5.81)
RDW: 19.6 % — ABNORMAL HIGH (ref 11.5–15.5)
RDW: 19.8 % — ABNORMAL HIGH (ref 11.5–15.5)
WBC: 3.2 10*3/uL — ABNORMAL LOW (ref 4.0–10.5)
WBC: 3.5 10*3/uL — ABNORMAL LOW (ref 4.0–10.5)
nRBC: 0 % (ref 0.0–0.2)
nRBC: 0 % (ref 0.0–0.2)

## 2023-12-08 LAB — LIPASE, BLOOD: Lipase: 30 U/L (ref 11–51)

## 2023-12-08 LAB — CBG MONITORING, ED
Glucose-Capillary: 64 mg/dL — ABNORMAL LOW (ref 70–99)
Glucose-Capillary: 64 mg/dL — ABNORMAL LOW (ref 70–99)
Glucose-Capillary: 163 mg/dL — ABNORMAL HIGH (ref 70–99)
Glucose-Capillary: 146 mg/dL — ABNORMAL HIGH (ref 70–99)
Glucose-Capillary: 134 mg/dL — ABNORMAL HIGH (ref 70–99)
Glucose-Capillary: 122 mg/dL — ABNORMAL HIGH (ref 70–99)
Glucose-Capillary: 121 mg/dL — ABNORMAL HIGH (ref 70–99)
Glucose-Capillary: 84 mg/dL (ref 70–99)
Glucose-Capillary: 180 mg/dL — ABNORMAL HIGH (ref 70–99)

## 2023-12-08 LAB — BASIC METABOLIC PANEL WITH GFR
Anion gap: 18 — ABNORMAL HIGH (ref 5–15)
BUN: 12 mg/dL (ref 8–23)
CO2: 12 mmol/L — ABNORMAL LOW (ref 22–32)
Calcium: 8 mg/dL — ABNORMAL LOW (ref 8.9–10.3)
Chloride: 106 mmol/L (ref 98–111)
Creatinine, Ser: 0.93 mg/dL (ref 0.61–1.24)
GFR, Estimated: >60 mL/min (ref >60)
Glucose, Bld: 179 mg/dL — ABNORMAL HIGH (ref 70–99)
Potassium: 4.4 mmol/L (ref 3.5–5.1)
Sodium: 136 mmol/L (ref 135–145)

## 2023-12-08 LAB — RETICULOCYTES
Immature Retic Fract: 16.6 % — ABNORMAL HIGH (ref 2.3–15.9)
RBC.: 2.42 MIL/uL — ABNORMAL LOW (ref 4.22–5.81)
Retic Count, Absolute: 26.4 10*3/uL (ref 19.0–186.0)
Retic Ct Pct: 1.1 % (ref 0.4–3.1)

## 2023-12-08 LAB — IRON AND TIBC
Iron: 42 ug/dL — ABNORMAL LOW (ref 45–182)
Saturation Ratios: 14 % — ABNORMAL LOW (ref 17.9–39.5)
TIBC: 308 ug/dL (ref 250–450)
UIBC: 266 ug/dL

## 2023-12-08 LAB — FOLATE: Folate: 12.1 ng/mL (ref >5.9)

## 2023-12-08 LAB — I-STAT ARTERIAL BLOOD GAS, ED
Acid-base deficit: 14 mmol/L — ABNORMAL HIGH (ref 0.0–2.0)
Bicarbonate: 9.8 mmol/L — ABNORMAL LOW (ref 20.0–28.0)
Calcium, Ion: 1.09 mmol/L — ABNORMAL LOW (ref 1.15–1.40)
HCT: 31 % — ABNORMAL LOW (ref 39.0–52.0)
Hemoglobin: 10.5 g/dL — ABNORMAL LOW (ref 13.0–17.0)
O2 Saturation: 100 %
Potassium: 4.5 mmol/L (ref 3.5–5.1)
Sodium: 136 mmol/L (ref 135–145)
TCO2: 10 mmol/L — ABNORMAL LOW (ref 22–32)
pCO2 arterial: 19.5 mmHg — CL (ref 32–48)
pH, Arterial: 7.311 — ABNORMAL LOW (ref 7.35–7.45)
pO2, Arterial: 375 mmHg — ABNORMAL HIGH (ref 83–108)

## 2023-12-08 LAB — ACETAMINOPHEN LEVEL: Acetaminophen (Tylenol), Serum: <10 ug/mL — ABNORMAL LOW (ref 10–30)

## 2023-12-08 LAB — VITAMIN B12: Vitamin B-12: 440 pg/mL (ref 180–914)

## 2023-12-08 LAB — AMMONIA: Ammonia: 39 umol/L — ABNORMAL HIGH (ref 9–35)

## 2023-12-08 LAB — FERRITIN: Ferritin: 9 ng/mL — ABNORMAL LOW (ref 24–336)

## 2023-12-08 LAB — SALICYLATE LEVEL: Salicylate Lvl: <7 mg/dL — ABNORMAL LOW (ref 7.0–30.0)

## 2023-12-08 LAB — TSH: TSH: 2.251 u[IU]/mL (ref 0.350–4.500)

## 2023-12-08 LAB — LACTIC ACID, PLASMA: Lactic Acid, Venous: 7.9 mmol/L (ref 0.5–1.9)

## 2023-12-08 LAB — BETA-HYDROXYBUTYRIC ACID: Beta-Hydroxybutyric Acid: 0.5 mmol/L — ABNORMAL HIGH (ref 0.05–0.27)

## 2023-12-08 MED ORDER — PHENOBARBITAL SODIUM 130 MG/ML IJ SOLN
97.5000 mg | Freq: Three times a day (TID) | INTRAMUSCULAR | Status: AC
  Administered 2023-12-08 – 2023-12-09 (×4): 97.5 mg via INTRAVENOUS
  Filled 2023-12-08 (×5): qty 1

## 2023-12-08 MED ORDER — METOPROLOL TARTRATE 25 MG PO TABS: 25.0000 mg | ORAL_TABLET | Freq: Two times a day (BID) | ORAL | Status: DC

## 2023-12-08 MED ORDER — THIAMINE HCL 100 MG/ML IJ SOLN: 100.0000 mg | Freq: Every day | INTRAMUSCULAR | Status: DC

## 2023-12-08 MED ORDER — FERROUS SULFATE 325 (65 FE) MG PO TABS
325.0000 mg | ORAL_TABLET | Freq: Every day | ORAL | Status: DC
  Administered 2023-12-09 – 2023-12-11 (×2): 325 mg via ORAL
  Filled 2023-12-08 (×3): qty 1

## 2023-12-08 MED ORDER — LORAZEPAM 1 MG PO TABS: 1.0000 mg | ORAL_TABLET | ORAL | Status: DC | PRN

## 2023-12-08 MED ORDER — THIAMINE HCL 100 MG/ML IJ SOLN
500.0000 mg | Freq: Three times a day (TID) | INTRAVENOUS | Status: AC
  Administered 2023-12-08 – 2023-12-09 (×3): 500 mg via INTRAVENOUS
  Filled 2023-12-08 (×5): qty 5

## 2023-12-08 MED ORDER — LORAZEPAM 2 MG/ML IJ SOLN
1.0000 mg | INTRAMUSCULAR | Status: DC | PRN
  Administered 2023-12-08: 2 mg via INTRAVENOUS
  Administered 2023-12-08: 1 mg via INTRAVENOUS
  Administered 2023-12-08: 2 mg via INTRAVENOUS
  Filled 2023-12-08 (×3): qty 1

## 2023-12-08 MED ORDER — PANTOPRAZOLE SODIUM 40 MG PO TBEC
40.0000 mg | DELAYED_RELEASE_TABLET | Freq: Every day | ORAL | Status: DC
  Administered 2023-12-08 – 2023-12-15 (×7): 40 mg via ORAL
  Filled 2023-12-08 (×8): qty 1

## 2023-12-08 MED ORDER — LORAZEPAM 2 MG/ML IJ SOLN
1.0000 mg | INTRAMUSCULAR | Status: DC | PRN
  Administered 2023-12-09 – 2023-12-12 (×4): 1 mg via INTRAVENOUS
  Filled 2023-12-08 (×4): qty 1

## 2023-12-08 MED ORDER — PHENOBARBITAL SODIUM 65 MG/ML IJ SOLN
65.0000 mg | Freq: Three times a day (TID) | INTRAMUSCULAR | Status: DC
  Administered 2023-12-10: 65 mg via INTRAVENOUS
  Filled 2023-12-08: qty 1

## 2023-12-08 MED ORDER — LACTATED RINGERS IV BOLUS: 1000.0000 mL | Freq: Once | INTRAVENOUS | Status: DC

## 2023-12-08 MED ORDER — ADULT MULTIVITAMIN W/MINERALS CH
1.0000 | ORAL_TABLET | Freq: Every day | ORAL | Status: DC
  Administered 2023-12-08 – 2023-12-09 (×2): 1 via ORAL
  Filled 2023-12-08 (×4): qty 1

## 2023-12-08 MED ORDER — THIAMINE MONONITRATE 100 MG PO TABS
100.0000 mg | ORAL_TABLET | Freq: Every day | ORAL | Status: DC
  Administered 2023-12-08: 100 mg via ORAL
  Filled 2023-12-08: qty 1

## 2023-12-08 MED ORDER — PHENOBARBITAL SODIUM 65 MG/ML IJ SOLN
32.5000 mg | Freq: Three times a day (TID) | INTRAMUSCULAR | Status: DC
Start: 2023-12-12 — End: 2023-12-11

## 2023-12-08 MED ORDER — LACTATED RINGERS IV BOLUS
250.0000 mL | Freq: Once | INTRAVENOUS | Status: AC
Start: 2023-12-09 — End: 2023-12-09
  Administered 2023-12-09: 250 mL via INTRAVENOUS

## 2023-12-08 MED ORDER — FOLIC ACID 1 MG PO TABS
1.0000 mg | ORAL_TABLET | Freq: Every day | ORAL | Status: DC
  Administered 2023-12-08 – 2023-12-13 (×5): 1 mg via ORAL
  Filled 2023-12-08 (×6): qty 1

## 2023-12-08 MED ORDER — THIAMINE HCL 100 MG/ML IJ SOLN
500.0000 mg | Freq: Once | INTRAVENOUS | Status: AC
  Administered 2023-12-08: 500 mg via INTRAVENOUS
  Filled 2023-12-08: qty 5

## 2023-12-08 MED ORDER — METOPROLOL TARTRATE 5 MG/5ML IV SOLN: 2.5000 mg | Freq: Three times a day (TID) | INTRAVENOUS | Status: DC | PRN

## 2023-12-08 MED ORDER — DEXTROSE-SODIUM CHLORIDE 5-0.9 % IV SOLN: INTRAVENOUS | Status: DC

## 2023-12-08 MED ORDER — DEXTROSE 50 % IV SOLN
1.0000 | Freq: Once | INTRAVENOUS | Status: AC
  Administered 2023-12-08: 50 mL via INTRAVENOUS
  Filled 2023-12-08: qty 50

## 2023-12-08 MED ORDER — METOPROLOL TARTRATE 25 MG PO TABS
25.0000 mg | ORAL_TABLET | Freq: Two times a day (BID) | ORAL | Status: DC
  Administered 2023-12-08 (×2): 25 mg via ORAL
  Filled 2023-12-08 (×2): qty 1

## 2023-12-08 MED ORDER — DEXTROSE 10 % IV SOLN
INTRAVENOUS | Status: DC
  Filled 2023-12-08: qty 1000

## 2023-12-08 MED ORDER — ENOXAPARIN SODIUM 80 MG/0.8ML IJ SOSY
65.0000 mg | PREFILLED_SYRINGE | Freq: Two times a day (BID) | INTRAMUSCULAR | Status: DC
  Administered 2023-12-08 – 2023-12-09 (×3): 65 mg via SUBCUTANEOUS
  Filled 2023-12-08 (×3): qty 0.65

## 2023-12-08 MED ORDER — IOHEXOL 350 MG/ML SOLN
75.0000 mL | Freq: Once | INTRAVENOUS | Status: AC | PRN
  Administered 2023-12-08: 75 mL via INTRAVENOUS

## 2023-12-08 NOTE — Progress Notes (Signed)
 PROGRESS NOTE  Anthony A Amborn Jr. ZOX:[id number] DOB: 1956/04/18 DOA: 12/07/2023 PCP: Pcp, No  HPI/Recap of past 24 hours: Anthony [name] Anthony [name]. is a 68 y.o. male with history of alcohol  abuse, PE who has had multiple prior hospital admission for delirium and discharge summary in February 2025 states that patient likely could have dementia had come to the ER with complaints of shortness of breath.  Patient as per the report  is homeless and not sure if he has been taking his medicines. In the ED, patient became more agitated, confused, not following commands.  Labs showed glucose of 61 was started on D5. CT head is unremarkable.  Patient admitted for acute delirium with hypoglycemia, possible alcohol  withdrawal.  Ammonia level is 39 alcohol  level is 61.    Today, patient continues to remain agitated intermittently, confused, currently on restraints.  Requiring multiple doses of IV Ativan .  Concern for not able to protect his airway if needing further Ativan .  Unable to get good waveform for his saturation.  ABG currently reassuring.  Discussed with PCCM for possibility of possible Precedex  and close monitoring, PCCM will see.    Assessment/Plan: Principal Problem:   Hypoglycemia Active Problems:   Paroxysmal atrial fibrillation (HCC)   HTN (hypertension)   Macrocytic anemia   History of pulmonary embolus (PE)   Alcohol  withdrawal (HCC)   Alcohol  withdrawal delirium (HCC)   Acute delirium    Hypoglycemia Likely from poor oral intake, in addition to alcohol  abuse Started on D10, IV thiamine  was given before starting C-peptide, beta hydroxybutyric acid, proinsulin level pending CT abdomen unremarkable, but does show gallbladder wall thickening with sludge Right upper quadrant ultrasound showed gallbladder sludge with significant gallbladder wall thickening and edema but no stones identified.  If further concern for acute gallbladder pathology, recommend HIDA scan Possible HIDA scan once  stable Continue D10 IV Monitor closely  Acute delirium/encephalopathy Alcohol  abuse/withdrawal Concern for possible dementia likely related to alcohol  use ABG with pH 7.3, CO2 19.5, PO2 375 (while on supplemental O2), bicarb 9.8 TSH, ammonia levels unremarkable B12 levels 440 EEG pending Due to requiring frequent Ativan  with persistent agitation/withdrawal, PCCM consulted for possible Precedex  and closer monitoring CIWA protocol Soft restraints  Iron  deficiency anemia Anemia panel shows iron  42, sats 14, ferritin 9 Start oral iron  supplement for now Daily CBC  Paroxysmal A-fib Appears to be in A-fib with RVR Likely 2/2 alcohol  withdrawal Continue Lopressor  scheduled as well as as needed IV for now Continue Lovenox  therapeutic, hold home apixaban  Telemetry  History of PE Unsure if patient has been taking his Eliquis  Continue therapeutic dose of Lovenox  for now Monitor closely  History of chronic HFrEF Noncompliant Last EF was 45 to 50% in December 2024 Monitor closely   Estimated body mass index is 21.79 kg/m as calculated from the following:   Height as of this encounter: 5\' 8"  (1.727 m).   Weight as of this encounter: 65 kg.     Code Status: Full  Family Communication: None at bedside  Disposition Plan: Status is: Inpatient Remains inpatient appropriate because: Level of care      Consultants: PCCM  Procedures: None  Antimicrobials: None  DVT prophylaxis: Lovenox  therapeutic   Objective: Vitals:   12/08/23 1615 12/08/23 1700 12/08/23 1715 12/08/23 1745  BP: (!) 154/101 (!) 149/115 (!) 145/117 (!) 148/120  Pulse:    60  Resp: (!) 57 (!) 28 (!) 39 20  Temp:      TempSrc:  SpO2:    (!) 70%  Weight:      Height:        Intake/Output Summary (Last 24 hours) at 12/08/2023 1822 Last data filed at 12/08/2023 1420 Gross per 24 hour  Intake 53.18 ml  Output --  Net 53.18 ml   Filed Weights   12/07/23 1804  Weight: 65 kg     Exam: General: Agitated, confused Cardiovascular: S1, S2 present Respiratory: CTAB Abdomen: Soft, nontender, nondistended, bowel sounds present Musculoskeletal: No bilateral pedal edema noted Skin: Normal Psychiatry: Unable to assess    Data Reviewed: CBC: Recent Labs  Lab 12/06/23 0034 12/06/23 2116 12/07/23 2340 12/08/23 0316 12/08/23 1718  WBC 4.8 4.4 3.2* 3.5*  --   NEUTROABS 2.2 2.2 1.3* 1.2*  --   HGB 8.4* 8.4* 7.5* 8.1* 10.5*  HCT 27.4* 27.2* 24.3* 26.1* 31.0*  MCV 96.1 96.8 96.4 94.6  --   PLT 255 262 220 235  --    Basic Metabolic Panel: Recent Labs  Lab 12/06/23 0034 12/06/23 0035 12/06/23 2116 12/07/23 2340 12/08/23 0316 12/08/23 1718  NA 132*  --  138 139 138 136  K 3.0*  --  3.5 3.7 4.4 4.5  CL 102  --  110 110 111  --   CO2 16*  --  16* 14* 19*  --   GLUCOSE 86  --  88 61* 95  --   BUN 14  --  10 12 11   --   CREATININE 0.65  --  0.67 0.72 0.71  --   CALCIUM  8.3*  --  8.3* 8.0* 7.7*  --   MG  --  1.4*  --   --   --   --    GFR: Estimated Creatinine Clearance: 81.3 mL/min (by C-G formula based on SCr of 0.71 mg/dL). Liver Function Tests: Recent Labs  Lab 12/06/23 0034 12/06/23 2116 12/07/23 2340 12/08/23 0316  AST 68* 73* 74* 79*  ALT 34 31 28 29   ALKPHOS 136* 104 112 116  BILITOT 0.8 0.5 0.8 1.3*  PROT 7.4 7.1 6.3* 6.5  ALBUMIN  3.6 3.3* 3.0* 3.0*   Recent Labs  Lab 12/06/23 2116 12/07/23 2340  LIPASE 49 30   Recent Labs  Lab 12/08/23 0430  AMMONIA 39*   Coagulation Profile: Recent Labs  Lab 12/06/23 0034  INR 1.2   Cardiac Enzymes: No results for input(s): "CKTOTAL", "CKMB", "CKMBINDEX", "TROPONINI" in the last 168 hours. BNP (last 3 results) No results for input(s): "PROBNP" in the last 8760 hours. HbA1C: No results for input(s): "HGBA1C" in the last 72 hours. CBG: Recent Labs  Lab 12/08/23 0634 12/08/23 0753 12/08/23 1054 12/08/23 1231 12/08/23 1557  GLUCAP 146* 134* 122* 121* 84   Lipid Profile: No  results for input(s): "CHOL", "HDL", "LDLCALC", "TRIG", "CHOLHDL", "LDLDIRECT" in the last 72 hours. Thyroid Function Tests: Recent Labs    12/08/23 0316  TSH 2.251   Anemia Panel: Recent Labs    12/08/23 0633 12/08/23 0634 12/08/23 0635  VITAMINB12 440  --   --   FOLATE  --  12.1  --   FERRITIN  --  9*  --   TIBC  --  308  --   IRON   --  42*  --   RETICCTPCT  --   --  1.1   Urine analysis:    Component Value Date/Time   COLORURINE YELLOW 07/30/2023 2031   APPEARANCEUR CLOUDY (A) 07/30/2023 2031   LABSPEC 1.014 07/30/2023 2031   PHURINE  8.0 07/30/2023 2031   GLUCOSEU NEGATIVE 07/30/2023 2031   HGBUR NEGATIVE 07/30/2023 2031   BILIRUBINUR NEGATIVE 07/30/2023 2031   KETONESUR NEGATIVE 07/30/2023 2031   PROTEINUR NEGATIVE 07/30/2023 2031   UROBILINOGEN 1.0 10/30/2010 1345   NITRITE NEGATIVE 07/30/2023 2031   LEUKOCYTESUR NEGATIVE 07/30/2023 2031   Sepsis Labs: @LABRCNTIP (procalcitonin:4,lacticidven:4)  )No results found for this or any previous visit (from the past 240 hours).    Studies: US  Abdomen Limited RUQ (LIVER/GB) Result Date: 12/08/2023 CLINICAL DATA:  Elevated liver function tests EXAM: ULTRASOUND ABDOMEN LIMITED RIGHT UPPER QUADRANT COMPARISON:  CT from earlier 12/08/2023. FINDINGS: Gallbladder: Gallbladder is distended with significant wall thickening and wall edema. No obvious stones. There is some sludge. Common bile duct: Diameter: 3 mm Liver: Diffusely echogenic hepatic parenchyma. Portal vein is patent on color Doppler imaging with normal direction of blood flow towards the liver. Other: Right-sided pleural effusion. IMPRESSION: Gallbladder sludge with significant gallbladder wall thickening and edema but no stones identified. Further workup if there is further concern of acute gallbladder pathology such as a HIDA scan. No ductal dilatation. Fatty liver infiltration. Right-sided pleural effusion Electronically Signed   By: Adrianna Horde M.D.   On: 12/08/2023  11:26   CT ABDOMEN PELVIS W CONTRAST Result Date: 12/08/2023 CLINICAL DATA:  Abdominal pain, acute, nonlocalized. EXAM: CT ABDOMEN AND PELVIS WITH CONTRAST TECHNIQUE: Multidetector CT imaging of the abdomen and pelvis was performed using the standard protocol following bolus administration of intravenous contrast. RADIATION DOSE REDUCTION: This exam was performed according to the departmental dose-optimization program which includes automated exposure control, adjustment of the mA and/or kV according to patient size and/or use of iterative reconstruction technique. CONTRAST:  75mL OMNIPAQUE  IOHEXOL  350 MG/ML SOLN COMPARISON:  CT scan abdomen and pelvis from 02/03/2023. FINDINGS: Examination is moderately degraded by patient's motion during data acquisition. Lower chest: There is trace left and small right pleural effusion with associated compressive atelectatic changes. There also additional atelectatic changes in the right lung lower lobe. Mild cardiomegaly. No pericardial effusion. There are coronary artery atherosclerotic calcifications, in keeping with coronary artery disease. There are postsurgical changes in the GE junction region. Hepatobiliary: The liver is normal in size. Non-cirrhotic configuration. No suspicious mass. No intrahepatic or extrahepatic bile duct dilation. Evaluation of gallbladder is markedly limited due to extensive motion at this level. There is small amount of layering gallstones/sludge. There is mild diffuse gallbladder wall thickening, which is favored most likely secondary to systemic causes. However, correlate clinically if there is concern for acute cholecystitis. Evaluation of pericholecystic fat stranding is markedly limited due to extensive motion at this level. Pancreas: Unremarkable. No pancreatic ductal dilatation or surrounding inflammatory changes. Spleen: Within normal limits. No focal lesion. Adrenals/Urinary Tract: Adrenal glands are unremarkable. No suspicious renal  mass. No hydronephrosis. No renal or ureteric calculi. Unremarkable urinary bladder. Stomach/Bowel: No disproportionate dilation of the small or large bowel loops. No evidence of abnormal bowel wall thickening or inflammatory changes. The appendix was not visualized; however there is no acute inflammatory process in the right lower quadrant. There are multiple diverticula mainly in the left hemi colon, without imaging signs of diverticulitis. Vascular/Lymphatic: There is trace ascites mainly in the dependent pelvis and in the bilateral paracolic gutters. No walled-off abscess or loculated collection. No pneumoperitoneum. No abdominal or pelvic lymphadenopathy, by size criteria. No aneurysmal dilation of the major abdominal arteries. There are mild peripheral atherosclerotic vascular calcifications of the aorta and its major branches. Reproductive: Normal size prostate. Symmetric seminal vesicles.  Other: There are small fat containing umbilical and right inguinal hernias. The soft tissues and abdominal wall are otherwise unremarkable. Musculoskeletal: No suspicious osseous lesions. There are moderate multilevel degenerative changes in the visualized spine. Redemonstration of mild compression deformities of L2 through L4 vertebrae without significant interval change. No significant retropulsion or spinal canal compromise. IMPRESSION: 1. Exam degraded by patient's motion during data acquisition. 2. No acute inflammatory process identified within the abdomen or pelvis. 3. There is small amount of layering gallstones/sludge. There is mild diffuse gallbladder wall thickening, which is favored most likely secondary to systemic causes. However, correlate clinically if there is concern for acute cholecystitis. 4. Multiple other nonacute observations, as described above. Aortic Atherosclerosis (ICD10-I70.0). Electronically Signed   By: Beula Brunswick M.D.   On: 12/08/2023 08:40   CT HEAD WO CONTRAST ( ) Result Date:  12/08/2023 CLINICAL DATA:  Intoxication EXAM: CT HEAD WITHOUT CONTRAST TECHNIQUE: Contiguous axial images were obtained from the base of the skull through the vertex without intravenous contrast. RADIATION DOSE REDUCTION: This exam was performed according to the departmental dose-optimization program which includes automated exposure control, adjustment of the mA and/or kV according to patient size and/or use of iterative reconstruction technique. COMPARISON:  MRI from 08/09/2023 FINDINGS: Brain: No evidence of acute infarction, hemorrhage, hydrocephalus, extra-axial collection or mass lesion/mass effect. Chronic atrophic changes are noted. Vascular: No hyperdense vessel or unexpected calcification. Skull: Normal. Negative for fracture or focal lesion. Sinuses/Orbits: No acute finding. Other: None. IMPRESSION: Chronic atrophic changes without acute abnormality. Electronically Signed   By: Violeta Grey M.D.   On: 12/08/2023 03:46   DG Chest Port 1 View Result Date: 12/07/2023 CLINICAL DATA:  Shortness of breath EXAM: PORTABLE CHEST 1 VIEW COMPARISON:  12/06/2023 FINDINGS: Borderline to mild cardiomegaly. Mild diffuse interstitial opacity compared to prior suggestive of mild interstitial edema. No sizable effusion. No consolidation or pneumothorax. Chronic appearing fracture deformity of the right clavicle and right ribs. IMPRESSION: Borderline to mild cardiomegaly with mild diffuse interstitial opacity suggestive of mild interstitial edema. Electronically Signed   By: Esmeralda Hedge M.D.   On: 12/07/2023 20:58    Scheduled Meds:  enoxaparin  (LOVENOX ) injection  65 mg Subcutaneous Q12H   folic acid   1 mg Oral Daily   metoprolol  tartrate  25 mg Oral BID   multivitamin with minerals  1 tablet Oral Daily   thiamine   100 mg Oral Daily   Or   thiamine   100 mg Intravenous Daily    Continuous Infusions:  dextrose  100 mL/hr at 12/08/23 1340   thiamine  (VITAMIN B1) injection Stopped (12/08/23 1420)     LOS:  0 days     Veronica Gordon, MD [facility]  If 7PM-7AM, please contact night-coverage [email or web address] 12/08/2023, 6:22 PM

## 2023-12-08 NOTE — ED Notes (Signed)
 PT has become agitated and is trying to get out of bed. Has taken leads off and his gown.

## 2023-12-08 NOTE — Progress Notes (Signed)
 Latest Reference Range & Units 12/08/23 17:18  Sample type  ARTERIAL  pH, Arterial 7.35 - 7.45  7.311 (L)  pCO2 arterial 32 - 48 mmHg 19.5 (LL)  pO2, Arterial 83 - 108 mmHg 375 (H)  TCO2 22 - 32 mmol/L 10 (L)  Acid-base deficit 0.0 - 2.0 mmol/L 14.0 (H)  Bicarbonate 20.0 - 28.0 mmol/L 9.8 (L)  O2 Saturation % 100    RT called to bedside by RN due to not being able to obtain a good wave form on the pt's oxygen. Upon arrival pt is on 15L nonrebreather, with pulse ox on finger along with bedside pulse ox. Pulse ox had been tried on the finger, ear, and forehead with readings in the 60s. RT obtained ABG with above results.

## 2023-12-08 NOTE — Progress Notes (Signed)
 CSW added substance abuse resources to patient's AVS.  Edwin Dada, MSW, LCSW Transitions of Care  Clinical Social Worker II [phone]

## 2023-12-08 NOTE — ED Notes (Signed)
 PT still combative and attempting to get out of bed and non-violent restraints

## 2023-12-08 NOTE — ED Notes (Signed)
PT is now resting .

## 2023-12-08 NOTE — ED Notes (Signed)
 PT did not eat breakfast.

## 2023-12-08 NOTE — Progress Notes (Signed)
 PCCM INTERVAL PROGRESS NOTE   Acidosis workup revealed lactic acid 7.9. He is not hypotensive or hypoxemic. I re-examined him to evaluate for limb ischemia and abdominal tenderness with concern for bowel ischemia where are unremarkable. Sending a volatiles panel. He is a chronic alcohol  user and liver cirrhosis would explain a non-clearing lactic acid, but his LFTs are only mildly elevated and his liver does not appear cirrhotic on imaging.   I worry he is at high risk of decompensation. Will transfer to ICU for closer monitoring.  Additional LR bolus.  Trend lactic.  Consider CT angio abdomen to look for gut ischemia.    Anthony [name], AGACNP-BC [facility]  See Amion for personal pager PCCM on call pager [phone] until 7pm. Please call Elink 7p-7a. [phone]  12/08/2023 11:45 PM

## 2023-12-08 NOTE — Consult Note (Signed)
 NAME:  Anthony Sion Reinders., MRN:  [id number], DOB:  Nov 23, 1955, LOS: 0 ADMISSION DATE:  12/07/2023 CONSULTATION DATE:  12/08/2023 REFERRING MD:  Hester Lot - [facility], CHIEF COMPLAINT:  Agitation, c/f EtOH withdrawal   History of Present Illness:  68 year old man who presented to [facility] ED 5/20 for SOB. PMHx significant for HTN, aortic atherosclerosis, PAF, PE (previously on Eliquis ), EtOH abuse c/b withdrawal/DTs, tobacco abuse, hiatal hernia (s/p Nissen 2016), chronic low back pain, anxiety. Patient is also unhoused. Recent presentation to ED 5/19 requesting EtOH detox; patient was deemed to be not actively in EtOH withdrawal and was discharged, however upon discharge patient threatened staff and was escorted off of the premises by [employer].  On ED arrival, patient was afebrile, HR 92, BP 139/117, RR 30, SpO2 90%. Labs were notable for WBC 3.2, Hgb 7.5, Plt 220. Na 139, K 3.7, CO2 14, Cr 0.72. Glucose 61. AST 74, ALT 28, Alk Phos 112, Tbili 0.8. Lipase WNL. Ethanol 57 (221). TSH WNL. Head with chronic atrophic changes without acute abnormality. CT A/P without acute process noted. RUQ US  with GB sludge, +GB wall thickening/edema, no stones or ductal dilatation. CIWA protocol initiated. Admitted by [facility] for EtOH withdrawal, delirium and hypoglycemia.   While awaiting bed placement, patient became intermittently agitated requiring Ativan  administration per CIWA and soft wrist restraints to prevent harm. ED RN was unable to obtain good waveform on SpO2 monitor (reading 60s-70s) without change in mentation or ability to protect airway. ABG was obtained with pH 7.31/pCO2 19.5/pO2 375/bicarb 9.8, suggesting SpO2 not accurate.   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  Per his chart from [place] , he fell off two storyhouse roof while cleaning his brother's gutters and sustained compression fracture of L3, L4 and L5 and fracture of right transverse process at L3 and nondisplaced fracture of left glenoid rim many years ago. 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 And 12/2017 admissions    History of pulmonary embolus (PE) 11/02/2016   Unprovoked 11/01/2016. Xarelto  from 11/01/16 to 09/08/2017.   Hydrocele    Surgically corrected   Hypertension    Hypoalbuminemia due to protein-calorie malnutrition (HCC) 07/22/2020   Lumbar compression fracture (HCC)    Multiple rib fractures 07/22/2019   Left rib details XR: left ribs demonstrate multiple remote rib   Pancytopenia (HCC)    Rhabdomyolysis 07/22/2020   Skin cancer    exciced 2017   Tinea versicolor 06/08/2013   Tobacco use disorder 07/22/2020   Tooth infection 04/25/2018   Trigger finger, acquired 11/18/2012   Ulnar tunnel syndrome of right wrist 11/08/2012   Significant Hospital Events: Including procedures, antibiotic start and stop dates in addition to other pertinent  events   5/20 - Presented to [facility] for SOB. Concern for EtOH withdrawal. 5/21 - PCCM consulted for intermittent agitation, ?Precedex /ICU.  Interim History / Subjective:  PCCM consulted for ?Precedex , ? ICU admission.  Objective:   Blood pressure (!) 148/120, pulse 60,  temperature (!) 97.5 F (36.4 C), resp. rate 20, height 5\' 8"  (1.727 m), weight 65 kg, SpO2 (!) 70%.        Intake/Output Summary (Last 24 hours) at 12/08/2023 1828 Last data filed at 12/08/2023 1420 Gross per 24 hour  Intake 53.18 ml  Output --  Net 53.18 ml   Filed Weights   12/07/23 1804  Weight: 65 kg   Physical Examination: General: Chronically ill-appearing male in NAD. HEENT: [place]/AT, anicteric sclera, PERRL, moist mucous membranes. Neuro: Sedated. Responds to verbal stimuli. Following commands intermittently. Moves all 4 extremities spontaneously.  CV: RRR, no m/g/r. PULM: Breathing even and unlabored on room air. Lung fields clear. Sats 100% GI: Soft, nontender, nondistended. Normoactive bowel sounds. Extremities: trace LE edema noted.  Resolved Hospital Problem List:    Assessment & Plan:  EtOH withdrawal with delirium tremens History of EtOH abuse - Monitor for signs/symptoms of withdrawal - Would not recommend Precedex  at this time - Continue with CIWA ativan . Currently he is calm.  - Would recommend phenobarb protocol. Defer to primary.  - CIWA protocol with  - Continue thiamine /folate - MV when able to tolerate PO  Hypoxemia: patient satting in the 70s despite oxygen therapy. ABG showed PO2 357 and sat 100%. Pulse oximeter now reading 100% on room air with a reasonable waveform. Lung clear. PE history with questionable at best DOAC compliance.  - Doppler legs - Supplemental O2 PRN for sat > 92% - tx dose lovenox  - No need for ICU at this time.  Possible metabolic acidosis Bicbarb on ABG reading 9, was 19 on chemistry this morning. Urine ketones negative. - repeat chemistry  Hypoglycemia - d10 infusion per primary.   HTN Aortic atherosclerosis PAF PE (previously on Eliquis ) - Cardiac monitoring - Optimize electrolytes for K > 4, Mg > 2 - Lovenox  for now - Resume home medications as appropriate  Hiatal hernia (s/p Nissen 2016) - PPI  Chronic low  back pain - Supportive care  Tobacco abuse Homelessness/unhoused patient - TOC consult for substance abuse/cessation resources, medication assistance  PCCM will sign off at this time. Thank you for involving us  in this patient's care. Please do not hesitate to reach out if patient's clinical condition changes or if PCCM can be of further assistance.  Best Practice: (right click and "Reselect all SmartList Selections" daily)   Per Primary Team  Labs:  CBC: Recent Labs  Lab 12/06/23 0034 12/06/23 2116 12/07/23 2340 12/08/23 0316 12/08/23 1718  WBC 4.8 4.4 3.2* 3.5*  --   NEUTROABS 2.2 2.2 1.3* 1.2*  --   HGB 8.4* 8.4* 7.5* 8.1* 10.5*  HCT 27.4* 27.2* 24.3* 26.1* 31.0*  MCV 96.1 96.8 96.4 94.6  --   PLT 255 262 220 235  --    Basic Metabolic Panel: Recent Labs  Lab 12/06/23 0034 12/06/23 0035 12/06/23 2116 12/07/23 2340 12/08/23 0316 12/08/23 1718  NA 132*  --  138 139 138 136  K 3.0*  --  3.5 3.7 4.4 4.5  CL 102  --  110 110 111  --   CO2 16*  --  16* 14* 19*  --   GLUCOSE 86  --  88 61* 95  --   BUN  14  --  10 12 11   --   CREATININE 0.65  --  0.67 0.72 0.71  --   CALCIUM  8.3*  --  8.3* 8.0* 7.7*  --   MG  --  1.4*  --   --   --   --    GFR: Estimated Creatinine Clearance: 81.3 mL/min (by C-G formula based on SCr of 0.71 mg/dL). Recent Labs  Lab 12/06/23 0034 12/06/23 2116 12/07/23 2340 12/08/23 0316  WBC 4.8 4.4 3.2* 3.5*   Liver Function Tests: Recent Labs  Lab 12/06/23 0034 12/06/23 2116 12/07/23 2340 12/08/23 0316  AST 68* 73* 74* 79*  ALT 34 31 28 29   ALKPHOS 136* 104 112 116  BILITOT 0.8 0.5 0.8 1.3*  PROT 7.4 7.1 6.3* 6.5  ALBUMIN  3.6 3.3* 3.0* 3.0*   Recent Labs  Lab 12/06/23 2116 12/07/23 2340  LIPASE 49 30   Recent Labs  Lab 12/08/23 0430  AMMONIA 39*   ABG:    Component Value Date/Time   PHART 7.311 (L) 12/08/2023 1718   PCO2ART 19.5 (LL) 12/08/2023 1718   PO2ART 375 (H) 12/08/2023 1718   HCO3 9.8 (L) 12/08/2023 1718    TCO2 10 (L) 12/08/2023 1718   ACIDBASEDEF 14.0 (H) 12/08/2023 1718   O2SAT 100 12/08/2023 1718    Coagulation Profile: Recent Labs  Lab 12/06/23 0034  INR 1.2   Cardiac Enzymes: No results for input(s): "CKTOTAL", "CKMB", "CKMBINDEX", "TROPONINI" in the last 168 hours.  HbA1C: Hgb A1c MFr Bld  Date/Time Value Ref Range Status  02/06/2023 03:26 AM 5.2 4.8 - 5.6 % Final    Comment:    (NOTE) Pre diabetes:          5.7%-6.4%  Diabetes:              >6.4%  Glycemic control for   <7.0% adults with diabetes   12/04/2022 04:06 AM 4.9 4.8 - 5.6 % Final    Comment:    (NOTE) Pre diabetes:          5.7%-6.4%  Diabetes:              >6.4%  Glycemic control for   <7.0% adults with diabetes    CBG: Recent Labs  Lab 12/08/23 0634 12/08/23 0753 12/08/23 1054 12/08/23 1231 12/08/23 1557  GLUCAP 146* 134* 122* 121* 84   Review of Systems:   Patient is encephalopathic; therefore, history has been obtained from chart review.   Past Medical History:  He,  has a past medical history of Actinic keratosis (11/27/2016), 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) (07/18/2018), Alcohol  use disorder, severe, dependence (HCC) (09/16/2006), Alcoholism (HCC), Anxiety state (04/25/2018), Aortic atherosclerosis (HCC) (08/27/2022), Chronic low back pain (09/16/2006), Delirium tremens (HCC) (09/01/2017), Depression, Dry eye (11/23/2016), Foreign body in middle portion of esophagus (05/19/2022), Hiatal hernia (09/14/2016), History of delirium tremons (07/22/2020), History of pulmonary embolus (PE) (11/02/2016), Hydrocele, Hypertension, Hypoalbuminemia due to protein-calorie malnutrition (HCC) (07/22/2020), Lumbar compression fracture (HCC), Multiple rib fractures (07/22/2019), Pancytopenia (HCC), Rhabdomyolysis (07/22/2020), Skin cancer, Tinea versicolor (06/08/2013), Tobacco use disorder (07/22/2020), Tooth infection (04/25/2018), Trigger finger, acquired (11/18/2012), and Ulnar tunnel  syndrome of right wrist (11/08/2012).   Surgical History:   Past Surgical History:  Procedure Laterality Date   BIOPSY  05/21/2022   Procedure: BIOPSY;  Surgeon: Lajuan Pila, MD;  Location: [facility] ENDOSCOPY;  Service: Gastroenterology;;   CATARACT EXTRACTION     ESOPHAGOGASTRODUODENOSCOPY (EGD) WITH PROPOFOL  N/A 05/21/2022   Procedure: ESOPHAGOGASTRODUODENOSCOPY (EGD) WITH PROPOFOL ;  Surgeon: Lajuan Pila, MD;  Location: [facility] ENDOSCOPY;  Service: Gastroenterology;  Laterality: N/A;   FOREIGN BODY REMOVAL N/A 05/21/2022   Procedure: FOREIGN BODY REMOVAL;  Surgeon: Lajuan Pila, MD;  Location: [facility] ENDOSCOPY;  Service: Gastroenterology;  Laterality: N/A;   HIATAL HERNIA REPAIR  2016   HYDROCELE EXCISION / REPAIR  2010   SKIN CANCER EXCISION Left    Excised 2017     Social History:   reports that he has never smoked. His smokeless tobacco use includes snuff. He reports current alcohol  use of about 43.0 standard drinks of alcohol  per week. He reports current drug use. Drug: Marijuana.   Family History:  His family history includes Cancer in his sister; Dementia in his father; Heart attack in his father; Stroke in his father. There is no history of Colon cancer, Colon polyps, Esophageal cancer, Stomach cancer, or Rectal cancer.   Allergies: Allergies  Allergen Reactions   Other Itching and Other (See Comments)    Seasonal allergies- Itchy eyes, runny nose, congestion   Poison Ivy Extract Rash   Home Medications: Prior to Admission medications   Medication Sig Start Date End Date Taking? Authorizing Provider  acetaminophen  (TYLENOL ) 500 MG tablet Take 1 tablet (500 mg total) by mouth every 8 (eight) hours as needed. Patient taking differently: Take 1,000 mg by mouth every 6 (six) hours as needed (for pain). 10/13/22   Gherghe, Costin M, MD  apixaban  (ELIQUIS ) 5 MG TABS tablet Take 1 tablet (5 mg total) by mouth 2 (two) times daily. 08/27/23 10/26/23  Etter Hermann., MD  ferrous sulfate   325 (65 FE) MG tablet Take 1 tablet (325 mg total) by mouth daily with breakfast. 07/12/23 08/11/23  Danford, Willis Harter, MD  folic acid  (FOLVITE ) 1 MG tablet Take 1 tablet (1 mg total) by mouth daily. 07/12/23   Danford, Willis Harter, MD  magnesium  oxide (MAG-OX) 400 (240 Mg) MG tablet Take 1 tablet (400 mg total) by mouth 2 (two) times daily. 02/16/23   Singh, Prashant K, MD  metoprolol  succinate (TOPROL -XL) 25 MG 24 hr tablet Take 1 tablet (25 mg total) by mouth daily. 08/28/23 10/27/23  Etter Hermann., MD  mometasone -formoterol  (DULERA ) 200-5 MCG/ACT AERO Inhale 2 puffs into the lungs 2 (two) times daily. 02/16/23   Singh, Prashant K, MD  OLANZapine  (ZYPREXA ) 2.5 MG tablet Take 1 tablet (2.5 mg total) by mouth daily after lunch. 08/27/23 10/26/23  Etter Hermann., MD  OLANZapine  (ZYPREXA ) 5 MG tablet Take 1 tablet (5 mg total) by mouth at bedtime. 08/27/23   Etter Hermann., MD  pantoprazole  (PROTONIX ) 40 MG tablet Take 1 tablet (40 mg total) by mouth daily. 02/17/23   Singh, Prashant K, MD  rosuvastatin  (CRESTOR ) 10 MG tablet Take 1 tablet (10 mg total) by mouth daily. 07/12/23 10/10/23  DanfordWillis Harter, MD  thiamine  (VITAMIN B1) 100 MG tablet Take 1 tablet (100 mg total) by mouth daily. 07/12/23   DanfordWillis Harter, MD   Critical care time: N/A    Roz Cornelia, AGACNP-BC [facility]  See Amion for personal pager PCCM on call pager [phone] until 7pm. Please call Elink 7p-7a. [phone]  12/08/2023 7:42 PM

## 2023-12-08 NOTE — ED Notes (Signed)
 Pt resting, eyes closed. Breathing easy/unlabored.

## 2023-12-08 NOTE — ED Notes (Signed)
 PT was able to take meds and sip water . PT said "thank you"

## 2023-12-08 NOTE — ED Notes (Signed)
 Informed Dr. [facility] Lavender that patient's blood sugar is 64 after being on the D5NS infusion.

## 2023-12-08 NOTE — ED Notes (Signed)
 PT is tachycardic and hypertensive at this time. Physician paged out

## 2023-12-08 NOTE — ED Notes (Signed)
 Informed Dr. [facility] Lavender that I recently took over the care of the patient and noticed that the patient's HR has been elevated (HR in the 130s) for a while and that the only thing running is D5NS @100  mL/hr.

## 2023-12-08 NOTE — Progress Notes (Signed)
 PHARMACY - ANTICOAGULATION CONSULT NOTE  Pharmacy Consult for enoxaparin  Indication: atrial fibrillation and pulmonary embolus  Allergies  Allergen Reactions   Other Itching and Other (See Comments)    Seasonal allergies- Itchy eyes, runny nose, congestion   Poison Ivy Extract Rash    Patient Measurements: Height: 5\' 8"  (172.7 cm) Weight: 65 kg (143 lb 4.8 oz) IBW/kg (Calculated) : 68.4 HEPARIN  DW (KG): 65  Vital Signs: Temp: 97.9 F (36.6 C) (05/21 0214) Temp Source: Oral (05/21 0214) BP: 120/100 (05/21 0600) Pulse Rate: 131 (05/21 0600)  Labs: Recent Labs    12/06/23 0034 12/06/23 2116 12/07/23 2340 12/08/23 0316  HGB 8.4* 8.4* 7.5* 8.1*  HCT 27.4* 27.2* 24.3* 26.1*  PLT 255 262 220 235  LABPROT 15.7*  --   --   --   INR 1.2  --   --   --   CREATININE 0.65 0.67 0.72 0.71    Estimated Creatinine Clearance: 81.3 mL/min (by C-G formula based on SCr of 0.71 mg/dL).   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  Per his chart from [place] , he fell off two storyhouse roof while cleaning his brother's gutters and sustained compression fracture of L3, L4 and L5 and fracture of right transverse process at L3 and nondisplaced fracture of left glenoid rim many years ago. He had multiple imaging in [place]  including Lumbar MRI in 2016 which showed moderate to severe spinal canal stenos   Delirium tremens (HCC) 09/01/2017   Depression    Dry eye 11/23/2016   Foreign body in middle portion of esophagus 05/19/2022   Hiatal hernia 09/14/2016   Status  Collis gastroplasty and Nissen's fundoplication on 04/29/2015 in [place]    History of delirium tremons 07/22/2020   DTs during 10/2016 08/2017. And 12/2017 admissions    History of pulmonary embolus (PE) 11/02/2016   Unprovoked 11/01/2016. Xarelto  from 11/01/16 to 09/08/2017.   Hydrocele    Surgically corrected   Hypertension    Hypoalbuminemia due to protein-calorie malnutrition (HCC) 07/22/2020   Lumbar compression fracture (HCC)    Multiple rib fractures 07/22/2019   Left rib details XR: left ribs demonstrate multiple remote rib   Pancytopenia (HCC)    Rhabdomyolysis 07/22/2020   Skin cancer    exciced 2017   Tinea versicolor 06/08/2013   Tobacco use disorder 07/22/2020   Tooth infection 04/25/2018   Trigger finger, acquired 11/18/2012   Ulnar tunnel syndrome of right wrist 11/08/2012    Assessment: 68yo male admitted for further w/u of acute delirium/encephalopathy, unclear if he has been taking his home meds, to transition from apixaban  for PE (Dec 2024) and Afib to LMWH.  Goal of Therapy:  Anti-Xa level 0.6-1 units/ml 4hrs after LMWH dose given Monitor platelets by anticoagulation protocol: Yes   Plan:  Enoxaparin  65mg  SQ Q12H. Monitor CBC.  Lonnie Roberts, PharmD, BCPS  12/08/2023,6:28 AM

## 2023-12-08 NOTE — Discharge Instructions (Signed)

## 2023-12-08 NOTE — H&P (Signed)
 History and Physical    Anthony A Leopard Jr. ZOX:[id number] DOB: August 15, 1955 DOA: 12/07/2023   Chief Complaint: Confusion and agitation.  HPI: Anthony [name]. is a 68 y.o. male with history of alcohol  abuse, PE who has had multiple prior hospital admission for delirium and discharge summary in February 2025 states that patient likely could have dementia had come to the ER with complaints of shortness of breath.  Patient as per the report  is homeless and not sure if he has been taking his medicines.  ED Course: Shortly after coming to the ER patient became more agitated and was given IV Ativan .  Labs show glucose of 61 was started on D5.  At the time of my exam patient appears confused and not following commands.  CT head is unremarkable.  Patient admitted for acute delirium with hypoglycemia.  Ammonia level is 39 alcohol  level is 61.  Review of Systems: As per HPI, rest all negative.   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  Per his chart from [place] , he fell off two storyhouse roof while cleaning his brother's gutters and sustained compression fracture of L3, L4 and L5 and fracture of right transverse process at L3 and nondisplaced fracture of left glenoid rim many years ago. He had multiple imaging in [place]  including Lumbar MRI in 2016 which showed moderate to severe spinal canal stenos   Delirium tremens (HCC) 09/01/2017   Depression    Dry eye 11/23/2016   Foreign body in middle portion of esophagus 05/19/2022   Hiatal hernia 09/14/2016   Status Collis gastroplasty and Nissen's  fundoplication on 04/29/2015 in [place]    History of delirium tremons 07/22/2020   DTs during 10/2016 08/2017. And 12/2017 admissions    History of pulmonary embolus (PE) 11/02/2016   Unprovoked 11/01/2016. Xarelto  from 11/01/16 to 09/08/2017.   Hydrocele    Surgically corrected   Hypertension    Hypoalbuminemia due to protein-calorie malnutrition (HCC) 07/22/2020   Lumbar compression fracture (HCC)    Multiple rib fractures 07/22/2019   Left rib details XR: left ribs demonstrate multiple remote rib   Pancytopenia (HCC)    Rhabdomyolysis 07/22/2020   Skin cancer    exciced 2017   Tinea versicolor 06/08/2013   Tobacco use disorder 07/22/2020   Tooth infection 04/25/2018   Trigger finger, acquired 11/18/2012   Ulnar tunnel syndrome of right wrist 11/08/2012    Past Surgical History:  Procedure Laterality Date   BIOPSY  05/21/2022   Procedure: BIOPSY;  Surgeon: Lajuan Pila, MD;  Location: [facility] ENDOSCOPY;  Service: Gastroenterology;;   CATARACT EXTRACTION     ESOPHAGOGASTRODUODENOSCOPY (EGD) WITH PROPOFOL  N/A 05/21/2022   Procedure: ESOPHAGOGASTRODUODENOSCOPY (EGD) WITH PROPOFOL ;  Surgeon: Lajuan Pila, MD;  Location: [facility] ENDOSCOPY;  Service: Gastroenterology;  Laterality: N/A;   FOREIGN BODY REMOVAL N/A 05/21/2022   Procedure: FOREIGN BODY REMOVAL;  Surgeon: Lajuan Pila, MD;  Location: [facility] ENDOSCOPY;  Service: Gastroenterology;  Laterality: N/A;   HIATAL HERNIA REPAIR  2016   HYDROCELE EXCISION / REPAIR  2010   SKIN CANCER EXCISION Left    Excised 2017  reports that he has never smoked. His smokeless tobacco use includes snuff. He reports current alcohol  use of about 43.0 standard drinks of alcohol  per week. He reports current drug use. Drug: Marijuana.  Allergies  Allergen Reactions   Other Itching and Other (See Comments)    Seasonal allergies- Itchy eyes, runny nose, congestion   Poison Ivy Extract Rash    Family History  Problem Relation Age of Onset   Heart  attack Father        died at 24 years   Dementia Father    Stroke Father    Cancer Sister    Colon cancer Neg Hx    Colon polyps Neg Hx    Esophageal cancer Neg Hx    Stomach cancer Neg Hx    Rectal cancer Neg Hx     Prior to Admission medications   Medication Sig Start Date End Date Taking? Authorizing Provider  acetaminophen  (TYLENOL ) 500 MG tablet Take 1 tablet (500 mg total) by mouth every 8 (eight) hours as needed. Patient taking differently: Take 1,000 mg by mouth every 6 (six) hours as needed (for pain). 10/13/22   Gherghe, Costin M, MD  apixaban  (ELIQUIS ) 5 MG TABS tablet Take 1 tablet (5 mg total) by mouth 2 (two) times daily. 08/27/23 10/26/23  Etter Hermann., MD  ferrous sulfate  325 (65 FE) MG tablet Take 1 tablet (325 mg total) by mouth daily with breakfast. 07/12/23 08/11/23  Danford, Willis Harter, MD  folic acid  (FOLVITE ) 1 MG tablet Take 1 tablet (1 mg total) by mouth daily. 07/12/23   Danford, Willis Harter, MD  magnesium  oxide (MAG-OX) 400 (240 Mg) MG tablet Take 1 tablet (400 mg total) by mouth 2 (two) times daily. 02/16/23   Singh, Prashant K, MD  metoprolol  succinate (TOPROL -XL) 25 MG 24 hr tablet Take 1 tablet (25 mg total) by mouth daily. 08/28/23 10/27/23  Etter Hermann., MD  mometasone -formoterol  (DULERA ) 200-5 MCG/ACT AERO Inhale 2 puffs into the lungs 2 (two) times daily. 02/16/23   Singh, Prashant K, MD  OLANZapine  (ZYPREXA ) 2.5 MG tablet Take 1 tablet (2.5 mg total) by mouth daily after lunch. 08/27/23 10/26/23  Etter Hermann., MD  OLANZapine  (ZYPREXA ) 5 MG tablet Take 1 tablet (5 mg total) by mouth at bedtime. 08/27/23   Etter Hermann., MD  pantoprazole  (PROTONIX ) 40 MG tablet Take 1 tablet (40 mg total) by mouth daily. 02/17/23   Singh, Prashant K, MD  rosuvastatin  (CRESTOR ) 10 MG tablet Take 1 tablet (10 mg total) by mouth daily. 07/12/23 10/10/23  DanfordWillis Harter, MD  thiamine  (VITAMIN B1) 100 MG tablet Take 1 tablet (100 mg total) by  mouth daily. 07/12/23   Danford, Willis Harter, MD    Physical Exam: Constitutional: Moderately built and nourished. Vitals:   12/08/23 0200 12/08/23 0214 12/08/23 0215 12/08/23 0300  BP: (!) 134/105  (!) 136/97 (!) 138/102  Pulse: (!) 140  (!) 136 (!) 141  Resp: (!) 22  (!) 28 (!) 26  Temp:  97.9 F (36.6 C)    TempSrc:  Oral    SpO2: 98%  97% 100%  Weight:      Height:       Eyes: Anicteric no pallor. ENMT: No discharge from the ears eyes nose or mouth. Neck: No mass felt.  No neck rigidity. Respiratory: No rhonchi or crepitations. Cardiovascular: S1-S2 heard. Abdomen: Soft nontender bowel sound present. Musculoskeletal: No edema. Skin: No rash. Neurologic: Patient is confused  does not follow commands.  Pupils are reactive to light. Psychiatric: Confused.   Labs on Admission: I have personally reviewed following labs and imaging studies  CBC: Recent Labs  Lab 12/06/23 0034 12/06/23 2116 12/07/23 2340  WBC 4.8 4.4 3.2*  NEUTROABS 2.2 2.2 1.3*  HGB 8.4* 8.4* 7.5*  HCT 27.4* 27.2* 24.3*  MCV 96.1 96.8 96.4  PLT 255 262 220   Basic Metabolic Panel: Recent Labs  Lab 12/06/23 0034 12/06/23 0035 12/06/23 2116 12/07/23 2340  NA 132*  --  138 139  K 3.0*  --  3.5 3.7  CL 102  --  110 110  CO2 16*  --  16* 14*  GLUCOSE 86  --  88 61*  BUN 14  --  10 12  CREATININE 0.65  --  0.67 0.72  CALCIUM  8.3*  --  8.3* 8.0*  MG  --  1.4*  --   --    GFR: Estimated Creatinine Clearance: 81.3 mL/min (by C-G formula based on SCr of 0.72 mg/dL). Liver Function Tests: Recent Labs  Lab 12/06/23 0034 12/06/23 2116 12/07/23 2340  AST 68* 73* 74*  ALT 34 31 28  ALKPHOS 136* 104 112  BILITOT 0.8 0.5 0.8  PROT 7.4 7.1 6.3*  ALBUMIN  3.6 3.3* 3.0*   Recent Labs  Lab 12/06/23 2116 12/07/23 2340  LIPASE 49 30   No results for input(s): "AMMONIA" in the last 168 hours. Coagulation Profile: Recent Labs  Lab 12/06/23 0034  INR 1.2   Cardiac Enzymes: No results for  input(s): "CKTOTAL", "CKMB", "CKMBINDEX", "TROPONINI" in the last 168 hours. BNP (last 3 results) No results for input(s): "PROBNP" in the last 8760 hours. HbA1C: No results for input(s): "HGBA1C" in the last 72 hours. CBG: No results for input(s): "GLUCAP" in the last 168 hours. Lipid Profile: No results for input(s): "CHOL", "HDL", "LDLCALC", "TRIG", "CHOLHDL", "LDLDIRECT" in the last 72 hours. Thyroid Function Tests: No results for input(s): "TSH", "T4TOTAL", "FREET4", "T3FREE", "THYROIDAB" in the last 72 hours. Anemia Panel: No results for input(s): "VITAMINB12", "FOLATE", "FERRITIN", "TIBC", "IRON ", "RETICCTPCT" in the last 72 hours. Urine analysis:    Component Value Date/Time   COLORURINE YELLOW 07/30/2023 2031   APPEARANCEUR CLOUDY (A) 07/30/2023 2031   LABSPEC 1.014 07/30/2023 2031   PHURINE 8.0 07/30/2023 2031   GLUCOSEU NEGATIVE 07/30/2023 2031   HGBUR NEGATIVE 07/30/2023 2031   BILIRUBINUR NEGATIVE 07/30/2023 2031   KETONESUR NEGATIVE 07/30/2023 2031   PROTEINUR NEGATIVE 07/30/2023 2031   UROBILINOGEN 1.0 10/30/2010 1345   NITRITE NEGATIVE 07/30/2023 2031   LEUKOCYTESUR NEGATIVE 07/30/2023 2031   Sepsis Labs: @LABRCNTIP (procalcitonin:4,lacticidven:4) )No results found for this or any previous visit (from the past 240 hours).   Radiological Exams on Admission: DG Chest Port 1 View Result Date: 12/07/2023 CLINICAL DATA:  Shortness of breath EXAM: PORTABLE CHEST 1 VIEW COMPARISON:  12/06/2023 FINDINGS: Borderline to mild cardiomegaly. Mild diffuse interstitial opacity compared to prior suggestive of mild interstitial edema. No sizable effusion. No consolidation or pneumothorax. Chronic appearing fracture deformity of the right clavicle and right ribs. IMPRESSION: Borderline to mild cardiomegaly with mild diffuse interstitial opacity suggestive of mild interstitial edema. Electronically Signed   By: Esmeralda Hedge M.D.   On: 12/07/2023 20:58       Assessment/Plan Principal Problem:   Alcohol  withdrawal (HCC) Active Problems:   Paroxysmal atrial fibrillation (HCC)   HTN (hypertension)   Macrocytic anemia   History of pulmonary embolus (PE)   Alcohol  withdrawal delirium (HCC)  Acute delirium    Hypoglycemia -    likely from poor oral intake with alcohol  abuse.  Patient is started on D5 but remained persistently hypoglycemic so started on D10.  Thiamine  500 mg IV was given before starting D5.  Closely follow CBGs.  Check C-peptide levels beta-hydroxybutyrate acid levels proinsulin levels.  Will check CT abdomen. Acute delirium/encephalopathy likely multifactorial including alcohol  abuse, hypoglycemia also concern for dementia.  TSH and ammonia levels unremarkable.  Will check B12 EEG.  Patient does respond to verbal stimuli do not think patient is having active seizures.  Thiamine .  Alcohol  withdrawal protocol. Chronic anemia follow-up CBC check anemia panel. History of PE not sure if patient has been taking his Eliquis .  Will keep patient on Lovenox  dose for now until patient can have reliable history. History of paroxysmal atrial fibrillation presently in sinus tachycardia.   History of chronic HFrEF noncompliant with medications so patient was not placed on GDMT.  Last EF was 45 to 50% in December 2024.  Receiving D10 at this time.  Since patient has persistent hypoglycemia with encephalopathy and delirium will need close monitoring and more than 2 midnight stay.   DVT prophylaxis: Lovenox  full dose. Code Status: Full code. Family Communication: Unable to reach family. Disposition Plan: Progressive care. Consults called: Child psychotherapist. Admission status: Inpatient.

## 2023-12-08 NOTE — ED Notes (Signed)
 Can't get a good wave form on O2, RT consulted and are bedside at this time

## 2023-12-08 NOTE — ED Notes (Signed)
 PT has become combative, ripped his iv out and keeps taking his leads and gown off. PT was cleaned up and order for soft restraints was requested.

## 2023-12-09 ENCOUNTER — Inpatient Hospital Stay (HOSPITAL_COMMUNITY)

## 2023-12-09 ENCOUNTER — Encounter (HOSPITAL_COMMUNITY): Payer: Self-pay

## 2023-12-09 DIAGNOSIS — F10131 Alcohol abuse with withdrawal delirium: Secondary | ICD-10-CM | POA: Diagnosis not present

## 2023-12-09 DIAGNOSIS — J9601 Acute respiratory failure with hypoxia: Secondary | ICD-10-CM | POA: Diagnosis not present

## 2023-12-09 DIAGNOSIS — E162 Hypoglycemia, unspecified: Secondary | ICD-10-CM | POA: Diagnosis not present

## 2023-12-09 DIAGNOSIS — R609 Edema, unspecified: Secondary | ICD-10-CM | POA: Diagnosis not present

## 2023-12-09 DIAGNOSIS — I10 Essential (primary) hypertension: Secondary | ICD-10-CM | POA: Diagnosis not present

## 2023-12-09 DIAGNOSIS — E8729 Other acidosis: Secondary | ICD-10-CM

## 2023-12-09 DIAGNOSIS — I509 Heart failure, unspecified: Secondary | ICD-10-CM

## 2023-12-09 LAB — RESPIRATORY PANEL BY PCR
Adenovirus: NOT DETECTED
Bordetella Parapertussis: NOT DETECTED
Bordetella pertussis: NOT DETECTED
Chlamydophila pneumoniae: NOT DETECTED
Coronavirus 229E: DETECTED — AB
Coronavirus HKU1: NOT DETECTED
Coronavirus NL63: NOT DETECTED
Coronavirus OC43: NOT DETECTED
Influenza A: NOT DETECTED
Influenza B: NOT DETECTED
Metapneumovirus: NOT DETECTED
Mycoplasma pneumoniae: NOT DETECTED
Parainfluenza Virus 1: NOT DETECTED
Parainfluenza Virus 2: NOT DETECTED
Parainfluenza Virus 3: NOT DETECTED
Parainfluenza Virus 4: NOT DETECTED
Respiratory Syncytial Virus: NOT DETECTED
Rhinovirus / Enterovirus: NOT DETECTED

## 2023-12-09 LAB — CBC
HCT: 31.4 % — ABNORMAL LOW (ref 39.0–52.0)
HCT: 32.6 % — ABNORMAL LOW (ref 39.0–52.0)
Hemoglobin: 9.3 g/dL — ABNORMAL LOW (ref 13.0–17.0)
Hemoglobin: 9.2 g/dL — ABNORMAL LOW (ref 13.0–17.0)
MCH: 29.2 pg (ref 26.0–34.0)
MCH: 29.5 pg (ref 26.0–34.0)
MCHC: 29.6 g/dL — ABNORMAL LOW (ref 30.0–36.0)
MCHC: 28.2 g/dL — ABNORMAL LOW (ref 30.0–36.0)
MCV: 98.4 fL (ref 80.0–100.0)
MCV: 104.5 fL — ABNORMAL HIGH (ref 80.0–100.0)
Platelets: 267 10*3/uL (ref 150–400)
Platelets: 225 10*3/uL (ref 150–400)
RBC: 3.19 MIL/uL — ABNORMAL LOW (ref 4.22–5.81)
RBC: 3.12 MIL/uL — ABNORMAL LOW (ref 4.22–5.81)
RDW: 19.7 % — ABNORMAL HIGH (ref 11.5–15.5)
RDW: 19.8 % — ABNORMAL HIGH (ref 11.5–15.5)
WBC: 6.4 10*3/uL (ref 4.0–10.5)
WBC: 7.6 10*3/uL (ref 4.0–10.5)
nRBC: 0 % (ref 0.0–0.2)
nRBC: 0.4 % — ABNORMAL HIGH (ref 0.0–0.2)

## 2023-12-09 LAB — BASIC METABOLIC PANEL WITH GFR
Anion gap: 12 (ref 5–15)
BUN: 11 mg/dL (ref 8–23)
CO2: 19 mmol/L — ABNORMAL LOW (ref 22–32)
Calcium: 7.6 mg/dL — ABNORMAL LOW (ref 8.9–10.3)
Chloride: 102 mmol/L (ref 98–111)
Creatinine, Ser: 0.9 mg/dL (ref 0.61–1.24)
GFR, Estimated: >60 mL/min (ref >60)
Glucose, Bld: 124 mg/dL — ABNORMAL HIGH (ref 70–99)
Potassium: 4.3 mmol/L (ref 3.5–5.1)
Sodium: 133 mmol/L — ABNORMAL LOW (ref 135–145)

## 2023-12-09 LAB — POCT I-STAT 7, (LYTES, BLD GAS, ICA,H+H)
Acid-base deficit: 16 mmol/L — ABNORMAL HIGH (ref 0.0–2.0)
Bicarbonate: 8.9 mmol/L — ABNORMAL LOW (ref 20.0–28.0)
Calcium, Ion: 1.08 mmol/L — ABNORMAL LOW (ref 1.15–1.40)
HCT: 31 % — ABNORMAL LOW (ref 39.0–52.0)
Hemoglobin: 10.5 g/dL — ABNORMAL LOW (ref 13.0–17.0)
O2 Saturation: 96 %
Patient temperature: 97.9
Potassium: 4.6 mmol/L (ref 3.5–5.1)
Sodium: 134 mmol/L — ABNORMAL LOW (ref 135–145)
TCO2: 9 mmol/L — ABNORMAL LOW (ref 22–32)
pCO2 arterial: 18.3 mmHg — CL (ref 32–48)
pH, Arterial: 7.292 — ABNORMAL LOW (ref 7.35–7.45)
pO2, Arterial: 83 mmHg (ref 83–108)

## 2023-12-09 LAB — COMPREHENSIVE METABOLIC PANEL WITH GFR
ALT: 217 U/L — ABNORMAL HIGH (ref 0–44)
AST: 1404 U/L — ABNORMAL HIGH (ref 15–41)
Albumin: 2.9 g/dL — ABNORMAL LOW (ref 3.5–5.0)
Alkaline Phosphatase: 120 U/L (ref 38–126)
Anion gap: 17 — ABNORMAL HIGH (ref 5–15)
BUN: 12 mg/dL (ref 8–23)
CO2: 9 mmol/L — ABNORMAL LOW (ref 22–32)
Calcium: 7.9 mg/dL — ABNORMAL LOW (ref 8.9–10.3)
Chloride: 107 mmol/L (ref 98–111)
Creatinine, Ser: 0.93 mg/dL (ref 0.61–1.24)
GFR, Estimated: >60 mL/min (ref >60)
Glucose, Bld: 97 mg/dL (ref 70–99)
Potassium: 4.3 mmol/L (ref 3.5–5.1)
Sodium: 133 mmol/L — ABNORMAL LOW (ref 135–145)
Total Bilirubin: 2.4 mg/dL — ABNORMAL HIGH (ref 0.0–1.2)
Total Protein: 6.5 g/dL (ref 6.5–8.1)

## 2023-12-09 LAB — LACTIC ACID, PLASMA
Lactic Acid, Venous: >9 mmol/L (ref 0.5–1.9)
Lactic Acid, Venous: 7 mmol/L (ref 0.5–1.9)

## 2023-12-09 LAB — GLUCOSE, CAPILLARY
Glucose-Capillary: 103 mg/dL — ABNORMAL HIGH (ref 70–99)
Glucose-Capillary: 117 mg/dL — ABNORMAL HIGH (ref 70–99)
Glucose-Capillary: 172 mg/dL — ABNORMAL HIGH (ref 70–99)
Glucose-Capillary: 64 mg/dL — ABNORMAL LOW (ref 70–99)
Glucose-Capillary: 72 mg/dL (ref 70–99)
Glucose-Capillary: 80 mg/dL (ref 70–99)
Glucose-Capillary: 88 mg/dL (ref 70–99)
Glucose-Capillary: 89 mg/dL (ref 70–99)
Glucose-Capillary: 93 mg/dL (ref 70–99)

## 2023-12-09 LAB — VOLATILES,BLD-ACETONE,ETHANOL,ISOPROP,METHANOL
Acetone, blood: <0.01 g/dL (ref 0.000–0.010)
Ethanol, blood: <0.01 g/dL (ref 0.000–0.010)
Isopropanol, blood: <0.01 g/dL (ref 0.000–0.010)
Methanol, blood: <0.01 g/dL (ref 0.000–0.010)

## 2023-12-09 LAB — PROCALCITONIN: Procalcitonin: 0.21 ng/mL

## 2023-12-09 LAB — PHOSPHORUS: Phosphorus: 3.3 mg/dL (ref 2.5–4.6)

## 2023-12-09 LAB — C-PEPTIDE: C-Peptide: 1.9 ng/mL (ref 1.1–4.4)

## 2023-12-09 LAB — MRSA NEXT GEN BY PCR, NASAL: MRSA by PCR Next Gen: NOT DETECTED

## 2023-12-09 LAB — MAGNESIUM: Magnesium: 1.3 mg/dL — ABNORMAL LOW (ref 1.7–2.4)

## 2023-12-09 LAB — OSMOLALITY: Osmolality: 294 mosm/kg (ref 275–295)

## 2023-12-09 MED ORDER — MAGNESIUM SULFATE 4 GM/100ML IV SOLN
4.0000 g | Freq: Once | INTRAVENOUS | Status: AC
  Administered 2023-12-09: 4 g via INTRAVENOUS
  Filled 2023-12-09: qty 100

## 2023-12-09 MED ORDER — CALCIUM GLUCONATE-NACL 1-0.675 GM/50ML-% IV SOLN
1.0000 g | Freq: Once | INTRAVENOUS | Status: AC
  Administered 2023-12-09: 1000 mg via INTRAVENOUS
  Filled 2023-12-09 (×2): qty 50

## 2023-12-09 MED ORDER — SODIUM CHLORIDE 0.9% FLUSH: 10.0000 mL | INTRAVENOUS | Status: DC | PRN

## 2023-12-09 MED ORDER — METRONIDAZOLE 500 MG/100ML IV SOLN
500.0000 mg | Freq: Two times a day (BID) | INTRAVENOUS | Status: DC
  Administered 2023-12-09 – 2023-12-13 (×10): 500 mg via INTRAVENOUS
  Filled 2023-12-09 (×10): qty 100

## 2023-12-09 MED ORDER — SODIUM CHLORIDE 0.9 % IV SOLN
2.0000 g | INTRAVENOUS | Status: DC
  Administered 2023-12-09 – 2023-12-14 (×6): 2 g via INTRAVENOUS
  Filled 2023-12-09 (×6): qty 20

## 2023-12-09 MED ORDER — THIAMINE HCL 100 MG/ML IJ SOLN
250.0000 mg | INTRAVENOUS | Status: AC
  Administered 2023-12-10 – 2023-12-12 (×3): 250 mg via INTRAVENOUS
  Filled 2023-12-09 (×3): qty 2.5

## 2023-12-09 MED ORDER — CHLORDIAZEPOXIDE HCL 25 MG PO CAPS
25.0000 mg | ORAL_CAPSULE | Freq: Three times a day (TID) | ORAL | Status: DC
  Administered 2023-12-09 (×2): 25 mg via ORAL
  Filled 2023-12-09 (×3): qty 1

## 2023-12-09 MED ORDER — SODIUM CHLORIDE 0.9% FLUSH
10.0000 mL | Freq: Two times a day (BID) | INTRAVENOUS | Status: DC
  Administered 2023-12-09 – 2023-12-14 (×10): 10 mL
  Administered 2023-12-14: 30 mL
  Administered 2023-12-15 – 2023-12-19 (×9): 10 mL
  Administered 2023-12-19: 20 mL
  Administered 2023-12-20 – 2024-01-03 (×25): 10 mL
  Administered 2024-01-03: 20 mL
  Administered 2024-01-04 – 2024-01-13 (×21): 10 mL
  Administered 2024-01-14: 20 mL
  Administered 2024-01-14 – 2024-01-20 (×12): 10 mL
  Administered 2024-01-21: 40 mL
  Administered 2024-01-21 – 2024-01-27 (×13): 10 mL

## 2023-12-09 MED ORDER — ORAL CARE MOUTH RINSE: 15.0000 mL | OROMUCOSAL | Status: DC | PRN

## 2023-12-09 MED ORDER — CHLORHEXIDINE GLUCONATE CLOTH 2 % EX PADS
6.0000 | MEDICATED_PAD | Freq: Every day | CUTANEOUS | Status: DC
  Administered 2023-12-09 – 2023-12-23 (×15): 6 via TOPICAL

## 2023-12-09 MED ORDER — THIAMINE HCL 100 MG/ML IJ SOLN
500.0000 mg | Freq: Three times a day (TID) | INTRAMUSCULAR | Status: AC
  Administered 2023-12-09 – 2023-12-10 (×3): 500 mg via INTRAVENOUS
  Filled 2023-12-09 (×3): qty 5

## 2023-12-09 MED ORDER — ACETAMINOPHEN 325 MG PO TABS
650.0000 mg | ORAL_TABLET | Freq: Four times a day (QID) | ORAL | Status: DC | PRN
Start: 2023-12-09 — End: 2023-12-20
  Administered 2023-12-09 – 2023-12-18 (×7): 650 mg via ORAL
  Filled 2023-12-09 (×8): qty 2

## 2023-12-09 MED ORDER — DEXMEDETOMIDINE HCL IN NACL 400 MCG/100ML IV SOLN
0.0000 ug/kg/h | INTRAVENOUS | Status: DC
  Administered 2023-12-09: 0.4 ug/kg/h via INTRAVENOUS
  Administered 2023-12-10: 0.5 ug/kg/h via INTRAVENOUS
  Administered 2023-12-11: 0.4 ug/kg/h via INTRAVENOUS
  Filled 2023-12-09 (×3): qty 100

## 2023-12-09 MED ORDER — MAGNESIUM SULFATE 2 GM/50ML IV SOLN
2.0000 g | Freq: Once | INTRAVENOUS | Status: AC
  Administered 2023-12-09: 2 g via INTRAVENOUS
  Filled 2023-12-09: qty 50

## 2023-12-09 MED ORDER — THIAMINE MONONITRATE 100 MG PO TABS
100.0000 mg | ORAL_TABLET | Freq: Every day | ORAL | Status: DC
Start: 2023-12-13 — End: 2023-12-13
  Administered 2023-12-13: 100 mg via ORAL
  Filled 2023-12-09: qty 1

## 2023-12-09 MED ORDER — SODIUM BICARBONATE 8.4 % IV SOLN
INTRAVENOUS | Status: DC
  Filled 2023-12-09 (×3): qty 1000

## 2023-12-09 MED ORDER — DEXMEDETOMIDINE HCL IN NACL 400 MCG/100ML IV SOLN
INTRAVENOUS | Status: AC
  Filled 2023-12-09: qty 100

## 2023-12-09 MED ORDER — ENOXAPARIN SODIUM 80 MG/0.8ML IJ SOSY
70.0000 mg | PREFILLED_SYRINGE | Freq: Two times a day (BID) | INTRAMUSCULAR | Status: DC
  Administered 2023-12-09 – 2023-12-16 (×14): 70 mg via SUBCUTANEOUS
  Filled 2023-12-09 (×15): qty 0.7

## 2023-12-09 MED ORDER — THIAMINE HCL 100 MG/ML IJ SOLN: 100.0000 mg | Freq: Every day | INTRAMUSCULAR | Status: DC

## 2023-12-09 MED ORDER — PHENOBARBITAL SODIUM 130 MG/ML IJ SOLN
130.0000 mg | Freq: Once | INTRAMUSCULAR | Status: AC
  Administered 2023-12-09: 130 mg via INTRAVENOUS
  Filled 2023-12-09: qty 1

## 2023-12-09 MED ORDER — CHLORHEXIDINE GLUCONATE 0.12 % MT SOLN
15.0000 mL | Freq: Once | OROMUCOSAL | Status: AC
  Administered 2023-12-09: 15 mL via OROMUCOSAL

## 2023-12-09 NOTE — Plan of Care (Signed)
  Problem: Education: Goal: Knowledge of General Education information will improve Description: Including pain rating scale, medication(s)/side effects and non-pharmacologic comfort measures Outcome: Not Progressing   Problem: Clinical Measurements: Goal: Respiratory complications will improve Outcome: Progressing   Problem: Nutrition: Goal: Adequate nutrition will be maintained Outcome: Not Progressing   Problem: Coping: Goal: Level of anxiety will decrease Outcome: Not Progressing   Problem: Safety: Goal: Ability to remain free from injury will improve Outcome: Progressing

## 2023-12-09 NOTE — Progress Notes (Signed)
 BLE venous duplex has been completed.   Results can be found under chart review under CV PROC. 12/09/2023 2:42 PM Terren Haberle RVT, RDMS

## 2023-12-09 NOTE — Progress Notes (Signed)
 Pts lips cyanotic and o2 sats are hard to pick up and reading are inaccurate. 2L o2 via [place] placed. Pt pulling at all lines and trying to get OOB. Pt not redirectable at this time. Md notified.

## 2023-12-09 NOTE — Consult Note (Signed)
 NAME:  Anthony Monroe Qin., MRN:  [id number], DOB:  1956/06/09, LOS: 1 ADMISSION DATE:  12/07/2023 CONSULTATION DATE:  12/08/2023 REFERRING MD:  Hester Lot - TRH, CHIEF COMPLAINT:  Agitation, c/f EtOH withdrawal   History of Present Illness:  68 year old man who presented to [facility] ED 5/20 for SOB. PMHx significant for HTN, aortic atherosclerosis, PAF, PE (previously on Eliquis ), EtOH abuse c/b withdrawal/DTs, tobacco abuse, hiatal hernia (s/p Nissen 2016), chronic low back pain, anxiety. Patient is also unhoused. Recent presentation to ED 5/19 requesting EtOH detox; patient was deemed to be not actively in EtOH withdrawal and was discharged, however upon discharge patient threatened staff and was escorted off of the premises by [employer].  On ED arrival, patient was afebrile, HR 92, BP 139/117, RR 30, SpO2 90%. Labs were notable for WBC 3.2, Hgb 7.5, Plt 220. Na 139, K 3.7, CO2 14, Cr 0.72. Glucose 61. AST 74, ALT 28, Alk Phos 112, Tbili 0.8. Lipase WNL. Ethanol 57 (221). TSH WNL. Head with chronic atrophic changes without acute abnormality. CT A/P without acute process noted. RUQ US  with GB sludge, +GB wall thickening/edema, no stones or ductal dilatation. CIWA protocol initiated. Admitted by TRH for EtOH withdrawal, delirium and hypoglycemia.   While awaiting bed placement, patient became intermittently agitated requiring Ativan  administration per CIWA and soft wrist restraints to prevent harm. ED RN was unable to obtain good waveform on SpO2 monitor (reading 60s-70s) without change in mentation or ability to protect airway. ABG was obtained with pH 7.31/pCO2 19.5/pO2 375/bicarb 9.8, suggesting SpO2 not accurate.   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  Per his chart from [place] , he fell off two storyhouse roof while cleaning his brother's gutters and sustained compression fracture of L3, L4 and L5 and fracture of right transverse process at L3 and nondisplaced fracture of left glenoid rim many years ago. 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 And 12/2017 admissions    History of pulmonary embolus (PE) 11/02/2016   Unprovoked 11/01/2016. Xarelto  from 11/01/16 to 09/08/2017.   Hydrocele    Surgically corrected   Hypertension    Hypoalbuminemia due to protein-calorie malnutrition (HCC) 07/22/2020   Lumbar compression fracture (HCC)    Multiple rib fractures 07/22/2019   Left rib details XR: left ribs demonstrate multiple remote rib   Pancytopenia (HCC)    Rhabdomyolysis 07/22/2020   Skin cancer    exciced 2017   Tinea versicolor 06/08/2013   Tobacco use disorder 07/22/2020   Tooth infection 04/25/2018   Trigger finger, acquired 11/18/2012   Ulnar tunnel syndrome of right wrist 11/08/2012   Significant Hospital Events: Including procedures, antibiotic start and stop dates in addition to other pertinent  events   5/20 - Presented to [facility] for SOB. Concern for EtOH withdrawal. 5/21 - PCCM consulted for intermittent agitation, ?Precedex /ICU.  Interim History / Subjective:  Agitated this morning, not redirectable to RN. Better with precedex  now. Ate some of his  breakfast.  Objective:   Blood pressure 108/86, pulse 93, temperature (!) 96.5 F (35.8 C), temperature source Axillary, resp. rate (!) 27, height 5\' 8"  (1.727 m), weight 70.4 kg, SpO2 100%.        Intake/Output Summary (Last 24 hours) at 12/09/2023 0859 Last data filed at 12/09/2023 0500 Gross per 24 hour  Intake 2727.94 ml  Output --  Net 2727.94 ml   Filed Weights   12/07/23 1804 12/09/23 0000  Weight: 65 kg 70.4 kg   Physical Examination: General: ill appearing man lying in bed in NAD HEENT: [place]/AT Neuro: sedated on precedex , somewhat responsive to stimulation during exam CV: S1S2,RRR PULM: breathing comfortably on [place], faint rhales ZO:XWRU, NT Extremities: minimal LE edema, no cellulitis  Resolved Hospital Problem List:    Assessment & Plan:  EtOH withdrawal with delirium tremens History of EtOH abuse -vitamins -con't phenobarb, added librium  scheduled -ativan  RPN -precedex  if none of those things work -high dose thiamine   Lactic acidosis; concern this may be related to chronic ETOH abuse, but sepsis with biliary source is also possible.  -recheck level now -IVF -high dose thiamine  -trend -Empirically treating for sepsis with ceftriaxone ; follow blood cultures and monitor for other sources of sepsis.Check RUQ US  with elevating LFTs.  -check RVP  Acute hypoxic respiratory failure; pulse ox has been unreliable but previous ABGs have been reassurring. CXR with likely acute pulmonary edema. PE history with questionable at best DOAC compliance.  - Doppler legs pending -eventually needs lasix ; trend lactate -supplemental O2 to maintain SpO2 >90% -full dose lovenox  -check RVP  Hypomagnesemia -replete,monitor  Hyperbilirubinemia, elevated transaminases - could be due to biliary pathology vs sepsis. RUQ with sludge and wall thickening concerning for cholecystitis. -RUQ US ; NPO until this is done -empiric ceftriaxone   Hyperammonemia -hold lactulose    Hypoglycemia- not sure why. Sepsis vs decompensated liver disease is possible -d10w; titrate off as able -thiamine   Hypervolemic hyponatremia -eventually needs diuresis -trying to avoid hypotonic fluids  Chronic heart failure with borderline EF/ systolic dysfunction; EF 45-50% -eventually needs diuresis -borderline on needing GDMT -tele monitoring  H/o HTN Aortic atherosclerosis PAF; currently sinus H/o PE (previously on Eliquis ) - tele monitoring - lovenox  -monitor electrolytes and replete as needed -not currently needing antihypertensives  Hiatal hernia (s/p Nissen 2016) - con't PPI  Chronic low back pain - Supportive care; can add lidocaine  patch as needed -PT,OT when able to participate  Tobacco abuse Homelessness/unhoused patient - TOC consult for substance abuse/cessation resources, medication assistance    Best Practice: (right click and "Reselect all SmartList Selections" daily)   DVT: lovenox  therapeutic GI: PPI   Labs:  CBC: Recent Labs  Lab 12/06/23 0034 12/06/23 2116 12/07/23 2340 12/08/23 0316 12/08/23 1718 12/09/23 0208 12/09/23 0542 12/09/23 0718  WBC 4.8 4.4 3.2* 3.5*  --  6.4  --  7.6  NEUTROABS 2.2 2.2 1.3* 1.2*  --   --   --   --   HGB 8.4* 8.4* 7.5* 8.1* 10.5* 9.3* 10.5* 9.2*  HCT 27.4* 27.2* 24.3* 26.1* 31.0* 31.4* 31.0* 32.6*  MCV 96.1 96.8 96.4 94.6  --  98.4  --  104.5*  PLT 255 262 220 235  --  267  --  225  Basic Metabolic Panel: Recent Labs  Lab 12/06/23 0035 12/06/23 2116 12/07/23 2340 12/08/23 0316 12/08/23 1718 12/08/23 2210 12/09/23 0542 12/09/23 0718  NA  --  138 139 138 136 136 134* 133*  K  --  3.5 3.7 4.4 4.5 4.4 4.6 4.3  CL  --  110 110 111  --  106  --  107  CO2  --  16* 14* 19*  --  12*  --  9*  GLUCOSE  --  88 61* 95  --  179*  --  97  BUN  --  10 12 11   --  12  --  12  CREATININE  --  0.67 0.72 0.71  --  0.93  --  0.93  CALCIUM   --  8.3* 8.0* 7.7*  --  8.0*  --  7.9*  MG 1.4*  --   --   --   --    --   --  1.3*  PHOS  --   --   --   --   --   --   --  3.3   GFR: Estimated Creatinine Clearance: 73.5 mL/min (by C-G formula based on SCr of 0.93 mg/dL). Recent Labs  Lab 12/07/23 2340 12/08/23 0316 12/08/23 2210 12/09/23 0208 12/09/23 0718  PROCALCITON  --   --   --  0.21  --   WBC 3.2* 3.5*  --  6.4 7.6  LATICACIDVEN  --   --  7.9* >9.0*  --    Liver Function Tests: Recent Labs  Lab 12/06/23 0034 12/06/23 2116 12/07/23 2340 12/08/23 0316 12/09/23 0718  AST 68* 73* 74* 79* 1,404*  ALT 34 31 28 29  217*  ALKPHOS 136* 104 112 116 120  BILITOT 0.8 0.5 0.8 1.3* 2.4*  PROT 7.4 7.1 6.3* 6.5 6.5  ALBUMIN  3.6 3.3* 3.0* 3.0* 2.9*   Recent Labs  Lab 12/06/23 2116 12/07/23 2340  LIPASE 49 30   Recent Labs  Lab 12/08/23 0430  AMMONIA 39*    Critical care time:    This patient is critically ill with multiple organ system failure which requires frequent high complexity decision making, assessment, support, evaluation, and titration of therapies. This was completed through the application of advanced monitoring technologies and extensive interpretation of multiple databases. During this encounter critical care time was devoted to patient care services described in this note for 45 minutes.  Joesph Mussel, DO 12/09/23 9:53 AM [facility]  For contact information, see Amion. If no response to pager, please call PCCM consult pager. After hours, 7PM- 7AM, please call Elink.

## 2023-12-09 NOTE — Progress Notes (Signed)
 Unable to obtain oxygen saturation reading on the monitor despite all troubleshooting methods. Tried 3 different cords for the monitor and several different sat probes on ears, forehead, fingers. Waveform is appearing on monitor, but no reading. Ativan  was given for CIWA score and RR 30s.  Sleeping now and more calm.  Notified Stephanie, RN w/ [facility]om[facility] of being unable to obtain an oxygen sat reading.

## 2023-12-09 NOTE — Plan of Care (Signed)

## 2023-12-09 NOTE — Progress Notes (Signed)

## 2023-12-09 NOTE — Progress Notes (Addendum)
 eLink Physician-Brief Progress Note Patient Name: Anthony A Dickerman Jr. DOB: 01-08-1956 MRN: [id number]   Date of Service  12/09/2023  HPI/Events of Note  68 year old male with a history of alcohol  use disorder complicated by encephalopathy, paroxysmal atrial fibrillation and history of pulmonary embolus who was referred to critical care for evaluation for elevated lactic acid and abdominal discomfort-transferred to the ICU for closer monitoring.  Patient is febrile, tachypneic, tachycardic, and saturating 76% on room air.  RASS -1 without medication.  Results consistent with worsening anion gap metabolic acidosis, hypocalcemia, and elevated creatinine.  Elevated lactic acid without a trend and previously elevated beta hydroxybutyrate.  eICU Interventions  Acetaminophen  for fevers, obtain blood cultures, urinalysis  Maintain dextrose  infusion 100 cc/h  Maintain scheduled phenobarbital  taper  GI prophylaxis with pantoprazole  DVT prophylaxis with enoxaparin    0454 -patient remains extremely agitated despite phenobarbital  and intermittent Ativan .  Persistent DTs.  Will add one-time dose of phenobarbital  130 mg.  Maintain current protocol otherwise  Intervention Category Evaluation Type: New Patient Evaluation  Anthony [name] 12/09/2023, 12:45 AM

## 2023-12-10 ENCOUNTER — Inpatient Hospital Stay (HOSPITAL_COMMUNITY)

## 2023-12-10 DIAGNOSIS — E162 Hypoglycemia, unspecified: Secondary | ICD-10-CM | POA: Diagnosis not present

## 2023-12-10 DIAGNOSIS — R7401 Elevation of levels of liver transaminase levels: Secondary | ICD-10-CM

## 2023-12-10 DIAGNOSIS — F10131 Alcohol abuse with withdrawal delirium: Secondary | ICD-10-CM | POA: Diagnosis not present

## 2023-12-10 DIAGNOSIS — E872 Acidosis, unspecified: Secondary | ICD-10-CM | POA: Diagnosis not present

## 2023-12-10 DIAGNOSIS — F10931 Alcohol use, unspecified with withdrawal delirium: Secondary | ICD-10-CM | POA: Diagnosis not present

## 2023-12-10 DIAGNOSIS — J9601 Acute respiratory failure with hypoxia: Secondary | ICD-10-CM | POA: Diagnosis not present

## 2023-12-10 DIAGNOSIS — R932 Abnormal findings on diagnostic imaging of liver and biliary tract: Secondary | ICD-10-CM | POA: Diagnosis not present

## 2023-12-10 DIAGNOSIS — I10 Essential (primary) hypertension: Secondary | ICD-10-CM | POA: Diagnosis not present

## 2023-12-10 LAB — CBC
HCT: 31.9 % — ABNORMAL LOW (ref 39.0–52.0)
Hemoglobin: 9.9 g/dL — ABNORMAL LOW (ref 13.0–17.0)
MCH: 29.6 pg (ref 26.0–34.0)
MCHC: 31 g/dL (ref 30.0–36.0)
MCV: 95.5 fL (ref 80.0–100.0)
Platelets: 205 10*3/uL (ref 150–400)
RBC: 3.34 MIL/uL — ABNORMAL LOW (ref 4.22–5.81)
RDW: 19.3 % — ABNORMAL HIGH (ref 11.5–15.5)
WBC: 8.5 10*3/uL (ref 4.0–10.5)
nRBC: 0 % (ref 0.0–0.2)

## 2023-12-10 LAB — COMPREHENSIVE METABOLIC PANEL WITH GFR
ALT: 375 U/L — ABNORMAL HIGH (ref 0–44)
ALT: 428 U/L — ABNORMAL HIGH (ref 0–44)
AST: 1774 U/L — ABNORMAL HIGH (ref 15–41)
AST: 1711 U/L — ABNORMAL HIGH (ref 15–41)
Albumin: 2.2 g/dL — ABNORMAL LOW (ref 3.5–5.0)
Albumin: 2.5 g/dL — ABNORMAL LOW (ref 3.5–5.0)
Alkaline Phosphatase: 98 U/L (ref 38–126)
Alkaline Phosphatase: 113 U/L (ref 38–126)
Anion gap: 6 (ref 5–15)
Anion gap: 10 (ref 5–15)
BUN: 9 mg/dL (ref 8–23)
BUN: 10 mg/dL (ref 8–23)
CO2: 25 mmol/L (ref 22–32)
CO2: 21 mmol/L — ABNORMAL LOW (ref 22–32)
Calcium: 6.9 mg/dL — ABNORMAL LOW (ref 8.9–10.3)
Calcium: 6.8 mg/dL — ABNORMAL LOW (ref 8.9–10.3)
Chloride: 99 mmol/L (ref 98–111)
Chloride: 99 mmol/L (ref 98–111)
Creatinine, Ser: 0.72 mg/dL (ref 0.61–1.24)
Creatinine, Ser: 0.73 mg/dL (ref 0.61–1.24)
GFR, Estimated: >60 mL/min (ref >60)
GFR, Estimated: >60 mL/min (ref >60)
Glucose, Bld: 121 mg/dL — ABNORMAL HIGH (ref 70–99)
Glucose, Bld: 142 mg/dL — ABNORMAL HIGH (ref 70–99)
Potassium: 3.4 mmol/L — ABNORMAL LOW (ref 3.5–5.1)
Potassium: 3.6 mmol/L (ref 3.5–5.1)
Sodium: 130 mmol/L — ABNORMAL LOW (ref 135–145)
Sodium: 130 mmol/L — ABNORMAL LOW (ref 135–145)
Total Bilirubin: 1.1 mg/dL (ref 0.0–1.2)
Total Bilirubin: 1.1 mg/dL (ref 0.0–1.2)
Total Protein: 5 g/dL — ABNORMAL LOW (ref 6.5–8.1)
Total Protein: 5.8 g/dL — ABNORMAL LOW (ref 6.5–8.1)

## 2023-12-10 LAB — BASIC METABOLIC PANEL WITH GFR
Anion gap: 9 (ref 5–15)
BUN: 11 mg/dL (ref 8–23)
CO2: 23 mmol/L (ref 22–32)
Calcium: 7.1 mg/dL — ABNORMAL LOW (ref 8.9–10.3)
Chloride: 101 mmol/L (ref 98–111)
Creatinine, Ser: 0.73 mg/dL (ref 0.61–1.24)
GFR, Estimated: >60 mL/min (ref >60)
Glucose, Bld: 129 mg/dL — ABNORMAL HIGH (ref 70–99)
Potassium: 3 mmol/L — ABNORMAL LOW (ref 3.5–5.1)
Sodium: 133 mmol/L — ABNORMAL LOW (ref 135–145)

## 2023-12-10 LAB — MAGNESIUM
Magnesium: 1.7 mg/dL (ref 1.7–2.4)
Magnesium: 2.2 mg/dL (ref 1.7–2.4)
Magnesium: 1.9 mg/dL (ref 1.7–2.4)

## 2023-12-10 LAB — GLUCOSE, CAPILLARY
Glucose-Capillary: 100 mg/dL — ABNORMAL HIGH (ref 70–99)
Glucose-Capillary: 142 mg/dL — ABNORMAL HIGH (ref 70–99)
Glucose-Capillary: 142 mg/dL — ABNORMAL HIGH (ref 70–99)
Glucose-Capillary: 191 mg/dL — ABNORMAL HIGH (ref 70–99)
Glucose-Capillary: 192 mg/dL — ABNORMAL HIGH (ref 70–99)
Glucose-Capillary: 56 mg/dL — ABNORMAL LOW (ref 70–99)

## 2023-12-10 LAB — PROTIME-INR
INR: 2.2 — ABNORMAL HIGH (ref 0.8–1.2)
Prothrombin Time: 24.7 s — ABNORMAL HIGH (ref 11.4–15.2)

## 2023-12-10 LAB — LACTIC ACID, PLASMA
Lactic Acid, Venous: 2.3 mmol/L (ref 0.5–1.9)
Lactic Acid, Venous: 3.7 mmol/L (ref 0.5–1.9)

## 2023-12-10 LAB — CK: Total CK: 2206 U/L — ABNORMAL HIGH (ref 49–397)

## 2023-12-10 LAB — GAMMA GT: GGT: 122 U/L — ABNORMAL HIGH (ref 7–50)

## 2023-12-10 LAB — CORTISOL: Cortisol, Plasma: 7.9 ug/dL

## 2023-12-10 MED ORDER — FUROSEMIDE 10 MG/ML IJ SOLN
40.0000 mg | Freq: Once | INTRAMUSCULAR | Status: AC
  Administered 2023-12-10: 40 mg via INTRAVENOUS
  Filled 2023-12-10: qty 4

## 2023-12-10 MED ORDER — HYDROCORTISONE SOD SUC (PF) 100 MG IJ SOLR
100.0000 mg | Freq: Two times a day (BID) | INTRAMUSCULAR | Status: DC
  Administered 2023-12-10 – 2023-12-12 (×5): 100 mg via INTRAVENOUS
  Filled 2023-12-10 (×5): qty 2

## 2023-12-10 MED ORDER — POTASSIUM CHLORIDE 10 MEQ/100ML IV SOLN
10.0000 meq | INTRAVENOUS | Status: DC
Start: 2023-12-10 — End: 2023-12-10

## 2023-12-10 MED ORDER — DEXTROSE 50 % IV SOLN
INTRAVENOUS | Status: AC
  Filled 2023-12-10: qty 50

## 2023-12-10 MED ORDER — MAGNESIUM SULFATE 2 GM/50ML IV SOLN
2.0000 g | Freq: Once | INTRAVENOUS | Status: AC
  Administered 2023-12-10: 2 g via INTRAVENOUS
  Filled 2023-12-10: qty 50

## 2023-12-10 MED ORDER — POTASSIUM CHLORIDE 10 MEQ/100ML IV SOLN
INTRAVENOUS | Status: AC
  Filled 2023-12-10: qty 100

## 2023-12-10 MED ORDER — POTASSIUM CHLORIDE 10 MEQ/100ML IV SOLN
10.0000 meq | INTRAVENOUS | Status: AC
  Administered 2023-12-10 (×6): 10 meq via INTRAVENOUS
  Filled 2023-12-10 (×5): qty 100

## 2023-12-10 MED ORDER — DEXTROSE 50 % IV SOLN
12.5000 g | INTRAVENOUS | Status: AC
  Administered 2023-12-10: 12.5 g via INTRAVENOUS

## 2023-12-10 MED ORDER — VITAL 1.5 CAL PO LIQD
1000.0000 mL | ORAL | Status: DC
  Administered 2023-12-10: 1000 mL

## 2023-12-10 MED ORDER — SODIUM CHLORIDE 0.9 % IV SOLN: 250.0000 mL | INTRAVENOUS | Status: AC

## 2023-12-10 MED ORDER — ARFORMOTEROL TARTRATE 15 MCG/2ML IN NEBU
15.0000 ug | INHALATION_SOLUTION | Freq: Two times a day (BID) | RESPIRATORY_TRACT | Status: DC
  Administered 2023-12-10 – 2023-12-15 (×8): 15 ug via RESPIRATORY_TRACT
  Filled 2023-12-10 (×11): qty 2

## 2023-12-10 MED ORDER — NOREPINEPHRINE 4 MG/250ML-% IV SOLN
0.0000 ug/min | INTRAVENOUS | Status: DC
  Administered 2023-12-10: 2 ug/min via INTRAVENOUS
  Administered 2023-12-11: 3 ug/min via INTRAVENOUS
  Filled 2023-12-10 (×2): qty 250

## 2023-12-10 MED ORDER — TECHNETIUM TC 99M MEBROFENIN IV KIT
7.5000 | PACK | Freq: Once | INTRAVENOUS | Status: AC | PRN
  Administered 2023-12-10: 7.5 via INTRAVENOUS

## 2023-12-10 MED ORDER — PROSOURCE TF20 ENFIT COMPATIBL EN LIQD
60.0000 mL | Freq: Every day | ENTERAL | Status: DC
  Administered 2023-12-10 – 2023-12-25 (×15): 60 mL
  Filled 2023-12-10 (×16): qty 60

## 2023-12-10 MED ORDER — REVEFENACIN 175 MCG/3ML IN SOLN
175.0000 ug | Freq: Every day | RESPIRATORY_TRACT | Status: DC
  Administered 2023-12-11 – 2023-12-15 (×5): 175 ug via RESPIRATORY_TRACT
  Filled 2023-12-10 (×6): qty 3

## 2023-12-10 NOTE — Plan of Care (Signed)
   Problem: Elimination: Goal: Will not experience complications related to bowel motility Outcome: Progressing   Problem: Safety: Goal: Ability to remain free from injury will improve Outcome: Progressing   Problem: Skin Integrity: Goal: Risk for impaired skin integrity will decrease Outcome: Progressing

## 2023-12-10 NOTE — Progress Notes (Signed)
 Patient appears very agitated. Attempting to exit the bed.  Pulling at mitts, trying to pull at IV lines.

## 2023-12-10 NOTE — Procedures (Signed)
 Cortrak  Person Inserting Tube:  Anthony [name], Kerstin Peeling, RD Tube Type:  Cortrak - 43 inches Tube Size:  10 Tube Location:  Left nare Initial Placement:  Stomach Secured by: Bridle Technique Used to Measure Tube Placement:  Marking at nare/corner of mouth Cortrak Secured At:  70 cm   Cortrak Tube Team Note:  Consult received to place a Cortrak feeding tube.   No x-ray is required. RN may begin using tube.   If the tube becomes dislodged please keep the tube and contact the Cortrak team at [email or web address] for replacement.  If after hours and replacement cannot be delayed, place a NG tube and confirm placement with an abdominal x-ray.    Frederik Jansky, RD Registered Dietitian  See Amion for more information
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
## 2023-12-10 NOTE — Progress Notes (Signed)
 Remains agitated. Oriented to self only.

## 2023-12-10 NOTE — Progress Notes (Signed)
 Patient now awake and agitated. Asks where he is located now.  Trying to pull off mitts and pull at NGT.  Putting leg over siderails.

## 2023-12-10 NOTE — Plan of Care (Signed)

## 2023-12-10 NOTE — Progress Notes (Signed)
 [facility] ADULT ICU REPLACEMENT PROTOCOL   The patient does apply for the [facility] Adult ICU Electrolyte Replacment Protocol based on the criteria listed below:   1.Exclusion criteria: TCTS, ECMO, Dialysis, and Myasthenia Gravis patients 2. Is GFR >/= 30 ml/min? Yes.    Patient's GFR today is >60 3. Is SCr </= 2? Yes.   Patient's SCr is 0.73 mg/dL 4. Did SCr increase >/= 0.5 in 24 hours? No. 5.Pt's weight >40kg  Yes.   6. Abnormal electrolyte(s): Potassium, Magnesium   7. Electrolytes replaced per protocol 8.  Call MD STAT for K+ </= 2.5, Phos </= 1, or Mag </= 1 Physician:  Dr. Claudetta Cuba A Metha Kolasa 12/10/2023 6:20 AM

## 2023-12-10 NOTE — Progress Notes (Signed)
 HIDA reviewed, consulting GI due to concern he may need ERCP  Joesph Mussel, DO 12/10/23 4:22 PM [facility]  For contact information, see Amion. If no response to pager, please call PCCM consult pager. After hours, 7PM- 7AM, please call Elink.
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
## 2023-12-11 ENCOUNTER — Inpatient Hospital Stay (HOSPITAL_COMMUNITY)

## 2023-12-11 DIAGNOSIS — E871 Hypo-osmolality and hyponatremia: Secondary | ICD-10-CM

## 2023-12-11 DIAGNOSIS — A419 Sepsis, unspecified organism: Secondary | ICD-10-CM

## 2023-12-11 DIAGNOSIS — R6521 Severe sepsis with septic shock: Secondary | ICD-10-CM | POA: Diagnosis not present

## 2023-12-11 DIAGNOSIS — I509 Heart failure, unspecified: Secondary | ICD-10-CM | POA: Diagnosis not present

## 2023-12-11 DIAGNOSIS — I5020 Unspecified systolic (congestive) heart failure: Secondary | ICD-10-CM | POA: Diagnosis not present

## 2023-12-11 LAB — COMPREHENSIVE METABOLIC PANEL WITH GFR
ALT: 415 U/L — ABNORMAL HIGH (ref 0–44)
ALT: 343 U/L — ABNORMAL HIGH (ref 0–44)
AST: 1472 U/L — ABNORMAL HIGH (ref 15–41)
AST: 1086 U/L — ABNORMAL HIGH (ref 15–41)
Albumin: 2.4 g/dL — ABNORMAL LOW (ref 3.5–5.0)
Albumin: 3 g/dL — ABNORMAL LOW (ref 3.5–5.0)
Alkaline Phosphatase: 118 U/L (ref 38–126)
Alkaline Phosphatase: 107 U/L (ref 38–126)
Anion gap: 9 (ref 5–15)
Anion gap: 12 (ref 5–15)
BUN: 12 mg/dL (ref 8–23)
BUN: 13 mg/dL (ref 8–23)
CO2: 22 mmol/L (ref 22–32)
CO2: 21 mmol/L — ABNORMAL LOW (ref 22–32)
Calcium: 6.9 mg/dL — ABNORMAL LOW (ref 8.9–10.3)
Calcium: 7.1 mg/dL — ABNORMAL LOW (ref 8.9–10.3)
Chloride: 98 mmol/L (ref 98–111)
Chloride: 97 mmol/L — ABNORMAL LOW (ref 98–111)
Creatinine, Ser: 0.73 mg/dL (ref 0.61–1.24)
Creatinine, Ser: 0.73 mg/dL (ref 0.61–1.24)
GFR, Estimated: >60 mL/min (ref >60)
GFR, Estimated: >60 mL/min (ref >60)
Glucose, Bld: 132 mg/dL — ABNORMAL HIGH (ref 70–99)
Glucose, Bld: 136 mg/dL — ABNORMAL HIGH (ref 70–99)
Potassium: 3.9 mmol/L (ref 3.5–5.1)
Potassium: 3.7 mmol/L (ref 3.5–5.1)
Sodium: 129 mmol/L — ABNORMAL LOW (ref 135–145)
Sodium: 130 mmol/L — ABNORMAL LOW (ref 135–145)
Total Bilirubin: 1 mg/dL (ref 0.0–1.2)
Total Bilirubin: 1 mg/dL (ref 0.0–1.2)
Total Protein: 5.6 g/dL — ABNORMAL LOW (ref 6.5–8.1)
Total Protein: 5.9 g/dL — ABNORMAL LOW (ref 6.5–8.1)

## 2023-12-11 LAB — BLOOD CULTURE ID PANEL (REFLEXED) - BCID2

## 2023-12-11 LAB — URINALYSIS, ROUTINE W REFLEX MICROSCOPIC
Bilirubin Urine: NEGATIVE
Glucose, UA: NEGATIVE mg/dL
Hgb urine dipstick: NEGATIVE
Ketones, ur: NEGATIVE mg/dL
Leukocytes,Ua: NEGATIVE
Nitrite: NEGATIVE
Protein, ur: NEGATIVE mg/dL
Specific Gravity, Urine: 1.013 (ref 1.005–1.030)
pH: 6 (ref 5.0–8.0)

## 2023-12-11 LAB — GLUCOSE, CAPILLARY
Glucose-Capillary: 133 mg/dL — ABNORMAL HIGH (ref 70–99)
Glucose-Capillary: 149 mg/dL — ABNORMAL HIGH (ref 70–99)
Glucose-Capillary: 125 mg/dL — ABNORMAL HIGH (ref 70–99)
Glucose-Capillary: 126 mg/dL — ABNORMAL HIGH (ref 70–99)
Glucose-Capillary: 155 mg/dL — ABNORMAL HIGH (ref 70–99)

## 2023-12-11 LAB — ECHOCARDIOGRAM LIMITED
Height: 68 in
S' Lateral: 4.4 cm
Weight: 2620.83 [oz_av]

## 2023-12-11 LAB — CORTISOL: Cortisol, Plasma: >100 ug/dL

## 2023-12-11 LAB — PHOSPHORUS
Phosphorus: 1.6 mg/dL — ABNORMAL LOW (ref 2.5–4.6)
Phosphorus: 2.4 mg/dL — ABNORMAL LOW (ref 2.5–4.6)

## 2023-12-11 LAB — BRAIN NATRIURETIC PEPTIDE: B Natriuretic Peptide: 794.2 pg/mL — ABNORMAL HIGH (ref 0.0–100.0)

## 2023-12-11 LAB — LACTIC ACID, PLASMA: Lactic Acid, Venous: 2.9 mmol/L (ref 0.5–1.9)

## 2023-12-11 LAB — MAGNESIUM
Magnesium: 1.7 mg/dL (ref 1.7–2.4)
Magnesium: 1.9 mg/dL (ref 1.7–2.4)

## 2023-12-11 MED ORDER — MAGNESIUM SULFATE 2 GM/50ML IV SOLN
2.0000 g | Freq: Once | INTRAVENOUS | Status: AC
  Administered 2023-12-11: 2 g via INTRAVENOUS
  Filled 2023-12-11: qty 50

## 2023-12-11 MED ORDER — SODIUM PHOSPHATES 45 MMOLE/15ML IV SOLN
45.0000 mmol | Freq: Once | INTRAVENOUS | Status: AC
  Administered 2023-12-11: 45 mmol via INTRAVENOUS
  Filled 2023-12-11: qty 15

## 2023-12-11 MED ORDER — MAGNESIUM SULFATE 4 GM/100ML IV SOLN: 4.0000 g | Freq: Once | INTRAVENOUS | Status: DC

## 2023-12-11 MED ORDER — ADULT MULTIVITAMIN W/MINERALS CH
1.0000 | ORAL_TABLET | Freq: Every day | ORAL | Status: DC
  Administered 2023-12-11 – 2023-12-14 (×4): 1
  Filled 2023-12-11 (×4): qty 1

## 2023-12-11 MED ORDER — DEXMEDETOMIDINE HCL IN NACL 400 MCG/100ML IV SOLN
0.0000 ug/kg/h | INTRAVENOUS | Status: DC
  Administered 2023-12-11 – 2023-12-12 (×2): 0.7 ug/kg/h via INTRAVENOUS
  Administered 2023-12-12: 0.6 ug/kg/h via INTRAVENOUS
  Filled 2023-12-11 (×3): qty 100

## 2023-12-11 MED ORDER — POTASSIUM CHLORIDE 20 MEQ PO PACK
40.0000 meq | PACK | Freq: Once | ORAL | Status: AC
  Administered 2023-12-11: 40 meq
  Filled 2023-12-11: qty 2

## 2023-12-11 MED ORDER — LORAZEPAM 2 MG/ML IJ SOLN
1.0000 mg | INTRAMUSCULAR | Status: AC | PRN
  Administered 2023-12-12 (×2): 4 mg via INTRAVENOUS
  Filled 2023-12-11 (×2): qty 2

## 2023-12-11 MED ORDER — LORAZEPAM 2 MG/ML IJ SOLN
0.0000 mg | Freq: Three times a day (TID) | INTRAMUSCULAR | Status: AC
  Administered 2023-12-13: 1 mg via INTRAVENOUS
  Filled 2023-12-11 (×2): qty 1

## 2023-12-11 MED ORDER — LORAZEPAM 2 MG/ML IJ SOLN
0.0000 mg | INTRAMUSCULAR | Status: AC
  Administered 2023-12-11: 2 mg via INTRAVENOUS
  Administered 2023-12-11 (×2): 1 mg via INTRAVENOUS
  Administered 2023-12-12 (×5): 2 mg via INTRAVENOUS
  Administered 2023-12-13: 1 mg via INTRAVENOUS
  Administered 2023-12-13: 4 mg via INTRAVENOUS
  Filled 2023-12-11 (×9): qty 1
  Filled 2023-12-11: qty 2
  Filled 2023-12-11: qty 1

## 2023-12-11 MED ORDER — VITAL 1.5 CAL PO LIQD
1000.0000 mL | ORAL | Status: DC
  Administered 2023-12-12 – 2023-12-20 (×7): 1000 mL
  Filled 2023-12-11 (×3): qty 1000

## 2023-12-11 MED ORDER — LORAZEPAM 1 MG PO TABS: 1.0000 mg | ORAL_TABLET | ORAL | Status: AC | PRN

## 2023-12-11 MED ORDER — ALBUMIN HUMAN 25 % IV SOLN
25.0000 g | Freq: Four times a day (QID) | INTRAVENOUS | Status: AC
  Administered 2023-12-11: 12.5 g via INTRAVENOUS
  Administered 2023-12-11: 25 g via INTRAVENOUS
  Administered 2023-12-11: 12.5 g via INTRAVENOUS
  Administered 2023-12-12: 25 g via INTRAVENOUS
  Filled 2023-12-11 (×3): qty 100

## 2023-12-11 NOTE — Plan of Care (Signed)
  Problem: Pain Managment: Goal: General experience of comfort will improve and/or be controlled Outcome: Progressing   Problem: Safety: Goal: Ability to remain free from injury will improve Outcome: Progressing   Problem: Skin Integrity: Goal: Risk for impaired skin integrity will decrease Outcome: Progressing

## 2023-12-11 NOTE — Progress Notes (Signed)
 [facility] ADULT ICU REPLACEMENT PROTOCOL   The patient does apply for the [facility] Adult ICU Electrolyte Replacment Protocol based on the criteria listed below:   1.Exclusion criteria: TCTS, ECMO, Dialysis, and Myasthenia Gravis patients 2. Is GFR >/= 30 ml/min? Yes.    Patient's GFR today is >60 3. Is SCr </= 2? Yes.   Patient's SCr is 0.73 mg/dL 4. Did SCr increase >/= 0.5 in 24 hours? No. 5.Pt's weight >40kg  Yes.   6. Abnormal electrolyte(s): Phosphorus, Magnesium   7. Electrolytes replaced per protocol 8.  Call MD STAT for K+ </= 2.5, Phos </= 1, or Mag </= 1 Physician:  Dr. Claudetta Cuba A Kavontae Pritchard 12/11/2023 5:43 AM
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
## 2023-12-11 NOTE — Progress Notes (Signed)
 Initial Nutrition Assessment  DOCUMENTATION CODES:   Non-severe (moderate) malnutrition in context of chronic illness  INTERVENTION:  TF via Cortrak: Vital 1.5 at 55 ml/h (1320 ml per day) *advance Vital 1.5 by 10ml q12h to goal rate Prosource TF20 60 ml once daily Provides 2060 kcal, 109 gm protein, 1008 ml free water  daily  Continue to monitor magnesium , potassium, and phosphorus daily for at least 3 days, MD to replete as needed, as pt is at risk for refeeding syndrome.  Continue MVI, folvite  and thiamine  in the setting of ETOH use.    NUTRITION DIAGNOSIS:   Moderate Malnutrition related to social / environmental circumstances (ETOH use, homeless) as evidenced by moderate fat depletion, moderate muscle depletion.  GOAL:   Patient will meet greater than or equal to 90% of their needs  MONITOR:   Diet advancement, Labs, Weight trends, TF tolerance  REASON FOR ASSESSMENT:   Consult Assessment of nutrition requirement/status  ASSESSMENT:   68 yo male admitted with EtOH withdrawal with DTs, lactic acidosis, concern for possible sepsis-?biliary source. PMH includes EtOH abuse with recurrent admissions for DTs, tobacco use, hiatal hernia s/p nissen (2016), HTN. Pt is homeless  5/21: CT and US  - findings of gallstones/sludge and GBW thickening; fatty liver, non cirrhotic; unremarkable pancreas 5/23: HIDA- normal GB filling but no emptying in the small bowel; Cortrak placed  Per GI, pt likely with a degree of alcoholic liver disease versus musculoskeletal injury (rhabdo) in setting of lactic acidosis.   Pt's weight is more elevated today (74.3 kg) from prior weights over the last year, ranging between 63-67 kg. Generalized edema documented.   Pt remains NPO with Cortrak in place.  Appears to be refeeding with significant decrease in phosphorus level this morning. Repletion ordered. Will initiate slow TF advancement.   Medications: ferrous sulfate , folvite , solu-cortef , MVI,  klor-con  (once), thiamine  Drips: Albumin  Abx D10 @ 1104ml/hr Levo @ 3 mcg/min Sodium phosphate  (once)  Labs:  Sodium 129 Calcium  corrected 8.18 Phosphorus 1.6 AST 1472 ALT 415 CBG's 133-192 x24 hours  NUTRITION - FOCUSED PHYSICAL EXAM: Flowsheet Row Most Recent Value  Orbital Region Moderate depletion  Upper Arm Region Moderate depletion  Thoracic and Lumbar Region Moderate depletion  Buccal Region Moderate depletion  Temple Region Moderate depletion  Clavicle Bone Region Moderate depletion  Clavicle and Acromion Bone Region Moderate depletion  Scapular Bone Region Moderate depletion  Dorsal Hand Unable to assess  Patellar Region Severe depletion  Anterior Thigh Region Severe depletion  Posterior Calf Region Unable to assess  Edema (RD Assessment) Mild   Diet Order:   Diet Order             Diet NPO time specified  Diet effective midnight                   EDUCATION NEEDS:   No education needs have been identified at this time  Skin:  Skin Assessment: Reviewed RN Assessment  Last BM:  unknown/PTA  Height:   Ht Readings from Last 1 Encounters:  12/09/23 5\' 8"  (1.727 m)    Weight:   Wt Readings from Last 1 Encounters:  12/11/23 74.3 kg    Ideal Body Weight:  70 kg  BMI:  Body mass index is 24.91 kg/m.  Estimated Nutritional Needs:   Kcal:  1900-2100  Protein:  95-110g  Fluid:  >/=1.9L  Rocklin Chute, RDN, LDN Clinical Nutrition See AMiON for contact information.

## 2023-12-11 NOTE — Progress Notes (Signed)
Routine EEG complete. Results pending.

## 2023-12-11 NOTE — Progress Notes (Signed)
 PHARMACY - PHYSICIAN COMMUNICATION CRITICAL VALUE ALERT - BLOOD CULTURE IDENTIFICATION (BCID)  Anthony A Baskett [name] is an 68 y.o. male who presented to [facility] on 12/07/2023 with a chief complaint of ETOH withdrawal and concern for sepsis secondary to biliary source.   Assessment:  1/4 Blood cultures positive for GNR with no resistance genes detected.   Name of physician (or Provider) Contacted: Ardelle Kos, MD  Current antibiotics: Ceftriaxone  / Metronidazole    Changes to prescribed antibiotics recommended:  Patient is on recommended antibiotics - No changes needed  Results for orders placed or performed during the hospital encounter of 12/07/23  Blood Culture ID Panel (Reflexed) (Collected: 12/09/2023  2:11 AM)  Result Value Ref Range   Enterococcus faecalis NOT DETECTED NOT DETECTED   Enterococcus Faecium NOT DETECTED NOT DETECTED   Listeria monocytogenes NOT DETECTED NOT DETECTED   Staphylococcus species NOT DETECTED NOT DETECTED   Staphylococcus aureus (BCID) NOT DETECTED NOT DETECTED   Staphylococcus epidermidis NOT DETECTED NOT DETECTED   Staphylococcus lugdunensis NOT DETECTED NOT DETECTED   Streptococcus species NOT DETECTED NOT DETECTED   Streptococcus agalactiae NOT DETECTED NOT DETECTED   Streptococcus pneumoniae NOT DETECTED NOT DETECTED   Streptococcus pyogenes NOT DETECTED NOT DETECTED   A.calcoaceticus-baumannii NOT DETECTED NOT DETECTED   Bacteroides fragilis NOT DETECTED NOT DETECTED   Enterobacterales NOT DETECTED NOT DETECTED   Enterobacter cloacae complex NOT DETECTED NOT DETECTED   Escherichia coli NOT DETECTED NOT DETECTED   Klebsiella aerogenes NOT DETECTED NOT DETECTED   Klebsiella oxytoca NOT DETECTED NOT DETECTED   Klebsiella pneumoniae NOT DETECTED NOT DETECTED   Proteus species NOT DETECTED NOT DETECTED   Salmonella species NOT DETECTED NOT DETECTED   Serratia marcescens NOT DETECTED NOT DETECTED   Haemophilus influenzae NOT DETECTED NOT DETECTED    Neisseria meningitidis NOT DETECTED NOT DETECTED   Pseudomonas aeruginosa NOT DETECTED NOT DETECTED   Stenotrophomonas maltophilia NOT DETECTED NOT DETECTED   Candida albicans NOT DETECTED NOT DETECTED   Candida auris NOT DETECTED NOT DETECTED   Candida glabrata NOT DETECTED NOT DETECTED   Candida krusei NOT DETECTED NOT DETECTED   Candida parapsilosis NOT DETECTED NOT DETECTED   Candida tropicalis NOT DETECTED NOT DETECTED   Cryptococcus neoformans/gattii NOT DETECTED NOT DETECTED    [name] 12/11/2023  10:20 AM
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
## 2023-12-12 ENCOUNTER — Inpatient Hospital Stay (HOSPITAL_COMMUNITY)

## 2023-12-12 DIAGNOSIS — E162 Hypoglycemia, unspecified: Secondary | ICD-10-CM | POA: Diagnosis not present

## 2023-12-12 DIAGNOSIS — I509 Heart failure, unspecified: Secondary | ICD-10-CM | POA: Diagnosis not present

## 2023-12-12 DIAGNOSIS — I5020 Unspecified systolic (congestive) heart failure: Secondary | ICD-10-CM | POA: Diagnosis not present

## 2023-12-12 DIAGNOSIS — R6521 Severe sepsis with septic shock: Secondary | ICD-10-CM | POA: Diagnosis not present

## 2023-12-12 DIAGNOSIS — E871 Hypo-osmolality and hyponatremia: Secondary | ICD-10-CM | POA: Diagnosis not present

## 2023-12-12 DIAGNOSIS — A419 Sepsis, unspecified organism: Secondary | ICD-10-CM | POA: Diagnosis not present

## 2023-12-12 DIAGNOSIS — R569 Unspecified convulsions: Secondary | ICD-10-CM

## 2023-12-12 LAB — CK: Total CK: 1457 U/L — ABNORMAL HIGH (ref 49–397)

## 2023-12-12 LAB — CBC
HCT: 25.2 % — ABNORMAL LOW (ref 39.0–52.0)
HCT: 26 % — ABNORMAL LOW (ref 39.0–52.0)
Hemoglobin: 8.1 g/dL — ABNORMAL LOW (ref 13.0–17.0)
Hemoglobin: 8.2 g/dL — ABNORMAL LOW (ref 13.0–17.0)
MCH: 29.3 pg (ref 26.0–34.0)
MCH: 29.5 pg (ref 26.0–34.0)
MCHC: 32.1 g/dL (ref 30.0–36.0)
MCHC: 31.5 g/dL (ref 30.0–36.0)
MCV: 91.3 fL (ref 80.0–100.0)
MCV: 93.5 fL (ref 80.0–100.0)
Platelets: 174 10*3/uL (ref 150–400)
Platelets: 179 10*3/uL (ref 150–400)
RBC: 2.76 MIL/uL — ABNORMAL LOW (ref 4.22–5.81)
RBC: 2.78 MIL/uL — ABNORMAL LOW (ref 4.22–5.81)
RDW: 19.4 % — ABNORMAL HIGH (ref 11.5–15.5)
RDW: 19.5 % — ABNORMAL HIGH (ref 11.5–15.5)
WBC: 5.9 10*3/uL (ref 4.0–10.5)
WBC: 6 10*3/uL (ref 4.0–10.5)
nRBC: 0 % (ref 0.0–0.2)
nRBC: 0 % (ref 0.0–0.2)

## 2023-12-12 LAB — COMPREHENSIVE METABOLIC PANEL WITH GFR
ALT: 280 U/L — ABNORMAL HIGH (ref 0–44)
AST: 661 U/L — ABNORMAL HIGH (ref 15–41)
Albumin: 3.1 g/dL — ABNORMAL LOW (ref 3.5–5.0)
Alkaline Phosphatase: 88 U/L (ref 38–126)
Anion gap: 8 (ref 5–15)
BUN: 9 mg/dL (ref 8–23)
CO2: 23 mmol/L (ref 22–32)
Calcium: 7.1 mg/dL — ABNORMAL LOW (ref 8.9–10.3)
Chloride: 103 mmol/L (ref 98–111)
Creatinine, Ser: 0.59 mg/dL — ABNORMAL LOW (ref 0.61–1.24)
GFR, Estimated: >60 mL/min (ref >60)
Glucose, Bld: 203 mg/dL — ABNORMAL HIGH (ref 70–99)
Potassium: 3.5 mmol/L (ref 3.5–5.1)
Sodium: 134 mmol/L — ABNORMAL LOW (ref 135–145)
Total Bilirubin: 0.8 mg/dL (ref 0.0–1.2)
Total Protein: 5.7 g/dL — ABNORMAL LOW (ref 6.5–8.1)

## 2023-12-12 LAB — HEPATIC FUNCTION PANEL
ALT: 278 U/L — ABNORMAL HIGH (ref 0–44)
AST: 723 U/L — ABNORMAL HIGH (ref 15–41)
Albumin: 3.2 g/dL — ABNORMAL LOW (ref 3.5–5.0)
Alkaline Phosphatase: 94 U/L (ref 38–126)
Bilirubin, Direct: 0.5 mg/dL — ABNORMAL HIGH (ref 0.0–0.2)
Indirect Bilirubin: 0.7 mg/dL (ref 0.3–0.9)
Total Bilirubin: 1.2 mg/dL (ref 0.0–1.2)
Total Protein: 5.9 g/dL — ABNORMAL LOW (ref 6.5–8.1)

## 2023-12-12 LAB — GLUCOSE, CAPILLARY
Glucose-Capillary: 101 mg/dL — ABNORMAL HIGH (ref 70–99)
Glucose-Capillary: 122 mg/dL — ABNORMAL HIGH (ref 70–99)
Glucose-Capillary: 157 mg/dL — ABNORMAL HIGH (ref 70–99)
Glucose-Capillary: 172 mg/dL — ABNORMAL HIGH (ref 70–99)
Glucose-Capillary: 174 mg/dL — ABNORMAL HIGH (ref 70–99)
Glucose-Capillary: 183 mg/dL — ABNORMAL HIGH (ref 70–99)
Glucose-Capillary: 187 mg/dL — ABNORMAL HIGH (ref 70–99)

## 2023-12-12 LAB — COOXEMETRY PANEL
Carboxyhemoglobin: 1.5 % (ref 0.5–1.5)
Methemoglobin: <0.7 % (ref 0.0–1.5)
O2 Saturation: 76.9 %
Total hemoglobin: 8.5 g/dL — ABNORMAL LOW (ref 12.0–16.0)

## 2023-12-12 LAB — BASIC METABOLIC PANEL WITH GFR
Anion gap: 11 (ref 5–15)
BUN: 12 mg/dL (ref 8–23)
CO2: 27 mmol/L (ref 22–32)
Calcium: 7.5 mg/dL — ABNORMAL LOW (ref 8.9–10.3)
Chloride: 98 mmol/L (ref 98–111)
Creatinine, Ser: 0.64 mg/dL (ref 0.61–1.24)
GFR, Estimated: >60 mL/min (ref >60)
Glucose, Bld: 99 mg/dL (ref 70–99)
Potassium: 3.1 mmol/L — ABNORMAL LOW (ref 3.5–5.1)
Sodium: 136 mmol/L (ref 135–145)

## 2023-12-12 LAB — PHOSPHORUS: Phosphorus: 1.9 mg/dL — ABNORMAL LOW (ref 2.5–4.6)

## 2023-12-12 LAB — MAGNESIUM
Magnesium: 1.6 mg/dL — ABNORMAL LOW (ref 1.7–2.4)
Magnesium: 1.6 mg/dL — ABNORMAL LOW (ref 1.7–2.4)

## 2023-12-12 MED ORDER — MAGNESIUM SULFATE 4 GM/100ML IV SOLN
4.0000 g | Freq: Once | INTRAVENOUS | Status: AC
  Administered 2023-12-12: 4 g via INTRAVENOUS
  Filled 2023-12-12: qty 100

## 2023-12-12 MED ORDER — SODIUM PHOSPHATES 45 MMOLE/15ML IV SOLN
30.0000 mmol | Freq: Once | INTRAVENOUS | Status: AC
  Administered 2023-12-12: 30 mmol via INTRAVENOUS
  Filled 2023-12-12: qty 10

## 2023-12-12 MED ORDER — ORAL CARE MOUTH RINSE
15.0000 mL | OROMUCOSAL | Status: DC
  Administered 2023-12-12 – 2024-01-26 (×158): 15 mL via OROMUCOSAL

## 2023-12-12 MED ORDER — ORAL CARE MOUTH RINSE: 15.0000 mL | OROMUCOSAL | Status: DC | PRN

## 2023-12-12 MED ORDER — HALOPERIDOL LACTATE 5 MG/ML IJ SOLN
5.0000 mg | Freq: Four times a day (QID) | INTRAMUSCULAR | Status: DC | PRN
  Administered 2023-12-12 – 2023-12-19 (×5): 5 mg via INTRAVENOUS
  Filled 2023-12-12 (×4): qty 1

## 2023-12-12 MED ORDER — FUROSEMIDE 10 MG/ML IJ SOLN
60.0000 mg | Freq: Two times a day (BID) | INTRAMUSCULAR | Status: DC
  Administered 2023-12-12 – 2023-12-14 (×4): 60 mg via INTRAVENOUS
  Filled 2023-12-12 (×4): qty 6

## 2023-12-12 MED ORDER — HYDROMORPHONE HCL 1 MG/ML IJ SOLN
0.5000 mg | INTRAMUSCULAR | Status: DC | PRN
  Administered 2023-12-12: 0.5 mg via INTRAVENOUS
  Filled 2023-12-12 (×2): qty 0.5

## 2023-12-12 MED ORDER — POTASSIUM CHLORIDE 10 MEQ/50ML IV SOLN
10.0000 meq | INTRAVENOUS | Status: AC
  Administered 2023-12-12 (×4): 10 meq via INTRAVENOUS
  Filled 2023-12-12: qty 50

## 2023-12-12 NOTE — Plan of Care (Signed)
  Problem: Safety: Goal: Non-violent Restraint(s) Outcome: Progressing   Problem: Education: Goal: Knowledge of General Education information will improve Description: Including pain rating scale, medication(s)/side effects and non-pharmacologic comfort measures Outcome: Progressing   Problem: Clinical Measurements: Goal: Will remain free from infection Outcome: Progressing Goal: Diagnostic test results will improve Outcome: Progressing Goal: Respiratory complications will improve Outcome: Progressing Goal: Cardiovascular complication will be avoided Outcome: Progressing   Problem: Activity: Goal: Risk for activity intolerance will decrease Outcome: Progressing   Problem: Nutrition: Goal: Adequate nutrition will be maintained Outcome: Progressing
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
## 2023-12-12 NOTE — Plan of Care (Signed)
  Problem: Safety: Goal: Non-violent Restraint(s) 12/12/2023 0713 by Ainsley Houston, RN Outcome: Progressing 12/12/2023 0713 by Ainsley Houston, RN Outcome: Progressing   Problem: Education: Goal: Knowledge of General Education information will improve Description: Including pain rating scale, medication(s)/side effects and non-pharmacologic comfort measures 12/12/2023 0713 by Ainsley Houston, RN Outcome: Progressing 12/12/2023 0713 by Ainsley Houston, RN Outcome: Progressing   Problem: Health Behavior/Discharge Planning: Goal: Ability to manage health-related needs will improve Outcome: Progressing   Problem: Clinical Measurements: Goal: Ability to maintain clinical measurements within normal limits will improve Outcome: Progressing Goal: Will remain free from infection 12/12/2023 0713 by Ainsley Houston, RN Outcome: Progressing 12/12/2023 0713 by Ainsley Houston, RN Outcome: Progressing Goal: Diagnostic test results will improve 12/12/2023 0713 by Ainsley Houston, RN Outcome: Progressing 12/12/2023 0713 by Ainsley Houston, RN Outcome: Progressing Goal: Respiratory complications will improve 12/12/2023 0713 by Ainsley Houston, RN Outcome: Progressing 12/12/2023 0713 by Ainsley Houston, RN Outcome: Progressing Goal: Cardiovascular complication will be avoided Outcome: Progressing   Problem: Activity: Goal: Risk for activity intolerance will decrease Outcome: Progressing   Problem: Nutrition: Goal: Adequate nutrition will be maintained Outcome: Progressing

## 2023-12-12 NOTE — Progress Notes (Signed)
 eLink Physician-Brief Progress Note Patient Name: Anthony [name]. DOB: [birth year] MRN: [id number]   Date of Service  12/12/2023  HPI/Events of Note  Afib rate has gone up to 135 from 107, going through CIWA at this time but complaining of pain but won't say what kind of pain, per BSR pt really hasn't been mentally to converse, asking for something for pain and to restart precedex - did well with it last night  eICU Interventions  Will give a trial of dilaudid 0.5 mg q 4 x 2 doses before resuming Precedex      Intervention Category Intermediate Interventions: Pain - evaluation and management;Other: Minor Interventions: Agitation / anxiety - evaluation and management  [name] 12/12/2023, 8:49 PM

## 2023-12-12 NOTE — Procedures (Signed)
 Patient Name: Anthony A Sanzone Jr.  MRN: [id number]  Epilepsy Attending: Arleene Lack  Referring Physician/Provider: Josiah Nigh, MD  Date: 12/11/2023 Duration: 27.48 mins  Patient history: 68yo M with septic shock getting eeg to evaluate for seizure  Level of alertness: awake/lethargic   AEDs during EEG study: Ativan   Technical aspects: This EEG study was done with scalp electrodes positioned according to the 10-20 International system of electrode placement. Electrical activity was reviewed with band pass filter of 1-70Hz , sensitivity of 7 uV/mm, display speed of 57mm/sec with a 60Hz  notched filter applied as appropriate. EEG data were recorded continuously and digitally stored.  Video monitoring was available and reviewed as appropriate.  Description: EEG showed continuous generalized 3 to 6 Hz theta-delta slowing. Hyperventilation and photic stimulation were not performed.   Of note, study was technically difficult due to significant myogenic artifact.    ABNORMALITY - Continuous slow, generalized  IMPRESSION: This technically difficult study is suggestive of moderate diffuse encephalopathy. No seizures or epileptiform discharges were seen throughout the recording.   [name]
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
## 2023-12-12 NOTE — Procedures (Signed)
 Central Venous Catheter Insertion Procedure Note  Chasyn Cinque  [id number]  1955-11-13  Date:12/12/23  Time:9:16 AM   Provider Performing:Kivon Aprea Tita Form Felipe Horton   Procedure: Insertion of Non-tunneled Central Venous Catheter(36556) with US  guidance (40981)   Indication(s) Medication administration  Consent Risks of the procedure as well as the alternatives and risks of each were explained to the patient and/or caregiver.  Consent for the procedure was obtained and is signed in the bedside chart  Anesthesia Topical only with 1% lidocaine    Timeout Verified patient identification, verified procedure, site/side was marked, verified correct patient position, special equipment/implants available, medications/allergies/relevant history reviewed, required imaging and test results available.  Sterile Technique Maximal sterile technique including full sterile barrier drape, hand hygiene, sterile gown, sterile gloves, mask, hair covering, sterile ultrasound probe cover (if used).  Procedure Description Area of catheter insertion was cleaned with chlorhexidine  and draped in sterile fashion.  With real-time ultrasound guidance a central venous catheter was placed into the left internal jugular vein. Nonpulsatile blood flow and easy flushing noted in all ports.  The catheter was sutured in place and sterile dressing applied.  Complications/Tolerance None; patient tolerated the procedure well. Chest X-ray is ordered to verify placement for internal jugular or subclavian cannulation.   Chest x-ray is not ordered for femoral cannulation.  EBL Minimal  Specimen(s) None

## 2023-12-12 NOTE — Progress Notes (Signed)
 eLink Physician-Brief Progress Note Patient Name: Anthony A Jewell Jr. DOB: [birth year] MRN: [id number]   Date of Service  12/12/2023  HPI/Events of Note  Received request for Flexiseal No contraindication reported nor identified  eICU Interventions  Fecal management system ordered     Intervention Category Minor Interventions: Other:  [name] 12/12/2023, 9:57 PM

## 2023-12-13 DIAGNOSIS — E162 Hypoglycemia, unspecified: Secondary | ICD-10-CM | POA: Diagnosis not present

## 2023-12-13 DIAGNOSIS — R6521 Severe sepsis with septic shock: Secondary | ICD-10-CM | POA: Diagnosis not present

## 2023-12-13 DIAGNOSIS — I509 Heart failure, unspecified: Secondary | ICD-10-CM | POA: Diagnosis not present

## 2023-12-13 DIAGNOSIS — I5020 Unspecified systolic (congestive) heart failure: Secondary | ICD-10-CM | POA: Diagnosis not present

## 2023-12-13 DIAGNOSIS — E871 Hypo-osmolality and hyponatremia: Secondary | ICD-10-CM | POA: Diagnosis not present

## 2023-12-13 DIAGNOSIS — A419 Sepsis, unspecified organism: Secondary | ICD-10-CM | POA: Diagnosis not present

## 2023-12-13 LAB — COMPREHENSIVE METABOLIC PANEL WITH GFR
ALT: 263 U/L — ABNORMAL HIGH (ref 0–44)
AST: 538 U/L — ABNORMAL HIGH (ref 15–41)
Albumin: 2.8 g/dL — ABNORMAL LOW (ref 3.5–5.0)
Alkaline Phosphatase: 82 U/L (ref 38–126)
Anion gap: 7 (ref 5–15)
BUN: 12 mg/dL (ref 8–23)
CO2: 28 mmol/L (ref 22–32)
Calcium: 6.9 mg/dL — ABNORMAL LOW (ref 8.9–10.3)
Chloride: 102 mmol/L (ref 98–111)
Creatinine, Ser: 0.76 mg/dL (ref 0.61–1.24)
GFR, Estimated: >60 mL/min (ref >60)
Glucose, Bld: 131 mg/dL — ABNORMAL HIGH (ref 70–99)
Potassium: 3.4 mmol/L — ABNORMAL LOW (ref 3.5–5.1)
Sodium: 137 mmol/L (ref 135–145)
Total Bilirubin: 1 mg/dL (ref 0.0–1.2)
Total Protein: 5.3 g/dL — ABNORMAL LOW (ref 6.5–8.1)

## 2023-12-13 LAB — BASIC METABOLIC PANEL WITH GFR
Anion gap: 16 — ABNORMAL HIGH (ref 5–15)
BUN: 15 mg/dL (ref 8–23)
CO2: 28 mmol/L (ref 22–32)
Calcium: 7.6 mg/dL — ABNORMAL LOW (ref 8.9–10.3)
Chloride: 99 mmol/L (ref 98–111)
Creatinine, Ser: 0.66 mg/dL (ref 0.61–1.24)
GFR, Estimated: >60 mL/min (ref >60)
Glucose, Bld: 123 mg/dL — ABNORMAL HIGH (ref 70–99)
Potassium: 3.3 mmol/L — ABNORMAL LOW (ref 3.5–5.1)
Sodium: 143 mmol/L (ref 135–145)

## 2023-12-13 LAB — CULTURE, BLOOD (ROUTINE X 2)

## 2023-12-13 LAB — CBC
HCT: 25.9 % — ABNORMAL LOW (ref 39.0–52.0)
Hemoglobin: 8.1 g/dL — ABNORMAL LOW (ref 13.0–17.0)
MCH: 29.1 pg (ref 26.0–34.0)
MCHC: 31.3 g/dL (ref 30.0–36.0)
MCV: 93.2 fL (ref 80.0–100.0)
Platelets: 175 10*3/uL (ref 150–400)
RBC: 2.78 MIL/uL — ABNORMAL LOW (ref 4.22–5.81)
RDW: 20.1 % — ABNORMAL HIGH (ref 11.5–15.5)
WBC: 7.6 10*3/uL (ref 4.0–10.5)
nRBC: 0 % (ref 0.0–0.2)

## 2023-12-13 LAB — GLUCOSE, CAPILLARY
Glucose-Capillary: 132 mg/dL — ABNORMAL HIGH (ref 70–99)
Glucose-Capillary: 119 mg/dL — ABNORMAL HIGH (ref 70–99)
Glucose-Capillary: 86 mg/dL (ref 70–99)
Glucose-Capillary: 85 mg/dL (ref 70–99)
Glucose-Capillary: 94 mg/dL (ref 70–99)

## 2023-12-13 LAB — PHOSPHORUS: Phosphorus: 3.1 mg/dL (ref 2.5–4.6)

## 2023-12-13 LAB — MAGNESIUM: Magnesium: 1.7 mg/dL (ref 1.7–2.4)

## 2023-12-13 MED ORDER — THIAMINE MONONITRATE 100 MG PO TABS
100.0000 mg | ORAL_TABLET | Freq: Every day | ORAL | Status: DC
  Administered 2023-12-14 – 2023-12-19 (×6): 100 mg
  Filled 2023-12-13 (×7): qty 1

## 2023-12-13 MED ORDER — AMIODARONE HCL IN DEXTROSE 360-4.14 MG/200ML-% IV SOLN
60.0000 mg/h | INTRAVENOUS | Status: DC
  Administered 2023-12-13: 60 mg/h via INTRAVENOUS
  Filled 2023-12-13 (×2): qty 200
  Filled 2023-12-13: qty 400

## 2023-12-13 MED ORDER — DEXMEDETOMIDINE HCL IN NACL 400 MCG/100ML IV SOLN
0.0000 ug/kg/h | INTRAVENOUS | Status: DC
  Administered 2023-12-13: 0.4 ug/kg/h via INTRAVENOUS
  Administered 2023-12-13: 0.6 ug/kg/h via INTRAVENOUS
  Filled 2023-12-13: qty 100
  Filled 2023-12-13: qty 200
  Filled 2023-12-13: qty 100

## 2023-12-13 MED ORDER — MAGNESIUM SULFATE 4 GM/100ML IV SOLN
4.0000 g | Freq: Once | INTRAVENOUS | Status: AC
  Administered 2023-12-13: 4 g via INTRAVENOUS
  Filled 2023-12-13: qty 100

## 2023-12-13 MED ORDER — QUETIAPINE FUMARATE 50 MG PO TABS: 50.0000 mg | ORAL_TABLET | Freq: Two times a day (BID) | ORAL | Status: DC

## 2023-12-13 MED ORDER — POTASSIUM CHLORIDE 20 MEQ PO PACK
40.0000 meq | PACK | Freq: Once | ORAL | Status: AC
  Administered 2023-12-13: 40 meq
  Filled 2023-12-13: qty 2

## 2023-12-13 MED ORDER — ALBUMIN HUMAN 25 % IV SOLN
25.0000 g | Freq: Four times a day (QID) | INTRAVENOUS | Status: AC
  Administered 2023-12-13: 25 g via INTRAVENOUS
  Administered 2023-12-13: 12.5 g via INTRAVENOUS
  Administered 2023-12-13 – 2023-12-14 (×2): 25 g via INTRAVENOUS
  Filled 2023-12-13 (×4): qty 100

## 2023-12-13 MED ORDER — CLONAZEPAM 1 MG PO TABS
2.0000 mg | ORAL_TABLET | Freq: Three times a day (TID) | ORAL | Status: DC
  Administered 2023-12-13 – 2023-12-14 (×4): 2 mg
  Filled 2023-12-13 (×4): qty 2

## 2023-12-13 MED ORDER — AMIODARONE LOAD VIA INFUSION
150.0000 mg | Freq: Once | INTRAVENOUS | Status: AC
  Administered 2023-12-13: 150 mg via INTRAVENOUS
  Filled 2023-12-13: qty 83.34

## 2023-12-13 MED ORDER — POTASSIUM CHLORIDE 10 MEQ/100ML IV SOLN
10.0000 meq | INTRAVENOUS | Status: AC
  Administered 2023-12-13 (×4): 10 meq via INTRAVENOUS
  Filled 2023-12-13 (×4): qty 100

## 2023-12-13 MED ORDER — AMIODARONE HCL IN DEXTROSE 360-4.14 MG/200ML-% IV SOLN
60.0000 mg/h | INTRAVENOUS | Status: DC
  Administered 2023-12-13: 30 mg/h via INTRAVENOUS
  Administered 2023-12-14 (×2): 60 mg/h via INTRAVENOUS
  Administered 2023-12-14: 30 mg/h via INTRAVENOUS
  Administered 2023-12-15 – 2023-12-19 (×13): 60 mg/h via INTRAVENOUS
  Filled 2023-12-13: qty 400
  Filled 2023-12-13 (×6): qty 200
  Filled 2023-12-13: qty 400
  Filled 2023-12-13: qty 200
  Filled 2023-12-13: qty 400
  Filled 2023-12-13 (×3): qty 200
  Filled 2023-12-13: qty 400
  Filled 2023-12-13 (×2): qty 200

## 2023-12-13 MED ORDER — OLANZAPINE 5 MG PO TABS
5.0000 mg | ORAL_TABLET | Freq: Two times a day (BID) | ORAL | Status: DC
  Administered 2023-12-13 – 2023-12-15 (×6): 5 mg
  Filled 2023-12-13 (×8): qty 1

## 2023-12-13 MED ORDER — CLONAZEPAM 1 MG PO TABS: 2.0000 mg | ORAL_TABLET | Freq: Three times a day (TID) | ORAL | Status: DC | PRN

## 2023-12-13 MED ORDER — THIAMINE HCL 100 MG/ML IJ SOLN
100.0000 mg | Freq: Every day | INTRAMUSCULAR | Status: DC
  Administered 2023-12-20: 100 mg via INTRAVENOUS
  Filled 2023-12-13 (×2): qty 2

## 2023-12-13 MED ORDER — FOLIC ACID 1 MG PO TABS
1.0000 mg | ORAL_TABLET | Freq: Every day | ORAL | Status: DC
  Administered 2023-12-14 – 2023-12-20 (×7): 1 mg
  Filled 2023-12-13 (×7): qty 1

## 2023-12-13 NOTE — TOC Initial Note (Signed)
 Transition of Care (TOC) - Initial/Assessment Note    Patient Details  Name: Anthony A Monteith Jr. MRN: [id number] Date of Birth: 14-Mar-1956  Transition of Care [facility]) CM/SW Contact:    Ernst Heap Phone Number: [phone] 12/13/2023, 12:20 PM  Clinical Narrative:   12:07 PM- HF CSW called and spoke with the patients brother. Patients brother stated that the patient is currently homeless. Patients brother stated to his knowledge his brother does not have a history with HH services. Patients brother stated that he does not have any equipment. Patient does not have a scale. CSW will find patient a PCP when he is closer to being medically ready for dc. Patients brother stated that he used to live in income based housing about 5 years ago but was evicted. Patients brother live sin [place], and stated that living with him is not an option but he helps his brother financially along with his sister.  CSW uploaded SA resources to AVS.   TOC will continue following.         Expected Discharge Plan: Home/Self Care Barriers to Discharge: Continued Medical Work up   Patient Goals and CMS Choice            Expected Discharge Plan and Services       Living arrangements for the past 2 months: Homeless                                      Prior Living Arrangements/Services Living arrangements for the past 2 months: Homeless Lives with:: Self Patient language and need for interpreter reviewed:: Yes        Need for Family Participation in Patient Care: Yes (Comment) Care giver support system in place?: No (comment)   Criminal Activity/Legal Involvement Pertinent to Current Situation/Hospitalization: No - Comment as needed  Activities of Daily Living      Permission Sought/Granted                  Emotional Assessment   Attitude/Demeanor/Rapport: Unable to Assess Affect (typically observed): Unable to Assess   Alcohol  / Substance Use: Alcohol  Use Psych  Involvement: No (comment)  Admission diagnosis:  Alcohol  withdrawal (HCC) [F10.939] Shortness of breath [R06.02] Lactic acidosis [E87.20] Acute delirium [R41.0] Alcohol  withdrawal syndrome without complication (HCC) [F10.930] Patient Active Problem List   Diagnosis Date Noted   Acute delirium 12/08/2023   Agitation 07/29/2023   History of CVA (cerebrovascular accident) 07/11/2023   Acute pulmonary embolism (HCC) 07/08/2023   Lactic acidosis 07/08/2023   Rib fractures 02/04/2023   Hypoglycemia 02/04/2023   Pulmonary embolus (HCC) 02/04/2023   Pulmonary embolism (HCC) 02/03/2023   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 difficile colitis 09/11/2022   Aspiration pneumonia (HCC) 08/27/2022   Aortic atherosclerosis (HCC) 08/27/2022   Depression, unspecified 08/09/2022   Hyponatremia 06/01/2022   Non compliance w medication regimen 05/31/2022   Delirium 05/25/2022   Stricture and stenosis of esophagus 05/21/2022   Gastritis and gastroduodenitis 05/21/2022   Loose stools 05/20/2022   Malnutrition of moderate degree 05/20/2022   Acute on chronic heart failure (HCC)    Leukocytosis 05/19/2022   Sepsis (HCC) 05/19/2022  Cellulitis of left leg, possible 05/19/2022    Class: Question of   Moniliasis, interdigital 05/19/2022   Open toe wound, right foot, medial fourth digit 05/19/2022   Malnutrition (HCC) 05/19/2022   Alcohol  abuse 03/24/2022   Dyslipidemia 03/24/2022   Acute on chronic diastolic CHF (congestive heart failure) (HCC) 03/24/2022   Acute respiratory failure with hypoxia (HCC) 03/23/2022   Generalized weakness    Atrial fibrillation with rapid ventricular response (HCC)  03/15/2022   Hypokalemia 03/15/2022   Decreased functional mobility and endurance 06/22/2021   Iron  deficiency anemia 10/16/2020   Cough    Hypomagnesemia    Protein-calorie malnutrition, severe 08/22/2020   Alcohol  use disorder, severe, in controlled environment (HCC)    Pressure injury of skin 08/18/2020   Urinary tract infection without hematuria    Severe sepsis (HCC) 08/14/2020   Paroxysmal atrial fibrillation (HCC) 08/13/2020   Rhabdomyolysis 07/22/2020   Cerebral ventriculomegaly 07/22/2020   Hemoglobin decreased 07/22/2020   History of delirium tremons 07/22/2020   Hypoalbuminemia due to protein-calorie malnutrition (HCC) 07/22/2020   Tobacco use disorder 07/22/2020   Alcohol  withdrawal delirium (HCC)    Dehydration    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) 07/18/2018   Depression 05/26/2018   Anxiety state 04/25/2018   Need for immunization against influenza 04/25/2018   Alcohol  withdrawal (HCC) 09/01/2017   DOE (dyspnea on exertion) 11/27/2016   Actinic keratosis 11/27/2016   Macrocytic anemia 11/23/2016   History of pulmonary embolus (PE) 11/02/2016   Hiatal hernia 09/14/2016   Skin cancer 08/13/2016   Poor social situation 06/09/2013   Tinea versicolor 06/08/2013   Back pain 05/07/2013   Seborrheic dermatitis of scalp 11/08/2012   HTN (hypertension) 04/11/2012   Alcohol  use disorder, severe, dependence (HCC) 09/16/2006   PCP:  Pcp, No Pharmacy:   Melodee Spruce [facility] - [facility] [address] [place] [address] Phone: [phone] Fax: [phone]  [id number] - [place], [place] - [address] AT [facility] [address] [place] [address] Phone: [phone] Fax: [phone]     Social Drivers of Health (SDOH) Social History: SDOH Screenings   Food Insecurity: Patient Unable To Answer (12/09/2023)  Housing: Patient Unable To Answer (12/09/2023)   Transportation Needs: Patient Unable To Answer (12/09/2023)  Utilities: Patient Unable To Answer (12/09/2023)  Depression (PHQ2-9): Medium Risk (10/16/2020)  Social Connections: Patient Unable To Answer (12/09/2023)  Tobacco Use: High Risk (12/08/2023)   SDOH Interventions:     Readmission Risk Interventions    08/27/2023   12:21 PM 08/19/2023    9:17 AM 07/12/2023    9:58 AM  Readmission Risk Prevention Plan  Transportation Screening Complete Complete Complete  Medication Review Oceanographer) Complete Complete Complete  PCP or Specialist appointment within 3-5 days of discharge Complete  --  HRI or Home Care Consult Complete Complete Complete  SW Recovery Care/Counseling Consult Complete Complete Complete  Palliative Care Screening Not Applicable Not Applicable Not Applicable  Skilled Nursing Facility Not Applicable Not Applicable Not Applicable

## 2023-12-13 NOTE — Progress Notes (Signed)
 Advanced Heart Failure Rounding Note  Cardiologist: Antoinette Batman, MD  Chief Complaint: RV Failure  Subjective:   Agitated overnight. Tachycardic.   Sedated on precedex .    Objective:   Weight Range: 74.3 kg Body mass index is 24.91 kg/m.   Vital Signs:   Temp:  [97 F (36.1 C)-97.9 F (36.6 C)] 97 F (36.1 C) (05/26 0700) Pulse Rate:  [69-188] 81 (05/26 0700) Resp:  [0-32] 13 (05/26 0700) BP: (82-119)/(56-97) 95/71 (05/26 0700) SpO2:  [83 %-100 %] 100 % (05/26 0700) Last BM Date : 12/12/23  Weight change: Filed Weights   12/07/23 1804 12/09/23 0000 12/11/23 0500  Weight: 65 kg 70.4 kg 74.3 kg    Intake/Output:   Intake/Output Summary (Last 24 hours) at 12/13/2023 0857 Last data filed at 12/13/2023 0700 Gross per 24 hour  Intake 2736.13 ml  Output 6190 ml  Net -3453.87 ml     CVP 8-9  Physical Exam  General:    +Cortrak No resp difficulty Neck: supple. no JVD.  Cor: PMI nondisplaced. Regular rate & rhythm. No rubs, gallops or murmurs. Lungs: clear Abdomen: soft, nontender, nondistended.  Extremities: no cyanosis, clubbing, rash, edema Neuro: drowsy.    Telemetry   SR  EKG    NA Labs    CBC Recent Labs    12/12/23 1049 12/13/23 0547  WBC 6.0 7.6  HGB 8.2* 8.1*  HCT 26.0* 25.9*  MCV 93.5 93.2  PLT 179 175   Basic Metabolic Panel Recent Labs    24/40/10 0647 12/12/23 1049 12/12/23 2127 12/13/23 0547  NA  --  134* 136 137  K  --  3.5 3.1* 3.4*  CL  --  103 98 102  CO2  --  23 27 28   GLUCOSE  --  203* 99 131*  BUN  --  9 12 12   CREATININE  --  0.59* 0.64 0.76  CALCIUM   --  7.1* 7.5* 6.9*  MG 1.6* 1.6*  --  1.7  PHOS 1.9*  --   --  3.1   Liver Function Tests Recent Labs    12/12/23 1049 12/13/23 0547  AST 661* 538*  ALT 280* 263*  ALKPHOS 88 82  BILITOT 0.8 1.0  PROT 5.7* 5.3*  ALBUMIN  3.1* 2.8*   No results for input(s): "LIPASE", "AMYLASE" in the last 72 hours. Cardiac Enzymes Recent Labs     12/10/23 1735 12/12/23 0647  CKTOTAL 2,206* 1,457*    BNP: BNP (last 3 results) Recent Labs    12/06/23 0034 12/06/23 2116 12/11/23 0918  BNP 587.5* 377.5* 794.2*    ProBNP (last 3 results) No results for input(s): "PROBNP" in the last 8760 hours.   D-Dimer No results for input(s): "DDIMER" in the last 72 hours. Hemoglobin A1C No results for input(s): "HGBA1C" in the last 72 hours. Fasting Lipid Panel No results for input(s): "CHOL", "HDL", "LDLCALC", "TRIG", "CHOLHDL", "LDLDIRECT" in the last 72 hours. Thyroid Function Tests No results for input(s): "TSH", "T4TOTAL", "T3FREE", "THYROIDAB" in the last 72 hours.  Invalid input(s): "FREET3"  Other results:   Imaging    DG Chest Port 1 View Result Date: 12/12/2023 CLINICAL DATA:  CVC placement, SOB. EXAM: PORTABLE CHEST 1 VIEW COMPARISON:  12/11/2023. FINDINGS: Enlarged cardiac silhouette. Large layering pleural effusion on the right. Smaller pleural effusion on the left. Bibasilar atelectasis or consolidation. No pneumothorax. Feeding tube tip extends below the diaphragm and off the x-ray. Left-sided CVC tip overlies distal SVC. IMPRESSION: 1. Bilateral pleural effusions, right  greater than left. Bibasilar atelectasis or consolidation. 2. CVC tip overlies distal SVC. Electronically Signed   By: Sydell Eva M.D.   On: 12/12/2023 09:41     Medications:     Scheduled Medications:  arformoterol  15 mcg Nebulization BID   Chlorhexidine  Gluconate Cloth  6 each Topical Daily   enoxaparin  (LOVENOX ) injection  70 mg Subcutaneous Q12H   feeding supplement (PROSource TF20)  60 mL Per Tube Daily   folic acid   1 mg Oral Daily   furosemide   60 mg Intravenous BID   LORazepam   0-4 mg Intravenous Q4H   Followed by   LORazepam   0-4 mg Intravenous Q8H   multivitamin with minerals  1 tablet Per Tube Daily   OLANZapine   5 mg Per Tube BID   mouth rinse  15 mL Mouth Rinse 4 times per day   pantoprazole   40 mg Oral Daily    revefenacin  175 mcg Nebulization Daily   sodium chloride  flush  10-40 mL Intracatheter Q12H   thiamine   100 mg Oral Daily   Or   thiamine   100 mg Intravenous Daily    Infusions:  albumin  human     cefTRIAXone  (ROCEPHIN )  IV Stopped (12/12/23 1102)   dexmedetomidine  (PRECEDEX ) IV infusion 0.6 mcg/kg/hr (12/13/23 0837)   feeding supplement (VITAL 1.5 CAL) 40 mL/hr at 12/13/23 0700   metronidazole  Stopped (12/12/23 2332)   norepinephrine  (LEVOPHED ) Adult infusion Stopped (12/11/23 1217)    PRN Medications: acetaminophen , clonazePAM, haloperidol  lactate, LORazepam  **OR** LORazepam , mouth rinse, mouth rinse, sodium chloride  flush    Patient Profile    68 y.o. with history of ETOH abuse and withdrawal, homelessness, HFmrEF, paroxysmal atrial fibrillation, CAD, aspiration PNA, PTX and PE was admitted with metabolic derangement/ETOH withdrawal.  HF Team consulted for CHF/RV failure    Assessment/Plan   1. ETOH withdrawal: Long h/o ETOH abuse.  Now with ETOH withdrawal and acute delirium possibly superimposed on baseline dementia. On precedex .  - Per CCM.  2. Acute on chronic HF with mid range EF with prominent RV dysfunction: Low LV EF may be related to ETOH abuse. Echo this admission with mildly lower EF than the past, EF 35-40%, moderate RV enlargement and moderate RV dysfunction.  Significant RV dysfunction, he has history of PEs and has not been compliant with anticoagulation.  CVP 8-9. Give once dose of IV lasix  then lasix  40 mg po daily.  - Would recommend V/Q scan in the future to assess for chronic PEs if he follows up in the clinic.  3. H/o PEs: no DVT on US  this admission.  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 Do not think he has been taking his meds at home.  - Eventual V/Q scan. If he follows up in clinic.  - Continue Lovenox .  4. ID: Suspect component of septic shock.  GNR bacteremia, source uncertain (?pulmonary).  Additionally has non-COVID coronavirus infection. MAP  stable.  - Continue ceftriaxone /Flagyl . 5. Pulmonary: CXR with pleural effusions, now on 4L [place]. Possible component of COPD exacerbation.  -As above give another dose of IV lasix .   - Nebs per pulmonary.  6. 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  Liver did not appear cirrhotic on imaging.  7. Atrial fibrillation: Persistent.  Has not been compliant with anticoagulation at home.  Rate control is reasonable.  - No plan for DCCV.  - Continue Lovenox .  8. Homeless   Heart failure team will sign off as of 12/13/23 Not a  candidate for advanced therapies. Additional work up if he follows up in the clinic.  HF Team Medication Recommendations for Home: Lasix  40 mg po daily    Follow up as an outpatient: We will set up     Length of Stay: 5  Karisha Marlin, NP  12/13/2023, 8:57 AM  Advanced Heart Failure Team Pager [phone] (M-F; 7a - 5p)  Please contact [facility] for night-coverage after hours (5p -7a ) and weekends on [email or web address]
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
## 2023-12-13 NOTE — Progress Notes (Signed)
 eLink Physician-Brief Progress Note Patient Name: Anthony [name]. DOB: 1955-09-24 MRN: [id number]   Date of Service  12/13/2023  HPI/Events of Note  Transient control of agitation with dilaudid HR remains 150s afib K 3.1  eICU Interventions  Precedex  resumed K 40 meqs ordered Mg and phos level added on     Intervention Category Intermediate Interventions: Other:  [name] 12/13/2023, 12:54 AM

## 2023-12-14 ENCOUNTER — Other Ambulatory Visit (HOSPITAL_COMMUNITY): Payer: Self-pay

## 2023-12-14 ENCOUNTER — Telehealth (HOSPITAL_COMMUNITY): Payer: Self-pay | Admitting: Pharmacy Technician

## 2023-12-14 DIAGNOSIS — R6521 Severe sepsis with septic shock: Secondary | ICD-10-CM | POA: Diagnosis not present

## 2023-12-14 DIAGNOSIS — E871 Hypo-osmolality and hyponatremia: Secondary | ICD-10-CM | POA: Diagnosis not present

## 2023-12-14 DIAGNOSIS — I5020 Unspecified systolic (congestive) heart failure: Secondary | ICD-10-CM | POA: Diagnosis not present

## 2023-12-14 DIAGNOSIS — A419 Sepsis, unspecified organism: Secondary | ICD-10-CM | POA: Diagnosis not present

## 2023-12-14 DIAGNOSIS — E162 Hypoglycemia, unspecified: Secondary | ICD-10-CM | POA: Diagnosis not present

## 2023-12-14 DIAGNOSIS — I509 Heart failure, unspecified: Secondary | ICD-10-CM | POA: Diagnosis not present

## 2023-12-14 LAB — CULTURE, BLOOD (ROUTINE X 2)
Culture: NO GROWTH
Special Requests: ADEQUATE

## 2023-12-14 LAB — GLUCOSE, CAPILLARY
Glucose-Capillary: 103 mg/dL — ABNORMAL HIGH (ref 70–99)
Glucose-Capillary: 105 mg/dL — ABNORMAL HIGH (ref 70–99)
Glucose-Capillary: 77 mg/dL (ref 70–99)
Glucose-Capillary: 82 mg/dL (ref 70–99)
Glucose-Capillary: 83 mg/dL (ref 70–99)
Glucose-Capillary: 98 mg/dL (ref 70–99)

## 2023-12-14 LAB — COMPREHENSIVE METABOLIC PANEL WITH GFR
ALT: 211 U/L — ABNORMAL HIGH (ref 0–44)
AST: 386 U/L — ABNORMAL HIGH (ref 15–41)
Albumin: 3.6 g/dL (ref 3.5–5.0)
Alkaline Phosphatase: 74 U/L (ref 38–126)
Anion gap: 5 (ref 5–15)
BUN: 15 mg/dL (ref 8–23)
CO2: 31 mmol/L (ref 22–32)
Calcium: 7.7 mg/dL — ABNORMAL LOW (ref 8.9–10.3)
Chloride: 104 mmol/L (ref 98–111)
Creatinine, Ser: 0.69 mg/dL (ref 0.61–1.24)
GFR, Estimated: >60 mL/min (ref >60)
Glucose, Bld: 101 mg/dL — ABNORMAL HIGH (ref 70–99)
Potassium: 3.4 mmol/L — ABNORMAL LOW (ref 3.5–5.1)
Sodium: 140 mmol/L (ref 135–145)
Total Bilirubin: 1 mg/dL (ref 0.0–1.2)
Total Protein: 5.7 g/dL — ABNORMAL LOW (ref 6.5–8.1)

## 2023-12-14 LAB — BASIC METABOLIC PANEL WITH GFR
Anion gap: 13 (ref 5–15)
BUN: 18 mg/dL (ref 8–23)
CO2: 31 mmol/L (ref 22–32)
Calcium: 8.7 mg/dL — ABNORMAL LOW (ref 8.9–10.3)
Chloride: 97 mmol/L — ABNORMAL LOW (ref 98–111)
Creatinine, Ser: 0.69 mg/dL (ref 0.61–1.24)
GFR, Estimated: >60 mL/min (ref >60)
Glucose, Bld: 105 mg/dL — ABNORMAL HIGH (ref 70–99)
Potassium: 3.7 mmol/L (ref 3.5–5.1)
Sodium: 141 mmol/L (ref 135–145)

## 2023-12-14 LAB — PHOSPHORUS: Phosphorus: 2.6 mg/dL (ref 2.5–4.6)

## 2023-12-14 LAB — CBC
HCT: 23.7 % — ABNORMAL LOW (ref 39.0–52.0)
Hemoglobin: 7.5 g/dL — ABNORMAL LOW (ref 13.0–17.0)
MCH: 30 pg (ref 26.0–34.0)
MCHC: 31.6 g/dL (ref 30.0–36.0)
MCV: 94.8 fL (ref 80.0–100.0)
Platelets: 138 10*3/uL — ABNORMAL LOW (ref 150–400)
RBC: 2.5 MIL/uL — ABNORMAL LOW (ref 4.22–5.81)
RDW: 20.5 % — ABNORMAL HIGH (ref 11.5–15.5)
WBC: 3.4 10*3/uL — ABNORMAL LOW (ref 4.0–10.5)
nRBC: 0 % (ref 0.0–0.2)

## 2023-12-14 LAB — MAGNESIUM: Magnesium: 2.1 mg/dL (ref 1.7–2.4)

## 2023-12-14 MED ORDER — POTASSIUM CHLORIDE 20 MEQ PO PACK
40.0000 meq | PACK | Freq: Once | ORAL | Status: AC
  Administered 2023-12-14: 40 meq
  Filled 2023-12-14: qty 2

## 2023-12-14 MED ORDER — FUROSEMIDE 10 MG/ML IJ SOLN
20.0000 mg | Freq: Once | INTRAMUSCULAR | Status: AC
  Administered 2023-12-14: 20 mg via INTRAVENOUS
  Filled 2023-12-14: qty 2

## 2023-12-14 MED ORDER — AMIODARONE LOAD VIA INFUSION
150.0000 mg | Freq: Once | INTRAVENOUS | Status: AC
  Administered 2023-12-14: 150 mg via INTRAVENOUS

## 2023-12-14 MED ORDER — CLONAZEPAM 1 MG PO TABS
1.0000 mg | ORAL_TABLET | Freq: Three times a day (TID) | ORAL | Status: DC
  Administered 2023-12-14: 1 mg
  Filled 2023-12-14: qty 1

## 2023-12-14 MED ORDER — DIGOXIN 0.25 MG/ML IJ SOLN
0.2500 mg | Freq: Once | INTRAMUSCULAR | Status: AC
Start: 2023-12-14 — End: 2023-12-14
  Administered 2023-12-14: 0.25 mg via INTRAVENOUS
  Filled 2023-12-14: qty 2

## 2023-12-14 MED ORDER — MAGNESIUM SULFATE 2 GM/50ML IV SOLN
2.0000 g | Freq: Once | INTRAVENOUS | Status: AC
  Administered 2023-12-14: 2 g via INTRAVENOUS
  Filled 2023-12-14: qty 50

## 2023-12-14 MED ORDER — FUROSEMIDE 10 MG/ML IJ SOLN
80.0000 mg | Freq: Two times a day (BID) | INTRAMUSCULAR | Status: DC
  Administered 2023-12-14: 80 mg via INTRAVENOUS
  Filled 2023-12-14: qty 8

## 2023-12-14 MED ORDER — POTASSIUM CHLORIDE 20 MEQ PO PACK: 40.0000 meq | PACK | Freq: Once | ORAL | Status: DC

## 2023-12-14 NOTE — Telephone Encounter (Signed)
 Pharmacy Patient Advocate Encounter  Insurance verification completed.    The patient is insured through [facility]. Patient has Medicare and is not eligible for a copay card, but may be able to apply for patient assistance or Medicare RX Payment Plan (Patient Must reach out to their plan, if eligible for payment plan), if available.    Ran test claim for eliquis  5mg  tablets and the current 30 day co-pay is $0.00.  Ran test claim for xarelto  10mg  tablets and the current 30 day co-pay is $0.00.   This test claim was processed through  [facility]- copay amounts may vary at other pharmacies due to pharmacy/plan contracts, or as the patient moves through the different stages of their insurance plan.

## 2023-12-14 NOTE — Progress Notes (Signed)
 Advanced Heart Failure Rounding Note  Cardiologist: Antoinette Batman, MD  Chief Complaint: RV Failure  Subjective:    Back in  Afib RVR this morning.   On precedex  over night. Drowsy.   Objective:   Weight Range: 74.3 kg Body mass index is 24.91 kg/m.   Vital Signs:   Temp:  [97 F (36.1 C)-98.6 F (37 C)] 97 F (36.1 C) (05/27 0700) Pulse Rate:  [67-134] 112 (05/27 0800) Resp:  [11-18] 15 (05/27 0800) BP: (81-121)/(58-99) 121/99 (05/27 0800) SpO2:  [98 %-100 %] 98 % (05/27 0800) Last BM Date : 12/14/23  Weight change: Filed Weights   12/07/23 1804 12/09/23 0000 12/11/23 0500  Weight: 65 kg 70.4 kg 74.3 kg    Intake/Output:   Intake/Output Summary (Last 24 hours) at 12/14/2023 1100 Last data filed at 12/14/2023 0900 Gross per 24 hour  Intake 2323.59 ml  Output 4280 ml  Net -1956.41 ml     CVP 12-13  Physical Exam  General:  Drowsy . Appears weak.  Neck: supple. JVP 11-12  Cor: PMI nondisplaced. Tach Irregular rate & rhythm. No rubs, gallops or murmurs. Lungs: Crackles in the bases on 4 liters  Abdomen: soft, nontender, nondistended.  Extremities: no cyanosis, clubbing, rash, edema Neuro: Drowsy   Telemetry    A fib RVR 130-140s.   EKG    NA Labs    CBC Recent Labs    12/13/23 0547 12/14/23 0500  WBC 7.6 3.4*  HGB 8.1* 7.5*  HCT 25.9* 23.7*  MCV 93.2 94.8  PLT 175 138*   Basic Metabolic Panel Recent Labs    32/35/57 0547 12/13/23 1400 12/14/23 0500  NA 137 143 140  K 3.4* 3.3* 3.4*  CL 102 99 104  CO2 28 28 31   GLUCOSE 131* 123* 101*  BUN 12 15 15   CREATININE 0.76 0.66 0.69  CALCIUM  6.9* 7.6* 7.7*  MG 1.7  --  2.1  PHOS 3.1  --  2.6   Liver Function Tests Recent Labs    12/13/23 0547 12/14/23 0500  AST 538* 386*  ALT 263* 211*  ALKPHOS 82 74  BILITOT 1.0 1.0  PROT 5.3* 5.7*  ALBUMIN  2.8* 3.6   No results for input(s): "LIPASE", "AMYLASE" in the last 72 hours. Cardiac Enzymes Recent Labs    12/12/23 0647   CKTOTAL 1,457*    BNP: BNP (last 3 results) Recent Labs    12/06/23 0034 12/06/23 2116 12/11/23 0918  BNP 587.5* 377.5* 794.2*    ProBNP (last 3 results) No results for input(s): "PROBNP" in the last 8760 hours.   D-Dimer No results for input(s): "DDIMER" in the last 72 hours. Hemoglobin A1C No results for input(s): "HGBA1C" in the last 72 hours. Fasting Lipid Panel No results for input(s): "CHOL", "HDL", "LDLCALC", "TRIG", "CHOLHDL", "LDLDIRECT" in the last 72 hours. Thyroid Function Tests No results for input(s): "TSH", "T4TOTAL", "T3FREE", "THYROIDAB" in the last 72 hours.  Invalid input(s): "FREET3"  Other results:   Imaging    No results found.    Medications:     Scheduled Medications:  amiodarone   150 mg Intravenous Once   arformoterol  15 mcg Nebulization BID   Chlorhexidine  Gluconate Cloth  6 each Topical Daily   clonazePAM  1 mg Per Tube TID   enoxaparin  (LOVENOX ) injection  70 mg Subcutaneous Q12H   feeding supplement (PROSource TF20)  60 mL Per Tube Daily   folic acid   1 mg Per Tube Daily   furosemide   20  mg Intravenous Once   furosemide   60 mg Intravenous BID   LORazepam   0-4 mg Intravenous Q8H   multivitamin with minerals  1 tablet Per Tube Daily   OLANZapine   5 mg Per Tube BID   mouth rinse  15 mL Mouth Rinse 4 times per day   pantoprazole   40 mg Oral Daily   revefenacin  175 mcg Nebulization Daily   sodium chloride  flush  10-40 mL Intracatheter Q12H   thiamine   100 mg Per Tube Daily   Or   thiamine   100 mg Intravenous Daily    Infusions:  amiodarone  30 mg/hr (12/14/23 1040)   dexmedetomidine  (PRECEDEX ) IV infusion Stopped (12/14/23 0649)   feeding supplement (VITAL 1.5 CAL) 55 mL/hr at 12/14/23 0800   magnesium  sulfate bolus IVPB     norepinephrine  (LEVOPHED ) Adult infusion Stopped (12/11/23 1217)    PRN Medications: acetaminophen , haloperidol  lactate, mouth rinse, mouth rinse, sodium chloride  flush    Patient Profile     68 y.o. with history of ETOH abuse and withdrawal, homelessness, HFmrEF, paroxysmal atrial fibrillation, CAD, aspiration PNA, PTX and PE was admitted with metabolic derangement/ETOH withdrawal.  HF Team consulted for CHF/RV failure    Assessment/Plan   1. ETOH withdrawal: Long h/o ETOH abuse.  Now with ETOH withdrawal and acute delirium possibly superimposed on baseline dementia.  CIWA. Holding precedex .  - Per CCM.  2. Acute on chronic HF with mid range EF with prominent RV dysfunction: Low LV EF may be related to ETOH abuse. Echo this admission with mildly lower EF than the past, EF 35-40%, moderate RV enlargement and moderate RV dysfunction.  Significant RV dysfunction, he has history of PEs and has not been compliant with anticoagulation.  CVP 11-12 today. Increase lasix  80 mg twice a day . Supp K  - Would recommend V/Q scan in the future to assess for chronic PEs if he follows up in the clinic.  3. H/o PEs: no DVT on US  this admission.  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 Do not think he has been taking his meds at home.  - Eventual V/Q scan. If he follows up in clinic.  - Continue Lovenox .  4. ID: Suspect component of septic shock.  GNR bacteremia, source uncertain (?pulmonary).  Additionally has non-COVID coronavirus infection.  - Completed antibiotics. Possible component of COPD exacerbation.  -As above give another dose of IV lasix .   - Nebs per pulmonary.  6. 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  Liver did not appear cirrhotic on imaging.  7. Atrial fibrillation: Persistent.  Has not been compliant with anticoagulation at home.   - Today rate uncontrolled A fib RVR. Give 150 mg amio bolus. Increase amio to 60 mg per hour.  - No plan for DCCV.  - Continue Lovenox .  8. Homeless   Length of Stay: 6  Nieves Bars, NP  12/14/2023, 11:00 AM  Advanced Heart Failure Team Pager [phone] (M-F; 7a - 5p)  Please contact [facility] for  night-coverage after hours (5p -7a ) and weekends on [email or web address]

## 2023-12-14 NOTE — Progress Notes (Signed)
 [facility] ADULT ICU REPLACEMENT PROTOCOL   The patient does apply for the [facility] Adult ICU Electrolyte Replacment Protocol based on the criteria listed below:   1.Exclusion criteria: TCTS, ECMO, Dialysis, and Myasthenia Gravis patients 2. Is GFR >/= 30 ml/min? Yes.    Patient's GFR today is >60 3. Is SCr </= 2? Yes.   Patient's SCr is 0.69 mg/dL 4. Did SCr increase >/= 0.5 in 24 hours? No. 5.Pt's weight >40kg  Yes.   6. Abnormal electrolyte(s): K+ 3.4  7. Electrolytes replaced per protocol 8.  Call MD STAT for K+ </= 2.5, Phos </= 1, or Mag </= 1 Physician:  Dr. Lottie Roulette, Doloris Freund 12/14/2023 6:21 AM
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
## 2023-12-15 ENCOUNTER — Other Ambulatory Visit (HOSPITAL_COMMUNITY): Payer: Self-pay

## 2023-12-15 DIAGNOSIS — E162 Hypoglycemia, unspecified: Secondary | ICD-10-CM | POA: Diagnosis not present

## 2023-12-15 DIAGNOSIS — I509 Heart failure, unspecified: Secondary | ICD-10-CM | POA: Diagnosis not present

## 2023-12-15 DIAGNOSIS — E871 Hypo-osmolality and hyponatremia: Secondary | ICD-10-CM | POA: Diagnosis not present

## 2023-12-15 DIAGNOSIS — A419 Sepsis, unspecified organism: Secondary | ICD-10-CM | POA: Diagnosis not present

## 2023-12-15 DIAGNOSIS — R6521 Severe sepsis with septic shock: Secondary | ICD-10-CM | POA: Diagnosis not present

## 2023-12-15 DIAGNOSIS — I5020 Unspecified systolic (congestive) heart failure: Secondary | ICD-10-CM | POA: Diagnosis not present

## 2023-12-15 LAB — COMPREHENSIVE METABOLIC PANEL WITH GFR
ALT: 223 U/L — ABNORMAL HIGH (ref 0–44)
AST: 397 U/L — ABNORMAL HIGH (ref 15–41)
Albumin: 3.7 g/dL (ref 3.5–5.0)
Alkaline Phosphatase: 84 U/L (ref 38–126)
Anion gap: 8 (ref 5–15)
BUN: 20 mg/dL (ref 8–23)
CO2: 32 mmol/L (ref 22–32)
Calcium: 8.3 mg/dL — ABNORMAL LOW (ref 8.9–10.3)
Chloride: 99 mmol/L (ref 98–111)
Creatinine, Ser: 0.77 mg/dL (ref 0.61–1.24)
GFR, Estimated: >60 mL/min (ref >60)
Glucose, Bld: 98 mg/dL (ref 70–99)
Potassium: 3.8 mmol/L (ref 3.5–5.1)
Sodium: 139 mmol/L (ref 135–145)
Total Bilirubin: 1 mg/dL (ref 0.0–1.2)
Total Protein: 6.3 g/dL — ABNORMAL LOW (ref 6.5–8.1)

## 2023-12-15 LAB — GLUCOSE, CAPILLARY
Glucose-Capillary: 102 mg/dL — ABNORMAL HIGH (ref 70–99)
Glucose-Capillary: 104 mg/dL — ABNORMAL HIGH (ref 70–99)
Glucose-Capillary: 112 mg/dL — ABNORMAL HIGH (ref 70–99)
Glucose-Capillary: 117 mg/dL — ABNORMAL HIGH (ref 70–99)
Glucose-Capillary: 125 mg/dL — ABNORMAL HIGH (ref 70–99)
Glucose-Capillary: 86 mg/dL (ref 70–99)

## 2023-12-15 LAB — MAGNESIUM: Magnesium: 1.7 mg/dL (ref 1.7–2.4)

## 2023-12-15 LAB — CBC
HCT: 25.8 % — ABNORMAL LOW (ref 39.0–52.0)
Hemoglobin: 8.1 g/dL — ABNORMAL LOW (ref 13.0–17.0)
MCH: 28.8 pg (ref 26.0–34.0)
MCHC: 31.4 g/dL (ref 30.0–36.0)
MCV: 91.8 fL (ref 80.0–100.0)
Platelets: 172 10*3/uL (ref 150–400)
RBC: 2.81 MIL/uL — ABNORMAL LOW (ref 4.22–5.81)
RDW: 20.8 % — ABNORMAL HIGH (ref 11.5–15.5)
WBC: 6.2 10*3/uL (ref 4.0–10.5)
nRBC: 0 % (ref 0.0–0.2)

## 2023-12-15 LAB — PHOSPHORUS: Phosphorus: 3.6 mg/dL (ref 2.5–4.6)

## 2023-12-15 LAB — AMMONIA: Ammonia: 22 umol/L (ref 9–35)

## 2023-12-15 MED ORDER — SPIRONOLACTONE 12.5 MG HALF TABLET
12.5000 mg | ORAL_TABLET | Freq: Every day | ORAL | Status: DC
  Administered 2023-12-15 – 2023-12-24 (×10): 12.5 mg via ORAL
  Filled 2023-12-15 (×10): qty 1

## 2023-12-15 MED ORDER — AMIODARONE LOAD VIA INFUSION
150.0000 mg | Freq: Once | INTRAVENOUS | Status: AC
  Administered 2023-12-15: 150 mg via INTRAVENOUS
  Filled 2023-12-15: qty 83.34

## 2023-12-15 MED ORDER — LACTULOSE 10 GM/15ML PO SOLN
10.0000 g | Freq: Three times a day (TID) | ORAL | Status: DC
  Administered 2023-12-15 – 2023-12-16 (×2): 10 g
  Filled 2023-12-15 (×2): qty 15

## 2023-12-15 MED ORDER — SACUBITRIL-VALSARTAN 49-51 MG PO TABS
1.0000 | ORAL_TABLET | Freq: Two times a day (BID) | ORAL | Status: DC
  Administered 2023-12-15 (×2): 1
  Filled 2023-12-15 (×3): qty 1

## 2023-12-15 MED ORDER — POTASSIUM CHLORIDE 20 MEQ PO PACK
20.0000 meq | PACK | Freq: Once | ORAL | Status: AC
  Administered 2023-12-15: 20 meq
  Filled 2023-12-15: qty 1

## 2023-12-15 MED ORDER — CLONAZEPAM 0.5 MG PO TABS
0.5000 mg | ORAL_TABLET | Freq: Three times a day (TID) | ORAL | Status: DC
  Administered 2023-12-15 (×3): 0.5 mg
  Filled 2023-12-15 (×3): qty 1

## 2023-12-15 MED ORDER — FUROSEMIDE 40 MG PO TABS
40.0000 mg | ORAL_TABLET | Freq: Every day | ORAL | Status: DC
  Administered 2023-12-15 – 2023-12-16 (×2): 40 mg via ORAL
  Filled 2023-12-15 (×2): qty 1

## 2023-12-15 MED ORDER — MAGNESIUM SULFATE 2 GM/50ML IV SOLN
2.0000 g | Freq: Once | INTRAVENOUS | Status: AC
  Administered 2023-12-15: 2 g via INTRAVENOUS
  Filled 2023-12-15: qty 50

## 2023-12-15 MED ORDER — GERHARDT'S BUTT CREAM
TOPICAL_CREAM | Freq: Two times a day (BID) | CUTANEOUS | Status: DC
  Administered 2023-12-22 – 2024-01-04 (×3): 1 via TOPICAL
  Filled 2023-12-15 (×5): qty 60

## 2023-12-15 MED ORDER — DIGOXIN 0.25 MG/ML IJ SOLN
0.2500 mg | Freq: Once | INTRAMUSCULAR | Status: AC
  Administered 2023-12-15: 0.5 mg via INTRAVENOUS
  Filled 2023-12-15: qty 2

## 2023-12-15 NOTE — Progress Notes (Signed)
 Advanced Heart Failure Rounding Note  Cardiologist: Antoinette Batman, MD  Chief Complaint: RV Failure  Subjective:   Remains in A fib RVR on amio drip.   Remains drowsy   Objective:   Weight Range: 74.3 kg Body mass index is 24.91 kg/m.   Vital Signs:   Temp:  [97.5 F (36.4 C)-98.9 F (37.2 C)] 98.5 F (36.9 C) (05/28 0400) Pulse Rate:  [112-157] 133 (05/28 0700) Resp:  [15-28] 22 (05/28 0700) BP: (109-156)/(75-107) 156/107 (05/28 0700) SpO2:  [92 %-100 %] 100 % (05/28 0700) Last BM Date : 12/14/23  Weight change: Filed Weights   12/07/23 1804 12/09/23 0000 12/11/23 0500  Weight: 65 kg 70.4 kg 74.3 kg    Intake/Output:   Intake/Output Summary (Last 24 hours) at 12/15/2023 0758 Last data filed at 12/15/2023 0700 Gross per 24 hour  Intake 2255.48 ml  Output 8915 ml  Net -6659.52 ml     CVP 4  Physical Exam  General:   No resp difficulty Neck: supple. no JVD.  Cor: PMI nondisplaced. Tachy Irregular rate & rhythm. No rubs, gallops or murmurs. Lungs: clear Abdomen: soft, nontender, nondistended.  Extremities: no cyanosis, clubbing, rash, edema Neuro: alert & oriented x3   Telemetry    A fib 130s   EKG    NA Labs    CBC Recent Labs    12/14/23 0500 12/15/23 0533  WBC 3.4* 6.2  HGB 7.5* 8.1*  HCT 23.7* 25.8*  MCV 94.8 91.8  PLT 138* 172   Basic Metabolic Panel Recent Labs    54/09/81 0500 12/14/23 1219 12/15/23 0533  NA 140 141 139  K 3.4* 3.7 3.8  CL 104 97* 99  CO2 31 31 32  GLUCOSE 101* 105* 98  BUN 15 18 20   CREATININE 0.69 0.69 0.77  CALCIUM  7.7* 8.7* 8.3*  MG 2.1  --  1.7  PHOS 2.6  --  3.6   Liver Function Tests Recent Labs    12/14/23 0500 12/15/23 0533  AST 386* 397*  ALT 211* 223*  ALKPHOS 74 84  BILITOT 1.0 1.0  PROT 5.7* 6.3*  ALBUMIN  3.6 3.7   No results for input(s): "LIPASE", "AMYLASE" in the last 72 hours. Cardiac Enzymes No results for input(s): "CKTOTAL", "CKMB", "CKMBINDEX", "TROPONINI" in the  last 72 hours.   BNP: BNP (last 3 results) Recent Labs    12/06/23 0034 12/06/23 2116 12/11/23 0918  BNP 587.5* 377.5* 794.2*    ProBNP (last 3 results) No results for input(s): "PROBNP" in the last 8760 hours.   D-Dimer No results for input(s): "DDIMER" in the last 72 hours. Hemoglobin A1C No results for input(s): "HGBA1C" in the last 72 hours. Fasting Lipid Panel No results for input(s): "CHOL", "HDL", "LDLCALC", "TRIG", "CHOLHDL", "LDLDIRECT" in the last 72 hours. Thyroid Function Tests No results for input(s): "TSH", "T4TOTAL", "T3FREE", "THYROIDAB" in the last 72 hours.  Invalid input(s): "FREET3"  Other results:   Imaging    No results found.    Medications:     Scheduled Medications:  arformoterol  15 mcg Nebulization BID   Chlorhexidine  Gluconate Cloth  6 each Topical Daily   clonazePAM  1 mg Per Tube TID   enoxaparin  (LOVENOX ) injection  70 mg Subcutaneous Q12H   feeding supplement (PROSource TF20)  60 mL Per Tube Daily   folic acid   1 mg Per Tube Daily   LORazepam   0-4 mg Intravenous Q8H   multivitamin with minerals  1 tablet Per Tube Daily  OLANZapine   5 mg Per Tube BID   mouth rinse  15 mL Mouth Rinse 4 times per day   pantoprazole   40 mg Oral Daily   revefenacin   175 mcg Nebulization Daily   sodium chloride  flush  10-40 mL Intracatheter Q12H   thiamine   100 mg Per Tube Daily   Or   thiamine   100 mg Intravenous Daily    Infusions:  amiodarone  60 mg/hr (12/15/23 0700)   feeding supplement (VITAL 1.5 CAL) 55 mL/hr at 12/15/23 0700   norepinephrine  (LEVOPHED ) Adult infusion Stopped (12/11/23 1217)    PRN Medications: acetaminophen , haloperidol  lactate, mouth rinse, mouth rinse, sodium chloride  flush    Patient Profile    68 y.o. with history of ETOH abuse and withdrawal, homelessness, HFmrEF, paroxysmal atrial fibrillation, CAD, aspiration PNA, PTX and PE was admitted with metabolic derangement/ETOH withdrawal.  HF Team consulted  for CHF/RV failure    Assessment/Plan   1. ETOH withdrawal: Long h/o ETOH abuse.  Now with ETOH withdrawal and acute delirium possibly superimposed on baseline dementia.  CIWA. Holding precedex .  - Per CCM.  2. Acute on chronic HF with mid range EF with prominent RV dysfunction: Low LV EF may be related to ETOH abuse. Echo this admission with mildly lower EF than the past, EF 35-40%, moderate RV enlargement and moderate RV dysfunction.  Significant RV dysfunction, he has history of PEs and has not been compliant with anticoagulation.  Brisk diuresis with IV lasix . CVP down 4. Stop IV lasix . Start po lasix . 40 mg daily.  - Start entresto  49-51 mg twice a day - Start spiro 12.5 mg daily  - Would recommend V/Q scan in the future to assess for chronic PEs if he follows up in the clinic.  3. H/o PEs: no DVT on US  this admission.  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 Do not think he has been taking his meds at home.  - Eventual V/Q scan. If he follows up in clinic.  - Continue Lovenox .  4. ID: Suspect component of septic shock.  GNR bacteremia, source uncertain (?pulmonary).  Additionally has non-COVID coronavirus infection.  - Completed antibiotics. Possible component of COPD exacerbation.  - Nebs per pulmonary.  6. 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  Liver did not appear cirrhotic on imaging.  7. Atrial fibrillation: Persistent.  Has not been compliant with anticoagulation at home.   - Remains uncontrolled.  Given 300 mg amio bolus + 0.25 mg digoxin .  - Consider bb tomorrow.  - No plan for DCCV.  - Continue Lovenox .  8. Homeless   Length of Stay: 7  Elijio Staples Marijane Shoulders, NP  12/15/2023, 7:58 AM  Advanced Heart Failure Team Pager [phone] (M-F; 7a - 5p)  Please contact [facility] for night-coverage after hours (5p -7a ) and weekends on [email or web address]

## 2023-12-15 NOTE — Plan of Care (Signed)

## 2023-12-15 NOTE — TOC Benefit Eligibility Note (Signed)
 Pharmacy Patient Advocate Encounter  Insurance verification completed.    The patient is insured through [employer]. Patient has Medicare and is not eligible for a copay card, but may be able to apply for patient assistance or Medicare RX Payment Plan (Patient Must reach out to their plan, if eligible for payment plan), if available.    Ran test claim for Entesto 24-26mg  and the current 30 day co-pay is $0.  Ran test claim for Farxiga 10mg  and the current 30 day co-pay is $0.   Ran test claim for Jardiance 10mg  and the current 30 day co-pay is $0.   This test claim was processed through [employer]- copay amounts may vary at other pharmacies due to [employer], or as the patient moves through the different stages of their insurance plan.

## 2023-12-15 NOTE — Plan of Care (Signed)
  Problem: Safety: Goal: Non-violent Restraint(s) Outcome: Progressing   Problem: Clinical Measurements: Goal: Ability to maintain clinical measurements within normal limits will improve Outcome: Progressing Goal: Will remain free from infection Outcome: Progressing Goal: Diagnostic test results will improve Outcome: Progressing Goal: Respiratory complications will improve Outcome: Progressing Goal: Cardiovascular complication will be avoided Outcome: Progressing   Problem: Nutrition: Goal: Adequate nutrition will be maintained Outcome: Progressing   Problem: Elimination: Goal: Will not experience complications related to bowel motility Outcome: Progressing Goal: Will not experience complications related to urinary retention Outcome: Progressing   Problem: Pain Managment: Goal: General experience of comfort will improve and/or be controlled Outcome: Progressing   Problem: Safety: Goal: Ability to remain free from injury will improve Outcome: Progressing   Problem: Skin Integrity: Goal: Risk for impaired skin integrity will decrease Outcome: Progressing   Problem: Education: Goal: Knowledge of General Education information will improve Description: Including pain rating scale, medication(s)/side effects and non-pharmacologic comfort measures Outcome: Not Progressing   Problem: Health Behavior/Discharge Planning: Goal: Ability to manage health-related needs will improve Outcome: Not Progressing   Problem: Activity: Goal: Risk for activity intolerance will decrease Outcome: Not Progressing   Problem: Coping: Goal: Level of anxiety will decrease Outcome: Not Progressing  12/15/2023 1626  Laray Platt, RN
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
## 2023-12-16 ENCOUNTER — Inpatient Hospital Stay (HOSPITAL_COMMUNITY)

## 2023-12-16 DIAGNOSIS — I4891 Unspecified atrial fibrillation: Secondary | ICD-10-CM | POA: Diagnosis not present

## 2023-12-16 DIAGNOSIS — I509 Heart failure, unspecified: Secondary | ICD-10-CM | POA: Diagnosis not present

## 2023-12-16 DIAGNOSIS — R6521 Severe sepsis with septic shock: Secondary | ICD-10-CM | POA: Diagnosis not present

## 2023-12-16 DIAGNOSIS — E162 Hypoglycemia, unspecified: Secondary | ICD-10-CM | POA: Diagnosis not present

## 2023-12-16 DIAGNOSIS — R569 Unspecified convulsions: Secondary | ICD-10-CM | POA: Diagnosis not present

## 2023-12-16 DIAGNOSIS — E871 Hypo-osmolality and hyponatremia: Secondary | ICD-10-CM | POA: Diagnosis not present

## 2023-12-16 DIAGNOSIS — A419 Sepsis, unspecified organism: Secondary | ICD-10-CM | POA: Diagnosis not present

## 2023-12-16 DIAGNOSIS — R4182 Altered mental status, unspecified: Secondary | ICD-10-CM

## 2023-12-16 DIAGNOSIS — I5020 Unspecified systolic (congestive) heart failure: Secondary | ICD-10-CM | POA: Diagnosis not present

## 2023-12-16 LAB — CBC
HCT: 18.9 % — ABNORMAL LOW (ref 39.0–52.0)
HCT: 23.1 % — ABNORMAL LOW (ref 39.0–52.0)
HCT: 23 % — ABNORMAL LOW (ref 39.0–52.0)
Hemoglobin: 6 g/dL — CL (ref 13.0–17.0)
Hemoglobin: 7.6 g/dL — ABNORMAL LOW (ref 13.0–17.0)
Hemoglobin: 7.4 g/dL — ABNORMAL LOW (ref 13.0–17.0)
MCH: 29.4 pg (ref 26.0–34.0)
MCH: 29.5 pg (ref 26.0–34.0)
MCH: 28.8 pg (ref 26.0–34.0)
MCHC: 31.7 g/dL (ref 30.0–36.0)
MCHC: 32.9 g/dL (ref 30.0–36.0)
MCHC: 32.2 g/dL (ref 30.0–36.0)
MCV: 92.6 fL (ref 80.0–100.0)
MCV: 89.5 fL (ref 80.0–100.0)
MCV: 89.5 fL (ref 80.0–100.0)
Platelets: 125 10*3/uL — ABNORMAL LOW (ref 150–400)
Platelets: 91 10*3/uL — ABNORMAL LOW (ref 150–400)
Platelets: 92 10*3/uL — ABNORMAL LOW (ref 150–400)
RBC: 2.04 MIL/uL — ABNORMAL LOW (ref 4.22–5.81)
RBC: 2.58 MIL/uL — ABNORMAL LOW (ref 4.22–5.81)
RBC: 2.57 MIL/uL — ABNORMAL LOW (ref 4.22–5.81)
RDW: 20.2 % — ABNORMAL HIGH (ref 11.5–15.5)
RDW: 18.3 % — ABNORMAL HIGH (ref 11.5–15.5)
RDW: 18.8 % — ABNORMAL HIGH (ref 11.5–15.5)
WBC: 7.2 10*3/uL (ref 4.0–10.5)
WBC: 3.2 10*3/uL — ABNORMAL LOW (ref 4.0–10.5)
WBC: 5.8 10*3/uL (ref 4.0–10.5)
nRBC: 0 % (ref 0.0–0.2)
nRBC: 0 % (ref 0.0–0.2)
nRBC: 0 % (ref 0.0–0.2)

## 2023-12-16 LAB — COMPREHENSIVE METABOLIC PANEL WITH GFR
ALT: 168 U/L — ABNORMAL HIGH (ref 0–44)
AST: 251 U/L — ABNORMAL HIGH (ref 15–41)
Albumin: 3.2 g/dL — ABNORMAL LOW (ref 3.5–5.0)
Alkaline Phosphatase: 75 U/L (ref 38–126)
Anion gap: 7 (ref 5–15)
BUN: 28 mg/dL — ABNORMAL HIGH (ref 8–23)
CO2: 32 mmol/L (ref 22–32)
Calcium: 8.1 mg/dL — ABNORMAL LOW (ref 8.9–10.3)
Chloride: 100 mmol/L (ref 98–111)
Creatinine, Ser: 0.69 mg/dL (ref 0.61–1.24)
GFR, Estimated: >60 mL/min (ref >60)
Glucose, Bld: 115 mg/dL — ABNORMAL HIGH (ref 70–99)
Potassium: 4.2 mmol/L (ref 3.5–5.1)
Sodium: 139 mmol/L (ref 135–145)
Total Bilirubin: 0.9 mg/dL (ref 0.0–1.2)
Total Protein: 6 g/dL — ABNORMAL LOW (ref 6.5–8.1)

## 2023-12-16 LAB — PROTIME-INR
INR: 1.4 — ABNORMAL HIGH (ref 0.8–1.2)
Prothrombin Time: 17.6 s — ABNORMAL HIGH (ref 11.4–15.2)

## 2023-12-16 LAB — GLUCOSE, CAPILLARY
Glucose-Capillary: 75 mg/dL (ref 70–99)
Glucose-Capillary: 73 mg/dL (ref 70–99)
Glucose-Capillary: 114 mg/dL — ABNORMAL HIGH (ref 70–99)
Glucose-Capillary: 132 mg/dL — ABNORMAL HIGH (ref 70–99)
Glucose-Capillary: 117 mg/dL — ABNORMAL HIGH (ref 70–99)
Glucose-Capillary: 104 mg/dL — ABNORMAL HIGH (ref 70–99)

## 2023-12-16 LAB — PREPARE RBC (CROSSMATCH)

## 2023-12-16 LAB — ABO/RH: ABO/RH(D): A POS

## 2023-12-16 LAB — PHOSPHORUS: Phosphorus: 4 mg/dL (ref 2.5–4.6)

## 2023-12-16 LAB — MAGNESIUM: Magnesium: 1.9 mg/dL (ref 1.7–2.4)

## 2023-12-16 MED ORDER — FUROSEMIDE 40 MG PO TABS
40.0000 mg | ORAL_TABLET | Freq: Every day | ORAL | Status: DC
  Administered 2023-12-17: 40 mg via ORAL
  Filled 2023-12-16: qty 1

## 2023-12-16 MED ORDER — SODIUM CHLORIDE 0.9% IV SOLUTION: Freq: Once | INTRAVENOUS | Status: AC

## 2023-12-16 MED ORDER — SODIUM CHLORIDE 0.9% IV SOLUTION: Freq: Once | INTRAVENOUS | Status: DC

## 2023-12-16 MED ORDER — SODIUM CHLORIDE 0.9 % IV SOLN
50.0000 ug/h | INTRAVENOUS | Status: DC
  Administered 2023-12-16: 50 ug/h via INTRAVENOUS
  Filled 2023-12-16 (×3): qty 1

## 2023-12-16 MED ORDER — DEXTROSE 10 % IV SOLN
INTRAVENOUS | Status: DC
  Filled 2023-12-16: qty 1000

## 2023-12-16 MED ORDER — MAGNESIUM SULFATE 2 GM/50ML IV SOLN
2.0000 g | Freq: Once | INTRAVENOUS | Status: AC
  Administered 2023-12-16: 2 g via INTRAVENOUS
  Filled 2023-12-16: qty 50

## 2023-12-16 MED ORDER — OLANZAPINE 5 MG PO TABS
5.0000 mg | ORAL_TABLET | Freq: Every day | ORAL | Status: DC
  Administered 2023-12-16 – 2023-12-18 (×2): 5 mg via ORAL
  Filled 2023-12-16 (×3): qty 1

## 2023-12-16 MED ORDER — PANTOPRAZOLE SODIUM 40 MG IV SOLR
40.0000 mg | Freq: Two times a day (BID) | INTRAVENOUS | Status: DC
  Administered 2023-12-16 (×2): 40 mg via INTRAVENOUS
  Filled 2023-12-16 (×3): qty 10

## 2023-12-16 NOTE — Progress Notes (Signed)
 Advanced Heart Failure Rounding Note  Cardiologist: Antoinette Batman, MD  Chief Complaint: RV Failure  Subjective:   5/28 Given IV dig and continued on amio drip.   Remains in A fib   Drowsy  Objective:   Weight Range: 75 kg Body mass index is 25.14 kg/m.   Vital Signs:   Temp:  [97.7 F (36.5 C)-100.3 F (37.9 C)] 99.9 F (37.7 C) (05/29 0857) Pulse Rate:  [106-139] 124 (05/29 0857) Resp:  [18-37] 21 (05/29 0857) BP: (86-137)/(54-95) 100/64 (05/29 0857) SpO2:  [91 %-100 %] 99 % (05/29 0857) Weight:  [75 kg] 75 kg (05/29 0405) Last BM Date : 12/15/23  Weight change: Filed Weights   12/09/23 0000 12/11/23 0500 12/16/23 0405  Weight: 70.4 kg 74.3 kg 75 kg    Intake/Output:   Intake/Output Summary (Last 24 hours) at 12/16/2023 1018 Last data filed at 12/16/2023 1017 Gross per 24 hour  Intake 1753.83 ml  Output 2800 ml  Net -1046.17 ml     CVP 8  Physical Exam  General:   No resp difficulty Neck: supple. JVP 8-9 Cor: PMI nondisplaced. Tachy Irregular rate & rhythm. No rubs, gallops or murmurs. Lungs: clear Abdomen: soft, nontender, nondistended.  Extremities: no cyanosis, clubbing, rash, edema Neuro: mumbling. Withdraws to pain.    Telemetry    A fib 110-120s   EKG    NA Labs    CBC Recent Labs    12/15/23 0533 12/16/23 0444  WBC 6.2 7.2  HGB 8.1* 6.0*  HCT 25.8* 18.9*  MCV 91.8 92.6  PLT 172 125*   Basic Metabolic Panel Recent Labs    16/10/96 0533 12/16/23 0345  NA 139 139  K 3.8 4.2  CL 99 100  CO2 32 32  GLUCOSE 98 115*  BUN 20 28*  CREATININE 0.77 0.69  CALCIUM  8.3* 8.1*  MG 1.7 1.9  PHOS 3.6 4.0   Liver Function Tests Recent Labs    12/15/23 0533 12/16/23 0345  AST 397* 251*  ALT 223* 168*  ALKPHOS 84 75  BILITOT 1.0 0.9  PROT 6.3* 6.0*  ALBUMIN  3.7 3.2*   No results for input(s): "LIPASE", "AMYLASE" in the last 72 hours. Cardiac Enzymes No results for input(s): "CKTOTAL", "CKMB", "CKMBINDEX",  "TROPONINI" in the last 72 hours.   BNP: BNP (last 3 results) Recent Labs    12/06/23 0034 12/06/23 2116 12/11/23 0918  BNP 587.5* 377.5* 794.2*    ProBNP (last 3 results) No results for input(s): "PROBNP" in the last 8760 hours.   D-Dimer No results for input(s): "DDIMER" in the last 72 hours. Hemoglobin A1C No results for input(s): "HGBA1C" in the last 72 hours. Fasting Lipid Panel No results for input(s): "CHOL", "HDL", "LDLCALC", "TRIG", "CHOLHDL", "LDLDIRECT" in the last 72 hours. Thyroid Function Tests No results for input(s): "TSH", "T4TOTAL", "T3FREE", "THYROIDAB" in the last 72 hours.  Invalid input(s): "FREET3"  Other results:   Imaging    No results found.    Medications:     Scheduled Medications:  sodium chloride    Intravenous Once   Chlorhexidine  Gluconate Cloth  6 each Topical Daily   feeding supplement (PROSource TF20)  60 mL Per Tube Daily   folic acid   1 mg Per Tube Daily   furosemide   40 mg Oral Daily   Gerhardt's butt cream   Topical BID   lactulose  10 g Per Tube TID   OLANZapine   5 mg Oral QHS   mouth rinse  15 mL Mouth  Rinse 4 times per day   pantoprazole  (PROTONIX ) IV  40 mg Intravenous Q12H   sodium chloride  flush  10-40 mL Intracatheter Q12H   spironolactone   12.5 mg Oral Daily   thiamine   100 mg Per Tube Daily   Or   thiamine   100 mg Intravenous Daily    Infusions:  amiodarone  60 mg/hr (12/16/23 0916)   dextrose      feeding supplement (VITAL 1.5 CAL) 55 mL/hr at 12/16/23 0700   norepinephrine  (LEVOPHED ) Adult infusion Stopped (12/11/23 1217)   octreotide  (SANDOSTATIN ) 500 mcg in sodium chloride  0.9 % 250 mL (2 mcg/mL) infusion 50 mcg/hr (12/16/23 0922)    PRN Medications: acetaminophen , haloperidol  lactate, mouth rinse, mouth rinse, sodium chloride  flush    Patient Profile    68 y.o. with history of ETOH abuse and withdrawal, homelessness, HFmrEF, paroxysmal atrial fibrillation, CAD, aspiration PNA, PTX and PE was  admitted with metabolic derangement/ETOH withdrawal.  HF Team consulted for CHF/RV failure    Assessment/Plan   1. ETOH withdrawal: Long h/o ETOH abuse.  Now with ETOH withdrawal and acute delirium possibly superimposed on baseline dementia.  CIWA.  - Per CCM.  2. Acute on chronic HF with mid range EF with prominent RV dysfunction: Low LV EF may be related to ETOH abuse. Echo this admission with mildly lower EF than the past, EF 35-40%, moderate RV enlargement and moderate RV dysfunction.  Significant RV dysfunction, he has history of PEs and has not been compliant with anticoagulation.  Brisk diuresis with IV lasix . CVP 7-8. Continue po lasix . 40 mg daily.  - Hold entresto   - Continue  spiro 12.5 mg daily  - Could  recommend V/Q scan in the future to assess for chronic PEs if he follows up in the clinic.  3. H/o PEs: no DVT on US  this admission.  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 Do not think he has been taking his meds at home.  - Eventual V/Q scan. If he follows up in clinic.  - Holding lovenox .  Hgb 6 4. ID: Suspect component of septic shock.  GNR bacteremia, source uncertain (?pulmonary).  Additionally has non-COVID coronavirus infection.  - Completed antibiotics. Possible component of COPD exacerbation.  - Nebs per pulmonary.  6. 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  Liver did not appear cirrhotic on imaging.  7. Atrial fibrillation: Persistent.  Has not been compliant with anticoagulation at home.   - Remains uncontrolled.  Continue amio drip.  - Consider bb tomorrow.  - No plan for DCCV.  - Off lovenox , hgb 6.  8. Anemia Hgb 6. No obvious source. Off lovenox . Getting octreotide .  Getting unit of blood.     Length of Stay: 8  Ved Martos, NP  12/16/2023, 10:18 AM  Advanced Heart Failure Team Pager [phone] (M-F; 7a - 5p)  Please contact [facility] for night-coverage after hours (5p -7a ) and weekends on [email or web address]

## 2023-12-16 NOTE — Progress Notes (Signed)
 EEG complete - results pending

## 2023-12-16 NOTE — Plan of Care (Signed)
  Problem: Safety: Goal: Non-violent Restraint(s) Outcome: Progressing   Problem: Clinical Measurements: Goal: Ability to maintain clinical measurements within normal limits will improve Outcome: Progressing Goal: Will remain free from infection Outcome: Progressing Goal: Diagnostic test results will improve Outcome: Progressing Goal: Respiratory complications will improve Outcome: Progressing Goal: Cardiovascular complication will be avoided Outcome: Progressing   Problem: Activity: Goal: Risk for activity intolerance will decrease Outcome: Progressing   Problem: Nutrition: Goal: Adequate nutrition will be maintained Outcome: Progressing   Problem: Coping: Goal: Level of anxiety will decrease Outcome: Progressing   Problem: Elimination: Goal: Will not experience complications related to bowel motility Outcome: Progressing Goal: Will not experience complications related to urinary retention Outcome: Progressing   Problem: Pain Managment: Goal: General experience of comfort will improve and/or be controlled Outcome: Progressing   Problem: Safety: Goal: Ability to remain free from injury will improve Outcome: Progressing   Problem: Education: Goal: Knowledge of General Education information will improve Description: Including pain rating scale, medication(s)/side effects and non-pharmacologic comfort measures Outcome: Not Progressing   Problem: Health Behavior/Discharge Planning: Goal: Ability to manage health-related needs will improve Outcome: Not Progressing   Problem: Skin Integrity: Goal: Risk for impaired skin integrity will decrease Outcome: Not Progressing   12/16/2023 1706 Laray Platt, RN

## 2023-12-16 NOTE — Progress Notes (Signed)
 eLink Physician-Brief Progress Note Patient Name: Anthony A Wiedman Jr. DOB: Dec 09, 1955 MRN: [id number]   Date of Service  12/16/2023  HPI/Events of Note  Hg at 6, no active bleeding per RN discussion. Slowly decreasing Hg since admit. Not on pressors.   eICU Interventions  1 PRBC ordered and follow Hg post      Intervention Category Intermediate Interventions: Other:  Rexann Catalan 12/16/2023, 5:40 AM
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
## 2023-12-16 NOTE — Procedures (Signed)
 Patient Name: Anthony A Ebbert Jr.  MRN: [id number]  Epilepsy Attending: Arleene Lack  Referring Physician/Provider: Josiah Nigh, MD  Date: 12/16/2023 Duration: 22.37 mins  Patient history: 68yo M with ams. EEG to evaluate for seizure  Level of alertness: comatose/ lethargic  AEDs during EEG study: Clonazepam  Technical aspects: This EEG study was done with scalp electrodes positioned according to the 10-20 International system of electrode placement. Electrical activity was reviewed with band pass filter of 1-70Hz , sensitivity of 7 uV/mm, display speed of 62mm/sec with a 60Hz  notched filter applied as appropriate. EEG data were recorded continuously and digitally stored.  Video monitoring was available and reviewed as appropriate.  Description: EEG showed continuous generalized 3-6hz  theta-delta slowing admixed with an excessive amount of 15 to 18 Hz beta activity distributed symmetrically and diffusely. Hyperventilation and photic stimulation were not performed.     ABNORMALITY - Continuous slow, generalized - Excessive beta, generalized  IMPRESSION: This study is suggestive of moderate diffuse encephalopathy. The excessive beta activity seen in the background is most likely due to the effect of benzodiazepine and is a benign EEG pattern. No seizures or epileptiform discharges were seen throughout the recording.  Magdalena Skilton O Chakara Bognar

## 2023-12-17 ENCOUNTER — Inpatient Hospital Stay (HOSPITAL_COMMUNITY)

## 2023-12-17 DIAGNOSIS — Z7189 Other specified counseling: Secondary | ICD-10-CM | POA: Diagnosis not present

## 2023-12-17 DIAGNOSIS — E162 Hypoglycemia, unspecified: Secondary | ICD-10-CM | POA: Diagnosis not present

## 2023-12-17 DIAGNOSIS — R6521 Severe sepsis with septic shock: Secondary | ICD-10-CM | POA: Diagnosis not present

## 2023-12-17 DIAGNOSIS — R627 Adult failure to thrive: Secondary | ICD-10-CM

## 2023-12-17 DIAGNOSIS — A419 Sepsis, unspecified organism: Secondary | ICD-10-CM | POA: Diagnosis not present

## 2023-12-17 DIAGNOSIS — I5020 Unspecified systolic (congestive) heart failure: Secondary | ICD-10-CM | POA: Diagnosis not present

## 2023-12-17 DIAGNOSIS — E871 Hypo-osmolality and hyponatremia: Secondary | ICD-10-CM | POA: Diagnosis not present

## 2023-12-17 LAB — BASIC METABOLIC PANEL WITH GFR
Anion gap: 8 (ref 5–15)
BUN: 26 mg/dL — ABNORMAL HIGH (ref 8–23)
CO2: 29 mmol/L (ref 22–32)
Calcium: 8.2 mg/dL — ABNORMAL LOW (ref 8.9–10.3)
Chloride: 102 mmol/L (ref 98–111)
Creatinine, Ser: 0.61 mg/dL (ref 0.61–1.24)
GFR, Estimated: >60 mL/min (ref >60)
Glucose, Bld: 148 mg/dL — ABNORMAL HIGH (ref 70–99)
Potassium: 3.7 mmol/L (ref 3.5–5.1)
Sodium: 139 mmol/L (ref 135–145)

## 2023-12-17 LAB — CBC
HCT: 21 % — ABNORMAL LOW (ref 39.0–52.0)
HCT: 22.9 % — ABNORMAL LOW (ref 39.0–52.0)
Hemoglobin: 6.8 g/dL — CL (ref 13.0–17.0)
Hemoglobin: 7.4 g/dL — ABNORMAL LOW (ref 13.0–17.0)
MCH: 29.2 pg (ref 26.0–34.0)
MCH: 29.6 pg (ref 26.0–34.0)
MCHC: 32.4 g/dL (ref 30.0–36.0)
MCHC: 32.3 g/dL (ref 30.0–36.0)
MCV: 90.1 fL (ref 80.0–100.0)
MCV: 91.6 fL (ref 80.0–100.0)
Platelets: 94 10*3/uL — ABNORMAL LOW (ref 150–400)
Platelets: 87 10*3/uL — ABNORMAL LOW (ref 150–400)
RBC: 2.33 MIL/uL — ABNORMAL LOW (ref 4.22–5.81)
RBC: 2.5 MIL/uL — ABNORMAL LOW (ref 4.22–5.81)
RDW: 18.6 % — ABNORMAL HIGH (ref 11.5–15.5)
RDW: 18 % — ABNORMAL HIGH (ref 11.5–15.5)
WBC: 6.4 10*3/uL (ref 4.0–10.5)
WBC: 6.8 10*3/uL (ref 4.0–10.5)
nRBC: 0 % (ref 0.0–0.2)
nRBC: 0 % (ref 0.0–0.2)

## 2023-12-17 LAB — HEMOGLOBIN AND HEMATOCRIT, BLOOD
HCT: 21.5 % — ABNORMAL LOW (ref 39.0–52.0)
HCT: 24.1 % — ABNORMAL LOW (ref 39.0–52.0)
Hemoglobin: 7 g/dL — ABNORMAL LOW (ref 13.0–17.0)
Hemoglobin: 8 g/dL — ABNORMAL LOW (ref 13.0–17.0)

## 2023-12-17 LAB — GLUCOSE, CAPILLARY
Glucose-Capillary: 143 mg/dL — ABNORMAL HIGH (ref 70–99)
Glucose-Capillary: 123 mg/dL — ABNORMAL HIGH (ref 70–99)
Glucose-Capillary: 96 mg/dL (ref 70–99)
Glucose-Capillary: 119 mg/dL — ABNORMAL HIGH (ref 70–99)
Glucose-Capillary: 101 mg/dL — ABNORMAL HIGH (ref 70–99)
Glucose-Capillary: 126 mg/dL — ABNORMAL HIGH (ref 70–99)

## 2023-12-17 LAB — PREPARE RBC (CROSSMATCH)

## 2023-12-17 LAB — PHOSPHORUS: Phosphorus: 2.6 mg/dL (ref 2.5–4.6)

## 2023-12-17 LAB — MAGNESIUM: Magnesium: 1.7 mg/dL (ref 1.7–2.4)

## 2023-12-17 MED ORDER — SODIUM CHLORIDE 0.9% IV SOLUTION: Freq: Once | INTRAVENOUS | Status: AC

## 2023-12-17 MED ORDER — PANTOPRAZOLE SODIUM 40 MG IV SOLR
40.0000 mg | Freq: Every day | INTRAVENOUS | Status: DC
  Administered 2023-12-18 – 2023-12-21 (×4): 40 mg via INTRAVENOUS
  Filled 2023-12-17 (×4): qty 10

## 2023-12-17 MED ORDER — IOHEXOL 350 MG/ML SOLN
100.0000 mL | Freq: Once | INTRAVENOUS | Status: AC | PRN
  Administered 2023-12-17: 100 mL via INTRAVENOUS

## 2023-12-17 MED ORDER — FUROSEMIDE 10 MG/ML IJ SOLN
40.0000 mg | Freq: Once | INTRAMUSCULAR | Status: AC
  Administered 2023-12-17: 40 mg via INTRAVENOUS
  Filled 2023-12-17: qty 4

## 2023-12-17 MED ORDER — LACTULOSE 10 GM/15ML PO SOLN
10.0000 g | Freq: Every day | ORAL | Status: DC
  Administered 2023-12-18 – 2023-12-20 (×3): 10 g
  Filled 2023-12-17 (×2): qty 15
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
## 2023-12-17 NOTE — Progress Notes (Signed)
 Nutrition Follow-up  DOCUMENTATION CODES:   Non-severe (moderate) malnutrition in context of chronic illness  INTERVENTION:  TF via Cortrak: Vital 1.5 at 55 ml/h (1320 ml per day) Prosource TF20 60 ml once daily Provides 2060 kcal, 109 gm protein, 1008 ml free water  daily   Continue MVI, folvite  and thiamine  in the setting of ETOH use  NUTRITION DIAGNOSIS:   Moderate Malnutrition related to social / environmental circumstances (ETOH use, homeless) as evidenced by moderate fat depletion, moderate muscle depletion. - remains applicable  GOAL:   Patient will meet greater than or equal to 90% of their needs - goal met via TF  MONITOR:   Diet advancement, Labs, Weight trends, TF tolerance  REASON FOR ASSESSMENT:   Consult Assessment of nutrition requirement/status  ASSESSMENT:   68 yo male admitted with EtOH withdrawal with DTs, lactic acidosis, concern for possible sepsis-?biliary source. PMH includes EtOH abuse with recurrent admissions for DTs, tobacco use, hiatal hernia s/p nissen (2016), HTN. Pt is homeless  5/21: CT and US  - findings of gallstones/sludge and GBW thickening; fatty liver, non cirrhotic; unremarkable pancreas 5/23: HIDA- normal GB filling but no emptying in the small bowel; Cortrak placed 5/30: CTA- retroperitoneal hematoma within the R iliopsoas musculature; mild to moderate mass effect on adjacent pelvic organs; diffuse anasarca and free fluid within the abdomen and pelvis  Pt remains admitted d/t FTT, ongoing profound debility and multiorgan dysfunction. Palliative consulted.   HF following d/t biventricular HF. Pt remains in afib with RVR, unable to cardiovert.   Pt sleeping at time of visit. Noted to continue to wax and wane.  Spoke with RN who reports he has been much more awake and alert today.  Pt having non-bloody bowel movements however his stomach was more taut today. Hgb continues to trend down. S/p CT scan.   Reached out to CCM who is  amenable to resuming TF.   Admit weight: 65 kg Current weight: 75.2 kg  Drains/lines: UOP: x24 hours  Medications: folvite , lasix  40mg  daily, lactulose  TID, protonix , thiamine  Drips: D10 @ 25ml/hr  Labs:  BUN 26 Hgb 7.0 CBG's 73-140 x24 hours  Diet Order:   Diet Order             Diet NPO time specified  Diet effective midnight                   EDUCATION NEEDS:   No education needs have been identified at this time  Skin:  Skin Assessment: Skin Integrity Issues: Skin Integrity Issues:: Stage II Stage II: L tibial  Last BM:  via FMS x24 hours  Height:   Ht Readings from Last 1 Encounters:  12/09/23 5\' 8"  (1.727 m)    Weight:   Wt Readings from Last 1 Encounters:  12/17/23 75.2 kg    Ideal Body Weight:  70 kg  BMI:  Body mass index is 25.21 kg/m.  Estimated Nutritional Needs:   Kcal:  1900-2100  Protein:  95-110g  Fluid:  >/=1.9L  Rocklin Chute, RDN, LDN Clinical Nutrition See AMiON for contact information.

## 2023-12-17 NOTE — Progress Notes (Signed)
 eLink Physician-Brief Progress Note Patient Name: Anthony A Penrod Jr. DOB: 09/21/1955 MRN: [id number]   Date of Service  12/17/2023  HPI/Events of Note  Hemoglobin of 6.8, with slow downtrend from 7.4 earlier in the day.  No obvioussource of bleeding  eICU Interventions  Transfuse 1 unit pRBC CT angio Abd/pel per notes GI consult pending     Intervention Category Intermediate Interventions: Bleeding - evaluation and treatment with blood products  Kennieth Plotts 12/17/2023, 12:23 AM
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
## 2023-12-17 NOTE — Plan of Care (Incomplete)
  Problem: Clinical Measurements: Goal: Ability to maintain clinical measurements within normal limits will improve Outcome: Progressing Goal: Will remain free from infection Outcome: Progressing Goal: Diagnostic test results will improve Outcome: Progressing Goal: Respiratory complications will improve Outcome: Progressing Goal: Cardiovascular complication will be avoided Outcome: Progressing   Problem: Activity: Goal: Risk for activity intolerance will decrease Outcome: Progressing   Problem: Nutrition: Goal: Adequate nutrition will be maintained Outcome: Progressing   Problem: Elimination: Goal: Will not experience complications related to bowel motility Outcome: Progressing Goal: Will not experience complications related to urinary retention Outcome: Progressing   Problem: Pain Managment: Goal: General experience of comfort will improve and/or be controlled Outcome: Progressing   Problem: Safety: Goal: Non-violent Restraint(s) Outcome: Not Progressing   Problem: Education: Goal: Knowledge of General Education information will improve Description: Including pain rating scale, medication(s)/side effects and non-pharmacologic comfort measures Outcome: Not Progressing   Problem: Health Behavior/Discharge Planning: Goal: Ability to manage health-related needs will improve Outcome: Not Progressing   Problem: Coping: Goal: Level of anxiety will decrease Outcome: Not Progressing   Problem: Safety: Goal: Ability to remain free from injury will improve Outcome: Not Progressing   Problem: Skin Integrity: Goal: Risk for impaired skin integrity will decrease Outcome: Not Progressing  12/17/2023 1649 Laray Platt, RN

## 2023-12-17 NOTE — Progress Notes (Signed)
 12/17/2023 CTA noted and discussed with radiologist If there is a blush it is tiny Hold AC, monitor H/H, it should tamponade DC octreotide, okay to switch PPI to qday  Ardelle Kos MD PCCM

## 2023-12-18 DIAGNOSIS — E871 Hypo-osmolality and hyponatremia: Secondary | ICD-10-CM | POA: Diagnosis not present

## 2023-12-18 DIAGNOSIS — I5020 Unspecified systolic (congestive) heart failure: Secondary | ICD-10-CM | POA: Diagnosis not present

## 2023-12-18 DIAGNOSIS — R6521 Severe sepsis with septic shock: Secondary | ICD-10-CM | POA: Diagnosis not present

## 2023-12-18 DIAGNOSIS — A419 Sepsis, unspecified organism: Secondary | ICD-10-CM | POA: Diagnosis not present

## 2023-12-18 LAB — GLUCOSE, CAPILLARY
Glucose-Capillary: 106 mg/dL — ABNORMAL HIGH (ref 70–99)
Glucose-Capillary: 109 mg/dL — ABNORMAL HIGH (ref 70–99)
Glucose-Capillary: 68 mg/dL — ABNORMAL LOW (ref 70–99)
Glucose-Capillary: 81 mg/dL (ref 70–99)
Glucose-Capillary: 82 mg/dL (ref 70–99)
Glucose-Capillary: 92 mg/dL (ref 70–99)
Glucose-Capillary: 93 mg/dL (ref 70–99)

## 2023-12-18 LAB — BASIC METABOLIC PANEL WITH GFR
Anion gap: 10 (ref 5–15)
BUN: 25 mg/dL — ABNORMAL HIGH (ref 8–23)
CO2: 29 mmol/L (ref 22–32)
Calcium: 8.2 mg/dL — ABNORMAL LOW (ref 8.9–10.3)
Chloride: 100 mmol/L (ref 98–111)
Creatinine, Ser: 0.63 mg/dL (ref 0.61–1.24)
GFR, Estimated: >60 mL/min (ref >60)
Glucose, Bld: 111 mg/dL — ABNORMAL HIGH (ref 70–99)
Potassium: 3.2 mmol/L — ABNORMAL LOW (ref 3.5–5.1)
Sodium: 139 mmol/L (ref 135–145)

## 2023-12-18 LAB — AMMONIA: Ammonia: 16 umol/L (ref 9–35)

## 2023-12-18 LAB — HEMOGLOBIN AND HEMATOCRIT, BLOOD
HCT: 22 % — ABNORMAL LOW (ref 39.0–52.0)
HCT: 22 % — ABNORMAL LOW (ref 39.0–52.0)
HCT: 22.1 % — ABNORMAL LOW (ref 39.0–52.0)
Hemoglobin: 7.1 g/dL — ABNORMAL LOW (ref 13.0–17.0)
Hemoglobin: 7.2 g/dL — ABNORMAL LOW (ref 13.0–17.0)
Hemoglobin: 7.3 g/dL — ABNORMAL LOW (ref 13.0–17.0)

## 2023-12-18 LAB — PREPARE RBC (CROSSMATCH)

## 2023-12-18 LAB — PROTIME-INR
INR: 1.3 — ABNORMAL HIGH (ref 0.8–1.2)
Prothrombin Time: 16 s — ABNORMAL HIGH (ref 11.4–15.2)

## 2023-12-18 LAB — PHOSPHORUS: Phosphorus: 2.9 mg/dL (ref 2.5–4.6)

## 2023-12-18 LAB — MAGNESIUM: Magnesium: 1.6 mg/dL — ABNORMAL LOW (ref 1.7–2.4)

## 2023-12-18 MED ORDER — MAGNESIUM SULFATE 4 GM/100ML IV SOLN
4.0000 g | Freq: Once | INTRAVENOUS | Status: AC
  Administered 2023-12-18: 4 g via INTRAVENOUS
  Filled 2023-12-18: qty 100

## 2023-12-18 MED ORDER — POTASSIUM CHLORIDE 10 MEQ/50ML IV SOLN
10.0000 meq | INTRAVENOUS | Status: AC
  Administered 2023-12-18 (×4): 10 meq via INTRAVENOUS
  Filled 2023-12-18 (×2): qty 50

## 2023-12-18 MED ORDER — POTASSIUM CHLORIDE 20 MEQ PO PACK
20.0000 meq | PACK | ORAL | Status: AC
  Administered 2023-12-18 (×2): 20 meq
  Filled 2023-12-18 (×2): qty 1

## 2023-12-18 MED ORDER — SODIUM CHLORIDE 0.9% IV SOLUTION: Freq: Once | INTRAVENOUS | Status: AC

## 2023-12-18 NOTE — Progress Notes (Signed)
 Daily Progress Note   Patient Name: Anthony A Kumagai Jr.       Date: 12/18/2023 DOB: 12/15/1955  Age: 68 y.o. MRN#: [id number] Attending Physician: Josiah Nigh, MD Primary Care Physician: Juvenal Opoka Admit Date: 12/07/2023  Reason for Consultation/Follow-up: Establishing goals of care  Length of Stay: 10  Current Medications: Scheduled Meds:   sodium chloride    Intravenous Once   Chlorhexidine  Gluconate Cloth  6 each Topical Daily   feeding supplement (PROSource TF20)  60 mL Per Tube Daily   folic acid   1 mg Per Tube Daily   Gerhardt's butt cream   Topical BID   lactulose   10 g Per Tube Daily   OLANZapine   5 mg Oral QHS   mouth rinse  15 mL Mouth Rinse 4 times per day   pantoprazole  (PROTONIX ) IV  40 mg Intravenous Daily   potassium chloride   20 mEq Per Tube Q4H   sodium chloride  flush  10-40 mL Intracatheter Q12H   spironolactone   12.5 mg Oral Daily   thiamine   100 mg Per Tube Daily   Or   thiamine   100 mg Intravenous Daily    Continuous Infusions:  amiodarone  60 mg/hr (12/18/23 0732)   dextrose  10 mL/hr at 12/18/23 0732   feeding supplement (VITAL 1.5 CAL) 55 mL/hr at 12/18/23 0732   norepinephrine  (LEVOPHED ) Adult infusion Stopped (12/11/23 1217)   potassium chloride  10 mEq (12/18/23 0848)    PRN Meds: acetaminophen , haloperidol  lactate, sodium chloride  flush  Physical Exam Vitals reviewed.  Constitutional:      General: He is sleeping.     Comments: mitts             Vital Signs: BP 97/68 (BP Location: Right Arm)   Pulse 95   Temp 99.9 F (37.7 C) (Axillary)   Resp 18   Ht 5\' 8"  (1.727 m)   Wt 75.2 kg   SpO2 97%   BMI 25.21 kg/m  SpO2: SpO2: 97 % O2 Device: O2 Device: Room Air O2 Flow Rate: O2 Flow Rate (L/min): 4 L/min      Patient Active Problem  List   Diagnosis Date Noted   Acute delirium 12/08/2023   Agitation 07/29/2023   History of CVA (cerebrovascular accident) 07/11/2023   Acute pulmonary embolism (HCC) 07/08/2023   Lactic acidosis 07/08/2023  Rib fractures 02/04/2023   Hypoglycemia 02/04/2023   Pulmonary embolus (HCC) 02/04/2023   Pulmonary embolism (HCC) 02/03/2023   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 difficile colitis 09/11/2022   Aspiration pneumonia (HCC) 08/27/2022   Aortic atherosclerosis (HCC) 08/27/2022   Depression, unspecified 08/09/2022   Hyponatremia 06/01/2022   Non compliance w medication regimen 05/31/2022   Delirium 05/25/2022   Stricture and stenosis of esophagus 05/21/2022   Gastritis and gastroduodenitis 05/21/2022   Loose stools 05/20/2022   Malnutrition of moderate degree 05/20/2022   Acute on chronic heart failure (HCC)    Leukocytosis 05/19/2022   Sepsis (HCC) 05/19/2022   Cellulitis of left leg, possible 05/19/2022   Moniliasis, interdigital 05/19/2022   Open toe wound, right foot, medial fourth digit 05/19/2022   Malnutrition (HCC) 05/19/2022   Alcohol  abuse 03/24/2022   Dyslipidemia 03/24/2022   Acute on chronic diastolic CHF (congestive heart failure) (HCC) 03/24/2022   Acute respiratory failure with hypoxia (HCC) 03/23/2022   Generalized weakness    Atrial fibrillation with rapid ventricular response (HCC) 03/15/2022   Hypokalemia 03/15/2022   Decreased functional mobility and endurance 06/22/2021   Iron  deficiency anemia 10/16/2020   Cough    Hypomagnesemia    Protein-calorie malnutrition, severe 08/22/2020   Alcohol  use disorder, severe, in controlled environment (HCC)     Pressure injury of skin 08/18/2020   Urinary tract infection without hematuria    Severe sepsis (HCC) 08/14/2020   Paroxysmal atrial fibrillation (HCC) 08/13/2020   Rhabdomyolysis 07/22/2020   Cerebral ventriculomegaly 07/22/2020   Hemoglobin decreased 07/22/2020   History of delirium tremons 07/22/2020   Hypoalbuminemia due to protein-calorie malnutrition (HCC) 07/22/2020   Tobacco use disorder 07/22/2020   Alcohol  withdrawal delirium (HCC)    Dehydration    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) 07/18/2018   Depression 05/26/2018   Anxiety state 04/25/2018   Need for immunization against influenza 04/25/2018   Alcohol  withdrawal (HCC) 09/01/2017   DOE (dyspnea on exertion) 11/27/2016   Actinic keratosis 11/27/2016   Macrocytic anemia 11/23/2016   History of pulmonary embolus (PE) 11/02/2016   Hiatal hernia 09/14/2016   Skin cancer 08/13/2016   Poor social situation 06/09/2013   Tinea versicolor 06/08/2013   Back pain 05/07/2013   Seborrheic dermatitis of scalp 11/08/2012   HTN (hypertension) 04/11/2012   Alcohol  use disorder, severe, dependence (HCC) 09/16/2006    Palliative Care Assessment & Plan   Patient Profile: 68 y.o. male  with past medical history of HTN, HFmrEF, aortic atherosclerosis, PAF, PE (previously on Eliquis ), EtOH abuse c/b withdrawal/DTs, tobacco abuse, hiatal hernia (s/p Nissen 2016), chronic low back pain, anxiety admitted on 12/07/2023 with shortness of breath.    The day before the patient had presented to ED requesting EtOH detox; patient was deemed to be not actively in EtOH withdrawal and was discharged, however upon discharge patient threatened staff and was escorted off of the premises by [employer].    On ED arrival, patient was afebrile, HR 92, BP 139/117, RR 30, SpO2 90%. Labs were notable for WBC 3.2, Hgb 7.5, Plt 220. Na 139, K 3.7, CO2 14, Cr 0.72. Glucose 61. AST 74, ALT 28, Alk Phos 112, Tbili 0.8. Lipase WNL. Ethanol 57 (221). TSH  WNL. Head  with chronic atrophic changes without acute abnormality. CT A/P without acute process noted. RUQ US  with GB sludge, +GB wall thickening/edema, no stones or ductal dilatation. CIWA protocol initiated. Admitted by TRH for EtOH withdrawal, delirium and hypoglycemia.   Today's Discussion: Reviewed chart and received update from nursing and attending provider.  Psych has been consulted. Nursing reports patient is oriented only to person.  Patient is sleeping and in NAD.  No family at bedside.   If patient continues to not have decisional capacity his family will face treatment option decisions, advanced directive decisions, and anticipatory care needs.  Recommendations/Plan: DNR Full scope Time for outcomes PMT will continue to support   Code Status:    Code Status Orders  (From admission, onward)           Start     Ordered   12/17/23 1318  Do not attempt resuscitation (DNR) Pre-Arrest Interventions Desired  (Code Status)  Continuous       Question Answer Comment  If pulseless and not breathing No CPR or chest compressions.   In Pre-Arrest Conditions (Patient Has Pulse and Is Breathing) May intubate, use advanced airway interventions and cardioversion/ACLS medications if appropriate or indicated. May transfer to ICU.   Consent: Discussion documented in EHR or advanced directives reviewed      12/17/23 1317           Extensive chart review has been completed prior to seeing the patient including labs, vital signs, imaging, progress/consult notes, orders, medications, and available advance directive documents.   Care plan was discussed with bedside RN  Time spent: 25 minutes  Thank you for allowing the Palliative Medicine Team to assist in the care of this patient.    Daina Drum, NP  Please contact Palliative Medicine Team phone at [phone] for questions and concerns.
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
## 2023-12-18 NOTE — Progress Notes (Signed)
 [facility] ADULT ICU REPLACEMENT PROTOCOL   The patient does apply for the [facility] Adult ICU Electrolyte Replacment Protocol based on the criteria listed below:   1.Exclusion criteria: TCTS, ECMO, Dialysis, and Myasthenia Gravis patients 2. Is GFR >/= 30 ml/min? Yes.    Patient's GFR today is >60 3. Is SCr </= 2? Yes.   Patient's SCr is 0.63 mg/dL 4. Did SCr increase >/= 0.5 in 24 hours? No. 5.Pt's weight >40kg  Yes.   6. Abnormal electrolyte(s): k 3.2, mag 1.6  7. Electrolytes replaced per protocol 8.  Call MD STAT for K+ </= 2.5, Phos </= 1, or Mag </= 1 Physician:    [place] Shannahan A 12/18/2023 6:30 AM

## 2023-12-19 DIAGNOSIS — F03911 Unspecified dementia, unspecified severity, with agitation: Secondary | ICD-10-CM

## 2023-12-19 DIAGNOSIS — E871 Hypo-osmolality and hyponatremia: Secondary | ICD-10-CM | POA: Diagnosis not present

## 2023-12-19 DIAGNOSIS — A419 Sepsis, unspecified organism: Secondary | ICD-10-CM | POA: Diagnosis not present

## 2023-12-19 DIAGNOSIS — R410 Disorientation, unspecified: Secondary | ICD-10-CM | POA: Diagnosis not present

## 2023-12-19 DIAGNOSIS — F1010 Alcohol abuse, uncomplicated: Secondary | ICD-10-CM | POA: Diagnosis not present

## 2023-12-19 DIAGNOSIS — I5020 Unspecified systolic (congestive) heart failure: Secondary | ICD-10-CM | POA: Diagnosis not present

## 2023-12-19 DIAGNOSIS — R6521 Severe sepsis with septic shock: Secondary | ICD-10-CM | POA: Diagnosis not present

## 2023-12-19 LAB — BPAM RBC
Blood Product Expiration Date: 202506252359
Blood Product Expiration Date: 202506182359
Blood Product Unit Number: 202506232359
Blood Product Unit Number: 202506252359
ISSUE DATE / TIME: 202505290810
ISSUE DATE / TIME: 202505290810
ISSUE DATE / TIME: 202506182359
ISSUE DATE / TIME: 202506252359
ISSUING PHYSICIAN: 202505290810
PRODUCT CODE: 202505300047
PRODUCT CODE: 202505311637
PRODUCT CODE: 202506232359
PRODUCT CODE: 202506252359
Unit Type and Rh: 6200
Unit Type and Rh: 202505301340
Unit Type and Rh: 202505301340
Unit Type and Rh: 202506182359
Unit Type and Rh: 6200
Unit Type and Rh: 6200
Unit Type and Rh: 6200
Unit Type and Rh: 6200
Unit Type and Rh: 6200

## 2023-12-19 LAB — TYPE AND SCREEN
ABO/RH(D): A POS
Antibody Screen: NEGATIVE
Unit division: 0
Unit division: 0
Unit division: 0
Unit division: 0
Unit division: 0

## 2023-12-19 LAB — GLUCOSE, CAPILLARY
Glucose-Capillary: 123 mg/dL — ABNORMAL HIGH (ref 70–99)
Glucose-Capillary: 91 mg/dL (ref 70–99)
Glucose-Capillary: 123 mg/dL — ABNORMAL HIGH (ref 70–99)
Glucose-Capillary: 110 mg/dL — ABNORMAL HIGH (ref 70–99)
Glucose-Capillary: 48 mg/dL — ABNORMAL LOW (ref 70–99)
Glucose-Capillary: 96 mg/dL (ref 70–99)

## 2023-12-19 LAB — HEMOGLOBIN AND HEMATOCRIT, BLOOD
HCT: 24.5 % — ABNORMAL LOW (ref 39.0–52.0)
HCT: 26.4 % — ABNORMAL LOW (ref 39.0–52.0)
Hemoglobin: 7.9 g/dL — ABNORMAL LOW (ref 13.0–17.0)
Hemoglobin: 8.7 g/dL — ABNORMAL LOW (ref 13.0–17.0)

## 2023-12-19 LAB — CBC
HCT: 26.2 % — ABNORMAL LOW (ref 39.0–52.0)
Hemoglobin: 8.6 g/dL — ABNORMAL LOW (ref 13.0–17.0)
MCH: 29.7 pg (ref 26.0–34.0)
MCHC: 32.8 g/dL (ref 30.0–36.0)
MCV: 90.3 fL (ref 80.0–100.0)
Platelets: 133 10*3/uL — ABNORMAL LOW (ref 150–400)
RBC: 2.9 MIL/uL — ABNORMAL LOW (ref 4.22–5.81)
RDW: 18.1 % — ABNORMAL HIGH (ref 11.5–15.5)
WBC: 6.5 10*3/uL (ref 4.0–10.5)
nRBC: 0 % (ref 0.0–0.2)

## 2023-12-19 LAB — BASIC METABOLIC PANEL WITH GFR
Anion gap: 11 (ref 5–15)
BUN: 24 mg/dL — ABNORMAL HIGH (ref 8–23)
CO2: 26 mmol/L (ref 22–32)
Calcium: 8.4 mg/dL — ABNORMAL LOW (ref 8.9–10.3)
Chloride: 100 mmol/L (ref 98–111)
Creatinine, Ser: 0.51 mg/dL — ABNORMAL LOW (ref 0.61–1.24)
GFR, Estimated: >60 mL/min (ref >60)
Glucose, Bld: 110 mg/dL — ABNORMAL HIGH (ref 70–99)
Potassium: 3.9 mmol/L (ref 3.5–5.1)
Sodium: 137 mmol/L (ref 135–145)

## 2023-12-19 LAB — CORTISOL-AM, BLOOD: Cortisol - AM: 10.6 ug/dL (ref 6.7–22.6)

## 2023-12-19 LAB — MAGNESIUM: Magnesium: 1.7 mg/dL (ref 1.7–2.4)

## 2023-12-19 LAB — PHOSPHORUS: Phosphorus: 3.3 mg/dL (ref 2.5–4.6)

## 2023-12-19 MED ORDER — LIDOCAINE 5 % EX PTCH
1.0000 | MEDICATED_PATCH | CUTANEOUS | Status: DC
  Administered 2023-12-21 – 2024-02-28 (×65): 1 via TRANSDERMAL
  Filled 2023-12-19 (×70): qty 1

## 2023-12-19 MED ORDER — BREXPIPRAZOLE 0.25 MG PO TABS
0.5000 mg | ORAL_TABLET | Freq: Every day | ORAL | Status: DC
  Administered 2023-12-19: 0.5 mg
  Filled 2023-12-19 (×2): qty 2

## 2023-12-19 MED ORDER — AMIODARONE HCL 200 MG PO TABS: 200.0000 mg | ORAL_TABLET | Freq: Two times a day (BID) | ORAL | Status: DC

## 2023-12-19 MED ORDER — AMIODARONE HCL 200 MG PO TABS
400.0000 mg | ORAL_TABLET | Freq: Two times a day (BID) | ORAL | Status: DC
  Administered 2023-12-19 – 2023-12-20 (×3): 400 mg
  Filled 2023-12-19 (×3): qty 2

## 2023-12-19 MED ORDER — AMIODARONE HCL IN DEXTROSE 360-4.14 MG/200ML-% IV SOLN
INTRAVENOUS | Status: AC
  Filled 2023-12-19: qty 200

## 2023-12-19 MED ORDER — BREXPIPRAZOLE 2 MG PO TABS: 2.0000 mg | ORAL_TABLET | Freq: Every day | ORAL | Status: DC

## 2023-12-19 MED ORDER — AMIODARONE PEDIATRIC ORAL SUSPENSION 5 MG/ML: 400.0000 mg | Freq: Two times a day (BID) | ORAL | Status: DC

## 2023-12-19 MED ORDER — AMIODARONE PEDIATRIC ORAL SUSPENSION 5 MG/ML: 200.0000 mg | Freq: Two times a day (BID) | ORAL | Status: DC

## 2023-12-19 MED ORDER — BREXPIPRAZOLE 1 MG PO TABS: 1.0000 mg | ORAL_TABLET | Freq: Every day | ORAL | Status: DC

## 2023-12-19 MED ORDER — OLANZAPINE 5 MG PO TBDP
5.0000 mg | ORAL_TABLET | Freq: Two times a day (BID) | ORAL | Status: DC | PRN
  Administered 2024-01-05 – 2024-01-07 (×3): 5 mg via ORAL
  Filled 2023-12-19 (×6): qty 1

## 2023-12-19 MED ORDER — DEXTROSE 50 % IV SOLN
INTRAVENOUS | Status: AC
  Administered 2023-12-19: 50 mL
  Filled 2023-12-19: qty 50

## 2023-12-19 NOTE — Progress Notes (Signed)
 Daily Progress Note   Patient Name: Anthony A Pestka Jr.       Date: 12/19/2023 DOB: [birth year]  Age: 68 y.o. MRN#: [id number] Attending Physician: Josiah Nigh, MD Primary Care Physician: Juvenal Opoka Admit Date: 12/07/2023  Reason for Consultation/Follow-up: Establishing goals of care  Length of Stay: 11  Current Medications: Scheduled Meds:   sodium chloride    Intravenous Once   [START ON 12/26/2023] amiodarone   200 mg Per Tube BID   amiodarone   400 mg Per Tube BID   brexpiprazole  0.5 mg Oral QHS   [START ON 12/26/2023] brexpiprazole  1 mg Oral QHS   [START ON 01/02/2024] brexpiprazole  2 mg Oral QHS   Chlorhexidine  Gluconate Cloth  6 each Topical Daily   feeding supplement (PROSource TF20)  60 mL Per Tube Daily   folic acid   1 mg Per Tube Daily   Gerhardt's butt cream   Topical BID   lactulose   10 g Per Tube Daily   lidocaine   1 patch Transdermal Q24H   mouth rinse  15 mL Mouth Rinse 4 times per day   pantoprazole  (PROTONIX ) IV  40 mg Intravenous Daily   sodium chloride  flush  10-40 mL Intracatheter Q12H   spironolactone   12.5 mg Oral Daily   thiamine   100 mg Per Tube Daily   Or   thiamine   100 mg Intravenous Daily    Continuous Infusions:  amiodarone      dextrose  10 mL/hr at 12/19/23 0700   feeding supplement (VITAL 1.5 CAL) 55 mL/hr at 12/19/23 0700   norepinephrine  (LEVOPHED ) Adult infusion Stopped (12/11/23 1217)    PRN Meds: acetaminophen , amiodarone , OLANZapine  zydis, sodium chloride  flush  Physical Exam Vitals reviewed.  Constitutional:      General: He is sleeping. He is not in acute distress. Cardiovascular:     Rate and Rhythm: Normal rate.  Pulmonary:     Effort: Tachypnea present.             Vital Signs: BP 121/79   Pulse 82   Temp 100 F (37.8 C)  (Axillary)   Resp (!) 23   Ht 5\' 8"  (1.727 m)   Wt 68.6 kg   SpO2 100%   BMI 23.00 kg/m  SpO2: SpO2: 100 % O2 Device: O2 Device: Room Air O2 Flow Rate: O2 Flow  Rate (L/min): 4 L/min   Most recent weight: Weight: 68.6 kg     Patient Active Problem List   Diagnosis Date Noted   Acute delirium 12/08/2023   Agitation 07/29/2023   History of CVA (cerebrovascular accident) 07/11/2023   Acute pulmonary embolism (HCC) 07/08/2023   Lactic acidosis 07/08/2023   Rib fractures 02/04/2023   Hypoglycemia 02/04/2023   Pulmonary embolus (HCC) 02/04/2023   Pulmonary embolism (HCC) 02/03/2023   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 difficile colitis 09/11/2022   Aspiration pneumonia (HCC) 08/27/2022   Aortic atherosclerosis (HCC) 08/27/2022   Depression, unspecified 08/09/2022   Hyponatremia 06/01/2022   Non compliance w medication regimen 05/31/2022   Delirium 05/25/2022   Stricture and stenosis of esophagus 05/21/2022   Gastritis and gastroduodenitis 05/21/2022   Loose stools 05/20/2022   Malnutrition of moderate degree 05/20/2022   Acute on chronic heart failure (HCC)    Leukocytosis 05/19/2022   Sepsis (HCC) 05/19/2022   Cellulitis of left leg, possible 05/19/2022   Moniliasis, interdigital 05/19/2022   Open toe wound, right foot, medial fourth digit 05/19/2022   Malnutrition (HCC) 05/19/2022   Alcohol  abuse 03/24/2022   Dyslipidemia 03/24/2022   Acute on chronic diastolic CHF (congestive heart failure) (HCC) 03/24/2022   Acute respiratory failure with hypoxia (HCC) 03/23/2022   Generalized weakness    Atrial fibrillation with rapid ventricular response (HCC) 03/15/2022    Hypokalemia 03/15/2022   Decreased functional mobility and endurance 06/22/2021   Iron  deficiency anemia 10/16/2020   Cough    Hypomagnesemia    Protein-calorie malnutrition, severe 08/22/2020   Alcohol  use disorder, severe, in controlled environment (HCC)    Pressure injury of skin 08/18/2020   Urinary tract infection without hematuria    Severe sepsis (HCC) 08/14/2020   Paroxysmal atrial fibrillation (HCC) 08/13/2020   Rhabdomyolysis 07/22/2020   Cerebral ventriculomegaly 07/22/2020   Hemoglobin decreased 07/22/2020   History of delirium tremons 07/22/2020   Hypoalbuminemia due to protein-calorie malnutrition (HCC) 07/22/2020   Tobacco use disorder 07/22/2020   Alcohol  withdrawal delirium (HCC)    Dehydration    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) 07/18/2018   Depression 05/26/2018   Anxiety state 04/25/2018   Need for immunization against influenza 04/25/2018   Alcohol  withdrawal (HCC) 09/01/2017   DOE (dyspnea on exertion) 11/27/2016   Actinic keratosis 11/27/2016   Macrocytic anemia 11/23/2016   History of pulmonary embolus (PE) 11/02/2016   Hiatal hernia 09/14/2016   Skin cancer 08/13/2016   Poor social situation 06/09/2013   Tinea versicolor 06/08/2013   Back pain 05/07/2013   Seborrheic dermatitis of scalp 11/08/2012   HTN (hypertension) 04/11/2012   Alcohol  use disorder, severe, dependence (HCC) 09/16/2006    Palliative Care Assessment & Plan   Patient Profile: 69 y.o. male  with past medical history of HTN, HFmrEF, aortic atherosclerosis, PAF, PE (previously on Eliquis ), EtOH abuse c/b withdrawal/DTs, tobacco abuse, hiatal hernia (s/p Nissen 2016), chronic low back pain, anxiety admitted on 12/07/2023 with shortness of breath.    The day before the patient had presented to ED requesting EtOH detox; patient was deemed to be not actively in EtOH withdrawal and was discharged, however upon discharge patient threatened staff and was escorted off of  the premises  by [employer].    On ED arrival, patient was afebrile, HR 92, BP 139/117, RR 30, SpO2 90%. Labs were notable for WBC 3.2, Hgb 7.5, Plt 220. Na 139, K 3.7, CO2 14, Cr 0.72. Glucose 61. AST 74, ALT 28, Alk Phos 112, Tbili 0.8. Lipase WNL. Ethanol 57 (221). TSH WNL. Head with chronic atrophic changes without acute abnormality. CT A/P without acute process noted. RUQ US  with GB sludge, +GB wall thickening/edema, no stones or ductal dilatation. CIWA protocol initiated. Admitted by TRH for EtOH withdrawal, delirium and hypoglycemia.   Today's Discussion: Reviewed patient's chart and received updates from nursing and attending provider. Patient is more alert today but remains disoriented. Patient was seen by psychiatry today. Plan is for patient to transfer from ICU today. Neurology consult has been placed.  11:30 am: Spoke to patient's brother [name] Tussey. Updated brother on care plan. [name] is glad his brother is being transferred out of the ICU. He states his brother has had many near death experiences over the years. His usual pattern is he is hospitalized, optimized, goes back to living on the streets and quickly struggles to maintain his health. The patient cannot be consistent with his medications because they are often lost or stolen. He shared the patient also has a history of being physically and verbally aggressive which often complicates his living situations. The patient often sleeps on buses and uses the [employer] to shower.   [name] has tried to encourage Markeem to go to skilled nursing facility in the past when it has been recommended at discharge. When the patient had capacity to make that decision himself he declined.  He has a fear that he will be held there against his will.  During our conversation today, [name] seems more open to the idea of the patient going to SNF if [name] is the decision-maker.  He questions whether the stability and supervision provided at a SNF would improve the patient's status  in general wellness and mental status. I encourage him to also consider the patient's quality of life and what would be acceptable to him in the future. PMT will follow up after neurology consult. Encouraged [name] to call PMT with questions or concerns,  Recommendations/Plan: DNR Full scope Time for outcomes PMT will continue to support    Code Status:    Code Status Orders  (From admission, onward)           Start     Ordered   12/17/23 1318  Do not attempt resuscitation (DNR) Pre-Arrest Interventions Desired  (Code Status)  Continuous       Question Answer Comment  If pulseless and not breathing No CPR or chest compressions.   In Pre-Arrest Conditions (Patient Has Pulse and Is Breathing) May intubate, use advanced airway interventions and cardioversion/ACLS medications if appropriate or indicated. May transfer to ICU.   Consent: Discussion documented in EHR or advanced directives reviewed      12/17/23 1317        Extensive chart review has been completed prior to seeing the patient including labs, vital signs, imaging, progress/consult notes, orders, medications, and available advance directive documents.  Care plan was discussed with bedside RN and Dr. Felipe Horton  Time spent: 40 minutes  Thank you for allowing the Palliative Medicine Team to assist in the care of this patient.    Daina Drum, NP  Please contact Palliative Medicine Team phone at [phone] for questions and concerns.
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
## 2023-12-19 NOTE — Progress Notes (Signed)
 eLink Physician-Brief Progress Note Patient Name: Anthony A Loera Jr. DOB: 1956/02/28 MRN: [id number]   Date of Service  12/19/2023  HPI/Events of Note  Patient switched from amio gtt to PO. Was in NSR. Just converted to A fib rate controlled. 95-100's. BP 105/76 (85)   eICU Interventions  No significant abnormality from this morning's labs Continue PO amiodarone  Will defer the need for anticoagulation to bedside rounding team. Previously on apixaban .     Intervention Category Intermediate Interventions: Arrhythmia - evaluation and management  [name] 12/19/2023, 8:48 PM

## 2023-12-19 NOTE — Consult Note (Signed)
 Patient is 68 year old man who presented to [facility] ED on 5/20 for SOB. He has psychiatric history of EtOH abuse c/b withdrawal/DTs,and PMHx significant for HTN, aortic atherosclerosis, PAF, PE (previously on Eliquis ),  tobacco abuse, hiatal hernia (s/p Nissen 2016), chronic low back pain, anxiety. Patient is also unhoused. Recent presentation to ED 5/19 requesting EtOH detox; patient was deemed to be not actively in EtOH withdrawal and was discharged, however upon discharge patient threatened staff and was escorted off of the premises by [employer]. Patient is currently awaiting bed placement,and has been experiencing frequent agitation requiring Ativan  administration per CIWA and soft wrist restraints to prevent harm.Today, psychiatry was consulted for "Help with figuring out mood stabilizing regimen and if he is safe for dc as he does not want a snf."  This writer went to the patient room twice. He was being attended to by PT/OT during the initial encounter. On the second encounter, patient was seen lying in bed, drowsy and sedated and unable to fully participate in interview. When asked why he does not want to go to SNF, patient states he does not understand what a skilled nursing facility means and no one has ever explained to him. This Clinical research associate informed patient that skilled nursing facility is a form of rehabilitation center where series of professionals like nurses, physical therapist, and doctors provide short-term, round the clock treatment for patients who require more intensive care that cannot be provided in an ordinary nursing facility. However, patient slept off in the process of explaining SNF to him, and was unable to participate in further discussion. Interview was terminated as a result.   Collateral information from the treating nurse indicates that patient has been experiencing frequent agitation requiring intramuscular medications. He last received IM haldol  last night due to agitation. Of note, patient  has history of mild-moderate Neurocognitive disorder and is not currently on any cholinesterase inhibitor or other medications for Dementia. The frequent agitation towards the end of the day is likely due to "sundowning" due to underlying Neurocognitive impairment. Note,  Qtc on 12/14/2023 was 517 (significantly elevated).   Plan/Recommendation --Start Rexulti 0.5 mg at bedtime for 7 days, increase to 1 mg at bedtime after 1 week, and increase to target dose of 2 mg at bedtime thereafter for agitation. This medication is FDA approved for agitation in Dementia patient with no risk of Qtc prolongation.  --Consider Olanzapine  5 mg PO/IM  Q12HR PRN for agitation. Less risk of Qtc prolongation. --Please discontinue Haldol  due to risk of EPS and further worsening of prolonged Qtc.  --Please consider consult to Neurology for possible treatment of Neurocognitive impairment.  --Psychiatry consult service will sign off at this time. Please re consult as needed. Thank you for this consult.   Disposition: There is no psychiatric contraindication for discharge at this time. However, patient needs further education and orientation regarding the benefit of discharge to SNF.   Terrell Ferris, MD Attending psychiatrist.

## 2023-12-19 NOTE — Evaluation (Signed)
 Physical Therapy Evaluation Patient Details Name: Anthony A Buda Jr. MRN: [id number] DOB: 02/06/1956 Today's Date: 12/19/2023  History of Present Illness  Pt is a 68 y.o. male who presented 12/06/23 with SOB. Shortly after arriving, pt became agitated and was given IV Ativan . CT head is unremarkable. Patient admitted for EtOH withdrawal, acute delirium, and hypoglycemia. Cortrak placed 5/23.  The day before the patient had presented to ED requesting EtOH detox; patient was deemed to be not actively in EtOH withdrawal and was discharged, however upon discharge patient threatened staff and was escorted off of the premises by [employer]. PMH includes alcohol  abuse, HTN, CVA, HFmrEF, afib, PE, HLD, anxiety, depression, chronic low back pain.   Clinical Impression  Pt presents with condition above and deficits mentioned below, see PT Problem List. PTA, per chart, he was homeless. He was independent without AD per pt. The pt is currently a poor historian and demonstrates deficits in sequencing, awareness, problem solving, memory, attention, and orientation. He also displays deficits in generalized strength, balance, power, and activity tolerance and is at high risk for falls. He tends to lean laterally to his R and needs modA for all functional mobility, including ambulating up to ~20 ft with HHA. Pending pt's functional progress and d/c location and support options, he may benefit from intensive inpatient rehab, > 3 hours/day, with a potential goal to return to being independent. Will continue to follow acutely and re-assess d/c needs as appropriate.      If plan is discharge home, recommend the following: A lot of help with walking and/or transfers;A lot of help with bathing/dressing/bathroom;Assistance with cooking/housework;Direct supervision/assist for medications management;Direct supervision/assist for financial management;Assist for transportation;Help with stairs or ramp for entrance;Supervision due to cognitive  status   Can travel by private vehicle        Equipment Recommendations Other (comment) (TBA further)  Recommendations for Other Services  Rehab consult    Functional Status Assessment Patient has had a recent decline in their functional status and demonstrates the ability to make significant improvements in function in a reasonable and predictable amount of time.     Precautions / Restrictions Precautions Precautions: Fall;Other (comment) Recall of Precautions/Restrictions: Impaired Precaution/Restrictions Comments: bil mittens; cortrak Restrictions Weight Bearing Restrictions Per Provider Order: No      Mobility  Bed Mobility Overal bed mobility: Needs Assistance Bed Mobility: Supine to Sit, Sit to Supine     Supine to sit: Mod assist, HOB elevated Sit to supine: Mod assist   General bed mobility comments: ModA needed to manage trunk and legs with supine <> sit transition L EOB    Transfers Overall transfer level: Needs assistance Equipment used: 1 person hand held assist Transfers: Sit to/from Stand Sit to Stand: Mod assist           General transfer comment: ModA to power pt up to stand from EOB with HHA. Pt tends to lean his legs back against the bed for support.    Ambulation/Gait Ambulation/Gait assistance: Mod assist Gait Distance (Feet): 20 Feet Assistive device: 1 person hand held assist Gait Pattern/deviations: Step-to pattern, Decreased step length - right, Decreased step length - left, Decreased stride length, Trunk flexed, Shuffle, Decreased weight shift to right, Decreased weight shift to left Gait velocity: reduced Gait velocity interpretation: <1.31 ft/sec, indicative of household ambulator   General Gait Details: Pt ambulates with anterior and R lateral trunk flexion and short, slow, shuffling steps with HHA and modA for balance and cuing pt for weight  shifting to progress steps.  Stairs            Wheelchair Mobility     Tilt  Bed    Modified Rankin (Stroke Patients Only) Modified Rankin (Stroke Patients Only) Pre-Morbid Rankin Score: No symptoms Modified Rankin: Moderately severe disability     Balance Overall balance assessment: Needs assistance Sitting-balance support: No upper extremity supported, Feet supported Sitting balance-Leahy Scale: Fair Sitting balance - Comments: static sitting EOB with CGA-minA for safety, R lateral lean noted Postural control: Right lateral lean Standing balance support: Bilateral upper extremity supported, During functional activity Standing balance-Leahy Scale: Poor Standing balance comment: R lateral lean, min-modA with HHA for standing balance                             Pertinent Vitals/Pain Pain Assessment Pain Assessment: Faces Faces Pain Scale: No hurt Pain Intervention(s): Monitored during session    Home Living Family/patient expects to be discharged to:: Shelter/Homeless                   Additional Comments: Pt unable to report, per chart, pt is homeless    Prior Function Prior Level of Function : Independent/Modified Independent;Patient poor historian/Family not available             Mobility Comments: No AD per pt       Extremity/Trunk Assessment   Upper Extremity Assessment Upper Extremity Assessment: Defer to OT evaluation    Lower Extremity Assessment Lower Extremity Assessment: Generalized weakness;RLE deficits/detail;LLE deficits/detail RLE Deficits / Details: grossly 4 to 4+ bil; reports decreased sensation in feet bil RLE Sensation: decreased light touch LLE Deficits / Details: grossly 4 to 4+ bil; reports decreased sensation in feet bil LLE Sensation: decreased light touch    Cervical / Trunk Assessment Cervical / Trunk Assessment: Kyphotic (R lateral trunk flexion)  Communication   Communication Communication: Impaired Factors Affecting Communication: Reduced clarity of speech    Cognition Arousal:  Alert, Lethargic (able to remain awake when upright, lethargic when supine) Behavior During Therapy: WFL for tasks assessed/performed   PT - Cognitive impairments: Orientation, Awareness, Memory, Attention, Initiation, Sequencing, Problem solving, Safety/Judgement   Orientation impairments: Situation, Place (did not ask time)                   PT - Cognition Comments: Pt thought he was at a court house initially but able to recall hospital later in session after being corrected earlier. Slow to process and respond to all cues. Poor initiation. Lethargic when supine. Following commands: Impaired Following commands impaired: Follows one step commands inconsistently, Follows one step commands with increased time     Cueing Cueing Techniques: Verbal cues, Tactile cues, Gestural cues, Visual cues     General Comments General comments (skin integrity, edema, etc.): VSS on RA    Exercises     Assessment/Plan    PT Assessment Patient needs continued PT services  PT Problem List Decreased strength;Decreased activity tolerance;Decreased balance;Decreased mobility;Decreased cognition;Decreased coordination       PT Treatment Interventions DME instruction;Gait training;Stair training;Functional mobility training;Therapeutic exercise;Balance training;Therapeutic activities;Neuromuscular re-education;Cognitive remediation;Patient/family education    PT Goals (Current goals can be found in the Care Plan section)  Acute Rehab PT Goals Patient Stated Goal: agreeable to session, wanted to rest at the end PT Goal Formulation: With patient Time For Goal Achievement: 01/02/24 Potential to Achieve Goals: Good    Frequency Min 3X/week  Co-evaluation               AM-PAC PT "6 Clicks" Mobility  Outcome Measure Help needed turning from your back to your side while in a flat bed without using bedrails?: A Oler Help needed moving from lying on your back to sitting on the side of  a flat bed without using bedrails?: A Lot Help needed moving to and from a bed to a chair (including a wheelchair)?: A Lot Help needed standing up from a chair using your arms (e.g., wheelchair or bedside chair)?: A Lot Help needed to walk in hospital room?: A Lot Help needed climbing 3-5 steps with a railing? : Total 6 Click Score: 12    End of Session   Activity Tolerance: Patient limited by lethargy;Patient tolerated treatment well Patient left: in bed;with call bell/phone within reach;with bed alarm set;with nursing/sitter in room (RN reports no need for mittens) Nurse Communication: Mobility status PT Visit Diagnosis: Unsteadiness on feet (R26.81);Other abnormalities of gait and mobility (R26.89);Muscle weakness (generalized) (M62.81);Difficulty in walking, not elsewhere classified (R26.2)    Time: 1610-9604 PT Time Calculation (min) (ACUTE ONLY): 15 min   Charges:   PT Evaluation $PT Eval Moderate Complexity: 1 Mod   PT General Charges $$ ACUTE PT VISIT: 1 Visit         Vernida Goodie, PT, DPT Acute Rehabilitation Services  Office: [phone]   Ellyn Hack 12/19/2023, 1:32 PM
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
## 2023-12-20 ENCOUNTER — Inpatient Hospital Stay (HOSPITAL_COMMUNITY)

## 2023-12-20 DIAGNOSIS — E162 Hypoglycemia, unspecified: Secondary | ICD-10-CM | POA: Diagnosis not present

## 2023-12-20 LAB — GLUCOSE, CAPILLARY
Glucose-Capillary: 105 mg/dL — ABNORMAL HIGH (ref 70–99)
Glucose-Capillary: 122 mg/dL — ABNORMAL HIGH (ref 70–99)
Glucose-Capillary: 68 mg/dL — ABNORMAL LOW (ref 70–99)
Glucose-Capillary: 78 mg/dL (ref 70–99)
Glucose-Capillary: 79 mg/dL (ref 70–99)
Glucose-Capillary: 88 mg/dL (ref 70–99)
Glucose-Capillary: 99 mg/dL (ref 70–99)

## 2023-12-20 LAB — PHOSPHORUS: Phosphorus: 3.6 mg/dL (ref 2.5–4.6)

## 2023-12-20 LAB — CBC
HCT: 26.9 % — ABNORMAL LOW (ref 39.0–52.0)
Hemoglobin: 8.6 g/dL — ABNORMAL LOW (ref 13.0–17.0)
MCH: 29.3 pg (ref 26.0–34.0)
MCHC: 32 g/dL (ref 30.0–36.0)
MCV: 91.5 fL (ref 80.0–100.0)
Platelets: 167 10*3/uL (ref 150–400)
RBC: 2.94 MIL/uL — ABNORMAL LOW (ref 4.22–5.81)
RDW: 18 % — ABNORMAL HIGH (ref 11.5–15.5)
WBC: 6.4 10*3/uL (ref 4.0–10.5)
nRBC: 0 % (ref 0.0–0.2)

## 2023-12-20 LAB — COMPREHENSIVE METABOLIC PANEL WITH GFR
ALT: 77 U/L — ABNORMAL HIGH (ref 0–44)
AST: 75 U/L — ABNORMAL HIGH (ref 15–41)
Albumin: 2.9 g/dL — ABNORMAL LOW (ref 3.5–5.0)
Alkaline Phosphatase: 114 U/L (ref 38–126)
Anion gap: 6 (ref 5–15)
BUN: 20 mg/dL (ref 8–23)
CO2: 24 mmol/L (ref 22–32)
Calcium: 8.3 mg/dL — ABNORMAL LOW (ref 8.9–10.3)
Chloride: 101 mmol/L (ref 98–111)
Creatinine, Ser: 0.54 mg/dL — ABNORMAL LOW (ref 0.61–1.24)
GFR, Estimated: >60 mL/min (ref >60)
Glucose, Bld: 86 mg/dL (ref 70–99)
Potassium: 4.5 mmol/L (ref 3.5–5.1)
Sodium: 131 mmol/L — ABNORMAL LOW (ref 135–145)
Total Bilirubin: 2.5 mg/dL — ABNORMAL HIGH (ref 0.0–1.2)
Total Protein: 6.4 g/dL — ABNORMAL LOW (ref 6.5–8.1)

## 2023-12-20 LAB — RENAL FUNCTION PANEL
Albumin: 2.9 g/dL — ABNORMAL LOW (ref 3.5–5.0)
Anion gap: 5 (ref 5–15)
BUN: 20 mg/dL (ref 8–23)
CO2: 24 mmol/L (ref 22–32)
Calcium: 8.3 mg/dL — ABNORMAL LOW (ref 8.9–10.3)
Chloride: 101 mmol/L (ref 98–111)
Creatinine, Ser: 0.49 mg/dL — ABNORMAL LOW (ref 0.61–1.24)
GFR, Estimated: >60 mL/min (ref >60)
Glucose, Bld: 86 mg/dL (ref 70–99)
Phosphorus: 3.5 mg/dL (ref 2.5–4.6)
Potassium: 4.5 mmol/L (ref 3.5–5.1)
Sodium: 130 mmol/L — ABNORMAL LOW (ref 135–145)

## 2023-12-20 LAB — MAGNESIUM: Magnesium: 1.7 mg/dL (ref 1.7–2.4)

## 2023-12-20 MED ORDER — AMIODARONE HCL 200 MG PO TABS
200.0000 mg | ORAL_TABLET | Freq: Two times a day (BID) | ORAL | Status: DC
  Administered 2023-12-26 – 2024-02-28 (×129): 200 mg via ORAL
  Filled 2023-12-20 (×129): qty 1

## 2023-12-20 MED ORDER — ACETAMINOPHEN 325 MG PO TABS
650.0000 mg | ORAL_TABLET | Freq: Four times a day (QID) | ORAL | Status: DC | PRN
  Administered 2023-12-20: 650 mg
  Filled 2023-12-20: qty 2

## 2023-12-20 MED ORDER — AMIODARONE HCL 200 MG PO TABS
400.0000 mg | ORAL_TABLET | Freq: Two times a day (BID) | ORAL | Status: AC
  Administered 2023-12-20 – 2023-12-25 (×11): 400 mg via ORAL
  Filled 2023-12-20 (×11): qty 2

## 2023-12-20 MED ORDER — THIAMINE HCL 100 MG/ML IJ SOLN: 100.0000 mg | Freq: Every day | INTRAMUSCULAR | Status: DC

## 2023-12-20 MED ORDER — FOLIC ACID 1 MG PO TABS
1.0000 mg | ORAL_TABLET | Freq: Every day | ORAL | Status: DC
  Administered 2023-12-21 – 2024-01-05 (×16): 1 mg via ORAL
  Filled 2023-12-20 (×16): qty 1

## 2023-12-20 MED ORDER — ACETAMINOPHEN 325 MG PO TABS
650.0000 mg | ORAL_TABLET | Freq: Four times a day (QID) | ORAL | Status: DC | PRN
  Administered 2023-12-22 – 2024-02-28 (×115): 650 mg via ORAL
  Filled 2023-12-20 (×116): qty 2

## 2023-12-20 MED ORDER — LACTULOSE 10 GM/15ML PO SOLN
10.0000 g | Freq: Every day | ORAL | Status: DC
  Administered 2023-12-21: 10 g via ORAL
  Filled 2023-12-20: qty 15

## 2023-12-20 MED ORDER — BREXPIPRAZOLE 2 MG PO TABS: 2.0000 mg | ORAL_TABLET | Freq: Every day | ORAL | Status: DC

## 2023-12-20 MED ORDER — ACETAMINOPHEN 325 MG PO TABS: 650.0000 mg | ORAL_TABLET | Freq: Four times a day (QID) | ORAL | Status: DC | PRN

## 2023-12-20 MED ORDER — THIAMINE MONONITRATE 100 MG PO TABS
100.0000 mg | ORAL_TABLET | Freq: Every day | ORAL | Status: DC
  Administered 2023-12-21 – 2024-01-05 (×16): 100 mg via ORAL
  Filled 2023-12-20 (×16): qty 1

## 2023-12-20 MED ORDER — BREXPIPRAZOLE 1 MG PO TABS: 1.0000 mg | ORAL_TABLET | Freq: Every day | ORAL | Status: DC

## 2023-12-20 MED ORDER — BREXPIPRAZOLE 0.25 MG PO TABS
0.5000 mg | ORAL_TABLET | Freq: Every day | ORAL | Status: DC
  Administered 2023-12-20: 0.5 mg via ORAL
  Filled 2023-12-20 (×2): qty 2

## 2023-12-20 NOTE — Progress Notes (Signed)
 Daily Progress Note   Patient Name: Anthony A Vitanza Jr.       Date: 12/20/2023 DOB: 1956/06/18  Age: 68 y.o. MRN#: [id number] Attending Physician: Janeane Mealy, MD Primary Care Physician: Pcp, No Admit Date: 12/07/2023  Reason for Consultation/Follow-up: Establishing goals of care  Length of Stay: 12  Current Medications: Scheduled Meds:   sodium chloride    Intravenous Once   [START ON 12/26/2023] amiodarone   200 mg Per Tube BID   amiodarone   400 mg Per Tube BID   brexpiprazole  0.5 mg Per Tube QHS   [START ON 12/26/2023] brexpiprazole  1 mg Per Tube QHS   [START ON 01/02/2024] brexpiprazole  2 mg Per Tube QHS   Chlorhexidine  Gluconate Cloth  6 each Topical Daily   feeding supplement (PROSource TF20)  60 mL Per Tube Daily   folic acid   1 mg Per Tube Daily   Gerhardt's butt cream   Topical BID   lactulose   10 g Per Tube Daily   lidocaine   1 patch Transdermal Q24H   mouth rinse  15 mL Mouth Rinse 4 times per day   pantoprazole  (PROTONIX ) IV  40 mg Intravenous Daily   sodium chloride  flush  10-40 mL Intracatheter Q12H   spironolactone   12.5 mg Oral Daily   thiamine   100 mg Per Tube Daily   Or   thiamine   100 mg Intravenous Daily    Continuous Infusions:  dextrose  10 mL/hr at 12/20/23 1200   feeding supplement (VITAL 1.5 CAL) 55 mL/hr at 12/20/23 1200    PRN Meds: acetaminophen , OLANZapine  zydis, sodium chloride  flush  Physical Exam Vitals reviewed.  Constitutional:      General: He is awake. He is not in acute distress. Cardiovascular:     Rate and Rhythm: Normal rate.  Pulmonary:     Effort: Pulmonary effort is normal. Tachypnea present.  Skin:    General: Skin is warm and dry.  Neurological:     Comments: He is oriented to person, place, and situation at times but  remains a Omar confused  Psychiatric:        Behavior: Behavior normal.             Vital Signs: BP (!) 148/98   Pulse 91   Temp 97.8 F (36.6 C) (Axillary)   Resp Aaron Aas)  22   Ht 5\' 8"  (1.727 m)   Wt 68.7 kg   SpO2 100%   BMI 23.03 kg/m  SpO2: SpO2: 100 % O2 Device: O2 Device: Room Air O2 Flow Rate: O2 Flow Rate (L/min): 4 L/min   Most recent weight: Weight: 68.7 kg     Patient Active Problem List   Diagnosis Date Noted   Acute delirium 12/08/2023   Agitation 07/29/2023   History of CVA (cerebrovascular accident) 07/11/2023   Acute pulmonary embolism (HCC) 07/08/2023   Lactic acidosis 07/08/2023   Rib fractures 02/04/2023   Hypoglycemia 02/04/2023   Pulmonary embolus (HCC) 02/04/2023   Pulmonary embolism (HCC) 02/03/2023   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 difficile colitis 09/11/2022   Aspiration pneumonia (HCC) 08/27/2022   Aortic atherosclerosis (HCC) 08/27/2022   Depression, unspecified 08/09/2022   Hyponatremia 06/01/2022   Non compliance w medication regimen 05/31/2022   Delirium 05/25/2022   Stricture and stenosis of esophagus 05/21/2022   Gastritis and gastroduodenitis 05/21/2022   Loose stools 05/20/2022   Malnutrition of moderate degree 05/20/2022   Acute on chronic heart failure (HCC)    Leukocytosis 05/19/2022   Sepsis (HCC) 05/19/2022   Cellulitis of left leg, possible 05/19/2022   Moniliasis, interdigital 05/19/2022   Open toe wound, right foot, medial fourth digit 05/19/2022   Malnutrition (HCC) 05/19/2022   Alcohol  abuse 03/24/2022   Dyslipidemia 03/24/2022   Acute on chronic diastolic CHF (congestive heart failure) (HCC) 03/24/2022   Acute  respiratory failure with hypoxia (HCC) 03/23/2022   Generalized weakness    Atrial fibrillation with rapid ventricular response (HCC) 03/15/2022   Hypokalemia 03/15/2022   Decreased functional mobility and endurance 06/22/2021   Iron  deficiency anemia 10/16/2020   Cough    Hypomagnesemia    Protein-calorie malnutrition, severe 08/22/2020   Alcohol  use disorder, severe, in controlled environment (HCC)    Pressure injury of skin 08/18/2020   Urinary tract infection without hematuria    Severe sepsis (HCC) 08/14/2020   Paroxysmal atrial fibrillation (HCC) 08/13/2020   Rhabdomyolysis 07/22/2020   Cerebral ventriculomegaly 07/22/2020   Hemoglobin decreased 07/22/2020   History of delirium tremons 07/22/2020   Hypoalbuminemia due to protein-calorie malnutrition (HCC) 07/22/2020   Tobacco use disorder 07/22/2020   Alcohol  withdrawal delirium (HCC)    Dehydration    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) 07/18/2018   Depression 05/26/2018   Anxiety state 04/25/2018   Need for immunization against influenza 04/25/2018   Alcohol  withdrawal (HCC) 09/01/2017   DOE (dyspnea on exertion) 11/27/2016   Actinic keratosis 11/27/2016   Macrocytic anemia 11/23/2016   History of pulmonary embolus (PE) 11/02/2016   Hiatal hernia 09/14/2016   Skin cancer 08/13/2016   Poor social situation 06/09/2013   Tinea versicolor 06/08/2013   Back pain 05/07/2013   Seborrheic dermatitis of scalp 11/08/2012   HTN (hypertension) 04/11/2012   Alcohol  use disorder, severe, dependence (HCC) 09/16/2006    Palliative Care Assessment & Plan   Patient Profile: 68 y.o. male  with past medical history of HTN, HFmrEF, aortic atherosclerosis, PAF, PE (previously on Eliquis ), EtOH abuse c/b withdrawal/DTs, tobacco abuse, hiatal hernia (s/p Nissen 2016), chronic low back pain, anxiety admitted on 12/07/2023 with shortness of breath.    The  day before the patient had presented to ED requesting EtOH detox;  patient was deemed to be not actively in EtOH withdrawal and was discharged, however upon discharge patient threatened staff and was escorted off of the premises by [employer].    On ED arrival, patient was afebrile, HR 92, BP 139/117, RR 30, SpO2 90%. Labs were notable for WBC 3.2, Hgb 7.5, Plt 220. Na 139, K 3.7, CO2 14, Cr 0.72. Glucose 61. AST 74, ALT 28, Alk Phos 112, Tbili 0.8. Lipase WNL. Ethanol 57 (221). TSH WNL. Head with chronic atrophic changes without acute abnormality. CT A/P without acute process noted. RUQ US  with GB sludge, +GB wall thickening/edema, no stones or ductal dilatation. CIWA protocol initiated. Admitted by TRH for EtOH withdrawal, delirium and hypoglycemia.   Today's Discussion: Reviewed patient's chart. Patient is more alert today and is oriented to person, place, and situation for most of our conversation but as the time we talked went on some confusion was noted. His speech is no longer mumbled. Plan is for patient to transfer from ICU today.   Introduced palliative medicine to the patient.  We discussed patient's current hospitalization and his health history.  Patient shared that his brother [name] and sister [name] help him make medical decisions when he is unable to.  The patient shared that he feels different during this hospitalization. He wants to make improvements to his health and take better care of himself.  He shared he is unhoused and an alcoholic and that he is struggled with this for some time.  He shared that he understands he needs and cannot currently live independently.  In the past he has been opposed to nursing facilities because he does not want a roommate and because of cost.  He shared he feels differently now and is open to discharging to a facility.  He shared that he understands he was very sick and tells me he is not afraid of dying.  I shared that I think it would be important as he continues to clear to have goals of care conversations with him and his  siblings so his siblings know his wishes.  We discussed potentially doing this closer to discharge.  Introduced the concept of a MOST form and he is interested in completing this closer to discharge.  PMT will follow-up in a couple days.  1:45 pm. Left vmail for patient's brother [name] Carles. Left callback information and let him know we were hoping we could have a goals of care conversation with Graesyn later this week.  Encouraged patient and family to call PMT with questions or concerns.  Recommendations/Plan: DNR Full scope Time for outcomes PMT will continue to support    Code Status:    Code Status Orders  (From admission, onward)           Start     Ordered   12/17/23 1318  Do not attempt resuscitation (DNR) Pre-Arrest Interventions Desired  (Code Status)  Continuous       Question Answer Comment  If pulseless and not breathing No CPR or chest compressions.   In Pre-Arrest Conditions (Patient Has Pulse and Is Breathing) May intubate, use advanced airway interventions and cardioversion/ACLS medications if appropriate or indicated. May transfer to ICU.   Consent: Discussion documented in EHR or advanced directives reviewed      12/17/23 1317        Extensive chart review has been completed prior to seeing the patient including labs, vital signs, imaging, progress/consult  notes, orders, medications, and available advance directive documents.  Care plan was discussed with Dr. Sari Cunning  Time spent: 65 minutes  Thank you for allowing the Palliative Medicine Team to assist in the care of this patient.    Daina Drum, NP  Please contact Palliative Medicine Team phone at [phone] for questions and concerns.

## 2023-12-20 NOTE — Progress Notes (Signed)
 Inpatient Rehab Admissions Coordinator:   Per therapy notes,  patient was screened for CIR candidacy by Wandalee Gust, MS, CCC-SLP. At this time, Pt. Appears to be a a potential candidate for CIR. I will place order for rehab consult per protocol for full assessment. Note patient is unhoused. I do not think he has the potential to reach independence during CIR stay, so in order to be a candidate, family will need to agree to take him in and provide 24/7 caregiving assist. I think this is unlikely, so while CIR will work Pt. Up, strongly recommend TOC also continue working toward SNF placement. Please contact me any with questions.  Wandalee Gust, MS, CCC-SLP Rehab Admissions Coordinator  [phone] (celll) [phone] (office)

## 2023-12-20 NOTE — Evaluation (Signed)
 Occupational Therapy Evaluation Patient Details Name: Dante A Keatts Jr. MRN: [id number] DOB: 1956-01-27 Today's Date: 12/20/2023   History of Present Illness   Pt is a 68 y.o. male who presented 12/06/23 with SOB. Shortly after arriving, pt became agitated and was given IV Ativan . CT head is unremarkable. Patient admitted for EtOH withdrawal, acute delirium, and hypoglycemia. Cortrak placed 5/23.  The day before the patient had presented to ED requesting EtOH detox; patient was deemed to be not actively in EtOH withdrawal and was discharged, however upon discharge patient threatened staff and was escorted off of the premises by [employer]. PMH includes alcohol  abuse, HTN, CVA, HFmrEF, afib, PE, HLD, anxiety, depression, chronic low back pain.     Clinical Impressions Patient admitted for the diagnosis above.  PTA he was living in a shelter, and reports independence with ADL completion and mobility.  Patient is considering discharge to a local ALF, as he no longer believes he can live alone.  Patient presents with the deficits listed below, and currently needs up to Mod A for simple transfers and up to Kilian A for ADL completion.  Patient does demonstrate the willingness to participate with rehab, and the ability to reach a Mod I level with continued post acute rehab prior to home.       If plan is discharge home, recommend the following:   Assist for transportation;A lot of help with bathing/dressing/bathroom;A lot of help with walking and/or transfers     Functional Status Assessment   Patient has had a recent decline in their functional status and demonstrates the ability to make significant improvements in function in a reasonable and predictable amount of time.     Equipment Recommendations   None recommended by OT     Recommendations for Other Services         Precautions/Restrictions   Precautions Precautions: Fall Precaution/Restrictions Comments: cortrak Restrictions Weight  Bearing Restrictions Per Provider Order: No     Mobility Bed Mobility Overal bed mobility: Needs Assistance Bed Mobility: Supine to Sit, Sit to Supine     Supine to sit: Mod assist, HOB elevated Sit to supine: Mod assist        Transfers Overall transfer level: Needs assistance Equipment used: 1 person hand held assist Transfers: Sit to/from Stand Sit to Stand: Min assist                  Balance Overall balance assessment: Needs assistance Sitting-balance support: No upper extremity supported, Feet supported Sitting balance-Leahy Scale: Fair     Standing balance support: Bilateral upper extremity supported Standing balance-Leahy Scale: Poor                             ADL either performed or assessed with clinical judgement   ADL Overall ADL's : Needs assistance/impaired Eating/Feeding: NPO   Grooming: Wash/dry hands;Wash/dry face;Contact guard assist;Sitting   Upper Body Bathing: Minimal assistance;Sitting   Lower Body Bathing: Moderate assistance;Sitting/lateral leans   Upper Body Dressing : Minimal assistance;Sitting   Lower Body Dressing: Moderate assistance;Maximal assistance;Sitting/lateral leans   Toilet Transfer: Moderate assistance;BSC/3in1;Stand-pivot   Toileting- Clothing Manipulation and Hygiene: Maximal assistance;Sit to/from stand               Vision Baseline Vision/History: 1 Wears glasses Patient Visual Report: No change from baseline       Perception Perception: Not tested       Praxis Praxis: Not tested  Pertinent Vitals/Pain Pain Assessment Pain Assessment: No/denies pain Pain Intervention(s): Monitored during session     Extremity/Trunk Assessment Upper Extremity Assessment Upper Extremity Assessment: Generalized weakness   Lower Extremity Assessment Lower Extremity Assessment: Defer to PT evaluation   Cervical / Trunk Assessment Cervical / Trunk Assessment: Kyphotic   Communication  Communication Communication: No apparent difficulties   Cognition Arousal: Alert Behavior During Therapy: WFL for tasks assessed/performed Cognition: Cognition impaired       Memory impairment (select all impairments): Short-term memory Attention impairment (select first level of impairment): Focused attention Executive functioning impairment (select all impairments): Sequencing, Reasoning, Problem solving                   Following commands: Impaired Following commands impaired: Follows one step commands inconsistently, Follows one step commands with increased time     Cueing  General Comments   Cueing Techniques: Verbal cues      Exercises     Shoulder Instructions      Home Living Family/patient expects to be discharged to:: Shelter/Homeless                                        Prior Functioning/Environment Prior Level of Function : Independent/Modified Independent;Patient poor historian/Family not available             Mobility Comments: No AD per pt ADLs Comments: He reported being independent with ADLs, given housing insecurity and that he has to do for himself.    OT Problem List: Decreased strength;Decreased activity tolerance;Impaired balance (sitting and/or standing);Decreased cognition;Decreased safety awareness   OT Treatment/Interventions: Self-care/ADL training;Therapeutic exercise;Therapeutic activities;DME and/or AE instruction;Patient/family education;Balance training      OT Goals(Current goals can be found in the care plan section)   Acute Rehab OT Goals Patient Stated Goal: Unsure OT Goal Formulation: Patient unable to participate in goal setting Time For Goal Achievement: 01/03/24 Potential to Achieve Goals: Fair ADL Goals Pt Will Perform Grooming: with modified independence;standing Pt Will Perform Lower Body Dressing: with modified independence;sit to/from stand Pt Will Transfer to Toilet: regular height  toilet;ambulating;with modified independence   OT Frequency:  Min 2X/week    Co-evaluation              AM-PAC OT "6 Clicks" Daily Activity     Outcome Measure Help from another person eating meals?: Total Help from another person taking care of personal grooming?: A Patchin Help from another person toileting, which includes using toliet, bedpan, or urinal?: A Lot Help from another person bathing (including washing, rinsing, drying)?: A Lot Help from another person to put on and taking off regular upper body clothing?: A Tesch Help from another person to put on and taking off regular lower body clothing?: A Lot 6 Click Score: 13   End of Session Nurse Communication: Mobility status  Activity Tolerance: Patient tolerated treatment well Patient left: in bed;with call bell/phone within reach;with bed alarm set  OT Visit Diagnosis: Unsteadiness on feet (R26.81);Muscle weakness (generalized) (M62.81)                Time: 1610-9604 OT Time Calculation (min): 23 min Charges:  OT General Charges $OT Visit: 1 Visit OT Evaluation $OT Eval Moderate Complexity: 1 Mod OT Treatments $Self Care/Home Management : 8-22 mins  12/20/2023  RP, OTR/L  Acute Rehabilitation Services  Office:  [phone]   Benjamen Brand 12/20/2023, 9:29 AM
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
## 2023-12-20 NOTE — TOC Progression Note (Signed)
 Transition of Care (TOC) - Progression Note    Patient Details  Name: Anthony A Rotondo Jr. MRN: [id number] Date of Birth: [birth year]  Transition of Care [facility]) CM/SW Contact  Benjiman Bras, RN Phone Number: [phone] 12/20/2023, 12:54 PM  Clinical Narrative:     CIR reviewed for IP rehab. Pt is currently homeless and will need 24 hour caregiver in home if candidate for CIR. Referral to [facility] CSW to follow for SNF.   Expected Discharge Plan: IP Rehab Facility Barriers to Discharge: Continued Medical Work up  Expected Discharge Plan and Services       Living arrangements for the past 2 months: Homeless                                       Social Determinants of Health (SDOH) Interventions SDOH Screenings   Food Insecurity: Patient Unable To Answer (12/09/2023)  Housing: Patient Unable To Answer (12/09/2023)  Transportation Needs: Patient Unable To Answer (12/09/2023)  Utilities: Patient Unable To Answer (12/09/2023)  Depression (PHQ2-9): Medium Risk (10/16/2020)  Social Connections: Patient Unable To Answer (12/09/2023)  Tobacco Use: High Risk (12/08/2023)    Readmission Risk Interventions    08/27/2023   12:21 PM 08/19/2023    9:17 AM 07/12/2023    9:58 AM  Readmission Risk Prevention Plan  Transportation Screening Complete Complete Complete  Medication Review Oceanographer) Complete Complete Complete  PCP or Specialist appointment within 3-5 days of discharge Complete  --  HRI or Home Care Consult Complete Complete Complete  SW Recovery Care/Counseling Consult Complete Complete Complete  Palliative Care Screening Not Applicable Not Applicable Not Applicable  Skilled Nursing Facility Not Applicable Not Applicable Not Applicable

## 2023-12-20 NOTE — Progress Notes (Signed)
 Inpatient Rehab Admissions Coordinator:   Met with pt at bedside to review rehab recommendations.  Pt confirms currently unhoused.  Mentioned a "longer term set up" that may be SNF, but pt with difficulty tracking conversation so hard to tell what exactly he was referring to.  In any case, he does confirm he does not have any support system to provide housing or supervision at discharge so will need SNF referral.  I will sign off at this time.   Loye Rumble, PT, DPT Admissions Coordinator [phone] 12/20/23  1:47 PM

## 2023-12-20 NOTE — Progress Notes (Signed)
 PROGRESS NOTE    Anthony A Wyke Jr.  ZOX:[id number] DOB: 24-Jul-1955 DOA: 12/07/2023 PCP: Pcp, No     Brief Narrative:   68 year old man who presented to [facility] ED 5/20 for SOB. PMHx significant for HTN, aortic atherosclerosis, PAF, PE (previously on Eliquis ), EtOH abuse c/b withdrawal/DTs, tobacco abuse, hiatal hernia (s/p Nissen 2016), chronic low back pain, anxiety. Patient is also unhoused. Recent presentation to ED 5/19 requesting EtOH detox; patient was deemed to be not actively in EtOH withdrawal and was discharged, however upon discharge patient threatened staff and was escorted off of the premises by [employer].   On ED arrival, patient was afebrile, HR 92, BP 139/117, RR 30, SpO2 90%. Labs were notable for WBC 3.2, Hgb 7.5, Plt 220. Na 139, K 3.7, CO2 14, Cr 0.72. Glucose 61. AST 74, ALT 28, Alk Phos 112, Tbili 0.8. Lipase WNL. Ethanol 57 (221). TSH WNL. Head with chronic atrophic changes without acute abnormality. CT A/P without acute process noted. RUQ US  with GB sludge, +GB wall thickening/edema, no stones or ductal dilatation. CIWA protocol initiated. Admitted by TRH for EtOH withdrawal, delirium and hypoglycemia.    While awaiting bed placement, patient became intermittently agitated requiring Ativan  administration per CIWA and soft wrist restraints to prevent harm. ED RN was unable to obtain good waveform on SpO2 monitor (reading 60s-70s) without change in mentation or ability to protect airway. ABG was obtained with pH 7.31/pCO2 19.5/pO2 375/bicarb 9.8, suggesting SpO2 not accurate.   5/20 - Presented to [facility] for SOB. Concern for EtOH withdrawal. 5/21 - PCCM consulted for intermittent agitation, ?Precedex /ICU. 5/22 dex, librium  and clonaz  5/23 acidosis improved. Sedated so dex off. Getting lasix  awaiting HIDA. Repeating LFTs.  Seen by GI felt evaluation not consistent with cholangitis but more Secondary to shock liver/hypoperfusion.  Started on stress dose steroids 5/25 CVL diuresis, severe  agitated delirium.  Seen by Advanced heart failure for biventricular heart failure  5/27: Clonazepam  added 5/28 a Esper too sedated 5/30 RP bleed, heparin  stopped 6/2 transferred to hospitalist service  Assessment & Plan:   Principal Problem:   Hypoglycemia Active Problems:   Paroxysmal atrial fibrillation (HCC)   HTN (hypertension)   Macrocytic anemia   History of pulmonary embolus (PE)   Alcohol  withdrawal (HCC)   Alcohol  withdrawal delirium (HCC)   Lactic acidosis   Acute delirium  # Alcohol  withdrawal, complicated Resolved - continue vitamins  # Hospital delirium Neuro evaluated, mri nothing acute but advanced atrophy, thinks hospital delirium superimposed on cognitive impairment. Treated here for possible wernicke. May also have hepatic encephalopathy as below - appears stable today  # A-fib Rvr this hospitalization - continue amio - anticoagulation on hold  # HFrEF Eft 35-40 on tte. Appears euvolemic today - continue spiro  # Homelessness  # History PE Was on apixaban , now on pause  # MDD - home brexiprazole  # Retroperitoneal hematoma # Acute blood loss anemia Diagnosed 5/30, moderate in size. Has received total of 5 units last on 5/31, hgb of 8.6 today is stable, no signs active blood loss - anticoagulation on hold  # Cirrhosis # Hepatic encephalopathy - will d/c lactulose  as currently on 10 daily and multiple BMs this morning  # NPO status Currently getting tube feeds. Is awake and alert - slp eval may be able to d/c tube feeds  # Hypoglycemia - continue d10  # Lines No longer appears to need central access - d/c central line   DVT prophylaxis: SCDs Code Status: DNR  Family Communication: none at bedside  Level of care: Progressive Status is: Inpatient Remains inpatient appropriate because: severity of illness Dispo: being evaluated for inpatient rehab    Consultants:  Heart failure  Procedures: none  Antimicrobials:  none     Subjective: No complaints  Objective: Vitals:   12/20/23 0930 12/20/23 0945 12/20/23 1000 12/20/23 1100  BP:      Pulse: 88 99 94 94  Resp: 18 17 (!) 24 (!) 26  Temp:    97.8 F (36.6 C)  TempSrc:    Axillary  SpO2: 100% 100% 97% 100%  Weight:      Height:        Intake/Output Summary (Last 24 hours) at 12/20/2023 1214 Last data filed at 12/20/2023 1100 Gross per 24 hour  Intake 993.1 ml  Output 1205 ml  Net -211.9 ml   Filed Weights   12/17/23 0500 12/19/23 0500 12/20/23 0500  Weight: 75.2 kg 68.6 kg 68.7 kg    Examination:  General exam: Appears calm and comfortable  Respiratory system: Clear to auscultation save for rales at bases Cardiovascular system: S1 & S2 heard, RR  Gastrointestinal system: Abdomen is nondistended, soft and nontender.   Central nervous system: Alert and oriented to self, moving all 4 Extremities: Symmetric 5 x 5 power. Skin: No visible rashes, lesions or ulcers Psychiatry: mild confusion, calm    Data Reviewed: I have personally reviewed following labs and imaging studies  CBC: Recent Labs  Lab 12/16/23 1741 12/16/23 2324 12/17/23 0505 12/17/23 1127 12/18/23 1225 12/18/23 2351 12/19/23 0510 12/19/23 1756 12/20/23 0525  WBC 5.8 6.4 6.8  --   --   --   --  6.5 6.4  HGB 7.4* 6.8* 7.4*   < > 7.1* 8.7* 7.9* 8.6* 8.6*  HCT 23.0* 21.0* 22.9*   < > 22.0* 26.4* 24.5* 26.2* 26.9*  MCV 89.5 90.1 91.6  --   --   --   --  90.3 91.5  PLT 92* 94* 87*  --   --   --   --  133* 167   < > = values in this interval not displayed.   Basic Metabolic Panel: Recent Labs  Lab 12/16/23 0345 12/17/23 0505 12/18/23 0314 12/19/23 0510 12/20/23 0525  NA 139 139 139 137 131*  130*  K 4.2 3.7 3.2* 3.9 4.5  4.5  CL 100 102 100 100 101  101  CO2 32 29 29 26 24  24   GLUCOSE 115* 148* 111* 110* 86  86  BUN 28* 26* 25* 24* 20  20  CREATININE 0.69 0.61 0.63 0.51* 0.54*  0.49*  CALCIUM  8.1* 8.2* 8.2* 8.4* 8.3*  8.3*  MG 1.9 1.7 1.6* 1.7 1.7   PHOS 4.0 2.6 2.9 3.3 3.6  3.5   GFR: Estimated Creatinine Clearance: 85.5 mL/min (A) (by C-G formula based on SCr of 0.49 mg/dL (L)). Liver Function Tests: Recent Labs  Lab 12/14/23 0500 12/15/23 0533 12/16/23 0345 12/20/23 0525  AST 386* 397* 251* 75*  ALT 211* 223* 168* 77*  ALKPHOS 74 84 75 114  BILITOT 1.0 1.0 0.9 2.5*  PROT 5.7* 6.3* 6.0* 6.4*  ALBUMIN  3.6 3.7 3.2* 2.9*  2.9*   No results for input(s): "LIPASE", "AMYLASE" in the last 168 hours. Recent Labs  Lab 12/15/23 0500 12/18/23 0314  AMMONIA 22 16   Coagulation Profile: Recent Labs  Lab 12/16/23 1158 12/18/23 0314  INR 1.4* 1.3*   Cardiac Enzymes: No results for input(s): "CKTOTAL", "CKMB", "CKMBINDEX", "  TROPONINI" in the last 168 hours. BNP (last 3 results) No results for input(s): "PROBNP" in the last 8760 hours. HbA1C: No results for input(s): "HGBA1C" in the last 72 hours. CBG: Recent Labs  Lab 12/20/23 0016 12/20/23 0431 12/20/23 0758 12/20/23 1114 12/20/23 1116  GLUCAP 99 105* 78 68* 79   Lipid Profile: No results for input(s): "CHOL", "HDL", "LDLCALC", "TRIG", "CHOLHDL", "LDLDIRECT" in the last 72 hours. Thyroid Function Tests: No results for input(s): "TSH", "T4TOTAL", "FREET4", "T3FREE", "THYROIDAB" in the last 72 hours. Anemia Panel: No results for input(s): "VITAMINB12", "FOLATE", "FERRITIN", "TIBC", "IRON ", "RETICCTPCT" in the last 72 hours. Urine analysis:    Component Value Date/Time   COLORURINE YELLOW 12/11/2023 1408   APPEARANCEUR CLEAR 12/11/2023 1408   LABSPEC 1.013 12/11/2023 1408   PHURINE 6.0 12/11/2023 1408   GLUCOSEU NEGATIVE 12/11/2023 1408   HGBUR NEGATIVE 12/11/2023 1408   BILIRUBINUR NEGATIVE 12/11/2023 1408   KETONESUR NEGATIVE 12/11/2023 1408   PROTEINUR NEGATIVE 12/11/2023 1408   UROBILINOGEN 1.0 10/30/2010 1345   NITRITE NEGATIVE 12/11/2023 1408   LEUKOCYTESUR NEGATIVE 12/11/2023 1408   Sepsis Labs: @LABRCNTIP (procalcitonin:4,lacticidven:4)  )No  results found for this or any previous visit (from the past 240 hours).       Radiology Studies: MR BRAIN WO CONTRAST Result Date: 12/20/2023 CLINICAL DATA:  Initial evaluation for acute mental status change, unknown cause. EXAM: MRI HEAD WITHOUT CONTRAST TECHNIQUE: Multiplanar, multiecho pulse sequences of the brain and surrounding structures were obtained without intravenous contrast. COMPARISON:  Comparison made with prior CT from 12/08/2023 and brain MRI from 08/09/2023 FINDINGS: Brain: Diffuse prominence of the CSF containing spaces compatible with generalized cerebral atrophy, advanced for age. Patchy T2/FLAIR hyperintensity involving the supratentorial cerebral white matter, most likely related chronic microvascular ischemic disease, overall mild in nature. No evidence for acute or subacute infarct. No areas of chronic cortical infarction. No acute or chronic intracranial blood products. No mass lesion, midline shift or mass effect. No hydrocephalus or extra-axial fluid collection. Pituitary gland within normal limits. Vascular: Major intracranial vascular flow voids are maintained. Skull and upper cervical spine: Craniocervical junction within normal limits. Bone marrow signal intensity normal. No scalp soft tissue abnormality. Sinuses/Orbits: Prior bilateral ocular lens replacement. Paranasal sinuses are largely clear. Small bilateral mastoid effusions noted, of doubtful significance. Image nasopharynx unremarkable. Other: Nasogastric tube in place. IMPRESSION: 1. No acute intracranial abnormality. 2. Advanced cerebral atrophy for age with mild chronic microvascular ischemic disease. Electronically Signed   By: Virgia Griffins M.D.   On: 12/20/2023 02:30        Scheduled Meds:  sodium chloride    Intravenous Once   [START ON 12/26/2023] amiodarone   200 mg Per Tube BID   amiodarone   400 mg Per Tube BID   brexpiprazole  0.5 mg Per Tube QHS   [START ON 12/26/2023] brexpiprazole  1 mg Per  Tube QHS   [START ON 01/02/2024] brexpiprazole  2 mg Per Tube QHS   Chlorhexidine  Gluconate Cloth  6 each Topical Daily   feeding supplement (PROSource TF20)  60 mL Per Tube Daily   folic acid   1 mg Per Tube Daily   Gerhardt's butt cream   Topical BID   lactulose   10 g Per Tube Daily   lidocaine   1 patch Transdermal Q24H   mouth rinse  15 mL Mouth Rinse 4 times per day   pantoprazole  (PROTONIX ) IV  40 mg Intravenous Daily   sodium chloride  flush  10-40 mL Intracatheter Q12H   spironolactone   12.5 mg  Oral Daily   thiamine   100 mg Per Tube Daily   Or   thiamine   100 mg Intravenous Daily   Continuous Infusions:  dextrose  10 mL/hr at 12/20/23 1100   feeding supplement (VITAL 1.5 CAL) 55 mL/hr at 12/20/23 1100     LOS: 12 days     Raymonde Calico, MD [facility]   If 7PM-7AM, please contact night-coverage [email or web address] Password [facility] 12/20/2023, 12:14 PM

## 2023-12-21 DIAGNOSIS — E162 Hypoglycemia, unspecified: Secondary | ICD-10-CM | POA: Diagnosis not present

## 2023-12-21 LAB — GLUCOSE, CAPILLARY
Glucose-Capillary: 104 mg/dL — ABNORMAL HIGH (ref 70–99)
Glucose-Capillary: 104 mg/dL — ABNORMAL HIGH (ref 70–99)
Glucose-Capillary: 108 mg/dL — ABNORMAL HIGH (ref 70–99)
Glucose-Capillary: 83 mg/dL (ref 70–99)
Glucose-Capillary: 85 mg/dL (ref 70–99)
Glucose-Capillary: 99 mg/dL (ref 70–99)

## 2023-12-21 LAB — COMPREHENSIVE METABOLIC PANEL WITH GFR
ALT: 72 U/L — ABNORMAL HIGH (ref 0–44)
AST: 63 U/L — ABNORMAL HIGH (ref 15–41)
Albumin: 3.1 g/dL — ABNORMAL LOW (ref 3.5–5.0)
Alkaline Phosphatase: 115 U/L (ref 38–126)
Anion gap: 8 (ref 5–15)
BUN: 19 mg/dL (ref 8–23)
CO2: 19 mmol/L — ABNORMAL LOW (ref 22–32)
Calcium: 8.5 mg/dL — ABNORMAL LOW (ref 8.9–10.3)
Chloride: 99 mmol/L (ref 98–111)
Creatinine, Ser: 0.54 mg/dL — ABNORMAL LOW (ref 0.61–1.24)
GFR, Estimated: >60 mL/min (ref >60)
Glucose, Bld: 95 mg/dL (ref 70–99)
Potassium: 4.6 mmol/L (ref 3.5–5.1)
Sodium: 126 mmol/L — ABNORMAL LOW (ref 135–145)
Total Bilirubin: 2.6 mg/dL — ABNORMAL HIGH (ref 0.0–1.2)
Total Protein: 6.7 g/dL (ref 6.5–8.1)

## 2023-12-21 LAB — OSMOLALITY: Osmolality: 264 mosm/kg — ABNORMAL LOW (ref 275–295)

## 2023-12-21 LAB — OSMOLALITY, URINE: Osmolality, Ur: 515 mosm/kg (ref 300–900)

## 2023-12-21 LAB — CBC
HCT: 28.5 % — ABNORMAL LOW (ref 39.0–52.0)
Hemoglobin: 9.4 g/dL — ABNORMAL LOW (ref 13.0–17.0)
MCH: 29.4 pg (ref 26.0–34.0)
MCHC: 33 g/dL (ref 30.0–36.0)
MCV: 89.1 fL (ref 80.0–100.0)
Platelets: 213 10*3/uL (ref 150–400)
RBC: 3.2 MIL/uL — ABNORMAL LOW (ref 4.22–5.81)
RDW: 17.2 % — ABNORMAL HIGH (ref 11.5–15.5)
WBC: 9.9 10*3/uL (ref 4.0–10.5)
nRBC: 0 % (ref 0.0–0.2)

## 2023-12-21 LAB — SODIUM, URINE, RANDOM: Sodium, Ur: 115 mmol/L

## 2023-12-21 LAB — TSH: TSH: 5.918 u[IU]/mL — ABNORMAL HIGH (ref 0.350–4.500)

## 2023-12-21 LAB — PHOSPHORUS: Phosphorus: 3.4 mg/dL (ref 2.5–4.6)

## 2023-12-21 LAB — MAGNESIUM: Magnesium: 1.5 mg/dL — ABNORMAL LOW (ref 1.7–2.4)

## 2023-12-21 MED ORDER — VITAL 1.5 CAL PO LIQD
840.0000 mL | ORAL | Status: DC
  Administered 2023-12-21 – 2023-12-23 (×3): 840 mL
  Filled 2023-12-21 (×4): qty 1000
  Filled 2023-12-21: qty 948
  Filled 2023-12-21 (×2): qty 1000

## 2023-12-21 MED ORDER — ENSURE PLUS HIGH PROTEIN PO LIQD
237.0000 mL | Freq: Three times a day (TID) | ORAL | Status: DC
  Administered 2023-12-21 – 2024-01-10 (×51): 237 mL via ORAL

## 2023-12-21 MED ORDER — MAGNESIUM SULFATE 2 GM/50ML IV SOLN
2.0000 g | Freq: Once | INTRAVENOUS | Status: AC
  Administered 2023-12-21: 2 g via INTRAVENOUS
  Filled 2023-12-21: qty 50

## 2023-12-21 MED ORDER — SODIUM CHLORIDE 1 G PO TABS
1.0000 g | ORAL_TABLET | Freq: Three times a day (TID) | ORAL | Status: DC
  Administered 2023-12-21 – 2023-12-27 (×18): 1 g via NASOGASTRIC
  Filled 2023-12-21 (×18): qty 1

## 2023-12-21 NOTE — Progress Notes (Signed)
 PROGRESS NOTE    Anthony A Chard Jr.  ZOX:[id number] DOB: 1956-04-04 DOA: 12/07/2023 PCP: Pcp, No     Brief Narrative:   68 year old man who presented to [facility] ED 5/20 for SOB. PMHx significant for HTN, aortic atherosclerosis, PAF, PE (previously on Eliquis ), EtOH abuse c/b withdrawal/DTs, tobacco abuse, hiatal hernia (s/p Nissen 2016), chronic low back pain, anxiety. Patient is also unhoused. Recent presentation to ED 5/19 requesting EtOH detox; patient was deemed to be not actively in EtOH withdrawal and was discharged, however upon discharge patient threatened staff and was escorted off of the premises by [employer].   On ED arrival, patient was afebrile, HR 92, BP 139/117, RR 30, SpO2 90%. Labs were notable for WBC 3.2, Hgb 7.5, Plt 220. Na 139, K 3.7, CO2 14, Cr 0.72. Glucose 61. AST 74, ALT 28, Alk Phos 112, Tbili 0.8. Lipase WNL. Ethanol 57 (221). TSH WNL. Head with chronic atrophic changes without acute abnormality. CT A/P without acute process noted. RUQ US  with GB sludge, +GB wall thickening/edema, no stones or ductal dilatation. CIWA protocol initiated. Admitted by TRH for EtOH withdrawal, delirium and hypoglycemia.    While awaiting bed placement, patient became intermittently agitated requiring Ativan  administration per CIWA and soft wrist restraints to prevent harm. ED RN was unable to obtain good waveform on SpO2 monitor (reading 60s-70s) without change in mentation or ability to protect airway. ABG was obtained with pH 7.31/pCO2 19.5/pO2 375/bicarb 9.8, suggesting SpO2 not accurate.   5/20 - Presented to [facility] for SOB. Concern for EtOH withdrawal. 5/21 - PCCM consulted for intermittent agitation, ?Precedex /ICU. 5/22 dex, librium  and clonaz  5/23 acidosis improved. Sedated so dex off. Getting lasix  awaiting HIDA. Repeating LFTs.  Seen by GI felt evaluation not consistent with cholangitis but more Secondary to shock liver/hypoperfusion.  Started on stress dose steroids 5/25 CVL diuresis, severe  agitated delirium.  Seen by Advanced heart failure for biventricular heart failure  5/27: Clonazepam  added 5/28 a Grall too sedated 5/30 RP bleed, heparin  stopped 6/2 transferred to hospitalist service  Assessment & Plan:   Principal Problem:   Hypoglycemia Active Problems:   Paroxysmal atrial fibrillation (HCC)   HTN (hypertension)   Macrocytic anemia   History of pulmonary embolus (PE)   Alcohol  withdrawal (HCC)   Alcohol  withdrawal delirium (HCC)   Lactic acidosis   Acute delirium  # Alcohol  withdrawal, complicated Resolved - continue vitamins  # Hospital delirium Neuro evaluated, mri nothing acute but advanced atrophy, thinks hospital delirium superimposed on cognitive impairment. Treated here for possible wernicke. May also have hepatic encephalopathy as below - appears stable today  # A-fib Rvr this hospitalization, resolved - continue amio - anticoagulation on hold 2/2 retroperitoneal bleed  # HFrEF Eft 35-40 on tte. Appears euvolemic today - continue spiro  # Homelessness  # History PE Was on apixaban , now on pause 2/2 bleed  # MDD - hold brexiprazole  # Hyponatremia New and worsening last few days. Appears euvolemic. Was started on brexiprazole several days ago.  - hold brexiprazole - start salt tabs - f/u urine and serum osm, urine sodium, tsh, am cortisol  # Fever New fever 100.4 evening of 6/2. Resolved without anti-pyretic and none since. No localizing signs/symptoms, normal white count. Central line discontinued 6/2. - monitor, would initiate workup if recurrent fever or other signs/symptoms of infection  # Retroperitoneal hematoma # Acute blood loss anemia Diagnosed 5/30, moderate in size. Has received total of 5 units last on 5/31, hgb now stable -  anticoagulation on hold  # Cirrhosis # Hepatic encephalopathy - will d/c lactulose  as currently on 10 daily and many bms overnight and today, rn says at least 4 thus far today  # NPO  status Currently getting tube feeds. Is awake and alert - slp following, po remains poor  # Hypoglycemia - continue d10   DVT prophylaxis: SCDs Code Status: DNR  Family Communication: none at bedside  Level of care: Progressive Status is: Inpatient Remains inpatient appropriate because: severity of illness Dispo: snf, TOC consulted    Consultants:  Heart failure  Procedures: none  Antimicrobials:  none    Subjective: No complaints save for generalized body pain. Ambulated with assistance around unit, was up in chair earlier today  Objective: Vitals:   12/21/23 0600 12/21/23 0800 12/21/23 1100 12/21/23 1500  BP:  111/71    Pulse:      Resp: (!) 22 (!) 21    Temp:   99.1 F (37.3 C) 97.6 F (36.4 C)  TempSrc:   Axillary Oral  SpO2:      Weight:      Height:        Intake/Output Summary (Last 24 hours) at 12/21/2023 1558 Last data filed at 12/21/2023 1516 Gross per 24 hour  Intake 577.98 ml  Output 2900 ml  Net -2322.02 ml   Filed Weights   12/19/23 0500 12/20/23 0500 12/21/23 0355  Weight: 68.6 kg 68.7 kg 68.2 kg    Examination:  General exam: Appears calm and comfortable  Respiratory system: Clear to auscultation save for rales at bases Cardiovascular system: S1 & S2 heard, RR  Gastrointestinal system: Abdomen is nondistended, soft and nontender.   Central nervous system: Alert and oriented to self, moving all 4 Extremities: Symmetric 5 x 5 power. Skin: No visible rashes, lesions or ulcers Psychiatry: mild confusion, calm    Data Reviewed: I have personally reviewed following labs and imaging studies  CBC: Recent Labs  Lab 12/16/23 2324 12/17/23 0505 12/17/23 1127 12/18/23 2351 12/19/23 0510 12/19/23 1756 12/20/23 0525 12/21/23 0332  WBC 6.4 6.8  --   --   --  6.5 6.4 9.9  HGB 6.8* 7.4*   < > 8.7* 7.9* 8.6* 8.6* 9.4*  HCT 21.0* 22.9*   < > 26.4* 24.5* 26.2* 26.9* 28.5*  MCV 90.1 91.6  --   --   --  90.3 91.5 89.1  PLT 94* 87*  --    --   --  133* 167 213   < > = values in this interval not displayed.   Basic Metabolic Panel: Recent Labs  Lab 12/17/23 0505 12/18/23 0314 12/19/23 0510 12/20/23 0525 12/21/23 0332  NA 139 139 137 131*  130* 126*  K 3.7 3.2* 3.9 4.5  4.5 4.6  CL 102 100 100 101  101 99  CO2 29 29 26 24  24  19*  GLUCOSE 148* 111* 110* 86  86 95  BUN 26* 25* 24* 20  20 19   CREATININE 0.61 0.63 0.51* 0.54*  0.49* 0.54*  CALCIUM  8.2* 8.2* 8.4* 8.3*  8.3* 8.5*  MG 1.7 1.6* 1.7 1.7 1.5*  PHOS 2.6 2.9 3.3 3.6  3.5 3.4   GFR: Estimated Creatinine Clearance: 85.3 mL/min (A) (by C-G formula based on SCr of 0.54 mg/dL (L)). Liver Function Tests: Recent Labs  Lab 12/15/23 0533 12/16/23 0345 12/20/23 0525 12/21/23 0332  AST 397* 251* 75* 63*  ALT 223* 168* 77* 72*  ALKPHOS 84 75 114 115  BILITOT 1.0  0.9 2.5* 2.6*  PROT 6.3* 6.0* 6.4* 6.7  ALBUMIN  3.7 3.2* 2.9*  2.9* 3.1*   No results for input(s): "LIPASE", "AMYLASE" in the last 168 hours. Recent Labs  Lab 12/15/23 0500 12/18/23 0314  AMMONIA 22 16   Coagulation Profile: Recent Labs  Lab 12/16/23 1158 12/18/23 0314  INR 1.4* 1.3*   Cardiac Enzymes: No results for input(s): "CKTOTAL", "CKMB", "CKMBINDEX", "TROPONINI" in the last 168 hours. BNP (last 3 results) No results for input(s): "PROBNP" in the last 8760 hours. HbA1C: No results for input(s): "HGBA1C" in the last 72 hours. CBG: Recent Labs  Lab 12/21/23 0007 12/21/23 0341 12/21/23 0730 12/21/23 1128 12/21/23 1530  GLUCAP 108* 99 104* 85 83   Lipid Profile: No results for input(s): "CHOL", "HDL", "LDLCALC", "TRIG", "CHOLHDL", "LDLDIRECT" in the last 72 hours. Thyroid Function Tests: No results for input(s): "TSH", "T4TOTAL", "FREET4", "T3FREE", "THYROIDAB" in the last 72 hours. Anemia Panel: No results for input(s): "VITAMINB12", "FOLATE", "FERRITIN", "TIBC", "IRON ", "RETICCTPCT" in the last 72 hours. Urine analysis:    Component Value Date/Time    COLORURINE YELLOW 12/11/2023 1408   APPEARANCEUR CLEAR 12/11/2023 1408   LABSPEC 1.013 12/11/2023 1408   PHURINE 6.0 12/11/2023 1408   GLUCOSEU NEGATIVE 12/11/2023 1408   HGBUR NEGATIVE 12/11/2023 1408   BILIRUBINUR NEGATIVE 12/11/2023 1408   KETONESUR NEGATIVE 12/11/2023 1408   PROTEINUR NEGATIVE 12/11/2023 1408   UROBILINOGEN 1.0 10/30/2010 1345   NITRITE NEGATIVE 12/11/2023 1408   LEUKOCYTESUR NEGATIVE 12/11/2023 1408   Sepsis Labs: @LABRCNTIP (procalcitonin:4,lacticidven:4)  )No results found for this or any previous visit (from the past 240 hours).       Radiology Studies: MR BRAIN WO CONTRAST Result Date: 12/20/2023 CLINICAL DATA:  Initial evaluation for acute mental status change, unknown cause. EXAM: MRI HEAD WITHOUT CONTRAST TECHNIQUE: Multiplanar, multiecho pulse sequences of the brain and surrounding structures were obtained without intravenous contrast. COMPARISON:  Comparison made with prior CT from 12/08/2023 and brain MRI from 08/09/2023 FINDINGS: Brain: Diffuse prominence of the CSF containing spaces compatible with generalized cerebral atrophy, advanced for age. Patchy T2/FLAIR hyperintensity involving the supratentorial cerebral white matter, most likely related chronic microvascular ischemic disease, overall mild in nature. No evidence for acute or subacute infarct. No areas of chronic cortical infarction. No acute or chronic intracranial blood products. No mass lesion, midline shift or mass effect. No hydrocephalus or extra-axial fluid collection. Pituitary gland within normal limits. Vascular: Major intracranial vascular flow voids are maintained. Skull and upper cervical spine: Craniocervical junction within normal limits. Bone marrow signal intensity normal. No scalp soft tissue abnormality. Sinuses/Orbits: Prior bilateral ocular lens replacement. Paranasal sinuses are largely clear. Small bilateral mastoid effusions noted, of doubtful significance. Image nasopharynx  unremarkable. Other: Nasogastric tube in place. IMPRESSION: 1. No acute intracranial abnormality. 2. Advanced cerebral atrophy for age with mild chronic microvascular ischemic disease. Electronically Signed   By: Virgia Griffins M.D.   On: 12/20/2023 02:30        Scheduled Meds:  sodium chloride    Intravenous Once   [START ON 12/26/2023] amiodarone   200 mg Oral BID   amiodarone   400 mg Oral BID   brexpiprazole  0.5 mg Oral QHS   [START ON 12/26/2023] brexpiprazole  1 mg Oral QHS   [START ON 01/02/2024] brexpiprazole  2 mg Oral QHS   Chlorhexidine  Gluconate Cloth  6 each Topical Daily   feeding supplement  237 mL Oral TID BM   feeding supplement (PROSource TF20)  60 mL Per Tube Daily  folic acid   1 mg Oral Daily   Gerhardt's butt cream   Topical BID   lactulose   10 g Oral Daily   lidocaine   1 patch Transdermal Q24H   mouth rinse  15 mL Mouth Rinse 4 times per day   pantoprazole  (PROTONIX ) IV  40 mg Intravenous Daily   sodium chloride  flush  10-40 mL Intracatheter Q12H   spironolactone   12.5 mg Oral Daily   thiamine   100 mg Oral Daily   Or   thiamine   100 mg Intravenous Daily   Continuous Infusions:  dextrose  25 mL/hr at 12/21/23 1516   feeding supplement (VITAL 1.5 CAL)       LOS: 13 days     Raymonde Calico, MD [facility]   If 7PM-7AM, please contact night-coverage [email or web address] Password [facility] 12/21/2023, 3:58 PM

## 2023-12-21 NOTE — Progress Notes (Signed)
 Nutrition Follow-up  DOCUMENTATION CODES:   Non-severe (moderate) malnutrition in context of chronic illness  INTERVENTION:   Plan to trial nocturnal TF, will need to continue to monitor blood sugars closely as pt will not be receiving continuous source of nutrition via TF. If po intake does not improve, consider increasing back to goal rate  Consider downgrading diet to Dysphagia 3 (easy to chew) diet given extremely poor dentition (pt reports pain and difficulty with chewing certain foods). Encourage pt to order foods that are tolerated best but concerned with mentation, pt may not be able to do so.  Ensure Enlive po TID, each supplement provides 350 kcal and 20 grams of protein.  Add Magic cup at [employer], each supplement provides 290 kcal and 9 grams of protein  Feeding assistance at meal times  Tube Feeding via Cortrak: Trial 12 hour nocturnal TF Vital 1.5 at 70 ml/hr x 12 hours TF provides 57 g of protein, 1260 kcals, 638 mL of free water . Provides around 60% of estimated nutritional needs   NUTRITION DIAGNOSIS:   Moderate Malnutrition related to social / environmental circumstances (ETOH use, homeless) as evidenced by moderate fat depletion, moderate muscle depletion.  Continues but being addressed  GOAL:   Patient will meet greater than or equal to 90% of their needs  Progressing  MONITOR:   Diet advancement, Labs, Weight trends, TF tolerance  REASON FOR ASSESSMENT:   Consult Assessment of nutrition requirement/status  ASSESSMENT:   68 yo male admitted with EtOH withdrawal with DTs, lactic acidosis, concern for possible sepsis-?biliary source. PMH includes EtOH abuse with recurrent admissions for DTs, tobacco use, hiatal hernia s/p nissen (2016), HTN. Pt is homeless   5/21: CT and US  - findings of gallstones/sludge and GBW thickening; fatty liver, non cirrhotic; unremarkable pancreas 5/23: HIDA- normal GB filling but no emptying in the small bowel;  Cortrak placed 5/30 CTA: RP bleed, diffuse anasarca and free fluid within the abdomen and pelvis    Palliative Care following, pt is DNR  RP bleed, Hgb currently stable, no signs of aactive blood loss  Pt sitting in chair on visit today, alert. Pt not oriented x 4 per report but able to tell RN year is 2025 and he is in the hospital.   Pt reports appetite is poor but reports appetite has been poor for years. Pt ate bites this AM.   Pt with extremely poor dentition, missing teeth but the teeth that remain are in poor condition, worn down significantly. Difficulty chewing some foods, causes some pain  Continues on continuous TF Vital 1.5 at 55 ml/hr via Cortrak.  Noted despite continuous TF infusion, pt continues to require D10 infusion (25 ml/hr) to prevent hypoglycemia  UOP 1.4 L in 24 hours  Ammonia 16 (wdl) on 5/31, remains on lactulose . +type 7 yellow stool  Labs: CBGs 68-122  Magesium 1.5 (L) Potassium 4.6 (wdl) Sodium 126 (L) Serum glucose 95 (wdl)  Meds:  Thiamine  100 mg (IV or oral) Lactulose  10 mg daily Folic Acid  1 mg daily   Diet Order:   Diet Order             Diet regular Room service appropriate? Yes with Assist; Fluid consistency: Thin  Diet effective now                   EDUCATION NEEDS:   No education needs have been identified at this time  Skin:  Skin Assessment: Skin Integrity Issues: Skin Integrity Issues:: Stage  II Stage II: L tibial  Last BM:  6/3 large type 6 yellow (FMS removed)  Height:   Ht Readings from Last 1 Encounters:  12/09/23 5\' 8"  (1.727 m)    Weight:   Wt Readings from Last 1 Encounters:  12/21/23 68.2 kg    Ideal Body Weight:  70 kg  BMI:  Body mass index is 22.86 kg/m.  Estimated Nutritional Needs:   Kcal:  1900-2100  Protein:  95-110g  Fluid:  >/=1.9L    Norvel Beer MS, RDN, LDN, CNSC Registered Dietitian 3 Clinical Nutrition RD Inpatient Contact Info in Amion

## 2023-12-21 NOTE — TOC Progression Note (Signed)
 Transition of Care (TOC) - Progression Note    Patient Details  Name: Anthony A Merlo Jr. MRN: [id number] Date of Birth: [birth year]  Transition of Care [facility]) CM/SW Contact  Carmon Christen, LCSWA Phone Number: 12/21/2023, 5:33 PM  Clinical Narrative:     CSW informed by inpatient rehab that patient not current candidate for CIR. Due to patients current orientation CSW LVM for patients brother Anthony [name]. CSW awaiting call back to discuss PT recs/dc plan for patient. CSW will continue to follow.  Expected Discharge Plan: IP Rehab Facility Barriers to Discharge: Continued Medical Work up  Expected Discharge Plan and Services       Living arrangements for the past 2 months: Homeless                                       Social Determinants of Health (SDOH) Interventions SDOH Screenings   Food Insecurity: Patient Unable To Answer (12/09/2023)  Housing: Patient Unable To Answer (12/09/2023)  Transportation Needs: Patient Unable To Answer (12/09/2023)  Utilities: Patient Unable To Answer (12/09/2023)  Depression (PHQ2-9): Medium Risk (10/16/2020)  Social Connections: Patient Unable To Answer (12/09/2023)  Tobacco Use: High Risk (12/08/2023)    Readmission Risk Interventions    08/27/2023   12:21 PM 08/19/2023    9:17 AM 07/12/2023    9:58 AM  Readmission Risk Prevention Plan  Transportation Screening Complete Complete Complete  Medication Review Oceanographer) Complete Complete Complete  PCP or Specialist appointment within 3-5 days of discharge Complete  --  HRI or Home Care Consult Complete Complete Complete  SW Recovery Care/Counseling Consult Complete Complete Complete  Palliative Care Screening Not Applicable Not Applicable Not Applicable  Skilled Nursing Facility Not Applicable Not Applicable Not Applicable

## 2023-12-21 NOTE — Progress Notes (Signed)
 Physical Therapy Treatment Patient Details Name: Anthony A Pitkin Jr. MRN: [id number] DOB: Nov 03, 1955 Today's Date: 12/21/2023   History of Present Illness Pt is a 68 y.o. male who presented 12/06/23 with SOB. Shortly after arriving, pt became agitated and was given IV Ativan . CT head is unremarkable. Patient admitted for EtOH withdrawal, acute delirium, and hypoglycemia. Cortrak placed 5/23.  The day before the patient had presented to ED requesting EtOH detox; patient was deemed to be not actively in EtOH withdrawal and was discharged, however upon discharge patient threatened staff and was escorted off of the premises by [employer]. PMH includes alcohol  abuse, HTN, CVA, HFmrEF, afib, PE, HLD, anxiety, depression, chronic low back pain.    PT Comments  Patient being bathed by RN on arrival due to incontinence of bowel and bladder. RN recommended chair follow in case pt became incontinent during ambulation. Patient able to walk 300 ft with RW and min assist (to manage RW in turns and avoid obstacles). Noted inpatient therapies with >3 hrs/day not an option due to lack of social supports on discharge. Patient currently could benefit from inpatient therapies with <3 hrs/day.     If plan is discharge home, recommend the following: A lot of help with bathing/dressing/bathroom;Assistance with cooking/housework;Direct supervision/assist for medications management;Direct supervision/assist for financial management;Assist for transportation;Help with stairs or ramp for entrance;Supervision due to cognitive status;A [place] help with walking and/or transfers   Can travel by private vehicle     Yes  Equipment Recommendations  Other (comment) (TBA further)    Recommendations for Other Services       Precautions / Restrictions Precautions Precautions: Fall Recall of Precautions/Restrictions: Impaired Precaution/Restrictions Comments: cortrak Restrictions Weight Bearing Restrictions Per Provider Order: No      Mobility  Bed Mobility               General bed mobility comments: sitting EOB with RN bathing him on arrival    Transfers Overall transfer level: Needs assistance Equipment used: Rolling walker (2 wheels) Transfers: Sit to/from Stand Sit to Stand: Min assist           General transfer comment: stood for RN to provide pericare (pt had been incontinent of bowel and bladder)    Ambulation/Gait Ambulation/Gait assistance: Min assist, +2 safety/equipment Gait Distance (Feet): 300 Feet Assistive device: Rolling walker (2 wheels) Gait Pattern/deviations: Decreased stride length, Trunk flexed, Shuffle, Decreased weight shift to right, Decreased weight shift to left, Step-through pattern Gait velocity: reduced     General Gait Details: Pt ambulates with trunk flexion and short, slow, shuffling steps RW too far ahead and looking down; could not correct   Stairs             Wheelchair Mobility     Tilt Bed    Modified Rankin (Stroke Patients Only)       Balance Overall balance assessment: Needs assistance Sitting-balance support: No upper extremity supported, Feet supported Sitting balance-Leahy Scale: Fair Sitting balance - Comments: static sitting EOB with CGA for safety   Standing balance support: Bilateral upper extremity supported Standing balance-Leahy Scale: Poor                              Communication Communication Communication: No apparent difficulties  Cognition Arousal: Alert Behavior During Therapy: WFL for tasks assessed/performed   PT - Cognitive impairments: Orientation, Awareness, Memory, Attention, Initiation, Sequencing, Problem solving, Safety/Judgement   Orientation impairments: Situation, Place (oriented to  year)                   PT - Cognition Comments: Jennings cues needed for safe use of RW Following commands: Impaired Following commands impaired: Follows one step commands inconsistently, Follows one  step commands with increased time    Cueing Cueing Techniques: Verbal cues, Tactile cues, Visual cues  Exercises      General Comments General comments (skin integrity, edema, etc.): VSS on RA per monitor      Pertinent Vitals/Pain Pain Assessment Pain Assessment: No/denies pain    Home Living                          Prior Function            PT Goals (current goals can now be found in the care plan section) Acute Rehab PT Goals Patient Stated Goal: agreeable to session Time For Goal Achievement: 01/02/24 Potential to Achieve Goals: Good Progress towards PT goals: Progressing toward goals    Frequency    Min 3X/week      PT Plan      Co-evaluation              AM-PAC PT "6 Clicks" Mobility   Outcome Measure  Help needed turning from your back to your side while in a flat bed without using bedrails?: A Broxterman Help needed moving from lying on your back to sitting on the side of a flat bed without using bedrails?: A Lot Help needed moving to and from a bed to a chair (including a wheelchair)?: A Kathol Help needed standing up from a chair using your arms (e.g., wheelchair or bedside chair)?: A Savidge Help needed to walk in hospital room?: A Puglia Help needed climbing 3-5 steps with a railing? : A Lot 6 Click Score: 16    End of Session Equipment Utilized During Treatment: Gait belt Activity Tolerance: Patient tolerated treatment well Patient left: with call bell/phone within reach;with nursing/sitter in room;in chair;with chair alarm set (RN reports no need for mittens) Nurse Communication: Mobility status PT Visit Diagnosis: Unsteadiness on feet (R26.81);Other abnormalities of gait and mobility (R26.89);Muscle weakness (generalized) (M62.81);Difficulty in walking, not elsewhere classified (R26.2)     Time: 1610-9604 PT Time Calculation (min) (ACUTE ONLY): 18 min  Charges:    $Gait Training: 8-22 mins PT General Charges $$ ACUTE PT  VISIT: 1 Visit                      Gayle Kava, PT Acute Rehabilitation Services  Office [phone]    [place]ford Leep 12/21/2023, 3:38 PM

## 2023-12-22 DIAGNOSIS — Z7189 Other specified counseling: Secondary | ICD-10-CM

## 2023-12-22 DIAGNOSIS — E162 Hypoglycemia, unspecified: Secondary | ICD-10-CM | POA: Diagnosis not present

## 2023-12-22 DIAGNOSIS — Z86711 Personal history of pulmonary embolism: Secondary | ICD-10-CM

## 2023-12-22 DIAGNOSIS — Z66 Do not resuscitate: Secondary | ICD-10-CM | POA: Diagnosis not present

## 2023-12-22 DIAGNOSIS — E440 Moderate protein-calorie malnutrition: Secondary | ICD-10-CM

## 2023-12-22 DIAGNOSIS — F10930 Alcohol use, unspecified with withdrawal, uncomplicated: Secondary | ICD-10-CM

## 2023-12-22 DIAGNOSIS — D539 Nutritional anemia, unspecified: Secondary | ICD-10-CM

## 2023-12-22 DIAGNOSIS — Z515 Encounter for palliative care: Secondary | ICD-10-CM

## 2023-12-22 DIAGNOSIS — R410 Disorientation, unspecified: Secondary | ICD-10-CM | POA: Diagnosis not present

## 2023-12-22 LAB — COMPREHENSIVE METABOLIC PANEL WITH GFR
ALT: 65 U/L — ABNORMAL HIGH (ref 0–44)
AST: 58 U/L — ABNORMAL HIGH (ref 15–41)
Albumin: 3 g/dL — ABNORMAL LOW (ref 3.5–5.0)
Alkaline Phosphatase: 100 U/L (ref 38–126)
Anion gap: 9 (ref 5–15)
BUN: 22 mg/dL (ref 8–23)
CO2: 20 mmol/L — ABNORMAL LOW (ref 22–32)
Calcium: 8.5 mg/dL — ABNORMAL LOW (ref 8.9–10.3)
Chloride: 96 mmol/L — ABNORMAL LOW (ref 98–111)
Creatinine, Ser: 0.5 mg/dL — ABNORMAL LOW (ref 0.61–1.24)
GFR, Estimated: >60 mL/min (ref >60)
Glucose, Bld: 84 mg/dL (ref 70–99)
Potassium: 4.4 mmol/L (ref 3.5–5.1)
Sodium: 125 mmol/L — ABNORMAL LOW (ref 135–145)
Total Bilirubin: 2.2 mg/dL — ABNORMAL HIGH (ref 0.0–1.2)
Total Protein: 6.5 g/dL (ref 6.5–8.1)

## 2023-12-22 LAB — BASIC METABOLIC PANEL WITH GFR
Anion gap: 11 (ref 5–15)
BUN: 27 mg/dL — ABNORMAL HIGH (ref 8–23)
CO2: 20 mmol/L — ABNORMAL LOW (ref 22–32)
Calcium: 9 mg/dL (ref 8.9–10.3)
Chloride: 95 mmol/L — ABNORMAL LOW (ref 98–111)
Creatinine, Ser: 0.56 mg/dL — ABNORMAL LOW (ref 0.61–1.24)
GFR, Estimated: >60 mL/min (ref >60)
Glucose, Bld: 88 mg/dL (ref 70–99)
Potassium: 4.4 mmol/L (ref 3.5–5.1)
Sodium: 126 mmol/L — ABNORMAL LOW (ref 135–145)

## 2023-12-22 LAB — CBC
HCT: 28.6 % — ABNORMAL LOW (ref 39.0–52.0)
Hemoglobin: 9.4 g/dL — ABNORMAL LOW (ref 13.0–17.0)
MCH: 29.7 pg (ref 26.0–34.0)
MCHC: 32.9 g/dL (ref 30.0–36.0)
MCV: 90.5 fL (ref 80.0–100.0)
Platelets: 304 10*3/uL (ref 150–400)
RBC: 3.16 MIL/uL — ABNORMAL LOW (ref 4.22–5.81)
RDW: 17 % — ABNORMAL HIGH (ref 11.5–15.5)
WBC: 9.2 10*3/uL (ref 4.0–10.5)
nRBC: 0 % (ref 0.0–0.2)

## 2023-12-22 LAB — GLUCOSE, CAPILLARY
Glucose-Capillary: 109 mg/dL — ABNORMAL HIGH (ref 70–99)
Glucose-Capillary: 110 mg/dL — ABNORMAL HIGH (ref 70–99)
Glucose-Capillary: 110 mg/dL — ABNORMAL HIGH (ref 70–99)
Glucose-Capillary: 120 mg/dL — ABNORMAL HIGH (ref 70–99)
Glucose-Capillary: 84 mg/dL (ref 70–99)
Glucose-Capillary: 85 mg/dL (ref 70–99)

## 2023-12-22 LAB — CORTISOL-AM, BLOOD: Cortisol - AM: 7.9 ug/dL (ref 6.7–22.6)

## 2023-12-22 LAB — MAGNESIUM: Magnesium: 1.5 mg/dL — ABNORMAL LOW (ref 1.7–2.4)

## 2023-12-22 LAB — PHOSPHORUS: Phosphorus: 3.5 mg/dL (ref 2.5–4.6)

## 2023-12-22 MED ORDER — PANTOPRAZOLE SODIUM 40 MG PO TBEC
40.0000 mg | DELAYED_RELEASE_TABLET | Freq: Every day | ORAL | Status: DC
  Administered 2023-12-22 – 2024-01-29 (×39): 40 mg via ORAL
  Filled 2023-12-22 (×40): qty 1

## 2023-12-22 MED ORDER — MAGNESIUM SULFATE 4 GM/100ML IV SOLN
4.0000 g | Freq: Once | INTRAVENOUS | Status: AC
  Administered 2023-12-22: 4 g via INTRAVENOUS
  Filled 2023-12-22: qty 100

## 2023-12-22 MED ORDER — SODIUM CHLORIDE 0.9 % IV SOLN: INTRAVENOUS | Status: AC

## 2023-12-22 NOTE — TOC Progression Note (Signed)
 Transition of Care (TOC) - Progression Note    Patient Details  Name: Anthony A Bouler Jr. MRN: [id number] Date of Birth: 03-31-1956  [facility]) CM/SW Contact  Carmon Christen, LCSWA Phone Number: 12/22/2023, 3:52 PM  Clinical Narrative:      CSW received consult for possible SNF placement at time of discharge. Due to patients current orientation CSW spoke with patients brother Anthony [name]  regarding PT recommendation of SNF placement at time of discharge. Patients reports patient is homeless.Patients brother  expressed understanding of PT recommendation and is agreeable to SNF placement for patient at time of discharge. Patients brother gave CSW permission to fax out initial referral near the [place] area for Snf placement . CSW discussed insurance authorization process. No further questions reported at this time. CSW to continue to follow and assist with discharge planning needs.   Expected Discharge Plan: Skilled Nursing Facility Barriers to Discharge: Continued Medical Work up  Expected Discharge Plan and Services In-house Referral: Clinical Social Work     Living arrangements for the past 2 months: Homeless                                       Social Determinants of Health (SDOH) Interventions SDOH Screenings   Food Insecurity: Patient Unable To Answer (12/09/2023)  Housing: Patient Unable To Answer (12/09/2023)  Transportation Needs: Patient Unable To Answer (12/09/2023)  Utilities: Patient Unable To Answer (12/09/2023)  Depression (PHQ2-9): Medium Risk (10/16/2020)  Social Connections: Unknown (12/22/2023)  Tobacco Use: High Risk (12/08/2023)    Readmission Risk Interventions    08/27/2023   12:21 PM 08/19/2023    9:17 AM 07/12/2023    9:58 AM  Readmission Risk Prevention Plan  Transportation Screening Complete Complete Complete  Medication Review Oceanographer) Complete Complete Complete  PCP or Specialist appointment within 3-5 days of discharge Complete   --  HRI or Home Care Consult Complete Complete Complete  SW Recovery Care/Counseling Consult Complete Complete Complete  Palliative Care Screening Not Applicable Not Applicable Not Applicable  Skilled Nursing Facility Not Applicable Not Applicable Not Applicable

## 2023-12-22 NOTE — Consult Note (Signed)
 WOC Nurse Consult Note: Consult completed remotely utilizing photo images.    Reason for Consult: left lower leg wound Wound type: full thickness wound Pressure Injury POA: NA Measurement: 2 cm x 1 cm per nursing flowsheets Wound bed: red, moist, clean Drainage (amount, consistency, odor) per nursing flow sheets Periwound: macerated  Dressing procedure/placement/frequency:  Clean wound with Vashe (lawson # [id number]), allow to air dry.  Place aquacell Ag Timm Foot [phone]) over wound bed, cover with foam dressing. Change daily.  WOC team will not follow patient at this time, please re-consult if new needs arise.  Gillermo Lack, RN, MSN, [facility] WOC Team
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
## 2023-12-22 NOTE — Progress Notes (Signed)
 Progress Note   Patient: Anthony A Theiss Jr. WGN:[id number] DOB: 28-Mar-1956 DOA: 12/07/2023     14 DOS: the patient was seen and examined on 12/22/2023   Brief hospital course: 68 year old man who presented to [facility] ED 5/20 for SOB. PMHx significant for HTN, aortic atherosclerosis, PAF, PE (previously on Eliquis ), EtOH abuse c/b withdrawal/DTs, tobacco abuse, hiatal hernia (s/p Nissen 2016), chronic low back pain, anxiety who is homeless, recently discharged after opiate withdrawal treatment presented to the ED for evaluation of delirium, hyperglycemia admitted to TRH service for EtOH withdrawal.  While waiting for bed placement patient became intermittently agitated requiring Ativan  per CIWA protocol, transferred to [facility] for intermittent agitation requiring Precedex .  Patient was placed on Precedex , Librium , clonazepam .  Patient is seen by GI, started on stress dose steroids.  Liver, hypoperfusion.  Patient is also seen by advanced heart failure team for biventricular heart failure.  He is transferred to TRH service for further management of hyperglycemia, hyponatremia.  Assessment and Plan: Alcohol  withdrawal, complicated Resolved, Ativan  as needed.   Continue folate, multivitamin, thiamine    Hospital delirium Neuro evaluated, mri nothing acute but advanced atrophy, thinks hospital delirium superimposed on cognitive impairment. Treated here for possible wernicke. May also have hepatic encephalopathy as below Mental status stable.  Physical therapy evaluated him, able to ambulate.   A-fib Rvr this hospitalization, resolved Continue oral amiodarone  Anticoagulation on hold 2/2 retroperitoneal bleed   HFrEF EF 35-40 on tte. Appears euvolemic today Continue spiro   Homelessness- TOC working on safe discharge plan   History PE Was on apixaban , now held 2/2 bleed   MDD hold brexiprazole   Hyponatremia Hold brexiprazole start salt tabs   Febrile illness. monitor, would initiate workup if  recurrent fever or other signs/symptoms of infection   Retroperitoneal hematoma Acute blood loss anemia Diagnosed 5/30, moderate in size. Has received total of 5 units last on 5/31, hgb now stable -Anticoagulation on hold   Cirrhosis Hepatic encephalopathy Resume Lactulose  for 2-3 bowel movements.   Nutrition: Currently getting tube feeds. Is awake and alert Eating 40%. Try nocturnal feeds for now and gradually wean.   Hypoglycemia Stopped d10 Encourage oral diet, tube feeds. Hypoglycemia protocol.       Out of bed to chair. Incentive spirometry. Nursing supportive care. Fall, aspiration precautions. Diet:  Diet Orders (From admission, onward)     Start     Ordered   12/20/23 1444  Diet regular Room service appropriate? Yes with Assist; Fluid consistency: Thin  Diet effective now       Question Answer Comment  Room service appropriate? Yes with Assist   Fluid consistency: Thin      12/20/23 1443           DVT prophylaxis:   Level of care: Progressive   Code Status: Do not attempt resuscitation (DNR) PRE-ARREST INTERVENTIONS DESIRED  Subjective: Patient is seen and examined today morning. He is sitting in chair. Did walk with PT per RN. Eating Rigel better. Able to tolerate oral meds.  Physical Exam: Vitals:   12/22/23 1200 12/22/23 1600 12/22/23 1835 12/22/23 2031  BP: 110/76 103/64 128/83 129/86  Pulse: (!) 105  (!) 103   Resp: 18  18 18   Temp:  (!) 97.3 F (36.3 C) 97.9 F (36.6 C) 98.7 F (37.1 C)  TempSrc:  Axillary Oral Oral  SpO2: 97%  100% 98%  Weight:   64.5 kg   Height:   5\' 6"  (1.676 m)     General -  Elderly weak Caucasian male, no apparent distress HEENT - PERRLA, EOMI, atraumatic head, non tender sinuses. Lung - Clear, basal rales, rhonchi, no wheezes. Heart - S1, S2 heard, no murmurs, rubs, trace pedal edema. Abdomen - Soft, non tender, bowel sounds good Neuro - Alert, awake and oriented, non focal exam. Skin - Warm and  dry.  Data Reviewed:      Latest Ref Rng & Units 12/22/2023    7:23 AM 12/21/2023    3:32 AM 12/20/2023    5:25 AM  CBC  WBC 4.0 - 10.5 K/uL 9.2  9.9  6.4   Hemoglobin 13.0 - 17.0 g/dL 9.4  9.4  8.6   Hematocrit 39.0 - 52.0 % 28.6  28.5  26.9   Platelets 150 - 400 K/uL 304  213  167       Latest Ref Rng & Units 12/22/2023    2:00 PM 12/22/2023    7:23 AM 12/21/2023    3:32 AM  BMP  Glucose 70 - 99 mg/dL 88  84  95   BUN 8 - 23 mg/dL 27  22  19    Creatinine 0.61 - 1.24 mg/dL 1.61  0.96  0.45   Sodium 135 - 145 mmol/L 126  125  126   Potassium 3.5 - 5.1 mmol/L 4.4  4.4  4.6   Chloride 98 - 111 mmol/L 95  96  99   CO2 22 - 32 mmol/L 20  20  19    Calcium  8.9 - 10.3 mg/dL 9.0  8.5  8.5    No results found.  Family Communication: Discussed with patient, understand and agree. All questions answered.  Disposition: Status is: Inpatient Remains inpatient appropriate because: severity of illness  Planned Discharge Destination: Skilled nursing facility     Time spent: 40 minutes  Author: Aisha Hove, MD 12/22/2023 11:20 PM Secure chat 7am to 7pm For on call review [email or web address].

## 2023-12-22 NOTE — Progress Notes (Signed)
 Physical Therapy Treatment Patient Details Name: Anthony A Jeune Jr. MRN: [id number] DOB: [birth year] Today's Date: 12/22/2023   History of Present Illness Pt is a 68 y.o. male who presented 12/06/23 with SOB. Shortly after arriving, pt became agitated and was given IV Ativan . CT head is unremarkable. Patient admitted for EtOH withdrawal, acute delirium, and hypoglycemia. Cortrak placed 5/23.  The day before the patient had presented to ED requesting EtOH detox; patient was deemed to be not actively in EtOH withdrawal and was discharged, however upon discharge patient threatened staff and was escorted off of the premises by [employer]. PMH includes alcohol  abuse, HTN, CVA, HFmrEF, afib, PE, HLD, anxiety, depression, chronic low back pain.    PT Comments  Patient required less physical assist with bed mobility and transfers. Continues to require min assist with ambulation with RW for maneuvering around obstacles and with turns. Patient with very flexed posture and tends to look at his feet when walking. Can partially reverse and look straight ahead with cues, but quickly returns to flexed posture. Final equipment recommendations pending discharge disposition (SNF vs if he must go to shelter or back to the streets).     If plan is discharge home, recommend the following: A lot of help with bathing/dressing/bathroom;Assistance with cooking/housework;Direct supervision/assist for medications management;Direct supervision/assist for financial management;Assist for transportation;Help with stairs or ramp for entrance;Supervision due to cognitive status;A Dohn help with walking and/or transfers   Can travel by private vehicle     Yes  Equipment Recommendations  Other (comment) (?wheelchair vs rollator if discharging to shelter or street)    Recommendations for Other Services       Precautions / Restrictions Precautions Precautions: Fall Recall of Precautions/Restrictions: Impaired Precaution/Restrictions  Comments: cortrak Restrictions Weight Bearing Restrictions Per Provider Order: No     Mobility  Bed Mobility Overal bed mobility: Needs Assistance Bed Mobility: Supine to Sit     Supine to sit: HOB elevated, Contact guard     General bed mobility comments: incr time and effort (pt easily distracted and would stop mid-movement and require cues to get back to task)    Transfers Overall transfer level: Needs assistance Equipment used: Rolling walker (2 wheels) Transfers: Sit to/from Stand Sit to Stand: Contact guard assist           General transfer comment: stood for 90 seconds with bil UE support on RW while RN assessed sacrum and back of legs for wounds    Ambulation/Gait Ambulation/Gait assistance: Min assist, +2 safety/equipment Gait Distance (Feet): 470 Feet Assistive device: Rolling walker (2 wheels) Gait Pattern/deviations: Decreased stride length, Trunk flexed, Shuffle, Step-through pattern Gait velocity: reduced     General Gait Details: Pt ambulates with trunk flexion and looking down; could partially correct with repeated cues; cues to avoid obstacles due to looking down at his feet; assist to maneuver RW around obstacles   Stairs             Wheelchair Mobility     Tilt Bed    Modified Rankin (Stroke Patients Only)       Balance Overall balance assessment: Needs assistance Sitting-balance support: No upper extremity supported, Feet supported Sitting balance-Leahy Scale: Fair     Standing balance support: Bilateral upper extremity supported Standing balance-Leahy Scale: Poor                              Communication Communication Communication: No apparent difficulties  Cognition Arousal:  Alert Behavior During Therapy: WFL for tasks assessed/performed   PT - Cognitive impairments: Orientation, Awareness, Memory, Attention, Initiation, Sequencing, Problem solving, Safety/Judgement   Orientation impairments: Situation  (oriented to year)                   PT - Cognition Comments: mod cues needed for safe use of RW Following commands: Impaired Following commands impaired: Follows one step commands inconsistently, Follows one step commands with increased time    Cueing Cueing Techniques: Verbal cues, Tactile cues, Visual cues  Exercises      General Comments General comments (skin integrity, edema, etc.): HR 95-114 while walking; one standing rest break      Pertinent Vitals/Pain Pain Assessment Pain Assessment: No/denies pain    Home Living                          Prior Function            PT Goals (current goals can now be found in the care plan section) Acute Rehab PT Goals Patient Stated Goal: agreeable to session Time For Goal Achievement: 01/02/24 Potential to Achieve Goals: Good Progress towards PT goals: Progressing toward goals    Frequency    Min 3X/week      PT Plan      Co-evaluation              AM-PAC PT "6 Clicks" Mobility   Outcome Measure  Help needed turning from your back to your side while in a flat bed without using bedrails?: A Turlington Help needed moving from lying on your back to sitting on the side of a flat bed without using bedrails?: A Deneault Help needed moving to and from a bed to a chair (including a wheelchair)?: A Brazie Help needed standing up from a chair using your arms (e.g., wheelchair or bedside chair)?: A Ilic Help needed to walk in hospital room?: A Schuneman Help needed climbing 3-5 steps with a railing? : A Anchondo 6 Click Score: 18    End of Session Equipment Utilized During Treatment: Gait belt Activity Tolerance: Patient tolerated treatment well Patient left: with call bell/phone within reach;with nursing/sitter in room;in chair;with chair alarm set (RN reports no need for mittens) Nurse Communication: Mobility status PT Visit Diagnosis: Unsteadiness on feet (R26.81);Other abnormalities of gait and mobility  (R26.89);Muscle weakness (generalized) (M62.81);Difficulty in walking, not elsewhere classified (R26.2)     Time: 1610-9604 PT Time Calculation (min) (ACUTE ONLY): 22 min  Charges:    $Gait Training: 8-22 mins PT General Charges $$ ACUTE PT VISIT: 1 Visit                      Gayle Kava, PT Acute Rehabilitation Services  Office [phone]    Guilford Leep 12/22/2023, 10:25 AM

## 2023-12-23 DIAGNOSIS — R0602 Shortness of breath: Secondary | ICD-10-CM | POA: Diagnosis not present

## 2023-12-23 DIAGNOSIS — F10931 Alcohol use, unspecified with withdrawal delirium: Secondary | ICD-10-CM | POA: Diagnosis not present

## 2023-12-23 DIAGNOSIS — E440 Moderate protein-calorie malnutrition: Secondary | ICD-10-CM | POA: Diagnosis not present

## 2023-12-23 DIAGNOSIS — Z86711 Personal history of pulmonary embolism: Secondary | ICD-10-CM | POA: Diagnosis not present

## 2023-12-23 DIAGNOSIS — E162 Hypoglycemia, unspecified: Secondary | ICD-10-CM | POA: Diagnosis not present

## 2023-12-23 DIAGNOSIS — Z7189 Other specified counseling: Secondary | ICD-10-CM | POA: Diagnosis not present

## 2023-12-23 DIAGNOSIS — Z515 Encounter for palliative care: Secondary | ICD-10-CM | POA: Diagnosis not present

## 2023-12-23 DIAGNOSIS — E872 Acidosis, unspecified: Secondary | ICD-10-CM | POA: Diagnosis not present

## 2023-12-23 LAB — GLUCOSE, CAPILLARY
Glucose-Capillary: 90 mg/dL (ref 70–99)
Glucose-Capillary: 90 mg/dL (ref 70–99)
Glucose-Capillary: 124 mg/dL — ABNORMAL HIGH (ref 70–99)
Glucose-Capillary: 119 mg/dL — ABNORMAL HIGH (ref 70–99)
Glucose-Capillary: 95 mg/dL (ref 70–99)

## 2023-12-23 LAB — CBC
HCT: 26.6 % — ABNORMAL LOW (ref 39.0–52.0)
Hemoglobin: 8.5 g/dL — ABNORMAL LOW (ref 13.0–17.0)
MCH: 29.6 pg (ref 26.0–34.0)
MCHC: 32 g/dL (ref 30.0–36.0)
MCV: 92.7 fL (ref 80.0–100.0)
Platelets: 358 K/uL (ref 150–400)
RBC: 2.87 MIL/uL — ABNORMAL LOW (ref 4.22–5.81)
RDW: 18 % — ABNORMAL HIGH (ref 11.5–15.5)
WBC: 6.5 K/uL (ref 4.0–10.5)
nRBC: 0 % (ref 0.0–0.2)

## 2023-12-23 LAB — COMPREHENSIVE METABOLIC PANEL WITH GFR
ALT: 62 U/L — ABNORMAL HIGH (ref 0–44)
AST: 67 U/L — ABNORMAL HIGH (ref 15–41)
Albumin: 3 g/dL — ABNORMAL LOW (ref 3.5–5.0)
Alkaline Phosphatase: 109 U/L (ref 38–126)
Anion gap: 9 (ref 5–15)
BUN: 23 mg/dL (ref 8–23)
CO2: 17 mmol/L — ABNORMAL LOW (ref 22–32)
Calcium: 8.4 mg/dL — ABNORMAL LOW (ref 8.9–10.3)
Chloride: 101 mmol/L (ref 98–111)
Creatinine, Ser: 0.48 mg/dL — ABNORMAL LOW (ref 0.61–1.24)
GFR, Estimated: >60 mL/min (ref >60)
Glucose, Bld: 83 mg/dL (ref 70–99)
Potassium: 4.9 mmol/L (ref 3.5–5.1)
Sodium: 127 mmol/L — ABNORMAL LOW (ref 135–145)
Total Bilirubin: 1.7 mg/dL — ABNORMAL HIGH (ref 0.0–1.2)
Total Protein: 6.5 g/dL (ref 6.5–8.1)

## 2023-12-23 LAB — PHOSPHORUS: Phosphorus: 3.3 mg/dL (ref 2.5–4.6)

## 2023-12-23 LAB — MAGNESIUM: Magnesium: 1.6 mg/dL — ABNORMAL LOW (ref 1.7–2.4)

## 2023-12-23 NOTE — Progress Notes (Signed)
 Occupational Therapy Treatment Patient Details Name: Anthony A Strome Jr. MRN: [id number] DOB: Sep 12, 1955 Today's Date: 12/23/2023   History of present illness Pt is a 68 y.o. male who presented 12/06/23 with SOB. Shortly after arriving, pt became agitated and was given IV Ativan . CT head is unremarkable. Patient admitted for EtOH withdrawal, acute delirium, and hypoglycemia. Cortrak placed 5/23.  The day before the patient had presented to ED requesting EtOH detox; patient was deemed to be not actively in EtOH withdrawal and was discharged, however upon discharge patient threatened staff and was escorted off of the premises by [employer]. PMH includes alcohol  abuse, HTN, CVA, HFmrEF, afib, PE, HLD, anxiety, depression, chronic low back pain.   OT comments  Patient with fair progress toward patient focused goal;s.  Poor safety, decreased awareness of deficits and continued confusion impact independence.  Patient has improved with mobility, needing Min A, and is closer to Mod A for lower body ADL.  Continue efforts in the acute setting and Patient will benefit from continued inpatient follow up therapy, <3 hours/day.      If plan is discharge home, recommend the following:  Assist for transportation;A lot of help with bathing/dressing/bathroom;A lot of help with walking and/or transfers   Equipment Recommendations  None recommended by OT    Recommendations for Other Services      Precautions / Restrictions Precautions Precautions: Fall Recall of Precautions/Restrictions: Impaired Precaution/Restrictions Comments: cortrak Restrictions Weight Bearing Restrictions Per Provider Order: No       Mobility Bed Mobility Overal bed mobility: Needs Assistance Bed Mobility: Supine to Sit     Supine to sit: Contact guard Sit to supine: Min assist   General bed mobility comments: Increased time    Transfers Overall transfer level: Needs assistance Equipment used: Rolling walker (2 wheels) Transfers:  Sit to/from Stand Sit to Stand: Contact guard assist                 Balance Overall balance assessment: Needs assistance Sitting-balance support: No upper extremity supported, Feet supported Sitting balance-Leahy Scale: Fair Sitting balance - Comments: decreased safety   Standing balance support: Bilateral upper extremity supported Standing balance-Leahy Scale: Poor                             ADL either performed or assessed with clinical judgement   ADL       Grooming: Wash/dry hands;Wash/dry face;Contact guard assist;Standing               Lower Body Dressing: Minimal assistance;Sit to/from stand   Toilet Transfer: Regular Toilet;Minimal assistance;Ambulation                  Extremity/Trunk Assessment Upper Extremity Assessment Upper Extremity Assessment: Overall WFL for tasks assessed   Lower Extremity Assessment Lower Extremity Assessment: Defer to PT evaluation        Vision Baseline Vision/History: 1 Wears glasses Patient Visual Report: No change from baseline Additional Comments: stating this session that his L eye goes from "white to black."  Unable to really ascertain what he means.   Perception Perception Perception: Not tested   Praxis Praxis Praxis: Not tested   Communication Communication Communication: No apparent difficulties Factors Affecting Communication: Reduced clarity of speech   Cognition Arousal: Alert Behavior During Therapy: WFL for tasks assessed/performed Cognition: Cognition impaired       Memory impairment (select all impairments): Short-term memory, Working memory Attention impairment (select first level of  impairment): Focused attention Executive functioning impairment (select all impairments): Reasoning, Problem solving                   Following commands: Impaired Following commands impaired: Follows one step commands inconsistently, Follows one step commands with increased time       Cueing   Cueing Techniques: Verbal cues, Tactile cues, Visual cues  Exercises      Shoulder Instructions       General Comments      Pertinent Vitals/ Pain       Pain Assessment Pain Assessment: No/denies pain  Home Living                                          Prior Functioning/Environment              Frequency  Min 2X/week        Progress Toward Goals  OT Goals(current goals can now be found in the care plan section)  Progress towards OT goals: Progressing toward goals  Acute Rehab OT Goals OT Goal Formulation: Patient unable to participate in goal setting Time For Goal Achievement: 01/03/24 Potential to Achieve Goals: Fair  Plan      Co-evaluation                 AM-PAC OT "6 Clicks" Daily Activity     Outcome Measure   Help from another person eating meals?: A Mullett Help from another person taking care of personal grooming?: A Leisner Help from another person toileting, which includes using toliet, bedpan, or urinal?: A Evola Help from another person bathing (including washing, rinsing, drying)?: A Lot Help from another person to put on and taking off regular upper body clothing?: A Mooney Help from another person to put on and taking off regular lower body clothing?: A Lot 6 Click Score: 16    End of Session Equipment Utilized During Treatment: Gait belt  OT Visit Diagnosis: Unsteadiness on feet (R26.81);Muscle weakness (generalized) (M62.81)   Activity Tolerance Patient tolerated treatment well   Patient Left in bed;with call bell/phone within reach;with bed alarm set   Nurse Communication Other (comment)        Time: 9604-5409 OT Time Calculation (min): 21 min  Charges: OT General Charges $OT Visit: 1 Visit OT Treatments $Self Care/Home Management : 8-22 mins  12/23/2023  RP, OTR/L  Acute Rehabilitation Services  Office:  [phone]   Anthony [name] 12/23/2023, 8:49 AM

## 2023-12-23 NOTE — TOC Progression Note (Addendum)
 Transition of Care (TOC) - Progression Note    Patient Details  Name: Anthony A Troost Jr. MRN: [id number] Date of Birth: 24-Mar-1956  Transition of Care [facility]) CM/SW Contact  Arron Big, [place] Phone Number: 12/23/2023, 10:47 AM  Clinical Narrative:   Patient has a cortak as of 5/23. Cannot workup for SNF at this time - awaiting cortrak removal. Bedside RN notified during progression.   TOC will continue to follow.    Expected Discharge Plan: Skilled Nursing Facility Barriers to Discharge: Continued Medical Work up  Expected Discharge Plan and Services In-house Referral: Clinical Social Work     Living arrangements for the past 2 months: Homeless                                       Social Determinants of Health (SDOH) Interventions SDOH Screenings   Food Insecurity: Patient Unable To Answer (12/09/2023)  Housing: Patient Unable To Answer (12/09/2023)  Transportation Needs: Patient Unable To Answer (12/09/2023)  Utilities: Patient Unable To Answer (12/09/2023)  Depression (PHQ2-9): Medium Risk (10/16/2020)  Social Connections: Unknown (12/22/2023)  Tobacco Use: High Risk (12/08/2023)    Readmission Risk Interventions    08/27/2023   12:21 PM 08/19/2023    9:17 AM 07/12/2023    9:58 AM  Readmission Risk Prevention Plan  Transportation Screening Complete Complete Complete  Medication Review [employer]) Complete Complete Complete  PCP or Specialist appointment within 3-5 days of discharge Complete  --  HRI or Home Care Consult Complete Complete Complete  SW Recovery Care/Counseling Consult Complete Complete Complete  Palliative Care Screening Not Applicable Not Applicable Not Applicable  Skilled Nursing Facility Not Applicable Not Applicable Not Applicable
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
## 2023-12-23 NOTE — Progress Notes (Signed)
 Progress Note   Patient: Anthony A Robinson Jr. WGN:[id number] DOB: 09-Feb-1956 DOA: 12/07/2023     15 DOS: the patient was seen and examined on 12/23/2023   Brief hospital course: 68 year old man who presented to [facility] ED 5/20 for SOB. PMHx significant for HTN, aortic atherosclerosis, PAF, PE (previously on Eliquis ), EtOH abuse c/b withdrawal/DTs, tobacco abuse, hiatal hernia (s/p Nissen 2016), chronic low back pain, anxiety who is homeless, recently discharged after opiate withdrawal treatment presented to the ED for evaluation of delirium, hyperglycemia admitted to [facility] service for EtOH withdrawal.  While waiting for bed placement patient became intermittently agitated requiring Ativan  per CIWA protocol, transferred to [facility] for intermittent agitation requiring Precedex .  Patient was placed on Precedex , Librium , clonazepam .  Patient is seen by GI, started on stress dose steroids.  Liver, hypoperfusion.  Patient is also seen by advanced heart failure team for biventricular heart failure.  He is transferred to [facility] service for further management of hyperglycemia, hyponatremia.  Assessment and Plan: Alcohol  withdrawal, complicated Resolved, Ativan  as needed.   Continue folate, multivitamin, thiamine    Hospital delirium Neuro evaluated, mri nothing acute but advanced atrophy, thinks hospital delirium superimposed on cognitive impairment. Treated here for possible wernicke. May also have hepatic encephalopathy as below Mental status stable.  Physical therapy evaluated him, able to ambulate.   A-fib Rvr this hospitalization, resolved Continue oral amiodarone  Anticoagulation on hold 2/2 retroperitoneal bleed   HFrEF EF 35-40 on tte. Appears euvolemic today Continue spiro   Homelessness- TOC working on safe discharge plan   History PE Was on apixaban , now held 2/2 bleed   MDD hold brexiprazole   Hyponatremia Hold brexiprazole start salt tabs   Febrile illness. monitor, would initiate workup if  recurrent fever or other signs/symptoms of infection   Retroperitoneal hematoma Acute blood loss anemia Diagnosed 5/30, moderate in size. Has received total of 5 units last on 5/31, hgb now stable Anticoagulation on hold   Cirrhosis Hepatic encephalopathy Resume Lactulose  for 2-3 bowel movements.   Nutrition: Currently getting tube feeds. Is awake and alert Eating 40%. Try nocturnal feeds for now and gradually wean.   Hypoglycemia Stopped d10 Encourage oral diet, tube feeds. Hypoglycemia protocol.      Palliative discussed with his brother regarding SNF placement. Out of bed to chair. Incentive spirometry. Nursing supportive care. Fall, aspiration precautions. Diet:  Diet Orders (From admission, onward)     Start     Ordered   12/23/23 1657  DIET SOFT Room service appropriate? Yes; Fluid consistency: Thin  Diet effective now       Question Answer Comment  Room service appropriate? Yes   Fluid consistency: Thin      12/23/23 1656           DVT prophylaxis:   Level of care: Progressive   Code Status: Do not attempt resuscitation (DNR) PRE-ARREST INTERVENTIONS DESIRED  Subjective: Patient is seen and examined today morning. He is lying in bed, did not get out of bed. Able to answer appropriately.  Physical Exam: Vitals:   12/23/23 0726 12/23/23 1115 12/23/23 1200 12/23/23 1534  BP: 119/83 116/62  126/85  Pulse: 97 (!) 46  (!) 101  Resp: 17 18 15 17   Temp: 98.6 F (37 C) 98 F (36.7 C)  98.6 F (37 C)  TempSrc: Oral Oral  Oral  SpO2: 99% 96%  99%  Weight:      Height:        General - Elderly weak Caucasian male, no  apparent distress HEENT - PERRLA, EOMI, atraumatic head, non tender sinuses. Lung - Clear, basal rales, rhonchi, no wheezes. Heart - S1, S2 heard, no murmurs, rubs, trace pedal edema. Abdomen - Soft, non tender, bowel sounds good Neuro - Alert, awake and oriented, non focal exam. Skin - Warm and dry.  Data Reviewed:      Latest  Ref Rng & Units 12/23/2023   10:38 AM 12/22/2023    7:23 AM 12/21/2023    3:32 AM  CBC  WBC 4.0 - 10.5 K/uL 6.5  9.2  9.9   Hemoglobin 13.0 - 17.0 g/dL 8.5  9.4  9.4   Hematocrit 39.0 - 52.0 % 26.6  28.6  28.5   Platelets 150 - 400 K/uL 358  304  213       Latest Ref Rng & Units 12/23/2023   10:38 AM 12/22/2023    2:00 PM 12/22/2023    7:23 AM  BMP  Glucose 70 - 99 mg/dL 83  88  84   BUN 8 - 23 mg/dL 23  27  22    Creatinine 0.61 - 1.24 mg/dL 4.09  8.11  9.14   Sodium 135 - 145 mmol/L 127  126  125   Potassium 3.5 - 5.1 mmol/L 4.9  4.4  4.4   Chloride 98 - 111 mmol/L 101  95  96   CO2 22 - 32 mmol/L 17  20  20    Calcium  8.9 - 10.3 mg/dL 8.4  9.0  8.5    No results found.  Disposition: Status is: Inpatient Remains inpatient appropriate because: severity of illness  Planned Discharge Destination: Skilled nursing facility     Time spent: 39 minutes  Author: Aisha Hove, MD 12/23/2023 6:03 PM Secure chat 7am to 7pm For on call review [email or web address].

## 2023-12-24 DIAGNOSIS — E440 Moderate protein-calorie malnutrition: Secondary | ICD-10-CM | POA: Diagnosis not present

## 2023-12-24 DIAGNOSIS — E872 Acidosis, unspecified: Secondary | ICD-10-CM | POA: Diagnosis not present

## 2023-12-24 DIAGNOSIS — F10931 Alcohol use, unspecified with withdrawal delirium: Secondary | ICD-10-CM | POA: Diagnosis not present

## 2023-12-24 DIAGNOSIS — Z86711 Personal history of pulmonary embolism: Secondary | ICD-10-CM | POA: Diagnosis not present

## 2023-12-24 LAB — COMPREHENSIVE METABOLIC PANEL WITH GFR
ALT: 60 U/L — ABNORMAL HIGH (ref 0–44)
AST: 60 U/L — ABNORMAL HIGH (ref 15–41)
Albumin: 3 g/dL — ABNORMAL LOW (ref 3.5–5.0)
Alkaline Phosphatase: 113 U/L (ref 38–126)
Anion gap: 9 (ref 5–15)
BUN: 20 mg/dL (ref 8–23)
CO2: 19 mmol/L — ABNORMAL LOW (ref 22–32)
Calcium: 8.5 mg/dL — ABNORMAL LOW (ref 8.9–10.3)
Chloride: 98 mmol/L (ref 98–111)
Creatinine, Ser: 0.55 mg/dL — ABNORMAL LOW (ref 0.61–1.24)
GFR, Estimated: >60 mL/min (ref >60)
Glucose, Bld: 103 mg/dL — ABNORMAL HIGH (ref 70–99)
Potassium: 4.4 mmol/L (ref 3.5–5.1)
Sodium: 126 mmol/L — ABNORMAL LOW (ref 135–145)
Total Bilirubin: 1.6 mg/dL — ABNORMAL HIGH (ref 0.0–1.2)
Total Protein: 6.7 g/dL (ref 6.5–8.1)

## 2023-12-24 LAB — GLUCOSE, CAPILLARY
Glucose-Capillary: 102 mg/dL — ABNORMAL HIGH (ref 70–99)
Glucose-Capillary: 113 mg/dL — ABNORMAL HIGH (ref 70–99)
Glucose-Capillary: 156 mg/dL — ABNORMAL HIGH (ref 70–99)
Glucose-Capillary: 61 mg/dL — ABNORMAL LOW (ref 70–99)
Glucose-Capillary: 72 mg/dL (ref 70–99)
Glucose-Capillary: 81 mg/dL (ref 70–99)
Glucose-Capillary: 83 mg/dL (ref 70–99)

## 2023-12-24 LAB — CBC
HCT: 27.1 % — ABNORMAL LOW (ref 39.0–52.0)
Hemoglobin: 8.7 g/dL — ABNORMAL LOW (ref 13.0–17.0)
MCH: 29.3 pg (ref 26.0–34.0)
MCHC: 32.1 g/dL (ref 30.0–36.0)
MCV: 91.2 fL (ref 80.0–100.0)
Platelets: 395 10*3/uL (ref 150–400)
RBC: 2.97 MIL/uL — ABNORMAL LOW (ref 4.22–5.81)
RDW: 18.1 % — ABNORMAL HIGH (ref 11.5–15.5)
WBC: 4.9 10*3/uL (ref 4.0–10.5)
nRBC: 0 % (ref 0.0–0.2)

## 2023-12-24 LAB — PHOSPHORUS: Phosphorus: 3.7 mg/dL (ref 2.5–4.6)

## 2023-12-24 LAB — MAGNESIUM: Magnesium: 1.4 mg/dL — ABNORMAL LOW (ref 1.7–2.4)

## 2023-12-24 MED ORDER — DEXTROSE 50 % IV SOLN
12.5000 g | INTRAVENOUS | Status: AC
  Administered 2023-12-24: 12.5 g via INTRAVENOUS
  Filled 2023-12-24: qty 50

## 2023-12-24 MED ORDER — MAGNESIUM OXIDE -MG SUPPLEMENT 400 (240 MG) MG PO TABS
400.0000 mg | ORAL_TABLET | Freq: Every day | ORAL | Status: DC
  Administered 2023-12-24 – 2024-01-05 (×13): 400 mg via ORAL
  Filled 2023-12-24 (×13): qty 1

## 2023-12-24 MED ORDER — OSMOLITE 1.5 CAL PO LIQD
900.0000 mL | ORAL | Status: DC
  Administered 2023-12-24 – 2023-12-27 (×4): 900 mL
  Filled 2023-12-24: qty 1000
  Filled 2023-12-24: qty 948
  Filled 2023-12-24 (×10): qty 1000

## 2023-12-24 NOTE — Progress Notes (Signed)
 Nutrition Follow-up  DOCUMENTATION CODES:   Non-severe (moderate) malnutrition in context of chronic illness  INTERVENTION:   -Continue feeding assistance with meals -Continue Magic cup TID with meals, each supplement provides 290 kcal and 9 grams of protein  -Continue Ensure Enlive po TID, each supplement provides 350 kcal and 20 grams of protein -Continue nocturnal feeds:   D/c Vital 1.5  Osmolite 1.5 @ 75 ml/hr   60 ml Prosource TF daily  Tube feeding regimen provides 1430 kcal (71% of needs), 76 grams of protein, and 686 ml of H2O.    -Case discussed with RN, MD, and palliative care; may need to consider permanent feeding access (ex PEG) as pt continues to be unable to consume adequate calories PO  NUTRITION DIAGNOSIS:   Moderate Malnutrition related to social / environmental circumstances (ETOH use, homeless) as evidenced by moderate fat depletion, moderate muscle depletion.  Ongoing   GOAL:   Patient will meet greater than or equal to 90% of their needs  Progressing   MONITOR:   Diet advancement, Labs, Weight trends, TF tolerance  REASON FOR ASSESSMENT:   Consult Assessment of nutrition requirement/status  ASSESSMENT:   68 yo male admitted with EtOH withdrawal with DTs, lactic acidosis, concern for possible sepsis-?biliary source. PMH includes EtOH abuse with recurrent admissions for DTs, tobacco use, hiatal hernia s/p nissen (2016), HTN. Pt is homeless  5/21: CT and US  - findings of gallstones/sludge and GBW thickening; fatty liver, non cirrhotic; unremarkable pancreas 5/23: HIDA- normal GB filling but no emptying in the small bowel; Cortrak placed 5/30: CTA- retroperitoneal hematoma within the R iliopsoas musculature; mild to moderate mass effect on adjacent pelvic organs; diffuse anasarca and free fluid within the abdomen and pelvis 6/2- s/p BSE- regular diet with thin liquids  Reviewed I/O's: 32.2 L x 24 hours and -5.7 L since 12/10/23  UOP: 1.1 L x 24  hours  Pt unavailable at time of visit. Attempted to speak with pt via call to hospital room phone, however, unable to reach.   Per [facility] notes, pt with full thickness lt lower leg wound (12/22/23).   Per chart review, pt with some confusion, likely hospital acquired delirium (per neurology). Per palliative care, pt likely does not have capacity to make medical decisions.   Noted hypoglycemic event overnight. Case discussed with RN, who reports pt is tolerating oral intake and nocturnal feeds via cortrak. RN estimated oral intake is 25-50% of meals. Pt reports ne episode of vomiting last night, however, no vomiting on the shift. Per TOC, cortrak prohibiting discharge to SNF. Case discussed with RN, palliative care, and MD; pt still not consuming adequate calories to remove cortrak tube. May need to consider permanent feeding access (ex PEG). Per MD, plan to see if oral intake improves prior to making that decision. Palliative care also concerned about discharge disposition with PEG, as pt is unhoused at baseline.   Reviewed wt hx; noted pt has experienced a 14.4% wt loss over the past week, which is significant for time frame. Suspect fluid losses may be contributing to weight loss. Pt is close to admission weight.   Per TOC notes, plan for SNF placement.   Medications reviewed and include folic acid , magnesium  oxide, protonix , and thiamine .   Labs reviewed: Na: 126, CBGS: 61-156 (inpatient orders for glycemic control are none).    Diet Order:   Diet Order             DIET SOFT Room service appropriate? Yes; Fluid consistency:  Thin  Diet effective now                   EDUCATION NEEDS:   No education needs have been identified at this time  Skin:  Skin Assessment: Skin Integrity Issues: Skin Integrity Issues:: Other (Comment) Stage II: L tibial Other: IAD to rectum, full thickness lt lower leg wound  Last BM:  12/23/23  Height:   Ht Readings from Last 1 Encounters:  12/22/23  5\' 6"  (1.676 m)    Weight:   Wt Readings from Last 1 Encounters:  12/24/23 64.2 kg    Ideal Body Weight:  70 kg  BMI:  Body mass index is 22.84 kg/m.  Estimated Nutritional Needs:   Kcal:  1900-2100  Protein:  95-110g  Fluid:  >/=1.9L    Herschel Lords, RD, LDN, CDCES Registered Dietitian III Certified Diabetes Care and Education Specialist If unable to reach this RD, please use "RD Inpatient" group chat on secure chat between hours of 8am-4 pm daily

## 2023-12-24 NOTE — TOC Progression Note (Signed)
 Transition of Care (TOC) - Progression Note    Patient Details  Name: Anthony A Mcclard Jr. MRN: [id number] Date of Birth: 03-Aug-1955  Transition of Care [facility]) CM/SW Contact  Arron Big, [place] Phone Number: 12/24/2023, 10:34 AM  Clinical Narrative:   Patient still has cortrak and is not medically ready for SNF at this time.   TOC will continue to follow.      Expected Discharge Plan: Skilled Nursing Facility Barriers to Discharge: Continued Medical Work up  Expected Discharge Plan and Services In-house Referral: Clinical Social Work     Living arrangements for the past 2 months: Homeless                                       Social Determinants of Health (SDOH) Interventions SDOH Screenings   Food Insecurity: Patient Unable To Answer (12/09/2023)  Housing: Patient Unable To Answer (12/09/2023)  Transportation Needs: Patient Unable To Answer (12/09/2023)  Utilities: Patient Unable To Answer (12/09/2023)  Depression (PHQ2-9): Medium Risk (10/16/2020)  Social Connections: Unknown (12/22/2023)  Tobacco Use: High Risk (12/08/2023)    Readmission Risk Interventions    08/27/2023   12:21 PM 08/19/2023    9:17 AM 07/12/2023    9:58 AM  Readmission Risk Prevention Plan  Transportation Screening Complete Complete Complete  Medication Review Oceanographer) Complete Complete Complete  PCP or Specialist appointment within 3-5 days of discharge Complete  --  HRI or Home Care Consult Complete Complete Complete  SW Recovery Care/Counseling Consult Complete Complete Complete  Palliative Care Screening Not Applicable Not Applicable Not Applicable  Skilled Nursing Facility Not Applicable Not Applicable Not Applicable

## 2023-12-24 NOTE — Progress Notes (Signed)
 1200: Patient found walking in hallway, stopped by a rounding provider on floor. This nurse assisted patient back to bed. Patient was agreeable. Pt c/o difficulty breathing. SPO2 reading <80, though fingers cold and may be inaccurate. Pt started on 2L [place] for comfort. SPO2 now 100%.

## 2023-12-24 NOTE — Progress Notes (Signed)
 Physical Therapy Treatment Patient Details Name: Anthony [name] Jr. MRN: [id number] DOB: April 27, 1956 Today's Date: 12/24/2023   History of Present Illness Pt is a 68 y.o. male who presented 12/06/23 with SOB. Shortly after arriving, pt became agitated and was given IV Ativan . CT head is unremarkable. Patient admitted for EtOH withdrawal, acute delirium, and hypoglycemia. Cortrak placed 5/23.  The day before the patient had presented to ED requesting EtOH detox; patient was deemed to be not actively in EtOH withdrawal and was discharged, however upon discharge patient threatened staff and was escorted off of the premises by [employer]. PMH includes alcohol  abuse, HTN, CVA, HFmrEF, afib, PE, HLD, anxiety, depression, chronic low back pain.    PT Comments  Pt greeted supine in bed, pleasant and agreeable to PT session. He had tangential speech and was easily distracted requiring frequent re-direction and re-orientation to task. Pt demonstrated poor safety awareness and required CGA-minA throughout functional mobility. He ambulated ~237ft using RW and c/o SOB. Pt desaturated to 78-82% SpO2 on RA during gait. Donned 1L and he initially recovered, but desaturated again to 85% SpO2 while mobilizing. Increased to 2L and SpO2 >88% throughout activity. At rest pt was able to return to RA with SpO2 100%. Will continue to follow acutely and advance appropriately.    If plan is discharge home, recommend the following: A lot of help with bathing/dressing/bathroom;Assistance with cooking/housework;Direct supervision/assist for medications management;Direct supervision/assist for financial management;Assist for transportation;Help with stairs or ramp for entrance;Supervision due to cognitive status;A [place] help with walking and/or transfers   Can travel by private vehicle     Yes  Equipment Recommendations  Rollator (4 wheels);Wheelchair (measurements PT);Wheelchair cushion (measurements PT);BSC/3in1    Recommendations for  Other Services       Precautions / Restrictions Precautions Precautions: Fall Recall of Precautions/Restrictions: Impaired Precaution/Restrictions Comments: cortrak; watch SpO2 Restrictions Weight Bearing Restrictions Per Provider Order: No     Mobility  Bed Mobility Overal bed mobility: Needs Assistance Bed Mobility: Supine to Sit, Sit to Supine     Supine to sit: Min assist, HOB elevated Sit to supine: Contact guard assist, HOB elevated   General bed mobility comments: Pt sat up on R side of bed with increased time. PT had to facilitate bringing BLE off EOB, initially providing minA before pt took over to complete the activity. He reached to pull on PT to sit up and needed assist on his posterior trunk to elevate into upright posture. Pt scooted fwd til feet were flat on ground. He returned to be without physical assist, supervision for safety.    Transfers Overall transfer level: Needs assistance Equipment used: Rolling walker (2 wheels) Transfers: Sit to/from Stand Sit to Stand: Contact guard assist           General transfer comment: Pt stood from lowest bed height. Cued proper hand positioning. He powered up with CGA to standing. Good eccentric control with sitting.    Ambulation/Gait Ambulation/Gait assistance: Contact guard assist, Min assist Gait Distance (Feet): 200 Feet Assistive device: Rolling walker (2 wheels) Gait Pattern/deviations: Step-through pattern, Decreased stride length, Trunk flexed Gait velocity: reduced Gait velocity interpretation: <1.8 ft/sec, indicate of risk for recurrent falls   General Gait Details: Pt ambulates with short slow steps, fwd lean, and fixed gaze on the ground. VC/TC to correct pt's posture and have him look ahead down the hallway, glancing down at his feet occasionally to navigate obstacles. Pt c/o SOB during gait. Cued PLB technique. As he fatigued pt  became more unsteady and required increase assit for  stability.   Stairs             Wheelchair Mobility     Tilt Bed    Modified Rankin (Stroke Patients Only)       Balance Overall balance assessment: Needs assistance Sitting-balance support: No upper extremity supported, Feet supported Sitting balance-Leahy Scale: Fair Sitting balance - Comments: Pt sat EOB with supervision for safety.   Standing balance support: Bilateral upper extremity supported, During functional activity, Reliant on assistive device for balance Standing balance-Leahy Scale: Poor Standing balance comment: Pt deoendent on RW for stability and CGA for safety.                            Communication Communication Communication: No apparent difficulties  Cognition Arousal: Alert Behavior During Therapy: WFL for tasks assessed/performed   PT - Cognitive impairments: Orientation, Awareness, Memory, Attention, Initiation, Sequencing, Problem solving, Safety/Judgement   Orientation impairments: Situation, Time                   PT - Cognition Comments: Pt pleasantly confused throughout session. Easily distracted and tangential in speech, able to be redirected with moderate cues. Following commands: Impaired Following commands impaired: Follows one step commands inconsistently, Follows one step commands with increased time    Cueing Cueing Techniques: Verbal cues, Tactile cues, Visual cues  Exercises      General Comments General comments (skin integrity, edema, etc.): Pt greeted on RA with SpO2 100%. Pt desaturated to 78-82% SpO2 on RA. Donned 1L and pt desaturated to 85% SpO2 during gait, increased to 2L and pt's SpO2 >88% throughout. Weaned back to RA at rest with SpO2 100%.      Pertinent Vitals/Pain Pain Assessment Pain Assessment: Faces Faces Pain Scale: No hurt Pain Intervention(s): Monitored during session    Home Living                          Prior Function            PT Goals (current goals can  now be found in the care plan section) Acute Rehab PT Goals Patient Stated Goal: Get better Progress towards PT goals: Progressing toward goals    Frequency    Min 3X/week      PT Plan      Co-evaluation              AM-PAC PT "6 Clicks" Mobility   Outcome Measure  Help needed turning from your back to your side while in a flat bed without using bedrails?: A Courts Help needed moving from lying on your back to sitting on the side of a flat bed without using bedrails?: A Mccanless Help needed moving to and from a bed to a chair (including a wheelchair)?: A Guagliardo Help needed standing up from a chair using your arms (e.g., wheelchair or bedside chair)?: A Human Help needed to walk in hospital room?: A Stroot Help needed climbing 3-5 steps with a railing? : A Doorn 6 Click Score: 18    End of Session Equipment Utilized During Treatment: Gait belt;Oxygen Activity Tolerance: Patient tolerated treatment well Patient left: in bed;with call bell/phone within reach;with bed alarm set Nurse Communication: Mobility status PT Visit Diagnosis: Unsteadiness on feet (R26.81);Other abnormalities of gait and mobility (R26.89);Muscle weakness (generalized) (M62.81);Difficulty in walking, not elsewhere classified (R26.2)     Time:  1610-9604 PT Time Calculation (min) (ACUTE ONLY): 24 min  Charges:    $Gait Training: 23-37 mins PT General Charges $$ ACUTE PT VISIT: 1 Visit                     Glenford Lanes, PT, DPT Acute Rehabilitation Services Office: [phone] Secure Chat Preferred  Riva Chester 12/24/2023, 4:52 PM

## 2023-12-24 NOTE — Progress Notes (Signed)
 Hypoglycemic Event  CBG: 61   Treatment: D50 25 mL (12.5 gm)  Symptoms: Pale and Confused  Follow-up CBG: Time:1315 CBG Result:156  Possible Reasons for Event: Inadequate meal intake

## 2023-12-24 NOTE — Progress Notes (Signed)
 Progress Note   Patient: Anthony [name]. RUE:[id number] DOB: [birth year] DOA: 12/07/2023     16 DOS: the patient was seen and examined on 12/24/2023   Brief hospital course: 68 year old man who presented to [facility] ED 5/20 for SOB. PMHx significant for HTN, aortic atherosclerosis, PAF, PE (previously on Eliquis ), EtOH abuse c/b withdrawal/DTs, tobacco abuse, hiatal hernia (s/p Nissen 2016), chronic low back pain, anxiety who is homeless, recently discharged after opiate withdrawal treatment presented to the ED for evaluation of delirium, hyperglycemia admitted to TRH service for EtOH withdrawal.  While waiting for bed placement patient became intermittently agitated requiring Ativan  per CIWA protocol, transferred to [facility] for intermittent agitation requiring Precedex .  Patient was placed on Precedex , Librium , clonazepam .  Patient is seen by GI, started on stress dose steroids.  Liver, hypoperfusion.  Patient is also seen by advanced heart failure team for biventricular heart failure.  He is transferred to TRH service for further management of hyperglycemia, hyponatremia.  Assessment and Plan: Alcohol  withdrawal, complicated Resolved, Ativan  as needed.   Continue folate, multivitamin, thiamine    Hospital delirium Neuro evaluated, mri nothing acute but advanced atrophy, thinks hospital delirium superimposed on cognitive impairment. Treated here for possible wernicke. May also have hepatic encephalopathy as below Mental status stable.  Physical therapy evaluated him, able to ambulate.   A-fib Rvr this hospitalization, resolved Continue oral amiodarone  Anticoagulation on hold 2/2 retroperitoneal bleed   HFrEF EF 35-40 on tte. Appears euvolemic today Continue spiro   Homelessness- TOC working on safe discharge plan   History PE Was on apixaban , now held 2/2 bleed   MDD hold brexiprazole   Hyponatremia- Due to poor oral intake. Hold brexiprazole, aldactone . Encourage oral fluids, continue  salt tabs   Febrile illness. Resolved. No further work up needed.   Retroperitoneal hematoma Acute blood loss anemia Diagnosed 5/30, moderate in size. Has received total of 5 units last on 5/31, hgb now stable Anticoagulation on hold   Cirrhosis Hepatic encephalopathy Resume Lactulose  for 2-3 bowel movements.   Nutrition: Currently getting tube feeds. Is awake and alert Eating 40%. Try nocturnal feeds for now and gradually wean.   Hypoglycemia Sugars on and off low. Encourage oral diet, tube feeds. Hypoglycemia protocol.      Palliative discussed with his brother regarding SNF placement. Out of bed to chair. Incentive spirometry. Nursing supportive care. Fall, aspiration precautions. Diet:  Diet Orders (From admission, onward)     Start     Ordered   12/23/23 1657  DIET SOFT Room service appropriate? Yes; Fluid consistency: Thin  Diet effective now       Question Answer Comment  Room service appropriate? Yes   Fluid consistency: Thin      12/23/23 1656           DVT prophylaxis:   Level of care: Progressive   Code Status: Do not attempt resuscitation (DNR) PRE-ARREST INTERVENTIONS DESIRED  Subjective: Patient is seen and examined today morning. He denies any complaints, but feels awful. Able to answer appropriately.  Physical Exam: Vitals:   12/24/23 0056 12/24/23 0436 12/24/23 0815 12/24/23 1223  BP: 118/88 119/80 (!) 149/112 (!) 130/91  Pulse: (!) 101 (!) 102 (!) 103 93  Resp: 15 17    Temp: 98.9 F (37.2 C) 99.2 F (37.3 C) 98.4 F (36.9 C) 97.9 F (36.6 C)  TempSrc: Oral Oral Oral Oral  SpO2: 96% 100% 98% 100%  Weight:  64.2 kg    Height:  General - Elderly weak Caucasian male, no apparent distress HEENT - PERRLA, EOMI, atraumatic head, non tender sinuses. Lung - Clear, basal rales, rhonchi, no wheezes. Heart - S1, S2 heard, no murmurs, rubs, trace pedal edema. Abdomen - Soft, non tender, bowel sounds good Neuro - Alert, awake  and oriented, non focal exam. Skin - Warm and dry.  Data Reviewed:      Latest Ref Rng & Units 12/24/2023    2:58 AM 12/23/2023   10:38 AM 12/22/2023    7:23 AM  CBC  WBC 4.0 - 10.5 K/uL 4.9  6.5  9.2   Hemoglobin 13.0 - 17.0 g/dL 8.7  8.5  9.4   Hematocrit 39.0 - 52.0 % 27.1  26.6  28.6   Platelets 150 - 400 K/uL 395  358  304       Latest Ref Rng & Units 12/24/2023    2:58 AM 12/23/2023   10:38 AM 12/22/2023    2:00 PM  BMP  Glucose 70 - 99 mg/dL 846  83  88   BUN 8 - 23 mg/dL 20  23  27    Creatinine 0.61 - 1.24 mg/dL 9.62  9.52  8.41   Sodium 135 - 145 mmol/L 126  127  126   Potassium 3.5 - 5.1 mmol/L 4.4  4.9  4.4   Chloride 98 - 111 mmol/L 98  101  95   CO2 22 - 32 mmol/L 19  17  20    Calcium  8.9 - 10.3 mg/dL 8.5  8.4  9.0    No results found.  Disposition: Status is: Inpatient Remains inpatient appropriate because: severity of illness  Planned Discharge Destination: Skilled nursing facility     Time spent: 38 minutes  Author: Aisha Hove, MD 12/24/2023 1:56 PM Secure chat 7am to 7pm For on call review [email or web address].

## 2023-12-24 NOTE — Progress Notes (Signed)
 SATURATION QUALIFICATIONS: (This note is used to comply with regulatory documentation for home oxygen)  Patient Saturations on Room Air at Rest = 100%  Patient Saturations on Room Air while Ambulating = 80%  Patient Saturations on 2 Liters of oxygen while Ambulating = 89%  Please briefly explain why patient needs home oxygen: Pt desaturated on RA with mobility and was symptomatic c/o SOB. He requires 2L O2 via [place] to maintain SpO2 >88% with activity.   Anthony [name], PT, DPT Acute Rehabilitation Services Office: [phone] Secure Chat Preferred

## 2023-12-24 NOTE — Plan of Care (Signed)
 ?  Problem: Education: ?Goal: Knowledge of General Education information will improve ?Description: Including pain rating scale, medication(s)/side effects and non-pharmacologic comfort measures ?Outcome: Progressing ?  ?Problem: Health Behavior/Discharge Planning: ?Goal: Ability to manage health-related needs will improve ?Outcome: Progressing ?  ?Problem: Clinical Measurements: ?Goal: Ability to maintain clinical measurements within normal limits will improve ?Outcome: Progressing ?Goal: Diagnostic test results will improve ?Outcome: Progressing ?Goal: Cardiovascular complication will be avoided ?Outcome: Progressing ?  ?

## 2023-12-25 DIAGNOSIS — Z86711 Personal history of pulmonary embolism: Secondary | ICD-10-CM | POA: Diagnosis not present

## 2023-12-25 DIAGNOSIS — E872 Acidosis, unspecified: Secondary | ICD-10-CM | POA: Diagnosis not present

## 2023-12-25 DIAGNOSIS — E440 Moderate protein-calorie malnutrition: Secondary | ICD-10-CM | POA: Diagnosis not present

## 2023-12-25 DIAGNOSIS — F10931 Alcohol use, unspecified with withdrawal delirium: Secondary | ICD-10-CM | POA: Diagnosis not present

## 2023-12-25 LAB — CBC
HCT: 29.7 % — ABNORMAL LOW (ref 39.0–52.0)
Hemoglobin: 9.7 g/dL — ABNORMAL LOW (ref 13.0–17.0)
MCH: 29.8 pg (ref 26.0–34.0)
MCHC: 32.7 g/dL (ref 30.0–36.0)
MCV: 91.1 fL (ref 80.0–100.0)
Platelets: 467 10*3/uL — ABNORMAL HIGH (ref 150–400)
RBC: 3.26 MIL/uL — ABNORMAL LOW (ref 4.22–5.81)
RDW: 18.4 % — ABNORMAL HIGH (ref 11.5–15.5)
WBC: 4.5 10*3/uL (ref 4.0–10.5)
nRBC: 0 % (ref 0.0–0.2)

## 2023-12-25 LAB — GLUCOSE, CAPILLARY
Glucose-Capillary: 110 mg/dL — ABNORMAL HIGH (ref 70–99)
Glucose-Capillary: 115 mg/dL — ABNORMAL HIGH (ref 70–99)
Glucose-Capillary: 116 mg/dL — ABNORMAL HIGH (ref 70–99)
Glucose-Capillary: 129 mg/dL — ABNORMAL HIGH (ref 70–99)
Glucose-Capillary: 75 mg/dL (ref 70–99)
Glucose-Capillary: 83 mg/dL (ref 70–99)
Glucose-Capillary: >600 mg/dL (ref 70–99)

## 2023-12-25 LAB — GLUCOSE, RANDOM: Glucose, Bld: 101 mg/dL — ABNORMAL HIGH (ref 70–99)

## 2023-12-25 MED ORDER — INSULIN ASPART 100 UNIT/ML IJ SOLN
20.0000 [IU] | Freq: Once | INTRAMUSCULAR | Status: AC
  Administered 2023-12-25: 20 [IU] via SUBCUTANEOUS

## 2023-12-25 NOTE — Plan of Care (Signed)
 Pt needs reinforcement for teaching of medical regimen

## 2023-12-25 NOTE — Progress Notes (Signed)
 Progress Note   Patient: Anthony A Thibeaux Jr. [id number] DOB: 01-29-1956 DOA: 12/07/2023     17 DOS: the patient was seen and examined on 12/25/2023   Brief hospital course: 68 year old man who presented to [facility] ED 5/20 for SOB. PMHx significant for HTN, aortic atherosclerosis, PAF, PE (previously on Eliquis ), EtOH abuse c/b withdrawal/DTs, tobacco abuse, hiatal hernia (s/p Nissen 2016), chronic low back pain, anxiety who is homeless, recently discharged after opiate withdrawal treatment presented to the ED for evaluation of delirium, hyperglycemia admitted to TRH service for EtOH withdrawal.  While waiting for bed placement patient became intermittently agitated requiring Ativan  per CIWA protocol, transferred to [facility] for intermittent agitation requiring Precedex .  Patient was placed on Precedex , Librium , clonazepam .  Patient is seen by GI, started on stress dose steroids.  Liver, hypoperfusion.  Patient is also seen by advanced heart failure team for biventricular heart failure.  He is transferred to TRH service for further management of hyperglycemia, hyponatremia.  Assessment and Plan: Alcohol  withdrawal, complicated Resolved, Ativan  as needed.   Continue folate, multivitamin, thiamine    Hospital delirium MRI brain nothing acute but advanced atrophy, Neuro thinks hospital delirium superimposed on cognitive impairment. Treated here for possible wernicke. May also have hepatic encephalopathy as below Mental status stable.  Physical therapy evaluated him, able to ambulate.   A-fib Rvr this hospitalization, resolved Continue oral amiodarone  Anticoagulation on hold 2/2 retroperitoneal bleed   HFrEF EF 35-40 on tte. Appears euvolemic today Continue spiro   Homelessness- TOC working on safe discharge plan   History PE Was on apixaban , now held 2/2 bleed   MDD hold brexiprazole   Hyponatremia- Due to poor oral intake. Hold brexiprazole, aldactone . Encourage oral fluids, continue salt  tabs   Febrile illness. Resolved. No further work up needed.   Retroperitoneal hematoma Acute blood loss anemia Diagnosed 5/30, moderate in size. Has received total of 5 units last on 5/31, hgb now stable Anticoagulation on hold   Cirrhosis Hepatic encephalopathy Resume Lactulose  for 2-3 bowel movements.   Nutrition: Eating better Continue nocturnal feeds for now and gradually wean. Discussed with dietician.   Hypoglycemia Sugars on and off low. Encourage oral diet, tube feeds. Hypoglycemia protocol.      Palliative on board for goals of care discussion. Out of bed to chair. Incentive spirometry. Nursing supportive care. Fall, aspiration precautions. Diet:  Diet Orders (From admission, onward)     Start     Ordered   12/23/23 1657  DIET SOFT Room service appropriate? Yes; Fluid consistency: Thin  Diet effective now       Question Answer Comment  Room service appropriate? Yes   Fluid consistency: Thin      12/23/23 1656           DVT prophylaxis:   Level of care: Progressive   Code Status: Do not attempt resuscitation (DNR) PRE-ARREST INTERVENTIONS DESIRED  Subjective: Patient is seen and examined today morning. He denies any complaints, but feels awful. Able to answer appropriately.  Physical Exam: Vitals:   12/24/23 1925 12/25/23 0518 12/25/23 0900 12/25/23 1535  BP: 115/78 117/85 122/84 104/78  Pulse: (!) 104 87 99 96  Resp: 13 18 18 15   Temp: 98.2 F (36.8 C) 97.7 F (36.5 C) 98.5 F (36.9 C) 98.6 F (37 C)  TempSrc: Oral Oral Oral Oral  SpO2: 100% 100% 100% 100%  Weight:  62.8 kg    Height:        General - Elderly weak Caucasian male, no  apparent distress HEENT - PERRLA, EOMI, atraumatic head, non tender sinuses. Lung - Clear, basal rales, rhonchi, no wheezes. Heart - S1, S2 heard, no murmurs, rubs, trace pedal edema. Abdomen - Soft, non tender, bowel sounds good Neuro - sleeping, arousable, non focal exam. Skin - Warm and  dry.  Data Reviewed:      Latest Ref Rng & Units 12/25/2023    1:11 AM 12/24/2023    2:58 AM 12/23/2023   10:38 AM  CBC  WBC 4.0 - 10.5 K/uL 4.5  4.9  6.5   Hemoglobin 13.0 - 17.0 g/dL 9.7  8.7  8.5   Hematocrit 39.0 - 52.0 % 29.7  27.1  26.6   Platelets 150 - 400 K/uL 467  395  358       Latest Ref Rng & Units 12/25/2023    1:11 AM 12/24/2023    2:58 AM 12/23/2023   10:38 AM  BMP  Glucose 70 - 99 mg/dL 409  811  83   BUN 8 - 23 mg/dL  20  23   Creatinine 9.14 - 1.24 mg/dL  7.82  9.56   Sodium 213 - 145 mmol/L  126  127   Potassium 3.5 - 5.1 mmol/L  4.4  4.9   Chloride 98 - 111 mmol/L  98  101   CO2 22 - 32 mmol/L  19  17   Calcium  8.9 - 10.3 mg/dL  8.5  8.4    No results found.  Disposition: Status is: Inpatient Remains inpatient appropriate because: severity of illness  Planned Discharge Destination: Skilled nursing facility     Time spent: 37 minutes  Author: Aisha Hove, MD 12/25/2023 6:22 PM Secure chat 7am to 7pm For on call review [email or web address].

## 2023-12-26 DIAGNOSIS — E440 Moderate protein-calorie malnutrition: Secondary | ICD-10-CM | POA: Diagnosis not present

## 2023-12-26 DIAGNOSIS — Z86711 Personal history of pulmonary embolism: Secondary | ICD-10-CM | POA: Diagnosis not present

## 2023-12-26 DIAGNOSIS — E872 Acidosis, unspecified: Secondary | ICD-10-CM | POA: Diagnosis not present

## 2023-12-26 DIAGNOSIS — F10931 Alcohol use, unspecified with withdrawal delirium: Secondary | ICD-10-CM | POA: Diagnosis not present

## 2023-12-26 LAB — GLUCOSE, CAPILLARY
Glucose-Capillary: 99 mg/dL (ref 70–99)
Glucose-Capillary: 98 mg/dL (ref 70–99)
Glucose-Capillary: 108 mg/dL — ABNORMAL HIGH (ref 70–99)
Glucose-Capillary: 93 mg/dL (ref 70–99)
Glucose-Capillary: 103 mg/dL — ABNORMAL HIGH (ref 70–99)

## 2023-12-26 MED ORDER — PROSOURCE PLUS PO LIQD
30.0000 mL | Freq: Two times a day (BID) | ORAL | Status: DC
  Administered 2023-12-26 – 2024-01-05 (×19): 30 mL via ORAL
  Filled 2023-12-26 (×16): qty 30

## 2023-12-26 MED ORDER — LORATADINE 10 MG PO TABS
10.0000 mg | ORAL_TABLET | Freq: Every day | ORAL | Status: DC
  Administered 2023-12-26 – 2024-01-10 (×16): 10 mg via ORAL
  Filled 2023-12-26 (×16): qty 1

## 2023-12-26 NOTE — Progress Notes (Signed)
 Progress Note   Patient: Anthony A Caswell Jr. ZOX:[id number] DOB: [birth year] DOA: 12/07/2023     18 DOS: the patient was seen and examined on 12/26/2023   Brief hospital course: 68 year old man who presented to [facility] ED 5/20 for SOB. PMHx significant for HTN, aortic atherosclerosis, PAF, PE (previously on Eliquis ), EtOH abuse c/b withdrawal/DTs, tobacco abuse, hiatal hernia (s/p Nissen 2016), chronic low back pain, anxiety who is homeless, recently discharged after opiate withdrawal treatment presented to the ED for evaluation of delirium, hyperglycemia admitted to TRH service for EtOH withdrawal.  While waiting for bed placement patient became intermittently agitated requiring Ativan  per CIWA protocol, transferred to [facility] for intermittent agitation requiring Precedex .  Patient was placed on Precedex , Librium , clonazepam .  Patient is seen by GI, started on stress dose steroids.  Liver, hypoperfusion.  Patient is also seen by advanced heart failure team for biventricular heart failure.  He is transferred to TRH service for further management of hyperglycemia, hyponatremia.  Assessment and Plan: Alcohol  withdrawal, complicated Resolved, Ativan  as needed.   Continue folate, multivitamin, thiamine    Hospital delirium MRI brain nothing acute but advanced atrophy, Neuro thinks hospital delirium superimposed on cognitive impairment. Treated here for possible wernicke. May also have hepatic encephalopathy as below Mental status stable.  Physical therapy evaluated him, able to ambulate.   A-fib Rvr this hospitalization, resolved Continue oral amiodarone  Anticoagulation on hold 2/2 retroperitoneal bleed   HFrEF EF 35-40 on tte. Appears euvolemic today Continue spiro   Homelessness- TOC working on safe discharge plan   History PE Was on apixaban , now held 2/2 bleed   MDD hold brexiprazole   Hyponatremia- Due to poor oral intake. Hold brexiprazole, aldactone . Encourage oral fluids, continue salt  tabs. Trend Na.   Febrile illness. Resolved. No further work up needed.   Retroperitoneal hematoma Acute blood loss anemia Diagnosed 5/30, moderate in size. Has received total of 5 units last on 5/31, hgb now stable Anticoagulation on hold   Cirrhosis Hepatic encephalopathy Resume Lactulose  for 2-3 bowel movements.   Nutrition: Eating better Continue nocturnal feeds for now and gradually wean. Discussed with dietician.   Hypoglycemia Sugars better. Encourage oral diet, supplements. Hypoglycemia protocol.      Palliative on board for goals of care discussion. Out of bed to chair. Incentive spirometry. Nursing supportive care. Fall, aspiration precautions. Diet:  Diet Orders (From admission, onward)     Start     Ordered   12/23/23 1657  DIET SOFT Room service appropriate? Yes; Fluid consistency: Thin  Diet effective now       Question Answer Comment  Room service appropriate? Yes   Fluid consistency: Thin      12/23/23 1656           DVT prophylaxis:   Level of care: Progressive   Code Status: Do not attempt resuscitation (DNR) PRE-ARREST INTERVENTIONS DESIRED  Subjective: Patient is seen and examined today morning. States he has allergies, has runny nose, itching of eyes. Asks for allergy med. Eating better today.  Physical Exam: Vitals:   12/25/23 2339 12/26/23 0403 12/26/23 0755 12/26/23 1150  BP: 122/77 121/81 112/72 111/69  Pulse: (!) 106 (!) 102 97 87  Resp: 16 17 20 19   Temp: 98.6 F (37 C) 99.1 F (37.3 C) 99.6 F (37.6 C) 99.2 F (37.3 C)  TempSrc: Oral Oral Oral Oral  SpO2: 100% 100% 97% 99%  Weight:  62.6 kg    Height:        General -  Elderly weak Caucasian male, no apparent distress HEENT - PERRLA, EOMI, atraumatic head, non tender sinuses. Lung - Clear, basal rales, rhonchi, no wheezes. Heart - S1, S2 heard, no murmurs, rubs, trace pedal edema. Abdomen - Soft, non tender, bowel sounds good Neuro - sleeping, arousable, non  focal exam. Skin - Warm and dry.  Data Reviewed:      Latest Ref Rng & Units 12/25/2023    1:11 AM 12/24/2023    2:58 AM 12/23/2023   10:38 AM  CBC  WBC 4.0 - 10.5 K/uL 4.5  4.9  6.5   Hemoglobin 13.0 - 17.0 g/dL 9.7  8.7  8.5   Hematocrit 39.0 - 52.0 % 29.7  27.1  26.6   Platelets 150 - 400 K/uL 467  395  358       Latest Ref Rng & Units 12/25/2023    1:11 AM 12/24/2023    2:58 AM 12/23/2023   10:38 AM  BMP  Glucose 70 - 99 mg/dL 161  096  83   BUN 8 - 23 mg/dL  20  23   Creatinine 0.45 - 1.24 mg/dL  4.09  8.11   Sodium 914 - 145 mmol/L  126  127   Potassium 3.5 - 5.1 mmol/L  4.4  4.9   Chloride 98 - 111 mmol/L  98  101   CO2 22 - 32 mmol/L  19  17   Calcium  8.9 - 10.3 mg/dL  8.5  8.4    No results found.  Disposition: Status is: Inpatient Remains inpatient appropriate because: safe placement  Planned Discharge Destination: Skilled nursing facility     Time spent: 36 minutes  Author: Aisha Hove, MD 12/26/2023 12:29 PM Secure chat 7am to 7pm For on call review [email or web address].

## 2023-12-26 NOTE — Plan of Care (Signed)
   Problem: Activity: Goal: Risk for activity intolerance will decrease Outcome: Progressing   Problem: Nutrition: Goal: Adequate nutrition will be maintained Outcome: Progressing

## 2023-12-27 DIAGNOSIS — E440 Moderate protein-calorie malnutrition: Secondary | ICD-10-CM | POA: Diagnosis not present

## 2023-12-27 DIAGNOSIS — Z515 Encounter for palliative care: Secondary | ICD-10-CM | POA: Diagnosis not present

## 2023-12-27 DIAGNOSIS — F10930 Alcohol use, unspecified with withdrawal, uncomplicated: Secondary | ICD-10-CM | POA: Diagnosis not present

## 2023-12-27 DIAGNOSIS — Z86711 Personal history of pulmonary embolism: Secondary | ICD-10-CM | POA: Diagnosis not present

## 2023-12-27 DIAGNOSIS — Z66 Do not resuscitate: Secondary | ICD-10-CM | POA: Diagnosis not present

## 2023-12-27 DIAGNOSIS — F10931 Alcohol use, unspecified with withdrawal delirium: Secondary | ICD-10-CM | POA: Diagnosis not present

## 2023-12-27 DIAGNOSIS — E872 Acidosis, unspecified: Secondary | ICD-10-CM | POA: Diagnosis not present

## 2023-12-27 LAB — BASIC METABOLIC PANEL WITH GFR
Anion gap: 8 (ref 5–15)
BUN: 23 mg/dL (ref 8–23)
CO2: 20 mmol/L — ABNORMAL LOW (ref 22–32)
Calcium: 9 mg/dL (ref 8.9–10.3)
Chloride: 102 mmol/L (ref 98–111)
Creatinine, Ser: 0.54 mg/dL — ABNORMAL LOW (ref 0.61–1.24)
GFR, Estimated: >60 mL/min (ref >60)
Glucose, Bld: 123 mg/dL — ABNORMAL HIGH (ref 70–99)
Potassium: 4.5 mmol/L (ref 3.5–5.1)
Sodium: 130 mmol/L — ABNORMAL LOW (ref 135–145)

## 2023-12-27 LAB — GLUCOSE, CAPILLARY
Glucose-Capillary: 95 mg/dL (ref 70–99)
Glucose-Capillary: 84 mg/dL (ref 70–99)
Glucose-Capillary: 100 mg/dL — ABNORMAL HIGH (ref 70–99)
Glucose-Capillary: 102 mg/dL — ABNORMAL HIGH (ref 70–99)
Glucose-Capillary: 87 mg/dL (ref 70–99)
Glucose-Capillary: 123 mg/dL — ABNORMAL HIGH (ref 70–99)

## 2023-12-27 MED ORDER — SODIUM CHLORIDE 1 G PO TABS
1.0000 g | ORAL_TABLET | Freq: Three times a day (TID) | ORAL | Status: DC
  Administered 2023-12-27 – 2024-01-27 (×88): 1 g via ORAL
  Filled 2023-12-27 (×91): qty 1

## 2023-12-27 NOTE — Progress Notes (Addendum)
 Occupational Therapy Treatment Patient Details Name: Anthony A Collier Jr. MRN: [id number] DOB: 11-Jan-1956 Today's Date: 12/27/2023   History of present illness Pt is a 68 y.o. male who presented 12/06/23 with SOB. Shortly after arriving, pt became agitated and was given IV Ativan . CT head is unremarkable. Patient admitted for EtOH withdrawal, acute delirium, and hypoglycemia. Cortrak placed 5/23.  The day before the patient had presented to ED requesting EtOH detox; patient was deemed to be not actively in EtOH withdrawal and was discharged, however upon discharge patient threatened staff and was escorted off of the premises by [employer]. PMH includes alcohol  abuse, HTN, CVA, HFmrEF, afib, PE, HLD, anxiety, depression, chronic low back pain.   OT comments  Patient continues to complain of generalized fatigue, and needs increased encouragement to participate with any meaningful OT.  Patient has improved with his overall mobility needing CGA to Min A at RW level, which has improved his ADL performance, but the patient continues to self limit.  OT can try for a few more treatments, but patient may be discontinued to RN staff for ADL.  Patient will benefit from continued inpatient follow up therapy, <3 hours/day with potential transition to LTC.        If plan is discharge home, recommend the following:  Assist for transportation;A lot of help with bathing/dressing/bathroom;A lot of help with walking and/or transfers   Equipment Recommendations  None recommended by OT    Recommendations for Other Services      Precautions / Restrictions Precautions Precautions: Fall Recall of Precautions/Restrictions: Impaired Precaution/Restrictions Comments: cortrak; watch SpO2 Restrictions Weight Bearing Restrictions Per Provider Order: No       Mobility Bed Mobility Overal bed mobility: Needs Assistance Bed Mobility: Supine to Sit, Sit to Supine     Supine to sit: Contact guard Sit to supine: Contact guard  assist        Transfers Overall transfer level: Needs assistance Equipment used: Rolling walker (2 wheels) Transfers: Sit to/from Stand Sit to Stand: Contact guard assist                 Balance Overall balance assessment: Needs assistance Sitting-balance support: No upper extremity supported, Feet supported Sitting balance-Leahy Scale: Fair     Standing balance support: Reliant on assistive device for balance Standing balance-Leahy Scale: Poor                             ADL either performed or assessed with clinical judgement   ADL   Eating/Feeding: Set up;Sitting;Bed level   Grooming: Wash/dry hands;Wash/dry face;Contact guard assist;Standing   Upper Body Bathing: Set up;Sitting   Lower Body Bathing: Contact guard assist;Minimal assistance;Sit to/from stand   Upper Body Dressing : Set up;Sitting   Lower Body Dressing: Contact guard assist;Minimal assistance;Sit to/from stand   Toilet Transfer: Regular Toilet;Minimal assistance;Ambulation;Rolling walker (2 wheels)                  Extremity/Trunk Assessment Upper Extremity Assessment Upper Extremity Assessment: Overall WFL for tasks assessed   Lower Extremity Assessment Lower Extremity Assessment: Defer to PT evaluation   Cervical / Trunk Assessment Cervical / Trunk Assessment: Kyphotic    Vision Baseline Vision/History: 1 Wears glasses Patient Visual Report: No change from baseline     Perception Perception Perception: Not tested   Praxis Praxis Praxis: Not tested   Communication Communication Communication: No apparent difficulties   Cognition Arousal: Alert Behavior During Therapy: [facility]  for tasks assessed/performed Cognition: Cognition impaired       Memory impairment (select all impairments): Short-term memory, Working memory Attention impairment (select first level of impairment): Sustained attention Executive functioning impairment (select all impairments):  Reasoning, Problem solving                   Following commands: Impaired Following commands impaired: Follows one step commands with increased time      Cueing   Cueing Techniques: Verbal cues, Tactile cues, Visual cues  Exercises      Shoulder Instructions       General Comments      Pertinent Vitals/ Pain       Pain Assessment Pain Assessment: Faces Faces Pain Scale: No hurt Pain Intervention(s): Monitored during session                                                          Frequency  Min 2X/week        Progress Toward Goals  OT Goals(current goals can now be found in the care plan section)  Progress towards OT goals: Progressing toward goals  Acute Rehab OT Goals OT Goal Formulation: Patient unable to participate in goal setting Time For Goal Achievement: 01/03/24 Potential to Achieve Goals: Fair  Plan      Co-evaluation                 AM-PAC OT "6 Clicks" Daily Activity     Outcome Measure   Help from another person eating meals?: None Help from another person taking care of personal grooming?: A Vandergriff Help from another person toileting, which includes using toliet, bedpan, or urinal?: A Segovia Help from another person bathing (including washing, rinsing, drying)?: A [place] Help from another person to put on and taking off regular upper body clothing?: A Kropf Help from another person to put on and taking off regular lower body clothing?: A Peets 6 Click Score: 19    End of Session Equipment Utilized During Treatment: Rolling walker (2 wheels)  OT Visit Diagnosis: Unsteadiness on feet (R26.81);Muscle weakness (generalized) (M62.81)   Activity Tolerance Patient tolerated treatment well   Patient Left in bed;with call bell/phone within reach;with bed alarm set   Nurse Communication Other (comment)        Time: 1610-9604 OT Time Calculation (min): 17 min  Charges: OT General Charges $OT Visit:  1 Visit OT Treatments $Self Care/Home Management : 8-22 mins  12/27/2023  RP, OTR/L  Acute Rehabilitation Services  Office:  [phone]   Anthony [name] 12/27/2023, 1:58 PM

## 2023-12-27 NOTE — TOC Progression Note (Signed)
 Transition of Care (TOC) - Progression Note    Patient Details  Name: Anthony A Needs Jr. MRN: [id number] Date of Birth: 12/09/1955  Transition of Care [facility]) CM/SW Contact  Arron Big, [place] Phone Number: 12/27/2023, 9:57 AM  Clinical Narrative:   Per chart review, patient still has cortrak. Awaiting removal of cortrak before SNF workup as SNFs will not accept cortraks.   TOC will continue to follow.    Expected Discharge Plan: Skilled Nursing Facility Barriers to Discharge: Continued Medical Work up  Expected Discharge Plan and Services In-house Referral: Clinical Social Work     Living arrangements for the past 2 months: Homeless                                       Social Determinants of Health (SDOH) Interventions SDOH Screenings   Food Insecurity: Patient Unable To Answer (12/09/2023)  Housing: Patient Unable To Answer (12/09/2023)  Transportation Needs: Patient Unable To Answer (12/09/2023)  Utilities: Patient Unable To Answer (12/09/2023)  Depression (PHQ2-9): Medium Risk (10/16/2020)  Social Connections: Unknown (12/22/2023)  Tobacco Use: High Risk (12/08/2023)    Readmission Risk Interventions    08/27/2023   12:21 PM 08/19/2023    9:17 AM 07/12/2023    9:58 AM  Readmission Risk Prevention Plan  Transportation Screening Complete Complete Complete  Medication Review Oceanographer) Complete Complete Complete  PCP or Specialist appointment within 3-5 days of discharge Complete  --  HRI or Home Care Consult Complete Complete Complete  SW Recovery Care/Counseling Consult Complete Complete Complete  Palliative Care Screening Not Applicable Not Applicable Not Applicable  Skilled Nursing Facility Not Applicable Not Applicable Not Applicable

## 2023-12-27 NOTE — Progress Notes (Signed)
 Progress Note   Patient: Anthony A Steadham Jr. JXB:[id number] DOB: 04-23-1956 DOA: 12/07/2023     19 DOS: the patient was seen and examined on 12/27/2023   Brief hospital course: 69 year old man who presented to [facility] ED 5/20 for SOB. PMHx significant for HTN, aortic atherosclerosis, PAF, PE (previously on Eliquis ), EtOH abuse c/b withdrawal/DTs, tobacco abuse, hiatal hernia (s/p Nissen 2016), chronic low back pain, anxiety who is homeless, recently discharged after opiate withdrawal treatment presented to the ED for evaluation of delirium, hyperglycemia admitted to TRH service for EtOH withdrawal.  While waiting for bed placement patient became intermittently agitated requiring Ativan  per CIWA protocol, transferred to [facility] for intermittent agitation requiring Precedex .  Patient was placed on Precedex , Librium , clonazepam .  Patient is seen by GI, started on stress dose steroids.  Liver, hypoperfusion.  Patient is also seen by advanced heart failure team for biventricular heart failure.  He is transferred to TRH service for further management of hyperglycemia, hyponatremia.  Assessment and Plan: Alcohol  withdrawal, complicated Resolved, Ativan  as needed.   Continue folate, multivitamin, thiamine    Hospital delirium MRI brain nothing acute but advanced atrophy, Neuro thinks hospital delirium superimposed on cognitive impairment. Treated here for possible wernicke. May also have hepatic encephalopathy as below Mental status stable.  Physical therapy evaluated him, able to ambulate.   A-fib Rvr this hospitalization, resolved Continue oral amiodarone  Anticoagulation on hold 2/2 retroperitoneal bleed   HFrEF EF 35-40 on tte. Appears euvolemic today Continue spiro   Homelessness- TOC working on safe discharge plan   History PE Was on apixaban , now held 2/2 bleed   MDD hold brexiprazole   Hyponatremia- Due to poor oral intake. Hold brexiprazole, aldactone . Encourage oral fluids, continue salt  tabs. Trend Na.   Febrile illness. Resolved. No further work up needed.   Retroperitoneal hematoma Acute blood loss anemia Diagnosed 5/30, moderate in size. Has received total of 5 units last on 5/31, hgb now stable Anticoagulation on hold   Cirrhosis Hepatic encephalopathy Resume Lactulose  for 2-3 bowel movements.   Nutrition: Eating better Continue nocturnal feeds for now and gradually wean. Plan to remove cortrak for placement.   Hypoglycemia Sugars better. Encourage oral diet, supplements. Hypoglycemia protocol.      Palliative on board for goals of care discussion. Out of bed to chair. Incentive spirometry. Nursing supportive care. Fall, aspiration precautions. Diet:  Diet Orders (From admission, onward)     Start     Ordered   12/23/23 1657  DIET SOFT Room service appropriate? Yes; Fluid consistency: Thin  Diet effective now       Question Answer Comment  Room service appropriate? Yes   Fluid consistency: Thin      12/23/23 1656           DVT prophylaxis:   Level of care: Progressive   Code Status: Limited: Do not attempt resuscitation (DNR) -DNR-LIMITED -Do Not Intubate/DNI   Subjective: Patient is seen and examined today morning. Sleeping, arousable. States he is eating well. No overnight issues.  Physical Exam: Vitals:   12/27/23 0411 12/27/23 0740 12/27/23 1123 12/27/23 1125  BP:  120/86 99/66 100/70  Pulse:  99 94 95  Resp:  19 20 20   Temp:  98.9 F (37.2 C) 99.1 F (37.3 C)   TempSrc:  Oral Oral   SpO2:  99% 96% 96%  Weight: 65 kg     Height:        General - Elderly weak Caucasian male, no apparent distress HEENT -  PERRLA, EOMI, atraumatic head, cortrak in place Lung - Clear, basal rales, rhonchi, no wheezes. Heart - S1, S2 heard, no murmurs, rubs, trace pedal edema. Abdomen - Soft, non tender, bowel sounds good Neuro - sleeping, arousable, non focal exam. Skin - Warm and dry.  Data Reviewed:      Latest Ref Rng & Units  12/25/2023    1:11 AM 12/24/2023    2:58 AM 12/23/2023   10:38 AM  CBC  WBC 4.0 - 10.5 K/uL 4.5  4.9  6.5   Hemoglobin 13.0 - 17.0 g/dL 9.7  8.7  8.5   Hematocrit 39.0 - 52.0 % 29.7  27.1  26.6   Platelets 150 - 400 K/uL 467  395  358       Latest Ref Rng & Units 12/27/2023    2:56 AM 12/25/2023    1:11 AM 12/24/2023    2:58 AM  BMP  Glucose 70 - 99 mg/dL 347  425  956   BUN 8 - 23 mg/dL 23   20   Creatinine 3.87 - 1.24 mg/dL 5.64   3.32   Sodium 951 - 145 mmol/L 130   126   Potassium 3.5 - 5.1 mmol/L 4.5   4.4   Chloride 98 - 111 mmol/L 102   98   CO2 22 - 32 mmol/L 20   19   Calcium  8.9 - 10.3 mg/dL 9.0   8.5    No results found.  Disposition: Status is: Inpatient Remains inpatient appropriate because: removal of cortrak, safe placement  Planned Discharge Destination: Skilled nursing facility     Time spent: 36 minutes  Author: Aisha Hove, MD 12/27/2023 4:07 PM Secure chat 7am to 7pm For on call review [email or web address].

## 2023-12-27 NOTE — Progress Notes (Signed)
 Patient disconnected himself from his tube feeds and also placed a pair of fingernail clippers around his cortrak. Patient stated that he was trying to cut his cortrak off. Clippers removed from cortrak and put away out of patient's reach. Patient educated on importance of cortrak and the tube feeds and instructed not to tamper with any of the medical equipment.

## 2023-12-27 NOTE — Progress Notes (Signed)
 Patient ID: Anthony [name]., male   DOB: 01/11/1956, 68 y.o.   MRN: [id number]    Progress Note from the Palliative Medicine Team at [facility]   Patient Name: Anthony A Goren Jr.        Date: 12/27/2023 DOB: 01-09-1956  Age: 68 y.o. MRN#: [id number] Attending Physician: Aisha Hove, MD Primary Care Physician: Pcp, No Admit Date: 12/07/2023   Reason for Consultation/Follow-up   Establishing Goals of Care   HPI/ Brief Hospital Review  68 year old man who presented to [facility] ED 5/20 for SOB. PMHx significant for HTN, aortic atherosclerosis, PAF, PE (previously on Eliquis ), EtOH abuse c/b withdrawal/DTs, tobacco abuse, hiatal hernia (s/p Nissen 2016), chronic low back pain, anxiety who is homeless, recently discharged after opiate withdrawal treatment presented to the ED for evaluation of delirium, hyperglycemia admitted to TRH service for EtOH withdrawal.    Subjective  Extensive chart review has been completed prior to meeting with patient/family  including labs, vital signs, imaging, progress/consult notes, orders, medications and available advance directive documents.    This NP assessed patient at the bedside as a follow up for palliative medicine needs and emotional support.  Patient is friendly and engaged with me.  We talked about his favorite movies /westerns.  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  He was able to speak to his social situation of intermittent homelessness.  His independence is very important to him and he tells me that even if we found him a [place] house down the street he is not sure if he could except that as a living situation.  I question patient's capacity and competency, discussed with TOC.  I believe   I spoke to his brother [name] by telephone, [name] lives in [place] .  He understands the complexity of patient's psychosocial situation.  Patient has been intermittently homeless over the past many years.  He has lived in boardinghouse  situations, and rental rooms.  Many time he has been asked to leave for outbursts.  He speaks to how intelligent he is, but is life has been severely impacted by his alcoholism.    Patient's brother tells me that the worst possible thing for patient would be need for long-term care placement.  Explored with patient's brother the option of securing guardianship.  He would be willing to do that if he thought that his brother did not have capacity to make his own decisions.  - Continue to treat the treatable and hope for short-term SNF for rehab until a more excepted home long-term option is put in place.  Education offered today regarding  the importance of continued conversation with family and their  medical providers regarding overall plan of care and treatment options,  ensuring decisions are within the context of the patients values and GOCs.  Questions and concerns addressed   Discussed with primary team and nursing staff   Time: 50  minutes  Detailed review of medical records ( labs, imaging, vital signs), medically appropriate exam ( MS, skin, cardiac,  resp)   discussed with treatment team, counseling and education to patient, family, staff, documenting clinical information, medication management, coordination of care    [name] NP  Palliative Medicine Team Team Phone # [phone] Pager [phone]

## 2023-12-28 DIAGNOSIS — E440 Moderate protein-calorie malnutrition: Secondary | ICD-10-CM | POA: Diagnosis not present

## 2023-12-28 DIAGNOSIS — F10931 Alcohol use, unspecified with withdrawal delirium: Secondary | ICD-10-CM | POA: Diagnosis not present

## 2023-12-28 DIAGNOSIS — Z86711 Personal history of pulmonary embolism: Secondary | ICD-10-CM | POA: Diagnosis not present

## 2023-12-28 DIAGNOSIS — E872 Acidosis, unspecified: Secondary | ICD-10-CM | POA: Diagnosis not present

## 2023-12-28 LAB — GLUCOSE, CAPILLARY
Glucose-Capillary: 109 mg/dL — ABNORMAL HIGH (ref 70–99)
Glucose-Capillary: 111 mg/dL — ABNORMAL HIGH (ref 70–99)
Glucose-Capillary: 192 mg/dL — ABNORMAL HIGH (ref 70–99)
Glucose-Capillary: 78 mg/dL (ref 70–99)
Glucose-Capillary: 89 mg/dL (ref 70–99)
Glucose-Capillary: 97 mg/dL (ref 70–99)

## 2023-12-28 LAB — BASIC METABOLIC PANEL WITH GFR
Anion gap: 8 (ref 5–15)
BUN: 20 mg/dL (ref 8–23)
CO2: 22 mmol/L (ref 22–32)
Calcium: 8.9 mg/dL (ref 8.9–10.3)
Chloride: 97 mmol/L — ABNORMAL LOW (ref 98–111)
Creatinine, Ser: 0.47 mg/dL — ABNORMAL LOW (ref 0.61–1.24)
GFR, Estimated: >60 mL/min (ref >60)
Glucose, Bld: 97 mg/dL (ref 70–99)
Potassium: 4.2 mmol/L (ref 3.5–5.1)
Sodium: 127 mmol/L — ABNORMAL LOW (ref 135–145)

## 2023-12-28 NOTE — Progress Notes (Signed)
 Progress Note   Patient: Anthony A Asman Jr. RUE:[id number] DOB: [birth year] DOA: 12/07/2023     20 DOS: the patient was seen and examined on 12/28/2023   Brief hospital course: 68 year old man who presented to [facility] ED 5/20 for SOB. PMHx significant for HTN, aortic atherosclerosis, PAF, PE (previously on Eliquis ), EtOH abuse c/b withdrawal/DTs, tobacco abuse, hiatal hernia (s/p Nissen 2016), chronic low back pain, anxiety who is homeless, recently discharged after opiate withdrawal treatment presented to the ED for evaluation of delirium, hyperglycemia admitted to TRH service for EtOH withdrawal.  While waiting for bed placement patient became intermittently agitated requiring Ativan  per CIWA protocol, transferred to [facility] for intermittent agitation requiring Precedex .  Patient was placed on Precedex , Librium , clonazepam .  Patient is seen by GI, started on stress dose steroids.  Liver, hypoperfusion.  Patient is also seen by advanced heart failure team for biventricular heart failure.  He is transferred to TRH service for further management of hyperglycemia, hyponatremia.  Assessment and Plan: Alcohol  withdrawal, complicated Resolved, Ativan  as needed.   Continue folate, multivitamin, thiamine    Hospital delirium MRI brain nothing acute but advanced atrophy, Neuro thinks hospital delirium superimposed on cognitive impairment. Treated here for possible wernicke. May also have hepatic encephalopathy as below Mental status stable.  Physical therapy evaluated him, able to ambulate.   A-fib Rvr this hospitalization, resolved Continue oral amiodarone  Anticoagulation on hold 2/2 retroperitoneal bleed   HFrEF EF 35-40 on tte. Appears euvolemic today Continue spiro   Homelessness- TOC working on safe discharge plan   History PE Was on apixaban , now held 2/2 bleed   MDD hold brexiprazole   Hyponatremia- Na stable 127-131 Hold brexiprazole, aldactone . Encourage oral fluids, continue salt  tabs.   Febrile illness. Resolved. No further work up needed.   Retroperitoneal hematoma Acute blood loss anemia Diagnosed 5/30, moderate in size. Has received total of 5 units last on 5/31, hgb now stable Anticoagulation on hold   Cirrhosis Hepatic encephalopathy Resume Lactulose  for 2-3 bowel movements.   Nutrition: Eating still poor. Calorie count per RD daily. Continue nocturnal feeds for now. Plan to remove cortrak for placement.   Hypoglycemia Sugars better. Encourage oral diet, supplements. Hypoglycemia protocol.      Palliative on board for goals of care discussion. Out of bed to chair. Incentive spirometry. Nursing supportive care. Fall, aspiration precautions. Diet:  Diet Orders (From admission, onward)     Start     Ordered   12/23/23 1657  DIET SOFT Room service appropriate? Yes; Fluid consistency: Thin  Diet effective now       Question Answer Comment  Room service appropriate? Yes   Fluid consistency: Thin      12/23/23 1656           DVT prophylaxis:   Level of care: Progressive   Code Status: Limited: Do not attempt resuscitation (DNR) -DNR-LIMITED -Do Not Intubate/DNI   Subjective: Patient is seen and examined today morning. Feels weak. Has nausea, eating poor. No overnight issues.  Physical Exam: Vitals:   12/27/23 2020 12/28/23 0005 12/28/23 0400 12/28/23 0455  BP: 113/74 108/81 106/70 114/75  Pulse: (!) 103 99 93   Resp: (!) 21 20 (!) 22 16  Temp: (!) 97.4 F (36.3 C) 98.3 F (36.8 C) 99.3 F (37.4 C) 98.7 F (37.1 C)  TempSrc: Oral Oral Oral   SpO2: 98% 97% 98% 99%  Weight:   64.6 kg   Height:        General - Elderly  weak Caucasian male, no apparent distress HEENT - PERRLA, EOMI, atraumatic head, cortrak in place Lung - Clear, basal rales, rhonchi, no wheezes. Heart - S1, S2 heard, no murmurs, rubs, trace pedal edema. Abdomen - Soft, non tender, bowel sounds good Neuro - sleeping, arousable, non focal exam. Skin -  Warm and dry.  Data Reviewed:      Latest Ref Rng & Units 12/25/2023    1:11 AM 12/24/2023    2:58 AM 12/23/2023   10:38 AM  CBC  WBC 4.0 - 10.5 K/uL 4.5  4.9  6.5   Hemoglobin 13.0 - 17.0 g/dL 9.7  8.7  8.5   Hematocrit 39.0 - 52.0 % 29.7  27.1  26.6   Platelets 150 - 400 K/uL 467  395  358       Latest Ref Rng & Units 12/28/2023    2:29 AM 12/27/2023    2:56 AM 12/25/2023    1:11 AM  BMP  Glucose 70 - 99 mg/dL 97  829  562   BUN 8 - 23 mg/dL 20  23    Creatinine 1.30 - 1.24 mg/dL 8.65  7.84    Sodium 696 - 145 mmol/L 127  130    Potassium 3.5 - 5.1 mmol/L 4.2  4.5    Chloride 98 - 111 mmol/L 97  102    CO2 22 - 32 mmol/L 22  20    Calcium  8.9 - 10.3 mg/dL 8.9  9.0     No results found.  Disposition: Status is: Inpatient Remains inpatient appropriate because: removal of cortrak, safe placement  Planned Discharge Destination: Skilled nursing facility     Time spent: 37 minutes  Author: Aisha Hove, MD 12/28/2023 3:19 PM Secure chat 7am to 7pm For on call review [email or web address].

## 2023-12-28 NOTE — Progress Notes (Addendum)
 Nutrition Follow-up,   DOCUMENTATION CODES:  Non-severe (moderate) malnutrition in context of chronic illness  INTERVENTION:  Magic cup TID with meals, each supplement provides 290 kcal and 9 grams of protein Ensure Plus High Protein po BID, each supplement provides 350 kcal and 20 grams of protein Continue calorie count Recommend continuing enteral nutrition via Cortrak as pt likely not meeting estimated nutritional needs. Nocturnal feeds of Osmolite 1.5 at 75 mL/hr  60 mL ProSource TF20 - Daily Regimen provides 1430 calories, 76 gm protein, and 686 mL free water  daily.   NUTRITION DIAGNOSIS:  Moderate Malnutrition related to social / environmental circumstances (ETOH use, homeless) as evidenced by moderate fat depletion, moderate muscle depletion. - Ongoing  GOAL:  Patient will meet greater than or equal to 90% of their needs - Ongoing  MONITOR:  Diet advancement, Labs, Weight trends, TF tolerance  REASON FOR ASSESSMENT:  Consult Assessment of nutrition requirement/status  ASSESSMENT:  68 yo male admitted with EtOH withdrawal with DTs, lactic acidosis, concern for possible sepsis-?biliary source. PMH includes EtOH abuse with recurrent admissions for DTs, tobacco use, hiatal hernia s/p nissen (2016), HTN. Pt is homeless  5/20: Admitted 5/21: CT and US  - findings of gallstones/sludge and GBW thickening; fatty liver, non cirrhotic; unremarkable pancreas 5/23: HIDA- normal GB filling but no emptying in the small bowel; Cortrak placed 5/30: CTA- retroperitoneal hematoma within the R iliopsoas musculature; mild to moderate mass effect on adjacent pelvic organs; diffuse anasarca and free fluid within the abdomen and pelvis 6/02: s/p BSE- regular diet with thin liquids 6/09: Calorie Count ordered  Pt laying in bed. Tube feeds not connected, per MD note on 6/09 TF stopped. Cortrak still in place. Pt reports that he is trying to eat, but doesn't like being pushed. RD observed pt  breakfast tray at bedside, <25% consumed.  Complaining of a headache. Denies any nausea or vomiting. Pt reports that he like vanilla Ensure and would try the berry Magic Cup.   Calorie count initiated yesterday, please see separate note for Day 1 results.   Meal Intake 6/05: 50% breakfast, 25% lunch, 0% dinner 6/06: 0% lunch 6/07: 5% breakfast, 5% lunch 6/08: 65% lunch 6/09: 25% breakfast   Admission Weight: 65 kg  Current Weight: 64.6 kg (6/10)   Nutrition Related Medications: Folic acid , Magnesium  Oxide, Protonix , Sodium Chloride  tablet TID, Thiamine  Labs: Sodium 127, Potassium 4.2, BUN 20, Creatinine 0.47  CBG: 78-192 mg/dL x 24 hrs   UOP: 4098 mL + 2 unmeasured occurrences x 24 hrs  Diet Order:   Diet Order             DIET SOFT Room service appropriate? Yes; Fluid consistency: Thin  Diet effective now                  EDUCATION NEEDS: No education needs have been identified at this time  Skin:  Skin Assessment: Skin Integrity Issues: Skin Integrity Issues:: Other (Comment) Stage II: L tibial Other: IAD to rectum, full thickness lt lower leg wound  Last BM:  6/10  Height:  Ht Readings from Last 1 Encounters:  12/22/23 5\' 6"  (1.676 m)   Weight:  Wt Readings from Last 1 Encounters:  12/28/23 64.6 kg   Ideal Body Weight:  70 kg  BMI:  Body mass index is 22.99 kg/m.  Estimated Nutritional Needs:  Kcal:  1900-2100 Protein:  95-110g Fluid:  >/=1.9L   Doneta Furbish RD, LDN Clinical Dietitian

## 2023-12-28 NOTE — Progress Notes (Signed)
 Calorie Count Note  48 hour calorie count ordered.  Diet: Soft, thin liquids Supplements: Ensure Plus High Protein, Magic Cup  Estimated Nutritional Needs:  Kcal:  1900-2100 Protein:  95-110g Fluid:  >/=1.9L  6/09 Dinner: 192 calories, 7 gm protein 6/10 Breakfast: 162 calories, 5 gm protein 6/10 Lunch: NT estimates pt ate ~25-50% (unable to include in total amount due to unsure specifically what pt ate) Supplements: 258 calories, 12 gm protein  Total intake: 612 kcal (32% of minimum estimated needs)  24 protein (25% of minimum estimated needs)  Nutrition Diagnosis: Moderate Malnutrition related to social / environmental circumstances (ETOH use, homeless) as evidenced by moderate fat depletion, moderate muscle depletion   Goal: Patient will meet greater than or equal to 90% of their need   Intervention:  Magic cup TID with meals, each supplement provides 290 kcal and 9 grams of protein Ensure Plus High Protein po BID, each supplement provides 350 kcal and 20 grams of protein    Doneta Furbish RD, LDN Clinical Dietitian

## 2023-12-28 NOTE — Progress Notes (Signed)
 PT Cancellation Note  Patient Details Name: Anthony A Elsayed Jr. MRN: [id number] DOB: Feb 04, 1956   Cancelled Treatment:    Reason Eval/Treat Not Completed: Patient declined, no reason specified;Pain limiting ability to participate (Pt polietely refused PT treatment reporting this is the worst he has every felt and he cannot tolerate any activity currently. Pt was unable to rate his pain but cited an awful headache. RN notified of pt's request for pain medication.) Will follow-up as schedule permits.   Glenford Lanes, PT, DPT Acute Rehabilitation Services Office: [phone] Secure Chat Preferred  Riva Chester 12/28/2023, 12:32 PM

## 2023-12-28 NOTE — Plan of Care (Signed)

## 2023-12-29 DIAGNOSIS — Z86711 Personal history of pulmonary embolism: Secondary | ICD-10-CM | POA: Diagnosis not present

## 2023-12-29 DIAGNOSIS — E440 Moderate protein-calorie malnutrition: Secondary | ICD-10-CM | POA: Diagnosis not present

## 2023-12-29 DIAGNOSIS — Z5900 Homelessness unspecified: Secondary | ICD-10-CM

## 2023-12-29 DIAGNOSIS — E872 Acidosis, unspecified: Secondary | ICD-10-CM | POA: Diagnosis not present

## 2023-12-29 DIAGNOSIS — R638 Other symptoms and signs concerning food and fluid intake: Secondary | ICD-10-CM

## 2023-12-29 DIAGNOSIS — F10931 Alcohol use, unspecified with withdrawal delirium: Secondary | ICD-10-CM | POA: Diagnosis not present

## 2023-12-29 LAB — BASIC METABOLIC PANEL WITH GFR
Anion gap: 7 (ref 5–15)
BUN: 17 mg/dL (ref 8–23)
CO2: 22 mmol/L (ref 22–32)
Calcium: 8.9 mg/dL (ref 8.9–10.3)
Chloride: 98 mmol/L (ref 98–111)
Creatinine, Ser: 0.54 mg/dL — ABNORMAL LOW (ref 0.61–1.24)
GFR, Estimated: >60 mL/min (ref >60)
Glucose, Bld: 72 mg/dL (ref 70–99)
Potassium: 4.1 mmol/L (ref 3.5–5.1)
Sodium: 127 mmol/L — ABNORMAL LOW (ref 135–145)

## 2023-12-29 LAB — GLUCOSE, CAPILLARY
Glucose-Capillary: 91 mg/dL (ref 70–99)
Glucose-Capillary: 95 mg/dL (ref 70–99)
Glucose-Capillary: 94 mg/dL (ref 70–99)
Glucose-Capillary: 106 mg/dL — ABNORMAL HIGH (ref 70–99)
Glucose-Capillary: 87 mg/dL (ref 70–99)

## 2023-12-29 LAB — CBC
HCT: 30.9 % — ABNORMAL LOW (ref 39.0–52.0)
Hemoglobin: 9.9 g/dL — ABNORMAL LOW (ref 13.0–17.0)
MCH: 30.3 pg (ref 26.0–34.0)
MCHC: 32 g/dL (ref 30.0–36.0)
MCV: 94.5 fL (ref 80.0–100.0)
Platelets: 626 10*3/uL — ABNORMAL HIGH (ref 150–400)
RBC: 3.27 MIL/uL — ABNORMAL LOW (ref 4.22–5.81)
RDW: 19.1 % — ABNORMAL HIGH (ref 11.5–15.5)
WBC: 5 10*3/uL (ref 4.0–10.5)
nRBC: 0 % (ref 0.0–0.2)

## 2023-12-29 NOTE — Progress Notes (Signed)
 Patient ID: Anthony [name]., male   DOB: 1956/03/18, 68 y.o.   MRN: [id number]    Progress Note from the Palliative Medicine Team at [facility]   Patient Name: Anthony [name].        Date: 12/29/2023 DOB: Feb 23, 1956  Age: 68 y.o. MRN#: [id number] Attending Physician: Aisha Hove, MD Primary Care Physician: Pcp, No Admit Date: 12/07/2023   Reason for Consultation/Follow-up   Establishing Goals of Care   HPI/ Brief Hospital Review  68 year old man who presented to [facility] ED 5/20 for SOB. PMHx significant for HTN, aortic atherosclerosis, PAF, PE (previously on Eliquis ), EtOH abuse c/b withdrawal/DTs, tobacco abuse, hiatal hernia (s/p Nissen 2016), chronic low back pain, anxiety who is homeless, recently discharged after opiate withdrawal treatment presented to the ED for evaluation of delirium, hyperglycemia admitted to TRH service for EtOH withdrawal.    Subjective  Extensive chart review has been completed prior to meeting with patient/family  including labs, vital signs, imaging, progress/consult notes, orders, medications and available advance directive documents.    This NP assessed patient at the bedside as a follow up for palliative medicine needs and emotional support.  Patient is friendly and engaged with me.   Ongoing conversation regarding his current medical situation and desired wishes for this time in his life.  I continue to question patient's capacity and competency.    Conversation becomes very tangential at times.  He cannot speak to rationale/understanding for feeding tube, or his desire for current PEG or ongoing artificial feeding  He does tell me that I never ate very much   He is able to speak to his social situation of intermittent homelessness.  His independence is very important to him.  I need to go somewhere, he reports that he thinks an AL would destroyed me.  He expresses hope for the possibility of a group home  Plan as previously  climbed  continue to treat the treatable and hope for short-term SNF for rehab once PEG tube is removed.   Questions and concerns addressed   PMT will shadow intermittently for palliative medicine needs, this is a complex psychosocial, disposition situation and will need time for outcomes to play out.   Time: 40  minutes  Detailed review of medical records ( labs, imaging, vital signs), medically appropriate exam ( MS, skin, cardiac,  resp)   discussed with treatment team, counseling and education to patient, family, staff, documenting clinical information, medication management, coordination of care    [name] NP  Palliative Medicine Team Team Phone # [phone] Pager [phone]

## 2023-12-29 NOTE — Progress Notes (Addendum)
 Calorie Count Note  48 hour calorie count ordered.  Diet: Soft, thin liquids Supplements: Ensure Plus High Protein, Magic Cup  Estimated Nutritional Needs:  Kcal:  1900-2100 Protein:  95-110g Fluid:  >/=1.9L  6/10 Dinner: Reported 50%, unsure of what 6/11 Breakfast: 187 calories, 5 gm protein 6/11 Lunch: not touched yet per RN, reports he doesn't eat it right away Supplements: 1015 calories, 69 gm protein  Total intake: 1202 kcal (63% of minimum estimated needs)  74 protein (78% of minimum estimated needs)  Nutrition Diagnosis: Moderate Malnutrition related to social / environmental circumstances (ETOH use, homeless) as evidenced by moderate fat depletion, moderate muscle depletion   Goal: Patient will meet greater than or equal to 90% of their need   Intervention:  Magic cup TID with meals, each supplement provides 290 kcal and 9 grams of protein Ensure Plus High Protein po TID, each supplement provides 350 kcal and 20 grams of protein 30 ml ProSource Plus BID, each supplement provides 100 kcals and 15 grams protein.     Doneta Furbish RD, LDN Clinical Dietitian

## 2023-12-29 NOTE — TOC Progression Note (Signed)
 Transition of Care (TOC) - Progression Note    Patient Details  Name: Anthony A Lusty Jr. MRN: [id number] Date of Birth: [birth year]  Transition of Care [facility]) CM/SW Contact  Arron Big, [place] Phone Number: 12/29/2023, 11:14 AM  Clinical Narrative:   Barrier for SNF placement remain in place -  Awaiting removal of cortrak.   TOC will continue to follow.    Expected Discharge Plan: Skilled Nursing Facility Barriers to Discharge: Continued Medical Work up  Expected Discharge Plan and Services In-house Referral: Clinical Social Work     Living arrangements for the past 2 months: Homeless                                       Social Determinants of Health (SDOH) Interventions SDOH Screenings   Food Insecurity: Patient Unable To Answer (12/09/2023)  Housing: Patient Unable To Answer (12/09/2023)  Transportation Needs: Patient Unable To Answer (12/09/2023)  Utilities: Patient Unable To Answer (12/09/2023)  Depression (PHQ2-9): Medium Risk (10/16/2020)  Social Connections: Unknown (12/22/2023)  Tobacco Use: High Risk (12/08/2023)    Readmission Risk Interventions    08/27/2023   12:21 PM 08/19/2023    9:17 AM 07/12/2023    9:58 AM  Readmission Risk Prevention Plan  Transportation Screening Complete Complete Complete  Medication Review Oceanographer) Complete Complete Complete  PCP or Specialist appointment within 3-5 days of discharge Complete  --  HRI or Home Care Consult Complete Complete Complete  SW Recovery Care/Counseling Consult Complete Complete Complete  Palliative Care Screening Not Applicable Not Applicable Not Applicable  Skilled Nursing Facility Not Applicable Not Applicable Not Applicable

## 2023-12-29 NOTE — Progress Notes (Signed)
 Progress Note   Patient: Anthony A Roberg Jr. WUJ:[id number] DOB: 09/06/1955 DOA: 12/07/2023     21 DOS: the patient was seen and examined on 12/29/2023   Brief hospital course: 68 year old man who presented to [facility] ED 5/20 for SOB. PMHx significant for HTN, aortic atherosclerosis, PAF, PE (previously on Eliquis ), EtOH abuse c/b withdrawal/DTs, tobacco abuse, hiatal hernia (s/p Nissen 2016), chronic low back pain, anxiety who is homeless, recently discharged after opiate withdrawal treatment presented to the ED for evaluation of delirium, hyperglycemia admitted to TRH service for EtOH withdrawal.  While waiting for bed placement patient became intermittently agitated requiring Ativan  per CIWA protocol, transferred to [facility] for intermittent agitation requiring Precedex .  Patient was placed on Precedex , Librium , clonazepam .  Patient is seen by GI, started on stress dose steroids.  Liver, hypoperfusion.  Patient is also seen by advanced heart failure team for biventricular heart failure.  He is transferred to TRH service for further management of hyperglycemia, hyponatremia.  Assessment and Plan: Alcohol  withdrawal, complicated Resolved, Ativan  as needed.   Continue folate, multivitamin, thiamine . Nutritional intake still 30%, dependant on cortrak. Wishes to eat soft moist food, advised RN to change order.   Hospital delirium MRI brain nothing acute but advanced atrophy, Neuro thinks hospital delirium superimposed on cognitive impairment. Treated here for possible wernicke. May also have hepatic encephalopathy as below Mental status stable.  Physical therapy evaluated him, able to ambulate.   A-fib Rvr this hospitalization, resolved Continue oral amiodarone  Anticoagulation on hold 2/2 retroperitoneal bleed   HFrEF EF 35-40 on tte. Appears euvolemic today Continue spiro   Homelessness- TOC working on safe discharge plan   History PE Was on apixaban , now held 2/2 bleed   MDD hold  brexiprazole   Hyponatremia- Na stable 127-131 Hold brexiprazole, aldactone . Encourage oral fluids, continue salt tabs.   Febrile illness. Resolved. No further work up needed.   Retroperitoneal hematoma Acute blood loss anemia Diagnosed 5/30, moderate in size. Has received total of 5 units last on 5/31, hgb now stable Anticoagulation on hold   Cirrhosis Hepatic encephalopathy Resume Lactulose  for 2-3 bowel movements.   Nutrition: Eating still poor. Calorie count per RD daily. Continue feeds, supplements. Nutritional intake chart reviewed, still around 30%, dependant on cortrak. Wishes to eat soft moist food, advised RN to change order.   Hypoglycemia Sugars better. Encourage oral diet, supplements. Hypoglycemia protocol.      Palliative on board for goals of care discussion. Out of bed to chair. Incentive spirometry. Nursing supportive care. Fall, aspiration precautions. Diet:  Diet Orders (From admission, onward)     Start     Ordered   12/23/23 1657  DIET SOFT Room service appropriate? Yes; Fluid consistency: Thin  Diet effective now       Question Answer Comment  Room service appropriate? Yes   Fluid consistency: Thin      12/23/23 1656           DVT prophylaxis:   Level of care: Progressive   Code Status: Limited: Do not attempt resuscitation (DNR) -DNR-LIMITED -Do Not Intubate/DNI   Subjective: Patient is seen and examined today morning. Able to drink, ate pudding. Wishes to drink more. No overnight issues.   Physical Exam: Vitals:   12/29/23 0038 12/29/23 0329 12/29/23 0805 12/29/23 1147  BP: 101/75 121/80 (!) 126/90 123/80  Pulse: 93 (!) 103 (!) 112 95  Resp: 20 20 14 17   Temp: 97.6 F (36.4 C) 97.6 F (36.4 C) 97.7 F (36.5 C) 97.7  F (36.5 C)  TempSrc: Oral Oral Oral Oral  SpO2: 98% 96% 98% 96%  Weight:  63.2 kg    Height:        General - Elderly weak Caucasian male, no apparent distress HEENT - PERRLA, EOMI, atraumatic head,  cortrak in place Lung - Clear, basal rales, rhonchi, no wheezes. Heart - S1, S2 heard, no murmurs, rubs, trace pedal edema. Abdomen - Soft, non tender, bowel sounds good Neuro - sleeping, arousable, non focal exam. Skin - Warm and dry.  Data Reviewed:      Latest Ref Rng & Units 12/29/2023    2:46 AM 12/25/2023    1:11 AM 12/24/2023    2:58 AM  CBC  WBC 4.0 - 10.5 K/uL 5.0  4.5  4.9   Hemoglobin 13.0 - 17.0 g/dL 9.9  9.7  8.7   Hematocrit 39.0 - 52.0 % 30.9  29.7  27.1   Platelets 150 - 400 K/uL 626  467  395       Latest Ref Rng & Units 12/29/2023    2:46 AM 12/28/2023    2:29 AM 12/27/2023    2:56 AM  BMP  Glucose 70 - 99 mg/dL 72  97  161   BUN 8 - 23 mg/dL 17  20  23    Creatinine 0.61 - 1.24 mg/dL 0.96  0.45  4.09   Sodium 135 - 145 mmol/L 127  127  130   Potassium 3.5 - 5.1 mmol/L 4.1  4.2  4.5   Chloride 98 - 111 mmol/L 98  97  102   CO2 22 - 32 mmol/L 22  22  20    Calcium  8.9 - 10.3 mg/dL 8.9  8.9  9.0    No results found.  Disposition: Status is: Inpatient Remains inpatient appropriate because: removal of cortrak, safe placement  Planned Discharge Destination: Skilled nursing facility     Time spent: 38 minutes  Author: Aisha Hove, MD 12/29/2023 1:12 PM Secure chat 7am to 7pm For on call review [email or web address].

## 2023-12-29 NOTE — Progress Notes (Signed)
 Physical Therapy Treatment Patient Details Name: Anthony A Music Jr. MRN: [id number] DOB: Jun 19, 1956 Today's Date: 12/29/2023   History of Present Illness Pt is a 68 y.o. male who presented 12/06/23 with SOB. Shortly after arriving, pt became agitated and was given IV Ativan . CT head is unremarkable. Patient admitted for EtOH withdrawal, acute delirium, and hypoglycemia. Cortrak placed 5/23.  The day before the patient had presented to ED requesting EtOH detox; patient was deemed to be not actively in EtOH withdrawal and was discharged, however upon discharge patient threatened staff and was escorted off of the premises by [employer]. PMH includes alcohol  abuse, HTN, CVA, HFmrEF, afib, PE, HLD, anxiety, depression, chronic low back pain.    PT Comments  Pt in bed upon arrival and agreeable to PT session with encouragement. Pt continues to be confused with tangential speech and frequent cues for re-direction/adherence to task at hand. Pt was agreeable to ambulate 30 ft with CGA/MinA and use of RW. Increased difficulty side-stepping with assist needed for RW management. Pt was able to perform pericare in sitting with supervision for safety. Continue to recommend <3hrs post acute rehab to work towards independence with mobility. Acute PT to follow.   BP 103/83 HR 110's    If plan is discharge home, recommend the following: A lot of help with bathing/dressing/bathroom;Assistance with cooking/housework;Direct supervision/assist for medications management;Direct supervision/assist for financial management;Assist for transportation;Help with stairs or ramp for entrance;Supervision due to cognitive status;A Lahmann help with walking and/or transfers   Can travel by private vehicle     Yes  Equipment Recommendations  Rollator (4 wheels);Wheelchair (measurements PT);Wheelchair cushion (measurements PT);BSC/3in1       Precautions / Restrictions Precautions Precautions: Fall Recall of Precautions/Restrictions:  Impaired Precaution/Restrictions Comments: cortrak; watch SpO2 Restrictions Weight Bearing Restrictions Per Provider Order: No     Mobility  Bed Mobility Overal bed mobility: Needs Assistance Bed Mobility: Supine to Sit, Sit to Supine    Supine to sit: Contact guard Sit to supine: Contact guard assist   General bed mobility comments: CGA for safety with no physical assist needed. Cues to scoot forward to bring feet flat on the ground.    Transfers Overall transfer level: Needs assistance Equipment used: Rolling walker (2 wheels) Transfers: Sit to/from Stand Sit to Stand: Contact guard assist    General transfer comment: CGA for slight steadying assist with cues for hand placement with RW.    Ambulation/Gait Ambulation/Gait assistance: Min assist, Contact guard assist Gait Distance (Feet): 30 Feet Assistive device: Rolling walker (2 wheels) Gait Pattern/deviations: Step-through pattern, Decreased stride length, Trunk flexed Gait velocity: reduced     General Gait Details: Short and slow steps with forward flexed posture. MinA for RW management when taking sideways steps to enter bathroom.      Balance Overall balance assessment: Needs assistance Sitting-balance support: No upper extremity supported, Feet supported Sitting balance-Leahy Scale: Fair     Standing balance support: Reliant on assistive device for balance Standing balance-Leahy Scale: Poor Standing balance comment: reliant on RW and external support     Communication Communication Communication: Impaired Factors Affecting Communication: Reduced clarity of speech  Cognition Arousal: Alert Behavior During Therapy: Flat affect   PT - Cognitive impairments: No family/caregiver present to determine baseline, Awareness, Memory, Attention, Problem solving, Safety/Judgement    PT - Cognition Comments: Confused throughout session. Tangential in speech requiring frequent cues for redirection and adherance to  task at hand Following commands: Impaired Following commands impaired: Follows one step commands with increased  time    Cueing Cueing Techniques: Verbal cues, Tactile cues, Visual cues     General Comments General comments (skin integrity, edema, etc.): VSS on RA      Pertinent Vitals/Pain Pain Assessment Pain Assessment: No/denies pain     PT Goals (current goals can now be found in the care plan section) Acute Rehab PT Goals PT Goal Formulation: With patient Time For Goal Achievement: 01/02/24 Potential to Achieve Goals: Good Progress towards PT goals: Progressing toward goals    Frequency    Min 2X/week       AM-PAC PT 6 Clicks Mobility   Outcome Measure  Help needed turning from your back to your side while in a flat bed without using bedrails?: A Mcphee Help needed moving from lying on your back to sitting on the side of a flat bed without using bedrails?: A Ezelle Help needed moving to and from a bed to a chair (including a wheelchair)?: A Neddo Help needed standing up from a chair using your arms (e.g., wheelchair or bedside chair)?: A Michalowski Help needed to walk in hospital room?: A Toepfer Help needed climbing 3-5 steps with a railing? : A Hari 6 Click Score: 18    End of Session Equipment Utilized During Treatment: Gait belt Activity Tolerance: Patient tolerated treatment well Patient left: in bed;with call bell/phone within reach;with bed alarm set Nurse Communication: Mobility status PT Visit Diagnosis: Unsteadiness on feet (R26.81);Other abnormalities of gait and mobility (R26.89);Muscle weakness (generalized) (M62.81);Difficulty in walking, not elsewhere classified (R26.2)     Time: 1610-9604 PT Time Calculation (min) (ACUTE ONLY): 20 min  Charges:    $Gait Training: 8-22 mins PT General Charges $$ ACUTE PT VISIT: 1 Visit                    Anthony [name], PT, DPT Secure Chat Preferred  Rehab Office [phone]   Anthony [name] 12/29/2023,  11:14 AM

## 2023-12-30 DIAGNOSIS — F10931 Alcohol use, unspecified with withdrawal delirium: Secondary | ICD-10-CM | POA: Diagnosis not present

## 2023-12-30 DIAGNOSIS — E872 Acidosis, unspecified: Secondary | ICD-10-CM | POA: Diagnosis not present

## 2023-12-30 DIAGNOSIS — E440 Moderate protein-calorie malnutrition: Secondary | ICD-10-CM | POA: Diagnosis not present

## 2023-12-30 DIAGNOSIS — Z86711 Personal history of pulmonary embolism: Secondary | ICD-10-CM | POA: Diagnosis not present

## 2023-12-30 LAB — CBC
HCT: 30 % — ABNORMAL LOW (ref 39.0–52.0)
Hemoglobin: 9.8 g/dL — ABNORMAL LOW (ref 13.0–17.0)
MCH: 30.3 pg (ref 26.0–34.0)
MCHC: 32.7 g/dL (ref 30.0–36.0)
MCV: 92.9 fL (ref 80.0–100.0)
Platelets: 613 10*3/uL — ABNORMAL HIGH (ref 150–400)
RBC: 3.23 MIL/uL — ABNORMAL LOW (ref 4.22–5.81)
RDW: 19.1 % — ABNORMAL HIGH (ref 11.5–15.5)
WBC: 4.1 10*3/uL (ref 4.0–10.5)
nRBC: 0 % (ref 0.0–0.2)

## 2023-12-30 LAB — GLUCOSE, CAPILLARY
Glucose-Capillary: 89 mg/dL (ref 70–99)
Glucose-Capillary: 93 mg/dL (ref 70–99)
Glucose-Capillary: 211 mg/dL — ABNORMAL HIGH (ref 70–99)
Glucose-Capillary: 123 mg/dL — ABNORMAL HIGH (ref 70–99)

## 2023-12-30 MED ORDER — MELATONIN 3 MG PO TABS
3.0000 mg | ORAL_TABLET | Freq: Once | ORAL | Status: AC
  Administered 2023-12-30: 3 mg via ORAL
  Filled 2023-12-30: qty 1

## 2023-12-30 MED ORDER — SODIUM CHLORIDE 0.9 % IV BOLUS
500.0000 mL | Freq: Once | INTRAVENOUS | Status: AC
  Administered 2023-12-30: 500 mL via INTRAVENOUS

## 2023-12-30 NOTE — Progress Notes (Signed)
 Physical Therapy Treatment Patient Details Name: Anthony A Passow Jr. MRN: [id number] DOB: May 02, 1956 Today's Date: 12/30/2023   History of Present Illness Pt is a 68 y.o. male who presented 12/06/23 with SOB. Shortly after arriving, pt became agitated and was given IV Ativan . CT head is unremarkable. Patient admitted for EtOH withdrawal, acute delirium, and hypoglycemia. Cortrak placed 5/23.  The day before the patient had presented to ED requesting EtOH detox; patient was deemed to be not actively in EtOH withdrawal and was discharged, however upon discharge patient threatened staff and was escorted off of the premises by [employer]. PMH includes alcohol  abuse, HTN, CVA, HFmrEF, afib, PE, HLD, anxiety, depression, chronic low back pain.    PT Comments  Pt greeted supine in bed, pleasant and agreeable to PT session. He engaged in gait training using RW and stair training using railings. He ambulated ~223ft and ascended/descend a flight of stairs with CGA. Pt required moderate cues throughout for increased safety awareness and proper sequencing. He was tangential in speech, but could be redirection to focus on task. Will continue to follow acutely and advance appropriately. Patient will benefit from continued inpatient follow up therapy, <3 hours/day.     If plan is discharge home, recommend the following: A lot of help with bathing/dressing/bathroom;Assistance with cooking/housework;Direct supervision/assist for medications management;Direct supervision/assist for financial management;Assist for transportation;Help with stairs or ramp for entrance;Supervision due to cognitive status;A Lien help with walking and/or transfers   Can travel by private vehicle     Yes  Equipment Recommendations  Rollator (4 wheels);BSC/3in1    Recommendations for Other Services       Precautions / Restrictions Precautions Precautions: Fall Recall of Precautions/Restrictions: Intact Restrictions Weight Bearing  Restrictions Per Provider Order: No     Mobility  Bed Mobility Overal bed mobility: Needs Assistance Bed Mobility: Supine to Sit, Sit to Supine     Supine to sit: Contact guard Sit to supine: Contact guard assist   General bed mobility comments: Pt performed supine<>sit with increased time and no use of bedrails. CGA for safety, no physical assist.    Transfers Overall transfer level: Needs assistance Equipment used: Rolling walker (2 wheels) Transfers: Sit to/from Stand Sit to Stand: Contact guard assist           General transfer comment: Cued pt to scoot fwd til feet flat on floor and for hand placement using RW. He required CGA for steadying as he transitioned into standing. Good eccentric control with sitting.    Ambulation/Gait Ambulation/Gait assistance: Contact guard assist Gait Distance (Feet): 200 Feet Assistive device: Rolling walker (2 wheels) Gait Pattern/deviations: Step-through pattern, Decreased stride length, Trunk flexed Gait velocity: reduced Gait velocity interpretation: <1.8 ft/sec, indicate of risk for recurrent falls   General Gait Details: Pt ambulated with slow, short steps. He maintained a fwd flex posture with fixed gaze on the floor. Cued pt to increse upright posture and look ahead, but minimal correction noted. CGA for safety/stability.   Stairs Stairs: Yes Stairs assistance: Contact guard assist Stair Management: Two rails, Forwards, Step to pattern Number of Stairs: 10 General stair comments: Pt ascended/descended one flight of stairs with BUE support on rails. He alternated his leading leg and required moderate cues for increased safety awareness and sequencing. Pt took a standing rest break at the top before coming back down. No LOB.   Wheelchair Mobility     Tilt Bed    Modified Rankin (Stroke Patients Only)       Balance  Overall balance assessment: Needs assistance Sitting-balance support: No upper extremity supported,  Feet supported Sitting balance-Leahy Scale: Fair     Standing balance support: Bilateral upper extremity supported, During functional activity, Reliant on assistive device for balance Standing balance-Leahy Scale: Poor Standing balance comment: Pt dependent on RW during transfers/gait and rails during stairs. CGA required for safety/stability.                            Communication Communication Communication: Impaired Factors Affecting Communication: Reduced clarity of speech  Cognition Arousal: Alert Behavior During Therapy: Flat affect   PT - Cognitive impairments: No family/caregiver present to determine baseline, Awareness, Memory, Attention, Problem solving, Safety/Judgement                       PT - Cognition Comments: Pt pleasantly confused. His speech is tangential, but able to be redirected with cueing. Following commands: Impaired Following commands impaired: Follows one step commands with increased time    Cueing Cueing Techniques: Verbal cues, Tactile cues, Visual cues  Exercises      General Comments General comments (skin integrity, edema, etc.): VSS on RA      Pertinent Vitals/Pain Pain Assessment Pain Assessment: No/denies pain Pain Intervention(s): Monitored during session    Home Living                          Prior Function            PT Goals (current goals can now be found in the care plan section) Acute Rehab PT Goals Patient Stated Goal: Regain my independence Progress towards PT goals: Progressing toward goals    Frequency    Min 2X/week      PT Plan      Co-evaluation              AM-PAC PT 6 Clicks Mobility   Outcome Measure  Help needed turning from your back to your side while in a flat bed without using bedrails?: A Berzins Help needed moving from lying on your back to sitting on the side of a flat bed without using bedrails?: A Bloor Help needed moving to and from a bed to a chair  (including a wheelchair)?: A Mcelhannon Help needed standing up from a chair using your arms (e.g., wheelchair or bedside chair)?: A Simerson Help needed to walk in hospital room?: A Driver Help needed climbing 3-5 steps with a railing? : A [place] 6 Click Score: 18    End of Session Equipment Utilized During Treatment: Gait belt Activity Tolerance: Patient tolerated treatment well Patient left: in bed;with call bell/phone within reach;with bed alarm set Nurse Communication: Mobility status PT Visit Diagnosis: Unsteadiness on feet (R26.81);Other abnormalities of gait and mobility (R26.89);Muscle weakness (generalized) (M62.81);Difficulty in walking, not elsewhere classified (R26.2)     Time: 1610-9604 PT Time Calculation (min) (ACUTE ONLY): 24 min  Charges:    $Gait Training: 23-37 mins PT General Charges $$ ACUTE PT VISIT: 1 Visit                     Glenford Lanes, PT, DPT Acute Rehabilitation Services Office: [phone] Secure Chat Preferred  Riva Chester 12/30/2023, 2:31 PM

## 2023-12-30 NOTE — Progress Notes (Signed)
 Please be advised that the above-named patient will require a short-term nursing home stay-anticipated 30 days or less for rehabilitation and strengthening. The plan is for return home.

## 2023-12-30 NOTE — Progress Notes (Signed)
 Occupational Therapy Treatment Patient Details Name: Anthony A Flammer Jr. MRN: [id number] DOB: 01-13-1956 Today's Date: 12/30/2023   History of present illness Pt is a 68 y.o. male who presented 12/06/23 with SOB. Shortly after arriving, pt became agitated and was given IV Ativan . CT head is unremarkable. Patient admitted for EtOH withdrawal, acute delirium, and hypoglycemia. Cortrak placed 5/23.  The day before the patient had presented to ED requesting EtOH detox; patient was deemed to be not actively in EtOH withdrawal and was discharged, however upon discharge patient threatened staff and was escorted off of the premises by [employer]. PMH includes alcohol  abuse, HTN, CVA, HFmrEF, afib, PE, HLD, anxiety, depression, chronic low back pain.   OT comments  Patient received in supine and agreeable to OT treatment. Patient was CGA to get to EOB and performed LB dressing with donning socks. Patient able to donn sock on LLE but unable to for RLE. Patient ambulated to sink for self care tasks and was able to stand for grooming task but required seated rest break following and completed self care tasks seated before ambulating to recliner with CGA. Patient will benefit from continued inpatient follow up therapy, <3 hours/day. Acute OT to continue to follow to address established goals to facilitate DC to next venue of care.        If plan is discharge home, recommend the following:  Assist for transportation;A lot of help with bathing/dressing/bathroom;A lot of help with walking and/or transfers   Equipment Recommendations  None recommended by OT    Recommendations for Other Services      Precautions / Restrictions Precautions Precautions: Fall Recall of Precautions/Restrictions: Impaired Precaution/Restrictions Comments: watch SpO2 Restrictions Weight Bearing Restrictions Per Provider Order: No       Mobility Bed Mobility Overal bed mobility: Needs Assistance Bed Mobility: Supine to Sit, Sit to  Supine     Supine to sit: Contact guard, Used rails     General bed mobility comments: increased time and use of bed rails    Transfers Overall transfer level: Needs assistance Equipment used: Rolling walker (2 wheels) Transfers: Sit to/from Stand Sit to Stand: Contact guard assist           General transfer comment: CGA and cues for safety and hand placement     Balance Overall balance assessment: Needs assistance Sitting-balance support: No upper extremity supported, Feet supported Sitting balance-Leahy Scale: Fair Sitting balance - Comments: able to donn socks seated on EOB   Standing balance support: Reliant on assistive device for balance, Single extremity supported Standing balance-Leahy Scale: Poor Standing balance comment: reliant on RW while standing at sink                           ADL either performed or assessed with clinical judgement   ADL Overall ADL's : Needs assistance/impaired     Grooming: Wash/dry hands;Wash/dry face;Contact guard assist;Standing Grooming Details (indicate cue type and reason): required seated rest break following Upper Body Bathing: Set up;Sitting   Lower Body Bathing: Contact guard assist;Minimal assistance;Sit to/from stand   Upper Body Dressing : Set up;Sitting Upper Body Dressing Details (indicate cue type and reason): gown change Lower Body Dressing: Moderate assistance;Sitting/lateral leans Lower Body Dressing Details (indicate cue type and reason): able to doff socks and donn on LLE but requried assistance with right sock while seated on EOB             Functional mobility during ADLs: Contact  guard assist General ADL Comments: required increased time to rest after standing at sink for grooming    Extremity/Trunk Assessment              Vision       Perception     Praxis     Communication Communication Communication: Impaired Factors Affecting Communication: Reduced clarity of speech    Cognition Arousal: Alert Behavior During Therapy: Flat affect Cognition: Cognition impaired     Awareness: Intellectual awareness intact Memory impairment (select all impairments): Short-term memory, Working memory Attention impairment (select first level of impairment): Sustained attention Executive functioning impairment (select all impairments): Reasoning, Problem solving                   Following commands: Impaired Following commands impaired: Follows one step commands with increased time      Cueing   Cueing Techniques: Verbal cues, Tactile cues, Visual cues  Exercises      Shoulder Instructions       General Comments BP at end of session in recliner 86/65 (74) following 5 minutes 97/66 (74)    Pertinent Vitals/ Pain       Pain Assessment Pain Assessment: No/denies pain Pain Intervention(s): Monitored during session  Home Living                                          Prior Functioning/Environment              Frequency  Min 2X/week        Progress Toward Goals  OT Goals(current goals can now be found in the care plan section)  Progress towards OT goals: Progressing toward goals  Acute Rehab OT Goals Patient Stated Goal: none stated OT Goal Formulation: Patient unable to participate in goal setting Time For Goal Achievement: 01/03/24 Potential to Achieve Goals: Fair ADL Goals Pt Will Perform Grooming: with modified independence;standing Pt Will Perform Lower Body Dressing: with modified independence;sit to/from stand Pt Will Transfer to Toilet: regular height toilet;ambulating;with modified independence  Plan      Co-evaluation                 AM-PAC OT 6 Clicks Daily Activity     Outcome Measure   Help from another person eating meals?: None Help from another person taking care of personal grooming?: A Fischel Help from another person toileting, which includes using toliet, bedpan, or urinal?: A  [place] Help from another person bathing (including washing, rinsing, drying)?: A [place] Help from another person to put on and taking off regular upper body clothing?: A Malczewski Help from another person to put on and taking off regular lower body clothing?: A Soutar 6 Click Score: 19    End of Session Equipment Utilized During Treatment: Rolling walker (2 wheels)  OT Visit Diagnosis: Unsteadiness on feet (R26.81);Muscle weakness (generalized) (M62.81)   Activity Tolerance Patient tolerated treatment well   Patient Left in chair;with call bell/phone within reach;with chair alarm set   Nurse Communication Other (comment) (defer)        Time: 1610-9604 OT Time Calculation (min): 29 min  Charges: OT General Charges $OT Visit: 1 Visit OT Treatments $Self Care/Home Management : 23-37 mins  Anitra Barn, OTA Acute Rehabilitation Services  Office [phone]   Jovita Nipper 12/30/2023, 12:48 PM

## 2023-12-30 NOTE — NC FL2 (Addendum)
 [employer]  MEDICAID FL2 LEVEL OF CARE FORM     IDENTIFICATION  Patient Name: Anthony A Mangiaracina Jr. Birthdate: 1956/03/08 Sex: male Admission Date (Current Location): 12/07/2023  [facility] and [place] Number:  Producer, television/film/video and Address:  [facility], [address], [place], [place] [address]      Provider Number: [id number]  Attending Physician Name and Address:  Aisha Hove, MD  Relative Name and Phone Number:       Current Level of Care: Hospital Recommended Level of Care: Skilled Nursing Facility Prior Approval Number:    Date Approved/Denied:   PASRR Number: [id number]  Discharge Plan: SNF    Current Diagnoses: Patient Active Problem List   Diagnosis Date Noted   Acute delirium 12/08/2023   Agitation 07/29/2023   History of CVA (cerebrovascular accident) 07/11/2023   Acute pulmonary embolism (HCC) 07/08/2023   Lactic acidosis 07/08/2023   Rib fractures 02/04/2023   Hypoglycemia 02/04/2023   Pulmonary embolus (HCC) 02/04/2023   Pulmonary embolism (HCC) 02/03/2023   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 difficile colitis 09/11/2022   Aspiration pneumonia (HCC) 08/27/2022   Aortic atherosclerosis (HCC) 08/27/2022   Depression, unspecified 08/09/2022   Hyponatremia 06/01/2022   Non compliance w medication regimen 05/31/2022   Delirium 05/25/2022   Stricture and stenosis of esophagus 05/21/2022   Gastritis and gastroduodenitis 05/21/2022   Loose stools 05/20/2022   Malnutrition of moderate degree 05/20/2022   Acute on chronic heart failure (HCC)    Leukocytosis 05/19/2022   Sepsis (HCC) 05/19/2022   Cellulitis of left leg, possible  05/19/2022   Moniliasis, interdigital 05/19/2022   Open toe wound, right foot, medial fourth digit 05/19/2022   Malnutrition (HCC) 05/19/2022   Alcohol  abuse 03/24/2022   Dyslipidemia 03/24/2022   Acute on chronic diastolic CHF (congestive heart failure) (HCC) 03/24/2022   Acute respiratory failure with hypoxia (HCC) 03/23/2022   Generalized weakness    Atrial fibrillation with rapid ventricular response (HCC) 03/15/2022   Hypokalemia 03/15/2022   Decreased functional mobility and endurance 06/22/2021   Iron  deficiency anemia 10/16/2020   Cough    Hypomagnesemia    Protein-calorie malnutrition, severe 08/22/2020   Alcohol  use disorder, severe, in controlled environment (HCC)    Pressure injury of skin 08/18/2020   Urinary tract infection without hematuria    Severe sepsis (HCC) 08/14/2020   Paroxysmal atrial fibrillation (HCC) 08/13/2020   Rhabdomyolysis 07/22/2020   Cerebral ventriculomegaly 07/22/2020   Hemoglobin decreased 07/22/2020   History of delirium tremons 07/22/2020   Hypoalbuminemia due to protein-calorie malnutrition (HCC) 07/22/2020   Tobacco use disorder 07/22/2020   Alcohol  withdrawal delirium (HCC)    Dehydration    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) 07/18/2018   Depression 05/26/2018   Anxiety state 04/25/2018   Need for immunization against influenza 04/25/2018   Alcohol  withdrawal (HCC) 09/01/2017   DOE (dyspnea on exertion) 11/27/2016   Actinic keratosis 11/27/2016   Macrocytic anemia 11/23/2016   History of pulmonary embolus (PE) 11/02/2016   Hiatal hernia 09/14/2016   Skin cancer 08/13/2016   Poor social situation 06/09/2013   Tinea versicolor 06/08/2013   Back pain 05/07/2013   Seborrheic dermatitis of scalp 11/08/2012   HTN (hypertension) 04/11/2012  Alcohol  use disorder, severe, dependence (HCC) 09/16/2006    Orientation RESPIRATION BLADDER Height & Weight     Self, Place  Normal Incontinent, External catheter Weight:  139 lb 5.3 oz (63.2 kg) Height:  5' 6 (167.6 cm)  BEHAVIORAL SYMPTOMS/MOOD NEUROLOGICAL BOWEL NUTRITION STATUS      Continent Diet (see dc summary)  AMBULATORY STATUS COMMUNICATION OF NEEDS Skin   Extensive Assist Verbally PU Stage and Appropriate Care, Other (Comment) (Pressure Injury - Tibial Distal, Left, Posterior Stage 3; Wound / Incision - (IAD) Incontinence Associated Dermatitis Rectum Medial;Circumferential red rashlike area around anus from stool incontinence)                       Personal Care Assistance Level of Assistance  Bathing, Feeding, Dressing Bathing Assistance: Limited assistance Feeding assistance: Limited assistance Dressing Assistance: Limited assistance     Functional Limitations Info  Sight, Hearing, Speech Sight Info: Impaired (eyeglasses) Hearing Info: Adequate Speech Info: Adequate    SPECIAL CARE FACTORS FREQUENCY  PT (By licensed PT), OT (By licensed OT)     PT Frequency: 5x week OT Frequency: 5x week            Contractures Contractures Info: Not present    Additional Factors Info  Code Status, Allergies Code Status Info: DNR Limited Allergies Info: Poison Ivy Extract ; Seasonal allergies- Itchy eyes, runny nose, congestion           Current Medications (12/30/2023):  This is the current hospital active medication list Current Facility-Administered Medications  Medication Dose Route Frequency Provider Last Rate Last Admin   (feeding supplement) PROSource Plus liquid 30 mL  30 mL Oral BID BM Sreeram, Narendranath, MD   30 mL at 12/29/23 1341   0.9 %  sodium chloride  infusion (Manually program via Guardrails IV Fluids)   Intravenous Once Junious Oliphant T, MD       acetaminophen  (TYLENOL ) tablet 650 mg  650 mg Oral Q6H PRN Janeane Mealy, MD   650 mg at 12/30/23 0230   amiodarone  (PACERONE ) tablet 200 mg  200 mg Oral BID Janeane Mealy, MD   200 mg at 12/29/23 2103   feeding supplement (ENSURE PLUS HIGH PROTEIN) liquid 237 mL   237 mL Oral TID BM Janeane Mealy, MD   237 mL at 12/29/23 1922   folic acid  (FOLVITE ) tablet 1 mg  1 mg Oral Daily Wouk, Haynes Lips, MD   1 mg at 12/29/23 1014   Gerhardt's butt cream   Topical BID Josiah Nigh, MD   Given at 12/29/23 2103   lidocaine  (LIDODERM ) 5 % 1 patch  1 patch Transdermal Q24H Josiah Nigh, MD   1 patch at 12/29/23 1221   loratadine  (CLARITIN ) tablet 10 mg  10 mg Oral Daily Sreeram, Narendranath, MD   10 mg at 12/29/23 1014   magnesium  oxide (MAG-OX) tablet 400 mg  400 mg Oral Daily Sreeram, Narendranath, MD   400 mg at 12/29/23 1014   OLANZapine  zydis (ZYPREXA ) disintegrating tablet 5 mg  5 mg Oral BID PRN Akintayo, Musa A, MD       Oral care mouth rinse  15 mL Mouth Rinse 4 times per day Josiah Nigh, MD   15 mL at 12/28/23 1033   pantoprazole  (PROTONIX ) EC tablet 40 mg  40 mg Oral Daily Bitonti, Michael T, RPH   40 mg at 12/29/23 1015   sodium chloride  flush (NS) 0.9 % injection 10-40 mL  10-40 mL Intracatheter Q12H Claven Cumming, MD   10 mL at 12/29/23 2103   sodium chloride  flush (NS) 0.9 % injection 10-40 mL  10-40 mL Intracatheter PRN Claven Cumming, MD       sodium chloride  tablet 1 g  1 g Oral TID WC Sreeram, Narendranath, MD   1 g at 12/29/23 1758   thiamine  (VITAMIN B1) tablet 100 mg  100 mg Oral Daily Janeane Mealy, MD   100 mg at 12/29/23 1017     Discharge Medications: Please see discharge summary for a list of discharge medications.  Relevant Imaging Results:  Relevant Lab Results:   Additional Information [id number]  Roslyn Else C Mariame Rybolt, LCSWA
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
## 2023-12-30 NOTE — Progress Notes (Signed)
 Progress Note   Patient: Anthony A Doscher Jr. KGU:[id number] DOB: July 28, 1955 DOA: 12/07/2023     22 DOS: the patient was seen and examined on 12/30/2023   Brief hospital course: 68 year old man who presented to [facility] ED 5/20 for SOB. PMHx significant for HTN, aortic atherosclerosis, PAF, PE (previously on Eliquis ), EtOH abuse c/b withdrawal/DTs, tobacco abuse, hiatal hernia (s/p Nissen 2016), chronic low back pain, anxiety who is homeless, recently discharged after opiate withdrawal treatment presented to the ED for evaluation of delirium, hyperglycemia admitted to TRH service for EtOH withdrawal.  While waiting for bed placement patient became intermittently agitated requiring Ativan  per CIWA protocol, transferred to [facility] for intermittent agitation requiring Precedex .  Patient was placed on Precedex , Librium , clonazepam .  Patient is seen by GI, started on stress dose steroids.  Liver, hypoperfusion.  Patient is also seen by advanced heart failure team for biventricular heart failure.  He is transferred to TRH service for further management of hyperglycemia, hyponatremia.  Assessment and Plan: Alcohol  withdrawal, resolved, Ativan  as needed.   Continue folate, multivitamin, thiamine . Nutritional intake still 30%, dependant on cortrak. Wishes to eat soft moist food, advised RN to change order.   Hospital delirium MRI brain nothing acute but advanced atrophy, Neuro thinks hospital delirium superimposed on cognitive impairment. Treated here for possible wernicke. May also have hepatic encephalopathy as below Mental status stable.  Physical therapy evaluated him, able to ambulate.   A-fib Rvr this hospitalization, resolved Continue oral amiodarone  Anticoagulation on hold 2/2 retroperitoneal bleed   HFrEF EF 35-40 on tte. Appears euvolemic today BP lower side caution with antihypertensive meds,   Homelessness- TOC working on safe discharge plan   History PE Was on apixaban , now held 2/2 bleed    MDD hold brexiprazole   Hyponatremia- Na stable 127-131 Hold brexiprazole, aldactone . Encourage oral fluids, continue salt tabs.   Febrile illness. Resolved. No further work up needed.   Retroperitoneal hematoma Acute blood loss anemia Diagnosed 5/30, moderate in size. Has received total of 5 units last on 5/31, hgb now stable Anticoagulation on hold   Cirrhosis Hepatic encephalopathy Resume Lactulose  for 2-3 bowel movements.   Nutrition: Eating fair. Cortrak removed 12/30/23. Encourage oral feeds, supplements. Wishes to eat soft moist food, advised RN to change order.   Hypoglycemia Sugars better. Encourage oral diet, supplements. Hypoglycemia protocol.      Palliative on board for goals of care discussion. Out of bed to chair. Incentive spirometry. Nursing supportive care. Fall, aspiration precautions. Diet:  Diet Orders (From admission, onward)     Start     Ordered   12/29/23 1344  DIET DYS 3 Room service appropriate? Yes; Fluid consistency: Thin  Diet effective now       Comments: Extra gravies and sauces.  Question Answer Comment  Room service appropriate? Yes   Fluid consistency: Thin      12/29/23 1345           DVT prophylaxis:   Level of care: Progressive   Code Status: Limited: Do not attempt resuscitation (DNR) -DNR-LIMITED -Do Not Intubate/DNI   Subjective: Patient is seen and examined today morning. Eating fair. BP lower side per RN. Cotrak removed, fluid bolus ordered.  Physical Exam: Vitals:   12/30/23 0727 12/30/23 1018 12/30/23 1023 12/30/23 1052  BP: 118/85 (!) 86/65 91/66 (!) 76/65  Pulse: (!) 106   (!) 105  Resp: 17 13 (!) 21 20  Temp: 97.7 F (36.5 C)     TempSrc: Oral  SpO2: 98%   98%  Weight:      Height:        General - Elderly weak Caucasian male, no apparent distress HEENT - PERRLA, EOMI, atraumatic head, cortrak in place Lung - Clear, basal rales, rhonchi, no wheezes. Heart - S1, S2 heard, no murmurs,  rubs, trace pedal edema. Abdomen - Soft, non tender, bowel sounds good Neuro - sleeping, arousable, non focal exam. Skin - Warm and dry.  Data Reviewed:      Latest Ref Rng & Units 12/30/2023    2:51 AM 12/29/2023    2:46 AM 12/25/2023    1:11 AM  CBC  WBC 4.0 - 10.5 K/uL 4.1  5.0  4.5   Hemoglobin 13.0 - 17.0 g/dL 9.8  9.9  9.7   Hematocrit 39.0 - 52.0 % 30.0  30.9  29.7   Platelets 150 - 400 K/uL 613  626  467       Latest Ref Rng & Units 12/29/2023    2:46 AM 12/28/2023    2:29 AM 12/27/2023    2:56 AM  BMP  Glucose 70 - 99 mg/dL 72  97  295   BUN 8 - 23 mg/dL 17  20  23    Creatinine 0.61 - 1.24 mg/dL 6.21  3.08  6.57   Sodium 135 - 145 mmol/L 127  127  130   Potassium 3.5 - 5.1 mmol/L 4.1  4.2  4.5   Chloride 98 - 111 mmol/L 98  97  102   CO2 22 - 32 mmol/L 22  22  20    Calcium  8.9 - 10.3 mg/dL 8.9  8.9  9.0    No results found.  Disposition: Status is: Inpatient Remains inpatient appropriate because: cortrak removed, pending, safe placement  Planned Discharge Destination: Skilled nursing facility, [facility] signed.     Time spent: 38 minutes  Author: Aisha Hove, MD 12/30/2023 3:47 PM Secure chat 7am to 7pm For on call review [email or web address].

## 2023-12-31 DIAGNOSIS — F10931 Alcohol use, unspecified with withdrawal delirium: Secondary | ICD-10-CM | POA: Diagnosis not present

## 2023-12-31 DIAGNOSIS — Z86711 Personal history of pulmonary embolism: Secondary | ICD-10-CM | POA: Diagnosis not present

## 2023-12-31 DIAGNOSIS — E440 Moderate protein-calorie malnutrition: Secondary | ICD-10-CM | POA: Diagnosis not present

## 2023-12-31 DIAGNOSIS — E872 Acidosis, unspecified: Secondary | ICD-10-CM | POA: Diagnosis not present

## 2023-12-31 LAB — CBC
HCT: 29.5 % — ABNORMAL LOW (ref 39.0–52.0)
Hemoglobin: 9.4 g/dL — ABNORMAL LOW (ref 13.0–17.0)
MCH: 30 pg (ref 26.0–34.0)
MCHC: 31.9 g/dL (ref 30.0–36.0)
MCV: 94.2 fL (ref 80.0–100.0)
Platelets: 608 10*3/uL — ABNORMAL HIGH (ref 150–400)
RBC: 3.13 MIL/uL — ABNORMAL LOW (ref 4.22–5.81)
RDW: 19.3 % — ABNORMAL HIGH (ref 11.5–15.5)
WBC: 4.5 10*3/uL (ref 4.0–10.5)
nRBC: 0 % (ref 0.0–0.2)

## 2023-12-31 LAB — GLUCOSE, CAPILLARY
Glucose-Capillary: 100 mg/dL — ABNORMAL HIGH (ref 70–99)
Glucose-Capillary: 100 mg/dL — ABNORMAL HIGH (ref 70–99)
Glucose-Capillary: 118 mg/dL — ABNORMAL HIGH (ref 70–99)
Glucose-Capillary: 126 mg/dL — ABNORMAL HIGH (ref 70–99)
Glucose-Capillary: 169 mg/dL — ABNORMAL HIGH (ref 70–99)

## 2023-12-31 MED ORDER — APIXABAN 5 MG PO TABS
5.0000 mg | ORAL_TABLET | Freq: Two times a day (BID) | ORAL | Status: DC
  Administered 2023-12-31 – 2024-02-28 (×118): 5 mg via ORAL
  Filled 2023-12-31 (×31): qty 1
  Filled 2023-12-31: qty 2
  Filled 2023-12-31 (×69): qty 1
  Filled 2023-12-31: qty 2
  Filled 2023-12-31: qty 1
  Filled 2023-12-31: qty 2
  Filled 2023-12-31 (×14): qty 1

## 2023-12-31 NOTE — TOC Progression Note (Addendum)
 Transition of Care (TOC) - Progression Note    Patient Details  Name: Anthony A Pesci Jr. MRN: [id number] Date of Birth: 1955-10-21  Transition of Care [facility]) CM/SW Contact  Arron Big, [place] Phone Number: 12/31/2023, 10:23 AM  Clinical Narrative:   CSW called and completed APS report with Lundy Salisbury. Per Mr. Annah Barre, the report will go to APS supervisors for review, a decision will be made about how APS will proceed, and a letter will be emailed to CSW with the determination.   11:08 AM Per MD, Patient is asking appropriate questions today and is agreeable to SNF. Psych to be consulted to determine capacity. Patient asked to speak with CSW, will follow up.   CSW asked MD if they are agreeable to do peer to peer in the event insurance asks for one, MD stated yes.   11:36 AM Josefina Nian with APS called CSW and would like to visit patient.   12:30PM Per unit secretary, Josefina Nian with APS [phone]) came by to visit patient at bedside. CSW awaiting follow up call. CSW met patient at bedside to discuss discharge plan as per MD he is agreeable to SNF. Patient initially stated he was agreeable to going to SNF to CSW. He then stated he does not want to pay all that money to go to SNF. CSW re explained that insurance would cover SNF stay if they approve - patient stated that is still my money, dont let anyone tell you it isnt. CSW asked patient a second time if he wants to go to SNF and patient stated he would rather pay $300 to go to a boarding house or group home. He went on to say a group home would cost a lot as well. CSW followed up with NCM regarding boarding house information. MD notified. Awaiting psych consult to determine patients capacity.   TOC will continue to follow.    Expected Discharge Plan: Skilled Nursing Facility Barriers to Discharge: Continued Medical Work up  Expected Discharge Plan and Services In-house Referral: Clinical Social Work     Living arrangements for the  past 2 months: Homeless                                       Social Determinants of Health (SDOH) Interventions SDOH Screenings   Food Insecurity: Patient Unable To Answer (12/09/2023)  Housing: Patient Unable To Answer (12/09/2023)  Transportation Needs: Patient Unable To Answer (12/09/2023)  Utilities: Patient Unable To Answer (12/09/2023)  Depression (PHQ2-9): Medium Risk (10/16/2020)  Social Connections: Unknown (12/22/2023)  Tobacco Use: High Risk (12/08/2023)    Readmission Risk Interventions    08/27/2023   12:21 PM 08/19/2023    9:17 AM 07/12/2023    9:58 AM  Readmission Risk Prevention Plan  Transportation Screening Complete Complete Complete  Medication Review Oceanographer) Complete Complete Complete  PCP or Specialist appointment within 3-5 days of discharge Complete  --  HRI or Home Care Consult Complete Complete Complete  SW Recovery Care/Counseling Consult Complete Complete Complete  Palliative Care Screening Not Applicable Not Applicable Not Applicable  Skilled Nursing Facility Not Applicable Not Applicable Not Applicable

## 2023-12-31 NOTE — Progress Notes (Signed)
 Progress Note   Patient: Anthony [name]. ZOX:[id number] DOB: 06-16-1956 DOA: 12/07/2023     23 DOS: the patient was seen and examined on 12/31/2023   Brief hospital course: 68 year old man who presented to [facility] ED 5/20 for SOB. PMHx significant for HTN, aortic atherosclerosis, PAF, PE (previously on Eliquis ), EtOH abuse c/b withdrawal/ DTs, tobacco abuse, hiatal hernia (s/p Nissen 2016), chronic low back pain, anxiety who is homeless, recently discharged after opiate withdrawal treatment presented to the ED for evaluation of delirium, hyperglycemia admitted to TRH service for EtOH withdrawal.  While waiting for bed placement patient became intermittently agitated requiring Ativan  per CIWA protocol, transferred to [facility] for intermittent agitation requiring Precedex .  Patient was placed on Precedex , Librium , clonazepam .  Patient is seen by GI, started on stress dose steroids.  Liver, hypoperfusion.  Patient is also seen by advanced heart failure team for biventricular heart failure.  He is transferred to TRH service for further management of hypoglycemia, hyponatremia. His Cortrak removed 6/12, able to eat, sugars better. Sodium stable.  Psych evaluation called for capacity, APS involved for placement.  Assessment and Plan: Alcohol  withdrawal, resolved, Ativan  as needed. Alcoholic cirrhosis - Continue folate, multivitamin, thiamine . Nutritional intake improved. Cortrak removed, awaiting placement.   Hospital delirium- resolved MRI brain nothing acute but advanced atrophy, Neuro thinks hospital delirium superimposed on cognitive impairment. Treated here for possible wernicke. May also have hepatic encephalopathy as below Mental status stable.  Physical therapy evaluated him, able to ambulate. Psychiatry to do capacity evaluation as he is refusing SNF placement. APS involved.   A-fib Rvr this hospitalization, resolved Continue oral amiodarone  Anticoagulation with Eliquis  will be resumed.    HFrEF EF 35-40 on tte. Appears euvolemic today. BP lower side caution with antihypertensive meds, Unable to tolerate GDMT at this time.   History PE Was on apixaban , held due to retroperitoneal bleed. Will resume as Hb stable.   MDD hold brexiprazole   Hyponatremia- Na stable 127-131 Hold brexiprazole, aldactone . Encourage oral fluids, continue salt tabs.   Febrile illness. Resolved. No further work up needed.   Retroperitoneal hematoma Acute blood loss anemia Diagnosed 5/30, moderate in size. Has received total of 5 units last on 5/31, hgb now stable Anticoagulation will be resumed.   Hypoglycemia Sugars better. Encourage oral diet, supplements. Hypoglycemia protocol.  Moderate malnutrition: Eating fair. Cortrak removed 12/30/23. Encourage oral feeds, supplements. Wishes to eat soft moist food, advised RN to change order.  Homelessness- TOC working on safe discharge plan. APS involved. Psych eval for capacity.     Palliative on board for goals of care discussion. Out of bed to chair. Incentive spirometry. Nursing supportive care. Fall, aspiration precautions. Diet:  Diet Orders (From admission, onward)     Start     Ordered   12/29/23 1344  DIET DYS 3 Room service appropriate? Yes; Fluid consistency: Thin  Diet effective now       Comments: Extra gravies and sauces.  Question Answer Comment  Room service appropriate? Yes   Fluid consistency: Thin      12/29/23 1345           DVT prophylaxis:   Level of care: Progressive   Code Status: Limited: Do not attempt resuscitation (DNR) -DNR-LIMITED -Do Not Intubate/DNI   Subjective: Patient is seen and examined today morning. He does not want to go SNF as he does not want to pay. Explained insurance approval pending. He does not seem to have insight. Discussed with TOC, psych consulted for  capacity.   Physical Exam: Vitals:   12/31/23 0344 12/31/23 0744 12/31/23 1047 12/31/23 1100  BP: 99/80 (!)  135/95 127/86   Pulse: (!) 101  67   Resp: 15 18 13  (!) 23  Temp: 98.9 F (37.2 C) 98 F (36.7 C)  98.1 F (36.7 C)  TempSrc: Oral Oral  Oral  SpO2: 99% 99% 96%   Weight: 64.2 kg     Height:        General - Elderly weak Caucasian male, no apparent distress HEENT - PERRLA, EOMI, atraumatic head, cortrak in place Lung - Clear, basal rales, rhonchi, no wheezes. Heart - S1, S2 heard, no murmurs, rubs, trace pedal edema. Abdomen - Soft, non tender, bowel sounds good Neuro - Alert, awake, oriented, non focal exam. Skin - Warm and dry.  Data Reviewed:      Latest Ref Rng & Units 12/31/2023    2:39 AM 12/30/2023    2:51 AM 12/29/2023    2:46 AM  CBC  WBC 4.0 - 10.5 K/uL 4.5  4.1  5.0   Hemoglobin 13.0 - 17.0 g/dL 9.4  9.8  9.9   Hematocrit 39.0 - 52.0 % 29.5  30.0  30.9   Platelets 150 - 400 K/uL 608  613  626       Latest Ref Rng & Units 12/29/2023    2:46 AM 12/28/2023    2:29 AM 12/27/2023    2:56 AM  BMP  Glucose 70 - 99 mg/dL 72  97  161   BUN 8 - 23 mg/dL 17  20  23    Creatinine 0.61 - 1.24 mg/dL 0.96  0.45  4.09   Sodium 135 - 145 mmol/L 127  127  130   Potassium 3.5 - 5.1 mmol/L 4.1  4.2  4.5   Chloride 98 - 111 mmol/L 98  97  102   CO2 22 - 32 mmol/L 22  22  20    Calcium  8.9 - 10.3 mg/dL 8.9  8.9  9.0    No results found.  Disposition: Status is: Inpatient Remains inpatient appropriate because: cortrak removed 6/12. Eating fair. Psych eval for capacity, pending APS, TOC placement.  Planned Discharge Destination: Skilled nursing facility, [facility] signed.     Time spent: 38 minutes  Author: Aisha Hove, MD 12/31/2023 2:20 PM Secure chat 7am to 7pm For on call review [email or web address].

## 2024-01-01 ENCOUNTER — Inpatient Hospital Stay (HOSPITAL_COMMUNITY)

## 2024-01-01 DIAGNOSIS — F10939 Alcohol use, unspecified with withdrawal, unspecified: Secondary | ICD-10-CM

## 2024-01-01 DIAGNOSIS — E162 Hypoglycemia, unspecified: Secondary | ICD-10-CM | POA: Diagnosis not present

## 2024-01-01 LAB — GLUCOSE, CAPILLARY
Glucose-Capillary: 84 mg/dL (ref 70–99)
Glucose-Capillary: 103 mg/dL — ABNORMAL HIGH (ref 70–99)
Glucose-Capillary: 89 mg/dL (ref 70–99)
Glucose-Capillary: 84 mg/dL (ref 70–99)

## 2024-01-01 MED ORDER — METOPROLOL TARTRATE 5 MG/5ML IV SOLN
5.0000 mg | INTRAVENOUS | Status: DC | PRN
  Administered 2024-01-02: 5 mg via INTRAVENOUS
  Filled 2024-01-01: qty 5

## 2024-01-01 MED ORDER — FENTANYL CITRATE PF 50 MCG/ML IJ SOSY
25.0000 ug | PREFILLED_SYRINGE | INTRAMUSCULAR | Status: DC | PRN
  Administered 2024-01-02 – 2024-01-04 (×3): 25 ug via INTRAVENOUS
  Filled 2024-01-01 (×3): qty 1

## 2024-01-01 NOTE — Plan of Care (Signed)
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
## 2024-01-01 NOTE — Progress Notes (Signed)
 PROGRESS NOTE  Anthony A Maffett Jr.  DOB: Dec 06, 1955  PCP: Pcp, No WUJ:[id number]  DOA: 12/07/2023  LOS: 24 days  Hospital Day: 26  Brief narrative: Anthony A Madrazo [name] is a 68 y.o. male with PMH significant for homelessness, chronic alcoholism c/b withdrawals/DTs, chronic smoking, HTN, paroxysmal A-fib, h/o PE previously on Eliquis , hiatal hernia s/p Nissen fundoplication 2016, chronic low back pain, chronic anxiety. Recently hospitalized for opiate withdrawal treatment 5/20, patient presents to the ED for confusion, low blood sugar level  Admitted to [facility]  He has had prolonged hospitalization and is currently difficult to place list. During this hospitalization, he has also been seen by critical care, CHF team, GI, neurology, psychiatry and palliative care.    While waiting for bed placement patient became intermittently agitated requiring Ativan  per CIWA protocol, transferred to [facility] for intermittent agitation requiring Precedex .  Patient was placed on Precedex , Librium , clonazepam .  Patient is seen by GI, started on stress dose steroids.  Liver, hypoperfusion.  Patient is also seen by advanced heart failure team for biventricular heart failure.  He is transferred to [facility] service for further management of hypoglycemia, hyponatremia. His Cortrak removed 6/12, able to eat, sugars better. Sodium stable.  Psych evaluation called for capacity, APS involved for placement.  Subjective: Patient was seen and examined this morning.  Elderly Caucasian male.  Lying on bed.  Not in distress.  Responds to his name.  Not present conversationally words.  Assessment and plan: Alcohol  withdrawal, resolved, Ativan  as needed. Alcoholic cirrhosis - Continue folate, multivitamin, thiamine . Nutritional intake improved. Cortrak removed, awaiting placement.   Hospital delirium- resolved MRI brain nothing acute but advanced atrophy, Neuro thinks hospital delirium superimposed on cognitive impairment. Treated here  for possible wernicke. May also have hepatic encephalopathy as below Mental status stable.  Physical therapy evaluated him, able to ambulate. Psychiatry to do capacity evaluation as he is refusing SNF placement. APS involved.   A-fib Rvr this hospitalization, resolved Continue oral amiodarone  Anticoagulation with Eliquis  will be resumed.   HFrEF EF 35-40 on tte. Appears euvolemic today. BP lower side caution with antihypertensive meds, Unable to tolerate GDMT at this time.   History PE Was on apixaban , held due to retroperitoneal bleed. Will resume as Hb stable.   MDD hold brexiprazole   Hyponatremia- Na stable 127-131 Hold brexiprazole, aldactone . Encourage oral fluids, continue salt tabs.   Febrile illness. Resolved. No further work up needed.   Retroperitoneal hematoma Acute blood loss anemia Diagnosed 5/30, moderate in size. Has received total of 5 units last on 5/31, hgb now stable Anticoagulation will be resumed.   Hypoglycemia Sugars better. Encourage oral diet, supplements. Hypoglycemia protocol.   Moderate malnutrition: Eating fair. Cortrak removed 12/30/23. Encourage oral feeds, supplements. Wishes to eat soft moist food, advised RN to change order.   Homelessness- TOC working on safe discharge plan. APS involved. Psych eval for capacity.     Palliative on board for goals of care discussion. Out of bed to chair. Incentive spirometry. Nursing supportive care. Fall, aspiration precautions.  Goals of care   Code Status: Limited: Do not attempt resuscitation (DNR) -DNR-LIMITED -Do Not Intubate/DNI      DVT prophylaxis:   apixaban  (ELIQUIS ) tablet 5 mg   Antimicrobials: None Fluid: None Consultants: Psychiatry, palliative care Family Communication: None at bedside  Status: Inpatient Level of care:  Telemetry Medical   Patient is from: Homeless Needs to continue in-hospital care: Disposition challenge Anticipated d/c to: Unclear at this  time  Diet:  Diet Order             DIET DYS 3 Room service appropriate? Yes; Fluid consistency: Thin  Diet effective now                   Scheduled Meds:  (feeding supplement) PROSource Plus  30 mL Oral BID BM   sodium chloride    Intravenous Once   amiodarone   200 mg Oral BID   apixaban   5 mg Oral BID   feeding supplement  237 mL Oral TID BM   folic acid   1 mg Oral Daily   Gerhardt's butt cream   Topical BID   lidocaine   1 patch Transdermal Q24H   loratadine   10 mg Oral Daily   magnesium  oxide  400 mg Oral Daily   mouth rinse  15 mL Mouth Rinse 4 times per day   pantoprazole   40 mg Oral Daily   sodium chloride  flush  10-40 mL Intracatheter Q12H   sodium chloride   1 g Oral TID WC   thiamine   100 mg Oral Daily    PRN meds: acetaminophen , OLANZapine  zydis, sodium chloride  flush   Infusions:    Antimicrobials: Anti-infectives (From admission, onward)    Start     Dose/Rate Route Frequency Ordered Stop   12/09/23 1100  metroNIDAZOLE  (FLAGYL ) IVPB 500 mg  Status:  Discontinued        500 mg 100 mL/hr over 60 Minutes Intravenous Every 12 hours 12/09/23 1004 12/14/23 1044   12/09/23 1000  cefTRIAXone  (ROCEPHIN ) 2 g in sodium chloride  0.9 % 100 mL IVPB  Status:  Discontinued        2 g 200 mL/hr over 30 Minutes Intravenous Every 24 hours 12/09/23 0908 12/14/23 1044       Objective: Vitals:   01/01/24 1151 01/01/24 1612  BP: 103/71 115/85  Pulse:  97  Resp: 18 18  Temp: 98.3 F (36.8 C) 98.1 F (36.7 C)  SpO2: 99% 100%    Intake/Output Summary (Last 24 hours) at 01/01/2024 1622 Last data filed at 12/31/2023 2008 Gross per 24 hour  Intake --  Output 200 ml  Net -200 ml   Filed Weights   12/29/23 0329 12/31/23 0344 01/01/24 0542  Weight: 63.2 kg 64.2 kg 62.6 kg   Weight change: -1.6 kg Body mass index is 22.28 kg/m.   Physical Exam: General exam: Pleasant, lying in bed.  Not in distress Skin: No rashes, lesions or ulcers. HEENT:  Atraumatic, normocephalic, no obvious bleeding Lungs: Clear to auscultation bilaterally,  CVS: S1, S2, no murmur,   GI/Abd: Soft, nontender, nondistended, bowel sound present,   CNS: Alert, awake Psychiatry: Mood appropriate,  Extremities: No pedal edema, no calf tenderness,   Data Review: I have personally reviewed the laboratory data and studies available.  F/u labs ordered Unresulted Labs (From admission, onward)    None       Signed, Hoyt Macleod, MD [facility] 01/01/2024

## 2024-01-01 NOTE — Progress Notes (Incomplete)
Report called to Brianna RN

## 2024-01-02 DIAGNOSIS — E162 Hypoglycemia, unspecified: Secondary | ICD-10-CM | POA: Diagnosis not present

## 2024-01-02 DIAGNOSIS — F1010 Alcohol abuse, uncomplicated: Secondary | ICD-10-CM | POA: Diagnosis not present

## 2024-01-02 DIAGNOSIS — Z66 Do not resuscitate: Secondary | ICD-10-CM | POA: Diagnosis not present

## 2024-01-02 DIAGNOSIS — Z515 Encounter for palliative care: Secondary | ICD-10-CM | POA: Diagnosis not present

## 2024-01-02 DIAGNOSIS — I4891 Unspecified atrial fibrillation: Secondary | ICD-10-CM | POA: Diagnosis present

## 2024-01-02 LAB — CBC WITH DIFFERENTIAL/PLATELET
Abs Immature Granulocytes: 0.03 10*3/uL (ref 0.00–0.07)
Basophils Absolute: 0.1 10*3/uL (ref 0.0–0.1)
Basophils Relative: 1 %
Eosinophils Absolute: 0.2 10*3/uL (ref 0.0–0.5)
Eosinophils Relative: 5 %
HCT: 32.3 % — ABNORMAL LOW (ref 39.0–52.0)
Hemoglobin: 10.5 g/dL — ABNORMAL LOW (ref 13.0–17.0)
Immature Granulocytes: 1 %
Lymphocytes Relative: 31 %
Lymphs Abs: 1.6 10*3/uL (ref 0.7–4.0)
MCH: 30.6 pg (ref 26.0–34.0)
MCHC: 32.5 g/dL (ref 30.0–36.0)
MCV: 94.2 fL (ref 80.0–100.0)
Monocytes Absolute: 0.8 10*3/uL (ref 0.1–1.0)
Monocytes Relative: 15 %
Neutro Abs: 2.5 10*3/uL (ref 1.7–7.7)
Neutrophils Relative %: 47 %
Platelets: 547 10*3/uL — ABNORMAL HIGH (ref 150–400)
RBC: 3.43 MIL/uL — ABNORMAL LOW (ref 4.22–5.81)
RDW: 19.1 % — ABNORMAL HIGH (ref 11.5–15.5)
WBC: 5.2 10*3/uL (ref 4.0–10.5)
nRBC: 0 % (ref 0.0–0.2)

## 2024-01-02 LAB — BASIC METABOLIC PANEL WITH GFR
Anion gap: 11 (ref 5–15)
BUN: 15 mg/dL (ref 8–23)
CO2: 20 mmol/L — ABNORMAL LOW (ref 22–32)
Calcium: 9.2 mg/dL (ref 8.9–10.3)
Chloride: 98 mmol/L (ref 98–111)
Creatinine, Ser: 0.53 mg/dL — ABNORMAL LOW (ref 0.61–1.24)
GFR, Estimated: >60 mL/min (ref >60)
Glucose, Bld: 88 mg/dL (ref 70–99)
Potassium: 4.2 mmol/L (ref 3.5–5.1)
Sodium: 129 mmol/L — ABNORMAL LOW (ref 135–145)

## 2024-01-02 LAB — URINALYSIS, COMPLETE (UACMP) WITH MICROSCOPIC
Bacteria, UA: NONE SEEN
Bilirubin Urine: NEGATIVE
Glucose, UA: NEGATIVE mg/dL
Hgb urine dipstick: NEGATIVE
Ketones, ur: NEGATIVE mg/dL
Leukocytes,Ua: NEGATIVE
Nitrite: NEGATIVE
Protein, ur: NEGATIVE mg/dL
Specific Gravity, Urine: 1.017 (ref 1.005–1.030)
pH: 7 (ref 5.0–8.0)

## 2024-01-02 LAB — BRAIN NATRIURETIC PEPTIDE: B Natriuretic Peptide: 265.6 pg/mL — ABNORMAL HIGH (ref 0.0–100.0)

## 2024-01-02 LAB — MAGNESIUM
Magnesium: 1.3 mg/dL — ABNORMAL LOW (ref 1.7–2.4)
Magnesium: 1.5 mg/dL — ABNORMAL LOW (ref 1.7–2.4)

## 2024-01-02 LAB — GLUCOSE, CAPILLARY
Glucose-Capillary: 98 mg/dL (ref 70–99)
Glucose-Capillary: 118 mg/dL — ABNORMAL HIGH (ref 70–99)

## 2024-01-02 LAB — LACTIC ACID, PLASMA
Lactic Acid, Venous: 2.2 mmol/L (ref 0.5–1.9)
Lactic Acid, Venous: 0.6 mmol/L (ref 0.5–1.9)
Lactic Acid, Venous: 1.4 mmol/L (ref 0.5–1.9)

## 2024-01-02 LAB — PROCALCITONIN: Procalcitonin: <0.1 ng/mL

## 2024-01-02 MED ORDER — MAGNESIUM SULFATE 2 GM/50ML IV SOLN
2.0000 g | Freq: Once | INTRAVENOUS | Status: AC
  Administered 2024-01-02: 2 g via INTRAVENOUS
  Filled 2024-01-02: qty 50

## 2024-01-02 MED ORDER — LACTATED RINGERS IV BOLUS
500.0000 mL | Freq: Once | INTRAVENOUS | Status: AC
  Administered 2024-01-02: 500 mL via INTRAVENOUS

## 2024-01-02 MED ORDER — METOPROLOL TARTRATE 12.5 MG HALF TABLET
12.5000 mg | ORAL_TABLET | Freq: Once | ORAL | Status: DC
  Filled 2024-01-02: qty 1

## 2024-01-02 MED ORDER — VANCOMYCIN HCL 1500 MG/300ML IV SOLN
1500.0000 mg | Freq: Once | INTRAVENOUS | Status: AC
  Administered 2024-01-02: 1500 mg via INTRAVENOUS
  Filled 2024-01-02: qty 300

## 2024-01-02 MED ORDER — SODIUM CHLORIDE 0.9 % IV SOLN: Freq: Once | INTRAVENOUS | Status: AC

## 2024-01-02 MED ORDER — VANCOMYCIN HCL 750 MG/150ML IV SOLN
750.0000 mg | Freq: Two times a day (BID) | INTRAVENOUS | Status: DC
  Administered 2024-01-03 (×3): 750 mg via INTRAVENOUS
  Filled 2024-01-02 (×4): qty 150

## 2024-01-02 MED ORDER — PIPERACILLIN-TAZOBACTAM 3.375 G IVPB
3.3750 g | Freq: Three times a day (TID) | INTRAVENOUS | Status: DC
  Administered 2024-01-02 – 2024-01-06 (×13): 3.375 g via INTRAVENOUS
  Filled 2024-01-02 (×11): qty 50

## 2024-01-02 MED ORDER — DILTIAZEM HCL-DEXTROSE 125-5 MG/125ML-% IV SOLN (PREMIX)
5.0000 mg/h | INTRAVENOUS | Status: DC
  Administered 2024-01-02: 5 mg/h via INTRAVENOUS
  Filled 2024-01-02: qty 125

## 2024-01-02 MED ORDER — DILTIAZEM LOAD VIA INFUSION
10.0000 mg | Freq: Once | INTRAVENOUS | Status: AC
  Administered 2024-01-02: 10 mg via INTRAVENOUS
  Filled 2024-01-02: qty 10

## 2024-01-02 MED ORDER — SODIUM CHLORIDE 0.9 % IV SOLN
INTRAVENOUS | Status: DC
  Administered 2024-01-03: 100 mL via INTRAVENOUS

## 2024-01-02 MED ORDER — ONDANSETRON HCL 4 MG/2ML IJ SOLN
4.0000 mg | Freq: Four times a day (QID) | INTRAMUSCULAR | Status: DC | PRN
  Administered 2024-01-02 (×2): 4 mg via INTRAVENOUS
  Filled 2024-01-02 (×2): qty 2

## 2024-01-02 NOTE — Progress Notes (Signed)
   01/02/24 0605  Assess: MEWS Score  Temp (!) 101.8 F (38.8 C)  BP (!) 124/96  MAP (mmHg) 105  Pulse Rate (!) 121  ECG Heart Rate (!) 118  Resp 20  Level of Consciousness Alert  SpO2 96 %  O2 Device Room Air  Patient Activity (if Appropriate) In bed  Assess: MEWS Score  MEWS Temp 2  MEWS Systolic 0  MEWS Pulse 2  MEWS RR 0  MEWS LOC 0  MEWS Score 4  MEWS Score Color Red  Assess: if the MEWS score is Yellow or Red  Were vital signs accurate and taken at a resting state? Yes  Does the patient meet 2 or more of the SIRS criteria? Yes  Does the patient have a confirmed or suspected source of infection? Yes  MEWS guidelines implemented  Yes, red  Treat  MEWS Interventions Considered administering scheduled or prn medications/treatments as ordered  Take Vital Signs  Increase Vital Sign Frequency  Red: Q1hr x2, continue Q4hrs until patient remains green for 12hrs  Escalate  MEWS: Escalate Red: Discuss with charge nurse and notify provider. Consider notifying RRT. If remains red for 2 hours consider need for higher level of care  Notify: Charge Nurse/RN  Name of Charge Nurse/RN Administrator, arts, RN  Assess: SIRS CRITERIA  SIRS Temperature  1  SIRS Respirations  0  SIRS Pulse 1  SIRS WBC 0  SIRS Score Sum  2

## 2024-01-02 NOTE — Progress Notes (Signed)
 PROGRESS NOTE  Anthony A Deridder Jr.  DOB: February 04, 1956  PCP: Pcp, No ZOX:[id number]  DOA: 12/07/2023  LOS: 25 days  Hospital Day: 27  Brief narrative: Anthony [name]. is a 68 y.o. male with PMH significant for homelessness, chronic alcoholism c/b withdrawals/DTs, chronic smoking, HTN, paroxysmal A-fib, h/o PE previously on Eliquis , hiatal hernia s/p Nissen fundoplication 2016, chronic low back pain, chronic anxiety. Recently hospitalized for opiate withdrawal treatment 5/20, patient presents to the ED for confusion, low blood sugar level  Admitted to [facility]  He has had prolonged hospitalization and is currently difficult to place list. During this hospitalization, he has also been seen by critical care, CHF team, GI, neurology, psychiatry and palliative care.   While waiting for bed placement patient became intermittently agitated requiring Ativan  per CIWA protocol, transferred to [facility] for intermittent agitation requiring Precedex .  Patient was placed on Precedex , Librium , clonazepam .   Patient was seen by GI, started on stress dose steroids.  Patient was also seen by advanced heart failure team for biventricular heart failure.   He is transferred to [facility] service for further management of hypoglycemia, hyponatremia.  6/12, his Cortrak removed.  Able to eat, sugars better. Sodium stable.  Per psychiatry consultation, patient lacks decision-making capacity, APS involved for placement.  Subjective: Patient was seen and examined this morning.  Elderly Caucasian male.  Lying down on bed.  Not in distress.   Events from last night noted. Patient had multiple occurrence of fever up to 2.2 with blood pressure drop to 90s. Blood pressure further dropped to 80s earlier this afternoon.  Assessment and plan: # SIRS with hypotension Had episodes of fever last night up to 102.2 with drop in blood pressure as low as 80s this morning WC count normal, procalcitonin level not elevated.  Lactic acid level  elevated 2.2 likely. Blood culture sent.  Urinalysis normal Started on empiric IV Zosyn  and IV vancomycin  On exam this morning, does not look toxic. Was given IV fluid bolus earlier for low blood pressure And IV hydration overnight.  Continue to monitor for any fever. Recent Labs  Lab 12/29/23 0246 12/30/23 0251 12/31/23 0239 01/02/24 0008 01/02/24 1227  WBC 5.0 4.1 4.5 5.2  --   LATICACIDVEN  --   --   --   --  2.2*  PROCALCITON  --   --   --  <0.10  --    Alcoholic liver cirrhosis Alcohol  withdrawal symptoms Withdrawal symptoms resolved in the hospital with CIWA protocol and as needed Ativan  Continue folate, multivitamin, thiamine .   Hospital delirium- resolved MRI brain nothing acute but advanced atrophy, Neuro thinks hospital delirium superimposed on cognitive impairment. Treated here for possible wernicke. May also have hepatic encephalopathy as below. Mental status stable.  Physical therapy evaluated him, able to ambulate. Psychiatry to do capacity evaluation as he is refusing SNF placement. APS involved.    Acute blood loss anemia  Retroperitoneal hematoma Chronic iron  deficiency anemia Hemoglobin dropped to the lowest of 6 on 5/29.  Diagnosed to have retroperitoneal hematoma.  Received total of 5 units of PRBC Ferritin level low at 9 in May 2025.  Iron  deficiency is likely to chronic blood loss related to liver cirrhosis Eliquis  has subsequently been resumed. No active bleeding right now.  Continue to monitor hemoglobin Recent Labs    02/04/23 0051 02/04/23 0226 02/04/23 0300 02/04/23 8119 02/05/23 0744 07/10/23 0250 07/11/23 0730 07/29/23 1478 07/30/23 0403 08/02/23 2956 08/03/23 2130 12/08/23 8657 12/08/23 8469 12/08/23 6295 12/08/23  1718 12/25/23 0111 12/29/23 0246 12/30/23 0251 12/31/23 0239 01/02/24 0008  HGB 9.6*   < >  --   --    < > 10.4*   < >  --    < > 9.5*   < >  --   --   --    < > 9.7* 9.9* 9.8* 9.4* 10.5*  MCV 101.8*  --   --   --    < >  98.8   < >  --    < > 99.3   < >  --   --   --    < > 91.1 94.5 92.9 94.2 94.2  VITAMINB12  --   --   --  1,497*  --  620  --  303  --  473  --  440  --   --   --   --   --   --   --   --   FOLATE  --   --  12.2  --   --  17.9  --   --   --  15.0  --   --  12.1  --   --   --   --   --   --   --   FERRITIN 74  --   --   --   --  18*  --   --   --  128  --   --  9*  --   --   --   --   --   --   --   TIBC 353  --   --   --   --  413  --   --   --  335  --   --  308  --   --   --   --   --   --   --   IRON  21*  --   --   --   --  23*  --   --   --  17*  --   --  42*  --   --   --   --   --   --   --   RETICCTPCT 3.3*  --   --   --   --   --   --   --   --  1.3  --   --   --  1.1  --   --   --   --   --   --    < > = values in this interval not displayed.   A-fib Rvr this hospitalization, resolved Continue oral amiodarone  Eliquis  plan as above   HFrEF EF 30 to 40%  Blood pressure running low.  Not on any antihypertensives or GDMT H/o PE Eliquis  plan as above   MDD hold brexiprazole   Hyponatremia Na stable 127-131 Hold brexiprazole, aldactone . Encourage oral fluids, continue salt tabs.    Moderate malnutrition: Eating fair. Cortrak removed 12/30/23. Encourage oral feeds, supplements. Wishes to eat soft moist food, advised RN to change order.   Homelessness TOC working on safe discharge plan. APS involved.  6/15, per psychiatry, patient lacks capacity for decision-making   Palliative on board for goals of care discussion. Case management involved for placement  Goals of care   Code Status: Limited: Do not attempt resuscitation (DNR) -DNR-LIMITED -Do Not Intubate/DNI      DVT prophylaxis:   apixaban  (ELIQUIS ) tablet 5  mg   Antimicrobials: None Fluid: None Consultants: Psychiatry, palliative care Family Communication: None at bedside  Status: Inpatient Level of care:  Progressive   Patient is from: Homeless Needs to continue in-hospital care: Disposition  challenge Anticipated d/c to: Unclear at this time      Diet:  Diet Order             DIET DYS 3 Room service appropriate? Yes; Fluid consistency: Thin  Diet effective now                   Scheduled Meds:  (feeding supplement) PROSource Plus  30 mL Oral BID BM   sodium chloride    Intravenous Once   amiodarone   200 mg Oral BID   apixaban   5 mg Oral BID   feeding supplement  237 mL Oral TID BM   folic acid   1 mg Oral Daily   Gerhardt's butt cream   Topical BID   lidocaine   1 patch Transdermal Q24H   loratadine   10 mg Oral Daily   magnesium  oxide  400 mg Oral Daily   metoprolol  tartrate  12.5 mg Oral Once   mouth rinse  15 mL Mouth Rinse 4 times per day   pantoprazole   40 mg Oral Daily   sodium chloride  flush  10-40 mL Intracatheter Q12H   sodium chloride   1 g Oral TID WC   thiamine   100 mg Oral Daily    PRN meds: acetaminophen , fentaNYL  (SUBLIMAZE ) injection, metoprolol  tartrate, OLANZapine  zydis, ondansetron  (ZOFRAN ) IV, sodium chloride  flush   Infusions:   sodium chloride      diltiazem  (CARDIZEM ) infusion 5 mg/hr (01/02/24 1412)   piperacillin -tazobactam (ZOSYN )  IV 3.375 g (01/02/24 1414)   [START ON 01/03/2024] vancomycin       Antimicrobials: Anti-infectives (From admission, onward)    Start     Dose/Rate Route Frequency Ordered Stop   01/03/24 0000  vancomycin  (VANCOREADY) IVPB 750 mg/150 mL        750 mg 150 mL/hr over 60 Minutes Intravenous Every 12 hours 01/02/24 1001     01/02/24 1100  vancomycin  (VANCOREADY) IVPB 1500 mg/300 mL        1,500 mg 150 mL/hr over 120 Minutes Intravenous  Once 01/02/24 1000 01/02/24 1325   01/02/24 0400  piperacillin -tazobactam (ZOSYN ) IVPB 3.375 g        3.375 g 12.5 mL/hr over 240 Minutes Intravenous Every 8 hours 01/02/24 0231     12/09/23 1100  metroNIDAZOLE  (FLAGYL ) IVPB 500 mg  Status:  Discontinued        500 mg 100 mL/hr over 60 Minutes Intravenous Every 12 hours 12/09/23 1004 12/14/23 1044   12/09/23 1000   cefTRIAXone  (ROCEPHIN ) 2 g in sodium chloride  0.9 % 100 mL IVPB  Status:  Discontinued        2 g 200 mL/hr over 30 Minutes Intravenous Every 24 hours 12/09/23 0908 12/14/23 1044       Objective: Vitals:   01/02/24 1326 01/02/24 1356  BP: 93/67 95/67  Pulse:    Resp:    Temp:    SpO2:      Intake/Output Summary (Last 24 hours) at 01/02/2024 1419 Last data filed at 01/02/2024 1412 Gross per 24 hour  Intake 580.56 ml  Output 350 ml  Net 230.56 ml   Filed Weights   12/31/23 0344 01/01/24 0542 01/02/24 0640  Weight: 64.2 kg 62.6 kg 63 kg   Weight change: 0.4 kg Body mass index is 22.42 kg/m.  Physical Exam: General exam: Pleasant, lying in bed.  Not in distress. Skin: No rashes, lesions or ulcers. HEENT: Atraumatic, normocephalic, no obvious bleeding Lungs: Clear to auscultation bilaterally,  CVS: S1, S2, no murmur,   GI/Abd: Soft, nontender, nondistended, bowel sound present,   CNS: Alert, awake Psychiatry: Mood appropriate,  Extremities: No pedal edema, no calf tenderness,   Data Review: I have personally reviewed the laboratory data and studies available.  F/u labs ordered Unresulted Labs (From admission, onward)     Start     Ordered   01/02/24 1800  Lactic acid, plasma  Once-Timed,   TIMED       Question:  Specimen collection method  Answer:  Lab=Lab collect   01/02/24 1409   01/02/24 1534  Lactic acid, plasma  Once,   STAT        01/02/24 1534   01/02/24 0645  Culture, blood (Routine X 2) w Reflex to ID Panel  BLOOD CULTURE X 2,   R     Comments: STAT collection. Please collect blood cultures x 2 prior to administration of antibiotics.    01/02/24 9629            Signed, Hoyt Macleod, MD [facility] 01/02/2024

## 2024-01-02 NOTE — Progress Notes (Addendum)
 [facility] night cross cover note:   I was notified by the patient's RN that the patient is complaining of new right-sided chest wall discomfort, in the absence of any associated substernal or left-sided chest pain.  Patient without additional reported complaint at this time, including no report of shortness of breath.  Vital signs at this time are also notable for mildly elevated temperature of 100.4 (38 C), which does not meet quantitative threshold for inclusion in SIRS criteria. In the setting of a history of A fib, the patient is noted to remain in atrial fibrillation, similar to day shift. most recent heart rates in the 1 teens to 120s, up slightly from heart rates in the low 100's to 110's during dayshift.  Blood pressure remained stable, with most recent systolic blood pressures noted to be in the 140s mmHg; RR 17; O2 saturations in the high 90s to 100% on room air.  Pt received prn acetaminophen  for his mildly elevated temperature as well as the right-sided chest wall discomfort, following which he notes some residual right-sided chest wall discomfort, for which I have added as needed IV fentanyl .  He is known to have an acute pulmonary embolism, for which Eliquis  was recently resumed, after a period of time in which anticoagulation was held due to retroperitoneal bleed.  Differential for the patient's mildly elevated temperature includes the presence of this acute pulmonary embolism.  However, in the setting of new right-sided chest wall discomfort, mildly elevated temperature, relative increase in heart rate, will also check chest x-ray at this time.  I have also ordered updated urinalysis as well as procalcitonin level.   While the patient's relative increase in heart rate may well be on the basis of new right-sided chest wall discomfort, will also check updated EKG at this time as well as BMP, magnesium  level, and BNP.  Additionally, I have added as needed IV Lopressor  for sustained heart rates in  the 130s.  In the context of his history of retroperitoneal bleed with recent resumption of Eliquis , I have also ordered CBC.    Update: EKG shows atrial fibrillation with heart rate 121, no evidence of T wave changes, nonspecific less than 1 mm ST depression in V6, while otherwise demonstrating no evidence of ST changes, including no evidence of STEMI.    Additional Updates: Lab results were notable for the following: Magnesium  found to be low at 1.3, for which I have ordered magnesium  sulfate 2 g IV over 2 hours, and added repeat magnesium  level to be checked in the morning.  BMP shows improving sodium level, now 129, potassium 4.2, along with stable creatinine.  In the context of history of retroperitoneal bleed with recent resumption of Eliquis , CBC shows stable hemoglobin, now at 10.5 compared to most recent prior value of 9.4.  Blood blood cell count 5.2 compared to 4.52 days ago.  BNP now 265, down from prior value of 794 three weeks ago.  CXR showed new multifocal airspace opacities, R > L, suggestive of multifocal pneumonia, which may be contributing to the elevated temp, the elevated HR, and the right-sided chest discomfort.  Chest x-ray otherwise shows no evidence of acute cardiopulmonary process, including no evidence of edema or pneumothorax.  Small chronic appearing right-sided pleural effusion is also noted. Interval decline in BNP also speaks against acutely decompensated heart failure.   It is noted that procalcitonin level remains pending at this time.  However, given concern for potential interval development of pna, with increased risk for resistant  organisms, I have started Zosyn .   As noted above, the absence of temp greater than 38.3 and in the absence of leukocytosis, SIRS criteria for sepsis are not currently met.    Update:  Patient continues to exhibit atrial fibrillation with RVR, with sustained heart rates in the 130s to 140s in spite of the above measures.  Blood  pressure remains stable, with most recent systolic blood pressures in the 140s mmHg. Will start diltiazem  drip at this time with preceding iv diltiazem  bolus, which continuing to treat underlying causes, as outpatient above.   Additionally, as the patient is currently med-tele status, I will transfer pt to progressive unit. In order to achieve transfer to progressive unit, I have placed the following 3 orders:     1) order to transfer patient to progressive unit 2) order to change level of care to progressive 3) a new admission order to admit to progressive   I have communicated the above plan to transfer patient to progressive unit with the patient's RN.     Update: 640 AM: Patient has transferred to pcu and has started on dilt drip after 10 mg iv dilt bolus. HR improving, now 110's to 120 on dilt drip at 5 mg/hr, relative to prior sustained HR's in the 130-140's. BP has become a [place] soft, now 91/69. Patient conveys some lightheadedness, without additional acute symptoms at this time, including no chest pain.   Most recent temp 102.1 in setting of suspected dx of pna overnight.   Given current BP, I've asked pt's RN to not go up on rate of dilt drip further at this time. Rather, I have ordered a 500 cc LR bolus.   In setting of objective fever and tachycardia, criteria are now met for sepsis in the setting of suspected multi focal pneumonia.  Will check stat blood cultures x 2 at this time prior to the any additional doses of Zosyn .  Will also check stat lactic acid level every 3 hours x 2 occurrences. In absence of hypotension refractory to IV fluids in the absence of lactic acid level greater than 4.0, criteria are not met for provision of 30 ml/kg ivf bolus at this time.  Patient with order for prn acetaminophen  for fever.     Camelia Cavalier, DO Hospitalist

## 2024-01-02 NOTE — Progress Notes (Signed)
 Patient is on a cardizem  gtt at 5 mg. Bp was 96/57 (70) at 1930. Cardizem   held. his bp is now 106/65. Cardizem  gtt restarted. Patient is still in Afib. HR  is currently in the low 100s. Kakrandy notified. Advised to turn off Cardizem  until SBP reaches 110 or above then resume cardizem .

## 2024-01-02 NOTE — Progress Notes (Signed)
 Pharmacy Antibiotic Note  Anthony [name] Anthony [name]. is a 68 y.o. male admitted on 12/07/2023 with sepsis.  Pharmacy has been consulted for vancomycin  dosing.  Code sepsis called after presence of fever and tachycardia. Tmax 102.2, HR: 112, WBC wnl, Scr 0.53, LA pending, Bcx pending. Notably patient was started on Zosyn   and completed a course of Rocephin  and metronidazole  earlier in this hospitalization.   Plan: Vancomycin  1500mg  IV x 1  Then, start 750 mg IV vancomycin  every 12 hours (eAUC 474, Scr rounded to 0.8, TBW, Vd 0.72)  Monitor renal function, cultures, and overall clinical pictures Levels as indicated De-escalate as able   Height: 5' 6 (167.6 cm) Weight: 63 kg (138 lb 14.2 oz) IBW/kg (Calculated) : 63.8  Temp (24hrs), Avg:100.4 F (38 C), Min:98.1 F (36.7 C), [name]:102.2 F (39 C)  Recent Labs  Lab 12/27/23 0256 12/28/23 0229 12/29/23 0246 12/30/23 0251 12/31/23 0239 01/02/24 0008  WBC  --   --  5.0 4.1 4.5 5.2  CREATININE 0.54* 0.47* 0.54*  --   --  0.53*    Estimated Creatinine Clearance: 78.8 mL/min (A) (by C-G formula based on SCr of 0.53 mg/dL (L)).    Allergies  Allergen Reactions   Other Itching and Other (See Comments)    Seasonal allergies- Itchy eyes, runny nose, congestion   Poison Ivy Extract Rash    Antimicrobials this admission: Ceftriaxone  5/22>> 5/27 Metronidazole  5/22>> 5/27 Zosyn  6/15 >>  Vancomycin  6/15 >>    Microbiology results: 5/22 BCx: corynebacterium 1/4 6/15 Bcx:   Thank you for allowing pharmacy to be a part of this patient's care.  Clayborn Cunas 01/02/2024 9:51 AM

## 2024-01-02 NOTE — Plan of Care (Signed)
   Problem: Activity: Goal: Risk for activity intolerance will decrease Outcome: Progressing   Problem: Nutrition: Goal: Adequate nutrition will be maintained Outcome: Progressing   Problem: Pain Managment: Goal: General experience of comfort will improve and/or be controlled Outcome: Progressing

## 2024-01-02 NOTE — Progress Notes (Signed)
 Patient ID: Anthony Hora., male   DOB: 07-Aug-1955, 68 y.o.   MRN: [id number]    Progress Note from the Palliative Medicine Team at [facility]   Patient Name: Anthony A Lada Jr.        Date: 01/02/2024 DOB: May 09, 1956  Age: 68 y.o. MRN#: [id number] Attending Physician: Hoyt Macleod, MD Primary Care Physician: Pcp, No Admit Date: 12/07/2023   Reason for Consultation/Follow-up   Establishing Goals of Care   HPI/ Brief Hospital Review  68 year old man who presented to [facility] ED 5/20 for SOB. PMHx significant for HTN, aortic atherosclerosis, PAF, PE (previously on Eliquis ), EtOH abuse c/b withdrawal/DTs, tobacco abuse, hiatal hernia (s/p Nissen 2016), chronic low back pain, anxiety who is homeless, recently discharged after opiate withdrawal treatment presented to the ED for evaluation of delirium, hyperglycemia admitted to [facility] service for EtOH withdrawal.   Patient does not have medical decision-making capacity at this time.    Family face treatment option decision, advanced directive decisions and anticipatory care needs.   Subjective  Extensive chart review has been completed prior to meeting with patient/family  including labs, vital signs, imaging, progress/consult notes, orders, medications and available advance directive documents.    This NP assessed patient at the bedside as a follow up for palliative medicine needs and emotional support.  Patient is alert, intermittently confused.   I spoke to his brother Anthony [name] by telephone, Anthony [name] lives in [place] .  He understands the complexity of patient's psychosocial situation.  Patient has been intermittently homeless over the past many years.  He has lived in boardinghouse situations, and rental rooms.  Many time he has been asked to leave for outbursts.  He speaks to how his life has been severely impacted by his alcoholism.    Patient's brother tells me that the worst possible thing for patient would be need for long-term care  placement, or any kind of confined space  Education offered to his brother regarding psychiatry consult finding the patient to lack capacity to make an informed decision about his discharge planning.  Explored with patient's brother the option of securing guardianship, or willingness to make decisions for his brother at this point in time.  Anthony [name] tells me that he will lean heavily on provider recommendations.  Education offered on the difference between a full medical support path attempting to prolong life versus a palliative comfort path foregoing life-prolonging measures allowing for natural death for this patient, at this time in this situation.  Education offered on details of MOST form and its completion   Education offered on hospice benefit; philosophy and eligibility  Education offered on the importance of consideration for holistic approach to care.   Patient has expressed his desire to never live in a confined environment specifically a skilled nursing facility.    If patient stabilizes he will likely need long-term placement, consideration for existential pain and suffering such patient needs to be considered   At this time continue to treat the treatable and hope improvement, as family process situation and make important advanced directive decisions.  Education offered today regarding  the importance of continued conversation with family and their  medical providers regarding overall plan of care and treatment options,  ensuring decisions are within the context of the patients values and GOCs.  Questions and concerns addressed   Discussed with primary team and nursing staff    Discussed with Dr Gwynneth Lessen   PMT will continue to support holistically   Time: 50  minutes  Detailed review of medical records ( labs, imaging, vital signs), medically appropriate exam ( MS, skin, cardiac,  resp)   discussed with treatment team, counseling and education to patient, family, staff,  documenting clinical information, medication management, coordination of care    Anthony Fireman NP  Palliative Medicine Team Team Phone # [phone] Pager [phone]

## 2024-01-03 DIAGNOSIS — E162 Hypoglycemia, unspecified: Secondary | ICD-10-CM | POA: Diagnosis not present

## 2024-01-03 LAB — MAGNESIUM: Magnesium: 1.5 mg/dL — ABNORMAL LOW (ref 1.7–2.4)

## 2024-01-03 LAB — CBC
HCT: 28.7 % — ABNORMAL LOW (ref 39.0–52.0)
Hemoglobin: 9.2 g/dL — ABNORMAL LOW (ref 13.0–17.0)
MCH: 31.1 pg (ref 26.0–34.0)
MCHC: 32.1 g/dL (ref 30.0–36.0)
MCV: 97 fL (ref 80.0–100.0)
Platelets: 404 10*3/uL — ABNORMAL HIGH (ref 150–400)
RBC: 2.96 MIL/uL — ABNORMAL LOW (ref 4.22–5.81)
RDW: 19.9 % — ABNORMAL HIGH (ref 11.5–15.5)
WBC: 5.2 10*3/uL (ref 4.0–10.5)
nRBC: 0 % (ref 0.0–0.2)

## 2024-01-03 LAB — BASIC METABOLIC PANEL WITH GFR
Anion gap: 6 (ref 5–15)
BUN: 18 mg/dL (ref 8–23)
CO2: 20 mmol/L — ABNORMAL LOW (ref 22–32)
Calcium: 8.4 mg/dL — ABNORMAL LOW (ref 8.9–10.3)
Chloride: 109 mmol/L (ref 98–111)
Creatinine, Ser: 0.49 mg/dL — ABNORMAL LOW (ref 0.61–1.24)
GFR, Estimated: >60 mL/min (ref >60)
Glucose, Bld: 84 mg/dL (ref 70–99)
Potassium: 4.1 mmol/L (ref 3.5–5.1)
Sodium: 135 mmol/L (ref 135–145)

## 2024-01-03 LAB — GLUCOSE, CAPILLARY
Glucose-Capillary: 84 mg/dL (ref 70–99)
Glucose-Capillary: 97 mg/dL (ref 70–99)
Glucose-Capillary: 116 mg/dL — ABNORMAL HIGH (ref 70–99)

## 2024-01-03 MED ORDER — ADULT MULTIVITAMIN W/MINERALS CH
1.0000 | ORAL_TABLET | Freq: Every day | ORAL | Status: DC
  Administered 2024-01-03 – 2024-02-28 (×58): 1 via ORAL
  Filled 2024-01-03 (×57): qty 1

## 2024-01-03 MED ORDER — POLYETHYLENE GLYCOL 3350 17 G PO PACK
17.0000 g | PACK | Freq: Every day | ORAL | Status: DC | PRN
  Administered 2024-01-03 – 2024-01-04 (×2): 17 g via ORAL
  Filled 2024-01-03: qty 1

## 2024-01-03 MED ORDER — SENNOSIDES-DOCUSATE SODIUM 8.6-50 MG PO TABS
1.0000 | ORAL_TABLET | Freq: Every day | ORAL | Status: DC
  Administered 2024-01-03 – 2024-01-25 (×21): 1 via ORAL
  Filled 2024-01-03 (×21): qty 1

## 2024-01-03 NOTE — Plan of Care (Signed)

## 2024-01-03 NOTE — Progress Notes (Signed)
 PROGRESS NOTE  Anthony A Acree Jr.  DOB: [birth year]  PCP: Pcp, No YQM:[id number]  DOA: 12/07/2023  LOS: 26 days  Hospital Day: 28  Brief narrative: Anthony [name] Anthony Shorten. is a 68 y.o. male with PMH significant for homelessness, chronic alcoholism c/b withdrawals/DTs, chronic smoking, HTN, paroxysmal A-fib, h/o PE previously on Eliquis , hiatal hernia s/p Nissen fundoplication 2016, chronic low back pain, chronic anxiety. Recently hospitalized for opiate withdrawal treatment 5/20, patient presented to the ED for confusion, low blood sugar level  Admitted to [facility]  During this hospitalization, he has also been seen by critical care, CHF team, GI, neurology, psychiatry and palliative care.  While waiting for bed placement patient became intermittently agitated requiring Ativan  per CIWA protocol, transferred to [facility] for intermittent agitation.  Patient was placed on Precedex , Librium , clonazepam .   Patient was seen by GI, started on stress dose steroids.  Patient was also seen by advanced heart failure team for biventricular heart failure.   He was transferred to [facility] service for further management of hypoglycemia, hyponatremia.  6/12, his Cortrak removed.  Able to eat, sugars better. Sodium stable.  Per psychiatry consultation, patient lacks decision-making capacity, APS involved for placement.  Subjective: Patient was seen and examined this morning.  Lying down in bed.  Not in distress. Alert, awake, oriented to place and person.  Assessment and plan: # SIRS with hypotension 6/14, had episodes of fever up to 102.2 with drop in blood pressure WBC count normal, procalcitonin level not elevated.  Lactic acid level elevated 2.2 likely. Blood culture sent.  Urinalysis normal Started on empiric IV Zosyn  and IV vancomycin  On exam, does not look toxic. Blood pressure improved with fluid bolus No recurrence of fever in the last 24 hours Recent Labs  Lab 12/29/23 0246 12/30/23 0251 12/31/23 0239  01/02/24 0008 01/02/24 1227 01/02/24 1554 01/02/24 1852 01/03/24 1252  WBC 5.0 4.1 4.5 5.2  --   --   --  5.2  LATICACIDVEN  --   --   --   --  2.2* 0.6 1.4  --   PROCALCITON  --   --   --  <0.10  --   --   --   --    Alcoholic liver cirrhosis Alcohol  withdrawal symptoms Withdrawal symptoms resolved in the hospital with CIWA protocol and as needed Ativan .  Needed Precedex  at one point. Continue folate, multivitamin, thiamine .   Hospital delirium- resolved MRI brain showed advanced atrophy, Treated for possible wernicke. May also have hepatic encephalopathy as below. Mental status improving and stable now.  6/15, psychiatry sedated, patient lacks decision-making capacity.    Acute blood loss anemia  Retroperitoneal hematoma Chronic iron  deficiency anemia Hemoglobin dropped to the lowest of 6 on 5/29.  Diagnosed to have retroperitoneal hematoma.  Received total of 5 units of PRBC Ferritin level low at 9 in May 2025.  Iron  deficiency is likely to chronic blood loss related to liver cirrhosis Eliquis  has subsequently been resumed. No active bleeding right now.  Continue to monitor hemoglobin Recent Labs    02/04/23 0051 02/04/23 0226 02/04/23 0300 02/04/23 0807 02/05/23 0744 07/10/23 0250 07/11/23 0730 07/29/23 0859 07/30/23 0403 08/02/23 0417 08/03/23 0412 12/08/23 5284 12/08/23 1324 12/08/23 0635 12/08/23 1718 12/29/23 0246 12/30/23 0251 12/31/23 0239 01/02/24 0008 01/03/24 1252  HGB 9.6*   < >  --   --    < > 10.4*   < >  --    < > 9.5*   < >  --   --   --    < >  9.9* 9.8* 9.4* 10.5* 9.2*  MCV 101.8*  --   --   --    < > 98.8   < >  --    < > 99.3   < >  --   --   --    < > 94.5 92.9 94.2 94.2 97.0  VITAMINB12  --   --   --  1,497*  --  620  --  303  --  473  --  440  --   --   --   --   --   --   --   --   FOLATE  --   --  12.2  --   --  17.9  --   --   --  15.0  --   --  12.1  --   --   --   --   --   --   --   FERRITIN 74  --   --   --   --  18*  --   --   --   128  --   --  9*  --   --   --   --   --   --   --   TIBC 353  --   --   --   --  413  --   --   --  335  --   --  308  --   --   --   --   --   --   --   IRON  21*  --   --   --   --  23*  --   --   --  17*  --   --  42*  --   --   --   --   --   --   --   RETICCTPCT 3.3*  --   --   --   --   --   --   --   --  1.3  --   --   --  1.1  --   --   --   --   --   --    < > = values in this interval not displayed.   A-fib with RVR On amiodarone  oral 6/14, patient was in RVR likely due to fever.  Cardizem  drip was added and later stopped.   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  Not on any antihypertensives or GDMT.  H/o PE Eliquis  plan as above   MDD hold brexiprazole   Hyponatremia Na stable 127-131 Hold brexiprazole, aldactone . Encourage oral fluids, continue salt tabs. Recent Labs  Lab 12/28/23 0229 12/29/23 0246 01/02/24 0008 01/03/24 1252  NA 127* 127* 129* 135   Moderate malnutrition Eating fair. Cortrak removed 12/30/23. Encourage oral feeds, supplements. Wishes to eat soft moist food, advised RN to change order.   Homelessness TOC working on safe discharge plan. APS involved.  6/15, per psychiatry, patient lacks capacity for decision-making   Palliative on board for goals of care discussion. Case management involved for placement  Goals of care   Code Status: Limited: Do not attempt resuscitation (DNR) -DNR-LIMITED -Do Not Intubate/DNI      DVT prophylaxis:   apixaban  (ELIQUIS ) tablet 5 mg   Antimicrobials: None Fluid: None Consultants: Psychiatry, palliative care Family Communication: None at bedside.  Called and  spoke to patient's brother Anthony [name].  He was upset because of the repeated calls he is getting regarding the patient.  He repeated again that he does not want to make any decision for [place].  I asked him if patient sister Anthony [name] would be willing to and he said she is also in the same  page as he is and would serve no benefit of reaching out to her.  I am not aware of what dynamics there is between the siblings.  But at least at this time, they are not willing to make a decision for [place]  Status: Inpatient Level of care:  Progressive   Patient is from: Homeless Needs to continue in-hospital care: Disposition challenge Anticipated d/c to: Unclear at this time   Diet:  Diet Order             DIET DYS 3 Room service appropriate? Yes; Fluid consistency: Thin  Diet effective now                   Scheduled Meds:  (feeding supplement) PROSource Plus  30 mL Oral BID BM   sodium chloride    Intravenous Once   amiodarone   200 mg Oral BID   apixaban   5 mg Oral BID   feeding supplement  237 mL Oral TID BM   folic acid   1 mg Oral Daily   Gerhardt's butt cream   Topical BID   lidocaine   1 patch Transdermal Q24H   loratadine   10 mg Oral Daily   magnesium  oxide  400 mg Oral Daily   multivitamin with minerals  1 tablet Oral Daily   mouth rinse  15 mL Mouth Rinse 4 times per day   pantoprazole   40 mg Oral Daily   senna-docusate  1 tablet Oral QHS   sodium chloride  flush  10-40 mL Intracatheter Q12H   sodium chloride   1 g Oral TID WC   thiamine   100 mg Oral Daily    PRN meds: acetaminophen , fentaNYL  (SUBLIMAZE ) injection, metoprolol  tartrate, OLANZapine  zydis, ondansetron  (ZOFRAN ) IV, polyethylene glycol, sodium chloride  flush   Infusions:   piperacillin -tazobactam (ZOSYN )  IV 3.375 g (01/03/24 1341)   vancomycin  750 mg (01/03/24 1156)    Antimicrobials: Anti-infectives (From admission, onward)    Start     Dose/Rate Route Frequency Ordered Stop   01/03/24 0000  vancomycin  (VANCOREADY) IVPB 750 mg/150 mL        750 mg 150 mL/hr over 60 Minutes Intravenous Every 12 hours 01/02/24 1001     01/02/24 1100  vancomycin  (VANCOREADY) IVPB 1500 mg/300 mL        1,500 mg 150 mL/hr over 120 Minutes Intravenous  Once 01/02/24 1000 01/02/24 1325   01/02/24 0400   piperacillin -tazobactam (ZOSYN ) IVPB 3.375 g        3.375 g 12.5 mL/hr over 240 Minutes Intravenous Every 8 hours 01/02/24 0231     12/09/23 1100  metroNIDAZOLE  (FLAGYL ) IVPB 500 mg  Status:  Discontinued        500 mg 100 mL/hr over 60 Minutes Intravenous Every 12 hours 12/09/23 1004 12/14/23 1044   12/09/23 1000  cefTRIAXone  (ROCEPHIN ) 2 g in sodium chloride  0.9 % 100 mL IVPB  Status:  Discontinued        2 g 200 mL/hr over 30 Minutes Intravenous Every 24 hours 12/09/23 0908 12/14/23 1044       Objective: Vitals:   01/03/24 1652 01/03/24 1703  BP: (!) 118/91   Pulse: (!) 107 93  Resp: (!) 22 (!) 22  Temp:    SpO2: 100% 100%    Intake/Output Summary (Last 24 hours) at 01/03/2024 1804 Last data filed at 01/03/2024 1700 Gross per 24 hour  Intake 1176.15 ml  Output 1600 ml  Net -423.85 ml   Filed Weights   01/01/24 0542 01/02/24 0640 01/03/24 0500  Weight: 62.6 kg 63 kg 68.8 kg   Weight change: 5.8 kg Body mass index is 24.48 kg/m.   Physical Exam: General exam: Pleasant, lying in bed.  Not in distress. Skin: No rashes, lesions or ulcers. HEENT: Atraumatic, normocephalic, no obvious bleeding Lungs: Clear to auscultation bilaterally,  CVS: S1, S2, no murmur,   GI/Abd: Soft, nontender, nondistended, bowel sound present,   CNS: Alert, awake, oriented to place and person Psychiatry: Mood appropriate,  Extremities: No pedal edema, no calf tenderness,   Data Review: I have personally reviewed the laboratory data and studies available.  F/u labs ordered Unresulted Labs (From admission, onward)     Start     Ordered   01/03/24 1107  Basic metabolic panel with GFR  Daily,   R     Question:  Specimen collection method  Answer:  Lab=Lab collect   01/03/24 1106   01/03/24 1107  Magnesium   Daily,   R     Question:  Specimen collection method  Answer:  Lab=Lab collect   01/03/24 1106   01/03/24 1107  CBC  Daily,   R     Question:  Specimen collection method  Answer:   Lab=Lab collect   01/03/24 1106            Signed, Hoyt Macleod, MD [facility] 01/03/2024

## 2024-01-03 NOTE — Progress Notes (Signed)
 Physical Therapy Treatment Patient Details Name: Anthony A Ke Jr. MRN: [id number] DOB: 03-Oct-1955 Today's Date: 01/03/2024   History of Present Illness Pt is a 68 y.o. male who presented 12/06/23 with SOB. Shortly after arriving, pt became agitated and was given IV Ativan . CT head is unremarkable. Patient admitted for EtOH withdrawal, acute delirium, and hypoglycemia. Cortrak placed 5/23.  The day before the patient had presented to ED requesting EtOH detox; patient was deemed to be not actively in EtOH withdrawal and was discharged, however upon discharge patient threatened staff and was escorted off of the premises by [employer]. PMH includes alcohol  abuse, HTN, CVA, HFmrEF, afib, PE, HLD, anxiety, depression, chronic low back pain.    PT Comments  Pt on toilet on entry. Pt with complaints of stomach pains and no success with BM. PT encouraged pt to walk in hallway. Pt refusing even after education on benefits of ambulation. Pt is contact guard for transfers, ambulation and bed mobility. Goals reviewed and updated. D/c plans remain appropriate at this time. PT will continue to follow acutely.     If plan is discharge home, recommend the following: A lot of help with bathing/dressing/bathroom;Assistance with cooking/housework;Direct supervision/assist for medications management;Direct supervision/assist for financial management;Assist for transportation;Help with stairs or ramp for entrance;Supervision due to cognitive status;A [place] help with walking and/or transfers   Can travel by private vehicle     Yes  Equipment Recommendations  Rollator (4 wheels);BSC/3in1    Recommendations for Other Services       Precautions / Restrictions Precautions Precautions: Fall Recall of Precautions/Restrictions: Intact Restrictions Weight Bearing Restrictions Per Provider Order: No     Mobility  Bed Mobility Overal bed mobility: Needs Assistance Bed Mobility: Sit to Supine       Sit to supine: Contact  guard assist   General bed mobility comments: assistance for management of lines and leads to not tangle himself up with return to supine    Transfers Overall transfer level: Needs assistance Equipment used: Rolling walker (2 wheels) Transfers: Sit to/from Stand Sit to Stand: Contact guard assist           General transfer comment: lots of cuing to manage lines with getting up from toilet    Ambulation/Gait Ambulation/Gait assistance: Contact guard assist Gait Distance (Feet): 10 Feet Assistive device: Rolling walker (2 wheels) Gait Pattern/deviations: Step-through pattern, Decreased stride length, Trunk flexed Gait velocity: reduced Gait velocity interpretation: <1.31 ft/sec, indicative of household ambulator   General Gait Details: Pt refusing ambulation other than back to his bed, due to stomach pain from constipation. Provided education about ambulation correlation to bowel mobility. Pt uninterested.         Balance Overall balance assessment: Needs assistance Sitting-balance support: No upper extremity supported, Feet supported Sitting balance-Leahy Scale: Fair     Standing balance support: Bilateral upper extremity supported, During functional activity, Reliant on assistive device for balance Standing balance-Leahy Scale: Poor Standing balance comment: Pt dependent on RW during transfers/gait and rails during stairs. CGA required for safety/stability.                            Communication Communication Communication: Impaired;No apparent difficulties  Cognition Arousal: Alert Behavior During Therapy: Flat affect   PT - Cognitive impairments: No family/caregiver present to determine baseline, Awareness, Memory, Attention, Problem solving, Safety/Judgement  PT - Cognition Comments: uninterested in education about ambulation or positioning in improving bowel motility Following commands: Impaired Following commands  impaired: Follows one step commands with increased time    Cueing Cueing Techniques: Verbal cues, Tactile cues, Visual cues  Exercises      General Comments General comments (skin integrity, edema, etc.): VSS on RA      Pertinent Vitals/Pain Pain Assessment Pain Assessment: 0-10 Pain Score: 8  Pain Location: back Pain Descriptors / Indicators: Throbbing, Sore Pain Intervention(s): Limited activity within patient's tolerance, Monitored during session, Repositioned     PT Goals (current goals can now be found in the care plan section) Acute Rehab PT Goals Patient Stated Goal: Regain my independence PT Goal Formulation: With patient Time For Goal Achievement: 01/17/24 Potential to Achieve Goals: Good Progress towards PT goals: Not progressing toward goals - comment    Frequency    Min 2X/week       AM-PAC PT 6 Clicks Mobility   Outcome Measure  Help needed turning from your back to your side while in a flat bed without using bedrails?: A Rodas Help needed moving from lying on your back to sitting on the side of a flat bed without using bedrails?: A Lengacher Help needed moving to and from a bed to a chair (including a wheelchair)?: A Rissler Help needed standing up from a chair using your arms (e.g., wheelchair or bedside chair)?: A Ju Help needed to walk in hospital room?: A Landers Help needed climbing 3-5 steps with a railing? : A Caso 6 Click Score: 18    End of Session Equipment Utilized During Treatment: Gait belt Activity Tolerance: Patient tolerated treatment well Patient left: in bed;with call bell/phone within reach;with bed alarm set Nurse Communication: Mobility status PT Visit Diagnosis: Unsteadiness on feet (R26.81);Other abnormalities of gait and mobility (R26.89);Muscle weakness (generalized) (M62.81);Difficulty in walking, not elsewhere classified (R26.2)     Time: 1610-9604 PT Time Calculation (min) (ACUTE ONLY): 11 min  Charges:     $Therapeutic Activity: 8-22 mins PT General Charges $$ ACUTE PT VISIT: 1 Visit                     Anthony [name] PT, DPT Acute Rehabilitation Services Please use secure chat or  Call Office [phone]    Anthony [name] [facility] 01/03/2024, 10:21 AM

## 2024-01-03 NOTE — TOC Progression Note (Addendum)
 Transition of Care (TOC) - Progression Note    Patient Details  Name: Anthony [name]. MRN: [id number] Date of Birth: [birth year]  Transition of Care [facility]) CM/SW Contact  Carmon Christen, LCSWA Phone Number: 01/03/2024, 4:50 PM  Clinical Narrative:     CSW awaiting to hear back from [place] with APS [phone]) to confirm if report was accepted to help determine how to proceed with patients disposition/dc plan for patient.  Expected Discharge Plan: Skilled Nursing Facility Barriers to Discharge: Continued Medical Work up  Expected Discharge Plan and Services In-house Referral: Clinical Social Work     Living arrangements for the past 2 months: Homeless                                       Social Determinants of Health (SDOH) Interventions SDOH Screenings   Food Insecurity: Patient Unable To Answer (12/09/2023)  Housing: Patient Unable To Answer (12/09/2023)  Transportation Needs: Patient Unable To Answer (12/09/2023)  Utilities: Patient Unable To Answer (12/09/2023)  Depression (PHQ2-9): Medium Risk (10/16/2020)  Social Connections: Unknown (12/22/2023)  Tobacco Use: High Risk (12/08/2023)    Readmission Risk Interventions    08/27/2023   12:21 PM 08/19/2023    9:17 AM 07/12/2023    9:58 AM  Readmission Risk Prevention Plan  Transportation Screening Complete Complete Complete  Medication Review Oceanographer) Complete Complete Complete  PCP or Specialist appointment within 3-5 days of discharge Complete  --  HRI or Home Care Consult Complete Complete Complete  SW Recovery Care/Counseling Consult Complete Complete Complete  Palliative Care Screening Not Applicable Not Applicable Not Applicable  Skilled Nursing Facility Not Applicable Not Applicable Not Applicable

## 2024-01-03 NOTE — Plan of Care (Signed)
  Problem: Pain Managment: Goal: General experience of comfort will improve and/or be controlled Outcome: Progressing   Problem: Safety: Goal: Ability to remain free from injury will improve Outcome: Progressing   Problem: Elimination: Goal: Will not experience complications related to bowel motility Outcome: Progressing

## 2024-01-03 NOTE — Progress Notes (Addendum)
 Nutrition Follow-up  DOCUMENTATION CODES:   Non-severe (moderate) malnutrition in context of chronic illness  INTERVENTION:   Continue Magic cup TID with meals, each supplement provides 290 kcal and 9 grams of protein Continue to offer Ensure Plus High Protein po TID, each supplement provides 350 kcal and 20 grams of protein Continue Prosource Plus PO BID Add MVI with minerals daily  NUTRITION DIAGNOSIS:   Moderate Malnutrition related to social / environmental circumstances (ETOH use, homeless) as evidenced by moderate fat depletion, moderate muscle depletion.  Ongoing   GOAL:   Patient will meet greater than or equal to 90% of their needs  Progressing   MONITOR:   Diet advancement, Labs, Weight trends, TF tolerance  REASON FOR ASSESSMENT:   Consult Assessment of nutrition requirement/status  ASSESSMENT:   68 yo male admitted with EtOH withdrawal with DTs, lactic acidosis, concern for possible sepsis-?biliary source. PMH includes EtOH abuse with recurrent admissions for DTs, tobacco use, hiatal hernia s/p nissen (2016), HTN. Pt is homeless  Cortrak removed 6/12. Patient is currently on a dysphagia 3 diet with thin liquids, extra gravies and sauces.  Meal intakes 90-100% 6/13-6/14. He is drinking 1-3 Ensure Plus High Protein supplements per day as well as Prosource Plus twice daily. He is receiving magic cups with meals.  Labs reviewed. Mag 1.5 CBG: [phone]  Medications reviewed and include folic acid , mag-ox, senokot-s, thiamine .  Admit weight 65 kg Current weight 68.8 kg  Diet Order:   Diet Order             DIET DYS 3 Room service appropriate? Yes; Fluid consistency: Thin  Diet effective now                   EDUCATION NEEDS:   No education needs have been identified at this time  Skin:  Skin Assessment: Skin Integrity Issues: Skin Integrity Issues:: Stage III Stage II: N/A Stage III: L tibial Other: IAD to rectum, full thickness lt lower  leg wound  Last BM:  6/15  Height:   Ht Readings from Last 1 Encounters:  12/22/23 5' 6 (1.676 m)    Weight:   Wt Readings from Last 1 Encounters:  01/03/24 68.8 kg    Ideal Body Weight:  70 kg  BMI:  Body mass index is 24.48 kg/m.  Estimated Nutritional Needs:   Kcal:  1900-2100  Protein:  95-110g  Fluid:  >/=1.9L   Barnet Boots RD, LDN, CNSC Contact via secure chat. If unavailable, use group chat RD Inpatient.

## 2024-01-04 DIAGNOSIS — E162 Hypoglycemia, unspecified: Secondary | ICD-10-CM | POA: Diagnosis not present

## 2024-01-04 LAB — CBC
HCT: 27.9 % — ABNORMAL LOW (ref 39.0–52.0)
Hemoglobin: 8.8 g/dL — ABNORMAL LOW (ref 13.0–17.0)
MCH: 30.7 pg (ref 26.0–34.0)
MCHC: 31.5 g/dL (ref 30.0–36.0)
MCV: 97.2 fL (ref 80.0–100.0)
Platelets: 386 10*3/uL (ref 150–400)
RBC: 2.87 MIL/uL — ABNORMAL LOW (ref 4.22–5.81)
RDW: 20.1 % — ABNORMAL HIGH (ref 11.5–15.5)
WBC: 5 10*3/uL (ref 4.0–10.5)
nRBC: 0 % (ref 0.0–0.2)

## 2024-01-04 LAB — BASIC METABOLIC PANEL WITH GFR
Anion gap: 9 (ref 5–15)
BUN: 19 mg/dL (ref 8–23)
CO2: 17 mmol/L — ABNORMAL LOW (ref 22–32)
Calcium: 8.7 mg/dL — ABNORMAL LOW (ref 8.9–10.3)
Chloride: 106 mmol/L (ref 98–111)
Creatinine, Ser: 0.4 mg/dL — ABNORMAL LOW (ref 0.61–1.24)
GFR, Estimated: >60 mL/min (ref >60)
Glucose, Bld: 92 mg/dL (ref 70–99)
Potassium: 4 mmol/L (ref 3.5–5.1)
Sodium: 132 mmol/L — ABNORMAL LOW (ref 135–145)

## 2024-01-04 LAB — MAGNESIUM: Magnesium: 1.3 mg/dL — ABNORMAL LOW (ref 1.7–2.4)

## 2024-01-04 LAB — GLUCOSE, CAPILLARY
Glucose-Capillary: 91 mg/dL (ref 70–99)
Glucose-Capillary: 81 mg/dL (ref 70–99)
Glucose-Capillary: 93 mg/dL (ref 70–99)

## 2024-01-04 MED ORDER — NAPHAZOLINE-GLYCERIN 0.012-0.25 % OP SOLN
1.0000 [drp] | Freq: Four times a day (QID) | OPHTHALMIC | Status: DC | PRN
  Administered 2024-01-06: 2 [drp] via OPHTHALMIC
  Administered 2024-01-16: 1 [drp] via OPHTHALMIC
  Filled 2024-01-04 (×2): qty 15

## 2024-01-04 MED ORDER — MAGNESIUM SULFATE 2 GM/50ML IV SOLN
2.0000 g | Freq: Once | INTRAVENOUS | Status: AC
  Administered 2024-01-04: 2 g via INTRAVENOUS
  Filled 2024-01-04: qty 50

## 2024-01-04 MED ORDER — MAGNESIUM SULFATE 4 GM/100ML IV SOLN
4.0000 g | Freq: Once | INTRAVENOUS | Status: AC
  Administered 2024-01-04: 4 g via INTRAVENOUS
  Filled 2024-01-04: qty 100

## 2024-01-04 NOTE — Plan of Care (Signed)
   Problem: Elimination: Goal: Will not experience complications related to bowel motility Outcome: Progressing Goal: Will not experience complications related to urinary retention Outcome: Progressing   Problem: Pain Managment: Goal: General experience of comfort will improve and/or be controlled Outcome: Progressing   Problem: Safety: Goal: Ability to remain free from injury will improve Outcome: Progressing

## 2024-01-04 NOTE — Progress Notes (Signed)
 Occupational Therapy Treatment Patient Details Name: Anthony A Marinos Jr. MRN: [id number] DOB: 1955-11-27 Today's Date: 01/04/2024   History of present illness Pt is a 68 yr old male who presented 12/06/23 with SOB. Shortly after arriving, pt became agitated and was given IV Ativan . CT head is unremarkable. Patient admitted for ETOH withdrawal, acute delirium, and hypoglycemia. The day before the patient had presented to ED requesting ETOH detox; patient was deemed to be not actively in ETOH withdrawal and was discharged, however upon discharge patient threatened staff and was escorted off of the premises by [employer]. PMH includes alcohol  abuse, HTN, CVA, HFmrEF, afib, PE, HLD, anxiety, depression, chronic low back pain.   OT comments  The pt was calm and cooperative. He was seen for functional strengthening and progression of functional activity. He required CGA to transfer to the EOB, then moderate assist for sock management. He subsequently required assist to stand and to demo short distance ambulation, needing a RW for support in standing. He reported a history of significant chronic back pain, as a result of a fall off a roof ~15 yrs ago; he stated he broke 3 vertebrae in his spine. He subsequently reported fatigue with activity, and occasional difficulty with managing self-care tasks, given the aforementioned. Continue OT plan of care. Patient will benefit from continued inpatient follow up therapy, <3 hours/day.       If plan is discharge home, recommend the following:  Assist for transportation;A lot of help with bathing/dressing/bathroom;Direct supervision/assist for medications management;A [place] help with walking and/or transfers   Equipment Recommendations  Other (comment) (defer to next level of care)    Recommendations for Other Services      Precautions / Restrictions Precautions Precautions: Fall Restrictions Weight Bearing Restrictions Per Provider Order: No       Mobility Bed  Mobility Overal bed mobility: Needs Assistance Bed Mobility: Supine to Sit     Supine to sit: Contact guard Sit to supine: Contact guard assist        Transfers Overall transfer level: Needs assistance Equipment used: Rolling walker (2 wheels) Transfers: Sit to/from Stand Sit to Stand: Contact guard assist                 Balance     Sitting balance-Leahy Scale: Good         Standing balance comment: CGA to min assist              ADL either performed or assessed with clinical judgement   ADL Overall ADL's : Needs assistance/impaired                     Lower Body Dressing: Moderate assistance;Sitting/lateral leans Lower Body Dressing Details (indicate cue type and reason): to manage socks seated EOB                              Communication Communication Communication: No apparent difficulties   Cognition Arousal: Alert Behavior During Therapy: WFL for tasks assessed/performed Cognition: Cognition impaired     Awareness: Online awareness intact     Executive functioning impairment (select all impairments): Reasoning          Following commands impaired:  (Followed 1 step commands consistently)                    Pertinent Vitals/ Pain       Pain Assessment Pain Assessment: 0-10 Pain Location: chronic  back pain Pain Intervention(s): Limited activity within patient's tolerance, RN gave pain meds during session, Monitored during session, Repositioned   Frequency  Min 2X/week        Progress Toward Goals  OT Goals(current goals can now be found in the care plan section)  Progress towards OT goals: Progressing toward goals  Acute Rehab OT Goals OT Goal Formulation: With patient Time For Goal Achievement: 01/17/24 Potential to Achieve Goals: Good  Plan         AM-PAC OT 6 Clicks Daily Activity     Outcome Measure   Help from another person eating meals?: None Help from another person taking care of  personal grooming?: A [place]ark[place] Help from another person toileting, which includes using toliet, bedpan, or urinal?: A Reep Help from another person bathing (including washing, rinsing, drying)?: A [place] Help from another person to put on and taking off regular upper body clothing?: A [place] Help from another person to put on and taking off regular lower body clothing?: A [place]af 6 Click Score: 19    End of Session Equipment Utilized During Treatment: Rolling walker (2 wheels)  OT Visit Diagnosis: Unsteadiness on feet (R26.81);Muscle weakness (generalized) (M62.81);Other abnormalities of gait and mobility (R26.89)   Activity Tolerance Patient tolerated treatment well   Patient Left in bed;with call bell/phone within reach;with bed alarm set   Nurse Communication Other (comment) (nurse informed pt requested Tylenol  and allergy medicine. She was also informed of his condom cath coming off during the session)        Time: 0981-1914 OT Time Calculation (min): 21 min  Charges: OT General Charges $OT Visit: 1 Visit OT Treatments $Therapeutic Activity: 8-22 mins     Sheralyn Dies, OTR/L 01/04/2024, 5:07 PM

## 2024-01-04 NOTE — Progress Notes (Addendum)
 PROGRESS NOTE  Anthony A Volkman Jr.  DOB: 1956-04-25  PCP: Pcp, No AOZ:[id number]  DOA: 12/07/2023  LOS: 27 days  Hospital Day: 29  Brief narrative: Anthony A Maddy [name] is a 68 y.o. male with PMH significant for homelessness, chronic alcoholism c/b withdrawals/DTs, chronic smoking, HTN, paroxysmal A-fib, h/o PE previously on Eliquis , hiatal hernia s/p Nissen fundoplication 2016, chronic low back pain, chronic anxiety. Recently hospitalized for opiate withdrawal treatment 5/20, patient presented to the ED for confusion, low blood sugar level  Admitted to [facility]  During this hospitalization, he has also been seen by critical care, CHF team, GI, neurology, psychiatry and palliative care.  While waiting for bed placement patient became intermittently agitated requiring Ativan  per CIWA protocol, transferred to [facility] for intermittent agitation.  Patient was placed on Precedex , Librium , clonazepam .   Patient was seen by GI, started on stress dose steroids.  Patient was also seen by advanced heart failure team for biventricular heart failure.   He was transferred to [facility] service for further management of hypoglycemia, hyponatremia.  6/12, his Cortrak removed.  Able to eat, sugars better. Sodium stable.  Per psychiatry consultation, patient lacks decision-making capacity, APS involved for placement.  Subjective: Patient was seen and examined this morning.   Lying down in bed.  Not in distress.  No new symptoms. Labs from this morning reviewed.  Magnesium  low at 1.3.  Replacement ordered hemoglobin 8.8  Assessment and plan: # SIRS with hypotension 6/14, had episodes of fever up to 102.2 with drop in blood pressure WBC count normal, procalcitonin level not elevated.  Lactic acid level was elevated 2.2 . Blood culture sent.  Urinalysis normal Started on empiric IV Zosyn  and IV vancomycin  On exam, does not look toxic. Blood pressure improved with fluid bolus No recurrence of fever in the last 48 hours.   Complete 5-day course of antibiotics with IV Zosyn . No history of MRSA infection.  Can stop IV vancomycin  today. Recent Labs  Lab 12/30/23 0251 12/31/23 0239 01/02/24 0008 01/02/24 1227 01/02/24 1554 01/02/24 1852 01/03/24 1252 01/04/24 0531  WBC 4.1 4.5 5.2  --   --   --  5.2 5.0  LATICACIDVEN  --   --   --  2.2* 0.6 1.4  --   --   PROCALCITON  --   --  <0.10  --   --   --   --   --    Alcoholic liver cirrhosis Alcohol  withdrawal symptoms Withdrawal symptoms resolved in the hospital with CIWA protocol and as needed Ativan .  Needed Precedex  at one point. Continue folate, multivitamin, thiamine .   Hospital delirium- resolved MRI brain showed advanced atrophy, Treated for possible wernicke. May also have hepatic encephalopathy as below. Mental status improving and stable now.  6/15, psychiatry sedated, patient lacks decision-making capacity.    Acute blood loss anemia  Retroperitoneal hematoma Chronic iron  deficiency anemia Hemoglobin dropped to the lowest of 6 on 5/29.  Diagnosed to have retroperitoneal hematoma.  Received total of 5 units of PRBC Ferritin level low at 9 in May 2025.  Iron  deficiency is likely to chronic blood loss related to liver cirrhosis Eliquis  has subsequently been resumed. No active bleeding right now.  Continue to monitor hemoglobin Recent Labs    02/04/23 0051 02/04/23 0226 02/04/23 0300 02/04/23 9629 02/05/23 0744 07/10/23 0250 07/11/23 0730 07/29/23 5284 07/30/23 0403 08/02/23 1324 08/03/23 4010 12/08/23 2725 12/08/23 3664 12/08/23 4034 12/08/23 1718 12/30/23 0251 12/31/23 0239 01/02/24 0008 01/03/24 1252 01/04/24 7425  HGB 9.6*   < >  --   --    < > 10.4*   < >  --    < > 9.5*   < >  --   --   --    < > 9.8* 9.4* 10.5* 9.2* 8.8*  MCV 101.8*  --   --   --    < > 98.8   < >  --    < > 99.3   < >  --   --   --    < > 92.9 94.2 94.2 97.0 97.2  VITAMINB12  --   --   --  1,497*  --  620  --  303  --  473  --  440  --   --   --   --   --    --   --   --   FOLATE  --   --  12.2  --   --  17.9  --   --   --  15.0  --   --  12.1  --   --   --   --   --   --   --   FERRITIN 74  --   --   --   --  18*  --   --   --  128  --   --  9*  --   --   --   --   --   --   --   TIBC 353  --   --   --   --  413  --   --   --  335  --   --  308  --   --   --   --   --   --   --   IRON  21*  --   --   --   --  23*  --   --   --  17*  --   --  42*  --   --   --   --   --   --   --   RETICCTPCT 3.3*  --   --   --   --   --   --   --   --  1.3  --   --   --  1.1  --   --   --   --   --   --    < > = values in this interval not displayed.   A-fib with RVR On amiodarone  oral 6/14, patient was in RVR likely due to fever.  Cardizem  drip was added and later stopped.   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  Not on any antihypertensives or GDMT.  H/o PE Eliquis  plan as above   MDD hold brexiprazole   Hyponatremia Na stable 127-131 Hold brexiprazole, aldactone . Encourage oral fluids, continue salt tabs 1 g 3 times daily. Recent Labs  Lab 12/29/23 0246 01/02/24 0008 01/03/24 1252 01/04/24 0531  NA 127* 129* 135 132*   Hypomagnesemia Magnesium  level persistently low.  On oral and IV replacement Recent Labs  Lab 12/29/23 0246 01/02/24 0008 01/02/24 1227 01/03/24 1252 01/04/24 0531  K 4.1 4.2  --  4.1 4.0  MG  --  1.3* 1.5* 1.5* 1.3*   Moderate malnutrition Eating fair. Cortrak removed 12/30/23. Encourage oral feeds, supplements.  Wishes to eat soft moist food, advised RN to change order.   Homelessness TOC working on safe discharge plan. APS involved.  6/15, per psychiatry, patient lacks capacity for decision-making   Palliative on board for goals of care discussion. Case management involved for placement  Goals of care   Code Status: Limited: Do not attempt resuscitation (DNR) -DNR-LIMITED -Do Not Intubate/DNI      DVT prophylaxis:   apixaban  (ELIQUIS )  tablet 5 mg   Antimicrobials: None Fluid: None Consultants: Psychiatry, palliative care Family Communication: None at bedside.   6/16, ID called and spoke to patient's brother Anthony [name].  He was upset because of the repeated calls he is getting regarding the patient.  He repeated again that he does not want to make any decision for Anthony [name].  I asked him if patient sister Anthony [name] would be willing to and he said she is also in the same page as he is and trying to talk to her about the same thing would not serve any benefit.  I am not aware of what dynamics there is between the siblings.  But at least at this time, they are not willing to make a decision for Anthony [name]. Palliative care consult following.  Status: Inpatient Level of care:  Progressive   Patient is from: Homeless Needs to continue in-hospital care: Disposition challenge Anticipated d/c to: Unclear at this time   Diet:  Diet Order             DIET DYS 3 Room service appropriate? Yes; Fluid consistency: Thin  Diet effective now                   Scheduled Meds:  (feeding supplement) PROSource Plus  30 mL Oral BID BM   sodium chloride    Intravenous Once   amiodarone   200 mg Oral BID   apixaban   5 mg Oral BID   feeding supplement  237 mL Oral TID BM   folic acid   1 mg Oral Daily   Gerhardt's butt cream   Topical BID   lidocaine   1 patch Transdermal Q24H   loratadine   10 mg Oral Daily   magnesium  oxide  400 mg Oral Daily   multivitamin with minerals  1 tablet Oral Daily   mouth rinse  15 mL Mouth Rinse 4 times per day   pantoprazole   40 mg Oral Daily   senna-docusate  1 tablet Oral QHS   sodium chloride  flush  10-40 mL Intracatheter Q12H   sodium chloride   1 g Oral TID WC   thiamine   100 mg Oral Daily    PRN meds: acetaminophen , fentaNYL  (SUBLIMAZE ) injection, metoprolol  tartrate, naphazoline-glycerin, OLANZapine  zydis, ondansetron  (ZOFRAN ) IV, polyethylene glycol, sodium chloride  flush   Infusions:   magnesium  sulfate  bolus IVPB     piperacillin -tazobactam (ZOSYN )  IV 3.375 g (01/04/24 0551)   vancomycin  750 mg (01/03/24 2312)    Antimicrobials: Anti-infectives (From admission, onward)    Start     Dose/Rate Route Frequency Ordered Stop   01/03/24 0000  vancomycin  (VANCOREADY) IVPB 750 mg/150 mL        750 mg 150 mL/hr over 60 Minutes Intravenous Every 12 hours 01/02/24 1001     01/02/24 1100  vancomycin  (VANCOREADY) IVPB 1500 mg/300 mL        1,500 mg 150 mL/hr over 120 Minutes Intravenous  Once 01/02/24 1000 01/02/24 1325   01/02/24 0400  piperacillin -tazobactam (ZOSYN ) IVPB 3.375 g        3.375 g  12.5 mL/hr over 240 Minutes Intravenous Every 8 hours 01/02/24 0231     12/09/23 1100  metroNIDAZOLE  (FLAGYL ) IVPB 500 mg  Status:  Discontinued        500 mg 100 mL/hr over 60 Minutes Intravenous Every 12 hours 12/09/23 1004 12/14/23 1044   12/09/23 1000  cefTRIAXone  (ROCEPHIN ) 2 g in sodium chloride  0.9 % 100 mL IVPB  Status:  Discontinued        2 g 200 mL/hr over 30 Minutes Intravenous Every 24 hours 12/09/23 0908 12/14/23 1044       Objective: Vitals:   01/04/24 0554 01/04/24 0720  BP:  111/81  Pulse:  93  Resp:  18  Temp: 98.2 F (36.8 C) 98.1 F (36.7 C)  SpO2:  98%    Intake/Output Summary (Last 24 hours) at 01/04/2024 1132 Last data filed at 01/04/2024 0900 Gross per 24 hour  Intake 1491.64 ml  Output 1100 ml  Net 391.64 ml   Filed Weights   01/02/24 0640 01/03/24 0500 01/04/24 0554  Weight: 63 kg 68.8 kg 66.5 kg   Weight change: -2.3 kg Body mass index is 23.66 kg/m.   Physical Exam: General exam: Pleasant, lying in bed.  Not in distress. Skin: No rashes, lesions or ulcers. HEENT: Atraumatic, normocephalic, no obvious bleeding Lungs: Clear to auscultation bilaterally,  CVS: S1, S2, no murmur,   GI/Abd: Soft, nontender, nondistended, bowel sound present,   CNS: Alert, awake, oriented to place and person Psychiatry: Mood appropriate,  Extremities: No pedal edema, no  calf tenderness,   Data Review: I have personally reviewed the laboratory data and studies available.  F/u labs ordered Unresulted Labs (From admission, onward)     Start     Ordered   01/03/24 1107  Basic metabolic panel with GFR  Daily,   R     Question:  Specimen collection method  Answer:  Lab=Lab collect   01/03/24 1106   01/03/24 1107  Magnesium   Daily,   R     Question:  Specimen collection method  Answer:  Lab=Lab collect   01/03/24 1106   01/03/24 1107  CBC  Daily,   R     Question:  Specimen collection method  Answer:  Lab=Lab collect   01/03/24 1106            Signed, Hoyt Macleod, MD [facility] 01/04/2024

## 2024-01-04 NOTE — Plan of Care (Signed)

## 2024-01-04 NOTE — TOC Progression Note (Signed)
 Transition of Care (TOC) - Progression Note    Patient Details  Name: Anthony A Ottey Jr. MRN: [id number] Date of Birth: 1955/10/23  Transition of Care [facility]) CM/SW Contact  Carmon Christen, LCSWA Phone Number: 01/04/2024, 1:55 PM  Clinical Narrative:     CSW LVM for Josefina Nian with APS. CSW awaiting call back.  Expected Discharge Plan: Skilled Nursing Facility Barriers to Discharge: Continued Medical Work up  Expected Discharge Plan and Services In-house Referral: Clinical Social Work     Living arrangements for the past 2 months: Homeless                                       Social Determinants of Health (SDOH) Interventions SDOH Screenings   Food Insecurity: Patient Unable To Answer (12/09/2023)  Housing: Patient Unable To Answer (12/09/2023)  Transportation Needs: Patient Unable To Answer (12/09/2023)  Utilities: Patient Unable To Answer (12/09/2023)  Depression (PHQ2-9): Medium Risk (10/16/2020)  Social Connections: Unknown (12/22/2023)  Tobacco Use: High Risk (12/08/2023)    Readmission Risk Interventions    08/27/2023   12:21 PM 08/19/2023    9:17 AM 07/12/2023    9:58 AM  Readmission Risk Prevention Plan  Transportation Screening Complete Complete Complete  Medication Review Oceanographer) Complete Complete Complete  PCP or Specialist appointment within 3-5 days of discharge Complete  --  HRI or Home Care Consult Complete Complete Complete  SW Recovery Care/Counseling Consult Complete Complete Complete  Palliative Care Screening Not Applicable Not Applicable Not Applicable  Skilled Nursing Facility Not Applicable Not Applicable Not Applicable

## 2024-01-05 DIAGNOSIS — E162 Hypoglycemia, unspecified: Secondary | ICD-10-CM | POA: Diagnosis not present

## 2024-01-05 LAB — CBC
HCT: 29.2 % — ABNORMAL LOW (ref 39.0–52.0)
Hemoglobin: 9.3 g/dL — ABNORMAL LOW (ref 13.0–17.0)
MCH: 31 pg (ref 26.0–34.0)
MCHC: 31.8 g/dL (ref 30.0–36.0)
MCV: 97.3 fL (ref 80.0–100.0)
Platelets: 371 K/uL (ref 150–400)
RBC: 3 MIL/uL — ABNORMAL LOW (ref 4.22–5.81)
RDW: 19.9 % — ABNORMAL HIGH (ref 11.5–15.5)
WBC: 4.8 K/uL (ref 4.0–10.5)
nRBC: 0.4 % — ABNORMAL HIGH (ref 0.0–0.2)

## 2024-01-05 LAB — GLUCOSE, CAPILLARY
Glucose-Capillary: 101 mg/dL — ABNORMAL HIGH (ref 70–99)
Glucose-Capillary: 116 mg/dL — ABNORMAL HIGH (ref 70–99)
Glucose-Capillary: 140 mg/dL — ABNORMAL HIGH (ref 70–99)

## 2024-01-05 LAB — BASIC METABOLIC PANEL WITH GFR
Anion gap: 8 (ref 5–15)
BUN: 17 mg/dL (ref 8–23)
CO2: 21 mmol/L — ABNORMAL LOW (ref 22–32)
Calcium: 8.7 mg/dL — ABNORMAL LOW (ref 8.9–10.3)
Chloride: 101 mmol/L (ref 98–111)
Creatinine, Ser: 0.47 mg/dL — ABNORMAL LOW (ref 0.61–1.24)
GFR, Estimated: >60 mL/min (ref >60)
Glucose, Bld: 91 mg/dL (ref 70–99)
Potassium: 4.2 mmol/L (ref 3.5–5.1)
Sodium: 130 mmol/L — ABNORMAL LOW (ref 135–145)

## 2024-01-05 LAB — MAGNESIUM: Magnesium: 1.6 mg/dL — ABNORMAL LOW (ref 1.7–2.4)

## 2024-01-05 MED ORDER — MAGNESIUM SULFATE 4 GM/100ML IV SOLN
4.0000 g | Freq: Once | INTRAVENOUS | Status: AC
  Administered 2024-01-05: 4 g via INTRAVENOUS
  Filled 2024-01-05: qty 100

## 2024-01-05 NOTE — TOC Progression Note (Signed)
 Transition of Care (TOC) - Progression Note    Patient Details  Name: Noor A Holle Jr. MRN: [id number] Date of Birth: August 17, 1955  Transition of Care [facility]) CM/SW Contact  Carmon Christen, LCSWA Phone Number: 01/05/2024, 12:30 PM  Clinical Narrative:     CSW Lvm with Josefina Nian with APS to follow up on status of APS report. CSW awaiting call back.  Expected Discharge Plan: Skilled Nursing Facility Barriers to Discharge: Continued Medical Work up  Expected Discharge Plan and Services In-house Referral: Clinical Social Work     Living arrangements for the past 2 months: Homeless                                       Social Determinants of Health (SDOH) Interventions SDOH Screenings   Food Insecurity: Patient Unable To Answer (12/09/2023)  Housing: Patient Unable To Answer (12/09/2023)  Transportation Needs: Patient Unable To Answer (12/09/2023)  Utilities: Patient Unable To Answer (12/09/2023)  Depression (PHQ2-9): Medium Risk (10/16/2020)  Social Connections: Unknown (12/22/2023)  Tobacco Use: High Risk (12/08/2023)    Readmission Risk Interventions    08/27/2023   12:21 PM 08/19/2023    9:17 AM 07/12/2023    9:58 AM  Readmission Risk Prevention Plan  Transportation Screening Complete Complete Complete  Medication Review Oceanographer) Complete Complete Complete  PCP or Specialist appointment within 3-5 days of discharge Complete  --  HRI or Home Care Consult Complete Complete Complete  SW Recovery Care/Counseling Consult Complete Complete Complete  Palliative Care Screening Not Applicable Not Applicable Not Applicable  Skilled Nursing Facility Not Applicable Not Applicable Not Applicable

## 2024-01-05 NOTE — Hospital Course (Addendum)
 68 y.o. male with PMH significant for homelessness, chronic alcoholism c/b withdrawals/DTs, chronic smoking, HTN, paroxysmal A-fib, h/o PE previously on Eliquis , hiatal hernia s/p Nissen fundoplication 2016, chronic low back pain, chronic anxiety, opiate withdrawal presented to the ED for confusion, low blood sugar.  From hospital stay.  Admitted on 12/07/2023. Seen by critical care, Cardiology, GI, neurology, psychiatry and palliative care.  Assesement and Plan: SIRS  Resolved.  Completed empiric 5-day course of Zosyn .  Remains afebrile and stable  Orthostatic hypotension. Blood pressure dropped significantly on standing up. Received IV fluid bolus.  Initiated on midodrine . Due to CHF, not a good candidate for Florinef . Able to ambulate after initiation of midodrine . Continue with abdominal binder and compression stockings as well. Cosyntropin  stim test negative.    Acute metabolic encephalopathy secondary to alcohol  withdrawal  Currently resolved. Requiring ICU stay with Precedex .  Monitor.   Retroperitoneal hematoma/chronic iron  deficiency anemia/acute blood loss anemia  S/p 5 units total RBCs during course of hospital stay. Hemoglobin stable.  No active bleeding.  Continue Eliquis    Paroxysmal A-fib with RVR - Initially on Cardizem  drip.  Has intermittent tachycardia.   Would avoid Cardizem  secondary to low EF. Currently on amiodarone  and Eliquis .  Acute on chronic HFrEF  EF in the past have been around 45%. EF this admission is 35 to 45%. Cardiology was consulted earlier. GDMT is limited by hypotension. Appears to be euvolemic for now.   Hyponatremia  mild, overall stable.  Currently on urea  packet only.  Hypokalemia/hypomagnesemia  Providing IV and oral magnesium  replacement. Maintain K more than 4.   Goals of care - Patient is DNR.  Palliative care was consulted.  Disposition. Patient was homeless, TOC working on dispo discharge disposition. Per psychiatry 6/15 the  patient lacks capacity for decision-making. However, pt is now very conversive, oriented, able to make his own decisions.   APS involved however now stating they cannot pursue guardianship.  Appears patient does not want to pursue ALF and stated he would walk out if placed there. Is willing to go back to the community either boarding house or similar. Working closely with TOC on disposition planning aligning with the patient's goals.   Moderate protein calorie malnutrition. Continue supplementation.  Reported diarrhea. Constipated. Patient reports that he has multiple diarrhea although RN is not able to corroborate with me about that history.  Diarrhea will explain hypokalemia and hypomagnesemia though.  For now monitor. Will discontinue bowel regimen.

## 2024-01-05 NOTE — Plan of Care (Signed)

## 2024-01-05 NOTE — Progress Notes (Signed)
 Physical Therapy Treatment Patient Details Name: Anthony A Portugal Jr. MRN: [id number] DOB: 01-12-1956 Today's Date: 01/05/2024   History of Present Illness Pt is a 68 y.o. male who presented 12/06/23 with SOB. Shortly after arriving, pt became agitated and was given IV Ativan . CT head is unremarkable. Patient admitted for EtOH withdrawal, acute delirium, and hypoglycemia. Cortrak placed 5/23.  The day before the patient had presented to ED requesting EtOH detox; patient was deemed to be not actively in EtOH withdrawal and was discharged, however upon discharge patient threatened staff and was escorted off of the premises by [employer]. PMH includes alcohol  abuse, HTN, CVA, HFmrEF, afib, PE, HLD, anxiety, depression, chronic low back pain.    PT Comments  Patient is agreeable to PT session with encouragement. Two standing bouts performed with CGA. Mild dyspnea with exertion with heart rate up to 122bpm while sitting. Patient declined walking today. Education on energy conservation strategies. Recommend to continue PT to maximize independence and facilitate return to prior level of function.    If plan is discharge home, recommend the following: A lot of help with bathing/dressing/bathroom;Assistance with cooking/housework;Direct supervision/assist for medications management;Direct supervision/assist for financial management;Assist for transportation;Help with stairs or ramp for entrance;Supervision due to cognitive status;A [place] help with walking and/or transfers   Can travel by private vehicle     Yes  Equipment Recommendations  Rollator (4 wheels);BSC/3in1    Recommendations for Other Services       Precautions / Restrictions Precautions Precautions: Fall Recall of Precautions/Restrictions: Intact Restrictions Weight Bearing Restrictions Per Provider Order: No     Mobility  Bed Mobility Overal bed mobility: Needs Assistance Bed Mobility: Supine to Sit, Sit to Supine     Supine to sit:  Contact guard Sit to supine: Min assist   General bed mobility comments: assistance for RLE support to return to bed. cues for task initiation    Transfers Overall transfer level: Needs assistance Equipment used: Rolling walker (2 wheels) Transfers: Sit to/from Stand Sit to Stand: Contact guard assist           General transfer comment: 2 standing bouts performed. cues for safety    Ambulation/Gait               General Gait Details: patient declined walking at this time   Stairs             Wheelchair Mobility     Tilt Bed    Modified Rankin (Stroke Patients Only)       Balance Overall balance assessment: Needs assistance Sitting-balance support: No upper extremity supported, Feet supported Sitting balance-Leahy Scale: Good     Standing balance support: Single extremity supported Standing balance-Leahy Scale: Poor Standing balance comment: external support required to maintain standing balance                            Communication Communication Communication: No apparent difficulties  Cognition Arousal: Alert Behavior During Therapy: WFL for tasks assessed/performed   PT - Cognitive impairments: Safety/Judgement, Awareness                       PT - Cognition Comments: decreased awareness of deficits and physical limitations. needs encouragement Following commands: Impaired Following commands impaired: Follows one step commands with increased time    Cueing Cueing Techniques: Verbal cues  Exercises      General Comments General comments (skin integrity, edema, etc.): mild dyspnea initially  with sitting up. Sp02 98-100% on room air. heart rate up to 122 while sitting. education on energy conservation techniques      Pertinent Vitals/Pain Pain Assessment Pain Assessment: Faces Faces Pain Scale: Hurts a Kerstein bit Pain Location: chronic back pain Pain Descriptors / Indicators: Sore Pain Intervention(s):  Monitored during session, Limited activity within patient's tolerance, Repositioned    Home Living                          Prior Function            PT Goals (current goals can now be found in the care plan section) Acute Rehab PT Goals Patient Stated Goal: to go to rehab if possible PT Goal Formulation: With patient Time For Goal Achievement: 01/17/24 Potential to Achieve Goals: Good Progress towards PT goals: Progressing toward goals    Frequency    Min 2X/week      PT Plan      Co-evaluation              AM-PAC PT 6 Clicks Mobility   Outcome Measure  Help needed turning from your back to your side while in a flat bed without using bedrails?: A Wollschlager Help needed moving from lying on your back to sitting on the side of a flat bed without using bedrails?: A Coulston Help needed moving to and from a bed to a chair (including a wheelchair)?: A Cortopassi Help needed standing up from a chair using your arms (e.g., wheelchair or bedside chair)?: A Arganbright Help needed to walk in hospital room?: A Soules Help needed climbing 3-5 steps with a railing? : A Kernes 6 Click Score: 18    End of Session   Activity Tolerance: Patient limited by fatigue Patient left: in bed;with call bell/phone within reach;with bed alarm set Nurse Communication: Mobility status PT Visit Diagnosis: Unsteadiness on feet (R26.81);Other abnormalities of gait and mobility (R26.89);Muscle weakness (generalized) (M62.81);Difficulty in walking, not elsewhere classified (R26.2)     Time: 1610-9604 PT Time Calculation (min) (ACUTE ONLY): 37 min  Charges:    $Therapeutic Activity: 23-37 mins PT General Charges $$ ACUTE PT VISIT: 1 Visit                    Ozie Bo, PT, MPT    Erlene Hawks 01/05/2024, 2:36 PM

## 2024-01-05 NOTE — Progress Notes (Signed)
 PROGRESS NOTE  Anthony A Stroh Jr.  DOB: Feb 27, 1956  PCP: Pcp, No GEX:[id number]  DOA: 12/07/2023  LOS: 28 days  Hospital Day: 30  Brief narrative: Anthony A Cameron [name] is a 67 y.o. male with PMH significant for homelessness, chronic alcoholism c/b withdrawals/DTs, chronic smoking, HTN, paroxysmal A-fib, h/o PE previously on Eliquis , hiatal hernia s/p Nissen fundoplication 2016, chronic low back pain, chronic anxiety. Recently hospitalized for opiate withdrawal treatment 5/20, patient presented to the ED for confusion, low blood sugar level  Admitted to [facility]  During this hospitalization, he has also been seen by critical care, CHF team, GI, neurology, psychiatry and palliative care.  While waiting for bed placement patient became intermittently agitated requiring Ativan  per CIWA protocol, transferred to [facility] for intermittent agitation.  Patient was placed on Precedex , Librium , clonazepam .   Patient was seen by GI, started on stress dose steroids.  Patient was also seen by advanced heart failure team for biventricular heart failure.   He was transferred to [facility] service for further management of hypoglycemia, hyponatremia.  6/12, his Cortrak removed.  Able to eat, sugars better. Sodium stable.  Per psychiatry consultation, patient lacks decision-making capacity, APS involved for placement.  Subjective: The patient was seen and examined this morning, stable laying in bed not confused, in no distress Hemodynamically stable  Assessment and plan: # SIRS with hypotension 01/05/2024-resolved, hemodynamically stable  6/14, had episodes of fever up to 102.2 with drop in blood pressure WBC count normal, procalcitonin level not elevated.  Lactic acid level was elevated 2.2 . Blood culture sent.  Urinalysis normal Started on empiric IV Zosyn  and IV vancomycin  On exam, does not look toxic. Blood pressure improved with fluid bolus No recurrence of fever in the last 48 hours.   Complete 5-day course of  antibiotics with IV Zosyn -will be discontinued today 01/05/2024 No history of MRSA infection.  Can stop IV vancomycin  today. Recent Labs  Lab 12/31/23 0239 01/02/24 0008 01/02/24 1227 01/02/24 1554 01/02/24 1852 01/03/24 1252 01/04/24 0531 01/05/24 0339  WBC 4.5 5.2  --   --   --  5.2 5.0 4.8  LATICACIDVEN  --   --  2.2* 0.6 1.4  --   --   --   PROCALCITON  --  <0.10  --   --   --   --   --   --    Alcoholic liver cirrhosis Alcohol  withdrawal symptoms Withdrawal symptoms resolved in the hospital with CIWA protocol and as needed Ativan .   Needed Precedex  during this hospitalization-discontinue Continue folate, multivitamin, thiamine .   Hospital delirium- resolved-mentation back to baseline MRI brain showed advanced atrophy, Treated for possible wernicke. May also had  hepatic encephalopathy as below. Mental status improving and stable now.  6/15, psychiatry sedated, patient lacks decision-making capacity.    Acute blood loss anemia /  Retroperitoneal hematoma/ Chronic iron  deficiency anemia Hemoglobin dropped to the lowest of 6 on 5/29.   Diagnosed to have retroperitoneal hematoma.   Received total of 5 units of PRBC Ferritin level low at 9 in May 2025.  Iron  deficiency is likely to chronic blood loss related to liver cirrhosis Eliquis  has subsequently been resumed. No active bleeding right now.  Continue to monitor hemoglobin    Latest Ref Rng & Units 01/05/2024    3:39 AM 01/04/2024    5:31 AM 01/03/2024   12:52 PM  CBC  WBC 4.0 - 10.5 K/uL 4.8  5.0  5.2   Hemoglobin 13.0 - 17.0 g/dL 9.3  8.8  9.2   Hematocrit 39.0 - 52.0 % 29.2  27.9  28.7   Platelets 150 - 400 K/uL 371  386  404    On amiodarone  oral 6/14, patient was in RVR likely due to fever.   Cardizem  drip was added and later stopped.   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  Not on any antihypertensives or GDMT.  H/o PE - Eliquis  plan as  above   MDD -hold brexiprazole   Hyponatremia Na stable 127-131 Hold brexiprazole, aldactone . Encourage oral fluids, continue salt tabs 1 g 3 times daily. Recent Labs  Lab 01/02/24 0008 01/03/24 1252 01/04/24 0531 01/05/24 0339  NA 129* 135 132* 130*   Hypomagnesemia Magnesium  level persistently low.  On oral and IV replacement -Continue oral magnesium  supplements Recent Labs  Lab 01/02/24 0008 01/02/24 1227 01/03/24 1252 01/04/24 0531 01/05/24 0339  K 4.2  --  4.1 4.0 4.2  MG 1.3* 1.5* 1.5* 1.3* 1.6*   Moderate malnutrition Eating fair. Cortrak removed 12/30/23. Encourage oral feeds, supplements. Wishes to eat soft moist food, advised RN to change order.   Homelessness -disposition TOC working on safe discharge plan-patient deemed stable to be discharged as of 01/05/2024  APS involved.  6/15, per psychiatry, patient lacks capacity for decision-making   Palliative on board for goals of care discussion. Case management involved for placement      Goals of care   Code Status: Limited: Do not attempt resuscitation (DNR) -DNR-LIMITED -Do Not Intubate/DNI      DVT prophylaxis:   apixaban  (ELIQUIS ) tablet 5 mg   Antimicrobials: None Fluid: None Consultants: Psychiatry, palliative care Family Communication: None at bedside.   6/16, ID called and spoke to patient's brother Anthony [name].  He was upset because of the repeated calls he is getting regarding the patient.  He repeated again that he does not want to make any decision for Anthony [name].  I asked him if patient sister Anthony [name] would be willing to and he said she is also in the same page as he is and trying to talk to her about the same thing would not serve any benefit.  I am not aware of what dynamics there is between the siblings.  But at least at this time, they are not willing to make a decision for Anthony [name]. Palliative care consult following.  Status: Inpatient Level of care:  Progressive   Patient is from: Homeless Needs  to continue in-hospital care: Disposition challenge Anticipated d/c to: Unclear at this time   Diet:  Diet Order             DIET DYS 3 Room service appropriate? Yes; Fluid consistency: Thin  Diet effective now                   Scheduled Meds:  (feeding supplement) PROSource Plus  30 mL Oral BID BM   sodium chloride    Intravenous Once   amiodarone   200 mg Oral BID   apixaban   5 mg Oral BID   feeding supplement  237 mL Oral TID BM   folic acid   1 mg Oral Daily   Gerhardt's butt cream   Topical BID   lidocaine   1 patch Transdermal Q24H   loratadine   10 mg Oral Daily   magnesium  oxide  400 mg Oral Daily   multivitamin with minerals  1 tablet Oral Daily   mouth rinse  15 mL  Mouth Rinse 4 times per day   pantoprazole   40 mg Oral Daily   senna-docusate  1 tablet Oral QHS   sodium chloride  flush  10-40 mL Intracatheter Q12H   sodium chloride   1 g Oral TID WC   thiamine   100 mg Oral Daily    PRN meds: acetaminophen , fentaNYL  (SUBLIMAZE ) injection, metoprolol  tartrate, naphazoline-glycerin, OLANZapine  zydis, ondansetron  (ZOFRAN ) IV, polyethylene glycol, sodium chloride  flush   Infusions:   piperacillin -tazobactam (ZOSYN )  IV 3.375 g (01/05/24 0554)    Antimicrobials: Anti-infectives (From admission, onward)    Start     Dose/Rate Route Frequency Ordered Stop   01/03/24 0000  vancomycin  (VANCOREADY) IVPB 750 mg/150 mL  Status:  Discontinued        750 mg 150 mL/hr over 60 Minutes Intravenous Every 12 hours 01/02/24 1001 01/04/24 1135   01/02/24 1100  vancomycin  (VANCOREADY) IVPB 1500 mg/300 mL        1,500 mg 150 mL/hr over 120 Minutes Intravenous  Once 01/02/24 1000 01/02/24 1325   01/02/24 0400  piperacillin -tazobactam (ZOSYN ) IVPB 3.375 g        3.375 g 12.5 mL/hr over 240 Minutes Intravenous Every 8 hours 01/02/24 0231 01/07/24 0559   12/09/23 1100  metroNIDAZOLE  (FLAGYL ) IVPB 500 mg  Status:  Discontinued        500 mg 100 mL/hr over 60 Minutes Intravenous Every  12 hours 12/09/23 1004 12/14/23 1044   12/09/23 1000  cefTRIAXone  (ROCEPHIN ) 2 g in sodium chloride  0.9 % 100 mL IVPB  Status:  Discontinued        2 g 200 mL/hr over 30 Minutes Intravenous Every 24 hours 12/09/23 0908 12/14/23 1044       Objective: Vitals:   01/05/24 0409 01/05/24 0734  BP: 97/71 102/75  Pulse: 100 98  Resp: 20 16  Temp: 98.3 F (36.8 C) 98.1 F (36.7 C)  SpO2: 98% 99%    Intake/Output Summary (Last 24 hours) at 01/05/2024 1116 Last data filed at 01/05/2024 0949 Gross per 24 hour  Intake 2860.55 ml  Output 1450 ml  Net 1410.55 ml   Filed Weights   01/03/24 0500 01/04/24 0554 01/05/24 0409  Weight: 68.8 kg 66.5 kg 67.2 kg   Weight change: 0.678 kg Body mass index is 23.9 kg/m.   Physical Exam:      General:  AAO x 3,  cooperative, no distress;   HEENT:  Normocephalic, PERRL, otherwise with in Normal limits   Neuro:  CNII-XII intact. , normal motor and sensation, reflexes intact   Lungs:   Clear to auscultation BL, Respirations unlabored,  No wheezes / crackles  Cardio:    S1/S2, RRR, No murmure, No Rubs or Gallops   Abdomen:  Soft, non-tender, bowel sounds active all four quadrants, no guarding or peritoneal signs.  Muscular  skeletal:  Limited exam -global generalized weaknesses - in bed, able to move all 4 extremities,   2+ pulses,  symmetric, No pitting edema  Skin:  Dry, warm to touch, negative for any Rashes,  Wounds: Please see nursing documentation  Pressure Injury 12/16/23 Tibial Distal;Left;Posterior Stage 3 -  Full thickness tissue loss. Subcutaneous fat may be visible but bone, tendon or muscle are NOT exposed. this is NOT a pressure injury this is a full thickness wound (Active)  12/16/23 0800  Location: Tibial  Location Orientation: Distal;Left;Posterior  Staging: Stage 3 -  Full thickness tissue loss. Subcutaneous fat may be visible but bone, tendon or muscle are NOT exposed.  Wound  Description (Comments): this is NOT a pressure  injury this is a full thickness wound  DO NOT USE:  Present on Admission: No  Dressing Type Foam - Lift dressing to assess site every shift 01/04/24 2045         Data Review: I have personally reviewed the laboratory data and studies available.  F/u labs ordered Unresulted Labs (From admission, onward)     Start     Ordered   01/03/24 1107  Basic metabolic panel with GFR  Daily,   R     Question:  Specimen collection method  Answer:  Lab=Lab collect   01/03/24 1106   01/03/24 1107  Magnesium   Daily,   R     Question:  Specimen collection method  Answer:  Lab=Lab collect   01/03/24 1106   01/03/24 1107  CBC  Daily,   R     Question:  Specimen collection method  Answer:  Lab=Lab collect   01/03/24 1106            Signed, Bobbetta Burnet, MD [facility] 01/05/2024

## 2024-01-06 DIAGNOSIS — E162 Hypoglycemia, unspecified: Secondary | ICD-10-CM | POA: Diagnosis not present

## 2024-01-06 LAB — CBC
HCT: 32.1 % — ABNORMAL LOW (ref 39.0–52.0)
Hemoglobin: 10.3 g/dL — ABNORMAL LOW (ref 13.0–17.0)
MCH: 30.7 pg (ref 26.0–34.0)
MCHC: 32.1 g/dL (ref 30.0–36.0)
MCV: 95.5 fL (ref 80.0–100.0)
Platelets: 363 10*3/uL (ref 150–400)
RBC: 3.36 MIL/uL — ABNORMAL LOW (ref 4.22–5.81)
RDW: 19.7 % — ABNORMAL HIGH (ref 11.5–15.5)
WBC: 4.7 10*3/uL (ref 4.0–10.5)
nRBC: 0 % (ref 0.0–0.2)

## 2024-01-06 LAB — BASIC METABOLIC PANEL WITH GFR
Anion gap: 8 (ref 5–15)
BUN: 20 mg/dL (ref 8–23)
CO2: 24 mmol/L (ref 22–32)
Calcium: 9.1 mg/dL (ref 8.9–10.3)
Chloride: 98 mmol/L (ref 98–111)
Creatinine, Ser: 0.62 mg/dL (ref 0.61–1.24)
GFR, Estimated: >60 mL/min (ref >60)
Glucose, Bld: 88 mg/dL (ref 70–99)
Potassium: 4.4 mmol/L (ref 3.5–5.1)
Sodium: 130 mmol/L — ABNORMAL LOW (ref 135–145)

## 2024-01-06 LAB — GLUCOSE, CAPILLARY
Glucose-Capillary: 82 mg/dL (ref 70–99)
Glucose-Capillary: 70 mg/dL (ref 70–99)
Glucose-Capillary: 103 mg/dL — ABNORMAL HIGH (ref 70–99)

## 2024-01-06 LAB — MAGNESIUM: Magnesium: 1.6 mg/dL — ABNORMAL LOW (ref 1.7–2.4)

## 2024-01-06 MED ORDER — MAGNESIUM OXIDE -MG SUPPLEMENT 400 (240 MG) MG PO TABS
400.0000 mg | ORAL_TABLET | Freq: Two times a day (BID) | ORAL | Status: DC
  Administered 2024-01-06 – 2024-01-07 (×4): 400 mg via ORAL
  Filled 2024-01-06 (×4): qty 1

## 2024-01-06 MED ORDER — MAGNESIUM SULFATE 4 GM/100ML IV SOLN
4.0000 g | Freq: Once | INTRAVENOUS | Status: AC
  Administered 2024-01-06: 4 g via INTRAVENOUS
  Filled 2024-01-06: qty 100

## 2024-01-06 NOTE — Progress Notes (Signed)
 Occupational Therapy Treatment Patient Details Name: Anthony A Joslyn Jr. MRN: [id number] DOB: 08/27/1955 Today's Date: 01/06/2024   History of present illness Pt is a 68 y.o. male who presented 12/06/23 with SOB. Shortly after arriving, pt became agitated and was given IV Ativan . CT head is unremarkable. Patient admitted for EtOH withdrawal, acute delirium, and hypoglycemia. Cortrak placed 5/23.  The day before the patient had presented to ED requesting EtOH detox; patient was deemed to be not actively in EtOH withdrawal and was discharged, however upon discharge patient threatened staff and was escorted off of the premises by [employer]. PMH includes alcohol  abuse, HTN, CVA, HFmrEF, afib, PE, HLD, anxiety, depression, chronic low back pain.   OT comments  Patient continues to make good gains with OT treatment with bed mobility, transfers, and LB dressing. Patient will benefit from continued inpatient follow up therapy, <3 hours/day. Acute OT to continue to follow to address established goals to facilitate DC to next venue of care.        If plan is discharge home, recommend the following:  Assist for transportation;Direct supervision/assist for medications management;A [place] help with walking and/or transfers;A [place] help with bathing/dressing/bathroom   Equipment Recommendations  Other (comment) (defer to next venue of care)    Recommendations for Other Services      Precautions / Restrictions Precautions Precautions: Fall Recall of Precautions/Restrictions: Intact Precaution/Restrictions Comments: watch SpO2 Restrictions Weight Bearing Restrictions Per Provider Order: No       Mobility Bed Mobility Overal bed mobility: Needs Assistance Bed Mobility: Sidelying to Sit     Supine to sit: Contact guard, Used rails     General bed mobility comments: increased time and use of bed rails    Transfers Overall transfer level: Needs assistance Equipment used: Rolling walker (2  wheels) Transfers: Sit to/from Stand, Bed to chair/wheelchair/BSC Sit to Stand: Contact guard assist     Step pivot transfers: Contact guard assist     General transfer comment: cues for safety and hand placement     Balance Overall balance assessment: Needs assistance Sitting-balance support: No upper extremity supported, Feet supported Sitting balance-Leahy Scale: Good     Standing balance support: Single extremity supported Standing balance-Leahy Scale: Poor Standing balance comment: reliant on external support for balance                           ADL either performed or assessed with clinical judgement   ADL Overall ADL's : Needs assistance/impaired Eating/Feeding: Set up;Sitting   Grooming: Wash/dry hands;Wash/dry face;Contact guard assist;Standing               Lower Body Dressing: Moderate assistance;Sitting/lateral leans Lower Body Dressing Details (indicate cue type and reason): required assistance with right sock Toilet Transfer: Contact guard assist;Rolling walker (2 wheels)           Functional mobility during ADLs: Contact guard assist      Extremity/Trunk Assessment              Vision       Perception     Praxis     Communication Communication Communication: No apparent difficulties   Cognition Arousal: Alert Behavior During Therapy: WFL for tasks assessed/performed Cognition: Cognition impaired     Awareness: Online awareness intact Memory impairment (select all impairments): Short-term memory, Working memory Attention impairment (select first level of impairment): Sustained attention Executive functioning impairment (select all impairments): Reasoning  Following commands: Impaired Following commands impaired: Follows one step commands with increased time      Cueing   Cueing Techniques: Verbal cues  Exercises      Shoulder Instructions       General Comments 100% SpO2 on RA     Pertinent Vitals/ Pain       Pain Assessment Pain Assessment: Faces Faces Pain Scale: Hurts a Countess bit Pain Location: chronic back pain Pain Descriptors / Indicators: Sore Pain Intervention(s): Monitored during session, Repositioned  Home Living                                          Prior Functioning/Environment              Frequency  Min 2X/week        Progress Toward Goals  OT Goals(current goals can now be found in the care plan section)  Progress towards OT goals: Progressing toward goals  Acute Rehab OT Goals Patient Stated Goal: to have a safe discharge plan OT Goal Formulation: With patient Time For Goal Achievement: 01/17/24 Potential to Achieve Goals: Good ADL Goals Pt Will Perform Grooming: with modified independence;standing Pt Will Perform Lower Body Dressing: with modified independence;sit to/from stand Pt Will Transfer to Toilet: regular height toilet;ambulating;with modified independence  Plan      Co-evaluation                 AM-PAC OT 6 Clicks Daily Activity     Outcome Measure   Help from another person eating meals?: None Help from another person taking care of personal grooming?: A Aguon Help from another person toileting, which includes using toliet, bedpan, or urinal?: A Carll Help from another person bathing (including washing, rinsing, drying)?: A Fennelly Help from another person to put on and taking off regular upper body clothing?: A Scharnhorst Help from another person to put on and taking off regular lower body clothing?: A Mckim 6 Click Score: 19    End of Session Equipment Utilized During Treatment: Rolling walker (2 wheels)  OT Visit Diagnosis: Unsteadiness on feet (R26.81);Muscle weakness (generalized) (M62.81);Other abnormalities of gait and mobility (R26.89)   Activity Tolerance Patient tolerated treatment well   Patient Left in chair;with call bell/phone within reach;with chair alarm  set   Nurse Communication Mobility status        Time: [phone] OT Time Calculation (min): 26 min  Charges: OT General Charges $OT Visit: 1 Visit OT Treatments $Self Care/Home Management : 23-37 mins  Anitra Barn, OTA Acute Rehabilitation Services  Office [phone]   Jovita Nipper 01/06/2024, 9:05 AM

## 2024-01-06 NOTE — Progress Notes (Signed)
 PROGRESS NOTE  Anthony A Hamidi Jr.  DOB: 09/14/1955  PCP: Pcp, No ZOX:[id number]  DOA: 12/07/2023  LOS: 29 days  Hospital Day: 31  Brief narrative: Anthony [name]. is a 68 y.o. male with PMH significant for homelessness, chronic alcoholism c/b withdrawals/DTs, chronic smoking, HTN, paroxysmal A-fib, h/o PE previously on Eliquis , hiatal hernia s/p Nissen fundoplication 2016, chronic low back pain, chronic anxiety. Recently hospitalized for opiate withdrawal treatment 5/20, patient presented to the ED for confusion, low blood sugar level  Admitted to [facility]  During this hospitalization, he has also been seen by critical care, CHF team, GI, neurology, psychiatry and palliative care.  While waiting for bed placement patient became intermittently agitated requiring Ativan  per CIWA protocol, transferred to [facility] for intermittent agitation.  Patient was placed on Precedex , Librium , clonazepam .   Patient was seen by GI, started on stress dose steroids.  Patient was also seen by advanced heart failure team for biventricular heart failure.   He was transferred to [facility] service for further management of hypoglycemia, hyponatremia.  6/12, his Cortrak removed.  Able to eat, sugars better. Sodium stable.  Per psychiatry consultation, patient lacks decision-making capacity, APS involved for placement.  Subjective:  The patient was examined this morning, sitting up in distress Hemodynamically stable    Pending APS evaluation, pending discharge TOC/social worker assistance    assessment and plan: # SIRS with hypotension Resolved-hemodynamically stable  6/14, had episodes of fever up to 102.2 with drop in blood pressure WBC count normal, procalcitonin level not elevated.  Lactic acid level was elevated 2.2 . Blood culture sent.  Urinalysis normal Started on empiric IV Zosyn  and IV vancomycin  On exam, does not look toxic.  Complete 5-day course of antibiotics with IV Zosyn -will be discontinued.    No history of MRSA infection.  Can stop IV vancomycin  today. Recent Labs  Lab 01/02/24 0008 01/02/24 1227 01/02/24 1554 01/02/24 1852 01/03/24 1252 01/04/24 0531 01/05/24 0339 01/06/24 0354  WBC 5.2  --   --   --  5.2 5.0 4.8 4.7  LATICACIDVEN  --  2.2* 0.6 1.4  --   --   --   --   PROCALCITON <0.10  --   --   --   --   --   --   --    Alcoholic liver cirrhosis Alcohol  withdrawal symptoms Withdrawal symptoms resolved in the hospital with CIWA protocol and as needed Ativan .   Needed Precedex  during this hospitalization-discontinue Continue folate, multivitamin, thiamine .   Hospital delirium- resolved-mentation back to baseline MRI brain showed advanced atrophy, Treated for possible wernicke. May also had  hepatic encephalopathy as below. Mental status improving and stable now.  6/15, psychiatry sedated, patient lacks decision-making capacity.    Acute blood loss anemia /  Retroperitoneal hematoma/ Chronic iron  deficiency anemia Stable now  Hemoglobin dropped to the lowest of 6 on 5/29.   Diagnosed to have retroperitoneal hematoma.   Received total of 5 units of PRBC Ferritin level low at 9 in May 2025.  Iron  deficiency is likely to chronic blood loss related to liver cirrhosis Eliquis  has subsequently been resumed. No active bleeding right now.  Continue to monitor hemoglobin    Latest Ref Rng & Units 01/06/2024    3:54 AM 01/05/2024    3:39 AM 01/04/2024    5:31 AM  CBC  WBC 4.0 - 10.5 K/uL 4.7  4.8  5.0   Hemoglobin 13.0 - 17.0 g/dL 81.1  9.3  8.8  Hematocrit 39.0 - 52.0 % 32.1  29.2  27.9   Platelets 150 - 400 K/uL 363  371  386    On amiodarone  oral 6/14, patient was in RVR likely due to fever.   Cardizem  drip was added and later stopped.   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  Not on any antihypertensives or GDMT.  H/o PE - Eliquis  plan as above   MDD -hold brexiprazole    Hyponatremia Na stable 127-131 Hold brexiprazole, aldactone . Encourage oral fluids, continue salt tabs 1 g 3 times daily. Recent Labs  Lab 01/02/24 0008 01/03/24 1252 01/04/24 0531 01/05/24 0339 01/06/24 0354  NA 129* 135 132* 130* 130*   Hypomagnesemia Magnesium  level persistently low.  On oral and IV replacement -Continue oral magnesium  supplements Recent Labs  Lab 01/02/24 0008 01/02/24 1227 01/03/24 1252 01/04/24 0531 01/05/24 0339 01/06/24 0354  K 4.2  --  4.1 4.0 4.2 4.4  MG 1.3* 1.5* 1.5* 1.3* 1.6* 1.6*   Moderate malnutrition Body mass index is 23.37 kg/m. Eating fair. Cortrak removed 12/30/23. Encourage oral feeds, supplements. Wishes to eat soft moist food, advised RN to change order.   Homelessness -disposition TOC working on safe discharge plan-patient deemed stable to be discharged as of 01/05/2024  APS involved.  6/15, per psychiatry, patient lacks capacity for decision-making   Palliative on board for goals of care discussion. Case management involved for placement      Goals of care   Code Status: Limited: Do not attempt resuscitation (DNR) -DNR-LIMITED -Do Not Intubate/DNI      DVT prophylaxis:   apixaban  (ELIQUIS ) tablet 5 mg   Antimicrobials: None Fluid: None Consultants: Psychiatry, palliative care Family Communication: None at bedside.   6/16, ID called and spoke to patient's brother [name].  He was upset because of the repeated calls he is getting regarding the patient.  He repeated again that he does not want to make any decision for Anthony [name].  I asked him if patient sister [name] would be willing to and he said she is also in the same page as he is and trying to talk to her about the same thing would not serve any benefit.  I am not aware of what dynamics there is between the siblings.  But at least at this time, they are not willing to make a decision for Anthony [name]. Palliative care consult following.  Status: Inpatient Level of care:   Progressive   Patient is from: Homeless Needs to continue in-hospital care: Disposition challenge Anticipated d/c to: Unclear at this time   Diet:  Diet Order             Diet regular Room service appropriate? Yes; Fluid consistency: Thin  Diet effective now                   Scheduled Meds:  sodium chloride    Intravenous Once   amiodarone   200 mg Oral BID   apixaban   5 mg Oral BID   feeding supplement  237 mL Oral TID BM   Gerhardt's butt cream   Topical BID   lidocaine   1 patch Transdermal Q24H   loratadine   10 mg Oral Daily   magnesium  oxide  400 mg Oral BID   multivitamin with minerals  1 tablet Oral Daily   mouth rinse  15 mL Mouth Rinse 4 times per day   pantoprazole   40 mg Oral Daily  senna-docusate  1 tablet Oral QHS   sodium chloride  flush  10-40 mL Intracatheter Q12H   sodium chloride   1 g Oral TID WC    PRN meds: acetaminophen , metoprolol  tartrate, naphazoline-glycerin, OLANZapine  zydis, ondansetron  (ZOFRAN ) IV, polyethylene glycol   Infusions:     Antimicrobials: Anti-infectives (From admission, onward)    Start     Dose/Rate Route Frequency Ordered Stop   01/03/24 0000  vancomycin  (VANCOREADY) IVPB 750 mg/150 mL  Status:  Discontinued        750 mg 150 mL/hr over 60 Minutes Intravenous Every 12 hours 01/02/24 1001 01/04/24 1135   01/02/24 1100  vancomycin  (VANCOREADY) IVPB 1500 mg/300 mL        1,500 mg 150 mL/hr over 120 Minutes Intravenous  Once 01/02/24 1000 01/02/24 1325   01/02/24 0400  piperacillin -tazobactam (ZOSYN ) IVPB 3.375 g  Status:  Discontinued        3.375 g 12.5 mL/hr over 240 Minutes Intravenous Every 8 hours 01/02/24 0231 01/06/24 1358   12/09/23 1100  metroNIDAZOLE  (FLAGYL ) IVPB 500 mg  Status:  Discontinued        500 mg 100 mL/hr over 60 Minutes Intravenous Every 12 hours 12/09/23 1004 12/14/23 1044   12/09/23 1000  cefTRIAXone  (ROCEPHIN ) 2 g in sodium chloride  0.9 % 100 mL IVPB  Status:  Discontinued        2 g 200  mL/hr over 30 Minutes Intravenous Every 24 hours 12/09/23 0908 12/14/23 1044       Objective: Vitals:   01/06/24 0849 01/06/24 1125  BP: 105/81 111/84  Pulse: (!) 117 99  Resp: 16 20  Temp: 98.2 F (36.8 C) 98.2 F (36.8 C)  SpO2: 98% 100%    Intake/Output Summary (Last 24 hours) at 01/06/2024 1401 Last data filed at 01/06/2024 1217 Gross per 24 hour  Intake 730.04 ml  Output 1730 ml  Net -999.96 ml   Filed Weights   01/04/24 0554 01/05/24 0409 01/06/24 0347  Weight: 66.5 kg 67.2 kg 65.7 kg   Weight change: -1.497 kg Body mass index is 23.37 kg/m.   Physical Exam:        General:  AAO x 3,  cooperative, no distress;   HEENT:  Normocephalic, PERRL, otherwise with in Normal limits   Neuro:  CNII-XII intact. , normal motor and sensation, reflexes intact   Lungs:   Clear to auscultation BL, Respirations unlabored,  No wheezes / crackles  Cardio:    S1/S2, RRR, No murmure, No Rubs or Gallops   Abdomen:  Soft, non-tender, bowel sounds active all four quadrants, no guarding or peritoneal signs.  Muscular  skeletal:  Limited exam -global generalized weaknesses - in bed, able to move all 4 extremities,   2+ pulses,  symmetric, No pitting edema  Skin:  Dry, warm to touch, negative for any Rashes,  Wounds: Please see nursing documentation  Pressure Injury 12/16/23 Tibial Distal;Left;Posterior Stage 3 -  Full thickness tissue loss. Subcutaneous fat may be visible but bone, tendon or muscle are NOT exposed. this is NOT a pressure injury this is a full thickness wound (Active)  12/16/23 0800  Location: Tibial  Location Orientation: Distal;Left;Posterior  Staging: Stage 3 -  Full thickness tissue loss. Subcutaneous fat may be visible but bone, tendon or muscle are NOT exposed.  Wound Description (Comments): this is NOT a pressure injury this is a full thickness wound  DO NOT USE:  Present on Admission: No  Dressing Type Foam - Lift dressing to assess  site every shift  01/05/24 2130            Data Review: I have personally reviewed the laboratory data and studies available.  F/u labs ordered Unresulted Labs (From admission, onward)     Start     Ordered   01/03/24 1107  Basic metabolic panel with GFR  Daily,   R     Question:  Specimen collection method  Answer:  Lab=Lab collect   01/03/24 1106   01/03/24 1107  Magnesium   Daily,   R     Question:  Specimen collection method  Answer:  Lab=Lab collect   01/03/24 1106   01/03/24 1107  CBC  Daily,   R     Question:  Specimen collection method  Answer:  Lab=Lab collect   01/03/24 1106            Signed, Bobbetta Burnet, MD [facility] 01/06/2024

## 2024-01-06 NOTE — TOC Progression Note (Addendum)
 Transition of Care (TOC) - Progression Note    Patient Details  Name: Anthony A Rimel Jr. MRN: [id number] Date of Birth: September 07, 1955  Transition of Care [facility]) CM/SW Contact  Carmon Christen, LCSWA Phone Number: 01/06/2024, 2:39 PM  Clinical Narrative:     CSW LVM for Josefina Nian with APS. CSW also called APS and LVM. CSW awaiting call back.  Update- Harrington Limes with APS gave CSW a call back. Shelly informed CSW that case worker Josefina Nian will be out until Tuesday .Shelly confirmed there is an open case. Shelly informed CSW she  is going to have Child psychotherapist follow up with CSW Adah Acron tomorrow with update on case. All questions answered. No further questions reported at this time.  Expected Discharge Plan: Skilled Nursing Facility Barriers to Discharge: Continued Medical Work up  Expected Discharge Plan and Services In-house Referral: Clinical Social Work     Living arrangements for the past 2 months: Homeless                                       Social Determinants of Health (SDOH) Interventions SDOH Screenings   Food Insecurity: Patient Unable To Answer (12/09/2023)  Housing: Patient Unable To Answer (12/09/2023)  Transportation Needs: Patient Unable To Answer (12/09/2023)  Utilities: Patient Unable To Answer (12/09/2023)  Depression (PHQ2-9): Medium Risk (10/16/2020)  Social Connections: Unknown (12/22/2023)  Tobacco Use: High Risk (12/08/2023)    Readmission Risk Interventions    08/27/2023   12:21 PM 08/19/2023    9:17 AM 07/12/2023    9:58 AM  Readmission Risk Prevention Plan  Transportation Screening Complete Complete Complete  Medication Review Oceanographer) Complete Complete Complete  PCP or Specialist appointment within 3-5 days of discharge Complete  --  HRI or Home Care Consult Complete Complete Complete  SW Recovery Care/Counseling Consult Complete Complete Complete  Palliative Care Screening Not Applicable Not Applicable Not Applicable  Skilled Nursing  Facility Not Applicable Not Applicable Not Applicable

## 2024-01-06 NOTE — Progress Notes (Signed)
 Mobility Specialist Progress Note;   01/06/24 0945  Mobility  Activity Transferred from chair to bed  Level of Assistance Contact guard assist, steadying assist  Assistive Device None  Distance Ambulated (ft) 5 ft  Activity Response Tolerated well  Mobility Referral Yes  Mobility visit 1 Mobility  Mobility Specialist Start Time (ACUTE ONLY) 1011  Mobility Specialist Stop Time (ACUTE ONLY) 1018  Mobility Specialist Time Calculation (min) (ACUTE ONLY) 7 min   Pt requesting assistance back to bed. Required MinG assistance during ambulation for safety. HR up to 123 bpm w/ activity. Pt left comfortably in bed with all needs met, alarm on.   Janit Meline Mobility Specialist Please contact via SecureChat or [employer] [phone]

## 2024-01-07 DIAGNOSIS — E162 Hypoglycemia, unspecified: Secondary | ICD-10-CM | POA: Diagnosis not present

## 2024-01-07 LAB — GLUCOSE, CAPILLARY
Glucose-Capillary: 83 mg/dL (ref 70–99)
Glucose-Capillary: 76 mg/dL (ref 70–99)
Glucose-Capillary: 102 mg/dL — ABNORMAL HIGH (ref 70–99)

## 2024-01-07 LAB — CBC
HCT: 32.4 % — ABNORMAL LOW (ref 39.0–52.0)
Hemoglobin: 10.4 g/dL — ABNORMAL LOW (ref 13.0–17.0)
MCH: 30.9 pg (ref 26.0–34.0)
MCHC: 32.1 g/dL (ref 30.0–36.0)
MCV: 96.1 fL (ref 80.0–100.0)
Platelets: 334 10*3/uL (ref 150–400)
RBC: 3.37 MIL/uL — ABNORMAL LOW (ref 4.22–5.81)
RDW: 19.7 % — ABNORMAL HIGH (ref 11.5–15.5)
WBC: 5.3 10*3/uL (ref 4.0–10.5)
nRBC: 0 % (ref 0.0–0.2)

## 2024-01-07 LAB — CULTURE, BLOOD (ROUTINE X 2)
Culture: NO GROWTH
Culture: NO GROWTH

## 2024-01-07 LAB — BASIC METABOLIC PANEL WITH GFR
Anion gap: 11 (ref 5–15)
BUN: 18 mg/dL (ref 8–23)
CO2: 22 mmol/L (ref 22–32)
Calcium: 9 mg/dL (ref 8.9–10.3)
Chloride: 97 mmol/L — ABNORMAL LOW (ref 98–111)
Creatinine, Ser: 0.61 mg/dL (ref 0.61–1.24)
GFR, Estimated: >60 mL/min (ref >60)
Glucose, Bld: 96 mg/dL (ref 70–99)
Potassium: 4.2 mmol/L (ref 3.5–5.1)
Sodium: 130 mmol/L — ABNORMAL LOW (ref 135–145)

## 2024-01-07 LAB — MAGNESIUM: Magnesium: 1.8 mg/dL (ref 1.7–2.4)

## 2024-01-07 NOTE — Plan of Care (Signed)

## 2024-01-07 NOTE — Progress Notes (Signed)
 PT Cancellation Note  Patient Details Name: Anthony A Luck Jr. MRN: [id number] DOB: 12-12-1955   Cancelled Treatment:    Reason Eval/Treat Not Completed: Patient declined, no reason specified (Pt refused PT treatment. Educated pt on the importance of mobilizing to prevent deconditioning and help him regain his PLOF. Pt continued to decline and reported I am choosing to not participate as I am not going to help you get me out of the hospital sooner. I want to stay here.) PT will continue to follow acutely and follow-up as schedule permits.   Glenford Lanes, PT, DPT Acute Rehabilitation Services Office: [phone] Secure Chat Preferred  Riva Chester 01/07/2024, 3:19 PM

## 2024-01-07 NOTE — Progress Notes (Signed)
 Mobility Specialist Progress Note;   01/07/24 0912  Mobility  Activity Transferred from bed to chair  Level of Assistance Contact guard assist, steadying assist  Assistive Device None  Distance Ambulated (ft) 5 ft  Activity Response Tolerated well  Mobility Referral Yes  Mobility visit 1 Mobility  Mobility Specialist Start Time (ACUTE ONLY) 0912  Mobility Specialist Stop Time (ACUTE ONLY) 0920  Mobility Specialist Time Calculation (min) (ACUTE ONLY) 8 min   Pt agreeable to mobility. Required MinG assistance to safely transfer pt from bed to chair. HR up to 118 bpm w/ activity. No c/o when asked. Pt left in chair with all needs met, alarm on.   Janit Meline Mobility Specialist Please contact via SecureChat or [employer] [phone]

## 2024-01-07 NOTE — Progress Notes (Signed)
 PROGRESS NOTE  Anthony A Gillean Jr.  DOB: 1955/10/21  PCP: Pcp, No ZOX:[id number]  DOA: 12/07/2023  LOS: 30 days  Hospital Day: 32  Brief narrative: Anthony [name]. is a 68 y.o. male with PMH significant for homelessness, chronic alcoholism c/b withdrawals/DTs, chronic smoking, HTN, paroxysmal A-fib, h/o PE previously on Eliquis , hiatal hernia s/p Nissen fundoplication 2016, chronic low back pain, chronic anxiety. Recently hospitalized for opiate withdrawal treatment 5/20, patient presented to the ED for confusion, low blood sugar level   During this hospitalization, he has also been seen by critical care, CHF team, GI, neurology, psychiatry and palliative care.  While waiting for bed placement patient became intermittently agitated requiring Ativan  per CIWA protocol, transferred to [facility] for intermittent agitation.  Patient was placed on Precedex , Librium , clonazepam .   Patient was seen by GI, started on stress dose steroids.  Patient was also seen by advanced heart failure team for biventricular heart failure.   He was transferred to [facility] service for further management of hypoglycemia, hyponatremia.  Per psychiatry consultation, patient lacks decision-making capacity, APS involved for placement.  Subjective:  Sitting in chair, no acute distress    Pending APS evaluation, pending discharge TOC/social worker assistance    assessment and plan: SIRS with hypotension Resolved-hemodynamically stable Completed 5-day course of antibiotics with IV Zosyn -will be discontinued.    Alcoholic liver cirrhosis Alcohol  withdrawal symptoms Withdrawal symptoms resolved in the hospital with CIWA protocol and as needed Ativan .   Needed Precedex  during this hospitalization-discontinued Continue folate, multivitamin, thiamine .   Hospital delirium- resolved-mentation back to baseline MRI brain showed advanced atrophy, Treated for possible wernicke. May also had  hepatic encephalopathy as  below. 6/15, psychiatry sedated, patient lacks decision-making capacity.    Acute blood loss anemia /  Retroperitoneal hematoma/ Chronic iron  deficiency anemia Stable now  Hemoglobin dropped to the lowest of 6 on 5/29.   Diagnosed to have retroperitoneal hematoma.   Received total of 5 units of PRBC Eliquis  has subsequently been resumed. No active bleeding right now.  Continue to monitor hemoglobin- stable  A fib with RVR On amiodarone  oral 6/14, patient was in RVR likely due to fever.   Cardizem  drip was added and later stopped.   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  Not on any antihypertensives or GDMT.  H/o PE - Eliquis  plan as above   MDD -hold brexiprazole   Hyponatremia -trend -on salt tabs  Hypomagnesemia -replete  Moderate malnutrition Body mass index is 23.24 kg/m. Eating fair. Cortrak removed 12/30/23. Encourage oral feeds, supplements.    Homelessness -disposition TOC working on safe discharge plan-patient deemed stable to be discharged as of 01/05/2024  APS involved.  6/15, per psychiatry, patient lacks capacity for decision-making   Palliative on board for goals of care discussion. Case management involved for placement      Goals of care   Code Status: Limited: Do not attempt resuscitation (DNR) -DNR-LIMITED -Do Not Intubate/DNI      DVT prophylaxis:   apixaban  (ELIQUIS ) tablet 5 mg   Antimicrobials: None Fluid: None Consultants: Psychiatry, palliative care Family Communication: None at bedside.   6/16, ID called and spoke to patient's brother [name].  He was upset because of the repeated calls he is getting regarding the patient.  He repeated again that he does not want to make any decision for [place].  I asked him if patient sister [name] would be willing  to and he said she is also in the same page as he is and trying to talk to her about the same thing would not serve any  benefit.  I am not aware of what dynamics there is between the siblings.  But at least at this time, they are not willing to make a decision for [name]. Palliative care consult following.  Status: Inpatient Level of care:  Med-Surg   Patient is from: Homeless Needs to continue in-hospital care: Disposition challenge Anticipated d/c to: Unclear at this time   Scheduled Meds:  sodium chloride    Intravenous Once   amiodarone   200 mg Oral BID   apixaban   5 mg Oral BID   feeding supplement  237 mL Oral TID BM   Gerhardt's butt cream   Topical BID   lidocaine   1 patch Transdermal Q24H   loratadine   10 mg Oral Daily   magnesium  oxide  400 mg Oral BID   multivitamin with minerals  1 tablet Oral Daily   mouth rinse  15 mL Mouth Rinse 4 times per day   pantoprazole   40 mg Oral Daily   senna-docusate  1 tablet Oral QHS   sodium chloride  flush  10-40 mL Intracatheter Q12H   sodium chloride   1 g Oral TID WC    PRN meds: acetaminophen , metoprolol  tartrate, naphazoline-glycerin, OLANZapine  zydis, ondansetron  (ZOFRAN ) IV, polyethylene glycol      Antimicrobials:    Objective: Vitals:   01/07/24 0345 01/07/24 0751  BP: (!) 115/94 100/82  Pulse: 100 90  Resp: 16 16  Temp: 98.1 F (36.7 C) 98.5 F (36.9 C)  SpO2: 99%     Intake/Output Summary (Last 24 hours) at 01/07/2024 1031 Last data filed at 01/07/2024 1031 Gross per 24 hour  Intake 1037 ml  Output 2305 ml  Net -1268 ml   Filed Weights   01/05/24 0409 01/06/24 0347 01/07/24 0345  Weight: 67.2 kg 65.7 kg 65.3 kg   Weight change: -0.363 kg Body mass index is 23.24 kg/m.   Physical Exam:   General: Appearance:    Well developed, well nourished male in no acute distress- in chair     Lungs:      respirations unlabored  Heart:    Normal heart rate.   MS:   All extremities are intact.   Neurologic:   Awake, alert,     Pressure Injury 12/16/23 Tibial Distal;Left;Posterior Stage 3 -  Full thickness tissue loss.  Subcutaneous fat may be visible but bone, tendon or muscle are NOT exposed. this is NOT a pressure injury this is a full thickness wound (Active)  12/16/23 0800  Location: Tibial  Location Orientation: Distal;Left;Posterior  Staging: Stage 3 -  Full thickness tissue loss. Subcutaneous fat may be visible but bone, tendon or muscle are NOT exposed.  Wound Description (Comments): this is NOT a pressure injury this is a full thickness wound  DO NOT USE:  Present on Admission: No  Dressing Type Foam - Lift dressing to assess site every shift;Silver  hydrofiber 01/06/24 1642            Data Review: I have personally reviewed the laboratory data and studies available.  F/u labs ordered Unresulted Labs (From admission, onward)     Start     Ordered   01/03/24 1107  Magnesium   Daily,   R     Question:  Specimen collection method  Answer:  Lab=Lab collect   01/03/24 1106  Signed, Enrigue Harvard, DO [facility] 01/07/2024

## 2024-01-08 DIAGNOSIS — E162 Hypoglycemia, unspecified: Secondary | ICD-10-CM | POA: Diagnosis not present

## 2024-01-08 LAB — GLUCOSE, CAPILLARY
Glucose-Capillary: 90 mg/dL (ref 70–99)
Glucose-Capillary: 92 mg/dL (ref 70–99)
Glucose-Capillary: 99 mg/dL (ref 70–99)

## 2024-01-08 LAB — MAGNESIUM: Magnesium: 1.6 mg/dL — ABNORMAL LOW (ref 1.7–2.4)

## 2024-01-08 MED ORDER — ORAL CARE MOUTH RINSE: 15.0000 mL | OROMUCOSAL | Status: DC | PRN

## 2024-01-08 MED ORDER — MAGNESIUM OXIDE -MG SUPPLEMENT 400 (240 MG) MG PO TABS
800.0000 mg | ORAL_TABLET | Freq: Two times a day (BID) | ORAL | Status: DC
  Administered 2024-01-08 – 2024-01-14 (×14): 800 mg via ORAL
  Filled 2024-01-08 (×14): qty 2

## 2024-01-08 NOTE — Plan of Care (Signed)

## 2024-01-08 NOTE — Progress Notes (Signed)
 PROGRESS NOTE  Anthony Anthony Muckle Jr.  DOB: [birth year]  PCP: Pcp, No FMW:[id number]  DOA: 12/07/2023  LOS: 31 days  Hospital Day: 33  Brief narrative: An Anthony [name]. is Anthony 68 y.o. male with PMH significant for homelessness, chronic alcoholism c/b withdrawals/DTs, chronic smoking, HTN, paroxysmal Anthony-fib, h/o PE previously on Eliquis , hiatal hernia s/p Nissen fundoplication 2016, chronic low back pain, chronic anxiety. Recently hospitalized for opiate withdrawal treatment 5/20, patient presented to the ED for confusion, low blood sugar level   During this hospitalization, he has also been seen by critical care, CHF team, GI, neurology, psychiatry and palliative care.  During this admission , had intermittent agitation- requiring Ativan  per CIWA protocol, transferred to [facility] Patient was placed on Precedex , Librium , clonazepam ... Concluded treatment Patient was seen by GI, started on stress dose steroids.  Patient was also seen by advanced heart failure team for biventricular heart failure.   He was transferred to TRH service for further management of hypoglycemia, hyponatremia.  Per psychiatry consultation, patient lacks decision-making capacity, APS involved for placement.  Subjective: The patient was seen and examined this morning, stable no acute distress No events overnight    Pending APS evaluation, pending discharge TOC/social worker assistance    assessment and plan: SIRS with hypotension Resolved-hemodynamically stable Completed 5-day course of antibiotics with IV Zosyn -will be discontinued.    Alcoholic liver cirrhosis Alcohol  withdrawal symptoms Status post symptomatic agitation, was treated at ICU setting with Precedex ,  Continue folate, multivitamin, thiamine .   Hospital delirium- resolved-mentation back to baseline MRI brain showed advanced atrophy, Treated for possible wernicke. May also had  hepatic encephalopathy as below. 6/15, psychiatry sedated, patient lacks  decision-making capacity.    Acute blood loss anemia /  Retroperitoneal hematoma/ Chronic iron  deficiency anemia Stable now  Hemoglobin dropped to the lowest of 6 on 5/29.   Diagnosed to have retroperitoneal hematoma.   Received total of 5 units of PRBC Eliquis  has subsequently been resumed. No active bleeding right now.  Continue to monitor hemoglobin- stable  Anthony fib with RVR On amiodarone  oral 6/14, patient was in RVR likely due to fever.   Cardizem  drip was added and later stopped.   Currently continues on amiodarone  Eliquis  plan as above   HFrEF - EF 30 to 40%  Blood pressure running soft-   not on any antihypertensives or GDMT.  H/o PE - Eliquis  plan as above   MDD -hold brexiprazole   Hyponatremia -monitoring and improved, continue sodium supplements  Hypomagnesemia -persisting, continue oral mag (multiple IV fluid infusion)   Moderate malnutrition Body mass index is 23.24 kg/m. Eating fair. Cortrak removed 12/30/23. Encourage oral feeds, supplements.    Homelessness -disposition TOC working on safe discharge plan-patient deemed stable to be discharged as of 01/05/2024  APS involved.  6/15, per psychiatry, patient lacks capacity for decision-making   Palliative on board for goals of care discussion. Case management involved for placement      Goals of care   Code Status: Limited: Do not attempt resuscitation (DNR) -DNR-LIMITED -Do Not Intubate/DNI      DVT prophylaxis:   apixaban  (ELIQUIS ) tablet 5 mg   Antimicrobials: None Fluid: None Consultants: Psychiatry, palliative care Family Communication: None at bedside.   6/16, ID called and spoke to patient's brother Anthony [name].  He was upset because of the repeated calls he is getting regarding the patient.  He repeated again that he does not want to make any decision for Anthony [name].  I asked him if  patient sister Anthony [name] would be willing to and he said she is also in the same page as he is and trying to talk to her  about the same thing would not serve any benefit.  I am not aware of what dynamics there is between the siblings.  But at least at this time, they are not willing to make Anthony decision for Anthony [name]. Palliative care consult following.  Status: Inpatient Level of care:  Med-Surg   Patient is from: Homeless Needs to continue in-hospital care: Disposition challenge Anticipated d/c to: Unclear at this time   Scheduled Meds:  amiodarone   200 mg Oral BID   apixaban   5 mg Oral BID   feeding supplement  237 mL Oral TID BM   Gerhardt's butt cream   Topical BID   lidocaine   1 patch Transdermal Q24H   loratadine   10 mg Oral Daily   magnesium  oxide  800 mg Oral BID   multivitamin with minerals  1 tablet Oral Daily   mouth rinse  15 mL Mouth Rinse 4 times per day   pantoprazole   40 mg Oral Daily   senna-docusate  1 tablet Oral QHS   sodium chloride  flush  10-40 mL Intracatheter Q12H   sodium chloride   1 g Oral TID WC    PRN meds: acetaminophen , metoprolol  tartrate, naphazoline-glycerin , OLANZapine  zydis, ondansetron  (ZOFRAN ) IV, polyethylene glycol      Antimicrobials: Finished IV Zosyn  01/06/2024   Objective: Vitals:   01/08/24 0346 01/08/24 0755  BP: 95/68 121/75  Pulse: 100 (!) 109  Resp: 19 17  Temp: 98.5 F (36.9 C) 97.9 F (36.6 C)  SpO2: 98% 98%    Intake/Output Summary (Last 24 hours) at 01/08/2024 1151 Last data filed at 01/08/2024 0725 Gross per 24 hour  Intake 240 ml  Output 925 ml  Net -685 ml   Filed Weights   01/06/24 0347 01/07/24 0345 01/08/24 0346  Weight: 65.7 kg 65.3 kg 65.3 kg   Weight change: -0.018 kg Body mass index is 23.24 kg/m.   Physical Exam:       General:  AAO x 3,  cooperative, no distress;   HEENT:  Normocephalic, PERRL, otherwise with in Normal limits   Neuro:  CNII-XII intact. , normal motor and sensation, reflexes intact   Lungs:   Clear to auscultation BL, Respirations unlabored,  No wheezes / crackles  Cardio:    S1/S2, RRR, No  murmure, No Rubs or Gallops   Abdomen:  Soft, non-tender, bowel sounds active all four quadrants, no guarding or peritoneal signs.  Muscular  skeletal:  Limited exam -global generalized weaknesses - in bed, able to move all 4 extremities,   2+ pulses,  symmetric, No pitting edema  Skin:  Dry, warm to touch, negative for any Rashes,  Wounds: Please see nursing documentation  Pressure Injury 12/16/23 Tibial Distal;Left;Posterior Stage 3 -  Full thickness tissue loss. Subcutaneous fat may be visible but bone, tendon or muscle are NOT exposed. this is NOT Anthony pressure injury this is Anthony full thickness wound (Active)  12/16/23 0800  Location: Tibial  Location Orientation: Distal;Left;Posterior  Staging: Stage 3 -  Full thickness tissue loss. Subcutaneous fat may be visible but bone, tendon or muscle are NOT exposed.  Wound Description (Comments): this is NOT Anthony pressure injury this is Anthony full thickness wound  DO NOT USE:  Present on Admission: No  Dressing Type Foam - Lift dressing to assess site every shift 01/07/24 2007  Pressure Injury 12/16/23 Tibial Distal;Left;Posterior Stage 3 -  Full thickness tissue loss. Subcutaneous fat may be visible but bone, tendon or muscle are NOT exposed. this is NOT Anthony pressure injury this is Anthony full thickness wound (Active)  12/16/23 0800  Location: Tibial  Location Orientation: Distal;Left;Posterior  Staging: Stage 3 -  Full thickness tissue loss. Subcutaneous fat may be visible but bone, tendon or muscle are NOT exposed.  Wound Description (Comments): this is NOT Anthony pressure injury this is Anthony full thickness wound  DO NOT USE:  Present on Admission: No  Dressing Type Foam - Lift dressing to assess site every shift 01/07/24 2007            Data Review: I have personally reviewed the laboratory data and studies available.  F/u labs ordered Unresulted Labs (From admission, onward)     Start     Ordered   01/09/24 0500  Magnesium   Daily,   R      Question:  Specimen collection method  Answer:  Lab=Lab collect   01/08/24 0740   01/09/24 0500  Basic metabolic panel with GFR  Daily,   R     Question:  Specimen collection method  Answer:  Lab=Lab collect   01/08/24 0740   01/03/24 1107  Magnesium   Daily,   R     Question:  Specimen collection method  Answer:  Lab=Lab collect   01/03/24 1106             SIGNED: Adriana DELENA Grams, MD, FHM. FAAFP [facility],  Pager (please use [email or web address] to page/text)  Please use Epic Secure Chat for non-urgent communication (7AM-7PM) If 7PM-7AM, please contact night-coverage [email or web address],  01/08/2024, 11:51 AM

## 2024-01-09 DIAGNOSIS — E162 Hypoglycemia, unspecified: Secondary | ICD-10-CM | POA: Diagnosis not present

## 2024-01-09 DIAGNOSIS — Z515 Encounter for palliative care: Secondary | ICD-10-CM | POA: Diagnosis not present

## 2024-01-09 DIAGNOSIS — Z5900 Homelessness unspecified: Secondary | ICD-10-CM | POA: Diagnosis not present

## 2024-01-09 DIAGNOSIS — E440 Moderate protein-calorie malnutrition: Secondary | ICD-10-CM | POA: Diagnosis not present

## 2024-01-09 DIAGNOSIS — R531 Weakness: Secondary | ICD-10-CM | POA: Diagnosis not present

## 2024-01-09 LAB — GLUCOSE, CAPILLARY
Glucose-Capillary: 104 mg/dL — ABNORMAL HIGH (ref 70–99)
Glucose-Capillary: 157 mg/dL — ABNORMAL HIGH (ref 70–99)

## 2024-01-09 LAB — BASIC METABOLIC PANEL WITH GFR
Anion gap: 5 (ref 5–15)
BUN: 19 mg/dL (ref 8–23)
CO2: 18 mmol/L — ABNORMAL LOW (ref 22–32)
Calcium: 8.4 mg/dL — ABNORMAL LOW (ref 8.9–10.3)
Chloride: 104 mmol/L (ref 98–111)
Creatinine, Ser: 0.58 mg/dL — ABNORMAL LOW (ref 0.61–1.24)
GFR, Estimated: >60 mL/min (ref >60)
Glucose, Bld: 89 mg/dL (ref 70–99)
Potassium: 4.3 mmol/L (ref 3.5–5.1)
Sodium: 127 mmol/L — ABNORMAL LOW (ref 135–145)

## 2024-01-09 LAB — MAGNESIUM: Magnesium: 1.6 mg/dL — ABNORMAL LOW (ref 1.7–2.4)

## 2024-01-09 NOTE — Progress Notes (Signed)
 PROGRESS NOTE  Anthony A Bares Jr.  DOB: July 05, 1956  PCP: Pcp, No FMW:[id number]  DOA: 12/07/2023  LOS: 32 days  Hospital Day: 34  Brief narrative: Anthony [name]. is a 68 y.o. male with PMH significant for homelessness, chronic alcoholism c/b withdrawals/DTs, chronic smoking, HTN, paroxysmal A-fib, h/o PE previously on Eliquis , hiatal hernia s/p Nissen fundoplication 2016, chronic low back pain, chronic anxiety. Recently hospitalized for opiate withdrawal treatment 5/20, patient presented to the ED for confusion, low blood sugar level   During this hospitalization, he has also been seen by critical care, CHF team, GI, neurology, psychiatry and palliative care.  During this admission , had intermittent agitation- requiring Ativan  per CIWA protocol, transferred to [facility] Patient was placed on Precedex , Librium , clonazepam ... Concluded treatment Patient was seen by GI, started on stress dose steroids.  Patient was also seen by advanced heart failure team for biventricular heart failure.    He was transferred to TRH service for further management of hypoglycemia, hyponatremia.  Per psychiatry consultation, patient lacks decision-making capacity, APS involved for placement.  Subjective: The patient was seen and examined this morning, stable no acute distress    Pending APS evaluation, pending discharge TOC/social worker assistance    assessment and plan: SIRS with hypotension Resolved-stable  completed 5-day course of antibiotics with IV Zosyn     Alcoholic liver cirrhosis. Alcohol  withdrawal symptoms -Resolved -S/p symptomatic agitation-ICU setting with Precedex  folate, multivitamin, thiamine .   Hospital delirium- resolved-mentation back to baseline MRI brain showed advanced atrophy, Treated for possible wernicke. May also had  hepatic encephalopathy as below. 6/15, psychiatry sedated, patient lacks decision-making capacity.    Acute blood loss anemia /  Retroperitoneal  hematoma/ Chronic iron  deficiency anemia Stable now  Hemoglobin dropped to the lowest of 6 on 5/29.   Diagnosed to have retroperitoneal hematoma.   Received total of 5 units of PRBC Eliquis  has subsequently been resumed. No active bleeding right now.  -H&H remained stable  A fib with RVR On amiodarone  oral 6/14, patient was in RVR likely due to fever.   Cardizem  drip was added and later stopped.   Eliquis  plan as above   HFrEF - EF 30 to 40%  Blood pressure running soft-   not on any antihypertensives or GDMT.  H/o PE - Eliquis  plan as above   MDD -hold brexiprazole   Hyponatremia -monitoring and improved, continue sodium supplements  Hypomagnesemia -persisting, continue oral mag (multiple IV fluid infusion)   Moderate malnutrition Body mass index is 23.24 kg/m. Eating fair. Cortrak removed 12/30/23. Encourage oral feeds, supplements.    Homelessness -disposition TOC working on safe discharge plan-patient deemed stable to be discharged as of 01/05/2024  APS involved.  6/15, per psychiatry, patient lacks capacity for decision-making   Palliative on board for goals of care discussion. Case management involved for placement      Disposition: Patient deemed stable from medical standpoint for discharge  goals of care   Code Status: Limited: Do not attempt resuscitation (DNR) -DNR-LIMITED -Do Not Intubate/DNI      DVT prophylaxis:   apixaban  (ELIQUIS ) tablet 5 mg   Antimicrobials: None Fluid: None Consultants: Psychiatry, palliative care Family Communication: None at bedside.   6/16, ID called and spoke to patient's brother [name]  He was upset because of the repeated calls he is getting regarding the patient.  He repeated again that he does not want to make any decision for Colton.  I asked him if patient sister Kyra would be willing  to and he said she is also in the same page as he is and trying to talk to her about the same thing would not serve any benefit.  I am  not aware of what dynamics there is between the siblings.  But at least at this time, they are not willing to make a decision for Breylin. Palliative care consult following.  Status: Inpatient Level of care:  Med-Surg   Patient is from: Homeless Needs to continue in-hospital care: Disposition challenge Anticipated d/c to: Unclear at this time   Scheduled Meds:  amiodarone   200 mg Oral BID   apixaban   5 mg Oral BID   feeding supplement  237 mL Oral TID BM   Gerhardt's butt cream   Topical BID   lidocaine   1 patch Transdermal Q24H   loratadine   10 mg Oral Daily   magnesium  oxide  800 mg Oral BID   multivitamin with minerals  1 tablet Oral Daily   mouth rinse  15 mL Mouth Rinse 4 times per day   pantoprazole   40 mg Oral Daily   senna-docusate  1 tablet Oral QHS   sodium chloride  flush  10-40 mL Intracatheter Q12H   sodium chloride   1 g Oral TID WC    PRN meds: acetaminophen , metoprolol  tartrate, naphazoline-glycerin , OLANZapine  zydis, ondansetron  (ZOFRAN ) IV, mouth rinse, polyethylene glycol      Antimicrobials: Finished IV Zosyn  01/06/2024   Objective: Vitals:   01/09/24 0357 01/09/24 0826  BP: 109/86 99/78  Pulse: (!) 101 96  Resp: 20 19  Temp: 98.1 F (36.7 C) 98.5 F (36.9 C)  SpO2: 98% 100%    Intake/Output Summary (Last 24 hours) at 01/09/2024 1339 Last data filed at 01/09/2024 0844 Gross per 24 hour  Intake 360 ml  Output 1825 ml  Net -1465 ml   Filed Weights   01/07/24 0345 01/08/24 0346 01/09/24 0500  Weight: 65.3 kg 65.3 kg 65.3 kg   Weight change: 0 kg Body mass index is 23.24 kg/m.   Physical Exam:      General:  AAO x 3,  cooperative, no distress;   HEENT:  Normocephalic, PERRL, otherwise with in Normal limits   Neuro:  CNII-XII intact. , normal motor and sensation, reflexes intact   Lungs:   Clear to auscultation BL, Respirations unlabored,  No wheezes / crackles  Cardio:    S1/S2, RRR, No murmure, No Rubs or Gallops   Abdomen:  Soft,  non-tender, bowel sounds active all four quadrants, no guarding or peritoneal signs.  Muscular  skeletal:  Limited exam -global generalized weaknesses - in bed, able to move all 4 extremities,   2+ pulses,  symmetric, No pitting edema  Skin:  Dry, warm to touch, negative for any Rashes,  Wounds: Please see nursing documentation  Pressure Injury 12/16/23 Tibial Distal;Left;Posterior Stage 3 -  Full thickness tissue loss. Subcutaneous fat may be visible but bone, tendon or muscle are NOT exposed. this is NOT a pressure injury this is a full thickness wound (Active)  12/16/23 0800  Location: Tibial  Location Orientation: Distal;Left;Posterior  Staging: Stage 3 -  Full thickness tissue loss. Subcutaneous fat may be visible but bone, tendon or muscle are NOT exposed.  Wound Description (Comments): this is NOT a pressure injury this is a full thickness wound  DO NOT USE:  Present on Admission: No  Dressing Type Foam - Lift dressing to assess site every shift 01/09/24 0841         Data Review:  I have personally reviewed the laboratory data and studies available.  F/u labs ordered Unresulted Labs (From admission, onward)     Start     Ordered   01/09/24 0500  Basic metabolic panel with GFR  Daily,   R     Question:  Specimen collection method  Answer:  Lab=Lab collect   01/08/24 0740   01/03/24 1107  Magnesium   Daily,   R     Question:  Specimen collection method  Answer:  Lab=Lab collect   01/03/24 1106             SIGNED: Adriana DELENA Grams, MD, FHM. FAAFP [facility],  Pager (please use [email or web address] to page/text)  Please use Epic Secure Chat for non-urgent communication (7AM-7PM) If 7PM-7AM, please contact night-coverage [email or web address],  01/09/2024, 1:39 PM

## 2024-01-09 NOTE — Plan of Care (Signed)

## 2024-01-09 NOTE — Progress Notes (Signed)
 Mobility Specialist Progress Note:    01/09/24 1100  Mobility  Activity Ambulated with assistance in hallway  Level of Assistance Contact guard assist, steadying assist  Assistive Device Front wheel walker  Distance Ambulated (ft) 120 ft  Activity Response Tolerated well  Mobility Referral Yes  Mobility visit 1 Mobility  Mobility Specialist Start Time (ACUTE ONLY) 1025  Mobility Specialist Stop Time (ACUTE ONLY) 1033  Mobility Specialist Time Calculation (min) (ACUTE ONLY) 8 min   Received pt in bed having no complaints and agreeable to mobility. Pt was asymptomatic throughout ambulation and returned to room w/o fault. Left in bed w/ call bell in reach and all needs met. Bed alarm on.   D'Vante Nicholaus Mobility Specialist Please contact via Special educational needs teacher or Rehab office at [phone]

## 2024-01-09 NOTE — Progress Notes (Signed)
 Patient ID: Anthony [name]., male   DOB: [birth year], 68 y.o.   MRN: [id number]    Progress Note from the Palliative Medicine Team at [facility]   Patient Name: Anthony [name].        Date: 01/09/2024 DOB: 1956-05-25  Age: 68 y.o. MRN#: [id number] Attending Physician: Anthony Adriana DELENA, MD Primary Care Physician: Pcp, No Admit Date: 12/07/2023   Reason for Consultation/Follow-up   Establishing Goals of Care   HPI/ Brief Hospital Review  68 year old man who presented to [facility] ED 5/20 for SOB. PMHx significant for HTN, aortic atherosclerosis, PAF, PE (previously on Eliquis ), EtOH abuse c/b withdrawal/DTs, tobacco abuse, hiatal hernia (s/p Nissen 2016), chronic low back pain, anxiety who is homeless, recently discharged after opiate withdrawal treatment presented to the ED for evaluation of delirium, hyperglycemia admitted to TRH service for EtOH withdrawal.   Patient does not have medical decision-making capacity per psychiatry    Family face treatment option decision, advanced directive decisions and anticipatory care needs.   Subjective  Extensive chart review has been completed prior to meeting with patient/family  including labs, vital signs, imaging, progress/consult notes, orders, medications and available advance directive documents.    This NP assessed patient at the bedside as a follow up for palliative medicine needs and emotional support.  Patient is alert, he is able to talk about current news events. At this time he continues to have [place] insight into his current medical/social situation.  I spoke to his brother Anthony [name] by telephone, Anthony [name] lives in [place] .  He understands the complexity of patient's psychosocial situation.   I attempted to explain and offered information on his brother's current situation specifically to the fact that patient does not have medical decision making capacity. If a family member or person with an established relationship with the patient  who is acting in good faith and can reliably convey the wishes of the patient does not accept responsibility for decisions, Department of Social Services will get involved and move forward with process.Anthony [name] let Anthony [name] know that the hospital has moved forward with APS referral  Anthony [name] tells me I am not willing to make decisions that my brother can make himself.  Again education offered on capacity and competency.  Anthony [name] suggest that I call his sister/Anthony [name] who lives on the [address]  I did place a call but was unable to leave a message or speak to her.   Questions and concerns addressed    PMT will continue to support holistically   Time: 35  minutes  Detailed review of medical records ( labs, imaging, vital signs), medically appropriate exam ( MS, skin, cardiac,  resp)   discussed with treatment team, counseling and education to patient, family, staff, documenting clinical information, medication management, coordination of care    Anthony Plants NP  Palliative Medicine Team Team Phone # [phone] Pager [phone]

## 2024-01-10 DIAGNOSIS — E162 Hypoglycemia, unspecified: Secondary | ICD-10-CM | POA: Diagnosis not present

## 2024-01-10 LAB — GLUCOSE, CAPILLARY
Glucose-Capillary: 90 mg/dL (ref 70–99)
Glucose-Capillary: 114 mg/dL — ABNORMAL HIGH (ref 70–99)
Glucose-Capillary: 139 mg/dL — ABNORMAL HIGH (ref 70–99)

## 2024-01-10 LAB — BASIC METABOLIC PANEL WITH GFR
Anion gap: 3 — ABNORMAL LOW (ref 5–15)
BUN: 19 mg/dL (ref 8–23)
CO2: 22 mmol/L (ref 22–32)
Calcium: 8.8 mg/dL — ABNORMAL LOW (ref 8.9–10.3)
Chloride: 102 mmol/L (ref 98–111)
Creatinine, Ser: 0.53 mg/dL — ABNORMAL LOW (ref 0.61–1.24)
GFR, Estimated: >60 mL/min (ref >60)
Glucose, Bld: 79 mg/dL (ref 70–99)
Potassium: 3.9 mmol/L (ref 3.5–5.1)
Sodium: 127 mmol/L — ABNORMAL LOW (ref 135–145)

## 2024-01-10 LAB — MAGNESIUM: Magnesium: 1.4 mg/dL — ABNORMAL LOW (ref 1.7–2.4)

## 2024-01-10 MED ORDER — ENSURE PLUS HIGH PROTEIN PO LIQD
237.0000 mL | ORAL | Status: DC
  Administered 2024-01-11 – 2024-01-27 (×15): 237 mL via ORAL

## 2024-01-10 MED ORDER — JUVEN PO PACK
1.0000 | PACK | Freq: Two times a day (BID) | ORAL | Status: DC
  Administered 2024-01-11 – 2024-02-28 (×87): 1 via ORAL
  Filled 2024-01-10 (×85): qty 1

## 2024-01-10 MED ORDER — ZINC SULFATE 220 (50 ZN) MG PO CAPS
220.0000 mg | ORAL_CAPSULE | Freq: Every day | ORAL | Status: AC
  Administered 2024-01-10 – 2024-01-24 (×15): 220 mg via ORAL
  Filled 2024-01-10 (×15): qty 1

## 2024-01-10 MED ORDER — VITAMIN C 500 MG PO TABS
500.0000 mg | ORAL_TABLET | Freq: Every day | ORAL | Status: AC
  Administered 2024-01-10 – 2024-02-08 (×30): 500 mg via ORAL
  Filled 2024-01-10 (×31): qty 1

## 2024-01-10 MED ORDER — MAGNESIUM SULFATE 2 GM/50ML IV SOLN
2.0000 g | Freq: Once | INTRAVENOUS | Status: AC
  Administered 2024-01-10: 2 g via INTRAVENOUS
  Filled 2024-01-10: qty 50

## 2024-01-10 NOTE — Plan of Care (Signed)

## 2024-01-10 NOTE — Progress Notes (Signed)
 PROGRESS NOTE  Anthony A Towner Jr.  DOB: October 08, 1955  PCP: Pcp, No FMW:[id number]  DOA: 12/07/2023  LOS: 33 days  Hospital Day: 35  Brief narrative: Anthony A Lasky Mickey. is a 68 y.o. male with PMH significant for homelessness, chronic alcoholism c/b withdrawals/DTs, chronic smoking, HTN, paroxysmal A-fib, h/o PE previously on Eliquis , hiatal hernia s/p Nissen fundoplication 2016, chronic low back pain, chronic anxiety. Recently hospitalized for opiate withdrawal treatment 5/20, patient presented to the ED for confusion, low blood sugar level   During this hospitalization, he has also been seen by critical care, CHF team, GI, neurology, psychiatry and palliative care.  During this admission , had intermittent agitation- requiring Ativan  per CIWA protocol, transferred to PCCM Patient was placed on Precedex , Librium , clonazepam ... Concluded treatment Patient was seen by GI, started on stress dose steroids.  Patient was also seen by advanced heart failure team for biventricular heart failure.    He was transferred to TRH service for further management of hypoglycemia, hyponatremia.  Per psychiatry consultation, patient lacks decision-making capacity, APS involved for placement.  Subjective:  The patient was seen and examined this morning, stable no acute distress. No issues overnight Mildly tachycardic otherwise hemodynamically stable   Pending APS evaluation, pending discharge TOC/social worker assistance    assessment and plan: SIRS with hypotension Resolved-stable  completed 5-day course of antibiotics with IV Zosyn     Alcoholic liver cirrhosis. Alcohol  withdrawal symptoms -Resolved -S/p symptomatic agitation-ICU setting with Precedex  folate, multivitamin, thiamine .   Hospital delirium- resolved-mentation back to baseline MRI brain showed advanced atrophy, Treated for possible wernicke. May also had  hepatic encephalopathy as below. 6/15, psychiatry sedated, patient lacks  decision-making capacity.    Acute blood loss anemia /  Retroperitoneal hematoma/ Chronic iron  deficiency anemia Stable now  Hemoglobin dropped to the lowest of 6 on 5/29.   Diagnosed to have retroperitoneal hematoma.   Received total of 5 units of PRBC Eliquis  has subsequently been resumed. No active bleeding right now.  -H&H remained stable  A fib with RVR On amiodarone  oral, and Eliquis  6/14, patient was in RVR likely due to fever.   Cardizem  drip was added and later stopped.      HFrEF - EF 30 to 40% -soft BP-   not on any antihypertensives or GDMT.  H/o PE - Eliquis  plan as above   MDD -hold brexiprazole   Hyponatremia -monitoring and improved, continue sodium supplements  Hypomagnesemia -persisting, continue oral mag (multiple IV fluid infusion)   Moderate malnutrition Body mass index is 23.24 kg/m. Eating fair. Cortrak removed 12/30/23. Encourage oral feeds, supplements.    Homelessness -disposition TOC working on safe discharge plan-patient deemed stable to be discharged as of 01/05/2024  APS involved.  6/15, per psychiatry, patient lacks capacity for decision-making   Palliative on board for goals of care discussion. Case management involved for placement Disposition:    Patient deemed stable from medical standpoint for discharge  goals of care   Code Status: Limited: Do not attempt resuscitation (DNR) -DNR-LIMITED -Do Not Intubate/DNI      DVT prophylaxis:   apixaban  (ELIQUIS ) tablet 5 mg   Antimicrobials: None Fluid: None Consultants: Psychiatry, palliative care Family Communication: None at bedside.   6/16, ID called and spoke to patient's brother [name]  He was upset because of the repeated calls he is getting regarding the patient.  He repeated again that he does not want to make any decision for [place].  I asked him if patient sister Kyra would  be willing to and he said she is also in the same page as he is and trying to talk to her about the same  thing would not serve any benefit.  I am not aware of what dynamics there is between the siblings.  But at least at this time, they are not willing to make a decision for [place]. Palliative care consult following.  Status: Inpatient Level of care:  Med-Surg   Patient is from: Homeless Needs to continue in-hospital care: Disposition challenge Anticipated d/c to: Unclear at this time   Scheduled Meds:  amiodarone   200 mg Oral BID   apixaban   5 mg Oral BID   feeding supplement  237 mL Oral TID BM   Gerhardt's butt cream   Topical BID   lidocaine   1 patch Transdermal Q24H   loratadine   10 mg Oral Daily   magnesium  oxide  800 mg Oral BID   multivitamin with minerals  1 tablet Oral Daily   mouth rinse  15 mL Mouth Rinse 4 times per day   pantoprazole   40 mg Oral Daily   senna-docusate  1 tablet Oral QHS   sodium chloride  flush  10-40 mL Intracatheter Q12H   sodium chloride   1 g Oral TID WC    PRN meds: acetaminophen , metoprolol  tartrate, naphazoline-glycerin , OLANZapine  zydis, ondansetron  (ZOFRAN ) IV, mouth rinse, polyethylene glycol      Antimicrobials: Finished IV Zosyn  01/06/2024   Objective: Vitals:   01/10/24 0328 01/10/24 0853  BP: 106/71 117/87  Pulse: 99 (!) 104  Resp: 16 17  Temp: 98.1 F (36.7 C) 98.1 F (36.7 C)  SpO2: 97% 99%    Intake/Output Summary (Last 24 hours) at 01/10/2024 1116 Last data filed at 01/09/2024 2121 Gross per 24 hour  Intake 360 ml  Output 575 ml  Net -215 ml   Filed Weights   01/07/24 0345 01/08/24 0346 01/09/24 0500  Weight: 65.3 kg 65.3 kg 65.3 kg   Weight change:  Body mass index is 23.24 kg/m.   Physical Exam:        General:  Awake alert oriented x 3, in no acute distress  HEENT:  Normocephalic, PERRL, otherwise with in Normal limits   Neuro:  CNII-XII intact. , normal motor and sensation, reflexes intact   Lungs:   Clear to auscultation BL, Respirations unlabored,  No wheezes / crackles  Cardio:    S1/S2, RRR, No  murmure, No Rubs or Gallops   Abdomen:  Soft, non-tender, bowel sounds active all four quadrants, no guarding or peritoneal signs.  Muscular  skeletal:  Limited exam -global generalized weaknesses - in bed, able to move all 4 extremities,   2+ pulses,  symmetric, No pitting edema  Skin:  Dry, warm to touch, negative for any Rashes,  Wounds: Please see nursing documentation  Pressure Injury 12/16/23 Tibial Distal;Left;Posterior Stage 3 -  Full thickness tissue loss. Subcutaneous fat may be visible but bone, tendon or muscle are NOT exposed. this is NOT a pressure injury this is a full thickness wound (Active)  12/16/23 0800  Location: Tibial  Location Orientation: Distal;Left;Posterior  Staging: Stage 3 -  Full thickness tissue loss. Subcutaneous fat may be visible but bone, tendon or muscle are NOT exposed.  Wound Description (Comments): this is NOT a pressure injury this is a full thickness wound  DO NOT USE:  Present on Admission: No  Dressing Type Foam - Lift dressing to assess site every shift 01/10/24 0844  Data Review: I have personally reviewed the laboratory data and studies available.  F/u labs ordered Unresulted Labs (From admission, onward)     Start     Ordered   01/03/24 1107  Magnesium   Daily,   R     Question:  Specimen collection method  Answer:  Lab=Lab collect   01/03/24 1106             SIGNED: Adriana DELENA Grams, MD, FHM. FAAFP [facility],  Pager (please use [email or web address] to page/text)  Please use Epic Secure Chat for non-urgent communication (7AM-7PM) If 7PM-7AM, please contact night-coverage [email or web address],  01/10/2024, 11:16 AM

## 2024-01-10 NOTE — Progress Notes (Addendum)
 Nutrition Follow-up  DOCUMENTATION CODES:   Non-severe (moderate) malnutrition in context of chronic illness  INTERVENTION:   Continue Magic cup TID with meals, each supplement provides 290 kcal and 9 grams of protein Continue to offer Ensure Plus High Protein po TID, each supplement provides 350 kcal and 20 grams of protein Add 1 packet Juven BID, each packet provides 95 calories, 2.5 grams of protein (collagen) to support wound healing Add 500 mg Vitamin C  daily x 30 days and 220 mg Zinc  Sulfate daily x 15 days for wound healing Continue MVI with minerals daily  NUTRITION DIAGNOSIS:   Moderate Malnutrition related to social / environmental circumstances (ETOH use, homeless) as evidenced by moderate fat depletion, moderate muscle depletion. - Still applicable   GOAL:   Patient will meet greater than or equal to 90% of their needs - Progressing   MONITOR:   Diet advancement, Labs, Weight trends, TF tolerance  REASON FOR ASSESSMENT:   Consult Assessment of nutrition requirement/status  ASSESSMENT:   68 yo male admitted with EtOH withdrawal with DTs, lactic acidosis, concern for possible sepsis-?biliary source. PMH includes EtOH abuse with recurrent admissions for DTs, tobacco use, hiatal hernia s/p nissen (2016), HTN. Pt is homeless  Pt advanced from dysphagia 3 with thin liquids to a regular diet. Meal intakes continue to be good, 75-100%.Continues drinking at least 2-3 Ensures per day. Magic cup being added to trays. Prosource Plus discontinued on 6/19. If PO intake starts to decline may consider restarting but pt's intake is meeting appropriate needs as of now.    Ongoing GOC with family for pt. Per psychiatry, patient does not have medical decision-making capacity   Admit weight: 65 kg Current weight: 65.7 kg   Average Meal Intake: 6/17: 75% x 2 meals 6/18: 75-100% x 3 meals 6/19: 100% x 1 meal 6/22: 75-100% x 2 meals   Nutritionally Relevant  Medications: Scheduled Meds:  amiodarone   200 mg Oral BID   apixaban   5 mg Oral BID   [START ON 01/11/2024] feeding supplement  237 mL Oral Q24H   Gerhardt's butt cream   Topical BID   lidocaine   1 patch Transdermal Q24H   magnesium  oxide  800 mg Oral BID   multivitamin with minerals  1 tablet Oral Daily   mouth rinse  15 mL Mouth Rinse 4 times per day   pantoprazole   40 mg Oral Daily   senna-docusate  1 tablet Oral QHS   sodium chloride  flush  10-40 mL Intracatheter Q12H   sodium chloride   1 g Oral TID WC   Labs Reviewed: Sodium 127 Creatinine 0.53 Calcium  8.8 Magnesium  1.4 Albumin  3 AST 60 ALT 60 CBG ranges from 90-157 mg/dL over the last 24 hours HgbA1c 5.2   Diet Order:   Diet Order             Diet regular Room service appropriate? Yes; Fluid consistency: Thin  Diet effective now                   EDUCATION NEEDS:   No education needs have been identified at this time  Skin:  Skin Assessment: Skin Integrity Issues: Skin Integrity Issues:: Stage III Stage II: N/A Stage III: L tibial Other: IAD to rectum, full thickness lt lower leg wound  Last BM:  6/15  Height:   Ht Readings from Last 1 Encounters:  12/22/23 5' 6 (1.676 m)    Weight:   Wt Readings from Last 1 Encounters:  01/10/24 65.7 kg  Ideal Body Weight:  70 kg  BMI:  Body mass index is 23.38 kg/m.  Estimated Nutritional Needs:   Kcal:  1900-2100  Protein:  95-110g  Fluid:  >/=1.9L   Olivia Kenning, RD Registered Dietitian  See Amion for more information

## 2024-01-10 NOTE — Progress Notes (Signed)
 Mobility Specialist: Progress Note   01/10/24 1346  Mobility  Activity Ambulated with assistance in hallway  Level of Assistance Contact guard assist, steadying assist  Assistive Device Front wheel walker  Distance Ambulated (ft) 250 ft  Activity Response Tolerated well  Mobility Referral Yes  Mobility visit 1 Mobility  Mobility Specialist Start Time (ACUTE ONLY) [id number]  Mobility Specialist Stop Time (ACUTE ONLY) 0913  Mobility Specialist Time Calculation (min) (ACUTE ONLY) 15 min    Pt received in bed, agreeable to mobility session. Requesting to use BR first. Void and BM successful, pericare done ind. Ambulated w/o AD in room, but pt requested to use RW for hallway ambulation. CG throughout. No complaints. Returned to room without fault. Left in bed with all needs met, call bell in reach.   Ileana Lute Mobility Specialist Please contact via SecureChat or Rehab office at [phone]

## 2024-01-10 NOTE — Progress Notes (Signed)
 Physical Therapy Treatment Patient Details Name: Anthony A Rhudy Jr. MRN: [id number] DOB: March 01, 1956 Today's Date: 01/10/2024   History of Present Illness Pt is a 68 y.o. male who presented 12/06/23 with SOB. Shortly after arriving, pt became agitated and was given IV Ativan . CT head is unremarkable. Patient admitted for EtOH withdrawal, acute delirium, and hypoglycemia. Cortrak placed 5/23.  The day before the patient had presented to ED requesting EtOH detox; patient was deemed to be not actively in EtOH withdrawal and was discharged, however upon discharge patient threatened staff and was escorted off of the premises by [employer]. PMH includes alcohol  abuse, HTN, CVA, HFmrEF, afib, PE, HLD, anxiety, depression, chronic low back pain.    PT Comments  Patient progressing with balance and LE strength though needing assist for safety with decreased awareness, decreased memory and limited environmental organization.  Eager and happy to walk the hallway with RW and seemingly reliant though in the room walking with no device with some A for safety.  Continued skilled PT recommended post acute for safety, balance, strength and cognition.     If plan is discharge home, recommend the following: A lot of help with bathing/dressing/bathroom;Assistance with cooking/housework;Direct supervision/assist for medications management;Direct supervision/assist for financial management;Assist for transportation;Help with stairs or ramp for entrance;Supervision due to cognitive status;A [place] help with walking and/or transfers   Can travel by private vehicle     Yes  Equipment Recommendations  Rollator (4 wheels);BSC/3in1    Recommendations for Other Services       Precautions / Restrictions Precautions Precautions: Fall Recall of Precautions/Restrictions: Intact     Mobility  Bed Mobility Overal bed mobility: Modified Independent                  Transfers Overall transfer level: Needs  assistance Equipment used: Rolling walker (2 wheels) Transfers: Sit to/from Stand Sit to Stand: Supervision           General transfer comment: no physical help needed, S for safety with set up and donning socks fully (had pulled them down stating they make his legs swell)    Ambulation/Gait Ambulation/Gait assistance: Supervision, Contact guard assist Gait Distance (Feet): 150 Feet (x 2) Assistive device: Rolling walker (2 wheels) Gait Pattern/deviations: Step-through pattern, Decreased stride length, Trunk flexed       General Gait Details: no LOB, cues each time for direction (around circle at nursing station twice); flexed posture walker at a distance, in room no device with close S/CGA for safety   Stairs             Wheelchair Mobility     Tilt Bed    Modified Rankin (Stroke Patients Only)       Balance Overall balance assessment: Needs assistance   Sitting balance-Leahy Scale: Good Sitting balance - Comments: able to donn socks seated on EOB   Standing balance support: No upper extremity supported, During functional activity Standing balance-Leahy Scale: Fair Standing balance comment: walking in room and obtaining back from closet no AD, close S to CGA for safety                            Communication Communication Communication: No apparent difficulties  Cognition Arousal: Alert Behavior During Therapy: WFL for tasks assessed/performed   PT - Cognitive impairments: Safety/Judgement, Awareness, Memory                       PT -  Cognition Comments: admits to poor memory and needs A For safety; eager to talk to social worker about plans. Following commands: Intact      Cueing Cueing Techniques: Verbal cues  Exercises      General Comments General comments (skin integrity, edema, etc.): sat on chair in room to retrieve items spilled on the floor from his b/s table, states his fault partly and fault of cafeteria workers  when placing tray; assisted to straighten table and place items he preferred in the bed for him to work with.      Pertinent Vitals/Pain Pain Assessment Pain Assessment: Faces Faces Pain Scale: Hurts a Edison bit Pain Location: chronic back pain Pain Descriptors / Indicators: Aching Pain Intervention(s): Monitored during session    Home Living                          Prior Function            PT Goals (current goals can now be found in the care plan section) Progress towards PT goals: Progressing toward goals    Frequency    Min 2X/week      PT Plan      Co-evaluation              AM-PAC PT 6 Clicks Mobility   Outcome Measure  Help needed turning from your back to your side while in a flat bed without using bedrails?: None Help needed moving from lying on your back to sitting on the side of a flat bed without using bedrails?: None Help needed moving to and from a bed to a chair (including a wheelchair)?: A Cremeans Help needed standing up from a chair using your arms (e.g., wheelchair or bedside chair)?: A Marsiglia Help needed to walk in hospital room?: A Methvin Help needed climbing 3-5 steps with a railing? : A [place]cl[place] 6 Click Score: 20    End of Session Equipment Utilized During Treatment: Gait belt Activity Tolerance: Patient tolerated treatment well Patient left: in bed;with call bell/phone within reach;with bed alarm set   PT Visit Diagnosis: Other abnormalities of gait and mobility (R26.89);Muscle weakness (generalized) (M62.81)     Time: 8487-8456 PT Time Calculation (min) (ACUTE ONLY): 31 min  Charges:    $Gait Training: 8-22 mins $Therapeutic Activity: 8-22 mins PT General Charges $$ ACUTE PT VISIT: 1 Visit                     Micheline [name], PT Acute Rehabilitation Services Office:[phone] 01/10/2024    Anthony [name] 01/10/2024, 6:50 PM

## 2024-01-11 DIAGNOSIS — Z86711 Personal history of pulmonary embolism: Secondary | ICD-10-CM | POA: Diagnosis not present

## 2024-01-11 DIAGNOSIS — E162 Hypoglycemia, unspecified: Secondary | ICD-10-CM | POA: Diagnosis not present

## 2024-01-11 DIAGNOSIS — F1010 Alcohol abuse, uncomplicated: Secondary | ICD-10-CM | POA: Diagnosis not present

## 2024-01-11 DIAGNOSIS — Z515 Encounter for palliative care: Secondary | ICD-10-CM | POA: Diagnosis not present

## 2024-01-11 DIAGNOSIS — Z5900 Homelessness unspecified: Secondary | ICD-10-CM | POA: Diagnosis not present

## 2024-01-11 LAB — MAGNESIUM: Magnesium: 1.5 mg/dL — ABNORMAL LOW (ref 1.7–2.4)

## 2024-01-11 LAB — GLUCOSE, CAPILLARY
Glucose-Capillary: 107 mg/dL — ABNORMAL HIGH (ref 70–99)
Glucose-Capillary: 101 mg/dL — ABNORMAL HIGH (ref 70–99)
Glucose-Capillary: 96 mg/dL (ref 70–99)
Glucose-Capillary: 90 mg/dL (ref 70–99)
Glucose-Capillary: 123 mg/dL — ABNORMAL HIGH (ref 70–99)

## 2024-01-11 MED ORDER — MAGNESIUM SULFATE 4 GM/100ML IV SOLN
4.0000 g | Freq: Once | INTRAVENOUS | Status: AC
  Administered 2024-01-11: 4 g via INTRAVENOUS
  Filled 2024-01-11: qty 100

## 2024-01-11 NOTE — Progress Notes (Signed)
 Mobility Specialist: Progress Note   01/11/24 1147  Mobility  Activity Ambulated with assistance in hallway  Level of Assistance Contact guard assist, steadying assist  Assistive Device Other (Comment) (IV pole)  Distance Ambulated (ft) 275 ft  Activity Response Tolerated well  Mobility Referral Yes  Mobility visit 1 Mobility  Mobility Specialist Start Time (ACUTE ONLY) [id number]  Mobility Specialist Stop Time (ACUTE ONLY) 0932  Mobility Specialist Time Calculation (min) (ACUTE ONLY) 10 min    Pt received in bed, agreeable to mobility session. CG with IV pole. No complaints. Returned to room without fault. Left in bed with all needs met, call bell in reach.   Ileana Lute Mobility Specialist Please contact via SecureChat or Rehab office at [phone]

## 2024-01-11 NOTE — Plan of Care (Signed)

## 2024-01-11 NOTE — Progress Notes (Signed)
 Occupational Therapy Treatment Patient Details Name: Anthony A Canela Jr. MRN: [id number] DOB: 08-18-1955 Today's Date: 01/11/2024   History of present illness Pt is a 68 y.o. male who presented 12/06/23 with SOB. Shortly after arriving, pt became agitated and was given IV Ativan . CT head is unremarkable. Patient admitted for EtOH withdrawal, acute delirium, and hypoglycemia. Cortrak placed 5/23 now discontinued.  The day before the patient had presented to ED requesting EtOH detox; patient was deemed to be not actively in EtOH withdrawal and was discharged, however upon discharge patient threatened staff and was escorted off of the premises by [employer]. PMH includes alcohol  abuse, HTN, CVA, HFmrEF, afib, PE, HLD, anxiety, depression, chronic low back pain.   OT comments  Patient progressing and showed improved ADL performance as evidenced by an increased score on 6-Clicks AM-PAC measure of occupational Performance with previous score of 19/24, and current score of 20/24.  Pt also completed Short Blessed Cognitive screen with no mistakes but need of redirection.   Pt did struggle with safety scenario questions from the KELS including:  KELS:  1. What do you do for yourself if you are sick with a cold?  (Required increased time, redirection, modifications to questions as pt so tangential-but eventually correct) 2. What do you do if you burn yourself and the wound becomes infected?  (Hydrogen peroxide and alcohol ) 3. What do you do if you experience severe chest pain and shortness of breath? (Call a doctor) 4. What number do you call in an emergency? 911   Patient remains limited by cognitive deficits, generalized weakness, limited cervical motion, chronic LBP, and decreased activity tolerance along with deficits noted below. Pt continues to demonstrate good rehab potential and would benefit from continued skilled OT to increase safety and independence with ADLs and functional transfers to allow pt to  return home safely and reduce caregiver burden and fall risk.       If plan is discharge home, recommend the following:  Assist for transportation;Direct supervision/assist for medications management;A Rosenau help with walking and/or transfers;A [place] help with bathing/dressing/bathroom;Supervision due to cognitive status   Equipment Recommendations       Recommendations for Other Services      Precautions / Restrictions Precautions Precautions: Fall Recall of Precautions/Restrictions: Intact Restrictions Weight Bearing Restrictions Per Provider Order: No       Mobility Bed Mobility Overal bed mobility: Modified Independent                  Transfers                         Balance Overall balance assessment: Needs assistance Sitting-balance support: No upper extremity supported, Feet supported Sitting balance-Leahy Scale: Good Sitting balance - Comments: able to donn socks seated on EOB Postural control: Posterior lean Standing balance support: No upper extremity supported, During functional activity Standing balance-Leahy Scale: Fair                             ADL either performed or assessed with clinical judgement   ADL Overall ADL's : Needs assistance/impaired Eating/Feeding: Modified independent;Sitting   Grooming: Wash/dry hands;Standing;Supervision/safety;Oral care               Lower Body Dressing: Supervision/safety;Sitting/lateral leans;Set up Lower Body Dressing Details (indicate cue type and reason): Able to doff and don socks at EOB with posterior bias when flexing knee/hip to reach foot but  no LOB. Toilet Transfer: Supervision/safety;Ambulation Toilet Transfer Details (indicate cue type and reason): Pt stood from EOB Mod I and ambulated to bathroom, slowly with supervision. Stood at toilet for bladder void with distant supervision. Toileting- Clothing Manipulation and Hygiene: Supervision/safety Toileting - Clothing  Manipulation Details (indicate cue type and reason): Pt voided bladder only. Managed gown Mod I.     Functional mobility during ADLs: Supervision/safety      Extremity/Trunk Assessment Upper Extremity Assessment Upper Extremity Assessment: Overall WFL for tasks assessed (Pt does report inability to open small packages like salad dressing and noted to have reduced pincer strength although total hand MMT WFL.)       Cervical / Trunk Assessment Cervical / Trunk Assessment: Kyphotic;Other exceptions Cervical / Trunk Exceptions: h/o L3-L5 traumatic vertebral compression fractures per pt report. Pt thinks this occured ~2 years ago and denied any surgery.    Vision Baseline Vision/History: 1 Wears glasses (reading) Patient Visual Report: Blurring of vision;No change from baseline Additional Comments: Pt reports distance is impaired but has no glasses. Readers from [employer].   Perception Perception Perception: Within Functional Limits   Praxis Praxis Praxis: WFL   Communication Communication Communication: No apparent difficulties Factors Affecting Communication: Hearing impaired   Cognition Arousal: Alert Behavior During Therapy:  (Hyper verbal and tangenetial.) Cognition: Cognition impaired   Orientation impairments:  (Ox4) Awareness: Online awareness intact, Intellectual awareness intact Memory impairment (select all impairments): Working memory Attention impairment (select first level of impairment): Sustained attention (Easily distracted. Endorses often forgetting what he is say mid-sentence. Reports h/o concussions when young.) Executive functioning impairment (select all impairments): Reasoning OT - Cognition Comments: Pt completed Short Blessed Test scoring a 0/28 which indicates no dementia as pt able to answer each questions correctly. Pt however struggled and required mutliple cues and modifications to questioning with safety questions from the [facility], but with  redirection, pt could eventually provide the correct answers.                 Following commands: Intact Following commands impaired: Follows one step commands with increased time      Cueing   Cueing Techniques: Verbal cues  Exercises Other Exercises Other Exercises: Pt shown pincer strengthening exercises and demonstrated back. Pt shown gentle cervical ROM exercises including retractions, rotation and lateral flexion to tolerance. Pt perfoms but needs cues to stay on task. Very easily distracted.    Shoulder Instructions       General Comments      Pertinent Vitals/ Pain       Pain Assessment Pain Assessment: Faces Faces Pain Scale: No hurt Pain Location: chronic low back pain from old fall from roof injury. Pt reports radiates to neck which is noted to be very stiff and anteriorly fixated. Pain Descriptors / Indicators: Aching, Sore Pain Intervention(s): Monitored during session, Repositioned, Limited activity within patient's tolerance  Home Living                                          Prior Functioning/Environment              Frequency  Min 2X/week        Progress Toward Goals  OT Goals(current goals can now be found in the care plan section)  Progress towards OT goals: Progressing toward goals  Acute Rehab OT Goals Patient Stated Goal: Pt only mentioning beer and chuckling  when asked today. OT Goal Formulation: With patient Time For Goal Achievement: 01/17/24 Potential to Achieve Goals: Good  Plan      Co-evaluation                 AM-PAC OT 6 Clicks Daily Activity     Outcome Measure   Help from another person eating meals?: None Help from another person taking care of personal grooming?: None Help from another person toileting, which includes using toliet, bedpan, or urinal?: A Haddox Help from another person bathing (including washing, rinsing, drying)?: A Leeman Help from another person to put on and  taking off regular upper body clothing?: A Ishaq Help from another person to put on and taking off regular lower body clothing?: A [place] 6 Click Score: 20    End of Session Equipment Utilized During Treatment:  (None)  OT Visit Diagnosis: Other symptoms and signs involving cognitive function;Unsteadiness on feet (R26.81);Low vision, both eyes (H54.2)   Activity Tolerance Patient tolerated treatment well   Patient Left with call bell/phone within reach;in bed;with bed alarm set   Nurse Communication Mobility status        Time: 8948-8865 OT Time Calculation (min): 43 min  Charges: OT General Charges $OT Visit: 1 Visit OT Treatments $Self Care/Home Management : 8-22 mins $Therapeutic Activity: 8-22 mins $Cognitive Funtion inital: Initial 15 mins  Anthony, OT Acute Rehab Services Office: [phone] 01/11/2024   Anthony [name] 01/11/2024, 11:48 AM

## 2024-01-11 NOTE — TOC Progression Note (Signed)
 Transition of Care (TOC) - Progression Note    Patient Details  Name: Anthony A Ouzts Jr. MRN: [id number] Date of Birth: November 16, 1955  Transition of Care [facility]) CM/SW Contact  Rosaline JONELLE Joe, RN Phone Number: 01/11/2024, 3:58 PM  Clinical Narrative:    CM spoke with Ronal Solan, NP with Palliative care and she states that patient has an available sister in [place]  that may be willing to assist with patient's decision.  I called and left a detailed voice message with [facility] APS Sw - [name] Ufot at [phone].  I called and left the patient's sister's phone number with [name], APS.  Patient is homeless and does not have capacity to make medical decisions and remains inpatient in the hospital.    Expected Discharge Plan: Skilled Nursing Facility Barriers to Discharge: Continued Medical Work up  Expected Discharge Plan and Services In-house Referral: Clinical Social Work     Living arrangements for the past 2 months: Homeless                                       Social Determinants of Health (SDOH) Interventions SDOH Screenings   Food Insecurity: Patient Unable To Answer (12/09/2023)  Housing: Patient Unable To Answer (12/09/2023)  Transportation Needs: Patient Unable To Answer (12/09/2023)  Utilities: Patient Unable To Answer (12/09/2023)  Depression (PHQ2-9): Medium Risk (10/16/2020)  Social Connections: Unknown (12/22/2023)  Tobacco Use: High Risk (12/08/2023)    Readmission Risk Interventions    08/27/2023   12:21 PM 08/19/2023    9:17 AM 07/12/2023    9:58 AM  Readmission Risk Prevention Plan  Transportation Screening Complete Complete Complete  Medication Review Oceanographer) Complete Complete Complete  PCP or Specialist appointment within 3-5 days of discharge Complete  --  HRI or Home Care Consult Complete Complete Complete  SW Recovery Care/Counseling Consult Complete Complete Complete  Palliative Care Screening Not Applicable Not Applicable Not  Applicable  Skilled Nursing Facility Not Applicable Not Applicable Not Applicable
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
## 2024-01-11 NOTE — Progress Notes (Signed)
 PROGRESS NOTE  Anthony A Maybee Jr.  DOB: 02-25-1956  PCP: Pcp, No FMW:[id number]  DOA: 12/07/2023  LOS: 34 days  Hospital Day: 36  Brief narrative: Anthony A Sok Mickey. is a 68 y.o. male with PMH significant for homelessness, chronic alcoholism c/b withdrawals/DTs, chronic smoking, HTN, paroxysmal A-fib, h/o PE previously on Eliquis , hiatal hernia s/p Nissen fundoplication 2016, chronic low back pain, chronic anxiety. Recently hospitalized for opiate withdrawal treatment 5/20, patient presented to the ED for confusion, low blood sugar level   During this hospitalization, he has also been seen by critical care, CHF team, GI, neurology, psychiatry and palliative care.  During this admission , had intermittent agitation- requiring Ativan  per CIWA protocol, transferred to [facility] Patient was placed on Precedex , Librium , clonazepam ... Concluded treatment Patient was seen by GI, started on stress dose steroids.  Patient was also seen by advanced heart failure team for biventricular heart failure.    He was transferred to TRH service for further management of hypoglycemia, hyponatremia.  Per psychiatry consultation, patient lacks decision-making capacity, APS involved for placement.  Subjective:  Patient was seen and examined, stable, no acute distress.   Pending APS evaluation, pending discharge TOC/social worker assistance    assessment and plan:  SIRS with hypotension Resolved-remained stable  completed 5-day course of antibiotics with IV Zosyn     Alcoholic liver cirrhosis. Alcohol  withdrawal symptoms -Resolved -S/p symptomatic agitation-ICU setting with Precedex  folate, multivitamin, thiamine .   Hospital delirium- resolved-mentation back to baseline MRI brain showed advanced atrophy, Treated for possible wernicke. May also had  hepatic encephalopathy as below. 6/15, psychiatry sedated, patient lacks decision-making capacity.    Acute blood loss anemia /  Retroperitoneal hematoma/  Chronic iron  deficiency anemia Stable now  Hemoglobin dropped to the lowest of 6 on 5/29.   Diagnosed to have retroperitoneal hematoma.   Received total of 5 units of PRBC Eliquis  has subsequently been resumed. No active bleeding right now.  -H&H remained stable  A fib with RVR On amiodarone  oral, and Eliquis  6/14, patient was in RVR likely due to fever.   Cardizem  drip was added and later stopped.      HFrEF - EF 30 to 40% -soft BP-   not on any antihypertensives or GDMT.  H/o PE - Eliquis  plan as above   MDD -hold brexiprazole   Hyponatremia -monitoring and improved, continue sodium supplements  Hypomagnesemia -persisting, continue oral mag (multiple IV fluid infusion)   Moderate malnutrition Body mass index is 23.38 kg/m. Eating fair. Cortrak removed 12/30/23. Encourage oral feeds, supplements.    Homelessness -disposition TOC working on safe discharge plan-patient deemed stable to be discharged as of 01/05/2024  APS involved.  6/15, per psychiatry, patient lacks capacity for decision-making   Palliative on board for goals of care discussion. Case management involved for placement Disposition:    Patient deemed stable from medical standpoint for discharge  goals of care   Code Status: Limited: Do not attempt resuscitation (DNR) -DNR-LIMITED -Do Not Intubate/DNI      DVT prophylaxis:   apixaban  (ELIQUIS ) tablet 5 mg   Antimicrobials: None Fluid: None Consultants: Psychiatry, palliative care Family Communication: None at bedside.   6/16, ID called and spoke to patient's brother Anthony [name].  He was upset because of the repeated calls he is getting regarding the patient.  He repeated again that he does not want to make any decision for Anthony [name].  I asked him if patient sister Anthony [name] would be willing to and he said she is also  in the same page as he is and trying to talk to her about the same thing would not serve any benefit.  I am not aware of what dynamics there is between  the siblings.  But at least at this time, they are not willing to make a decision for Anthony [name]. Palliative care consult following.  Status: Inpatient Level of care:  Med-Surg   Patient is from: Homeless Needs to continue in-hospital care: Disposition challenge Anticipated d/c to: Unclear at this time   Scheduled Meds:  amiodarone   200 mg Oral BID   apixaban   5 mg Oral BID   ascorbic acid   500 mg Oral Daily   feeding supplement  237 mL Oral Q24H   Gerhardt's butt cream   Topical BID   lidocaine   1 patch Transdermal Q24H   magnesium  oxide  800 mg Oral BID   multivitamin with minerals  1 tablet Oral Daily   nutrition supplement (JUVEN)  1 packet Oral BID BM   mouth rinse  15 mL Mouth Rinse 4 times per day   pantoprazole   40 mg Oral Daily   senna-docusate  1 tablet Oral QHS   sodium chloride  flush  10-40 mL Intracatheter Q12H   sodium chloride   1 g Oral TID WC   zinc  sulfate (50mg  elemental zinc )  220 mg Oral Daily    PRN meds: acetaminophen , metoprolol  tartrate, naphazoline-glycerin , OLANZapine  zydis, ondansetron  (ZOFRAN ) IV, mouth rinse      Antimicrobials: Finished IV Zosyn  01/06/2024   Objective: Vitals:   01/11/24 0419 01/11/24 0835  BP: 118/79 111/83  Pulse: 97 (!) 54  Resp: 19 18  Temp: 97.7 F (36.5 C) 98.2 F (36.8 C)  SpO2: 98% 96%    Intake/Output Summary (Last 24 hours) at 01/11/2024 1305 Last data filed at 01/11/2024 9177 Gross per 24 hour  Intake --  Output 1050 ml  Net -1050 ml   Filed Weights   01/09/24 0500 01/10/24 1100 01/11/24 0500  Weight: 65.3 kg 65.7 kg 65.7 kg   Weight change:  Body mass index is 23.38 kg/m.   Physical Exam:    General:  AAO x 3,  cooperative, no distress;   HEENT:  Normocephalic, PERRL, otherwise with in Normal limits   Neuro:  CNII-XII intact. , normal motor and sensation, reflexes intact   Lungs:   Clear to auscultation BL, Respirations unlabored,  No wheezes / crackles  Cardio:    S1/S2, RRR, No murmure, No  Rubs or Gallops   Abdomen:  Soft, non-tender, bowel sounds active all four quadrants, no guarding or peritoneal signs.  Muscular  skeletal:  Limited exam -global generalized weaknesses - in bed, able to move all 4 extremities,   2+ pulses,  symmetric, No pitting edema  Skin:  Dry, warm to touch, negative for any Rashes,  Wounds: Please see nursing documentation  Pressure Injury 12/16/23 Tibial Distal;Left;Posterior Stage 3 -  Full thickness tissue loss. Subcutaneous fat may be visible but bone, tendon or muscle are NOT exposed. this is NOT a pressure injury this is a full thickness wound (Active)  12/16/23 0800  Location: Tibial  Location Orientation: Distal;Left;Posterior  Staging: Stage 3 -  Full thickness tissue loss. Subcutaneous fat may be visible but bone, tendon or muscle are NOT exposed.  Wound Description (Comments): this is NOT a pressure injury this is a full thickness wound  DO NOT USE:  Present on Admission: No  Dressing Type Foam - Lift dressing to assess site every shift 01/11/24 9177  Data Review: I have personally reviewed the laboratory data and studies available.  F/u labs ordered Unresulted Labs (From admission, onward)     Start     Ordered   01/03/24 1107  Magnesium   Daily,   R     Question:  Specimen collection method  Answer:  Lab=Lab collect   01/03/24 1106            Time spent 35 minutes SIGNED: Adriana DELENA Grams, MD, FHM. FAAFP [facility],  Pager (please use [email or web address] to page/text)  Please use Epic Secure Chat for non-urgent communication (7AM-7PM) If 7PM-7AM, please contact night-coverage [email or web address],  01/11/2024, 1:05 PM

## 2024-01-12 DIAGNOSIS — E162 Hypoglycemia, unspecified: Secondary | ICD-10-CM | POA: Diagnosis not present

## 2024-01-12 DIAGNOSIS — I480 Paroxysmal atrial fibrillation: Secondary | ICD-10-CM | POA: Diagnosis not present

## 2024-01-12 DIAGNOSIS — I10 Essential (primary) hypertension: Secondary | ICD-10-CM | POA: Diagnosis not present

## 2024-01-12 DIAGNOSIS — F10931 Alcohol use, unspecified with withdrawal delirium: Secondary | ICD-10-CM | POA: Diagnosis not present

## 2024-01-12 LAB — MAGNESIUM: Magnesium: 1.8 mg/dL (ref 1.7–2.4)

## 2024-01-12 LAB — GLUCOSE, CAPILLARY
Glucose-Capillary: 92 mg/dL (ref 70–99)
Glucose-Capillary: 106 mg/dL — ABNORMAL HIGH (ref 70–99)
Glucose-Capillary: 86 mg/dL (ref 70–99)
Glucose-Capillary: 91 mg/dL (ref 70–99)

## 2024-01-12 MED ORDER — UREA 15 G PO PACK
15.0000 g | PACK | Freq: Two times a day (BID) | ORAL | Status: DC
  Administered 2024-01-12 – 2024-02-28 (×92): 15 g via ORAL
  Filled 2024-01-12 (×98): qty 1

## 2024-01-12 NOTE — Progress Notes (Signed)
 Progress Note   Patient: Anthony A Echavarria Jr. FMW:[id number] DOB: 1955-08-29 DOA: 12/07/2023  DOS: the patient was seen and examined on 01/12/2024   Brief hospital course:  68 y.o. male with PMH significant for homelessness, chronic alcoholism c/b withdrawals/DTs, chronic smoking, HTN, paroxysmal A-fib, h/o PE previously on Eliquis , hiatal hernia s/p Nissen fundoplication 2016, chronic low back pain, chronic anxiety, opiate withdrawal presented to the ED for confusion, low blood sugar.   During this hospitalization, he has also been seen by critical care, CHF team, GI, neurology, psychiatry and palliative care.   During this admission , had intermittent agitation- requiring Ativan  per CIWA protocol, transferred to PCCM Patient was placed on Precedex , Librium , clonazepam ... Concluded treatment Patient was seen by GI, started on stress dose steroids.  Patient was also seen by advanced heart failure team for biventricular heart failure.     He was transferred to TRH service for further management of hypoglycemia, hyponatremia.   Per psychiatry consultation, patient lacks decision-making capacity, APS involved for placement.  Subsequently stable for DC, pending placement.  Assessment and Plan:  SIRS with hypotension - Resolved.  Completed empiric 5-day course of Zosyn .  Acute metabolic encephalopathy secondary to alcohol  withdrawal - Previously in the ICU getting Precedex .  Vitamin supplementation.  Now resolved.  Hospital delirium - Likely underlying W[place]ke.  Appears resolved at this time, patient back to his baseline.  Retroperitoneal hematoma/chronic iron  deficiency anemia/acute blood loss anemia - S/p 5 units total RBCs during course of hospital stay.  Hemoglobin stable.  No active bleeding.  Eliquis  resumed.  A-fib with RVR - Initially on Cardizem  drip.  Subsequently discontinued, transition to amiodarone  p.o. Eliquis .  Rate appears well-controlled at this time.  HFrEF - EF  30-40%.  GDMT limited by hypotension.  Hyponatremia - Somewhat persistent despite sodium supplementation/IV fluids.  Will order urea powder.  Recheck BMP in AM.  Hypokalemia/hypomagnesemia - Supplementation on board.  Will recheck BMP and magnesium  in AM.  Goals of care - Patient is DNR.  Patient was homeless, TOC working on dispo discharge disposition.  APS involved.  Per psychiatry 6/15 the patient lacks capacity for decision-making.  Subjective: Patient resting comfortably this morning.  Asking if he is able to shower and move around the hospital without a bed alarm.  Denies any fever, chills, shortness of breath, chest pain, nausea, vomiting, abdominal pain.  Physical Exam:  Vitals:   01/11/24 1651 01/11/24 2025 01/12/24 0545 01/12/24 0824  BP: 115/83 100/76  (!) 111/91  Pulse: 94 95  98  Resp: 18 18  18   Temp: 98.1 F (36.7 C) 98.3 F (36.8 C)  98.2 F (36.8 C)  TempSrc:      SpO2: 98% 98%  97%  Weight:   65.4 kg   Height:        GENERAL:  Alert, pleasant, no acute distress  HEENT:  EOMI CARDIOVASCULAR: Irregularly irregular RESPIRATORY:  Clear to auscultation, no wheezing, rales, or rhonchi GASTROINTESTINAL:  Soft, nontender, nondistended EXTREMITIES:  No LE edema bilaterally NEURO:  No new focal deficits appreciated SKIN:  No rashes noted PSYCH:  Appropriate mood and affect     Data Reviewed:  No new imaging to review  Previous records (including but not limited to H&P, progress notes, nursing notes, TOC management) were reviewed in assessment of this patient.  Labs: CBC: Recent Labs  Lab 01/06/24 0354 01/07/24 0349  WBC 4.7 5.3  HGB 10.3* 10.4*  HCT 32.1* 32.4*  MCV 95.5 96.1  PLT 363 334  Basic Metabolic Panel: Recent Labs  Lab 01/06/24 0354 01/07/24 0349 01/08/24 0547 01/09/24 0410 01/10/24 0430 01/11/24 0417 01/12/24 0245  NA 130* 130*  --  127* 127*  --   --   K 4.4 4.2  --  4.3 3.9  --   --   CL 98 97*  --  104 102  --   --   CO2  24 22  --  18* 22  --   --   GLUCOSE 88 96  --  89 79  --   --   BUN 20 18  --  19 19  --   --   CREATININE 0.62 0.61  --  0.58* 0.53*  --   --   CALCIUM  9.1 9.0  --  8.4* 8.8*  --   --   MG 1.6* 1.8 1.6* 1.6* 1.4* 1.5* 1.8   Liver Function Tests: No results for input(s): AST, ALT, ALKPHOS, BILITOT, PROT, ALBUMIN  in the last 168 hours. CBG: Recent Labs  Lab 01/11/24 0831 01/11/24 1153 01/11/24 1742 01/11/24 2026 01/12/24 0819  GLUCAP 101* 96 90 123* 92    Scheduled Meds:  amiodarone   200 mg Oral BID   apixaban   5 mg Oral BID   ascorbic acid   500 mg Oral Daily   feeding supplement  237 mL Oral Q24H   Gerhardt's butt cream   Topical BID   lidocaine   1 patch Transdermal Q24H   magnesium  oxide  800 mg Oral BID   multivitamin with minerals  1 tablet Oral Daily   nutrition supplement (JUVEN)  1 packet Oral BID BM   mouth rinse  15 mL Mouth Rinse 4 times per day   pantoprazole   40 mg Oral Daily   senna-docusate  1 tablet Oral QHS   sodium chloride  flush  10-40 mL Intracatheter Q12H   sodium chloride   1 g Oral TID WC   zinc  sulfate (50mg  elemental zinc )  220 mg Oral Daily   Continuous Infusions: PRN Meds:.acetaminophen , metoprolol  tartrate, naphazoline-glycerin , OLANZapine  zydis, ondansetron  (ZOFRAN ) IV, mouth rinse  Family Communication: None at bedside  Disposition: Status is: Inpatient Remains inpatient appropriate because: Disposition     Time spent: 35 minutes  Length of inpatient stay: 35 days  Author: Carliss LELON Canales, DO 01/12/2024 11:41 AM  For on call review [email or web address].

## 2024-01-12 NOTE — Hospital Course (Addendum)
 68 y.o. male with PMH significant for homelessness, chronic alcoholism c/b withdrawals/DTs, chronic smoking, HTN, paroxysmal A-fib, h/o PE previously on Eliquis , hiatal hernia s/p Nissen fundoplication 2016, chronic low back pain, chronic anxiety, opiate withdrawal presented to the ED for confusion, low blood sugar.   During this hospitalization, he has also been seen by critical care, CHF team, GI, neurology, psychiatry and palliative care.   During this admission , had intermittent agitation- requiring Ativan  per CIWA protocol, transferred to [facility] Patient was placed on Precedex , Librium , clonazepam ... Concluded treatment Patient was seen by GI, started on stress dose steroids.  Patient was also seen by advanced heart failure team for biventricular heart failure.     He was transferred to TRH service for further management of hypoglycemia, hyponatremia.   Per psychiatry consultation, patient lacks decision-making capacity, APS involved for placement.  However this has now resolved.  APS denied guardianship.  Pt making decisions.   Currently stable for DC, pending safe disposition.   Assessment and Plan:   SIRS with hypotension - Resolved.  Completed empiric 5-day course of Zosyn .   Orthostatic hypotension - Resolved after IV fluid hydration initiation of midodrine .  Compression stockings and abdominal binder on board as well.  Midodrine  10 mg 3 times daily since 7/17.   Acute metabolic encephalopathy secondary to alcohol  withdrawal - Previously in the ICU getting Precedex .  Vitamin supplementation.  Now resolved.   Hospital delirium - Appears resolved at this time, patient back to his baseline.   Retroperitoneal hematoma/chronic iron  deficiency anemia/acute blood loss anemia - S/p 5 units total RBCs during course of hospital stay.  Hemoglobin stable.  No active bleeding.  Eliquis  resumed.   A-fib with RVR - Initially on Cardizem  drip.  Subsequently discontinued, transition to  amiodarone  p.o. Eliquis .  Rate appears well-controlled at this time.   HFrEF - EF 30-40%.  GDMT limited by hypotension.   Hyponatremia - Responding well to urea  powder.  Sodium normal.   Hypokalemia/hypomagnesemia - Supplementation on board.  Checking intermittently.   Acute urinary retention - Patient complaining of going to bathroom multiple times with only small volume of urine.  Believes he may have a problem with his prostate.  Initiated on 7/30.  Monitor response and blood pressure closely.   Goals of care - Patient was homeless, TOC working on dispo discharge disposition. Per psychiatry 6/15 the patient lacks capacity for decision-making. However, pt is now very conversive, oriented, able to make his own decisions.   APS involved however now stating they cannot pursue guardianship.  Appears patient does not want to pursue ALF and stated he would walk out if placed there. Is willing to go back to the community either boarding house or similar. Working closely with TOC on disposition planning aligning with the patient's goals.  Please refer to [facility] note.

## 2024-01-12 NOTE — Progress Notes (Signed)
 Mobility Specialist: Progress Note   01/12/24 1539  Mobility  Activity Ambulated with assistance in hallway  Level of Assistance Contact guard assist, steadying assist  Assistive Device None  Distance Ambulated (ft) 375 ft  Activity Response Tolerated well  Mobility Referral Yes  Mobility visit 1 Mobility  Mobility Specialist Start Time (ACUTE ONLY) 1400  Mobility Specialist Stop Time (ACUTE ONLY) 1412  Mobility Specialist Time Calculation (min) (ACUTE ONLY) 12 min    Pt received in bed, agreeable to mobility session. CG w/o AD. Unsteady throughout, 2x occurrence of LOB needing physical assist to correct. No complaints. Returned to room without fault. Left in bed with all needs met, call bell in reach.   Ileana Lute Mobility Specialist Please contact via SecureChat or Rehab office at [phone]

## 2024-01-12 NOTE — Plan of Care (Signed)

## 2024-01-12 NOTE — TOC Progression Note (Addendum)
 Transition of Care (TOC) - Progression Note    Patient Details  Name: Anthony A Flegal Jr. MRN: [id number] Date of Birth: 1955/12/03  Transition of Care [facility]) CM/SW Contact  Regino Fournet A [place], LCSW Phone Number: 01/12/2024, 2:50 PM  Clinical Narrative:     Update 1602: CSW was contacted by pt's sister [name] She stated that she has worked with pt about 2 years ago, was able to assist pt with finding housing, but stated that pt left the facility AMA when he was upset that he could not keep all of his funds. She stated that she has been in contact with him and helping intermittently. She said that she is unable to assist pt at this time due to personal reasons but is wiling to help from afar, however cannot assist with being his guardian. CSW was informed her that DSS are not completing guardianship at this time.  She acknowledged understanding. She provided family history of abuse and history of alcoholism in family. She said that they attempted placement a couple of years ago and once placement had been secured, pt ended up leaving AMA and that pt felt  facility was robbing me of all my money. She said that she would reach out and requested medical documentation. CSW to reach out to to [facility] leadership to coordinate an appropriate disposition plan given pt's history of refusing long term placement and DSS not agreeing to plan for guardianship.    CSW reached out to pt's sister Kyra regarding interest in pt's  care. CSW left voicemail with contact information to reach back out to CSW.   CSW then reached out to DSS social worker, [name] Harbor, [phone], he said that he met with pt at bedside this week and was able to have conversation with pt. He said he did not feel pt at this time ,met criteria to pursue guardianship. He also stated that because pt does not have a diagnosis of dementia nor has been deemed incompetent, the courts would not feel pt's rights need to be taken; which is necessary in order  to establish guardianship by the state or family. CSW noted psychiatry stating pt lacks capacity to make decisions for himself and stated guardianship is recommendation. [name] noted understanding but despite this, does not feel DSS can continue with plan for guardianship. He stated he continue to assist pt with housing support and follow him in the community once discharged. CSW provided contact information for pt's sister, Kyra, who he said he would reach out to and continue to coordinate care.   CSW also sent clinicals to [facility], [phone], [email or web address] for review for placement consideration. Male beds available. Pt's monthly social security amount potential barrier.  TOC will continue to follow.   Expected Discharge Plan: Skilled Nursing Facility Barriers to Discharge: Continued Medical Work up  Expected Discharge Plan and Services In-house Referral: Clinical Social Work     Living arrangements for the past 2 months: Homeless                                       Social Determinants of Health (SDOH) Interventions SDOH Screenings   Food Insecurity: Patient Unable To Answer (12/09/2023)  Housing: Patient Unable To Answer (12/09/2023)  Transportation Needs: Patient Unable To Answer (12/09/2023)  Utilities: Patient Unable To Answer (12/09/2023)  Depression (PHQ2-9): Medium Risk (10/16/2020)  Social Connections: Unknown (12/22/2023)  Tobacco Use: High  Risk (12/08/2023)    Readmission Risk Interventions    08/27/2023   12:21 PM 08/19/2023    9:17 AM 07/12/2023    9:58 AM  Readmission Risk Prevention Plan  Transportation Screening Complete Complete Complete  Medication Review Oceanographer) Complete Complete Complete  PCP or Specialist appointment within 3-5 days of discharge Complete  --  HRI or Home Care Consult Complete Complete Complete  SW Recovery Care/Counseling Consult Complete Complete Complete  Palliative Care Screening Not  Applicable Not Applicable Not Applicable  Skilled Nursing Facility Not Applicable Not Applicable Not Applicable

## 2024-01-12 NOTE — Progress Notes (Signed)
 Physical Therapy Treatment Patient Details Name: Anthony A Lorio Jr. MRN: [id number] DOB: 03-14-1956 Today's Date: 01/12/2024   History of Present Illness Pt is a 68 y.o. male who presented 12/06/23 with SOB. Shortly after arriving, pt became agitated and was given IV Ativan . CT head is unremarkable. Patient admitted for EtOH withdrawal, acute delirium, and hypoglycemia. Cortrak placed 5/23 now discontinued.  The day before the patient had presented to ED requesting EtOH detox; patient was deemed to be not actively in EtOH withdrawal and was discharged, however upon discharge patient threatened staff and was escorted off of the premises by [employer]. PMH includes alcohol  abuse, HTN, CVA, HFmrEF, afib, PE, HLD, anxiety, depression, chronic low back pain.    PT Comments  Patient progressing with gait sans device in hallway and working on postural stretching.  Still tangential with conversation and not aware what part of the city he is in nor how long he has been here.  He was able to remember that he had a periwick device for urinating (that leaked) and that now he is using the urinal and having no leaking.  Patient remains appropriate for skilled rehab post acute stay.  PT will continue to follow.     If plan is discharge home, recommend the following: Assistance with cooking/housework;Direct supervision/assist for medications management;Direct supervision/assist for financial management;Assist for transportation;Help with stairs or ramp for entrance;Supervision due to cognitive status;A Sainz help with walking and/or transfers;A [place] help with bathing/dressing/bathroom   Can travel by private vehicle        Equipment Recommendations  BSC/3in1;Rolling walker (2 wheels)    Recommendations for Other Services       Precautions / Restrictions Precautions Precautions: Fall Recall of Precautions/Restrictions: Impaired     Mobility  Bed Mobility Overal bed mobility: Modified Independent                   Transfers Overall transfer level: Needs assistance Equipment used: None   Sit to Stand: Supervision           General transfer comment: assist for safety    Ambulation/Gait Ambulation/Gait assistance: Supervision, Contact guard assist Gait Distance (Feet): 150 Feet (x 2) Assistive device: None (wall rail) Gait Pattern/deviations: Shuffle, Trunk flexed, Drifts right/left, Decreased stride length       General Gait Details: cues for posture, stops to work on spinal stretching though continuous with standing during session; using wall rail at times for support and needing cues throughout for direction and to find his room   Stairs             Wheelchair Mobility     Tilt Bed    Modified Rankin (Stroke Patients Only)       Balance Overall balance assessment: Needs assistance   Sitting balance-Leahy Scale: Good       Standing balance-Leahy Scale: Good Standing balance comment: walking without device in hallway though frequent use of wall rail for balance                            Communication Communication Communication: No apparent difficulties  Cognition Arousal: Alert Behavior During Therapy: WFL for tasks assessed/performed   PT - Cognitive impairments: Safety/Judgement, Awareness, Memory                       PT - Cognition Comments: initial confustion about location stating could walk to bus stop at [place] and states only  laying in the bed a few days Following commands: Intact      Cueing    Exercises Other Exercises Other Exercises: standing strech to spine holding ral and rocking forward (trunk extension) and backward (flexion) completed about 3 reps and stated if he does it too much he won't be able to do anything tomorrow Other Exercises: standing with hands on wall sliding arms up on wall and encouraged to look to ceiling x 3 reps    General Comments        Pertinent Vitals/Pain Pain  Assessment Pain Score: 7  Pain Location: back Pain Descriptors / Indicators: Aching, Sore Pain Intervention(s): Monitored during session, Repositioned (stretching)    Home Living                          Prior Function            PT Goals (current goals can now be found in the care plan section) Progress towards PT goals: Progressing toward goals    Frequency    Min 2X/week      PT Plan      Co-evaluation              AM-PAC PT 6 Clicks Mobility   Outcome Measure  Help needed turning from your back to your side while in a flat bed without using bedrails?: None Help needed moving from lying on your back to sitting on the side of a flat bed without using bedrails?: None Help needed moving to and from a bed to a chair (including a wheelchair)?: A [place] Help needed standing up from a chair using your arms (e.g., wheelchair or bedside chair)?: A [place] Help needed to walk in hospital room?: A [place] Help needed climbing 3-5 steps with a railing? : A [place] 6 Click Score: 20    End of Session Equipment Utilized During Treatment: Gait belt Activity Tolerance: Patient tolerated treatment well Patient left: in bed;with call bell/phone within reach;with bed alarm set   PT Visit Diagnosis: Other abnormalities of gait and mobility (R26.89);Other symptoms and signs involving the nervous system (R29.898)     Time: 8389-8371 PT Time Calculation (min) (ACUTE ONLY): 18 min  Charges:    $Gait Training: 8-22 mins PT General Charges $$ ACUTE PT VISIT: 1 Visit                     Micheline Portal, PT Acute Rehabilitation Services [phone] 01/12/2024    Montie Portal 01/12/2024, 5:05 PM

## 2024-01-13 DIAGNOSIS — D539 Nutritional anemia, unspecified: Secondary | ICD-10-CM | POA: Diagnosis not present

## 2024-01-13 DIAGNOSIS — I10 Essential (primary) hypertension: Secondary | ICD-10-CM | POA: Diagnosis not present

## 2024-01-13 DIAGNOSIS — I480 Paroxysmal atrial fibrillation: Secondary | ICD-10-CM | POA: Diagnosis not present

## 2024-01-13 DIAGNOSIS — E162 Hypoglycemia, unspecified: Secondary | ICD-10-CM | POA: Diagnosis not present

## 2024-01-13 LAB — BASIC METABOLIC PANEL WITH GFR
Anion gap: 11 (ref 5–15)
BUN: 29 mg/dL — ABNORMAL HIGH (ref 8–23)
CO2: 20 mmol/L — ABNORMAL LOW (ref 22–32)
Calcium: 9.4 mg/dL (ref 8.9–10.3)
Chloride: 99 mmol/L (ref 98–111)
Creatinine, Ser: 0.59 mg/dL — ABNORMAL LOW (ref 0.61–1.24)
GFR, Estimated: >60 mL/min (ref >60)
Glucose, Bld: 92 mg/dL (ref 70–99)
Potassium: 4.1 mmol/L (ref 3.5–5.1)
Sodium: 130 mmol/L — ABNORMAL LOW (ref 135–145)

## 2024-01-13 LAB — CBC
HCT: 34.4 % — ABNORMAL LOW (ref 39.0–52.0)
Hemoglobin: 11 g/dL — ABNORMAL LOW (ref 13.0–17.0)
MCH: 30.7 pg (ref 26.0–34.0)
MCHC: 32 g/dL (ref 30.0–36.0)
MCV: 96.1 fL (ref 80.0–100.0)
Platelets: 263 10*3/uL (ref 150–400)
RBC: 3.58 MIL/uL — ABNORMAL LOW (ref 4.22–5.81)
RDW: 19.3 % — ABNORMAL HIGH (ref 11.5–15.5)
WBC: 5.1 10*3/uL (ref 4.0–10.5)
nRBC: 0 % (ref 0.0–0.2)

## 2024-01-13 LAB — MAGNESIUM: Magnesium: 1.5 mg/dL — ABNORMAL LOW (ref 1.7–2.4)

## 2024-01-13 LAB — GLUCOSE, CAPILLARY
Glucose-Capillary: 128 mg/dL — ABNORMAL HIGH (ref 70–99)
Glucose-Capillary: 119 mg/dL — ABNORMAL HIGH (ref 70–99)

## 2024-01-13 MED ORDER — MAGNESIUM SULFATE 2 GM/50ML IV SOLN
2.0000 g | Freq: Once | INTRAVENOUS | Status: AC
  Administered 2024-01-13: 2 g via INTRAVENOUS
  Filled 2024-01-13: qty 50

## 2024-01-13 NOTE — Progress Notes (Signed)
 Mobility Specialist: Progress Note   01/13/24 1249  Mobility  Activity Ambulated with assistance in hallway  Level of Assistance Standby assist, set-up cues, supervision of patient - no hands on  Assistive Device Front wheel walker  Distance Ambulated (ft) 375 ft  Activity Response Tolerated well  Mobility Referral Yes  Mobility visit 1 Mobility  Mobility Specialist Start Time (ACUTE ONLY) 1050  Mobility Specialist Stop Time (ACUTE ONLY) 1102  Mobility Specialist Time Calculation (min) (ACUTE ONLY) 12 min    Pt received in bed, agreeable to mobility session. Feeling a [place] weaker and SOB today, opted to use RW this session. SpO2 100% on RA. Pt a [place] frustrated this session and made comments about not understanding the point of ambulating with therapy while he's here. Also stated that he didn't care to do any of it. Returned to room without fault. Left in bed with all needs met, call bell in reach.    Ileana Lute Mobility Specialist Please contact via SecureChat or Rehab office at [phone]

## 2024-01-13 NOTE — Care Management Important Message (Signed)
 Important Message  Patient Details  Name: Anthony A Lichty Jr. MRN: [id number] Date of Birth: 04/11/1956   Important Message Given:  Yes - Medicare IM     Claretta Deed 01/13/2024, 12:37 PM

## 2024-01-13 NOTE — Progress Notes (Signed)
 Progress Note   Patient: Anthony A Hoots Jr. FMW:[id number] DOB: Mar 16, 1956 DOA: 12/07/2023  DOS: the patient was seen and examined on 01/13/2024   Brief hospital course:  68 y.o. male with PMH significant for homelessness, chronic alcoholism c/b withdrawals/DTs, chronic smoking, HTN, paroxysmal A-fib, h/o PE previously on Eliquis , hiatal hernia s/p Nissen fundoplication 2016, chronic low back pain, chronic anxiety, opiate withdrawal presented to the ED for confusion, low blood sugar.   During this hospitalization, he has also been seen by critical care, CHF team, GI, neurology, psychiatry and palliative care.   During this admission , had intermittent agitation- requiring Ativan  per CIWA protocol, transferred to [facility] Patient was placed on Precedex , Librium , clonazepam ... Concluded treatment Patient was seen by GI, started on stress dose steroids.  Patient was also seen by advanced heart failure team for biventricular heart failure.     He was transferred to TRH service for further management of hypoglycemia, hyponatremia.   Per psychiatry consultation, patient lacks decision-making capacity, APS involved for placement.   Subsequently stable for DC, pending placement.   Assessment and Plan:   SIRS with hypotension - Resolved.  Completed empiric 5-day course of Zosyn .   Acute metabolic encephalopathy secondary to alcohol  withdrawal - Previously in the ICU getting Precedex .  Vitamin supplementation.  Now resolved.   Hospital delirium - Likely underlying Warnicke.  Appears resolved at this time, patient back to his baseline.   Retroperitoneal hematoma/chronic iron  deficiency anemia/acute blood loss anemia - S/p 5 units total RBCs during course of hospital stay.  Hemoglobin stable.  No active bleeding.  Eliquis  resumed.   A-fib with RVR - Initially on Cardizem  drip.  Subsequently discontinued, transition to amiodarone  p.o. Eliquis .  Rate appears well-controlled at this time.   HFrEF -  EF 30-40%.  GDMT limited by hypotension.   Hyponatremia - Somewhat persistent despite sodium supplementation/IV fluids.  Initiated on urea powder.  Serum sodium 130 this morning.  Recheck BMP in AM.   Hypokalemia/hypomagnesemia - Supplementation on board.  Magnesium  level this morning.  Will order 2 g IV once.  Will recheck BMP and magnesium  in AM.   Goals of care - Patient is DNR.  Patient was homeless, TOC working on dispo discharge disposition.  APS involved.  Per psychiatry 6/15 the patient lacks capacity for decision-making.   Subjective: Patient resting comfortably this morning.  No acute events overnight.  Denies any shortness of breath, fever, chills, chest pain, nausea, vomiting, abdominal pain.  Physical Exam:  Vitals:   01/12/24 0824 01/12/24 2007 01/13/24 0500 01/13/24 0517  BP: (!) 111/91 110/79  116/87  Pulse: 98 100  94  Resp: 18 17  17   Temp: 98.2 F (36.8 C) 98.2 F (36.8 C)  98 F (36.7 C)  TempSrc:  Oral  Oral  SpO2: 97% 99%  99%  Weight:   65.5 kg   Height:        GENERAL:  Alert, pleasant, no acute distress, disheveled HEENT:  EOMI CARDIOVASCULAR:  RRR, no murmurs appreciated RESPIRATORY:  Clear to auscultation, no wheezing, rales, or rhonchi GASTROINTESTINAL:  Soft, nontender, nondistended EXTREMITIES:  No LE edema bilaterally NEURO:  No new focal deficits appreciated SKIN:  No rashes noted PSYCH:  Appropriate mood and affect     Data Reviewed:  No new imaging to review  Previous records (including but not limited to H&P, progress notes, nursing notes, TOC management) were reviewed in assessment of this patient.  Labs: CBC: Recent Labs  Lab 01/07/24 0349 01/13/24  0258  WBC 5.3 5.1  HGB 10.4* 11.0*  HCT 32.4* 34.4*  MCV 96.1 96.1  PLT 334 263   Basic Metabolic Panel: Recent Labs  Lab 01/07/24 0349 01/08/24 0547 01/09/24 0410 01/10/24 0430 01/11/24 0417 01/12/24 0245 01/13/24 0258  NA 130*  --  127* 127*  --   --  130*  K 4.2   --  4.3 3.9  --   --  4.1  CL 97*  --  104 102  --   --  99  CO2 22  --  18* 22  --   --  20*  GLUCOSE 96  --  89 79  --   --  92  BUN 18  --  19 19  --   --  29*  CREATININE 0.61  --  0.58* 0.53*  --   --  0.59*  CALCIUM  9.0  --  8.4* 8.8*  --   --  9.4  MG 1.8   < > 1.6* 1.4* 1.5* 1.8 1.5*   < > = values in this interval not displayed.   Liver Function Tests: No results for input(s): AST, ALT, ALKPHOS, BILITOT, PROT, ALBUMIN  in the last 168 hours. CBG: Recent Labs  Lab 01/11/24 2026 01/12/24 0819 01/12/24 1226 01/12/24 1646 01/12/24 2007  GLUCAP 123* 92 106* 86 91    Scheduled Meds:  amiodarone   200 mg Oral BID   apixaban   5 mg Oral BID   ascorbic acid   500 mg Oral Daily   feeding supplement  237 mL Oral Q24H   Gerhardt's butt cream   Topical BID   lidocaine   1 patch Transdermal Q24H   magnesium  oxide  800 mg Oral BID   multivitamin with minerals  1 tablet Oral Daily   nutrition supplement (JUVEN)  1 packet Oral BID BM   mouth rinse  15 mL Mouth Rinse 4 times per day   pantoprazole   40 mg Oral Daily   senna-docusate  1 tablet Oral QHS   sodium chloride  flush  10-40 mL Intracatheter Q12H   sodium chloride   1 g Oral TID WC   urea  15 g Oral BID   zinc  sulfate (50mg  elemental zinc )  220 mg Oral Daily   Continuous Infusions: PRN Meds:.acetaminophen , metoprolol  tartrate, naphazoline-glycerin , OLANZapine  zydis, ondansetron  (ZOFRAN ) IV, mouth rinse  Family Communication: None at bedside  Disposition: Status is: Inpatient Remains inpatient appropriate because: Disposition     Time spent: 30 minutes  Length of inpatient stay: 36 days  Author: Carliss LELON Canales, DO 01/13/2024 10:06 AM  For on call review [email or web address].

## 2024-01-13 NOTE — Progress Notes (Signed)
 Assumed care at 0700, pt lying in bed with eyes close, even unlabored respirations, skin warm and dry to the touch, pt up and out of bed this shift, walked with therapy in the hallway, pt's left heel completed closed, appear to be no drainage to back of heel, call bell within reach and safety measures are in place.

## 2024-01-13 NOTE — Progress Notes (Signed)
 Physical Therapy Treatment Patient Details Name: Anthony A Karan Jr. MRN: [id number] DOB: 1956/01/19 Today's Date: 01/13/2024   History of Present Illness Pt is a 68 y.o. male who presented 12/06/23 with SOB. Shortly after arriving, pt became agitated and was given IV Ativan . CT head is unremarkable. Patient admitted for EtOH withdrawal, acute delirium, and hypoglycemia. Cortrak placed 5/23 now discontinued.  The day before the patient had presented to ED requesting EtOH detox; patient was deemed to be not actively in EtOH withdrawal and was discharged, however upon discharge patient threatened staff and was escorted off of the premises by [employer]. PMH includes alcohol  abuse, HTN, CVA, HFmrEF, afib, PE, HLD, anxiety, depression, chronic low back pain.    PT Comments  Patient with increased flexed posture this date needing cues for forward attention to avoid obstacles.  Also able to complete toilet hygiene with set up and cues.  Patient declined standing balance activities at sink though needing min A for LOB x 1 with scissor steps to recover.  Continues to need skilled PT.  Will continue to follow.     If plan is discharge home, recommend the following: Assistance with cooking/housework;Direct supervision/assist for medications management;Direct supervision/assist for financial management;Assist for transportation;Help with stairs or ramp for entrance;Supervision due to cognitive status;A Trost help with walking and/or transfers;A Cloke help with bathing/dressing/bathroom   Can travel by private vehicle     Yes  Equipment Recommendations  BSC/3in1;Rolling walker (2 wheels)    Recommendations for Other Services       Precautions / Restrictions Precautions Precautions: Fall Recall of Precautions/Restrictions: Impaired     Mobility  Bed Mobility Overal bed mobility: Modified Independent                  Transfers Overall transfer level: Needs assistance Equipment used:  None Transfers: Sit to/from Stand Sit to Stand: Supervision, Contact guard assist           General transfer comment: some imbalance with transfers at times occasional CGA needed and environmental set up for safety    Ambulation/Gait Ambulation/Gait assistance: Min assist, Contact guard assist, Supervision Gait Distance (Feet): 150 Feet (x 2) Assistive device: None (occasional wall rail use) Gait Pattern/deviations: Trunk flexed, Step-through pattern, Scissoring, Shuffle       General Gait Details: occasional LOB; flexed posture throughout with cues for head up and forward attention to avoid obstacles/people; min A to rebalance with scissoring x 1;   Stairs             Wheelchair Mobility     Tilt Bed    Modified Rankin (Stroke Patients Only)       Balance Overall balance assessment: Needs assistance   Sitting balance-Leahy Scale: Good Sitting balance - Comments: toileted with set up completed hygiene no LOB     Standing balance-Leahy Scale: Good Standing balance comment: static balance without issues, CGA for some dynamic tasks                            Communication Communication Communication: No apparent difficulties  Cognition Arousal: Alert Behavior During Therapy: WFL for tasks assessed/performed   PT - Cognitive impairments: Safety/Judgement, Awareness, Memory                         Following commands: Intact Following commands impaired: Follows one step commands with increased time    Cueing    Exercises Other Exercises  Other Exercises: declined standing balance activities at sink today    General Comments        Pertinent Vitals/Pain Pain Assessment Pain Assessment: Faces Faces Pain Scale: Hurts Minerva more Pain Location: back Pain Descriptors / Indicators: Aching, Sore Pain Intervention(s): Monitored during session    Home Living                          Prior Function            PT  Goals (current goals can now be found in the care plan section) Progress towards PT goals: Progressing toward goals    Frequency    Min 2X/week      PT Plan      Co-evaluation              AM-PAC PT 6 Clicks Mobility   Outcome Measure  Help needed turning from your back to your side while in a flat bed without using bedrails?: None Help needed moving from lying on your back to sitting on the side of a flat bed without using bedrails?: None Help needed moving to and from a bed to a chair (including a wheelchair)?: A Davidow Help needed standing up from a chair using your arms (e.g., wheelchair or bedside chair)?: A Wadas Help needed to walk in hospital room?: A Harts Help needed climbing 3-5 steps with a railing? : A Ivanoff 6 Click Score: 20    End of Session Equipment Utilized During Treatment: Gait belt Activity Tolerance: Patient tolerated treatment well Patient left: in bed;with call bell/phone within reach;with bed alarm set   PT Visit Diagnosis: Other abnormalities of gait and mobility (R26.89);Other symptoms and signs involving the nervous system (R29.898)     Time: 8379-8352 PT Time Calculation (min) (ACUTE ONLY): 27 min  Charges:    $Gait Training: 8-22 mins $Therapeutic Activity: 8-22 mins PT General Charges $$ ACUTE PT VISIT: 1 Visit                     Micheline [name], PT Acute Rehabilitation Services Office:[phone] 01/13/2024    Anthony [name] 01/13/2024, 6:09 PM

## 2024-01-14 DIAGNOSIS — E162 Hypoglycemia, unspecified: Secondary | ICD-10-CM | POA: Diagnosis not present

## 2024-01-14 DIAGNOSIS — E871 Hypo-osmolality and hyponatremia: Secondary | ICD-10-CM | POA: Diagnosis not present

## 2024-01-14 DIAGNOSIS — I10 Essential (primary) hypertension: Secondary | ICD-10-CM | POA: Diagnosis not present

## 2024-01-14 DIAGNOSIS — D539 Nutritional anemia, unspecified: Secondary | ICD-10-CM | POA: Diagnosis not present

## 2024-01-14 LAB — GLUCOSE, CAPILLARY
Glucose-Capillary: 115 mg/dL — ABNORMAL HIGH (ref 70–99)
Glucose-Capillary: 93 mg/dL (ref 70–99)
Glucose-Capillary: 144 mg/dL — ABNORMAL HIGH (ref 70–99)

## 2024-01-14 LAB — BASIC METABOLIC PANEL WITH GFR
Anion gap: 10 (ref 5–15)
BUN: 39 mg/dL — ABNORMAL HIGH (ref 8–23)
CO2: 21 mmol/L — ABNORMAL LOW (ref 22–32)
Calcium: 9.5 mg/dL (ref 8.9–10.3)
Chloride: 101 mmol/L (ref 98–111)
Creatinine, Ser: 0.78 mg/dL (ref 0.61–1.24)
GFR, Estimated: >60 mL/min (ref >60)
Glucose, Bld: 97 mg/dL (ref 70–99)
Potassium: 3.9 mmol/L (ref 3.5–5.1)
Sodium: 132 mmol/L — ABNORMAL LOW (ref 135–145)

## 2024-01-14 LAB — MAGNESIUM: Magnesium: 1.6 mg/dL — ABNORMAL LOW (ref 1.7–2.4)

## 2024-01-14 NOTE — Progress Notes (Signed)
 OT Cancellation Note  Patient Details Name: Anthony A Helfman Jr. MRN: [id number] DOB: Sep 10, 1955   Cancelled Treatment:    Reason Eval/Treat Not Completed: Patient declined, no reason specified Pt declined to work with OT x 3 this AM citing multiple reasons (very busy, need to make calls, etc). On third attempt, checked back at pt's requested time though still declined to work with OT.   Mliss Fish 01/14/2024, 10:04 AM

## 2024-01-14 NOTE — Progress Notes (Addendum)
 Transition of Care [facility]) - Inpatient Brief Assessment   Patient Details  Name: Anthony A Knights Jr. MRN: [id number] Date of Birth: [birth year]  [facility] CM/SW Contact:    Rosaline JONELLE Joe, RN Phone Number: 01/14/2024, 9:49 AM   Clinical Narrative: CM met with the patient at the bedside to discuss patient's options for housing.  APS has declined seeking guardianship for this patient.  Patient was homeless prior to admission to the hospital and declines ALF placement.  Patient was provided with [facility] list of available rooms at a boarding house today to place calls to find a house that will accept him.  Patient needs RW.  Patient does not have preference for DME company.  I called [employer] and requested delivery of RW to the bedside today.  DME order was placed to be co-signed by the MD.  Patient has a bookbag of belongings in the room.  If patient is able to find a boarding house - transportation assistance will be provided.  Patient states that he is active with [facility] and plans to return to the office for hospital follow up.  CM and MSW will continue to follow the patient for safe disposition plan.  01/14/2024 1412 - I called [facility] and spoke with Juliene and provided him with patient's hospital room number to call and speak with the patient to set him up with possible boarding house placement by next week.  Adam. CM with [facility] will call the patient in his hospital room and speak with him today and tomorrow to discuss boarding house possibility.   Transition of Care Asessment: Insurance and Status: (P) Insurance coverage has been reviewed Patient has primary care physician: (P) Yes Home environment has been reviewed: (P) Homeless Prior level of function:: (P) Independent Prior/Current Home Services: (P) No current home services Social Drivers of Health Review: (P) SDOH reviewed needs interventions Readmission risk has been reviewed: (P)  Yes Transition of care needs: (P) transition of care needs identified, TOC will continue to follow

## 2024-01-14 NOTE — Progress Notes (Signed)
 Progress Note   Patient: Anthony A Norsworthy Jr. FMW:[id number] DOB: 1955/10/21 DOA: 12/07/2023  DOS: the patient was seen and examined on 01/14/2024   Brief hospital course:  68 y.o. male with PMH significant for homelessness, chronic alcoholism c/b withdrawals/DTs, chronic smoking, HTN, paroxysmal A-fib, h/o PE previously on Eliquis , hiatal hernia s/p Nissen fundoplication 2016, chronic low back pain, chronic anxiety, opiate withdrawal presented to the ED for confusion, low blood sugar.   During this hospitalization, he has also been seen by critical care, CHF team, GI, neurology, psychiatry and palliative care.   During this admission , had intermittent agitation- requiring Ativan  per CIWA protocol, transferred to [facility] Patient was placed on Precedex , Librium , clonazepam ... Concluded treatment Patient was seen by GI, started on stress dose steroids.  Patient was also seen by advanced heart failure team for biventricular heart failure.     He was transferred to TRH service for further management of hypoglycemia, hyponatremia.   Per psychiatry consultation, patient lacks decision-making capacity, APS involved for placement.   Subsequently stable for DC, pending placement.   Assessment and Plan:   SIRS with hypotension - Resolved.  Completed empiric 5-day course of Zosyn .   Acute metabolic encephalopathy secondary to alcohol  withdrawal - Previously in the ICU getting Precedex .  Vitamin supplementation.  Now resolved.   Hospital delirium - Likely underlying Warnicke.  Appears resolved at this time, patient back to his baseline.   Retroperitoneal hematoma/chronic iron  deficiency anemia/acute blood loss anemia - S/p 5 units total RBCs during course of hospital stay.  Hemoglobin stable.  No active bleeding.  Eliquis  resumed.   A-fib with RVR - Initially on Cardizem  drip.  Subsequently discontinued, transition to amiodarone  p.o. Eliquis .  Rate appears well-controlled at this time.   HFrEF -  EF 30-40%.  GDMT limited by hypotension.   Hyponatremia - Somewhat persistent despite sodium supplementation/IV fluids.  Initiated on urea  powder 6/25.  Serum sodium 132 this morning.  Recheck BMP in AM.   Hypokalemia/hypomagnesemia - Supplementation on board.  Will recheck BMP and magnesium  in AM.   Goals of care - Patient is DNR.  Patient was homeless, TOC working on dispo discharge disposition.  APS involved however now stating they cannot pursue guardianship.  Per psychiatry 6/15 the patient lacks capacity for decision-making.  However, pt is now very conversive, oriented, able to make his own decisions.  Appears patient does not want to pursue ALF and stated he would walk out if placed there.  Is willing to go back to the community either aboard house or similar.  Working closely with TOC on disposition planning aligning with the patient's goals.   Subjective: Patient resting comfortably this morning.  Has no acute complaints.  When asked about living at ALF, patient states that he does not not get along well with that lifestyle, would not leave if transition to 1.  His comfortable with being discharged to the community at a boardinghouse or shelter if arrangements could be provided.  Currently denies any fever, shortness of breath, nausea, vomiting, abdominal pain.  Physical Exam:  Vitals:   01/13/24 2135 01/14/24 0442 01/14/24 0443 01/14/24 0818  BP: 119/82 103/80  94/75  Pulse: (!) 102 (!) 103  100  Resp: 18 18  17   Temp: 98 F (36.7 C) 98.5 F (36.9 C)  97.6 F (36.4 C)  TempSrc: Oral Oral  Oral  SpO2: 100% 99%  99%  Weight:   64.4 kg   Height:  GENERAL:  Alert, pleasant, no acute distress, disheveled HEENT:  EOMI CARDIOVASCULAR:  RRR, no murmurs appreciated RESPIRATORY:  Clear to auscultation, no wheezing, rales, or rhonchi GASTROINTESTINAL:  Soft, nontender, nondistended EXTREMITIES:  No LE edema bilaterally NEURO:  No new focal deficits appreciated SKIN:  No  rashes noted PSYCH:  Appropriate mood and affect    Data Reviewed:  No new imaging to review  Previous records (including but not limited to H&P, progress notes, nursing notes, TOC management) were reviewed in assessment of this patient.  Labs: CBC: Recent Labs  Lab 01/13/24 0258  WBC 5.1  HGB 11.0*  HCT 34.4*  MCV 96.1  PLT 263   Basic Metabolic Panel: Recent Labs  Lab 01/09/24 0410 01/10/24 0430 01/11/24 0417 01/12/24 0245 01/13/24 0258 01/14/24 0313  NA 127* 127*  --   --  130* 132*  K 4.3 3.9  --   --  4.1 3.9  CL 104 102  --   --  99 101  CO2 18* 22  --   --  20* 21*  GLUCOSE 89 79  --   --  92 97  BUN 19 19  --   --  29* 39*  CREATININE 0.58* 0.53*  --   --  0.59* 0.78  CALCIUM  8.4* 8.8*  --   --  9.4 9.5  MG 1.6* 1.4* 1.5* 1.8 1.5* 1.6*   Liver Function Tests: No results for input(s): AST, ALT, ALKPHOS, BILITOT, PROT, ALBUMIN  in the last 168 hours. CBG: Recent Labs  Lab 01/12/24 2007 01/13/24 1716 01/13/24 2133 01/14/24 0817 01/14/24 1144  GLUCAP 91 128* 119* 115* 93    Scheduled Meds:  amiodarone   200 mg Oral BID   apixaban   5 mg Oral BID   ascorbic acid   500 mg Oral Daily   feeding supplement  237 mL Oral Q24H   Gerhardt's butt cream   Topical BID   lidocaine   1 patch Transdermal Q24H   magnesium  oxide  800 mg Oral BID   multivitamin with minerals  1 tablet Oral Daily   nutrition supplement (JUVEN)  1 packet Oral BID BM   mouth rinse  15 mL Mouth Rinse 4 times per day   pantoprazole   40 mg Oral Daily   senna-docusate  1 tablet Oral QHS   sodium chloride  flush  10-40 mL Intracatheter Q12H   sodium chloride   1 g Oral TID WC   urea   15 g Oral BID   zinc  sulfate (50mg  elemental zinc )  220 mg Oral Daily   Continuous Infusions: PRN Meds:.acetaminophen , metoprolol  tartrate, naphazoline-glycerin , OLANZapine  zydis, ondansetron  (ZOFRAN ) IV, mouth rinse  Family Communication: None at bedside  Disposition: Status is:  Inpatient Remains inpatient appropriate because: Disposition     Time spent: 39 minutes  Length of inpatient stay: 37 days  Author: Carliss LELON Canales, DO 01/14/2024 12:08 PM  For on call review [email or web address].

## 2024-01-14 NOTE — Plan of Care (Signed)

## 2024-01-15 DIAGNOSIS — I480 Paroxysmal atrial fibrillation: Secondary | ICD-10-CM | POA: Diagnosis not present

## 2024-01-15 DIAGNOSIS — E162 Hypoglycemia, unspecified: Secondary | ICD-10-CM | POA: Diagnosis not present

## 2024-01-15 DIAGNOSIS — D539 Nutritional anemia, unspecified: Secondary | ICD-10-CM | POA: Diagnosis not present

## 2024-01-15 DIAGNOSIS — E871 Hypo-osmolality and hyponatremia: Secondary | ICD-10-CM | POA: Diagnosis not present

## 2024-01-15 LAB — GLUCOSE, CAPILLARY
Glucose-Capillary: 82 mg/dL (ref 70–99)
Glucose-Capillary: 108 mg/dL — ABNORMAL HIGH (ref 70–99)
Glucose-Capillary: 106 mg/dL — ABNORMAL HIGH (ref 70–99)

## 2024-01-15 LAB — BASIC METABOLIC PANEL WITH GFR
Anion gap: 9 (ref 5–15)
BUN: 34 mg/dL — ABNORMAL HIGH (ref 8–23)
CO2: 23 mmol/L (ref 22–32)
Calcium: 9.7 mg/dL (ref 8.9–10.3)
Chloride: 104 mmol/L (ref 98–111)
Creatinine, Ser: 0.65 mg/dL (ref 0.61–1.24)
GFR, Estimated: >60 mL/min (ref >60)
Glucose, Bld: 91 mg/dL (ref 70–99)
Potassium: 3.7 mmol/L (ref 3.5–5.1)
Sodium: 136 mmol/L (ref 135–145)

## 2024-01-15 LAB — MAGNESIUM: Magnesium: 1.5 mg/dL — ABNORMAL LOW (ref 1.7–2.4)

## 2024-01-15 MED ORDER — MAGNESIUM SULFATE 2 GM/50ML IV SOLN
2.0000 g | Freq: Once | INTRAVENOUS | Status: AC
  Administered 2024-01-15: 2 g via INTRAVENOUS
  Filled 2024-01-15: qty 50

## 2024-01-15 NOTE — Progress Notes (Signed)
 Progress Note   Patient: Anthony A Kaluzny Jr. FMW:[id number] DOB: [birth year] DOA: 12/07/2023  DOS: the patient was seen and examined on 01/15/2024   Brief hospital course:  68 y.o. male with PMH significant for homelessness, chronic alcoholism c/b withdrawals/DTs, chronic smoking, HTN, paroxysmal A-fib, h/o PE previously on Eliquis , hiatal hernia s/p Nissen fundoplication 2016, chronic low back pain, chronic anxiety, opiate withdrawal presented to the ED for confusion, low blood sugar.   During this hospitalization, he has also been seen by critical care, CHF team, GI, neurology, psychiatry and palliative care.   During this admission , had intermittent agitation- requiring Ativan  per CIWA protocol, transferred to PCCM Patient was placed on Precedex , Librium , clonazepam ... Concluded treatment Patient was seen by GI, started on stress dose steroids.  Patient was also seen by advanced heart failure team for biventricular heart failure.     He was transferred to TRH service for further management of hypoglycemia, hyponatremia.   Per psychiatry consultation, patient lacks decision-making capacity, APS involved for placement.   Subsequently stable for DC, pending placement.   Assessment and Plan:   SIRS with hypotension - Resolved.  Completed empiric 5-day course of Zosyn .   Acute metabolic encephalopathy secondary to alcohol  withdrawal - Previously in the ICU getting Precedex .  Vitamin supplementation.  Now resolved.   Hospital delirium - Likely underlying Warnicke.  Appears resolved at this time, patient back to his baseline.   Retroperitoneal hematoma/chronic iron  deficiency anemia/acute blood loss anemia - S/p 5 units total RBCs during course of hospital stay.  Hemoglobin stable.  No active bleeding.  Eliquis  resumed.   A-fib with RVR - Initially on Cardizem  drip.  Subsequently discontinued, transition to amiodarone  p.o. Eliquis .  Rate appears well-controlled at this time.   HFrEF -  EF 30-40%.  GDMT limited by hypotension.   Hyponatremia - Somewhat persistent despite sodium supplementation/IV fluids.  Responding well to urea  powder.  Serum sodium 136 this morning.  Recheck BMP in AM.   Hypokalemia/hypomagnesemia - Supplementation on board.  Will recheck BMP and magnesium  in AM.   Goals of care - Patient is DNR.  Patient was homeless, TOC working on dispo discharge disposition.  APS involved however now stating they cannot pursue guardianship.  Per psychiatry 6/15 the patient lacks capacity for decision-making.  However, pt is now very conversive, oriented, able to make his own decisions.  Appears patient does not want to pursue ALF and stated he would walk out if placed there.  Is willing to go back to the community either aboard house or similar.  Working closely with TOC on disposition planning aligning with the patient's goals.   Subjective: Patient resting comfortably this morning.  Attempting to call local shelters in such to help with disposition planning.  Eager to take shower.  Otherwise no complaints.  Denies fever, chills, shortness of breath, nausea, vomiting, abdominal pain.  Requesting eyedrops.  Physical Exam:  Vitals:   01/14/24 2027 01/14/24 2042 01/15/24 0342 01/15/24 0823  BP: 109/71 110/74 107/74 (!) 88/66  Pulse: (!) 105 (!) 105 (!) 107 95  Resp: 20 19 18    Temp: 98.3 F (36.8 C) 98.1 F (36.7 C) 98 F (36.7 C)   TempSrc:   Oral   SpO2: 99% 98% 98% 100%  Weight:      Height:        GENERAL:  Alert, pleasant, no acute distress, disheveled HEENT:  EOMI CARDIOVASCULAR:  RRR, no murmurs appreciated RESPIRATORY:  Clear to auscultation, no wheezing, rales, or  rhonchi GASTROINTESTINAL:  Soft, nontender, nondistended EXTREMITIES:  No LE edema bilaterally NEURO:  No new focal deficits appreciated SKIN:  No rashes noted PSYCH:  Appropriate mood and affect    Data Reviewed:  No new imaging to review  Previous records (including but not  limited to H&P, progress notes, nursing notes, TOC management) were reviewed in assessment of this patient.  Labs: CBC: Recent Labs  Lab 01/13/24 0258  WBC 5.1  HGB 11.0*  HCT 34.4*  MCV 96.1  PLT 263   Basic Metabolic Panel: Recent Labs  Lab 01/09/24 0410 01/10/24 0430 01/11/24 0417 01/12/24 0245 01/13/24 0258 01/14/24 0313 01/15/24 0412  NA 127* 127*  --   --  130* 132* 136  K 4.3 3.9  --   --  4.1 3.9 3.7  CL 104 102  --   --  99 101 104  CO2 18* 22  --   --  20* 21* 23  GLUCOSE 89 79  --   --  92 97 91  BUN 19 19  --   --  29* 39* 34*  CREATININE 0.58* 0.53*  --   --  0.59* 0.78 0.65  CALCIUM  8.4* 8.8*  --   --  9.4 9.5 9.7  MG 1.6* 1.4* 1.5* 1.8 1.5* 1.6* 1.5*   Liver Function Tests: No results for input(s): AST, ALT, ALKPHOS, BILITOT, PROT, ALBUMIN  in the last 168 hours. CBG: Recent Labs  Lab 01/14/24 0817 01/14/24 1144 01/14/24 1733 01/15/24 0822 01/15/24 1158  GLUCAP 115* 93 144* 82 108*    Scheduled Meds:  amiodarone   200 mg Oral BID   apixaban   5 mg Oral BID   ascorbic acid   500 mg Oral Daily   feeding supplement  237 mL Oral Q24H   Gerhardt's butt cream   Topical BID   lidocaine   1 patch Transdermal Q24H   multivitamin with minerals  1 tablet Oral Daily   nutrition supplement (JUVEN)  1 packet Oral BID BM   mouth rinse  15 mL Mouth Rinse 4 times per day   pantoprazole   40 mg Oral Daily   senna-docusate  1 tablet Oral QHS   sodium chloride  flush  10-40 mL Intracatheter Q12H   sodium chloride   1 g Oral TID WC   urea   15 g Oral BID   zinc  sulfate (50mg  elemental zinc )  220 mg Oral Daily   Continuous Infusions: PRN Meds:.acetaminophen , metoprolol  tartrate, naphazoline-glycerin , OLANZapine  zydis, ondansetron  (ZOFRAN ) IV, mouth rinse  Family Communication: Family at bedside  Disposition: Status is: Inpatient Remains inpatient appropriate because: Disposition planning     Time spent: 33 minutes  Length of inpatient stay: 38  days  Author: Carliss LELON Canales, DO 01/15/2024 12:25 PM  For on call review [email or web address].

## 2024-01-15 NOTE — Plan of Care (Signed)

## 2024-01-16 DIAGNOSIS — E440 Moderate protein-calorie malnutrition: Secondary | ICD-10-CM | POA: Diagnosis not present

## 2024-01-16 DIAGNOSIS — E871 Hypo-osmolality and hyponatremia: Secondary | ICD-10-CM | POA: Diagnosis not present

## 2024-01-16 LAB — CBC
HCT: 38 % — ABNORMAL LOW (ref 39.0–52.0)
Hemoglobin: 12.4 g/dL — ABNORMAL LOW (ref 13.0–17.0)
MCH: 32 pg (ref 26.0–34.0)
MCHC: 32.6 g/dL (ref 30.0–36.0)
MCV: 98.2 fL (ref 80.0–100.0)
Platelets: 308 10*3/uL (ref 150–400)
RBC: 3.87 MIL/uL — ABNORMAL LOW (ref 4.22–5.81)
RDW: 19.2 % — ABNORMAL HIGH (ref 11.5–15.5)
WBC: 5.8 10*3/uL (ref 4.0–10.5)
nRBC: 0 % (ref 0.0–0.2)

## 2024-01-16 LAB — GLUCOSE, CAPILLARY
Glucose-Capillary: 96 mg/dL (ref 70–99)
Glucose-Capillary: 102 mg/dL — ABNORMAL HIGH (ref 70–99)
Glucose-Capillary: 129 mg/dL — ABNORMAL HIGH (ref 70–99)

## 2024-01-16 LAB — BASIC METABOLIC PANEL WITH GFR
Anion gap: 10 (ref 5–15)
BUN: 38 mg/dL — ABNORMAL HIGH (ref 8–23)
CO2: 24 mmol/L (ref 22–32)
Calcium: 9.4 mg/dL (ref 8.9–10.3)
Chloride: 99 mmol/L (ref 98–111)
Creatinine, Ser: 0.59 mg/dL — ABNORMAL LOW (ref 0.61–1.24)
GFR, Estimated: >60 mL/min (ref >60)
Glucose, Bld: 89 mg/dL (ref 70–99)
Potassium: 4 mmol/L (ref 3.5–5.1)
Sodium: 133 mmol/L — ABNORMAL LOW (ref 135–145)

## 2024-01-16 LAB — MAGNESIUM: Magnesium: 1.6 mg/dL — ABNORMAL LOW (ref 1.7–2.4)

## 2024-01-16 MED ORDER — MAGNESIUM SULFATE 2 GM/50ML IV SOLN
2.0000 g | Freq: Once | INTRAVENOUS | Status: AC
  Administered 2024-01-16: 2 g via INTRAVENOUS
  Filled 2024-01-16: qty 50

## 2024-01-16 NOTE — Progress Notes (Addendum)
 Progress Note   Patient: Anthony A Palladino Jr. FMW:[id number] DOB: 07/29/1955 DOA: 12/07/2023  DOS: the patient was seen and examined on 01/16/2024   Brief hospital course:  68 y.o. male with PMH significant for homelessness, chronic alcoholism c/b withdrawals/DTs, chronic smoking, HTN, paroxysmal A-fib, h/o PE previously on Eliquis , hiatal hernia s/p Nissen fundoplication 2016, chronic low back pain, chronic anxiety, opiate withdrawal presented to the ED for confusion, low blood sugar.   During this hospitalization, he has also been seen by critical care, CHF team, GI, neurology, psychiatry and palliative care.   During this admission , had intermittent agitation- requiring Ativan  per CIWA protocol, transferred to PCCM Patient was placed on Precedex , Librium , clonazepam ... Concluded treatment Patient was seen by GI, started on stress dose steroids.  Patient was also seen by advanced heart failure team for biventricular heart failure.     He was transferred to TRH service for further management of hypoglycemia, hyponatremia.   Per psychiatry consultation, patient lacks decision-making capacity, APS involved for placement.   Subsequently stable for DC, pending placement.   Assessment and Plan:   SIRS with hypotension - Resolved.  Completed empiric 5-day course of Zosyn .   Acute metabolic encephalopathy secondary to alcohol  withdrawal - Previously in the ICU getting Precedex .  Vitamin supplementation.  Now resolved.   Hospital delirium - Likely underlying Warnicke.  Appears resolved at this time, patient back to his baseline.   Retroperitoneal hematoma/chronic iron  deficiency anemia/acute blood loss anemia - S/p 5 units total RBCs during course of hospital stay.  Hemoglobin stable.  No active bleeding.  Eliquis  resumed.   A-fib with RVR - Initially on Cardizem  drip.  Subsequently discontinued, transition to amiodarone  p.o. Eliquis .  Rate appears well-controlled at this time.   HFrEF -  EF 30-40%.  GDMT limited by hypotension.   Hyponatremia - Somewhat persistent despite sodium supplementation/IV fluids.  Responding well to urea  powder.  Serum sodium 133 this morning.  Recheck BMP in AM.   Hypokalemia/hypomagnesemia - Supplementation on board.  Low again today.  Will order mag 2g IV x 1.  Will recheck BMP and magnesium  in AM.   Goals of care - Patient is DNR.  Patient was homeless, TOC working on dispo discharge disposition.  APS involved however now stating they cannot pursue guardianship.  Per psychiatry 6/15 the patient lacks capacity for decision-making.  However, pt is now very conversive, oriented, able to make his own decisions.  Appears patient does not want to pursue ALF and stated he would walk out if placed there.  Is willing to go back to the community either aboard house or similar.  Working closely with TOC on disposition planning aligning with the patient's goals.   Subjective: Patient resting comfortably this morning.  Attempting to call local shelters in such to help with disposition planning.  Otherwise no complaints.  Denies fever, chills, shortness of breath, nausea, vomiting, abdominal pain.  Requesting eyedrops.  Physical Exam:  Vitals:   01/15/24 2134 01/16/24 0502 01/16/24 0705 01/16/24 0800  BP: 100/75 (!) 120/92  (!) 124/90  Pulse: 93 99  96  Resp:  18  17  Temp:  97.9 F (36.6 C)  97.8 F (36.6 C)  TempSrc:    Oral  SpO2:  99%  100%  Weight:   65.7 kg   Height:        GENERAL:  Alert, pleasant, no acute distress, disheveled HEENT:  EOMI CARDIOVASCULAR:  RRR, no murmurs appreciated RESPIRATORY:  Clear to auscultation, no  wheezing, rales, or rhonchi GASTROINTESTINAL:  Soft, nontender, nondistended EXTREMITIES:  No LE edema bilaterally NEURO:  No new focal deficits appreciated SKIN:  No rashes noted PSYCH:  Appropriate mood and affect    Data Reviewed:  No new imaging to review  Previous records (including but not limited to H&P,  progress notes, nursing notes, TOC management) were reviewed in assessment of this patient.  Labs: CBC: Recent Labs  Lab 01/13/24 0258 01/16/24 0614  WBC 5.1 5.8  HGB 11.0* 12.4*  HCT 34.4* 38.0*  MCV 96.1 98.2  PLT 263 308   Basic Metabolic Panel: Recent Labs  Lab 01/10/24 0430 01/11/24 0417 01/12/24 0245 01/13/24 0258 01/14/24 0313 01/15/24 0412 01/16/24 0614  NA 127*  --   --  130* 132* 136 133*  K 3.9  --   --  4.1 3.9 3.7 4.0  CL 102  --   --  99 101 104 99  CO2 22  --   --  20* 21* 23 24  GLUCOSE 79  --   --  92 97 91 89  BUN 19  --   --  29* 39* 34* 38*  CREATININE 0.53*  --   --  0.59* 0.78 0.65 0.59*  CALCIUM  8.8*  --   --  9.4 9.5 9.7 9.4  MG 1.4*   < > 1.8 1.5* 1.6* 1.5* 1.6*   < > = values in this interval not displayed.   Liver Function Tests: No results for input(s): AST, ALT, ALKPHOS, BILITOT, PROT, ALBUMIN  in the last 168 hours. CBG: Recent Labs  Lab 01/14/24 1733 01/15/24 0822 01/15/24 1158 01/15/24 1843 01/16/24 0758  GLUCAP 144* 82 108* 106* 96    Scheduled Meds:  amiodarone   200 mg Oral BID   apixaban   5 mg Oral BID   ascorbic acid   500 mg Oral Daily   feeding supplement  237 mL Oral Q24H   Gerhardt's butt cream   Topical BID   lidocaine   1 patch Transdermal Q24H   multivitamin with minerals  1 tablet Oral Daily   nutrition supplement (JUVEN)  1 packet Oral BID BM   mouth rinse  15 mL Mouth Rinse 4 times per day   pantoprazole   40 mg Oral Daily   senna-docusate  1 tablet Oral QHS   sodium chloride  flush  10-40 mL Intracatheter Q12H   sodium chloride   1 g Oral TID WC   urea   15 g Oral BID   zinc  sulfate (50mg  elemental zinc )  220 mg Oral Daily   Continuous Infusions: PRN Meds:.acetaminophen , metoprolol  tartrate, naphazoline-glycerin , OLANZapine  zydis, ondansetron  (ZOFRAN ) IV, mouth rinse  Family Communication: Family at bedside  Disposition: Status is: Inpatient Remains inpatient appropriate because: Disposition  planning     Time spent: 36 minutes  Length of inpatient stay: 39 days  Author: Carliss LELON Canales, DO 01/16/2024 10:20 AM  For on call review [email or web address].

## 2024-01-16 NOTE — Plan of Care (Signed)

## 2024-01-17 DIAGNOSIS — E162 Hypoglycemia, unspecified: Secondary | ICD-10-CM | POA: Diagnosis not present

## 2024-01-17 DIAGNOSIS — I10 Essential (primary) hypertension: Secondary | ICD-10-CM | POA: Diagnosis not present

## 2024-01-17 DIAGNOSIS — E871 Hypo-osmolality and hyponatremia: Secondary | ICD-10-CM | POA: Diagnosis not present

## 2024-01-17 LAB — BASIC METABOLIC PANEL WITH GFR
Anion gap: 9 (ref 5–15)
BUN: 48 mg/dL — ABNORMAL HIGH (ref 8–23)
CO2: 24 mmol/L (ref 22–32)
Calcium: 9.5 mg/dL (ref 8.9–10.3)
Chloride: 101 mmol/L (ref 98–111)
Creatinine, Ser: 0.63 mg/dL (ref 0.61–1.24)
GFR, Estimated: >60 mL/min (ref >60)
Glucose, Bld: 88 mg/dL (ref 70–99)
Potassium: 3.7 mmol/L (ref 3.5–5.1)
Sodium: 134 mmol/L — ABNORMAL LOW (ref 135–145)

## 2024-01-17 LAB — MAGNESIUM: Magnesium: 1.7 mg/dL (ref 1.7–2.4)

## 2024-01-17 NOTE — TOC Progression Note (Addendum)
 Transition of Care (TOC) - Progression Note    Patient Details  Name: Anthony A Saab Jr. MRN: [id number] Date of Birth: [birth year]  Transition of Care [facility]) CM/SW Contact  Anthony JONELLE Joe, RN Phone Number: 01/17/2024, 10:17 AM  Clinical Narrative:    CM met with the patient at the bedside and provided patient with three [facility] to follow up and call to see if patient can be offered a bed for shelter.  I called and spoke with Anthony [name], CM at [facility] boarding house and asked that the patient call and follow up about room availability -   [facility] - [phone] [place] - [phone] [place] - [phone]  Patient also provided number to call [employer] to make payment on his cell phone to use in the community - [phone]  I spoke with Arlon, MD and he is aware that patient is working on calling boarding houses for house availability and safe discharge plan into the community.  Patient is homeless and memory issues.  APS declined seeking guardianship for the patient.  01/17/24 1520 - CM spoke with the patient and patient states that he was asked to call back one of the [facility] tonight to discuss possible room availability.  Patient states he is anxious to discharge.   Expected Discharge Plan: Skilled Nursing Facility Barriers to Discharge: Continued Medical Work up  Expected Discharge Plan and Services In-house Referral: Clinical Social Work     Living arrangements for the past 2 months: Homeless                                       Social Determinants of Health (SDOH) Interventions SDOH Screenings   Food Insecurity: Patient Unable To Answer (12/09/2023)  Housing: Patient Unable To Answer (12/09/2023)  Transportation Needs: Patient Unable To Answer (12/09/2023)  Utilities: Patient Unable To Answer (12/09/2023)  Depression (PHQ2-9): Medium Risk (10/16/2020)  Social Connections: Unknown (12/22/2023)  Tobacco Use: High Risk (12/08/2023)     Readmission Risk Interventions    01/14/2024    9:48 AM 08/27/2023   12:21 PM 08/19/2023    9:17 AM  Readmission Risk Prevention Plan  Transportation Screening Complete Complete Complete  Medication Review Oceanographer) Complete Complete Complete  PCP or Specialist appointment within 3-5 days of discharge Complete Complete   HRI or Home Care Consult Complete Complete Complete  SW Recovery Care/Counseling Consult Complete Complete Complete  Palliative Care Screening Not Applicable Not Applicable Not Applicable  Skilled Nursing Facility Not Applicable Not Applicable Not Applicable

## 2024-01-17 NOTE — Plan of Care (Signed)
  Problem: Education: Goal: Knowledge of General Education information will improve Description: Including pain rating scale, medication(s)/side effects and non-pharmacologic comfort measures 01/17/2024 0512 by Kermit Erby NOVAK, RN Outcome: Progressing 01/17/2024 0512 by Kermit Erby NOVAK, RN Outcome: Progressing   Problem: Health Behavior/Discharge Planning: Goal: Ability to manage health-related needs will improve 01/17/2024 0512 by Kermit Erby NOVAK, RN Outcome: Progressing 01/17/2024 0512 by Kermit Erby NOVAK, RN Outcome: Progressing   Problem: Clinical Measurements: Goal: Ability to maintain clinical measurements within normal limits will improve 01/17/2024 0512 by Kermit Erby NOVAK, RN Outcome: Progressing 01/17/2024 0512 by Kermit Erby NOVAK, RN Outcome: Progressing Goal: Will remain free from infection 01/17/2024 0512 by Kermit Erby NOVAK, RN Outcome: Progressing 01/17/2024 0512 by Kermit Erby NOVAK, RN Outcome: Progressing Goal: Diagnostic test results will improve 01/17/2024 0512 by Kermit Erby NOVAK, RN Outcome: Progressing 01/17/2024 0512 by Kermit Erby NOVAK, RN Outcome: Progressing Goal: Respiratory complications will improve 01/17/2024 0512 by Kermit Erby NOVAK, RN Outcome: Progressing 01/17/2024 0512 by Kermit Erby NOVAK, RN Outcome: Progressing Goal: Cardiovascular complication will be avoided 01/17/2024 0512 by Kermit Erby NOVAK, RN Outcome: Progressing 01/17/2024 0512 by Kermit Erby NOVAK, RN Outcome: Progressing   Problem: Activity: Goal: Risk for activity intolerance will decrease 01/17/2024 0512 by Kermit Erby NOVAK, RN Outcome: Progressing 01/17/2024 0512 by Kermit Erby NOVAK, RN Outcome: Progressing   Problem: Nutrition: Goal: Adequate nutrition will be maintained 01/17/2024 0512 by Kermit Erby NOVAK, RN Outcome: Progressing 01/17/2024 0512 by Kermit Erby NOVAK, RN Outcome: Progressing   Problem: Coping: Goal: Level  of anxiety will decrease 01/17/2024 0512 by Kermit Erby NOVAK, RN Outcome: Progressing 01/17/2024 0512 by Kermit Erby NOVAK, RN Outcome: Progressing   Problem: Elimination: Goal: Will not experience complications related to bowel motility 01/17/2024 0512 by Kermit Erby NOVAK, RN Outcome: Progressing 01/17/2024 0512 by Kermit Erby NOVAK, RN Outcome: Progressing Goal: Will not experience complications related to urinary retention 01/17/2024 0512 by Kermit Erby NOVAK, RN Outcome: Progressing 01/17/2024 0512 by Kermit Erby NOVAK, RN Outcome: Progressing   Problem: Pain Managment: Goal: General experience of comfort will improve and/or be controlled 01/17/2024 0512 by Kermit Erby NOVAK, RN Outcome: Progressing 01/17/2024 0512 by Kermit Erby NOVAK, RN Outcome: Progressing   Problem: Safety: Goal: Ability to remain free from injury will improve 01/17/2024 0512 by Kermit Erby NOVAK, RN Outcome: Progressing 01/17/2024 0512 by Kermit Erby NOVAK, RN Outcome: Progressing   Problem: Skin Integrity: Goal: Risk for impaired skin integrity will decrease 01/17/2024 0512 by Kermit Erby NOVAK, RN Outcome: Progressing 01/17/2024 0512 by Kermit Erby NOVAK, RN Outcome: Progressing

## 2024-01-17 NOTE — Progress Notes (Addendum)
 Physical Therapy Treatment Patient Details Name: Anthony A Fayson Jr. MRN: [id number] DOB: 03/21/1956 Today's Date: 01/17/2024   History of Present Illness Pt is a 68 y.o. male who presented 12/06/23 with SOB. Shortly after arriving, pt became agitated and was given IV Ativan . CT head is unremarkable. Patient admitted for EtOH withdrawal, acute delirium, and hypoglycemia. Cortrak placed 5/23 now discontinued.  The day before the patient had presented to ED requesting EtOH detox; patient was deemed to be not actively in EtOH withdrawal and was discharged, however upon discharge patient threatened staff and was escorted off of the premises by [employer]. PMH includes alcohol  abuse, HTN, CVA, HFmrEF, afib, PE, HLD, anxiety, depression, chronic low back pain.    PT Comments  Patient with progression able to practice stairs again today and though fatigues quickly needing rest break able to negotiate safely with rail.  Multiple assistive devices offerred as pt fears his back and fatigue will limit his mobility and he will overdo it.  With cane states he will lose it, rollator is too big to lift onto bus when he has his backpack, power w/c evidently has some experience with trying to get this in the past and has to go to PCP though reports has to get a new PCP.  Patient able to walk limited distances without device though develops imbalance more with fatigue due to long history of back problems.  Continue to recommend RW for now at d/c.  Could benefit from power chair.  PT will follow up if not d/c (goals updated this session).    If plan is discharge home, recommend the following: Assistance with cooking/housework;Direct supervision/assist for medications management;Direct supervision/assist for financial management;Assist for transportation;Help with stairs or ramp for entrance;Supervision due to cognitive status;A [place] help with walking and/or transfers;A [place] help with bathing/dressing/bathroom   Can travel by  private vehicle     Yes  Equipment Recommendations  BSC/3in1;Rolling walker (2 wheels)    Recommendations for Other Services       Precautions / Restrictions Precautions Precautions: Fall     Mobility  Bed Mobility Overal bed mobility: Modified Independent                  Transfers Overall transfer level: Modified independent                 General transfer comment: no assist though with postural abnormalities    Ambulation/Gait Ambulation/Gait assistance: Supervision, Min assist Gait Distance (Feet): 220 Feet Assistive device: None Gait Pattern/deviations: Step-through pattern, Trunk flexed, Staggering right, Decreased stride length       General Gait Details: stable without LOB until fatigued after stair negotiation requesting to sit down; min A to recover till after seated rest   Stairs Stairs: Yes Stairs assistance: Supervision, Contact guard assist Stair Management: One rail Right, Alternating pattern, Step to pattern Number of Stairs: 10 General stair comments: assist for safety, pt safely but slowly descending after standing rest at top of steps with VSS   Wheelchair Mobility     Tilt Bed    Modified Rankin (Stroke Patients Only)       Balance Overall balance assessment: Needs assistance   Sitting balance-Leahy Scale: Good       Standing balance-Leahy Scale: Good                              Communication    Cognition Arousal: Alert Behavior During Therapy: [facility]  for tasks assessed/performed   PT - Cognitive impairments: Safety/Judgement, Awareness, Memory                       PT - Cognition Comments: hopeful for d/c soon working on boarding house though concerned about his heart says they never have addressed it and he just always does too much when he leaves; could not be redirected with education about limiting activity with gradual progression        Cueing    Exercises      General  Comments General comments (skin integrity, edema, etc.): Discussion about walker versus rollator versus power w/c.  Stated he feels he gets tired and has to sit down due to back pain and at times SOB And feels it causes him to come back to the hospital, educated needing to use RW though pt did not agree or rollator where he could sit though continued to argue he could not get it on the bus with his back pack, then encouraged to reach out to PCP about wheelchair then and he argued he needs to get new PCP, etc. so just encouraged to reach out this evening about the boarding house.      Pertinent Vitals/Pain Pain Assessment Pain Assessment: Faces Faces Pain Scale: Hurts [place] more Pain Location: back Pain Descriptors / Indicators: Aching, Sore Pain Intervention(s): Monitored during session    Home Living                          Prior Function            PT Goals (current goals can now be found in the care plan section) Acute Rehab PT Goals Patient Stated Goal: get out to community and not have to come back PT Goal Formulation: With patient Time For Goal Achievement: 01/31/24 Potential to Achieve Goals: Good Additional Goals Additional Goal #1: Patient will demonstrate DGI 19 or greater for decreased risk of falls. Progress towards PT goals: Progressing toward goals;Goals met and updated - see care plan    Frequency    Min 1X/week      PT Plan      Co-evaluation              AM-PAC PT 6 Clicks Mobility   Outcome Measure  Help needed turning from your back to your side while in a flat bed without using bedrails?: None Help needed moving from lying on your back to sitting on the side of a flat bed without using bedrails?: None Help needed moving to and from a bed to a chair (including a wheelchair)?: A Timothy Help needed standing up from a chair using your arms (e.g., wheelchair or bedside chair)?: None Help needed to walk in hospital room?: A [place] Help  needed climbing 3-5 steps with a railing? : A Pape 6 Click Score: 21    End of Session Equipment Utilized During Treatment: Gait belt Activity Tolerance: Patient tolerated treatment well Patient left: in bed;with call bell/phone within reach;with bed alarm set   PT Visit Diagnosis: Other abnormalities of gait and mobility (R26.89);Other symptoms and signs involving the nervous system (R29.898)     Time: 8492-8466 PT Time Calculation (min) (ACUTE ONLY): 26 min  Charges:    $Gait Training: 8-22 mins $Self Care/Home Management: 8-22 PT General Charges $$ ACUTE PT VISIT: 1 Visit  Micheline [name], PT Acute Rehabilitation Services Office:[phone] 01/17/2024    Anthony [name] 01/17/2024, 5:26 PM

## 2024-01-17 NOTE — Progress Notes (Signed)
 Progress Note   Patient: Anthony [name]. FMW:[id number] DOB: May 26, 1956 DOA: 12/07/2023  DOS: the patient was seen and examined on 01/17/2024   Brief hospital course:  68 y.o. male with PMH significant for homelessness, chronic alcoholism c/b withdrawals/DTs, chronic smoking, HTN, paroxysmal A-fib, h/o PE previously on Eliquis , hiatal hernia s/p Nissen fundoplication 2016, chronic low back pain, chronic anxiety, opiate withdrawal presented to the ED for confusion, low blood sugar.   During this hospitalization, he has also been seen by critical care, CHF team, GI, neurology, psychiatry and palliative care.   During this admission , had intermittent agitation- requiring Ativan  per CIWA protocol, transferred to [facility] Patient was placed on Precedex , Librium , clonazepam ... Concluded treatment Patient was seen by GI, started on stress dose steroids.  Patient was also seen by advanced heart failure team for biventricular heart failure.     He was transferred to TRH service for further management of hypoglycemia, hyponatremia.   Per psychiatry consultation, patient lacks decision-making capacity, APS involved for placement.   Subsequently stable for DC, pending placement.   Assessment and Plan:   SIRS with hypotension - Resolved.  Completed empiric 5-day course of Zosyn .   Acute metabolic encephalopathy secondary to alcohol  withdrawal - Previously in the ICU getting Precedex .  Vitamin supplementation.  Now resolved.   Hospital delirium - Likely underlying Warnicke.  Appears resolved at this time, patient back to his baseline.   Retroperitoneal hematoma/chronic iron  deficiency anemia/acute blood loss anemia - S/p 5 units total RBCs during course of hospital stay.  Hemoglobin stable.  No active bleeding.  Eliquis  resumed.   A-fib with RVR - Initially on Cardizem  drip.  Subsequently discontinued, transition to amiodarone  p.o. Eliquis .  Rate appears well-controlled at this time.   HFrEF -  EF 30-40%.  GDMT limited by hypotension.   Hyponatremia - Somewhat persistent despite sodium supplementation/IV fluids.  Responding well to urea  powder.  Serum sodium 134 this morning.  Recheck BMP in AM.   Hypokalemia/hypomagnesemia - Supplementation on board.  Low again today.  Will order mag 2g IV x 1.  Will recheck BMP and magnesium  in AM.   Goals of care - Patient is DNR.  Patient was homeless, TOC working on dispo discharge disposition.  APS involved however now stating they cannot pursue guardianship.  Per psychiatry 6/15 the patient lacks capacity for decision-making.  However, pt is now very conversive, oriented, able to make his own decisions.  Appears patient does not want to pursue ALF and stated he would walk out if placed there.  Is willing to go back to the community either aboard house or similar.  Working closely with TOC on disposition planning aligning with the patient's goals.  Appears TOC and patient have some leads on possible boardinghouse, [place] [facility] with numbers to call today.   Subjective: Patient resting comfortably this morning.  Attempting to call local shelters in such to help with disposition planning.  Otherwise no complaints.  Denies fever, chills, shortness of breath, nausea, vomiting, abdominal pain.    Physical Exam:  Vitals:   01/16/24 1946 01/17/24 0423 01/17/24 0429 01/17/24 0431  BP: 98/76  97/70 101/70  Pulse: 100  (!) 103 97  Resp: 18  17 18   Temp: (!) 100.4 F (38 C)  97.6 F (36.4 C) 97.6 F (36.4 C)  TempSrc: Oral     SpO2: 98%  98% 98%  Weight:  63.1 kg    Height:        GENERAL:  Alert,  pleasant, no acute distress, disheveled HEENT:  EOMI CARDIOVASCULAR:  RRR, no murmurs appreciated RESPIRATORY:  Clear to auscultation, no wheezing, rales, or rhonchi GASTROINTESTINAL:  Soft, nontender, nondistended EXTREMITIES:  No LE edema bilaterally NEURO:  No new focal deficits appreciated SKIN:  No rashes noted PSYCH:  Appropriate mood and  affect    Data Reviewed:  No new imaging to review  Previous records (including but not limited to H&P, progress notes, nursing notes, TOC management) were reviewed in assessment of this patient.  Labs: CBC: Recent Labs  Lab 01/13/24 0258 01/16/24 0614  WBC 5.1 5.8  HGB 11.0* 12.4*  HCT 34.4* 38.0*  MCV 96.1 98.2  PLT 263 308   Basic Metabolic Panel: Recent Labs  Lab 01/13/24 0258 01/14/24 0313 01/15/24 0412 01/16/24 0614 01/17/24 0401  NA 130* 132* 136 133* 134*  K 4.1 3.9 3.7 4.0 3.7  CL 99 101 104 99 101  CO2 20* 21* 23 24 24   GLUCOSE 92 97 91 89 88  BUN 29* 39* 34* 38* 48*  CREATININE 0.59* 0.78 0.65 0.59* 0.63  CALCIUM  9.4 9.5 9.7 9.4 9.5  MG 1.5* 1.6* 1.5* 1.6* 1.7   Liver Function Tests: No results for input(s): AST, ALT, ALKPHOS, BILITOT, PROT, ALBUMIN  in the last 168 hours. CBG: Recent Labs  Lab 01/15/24 1158 01/15/24 1843 01/16/24 0758 01/16/24 1120 01/16/24 1650  GLUCAP 108* 106* 96 102* 129*    Scheduled Meds:  amiodarone   200 mg Oral BID   apixaban   5 mg Oral BID   ascorbic acid   500 mg Oral Daily   feeding supplement  237 mL Oral Q24H   Gerhardt's butt cream   Topical BID   lidocaine   1 patch Transdermal Q24H   multivitamin with minerals  1 tablet Oral Daily   nutrition supplement (JUVEN)  1 packet Oral BID BM   mouth rinse  15 mL Mouth Rinse 4 times per day   pantoprazole   40 mg Oral Daily   senna-docusate  1 tablet Oral QHS   sodium chloride  flush  10-40 mL Intracatheter Q12H   sodium chloride   1 g Oral TID WC   urea   15 g Oral BID   zinc  sulfate (50mg  elemental zinc )  220 mg Oral Daily   Continuous Infusions: PRN Meds:.acetaminophen , metoprolol  tartrate, naphazoline-glycerin , OLANZapine  zydis, ondansetron  (ZOFRAN ) IV, mouth rinse  Family Communication: Family at bedside  Disposition: Status is: Inpatient Remains inpatient appropriate because: Disposition planning     Time spent: 30 minutes  Length of  inpatient stay: 40 days  Author: Carliss LELON Canales, DO 01/17/2024 11:33 AM  For on call review [email or web address].

## 2024-01-18 DIAGNOSIS — I480 Paroxysmal atrial fibrillation: Secondary | ICD-10-CM | POA: Diagnosis not present

## 2024-01-18 DIAGNOSIS — D539 Nutritional anemia, unspecified: Secondary | ICD-10-CM | POA: Diagnosis not present

## 2024-01-18 DIAGNOSIS — E871 Hypo-osmolality and hyponatremia: Secondary | ICD-10-CM | POA: Diagnosis not present

## 2024-01-18 LAB — BASIC METABOLIC PANEL WITH GFR
Anion gap: 8 (ref 5–15)
BUN: 49 mg/dL — ABNORMAL HIGH (ref 8–23)
CO2: 23 mmol/L (ref 22–32)
Calcium: 9.6 mg/dL (ref 8.9–10.3)
Chloride: 104 mmol/L (ref 98–111)
Creatinine, Ser: 0.61 mg/dL (ref 0.61–1.24)
GFR, Estimated: >60 mL/min (ref >60)
Glucose, Bld: 83 mg/dL (ref 70–99)
Potassium: 3.8 mmol/L (ref 3.5–5.1)
Sodium: 135 mmol/L (ref 135–145)

## 2024-01-18 LAB — CBC
HCT: 36.7 % — ABNORMAL LOW (ref 39.0–52.0)
Hemoglobin: 11.9 g/dL — ABNORMAL LOW (ref 13.0–17.0)
MCH: 31.4 pg (ref 26.0–34.0)
MCHC: 32.4 g/dL (ref 30.0–36.0)
MCV: 96.8 fL (ref 80.0–100.0)
Platelets: 302 10*3/uL (ref 150–400)
RBC: 3.79 MIL/uL — ABNORMAL LOW (ref 4.22–5.81)
RDW: 19 % — ABNORMAL HIGH (ref 11.5–15.5)
WBC: 5.1 10*3/uL (ref 4.0–10.5)
nRBC: 0 % (ref 0.0–0.2)

## 2024-01-18 LAB — MAGNESIUM: Magnesium: 1.5 mg/dL — ABNORMAL LOW (ref 1.7–2.4)

## 2024-01-18 LAB — GLUCOSE, CAPILLARY: Glucose-Capillary: 172 mg/dL — ABNORMAL HIGH (ref 70–99)

## 2024-01-18 MED ORDER — MAGNESIUM SULFATE 2 GM/50ML IV SOLN
2.0000 g | Freq: Once | INTRAVENOUS | Status: AC
  Administered 2024-01-18: 2 g via INTRAVENOUS
  Filled 2024-01-18: qty 50

## 2024-01-18 NOTE — Progress Notes (Signed)
 Mobility Specialist: Progress Note   01/18/24 1609  Mobility  Activity Ambulated with assistance in hallway  Level of Assistance Standby assist, set-up cues, supervision of patient - no hands on  Assistive Device Front wheel walker  Distance Ambulated (ft) 375 ft  Activity Response Tolerated well  Mobility Referral Yes  Mobility visit 1 Mobility  Mobility Specialist Start Time (ACUTE ONLY) 1415  Mobility Specialist Stop Time (ACUTE ONLY) 1425  Mobility Specialist Time Calculation (min) (ACUTE ONLY) 10 min    Received pt in bed having no complaints and agreeable to mobility. Pt was asymptomatic throughout ambulation and returned to room w/o fault. Left in bed w/ call bell in reach and all needs met.   Ileana Lute Mobility Specialist Please contact via SecureChat or Rehab office at [phone]

## 2024-01-18 NOTE — TOC Progression Note (Addendum)
 Transition of Care (TOC) - Progression Note    Patient Details  Name: Anthony A Hohn Jr. MRN: [id number] Date of Birth: [birth year]  Transition of Care [facility]) CM/SW Contact  Rosaline JONELLE Joe, RN Phone Number: 01/18/2024, 10:05 AM  Clinical Narrative:    CM met with the patient at the bedside and he states that he fell asleep last night and could not remember if he spoke with the boarding house last night about room availability.  CM gave the patient the list to call and follow up today.  Patient still declines ALF since he does not want to use his entire check for care at a facility.  Patient is forgetful but plans to find boarding house for placement.  1010 - I called and left a detailed voicemail with Juliene, CM with [employer] boarding homes.Adam states that he received my message and plans to follow up with the patient today/tomorrow to assist him to discuss available boarding home availability.   Expected Discharge Plan: Skilled Nursing Facility Barriers to Discharge: Continued Medical Work up  Expected Discharge Plan and Services In-house Referral: Clinical Social Work     Living arrangements for the past 2 months: Homeless                                       Social Determinants of Health (SDOH) Interventions SDOH Screenings   Food Insecurity: Patient Unable To Answer (12/09/2023)  Housing: Patient Unable To Answer (12/09/2023)  Transportation Needs: Patient Unable To Answer (12/09/2023)  Utilities: Patient Unable To Answer (12/09/2023)  Depression (PHQ2-9): Medium Risk (10/16/2020)  Social Connections: Unknown (12/22/2023)  Tobacco Use: High Risk (12/08/2023)    Readmission Risk Interventions    01/14/2024    9:48 AM 08/27/2023   12:21 PM 08/19/2023    9:17 AM  Readmission Risk Prevention Plan  Transportation Screening Complete Complete Complete  Medication Review Oceanographer) Complete Complete Complete  PCP or Specialist appointment within 3-5 days of  discharge Complete Complete   HRI or Home Care Consult Complete Complete Complete  SW Recovery Care/Counseling Consult Complete Complete Complete  Palliative Care Screening Not Applicable Not Applicable Not Applicable  Skilled Nursing Facility Not Applicable Not Applicable Not Applicable

## 2024-01-18 NOTE — Progress Notes (Signed)
 Nutrition Follow-up  DOCUMENTATION CODES:   Non-severe (moderate) malnutrition in context of chronic illness  INTERVENTION:   Continue Magic cup TID with meals, each supplement provides 290 kcal and 9 grams of protein Continue to offer Ensure Plus High Protein po TID, each supplement provides 350 kcal and 20 grams of protein Continue 1 packet Juven BID, each packet provides 95 calories, 2.5 grams of protein (collagen) to support wound healing Continue 500 mg Vitamin C  daily x 30 days and 220 mg Zinc  Sulfate daily x 15 days for wound healing Continue MVI with minerals daily  NUTRITION DIAGNOSIS:   Moderate Malnutrition related to social / environmental circumstances (ETOH use, homeless) as evidenced by moderate fat depletion, moderate muscle depletion. - Still applicable   GOAL:   Patient will meet greater than or equal to 90% of their needs - Progressing   MONITOR:   Diet advancement, Labs, Weight trends, TF tolerance  REASON FOR ASSESSMENT:   Consult Assessment of nutrition requirement/status  ASSESSMENT:   68 yo male admitted with EtOH withdrawal with DTs, lactic acidosis, concern for possible sepsis-?biliary source. PMH includes EtOH abuse with recurrent admissions for DTs, tobacco use, hiatal hernia s/p nissen (2016), HTN. Pt is homeless  TOC working with pt on placement which is pt's current barrier to discharge. Continuing to monitor electrolyte labs with repletion ordered as needed. Sodium and magnesium  levels consistently low.   Spoke with pt who was resting in bed. Pt reports no issues with n/v, but expressed concerns about bowel movements. Pt reports he feels like he has to force a bowel movement and that it is usually hard but when he takes a stool softener he ends up having bouts of diarrhea. Discussed ways to add fiber into diet to help with bowel regulation, but pt reports he does not eat many vegetables due to dentition issues. Pt's appetite has been good and he  reports eating most of dinner and lunch yesterday. He reports eating slowly over time since he tends to get full quickly. Pt reports continued intake of Juven, magic cups, and Ensure to aid in recovery and wound healing, will continue with current interventions.   Admit weight: 65 kg Current weight: 64.4 kg   Average Meal Intake: No diet summary documentation since 6/22  Nutritionally Relevant Medications:  amiodarone   200 mg Oral BID   ascorbic acid   500 mg Oral Daily   multivitamin with minerals  1 tablet Oral Daily   nutrition supplement (JUVEN)  1 packet Oral BID BM   pantoprazole   40 mg Oral Daily   senna-docusate  1 tablet Oral QHS   sodium chloride   1 g Oral TID WC   urea   15 g Oral BID   zinc  sulfate (50mg  elemental zinc )  220 mg Oral Daily   Magnesium  sulfate 220mg  IV       Labs Reviewed: Sodium 135<--134 BUN 49/ Creatinine 0.61<--0.53 Magnesium  1.5 CBG ranges from 96-129 mg/dL (3/70) [id number] 5.2  Diet Order:   Diet Order             Diet regular Room service appropriate? Yes; Fluid consistency: Thin  Diet effective now                   EDUCATION NEEDS:   No education needs have been identified at this time  Skin:  Skin Assessment: Skin Integrity Issues: Skin Integrity Issues:: Stage III Stage II: N/A Stage III: L tibial Other: IAD to rectum, full thickness lt lower leg wound  Last BM:  6/30  Height:   Ht Readings from Last 1 Encounters:  12/22/23 5' 6 (1.676 m)   Weight:   Wt Readings from Last 1 Encounters:  01/18/24 64.4 kg   Ideal Body Weight:  70 kg  BMI:  Body mass index is 22.92 kg/m.  Estimated Nutritional Needs:   Kcal:  1900-2100  Protein:  95-110g  Fluid:  >/=1.9L  Josette Glance, MS, RDN, LDN Clinical Dietitian I Please reach out via secure chat

## 2024-01-18 NOTE — Plan of Care (Signed)
   Problem: Education: Goal: Knowledge of General Education information will improve Description: Including pain rating scale, medication(s)/side effects and non-pharmacologic comfort measures Outcome: Progressing   Problem: Activity: Goal: Risk for activity intolerance will decrease Outcome: Progressing   Problem: Coping: Goal: Level of anxiety will decrease Outcome: Progressing

## 2024-01-18 NOTE — Progress Notes (Signed)
 Occupational Therapy Treatment Patient Details Name: Anthony A Donelan Jr. MRN: [id number] DOB: [birth year] Today's Date: 01/18/2024   History of present illness Pt is a 68 y.o. male who presented 12/06/23 with SOB. Shortly after arriving, pt became agitated and was given IV Ativan . CT head is unremarkable. Patient admitted for EtOH withdrawal, acute delirium, and hypoglycemia. Cortrak placed 5/23 now discontinued.  The day before the patient had presented to ED requesting EtOH detox; patient was deemed to be not actively in EtOH withdrawal and was discharged, however upon discharge patient threatened staff and was escorted off of the premises by [employer]. PMH includes alcohol  abuse, HTN, CVA, HFmrEF, afib, PE, HLD, anxiety, depression, chronic low back pain.   OT comments  Pt progressing toward established OT goals. Engaging in standing ADL at sink with supervision this session with no noted LOB; then ambulatory in hallway with RW with supervision. Pt making good progression. Will continue to follow. Goals updated       If plan is discharge home, recommend the following:  Assist for transportation;Direct supervision/assist for medications management;A [place] help with walking and/or transfers;A [place] help with bathing/dressing/bathroom;Supervision due to cognitive status   Equipment Recommendations  Other (comment) (defer next venue)    Recommendations for Other Services      Precautions / Restrictions Precautions Precautions: Fall Recall of Precautions/Restrictions: Impaired Precaution/Restrictions Comments: watch SpO2 Restrictions Weight Bearing Restrictions Per Provider Order: No       Mobility Bed Mobility Overal bed mobility: Modified Independent                  Transfers Overall transfer level: Modified independent Equipment used: None               General transfer comment: no assist though with postural abnormalities     Balance Overall balance assessment: Needs  assistance Sitting-balance support: No upper extremity supported, Feet supported Sitting balance-Leahy Scale: Good     Standing balance support: No upper extremity supported, During functional activity Standing balance-Leahy Scale: Good Standing balance comment: static balance without issues, CGA for some dynamic tasks                           ADL either performed or assessed with clinical judgement   ADL Overall ADL's : Needs assistance/impaired     Grooming: Modified independent;Standing                   Toilet Transfer: Supervision/safety;Ambulation;Rolling walker (2 wheels)                  Extremity/Trunk Assessment              Vision       Perception     Praxis     Communication Communication Communication: No apparent difficulties Factors Affecting Communication: Hearing impaired   Cognition Arousal: Alert Behavior During Therapy: WFL for tasks assessed/performed Cognition: Cognition impaired   Orientation impairments:  (A&Ox4) Awareness: Online awareness intact, Intellectual awareness intact Memory impairment (select all impairments): Working memory Attention impairment (select first level of impairment): Sustained attention (reporting h/o concussions when younger, frequent losing train of thought. During posture discussion begings to discuss wrestling in highschool; after ~3 minute story pt returns to topic of posture) Executive functioning impairment (select all impairments): Reasoning                   Following commands: Intact Following commands impaired: Follows one step commands  with increased time      Cueing   Cueing Techniques: Verbal cues  Exercises      Shoulder Instructions       General Comments      Pertinent Vitals/ Pain       Pain Assessment Pain Assessment: Faces Faces Pain Scale: Hurts a Eckley bit Pain Location: back Pain Descriptors / Indicators: Aching, Sore Pain Intervention(s):  Limited activity within patient's tolerance, Monitored during session  Home Living                                          Prior Functioning/Environment              Frequency  Min 2X/week        Progress Toward Goals  OT Goals(current goals can now be found in the care plan section)  Progress towards OT goals: Progressing toward goals  Acute Rehab OT Goals OT Goal Formulation: With patient Time For Goal Achievement: 02/01/24 Potential to Achieve Goals: Good ADL Goals Pt Will Perform Grooming: with modified independence;standing Pt Will Perform Lower Body Dressing: with modified independence;sit to/from stand Pt Will Transfer to Toilet: regular height toilet;ambulating;with modified independence  Plan      Co-evaluation                 AM-PAC OT 6 Clicks Daily Activity     Outcome Measure   Help from another person eating meals?: None Help from another person taking care of personal grooming?: None Help from another person toileting, which includes using toliet, bedpan, or urinal?: A [place] Help from another person bathing (including washing, rinsing, drying)?: A [place]ante Help from another person to put on and taking off regular upper body clothing?: A [place] Help from another person to put on and taking off regular lower body clothing?: A Nak[place] 6 Click Score: 20    End of Session Equipment Utilized During Treatment: Rolling walker (2 wheels)  OT Visit Diagnosis: Other symptoms and signs involving cognitive function;Unsteadiness on feet (R26.81);Low vision, both eyes (H54.2)   Activity Tolerance Patient tolerated treatment well   Patient Left with call bell/phone within reach;in bed;with bed alarm set   Nurse Communication Mobility status        Time: 1543-1600 OT Time Calculation (min): 17 min  Charges: OT General Charges $OT Visit: 1 Visit OT Treatments $Self Care/Home Management : 8-22 mins  Elma JONETTA Lebron FREDERICK,  OTR/L [facility] Acute Rehabilitation Office: [phone]   Elma JONETTA Lebron 01/18/2024, 4:16 PM

## 2024-01-18 NOTE — Progress Notes (Signed)
 Progress Note   Patient: Anthony [name]. FMW:[id number] DOB: 05/24/1956 DOA: 12/07/2023  DOS: the patient was seen and examined on 01/18/2024   Brief hospital course:  68 y.o. male with PMH significant for homelessness, chronic alcoholism c/b withdrawals/DTs, chronic smoking, HTN, paroxysmal A-fib, h/o PE previously on Eliquis , hiatal hernia s/p Nissen fundoplication 2016, chronic low back pain, chronic anxiety, opiate withdrawal presented to the ED for confusion, low blood sugar.   During this hospitalization, he has also been seen by critical care, CHF team, GI, neurology, psychiatry and palliative care.   During this admission , had intermittent agitation- requiring Ativan  per CIWA protocol, transferred to PCCM Patient was placed on Precedex , Librium , clonazepam ... Concluded treatment Patient was seen by GI, started on stress dose steroids.  Patient was also seen by advanced heart failure team for biventricular heart failure.     He was transferred to TRH service for further management of hypoglycemia, hyponatremia.   Per psychiatry consultation, patient lacks decision-making capacity, APS involved for placement.  However this has now resolved.  APS denied guardianship.  Pt making decisions.   Currently stable for DC, pending safe disposition.   Assessment and Plan:   SIRS with hypotension - Resolved.  Completed empiric 5-day course of Zosyn .   Acute metabolic encephalopathy secondary to alcohol  withdrawal - Previously in the ICU getting Precedex .  Vitamin supplementation.  Now resolved.   Hospital delirium - Appears resolved at this time, patient back to his baseline.   Retroperitoneal hematoma/chronic iron  deficiency anemia/acute blood loss anemia - S/p 5 units total RBCs during course of hospital stay.  Hemoglobin stable.  No active bleeding.  Eliquis  resumed.   A-fib with RVR - Initially on Cardizem  drip.  Subsequently discontinued, transition to amiodarone  p.o. Eliquis .   Rate appears well-controlled at this time.   HFrEF - EF 30-40%.  GDMT limited by hypotension.   Hyponatremia - Somewhat persistent despite sodium supplementation/IV fluids.  Responding well to urea  powder.  Serum sodium 135 this morning.  Recheck BMP in AM.   Hypokalemia/hypomagnesemia - Supplementation on board.  Low again today, 1.5.  Will order mag 2g IV x 1 again.  Will recheck BMP and magnesium  in AM.   Goals of care - Patient is DNR.  Patient was homeless, TOC working on dispo discharge disposition.  APS involved however now stating they cannot pursue guardianship.  Per psychiatry 6/15 the patient lacks capacity for decision-making.  However, pt is now very conversive, oriented, able to make his own decisions.  Appears patient does not want to pursue ALF and stated he would walk out if placed there.  Is willing to go back to the community either aboard house or similar.  Working closely with TOC on disposition planning aligning with the patient's goals.  Appears TOC and patient have some leads on possible boardinghouse, [place] [facility] with numbers to call.   Subjective: Patient resting comfortably this morning.  Attempting to call local shelters in such to help with disposition planning.  Otherwise no complaints.  Denies fever, chills, shortness of breath, nausea, vomiting, abdominal pain.    Physical Exam:  Vitals:   01/17/24 0431 01/17/24 1949 01/18/24 0415 01/18/24 0801  BP: 101/70 109/71 107/76 94/75  Pulse: 97 (!) 109 98 (!) 108  Resp: 18 20 18 18   Temp: 97.6 F (36.4 C) 98.2 F (36.8 C) (!) 97.5 F (36.4 C) 98.3 F (36.8 C)  TempSrc:   Oral   SpO2: 98% 99% 99% 96%  Weight:  64.4 kg   Height:        GENERAL:  Alert, pleasant, no acute distress, disheveled HEENT:  EOMI CARDIOVASCULAR:  RRR, no murmurs appreciated RESPIRATORY:  Clear to auscultation, no wheezing, rales, or rhonchi GASTROINTESTINAL:  Soft, nontender, nondistended EXTREMITIES:  No LE edema  bilaterally NEURO:  No new focal deficits appreciated SKIN:  No rashes noted PSYCH:  Appropriate mood and affect    Data Reviewed:  No new imaging to review  Previous records (including but not limited to H&P, progress notes, nursing notes, TOC management) were reviewed in assessment of this patient.  Labs: CBC: Recent Labs  Lab 01/13/24 0258 01/16/24 0614 01/18/24 0407  WBC 5.1 5.8 5.1  HGB 11.0* 12.4* 11.9*  HCT 34.4* 38.0* 36.7*  MCV 96.1 98.2 96.8  PLT 263 308 302   Basic Metabolic Panel: Recent Labs  Lab 01/14/24 0313 01/15/24 0412 01/16/24 0614 01/17/24 0401 01/18/24 0407  NA 132* 136 133* 134* 135  K 3.9 3.7 4.0 3.7 3.8  CL 101 104 99 101 104  CO2 21* 23 24 24 23   GLUCOSE 97 91 89 88 83  BUN 39* 34* 38* 48* 49*  CREATININE 0.78 0.65 0.59* 0.63 0.61  CALCIUM  9.5 9.7 9.4 9.5 9.6  MG 1.6* 1.5* 1.6* 1.7 1.5*   Liver Function Tests: No results for input(s): AST, ALT, ALKPHOS, BILITOT, PROT, ALBUMIN  in the last 168 hours. CBG: Recent Labs  Lab 01/15/24 1158 01/15/24 1843 01/16/24 0758 01/16/24 1120 01/16/24 1650  GLUCAP 108* 106* 96 102* 129*    Scheduled Meds:  amiodarone   200 mg Oral BID   apixaban   5 mg Oral BID   ascorbic acid   500 mg Oral Daily   feeding supplement  237 mL Oral Q24H   Gerhardt's butt cream   Topical BID   lidocaine   1 patch Transdermal Q24H   multivitamin with minerals  1 tablet Oral Daily   nutrition supplement (JUVEN)  1 packet Oral BID BM   mouth rinse  15 mL Mouth Rinse 4 times per day   pantoprazole   40 mg Oral Daily   senna-docusate  1 tablet Oral QHS   sodium chloride  flush  10-40 mL Intracatheter Q12H   sodium chloride   1 g Oral TID WC   urea   15 g Oral BID   zinc  sulfate (50mg  elemental zinc )  220 mg Oral Daily   Continuous Infusions:  magnesium  sulfate bolus IVPB     PRN Meds:.acetaminophen , metoprolol  tartrate, naphazoline-glycerin , OLANZapine  zydis, ondansetron  (ZOFRAN ) IV, mouth rinse  Family  Communication: Family at bedside  Disposition: Status is: Inpatient Remains inpatient appropriate because: Disposition planning     Time spent: 31 minutes  Length of inpatient stay: 41 days  Author: Carliss LELON Canales, DO 01/18/2024 10:25 AM  For on call review [email or web address].

## 2024-01-19 DIAGNOSIS — E162 Hypoglycemia, unspecified: Secondary | ICD-10-CM | POA: Diagnosis not present

## 2024-01-19 MED ORDER — GLYCERIN (LAXATIVE) 2 G RE SUPP
1.0000 | Freq: Once | RECTAL | Status: DC
  Filled 2024-01-19: qty 1

## 2024-01-19 NOTE — TOC Progression Note (Addendum)
 Transition of Care (TOC) - Progression Note    Patient Details  Name: Anthony A Feldmeier Jr. MRN: [id number] Date of Birth: 28-Sep-1955  Transition of Care [facility]) CM/SW Contact  Rosaline JONELLE Joe, RN Phone Number: 01/19/2024, 3:07 PM  Clinical Narrative:    CM attempted to speak to CM with [facility] home and he states that they do not have bed availability and states that patient is confused and unable to live at the home.  I spoke with the patient and he was agreeable to explore option for family care home with Delayne Roux.  I called the owner to inquire about bed availability.  Patient's FL2 was updated and emailed via secure email to Delayne Roux, owner of group home to review.   Expected Discharge Plan: Skilled Nursing Facility Barriers to Discharge: Continued Medical Work up  Expected Discharge Plan and Services In-house Referral: Clinical Social Work     Living arrangements for the past 2 months: Homeless                                       Social Determinants of Health (SDOH) Interventions SDOH Screenings   Food Insecurity: Patient Unable To Answer (12/09/2023)  Housing: Patient Unable To Answer (12/09/2023)  Transportation Needs: Patient Unable To Answer (12/09/2023)  Utilities: Patient Unable To Answer (12/09/2023)  Depression (PHQ2-9): Medium Risk (10/16/2020)  Social Connections: Unknown (12/22/2023)  Tobacco Use: High Risk (12/08/2023)    Readmission Risk Interventions    01/14/2024    9:48 AM 08/27/2023   12:21 PM 08/19/2023    9:17 AM  Readmission Risk Prevention Plan  Transportation Screening Complete Complete Complete  Medication Review Oceanographer) Complete Complete Complete  PCP or Specialist appointment within 3-5 days of discharge Complete Complete   HRI or Home Care Consult Complete Complete Complete  SW Recovery Care/Counseling Consult Complete Complete Complete  Palliative Care Screening Not Applicable Not Applicable Not  Applicable  Skilled Nursing Facility Not Applicable Not Applicable Not Applicable

## 2024-01-19 NOTE — Plan of Care (Signed)

## 2024-01-19 NOTE — Plan of Care (Signed)
   Problem: Education: Goal: Knowledge of General Education information will improve Description Including pain rating scale, medication(s)/side effects and non-pharmacologic comfort measures Outcome: Progressing

## 2024-01-19 NOTE — Progress Notes (Signed)
 PROGRESS NOTE  Anthony A Ewalt Jr. FMW:[id number] DOB: August 19, 1955 DOA: 12/07/2023 PCP: Pcp, No   LOS: 42 days   Brief Narrative / Interim history: 68 y.o. male with PMH significant for homelessness, chronic alcoholism c/b withdrawals/DTs, chronic smoking, HTN, paroxysmal A-fib, h/o PE previously on Eliquis , hiatal hernia s/p Nissen fundoplication 2016, chronic low back pain, chronic anxiety, opiate withdrawal presented to the ED for confusion, low blood sugar. During this hospitalization, he has also been seen by critical care, CHF team, GI, neurology, psychiatry and palliative care. During this admission , had intermittent agitation- requiring Ativan  per CIWA protocol, transferred to PCCM  patient was placed on Precedex , Librium , clonazepam . Concluded treatment Patient was seen by GI, started on stress dose steroids.  Patient was also seen by advanced heart failure team for biventricular heart failure.  He was transferred to TRH service for further management of hypoglycemia, hyponatremia. Per psychiatry consultation, patient lacks decision-making capacity, APS involved for placement.  However this has now resolved.  APS denied guardianship.  Pt making decisions. Currently stable for DC, pending safe disposition.  Subjective / 24h Interval events: No complaints, alert, appropriate, tells me he is working with the case manager for a safe place for him to go  Assesement and Plan: Principal Problem:   Hypoglycemia Active Problems:   Paroxysmal atrial fibrillation (HCC)   Hyponatremia   HTN (hypertension)   Macrocytic anemia   History of pulmonary embolus (PE)   Alcohol  withdrawal (HCC)   Alcohol  withdrawal delirium (HCC)   Moderate protein malnutrition (HCC)   Lactic acidosis   Acute delirium   Atrial fibrillation with RVR (HCC)   Principal problem SIRS with hypotension -  Resolved.  Completed empiric 5-day course of Zosyn .  He is afebrile now   Active problems Acute metabolic  encephalopathy secondary to alcohol  withdrawal - Previously in the ICU getting Precedex .  Vitamin supplementation.  Now resolved.   Hospital delirium -resolved, back to baseline   Retroperitoneal hematoma/chronic iron  deficiency anemia/acute blood loss anemia - S/p 5 units total RBCs during course of hospital stay.  Hemoglobin stable.  No active bleeding.  Eliquis  resumed.   A-fib with RVR -  Initially on Cardizem  drip.  Subsequently discontinued, transition to amiodarone  p.o. Eliquis .  Rate appears well-controlled at this time.   HFrEF -  EF 30-40%.  GDMT limited by hypotension.   Hyponatremia -mild, overall stable, no large shifts noted   Hypokalemia/hypomagnesemia - continue to monitor and supplement as needed.    Goals of care - Patient is DNR.  Patient was homeless, TOC working on dispo discharge disposition.  APS involved however now stating they cannot pursue guardianship.  Per psychiatry 6/15 the patient lacks capacity for decision-making.  However, pt is now very conversive, oriented, able to make his own decisions.  Appears patient does not want to pursue ALF and stated he would walk out if placed there.  Is willing to go back to the community either aboard house or similar.  Working closely with TOC on disposition planning aligning with the patient's goals.  Appears TOC and patient have some leads on possible boardinghouse, [place] [facility] with numbers to call.  Scheduled Meds:  amiodarone   200 mg Oral BID   apixaban   5 mg Oral BID   ascorbic acid   500 mg Oral Daily   feeding supplement  237 mL Oral Q24H   Gerhardt's butt cream   Topical BID   lidocaine   1 patch Transdermal Q24H   multivitamin with minerals  1  tablet Oral Daily   nutrition supplement (JUVEN)  1 packet Oral BID BM   mouth rinse  15 mL Mouth Rinse 4 times per day   pantoprazole   40 mg Oral Daily   senna-docusate  1 tablet Oral QHS   sodium chloride  flush  10-40 mL Intracatheter Q12H   sodium chloride   1 g Oral TID  WC   urea   15 g Oral BID   zinc  sulfate (50mg  elemental zinc )  220 mg Oral Daily   Continuous Infusions: PRN Meds:.acetaminophen , metoprolol  tartrate, naphazoline-glycerin , OLANZapine  zydis, ondansetron  (ZOFRAN ) IV, mouth rinse  Current Outpatient Medications  Medication Instructions   acetaminophen  (TYLENOL ) 500 mg, Oral, Every 8 hours PRN   Eliquis  5 mg, Oral, 2 times daily   FeroSul 325 mg, Oral, Daily with breakfast   folic acid  (FOLVITE ) 1 mg, Oral, Daily   magnesium  oxide (MAG-OX) 400 mg, Oral, 2 times daily   metoprolol  succinate (TOPROL -XL) 25 mg, Oral, Daily   mometasone -formoterol  (DULERA ) 200-5 MCG/ACT AERO 2 puffs, Inhalation, 2 times daily   OLANZapine  (ZYPREXA ) 2.5 mg, Oral, Daily after lunch   OLANZapine  (ZYPREXA ) 5 mg, Oral, Daily at bedtime   pantoprazole  (PROTONIX ) 40 mg, Oral, Daily   rosuvastatin  (CRESTOR ) 10 mg, Oral, Daily   thiamine  (VITAMIN B1) 100 mg, Oral, Daily    Diet Orders (From admission, onward)     Start     Ordered   01/06/24 1359  Diet regular Room service appropriate? Yes; Fluid consistency: Thin  Diet effective now       Question Answer Comment  Room service appropriate? Yes   Fluid consistency: Thin      01/06/24 1358            DVT prophylaxis:  apixaban  (ELIQUIS ) tablet 5 mg   Lab Results  Component Value Date   PLT 302 01/18/2024      Code Status: Limited: Do not attempt resuscitation (DNR) -DNR-LIMITED -Do Not Intubate/DNI   Family Communication: no family at bedside  Status is: Inpatient Remains inpatient appropriate because: severity of illness  Level of care: Med-Surg  Consultants:  none  Objective: Vitals:   01/18/24 2000 01/19/24 0418 01/19/24 0420 01/19/24 0815  BP: 104/75  96/70 110/79  Pulse: (!) 105  95 (!) 103  Resp: 18  18 19   Temp: 98.2 F (36.8 C)  97.7 F (36.5 C) 97.7 F (36.5 C)  TempSrc: Oral     SpO2: 100%  99% 98%  Weight:  64.8 kg    Height:        Intake/Output Summary (Last 24  hours) at 01/19/2024 1010 Last data filed at 01/19/2024 0500 Gross per 24 hour  Intake --  Output 600 ml  Net -600 ml   Wt Readings from Last 3 Encounters:  01/19/24 64.8 kg  07/28/23 64.7 kg  07/12/23 67.1 kg    Examination:  Constitutional: NAD Eyes: no scleral icterus ENMT: Mucous membranes are moist.  Neck: normal, supple Respiratory: clear to auscultation bilaterally, no wheezing, no crackles.  Cardiovascular: Regular rate and rhythm, no murmurs / rubs / gallops. No LE edema.  Abdomen: non distended, no tenderness. Bowel sounds positive.  Musculoskeletal: no clubbing / cyanosis.   Data Reviewed: I have independently reviewed following labs and imaging studies   CBC Recent Labs  Lab 01/13/24 0258 01/16/24 0614 01/18/24 0407  WBC 5.1 5.8 5.1  HGB 11.0* 12.4* 11.9*  HCT 34.4* 38.0* 36.7*  PLT 263 308 302  MCV 96.1 98.2 96.8  MCH  30.7 32.0 31.4  MCHC 32.0 32.6 32.4  RDW 19.3* 19.2* 19.0*    Recent Labs  Lab 01/14/24 0313 01/15/24 0412 01/16/24 0614 01/17/24 0401 01/18/24 0407  NA 132* 136 133* 134* 135  K 3.9 3.7 4.0 3.7 3.8  CL 101 104 99 101 104  CO2 21* 23 24 24 23   GLUCOSE 97 91 89 88 83  BUN 39* 34* 38* 48* 49*  CREATININE 0.78 0.65 0.59* 0.63 0.61  CALCIUM  9.5 9.7 9.4 9.5 9.6  MG 1.6* 1.5* 1.6* 1.7 1.5*    ------------------------------------------------------------------------------------------------------------------ No results for input(s): CHOL, HDL, LDLCALC, TRIG, CHOLHDL, LDLDIRECT in the last 72 hours.  Lab Results  Component Value Date   HGBA1C 5.2 02/06/2023   ------------------------------------------------------------------------------------------------------------------ No results for input(s): TSH, T4TOTAL, T3FREE, THYROIDAB in the last 72 hours.  Invalid input(s): FREET3  Cardiac Enzymes No results for input(s): CKMB, TROPONINI, MYOGLOBIN in the last 168 hours.  Invalid input(s):  CK ------------------------------------------------------------------------------------------------------------------    Component Value Date/Time   BNP 265.6 (H) 01/02/2024 0008    CBG: Recent Labs  Lab 01/15/24 1843 01/16/24 0758 01/16/24 1120 01/16/24 1650 01/18/24 2056  GLUCAP 106* 96 102* 129* 172*    No results found for this or any previous visit (from the past 240 hours).   Radiology Studies: No results found.   Nilda Fendt, MD, PhD [facility]  Between 7 am - 7 pm I am available, please contact me via Amion (for emergencies) or Securechat (non urgent messages)  Between 7 pm - 7 am I am not available, please contact night coverage MD/APP via Amion

## 2024-01-19 NOTE — [place] FL2 (Addendum)
 [place]  MEDICAID [id number] LEVEL OF CARE FORM     IDENTIFICATION  Patient Name: Anthony A Sano Jr. Birthdate: 08/14/1955 Sex: male Admission Date (Current Location): 12/07/2023  [facility] and [place] Number:  Producer, television/film/video and Address:  [facility] [place], [address], [place], [place] [address]      Provider Number: [id number]  Attending Physician Name and Address:  Trixie Nilda HERO, MD  Relative Name and Phone Number:       Current Level of Care: Hospital Recommended Level of Care: [facility] Prior Approval Number:    Date Approved/Denied:  PASRR Number:  Discharge Plan: Other (Comment) [facility] / ALF)    Current Diagnoses: Patient Active Problem List   Diagnosis Date Noted   Atrial fibrillation with RVR (HCC) 01/02/2024   Acute delirium 12/08/2023   Agitation 07/29/2023   History of CVA (cerebrovascular accident) 07/11/2023   Acute pulmonary embolism (HCC) 07/08/2023   Lactic acidosis 07/08/2023   Rib fractures 02/04/2023   Hypoglycemia 02/04/2023   Pulmonary embolus (HCC) 02/04/2023   Pulmonary embolism (HCC) 02/03/2023   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 difficile colitis 09/11/2022   Aspiration pneumonia (HCC) 08/27/2022   Aortic atherosclerosis (HCC) 08/27/2022   Depression, unspecified 08/09/2022   Hyponatremia 06/01/2022   Non compliance w medication regimen 05/31/2022   Delirium 05/25/2022   Stricture and stenosis of esophagus 05/21/2022   Gastritis and gastroduodenitis 05/21/2022   Loose stools 05/20/2022   Malnutrition of moderate degree 05/20/2022   Acute on chronic heart failure (HCC)    Leukocytosis 05/19/2022    Sepsis (HCC) 05/19/2022   Cellulitis of left leg, possible 05/19/2022   Moniliasis, interdigital 05/19/2022   Open toe wound, right foot, medial fourth digit 05/19/2022   Malnutrition (HCC) 05/19/2022   Alcohol  abuse 03/24/2022   Dyslipidemia 03/24/2022   Acute on chronic diastolic CHF (congestive heart failure) (HCC) 03/24/2022   Acute respiratory failure with hypoxia (HCC) 03/23/2022   Generalized weakness    Atrial fibrillation with rapid ventricular response (HCC) 03/15/2022   Hypokalemia 03/15/2022   Decreased functional mobility and endurance 06/22/2021   Iron  deficiency anemia 10/16/2020   Cough    Hypomagnesemia    Protein-calorie malnutrition, severe 08/22/2020   Alcohol  use disorder, severe, in controlled environment (HCC)    Pressure injury of skin 08/18/2020   Urinary tract infection without hematuria    Severe sepsis (HCC) 08/14/2020   Paroxysmal atrial fibrillation (HCC) 08/13/2020   Rhabdomyolysis 07/22/2020   Cerebral ventriculomegaly 07/22/2020   Hemoglobin decreased 07/22/2020   History of delirium tremons 07/22/2020   Hypoalbuminemia due to protein-calorie malnutrition (HCC) 07/22/2020   Tobacco use disorder 07/22/2020   Alcohol  withdrawal delirium (HCC)    Dehydration    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) 07/18/2018   Depression 05/26/2018   Anxiety state 04/25/2018   Need for immunization against influenza 04/25/2018   Alcohol  withdrawal (HCC) 09/01/2017   DOE (dyspnea on exertion) 11/27/2016   Actinic keratosis 11/27/2016   Macrocytic anemia 11/23/2016   History of pulmonary embolus (PE) 11/02/2016   Hiatal hernia 09/14/2016   Skin cancer 08/13/2016   Poor social situation 06/09/2013   Tinea versicolor 06/08/2013   Back pain 05/07/2013  Seborrheic dermatitis of scalp 11/08/2012   HTN (hypertension) 04/11/2012   Alcohol  use disorder, severe, dependence (HCC) 09/16/2006    Orientation RESPIRATION BLADDER Height & Weight      Self, Time, Situation, Place  Normal Continent Weight: 64.8 kg Height:  5' 6 (167.6 cm)  BEHAVIORAL SYMPTOMS/MOOD NEUROLOGICAL BOWEL NUTRITION STATUS      Continent Diet Regular; thin  AMBULATORY STATUS COMMUNICATION OF NEEDS Skin   Supervision Verbally                        Personal Care Assistance Level of Assistance  Bathing, Feeding, Dressing Bathing Assistance: Independent Feeding assistance: Independent Dressing Assistance: Independent     Functional Limitations Info  Hearing, Speech Sight Info: Impaired Hearing Info: Adequate Speech Info: Adequate    SPECIAL CARE FACTORS FREQUENCY                   Contractures Contractures Info: Not present    Additional Factors Info  Code Status, Allergies Code Status Info: DNR Allergies Info: NKDA Psychotropic Info: Zyprexa          Current Medications (01/19/2024):  This is the current hospital active medication list Current Facility-Administered Medications  Medication Dose Route Frequency Provider Last Rate Last Admin   acetaminophen  (TYLENOL ) tablet 650 mg  650 mg Oral Q6H PRN Kandis Devaughn Sayres, MD   650 mg at 01/19/24 9060   amiodarone  (PACERONE ) tablet 200 mg  200 mg Oral BID Kandis Devaughn Sayres, MD   200 mg at 01/19/24 9065   apixaban  (ELIQUIS ) tablet 5 mg  5 mg Oral BID Sreeram, Narendranath, MD   5 mg at 01/19/24 0933   ascorbic acid  (VITAMIN C ) tablet 500 mg  500 mg Oral Daily Shahmehdi, Seyed A, MD   500 mg at 01/19/24 0933   feeding supplement (ENSURE PLUS HIGH PROTEIN) liquid 237 mL  237 mL Oral Q24H Shahmehdi, Seyed A, MD   237 mL at 01/19/24 9060   Gerhardt's butt cream   Topical BID Claudene Toribio BROCKS, MD   Given at 01/19/24 9062   lidocaine  (LIDODERM ) 5 % 1 patch  1 patch Transdermal Q24H Claudene Toribio BROCKS, MD   1 patch at 01/19/24 0937   metoprolol  tartrate (LOPRESSOR ) injection 5 mg  5 mg Intravenous Q4H PRN Howerter, Justin B, DO   5 mg at 01/02/24 0229   multivitamin with minerals tablet 1 tablet   1 tablet Oral Daily Dahal, Chapman, MD   1 tablet at 01/19/24 9065   naphazoline-glycerin  (CLEAR EYES REDNESS) ophth solution 1-2 drop  1-2 drop Both Eyes QID PRN Dahal, Binaya, MD   1 drop at 01/16/24 1206   nutrition supplement (JUVEN) (JUVEN) powder packet 1 packet  1 packet Oral BID BM Shahmehdi, Seyed A, MD   1 packet at 01/19/24 1459   OLANZapine  zydis (ZYPREXA ) disintegrating tablet 5 mg  5 mg Oral BID PRN Akintayo, Musa A, MD   5 mg at 01/07/24 1343   ondansetron  (ZOFRAN ) injection 4 mg  4 mg Intravenous Q6H PRN Howerter, Justin B, DO   4 mg at 01/02/24 1057   Oral care mouth rinse  15 mL Mouth Rinse 4 times per day Claudene Toribio BROCKS, MD   15 mL at 01/19/24 1500   Oral care mouth rinse  15 mL Mouth Rinse PRN Shahmehdi, Seyed A, MD       pantoprazole  (PROTONIX ) EC tablet 40 mg  40 mg Oral Daily Cyndy Ozell DASEN, [facility]  40 mg at 01/19/24 0933   senna-docusate (Senokot-S) tablet 1 tablet  1 tablet Oral QHS Dahal, Chapman, MD   1 tablet at 01/18/24 2117   sodium chloride  flush (NS) 0.9 % injection 10-40 mL  10-40 mL Intracatheter Q12H Maree Harder, MD   10 mL at 01/19/24 9062   sodium chloride  tablet 1 g  1 g Oral TID WC Sreeram, Concepcion, MD   1 g at 01/19/24 1459   urea  (URE-NA) oral packet 15 g  15 g Oral BID Arlon Carliss ORN, DO   15 g at 01/19/24 9064   zinc  sulfate (50mg  elemental zinc ) capsule 220 mg  220 mg Oral Daily Shahmehdi, Seyed A, MD   220 mg at 01/19/24 9066     Discharge Medications: Please see discharge summary for a list of discharge medications.  Relevant Imaging Results:  Relevant Lab Results:   Additional Information [id number]  Rosaline JONELLE Joe, RN

## 2024-01-19 NOTE — Progress Notes (Signed)
 Occupational Therapy Treatment Patient Details Name: Anthony [name]. MRN: [id number] DOB: [birth year] Today's Date: 01/19/2024   History of present illness Pt is a 68 y.o. male who presented 12/06/23 with SOB. Shortly after arriving, pt became agitated and was given IV Ativan . CT head is unremarkable. Patient admitted for EtOH withdrawal, acute delirium, and hypoglycemia. Cortrak placed 5/23 now discontinued.  The day before the patient had presented to ED requesting EtOH detox; patient was deemed to be not actively in EtOH withdrawal and was discharged, however upon discharge patient threatened staff and was escorted off of the premises by [employer]. PMH includes alcohol  abuse, HTN, CVA, HFmrEF, afib, PE, HLD, anxiety, depression, chronic low back pain.   OT comments  Patient received in supine and able to get to EOB without assistance. Patient donned gown for back and socks seated on EOB. Patient declined going to sink for bathing and grooming tasks and states he just wants to walk in hallway and come back to bed. Patient was supervision for mobility in hallway with RW and returned to supine without assistance. Patient will benefit from continued inpatient follow up therapy, <3 hours/day. Acute OT to continue to follow to address established goals to facilitate DC to next venue of care.        If plan is discharge home, recommend the following:  Assist for transportation;Direct supervision/assist for medications management;A [place] help with walking and/or transfers;A [place] help with bathing/dressing/bathroom;Supervision due to cognitive status   Equipment Recommendations  Other (comment) (defer to next venue of care)    Recommendations for Other Services      Precautions / Restrictions Precautions Precautions: Fall Recall of Precautions/Restrictions: Impaired Precaution/Restrictions Comments: watch SpO2 Restrictions Weight Bearing Restrictions Per Provider Order: No       Mobility Bed  Mobility Overal bed mobility: Modified Independent             General bed mobility comments: Patient able to get to EOB and back to supine without assistance    Transfers Overall transfer level: Modified independent Equipment used: Rolling walker (2 wheels)               General transfer comment: performed transfers and mobility with RW, patient declined performing with RW     Balance Overall balance assessment: Needs assistance Sitting-balance support: No upper extremity supported, Feet supported Sitting balance-Leahy Scale: Good Sitting balance - Comments: EOB   Standing balance support: No upper extremity supported, Bilateral upper extremity supported, During functional activity Standing balance-Leahy Scale: Good Standing balance comment: Patient asking to use RW on this date                           ADL either performed or assessed with clinical judgement   ADL Overall ADL's : Needs assistance/impaired                 Upper Body Dressing : Set up;Sitting Upper Body Dressing Details (indicate cue type and reason): gown for back Lower Body Dressing: Supervision/safety;Sitting/lateral leans Lower Body Dressing Details (indicate cue type and reason): able to donn/doff socks seated on EOB               General ADL Comments: declined performing bathing or grooming tasks at sink    Extremity/Trunk Assessment              Vision       Perception     Praxis  Communication Communication Communication: No apparent difficulties Factors Affecting Communication: Hearing impaired   Cognition Arousal: Alert Behavior During Therapy: WFL for tasks assessed/performed Cognition: Cognition impaired     Awareness: Online awareness intact, Intellectual awareness intact Memory impairment (select all impairments): Working memory Attention impairment (select first level of impairment): Sustained attention Executive functioning impairment  (select all impairments): Reasoning                   Following commands: Intact Following commands impaired: Follows one step commands with increased time      Cueing   Cueing Techniques: Verbal cues  Exercises      Shoulder Instructions       General Comments      Pertinent Vitals/ Pain       Pain Assessment Pain Assessment: 0-10 Faces Pain Scale: Hurts even more Pain Location: back Pain Descriptors / Indicators: Aching, Sore Pain Intervention(s): Limited activity within patient's tolerance, Monitored during session, Repositioned  Home Living                                          Prior Functioning/Environment              Frequency  Min 2X/week        Progress Toward Goals  OT Goals(current goals can now be found in the care plan section)  Progress towards OT goals: Progressing toward goals  Acute Rehab OT Goals Patient Stated Goal: none stated OT Goal Formulation: With patient Time For Goal Achievement: 02/01/24 Potential to Achieve Goals: Good ADL Goals Pt Will Perform Grooming: with modified independence;standing Pt Will Perform Lower Body Dressing: with modified independence;sit to/from stand Pt Will Transfer to Toilet: regular height toilet;ambulating;with modified independence Additional ADL Goal #1: pt will perform pillbox assessment mod I for increased time.  Plan      Co-evaluation                 AM-PAC OT 6 Clicks Daily Activity     Outcome Measure   Help from another person eating meals?: None Help from another person taking care of personal grooming?: None Help from another person toileting, which includes using toliet, bedpan, or urinal?: A [place] Help from another person bathing (including washing, rinsing, drying)?: A [place] Help from another person to put on and taking off regular upper body clothing?: A [place] Help from another person to put on and taking off regular lower body clothing?: A  [place]pp 6 Click Score: 20    End of Session Equipment Utilized During Treatment: Rolling walker (2 wheels)  OT Visit Diagnosis: Other symptoms and signs involving cognitive function;Unsteadiness on feet (R26.81);Low vision, both eyes (H54.2)   Activity Tolerance Patient tolerated treatment well   Patient Left with call bell/phone within reach;in bed;with bed alarm set   Nurse Communication Mobility status        Time: 8995-8980 OT Time Calculation (min): 15 min  Charges: OT General Charges $OT Visit: 1 Visit OT Treatments $Self Care/Home Management : 8-22 mins  Dick Laine, OTA Acute Rehabilitation Services  Office [phone]   Jeb LITTIE Laine 01/19/2024, 12:02 PM

## 2024-01-20 DIAGNOSIS — E162 Hypoglycemia, unspecified: Secondary | ICD-10-CM | POA: Diagnosis not present

## 2024-01-20 LAB — CBC
HCT: 35.1 % — ABNORMAL LOW (ref 39.0–52.0)
Hemoglobin: 11.5 g/dL — ABNORMAL LOW (ref 13.0–17.0)
MCH: 32 pg (ref 26.0–34.0)
MCHC: 32.8 g/dL (ref 30.0–36.0)
MCV: 97.8 fL (ref 80.0–100.0)
Platelets: 298 10*3/uL (ref 150–400)
RBC: 3.59 MIL/uL — ABNORMAL LOW (ref 4.22–5.81)
RDW: 18.7 % — ABNORMAL HIGH (ref 11.5–15.5)
WBC: 4.9 10*3/uL (ref 4.0–10.5)
nRBC: 0 % (ref 0.0–0.2)

## 2024-01-20 LAB — COMPREHENSIVE METABOLIC PANEL WITH GFR
ALT: 32 U/L (ref 0–44)
AST: 36 U/L (ref 15–41)
Albumin: 3 g/dL — ABNORMAL LOW (ref 3.5–5.0)
Alkaline Phosphatase: 91 U/L (ref 38–126)
Anion gap: 9 (ref 5–15)
BUN: 36 mg/dL — ABNORMAL HIGH (ref 8–23)
CO2: 21 mmol/L — ABNORMAL LOW (ref 22–32)
Calcium: 9.5 mg/dL (ref 8.9–10.3)
Chloride: 105 mmol/L (ref 98–111)
Creatinine, Ser: 0.57 mg/dL — ABNORMAL LOW (ref 0.61–1.24)
GFR, Estimated: >60 mL/min (ref >60)
Glucose, Bld: 79 mg/dL (ref 70–99)
Potassium: 3.6 mmol/L (ref 3.5–5.1)
Sodium: 135 mmol/L (ref 135–145)
Total Bilirubin: 0.4 mg/dL (ref 0.0–1.2)
Total Protein: 6.9 g/dL (ref 6.5–8.1)

## 2024-01-20 LAB — GLUCOSE, CAPILLARY
Glucose-Capillary: 110 mg/dL — ABNORMAL HIGH (ref 70–99)
Glucose-Capillary: 104 mg/dL — ABNORMAL HIGH (ref 70–99)
Glucose-Capillary: 123 mg/dL — ABNORMAL HIGH (ref 70–99)
Glucose-Capillary: 102 mg/dL — ABNORMAL HIGH (ref 70–99)

## 2024-01-20 LAB — MAGNESIUM: Magnesium: 1.4 mg/dL — ABNORMAL LOW (ref 1.7–2.4)

## 2024-01-20 MED ORDER — POTASSIUM CHLORIDE CRYS ER 20 MEQ PO TBCR
40.0000 meq | EXTENDED_RELEASE_TABLET | Freq: Once | ORAL | Status: AC
  Administered 2024-01-20: 40 meq via ORAL
  Filled 2024-01-20: qty 2

## 2024-01-20 MED ORDER — MAGNESIUM SULFATE 2 GM/50ML IV SOLN
2.0000 g | Freq: Once | INTRAVENOUS | Status: AC
  Administered 2024-01-20: 2 g via INTRAVENOUS
  Filled 2024-01-20: qty 50

## 2024-01-20 NOTE — Progress Notes (Signed)
 PROGRESS NOTE  Anthony A Foulks Jr. FMW:[id number] DOB: 1956/06/10 DOA: 12/07/2023 PCP: Pcp, No   LOS: 43 days   Brief Narrative / Interim history: 68 y.o. male with PMH significant for homelessness, chronic alcoholism c/b withdrawals/DTs, chronic smoking, HTN, paroxysmal A-fib, h/o PE previously on Eliquis , hiatal hernia s/p Nissen fundoplication 2016, chronic low back pain, chronic anxiety, opiate withdrawal presented to the ED for confusion, low blood sugar. During this hospitalization, he has also been seen by critical care, CHF team, GI, neurology, psychiatry and palliative care. During this admission , had intermittent agitation- requiring Ativan  per CIWA protocol, transferred to PCCM  patient was placed on Precedex , Librium , clonazepam . Concluded treatment Patient was seen by GI, started on stress dose steroids.  Patient was also seen by advanced heart failure team for biventricular heart failure.  He was transferred to TRH service for further management of hypoglycemia, hyponatremia. Per psychiatry consultation, patient lacks decision-making capacity, APS involved for placement.  However this has now resolved.  APS denied guardianship.  Pt making decisions. Currently stable for DC, pending safe disposition.  Subjective / 24h Interval events: Complains of shortness of breath if he is up and about, the more he walks the more short of breath he gets  Assesement and Plan: Principal Problem:   Hypoglycemia Active Problems:   Paroxysmal atrial fibrillation (HCC)   Hyponatremia   HTN (hypertension)   Macrocytic anemia   History of pulmonary embolus (PE)   Alcohol  withdrawal (HCC)   Alcohol  withdrawal delirium (HCC)   Moderate protein malnutrition (HCC)   Lactic acidosis   Acute delirium   Atrial fibrillation with RVR (HCC)   Principal problem SIRS with hypotension -  Resolved.  Completed empiric 5-day course of Zosyn .  He is afebrile now   Active problems Acute metabolic encephalopathy  secondary to alcohol  withdrawal - Previously in the ICU getting Precedex .  Vitamin supplementation.  Resolved, alert, appropriate   Hospital delirium -resolved, back to baseline   Retroperitoneal hematoma/chronic iron  deficiency anemia/acute blood loss anemia - S/p 5 units total RBCs during course of hospital stay.  Hemoglobin stable.  No active bleeding.  Continue Eliquis    A-fib with RVR -  Initially on Cardizem  drip.  Subsequently discontinued, transition to amiodarone  p.o. Eliquis .  Rate with decent control, low 100s   HFrEF -  EF 30-40%.  GDMT limited by hypotension.   Hyponatremia -mild, overall stable, sodium normalized today   Hypokalemia/hypomagnesemia -replace magnesium  again today.  Potassium stable   Goals of care - Patient is DNR.  Patient was homeless, TOC working on dispo discharge disposition.  APS involved however now stating they cannot pursue guardianship.  Per psychiatry 6/15 the patient lacks capacity for decision-making.  However, pt is now very conversive, oriented, able to make his own decisions.  Appears patient does not want to pursue ALF and stated he would walk out if placed there.  Is willing to go back to the community either aboard house or similar.  Working closely with TOC on disposition planning aligning with the patient's goals.  Appears TOC and patient have some leads on possible boardinghouse, [place] [facility] with numbers to call.  Scheduled Meds:  amiodarone   200 mg Oral BID   apixaban   5 mg Oral BID   ascorbic acid   500 mg Oral Daily   feeding supplement  237 mL Oral Q24H   Gerhardt's butt cream   Topical BID   Glycerin  (Adult)  1 suppository Rectal Once   lidocaine   1 patch Transdermal  Q24H   multivitamin with minerals  1 tablet Oral Daily   nutrition supplement (JUVEN)  1 packet Oral BID BM   mouth rinse  15 mL Mouth Rinse 4 times per day   pantoprazole   40 mg Oral Daily   senna-docusate  1 tablet Oral QHS   sodium chloride  flush  10-40 mL  Intracatheter Q12H   sodium chloride   1 g Oral TID WC   urea   15 g Oral BID   zinc  sulfate (50mg  elemental zinc )  220 mg Oral Daily   Continuous Infusions: PRN Meds:.acetaminophen , metoprolol  tartrate, naphazoline-glycerin , OLANZapine  zydis, ondansetron  (ZOFRAN ) IV, mouth rinse  Current Outpatient Medications  Medication Instructions   acetaminophen  (TYLENOL ) 500 mg, Oral, Every 8 hours PRN   Eliquis  5 mg, Oral, 2 times daily   FeroSul 325 mg, Oral, Daily with breakfast   folic acid  (FOLVITE ) 1 mg, Oral, Daily   magnesium  oxide (MAG-OX) 400 mg, Oral, 2 times daily   metoprolol  succinate (TOPROL -XL) 25 mg, Oral, Daily   mometasone -formoterol  (DULERA ) 200-5 MCG/ACT AERO 2 puffs, Inhalation, 2 times daily   OLANZapine  (ZYPREXA ) 2.5 mg, Oral, Daily after lunch   OLANZapine  (ZYPREXA ) 5 mg, Oral, Daily at bedtime   pantoprazole  (PROTONIX ) 40 mg, Oral, Daily   rosuvastatin  (CRESTOR ) 10 mg, Oral, Daily   thiamine  (VITAMIN B1) 100 mg, Oral, Daily    Diet Orders (From admission, onward)     Start     Ordered   01/06/24 1359  Diet regular Room service appropriate? Yes; Fluid consistency: Thin  Diet effective now       Question Answer Comment  Room service appropriate? Yes   Fluid consistency: Thin      01/06/24 1358            DVT prophylaxis:  apixaban  (ELIQUIS ) tablet 5 mg   Lab Results  Component Value Date   PLT 298 01/20/2024      Code Status: Limited: Do not attempt resuscitation (DNR) -DNR-LIMITED -Do Not Intubate/DNI   Family Communication: no family at bedside  Status is: Inpatient Remains inpatient appropriate because: severity of illness  Level of care: Med-Surg  Consultants:  none  Objective: Vitals:   01/19/24 2028 01/20/24 0300 01/20/24 0417 01/20/24 0823  BP: 110/86  92/70 95/67  Pulse: 91  99 (!) 104  Resp: 18  18 18   Temp: 97.8 F (36.6 C)  97.7 F (36.5 C) 97.8 F (36.6 C)  TempSrc:   Oral Oral  SpO2: 97%  98% 100%  Weight:  64.9 kg     Height:        Intake/Output Summary (Last 24 hours) at 01/20/2024 1041 Last data filed at 01/20/2024 0415 Gross per 24 hour  Intake --  Output 1400 ml  Net -1400 ml   Wt Readings from Last 3 Encounters:  01/20/24 64.9 kg  07/28/23 64.7 kg  07/12/23 67.1 kg    Examination:  Constitutional: NAD Eyes: lids and conjunctivae normal, no scleral icterus ENMT: mmm Neck: normal, supple Respiratory: clear to auscultation bilaterally, no wheezing, no crackles. Normal respiratory effort.  Cardiovascular: Regular rate and rhythm, no murmurs / rubs / gallops. No LE edema. Abdomen: soft, no distention, no tenderness. Bowel sounds positive.   Data Reviewed: I have independently reviewed following labs and imaging studies   CBC Recent Labs  Lab 01/16/24 0614 01/18/24 0407 01/20/24 0258  WBC 5.8 5.1 4.9  HGB 12.4* 11.9* 11.5*  HCT 38.0* 36.7* 35.1*  PLT 308 302 298  MCV 98.2  96.8 97.8  MCH 32.0 31.4 32.0  MCHC 32.6 32.4 32.8  RDW 19.2* 19.0* 18.7*    Recent Labs  Lab 01/15/24 0412 01/16/24 0614 01/17/24 0401 01/18/24 0407 01/20/24 0258  NA 136 133* 134* 135 135  K 3.7 4.0 3.7 3.8 3.6  CL 104 99 101 104 105  CO2 23 24 24 23  21*  GLUCOSE 91 89 88 83 79  BUN 34* 38* 48* 49* 36*  CREATININE 0.65 0.59* 0.63 0.61 0.57*  CALCIUM  9.7 9.4 9.5 9.6 9.5  AST  --   --   --   --  36  ALT  --   --   --   --  32  ALKPHOS  --   --   --   --  91  BILITOT  --   --   --   --  0.4  ALBUMIN   --   --   --   --  3.0*  MG 1.5* 1.6* 1.7 1.5* 1.4*    ------------------------------------------------------------------------------------------------------------------ No results for input(s): CHOL, HDL, LDLCALC, TRIG, CHOLHDL, LDLDIRECT in the last 72 hours.  Lab Results  Component Value Date   HGBA1C 5.2 02/06/2023   ------------------------------------------------------------------------------------------------------------------ No results for input(s): TSH, T4TOTAL,  T3FREE, THYROIDAB in the last 72 hours.  Invalid input(s): FREET3  Cardiac Enzymes No results for input(s): CKMB, TROPONINI, MYOGLOBIN in the last 168 hours.  Invalid input(s): CK ------------------------------------------------------------------------------------------------------------------    Component Value Date/Time   BNP 265.6 (H) 01/02/2024 0008    CBG: Recent Labs  Lab 01/16/24 0758 01/16/24 1120 01/16/24 1650 01/18/24 2056 01/20/24 0924  GLUCAP 96 102* 129* 172* 110*    No results found for this or any previous visit (from the past 240 hours).   Radiology Studies: No results found.   Nilda Fendt, MD, PhD [facility]  Between 7 am - 7 pm I am available, please contact me via Amion (for emergencies) or Securechat (non urgent messages)  Between 7 pm - 7 am I am not available, please contact night coverage MD/APP via Amion

## 2024-01-20 NOTE — Plan of Care (Signed)

## 2024-01-20 NOTE — Progress Notes (Signed)
 Physical Therapy Treatment Patient Details Name: Anthony A Wilton Jr. MRN: [id number] DOB: 04-09-1956 Today's Date: 01/20/2024   History of Present Illness Pt is a 68 y.o. male who presented 12/06/23 with SOB. Shortly after arriving, pt became agitated and was given IV Ativan . CT head is unremarkable. Patient admitted for EtOH withdrawal, acute delirium, and hypoglycemia. Cortrak placed 5/23 now discontinued.  The day before the patient had presented to ED requesting EtOH detox; patient was deemed to be not actively in EtOH withdrawal and was discharged, however upon discharge patient threatened staff and was escorted off of the premises by [employer]. PMH includes alcohol  abuse, HTN, CVA, HFmrEF, afib, PE, HLD, anxiety, depression, chronic low back pain.    PT Comments  The pt tends to ambulate with an inferior gaze and flexed posture. Pt needed repeated cues to extend his cervical and thoracic spine and adduct his scapulas to momentarily correct his posture when ambulating, sustaining it longer when cued to scan his environment for objects of various colors. Directed pt through exercises to improve his upper back and cervical extension strength to correct his posture and to improve his lower extremity strength. Discussed different options for carrying his belongings in the community with pt stating a basket on the front of his RW would not fit on the bus and that he already carries a backpack full of his belongings with [place] room to add more. He demonstrates continued deficits in strength, endurance, and memory. Will continue to follow acutely.    If plan is discharge home, recommend the following: Assistance with cooking/housework;Direct supervision/assist for medications management;Direct supervision/assist for financial management;Assist for transportation;Help with stairs or ramp for entrance;Supervision due to cognitive status;A Eckstrom help with walking and/or transfers;A Guillot help with  bathing/dressing/bathroom   Can travel by private vehicle     Yes  Equipment Recommendations  BSC/3in1;Rolling walker (2 wheels)    Recommendations for Other Services       Precautions / Restrictions Precautions Precautions: Fall Recall of Precautions/Restrictions: Impaired Precaution/Restrictions Comments: watch SpO2 Restrictions Weight Bearing Restrictions Per Provider Order: No     Mobility  Bed Mobility Overal bed mobility: Modified Independent Bed Mobility: Supine to Sit, Sit to Supine     Supine to sit: Modified independent (Device/Increase time), HOB elevated, Used rails Sit to supine: Modified independent (Device/Increase time), Used rails, HOB elevated   General bed mobility comments: Mod I using bed rails with HOB elevated to transition supine <> sit L EOB    Transfers Overall transfer level: Needs assistance Equipment used: Rolling walker (2 wheels), None Transfers: Sit to/from Stand Sit to Stand: Supervision           General transfer comment: Pt able to come to stand from EOB to RW 1x with supervision for safety, x10 reps to no AD with 8/10 with hands across his chest, supervision for safety    Ambulation/Gait Ambulation/Gait assistance: Supervision Gait Distance (Feet): 270 Feet Assistive device: Rolling walker (2 wheels) Gait Pattern/deviations: Step-through pattern, Trunk flexed, Decreased stride length Gait velocity: reduced Gait velocity interpretation: <1.8 ft/sec, indicate of risk for recurrent falls   General Gait Details: Pt ambulates with a flexed posture and inferior gaze, needing repeated verbal and tactile cues to look up and adduct his scapulas to improve his posture. Momentary success noted. Needs intermittent cues to keep RW proximal. Had pt look for objects of various colors in hall to facilitate upward gaze, cervical extension, and improved posture. Supervision for safety without challenges other than scanning  environment while  ambulating on even surface   Stairs             Wheelchair Mobility     Tilt Bed    Modified Rankin (Stroke Patients Only)       Balance Overall balance assessment: Needs assistance Sitting-balance support: No upper extremity supported, Feet supported Sitting balance-Leahy Scale: Good     Standing balance support: No upper extremity supported, Bilateral upper extremity supported, During functional activity Standing balance-Leahy Scale: Fair Standing balance comment: able to stand statically without UE support, reliant on RW to ambulate                            Communication Communication Communication: Impaired Factors Affecting Communication: Hearing impaired (mildly)  Cognition Arousal: Alert Behavior During Therapy: WFL for tasks assessed/performed   PT - Cognitive impairments: Safety/Judgement, Awareness, Memory                       PT - Cognition Comments: STM deficits noted with pt repeating comments and trailing off in thought at times. Pt acknowledged having memory deficits. Decreased insight into safety as pt stated he was going to sit himself down on the ground when notified to turn around before he gets too tired due to lack of chairs to use in hall. Following commands: Impaired Following commands impaired: Follows multi-step commands with increased time    Cueing Cueing Techniques: Verbal cues  Exercises Other Exercises Other Exercises: x10 serial sit <> stands with supervision, 8/10 with hands on chest Other Exercises: cervical extension with scapular adduction AROM, x10 sitting EOB    General Comments General comments (skin integrity, edema, etc.): discussed using an attachable basket to RW to carry objects, but pt stated that would not fit on bus; discussed using back pack to carry objects but pt already has one and it is full so cannot carry much more purchased or new items with him      Pertinent Vitals/Pain Pain  Assessment Pain Assessment: Faces Faces Pain Scale: Hurts Niccoli more Pain Location: back (chronic) Pain Descriptors / Indicators: Discomfort, Sore Pain Intervention(s): Limited activity within patient's tolerance, Monitored during session, Repositioned    Home Living                          Prior Function            PT Goals (current goals can now be found in the care plan section) Acute Rehab PT Goals Patient Stated Goal: get out to community and not have to come back PT Goal Formulation: With patient Time For Goal Achievement: 01/31/24 Potential to Achieve Goals: Good Progress towards PT goals: Progressing toward goals    Frequency    Min 1X/week      PT Plan      Co-evaluation              AM-PAC PT 6 Clicks Mobility   Outcome Measure  Help needed turning from your back to your side while in a flat bed without using bedrails?: None Help needed moving from lying on your back to sitting on the side of a flat bed without using bedrails?: None Help needed moving to and from a bed to a chair (including a wheelchair)?: A Platt Help needed standing up from a chair using your arms (e.g., wheelchair or bedside chair)?: A Herbison Help needed to walk in  hospital room?: A Presutti Help needed climbing 3-5 steps with a railing? : A Hinch 6 Click Score: 20    End of Session Equipment Utilized During Treatment: Gait belt Activity Tolerance: Patient tolerated treatment well Patient left: in bed;with call bell/phone within reach;with bed alarm set Nurse Communication: Mobility status PT Visit Diagnosis: Other abnormalities of gait and mobility (R26.89);Other symptoms and signs involving the nervous system (R29.898);Unsteadiness on feet (R26.81)     Time: 8642-8572 PT Time Calculation (min) (ACUTE ONLY): 30 min  Charges:    $Therapeutic Exercise: 8-22 mins $Therapeutic Activity: 8-22 mins PT General Charges $$ ACUTE PT VISIT: 1 Visit                      Theo Ferretti, PT, DPT Acute Rehabilitation Services  Office: [phone]    Theo CHRISTELLA Ferretti 01/20/2024, 6:30 PM

## 2024-01-21 DIAGNOSIS — E162 Hypoglycemia, unspecified: Secondary | ICD-10-CM | POA: Diagnosis not present

## 2024-01-21 LAB — GLUCOSE, CAPILLARY
Glucose-Capillary: 95 mg/dL (ref 70–99)
Glucose-Capillary: 92 mg/dL (ref 70–99)
Glucose-Capillary: 114 mg/dL — ABNORMAL HIGH (ref 70–99)
Glucose-Capillary: 93 mg/dL (ref 70–99)

## 2024-01-21 NOTE — Plan of Care (Signed)

## 2024-01-21 NOTE — Plan of Care (Signed)

## 2024-01-21 NOTE — Progress Notes (Signed)
 PROGRESS NOTE  Anthony A Mcginniss Jr. FMW:[id number] DOB: 1955-11-14 DOA: 12/07/2023 PCP: Pcp, No   LOS: 44 days   Brief Narrative / Interim history: 68 y.o. male with PMH significant for homelessness, chronic alcoholism c/b withdrawals/DTs, chronic smoking, HTN, paroxysmal A-fib, h/o PE previously on Eliquis , hiatal hernia s/p Nissen fundoplication 2016, chronic low back pain, chronic anxiety, opiate withdrawal presented to the ED for confusion, low blood sugar. During this hospitalization, he has also been seen by critical care, CHF team, GI, neurology, psychiatry and palliative care. During this admission , had intermittent agitation- requiring Ativan  per CIWA protocol, transferred to PCCM  patient was placed on Precedex , Librium , clonazepam . Concluded treatment Patient was seen by GI, started on stress dose steroids.  Patient was also seen by advanced heart failure team for biventricular heart failure.  He was transferred to TRH service for further management of hypoglycemia, hyponatremia. Per psychiatry consultation, patient lacks decision-making capacity, APS involved for placement.  However this has now resolved.  APS denied guardianship.  Pt making decisions. Currently stable for DC, pending safe disposition.  Subjective / 24h Interval events: Comfortable, in bed, eating breakfast  Assesement and Plan: Principal Problem:   Hypoglycemia Active Problems:   Paroxysmal atrial fibrillation (HCC)   Hyponatremia   HTN (hypertension)   Macrocytic anemia   History of pulmonary embolus (PE)   Alcohol  withdrawal (HCC)   Alcohol  withdrawal delirium (HCC)   Moderate protein malnutrition (HCC)   Lactic acidosis   Acute delirium   Atrial fibrillation with RVR (HCC)   Principal problem SIRS with hypotension -  Resolved.  Completed empiric 5-day course of Zosyn .  He is afebrile now   Active problems Acute metabolic encephalopathy secondary to alcohol  withdrawal - Previously in the ICU getting  Precedex .  Vitamin supplementation.  Resolved, alert, appropriate   Hospital delirium -resolved, back to baseline   Retroperitoneal hematoma/chronic iron  deficiency anemia/acute blood loss anemia - S/p 5 units total RBCs during course of hospital stay.  Hemoglobin stable.  No active bleeding.  Continue Eliquis    A-fib with RVR -  Initially on Cardizem  drip.  Subsequently discontinued, transition to amiodarone  p.o. Eliquis .  Rate with decent control, low 100s   HFrEF -  EF 30-40%.  GDMT limited by hypotension.   Hyponatremia -mild, overall stable, sodium normalized today   Hypokalemia/hypomagnesemia -replace magnesium  again today.  Potassium stable   Goals of care - Patient is DNR.  Patient was homeless, TOC working on dispo discharge disposition.  APS involved however now stating they cannot pursue guardianship.  Per psychiatry 6/15 the patient lacks capacity for decision-making.  However, pt is now very conversive, oriented, able to make his own decisions.  Appears patient does not want to pursue ALF and stated he would walk out if placed there.  Is willing to go back to the community either aboard house or similar.  Working closely with TOC on disposition planning aligning with the patient's goals.  Appears TOC and patient have some leads on possible boardinghouse, [place] [facility] with numbers to call.  Scheduled Meds:  amiodarone   200 mg Oral BID   apixaban   5 mg Oral BID   ascorbic acid   500 mg Oral Daily   feeding supplement  237 mL Oral Q24H   Gerhardt's butt cream   Topical BID   Glycerin  (Adult)  1 suppository Rectal Once   lidocaine   1 patch Transdermal Q24H   multivitamin with minerals  1 tablet Oral Daily   nutrition supplement (JUVEN)  1 packet Oral BID BM   mouth rinse  15 mL Mouth Rinse 4 times per day   pantoprazole   40 mg Oral Daily   senna-docusate  1 tablet Oral QHS   sodium chloride  flush  10-40 mL Intracatheter Q12H   sodium chloride   1 g Oral TID WC   urea   15 g Oral  BID   zinc  sulfate (50mg  elemental zinc )  220 mg Oral Daily   Continuous Infusions: PRN Meds:.acetaminophen , metoprolol  tartrate, naphazoline-glycerin , OLANZapine  zydis, ondansetron  (ZOFRAN ) IV, mouth rinse  Current Outpatient Medications  Medication Instructions   acetaminophen  (TYLENOL ) 500 mg, Oral, Every 8 hours PRN   Eliquis  5 mg, Oral, 2 times daily   FeroSul 325 mg, Oral, Daily with breakfast   folic acid  (FOLVITE ) 1 mg, Oral, Daily   magnesium  oxide (MAG-OX) 400 mg, Oral, 2 times daily   metoprolol  succinate (TOPROL -XL) 25 mg, Oral, Daily   mometasone -formoterol  (DULERA ) 200-5 MCG/ACT AERO 2 puffs, Inhalation, 2 times daily   OLANZapine  (ZYPREXA ) 2.5 mg, Oral, Daily after lunch   OLANZapine  (ZYPREXA ) 5 mg, Oral, Daily at bedtime   pantoprazole  (PROTONIX ) 40 mg, Oral, Daily   rosuvastatin  (CRESTOR ) 10 mg, Oral, Daily   thiamine  (VITAMIN B1) 100 mg, Oral, Daily    Diet Orders (From admission, onward)     Start     Ordered   01/06/24 1359  Diet regular Room service appropriate? Yes; Fluid consistency: Thin  Diet effective now       Question Answer Comment  Room service appropriate? Yes   Fluid consistency: Thin      01/06/24 1358            DVT prophylaxis:  apixaban  (ELIQUIS ) tablet 5 mg   Lab Results  Component Value Date   PLT 298 01/20/2024      Code Status: Limited: Do not attempt resuscitation (DNR) -DNR-LIMITED -Do Not Intubate/DNI   Family Communication: no family at bedside  Status is: Inpatient Remains inpatient appropriate because: severity of illness  Level of care: Med-Surg  Consultants:  none  Objective: Vitals:   01/20/24 1620 01/20/24 2009 01/21/24 0504 01/21/24 0836  BP: 97/72 (!) 105/58 101/80 103/88  Pulse: 96 99 84 89  Resp: 19 18 20 17   Temp: 97.8 F (36.6 C) 97.6 F (36.4 C) 98.1 F (36.7 C) 97.7 F (36.5 C)  TempSrc:  Oral  Oral  SpO2: 97% 100% 99% 100%  Weight:      Height:        Intake/Output Summary (Last 24  hours) at 01/21/2024 0936 Last data filed at 01/21/2024 9161 Gross per 24 hour  Intake --  Output 1775 ml  Net -1775 ml   Wt Readings from Last 3 Encounters:  01/20/24 64.9 kg  07/28/23 64.7 kg  07/12/23 67.1 kg    Examination:  Constitutional: NAD  Data Reviewed: I have independently reviewed following labs and imaging studies   CBC Recent Labs  Lab 01/16/24 0614 01/18/24 0407 01/20/24 0258  WBC 5.8 5.1 4.9  HGB 12.4* 11.9* 11.5*  HCT 38.0* 36.7* 35.1*  PLT 308 302 298  MCV 98.2 96.8 97.8  MCH 32.0 31.4 32.0  MCHC 32.6 32.4 32.8  RDW 19.2* 19.0* 18.7*    Recent Labs  Lab 01/15/24 0412 01/16/24 0614 01/17/24 0401 01/18/24 0407 01/20/24 0258  NA 136 133* 134* 135 135  K 3.7 4.0 3.7 3.8 3.6  CL 104 99 101 104 105  CO2 23 24 24 23  21*  GLUCOSE 91  89 88 83 79  BUN 34* 38* 48* 49* 36*  CREATININE 0.65 0.59* 0.63 0.61 0.57*  CALCIUM  9.7 9.4 9.5 9.6 9.5  AST  --   --   --   --  36  ALT  --   --   --   --  32  ALKPHOS  --   --   --   --  91  BILITOT  --   --   --   --  0.4  ALBUMIN   --   --   --   --  3.0*  MG 1.5* 1.6* 1.7 1.5* 1.4*    ------------------------------------------------------------------------------------------------------------------ No results for input(s): CHOL, HDL, LDLCALC, TRIG, CHOLHDL, LDLDIRECT in the last 72 hours.  Lab Results  Component Value Date   HGBA1C 5.2 02/06/2023   ------------------------------------------------------------------------------------------------------------------ No results for input(s): TSH, T4TOTAL, T3FREE, THYROIDAB in the last 72 hours.  Invalid input(s): FREET3  Cardiac Enzymes No results for input(s): CKMB, TROPONINI, MYOGLOBIN in the last 168 hours.  Invalid input(s): CK ------------------------------------------------------------------------------------------------------------------    Component Value Date/Time   BNP 265.6 (H) 01/02/2024 0008    CBG: Recent Labs   Lab 01/20/24 0924 01/20/24 1315 01/20/24 1621 01/20/24 2010 01/21/24 0832  GLUCAP 110* 104* 123* 102* 95    No results found for this or any previous visit (from the past 240 hours).   Radiology Studies: No results found.   Nilda Fendt, MD, PhD [facility]  Between 7 am - 7 pm I am available, please contact me via Amion (for emergencies) or Securechat (non urgent messages)  Between 7 pm - 7 am I am not available, please contact night coverage MD/APP via Amion

## 2024-01-22 DIAGNOSIS — E162 Hypoglycemia, unspecified: Secondary | ICD-10-CM | POA: Diagnosis not present

## 2024-01-22 LAB — GLUCOSE, CAPILLARY
Glucose-Capillary: 86 mg/dL (ref 70–99)
Glucose-Capillary: 96 mg/dL (ref 70–99)
Glucose-Capillary: 111 mg/dL — ABNORMAL HIGH (ref 70–99)

## 2024-01-22 NOTE — Progress Notes (Signed)
 PROGRESS NOTE  Anthony A Olveda Jr. FMW:[id number] DOB: 01-Nov-1955 DOA: 12/07/2023 PCP: Pcp, No   LOS: 45 days   Brief Narrative / Interim history: 68 y.o. male with PMH significant for homelessness, chronic alcoholism c/b withdrawals/DTs, chronic smoking, HTN, paroxysmal A-fib, h/o PE previously on Eliquis , hiatal hernia s/p Nissen fundoplication 2016, chronic low back pain, chronic anxiety, opiate withdrawal presented to the ED for confusion, low blood sugar. During this hospitalization, he has also been seen by critical care, CHF team, GI, neurology, psychiatry and palliative care. During this admission , had intermittent agitation- requiring Ativan  per CIWA protocol, transferred to PCCM  patient was placed on Precedex , Librium , clonazepam . Concluded treatment Patient was seen by GI, started on stress dose steroids.  Patient was also seen by advanced heart failure team for biventricular heart failure.  He was transferred to TRH service for further management of hypoglycemia, hyponatremia. Per psychiatry consultation, patient lacks decision-making capacity, APS involved for placement.  However this has now resolved.  APS denied guardianship.  Pt making decisions. Currently stable for DC, pending safe disposition.  Subjective / 24h Interval events: Didn't sleep very well last night, feels sleepy this morning  Assesement and Plan: Principal Problem:   Hypoglycemia Active Problems:   Paroxysmal atrial fibrillation (HCC)   Hyponatremia   HTN (hypertension)   Macrocytic anemia   History of pulmonary embolus (PE)   Alcohol  withdrawal (HCC)   Alcohol  withdrawal delirium (HCC)   Moderate protein malnutrition (HCC)   Lactic acidosis   Acute delirium   Atrial fibrillation with RVR (HCC)   Principal problem SIRS with hypotension -  Resolved.  Completed empiric 5-day course of Zosyn .  Remains afebrile and stable   Active problems Acute metabolic encephalopathy secondary to alcohol  withdrawal -  Previously in the ICU getting Precedex .  Vitamin supplementation.  Resolved, alert, appropriate   Hospital delirium -resolved, back to baseline   Retroperitoneal hematoma/chronic iron  deficiency anemia/acute blood loss anemia - S/p 5 units total RBCs during course of hospital stay.  Hemoglobin stable.  No active bleeding.  Continue Eliquis    A-fib with RVR -  Initially on Cardizem  drip.  Subsequently discontinued, transition to amiodarone  p.o. Eliquis .  Rate with decent control, low 100s   HFrEF -  EF 30-40%.  GDMT limited by hypotension.   Hyponatremia -mild, overall stable, sodium normalized today   Hypokalemia/hypomagnesemia -replace magnesium  again today.  Potassium stable   Goals of care - Patient is DNR.  Patient was homeless, TOC working on dispo discharge disposition.  APS involved however now stating they cannot pursue guardianship.  Per psychiatry 6/15 the patient lacks capacity for decision-making.  However, pt is now very conversive, oriented, able to make his own decisions.  Appears patient does not want to pursue ALF and stated he would walk out if placed there.  Is willing to go back to the community either aboard house or similar.  Working closely with TOC on disposition planning aligning with the patient's goals.  Appears TOC and patient have some leads on possible boardinghouse, [place] [facility] with numbers to call.  Scheduled Meds:  amiodarone   200 mg Oral BID   apixaban   5 mg Oral BID   ascorbic acid   500 mg Oral Daily   feeding supplement  237 mL Oral Q24H   Gerhardt's butt cream   Topical BID   Glycerin  (Adult)  1 suppository Rectal Once   lidocaine   1 patch Transdermal Q24H   multivitamin with minerals  1 tablet Oral Daily  nutrition supplement (JUVEN)  1 packet Oral BID BM   mouth rinse  15 mL Mouth Rinse 4 times per day   pantoprazole   40 mg Oral Daily   senna-docusate  1 tablet Oral QHS   sodium chloride  flush  10-40 mL Intracatheter Q12H   sodium chloride   1 g  Oral TID WC   urea   15 g Oral BID   zinc  sulfate (50mg  elemental zinc )  220 mg Oral Daily   Continuous Infusions: PRN Meds:.acetaminophen , metoprolol  tartrate, naphazoline-glycerin , OLANZapine  zydis, ondansetron  (ZOFRAN ) IV, mouth rinse  Current Outpatient Medications  Medication Instructions   acetaminophen  (TYLENOL ) 500 mg, Oral, Every 8 hours PRN   Eliquis  5 mg, Oral, 2 times daily   FeroSul 325 mg, Oral, Daily with breakfast   folic acid  (FOLVITE ) 1 mg, Oral, Daily   magnesium  oxide (MAG-OX) 400 mg, Oral, 2 times daily   metoprolol  succinate (TOPROL -XL) 25 mg, Oral, Daily   mometasone -formoterol  (DULERA ) 200-5 MCG/ACT AERO 2 puffs, Inhalation, 2 times daily   OLANZapine  (ZYPREXA ) 2.5 mg, Oral, Daily after lunch   OLANZapine  (ZYPREXA ) 5 mg, Oral, Daily at bedtime   pantoprazole  (PROTONIX ) 40 mg, Oral, Daily   rosuvastatin  (CRESTOR ) 10 mg, Oral, Daily   thiamine  (VITAMIN B1) 100 mg, Oral, Daily    Diet Orders (From admission, onward)     Start     Ordered   01/06/24 1359  Diet regular Room service appropriate? Yes; Fluid consistency: Thin  Diet effective now       Question Answer Comment  Room service appropriate? Yes   Fluid consistency: Thin      01/06/24 1358            DVT prophylaxis:  apixaban  (ELIQUIS ) tablet 5 mg   Lab Results  Component Value Date   PLT 298 01/20/2024      Code Status: Limited: Do not attempt resuscitation (DNR) -DNR-LIMITED -Do Not Intubate/DNI   Family Communication: no family at bedside  Status is: Inpatient Remains inpatient appropriate because: severity of illness  Level of care: Med-Surg  Consultants:  none  Objective: Vitals:   01/21/24 2029 01/22/24 0444 01/22/24 0500 01/22/24 0752  BP: 108/86 (!) 120/91  130/87  Pulse: (!) 102 98  98  Resp: 19 18  18   Temp: 98 F (36.7 C) 97.6 F (36.4 C)  98 F (36.7 C)  TempSrc: Oral Oral    SpO2: 99% 99%  100%  Weight:   65.8 kg   Height:        Intake/Output Summary  (Last 24 hours) at 01/22/2024 1029 Last data filed at 01/22/2024 0800 Gross per 24 hour  Intake --  Output 2030 ml  Net -2030 ml   Wt Readings from Last 3 Encounters:  01/22/24 65.8 kg  07/28/23 64.7 kg  07/12/23 67.1 kg    Examination:  Constitutional: NAD  Data Reviewed: I have independently reviewed following labs and imaging studies   CBC Recent Labs  Lab 01/16/24 0614 01/18/24 0407 01/20/24 0258  WBC 5.8 5.1 4.9  HGB 12.4* 11.9* 11.5*  HCT 38.0* 36.7* 35.1*  PLT 308 302 298  MCV 98.2 96.8 97.8  MCH 32.0 31.4 32.0  MCHC 32.6 32.4 32.8  RDW 19.2* 19.0* 18.7*    Recent Labs  Lab 01/16/24 0614 01/17/24 0401 01/18/24 0407 01/20/24 0258  NA 133* 134* 135 135  K 4.0 3.7 3.8 3.6  CL 99 101 104 105  CO2 24 24 23  21*  GLUCOSE 89 88 83 79  BUN 38* 48* 49* 36*  CREATININE 0.59* 0.63 0.61 0.57*  CALCIUM  9.4 9.5 9.6 9.5  AST  --   --   --  36  ALT  --   --   --  32  ALKPHOS  --   --   --  91  BILITOT  --   --   --  0.4  ALBUMIN   --   --   --  3.0*  MG 1.6* 1.7 1.5* 1.4*    ------------------------------------------------------------------------------------------------------------------ No results for input(s): CHOL, HDL, LDLCALC, TRIG, CHOLHDL, LDLDIRECT in the last 72 hours.  Lab Results  Component Value Date   HGBA1C 5.2 02/06/2023   ------------------------------------------------------------------------------------------------------------------ No results for input(s): TSH, T4TOTAL, T3FREE, THYROIDAB in the last 72 hours.  Invalid input(s): FREET3  Cardiac Enzymes No results for input(s): CKMB, TROPONINI, MYOGLOBIN in the last 168 hours.  Invalid input(s): CK ------------------------------------------------------------------------------------------------------------------    Component Value Date/Time   BNP 265.6 (H) 01/02/2024 0008    CBG: Recent Labs  Lab 01/21/24 0832 01/21/24 1120 01/21/24 1544 01/21/24 2054  01/22/24 0818  GLUCAP 95 92 114* 93 86    No results found for this or any previous visit (from the past 240 hours).   Radiology Studies: No results found.   Nilda Fendt, MD, PhD [facility]  Between 7 am - 7 pm I am available, please contact me via Amion (for emergencies) or Securechat (non urgent messages)  Between 7 pm - 7 am I am not available, please contact night coverage MD/APP via Amion

## 2024-01-22 NOTE — Plan of Care (Signed)

## 2024-01-23 DIAGNOSIS — E162 Hypoglycemia, unspecified: Secondary | ICD-10-CM | POA: Diagnosis not present

## 2024-01-23 LAB — GLUCOSE, CAPILLARY
Glucose-Capillary: 88 mg/dL (ref 70–99)
Glucose-Capillary: 116 mg/dL — ABNORMAL HIGH (ref 70–99)
Glucose-Capillary: 109 mg/dL — ABNORMAL HIGH (ref 70–99)
Glucose-Capillary: 121 mg/dL — ABNORMAL HIGH (ref 70–99)

## 2024-01-23 NOTE — Plan of Care (Signed)

## 2024-01-23 NOTE — Progress Notes (Signed)
 PROGRESS NOTE  Anthony A Bon Jr. FMW:[id number] DOB: [birth year] DOA: 12/07/2023 PCP: Pcp, No   LOS: 46 days   Brief Narrative / Interim history: 68 y.o. male with PMH significant for homelessness, chronic alcoholism c/b withdrawals/DTs, chronic smoking, HTN, paroxysmal A-fib, h/o PE previously on Eliquis , hiatal hernia s/p Nissen fundoplication 2016, chronic low back pain, chronic anxiety, opiate withdrawal presented to the ED for confusion, low blood sugar. During this hospitalization, he has also been seen by critical care, CHF team, GI, neurology, psychiatry and palliative care. During this admission , had intermittent agitation- requiring Ativan  per CIWA protocol, transferred to PCCM  patient was placed on Precedex , Librium , clonazepam . Concluded treatment Patient was seen by GI, started on stress dose steroids.  Patient was also seen by advanced heart failure team for biventricular heart failure.  He was transferred to TRH service for further management of hypoglycemia, hyponatremia. Per psychiatry consultation, patient lacks decision-making capacity, APS involved for placement.  However this has now resolved.  APS denied guardianship.  Pt making decisions. Currently stable for DC, pending safe disposition.  Subjective / 24h Interval events: In bed, watching TV.  Comfortable  Assesement and Plan: Principal Problem:   Hypoglycemia Active Problems:   Paroxysmal atrial fibrillation (HCC)   Hyponatremia   HTN (hypertension)   Macrocytic anemia   History of pulmonary embolus (PE)   Alcohol  withdrawal (HCC)   Alcohol  withdrawal delirium (HCC)   Moderate protein malnutrition (HCC)   Lactic acidosis   Acute delirium   Atrial fibrillation with RVR (HCC)   Principal problem SIRS with hypotension -  Resolved.  Completed empiric 5-day course of Zosyn .  Remains afebrile and stable   Active problems Acute metabolic encephalopathy secondary to alcohol  withdrawal - Previously in the ICU getting  Precedex .  Vitamin supplementation.  Resolved, alert, appropriate   Hospital delirium -resolved, back to baseline   Retroperitoneal hematoma/chronic iron  deficiency anemia/acute blood loss anemia - S/p 5 units total RBCs during course of hospital stay.  Hemoglobin stable.  No active bleeding.  Continue Eliquis    A-fib with RVR -  Initially on Cardizem  drip.  Subsequently discontinued, transition to amiodarone  p.o. Eliquis .  Rate with decent control, low 100s   HFrEF -  EF 30-40%.  GDMT limited by hypotension.   Hyponatremia -mild, overall stable, sodium normalized today   Hypokalemia/hypomagnesemia -replace magnesium  again today.  Potassium stable   Goals of care - Patient is DNR.  Patient was homeless, TOC working on dispo discharge disposition.  APS involved however now stating they cannot pursue guardianship.  Per psychiatry 6/15 the patient lacks capacity for decision-making.  However, pt is now very conversive, oriented, able to make his own decisions.  Appears patient does not want to pursue ALF and stated he would walk out if placed there.  Is willing to go back to the community either aboard house or similar.  Working closely with TOC on disposition planning aligning with the patient's goals.  Appears TOC and patient have some leads on possible boardinghouse, [place] [facility] with numbers to call.  Scheduled Meds:  amiodarone   200 mg Oral BID   apixaban   5 mg Oral BID   ascorbic acid   500 mg Oral Daily   feeding supplement  237 mL Oral Q24H   Gerhardt's butt cream   Topical BID   Glycerin  (Adult)  1 suppository Rectal Once   lidocaine   1 patch Transdermal Q24H   multivitamin with minerals  1 tablet Oral Daily   nutrition supplement (JUVEN)  1 packet Oral BID BM   mouth rinse  15 mL Mouth Rinse 4 times per day   pantoprazole   40 mg Oral Daily   senna-docusate  1 tablet Oral QHS   sodium chloride  flush  10-40 mL Intracatheter Q12H   sodium chloride   1 g Oral TID WC   urea   15 g Oral  BID   zinc  sulfate (50mg  elemental zinc )  220 mg Oral Daily   Continuous Infusions: PRN Meds:.acetaminophen , metoprolol  tartrate, naphazoline-glycerin , OLANZapine  zydis, ondansetron  (ZOFRAN ) IV, mouth rinse  Current Outpatient Medications  Medication Instructions   acetaminophen  (TYLENOL ) 500 mg, Oral, Every 8 hours PRN   Eliquis  5 mg, Oral, 2 times daily   FeroSul 325 mg, Oral, Daily with breakfast   folic acid  (FOLVITE ) 1 mg, Oral, Daily   magnesium  oxide (MAG-OX) 400 mg, Oral, 2 times daily   metoprolol  succinate (TOPROL -XL) 25 mg, Oral, Daily   mometasone -formoterol  (DULERA ) 200-5 MCG/ACT AERO 2 puffs, Inhalation, 2 times daily   OLANZapine  (ZYPREXA ) 2.5 mg, Oral, Daily after lunch   OLANZapine  (ZYPREXA ) 5 mg, Oral, Daily at bedtime   pantoprazole  (PROTONIX ) 40 mg, Oral, Daily   rosuvastatin  (CRESTOR ) 10 mg, Oral, Daily   thiamine  (VITAMIN B1) 100 mg, Oral, Daily    Diet Orders (From admission, onward)     Start     Ordered   01/06/24 1359  Diet regular Room service appropriate? Yes; Fluid consistency: Thin  Diet effective now       Question Answer Comment  Room service appropriate? Yes   Fluid consistency: Thin      01/06/24 1358            DVT prophylaxis:  apixaban  (ELIQUIS ) tablet 5 mg   Lab Results  Component Value Date   PLT 298 01/20/2024      Code Status: Limited: Do not attempt resuscitation (DNR) -DNR-LIMITED -Do Not Intubate/DNI   Family Communication: no family at bedside  Status is: Inpatient Remains inpatient appropriate because: severity of illness  Level of care: Med-Surg  Consultants:  none  Objective: Vitals:   01/22/24 0500 01/22/24 0752 01/22/24 1548 01/23/24 0800  BP:  130/87 94/64 122/86  Pulse:  98 99 100  Resp:  18 18 18   Temp:  98 F (36.7 C) 98.1 F (36.7 C) 97.8 F (36.6 C)  TempSrc:   Oral Oral  SpO2:  100% 100% 100%  Weight: 65.8 kg     Height:        Intake/Output Summary (Last 24 hours) at 01/23/2024 0954 Last  data filed at 01/22/2024 2138 Gross per 24 hour  Intake --  Output 925 ml  Net -925 ml   Wt Readings from Last 3 Encounters:  01/22/24 65.8 kg  07/28/23 64.7 kg  07/12/23 67.1 kg    Examination:  Constitutional: He is in no distress  Data Reviewed: I have independently reviewed following labs and imaging studies   CBC Recent Labs  Lab 01/18/24 0407 01/20/24 0258  WBC 5.1 4.9  HGB 11.9* 11.5*  HCT 36.7* 35.1*  PLT 302 298  MCV 96.8 97.8  MCH 31.4 32.0  MCHC 32.4 32.8  RDW 19.0* 18.7*    Recent Labs  Lab 01/17/24 0401 01/18/24 0407 01/20/24 0258  NA 134* 135 135  K 3.7 3.8 3.6  CL 101 104 105  CO2 24 23 21*  GLUCOSE 88 83 79  BUN 48* 49* 36*  CREATININE 0.63 0.61 0.57*  CALCIUM  9.5 9.6 9.5  AST  --   --  36  ALT  --   --  32  ALKPHOS  --   --  91  BILITOT  --   --  0.4  ALBUMIN   --   --  3.0*  MG 1.7 1.5* 1.4*    ------------------------------------------------------------------------------------------------------------------ No results for input(s): CHOL, HDL, LDLCALC, TRIG, CHOLHDL, LDLDIRECT in the last 72 hours.  Lab Results  Component Value Date   HGBA1C 5.2 02/06/2023   ------------------------------------------------------------------------------------------------------------------ No results for input(s): TSH, T4TOTAL, T3FREE, THYROIDAB in the last 72 hours.  Invalid input(s): FREET3  Cardiac Enzymes No results for input(s): CKMB, TROPONINI, MYOGLOBIN in the last 168 hours.  Invalid input(s): CK ------------------------------------------------------------------------------------------------------------------    Component Value Date/Time   BNP 265.6 (H) 01/02/2024 0008    CBG: Recent Labs  Lab 01/21/24 2054 01/22/24 0818 01/22/24 1121 01/22/24 1546 01/23/24 0809  GLUCAP 93 86 96 111* 88    No results found for this or any previous visit (from the past 240 hours).   Radiology Studies: No results  found.   Nilda Fendt, MD, PhD [facility]  Between 7 am - 7 pm I am available, please contact me via Amion (for emergencies) or Securechat (non urgent messages)  Between 7 pm - 7 am I am not available, please contact night coverage MD/APP via Amion

## 2024-01-24 DIAGNOSIS — E162 Hypoglycemia, unspecified: Secondary | ICD-10-CM | POA: Diagnosis not present

## 2024-01-24 LAB — GLUCOSE, CAPILLARY
Glucose-Capillary: 93 mg/dL (ref 70–99)
Glucose-Capillary: 113 mg/dL — ABNORMAL HIGH (ref 70–99)
Glucose-Capillary: 101 mg/dL — ABNORMAL HIGH (ref 70–99)
Glucose-Capillary: 117 mg/dL — ABNORMAL HIGH (ref 70–99)

## 2024-01-24 NOTE — Progress Notes (Signed)
 PROGRESS NOTE  Anthony A Davalos Jr. FMW:[id number] DOB: January 16, 1956 DOA: 12/07/2023 PCP: Pcp, No   LOS: 47 days   Brief Narrative / Interim history: 68 y.o. male with PMH significant for homelessness, chronic alcoholism c/b withdrawals/DTs, chronic smoking, HTN, paroxysmal A-fib, h/o PE previously on Eliquis , hiatal hernia s/p Nissen fundoplication 2016, chronic low back pain, chronic anxiety, opiate withdrawal presented to the ED for confusion, low blood sugar. During this hospitalization, he has also been seen by critical care, CHF team, GI, neurology, psychiatry and palliative care. During this admission , had intermittent agitation- requiring Ativan  per CIWA protocol, transferred to PCCM  patient was placed on Precedex , Librium , clonazepam . Concluded treatment Patient was seen by GI, started on stress dose steroids.  Patient was also seen by advanced heart failure team for biventricular heart failure.  He was transferred to TRH service for further management of hypoglycemia, hyponatremia. Per psychiatry consultation, patient lacks decision-making capacity, APS involved for placement.  However this has now resolved.  APS denied guardianship.  Pt making decisions. Currently stable for DC, pending safe disposition.  Subjective / 24h Interval events: In bed, no complaints  Assesement and Plan: Principal Problem:   Hypoglycemia Active Problems:   Paroxysmal atrial fibrillation (HCC)   Hyponatremia   HTN (hypertension)   Macrocytic anemia   History of pulmonary embolus (PE)   Alcohol  withdrawal (HCC)   Alcohol  withdrawal delirium (HCC)   Moderate protein malnutrition (HCC)   Lactic acidosis   Acute delirium   Atrial fibrillation with RVR (HCC)   Principal problem SIRS with hypotension -  Resolved.  Completed empiric 5-day course of Zosyn .  Remains afebrile and stable   Active problems Acute metabolic encephalopathy secondary to alcohol  withdrawal - Previously in the ICU getting Precedex .   Vitamin supplementation.  Resolved, alert, appropriate   Hospital delirium -resolved, back to baseline   Retroperitoneal hematoma/chronic iron  deficiency anemia/acute blood loss anemia - S/p 5 units total RBCs during course of hospital stay.  Hemoglobin stable.  No active bleeding.  Continue Eliquis    A-fib with RVR -  Initially on Cardizem  drip.  Subsequently discontinued, transition to amiodarone  p.o. Eliquis .  Rate with decent control, low 100s   HFrEF -  EF 30-40%.  GDMT limited by hypotension.   Hyponatremia -mild, overall stable, sodium normalized today   Hypokalemia/hypomagnesemia -replace magnesium  again today.  Potassium stable   Goals of care - Patient is DNR.  Patient was homeless, TOC working on dispo discharge disposition.  APS involved however now stating they cannot pursue guardianship.  Per psychiatry 6/15 the patient lacks capacity for decision-making.  However, pt is now very conversive, oriented, able to make his own decisions.  Appears patient does not want to pursue ALF and stated he would walk out if placed there.  Is willing to go back to the community either aboard house or similar.  Working closely with TOC on disposition planning aligning with the patient's goals.   Scheduled Meds:  amiodarone   200 mg Oral BID   apixaban   5 mg Oral BID   ascorbic acid   500 mg Oral Daily   feeding supplement  237 mL Oral Q24H   Gerhardt's butt cream   Topical BID   Glycerin  (Adult)  1 suppository Rectal Once   lidocaine   1 patch Transdermal Q24H   multivitamin with minerals  1 tablet Oral Daily   nutrition supplement (JUVEN)  1 packet Oral BID BM   mouth rinse  15 mL Mouth Rinse 4 times per  day   pantoprazole   40 mg Oral Daily   senna-docusate  1 tablet Oral QHS   sodium chloride  flush  10-40 mL Intracatheter Q12H   sodium chloride   1 g Oral TID WC   urea   15 g Oral BID   Continuous Infusions: PRN Meds:.acetaminophen , metoprolol  tartrate, naphazoline-glycerin , OLANZapine   zydis, ondansetron  (ZOFRAN ) IV, mouth rinse  Current Outpatient Medications  Medication Instructions   acetaminophen  (TYLENOL ) 500 mg, Oral, Every 8 hours PRN   Eliquis  5 mg, Oral, 2 times daily   FeroSul 325 mg, Oral, Daily with breakfast   folic acid  (FOLVITE ) 1 mg, Oral, Daily   magnesium  oxide (MAG-OX) 400 mg, Oral, 2 times daily   metoprolol  succinate (TOPROL -XL) 25 mg, Oral, Daily   mometasone -formoterol  (DULERA ) 200-5 MCG/ACT AERO 2 puffs, Inhalation, 2 times daily   OLANZapine  (ZYPREXA ) 2.5 mg, Oral, Daily after lunch   OLANZapine  (ZYPREXA ) 5 mg, Oral, Daily at bedtime   pantoprazole  (PROTONIX ) 40 mg, Oral, Daily   rosuvastatin  (CRESTOR ) 10 mg, Oral, Daily   thiamine  (VITAMIN B1) 100 mg, Oral, Daily    Diet Orders (From admission, onward)     Start     Ordered   01/06/24 1359  Diet regular Room service appropriate? Yes; Fluid consistency: Thin  Diet effective now       Question Answer Comment  Room service appropriate? Yes   Fluid consistency: Thin      01/06/24 1358            DVT prophylaxis:  apixaban  (ELIQUIS ) tablet 5 mg   Lab Results  Component Value Date   PLT 298 01/20/2024      Code Status: Limited: Do not attempt resuscitation (DNR) -DNR-LIMITED -Do Not Intubate/DNI   Family Communication: no family at bedside  Status is: Inpatient Remains inpatient appropriate because: severity of illness  Level of care: Med-Surg  Consultants:  none  Objective: Vitals:   01/23/24 2206 01/23/24 2207 01/24/24 0438 01/24/24 0600  BP: 104/77 104/77  97/77  Pulse: (!) 104 (!) 108  92  Resp:      Temp: 98.1 F (36.7 C)   (!) 97.4 F (36.3 C)  TempSrc: Oral   Oral  SpO2: 98% 98%  100%  Weight:   64.8 kg   Height:        Intake/Output Summary (Last 24 hours) at 01/24/2024 1104 Last data filed at 01/24/2024 0600 Gross per 24 hour  Intake --  Output 950 ml  Net -950 ml   Wt Readings from Last 3 Encounters:  01/24/24 64.8 kg  07/28/23 64.7 kg   07/12/23 67.1 kg    Examination:  Constitutional: NAD  Data Reviewed: I have independently reviewed following labs and imaging studies   CBC Recent Labs  Lab 01/18/24 0407 01/20/24 0258  WBC 5.1 4.9  HGB 11.9* 11.5*  HCT 36.7* 35.1*  PLT 302 298  MCV 96.8 97.8  MCH 31.4 32.0  MCHC 32.4 32.8  RDW 19.0* 18.7*    Recent Labs  Lab 01/18/24 0407 01/20/24 0258  NA 135 135  K 3.8 3.6  CL 104 105  CO2 23 21*  GLUCOSE 83 79  BUN 49* 36*  CREATININE 0.61 0.57*  CALCIUM  9.6 9.5  AST  --  36  ALT  --  32  ALKPHOS  --  91  BILITOT  --  0.4  ALBUMIN   --  3.0*  MG 1.5* 1.4*    ------------------------------------------------------------------------------------------------------------------ No results for input(s): CHOL, HDL, LDLCALC, TRIG, CHOLHDL,  LDLDIRECT in the last 72 hours.  Lab Results  Component Value Date   HGBA1C 5.2 02/06/2023   ------------------------------------------------------------------------------------------------------------------ No results for input(s): TSH, T4TOTAL, T3FREE, THYROIDAB in the last 72 hours.  Invalid input(s): FREET3  Cardiac Enzymes No results for input(s): CKMB, TROPONINI, MYOGLOBIN in the last 168 hours.  Invalid input(s): CK ------------------------------------------------------------------------------------------------------------------    Component Value Date/Time   BNP 265.6 (H) 01/02/2024 0008    CBG: Recent Labs  Lab 01/23/24 0809 01/23/24 1118 01/23/24 1230 01/23/24 1659 01/24/24 0804  GLUCAP 88 116* 109* 121* 93    No results found for this or any previous visit (from the past 240 hours).   Radiology Studies: No results found.   Nilda Fendt, MD, PhD [facility]  Between 7 am - 7 pm I am available, please contact me via Amion (for emergencies) or Securechat (non urgent messages)  Between 7 pm - 7 am I am not available, please contact night coverage MD/APP  via Amion

## 2024-01-24 NOTE — Plan of Care (Signed)

## 2024-01-24 NOTE — TOC Progression Note (Addendum)
 Transition of Care (TOC) - Progression Note    Patient Details  Name: Anthony A Holstad Jr. MRN: [id number] Date of Birth: February 11, 1956  Transition of Care [facility]) CM/SW Contact  Anthony JONELLE Joe, RN Phone Number: 01/24/2024, 3:58 PM  Clinical Narrative:    CM called and spoke with Anthony [name], Owner of [facility] and she states that she has male bed opening and plans to meet with the patient this week to consider possible bed offers if patient is agreeable.  I spoke with Vincente SINNING MSW and she will follow up with [name] APS SW concerning patient's placement needs.  Patient remains inpatient since patient has memory issues and unable to discharge safely to the community/homeless shelter.  Clinicals were sent via secure email to [email or web address].   Expected Discharge Plan: Skilled Nursing Facility Barriers to Discharge: Continued Medical Work up  Expected Discharge Plan and Services In-house Referral: Clinical Social Work     Living arrangements for the past 2 months: Homeless                                       Social Determinants of Health (SDOH) Interventions SDOH Screenings   Food Insecurity: Patient Unable To Answer (12/09/2023)  Housing: Patient Unable To Answer (12/09/2023)  Transportation Needs: Patient Unable To Answer (12/09/2023)  Utilities: Patient Unable To Answer (12/09/2023)  Depression (PHQ2-9): Medium Risk (10/16/2020)  Social Connections: Unknown (12/22/2023)  Tobacco Use: High Risk (12/08/2023)    Readmission Risk Interventions    01/14/2024    9:48 AM 08/27/2023   12:21 PM 08/19/2023    9:17 AM  Readmission Risk Prevention Plan  Transportation Screening Complete Complete Complete  Medication Review Oceanographer) Complete Complete Complete  PCP or Specialist appointment within 3-5 days of discharge Complete Complete   HRI or Home Care Consult Complete Complete Complete  SW Recovery Care/Counseling Consult Complete Complete Complete   Palliative Care Screening Not Applicable Not Applicable Not Applicable  Skilled Nursing Facility Not Applicable Not Applicable Not Applicable

## 2024-01-24 NOTE — Progress Notes (Signed)
 Physical Therapy Treatment Patient Details Name: Anthony [name]. MRN: [id number] DOB: 06-12-1956 Today's Date: 01/24/2024   History of Present Illness Pt is a 68 y.o. male who presented 12/06/23 with SOB. Shortly after arriving, pt became agitated and was given IV Ativan . CT head is unremarkable. Patient admitted for EtOH withdrawal, acute delirium, and hypoglycemia. Cortrak placed 5/23 now discontinued.  The day before the patient had presented to ED requesting EtOH detox; patient was deemed to be not actively in EtOH withdrawal and was discharged, however upon discharge patient threatened staff and was escorted off of the premises by [employer]. PMH includes alcohol  abuse, HTN, CVA, HFmrEF, afib, PE, HLD, anxiety, depression, chronic low back pain.    PT Comments  Pt agreeable to therapy and would like to try without AD. Pt is mod I for bed mobility and supervision for transfers. Pt is CGA for ambulation without AD in hallway, but requires modA for wayfinding back to his room. Continue to work on upright posture, scapular depression and cervical retraction. Pt able to correct for short bouts before returning to his baseline posture. D/c plans remain appropriate. PT will continue to follow acutely.     If plan is discharge home, recommend the following: Assistance with cooking/housework;Direct supervision/assist for medications management;Direct supervision/assist for financial management;Assist for transportation;Help with stairs or ramp for entrance;Supervision due to cognitive status;A [place] help with walking and/or transfers;A [place] help with bathing/dressing/bathroom   Can travel by private vehicle     Yes  Equipment Recommendations  BSC/3in1;Rolling walker (2 wheels)    Recommendations for Other Services       Precautions / Restrictions Precautions Precautions: Fall Recall of Precautions/Restrictions: Impaired Restrictions Weight Bearing Restrictions Per Provider Order: No      Mobility  Bed Mobility Overal bed mobility: Modified Independent Bed Mobility: Supine to Sit, Sit to Supine     Supine to sit: Modified independent (Device/Increase time), HOB elevated, Used rails Sit to supine: Modified independent (Device/Increase time), Used rails, HOB elevated   General bed mobility comments: Mod I using bed rails    Transfers Overall transfer level: Needs assistance Equipment used: Rolling walker (2 wheels), None Transfers: Sit to/from Stand Sit to Stand: Supervision           General transfer comment: stands EoB with supervision and no RW    Ambulation/Gait Ambulation/Gait assistance: Supervision Gait Distance (Feet): 250 Feet Assistive device: Rolling walker (2 wheels) Gait Pattern/deviations: Step-through pattern, Trunk flexed, Decreased stride length Gait velocity: reduced Gait velocity interpretation: <1.8 ft/sec, indicate of risk for recurrent falls   General Gait Details: continued to work on correcting head forward posture with cues for scapular retraction. pt able to achieve more upright but states it is too hared to maintain         Balance Overall balance assessment: Needs assistance Sitting-balance support: No upper extremity supported, Feet supported Sitting balance-Leahy Scale: Good     Standing balance support: No upper extremity supported, Bilateral upper extremity supported, During functional activity Standing balance-Leahy Scale: Fair Standing balance comment: able to stand statically without UE support, reliant on RW to ambulate                            Communication Communication Communication: Impaired Factors Affecting Communication: Hearing impaired (mildly)  Cognition Arousal: Alert Behavior During Therapy: WFL for tasks assessed/performed   PT - Cognitive impairments: Safety/Judgement, Awareness, Memory  PT - Cognition Comments: pt with decreased wayfinding in  hallway Following commands: Impaired Following commands impaired: Follows multi-step commands with increased time    Cueing Cueing Techniques: Verbal cues  Exercises Other Exercises Other Exercises: scapular retraction x 10    General Comments General comments (skin integrity, edema, etc.): VSS on RA      Pertinent Vitals/Pain Pain Assessment Pain Assessment: Faces Faces Pain Scale: Hurts a [place] bit Pain Location: back (chronic) Pain Descriptors / Indicators: Discomfort, Sore Pain Intervention(s): Limited activity within patient's tolerance, Monitored during session, Repositioned     PT Goals (current goals can now be found in the care plan section) Acute Rehab PT Goals Patient Stated Goal: get out to community and not have to come back PT Goal Formulation: With patient Time For Goal Achievement: 01/31/24 Potential to Achieve Goals: Good Progress towards PT goals: Progressing toward goals    Frequency    Min 1X/week       AM-PAC PT 6 Clicks Mobility   Outcome Measure  Help needed turning from your back to your side while in a flat bed without using bedrails?: None Help needed moving from lying on your back to sitting on the side of a flat bed without using bedrails?: None Help needed moving to and from a bed to a chair (including a wheelchair)?: A [place] Help needed standing up from a chair using your arms (e.g., wheelchair or bedside chair)?: A [place] Help needed to walk in [name] room?: A [place] Help needed climbing 3-5 steps with a railing? : A [place] 6 Click Score: 20    End of Session Equipment Utilized During Treatment: Gait belt Activity Tolerance: Patient tolerated treatment well Patient left: in bed;with call bell/phone within reach;with bed alarm set Nurse Communication: Mobility status PT Visit Diagnosis: Other abnormalities of gait and mobility (R26.89);Other symptoms and signs involving the nervous system (R29.898);Unsteadiness on feet  (R26.81)     Time: 8284-8266 PT Time Calculation (min) (ACUTE ONLY): 18 min  Charges:    $Gait Training: 8-22 mins PT General Charges $$ ACUTE PT VISIT: 1 Visit                     Shacoya Burkhammer B. Fleeta Lapidus PT, DPT Acute Rehabilitation Services Please use secure chat or  Call Office [phone]    Anthony [name] 01/24/2024, 6:03 PM

## 2024-01-25 DIAGNOSIS — E162 Hypoglycemia, unspecified: Secondary | ICD-10-CM | POA: Diagnosis not present

## 2024-01-25 LAB — GLUCOSE, CAPILLARY
Glucose-Capillary: 86 mg/dL (ref 70–99)
Glucose-Capillary: 102 mg/dL — ABNORMAL HIGH (ref 70–99)
Glucose-Capillary: 101 mg/dL — ABNORMAL HIGH (ref 70–99)

## 2024-01-25 NOTE — Progress Notes (Signed)
 Occupational Therapy Treatment Patient Details Name: Anthony A Varnadore Jr. MRN: [id number] DOB: 1956-07-19 Today's Date: 01/25/2024   History of present illness Pt is a 68 y.o. male who presented 12/06/23 with SOB. Shortly after arriving, pt became agitated and was given IV Ativan . CT head is unremarkable. Patient admitted for EtOH withdrawal, acute delirium, and hypoglycemia. Cortrak placed 5/23 now discontinued.  The day before the patient had presented to ED requesting EtOH detox; patient was deemed to be not actively in EtOH withdrawal and was discharged, however upon discharge patient threatened staff and was escorted off of the premises by [employer]. PMH includes alcohol  abuse, HTN, CVA, HFmrEF, afib, PE, HLD, anxiety, depression, chronic low back pain.   OT comments  Patient continues to demonstrate good gains with OT treatment. Patient able to get to EOB without assistance and performed dressing with setup. Patient states he now taking himself to bathroom. Patient performed mobility in hallway with RW and supervision with verbal cues to navigate back to room. Acute OT to continue to follow to address established goals to facilitate DC to next venue of care.        If plan is discharge home, recommend the following:  Assist for transportation;Direct supervision/assist for medications management;A Stanke help with walking and/or transfers;A Staffieri help with bathing/dressing/bathroom;Supervision due to cognitive status   Equipment Recommendations  Other (comment) (defer)    Recommendations for Other Services      Precautions / Restrictions Precautions Precautions: Fall Recall of Precautions/Restrictions: Impaired Restrictions Weight Bearing Restrictions Per Provider Order: No       Mobility Bed Mobility Overal bed mobility: Modified Independent Bed Mobility: Supine to Sit, Sit to Supine     Supine to sit: Modified independent (Device/Increase time), HOB elevated, Used rails Sit to supine:  Modified independent (Device/Increase time), Used rails, HOB elevated   General bed mobility comments: Mod I using bed rails    Transfers Overall transfer level: Needs assistance Equipment used: Rolling walker (2 wheels), None Transfers: Sit to/from Stand Sit to Stand: Supervision           General transfer comment: supervision for safety with standing, mobility and transfers     Balance Overall balance assessment: Needs assistance Sitting-balance support: No upper extremity supported, Feet supported Sitting balance-Leahy Scale: Good     Standing balance support: No upper extremity supported, Bilateral upper extremity supported, During functional activity Standing balance-Leahy Scale: Fair Standing balance comment: able to stand statically without UE support, reliant on RW to ambulate                           ADL either performed or assessed with clinical judgement   ADL Overall ADL's : Needs assistance/impaired     Grooming: Modified independent;Standing           Upper Body Dressing : Set up;Sitting Upper Body Dressing Details (indicate cue type and reason): gown Lower Body Dressing: Set up;Sit to/from stand Lower Body Dressing Details (indicate cue type and reason): donned shoes and pants               General ADL Comments: patient states he has been taking himself to bathroom    Extremity/Trunk Assessment              Vision       Perception     Praxis     Communication Communication Communication: Impaired Factors Affecting Communication: Hearing impaired (mildly)   Cognition Arousal: Alert  Behavior During Therapy: WFL for tasks assessed/performed               OT - Cognition Comments: demonstrating imrpovement with cognition                 Following commands: Impaired Following commands impaired: Follows multi-step commands with increased time      Cueing   Cueing Techniques: Verbal cues  Exercises       Shoulder Instructions       General Comments VSS on RA    Pertinent Vitals/ Pain       Pain Assessment Pain Assessment: Faces Faces Pain Scale: Hurts a [place] bit Pain Location: back (chronic) Pain Descriptors / Indicators: Discomfort, Sore Pain Intervention(s): Limited activity within patient's tolerance, Monitored during session, Premedicated before session, Repositioned  Home Living                                          Prior Functioning/Environment              Frequency  Min 2X/week        Progress Toward Goals  OT Goals(current goals can now be found in the care plan section)  Progress towards OT goals: Progressing toward goals  Acute Rehab OT Goals Patient Stated Goal: to discharge from hospital OT Goal Formulation: With patient Time For Goal Achievement: 02/01/24 Potential to Achieve Goals: Good ADL Goals Pt Will Perform Grooming: with modified independence;standing Pt Will Perform Lower Body Dressing: with modified independence;sit to/from stand Pt Will Transfer to Toilet: regular height toilet;ambulating;with modified independence Additional ADL Goal #1: pt will perform pillbox assessment mod I for increased time.  Plan      Co-evaluation                 AM-PAC OT 6 Clicks Daily Activity     Outcome Measure   Help from another person eating meals?: None Help from another person taking care of personal grooming?: None Help from another person toileting, which includes using toliet, bedpan, or urinal?: A Lamboy Help from another person bathing (including washing, rinsing, drying)?: A Jakob Help from another person to put on and taking off regular upper body clothing?: A Thorstenson Help from another person to put on and taking off regular lower body clothing?: A Mazariego 6 Click Score: 20    End of Session Equipment Utilized During Treatment: Rolling walker (2 wheels)  OT Visit Diagnosis: Other symptoms and signs  involving cognitive function;Unsteadiness on feet (R26.81);Low vision, both eyes (H54.2)   Activity Tolerance Patient tolerated treatment well   Patient Left in bed;with call bell/phone within reach   Nurse Communication Mobility status        Time: 8899-8875 OT Time Calculation (min): 24 min  Charges: OT General Charges $OT Visit: 1 Visit OT Treatments $Self Care/Home Management : 8-22 mins $Therapeutic Activity: 8-22 mins  Dick Laine, OTA Acute Rehabilitation Services  Office [phone]   Jeb LITTIE Laine 01/25/2024, 1:05 PM

## 2024-01-25 NOTE — TOC Progression Note (Signed)
 Transition of Care (TOC) - Progression Note    Patient Details  Name: Anthony A Heatwole Jr. MRN: [id number] Date of Birth: 02/28/1956  Transition [facility]) CM/SW Contact  Rosaline JONELLE Joe, RN Phone Number: 01/25/2024, 1:45 PM  Clinical Narrative:    CM met with [name] Squibb, APS SW and asked that he assist with patient's placement needs.  I shared with Ufot, SW that I reached out to [facility], Group home owner to review the patient for potential bed offer and to follow up to see if patient would agree to admission.  APS is continuing to follow the patient with placement needs but have chosen not to seek guardianship for this patient.  DTP patient.   Expected Discharge Plan: Skilled Nursing Facility Barriers to Discharge: Continued Medical Work up  Expected Discharge Plan and Services In-house Referral: Clinical Social Work     Living arrangements for the past 2 months: Homeless                                       Social Determinants of Health (SDOH) Interventions SDOH Screenings   Food Insecurity: Patient Unable To Answer (12/09/2023)  Housing: Patient Unable To Answer (12/09/2023)  Transportation Needs: Patient Unable To Answer (12/09/2023)  Utilities: Patient Unable To Answer (12/09/2023)  Depression (PHQ2-9): Medium Risk (10/16/2020)  Social Connections: Unknown (12/22/2023)  Tobacco Use: High Risk (12/08/2023)    Readmission Risk Interventions    01/14/2024    9:48 AM 08/27/2023   12:21 PM 08/19/2023    9:17 AM  Readmission Risk Prevention Plan  Transportation Screening Complete Complete Complete  Medication Review Oceanographer) Complete Complete Complete  PCP or Specialist appointment within 3-5 days of discharge Complete Complete   HRI or Home Care Consult Complete Complete Complete  SW Recovery Care/Counseling Consult Complete Complete Complete  Palliative Care Screening Not Applicable Not Applicable Not Applicable  Skilled Nursing Facility Not  Applicable Not Applicable Not Applicable

## 2024-01-25 NOTE — Plan of Care (Signed)

## 2024-01-25 NOTE — Progress Notes (Signed)
 PROGRESS NOTE  Anthony A Aloisi Jr. FMW:[id number] DOB: 01-05-1956 DOA: 12/07/2023 PCP: Pcp, No   LOS: 48 days   Brief Narrative / Interim history: 68 y.o. male with PMH significant for homelessness, chronic alcoholism c/b withdrawals/DTs, chronic smoking, HTN, paroxysmal A-fib, h/o PE previously on Eliquis , hiatal hernia s/p Nissen fundoplication 2016, chronic low back pain, chronic anxiety, opiate withdrawal presented to the ED for confusion, low blood sugar. During this hospitalization, he has also been seen by critical care, CHF team, GI, neurology, psychiatry and palliative care. During this admission , had intermittent agitation- requiring Ativan  per CIWA protocol, transferred to PCCM  patient was placed on Precedex , Librium , clonazepam . Concluded treatment Patient was seen by GI, started on stress dose steroids.  Patient was also seen by advanced heart failure team for biventricular heart failure.  He was transferred to TRH service for further management of hypoglycemia, hyponatremia. Per psychiatry consultation, patient lacks decision-making capacity, APS involved for placement.  However this has now resolved.  APS denied guardianship.  Pt making decisions. Currently stable for DC, pending safe disposition.  Subjective / 24h Interval events: In bed, no complaints  Assesement and Plan: Principal Problem:   Hypoglycemia Active Problems:   Paroxysmal atrial fibrillation (HCC)   Hyponatremia   HTN (hypertension)   Macrocytic anemia   History of pulmonary embolus (PE)   Alcohol  withdrawal (HCC)   Alcohol  withdrawal delirium (HCC)   Moderate protein malnutrition (HCC)   Lactic acidosis   Acute delirium   Atrial fibrillation with RVR (HCC)   Principal problem SIRS with hypotension -  Resolved.  Completed empiric 5-day course of Zosyn .  Remains afebrile and stable   Active problems Acute metabolic encephalopathy secondary to alcohol  withdrawal - Previously in the ICU getting Precedex .   Vitamin supplementation.  Resolved, alert, appropriate   Hospital delirium -resolved, back to baseline   Retroperitoneal hematoma/chronic iron  deficiency anemia/acute blood loss anemia - S/p 5 units total RBCs during course of hospital stay.  Hemoglobin stable.  No active bleeding.  Continue Eliquis    A-fib with RVR -  Initially on Cardizem  drip.  Subsequently discontinued, transition to amiodarone  p.o. Eliquis .  Rate controlled, in the 90s this morning   HFrEF -  EF 30-40%.  GDMT limited by hypotension.   Hyponatremia -mild, overall stable, sodium normalized today   Hypokalemia/hypomagnesemia -stable when last checked.  Will repeat tomorrow morning   Goals of care - Patient is DNR.  Patient was homeless, TOC working on dispo discharge disposition.  APS involved however now stating they cannot pursue guardianship.  Per psychiatry 6/15 the patient lacks capacity for decision-making.  However, pt is now very conversive, oriented, able to make his own decisions.  Appears patient does not want to pursue ALF and stated he would walk out if placed there.  Is willing to go back to the community either aboard house or similar.  Working closely with TOC on disposition planning aligning with the patient's goals.   Scheduled Meds:  amiodarone   200 mg Oral BID   apixaban   5 mg Oral BID   ascorbic acid   500 mg Oral Daily   feeding supplement  237 mL Oral Q24H   Gerhardt's butt cream   Topical BID   Glycerin  (Adult)  1 suppository Rectal Once   lidocaine   1 patch Transdermal Q24H   multivitamin with minerals  1 tablet Oral Daily   nutrition supplement (JUVEN)  1 packet Oral BID BM   mouth rinse  15 mL Mouth Rinse  4 times per day   pantoprazole   40 mg Oral Daily   senna-docusate  1 tablet Oral QHS   sodium chloride  flush  10-40 mL Intracatheter Q12H   sodium chloride   1 g Oral TID WC   urea   15 g Oral BID   Continuous Infusions: PRN Meds:.acetaminophen , metoprolol  tartrate, naphazoline-glycerin ,  OLANZapine  zydis, ondansetron  (ZOFRAN ) IV, mouth rinse  Current Outpatient Medications  Medication Instructions   acetaminophen  (TYLENOL ) 500 mg, Oral, Every 8 hours PRN   Eliquis  5 mg, Oral, 2 times daily   FeroSul 325 mg, Oral, Daily with breakfast   folic acid  (FOLVITE ) 1 mg, Oral, Daily   magnesium  oxide (MAG-OX) 400 mg, Oral, 2 times daily   metoprolol  succinate (TOPROL -XL) 25 mg, Oral, Daily   mometasone -formoterol  (DULERA ) 200-5 MCG/ACT AERO 2 puffs, Inhalation, 2 times daily   OLANZapine  (ZYPREXA ) 2.5 mg, Oral, Daily after lunch   OLANZapine  (ZYPREXA ) 5 mg, Oral, Daily at bedtime   pantoprazole  (PROTONIX ) 40 mg, Oral, Daily   rosuvastatin  (CRESTOR ) 10 mg, Oral, Daily   thiamine  (VITAMIN B1) 100 mg, Oral, Daily    Diet Orders (From admission, onward)     Start     Ordered   01/06/24 1359  Diet regular Room service appropriate? Yes; Fluid consistency: Thin  Diet effective now       Question Answer Comment  Room service appropriate? Yes   Fluid consistency: Thin      01/06/24 1358            DVT prophylaxis:  apixaban  (ELIQUIS ) tablet 5 mg   Lab Results  Component Value Date   PLT 298 01/20/2024      Code Status: Limited: Do not attempt resuscitation (DNR) -DNR-LIMITED -Do Not Intubate/DNI   Family Communication: no family at bedside  Status is: Inpatient Remains inpatient appropriate because: severity of illness  Level of care: Med-Surg  Consultants:  none  Objective: Vitals:   01/24/24 1632 01/24/24 2015 01/25/24 0458 01/25/24 0823  BP: 104/69 111/83 (!) 118/91 118/88  Pulse: (!) 102 93 99 96  Resp: 19 18 18    Temp: 98.1 F (36.7 C) 98.1 F (36.7 C) (!) 97.5 F (36.4 C) 97.6 F (36.4 C)  TempSrc: Oral     SpO2: 98% 100% 99% 100%  Weight:   65.3 kg   Height:        Intake/Output Summary (Last 24 hours) at 01/25/2024 0951 Last data filed at 01/25/2024 0300 Gross per 24 hour  Intake --  Output 800 ml  Net -800 ml   Wt Readings from Last 3  Encounters:  01/25/24 65.3 kg  07/28/23 64.7 kg  07/12/23 67.1 kg    Examination:  Constitutional: NAD  Data Reviewed: I have independently reviewed following labs and imaging studies   CBC Recent Labs  Lab 01/20/24 0258  WBC 4.9  HGB 11.5*  HCT 35.1*  PLT 298  MCV 97.8  MCH 32.0  MCHC 32.8  RDW 18.7*    Recent Labs  Lab 01/20/24 0258  NA 135  K 3.6  CL 105  CO2 21*  GLUCOSE 79  BUN 36*  CREATININE 0.57*  CALCIUM  9.5  AST 36  ALT 32  ALKPHOS 91  BILITOT 0.4  ALBUMIN  3.0*  MG 1.4*    ------------------------------------------------------------------------------------------------------------------ No results for input(s): CHOL, HDL, LDLCALC, TRIG, CHOLHDL, LDLDIRECT in the last 72 hours.  Lab Results  Component Value Date   HGBA1C 5.2 02/06/2023   ------------------------------------------------------------------------------------------------------------------ No results for input(s): TSH, T4TOTAL,  T3FREE, THYROIDAB in the last 72 hours.  Invalid input(s): FREET3  Cardiac Enzymes No results for input(s): CKMB, TROPONINI, MYOGLOBIN in the last 168 hours.  Invalid input(s): CK ------------------------------------------------------------------------------------------------------------------    Component Value Date/Time   BNP 265.6 (H) 01/02/2024 0008    CBG: Recent Labs  Lab 01/24/24 0804 01/24/24 1157 01/24/24 1634 01/24/24 2017 01/25/24 0823  GLUCAP 93 113* 101* 117* 86    No results found for this or any previous visit (from the past 240 hours).   Radiology Studies: No results found.   Nilda Fendt, MD, PhD [facility]  Between 7 am - 7 pm I am available, please contact me via Amion (for emergencies) or Securechat (non urgent messages)  Between 7 pm - 7 am I am not available, please contact night coverage MD/APP via Amion

## 2024-01-26 DIAGNOSIS — E162 Hypoglycemia, unspecified: Secondary | ICD-10-CM | POA: Diagnosis not present

## 2024-01-26 LAB — COMPREHENSIVE METABOLIC PANEL WITH GFR
ALT: 43 U/L (ref 0–44)
AST: 52 U/L — ABNORMAL HIGH (ref 15–41)
Albumin: 3 g/dL — ABNORMAL LOW (ref 3.5–5.0)
Alkaline Phosphatase: 86 U/L (ref 38–126)
Anion gap: 7 (ref 5–15)
BUN: 40 mg/dL — ABNORMAL HIGH (ref 8–23)
CO2: 21 mmol/L — ABNORMAL LOW (ref 22–32)
Calcium: 8.8 mg/dL — ABNORMAL LOW (ref 8.9–10.3)
Chloride: 106 mmol/L (ref 98–111)
Creatinine, Ser: 0.77 mg/dL (ref 0.61–1.24)
GFR, Estimated: >60 mL/min (ref >60)
Glucose, Bld: 129 mg/dL — ABNORMAL HIGH (ref 70–99)
Potassium: 3.7 mmol/L (ref 3.5–5.1)
Sodium: 134 mmol/L — ABNORMAL LOW (ref 135–145)
Total Bilirubin: 0.5 mg/dL (ref 0.0–1.2)
Total Protein: 6.7 g/dL (ref 6.5–8.1)

## 2024-01-26 LAB — MAGNESIUM: Magnesium: 1.5 mg/dL — ABNORMAL LOW (ref 1.7–2.4)

## 2024-01-26 LAB — CBC
HCT: 35.1 % — ABNORMAL LOW (ref 39.0–52.0)
Hemoglobin: 11.4 g/dL — ABNORMAL LOW (ref 13.0–17.0)
MCH: 31.5 pg (ref 26.0–34.0)
MCHC: 32.5 g/dL (ref 30.0–36.0)
MCV: 97 fL (ref 80.0–100.0)
Platelets: 268 K/uL (ref 150–400)
RBC: 3.62 MIL/uL — ABNORMAL LOW (ref 4.22–5.81)
RDW: 18.4 % — ABNORMAL HIGH (ref 11.5–15.5)
WBC: 4.4 K/uL (ref 4.0–10.5)
nRBC: 0 % (ref 0.0–0.2)

## 2024-01-26 LAB — GLUCOSE, CAPILLARY
Glucose-Capillary: 93 mg/dL (ref 70–99)
Glucose-Capillary: 95 mg/dL (ref 70–99)
Glucose-Capillary: 118 mg/dL — ABNORMAL HIGH (ref 70–99)
Glucose-Capillary: 98 mg/dL (ref 70–99)

## 2024-01-26 MED ORDER — MAGNESIUM OXIDE -MG SUPPLEMENT 400 (240 MG) MG PO TABS
800.0000 mg | ORAL_TABLET | Freq: Every day | ORAL | Status: DC
  Administered 2024-01-27 – 2024-02-01 (×6): 800 mg via ORAL
  Filled 2024-01-26 (×6): qty 2

## 2024-01-26 MED ORDER — SENNOSIDES-DOCUSATE SODIUM 8.6-50 MG PO TABS
2.0000 | ORAL_TABLET | Freq: Every day | ORAL | Status: DC
  Administered 2024-01-26: 2 via ORAL
  Filled 2024-01-26: qty 2

## 2024-01-26 MED ORDER — DIPHENHYDRAMINE HCL 25 MG PO CAPS: 25.0000 mg | ORAL_CAPSULE | Freq: Once | ORAL | Status: DC | PRN

## 2024-01-26 MED ORDER — MAGNESIUM OXIDE -MG SUPPLEMENT 400 (240 MG) MG PO TABS: 400.0000 mg | ORAL_TABLET | Freq: Every day | ORAL | Status: DC

## 2024-01-26 MED ORDER — MAGNESIUM SULFATE 4 GM/100ML IV SOLN
4.0000 g | Freq: Once | INTRAVENOUS | Status: AC
  Administered 2024-01-26: 4 g via INTRAVENOUS
  Filled 2024-01-26: qty 100

## 2024-01-26 NOTE — TOC Progression Note (Signed)
 Transition of Care (TOC) - Progression Note    Patient Details  Name: Anthony A Kage Jr. MRN: [id number] Date of Birth: 1956/04/28  Transition of Care [facility] CM/SW Contact  Rosaline JONELLE Joe, RN Phone Number: 01/26/2024, 11:08 AM  Clinical Narrative:    CM called and left a detailed voicemail with Delayne Roux, owner of [facility] group homes to ask if the facility was open to come and speak with the patient about available bed offer or not.  CM will follow up.  Patient is homeless with needs for group home/ ALF placement.  I spoke with the patient and he is agreeable to group home admission if found by CM.  [name] Squibb, SW with APS continues to follow the patient as well for safe disposition.   Expected Discharge Plan: Skilled Nursing Facility Barriers to Discharge: Continued Medical Work up  Expected Discharge Plan and Services In-house Referral: Clinical Social Work     Living arrangements for the past 2 months: Homeless                                       Social Determinants of Health (SDOH) Interventions SDOH Screenings   Food Insecurity: Patient Unable To Answer (12/09/2023)  Housing: Patient Unable To Answer (12/09/2023)  Transportation Needs: Patient Unable To Answer (12/09/2023)  Utilities: Patient Unable To Answer (12/09/2023)  Depression (PHQ2-9): Medium Risk (10/16/2020)  Social Connections: Unknown (12/22/2023)  Tobacco Use: High Risk (12/08/2023)    Readmission Risk Interventions    01/14/2024    9:48 AM 08/27/2023   12:21 PM 08/19/2023    9:17 AM  Readmission Risk Prevention Plan  Transportation Screening Complete Complete Complete  Medication Review Oceanographer) Complete Complete Complete  PCP or Specialist appointment within 3-5 days of discharge Complete Complete   HRI or Home Care Consult Complete Complete Complete  SW Recovery Care/Counseling Consult Complete Complete Complete  Palliative Care Screening Not Applicable Not Applicable Not Applicable   Skilled Nursing Facility Not Applicable Not Applicable Not Applicable

## 2024-01-26 NOTE — Progress Notes (Signed)
 PROGRESS NOTE  Anthony A Caudle Jr. FMW:[id number] DOB: [birth year] DOA: 12/07/2023 PCP: Pcp, No   LOS: 49 days   Brief Narrative / Interim history: 68 y.o. male with PMH significant for homelessness, chronic alcoholism c/b withdrawals/DTs, chronic smoking, HTN, paroxysmal A-fib, h/o PE previously on Eliquis , hiatal hernia s/p Nissen fundoplication 2016, chronic low back pain, chronic anxiety, opiate withdrawal presented to the ED for confusion, low blood sugar. During this hospitalization, he has also been seen by critical care, CHF team, GI, neurology, psychiatry and palliative care. During this admission , had intermittent agitation- requiring Ativan  per CIWA protocol, transferred to PCCM  patient was placed on Precedex , Librium , clonazepam . Concluded treatment Patient was seen by GI, started on stress dose steroids.  Patient was also seen by advanced heart failure team for biventricular heart failure.  He was transferred to TRH service for further management of hypoglycemia, hyponatremia. Per psychiatry consultation, patient lacks decision-making capacity, APS involved for placement.  However this has now resolved.  APS denied guardianship.  Pt making decisions. Currently stable for DC, pending safe disposition.  Subjective / 24h Interval events: Denies any acute complaint.  Reports some back pain.  No nausea no vomiting.  Inability to sleep.  Reports constipation.  Assesement and Plan: SIRS with hypotension -  Resolved.  Completed empiric 5-day course of Zosyn .  Remains afebrile and stable   Acute metabolic encephalopathy secondary to alcohol  withdrawal - Previously in the ICU getting Precedex .  Vitamin supplementation.  Resolved, alert, appropriate   Hospital delirium -resolved, back to baseline   Retroperitoneal hematoma/chronic iron  deficiency anemia/acute blood loss anemia - S/p 5 units total RBCs during course of hospital stay.  Hemoglobin stable.  No active bleeding.  Continue Eliquis     A-fib with RVR -  Initially on Cardizem  drip.  Subsequently discontinued, transition to amiodarone  p.o. Eliquis .  Rate controlled, in the 90s this morning   HFrEF -  EF 30-40%.  GDMT limited by hypotension.   Hyponatremia -mild, overall stable, sodium normalized today   Hypokalemia/hypomagnesemia -stable when last checked.  Will repeat tomorrow morning   Goals of care - Patient is DNR.  Patient was homeless, TOC working on dispo discharge disposition.  APS involved however now stating they cannot pursue guardianship.  Per psychiatry 6/15 the patient lacks capacity for decision-making.  However, pt is now very conversive, oriented, able to make his own decisions.  Appears patient does not want to pursue ALF and stated he would walk out if placed there.  Is willing to go back to the community either aboard house or similar.  Working closely with TOC on disposition planning aligning with the patient's goals.   Scheduled Meds:  amiodarone   200 mg Oral BID   apixaban   5 mg Oral BID   ascorbic acid   500 mg Oral Daily   feeding supplement  237 mL Oral Q24H   Gerhardt's butt cream   Topical BID   lidocaine   1 patch Transdermal Q24H   [START ON 01/27/2024] magnesium  oxide  800 mg Oral Daily   multivitamin with minerals  1 tablet Oral Daily   nutrition supplement (JUVEN)  1 packet Oral BID BM   mouth rinse  15 mL Mouth Rinse 4 times per day   pantoprazole   40 mg Oral Daily   senna-docusate  2 tablet Oral QHS   sodium chloride  flush  10-40 mL Intracatheter Q12H   sodium chloride   1 g Oral TID WC   urea   15 g Oral BID  Continuous Infusions: PRN Meds:.acetaminophen , diphenhydrAMINE , metoprolol  tartrate, naphazoline-glycerin , OLANZapine  zydis, ondansetron  (ZOFRAN ) IV, mouth rinse  Current Outpatient Medications  Medication Instructions   acetaminophen  (TYLENOL ) 500 mg, Oral, Every 8 hours PRN   Eliquis  5 mg, Oral, 2 times daily   FeroSul 325 mg, Oral, Daily with breakfast   folic acid   (FOLVITE ) 1 mg, Oral, Daily   magnesium  oxide (MAG-OX) 400 mg, Oral, 2 times daily   metoprolol  succinate (TOPROL -XL) 25 mg, Oral, Daily   mometasone -formoterol  (DULERA ) 200-5 MCG/ACT AERO 2 puffs, Inhalation, 2 times daily   OLANZapine  (ZYPREXA ) 2.5 mg, Oral, Daily after lunch   OLANZapine  (ZYPREXA ) 5 mg, Oral, Daily at bedtime   pantoprazole  (PROTONIX ) 40 mg, Oral, Daily   rosuvastatin  (CRESTOR ) 10 mg, Oral, Daily   thiamine  (VITAMIN B1) 100 mg, Oral, Daily    Diet Orders (From admission, onward)     Start     Ordered   01/06/24 1359  Diet regular Room service appropriate? Yes; Fluid consistency: Thin  Diet effective now       Question Answer Comment  Room service appropriate? Yes   Fluid consistency: Thin      01/06/24 1358            DVT prophylaxis:  apixaban  (ELIQUIS ) tablet 5 mg   Lab Results  Component Value Date   PLT 268 01/26/2024      Code Status: Limited: Do not attempt resuscitation (DNR) -DNR-LIMITED -Do Not Intubate/DNI   Family Communication: no family at bedside  Status is: Inpatient Remains inpatient appropriate because: severity of illness  Level of care: Med-Surg  Consultants:  none  Objective: Vitals:   01/26/24 0500 01/26/24 0808 01/26/24 1235 01/26/24 1609  BP:  117/72 101/74 (!) 86/58  Pulse:  93 100   Resp:   16 17  Temp:   97.8 F (36.6 C) 98.3 F (36.8 C)  TempSrc:      SpO2:  97% 99% 98%  Weight: 65.1 kg     Height:        Intake/Output Summary (Last 24 hours) at 01/26/2024 1917 Last data filed at 01/26/2024 1600 Gross per 24 hour  Intake 1590 ml  Output 700 ml  Net 890 ml   Wt Readings from Last 3 Encounters:  01/26/24 65.1 kg  07/28/23 64.7 kg  07/12/23 67.1 kg    Examination:  Clear to auscultation. S1-S2 present. No edema.   Data Reviewed: I have independently reviewed following labs and imaging studies   CBC Recent Labs  Lab 01/20/24 0258 01/26/24 0247  WBC 4.9 4.4  HGB 11.5* 11.4*  HCT 35.1*  35.1*  PLT 298 268  MCV 97.8 97.0  MCH 32.0 31.5  MCHC 32.8 32.5  RDW 18.7* 18.4*    Recent Labs  Lab 01/20/24 0258 01/26/24 0247  NA 135 134*  K 3.6 3.7  CL 105 106  CO2 21* 21*  GLUCOSE 79 129*  BUN 36* 40*  CREATININE 0.57* 0.77  CALCIUM  9.5 8.8*  AST 36 52*  ALT 32 43  ALKPHOS 91 86  BILITOT 0.4 0.5  ALBUMIN  3.0* 3.0*  MG 1.4* 1.5*    ------------------------------------------------------------------------------------------------------------------ No results for input(s): CHOL, HDL, LDLCALC, TRIG, CHOLHDL, LDLDIRECT in the last 72 hours.  Lab Results  Component Value Date   HGBA1C 5.2 02/06/2023   ------------------------------------------------------------------------------------------------------------------ No results for input(s): TSH, T4TOTAL, T3FREE, THYROIDAB in the last 72 hours.  Invalid input(s): FREET3  Cardiac Enzymes No results for input(s): CKMB, TROPONINI, MYOGLOBIN in the last  168 hours.  Invalid input(s): CK ------------------------------------------------------------------------------------------------------------------    Component Value Date/Time   BNP 265.6 (H) 01/02/2024 0008    CBG: Recent Labs  Lab 01/25/24 1218 01/25/24 1706 01/26/24 0808 01/26/24 1234 01/26/24 1607  GLUCAP 102* 101* 93 95 118*    No results found for this or any previous visit (from the past 240 hours).   Radiology Studies: No results found. Author: Yetta Blanch, MD  [facility] 01/26/2024  7:18 PM Between 7PM-7AM, please contact night-coverage, check [email or web address] for on call.
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
## 2024-01-27 DIAGNOSIS — E162 Hypoglycemia, unspecified: Secondary | ICD-10-CM | POA: Diagnosis not present

## 2024-01-27 LAB — BASIC METABOLIC PANEL WITH GFR
Anion gap: 7 (ref 5–15)
BUN: 43 mg/dL — ABNORMAL HIGH (ref 8–23)
CO2: 20 mmol/L — ABNORMAL LOW (ref 22–32)
Calcium: 9 mg/dL (ref 8.9–10.3)
Chloride: 109 mmol/L (ref 98–111)
Creatinine, Ser: 0.68 mg/dL (ref 0.61–1.24)
GFR, Estimated: >60 mL/min (ref >60)
Glucose, Bld: 125 mg/dL — ABNORMAL HIGH (ref 70–99)
Potassium: 4.1 mmol/L (ref 3.5–5.1)
Sodium: 136 mmol/L (ref 135–145)

## 2024-01-27 LAB — GLUCOSE, CAPILLARY
Glucose-Capillary: 96 mg/dL (ref 70–99)
Glucose-Capillary: 97 mg/dL (ref 70–99)
Glucose-Capillary: 137 mg/dL — ABNORMAL HIGH (ref 70–99)
Glucose-Capillary: 133 mg/dL — ABNORMAL HIGH (ref 70–99)

## 2024-01-27 LAB — CBC
HCT: 37.8 % — ABNORMAL LOW (ref 39.0–52.0)
Hemoglobin: 12.2 g/dL — ABNORMAL LOW (ref 13.0–17.0)
MCH: 31.7 pg (ref 26.0–34.0)
MCHC: 32.3 g/dL (ref 30.0–36.0)
MCV: 98.2 fL (ref 80.0–100.0)
Platelets: 259 K/uL (ref 150–400)
RBC: 3.85 MIL/uL — ABNORMAL LOW (ref 4.22–5.81)
RDW: 18.6 % — ABNORMAL HIGH (ref 11.5–15.5)
WBC: 4.6 K/uL (ref 4.0–10.5)
nRBC: 0 % (ref 0.0–0.2)

## 2024-01-27 LAB — MAGNESIUM: Magnesium: 1.9 mg/dL (ref 1.7–2.4)

## 2024-01-27 MED ORDER — ONDANSETRON HCL 4 MG/2ML IJ SOLN
4.0000 mg | Freq: Four times a day (QID) | INTRAMUSCULAR | Status: DC | PRN
  Administered 2024-01-27: 4 mg via INTRAVENOUS
  Filled 2024-01-27: qty 2

## 2024-01-27 MED ORDER — SODIUM CHLORIDE 0.9 % IV BOLUS: 1000.0000 mL | Freq: Once | INTRAVENOUS | Status: DC

## 2024-01-27 MED ORDER — SENNOSIDES-DOCUSATE SODIUM 8.6-50 MG PO TABS
2.0000 | ORAL_TABLET | Freq: Two times a day (BID) | ORAL | Status: DC
  Administered 2024-01-28 – 2024-01-29 (×3): 2 via ORAL
  Filled 2024-01-27 (×6): qty 2

## 2024-01-27 MED ORDER — SODIUM CHLORIDE 0.9 % IV BOLUS
1000.0000 mL | INTRAVENOUS | Status: DC
  Administered 2024-01-27: 1000 mL via INTRAVENOUS

## 2024-01-27 MED ORDER — DIPHENHYDRAMINE HCL 50 MG/ML IJ SOLN
12.5000 mg | Freq: Once | INTRAMUSCULAR | Status: AC | PRN
Start: 2024-01-27 — End: 2024-01-27
  Administered 2024-01-27: 12.5 mg via INTRAVENOUS
  Filled 2024-01-27: qty 1

## 2024-01-27 NOTE — Progress Notes (Signed)
 Nutrition Follow-up  DOCUMENTATION CODES:   Non-severe (moderate) malnutrition in context of chronic illness  INTERVENTION:  -Continue Magic cup TID with meals -Continue to offer Ensure Plus High Protein po TID -Continue 1 packet Juven BID to support wound healing -Continue 500 mg Vitamin C  daily x 30 days and 220 mg Zinc  Sulfate daily x 15 days for wound healing -Continue MVI with minerals daily   NUTRITION DIAGNOSIS:   Moderate Malnutrition related to social / environmental circumstances (ETOH use, homeless) as evidenced by moderate fat depletion, moderate muscle depletion.  Still applicable  GOAL:   Patient will meet greater than or equal to 90% of their needs  Progressing  MONITOR:   Diet advancement, Labs, Weight trends, TF tolerance  REASON FOR ASSESSMENT:   Consult Assessment of nutrition requirement/status  ASSESSMENT:   68 yo male admitted with EtOH withdrawal with DTs, lactic acidosis, concern for possible sepsis-?biliary source. PMH includes EtOH abuse with recurrent admissions for DTs, tobacco use, hiatal hernia s/p nissen (2016), HTN. Pt is homeless  Spoke to pt at bedside. Pt denies n/v/c/d or chewing/swallowing difficulties at this time. Last BM 7/8. Pt's appetite has been good, though, he does eat slowly over time. Documented meal intake typically 75-100% of meals. Pt's weight has been stable over admission. Pt continues to receive Juven, Magic cups, Ensure to promote adequate kcal/pro intake and aid in wound healing. Placement continues to be the barrier for d/c. Pt will no other nutritional questions/concerns at this time, will continue to monitor, RDN available prn.   Labs Sodium 134 BG 129 BUN 40 Calcium  8.8 Mg 1.5 Albumin  3.0 AST 52 H/H 12.2/37.8  Medications Amiodarone  Vit C MVI w/ min Juven Pantoprazole  Senna Zinc  Mg   Diet Order:   Diet Order             Diet regular Room service appropriate? Yes; Fluid consistency: Thin  Diet  effective now                   EDUCATION NEEDS:   No education needs have been identified at this time  Skin:  Skin Assessment: Reviewed RN Assessment Skin Integrity Issues:: Stage III Stage II: N/A Stage III: L tibial Other: IAD to rectum, full thickness lt lower leg wound  Last BM:  7/8  Height:   Ht Readings from Last 1 Encounters:  12/22/23 5' 6 (1.676 m)    Weight:   Wt Readings from Last 1 Encounters:  01/27/24 66.6 kg    Ideal Body Weight:  70 kg  BMI:  Body mass index is 23.7 kg/m.  Estimated Nutritional Needs:   Kcal:  1900-2100  Protein:  95-110g  Fluid:  >/=1.9L    Sanii Kukla Daml-Budig, RDN, LDN Registered Dietitian Nutritionist RD Inpatient Contact Info in [place]

## 2024-01-27 NOTE — TOC Progression Note (Signed)
 Transition of Care (TOC) - Progression Note    Patient Details  Name: Anthony A Dawn Jr. MRN: [id number] Date of Birth: [birth year]  [facility]) CM/SW Contact  Rosaline JONELLE Joe, RN Phone Number: 01/27/2024, 1:55 PM  Clinical Narrative:    CM called and left a detailed voicemail with Delayne Roux, CM with [facility] to see if she is able to meet with the patient this week and evaluate patient for possible bed offer to her facility.  Patient is being followed by APS and needs group home placement for safe discharge plan.   Patient is unable to return to live homeless in the community due to patient poor cognitive function.  MD is aware.   Expected Discharge Plan: Skilled Nursing Facility Barriers to Discharge: Continued Medical Work up  Expected Discharge Plan and Services In-house Referral: Clinical Social Work     Living arrangements for the past 2 months: Homeless                                       Social Determinants of Health (SDOH) Interventions SDOH Screenings   Food Insecurity: Patient Unable To Answer (12/09/2023)  Housing: Patient Unable To Answer (12/09/2023)  Transportation Needs: Patient Unable To Answer (12/09/2023)  Utilities: Patient Unable To Answer (12/09/2023)  Depression (PHQ2-9): Medium Risk (10/16/2020)  Social Connections: Unknown (12/22/2023)  Tobacco Use: High Risk (12/08/2023)    Readmission Risk Interventions    01/14/2024    9:48 AM 08/27/2023   12:21 PM 08/19/2023    9:17 AM  Readmission Risk Prevention Plan  Transportation Screening Complete Complete Complete  Medication Review Oceanographer) Complete Complete Complete  PCP or Specialist appointment within 3-5 days of discharge Complete Complete   HRI or Home Care Consult Complete Complete Complete  SW Recovery Care/Counseling Consult Complete Complete Complete  Palliative Care Screening Not Applicable Not Applicable Not Applicable  Skilled Nursing Facility Not  Applicable Not Applicable Not Applicable

## 2024-01-27 NOTE — Progress Notes (Signed)
 [facility] Progress Note Patient: Anthony A Febus Jr. FMW:[id number] DOB: 02-Mar-1956 DOA: 12/07/2023  DOS: the patient was seen and examined on 01/27/2024  Brief Hospital Course: 68 y.o. male with PMH significant for homelessness, chronic alcoholism c/b withdrawals/DTs, chronic smoking, HTN, paroxysmal A-fib, h/o PE previously on Eliquis , hiatal hernia s/p Nissen fundoplication 2016, chronic low back pain, chronic anxiety, opiate withdrawal presented to the ED for confusion, low blood sugar.  From hospital stay.  Admitted on 12/07/2023. Seen by critical care, Cardiology, GI, neurology, psychiatry and palliative care.    Assesement and Plan: SIRS  Resolved.  Completed empiric 5-day course of Zosyn .  Remains afebrile and stable  Orthostatic hypotension. Blood pressure drops down to 70s systolic on standing on 7/10. Will provide fluid bolus and recheck. We will be cognizant about his history of low EF.   Acute metabolic encephalopathy secondary to alcohol  withdrawal  Intermittent issues.  Currently resolved. Requiring ICU stay with Precedex .  Monitor.   Retroperitoneal hematoma/chronic iron  deficiency anemia/acute blood loss anemia  S/p 5 units total RBCs during course of hospital stay. Hemoglobin stable.  No active bleeding.  Continue Eliquis    Paroxysmal A-fib with RVR - Initially on Cardizem  drip. Currently on amiodarone  and Eliquis .   Acute on chronic HFrEF  EF in the past have been around 45%. EF this admission is 35 to 45%. Cardiology was consulted earlier. GDMT is limited by hypotension. Appears to be euvolemic for now.   Hyponatremia  mild, overall stable.  Will attempt to simplify the regimen.   Hypokalemia/hypomagnesemia  Monitor intermittently.   Goals of care - Patient is DNR.  Palliative care was consulted.  Disposition. Patient was homeless, TOC working on dispo discharge disposition. Per psychiatry 6/15 the patient lacks capacity for decision-making.  However, pt is now very conversive, oriented, able to make his own decisions.   APS involved however now stating they cannot pursue guardianship.  Appears patient does not want to pursue ALF and stated he would walk out if placed there. Is willing to go back to the community either boarding house or similar. Working closely with TOC on disposition planning aligning with the patient's goals.   Moderate protein calorie malnutrition. Continue supplementation.   Subjective: No nausea no vomiting no fever no chills.  While working with physical therapy blood pressure dropped.  Patient denies any symptoms.  Physical Exam: General: in Mild distress, No Rash Cardiovascular: S1 and S2 Present, No Murmur Respiratory: Good respiratory effort, Bilateral Air entry present. No Crackles, No wheezes Abdomen: Bowel Sound present, No tenderness Extremities: No edema Neuro: Alert and oriented x3, no new focal deficit  Data Reviewed: I have Reviewed nursing notes, Vitals, and Lab results. Since last encounter, pertinent lab results CBC and BMP   . I have ordered test including CBC and BMP  .   Disposition: Status is: Inpatient Remains inpatient appropriate because: Monitor for improvement in orthostasis  apixaban  (ELIQUIS ) tablet 5 mg   Family Communication: No one at bedside Level of care: Med-Surg   Vitals:   01/27/24 0823 01/27/24 0947 01/27/24 1205 01/27/24 1547  BP: 115/89 91/66 106/80 105/88  Pulse: 98 91 91 (!) 106  Resp: 18  18 18   Temp: (!) 97.4 F (36.3 C)  97.9 F (36.6 C) 97.6 F (36.4 C)  TempSrc: Oral  Oral Oral  SpO2: 99%  100% 100%  Weight:      Height:         Author: Yetta Blanch, MD 01/27/2024 6:50 PM  Please look on [email or web address] to find out who is on call.

## 2024-01-27 NOTE — Progress Notes (Signed)
 Physical Therapy Treatment Patient Details Name: Anthony A Marcel Jr. MRN: [id number] DOB: 05/05/1956 Today's Date: 01/27/2024   History of Present Illness Pt is a 68 y.o. male who presented 12/06/23 with SOB. Shortly after arriving, pt became agitated and was given IV Ativan . CT head is unremarkable. Patient admitted for EtOH withdrawal, acute delirium, and hypoglycemia. Cortrak placed 5/23 now discontinued.  The day before the patient had presented to ED requesting EtOH detox; patient was deemed to be not actively in EtOH withdrawal and was discharged, however upon discharge patient threatened staff and was escorted off of the premises by [employer]. PMH includes alcohol  abuse, HTN, CVA, HFmrEF, afib, PE, HLD, anxiety, depression, chronic low back pain.    PT Comments  Pt supine on entry, with coming to EoB reports dizziness BP 93/65. With standing BP 83/57, pt stands for 3 min and BP 73/46. Pt states he is going to bathroom. PT provides education on low BP and recommends urinal use. Pt adamant on going to bathroom to urinate, despite low BP. PT provides CGA and pt returns to bed, refuses further BP measurements, and is upset when PT puts bed alarm on. RN notified.     If plan is discharge home, recommend the following: Assistance with cooking/housework;Direct supervision/assist for medications management;Direct supervision/assist for financial management;Assist for transportation;Help with stairs or ramp for entrance;Supervision due to cognitive status;A Viscardi help with walking and/or transfers;A [place] help with bathing/dressing/bathroom   Can travel by private vehicle     Yes  Equipment Recommendations  BSC/3in1;Rolling walker (2 wheels)       Precautions / Restrictions Precautions Precautions: Fall Recall of Precautions/Restrictions: Impaired Precaution/Restrictions Comments: watch BP Restrictions Weight Bearing Restrictions Per Provider Order: No     Mobility  Bed Mobility Overal bed  mobility: Modified Independent Bed Mobility: Supine to Sit, Sit to Supine     Supine to sit: Modified independent (Device/Increase time), HOB elevated, Used rails Sit to supine: Modified independent (Device/Increase time), Used rails, HOB elevated   General bed mobility comments: Mod I using bed rails    Transfers Overall transfer level: Needs assistance Equipment used: Rolling walker (2 wheels), None Transfers: Sit to/from Stand Sit to Stand: Supervision           General transfer comment: supervsion for coming to sitting reports dizziness with sitting    Ambulation/Gait Ambulation/Gait assistance: Contact guard assist Gait Distance (Feet): 15 Feet Assistive device: Rolling walker (2 wheels) Gait Pattern/deviations: Step-through pattern, Trunk flexed, Decreased stride length Gait velocity: reduced Gait velocity interpretation: <1.31 ft/sec, indicative of household ambulator   General Gait Details: despite dizziness pt adamant about ambulation to bathroom, reluctantly agrees to RW use. returned to supine in bed after urination         Balance Overall balance assessment: Needs assistance Sitting-balance support: No upper extremity supported, Feet supported Sitting balance-Leahy Scale: Good Sitting balance - Comments: EOB   Standing balance support: No upper extremity supported, Bilateral upper extremity supported, During functional activity Standing balance-Leahy Scale: Fair Standing balance comment: able to stand statically without UE support, reliant on RW to ambulate                            Communication Communication Communication: Impaired Factors Affecting Communication: Hearing impaired (mildly)  Cognition Arousal: Alert Behavior During Therapy: WFL for tasks assessed/performed   PT - Cognitive impairments: Safety/Judgement, Awareness, Memory  PT - Cognition Comments: STM deficits noted with pt repeating  comments and trailing off in thought at times. Pt acknowledged having memory deficits. Decreased insight into safety as pt stated he was going to sit himself down on the ground when notified to turn around before he gets too tired due to lack of chairs to use in hall. Following commands: Impaired Following commands impaired: Follows multi-step commands with increased time    Cueing Cueing Techniques: Verbal cues     General Comments General comments (skin integrity, edema, etc.): Pt with orthostatic hypotension of 73/46 after 3 min of standing, (see full orthostatic BP measurements in Vitals Signs Flowsheet) despite education and encouragement of PT to use urinal, pt ambulates to bathroom. pt refuses against advisement pt ambulates to bathroom to urinate and returns to supine in bed, refuses further BP measurement and is upset about PT putting bed alarm on      Pertinent Vitals/Pain Pain Assessment Pain Assessment: Faces Faces Pain Scale: Hurts Grounds more Pain Location: back (chronic) Pain Descriptors / Indicators: Discomfort, Sore Pain Intervention(s): Limited activity within patient's tolerance, Monitored during session, Repositioned     PT Goals (current goals can now be found in the care plan section) Acute Rehab PT Goals Patient Stated Goal: get out to community and not have to come back PT Goal Formulation: With patient Time For Goal Achievement: 01/31/24 Potential to Achieve Goals: Good Progress towards PT goals: Not progressing toward goals - comment    Frequency    Min 1X/week       AM-PAC PT 6 Clicks Mobility   Outcome Measure  Help needed turning from your back to your side while in a flat bed without using bedrails?: None Help needed moving from lying on your back to sitting on the side of a flat bed without using bedrails?: None Help needed moving to and from a bed to a chair (including a wheelchair)?: A Bello Help needed standing up from a chair using your  arms (e.g., wheelchair or bedside chair)?: A Klar Help needed to walk in hospital room?: A Flenniken Help needed climbing 3-5 steps with a railing? : A [place] 6 Click Score: 20    End of Session Equipment Utilized During Treatment: Gait belt Activity Tolerance: Patient tolerated treatment well Patient left: in bed;with call bell/phone within reach;with bed alarm set Nurse Communication: Mobility status PT Visit Diagnosis: Other abnormalities of gait and mobility (R26.89);Other symptoms and signs involving the nervous system (R29.898);Unsteadiness on feet (R26.81)     Time: 9095-9081 PT Time Calculation (min) (ACUTE ONLY): 14 min  Charges:    $Therapeutic Activity: 8-22 mins PT General Charges $$ ACUTE PT VISIT: 1 Visit                     Shinika Estelle B. Fleeta Lapidus PT, DPT Acute Rehabilitation Services Please use secure chat or  Call Office [phone]    Almarie KATHEE Fleeta [facility] 01/27/2024, 10:36 AM

## 2024-01-27 NOTE — Plan of Care (Signed)

## 2024-01-27 NOTE — Progress Notes (Addendum)
 [facility] night cross cover note:   I was notified by the patient's RN that the patient is complaining of some nausea.  He would prefer to feel less nauseous before taking his evening medications. Will receive dose of prn iv zofran  at this time.   Additionally, patient reports some mild pruritus and also requests a sleep aid.  I subsequently ordered Benadryl  12.5 mg IV x 1 dose prn for pruritus.     Eva Pore, DO Hospitalist

## 2024-01-28 DIAGNOSIS — E162 Hypoglycemia, unspecified: Secondary | ICD-10-CM | POA: Diagnosis not present

## 2024-01-28 LAB — BASIC METABOLIC PANEL WITH GFR
Anion gap: 8 (ref 5–15)
BUN: 31 mg/dL — ABNORMAL HIGH (ref 8–23)
CO2: 22 mmol/L (ref 22–32)
Calcium: 9.3 mg/dL (ref 8.9–10.3)
Chloride: 109 mmol/L (ref 98–111)
Creatinine, Ser: 0.67 mg/dL (ref 0.61–1.24)
GFR, Estimated: >60 mL/min (ref >60)
Glucose, Bld: 93 mg/dL (ref 70–99)
Potassium: 3.8 mmol/L (ref 3.5–5.1)
Sodium: 139 mmol/L (ref 135–145)

## 2024-01-28 LAB — MAGNESIUM: Magnesium: 1.7 mg/dL (ref 1.7–2.4)

## 2024-01-28 MED ORDER — ENSURE PLUS HIGH PROTEIN PO LIQD
237.0000 mL | Freq: Two times a day (BID) | ORAL | Status: DC
  Administered 2024-01-28 – 2024-02-28 (×46): 237 mL via ORAL
  Filled 2024-01-28: qty 237

## 2024-01-28 MED ORDER — POTASSIUM CHLORIDE CRYS ER 20 MEQ PO TBCR
40.0000 meq | EXTENDED_RELEASE_TABLET | Freq: Once | ORAL | Status: AC
  Administered 2024-01-28: 40 meq via ORAL
  Filled 2024-01-28: qty 2

## 2024-01-28 MED ORDER — SODIUM CHLORIDE 0.9 % IV SOLN: INTRAVENOUS | Status: DC

## 2024-01-28 MED ORDER — SODIUM CHLORIDE 0.9 % IV BOLUS
1000.0000 mL | Freq: Once | INTRAVENOUS | Status: AC
  Administered 2024-01-28: 1000 mL via INTRAVENOUS

## 2024-01-28 MED ORDER — HYDROXYZINE HCL 25 MG PO TABS
25.0000 mg | ORAL_TABLET | Freq: Three times a day (TID) | ORAL | Status: DC | PRN
  Administered 2024-01-28 – 2024-02-28 (×55): 25 mg via ORAL
  Filled 2024-01-28 (×55): qty 1

## 2024-01-28 MED ORDER — MIDODRINE HCL 5 MG PO TABS
2.5000 mg | ORAL_TABLET | Freq: Three times a day (TID) | ORAL | Status: DC
  Administered 2024-01-28 – 2024-01-29 (×3): 2.5 mg via ORAL
  Filled 2024-01-28 (×3): qty 1

## 2024-01-28 NOTE — Progress Notes (Signed)
 Patient refusing his night oral meds, c/o nausea/vomiting. Provider was notified.

## 2024-01-28 NOTE — Progress Notes (Signed)
 [facility] Progress Note Patient: Anthony [name]. FMW:[id number] DOB: 1955-10-13 DOA: 12/07/2023  DOS: the patient was seen and examined on 01/28/2024  Brief Hospital Course: 68 y.o. male with PMH significant for homelessness, chronic alcoholism c/b withdrawals/DTs, chronic smoking, HTN, paroxysmal A-fib, h/o PE previously on Eliquis , hiatal hernia s/p Nissen fundoplication 2016, chronic low back pain, chronic anxiety, opiate withdrawal presented to the ED for confusion, low blood sugar.  From hospital stay.  Admitted on 12/07/2023. Seen by critical care, Cardiology, GI, neurology, psychiatry and palliative care.  Assesement and Plan: SIRS  Resolved.  Completed empiric 5-day course of Zosyn .  Remains afebrile and stable  Orthostatic hypotension. Blood pressure continues to drop down starting 7/10. No significant improvement IV fluid bolus. Will initiate midodrine .  Not a good candidate for initiating Florinef  given low EF.   Acute metabolic encephalopathy secondary to alcohol  withdrawal  Intermittent issues.  Currently resolved. Requiring ICU stay with Precedex .  Monitor.   Retroperitoneal hematoma/chronic iron  deficiency anemia/acute blood loss anemia  S/p 5 units total RBCs during course of hospital stay. Hemoglobin stable.  No active bleeding.  Continue Eliquis    Paroxysmal A-fib with RVR - Initially on Cardizem  drip.  Has intermittent tachycardia.  May need initiation of Lopressor .  Would avoid Cardizem  secondary to low EF. Currently on amiodarone  and Eliquis .  Acute on chronic HFrEF  EF in the past have been around 45%. EF this admission is 35 to 45%. Cardiology was consulted earlier. GDMT is limited by hypotension. Appears to be euvolemic for now.   Hyponatremia  mild, overall stable.  Will attempt to simplify the regimen.  Hypokalemia/hypomagnesemia  Monitor intermittently.   Goals of care - Patient is DNR.  Palliative care was consulted.  Disposition. Patient  was homeless, TOC working on dispo discharge disposition. Per psychiatry 6/15 the patient lacks capacity for decision-making. However, pt is now very conversive, oriented, able to make his own decisions.   APS involved however now stating they cannot pursue guardianship.  Appears patient does not want to pursue ALF and stated he would walk out if placed there. Is willing to go back to the community either boarding house or similar. Working closely with TOC on disposition planning aligning with the patient's goals.   Moderate protein calorie malnutrition. Continue supplementation.   Subjective: No nausea no vomiting no fever no chills.  Denies any acute complaint.  Reported some dizziness while he was getting his orthostatic.  Physical Exam: General: in Mild distress, No Rash Cardiovascular: S1 and S2 Present, No Murmur Respiratory: Good respiratory effort, Bilateral Air entry present. No Crackles, No wheezes Abdomen: Bowel Sound present, No tenderness Extremities: No edema Neuro: Alert and oriented x3, no new focal deficit  Data Reviewed: I have Reviewed nursing notes, Vitals, and Lab results. Since last encounter, pertinent lab results CBC and BMP   . I have ordered test including BMP and magnesium   .   Disposition: Status is: Inpatient Remains inpatient appropriate because: Monitor for improvement in blood pressure. apixaban  (ELIQUIS ) tablet 5 mg   Family Communication: No one at bedside Level of care: Med-Surg   Vitals:   01/28/24 0500 01/28/24 0830 01/28/24 1201 01/28/24 1441  BP:  (!) 127/91 90/68 95/72   Pulse:  (!) 113 (!) 104 91  Resp:      Temp:  98 F (36.7 C) 97.9 F (36.6 C) 98.4 F (36.9 C)  TempSrc:    Oral  SpO2:  99% 98% 99%  Weight: 65.9 kg  Height:         Author: Yetta Blanch, MD 01/28/2024 6:20 PM  Please look on [email or web address] to find out who is on call.

## 2024-01-28 NOTE — Plan of Care (Signed)
  Problem: Education: Goal: Knowledge of General Education information will improve Description: Including pain rating scale, medication(s)/side effects and non-pharmacologic comfort measures 01/28/2024 0124 by Evern Monica HERO, RN Outcome: Progressing 01/27/2024 2254 by Evern Monica HERO, RN Outcome: Progressing   Problem: Health Behavior/Discharge Planning: Goal: Ability to manage health-related needs will improve 01/28/2024 0124 by Evern Monica HERO, RN Outcome: Progressing 01/27/2024 2254 by Evern Monica HERO, RN Outcome: Progressing   Problem: Clinical Measurements: Goal: Ability to maintain clinical measurements within normal limits will improve 01/28/2024 0124 by Evern Monica HERO, RN Outcome: Progressing 01/27/2024 2254 by Evern Monica HERO, RN Outcome: Progressing Goal: Will remain free from infection 01/28/2024 0124 by Evern Monica HERO, RN Outcome: Progressing 01/27/2024 2254 by Evern Monica HERO, RN Outcome: Progressing Goal: Diagnostic test results will improve 01/28/2024 0124 by Evern Monica HERO, RN Outcome: Progressing 01/27/2024 2254 by Evern Monica HERO, RN Outcome: Progressing Goal: Respiratory complications will improve 01/28/2024 0124 by Evern Monica HERO, RN Outcome: Progressing 01/27/2024 2254 by Evern Monica HERO, RN Outcome: Progressing Goal: Cardiovascular complication will be avoided 01/28/2024 0124 by Evern Monica HERO, RN Outcome: Progressing 01/27/2024 2254 by Evern Monica HERO, RN Outcome: Progressing   Problem: Activity: Goal: Risk for activity intolerance will decrease 01/28/2024 0124 by Evern Monica HERO, RN Outcome: Progressing 01/27/2024 2254 by Evern Monica HERO, RN Outcome: Progressing   Problem: Nutrition: Goal: Adequate nutrition will be maintained 01/28/2024 0124 by Evern Monica HERO, RN Outcome: Progressing 01/27/2024 2254 by Evern Monica HERO, RN Outcome: Progressing   Problem: Coping: Goal: Level of anxiety will decrease 01/28/2024  0124 by Evern Monica HERO, RN Outcome: Progressing 01/27/2024 2254 by Evern Monica HERO, RN Outcome: Progressing   Problem: Elimination: Goal: Will not experience complications related to bowel motility 01/28/2024 0124 by Evern Monica HERO, RN Outcome: Progressing 01/27/2024 2254 by Evern Monica HERO, RN Outcome: Progressing Goal: Will not experience complications related to urinary retention 01/28/2024 0124 by Evern Monica HERO, RN Outcome: Progressing 01/27/2024 2254 by Evern Monica HERO, RN Outcome: Progressing   Problem: Pain Managment: Goal: General experience of comfort will improve and/or be controlled 01/28/2024 0124 by Evern Monica HERO, RN Outcome: Progressing 01/27/2024 2254 by Evern Monica HERO, RN Outcome: Progressing   Problem: Safety: Goal: Ability to remain free from injury will improve 01/28/2024 0124 by Evern Monica HERO, RN Outcome: Progressing 01/27/2024 2254 by Evern Monica HERO, RN Outcome: Progressing   Problem: Skin Integrity: Goal: Risk for impaired skin integrity will decrease 01/28/2024 0124 by Evern Monica HERO, RN Outcome: Progressing 01/27/2024 2254 by Evern Monica HERO, RN Outcome: Progressing

## 2024-01-29 DIAGNOSIS — E162 Hypoglycemia, unspecified: Secondary | ICD-10-CM | POA: Diagnosis not present

## 2024-01-29 LAB — BASIC METABOLIC PANEL WITH GFR
Anion gap: 6 (ref 5–15)
BUN: 36 mg/dL — ABNORMAL HIGH (ref 8–23)
CO2: 23 mmol/L (ref 22–32)
Calcium: 9.3 mg/dL (ref 8.9–10.3)
Chloride: 108 mmol/L (ref 98–111)
Creatinine, Ser: 0.64 mg/dL (ref 0.61–1.24)
GFR, Estimated: >60 mL/min (ref >60)
Glucose, Bld: 96 mg/dL (ref 70–99)
Potassium: 4.1 mmol/L (ref 3.5–5.1)
Sodium: 137 mmol/L (ref 135–145)

## 2024-01-29 LAB — MAGNESIUM: Magnesium: 1.6 mg/dL — ABNORMAL LOW (ref 1.7–2.4)

## 2024-01-29 LAB — GLUCOSE, CAPILLARY: Glucose-Capillary: 102 mg/dL — ABNORMAL HIGH (ref 70–99)

## 2024-01-29 MED ORDER — MIDODRINE HCL 5 MG PO TABS
5.0000 mg | ORAL_TABLET | Freq: Three times a day (TID) | ORAL | Status: DC
  Administered 2024-01-30 – 2024-02-03 (×13): 5 mg via ORAL
  Filled 2024-01-29 (×13): qty 1

## 2024-01-29 MED ORDER — MAGNESIUM SULFATE 4 GM/100ML IV SOLN
4.0000 g | Freq: Once | INTRAVENOUS | Status: AC
  Administered 2024-01-29: 4 g via INTRAVENOUS
  Filled 2024-01-29: qty 100

## 2024-01-29 NOTE — Plan of Care (Signed)

## 2024-01-29 NOTE — Progress Notes (Signed)
 [facility] Progress Note Patient: Anthony [name]. FMW:[id number] DOB: 08-Mar-1956 DOA: 12/07/2023  DOS: the patient was seen and examined on 01/29/2024  Brief Hospital Course: 68 y.o. male with PMH significant for homelessness, chronic alcoholism c/b withdrawals/DTs, chronic smoking, HTN, paroxysmal A-fib, h/o PE previously on Eliquis , hiatal hernia s/p Nissen fundoplication 2016, chronic low back pain, chronic anxiety, opiate withdrawal presented to the ED for confusion, low blood sugar.  From hospital stay.  Admitted on 12/07/2023. Seen by critical care, Cardiology, GI, neurology, psychiatry and palliative care.  Assesement and Plan: SIRS  Resolved.  Completed empiric 5-day course of Zosyn .  Remains afebrile and stable  Orthostatic hypotension. Blood pressure continues to drop down starting 7/10. No significant improvement IV fluid bolus. Continue midodrine .  Dose adjusted.   Acute metabolic encephalopathy secondary to alcohol  withdrawal  Intermittent issues.  Currently resolved. Requiring ICU stay with Precedex .  Monitor.   Retroperitoneal hematoma/chronic iron  deficiency anemia/acute blood loss anemia  S/p 5 units total RBCs during course of hospital stay. Hemoglobin stable.  No active bleeding.  Continue Eliquis    Paroxysmal A-fib with RVR - Initially on Cardizem  drip.  Has intermittent tachycardia.  May need initiation of Lopressor .  Would avoid Cardizem  secondary to low EF. Currently on amiodarone  and Eliquis .  Acute on chronic HFrEF  EF in the past have been around 45%. EF this admission is 35 to 45%. Cardiology was consulted earlier. GDMT is limited by hypotension. Appears to be euvolemic for now.   Hyponatremia  mild, overall stable.  Will attempt to simplify the regimen.  Hypokalemia/hypomagnesemia  Providing IV and oral magnesium  replacement. Maintain K more than 4.   Goals of care - Patient is DNR.  Palliative care was consulted.  Disposition. Patient  was homeless, TOC working on dispo discharge disposition. Per psychiatry 6/15 the patient lacks capacity for decision-making. However, pt is now very conversive, oriented, able to make his own decisions.   APS involved however now stating they cannot pursue guardianship.  Appears patient does not want to pursue ALF and stated he would walk out if placed there. Is willing to go back to the community either boarding house or similar. Working closely with TOC on disposition planning aligning with the patient's goals.   Moderate protein calorie malnutrition. Continue supplementation.   Subjective: Denies any acute complaint.  No nausea no vomiting or fever no chills.  Physical Exam: Clear to auscultation. S1-S2 present. no edema Alert and awake.  No focal deficit.  Data Reviewed: I have Reviewed nursing notes, Vitals, and Lab results. Since last encounter, pertinent lab results BMP and magnesium    .  BMP and magnesium  reordered.  Disposition: Status is: Inpatient Remains inpatient appropriate because: Monitor for improvement in blood pressure. apixaban  (ELIQUIS ) tablet 5 mg   Family Communication: No one at bedside Level of care: Med-Surg   Vitals:   01/29/24 0513 01/29/24 0738 01/29/24 1137 01/29/24 1615  BP: 119/87 (!) 125/93 95/68 94/67   Pulse: 98 94 97 84  Resp: 20 18 18 16   Temp: (!) 97.4 F (36.3 C) 97.9 F (36.6 C) 98.3 F (36.8 C) 97.8 F (36.6 C)  TempSrc:      SpO2: 98% 98% 97% 98%  Weight:      Height:         Author: Yetta Blanch, MD 01/29/2024 6:57 PM  Please look on [email or web address] to find out who is on call.

## 2024-01-30 DIAGNOSIS — E162 Hypoglycemia, unspecified: Secondary | ICD-10-CM | POA: Diagnosis not present

## 2024-01-30 LAB — MAGNESIUM: Magnesium: 1.6 mg/dL — ABNORMAL LOW (ref 1.7–2.4)

## 2024-01-30 LAB — BASIC METABOLIC PANEL WITH GFR
Anion gap: 7 (ref 5–15)
BUN: 28 mg/dL — ABNORMAL HIGH (ref 8–23)
CO2: 23 mmol/L (ref 22–32)
Calcium: 9.3 mg/dL (ref 8.9–10.3)
Chloride: 106 mmol/L (ref 98–111)
Creatinine, Ser: 0.62 mg/dL (ref 0.61–1.24)
GFR, Estimated: >60 mL/min (ref >60)
Glucose, Bld: 95 mg/dL (ref 70–99)
Potassium: 4 mmol/L (ref 3.5–5.1)
Sodium: 136 mmol/L (ref 135–145)

## 2024-01-30 MED ORDER — SENNOSIDES-DOCUSATE SODIUM 8.6-50 MG PO TABS: 1.0000 | ORAL_TABLET | Freq: Every evening | ORAL | Status: DC | PRN

## 2024-01-30 MED ORDER — LACTATED RINGERS IV BOLUS
250.0000 mL | Freq: Once | INTRAVENOUS | Status: AC
  Administered 2024-01-30: 250 mL via INTRAVENOUS

## 2024-01-30 MED ORDER — FAMOTIDINE 20 MG PO TABS
20.0000 mg | ORAL_TABLET | Freq: Two times a day (BID) | ORAL | Status: DC
  Administered 2024-01-30 – 2024-02-28 (×60): 20 mg via ORAL
  Filled 2024-01-30 (×59): qty 1

## 2024-01-30 MED ORDER — FLUDROCORTISONE ACETATE 0.1 MG PO TABS: 0.1000 mg | ORAL_TABLET | Freq: Every day | ORAL | Status: DC

## 2024-01-30 MED ORDER — MAGNESIUM SULFATE 2 GM/50ML IV SOLN
2.0000 g | Freq: Once | INTRAVENOUS | Status: AC
  Administered 2024-01-30: 2 g via INTRAVENOUS
  Filled 2024-01-30: qty 50

## 2024-01-30 NOTE — Progress Notes (Signed)
 [facility] Progress Note Patient: Anthony A Michna Jr. FMW:[id number] DOB: [birth year] DOA: 12/07/2023  DOS: the patient was seen and examined on 01/30/2024  Brief Hospital Course: 68 y.o. male with PMH significant for homelessness, chronic alcoholism c/b withdrawals/DTs, chronic smoking, HTN, paroxysmal A-fib, h/o PE previously on Eliquis , hiatal hernia s/p Nissen fundoplication 2016, chronic low back pain, chronic anxiety, opiate withdrawal presented to the ED for confusion, low blood sugar.  From hospital stay.  Admitted on 12/07/2023. Seen by critical care, Cardiology, GI, neurology, psychiatry and palliative care.  Assesement and Plan: SIRS  Resolved.  Completed empiric 5-day course of Zosyn .  Remains afebrile and stable  Orthostatic hypotension. Blood pressure continues to drop down starting 7/10. No significant improvement IV fluid bolus. Continue midodrine .  Not a good candidate for Florinef .  Add abdominal binder and compression stockings.   Acute metabolic encephalopathy secondary to alcohol  withdrawal  Intermittent issues.  Currently resolved. Requiring ICU stay with Precedex .  Monitor.   Retroperitoneal hematoma/chronic iron  deficiency anemia/acute blood loss anemia  S/p 5 units total RBCs during course of hospital stay. Hemoglobin stable.  No active bleeding.  Continue Eliquis    Paroxysmal A-fib with RVR - Initially on Cardizem  drip.  Has intermittent tachycardia.  May need initiation of Lopressor .  Would avoid Cardizem  secondary to low EF. Currently on amiodarone  and Eliquis .  Acute on chronic HFrEF  EF in the past have been around 45%. EF this admission is 35 to 45%. Cardiology was consulted earlier. GDMT is limited by hypotension. Appears to be euvolemic for now.   Hyponatremia  mild, overall stable.  Currently on urea  packet only.  Hypokalemia/hypomagnesemia  Providing IV and oral magnesium  replacement. Maintain K more than 4.   Goals of care - Patient is  DNR.  Palliative care was consulted.  Disposition. Patient was homeless, TOC working on dispo discharge disposition. Per psychiatry 6/15 the patient lacks capacity for decision-making. However, pt is now very conversive, oriented, able to make his own decisions.   APS involved however now stating they cannot pursue guardianship.  Appears patient does not want to pursue ALF and stated he would walk out if placed there. Is willing to go back to the community either boarding house or similar. Working closely with TOC on disposition planning aligning with the patient's goals.   Moderate protein calorie malnutrition. Continue supplementation.  Reported diarrhea. Constipated. Patient reports that he has multiple diarrhea although RN is not able to corroborate with me about that history.  Diarrhea will explain hypokalemia and hypomagnesemia though.  For now monitor. Will discontinue bowel regimen.   Subjective: No nausea no vomiting or no fever no chills no reports diarrhea.  No abdominal pain.  Physical Exam: General: in Mild distress, No Rash Cardiovascular: S1 and S2 Present, No Murmur Respiratory: Good respiratory effort, Bilateral Air entry present. No Crackles, No wheezes Abdomen: Bowel Sound present, No tenderness Extremities: No edema Neuro: Alert and oriented x3, no new focal deficit  Data Reviewed: I have Reviewed nursing notes, Vitals, and Lab results. Since last encounter, pertinent lab results BMP magnesium    . I have ordered test including BMP and magnesium   .   Disposition: Status is: Inpatient Remains inpatient appropriate because: Monitor for improvement in blood pressure  Place and maintain sequential compression device Start: 01/30/24 1209 Place TED hose Start: 01/30/24 1209 apixaban  (ELIQUIS ) tablet 5 mg   Family Communication: No one at bedside Level of care: Med-Surg   Vitals:   01/30/24 0400 01/30/24 0407 01/30/24 0756  01/30/24 1125  BP:  120/84 (!) 120/92  101/71  Pulse:  84 95 86  Resp:  18 16 18   Temp:  97.7 F (36.5 C) (!) 97.5 F (36.4 C) 97.6 F (36.4 C)  TempSrc:      SpO2:  100% 97% 98%  Weight: 68.4 kg     Height:         Author: Yetta Blanch, MD 01/30/2024 7:25 PM  Please look on [email or web address] to find out who is on call.

## 2024-01-30 NOTE — Progress Notes (Signed)
 [facility] night cross cover note:   I was notified by the patient's RN of positive orthostatic vital signs this evening.  I subsequently ordered a small 250 cc LR bolus, along with order for repeat orthostatic vital signs to be checked in the morning.    Eva Pore, DO Hospitalist

## 2024-01-30 NOTE — Plan of Care (Signed)

## 2024-01-30 NOTE — Progress Notes (Signed)
 BP was taken after LR bolus was administered, = 108/77. Provider was notified. Will continue to monitor.

## 2024-01-31 DIAGNOSIS — E162 Hypoglycemia, unspecified: Secondary | ICD-10-CM | POA: Diagnosis not present

## 2024-01-31 LAB — BASIC METABOLIC PANEL WITH GFR
Anion gap: 8 (ref 5–15)
BUN: 25 mg/dL — ABNORMAL HIGH (ref 8–23)
CO2: 23 mmol/L (ref 22–32)
Calcium: 9.3 mg/dL (ref 8.9–10.3)
Chloride: 105 mmol/L (ref 98–111)
Creatinine, Ser: 0.71 mg/dL (ref 0.61–1.24)
GFR, Estimated: >60 mL/min (ref >60)
Glucose, Bld: 92 mg/dL (ref 70–99)
Potassium: 4 mmol/L (ref 3.5–5.1)
Sodium: 136 mmol/L (ref 135–145)

## 2024-01-31 LAB — MAGNESIUM: Magnesium: 1.7 mg/dL (ref 1.7–2.4)

## 2024-01-31 MED ORDER — COSYNTROPIN 0.25 MG IJ SOLR
0.2500 mg | Freq: Once | INTRAMUSCULAR | Status: AC
  Administered 2024-02-01: 0.25 mg via INTRAVENOUS
  Filled 2024-01-31: qty 0.25

## 2024-01-31 NOTE — Progress Notes (Signed)
 [facility] Progress Note Patient: Anthony [name]. FMW:[id number] DOB: 1956/03/25 DOA: 12/07/2023  DOS: the patient was seen and examined on 01/31/2024  Brief Hospital Course: 68 y.o. male with PMH significant for homelessness, chronic alcoholism c/b withdrawals/DTs, chronic smoking, HTN, paroxysmal A-fib, h/o PE previously on Eliquis , hiatal hernia s/p Nissen fundoplication 2016, chronic low back pain, chronic anxiety, opiate withdrawal presented to the ED for confusion, low blood sugar.  From hospital stay.  Admitted on 12/07/2023. Seen by critical care, Cardiology, GI, neurology, psychiatry and palliative care.  Assesement and Plan: SIRS  Resolved.  Completed empiric 5-day course of Zosyn .  Remains afebrile and stable  Orthostatic hypotension. Blood pressure continues to drop down starting 7/10. No significant improvement IV fluid bolus. Continue midodrine .  Not a good candidate for Florinef .  Add abdominal binder and compression stockings.  Able to ambulate in the hallway without any dizziness or lightheadedness.   Acute metabolic encephalopathy secondary to alcohol  withdrawal  Intermittent issues.  Currently resolved. Requiring ICU stay with Precedex .  Monitor.   Retroperitoneal hematoma/chronic iron  deficiency anemia/acute blood loss anemia  S/p 5 units total RBCs during course of hospital stay. Hemoglobin stable.  No active bleeding.  Continue Eliquis    Paroxysmal A-fib with RVR - Initially on Cardizem  drip.  Has intermittent tachycardia.  May need initiation of Lopressor .  Would avoid Cardizem  secondary to low EF. Currently on amiodarone  and Eliquis .  Acute on chronic HFrEF  EF in the past have been around 45%. EF this admission is 35 to 45%. Cardiology was consulted earlier. GDMT is limited by hypotension. Appears to be euvolemic for now.   Hyponatremia  mild, overall stable.  Currently on urea  packet only.  Hypokalemia/hypomagnesemia  Providing IV and oral  magnesium  replacement. Maintain K more than 4.   Goals of care - Patient is DNR.  Palliative care was consulted.  Disposition. Patient was homeless, TOC working on dispo discharge disposition. Per psychiatry 6/15 the patient lacks capacity for decision-making. However, pt is now very conversive, oriented, able to make his own decisions.   APS involved however now stating they cannot pursue guardianship.  Appears patient does not want to pursue ALF and stated he would walk out if placed there. Is willing to go back to the community either boarding house or similar. Working closely with TOC on disposition planning aligning with the patient's goals.   Moderate protein calorie malnutrition. Continue supplementation.  Reported diarrhea. Constipated. Patient reports that he has multiple diarrhea although RN is not able to corroborate with me about that history.  Diarrhea will explain hypokalemia and hypomagnesemia though.  For now monitor. Will discontinue bowel regimen.   Subjective: Denies any acute complaint.  No dizziness on ambulation.  Physical Exam: General: in Mild distress, No Rash Cardiovascular: S1 and S2 Present, No Murmur Respiratory: Good respiratory effort, Bilateral Air entry present. No Crackles, No wheezes Abdomen: Bowel Sound present, No tenderness Extremities: No edema Neuro: Alert and oriented x3, no new focal deficit  Data Reviewed: I have Reviewed nursing notes, Vitals, and Lab results. Since last encounter, pertinent lab results CBC and BMP   . I have ordered test including ACTH  stimulation test  .   Disposition: Status is: Inpatient Remains inpatient appropriate because: Should be medically stable tomorrow pending ACTH  stimulation test.  Place and maintain sequential compression device Start: 01/30/24 1209 Place TED hose Start: 01/30/24 1209 apixaban  (ELIQUIS ) tablet 5 mg   Family Communication: No one at bedside Level of care: Med-Surg  Vitals:  01/31/24 0820 01/31/24 0822 01/31/24 1138 01/31/24 1652  BP: (!) 71/61 108/72 118/74 (!) 120/95  Pulse: (!) 122 92 (!) 102 (!) 105  Resp: 16  18 19   Temp:   97.9 F (36.6 C) 97.9 F (36.6 C)  TempSrc:      SpO2: 97% 100% 99% 100%  Weight:      Height:         Author: Yetta Blanch, MD 01/31/2024 6:56 PM  Please look on [email or web address] to find out who is on call.

## 2024-01-31 NOTE — TOC Progression Note (Signed)
 Transition of Care (TOC) - Progression Note    Patient Details  Name: Anthony A Seltzer Jr. MRN: [id number] Date of Birth: 07-01-1956  Transition of Care [facility]) CM/SW Contact  Rosaline JONELLE Joe, RN Phone Number: 01/31/2024, 10:52 AM  Clinical Narrative:    CM called and spoke with Delayne Roux, Owner of [facility] group home and she is male bed availability and plans to come and see the patient in the next 1-2 for interview/ assessment for possible bed offer.  Clinicals were sent via secure email to Roux, Owner for review.  Patient is aware of pending interview this week for placement.   Expected Discharge Plan: Skilled Nursing Facility Barriers to Discharge: Continued Medical Work up  Expected Discharge Plan and Services In-house Referral: Clinical Social Work     Living arrangements for the past 2 months: Homeless                                       Social Determinants of Health (SDOH) Interventions SDOH Screenings   Food Insecurity: Patient Unable To Answer (12/09/2023)  Housing: Patient Unable To Answer (12/09/2023)  Transportation Needs: Patient Unable To Answer (12/09/2023)  Utilities: Patient Unable To Answer (12/09/2023)  Depression (PHQ2-9): Medium Risk (10/16/2020)  Social Connections: Unknown (12/22/2023)  Tobacco Use: High Risk (12/08/2023)    Readmission Risk Interventions    01/14/2024    9:48 AM 08/27/2023   12:21 PM 08/19/2023    9:17 AM  Readmission Risk Prevention Plan  Transportation Screening Complete Complete Complete  Medication Review Oceanographer) Complete Complete Complete  PCP or Specialist appointment within 3-5 days of discharge Complete Complete   HRI or Home Care Consult Complete Complete Complete  SW Recovery Care/Counseling Consult Complete Complete Complete  Palliative Care Screening Not Applicable Not Applicable Not Applicable  Skilled Nursing Facility Not Applicable Not Applicable Not Applicable

## 2024-02-01 DIAGNOSIS — E162 Hypoglycemia, unspecified: Secondary | ICD-10-CM | POA: Diagnosis not present

## 2024-02-01 LAB — ACTH STIMULATION, 3 TIME POINTS
Cortisol, 30 Min: 22.1 ug/dL
Cortisol, 60 Min: 15.8 ug/dL
Cortisol, Base: 10 ug/dL

## 2024-02-01 LAB — BASIC METABOLIC PANEL WITH GFR
Anion gap: 11 (ref 5–15)
BUN: 31 mg/dL — ABNORMAL HIGH (ref 8–23)
CO2: 23 mmol/L (ref 22–32)
Calcium: 9.5 mg/dL (ref 8.9–10.3)
Chloride: 102 mmol/L (ref 98–111)
Creatinine, Ser: 0.66 mg/dL (ref 0.61–1.24)
GFR, Estimated: >60 mL/min (ref >60)
Glucose, Bld: 83 mg/dL (ref 70–99)
Potassium: 4.1 mmol/L (ref 3.5–5.1)
Sodium: 136 mmol/L (ref 135–145)

## 2024-02-01 LAB — MAGNESIUM: Magnesium: 1.6 mg/dL — ABNORMAL LOW (ref 1.7–2.4)

## 2024-02-01 MED ORDER — MAGNESIUM SULFATE 4 GM/100ML IV SOLN
4.0000 g | Freq: Once | INTRAVENOUS | Status: AC
  Administered 2024-02-01: 4 g via INTRAVENOUS
  Filled 2024-02-01: qty 100

## 2024-02-01 MED ORDER — MAGNESIUM CHLORIDE 64 MG PO TBEC
2.0000 | DELAYED_RELEASE_TABLET | Freq: Every day | ORAL | Status: DC
  Administered 2024-02-01 – 2024-02-28 (×29): 128 mg via ORAL
  Filled 2024-02-01 (×28): qty 2

## 2024-02-01 NOTE — Plan of Care (Signed)

## 2024-02-01 NOTE — Progress Notes (Signed)
 Physical Therapy Treatment Patient Details Name: Anthony A Gibby Jr. MRN: [id number] DOB: 1956/06/28 Today's Date: 02/01/2024   History of Present Illness Pt is a 68 y.o. male who presented 12/06/23 with SOB. Shortly after arriving, pt became agitated and was given IV Ativan . CT head is unremarkable. Patient admitted for EtOH withdrawal, acute delirium, and hypoglycemia. Cortrak placed 5/23 now discontinued.  The day before the patient had presented to ED requesting EtOH detox; patient was deemed to be not actively in EtOH withdrawal and was discharged, however upon discharge patient threatened staff and was escorted off of the premises by [employer]. PMH includes alcohol  abuse, HTN, CVA, HFmrEF, afib, PE, HLD, anxiety, depression, chronic low back pain.    PT Comments  Pt is mod I for bed mobility and transfers and supervision for ambulation in hallway without AD. Pt is likely better than his baseline level of mobility. Pt dismisses suggestions from PT for improving gait and balance. Pt has no DME needs at this time as he refuses RW use. Mobility Specialist agreeable to stop by and ask pt to walk. PT signing off.     If plan is discharge home, recommend the following: Assistance with cooking/housework;Direct supervision/assist for medications management;Direct supervision/assist for financial management;Assist for transportation;Help with stairs or ramp for entrance;Supervision due to cognitive status;A [place] help with walking and/or transfers;A [place] help with bathing/dressing/bathroom   Can travel by private vehicle     Yes  Equipment Recommendations    None      Precautions / Restrictions Precautions Precautions: Fall Recall of Precautions/Restrictions: Impaired Precaution/Restrictions Comments: watch BP Restrictions Weight Bearing Restrictions Per Provider Order: No     Mobility  Bed Mobility Overal bed mobility: Modified Independent Bed Mobility: Supine to Sit, Sit to Supine     Supine  to sit: Modified independent (Device/Increase time), HOB elevated, Used rails Sit to supine: Modified independent (Device/Increase time), Used rails, HOB elevated   General bed mobility comments: Mod I using bed rails    Transfers Overall transfer level: Needs assistance Equipment used: None Transfers: Sit to/from Stand Sit to Stand: Modified independent (Device/Increase time)           General transfer comment: good power up increased time and effort to steady    Ambulation/Gait Ambulation/Gait assistance: Supervision Gait Distance (Feet): 600 Feet Assistive device: None Gait Pattern/deviations: Step-through pattern, Trunk flexed, Decreased stride length Gait velocity: supervision for safety, occasional scissoring step but able to self steady without outside support, cues for upright posture and scapular retraction grossly ignored Gait velocity interpretation: 1.31 - 2.62 ft/sec, indicative of limited community ambulator   General Gait Details: ambulates around      Balance Overall balance assessment: Needs assistance Sitting-balance support: No upper extremity supported, Feet supported Sitting balance-Leahy Scale: Good Sitting balance - Comments: EOB   Standing balance support: No upper extremity supported, Bilateral upper extremity supported, During functional activity Standing balance-Leahy Scale: Fair                              Hotel manager: Impaired Factors Affecting Communication: Hearing impaired (mildly)  Cognition Arousal: Alert Behavior During Therapy: WFL for tasks assessed/performed   PT - Cognitive impairments: Safety/Judgement, Awareness, Memory                       PT - Cognition Comments: continues to have decreased short term memory deficits. He prefers to take the same  path everytime in hallway Following commands: Impaired Following commands impaired: Follows multi-step commands with increased  time    Cueing Cueing Techniques: Verbal cues     General Comments General comments (skin integrity, edema, etc.): Pt with no complaints of dizziness, VSS on RA      Pertinent Vitals/Pain Pain Assessment Pain Assessment: Faces Faces Pain Scale: Hurts [place] more Pain Location: back (chronic) Pain Descriptors / Indicators: Discomfort, Sore Pain Intervention(s): Limited activity within patient's tolerance, Monitored during session, Repositioned     PT Goals (current goals can now be found in the care plan section) Acute Rehab PT Goals Patient Stated Goal: get out to community and not have to come back PT Goal Formulation: With patient Time For Goal Achievement: 01/31/24 Potential to Achieve Goals: Good Progress towards PT goals: Progressing toward goals    Frequency    Min 1X/week       AM-PAC PT 6 Clicks Mobility   Outcome Measure  Help needed turning from your back to your side while in a flat bed without using bedrails?: None Help needed moving from lying on your back to sitting on the side of a flat bed without using bedrails?: None Help needed moving to and from a bed to a chair (including a wheelchair)?: None Help needed standing up from a chair using your arms (e.g., wheelchair or bedside chair)?: None Help needed to walk in hospital room?: None Help needed climbing 3-5 steps with a railing? : A [place] 6 Click Score: 23    End of Session   Activity Tolerance: Patient tolerated treatment well Patient left: in bed;with call bell/phone within reach Nurse Communication: Mobility status PT Visit Diagnosis: Other abnormalities of gait and mobility (R26.89);Other symptoms and signs involving the nervous system (R29.898);Unsteadiness on feet (R26.81)     Time: 8568-8553 PT Time Calculation (min) (ACUTE ONLY): 15 min  Charges:    $Gait Training: 8-22 mins PT General Charges $$ ACUTE PT VISIT: 1 Visit                     Brighton Pilley B. Fleeta Lapidus PT,  DPT Acute Rehabilitation Services Please use secure chat or  Call Office [phone]    Almarie KATHEE Fleeta [facility] 02/01/2024, 4:11 PM

## 2024-02-01 NOTE — Progress Notes (Signed)
 [facility] Progress Note Patient: Anthony [name]. FMW:[id number] DOB: January 29, 1956 DOA: 12/07/2023  DOS: the patient was seen and examined on 02/01/2024  Brief Hospital Course: 68 y.o. male with PMH significant for homelessness, chronic alcoholism c/b withdrawals/DTs, chronic smoking, HTN, paroxysmal A-fib, h/o PE previously on Eliquis , hiatal hernia s/p Nissen fundoplication 2016, chronic low back pain, chronic anxiety, opiate withdrawal presented to the ED for confusion, low blood sugar.  From hospital stay.  Admitted on 12/07/2023. Seen by critical care, Cardiology, GI, neurology, psychiatry and palliative care.  Assesement and Plan: SIRS  Resolved.  Completed empiric 5-day course of Zosyn .  Remains afebrile and stable  Orthostatic hypotension. Blood pressure dropped significantly on standing up. Received IV fluid bolus.  Initiated on midodrine . Due to CHF, not a good candidate for Florinef . Able to ambulate after initiation of midodrine . Continue with abdominal binder and compression stockings as well. Cosyntropin  stim test negative.    Acute metabolic encephalopathy secondary to alcohol  withdrawal  Currently resolved. Requiring ICU stay with Precedex .  Monitor.   Retroperitoneal hematoma/chronic iron  deficiency anemia/acute blood loss anemia  S/p 5 units total RBCs during course of hospital stay. Hemoglobin stable.  No active bleeding.  Continue Eliquis    Paroxysmal A-fib with RVR - Initially on Cardizem  drip.  Has intermittent tachycardia.   Would avoid Cardizem  secondary to low EF. Currently on amiodarone  and Eliquis .  Acute on chronic HFrEF  EF in the past have been around 45%. EF this admission is 35 to 45%. Cardiology was consulted earlier. GDMT is limited by hypotension. Appears to be euvolemic for now.   Hyponatremia  mild, overall stable.  Currently on urea  packet only.  Hypokalemia/hypomagnesemia  Providing IV and oral magnesium  replacement. Maintain K  more than 4.   Goals of care - Patient is DNR.  Palliative care was consulted.  Disposition. Patient was homeless, TOC working on dispo discharge disposition. Per psychiatry 6/15 the patient lacks capacity for decision-making. However, pt is now very conversive, oriented, able to make his own decisions.   APS involved however now stating they cannot pursue guardianship.  Appears patient does not want to pursue ALF and stated he would walk out if placed there. Is willing to go back to the community either boarding house or similar. Working closely with TOC on disposition planning aligning with the patient's goals.   Moderate protein calorie malnutrition. Continue supplementation.  Reported diarrhea. Constipated. Patient reports that he has multiple diarrhea although RN is not able to corroborate with me about that history.  Diarrhea will explain hypokalemia and hypomagnesemia though.  For now monitor. Will discontinue bowel regimen.   Subjective: No nausea no vomiting no fever no chills no chest pain.  Physical Exam: Clear to auscultation. No edema.  Data Reviewed: I have Reviewed nursing notes, Vitals, and Lab results. Since last encounter, pertinent lab results BMP   . I have ordered test including CBC and BMP  .   Disposition: Status is: Inpatient Remains inpatient appropriate because: Monitor for placement.  Medically stable now.  Place and maintain sequential compression device Start: 01/30/24 1209 Place TED hose Start: 01/30/24 1209 apixaban  (ELIQUIS ) tablet 5 mg   Family Communication: No one at bedside Level of care: Med-Surg   Vitals:   02/01/24 0045 02/01/24 0500 02/01/24 1223 02/01/24 1629  BP: (!) 123/93 (!) 118/94 108/81 115/88  Pulse: 90 (!) 102 (!) 104 (!) 107  Resp: 18 18    Temp: (!) 97.4 F (36.3 C) 98.1 F (36.7 C)  97.9 F (36.6 C) 98 F (36.7 C)  TempSrc: Oral Oral    SpO2: 97% 98% 97% 100%  Weight:      Height:         Author: Yetta Blanch,  MD 02/01/2024 6:30 PM  Please look on [email or web address] to find out who is on call.

## 2024-02-02 DIAGNOSIS — E162 Hypoglycemia, unspecified: Secondary | ICD-10-CM | POA: Diagnosis not present

## 2024-02-02 LAB — CBC
HCT: 38.5 % — ABNORMAL LOW (ref 39.0–52.0)
Hemoglobin: 12.7 g/dL — ABNORMAL LOW (ref 13.0–17.0)
MCH: 32.2 pg (ref 26.0–34.0)
MCHC: 33 g/dL (ref 30.0–36.0)
MCV: 97.5 fL (ref 80.0–100.0)
Platelets: 227 K/uL (ref 150–400)
RBC: 3.95 MIL/uL — ABNORMAL LOW (ref 4.22–5.81)
RDW: 18 % — ABNORMAL HIGH (ref 11.5–15.5)
WBC: 4.2 K/uL (ref 4.0–10.5)
nRBC: 0 % (ref 0.0–0.2)

## 2024-02-02 LAB — MAGNESIUM: Magnesium: 2.2 mg/dL (ref 1.7–2.4)

## 2024-02-02 LAB — BASIC METABOLIC PANEL WITH GFR
Anion gap: 12 (ref 5–15)
BUN: 49 mg/dL — ABNORMAL HIGH (ref 8–23)
CO2: 22 mmol/L (ref 22–32)
Calcium: 9.5 mg/dL (ref 8.9–10.3)
Chloride: 104 mmol/L (ref 98–111)
Creatinine, Ser: 0.85 mg/dL (ref 0.61–1.24)
GFR, Estimated: >60 mL/min (ref >60)
Glucose, Bld: 128 mg/dL — ABNORMAL HIGH (ref 70–99)
Potassium: 3.7 mmol/L (ref 3.5–5.1)
Sodium: 138 mmol/L (ref 135–145)

## 2024-02-02 NOTE — Progress Notes (Signed)
 Speech Language Pathology Treatment: Cognitive-Linguistic  Patient Details Name: Anthony A Klemmer Jr. MRN: [id number] DOB: 1955-12-25 Today's Date: 02/02/2024 Time: 8875-8849 SLP Time Calculation (min) (ACUTE ONLY): 26 min  Assessment / Plan / Recommendation Clinical Impression  Pt seen for follow up SLP session to address cognitive goals. He is received ambulating and tiding up things around his room. He later started reading the newspaper and reported he has been reading the paper daily. Throughout session, pt is logical and completely coherent. He does report some baseline memory deficits, which he attributed to prior concussions from sports or falling and hitting head. He has trouble recalling names of less familiar people. He does occasionally lose his train of thought in conversation, though is easily redirected. We discussed any specific cognitive goals and pt denied current needs. He reported feeling at cognitive baseline at this time.   All cognitive goals met at this time. No further SLP intervention indicated. SLP will sign off.    HPI HPI: Pt is a 68 y.o. male who presented 12/06/23 with SOB. Shortly after arriving, pt became agitated and was given IV Ativan . CT head is unremarkable. Patient admitted for EtOH withdrawal, acute delirium, and hypoglycemia. Cortrak placed 5/23.  The day before the patient had presented to ED requesting EtOH detox; patient was deemed to be not actively in EtOH withdrawal and was discharged, however upon discharge patient threatened staff and was escorted off of the premises by [employer]. PMH includes alcohol  abuse, HTN, CVA, HFmrEF, afib, PE, HLD, anxiety, depression, chronic low back pain.      SLP Plan  Discharge SLP treatment due to (comment);All goals met          Recommendations                      None       Discharge SLP treatment due to (comment);All goals met     Anthony [name]  02/02/2024, 12:23 PM

## 2024-02-02 NOTE — Plan of Care (Signed)
  Problem: SLP Cognition Goals Goal: Patient will utilize external memory aids Description: Patient will utilize external memory aids to facilitate recall of information for improved safety with Outcome: Completed/Met Flowsheets (Taken 02/02/2024 1220) Patient will utilize external memory aids to facilitate recall of ____ information for improved safety with ______: min assist Goal: Patient will demonstrate problem solving skills Description: Patient will demonstrate problem solving skills during functional ADL's with Outcome: Completed/Met Flowsheets (Taken 02/02/2024 1220) Patient will demonstrate _____ problem solving skills during functional ADL's with ______: min assist

## 2024-02-02 NOTE — Progress Notes (Signed)
 Occupational Therapy Treatment Patient Details Name: Anthony A Monts Jr. MRN: [id number] DOB: 11-01-1955 Today's Date: 02/02/2024   History of present illness Pt is a 68 y.o. male who presented 12/06/23 with SOB. Shortly after arriving, pt became agitated and was given IV Ativan . CT head is unremarkable. Patient admitted for EtOH withdrawal, acute delirium, and hypoglycemia. Cortrak placed 5/23 now discontinued.  The day before the patient had presented to ED requesting EtOH detox; patient was deemed to be not actively in EtOH withdrawal and was discharged, however upon discharge patient threatened staff and was escorted off of the premises by [employer]. PMH includes alcohol  abuse, HTN, CVA, HFmrEF, afib, PE, HLD, anxiety, depression, chronic low back pain.   OT comments  Pt progressing well during OT sessions, his cognition is overall functional but there still seems to be some cognitive challenges with working memory. Pt also believes that the year is 1985. He did score perfect on a SBT assessment and only made one error during his pillbox test. Still recommend pt have someone check behind his medications for safety. OT to continue following pt to further challenge his cognitive function. Updated goals as needed, pt currently ambulating hallway ind/mod I and ind with his ADLs.       If plan is discharge home, recommend the following:  Assist for transportation;Direct supervision/assist for medications management;A [place] help with walking and/or transfers;A [place]gu[place] help with bathing/dressing/bathroom;Supervision due to cognitive status   Equipment Recommendations  Other (comment) (defer)    Recommendations for Other Services      Precautions / Restrictions Precautions Precautions: Fall Recall of Precautions/Restrictions: Impaired Precaution/Restrictions Comments: watch BP Restrictions Weight Bearing Restrictions Per Provider Order: No       Mobility Bed Mobility               General bed  mobility comments: Pt OOB on arrival, ambulating hallway    Transfers Overall transfer level: Modified independent   Transfers: Sit to/from Stand Sit to Stand: Modified independent (Device/Increase time)                 Balance Overall balance assessment: Mild deficits observed, not formally tested                                         ADL either performed or assessed with clinical judgement   ADL                                       Functional mobility during ADLs: Modified independent General ADL Comments: focused on cognitive reassessment    Extremity/Trunk Assessment Upper Extremity Assessment Upper Extremity Assessment: Overall WFL for tasks assessed            Vision       Perception     Praxis     Communication Communication Communication: Impaired Factors Affecting Communication: Hearing impaired (mildly)   Cognition Arousal: Alert Behavior During Therapy: WFL for tasks assessed/performed Cognition: Cognition impaired   Orientation impairments: Time (Pt stated 1985) Awareness: Online awareness intact, Intellectual awareness intact Memory impairment (select all impairments): Working memory     OT - Cognition Comments: Pt reports baseline troubles with memory, impaired ability to recall names and forgetful of tasks at baseline. Pt scored perfectly on the SBT, performed pillbox assessment and pt had  one error during the question 1 pill every other day. Seems he misinterpreted the question as every day                 Following commands: Impaired Following commands impaired: Follows multi-step commands with increased time      Cueing   Cueing Techniques: Verbal cues  Exercises      Shoulder Instructions       General Comments      Pertinent Vitals/ Pain       Pain Assessment Pain Assessment: 0-10 Pain Score: 6  Pain Location: back (chronic) Pain Descriptors / Indicators: Discomfort,  Sore Pain Intervention(s): Monitored during session, Other (comment) (lidocaine  patch)  Home Living                                          Prior Functioning/Environment              Frequency  Min 2X/week        Progress Toward Goals  OT Goals(current goals can now be found in the care plan section)  Progress towards OT goals: Progressing toward goals  Acute Rehab OT Goals Patient Stated Goal: to Discharge from hospital OT Goal Formulation: With patient Time For Goal Achievement: 02/16/24 Potential to Achieve Goals: Good ADL Goals Additional ADL Goal #2: Pt will complete a multistep ADL/iADL independently  Plan      Co-evaluation                 AM-PAC OT 6 Clicks Daily Activity     Outcome Measure   Help from another person eating meals?: None Help from another person taking care of personal grooming?: None Help from another person toileting, which includes using toliet, bedpan, or urinal?: A Manzano Help from another person bathing (including washing, rinsing, drying)?: A Dimmer Help from another person to put on and taking off regular upper body clothing?: A Mysliwiec Help from another person to put on and taking off regular lower body clothing?: A Sharpless 6 Click Score: 20    End of Session    OT Visit Diagnosis: Other symptoms and signs involving cognitive function;Unsteadiness on feet (R26.81);Low vision, both eyes (H54.2)   Activity Tolerance Patient tolerated treatment well   Patient Left in chair;with call bell/phone within reach   Nurse Communication Mobility status        Time: 8368-8346 OT Time Calculation (min): 22 min  Charges: OT General Charges $OT Visit: 1 Visit OT Treatments $Therapeutic Activity: 8-22 mins  02/02/2024  AB, OTR/L  Acute Rehabilitation Services  Office: [phone]   [name] 02/02/2024, 6:22 PM

## 2024-02-02 NOTE — Plan of Care (Signed)
  Problem: Education: Goal: Knowledge of General Education information will improve Description: Including pain rating scale, medication(s)/side effects and non-pharmacologic comfort measures 02/02/2024 0511 by Taft Sari POUR, RN Outcome: Progressing 02/02/2024 0511 by Taft Sari POUR, RN Outcome: Progressing   Problem: Health Behavior/Discharge Planning: Goal: Ability to manage health-related needs will improve 02/02/2024 0511 by Taft Sari POUR, RN Outcome: Progressing 02/02/2024 0511 by Taft Sari POUR, RN Outcome: Progressing   Problem: Clinical Measurements: Goal: Ability to maintain clinical measurements within normal limits will improve 02/02/2024 0511 by Taft Sari POUR, RN Outcome: Progressing 02/02/2024 0511 by Taft Sari POUR, RN Outcome: Progressing Goal: Will remain free from infection 02/02/2024 0511 by Taft Sari POUR, RN Outcome: Progressing 02/02/2024 0511 by Taft Sari POUR, RN Outcome: Progressing Goal: Diagnostic test results will improve 02/02/2024 0511 by Taft Sari POUR, RN Outcome: Progressing 02/02/2024 0511 by Taft Sari POUR, RN Outcome: Progressing Goal: Respiratory complications will improve 02/02/2024 0511 by Taft Sari POUR, RN Outcome: Progressing 02/02/2024 0511 by Taft Sari POUR, RN Outcome: Progressing Goal: Cardiovascular complication will be avoided 02/02/2024 0511 by Taft Sari POUR, RN Outcome: Progressing 02/02/2024 0511 by Taft Sari POUR, RN Outcome: Progressing   Problem: Activity: Goal: Risk for activity intolerance will decrease 02/02/2024 0511 by Taft Sari POUR, RN Outcome: Progressing 02/02/2024 0511 by Taft Sari POUR, RN Outcome: Progressing   Problem: Nutrition: Goal: Adequate nutrition will be maintained 02/02/2024 0511 by Taft Sari POUR, RN Outcome: Progressing 02/02/2024 0511 by Taft Sari POUR, RN Outcome: Progressing   Problem: Coping: Goal: Level of anxiety will decrease 02/02/2024 0511 by Taft Sari POUR, RN Outcome: Progressing 02/02/2024 0511 by Taft Sari POUR, RN Outcome: Progressing   Problem: Elimination: Goal: Will not experience complications related to bowel motility 02/02/2024 0511 by Taft Sari POUR, RN Outcome: Progressing 02/02/2024 0511 by Taft Sari POUR, RN Outcome: Progressing Goal: Will not experience complications related to urinary retention 02/02/2024 0511 by Taft Sari POUR, RN Outcome: Progressing 02/02/2024 0511 by Taft Sari POUR, RN Outcome: Progressing   Problem: Pain Managment: Goal: General experience of comfort will improve and/or be controlled 02/02/2024 0511 by Taft Sari POUR, RN Outcome: Progressing 02/02/2024 0511 by Taft Sari POUR, RN Outcome: Progressing   Problem: Safety: Goal: Ability to remain free from injury will improve 02/02/2024 0511 by Taft Sari POUR, RN Outcome: Progressing 02/02/2024 0511 by Taft Sari POUR, RN Outcome: Progressing   Problem: Skin Integrity: Goal: Risk for impaired skin integrity will decrease 02/02/2024 0511 by Taft Sari POUR, RN Outcome: Progressing 02/02/2024 0511 by Taft Sari POUR, RN Outcome: Progressing

## 2024-02-02 NOTE — TOC Progression Note (Signed)
 Transition of Care (TOC) - Progression Note    Patient Details  Name: Anthony A Scripter Jr. MRN: [id number] Date of Birth: [birth year]  Transition of Care [facility]) CM/SW Contact  Elycia Woodside A [place], LCSW Phone Number: 02/02/2024, 10:43 AM  Clinical Narrative:     CSW spoke with Delayne at [facility] home. CSW resent pt's FL2, she stated she would look over pt's level of care needs and plan to visit if placement is still viable. She said she would reach out to CSW or RNCM and let us  know of a possible date for in-person visit with pt.    TOC will continue to follow.   Expected Discharge Plan: Skilled Nursing Facility Barriers to Discharge: Continued Medical Work up  Expected Discharge Plan and Services In-house Referral: Clinical Social Work     Living arrangements for the past 2 months: Homeless                                       Social Determinants of Health (SDOH) Interventions SDOH Screenings   Food Insecurity: Patient Unable To Answer (12/09/2023)  Housing: Patient Unable To Answer (12/09/2023)  Transportation Needs: Patient Unable To Answer (12/09/2023)  Utilities: Patient Unable To Answer (12/09/2023)  Depression (PHQ2-9): Medium Risk (10/16/2020)  Social Connections: Unknown (12/22/2023)  Tobacco Use: High Risk (12/08/2023)    Readmission Risk Interventions    01/14/2024    9:48 AM 08/27/2023   12:21 PM 08/19/2023    9:17 AM  Readmission Risk Prevention Plan  Transportation Screening Complete Complete Complete  Medication Review Oceanographer) Complete Complete Complete  PCP or Specialist appointment within 3-5 days of discharge Complete Complete   HRI or Home Care Consult Complete Complete Complete  SW Recovery Care/Counseling Consult Complete Complete Complete  Palliative Care Screening Not Applicable Not Applicable Not Applicable  Skilled Nursing Facility Not Applicable Not Applicable Not Applicable

## 2024-02-02 NOTE — Progress Notes (Signed)
 PROGRESS NOTE    Anthony A Clinger Jr.  FMW:[id number] DOB: Dec 18, 1955 DOA: 12/07/2023 PCP: Pcp, No   Brief Narrative:  68 y.o. male with PMH significant for homelessness, chronic alcoholism c/b withdrawals/DTs, chronic smoking, HTN, paroxysmal A-fib, h/o PE previously on Eliquis , hiatal hernia s/p Nissen fundoplication 2016, chronic low back pain, chronic anxiety, opiate withdrawal presented to the ED for confusion, low blood sugar.  From hospital stay.  Admitted on 12/07/2023. Seen by critical care, Cardiology, GI, neurology, psychiatry and palliative care.   Assessment & Plan:   Principal Problem:   Hypoglycemia Active Problems:   Paroxysmal atrial fibrillation (HCC)   Hyponatremia   HTN (hypertension)   Macrocytic anemia   History of pulmonary embolus (PE)   Alcohol  withdrawal (HCC)   Alcohol  withdrawal delirium (HCC)   Moderate protein malnutrition (HCC)   Lactic acidosis   Acute delirium   Atrial fibrillation with RVR (HCC)  SIRS  Resolved.  Completed empiric 5-day course of Zosyn .  Remains afebrile and stable   Orthostatic hypotension. Blood pressure dropped significantly on standing up. Received IV fluid bolus.  Initiated on midodrine . Due to CHF, not a good candidate for Florinef . Able to ambulate after initiation of midodrine . Continue with abdominal binder and compression stockings as well. Cosyntropin  stim test negative.  Feels dizzy intermittently with low blood pressure.  Continues to have positive orthostatics.  Hypotension but diastolic remains slightly elevated when not orthostatic thus I will continue current dose of midodrine  5 mg 3 times daily.   Acute metabolic encephalopathy secondary to alcohol  withdrawal  Currently resolved. Requiring ICU stay with Precedex .  Monitor.   Retroperitoneal hematoma/chronic iron  deficiency anemia/acute blood loss anemia  S/p 5 units total RBCs during course of hospital stay. Hemoglobin stable.  No active bleeding.  Continue  Eliquis    Paroxysmal A-fib with RVR - Initially on Cardizem  drip.  Has intermittent tachycardia.   Would avoid Cardizem  secondary to low EF. Currently on amiodarone  and Eliquis .   Acute on chronic HFrEF  EF in the past have been around 45%. EF this admission is 35 to 45%. Cardiology was consulted earlier. GDMT is limited by hypotension. Appears to be euvolemic for now.   Hyponatremia  mild, overall stable.  Currently on urea  packet only.   Hypokalemia/hypomagnesemia  Providing IV and oral magnesium  replacement. Maintain K more than 4.   Goals of care - Patient is DNR.  Palliative care was consulted.   Disposition. Patient was homeless, TOC working on dispo discharge disposition. Per psychiatry 6/15 the patient lacks capacity for decision-making. However, pt is now very conversive, oriented, able to make his own decisions.   APS involved however now stating they cannot pursue guardianship.  Appears patient does not want to pursue ALF and stated he would walk out if placed there. Is willing to go back to the community either boarding house or similar. Working closely with TOC on disposition planning aligning with the patient's goals.    Moderate protein calorie malnutrition. Continue supplementation.   Reported diarrhea. Constipated. Patient reports that he has multiple diarrhea although RN is not able to corroborate with me about that history.  Diarrhea will explain hypokalemia and hypomagnesemia though.  For now monitor. Will discontinue bowel regimen.   DVT prophylaxis: Place and maintain sequential compression device Start: 01/30/24 1209 Place TED hose Start: 01/30/24 1209   Code Status: Limited: Do not attempt resuscitation (DNR) -DNR-LIMITED -Do Not Intubate/DNI   Family Communication:  None present at bedside.    Status is:  Inpatient Remains inpatient appropriate because: Difficult to place.  Medically stable for long time.   Estimated body mass index is 24.32 kg/m  as calculated from the following:   Height as of this encounter: 5' 6 (1.676 m).   Weight as of this encounter: 68.4 kg.    Nutritional Assessment: Body mass index is 24.32 kg/m.SABRA Seen by dietician.  I agree with the assessment and plan as outlined below: Nutrition Status: Nutrition Problem: Moderate Malnutrition Etiology: social / environmental circumstances (ETOH use, homeless) Signs/Symptoms: moderate fat depletion, moderate muscle depletion Interventions: Refer to RD note for recommendations  . Skin Assessment: I have examined the patient's skin and I agree with the wound assessment as performed by the wound care RN as outlined below:    Consultants:    Procedures:    Antimicrobials:  Anti-infectives (From admission, onward)    Start     Dose/Rate Route Frequency Ordered Stop   01/03/24 0000  vancomycin  (VANCOREADY) IVPB 750 mg/150 mL  Status:  Discontinued        750 mg 150 mL/hr over 60 Minutes Intravenous Every 12 hours 01/02/24 1001 01/04/24 1135   01/02/24 1100  vancomycin  (VANCOREADY) IVPB 1500 mg/300 mL        1,500 mg 150 mL/hr over 120 Minutes Intravenous  Once 01/02/24 1000 01/02/24 1325   01/02/24 0400  piperacillin -tazobactam (ZOSYN ) IVPB 3.375 g  Status:  Discontinued        3.375 g 12.5 mL/hr over 240 Minutes Intravenous Every 8 hours 01/02/24 0231 01/06/24 1358   12/09/23 1100  metroNIDAZOLE  (FLAGYL ) IVPB 500 mg  Status:  Discontinued        500 mg 100 mL/hr over 60 Minutes Intravenous Every 12 hours 12/09/23 1004 12/14/23 1044   12/09/23 1000  cefTRIAXone  (ROCEPHIN ) 2 g in sodium chloride  0.9 % 100 mL IVPB  Status:  Discontinued        2 g 200 mL/hr over 30 Minutes Intravenous Every 24 hours 12/09/23 0908 12/14/23 1044         Subjective: Seen and examined.  Complains of dizziness with low blood pressure this morning.  No other complaint.  Objective: Vitals:   02/02/24 0017 02/02/24 0259 02/02/24 0651 02/02/24 0812  BP: 106/86 115/81  (!)  106/93  Pulse: 99 86  89  Resp: 18 19  17   Temp: 97.8 F (36.6 C) (!) 97.5 F (36.4 C)  (!) 97.4 F (36.3 C)  TempSrc:      SpO2: 98% 98% 99% 98%  Weight:      Height:        Intake/Output Summary (Last 24 hours) at 02/02/2024 0952 Last data filed at 02/02/2024 0654 Gross per 24 hour  Intake 500 ml  Output 2175 ml  Net -1675 ml   Filed Weights   01/27/24 0452 01/28/24 0500 01/30/24 0400  Weight: 66.6 kg 65.9 kg 68.4 kg    Examination:  General exam: Appears calm and comfortable  Respiratory system: Clear to auscultation. Respiratory effort normal. Cardiovascular system: S1 & S2 heard, RRR. No JVD, murmurs, rubs, gallops or clicks. No pedal edema. Gastrointestinal system: Abdomen is nondistended, soft and nontender. No organomegaly or masses felt. Normal bowel sounds heard. Central nervous system: Alert and oriented. No focal neurological deficits. Extremities: Symmetric 5 x 5 power. Skin: No rashes, lesions or ulcers   Data Reviewed: I have personally reviewed following labs and imaging studies  CBC: Recent Labs  Lab 01/27/24 1630 02/02/24 0310  WBC 4.6 4.2  HGB 12.2* 12.7*  HCT 37.8* 38.5*  MCV 98.2 97.5  PLT 259 227   Basic Metabolic Panel: Recent Labs  Lab 01/29/24 0254 01/30/24 0550 01/31/24 0419 02/01/24 0814 02/02/24 0310  NA 137 136 136 136 138  K 4.1 4.0 4.0 4.1 3.7  CL 108 106 105 102 104  CO2 23 23 23 23 22   GLUCOSE 96 95 92 83 128*  BUN 36* 28* 25* 31* 49*  CREATININE 0.64 0.62 0.71 0.66 0.85  CALCIUM  9.3 9.3 9.3 9.5 9.5  MG 1.6* 1.6* 1.7 1.6* 2.2   GFR: Estimated Creatinine Clearance: 75.1 mL/min (by C-G formula based on SCr of 0.85 mg/dL). Liver Function Tests: No results for input(s): AST, ALT, ALKPHOS, BILITOT, PROT, ALBUMIN  in the last 168 hours. No results for input(s): LIPASE, AMYLASE in the last 168 hours. No results for input(s): AMMONIA in the last 168 hours. Coagulation Profile: No results for input(s):  INR, PROTIME in the last 168 hours. Cardiac Enzymes: No results for input(s): CKTOTAL, CKMB, CKMBINDEX, TROPONINI in the last 168 hours. BNP (last 3 results) No results for input(s): PROBNP in the last 8760 hours. HbA1C: No results for input(s): HGBA1C in the last 72 hours. CBG: Recent Labs  Lab 01/27/24 0824 01/27/24 1125 01/27/24 1636 01/27/24 2010 01/29/24 2021  GLUCAP 96 97 137* 133* 102*   Lipid Profile: No results for input(s): CHOL, HDL, LDLCALC, TRIG, CHOLHDL, LDLDIRECT in the last 72 hours. Thyroid Function Tests: No results for input(s): TSH, T4TOTAL, FREET4, T3FREE, THYROIDAB in the last 72 hours. Anemia Panel: No results for input(s): VITAMINB12, FOLATE, FERRITIN, TIBC, IRON , RETICCTPCT in the last 72 hours. Sepsis Labs: No results for input(s): PROCALCITON, LATICACIDVEN in the last 168 hours.  No results found for this or any previous visit (from the past 240 hours).   Radiology Studies: No results found.  Scheduled Meds:  amiodarone   200 mg Oral BID   apixaban   5 mg Oral BID   ascorbic acid   500 mg Oral Daily   famotidine   20 mg Oral BID   feeding supplement  237 mL Oral BID BM   Gerhardt's butt cream   Topical BID   lidocaine   1 patch Transdermal Q24H   magnesium  chloride  2 tablet Oral Daily   midodrine   5 mg Oral TID WC   multivitamin with minerals  1 tablet Oral Daily   nutrition supplement (JUVEN)  1 packet Oral BID BM   urea   15 g Oral BID   Continuous Infusions:   LOS: 56 days   Fredia Skeeter, MD [facility]  02/02/2024, 9:52 AM   *Please note that this is a verbal dictation therefore any spelling or grammatical errors are due to the Dragon Medical One system interpretation.  Please page via Amion and do not message via secure chat for urgent patient care matters. Secure chat can be used for non urgent patient care matters.  How to contact the [facility] Attending or Consulting  provider 7A - 7P or covering provider during after hours 7P -7A, for this patient?  Check the care team in [facility] and look for a) attending/consulting [facility] provider listed and b) the [facility] team listed. Page or secure chat 7A-7P. Log into [email or web address] and use [place] universal password to access. If you do not have the password, please contact the hospital operator. Locate the [facility] provider you are looking for under [facility] and page to a number that you can be directly reached. If you still have difficulty  reaching the provider, please page the [facility] (Director on Call) for the Hospitalists listed on amion for assistance.

## 2024-02-03 DIAGNOSIS — E162 Hypoglycemia, unspecified: Secondary | ICD-10-CM | POA: Diagnosis not present

## 2024-02-03 MED ORDER — MIDODRINE HCL 5 MG PO TABS
10.0000 mg | ORAL_TABLET | Freq: Three times a day (TID) | ORAL | Status: DC
  Administered 2024-02-03 – 2024-02-19 (×44): 10 mg via ORAL
  Filled 2024-02-03 (×46): qty 2

## 2024-02-03 NOTE — Progress Notes (Signed)
 PROGRESS NOTE    Anthony A Escorcia Jr.  FMW:[id number] DOB: Nov 19, 1955 DOA: 12/07/2023 PCP: Pcp, No   Brief Narrative:  68 y.o. male with PMH significant for homelessness, chronic alcoholism c/b withdrawals/DTs, chronic smoking, HTN, paroxysmal A-fib, h/o PE previously on Eliquis , hiatal hernia s/p Nissen fundoplication 2016, chronic low back pain, chronic anxiety, opiate withdrawal presented to the ED for confusion, low blood sugar.  From hospital stay.  Admitted on 12/07/2023. Seen by critical care, Cardiology, GI, neurology, psychiatry and palliative care.   Assessment & Plan:   Principal Problem:   Hypoglycemia Active Problems:   Paroxysmal atrial fibrillation (HCC)   Hyponatremia   HTN (hypertension)   Macrocytic anemia   History of pulmonary embolus (PE)   Alcohol  withdrawal (HCC)   Alcohol  withdrawal delirium (HCC)   Moderate protein malnutrition (HCC)   Lactic acidosis   Acute delirium   Atrial fibrillation with RVR (HCC)  SIRS  Resolved.  Completed empiric 5-day course of Zosyn .  Remains afebrile and stable   Orthostatic hypotension. Blood pressure dropped significantly on standing up. Received IV fluid bolus.  Initiated on midodrine . Due to CHF, not a good candidate for Florinef . Able to ambulate after initiation of midodrine . Continue with abdominal binder and compression stockings as well. Cosyntropin  stim test negative.  Still feels dizzy intermittently and continues to have positive orthostatics.  Will now increase midodrine  to 10 mg 3 times daily.   Acute metabolic encephalopathy secondary to alcohol  withdrawal  Currently resolved. Requiring ICU stay with Precedex .  Monitor.   Retroperitoneal hematoma/chronic iron  deficiency anemia/acute blood loss anemia  S/p 5 units total RBCs during course of hospital stay. Hemoglobin stable.  No active bleeding.  Continue Eliquis    Paroxysmal A-fib with RVR - Initially on Cardizem  drip.  Has intermittent tachycardia.    Would avoid Cardizem  secondary to low EF. Currently on amiodarone  and Eliquis .   Acute on chronic HFrEF  EF in the past have been around 45%. EF this admission is 35 to 45%. Cardiology was consulted earlier. GDMT is limited by hypotension. Appears to be euvolemic for now.   Hyponatremia  mild, overall stable.  Currently on urea  packet only.   Hypokalemia/hypomagnesemia  Providing IV and oral magnesium  replacement. Maintain K more than 4.   Goals of care - Patient is DNR.  Palliative care was consulted.   Disposition. Patient was homeless, TOC working on dispo discharge disposition. Per psychiatry 6/15 the patient lacks capacity for decision-making. However, pt is now very conversive, oriented, able to make his own decisions.   APS involved however now stating they cannot pursue guardianship.  Appears patient does not want to pursue ALF and stated he would walk out if placed there. Is willing to go back to the community either boarding house or similar. Working closely with TOC on disposition planning aligning with the patient's goals.    Moderate protein calorie malnutrition. Continue supplementation.   Reported diarrhea. Constipated. Patient reports that he has multiple diarrhea although RN is not able to corroborate with me about that history.  Diarrhea will explain hypokalemia and hypomagnesemia though.  For now monitor. Will discontinue bowel regimen.   DVT prophylaxis: Place and maintain sequential compression device Start: 01/30/24 1209 Place TED hose Start: 01/30/24 1209   Code Status: Limited: Do not attempt resuscitation (DNR) -DNR-LIMITED -Do Not Intubate/DNI   Family Communication:  None present at bedside.    Status is: Inpatient Remains inpatient appropriate because: Difficult to place.  Medically stable for long time.  Estimated body mass index is 23.39 kg/m as calculated from the following:   Height as of this encounter: 5' 6 (1.676 m).   Weight as of  this encounter: 65.7 kg.    Nutritional Assessment: Body mass index is 23.39 kg/m.SABRA Seen by dietician.  I agree with the assessment and plan as outlined below: Nutrition Status: Nutrition Problem: Moderate Malnutrition Etiology: social / environmental circumstances (ETOH use, homeless) Signs/Symptoms: moderate fat depletion, moderate muscle depletion Interventions: Refer to RD note for recommendations  . Skin Assessment: I have examined the patient's skin and I agree with the wound assessment as performed by the wound care RN as outlined below:    Consultants:    Procedures:    Antimicrobials:  Anti-infectives (From admission, onward)    Start     Dose/Rate Route Frequency Ordered Stop   01/03/24 0000  vancomycin  (VANCOREADY) IVPB 750 mg/150 mL  Status:  Discontinued        750 mg 150 mL/hr over 60 Minutes Intravenous Every 12 hours 01/02/24 1001 01/04/24 1135   01/02/24 1100  vancomycin  (VANCOREADY) IVPB 1500 mg/300 mL        1,500 mg 150 mL/hr over 120 Minutes Intravenous  Once 01/02/24 1000 01/02/24 1325   01/02/24 0400  piperacillin -tazobactam (ZOSYN ) IVPB 3.375 g  Status:  Discontinued        3.375 g 12.5 mL/hr over 240 Minutes Intravenous Every 8 hours 01/02/24 0231 01/06/24 1358   12/09/23 1100  metroNIDAZOLE  (FLAGYL ) IVPB 500 mg  Status:  Discontinued        500 mg 100 mL/hr over 60 Minutes Intravenous Every 12 hours 12/09/23 1004 12/14/23 1044   12/09/23 1000  cefTRIAXone  (ROCEPHIN ) 2 g in sodium chloride  0.9 % 100 mL IVPB  Status:  Discontinued        2 g 200 mL/hr over 30 Minutes Intravenous Every 24 hours 12/09/23 0908 12/14/23 1044         Subjective: Patient seen and examined.  Complains of intermittent dizziness but improved compared to yesterday.  No other complaint.  Objective: Vitals:   02/02/24 2126 02/03/24 0038 02/03/24 0524 02/03/24 0813  BP: (!) 121/94 118/89 (!) 122/98 (!) 121/90  Pulse: 83 88 86 89  Resp: 16 16 16 19   Temp: 97.6 F  (36.4 C) 97.8 F (36.6 C) (!) 97 F (36.1 C) 98.3 F (36.8 C)  TempSrc: Oral Oral Axillary   SpO2: 100% 99% 100% 100%  Weight:      Height:        Intake/Output Summary (Last 24 hours) at 02/03/2024 1123 Last data filed at 02/03/2024 1007 Gross per 24 hour  Intake 676 ml  Output 1250 ml  Net -574 ml   Filed Weights   01/28/24 0500 01/30/24 0400 02/02/24 1628  Weight: 65.9 kg 68.4 kg 65.7 kg    Examination:  General exam: Appears calm and comfortable  Respiratory system: Clear to auscultation. Respiratory effort normal. Cardiovascular system: S1 & S2 heard, RRR. No JVD, murmurs, rubs, gallops or clicks. No pedal edema. Gastrointestinal system: Abdomen is nondistended, soft and nontender. No organomegaly or masses felt. Normal bowel sounds heard. Central nervous system: Alert and oriented. No focal neurological deficits. Extremities: Symmetric 5 x 5 power. Skin: No rashes, lesions or ulcers.   Data Reviewed: I have personally reviewed following labs and imaging studies  CBC: Recent Labs  Lab 01/27/24 1630 02/02/24 0310  WBC 4.6 4.2  HGB 12.2* 12.7*  HCT 37.8* 38.5*  MCV 98.2 97.5  PLT 259 227   Basic Metabolic Panel: Recent Labs  Lab 01/29/24 0254 01/30/24 0550 01/31/24 0419 02/01/24 0814 02/02/24 0310  NA 137 136 136 136 138  K 4.1 4.0 4.0 4.1 3.7  CL 108 106 105 102 104  CO2 23 23 23 23 22   GLUCOSE 96 95 92 83 128*  BUN 36* 28* 25* 31* 49*  CREATININE 0.64 0.62 0.71 0.66 0.85  CALCIUM  9.3 9.3 9.3 9.5 9.5  MG 1.6* 1.6* 1.7 1.6* 2.2   GFR: Estimated Creatinine Clearance: 75.1 mL/min (by C-G formula based on SCr of 0.85 mg/dL). Liver Function Tests: No results for input(s): AST, ALT, ALKPHOS, BILITOT, PROT, ALBUMIN  in the last 168 hours. No results for input(s): LIPASE, AMYLASE in the last 168 hours. No results for input(s): AMMONIA in the last 168 hours. Coagulation Profile: No results for input(s): INR, PROTIME in the last 168  hours. Cardiac Enzymes: No results for input(s): CKTOTAL, CKMB, CKMBINDEX, TROPONINI in the last 168 hours. BNP (last 3 results) No results for input(s): PROBNP in the last 8760 hours. HbA1C: No results for input(s): HGBA1C in the last 72 hours. CBG: Recent Labs  Lab 01/27/24 1125 01/27/24 1636 01/27/24 2010 01/29/24 2021  GLUCAP 97 137* 133* 102*   Lipid Profile: No results for input(s): CHOL, HDL, LDLCALC, TRIG, CHOLHDL, LDLDIRECT in the last 72 hours. Thyroid Function Tests: No results for input(s): TSH, T4TOTAL, FREET4, T3FREE, THYROIDAB in the last 72 hours. Anemia Panel: No results for input(s): VITAMINB12, FOLATE, FERRITIN, TIBC, IRON , RETICCTPCT in the last 72 hours. Sepsis Labs: No results for input(s): PROCALCITON, LATICACIDVEN in the last 168 hours.  No results found for this or any previous visit (from the past 240 hours).   Radiology Studies: No results found.  Scheduled Meds:  amiodarone   200 mg Oral BID   apixaban   5 mg Oral BID   ascorbic acid   500 mg Oral Daily   famotidine   20 mg Oral BID   feeding supplement  237 mL Oral BID BM   Gerhardt's butt cream   Topical BID   lidocaine   1 patch Transdermal Q24H   magnesium  chloride  2 tablet Oral Daily   midodrine   10 mg Oral TID WC   multivitamin with minerals  1 tablet Oral Daily   nutrition supplement (JUVEN)  1 packet Oral BID BM   urea   15 g Oral BID   Continuous Infusions:   LOS: 57 days   Fredia Skeeter, MD [facility]  02/03/2024, 11:23 AM   *Please note that this is a verbal dictation therefore any spelling or grammatical errors are due to the Dragon Medical One system interpretation.  Please page via Amion and do not message via secure chat for urgent patient care matters. Secure chat can be used for non urgent patient care matters.  How to contact the [facility] Attending or Consulting provider 7A - 7P or covering provider during after hours  7P -7A, for this patient?  Check the care team in [facility] and look for a) attending/consulting [facility] provider listed and b) the [facility] team listed. Page or secure chat 7A-7P. Log into [email or web address] and use [place]'s universal password to access. If you do not have the password, please contact the hospital operator. Locate the [facility] provider you are looking for under [facility] and page to a number that you can be directly reached. If you still have difficulty reaching the provider, please page the [facility] (Director on Call) for the Hospitalists listed on  amion for assistance.

## 2024-02-03 NOTE — Plan of Care (Signed)

## 2024-02-04 DIAGNOSIS — E162 Hypoglycemia, unspecified: Secondary | ICD-10-CM | POA: Diagnosis not present

## 2024-02-04 NOTE — Progress Notes (Signed)
 PROGRESS NOTE    Anthony A Schaper Jr.  FMW:[id number] DOB: 09-07-1955 DOA: 12/07/2023 PCP: Pcp, No   Brief Narrative:  68 y.o. male with PMH significant for homelessness, chronic alcoholism c/b withdrawals/DTs, chronic smoking, HTN, paroxysmal A-fib, h/o PE previously on Eliquis , hiatal hernia s/p Nissen fundoplication 2016, chronic low back pain, chronic anxiety, opiate withdrawal presented to the ED for confusion, low blood sugar.  From hospital stay.  Admitted on 12/07/2023. Seen by critical care, Cardiology, GI, neurology, psychiatry and palliative care.   Assessment & Plan:   Principal Problem:   Hypoglycemia Active Problems:   Paroxysmal atrial fibrillation (HCC)   Hyponatremia   HTN (hypertension)   Macrocytic anemia   History of pulmonary embolus (PE)   Alcohol  withdrawal (HCC)   Alcohol  withdrawal delirium (HCC)   Moderate protein malnutrition (HCC)   Lactic acidosis   Acute delirium   Atrial fibrillation with RVR (HCC)  SIRS  Resolved.  Completed empiric 5-day course of Zosyn .  Remains afebrile and stable   Orthostatic hypotension. Blood pressure dropped significantly on standing up. Received IV fluid bolus.  Initiated on midodrine . Due to CHF, not a good candidate for Florinef . Able to ambulate after initiation of midodrine . Continue with abdominal binder and compression stockings as well. Cosyntropin  stim test negative.  Still feels dizzy intermittently and continues to have positive orthostatics.  Increased midodrine  to 10 mg 3 times daily on 02/03/2024.  Monitor daily orthostatics.  No changes for next 2 to 3 days.   Acute metabolic encephalopathy secondary to alcohol  withdrawal  Currently resolved. Requiring ICU stay with Precedex .  Monitor.   Retroperitoneal hematoma/chronic iron  deficiency anemia/acute blood loss anemia  S/p 5 units total RBCs during course of hospital stay. Hemoglobin stable.  No active bleeding.  Continue Eliquis    Paroxysmal A-fib with  RVR - Initially on Cardizem  drip.  Has intermittent tachycardia.   Would avoid Cardizem  secondary to low EF. Currently on amiodarone  and Eliquis .   Acute on chronic HFrEF  EF in the past have been around 45%. EF this admission is 35 to 45%. Cardiology was consulted earlier. GDMT is limited by hypotension. Appears to be euvolemic for now.   Hyponatremia  mild, overall stable.  Currently on urea  packet only.   Hypokalemia/hypomagnesemia  Providing IV and oral magnesium  replacement. Maintain K more than 4.   Goals of care - Patient is DNR.  Palliative care was consulted.   Disposition. Patient was homeless, TOC working on dispo discharge disposition. Per psychiatry 6/15 the patient lacks capacity for decision-making. However, pt is now very conversive, oriented, able to make his own decisions.   APS involved however now stating they cannot pursue guardianship.  Appears patient does not want to pursue ALF and stated he would walk out if placed there. Is willing to go back to the community either boarding house or similar. Working closely with TOC on disposition planning aligning with the patient's goals.    Moderate protein calorie malnutrition. Continue supplementation.   Reported diarrhea. Constipated. Patient reports that he has multiple diarrhea although RN is not able to corroborate with me about that history.  Diarrhea will explain hypokalemia and hypomagnesemia though.  For now monitor. Will discontinue bowel regimen.   DVT prophylaxis: Place and maintain sequential compression device Start: 01/30/24 1209 Place TED hose Start: 01/30/24 1209   Code Status: Limited: Do not attempt resuscitation (DNR) -DNR-LIMITED -Do Not Intubate/DNI   Family Communication:  None present at bedside.    Status is: Inpatient Remains  inpatient appropriate because: Difficult to place.  Medically stable for long time.   Estimated body mass index is 23.39 kg/m as calculated from the  following:   Height as of this encounter: 5' 6 (1.676 m).   Weight as of this encounter: 65.7 kg.    Nutritional Assessment: Body mass index is 23.39 kg/m.SABRA Seen by dietician.  I agree with the assessment and plan as outlined below: Nutrition Status: Nutrition Problem: Moderate Malnutrition Etiology: social / environmental circumstances (ETOH use, homeless) Signs/Symptoms: moderate fat depletion, moderate muscle depletion Interventions: Refer to RD note for recommendations  . Skin Assessment: I have examined the patient's skin and I agree with the wound assessment as performed by the wound care RN as outlined below:    Consultants:    Procedures:    Antimicrobials:  Anti-infectives (From admission, onward)    Start     Dose/Rate Route Frequency Ordered Stop   01/03/24 0000  vancomycin  (VANCOREADY) IVPB 750 mg/150 mL  Status:  Discontinued        750 mg 150 mL/hr over 60 Minutes Intravenous Every 12 hours 01/02/24 1001 01/04/24 1135   01/02/24 1100  vancomycin  (VANCOREADY) IVPB 1500 mg/300 mL        1,500 mg 150 mL/hr over 120 Minutes Intravenous  Once 01/02/24 1000 01/02/24 1325   01/02/24 0400  piperacillin -tazobactam (ZOSYN ) IVPB 3.375 g  Status:  Discontinued        3.375 g 12.5 mL/hr over 240 Minutes Intravenous Every 8 hours 01/02/24 0231 01/06/24 1358   12/09/23 1100  metroNIDAZOLE  (FLAGYL ) IVPB 500 mg  Status:  Discontinued        500 mg 100 mL/hr over 60 Minutes Intravenous Every 12 hours 12/09/23 1004 12/14/23 1044   12/09/23 1000  cefTRIAXone  (ROCEPHIN ) 2 g in sodium chloride  0.9 % 100 mL IVPB  Status:  Discontinued        2 g 200 mL/hr over 30 Minutes Intravenous Every 24 hours 12/09/23 0908 12/14/23 1044         Subjective: Seen and examined.  No complaints other than mild intermittent dizziness but he is used to them now.  Objective: Vitals:   02/03/24 1921 02/03/24 2354 02/04/24 0458 02/04/24 0800  BP: 122/76 129/85 121/87 111/89  Pulse: 83 99  (!) 103 94  Resp: 18 18 16 17   Temp: (!) 97.5 F (36.4 C) (!) 97.4 F (36.3 C) 98.1 F (36.7 C) 97.9 F (36.6 C)  TempSrc:   Oral Oral  SpO2: 100% 99% 100%   Weight:      Height:        Intake/Output Summary (Last 24 hours) at 02/04/2024 0920 Last data filed at 02/04/2024 0908 Gross per 24 hour  Intake 260 ml  Output 2875 ml  Net -2615 ml   Filed Weights   01/28/24 0500 01/30/24 0400 02/02/24 1628  Weight: 65.9 kg 68.4 kg 65.7 kg    Examination:  General exam: Appears calm and comfortable  Respiratory system: Clear to auscultation. Respiratory effort normal. Cardiovascular system: S1 & S2 heard, RRR. No JVD, murmurs, rubs, gallops or clicks. No pedal edema. Gastrointestinal system: Abdomen is nondistended, soft and nontender. No organomegaly or masses felt. Normal bowel sounds heard. Central nervous system: Alert and oriented. No focal neurological deficits. Extremities: Symmetric 5 x 5 power. Skin: No rashes, lesions or ulcers.    Data Reviewed: I have personally reviewed following labs and imaging studies  CBC: Recent Labs  Lab 02/02/24 0310  WBC 4.2  HGB 12.7*  HCT 38.5*  MCV 97.5  PLT 227   Basic Metabolic Panel: Recent Labs  Lab 01/29/24 0254 01/30/24 0550 01/31/24 0419 02/01/24 0814 02/02/24 0310  NA 137 136 136 136 138  K 4.1 4.0 4.0 4.1 3.7  CL 108 106 105 102 104  CO2 23 23 23 23 22   GLUCOSE 96 95 92 83 128*  BUN 36* 28* 25* 31* 49*  CREATININE 0.64 0.62 0.71 0.66 0.85  CALCIUM  9.3 9.3 9.3 9.5 9.5  MG 1.6* 1.6* 1.7 1.6* 2.2   GFR: Estimated Creatinine Clearance: 75.1 mL/min (by C-G formula based on SCr of 0.85 mg/dL). Liver Function Tests: No results for input(s): AST, ALT, ALKPHOS, BILITOT, PROT, ALBUMIN  in the last 168 hours. No results for input(s): LIPASE, AMYLASE in the last 168 hours. No results for input(s): AMMONIA in the last 168 hours. Coagulation Profile: No results for input(s): INR, PROTIME in the last  168 hours. Cardiac Enzymes: No results for input(s): CKTOTAL, CKMB, CKMBINDEX, TROPONINI in the last 168 hours. BNP (last 3 results) No results for input(s): PROBNP in the last 8760 hours. HbA1C: No results for input(s): HGBA1C in the last 72 hours. CBG: Recent Labs  Lab 01/29/24 2021  GLUCAP 102*   Lipid Profile: No results for input(s): CHOL, HDL, LDLCALC, TRIG, CHOLHDL, LDLDIRECT in the last 72 hours. Thyroid Function Tests: No results for input(s): TSH, T4TOTAL, FREET4, T3FREE, THYROIDAB in the last 72 hours. Anemia Panel: No results for input(s): VITAMINB12, FOLATE, FERRITIN, TIBC, IRON , RETICCTPCT in the last 72 hours. Sepsis Labs: No results for input(s): PROCALCITON, LATICACIDVEN in the last 168 hours.  No results found for this or any previous visit (from the past 240 hours).   Radiology Studies: No results found.  Scheduled Meds:  amiodarone   200 mg Oral BID   apixaban   5 mg Oral BID   ascorbic acid   500 mg Oral Daily   famotidine   20 mg Oral BID   feeding supplement  237 mL Oral BID BM   Gerhardt's butt cream   Topical BID   lidocaine   1 patch Transdermal Q24H   magnesium  chloride  2 tablet Oral Daily   midodrine   10 mg Oral TID WC   multivitamin with minerals  1 tablet Oral Daily   nutrition supplement (JUVEN)  1 packet Oral BID BM   urea   15 g Oral BID   Continuous Infusions:   LOS: 58 days   Fredia Skeeter, MD [facility]  02/04/2024, 9:20 AM   *Please note that this is a verbal dictation therefore any spelling or grammatical errors are due to the Dragon Medical One system interpretation.  Please page via Amion and do not message via secure chat for urgent patient care matters. Secure chat can be used for non urgent patient care matters.  How to contact the [facility] Attending or Consulting provider 7A - 7P or covering provider during after hours 7P -7A, for this patient?  Check the care team in [facility]  and look for a) attending/consulting TR[facility] provider listed and b) the [facility] team listed. Page or secure chat 7A-7P. Log into [email or web address] and use [place]'s universal password to access. If you do not have the password, please contact the hospital operator. Locate the [facility] provider you are looking for under [facility] and page to a number that you can be directly reached. If you still have difficulty reaching the provider, please page the [facility] (Director on Call) for the Hospitalists listed on amion  for assistance.

## 2024-02-04 NOTE — Plan of Care (Signed)

## 2024-02-04 NOTE — Plan of Care (Signed)
  Problem: Education: Goal: Knowledge of General Education information will improve Description: Including pain rating scale, medication(s)/side effects and non-pharmacologic comfort measures 02/04/2024 2339 by Evern Monica HERO, RN Outcome: Progressing 02/04/2024 2300 by Evern Monica HERO, RN Outcome: Progressing   Problem: Health Behavior/Discharge Planning: Goal: Ability to manage health-related needs will improve 02/04/2024 2339 by Evern Monica HERO, RN Outcome: Progressing 02/04/2024 2300 by Evern Monica HERO, RN Outcome: Progressing   Problem: Clinical Measurements: Goal: Ability to maintain clinical measurements within normal limits will improve 02/04/2024 2339 by Evern Monica HERO, RN Outcome: Progressing 02/04/2024 2300 by Evern Monica HERO, RN Outcome: Progressing Goal: Will remain free from infection 02/04/2024 2339 by Evern Monica HERO, RN Outcome: Progressing 02/04/2024 2300 by Evern Monica HERO, RN Outcome: Progressing Goal: Diagnostic test results will improve 02/04/2024 2339 by Evern Monica HERO, RN Outcome: Progressing 02/04/2024 2300 by Evern Monica HERO, RN Outcome: Progressing Goal: Respiratory complications will improve 02/04/2024 2339 by Evern Monica HERO, RN Outcome: Progressing 02/04/2024 2300 by Evern Monica HERO, RN Outcome: Progressing Goal: Cardiovascular complication will be avoided 02/04/2024 2339 by Evern Monica HERO, RN Outcome: Progressing 02/04/2024 2300 by Evern Monica HERO, RN Outcome: Progressing   Problem: Activity: Goal: Risk for activity intolerance will decrease 02/04/2024 2339 by Evern Monica HERO, RN Outcome: Progressing 02/04/2024 2300 by Evern Monica HERO, RN Outcome: Progressing   Problem: Nutrition: Goal: Adequate nutrition will be maintained 02/04/2024 2339 by Evern Monica HERO, RN Outcome: Progressing 02/04/2024 2300 by Evern Monica HERO, RN Outcome: Progressing   Problem: Coping: Goal: Level of anxiety will decrease 02/04/2024  2339 by Evern Monica HERO, RN Outcome: Progressing 02/04/2024 2300 by Evern Monica HERO, RN Outcome: Progressing   Problem: Elimination: Goal: Will not experience complications related to bowel motility 02/04/2024 2339 by Evern Monica HERO, RN Outcome: Progressing 02/04/2024 2300 by Evern Monica HERO, RN Outcome: Progressing Goal: Will not experience complications related to urinary retention 02/04/2024 2339 by Evern Monica HERO, RN Outcome: Progressing 02/04/2024 2300 by Evern Monica HERO, RN Outcome: Progressing   Problem: Pain Managment: Goal: General experience of comfort will improve and/or be controlled 02/04/2024 2339 by Evern Monica HERO, RN Outcome: Progressing 02/04/2024 2300 by Evern Monica HERO, RN Outcome: Progressing   Problem: Safety: Goal: Ability to remain free from injury will improve 02/04/2024 2339 by Evern Monica HERO, RN Outcome: Progressing 02/04/2024 2300 by Evern Monica HERO, RN Outcome: Progressing   Problem: Skin Integrity: Goal: Risk for impaired skin integrity will decrease 02/04/2024 2339 by Evern Monica HERO, RN Outcome: Progressing 02/04/2024 2300 by Evern Monica HERO, RN Outcome: Progressing

## 2024-02-04 NOTE — Progress Notes (Addendum)
 Nutrition Follow-up  DOCUMENTATION CODES:   Non-severe (moderate) malnutrition in context of chronic illness  INTERVENTION:   Encourage PO intake  Continue Magic cup TID with meals Continue to offer Ensure Plus High Protein po BID Continue 1 packet Juven BID to support wound healing Continue MVI with minerals daily  NUTRITION DIAGNOSIS:   Moderate Malnutrition related to social / environmental circumstances (ETOH use, homeless) as evidenced by moderate fat depletion, moderate muscle depletion. - Still applicable   GOAL:   Patient will meet greater than or equal to 90% of their needs - Meeting, pt eating 75-100% of meals and taking supplements   MONITOR:   Diet advancement, Labs, Weight trends, TF tolerance  REASON FOR ASSESSMENT:   Consult Assessment of nutrition requirement/status  ASSESSMENT:   68 yo male admitted with EtOH withdrawal with DTs, lactic acidosis, concern for possible sepsis-?biliary source. PMH includes EtOH abuse with recurrent admissions for DTs, tobacco use, hiatal hernia s/p nissen (2016), HTN. Pt is homeless  Spoke to pt at bedside, Pt reports he is eating well around 75-100% of his meals. He reports he orders extra food to save for later. Is taking all his supplements. Reports good appetite, no N/V. Weight stable. Placement continues to be the barrier for d/c. Pt will no other nutritional questions/concerns at this time. RD will SIGN off at this time, If additional nutrition issues arise, please reconsult RD.     Admit weight: 65 kg Current weight: 65.7 kg   Average Meal Intake: 6/19-7/17: 75-100%% intake x 8 recorded meals  Nutritionally Relevant Medications: Scheduled Meds:  amiodarone   200 mg Oral BID   apixaban   5 mg Oral BID   ascorbic acid   500 mg Oral Daily   famotidine   20 mg Oral BID   feeding supplement  237 mL Oral BID BM   Gerhardt's butt cream   Topical BID   lidocaine   1 patch Transdermal Q24H   magnesium  chloride  2 tablet  Oral Daily   midodrine   10 mg Oral TID WC   multivitamin with minerals  1 tablet Oral Daily   nutrition supplement (JUVEN)  1 packet Oral BID BM   urea   15 g Oral BID   Labs Reviewed: BUN 49 Ast 52 CBG ranges from 83-128 mg/dL over the last 24 hours HgbA1c 5.2   Diet Order:   Diet Order             Diet regular Room service appropriate? Yes; Fluid consistency: Thin  Diet effective now                   EDUCATION NEEDS:   No education needs have been identified at this time  Skin:  Skin Assessment: Reviewed RN Assessment Skin Integrity Issues:: Stage III Stage II: N/A Stage III: Removed from [facility] Other: IAD to rectum, full thickness lt lower leg wound  Last BM:  02/03/2024,  Height:   Ht Readings from Last 1 Encounters:  12/22/23 5' 6 (1.676 m)    Weight:   Wt Readings from Last 1 Encounters:  02/02/24 65.7 kg    Ideal Body Weight:  70 kg  BMI:  Body mass index is 23.39 kg/m.  Estimated Nutritional Needs:   Kcal:  1900-2100  Protein:  95-110g  Fluid:  >/=1.9L   Olivia Kenning, RD Registered Dietitian  See Amion for more information

## 2024-02-05 DIAGNOSIS — E162 Hypoglycemia, unspecified: Secondary | ICD-10-CM | POA: Diagnosis not present

## 2024-02-05 NOTE — Plan of Care (Signed)

## 2024-02-05 NOTE — Progress Notes (Signed)
 PROGRESS NOTE    Anthony A Hopes Jr.  FMW:[id number] DOB: 22-Dec-1955 DOA: 12/07/2023 PCP: Pcp, No   Brief Narrative:  68 y.o. male with PMH significant for homelessness, chronic alcoholism c/b withdrawals/DTs, chronic smoking, HTN, paroxysmal A-fib, h/o PE previously on Eliquis , hiatal hernia s/p Nissen fundoplication 2016, chronic low back pain, chronic anxiety, opiate withdrawal presented to the ED for confusion, low blood sugar.  From hospital stay.  Admitted on 12/07/2023. Seen by critical care, Cardiology, GI, neurology, psychiatry and palliative care.   Assessment & Plan:   Principal Problem:   Hypoglycemia Active Problems:   Paroxysmal atrial fibrillation (HCC)   Hyponatremia   HTN (hypertension)   Macrocytic anemia   History of pulmonary embolus (PE)   Alcohol  withdrawal (HCC)   Alcohol  withdrawal delirium (HCC)   Moderate protein malnutrition (HCC)   Lactic acidosis   Acute delirium   Atrial fibrillation with RVR (HCC)  SIRS  Resolved.  Completed empiric 5-day course of Zosyn .  Remains afebrile and stable   Orthostatic hypotension. Blood pressure dropped significantly on standing up. Received IV fluid bolus.  Initiated on midodrine . Due to CHF, not a good candidate for Florinef . Able to ambulate after initiation of midodrine . Continue with abdominal binder and compression stockings as well. Cosyntropin  stim test negative.  Still feels dizzy intermittently and continues to have positive orthostatics.  Increased midodrine  to 10 mg 3 times daily on 02/03/2024.  Monitor daily orthostatics.  No changes for next 2 to 3 days.   Acute metabolic encephalopathy secondary to alcohol  withdrawal  Currently resolved. Requiring ICU stay with Precedex .  Monitor.   Retroperitoneal hematoma/chronic iron  deficiency anemia/acute blood loss anemia  S/p 5 units total RBCs during course of hospital stay. Hemoglobin stable.  No active bleeding.  Continue Eliquis    Paroxysmal A-fib with  RVR - Initially on Cardizem  drip.  Has intermittent tachycardia.   Would avoid Cardizem  secondary to low EF. Currently on amiodarone  and Eliquis .   Acute on chronic HFrEF  EF in the past have been around 45%. EF this admission is 35 to 45%. Cardiology was consulted earlier. GDMT is limited by hypotension. Appears to be euvolemic for now.   Hyponatremia  mild, overall stable.  Currently on urea  packet only.   Hypokalemia/hypomagnesemia  Providing IV and oral magnesium  replacement. Maintain K more than 4.   Goals of care - Patient is DNR.  Palliative care was consulted.   Disposition. Patient was homeless, TOC working on dispo discharge disposition. Per psychiatry 6/15 the patient lacks capacity for decision-making. However, pt is now very conversive, oriented, able to make his own decisions.   APS involved however now stating they cannot pursue guardianship.  Appears patient does not want to pursue ALF and stated he would walk out if placed there. Is willing to go back to the community either boarding house or similar. Working closely with TOC on disposition planning aligning with the patient's goals.  Per TOC, group home is going to talk to him and evaluate him for admission.   Moderate protein calorie malnutrition. Continue supplementation.   Reported diarrhea. Constipated. Patient reports that he has multiple diarrhea although RN is not able to corroborate with me about that history.  Diarrhea will explain hypokalemia and hypomagnesemia though.  For now monitor. Will discontinue bowel regimen.   DVT prophylaxis: Place and maintain sequential compression device Start: 01/30/24 1209 Place TED hose Start: 01/30/24 1209   Code Status: Limited: Do not attempt resuscitation (DNR) -DNR-LIMITED -Do Not Intubate/DNI  Family Communication:  None present at bedside.    Status is: Inpatient Remains inpatient appropriate because: Difficult to place.  Medically stable for long  time.   Estimated body mass index is 23.39 kg/m as calculated from the following:   Height as of this encounter: 5' 6 (1.676 m).   Weight as of this encounter: 65.7 kg.    Nutritional Assessment: Body mass index is 23.39 kg/m.SABRA Seen by dietician.  I agree with the assessment and plan as outlined below: Nutrition Status: Nutrition Problem: Moderate Malnutrition Etiology: social / environmental circumstances (ETOH use, homeless) Signs/Symptoms: moderate fat depletion, moderate muscle depletion Interventions: Refer to RD note for recommendations  . Skin Assessment: I have examined the patient's skin and I agree with the wound assessment as performed by the wound care RN as outlined below:    Consultants:    Procedures:    Antimicrobials:  Anti-infectives (From admission, onward)    Start     Dose/Rate Route Frequency Ordered Stop   01/03/24 0000  vancomycin  (VANCOREADY) IVPB 750 mg/150 mL  Status:  Discontinued        750 mg 150 mL/hr over 60 Minutes Intravenous Every 12 hours 01/02/24 1001 01/04/24 1135   01/02/24 1100  vancomycin  (VANCOREADY) IVPB 1500 mg/300 mL        1,500 mg 150 mL/hr over 120 Minutes Intravenous  Once 01/02/24 1000 01/02/24 1325   01/02/24 0400  piperacillin -tazobactam (ZOSYN ) IVPB 3.375 g  Status:  Discontinued        3.375 g 12.5 mL/hr over 240 Minutes Intravenous Every 8 hours 01/02/24 0231 01/06/24 1358   12/09/23 1100  metroNIDAZOLE  (FLAGYL ) IVPB 500 mg  Status:  Discontinued        500 mg 100 mL/hr over 60 Minutes Intravenous Every 12 hours 12/09/23 1004 12/14/23 1044   12/09/23 1000  cefTRIAXone  (ROCEPHIN ) 2 g in sodium chloride  0.9 % 100 mL IVPB  Status:  Discontinued        2 g 200 mL/hr over 30 Minutes Intravenous Every 24 hours 12/09/23 0908 12/14/23 1044         Subjective: Patient seen and examined.  No new complaint today.  Objective: Vitals:   02/04/24 2356 02/05/24 0441 02/05/24 0444 02/05/24 0830  BP: 101/74 114/75  114/75 125/87  Pulse: (!) 101 90 90 (!) 106  Resp: 19 18 18    Temp: 97.7 F (36.5 C) 97.7 F (36.5 C) 97.7 F (36.5 C) 97.9 F (36.6 C)  TempSrc: Oral Oral Oral   SpO2: 99% 99% 97% 99%  Weight:      Height:        Intake/Output Summary (Last 24 hours) at 02/05/2024 1054 Last data filed at 02/05/2024 0830 Gross per 24 hour  Intake 480 ml  Output 1000 ml  Net -520 ml   Filed Weights   01/28/24 0500 01/30/24 0400 02/02/24 1628  Weight: 65.9 kg 68.4 kg 65.7 kg    Examination:  General exam: Appears calm and comfortable  Respiratory system: Clear to auscultation. Respiratory effort normal. Cardiovascular system: S1 & S2 heard, RRR. No JVD, murmurs, rubs, gallops or clicks. No pedal edema. Gastrointestinal system: Abdomen is nondistended, soft and nontender. No organomegaly or masses felt. Normal bowel sounds heard. Central nervous system: Alert and oriented. No focal neurological deficits. Extremities: Symmetric 5 x 5 power. Skin: No rashes, lesions or ulcers.    Data Reviewed: I have personally reviewed following labs and imaging studies  CBC: Recent Labs  Lab 02/02/24 0310  WBC 4.2  HGB 12.7*  HCT 38.5*  MCV 97.5  PLT 227   Basic Metabolic Panel: Recent Labs  Lab 01/30/24 0550 01/31/24 0419 02/01/24 0814 02/02/24 0310  NA 136 136 136 138  K 4.0 4.0 4.1 3.7  CL 106 105 102 104  CO2 23 23 23 22   GLUCOSE 95 92 83 128*  BUN 28* 25* 31* 49*  CREATININE 0.62 0.71 0.66 0.85  CALCIUM  9.3 9.3 9.5 9.5  MG 1.6* 1.7 1.6* 2.2   GFR: Estimated Creatinine Clearance: 75.1 mL/min (by C-G formula based on SCr of 0.85 mg/dL). Liver Function Tests: No results for input(s): AST, ALT, ALKPHOS, BILITOT, PROT, ALBUMIN  in the last 168 hours. No results for input(s): LIPASE, AMYLASE in the last 168 hours. No results for input(s): AMMONIA in the last 168 hours. Coagulation Profile: No results for input(s): INR, PROTIME in the last 168 hours. Cardiac  Enzymes: No results for input(s): CKTOTAL, CKMB, CKMBINDEX, TROPONINI in the last 168 hours. BNP (last 3 results) No results for input(s): PROBNP in the last 8760 hours. HbA1C: No results for input(s): HGBA1C in the last 72 hours. CBG: Recent Labs  Lab 01/29/24 2021  GLUCAP 102*   Lipid Profile: No results for input(s): CHOL, HDL, LDLCALC, TRIG, CHOLHDL, LDLDIRECT in the last 72 hours. Thyroid Function Tests: No results for input(s): TSH, T4TOTAL, FREET4, T3FREE, THYROIDAB in the last 72 hours. Anemia Panel: No results for input(s): VITAMINB12, FOLATE, FERRITIN, TIBC, IRON , RETICCTPCT in the last 72 hours. Sepsis Labs: No results for input(s): PROCALCITON, LATICACIDVEN in the last 168 hours.  No results found for this or any previous visit (from the past 240 hours).   Radiology Studies: No results found.  Scheduled Meds:  amiodarone   200 mg Oral BID   apixaban   5 mg Oral BID   ascorbic acid   500 mg Oral Daily   famotidine   20 mg Oral BID   feeding supplement  237 mL Oral BID BM   Gerhardt's butt cream   Topical BID   lidocaine   1 patch Transdermal Q24H   magnesium  chloride  2 tablet Oral Daily   midodrine   10 mg Oral TID WC   multivitamin with minerals  1 tablet Oral Daily   nutrition supplement (JUVEN)  1 packet Oral BID BM   urea   15 g Oral BID   Continuous Infusions:   LOS: 59 days   Fredia Skeeter, MD [facility]  02/05/2024, 10:54 AM   *Please note that this is a verbal dictation therefore any spelling or grammatical errors are due to the Dragon Medical One system interpretation.  Please page via Amion and do not message via secure chat for urgent patient care matters. Secure chat can be used for non urgent patient care matters.  How to contact the [facility] Attending or Consulting provider 7A - 7P or covering provider during after hours 7P -7A, for this patient?  Check the care team in [facility] and look for a)  attending/consulting TRH provider listed and b) the TR[facility] team listed. Page or secure chat 7A-7P. Log into [email or web address] and use [place]'s universal password to access. If you do not have the password, please contact the hospital operator. Locate the [facility] provider you are looking for under [facility] and page to a number that you can be directly reached. If you still have difficulty reaching the provider, please page the [facility] (Director on Call) for the Hospitalists listed on amion for assistance.

## 2024-02-06 DIAGNOSIS — E162 Hypoglycemia, unspecified: Secondary | ICD-10-CM | POA: Diagnosis not present

## 2024-02-06 NOTE — Plan of Care (Signed)

## 2024-02-06 NOTE — Progress Notes (Signed)
 PROGRESS NOTE    Anthony A Parcell Jr.  FMW:[id number] DOB: [birth year] DOA: 12/07/2023 PCP: Pcp, No   Brief Narrative:  68 y.o. male with PMH significant for homelessness, chronic alcoholism c/b withdrawals/DTs, chronic smoking, HTN, paroxysmal A-fib, h/o PE previously on Eliquis , hiatal hernia s/p Nissen fundoplication 2016, chronic low back pain, chronic anxiety, opiate withdrawal presented to the ED for confusion, low blood sugar.  From hospital stay.  Admitted on 12/07/2023. Seen by critical care, Cardiology, GI, neurology, psychiatry and palliative care.   Assessment & Plan:   Principal Problem:   Hypoglycemia Active Problems:   Paroxysmal atrial fibrillation (HCC)   Hyponatremia   HTN (hypertension)   Macrocytic anemia   History of pulmonary embolus (PE)   Alcohol  withdrawal (HCC)   Alcohol  withdrawal delirium (HCC)   Moderate protein malnutrition (HCC)   Lactic acidosis   Acute delirium   Atrial fibrillation with RVR (HCC)  SIRS  Resolved.  Completed empiric 5-day course of Zosyn .  Remains afebrile and stable   Orthostatic hypotension. Blood pressure dropped significantly on standing up. Received IV fluid bolus.  Initiated on midodrine . Due to CHF, not a good candidate for Florinef . Able to ambulate after initiation of midodrine . Continue with abdominal binder and compression stockings as well. Cosyntropin  stim test negative.  Increased midodrine  to 10 mg 3 times daily on 02/03/2024. Still complains of intermittent dizziness but improving.  Orthostatics are not positive anymore.   Monitor daily orthostatics.    Acute metabolic encephalopathy secondary to alcohol  withdrawal  Currently resolved. Required ICU stay with Precedex .  Monitor.   Retroperitoneal hematoma/chronic iron  deficiency anemia/acute blood loss anemia  S/p 5 units total RBCs during course of hospital stay. Hemoglobin stable.  No active bleeding.  Continue Eliquis    Paroxysmal A-fib with RVR - Initially  on Cardizem  drip.  Has intermittent tachycardia.   Would avoid Cardizem  secondary to low EF. Currently on amiodarone  and Eliquis .   Acute on chronic HFrEF  EF in the past have been around 45%. EF this admission is 35 to 45%. Cardiology was consulted earlier. GDMT is limited by hypotension. Appears to be euvolemic for now.   Hyponatremia  mild, overall stable.  Currently on urea  packet only.   Hypokalemia/hypomagnesemia  Providing IV and oral magnesium  replacement. Maintain K more than 4.   Goals of care - Patient is DNR.  Palliative care was consulted.   Disposition. Patient was homeless, TOC working on dispo discharge disposition. Per psychiatry 6/15 the patient lacks capacity for decision-making. However, pt is now very conversive, oriented, able to make his own decisions.   APS involved however now stating they cannot pursue guardianship.  Appears patient does not want to pursue ALF and stated he would walk out if placed there. Is willing to go back to the community either boarding house or similar. Working closely with TOC on disposition planning aligning with the patient's goals.  Per TOC, group home is going to talk to him and evaluate him for admission.   Moderate protein calorie malnutrition. Continue supplementation.   Reported diarrhea. Constipated. Patient reports that he has multiple diarrhea although RN is not able to corroborate with me about that history.  Diarrhea will explain hypokalemia and hypomagnesemia though.  For now monitor. Will discontinue bowel regimen.   DVT prophylaxis: Place and maintain sequential compression device Start: 01/30/24 1209 Place TED hose Start: 01/30/24 1209   Code Status: Limited: Do not attempt resuscitation (DNR) -DNR-LIMITED -Do Not Intubate/DNI   Family Communication:  None  present at bedside.    Status is: Inpatient Remains inpatient appropriate because: Difficult to place.  Medically stable for long time.   Estimated body  mass index is 23.39 kg/m as calculated from the following:   Height as of this encounter: 5' 6 (1.676 m).   Weight as of this encounter: 65.7 kg.    Nutritional Assessment: Body mass index is 23.39 kg/m.SABRA Seen by dietician.  I agree with the assessment and plan as outlined below: Nutrition Status: Nutrition Problem: Moderate Malnutrition Etiology: social / environmental circumstances (ETOH use, homeless) Signs/Symptoms: moderate fat depletion, moderate muscle depletion Interventions: Refer to RD note for recommendations  . Skin Assessment: I have examined the patient's skin and I agree with the wound assessment as performed by the wound care RN as outlined below:    Consultants:    Procedures:    Antimicrobials:  Anti-infectives (From admission, onward)    Start     Dose/Rate Route Frequency Ordered Stop   01/03/24 0000  vancomycin  (VANCOREADY) IVPB 750 mg/150 mL  Status:  Discontinued        750 mg 150 mL/hr over 60 Minutes Intravenous Every 12 hours 01/02/24 1001 01/04/24 1135   01/02/24 1100  vancomycin  (VANCOREADY) IVPB 1500 mg/300 mL        1,500 mg 150 mL/hr over 120 Minutes Intravenous  Once 01/02/24 1000 01/02/24 1325   01/02/24 0400  piperacillin -tazobactam (ZOSYN ) IVPB 3.375 g  Status:  Discontinued        3.375 g 12.5 mL/hr over 240 Minutes Intravenous Every 8 hours 01/02/24 0231 01/06/24 1358   12/09/23 1100  metroNIDAZOLE  (FLAGYL ) IVPB 500 mg  Status:  Discontinued        500 mg 100 mL/hr over 60 Minutes Intravenous Every 12 hours 12/09/23 1004 12/14/23 1044   12/09/23 1000  cefTRIAXone  (ROCEPHIN ) 2 g in sodium chloride  0.9 % 100 mL IVPB  Status:  Discontinued        2 g 200 mL/hr over 30 Minutes Intravenous Every 24 hours 12/09/23 0908 12/14/23 1044         Subjective: Patient seen and examined.  Dizziness is improving.  He was walking in the room when I entered and he was asymptomatic.  Objective: Vitals:   02/05/24 2337 02/06/24 0429 02/06/24  0433 02/06/24 0747  BP: 104/70 113/86 113/86 104/78  Pulse: 80 87 87 (!) 108  Resp: 19 18 18    Temp: 98 F (36.7 C) (!) 97.5 F (36.4 C) (!) 97.5 F (36.4 C) 97.6 F (36.4 C)  TempSrc:    Oral  SpO2: 99%  100% 100%  Weight:      Height:        Intake/Output Summary (Last 24 hours) at 02/06/2024 0832 Last data filed at 02/05/2024 1942 Gross per 24 hour  Intake 500 ml  Output 1150 ml  Net -650 ml   Filed Weights   01/28/24 0500 01/30/24 0400 02/02/24 1628  Weight: 65.9 kg 68.4 kg 65.7 kg    Examination:  General exam: Appears calm and comfortable  Respiratory system: Clear to auscultation. Respiratory effort normal. Cardiovascular system: S1 & S2 heard, RRR. No JVD, murmurs, rubs, gallops or clicks. No pedal edema. Gastrointestinal system: Abdomen is nondistended, soft and nontender. No organomegaly or masses felt. Normal bowel sounds heard. Central nervous system: Alert and oriented. No focal neurological deficits. Extremities: Symmetric 5 x 5 power. Skin: No rashes, lesions or ulcers.    Data Reviewed: I have personally reviewed following labs and  imaging studies  CBC: Recent Labs  Lab 02/02/24 0310  WBC 4.2  HGB 12.7*  HCT 38.5*  MCV 97.5  PLT 227   Basic Metabolic Panel: Recent Labs  Lab 01/31/24 0419 02/01/24 0814 02/02/24 0310  NA 136 136 138  K 4.0 4.1 3.7  CL 105 102 104  CO2 23 23 22   GLUCOSE 92 83 128*  BUN 25* 31* 49*  CREATININE 0.71 0.66 0.85  CALCIUM  9.3 9.5 9.5  MG 1.7 1.6* 2.2   GFR: Estimated Creatinine Clearance: 75.1 mL/min (by C-G formula based on SCr of 0.85 mg/dL). Liver Function Tests: No results for input(s): AST, ALT, ALKPHOS, BILITOT, PROT, ALBUMIN  in the last 168 hours. No results for input(s): LIPASE, AMYLASE in the last 168 hours. No results for input(s): AMMONIA in the last 168 hours. Coagulation Profile: No results for input(s): INR, PROTIME in the last 168 hours. Cardiac Enzymes: No results  for input(s): CKTOTAL, CKMB, CKMBINDEX, TROPONINI in the last 168 hours. BNP (last 3 results) No results for input(s): PROBNP in the last 8760 hours. HbA1C: No results for input(s): HGBA1C in the last 72 hours. CBG: No results for input(s): GLUCAP in the last 168 hours.  Lipid Profile: No results for input(s): CHOL, HDL, LDLCALC, TRIG, CHOLHDL, LDLDIRECT in the last 72 hours. Thyroid Function Tests: No results for input(s): TSH, T4TOTAL, FREET4, T3FREE, THYROIDAB in the last 72 hours. Anemia Panel: No results for input(s): VITAMINB12, FOLATE, FERRITIN, TIBC, IRON , RETICCTPCT in the last 72 hours. Sepsis Labs: No results for input(s): PROCALCITON, LATICACIDVEN in the last 168 hours.  No results found for this or any previous visit (from the past 240 hours).   Radiology Studies: No results found.  Scheduled Meds:  amiodarone   200 mg Oral BID   apixaban   5 mg Oral BID   ascorbic acid   500 mg Oral Daily   famotidine   20 mg Oral BID   feeding supplement  237 mL Oral BID BM   Gerhardt's butt cream   Topical BID   lidocaine   1 patch Transdermal Q24H   magnesium  chloride  2 tablet Oral Daily   midodrine   10 mg Oral TID WC   multivitamin with minerals  1 tablet Oral Daily   nutrition supplement (JUVEN)  1 packet Oral BID BM   urea   15 g Oral BID   Continuous Infusions:   LOS: 60 days   Fredia Skeeter, MD [facility]  02/06/2024, 8:32 AM   *Please note that this is a verbal dictation therefore any spelling or grammatical errors are due to the Dragon Medical One system interpretation.  Please page via Amion and do not message via secure chat for urgent patient care matters. Secure chat can be used for non urgent patient care matters.  How to contact the [facility] Attending or Consulting provider 7A - 7P or covering provider during after hours 7P -7A, for this patient?  Check the care team in [facility] and look for a)  attending/consulting TR[facility] provider listed and b) the [facility] team listed. Page or secure chat 7A-7P. Log into [email or web address] and use [place]'s universal password to access. If you do not have the password, please contact the hospital operator. Locate the [facility] provider you are looking for under [facility] and page to a number that you can be directly reached. If you still have difficulty reaching the provider, please page the [facility] (Director on Call) for the Hospitalists listed on amion for assistance.

## 2024-02-07 DIAGNOSIS — E162 Hypoglycemia, unspecified: Secondary | ICD-10-CM | POA: Diagnosis not present

## 2024-02-07 NOTE — Progress Notes (Signed)
 PROGRESS NOTE    Anthony A Peasley Jr.  FMW:[id number] DOB: [birth year] DOA: 12/07/2023 PCP: Pcp, No   Brief Narrative:  68 y.o. male with PMH significant for homelessness, chronic alcoholism c/b withdrawals/DTs, chronic smoking, HTN, paroxysmal A-fib, h/o PE previously on Eliquis , hiatal hernia s/p Nissen fundoplication 2016, chronic low back pain, chronic anxiety, opiate withdrawal presented to the ED for confusion, low blood sugar.  From hospital stay.  Admitted on 12/07/2023. Seen by critical care, Cardiology, GI, neurology, psychiatry and palliative care.   Assessment & Plan:   Principal Problem:   Hypoglycemia Active Problems:   Paroxysmal atrial fibrillation (HCC)   Hyponatremia   HTN (hypertension)   Macrocytic anemia   History of pulmonary embolus (PE)   Alcohol  withdrawal (HCC)   Alcohol  withdrawal delirium (HCC)   Moderate protein malnutrition (HCC)   Lactic acidosis   Acute delirium   Atrial fibrillation with RVR (HCC)  SIRS  Resolved.  Completed empiric 5-day course of Zosyn .  Remains afebrile and stable   Orthostatic hypotension. Blood pressure dropped significantly on standing up in the beginning. Received IV fluid bolus.  Initiated on midodrine . Due to CHF, not a good candidate for Florinef . Continue with abdominal binder and compression stockings as well. Cosyntropin  stim test negative.  Increased midodrine  to 10 mg 3 times daily on 02/03/2024. Still complains of intermittent dizziness but improving.  Orthostatics are not positive anymore.   Monitor daily orthostatics.    Acute metabolic encephalopathy secondary to alcohol  withdrawal  Currently resolved. Required ICU stay with Precedex .  Monitor.   Retroperitoneal hematoma/chronic iron  deficiency anemia/acute blood loss anemia  S/p 5 units total RBCs during course of hospital stay. Hemoglobin stable.  No active bleeding.  Continue Eliquis    Paroxysmal A-fib with RVR - Initially on Cardizem  drip.  Has  intermittent tachycardia.   Would avoid Cardizem  secondary to low EF. Currently on amiodarone  and Eliquis .   Acute on chronic HFrEF  EF in the past have been around 45%. EF this admission is 35 to 45%. Cardiology was consulted earlier. GDMT is limited by hypotension. Appears to be euvolemic for now.   Hyponatremia  mild, overall stable.  Currently on urea  packet only.   Hypokalemia/hypomagnesemia  Providing IV and oral magnesium  replacement. Maintain K more than 4.   Goals of care - Patient is DNR.  Palliative care was consulted.   Disposition. Patient was homeless, TOC working on dispo discharge disposition. Per psychiatry 6/15 the patient lacks capacity for decision-making. However, pt is now very conversive, oriented, able to make his own decisions.   APS involved however now stating they cannot pursue guardianship.  Appears patient does not want to pursue ALF and stated he would walk out if placed there. Is willing to go back to the community either boarding house or similar. Working closely with TOC on disposition planning aligning with the patient's goals.  Per TOC, group home is going to talk to him and evaluate him for admission.   Moderate protein calorie malnutrition. Continue supplementation.   Reported diarrhea. Constipated. Patient reports that he has multiple diarrhea although RN is not able to corroborate with me about that history.  Diarrhea will explain hypokalemia and hypomagnesemia though.  For now monitor. Will discontinue bowel regimen.   DVT prophylaxis: Place and maintain sequential compression device Start: 01/30/24 1209 Place TED hose Start: 01/30/24 1209   Code Status: Limited: Do not attempt resuscitation (DNR) -DNR-LIMITED -Do Not Intubate/DNI   Family Communication:  None present at bedside.  Status is: Inpatient Remains inpatient appropriate because: Difficult to place.  Medically stable for long time.   Estimated body mass index is 23.39  kg/m as calculated from the following:   Height as of this encounter: 5' 6 (1.676 m).   Weight as of this encounter: 65.7 kg.    Nutritional Assessment: Body mass index is 23.39 kg/m.SABRA Seen by dietician.  I agree with the assessment and plan as outlined below: Nutrition Status: Nutrition Problem: Moderate Malnutrition Etiology: social / environmental circumstances (ETOH use, homeless) Signs/Symptoms: moderate fat depletion, moderate muscle depletion Interventions: Refer to RD note for recommendations  . Skin Assessment: I have examined the patient's skin and I agree with the wound assessment as performed by the wound care RN as outlined below:    Consultants:    Procedures:    Antimicrobials:  Anti-infectives (From admission, onward)    Start     Dose/Rate Route Frequency Ordered Stop   01/03/24 0000  vancomycin  (VANCOREADY) IVPB 750 mg/150 mL  Status:  Discontinued        750 mg 150 mL/hr over 60 Minutes Intravenous Every 12 hours 01/02/24 1001 01/04/24 1135   01/02/24 1100  vancomycin  (VANCOREADY) IVPB 1500 mg/300 mL        1,500 mg 150 mL/hr over 120 Minutes Intravenous  Once 01/02/24 1000 01/02/24 1325   01/02/24 0400  piperacillin -tazobactam (ZOSYN ) IVPB 3.375 g  Status:  Discontinued        3.375 g 12.5 mL/hr over 240 Minutes Intravenous Every 8 hours 01/02/24 0231 01/06/24 1358   12/09/23 1100  metroNIDAZOLE  (FLAGYL ) IVPB 500 mg  Status:  Discontinued        500 mg 100 mL/hr over 60 Minutes Intravenous Every 12 hours 12/09/23 1004 12/14/23 1044   12/09/23 1000  cefTRIAXone  (ROCEPHIN ) 2 g in sodium chloride  0.9 % 100 mL IVPB  Status:  Discontinued        2 g 200 mL/hr over 30 Minutes Intravenous Every 24 hours 12/09/23 0908 12/14/23 1044         Subjective: Seen and examined.  No complaints today.  Objective: Vitals:   02/06/24 1654 02/06/24 2210 02/07/24 0423 02/07/24 0752  BP: (!) 126/93 120/88 (!) 124/93 (!) 124/90  Pulse: 81 92 86 95  Resp:   17    Temp: 98.5 F (36.9 C) 98.2 F (36.8 C) 98.5 F (36.9 C) (!) 97.5 F (36.4 C)  TempSrc: Oral Oral Oral Oral  SpO2: 100%  100% 100%  Weight:      Height:        Intake/Output Summary (Last 24 hours) at 02/07/2024 1100 Last data filed at 02/07/2024 0649 Gross per 24 hour  Intake --  Output 2400 ml  Net -2400 ml   Filed Weights   01/28/24 0500 01/30/24 0400 02/02/24 1628  Weight: 65.9 kg 68.4 kg 65.7 kg    Examination:  General exam: Appears calm and comfortable  Respiratory system: Clear to auscultation. Respiratory effort normal. Cardiovascular system: S1 & S2 heard, RRR. No JVD, murmurs, rubs, gallops or clicks. No pedal edema. Gastrointestinal system: Abdomen is nondistended, soft and nontender. No organomegaly or masses felt. Normal bowel sounds heard. Central nervous system: Alert and oriented. No focal neurological deficits. Extremities: Symmetric 5 x 5 power. Skin: No rashes, lesions or ulcers.    Data Reviewed: I have personally reviewed following labs and imaging studies  CBC: Recent Labs  Lab 02/02/24 0310  WBC 4.2  HGB 12.7*  HCT 38.5*  MCV  97.5  PLT 227   Basic Metabolic Panel: Recent Labs  Lab 02/01/24 0814 02/02/24 0310  NA 136 138  K 4.1 3.7  CL 102 104  CO2 23 22  GLUCOSE 83 128*  BUN 31* 49*  CREATININE 0.66 0.85  CALCIUM  9.5 9.5  MG 1.6* 2.2   GFR: Estimated Creatinine Clearance: 75.1 mL/min (by C-G formula based on SCr of 0.85 mg/dL). Liver Function Tests: No results for input(s): AST, ALT, ALKPHOS, BILITOT, PROT, ALBUMIN  in the last 168 hours. No results for input(s): LIPASE, AMYLASE in the last 168 hours. No results for input(s): AMMONIA in the last 168 hours. Coagulation Profile: No results for input(s): INR, PROTIME in the last 168 hours. Cardiac Enzymes: No results for input(s): CKTOTAL, CKMB, CKMBINDEX, TROPONINI in the last 168 hours. BNP (last 3 results) No results for input(s): PROBNP in  the last 8760 hours. HbA1C: No results for input(s): HGBA1C in the last 72 hours. CBG: No results for input(s): GLUCAP in the last 168 hours.  Lipid Profile: No results for input(s): CHOL, HDL, LDLCALC, TRIG, CHOLHDL, LDLDIRECT in the last 72 hours. Thyroid Function Tests: No results for input(s): TSH, T4TOTAL, FREET4, T3FREE, THYROIDAB in the last 72 hours. Anemia Panel: No results for input(s): VITAMINB12, FOLATE, FERRITIN, TIBC, IRON , RETICCTPCT in the last 72 hours. Sepsis Labs: No results for input(s): PROCALCITON, LATICACIDVEN in the last 168 hours.  No results found for this or any previous visit (from the past 240 hours).   Radiology Studies: No results found.  Scheduled Meds:  amiodarone   200 mg Oral BID   apixaban   5 mg Oral BID   ascorbic acid   500 mg Oral Daily   famotidine   20 mg Oral BID   feeding supplement  237 mL Oral BID BM   Gerhardt's butt cream   Topical BID   lidocaine   1 patch Transdermal Q24H   magnesium  chloride  2 tablet Oral Daily   midodrine   10 mg Oral TID WC   multivitamin with minerals  1 tablet Oral Daily   nutrition supplement (JUVEN)  1 packet Oral BID BM   urea   15 g Oral BID   Continuous Infusions:   LOS: 61 days   Fredia Skeeter, MD [facility]  02/07/2024, 11:00 AM   *Please note that this is a verbal dictation therefore any spelling or grammatical errors are due to the Dragon Medical One system interpretation.  Please page via Amion and do not message via secure chat for urgent patient care matters. Secure chat can be used for non urgent patient care matters.  How to contact the [facility] Attending or Consulting provider 7A - 7P or covering provider during after hours 7P -7A, for this patient?  Check the care team in [facility] and look for a) attending/consulting TR[facility] provider listed and b) the TR[facility] team listed. Page or secure chat 7A-7P. Log into [email or web address] and use [place]'s universal  password to access. If you do not have the password, please contact the hospital operator. Locate the [facility] provider you are looking for under [facility] and page to a number that you can be directly reached. If you still have difficulty reaching the provider, please page the [facility] (Director on Call) for the Hospitalists listed on amion for assistance.

## 2024-02-07 NOTE — Progress Notes (Signed)
   02/07/24 0913  Mobility  Activity Ambulated independently in hallway  Level of Assistance Standby assist, set-up cues, supervision of patient - no hands on  Assistive Device None  Distance Ambulated (ft) 600 ft  Activity Response Tolerated fair  Mobility Referral Yes  Mobility visit 1 Mobility  Mobility Specialist Start Time (ACUTE ONLY) 0913  Mobility Specialist Stop Time (ACUTE ONLY) [id number]  Mobility Specialist Time Calculation (min) (ACUTE ONLY) 15 min   Mobility Specialist: Progress Note  Pt agreeable to mobility session - received in bed. C/o lower back pain rated 7/10. Pt unhappy about his stay at the hospital. Returned to bed with all needs met - call bell within reach.   Virgle Boards, BS Mobility Specialist Please contact via SecureChat or  Rehab office at [phone].

## 2024-02-07 NOTE — Plan of Care (Signed)

## 2024-02-08 DIAGNOSIS — E162 Hypoglycemia, unspecified: Secondary | ICD-10-CM | POA: Diagnosis not present

## 2024-02-08 NOTE — Plan of Care (Signed)
   Problem: Activity: Goal: Risk for activity intolerance will decrease Outcome: Progressing   Problem: Nutrition: Goal: Adequate nutrition will be maintained Outcome: Progressing   Problem: Coping: Goal: Level of anxiety will decrease Outcome: Progressing   Problem: Elimination: Goal: Will not experience complications related to bowel motility Outcome: Progressing Goal: Will not experience complications related to urinary retention Outcome: Progressing

## 2024-02-08 NOTE — Plan of Care (Signed)
   Problem: Education: Goal: Knowledge of General Education information will improve Description Including pain rating scale, medication(s)/side effects and non-pharmacologic comfort measures Outcome: Progressing   Problem: Health Behavior/Discharge Planning: Goal: Ability to manage health-related needs will improve Outcome: Progressing

## 2024-02-08 NOTE — Progress Notes (Signed)
 PROGRESS NOTE    Anthony A Devilla Jr.  FMW:[id number] DOB: 1956/06/25 DOA: 12/07/2023 PCP: Pcp, No   Brief Narrative:  68 y.o. male with PMH significant for homelessness, chronic alcoholism c/b withdrawals/DTs, chronic smoking, HTN, paroxysmal A-fib, h/o PE previously on Eliquis , hiatal hernia s/p Nissen fundoplication 2016, chronic low back pain, chronic anxiety, opiate withdrawal presented to the ED for confusion, low blood sugar.  From hospital stay.  Admitted on 12/07/2023. Seen by critical care, Cardiology, GI, neurology, psychiatry and palliative care.   Assessment & Plan:   Principal Problem:   Hypoglycemia Active Problems:   Paroxysmal atrial fibrillation (HCC)   Hyponatremia   HTN (hypertension)   Macrocytic anemia   History of pulmonary embolus (PE)   Alcohol  withdrawal (HCC)   Alcohol  withdrawal delirium (HCC)   Moderate protein malnutrition (HCC)   Lactic acidosis   Acute delirium   Atrial fibrillation with RVR (HCC)  SIRS  Resolved.  Completed empiric 5-day course of Zosyn .  Remains afebrile and stable   Orthostatic hypotension. Blood pressure dropped significantly on standing up in the beginning. Received IV fluid bolus.  Initiated on midodrine . Due to CHF, not a good candidate for Florinef . Continue with abdominal binder and compression stockings as well. Cosyntropin  stim test negative.  Increased midodrine  to 10 mg 3 times daily on 02/03/2024.  No more dizziness now, orthostatics are not positive anymore.   Monitor daily orthostatics.    Acute metabolic encephalopathy secondary to alcohol  withdrawal  Currently resolved. Required ICU stay with Precedex .  Monitor.   Retroperitoneal hematoma/chronic iron  deficiency anemia/acute blood loss anemia  S/p 5 units total RBCs during course of hospital stay. Hemoglobin stable.  No active bleeding.  Continue Eliquis    Paroxysmal A-fib with RVR - Initially on Cardizem  drip.  Has intermittent tachycardia.   Would avoid  Cardizem  secondary to low EF. Currently on amiodarone  and Eliquis .   Acute on chronic HFrEF  EF in the past have been around 45%. EF this admission is 35 to 45%. Cardiology was consulted earlier. GDMT is limited by hypotension. Appears to be euvolemic for now.   Hyponatremia  mild, overall stable.  Currently on urea  packet only.   Hypokalemia/hypomagnesemia  Providing IV and oral magnesium  replacement. Maintain K more than 4.   Goals of care - Patient is DNR.  Palliative care was consulted.   Disposition. Patient was homeless, TOC working on dispo discharge disposition. Per psychiatry 6/15 the patient lacks capacity for decision-making. However, pt is now very conversive, oriented, able to make his own decisions.   APS involved however now stating they cannot pursue guardianship.  Appears patient does not want to pursue ALF and stated he would walk out if placed there. Is willing to go back to the community either boarding house or similar. Working closely with TOC on disposition planning aligning with the patient's goals.  Per TOC, group home is going to talk to him and evaluate him for admission.   Moderate protein calorie malnutrition. Continue supplementation.   Reported diarrhea. Constipated. Patient reports that he has multiple diarrhea although RN is not able to corroborate with me about that history.  Diarrhea will explain hypokalemia and hypomagnesemia though.  For now monitor. Will discontinue bowel regimen.   DVT prophylaxis: Place and maintain sequential compression device Start: 01/30/24 1209 Place TED hose Start: 01/30/24 1209   Code Status: Limited: Do not attempt resuscitation (DNR) -DNR-LIMITED -Do Not Intubate/DNI   Family Communication:  None present at bedside.    Status  is: Inpatient Remains inpatient appropriate because: Difficult to place.  Medically stable for long time.   Estimated body mass index is 23.39 kg/m as calculated from the following:    Height as of this encounter: 5' 6 (1.676 m).   Weight as of this encounter: 65.7 kg.    Nutritional Assessment: Body mass index is 23.39 kg/m.SABRA Seen by dietician.  I agree with the assessment and plan as outlined below: Nutrition Status: Nutrition Problem: Moderate Malnutrition Etiology: social / environmental circumstances (ETOH use, homeless) Signs/Symptoms: moderate fat depletion, moderate muscle depletion Interventions: Refer to RD note for recommendations  . Skin Assessment: I have examined the patient's skin and I agree with the wound assessment as performed by the wound care RN as outlined below:    Consultants:    Procedures:    Antimicrobials:  Anti-infectives (From admission, onward)    Start     Dose/Rate Route Frequency Ordered Stop   01/03/24 0000  vancomycin  (VANCOREADY) IVPB 750 mg/150 mL  Status:  Discontinued        750 mg 150 mL/hr over 60 Minutes Intravenous Every 12 hours 01/02/24 1001 01/04/24 1135   01/02/24 1100  vancomycin  (VANCOREADY) IVPB 1500 mg/300 mL        1,500 mg 150 mL/hr over 120 Minutes Intravenous  Once 01/02/24 1000 01/02/24 1325   01/02/24 0400  piperacillin -tazobactam (ZOSYN ) IVPB 3.375 g  Status:  Discontinued        3.375 g 12.5 mL/hr over 240 Minutes Intravenous Every 8 hours 01/02/24 0231 01/06/24 1358   12/09/23 1100  metroNIDAZOLE  (FLAGYL ) IVPB 500 mg  Status:  Discontinued        500 mg 100 mL/hr over 60 Minutes Intravenous Every 12 hours 12/09/23 1004 12/14/23 1044   12/09/23 1000  cefTRIAXone  (ROCEPHIN ) 2 g in sodium chloride  0.9 % 100 mL IVPB  Status:  Discontinued        2 g 200 mL/hr over 30 Minutes Intravenous Every 24 hours 12/09/23 0908 12/14/23 1044         Subjective: And examined, walking in the room, no complaints.  He would like to have permission to go off the unit so he can have fresh air.  Nurses are aware and have been instructed to allow him to go out as long as he is supervised by hospital  staff.  Objective: Vitals:   02/07/24 2016 02/08/24 0036 02/08/24 0357 02/08/24 0833  BP: (!) 130/95 111/84 126/85 111/78  Pulse: 80 89 83 (!) 110  Resp: 19 17  17   Temp: 98 F (36.7 C) 97.6 F (36.4 C) 97.6 F (36.4 C) (!) 97.5 F (36.4 C)  TempSrc: Oral     SpO2: 99% 98% 97% 100%  Weight:      Height:        Intake/Output Summary (Last 24 hours) at 02/08/2024 1152 Last data filed at 02/07/2024 2100 Gross per 24 hour  Intake 360 ml  Output 1500 ml  Net -1140 ml   Filed Weights   01/28/24 0500 01/30/24 0400 02/02/24 1628  Weight: 65.9 kg 68.4 kg 65.7 kg    Examination:  General exam: Appears calm and comfortable  Respiratory system: Clear to auscultation. Respiratory effort normal. Cardiovascular system: S1 & S2 heard, RRR. No JVD, murmurs, rubs, gallops or clicks. No pedal edema. Gastrointestinal system: Abdomen is nondistended, soft and nontender. No organomegaly or masses felt. Normal bowel sounds heard. Central nervous system: Alert and oriented. No focal neurological deficits. Extremities: Symmetric 5 x  5 power. Skin: No rashes, lesions or ulcers.    Data Reviewed: I have personally reviewed following labs and imaging studies  CBC: Recent Labs  Lab 02/02/24 0310  WBC 4.2  HGB 12.7*  HCT 38.5*  MCV 97.5  PLT 227   Basic Metabolic Panel: Recent Labs  Lab 02/02/24 0310  NA 138  K 3.7  CL 104  CO2 22  GLUCOSE 128*  BUN 49*  CREATININE 0.85  CALCIUM  9.5  MG 2.2   GFR: Estimated Creatinine Clearance: 75.1 mL/min (by C-G formula based on SCr of 0.85 mg/dL). Liver Function Tests: No results for input(s): AST, ALT, ALKPHOS, BILITOT, PROT, ALBUMIN  in the last 168 hours. No results for input(s): LIPASE, AMYLASE in the last 168 hours. No results for input(s): AMMONIA in the last 168 hours. Coagulation Profile: No results for input(s): INR, PROTIME in the last 168 hours. Cardiac Enzymes: No results for input(s): CKTOTAL,  CKMB, CKMBINDEX, TROPONINI in the last 168 hours. BNP (last 3 results) No results for input(s): PROBNP in the last 8760 hours. HbA1C: No results for input(s): HGBA1C in the last 72 hours. CBG: No results for input(s): GLUCAP in the last 168 hours.  Lipid Profile: No results for input(s): CHOL, HDL, LDLCALC, TRIG, CHOLHDL, LDLDIRECT in the last 72 hours. Thyroid Function Tests: No results for input(s): TSH, T4TOTAL, FREET4, T3FREE, THYROIDAB in the last 72 hours. Anemia Panel: No results for input(s): VITAMINB12, FOLATE, FERRITIN, TIBC, IRON , RETICCTPCT in the last 72 hours. Sepsis Labs: No results for input(s): PROCALCITON, LATICACIDVEN in the last 168 hours.  No results found for this or any previous visit (from the past 240 hours).   Radiology Studies: No results found.  Scheduled Meds:  amiodarone   200 mg Oral BID   apixaban   5 mg Oral BID   ascorbic acid   500 mg Oral Daily   famotidine   20 mg Oral BID   feeding supplement  237 mL Oral BID BM   Gerhardt's butt cream   Topical BID   lidocaine   1 patch Transdermal Q24H   magnesium  chloride  2 tablet Oral Daily   midodrine   10 mg Oral TID WC   multivitamin with minerals  1 tablet Oral Daily   nutrition supplement (JUVEN)  1 packet Oral BID BM   urea   15 g Oral BID   Continuous Infusions:   LOS: 62 days   Fredia Skeeter, MD [facility]  02/08/2024, 11:52 AM   *Please note that this is a verbal dictation therefore any spelling or grammatical errors are due to the Dragon Medical One system interpretation.  Please page via Amion and do not message via secure chat for urgent patient care matters. Secure chat can be used for non urgent patient care matters.  How to contact the [facility] Attending or Consulting provider 7A - 7P or covering provider during after hours 7P -7A, for this patient?  Check the care team in [facility] and look for a) attending/consulting TR[facility] provider  listed and b) the [facility] team listed. Page or secure chat 7A-7P. Log into [email or web address] and use [place]'s universal password to access. If you do not have the password, please contact the hospital operator. Locate the [facility] provider you are looking for under [facility] and page to a number that you can be directly reached. If you still have difficulty reaching the provider, please page the [facility] (Director on Call) for the Hospitalists listed on amion for assistance.

## 2024-02-08 NOTE — Progress Notes (Signed)
 Occupational Therapy Treatment Patient Details Name: Anthony A Poznanski Jr. MRN: [id number] DOB: 09-13-1955 Today's Date: 02/08/2024   History of present illness Pt is a 68 y.o. male who presented 12/06/23 with SOB. Shortly after arriving, pt became agitated and was given IV Ativan . CT head is unremarkable. Patient admitted for EtOH withdrawal, acute delirium, and hypoglycemia. Cortrak placed 5/23 now discontinued.  The day before the patient had presented to ED requesting EtOH detox; patient was deemed to be not actively in EtOH withdrawal and was discharged, however upon discharge patient threatened staff and was escorted off of the premises by [employer]. PMH includes alcohol  abuse, HTN, CVA, HFmrEF, afib, PE, HLD, anxiety, depression, chronic low back pain.   OT comments  Patient received in supine and asking to go outside. Patient able to donn shoes without assistance and performed mobility in hallway and outside with no assistive device with no LOB and only one seated rest break. Patient returned to room and performed toileting and grooming without assistance. Discharge recommendations continue to be appropriate. Acute OT to continue to follow to address established goals to facilitate DC to next venue of care.        If plan is discharge home, recommend the following:  Assist for transportation;Direct supervision/assist for medications management;A Callow help with walking and/or transfers;A Kepner help with bathing/dressing/bathroom;Supervision due to cognitive status   Equipment Recommendations  Other (comment) (defer)    Recommendations for Other Services      Precautions / Restrictions Precautions Precautions: Fall Recall of Precautions/Restrictions: Impaired Precaution/Restrictions Comments: watch BP Restrictions Weight Bearing Restrictions Per Provider Order: No       Mobility Bed Mobility Overal bed mobility: Modified Independent             General bed mobility comments: able  to get OOB without assistance and remained OOB at end of session    Transfers Overall transfer level: Modified independent Equipment used: None               General transfer comment: patient ambulates in room without assistance, performed mobility in hallway and outside with no LOB     Balance Overall balance assessment: Mild deficits observed, not formally tested Sitting-balance support: No upper extremity supported, Feet supported Sitting balance-Leahy Scale: Good     Standing balance support: No upper extremity supported, Bilateral upper extremity supported, During functional activity Standing balance-Leahy Scale: Fair Standing balance comment: mobility on different surfaces without LOB                           ADL either performed or assessed with clinical judgement   ADL Overall ADL's : Needs assistance/impaired     Grooming: Modified independent;Standing               Lower Body Dressing: Modified independent;Sit to/from stand Lower Body Dressing Details (indicate cue type and reason): donned shoes without assistance Toilet Transfer: Modified Independent;Ambulation Toilet Transfer Details (indicate cue type and reason): took self to bathroom during visit without RW or assistance         Functional mobility during ADLs: Modified independent      Extremity/Trunk Assessment              Vision       Perception     Praxis     Communication Communication Communication: Impaired Factors Affecting Communication: Hearing impaired (mildly)   Cognition Arousal: Alert Behavior During Therapy: WFL for tasks assessed/performed Cognition: Cognition  impaired     Awareness: Online awareness intact, Intellectual awareness intact       OT - Cognition Comments: able to answer orientation questions correctly                 Following commands: Impaired Following commands impaired: Follows multi-step commands with increased time       Cueing   Cueing Techniques: Verbal cues  Exercises      Shoulder Instructions       General Comments      Pertinent Vitals/ Pain       Pain Assessment Pain Assessment: Faces Faces Pain Scale: Hurts Stalvey more Pain Location: back (chronic) Pain Descriptors / Indicators: Discomfort, Sore Pain Intervention(s): Monitored during session, Repositioned  Home Living                                          Prior Functioning/Environment              Frequency  Min 2X/week        Progress Toward Goals  OT Goals(current goals can now be found in the care plan section)  Progress towards OT goals: Progressing toward goals  Acute Rehab OT Goals Patient Stated Goal: to discharge from hospital OT Goal Formulation: With patient Time For Goal Achievement: 02/16/24 Potential to Achieve Goals: Good ADL Goals Pt Will Perform Grooming: with modified independence;standing Pt Will Perform Lower Body Dressing: with modified independence;sit to/from stand Pt Will Transfer to Toilet: regular height toilet;ambulating;with modified independence Additional ADL Goal #1: pt will perform pillbox assessment mod I for increased time. Additional ADL Goal #2: Pt will complete a multistep ADL/iADL independently  Plan      Co-evaluation                 AM-PAC OT 6 Clicks Daily Activity     Outcome Measure   Help from another person eating meals?: None Help from another person taking care of personal grooming?: None Help from another person toileting, which includes using toliet, bedpan, or urinal?: None Help from another person bathing (including washing, rinsing, drying)?: None Help from another person to put on and taking off regular upper body clothing?: None Help from another person to put on and taking off regular lower body clothing?: A Brecheen 6 Click Score: 23    End of Session Equipment Utilized During Treatment: Gait belt  OT Visit Diagnosis:  Other symptoms and signs involving cognitive function;Unsteadiness on feet (R26.81);Low vision, both eyes (H54.2)   Activity Tolerance Patient tolerated treatment well   Patient Left in chair;with call bell/phone within reach   Nurse Communication Mobility status        Time: 8886-8864 OT Time Calculation (min): 22 min  Charges: OT General Charges $OT Visit: 1 Visit OT Treatments $Therapeutic Activity: 8-22 mins  Dick [name], OTA Acute Rehabilitation Services  Office [phone]   Anthony [name] 02/08/2024, 12:45 PM

## 2024-02-09 DIAGNOSIS — E162 Hypoglycemia, unspecified: Secondary | ICD-10-CM | POA: Diagnosis not present

## 2024-02-09 LAB — CBC WITH DIFFERENTIAL/PLATELET
Abs Immature Granulocytes: 0.01 K/uL (ref 0.00–0.07)
Basophils Absolute: 0.1 K/uL (ref 0.0–0.1)
Basophils Relative: 1 %
Eosinophils Absolute: 0.3 K/uL (ref 0.0–0.5)
Eosinophils Relative: 5 %
HCT: 36.5 % — ABNORMAL LOW (ref 39.0–52.0)
Hemoglobin: 12 g/dL — ABNORMAL LOW (ref 13.0–17.0)
Immature Granulocytes: 0 %
Lymphocytes Relative: 46 %
Lymphs Abs: 2.7 K/uL (ref 0.7–4.0)
MCH: 32.5 pg (ref 26.0–34.0)
MCHC: 32.9 g/dL (ref 30.0–36.0)
MCV: 98.9 fL (ref 80.0–100.0)
Monocytes Absolute: 0.9 K/uL (ref 0.1–1.0)
Monocytes Relative: 15 %
Neutro Abs: 2 K/uL (ref 1.7–7.7)
Neutrophils Relative %: 33 %
Platelets: 203 K/uL (ref 150–400)
RBC: 3.69 MIL/uL — ABNORMAL LOW (ref 4.22–5.81)
RDW: 17.9 % — ABNORMAL HIGH (ref 11.5–15.5)
WBC: 5.9 K/uL (ref 4.0–10.5)
nRBC: 0 % (ref 0.0–0.2)

## 2024-02-09 LAB — BASIC METABOLIC PANEL WITH GFR
Anion gap: 9 (ref 5–15)
BUN: 42 mg/dL — ABNORMAL HIGH (ref 8–23)
CO2: 22 mmol/L (ref 22–32)
Calcium: 9.6 mg/dL (ref 8.9–10.3)
Chloride: 107 mmol/L (ref 98–111)
Creatinine, Ser: 0.78 mg/dL (ref 0.61–1.24)
GFR, Estimated: >60 mL/min (ref >60)
Glucose, Bld: 96 mg/dL (ref 70–99)
Potassium: 3.9 mmol/L (ref 3.5–5.1)
Sodium: 138 mmol/L (ref 135–145)

## 2024-02-09 NOTE — Progress Notes (Signed)
 PROGRESS NOTE    Anthony A Hackel Jr.  FMW:[id number] DOB: [birth year] DOA: 12/07/2023 PCP: Pcp, No   Brief Narrative:  68 y.o. male with PMH significant for homelessness, chronic alcoholism c/b withdrawals/DTs, chronic smoking, HTN, paroxysmal A-fib, h/o PE previously on Eliquis , hiatal hernia s/p Nissen fundoplication 2016, chronic low back pain, chronic anxiety, opiate withdrawal presented to the ED for confusion, low blood sugar.  From hospital stay.  Admitted on 12/07/2023. Seen by critical care, Cardiology, GI, neurology, psychiatry and palliative care.   Assessment & Plan:   Principal Problem:   Hypoglycemia Active Problems:   Paroxysmal atrial fibrillation (HCC)   Hyponatremia   HTN (hypertension)   Macrocytic anemia   History of pulmonary embolus (PE)   Alcohol  withdrawal (HCC)   Alcohol  withdrawal delirium (HCC)   Moderate protein malnutrition (HCC)   Lactic acidosis   Acute delirium   Atrial fibrillation with RVR (HCC)  SIRS  Resolved.  Completed empiric 5-day course of Zosyn .  Remains afebrile and stable   Orthostatic hypotension. Blood pressure dropped significantly on standing up in the beginning. Received IV fluid bolus.  Initiated on midodrine . Due to CHF, not a good candidate for Florinef . Continue with abdominal binder and compression stockings as well. Cosyntropin  stim test negative.  Increased midodrine  to 10 mg 3 times daily on 02/03/2024.  No more dizziness now, orthostatics are not positive anymore.   Monitor daily orthostatics.    Acute metabolic encephalopathy secondary to alcohol  withdrawal  Currently resolved. Required ICU stay with Precedex .  Monitor.   Retroperitoneal hematoma/chronic iron  deficiency anemia/acute blood loss anemia  S/p 5 units total RBCs during course of hospital stay. Hemoglobin stable.  No active bleeding.  Continue Eliquis    Paroxysmal A-fib with RVR - Initially on Cardizem  drip.  Has intermittent tachycardia.   Would avoid  Cardizem  secondary to low EF. Currently on amiodarone  and Eliquis .   Acute on chronic HFrEF  EF in the past have been around 45%. EF this admission is 35 to 45%. Cardiology was consulted earlier. GDMT is limited by hypotension. Appears to be euvolemic for now.   Hyponatremia  mild, overall stable.  Currently on urea  packet only.   Hypokalemia/hypomagnesemia  Providing IV and oral magnesium  replacement. Maintain K more than 4.   Goals of care - Patient is DNR.  Palliative care was consulted.   Disposition. Patient was homeless, TOC working on dispo discharge disposition. Per psychiatry 6/15 the patient lacks capacity for decision-making. However, pt is now very conversive, oriented, able to make his own decisions.   APS involved however now stating they cannot pursue guardianship.  Appears patient does not want to pursue ALF and stated he would walk out if placed there. Is willing to go back to the community either boarding house or similar. Working closely with TOC on disposition planning aligning with the patient's goals.  Per TOC, group home is going to talk to him and evaluate him for admission.   Moderate protein calorie malnutrition. Continue supplementation.   Reported diarrhea. Constipated. Patient reports that he has multiple diarrhea although RN is not able to corroborate with me about that history.  Diarrhea will explain hypokalemia and hypomagnesemia though.  For now monitor. Will discontinue bowel regimen.   DVT prophylaxis: Place and maintain sequential compression device Start: 01/30/24 1209 Place TED hose Start: 01/30/24 1209   Code Status: Limited: Do not attempt resuscitation (DNR) -DNR-LIMITED -Do Not Intubate/DNI   Family Communication:  None present at bedside.    Status  is: Inpatient Remains inpatient appropriate because: Difficult to place.  Medically stable for long time.   Estimated body mass index is 23.39 kg/m as calculated from the following:    Height as of this encounter: 5' 6 (1.676 m).   Weight as of this encounter: 65.7 kg.    Nutritional Assessment: Body mass index is 23.39 kg/m.SABRA Seen by dietician.  I agree with the assessment and plan as outlined below: Nutrition Status: Nutrition Problem: Moderate Malnutrition Etiology: social / environmental circumstances (ETOH use, homeless) Signs/Symptoms: moderate fat depletion, moderate muscle depletion Interventions: Refer to RD note for recommendations  . Skin Assessment: I have examined the patient's skin and I agree with the wound assessment as performed by the wound care RN as outlined below:    Consultants:    Procedures:    Antimicrobials:  Anti-infectives (From admission, onward)    Start     Dose/Rate Route Frequency Ordered Stop   01/03/24 0000  vancomycin  (VANCOREADY) IVPB 750 mg/150 mL  Status:  Discontinued        750 mg 150 mL/hr over 60 Minutes Intravenous Every 12 hours 01/02/24 1001 01/04/24 1135   01/02/24 1100  vancomycin  (VANCOREADY) IVPB 1500 mg/300 mL        1,500 mg 150 mL/hr over 120 Minutes Intravenous  Once 01/02/24 1000 01/02/24 1325   01/02/24 0400  piperacillin -tazobactam (ZOSYN ) IVPB 3.375 g  Status:  Discontinued        3.375 g 12.5 mL/hr over 240 Minutes Intravenous Every 8 hours 01/02/24 0231 01/06/24 1358   12/09/23 1100  metroNIDAZOLE  (FLAGYL ) IVPB 500 mg  Status:  Discontinued        500 mg 100 mL/hr over 60 Minutes Intravenous Every 12 hours 12/09/23 1004 12/14/23 1044   12/09/23 1000  cefTRIAXone  (ROCEPHIN ) 2 g in sodium chloride  0.9 % 100 mL IVPB  Status:  Discontinued        2 g 200 mL/hr over 30 Minutes Intravenous Every 24 hours 12/09/23 0908 12/14/23 1044         Subjective: Seen and examined, no complaints.  Dizziness is improving.  Objective: Vitals:   02/08/24 1734 02/08/24 2105 02/09/24 0810 02/09/24 1155  BP: 122/84 109/77 106/89 111/85  Pulse: 91 99 91 90  Resp: 17 18 16 18   Temp: (!) 97.4 F (36.3 C)  98.9 F (37.2 C) (!) 97.2 F (36.2 C) 97.6 F (36.4 C)  TempSrc:      SpO2: 98% 100% 100% 92%  Weight:      Height:        Intake/Output Summary (Last 24 hours) at 02/09/2024 1258 Last data filed at 02/09/2024 1213 Gross per 24 hour  Intake --  Output 1800 ml  Net -1800 ml   Filed Weights   01/28/24 0500 01/30/24 0400 02/02/24 1628  Weight: 65.9 kg 68.4 kg 65.7 kg    Examination:  General exam: Appears calm and comfortable  Respiratory system: Clear to auscultation. Respiratory effort normal. Cardiovascular system: S1 & S2 heard, RRR. No JVD, murmurs, rubs, gallops or clicks. No pedal edema. Gastrointestinal system: Abdomen is nondistended, soft and nontender. No organomegaly or masses felt. Normal bowel sounds heard. Central nervous system: Alert and oriented. No focal neurological deficits. Extremities: Symmetric 5 x 5 power. Skin: No rashes, lesions or ulcers.    Data Reviewed: I have personally reviewed following labs and imaging studies  CBC: Recent Labs  Lab 02/09/24 0232  WBC 5.9  NEUTROABS 2.0  HGB 12.0*  HCT 36.5*  MCV 98.9  PLT 203   Basic Metabolic Panel: Recent Labs  Lab 02/09/24 0232  NA 138  K 3.9  CL 107  CO2 22  GLUCOSE 96  BUN 42*  CREATININE 0.78  CALCIUM  9.6   GFR: Estimated Creatinine Clearance: 79.8 mL/min (by C-G formula based on SCr of 0.78 mg/dL). Liver Function Tests: No results for input(s): AST, ALT, ALKPHOS, BILITOT, PROT, ALBUMIN  in the last 168 hours. No results for input(s): LIPASE, AMYLASE in the last 168 hours. No results for input(s): AMMONIA in the last 168 hours. Coagulation Profile: No results for input(s): INR, PROTIME in the last 168 hours. Cardiac Enzymes: No results for input(s): CKTOTAL, CKMB, CKMBINDEX, TROPONINI in the last 168 hours. BNP (last 3 results) No results for input(s): PROBNP in the last 8760 hours. HbA1C: No results for input(s): HGBA1C in the last 72  hours. CBG: No results for input(s): GLUCAP in the last 168 hours.  Lipid Profile: No results for input(s): CHOL, HDL, LDLCALC, TRIG, CHOLHDL, LDLDIRECT in the last 72 hours. Thyroid Function Tests: No results for input(s): TSH, T4TOTAL, FREET4, T3FREE, THYROIDAB in the last 72 hours. Anemia Panel: No results for input(s): VITAMINB12, FOLATE, FERRITIN, TIBC, IRON , RETICCTPCT in the last 72 hours. Sepsis Labs: No results for input(s): PROCALCITON, LATICACIDVEN in the last 168 hours.  No results found for this or any previous visit (from the past 240 hours).   Radiology Studies: No results found.  Scheduled Meds:  amiodarone   200 mg Oral BID   apixaban   5 mg Oral BID   famotidine   20 mg Oral BID   feeding supplement  237 mL Oral BID BM   Gerhardt's butt cream   Topical BID   lidocaine   1 patch Transdermal Q24H   magnesium  chloride  2 tablet Oral Daily   midodrine   10 mg Oral TID WC   multivitamin with minerals  1 tablet Oral Daily   nutrition supplement (JUVEN)  1 packet Oral BID BM   urea   15 g Oral BID   Continuous Infusions:   LOS: 63 days   Fredia Skeeter, MD [facility]  02/09/2024, 12:58 PM   *Please note that this is a verbal dictation therefore any spelling or grammatical errors are due to the Dragon Medical One system interpretation.  Please page via Amion and do not message via secure chat for urgent patient care matters. Secure chat can be used for non urgent patient care matters.  How to contact the [facility] Attending or Consulting provider 7A - 7P or covering provider during after hours 7P -7A, for this patient?  Check the care team in [facility] and look for a) attending/consulting [facility] provider listed and b) the [facility] team listed. Page or secure chat 7A-7P. Log into [email or web address] and use 's universal password to access. If you do not have the password, please contact the hospital operator. Locate the [facility] provider  you are looking for under [facility] and page to a number that you can be directly reached. If you still have difficulty reaching the provider, please page the [facility] (Director on Call) for the Hospitalists listed on amion for assistance.

## 2024-02-09 NOTE — Plan of Care (Signed)

## 2024-02-09 NOTE — TOC Progression Note (Addendum)
 Transition of Care (TOC) - Progression Note    Patient Details  Name: Anthony A Towaoc Jr. MRN: [id number] Date of Birth: [birth year]  Transition of Care [facility]) CM/SW Contact  Naiyana Barbian A [place], LCSW Phone Number: 02/09/2024, 10:36 AM  Clinical Narrative:     CSW contacted Rosana [phone], at [facility] in [place], [place]. There was no answer, unable to leave voicemail. CSW to reach back out when able.   CSW also sent referral to Hydia at [facility] regarding bed availability.   TOC will continue to follow.   Expected Discharge Plan: Skilled Nursing Facility Barriers to Discharge: Continued Medical Work up               Expected Discharge Plan and Services In-house Referral: Clinical Social Work     Living arrangements for the past 2 months: Homeless                                       Social Drivers of Health (SDOH) Interventions SDOH Screenings   Food Insecurity: Patient Unable To Answer (12/09/2023)  Housing: Patient Unable To Answer (12/09/2023)  Transportation Needs: Patient Unable To Answer (12/09/2023)  Utilities: Patient Unable To Answer (12/09/2023)  Depression (PHQ2-9): Medium Risk (10/16/2020)  Social Connections: Unknown (12/22/2023)  Tobacco Use: High Risk (12/08/2023)    Readmission Risk Interventions    01/14/2024    9:48 AM 08/27/2023   12:21 PM 08/19/2023    9:17 AM  Readmission Risk Prevention Plan  Transportation Screening Complete Complete Complete  Medication Review Oceanographer) Complete Complete Complete  PCP or Specialist appointment within 3-5 days of discharge Complete Complete   HRI or Home Care Consult Complete Complete Complete  SW Recovery Care/Counseling Consult Complete Complete Complete  Palliative Care Screening Not Applicable Not Applicable Not Applicable  Skilled Nursing Facility Not Applicable Not Applicable Not Applicable

## 2024-02-09 NOTE — Plan of Care (Signed)
   Problem: Education: Goal: Knowledge of General Education information will improve Description Including pain rating scale, medication(s)/side effects and non-pharmacologic comfort measures Outcome: Progressing

## 2024-02-10 DIAGNOSIS — E162 Hypoglycemia, unspecified: Secondary | ICD-10-CM | POA: Diagnosis not present

## 2024-02-10 NOTE — Progress Notes (Signed)
 This Clinical research associate tried to assess Pt's skin r/t a WD order in place to his peri/anal region. Pt states that he has nothing on his sacral area, or near his anus, and that he uses cream back there but that there is nothing there. Pt refused to let me assess area after multiple attempts made. Will communicate to oncoming shift to see if he will allow them to look at his skin during shift change report.

## 2024-02-10 NOTE — Plan of Care (Signed)
  Problem: Education: Goal: Knowledge of General Education information will improve Description: Including pain rating scale, medication(s)/side effects and non-pharmacologic comfort measures 02/10/2024 0138 by Honora Frederick PARAS, RN Outcome: Progressing 02/10/2024 0138 by Honora Frederick PARAS, RN Outcome: Progressing 02/10/2024 0137 by Honora Frederick PARAS, RN Outcome: Progressing   Problem: Health Behavior/Discharge Planning: Goal: Ability to manage health-related needs will improve 02/10/2024 0138 by Honora Frederick PARAS, RN Outcome: Progressing 02/10/2024 0138 by Honora Frederick PARAS, RN Outcome: Progressing 02/10/2024 0137 by Honora Frederick PARAS, RN Outcome: Progressing   Problem: Clinical Measurements: Goal: Ability to maintain clinical measurements within normal limits will improve 02/10/2024 0138 by Honora Frederick PARAS, RN Outcome: Progressing 02/10/2024 0138 by Honora Frederick PARAS, RN Outcome: Progressing 02/10/2024 0137 by Honora Frederick PARAS, RN Outcome: Progressing Goal: Will remain free from infection 02/10/2024 0138 by Honora Frederick PARAS, RN Outcome: Progressing 02/10/2024 0138 by Honora Frederick PARAS, RN Outcome: Progressing 02/10/2024 0137 by Honora Frederick PARAS, RN Outcome: Progressing Goal: Diagnostic test results will improve 02/10/2024 0138 by Honora Frederick PARAS, RN Outcome: Progressing 02/10/2024 0138 by Honora Frederick PARAS, RN Outcome: Progressing 02/10/2024 0137 by Honora Frederick PARAS, RN Outcome: Progressing Goal: Respiratory complications will improve 02/10/2024 0138 by Honora Frederick PARAS, RN Outcome: Progressing 02/10/2024 0138 by Honora Frederick PARAS, RN Outcome: Progressing 02/10/2024 0137 by Honora Frederick PARAS, RN Outcome: Progressing Goal: Cardiovascular complication will be avoided 02/10/2024 0138 by Honora Frederick PARAS, RN Outcome: Progressing 02/10/2024 0138 by Honora Frederick PARAS, RN Outcome: Progressing 02/10/2024 0137 by Honora Frederick PARAS, RN Outcome: Progressing    Problem: Activity: Goal: Risk for activity intolerance will decrease 02/10/2024 0138 by Honora Frederick PARAS, RN Outcome: Progressing 02/10/2024 0138 by Honora Frederick PARAS, RN Outcome: Progressing 02/10/2024 0137 by Honora Frederick PARAS, RN Outcome: Progressing   Problem: Nutrition: Goal: Adequate nutrition will be maintained 02/10/2024 0138 by Honora Frederick PARAS, RN Outcome: Progressing 02/10/2024 0138 by Honora Frederick PARAS, RN Outcome: Progressing 02/10/2024 0137 by Honora Frederick PARAS, RN Outcome: Progressing   Problem: Coping: Goal: Level of anxiety will decrease 02/10/2024 0138 by Honora Frederick PARAS, RN Outcome: Progressing 02/10/2024 0138 by Honora Frederick PARAS, RN Outcome: Progressing 02/10/2024 0137 by Honora Frederick PARAS, RN Outcome: Progressing   Problem: Elimination: Goal: Will not experience complications related to bowel motility 02/10/2024 0138 by Honora Frederick PARAS, RN Outcome: Progressing 02/10/2024 0138 by Honora Frederick PARAS, RN Outcome: Progressing 02/10/2024 0137 by Honora Frederick PARAS, RN Outcome: Progressing Goal: Will not experience complications related to urinary retention 02/10/2024 0138 by Honora Frederick PARAS, RN Outcome: Progressing 02/10/2024 0138 by Honora Frederick PARAS, RN Outcome: Progressing 02/10/2024 0137 by Honora Frederick PARAS, RN Outcome: Progressing   Problem: Pain Managment: Goal: General experience of comfort will improve and/or be controlled 02/10/2024 0138 by Honora Frederick PARAS, RN Outcome: Progressing 02/10/2024 0138 by Honora Frederick PARAS, RN Outcome: Progressing 02/10/2024 0137 by Honora Frederick PARAS, RN Outcome: Progressing   Problem: Safety: Goal: Ability to remain free from injury will improve 02/10/2024 0138 by Honora Frederick PARAS, RN Outcome: Progressing 02/10/2024 0138 by Honora Frederick PARAS, RN Outcome: Progressing 02/10/2024 0137 by Honora Frederick PARAS, RN Outcome: Progressing   Problem: Skin Integrity: Goal: Risk for impaired skin  integrity will decrease 02/10/2024 0138 by Honora Frederick PARAS, RN Outcome: Progressing 02/10/2024 0138 by Honora Frederick PARAS, RN Outcome: Progressing 02/10/2024 0137 by Honora Frederick PARAS, RN Outcome: Progressing

## 2024-02-10 NOTE — Progress Notes (Signed)
 Mobility Specialist: Progress Note   02/10/24 1500  Mobility  Activity Ambulated independently in hallway;Off unit  Level of Assistance Independent  Assistive Device None  Distance Ambulated (ft) 2000 ft  Activity Response Tolerated well  Mobility Referral Yes  Mobility visit 1 Mobility  Mobility Specialist Start Time (ACUTE ONLY) 1342  Mobility Specialist Stop Time (ACUTE ONLY) 1400  Mobility Specialist Time Calculation (min) (ACUTE ONLY) 18 min    Pt received ambulating in room, agreeable to mobility session. Requesting to walk around outside. No complaints. VSS on RA. Returned to room without fault. Left ambulating in room with all needs met, call bell in reach.    Anthony [name] Mobility Specialist Please contact via SecureChat or Rehab office at [phone]

## 2024-02-10 NOTE — Progress Notes (Signed)
 PROGRESS NOTE    Anthony A Oettinger Jr.  FMW:[id number] DOB: 07/25/1955 DOA: 12/07/2023 PCP: Pcp, No   Brief Narrative:  68 y.o. male with PMH significant for homelessness, chronic alcoholism c/b withdrawals/DTs, chronic smoking, HTN, paroxysmal A-fib, h/o PE previously on Eliquis , hiatal hernia s/p Nissen fundoplication 2016, chronic low back pain, chronic anxiety, opiate withdrawal presented to the ED for confusion, low blood sugar.  From hospital stay.  Admitted on 12/07/2023. Seen by critical care, Cardiology, GI, neurology, psychiatry and palliative care.   Assessment & Plan:   Principal Problem:   Hypoglycemia Active Problems:   Paroxysmal atrial fibrillation (HCC)   Hyponatremia   HTN (hypertension)   Macrocytic anemia   History of pulmonary embolus (PE)   Alcohol  withdrawal (HCC)   Alcohol  withdrawal delirium (HCC)   Moderate protein malnutrition (HCC)   Lactic acidosis   Acute delirium   Atrial fibrillation with RVR (HCC)  SIRS  Resolved.  Completed empiric 5-day course of Zosyn .  Remains afebrile and stable   Orthostatic hypotension. Blood pressure dropped significantly on standing up in the beginning. Received IV fluid bolus.  Initiated on midodrine . Due to CHF, not a good candidate for Florinef . Continue with abdominal binder and compression stockings as well. Cosyntropin  stim test negative.  Increased midodrine  to 10 mg 3 times daily on 02/03/2024.  No more dizziness now, orthostatics are not positive anymore.   Monitor daily orthostatics.    Acute metabolic encephalopathy secondary to alcohol  withdrawal  Currently resolved. Required ICU stay with Precedex .  Monitor.   Retroperitoneal hematoma/chronic iron  deficiency anemia/acute blood loss anemia  S/p 5 units total RBCs during course of hospital stay. Hemoglobin stable.  No active bleeding.  Continue Eliquis    Paroxysmal A-fib with RVR - Initially on Cardizem  drip.  Has intermittent tachycardia.   Would avoid  Cardizem  secondary to low EF. Currently on amiodarone  and Eliquis .   Acute on chronic HFrEF  EF in the past have been around 45%. EF this admission is 35 to 45%. Cardiology was consulted earlier. GDMT is limited by hypotension. Appears to be euvolemic for now.   Hyponatremia  mild, overall stable.  Currently on urea  packet only.   Hypokalemia/hypomagnesemia  Providing IV and oral magnesium  replacement. Maintain K more than 4.   Goals of care - Patient is DNR.  Palliative care was consulted.   Disposition. Patient was homeless, TOC working on dispo discharge disposition. Per psychiatry 6/15 the patient lacks capacity for decision-making. However, pt is now very conversive, oriented, able to make his own decisions.   APS involved however now stating they cannot pursue guardianship.  Appears patient does not want to pursue ALF and stated he would walk out if placed there. Is willing to go back to the community either boarding house or similar. Working closely with TOC on disposition planning aligning with the patient's goals.  Per TOC, group home is going to talk to him and evaluate him for admission.   Moderate protein calorie malnutrition. Continue supplementation.   Reported diarrhea. Constipated. Patient reports that he has multiple diarrhea although RN is not able to corroborate with me about that history.  Diarrhea will explain hypokalemia and hypomagnesemia though.  For now monitor. Will discontinue bowel regimen.   DVT prophylaxis: Place and maintain sequential compression device Start: 01/30/24 1209 Place TED hose Start: 01/30/24 1209   Code Status: Limited: Do not attempt resuscitation (DNR) -DNR-LIMITED -Do Not Intubate/DNI   Family Communication:  None present at bedside.    Status  is: Inpatient Remains inpatient appropriate because: Difficult to place.  Medically stable for long time.   Estimated body mass index is 23.39 kg/m as calculated from the following:    Height as of this encounter: 5' 6 (1.676 m).   Weight as of this encounter: 65.7 kg.    Nutritional Assessment: Body mass index is 23.39 kg/m.SABRA Seen by dietician.  I agree with the assessment and plan as outlined below: Nutrition Status: Nutrition Problem: Moderate Malnutrition Etiology: social / environmental circumstances (ETOH use, homeless) Signs/Symptoms: moderate fat depletion, moderate muscle depletion Interventions: Refer to RD note for recommendations  . Skin Assessment: I have examined the patient's skin and I agree with the wound assessment as performed by the wound care RN as outlined below:    Consultants:    Procedures:    Antimicrobials:  Anti-infectives (From admission, onward)    Start     Dose/Rate Route Frequency Ordered Stop   01/03/24 0000  vancomycin  (VANCOREADY) IVPB 750 mg/150 mL  Status:  Discontinued        750 mg 150 mL/hr over 60 Minutes Intravenous Every 12 hours 01/02/24 1001 01/04/24 1135   01/02/24 1100  vancomycin  (VANCOREADY) IVPB 1500 mg/300 mL        1,500 mg 150 mL/hr over 120 Minutes Intravenous  Once 01/02/24 1000 01/02/24 1325   01/02/24 0400  piperacillin -tazobactam (ZOSYN ) IVPB 3.375 g  Status:  Discontinued        3.375 g 12.5 mL/hr over 240 Minutes Intravenous Every 8 hours 01/02/24 0231 01/06/24 1358   12/09/23 1100  metroNIDAZOLE  (FLAGYL ) IVPB 500 mg  Status:  Discontinued        500 mg 100 mL/hr over 60 Minutes Intravenous Every 12 hours 12/09/23 1004 12/14/23 1044   12/09/23 1000  cefTRIAXone  (ROCEPHIN ) 2 g in sodium chloride  0.9 % 100 mL IVPB  Status:  Discontinued        2 g 200 mL/hr over 30 Minutes Intravenous Every 24 hours 12/09/23 0908 12/14/23 1044         Subjective: Patient seen and examined.  He has no complaints.  Objective: Vitals:   02/09/24 2104 02/10/24 0049 02/10/24 0408 02/10/24 0737  BP: 122/87 (!) 141/100 (!) 123/92 115/80  Pulse: 83 93 85 80  Resp: 18 18 18    Temp: 98.3 F (36.8 C) 97.9 F  (36.6 C) 98.6 F (37 C) 97.7 F (36.5 C)  TempSrc: Oral Oral Oral Oral  SpO2: 99% 99% 98% 100%  Weight:      Height:        Intake/Output Summary (Last 24 hours) at 02/10/2024 0907 Last data filed at 02/10/2024 0750 Gross per 24 hour  Intake --  Output 3650 ml  Net -3650 ml   Filed Weights   01/28/24 0500 01/30/24 0400 02/02/24 1628  Weight: 65.9 kg 68.4 kg 65.7 kg    Examination:  General exam: Appears calm and comfortable  Respiratory system: Clear to auscultation. Respiratory effort normal. Cardiovascular system: S1 & S2 heard, RRR. No JVD, murmurs, rubs, gallops or clicks. No pedal edema. Gastrointestinal system: Abdomen is nondistended, soft and nontender. No organomegaly or masses felt. Normal bowel sounds heard. Central nervous system: Alert and oriented. No focal neurological deficits. Extremities: Symmetric 5 x 5 power. Skin: No rashes, lesions or ulcers.    Data Reviewed: I have personally reviewed following labs and imaging studies  CBC: Recent Labs  Lab 02/09/24 0232  WBC 5.9  NEUTROABS 2.0  HGB 12.0*  HCT 36.5*  MCV 98.9  PLT 203   Basic Metabolic Panel: Recent Labs  Lab 02/09/24 0232  NA 138  K 3.9  CL 107  CO2 22  GLUCOSE 96  BUN 42*  CREATININE 0.78  CALCIUM  9.6   GFR: Estimated Creatinine Clearance: 79.8 mL/min (by C-G formula based on SCr of 0.78 mg/dL). Liver Function Tests: No results for input(s): AST, ALT, ALKPHOS, BILITOT, PROT, ALBUMIN  in the last 168 hours. No results for input(s): LIPASE, AMYLASE in the last 168 hours. No results for input(s): AMMONIA in the last 168 hours. Coagulation Profile: No results for input(s): INR, PROTIME in the last 168 hours. Cardiac Enzymes: No results for input(s): CKTOTAL, CKMB, CKMBINDEX, TROPONINI in the last 168 hours. BNP (last 3 results) No results for input(s): PROBNP in the last 8760 hours. HbA1C: No results for input(s): HGBA1C in the last 72  hours. CBG: No results for input(s): GLUCAP in the last 168 hours.  Lipid Profile: No results for input(s): CHOL, HDL, LDLCALC, TRIG, CHOLHDL, LDLDIRECT in the last 72 hours. Thyroid Function Tests: No results for input(s): TSH, T4TOTAL, FREET4, T3FREE, THYROIDAB in the last 72 hours. Anemia Panel: No results for input(s): VITAMINB12, FOLATE, FERRITIN, TIBC, IRON , RETICCTPCT in the last 72 hours. Sepsis Labs: No results for input(s): PROCALCITON, LATICACIDVEN in the last 168 hours.  No results found for this or any previous visit (from the past 240 hours).   Radiology Studies: No results found.  Scheduled Meds:  amiodarone   200 mg Oral BID   apixaban   5 mg Oral BID   famotidine   20 mg Oral BID   feeding supplement  237 mL Oral BID BM   Gerhardt's butt cream   Topical BID   lidocaine   1 patch Transdermal Q24H   magnesium  chloride  2 tablet Oral Daily   midodrine   10 mg Oral TID WC   multivitamin with minerals  1 tablet Oral Daily   nutrition supplement (JUVEN)  1 packet Oral BID BM   urea   15 g Oral BID   Continuous Infusions:   LOS: 64 days   Fredia Skeeter, MD [facility]  02/10/2024, 9:07 AM   *Please note that this is a verbal dictation therefore any spelling or grammatical errors are due to the Dragon Medical One system interpretation.  Please page via Amion and do not message via secure chat for urgent patient care matters. Secure chat can be used for non urgent patient care matters.  How to contact the [facility] Attending or Consulting provider 7A - 7P or covering provider during after hours 7P -7A, for this patient?  Check the care team in [facility] and look for a) attending/consulting TRH provider listed and b) the TRH team listed. Page or secure chat 7A-7P. Log into [email or web address] and use [place]'s universal password to access. If you do not have the password, please contact the hospital operator. Locate the [facility] provider you  are looking for under [facility] and page to a number that you can be directly reached. If you still have difficulty reaching the provider, please page the [facility] (Director on Call) for the Hospitalists listed on amion for assistance.

## 2024-02-10 NOTE — Plan of Care (Signed)

## 2024-02-10 NOTE — TOC Progression Note (Signed)
 Transition of Care (TOC) - Progression Note    Patient Details  Name: Anthony A Hage Jr. MRN: [id number] Date of Birth: 05-Sep-1955  Transition of [facility]) CM/SW Contact  Nickayla Mcinnis A [place], LCSW Phone Number: 02/10/2024, 4:39 PM  Clinical Narrative:     CSW contacted [name] at [facility] social services regarding their leads on placement. CSW left VM with contact information to reach out to CSW.  CSW reached out to pt's sister Kyra, returning phone call requesting update. Left VM with contact to reach out to CSW.   TOC will continue to follow.   Expected Discharge Plan: Skilled Nursing Facility Barriers to Discharge: Continued Medical Work up               Expected Discharge Plan and Services In-house Referral: Clinical Social Work     Living arrangements for the past 2 months: Homeless                                       Social Drivers of Health (SDOH) Interventions SDOH Screenings   Food Insecurity: Patient Unable To Answer (12/09/2023)  Housing: Patient Unable To Answer (12/09/2023)  Transportation Needs: Patient Unable To Answer (12/09/2023)  Utilities: Patient Unable To Answer (12/09/2023)  Depression (PHQ2-9): Medium Risk (10/16/2020)  Social Connections: Unknown (12/22/2023)  Tobacco Use: High Risk (12/08/2023)    Readmission Risk Interventions    01/14/2024    9:48 AM 08/27/2023   12:21 PM 08/19/2023    9:17 AM  Readmission Risk Prevention Plan  Transportation Screening Complete Complete Complete  Medication Review Oceanographer) Complete Complete Complete  PCP or Specialist appointment within 3-5 days of discharge Complete Complete   HRI or Home Care Consult Complete Complete Complete  SW Recovery Care/Counseling Consult Complete Complete Complete  Palliative Care Screening Not Applicable Not Applicable Not Applicable  Skilled Nursing Facility Not Applicable Not Applicable Not Applicable

## 2024-02-11 DIAGNOSIS — E162 Hypoglycemia, unspecified: Secondary | ICD-10-CM | POA: Diagnosis not present

## 2024-02-11 NOTE — Plan of Care (Signed)

## 2024-02-11 NOTE — Progress Notes (Signed)
 PROGRESS NOTE    Anthony A Gerdts Jr.  FMW:[id number] DOB: Sep 03, 1955 DOA: 12/07/2023 PCP: Pcp, No   Brief Narrative:  68 y.o. male with PMH significant for homelessness, chronic alcoholism c/b withdrawals/DTs, chronic smoking, HTN, paroxysmal A-fib, h/o PE previously on Eliquis , hiatal hernia s/p Nissen fundoplication 2016, chronic low back pain, chronic anxiety, opiate withdrawal presented to the ED for confusion, low blood sugar.  From hospital stay.  Admitted on 12/07/2023. Seen by critical care, Cardiology, GI, neurology, psychiatry and palliative care.   Assessment & Plan:   Principal Problem:   Hypoglycemia Active Problems:   Paroxysmal atrial fibrillation (HCC)   Hyponatremia   HTN (hypertension)   Macrocytic anemia   History of pulmonary embolus (PE)   Alcohol  withdrawal (HCC)   Alcohol  withdrawal delirium (HCC)   Moderate protein malnutrition (HCC)   Lactic acidosis   Acute delirium   Atrial fibrillation with RVR (HCC)  SIRS  Resolved.  Completed empiric 5-day course of Zosyn .  Remains afebrile and stable   Orthostatic hypotension. Blood pressure dropped significantly on standing up in the beginning. Received IV fluid bolus.  Initiated on midodrine . Due to CHF, not a good candidate for Florinef . Continue with abdominal binder and compression stockings as well. Cosyntropin  stim test negative.  Increased midodrine  to 10 mg 3 times daily on 02/03/2024.  No more dizziness now, orthostatics are not positive anymore.   Monitor daily orthostatics.    Acute metabolic encephalopathy secondary to alcohol  withdrawal  Currently resolved. Required ICU stay with Precedex .  Monitor.   Retroperitoneal hematoma/chronic iron  deficiency anemia/acute blood loss anemia  S/p 5 units total RBCs during course of hospital stay. Hemoglobin stable.  No active bleeding.  Continue Eliquis    Paroxysmal A-fib with RVR - Initially on Cardizem  drip.  Has intermittent tachycardia.   Would avoid  Cardizem  secondary to low EF. Currently on amiodarone  and Eliquis .   Acute on chronic HFrEF  EF in the past have been around 45%. EF this admission is 35 to 45%. Cardiology was consulted earlier. GDMT is limited by hypotension. Appears to be euvolemic for now.   Hyponatremia  mild, overall stable.  Currently on urea  packet only.   Hypokalemia/hypomagnesemia  Providing IV and oral magnesium  replacement. Maintain K more than 4.   Goals of care - Patient is DNR.  Palliative care was consulted.   Disposition. Patient was homeless, TOC working on dispo discharge disposition. Per psychiatry 6/15 the patient lacks capacity for decision-making. However, pt is now very conversive, oriented, able to make his own decisions.   APS involved however now stating they cannot pursue guardianship.  Appears patient does not want to pursue ALF and stated he would walk out if placed there. Is willing to go back to the community either boarding house or similar. Working closely with TOC on disposition planning aligning with the patient's goals.  Please refer to [facility] note.   Moderate protein calorie malnutrition. Continue supplementation.   Reported diarrhea. Constipated. Patient reports that he has multiple diarrhea although RN is not able to corroborate with me about that history.  Diarrhea will explain hypokalemia and hypomagnesemia though.  For now monitor. Will discontinue bowel regimen.   DVT prophylaxis: Place and maintain sequential compression device Start: 01/30/24 1209 Place TED hose Start: 01/30/24 1209   Code Status: Limited: Do not attempt resuscitation (DNR) -DNR-LIMITED -Do Not Intubate/DNI   Family Communication:  None present at bedside.    Status is: Inpatient Remains inpatient appropriate because: Difficult to place.  Medically stable for long time.   Estimated body mass index is 23.39 kg/m as calculated from the following:   Height as of this encounter: 5' 6 (1.676 m).    Weight as of this encounter: 65.7 kg.    Nutritional Assessment: Body mass index is 23.39 kg/m.SABRA Seen by dietician.  I agree with the assessment and plan as outlined below: Nutrition Status: Nutrition Problem: Moderate Malnutrition Etiology: social / environmental circumstances (ETOH use, homeless) Signs/Symptoms: moderate fat depletion, moderate muscle depletion Interventions: Refer to RD note for recommendations  . Skin Assessment: I have examined the patient's skin and I agree with the wound assessment as performed by the wound care RN as outlined below:    Consultants:    Procedures:    Antimicrobials:  Anti-infectives (From admission, onward)    Start     Dose/Rate Route Frequency Ordered Stop   01/03/24 0000  vancomycin  (VANCOREADY) IVPB 750 mg/150 mL  Status:  Discontinued        750 mg 150 mL/hr over 60 Minutes Intravenous Every 12 hours 01/02/24 1001 01/04/24 1135   01/02/24 1100  vancomycin  (VANCOREADY) IVPB 1500 mg/300 mL        1,500 mg 150 mL/hr over 120 Minutes Intravenous  Once 01/02/24 1000 01/02/24 1325   01/02/24 0400  piperacillin -tazobactam (ZOSYN ) IVPB 3.375 g  Status:  Discontinued        3.375 g 12.5 mL/hr over 240 Minutes Intravenous Every 8 hours 01/02/24 0231 01/06/24 1358   12/09/23 1100  metroNIDAZOLE  (FLAGYL ) IVPB 500 mg  Status:  Discontinued        500 mg 100 mL/hr over 60 Minutes Intravenous Every 12 hours 12/09/23 1004 12/14/23 1044   12/09/23 1000  cefTRIAXone  (ROCEPHIN ) 2 g in sodium chloride  0.9 % 100 mL IVPB  Status:  Discontinued        2 g 200 mL/hr over 30 Minutes Intravenous Every 24 hours 12/09/23 0908 12/14/23 1044         Subjective: Patient seen and examined, no complaints.  Objective: Vitals:   02/10/24 2102 02/11/24 0501 02/11/24 0603 02/11/24 0757  BP: 127/83 120/87 109/76 122/77  Pulse: 82 99 94 81  Resp: 18 18  16   Temp: 97.7 F (36.5 C) 98 F (36.7 C)  97.6 F (36.4 C)  TempSrc: Oral Oral  Oral  SpO2:  100% 99%  100%  Weight:      Height:        Intake/Output Summary (Last 24 hours) at 02/11/2024 0910 Last data filed at 02/11/2024 0501 Gross per 24 hour  Intake 680 ml  Output 1900 ml  Net -1220 ml   Filed Weights   01/28/24 0500 01/30/24 0400 02/02/24 1628  Weight: 65.9 kg 68.4 kg 65.7 kg    Examination:  General exam: Appears calm and comfortable  Respiratory system: Clear to auscultation. Respiratory effort normal. Cardiovascular system: S1 & S2 heard, RRR. No JVD, murmurs, rubs, gallops or clicks. No pedal edema. Gastrointestinal system: Abdomen is nondistended, soft and nontender. No organomegaly or masses felt. Normal bowel sounds heard. Central nervous system: Alert and oriented. No focal neurological deficits. Extremities: Symmetric 5 x 5 power. Skin: No rashes, lesions or ulcers.    Data Reviewed: I have personally reviewed following labs and imaging studies  CBC: Recent Labs  Lab 02/09/24 0232  WBC 5.9  NEUTROABS 2.0  HGB 12.0*  HCT 36.5*  MCV 98.9  PLT 203   Basic Metabolic Panel: Recent Labs  Lab 02/09/24 0232  NA 138  K 3.9  CL 107  CO2 22  GLUCOSE 96  BUN 42*  CREATININE 0.78  CALCIUM  9.6   GFR: Estimated Creatinine Clearance: 79.8 mL/min (by C-G formula based on SCr of 0.78 mg/dL). Liver Function Tests: No results for input(s): AST, ALT, ALKPHOS, BILITOT, PROT, ALBUMIN  in the last 168 hours. No results for input(s): LIPASE, AMYLASE in the last 168 hours. No results for input(s): AMMONIA in the last 168 hours. Coagulation Profile: No results for input(s): INR, PROTIME in the last 168 hours. Cardiac Enzymes: No results for input(s): CKTOTAL, CKMB, CKMBINDEX, TROPONINI in the last 168 hours. BNP (last 3 results) No results for input(s): PROBNP in the last 8760 hours. HbA1C: No results for input(s): HGBA1C in the last 72 hours. CBG: No results for input(s): GLUCAP in the last 168 hours.  Lipid  Profile: No results for input(s): CHOL, HDL, LDLCALC, TRIG, CHOLHDL, LDLDIRECT in the last 72 hours. Thyroid Function Tests: No results for input(s): TSH, T4TOTAL, FREET4, T3FREE, THYROIDAB in the last 72 hours. Anemia Panel: No results for input(s): VITAMINB12, FOLATE, FERRITIN, TIBC, IRON , RETICCTPCT in the last 72 hours. Sepsis Labs: No results for input(s): PROCALCITON, LATICACIDVEN in the last 168 hours.  No results found for this or any previous visit (from the past 240 hours).   Radiology Studies: No results found.  Scheduled Meds:  amiodarone   200 mg Oral BID   apixaban   5 mg Oral BID   famotidine   20 mg Oral BID   feeding supplement  237 mL Oral BID BM   Gerhardt's butt cream   Topical BID   lidocaine   1 patch Transdermal Q24H   magnesium  chloride  2 tablet Oral Daily   midodrine   10 mg Oral TID WC   multivitamin with minerals  1 tablet Oral Daily   nutrition supplement (JUVEN)  1 packet Oral BID BM   urea   15 g Oral BID   Continuous Infusions:   LOS: 65 days   Fredia Skeeter, MD [facility]  02/11/2024, 9:10 AM   *Please note that this is a verbal dictation therefore any spelling or grammatical errors are due to the Dragon Medical One system interpretation.  Please page via Amion and do not message via secure chat for urgent patient care matters. Secure chat can be used for non urgent patient care matters.  How to contact the TRH Attending or Consulting provider 7A - 7P or covering provider during after hours 7P -7A, for this patient?  Check the care team in [facility] and look for a) attending/consulting TRH provider listed and b) the TRH team listed. Page or secure chat 7A-7P. Log into [email or web address] and use [place]'s universal password to access. If you do not have the password, please contact the hospital operator. Locate the TR[facility] provider you are looking for under [facility] and page to a number that you can be  directly reached. If you still have difficulty reaching the provider, please page the [facility] (Director on Call) for the Hospitalists listed on amion for assistance.

## 2024-02-12 DIAGNOSIS — E162 Hypoglycemia, unspecified: Secondary | ICD-10-CM | POA: Diagnosis not present

## 2024-02-12 MED ORDER — METHOCARBAMOL 500 MG PO TABS
500.0000 mg | ORAL_TABLET | Freq: Once | ORAL | Status: AC | PRN
  Administered 2024-02-12: 500 mg via ORAL
  Filled 2024-02-12: qty 1

## 2024-02-12 NOTE — Progress Notes (Signed)
 PROGRESS NOTE    Anthony A Kable Jr.  FMW:[id number] DOB: 1955-07-26 DOA: 12/07/2023 PCP: Pcp, No   Brief Narrative:  68 y.o. male with homelessness, chronic alcoholism c/b withdrawals/DTs, chronic smoking, HTN, paroxysmal A-fib, h/o PE previously on Eliquis , hiatal hernia s/p Nissen fundoplication 2016, chronic low back pain, chronic anxiety, opiate withdrawal presented to the ED for confusion, low blood sugar.  Prolonged hospital stay due to unsafe disposition.  Admitted on 12/07/2023. Seen by critical care, Cardiology, GI, neurology, psychiatry and palliative care.  Medically stabilized.  Waiting for safe disposition plan.  Assessment & Plan:   Principal Problem:   Hypoglycemia Active Problems:   Paroxysmal atrial fibrillation (HCC)   Hyponatremia   HTN (hypertension)   Macrocytic anemia   History of pulmonary embolus (PE)   Alcohol  withdrawal (HCC)   Alcohol  withdrawal delirium (HCC)   Moderate protein malnutrition (HCC)   Lactic acidosis   Acute delirium   Atrial fibrillation with RVR (HCC)  SIRS  Resolved.  Completed empiric 5-day course of Zosyn .  Remains afebrile and stable   Orthostatic hypotension. Blood pressure dropped significantly on standing up in the beginning. Received IV fluid bolus.  Initiated on midodrine . Due to CHF, not a good candidate for Florinef . Continue with abdominal binder and compression stockings as well. Cosyntropin  stim test negative.  Increased midodrine  to 10 mg 3 times daily on 02/03/2024.  No more dizziness now, orthostatics are not positive anymore.   Does not need to treat asymptomatic low blood pressures.   Acute metabolic encephalopathy secondary to alcohol  withdrawal  Currently resolved. Required ICU stay with Precedex .  Monitor.   Retroperitoneal hematoma/chronic iron  deficiency anemia/acute blood loss anemia  S/p 5 units total RBCs during course of hospital stay. Hemoglobin stable.  No active bleeding.  Continue Eliquis     Paroxysmal A-fib with RVR - Initially on Cardizem  drip.  Has intermittent tachycardia.   Would avoid Cardizem  secondary to low EF. Currently on amiodarone  and Eliquis .   Acute on chronic HFrEF  EF in the past have been around 45%. EF this admission is 35 to 45%. Cardiology was consulted earlier. GDMT is limited by hypotension. Appears to be euvolemic for now.   Hyponatremia  mild, overall stable.  Currently on urea  packet only.   Hypokalemia/hypomagnesemia  Adequate.  Intermittently checking.   Goals of care - Patient is DNR.  Palliative care was following.   Disposition. Patient was homeless, TOC working on dispo discharge disposition. Per psychiatry 6/15 the patient lacks capacity for decision-making. However, pt is now very conversive, oriented, able to make his own decisions.   APS involved however now stating they cannot pursue guardianship.  Appears patient does not want to pursue ALF and stated he would walk out if placed there. Is willing to go back to the community either boarding house or similar. Working closely with TOC on disposition planning aligning with the patient's goals.  Please refer to [facility] note.   Moderate protein calorie malnutrition. Continue supplementation.   DVT prophylaxis: Place and maintain sequential compression device Start: 01/30/24 1209 Place TED hose Start: 01/30/24 1209   Code Status: Limited: Do not attempt resuscitation (DNR) -DNR-LIMITED -Do Not Intubate/DNI   Family Communication:  None present at bedside.    Status is: Inpatient Remains inpatient appropriate because: Difficult to place.  Medically stable for long time.   Estimated body mass index is 23.39 kg/m as calculated from the following:   Height as of this encounter: 5' 6 (1.676 m).   Weight  as of this encounter: 65.7 kg.    Nutritional Assessment: Body mass index is 23.39 kg/m.SABRA Seen by dietician.  I agree with the assessment and plan as outlined below: Nutrition  Status: Nutrition Problem: Moderate Malnutrition Etiology: social / environmental circumstances (ETOH use, homeless) Signs/Symptoms: moderate fat depletion, moderate muscle depletion Interventions: Refer to RD note for recommendations  . Skin Assessment: I have examined the patient's skin and I agree with the wound assessment as performed by the wound care RN as outlined below:    Consultants:    Procedures:    Antimicrobials:  Anti-infectives (From admission, onward)    Start     Dose/Rate Route Frequency Ordered Stop   01/03/24 0000  vancomycin  (VANCOREADY) IVPB 750 mg/150 mL  Status:  Discontinued        750 mg 150 mL/hr over 60 Minutes Intravenous Every 12 hours 01/02/24 1001 01/04/24 1135   01/02/24 1100  vancomycin  (VANCOREADY) IVPB 1500 mg/300 mL        1,500 mg 150 mL/hr over 120 Minutes Intravenous  Once 01/02/24 1000 01/02/24 1325   01/02/24 0400  piperacillin -tazobactam (ZOSYN ) IVPB 3.375 g  Status:  Discontinued        3.375 g 12.5 mL/hr over 240 Minutes Intravenous Every 8 hours 01/02/24 0231 01/06/24 1358   12/09/23 1100  metroNIDAZOLE  (FLAGYL ) IVPB 500 mg  Status:  Discontinued        500 mg 100 mL/hr over 60 Minutes Intravenous Every 12 hours 12/09/23 1004 12/14/23 1044   12/09/23 1000  cefTRIAXone  (ROCEPHIN ) 2 g in sodium chloride  0.9 % 100 mL IVPB  Status:  Discontinued        2 g 200 mL/hr over 30 Minutes Intravenous Every 24 hours 12/09/23 0908 12/14/23 1044         Subjective: Patient seen and examined.  Sitting at the edge of the bed.  Denies any complaints.  Objective: Vitals:   02/12/24 0356 02/12/24 0719 02/12/24 0720 02/12/24 0722  BP: 115/80 132/87 119/86 (!) 89/75  Pulse: 95 91 97 (!) 105  Resp:  18 16 16   Temp: 98 F (36.7 C) (!) 97.3 F (36.3 C)    TempSrc: Oral Oral    SpO2: 100% 99% 100% 99%  Weight:      Height:       No intake or output data in the 24 hours ending 02/12/24 0806  Filed Weights   01/28/24 0500 01/30/24 0400  02/02/24 1628  Weight: 65.9 kg 68.4 kg 65.7 kg    Examination:  General: Looks fairly comfortable. Cardiovascular: S1-S2 normal. Respiratory: Bilateral clear. Gastrointestinal: Soft and nontender. Ext: No deformities.  No cyanosis or edema. Neuro: Alert awake and mostly oriented. Denies any hallucinations or delusions.    Data Reviewed: I have personally reviewed following labs and imaging studies  CBC: Recent Labs  Lab 02/09/24 0232  WBC 5.9  NEUTROABS 2.0  HGB 12.0*  HCT 36.5*  MCV 98.9  PLT 203   Basic Metabolic Panel: Recent Labs  Lab 02/09/24 0232  NA 138  K 3.9  CL 107  CO2 22  GLUCOSE 96  BUN 42*  CREATININE 0.78  CALCIUM  9.6   GFR: Estimated Creatinine Clearance: 79.8 mL/min (by C-G formula based on SCr of 0.78 mg/dL). Liver Function Tests: No results for input(s): AST, ALT, ALKPHOS, BILITOT, PROT, ALBUMIN  in the last 168 hours. No results for input(s): LIPASE, AMYLASE in the last 168 hours. No results for input(s): AMMONIA in the last 168 hours. Coagulation  Profile: No results for input(s): INR, PROTIME in the last 168 hours. Cardiac Enzymes: No results for input(s): CKTOTAL, CKMB, CKMBINDEX, TROPONINI in the last 168 hours. BNP (last 3 results) No results for input(s): PROBNP in the last 8760 hours. HbA1C: No results for input(s): HGBA1C in the last 72 hours. CBG: No results for input(s): GLUCAP in the last 168 hours.  Lipid Profile: No results for input(s): CHOL, HDL, LDLCALC, TRIG, CHOLHDL, LDLDIRECT in the last 72 hours. Thyroid Function Tests: No results for input(s): TSH, T4TOTAL, FREET4, T3FREE, THYROIDAB in the last 72 hours. Anemia Panel: No results for input(s): VITAMINB12, FOLATE, FERRITIN, TIBC, IRON , RETICCTPCT in the last 72 hours. Sepsis Labs: No results for input(s): PROCALCITON, LATICACIDVEN in the last 168 hours.  No results found for this or any  previous visit (from the past 240 hours).   Radiology Studies: No results found.  Scheduled Meds:  amiodarone   200 mg Oral BID   apixaban   5 mg Oral BID   famotidine   20 mg Oral BID   feeding supplement  237 mL Oral BID BM   Gerhardt's butt cream   Topical BID   lidocaine   1 patch Transdermal Q24H   magnesium  chloride  2 tablet Oral Daily   midodrine   10 mg Oral TID WC   multivitamin with minerals  1 tablet Oral Daily   nutrition supplement (JUVEN)  1 packet Oral BID BM   urea   15 g Oral BID   Continuous Infusions:   LOS: 66 days   Renato Applebaum, MD [facility]  .

## 2024-02-12 NOTE — Plan of Care (Signed)

## 2024-02-12 NOTE — Progress Notes (Signed)
 Mobility Specialist: Progress Note   02/12/24 1300  Mobility  Activity Ambulated independently in hallway;Off unit  Level of Assistance Independent  Assistive Device None  Distance Ambulated (ft) 3000 ft  Activity Response Tolerated well  Mobility Referral Yes  Mobility visit 1 Mobility  Mobility Specialist Start Time (ACUTE ONLY) 1215  Mobility Specialist Stop Time (ACUTE ONLY) 1235  Mobility Specialist Time Calculation (min) (ACUTE ONLY) 20 min    Pt received standing at sink, agreeable to mobility session. No complaints. VSS on RA. Returned to room without fault. Left ambulating in hallway with all needs met, call bell in reach.    Ileana Lute Mobility Specialist Please contact via SecureChat or Rehab office at [phone]

## 2024-02-13 DIAGNOSIS — E162 Hypoglycemia, unspecified: Secondary | ICD-10-CM | POA: Diagnosis not present

## 2024-02-13 MED ORDER — METHOCARBAMOL 500 MG PO TABS
500.0000 mg | ORAL_TABLET | Freq: Three times a day (TID) | ORAL | Status: DC | PRN
  Administered 2024-02-13 – 2024-02-28 (×30): 500 mg via ORAL
  Filled 2024-02-13 (×29): qty 1

## 2024-02-13 NOTE — Plan of Care (Signed)

## 2024-02-13 NOTE — Progress Notes (Signed)
 Progress Note   Patient: Anthony A Eid Jr. FMW:[id number] DOB: 12/28/1955 DOA: 12/07/2023     67 DOS: the patient was seen and examined on 02/13/2024   Brief Narrative:  68 y.o. male with homelessness, chronic alcoholism c/b withdrawals/DTs, chronic smoking, HTN, paroxysmal A-fib, h/o PE previously on Eliquis , hiatal hernia s/p Nissen fundoplication 2016, chronic low back pain, chronic anxiety, opiate withdrawal presented to the ED for confusion, low blood sugar.  Prolonged hospital stay due to unsafe disposition.  Admitted on 12/07/2023. Seen by critical care, Cardiology, GI, neurology, psychiatry and palliative care.  Medically stabilized.  Waiting for safe disposition plan.   Assessment & Plan:   Principal Problem:   Hypoglycemia Active Problems:   Paroxysmal atrial fibrillation (HCC)   Hyponatremia   HTN (hypertension)   Macrocytic anemia   History of pulmonary embolus (PE)   Alcohol  withdrawal (HCC)   Alcohol  withdrawal delirium (HCC)   Moderate protein malnutrition (HCC)   Lactic acidosis   Acute delirium   Atrial fibrillation with RVR (HCC)   SIRS  Resolved.  Completed empiric 5-day course of Zosyn .  Remains afebrile and stable   Orthostatic hypotension. Blood pressure dropped significantly on standing up in the beginning. Received IV fluid bolus.  Initiated on midodrine . Due to CHF, not a good candidate for Florinef . Continue with abdominal binder and compression stockings as well. Cosyntropin  stim test negative.  Increased midodrine  to 10 mg 3 times daily on 02/03/2024.  No more dizziness now, orthostatics are not positive anymore.   Does not need to treat asymptomatic low blood pressures.   Acute metabolic encephalopathy secondary to alcohol  withdrawal  Currently resolved. Required ICU stay with Precedex .  Monitor.   Retroperitoneal hematoma/chronic iron  deficiency anemia/acute blood loss anemia  S/p 5 units total RBCs during course of hospital stay. Hemoglobin  stable.  No active bleeding.  Continue Eliquis    Paroxysmal A-fib with RVR - Initially on Cardizem  drip.  Has intermittent tachycardia.   Would avoid Cardizem  secondary to low EF. Currently on amiodarone  and Eliquis .   Acute on chronic HFrEF  EF in the past have been around 45%. EF this admission is 35 to 45%. Cardiology was consulted earlier. GDMT is limited by hypotension. Appears to be euvolemic for now.   Hyponatremia  mild, overall stable.  Currently on urea  packet only.   Hypokalemia/hypomagnesemia  Adequate.  Intermittently checking.   Goals of care - Patient is DNR.  Palliative care was following.   Disposition. Patient was homeless, TOC working on dispo discharge disposition. Per psychiatry 6/15 the patient lacks capacity for decision-making. However, pt is now very conversive, oriented, able to make his own decisions.   APS involved however now stating they cannot pursue guardianship.  Appears patient does not want to pursue ALF and stated he would walk out if placed there. Is willing to go back to the community either boarding house or similar. Working closely with TOC on disposition planning aligning with the patient's goals.  Please refer to [facility] note.   Moderate protein calorie malnutrition. Continue supplementation.   DVT prophylaxis: Place and maintain sequential compression device Start: 01/30/24 1209 Place TED hose Start: 01/30/24 1209   Code Status: Limited: Do not attempt resuscitation (DNR) -DNR-LIMITED -Do Not Intubate/DNI   .     Status is: Inpatient Remains inpatient appropriate because: Difficult to place.  Medically stable for long time.    Subjective: Patient seen and examined at bedside this morning He denies any acute overnight complaints or events  Examination:   General: Looks fairly comfortable. Cardiovascular: S1-S2 normal. Respiratory: Bilateral clear. Gastrointestinal: Soft and nontender. Ext: No deformities.  No cyanosis or  edema. Neuro: Alert awake and mostly oriented. Denies any hallucinations or delusions.       Data Reviewed:  I have reviewed patient's data as mentioned below    Latest Ref Rng & Units 02/09/2024    2:32 AM 02/02/2024    3:10 AM 01/27/2024    4:30 PM  CBC  WBC 4.0 - 10.5 K/uL 5.9  4.2  4.6   Hemoglobin 13.0 - 17.0 g/dL 87.9  87.2  87.7   Hematocrit 39.0 - 52.0 % 36.5  38.5  37.8   Platelets 150 - 400 K/uL 203  227  259        Latest Ref Rng & Units 02/09/2024    2:32 AM 02/02/2024    3:10 AM 02/01/2024    8:14 AM  BMP  Glucose 70 - 99 mg/dL 96  871  83   BUN 8 - 23 mg/dL 42  49  31   Creatinine 0.61 - 1.24 mg/dL 9.21  9.14  9.33   Sodium 135 - 145 mmol/L 138  138  136   Potassium 3.5 - 5.1 mmol/L 3.9  3.7  4.1   Chloride 98 - 111 mmol/L 107  104  102   CO2 22 - 32 mmol/L 22  22  23    Calcium  8.9 - 10.3 mg/dL 9.6  9.5  9.5     Vitals:   02/13/24 0428 02/13/24 0751 02/13/24 0754 02/13/24 0755  BP: 103/72 119/78 (!) 115/92 97/85  Pulse: (!) 104 80 95 99  Resp: 18 16 18 16   Temp: 97.6 F (36.4 C)  97.8 F (36.6 C)   TempSrc:      SpO2: 99% 100% 100% 100%  Weight:      Height:         Family Communication: No family present at bedside  Disposition: Status is: Inpatient  Time spent: 35 minutes  Author: Drue ONEIDA Potter, MD 02/13/2024 4:42 PM  For on call review [email or web address].

## 2024-02-14 DIAGNOSIS — E162 Hypoglycemia, unspecified: Secondary | ICD-10-CM | POA: Diagnosis not present

## 2024-02-14 LAB — BASIC METABOLIC PANEL WITH GFR
Anion gap: 9 (ref 5–15)
BUN: 44 mg/dL — ABNORMAL HIGH (ref 8–23)
CO2: 21 mmol/L — ABNORMAL LOW (ref 22–32)
Calcium: 9.3 mg/dL (ref 8.9–10.3)
Chloride: 109 mmol/L (ref 98–111)
Creatinine, Ser: 0.86 mg/dL (ref 0.61–1.24)
GFR, Estimated: >60 mL/min (ref >60)
Glucose, Bld: 92 mg/dL (ref 70–99)
Potassium: 3.8 mmol/L (ref 3.5–5.1)
Sodium: 139 mmol/L (ref 135–145)

## 2024-02-14 LAB — CBC
HCT: 35.4 % — ABNORMAL LOW (ref 39.0–52.0)
Hemoglobin: 11.7 g/dL — ABNORMAL LOW (ref 13.0–17.0)
MCH: 32.6 pg (ref 26.0–34.0)
MCHC: 33.1 g/dL (ref 30.0–36.0)
MCV: 98.6 fL (ref 80.0–100.0)
Platelets: 200 K/uL (ref 150–400)
RBC: 3.59 MIL/uL — ABNORMAL LOW (ref 4.22–5.81)
RDW: 17.2 % — ABNORMAL HIGH (ref 11.5–15.5)
WBC: 4.1 K/uL (ref 4.0–10.5)
nRBC: 0 % (ref 0.0–0.2)

## 2024-02-14 NOTE — Progress Notes (Signed)
 Progress Note   Patient: Anthony A Banbury Jr. FMW:[id number] DOB: 1955-08-11 DOA: 12/07/2023     68 DOS: the patient was seen and examined on 02/14/2024    Brief Narrative:  68 y.o. male with homelessness, chronic alcoholism c/b withdrawals/DTs, chronic smoking, HTN, paroxysmal A-fib, h/o PE previously on Eliquis , hiatal hernia s/p Nissen fundoplication 2016, chronic low back pain, chronic anxiety, opiate withdrawal presented to the ED for confusion, low blood sugar.  Prolonged hospital stay due to unsafe disposition.  Admitted on 12/07/2023. Seen by critical care, Cardiology, GI, neurology, psychiatry and palliative care.  Medically stabilized.  Waiting for safe disposition plan.   Assessment & Plan:   Principal Problem:   Hypoglycemia Active Problems:   Paroxysmal atrial fibrillation (HCC)   Hyponatremia   HTN (hypertension)   Macrocytic anemia   History of pulmonary embolus (PE)   Alcohol  withdrawal (HCC)   Alcohol  withdrawal delirium (HCC)   Moderate protein malnutrition (HCC)   Lactic acidosis   Acute delirium   Atrial fibrillation with RVR (HCC)   SIRS  Resolved.  Completed empiric 5-day course of Zosyn .  Remains afebrile and stable   Orthostatic hypotension. Blood pressure dropped significantly on standing up in the beginning. Received IV fluid bolus.  Initiated on midodrine . Due to CHF, not a good candidate for Florinef . Continue with abdominal binder and compression stockings as well. Cosyntropin  stim test negative.  Increased midodrine  to 10 mg 3 times daily on 02/03/2024.  No more dizziness now, orthostatics are not positive anymore.   Does not need to treat asymptomatic low blood pressures.   Acute metabolic encephalopathy secondary to alcohol  withdrawal  Currently resolved. Required ICU stay with Precedex .  Monitor.   Retroperitoneal hematoma/chronic iron  deficiency anemia/acute blood loss anemia  S/p 5 units total RBCs during course of hospital stay. Hemoglobin  stable.  No active bleeding.  Continue Eliquis    Paroxysmal A-fib with RVR - Initially on Cardizem  drip.  Has intermittent tachycardia.   Would avoid Cardizem  secondary to low EF. Currently on amiodarone  and Eliquis .   Acute on chronic HFrEF  EF in the past have been around 45%. EF this admission is 35 to 45%. Cardiology was consulted earlier. GDMT is limited by hypotension. Appears to be euvolemic for now.   Hyponatremia  mild, overall stable.  Currently on urea  packet only.   Hypokalemia/hypomagnesemia  Adequate.  Intermittently checking.   Goals of care - Patient is DNR.  Palliative care was following.   Disposition. Patient was homeless, TOC working on dispo discharge disposition. Per psychiatry 6/15 the patient lacks capacity for decision-making. However, pt is now very conversive, oriented, able to make his own decisions.   APS involved however now stating they cannot pursue guardianship.  Appears patient does not want to pursue ALF and stated he would walk out if placed there. Is willing to go back to the community either boarding house or similar. Working closely with TOC on disposition planning aligning with the patient's goals.  Please refer to [facility] note.   Moderate protein calorie malnutrition. Continue supplementation.   DVT prophylaxis: Place and maintain sequential compression device Start: 01/30/24 1209 Place TED hose Start: 01/30/24 1209   Code Status: Limited: Do not attempt resuscitation (DNR) -DNR-LIMITED -Do Not Intubate/DNI   .     Status is: Inpatient Remains inpatient appropriate because: Difficult to place.  Medically stable for long time.    Subjective: Patient was seen during rounds this morning He denies any acute overnight events He did complain  of some numbness noted to the right thigh area however he is able to walk about with no issues.   Examination:   General: Looks fairly comfortable. Cardiovascular: S1-S2 normal. Respiratory:  Bilateral clear. Gastrointestinal: Soft and nontender. Ext: No deformities.  No cyanosis or edema. Neuro: Alert awake and mostly oriented. Denies any hallucinations or delusions.       Data Reviewed:     Latest Ref Rng & Units 02/14/2024    4:05 AM 02/09/2024    2:32 AM 02/02/2024    3:10 AM  CBC  WBC 4.0 - 10.5 K/uL 4.1  5.9  4.2   Hemoglobin 13.0 - 17.0 g/dL 88.2  87.9  87.2   Hematocrit 39.0 - 52.0 % 35.4  36.5  38.5   Platelets 150 - 400 K/uL 200  203  227        Latest Ref Rng & Units 02/14/2024    4:05 AM 02/09/2024    2:32 AM 02/02/2024    3:10 AM  BMP  Glucose 70 - 99 mg/dL 92  96  871   BUN 8 - 23 mg/dL 44  42  49   Creatinine 0.61 - 1.24 mg/dL 9.13  9.21  9.14   Sodium 135 - 145 mmol/L 139  138  138   Potassium 3.5 - 5.1 mmol/L 3.8  3.9  3.7   Chloride 98 - 111 mmol/L 109  107  104   CO2 22 - 32 mmol/L 21  22  22    Calcium  8.9 - 10.3 mg/dL 9.3  9.6  9.5      Vitals:   02/14/24 0338 02/14/24 0753 02/14/24 0754 02/14/24 0801  BP: 121/87 (!) 128/92 (!) 125/99 (!) 124/112  Pulse: 88 90 (!) 103 (!) 102  Resp: 18 18 16    Temp: 97.9 F (36.6 C) (!) 97.5 F (36.4 C)    TempSrc:      SpO2: 100% 100% 100% 97%  Weight:      Height:        Author: Drue ONEIDA Potter, MD 02/14/2024 3:33 PM  For on call review [email or web address].

## 2024-02-14 NOTE — Plan of Care (Signed)

## 2024-02-14 NOTE — Plan of Care (Signed)
  Problem: Education: Goal: Knowledge of General Education information will improve Description: Including pain rating scale, medication(s)/side effects and non-pharmacologic comfort measures Outcome: Adequate for Discharge   Problem: Health Behavior/Discharge Planning: Goal: Ability to manage health-related needs will improve Outcome: Adequate for Discharge   Problem: Clinical Measurements: Goal: Ability to maintain clinical measurements within normal limits will improve Outcome: Adequate for Discharge Goal: Will remain free from infection Outcome: Adequate for Discharge Goal: Diagnostic test results will improve Outcome: Adequate for Discharge Goal: Respiratory complications will improve Outcome: Adequate for Discharge Goal: Cardiovascular complication will be avoided Outcome: Adequate for Discharge   Problem: Activity: Goal: Risk for activity intolerance will decrease Outcome: Adequate for Discharge   Problem: Nutrition: Goal: Adequate nutrition will be maintained Outcome: Adequate for Discharge   Problem: Coping: Goal: Level of anxiety will decrease Outcome: Adequate for Discharge   Problem: Elimination: Goal: Will not experience complications related to bowel motility Outcome: Adequate for Discharge Goal: Will not experience complications related to urinary retention Outcome: Adequate for Discharge   Problem: Pain Managment: Goal: General experience of comfort will improve and/or be controlled Outcome: Adequate for Discharge   Problem: Safety: Goal: Ability to remain free from injury will improve Outcome: Adequate for Discharge

## 2024-02-14 NOTE — Progress Notes (Signed)
 Patient agitated because he was asked to take his medications in the presence of the nurse. He states meds have been left at bedside and I would not do that. He reluctantly took the medication.

## 2024-02-14 NOTE — Progress Notes (Signed)
 Mobility Specialist: Progress Note   02/14/24 1619  Mobility  Activity Ambulated independently in hallway  Level of Assistance Independent  Assistive Device None  Distance Ambulated (ft) 3000 ft  Activity Response Tolerated well  Mobility Referral Yes  Mobility visit 1 Mobility  Mobility Specialist Start Time (ACUTE ONLY) 1201  Mobility Specialist Stop Time (ACUTE ONLY) 1229  Mobility Specialist Time Calculation (min) (ACUTE ONLY) 28 min    Pt received in chair, agreeable to mobility session. No complaints. Returned to room without fault. Left ambulating in room with all needs met, call bell in reach.    Ileana Lute Mobility Specialist Please contact via SecureChat or Rehab office at [phone]

## 2024-02-15 DIAGNOSIS — I10 Essential (primary) hypertension: Secondary | ICD-10-CM | POA: Diagnosis not present

## 2024-02-15 DIAGNOSIS — E162 Hypoglycemia, unspecified: Secondary | ICD-10-CM | POA: Diagnosis not present

## 2024-02-15 DIAGNOSIS — I480 Paroxysmal atrial fibrillation: Secondary | ICD-10-CM | POA: Diagnosis not present

## 2024-02-15 DIAGNOSIS — E871 Hypo-osmolality and hyponatremia: Secondary | ICD-10-CM | POA: Diagnosis not present

## 2024-02-15 LAB — BASIC METABOLIC PANEL WITH GFR
Anion gap: 9 (ref 5–15)
BUN: 35 mg/dL — ABNORMAL HIGH (ref 8–23)
CO2: 22 mmol/L (ref 22–32)
Calcium: 9.5 mg/dL (ref 8.9–10.3)
Chloride: 107 mmol/L (ref 98–111)
Creatinine, Ser: 0.88 mg/dL (ref 0.61–1.24)
GFR, Estimated: >60 mL/min (ref >60)
Glucose, Bld: 91 mg/dL (ref 70–99)
Potassium: 4 mmol/L (ref 3.5–5.1)
Sodium: 138 mmol/L (ref 135–145)

## 2024-02-15 LAB — CBC
HCT: 36.9 % — ABNORMAL LOW (ref 39.0–52.0)
Hemoglobin: 12.2 g/dL — ABNORMAL LOW (ref 13.0–17.0)
MCH: 32.4 pg (ref 26.0–34.0)
MCHC: 33.1 g/dL (ref 30.0–36.0)
MCV: 98.1 fL (ref 80.0–100.0)
Platelets: 207 K/uL (ref 150–400)
RBC: 3.76 MIL/uL — ABNORMAL LOW (ref 4.22–5.81)
RDW: 17.1 % — ABNORMAL HIGH (ref 11.5–15.5)
WBC: 5.3 K/uL (ref 4.0–10.5)
nRBC: 0 % (ref 0.0–0.2)

## 2024-02-15 NOTE — TOC Progression Note (Signed)
 Transition of Care (TOC) - Progression Note    Patient Details  Name: Anthony A Riva Jr. MRN: [id number] Date of Birth: [birth year]  Transition of Care [facility]) CM/SW Contact  Tierrah Anastos A [place], LCSW Phone Number: 02/15/2024, 2:37 PM  Clinical Narrative:     CSW contacted Hydia at [facility], no answer, left message with staff with contact information to reach out to CSW.   CSW spoke with [name] at [facility], he stated that he was following up on cases in the community, but he would be in the office tomorrow and would reach out to CSW with possible placement lead for CSW to follow up on.  Placement pending, CSW will continue to follow.  Expected Discharge Plan: Skilled Nursing Facility Barriers to Discharge: Continued Medical Work up               Expected Discharge Plan and Services In-house Referral: Clinical Social Work     Living arrangements for the past 2 months: Homeless                                       Social Drivers of Health (SDOH) Interventions SDOH Screenings   Food Insecurity: Patient Unable To Answer (12/09/2023)  Housing: Patient Unable To Answer (12/09/2023)  Transportation Needs: Patient Unable To Answer (12/09/2023)  Utilities: Patient Unable To Answer (12/09/2023)  Depression (PHQ2-9): Medium Risk (10/16/2020)  Social Connections: Unknown (12/22/2023)  Tobacco Use: High Risk (12/08/2023)    Readmission Risk Interventions    01/14/2024    9:48 AM 08/27/2023   12:21 PM 08/19/2023    9:17 AM  Readmission Risk Prevention Plan  Transportation Screening Complete Complete Complete  Medication Review Ocean[employer]) Complete Complete Complete  PCP or Specialist appointment within 3-5 days of discharge Complete Complete   HRI or Home Care Consult Complete Complete Complete  SW Recovery Care/Counseling Consult Complete Complete Complete  Palliative Care Screening Not Applicable Not Applicable Not Applicable  Skilled Nursing Facility Not  Applicable Not Applicable Not Applicable

## 2024-02-15 NOTE — Progress Notes (Signed)
 Occupational Therapy Treatment Patient Details Name: Anthony A Lesmeister Jr. MRN: [id number] DOB: November 15, 1955 Today's Date: 02/15/2024   History of present illness Pt is a 68 y.o. male who presented 12/06/23 with SOB. Shortly after arriving, pt became agitated and was given IV Ativan . CT head is unremarkable. Patient admitted for EtOH withdrawal, acute delirium, and hypoglycemia. Cortrak placed 5/23 now discontinued.  The day before the patient had presented to ED requesting EtOH detox; patient was deemed to be not actively in EtOH withdrawal and was discharged, however upon discharge patient threatened staff and was escorted off of the premises by [employer]. PMH includes alcohol  abuse, HTN, CVA, HFmrEF, afib, PE, HLD, anxiety, depression, chronic low back pain.   OT comments  Patient has made great gains with OT treatment, meeting all goals. Patient able to perform mobility and self care independently in room and is able to ambulated extended distances without a seated rest break. OT to sign off.       If plan is discharge home, recommend the following:  Assist for transportation;Direct supervision/assist for medications management;A Viele help with walking and/or transfers;A Ned help with bathing/dressing/bathroom;Supervision due to cognitive status   Equipment Recommendations  Other (comment) (defer)    Recommendations for Other Services      Precautions / Restrictions Precautions Precautions: Fall Recall of Precautions/Restrictions: Impaired Precaution/Restrictions Comments: watch BP Restrictions Weight Bearing Restrictions Per Provider Order: No       Mobility Bed Mobility Overal bed mobility: Modified Independent                  Transfers Overall transfer level: Modified independent Equipment used: None               General transfer comment: patient ambulating in room independently     Balance Overall balance assessment: Mild deficits observed, not formally tested                                          ADL either performed or assessed with clinical judgement   ADL Overall ADL's : Needs assistance/impaired                 Upper Body Dressing : Modified independent;Standing Upper Body Dressing Details (indicate cue type and reason): donned shirt while standing without assistance Lower Body Dressing: Modified independent;Sit to/from stand Lower Body Dressing Details (indicate cue type and reason): able to perform LB dressing without assistance               General ADL Comments: Patient states he has been hand washing his clothes in the bathroom and hanging them to dry    Extremity/Trunk Assessment Upper Extremity Assessment Upper Extremity Assessment: Overall WFL for tasks assessed            Vision       Perception     Praxis     Communication Communication Communication: Impaired Factors Affecting Communication: Hearing impaired (mildly)   Cognition Arousal: Alert Behavior During Therapy: WFL for tasks assessed/performed Cognition: Cognition impaired     Awareness: Online awareness intact, Intellectual awareness intact       OT - Cognition Comments: some short term memory deficits                 Following commands: Impaired Following commands impaired: Follows multi-step commands with increased time      Cueing   Cueing  Techniques: Verbal cues  Exercises      Shoulder Instructions       General Comments performed mobility in hallway and outside with no seated rest break    Pertinent Vitals/ Pain       Pain Assessment Pain Assessment: Faces Faces Pain Scale: Hurts a Searing bit Pain Location: back (chronic) Pain Descriptors / Indicators: Discomfort, Sore Pain Intervention(s): Monitored during session  Home Living                                          Prior Functioning/Environment              Frequency  Min 2X/week        Progress Toward  Goals  OT Goals(current goals can now be found in the care plan section)  Progress towards OT goals: Progressing toward goals  Acute Rehab OT Goals Patient Stated Goal: to discharge to boarding house OT Goal Formulation: With patient Time For Goal Achievement: 02/16/24 Potential to Achieve Goals: Good ADL Goals Pt Will Perform Grooming: with modified independence;standing Pt Will Perform Lower Body Dressing: with modified independence;sit to/from stand Pt Will Transfer to Toilet: regular height toilet;ambulating;with modified independence Additional ADL Goal #1: pt will perform pillbox assessment mod I for increased time. Additional ADL Goal #2: Pt will complete a multistep ADL/iADL independently  Plan      Co-evaluation                 AM-PAC OT 6 Clicks Daily Activity     Outcome Measure   Help from another person eating meals?: None Help from another person taking care of personal grooming?: None Help from another person toileting, which includes using toliet, bedpan, or urinal?: None Help from another person bathing (including washing, rinsing, drying)?: None Help from another person to put on and taking off regular upper body clothing?: None Help from another person to put on and taking off regular lower body clothing?: None 6 Click Score: 24    End of Session    OT Visit Diagnosis: Other symptoms and signs involving cognitive function;Unsteadiness on feet (R26.81);Low vision, both eyes (H54.2)   Activity Tolerance Patient tolerated treatment well   Patient Left in chair;with call bell/phone within reach   Nurse Communication Mobility status        Time: 8875-8848 OT Time Calculation (min): 27 min  Charges: OT General Charges $OT Visit: 1 Visit OT Treatments $Self Care/Home Management : 8-22 mins $Therapeutic Activity: 8-22 mins  Dick [name], OTA Acute Rehabilitation Services  Office [phone]   Anthony [name] 02/15/2024, 2:22 PM

## 2024-02-15 NOTE — Plan of Care (Signed)

## 2024-02-15 NOTE — Progress Notes (Signed)
 Progress Note   Patient: Anthony [name]. FMW:[id number] DOB: 1955-11-28 DOA: 12/07/2023  DOS: the patient was seen and examined on 02/15/2024   Brief hospital course:  68 y.o. male with PMH significant for homelessness, chronic alcoholism c/b withdrawals/DTs, chronic smoking, HTN, paroxysmal A-fib, h/o PE previously on Eliquis , hiatal hernia s/p Nissen fundoplication 2016, chronic low back pain, chronic anxiety, opiate withdrawal presented to the ED for confusion, low blood sugar.   During this hospitalization, he has also been seen by critical care, CHF team, GI, neurology, psychiatry and palliative care.   During this admission , had intermittent agitation- requiring Ativan  per CIWA protocol, transferred to PCCM Patient was placed on Precedex , Librium , clonazepam ... Concluded treatment Patient was seen by GI, started on stress dose steroids.  Patient was also seen by advanced heart failure team for biventricular heart failure.     He was transferred to TRH service for further management of hypoglycemia, hyponatremia.   Per psychiatry consultation, patient lacks decision-making capacity, APS involved for placement.  However this has now resolved.  APS denied guardianship.  Pt making decisions.   Currently stable for DC, pending safe disposition.   Assessment and Plan:   SIRS with hypotension - Resolved.  Completed empiric 5-day course of Zosyn .   Orthostatic hypotension - Resolved after IV fluid hydration initiation of midodrine .  Compression stockings and abdominal binder on board as well.  Midodrine  10 mg 3 times daily since 7/17.  Acute metabolic encephalopathy secondary to alcohol  withdrawal - Previously in the ICU getting Precedex .  Vitamin supplementation.  Now resolved.   Hospital delirium - Appears resolved at this time, patient back to his baseline.   Retroperitoneal hematoma/chronic iron  deficiency anemia/acute blood loss anemia - S/p 5 units total RBCs during course of  hospital stay.  Hemoglobin stable.  No active bleeding.  Eliquis  resumed.   A-fib with RVR - Initially on Cardizem  drip.  Subsequently discontinued, transition to amiodarone  p.o. Eliquis .  Rate appears well-controlled at this time.   HFrEF - EF 30-40%.  GDMT limited by hypotension.   Hyponatremia - Responding well to urea  powder.  Sodium normal.   Hypokalemia/hypomagnesemia - Supplementation on board.  Checking intermittently.  Goals of care - Patient was homeless, TOC working on dispo discharge disposition. Per psychiatry 6/15 the patient lacks capacity for decision-making. However, pt is now very conversive, oriented, able to make his own decisions.   APS involved however now stating they cannot pursue guardianship.  Appears patient does not want to pursue ALF and stated he would walk out if placed there. Is willing to go back to the community either boarding house or similar. Working closely with TOC on disposition planning aligning with the patient's goals.  Please refer to [facility] note.   Subjective: Patient resting comfortably this morning.  No problems with orthostatic blood pressures.  Denies any fever, chills, chest pain, nausea, vomiting, abdominal pain.  Physical Exam:  Vitals:   02/14/24 2231 02/14/24 2358 02/15/24 0636 02/15/24 0840  BP: (!) 128/92 133/89 124/86 110/82  Pulse: (!) 101 96 94 85  Resp: 19 19 18    Temp: 98.2 F (36.8 C) 97.7 F (36.5 C) (!) 97.4 F (36.3 C) (!) 97.4 F (36.3 C)  TempSrc:   Oral   SpO2: 100% 99% 98% 100%  Weight:      Height:        GENERAL:  Alert, pleasant, no acute distress, disheveled HEENT:  EOMI CARDIOVASCULAR:  RRR, no murmurs appreciated RESPIRATORY:  Clear to  auscultation, no wheezing, rales, or rhonchi GASTROINTESTINAL:  Soft, nontender, nondistended EXTREMITIES:  No LE edema bilaterally NEURO:  No new focal deficits appreciated SKIN:  No rashes noted PSYCH:  Appropriate mood and affect     Data Reviewed:  Imaging  Studies: No results found.  There are no new results to review at this time.  Previous records (including but not limited to H&P, progress notes, nursing notes, TOC management) were reviewed in assessment of this patient.  Labs: CBC: Recent Labs  Lab 02/09/24 0232 02/14/24 0405 02/15/24 0430  WBC 5.9 4.1 5.3  NEUTROABS 2.0  --   --   HGB 12.0* 11.7* 12.2*  HCT 36.5* 35.4* 36.9*  MCV 98.9 98.6 98.1  PLT 203 200 207   Basic Metabolic Panel: Recent Labs  Lab 02/09/24 0232 02/14/24 0405 02/15/24 0430  NA 138 139 138  K 3.9 3.8 4.0  CL 107 109 107  CO2 22 21* 22  GLUCOSE 96 92 91  BUN 42* 44* 35*  CREATININE 0.78 0.86 0.88  CALCIUM  9.6 9.3 9.5   Liver Function Tests: No results for input(s): AST, ALT, ALKPHOS, BILITOT, PROT, ALBUMIN  in the last 168 hours. CBG: No results for input(s): GLUCAP in the last 168 hours.  Scheduled Meds:  amiodarone   200 mg Oral BID   apixaban   5 mg Oral BID   famotidine   20 mg Oral BID   feeding supplement  237 mL Oral BID BM   Gerhardt's butt cream   Topical BID   lidocaine   1 patch Transdermal Q24H   magnesium  chloride  2 tablet Oral Daily   midodrine   10 mg Oral TID WC   multivitamin with minerals  1 tablet Oral Daily   nutrition supplement (JUVEN)  1 packet Oral BID BM   urea   15 g Oral BID   Continuous Infusions: PRN Meds:.acetaminophen , hydrOXYzine , methocarbamol , naphazoline-glycerin , OLANZapine  zydis, ondansetron  (ZOFRAN ) IV, mouth rinse, senna-docusate  Family Communication: None at bedside  Disposition: Status is: Inpatient Remains inpatient appropriate because: Disposition     Time spent: 35 minutes  Length of inpatient stay: 69 days  Author: Carliss LELON Canales, DO 02/15/2024 12:09 PM  For on call review [email or web address].

## 2024-02-16 DIAGNOSIS — R338 Other retention of urine: Secondary | ICD-10-CM | POA: Diagnosis not present

## 2024-02-16 DIAGNOSIS — I480 Paroxysmal atrial fibrillation: Secondary | ICD-10-CM | POA: Diagnosis not present

## 2024-02-16 DIAGNOSIS — E871 Hypo-osmolality and hyponatremia: Secondary | ICD-10-CM | POA: Diagnosis not present

## 2024-02-16 DIAGNOSIS — E162 Hypoglycemia, unspecified: Secondary | ICD-10-CM | POA: Diagnosis not present

## 2024-02-16 LAB — CBC
HCT: 36.1 % — ABNORMAL LOW (ref 39.0–52.0)
Hemoglobin: 11.9 g/dL — ABNORMAL LOW (ref 13.0–17.0)
MCH: 32.2 pg (ref 26.0–34.0)
MCHC: 33 g/dL (ref 30.0–36.0)
MCV: 97.8 fL (ref 80.0–100.0)
Platelets: 217 K/uL (ref 150–400)
RBC: 3.69 MIL/uL — ABNORMAL LOW (ref 4.22–5.81)
RDW: 17 % — ABNORMAL HIGH (ref 11.5–15.5)
WBC: 5.4 K/uL (ref 4.0–10.5)
nRBC: 0 % (ref 0.0–0.2)

## 2024-02-16 LAB — BASIC METABOLIC PANEL WITH GFR
Anion gap: 8 (ref 5–15)
BUN: 43 mg/dL — ABNORMAL HIGH (ref 8–23)
CO2: 21 mmol/L — ABNORMAL LOW (ref 22–32)
Calcium: 9.6 mg/dL (ref 8.9–10.3)
Chloride: 108 mmol/L (ref 98–111)
Creatinine, Ser: 0.82 mg/dL (ref 0.61–1.24)
GFR, Estimated: >60 mL/min (ref >60)
Glucose, Bld: 85 mg/dL (ref 70–99)
Potassium: 3.9 mmol/L (ref 3.5–5.1)
Sodium: 137 mmol/L (ref 135–145)

## 2024-02-16 MED ORDER — TAMSULOSIN HCL 0.4 MG PO CAPS
0.4000 mg | ORAL_CAPSULE | Freq: Every day | ORAL | Status: DC
  Administered 2024-02-16 – 2024-02-28 (×14): 0.4 mg via ORAL
  Filled 2024-02-16 (×13): qty 1

## 2024-02-16 NOTE — Progress Notes (Signed)
 Mobility Specialist: Progress Note   02/16/24 1500  Mobility  Activity Ambulated independently  Level of Assistance Independent  Assistive Device None  Distance Ambulated (ft) 3000 ft  Activity Response Tolerated well  Mobility Referral Yes  Mobility visit 1 Mobility  Mobility Specialist Start Time (ACUTE ONLY) 1210  Mobility Specialist Stop Time (ACUTE ONLY) 1240  Mobility Specialist Time Calculation (min) (ACUTE ONLY) 30 min    Pt received in bed, agreeable to mobility session. No complaints. Took 1x seated break d/t fatigue. Returned to room without fault. Left ambulating in room with all needs met, call bell in reach.    Ileana Lute Mobility Specialist Please contact via SecureChat or Rehab office at [phone]

## 2024-02-16 NOTE — TOC Progression Note (Signed)
 Transition of Care (TOC) - Progression Note    Patient Details  Name: Anthony A Pizzi Jr. MRN: [id number] Date of Birth: 23-Dec-1955  Transition of Care [facility]) CM/SW Contact  Chyrl Elwell A [place], LCSW Phone Number: 02/16/2024, 12:56 PM  Clinical Narrative:     CSW left VM with [name] Squibb at [facility] for information regarding placement information.   CSW to continue to follow.   Expected Discharge Plan: Skilled Nursing Facility Barriers to Discharge: Continued Medical Work up               Expected Discharge Plan and Services In-house Referral: Clinical Social Work     Living arrangements for the past 2 months: Homeless                                       Social Drivers of Health (SDOH) Interventions SDOH Screenings   Food Insecurity: Patient Unable To Answer (12/09/2023)  Housing: Patient Unable To Answer (12/09/2023)  Transportation Needs: Patient Unable To Answer (12/09/2023)  Utilities: Patient Unable To Answer (12/09/2023)  Depression (PHQ2-9): Medium Risk (10/16/2020)  Social Connections: Unknown (12/22/2023)  Tobacco Use: High Risk (12/08/2023)    Readmission Risk Interventions    01/14/2024    9:48 AM 08/27/2023   12:21 PM 08/19/2023    9:17 AM  Readmission Risk Prevention Plan  Transportation Screening Complete Complete Complete  Medication Review Oceanographer) Complete Complete Complete  PCP or Specialist appointment within 3-5 days of discharge Complete Complete   HRI or Home Care Consult Complete Complete Complete  SW Recovery Care/Counseling Consult Complete Complete Complete  Palliative Care Screening Not Applicable Not Applicable Not Applicable  Skilled Nursing Facility Not Applicable Not Applicable Not Applicable

## 2024-02-16 NOTE — Progress Notes (Signed)
 Progress Note   Patient: Anthony [name]. FMW:[id number] DOB: 03/09/1956 DOA: 12/07/2023  DOS: the patient was seen and examined on 02/16/2024   Brief hospital course:  68 y.o. male with PMH significant for homelessness, chronic alcoholism c/b withdrawals/DTs, chronic smoking, HTN, paroxysmal A-fib, h/o PE previously on Eliquis , hiatal hernia s/p Nissen fundoplication 2016, chronic low back pain, chronic anxiety, opiate withdrawal presented to the ED for confusion, low blood sugar.   During this hospitalization, he has also been seen by critical care, CHF team, GI, neurology, psychiatry and palliative care.   During this admission , had intermittent agitation- requiring Ativan  per CIWA protocol, transferred to PCCM Patient was placed on Precedex , Librium , clonazepam ... Concluded treatment Patient was seen by GI, started on stress dose steroids.  Patient was also seen by advanced heart failure team for biventricular heart failure.     He was transferred to TRH service for further management of hypoglycemia, hyponatremia.   Per psychiatry consultation, patient lacks decision-making capacity, APS involved for placement.  However this has now resolved.  APS denied guardianship.  Pt making decisions.   Currently stable for DC, pending safe disposition.   Assessment and Plan:   SIRS with hypotension - Resolved.  Completed empiric 5-day course of Zosyn .   Orthostatic hypotension - Resolved after IV fluid hydration initiation of midodrine .  Compression stockings and abdominal binder on board as well.  Midodrine  10 mg 3 times daily since 7/17.  Acute metabolic encephalopathy secondary to alcohol  withdrawal - Previously in the ICU getting Precedex .  Vitamin supplementation.  Now resolved.   Hospital delirium - Appears resolved at this time, patient back to his baseline.   Retroperitoneal hematoma/chronic iron  deficiency anemia/acute blood loss anemia - S/p 5 units total RBCs during course of  hospital stay.  Hemoglobin stable.  No active bleeding.  Eliquis  resumed.   A-fib with RVR - Initially on Cardizem  drip.  Subsequently discontinued, transition to amiodarone  p.o. Eliquis .  Rate appears well-controlled at this time.   HFrEF - EF 30-40%.  GDMT limited by hypotension.   Hyponatremia - Responding well to urea  powder.  Sodium normal.   Hypokalemia/hypomagnesemia - Supplementation on board.  Checking intermittently.  Acute urinary retention - Patient complaining of going to bathroom multiple times with only small volume of urine.  Believes he may have a problem with his prostate.  Will empirically place on Flomax .  Monitor blood pressure closely.  Goals of care - Patient was homeless, TOC working on dispo discharge disposition. Per psychiatry 6/15 the patient lacks capacity for decision-making. However, pt is now very conversive, oriented, able to make his own decisions.   APS involved however now stating they cannot pursue guardianship.  Appears patient does not want to pursue ALF and stated he would walk out if placed there. Is willing to go back to the community either boarding house or similar. Working closely with TOC on disposition planning aligning with the patient's goals.  Please refer to [facility] note.   Subjective: Patient resting comfortably this morning.  No problems with orthostatic blood pressures.  Denies any fever, chills, chest pain, nausea, vomiting, abdominal pain.  Does admit to having intermittent urge incontinence and small volume voids.  No burning or fever.  Physical Exam:  Vitals:   02/15/24 0840 02/15/24 1218 02/15/24 2032 02/16/24 0807  BP: 110/82 110/69 116/73 120/87  Pulse: 85 95 86 93  Resp:   16 17  Temp: (!) 97.4 F (36.3 C) 98.2 F (36.8 C) (!) 97.5  F (36.4 C) 98.7 F (37.1 C)  TempSrc:   Oral Oral  SpO2: 100% 98% 100% 100%  Weight:      Height:        GENERAL:  Alert, pleasant, no acute distress, disheveled HEENT:   EOMI CARDIOVASCULAR:  RRR, no murmurs appreciated RESPIRATORY:  Clear to auscultation, no wheezing, rales, or rhonchi GASTROINTESTINAL:  Soft, nontender, nondistended EXTREMITIES:  No LE edema bilaterally NEURO:  No new focal deficits appreciated SKIN:  No rashes noted PSYCH:  Appropriate mood and affect     Data Reviewed:  Imaging Studies: No results found.  There are no new results to review at this time.  Previous records (including but not limited to H&P, progress notes, nursing notes, TOC management) were reviewed in assessment of this patient.  Labs: CBC: Recent Labs  Lab 02/14/24 0405 02/15/24 0430 02/16/24 0234  WBC 4.1 5.3 5.4  HGB 11.7* 12.2* 11.9*  HCT 35.4* 36.9* 36.1*  MCV 98.6 98.1 97.8  PLT 200 207 217   Basic Metabolic Panel: Recent Labs  Lab 02/14/24 0405 02/15/24 0430 02/16/24 0234  NA 139 138 137  K 3.8 4.0 3.9  CL 109 107 108  CO2 21* 22 21*  GLUCOSE 92 91 85  BUN 44* 35* 43*  CREATININE 0.86 0.88 0.82  CALCIUM  9.3 9.5 9.6   Liver Function Tests: No results for input(s): AST, ALT, ALKPHOS, BILITOT, PROT, ALBUMIN  in the last 168 hours. CBG: No results for input(s): GLUCAP in the last 168 hours.  Scheduled Meds:  amiodarone   200 mg Oral BID   apixaban   5 mg Oral BID   famotidine   20 mg Oral BID   feeding supplement  237 mL Oral BID BM   Gerhardt's butt cream   Topical BID   lidocaine   1 patch Transdermal Q24H   magnesium  chloride  2 tablet Oral Daily   midodrine   10 mg Oral TID WC   multivitamin with minerals  1 tablet Oral Daily   nutrition supplement (JUVEN)  1 packet Oral BID BM   tamsulosin   0.4 mg Oral Daily   urea   15 g Oral BID   Continuous Infusions: PRN Meds:.acetaminophen , hydrOXYzine , methocarbamol , naphazoline-glycerin , OLANZapine  zydis, ondansetron  (ZOFRAN ) IV, mouth rinse, senna-docusate  Family Communication: None at bedside  Disposition: Status is: Inpatient Remains inpatient appropriate because:  Disposition     Time spent: 34 minutes  Length of inpatient stay: 70 days  Author: Carliss LELON Canales, DO 02/16/2024 11:58 AM  For on call review [email or web address].

## 2024-02-17 DIAGNOSIS — I480 Paroxysmal atrial fibrillation: Secondary | ICD-10-CM | POA: Diagnosis not present

## 2024-02-17 DIAGNOSIS — R338 Other retention of urine: Secondary | ICD-10-CM | POA: Diagnosis not present

## 2024-02-17 DIAGNOSIS — E871 Hypo-osmolality and hyponatremia: Secondary | ICD-10-CM | POA: Diagnosis not present

## 2024-02-17 DIAGNOSIS — I10 Essential (primary) hypertension: Secondary | ICD-10-CM | POA: Diagnosis not present

## 2024-02-17 NOTE — TOC Progression Note (Addendum)
 Transition of Care (TOC) - Progression Note    Patient Details  Name: Anthony A Nelis Jr. MRN: [id number] Date of Birth: 03-Jan-1956  Transition of Care [facility]) CM/SW Contact  Anthony Nigh A [place], LCSW Phone Number: 02/17/2024, 10:52 AM  Clinical Narrative:     1501 CSW was contacted by Hildreth at [facility]. CSW spoke with pt and her via phone at pt's bedside. She informed pt of possible room for rent at $185/week. Pt was agreeable. CSW to follow up with [name], make another report and have his case reopened to assist with housing at DC.  Pt stated that if housing option does not work out, then he would be ok to DC to [employer] and navigate his housing needs from there. Requested toiletry items and clothing if possible. CSW to reach out to [name], make report and confirm housing information to finalize with pt and proceed from there.    CSW left VM with [name] Squibb at [facility] for information regarding placement information.     Expected Discharge Plan: Skilled Nursing Facility Barriers to Discharge: Continued Medical Work up               Expected Discharge Plan and Services In-house Referral: Clinical Social Work     Living arrangements for the past 2 months: Homeless                                       Social Drivers of Health (SDOH) Interventions SDOH Screenings   Food Insecurity: Patient Unable To Answer (12/09/2023)  Housing: Patient Unable To Answer (12/09/2023)  Transportation Needs: Patient Unable To Answer (12/09/2023)  Utilities: Patient Unable To Answer (12/09/2023)  Depression (PHQ2-9): Medium Risk (10/16/2020)  Social Connections: Unknown (12/22/2023)  Tobacco Use: High Risk (12/08/2023)    Readmission Risk Interventions    01/14/2024    9:48 AM 08/27/2023   12:21 PM 08/19/2023    9:17 AM  Readmission Risk Prevention Plan  Transportation Screening Complete Complete Complete  Medication Review Oceanographer) Complete Complete Complete  PCP or Specialist  appointment within 3-5 days of discharge Complete Complete   HRI or Home Care Consult Complete Complete Complete  SW Recovery Care/Counseling Consult Complete Complete Complete  Palliative Care Screening Not Applicable Not Applicable Not Applicable  Skilled Nursing Facility Not Applicable Not Applicable Not Applicable

## 2024-02-17 NOTE — Plan of Care (Signed)
  Problem: Education: Goal: Knowledge of General Education information will improve Description: Including pain rating scale, medication(s)/side effects and non-pharmacologic comfort measures Outcome: Adequate for Discharge   Problem: Health Behavior/Discharge Planning: Goal: Ability to manage health-related needs will improve Outcome: Adequate for Discharge   Problem: Clinical Measurements: Goal: Ability to maintain clinical measurements within normal limits will improve Outcome: Adequate for Discharge Goal: Will remain free from infection Outcome: Adequate for Discharge   Problem: Nutrition: Goal: Adequate nutrition will be maintained Outcome: Adequate for Discharge   Problem: Elimination: Goal: Will not experience complications related to bowel motility Outcome: Adequate for Discharge

## 2024-02-17 NOTE — Progress Notes (Signed)
 Mobility Specialist: Progress Note   02/17/24 1235  Mobility  Activity Ambulated independently  Level of Assistance Independent  Assistive Device None  Distance Ambulated (ft) 3000 ft  Activity Response Tolerated well  Mobility Referral Yes  Mobility visit 1 Mobility  Mobility Specialist Start Time (ACUTE ONLY) 1205  Mobility Specialist Stop Time (ACUTE ONLY) 1235  Mobility Specialist Time Calculation (min) (ACUTE ONLY) 30 min    Pt received on EOB, agreeable to mobility session. No complaints. Returned to room without fault. Left in chair with all needs met, call bell in reach.    Ileana Lute Mobility Specialist Please contact via SecureChat or Rehab office at [phone]

## 2024-02-17 NOTE — TOC Progression Note (Signed)
 Transition of Care (TOC) - Progression Note    Patient Details  Name: Anthony A Westergard Jr. MRN: [id number] Date of Birth: 02/24/1956  Transition of Care [facility]) CM/SW Contact  Magdaleno Lortie A [place], LCSW Phone Number: 02/17/2024, 3:23 PM  Clinical Narrative:     CSW contacted [facility] to complete APS report with Myrick Sick with intake for pt's report for housing assistance. CSW provided contact information for CSW to be reached back out to with update on report.   CSW will continue to follow.   Expected Discharge Plan: Skilled Nursing Facility Barriers to Discharge: Continued Medical Work up               Expected Discharge Plan and Services In-house Referral: Clinical Social Work     Living arrangements for the past 2 months: Homeless                                       Social Drivers of Health (SDOH) Interventions SDOH Screenings   Food Insecurity: Patient Unable To Answer (12/09/2023)  Housing: Patient Unable To Answer (12/09/2023)  Transportation Needs: Patient Unable To Answer (12/09/2023)  Utilities: Patient Unable To Answer (12/09/2023)  Depression (PHQ2-9): Medium Risk (10/16/2020)  Social Connections: Unknown (12/22/2023)  Tobacco Use: High Risk (12/08/2023)    Readmission Risk Interventions    01/14/2024    9:48 AM 08/27/2023   12:21 PM 08/19/2023    9:17 AM  Readmission Risk Prevention Plan  Transportation Screening Complete Complete Complete  Medication Review Oceanographer) Complete Complete Complete  PCP or Specialist appointment within 3-5 days of discharge Complete Complete   HRI or Home Care Consult Complete Complete Complete  SW Recovery Care/Counseling Consult Complete Complete Complete  Palliative Care Screening Not Applicable Not Applicable Not Applicable  Skilled Nursing Facility Not Applicable Not Applicable Not Applicable

## 2024-02-17 NOTE — Progress Notes (Signed)
 Progress Note   Patient: Anthony A Kretchmer Jr. FMW:[id number] DOB: 02/23/1956 DOA: 12/07/2023  DOS: the patient was seen and examined on 02/17/2024   Brief hospital course:  68 y.o. male with PMH significant for homelessness, chronic alcoholism c/b withdrawals/DTs, chronic smoking, HTN, paroxysmal A-fib, h/o PE previously on Eliquis , hiatal hernia s/p Nissen fundoplication 2016, chronic low back pain, chronic anxiety, opiate withdrawal presented to the ED for confusion, low blood sugar.   During this hospitalization, he has also been seen by critical care, CHF team, GI, neurology, psychiatry and palliative care.   During this admission , had intermittent agitation- requiring Ativan  per CIWA protocol, transferred to [facility] Patient was placed on Precedex , Librium , clonazepam ... Concluded treatment Patient was seen by GI, started on stress dose steroids.  Patient was also seen by advanced heart failure team for biventricular heart failure.     He was transferred to TRH service for further management of hypoglycemia, hyponatremia.   Per psychiatry consultation, patient lacks decision-making capacity, APS involved for placement.  However this has now resolved.  APS denied guardianship.  Pt making decisions.   Currently stable for DC, pending safe disposition.   Assessment and Plan:   SIRS with hypotension - Resolved.  Completed empiric 5-day course of Zosyn .   Orthostatic hypotension - Resolved after IV fluid hydration initiation of midodrine .  Compression stockings and abdominal binder on board as well.  Midodrine  10 mg 3 times daily since 7/17.  Acute metabolic encephalopathy secondary to alcohol  withdrawal - Previously in the ICU getting Precedex .  Vitamin supplementation.  Now resolved.   Hospital delirium - Appears resolved at this time, patient back to his baseline.   Retroperitoneal hematoma/chronic iron  deficiency anemia/acute blood loss anemia - S/p 5 units total RBCs during course of  hospital stay.  Hemoglobin stable.  No active bleeding.  Eliquis  resumed.   A-fib with RVR - Initially on Cardizem  drip.  Subsequently discontinued, transition to amiodarone  p.o. Eliquis .  Rate appears well-controlled at this time.   HFrEF - EF 30-40%.  GDMT limited by hypotension.   Hyponatremia - Responding well to urea  powder.  Sodium normal.   Hypokalemia/hypomagnesemia - Supplementation on board.  Checking intermittently.  Acute urinary retention - Patient complaining of going to bathroom multiple times with only small volume of urine.  Believes he may have a problem with his prostate.  Initiated on 7/30.  Monitor response and blood pressure closely.  Goals of care - Patient was homeless, TOC working on dispo discharge disposition. Per psychiatry 6/15 the patient lacks capacity for decision-making. However, pt is now very conversive, oriented, able to make his own decisions.   APS involved however now stating they cannot pursue guardianship.  Appears patient does not want to pursue ALF and stated he would walk out if placed there. Is willing to go back to the community either boarding house or similar. Working closely with TOC on disposition planning aligning with the patient's goals.  Please refer to [facility] note.   Subjective: Patient resting comfortably this morning.  No problems with orthostatic blood pressures.  Denies any fever, chills, chest pain, nausea, vomiting, abdominal pain.    Physical Exam:  Vitals:   02/16/24 2044 02/17/24 0012 02/17/24 0434 02/17/24 0741  BP: (!) 135/91 102/80 (!) 137/91 (!) 109/94  Pulse: 100 (!) 104 100 (!) 106  Resp: 16 16 16    Temp: 99 F (37.2 C) 97.9 F (36.6 C) 97.9 F (36.6 C) 97.9 F (36.6 C)  TempSrc: Oral Oral Oral  Oral  SpO2: 100% 99% 98% 98%  Weight:      Height:        GENERAL:  Alert, pleasant, no acute distress, disheveled HEENT:  EOMI CARDIOVASCULAR:  RRR, no murmurs appreciated RESPIRATORY:  Clear to auscultation, no  wheezing, rales, or rhonchi GASTROINTESTINAL:  Soft, nontender, nondistended EXTREMITIES:  No LE edema bilaterally NEURO:  No new focal deficits appreciated SKIN:  No rashes noted PSYCH:  Appropriate mood and affect     Data Reviewed:  Imaging Studies: No results found.  There are no new results to review at this time.  Previous records (including but not limited to H&P, progress notes, nursing notes, TOC management) were reviewed in assessment of this patient.  Labs: CBC: Recent Labs  Lab 02/14/24 0405 02/15/24 0430 02/16/24 0234  WBC 4.1 5.3 5.4  HGB 11.7* 12.2* 11.9*  HCT 35.4* 36.9* 36.1*  MCV 98.6 98.1 97.8  PLT 200 207 217   Basic Metabolic Panel: Recent Labs  Lab 02/14/24 0405 02/15/24 0430 02/16/24 0234  NA 139 138 137  K 3.8 4.0 3.9  CL 109 107 108  CO2 21* 22 21*  GLUCOSE 92 91 85  BUN 44* 35* 43*  CREATININE 0.86 0.88 0.82  CALCIUM  9.3 9.5 9.6   Liver Function Tests: No results for input(s): AST, ALT, ALKPHOS, BILITOT, PROT, ALBUMIN  in the last 168 hours. CBG: No results for input(s): GLUCAP in the last 168 hours.  Scheduled Meds:  amiodarone   200 mg Oral BID   apixaban   5 mg Oral BID   famotidine   20 mg Oral BID   feeding supplement  237 mL Oral BID BM   Gerhardt's butt cream   Topical BID   lidocaine   1 patch Transdermal Q24H   magnesium  chloride  2 tablet Oral Daily   midodrine   10 mg Oral TID WC   multivitamin with minerals  1 tablet Oral Daily   nutrition supplement (JUVEN)  1 packet Oral BID BM   tamsulosin   0.4 mg Oral Daily   urea   15 g Oral BID   Continuous Infusions: PRN Meds:.acetaminophen , hydrOXYzine , methocarbamol , naphazoline-glycerin , OLANZapine  zydis, ondansetron  (ZOFRAN ) IV, mouth rinse, senna-docusate  Family Communication: None at bedside  Disposition: Status is: Inpatient Remains inpatient appropriate because: Disposition     Time spent: 36 minutes  Length of inpatient stay: 71  days  Author: Carliss LELON Canales, DO 02/17/2024 11:58 AM  For on call review [email or web address].

## 2024-02-18 DIAGNOSIS — D539 Nutritional anemia, unspecified: Secondary | ICD-10-CM | POA: Diagnosis not present

## 2024-02-18 DIAGNOSIS — E871 Hypo-osmolality and hyponatremia: Secondary | ICD-10-CM | POA: Diagnosis not present

## 2024-02-18 DIAGNOSIS — I480 Paroxysmal atrial fibrillation: Secondary | ICD-10-CM | POA: Diagnosis not present

## 2024-02-18 DIAGNOSIS — E162 Hypoglycemia, unspecified: Secondary | ICD-10-CM | POA: Diagnosis not present

## 2024-02-18 NOTE — Plan of Care (Signed)
   Problem: Education: Goal: Knowledge of General Education information will improve Description Including pain rating scale, medication(s)/side effects and non-pharmacologic comfort measures Outcome: Progressing

## 2024-02-18 NOTE — TOC Progression Note (Signed)
 Transition of Care (TOC) - Progression Note    Patient Details  Name: Anthony A Radice Jr. MRN: [id number] Date of Birth: 1955-12-29  Transition of Care [facility]) CM/SW Contact  Ramani Riva A [place], LCSW Phone Number: 02/18/2024, 3:10 PM  Clinical Narrative:     CSW spoke with [name] at [employer] social services. He stated that he spoke with pt and pt was agreeable for possible placement to facility recommendation at group home, [facility]. He said that if pt was unable to get into group home, pt said he knew how to survive and felt ok to go to [employer] and continue to look for placement in the community. [name] at DSS informed CSW that he was ok with pt's plan and was agreeable that pt could navigate in community and would not need to remain in the hospital until placement was coordinated. MD notified.   CSW reached out to group home at [phone] and left VM with contact information to reach out to CSW.  CSW followed up with pt, currently NOT  ok [facility] if unable to get into group home. MD updated.   Reached out to Inpatient case management leadership, notified. Contacted [facility], may see pt and review over the weekend for placement along with clinicals.   CSW will continue to follow.    Expected Discharge Plan: Skilled Nursing Facility Barriers to Discharge: Continued Medical Work up               Expected Discharge Plan and Services In-house Referral: Clinical Social Work     Living arrangements for the past 2 months: Homeless                                       Social Drivers of Health (SDOH) Interventions SDOH Screenings   Food Insecurity: Patient Unable To Answer (12/09/2023)  Housing: Patient Unable To Answer (12/09/2023)  Transportation Needs: Patient Unable To Answer (12/09/2023)  Utilities: Patient Unable To Answer (12/09/2023)  Depression (PHQ2-9): Medium Risk (10/16/2020)  Social Connections: Unknown (12/22/2023)   Tobacco Use: High Risk (12/08/2023)    Readmission Risk Interventions    01/14/2024    9:48 AM 08/27/2023   12:21 PM 08/19/2023    9:17 AM  Readmission Risk Prevention Plan  Transportation Screening Complete Complete Complete  Medication Review Oceanographer) Complete Complete Complete  PCP or Specialist appointment within 3-5 days of discharge Complete Complete   HRI or Home Care Consult Complete Complete Complete  SW Recovery Care/Counseling Consult Complete Complete Complete  Palliative Care Screening Not Applicable Not Applicable Not Applicable  Skilled Nursing Facility Not Applicable Not Applicable Not Applicable

## 2024-02-18 NOTE — Progress Notes (Signed)
 Progress Note   Patient: Anthony A Dorgan Jr. FMW:[id number] DOB: [birth year] DOA: 12/07/2023     72 DOS: the patient was seen and examined on 02/18/2024   Brief hospital course: 68 y.o. male with PMH significant for homelessness, chronic alcoholism c/b withdrawals/DTs, chronic smoking, HTN, paroxysmal A-fib, h/o PE previously on Eliquis , hiatal hernia s/p Nissen fundoplication 2016, chronic low back pain, chronic anxiety, opiate withdrawal presented to the ED for confusion, low blood sugar.   During this hospitalization, he has also been seen by critical care, CHF team, GI, neurology, psychiatry and palliative care.   During this admission , had intermittent agitation- requiring Ativan  per CIWA protocol, transferred to [facility] Patient was placed on Precedex , Librium , clonazepam ... Concluded treatment Patient was seen by GI, started on stress dose steroids.  Patient was also seen by advanced heart failure team for biventricular heart failure.     He was transferred to TRH service for further management of hypoglycemia, hyponatremia.   Per psychiatry consultation, patient lacks decision-making capacity, APS involved for placement.  However this has now resolved.  APS denied guardianship.  Pt making decisions.   Currently stable for DC, pending safe disposition.   Assessment and Plan:   SIRS with hypotension Resolved.  Completed empiric 5-day course of Zosyn .   Orthostatic hypotension Resolved after IV fluid hydration initiation of midodrine .  Compression stockings and abdominal binder on board as well.  Midodrine  10 mg 3 times daily since 7/17.   Acute metabolic encephalopathy secondary to alcohol  withdrawal Off Precedex .  Vitamin supplementation.  Now resolved.   Hospital delirium Appears resolved at this time, patient back to his baseline.   Retroperitoneal hematoma/chronic iron  deficiency anemia/acute blood loss anemia S/p 5 units total RBCs during course of hospital stay.  Hemoglobin  stable.  No active bleeding.  Eliquis  resumed.   A-fib with RVR Initially on Cardizem  drip.  Subsequently discontinued, transition to amiodarone  p.o. Eliquis .  Rate appears well-controlled at this time.   HFrEF EF 30-40%.  GDMT limited by hypotension.   Hyponatremia Responding well to urea  powder.  Sodium normal.   Hypokalemia/hypomagnesemia Supplementation on board.  Checking intermittently.   Acute urinary retention - Patient complaining of going to bathroom multiple times with only small volume of urine.  Believes he may have a problem with his prostate. Flomax  initiated on 7/30.  Monitor response and blood pressure closely.   Goals of care Patient was homeless, TOC working on dispo discharge disposition. Per psychiatry 6/15 the patient lacks capacity for decision-making. However, pt is now very conversive, oriented, able to make his own decisions.   APS involved however now stating they cannot pursue guardianship.  Appears patient does not want to pursue ALF and stated he would walk out if placed there. Is willing to go back to the community either boarding house or similar. He wishes to go to group home vs homeless shelter.     Out of bed to chair. Incentive spirometry. Nursing supportive care. Fall, aspiration precautions. Diet:  Diet Orders (From admission, onward)     Start     Ordered   01/06/24 1359  Diet regular Room service appropriate? Yes; Fluid consistency: Thin  Diet effective now       Question Answer Comment  Room service appropriate? Yes   Fluid consistency: Thin      01/06/24 1358           DVT prophylaxis: Place and maintain sequential compression device Start: 01/30/24 1209 Place TED hose Start: 01/30/24 1209  apixaban  (ELIQUIS ) tablet 5 mg  Level of care: Med-Surg   Code Status: Limited: Do not attempt resuscitation (DNR) -DNR-LIMITED -Do Not Intubate/DNI   Subjective: Patient is seen and examined today morning. He is working with PT. No  complaints.  Physical Exam: Vitals:   02/18/24 0130 02/18/24 0453 02/18/24 0752 02/18/24 1245  BP: (!) 142/86 (!) 127/90 119/86 110/77  Pulse: 84 90 93 96  Resp:      Temp: (!) 97.4 F (36.3 C) 98 F (36.7 C) (!) 97.5 F (36.4 C) 98.7 F (37.1 C)  TempSrc:   Oral Oral  SpO2: 98% 98% 97% 98%  Weight:      Height:        General - Elderly Caucasian male, no apparent distress HEENT - PERRLA, EOMI, atraumatic head, non tender sinuses. Lung - Clear, basal rales, rhonchi, no wheezes. Heart - S1, S2 heard, no murmurs, rubs, trace pedal edema. Abdomen - Soft, non tender, bowel sounds good Neuro - Alert, awake and oriented x 3, non focal exam. Skin - Warm and dry.  Data Reviewed:      Latest Ref Rng & Units 02/16/2024    2:34 AM 02/15/2024    4:30 AM 02/14/2024    4:05 AM  CBC  WBC 4.0 - 10.5 K/uL 5.4  5.3  4.1   Hemoglobin 13.0 - 17.0 g/dL 88.0  87.7  88.2   Hematocrit 39.0 - 52.0 % 36.1  36.9  35.4   Platelets 150 - 400 K/uL 217  207  200       Latest Ref Rng & Units 02/16/2024    2:34 AM 02/15/2024    4:30 AM 02/14/2024    4:05 AM  BMP  Glucose 70 - 99 mg/dL 85  91  92   BUN 8 - 23 mg/dL 43  35  44   Creatinine 0.61 - 1.24 mg/dL 9.17  9.11  9.13   Sodium 135 - 145 mmol/L 137  138  139   Potassium 3.5 - 5.1 mmol/L 3.9  4.0  3.8   Chloride 98 - 111 mmol/L 108  107  109   CO2 22 - 32 mmol/L 21  22  21    Calcium  8.9 - 10.3 mg/dL 9.6  9.5  9.3    No results found.  Family Communication: Discussed with patient, he understand and agree. All questions answered.  Disposition: Status is: Inpatient Remains inpatient appropriate because: awaiting safe placement  Planned Discharge Destination: group home/ shelter     Time spent: 39 minutes  Author: Concepcion Riser, MD 02/18/2024 3:34 PM Secure chat 7am to 7pm For on call review [email or web address].

## 2024-02-18 NOTE — Progress Notes (Signed)
 Mobility Specialist: Progress Note   02/18/24 1200  Mobility  Activity Ambulated independently  Level of Assistance Independent  Assistive Device None  Distance Ambulated (ft) 3000 ft  Activity Response Tolerated well  Mobility Referral Yes  Mobility visit 1 Mobility  Mobility Specialist Start Time (ACUTE ONLY) 1120  Mobility Specialist Stop Time (ACUTE ONLY) 1156  Mobility Specialist Time Calculation (min) (ACUTE ONLY) 36 min    Pt received in bed, agreeable to mobility session. Ind throughout. Getting frustrated with hospital staff and MS this session. There aren't any unclaimed clothes available in the ER lost and found that he can have; pt was very irritated, cursing and stating that somebody told him he could have some clothes and he has been able to get unclaimed ER clothes previously. Pt had calmed down by the time he reached his room. Left ambulating in room with all needs met, call bell in reach.   Ileana Lute Mobility Specialist Please contact via SecureChat or Rehab office at [phone]

## 2024-02-19 DIAGNOSIS — E871 Hypo-osmolality and hyponatremia: Secondary | ICD-10-CM | POA: Diagnosis not present

## 2024-02-19 DIAGNOSIS — E162 Hypoglycemia, unspecified: Secondary | ICD-10-CM | POA: Diagnosis not present

## 2024-02-19 DIAGNOSIS — D539 Nutritional anemia, unspecified: Secondary | ICD-10-CM | POA: Diagnosis not present

## 2024-02-19 DIAGNOSIS — I480 Paroxysmal atrial fibrillation: Secondary | ICD-10-CM | POA: Diagnosis not present

## 2024-02-19 NOTE — Progress Notes (Signed)
 Mobility Specialist Progress Note:   02/19/24 1430  Mobility  Activity Ambulated independently  Level of Assistance Independent  Assistive Device None  Distance Ambulated (ft) 3000 ft  Activity Response Tolerated well  Mobility Referral Yes  Mobility visit 1 Mobility  Mobility Specialist Start Time (ACUTE ONLY) 1430  Mobility Specialist Stop Time (ACUTE ONLY) 1450  Mobility Specialist Time Calculation (min) (ACUTE ONLY) 20 min   Pt eager for mobility session. Ambulated outside with no difficulties throughout. Back in room with all needs met.   Therisa Rana Mobility Specialist Please contact via SecureChat or  Rehab office at [phone]

## 2024-02-19 NOTE — Plan of Care (Signed)

## 2024-02-19 NOTE — Progress Notes (Signed)
 Progress Note   Patient: Anthony A Vig Jr. FMW:[id number] DOB: 10-26-1955 DOA: 12/07/2023     73 DOS: the patient was seen and examined on 02/19/2024   Brief hospital course: Brizuela Russel is a 68 y.o. male with PMH significant for homelessness, chronic alcoholism c/b withdrawals/DTs, chronic smoking, HTN, paroxysmal A-fib, h/o PE previously on Eliquis , hiatal hernia s/p Nissen fundoplication 2016, chronic low back pain, chronic anxiety, opiate withdrawal presented to the ED for confusion, low blood sugar.   During this hospitalization, he has also been seen by critical care, CHF team, GI, neurology, psychiatry and palliative care.   During this admission, he had intermittent agitation- requiring Ativan  per CIWA protocol, transferred to [facility] Patient was placed on Precedex , Librium , clonazepam ... Concluded treatment Patient was seen by GI, started on stress dose steroids.  Patient was also seen by advanced heart failure team for biventricular heart failure.     He was transferred to TRH service for further management of hypoglycemia, hyponatremia.   Per psychiatry consultation, patient lacks decision-making capacity, APS involved for placement.  However this has now resolved.  APS denied guardianship.  Pt making his own decisions. Currently stable for DC, pending safe disposition. He wishes to go to group home vs homeless shelter.   Assessment and Plan: SIRS with hypotension Resolved.  Completed empiric 5-day course of Zosyn .   Orthostatic hypotension Resolved after IV fluid hydration initiation of midodrine .  Compression stockings and abdominal binder on board as well.  Midodrine  10 mg 3 times daily since 7/17.   Acute metabolic encephalopathy secondary to alcohol  withdrawal Off Precedex .  Vitamin supplementation.  Now resolved.   Hospital delirium Resolved at this time, patient back to his baseline.   Retroperitoneal hematoma/chronic iron  deficiency anemia/acute blood loss anemia S/p 5  units total RBCs during course of hospital stay.  Hemoglobin stable.  No active bleeding.  Eliquis  resumed.   A-fib with RVR Initially on Cardizem  drip.  Subsequently discontinued, transition to amiodarone  p.o. Eliquis .  Rate appears well-controlled at this time.   HFrEF EF 30-40%.  GDMT limited by hypotension.   Hyponatremia Responding well to urea  powder.  Sodium normal.   Hypokalemia/hypomagnesemia Supplementation on board.  Checking intermittently.   Acute urinary retention Flomax  initiated on 7/30.  Monitor response and blood pressure closely.   Goals of care Patient was homeless, TOC working on dispo discharge disposition. Per psychiatry 6/15 the patient lacks capacity for decision-making. However, pt is now very conversive, oriented, able to make his own decisions.   APS involved however now stating they cannot pursue guardianship.  Appears patient does not want to pursue ALF and stated he would walk out if placed there. Is willing to go back to the community either boarding house or similar. He wishes to go to group home vs homeless shelter.     Out of bed to chair. Incentive spirometry. Nursing supportive care. Fall, aspiration precautions. Diet:  Diet Orders (From admission, onward)     Start     Ordered   01/06/24 1359  Diet regular Room service appropriate? Yes; Fluid consistency: Thin  Diet effective now       Question Answer Comment  Room service appropriate? Yes   Fluid consistency: Thin      01/06/24 1358           DVT prophylaxis: Place and maintain sequential compression device Start: 01/30/24 1209 Place TED hose Start: 01/30/24 1209 apixaban  (ELIQUIS ) tablet 5 mg  Level of care: Med-Surg   Code Status:  Limited: Do not attempt resuscitation (DNR) -DNR-LIMITED -Do Not Intubate/DNI   Subjective: Patient is seen and examined today morning. He is lying comfortably. Denies any complaints. Waiting safe discharge plan.  Physical Exam: Vitals:    02/19/24 0500 02/19/24 0530 02/19/24 0750 02/19/24 1203  BP: (!) (P) 151/98 (!) 151/98 (!) 121/90 113/79  Pulse: (P) 88 88 98 (!) 105  Resp:   17 17  Temp: (P) 97.6 F (36.4 C) 97.6 F (36.4 C) 97.7 F (36.5 C) 98 F (36.7 C)  TempSrc: (P) Oral Oral Oral   SpO2: (P) 97% 97% 99% 100%  Weight:      Height:        General - Elderly Caucasian male, no apparent distress HEENT - PERRLA, EOMI, atraumatic head, non tender sinuses. Lung - Clear, basal rales, rhonchi, no wheezes. Heart - S1, S2 heard, no murmurs, rubs, trace pedal edema. Abdomen - Soft, non tender, bowel sounds good Neuro - Alert, awake and oriented x 3, non focal exam. Skin - Warm and dry.  Data Reviewed:      Latest Ref Rng & Units 02/16/2024    2:34 AM 02/15/2024    4:30 AM 02/14/2024    4:05 AM  CBC  WBC 4.0 - 10.5 K/uL 5.4  5.3  4.1   Hemoglobin 13.0 - 17.0 g/dL 88.0  87.7  88.2   Hematocrit 39.0 - 52.0 % 36.1  36.9  35.4   Platelets 150 - 400 K/uL 217  207  200       Latest Ref Rng & Units 02/16/2024    2:34 AM 02/15/2024    4:30 AM 02/14/2024    4:05 AM  BMP  Glucose 70 - 99 mg/dL 85  91  92   BUN 8 - 23 mg/dL 43  35  44   Creatinine 0.61 - 1.24 mg/dL 9.17  9.11  9.13   Sodium 135 - 145 mmol/L 137  138  139   Potassium 3.5 - 5.1 mmol/L 3.9  4.0  3.8   Chloride 98 - 111 mmol/L 108  107  109   CO2 22 - 32 mmol/L 21  22  21    Calcium  8.9 - 10.3 mg/dL 9.6  9.5  9.3    No results found.  Family Communication: Discussed with patient, he understand and agree. All questions answered.  Disposition: Status is: Inpatient Remains inpatient appropriate because: awaiting safe placement  Planned Discharge Destination: group home/ shelter     Time spent: 36 minutes  Author: Concepcion Riser, MD 02/19/2024 4:22 PM Secure chat 7am to 7pm For on call review [email or web address].

## 2024-02-20 DIAGNOSIS — E871 Hypo-osmolality and hyponatremia: Secondary | ICD-10-CM | POA: Diagnosis not present

## 2024-02-20 DIAGNOSIS — E440 Moderate protein-calorie malnutrition: Secondary | ICD-10-CM | POA: Diagnosis not present

## 2024-02-20 DIAGNOSIS — E162 Hypoglycemia, unspecified: Secondary | ICD-10-CM | POA: Diagnosis not present

## 2024-02-20 DIAGNOSIS — I480 Paroxysmal atrial fibrillation: Secondary | ICD-10-CM | POA: Diagnosis not present

## 2024-02-20 MED ORDER — MIDODRINE HCL 5 MG PO TABS
5.0000 mg | ORAL_TABLET | Freq: Three times a day (TID) | ORAL | Status: DC
  Administered 2024-02-20 – 2024-02-28 (×18): 5 mg via ORAL
  Filled 2024-02-20 (×20): qty 1

## 2024-02-20 NOTE — Plan of Care (Signed)

## 2024-02-20 NOTE — Progress Notes (Signed)
 Progress Note   Patient: Anthony A Hockenberry Jr. FMW:[id number] DOB: 1956/01/10 DOA: 12/07/2023     74 DOS: the patient was seen and examined on 02/20/2024   Brief hospital course: Anthony [name] is a 68 y.o. male with PMH significant for homelessness, chronic alcoholism c/b withdrawals/DTs, chronic smoking, HTN, paroxysmal A-fib, h/o PE previously on Eliquis , hiatal hernia s/p Nissen fundoplication 2016, chronic low back pain, chronic anxiety, opiate withdrawal presented to the ED for confusion, low blood sugar.   During this hospitalization, he has also been seen by critical care, CHF team, GI, neurology, psychiatry and palliative care.   During this admission, he had intermittent agitation- requiring Ativan  per CIWA protocol, transferred to [facility] Patient was placed on Precedex , Librium , clonazepam ... Concluded treatment Patient was seen by GI, started on stress dose steroids.  Patient was also seen by advanced heart failure team for biventricular heart failure.     He was transferred to TRH service for further management of hypoglycemia, hyponatremia.   Per psychiatry consultation, patient lacks decision-making capacity, APS involved for placement.  However this has now resolved.  APS denied guardianship.  Pt making his own decisions. Currently stable for DC, pending safe disposition. He wishes to go to group home vs homeless shelter.   Assessment and Plan: SIRS with hypotension Resolved.  Completed empiric 5-day course of Zosyn .   Orthostatic hypotension Resolved after IV fluid hydration initiation of midodrine .  Compression stockings and abdominal binder on board as well. BP better today, Midodrine  changed to 5 mg 3 times daily.   Acute metabolic encephalopathy secondary to alcohol  withdrawal Off Precedex .  Vitamin supplementation.  Now resolved.   Hospital delirium Resolved at this time, patient back to his baseline.   Retroperitoneal hematoma/chronic iron  deficiency anemia/acute blood loss  anemia S/p 5 units total RBCs during course of hospital stay.  Hemoglobin stable.  No active bleeding.  Eliquis  resumed.   A-fib with RVR Initially on Cardizem  drip.  Subsequently discontinued, transition to amiodarone  p.o. Eliquis .  Rate appears well-controlled at this time.   HFrEF EF 30-40%.  GDMT limited by hypotension.   Hyponatremia Responding well to urea  powder.  Sodium normal.   Hypokalemia/hypomagnesemia Supplementation on board.  Checking intermittently.   Acute urinary retention Flomax  initiated on 7/30.  Monitor response and blood pressure closely.   Goals of care Patient was homeless, TOC working on dispo discharge disposition. Per psychiatry 6/15 the patient lacks capacity for decision-making. However, pt is now very conversive, oriented, able to make his own decisions.   APS involved however now stating they cannot pursue guardianship.  Appears patient does not want to pursue ALF and stated he would walk out if placed there. Is willing to go back to the community either boarding house or similar. He wishes to go to group home vs homeless shelter.     Out of bed to chair. Incentive spirometry. Nursing supportive care. Fall, aspiration precautions. Diet:  Diet Orders (From admission, onward)     Start     Ordered   01/06/24 1359  Diet regular Room service appropriate? Yes; Fluid consistency: Thin  Diet effective now       Question Answer Comment  Room service appropriate? Yes   Fluid consistency: Thin      01/06/24 1358           DVT prophylaxis: Place and maintain sequential compression device Start: 01/30/24 1209 Place TED hose Start: 01/30/24 1209 apixaban  (ELIQUIS ) tablet 5 mg  Level of care: Med-Surg  Code Status: Limited: Do not attempt resuscitation (DNR) -DNR-LIMITED -Do Not Intubate/DNI   Subjective: Patient is seen and examined today morning. He is sitting eating breakfast. No overnight issues. Waiting safe discharge plan.  Physical  Exam: Vitals:   02/20/24 0038 02/20/24 0411 02/20/24 0806 02/20/24 1226  BP: 118/86 (!) 128/90 127/87 125/83  Pulse: (!) 104 94 91 (!) 104  Resp: 16 16    Temp: 98.6 F (37 C) (!) 97.5 F (36.4 C) 98.3 F (36.8 C) 98.7 F (37.1 C)  TempSrc: Oral Oral    SpO2: 99% 99% 100% 100%  Weight:      Height:        General - Elderly Caucasian male, no apparent distress HEENT - PERRLA, EOMI, atraumatic head, non tender sinuses. Lung - Clear, basal rales, rhonchi, no wheezes. Heart - S1, S2 heard, no murmurs, rubs, trace pedal edema. Abdomen - Soft, non tender, bowel sounds good Neuro - Alert, awake and oriented x 3, non focal exam. Skin - Warm and dry.  Data Reviewed:      Latest Ref Rng & Units 02/16/2024    2:34 AM 02/15/2024    4:30 AM 02/14/2024    4:05 AM  CBC  WBC 4.0 - 10.5 K/uL 5.4  5.3  4.1   Hemoglobin 13.0 - 17.0 g/dL 88.0  87.7  88.2   Hematocrit 39.0 - 52.0 % 36.1  36.9  35.4   Platelets 150 - 400 K/uL 217  207  200       Latest Ref Rng & Units 02/16/2024    2:34 AM 02/15/2024    4:30 AM 02/14/2024    4:05 AM  BMP  Glucose 70 - 99 mg/dL 85  91  92   BUN 8 - 23 mg/dL 43  35  44   Creatinine 0.61 - 1.24 mg/dL 9.17  9.11  9.13   Sodium 135 - 145 mmol/L 137  138  139   Potassium 3.5 - 5.1 mmol/L 3.9  4.0  3.8   Chloride 98 - 111 mmol/L 108  107  109   CO2 22 - 32 mmol/L 21  22  21    Calcium  8.9 - 10.3 mg/dL 9.6  9.5  9.3    No results found.  Family Communication: Discussed with patient, he understand and agree. All questions answered.  Disposition: Status is: Inpatient Remains inpatient appropriate because: awaiting safe placement  Planned Discharge Destination: group home/ shelter     Time spent: 30 minutes  Author: Concepcion Riser, MD 02/20/2024 1:00 PM Secure chat 7am to 7pm For on call review [email or web address].

## 2024-02-21 DIAGNOSIS — E162 Hypoglycemia, unspecified: Secondary | ICD-10-CM | POA: Diagnosis not present

## 2024-02-21 DIAGNOSIS — I480 Paroxysmal atrial fibrillation: Secondary | ICD-10-CM | POA: Diagnosis not present

## 2024-02-21 DIAGNOSIS — E440 Moderate protein-calorie malnutrition: Secondary | ICD-10-CM | POA: Diagnosis not present

## 2024-02-21 DIAGNOSIS — E871 Hypo-osmolality and hyponatremia: Secondary | ICD-10-CM | POA: Diagnosis not present

## 2024-02-21 MED ORDER — TUBERCULIN PPD 5 UNIT/0.1ML ID SOLN
5.0000 [IU] | Freq: Once | INTRADERMAL | Status: AC
  Administered 2024-02-21: 5 [IU] via INTRADERMAL
  Filled 2024-02-21: qty 0.1

## 2024-02-21 NOTE — Progress Notes (Signed)
 Progress Note   Patient: Anthony A Condie Jr. FMW:[id number] DOB: May 24, 1956 DOA: 12/07/2023     75 DOS: the patient was seen and examined on 02/21/2024   Brief hospital course: Anthony [name] is a 68 y.o. male with PMH significant for homelessness, chronic alcoholism c/b withdrawals/DTs, chronic smoking, HTN, paroxysmal A-fib, h/o PE previously on Eliquis , hiatal hernia s/p Nissen fundoplication 2016, chronic low back pain, chronic anxiety, opiate withdrawal presented to the ED for confusion, low blood sugar.   During this hospitalization, he has also been seen by critical care, CHF team, GI, neurology, psychiatry and palliative care.   During this admission, he had intermittent agitation- requiring Ativan  per CIWA protocol, transferred to [facility] Patient was placed on Precedex , Librium , clonazepam ... Concluded treatment Patient was seen by GI, started on stress dose steroids.  Patient was also seen by advanced heart failure team for biventricular heart failure.     He was transferred to TRH service for further management of hypoglycemia, hyponatremia.   Per psychiatry consultation, patient lacks decision-making capacity, APS involved for placement.  However this has now resolved.  APS denied guardianship.  Pt making his own decisions. Currently stable for DC, pending safe disposition. He wishes to go to group home vs homeless shelter.   Assessment and Plan: SIRS with hypotension Resolved.  Completed empiric 5-day course of Zosyn .   Orthostatic hypotension Resolved after IV fluid hydration initiation of midodrine .  Compression stockings and abdominal binder on board as well. BP better today, Midodrine  changed to 5 mg 3 times daily.   Acute metabolic encephalopathy secondary to alcohol  withdrawal Off Precedex .  Vitamin supplementation.  Now resolved.   Hospital delirium Resolved at this time, patient back to his baseline.   Retroperitoneal hematoma/chronic iron  deficiency anemia/acute blood loss  anemia S/p 5 units total RBCs during course of hospital stay.  Hemoglobin stable.  No active bleeding.  Eliquis  resumed.   A-fib with RVR Initially on Cardizem  drip.  Subsequently discontinued, transition to amiodarone  p.o. Eliquis .  Rate appears well-controlled at this time.   HFrEF EF 30-40%.  GDMT limited by hypotension.   Hyponatremia Responding well to urea  powder.  Sodium normal.   Hypokalemia/hypomagnesemia Supplementation on board.  Checking intermittently.   Acute urinary retention Flomax  initiated on 7/30.  Monitor response and blood pressure closely.   Goals of care Patient was homeless, TOC working on dispo discharge disposition. Per psychiatry 6/15 the patient lacks capacity for decision-making. However, pt is now very conversive, oriented, able to make his own decisions.   APS involved however now stating they cannot pursue guardianship.  Appears patient does not want to pursue ALF and stated he would walk out if placed there. Is willing to go back to the community either boarding house or similar. He wishes to go to group home vs homeless shelter. TB skin test order placed for placement purposes to be read in 48 hrs.     Out of bed to chair. Incentive spirometry. Nursing supportive care. Fall, aspiration precautions. Diet:  Diet Orders (From admission, onward)     Start     Ordered   01/06/24 1359  Diet regular Room service appropriate? Yes; Fluid consistency: Thin  Diet effective now       Question Answer Comment  Room service appropriate? Yes   Fluid consistency: Thin      01/06/24 1358           DVT prophylaxis: Place and maintain sequential compression device Start: 01/30/24 1209 Place TED hose Start:  01/30/24 1209 apixaban  (ELIQUIS ) tablet 5 mg  Level of care: Med-Surg   Code Status: Limited: Do not attempt resuscitation (DNR) -DNR-LIMITED -Do Not Intubate/DNI   Subjective: Patient is seen and examined today morning. He is comfortable, states  working with Child psychotherapist for placement. No overnight issues.   Physical Exam: Vitals:   02/20/24 1942 02/21/24 0029 02/21/24 0619 02/21/24 0750  BP: 115/73 127/83 127/82 126/80  Pulse: 99 98 95 81  Resp: 16 18 18 19   Temp: 98.3 F (36.8 C) 98.9 F (37.2 C)  97.7 F (36.5 C)  TempSrc: Oral   Oral  SpO2: 99% 100% 100% 99%  Weight:      Height:        General - Elderly Caucasian male, no apparent distress HEENT - PERRLA, EOMI, atraumatic head, non tender sinuses. Lung - Clear, basal rales, rhonchi, no wheezes. Heart - S1, S2 heard, no murmurs, rubs, trace pedal edema. Abdomen - Soft, non tender, bowel sounds good Neuro - Alert, awake and oriented x 3, non focal exam. Skin - Warm and dry.  Data Reviewed:      Latest Ref Rng & Units 02/16/2024    2:34 AM 02/15/2024    4:30 AM 02/14/2024    4:05 AM  CBC  WBC 4.0 - 10.5 K/uL 5.4  5.3  4.1   Hemoglobin 13.0 - 17.0 g/dL 88.0  87.7  88.2   Hematocrit 39.0 - 52.0 % 36.1  36.9  35.4   Platelets 150 - 400 K/uL 217  207  200       Latest Ref Rng & Units 02/16/2024    2:34 AM 02/15/2024    4:30 AM 02/14/2024    4:05 AM  BMP  Glucose 70 - 99 mg/dL 85  91  92   BUN 8 - 23 mg/dL 43  35  44   Creatinine 0.61 - 1.24 mg/dL 9.17  9.11  9.13   Sodium 135 - 145 mmol/L 137  138  139   Potassium 3.5 - 5.1 mmol/L 3.9  4.0  3.8   Chloride 98 - 111 mmol/L 108  107  109   CO2 22 - 32 mmol/L 21  22  21    Calcium  8.9 - 10.3 mg/dL 9.6  9.5  9.3    No results found.  Family Communication: Discussed with patient, he understand and agree. All questions answered.  Disposition: Status is: Inpatient Remains inpatient appropriate because: awaiting safe placement, TB skin test placed.  Planned Discharge Destination: group home/ shelter     Time spent: 30 minutes  Author: Concepcion Riser, MD 02/21/2024 1:19 PM Secure chat 7am to 7pm For on call review [email or web address].

## 2024-02-21 NOTE — Plan of Care (Signed)
  Problem: Health Behavior/Discharge Planning: Goal: Ability to manage health-related needs will improve Outcome: Progressing   Problem: Clinical Measurements: Goal: Ability to maintain clinical measurements within normal limits will improve Outcome: Progressing   Problem: Pain Managment: Goal: General experience of comfort will improve and/or be controlled Outcome: Progressing   Problem: Nutrition: Goal: Adequate nutrition will be maintained Outcome: Progressing

## 2024-02-21 NOTE — TOC Progression Note (Addendum)
 Transition of Care (TOC) - Progression Note    Patient Details  Name: Anthony A Szilagyi Jr. MRN: [id number] Date of Birth: August 08, 1955  Transition of [facility]) CM/SW Contact  Anthony Herren A [place], LCSW Phone Number: 02/21/2024, 1:11 PM  Clinical Narrative:     CSW contacted [facility]. He said that he visited pt this weekend, went well. He has a bed available for pt.He said that he explained the funding with pt and believes pt has understanding. He stated that he need to confirm that pt can provide first month's funds for placement and needs a TB skin test before he can be admitted.   CSW also reached out to [facility] and provided contact information to reach back out to CSW. Sent clinicals for pt for review to [email or web address] MD notified. CSW to follow up with pt regarding financial request and continue to coordinate placement.  CSW will continue to follow.   Expected Discharge Plan: Skilled Nursing Facility Barriers to Discharge: Continued Medical Work up               Expected Discharge Plan and Services In-house Referral: Clinical Social Work     Living arrangements for the past 2 months: Homeless                                       Social Drivers of Health (SDOH) Interventions SDOH Screenings   Food Insecurity: Patient Unable To Answer (12/09/2023)  Housing: Patient Unable To Answer (12/09/2023)  Transportation Needs: Patient Unable To Answer (12/09/2023)  Utilities: Patient Unable To Answer (12/09/2023)  Depression (PHQ2-9): Medium Risk (10/16/2020)  Social Connections: Unknown (12/22/2023)  Tobacco Use: High Risk (12/08/2023)    Readmission Risk Interventions    01/14/2024    9:48 AM 08/27/2023   12:21 PM 08/19/2023    9:17 AM  Readmission Risk Prevention Plan  Transportation Screening Complete Complete Complete  Medication Review Oceanographer) Complete Complete Complete  PCP or Specialist appointment within 3-5 days of  discharge Complete Complete   HRI or Home Care Consult Complete Complete Complete  SW Recovery Care/Counseling Consult Complete Complete Complete  Palliative Care Screening Not Applicable Not Applicable Not Applicable  Skilled Nursing Facility Not Applicable Not Applicable Not Applicable

## 2024-02-22 DIAGNOSIS — I480 Paroxysmal atrial fibrillation: Secondary | ICD-10-CM | POA: Diagnosis not present

## 2024-02-22 DIAGNOSIS — E162 Hypoglycemia, unspecified: Secondary | ICD-10-CM | POA: Diagnosis not present

## 2024-02-22 DIAGNOSIS — E440 Moderate protein-calorie malnutrition: Secondary | ICD-10-CM | POA: Diagnosis not present

## 2024-02-22 DIAGNOSIS — E871 Hypo-osmolality and hyponatremia: Secondary | ICD-10-CM | POA: Diagnosis not present

## 2024-02-22 NOTE — TOC Progression Note (Signed)
 Transition of Care (TOC) - Progression Note    Patient Details  Name: Anthony A Bills Jr. MRN: [id number] Date of Birth: [birth year]  Transition of Care [facility]) CM/SW Contact  Keone Kamer A [place], LCSW Phone Number: 02/22/2024, 10:41 AM  Clinical Narrative:     CSW spoke with pt and Rosana, group home owner. Confirmed pt has funds, stated he can write certified check for month of August and if agrees to longer term stay, will allow facility to be direct deposit at facility.   CSW to assist with transportation to facility. Pt stated he is agreeable as long as he can travel on the bus, Rosana stated that is fine especially since pt is his own guardian.   Confirmed with Rosana that pt has Medicaid and Medicare.  Waiting on TB skin test results, will send once resulted.   CSW will continue to assist with placement coordination needs.     Expected Discharge Plan: Skilled Nursing Facility Barriers to Discharge: Continued Medical Work up               Expected Discharge Plan and Services In-house Referral: Clinical Social Work     Living arrangements for the past 2 months: Homeless                                       Social Drivers of Health (SDOH) Interventions SDOH Screenings   Food Insecurity: Patient Unable To Answer (12/09/2023)  Housing: Patient Unable To Answer (12/09/2023)  Transportation Needs: Patient Unable To Answer (12/09/2023)  Utilities: Patient Unable To Answer (12/09/2023)  Depression (PHQ2-9): Medium Risk (10/16/2020)  Social Connections: Unknown (12/22/2023)  Tobacco Use: High Risk (12/08/2023)    Readmission Risk Interventions    01/14/2024    9:48 AM 08/27/2023   12:21 PM 08/19/2023    9:17 AM  Readmission Risk Prevention Plan  Transportation Screening Complete Complete Complete  Medication Review Oceanographer) Complete Complete Complete  PCP or Specialist appointment within 3-5 days of discharge Complete Complete   HRI or Home Care Consult  Complete Complete Complete  SW Recovery Care/Counseling Consult Complete Complete Complete  Palliative Care Screening Not Applicable Not Applicable Not Applicable  Skilled Nursing Facility Not Applicable Not Applicable Not Applicable

## 2024-02-22 NOTE — Progress Notes (Signed)
 Progress Note   Patient: Anthony A Stonehocker Jr. FMW:[id number] DOB: 1955-10-01 DOA: 12/07/2023     76 DOS: the patient was seen and examined on 02/22/2024   Brief hospital course: Anthony [name] is a 68 y.o. male with PMH significant for homelessness, chronic alcoholism c/b withdrawals/DTs, chronic smoking, HTN, paroxysmal A-fib, h/o PE previously on Eliquis , hiatal hernia s/p Nissen fundoplication 2016, chronic low back pain, chronic anxiety, opiate withdrawal presented to the ED for confusion, low blood sugar.   During this hospitalization, he has also been seen by critical care, CHF team, GI, neurology, psychiatry and palliative care.   During this admission, he had intermittent agitation- requiring Ativan  per CIWA protocol, transferred to [facility] Patient was placed on Precedex , Librium , clonazepam ... Concluded treatment Patient was seen by GI, started on stress dose steroids.  Patient was also seen by advanced heart failure team for biventricular heart failure.     He was transferred to TRH service for further management of hypoglycemia, hyponatremia.   Per psychiatry consultation, patient lacks decision-making capacity, APS involved for placement.  However this has now resolved.  APS denied guardianship.  Pt making his own decisions. Currently stable for DC, pending safe disposition. He wishes to go to group home vs homeless shelter.   Assessment and Plan: SIRS with hypotension Resolved.  Completed empiric 5-day course of Zosyn .   Orthostatic hypotension Resolved after IV fluid hydration initiation of midodrine .  Compression stockings and abdominal binder on board as well. BP better, Midodrine  changed to 5 mg 3 times daily.   Acute metabolic encephalopathy secondary to alcohol  withdrawal Off Precedex . Mental status back to baseline. Continue vitamin supplementation.    Hospital delirium -Resolved at this time, patient back to his baseline.   Retroperitoneal hematoma/chronic iron  deficiency  anemia/acute blood loss anemia S/p 5 units total RBCs during course of hospital stay.  Hemoglobin stable.  No active bleeding.  Eliquis  resumed.   A-fib with RVR Initially on Cardizem  drip.  Subsequently discontinued, transition to amiodarone  p.o. Eliquis .  Rate appears well-controlled at this time.   HFrEF EF 30-40%.  GDMT limited by hypotension.   Hyponatremia Responding well to urea  powder.  Sodium normal.   Hypokalemia/hypomagnesemia Supplementation on board.  Checking intermittently.   Acute urinary retention Flomax  initiated on 7/30.  Monitor response and blood pressure closely.   Goals of care Patient was homeless, TOC working on discharge plan. Per psychiatry 6/15 the patient lacks capacity for decision-making. However, pt is now very conversive, oriented, able to make his own decisions.   APS involved cannot pursue guardianship.  He wishes to go to group home vs homeless shelter. TOC working on a group home. TB skin test order placed for placement purposes to be read in 48 hrs.     Out of bed to chair. Incentive spirometry. Nursing supportive care. Fall, aspiration precautions. Diet:  Diet Orders (From admission, onward)     Start     Ordered   01/06/24 1359  Diet regular Room service appropriate? Yes; Fluid consistency: Thin  Diet effective now       Question Answer Comment  Room service appropriate? Yes   Fluid consistency: Thin      01/06/24 1358           DVT prophylaxis: Place and maintain sequential compression device Start: 01/30/24 1209 Place TED hose Start: 01/30/24 1209 apixaban  (ELIQUIS ) tablet 5 mg  Level of care: Med-Surg   Code Status: Limited: Do not attempt resuscitation (DNR) -DNR-LIMITED -Do Not Intubate/DNI  Subjective: Patient is seen and examined today morning. He denies any complaints, states working with Child psychotherapist for placement. No overnight issues.   Physical Exam: Vitals:   02/21/24 1557 02/21/24 2000 02/21/24 2212 02/22/24  0733  BP: 108/67 111/83 (!) 105/55 117/79  Pulse: 93 98 89 (!) 105  Resp: 19 18 17 19   Temp: (!) 97.5 F (36.4 C) 97.8 F (36.6 C) 99.4 F (37.4 C) 97.8 F (36.6 C)  TempSrc: Oral Oral Oral Oral  SpO2: 100% 100% 97% 100%  Weight:      Height:        General - Elderly Caucasian male, no apparent distress HEENT - PERRLA, EOMI, atraumatic head, non tender sinuses. Lung - Clear, basal rales, rhonchi, no wheezes. Heart - S1, S2 heard, no murmurs, rubs, trace pedal edema. Abdomen - Soft, non tender, bowel sounds good Neuro - Alert, awake and oriented x 3, non focal exam. Skin - Warm and dry.  Data Reviewed:      Latest Ref Rng & Units 02/16/2024    2:34 AM 02/15/2024    4:30 AM 02/14/2024    4:05 AM  CBC  WBC 4.0 - 10.5 K/uL 5.4  5.3  4.1   Hemoglobin 13.0 - 17.0 g/dL 88.0  87.7  88.2   Hematocrit 39.0 - 52.0 % 36.1  36.9  35.4   Platelets 150 - 400 K/uL 217  207  200       Latest Ref Rng & Units 02/16/2024    2:34 AM 02/15/2024    4:30 AM 02/14/2024    4:05 AM  BMP  Glucose 70 - 99 mg/dL 85  91  92   BUN 8 - 23 mg/dL 43  35  44   Creatinine 0.61 - 1.24 mg/dL 9.17  9.11  9.13   Sodium 135 - 145 mmol/L 137  138  139   Potassium 3.5 - 5.1 mmol/L 3.9  4.0  3.8   Chloride 98 - 111 mmol/L 108  107  109   CO2 22 - 32 mmol/L 21  22  21    Calcium  8.9 - 10.3 mg/dL 9.6  9.5  9.3    No results found.  Family Communication: Discussed with patient, he understand and agree. All questions answered.  Disposition: Status is: Inpatient Remains inpatient appropriate because: awaiting safe placement, TB skin test to be read tomorrow.  Planned Discharge Destination: group home/ shelter     Time spent: 30 minutes  Author: Concepcion Riser, MD 02/22/2024 11:59 AM Secure chat 7am to 7pm For on call review [email or web address].

## 2024-02-22 NOTE — Progress Notes (Signed)
 Mobility Specialist: Progress Note   02/22/24 1552  Mobility  Activity Ambulated independently  Level of Assistance Independent  Assistive Device None  Distance Ambulated (ft) 2000 ft  Activity Response Tolerated well  Mobility Referral Yes  Mobility visit 1 Mobility  Mobility Specialist Start Time (ACUTE ONLY) 1155  Mobility Specialist Stop Time (ACUTE ONLY) 1213  Mobility Specialist Time Calculation (min) (ACUTE ONLY) 18 min    Pt received in bed, agreeable to mobility session. No complaints. Returned to room without fault. Left ambulating in room with all needs met, call bell in reach.    Ileana Lute Mobility Specialist Please contact via SecureChat or Rehab office at [phone]

## 2024-02-23 DIAGNOSIS — E162 Hypoglycemia, unspecified: Secondary | ICD-10-CM | POA: Diagnosis not present

## 2024-02-23 NOTE — Progress Notes (Signed)
 Progress Note   Patient: Anthony A Hakim Jr. FMW:[id number] DOB: July 30, 1955 DOA: 12/07/2023     77 DOS: the patient was seen and examined on 02/23/2024   Brief hospital course: Tollison Cameo is a 68 y.o. male with PMH significant for homelessness, chronic alcoholism c/b withdrawals/DTs, chronic smoking, HTN, paroxysmal A-fib, h/o PE previously on Eliquis , hiatal hernia s/p Nissen fundoplication 2016, chronic low back pain, chronic anxiety, opiate withdrawal presented to the ED for confusion, low blood sugar.   During this hospitalization, he has also been seen by critical care, CHF team, GI, neurology, psychiatry and palliative care.   During this admission, he had intermittent agitation- requiring Ativan  per CIWA protocol, transferred to PCCM Patient was placed on Precedex , Librium , clonazepam ... Concluded treatment Patient was seen by GI, started on stress dose steroids.  Patient was also seen by advanced heart failure team for biventricular heart failure.     He was transferred to TRH service for further management of hypoglycemia, hyponatremia.   Per psychiatry consultation, patient lacks decision-making capacity, APS involved for placement.  However this has now resolved.  APS denied guardianship.  Pt making his own decisions. Currently stable for DC, pending safe disposition. He wishes to go to group home vs homeless shelter.   Assessment and Plan: SIRS with hypotension: Resolved.  Completed empiric 5-day course of Zosyn .   Orthostatic hypotension Resolved after IV fluid hydration initiation of midodrine .  Compression stockings and abdominal binder on board as well. BP better, Midodrine  changed to 5 mg 3 times daily.   Acute metabolic encephalopathy secondary to alcohol  withdrawal Off Precedex . Mental status back to baseline. Continue vitamin supplementation.    Hospital delirium -Resolved at this time, patient back to his baseline.   Retroperitoneal hematoma/chronic iron  deficiency  anemia/acute blood loss anemia S/p 5 units total RBCs during course of hospital stay.  Hemoglobin stable.  No active bleeding.  Eliquis  resumed.   A-fib with RVR Initially on Cardizem  drip.  Subsequently discontinued, transition to amiodarone  p.o. Eliquis .  Rate appears well-controlled at this time.   HFrEF EF 30-40%.  GDMT limited by hypotension.   Hyponatremia Responding well to urea  powder.  Sodium normal.   Hypokalemia/hypomagnesemia Supplementation on board.  Checking intermittently.   Acute urinary retention Flomax  initiated on 7/30.  Monitor response and blood pressure closely.   Goals of care Patient was homeless, TOC working on discharge plan. Per psychiatry 6/15 the patient lacks capacity for decision-making. However, pt is now very conversive, oriented, able to make his own decisions.   APS involved cannot pursue guardianship.  He wishes to go to group home vs homeless shelter. TOC working on a group home. TB skin test order placed for placement purposes to be read in 48 hrs.     Out of bed to chair. Incentive spirometry. Nursing supportive care. Fall, aspiration precautions. Diet:  Diet Orders (From admission, onward)     Start     Ordered   01/06/24 1359  Diet regular Room service appropriate? Yes; Fluid consistency: Thin  Diet effective now       Question Answer Comment  Room service appropriate? Yes   Fluid consistency: Thin      01/06/24 1358           DVT prophylaxis: Place and maintain sequential compression device Start: 01/30/24 1209 Place TED hose Start: 01/30/24 1209 apixaban  (ELIQUIS ) tablet 5 mg  Level of care: Med-Surg   Code Status: Limited: Do not attempt resuscitation (DNR) -DNR-LIMITED -Do Not Intubate/DNI  Subjective: Patient was seen and examined today morning at bedside.  No significant events overnight.  Patient was sitting comfortably on the bed.  Patient is aware that placement is under process.  Most likely he will be discharged  in 1 to 2 days.    Physical Exam: Vitals:   02/22/24 2049 02/23/24 0114 02/23/24 0500 02/23/24 1215  BP: (!) 135/94 118/73 124/78 127/84  Pulse: 98 94 97 90  Resp: 18 18 18 19   Temp: 97.8 F (36.6 C) 97.8 F (36.6 C) 97.8 F (36.6 C) (!) 97.5 F (36.4 C)  TempSrc:    Oral  SpO2: 100% 100% 100% 100%  Weight:      Height:        General - Elderly Caucasian male, no apparent distress HEENT - PERRLA, EOMI, atraumatic head, non tender sinuses. Lung - Clear, basal rales, rhonchi, no wheezes. Heart - S1, S2 heard, no murmurs, rubs, trace pedal edema. Abdomen - Soft, non tender, bowel sounds good Neuro - Alert, awake and oriented x 3, non focal exam. Skin - Warm and dry.  Data Reviewed:      Latest Ref Rng & Units 02/16/2024    2:34 AM 02/15/2024    4:30 AM 02/14/2024    4:05 AM  CBC  WBC 4.0 - 10.5 K/uL 5.4  5.3  4.1   Hemoglobin 13.0 - 17.0 g/dL 88.0  87.7  88.2   Hematocrit 39.0 - 52.0 % 36.1  36.9  35.4   Platelets 150 - 400 K/uL 217  207  200       Latest Ref Rng & Units 02/16/2024    2:34 AM 02/15/2024    4:30 AM 02/14/2024    4:05 AM  BMP  Glucose 70 - 99 mg/dL 85  91  92   BUN 8 - 23 mg/dL 43  35  44   Creatinine 0.61 - 1.24 mg/dL 9.17  9.11  9.13   Sodium 135 - 145 mmol/L 137  138  139   Potassium 3.5 - 5.1 mmol/L 3.9  4.0  3.8   Chloride 98 - 111 mmol/L 108  107  109   CO2 22 - 32 mmol/L 21  22  21    Calcium  8.9 - 10.3 mg/dL 9.6  9.5  9.3    No results found.  Family Communication: Discussed with patient, he understand and agree. All questions answered.  Disposition: Status is: Inpatient Remains inpatient appropriate because: awaiting safe placement, TB skin test to be read tomorrow.  Planned Discharge Destination: group home/ shelter     Time spent: 30 minutes  Author: Elvan Sor, MD 02/23/2024 1:14 PM Secure chat 7am to 7pm For on call review [email or web address].

## 2024-02-23 NOTE — Plan of Care (Signed)
  Problem: Clinical Measurements: Goal: Ability to maintain clinical measurements within normal limits will improve Outcome: Progressing Goal: Will remain free from infection Outcome: Progressing Goal: Diagnostic test results will improve Outcome: Progressing Goal: Respiratory complications will improve Outcome: Progressing Goal: Cardiovascular complication will be avoided Outcome: Progressing   Problem: Health Behavior/Discharge Planning: Goal: Ability to manage health-related needs will improve Outcome: Progressing   

## 2024-02-23 NOTE — Progress Notes (Signed)
 TB Skin test read 02/23/2024 and results as negative in the left forearm.

## 2024-02-23 NOTE — TOC Progression Note (Signed)
 Transition of Care (TOC) - Progression Note    Patient Details  Name: Anthony A Neas Jr. MRN: [id number] Date of Birth: 01/12/1956  Transition of Care [facility]) CM/SW Contact  Moria Brophy A [place], LCSW Phone Number: 02/23/2024, 12:27 PM  Clinical Narrative:     CSW spoke with pt at bedside to update on DC plan. CSW informed him CSW coordinating with facility at [facility] for DC Thursday/Friday. Pt remains agreeable to DC to group home in [place], [place] as well as pay for August rent with possibility for direct deposit payments to be set up. No other questions asked by pt.  Pt needs to be DC'd early afternoon to ensure can get funds from his bank to pay facility.   Plan for transportation by group home staff or [facility] safe transport, CSW to follow up with group home owner.   CSW to send PPD results to [facility] when available, estimated by this afternoon.   CSW requested MD to send pt's medications to [facility] pharmacy at discharge.   CSW will continue to follow.   Expected Discharge Plan: Skilled Nursing Facility Barriers to Discharge: Continued Medical Work up               Expected Discharge Plan and Services In-house Referral: Clinical Social Work     Living arrangements for the past 2 months: Homeless                                       Social Drivers of Health (SDOH) Interventions SDOH Screenings   Food Insecurity: Patient Unable To Answer (12/09/2023)  Housing: Patient Unable To Answer (12/09/2023)  Transportation Needs: Patient Unable To Answer (12/09/2023)  Utilities: Patient Unable To Answer (12/09/2023)  Depression (PHQ2-9): Medium Risk (10/16/2020)  Social Connections: Unknown (12/22/2023)  Tobacco Use: High Risk (12/08/2023)    Readmission Risk Interventions    01/14/2024    9:48 AM 08/27/2023   12:21 PM 08/19/2023    9:17 AM  Readmission Risk Prevention Plan  Transportation Screening Complete Complete Complete  Medication Review Oceanographer) Complete  Complete Complete  PCP or Specialist appointment within 3-5 days of discharge Complete Complete   HRI or Home Care Consult Complete Complete Complete  SW Recovery Care/Counseling Consult Complete Complete Complete  Palliative Care Screening Not Applicable Not Applicable Not Applicable  Skilled Nursing Facility Not Applicable Not Applicable Not Applicable

## 2024-02-24 DIAGNOSIS — E162 Hypoglycemia, unspecified: Secondary | ICD-10-CM | POA: Diagnosis not present

## 2024-02-24 NOTE — Progress Notes (Signed)
 Progress Note   Patient: Anthony A Goettl Jr. FMW:[id number] DOB: 09/28/1955 DOA: 12/07/2023     78 DOS: the patient was seen and examined on 02/24/2024   Brief hospital course: Anthony [name] is a 68 y.o. male with PMH significant for homelessness, chronic alcoholism c/b withdrawals/DTs, chronic smoking, HTN, paroxysmal A-fib, h/o PE previously on Eliquis , hiatal hernia s/p Nissen fundoplication 2016, chronic low back pain, chronic anxiety, opiate withdrawal presented to the ED for confusion, low blood sugar.   During this hospitalization, he has also been seen by critical care, CHF team, GI, neurology, psychiatry and palliative care.   During this admission, he had intermittent agitation- requiring Ativan  per CIWA protocol, transferred to [facility] Patient was placed on Precedex , Librium , clonazepam ... Concluded treatment Patient was seen by GI, started on stress dose steroids.  Patient was also seen by advanced heart failure team for biventricular heart failure.     He was transferred to TRH service for further management of hypoglycemia, hyponatremia.   Per psychiatry consultation, patient lacks decision-making capacity, APS involved for placement.  However this has now resolved.  APS denied guardianship.  Pt making his own decisions. Currently stable for DC, pending safe disposition. He wishes to go to group home vs homeless shelter.   Assessment and Plan:  SIRS with hypotension: Resolved.  Completed empiric 5-day course of Zosyn .   Orthostatic hypotension Resolved after IV fluid hydration initiation of midodrine .  Compression stockings and abdominal binder on board as well. BP better, Midodrine  changed to 5 mg 3 times daily.   Acute metabolic encephalopathy secondary to alcohol  withdrawal Off Precedex . Mental status back to baseline. Continue vitamin supplementation.    Hospital delirium -Resolved at this time, patient back to his baseline.   Retroperitoneal hematoma/chronic iron  deficiency  anemia/acute blood loss anemia S/p 5 units total RBCs during course of hospital stay.  Hemoglobin stable.  No active bleeding.  Eliquis  resumed.   A-fib with RVR Initially on Cardizem  drip.  Subsequently discontinued, transition to amiodarone  p.o. Eliquis .  Rate appears well-controlled at this time.   HFrEF EF 30-40%.  GDMT limited by hypotension.   Hyponatremia Responding well to urea  powder.  Sodium normal.   Hypokalemia/hypomagnesemia Supplementation on board.  Checking intermittently.   Acute urinary retention Flomax  initiated on 7/30.  Monitor response and blood pressure closely.   Goals of care Patient was homeless, TOC working on discharge plan. Per psychiatry 6/15 the patient lacks capacity for decision-making. However, pt is now very conversive, oriented, able to make his own decisions.   APS involved cannot pursue guardianship.  He wishes to go to group home vs homeless shelter. TOC working on a group home. TB skin test order placed for placement purposes to be read in 48 hrs.     Out of bed to chair. Incentive spirometry. Nursing supportive care. Fall, aspiration precautions. Diet:  Diet Orders (From admission, onward)     Start     Ordered   01/06/24 1359  Diet regular Room service appropriate? Yes; Fluid consistency: Thin  Diet effective now       Question Answer Comment  Room service appropriate? Yes   Fluid consistency: Thin      01/06/24 1358           DVT prophylaxis: Place and maintain sequential compression device Start: 01/30/24 1209 Place TED hose Start: 01/30/24 1209 apixaban  (ELIQUIS ) tablet 5 mg  Level of care: Med-Surg   Code Status: Limited: Do not attempt resuscitation (DNR) -DNR-LIMITED -Do Not Intubate/DNI  Subjective: Patient was seen and examined today at bedside.  No significant events overnight.  Patient was resting comfortably in the bed.  Patient was informed that he will be transferred to the facility on Monday as per social  worker.   Physical Exam: Vitals:   02/23/24 2124 02/24/24 0000 02/24/24 0500 02/24/24 1008  BP: (!) 126/90 127/82 (!) 125/90 108/74  Pulse: 86 90 99 (!) 107  Resp: 17 18 18    Temp: 97.6 F (36.4 C) 97.6 F (36.4 C) 97.6 F (36.4 C) 97.6 F (36.4 C)  TempSrc: Oral Oral Oral   SpO2: 100%  100% 100%  Weight:   69 kg   Height:        General - Elderly Caucasian male, no apparent distress HEENT - PERRLA, EOMI, atraumatic head, non tender sinuses. Lung - Clear, basal rales, rhonchi, no wheezes. Heart - S1, S2 heard, no murmurs, rubs, trace pedal edema. Abdomen - Soft, non tender, bowel sounds good Neuro - Alert, awake and oriented x 3, non focal exam. Skin - Warm and dry.  Data Reviewed:      Latest Ref Rng & Units 02/16/2024    2:34 AM 02/15/2024    4:30 AM 02/14/2024    4:05 AM  CBC  WBC 4.0 - 10.5 K/uL 5.4  5.3  4.1   Hemoglobin 13.0 - 17.0 g/dL 88.0  87.7  88.2   Hematocrit 39.0 - 52.0 % 36.1  36.9  35.4   Platelets 150 - 400 K/uL 217  207  200       Latest Ref Rng & Units 02/16/2024    2:34 AM 02/15/2024    4:30 AM 02/14/2024    4:05 AM  BMP  Glucose 70 - 99 mg/dL 85  91  92   BUN 8 - 23 mg/dL 43  35  44   Creatinine 0.61 - 1.24 mg/dL 9.17  9.11  9.13   Sodium 135 - 145 mmol/L 137  138  139   Potassium 3.5 - 5.1 mmol/L 3.9  4.0  3.8   Chloride 98 - 111 mmol/L 108  107  109   CO2 22 - 32 mmol/L 21  22  21    Calcium  8.9 - 10.3 mg/dL 9.6  9.5  9.3    No results found.  Family Communication: Discussed with patient, he understand and agree. All questions answered.  Disposition: Status is: Inpatient Remains inpatient appropriate because: awaiting safe placement, TB skin test to be read tomorrow.  Planned Discharge Destination: group home/ shelter     Time spent: 25 minutes  Author: Elvan Sor, MD 02/24/2024 2:27 PM Secure chat 7am to 7pm For on call review [email or web address].

## 2024-02-24 NOTE — Plan of Care (Signed)

## 2024-02-24 NOTE — TOC Progression Note (Signed)
 Transition of Care (TOC) - Progression Note    Patient Details  Name: Anthony A Fluellen Jr. MRN: [id number] Date of Birth: 12-18-1955  Transition [facility] Care [facility]) CM/SW Contact  Guthrie Lemme A [place], LCSW Phone Number: 02/24/2024, 10:27 AM  Clinical Narrative:     CSW spoke with family care home owner, Rosana at [facility]. CSW sent updated PPD test results and updated FL2 to facility. He said that he could not take pt this week, requested Monday for discharge. He said that bed would be ready then and that he can provide transportation for pt at discharge. MD notified.   CSW will continue to follow.   Expected Discharge Plan: Skilled Nursing Facility Barriers to Discharge: Continued Medical Work up               Expected Discharge Plan and Services In-house Referral: Clinical Social Work     Living arrangements for the past 2 months: Homeless                                       Social Drivers of Health (SDOH) Interventions SDOH Screenings   Food Insecurity: Patient Unable To Answer (12/09/2023)  Housing: Patient Unable To Answer (12/09/2023)  Transportation Needs: Patient Unable To Answer (12/09/2023)  Utilities: Patient Unable To Answer (12/09/2023)  Depression (PHQ2-9): Medium Risk (10/16/2020)  Social Connections: Unknown (12/22/2023)  Tobacco Use: High Risk (12/08/2023)    Readmission Risk Interventions    01/14/2024    9:48 AM 08/27/2023   12:21 PM 08/19/2023    9:17 AM  Readmission Risk Prevention Plan  Transportation Screening Complete Complete Complete  Medication Review Oceanographer) Complete Complete Complete  PCP or Specialist appointment within 3-5 days of discharge Complete Complete   HRI or Home Care Consult Complete Complete Complete  SW Recovery Care/Counseling Consult Complete Complete Complete  Palliative Care Screening Not Applicable Not Applicable Not Applicable  Skilled Nursing Facility Not Applicable Not Applicable Not Applicable

## 2024-02-24 NOTE — Plan of Care (Signed)

## 2024-02-25 DIAGNOSIS — E162 Hypoglycemia, unspecified: Secondary | ICD-10-CM | POA: Diagnosis not present

## 2024-02-25 NOTE — Progress Notes (Signed)
 Progress Note   Patient: Anthony [name]. FMW:[id number] DOB: 1956-04-12 DOA: 12/07/2023     79 DOS: the patient was seen and examined on 02/25/2024   Brief hospital course: Lugar Raghav is a 68 y.o. male with PMH significant for homelessness, chronic alcoholism c/b withdrawals/DTs, chronic smoking, HTN, paroxysmal A-fib, h/o PE previously on Eliquis , hiatal hernia s/p Nissen fundoplication 2016, chronic low back pain, chronic anxiety, opiate withdrawal presented to the ED for confusion, low blood sugar.   During this hospitalization, he has also been seen by critical care, CHF team, GI, neurology, psychiatry and palliative care.   During this admission, he had intermittent agitation- requiring Ativan  per CIWA protocol, transferred to PCCM Patient was placed on Precedex , Librium , clonazepam ... Concluded treatment Patient was seen by GI, started on stress dose steroids.  Patient was also seen by advanced heart failure team for biventricular heart failure.     He was transferred to TRH service for further management of hypoglycemia, hyponatremia.   Per psychiatry consultation, patient lacks decision-making capacity, APS involved for placement.  However this has now resolved.  APS denied guardianship.  Pt making his own decisions. Currently stable for DC, pending safe disposition. He wishes to go to group home vs homeless shelter.   Assessment and Plan:  SIRS with hypotension: Resolved.  Completed empiric 5-day course of Zosyn .   Orthostatic hypotension Resolved after IV fluid hydration initiation of midodrine .  Compression stockings and abdominal binder on board as well. BP better, Midodrine  changed to 5 mg 3 times daily.   Acute metabolic encephalopathy secondary to alcohol  withdrawal Off Precedex . Mental status back to baseline. Continue vitamin supplementation.    Hospital delirium -Resolved at this time, patient back to his baseline.   Retroperitoneal hematoma/chronic iron  deficiency  anemia/acute blood loss anemia S/p 5 units total RBCs during course of hospital stay.  Hemoglobin stable.  No active bleeding.  Eliquis  resumed.   A-fib with RVR Initially on Cardizem  drip.  Subsequently discontinued, transition to amiodarone  p.o. Eliquis .  Rate appears well-controlled at this time.   HFrEF EF 30-40%.  GDMT limited by hypotension.   Hyponatremia Responding well to urea  powder.  Sodium normal.   Hypokalemia/hypomagnesemia Supplementation on board.  Checking intermittently.   Acute urinary retention Flomax  initiated on 7/30.  Monitor response and blood pressure closely.   Goals of care Patient was homeless, TOC working on discharge plan. Per psychiatry 6/15 the patient lacks capacity for decision-making. However, pt is now very conversive, oriented, able to make his own decisions.   APS involved cannot pursue guardianship.  He wishes to go to group home vs homeless shelter. TOC working on a group home. TB skin test order placed for placement purposes to be read in 48 hrs.     Out of bed to chair. Incentive spirometry. Nursing supportive care. Fall, aspiration precautions. Diet:  Diet Orders (From admission, onward)     Start     Ordered   01/06/24 1359  Diet regular Room service appropriate? Yes; Fluid consistency: Thin  Diet effective now       Question Answer Comment  Room service appropriate? Yes   Fluid consistency: Thin      01/06/24 1358           DVT prophylaxis: Place and maintain sequential compression device Start: 01/30/24 1209 Place TED hose Start: 01/30/24 1209 apixaban  (ELIQUIS ) tablet 5 mg  Level of care: Med-Surg   Code Status: Limited: Do not attempt resuscitation (DNR) -DNR-LIMITED -Do Not Intubate/DNI  Subjective: Patient was seen and examined today at bedside.  No significant events overnight.  Patient was resting comfortably in the bed.  Patient is aware that he will be discharged on Monday.   Physical Exam: Vitals:    02/24/24 1321 02/24/24 1559 02/25/24 0804 02/25/24 1153  BP: 120/79 112/89 118/83 109/78  Pulse: 100 (!) 105 82 (!) 107  Resp:   16 18  Temp: 97.6 F (36.4 C) 97.7 F (36.5 C) (!) 97.3 F (36.3 C) (!) 97.4 F (36.3 C)  TempSrc:   Oral   SpO2: 99% 99% 99% 99%  Weight:      Height:        General - Elderly Caucasian male, no apparent distress HEENT - PERRLA, EOMI, atraumatic head, non tender sinuses. Lung - Clear, basal rales, rhonchi, no wheezes. Heart - S1, S2 heard, no murmurs, rubs, trace pedal edema. Abdomen - Soft, non tender, bowel sounds good Neuro - Alert, awake and oriented x 3, non focal exam. Skin - Warm and dry.  Data Reviewed:      Latest Ref Rng & Units 02/16/2024    2:34 AM 02/15/2024    4:30 AM 02/14/2024    4:05 AM  CBC  WBC 4.0 - 10.5 K/uL 5.4  5.3  4.1   Hemoglobin 13.0 - 17.0 g/dL 88.0  87.7  88.2   Hematocrit 39.0 - 52.0 % 36.1  36.9  35.4   Platelets 150 - 400 K/uL 217  207  200       Latest Ref Rng & Units 02/16/2024    2:34 AM 02/15/2024    4:30 AM 02/14/2024    4:05 AM  BMP  Glucose 70 - 99 mg/dL 85  91  92   BUN 8 - 23 mg/dL 43  35  44   Creatinine 0.61 - 1.24 mg/dL 9.17  9.11  9.13   Sodium 135 - 145 mmol/L 137  138  139   Potassium 3.5 - 5.1 mmol/L 3.9  4.0  3.8   Chloride 98 - 111 mmol/L 108  107  109   CO2 22 - 32 mmol/L 21  22  21    Calcium  8.9 - 10.3 mg/dL 9.6  9.5  9.3    No results found.  Family Communication: Discussed with patient, he understand and agree. All questions answered.  Disposition: Status is: Inpatient Remains inpatient appropriate because: awaiting safe placement, TB skin test to be read tomorrow.  Planned Discharge Destination: group home/ shelter     Time spent: 25 minutes  Author: Elvan Sor, MD 02/25/2024 4:29 PM Secure chat 7am to 7pm For on call review [email or web address]

## 2024-02-26 DIAGNOSIS — E162 Hypoglycemia, unspecified: Secondary | ICD-10-CM | POA: Diagnosis not present

## 2024-02-26 NOTE — Progress Notes (Signed)
 Progress Note   Patient: Anthony A Bise Jr. FMW:[id number] DOB: 02-27-1956 DOA: 12/07/2023     80 DOS: the patient was seen and examined on 02/26/2024   Brief hospital course: Pineau Donterrius is a 68 y.o. male with PMH significant for homelessness, chronic alcoholism c/b withdrawals/DTs, chronic smoking, HTN, paroxysmal A-fib, h/o PE previously on Eliquis , hiatal hernia s/p Nissen fundoplication 2016, chronic low back pain, chronic anxiety, opiate withdrawal presented to the ED for confusion, low blood sugar.   During this hospitalization, he has also been seen by critical care, CHF team, GI, neurology, psychiatry and palliative care.   During this admission, he had intermittent agitation- requiring Ativan  per CIWA protocol, transferred to [facility] Patient was placed on Precedex , Librium , clonazepam ... Concluded treatment Patient was seen by GI, started on stress dose steroids.  Patient was also seen by advanced heart failure team for biventricular heart failure.     He was transferred to TRH service for further management of hypoglycemia, hyponatremia.   Per psychiatry consultation, patient lacks decision-making capacity, APS involved for placement.  However this has now resolved.  APS denied guardianship.  Pt making his own decisions. Currently stable for DC, pending safe disposition. He wishes to go to group home vs homeless shelter.   Assessment and Plan:  SIRS with hypotension: Resolved.  Completed empiric 5-day course of Zosyn .   Orthostatic hypotension Resolved after IV fluid hydration initiation of midodrine .  Compression stockings and abdominal binder on board as well. BP better, Midodrine  changed to 5 mg 3 times daily.   Acute metabolic encephalopathy secondary to alcohol  withdrawal Off Precedex . Mental status back to baseline. Continue vitamin supplementation.    Hospital delirium -Resolved at this time, patient back to his baseline.   Retroperitoneal hematoma/chronic iron  deficiency  anemia/acute blood loss anemia S/p 5 units total RBCs during course of hospital stay.  Hemoglobin stable.  No active bleeding.  Eliquis  resumed.   A-fib with RVR Initially on Cardizem  drip.  Subsequently discontinued, transition to amiodarone  p.o. Eliquis .  Rate appears well-controlled at this time.   HFrEF EF 30-40%.  GDMT limited by hypotension.   Hyponatremia Responding well to urea  powder.  Sodium normal.   Hypokalemia/hypomagnesemia Supplementation on board.  Checking intermittently.   Acute urinary retention Flomax  initiated on 7/30.  Monitor response and blood pressure closely.   Goals of care Patient was homeless, TOC working on discharge plan. Per psychiatry 6/15 the patient lacks capacity for decision-making. However, pt is now very conversive, oriented, able to make his own decisions.   APS involved cannot pursue guardianship.  He wishes to go to group home vs homeless shelter. TOC working on a group home. TB skin test order placed for placement purposes to be read in 48 hrs.     Out of bed to chair. Incentive spirometry. Nursing supportive care. Fall, aspiration precautions. Diet:  Diet Orders (From admission, onward)     Start     Ordered   01/06/24 1359  Diet regular Room service appropriate? Yes; Fluid consistency: Thin  Diet effective now       Question Answer Comment  Room service appropriate? Yes   Fluid consistency: Thin      01/06/24 1358           DVT prophylaxis: Place and maintain sequential compression device Start: 01/30/24 1209 Place TED hose Start: 01/30/24 1209 apixaban  (ELIQUIS ) tablet 5 mg  Level of care: Med-Surg   Code Status: Limited: Do not attempt resuscitation (DNR) -DNR-LIMITED -Do Not Intubate/DNI  Subjective: Patient was seen and examined today at bedside.  No significant events overnight.   Patient was ambulatory and he was busy talking to another patient beside his room.   Physical Exam: Vitals:   02/25/24 1833 02/25/24  2110 02/26/24 0514 02/26/24 0804  BP: 115/84 133/87 (!) 138/98 (!) 154/110  Pulse: 97 95 100 (!) 105  Resp:  16 17 17   Temp:  (!) 97.4 F (36.3 C) 97.6 F (36.4 C) (!) 97.3 F (36.3 C)  TempSrc:  Oral Oral   SpO2:  100% 95% 100%  Weight:      Height:        General - Elderly Caucasian male, no apparent distress HEENT - PERRLA, EOMI, atraumatic head, non tender sinuses. Lung - Clear, basal rales, rhonchi, no wheezes. Heart - S1, S2 heard, no murmurs, rubs, trace pedal edema. Abdomen - Soft, non tender, bowel sounds good Neuro - Alert, awake and oriented x 3, non focal exam. Skin - Warm and dry.  Data Reviewed:      Latest Ref Rng & Units 02/16/2024    2:34 AM 02/15/2024    4:30 AM 02/14/2024    4:05 AM  CBC  WBC 4.0 - 10.5 K/uL 5.4  5.3  4.1   Hemoglobin 13.0 - 17.0 g/dL 88.0  87.7  88.2   Hematocrit 39.0 - 52.0 % 36.1  36.9  35.4   Platelets 150 - 400 K/uL 217  207  200       Latest Ref Rng & Units 02/16/2024    2:34 AM 02/15/2024    4:30 AM 02/14/2024    4:05 AM  BMP  Glucose 70 - 99 mg/dL 85  91  92   BUN 8 - 23 mg/dL 43  35  44   Creatinine 0.61 - 1.24 mg/dL 9.17  9.11  9.13   Sodium 135 - 145 mmol/L 137  138  139   Potassium 3.5 - 5.1 mmol/L 3.9  4.0  3.8   Chloride 98 - 111 mmol/L 108  107  109   CO2 22 - 32 mmol/L 21  22  21    Calcium  8.9 - 10.3 mg/dL 9.6  9.5  9.3    No results found.  Family Communication: Discussed with patient, he understand and agree. All questions answered.  Disposition: Status is: Inpatient Remains inpatient appropriate because: awaiting safe placement, TB skin test to be read tomorrow.  Planned Discharge Destination: group home/ shelter     Time spent: 25 minutes  Author: Elvan Sor, MD 02/26/2024 12:45 PM Secure chat 7am to 7pm For on call review [email or web address].

## 2024-02-26 NOTE — Plan of Care (Signed)
  Problem: Education: Goal: Knowledge of General Education information will improve Description: Including pain rating scale, medication(s)/side effects and non-pharmacologic comfort measures Outcome: Progressing   Problem: Clinical Measurements: Goal: Will remain free from infection Outcome: Progressing   Problem: Nutrition: Goal: Adequate nutrition will be maintained Outcome: Progressing   Problem: Safety: Goal: Ability to remain free from injury will improve Outcome: Progressing

## 2024-02-27 DIAGNOSIS — E162 Hypoglycemia, unspecified: Secondary | ICD-10-CM | POA: Diagnosis not present

## 2024-02-27 NOTE — Plan of Care (Signed)

## 2024-02-27 NOTE — Progress Notes (Signed)
 Progress Note   Patient: Anthony [name]. FMW:[id number] DOB: 04/02/1956 DOA: 12/07/2023     81 DOS: the patient was seen and examined on 02/27/2024   Brief hospital course: [place] Piers is a 68 y.o. male with PMH significant for homelessness, chronic alcoholism c/b withdrawals/DTs, chronic smoking, HTN, paroxysmal A-fib, h/o PE previously on Eliquis , hiatal hernia s/p Nissen fundoplication 2016, chronic low back pain, chronic anxiety, opiate withdrawal presented to the ED for confusion, low blood sugar.   During this hospitalization, he has also been seen by critical care, CHF team, GI, neurology, psychiatry and palliative care.   During this admission, he had intermittent agitation- requiring Ativan  per CIWA protocol, transferred to [facility] Patient was placed on Precedex , Librium , clonazepam ... Concluded treatment Patient was seen by GI, started on stress dose steroids.  Patient was also seen by advanced heart failure team for biventricular heart failure.     He was transferred to TRH service for further management of hypoglycemia, hyponatremia.   Per psychiatry consultation, patient lacks decision-making capacity, APS involved for placement.  However this has now resolved.  APS denied guardianship.  Pt making his own decisions. Currently stable for DC, pending safe disposition. He wishes to go to group home vs homeless shelter.   Assessment and Plan:  SIRS with hypotension: Resolved.  Completed empiric 5-day course of Zosyn .   Orthostatic hypotension Resolved after IV fluid hydration initiation of midodrine .  Compression stockings and abdominal binder on board as well. BP better, Midodrine  changed to 5 mg 3 times daily.   Acute metabolic encephalopathy secondary to alcohol  withdrawal Off Precedex . Mental status back to baseline. Continue vitamin supplementation.    Hospital delirium -Resolved at this time, patient back to his baseline.   Retroperitoneal hematoma/chronic iron  deficiency  anemia/acute blood loss anemia S/p 5 units total RBCs during course of hospital stay.  Hemoglobin stable.  No active bleeding.  Eliquis  resumed.   A-fib with RVR Initially on Cardizem  drip.  Subsequently discontinued, transition to amiodarone  p.o. Eliquis .  Rate appears well-controlled at this time.   HFrEF EF 30-40%.  GDMT limited by hypotension.   Hyponatremia Responding well to urea  powder.  Sodium normal.   Hypokalemia/hypomagnesemia Supplementation on board.  Checking intermittently.   Acute urinary retention Flomax  initiated on 7/30.  Monitor response and blood pressure closely.   Goals of care Patient was homeless, TOC working on discharge plan. Per psychiatry 6/15 the patient lacks capacity for decision-making. However, pt is now very conversive, oriented, able to make his own decisions.   APS involved cannot pursue guardianship.  He wishes to go to group home vs homeless shelter. TOC working on a group home. TB skin test done on 8/4       Out of bed to chair. Incentive spirometry. Nursing supportive care. Fall, aspiration precautions. Diet:  Diet Orders (From admission, onward)     Start     Ordered   01/06/24 1359  Diet regular Room service appropriate? Yes; Fluid consistency: Thin  Diet effective now       Question Answer Comment  Room service appropriate? Yes   Fluid consistency: Thin      01/06/24 1358           DVT prophylaxis: Place and maintain sequential compression device Start: 01/30/24 1209 Place TED hose Start: 01/30/24 1209 apixaban  (ELIQUIS ) tablet 5 mg  Level of care: Med-Surg   Code Status: Limited: Do not attempt resuscitation (DNR) -DNR-LIMITED -Do Not Intubate/DNI   Subjective: Patient was seen  and examined today at bedside.  No significant events overnight.     Physical Exam: Vitals:   02/27/24 0046 02/27/24 0531 02/27/24 0754 02/27/24 1201  BP: 122/85 (!) 145/105 (!) 132/96 (!) 129/97  Pulse: 92 96 (!) 104 90  Resp: 16 15 17 17    Temp: (!) 97.4 F (36.3 C) (!) 97.4 F (36.3 C) 97.6 F (36.4 C) 98.1 F (36.7 C)  TempSrc:      SpO2: 99% 100% 99% 100%  Weight:      Height:        General - Elderly Caucasian male, no apparent distress HEENT - PERRLA, EOMI, atraumatic head, non tender sinuses. Lung - Clear, basal rales, rhonchi, no wheezes. Heart - S1, S2 heard, no murmurs, rubs, trace pedal edema. Abdomen - Soft, non tender, bowel sounds good Neuro - Alert, awake and oriented x 3, non focal exam. Skin - Warm and dry.  Data Reviewed:      Latest Ref Rng & Units 02/16/2024    2:34 AM 02/15/2024    4:30 AM 02/14/2024    4:05 AM  CBC  WBC 4.0 - 10.5 K/uL 5.4  5.3  4.1   Hemoglobin 13.0 - 17.0 g/dL 88.0  87.7  88.2   Hematocrit 39.0 - 52.0 % 36.1  36.9  35.4   Platelets 150 - 400 K/uL 217  207  200       Latest Ref Rng & Units 02/16/2024    2:34 AM 02/15/2024    4:30 AM 02/14/2024    4:05 AM  BMP  Glucose 70 - 99 mg/dL 85  91  92   BUN 8 - 23 mg/dL 43  35  44   Creatinine 0.61 - 1.24 mg/dL 9.17  9.11  9.13   Sodium 135 - 145 mmol/L 137  138  139   Potassium 3.5 - 5.1 mmol/L 3.9  4.0  3.8   Chloride 98 - 111 mmol/L 108  107  109   CO2 22 - 32 mmol/L 21  22  21    Calcium  8.9 - 10.3 mg/dL 9.6  9.5  9.3    No results found.  Family Communication: Discussed with patient, he understand and agree. All questions answered.  Disposition: Status is: Inpatient Remains inpatient appropriate because: awaiting safe placement, TB skin test done  Planned Discharge Destination: group home/ shelter 8/10 follow-up with TOC, patient can be discharged tomorrow to the family care home if they have a bed available.     Time spent: 25 minutes  Author: Elvan Sor, MD 02/27/2024 1:53 PM Secure chat 7am to 7pm For on call review [email or web address]

## 2024-02-28 ENCOUNTER — Other Ambulatory Visit (HOSPITAL_COMMUNITY): Payer: Self-pay

## 2024-02-28 DIAGNOSIS — E162 Hypoglycemia, unspecified: Secondary | ICD-10-CM | POA: Diagnosis not present

## 2024-02-28 MED ORDER — UREA 15 G PO PACK
15.0000 g | PACK | Freq: Two times a day (BID) | ORAL | 0 refills | Status: AC
  Filled 2024-02-28: qty 30, 15d supply, fill #0

## 2024-02-28 MED ORDER — MOMETASONE FURO-FORMOTEROL FUM 200-5 MCG/ACT IN AERO
2.0000 | INHALATION_SPRAY | Freq: Two times a day (BID) | RESPIRATORY_TRACT | 0 refills | Status: AC
  Filled 2024-02-28: qty 13, 30d supply, fill #0

## 2024-02-28 MED ORDER — GERHARDT'S BUTT CREAM
1.0000 | TOPICAL_CREAM | Freq: Two times a day (BID) | CUTANEOUS | 0 refills | Status: AC
Start: 2024-02-28 — End: ?
  Filled 2024-02-28: qty 60, 30d supply, fill #0

## 2024-02-28 MED ORDER — AMIODARONE HCL 200 MG PO TABS
200.0000 mg | ORAL_TABLET | Freq: Two times a day (BID) | ORAL | 0 refills | Status: DC
Start: 2024-02-28 — End: 2024-02-28
  Filled 2024-02-28: qty 60, 30d supply, fill #0

## 2024-02-28 MED ORDER — AMIODARONE HCL 200 MG PO TABS
200.0000 mg | ORAL_TABLET | Freq: Every day | ORAL | 2 refills | Status: AC
  Filled 2024-02-28: qty 30, 30d supply, fill #0

## 2024-02-28 MED ORDER — MAGNESIUM CHLORIDE 64 MG PO TBEC
2.0000 | DELAYED_RELEASE_TABLET | Freq: Every day | ORAL | 0 refills | Status: AC
  Filled 2024-02-28: qty 30, 15d supply, fill #0

## 2024-02-28 MED ORDER — SENNOSIDES-DOCUSATE SODIUM 8.6-50 MG PO TABS
1.0000 | ORAL_TABLET | Freq: Every evening | ORAL | 0 refills | Status: AC | PRN
  Filled 2024-02-28: qty 30, 30d supply, fill #0

## 2024-02-28 MED ORDER — FOLIC ACID 1 MG PO TABS
1.0000 mg | ORAL_TABLET | Freq: Every day | ORAL | 0 refills | Status: AC
  Filled 2024-02-28: qty 30, 30d supply, fill #0

## 2024-02-28 MED ORDER — ROSUVASTATIN CALCIUM 10 MG PO TABS
10.0000 mg | ORAL_TABLET | Freq: Every day | ORAL | 0 refills | Status: AC
  Filled 2024-02-28: qty 30, 30d supply, fill #0

## 2024-02-28 MED ORDER — TAMSULOSIN HCL 0.4 MG PO CAPS
0.4000 mg | ORAL_CAPSULE | Freq: Every day | ORAL | 0 refills | Status: AC
  Filled 2024-02-28: qty 30, 30d supply, fill #0

## 2024-02-28 MED ORDER — FERROUS SULFATE 325 (65 FE) MG PO TABS
325.0000 mg | ORAL_TABLET | Freq: Every day | ORAL | 0 refills | Status: AC
  Filled 2024-02-28: qty 30, 30d supply, fill #0

## 2024-02-28 MED ORDER — APIXABAN 5 MG PO TABS
5.0000 mg | ORAL_TABLET | Freq: Two times a day (BID) | ORAL | 0 refills | Status: AC
  Filled 2024-02-28: qty 60, 30d supply, fill #0

## 2024-02-28 MED ORDER — THIAMINE HCL 100 MG PO TABS
100.0000 mg | ORAL_TABLET | Freq: Every day | ORAL | 0 refills | Status: AC
  Filled 2024-02-28: qty 30, 30d supply, fill #0

## 2024-02-28 MED ORDER — FAMOTIDINE 20 MG PO TABS
20.0000 mg | ORAL_TABLET | Freq: Two times a day (BID) | ORAL | 0 refills | Status: AC
  Filled 2024-02-28: qty 30, 15d supply, fill #0

## 2024-02-28 MED ORDER — ADULT MULTIVITAMIN W/MINERALS CH
1.0000 | ORAL_TABLET | Freq: Every day | ORAL | 0 refills | Status: AC
Start: 2024-02-29 — End: ?
  Filled 2024-02-28: qty 30, 30d supply, fill #0

## 2024-02-28 MED ORDER — MIDODRINE HCL 5 MG PO TABS
5.0000 mg | ORAL_TABLET | Freq: Three times a day (TID) | ORAL | 0 refills | Status: AC
  Filled 2024-02-28: qty 30, 10d supply, fill #0

## 2024-02-28 MED ORDER — OLANZAPINE 5 MG PO TABS
5.0000 mg | ORAL_TABLET | Freq: Every day | ORAL | 0 refills | Status: AC
  Filled 2024-02-28: qty 30, 30d supply, fill #0

## 2024-02-28 MED ORDER — JUVEN PO PACK
1.0000 | PACK | Freq: Two times a day (BID) | ORAL | 0 refills | Status: AC
  Filled 2024-02-28: qty 15, 8d supply, fill #0

## 2024-02-28 NOTE — Discharge Summary (Addendum)
 Physician Discharge Summary   Patient: Anthony A Lemaster Jr. MRN: [id number] DOB: [birth year]  Admit date:     12/07/2023  Discharge date: 02/28/24  Discharge Physician: Deliliah Room   PCP: Pcp, No   Recommendations at discharge:    Follow up with your PCP in one week. Continue taking meds as prescribed Return to ED if you develop chest pain, fever or shortness of breath.  Discharge Diagnoses: Principal Problem:   Hypoglycemia Active Problems:   Paroxysmal atrial fibrillation (HCC)   Hyponatremia   HTN (hypertension)   Macrocytic anemia   History of pulmonary embolus (PE)   Alcohol  withdrawal (HCC)   Alcohol  withdrawal delirium (HCC)   Moderate protein malnutrition (HCC)   Lactic acidosis   Acute delirium   Atrial fibrillation with RVR [facility])    Hospital Course:  SIRS with hypotension: Resolved.  Completed empiric 5-day course of Zosyn .   Orthostatic hypotension Resolved after IV fluid hydration initiation of midodrine .  Compression stockings and abdominal binder on board as well. BP better, Midodrine  changed to 5 mg 3 times daily. Stopped beta blockers   Acute metabolic encephalopathy: secondary to alcohol  withdrawal Off Precedex . Mental status back to baseline. Continue vitamin supplementation.    Hospital delirium -Resolved at this time, patient back to his baseline.   Retroperitoneal hematoma/chronic iron  deficiency anemia/acute blood loss anemia S/p 5 units total RBCs during course of hospital stay.  Hemoglobin stable.  No active bleeding.  Eliquis  resumed.   A-fib with RVR, rate controlled Initially on Cardizem  drip.  Subsequently discontinued, transitioned to amiodarone  and  p.o. Eliquis .  Rate appears well-controlled at this time.   Chronic HFrEF., NYHA Class III EF 30-40%.  GDMT limited by hypotension.   Hyponatremia Responding well to urea  powder.  Sodium normal.   Hypokalemia/hypomagnesemia Prn supplementation   Acute urinary retention Flomax   initiated on 7/30.   [facility]        Consultants: critical care, CHF team, GI, neurology, psychiatry and palliative care  Procedures performed: None  Disposition: Group home Diet recommendation:  Regular diet DISCHARGE MEDICATION: Allergies as of 02/28/2024   No Active Allergies      Medication List     STOP taking these medications    magnesium  oxide 400 (240 Mg) MG tablet Commonly known as: MAG-OX   metoprolol  succinate 25 MG 24 hr tablet Commonly known as: TOPROL -XL   pantoprazole  40 MG tablet Commonly known as: PROTONIX        TAKE these medications    acetaminophen  500 MG tablet Commonly known as: TYLENOL  Take 1 tablet (500 mg total) by mouth every 8 (eight) hours as needed. What changed:  how much to take when to take this reasons to take this   amiodarone  200 MG tablet Commonly known as: PACERONE  Take 1 tablet (200 mg total) by mouth daily.   apixaban  5 MG Tabs tablet Commonly known as: ELIQUIS  Take 1 tablet (5 mg total) by mouth 2 (two) times daily.   famotidine  20 MG tablet Commonly known as: PEPCID  Take 1 tablet (20 mg total) by mouth 2 (two) times daily.   ferrous sulfate  325 (65 FE) MG tablet Take 1 tablet (325 mg total) by mouth daily with breakfast.   folic acid  1 MG tablet Commonly known as: FOLVITE  Take 1 tablet (1 mg total) by mouth daily.   Gerhardt's butt cream Crea Apply 1 Application topically 2 (two) times daily.   magnesium  chloride 64 MG Tbec SR tablet Commonly known as: SLOW-MAG  Take 2 tablets (128 mg total) by mouth daily. Start taking on: February 29, 2024   midodrine  5 MG tablet Commonly known as: PROAMATINE  Take 1 tablet (5 mg total) by mouth 3 (three) times daily with meals.   mometasone -formoterol  200-5 MCG/ACT Aero Commonly known as: DULERA  Inhale 2 puffs into the lungs 2 (two) times daily.   multivitamin with minerals Tabs tablet Take 1 tablet by mouth daily. Start taking on:  February 29, 2024   nutrition supplement (JUVEN) Pack Take 1 packet by mouth 2 (two) times daily between meals.   OLANZapine  5 MG tablet Commonly known as: ZYPREXA  Take 1 tablet (5 mg total) by mouth at bedtime. What changed: Another medication with the same name was removed. Continue taking this medication, and follow the directions you see here.   rosuvastatin  10 MG tablet Commonly known as: CRESTOR  Take 1 tablet (10 mg total) by mouth daily.   senna-docusate 8.6-50 MG tablet Commonly known as: Senokot-S Take 1 tablet by mouth at bedtime as needed for mild constipation.   tamsulosin  0.4 MG Caps capsule Commonly known as: FLOMAX  Take 1 capsule (0.4 mg total) by mouth daily. Start taking on: February 29, 2024   thiamine  100 MG tablet Commonly known as: VITAMIN B1 Take 1 tablet (100 mg total) by mouth daily.   urea  15 g Pack oral packet Commonly known as: URE-NA Take 15 g by mouth 2 (two) times daily.               Durable Medical Equipment  (From admission, onward)           Start     Ordered   01/14/24 0921  For home use only DME Walker rolling  Once       Question Answer Comment  Walker: With 5 Inch Wheels   Patient needs a walker to treat with the following condition Acute metabolic encephalopathy   Patient needs a walker to treat with the following condition Physical deconditioning      01/14/24 0921            Follow-up Information     [facility] [employer] Call.   Why: Please call the office and schedule a hospital follow up in the next 7-10 days. Contact information: [address] [place] [address] [phone]                Discharge Exam: Filed Weights   01/30/24 0400 02/02/24 1628 02/24/24 0500  Weight: 68.4 kg 65.7 kg 69 kg   Constitutional: NAD, calm, comfortable Eyes: PERRL, lids and conjunctivae normal ENMT: Mucous membranes are moist. Posterior pharynx clear of any exudate or lesions.Normal dentition.  Neck: normal,  supple, no masses, no thyromegaly Respiratory: clear to auscultation bilaterally, no wheezing, no crackles. Normal respiratory effort. No accessory muscle use.  Cardiovascular: Regular rate and rhythm, no murmurs / rubs / gallops. No extremity edema. 2+ pedal pulses. No carotid bruits.  Abdomen: no tenderness, no masses palpated. No hepatosplenomegaly. Bowel sounds positive.  Musculoskeletal: no clubbing / cyanosis. No joint deformity upper and lower extremities. Good ROM, no contractures. Normal muscle tone.  Skin: no rashes, lesions, ulcers. No induration Neurologic: CN 2-12 grossly intact. Sensation intact, DTR normal. Strength 5/5 x all 4 extremities.  Psychiatric: Normal judgment and insight. Alert and oriented x 3. Normal mood.    Condition at discharge: good  The results of significant diagnostics from this hospitalization (including imaging, microbiology, ancillary and laboratory) are listed below for reference.   Imaging  Studies: No results found.  Microbiology: Results for orders placed or performed during the hospital encounter of 12/07/23  MRSA Next Gen by PCR, Nasal     Status: None   Collection Time: 12/08/23 11:58 PM   Specimen: Nasal Mucosa; Nasal Swab  Result Value Ref Range Status   MRSA by PCR Next Gen NOT DETECTED NOT DETECTED Final    Comment: (NOTE) The GeneXpert MRSA Assay (FDA approved for NASAL specimens only), is one component of a comprehensive MRSA colonization surveillance program. It is not intended to diagnose MRSA infection nor to guide or monitor treatment for MRSA infections. Test performance is not FDA approved in patients less than 59 years old. Performed at [facility], [address], [place], [place] [address]   Culture, blood (Routine X 2) w Reflex to ID Panel     Status: None   Collection Time: 12/09/23  2:08 AM   Specimen: BLOOD RIGHT HAND  Result Value Ref Range Status   Specimen Description BLOOD RIGHT HAND  Final   Special  Requests   Final    BOTTLES DRAWN AEROBIC AND ANAEROBIC Blood Culture adequate volume   Culture   Final    NO GROWTH 5 DAYS Performed at [facility], [address], [place], [place] [address]    Report Status 12/14/2023 FINAL  Final  Culture, blood (Routine X 2) w Reflex to ID Panel     Status: Abnormal   Collection Time: 12/09/23  2:11 AM   Specimen: BLOOD RIGHT ARM  Result Value Ref Range Status   Specimen Description BLOOD RIGHT ARM  Final   Special Requests   Final    BOTTLES DRAWN AEROBIC AND ANAEROBIC Blood Culture results may not be optimal due to an inadequate volume of blood received in culture bottles   Culture  Setup Time   Final    GRAM POSITIVE RODS AEROBIC BOTTLE ONLY CORRECTED RESULTS PREVIOUSLY REPORTED AS: GRAM NEGATIVE RODS CORRECTED RESULTS CALLED TO: [facility] PHARMD AT 1403 12/13/23 D. VANHOOK    Culture (A)  Final    DIPHTHEROIDS(CORYNEBACTERIUM SPECIES) Standardized susceptibility testing for this organism is not available. Performed at [facility], [address] [place], [place] [address]    Report Status 12/13/2023 FINAL  Final  Blood Culture ID Panel (Reflexed)     Status: None   Collection Time: 12/09/23  2:11 AM  Result Value Ref Range Status   Enterococcus faecalis NOT DETECTED NOT DETECTED Final   Enterococcus Faecium NOT DETECTED NOT DETECTED Final   Listeria monocytogenes NOT DETECTED NOT DETECTED Final   Staphylococcus species NOT DETECTED NOT DETECTED Final   Staphylococcus aureus (BCID) NOT DETECTED NOT DETECTED Final   Staphylococcus epidermidis NOT DETECTED NOT DETECTED Final   Staphylococcus lugdunensis NOT DETECTED NOT DETECTED Final   Streptococcus species NOT DETECTED NOT DETECTED Final   Streptococcus agalactiae NOT DETECTED NOT DETECTED Final   Streptococcus pneumoniae NOT DETECTED NOT DETECTED Final   Streptococcus pyogenes NOT DETECTED NOT DETECTED Final   A.calcoaceticus-baumannii NOT DETECTED NOT DETECTED Final    Bacteroides fragilis NOT DETECTED NOT DETECTED Final   Enterobacterales NOT DETECTED NOT DETECTED Final   Enterobacter cloacae complex NOT DETECTED NOT DETECTED Final   Escherichia coli NOT DETECTED NOT DETECTED Final   Klebsiella aerogenes NOT DETECTED NOT DETECTED Final   Klebsiella oxytoca NOT DETECTED NOT DETECTED Final   Klebsiella pneumoniae NOT DETECTED NOT DETECTED Final   Proteus species NOT DETECTED NOT DETECTED Final  Salmonella species NOT DETECTED NOT DETECTED Final   Serratia marcescens NOT DETECTED NOT DETECTED Final   Haemophilus influenzae NOT DETECTED NOT DETECTED Final   Neisseria meningitidis NOT DETECTED NOT DETECTED Final   Pseudomonas aeruginosa NOT DETECTED NOT DETECTED Final   Stenotrophomonas maltophilia NOT DETECTED NOT DETECTED Final   Candida albicans NOT DETECTED NOT DETECTED Final   Candida auris NOT DETECTED NOT DETECTED Final   Candida glabrata NOT DETECTED NOT DETECTED Final   Candida krusei NOT DETECTED NOT DETECTED Final   Candida parapsilosis NOT DETECTED NOT DETECTED Final   Candida tropicalis NOT DETECTED NOT DETECTED Final   Cryptococcus neoformans/gattii NOT DETECTED NOT DETECTED Final    Comment: Performed at [facility], [address], [place], [place] [address]  Respiratory (~20 pathogens) panel by PCR     Status: Abnormal   Collection Time: 12/09/23 10:07 AM   Specimen: Nasopharyngeal Swab; Respiratory  Result Value Ref Range Status   Adenovirus NOT DETECTED NOT DETECTED Final   Coronavirus 229E DETECTED (A) NOT DETECTED Final    Comment: (NOTE) The Coronavirus on the Respiratory Panel, DOES NOT test for the novel  Coronavirus (2019 nCoV)    Coronavirus HKU1 NOT DETECTED NOT DETECTED Final   Coronavirus NL63 NOT DETECTED NOT DETECTED Final   Coronavirus OC43 NOT DETECTED NOT DETECTED Final   Metapneumovirus NOT DETECTED NOT DETECTED Final   Rhinovirus / Enterovirus NOT DETECTED NOT DETECTED Final   Influenza A NOT DETECTED NOT  DETECTED Final   Influenza B NOT DETECTED NOT DETECTED Final   Parainfluenza Virus 1 NOT DETECTED NOT DETECTED Final   Parainfluenza Virus 2 NOT DETECTED NOT DETECTED Final   Parainfluenza Virus 3 NOT DETECTED NOT DETECTED Final   Parainfluenza Virus 4 NOT DETECTED NOT DETECTED Final   Respiratory Syncytial Virus NOT DETECTED NOT DETECTED Final   Bordetella pertussis NOT DETECTED NOT DETECTED Final   Bordetella Parapertussis NOT DETECTED NOT DETECTED Final   Chlamydophila pneumoniae NOT DETECTED NOT DETECTED Final   Mycoplasma pneumoniae NOT DETECTED NOT DETECTED Final    Comment: Performed at [facility], [address], [place], [place] [address]  Culture, blood (Routine X 2) w Reflex to ID Panel     Status: None   Collection Time: 01/02/24 12:27 PM   Specimen: BLOOD LEFT ARM  Result Value Ref Range Status   Specimen Description BLOOD LEFT ARM  Final   Special Requests   Final    BOTTLES DRAWN AEROBIC AND ANAEROBIC Blood Culture results may not be optimal due to an inadequate volume of blood received in culture bottles   Culture   Final    NO GROWTH 5 DAYS Performed at [facility], [address], [place], [place] [address]    Report Status 01/07/2024 FINAL  Final  Culture, blood (Routine X 2) w Reflex to ID Panel     Status: None   Collection Time: 01/02/24 12:27 PM   Specimen: BLOOD RIGHT ARM  Result Value Ref Range Status   Specimen Description BLOOD RIGHT ARM  Final   Special Requests   Final    BOTTLES DRAWN AEROBIC AND ANAEROBIC Blood Culture results may not be optimal due to an inadequate volume of blood received in culture bottles   Culture   Final    NO GROWTH 5 DAYS Performed at [facility], [address], [place], [place] [address]    Report Status 01/07/2024 FINAL  Final    Labs:  CBC: No results for input(s): WBC, NEUTROABS, HGB, HCT, MCV, PLT in the last 168 hours. Basic Metabolic Panel: No results for input(s): NA, K,  CL, CO2, GLUCOSE, BUN, CREATININE, CALCIUM , MG, PHOS in the last 168 hours. Liver Function Tests: No results for input(s): AST, ALT, ALKPHOS, BILITOT, PROT, ALBUMIN  in the last 168 hours. CBG: No results for input(s): GLUCAP in the last 168 hours.  Discharge time spent: 42 minutes.  Signed: Deliliah Room, MD [facility] 02/28/2024

## 2024-02-28 NOTE — NC FL2 (Signed)
 [place]  MEDICAID FL2 LEVEL OF CARE FORM     IDENTIFICATION  Patient Name: Anthony A Olthoff Jr. Birthdate: 19-Oct-1955 Sex: male Admission Date (Current Location): 12/07/2023  [facility] and [place] Number:  Producer, television/film/video and Address:  [facility], [address], [place], [place] [address]      Provider Number: [id number]  Attending Physician Name and Address:  Dino Antu, MD  Relative Name and Phone Number:       Current Level of Care: Hospital Recommended Level of Care: [facility] Prior Approval Number:    Date Approved/Denied: 12/30/23 PASRR Number: [id number]  Discharge Plan: Other (Comment) [facility] / [facility]F)    Current Diagnoses: Patient Active Problem List   Diagnosis Date Noted   Atrial fibrillation with RVR (HCC) 01/02/2024   Acute delirium 12/08/2023   Agitation 07/29/2023   History of CVA (cerebrovascular accident) 07/11/2023   Acute pulmonary embolism (HCC) 07/08/2023   Lactic acidosis 07/08/2023   Rib fractures 02/04/2023   Hypoglycemia 02/04/2023   Pulmonary embolus (HCC) 02/04/2023   Pulmonary embolism (HCC) 02/03/2023   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 difficile colitis 09/11/2022   Aspiration pneumonia (HCC) 08/27/2022   Aortic atherosclerosis (HCC) 08/27/2022   Depression, unspecified 08/09/2022   Hyponatremia 06/01/2022   Non compliance w medication regimen 05/31/2022   Delirium 05/25/2022   Stricture and stenosis of esophagus 05/21/2022   Gastritis and gastroduodenitis 05/21/2022   Loose stools 05/20/2022   Malnutrition of moderate degree 05/20/2022   Acute on chronic heart failure (HCC)     Leukocytosis 05/19/2022   Sepsis (HCC) 05/19/2022   Cellulitis of left leg, possible 05/19/2022   Moniliasis, interdigital 05/19/2022   Open toe wound, right foot, medial fourth digit 05/19/2022   Malnutrition (HCC) 05/19/2022   Alcohol  abuse 03/24/2022   Dyslipidemia 03/24/2022   Acute on chronic diastolic CHF (congestive heart failure) (HCC) 03/24/2022   Acute respiratory failure with hypoxia (HCC) 03/23/2022   Generalized weakness    Atrial fibrillation with rapid ventricular response (HCC) 03/15/2022   Hypokalemia 03/15/2022   Decreased functional mobility and endurance 06/22/2021   Iron  deficiency anemia 10/16/2020   Cough    Hypomagnesemia    Protein-calorie malnutrition, severe 08/22/2020   Alcohol  use disorder, severe, in controlled environment (HCC)    Pressure injury of skin 08/18/2020   Urinary tract infection without hematuria    Severe sepsis (HCC) 08/14/2020   Paroxysmal atrial fibrillation (HCC) 08/13/2020   Rhabdomyolysis 07/22/2020   Cerebral ventriculomegaly 07/22/2020   Hemoglobin decreased 07/22/2020   History of delirium tremons 07/22/2020   Hypoalbuminemia due to protein-calorie malnutrition (HCC) 07/22/2020   Tobacco use disorder 07/22/2020   Alcohol  withdrawal delirium (HCC)    Dehydration    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) 07/18/2018   Depression 05/26/2018   Anxiety state 04/25/2018   Need for immunization against influenza 04/25/2018   Alcohol  withdrawal (HCC) 09/01/2017   DOE (dyspnea on exertion) 11/27/2016   Actinic keratosis 11/27/2016   Macrocytic anemia 11/23/2016   History of pulmonary embolus (PE) 11/02/2016   Hiatal hernia 09/14/2016   Skin cancer 08/13/2016   Poor social situation 06/09/2013   Tinea versicolor 06/08/2013   Back pain 05/07/2013  Seborrheic dermatitis of scalp 11/08/2012   HTN (hypertension) 04/11/2012   Alcohol  use disorder, severe, dependence (HCC) 09/16/2006    Orientation RESPIRATION  BLADDER Height & Weight     Self, Time, Situation, Place  Normal Continent Weight: 69 kg Height:  5' 6 (167.6 cm)  BEHAVIORAL SYMPTOMS/MOOD NEUROLOGICAL BOWEL NUTRITION STATUS      Continent Diet (See discharge summary)  AMBULATORY STATUS COMMUNICATION OF NEEDS Skin   Supervision Verbally Normal                       Personal Care Assistance Level of Assistance  Bathing, Feeding, Dressing Bathing Assistance: Limited assistance Feeding assistance: Independent Dressing Assistance: Independent     Functional Limitations Info  Hearing, Speech Sight Info: Impaired Hearing Info: Adequate Speech Info: Adequate    SPECIAL CARE FACTORS FREQUENCY  PT (By licensed PT), OT (By licensed OT)     PT Frequency: 5x week OT Frequency: 5x week            Contractures Contractures Info: Not present    Additional Factors Info  Code Status, Allergies Code Status Info: DNR Allergies Info: NKDA Psychotropic Info: Zyprexa          Current Medications (02/28/2024):  This is the current hospital active medication list Current Facility-Administered Medications  Medication Dose Route Frequency Provider Last Rate Last Admin   acetaminophen  (TYLENOL ) tablet 650 mg  650 mg Oral Q6H PRN Kandis Devaughn Sayres, MD   650 mg at 02/28/24 9082   amiodarone  (PACERONE ) tablet 200 mg  200 mg Oral BID Kandis Devaughn Sayres, MD   200 mg at 02/28/24 9089   apixaban  (ELIQUIS ) tablet 5 mg  5 mg Oral BID Sreeram, Narendranath, MD   5 mg at 02/28/24 9090   famotidine  (PEPCID ) tablet 20 mg  20 mg Oral BID Patel, Pranav M, MD   20 mg at 02/28/24 0910   feeding supplement (ENSURE PLUS HIGH PROTEIN) liquid 237 mL  237 mL Oral BID BM Patel, Pranav M, MD   237 mL at 02/28/24 9089   Gerhardt's butt cream   Topical BID Claudene Toribio BROCKS, MD   Given at 02/27/24 2120   hydrOXYzine  (ATARAX ) tablet 25 mg  25 mg Oral TID PRN Patel, Pranav M, MD   25 mg at 02/28/24 0917   lidocaine  (LIDODERM ) 5 % 1 patch  1 patch Transdermal  Q24H Claudene Toribio BROCKS, MD   1 patch at 02/28/24 0910   magnesium  chloride (SLOW-MAG) 64 MG SR tablet 128 mg  2 tablet Oral Daily Patel, Pranav M, MD   128 mg at 02/28/24 0910   methocarbamol  (ROBAXIN ) tablet 500 mg  500 mg Oral Q8H PRN Dorinda Homans T, MD   500 mg at 02/28/24 9082   midodrine  (PROAMATINE ) tablet 5 mg  5 mg Oral TID WC Sreeram, Narendranath, MD   5 mg at 02/25/24 1833   multivitamin with minerals tablet 1 tablet  1 tablet Oral Daily Dahal, Chapman, MD   1 tablet at 02/28/24 0910   naphazoline-glycerin  (CLEAR EYES REDNESS) ophth solution 1-2 drop  1-2 drop Both Eyes QID PRN Dahal, Binaya, MD   1 drop at 01/16/24 1206   nutrition supplement (JUVEN) (JUVEN) powder packet 1 packet  1 packet Oral BID BM Willette Adriana LABOR, MD   1 packet at 02/28/24 9090   OLANZapine  zydis (ZYPREXA ) disintegrating tablet 5 mg  5 mg Oral BID PRN Akintayo, Musa A, MD   5 mg at 01/07/24  1343   ondansetron  (ZOFRAN ) injection 4 mg  4 mg Intravenous Q6H PRN Howerter, Justin B, DO   4 mg at 01/27/24 2336   Oral care mouth rinse  15 mL Mouth Rinse PRN Shahmehdi, Seyed A, MD       senna-docusate (Senokot-S) tablet 1 tablet  1 tablet Oral QHS PRN Patel, Pranav M, MD       tamsulosin  (FLOMAX ) capsule 0.4 mg  0.4 mg Oral Daily Arlon Honey W, DO   0.4 mg at 02/28/24 9090   urea  (URE-NA) oral packet 15 g  15 g Oral BID Arlon Honey ORN, DO   15 g at 02/28/24 9089     Discharge Medications: Allergies as of 02/28/2024   No Active Allergies         Medication List       STOP taking these medications     magnesium  oxide 400 (240 Mg) MG tablet Commonly known as: MAG-OX    metoprolol  succinate 25 MG 24 hr tablet Commonly known as: TOPROL -XL    pantoprazole  40 MG tablet Commonly known as: PROTONIX            TAKE these medications     acetaminophen  500 MG tablet Commonly known as: TYLENOL  Take 1 tablet (500 mg total) by mouth every 8 (eight) hours as needed. What changed:  how much to take when to  take this reasons to take this    amiodarone  200 MG tablet Commonly known as: PACERONE  Take 1 tablet (200 mg total) by mouth 2 (two) times daily.    apixaban  5 MG Tabs tablet Commonly known as: ELIQUIS  Take 1 tablet (5 mg total) by mouth 2 (two) times daily.    famotidine  20 MG tablet Commonly known as: PEPCID  Take 1 tablet (20 mg total) by mouth 2 (two) times daily.    ferrous sulfate  325 (65 FE) MG tablet Take 1 tablet (325 mg total) by mouth daily with breakfast.    folic acid  1 MG tablet Commonly known as: FOLVITE  Take 1 tablet (1 mg total) by mouth daily.    Gerhardt's butt cream Crea Apply 1 Application topically 2 (two) times daily.    magnesium  chloride 64 MG Tbec SR tablet Commonly known as: SLOW-MAG Take 2 tablets (128 mg total) by mouth daily. Start taking on: February 29, 2024    midodrine  5 MG tablet Commonly known as: PROAMATINE  Take 1 tablet (5 mg total) by mouth 3 (three) times daily with meals.    mometasone -formoterol  200-5 MCG/ACT Aero Commonly known as: DULERA  Inhale 2 puffs into the lungs 2 (two) times daily.    multivitamin with minerals Tabs tablet Take 1 tablet by mouth daily. Start taking on: February 29, 2024    nutrition supplement (JUVEN) Pack Take 1 packet by mouth 2 (two) times daily between meals.    OLANZapine  5 MG tablet Commonly known as: ZYPREXA  Take 1 tablet (5 mg total) by mouth at bedtime. What changed: Another medication with the same name was removed. Continue taking this medication, and follow the directions you see here.    rosuvastatin  10 MG tablet Commonly known as: CRESTOR  Take 1 tablet (10 mg total) by mouth daily.    senna-docusate 8.6-50 MG tablet Commonly known as: Senokot-S Take 1 tablet by mouth at bedtime as needed for mild constipation.    tamsulosin  0.4 MG Caps capsule Commonly known as: FLOMAX  Take 1 capsule (0.4 mg total) by mouth daily. Start taking on: February 29, 2024    thiamine  100  MG  tablet Commonly known as: VITAMIN B1 Take 1 tablet (100 mg total) by mouth daily.    urea  15 g Pack oral packet Commonly known as: URE-NA Take 15 g by mouth 2 (two) times daily.       Relevant Imaging Results:  Relevant Lab Results:   Additional Information [id number]  Anthony JONELLE Joe, RN

## 2024-02-28 NOTE — Plan of Care (Signed)

## 2024-02-28 NOTE — TOC Transition Note (Addendum)
 Transition of Care [facility]) - Discharge Note   Patient Details  Name: Anthony A Golladay Jr. MRN: [id number] Date of Birth: 02-26-1956  Transition of Care [facility]) CM/SW Contact:  Rosaline JONELLE Joe, RN Phone Number: 02/28/2024, 10:10 AM   Clinical Narrative:    CM called and spoke with Rosana, Family [facility] home owner and he plans to pick the patient up for admission to the facility today.  I called and spoke with MD and asked that meds be filled in [facility] pharmacy and be ready for delivery in the next hour.  Patient has RW available to the hospital room.  Patient is aware of pending discharge.  Copy of FL2 and discharge summary to be provided in the discharge packet for the facility.  Discharge summary, copy of FL2 and TB skin results included in the packet for the ALF.  I called Rosana, owner of ALF and he plans to pick the patient up for discharge today by car.  Patient to be sent home with RW as well.    Final next level of care: Skilled Nursing Facility Barriers to Discharge: Continued Medical Work up   Patient Goals and CMS Choice     Choice offered to / list presented to : Sibling (brother [place])      Discharge Placement                       Discharge Plan and Services Additional resources added to the After Visit Summary for   In-house Referral: Clinical Social Work                                   Social Drivers of Health (SDOH) Interventions SDOH Screenings   Food Insecurity: Patient Unable To Answer (12/09/2023)  Housing: Patient Unable To Answer (12/09/2023)  Transportation Needs: Patient Unable To Answer (12/09/2023)  Utilities: Patient Unable To Answer (12/09/2023)  Depression (PHQ2-9): Medium Risk (10/16/2020)  Social Connections: Unknown (12/22/2023)  Tobacco Use: High Risk (12/08/2023)     Readmission Risk Interventions    01/14/2024    9:48 AM 08/27/2023   12:21 PM 08/19/2023    9:17 AM  Readmission Risk Prevention Plan  Transportation  Screening Complete Complete Complete  Medication Review Oceanographer) Complete Complete Complete  PCP or Specialist appointment within 3-5 days of discharge Complete Complete   HRI or Home Care Consult Complete Complete Complete  SW Recovery Care/Counseling Consult Complete Complete Complete  Palliative Care Screening Not Applicable Not Applicable Not Applicable  Skilled Nursing Facility Not Applicable Not Applicable Not Applicable

## 2024-02-29 ENCOUNTER — Other Ambulatory Visit (HOSPITAL_COMMUNITY): Payer: Self-pay

## 2024-03-06 ENCOUNTER — Other Ambulatory Visit (HOSPITAL_COMMUNITY): Payer: Self-pay

## 2024-04-06 ENCOUNTER — Other Ambulatory Visit: Payer: Self-pay

## 2024-04-06 ENCOUNTER — Emergency Department (HOSPITAL_COMMUNITY)
Admission: EM | Admit: 2024-04-06 | Discharge: 2024-04-07 | Disposition: A | Discharge status: Home / Self Care | Attending: Emergency Medicine | Admitting: Emergency Medicine

## 2024-04-06 DIAGNOSIS — F10920 Alcohol use, unspecified with intoxication, uncomplicated: Secondary | ICD-10-CM | POA: Diagnosis present

## 2024-04-06 DIAGNOSIS — Y908 Blood alcohol level of 240 mg/100 ml or more: Secondary | ICD-10-CM | POA: Diagnosis not present

## 2024-04-06 DIAGNOSIS — Z7901 Long term (current) use of anticoagulants: Secondary | ICD-10-CM | POA: Diagnosis not present

## 2024-04-06 DIAGNOSIS — Z79899 Other long term (current) drug therapy: Secondary | ICD-10-CM | POA: Diagnosis not present

## 2024-04-06 LAB — COMPREHENSIVE METABOLIC PANEL WITH GFR
ALT: 55 U/L — ABNORMAL HIGH (ref 0–44)
AST: 91 U/L — ABNORMAL HIGH (ref 15–41)
Albumin: 3.8 g/dL (ref 3.5–5.0)
Alkaline Phosphatase: 114 U/L (ref 38–126)
Anion gap: 15 (ref 5–15)
BUN: 16 mg/dL (ref 8–23)
CO2: 16 mmol/L — ABNORMAL LOW (ref 22–32)
Calcium: 8.5 mg/dL — ABNORMAL LOW (ref 8.9–10.3)
Chloride: 113 mmol/L — ABNORMAL HIGH (ref 98–111)
Creatinine, Ser: 0.88 mg/dL (ref 0.61–1.24)
GFR, Estimated: >60 mL/min (ref >60)
Glucose, Bld: 85 mg/dL (ref 70–99)
Potassium: 3.5 mmol/L (ref 3.5–5.1)
Sodium: 144 mmol/L (ref 135–145)
Total Bilirubin: 0.7 mg/dL (ref 0.0–1.2)
Total Protein: 7.6 g/dL (ref 6.5–8.1)

## 2024-04-06 LAB — CBC
HCT: 37.8 % — ABNORMAL LOW (ref 39.0–52.0)
Hemoglobin: 12.2 g/dL — ABNORMAL LOW (ref 13.0–17.0)
MCH: 33.2 pg (ref 26.0–34.0)
MCHC: 32.3 g/dL (ref 30.0–36.0)
MCV: 102.7 fL — ABNORMAL HIGH (ref 80.0–100.0)
Platelets: 222 K/uL (ref 150–400)
RBC: 3.68 MIL/uL — ABNORMAL LOW (ref 4.22–5.81)
RDW: 16.1 % — ABNORMAL HIGH (ref 11.5–15.5)
WBC: 5.4 K/uL (ref 4.0–10.5)
nRBC: 0 % (ref 0.0–0.2)

## 2024-04-06 LAB — ETHANOL: Alcohol, Ethyl (B): 350 mg/dL (ref <15)

## 2024-04-06 NOTE — ED Notes (Addendum)
 Pt provided sandwich bag and apple juice per MD .

## 2024-04-06 NOTE — ED Provider Notes (Signed)
 [place] EMERGENCY DEPARTMENT AT [facility] Provider Note   CSN: [id number] Arrival date & time: 04/06/24  2103     Patient presents with: Alcohol  Intoxication   Anthony A Solivan Mickey. is a 68 y.o. male.   HPI    68 year old male comes in with chief complaint of alcohol  intoxication.  Patient states that he called 911, because he could not stand up by himself.  Patient normally resides at [facility].  He admits to drinking vodka today.  He feels better now and wants to go home.  He denies any trauma, chest pain, headache, back pain, leg pain.  Prior to Admission medications   Medication Sig Start Date End Date Taking? Authorizing Provider  acetaminophen  (TYLENOL ) 500 MG tablet Take 1 tablet (500 mg total) by mouth every 8 (eight) hours as needed. Patient taking differently: Take 1,000 mg by mouth every 6 (six) hours as needed (for pain). 10/13/22   Gherghe, Costin M, MD  amiodarone  (PACERONE ) 200 MG tablet Take 1 tablet (200 mg total) by mouth daily. 02/28/24   Dino Antu, MD  apixaban  (ELIQUIS ) 5 MG TABS tablet Take 1 tablet (5 mg total) by mouth 2 (two) times daily. 02/28/24 04/28/24  Rashid, Farhan, MD  famotidine  (PEPCID ) 20 MG tablet Take 1 tablet (20 mg total) by mouth 2 (two) times daily. 02/28/24   Dino Antu, MD  ferrous sulfate  325 (65 FE) MG tablet Take 1 tablet (325 mg total) by mouth daily with breakfast. 02/28/24 03/29/24  Rashid, Farhan, MD  folic acid  (FOLVITE ) 1 MG tablet Take 1 tablet (1 mg total) by mouth daily. 02/28/24   Rashid, Farhan, MD  magnesium  chloride (SLOW-MAG) 64 MG TBEC SR tablet Take 2 tablets (128 mg total) by mouth daily. 02/29/24   Rashid, Farhan, MD  midodrine  (PROAMATINE ) 5 MG tablet Take 1 tablet (5 mg total) by mouth 3 (three) times daily with meals. 02/28/24   Rashid, Farhan, MD  mometasone -formoterol  (DULERA ) 200-5 MCG/ACT AERO Inhale 2 puffs into the lungs 2 (two) times daily. 02/28/24   Rashid, Farhan, MD  Multiple Vitamin (MULTIVITAMIN  WITH MINERALS) TABS tablet Take 1 tablet by mouth daily. 02/29/24   Rashid, Farhan, MD  nutrition supplement, JUVEN, (JUVEN) PACK Take 1 packet by mouth 2 (two) times daily between meals. 02/28/24   Rashid, Farhan, MD  Nystatin  (GERHARDT'S BUTT CREAM) CREA Apply 1 Application topically 2 (two) times daily. 02/28/24   Rashid, Farhan, MD  OLANZapine  (ZYPREXA ) 5 MG tablet Take 1 tablet (5 mg total) by mouth at bedtime. 02/28/24   Rashid, Farhan, MD  rosuvastatin  (CRESTOR ) 10 MG tablet Take 1 tablet (10 mg total) by mouth daily. 02/28/24 05/28/24  Rashid, Farhan, MD  senna-docusate (SENOKOT-S) 8.6-50 MG tablet Take 1 tablet by mouth at bedtime as needed for mild constipation. 02/28/24   Rashid, Farhan, MD  tamsulosin  (FLOMAX ) 0.4 MG CAPS capsule Take 1 capsule (0.4 mg total) by mouth daily. 02/29/24   Rashid, Farhan, MD  thiamine  (VITAMIN B1) 100 MG tablet Take 1 tablet (100 mg total) by mouth daily. 02/28/24   Dino Antu, MD  urea  (URE-NA) 15 g PACK oral packet Take 15 g by mouth 2 (two) times daily. 02/28/24   Rashid, Farhan, MD    Allergies: Patient has no known allergies.    Review of Systems  All other systems reviewed and are negative.   Updated Vital Signs BP 101/69 (BP Location: Right Arm)   Pulse 94   Temp 98.7 F (37.1 C) (Oral)  Resp 18   Ht 5' 6 (1.676 m)   Wt 69 kg   SpO2 96%   BMI 24.55 kg/m   Physical Exam Vitals and nursing note reviewed.  Constitutional:      Appearance: He is well-developed.  HENT:     Head: Atraumatic.  Cardiovascular:     Rate and Rhythm: Normal rate.  Pulmonary:     Effort: Pulmonary effort is normal.  Musculoskeletal:     Cervical back: Neck supple.  Skin:    General: Skin is warm.  Neurological:     Mental Status: He is alert and oriented to person, place, and time.     Sensory: No sensory deficit.     Motor: No weakness.     Gait: Gait abnormal.     (all labs ordered are listed, but only abnormal results are displayed) Labs Reviewed   COMPREHENSIVE METABOLIC PANEL WITH GFR - Abnormal; Notable for the following components:      Result Value   Chloride 113 (*)    CO2 16 (*)    Calcium  8.5 (*)    AST 91 (*)    ALT 55 (*)    All other components within normal limits  ETHANOL - Abnormal; Notable for the following components:   Alcohol , Ethyl (B) 350 (*)    All other components within normal limits  CBC - Abnormal; Notable for the following components:   RBC 3.68 (*)    Hemoglobin 12.2 (*)    HCT 37.8 (*)    MCV 102.7 (*)    RDW 16.1 (*)    All other components within normal limits  RAPID URINE DRUG SCREEN, HOSP PERFORMED    EKG: None  Radiology: No results found.   Procedures   Medications Ordered in the ED - No data to display                                  Medical Decision Making Amount and/or Complexity of Data Reviewed Labs: ordered.   68 year old male comes in with chief complaint of alcohol  intoxication.  He states that he could not stand up, therefore called 911.  He admits to drinking vodka.  Patient is moving all 4 extremities, but he is ataxic with his gait.  He is clearly intoxicated. Patient is not safe to leave as he is not clinically sober.  Will monitor him until sober. No signs of trauma. Social determinant of health: Homelessness  Final diagnoses:  Alcoholic intoxication without complication [facility])    ED Discharge Orders     None          Charlyn Sora, MD 04/06/24 2308

## 2024-04-06 NOTE — ED Triage Notes (Signed)
 Pt's reports not being able to stand without assistance after drinking a pint of vodka today.

## 2024-04-06 NOTE — ED Notes (Signed)
 Pt given something to eat and drink. Also given a newspaper to read.

## 2024-04-07 LAB — RAPID URINE DRUG SCREEN, HOSP PERFORMED
Amphetamines: NOT DETECTED
Barbiturates: NOT DETECTED
Benzodiazepines: NOT DETECTED
Cocaine: NOT DETECTED
Opiates: NOT DETECTED
Tetrahydrocannabinol: NOT DETECTED

## 2024-06-30 ENCOUNTER — Other Ambulatory Visit: Payer: Self-pay

## 2024-06-30 ENCOUNTER — Encounter: Payer: Self-pay | Admitting: *Deleted

## 2024-06-30 ENCOUNTER — Emergency Department
Admission: EM | Admit: 2024-06-30 | Discharge: 2024-07-01 | Disposition: A | Discharge status: Skilled Nursing Facility | Attending: Emergency Medicine | Admitting: Emergency Medicine

## 2024-06-30 DIAGNOSIS — Y908 Blood alcohol level of 240 mg/100 ml or more: Secondary | ICD-10-CM | POA: Insufficient documentation

## 2024-06-30 DIAGNOSIS — F10920 Alcohol use, unspecified with intoxication, uncomplicated: Secondary | ICD-10-CM | POA: Insufficient documentation

## 2024-06-30 DIAGNOSIS — I454 Nonspecific intraventricular block: Secondary | ICD-10-CM | POA: Diagnosis not present

## 2024-06-30 DIAGNOSIS — Z7901 Long term (current) use of anticoagulants: Secondary | ICD-10-CM | POA: Insufficient documentation

## 2024-06-30 DIAGNOSIS — I4891 Unspecified atrial fibrillation: Secondary | ICD-10-CM | POA: Insufficient documentation

## 2024-06-30 DIAGNOSIS — W19XXXA Unspecified fall, initial encounter: Secondary | ICD-10-CM | POA: Diagnosis not present

## 2024-06-30 DIAGNOSIS — S0990XA Unspecified injury of head, initial encounter: Secondary | ICD-10-CM | POA: Insufficient documentation

## 2024-06-30 DIAGNOSIS — S12001A Unspecified nondisplaced fracture of first cervical vertebra, initial encounter for closed fracture: Secondary | ICD-10-CM | POA: Insufficient documentation

## 2024-06-30 DIAGNOSIS — S199XXA Unspecified injury of neck, initial encounter: Secondary | ICD-10-CM | POA: Diagnosis present

## 2024-06-30 MED ORDER — LACTATED RINGERS IV BOLUS: 1000.0000 mL | Freq: Once | INTRAVENOUS | Status: DC

## 2024-06-30 NOTE — ED Triage Notes (Signed)
 Pt arrives via [facility] from group home. Per EMS report, the staff reported that he started drinking since this morning, had drank 1/2 gallon of whiskey and 4 natty daddy beers. He sustained a fall unwitnessed, no injury,they found him in the floor. Vitals en route,180/116, HR 120, gave 500 LR, IV established in the right forearm, bolus administered en route, Repeat vitals 148/100, HR 100, afib on the monitor. 98% RA. CO 2 28, CBG 115. On Eliquis  per [facility]

## 2024-06-30 NOTE — ED Triage Notes (Signed)
 Pt is unsure if he fell or not, I just know I was on the floor and could not get up, so I called for help. He says that he has had trouble breathing. He says he has been drinking the whiskey all week. Normally drinks a 6 pack a day, says he does not getting any medication at the group home so I self medicate.

## 2024-06-30 NOTE — ED Provider Notes (Addendum)
 [facility] Provider Note    Event Date/Time   First MD Initiated Contact with Patient 06/30/24 2346     (approximate)  History   Chief Complaint: Fall  HPI  Anthony A Smail Mickey. is a 68 y.o. male with a past medical history of alcohol  use, presents to the emergency department after possible fall.  According to the patient he admits to significant alcohol  use today, states he was in bed and then wind up on the floor he is not sure if he fell or rolled off the bed.  He states he was unable to get back up so they called EMS to bring the patient to the emergency department.  Patient states he stays at a group home.  Physical Exam   Triage Vital Signs: ED Triage Vitals  Encounter Vitals Group     BP 06/30/24 2335 (!) 146/96     Girls Systolic BP Percentile --      Girls Diastolic BP Percentile --      Boys Systolic BP Percentile --      Boys Diastolic BP Percentile --      Pulse Rate 06/30/24 2335 99     Resp 06/30/24 2335 (!) 22     Temp 06/30/24 2335 97.9 F (36.6 C)     Temp Source 06/30/24 2335 Oral     SpO2 06/30/24 2335 99 %     Weight 06/30/24 2334 150 lb (68 kg)     Height 06/30/24 2334 5' 10 (1.778 m)     Head Circumference --      Peak Flow --      Pain Score 06/30/24 2334 4     Pain Loc --      Pain Education --      Exclude from Growth Chart --     Most recent vital signs: Vitals:   06/30/24 2335  BP: (!) 146/96  Pulse: 99  Resp: (!) 22  Temp: 97.9 F (36.6 C)  SpO2: 99%    General: Awake, no distress.  Answering questions appropriately.  Does admit to significant alcohol  use. CV:  Good peripheral perfusion.  Regular rate and rhythm  Resp:  Normal effort.  Equal breath sounds bilaterally.  Chest is nontender. Abd:  No distention.  Soft, nontender.  No rebound or guarding.  Abdomen is nontender. Other:  Good range of motion all extremities, no sign of fracture or dislocation.   ED Results / Procedures / Treatments   EKG  EKG  viewed and interpreted by myself shows atrial fibrillation at 94 bpm with a narrow QRS, normal axis, normal intervals, nonspecific ST changes.  RADIOLOGY  I have reviewed the CT head images.  No obvious bleed seen on my evaluation. Radiology confirms no acute finding on CT of the head.  CT cervical spine however shows a nondisplaced C1 anterior arch fracture.   MEDICATIONS ORDERED IN ED: Medications  lactated ringers  bolus 1,000 mL (has no administration in time range)     IMPRESSION / MDM / ASSESSMENT AND PLAN / ED COURSE  I reviewed the triage vital signs and the nursing notes.  Patient's presentation is most consistent with acute presentation with potential threat to life or bodily function.  Patient presents to the emergency department for alcohol  intoxication and a possible fall.  Patient does appear somewhat intoxicated but able to give a good history, able to answer questions appropriately.  Will check labs including ethanol level.  As the patient has a history  of atrial fibrillation on Eliquis  will obtain CT imaging of the head and C-spine as precaution.  No injuries identified on examination good range of motion in all extremities, no chest or abdominal tenderness.  Patient's labs have resulted showing a reassuring CBC, mild LFT elevation otherwise reassuring chemistry.  Patient's alcohol  level is elevated at 310.  Patient CT scans show reassuring CT head but a C1 anterior arch fracture.  Patient denies any neck pain.  Placed in a [place] J c-collar.  I spoke to Dr. Deatrice of neurosurgery who recommends a CTA and MRI, if no significant findings the patient can be discharged in a [place] J collar with follow-up in the office.  I discussed this with the patient who is agreeable to this plan of care as well.  CTA is negative for arterial injury.  MRI shows the C1 fracture but no spinal injury.  Neurosurgery will follow-up in the office.  Patient will need to maintain c-collar at all times  until cleared by neurosurgery.  Patient is agreeable.  Group home provider updated as well.  Patient remains neurologically intact.  No weakness numbness, 5/5 and equal strength and sensation in all extremities.  FINAL CLINICAL IMPRESSION(S) / ED DIAGNOSES   Fall Alcohol  intoxication C1 fracture  Note:  This document was prepared using Dragon voice recognition software and may include unintentional dictation errors.   Dorothyann Drivers, MD 07/01/24 9766    Dorothyann Drivers, MD 07/01/24 [phone]

## 2024-07-01 ENCOUNTER — Emergency Department

## 2024-07-01 LAB — COMPREHENSIVE METABOLIC PANEL WITH GFR
ALT: 60 U/L — ABNORMAL HIGH (ref 0–44)
AST: 91 U/L — ABNORMAL HIGH (ref 15–41)
Albumin: 3.9 g/dL (ref 3.5–5.0)
Alkaline Phosphatase: 100 U/L (ref 38–126)
Anion gap: 14 (ref 5–15)
BUN: 12 mg/dL (ref 8–23)
CO2: 19 mmol/L — ABNORMAL LOW (ref 22–32)
Calcium: 8.5 mg/dL — ABNORMAL LOW (ref 8.9–10.3)
Chloride: 99 mmol/L (ref 98–111)
Creatinine, Ser: 0.68 mg/dL (ref 0.61–1.24)
GFR, Estimated: >60 mL/min (ref >60)
Glucose, Bld: 90 mg/dL (ref 70–99)
Potassium: 4.2 mmol/L (ref 3.5–5.1)
Sodium: 131 mmol/L — ABNORMAL LOW (ref 135–145)
Total Bilirubin: 0.3 mg/dL (ref 0.0–1.2)
Total Protein: 6.9 g/dL (ref 6.5–8.1)

## 2024-07-01 LAB — CBC
HCT: 31.3 % — ABNORMAL LOW (ref 39.0–52.0)
Hemoglobin: 10.6 g/dL — ABNORMAL LOW (ref 13.0–17.0)
MCH: 35.1 pg — ABNORMAL HIGH (ref 26.0–34.0)
MCHC: 33.9 g/dL (ref 30.0–36.0)
MCV: 103.6 fL — ABNORMAL HIGH (ref 80.0–100.0)
Platelets: 189 K/uL (ref 150–400)
RBC: 3.02 MIL/uL — ABNORMAL LOW (ref 4.22–5.81)
RDW: 15.3 % (ref 11.5–15.5)
WBC: 5.1 K/uL (ref 4.0–10.5)
nRBC: 0 % (ref 0.0–0.2)

## 2024-07-01 LAB — ETHANOL: Alcohol, Ethyl (B): 310 mg/dL (ref <15)

## 2024-07-01 MED ORDER — IOHEXOL 350 MG/ML SOLN
75.0000 mL | Freq: Once | INTRAVENOUS | Status: AC | PRN
  Administered 2024-07-01: 75 mL via INTRAVENOUS

## 2024-07-01 NOTE — Discharge Instructions (Addendum)
 Please wear your cervical collar at all times.  Please call the number provided for neurosurgery, they will arrange a follow-up appointment for you in approximately 1 week.  Return to the emergency department for any increased neck pain any weakness or numbness of any arm or leg or any symptom personally concerning to yourself.

## 2024-07-01 NOTE — ED Notes (Signed)
 RN contacted patient's group home, [facility], and verified residence of pt. RN was then referred to contact [place], [phone], who was called and made aware that the patients workup is still in progress. Rosana is the overnight transporter for this patient. All parties aware of patient status in ED.

## 2024-07-01 NOTE — ED Notes (Signed)
 Patient transported to CT

## 2024-07-01 NOTE — ED Notes (Signed)
 Assisted RN change pt sheets and put pt into a gown at this time. Applied hospital socks on pt.

## 2024-07-01 NOTE — ED Notes (Addendum)
 Pt ABCs intact. RR even and unlabored. Pt in NAD. Pt denies further needs from this RN. Patient being prepared to leave with caregiver, Rosana, who is the overnight transporter to the patient's Group Home.

## 2024-07-01 NOTE — ED Notes (Signed)
 Pt ABCs intact. RR even and unlabored. Pt in NAD. Bed in lowest locked position. Call bell in reach. Denies needs at this time.
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
## 2024-07-01 NOTE — ED Notes (Signed)
 RN attempted to contact patient's emergency contact and brother, Jeralyn Lizak x3 with no answer. A voicemail was left on Pete's phone with update of patient and plan to follow up outpatient. Pt Group Home has been contacted and is aware of his discharge. Clement, patient transporter, aware and is en route to pickup patient.

## 2024-07-01 NOTE — ED Notes (Signed)
 Pt discharged to ED circle at this time and left with all belongings. Pt ABCs intact. RR even and unlabored. Pt in NAD. Pt denies further needs from this RN. Patient leaving with Rosana, Group Home transporter. Facility aware of discharge and is expecting patient. Anthony [name], brother and emergency contact of patient called and voicemail left.

## 2024-07-03 ENCOUNTER — Telehealth: Payer: Self-pay

## 2024-07-03 NOTE — Telephone Encounter (Signed)
 The ER called Dr Deatrice about this patient when he was covering call for us  on 06/30/24.   Per Dr Deatrice: 68RHM no sig PMH EtOH fall, with CT C spine with a fracture of anterior arch of C1 on right at the junction with the R lateral mass.  No additional fracture.  CTA neck neg for dissection, MRI C spine with degen changes, no other STIR. 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 PLAN: C collar Will need outpatient f/u

## 2024-07-04 NOTE — Telephone Encounter (Signed)
 Per note from the ER:  Patient will need to maintain c-collar at all times until cleared by neurosurgery. Patient is agreeable. Group home provider updated as well.  Florence Grave, group home administrator, to relay this information. Advised to wear collar until follow up 08/07/24.

## 2024-07-04 NOTE — Telephone Encounter (Signed)
 Rosana, the Group Home Administrator, called to ask if the neck brace can be removed before the next appointment. The ER advised waiting until the patient is seen by a doctor, but Rosana is wondering if the brace needs to stay on for a full month.

## 2024-07-27 ENCOUNTER — Other Ambulatory Visit: Payer: Self-pay | Admitting: Family Medicine

## 2024-07-27 DIAGNOSIS — M439 Deforming dorsopathy, unspecified: Secondary | ICD-10-CM
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
## 2024-08-07 ENCOUNTER — Ambulatory Visit (INDEPENDENT_AMBULATORY_CARE_PROVIDER_SITE_OTHER)

## 2024-08-07 ENCOUNTER — Other Ambulatory Visit: Payer: Self-pay | Admitting: Neurosurgery

## 2024-08-07 ENCOUNTER — Ambulatory Visit: Admitting: Neurosurgery

## 2024-08-07 ENCOUNTER — Ambulatory Visit

## 2024-08-07 VITALS — BP 124/84 | Wt 159.4 lb

## 2024-08-07 DIAGNOSIS — S12001A Unspecified nondisplaced fracture of first cervical vertebra, initial encounter for closed fracture: Secondary | ICD-10-CM

## 2024-08-07 DIAGNOSIS — G959 Disease of spinal cord, unspecified: Secondary | ICD-10-CM

## 2024-08-07 DIAGNOSIS — Z09 Encounter for follow-up examination after completed treatment for conditions other than malignant neoplasm: Secondary | ICD-10-CM

## 2024-08-07 DIAGNOSIS — M439 Deforming dorsopathy, unspecified: Secondary | ICD-10-CM

## 2024-08-07 DIAGNOSIS — S12001D Unspecified nondisplaced fracture of first cervical vertebra, subsequent encounter for fracture with routine healing: Secondary | ICD-10-CM | POA: Insufficient documentation

## 2024-08-12 ENCOUNTER — Ambulatory Visit: Payer: Self-pay | Admitting: Neurosurgery

## 2024-08-12 NOTE — Progress Notes (Signed)
 No evidence of instability noted on dynamic x-rays.  Okay to wear collar as needed for pain.
